# Patient Record
Sex: Female | Born: 1935 | Race: White | Hispanic: No | Smoking: Former smoker
Health system: Southern US, Community
[De-identification: ages and names within clinical notes are randomized; demographics above are authoritative.]

## PROBLEM LIST (undated history)

## (undated) DIAGNOSIS — K5903 Drug induced constipation: Secondary | ICD-10-CM

## (undated) DIAGNOSIS — M545 Low back pain, unspecified: Secondary | ICD-10-CM

## (undated) DIAGNOSIS — I341 Nonrheumatic mitral (valve) prolapse: Secondary | ICD-10-CM

## (undated) DIAGNOSIS — F419 Anxiety disorder, unspecified: Secondary | ICD-10-CM

## (undated) DIAGNOSIS — M549 Dorsalgia, unspecified: Secondary | ICD-10-CM

## (undated) DIAGNOSIS — Z87440 Personal history of urinary (tract) infections: Secondary | ICD-10-CM

## (undated) DIAGNOSIS — R5381 Other malaise: Secondary | ICD-10-CM

## (undated) DIAGNOSIS — W19XXXA Unspecified fall, initial encounter: Secondary | ICD-10-CM

## (undated) DIAGNOSIS — T402X5A Adverse effect of other opioids, initial encounter: Secondary | ICD-10-CM

## (undated) DIAGNOSIS — D126 Benign neoplasm of colon, unspecified: Secondary | ICD-10-CM

## (undated) DIAGNOSIS — R269 Unspecified abnormalities of gait and mobility: Secondary | ICD-10-CM

## (undated) DIAGNOSIS — F32A Depression, unspecified: Secondary | ICD-10-CM

## (undated) DIAGNOSIS — R1310 Dysphagia, unspecified: Secondary | ICD-10-CM

## (undated) DIAGNOSIS — M3500 Sicca syndrome, unspecified: Secondary | ICD-10-CM

## (undated) DIAGNOSIS — I959 Hypotension, unspecified: Secondary | ICD-10-CM

## (undated) DIAGNOSIS — F329 Major depressive disorder, single episode, unspecified: Secondary | ICD-10-CM

## (undated) DIAGNOSIS — E041 Nontoxic single thyroid nodule: Secondary | ICD-10-CM

## (undated) DIAGNOSIS — K5730 Diverticulosis of large intestine without perforation or abscess without bleeding: Secondary | ICD-10-CM

## (undated) DIAGNOSIS — G8929 Other chronic pain: Secondary | ICD-10-CM

## (undated) DIAGNOSIS — F0390 Unspecified dementia without behavioral disturbance: Secondary | ICD-10-CM

## (undated) DIAGNOSIS — I480 Paroxysmal atrial fibrillation: Secondary | ICD-10-CM

## (undated) DIAGNOSIS — R413 Other amnesia: Secondary | ICD-10-CM

## (undated) DIAGNOSIS — Z8679 Personal history of other diseases of the circulatory system: Secondary | ICD-10-CM

## (undated) DIAGNOSIS — Z8719 Personal history of other diseases of the digestive system: Secondary | ICD-10-CM

## (undated) DIAGNOSIS — Z889 Allergy status to unspecified drugs, medicaments and biological substances status: Secondary | ICD-10-CM

## (undated) DIAGNOSIS — J309 Allergic rhinitis, unspecified: Secondary | ICD-10-CM

## (undated) DIAGNOSIS — K219 Gastro-esophageal reflux disease without esophagitis: Secondary | ICD-10-CM

## (undated) DIAGNOSIS — H04129 Dry eye syndrome of unspecified lacrimal gland: Secondary | ICD-10-CM

## (undated) DIAGNOSIS — G629 Polyneuropathy, unspecified: Secondary | ICD-10-CM

## (undated) DIAGNOSIS — M797 Fibromyalgia: Secondary | ICD-10-CM

## (undated) DIAGNOSIS — F341 Dysthymic disorder: Secondary | ICD-10-CM

## (undated) DIAGNOSIS — I10 Essential (primary) hypertension: Secondary | ICD-10-CM

## (undated) DIAGNOSIS — K589 Irritable bowel syndrome without diarrhea: Secondary | ICD-10-CM

## (undated) DIAGNOSIS — E119 Type 2 diabetes mellitus without complications: Secondary | ICD-10-CM

## (undated) DIAGNOSIS — I6389 Other cerebral infarction: Secondary | ICD-10-CM

## (undated) HISTORY — DX: Major depressive disorder, single episode, unspecified: F32.9

## (undated) HISTORY — DX: Personal history of other diseases of the circulatory system: Z86.79

## (undated) HISTORY — DX: Nonrheumatic mitral (valve) prolapse: I34.1

## (undated) HISTORY — DX: Unspecified abnormalities of gait and mobility: R26.9

## (undated) HISTORY — DX: Unspecified dementia, unspecified severity, without behavioral disturbance, psychotic disturbance, mood disturbance, and anxiety: F03.90

## (undated) HISTORY — PX: APPENDECTOMY: SHX54

## (undated) HISTORY — DX: Allergic rhinitis, unspecified: J30.9

## (undated) HISTORY — PX: DENTAL SURGERY: SHX609

## (undated) HISTORY — DX: Low back pain: M54.5

## (undated) HISTORY — PX: CATARACT EXTRACTION, BILATERAL: SHX1313

## (undated) HISTORY — DX: Dorsalgia, unspecified: M54.9

## (undated) HISTORY — DX: Irritable bowel syndrome, unspecified: K58.9

## (undated) HISTORY — DX: Depression, unspecified: F32.A

## (undated) HISTORY — DX: Other amnesia: R41.3

## (undated) HISTORY — DX: Type 2 diabetes mellitus without complications: E11.9

## (undated) HISTORY — PX: SINUS EXPLORATION: SHX5214

## (undated) HISTORY — DX: Paroxysmal atrial fibrillation: I48.0

## (undated) HISTORY — DX: Allergy status to unspecified drugs, medicaments and biological substances: Z88.9

## (undated) HISTORY — DX: Benign neoplasm of colon, unspecified: D12.6

## (undated) HISTORY — DX: Dysthymic disorder: F34.1

## (undated) HISTORY — DX: Nontoxic single thyroid nodule: E04.1

## (undated) HISTORY — DX: Low back pain, unspecified: M54.50

## (undated) HISTORY — DX: Unspecified fall, initial encounter: W19.XXXA

## (undated) HISTORY — DX: Diverticulosis of large intestine without perforation or abscess without bleeding: K57.30

## (undated) HISTORY — DX: Sjogren syndrome, unspecified: M35.00

## (undated) HISTORY — DX: Anxiety disorder, unspecified: F41.9

## (undated) HISTORY — PX: COLONOSCOPY W/ BIOPSIES: SHX1374

## (undated) HISTORY — DX: Fibromyalgia: M79.7

## (undated) HISTORY — DX: Drug induced constipation: K59.03

## (undated) HISTORY — DX: Essential (primary) hypertension: I10

## (undated) HISTORY — DX: Adverse effect of other opioids, initial encounter: T40.2X5A

## (undated) HISTORY — DX: Gastro-esophageal reflux disease without esophagitis: K21.9

## (undated) HISTORY — DX: Dysphagia, unspecified: R13.10

## (undated) HISTORY — DX: Other chronic pain: G89.29

## (undated) HISTORY — DX: Dry eye syndrome of unspecified lacrimal gland: H04.129

## (undated) HISTORY — DX: Other malaise: R53.81

---

## 1965-02-13 HISTORY — PX: ABDOMINAL HYSTERECTOMY: SHX81

## 1978-02-13 HISTORY — PX: NASAL SEPTUM SURGERY: SHX37

## 1984-02-14 HISTORY — PX: TEMPOROMANDIBULAR JOINT SURGERY: SHX35

## 1997-07-09 ENCOUNTER — Ambulatory Visit (HOSPITAL_COMMUNITY): Admission: RE | Admit: 1997-07-09 | Discharge: 1997-07-09 | Payer: Self-pay | Admitting: Cardiology

## 1997-12-11 ENCOUNTER — Ambulatory Visit (HOSPITAL_COMMUNITY): Admission: RE | Admit: 1997-12-11 | Discharge: 1997-12-11 | Payer: Self-pay | Admitting: Gastroenterology

## 1997-12-11 ENCOUNTER — Encounter: Payer: Self-pay | Admitting: Gastroenterology

## 1998-02-11 ENCOUNTER — Inpatient Hospital Stay (HOSPITAL_COMMUNITY): Admission: EM | Admit: 1998-02-11 | Discharge: 1998-02-19 | Payer: Self-pay

## 1998-02-11 ENCOUNTER — Encounter: Payer: Self-pay | Admitting: Internal Medicine

## 1998-02-13 HISTORY — PX: CHOLECYSTECTOMY: SHX55

## 1998-02-16 ENCOUNTER — Encounter: Payer: Self-pay | Admitting: Internal Medicine

## 1998-08-23 ENCOUNTER — Ambulatory Visit (HOSPITAL_COMMUNITY): Admission: RE | Admit: 1998-08-23 | Discharge: 1998-08-23 | Payer: Self-pay | Admitting: Cardiology

## 1998-08-23 ENCOUNTER — Encounter: Payer: Self-pay | Admitting: Cardiology

## 1999-02-14 HISTORY — PX: TRANSTHORACIC ECHOCARDIOGRAM: SHX275

## 1999-05-27 ENCOUNTER — Inpatient Hospital Stay (HOSPITAL_COMMUNITY): Admission: EM | Admit: 1999-05-27 | Discharge: 1999-06-08 | Payer: Self-pay | Admitting: Emergency Medicine

## 1999-05-27 ENCOUNTER — Encounter: Payer: Self-pay | Admitting: Emergency Medicine

## 1999-05-30 ENCOUNTER — Encounter: Payer: Self-pay | Admitting: Cardiology

## 2000-02-13 ENCOUNTER — Encounter: Admission: RE | Admit: 2000-02-13 | Discharge: 2000-03-15 | Payer: Self-pay | Admitting: Orthopedic Surgery

## 2000-02-14 DIAGNOSIS — D126 Benign neoplasm of colon, unspecified: Secondary | ICD-10-CM

## 2000-02-14 HISTORY — DX: Benign neoplasm of colon, unspecified: D12.6

## 2000-11-01 ENCOUNTER — Encounter (INDEPENDENT_AMBULATORY_CARE_PROVIDER_SITE_OTHER): Payer: Self-pay | Admitting: Gastroenterology

## 2000-11-01 ENCOUNTER — Other Ambulatory Visit: Admission: RE | Admit: 2000-11-01 | Discharge: 2000-11-01 | Payer: Self-pay | Admitting: Gastroenterology

## 2000-11-01 ENCOUNTER — Encounter (INDEPENDENT_AMBULATORY_CARE_PROVIDER_SITE_OTHER): Payer: Self-pay | Admitting: Specialist

## 2000-12-19 ENCOUNTER — Encounter: Payer: Self-pay | Admitting: Gastroenterology

## 2000-12-19 ENCOUNTER — Encounter: Admission: RE | Admit: 2000-12-19 | Discharge: 2000-12-19 | Payer: Self-pay | Admitting: Gastroenterology

## 2001-02-13 HISTORY — PX: NM MYOCAR PERF WALL MOTION: HXRAD629

## 2001-03-28 ENCOUNTER — Ambulatory Visit (HOSPITAL_COMMUNITY): Admission: RE | Admit: 2001-03-28 | Discharge: 2001-03-28 | Payer: Self-pay | Admitting: Gastroenterology

## 2001-03-28 ENCOUNTER — Encounter: Payer: Self-pay | Admitting: Gastroenterology

## 2001-11-26 ENCOUNTER — Other Ambulatory Visit: Admission: RE | Admit: 2001-11-26 | Discharge: 2001-11-26 | Payer: Self-pay | Admitting: Obstetrics & Gynecology

## 2002-06-30 ENCOUNTER — Encounter: Payer: Self-pay | Admitting: Pulmonary Disease

## 2002-06-30 ENCOUNTER — Ambulatory Visit (HOSPITAL_COMMUNITY): Admission: RE | Admit: 2002-06-30 | Discharge: 2002-06-30 | Payer: Self-pay | Admitting: Pulmonary Disease

## 2003-02-17 ENCOUNTER — Ambulatory Visit (HOSPITAL_COMMUNITY): Admission: RE | Admit: 2003-02-17 | Discharge: 2003-02-17 | Payer: Self-pay | Admitting: Cardiology

## 2003-02-17 HISTORY — PX: CARDIAC CATHETERIZATION: SHX172

## 2003-09-09 ENCOUNTER — Ambulatory Visit (HOSPITAL_COMMUNITY): Admission: RE | Admit: 2003-09-09 | Discharge: 2003-09-09 | Payer: Self-pay | Admitting: Pulmonary Disease

## 2003-12-17 ENCOUNTER — Other Ambulatory Visit: Admission: RE | Admit: 2003-12-17 | Discharge: 2003-12-17 | Payer: Self-pay | Admitting: Obstetrics & Gynecology

## 2004-02-22 ENCOUNTER — Ambulatory Visit: Payer: Self-pay | Admitting: Pulmonary Disease

## 2004-02-26 ENCOUNTER — Ambulatory Visit: Payer: Self-pay | Admitting: Gastroenterology

## 2004-03-16 ENCOUNTER — Ambulatory Visit: Payer: Self-pay | Admitting: Gastroenterology

## 2004-03-24 ENCOUNTER — Ambulatory Visit (HOSPITAL_COMMUNITY): Admission: RE | Admit: 2004-03-24 | Discharge: 2004-03-24 | Payer: Self-pay | Admitting: Gastroenterology

## 2004-03-24 ENCOUNTER — Encounter (INDEPENDENT_AMBULATORY_CARE_PROVIDER_SITE_OTHER): Payer: Self-pay | Admitting: Gastroenterology

## 2004-04-04 ENCOUNTER — Ambulatory Visit: Payer: Self-pay | Admitting: Pulmonary Disease

## 2004-11-16 ENCOUNTER — Ambulatory Visit: Payer: Self-pay | Admitting: Pulmonary Disease

## 2005-07-18 ENCOUNTER — Ambulatory Visit: Payer: Self-pay | Admitting: Pulmonary Disease

## 2005-08-15 ENCOUNTER — Ambulatory Visit: Payer: Self-pay | Admitting: Pulmonary Disease

## 2005-10-19 ENCOUNTER — Ambulatory Visit: Payer: Self-pay | Admitting: Pulmonary Disease

## 2005-11-29 ENCOUNTER — Ambulatory Visit: Payer: Self-pay | Admitting: Gastroenterology

## 2006-01-16 ENCOUNTER — Ambulatory Visit: Payer: Self-pay | Admitting: Pulmonary Disease

## 2006-03-20 ENCOUNTER — Ambulatory Visit: Payer: Self-pay | Admitting: Pulmonary Disease

## 2006-03-21 ENCOUNTER — Ambulatory Visit: Payer: Self-pay | Admitting: Pulmonary Disease

## 2006-03-21 LAB — CONVERTED CEMR LAB
ALT: 15 units/L (ref 0–40)
AST: 18 units/L (ref 0–37)
Albumin: 3.5 g/dL (ref 3.5–5.2)
CO2: 34 meq/L — ABNORMAL HIGH (ref 19–32)
Calcium: 9.6 mg/dL (ref 8.4–10.5)
Cholesterol: 170 mg/dL (ref 0–200)
Creatinine, Ser: 0.9 mg/dL (ref 0.4–1.2)
GFR calc non Af Amer: 65 mL/min
HDL: 49.7 mg/dL (ref >39.0)
LDL Cholesterol: 85 mg/dL (ref 0–99)
Lymphocytes Relative: 30 % (ref 12.0–46.0)
MCV: 89.4 fL (ref 78.0–100.0)
Neutro Abs: 3.8 10*3/uL (ref 1.4–7.7)
Neutrophils Relative %: 59.8 % (ref 43.0–77.0)
RBC: 4.15 M/uL (ref 3.87–5.11)
Sodium: 141 meq/L (ref 135–145)
TSH: 1.98 microintl units/mL (ref 0.35–5.50)
Total Protein: 7.3 g/dL (ref 6.0–8.3)
VLDL: 35 mg/dL (ref 0–40)
WBC: 6.3 10*3/uL (ref 4.5–10.5)

## 2006-05-25 ENCOUNTER — Encounter: Admission: RE | Admit: 2006-05-25 | Discharge: 2006-05-25 | Payer: Self-pay | Admitting: Pediatrics

## 2007-01-07 ENCOUNTER — Ambulatory Visit: Payer: Self-pay | Admitting: Pulmonary Disease

## 2007-06-28 ENCOUNTER — Telehealth (INDEPENDENT_AMBULATORY_CARE_PROVIDER_SITE_OTHER): Payer: Self-pay | Admitting: *Deleted

## 2007-07-02 ENCOUNTER — Encounter: Payer: Self-pay | Admitting: Adult Health

## 2007-07-02 ENCOUNTER — Ambulatory Visit: Payer: Self-pay | Admitting: Internal Medicine

## 2007-07-02 DIAGNOSIS — I10 Essential (primary) hypertension: Secondary | ICD-10-CM | POA: Insufficient documentation

## 2007-07-02 DIAGNOSIS — K589 Irritable bowel syndrome without diarrhea: Secondary | ICD-10-CM | POA: Insufficient documentation

## 2007-07-02 DIAGNOSIS — G589 Mononeuropathy, unspecified: Secondary | ICD-10-CM | POA: Insufficient documentation

## 2007-07-02 DIAGNOSIS — M797 Fibromyalgia: Secondary | ICD-10-CM | POA: Insufficient documentation

## 2007-07-02 DIAGNOSIS — M545 Low back pain, unspecified: Secondary | ICD-10-CM | POA: Insufficient documentation

## 2007-07-02 DIAGNOSIS — K219 Gastro-esophageal reflux disease without esophagitis: Secondary | ICD-10-CM | POA: Insufficient documentation

## 2007-07-02 DIAGNOSIS — F341 Dysthymic disorder: Secondary | ICD-10-CM | POA: Insufficient documentation

## 2007-07-04 LAB — CONVERTED CEMR LAB
AST: 20 units/L (ref 0–37)
BUN: 12 mg/dL (ref 6–23)
Basophils Absolute: 0.1 10*3/uL (ref 0.0–0.1)
CO2: 30 meq/L (ref 19–32)
Chloride: 101 meq/L (ref 96–112)
GFR calc non Af Amer: 74 mL/min
HCT: 37 % (ref 36.0–46.0)
Hemoglobin: 12.7 g/dL (ref 12.0–15.0)
MCHC: 34.4 g/dL (ref 30.0–36.0)
Monocytes Absolute: 0.6 10*3/uL (ref 0.1–1.0)
Monocytes Relative: 6.7 % (ref 3.0–12.0)
Neutro Abs: 5.5 10*3/uL (ref 1.4–7.7)
Neutrophils Relative %: 64.3 % (ref 43.0–77.0)
Platelets: 265 10*3/uL (ref 150–400)
RBC: 4.07 M/uL (ref 3.87–5.11)
Sodium: 135 meq/L (ref 135–145)
Total Bilirubin: 0.5 mg/dL (ref 0.3–1.2)

## 2007-11-04 ENCOUNTER — Ambulatory Visit: Payer: Self-pay | Admitting: Pulmonary Disease

## 2007-11-05 DIAGNOSIS — I059 Rheumatic mitral valve disease, unspecified: Secondary | ICD-10-CM | POA: Insufficient documentation

## 2007-11-05 DIAGNOSIS — R413 Other amnesia: Secondary | ICD-10-CM | POA: Insufficient documentation

## 2007-11-05 DIAGNOSIS — T887XXA Unspecified adverse effect of drug or medicament, initial encounter: Secondary | ICD-10-CM | POA: Insufficient documentation

## 2007-12-02 ENCOUNTER — Telehealth (INDEPENDENT_AMBULATORY_CARE_PROVIDER_SITE_OTHER): Payer: Self-pay | Admitting: *Deleted

## 2008-01-09 ENCOUNTER — Emergency Department (HOSPITAL_BASED_OUTPATIENT_CLINIC_OR_DEPARTMENT_OTHER): Admission: EM | Admit: 2008-01-09 | Discharge: 2008-01-09 | Payer: Self-pay | Admitting: Emergency Medicine

## 2008-01-15 ENCOUNTER — Encounter: Payer: Self-pay | Admitting: Pulmonary Disease

## 2008-02-09 ENCOUNTER — Ambulatory Visit: Payer: Self-pay | Admitting: Diagnostic Radiology

## 2008-02-09 ENCOUNTER — Emergency Department (HOSPITAL_BASED_OUTPATIENT_CLINIC_OR_DEPARTMENT_OTHER): Admission: EM | Admit: 2008-02-09 | Discharge: 2008-02-09 | Payer: Self-pay | Admitting: Emergency Medicine

## 2008-02-11 ENCOUNTER — Encounter: Admission: RE | Admit: 2008-02-11 | Discharge: 2008-02-11 | Payer: Self-pay | Admitting: Pediatrics

## 2008-02-18 ENCOUNTER — Telehealth (INDEPENDENT_AMBULATORY_CARE_PROVIDER_SITE_OTHER): Payer: Self-pay | Admitting: *Deleted

## 2008-03-03 ENCOUNTER — Ambulatory Visit: Payer: Self-pay | Admitting: Pulmonary Disease

## 2008-04-15 ENCOUNTER — Ambulatory Visit: Payer: Self-pay | Admitting: Internal Medicine

## 2008-04-15 DIAGNOSIS — Z860100 Personal history of colon polyps, unspecified: Secondary | ICD-10-CM | POA: Insufficient documentation

## 2008-04-16 ENCOUNTER — Telehealth: Payer: Self-pay | Admitting: Internal Medicine

## 2008-04-22 ENCOUNTER — Encounter: Payer: Self-pay | Admitting: Pulmonary Disease

## 2008-05-06 ENCOUNTER — Telehealth: Payer: Self-pay | Admitting: Internal Medicine

## 2008-05-20 ENCOUNTER — Telehealth: Payer: Self-pay | Admitting: Internal Medicine

## 2008-06-01 ENCOUNTER — Ambulatory Visit: Payer: Self-pay | Admitting: Internal Medicine

## 2008-06-08 ENCOUNTER — Ambulatory Visit: Payer: Self-pay | Admitting: Internal Medicine

## 2008-06-09 ENCOUNTER — Telehealth: Payer: Self-pay | Admitting: Internal Medicine

## 2008-09-01 ENCOUNTER — Telehealth: Payer: Self-pay | Admitting: Pulmonary Disease

## 2008-09-03 ENCOUNTER — Telehealth: Payer: Self-pay | Admitting: Pulmonary Disease

## 2009-01-06 ENCOUNTER — Ambulatory Visit: Payer: Self-pay | Admitting: Pulmonary Disease

## 2009-01-06 DIAGNOSIS — N3941 Urge incontinence: Secondary | ICD-10-CM | POA: Insufficient documentation

## 2009-01-12 ENCOUNTER — Encounter: Payer: Self-pay | Admitting: Pulmonary Disease

## 2009-02-24 ENCOUNTER — Encounter: Payer: Self-pay | Admitting: Pulmonary Disease

## 2009-03-22 ENCOUNTER — Encounter: Payer: Self-pay | Admitting: Pulmonary Disease

## 2009-04-08 ENCOUNTER — Telehealth (INDEPENDENT_AMBULATORY_CARE_PROVIDER_SITE_OTHER): Payer: Self-pay | Admitting: *Deleted

## 2009-07-06 ENCOUNTER — Ambulatory Visit: Payer: Self-pay | Admitting: Pulmonary Disease

## 2009-07-06 DIAGNOSIS — M3500 Sicca syndrome, unspecified: Secondary | ICD-10-CM | POA: Insufficient documentation

## 2009-07-06 DIAGNOSIS — H04129 Dry eye syndrome of unspecified lacrimal gland: Secondary | ICD-10-CM | POA: Insufficient documentation

## 2009-09-02 ENCOUNTER — Encounter: Payer: Self-pay | Admitting: Pulmonary Disease

## 2009-10-08 ENCOUNTER — Telehealth: Payer: Self-pay | Admitting: Pulmonary Disease

## 2009-10-26 ENCOUNTER — Encounter: Payer: Self-pay | Admitting: Pulmonary Disease

## 2009-11-05 ENCOUNTER — Encounter: Payer: Self-pay | Admitting: Pulmonary Disease

## 2009-11-25 ENCOUNTER — Telehealth (INDEPENDENT_AMBULATORY_CARE_PROVIDER_SITE_OTHER): Payer: Self-pay | Admitting: *Deleted

## 2009-12-03 ENCOUNTER — Encounter: Payer: Self-pay | Admitting: Pulmonary Disease

## 2010-01-03 ENCOUNTER — Ambulatory Visit: Payer: Self-pay | Admitting: Pulmonary Disease

## 2010-01-05 ENCOUNTER — Telehealth: Payer: Self-pay | Admitting: Pulmonary Disease

## 2010-03-15 NOTE — Assessment & Plan Note (Signed)
Summary: 6 months/apc   Primary Care Provider:  Alroy Dust, MD  CC:  6 month ROV & review of mult medical problems....  History of Present Illness: 77 y/o WF here for a follow up visit... he has multiple medical problems as noted below...    ~  Sep09:  she reports recent problems w/ her eyes and eval by DrHecker for Ophthal... s/p cataract surg 8/09 w/ complications-?Rosacea of eye & needed tubes in tear ducts, on several eye drops etc...   ~  Jan10:  she fell at home 12/27 (slipped on bathroom floor) and fractured her left thumb & bruised her face... seen at [facility] and then DrRendall w/ wrist/ thumb splint... this fall has exas her fibromyalgia to a degree but she is getting back to her baseline... she also saw DrLittle/ [facility] for yearly f/u MVP/ palpit/ mult med intolerances... she has not had the Seasonal flu shot, and refuses the H1N1 vaccine that is currently avail...  ~  Nov10:  she is here by herself today- daughter does her meds and she didn't bring list or bottles... she saw DrGessner 4/10 w/ constip- colonoscopy showed mod divertics, hems, & melanosis... he rec Miralax/ Senakot-S and improved... she has mult somatic complaints- ocular rosacea Dx by DrHecker & Tafeen... had bilat cat surg, now dry eye symptoms & she has upcoming appt w/ [facility] soon for 2nd opinion... she's noted LBP & saw DrJones prev (she will f/u w/ DrMortenson)... also notes urinary incontinence & she will f/u w/ DrNeal for this...   ~  Jul 06, 2009:  she recently saw DrDeveshwar for Rheum w/ extensive lab work done & Dx w/ Sjogren's syndrome/ autoimmune disease... her note is pending but pt indicates new Rx w/ Pilocarpine 5mg Bid, and considering Plaquenil rx...  she saw DrHecker 2/11 & DrLittle 1/11 - see below... she is c/o incr neuropathy symptoms in her legs and wonders what can be done for this> she is on Neurontin from DrHickling at 100mg Tid & we discussed incr to 300mg Bid...   Current Problem  List:  SJOGREN'S SYNDROME (ICD-710.2), & DRY EYE SYNDROME (ICD-375.15) - eval by DrHecker & DrDeveshwar 5/11 w/ extensive lab work done & Dx of Sjogren's syndrome & started on Pilocarpine 5mg Bid... DrHecker has her on a number of eye drops, & prev thought to be dry eyes w/ ocular rosacea & tubes placed in tear ducts...  CHRONIC ORAL DISCOMFORT - this has been looked into by mult physicians and no etiology discovered previously (there was a component of sicca syndrome)... she uses Magic Mouthwash Prn + sialogogues...  HYPERTENSION, BENIGN (ICD-401.1) - controlled on TOPROL XL 50mg - 1/2 tabBid now,  & DILTIAZEM CD 180mg /d...  BP= 110/78 today, takes meds regularly & tol well... denies CP, worsening palipit, syncope, dyspnea, edema, etc...  MITRAL VALVE PROLAPSE (ICD-424.0) - on ASA 81mg /d, & LANOXIN 0.125mg /d and followed by DrLittle for Cardiology... his notes are reviewed> seen 1/11 for her MVP, HBP, hx palpit,neg cath- Metoprolol incr to 25Bid.  ~  cath 1/05 by DrLittle showed clean coronaries & norm LVF w/ +MVP...  GERD (ICD-530.81) - on PREVACID 30mg /d... denies reflux symptoms, swallowing OK x for dryness, etc...  ~  last EGD 9/02 showed 4cmHH, GERD...  DIVERTICULOSIS OF COLON (ICD-562.10),  IRRITABLE BOWEL SYNDROME (ICD-564.1), & Family Hx of COLON CANCER (ICD-153.9) - followed for GI by DrSamLeBauer- now DrGessner- colonoscopy 2/06 showed a tortuous and spastic colon, divertics, otherw neg... her sister died in her 53's w/ colon cancer... f/u colonoscopy 4/10  showed mod divertics, hems, melanosis> Rx'd w/ Miralax/ Senakot-S.  URINARY INCONTINENCE (ICD-788.30) - eval by GYN= DrRNeal (on Premarin 0.9mg /d)...  LOW BACK PAIN SYNDROME (ICD-724.2) - on VICODIN up to 3/ day... Ortho= DrMortenson...  FIBROMYALGIA (ICD-729.1) - she has chr fatigue symptoms and diffuse aches & pains... on CYCLOBENZAPRINE 10mg - 1/2-1 tab Prn...  MEMORY LOSS (ICD-780.93) - eval by DrHickling for Neurology w/ MRI  done- pt states "it's not dementia, just hardening of the arteries"... his note from 4/08 indicates- OBS, gait abn & neck/ Back pain diagnoses...  NEUROPATHY (ICD-355.9) - on GABAPENTIN 100mg Tid per DrHickling, Neurology... she notes incr neuropathic symptoms and we decided to try incr NEURONTIN to 300mg Bid...  ANXIETY DEPRESSION (ICD-300.4) - she take ALPRAZOLAM 0.5mg Bid as needed... prev Psychiatric Rx from DrCottle but she hasn't seen him in >69yr...  Hx of ADVERSE DRUG REACTION (ICD-995.20) - hx of mult drug allergies and intolerances including:  PCN, Sulfa, Doxy, NSAIDs, Demerol, Codeine, DCN100, Tramadol, Phenergan, Thorazine, Stelazine...  NOTE: she takes Vicodin for pain, and tolerates this satis...  Health Maintenance - GYN= DrNeal on Premarin, Calcium, Vits, & Fish Oil...   Allergies: 1)  ! Penicillin 2)  ! * Stelazine 3)  ! * Phenothazine 4)  ! * Dolonil 5)  ! Septra 6)  ! Tolectin 7)  ! Naprosyn 8)  ! Doxycycline 9)  ! * Tramadol 10)  ! Darvon 11)  ! Codeine 12)  ! Demerol 13)  ! Tylox 14)  ! * Northex 15)  Amitiza (Lubiprostone)  Comments:  Nurse/Medical Assistant: The patient's medications and allergies were reviewed with the patient and were updated in the Medication and Allergy Lists.  Past History:  Past Medical History: SJOGREN'S SYNDROME (ICD-710.2) DRY EYE SYNDROME (ICD-375.15) * CHRONIC ORAL DISCOMFORT HYPERTENSION, BENIGN (ICD-401.1) MITRAL VALVE PROLAPSE (ICD-424.0) GERD (ICD-530.81) DIVERTICULOSIS OF COLON (ICD-562.10) IRRITABLE BOWEL SYNDROME (ICD-564.1) Family Hx of CARCINOMA, COLON, FAMILY HX (ICD-V16.0) URINARY INCONTINENCE (ICD-788.30) LOW BACK PAIN SYNDROME (ICD-724.2) FIBROMYALGIA (ICD-729.1) MEMORY LOSS (ICD-780.93) NEUROPATHY (ICD-355.9) ANXIETY DEPRESSION (ICD-300.4) Hx of ADVERSE DRUG REACTION (ICD-995.20)  Past Surgical History: S/P hysterectomy S/P appendectomy S/P cholecystectomy S/P mandible/ TMJ surgery right and left  cataract extractions  Family History: Reviewed history from 01/06/2009 and no changes required. Mother died age 54 due to heart problems Father died age 71 due to complications of pneumonia and MI Sister died age 38 due to cancer Brother alive at age 2 Brother deceased from cancer  Social History: Reviewed history from 01/06/2009 and no changes required. Widow 2 Children Min smoking hx (in her 79s) No alcohol Not exercising Retired  Review of Systems      See HPI       The patient complains of decreased hearing and dyspnea on exertion.  The patient denies anorexia, fever, weight loss, weight gain, vision loss, hoarseness, chest pain, syncope, peripheral edema, prolonged cough, headaches, hemoptysis, abdominal pain, melena, hematochezia, severe indigestion/heartburn, hematuria, incontinence, muscle weakness, suspicious skin lesions, transient blindness, difficulty walking, depression, unusual weight change, abnormal bleeding, enlarged lymph nodes, and angioedema.    Vital Signs:  Patient profile:   77 year old female Height:      63 inches Weight:      118 pounds BMI:     20.98 O2 Sat:      97 % on Room air Temp:     97.0 degrees F oral Pulse rate:   72 / minute BP sitting:   110 / 78  (right arm) Cuff size:   regular  Vitals  Entered By: Randell Loop CMA (Jul 06, 2009 2:22 PM)  O2 Sat at Rest %:  97 O2 Flow:  Room air CC: 6 month ROV & review of mult medical problems... Is Patient Diabetic? No Pain Assessment Patient in pain? no      Comments meds updated today per pt   Physical Exam  Additional Exam:  WD, WN, 77 y/o WF in NAD... she has dry eyes & prominent dry mouth symptoms... GENERAL:  Alert & oriented; pleasant & cooperative...  HEENT:  [place]/AT, EOM-wnl, PERRLA, EACs-clear, TMs-wnl, NOSE-clear, THROAT-clear & excessively dry. NECK:  Supple w/ fairROM; no JVD; normal carotid impulses w/o bruits; no thyromegaly or nodules palpated; no lymphadenopathy. CHEST:   Clear to P & A; without wheezes/ rales/ or rhonchi. HEART:  Regular Rhythm; without murmurs/ rubs/ or gallops heard... ABDOMEN:  Soft & nontender; normal bowel sounds; no organomegaly or masses detected. EXT: without deformities, mild arthritic changes; no varicose veins/ venous insuffic/ or edema. NEURO:  CN's intact;  no focal neuro deficits... DERM:  No lesions noted; no rash etc...    MISC. Report  Procedure date:  07/06/2009  Findings:      DATA REVIEWED:   ~  Note from DrLittle yearly OV assessment 1/11 is reviewed...  ~  Note from [facility] 2/11 reviewed...  ~  We are pending review of note from DrDeveshwar re: Sjogren's.   Impression & Recommendations:  Problem # 1:  SJOGREN'S SYNDROME (ICD-710.2) She has been Dx w/ Sjogren's sicca syndrome w/ dry eyes, dry mouth, and evid autoimmune dis per pt's report from DrDeveshwar (note pending)... she has been started on Pilocarpine 5mg Bid, along w/ numerous eye drops from DrHecker... she will maintain f/u w/ these physicians...  Problem # 2:  HYPERTENSION, BENIGN (ICD-401.1) Controlled>  continue same meds. Her updated medication list for this problem includes:    Toprol Xl 50 Mg Tb24 (Metoprolol succinate) .Marland Kitchen... 1/2 tab by mouth two times a day...    Cartia Xt 180 Mg Cp24 (Diltiazem hcl coated beads) .Marland Kitchen... Take 1 tablet by mouth once a day  Problem # 3:  MITRAL VALVE PROLAPSE (ICD-424.0) Followed by DrLittle>  1/11 note reviewed... Her updated medication list for this problem includes:    Bayer Aspirin Ec Low Dose 81 Mg Tbec (Aspirin) .Marland Kitchen... Take 1 tablet by mouth once a day    Toprol Xl 50 Mg Tb24 (Metoprolol succinate) .Marland Kitchen... 1/2 tab by mouth two times a day...  Problem # 4:  GERD (ICD-530.81) Stable on the Prevacid... Her updated medication list for this problem includes:    Prevacid 30 Mg Cpdr (Lansoprazole) .Marland Kitchen... Take 1 tablet by mouth once a day  Problem # 5:  DIVERTICULOSIS OF COLON (ICD-562.10) She has divertics, IBS,  +FamHx colon Ca andf she is up to date on screening...  Problem # 6:  FIBROMYALGIA (ICD-729.1) She has hx FM, LBP syndrome>  stable on these meds... Her updated medication list for this problem includes:    Bayer Aspirin Ec Low Dose 81 Mg Tbec (Aspirin) .Marland Kitchen... Take 1 tablet by mouth once a day    Vicodin 5-500 Mg Tabs (Hydrocodone-acetaminophen) .Marland Kitchen... 1 every 4-6 hr as needed    Cyclobenzaprine Hcl 10 Mg Tabs (Cyclobenzaprine hcl) .Marland Kitchen... Take 1/2 - 1 tab by mouth four times per day as needed  Problem # 7:  NEUROPATHY (ICD-355.9) She is c/o incr neuropathy symptoms and we discussed trial of incr NEURONTIN Rx... try 300mg Bid.  Problem # 8:  ANXIETY DEPRESSION (ICD-300.4) She states  that she is stable on the Alprazolam...  Problem # 9:  OTHER MEDICAL PROBLEMS AS NOTED>>>  Complete Medication List: 1)  Restasis 0.05 % Emul (Cyclosporine) .... As directed 2)  Refresh Tears 0.5 % Soln (Carboxymethylcellulose sodium) .... Use one drop in each eye two times a day 3)  Magic Mouthwash  .... One teaspoon gargle and swallow four times a day as needed 4)  Bayer Aspirin Ec Low Dose 81 Mg Tbec (Aspirin) .... Take 1 tablet by mouth once a day 5)  Toprol Xl 50 Mg Tb24 (Metoprolol succinate) .... 1/2 tab by mouth two times a day... 6)  Cartia Xt 180 Mg Cp24 (Diltiazem hcl coated beads) .... Take 1 tablet by mouth once a day 7)  Digoxin 0.125 Mg Tabs (Digoxin) .... Take 1 tablet by mouth once a day 8)  Prevacid 30 Mg Cpdr (Lansoprazole) .... Take 1 tablet by mouth once a day 9)  Miralax Powd (Polyethylene glycol 3350) .... Take 1 capful in water two times a day 10)  Senokot S 8.6-50 Mg Tabs (Sennosides-docusate sodium) .Marland Kitchen.. 1 by mouth once daily 11)  Premarin 0.9 Mg Tabs (Estrogens conjugated) .... Take 1 tablet by mouth once a day 12)  Caltrate 600+d Plus 600-400 Mg-unit Tabs (Calcium carbonate-vit d-min) .... 2 tabs by mouth once daily 13)  Multivitamins Tabs (Multiple vitamin) .... Take 1 tablet by mouth  once a day 14)  Vicodin 5-500 Mg Tabs (Hydrocodone-acetaminophen) .Marland Kitchen.. 1 every 4-6 hr as needed 15)  Gabapentin 300 Mg Caps (Gabapentin) .... Take 1 cap by mouth two times a day... 16)  Cyclobenzaprine Hcl 10 Mg Tabs (Cyclobenzaprine hcl) .... Take 1/2 - 1 tab by mouth four times per day as needed 17)  Alprazolam 0.5 Mg Tabs (Alprazolam) .Marland Kitchen.. 1 by mouth two times a day as needed 18)  Fish Oil 1000 Mg Caps (Omega-3 fatty acids) .... Take 1 tablet by mouth once a day 19)  Pilocarpine Hcl 5 Mg Tabs (Pilocarpine hcl) .... Take one tablet by mouth two times a day as directed by dr. Corliss Skains  Patient Instructions: 1)  Today we updated your med list- see below.... 2)  Continue your current meds the same... 3)  Today we increased your GABAPENTIN to 300mg  twice daily to help your neuropathy symptoms.Marland KitchenMarland Kitchen 4)  Continue your new medication & follow up w/ DrDeveshwar... 5)  Call if we can be of assisst... 6)  Please schedule a follow-up appointment in 6 months. Prescriptions: GABAPENTIN 300 MG CAPS (GABAPENTIN) take 1 cap by mouth two times a day...  #60 x 11   Entered and Authorized by:   [name] MD   Signed by:   [name] MD on 07/06/2009   Method used:   Print then Give to Patient   RxID:   [id number] GABAPENTIN 300 MG CAPS (GABAPENTIN) take 1 cap by mouth two times a day...  #60 x 11   Entered and Authorized by:   [name] MD   Signed by:   [name] MD on 07/06/2009   Method used:   Electronically to        [facility] [phone]* (retail)       [address]       [place], [place]  [address]       Ph: [phone] or [phone]       Fax: [phone]   RxID:   [id number]    Immunization History:  Influenza Immunization History:  Influenza:  historical (12/30/2008)

## 2010-03-15 NOTE — Progress Notes (Signed)
Summary: appt  Phone Note From Other Clinic Call back at [phone]  Amber   Caller: Dr. Delrae Sawyers - Opthamologist Call For: Kriste Basque Summary of Call: Dr. Elmer Picker wanted you to see and evaluate this pt for Sjogern's.  Faxing info over to SN. Initial call taken by: Eugene Gavia,  April 08, 2009 2:04 PM  Follow-up for Phone Call        pls give to sn to call dr hecker Follow-up by: Philipp Deputy CMA,  April 08, 2009 3:04 PM  Additional Follow-up for Phone Call Additional follow up Details #1::        per SN---this eval should be done by a rheumatologist   please refer to Dr. Corliss Skains for eval..  called and spoke with amber and she is aware per SN that pt needs eval by rheumatologist. Randell Loop CMA  April 08, 2009 4:51 PM

## 2010-03-15 NOTE — Miscellaneous (Signed)
Summary: Certification & Plan of Care / [facility]  Certification & Plan of Care / [facility]   Imported By: Lennie Odor 11/11/2009 10:45:08  _____________________________________________________________________  External Attachment:    Type:   Image     Comment:   External Document

## 2010-03-15 NOTE — Letter (Signed)
Summary: [name]  [facility]   Imported By: Lester [place] 04/23/2009 09:27:06  _____________________________________________________________________  External Attachment:    Type:   Image     Comment:   External Document

## 2010-03-15 NOTE — Letter (Signed)
Summary: [facility]  [facility]   Imported By: Lester [place] 03/05/2009 08:32:02  _____________________________________________________________________  External Attachment:    Type:   Image     Comment:   External Document

## 2010-03-15 NOTE — Miscellaneous (Signed)
Summary: [facility] Order / [facility]  [facility] Order / [facility]   Imported By: Lennie Odor 10/28/2009 14:37:04  _____________________________________________________________________  External Attachment:    Type:   Image     Comment:   External Document

## 2010-03-15 NOTE — Letter (Signed)
Summary: [facility]  [facility]   Imported By: Sherian Rein 09/15/2009 08:50:09  _____________________________________________________________________  External Attachment:    Type:   Image     Comment:   External Document

## 2010-03-15 NOTE — Progress Notes (Signed)
Summary: prescript  Phone Note Call from Patient Call back at [phone]   Caller: Daughter Deseree Call For: nadel Summary of Call: need  hydrocodon acetaminophen 5/500mg  refilled [facility] [address] Initial call taken by: Rickard Patience,  November 25, 2009 3:33 PM  Follow-up for Phone Call        Rx refill was recieved by fax and okayed x 1 only and sent back to [facility] [address] via fax.  Spoke with Daughter and made aware. Follow-up by: Vernie Murders,  November 25, 2009 3:42 PM     Appended Document: prescript     Clinical Lists Changes  Medications: Rx of VICODIN 5-500 MG  TABS (HYDROCODONE-ACETAMINOPHEN) 1 every 4-6 hr as needed;  #60 x 0;  Signed;  Entered by: Vernie Murders;  Authorized by: [name] MD;  Method used: Historical    Prescriptions: VICODIN 5-500 MG  TABS (HYDROCODONE-ACETAMINOPHEN) 1 every 4-6 hr as needed  #60 x 0   Entered by:   Vernie Murders   Authorized by:   [name] MD   Signed by:   Vernie Murders on 11/25/2009   Method used:   Historical   RxID:   [id number]

## 2010-03-15 NOTE — Assessment & Plan Note (Signed)
Summary: 6 months/apc   Primary Care Provider:  Alroy Dust, MD  CC:  6 month ROV & review of mult medical problems....  History of Present Illness: 77 y/o WF here for a follow up visit... he has multiple medical problems as noted below...    ~  Jul 06, 2009:  she recently saw DrDeveshwar for Rheum w/ extensive lab work done & Dx w/ Sjogren's syndrome/ autoimmune disease... her note is pending but pt indicates new Rx w/ Pilocarpine 5mg Bid, and considering Plaquenil rx...  she saw DrHecker 2/11 & DrLittle 1/11 - see below... she is c/o incr neuropathy symptoms in her legs and wonders what can be done for this> she is on Neurontin from DrHickling at 100mg Tid & we discussed incr to 300mg Bid...   ~  January 03, 2010:  she states she was dx w/ "autoimmune disease" by Rheum DrDeveshwar & started on Plaquenil 200mg /d now- notes from 7/11 & 10/11 reviewed... c/o abd bloating/ gas treated by GI DrGessner w/ Prevacid, Miralax, Senakot-S, OTC Simethacone (Mylicon/ Phazyme)- we discussed trial Bentyl for cramping... BP remains controlled;  denies CP, palpit, ch in SOB, etc;  notes persistant back discomfort. aches/ pains & wants Vicodin refilled;  she remains on Neurontin for her ?neuropathy, and Alpraz for nerves...   Current Problem List:  SJOGREN'S SYNDROME (ICD-710.2), & DRY EYE SYNDROME (ICD-375.15) - eval by DrHecker & DrDeveshwar 5/11 w/ extensive lab work done & Dx of Sjogren's syndrome & started on Pilocarpine 5mg Bid... DrHecker has her on a number of eye drops, & prev thought to be dry eyes w/ ocular rosacea & tubes placed in tear ducts... also seen by Mikey Kirschner...   ~  10/11:  DrDeveshwar started PLAQUENIL 200mg /d...  CHRONIC ORAL DISCOMFORT - this has been looked into by mult physicians and no other etiology discovered (other than her sicca syndrome)... she uses Magic Mouthwash Prn + sialogogues...  HYPERTENSION, BENIGN (ICD-401.1) - controlled on TOPROL XL 50mg - 1/2 tabBid, & DILTIAZEM CD  180mg /d...  BP= 124/82 today, takes meds regularly & tol well... denies CP, worsening palipit, syncope, dyspnea, edema, etc...  MITRAL VALVE PROLAPSE (ICD-424.0) - on ASA 81mg /d, & LANOXIN 0.125mg /d and followed by DrLittle for Cardiology... his notes are reviewed> MVP, HBP, hx palpit, neg cath- Metoprolol incr to 25Bid.  ~  cath 1/05 by DrLittle showed clean coronaries & norm LVF w/ +MVP...  GERD (ICD-530.81) - on PREVACID 30mg /d... denies reflux symptoms, swallowing OK x for dryness, etc...  ~  last EGD 9/02 showed 4cmHH, GERD...  DIVERTICULOSIS OF COLON (ICD-562.10),  IRRITABLE BOWEL SYNDROME (ICD-564.1), & Family Hx of COLON CANCER (ICD-153.9) - followed for GI by DrSamLeBauer- now DrGessner- colonoscopy 2/06 showed a tortuous and spastic colon, divertics, otherw neg... her sister died in her 61's w/ colon cancer... f/u colonoscopy 4/10 showed mod divertics, hems, melanosis> Rx'd w/ Miralax/ Senakot-S.  ~  11/11:  she notes incr IBS symptoms- rec Rx w/ BENTYL 20mg Qid Prn, Mylicon, Phazyme, etc...  URINARY INCONTINENCE (ICD-788.30) - eval by GYN= DrRNeal (on Premarin 0.9mg /d)...  LOW BACK PAIN SYNDROME (ICD-724.2) - on VICODIN up to 3/ day... Ortho= DrMortenson, Rendall; and prev Neurosurg= DrJones...  FIBROMYALGIA (ICD-729.1) - she has chr fatigue symptoms and diffuse aches & pains... she has been eval by Rheum DrDeveshwar w/ recent dx of Sjogren's Syndrome & DDD- on CYCLOBENZAPRINE 10mg - 1/2-1 tab Prn...  MEMORY LOSS (ICD-780.93) - eval by DrHickling for Neurology w/ MRI done- pt states "it's not dementia, just hardening of the arteries"... his note from  4/08 indicates- OBS, gait abn & neck/ Back pain diagnoses...  NEUROPATHY (ICD-355.9) - on GABAPENTIN 300mg Bid per DrHickling, Neurology... neuropathy symptoms improved on Rx.  ANXIETY DEPRESSION (ICD-300.4) - she take ALPRAZOLAM 0.5mg Bid as needed... prev Psychiatric Rx from DrCottle but she hasn't seen him in >34yr...  Hx of ADVERSE DRUG  REACTION (ICD-995.20) - hx of mult drug allergies and intolerances including:  PCN, Sulfa, Doxy, NSAIDs, Demerol, Codeine, DCN100, Tramadol, Phenergan, Thorazine, Stelazine...  NOTE: she takes Vicodin for pain, and tolerates this satis...  Health Maintenance - GYN= DrNeal on Premarin, Calcium, Vits, & Fish Oil...   Preventive Screening-Counseling & Management  Alcohol-Tobacco     Smoking Status: quit     Year Quit: 1953     Pack years: 78yrs  Allergies: 1)  ! Penicillin 2)  ! * Stelazine 3)  ! * Phenothazine 4)  ! * Dolonil 5)  ! Septra 6)  ! Tolectin 7)  ! Naprosyn 8)  ! Doxycycline 9)  ! * Tramadol 10)  ! Darvon 11)  ! Codeine 12)  ! Demerol 13)  ! Tylox 14)  ! * Northex 15)  Amitiza (Lubiprostone)  Comments:  Nurse/Medical Assistant: The patient's medications and allergies were reviewed with the patient and were updated in the Medication and Allergy Lists.  Past History:  Past Medical History: SJOGREN'S SYNDROME (ICD-710.2) DRY EYE SYNDROME (ICD-375.15) * CHRONIC ORAL DISCOMFORT HYPERTENSION, BENIGN (ICD-401.1) MITRAL VALVE PROLAPSE (ICD-424.0) GERD (ICD-530.81) DIVERTICULOSIS OF COLON (ICD-562.10) IRRITABLE BOWEL SYNDROME (ICD-564.1) Family Hx of CARCINOMA, COLON, FAMILY HX (ICD-V16.0) URINARY INCONTINENCE (ICD-788.30) LOW BACK PAIN SYNDROME (ICD-724.2) FIBROMYALGIA (ICD-729.1) MEMORY LOSS (ICD-780.93) NEUROPATHY (ICD-355.9) ANXIETY DEPRESSION (ICD-300.4) Hx of ADVERSE DRUG REACTION (ICD-995.20)  Past Surgical History: S/P hysterectomy S/P appendectomy S/P cholecystectomy S/P mandible/ TMJ surgery S/P bilat cataract extractions  Family History: Reviewed history from 01/06/2009 and no changes required. Mother died age 63 due to heart problems Father died age 72 due to complications of pneumonia and MI Sister died age 62 due to cancer Brother alive at age 39 Brother deceased from cancer  Social History: Reviewed history from 01/06/2009 and no  changes required. Widow 2 Children Min smoking hx (in her 58s) No alcohol Not exercising Retired  Review of Systems      See HPI       The patient complains of decreased hearing, hoarseness, dyspnea on exertion, and abdominal pain.  The patient denies anorexia, fever, weight loss, weight gain, vision loss, chest pain, syncope, peripheral edema, prolonged cough, headaches, hemoptysis, melena, hematochezia, severe indigestion/heartburn, hematuria, incontinence, muscle weakness, suspicious skin lesions, transient blindness, difficulty walking, depression, unusual weight change, abnormal bleeding, enlarged lymph nodes, and angioedema.    Vital Signs:  Patient profile:   77 year old female Height:      63 inches Weight:      126.38 pounds BMI:     22.47 O2 Sat:      95 % on Room air Temp:     96.7 degrees F oral Pulse rate:   93 / minute BP sitting:   124 / 82  (left arm) Cuff size:   regular  Vitals Entered By: Randell Loop CMA (January 03, 2010 3:05 PM)  O2 Sat at Rest %:  95 O2 Flow:  Room air CC: 6 month ROV & review of mult medical problems... Is Patient Diabetic? No Pain Assessment Patient in pain? no      Comments no changes in meds today   Physical Exam  Additional Exam:  WD, WN,  77 y/o WF in NAD... she has dry eyes & prominent dry mouth symptoms... GENERAL:  Alert & oriented; pleasant & cooperative...  HEENT:  [place]/AT, EOM-wnl, PERRLA, EACs-clear, TMs-wnl, NOSE-clear, THROAT-clear & excessively dry. NECK:  Supple w/ fairROM; no JVD; normal carotid impulses w/o bruits; no thyromegaly or nodules palpated; no lymphadenopathy. CHEST:  Clear to P & A; without wheezes/ rales/ or rhonchi. HEART:  Regular Rhythm; without murmurs/ rubs/ or gallops heard... ABDOMEN:  Soft & nontender; normal bowel sounds; no organomegaly or masses detected. EXT: without deformities, mild arthritic changes; no varicose veins/ venous insuffic/ or edema. NEURO:  CN's intact;  no focal neuro  deficits... DERM:  No lesions noted; no rash etc...    MISC. Report  Procedure date:  01/03/2010  Findings:      DATA REVIEWED:  ~  Office notes from DrDeveshwar 09/02/09, & 12/03/09...   Impression & Recommendations:  Problem # 1:  SJOGREN'S SYNDROME (ICD-710.2) She has /Sjogren's sicca syndrome affecting eyes> DrDeveshwar, [facility] + DrHecker, and DrTafeen... continue their Rx & Plaquenil recently started...  Problem # 2:  HYPERTENSION, BENIGN (ICD-401.1) Controlled>  same meds. Her updated medication list for this problem includes:    Toprol Xl 50 Mg Tb24 (Metoprolol succinate) .Marland Kitchen... 1/2 tab by mouth two times a day...    Cartia Xt 180 Mg Cp24 (Diltiazem hcl coated beads) .Marland Kitchen... Take 1 tablet by mouth once a day  Problem # 3:  MITRAL VALVE PROLAPSE (ICD-424.0) Followed by DrALittle... Her updated medication list for this problem includes:    Bayer Aspirin Ec Low Dose 81 Mg Tbec (Aspirin) .Marland Kitchen... Take 1 tablet by mouth once a day    Toprol Xl 50 Mg Tb24 (Metoprolol succinate) .Marland Kitchen... 1/2 tab by mouth two times a day...  Problem # 4:  IRRITABLE BOWEL SYNDROME (ICD-564.1) We discussed adding BENTYL 20mg  up to Qid for cramps etc... and try Mylicon/ Phazyme OTC for gas; & Miralax/ Senakot for constip...   Problem # 5:  LOW BACK PAIN SYNDROME (ICD-724.2) Followed by Ok Anis, & DrJones... Vicodin refilled. Her updated medication list for this problem includes:    Bayer Aspirin Ec Low Dose 81 Mg Tbec (Aspirin) .Marland Kitchen... Take 1 tablet by mouth once a day    Vicodin 5-500 Mg Tabs (Hydrocodone-acetaminophen) .Marland Kitchen... 1 tablet every 4-6 hours as needed not to exceed 3 per day    Cyclobenzaprine Hcl 10 Mg Tabs (Cyclobenzaprine hcl) .Marland Kitchen... Take 1/2 - 1 tab by mouth four times per day as needed  Problem # 6:  NEUROPATHY (ICD-355.9) Stable on Neurontin Rx...  Problem # 7:  ANXIETY DEPRESSION (ICD-300.4) Continue Alpraz...  Problem # 8:  OTHER MEDICAL PROBLEMS AS  NOTED>>>  Complete Medication List: 1)  Plaquenil 200 Mg Tabs (Hydroxychloroquine sulfate) .... Take 1 tab by mouth once daily as directed by drdeveshwar... 2)  Pilocarpine Hcl 5 Mg Tabs (Pilocarpine hcl) .... Take one tablet by mouth two times a day as directed by dr. Corliss Skains 3)  Restasis 0.05 % Emul (Cyclosporine) .... As directed 4)  Refresh Tears 0.5 % Soln (Carboxymethylcellulose sodium) .... Use one drop in each eye two times a day 5)  Magic Mouthwash  .... One teaspoon gargle and swallow four times a day as needed 6)  Bayer Aspirin Ec Low Dose 81 Mg Tbec (Aspirin) .... Take 1 tablet by mouth once a day 7)  Toprol Xl 50 Mg Tb24 (Metoprolol succinate) .... 1/2 tab by mouth two times a day... 8)  Cartia Xt 180 Mg  Cp24 (Diltiazem hcl coated beads) .... Take 1 tablet by mouth once a day 9)  Digoxin 0.125 Mg Tabs (Digoxin) .... Take 1 tablet by mouth once a day 10)  Fish Oil 1000 Mg Caps (Omega-3 fatty acids) .... Take 1 tablet by mouth once a day 11)  Prevacid 30 Mg Cpdr (Lansoprazole) .... Take 1 tablet by mouth once a day 12)  Dicyclomine Hcl 20 Mg Tabs (Dicyclomine hcl) .... Take 1 tab by mouth every 6h as needed for abd cramping... 13)  Miralax Powd (Polyethylene glycol 3350) .... Take 1 capful in water two times a day 14)  Senokot S 8.6-50 Mg Tabs (Sennosides-docusate sodium) .... Take 1-2 by mouth at bedtime. 15)  Premarin 0.9 Mg Tabs (Estrogens conjugated) .... Take 1 tablet by mouth once a day 16)  Caltrate 600+d Plus 600-400 Mg-unit Tabs (Calcium carbonate-vit d-min) .... 2 tabs by mouth once daily 17)  Multivitamins Tabs (Multiple vitamin) .... Take 1 tablet by mouth once a day 18)  Vicodin 5-500 Mg Tabs (Hydrocodone-acetaminophen) .Marland Kitchen.. 1 tablet every 4-6 hours as needed not to exceed 3 per day 19)  Gabapentin 300 Mg Caps (Gabapentin) .... Take 1 cap by mouth two times a day... 20)  Cyclobenzaprine Hcl 10 Mg Tabs (Cyclobenzaprine hcl) .... Take 1/2 - 1 tab by mouth four times per  day as needed 21)  Alprazolam 0.5 Mg Tabs (Alprazolam) .Marland Kitchen.. 1 by mouth two times a day as needed  Patient Instructions: 1)  Today we updated your med list- see below.... 2)  Continue your current meds and new Rx from drDeveshwar... 3)  For the GAS: try the "upper gas" Rx= MYLICON or GAS-X, and the "lower gas" Rx= PHAZYME (all avail OTC & may be take 4 times daily).Marland KitchenMarland Kitchen 4)  For the Cramping PAIN: try the DICYCLOMINE 20mg - one tab every 6H as needed... 5)  Let's plan a follow visit in 6months w/ FASTING blood work at that time... Prescriptions: DICYCLOMINE HCL 20 MG TABS (DICYCLOMINE HCL) take 1 tab by mouth every 6H as needed for abd cramping...  #50 x prn   Entered and Authorized by:   [name] MD   Signed by:   [name] MD on 01/03/2010   Method used:   Print then Give to Patient   RxID:   [id number]    Immunization History:  Influenza Immunization History:    Influenza:  historical (11/15/2009)

## 2010-03-15 NOTE — Progress Notes (Signed)
Summary: request refill asap  Phone Note Call from Patient   Caller: Daughter-Starlena slaughter Call For: nadel Summary of Call: daughter is upset that she has to "constantly call to have rx for hydrocodone refilled. it was denied recently and "should not have been". she gives her mom (pt) the EXACT amount that she is to have and nothing more. this has become especially difficult when she has to call on the weekends (going thru answering machines and waiting for call backs, etc). daughter wants nurse to call back asap and please refill rx asap. [facility] [address] daughter kumari [phone] Initial call taken by: Tivis Ringer, CNA,  January 05, 2010 3:23 PM  Follow-up for Phone Call        per TD ok to refill #90 but change directions to 1 tablet every 4-6 hours not to exceed 3 per day.  I called into pharmacy. Pt daughter aware.Carron Curie CMA  January 05, 2010 4:05 PM     New/Updated Medications: VICODIN 5-500 MG  TABS (HYDROCODONE-ACETAMINOPHEN) 1 tablet every 4-6 hours as needed not to Exceed 3 PER DAY Prescriptions: VICODIN 5-500 MG  TABS (HYDROCODONE-ACETAMINOPHEN) 1 tablet every 4-6 hours as needed not to Exceed 3 PER DAY  #90 x 0   Entered by:   Carron Curie CMA   Authorized by:   [name] MD   Signed by:   Carron Curie CMA on 01/05/2010   Method used:   Telephoned to ...       [facility] [address] (retail)       [address]       [place], [place]  [address]       Ph: [phone] or [phone]       Fax: [phone]   RxID:   [id number]

## 2010-03-15 NOTE — Progress Notes (Signed)
Summary: TALK TO NURSE  Phone Note Call from Patient Call back at [phone]   Caller: Daughter Taylynn Call For: NADEL Summary of Call: NEED TO DISCUSS GETTING SOMEONE TO COME INTO HOME TO ASSIST WITH PT CARE Initial call taken by: Rickard Patience,  October 08, 2009 3:54 PM  Follow-up for Phone Call        called and spoke with pts daughter---she stated that the pt is no longer able to take her meds own her own---pt is getting confused with the meds and daily living.  pts daughter is requesting to have someone come in and help with ADLs for pt. pts daughter has recently had back surgery so she is unable to help right now.  they are having to take her the meals and meds three times daily--requesting a home nurse visit for help.  please advise if ok to send order for nurse visit.  thanks Randell Loop CMA  October 08, 2009 4:11 PM   Additional Follow-up for Phone Call Additional follow up Details #1::        order for home assessment has been put in computer for pt. Randell Loop CMA  October 11, 2009 9:48 AM

## 2010-03-16 ENCOUNTER — Telehealth: Payer: Self-pay | Admitting: Pulmonary Disease

## 2010-03-18 NOTE — Letter (Signed)
Summary: External Correspondence  External Correspondence   Imported By: Sherian Rein 12/17/2009 11:35:37  _____________________________________________________________________  External Attachment:    Type:   Image     Comment:   External Document

## 2010-03-23 NOTE — Progress Notes (Signed)
Summary: Alprazolam refills  Phone Note Refill Request Message from:  Fax from Pharmacy on March 16, 2010 2:00 PM  Refills Requested: Medication #1:  ALPRAZOLAM 0.5 MG  TABS 1 by mouth two times a day as needed.   Dosage confirmed as above?Dosage Confirmed   Brand Name Necessary? No   Supply Requested: 1 month   Last Refilled: 02/07/2010   Notes: Dr. Kriste Basque [facility] [address] ([address] [phone]   Method Requested: Telephone to Pharmacy Next Appointment Scheduled: 07/08/10 w/ Dr. Kriste Basque Initial call taken by: Michel Bickers CMA,  March 16, 2010 2:01 PM  Follow-up for Phone Call        Pls advise if okay for refills.Michel Bickers CMA  March 16, 2010 2:02 PM    Prescriptions: ALPRAZOLAM 0.5 MG  TABS (ALPRAZOLAM) 1 by mouth two times a day as needed  #60 x 6   Entered by:   Randell Loop CMA   Authorized by:   [name] MD   Signed by:   Randell Loop CMA on 03/16/2010   Method used:   Historical   RxID:   [id number]  [facility] [address] [facility]ills of alprazolam---pt has appt with SN in may---pt will need to keep this appt. Randell Loop CMA  March 16, 2010 2:16 PM

## 2010-05-24 ENCOUNTER — Other Ambulatory Visit: Payer: Self-pay | Admitting: Pulmonary Disease

## 2010-05-26 ENCOUNTER — Telehealth: Payer: Self-pay | Admitting: Pulmonary Disease

## 2010-05-26 MED ORDER — LANSOPRAZOLE 30 MG PO CPDR: DELAYED_RELEASE_CAPSULE | ORAL | Status: DC

## 2010-05-26 NOTE — Telephone Encounter (Signed)
Spoke w/ pt daughter and she is awre rx was sent to pharmacy

## 2010-06-23 ENCOUNTER — Telehealth: Payer: Self-pay | Admitting: Pulmonary Disease

## 2010-06-23 NOTE — Telephone Encounter (Signed)
Pt's daughter says the pt c/o increased back and leg pain. She also has increased anxiety and mood swings. She seems to be very angry. She currently has vicodin 5/500 every 4-6 hours prn, gabapentin 300 mg at bedtime and xanax 0.5 mg twice daily. Daughter says she does not take her medications the way she should but tells everyone she is taking her medications. She is sch for f/u with SN on 07/08/2010. Daughter aware she may not get a callback until Fri., 06/24/2010 and is fine with this. Pls advise.

## 2010-06-24 NOTE — Telephone Encounter (Signed)
Per SN---we will discuss with pt during her ov on 5-25.  What does the daughter suggest?   Options are 1.  Back and leg pain--refer her to ortho    2.  Anxiety and anger--would she agree to see psychiatrist? For eval?

## 2010-06-24 NOTE — Telephone Encounter (Signed)
Spoke with pt's daughter and discussed recs per SN. She states that as far as the psych eval, she wants SN to discuss this with pt b/c she thinks that she would'nt go for it if she approaches her about it. She states that pt already has seen ortho before, and she will call them to see if she still even needs referral.  If she does need to be referred, she can discuss this at pending ov.

## 2010-07-01 ENCOUNTER — Encounter: Payer: Self-pay | Admitting: *Deleted

## 2010-07-01 ENCOUNTER — Encounter: Payer: Self-pay | Admitting: Pulmonary Disease

## 2010-07-01 NOTE — Cardiovascular Report (Signed)
NAME:  Ariana [name], Ariana [name]                          ACCOUNT NO.:  [id number]   MEDICAL RECORD NO.:  [id number]                   PATIENT TYPE:  OIB   LOCATION:  2899                                 FACILITY:  [facility]   PHYSICIAN:  Thereasa Solo. Little, M.D.              DATE OF BIRTH:  [birth year]   DATE OF PROCEDURE:  02/17/2003  DATE OF DISCHARGE:                              CARDIAC CATHETERIZATION   INDICATIONS FOR TEST:  [name] is a 78 year old female who has mitral  valve prolapse.  Her husband died at Christmas time and she started having  recurrent episodes of chest pain over the last four days.  She presented to  the office yesterday complaining of episodes of 30 minutes of chest pressure  and heaviness associated with increasing palpitations and shortness of  breath.  She was brought in for outpatient cardiac catheterization this  morning.   PROCEDURE:  The patient was prepped and draped in the usual sterile fashion  exposing the right groin.  Following local anesthetic with 1% Xylocaine the  Seldinger technique was employed and a 5-French introducer sheath was placed  into the right femoral artery.  Left and right coronary arteriography and  ventriculography in the RAO projection was performed.   COMPLICATIONS:  None.   EQUIPMENT:  5-French Judkins configuration catheters.   RESULTS:  HEMODYNAMIC MONITORING:  Central aortic pressure was 124/57.  Left ventricular pressure was 122/1.   VENTRICULOGRAPHY:  Ventriculography in the RAO projection using 25 mL of contrast at 12  mL/second showed normal LV systolic function.  There was marked ventricular  ectopy during the injection.  Clear mitral valve prolapse was seen but I  could not tell if mitral regurgitation was related to the prolapse or to the  ectopy.  Left ventricular end-diastolic pressure was 13.  The ejection  fraction was greater than 60%.   CORONARY ARTERIOGRAPHY:  No calcification was seen on fluoroscopy.  1.  Left main normal.  2. LAD:  The LAD extended down and crossed the apex of the heart.  It gave     rise to one moderate sized first diagonal.  This entire system was free     of disease.  3. Circumflex:  The circumflex gave rise to a very large first OM that     actually trifurcated.  The second OM was smaller than the first and the     ongoing circumflex was relatively small.  This entire system was free of     disease.  4. Right coronary artery:  The right coronary artery gave rise to a large     PDA with no real posterolateral branches.  This system was free of     disease but the proximal portion of the RCA had minimal irregularities.   CONCLUSION:  1. No significant coronary disease.  2. Normal left ventricular systolic function.  3. Mitral valve prolapse.   From  a cardiac standpoint I cannot explain her chest pain based on coronary  artery disease; however, it could be related to her mitral valve prolapse  and exacerbated by the stress of her husband's death.  She should be ready  for discharge to home later today with follow-up in my office tomorrow.                                               Thereasa Solo. Little, M.D.    ABL/MEDQ  D:  02/17/2003  T:  02/17/2003  Job:  [id number]   cc:   Lonzo Cloud. Kriste Basque, M.D. [facility]   Cath Lab

## 2010-07-01 NOTE — Discharge Summary (Signed)
[facility]  Patient:    Ariana [name], Ariana [name]                         MRN: [id number] Adm. Date:  05/27/99 Disc. Date: 06/08/99 Attending:  Thereasa Solo. Little, M.D. Dictator:   Kingsley Plan, P.A. CC:         Lonzo Cloud. Kriste Basque, M.D. [facility]   Discharge Summary  DATE OF BIRTH:  Jun 07, 1929  ADMITTING PHYSICIAN:  Thereasa Solo. Little, M.D.  DISCHARGE PHYSICIAN:  Madaline Savage, M.D.  DISCHARGE DIAGNOSES:  1. Delirium.  2. Major depressive disorder.  3. Anxiety.  4. Bradycardia with junctional escape rhythm with good response to atropine,     resolved.  5. Hyperkalemia, resolved.  6. Hypokalemia, resolved.  7. Digoxin toxicity, resolved.  8. Hyponatremia, resolved.  9. Mild renal insufficiency, resolved. 10. Elevated CK with relative index and troponin normal, unclear etiology. 11. Uncontrolled hypertension, resolved. 12. Gastroesophageal reflux disease. 13. Chronic pain syndrome/fibromyalgia. 14. Status post cholecystectomy. 15. Essential tremor. 16. Antidepressive medication withdrawal. 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 18. Urinary tract infection treated with antibiotics x 1 week.  PROCEDURES:  1. Head CT performed on May 30, 1999:  Essentially negative.  2. EEG:  Showed mild to moderate slowing and definite photomyoclonic     response. Technician witnessed seizure activity with photic stimulation     and a bladder incontinence. This was performed on May 31, 1999.  3. A 2-D echocardiogram performed on May 30, 1999:  Showed no AI. No MR.     Normal LV with mild LVH.  4. Lumbar puncture performed on June 01, 1999.  5. MRI performed on May 30, 1999:  Showed mild diffuse meningeal     enhancement. Atrophy with small-vessel disease. Diffusion negative.     Chronic sinusitis.  COMPLICATIONS:  None.  DISPOSITION:  Home with home health R.N., aides, and social worker.  CONDITION ON DISCHARGE:  Improved. The patient is alert and oriented x 3 in no acute  distress. No chest pain, shortness of breath, or arrhythmias. Ambulating without difficulty.  DISCHARGE MEDICATIONS:  1. Cardizem CD 180 mg one p.o. q.d.  2. Premarin 0.9 mg one p.o. q.d.  3. Enteric-coated aspirin 81 mg one p.o. q.d.  4. Prilosec 20 mg one p.o. q.d.  5. Amitriptyline 25 mg two at bedtime.  6. Xanax 0.5 mg p.r.n.  7. Stop Lopressor.  DISCHARGE INSTRUCTIONS:  None given on discharge sheet.  HISTORY OF PRESENT ILLNESS:  The patient is a 77 year old white female admitted on May 27, 1999, with complaints of not feeling well for some time. Feeling weak and lightheaded with some nausea but states that she felt worse since around 9 a.m. today describing weakness, lightheadedness, inability to ambulate on her own, and chest tightness. She states that she has had nausea but no vomiting. The patient states that she has had a recent stress test within the last 6 months which apparently was normal.  Per Dr. Darrol Poke note, the patient with a history of mitral valve prolapse with occasional palpitations, tachycardia, hypertension, and atypical chest pain. Nuclear study July of 2000 with 70% EF and no ischemia. Over the past 10 to 14 days, more fatigued and tiredness than in the past. No syncope. Orthostatic symptoms plus or minus palpitations. Chronic fatigue and tiredness greater than a year. Currently on iron treatment with black BMs but no obvious bleeding. The patient was admitted with bradycardia with junctional escape rhythm with nonresponse to  atropine, hyperkalemia, digoxin toxicity, hyponatremia, mild renal insufficiency, and elevated CKs. All questionable etiology. Clearly no evidence of myocardial infarction, possible myopathy, or myositis.  PAST MEDICAL HISTORY:  Palpitations, diverticular disease, kidney stones, neck and lumbar spine problems, hysterectomy, appendectomy, lower mandibular reconstruction, facial fibroma.  The patient was admitted and started on IV  calcium, Kayexalate, IV fluids. All outpatient medications on hold. External pacemaker by bedside and an amp of D-50 and 5 units of Regular IV insulin. Currently unclear metabolic process. No ACE and statin therapy. Digoxin level up but should not cause this degree of rhythm disturbances even with calcium and beta blockers. After one amp of calcium given, heart rate from 52 to 77, normal sinus rhythm. Blood pressure up systolically from 90 to 125. The patient admitted and seriousness of situation discussed with family.  ALLERGIES:  PENICILLIN, STELAZINE, PHENOTHIAZINE, SEPTRA, DILAUDID, TOLECTIN, NAPROSYN, DARVON, DEMEROL, CODEINE, TYLOX, NORFLEX.  LABORATORY DATA:  Hematology on June 05, 1999:  White blood count 7.2, hemoglobin 11.5, hematocrit 33.1, platelets 322. Sed rate on June 01, 1999, 45; with a second one done on the 24th was 48. Coag studies on admission:  PT 14.5, INR 1.3, PTT 30. Routine chemistries on June 07, 1999:  Sodium 136, potassium 3.5, chloride 104, CO2 29, glucose 146, BUN 15, creatinine 0.9, calcium 8.8. Cardiac markers on admission:  Total CK 1247, CK-MB 8.3, relative index 0.7, troponin I less than 0.03. Subsequent CK markers performed on the 14th, 21st, and 22nd respectively. Total CK 638, CK-MB 3.6, relative index 0.6, troponin I less than 0.03; 40.9, 2.3, less than 0.03, 42, 0.6, and 1.4. TSH performed on May 28, 1999, was 1.926. Anemia studies performed on June 07, 1998, showed B12 of 480, folate ______  20, red blood cell folate 671, and homosalate folate not performed. Digoxin level on admission 2.8, with repeat value on April 14 of 1. CSF cell count showed white blood count of 15, lymphocytes 97%, and monocytes macrophage 3, with protein 49, and glucose 70. Miscellaneous body fluid testing:  VDRL was nonreactive. Urinalysis performed on June 01, 1999, with specific gravity 1.016, pH 7.5, hemoglobin trace, protein 30, urobilinogen 1, nitrite positive,  leukocyte esterase large,  epithelials few, red blood cells 0 to 5, and bacteria many. The patient was treated with antibiotics. CSF on June 01, 1999, negative. RPR on June 07, 1999, nonreactive.  EKG on April 13:  Incomplete right bundle branch block, ST and T wave abnormalities, prolonged QT, marked bradycardia with junctional escape beat, questionable complete heart block with junctional rhythm, long rhythm strips suggested. Since previous tracing February 12, 1998, rhythm change and anterior ischemia seen. June 04, 1999, normal sinus rhythm, left anterior fascicular block, T wave abnormalities. Since last tracing May 27, 1999, P waves back, sinus rhythm, ST-T appearance improved.  Chest x-ray on May 27, 1999, showed no acute abnormalities.  HOSPITAL COURSE:  Ms. Davern is a 76 year old female admitted on May 27, 1999, with some metabolic abnormalities and bradycardia with junctional escape rhythm responding to atropine and calcium. Seriousness of situation discussed with family. The patient was given IV calcium, Kayexalate, IV fluids. All outpatient medications held. External pacemaker put at bedside, and patient given an amp of D-50 with 5 units of Regular IV insulin. The following morning, the patient had no specific complaints, except for lower back pain and chest pain with coughing. CK-MBs came back elevated with relative index and troponins all negative. Digoxin level was now within normal limits and potassium was  within normal limits. BUN and creatinine were now within normal range. On telemetry, the patient has sinus rhythm with no more bradycardia or junctional rhythm. The patient was awake, oriented, and had appropriate behavior. TSH was still pending at that time. At that time, we will increase activity, discontinue IV fluids, and consider patient home within the next several days. Over the next 24 hours, the patient continued to do well, except for potassium now  low at 3.1 from 3.7. Potassium was repleted. The patient was expected to go home the next 1 or 2 days.  On May 30, 1999, nursing staff noticing a slight confusion with inappropriate behaviors and speech. Cardiology to see the patient that morning. Also noticed a new distant murmur with a 2-D echo to be checked. Mental status changes noted. Questionable CVA versus medication withdrawal. Will hold all antidepressants at this time and check a head CT and get a neurological evaluation. The patients blood pressure was also noted to be high and was restarted on low-dose Cardizem. A 2-D echo was performed later that day and nothing significant to be found. Neurology also consulted that day and ordered an MRI and EEG of the brain.  The following day, MRI came back with no evidence of stroke or other acute abnormalities. Felt confusion was unclear etiology and that an EEG was scheduled. The patient continued to do well cardiac wise and the [facility] was asked to help with noncardiac problems. EEG done on May 31, 1999, showed mild to moderate slowing and definite photomyoclonic response with a technician witnessing seizure activity and bladder incontinence. Still patient with cardiac to do well for the following of inpatient status. It is clear that patient is now suffering a neurological versus psychiatric problem. An LP was performed on June 01, 1999, with no significant findings. The patient continued to be slightly confused and inappropriate with behavior and speech intermittently throughout the remainder of hospital stay. Neurology continued to see until psychiatry was consulted on June 08, 1999, with diagnosis of delirium, major depressive disorder, and anxiety. The patient was taken off of all antidepressant medications and started on Remeron and Risperdal.  The patient continued to do well cardiac wise with no further arrhythmias. Prolonged hospital stay secondary  to what was believed to be antidepressant withdrawal and metabolic abnormality secondary to this. The patient continued to do well with all test results coming back within normal limits. The patient was finally discharged on June 08, 1999, with follow-up with both psychiatry, Jacqulynn Cadet. Jennelle Human, M.D. and Thereasa Solo. Little, M.D. within the next several weeks. The patient was encouraged to follow only the medications prescribed to her and written out on her discharge summary. If any questions, the patient was to contact as soon as possible. Psychiatry felt comfortable discharging patient to home rather than to an assisted living place. Initial plans were to send patient home with home health R.N., nursing aide, and a Child psychotherapist. The patient would then go to an assisted living placement from there if needed. DD:  12/02/99 TD:  12/02/99 Job: [id number]

## 2010-07-01 NOTE — Assessment & Plan Note (Signed)
[facility]                           GASTROENTEROLOGY OFFICE NOTE   NAME:Bowley, CHERELL COLVIN                       MRN:          [id number]  DATE:11/29/2005                            DOB:          1929-05-27    Suezette comes in and says she needs refills of Prevacid and Reglan, had some  alternating diarrhea and constipation but otherwise does alright as long as  she takes her medicine. Aunna has a known 4 cm hiatal hernia and has been  taking the Prevacid 30 mg with good results. She also takes the Reglan at  bedtime. She does have some diverticular disease and has some chronic  anxiety and depression problems.   She has had some problems with her oropharyngeal area with the tongue and  jaw having a great deal of burning and had to go to multiple doctors trying  to get some relief from this. There was a fungus cultured from this and she  took Diflucan and Nystatin with only fair success.   PHYSICAL EXAMINATION:  She weighed 128. Blood pressure 124/70, pulse 67 and  regular. Neck, heart and all unremarkable.  Rectal was deferred.   IMPRESSION:  1. Irritable bowel with alternating diarrhea and constipation, possibly      all related to a tortuous colon with some mild diverticular disease.  2. Gastroesophageal reflux disease, 4 cm hiatal hernia.  3. Anxiety and depression.  4. History of mitral valve prolapse.  5. History of small colon polyps.   RECOMMENDATIONS:  Continue on her same medications. I gave her prescriptions  for this and refills.            ______________________________  Ulyess Mort, MD      SML/MedQ  DD:  11/29/2005  DT:  12/01/2005  Job #:  [id number]

## 2010-07-08 ENCOUNTER — Encounter: Payer: Self-pay | Admitting: Pulmonary Disease

## 2010-07-08 ENCOUNTER — Ambulatory Visit (INDEPENDENT_AMBULATORY_CARE_PROVIDER_SITE_OTHER): Payer: Medicare Other | Admitting: Pulmonary Disease

## 2010-07-08 ENCOUNTER — Other Ambulatory Visit (INDEPENDENT_AMBULATORY_CARE_PROVIDER_SITE_OTHER): Payer: Medicare Other

## 2010-07-08 ENCOUNTER — Other Ambulatory Visit (INDEPENDENT_AMBULATORY_CARE_PROVIDER_SITE_OTHER): Payer: Medicare Other | Admitting: Pulmonary Disease

## 2010-07-08 DIAGNOSIS — I10 Essential (primary) hypertension: Secondary | ICD-10-CM

## 2010-07-08 DIAGNOSIS — K589 Irritable bowel syndrome without diarrhea: Secondary | ICD-10-CM

## 2010-07-08 DIAGNOSIS — F341 Dysthymic disorder: Secondary | ICD-10-CM

## 2010-07-08 DIAGNOSIS — M3500 Sicca syndrome, unspecified: Secondary | ICD-10-CM

## 2010-07-08 DIAGNOSIS — M545 Low back pain, unspecified: Secondary | ICD-10-CM

## 2010-07-08 DIAGNOSIS — K219 Gastro-esophageal reflux disease without esophagitis: Secondary | ICD-10-CM

## 2010-07-08 DIAGNOSIS — I059 Rheumatic mitral valve disease, unspecified: Secondary | ICD-10-CM

## 2010-07-08 DIAGNOSIS — G589 Mononeuropathy, unspecified: Secondary | ICD-10-CM

## 2010-07-08 DIAGNOSIS — E7800 Pure hypercholesterolemia, unspecified: Secondary | ICD-10-CM

## 2010-07-08 DIAGNOSIS — R413 Other amnesia: Secondary | ICD-10-CM

## 2010-07-08 DIAGNOSIS — K5730 Diverticulosis of large intestine without perforation or abscess without bleeding: Secondary | ICD-10-CM

## 2010-07-08 DIAGNOSIS — Z13220 Encounter for screening for lipoid disorders: Secondary | ICD-10-CM

## 2010-07-08 LAB — CBC WITH DIFFERENTIAL/PLATELET
Basophils Absolute: 0 10*3/uL (ref 0.0–0.1)
Basophils Relative: 0.3 % (ref 0.0–3.0)
Eosinophils Absolute: 0.1 10*3/uL (ref 0.0–0.7)
Lymphocytes Relative: 23.5 % (ref 12.0–46.0)
MCHC: 34.2 g/dL (ref 30.0–36.0)
MCV: 94 fl (ref 78.0–100.0)
Monocytes Absolute: 0.5 10*3/uL (ref 0.1–1.0)
Neutrophils Relative %: 68.8 % (ref 43.0–77.0)
RBC: 3.89 Mil/uL (ref 3.87–5.11)
RDW: 12.4 % (ref 11.5–14.6)

## 2010-07-08 LAB — BASIC METABOLIC PANEL
BUN: 11 mg/dL (ref 6–23)
CO2: 31 mEq/L (ref 19–32)
Calcium: 9.6 mg/dL (ref 8.4–10.5)
Creatinine, Ser: 0.6 mg/dL (ref 0.4–1.2)
Glucose, Bld: 104 mg/dL — ABNORMAL HIGH (ref 70–99)
Potassium: 4.5 mEq/L (ref 3.5–5.1)
Sodium: 131 mEq/L — ABNORMAL LOW (ref 135–145)

## 2010-07-08 LAB — HEPATIC FUNCTION PANEL
ALT: 18 U/L (ref 0–35)
Total Protein: 7.6 g/dL (ref 6.0–8.3)

## 2010-07-08 LAB — LIPID PANEL
HDL: 62.2 mg/dL (ref >39.00)
Total CHOL/HDL Ratio: 2

## 2010-07-08 LAB — SEDIMENTATION RATE: Sed Rate: 11 mm/hr (ref 0–22)

## 2010-07-08 MED ORDER — MAGIC MOUTHWASH: ORAL | Status: DC

## 2010-07-08 NOTE — Patient Instructions (Signed)
Today we updated your med list in EPIC...    Continue your current meds the same...  Today we did your follow up fasting blood work...    Please call the [facility]REE in a few days for your results...    Dial [id number] & when prompted enter your patient number followed by the # symbol...    Your patient number is:  [id number]#  We will arrange for an Orthopedic consultation for further evaluation of your back, hip, & leg pain...  Call for any questions...  Let's plan another routine follow up in 6 months, sooner if needed for problems.Marland KitchenMarland Kitchen

## 2010-07-08 NOTE — Progress Notes (Signed)
Subjective:    Patient ID: Ariana [name], female    DOB: 06-30-1929, 77 y.o.   MRN: [id number]  HPI 77 y/o WF here for a follow up visit... he has multiple medical problems as noted below...   ~  Jul 06, 2009:  she recently saw DrDeveshwar for Rheum w/ extensive lab work done & Dx w/ Sjogren's syndrome/ autoimmune disease... her note is pending but pt indicates new Rx w/ Pilocarpine 5mg Bid, and considering Plaquenil rx...  she saw DrHecker 2/11 & DrLittle 1/11 - see below... she is c/o incr neuropathy symptoms in her legs and wonders what can be done for this> she is on Neurontin from DrHickling at 100mg Tid & we discussed incr to 300mg Bid...  ~  January 03, 2010:  she states she was dx w/ "autoimmune disease" by Rheum DrDeveshwar & started on Plaquenil 200mg /d now- notes from 7/11 & 10/11 reviewed... c/o abd bloating/ gas treated by GI DrGessner w/ Prevacid, Miralax, Senakot-S, OTC Simethacone (Mylicon/ Phazyme)- we discussed trial Bentyl for cramping... BP remains controlled;  denies CP, palpit, ch in SOB, etc;  notes persistant back discomfort. aches/ pains & wants Vicodin refilled;  she remains on Neurontin for her ?neuropathy, and Alpraz for nerves...  ~  Jul 08, 2010:  85mo ROV & she is here w/ her daughter> "it's been rough" c/o incr pain in right side of low back, right hip, right leg- prev eval DrMortenson & we discussed need for Ortho re-evaluation; she is seen regularly (every 6+mo) by Rober Minion for Rheum- Sjogren's syn, ?FM, LBP, ?Neuropathy> on Plaquenil, Vicodin, Flexeril, Neurontin, etc...  From the medical standpoint> BP controlled; GI stable on meds; under a lot of stress & memory loss symptoms about the same; hx mult drug side effects... Labs today look OK x elev TG=202 & advised better low fat diet...          Problem List:  SJOGREN'S SYNDROME (ICD-710.2), & DRY EYE SYNDROME (ICD-375.15) - eval by DrHecker & DrDeveshwar 5/11 w/ extensive lab work done & Dx of Sjogren's syndrome &  started on Pilocarpine 5mg Bid... DrHecker has her on a number of eye drops, & prev thought to be dry eyes w/ ocular rosacea & tubes placed in tear ducts... also seen by Mikey Kirschner...  ~  10/11:  DrDeveshwar started PLAQUENIL 200mg /d... Seen every 85mo by Rheum.  CHRONIC ORAL DISCOMFORT - this has been looked into by mult physicians and no other etiology discovered (other than her sicca syndrome)... she uses Magic Mouthwash Prn + sialogogues...  HYPERTENSION, BENIGN (ICD-401.1) - controlled on TOPROL XL 50mg - 1/2 tabBid, & DILTIAZEM CD 180mg /d...  BP= 134/80 today, takes meds regularly & tol well... denies CP, worsening palipit, syncope, dyspnea, edema, etc...  MITRAL VALVE PROLAPSE (ICD-424.0) - on ASA 81mg /d, & LANOXIN 0.125mg /d and followed by DrLittle for Cardiology... his notes are reviewed> MVP, HBP, hx palpit, neg cath- Metoprolol incr to 25Bid. ~  cath 1/05 by DrLittle showed clean coronaries & norm LVF w/ +MVP...  HYPERLIPIDEMIA>  On low chol, low fat diet alone... ~  2008:  FLP showed TChol 170, TG 176, HDL 50,  ~  5/12:  FLP showed TChol 142, TG 202, HDL 62, LDL 166  GERD (ICD-530.81) - on PREVACID 30mg /d... denies reflux symptoms, swallowing OK x for dryness, etc... ~  last EGD 9/02 showed 4cmHH, GERD...  DIVERTICULOSIS OF COLON (ICD-562.10),  IRRITABLE BOWEL SYNDROME (ICD-564.1), & Family Hx of COLON CANCER (ICD-153.9) - followed for GI by DrSamLeBauer- now DrGessner- colonoscopy 2/06 showed  a tortuous and spastic colon, divertics, otherw neg... her sister died in her 34's w/ colon cancer... f/u colonoscopy 4/10 showed mod divertics, hems, melanosis> Rx'd w/ Miralax/ Senakot-S. ~  11/11:  she notes incr IBS symptoms- rec Rx w/ BENTYL 20mg Qid Prn, Mylicon, Phazyme ==> improved.  URINARY INCONTINENCE (ICD-788.30) - eval by GYN= DrRNeal (on Premarin 0.9mg /d)...  LOW BACK PAIN SYNDROME (ICD-724.2) - on VICODIN up to 3/ day... Ortho= DrMortenson, Rendall, et al... ~  5/12:  She is c/o  incr pain in right side of back/ hip/ leg> refer to Ortho for further eval; Rx Vicodin prn...  FIBROMYALGIA (ICD-729.1) - she has chr fatigue symptoms and diffuse aches & pains... she has been eval by Rheum DrDeveshwar w/ recent dx of Sjogren's Syndrome & DDD- on CYCLOBENZAPRINE 10mg - 1/2-1 tab Prn...  MEMORY LOSS (ICD-780.93) - eval by DrHickling for Neurology w/ MRI done- pt states "it's not dementia, just hardening of the arteries"... his note from 4/08 indicates- OBS, gait abn & neck/ Back pain diagnoses...  NEUROPATHY (ICD-355.9) - on GABAPENTIN 300mg Bid per DrHickling, Neurology... neuropathy symptoms improved on Rx.  ANXIETY DEPRESSION (ICD-300.4) - she take ALPRAZOLAM 0.5mg Bid as needed... prev Psychiatric Rx from DrCottle but she hasn't seen him in >21yr...  Hx of ADVERSE DRUG REACTION (ICD-995.20) - hx of mult drug allergies and intolerances including:  PCN, Sulfa, Doxy, NSAIDs, Demerol, Codeine, DCN100, Tramadol, Phenergan, Thorazine, Stelazine...  NOTE: she takes Vicodin for pain, and tolerates this satis...  Health Maintenance - GYN= DrNeal on Premarin, Calcium, Vits, & Fish Oil...   Past Surgical History  Procedure Date  . Vesicovaginal fistula closure w/ tah   . Appendectomy   . Cholecystectomy   . Mandible surgery   . Temporomandibular joint surgery   . Cataract extraction, bilateral     Outpatient Encounter Prescriptions as of 07/08/2010  Medication Sig Dispense Refill  . ALPRAZolam (XANAX) 0.5 MG tablet Take 0.5 mg by mouth 2 (two) times daily as needed.        . Alum & Mag Hydroxide-Simeth (MAGIC MOUTHWASH) SOLN Take by mouth. One teaspoon gargle and swallow four times a day as needed       . aspirin 81 MG tablet Take 81 mg by mouth daily.        . Calcium Carbonate-Vitamin D (CALTRATE 600+D) 600-400 MG-UNIT per tablet Take 2 tablets by mouth daily.        . carboxymethylcellulose (REFRESH TEARS) 0.5 % SOLN 1 drop 2 (two) times daily.        . cyclobenzaprine  (FLEXERIL) 10 MG tablet Take 10 mg by mouth daily.        . cycloSPORINE (RESTASIS) 0.05 % ophthalmic emulsion 1 drop as directed.        . dicyclomine (BENTYL) 20 MG tablet Take 20 mg by mouth every 6 (six) hours. For abdominal cramping       . digoxin (LANOXIN) 0.125 MG tablet Take 125 mcg by mouth daily.        Marland Kitchen diltiazem (CARDIZEM CD) 180 MG 24 hr capsule Take 180 mg by mouth daily.        Marland Kitchen estrogens, conjugated, (PREMARIN) 0.9 MG tablet Take 0.9 mg by mouth daily. Take daily for 21 days then do not take for 7 days.       Marland Kitchen gabapentin (NEURONTIN) 300 MG capsule Take 300 mg by mouth at bedtime.       Marland Kitchen HYDROcodone-acetaminophen (VICODIN) 5-500 MG per tablet Take 1 tablet by mouth every  6 (six) hours as needed.        . hydroxychloroquine (PLAQUENIL) 200 MG tablet Take 200 mg by mouth daily. As directed by Dr. Corliss Skains       . lansoprazole (PREVACID) 30 MG capsule One tablet once a day  30 capsule  3  . metoprolol (TOPROL-XL) 50 MG 24 hr tablet 50 mg. Take 1/2 tablet by  Mouth two times a day       . Multiple Vitamin (MULTIVITAMIN) tablet Take 1 tablet by mouth daily.        . Omega-3 Fatty Acids (FISH OIL) 1000 MG CAPS Take 1 capsule by mouth daily.        . pilocarpine (SALAGEN) 5 MG tablet Take 5 mg by mouth 2 (two) times daily. As directed by Dr. Corliss Skains       . polyethylene glycol (MIRALAX / GLYCOLAX) packet Take 17 g by mouth daily.        . sennosides-docusate sodium (SENOKOT-S) 8.6-50 MG tablet 1-2 tabs by mouth at bedtime         Allergies  Allergen Reactions  . Codeine     REACTION: nausea unless given with phenergan  . Doxycycline   . Lubiprostone     REACTION: red rash  . Meperidine Hcl     REACTION: nausea unless given with phenergan  . Naproxen   . Oxycodone-Acetaminophen     REACTION: nausea unless given with phenergan  . Penicillins   . Propoxyphene Hcl     REACTION: nausea unless given with phenergan  . Sulfamethoxazole W/Trimethoprim   . Tramadol      Review of Systems        See HPI - all other systems neg except as noted... The patient complains of decreased hearing, hoarseness, dyspnea on exertion, and abdominal pain.  The patient denies anorexia, fever, weight loss, weight gain, vision loss, chest pain, syncope, peripheral edema, prolonged cough, headaches, hemoptysis, melena, hematochezia, severe indigestion/heartburn, hematuria, incontinence, muscle weakness, suspicious skin lesions, transient blindness, difficulty walking, depression, unusual weight change, abnormal bleeding, enlarged lymph nodes, and angioedema.     Objective:   Physical Exam      WD, WN, 77 y/o WF in NAD... she has dry eyes & prominent dry mouth symptoms... GENERAL:  Alert & oriented; pleasant & cooperative...  HEENT:  [place]/AT, EOM-wnl, PERRLA, EACs-clear, TMs-wnl, NOSE-clear, THROAT-clear & excessively dry. NECK:  Supple w/ fairROM; no JVD; normal carotid impulses w/o bruits; no thyromegaly or nodules palpated; no lymphadenopathy. CHEST:  Clear to P & A; without wheezes/ rales/ or rhonchi. HEART:  Regular Rhythm; without murmurs/ rubs/ or gallops heard... ABDOMEN:  Soft & nontender; normal bowel sounds; no organomegaly or masses detected. EXT: without deformities, mild arthritic changes; no varicose veins/ venous insuffic/ or edema. NEURO:  CN's intact;  no focal neuro deficits... DERM:  No lesions noted; no rash etc...   Assessment & Plan:   Right sided back/ hip/ leg pain>  Mult rheumatologic diagnoses, refer to DrRendall & DrDeveshwar for further studies and pain management...  SJOGREN's>  Still w/ very dry Sicca syndrome & medication from DrDeveshwar, Ophthalmology, etc...  HBP>  Controlled on meds, continue same...  MVP>  Followed by DrLittle for Cards, & stable on meds above...  HYPERLIPID>  TG still sl elev & rec to get on better low fat diet...  GI>  GERD, HH, Divertics, IBS, +FemHx colon ca>  Stable on Prev30, last colon 2010- OK, Bentyl  helps...  Chronic Pain Syndrome>  She  states adeq control w/ Vicodin Rx...  Memory loss/ anxiety/ depression>  Prev eval by Neuro- DrHickling, DrCottle for psyche;  Only on alpraz at present.Marland KitchenMarland Kitchen

## 2010-07-12 ENCOUNTER — Other Ambulatory Visit: Payer: Self-pay | Admitting: *Deleted

## 2010-07-12 MED ORDER — DIPHENHYD-HYDROCORT-NYSTATIN MT SUSP: OROMUCOSAL | Status: DC

## 2010-07-12 NOTE — Telephone Encounter (Signed)
Refill sent in for magic mouthwash

## 2010-07-15 ENCOUNTER — Telehealth: Payer: Self-pay | Admitting: Pulmonary Disease

## 2010-07-15 ENCOUNTER — Encounter: Payer: Self-pay | Admitting: *Deleted

## 2010-07-15 MED ORDER — FUROSEMIDE 20 MG PO TABS: 20.0000 mg | ORAL_TABLET | Freq: Every day | ORAL | Status: DC

## 2010-07-15 NOTE — Telephone Encounter (Signed)
Spoke with pt's daughter. She states that pt has had swelling in her legs and ankles x 2 days. She states not having any other complaints. She was seen by her Dr Priscille Kluver today and he did "not do anything about it". No taking any fluid pill currently. Daughter asking for recs. Pls advise thanks!

## 2010-07-15 NOTE — Telephone Encounter (Signed)
Per SN---no salt, elevate legs and try support hose.  rx for lasix 20mg   #30  1 po every morning as needed for swelling.  If she is still retaining fluid she should follow up with Dr. Clarene Duke.  Called and spoke with pts daughter and she is aware of recs.  She stated that she will hold off on the therapy for 1 more week to see how well she is doing then.

## 2010-08-11 ENCOUNTER — Other Ambulatory Visit: Payer: Self-pay | Admitting: *Deleted

## 2010-08-11 MED ORDER — CYCLOBENZAPRINE HCL 10 MG PO TABS: ORAL_TABLET | ORAL | Status: DC

## 2010-09-06 ENCOUNTER — Other Ambulatory Visit: Payer: Self-pay | Admitting: Pulmonary Disease

## 2010-09-20 ENCOUNTER — Other Ambulatory Visit: Payer: Self-pay | Admitting: Pulmonary Disease

## 2010-09-22 ENCOUNTER — Other Ambulatory Visit: Payer: Self-pay | Admitting: Pulmonary Disease

## 2010-09-28 ENCOUNTER — Other Ambulatory Visit: Payer: Self-pay | Admitting: *Deleted

## 2010-09-28 MED ORDER — HYDROCODONE-ACETAMINOPHEN 5-500 MG PO TABS: ORAL_TABLET | ORAL | Status: DC

## 2010-10-06 ENCOUNTER — Other Ambulatory Visit: Payer: Self-pay | Admitting: *Deleted

## 2010-10-06 MED ORDER — ALPRAZOLAM 0.5 MG PO TABS: 0.5000 mg | ORAL_TABLET | Freq: Two times a day (BID) | ORAL | Status: DC | PRN

## 2010-10-20 ENCOUNTER — Encounter: Payer: Self-pay | Admitting: Pulmonary Disease

## 2010-11-05 ENCOUNTER — Other Ambulatory Visit: Payer: Self-pay | Admitting: Pulmonary Disease

## 2010-11-15 LAB — RAPID STREP SCREEN (MED CTR MEBANE ONLY): Streptococcus, Group A Screen (Direct): NEGATIVE

## 2010-11-18 LAB — DIGOXIN LEVEL: Digoxin Level: 0.6 ng/mL — ABNORMAL LOW (ref 0.8–2.0)

## 2011-01-13 ENCOUNTER — Ambulatory Visit (INDEPENDENT_AMBULATORY_CARE_PROVIDER_SITE_OTHER): Payer: Medicare Other | Admitting: Pulmonary Disease

## 2011-01-13 ENCOUNTER — Encounter: Payer: Self-pay | Admitting: Pulmonary Disease

## 2011-01-13 DIAGNOSIS — M545 Low back pain, unspecified: Secondary | ICD-10-CM

## 2011-01-13 DIAGNOSIS — I10 Essential (primary) hypertension: Secondary | ICD-10-CM

## 2011-01-13 DIAGNOSIS — G589 Mononeuropathy, unspecified: Secondary | ICD-10-CM

## 2011-01-13 DIAGNOSIS — R32 Unspecified urinary incontinence: Secondary | ICD-10-CM

## 2011-01-13 DIAGNOSIS — K219 Gastro-esophageal reflux disease without esophagitis: Secondary | ICD-10-CM

## 2011-01-13 DIAGNOSIS — M3500 Sicca syndrome, unspecified: Secondary | ICD-10-CM

## 2011-01-13 DIAGNOSIS — IMO0001 Reserved for inherently not codable concepts without codable children: Secondary | ICD-10-CM

## 2011-01-13 DIAGNOSIS — R413 Other amnesia: Secondary | ICD-10-CM

## 2011-01-13 DIAGNOSIS — K5730 Diverticulosis of large intestine without perforation or abscess without bleeding: Secondary | ICD-10-CM

## 2011-01-13 DIAGNOSIS — E7800 Pure hypercholesterolemia, unspecified: Secondary | ICD-10-CM

## 2011-01-13 DIAGNOSIS — K589 Irritable bowel syndrome without diarrhea: Secondary | ICD-10-CM

## 2011-01-13 DIAGNOSIS — F341 Dysthymic disorder: Secondary | ICD-10-CM

## 2011-01-13 MED ORDER — CYCLOBENZAPRINE HCL 10 MG PO TABS: ORAL_TABLET | ORAL | Status: DC

## 2011-01-13 MED ORDER — HYDROCODONE-ACETAMINOPHEN 5-500 MG PO TABS: ORAL_TABLET | ORAL | Status: DC

## 2011-01-13 MED ORDER — ALPRAZOLAM 0.5 MG PO TABS: 0.5000 mg | ORAL_TABLET | Freq: Two times a day (BID) | ORAL | Status: DC | PRN

## 2011-01-13 MED ORDER — LANSOPRAZOLE 30 MG PO CPDR: 30.0000 mg | DELAYED_RELEASE_CAPSULE | Freq: Every day | ORAL | Status: DC

## 2011-01-13 MED ORDER — SERTRALINE HCL 50 MG PO TABS: ORAL_TABLET | ORAL | Status: DC

## 2011-01-13 MED ORDER — GABAPENTIN 300 MG PO CAPS: 300.0000 mg | ORAL_CAPSULE | Freq: Two times a day (BID) | ORAL | Status: DC

## 2011-01-13 MED ORDER — DILTIAZEM HCL ER COATED BEADS 180 MG PO CP24: 180.0000 mg | ORAL_CAPSULE | Freq: Every day | ORAL | Status: DC

## 2011-01-13 NOTE — Progress Notes (Signed)
Subjective:    Patient ID: Ariana [name], female    DOB: 08-Sep-1929, 77 y.o.   MRN: [id number]  HPI 77 y/o WF here for a follow up visit... he has multiple medical problems as noted below...   ~  Jul 06, 2009:  she recently saw DrDeveshwar for Rheum w/ extensive eval & labs done> Dx w/ Sjogren's syndrome/ autoimmune disease... her note is pending but pt indicates new Rx w/ Pilocarpine 5mg Bid, and considering Plaquenil rx...  she saw DrHecker 2/11 & DrLittle 1/11 - see below... she is c/o incr neuropathy symptoms in her legs and wonders what can be done for this> she is on Neurontin from DrHickling at 100mg Tid & we discussed incr to 300mg Bid...  ~  January 03, 2010:  she states she was dx w/ "autoimmune disease" by Rheum DrDeveshwar & started on Plaquenil 200mg /d now- notes from 7/11 & 10/11 reviewed... c/o abd bloating/ gas treated by GI DrGessner w/ Prevacid, Miralax, Senakot-S, OTC Simethacone (Mylicon/ Phazyme)- we discussed trial Bentyl for cramping... BP remains controlled;  denies CP, palpit, ch in SOB, etc;  notes persistant back discomfort. aches/ pains & wants Vicodin refilled;  she remains on Neurontin for her neuropathy, and Alpraz for nerves...  ~  Jul 08, 2010:  49mo ROV & she is here w/ her daughter> "it's been rough" c/o incr pain in right side of low back, right hip, right leg- prev eval DrMortenson & we discussed need for Ortho re-evaluation; she is seen regularly (every 6+mo) by Rober Minion for Rheum- Sjogren's syn, ?FM, LBP, Neuropathy> on Plaquenil, Vicodin, Flexeril, Neurontin, etc...  From the medical standpoint> BP controlled; GI stable on meds; under a lot of stress & memory loss symptoms about the same; hx mult drug side effects... Labs today look OK x elev TG=202 & advised better low fat diet...  ~  January 13, 2011:  49mo ROV & she is here w/ relative again and there is come conflict regarding her care needs etc; they mentioned DrRendall eval & attempts to get PT eval, had  home care assessment but I really didn't understand where the problem lied- ?she wanted maid service, of course medicare doesn't provide this, etc;  After long conversation we left it at calling [facility] for home PT/ OT if indeed she is eligible; they make note of the fact that DrDeveshwar is for the autoimmune dis & DrRendall is for the DDD, leg pain & he prev ordered the "therapy"; recall that she prev saw DrHickling for the Neuropathy & he started the Gabapentin...      From the medical perspective> BP well regulated on ToprolXL & Diltiazem; she denies CP, palpit, SOB or edema- ?when she saw DrLittle last & still on ASA/ Lanoxin under his direction & she denies cerebral ischemic symptoms; still trying to control lipids w/ diet alone (see below); GI stable on meds listed;  Her CC is her Sjogren's, and Neuropathy which she says is coming from her back per DrRendall- rec to incr the Neurontin back to 300mg  Bid;  Family is concerned about her depression & she no longer sees DrCottle (angry talking about their last meeting)> offered ZOLOFT 25-50mg  daily as a trial & they agree...          Problem List:  SJOGREN'S SYNDROME (ICD-710.2), & DRY EYE SYNDROME (ICD-375.15) - eval by DrHecker & DrDeveshwar 5/11 w/ extensive lab work done & Dx of Sjogren's syndrome & started on Pilocarpine 5mg Bid... DrHecker has her on a number of eye drops, &  prev thought to be dry eyes w/ ocular rosacea & tubes placed in tear ducts... also seen by Mikey Kirschner...  ~  10/11:  DrDeveshwar started PLAQUENIL 200mg /d... Seen every 11mo by Rheum. ~  We do not have any more recent notes from Rheum or Ortho...  CHRONIC ORAL DISCOMFORT - this has been looked into by mult physicians and no other etiology discovered (other than her sicca syndrome)... she uses Magic Mouthwash Prn + sialogogues...  HYPERTENSION, BENIGN (ICD-401.1) - controlled on TOPROL XL 50mg - 1/2 tabBid, & DILTIAZEM CD 180mg /d...   ~  5/12: BP= 134/80 today, takes meds  regularly & tol well... denies CP, worsening palipit, syncope, dyspnea, edema, etc... ~  11/12: BP= 122/60 & she remains asymptomatic...  MITRAL VALVE PROLAPSE (ICD-424.0) - on ASA 81mg /d, & LANOXIN 0.125mg /d and followed by DrLittle for Cardiology... his notes are reviewed> MVP, HBP, hx palpit, neg cath- Metoprolol incr to 25Bid. ~  cath 1/05 by DrLittle showed clean coronaries & norm LVF w/ +MVP... ~  1/11:  Seen by DrLittle for f/u MVP & palpit; BP was sl elev & he rec incr Metoprolol to 25mg  Bid.  HYPERLIPIDEMIA>  On low chol, low fat diet alone... ~  2008:  FLP showed TChol 170, TG 176, HDL 50,  ~  5/12:  FLP showed TChol 142, TG 202, HDL 62, LDL 166  GERD (ICD-530.81) - on PREVACID 30mg /d... denies reflux symptoms, swallowing OK x for dryness, etc... ~  last EGD 9/02 showed 4cmHH, GERD...  DIVERTICULOSIS OF COLON (ICD-562.10),  IRRITABLE BOWEL SYNDROME (ICD-564.1), & Family Hx of COLON CANCER (ICD-153.9) - followed for GI by DrSamLeBauer- now DrGessner- colonoscopy 2/06 showed a tortuous and spastic colon, divertics, otherw neg... her sister died in her 60's w/ colon cancer... f/u colonoscopy 4/10 showed mod divertics, hems, melanosis> Rx'd w/ Miralax/ Senakot-S. ~  11/11:  she notes incr IBS symptoms- rec Rx w/ BENTYL 20mg Qid Prn, Mylicon, Phazyme ==> improved.  URINARY INCONTINENCE (ICD-788.30) - eval by GYN= DrRNeal (on Premarin 0.9mg /d)...  LOW BACK PAIN SYNDROME (ICD-724.2) - on VICODIN up to 3/ day... Ortho= DrMortenson, Rendall, et al... ~  5/12:  She is c/o incr pain in right side of back/ hip/ leg> refer to Ortho for further eval; Rx Vicodin prn...  FIBROMYALGIA (ICD-729.1) - she has chr fatigue symptoms and diffuse aches & pains... she has been eval by Rheum DrDeveshwar w/ recent dx of Sjogren's Syndrome & DDD- on CYCLOBENZAPRINE 10mg - 1/2-1 tab Prn...  MEMORY LOSS (ICD-780.93) - eval by DrHickling for Neurology w/ MRI done- pt states "it's not dementia, just hardening of the  arteries"... his note from 4/08 indicates- OBS, gait abn & neck/ Back pain diagnoses...  NEUROPATHY (ICD-355.9) - on GABAPENTIN 300mg Bid per DrHickling, Neurology... neuropathy symptoms improved on Rx.  ANXIETY DEPRESSION (ICD-300.4) - she take ALPRAZOLAM 0.5mg Bid as needed... prev Psychiatric Rx from DrCottle but she hasn't seen him in >40yr & refuses to return... Family requests med for depression & we discussed trial ZOLOFT 25 to 50mg  /d...  Hx of ADVERSE DRUG REACTION (ICD-995.20) - hx of mult drug allergies and intolerances including:  PCN, Sulfa, Doxy, NSAIDs, Demerol, Codeine, DCN100, Tramadol, Phenergan, Thorazine, Stelazine...  NOTE: she takes Vicodin for pain, and tolerates this satis...  Health Maintenance - GYN= DrNeal on Premarin, Calcium, Vits, & Fish Oil...   Past Surgical History  Procedure Date  . Vesicovaginal fistula closure w/ tah   . Appendectomy   . Cholecystectomy   . Mandible surgery   . Temporomandibular  joint surgery   . Cataract extraction, bilateral     Outpatient Encounter Prescriptions as of 01/13/2011  Medication Sig Dispense Refill  . ALPRAZolam (XANAX) 0.5 MG tablet Take 1 tablet (0.5 mg total) by mouth 2 (two) times daily as needed.  60 tablet  5  . aspirin 81 MG tablet Take 81 mg by mouth daily.        . Calcium Carbonate-Vitamin D (CALTRATE 600+D) 600-400 MG-UNIT per tablet Take 2 tablets by mouth daily.        . carboxymethylcellulose (REFRESH TEARS) 0.5 % SOLN 1 drop 2 (two) times daily.        . cyclobenzaprine (FLEXERIL) 10 MG tablet TAKE 1/2 TO 1 TABLET BY MOUTH 4 TIMES DAILY AS NEEDED FOR MUSCLE SPASM  120 tablet  5  . cycloSPORINE (RESTASIS) 0.05 % ophthalmic emulsion 1 drop as directed.        . dicyclomine (BENTYL) 20 MG tablet Take 20 mg by mouth every 6 (six) hours. For abdominal cramping       . digoxin (LANOXIN) 0.125 MG tablet Take 125 mcg by mouth daily.        Marland Kitchen diltiazem (CARDIZEM CD) 180 MG 24 hr capsule Take 180 mg by mouth daily.         . Diphenhyd-Hydrocort-Nystatin SUSP 1 tsp gargle and swallow four times a day as needed  120 mL  5  . estrogens, conjugated, (PREMARIN) 0.9 MG tablet Take 0.9 mg by mouth daily. Take daily for 21 days then do not take for 7 days.       . furosemide (LASIX) 20 MG tablet Take 1 tablet (20 mg total) by mouth daily. Every morning as needed for swelling  30 tablet  11  . gabapentin (NEURONTIN) 300 MG capsule TAKE ONE CAPSULE BY MOUTH TWICE A DAY  60 capsule  6  . HYDROcodone-acetaminophen (VICODIN) 5-500 MG per tablet Take 1 tablet by mouth three times daily as needed for pain--DO NOT EXCEED 3 PER DAY.  90 tablet  5  . hydroxychloroquine (PLAQUENIL) 200 MG tablet Take 200 mg by mouth daily. As directed by Dr. Corliss Skains       . lansoprazole (PREVACID) 30 MG capsule TAKE ONE CAPSULE BY MOUTH EVERY DAY  30 capsule  3  . metoprolol (TOPROL-XL) 50 MG 24 hr tablet 50 mg. Take 1/2 tablet by  Mouth two times a day       . Multiple Vitamin (MULTIVITAMIN) tablet Take 1 tablet by mouth daily.        . Omega-3 Fatty Acids (FISH OIL) 1000 MG CAPS Take 1 capsule by mouth daily.        . pilocarpine (SALAGEN) 5 MG tablet Take 5 mg by mouth 2 (two) times daily. As directed by Dr. Corliss Skains       . polyethylene glycol (MIRALAX / GLYCOLAX) packet Take 17 g by mouth daily.        . sennosides-docusate sodium (SENOKOT-S) 8.6-50 MG tablet 1-2 tabs by mouth at bedtime         Allergies  Allergen Reactions  . Codeine     REACTION: nausea unless given with phenergan  . Doxycycline   . Lubiprostone     REACTION: red rash  . Meperidine Hcl     REACTION: nausea unless given with phenergan  . Naproxen   . Oxycodone-Acetaminophen     REACTION: nausea unless given with phenergan  . Penicillins   . Propoxyphene Hcl     REACTION:  nausea unless given with phenergan  . Sulfamethoxazole W/Trimethoprim   . Tramadol     Current Medications, Allergies, Past Medical History, Past Surgical History, Family History, and  Social History were reviewed in [employer] record.    Review of Systems        See HPI - all other systems neg except as noted... The patient complains of decreased hearing, hoarseness, dyspnea on exertion, and abdominal pain.  The patient denies anorexia, fever, weight loss, weight gain, vision loss, chest pain, syncope, peripheral edema, prolonged cough, headaches, hemoptysis, melena, hematochezia, severe indigestion/heartburn, hematuria, incontinence, muscle weakness, suspicious skin lesions, transient blindness, difficulty walking, depression, unusual weight change, abnormal bleeding, enlarged lymph nodes, and angioedema.     Objective:   Physical Exam      WD, WN, 77 y/o WF in NAD... she has dry eyes & prominent dry mouth symptoms... GENERAL:  Alert & oriented; pleasant & cooperative...  HEENT:  [place]/AT, EOM-wnl, PERRLA, EACs-clear, TMs-wnl, NOSE-clear, THROAT-clear & excessively dry. NECK:  Supple w/ fairROM; no JVD; normal carotid impulses w/o bruits; no thyromegaly or nodules palpated; no lymphadenopathy. CHEST:  Clear to P & A; without wheezes/ rales/ or rhonchi. HEART:  Regular Rhythm; without murmurs/ rubs/ or gallops heard... ABDOMEN:  Soft & nontender; normal bowel sounds; no organomegaly or masses detected. EXT: without deformities, mild arthritic changes; no varicose veins/ venous insuffic/ or edema. NEURO:  CN's intact;  no focal neuro deficits... DERM:  No lesions noted; no rash etc...   Assessment & Plan:   Right sided back/ hip/ leg pain>  Mult rheumatologic diagnoses, refer to DrRendall & DrDeveshwar for further studies and pain management...  SJOGREN's>  Still w/ very dry Sicca syndrome & medication from DrDeveshwar, Ophthalmology, etc...  HBP>  Controlled on meds, continue same...  MVP>  Followed by DrLittle for Cards, & stable on meds above...  HYPERLIPID>  TG still sl elev & rec to get on better low fat diet...  GI>  GERD, HH,  Divertics, IBS, +FemHx colon ca>  Stable on Prev30, last colon 2010- OK, Bentyl helps...  Chronic Pain Syndrome>  She states adeq control w/ Vicodin Rx...  Memory loss/ anxiety/ depression>  Prev eval by Neuro- DrHickling, DrCottle for psyche;  Only on alpraz at present.Marland KitchenMarland Kitchen

## 2011-01-13 NOTE — Patient Instructions (Signed)
Today we updated your med list in our EPIC system...    Continue your current medications the same...   We decided to try ZOLOFT (Sertraline) 50mg  tabs for your depression:    Start w/ 1/2 tab daily for about 3 weeks then increase to 1 tab daily...  Call for any questions...    Let us know how it is working for you & if you think you need a further increase in dose...  Let's plan a follow up visit w/ blood work etc in 6 months.Marland KitchenMarland Kitchen

## 2011-01-21 ENCOUNTER — Other Ambulatory Visit: Payer: Self-pay | Admitting: Pulmonary Disease

## 2011-01-25 ENCOUNTER — Telehealth: Payer: Self-pay | Admitting: Pulmonary Disease

## 2011-01-25 DIAGNOSIS — G589 Mononeuropathy, unspecified: Secondary | ICD-10-CM

## 2011-01-25 DIAGNOSIS — M545 Low back pain, unspecified: Secondary | ICD-10-CM

## 2011-01-25 NOTE — Telephone Encounter (Signed)
I spoke with Marylu Lund and she states they cut pt's gabapentin 300 mg back to once a day instead of twice a day. She states when pt was taking it twice a day se was groggy and slurring her speech. She states since pt has cut down to once a day she is doing better. Riven states Dr. Christia Reading had pt on Gabapentin 100 mg TID and is wanting to know if Dr. Kriste Basque feels this would be better for pt to be on the lower dose and space it out through the day. Also she states pt is currently taking zoloft 1/2 tablet daily and feels sleepy during the day. Please advise Dr. Kriste Basque, thanks

## 2011-01-26 MED ORDER — GABAPENTIN 100 MG PO CAPS: 100.0000 mg | ORAL_CAPSULE | Freq: Three times a day (TID) | ORAL | Status: DC

## 2011-01-26 NOTE — Telephone Encounter (Signed)
Returning call.

## 2011-01-26 NOTE — Telephone Encounter (Signed)
LMOMTCB x 1 

## 2011-01-26 NOTE — Telephone Encounter (Signed)
Called and spoke with pt's daughter. Informed her of SN's recs regarding the zoloft and gabapentin.  She verbalized understanding and requested rx for Gabapentin be called into pharmacy.  Rx sent.  Daughter aware.  Daughter also had 2 other concerns for SN:  1) She states pt has been c/o having "strange dreams" for awhile now and wanted to know if the Zoloft could be causing this Daughter is aware SN out of office until Monday and is ok to wait for a response then.  SN, please advise.  Thanks.  2) Daughter states they are still waiting to hear from someone regarding getting pt set up for PT and OT. Looks like SN sent order to [facility] on 01/13/11  For this.  PCCs, please advise.  Thanks.

## 2011-01-26 NOTE — Telephone Encounter (Signed)
Per SN---take the 1/2 tablet of zoloft in the evening.   Ok to change the gabapentin 100mg    #90  1 po tid.  thanks

## 2011-01-30 NOTE — Telephone Encounter (Signed)
Returned daughter's call Marylu Lund) and advised that [facility] has an appointment scheduled with patient for tomorrow Tues. 01/31/11. Daughter requested that all appointments need to be arranged through pt's daughter. Called Lecretia with [facility] and gave daughter Lavender's contact number and advised for all future appointments with this patient to contact pt's daughter.

## 2011-01-30 NOTE — Telephone Encounter (Signed)
Called and spoke with Ariana [name]----pts daughter--and she is aware of SN recs but the daughter stated that her husband is around the pt all the time and he stated that the pt has been doing this all year long---only happens when she gets woken up abruptly or disoriented when he wakes her up to give her the meds---she is going to cont the zoloft for now and keep a check to make sure no other changes occur with her mother.  Daughter stated that this is the last week on the 1/2 tablet of the zoloft and pt will increase to 1 full tablet daily next week.  Daughter will keep an eye on her mother for any changes.

## 2011-01-30 NOTE — Telephone Encounter (Signed)
Called Lecretia from [facility] to check on status of previous submitted referral for PT/OT. PT is going out to pt's home on 01/31/11.

## 2011-01-30 NOTE — Telephone Encounter (Signed)
Order placed for PT/OT sent to the [facility]--per SN--unknown if zoloft causes strange dreams--only way to tell that we know of is to stop the zoloft completely for 1 month and see if she is better.  So stop the zoloft now and call use after 02/14/11 to let us know how she is doing.  LMOMTCB for the pts daughter.

## 2011-02-13 ENCOUNTER — Other Ambulatory Visit: Payer: Self-pay | Admitting: Pulmonary Disease

## 2011-02-20 ENCOUNTER — Telehealth: Payer: Self-pay | Admitting: Pulmonary Disease

## 2011-02-20 NOTE — Telephone Encounter (Signed)
I spoke with Jesusita Oka from [facility] and gave verbal okay for rolling walker w/ seat (per leigh okay to do). Nothing further was needed

## 2011-02-21 ENCOUNTER — Telehealth: Payer: Self-pay | Admitting: Pulmonary Disease

## 2011-02-21 NOTE — Telephone Encounter (Signed)
lmomtcb for Ariana [name] 

## 2011-02-22 NOTE — Telephone Encounter (Signed)
Spoke with Ariana [name] and advised okay for the pt to have a bedside commode. She verbalized understanding and states nothing further needed.

## 2011-02-22 NOTE — Telephone Encounter (Signed)
LMTCB X2

## 2011-04-08 ENCOUNTER — Emergency Department (HOSPITAL_BASED_OUTPATIENT_CLINIC_OR_DEPARTMENT_OTHER)
Admission: EM | Admit: 2011-04-08 | Discharge: 2011-04-08 | Disposition: A | Discharge status: Home / Self Care | Payer: Medicare Other | Attending: Emergency Medicine | Admitting: Emergency Medicine

## 2011-04-08 ENCOUNTER — Encounter (HOSPITAL_BASED_OUTPATIENT_CLINIC_OR_DEPARTMENT_OTHER): Payer: Self-pay | Admitting: *Deleted

## 2011-04-08 ENCOUNTER — Emergency Department (INDEPENDENT_AMBULATORY_CARE_PROVIDER_SITE_OTHER): Payer: Medicare Other

## 2011-04-08 ENCOUNTER — Other Ambulatory Visit: Payer: Self-pay

## 2011-04-08 DIAGNOSIS — R079 Chest pain, unspecified: Secondary | ICD-10-CM | POA: Insufficient documentation

## 2011-04-08 DIAGNOSIS — F419 Anxiety disorder, unspecified: Secondary | ICD-10-CM

## 2011-04-08 DIAGNOSIS — K219 Gastro-esophageal reflux disease without esophagitis: Secondary | ICD-10-CM | POA: Insufficient documentation

## 2011-04-08 DIAGNOSIS — IMO0001 Reserved for inherently not codable concepts without codable children: Secondary | ICD-10-CM | POA: Insufficient documentation

## 2011-04-08 DIAGNOSIS — R0602 Shortness of breath: Secondary | ICD-10-CM | POA: Insufficient documentation

## 2011-04-08 DIAGNOSIS — Z79899 Other long term (current) drug therapy: Secondary | ICD-10-CM | POA: Insufficient documentation

## 2011-04-08 DIAGNOSIS — I10 Essential (primary) hypertension: Secondary | ICD-10-CM | POA: Insufficient documentation

## 2011-04-08 DIAGNOSIS — K589 Irritable bowel syndrome without diarrhea: Secondary | ICD-10-CM | POA: Insufficient documentation

## 2011-04-08 DIAGNOSIS — F341 Dysthymic disorder: Secondary | ICD-10-CM | POA: Insufficient documentation

## 2011-04-08 DIAGNOSIS — F411 Generalized anxiety disorder: Secondary | ICD-10-CM | POA: Insufficient documentation

## 2011-04-08 LAB — CBC
HCT: 34.5 % — ABNORMAL LOW (ref 36.0–46.0)
MCH: 31.5 pg (ref 26.0–34.0)
MCV: 89.1 fL (ref 78.0–100.0)
Platelets: 224 10*3/uL (ref 150–400)
RDW: 11.2 % — ABNORMAL LOW (ref 11.5–15.5)
WBC: 8.9 10*3/uL (ref 4.0–10.5)

## 2011-04-08 LAB — BASIC METABOLIC PANEL
BUN: 15 mg/dL (ref 6–23)
Calcium: 9.6 mg/dL (ref 8.4–10.5)
Chloride: 90 mEq/L — ABNORMAL LOW (ref 96–112)
Creatinine, Ser: 0.6 mg/dL (ref 0.50–1.10)
GFR calc Af Amer: >90 mL/min (ref >90)

## 2011-04-08 MED ORDER — ONDANSETRON HCL 4 MG/2ML IJ SOLN
4.0000 mg | Freq: Once | INTRAMUSCULAR | Status: AC
  Administered 2011-04-08: 4 mg via INTRAVENOUS
  Filled 2011-04-08: qty 2

## 2011-04-08 MED ORDER — LORAZEPAM 2 MG/ML IJ SOLN
0.5000 mg | Freq: Once | INTRAMUSCULAR | Status: AC
  Administered 2011-04-08: 0.5 mg via INTRAVENOUS
  Filled 2011-04-08: qty 1

## 2011-04-08 MED ORDER — MORPHINE SULFATE 4 MG/ML IJ SOLN
4.0000 mg | Freq: Once | INTRAMUSCULAR | Status: AC
  Administered 2011-04-08: 4 mg via INTRAVENOUS
  Filled 2011-04-08: qty 1

## 2011-04-08 MED ORDER — ASPIRIN 81 MG PO CHEW
324.0000 mg | CHEWABLE_TABLET | Freq: Once | ORAL | Status: AC
  Administered 2011-04-08: 324 mg via ORAL

## 2011-04-08 MED ORDER — ASPIRIN 81 MG PO CHEW
CHEWABLE_TABLET | ORAL | Status: AC
  Administered 2011-04-08: 324 mg via ORAL
  Filled 2011-04-08: qty 4

## 2011-04-08 MED ORDER — ASPIRIN 325 MG PO TABS: 325.0000 mg | ORAL_TABLET | ORAL | Status: DC

## 2011-04-08 NOTE — ED Notes (Signed)
Pt placed on bedpan- voided w/o difficulty

## 2011-04-08 NOTE — Discharge Instructions (Signed)

## 2011-04-08 NOTE — ED Notes (Signed)
Pt has hx of Sjogren's and s/s have flared up since yesterday Mouth dry.

## 2011-04-08 NOTE — ED Provider Notes (Signed)
History     CSN: [id number]  Arrival date & time 04/08/11  1427   First MD Initiated Contact with Patient 04/08/11 1450      Chief Complaint  Patient presents with  . Shortness of Breath    (Consider location/radiation/quality/duration/timing/severity/associated sxs/prior treatment) HPI Comments: Pt states that she has been having cp for the last couple of days which is similar to what she had a couple of weeks ago when she went to see Dr. Clarene Duke her cardiologist and was told that everything is fine:pt states that the cp got worse this morning when her daughter came and checked on her:pt states that she is having tingling in her extremities is sob:pt has not tried anything at home:pt denies diaphoresis,n/v,fever,cough  Patient is a 78 y.o. female presenting with shortness of breath. The history is provided by the patient. No language interpreter was used.  Shortness of Breath  The current episode started 2 days ago. The onset was sudden. The problem occurs occasionally. The problem has been unchanged. The problem is moderate. The symptoms are relieved by nothing. Associated symptoms include chest pain and shortness of breath. Pertinent negatives include no fever.    Past Medical History  Diagnosis Date  . Sjogren's syndrome   . Dry eye syndrome   . Hypertension, benign   . Mitral valve prolapse   . GERD (gastroesophageal reflux disease)   . Diverticulosis of colon   . Irritable bowel syndrome   . Urinary incontinence   . Low back pain syndrome   . Fibromyalgia   . Memory loss   . Neuropathy   . Anxiety and depression   . History of adverse drug reaction     Past Surgical History  Procedure Date  . Vesicovaginal fistula closure w/ tah   . Appendectomy   . Cholecystectomy   . Mandible surgery   . Temporomandibular joint surgery   . Cataract extraction, bilateral     Family History  Problem Relation Age of Onset  . Colon cancer    . Heart disease Father   .  Pneumonia Mother   . Heart attack Mother   . Cancer Sister   . Cancer Brother     History  Substance Use Topics  . Smoking status: Former Games developer  . Smokeless tobacco: Not on file  . Alcohol Use: No    OB History    Grav Para Term Preterm Abortions TAB SAB Ect Mult Living                  Review of Systems  Constitutional: Negative for fever.  Respiratory: Positive for shortness of breath.   Cardiovascular: Positive for chest pain.  All other systems reviewed and are negative.    Allergies  Codeine; Doxycycline; Lubiprostone; Meperidine hcl; Naproxen; Oxycodone-acetaminophen; Penicillins; Propoxyphene hcl; Sulfamethoxazole w/trimethoprim; and Tramadol  Home Medications   Current Outpatient Rx  Name Route Sig Dispense Refill  . ALPRAZOLAM 0.5 MG PO TABS Oral Take 1 tablet (0.5 mg total) by mouth 2 (two) times daily as needed. 60 tablet 5  . ASPIRIN 81 MG PO TABS Oral Take 81 mg by mouth daily.      Marland Kitchen CALCIUM CARBONATE-VITAMIN D 600-400 MG-UNIT PO TABS Oral Take 2 tablets by mouth daily.      Marland Kitchen CARBOXYMETHYLCELLULOSE SODIUM 0.5 % OP SOLN  1 drop 2 (two) times daily.      . CYCLOBENZAPRINE HCL 10 MG PO TABS  Take 1/2 to 1 tablet by mouth 4 times  daily as needed for msucle spasm 120 tablet 5  . CYCLOSPORINE 0.05 % OP EMUL  1 drop as directed.      Marland Kitchen DICYCLOMINE HCL 20 MG PO TABS  TAKE 1 TABLET EVERY 6 HOURS AS NEEDED FOR ABDOMINAL CRAMPING 50 tablet 10  . DIGOXIN 0.125 MG PO TABS Oral Take 125 mcg by mouth daily.      Marland Kitchen DILTIAZEM HCL ER COATED BEADS 180 MG PO CP24 Oral Take 1 capsule (180 mg total) by mouth daily. 30 capsule 11  . DIPHENHYD-HYDROCORT-NYSTATIN MT SUSP  1 tsp gargle and swallow four times a day as needed 120 mL 5  . ESTROGENS CONJUGATED 0.9 MG PO TABS Oral Take 0.9 mg by mouth daily. Take daily for 21 days then do not take for 7 days.     . FUROSEMIDE 20 MG PO TABS  Take one tablet by mouth every other day     . GABAPENTIN 100 MG PO CAPS Oral Take 1 capsule  (100 mg total) by mouth 3 (three) times daily. 90 capsule 5  . HYDROCODONE-ACETAMINOPHEN 5-500 MG PO TABS  Take 1 tablet by mouth three times daily as needed for pain--DO NOT EXCEED 3 PER DAY. 90 tablet 5  . HYDROXYCHLOROQUINE SULFATE 200 MG PO TABS Oral Take 200 mg by mouth daily. As directed by Dr. Corliss Skains     . LANSOPRAZOLE 30 MG PO CPDR Oral Take 1 capsule (30 mg total) by mouth daily. 30 capsule 11  . LANSOPRAZOLE 30 MG PO CPDR  TAKE ONE CAPSULE BY MOUTH EVERY DAY 30 capsule 3  . METOPROLOL SUCCINATE ER 50 MG PO TB24  50 mg. Take 1/2 tablet by  Mouth two times a day     . ONE-DAILY MULTI VITAMINS PO TABS Oral Take 1 tablet by mouth daily.      Marland Kitchen FISH OIL 1000 MG PO CAPS Oral Take 1 capsule by mouth daily.      Marland Kitchen PILOCARPINE HCL 5 MG PO TABS Oral Take 5 mg by mouth 2 (two) times daily. As directed by Dr. Corliss Skains     . POLYETHYLENE GLYCOL 3350 PO PACK Oral Take 17 g by mouth daily.      . SENNA-DOCUSATE SODIUM 8.6-50 MG PO TABS  1-2 tabs by mouth at bedtime     . SERTRALINE HCL 50 MG PO TABS  Take 1/2 tablet daily for 3 weeks then one tablet daily thereafter 30 tablet 6    BP 154/68  Pulse 84  Temp(Src) 98.2 F (36.8 C) (Oral)  Resp 28  Ht 5' 3.5" (1.613 m)  Wt 122 lb (55.339 kg)  BMI 21.27 kg/m2  SpO2 99%  Physical Exam  Nursing note and vitals reviewed. Constitutional: She is oriented to person, place, and time. She appears well-developed and well-nourished. She appears distressed.  HENT:  Head: Normocephalic and atraumatic.  Neck: Neck supple.  Cardiovascular: Normal rate and regular rhythm.   Pulmonary/Chest: Effort normal and breath sounds normal.  Abdominal: Soft. Bowel sounds are normal. There is no tenderness.  Musculoskeletal: Normal range of motion.  Neurological: She is alert and oriented to person, place, and time.  Skin: Skin is warm and dry.  Psychiatric: Her mood appears anxious.    ED Course  Procedures (including critical care time)  Labs Reviewed    CBC - Abnormal; Notable for the following:    HCT 34.5 (*)    RDW 11.2 (*)    All other components within normal limits  BASIC METABOLIC  PANEL - Abnormal; Notable for the following:    Sodium 129 (*)    Chloride 90 (*)    Glucose, Bld 114 (*)    GFR calc non Af Amer 84 (*)    All other components within normal limits  TROPONIN I  D-DIMER, QUANTITATIVE   Dg Chest 2 View  04/08/2011  *RADIOLOGY REPORT*  Clinical Data: 78 year old female with chest pain.  CHEST - 2 VIEW  Comparison: 01/09/2008  Findings: Cardiomegaly is identified. There is no evidence of focal airspace disease, pulmonary edema, suspicious pulmonary nodule/mass, pleural effusion, or pneumothorax. No acute bony abnormalities are identified.  IMPRESSION: No evidence of acute cardiopulmonary disease.  Original Report Authenticated By: Rosendo Gros, M.D.  . Date: 04/08/2011  Rate: 86  Rhythm: normal sinus rhythm  QRS Axis: normal  Intervals: normal  ST/T Wave abnormalities: nonspecific ST/T changes  Conduction Disutrbances:left anterior fascicular block  Narrative Interpretation:   Old EKG Reviewed: unchanged    1. Anxiety       MDM  5:02 PM On reassessment of the pt she states that her son dried 2 weeks ago and she has not been getting along with her daughter:pt state that she feels very alone:pt denies si/hi 5:55 PM Pt is feeling better at this time:pt is very tearful:will send home:pt has pcp and cardiology as needed        Teressa Lower, NP 04/08/11 1802

## 2011-04-08 NOTE — ED Provider Notes (Signed)
Medical screening examination/treatment/procedure(s) were conducted as a shared visit with non-physician practitioner(s) and myself.  I personally evaluated the patient during the encounter  Pt with anxiety, given meds and feels better  Toy Baker, MD 04/08/11 [phone]

## 2011-04-09 NOTE — ED Provider Notes (Signed)
Medical screening examination/treatment/procedure(s) were performed by non-physician practitioner and as supervising physician I was immediately available for consultation/collaboration.   Gwendalyn Mcgonagle A Nur Krasinski, MD 04/09/11 0713 

## 2011-04-14 ENCOUNTER — Telehealth: Payer: Self-pay | Admitting: Pulmonary Disease

## 2011-04-14 NOTE — Telephone Encounter (Signed)
lmtcb

## 2011-04-17 MED ORDER — CLONAZEPAM 1 MG PO TABS: 1.0000 mg | ORAL_TABLET | Freq: Two times a day (BID) | ORAL | Status: DC

## 2011-04-17 NOTE — Telephone Encounter (Signed)
I spoke with the pt daughter and she states the pt has been having panic attacks, and is becoming very combative to her and her husband as well as other visitors to her home. She states they took her to the ER in [place] last week because she was c/o having very dry mouth, but when they went there she had a panic attack in the ER while talking to the doctor. She also states that she is talking about things that did not happen and accusing them of things they did not do. I advised that I feel they need an appt to discuss all this, but daughter states that when they come in for OV she cannot discuss these issues in front of the pt because when they leave she becomes very angry and combative with her, so she does not feel an appt will do any good. She also states that her anxiety is much worse when they try to leave the house so she states she does not feel she can get her to agree to come to OV. Also they ONLY want to see Dr. Kriste Basque.   Of note, the pt is on xanax 0.5mg  twice daily. She was started on zoloft but stopped this due to it making her sleepy.  Please advise. Carron Curie, CMA Allergies  Allergen Reactions  . Codeine Nausea Only    unless given with Phenergan  . Doxycycline   . Meperidine Hcl Nausea Only    unless given with Phenergan  . Naproxen   . Norflex (Orphenadrine Citrate) Nausea Only    Unless given with Phenergan  . Oxycodone-Acetaminophen Nausea Only    unless given with phenergan  . Penicillins   . Phenothiazines   . Propoxyphene Hcl Nausea Only    unless given with phenergan  . Stelazine   . Sulfamethoxazole W/Trimethoprim   . Tolectin (Tolmetin Sodium)   . Tramadol   . Lubiprostone Rash

## 2011-04-17 NOTE — Telephone Encounter (Signed)
Per SN-best med or anxiety is klonpin 1mg  twice daily #60. Rx called in. Pt daughter is aware.  Carron Curie, CMA

## 2011-04-26 ENCOUNTER — Telehealth: Payer: Self-pay | Admitting: Pulmonary Disease

## 2011-04-26 NOTE — Telephone Encounter (Signed)
I spoke with Tresa Endo from [facility] and was advised pt did not have any refills on her vicodin left. I advised her that rx was given to pt on 01/13/11 #90 x 5 refills. She searched the system and found the rx on hold. She stated she will go ahead and get this ready for pt since this was last filled 02/28/11. I called and spoke with daughter and made her aware of this and had no questions. Nothing further was needed

## 2011-05-22 ENCOUNTER — Other Ambulatory Visit: Payer: Self-pay | Admitting: Pulmonary Disease

## 2011-07-14 ENCOUNTER — Ambulatory Visit (INDEPENDENT_AMBULATORY_CARE_PROVIDER_SITE_OTHER): Payer: Medicare Other | Admitting: Pulmonary Disease

## 2011-07-14 ENCOUNTER — Other Ambulatory Visit (INDEPENDENT_AMBULATORY_CARE_PROVIDER_SITE_OTHER): Payer: Medicare Other

## 2011-07-14 ENCOUNTER — Encounter: Payer: Self-pay | Admitting: Pulmonary Disease

## 2011-07-14 VITALS — BP 122/70 | HR 72 | Temp 97.0°F | Ht 63.5 in | Wt 116.6 lb

## 2011-07-14 DIAGNOSIS — I059 Rheumatic mitral valve disease, unspecified: Secondary | ICD-10-CM

## 2011-07-14 DIAGNOSIS — K5730 Diverticulosis of large intestine without perforation or abscess without bleeding: Secondary | ICD-10-CM

## 2011-07-14 DIAGNOSIS — M3500 Sicca syndrome, unspecified: Secondary | ICD-10-CM

## 2011-07-14 DIAGNOSIS — F0390 Unspecified dementia without behavioral disturbance: Secondary | ICD-10-CM

## 2011-07-14 DIAGNOSIS — M545 Low back pain, unspecified: Secondary | ICD-10-CM

## 2011-07-14 DIAGNOSIS — I10 Essential (primary) hypertension: Secondary | ICD-10-CM

## 2011-07-14 DIAGNOSIS — F341 Dysthymic disorder: Secondary | ICD-10-CM

## 2011-07-14 DIAGNOSIS — K219 Gastro-esophageal reflux disease without esophagitis: Secondary | ICD-10-CM

## 2011-07-14 DIAGNOSIS — IMO0001 Reserved for inherently not codable concepts without codable children: Secondary | ICD-10-CM

## 2011-07-14 DIAGNOSIS — K589 Irritable bowel syndrome without diarrhea: Secondary | ICD-10-CM

## 2011-07-14 DIAGNOSIS — G589 Mononeuropathy, unspecified: Secondary | ICD-10-CM

## 2011-07-14 LAB — HEPATIC FUNCTION PANEL
ALT: 15 U/L (ref 0–35)
Albumin: 4.2 g/dL (ref 3.5–5.2)
Alkaline Phosphatase: 55 U/L (ref 39–117)
Bilirubin, Direct: 0.1 mg/dL (ref 0.0–0.3)
Total Protein: 7.6 g/dL (ref 6.0–8.3)

## 2011-07-14 LAB — CBC WITH DIFFERENTIAL/PLATELET
Basophils Absolute: 0 10*3/uL (ref 0.0–0.1)
Eosinophils Absolute: 0.1 10*3/uL (ref 0.0–0.7)
Lymphocytes Relative: 23 % (ref 12.0–46.0)
MCHC: 33.3 g/dL (ref 30.0–36.0)
Monocytes Relative: 5.7 % (ref 3.0–12.0)
Neutrophils Relative %: 69.7 % (ref 43.0–77.0)
Platelets: 247 10*3/uL (ref 150.0–400.0)
RDW: 12 % (ref 11.5–14.6)

## 2011-07-14 LAB — URINALYSIS, ROUTINE W REFLEX MICROSCOPIC
Bilirubin Urine: NEGATIVE
Hgb urine dipstick: NEGATIVE
Ketones, ur: NEGATIVE
Specific Gravity, Urine: <=1.005 (ref 1.000–1.030)
Urine Glucose: NEGATIVE
Urobilinogen, UA: 0.2 (ref 0.0–1.0)

## 2011-07-14 LAB — BASIC METABOLIC PANEL
CO2: 30 mEq/L (ref 19–32)
Chloride: 92 mEq/L — ABNORMAL LOW (ref 96–112)
Glucose, Bld: 105 mg/dL — ABNORMAL HIGH (ref 70–99)
Sodium: 132 mEq/L — ABNORMAL LOW (ref 135–145)

## 2011-07-14 LAB — IBC PANEL
Iron: 123 ug/dL (ref 42–145)
Transferrin: 294.2 mg/dL (ref 212.0–360.0)

## 2011-07-14 LAB — TSH: TSH: 1.56 u[IU]/mL (ref 0.35–5.50)

## 2011-07-14 MED ORDER — DONEPEZIL HCL 10 MG PO TABS: ORAL_TABLET | ORAL | Status: DC

## 2011-07-14 NOTE — Progress Notes (Signed)
Subjective:    Patient ID: Ariana [name], female    DOB: 1929-10-21, 78 y.o.   MRN: [id number]  HPI 78 y/o WF here for a follow up visit... he has multiple Ariana problems as noted below...   ~  Jul 06, 2009:  she recently saw DrDeveshwar for Rheum w/ extensive eval & labs done> Dx w/ Sjogren's syndrome/ autoimmune disease... her note is pending but pt indicates new Rx w/ Pilocarpine 5mg Bid, and considering Plaquenil rx...  she saw DrHecker 2/11 & DrLittle 1/11 - see below... she is c/o incr neuropathy symptoms in her legs and wonders what can be done for this> she is on Neurontin from DrHickling at 100mg Tid & we discussed incr to 300mg Bid...  ~  January 03, 2010:  she states she was dx w/ "autoimmune disease" by Rheum DrDeveshwar & started on Plaquenil 200mg /d now- notes from 7/11 & 10/11 reviewed... c/o abd bloating/ gas treated by GI DrGessner w/ Prevacid, Miralax, Senakot-S, OTC Simethacone (Mylicon/ Phazyme)- we discussed trial Bentyl for cramping... BP remains controlled;  denies CP, palpit, ch in SOB, etc;  notes persistant back discomfort. aches/ pains & wants Vicodin refilled;  she remains on Neurontin for her neuropathy, and Alpraz for nerves...  ~  Jul 08, 2010:  45mo ROV & she is here w/ her daughter> "it's been rough" c/o incr pain in right side of low back, right hip, right leg- prev eval DrMortenson & we discussed need for Ortho re-evaluation; she is seen regularly (every 6+mo) by Ariana [name] for Rheum- Sjogren's syn, ?FM, LBP, Neuropathy> on Plaquenil, Vicodin, Flexeril, Neurontin, etc...  From the Ariana standpoint> BP controlled; GI stable on meds; under a lot of stress & memory loss symptoms about the same; hx mult drug side effects... Labs today look OK x elev TG=202 & advised better low fat diet...  ~  January 13, 2011:  45mo ROV & she is here w/ relative again and there is come conflict regarding her care needs etc; they mentioned DrRendall eval & attempts to get PT eval, had  home care assessment but I really didn't understand where the problem lied- ?she wanted maid service, of course medicare doesn't provide this, etc;  After long conversation we left it at calling Ariana [name] for home PT/ OT if indeed she is eligible; they make note of the fact that DrDeveshwar is for the autoimmune dis & DrRendall is for the DDD, leg pain & he prev ordered the "therapy"; recall that she prev saw DrHickling for the Neuropathy & he started the Gabapentin...      From the Ariana perspective> BP well regulated on ToprolXL & Diltiazem; she denies CP, palpit, SOB or edema- ?when she saw DrLittle last & still on ASA/ Lanoxin under his direction & she denies cerebral ischemic symptoms; still trying to control lipids w/ diet alone (see below); GI stable on meds listed;  Her CC is her Sjogren's, and Neuropathy which she says is coming from her back per DrRendall- rec to incr the Neurontin back to 300mg  Bid;  Family is concerned about her depression & she no longer sees DrCottle (angry talking about their last meeting)> offered ZOLOFT 25-50mg  daily as a trial & they agree...  ~  Jul 14, 2011:  45mo ROV & Ariana [name] is here w/ her daughter who is her caregiver; there is much conflict w/ pt being argumentative & set in her ways, resisting help from daugh, etc; especially regarding her meds> daughter notes several meds caused reactions- staggering, hallucinations, confusion (eg-  Zoloft, Klonopin); pt takes Xanax as needed (admin by family) & she is really angry about not getting more pain medicine when she wants it; we discussed these issues but she has early dementia & doesn't "get-it", daugh requested Alz med & we will start Ariana [name] 5mg /d for 467mo the 10mg /d thereafter...  Pt spent an inordinate amt of time ranting about her teeth, dentures, implants, TMJ, her severe dry mouth, etc...    We reviewed prob list, meds, xrays and labs> see below>> LABS 5/13:  Chems- ok w/ BS=105 Na=132;  CBC- wnl;  Fe=123 (30%sat);   TSH=1.56; Urine- +UTI ==> we will Rx w/ Cipro250Bid...          Problem List:  SJOGREN'S SYNDROME (ICD-710.2), & DRY EYE SYNDROME (ICD-375.15) - eval by DrHecker & DrDeveshwar 5/11 w/ extensive lab work done & Dx of Sjogren's syndrome & started on Pilocarpine 5mg Bid... DrHecker has her on a number of eye drops, & prev thought to be dry eyes w/ ocular rosacea & tubes placed in tear ducts... also seen by Ariana [name]...  ~  10/11:  DrDeveshwar started PLAQUENIL 200mg /d... Seen every 67mo by Rheum. ~  Most recent note from Ariana [name] 4/13> Sjogren's, Raynaud's, LBP, musc spasms, on Plaquenil Rx & no changes made.  CHRONIC ORAL DISCOMFORT - this has been looked into by mult physicians and no other etiology discovered (other than her sicca syndrome)... she uses Magic Mouthwash Prn + sialogogues...  HYPERTENSION, BENIGN (ICD-401.1) - controlled on TOPROL XL 50mg - 1/2 tabBid, DILTIAZEM CD 180mg /d, & LASIX 20mg  Qod...   ~  5/12: BP= 134/80 today, takes meds regularly & tol well... denies CP, worsening palipit, syncope, dyspnea, edema, etc... ~  11/12: BP= 122/60 & she remains asymptomatic... ~  5/13:  BP= 122/70 & she denies CP, palpit, change in SOB, edema, etc...  MITRAL VALVE PROLAPSE (ICD-424.0) - on ASA 81mg /d, & LANOXIN 0.125mg /d and followed by DrLittle for Cardiology... his notes are reviewed> MVP, HBP, hx palpit, neg cath... ~  cath 1/05 by DrLittle showed clean coronaries & norm LVF w/ +MVP... ~  1/11:  Seen by DrLittle for f/u MVP & palpit; BP was sl elev & he rec incr Metoprolol to 25mg  Bid.  HYPERLIPIDEMIA>  On low chol, low fat diet alone... She takes Fish Oil, flax seed oil, etc. ~  2008:  FLP showed TChol 170, TG 176, HDL 50,  ~  5/12:  FLP showed TChol 142, TG 202, HDL 62, LDL 166  GERD (ICD-530.81) - on PREVACID 30mg /d... denies reflux symptoms, swallowing OK x for dryness, etc... ~  last EGD 9/02 showed 4cmHH, GERD...  DIVERTICULOSIS OF COLON (ICD-562.10),  IRRITABLE BOWEL  SYNDROME (ICD-564.1), & Family Hx of COLON CANCER (ICD-153.9) - followed for GI by DrSamLeBauer- now DrGessner- colonoscopy 2/06 showed a tortuous and spastic colon, divertics, otherw neg... her sister died in her 16's w/ colon cancer... f/u colonoscopy 4/10 showed mod divertics, hems, melanosis> Rx'd w/ Miralax/ Senakot-S. ~  11/11:  she notes incr IBS symptoms- rec Rx w/ BENTYL 20mg Qid Prn, Mylicon, Phazyme ==> improved.  URINARY INCONTINENCE (ICD-788.30) - eval by GYN= DrRNeal (on Premarin 0.9mg /d)...  LOW BACK PAIN SYNDROME (ICD-724.2) - on VICODIN up to 3/ day... Ortho= DrMortenson, Ariana [name], et al... ~  5/12:  She is c/o incr pain in right side of back/ hip/ leg> refer to Ortho for further eval; Rx Vicodin prn...  FIBROMYALGIA (ICD-729.1) - she has chr fatigue symptoms and diffuse aches & pains... she has been eval by Rheum DrDeveshwar  w/ recent dx of Sjogren's Syndrome & DDD- on CYCLOBENZAPRINE 10mg - 1/2-1 tab Prn...  MEMORY LOSS (ICD-780.93) - eval by DrHickling for Neurology w/ MRI done- pt states "it's not dementia, just hardening of the arteries"... his note from 4/08 indicates- OBS, gait abn & neck/ Back pain diagnoses... ~  5/13:  Daughter notes more difficulty & ready to start medication ==> ARICEPT 5mg /d x30d, then incr to 10mg /d...  NEUROPATHY (ICD-355.9) - on GABAPENTIN 100mg Tid w/ some relief; initially started on larger dose (300mg Bid) per DrHickling, but she was intol w/ confusion & slurred speech... neuropathy symptoms improved on Rx.  ANXIETY DEPRESSION (ICD-300.4) - she take ALPRAZOLAM 0.5mg Bid as needed... prev Psychiatric Rx from DrCottle but she hasn't seen him in >54yr & refuses to return... Family requested med for depression & we tried Zoloft 25 to 50mg  /d but they stopped it due to affect on her gait & speech... Note> Klonopin in past caused hallucinations, but she tolerates the intermittent Xanax ok...  Hx of ADVERSE DRUG REACTION (ICD-995.20) - hx of mult drug allergies  and intolerances including:  PCN, Sulfa, Doxy, NSAIDs, Demerol, Codeine, DCN100, Tramadol, Phenergan, Thorazine, Stelazine...  NOTE: she takes Vicodin for pain, and tolerates this satis...  Health Maintenance - GYN= DrNeal on Premarin, Calcium, Vits, & Fish Oil...   Past Surgical History  Procedure Date  . Vesicovaginal fistula closure w/ tah   . Appendectomy   . Cholecystectomy   . Mandible surgery   . Temporomandibular joint surgery   . Cataract extraction, bilateral     Outpatient Encounter Prescriptions as of 07/14/2011  Medication Sig Dispense Refill  . ALPRAZolam (XANAX) 0.5 MG tablet Take 0.5 mg by mouth 2 (two) times daily as needed. For anxiety      . aspirin 81 MG tablet Take 81 mg by mouth daily.        . Calcium Carbonate-Vitamin D (CALTRATE 600+D) 600-400 MG-UNIT per tablet Take 2 tablets by mouth daily.        . carboxymethylcellulose (REFRESH TEARS) 0.5 % SOLN 1 drop 2 (two) times daily.        . clonazePAM (KLONOPIN) 1 MG tablet Take 1 tablet (1 mg total) by mouth 2 (two) times daily.  60 tablet  0  . cyclobenzaprine (FLEXERIL) 10 MG tablet Take 1/2 to 1 tablet by mouth 4 times daily as needed for msucle spasm  120 tablet  5  . cycloSPORINE (RESTASIS) 0.05 % ophthalmic emulsion Place 1 drop into both eyes every 12 (twelve) hours.       Marland Kitchen dicyclomine (BENTYL) 20 MG tablet TAKE 1 TABLET EVERY 6 HOURS AS NEEDED FOR ABDOMINAL CRAMPING  50 tablet  10  . digoxin (LANOXIN) 0.125 MG tablet Take 125 mcg by mouth daily.        Marland Kitchen diltiazem (CARDIZEM CD) 180 MG 24 hr capsule Take 1 capsule (180 mg total) by mouth daily.  30 capsule  11  . Diphenhyd-Hydrocort-Nystatin SUSP 1 tsp gargle and swallow four times a day as needed  120 mL  5  . estrogens, conjugated, (PREMARIN) 0.9 MG tablet Take 0.9 mg by mouth daily. Take daily for 21 days then do not take for 7 days.       . furosemide (LASIX) 20 MG tablet Take one tablet by mouth every other day       . gabapentin (NEURONTIN) 100 MG  capsule Take 1 capsule (100 mg total) by mouth 3 (three) times daily.  90 capsule  5  . HYDROcodone-acetaminophen (  VICODIN) 5-500 MG per tablet Take 1 tablet by mouth three times daily as needed for pain--DO NOT EXCEED 3 PER DAY.  90 tablet  5  . hydroxychloroquine (PLAQUENIL) 200 MG tablet Take 200 mg by mouth daily. As directed by Dr. Corliss Skains       . lansoprazole (PREVACID) 30 MG capsule Take 1 capsule (30 mg total) by mouth daily.  30 capsule  11  . lansoprazole (PREVACID) 30 MG capsule TAKE ONE CAPSULE BY MOUTH EVERY DAY  30 capsule  3  . loteprednol (LOTEMAX) 0.5 % ophthalmic suspension Place 1 drop into both eyes 2 (two) times daily.      . metoprolol (LOPRESSOR) 50 MG tablet Take 25 mg by mouth 2 (two) times daily.      . Multiple Vitamin (MULTIVITAMIN) tablet Take 1 tablet by mouth daily.        . Omega-3 Fatty Acids (FISH OIL) 1000 MG CAPS Take 1 capsule by mouth daily.        . pilocarpine (SALAGEN) 5 MG tablet Take 5 mg by mouth 2 (two) times daily. As directed by Dr. Corliss Skains       . polyethylene glycol (MIRALAX / GLYCOLAX) packet Take 17 g by mouth daily.        . sennosides-docusate sodium (SENOKOT-S) 8.6-50 MG tablet 1-2 tabs by mouth at bedtime       . sertraline (ZOLOFT) 50 MG tablet Take 1/2 tablet daily for 3 weeks then one tablet daily thereafter  30 tablet  6    Allergies  Allergen Reactions  . Codeine Nausea Only    unless given with Phenergan  . Doxycycline   . Meperidine Hcl Nausea Only    unless given with Phenergan  . Naproxen   . Norflex (Orphenadrine Citrate) Nausea Only    Unless given with Phenergan  . Oxycodone-Acetaminophen Nausea Only    unless given with phenergan  . Penicillins   . Phenothiazines   . Propoxyphene Hcl Nausea Only    unless given with phenergan  . Stelazine   . Sulfamethoxazole W-Trimethoprim   . Tolectin (Tolmetin Sodium)   . Tramadol   . Lubiprostone Rash    Current Medications, Allergies, Past Ariana History, Past Surgical  History, Family History, and Social History were reviewed in [employer] record.    Review of Systems        See HPI - all other systems neg except as noted... The patient complains of decreased hearing, hoarseness, dyspnea on exertion, and abdominal pain.  The patient denies anorexia, fever, weight loss, weight gain, vision loss, chest pain, syncope, peripheral edema, prolonged cough, headaches, hemoptysis, melena, hematochezia, severe indigestion/heartburn, hematuria, incontinence, muscle weakness, suspicious skin lesions, transient blindness, difficulty walking, depression, unusual weight change, abnormal bleeding, enlarged lymph nodes, and angioedema.     Objective:   Physical Exam      WD, WN, 78 y/o WF in NAD... she has dry eyes & prominent dry mouth symptoms... GENERAL:  Alert & oriented; pleasant & cooperative...  HEENT:  [place]/AT, EOM-wnl, PERRLA, EACs-clear, TMs-wnl, NOSE-clear, THROAT-clear & excessively dry. NECK:  Supple w/ fairROM; no JVD; normal carotid impulses w/o bruits; no thyromegaly or nodules palpated; no lymphadenopathy. CHEST:  Clear to P & A; without wheezes/ rales/ or rhonchi. HEART:  Regular Rhythm; without murmurs/ rubs/ or gallops heard... ABDOMEN:  Soft & nontender; normal bowel sounds; no organomegaly or masses detected. EXT: without deformities, mild arthritic changes; no varicose veins/ venous insuffic/ or edema. NEURO:  CN's intact;  no focal neuro deficits... DERM:  No lesions noted; no rash etc...  RADIOLOGY DATA:  Reviewed in the EPIC EMR & discussed w/ the patient...  LABORATORY DATA:  Reviewed in the EPIC EMR & discussed w/ the patient...   Assessment & Plan:   UTI>  She is not convincingly symptomatic but UA w/ Bact, WBC, etc & we will treat empirically w/ Cipro 250mg Bid...   SJOGREN's>  Still w/ very dry Sicca syndrome & medication from DrDeveshwar, Ophthalmology, etc...  HBP>  Controlled on meds, continue same...  MVP>   Followed by DrLittle for Cards, & stable on meds above...  HYPERLIPID>  TG sl elev & rec to get on better low fat diet...  GI>  GERD, HH, Divertics, IBS, +FemHx colon ca>  Stable on Prev30, last colon 2010- OK, Bentyl helps...  Chronic Pain Syndrome>  She states adeq control w/ Vicodin Rx...  Memory loss/ anxiety/ depression>  Prev eval by Neuro- DrHickling, DrCottle for psyche;  Only on alpraz at present, & daught er is ready for ARICEPT- start 5mg /d for 30d then go up to 10mg /d...   Patient's Medications  New Prescriptions   DONEPEZIL (ARICEPT) 10 MG TABLET    Take 1/2 tablet by mouth daily x 1 month, then increase to 1 tablet by mouth daily.  Previous Medications   ALPRAZOLAM (XANAX) 0.5 MG TABLET    Take 0.5 mg by mouth 2 (two) times daily as needed. For anxiety   ASPIRIN 81 MG TABLET    Take 81 mg by mouth daily.     CALCIUM CARBONATE-VITAMIN D (CALTRATE 600+D) 600-400 MG-UNIT PER TABLET    Take 2 tablets by mouth daily.     CARBOXYMETHYLCELLULOSE (REFRESH TEARS) 0.5 % SOLN    1 drop 2 (two) times daily.     CYCLOBENZAPRINE (FLEXERIL) 10 MG TABLET    Take 1/2 to 1 tablet by mouth 4 times daily as needed for msucle spasm   CYCLOSPORINE (RESTASIS) 0.05 % OPHTHALMIC EMULSION    Place 1 drop into both eyes every 12 (twelve) hours.    DICYCLOMINE (BENTYL) 20 MG TABLET    TAKE 1 TABLET EVERY 6 HOURS AS NEEDED FOR ABDOMINAL CRAMPING   DIGOXIN (LANOXIN) 0.125 MG TABLET    Take 125 mcg by mouth daily.     DILTIAZEM (CARDIZEM CD) 180 MG 24 HR CAPSULE    Take 1 capsule (180 mg total) by mouth daily.   ESTROGENS, CONJUGATED, (PREMARIN) 0.9 MG TABLET    Take 0.9 mg by mouth daily. Take daily for 21 days then do not take for 7 days.    FUROSEMIDE (LASIX) 20 MG TABLET    Take one tablet by mouth every other day    GABAPENTIN (NEURONTIN) 100 MG CAPSULE    Take 1 capsule (100 mg total) by mouth 3 (three) times daily.   HYDROCODONE-ACETAMINOPHEN (VICODIN) 5-500 MG PER TABLET    Take 1 tablet by mouth  three times daily as needed for pain--DO NOT EXCEED 3 PER DAY.   HYDROXYCHLOROQUINE (PLAQUENIL) 200 MG TABLET    Take 200 mg by mouth daily. As directed by Dr. Corliss Skains    LANSOPRAZOLE (PREVACID) 30 MG CAPSULE    Take 1 capsule (30 mg total) by mouth daily.   LOTEPREDNOL (LOTEMAX) 0.5 % OPHTHALMIC SUSPENSION    Place 1 drop into both eyes 2 (two) times daily.   METOPROLOL (LOPRESSOR) 50 MG TABLET    Take 25 mg by mouth 2 (two) times daily.  MULTIPLE VITAMIN (MULTIVITAMIN) TABLET    Take 1 tablet by mouth daily.     OMEGA-3 FATTY ACIDS (FISH OIL) 1000 MG CAPS    Take 1 capsule by mouth daily.     PILOCARPINE (SALAGEN) 5 MG TABLET    Take 5 mg by mouth 2 (two) times daily. As directed by Dr. Corliss Skains    POLYETHYLENE GLYCOL [facility]) PACKET    Take 17 g by mouth daily.     SENNOSIDES-DOCUSATE SODIUM (SENOKOT-S) 8.6-50 MG TABLET    1-2 tabs by mouth at bedtime   Modified Medications   No medications on file  Discontinued Medications   CLONAZEPAM (KLONOPIN) 1 MG TABLET    Take 1 tablet (1 mg total) by mouth 2 (two) times daily.   DIPHENHYD-HYDROCORT-NYSTATIN SUSP    1 tsp gargle and swallow four times a day as needed   LANSOPRAZOLE (PREVACID) 30 MG CAPSULE    TAKE ONE CAPSULE BY MOUTH EVERY DAY   SERTRALINE (ZOLOFT) 50 MG TABLET    Take 1/2 tablet daily for 3 weeks then one tablet daily thereafter

## 2011-07-14 NOTE — Patient Instructions (Signed)
Today we updated your med list in our EPIC system...    Continue your current medications the same...  We decided to add ARICEPT (Donepizil) 10mg  tabs- start 1/2 tab daily for 41mo or so then increase to 1 tab daily thereafter...  Today we did your follow up blood work...    We will call you w/ the report when avail...  At your convenience- please check in w/ a podiatrist regarding your left great toenail...  Call for any questions...  Let's plan a routine follow up in 6 months, but call anytime if needed for problems.Marland KitchenMarland Kitchen

## 2011-07-18 ENCOUNTER — Emergency Department (HOSPITAL_COMMUNITY): Payer: Medicare Other

## 2011-07-18 ENCOUNTER — Encounter (HOSPITAL_COMMUNITY): Payer: Self-pay | Admitting: Cardiology

## 2011-07-18 ENCOUNTER — Telehealth: Payer: Self-pay | Admitting: Pulmonary Disease

## 2011-07-18 ENCOUNTER — Inpatient Hospital Stay (HOSPITAL_COMMUNITY)
Admission: EM | Admit: 2011-07-18 | Discharge: 2011-07-25 | DRG: 689 | Disposition: A | Discharge status: Skilled Nursing Facility | Payer: Medicare Other | Attending: Internal Medicine | Admitting: Internal Medicine

## 2011-07-18 ENCOUNTER — Inpatient Hospital Stay (HOSPITAL_COMMUNITY): Payer: Medicare Other

## 2011-07-18 DIAGNOSIS — H04129 Dry eye syndrome of unspecified lacrimal gland: Secondary | ICD-10-CM | POA: Diagnosis present

## 2011-07-18 DIAGNOSIS — E7800 Pure hypercholesterolemia, unspecified: Secondary | ICD-10-CM

## 2011-07-18 DIAGNOSIS — R51 Headache: Secondary | ICD-10-CM

## 2011-07-18 DIAGNOSIS — K589 Irritable bowel syndrome without diarrhea: Secondary | ICD-10-CM

## 2011-07-18 DIAGNOSIS — I059 Rheumatic mitral valve disease, unspecified: Secondary | ICD-10-CM | POA: Diagnosis present

## 2011-07-18 DIAGNOSIS — M545 Low back pain, unspecified: Secondary | ICD-10-CM | POA: Diagnosis present

## 2011-07-18 DIAGNOSIS — E871 Hypo-osmolality and hyponatremia: Secondary | ICD-10-CM

## 2011-07-18 DIAGNOSIS — N390 Urinary tract infection, site not specified: Principal | ICD-10-CM

## 2011-07-18 DIAGNOSIS — Z86010 Personal history of colonic polyps: Secondary | ICD-10-CM

## 2011-07-18 DIAGNOSIS — R413 Other amnesia: Secondary | ICD-10-CM

## 2011-07-18 DIAGNOSIS — G929 Unspecified toxic encephalopathy: Secondary | ICD-10-CM | POA: Diagnosis present

## 2011-07-18 DIAGNOSIS — R1310 Dysphagia, unspecified: Secondary | ICD-10-CM | POA: Diagnosis present

## 2011-07-18 DIAGNOSIS — M3500 Sicca syndrome, unspecified: Secondary | ICD-10-CM | POA: Diagnosis present

## 2011-07-18 DIAGNOSIS — I10 Essential (primary) hypertension: Secondary | ICD-10-CM | POA: Diagnosis present

## 2011-07-18 DIAGNOSIS — K5730 Diverticulosis of large intestine without perforation or abscess without bleeding: Secondary | ICD-10-CM

## 2011-07-18 DIAGNOSIS — K219 Gastro-esophageal reflux disease without esophagitis: Secondary | ICD-10-CM | POA: Diagnosis present

## 2011-07-18 DIAGNOSIS — T887XXA Unspecified adverse effect of drug or medicament, initial encounter: Secondary | ICD-10-CM

## 2011-07-18 DIAGNOSIS — G589 Mononeuropathy, unspecified: Secondary | ICD-10-CM

## 2011-07-18 DIAGNOSIS — G92 Toxic encephalopathy: Secondary | ICD-10-CM | POA: Diagnosis present

## 2011-07-18 DIAGNOSIS — R32 Unspecified urinary incontinence: Secondary | ICD-10-CM

## 2011-07-18 DIAGNOSIS — K1370 Unspecified lesions of oral mucosa: Secondary | ICD-10-CM | POA: Diagnosis present

## 2011-07-18 DIAGNOSIS — R339 Retention of urine, unspecified: Secondary | ICD-10-CM

## 2011-07-18 DIAGNOSIS — F341 Dysthymic disorder: Secondary | ICD-10-CM

## 2011-07-18 DIAGNOSIS — E876 Hypokalemia: Secondary | ICD-10-CM | POA: Diagnosis not present

## 2011-07-18 DIAGNOSIS — F0390 Unspecified dementia without behavioral disturbance: Secondary | ICD-10-CM

## 2011-07-18 DIAGNOSIS — F411 Generalized anxiety disorder: Secondary | ICD-10-CM | POA: Diagnosis present

## 2011-07-18 DIAGNOSIS — R410 Disorientation, unspecified: Secondary | ICD-10-CM

## 2011-07-18 DIAGNOSIS — E782 Mixed hyperlipidemia: Secondary | ICD-10-CM

## 2011-07-18 DIAGNOSIS — IMO0001 Reserved for inherently not codable concepts without codable children: Secondary | ICD-10-CM | POA: Diagnosis present

## 2011-07-18 HISTORY — DX: Depression, unspecified: F32.A

## 2011-07-18 HISTORY — DX: Personal history of urinary (tract) infections: Z87.440

## 2011-07-18 HISTORY — DX: Personal history of other diseases of the digestive system: Z87.19

## 2011-07-18 HISTORY — DX: Polyneuropathy, unspecified: G62.9

## 2011-07-18 HISTORY — DX: Major depressive disorder, single episode, unspecified: F32.9

## 2011-07-18 HISTORY — DX: Anxiety disorder, unspecified: F41.9

## 2011-07-18 LAB — RAPID URINE DRUG SCREEN, HOSP PERFORMED
Barbiturates: NOT DETECTED
Tetrahydrocannabinol: NOT DETECTED

## 2011-07-18 LAB — BASIC METABOLIC PANEL
CO2: 24 mEq/L (ref 19–32)
Calcium: 9.6 mg/dL (ref 8.4–10.5)
Chloride: 92 mEq/L — ABNORMAL LOW (ref 96–112)
Creatinine, Ser: 0.58 mg/dL (ref 0.50–1.10)
Glucose, Bld: 97 mg/dL (ref 70–99)
Sodium: 129 mEq/L — ABNORMAL LOW (ref 135–145)

## 2011-07-18 LAB — URINE MICROSCOPIC-ADD ON

## 2011-07-18 LAB — URINALYSIS, ROUTINE W REFLEX MICROSCOPIC
Glucose, UA: NEGATIVE mg/dL
Hgb urine dipstick: NEGATIVE
Protein, ur: NEGATIVE mg/dL
pH: 8 (ref 5.0–8.0)

## 2011-07-18 LAB — DIFFERENTIAL
Basophils Absolute: 0 10*3/uL (ref 0.0–0.1)
Eosinophils Relative: 0 % (ref 0–5)
Lymphocytes Relative: 19 % (ref 12–46)
Lymphs Abs: 1.3 10*3/uL (ref 0.7–4.0)
Monocytes Absolute: 0.5 10*3/uL (ref 0.1–1.0)
Neutro Abs: 5 10*3/uL (ref 1.7–7.7)

## 2011-07-18 LAB — CBC
HCT: 33.9 % — ABNORMAL LOW (ref 36.0–46.0)
MCV: 87.6 fL (ref 78.0–100.0)
RBC: 3.87 MIL/uL (ref 3.87–5.11)
WBC: 6.7 10*3/uL (ref 4.0–10.5)

## 2011-07-18 MED ORDER — CEFTRIAXONE SODIUM 1 G IJ SOLR
1.0000 g | Freq: Once | INTRAMUSCULAR | Status: AC
  Administered 2011-07-18: 1 g via INTRAVENOUS
  Filled 2011-07-18: qty 10

## 2011-07-18 MED ORDER — HYPROMELLOSE (GONIOSCOPIC) 2.5 % OP SOLN
1.0000 [drp] | Freq: Four times a day (QID) | OPHTHALMIC | Status: DC
  Administered 2011-07-19: 1 [drp] via OPHTHALMIC
  Filled 2011-07-18 (×3): qty 15

## 2011-07-18 MED ORDER — CARBOXYMETHYLCELLULOSE SODIUM 0.5 % OP SOLN: 1.0000 [drp] | Freq: Two times a day (BID) | OPHTHALMIC | Status: DC

## 2011-07-18 MED ORDER — SENNA-DOCUSATE SODIUM 8.6-50 MG PO TABS: 1.0000 | ORAL_TABLET | ORAL | Status: DC | PRN

## 2011-07-18 MED ORDER — LOTEPREDNOL ETABONATE 0.5 % OP SUSP
1.0000 [drp] | Freq: Two times a day (BID) | OPHTHALMIC | Status: DC
  Administered 2011-07-18 – 2011-07-25 (×14): 1 [drp] via OPHTHALMIC
  Filled 2011-07-18 (×2): qty 5

## 2011-07-18 MED ORDER — DIGOXIN 125 MCG PO TABS
125.0000 ug | ORAL_TABLET | Freq: Every day | ORAL | Status: DC
  Administered 2011-07-19 – 2011-07-25 (×7): 125 ug via ORAL
  Filled 2011-07-18 (×7): qty 1

## 2011-07-18 MED ORDER — IOHEXOL 300 MG/ML  SOLN
20.0000 mL | INTRAMUSCULAR | Status: DC
  Administered 2011-07-18: 20 mL via ORAL

## 2011-07-18 MED ORDER — PILOCARPINE HCL 5 MG PO TABS
5.0000 mg | ORAL_TABLET | Freq: Two times a day (BID) | ORAL | Status: DC
  Administered 2011-07-19 – 2011-07-25 (×13): 5 mg via ORAL
  Filled 2011-07-18 (×15): qty 1

## 2011-07-18 MED ORDER — METOPROLOL TARTRATE 25 MG PO TABS
25.0000 mg | ORAL_TABLET | Freq: Two times a day (BID) | ORAL | Status: DC
  Administered 2011-07-19 – 2011-07-25 (×13): 25 mg via ORAL
  Filled 2011-07-18 (×15): qty 1

## 2011-07-18 MED ORDER — ADULT MULTIVITAMIN W/MINERALS CH
1.0000 | ORAL_TABLET | Freq: Every day | ORAL | Status: DC
  Administered 2011-07-19 – 2011-07-25 (×7): 1 via ORAL
  Filled 2011-07-18 (×9): qty 1

## 2011-07-18 MED ORDER — DEXTROSE 5 % IV SOLN
1.0000 g | INTRAVENOUS | Status: DC
  Administered 2011-07-19 – 2011-07-23 (×5): 1 g via INTRAVENOUS
  Filled 2011-07-18 (×6): qty 10

## 2011-07-18 MED ORDER — CYCLOBENZAPRINE HCL 10 MG PO TABS
5.0000 mg | ORAL_TABLET | Freq: Three times a day (TID) | ORAL | Status: DC | PRN
  Administered 2011-07-19: 5 mg via ORAL
  Filled 2011-07-18: qty 2

## 2011-07-18 MED ORDER — SENNOSIDES-DOCUSATE SODIUM 8.6-50 MG PO TABS: 1.0000 | ORAL_TABLET | Freq: Every evening | ORAL | Status: DC | PRN

## 2011-07-18 MED ORDER — ALPRAZOLAM 0.5 MG PO TABS
0.5000 mg | ORAL_TABLET | Freq: Two times a day (BID) | ORAL | Status: DC | PRN
  Administered 2011-07-19 – 2011-07-21 (×3): 0.5 mg via ORAL
  Filled 2011-07-18 (×3): qty 1

## 2011-07-18 MED ORDER — FOLIC ACID 1 MG PO TABS
2.0000 mg | ORAL_TABLET | Freq: Every day | ORAL | Status: DC
  Administered 2011-07-19 – 2011-07-25 (×7): 2 mg via ORAL
  Filled 2011-07-18 (×7): qty 2

## 2011-07-18 MED ORDER — SODIUM CHLORIDE 0.9 % IV SOLN
INTRAVENOUS | Status: DC
  Administered 2011-07-18: 13:00:00 via INTRAVENOUS

## 2011-07-18 MED ORDER — SODIUM CHLORIDE 0.9 % IV SOLN
INTRAVENOUS | Status: DC
  Administered 2011-07-18: 21:00:00 via INTRAVENOUS

## 2011-07-18 MED ORDER — DONEPEZIL HCL 5 MG PO TABS
5.0000 mg | ORAL_TABLET | Freq: Every day | ORAL | Status: DC
  Administered 2011-07-19 – 2011-07-22 (×4): 5 mg via ORAL
  Filled 2011-07-18 (×6): qty 1

## 2011-07-18 MED ORDER — HYDROXYCHLOROQUINE SULFATE 200 MG PO TABS
200.0000 mg | ORAL_TABLET | Freq: Every day | ORAL | Status: DC
  Administered 2011-07-19 – 2011-07-25 (×7): 200 mg via ORAL
  Filled 2011-07-18 (×7): qty 1

## 2011-07-18 MED ORDER — IOHEXOL 300 MG/ML  SOLN
80.0000 mL | Freq: Once | INTRAMUSCULAR | Status: AC | PRN
  Administered 2011-07-18: 80 mL via INTRAVENOUS

## 2011-07-18 MED ORDER — HYDROCODONE-ACETAMINOPHEN 5-325 MG PO TABS
1.0000 | ORAL_TABLET | Freq: Four times a day (QID) | ORAL | Status: DC | PRN
  Administered 2011-07-19 – 2011-07-22 (×6): 1 via ORAL
  Filled 2011-07-18 (×6): qty 1

## 2011-07-18 MED ORDER — PANTOPRAZOLE SODIUM 40 MG PO TBEC
40.0000 mg | DELAYED_RELEASE_TABLET | Freq: Every day | ORAL | Status: DC
  Administered 2011-07-19 – 2011-07-25 (×7): 40 mg via ORAL
  Filled 2011-07-18 (×7): qty 1

## 2011-07-18 MED ORDER — SODIUM CHLORIDE 0.9 % IV SOLN
INTRAVENOUS | Status: DC
  Administered 2011-07-18 – 2011-07-19 (×2): via INTRAVENOUS
  Administered 2011-07-20: 20 mL/h via INTRAVENOUS
  Administered 2011-07-22: 20 mL via INTRAVENOUS

## 2011-07-18 MED ORDER — DILTIAZEM HCL ER COATED BEADS 180 MG PO CP24
180.0000 mg | ORAL_CAPSULE | Freq: Every day | ORAL | Status: DC
  Administered 2011-07-19 – 2011-07-25 (×7): 180 mg via ORAL
  Filled 2011-07-18 (×7): qty 1

## 2011-07-18 MED ORDER — POLYVINYL ALCOHOL 1.4 % OP SOLN
1.0000 [drp] | Freq: Two times a day (BID) | OPHTHALMIC | Status: DC
  Administered 2011-07-19: 1 [drp] via OPHTHALMIC
  Filled 2011-07-18: qty 15

## 2011-07-18 MED ORDER — GABAPENTIN 100 MG PO CAPS
100.0000 mg | ORAL_CAPSULE | Freq: Three times a day (TID) | ORAL | Status: DC
  Administered 2011-07-19: 100 mg via ORAL
  Filled 2011-07-18 (×4): qty 1

## 2011-07-18 MED ORDER — CYCLOSPORINE 0.05 % OP EMUL
1.0000 [drp] | Freq: Two times a day (BID) | OPHTHALMIC | Status: DC
  Administered 2011-07-19 – 2011-07-24 (×12): 1 [drp] via OPHTHALMIC
  Administered 2011-07-24: 8 [drp] via OPHTHALMIC
  Administered 2011-07-25: 1 [drp] via OPHTHALMIC
  Filled 2011-07-18 (×18): qty 1

## 2011-07-18 MED ORDER — ASPIRIN EC 81 MG PO TBEC
81.0000 mg | DELAYED_RELEASE_TABLET | Freq: Every day | ORAL | Status: DC
  Administered 2011-07-19 – 2011-07-25 (×7): 81 mg via ORAL
  Filled 2011-07-18 (×7): qty 1

## 2011-07-18 NOTE — H&P (Signed)
Ariana [name] is an 78 y.o. female.   PCP - Dr.Scott Lennon Alstrom. Chief Complaint: Pain. HPI: 78 year-old female with known history of Sjgren syndrome, MVP, memory loss, hypertension was taken to the PCP last Friday, 4 days ago for a routine checkup at that time patient was having increasing urinary frequency and a urinalysis was done. Today her PCPs office called her that they were calling in medications for UTI. At that time over the phone patient was continued abdominal pain and headache. Patient was instructed to go to the ER. In the ER patient had a CT abdomen pelvis which showed abdominal distention which is compatible with urine retention. Patient had a Foley placed and 600 cc of fluid drained. In addition patient has been complaining of headache for the last 4 days. Patient's daughter states she usually doesn't get headaches. Headache is mostly in the occipital area and she's pointing to the left side. Patient has no visual symptoms or any focal deficits. Has not had any difficulty speaking or swallowing. The patient's daughter states she at times gets difficulty swallowing. Denies any chest pain shortness of breath. In the ER in addition to urinary retention patient's UA shows features of UTI. CT of the abdomen and pelvis also shows features concerning for thickening of transverse colon. But patient's pain is mostly in the suprapubic area which at this time is improved after Foley catheter placement.  Past Medical History  Diagnosis Date  . Sjogren's syndrome   . Dry eye syndrome   . Hypertension, benign   . Mitral valve prolapse   . GERD (gastroesophageal reflux disease)   . Diverticulosis of colon   . Irritable bowel syndrome   . Urinary incontinence   . Low back pain syndrome   . Fibromyalgia   . Memory loss   . Neuropathy   . Anxiety and depression   . History of adverse drug reaction     Past Surgical History  Procedure Date  . Vesicovaginal fistula closure w/ tah   .  Appendectomy   . Cholecystectomy   . Mandible surgery   . Temporomandibular joint surgery   . Cataract extraction, bilateral     Family History  Problem Relation Age of Onset  . Colon cancer    . Heart disease Father   . Pneumonia Mother   . Heart attack Mother   . Cancer Sister   . Cancer Brother    Social History:  reports that she has quit smoking. She does not have any smokeless tobacco history on file. She reports that she does not drink alcohol. Her drug history not on file.  Allergies:  Allergies  Allergen Reactions  . Codeine Nausea Only    unless given with Phenergan  . Doxycycline     Unknown  . Klonopin (Clonazepam)     Causes hallucination   . Meperidine Hcl Nausea Only    unless given with Phenergan  . Naproxen   . Norflex (Orphenadrine Citrate) Nausea Only    Unless given with Phenergan  . Oxycodone-Acetaminophen Nausea Only    unless given with phenergan  . Penicillins     Unknown  . Phenothiazines     Unknown  . Propoxyphene Hcl Nausea Only    unless given with phenergan  . Stelazine     Unknown  . Sulfamethoxazole W-Trimethoprim     Unknown  . Tolectin (Tolmetin Sodium)     Unknown  . Tramadol     Unknown  . Zoloft (Sertraline Hcl)  Caused pt to sleep a lot  . Lubiprostone Rash    Medications Prior to Admission  Medication Sig Dispense Refill  . ALPRAZolam (XANAX) 0.5 MG tablet Take 0.5 mg by mouth 2 (two) times daily as needed. For anxiety      . aspirin 81 MG tablet Take 81 mg by mouth daily.        . Calcium Carbonate-Vitamin D (CALTRATE 600+D) 600-400 MG-UNIT per tablet Take 2 tablets by mouth daily.        . carboxymethylcellulose (REFRESH TEARS) 0.5 % SOLN 1 drop 2 (two) times daily.        . cyclobenzaprine (FLEXERIL) 10 MG tablet Take 1/2 to 1 tablet by mouth 4 times daily as needed for msucle spasm  120 tablet  5  . cycloSPORINE (RESTASIS) 0.05 % ophthalmic emulsion Place 1 drop into both eyes every 12 (twelve) hours.       Marland Kitchen  dicyclomine (BENTYL) 20 MG tablet TAKE 1 TABLET EVERY 6 HOURS AS NEEDED FOR ABDOMINAL CRAMPING  50 tablet  10  . digoxin (LANOXIN) 0.125 MG tablet Take 125 mcg by mouth daily.        Marland Kitchen diltiazem (CARDIZEM CD) 180 MG 24 hr capsule Take 1 capsule (180 mg total) by mouth daily.  30 capsule  11  . donepezil (ARICEPT) 10 MG tablet Take 1/2 tablet by mouth daily x 1 month, then increase to 1 tablet by mouth daily.  30 tablet  11  . folic acid (FOLVITE) 1 MG tablet Take 2 mg by mouth daily.      Marland Kitchen gabapentin (NEURONTIN) 100 MG capsule Take 1 capsule (100 mg total) by mouth 3 (three) times daily.  90 capsule  5  . HYDROcodone-acetaminophen (VICODIN) 5-500 MG per tablet Take 1 tablet by mouth three times daily as needed for pain--DO NOT EXCEED 3 PER DAY.  90 tablet  5  . hydroxychloroquine (PLAQUENIL) 200 MG tablet Take 200 mg by mouth daily. As directed by Dr. Corliss Skains       . hydroxypropyl methylcellulose (ISOPTO TEARS) 2.5 % ophthalmic solution Place 1 drop into both eyes 4 (four) times daily.      . lansoprazole (PREVACID) 30 MG capsule Take 1 capsule (30 mg total) by mouth daily.  30 capsule  11  . loteprednol (LOTEMAX) 0.5 % ophthalmic suspension Place 1 drop into both eyes 2 (two) times daily.      . metoprolol (LOPRESSOR) 50 MG tablet Take 25 mg by mouth 2 (two) times daily.      . Multiple Vitamin (MULTIVITAMIN) tablet Take 1 tablet by mouth daily.        . Omega-3 Fatty Acids (FISH OIL) 1000 MG CAPS Take 1 capsule by mouth daily.        . pilocarpine (SALAGEN) 5 MG tablet Take 5 mg by mouth 2 (two) times daily. As directed by Dr. Corliss Skains       . polyethylene glycol (MIRALAX / GLYCOLAX) packet Take 17 g by mouth daily.        . sennosides-docusate sodium (SENOKOT-S) 8.6-50 MG tablet 1-2 tabs by mouth at bedtime       . estrogens, conjugated, (PREMARIN) 0.9 MG tablet Take 0.9 mg by mouth daily. Take daily for 21 days then do not take for 7 days.       . furosemide (LASIX) 20 MG tablet Take one  tablet by mouth every other day         Results for orders placed during  the hospital encounter of 07/18/11 (from the past 48 hour(s))  CBC     Status: Abnormal   Collection Time   07/18/11 10:33 AM      Component Value Range Comment   WBC 6.7  4.0 - 10.5 (K/uL)    RBC 3.87  3.87 - 5.11 (MIL/uL)    Hemoglobin 11.9 (*) 12.0 - 15.0 (g/dL)    HCT 16.1 (*) 09.6 - 46.0 (%)    MCV 87.6  78.0 - 100.0 (fL)    MCH 30.7  26.0 - 34.0 (pg)    MCHC 35.1  30.0 - 36.0 (g/dL)    RDW 04.5  40.9 - 81.1 (%)    Platelets 232  150 - 400 (K/uL)   DIFFERENTIAL     Status: Normal   Collection Time   07/18/11 10:33 AM      Component Value Range Comment   Neutrophils Relative 74  43 - 77 (%)    Neutro Abs 5.0  1.7 - 7.7 (K/uL)    Lymphocytes Relative 19  12 - 46 (%)    Lymphs Abs 1.3  0.7 - 4.0 (K/uL)    Monocytes Relative 7  3 - 12 (%)    Monocytes Absolute 0.5  0.1 - 1.0 (K/uL)    Eosinophils Relative 0  0 - 5 (%)    Eosinophils Absolute 0.0  0.0 - 0.7 (K/uL)    Basophils Relative 0  0 - 1 (%)    Basophils Absolute 0.0  0.0 - 0.1 (K/uL)   BASIC METABOLIC PANEL     Status: Abnormal   Collection Time   07/18/11 10:33 AM      Component Value Range Comment   Sodium 129 (*) 135 - 145 (mEq/L)    Potassium 3.9  3.5 - 5.1 (mEq/L)    Chloride 92 (*) 96 - 112 (mEq/L)    CO2 24  19 - 32 (mEq/L)    Glucose, Bld 97  70 - 99 (mg/dL)    BUN 10  6 - 23 (mg/dL)    Creatinine, Ser 9.14  0.50 - 1.10 (mg/dL)    Calcium 9.6  8.4 - 10.5 (mg/dL)    GFR calc non Af Amer 85 (*) >90 (mL/min)    GFR calc Af Amer >90  >90 (mL/min)   URINALYSIS, ROUTINE W REFLEX MICROSCOPIC     Status: Abnormal   Collection Time   07/18/11 11:19 AM      Component Value Range Comment   Color, Urine YELLOW  YELLOW     APPearance CLEAR  CLEAR     Specific Gravity, Urine <1.005 (*) 1.005 - 1.030     pH 8.0  5.0 - 8.0     Glucose, UA NEGATIVE  NEGATIVE (mg/dL)    Hgb urine dipstick NEGATIVE  NEGATIVE     Bilirubin Urine NEGATIVE  NEGATIVE      Ketones, ur NEGATIVE  NEGATIVE (mg/dL)    Protein, ur NEGATIVE  NEGATIVE (mg/dL)    Urobilinogen, UA 0.2  0.0 - 1.0 (mg/dL)    Nitrite POSITIVE (*) NEGATIVE     Leukocytes, UA MODERATE (*) NEGATIVE    URINE MICROSCOPIC-ADD ON     Status: Abnormal   Collection Time   07/18/11 11:19 AM      Component Value Range Comment   Squamous Epithelial / LPF FEW (*) RARE     WBC, UA 11-20  <3 (WBC/hpf)    RBC / HPF 0-2  <3 (RBC/hpf)    Bacteria,  UA MANY (*) RARE     Urine-Other LESS THAN 10 mL OF URINE SUBMITTED   MICROSCOPIC EXAM PERFORMED ON UNCONCENTRATED URINE  DIGOXIN LEVEL     Status: Abnormal   Collection Time   07/18/11  8:08 PM      Component Value Range Comment   Digoxin Level 0.3 (*) 0.8 - 2.0 (ng/mL)   URINE RAPID DRUG SCREEN (HOSP PERFORMED)     Status: Normal   Collection Time   07/18/11  8:51 PM      Component Value Range Comment   Opiates NONE DETECTED  NONE DETECTED     Cocaine NONE DETECTED  NONE DETECTED     Benzodiazepines NONE DETECTED  NONE DETECTED     Amphetamines NONE DETECTED  NONE DETECTED     Tetrahydrocannabinol NONE DETECTED  NONE DETECTED     Barbiturates NONE DETECTED  NONE DETECTED     Ct Head Wo Contrast  07/18/2011  *RADIOLOGY REPORT*  Clinical Data: Headaches and altered level of consciousness.  CT HEAD WITHOUT CONTRAST  Technique:  Contiguous axial images were obtained from the base of the skull through the vertex without contrast.  Comparison: Head CT scan 02/11/2008.  Findings: There is cortical atrophy.  No evidence of acute abnormality including infarction, hemorrhage, mass lesion, mass effect, midline shift or abnormal extra-axial fluid collection. Calvarium intact.  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  IMPRESSION: No acute finding.  Atrophy noted.  Original Report Authenticated By: Bernadene Bell. Maricela Curet, M.D.   Ct Abdomen Pelvis W Contrast  07/18/2011  *RADIOLOGY REPORT*  Clinical Data: 78 year-old with severe right lower  quadrant pain and bloating.  CT ABDOMEN AND PELVIS WITH CONTRAST  Technique:  Multidetector CT imaging of the abdomen and pelvis was performed following the standard protocol during bolus administration of intravenous contrast.  Contrast: 80mL OMNIPAQUE IOHEXOL 300 MG/ML  SOLN  Comparison: None.  Findings: The lung bases are clear.  There is no evidence for free air.  The urinary bladder is markedly distended and could be the source of the patient's lower abdominal pain.  Uterus has been removed. There is a large amount stool in the distal colon and rectum. There is focal wall narrowing in the left transverse colon, best seen on sequence 2, image 52.  There is also focal wall thickening in the transverse colon on image 50.  The appendix is not confidently identified.  There is no significant free fluid or lymphadenopathy in the abdomen or pelvis.  There is mild fullness of the ureters bilaterally and most likely related to the urinary bladder distention.  There is a 0.9 cm low density exophytic structure in the left kidney which is suggestive for a cyst.  There appears to be additional cortical cysts in the left kidney.  There is fullness of the central intrahepatic bile ducts and the common bile duct measures up to 0.9 cm.  The biliary findings are most likely associated with the cholecystectomy. Portal venous system is patent.  No gross abnormality to the spleen, adrenal and pancreas.  Inadvertently, the entire chest was imaged during the delayed renal images.  There is enlargement of the left thyroid lobe and likely related to a nodule.  This nodule could measures up to 2.7 cm. There is no significant pericardial or pleural fluid.  Ascending thoracic aorta is slightly prominent measuring up to 3.4 cm.  There is no significant chest lymphadenopathy.  The trachea and mainstem bronchi are patent.  The  lungs are clear.  No evidence for airspace disease.  There may be scarring near the base the lingula.  Disc space  loss at L5-S1.  IMPRESSION: There is marked distention of the urinary bladder and could represent urinary retention.  Two segments of wall thickening in the transverse colon.  Findings could be associated with peristalsis or inflammation but neoplasm cannot be excluded.  Recommend correlation with prior colonoscopy or further evaluation with colonoscopy.  Evidence for colonic diverticulosis.  Large amount of stool in the distal colon and rectum.  Enlargement of the left thyroid lobe.  Recommend further characterization with ultrasound.  Original Report Authenticated By: Richarda Overlie, M.D.   Dg Chest Port 1 View  07/18/2011  *RADIOLOGY REPORT*  Clinical Data: Shortness of breath, abdominal pain/discomfort, evaluate for pneumonia  PORTABLE CHEST - 1 VIEW  Comparison: 04/08/2011; 01/09/2008  Findings: Grossly unchanged cardiac silhouette and mediastinal contours.  The lungs remain hyperinflated with grossly unchanged linear heterogeneous opacities within the left lower lung, favored to represent subsegmental atelectasis or scar.  There is grossly unchanged mild diffuse thickening of the central airways.  No focal airspace opacities.  No definite pleural effusion or pneumothorax. Unchanged bones.  IMPRESSION: Stable findings of emphysema/bronchitic change without acute cardiopulmonary disease.  Original Report Authenticated By: Waynard Reeds, M.D.    Review of Systems  Constitutional: Negative.   Eyes: Negative.   Respiratory: Negative.   Cardiovascular: Negative.   Gastrointestinal: Positive for abdominal pain.  Genitourinary: Negative.   Musculoskeletal: Negative.   Skin: Negative.   Neurological: Positive for headaches.  Endo/Heme/Allergies: Negative.   Psychiatric/Behavioral: Negative.     Blood pressure 128/77, pulse 96, temperature 98.2 F (36.8 C), temperature source Oral, resp. rate 18, height 5\' 3"  (1.6 m), weight 54.704 kg (120 lb 9.6 oz), SpO2 99.00%. Physical Exam  Constitutional: She is  oriented to person, place, and time. She appears well-developed and well-nourished. No distress.  HENT:  Head: Normocephalic and atraumatic.  Right Ear: External ear normal.  Left Ear: External ear normal.  Mouth/Throat: Oropharynx is clear and moist. No oropharyngeal exudate.  Eyes: Conjunctivae are normal. Pupils are equal, round, and reactive to light. Right eye exhibits no discharge. Left eye exhibits no discharge. No scleral icterus.  Neck: Normal range of motion. Neck supple.  Cardiovascular: Normal rate and regular rhythm.   Respiratory: Effort normal and breath sounds normal. No respiratory distress. She has no wheezes. She has no rales.  GI: Soft. Bowel sounds are normal. She exhibits no distension. There is no tenderness. There is no rebound.  Musculoskeletal: Normal range of motion. She exhibits no edema and no tenderness.  Neurological: She is alert and oriented to person, place, and time.       Moves all extremities.  Skin: Skin is warm and dry. She is not diaphoretic.     Assessment/Plan #1. UTI with urine retention - check urine cultures. Patient has been started on ceftriaxone which we will continue. Patient does have previous history of vesicovaginal fistula which was surgically corrected. Patient at this time is being placed on Foley catheter which we will try to discontinue and see voiding trials. Patient's daughter also states that she has history of interstitial cystitis and follows with urologist. #2. Headache - patient complains of occipital headache. CT head is negative. We will check sedimentation rate. If sedimentation rate is high we may have to think about temporal arteritis in the differential diagnosis. For now and keep the patient on neurochecks. #3. Hyponatremia -  probably from dehydration. Gently hydrate and recheck metabolic panel in a.m. #4. Thickening of the transverse colon per CT scan - check lactic acid. Patient has no diarrhea and is not complaining of any  pain in the epigastric area. Patient will need GI workup probably as outpatient if clinical course during inpatient is unremarkable. #5. Enlargement of the left thyroid per CT scan - check TSH free T3 and free T4. Will need sonogram of the thyroid for further workup as outpatient. #6. History of MVP and hypertension - continue present medications. Check digoxin level. #7. History of dementia - continue present medications.  CODE STATUS - full code.  Eduard Clos 07/18/2011, 10:58 PM

## 2011-07-18 NOTE — ED Notes (Signed)
Keerthi is the patient's daughter, she would like to be called [phone]

## 2011-07-18 NOTE — H&P (Signed)
Attempted to obtain Admission History assessment; pt became increasingly anxious and short of breath; rambling unsolicited information; attempted to calm without success; pt's RN Lowella Bandy called to bedside. Kyra Searles C 07/18/11 @ 2338

## 2011-07-18 NOTE — ED Notes (Signed)
Admitting MD at bedside.

## 2011-07-18 NOTE — ED Provider Notes (Signed)
4:25 PM Patient to move to CDU for placement of foley catheter draining of bladder.  Pt with RLQ pain and UTI, distended bladder.  Sign out received from Dr Weldon Inches.  Plan is for foley catheter placement, if patient is feeling better and feels well enough she may be d/c home with abx and foley.  If not, she may need admission to the hospital for UTI and urinary retention.    Nurse estimates between I&O catheter and foley, pt had about 650cc urine retained.    7:30 PM I spoke with patient's daughter who states patient has been more confused and agitated over the past week.  Patient currently very anxious appearing and complaining of headache and leg pain from neuropathy.  She appears confused.  Daughter agrees with admission.  Pt admitted to [facility] 2.  I spoke with Dr Toniann Fail who requested head CT.  I have placed head CT orders, bed request, and temporary admission orders.     Results for orders placed during the hospital encounter of 07/18/11  CBC      Component Value Range   WBC 6.7  4.0 - 10.5 (K/uL)   RBC 3.87  3.87 - 5.11 (MIL/uL)   Hemoglobin 11.9 (*) 12.0 - 15.0 (g/dL)   HCT 14.7 (*) 82.9 - 46.0 (%)   MCV 87.6  78.0 - 100.0 (fL)   MCH 30.7  26.0 - 34.0 (pg)   MCHC 35.1  30.0 - 36.0 (g/dL)   RDW 56.2  13.0 - 86.5 (%)   Platelets 232  150 - 400 (K/uL)  DIFFERENTIAL      Component Value Range   Neutrophils Relative 74  43 - 77 (%)   Neutro Abs 5.0  1.7 - 7.7 (K/uL)   Lymphocytes Relative 19  12 - 46 (%)   Lymphs Abs 1.3  0.7 - 4.0 (K/uL)   Monocytes Relative 7  3 - 12 (%)   Monocytes Absolute 0.5  0.1 - 1.0 (K/uL)   Eosinophils Relative 0  0 - 5 (%)   Eosinophils Absolute 0.0  0.0 - 0.7 (K/uL)   Basophils Relative 0  0 - 1 (%)   Basophils Absolute 0.0  0.0 - 0.1 (K/uL)  BASIC METABOLIC PANEL      Component Value Range   Sodium 129 (*) 135 - 145 (mEq/L)   Potassium 3.9  3.5 - 5.1 (mEq/L)   Chloride 92 (*) 96 - 112 (mEq/L)   CO2 24  19 - 32 (mEq/L)   Glucose, Bld 97   70 - 99 (mg/dL)   BUN 10  6 - 23 (mg/dL)   Creatinine, Ser 7.84  0.50 - 1.10 (mg/dL)   Calcium 9.6  8.4 - 69.6 (mg/dL)   GFR calc non Af Amer 85 (*) >90 (mL/min)   GFR calc Af Amer >90  >90 (mL/min)  URINALYSIS, ROUTINE W REFLEX MICROSCOPIC      Component Value Range   Color, Urine YELLOW  YELLOW    APPearance CLEAR  CLEAR    Specific Gravity, Urine <1.005 (*) 1.005 - 1.030    pH 8.0  5.0 - 8.0    Glucose, UA NEGATIVE  NEGATIVE (mg/dL)   Hgb urine dipstick NEGATIVE  NEGATIVE    Bilirubin Urine NEGATIVE  NEGATIVE    Ketones, ur NEGATIVE  NEGATIVE (mg/dL)   Protein, ur NEGATIVE  NEGATIVE (mg/dL)   Urobilinogen, UA 0.2  0.0 - 1.0 (mg/dL)   Nitrite POSITIVE (*) NEGATIVE    Leukocytes, UA MODERATE (*)  NEGATIVE   URINE MICROSCOPIC-ADD ON      Component Value Range   Squamous Epithelial / LPF FEW (*) RARE    WBC, UA 11-20  <3 (WBC/hpf)   RBC / HPF 0-2  <3 (RBC/hpf)   Bacteria, UA MANY (*) RARE    Urine-Other LESS THAN 10 mL OF URINE SUBMITTED     Ct Abdomen Pelvis W Contrast  07/18/2011  *RADIOLOGY REPORT*  Clinical Data: 78 year-old with severe right lower quadrant pain and bloating.  CT ABDOMEN AND PELVIS WITH CONTRAST  Technique:  Multidetector CT imaging of the abdomen and pelvis was performed following the standard protocol during bolus administration of intravenous contrast.  Contrast: 80mL OMNIPAQUE IOHEXOL 300 MG/ML  SOLN  Comparison: None.  Findings: The lung bases are clear.  There is no evidence for free air.  The urinary bladder is markedly distended and could be the source of the patient's lower abdominal pain.  Uterus has been removed. There is a large amount stool in the distal colon and rectum. There is focal wall narrowing in the left transverse colon, best seen on sequence 2, image 52.  There is also focal wall thickening in the transverse colon on image 50.  The appendix is not confidently identified.  There is no significant free fluid or lymphadenopathy in the abdomen or  pelvis.  There is mild fullness of the ureters bilaterally and most likely related to the urinary bladder distention.  There is a 0.9 cm low density exophytic structure in the left kidney which is suggestive for a cyst.  There appears to be additional cortical cysts in the left kidney.  There is fullness of the central intrahepatic bile ducts and the common bile duct measures up to 0.9 cm.  The biliary findings are most likely associated with the cholecystectomy. Portal venous system is patent.  No gross abnormality to the spleen, adrenal and pancreas.  Inadvertently, the entire chest was imaged during the delayed renal images.  There is enlargement of the left thyroid lobe and likely related to a nodule.  This nodule could measures up to 2.7 cm. There is no significant pericardial or pleural fluid.  Ascending thoracic aorta is slightly prominent measuring up to 3.4 cm.  There is no significant chest lymphadenopathy.  The trachea and mainstem bronchi are patent.  The lungs are clear.  No evidence for airspace disease.  There may be scarring near the base the lingula.  Disc space loss at L5-S1.  IMPRESSION: There is marked distention of the urinary bladder and could represent urinary retention.  Two segments of wall thickening in the transverse colon.  Findings could be associated with peristalsis or inflammation but neoplasm cannot be excluded.  Recommend correlation with prior colonoscopy or further evaluation with colonoscopy.  Evidence for colonic diverticulosis.  Large amount of stool in the distal colon and rectum.  Enlargement of the left thyroid lobe.  Recommend further characterization with ultrasound.  Original Report Authenticated By: Richarda Overlie, M.D.   Dg Chest Port 1 View  07/18/2011  *RADIOLOGY REPORT*  Clinical Data: Shortness of breath, abdominal pain/discomfort, evaluate for pneumonia  PORTABLE CHEST - 1 VIEW  Comparison: 04/08/2011; 01/09/2008  Findings: Grossly unchanged cardiac silhouette and  mediastinal contours.  The lungs remain hyperinflated with grossly unchanged linear heterogeneous opacities within the left lower lung, favored to represent subsegmental atelectasis or scar.  There is grossly unchanged mild diffuse thickening of the central airways.  No focal airspace opacities.  No definite pleural effusion  or pneumothorax. Unchanged bones.  IMPRESSION: Stable findings of emphysema/bronchitic change without acute cardiopulmonary disease.  Original Report Authenticated By: Waynard Reeds, M.D.      Dillard Cannon [place], [place] 07/18/11 1932

## 2011-07-18 NOTE — ED Notes (Signed)
Attempted to phone floor report. Receiving RN unable to take report at this time. Left number for RN to call back.

## 2011-07-18 NOTE — ED Notes (Signed)
Pt to department via EMS from home- pt reports that the last thing that she remembers is 1600 yesterday. Pt found at the table with multiple pills in one bottle but very aggitated. Pt reports that she has a headache in the top of her head. Bp-120/90 HR-90 CBG-112

## 2011-07-18 NOTE — ED Notes (Signed)
Pt from home- EMS reports that they found pt sitting at kitchen table on arrival. Pt reports that she woke up on the floor and last memories were of yesterday afternoon around 1600. EMS reports that on arrival pt was very agitated and upset. Also pt had a bottle of pills that were mixed together, but no label on the bottle. Pt calmer on arrival, still has pressured speech. PEERLA intact. Skin warm and dry.

## 2011-07-18 NOTE — ED Provider Notes (Signed)
History     CSN: [id number]  Arrival date & time 07/18/11  1013   First MD Initiated Contact with Patient 07/18/11 1053      Chief Complaint  Patient presents with  . Altered Mental Status    (Consider location/radiation/quality/duration/timing/severity/associated sxs/prior treatment) Patient is a 78 y.o. female presenting with altered mental status. The history is provided by the patient.  Altered Mental Status Associated symptoms include abdominal pain. Pertinent negatives include no chest pain, no headaches and no shortness of breath.  The pt was sent to the ED for eval of "AMS."  THere is no record that she has dementia. She is alert and oriented now.  She c/o right lower abd pain and bilat leg pain which she attributes to her neuropathy.  She denies fever, chills, cough, sob, uti sxs.  She has intermittent diarrhea and constipation.  She has had a cholecystectomy and hysterectomy.    Past Medical History  Diagnosis Date  . Sjogren's syndrome   . Dry eye syndrome   . Hypertension, benign   . Mitral valve prolapse   . GERD (gastroesophageal reflux disease)   . Diverticulosis of colon   . Irritable bowel syndrome   . Urinary incontinence   . Low back pain syndrome   . Fibromyalgia   . Memory loss   . Neuropathy   . Anxiety and depression   . History of adverse drug reaction     Past Surgical History  Procedure Date  . Vesicovaginal fistula closure w/ tah   . Appendectomy   . Cholecystectomy   . Mandible surgery   . Temporomandibular joint surgery   . Cataract extraction, bilateral     Family History  Problem Relation Age of Onset  . Colon cancer    . Heart disease Father   . Pneumonia Mother   . Heart attack Mother   . Cancer Sister   . Cancer Brother     History  Substance Use Topics  . Smoking status: Former Games developer  . Smokeless tobacco: Not on file  . Alcohol Use: No    OB History    Grav Para Term Preterm Abortions TAB SAB Ect Mult Living                   Review of Systems  Constitutional: Negative for fever and chills.  Respiratory: Negative for cough and shortness of breath.   Cardiovascular: Negative for chest pain.  Gastrointestinal: Positive for abdominal pain, diarrhea and constipation. Negative for nausea and vomiting.  Genitourinary: Negative for dysuria.  Musculoskeletal: Negative for back pain.  Neurological: Negative for headaches.  Psychiatric/Behavioral: Positive for altered mental status. Negative for confusion.  All other systems reviewed and are negative.    Allergies  Codeine; Doxycycline; Klonopin; Meperidine hcl; Naproxen; Norflex; Oxycodone-acetaminophen; Penicillins; Phenothiazines; Propoxyphene hcl; Stelazine; Sulfamethoxazole w-trimethoprim; Tolectin; Tramadol; Zoloft; and Lubiprostone  Home Medications   Current Outpatient Rx  Name Route Sig Dispense Refill  . CYCLOSPORINE 0.05 % OP EMUL Both Eyes Place 1 drop into both eyes every 12 (twelve) hours.     Marland Kitchen HYPROMELLOSE 2.5 % OP SOLN Both Eyes Place 1 drop into both eyes 4 (four) times daily.    Marland Kitchen LOTEPREDNOL ETABONATE 0.5 % OP SUSP Both Eyes Place 1 drop into both eyes 2 (two) times daily.    Marland Kitchen ALPRAZOLAM 0.5 MG PO TABS Oral Take 0.5 mg by mouth 2 (two) times daily as needed. For anxiety    . ASPIRIN 81 MG PO  TABS Oral Take 81 mg by mouth daily.      Marland Kitchen CALCIUM CARBONATE-VITAMIN D 600-400 MG-UNIT PO TABS Oral Take 2 tablets by mouth daily.      Marland Kitchen CARBOXYMETHYLCELLULOSE SODIUM 0.5 % OP SOLN  1 drop 2 (two) times daily.      . CYCLOBENZAPRINE HCL 10 MG PO TABS  Take 1/2 to 1 tablet by mouth 4 times daily as needed for msucle spasm 120 tablet 5  . DICYCLOMINE HCL 20 MG PO TABS  TAKE 1 TABLET EVERY 6 HOURS AS NEEDED FOR ABDOMINAL CRAMPING 50 tablet 10  . DIGOXIN 0.125 MG PO TABS Oral Take 125 mcg by mouth daily.      Marland Kitchen DILTIAZEM HCL ER COATED BEADS 180 MG PO CP24 Oral Take 1 capsule (180 mg total) by mouth daily. 30 capsule 11  . DONEPEZIL HCL 10 MG PO  TABS  Take 1/2 tablet by mouth daily x 1 month, then increase to 1 tablet by mouth daily. 30 tablet 11  . ESTROGENS CONJUGATED 0.9 MG PO TABS Oral Take 0.9 mg by mouth daily. Take daily for 21 days then do not take for 7 days.     . FUROSEMIDE 20 MG PO TABS  Take one tablet by mouth every other day     . GABAPENTIN 100 MG PO CAPS Oral Take 1 capsule (100 mg total) by mouth 3 (three) times daily. 90 capsule 5  . HYDROCODONE-ACETAMINOPHEN 5-500 MG PO TABS  Take 1 tablet by mouth three times daily as needed for pain--DO NOT EXCEED 3 PER DAY. 90 tablet 5  . HYDROXYCHLOROQUINE SULFATE 200 MG PO TABS Oral Take 200 mg by mouth daily. As directed by Dr. Corliss Skains     . LANSOPRAZOLE 30 MG PO CPDR Oral Take 1 capsule (30 mg total) by mouth daily. 30 capsule 11  . METOPROLOL TARTRATE 50 MG PO TABS Oral Take 25 mg by mouth 2 (two) times daily.    Marland Kitchen ONE-DAILY MULTI VITAMINS PO TABS Oral Take 1 tablet by mouth daily.      Marland Kitchen FISH OIL 1000 MG PO CAPS Oral Take 1 capsule by mouth daily.      Marland Kitchen PILOCARPINE HCL 5 MG PO TABS Oral Take 5 mg by mouth 2 (two) times daily. As directed by Dr. Corliss Skains     . POLYETHYLENE GLYCOL 3350 PO PACK Oral Take 17 g by mouth daily.      . SENNA-DOCUSATE SODIUM 8.6-50 MG PO TABS  1-2 tabs by mouth at bedtime       BP 125/86  Pulse 88  Temp(Src) 98.1 F (36.7 C) (Oral)  Resp 18  SpO2 100%  Physical Exam  Nursing note and vitals reviewed. Constitutional: She is oriented to person, place, and time. No distress.       Frail elderly female, in no distress.  Rubbing right lower quadrant  HENT:  Head: Normocephalic and atraumatic.  Eyes: Conjunctivae and EOM are normal.  Neck: Normal range of motion.  Cardiovascular: Normal rate.   No murmur heard. Pulmonary/Chest: Effort normal and breath sounds normal. She has no wheezes. She has no rales.  Abdominal: Soft. Bowel sounds are normal. She exhibits no distension. There is tenderness. There is no rebound and no guarding.        Mild right lower quadrant tenderness without peritoneal signs  Musculoskeletal: Normal range of motion. She exhibits no edema and no tenderness.  Neurological: She is alert and oriented to person, place, and time. No cranial  nerve deficit.  Skin: Skin is warm and dry.  Psychiatric: She has a normal mood and affect. Thought content normal.    ED Course  Procedures (including critical care time)  Labs Reviewed  CBC - Abnormal; Notable for the following:    Hemoglobin 11.9 (*)    HCT 33.9 (*)    All other components within normal limits  BASIC METABOLIC PANEL - Abnormal; Notable for the following:    Sodium 129 (*)    Chloride 92 (*)    GFR calc non Af Amer 85 (*)    All other components within normal limits  URINALYSIS, ROUTINE W REFLEX MICROSCOPIC - Abnormal; Notable for the following:    Specific Gravity, Urine <1.005 (*)    Nitrite POSITIVE (*)    Leukocytes, UA MODERATE (*)    All other components within normal limits  URINE MICROSCOPIC-ADD ON - Abnormal; Notable for the following:    Squamous Epithelial / LPF FEW (*)    Bacteria, UA MANY (*)    All other components within normal limits  DIFFERENTIAL   Dg Chest Port 1 View  07/18/2011  *RADIOLOGY REPORT*  Clinical Data: Shortness of breath, abdominal pain/discomfort, evaluate for pneumonia  PORTABLE CHEST - 1 VIEW  Comparison: 04/08/2011; 01/09/2008  Findings: Grossly unchanged cardiac silhouette and mediastinal contours.  The lungs remain hyperinflated with grossly unchanged linear heterogeneous opacities within the left lower lung, favored to represent subsegmental atelectasis or scar.  There is grossly unchanged mild diffuse thickening of the central airways.  No focal airspace opacities.  No definite pleural effusion or pneumothorax. Unchanged bones.  IMPRESSION: Stable findings of emphysema/bronchitic change without acute cardiopulmonary disease.  Original Report Authenticated By: Waynard Reeds, M.D.     No diagnosis  found.    MDM  Abdominal pain uti        Cheri Guppy, MD 07/21/11 [phone]

## 2011-07-18 NOTE — Telephone Encounter (Signed)
I spoke Ariana [name] and she was calling to see who called EMS for pt this AM bc pt did not remember who called for her. I advised her it was SN nurse bc pt had multiple concerning complaints. Livie also states at pt last OV 07/14/11 SN had asked her about 2 of pt's past medication klonopin and zoloft and what type of side effects pt had with these medications. She stated she does not exactly remember what the klonopin did to pt just remember she had severe side effects from this and the zoloft just caused ehr to sleep all the time. She was only given pt zoloft 25 mg but is wanting to know if they can start giving pt a quarter of this to see if it helps with her agitation since the have rx for this on hold at the pharmacy. Please advise SN thanks

## 2011-07-18 NOTE — ED Notes (Signed)
Gave floor RN report. Unable to transport patient at this time because their is no bed in the room. RN will call and let us know when there is a bed in the room for the patient.

## 2011-07-19 ENCOUNTER — Encounter (HOSPITAL_COMMUNITY): Payer: Self-pay | Admitting: *Deleted

## 2011-07-19 DIAGNOSIS — E871 Hypo-osmolality and hyponatremia: Secondary | ICD-10-CM

## 2011-07-19 DIAGNOSIS — R51 Headache: Secondary | ICD-10-CM

## 2011-07-19 DIAGNOSIS — E782 Mixed hyperlipidemia: Secondary | ICD-10-CM

## 2011-07-19 DIAGNOSIS — R339 Retention of urine, unspecified: Secondary | ICD-10-CM

## 2011-07-19 LAB — COMPREHENSIVE METABOLIC PANEL
AST: 26 U/L (ref 0–37)
Albumin: 3.5 g/dL (ref 3.5–5.2)
Alkaline Phosphatase: 52 U/L (ref 39–117)
CO2: 21 mEq/L (ref 19–32)
Calcium: 8.8 mg/dL (ref 8.4–10.5)
Creatinine, Ser: 0.56 mg/dL (ref 0.50–1.10)
Glucose, Bld: 99 mg/dL (ref 70–99)
Sodium: 133 mEq/L — ABNORMAL LOW (ref 135–145)
Total Bilirubin: 0.6 mg/dL (ref 0.3–1.2)

## 2011-07-19 LAB — RAPID URINE DRUG SCREEN, HOSP PERFORMED
Amphetamines: NOT DETECTED
Benzodiazepines: NOT DETECTED
Cocaine: NOT DETECTED
Opiates: NOT DETECTED

## 2011-07-19 LAB — HEMOGLOBIN A1C
Hgb A1c MFr Bld: 5.7 % — ABNORMAL HIGH (ref <5.7)
Mean Plasma Glucose: 117 mg/dL — ABNORMAL HIGH (ref <117)

## 2011-07-19 LAB — DIFFERENTIAL
Eosinophils Absolute: 0 10*3/uL (ref 0.0–0.7)
Eosinophils Relative: 0 % (ref 0–5)
Lymphocytes Relative: 27 % (ref 12–46)
Lymphs Abs: 1.8 10*3/uL (ref 0.7–4.0)
Monocytes Absolute: 0.7 10*3/uL (ref 0.1–1.0)

## 2011-07-19 LAB — CBC
HCT: 32 % — ABNORMAL LOW (ref 36.0–46.0)
MCH: 31.4 pg (ref 26.0–34.0)
MCV: 87.4 fL (ref 78.0–100.0)
RBC: 3.66 MIL/uL — ABNORMAL LOW (ref 3.87–5.11)
RDW: 12 % (ref 11.5–15.5)
WBC: 6.9 10*3/uL (ref 4.0–10.5)

## 2011-07-19 LAB — LIPID PANEL: LDL Cholesterol: 56 mg/dL (ref 0–99)

## 2011-07-19 LAB — SEDIMENTATION RATE: Sed Rate: 16 mm/hr (ref 0–22)

## 2011-07-19 LAB — T4, FREE: Free T4: 1.07 ng/dL (ref 0.80–1.80)

## 2011-07-19 LAB — GLUCOSE, CAPILLARY
Glucose-Capillary: 127 mg/dL — ABNORMAL HIGH (ref 70–99)
Glucose-Capillary: 131 mg/dL — ABNORMAL HIGH (ref 70–99)

## 2011-07-19 MED ORDER — GABAPENTIN 300 MG PO CAPS
300.0000 mg | ORAL_CAPSULE | Freq: Three times a day (TID) | ORAL | Status: DC
  Administered 2011-07-19 – 2011-07-25 (×18): 300 mg via ORAL
  Filled 2011-07-19 (×20): qty 1

## 2011-07-19 MED ORDER — LORAZEPAM 2 MG/ML IJ SOLN
INTRAMUSCULAR | Status: AC
  Administered 2011-07-19: 1 mg via INTRAVENOUS
  Filled 2011-07-19: qty 1

## 2011-07-19 MED ORDER — POTASSIUM CHLORIDE CRYS ER 20 MEQ PO TBCR
40.0000 meq | EXTENDED_RELEASE_TABLET | Freq: Once | ORAL | Status: AC
  Administered 2011-07-19: 40 meq via ORAL
  Filled 2011-07-19: qty 2

## 2011-07-19 MED ORDER — POLYVINYL ALCOHOL 1.4 % OP SOLN
1.0000 [drp] | Freq: Four times a day (QID) | OPHTHALMIC | Status: DC
  Administered 2011-07-19 – 2011-07-25 (×25): 1 [drp] via OPHTHALMIC

## 2011-07-19 MED ORDER — MAGIC MOUTHWASH W/LIDOCAINE
5.0000 mL | Freq: Four times a day (QID) | ORAL | Status: DC
  Administered 2011-07-19 – 2011-07-25 (×24): 5 mL via ORAL
  Filled 2011-07-19 (×27): qty 5

## 2011-07-19 MED ORDER — ACETAMINOPHEN 325 MG PO TABS
650.0000 mg | ORAL_TABLET | Freq: Four times a day (QID) | ORAL | Status: DC | PRN
  Administered 2011-07-19: 650 mg via ORAL
  Filled 2011-07-19: qty 2

## 2011-07-19 MED ORDER — LORAZEPAM 2 MG/ML IJ SOLN
1.0000 mg | Freq: Once | INTRAMUSCULAR | Status: AC
  Administered 2011-07-19: 1 mg via INTRAVENOUS

## 2011-07-19 MED ORDER — MORPHINE SULFATE 4 MG/ML IJ SOLN
4.0000 mg | Freq: Once | INTRAMUSCULAR | Status: AC
  Administered 2011-07-19: 4 mg via INTRAVENOUS
  Filled 2011-07-19: qty 1

## 2011-07-19 MED ORDER — ENOXAPARIN SODIUM 40 MG/0.4ML ~~LOC~~ SOLN
40.0000 mg | Freq: Every day | SUBCUTANEOUS | Status: DC
  Administered 2011-07-19 – 2011-07-25 (×7): 40 mg via SUBCUTANEOUS
  Filled 2011-07-19 (×7): qty 0.4

## 2011-07-19 MED ORDER — WHITE PETROLATUM GEL
Status: AC
  Administered 2011-07-19: 02:00:00
  Filled 2011-07-19: qty 5

## 2011-07-19 MED ORDER — ENSURE PUDDING PO PUDG
1.0000 | Freq: Three times a day (TID) | ORAL | Status: DC
  Administered 2011-07-19 – 2011-07-25 (×16): 1 via ORAL

## 2011-07-19 NOTE — Care Management Note (Addendum)
    Page 1 of 2   07/25/2011     11:05:02 AM   CARE MANAGEMENT NOTE 07/25/2011  Patient:  Ariana [name], Ariana [name]   Account Number:  [id number]  Date Initiated:  07/19/2011  Documentation initiated by:  Letha Cape  Subjective/Objective Assessment:   dx uti  admit- lives alone     Action/Plan:   pt/st eval- rec hh with 24 hr vs snf   Anticipated DC Date:  07/25/2011   Anticipated DC Plan:  SKILLED NURSING FACILITY  In-house referral  Clinical Social Worker      DC Planning Services  CM consult      Choice offered to / List presented to:  C-4 Adult Children           Status of service:  Completed, signed off Medicare Important Message given?   (If response is "NO", the following Medicare IM given date fields will be blank) Date Medicare IM given:   Date Additional Medicare IM given:    Discharge Disposition:  SKILLED NURSING FACILITY  Per UR Regulation:  Reviewed for med. necessity/level of care/duration of stay  If discussed at Long Length of Stay Meetings, dates discussed:    Comments:  07/25/11 11:01 Letha Cape RN, BSN [phone] patient for dc today to snf, CSW following.  07/24/11 11:52 Letha Cape RN, BSN [phone] patient for snf per weekend conversation with daughter per previous note with NCM.   CSW following.  07/22/2011 1130 Spoke to pt and gave permission to speak daughter Deliah, #[phone]. States she really believes SNF will benefit her mother. Will have meeting with PCP today at 2:00 pm with PCP and SW. Made SW aware that daughter is requesting SNF. SW plans to meet with daughter today. Isidoro Donning RN CCM Case Mgmt phone [phone]  07/20/11 15:03 Letha Cape RN, BSN [phone] per physical therapy recs snf vs hhpt with 24 hr care. Patient states she wants to go home with Home Health,  her daughter Amiee called while I was in the room speaking with patient and she suggested to go with [facility] after I gave her the list over the phone.  Patient also has an aide  with [employer] [phone] who will also be able to increase the amount of hours she can be with patient  during the day, she is already there from 11-4pm ( they pay privately for this).  07/19/11 3:46 Letha Cape RN, BSN [phone] patient lives alone, she has an aide with [employer] from 11-4 Mon-Friday.  Await pt eval.  NCM will continue to follow for dc needs.  Patient has medication coverage , she states she can transport via car.

## 2011-07-19 NOTE — Progress Notes (Signed)
INITIAL ADULT NUTRITION ASSESSMENT Date: 07/19/2011   Time: 11:12 AM Reason for Assessment: Nutrition Risk, dysphagia   ASSESSMENT: Female 78 y.o.  Dx: UTI (lower urinary tract infection)  Hx:  Past Medical History  Diagnosis Date  . Sjogren's syndrome   . Dry eye syndrome   . Hypertension, benign   . Mitral valve prolapse   . GERD (gastroesophageal reflux disease)   . Diverticulosis of colon   . Irritable bowel syndrome   . Urinary incontinence   . Low back pain syndrome   . Fibromyalgia   . Memory loss   . Anxiety and depression   . History of adverse drug reaction   . Peripheral neuropathy     "both feet and legs"  . Shortness of breath 07/18/11    "alot lately"  . Anginal pain   . History of recurrent UTIs   . H/O hiatal hernia   . Anxiety   . Dementia   . Depression     Related Meds:     . aspirin EC  81 mg Oral Daily  . cefTRIAXone (ROCEPHIN)  IV  1 g Intravenous Once  . cefTRIAXone (ROCEPHIN)  IV  1 g Intravenous Q24H  . cycloSPORINE  1 drop Both Eyes Q12H  . digoxin  125 mcg Oral Daily  . diltiazem  180 mg Oral Daily  . donepezil  5 mg Oral QHS  . folic acid  2 mg Oral Daily  . gabapentin  100 mg Oral TID  . hydroxychloroquine  200 mg Oral Daily  . hydroxypropyl methylcellulose  1 drop Both Eyes QID  . LORazepam  1 mg Intravenous Once  . loteprednol  1 drop Both Eyes BID  . metoprolol  25 mg Oral BID  .  morphine injection  4 mg Intravenous Once  . mulitivitamin with minerals  1 tablet Oral Daily  . pantoprazole  40 mg Oral Daily  . pilocarpine  5 mg Oral BID  . polyvinyl alcohol  1 drop Both Eyes BID  . white petrolatum      . DISCONTD: sodium chloride   Intravenous STAT  . DISCONTD: carboxymethylcellulose  1 drop Both Eyes BID  . DISCONTD: iohexol  20 mL Oral Q1 Hr x 2     Ht: 5\' 3"  (160 cm)  Wt: 120 lb 9.6 oz (54.704 kg)  Ideal Wt: 52.4 kg  % Ideal Wt: 104%  Usual Wt:  Wt Readings from Last 10 Encounters:  07/18/11 120 lb 9.6 oz  (54.704 kg)  07/14/11 116 lb 9.6 oz (52.889 kg)  04/08/11 122 lb (55.339 kg)  01/13/11 122 lb 6.4 oz (55.52 kg)  07/08/10 122 lb (55.339 kg)  01/03/10 126 lb 6.1 oz (57.326 kg)  07/06/09 118 lb (53.524 kg)  01/06/09 116 lb 12.8 oz (52.98 kg)  06/01/08 119 lb (53.978 kg)  04/15/08 120 lb 2.1 oz (54.491 kg)    % Usual Wt: 98%  Body mass index is 21.36 kg/(m^2). WNL  Food/Nutrition Related Hx: reports difficulty swallowing and mouth/gum pain PTA.   Labs:  CMP     Component Value Date/Time   NA 133* 07/19/2011 0610   K 3.3* 07/19/2011 0610   CL 101 07/19/2011 0610   CO2 21 07/19/2011 0610   GLUCOSE 99 07/19/2011 0610   BUN 6 07/19/2011 0610   CREATININE 0.56 07/19/2011 0610   CALCIUM 8.8 07/19/2011 0610   PROT 6.7 07/19/2011 0610   ALBUMIN 3.5 07/19/2011 0610   AST 26 07/19/2011 0610  ALT 11 07/19/2011 0610   ALKPHOS 52 07/19/2011 0610   BILITOT 0.6 07/19/2011 0610   GFRNONAA 86* 07/19/2011 0610   GFRAA >90 07/19/2011 0610    Intake/Output Summary (Last 24 hours) at 07/19/11 1114 Last data filed at 07/19/11 0025  Gross per 24 hour  Intake      0 ml  Output   1600 ml  Net  -1600 ml     Diet Order: Dysphagia 1 thin liquids   Supplements/Tube Feeding: none  IVF:    sodium chloride Last Rate: 100 mL/hr at 07/19/11 9604  DISCONTD: sodium chloride Last Rate: 125 mL/hr at 07/18/11 2049  DISCONTD: sennosides-docusate sodium     Estimated Nutritional Needs:   Kcal: 1300-1500  Protein: 55-65 gm  Fluid: > 1.6 L  Pt with hx of difficulty swallowing, seen by SLP for bedside swallow evaluation this morning. SLP recommended D1 with thin liquids r/t difficulty coordinating breathing and swallowing sequence.  Pt weight appears to be stable, true weight may be higher as pt with dehydration on admission.  Pt is at increased nutrition risk r/t to recent change in diet texture. RD will add Ensure pudding between meals, pt is agreeable. Pt is already on daily multivitamin.  RD spoke with pt, unable to  provide weight or intake hx r/t dementia. Pt with difficulty talking r/t SOB.   NUTRITION DIAGNOSIS: -Swallowing difficulty (NI-1.1).  Status: Ongoing  RELATED TO: poor coordination, edentulous   AS EVIDENCE BY: D1 diet with thin liquids  MONITORING/EVALUATION(Goals): Goal: PO intake of meals and supplements will meet >90% of estimated nutrition needs Monitor: PO intake, weight, labs, I/O's  EDUCATION NEEDS: -No education needs identified at this time  INTERVENTION: 1. Add Ensure Pudding TID between meals 2. RD will continue to follow  Dietitian [phone]  DOCUMENTATION CODES Per approved criteria  -Not Applicable    Clarene Duke MARIE 07/19/2011, 11:12 AM

## 2011-07-19 NOTE — Progress Notes (Signed)
PATIENT DETAILS Name: Ariana [name] Age: 78 y.o. Sex: female Date of Birth: Feb 01, 1930 Admit Date: 07/18/2011 ZOX:[id number] M, MD, MD  Subjective: Abdominal pain better Intermittent Headache  Objective: Vital signs in last 24 hours: Filed Vitals:   07/19/11 0415 07/19/11 1013 07/19/11 1026 07/19/11 1052  BP: 125/72 135/73    Pulse: 116 127    Temp: 98.2 F (36.8 C)  97.6 F (36.4 C) 101.1 F (38.4 C)  TempSrc: Oral  Oral Rectal  Resp: 20     Height:      Weight:      SpO2: 94%       Weight change:   Body mass index is 21.36 kg/(m^2).  Intake/Output from previous day:  Intake/Output Summary (Last 24 hours) at 07/19/11 1256 Last data filed at 07/19/11 0025  Gross per 24 hour  Intake      0 ml  Output   1200 ml  Net  -1200 ml    PHYSICAL EXAM: Gen Exam: Awake and mostly alert with clear speech.  Neck: Supple, No JVD.   Chest: B/L Clear.   CVS: S1 S2 Regular, no murmurs.  Abdomen: soft, BS +, non tender, non distended.  Extremities: no edema, lower extremities warm to touch. Neurologic: Non Focal.   Skin: No Rash.   Wounds: N/A.    CONSULTS:  None  LAB RESULTS: CBC  Lab 07/19/11 0610 07/18/11 1033 07/14/11 1312  WBC 6.9 6.7 6.8  HGB 11.5* 11.9* 12.7  HCT 32.0* 33.9* 38.0  PLT 214 232 247.0  MCV 87.4 87.6 94.0  MCH 31.4 30.7 --  MCHC 35.9 35.1 33.3  RDW 12.0 12.0 12.0  LYMPHSABS 1.8 1.3 1.6  MONOABS 0.7 0.5 0.4  EOSABS 0.0 0.0 0.1  BASOSABS 0.0 0.0 0.0  BANDABS -- -- --    Chemistries   Lab 07/19/11 0610 07/18/11 1033 07/14/11 1312  NA 133* 129* 132*  K 3.3* 3.9 4.3  CL 101 92* 92*  CO2 21 24 30   GLUCOSE 99 97 105*  BUN 6 10 12   CREATININE 0.56 0.58 0.6  CALCIUM 8.8 9.6 8.9  MG -- -- --    CBG:  Lab 07/19/11 1159 07/19/11 0813  GLUCAP 127* 90    GFR Estimated Creatinine Clearance: 46.4 ml/min (by C-G formula based on Cr of 0.56).  Coagulation profile No results found for this basename: INR:5,PROTIME:5 in the last 168  hours  Cardiac Enzymes No results found for this basename: CK:3,CKMB:3,TROPONINI:3,MYOGLOBIN:3 in the last 168 hours  No components found with this basename: POCBNP:3 No results found for this basename: DDIMER:2 in the last 72 hours No results found for this basename: HGBA1C:2 in the last 72 hours  Basename 07/19/11 0610  CHOL 128  HDL 61  LDLCALC 56  TRIG 56  CHOLHDL 2.1  LDLDIRECT --    Basename 07/19/11 0610  TSH 1.163  T4TOTAL --  T3FREE 3.1  THYROIDAB --   No results found for this basename: VITAMINB12:2,FOLATE:2,FERRITIN:2,TIBC:2,IRON:2,RETICCTPCT:2 in the last 72 hours No results found for this basename: LIPASE:2,AMYLASE:2 in the last 72 hours  Urine Studies No results found for this basename: UACOL:2,UAPR:2,USPG:2,UPH:2,UTP:2,UGL:2,UKET:2,UBIL:2,UHGB:2,UNIT:2,UROB:2,ULEU:2,UEPI:2,UWBC:2,URBC:2,UBAC:2,CAST:2,CRYS:2,UCOM:2,BILUA:2 in the last 72 hours  MICROBIOLOGY: No results found for this or any previous visit (from the past 240 hour(s)).  RADIOLOGY STUDIES/RESULTS: Ct Head Wo Contrast  07/18/2011  *RADIOLOGY REPORT*  Clinical Data: Headaches and altered level of consciousness.  CT HEAD WITHOUT CONTRAST  Technique:  Contiguous axial images were obtained from the base of the skull through the  vertex without contrast.  Comparison: Head CT scan 02/11/2008.  Findings: There is cortical atrophy.  No evidence of acute abnormality including infarction, hemorrhage, mass lesion, mass effect, midline shift or abnormal extra-axial fluid collection. Calvarium intact.  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  IMPRESSION: No acute finding.  Atrophy noted.  Original Report Authenticated By: Bernadene Bell. Maricela Curet, M.D.   Ct Abdomen Pelvis W Contrast  07/18/2011  *RADIOLOGY REPORT*  Clinical Data: 78 year-old with severe right lower quadrant pain and bloating.  CT ABDOMEN AND PELVIS WITH CONTRAST  Technique:  Multidetector CT imaging of the abdomen and pelvis  was performed following the standard protocol during bolus administration of intravenous contrast.  Contrast: 80mL OMNIPAQUE IOHEXOL 300 MG/ML  SOLN  Comparison: None.  Findings: The lung bases are clear.  There is no evidence for free air.  The urinary bladder is markedly distended and could be the source of the patient's lower abdominal pain.  Uterus has been removed. There is a large amount stool in the distal colon and rectum. There is focal wall narrowing in the left transverse colon, best seen on sequence 2, image 52.  There is also focal wall thickening in the transverse colon on image 50.  The appendix is not confidently identified.  There is no significant free fluid or lymphadenopathy in the abdomen or pelvis.  There is mild fullness of the ureters bilaterally and most likely related to the urinary bladder distention.  There is a 0.9 cm low density exophytic structure in the left kidney which is suggestive for a cyst.  There appears to be additional cortical cysts in the left kidney.  There is fullness of the central intrahepatic bile ducts and the common bile duct measures up to 0.9 cm.  The biliary findings are most likely associated with the cholecystectomy. Portal venous system is patent.  No gross abnormality to the spleen, adrenal and pancreas.  Inadvertently, the entire chest was imaged during the delayed renal images.  There is enlargement of the left thyroid lobe and likely related to a nodule.  This nodule could measures up to 2.7 cm. There is no significant pericardial or pleural fluid.  Ascending thoracic aorta is slightly prominent measuring up to 3.4 cm.  There is no significant chest lymphadenopathy.  The trachea and mainstem bronchi are patent.  The lungs are clear.  No evidence for airspace disease.  There may be scarring near the base the lingula.  Disc space loss at L5-S1.  IMPRESSION: There is marked distention of the urinary bladder and could represent urinary retention.  Two segments  of wall thickening in the transverse colon.  Findings could be associated with peristalsis or inflammation but neoplasm cannot be excluded.  Recommend correlation with prior colonoscopy or further evaluation with colonoscopy.  Evidence for colonic diverticulosis.  Large amount of stool in the distal colon and rectum.  Enlargement of the left thyroid lobe.  Recommend further characterization with ultrasound.  Original Report Authenticated By: Richarda Overlie, M.D.   Dg Chest Port 1 View  07/18/2011  *RADIOLOGY REPORT*  Clinical Data: Shortness of breath, abdominal pain/discomfort, evaluate for pneumonia  PORTABLE CHEST - 1 VIEW  Comparison: 04/08/2011; 01/09/2008  Findings: Grossly unchanged cardiac silhouette and mediastinal contours.  The lungs remain hyperinflated with grossly unchanged linear heterogeneous opacities within the left lower lung, favored to represent subsegmental atelectasis or scar.  There is grossly unchanged mild diffuse thickening of the central airways.  No focal airspace opacities.  No definite pleural effusion or pneumothorax. Unchanged bones.  IMPRESSION: Stable findings of emphysema/bronchitic change without acute cardiopulmonary disease.  Original Report Authenticated By: Waynard Reeds, M.D.    MEDICATIONS: Scheduled Meds:   . aspirin EC  81 mg Oral Daily  . cefTRIAXone (ROCEPHIN)  IV  1 g Intravenous Once  . cefTRIAXone (ROCEPHIN)  IV  1 g Intravenous Q24H  . cycloSPORINE  1 drop Both Eyes Q12H  . digoxin  125 mcg Oral Daily  . diltiazem  180 mg Oral Daily  . donepezil  5 mg Oral QHS  . enoxaparin (LOVENOX) injection  40 mg Subcutaneous Q24H  . feeding supplement  1 Container Oral TID BM  . folic acid  2 mg Oral Daily  . gabapentin  100 mg Oral TID  . hydroxychloroquine  200 mg Oral Daily  . hydroxypropyl methylcellulose  1 drop Both Eyes QID  . LORazepam  1 mg Intravenous Once  . loteprednol  1 drop Both Eyes BID  . metoprolol  25 mg Oral BID  .  morphine injection  4  mg Intravenous Once  . mulitivitamin with minerals  1 tablet Oral Daily  . pantoprazole  40 mg Oral Daily  . pilocarpine  5 mg Oral BID  . polyvinyl alcohol  1 drop Both Eyes BID  . white petrolatum      . DISCONTD: sodium chloride   Intravenous STAT  . DISCONTD: carboxymethylcellulose  1 drop Both Eyes BID  . DISCONTD: iohexol  20 mL Oral Q1 Hr x 2   Continuous Infusions:   . sodium chloride 100 mL/hr at 07/19/11 0804  . DISCONTD: sodium chloride 125 mL/hr at 07/18/11 2049  . DISCONTD: sennosides-docusate sodium     PRN Meds:.acetaminophen, ALPRAZolam, cyclobenzaprine, HYDROcodone-acetaminophen, iohexol, senna-docusate, DISCONTD: sennosides-docusate sodium  Antibiotics: Anti-infectives     Start     Dose/Rate Route Frequency Ordered Stop   07/19/11 1000   hydroxychloroquine (PLAQUENIL) tablet 200 mg        200 mg Oral Daily 07/18/11 2208     07/19/11 1000   cefTRIAXone (ROCEPHIN) 1 g in dextrose 5 % 50 mL IVPB        1 g 100 mL/hr over 30 Minutes Intravenous Every 24 hours 07/18/11 2208     07/18/11 1245   cefTRIAXone (ROCEPHIN) 1 g in dextrose 5 % 50 mL IVPB        1 g 100 mL/hr over 30 Minutes Intravenous  Once 07/18/11 1233 07/18/11 1332          Assessment/Plan: Principal Problem: Altered Mental Status -slight confusion from baseline-per patient -Likely toxic-metabolic encephalopathy   *UTI (lower urinary tract infection) -Febrile this am -Continue with Rocephin -await urine cultures  Urinary Obstruction -continue with foley catheter -continue with Rocephin  Headache -neck very supple -no meningeal signs -ESR-16-doubt Giant cell arteritis -CT head-no acute abnormalities -has neck pain/back pain-abdominal pain as well  Oral Pain -likely 2/2 to dry mouth from Sjogren's -chronic issue -prn magic mouthwash with lidocaine  Hyponatremia -better with hydration -likely 2/2 to dehydration  Hypokalemia -replete K  Neuropathy -continue with Neurontin,  but will increase to 300 mg TID  Anxiety with Depression -was on zoloft till recently-but caused excessive sleepiness-therefore stopped -continue with Xanax-apparently intolerant to Klonopin -given complicated Psych History-will ask Psychiatry to evaluate  Sjogren's Syndrome -continue with Pilocarpine/Plaquenil -follow's up with Dr Terald Sleeper as outpatient  HTN -controlled with Lopressor/Cardizem  MVP -follow up with Cards (Dr Clarene Duke) as outpatient  GERD -PPI  Dysphagia -Dysphagia 1 diet-appreciate speech eval  Dementia -Aricept  Disposition: Remain inpatient-?SNF on d/c  DVT Prophylaxis: Prophylactic Lovenox  Code Status: Full Code  Family Communication -spoke to Daughter-Marguerite [name]-[phone]  Jeoffrey Massed, MD  [facility] Pager [phone]  If 7PM-7AM, please contact night-coverage [email or web address] Password [email or web address] 07/19/2011, 12:56 PM   LOS: 1 day

## 2011-07-19 NOTE — Progress Notes (Addendum)
Around midnightPatient was extremely anxious showing classic anxiety symptoms.  She was SOB and rambles.  VS documented.  MD paged and ativan 1 mg IV was ordered as patient is NPO per barium swallow in am and could not take her usual xanax for anxiety.  Around 0315 the patient complained of pain in bilateral legs; she has a history of neuropathy.  4 mg morphine given per MD order.  It was also observed that patient would exhibit SOB when someone was in the room but would be relaxed when unstimulated.  MD notified around 0600 that patient HR had gone up to the 120s unsustained but she sustained a rate in the 113-119s.  Will continue to monitor.

## 2011-07-19 NOTE — Telephone Encounter (Signed)
Let the daughter know that since the pt is in the hospital we  Can discuss this once she is discharged.  thanks

## 2011-07-19 NOTE — Telephone Encounter (Signed)
Spoke with pt's daughter and notified of recs per SN She verbalized understanding and states nothing further needed 

## 2011-07-19 NOTE — Evaluation (Signed)
Clinical/Bedside Swallow Evaluation Patient Details  Name: Ariana [name] MRN: [id number] Date of Birth: 1929/06/09  Today's Date: 07/19/2011 Time: 4098-1191 SLP Time Calculation (min): 19 min  Past Medical History:  Past Medical History  Diagnosis Date  . Sjogren's syndrome   . Dry eye syndrome   . Hypertension, benign   . Mitral valve prolapse   . GERD (gastroesophageal reflux disease)   . Diverticulosis of colon   . Irritable bowel syndrome   . Urinary incontinence   . Low back pain syndrome   . Fibromyalgia   . Memory loss   . Anxiety and depression   . History of adverse drug reaction   . Peripheral neuropathy     "both feet and legs"  . Shortness of breath 07/18/11    "alot lately"  . Anginal pain   . History of recurrent UTIs   . H/O hiatal hernia   . Anxiety   . Dementia   . Depression    Past Surgical History:  Past Surgical History  Procedure Date  . Vesicovaginal fistula closure w/ tah   . Appendectomy   . Cholecystectomy   . Mandible surgery   . Temporomandibular joint surgery   . Cataract extraction, bilateral   . Abdominal hysterectomy   . Dental surgery     multiple tooth extractions   HPI:  78 yr old admitted with abdominal pain and headache.  Found to havve urine retention.  CT head negative.  CXR stable , no acute disease.  PMH:  anxiety, HTN, hiatal hernia, Sjogren's disease, GERD.  Pt. stated she sometimes has difficulty eating/drinking but unable to elaborate.   Assessment / Plan / Recommendation Clinical Impression  Pt. was very anxious with difficulty coordinating respirations and swallow sequence resulting in poor hyolaryngeal elevation and an effortful swallow.  Pt. was not observed to cough, throat clear or exhibit wet vocal quality , however, she is at higher aspiration risk due to her significant anxiety.  Reccommend Dys 1 diet (doesn't wear dentures to eat and complaining of sore gums), thin liquids and meds whole in applesauce.      Aspiration Risk  Moderate    Diet Recommendation Dysphagia 1 (Puree);Thin liquid   Liquid Administration via: Cup Medication Administration: Whole meds with puree Supervision: Full supervision/cueing for compensatory strategies;Patient able to self feed Compensations: Slow rate;Small sips/bites (take breaks for adequate respirations) Postural Changes and/or Swallow Maneuvers: Seated upright 90 degrees    Other  Recommendations Oral Care Recommendations: Oral care BID   Follow Up Recommendations   (TBD)    Frequency and Duration min 2x/week  2 weeks        SLP Swallow Goals Patient will consume recommended diet without observed clinical signs of aspiration with: Minimal cueing Patient will utilize recommended strategies during swallow to increase swallowing safety with: Minimal cueing   Swallow Study Prior Functional Status       General HPI: 78 yr old admitted with abdominal pain and headache.  Found to havve urine retention.  CT head negative.  CXR stable , no acute disease.  PMH:  anxiety, HTN, hiatal hernia, Sjogren's disease, GERD.  Pt. stated she sometimes has difficulty eating/drinking but unable to elaborate. Type of Study: Bedside swallow evaluation Diet Prior to this Study: NPO Respiratory Status: Supplemental O2 delivered via (comment) History of Recent Intubation: No Behavior/Cognition: Alert;Requires cueing (anxious!) Oral Cavity - Dentition: Edentulous (TOP DENTURE AT HOME, DOESN'T WEAR WHILE EATING ) Self-Feeding Abilities: Needs assist Patient Positioning: Upright in  bed Baseline Vocal Quality: Low vocal intensity (TREMOROUS, DEC RESPIRATORY EFFORT)    Oral/Motor/Sensory Function Overall Oral Motor/Sensory Function: Appears within functional limits for tasks assessed   Ice Chips Ice chips: Not tested   Thin Liquid Thin Liquid: Impaired Presentation: Cup;Straw Oral Phase Impairments:  (DECR  INSPIRATIION FOR SUCK) Oral Phase Functional Implications:  Prolonged oral transit Pharyngeal  Phase Impairments: Decreased hyoid-laryngeal movement    Nectar Thick Nectar Thick Liquid: Not tested   Honey Thick Honey Thick Liquid: Not tested   Puree Puree: Impaired Presentation: Spoon Pharyngeal Phase Impairments: Decreased hyoid-laryngeal movement   Solid Solid: Not tested    Royce Macadamia M.Ed [employer] [phone]  07/19/2011

## 2011-07-20 DIAGNOSIS — E782 Mixed hyperlipidemia: Secondary | ICD-10-CM

## 2011-07-20 DIAGNOSIS — E871 Hypo-osmolality and hyponatremia: Secondary | ICD-10-CM

## 2011-07-20 DIAGNOSIS — R51 Headache: Secondary | ICD-10-CM

## 2011-07-20 DIAGNOSIS — R339 Retention of urine, unspecified: Secondary | ICD-10-CM

## 2011-07-20 DIAGNOSIS — F411 Generalized anxiety disorder: Secondary | ICD-10-CM | POA: Diagnosis present

## 2011-07-20 LAB — GLUCOSE, CAPILLARY: Glucose-Capillary: 101 mg/dL — ABNORMAL HIGH (ref 70–99)

## 2011-07-20 LAB — URINE CULTURE
Colony Count: NO GROWTH
Culture: NO GROWTH

## 2011-07-20 MED ORDER — BUSPIRONE HCL 10 MG PO TABS
10.0000 mg | ORAL_TABLET | Freq: Three times a day (TID) | ORAL | Status: DC
  Administered 2011-07-20 – 2011-07-25 (×15): 10 mg via ORAL
  Filled 2011-07-20 (×17): qty 1

## 2011-07-20 MED ORDER — DULOXETINE HCL 30 MG PO CPEP
30.0000 mg | ORAL_CAPSULE | Freq: Every day | ORAL | Status: DC
  Administered 2011-07-20 – 2011-07-22 (×3): 30 mg via ORAL
  Filled 2011-07-20 (×4): qty 1

## 2011-07-20 NOTE — Progress Notes (Signed)
PATIENT DETAILS Name: Ariana [name] Age: 78 y.o. Sex: female Date of Birth: [birth year] Admit Date: 07/18/2011 ZOX:[id number] M, MD, MD  Subjective: Abdominal pain significantly better Headache almost resolved  Objective: Vital signs in last 24 hours: Filed Vitals:   07/19/11 2126 07/20/11 0537 07/20/11 0850 07/20/11 0939  BP: 115/66 128/73 131/68 153/70  Pulse: 70 60 73 63  Temp: 97.6 F (36.4 C) 97.4 F (36.3 C)  97.4 F (36.3 C)  TempSrc: Oral Oral  Axillary  Resp: 20 14  18   Height:      Weight:      SpO2: 100% 100%  100%    Weight change:   Body mass index is 21.36 kg/(m^2).  Intake/Output from previous day:  Intake/Output Summary (Last 24 hours) at 07/20/11 1335 Last data filed at 07/20/11 0900  Gross per 24 hour  Intake 2049.33 ml  Output   1600 ml  Net 449.33 ml    PHYSICAL EXAM: Gen Exam: Awake and mostly alert with clear speech.  Neck: Supple, No JVD.   Chest: B/L Clear.No rales   CVS: S1 S2 Regular, no murmurs.  Abdomen: soft, BS +, non tender, non distended.  Extremities: no edema, lower extremities warm to touch. Neurologic: Non Focal.   Skin: No Rash.   Wounds: N/A.    CONSULTS:  None  LAB RESULTS: CBC  Lab 07/19/11 0610 07/18/11 1033 07/14/11 1312  WBC 6.9 6.7 6.8  HGB 11.5* 11.9* 12.7  HCT 32.0* 33.9* 38.0  PLT 214 232 247.0  MCV 87.4 87.6 94.0  MCH 31.4 30.7 --  MCHC 35.9 35.1 33.3  RDW 12.0 12.0 12.0  LYMPHSABS 1.8 1.3 1.6  MONOABS 0.7 0.5 0.4  EOSABS 0.0 0.0 0.1  BASOSABS 0.0 0.0 0.0  BANDABS -- -- --    Chemistries   Lab 07/19/11 0610 07/18/11 1033 07/14/11 1312  NA 133* 129* 132*  K 3.3* 3.9 4.3  CL 101 92* 92*  CO2 21 24 30   GLUCOSE 99 97 105*  BUN 6 10 12   CREATININE 0.56 0.58 0.6  CALCIUM 8.8 9.6 8.9  MG -- -- --    CBG:  Lab 07/20/11 1204 07/20/11 0746 07/19/11 2129 07/19/11 1722 07/19/11 1159  GLUCAP 159* 101* 131* 117* 127*    GFR Estimated Creatinine Clearance: 46.4 ml/min (by C-G formula based  on Cr of 0.56).  Coagulation profile No results found for this basename: INR:5,PROTIME:5 in the last 168 hours  Cardiac Enzymes No results found for this basename: CK:3,CKMB:3,TROPONINI:3,MYOGLOBIN:3 in the last 168 hours  No components found with this basename: POCBNP:3 No results found for this basename: DDIMER:2 in the last 72 hours  Basename 07/18/11 2231  HGBA1C 5.7*    Basename 07/19/11 0610  CHOL 128  HDL 61  LDLCALC 56  TRIG 56  CHOLHDL 2.1  LDLDIRECT --    Basename 07/19/11 0610  TSH 1.163  T4TOTAL --  T3FREE 3.1  THYROIDAB --   No results found for this basename: VITAMINB12:2,FOLATE:2,FERRITIN:2,TIBC:2,IRON:2,RETICCTPCT:2 in the last 72 hours No results found for this basename: LIPASE:2,AMYLASE:2 in the last 72 hours  Urine Studies No results found for this basename: UACOL:2,UAPR:2,USPG:2,UPH:2,UTP:2,UGL:2,UKET:2,UBIL:2,UHGB:2,UNIT:2,UROB:2,ULEU:2,UEPI:2,UWBC:2,URBC:2,UBAC:2,CAST:2,CRYS:2,UCOM:2,BILUA:2 in the last 72 hours  MICROBIOLOGY: Recent Results (from the past 240 hour(s))  URINE CULTURE     Status: Normal   Collection Time   07/19/11 12:22 AM      Component Value Range Status Comment   Specimen Description URINE, CATHETERIZED   Final    Special Requests NONE   Final  Culture  Setup Time [id number]   Final    Colony Count NO GROWTH   Final    Culture NO GROWTH   Final    Report Status 07/20/2011 FINAL   Final     RADIOLOGY STUDIES/RESULTS: Ct Head Wo Contrast  07/18/2011  *RADIOLOGY REPORT*  Clinical Data: Headaches and altered level of consciousness.  CT HEAD WITHOUT CONTRAST  Technique:  Contiguous axial images were obtained from the base of the skull through the vertex without contrast.  Comparison: Head CT scan 02/11/2008.  Findings: There is cortical atrophy.  No evidence of acute abnormality including infarction, hemorrhage, mass lesion, mass effect, midline shift or abnormal extra-axial fluid collection. Calvarium intact.  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  IMPRESSION: No acute finding.  Atrophy noted.  Original Report Authenticated By: Bernadene Bell. Maricela Curet, M.D.   Ct Abdomen Pelvis W Contrast  07/18/2011  *RADIOLOGY REPORT*  Clinical Data: 78 year-old with severe right lower quadrant pain and bloating.  CT ABDOMEN AND PELVIS WITH CONTRAST  Technique:  Multidetector CT imaging of the abdomen and pelvis was performed following the standard protocol during bolus administration of intravenous contrast.  Contrast: 80mL OMNIPAQUE IOHEXOL 300 MG/ML  SOLN  Comparison: None.  Findings: The lung bases are clear.  There is no evidence for free air.  The urinary bladder is markedly distended and could be the source of the patient's lower abdominal pain.  Uterus has been removed. There is a large amount stool in the distal colon and rectum. There is focal wall narrowing in the left transverse colon, best seen on sequence 2, image 52.  There is also focal wall thickening in the transverse colon on image 50.  The appendix is not confidently identified.  There is no significant free fluid or lymphadenopathy in the abdomen or pelvis.  There is mild fullness of the ureters bilaterally and most likely related to the urinary bladder distention.  There is a 0.9 cm low density exophytic structure in the left kidney which is suggestive for a cyst.  There appears to be additional cortical cysts in the left kidney.  There is fullness of the central intrahepatic bile ducts and the common bile duct measures up to 0.9 cm.  The biliary findings are most likely associated with the cholecystectomy. Portal venous system is patent.  No gross abnormality to the spleen, adrenal and pancreas.  Inadvertently, the entire chest was imaged during the delayed renal images.  There is enlargement of the left thyroid lobe and likely related to a nodule.  This nodule could measures up to 2.7 cm. There is no significant pericardial or pleural fluid.   Ascending thoracic aorta is slightly prominent measuring up to 3.4 cm.  There is no significant chest lymphadenopathy.  The trachea and mainstem bronchi are patent.  The lungs are clear.  No evidence for airspace disease.  There may be scarring near the base the lingula.  Disc space loss at L5-S1.  IMPRESSION: There is marked distention of the urinary bladder and could represent urinary retention.  Two segments of wall thickening in the transverse colon.  Findings could be associated with peristalsis or inflammation but neoplasm cannot be excluded.  Recommend correlation with prior colonoscopy or further evaluation with colonoscopy.  Evidence for colonic diverticulosis.  Large amount of stool in the distal colon and rectum.  Enlargement of the left thyroid lobe.  Recommend further characterization with ultrasound.  Original Report Authenticated By: Richarda Overlie, M.D.  Dg Chest Port 1 View  07/18/2011  *RADIOLOGY REPORT*  Clinical Data: Shortness of breath, abdominal pain/discomfort, evaluate for pneumonia  PORTABLE CHEST - 1 VIEW  Comparison: 04/08/2011; 01/09/2008  Findings: Grossly unchanged cardiac silhouette and mediastinal contours.  The lungs remain hyperinflated with grossly unchanged linear heterogeneous opacities within the left lower lung, favored to represent subsegmental atelectasis or scar.  There is grossly unchanged mild diffuse thickening of the central airways.  No focal airspace opacities.  No definite pleural effusion or pneumothorax. Unchanged bones.  IMPRESSION: Stable findings of emphysema/bronchitic change without acute cardiopulmonary disease.  Original Report Authenticated By: Waynard Reeds, M.D.    MEDICATIONS: Scheduled Meds:    . aspirin EC  81 mg Oral Daily  . busPIRone  10 mg Oral TID  . cefTRIAXone (ROCEPHIN)  IV  1 g Intravenous Q24H  . cycloSPORINE  1 drop Both Eyes Q12H  . digoxin  125 mcg Oral Daily  . diltiazem  180 mg Oral Daily  . donepezil  5 mg Oral QHS  .  DULoxetine  30 mg Oral Daily  . enoxaparin (LOVENOX) injection  40 mg Subcutaneous Daily  . feeding supplement  1 Container Oral TID BM  . folic acid  2 mg Oral Daily  . gabapentin  300 mg Oral TID  . hydroxychloroquine  200 mg Oral Daily  . loteprednol  1 drop Both Eyes BID  . magic mouthwash w/lidocaine  5 mL Oral QID  . metoprolol  25 mg Oral BID  . multivitamin with minerals  1 tablet Oral Daily  . pantoprazole  40 mg Oral Daily  . pilocarpine  5 mg Oral BID  . polyvinyl alcohol  1 drop Both Eyes QID  . potassium chloride  40 mEq Oral Once  . DISCONTD: hydroxypropyl methylcellulose  1 drop Both Eyes QID  . DISCONTD: polyvinyl alcohol  1 drop Both Eyes BID   Continuous Infusions:    . sodium chloride 20 mL/hr at 07/19/11 1322   PRN Meds:.acetaminophen, ALPRAZolam, cyclobenzaprine, HYDROcodone-acetaminophen, senna-docusate  Antibiotics: Anti-infectives     Start     Dose/Rate Route Frequency Ordered Stop   07/19/11 1000   hydroxychloroquine (PLAQUENIL) tablet 200 mg        200 mg Oral Daily 07/18/11 2208     07/19/11 1000   cefTRIAXone (ROCEPHIN) 1 g in dextrose 5 % 50 mL IVPB        1 g 100 mL/hr over 30 Minutes Intravenous Every 24 hours 07/18/11 2208     07/18/11 1245   cefTRIAXone (ROCEPHIN) 1 g in dextrose 5 % 50 mL IVPB        1 g 100 mL/hr over 30 Minutes Intravenous  Once 07/18/11 1233 07/18/11 1332          Assessment/Plan: Principal Problem: Altered Mental Status -more alert and seems to be back to usual baseline -slight confusion from baseline-per patient's daughter-07/19/11 -Likely toxic-metabolic encephalopathy   *UTI (lower urinary tract infection) -Febrile on 6/5-no fever for past 24 hours -urine culture showing no growth-since febrile on 6/5-will continue with Rocephin  Urinary Obstruction -continue with foley catheter-spoke with Dr Ottelin-case/history/labs/radiology discussed, he suggests to leave foley in for a 2-3 weeks, and have patient  follow up with him on discharge -continue with Rocephin  Headache -almost resolved today -neck very supple -no meningeal signs -ESR-16-doubt Giant cell arteritis -CT head-no acute abnormalities -has neck pain/back pain-abdominal pain as well  Oral Pain -likely 2/2 to dry mouth from Sjogren's -  chronic issue -prn magic mouthwash with lidocaine  Hyponatremia -better with hydration -likely 2/2 to dehydration  Hypokalemia -replete K  Neuropathy -continue with Neurontin, but will increase to 300 mg TID  Anxiety with Depression -was on zoloft till recently-but caused excessive sleepiness-therefore stopped -continue with Xanax-apparently intolerant to Klonopin -spoke with Psych MD-Dr Jonna Munro start Buspar and Cymbalta  Sjogren's Syndrome -continue with Pilocarpine/Plaquenil -follow's up with Dr Terald Sleeper as outpatient  HTN -controlled with Lopressor/Cardizem  MVP -follow up with Cards (Dr Clarene Duke) as outpatient  GERD -PPI  Dysphagia -Dysphagia 1 diet-appreciate speech eval  Dementia -Aricept  Disposition: Remain inpatient-?SNF on d/c  DVT Prophylaxis: Prophylactic Lovenox  Code Status: Full Code  Family Communication -spoke to Daughter-Ariana [name] on 6/5  Jeoffrey Massed, MD  [facility] Pager [phone]  If 7PM-7AM, please contact night-coverage [email or web address] Password [email or web address] 07/20/2011, 1:35 PM   LOS: 2 days

## 2011-07-20 NOTE — Consult Note (Signed)
Clinical Social Work Department CLINICAL SOCIAL WORK PSYCHIATRY SERVICE LINE ASSESSMENT 07/20/2011  Patient:  Ariana [name]  Account:  [id number]  Admit Date:  07/18/2011  Clinical Social Worker:  Unk Lightning, LCSW  Date/Time:  07/20/2011 12:00 N Referred by:  Physician  Date referred:  07/20/2011 Reason for Referral  Behavioral Health Issues   Presenting Symptoms/Problems (In the person's/family's own words):   "I'm just sad and anxious"   Abuse/Neglect/Trauma History (check all that apply)  Denies history   Abuse/Neglect/Trauma Comments:   Psychiatric History (check all that apply)  Outpatient treatment   Psychiatric medications:  Xanax   Current Mental Health Hospitalizations/Previous Mental Health History:   None reported. PCP prescribes medications   Current provider:   PCP   Place and Date:   Every 3 month check-up   Current Medications:   acetaminophen, ALPRAZolam, cyclobenzaprine, HYDROcodone-acetaminophen, senna-docusate                        . aspirin EC  81 mg Oral Daily  . busPIRone  10 mg Oral TID  . cefTRIAXone (ROCEPHIN)  IV  1 g Intravenous Q24H  . cycloSPORINE  1 drop Both Eyes Q12H  . digoxin  125 mcg Oral Daily  . diltiazem  180 mg Oral Daily  . donepezil  5 mg Oral QHS  . DULoxetine  30 mg Oral Daily  . enoxaparin (LOVENOX) injection  40 mg Subcutaneous Daily  . feeding supplement  1 Container Oral TID BM  . folic acid  2 mg Oral Daily  . gabapentin  300 mg Oral TID  . hydroxychloroquine  200 mg Oral Daily  . loteprednol  1 drop Both Eyes BID  . magic mouthwash w/lidocaine  5 mL Oral QID  . metoprolol  25 mg Oral BID  . multivitamin with minerals  1 tablet Oral Daily  . pantoprazole  40 mg Oral Daily  . pilocarpine  5 mg Oral BID  . polyvinyl alcohol  1 drop Both Eyes QID   Previous Impatient Admission/Date/Reason:   None reported   Emotional Health / Current Symptoms    Suicide/Self Harm  None reported   Suicide attempt in the  past:   Other harmful behavior:   Psychotic/Dissociative Symptoms  None reported   Other Psychotic/Dissociative Symptoms:    Attention/Behavioral Symptoms  Within Normal Limits   Other Attention / Behavioral Symptoms:    Cognitive Impairment  Within Normal Limits   Other Cognitive Impairment:    Mood and Adjustment  DEPRESSION    Stress, Anxiety, Trauma, Any Recent Loss/Stressor  Grief/Loss (recent or history)   Anxiety (frequency):   Phobia (specify):   Compulsive behavior (specify):   Obsessive behavior (specify):   Other:   Patient's son passed away in 2010/03/30. Patient feels disconnected from other family.   Substance Abuse/Use  None   SBIRT completed (please refer for detailed history):  N  Self-reported substance use:   None reported   Urinary Drug Screen Completed:  N Alcohol level:   N/A    Environmental/Housing/Living Arrangement  Stable housing   Who is in the home:   Alone   Emergency contact:  Cher-dtr   Financial  Medicare   Patient's Strengths and Goals (patient's own words):   "I am strong and take all my medicine like I am supposed to"   Clinical Social Worker's Interpretive Summary:   CSW received referral due to patient being anxious. CSW reviewed chart which stated patient  has had periods of anxiety. CSW met with patient at bedside. No visitors present.    CSW introduced myself and explained role. Patient spoke about her admission to hospital and her family life. Patient has two children. One son and one dtr. Her son passed away in 04-09-10. Patient became tearful when discussing her son's death. Patient reports that dtr and son-in-law assist her but reports she does not feel as connected with family. CSW inquired if patient has spoken with her family regarding her concerns. Patient reports she does not want to be a burden and just does not feel as emotionally connected to dtr as she did for her son. CSW encouraged patient to  discuss problems with dtr. CSW spoke with patient regarding her depression and anxiety related to son's death. Patient reports that she takes some medication to help with her nerves since his death. Patient reports she has not been to counseling and is not interested in counseling. CSW allowed patient time to express her grief. Patient was appropriate during assessment. Patient reports no hallucinations, delusions, SI or HI. Patient was tearful but had appropriate affect for the loss of her son.    CSW called patient's dtr who reports that patient does have anxiety but reports that her anxiety has been worse since being in the hospital. Dtr reports that in Feb 2012 they took patient to the ED for panic attacks. At that time, hospital referred patient to psychiatrist. Dtr reports that patient went for a few visits but then stated that she became offended by MD and refused to go back. Dtr reports that PCP prescribes Xanax now. Dtr states that patient feels a stigma against counseling and states that she will not go back. Dtr reports that patient has tried Zoloft and Klonopin in the past but they were not helpful and she is not taking them. Dtr reported that she is not concerned about patient's anxiety currently because she feels it is directly related to admission and is not patient's baseline. Dtr reports that patient should become less anxious once she returns home. Dtr spoke about dc plans and reported that patient has aide 4 hours a day. Dtr reports that if more care is needed then they will discuss SNF vs ALF vs HH after PT evaluation.    CSW gave dtr community resources for medication management and counseling. Dtr reports a family friend is a Therapist, sports and they will see him if needed.   Disposition:  Outpatient referral made/needed   Coverage for [facility]

## 2011-07-20 NOTE — Progress Notes (Signed)
Speech Language Pathology Dysphagia Treatment Patient Details Name: Ariana [name] MRN: [id number] DOB: 04-07-1929 Today's Date: 07/20/2011 Time: 4098-1191 SLP Time Calculation (min): 12 min  Assessment / Plan / Recommendation Clinical Impression  Pt. seen for dysphagia treatment and she is much calmer today.  Increased ability to coordinate swallow with respirations without s/s aspiration.  She declined cracker due to gum pain (not abrasion etc observed) and prefers to remain on Dys 1 (puree) diet.  Recommend she donn dentures when discharged (dentures not in hospital) for ability to upgrade texture.  No further ST is recommended at this time.    Diet Recommendation  Continue with Current Diet: Dysphagia 1 (puree);Thin liquid    SLP Plan Discharge SLP treatment due to (comment);Other (Comment) (goals met)       Swallowing Goals  SLP Swallowing Goals Patient will consume recommended diet without observed clinical signs of aspiration with: Supervision/safety Swallow Study Goal #1 - Progress: Met Patient will utilize recommended strategies during swallow to increase swallowing safety with: Supervision/safety Swallow Study Goal #2 - Progress: Met  General Temperature Spikes Noted: No Respiratory Status: Supplemental O2 delivered via (comment) Behavior/Cognition: Alert;Cooperative;Pleasant mood Oral Cavity - Dentition: Edentulous Patient Positioning: Upright in bed  Oral Cavity - Oral Hygiene Does patient have any of the following "at risk" factors?: Saliva - thick, dry mouth Brush patient's teeth BID with toothbrush (using toothpaste with fluoride): Yes Patient is HIGH RISK - Oral Care Protocol followed (see row info): Yes   Dysphagia Treatment Treatment focused on: Skilled observation of diet tolerance;Facilitation of pharyngeal phase;Facilitation of oral phase Treatment Methods/Modalities: Skilled observation Patient observed directly with PO's: Yes Type of PO's observed: Thin  liquids Feeding: Able to feed self Liquids provided via: Straw Type of cueing:  (none needed)   Royce Macadamia M.Ed [employer] [phone]  07/20/2011

## 2011-07-20 NOTE — Evaluation (Signed)
Physical Therapy Evaluation Patient Details Name: Ariana [name] MRN: [id number] DOB: April 27, 1929 Today's Date: 07/20/2011 Time: 1325-1405 PT Time Calculation (min): 40 min  PT Assessment / Plan / Recommendation Clinical Impression  pt admitted with UTI. Now on evaluation,she is deconditioned, mildly weak and unsteady.  Recommend ST SNF for rehab or more family assist than the 4 hours/day PCA assist and HHPT/    PT Assessment  Patient needs continued PT services    Follow Up Recommendations  Skilled nursing facility;Other (comment) (vs HHPT and supervision for mobility (more assist than 4 hrs)    Barriers to Discharge Decreased caregiver support      lEquipment Recommendations  None recommended by PT    Recommendations for Other Services     Frequency Min 3X/week    Precautions / Restrictions Precautions Precautions: Fall Restrictions Weight Bearing Restrictions: No   Pertinent Vitals/Pain       Mobility  Bed Mobility Bed Mobility: Supine to Sit;Sitting - Scoot to Edge of Bed;Sit to Supine Supine to Sit: 5: Supervision;HOB elevated Sitting - Scoot to Edge of Bed: 5: Supervision Sit to Supine: 5: Supervision;HOB flat Details for Bed Mobility Assistance: no cuing need, close for safety only Transfers Transfers: Sit to Stand;Stand to Sit Sit to Stand: 4: Min guard Stand to Sit: 4: Min guard Details for Transfer Assistance: pt midly unsteady getting up and keeping her legs against the bed for support; vc for hand placement/safety, close by for safety Ambulation/Gait Ambulation/Gait Assistance: 4: Min assist Ambulation Distance (Feet): 250 Feet Assistive device: None;Other (Comment) (vs using rail) Ambulation/Gait Assistance Details: mildly unsteady and tremulous with gait; min A without A device; min guard when pt was using the rail. Gait Pattern: Step-through pattern;Decreased step length - right;Decreased step length - left;Decreased stride length Stairs: No      Exercises     PT Diagnosis: Generalized weakness  PT Problem List: Decreased strength;Decreased activity tolerance;Decreased balance;Decreased mobility;Decreased knowledge of use of DME PT Treatment Interventions: DME instruction;Gait training;Functional mobility training;Therapeutic activities;Balance training;Patient/family education   PT Goals Acute Rehab PT Goals PT Goal Formulation: With patient Time For Goal Achievement: 07/20/11 Potential to Achieve Goals: Good Pt will go Supine/Side to Sit: Independently;with HOB 0 degrees PT Goal: Supine/Side to Sit - Progress: Goal set today Pt will go Sit to Stand: with modified independence PT Goal: Sit to Stand - Progress: Goal set today Pt will Transfer Bed to Chair/Chair to Bed: with modified independence PT Transfer Goal: Bed to Chair/Chair to Bed - Progress: Goal set today Pt will Ambulate: >150 feet;with modified independence;with least restrictive assistive device PT Goal: Ambulate - Progress: Goal set today  Visit Information  Last PT Received On: 07/20/11 Assistance Needed: +1    Subjective Data  Subjective: My daught is not well either and my son in law is run ragged Patient Stated Goal: Home Independent   Prior Functioning  Home Living Lives With: Spouse Available Help at Discharge: Other (Comment);Personal care attendant (4 hours/day M-F; otherwise not other options given) Type of Home: House Home Access: Level entry Home Layout: One level Bathroom Shower/Tub: Engineer, manufacturing systems: Standard Home Adaptive Equipment: Bedside commode/3-in-1;Grab bars in shower;Straight cane;Wheelchair - manual Prior Function Level of Independence: Independent with assistive device(s) Able to Take Stairs?: Yes Driving: No Communication Communication: No difficulties    Cognition  Overall Cognitive Status: Appears within functional limits for tasks assessed/performed Arousal/Alertness: Awake/alert Orientation Level:  Oriented X4 / Intact Behavior During Session: Anxious    Extremity/Trunk Assessment  Right Upper Extremity Assessment RUE ROM/Strength/Tone: Within functional levels Left Upper Extremity Assessment LUE ROM/Strength/Tone: Within functional levels Right Lower Extremity Assessment RLE ROM/Strength/Tone: Within functional levels Left Lower Extremity Assessment LLE ROM/Strength/Tone:  (mildly weak Bil incl stiffness) Trunk Assessment Trunk Assessment: Normal   Balance Balance Balance Assessed: Yes Static Sitting Balance Static Sitting - Balance Support: Feet supported;No upper extremity supported Static Sitting - Level of Assistance: 5: Stand by assistance Static Standing Balance Static Standing - Balance Support: During functional activity;Right upper extremity supported;Left upper extremity supported Static Standing - Level of Assistance: 5: Stand by assistance  End of Session PT - End of Session Activity Tolerance: Patient tolerated treatment well Patient left: in bed;with call bell/phone within reach;with family/visitor present Nurse Communication: Mobility status   [name], [name] 07/20/2011, 2:25 PM  07/20/2011  [place], [place] [phone] (pager)

## 2011-07-20 NOTE — Consult Note (Addendum)
Patient Identification:  Ariana [name] Date of Evaluation:  07/20/2011 Reason for Consult:  ANXIETY  Referring Provider:  Dr. Jerral Ralph History of Present Illness:  Pt has very complex medical problems, anxiety and grief predominate.  She is admitted for UTI,; has memory loss, Sjogren's Ds. And c/o pain from dentures.  Past Psychiatric History:Seen by several PCP, rheumatologist, cardiologist in the community.   Past Medical History:     Past Medical History  Diagnosis Date  . Sjogren's syndrome   . Dry eye syndrome   . Hypertension, benign   . Mitral valve prolapse   . GERD (gastroesophageal reflux disease)   . Diverticulosis of colon   . Irritable bowel syndrome   . Urinary incontinence   . Low back pain syndrome   . Fibromyalgia   . Memory loss   . Anxiety and depression   . History of adverse drug reaction   . Peripheral neuropathy     "both feet and legs"  . Shortness of breath 07/18/11    "alot lately"  . Anginal pain   . History of recurrent UTIs   . H/O hiatal hernia   . Anxiety   . Dementia   . Depression        Past Surgical History  Procedure Date  . Vesicovaginal fistula closure w/ tah   . Appendectomy   . Cholecystectomy   . Mandible surgery   . Temporomandibular joint surgery   . Cataract extraction, bilateral   . Abdominal hysterectomy   . Dental surgery     multiple tooth extractions    Allergies:  Allergies  Allergen Reactions  . Codeine Nausea Only    unless given with Phenergan  . Doxycycline     Unknown  . Klonopin (Clonazepam)     Causes hallucination   . Meperidine Hcl Nausea Only    unless given with Phenergan  . Naproxen   . Norflex (Orphenadrine Citrate) Nausea Only    Unless given with Phenergan  . Oxycodone-Acetaminophen Nausea Only    unless given with phenergan  . Penicillins     Unknown  . Phenothiazines     Unknown  . Propoxyphene Hcl Nausea Only    unless given with phenergan  . Stelazine     Unknown  .  Sulfamethoxazole W-Trimethoprim     Unknown  . Tolectin (Tolmetin Sodium)     Unknown  . Tramadol     Unknown  . Zoloft (Sertraline Hcl)     Caused pt to sleep a lot  . Lubiprostone Rash    Current Medications:  Prior to Admission medications   Medication Sig Start Date End Date Taking? Authorizing Provider  ALPRAZolam Prudy Feeler) 0.5 MG tablet Take 0.5 mg by mouth 2 (two) times daily as needed. For anxiety 01/13/11  Yes [name], MD  aspirin 81 MG tablet Take 81 mg by mouth daily.     Yes Historical Provider, MD  Calcium Carbonate-Vitamin D (CALTRATE 600+D) 600-400 MG-UNIT per tablet Take 2 tablets by mouth daily.     Yes Historical Provider, MD  carboxymethylcellulose (REFRESH TEARS) 0.5 % SOLN 1 drop 2 (two) times daily.     Yes Historical Provider, MD  cyclobenzaprine (FLEXERIL) 10 MG tablet Take 1/2 to 1 tablet by mouth 4 times daily as needed for msucle spasm 01/13/11  Yes [name], MD  cycloSPORINE (RESTASIS) 0.05 % ophthalmic emulsion Place 1 drop into both eyes every 12 (twelve) hours.    Yes Historical Provider, MD  dicyclomine (BENTYL) 20 MG tablet TAKE 1 TABLET EVERY 6 HOURS AS NEEDED FOR ABDOMINAL CRAMPING 02/13/11  Yes [name], MD  digoxin (LANOXIN) 0.125 MG tablet Take 125 mcg by mouth daily.     Yes Historical Provider, MD  diltiazem (CARDIZEM CD) 180 MG 24 hr capsule Take 1 capsule (180 mg total) by mouth daily. 01/13/11  Yes [name], MD  donepezil (ARICEPT) 10 MG tablet Take 1/2 tablet by mouth daily x 1 month, then increase to 1 tablet by mouth daily. 07/14/11  Yes [name], MD  folic acid (FOLVITE) 1 MG tablet Take 2 mg by mouth daily.   Yes Historical Provider, MD  gabapentin (NEURONTIN) 100 MG capsule Take 1 capsule (100 mg total) by mouth 3 (three) times daily. 01/26/11 01/26/12 Yes [name], MD  HYDROcodone-acetaminophen (VICODIN) 5-500 MG per tablet Take 1 tablet by mouth three times daily as needed for pain--DO NOT EXCEED 3 PER DAY.  01/13/11  Yes [name], MD  hydroxychloroquine (PLAQUENIL) 200 MG tablet Take 200 mg by mouth daily. As directed by Dr. Corliss Skains    Yes Historical Provider, MD  hydroxypropyl methylcellulose (ISOPTO TEARS) 2.5 % ophthalmic solution Place 1 drop into both eyes 4 (four) times daily.   Yes Historical Provider, MD  lansoprazole (PREVACID) 30 MG capsule Take 1 capsule (30 mg total) by mouth daily. 01/13/11  Yes [name], MD  loteprednol (LOTEMAX) 0.5 % ophthalmic suspension Place 1 drop into both eyes 2 (two) times daily.   Yes Historical Provider, MD  metoprolol (LOPRESSOR) 50 MG tablet Take 25 mg by mouth 2 (two) times daily.   Yes Historical Provider, MD  Multiple Vitamin (MULTIVITAMIN) tablet Take 1 tablet by mouth daily.     Yes Historical Provider, MD  Omega-3 Fatty Acids (FISH OIL) 1000 MG CAPS Take 1 capsule by mouth daily.     Yes Historical Provider, MD  pilocarpine (SALAGEN) 5 MG tablet Take 5 mg by mouth 2 (two) times daily. As directed by Dr. Corliss Skains    Yes Historical Provider, MD  polyethylene glycol [facility] / Ethelene Hal) packet Take 17 g by mouth daily.     Yes Historical Provider, MD  sennosides-docusate sodium (SENOKOT-S) 8.6-50 MG tablet 1-2 tabs by mouth at bedtime    Yes Historical Provider, MD  estrogens, conjugated, (PREMARIN) 0.9 MG tablet Take 0.9 mg by mouth daily. Take daily for 21 days then do not take for 7 days.     Historical Provider, MD  furosemide (LASIX) 20 MG tablet Take one tablet by mouth every other day  07/15/10 07/15/11  [name], MD    Social History:    reports that she has quit smoking. She has never used smokeless tobacco. She reports that she does not drink alcohol. Her drug history not on file.   Family History:    Family History  Problem Relation Age of Onset  . Colon cancer    . Heart disease Father   . Pneumonia Mother   . Heart attack Mother   . Cancer Sister   . Cancer Brother     Mental Status  Examination/Evaluation: Objective:  Appearance: Disheveled and appears stated age  Psychomotor Activity:  Increased  Eye Contact::  Good  Speech:  some dysarthria, nervousness while she speaks causes flow to be irregular  Volume:  Decreased  Mood:  Anxious  Affect:  Congruent  Thought Process:  Irrelevant and sequential, Very circumstantial, logical  Orientation:  Full  Thought Content:  Preoccupied by past events, death of son and personal symptoms  Suicidal Thoughts:  No  Homicidal Thoughts:  No  Judgement:  Fair Difficulty in cooperating with family members trying to help; confused about meds  Insight:  Absent    DIAGNOSIS:   AXIS I  Generalized Anxiety Disorder  AXIS II  Deferred  AXIS III See medical notes.  AXIS IV other psychosocial or environmental problems, problems related to social environment and problems with primary support group  AXIS V 61-70 mild symptoms     Assessment/Plan:  Pt appears frail, nervous and struggles to express her thoughts.  Her speech is staccato in style due to anxiety and trying to describe all past health problems and the present situation.  She is very circumstantial. She is very grief stricken about the death of her son age 58 a year ago Jan 2013. She is oriented despite her anxiety and feels better today. RECOMMENDATION:  1. Suggest Buspar, buspirone, 10 mg 3 X daily for anxiety 2. Suggest Cymbalta 30 mg daily for anxiety and pain 3. Would substitute aricept with Namenda 5 mg 2 X daily 4. Pt wishes to continue with community doctor when medically stable for discharge 5. Entertain the question of whether she can go to ALF or SNF.  6. No further psychiatric needs identified.  MD Psychiatrist signs off.  Kimberely Mccannon J. Ferol Luz, MD Psychiatrist.PBTD  07/20/2011 4:15 PM

## 2011-07-21 DIAGNOSIS — E871 Hypo-osmolality and hyponatremia: Secondary | ICD-10-CM

## 2011-07-21 DIAGNOSIS — E782 Mixed hyperlipidemia: Secondary | ICD-10-CM

## 2011-07-21 DIAGNOSIS — R51 Headache: Secondary | ICD-10-CM

## 2011-07-21 DIAGNOSIS — R339 Retention of urine, unspecified: Secondary | ICD-10-CM

## 2011-07-21 LAB — BASIC METABOLIC PANEL
Calcium: 9.2 mg/dL (ref 8.4–10.5)
GFR calc Af Amer: >90 mL/min (ref >90)
GFR calc non Af Amer: >90 mL/min (ref >90)
Glucose, Bld: 121 mg/dL — ABNORMAL HIGH (ref 70–99)
Sodium: 137 mEq/L (ref 135–145)

## 2011-07-21 LAB — GLUCOSE, CAPILLARY
Glucose-Capillary: 132 mg/dL — ABNORMAL HIGH (ref 70–99)
Glucose-Capillary: 134 mg/dL — ABNORMAL HIGH (ref 70–99)
Glucose-Capillary: 164 mg/dL — ABNORMAL HIGH (ref 70–99)

## 2011-07-21 LAB — CBC
Hemoglobin: 12.7 g/dL (ref 12.0–15.0)
MCH: 31.2 pg (ref 26.0–34.0)
MCHC: 35 g/dL (ref 30.0–36.0)
RDW: 12.1 % (ref 11.5–15.5)

## 2011-07-21 NOTE — ED Provider Notes (Signed)
Medical screening examination/treatment/procedure(s) were conducted as a shared visit with non-physician practitioner(s) and myself.  I personally evaluated the patient during the encounter  Cheri Guppy, MD 07/21/11 [phone]

## 2011-07-21 NOTE — Progress Notes (Signed)
Physical Therapy Treatment Patient Details Name: Ariana [name] MRN: [id number] DOB: February 02, 1930 Today's Date: 07/21/2011 Time: 4098-1191 PT Time Calculation (min): 33 min  PT Assessment / Plan / Recommendation Comments on Treatment Session  Pt adm with AMS.  Pt with much worse mobility today but this appears to be due more self limiting and perseveration on unrelated items.  Family plans to pay for more assistance at home.    Follow Up Recommendations  Supervision/Assistance - 24 hour;Home health PT    Barriers to Discharge        Equipment Recommendations  None recommended by PT    Recommendations for Other Services    Frequency Min 3X/week   Plan Discharge plan remains appropriate;Frequency remains appropriate    Precautions / Restrictions Precautions Precautions: Fall   Pertinent Vitals/Pain Lt jaw pain    Mobility  Bed Mobility Supine to Sit: 3: Mod assist Sitting - Scoot to Edge of Bed: 4: Min assist Sit to Supine: 3: Mod assist Details for Bed Mobility Assistance: Pt holding body in rigid position when trying to get up.  Pt flopped back into supine. Transfers Sit to Stand: 3: Mod assist;With upper extremity assist;From bed;From toilet Stand to Sit: 3: Mod assist;With upper extremity assist;To bed Details for Transfer Assistance: pt with rigidity in trunk. Ambulation/Gait Ambulation/Gait Assistance: 3: Mod assist Ambulation Distance (Feet): 10 Feet (10' x 2) Assistive device: 1 person hand held assist;Rolling walker Ambulation/Gait Assistance Details: Pt barely moving feet (~3") at a time.  Pt with feet pressed against each other.  Gait Pattern: Decreased step length - right;Decreased step length - left;Shuffle Gait velocity: Extremely slow <6" per minute    Exercises     PT Diagnosis:    PT Problem List:   PT Treatment Interventions:     PT Goals Acute Rehab PT Goals PT Goal: Supine/Side to Sit - Progress: Not progressing PT Goal: Sit to Stand - Progress:  Not progressing PT Transfer Goal: Bed to Chair/Chair to Bed - Progress: Not progressing PT Goal: Ambulate - Progress: Not progressing  Visit Information  Last PT Received On: 07/21/11 Assistance Needed: +2    Subjective Data  Subjective: Pt perseverating on the fact that her SaO2 monitor came disconnected after walking yesterday.  She said this at least 10 times.  I repeatedly reassured her that that was not a problem.   Cognition  Overall Cognitive Status: History of cognitive impairments - at baseline Arousal/Alertness: Awake/alert Orientation Level: Oriented X4 / Intact Behavior During Session: Anxious Cognition - Other Comments: Pt perseverating on pulse ox monitor coming loose yesterday.    Balance  Static Standing Balance Static Standing - Balance Support: Left upper extremity supported Static Standing - Level of Assistance: 4: Min assist  End of Session PT - End of Session Equipment Utilized During Treatment: Gait belt Activity Tolerance: Other (comment) (Pt self limiting) Patient left: in bed;with call bell/phone within reach;with bed alarm set Nurse Communication: Mobility status    Ariana [name] 07/21/2011, 2:56 PM  [facility] PT [phone]

## 2011-07-21 NOTE — Progress Notes (Signed)
PATIENT DETAILS Name: Ariana [name] Age: 78 y.o. Sex: female Date of Birth: [birth year] Admit Date: 07/18/2011 ZOX:[id number] M, MD, MD  Subjective: Very anxious No headache, no abdominal pain "Where is Dr Clarene Duke"  Objective: Vital signs in last 24 hours: Filed Vitals:   07/20/11 2327 07/21/11 0451 07/21/11 1038 07/21/11 1039  BP: 163/69 155/77 151/78 151/78  Pulse: 71 58 73   Temp: 97.9 F (36.6 C) 97.6 F (36.4 C)    TempSrc: Oral Oral    Resp: 16 18    Height:      Weight:      SpO2: 99% 99%      Weight change:   Body mass index is 21.36 kg/(m^2).  Intake/Output from previous day:  Intake/Output Summary (Last 24 hours) at 07/21/11 1044 Last data filed at 07/21/11 0600  Gross per 24 hour  Intake    530 ml  Output   3740 ml  Net  -3210 ml    PHYSICAL EXAM: Gen Exam: Awake and mostly alert with clear speech.  Neck: Supple, No JVD.   Chest: B/L Clear.No rales or rhonchi CVS: S1 S2 Regular, no murmurs.  Abdomen: soft, BS +, non tender, non distended.  Extremities: no edema, lower extremities warm to touch. Neurologic: Non Focal.   Skin: No Rash.   Wounds: N/A.    CONSULTS:  None  LAB RESULTS: CBC  Lab 07/21/11 0700 07/19/11 0610 07/18/11 1033 07/14/11 1312  WBC 6.9 6.9 6.7 6.8  HGB 12.7 11.5* 11.9* 12.7  HCT 36.3 32.0* 33.9* 38.0  PLT 206 214 232 247.0  MCV 89.2 87.4 87.6 94.0  MCH 31.2 31.4 30.7 --  MCHC 35.0 35.9 35.1 33.3  RDW 12.1 12.0 12.0 12.0  LYMPHSABS -- 1.8 1.3 1.6  MONOABS -- 0.7 0.5 0.4  EOSABS -- 0.0 0.0 0.1  BASOSABS -- 0.0 0.0 0.0  BANDABS -- -- -- --    Chemistries   Lab 07/21/11 0700 07/19/11 0610 07/18/11 1033 07/14/11 1312  NA 137 133* 129* 132*  K 3.8 3.3* 3.9 4.3  CL 102 101 92* 92*  CO2 24 21 24 30   GLUCOSE 121* 99 97 105*  BUN 5* 6 10 12   CREATININE 0.45* 0.56 0.58 0.6  CALCIUM 9.2 8.8 9.6 8.9  MG -- -- -- --    CBG:  Lab 07/21/11 0804 07/20/11 2133 07/20/11 1715 07/20/11 1204 07/20/11 0746  GLUCAP 132*  124* 127* 159* 101*    GFR Estimated Creatinine Clearance: 46.4 ml/min (by C-G formula based on Cr of 0.45).  Coagulation profile No results found for this basename: INR:5,PROTIME:5 in the last 168 hours  Cardiac Enzymes No results found for this basename: CK:3,CKMB:3,TROPONINI:3,MYOGLOBIN:3 in the last 168 hours  No components found with this basename: POCBNP:3 No results found for this basename: DDIMER:2 in the last 72 hours  Basename 07/18/11 2231  HGBA1C 5.7*    Basename 07/19/11 0610  CHOL 128  HDL 61  LDLCALC 56  TRIG 56  CHOLHDL 2.1  LDLDIRECT --    Basename 07/19/11 0610  TSH 1.163  T4TOTAL --  T3FREE 3.1  THYROIDAB --   No results found for this basename: VITAMINB12:2,FOLATE:2,FERRITIN:2,TIBC:2,IRON:2,RETICCTPCT:2 in the last 72 hours No results found for this basename: LIPASE:2,AMYLASE:2 in the last 72 hours  Urine Studies No results found for this basename: UACOL:2,UAPR:2,USPG:2,UPH:2,UTP:2,UGL:2,UKET:2,UBIL:2,UHGB:2,UNIT:2,UROB:2,ULEU:2,UEPI:2,UWBC:2,URBC:2,UBAC:2,CAST:2,CRYS:2,UCOM:2,BILUA:2 in the last 72 hours  MICROBIOLOGY: Recent Results (from the past 240 hour(s))  URINE CULTURE     Status: Normal   Collection Time  07/19/11 12:22 AM      Component Value Range Status Comment   Specimen Description URINE, CATHETERIZED   Final    Special Requests NONE   Final    Culture  Setup Time [id number]   Final    Colony Count NO GROWTH   Final    Culture NO GROWTH   Final    Report Status 07/20/2011 FINAL   Final     RADIOLOGY STUDIES/RESULTS: Ct Head Wo Contrast  07/18/2011  *RADIOLOGY REPORT*  Clinical Data: Headaches and altered level of consciousness.  CT HEAD WITHOUT CONTRAST  Technique:  Contiguous axial images were obtained from the base of the skull through the vertex without contrast.  Comparison: Head CT scan 02/11/2008.  Findings: There is cortical atrophy.  No evidence of acute abnormality including infarction, hemorrhage, mass lesion, mass  effect, midline shift or abnormal extra-axial fluid collection. Calvarium intact.  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  IMPRESSION: No acute finding.  Atrophy noted.  Original Report Authenticated By: Bernadene Bell. Maricela Curet, M.D.   Ct Abdomen Pelvis W Contrast  07/18/2011  *RADIOLOGY REPORT*  Clinical Data: 78 year-old with severe right lower quadrant pain and bloating.  CT ABDOMEN AND PELVIS WITH CONTRAST  Technique:  Multidetector CT imaging of the abdomen and pelvis was performed following the standard protocol during bolus administration of intravenous contrast.  Contrast: 80mL OMNIPAQUE IOHEXOL 300 MG/ML  SOLN  Comparison: None.  Findings: The lung bases are clear.  There is no evidence for free air.  The urinary bladder is markedly distended and could be the source of the patient's lower abdominal pain.  Uterus has been removed. There is a large amount stool in the distal colon and rectum. There is focal wall narrowing in the left transverse colon, best seen on sequence 2, image 52.  There is also focal wall thickening in the transverse colon on image 50.  The appendix is not confidently identified.  There is no significant free fluid or lymphadenopathy in the abdomen or pelvis.  There is mild fullness of the ureters bilaterally and most likely related to the urinary bladder distention.  There is a 0.9 cm low density exophytic structure in the left kidney which is suggestive for a cyst.  There appears to be additional cortical cysts in the left kidney.  There is fullness of the central intrahepatic bile ducts and the common bile duct measures up to 0.9 cm.  The biliary findings are most likely associated with the cholecystectomy. Portal venous system is patent.  No gross abnormality to the spleen, adrenal and pancreas.  Inadvertently, the entire chest was imaged during the delayed renal images.  There is enlargement of the left thyroid lobe and likely related to a nodule.   This nodule could measures up to 2.7 cm. There is no significant pericardial or pleural fluid.  Ascending thoracic aorta is slightly prominent measuring up to 3.4 cm.  There is no significant chest lymphadenopathy.  The trachea and mainstem bronchi are patent.  The lungs are clear.  No evidence for airspace disease.  There may be scarring near the base the lingula.  Disc space loss at L5-S1.  IMPRESSION: There is marked distention of the urinary bladder and could represent urinary retention.  Two segments of wall thickening in the transverse colon.  Findings could be associated with peristalsis or inflammation but neoplasm cannot be excluded.  Recommend correlation with prior colonoscopy or further evaluation with colonoscopy.  Evidence for colonic  diverticulosis.  Large amount of stool in the distal colon and rectum.  Enlargement of the left thyroid lobe.  Recommend further characterization with ultrasound.  Original Report Authenticated By: Richarda Overlie, M.D.   Dg Chest Port 1 View  07/18/2011  *RADIOLOGY REPORT*  Clinical Data: Shortness of breath, abdominal pain/discomfort, evaluate for pneumonia  PORTABLE CHEST - 1 VIEW  Comparison: 04/08/2011; 01/09/2008  Findings: Grossly unchanged cardiac silhouette and mediastinal contours.  The lungs remain hyperinflated with grossly unchanged linear heterogeneous opacities within the left lower lung, favored to represent subsegmental atelectasis or scar.  There is grossly unchanged mild diffuse thickening of the central airways.  No focal airspace opacities.  No definite pleural effusion or pneumothorax. Unchanged bones.  IMPRESSION: Stable findings of emphysema/bronchitic change without acute cardiopulmonary disease.  Original Report Authenticated By: Waynard Reeds, M.D.    MEDICATIONS: Scheduled Meds:    . aspirin EC  81 mg Oral Daily  . busPIRone  10 mg Oral TID  . cefTRIAXone (ROCEPHIN)  IV  1 g Intravenous Q24H  . cycloSPORINE  1 drop Both Eyes Q12H  .  digoxin  125 mcg Oral Daily  . diltiazem  180 mg Oral Daily  . donepezil  5 mg Oral QHS  . DULoxetine  30 mg Oral Daily  . enoxaparin (LOVENOX) injection  40 mg Subcutaneous Daily  . feeding supplement  1 Container Oral TID BM  . folic acid  2 mg Oral Daily  . gabapentin  300 mg Oral TID  . hydroxychloroquine  200 mg Oral Daily  . loteprednol  1 drop Both Eyes BID  . magic mouthwash w/lidocaine  5 mL Oral QID  . metoprolol  25 mg Oral BID  . multivitamin with minerals  1 tablet Oral Daily  . pantoprazole  40 mg Oral Daily  . pilocarpine  5 mg Oral BID  . polyvinyl alcohol  1 drop Both Eyes QID   Continuous Infusions:    . sodium chloride 20 mL/hr (07/20/11 1414)   PRN Meds:.acetaminophen, ALPRAZolam, cyclobenzaprine, HYDROcodone-acetaminophen, senna-docusate  Antibiotics: Anti-infectives     Start     Dose/Rate Route Frequency Ordered Stop   07/19/11 1000   hydroxychloroquine (PLAQUENIL) tablet 200 mg        200 mg Oral Daily 07/18/11 2208     07/19/11 1000   cefTRIAXone (ROCEPHIN) 1 g in dextrose 5 % 50 mL IVPB        1 g 100 mL/hr over 30 Minutes Intravenous Every 24 hours 07/18/11 2208     07/18/11 1245   cefTRIAXone (ROCEPHIN) 1 g in dextrose 5 % 50 mL IVPB        1 g 100 mL/hr over 30 Minutes Intravenous  Once 07/18/11 1233 07/18/11 1332          Assessment/Plan: Principal Problem: Altered Mental Status -more alert now and seems to be back to usual baseline, but still very anxious -slight confusion from baseline on admission-per patient's daughter-07/19/11 -Likely toxic-metabolic encephalopathy   *UTI (lower urinary tract infection) -Febrile on 6/5-no fever for past 48 hours -urine culture showing no growth-since febrile on 6/5-will continue with Rocephin empircally  Urinary Obstruction -continue with foley catheter-spoke with Dr Ottelin-case/history/labs/radiology discussed, he suggests to leave foley in for a 2-3 weeks, and have patient follow up with him  on discharge -continue with Rocephin  Headache - resolved today -neck very supple -no meningeal signs -ESR-16-doubt Giant cell arteritis -CT head-no acute abnormalities -did have neck pain/back pain-abdominal pain  as well  Oral Pain -likely 2/2 to dry mouth from Sjogren's -chronic issue -prn magic mouthwash with lidocaine  Hyponatremia -resolved -likely 2/2 to dehydration  Hypokalemia -replete K-now back to normal  Neuropathy -continue with Neurontin, but will increase to 300 mg TID  Anxiety with Depression -was on zoloft till recently-but caused excessive sleepiness-therefore stopped -continue with Xanax-apparently intolerant to Klonopin -spoke with Psych MD-Dr Jonna Munro start Buspar and Cymbalta  Sjogren's Syndrome -continue with Pilocarpine/Plaquenil -follow's up with Dr Terald Sleeper as outpatient  HTN -controlled with Lopressor/Cardizem  MVP -follow up with Cards (Dr Clarene Duke) as outpatient-however patient wanting to speak with Dr Georgiana Spinner called [facility]  GERD -PPI  Dysphagia -Dysphagia 1 diet-appreciate speech eval  Dementia - on Aricept-given urinary obstruction-will d/c-however  -start Namenda  Disposition: Remain inpatient-Home with home health services on d/c  DVT Prophylaxis: Prophylactic Lovenox  Code Status: Full Code  Family Communication -spoke to Daughter-Earle [name]-[phone] on 6/5 and 6/7-updated  Jeoffrey Massed, MD  [facility] Pager [phone]  If 7PM-7AM, please contact night-coverage [email or web address] Password [email or web address] 07/21/2011, 10:44 AM   LOS: 3 days

## 2011-07-21 NOTE — Progress Notes (Signed)
[facility] Social Visit  The patient is an 78yo female with a PMH of palpitations, chest pain for 15 years, bradycardia and complete heart block, which were medication and hyperkalemia induced, chronic back pain, LEE, HTN.  She had a left heart cath in 2005 which revealed no coronary artery disease and normal LV function.  Her most recent office visit to see Dr. Clarene Duke was January 2013 at which time no changes were made to her medical therapy.  She presented with a CC of pain.  She is being treated for UTI.  Subjective: Complains of HA, jaw pain, left-sided neck pain, chest pain, abdominal pain, and her toes drawing up.  She repeatedly talked about some man who came into her room.  Objective: Vital signs in last 24 hours: Temp:  [97.4 F (36.3 C)-98.4 F (36.9 C)] 97.5 F (36.4 C) (06/07 1435) Pulse Rate:  [58-81] 76  (06/07 1435) Resp:  [16-18] 18  (06/07 1435) BP: (151-169)/(69-81) 169/77 mmHg (06/07 1435) SpO2:  [97 %-100 %] 97 % (06/07 1435) Last BM Date: 07/21/11  Intake/Output from previous day: 06/06 0701 - 06/07 0700 In: 890 [P.O.:360; I.V.:480; IV Piggyback:50] Out: 3740 [Urine:3740] Intake/Output this shift: Total I/O In: 1160 [P.O.:1160] Out: 850 [Urine:850]  Medications Current Facility-Administered Medications  Medication Dose Route Frequency Provider Last Rate Last Dose  . 0.9 %  sodium chloride infusion   Intravenous Continuous Maretta Bees, MD 20 mL/hr at 07/20/11 1414 20 mL/hr at 07/20/11 1414  . acetaminophen (TYLENOL) tablet 650 mg  650 mg Oral Q6H PRN Maretta Bees, MD   650 mg at 07/19/11 1107  . ALPRAZolam Prudy Feeler) tablet 0.5 mg  0.5 mg Oral BID PRN Eduard Clos, MD   0.5 mg at 07/20/11 0035  . aspirin EC tablet 81 mg  81 mg Oral Daily Eduard Clos, MD   81 mg at 07/21/11 1039  . busPIRone (BUSPAR) tablet 10 mg  10 mg Oral TID Maretta Bees, MD   10 mg at 07/21/11 1525  . cefTRIAXone (ROCEPHIN) 1 g  in dextrose 5 % 50 mL IVPB  1 g Intravenous Q24H Eduard Clos, MD   1 g at 07/21/11 1036  . cyclobenzaprine (FLEXERIL) tablet 5 mg  5 mg Oral TID PRN Eduard Clos, MD   5 mg at 07/19/11 2039  . cycloSPORINE (RESTASIS) 0.05 % ophthalmic emulsion 1 drop  1 drop Both Eyes Q12H Eduard Clos, MD   1 drop at 07/21/11 1042  . digoxin (LANOXIN) tablet 125 mcg  125 mcg Oral Daily Eduard Clos, MD   125 mcg at 07/21/11 1040  . diltiazem (CARDIZEM CD) 24 hr capsule 180 mg  180 mg Oral Daily Eduard Clos, MD   180 mg at 07/21/11 1039  . donepezil (ARICEPT) tablet 5 mg  5 mg Oral QHS Eduard Clos, MD   5 mg at 07/20/11 2207  . DULoxetine (CYMBALTA) DR capsule 30 mg  30 mg Oral Daily Maretta Bees, MD   30 mg at 07/21/11 1042  . enoxaparin (LOVENOX) injection 40 mg  40 mg Subcutaneous Daily Maretta Bees, MD   40 mg at 07/21/11 1041  . feeding supplement (ENSURE) pudding 1 Container  1 Container Oral TID BM Tonye Becket, RD   1 Container at 07/21/11 1000  . folic acid (FOLVITE) tablet 2 mg  2 mg Oral Daily Eduard Clos, MD   2 mg at 07/21/11  1037  . gabapentin (NEURONTIN) capsule 300 mg  300 mg Oral TID Maretta Bees, MD   300 mg at 07/21/11 1525  . HYDROcodone-acetaminophen (NORCO) 5-325 MG per tablet 1 tablet  1 tablet Oral Q6H PRN Eduard Clos, MD   1 tablet at 07/21/11 1149  . hydroxychloroquine (PLAQUENIL) tablet 200 mg  200 mg Oral Daily Eduard Clos, MD   200 mg at 07/21/11 1040  . loteprednol (LOTEMAX) 0.5 % ophthalmic suspension 1 drop  1 drop Both Eyes BID Eduard Clos, MD   1 drop at 07/21/11 1038  . magic mouthwash w/lidocaine  5 mL Oral QID Maretta Bees, MD   5 mL at 07/21/11 1334  . metoprolol tartrate (LOPRESSOR) tablet 25 mg  25 mg Oral BID Eduard Clos, MD   25 mg at 07/21/11 1038  . mulitivitamin with minerals tablet 1 tablet  1 tablet Oral Daily Eduard Clos, MD   1 tablet at 07/21/11 1040    . pantoprazole (PROTONIX) EC tablet 40 mg  40 mg Oral Daily Eduard Clos, MD   40 mg at 07/21/11 1039  . pilocarpine (SALAGEN) tablet 5 mg  5 mg Oral BID Eduard Clos, MD   5 mg at 07/21/11 1040  . polyvinyl alcohol (LIQUIFILM TEARS) 1.4 % ophthalmic solution 1 drop  1 drop Both Eyes QID Maretta Bees, MD   1 drop at 07/21/11 1334  . senna-docusate (Senokot-S) tablet 1 tablet  1 tablet Oral QHS PRN Eduard Clos, MD        PE: General appearance: alert, cooperative and Extremely anxious. HEENT: PERRLA, EOMI Lungs: clear to auscultation bilaterally, no Wheeze or rales. Heart: regular rate and rhythm, S1, S2 normal, no murmur, click, rub or gallop Abdomen: hyeractive bowel sounds. very tender right side. Extremities: No LEE, toes are hyper extended. Pulses: 2+ and symmetric Skin: Skin is very dry. Neurologic: Grossly normal  Lab Results:   Basename 07/21/11 0700 07/19/11 0610  WBC 6.9 6.9  HGB 12.7 11.5*  HCT 36.3 32.0*  PLT 206 214   BMET  Basename 07/21/11 0700 07/19/11 0610  NA 137 133*  K 3.8 3.3*  CL 102 101  CO2 24 21  GLUCOSE 121* 99  BUN 5* 6  CREATININE 0.45* 0.56  CALCIUM 9.2 8.8   PT/INR No results found for this basename: LABPROT:3,INR:3 in the last 72 hours Cholesterol  Basename 07/19/11 0610  CHOL 128   Cardiac Enzymes No components found with this basename: TROPONIN:3, CKMB:3  Studies/Results: CT HEAD WITHOUT CONTRAST  Technique: Contiguous axial images were obtained from the base of  the skull through the vertex without contrast.  Comparison: Head CT scan 02/11/2008.  Findings: There is cortical atrophy. No evidence of acute  abnormality including infarction, hemorrhage, mass lesion, mass  effect, midline shift or abnormal extra-axial fluid collection.  Calvarium intact. Hypoplasia of the right maxillary sinus and  postoperative change noted.  IMPRESSION:  No acute finding. Atrophy noted.  CT ABDOMEN AND PELVIS WITH  CONTRAST  Technique: Multidetector CT imaging of the abdomen and pelvis was  performed following the standard protocol during bolus  administration of intravenous contrast.  Contrast: 80mL OMNIPAQUE IOHEXOL 300 MG/ML SOLN  Comparison: None.  Findings: The lung bases are clear. There is no evidence for free  air.  The urinary bladder is markedly distended and could be the source  of the patient's lower abdominal pain. Uterus has been removed.  There is a  large amount stool in the distal colon and rectum.  There is focal wall narrowing in the left transverse colon, best  seen on sequence 2, image 52. There is also focal wall thickening  in the transverse colon on image 50. The appendix is not  confidently identified. There is no significant free fluid or  lymphadenopathy in the abdomen or pelvis.  There is mild fullness of the ureters bilaterally and most likely  related to the urinary bladder distention. There is a 0.9 cm low  density exophytic structure in the left kidney which is suggestive  for a cyst. There appears to be additional cortical cysts in the  left kidney. There is fullness of the central intrahepatic bile  ducts and the common bile duct measures up to 0.9 cm. The biliary  findings are most likely associated with the cholecystectomy.  Portal venous system is patent. No gross abnormality to the  spleen, adrenal and pancreas.  Inadvertently, the entire chest was imaged during the delayed renal  images. There is enlargement of the left thyroid lobe and likely  related to a nodule. This nodule could measures up to 2.7 cm.  There is no significant pericardial or pleural fluid. Ascending  thoracic aorta is slightly prominent measuring up to 3.4 cm. There  is no significant chest lymphadenopathy. The trachea and mainstem  bronchi are patent. The lungs are clear. No evidence for airspace  disease. There may be scarring near the base the lingula. Disc  space loss at L5-S1.    IMPRESSION:  There is marked distention of the urinary bladder and could  represent urinary retention.  Two segments of wall thickening in the transverse colon. Findings  could be associated with peristalsis or inflammation but neoplasm  cannot be excluded. Recommend correlation with prior colonoscopy  or further evaluation with colonoscopy.  Evidence for colonic diverticulosis.  Large amount of stool in the distal colon and rectum.  Enlargement of the left thyroid lobe. Recommend further  characterization with ultrasound.  Assessment/Plan    Principal Problem:  *UTI (lower urinary tract infection) Active Problems:  HYPERTENSION, BENIGN  SJOGREN'S SYNDROME  Urinary retention  Hyponatremia  Generalized anxiety disorder  Plan: Per Dr. Jerral Ralph   LOS: 3 days    Ariana [name] 07/21/2011 4:15 PM

## 2011-07-22 DIAGNOSIS — E871 Hypo-osmolality and hyponatremia: Secondary | ICD-10-CM

## 2011-07-22 DIAGNOSIS — E782 Mixed hyperlipidemia: Secondary | ICD-10-CM

## 2011-07-22 DIAGNOSIS — R51 Headache: Secondary | ICD-10-CM

## 2011-07-22 DIAGNOSIS — R339 Retention of urine, unspecified: Secondary | ICD-10-CM

## 2011-07-22 LAB — GLUCOSE, CAPILLARY: Glucose-Capillary: 116 mg/dL — ABNORMAL HIGH (ref 70–99)

## 2011-07-22 MED ORDER — BUSPIRONE HCL 10 MG PO TABS: 10.0000 mg | ORAL_TABLET | Freq: Three times a day (TID) | ORAL | Status: DC

## 2011-07-22 MED ORDER — MEMANTINE HCL 5 MG PO TABS: 5.0000 mg | ORAL_TABLET | Freq: Two times a day (BID) | ORAL | Status: DC

## 2011-07-22 MED ORDER — DULOXETINE HCL 30 MG PO CPEP: 30.0000 mg | ORAL_CAPSULE | Freq: Every day | ORAL | Status: DC

## 2011-07-22 MED ORDER — GABAPENTIN 100 MG PO CAPS: 300.0000 mg | ORAL_CAPSULE | Freq: Three times a day (TID) | ORAL | Status: DC

## 2011-07-22 MED ORDER — CEFUROXIME AXETIL 500 MG PO TABS: 500.0000 mg | ORAL_TABLET | Freq: Two times a day (BID) | ORAL | Status: DC

## 2011-07-22 MED ORDER — INSULIN ASPART 100 UNIT/ML ~~LOC~~ SOLN: 0.0000 [IU] | Freq: Every day | SUBCUTANEOUS | Status: DC

## 2011-07-22 MED ORDER — INSULIN ASPART 100 UNIT/ML ~~LOC~~ SOLN: 0.0000 [IU] | SUBCUTANEOUS | Status: DC

## 2011-07-22 MED ORDER — ONDANSETRON HCL 4 MG/2ML IJ SOLN
4.0000 mg | Freq: Four times a day (QID) | INTRAMUSCULAR | Status: DC | PRN
  Administered 2011-07-22: 4 mg via INTRAVENOUS
  Filled 2011-07-22: qty 2

## 2011-07-22 MED ORDER — MEMANTINE HCL 5 MG PO TABS
5.0000 mg | ORAL_TABLET | ORAL | Status: DC
  Administered 2011-07-22 – 2011-07-25 (×7): 5 mg via ORAL
  Filled 2011-07-22 (×8): qty 1

## 2011-07-22 MED ORDER — HYDROCODONE-ACETAMINOPHEN 5-325 MG PO TABS
1.0000 | ORAL_TABLET | Freq: Four times a day (QID) | ORAL | Status: DC | PRN
  Administered 2011-07-22 – 2011-07-23 (×3): 2 via ORAL
  Filled 2011-07-22 (×3): qty 2

## 2011-07-22 MED ORDER — INSULIN ASPART 100 UNIT/ML ~~LOC~~ SOLN
0.0000 [IU] | Freq: Three times a day (TID) | SUBCUTANEOUS | Status: DC
  Administered 2011-07-22: 3 [IU] via SUBCUTANEOUS
  Administered 2011-07-22 – 2011-07-24 (×6): 2 [IU] via SUBCUTANEOUS
  Administered 2011-07-25: 3 [IU] via SUBCUTANEOUS

## 2011-07-22 NOTE — Progress Notes (Signed)
   CARE MANAGEMENT NOTE 07/22/2011  Patient:  Ariana [name], Ariana [name]   Account Number:  [id number]  Date Initiated:  07/19/2011  Documentation initiated by:  Letha Cape  Subjective/Objective Assessment:   dx uti  admit- lives alone     Action/Plan:   pt/st eval- rec hh with 24 hr vs snf   Anticipated DC Date:  07/24/2011   Anticipated DC Plan:  HOME W HOME HEALTH SERVICES  In-house referral  Clinical Social Worker      DC Associate Professor  CM consult      [name] Choice  HOME HEALTH   Choice offered to / List presented to:  C-4 Adult Children        HH arranged  HH-1 RN  HH-2 PT  HH-3 OT      [facility] agency  [facility]   Status of service:  Completed, signed off Medicare Important Message given?   (If response is "NO", the following Medicare IM given date fields will be blank) Date Medicare IM given:   Date Additional Medicare IM given:    Discharge Disposition:  SKILLED NURSING FACILITY  Per UR Regulation:  Reviewed for med. necessity/level of care/duration of stay  If discussed at Long Length of Stay Meetings, dates discussed:    Comments:  07/22/2011 1130 Spoke to pt and gave permission to speak daughter Ariana [name], #[phone]. Call to daughter.  States she really believes SNF will benefit her mother. Will have meeting with PCP today at 2:00 pm with PCP and SW. Made SW aware that daughter is requesting SNF. SW plans to meet with daughter today. Isidoro Donning RN CCM Case Mgmt phone [phone]  07/20/11 15:03 Letha Cape RN, BSN [phone] per physical therapy recs snf vs hhpt with 24 hr care. Patient states she wants to go home with Home Health,  her daughter Ariana [name] called while I was in the room speaking with patient and she suggested to go with [facility] after I gave her the list over the phone.  Patient also has an aide with [employer] [phone] who will also be able to increase the amount of hours she can be with patient  during the day, she is already there from 11-4pm  ( they pay privately for this).  07/19/11 3:46 Letha Cape RN, BSN [phone] patient lives alone, she has an aide with [employer] from 11-4 Mon-Friday.  Await pt eval.  NCM will continue to follow for dc needs.  Patient has medication coverage , she states she can transport via car.

## 2011-07-22 NOTE — Discharge Summary (Addendum)
PATIENT DETAILS Name: Ariana [name] Age: 78 y.o. Sex: female Date of Birth: 04-08-1929 MRN: [id number]. Admit Date: 07/18/2011 Admitting Physician: Eduard Clos, MD WGN:[id number],YQMVH M, MD, MD  PRIMARY DISCHARGE DIAGNOSIS:  Principal Problem:  *UTI (lower urinary tract infection) Active Problems:  Urinary obstruction  HYPERTENSION, BENIGN  SJOGREN'S SYNDROME  Urinary retention  Hyponatremia  Generalized anxiety disorder  Dementia     PAST MEDICAL HISTORY: Past Medical History  Diagnosis Date  . Sjogren's syndrome   . Dry eye syndrome   . Hypertension, benign   . Mitral valve prolapse   . GERD (gastroesophageal reflux disease)   . Diverticulosis of colon   . Irritable bowel syndrome   . Urinary incontinence   . Low back pain syndrome   . Fibromyalgia   . Memory loss   . Anxiety and depression   . History of adverse drug reaction   . Peripheral neuropathy     "both feet and legs"  . Shortness of breath 07/18/11    "alot lately"  . Anginal pain   . History of recurrent UTIs   . H/O hiatal hernia   . Anxiety   . Dementia   . Depression     DISCHARGE MEDICATIONS: Medication List  As of 07/25/2011 10:02 AM   STOP taking these medications         dicyclomine 20 MG tablet      donepezil 10 MG tablet      furosemide 20 MG tablet         TAKE these medications         ALPRAZolam 0.5 MG tablet   Commonly known as: XANAX   Take 1 tablet (0.5 mg total) by mouth 2 (two) times daily as needed. For anxiety      aspirin 81 MG tablet   Take 81 mg by mouth daily.      busPIRone 10 MG tablet   Commonly known as: BUSPAR   Take 1 tablet (10 mg total) by mouth 3 (three) times daily.      CALTRATE 600+D 600-400 MG-UNIT per tablet   Generic drug: Calcium Carbonate-Vitamin D   Take 2 tablets by mouth daily.      cyclobenzaprine 10 MG tablet   Commonly known as: FLEXERIL   Take 1/2 to 1 tablet by mouth 4 times daily as needed for msucle spasm      cycloSPORINE  0.05 % ophthalmic emulsion   Commonly known as: RESTASIS   Place 1 drop into both eyes every 12 (twelve) hours.      digoxin 0.125 MG tablet   Commonly known as: LANOXIN   Take 125 mcg by mouth daily.      diltiazem 180 MG 24 hr capsule   Commonly known as: CARDIZEM CD   Take 1 capsule (180 mg total) by mouth daily.      DULoxetine 20 MG capsule   Commonly known as: CYMBALTA   Take 2 capsules (40 mg total) by mouth daily.      estrogens (conjugated) 0.9 MG tablet   Commonly known as: PREMARIN   Take 0.9 mg by mouth daily. Take daily for 21 days then do not take for 7 days.      feeding supplement Pudg   Take 1 Container by mouth 3 (three) times daily between meals.      Fish Oil 1000 MG Caps   Take 1 capsule by mouth daily.      folic acid 1 MG tablet  Commonly known as: FOLVITE   Take 2 mg by mouth daily.      gabapentin 100 MG capsule   Commonly known as: NEURONTIN   Take 3 capsules (300 mg total) by mouth 3 (three) times daily.      HYDROcodone-acetaminophen 5-500 MG per tablet   Commonly known as: VICODIN   Take 1 tablet by mouth three times daily as needed for pain--DO NOT EXCEED 3 PER DAY.      hydroxychloroquine 200 MG tablet   Commonly known as: PLAQUENIL   Take 200 mg by mouth daily. As directed by Dr. Corliss Skains      hydroxypropyl methylcellulose 2.5 % ophthalmic solution   Commonly known as: ISOPTO TEARS   Place 1 drop into both eyes 4 (four) times daily.      insulin aspart 100 UNIT/ML injection   Commonly known as: novoLOG   0-15 Units, Subcutaneous, 3 times daily with meals  CBG < 70: implement hypoglycemia protocol  CBG 70 - 120: 0 units  CBG 121 - 150: 2 units  CBG 151 - 200: 3 units  CBG 201 - 250: 5 units  CBG 251 - 300: 8 units  CBG 301 - 350: 11 units  CBG 351 - 400: 15 units      lansoprazole 30 MG capsule   Commonly known as: PREVACID   Take 1 capsule (30 mg total) by mouth daily.      loteprednol 0.5 % ophthalmic suspension     Commonly known as: LOTEMAX   Place 1 drop into both eyes 2 (two) times daily.      magic mouthwash w/lidocaine Soln   Take 5 mLs by mouth 4 (four) times daily as needed.      memantine 5 MG tablet   Commonly known as: NAMENDA   Take 1 tablet (5 mg total) by mouth 2 (two) times daily.      metoprolol 50 MG tablet   Commonly known as: LOPRESSOR   Take 25 mg by mouth 2 (two) times daily.      multivitamin tablet   Take 1 tablet by mouth daily.      pilocarpine 5 MG tablet   Commonly known as: SALAGEN   Take 5 mg by mouth 2 (two) times daily. As directed by Dr. Corliss Skains      polyethylene glycol packet   Commonly known as: MIRALAX / GLYCOLAX   Take 17 g by mouth daily.      REFRESH TEARS 0.5 % Soln   Generic drug: carboxymethylcellulose   1 drop 2 (two) times daily.      sennosides-docusate sodium 8.6-50 MG tablet   Commonly known as: SENOKOT-S   1-2 tabs by mouth at bedtime             BRIEF HPI:  See H&P, Labs, Consult and Test reports for all details in brief,78 year-old female with known history of Sjgren syndrome, MVP, Dementia, hypertension was taken to the PCP last Friday,  for a routine checkup at that time patient was having increasing urinary frequency and a urinalysis was done. On the day of admission,her PCPs office called her that they were calling in medications for UTI. At that time over the phone patient complained of abdominal pain and headache. Patient was instructed to go to the ER. In the ER patient had a CT abdomen pelvis which showed abdominal distention which is compatible with urine retention. Patient had a Foley placed and 600 cc of fluid drained.She was then  admitted for further evaluation and treatment.  CONSULTATIONS:   cardiology, Neurology and psychiatry  PERTINENT RADIOLOGIC STUDIES: Ct Head Wo Contrast  07/18/2011  *RADIOLOGY REPORT*  Clinical Data: Headaches and altered level of consciousness.  CT HEAD WITHOUT CONTRAST  Technique:   Contiguous axial images were obtained from the base of the skull through the vertex without contrast.  Comparison: Head CT scan 02/11/2008.  Findings: There is cortical atrophy.  No evidence of acute abnormality including infarction, hemorrhage, mass lesion, mass effect, midline shift or abnormal extra-axial fluid collection. Calvarium intact.  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  IMPRESSION: No acute finding.  Atrophy noted.  Original Report Authenticated By: Bernadene Bell. Maricela Curet, M.D.   Ct Abdomen Pelvis W Contrast  07/18/2011  *RADIOLOGY REPORT*  Clinical Data: 78 year-old with severe right lower quadrant pain and bloating.  CT ABDOMEN AND PELVIS WITH CONTRAST  Technique:  Multidetector CT imaging of the abdomen and pelvis was performed following the standard protocol during bolus administration of intravenous contrast.  Contrast: 80mL OMNIPAQUE IOHEXOL 300 MG/ML  SOLN  Comparison: None.  Findings: The lung bases are clear.  There is no evidence for free air.  The urinary bladder is markedly distended and could be the source of the patient's lower abdominal pain.  Uterus has been removed. There is a large amount stool in the distal colon and rectum. There is focal wall narrowing in the left transverse colon, best seen on sequence 2, image 52.  There is also focal wall thickening in the transverse colon on image 50.  The appendix is not confidently identified.  There is no significant free fluid or lymphadenopathy in the abdomen or pelvis.  There is mild fullness of the ureters bilaterally and most likely related to the urinary bladder distention.  There is a 0.9 cm low density exophytic structure in the left kidney which is suggestive for a cyst.  There appears to be additional cortical cysts in the left kidney.  There is fullness of the central intrahepatic bile ducts and the common bile duct measures up to 0.9 cm.  The biliary findings are most likely associated with the  cholecystectomy. Portal venous system is patent.  No gross abnormality to the spleen, adrenal and pancreas.  Inadvertently, the entire chest was imaged during the delayed renal images.  There is enlargement of the left thyroid lobe and likely related to a nodule.  This nodule could measures up to 2.7 cm. There is no significant pericardial or pleural fluid.  Ascending thoracic aorta is slightly prominent measuring up to 3.4 cm.  There is no significant chest lymphadenopathy.  The trachea and mainstem bronchi are patent.  The lungs are clear.  No evidence for airspace disease.  There may be scarring near the base the lingula.  Disc space loss at L5-S1.  IMPRESSION: There is marked distention of the urinary bladder and could represent urinary retention.  Two segments of wall thickening in the transverse colon.  Findings could be associated with peristalsis or inflammation but neoplasm cannot be excluded.  Recommend correlation with prior colonoscopy or further evaluation with colonoscopy.  Evidence for colonic diverticulosis.  Large amount of stool in the distal colon and rectum.  Enlargement of the left thyroid lobe.  Recommend further characterization with ultrasound.  Original Report Authenticated By: Richarda Overlie, M.D.   Dg Chest Port 1 View  07/18/2011  *RADIOLOGY REPORT*  Clinical Data: Shortness of breath, abdominal pain/discomfort, evaluate for pneumonia  PORTABLE CHEST -  1 VIEW  Comparison: 04/08/2011; 01/09/2008  Findings: Grossly unchanged cardiac silhouette and mediastinal contours.  The lungs remain hyperinflated with grossly unchanged linear heterogeneous opacities within the left lower lung, favored to represent subsegmental atelectasis or scar.  There is grossly unchanged mild diffuse thickening of the central airways.  No focal airspace opacities.  No definite pleural effusion or pneumothorax. Unchanged bones.  IMPRESSION: Stable findings of emphysema/bronchitic change without acute cardiopulmonary  disease.  Original Report Authenticated By: Waynard Reeds, M.D.     PERTINENT LAB RESULTS: CBC: No results found for this basename: WBC:2,HGB:2,HCT:2,PLT:2 in the last 72 hours CMET CMP     Component Value Date/Time   NA 137 07/21/2011 0700   K 3.8 07/21/2011 0700   CL 102 07/21/2011 0700   CO2 24 07/21/2011 0700   GLUCOSE 121* 07/21/2011 0700   BUN 5* 07/21/2011 0700   CREATININE 0.45* 07/21/2011 0700   CALCIUM 9.2 07/21/2011 0700   PROT 6.7 07/19/2011 0610   ALBUMIN 3.5 07/19/2011 0610   AST 26 07/19/2011 0610   ALT 11 07/19/2011 0610   ALKPHOS 52 07/19/2011 0610   BILITOT 0.6 07/19/2011 0610   GFRNONAA >90 07/21/2011 0700   GFRAA >90 07/21/2011 0700    GFR Estimated Creatinine Clearance: 46.4 ml/min (by C-G formula based on Cr of 0.45). No results found for this basename: LIPASE:2,AMYLASE:2 in the last 72 hours No results found for this basename: CKTOTAL:3,CKMB:3,CKMBINDEX:3,TROPONINI:3 in the last 72 hours No components found with this basename: POCBNP:3 No results found for this basename: DDIMER:2 in the last 72 hours No results found for this basename: HGBA1C:2 in the last 72 hours No results found for this basename: CHOL:2,HDL:2,LDLCALC:2,TRIG:2,CHOLHDL:2,LDLDIRECT:2 in the last 72 hours No results found for this basename: TSH,T4TOTAL,FREET3,T3FREE,THYROIDAB in the last 72 hours No results found for this basename: VITAMINB12:2,FOLATE:2,FERRITIN:2,TIBC:2,IRON:2,RETICCTPCT:2 in the last 72 hours Coags: No results found for this basename: PT:2,INR:2 in the last 72 hours Microbiology: Recent Results (from the past 240 hour(s))  URINE CULTURE     Status: Normal   Collection Time   07/19/11 12:22 AM      Component Value Range Status Comment   Specimen Description URINE, CATHETERIZED   Final    Special Requests NONE   Final    Culture  Setup Time [id number]   Final    Colony Count NO GROWTH   Final    Culture NO GROWTH   Final    Report Status 07/20/2011 FINAL   Final      BRIEF HOSPITAL  COURSE:  Altered Mental Status  -this was on admission-likely toxic metabolic encephalopathy-this has now resolved and mentation is back to baseline. Does have underlying Dementia, was just started on Aricept prior to admit, given urinary retention-will swtich to Namenda.   *UTI (lower urinary tract infection)  -Had one Febrile episode on 6/5-no fever for past 72 hours now -urine culture showing no growth-since febrile on 6/5-started on Rocephin empircally on admit-given urinary retention and UTI/AMS-was then transitioned to Ceftin, she will not be on any further antibiotic therapy on  Discharge  Urinary Obstruction  -continue with foley catheter-spoke with Dr Ottelin-case/history/labs/radiology discussed, he suggests to leave foley in for a 2-3 weeks, and have patient follow up with him on discharge  -outpatient urology appt made for 6/18  -CT scan of abdomen did not show a obvious cause for the obstruction, will defer to Urology for further management  Headache  - mild and only intermittent -seen by Neuro with no further  recommendations -neck very supple  -no meningeal signs  -ESR-16-doubt Giant cell arteritis  -CT head-no acute abnormalities  -did have neck pain/back pain-abdominal pain as well  -has neuropathic pain-started on cymbalta, have increased dose of neurontin   Oral Pain  -likely 2/2 to dry mouth from Sjogren's  -as needed magic mouthwash -chronic issue -follow up with PCP and Primary Rheum (Dr Terald Sleeper)  Hyponatremia  -resolved  -likely 2/2 to dehydration   Hypokalemia  -replete K-now back to normal   Neuropathy  -continue with Neurontin, but increased to 300 mg TID   Anxiety with Depression  -significantly calmer today. -was on zoloft till recently-but caused excessive sleepiness-therefore stopped  -continue with Xanax-apparently intolerant to Klonopin  -per Psych MD-Dr Bogard-to start Buspar and Cymbalta  -long discussion with daughter 6/7-patient will  need to get re-established with a psych MD for further optimization of her meds. She claimed understanding.  Sjogren's Syndrome  -continue with Pilocarpine/Plaquenil  -follow's up with Dr Terald Sleeper as outpatient   HTN  -controlled with Lopressor/Cardizem   MVP  -follow up with Cards (Dr Clarene Duke) as outpatient-however patient wanting to speak with Dr Maryruth Eve by [facility] this admit   GERD  -PPI   Dysphagia  -secondary to oral issues-continue with puree diet-till issue of dentures has been sorted out.  Dementia  - on Aricept-given urinary obstruction-will d/c-however per daughter this was just started and patient actually never even took her first dose -start Namenda  TODAY-DAY OF DISCHARGE:  Subjective:   Ariana [name]  feels much better, she is afebrile, her anxiety is much better controlled.  Objective:   Blood pressure 125/65, pulse 73, temperature 98.4 F (36.9 C), temperature source Oral, resp. rate 20, height 5\' 3"  (1.6 m), weight 57.652 kg (127 lb 1.6 oz), SpO2 97.00%.  Intake/Output Summary (Last 24 hours) at 07/25/11 1002 Last data filed at 07/25/11 0945  Gross per 24 hour  Intake    600 ml  Output   1400 ml  Net   -800 ml    Exam Awake Alert, Oriented *3, No new F.N deficits, Normal affect [place].AT,PERRAL Supple Neck,No JVD, No cervical lymphadenopathy appriciated.  Symmetrical Chest wall movement, Good air movement bilaterally, CTAB RRR,No Gallops,Rubs or new Murmurs, No Parasternal Heave +ve B.Sounds, Abd Soft, Non tender, No organomegaly appriciated, No rebound -guarding or rigidity. No Cyanosis, Clubbing or edema, No new Rash or bruise  DISPOSITION: Home with home health services-patient refused to go to SNF  DISCHARGE INSTRUCTIONS:     DIET - DYS 1  Activity-up with assistance  Follow-up Information    Follow up with [facility], NP on 08/01/2011. (at 2:15pm)    Contact information:   [address] [facility]  [facility] [place] [address] [phone]       Schedule an appointment as soon as possible for a visit with [name], MD.   Contact information:   [facility] [address] [place] [address] [phone]       Follow up with DEVESHWAR,SHAILI B, MD. Schedule an appointment as soon as possible for a visit in 2 weeks.   Contact information:   [address] [place] [address] [phone]         Total Time spent on discharge equals 45 minutes.  SignedJeoffrey Massed 07/25/2011 10:02 AM

## 2011-07-22 NOTE — Progress Notes (Signed)
CSW completed PASARR request for patient.  CSW completed FL2 in [employer] however am unable to generate PDF file.  CSW placed call to [employer] tech support who advised they will attempt to duplicate error.  CSW unable to submit request to facilities and patient's insurance due to this tech delay.  Will hand off to staff (6/9).  Marlaine Hind ANN S , MSW, LCSWA 07/22/2011 4:34 PM [phone]

## 2011-07-22 NOTE — Progress Notes (Addendum)
PATIENT DETAILS Name: Ariana [name] Age: 78 y.o. Sex: female Date of Birth: 04/07/1929 Admit Date: 07/18/2011 WUJ:[id number],YNWGN M, MD, MD  Subjective: Much calmer this am Continues to have oral pain Denies abd pain or Headaches to me Had a big BM today am  Objective: Vital signs in last 24 hours: Filed Vitals:   07/21/11 1415 07/21/11 1435 07/21/11 1900 07/22/11 0605  BP:  169/77 157/88 146/73  Pulse: 81 76 75 76  Temp:  97.5 F (36.4 C) 97.8 F (36.6 C) 98 F (36.7 C)  TempSrc:  Oral Oral Oral  Resp:  18 18 20   Height:      Weight:      SpO2: 98% 97% 96% 95%    Weight change:   Body mass index is 21.36 kg/(m^2).  Intake/Output from previous day:  Intake/Output Summary (Last 24 hours) at 07/22/11 0941 Last data filed at 07/22/11 5621  Gross per 24 hour  Intake   1430 ml  Output   2225 ml  Net   -795 ml    PHYSICAL EXAM: Gen Exam: Awake and mostly alert with clear speech.  Neck: Supple, No JVD.   Chest: B/L Clear.No rales or rhonchi CVS: S1 S2 Regular, no murmurs.  Abdomen: soft, BS +, non tender, non distended.  Extremities: no edema, lower extremities warm to touch. Neurologic: Non Focal.   Skin: No Rash.   Wounds: N/A.    CONSULTS:  None  LAB RESULTS: CBC  Lab 07/21/11 0700 07/19/11 0610 07/18/11 1033  WBC 6.9 6.9 6.7  HGB 12.7 11.5* 11.9*  HCT 36.3 32.0* 33.9*  PLT 206 214 232  MCV 89.2 87.4 87.6  MCH 31.2 31.4 30.7  MCHC 35.0 35.9 35.1  RDW 12.1 12.0 12.0  LYMPHSABS -- 1.8 1.3  MONOABS -- 0.7 0.5  EOSABS -- 0.0 0.0  BASOSABS -- 0.0 0.0  BANDABS -- -- --    Chemistries   Lab 07/21/11 0700 07/19/11 0610 07/18/11 1033  NA 137 133* 129*  K 3.8 3.3* 3.9  CL 102 101 92*  CO2 24 21 24   GLUCOSE 121* 99 97  BUN 5* 6 10  CREATININE 0.45* 0.56 0.58  CALCIUM 9.2 8.8 9.6  MG -- -- --    CBG:  Lab 07/22/11 0755 07/21/11 2226 07/21/11 1730 07/21/11 1332 07/21/11 1200  GLUCAP 124* 129* 164* 134* 129*    GFR Estimated Creatinine  Clearance: 46.4 ml/min (by C-G formula based on Cr of 0.45).  Coagulation profile No results found for this basename: INR:5,PROTIME:5 in the last 168 hours  Cardiac Enzymes No results found for this basename: CK:3,CKMB:3,TROPONINI:3,MYOGLOBIN:3 in the last 168 hours  No components found with this basename: POCBNP:3 No results found for this basename: DDIMER:2 in the last 72 hours No results found for this basename: HGBA1C:2 in the last 72 hours No results found for this basename: CHOL:2,HDL:2,LDLCALC:2,TRIG:2,CHOLHDL:2,LDLDIRECT:2 in the last 72 hours No results found for this basename: TSH,T4TOTAL,FREET3,T3FREE,THYROIDAB in the last 72 hours No results found for this basename: VITAMINB12:2,FOLATE:2,FERRITIN:2,TIBC:2,IRON:2,RETICCTPCT:2 in the last 72 hours No results found for this basename: LIPASE:2,AMYLASE:2 in the last 72 hours  Urine Studies No results found for this basename: UACOL:2,UAPR:2,USPG:2,UPH:2,UTP:2,UGL:2,UKET:2,UBIL:2,UHGB:2,UNIT:2,UROB:2,ULEU:2,UEPI:2,UWBC:2,URBC:2,UBAC:2,CAST:2,CRYS:2,UCOM:2,BILUA:2 in the last 72 hours  MICROBIOLOGY: Recent Results (from the past 240 hour(s))  URINE CULTURE     Status: Normal   Collection Time   07/19/11 12:22 AM      Component Value Range Status Comment   Specimen Description URINE, CATHETERIZED   Final    Special Requests  NONE   Final    Culture  Setup Time [id number]   Final    Colony Count NO GROWTH   Final    Culture NO GROWTH   Final    Report Status 07/20/2011 FINAL   Final     RADIOLOGY STUDIES/RESULTS: Ct Head Wo Contrast  07/18/2011  *RADIOLOGY REPORT*  Clinical Data: Headaches and altered level of consciousness.  CT HEAD WITHOUT CONTRAST  Technique:  Contiguous axial images were obtained from the base of the skull through the vertex without contrast.  Comparison: Head CT scan 02/11/2008.  Findings: There is cortical atrophy.  No evidence of acute abnormality including infarction, hemorrhage, mass lesion, mass effect,  midline shift or abnormal extra-axial fluid collection. Calvarium intact.  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  IMPRESSION: No acute finding.  Atrophy noted.  Original Report Authenticated By: Bernadene Bell. Maricela Curet, M.D.   Ct Abdomen Pelvis W Contrast  07/18/2011  *RADIOLOGY REPORT*  Clinical Data: 78 year-old with severe right lower quadrant pain and bloating.  CT ABDOMEN AND PELVIS WITH CONTRAST  Technique:  Multidetector CT imaging of the abdomen and pelvis was performed following the standard protocol during bolus administration of intravenous contrast.  Contrast: 80mL OMNIPAQUE IOHEXOL 300 MG/ML  SOLN  Comparison: None.  Findings: The lung bases are clear.  There is no evidence for free air.  The urinary bladder is markedly distended and could be the source of the patient's lower abdominal pain.  Uterus has been removed. There is a large amount stool in the distal colon and rectum. There is focal wall narrowing in the left transverse colon, best seen on sequence 2, image 52.  There is also focal wall thickening in the transverse colon on image 50.  The appendix is not confidently identified.  There is no significant free fluid or lymphadenopathy in the abdomen or pelvis.  There is mild fullness of the ureters bilaterally and most likely related to the urinary bladder distention.  There is a 0.9 cm low density exophytic structure in the left kidney which is suggestive for a cyst.  There appears to be additional cortical cysts in the left kidney.  There is fullness of the central intrahepatic bile ducts and the common bile duct measures up to 0.9 cm.  The biliary findings are most likely associated with the cholecystectomy. Portal venous system is patent.  No gross abnormality to the spleen, adrenal and pancreas.  Inadvertently, the entire chest was imaged during the delayed renal images.  There is enlargement of the left thyroid lobe and likely related to a nodule.  This  nodule could measures up to 2.7 cm. There is no significant pericardial or pleural fluid.  Ascending thoracic aorta is slightly prominent measuring up to 3.4 cm.  There is no significant chest lymphadenopathy.  The trachea and mainstem bronchi are patent.  The lungs are clear.  No evidence for airspace disease.  There may be scarring near the base the lingula.  Disc space loss at L5-S1.  IMPRESSION: There is marked distention of the urinary bladder and could represent urinary retention.  Two segments of wall thickening in the transverse colon.  Findings could be associated with peristalsis or inflammation but neoplasm cannot be excluded.  Recommend correlation with prior colonoscopy or further evaluation with colonoscopy.  Evidence for colonic diverticulosis.  Large amount of stool in the distal colon and rectum.  Enlargement of the left thyroid lobe.  Recommend further characterization with ultrasound.  Original  Report Authenticated By: Richarda Overlie, M.D.   Dg Chest Port 1 View  07/18/2011  *RADIOLOGY REPORT*  Clinical Data: Shortness of breath, abdominal pain/discomfort, evaluate for pneumonia  PORTABLE CHEST - 1 VIEW  Comparison: 04/08/2011; 01/09/2008  Findings: Grossly unchanged cardiac silhouette and mediastinal contours.  The lungs remain hyperinflated with grossly unchanged linear heterogeneous opacities within the left lower lung, favored to represent subsegmental atelectasis or scar.  There is grossly unchanged mild diffuse thickening of the central airways.  No focal airspace opacities.  No definite pleural effusion or pneumothorax. Unchanged bones.  IMPRESSION: Stable findings of emphysema/bronchitic change without acute cardiopulmonary disease.  Original Report Authenticated By: Waynard Reeds, M.D.    MEDICATIONS: Scheduled Meds:    . aspirin EC  81 mg Oral Daily  . busPIRone  10 mg Oral TID  . cefTRIAXone (ROCEPHIN)  IV  1 g Intravenous Q24H  . cycloSPORINE  1 drop Both Eyes Q12H  . digoxin   125 mcg Oral Daily  . diltiazem  180 mg Oral Daily  . donepezil  5 mg Oral QHS  . DULoxetine  30 mg Oral Daily  . enoxaparin (LOVENOX) injection  40 mg Subcutaneous Daily  . feeding supplement  1 Container Oral TID BM  . folic acid  2 mg Oral Daily  . gabapentin  300 mg Oral TID  . hydroxychloroquine  200 mg Oral Daily  . insulin aspart  0-15 Units Subcutaneous TID WC  . insulin aspart  0-5 Units Subcutaneous QHS  . loteprednol  1 drop Both Eyes BID  . magic mouthwash w/lidocaine  5 mL Oral QID  . metoprolol  25 mg Oral BID  . multivitamin with minerals  1 tablet Oral Daily  . pantoprazole  40 mg Oral Daily  . pilocarpine  5 mg Oral BID  . polyvinyl alcohol  1 drop Both Eyes QID  . DISCONTD: insulin aspart  0-15 Units Subcutaneous Q4H   Continuous Infusions:    . sodium chloride 20 mL/hr (07/20/11 1414)   PRN Meds:.acetaminophen, ALPRAZolam, cyclobenzaprine, HYDROcodone-acetaminophen, senna-docusate  Antibiotics: Anti-infectives     Start     Dose/Rate Route Frequency Ordered Stop   07/19/11 1000   hydroxychloroquine (PLAQUENIL) tablet 200 mg        200 mg Oral Daily 07/18/11 2208     07/19/11 1000   cefTRIAXone (ROCEPHIN) 1 g in dextrose 5 % 50 mL IVPB        1 g 100 mL/hr over 30 Minutes Intravenous Every 24 hours 07/18/11 2208     07/18/11 1245   cefTRIAXone (ROCEPHIN) 1 g in dextrose 5 % 50 mL IVPB        1 g 100 mL/hr over 30 Minutes Intravenous  Once 07/18/11 1233 07/18/11 1332          Assessment/Plan: Principal Problem: Altered Mental Status -this was on admission-likely toxic metabolic encephalopathy-this has now resolved   *UTI (lower urinary tract infection) -Had one Febrile episode on 6/5-no fever for past 72 hours -urine culture showing no growth-since febrile on 6/5-started on Rocephin empircally on admit-given urinary retention and UTI/AMS-will transition to Ceftin on discharge  Urinary Obstruction -continue with foley catheter-spoke with Dr  Ottelin-case/history/labs/radiology discussed, he suggests to leave foley in for a 2-3 weeks, and have patient follow up with him on discharge -outpatient urology appt made for 6/18  Headache - resolved -neck very supple -no meningeal signs -ESR-16-doubt Giant cell arteritis -CT head-no acute abnormalities -did have neck pain/back pain-abdominal  pain as well -has neuropathic pain-started on cymbalta, have increased dose of neurontin  Oral Pain -likely 2/2 to dry mouth from Sjogren's -chronic issue -prn magic mouthwash with lidocaine  Hyponatremia -resolved -likely 2/2 to dehydration  Hypokalemia -replete K-now back to normal  Neuropathy -continue with Neurontin, but increased to 300 mg TID  Anxiety with Depression -significantly  -was on zoloft till recently-but caused excessive sleepiness-therefore stopped -continue with Xanax-apparently intolerant to Klonopin -per Psych MD-Dr Bogard-to start Buspar and Cymbalta -long discussion with daughter 6/7-patient will need to get re-established with a psych MD for further optimization of her meds.   Sjogren's Syndrome -continue with Pilocarpine/Plaquenil -follow's up with Dr Terald Sleeper as outpatient  HTN -controlled with Lopressor/Cardizem  MVP -follow up with Cards (Dr Clarene Duke) as outpatient-however patient wanting to speak with Dr Maryruth Eve by [facility] this admit  GERD -PPI  Dysphagia -Dysphagia 1 diet-appreciate speech eval  Dementia - on Aricept-given urinary obstruction-will d/c-however  -start Namenda  Disposition: Home today  DVT Prophylaxis: Prophylactic Lovenox  Code Status: Full Code  Family Communication -spoke to Daughter-Tiffanyann [name]-[phone] on 6/5 and 6/7-updated her  Jeoffrey Massed, MD  [facility] Pager [phone]  If 7PM-7AM, please contact night-coverage [email or web address] Password [email or web address] 07/22/2011, 9:41 AM   LOS: 4 days

## 2011-07-23 DIAGNOSIS — E782 Mixed hyperlipidemia: Secondary | ICD-10-CM

## 2011-07-23 DIAGNOSIS — E871 Hypo-osmolality and hyponatremia: Secondary | ICD-10-CM

## 2011-07-23 DIAGNOSIS — R51 Headache: Secondary | ICD-10-CM

## 2011-07-23 DIAGNOSIS — R339 Retention of urine, unspecified: Secondary | ICD-10-CM

## 2011-07-23 LAB — GLUCOSE, CAPILLARY
Glucose-Capillary: 148 mg/dL — ABNORMAL HIGH (ref 70–99)
Glucose-Capillary: 132 mg/dL — ABNORMAL HIGH (ref 70–99)

## 2011-07-23 MED ORDER — CEFUROXIME AXETIL 500 MG PO TABS
500.0000 mg | ORAL_TABLET | Freq: Two times a day (BID) | ORAL | Status: AC
  Administered 2011-07-23 – 2011-07-24 (×4): 500 mg via ORAL
  Filled 2011-07-23 (×4): qty 1

## 2011-07-23 MED ORDER — DULOXETINE HCL 60 MG PO CPEP: 60.0000 mg | ORAL_CAPSULE | Freq: Every day | ORAL | Status: DC

## 2011-07-23 MED ORDER — DULOXETINE HCL 20 MG PO CPEP
40.0000 mg | ORAL_CAPSULE | Freq: Every day | ORAL | Status: DC
  Administered 2011-07-23 – 2011-07-25 (×3): 40 mg via ORAL
  Filled 2011-07-23 (×3): qty 2

## 2011-07-23 NOTE — Progress Notes (Signed)
PATIENT DETAILS Name: Ariana [name] Age: 78 y.o. Sex: female Date of Birth: 01-10-1930 Admit Date: 07/18/2011 ZOX:[id number] M, MD, MD  Subjective: Anxiety continues to be better Had intermittent Headaches yesterday Headache minimal today  Objective: Vital signs in last 24 hours: Filed Vitals:   07/22/11 2148 07/23/11 0158 07/23/11 0505 07/23/11 0958  BP: 160/70 155/74 134/72 128/65  Pulse: 72 64 60 57  Temp: 98.2 F (36.8 C) 97.9 F (36.6 C) 98.2 F (36.8 C) 98.2 F (36.8 C)  TempSrc: Oral Oral Oral Oral  Resp: 17 17 17 18   Height:      Weight:   57.652 kg (127 lb 1.6 oz)   SpO2: 94% 94% 94% 96%    Weight change:   Body mass index is 22.51 kg/(m^2).  Intake/Output from previous day:  Intake/Output Summary (Last 24 hours) at 07/23/11 1040 Last data filed at 07/23/11 0612  Gross per 24 hour  Intake 938.67 ml  Output   2000 ml  Net -1061.33 ml    PHYSICAL EXAM: Gen Exam: Awake and mostly alert with clear speech. Not in any distress. Neck: Supple, No JVD.   Chest: B/L Clear.No rales or rhonchi CVS: S1 S2 Regular, no murmurs.  Abdomen: soft, BS +, non tender, non distended.  Extremities: no edema, lower extremities warm to touch. Neurologic: Non Focal.   Skin: No Rash.   Wounds: N/A.    CONSULTS:  None  LAB RESULTS: CBC  Lab 07/21/11 0700 07/19/11 0610 07/18/11 1033  WBC 6.9 6.9 6.7  HGB 12.7 11.5* 11.9*  HCT 36.3 32.0* 33.9*  PLT 206 214 232  MCV 89.2 87.4 87.6  MCH 31.2 31.4 30.7  MCHC 35.0 35.9 35.1  RDW 12.1 12.0 12.0  LYMPHSABS -- 1.8 1.3  MONOABS -- 0.7 0.5  EOSABS -- 0.0 0.0  BASOSABS -- 0.0 0.0  BANDABS -- -- --    Chemistries   Lab 07/21/11 0700 07/19/11 0610 07/18/11 1033  NA 137 133* 129*  K 3.8 3.3* 3.9  CL 102 101 92*  CO2 24 21 24   GLUCOSE 121* 99 97  BUN 5* 6 10  CREATININE 0.45* 0.56 0.58  CALCIUM 9.2 8.8 9.6  MG -- -- --    CBG:  Lab 07/23/11 0752 07/22/11 2206 07/22/11 1716 07/22/11 1212 07/22/11 0755  GLUCAP  148* 116* 159* 143* 124*    GFR Estimated Creatinine Clearance: 46.4 ml/min (by C-G formula based on Cr of 0.45).  Coagulation profile No results found for this basename: INR:5,PROTIME:5 in the last 168 hours  Cardiac Enzymes No results found for this basename: CK:3,CKMB:3,TROPONINI:3,MYOGLOBIN:3 in the last 168 hours  No components found with this basename: POCBNP:3 No results found for this basename: DDIMER:2 in the last 72 hours No results found for this basename: HGBA1C:2 in the last 72 hours No results found for this basename: CHOL:2,HDL:2,LDLCALC:2,TRIG:2,CHOLHDL:2,LDLDIRECT:2 in the last 72 hours No results found for this basename: TSH,T4TOTAL,FREET3,T3FREE,THYROIDAB in the last 72 hours No results found for this basename: VITAMINB12:2,FOLATE:2,FERRITIN:2,TIBC:2,IRON:2,RETICCTPCT:2 in the last 72 hours No results found for this basename: LIPASE:2,AMYLASE:2 in the last 72 hours  Urine Studies No results found for this basename: UACOL:2,UAPR:2,USPG:2,UPH:2,UTP:2,UGL:2,UKET:2,UBIL:2,UHGB:2,UNIT:2,UROB:2,ULEU:2,UEPI:2,UWBC:2,URBC:2,UBAC:2,CAST:2,CRYS:2,UCOM:2,BILUA:2 in the last 72 hours  MICROBIOLOGY: Recent Results (from the past 240 hour(s))  URINE CULTURE     Status: Normal   Collection Time   07/19/11 12:22 AM      Component Value Range Status Comment   Specimen Description URINE, CATHETERIZED   Final    Special Requests NONE  Final    Culture  Setup Time [id number]   Final    Colony Count NO GROWTH   Final    Culture NO GROWTH   Final    Report Status 07/20/2011 FINAL   Final     RADIOLOGY STUDIES/RESULTS: Ct Head Wo Contrast  07/18/2011  *RADIOLOGY REPORT*  Clinical Data: Headaches and altered level of consciousness.  CT HEAD WITHOUT CONTRAST  Technique:  Contiguous axial images were obtained from the base of the skull through the vertex without contrast.  Comparison: Head CT scan 02/11/2008.  Findings: There is cortical atrophy.  No evidence of acute abnormality  including infarction, hemorrhage, mass lesion, mass effect, midline shift or abnormal extra-axial fluid collection. Calvarium intact.  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  IMPRESSION: No acute finding.  Atrophy noted.  Original Report Authenticated By: Bernadene Bell. Maricela Curet, M.D.   Ct Abdomen Pelvis W Contrast  07/18/2011  *RADIOLOGY REPORT*  Clinical Data: 78 year-old with severe right lower quadrant pain and bloating.  CT ABDOMEN AND PELVIS WITH CONTRAST  Technique:  Multidetector CT imaging of the abdomen and pelvis was performed following the standard protocol during bolus administration of intravenous contrast.  Contrast: 80mL OMNIPAQUE IOHEXOL 300 MG/ML  SOLN  Comparison: None.  Findings: The lung bases are clear.  There is no evidence for free air.  The urinary bladder is markedly distended and could be the source of the patient's lower abdominal pain.  Uterus has been removed. There is a large amount stool in the distal colon and rectum. There is focal wall narrowing in the left transverse colon, best seen on sequence 2, image 52.  There is also focal wall thickening in the transverse colon on image 50.  The appendix is not confidently identified.  There is no significant free fluid or lymphadenopathy in the abdomen or pelvis.  There is mild fullness of the ureters bilaterally and most likely related to the urinary bladder distention.  There is a 0.9 cm low density exophytic structure in the left kidney which is suggestive for a cyst.  There appears to be additional cortical cysts in the left kidney.  There is fullness of the central intrahepatic bile ducts and the common bile duct measures up to 0.9 cm.  The biliary findings are most likely associated with the cholecystectomy. Portal venous system is patent.  No gross abnormality to the spleen, adrenal and pancreas.  Inadvertently, the entire chest was imaged during the delayed renal images.  There is enlargement of the  left thyroid lobe and likely related to a nodule.  This nodule could measures up to 2.7 cm. There is no significant pericardial or pleural fluid.  Ascending thoracic aorta is slightly prominent measuring up to 3.4 cm.  There is no significant chest lymphadenopathy.  The trachea and mainstem bronchi are patent.  The lungs are clear.  No evidence for airspace disease.  There may be scarring near the base the lingula.  Disc space loss at L5-S1.  IMPRESSION: There is marked distention of the urinary bladder and could represent urinary retention.  Two segments of wall thickening in the transverse colon.  Findings could be associated with peristalsis or inflammation but neoplasm cannot be excluded.  Recommend correlation with prior colonoscopy or further evaluation with colonoscopy.  Evidence for colonic diverticulosis.  Large amount of stool in the distal colon and rectum.  Enlargement of the left thyroid lobe.  Recommend further characterization with ultrasound.  Original Report Authenticated By:  Richarda Overlie, M.D.   Dg Chest Port 1 View  07/18/2011  *RADIOLOGY REPORT*  Clinical Data: Shortness of breath, abdominal pain/discomfort, evaluate for pneumonia  PORTABLE CHEST - 1 VIEW  Comparison: 04/08/2011; 01/09/2008  Findings: Grossly unchanged cardiac silhouette and mediastinal contours.  The lungs remain hyperinflated with grossly unchanged linear heterogeneous opacities within the left lower lung, favored to represent subsegmental atelectasis or scar.  There is grossly unchanged mild diffuse thickening of the central airways.  No focal airspace opacities.  No definite pleural effusion or pneumothorax. Unchanged bones.  IMPRESSION: Stable findings of emphysema/bronchitic change without acute cardiopulmonary disease.  Original Report Authenticated By: Waynard Reeds, M.D.    MEDICATIONS: Scheduled Meds:    . aspirin EC  81 mg Oral Daily  . busPIRone  10 mg Oral TID  . cefTRIAXone (ROCEPHIN)  IV  1 g Intravenous  Q24H  . cycloSPORINE  1 drop Both Eyes Q12H  . digoxin  125 mcg Oral Daily  . diltiazem  180 mg Oral Daily  . donepezil  5 mg Oral QHS  . DULoxetine  40 mg Oral Daily  . enoxaparin (LOVENOX) injection  40 mg Subcutaneous Daily  . feeding supplement  1 Container Oral TID BM  . folic acid  2 mg Oral Daily  . gabapentin  300 mg Oral TID  . hydroxychloroquine  200 mg Oral Daily  . insulin aspart  0-15 Units Subcutaneous TID WC  . insulin aspart  0-5 Units Subcutaneous QHS  . loteprednol  1 drop Both Eyes BID  . magic mouthwash w/lidocaine  5 mL Oral QID  . memantine  5 mg Oral Custom  . metoprolol  25 mg Oral BID  . multivitamin with minerals  1 tablet Oral Daily  . pantoprazole  40 mg Oral Daily  . pilocarpine  5 mg Oral BID  . polyvinyl alcohol  1 drop Both Eyes QID  . DISCONTD: DULoxetine  30 mg Oral Daily  . DISCONTD: DULoxetine  60 mg Oral Daily   Continuous Infusions:    . sodium chloride 20 mL (07/22/11 1729)   PRN Meds:.acetaminophen, ALPRAZolam, cyclobenzaprine, HYDROcodone-acetaminophen, ondansetron (ZOFRAN) IV, senna-docusate, DISCONTD: HYDROcodone-acetaminophen  Antibiotics: Anti-infectives     Start     Dose/Rate Route Frequency Ordered Stop   07/22/11 0000   cefUROXime (CEFTIN) 500 MG tablet        500 mg Oral 2 times daily 07/22/11 0956 08/01/11 2359   07/19/11 1000   hydroxychloroquine (PLAQUENIL) tablet 200 mg        200 mg Oral Daily 07/18/11 2208     07/19/11 1000   cefTRIAXone (ROCEPHIN) 1 g in dextrose 5 % 50 mL IVPB        1 g 100 mL/hr over 30 Minutes Intravenous Every 24 hours 07/18/11 2208     07/18/11 1245   cefTRIAXone (ROCEPHIN) 1 g in dextrose 5 % 50 mL IVPB        1 g 100 mL/hr over 30 Minutes Intravenous  Once 07/18/11 1233 07/18/11 1332          Assessment/Plan: Principal Problem: Altered Mental Status -this was on admission-likely toxic metabolic encephalopathy-this has now resolved   *UTI (lower urinary tract infection) -Had one  Febrile episode on 6/5-no fever for past 72 hours -urine culture showing no growth-since febrile on 6/5-started on Rocephin empircally on admit-given urinary retention and UTI/AMS-transition to Ceftin today-and continue for 2 more days  Urinary Obstruction -continue with foley catheter-spoke with Dr Ottelin-case/history/labs/radiology  discussed, he suggests to leave foley in for a 2-3 weeks, and have patient follow up with him on discharge -outpatient urology appt made for 6/18  Headache -intermittently continues -neck very supple -no meningeal signs -ESR-16-doubt Giant cell arteritis -CT head-no acute abnormalities -did have neck pain/back pain-abdominal pain as well -has neuropathic pain-started on cymbalta, have increased dose of neurontin to 300 TID, increase Cymbalta to 40 mg daily  Oral Pain -likely 2/2 to dry mouth from Sjogren's -chronic issue -prn magic mouthwash with lidocaine  Hyponatremia -resolved -likely 2/2 to dehydration  Hypokalemia -replete K-now back to normal  Neuropathy -continue with Neurontin, but increased to 300 mg TID  Anxiety with Depression -significantly  -was on zoloft till recently-but caused excessive sleepiness-therefore stopped -continue with Xanax-apparently intolerant to Klonopin -per Psych MD-Dr Bogard-to start Buspar and Cymbalta -long discussion with daughter 6/7-patient will need to get re-established with a psych MD for further optimization of her meds.   Sjogren's Syndrome -continue with Pilocarpine/Plaquenil -follow's up with Dr Terald Sleeper as outpatient  HTN -controlled with Lopressor/Cardizem  MVP -follow up with Cards (Dr Clarene Duke) as outpatient-however patient wanting to speak with Dr Maryruth Eve by [facility] this admit  GERD -PPI  Dysphagia -Dysphagia 1 diet-appreciate speech eval  Dementia - on Aricept-given urinary obstruction-will d/c-however  -start Namenda  Disposition: Home today  DVT Prophylaxis: Prophylactic  Lovenox  Code Status: Full Code  Family Communication -spoke to Daughter-Emryn [name]-[phone] on 6/5, 6/7, 6/8-updated her  Jeoffrey Massed, MD  [facility] Pager [phone]  If 7PM-7AM, please contact night-coverage [email or web address] Password [email or web address] 07/23/2011, 10:40 AM   LOS: 5 days

## 2011-07-24 DIAGNOSIS — E782 Mixed hyperlipidemia: Secondary | ICD-10-CM

## 2011-07-24 DIAGNOSIS — R339 Retention of urine, unspecified: Secondary | ICD-10-CM

## 2011-07-24 DIAGNOSIS — R4182 Altered mental status, unspecified: Secondary | ICD-10-CM

## 2011-07-24 DIAGNOSIS — E871 Hypo-osmolality and hyponatremia: Secondary | ICD-10-CM

## 2011-07-24 DIAGNOSIS — R51 Headache: Secondary | ICD-10-CM

## 2011-07-24 LAB — GLUCOSE, CAPILLARY
Glucose-Capillary: 108 mg/dL — ABNORMAL HIGH (ref 70–99)
Glucose-Capillary: 109 mg/dL — ABNORMAL HIGH (ref 70–99)

## 2011-07-24 NOTE — Progress Notes (Signed)
Clinical Social Work Department CLINICAL SOCIAL WORK PLACEMENT NOTE 07/24/2011  Patient:  Ariana [name], Ariana [name]  Account Number:  [id number] Admit date:  07/18/2011  Clinical Social Worker:  Jacelyn Grip  Date/time:  07/24/2011 08:30 AM  Clinical Social Work is seeking post-discharge placement for this patient at the following level of care:   SKILLED NURSING   (*CSW will update this form in Epic as items are completed)   07/24/2011  Patient/family provided with [facility] Department of Clinical Social Work's list of facilities offering this level of care within the geographic area requested by the patient (or if unable, by the patient's family).  07/24/2011  Patient/family informed of their freedom to choose among providers that offer the needed level of care, that participate in Medicare, Medicaid or managed care program needed by the patient, have an available bed and are willing to accept the patient.  07/24/2011  Patient/family informed of [facility]' ownership interest in [facility], as well as of the fact that they are under no obligation to receive care at this facility.  PASARR submitted to [employer] on 07/24/2011 PASARR number received from [employer] on   FL2 transmitted to all facilities in geographic area requested by pt/family on  07/24/2011 FL2 transmitted to all facilities within larger geographic area on   Patient informed that his/her managed care company has contracts with or will negotiate with  certain facilities, including the following:     Patient/family informed of bed offers received:   Patient chooses bed at  Physician recommends and patient chooses bed at    Patient to be transferred to  on   Patient to be transferred to facility by   The following physician request were entered in Epic:   Additional Comments:   Jacklynn Lewis, MSW, LCSWA  Clinical Social Work [phone]

## 2011-07-24 NOTE — Progress Notes (Signed)
Physical Therapy Treatment Patient Details Name: Ariana [name] MRN: [id number] DOB: 1929/06/25 Today's Date: 07/24/2011 Time: 1334-1400 PT Time Calculation (min): 26 min  PT Assessment / Plan / Recommendation Comments on Treatment Session  Pt adm with AMS.  Pt with better mobility today but still needs 24 hour assist.  Family has decided that they can't provide 24 hours so pt will be ST-SNF.    Follow Up Recommendations  Skilled nursing facility    Barriers to Discharge        Equipment Recommendations  Defer to next venue    Recommendations for Other Services    Frequency Min 2X/week   Plan Discharge plan needs to be updated;Frequency needs to be updated    Precautions / Restrictions Precautions Precautions: Fall   Pertinent Vitals/Pain N/A    Mobility  Transfers Sit to Stand: 4: Min assist;With upper extremity assist;From chair/3-in-1 Stand to Sit: 4: Min assist;With upper extremity assist;To bed Details for Transfer Assistance: Cues for hand placement. Ambulation/Gait Ambulation/Gait Assistance: 4: Min assist Ambulation Distance (Feet): 175 Feet Assistive device: 1 person hand held assist Gait Pattern: Narrow base of support;Decreased stride length    Exercises     PT Diagnosis:    PT Problem List:   PT Treatment Interventions:     PT Goals Acute Rehab PT Goals PT Goal: Sit to Stand - Progress: Progressing toward goal PT Goal: Ambulate - Progress: Progressing toward goal  Visit Information  Last PT Received On: 07/24/11 Assistance Needed: +1    Subjective Data  Subjective: Pt rambling about things that happened in the past.   Cognition  Overall Cognitive Status: History of cognitive impairments - at baseline Arousal/Alertness: Awake/alert Orientation Level: Disoriented to;Time Behavior During Session: Anxious    Balance  Static Standing Balance Static Standing - Balance Support: Right upper extremity supported Static Standing - Level of Assistance:  4: Min assist  End of Session      Ariana [name] 07/24/2011, 2:48 PM  [facility] PT [phone]

## 2011-07-24 NOTE — Consult Note (Signed)
[facility] CONSULT NOTE     Reason for Consult: HA    HPI:    Ariana [name] is an 78 y.o. female who was admitted initially for UTI. While in the hospital patient stated she has suffered from "on and off" headaches. Neurology asked to evaluate for headaches.  On consultation patient is very tense, has a hard time concentrating, uneasy appearence, easily distractable and cannot hold a meaningfull conversation. She often times will talk in nonsensical sentences but is able to follow my commands at times. She has no complaints of HA to me and cannot tell me what her HA is on a scale from 1-10.   While in the hospital her Cymbalta has been increased from 30 mg daily to 40 mg daily and her Neurontin has been increased from 100 mg TID to 300 mg TID. In addition she has been receiving Norco 2 tablets every 6 hours.     Past Medical History  Diagnosis Date  . Sjogren's syndrome   . Dry eye syndrome   . Hypertension, benign   . Mitral valve prolapse   . GERD (gastroesophageal reflux disease)   . Diverticulosis of colon   . Irritable bowel syndrome   . Urinary incontinence   . Low back pain syndrome   . Fibromyalgia   . Memory loss   . Anxiety and depression   . History of adverse drug reaction   . Peripheral neuropathy     "both feet and legs"  . Shortness of breath 07/18/11    "alot lately"  . Anginal pain   . History of recurrent UTIs   . H/O hiatal hernia   . Anxiety   . Dementia   . Depression     Past Surgical History  Procedure Date  . Vesicovaginal fistula closure w/ tah   . Appendectomy   . Cholecystectomy   . Mandible surgery   . Temporomandibular joint surgery   . Cataract extraction, bilateral   . Abdominal hysterectomy   . Dental surgery     multiple tooth extractions    Family History  Problem Relation Age of Onset  . Colon cancer    . Heart disease Father   . Pneumonia Mother   . Heart attack Mother   . Cancer Sister     . Cancer Brother     Social History:  reports that she has quit smoking. She has never used smokeless tobacco. She reports that she does not drink alcohol. Her drug history not on file.  Allergies  Allergen Reactions  . Codeine Nausea Only    unless given with Phenergan  . Doxycycline     Unknown  . Klonopin (Clonazepam)     Causes hallucination   . Meperidine Hcl Nausea Only    unless given with Phenergan  . Naproxen   . Norflex (Orphenadrine Citrate) Nausea Only    Unless given with Phenergan  . Oxycodone-Acetaminophen Nausea Only    unless given with phenergan  . Penicillins     Unknown  . Phenothiazines     Unknown  . Propoxyphene Hcl Nausea Only    unless given with phenergan  . Stelazine     Unknown  . Sulfamethoxazole W-Trimethoprim     Unknown  . Tolectin (Tolmetin Sodium)     Unknown  . Tramadol     Unknown  . Zoloft (  Sertraline Hcl)     Caused pt to sleep a lot  . Lubiprostone Rash    Medications:    Prior to Admission:  Prescriptions prior to admission  Medication Sig Dispense Refill  . ALPRAZolam (XANAX) 0.5 MG tablet Take 0.5 mg by mouth 2 (two) times daily as needed. For anxiety      . aspirin 81 MG tablet Take 81 mg by mouth daily.        . Calcium Carbonate-Vitamin D (CALTRATE 600+D) 600-400 MG-UNIT per tablet Take 2 tablets by mouth daily.        . carboxymethylcellulose (REFRESH TEARS) 0.5 % SOLN 1 drop 2 (two) times daily.        . cyclobenzaprine (FLEXERIL) 10 MG tablet Take 1/2 to 1 tablet by mouth 4 times daily as needed for msucle spasm  120 tablet  5  . cycloSPORINE (RESTASIS) 0.05 % ophthalmic emulsion Place 1 drop into both eyes every 12 (twelve) hours.       Marland Kitchen dicyclomine (BENTYL) 20 MG tablet TAKE 1 TABLET EVERY 6 HOURS AS NEEDED FOR ABDOMINAL CRAMPING  50 tablet  10  . digoxin (LANOXIN) 0.125 MG tablet Take 125 mcg by mouth daily.        Marland Kitchen diltiazem (CARDIZEM CD) 180 MG 24 hr capsule Take 1 capsule (180 mg total) by mouth daily.  30  capsule  11  . folic acid (FOLVITE) 1 MG tablet Take 2 mg by mouth daily.      Marland Kitchen HYDROcodone-acetaminophen (VICODIN) 5-500 MG per tablet Take 1 tablet by mouth three times daily as needed for pain--DO NOT EXCEED 3 PER DAY.  90 tablet  5  . hydroxychloroquine (PLAQUENIL) 200 MG tablet Take 200 mg by mouth daily. As directed by Dr. Corliss Skains       . hydroxypropyl methylcellulose (ISOPTO TEARS) 2.5 % ophthalmic solution Place 1 drop into both eyes 4 (four) times daily.      . lansoprazole (PREVACID) 30 MG capsule Take 1 capsule (30 mg total) by mouth daily.  30 capsule  11  . loteprednol (LOTEMAX) 0.5 % ophthalmic suspension Place 1 drop into both eyes 2 (two) times daily.      . metoprolol (LOPRESSOR) 50 MG tablet Take 25 mg by mouth 2 (two) times daily.      . Multiple Vitamin (MULTIVITAMIN) tablet Take 1 tablet by mouth daily.        . Omega-3 Fatty Acids (FISH OIL) 1000 MG CAPS Take 1 capsule by mouth daily.        . pilocarpine (SALAGEN) 5 MG tablet Take 5 mg by mouth 2 (two) times daily. As directed by Dr. Corliss Skains       . polyethylene glycol (MIRALAX / GLYCOLAX) packet Take 17 g by mouth daily.        . sennosides-docusate sodium (SENOKOT-S) 8.6-50 MG tablet 1-2 tabs by mouth at bedtime       . DISCONTD: donepezil (ARICEPT) 10 MG tablet Take 1/2 tablet by mouth daily x 1 month, then increase to 1 tablet by mouth daily.  30 tablet  11  . DISCONTD: gabapentin (NEURONTIN) 100 MG capsule Take 1 capsule (100 mg total) by mouth 3 (three) times daily.  90 capsule  5  . estrogens, conjugated, (PREMARIN) 0.9 MG tablet Take 0.9 mg by mouth daily. Take daily for 21 days then do not take for 7 days.       Marland Kitchen DISCONTD: furosemide (LASIX) 20 MG tablet Take one tablet by mouth  every other day        Scheduled:   . aspirin EC  81 mg Oral Daily  . busPIRone  10 mg Oral TID  . cefUROXime  500 mg Oral BID WC  . cycloSPORINE  1 drop Both Eyes Q12H  . digoxin  125 mcg Oral Daily  . diltiazem  180 mg Oral  Daily  . DULoxetine  40 mg Oral Daily  . enoxaparin (LOVENOX) injection  40 mg Subcutaneous Daily  . feeding supplement  1 Container Oral TID BM  . folic acid  2 mg Oral Daily  . gabapentin  300 mg Oral TID  . hydroxychloroquine  200 mg Oral Daily  . insulin aspart  0-15 Units Subcutaneous TID WC  . insulin aspart  0-5 Units Subcutaneous QHS  . loteprednol  1 drop Both Eyes BID  . magic mouthwash w/lidocaine  5 mL Oral QID  . memantine  5 mg Oral Custom  . metoprolol  25 mg Oral BID  . multivitamin with minerals  1 tablet Oral Daily  . pantoprazole  40 mg Oral Daily  . pilocarpine  5 mg Oral BID  . polyvinyl alcohol  1 drop Both Eyes QID    Review of Systems - General ROS: negative for - chills, fatigue, fever or hot flashes Hematological and Lymphatic ROS: negative for - bruising, fatigue, jaundice or pallor Endocrine ROS: negative for - hair pattern changes, hot flashes, mood swings or skin changes Respiratory ROS: negative for - cough, hemoptysis, orthopnea or wheezing Cardiovascular ROS: negative for - dyspnea on exertion, orthopnea, palpitations or shortness of breath Gastrointestinal ROS: negative for - abdominal pain, appetite loss, blood in stools, diarrhea or hematemesis Musculoskeletal ROS: negative for - joint pain, joint stiffness, joint swelling or muscle pain Neurological ROS: positive for - headaches Dermatological ROS: negative for dry skin, pruritus and rash   Blood pressure 153/64, pulse 69, temperature 97.9 F (36.6 C), temperature source Oral, resp. rate 18, height 5\' 3"  (1.6 m), weight 57.652 kg (127 lb 1.6 oz), SpO2 95.00%.   Neurologic Examination:   Exam limited do to patient difficulty relaxing and following commands.   Mental Status: Alert, not oriented to place-date-or year, thought content is tangential and often nonsensical.  When I enter the room the patient is speaking as if someone is in the room talking to her.  Speech fluent but often makes no  sense.  When asked who cares for her she starts by talking about her father and then starts talking about cars.  Reports that she is pregnant.  Follows some simple commands but unable to follow 3-step commands.   Cranial Nerves: II-Visual fields grossly intact. III/IV/VI-Extraocular movements intact.  Pupils reactive bilaterally. V/VII-Smile symmetric VIII-grossly intact IX/X-normal gag XI-bilateral shoulder shrug XII-midline tongue extension Motor: Will not relax and constantly moving. Moves extremities against gravity easily and shows no evidence of focality. Sensory: Responds to noxious stimuli in all extremities Deep Tendon Reflexes: Unable to asses Cerebellar: Tremors noted throughout    Lab Results  Component Value Date/Time   CHOL 128 07/19/2011  6:10 AM    Results for orders placed during the hospital encounter of 07/18/11 (from the past 48 hour(s))  GLUCOSE, CAPILLARY     Status: Abnormal   Collection Time   07/22/11  5:16 PM      Component Value Range Comment   Glucose-Capillary 159 (*) 70 - 99 (mg/dL)   GLUCOSE, CAPILLARY     Status: Abnormal   Collection Time  07/22/11 10:06 PM      Component Value Range Comment   Glucose-Capillary 116 (*) 70 - 99 (mg/dL)    Comment 1 Notify RN     GLUCOSE, CAPILLARY     Status: Abnormal   Collection Time   07/23/11  7:52 AM      Component Value Range Comment   Glucose-Capillary 148 (*) 70 - 99 (mg/dL)   GLUCOSE, CAPILLARY     Status: Abnormal   Collection Time   07/23/11 12:07 PM      Component Value Range Comment   Glucose-Capillary 140 (*) 70 - 99 (mg/dL)   GLUCOSE, CAPILLARY     Status: Abnormal   Collection Time   07/23/11  5:18 PM      Component Value Range Comment   Glucose-Capillary 132 (*) 70 - 99 (mg/dL)   GLUCOSE, CAPILLARY     Status: Abnormal   Collection Time   07/23/11  9:13 PM      Component Value Range Comment   Glucose-Capillary 135 (*) 70 - 99 (mg/dL)    Comment 1 Notify RN     GLUCOSE, CAPILLARY     Status:  Abnormal   Collection Time   07/24/11  7:55 AM      Component Value Range Comment   Glucose-Capillary 122 (*) 70 - 99 (mg/dL)   GLUCOSE, CAPILLARY     Status: Abnormal   Collection Time   07/24/11 11:56 AM      Component Value Range Comment   Glucose-Capillary 115 (*) 70 - 99 (mg/dL)     No results found.   Assessment/Plan:   78 YO female with significant PMHx including dementia, fibromyalgia, presenting with HA and AMS.  At present time not complaining of HA but very anxious and changes the focus of her conversation frequently.  At other times seems to suggest that she may be having some pain.  No focality noted on exam.  CT unremarkable.  Would not be able to obtain a MRI on this patient.  Based on current exam I do not believe that you can put stock in when she complains of pain.  Would not necessarily treat pain for which there was no referable etiology.  Distraction seem to take her mind off any particular complaint.   As an aside it is unclear what the baseline is for this patient but she is on many medications that may be negatively affecting her mental status to include Cymbalta, Neurontin, narcotics and Buspar.  She has been taken off of her Xanax which may be negatively impacting her as well.  Patient has been changed to Namenda.  Although this may not be well served while an inpatient this regimen should be reviewed for its necessity.    Recommend: 1) Continue ASA daily 1) Restart Xanax at 0.5 mg BID. No further neurologic work up recommended at this time.      Felicie Morn PA-C [facility] [phone]  07/24/2011, 3:33 PM    Patient seen and examined. I agree with the above.  Thana Farr, MD [facility] [phone]  07/24/2011  5:59 PM

## 2011-07-24 NOTE — Progress Notes (Signed)
Clinical Social Worker reviewed chart and noted [facility] technical difficulties during weekend and initiation of SNF search delayed due to this technical difficulty. Clinical Social Worker was able to initiate SNF search this AM and submitted pt clinical information to [facility] for prior approval for SNF placement. Clinical Social Worker reviewed [employer] and added information in regard to pt anxiety/depression for updated and accurate pasarr screen. Clinical Social Worker met with pt at bedside, but pt with confusion at this time. Clinical Social Worker contacted pt daughter via telephone to discuss SNF bed offers and SNF placement. Clinical Social Worker clarified pt daughter questions in regard to insurance and SNF placement. Pt daughter chooses bed at [facility]. Clinical Social Worker notified facility and bed availability for 07/25/11. Clinical Social Worker notified [employer] and await insurance authorization. Clinical Social Worker to facilitate pt discharge needs when insurance authorization and pasarr number received.   Jacklynn Lewis, MSW, LCSWA  Clinical Social Work [phone]

## 2011-07-24 NOTE — Progress Notes (Signed)
PATIENT DETAILS Name: Ariana [name] Age: 78 y.o. Sex: female Date of Birth: 03-11-1929 Admit Date: 07/18/2011 ZOX:[id number] M, MD, MD  Subjective: Intermittent Headache-"comes and goes"  Objective: Vital signs in last 24 hours: Filed Vitals:   07/23/11 2115 07/24/11 0240 07/24/11 0507 07/24/11 0958  BP: 159/70 163/69 131/60 153/57  Pulse: 62 60 62 73  Temp: 98.4 F (36.9 C) 98 F (36.7 C) 98 F (36.7 C) 98.2 F (36.8 C)  TempSrc: Oral Oral Oral Oral  Resp: 17 18 17 18   Height:      Weight:      SpO2: 95% 97% 97% 99%    Weight change:   Body mass index is 22.51 kg/(m^2).  Intake/Output from previous day:  Intake/Output Summary (Last 24 hours) at 07/24/11 1119 Last data filed at 07/24/11 0900  Gross per 24 hour  Intake 1087.67 ml  Output   1700 ml  Net -612.33 ml    PHYSICAL EXAM: Gen Exam: Awake and mostly alert with clear speech. Not in any distress. Neck: Supple, No JVD.   Chest: B/L Clear.No rales or rhonchi CVS: S1 S2 Regular, no murmurs.  Abdomen: soft, BS +, non tender, non distended.  Extremities: no edema, lower extremities warm to touch. Neurologic: Non Focal.   Skin: No Rash.   Wounds: N/A.    CONSULTS:  None  LAB RESULTS: CBC  Lab 07/21/11 0700 07/19/11 0610 07/18/11 1033  WBC 6.9 6.9 6.7  HGB 12.7 11.5* 11.9*  HCT 36.3 32.0* 33.9*  PLT 206 214 232  MCV 89.2 87.4 87.6  MCH 31.2 31.4 30.7  MCHC 35.0 35.9 35.1  RDW 12.1 12.0 12.0  LYMPHSABS -- 1.8 1.3  MONOABS -- 0.7 0.5  EOSABS -- 0.0 0.0  BASOSABS -- 0.0 0.0  BANDABS -- -- --    Chemistries   Lab 07/21/11 0700 07/19/11 0610 07/18/11 1033  NA 137 133* 129*  K 3.8 3.3* 3.9  CL 102 101 92*  CO2 24 21 24   GLUCOSE 121* 99 97  BUN 5* 6 10  CREATININE 0.45* 0.56 0.58  CALCIUM 9.2 8.8 9.6  MG -- -- --    CBG:  Lab 07/24/11 0755 07/23/11 2113 07/23/11 1718 07/23/11 1207 07/23/11 0752  GLUCAP 122* 135* 132* 140* 148*    GFR Estimated Creatinine Clearance: 46.4 ml/min  (by C-G formula based on Cr of 0.45).  Coagulation profile No results found for this basename: INR:5,PROTIME:5 in the last 168 hours  Cardiac Enzymes No results found for this basename: CK:3,CKMB:3,TROPONINI:3,MYOGLOBIN:3 in the last 168 hours  No components found with this basename: POCBNP:3 No results found for this basename: DDIMER:2 in the last 72 hours No results found for this basename: HGBA1C:2 in the last 72 hours No results found for this basename: CHOL:2,HDL:2,LDLCALC:2,TRIG:2,CHOLHDL:2,LDLDIRECT:2 in the last 72 hours No results found for this basename: TSH,T4TOTAL,FREET3,T3FREE,THYROIDAB in the last 72 hours No results found for this basename: VITAMINB12:2,FOLATE:2,FERRITIN:2,TIBC:2,IRON:2,RETICCTPCT:2 in the last 72 hours No results found for this basename: LIPASE:2,AMYLASE:2 in the last 72 hours  Urine Studies No results found for this basename: UACOL:2,UAPR:2,USPG:2,UPH:2,UTP:2,UGL:2,UKET:2,UBIL:2,UHGB:2,UNIT:2,UROB:2,ULEU:2,UEPI:2,UWBC:2,URBC:2,UBAC:2,CAST:2,CRYS:2,UCOM:2,BILUA:2 in the last 72 hours  MICROBIOLOGY: Recent Results (from the past 240 hour(s))  URINE CULTURE     Status: Normal   Collection Time   07/19/11 12:22 AM      Component Value Range Status Comment   Specimen Description URINE, CATHETERIZED   Final    Special Requests NONE   Final    Culture  Setup Time [id number]   Final  Colony Count NO GROWTH   Final    Culture NO GROWTH   Final    Report Status 07/20/2011 FINAL   Final     RADIOLOGY STUDIES/RESULTS: Ct Head Wo Contrast  07/18/2011  *RADIOLOGY REPORT*  Clinical Data: Headaches and altered level of consciousness.  CT HEAD WITHOUT CONTRAST  Technique:  Contiguous axial images were obtained from the base of the skull through the vertex without contrast.  Comparison: Head CT scan 02/11/2008.  Findings: There is cortical atrophy.  No evidence of acute abnormality including infarction, hemorrhage, mass lesion, mass effect, midline shift or  abnormal extra-axial fluid collection. Calvarium intact.  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  IMPRESSION: No acute finding.  Atrophy noted.  Original Report Authenticated By: Bernadene Bell. Maricela Curet, M.D.   Ct Abdomen Pelvis W Contrast  07/18/2011  *RADIOLOGY REPORT*  Clinical Data: 78 year-old with severe right lower quadrant pain and bloating.  CT ABDOMEN AND PELVIS WITH CONTRAST  Technique:  Multidetector CT imaging of the abdomen and pelvis was performed following the standard protocol during bolus administration of intravenous contrast.  Contrast: 80mL OMNIPAQUE IOHEXOL 300 MG/ML  SOLN  Comparison: None.  Findings: The lung bases are clear.  There is no evidence for free air.  The urinary bladder is markedly distended and could be the source of the patient's lower abdominal pain.  Uterus has been removed. There is a large amount stool in the distal colon and rectum. There is focal wall narrowing in the left transverse colon, best seen on sequence 2, image 52.  There is also focal wall thickening in the transverse colon on image 50.  The appendix is not confidently identified.  There is no significant free fluid or lymphadenopathy in the abdomen or pelvis.  There is mild fullness of the ureters bilaterally and most likely related to the urinary bladder distention.  There is a 0.9 cm low density exophytic structure in the left kidney which is suggestive for a cyst.  There appears to be additional cortical cysts in the left kidney.  There is fullness of the central intrahepatic bile ducts and the common bile duct measures up to 0.9 cm.  The biliary findings are most likely associated with the cholecystectomy. Portal venous system is patent.  No gross abnormality to the spleen, adrenal and pancreas.  Inadvertently, the entire chest was imaged during the delayed renal images.  There is enlargement of the left thyroid lobe and likely related to a nodule.  This nodule could measures  up to 2.7 cm. There is no significant pericardial or pleural fluid.  Ascending thoracic aorta is slightly prominent measuring up to 3.4 cm.  There is no significant chest lymphadenopathy.  The trachea and mainstem bronchi are patent.  The lungs are clear.  No evidence for airspace disease.  There may be scarring near the base the lingula.  Disc space loss at L5-S1.  IMPRESSION: There is marked distention of the urinary bladder and could represent urinary retention.  Two segments of wall thickening in the transverse colon.  Findings could be associated with peristalsis or inflammation but neoplasm cannot be excluded.  Recommend correlation with prior colonoscopy or further evaluation with colonoscopy.  Evidence for colonic diverticulosis.  Large amount of stool in the distal colon and rectum.  Enlargement of the left thyroid lobe.  Recommend further characterization with ultrasound.  Original Report Authenticated By: Richarda Overlie, M.D.   Dg Chest Port 1 View  07/18/2011  *RADIOLOGY REPORT*  Clinical Data: Shortness of breath, abdominal pain/discomfort, evaluate for pneumonia  PORTABLE CHEST - 1 VIEW  Comparison: 04/08/2011; 01/09/2008  Findings: Grossly unchanged cardiac silhouette and mediastinal contours.  The lungs remain hyperinflated with grossly unchanged linear heterogeneous opacities within the left lower lung, favored to represent subsegmental atelectasis or scar.  There is grossly unchanged mild diffuse thickening of the central airways.  No focal airspace opacities.  No definite pleural effusion or pneumothorax. Unchanged bones.  IMPRESSION: Stable findings of emphysema/bronchitic change without acute cardiopulmonary disease.  Original Report Authenticated By: Waynard Reeds, M.D.    MEDICATIONS: Scheduled Meds:    . aspirin EC  81 mg Oral Daily  . busPIRone  10 mg Oral TID  . cefUROXime  500 mg Oral BID WC  . cycloSPORINE  1 drop Both Eyes Q12H  . digoxin  125 mcg Oral Daily  . diltiazem  180  mg Oral Daily  . DULoxetine  40 mg Oral Daily  . enoxaparin (LOVENOX) injection  40 mg Subcutaneous Daily  . feeding supplement  1 Container Oral TID BM  . folic acid  2 mg Oral Daily  . gabapentin  300 mg Oral TID  . hydroxychloroquine  200 mg Oral Daily  . insulin aspart  0-15 Units Subcutaneous TID WC  . insulin aspart  0-5 Units Subcutaneous QHS  . loteprednol  1 drop Both Eyes BID  . magic mouthwash w/lidocaine  5 mL Oral QID  . memantine  5 mg Oral Custom  . metoprolol  25 mg Oral BID  . multivitamin with minerals  1 tablet Oral Daily  . pantoprazole  40 mg Oral Daily  . pilocarpine  5 mg Oral BID  . polyvinyl alcohol  1 drop Both Eyes QID   Continuous Infusions:    . sodium chloride 20 mL (07/22/11 1729)   PRN Meds:.acetaminophen, ALPRAZolam, cyclobenzaprine, HYDROcodone-acetaminophen, ondansetron (ZOFRAN) IV, senna-docusate  Antibiotics: Anti-infectives     Start     Dose/Rate Route Frequency Ordered Stop   07/23/11 1100   cefUROXime (CEFTIN) tablet 500 mg        500 mg Oral 2 times daily with meals 07/23/11 1044 07/25/11 0759   07/22/11 0000   cefUROXime (CEFTIN) 500 MG tablet        500 mg Oral 2 times daily 07/22/11 0956 08/01/11 2359   07/19/11 1000   hydroxychloroquine (PLAQUENIL) tablet 200 mg        200 mg Oral Daily 07/18/11 2208     07/19/11 1000   cefTRIAXone (ROCEPHIN) 1 g in dextrose 5 % 50 mL IVPB  Status:  Discontinued        1 g 100 mL/hr over 30 Minutes Intravenous Every 24 hours 07/18/11 2208 07/23/11 1044   07/18/11 1245   cefTRIAXone (ROCEPHIN) 1 g in dextrose 5 % 50 mL IVPB        1 g 100 mL/hr over 30 Minutes Intravenous  Once 07/18/11 1233 07/18/11 1332          Assessment/Plan: Principal Problem: Altered Mental Status -this was on admission-likely toxic metabolic encephalopathy-this has now resolved and back to usual baseline. She does have dementia at baseline   *UTI (lower urinary tract infection) -Had one Febrile episode on  6/5-no fever for past 72 hours -urine culture showing no growth-since febrile on 6/5-started on Rocephin empircally on admit-given urinary retention and UTI/AMS-transition to Ceftin today-and continue for 2 more days from 6/9  Urinary Obstruction -continue with foley catheter-spoke with Dr  Ottelin-case/history/labs/radiology discussed, he suggests to leave foley in for a 2-3 weeks, and have patient follow up with him on discharge -outpatient urology appt made for 6/18  Headache -intermittently continues -neck very supple -no meningeal signs -ESR-16-doubt Giant cell arteritis -CT head-no acute abnormalities -did have neck pain/back pain-abdominal pain as well -has neuropathic pain-started on cymbalta, have increased dose of neurontin to 300 TID, increase Cymbalta to 40 mg daily on 6/9  Oral Pain -likely 2/2 to dry mouth from Sjogren's -chronic issue -prn magic mouthwash with lidocaine  Hyponatremia -resolved -likely 2/2 to dehydration  Hypokalemia -replete K-now back to normal  Neuropathy -continue with Neurontin, but increased to 300 mg TID  Anxiety with Depression -significantly  -was on zoloft till recently-but caused excessive sleepiness-therefore stopped -continue with Xanax-apparently intolerant to Klonopin -per Psych MD-Dr Bogard-to start Buspar and Cymbalta -long discussion with daughter 6/7-patient will need to get re-established with a psych MD for further optimization of her meds.   Sjogren's Syndrome -continue with Pilocarpine/Plaquenil -follow's up with Dr Terald Sleeper as outpatient  HTN -moderate controll with Lopressor/Cardizem  MVP -follow up with Cards (Dr Clarene Duke) as outpatient-however patient wanting to speak with Dr Maryruth Eve by [facility] this admit  GERD -PPI  Dysphagia -Dysphagia 1 diet-appreciate speech eval  Dementia - on Aricept-given urinary obstruction-will d/c-however  -started on  Namenda this admit  Disposition: SNF when bed  available  DVT Prophylaxis: Prophylactic Lovenox  Code Status: Full Code  Family Communication -spoke to Daughter-Amran [name]-[phone] on 6/5, 6/7, 6/8-updated her  Jeoffrey Massed, MD  [facility] Pager [phone]  If 7PM-7AM, please contact night-coverage [email or web address] Password [email or web address] 07/24/2011, 11:19 AM   LOS: 6 days

## 2011-07-25 DIAGNOSIS — R339 Retention of urine, unspecified: Secondary | ICD-10-CM

## 2011-07-25 DIAGNOSIS — R51 Headache: Secondary | ICD-10-CM

## 2011-07-25 DIAGNOSIS — E782 Mixed hyperlipidemia: Secondary | ICD-10-CM

## 2011-07-25 DIAGNOSIS — E871 Hypo-osmolality and hyponatremia: Secondary | ICD-10-CM

## 2011-07-25 LAB — GLUCOSE, CAPILLARY
Glucose-Capillary: 124 mg/dL — ABNORMAL HIGH (ref 70–99)
Glucose-Capillary: 169 mg/dL — ABNORMAL HIGH (ref 70–99)

## 2011-07-25 MED ORDER — DULOXETINE HCL 20 MG PO CPEP: 40.0000 mg | ORAL_CAPSULE | Freq: Every day | ORAL | Status: DC

## 2011-07-25 MED ORDER — ENSURE PUDDING PO PUDG: 1.0000 | Freq: Three times a day (TID) | ORAL | Status: DC

## 2011-07-25 MED ORDER — ALPRAZOLAM 0.5 MG PO TABS: 0.5000 mg | ORAL_TABLET | Freq: Two times a day (BID) | ORAL | Status: DC | PRN

## 2011-07-25 MED ORDER — INSULIN ASPART 100 UNIT/ML ~~LOC~~ SOLN: SUBCUTANEOUS | Status: DC

## 2011-07-25 MED ORDER — MAGIC MOUTHWASH W/LIDOCAINE: 5.0000 mL | Freq: Four times a day (QID) | ORAL | Status: DC | PRN

## 2011-07-25 MED ORDER — HYDROCODONE-ACETAMINOPHEN 5-500 MG PO TABS: ORAL_TABLET | ORAL | Status: DC

## 2011-07-25 MED ORDER — BUSPIRONE HCL 10 MG PO TABS: 10.0000 mg | ORAL_TABLET | Freq: Three times a day (TID) | ORAL | Status: DC

## 2011-07-25 NOTE — Progress Notes (Signed)
[facility] to be D/C'd Nursing Home per MD order.  Discussed with the patient and all questions fully answered.   Perla, Echavarria  Home Medication Instructions EAV:[id number]   Printed on:07/25/11 1414  Medication Information                    aspirin 81 MG tablet Take 81 mg by mouth daily.             Calcium Carbonate-Vitamin D (CALTRATE 600+D) 600-400 MG-UNIT per tablet Take 2 tablets by mouth daily.             digoxin (LANOXIN) 0.125 MG tablet Take 125 mcg by mouth daily.             Omega-3 Fatty Acids (FISH OIL) 1000 MG CAPS Take 1 capsule by mouth daily.             polyethylene glycol (MIRALAX / GLYCOLAX) packet Take 17 g by mouth daily.             Multiple Vitamin (MULTIVITAMIN) tablet Take 1 tablet by mouth daily.             pilocarpine (SALAGEN) 5 MG tablet Take 5 mg by mouth 2 (two) times daily. As directed by Dr. Corliss Skains            hydroxychloroquine (PLAQUENIL) 200 MG tablet Take 200 mg by mouth daily. As directed by Dr. Corliss Skains            estrogens, conjugated, (PREMARIN) 0.9 MG tablet Take 0.9 mg by mouth daily. Take daily for 21 days then do not take for 7 days.            carboxymethylcellulose (REFRESH TEARS) 0.5 % SOLN 1 drop 2 (two) times daily.             cycloSPORINE (RESTASIS) 0.05 % ophthalmic emulsion Place 1 drop into both eyes every 12 (twelve) hours.            sennosides-docusate sodium (SENOKOT-S) 8.6-50 MG tablet 1-2 tabs by mouth at bedtime            diltiazem (CARDIZEM CD) 180 MG 24 hr capsule Take 1 capsule (180 mg total) by mouth daily.           cyclobenzaprine (FLEXERIL) 10 MG tablet Take 1/2 to 1 tablet by mouth 4 times daily as needed for msucle spasm           lansoprazole (PREVACID) 30 MG capsule Take 1 capsule (30 mg total) by mouth daily.           metoprolol (LOPRESSOR) 50 MG tablet Take 25 mg by mouth 2 (two) times daily.           loteprednol (LOTEMAX) 0.5 % ophthalmic suspension Place 1 drop into both  eyes 2 (two) times daily.           hydroxypropyl methylcellulose (ISOPTO TEARS) 2.5 % ophthalmic solution Place 1 drop into both eyes 4 (four) times daily.           folic acid (FOLVITE) 1 MG tablet Take 2 mg by mouth daily.           gabapentin (NEURONTIN) 100 MG capsule Take 3 capsules (300 mg total) by mouth 3 (three) times daily.           memantine (NAMENDA) 5 MG tablet Take 1 tablet (5 mg total) by mouth 2 (two) times daily.  DULoxetine (CYMBALTA) 20 MG capsule Take 2 capsules (40 mg total) by mouth daily.           Alum & Mag Hydroxide-Simeth (MAGIC MOUTHWASH W/LIDOCAINE) SOLN Take 5 mLs by mouth 4 (four) times daily as needed.           insulin aspart (NOVOLOG) 100 UNIT/ML injection 0-15 Units, Subcutaneous, 3 times daily with meals CBG < 70: implement hypoglycemia protocol CBG 70 - 120: 0 units CBG 121 - 150: 2 units CBG 151 - 200: 3 units CBG 201 - 250: 5 units CBG 251 - 300: 8 units CBG 301 - 350: 11 units CBG 351 - 400: 15 units           HYDROcodone-acetaminophen (VICODIN) 5-500 MG per tablet Take 1 tablet by mouth three times daily as needed for pain--DO NOT EXCEED 3 PER DAY.           feeding supplement (ENSURE) PUDG Take 1 Container by mouth 3 (three) times daily between meals.           busPIRone (BUSPAR) 10 MG tablet Take 1 tablet (10 mg total) by mouth 3 (three) times daily.           ALPRAZolam (XANAX) 0.5 MG tablet Take 1 tablet (0.5 mg total) by mouth 2 (two) times daily as needed. For anxiety             VVS, Skin clean, dry and intact without evidence of skin break down, no evidence of skin tears noted. IV catheter discontinued intact. Site without signs and symptoms of complications. Dressing and pressure applied.   Patient escorted via stretcher, and D/C to [facility] via [facility].  Kennyth Arnold D 07/25/2011 2:14 PM

## 2011-07-25 NOTE — Progress Notes (Signed)
Clinical Social Worker facilitated pt discharge needs including sending pt discharge information via [facility], receiving insurance authorization, but insurance did not initially authorize ambulance transport. Clinical Social Worker discussed with pt family and pt family unable to transport and expressed concern about pt increased confusion and anxiety. Clinical Social Worker discussed with [employer] and insurance authorized ambulance authorization. During discussion with pt family, pt family had a number of medical questions and Clinical Social Worker arranged for RN to clarify questions for pt family. Clinical Social Worker contacted facility and arranged ambulance transportation to [facility]. No further social work needs identified at this time. Clinical Social Worker signing off.  Jacklynn Lewis, MSW, LCSWA  Clinical Social Work [phone]

## 2011-07-25 NOTE — Progress Notes (Signed)
Clinical Social Work Department CLINICAL SOCIAL WORK PLACEMENT NOTE 07/25/2011  Patient:  Ariana [name], Ariana [name]  Account Number:  [id number] Admit date:  07/18/2011  Clinical Social Worker:  Jacelyn Grip  Date/time:  07/24/2011 08:30 AM  Clinical Social Work is seeking post-discharge placement for this patient at the following level of care:   SKILLED NURSING   (*CSW will update this form in Epic as items are completed)   07/24/2011  Patient/family provided with [facility] Department of Clinical Social Work's list of facilities offering this level of care within the geographic area requested by the patient (or if unable, by the patient's family).  07/24/2011  Patient/family informed of their freedom to choose among providers that offer the needed level of care, that participate in Medicare, Medicaid or managed care program needed by the patient, have an available bed and are willing to accept the patient.  07/24/2011  Patient/family informed of [facility]' ownership interest in [facility], as well as of the fact that they are under no obligation to receive care at this facility.  PASARR submitted to EDS on 07/24/2011 PASARR number received from EDS on 07/25/2011  FL2 transmitted to all facilities in geographic area requested by pt/family on  07/24/2011 FL2 transmitted to all facilities within larger geographic area on   Patient informed that his/her managed care company has contracts with or will negotiate with  certain facilities, including the following:     Patient/family informed of bed offers received:  07/24/2011 Patient chooses bed at [facility] Physician recommends and patient chooses bed at    Patient to be transferred to [facility] on  07/25/2011 Patient to be transferred to facility by ambulance  The following physician request were entered in Epic:   Additional Comments:    Jacklynn Lewis, MSW, [facility]  Clinical Social Work [phone]

## 2011-08-01 ENCOUNTER — Other Ambulatory Visit: Payer: Self-pay | Admitting: Pulmonary Disease

## 2011-08-01 MED ORDER — ALPRAZOLAM 0.5 MG PO TABS: 0.5000 mg | ORAL_TABLET | Freq: Two times a day (BID) | ORAL | Status: DC | PRN

## 2011-08-02 ENCOUNTER — Encounter: Payer: Self-pay | Admitting: Pulmonary Disease

## 2011-08-02 ENCOUNTER — Ambulatory Visit (INDEPENDENT_AMBULATORY_CARE_PROVIDER_SITE_OTHER): Payer: Medicare Other | Admitting: Pulmonary Disease

## 2011-08-02 VITALS — BP 138/72 | HR 82 | Temp 96.6°F | Ht 63.5 in

## 2011-08-02 DIAGNOSIS — K219 Gastro-esophageal reflux disease without esophagitis: Secondary | ICD-10-CM

## 2011-08-02 DIAGNOSIS — N390 Urinary tract infection, site not specified: Secondary | ICD-10-CM

## 2011-08-02 DIAGNOSIS — I10 Essential (primary) hypertension: Secondary | ICD-10-CM

## 2011-08-02 DIAGNOSIS — IMO0001 Reserved for inherently not codable concepts without codable children: Secondary | ICD-10-CM

## 2011-08-02 DIAGNOSIS — F0390 Unspecified dementia without behavioral disturbance: Secondary | ICD-10-CM

## 2011-08-02 DIAGNOSIS — G589 Mononeuropathy, unspecified: Secondary | ICD-10-CM

## 2011-08-02 DIAGNOSIS — M3500 Sicca syndrome, unspecified: Secondary | ICD-10-CM

## 2011-08-02 DIAGNOSIS — M545 Low back pain, unspecified: Secondary | ICD-10-CM

## 2011-08-02 DIAGNOSIS — K5730 Diverticulosis of large intestine without perforation or abscess without bleeding: Secondary | ICD-10-CM

## 2011-08-02 DIAGNOSIS — K589 Irritable bowel syndrome without diarrhea: Secondary | ICD-10-CM

## 2011-08-02 DIAGNOSIS — F341 Dysthymic disorder: Secondary | ICD-10-CM

## 2011-08-02 DIAGNOSIS — R339 Retention of urine, unspecified: Secondary | ICD-10-CM

## 2011-08-02 MED ORDER — HYDROCORTISONE 2.5 % RE CREA: TOPICAL_CREAM | RECTAL | Status: DC

## 2011-08-02 NOTE — Patient Instructions (Addendum)
Today we updated your med list in our EPIC system...    Continue your current medications the same (see below)...  For your constipation:    INCREASE the MIRALAX to twice daily...    CONTINUE the SENAKOT-S 2 tabs at bedtime...    MONITOR your BMs & call if you go 3d without a movement...    Use the new ANUSOL HC Cream> apply to rectal area after each BM...  STOP the Accuchecks and the Sliding Scale Insulin coverage...  Change the diet to a "NO CONCENTRATED SWEETS" - diabetic type diet...  Follow the directions of the Urologists for removal of the Foley catheter...  We will arrange for an outpatient Psychiatric follow up for med management for the meds started by the Psychiatrist in the hospital...  Let's plan a follow up OV in 1 month.Marland KitchenMarland Kitchen

## 2011-08-06 ENCOUNTER — Encounter: Payer: Self-pay | Admitting: Pulmonary Disease

## 2011-08-06 NOTE — Progress Notes (Signed)
Subjective:    Patient ID: Ariana [name], female    DOB: [birth year], 77 y.o.   MRN: [id number]  HPI 78 y/o WF here for a follow up visit... he has multiple medical problems as noted below...   ~  May11:  she recently saw DrDeveshwar for Rheum w/ extensive eval & labs done> Dx w/ Sjogren's syndrome/ autoimmune disease... her note is pending but pt indicates new Rx w/ Pilocarpine 5mg Bid, and considering Plaquenil rx...  she saw DrHecker 2/11 & DrLittle 1/11 - see below... she is c/o incr neuropathy symptoms in her legs and wonders what can be done for this> she is on Neurontin from DrHickling at 100mg Tid & we discussed incr to 300mg Bid... ~  LOV56:  she states she was dx w/ "autoimmune disease" by Rheum DrDeveshwar & started on Plaquenil 200mg /d now- notes from 7/11 & 10/11 reviewed... c/o abd bloating/ gas treated by GI DrGessner w/ Prevacid, Miralax, Senakot-S, OTC Simethacone (Mylicon/ Phazyme)- we discussed trial Bentyl for cramping... BP remains controlled;  denies CP, palpit, ch in SOB, etc;  notes persistant back discomfort. aches/ pains & wants Vicodin refilled;  she remains on Neurontin for her neuropathy, and Alpraz for nerves...  ~  Jul 08, 2010:  47mo ROV & she is here w/ her daughter> "it's been rough" c/o incr pain in right side of low back, right hip, right leg- prev eval DrMortenson & we discussed need for Ortho re-evaluation; she is seen regularly (every 6+mo) by Rober Minion for Rheum- Sjogren's syn, ?FM, LBP, Neuropathy> on Plaquenil, Vicodin, Flexeril, Neurontin, etc...  From the medical standpoint> BP controlled; GI stable on meds; under a lot of stress & memory loss symptoms about the same; hx mult drug side effects... Labs today look OK x elev TG=202 & advised better low fat diet...  ~  January 13, 2011:  47mo ROV & she is here w/ relative again and there is come conflict regarding her care needs etc; they mentioned DrRendall eval & attempts to get PT eval, had home care assessment  but I really didn't understand where the problem lied- ?she wanted maid service, of course medicare doesn't provide this, etc;  After long conversation we left it at calling [facility] for home PT/ OT if indeed she is eligible; they make note of the fact that DrDeveshwar is for the autoimmune dis & DrRendall is for the DDD, leg pain & he prev ordered the "therapy"; recall that she prev saw DrHickling for the Neuropathy & he started the Gabapentin...      From the medical perspective> BP well regulated on ToprolXL & Diltiazem; she denies CP, palpit, SOB or edema- ?when she saw DrLittle last & still on ASA/ Lanoxin under his direction & she denies cerebral ischemic symptoms; still trying to control lipids w/ diet alone (see below); GI stable on meds listed;  Her CC is her Sjogren's, and Neuropathy which she says is coming from her back per DrRendall- rec to incr the Neurontin back to 300mg  Bid;  Family is concerned about her depression & she no longer sees DrCottle (angry talking about their last meeting)> offered ZOLOFT 25-50mg  daily as a trial & they agree...  ~  Jul 14, 2011:  47mo ROV & Ariana [name] is here w/ her daughter who is her caregiver; there is much conflict w/ pt being argumentative & set in her ways, resisting help from daugh, etc; especially regarding her meds> daughter notes several meds caused reactions- staggering, hallucinations, confusion (eg- Zoloft, Klonopin); pt takes Xanax  as needed (admin by family) & she is really angry about not getting more pain medicine when she wants it; we discussed these issues but she has early dementia & doesn't "get-it", daugh requested Alz med & we will start DONEPIZIL 5mg /d for 45mo the 10mg /d thereafter...  Pt spent an inordinate amt of time ranting about her teeth, dentures, implants, TMJ, her severe dry mouth, etc...    We reviewed prob list, meds, xrays and labs> see below>> LABS 5/13:  Chems- ok w/ BS=105 Na=132;  CBC- wnl;  Fe=123 (30%sat);  TSH=1.56; Urine- +UTI ==>  we will Rx w/ Cipro250Bid...  ~  August 02, 2011:  3wk [place] & post hospital visit>  She was Adm 6/4 - 07/25/11 by [facility] w/ UTI & urinary retension, altered mental status, & her mult other medical problems;  Her urine was abn but no growth on cult & she improved w/ Ceftin;  She had a foley placed but this was not discontinued at disch to the rehab facility- she was sent w/ it & has had f/u w/ Urology for removal;  She had just been started on Aricept (but hadn't yet taken her 1st pill) & was switched to Namenda 5mg Bid;  She was seen by Psychiatry DrBogard and started on Buspar & Cymbalta- but they do not see outpts so we will have to refer to an office based Psychiatrist for on-going help;  She was also disch on SSI but her sugars were only minimally elev & A1c=5.7, and we will stop the insulin & monitor sugars on diet alone...      We reviewed prob list, meds, xrays and labs> see below>> CXR 6/13 showed atx & scarring LLL, NAD... CT Brain 6/13 showed cortical atrophy, NAD... CT Abd 6/13 showed distented bladder, constip, s/p hyst, left renal cyst, diverticulosis & some wall thickening in transverse colon... EKG 6/13 showed NSR, rate74, LAD, minor NSSTTWA... LABS 6/13:  CBC- Hg=11-12, WBC=6-7K;  Sed=16;  Chems- ok x elev BS (90-160), A1c=5.7;  FLP- at goals;  TFTs= wnl...          Problem List:  SJOGREN'S SYNDROME (ICD-710.2), & DRY EYE SYNDROME (ICD-375.15) - eval by DrHecker & DrDeveshwar 5/11 w/ extensive lab work done & Dx of Sjogren's syndrome & started on Pilocarpine 5mg Bid... DrHecker has her on a number of eye drops, & prev thought to be dry eyes w/ ocular rosacea & tubes placed in tear ducts... also seen by Mikey Kirschner...  ~  10/11:  DrDeveshwar started PLAQUENIL 200mg /d... Seen every 32mo by Rheum. ~  Most recent note from [facility] 4/13> Sjogren's, Raynaud's, LBP, musc spasms, on Plaquenil Rx & no changes made.  CHRONIC ORAL DISCOMFORT - this has been looked into by mult physicians and no  other etiology discovered (other than her sicca syndrome)... she uses Magic Mouthwash Prn + sialogogues...  HYPERTENSION, BENIGN (ICD-401.1) - controlled on TOPROL XL 50mg - 1/2 tabBid, DILTIAZEM CD 180mg /d, & LASIX 20mg  Qod...   ~  5/12: BP= 134/80 today, takes meds regularly & tol well... denies CP, worsening palipit, syncope, dyspnea, edema, etc... ~  11/12: BP= 122/60 & she remains asymptomatic... ~  5/13:  BP= 122/70 & she denies CP, palpit, change in SOB, edema, etc...  MITRAL VALVE PROLAPSE (ICD-424.0) - on ASA 81mg /d, & LANOXIN 0.125mg /d and followed by DrLittle for Cardiology... his notes are reviewed> MVP, HBP, hx palpit, neg cath... ~  cath 1/05 by DrLittle showed clean coronaries & norm LVF w/ +MVP... ~  1/11:  Seen by  DrLittle for f/u MVP & palpit; BP was sl elev & he rec incr Metoprolol to 25mg  Bid.  HYPERLIPIDEMIA>  On low chol, low fat diet alone... She takes Fish Oil, flax seed oil, etc. ~  2008:  FLP showed TChol 170, TG 176, HDL 50,  ~  5/12:  FLP showed TChol 142, TG 202, HDL 62, LDL 166 ~  FLP 6/13 showed TChol 128, TG 56, HDL 61, LDL 56  Elev Blood Sugar >> occurred during the 6/13 hosp & covered by SSI during the hosp & sent to rehab center w/ this==> discontinued on office follow up...  GERD (ICD-530.81) - on PREVACID 30mg /d... denies reflux symptoms, swallowing OK x for dryness, etc... ~  last EGD 9/02 showed 4cmHH, GERD...  DIVERTICULOSIS OF COLON (ICD-562.10),  IRRITABLE BOWEL SYNDROME (ICD-564.1), & Family Hx of COLON CANCER (ICD-153.9) - followed for GI by DrSamLeBauer- now DrGessner- colonoscopy 2/06 showed a tortuous and spastic colon, divertics, otherw neg... her sister died in her 33's w/ colon cancer... f/u colonoscopy 4/10 showed mod divertics, hems, melanosis> Rx'd w/ Miralax/ Senakot-S. ~  11/11:  she notes incr IBS symptoms- rec Rx w/ BENTYL 20mg Qid Prn, Mylicon, Phazyme ==> improved.  URINARY INCONTINENCE (ICD-788.30) - eval by GYN= DrRNeal (on Premarin  0.9mg /d)...  LOW BACK PAIN SYNDROME (ICD-724.2) - on VICODIN up to 3/ day... Ortho= DrMortenson, Rendall, et al... ~  5/12:  She is c/o incr pain in right side of back/ hip/ leg> refer to Ortho for further eval; Rx Vicodin prn...  FIBROMYALGIA (ICD-729.1) - she has chr fatigue symptoms and diffuse aches & pains... she has been eval by Rheum DrDeveshwar w/ recent dx of Sjogren's Syndrome & DDD- on CYCLOBENZAPRINE 10mg - 1/2-1 tab Prn...  MEMORY LOSS (ICD-780.93) - eval by DrHickling for Neurology w/ MRI done- pt states "it's not dementia, just hardening of the arteries"... his note from 4/08 indicates- OBS, gait abn & neck/ Back pain diagnoses... ~  5/13:  Daughter notes more difficulty & ready to start medication ==> Aricept 5mg /d was recommended but never started by the pt... ~  6/13:  The hospitalists started [facility] 5mg Bid in lieu of the Aricept...  NEUROPATHY (ICD-355.9) - on GABAPENTIN 100mg Tid w/ some relief; initially started on larger dose (300mg Bid) per DrHickling, but she was intol w/ confusion & slurred speech... neuropathy symptoms improved on Rx.  ANXIETY DEPRESSION (ICD-300.4) - she was started on BUSPAR10mg Tid & CYMBALTA 20mg - 2daily during the 6/13 Hosp; prev on Alpraz0.5mg  as needed... prev Psychiatric Rx from DrCottle but she hasn't seen him in >54yr & refuses to return... Family requested med for depression & we tried Zoloft 25 to 50mg  /d but they stopped it due to affect on her gait & speech... Note> Klonopin in past caused hallucinations, but she tolerates the intermittent Xanax ok... ~  6/13:  She was seen by DrBogard, Psychiatrist during the 6/13 hosp & will be referred for outpt follow up...  Hx of ADVERSE DRUG REACTION (ICD-995.20) - hx of mult drug allergies and intolerances including:  PCN, Sulfa, Doxy, NSAIDs, Demerol, Codeine, DCN100, Tramadol, Phenergan, Thorazine, Stelazine...  NOTE: she takes Vicodin for pain, and tolerates this satis...  Health Maintenance - GYN=  DrNeal on Premarin, Calcium, Vits, & Fish Oil...   Past Surgical History  Procedure Date  . Vesicovaginal fistula closure w/ tah   . Appendectomy   . Cholecystectomy   . Mandible surgery   . Temporomandibular joint surgery   . Cataract extraction, bilateral   . Abdominal hysterectomy   .  Dental surgery     multiple tooth extractions    Outpatient Encounter Prescriptions as of 08/02/2011  Medication Sig Dispense Refill  . ALPRAZolam (XANAX) 0.5 MG tablet Take 1 tablet (0.5 mg total) by mouth 2 (two) times daily as needed for anxiety. For anxiety  60 tablet  5  . Alum & Mag Hydroxide-Simeth (MAGIC MOUTHWASH W/LIDOCAINE) SOLN Take 5 mLs by mouth 4 (four) times daily as needed.      Marland Kitchen aspirin 81 MG tablet Take 81 mg by mouth daily.        . busPIRone (BUSPAR) 10 MG tablet Take 1 tablet (10 mg total) by mouth 3 (three) times daily.  15 tablet  0  . Calcium Carbonate-Vitamin D (CALTRATE 600+D) 600-400 MG-UNIT per tablet Take 2 tablets by mouth daily.        . carboxymethylcellulose (REFRESH TEARS) 0.5 % SOLN 1 drop 2 (two) times daily.        . cyclobenzaprine (FLEXERIL) 10 MG tablet Take 1/2 to 1 tablet by mouth 4 times daily as needed for msucle spasm  120 tablet  5  . cycloSPORINE (RESTASIS) 0.05 % ophthalmic emulsion Place 1 drop into both eyes every 12 (twelve) hours.       . digoxin (LANOXIN) 0.125 MG tablet Take 125 mcg by mouth daily.        Marland Kitchen diltiazem (CARDIZEM CD) 180 MG 24 hr capsule Take 1 capsule (180 mg total) by mouth daily.  30 capsule  11  . DULoxetine (CYMBALTA) 20 MG capsule Take 2 capsules (40 mg total) by mouth daily.      Marland Kitchen estrogens, conjugated, (PREMARIN) 0.9 MG tablet Take 0.9 mg by mouth daily. Take daily for 21 days then do not take for 7 days.       . feeding supplement (ENSURE) PUDG Take 1 Container by mouth 3 (three) times daily between meals.      . folic acid (FOLVITE) 1 MG tablet Take 2 mg by mouth daily.      Marland Kitchen gabapentin (NEURONTIN) 100 MG capsule Take 3  capsules (300 mg total) by mouth 3 (three) times daily.  90 capsule  5  . HYDROcodone-acetaminophen (VICODIN) 5-500 MG per tablet Take 1 tablet by mouth three times daily as needed for pain--DO NOT EXCEED 3 PER DAY.  15 tablet  0  . hydrocortisone (ANUSOL-HC) 2.5 % rectal cream Apply to rectal area after each BM  30 g  11  . hydroxychloroquine (PLAQUENIL) 200 MG tablet Take 200 mg by mouth daily. As directed by Dr. Corliss Skains       . hydroxypropyl methylcellulose (ISOPTO TEARS) 2.5 % ophthalmic solution Place 1 drop into both eyes 4 (four) times daily.      . lansoprazole (PREVACID) 30 MG capsule Take 1 capsule (30 mg total) by mouth daily.  30 capsule  11  . loteprednol (LOTEMAX) 0.5 % ophthalmic suspension Place 1 drop into both eyes 2 (two) times daily.      . memantine (NAMENDA) 5 MG tablet Take 1 tablet (5 mg total) by mouth 2 (two) times daily.  60 tablet  0  . metoprolol (LOPRESSOR) 50 MG tablet Take 25 mg by mouth 2 (two) times daily.      . Multiple Vitamin (MULTIVITAMIN) tablet Take 1 tablet by mouth daily.        . Omega-3 Fatty Acids (FISH OIL) 1000 MG CAPS Take 1 capsule by mouth daily.        . pilocarpine (SALAGEN) 5  MG tablet Take 5 mg by mouth 2 (two) times daily. As directed by Dr. Corliss Skains       . polyethylene glycol (MIRALAX / GLYCOLAX) packet Take 17 g by mouth 2 (two) times daily.       . sennosides-docusate sodium (SENOKOT-S) 8.6-50 MG tablet Take 2 tablets by mouth at bedtime.       Marland Kitchen DISCONTD: hydrocortisone (ANUSOL-HC) 2.5 % rectal cream Apply to rectal area after each BM      . DISCONTD: insulin aspart (NOVOLOG) 100 UNIT/ML injection 0-15 Units, Subcutaneous, 3 times daily with meals CBG < 70: implement hypoglycemia protocol CBG 70 - 120: 0 units CBG 121 - 150: 2 units CBG 151 - 200: 3 units CBG 201 - 250: 5 units CBG 251 - 300: 8 units CBG 301 - 350: 11 units CBG 351 - 400: 15 units  1 vial      Allergies  Allergen Reactions  . Codeine Nausea Only    unless  given with Phenergan  . Doxycycline     Unknown  . Klonopin (Clonazepam)     Causes hallucination   . Meperidine Hcl Nausea Only    unless given with Phenergan  . Naproxen   . Norflex (Orphenadrine Citrate) Nausea Only    Unless given with Phenergan  . Oxycodone-Acetaminophen Nausea Only    unless given with phenergan  . Penicillins     Unknown  . Phenothiazines     Unknown  . Propoxyphene Hcl Nausea Only    unless given with phenergan  . Stelazine     Unknown  . Sulfamethoxazole W-Trimethoprim     Unknown  . Tolectin (Tolmetin Sodium)     Unknown  . Tramadol     Unknown  . Zoloft (Sertraline Hcl)     Caused pt to sleep a lot  . Lubiprostone Rash    Current Medications, Allergies, Past Medical History, Past Surgical History, Family History, and Social History were reviewed in [employer] record.    Review of Systems        See HPI - all other systems neg except as noted... The patient complains of decreased hearing, hoarseness, dyspnea on exertion, and abdominal pain.  The patient denies anorexia, fever, weight loss, weight gain, vision loss, chest pain, syncope, peripheral edema, prolonged cough, headaches, hemoptysis, melena, hematochezia, severe indigestion/heartburn, hematuria, incontinence, muscle weakness, suspicious skin lesions, transient blindness, difficulty walking, depression, unusual weight change, abnormal bleeding, enlarged lymph nodes, and angioedema.     Objective:   Physical Exam      WD, WN, 78 y/o WF in NAD... she has dry eyes & prominent dry mouth symptoms... GENERAL:  Alert & oriented; pleasant & cooperative...  HEENT:  [place]/AT, EOM-wnl, PERRLA, EACs-clear, TMs-wnl, NOSE-clear, THROAT-clear & excessively dry. NECK:  Supple w/ fairROM; no JVD; normal carotid impulses w/o bruits; no thyromegaly or nodules palpated; no lymphadenopathy. CHEST:  Clear to P & A; without wheezes/ rales/ or rhonchi. HEART:  Regular Rhythm; without  murmurs/ rubs/ or gallops heard... ABDOMEN:  Soft & nontender; normal bowel sounds; no organomegaly or masses detected. EXT: without deformities, mild arthritic changes; no varicose veins/ venous insuffic/ or edema. NEURO:  CN's intact;  no focal neuro deficits... DERM:  No lesions noted; no rash etc...  RADIOLOGY DATA:  Reviewed in the EPIC EMR & discussed w/ the patient...  LABORATORY DATA:  Reviewed in the EPIC EMR & discussed w/ the patient...   Assessment & Plan:   UTI / Urinary  Retension>  She was hosp 6/13 by [facility]- UTI resolved w/ Ab & Foley managed by Urology, DrOttelin & she has appt to have it removed...   SJOGREN's>  Still w/ very dry Sicca syndrome & medication from DrDeveshwar, Ophthalmology, etc...  HBP>  Controlled on meds, continue same...  MVP>  Followed by DrLittle for Cards, & stable on meds above...  HYPERLIPID>  TG sl elev & rec to get on better low fat diet...  GI>  GERD, HH, Divertics, IBS, +FemHx colon ca>  Stable on Prev30, last colon 2010- OK, Bentyl helps...  Chronic Pain Syndrome>  She states adeq control w/ Vicodin Rx...  Memory loss/ anxiety/ depression>  Prev eval by Neuro- DrHickling, DrCottle for psyche in past;  During the 6/13 [facility]- she was seen by DrBogard for  Psyche & placed on Buspar & Cymbalta; needs outpt f/u by Psyche...   Patient's Medications  New Prescriptions   No medications on file  Previous Medications   ALPRAZOLAM (XANAX) 0.5 MG TABLET    Take 1 tablet (0.5 mg total) by mouth 2 (two) times daily as needed for anxiety. For anxiety   ALUM & MAG HYDROXIDE-SIMETH (MAGIC MOUTHWASH W/LIDOCAINE) SOLN    Take 5 mLs by mouth 4 (four) times daily as needed.   ASPIRIN 81 MG TABLET    Take 81 mg by mouth daily.     BUSPIRONE (BUSPAR) 10 MG TABLET    Take 1 tablet (10 mg total) by mouth 3 (three) times daily.   CALCIUM CARBONATE-VITAMIN D (CALTRATE 600+D) 600-400 MG-UNIT PER TABLET    Take 2 tablets by mouth daily.      CARBOXYMETHYLCELLULOSE (REFRESH TEARS) 0.5 % SOLN    1 drop 2 (two) times daily.     CYCLOBENZAPRINE (FLEXERIL) 10 MG TABLET    Take 1/2 to 1 tablet by mouth 4 times daily as needed for msucle spasm   CYCLOSPORINE (RESTASIS) 0.05 % OPHTHALMIC EMULSION    Place 1 drop into both eyes every 12 (twelve) hours.    DIGOXIN (LANOXIN) 0.125 MG TABLET    Take 125 mcg by mouth daily.     DILTIAZEM (CARDIZEM CD) 180 MG 24 HR CAPSULE    Take 1 capsule (180 mg total) by mouth daily.   DULOXETINE (CYMBALTA) 20 MG CAPSULE    Take 2 capsules (40 mg total) by mouth daily.   ESTROGENS, CONJUGATED, (PREMARIN) 0.9 MG TABLET    Take 0.9 mg by mouth daily. Take daily for 21 days then do not take for 7 days.    FEEDING SUPPLEMENT (ENSURE) PUDG    Take 1 Container by mouth 3 (three) times daily between meals.   FOLIC ACID (FOLVITE) 1 MG TABLET    Take 2 mg by mouth daily.   GABAPENTIN (NEURONTIN) 100 MG CAPSULE    Take 3 capsules (300 mg total) by mouth 3 (three) times daily.   HYDROCODONE-ACETAMINOPHEN (VICODIN) 5-500 MG PER TABLET    Take 1 tablet by mouth three times daily as needed for pain--DO NOT EXCEED 3 PER DAY.   HYDROXYCHLOROQUINE (PLAQUENIL) 200 MG TABLET    Take 200 mg by mouth daily. As directed by Dr. Corliss Skains    HYDROXYPROPYL METHYLCELLULOSE (ISOPTO TEARS) 2.5 % OPHTHALMIC SOLUTION    Place 1 drop into both eyes 4 (four) times daily.   LANSOPRAZOLE (PREVACID) 30 MG CAPSULE    Take 1 capsule (30 mg total) by mouth daily.   LOTEPREDNOL (LOTEMAX) 0.5 % OPHTHALMIC SUSPENSION    Place 1 drop  into both eyes 2 (two) times daily.   MEMANTINE (NAMENDA) 5 MG TABLET    Take 1 tablet (5 mg total) by mouth 2 (two) times daily.   METOPROLOL (LOPRESSOR) 50 MG TABLET    Take 25 mg by mouth 2 (two) times daily.   MULTIPLE VITAMIN (MULTIVITAMIN) TABLET    Take 1 tablet by mouth daily.     OMEGA-3 FATTY ACIDS (FISH OIL) 1000 MG CAPS    Take 1 capsule by mouth daily.     PILOCARPINE (SALAGEN) 5 MG TABLET    Take 5 mg by  mouth 2 (two) times daily. As directed by Dr. Corliss Skains    POLYETHYLENE GLYCOL [facility]) PACKET    Take 17 g by mouth 2 (two) times daily.    SENNOSIDES-DOCUSATE SODIUM (SENOKOT-S) 8.6-50 MG TABLET    Take 2 tablets by mouth at bedtime.   Modified Medications   Modified Medication Previous Medication   HYDROCORTISONE (ANUSOL-HC) 2.5 % RECTAL CREAM hydrocortisone (ANUSOL-HC) 2.5 % rectal cream      Apply to rectal area after each BM    Apply to rectal area after each BM  Discontinued Medications   INSULIN ASPART (NOVOLOG) 100 UNIT/ML INJECTION    0-15 Units, Subcutaneous, 3 times daily with meals CBG < 70: implement hypoglycemia protocol CBG 70 - 120: 0 units CBG 121 - 150: 2 units CBG 151 - 200: 3 units CBG 201 - 250: 5 units CBG 251 - 300: 8 units CBG 301 - 350: 11 units CBG 351 - 400: 15 units

## 2011-08-31 ENCOUNTER — Ambulatory Visit (INDEPENDENT_AMBULATORY_CARE_PROVIDER_SITE_OTHER): Payer: Medicare Other | Admitting: Pulmonary Disease

## 2011-08-31 ENCOUNTER — Encounter: Payer: Self-pay | Admitting: Pulmonary Disease

## 2011-08-31 VITALS — BP 122/70 | HR 62 | Temp 97.0°F | Ht 63.5 in | Wt 115.6 lb

## 2011-08-31 DIAGNOSIS — F341 Dysthymic disorder: Secondary | ICD-10-CM

## 2011-08-31 DIAGNOSIS — I059 Rheumatic mitral valve disease, unspecified: Secondary | ICD-10-CM

## 2011-08-31 DIAGNOSIS — G589 Mononeuropathy, unspecified: Secondary | ICD-10-CM

## 2011-08-31 DIAGNOSIS — IMO0001 Reserved for inherently not codable concepts without codable children: Secondary | ICD-10-CM

## 2011-08-31 DIAGNOSIS — F0390 Unspecified dementia without behavioral disturbance: Secondary | ICD-10-CM

## 2011-08-31 DIAGNOSIS — K589 Irritable bowel syndrome without diarrhea: Secondary | ICD-10-CM

## 2011-08-31 DIAGNOSIS — K219 Gastro-esophageal reflux disease without esophagitis: Secondary | ICD-10-CM

## 2011-08-31 DIAGNOSIS — M545 Low back pain, unspecified: Secondary | ICD-10-CM

## 2011-08-31 DIAGNOSIS — K5730 Diverticulosis of large intestine without perforation or abscess without bleeding: Secondary | ICD-10-CM

## 2011-08-31 DIAGNOSIS — I10 Essential (primary) hypertension: Secondary | ICD-10-CM

## 2011-08-31 DIAGNOSIS — E7800 Pure hypercholesterolemia, unspecified: Secondary | ICD-10-CM

## 2011-08-31 DIAGNOSIS — M3500 Sicca syndrome, unspecified: Secondary | ICD-10-CM

## 2011-08-31 NOTE — Progress Notes (Signed)
Subjective:    Patient ID: Ariana [name], female    DOB: [birth year], 77 y.o.   MRN: [id number]  HPI 78 y/o WF here for a follow up visit... he has multiple medical problems as noted below...   ~  May11:  she recently saw DrDeveshwar for Rheum w/ extensive eval & labs done> Dx w/ Sjogren's syndrome/ autoimmune disease... her note is pending but pt indicates new Rx w/ Pilocarpine 5mg Bid, and considering Plaquenil rx...  she saw DrHecker 2/11 & DrLittle 1/11 - see below... she is c/o incr neuropathy symptoms in her legs and wonders what can be done for this> she is on Neurontin from DrHickling at 100mg Tid & we discussed incr to 300mg Bid... ~  LOV56:  she states she was dx w/ "autoimmune disease" by Rheum DrDeveshwar & started on Plaquenil 200mg /d now- notes from 7/11 & 10/11 reviewed... c/o abd bloating/ gas treated by GI DrGessner w/ Prevacid, Miralax, Senakot-S, OTC Simethacone (Mylicon/ Phazyme)- we discussed trial Bentyl for cramping... BP remains controlled;  denies CP, palpit, ch in SOB, etc;  notes persistant back discomfort. aches/ pains & wants Vicodin refilled;  she remains on Neurontin for her neuropathy, and Alpraz for nerves...  ~  Jul 08, 2010:  47mo ROV & she is here w/ her daughter> "it's been rough" c/o incr pain in right side of low back, right hip, right leg- prev eval DrMortenson & we discussed need for Ortho re-evaluation; she is seen regularly (every 6+mo) by Rober Minion for Rheum- Sjogren's syn, ?FM, LBP, Neuropathy> on Plaquenil, Vicodin, Flexeril, Neurontin, etc...  From the medical standpoint> BP controlled; GI stable on meds; under a lot of stress & memory loss symptoms about the same; hx mult drug side effects... Labs today look OK x elev TG=202 & advised better low fat diet...  ~  January 13, 2011:  47mo ROV & she is here w/ relative again and there is come conflict regarding her care needs etc; they mentioned DrRendall eval & attempts to get PT eval, had home care assessment  but I really didn't understand where the problem lied- ?she wanted maid service, of course medicare doesn't provide this, etc;  After long conversation we left it at calling [facility] for home PT/ OT if indeed she is eligible; they make note of the fact that DrDeveshwar is for the autoimmune dis & DrRendall is for the DDD, leg pain & he prev ordered the "therapy"; recall that she prev saw DrHickling for the Neuropathy & he started the Gabapentin...      From the medical perspective> BP well regulated on ToprolXL & Diltiazem; she denies CP, palpit, SOB or edema- ?when she saw DrLittle last & still on ASA/ Lanoxin under his direction & she denies cerebral ischemic symptoms; still trying to control lipids w/ diet alone (see below); GI stable on meds listed;  Her CC is her Sjogren's, and Neuropathy which she says is coming from her back per DrRendall- rec to incr the Neurontin back to 300mg  Bid;  Family is concerned about her depression & she no longer sees DrCottle (angry talking about their last meeting)> offered ZOLOFT 25-50mg  daily as a trial & they agree...  ~  Jul 14, 2011:  47mo ROV & Ariana [name] is here w/ her daughter who is her caregiver; there is much conflict w/ pt being argumentative & set in her ways, resisting help from daugh, etc; especially regarding her meds> daughter notes several meds caused reactions- staggering, hallucinations, confusion (eg- Zoloft, Klonopin); pt takes Xanax  as needed (admin by family) & she is really angry about not getting more pain medicine when she wants it; we discussed these issues but she has early dementia & doesn't "get-it", daugh requested Alz med & we will start DONEPIZIL 5mg /d for 540mo the 10mg /d thereafter...  Pt spent an inordinate amt of time ranting about her teeth, dentures, implants, TMJ, her severe dry mouth, etc...    We reviewed prob list, meds, xrays and labs> see below>> LABS 5/13:  Chems- ok w/ BS=105 Na=132;  CBC- wnl;  Fe=123 (30%sat);  TSH=1.56; Urine- +UTI ==>  we will Rx w/ Cipro250Bid...  ~  August 02, 2011:  3wk [place] & post hospital visit>  She was Adm 6/4 - 07/25/11 by [facility] w/ UTI & urinary retension, altered mental status, & her mult other medical problems;  Her urine was abn but no growth on cult & she improved w/ Ceftin;  She had a foley placed but this was not discontinued at disch to the rehab facility- she was sent w/ it & has had f/u w/ Urology for removal;  She had just been started on Aricept (but hadn't yet taken her 1st pill) & was switched to Namenda 5mg Bid;  She was seen by Psychiatry DrBogard and started on Buspar & Cymbalta- but they do not see outpts so we will have to refer to an office based Psychiatrist for on-going help;  She was also disch on SSI but her sugars were only minimally elev & A1c=5.7, and we will stop the insulin & monitor sugars on diet alone...      We reviewed prob list, meds, xrays and labs> see below>> CXR 6/13 showed atx & scarring LLL, NAD... CT Brain 6/13 showed cortical atrophy, NAD... CT Abd 6/13 showed distented bladder, constip, s/p hyst, left renal cyst, diverticulosis & some wall thickening in transverse colon... EKG 6/13 showed NSR, rate74, LAD, minor NSSTTWA... LABS 6/13:  CBC- Hg=11-12, WBC=6-7K;  Sed=16;  Chems- ok x elev BS (90-160), A1c=5.7;  FLP- at goals;  TFTs= wnl...  ~  August 31, 2011:  540mo ROV & Vora feels like she's a new person- improving, getting therapy, not talking to herself anymore; unfortunately she has been unable to see an outpt psychiatrist & asking social services at [facility] for [facility]...   We reviewed prob list, meds, xrays and labs> see below>>            Problem List:  SJOGREN'S SYNDROME (ICD-710.2), & DRY EYE SYNDROME (ICD-375.15) - eval by DrHecker & DrDeveshwar 5/11 w/ extensive lab work done & Dx of Sjogren's syndrome & started on Pilocarpine 5mg Bid... DrHecker has her on a number of eye drops, & prev thought to be dry eyes w/ ocular rosacea & tubes placed in tear ducts...  also seen by Mikey Kirschner...  ~  10/11:  DrDeveshwar started PLAQUENIL 200mg /d... Seen every 54mo by Rheum. ~  Most recent note from [facility] 4/13> Sjogren's, Raynaud's, LBP, musc spasms, on Plaquenil Rx & no changes made.  CHRONIC ORAL DISCOMFORT - this has been looked into by mult physicians and no other etiology discovered (other than her sicca syndrome)... she uses Magic Mouthwash Prn + sialogogues...  HYPERTENSION, BENIGN (ICD-401.1) - controlled on TOPROL XL 50mg - 1/2 tabBid, DILTIAZEM CD 180mg /d, & LASIX 20mg  Qod...   ~  5/12: BP= 134/80 today, takes meds regularly & tol well... denies CP, worsening palipit, syncope, dyspnea, edema, etc... ~  11/12: BP= 122/60 & she remains asymptomatic... ~  5/13:  BP= 122/70 &  she denies CP, palpit, change in SOB, edema, etc... ~  CXR 6/13 showed stable bronchitic changes w/ thickening of airways, scarring at lung bases, no acute changes.  MITRAL VALVE PROLAPSE (ICD-424.0) - on ASA 81mg /d, & LANOXIN 0.125mg /d and followed by DrLittle for Cardiology... his notes are reviewed> MVP, HBP, hx palpit, neg cath... ~  cath 1/05 by DrLittle showed clean coronaries & norm LVF w/ +MVP... ~  1/11:  Seen by DrLittle for f/u MVP & palpit; BP was sl elev & he rec incr Metoprolol to 25mg  Bid.  HYPERLIPIDEMIA>  On low chol, low fat diet alone... She takes Fish Oil, flax seed oil, etc. ~  2008:  FLP showed TChol 170, TG 176, HDL 50,  ~  5/12:  FLP showed TChol 142, TG 202, HDL 62, LDL 166 ~  FLP 6/13 showed TChol 128, TG 56, HDL 61, LDL 56  Elev Blood Sugar >> occurred during the 6/13 hosp & covered by SSI during the hosp & sent to rehab center w/ this==> discontinued on office follow up...  GERD (ICD-530.81) - on PREVACID 30mg /d... denies reflux symptoms, swallowing OK x for dryness, etc... ~  last EGD 9/02 showed 4cmHH, GERD...  DIVERTICULOSIS OF COLON (ICD-562.10),  IRRITABLE BOWEL SYNDROME (ICD-564.1), & Family Hx of COLON CANCER (ICD-153.9) - followed for GI  by DrSamLeBauer- now DrGessner- colonoscopy 2/06 showed a tortuous and spastic colon, divertics, otherw neg... her sister died in her 32's w/ colon cancer... f/u colonoscopy 4/10 showed mod divertics, hems, melanosis> Rx'd w/ Miralax/ Senakot-S. ~  11/11:  she notes incr IBS symptoms- rec Rx w/ BENTYL 20mg Qid Prn, Mylicon, Phazyme ==> improved.  URINARY INCONTINENCE (ICD-788.30) - eval by GYN= DrRNeal (on Premarin 0.9mg /d)... ~  7/13:  Foley discontinued, voiding better w/ freq trips to the commode to prevent leaking...  LOW BACK PAIN SYNDROME (ICD-724.2) - on VICODIN up to 3/ day... Ortho= DrMortenson, Rendall, et al... ~  5/12:  She is c/o incr pain in right side of back/ hip/ leg> refer to Ortho for further eval; Rx Vicodin prn...  FIBROMYALGIA (ICD-729.1) - she has chr fatigue symptoms and diffuse aches & pains... she has been eval by Rheum DrDeveshwar w/ recent dx of Sjogren's Syndrome & DDD- on CYCLOBENZAPRINE 10mg - 1/2-1 tab Prn...  MEMORY LOSS (ICD-780.93) - eval by DrHickling for Neurology w/ MRI done- pt states "it's not dementia, just hardening of the arteries"... his note from 4/08 indicates- OBS, gait abn & neck/ Back pain diagnoses... ~  5/13:  Daughter notes more difficulty & ready to start medication ==> Aricept 5mg /d was recommended but never started by the pt... ~  6/13:  The hospitalists started [facility] 5mg Bid in lieu of the Aricept ==> memory is no better per daughter ~  7/13:  c/o headaches and taking Vicodin as needed...  NEUROPATHY (ICD-355.9) - on GABAPENTIN 100mg Tid w/ some relief; initially started on larger dose (300mg Bid) per DrHickling, but she was intol w/ confusion & slurred speech... neuropathy symptoms improved on Rx.  ANXIETY DEPRESSION (ICD-300.4) - she was started on BUSPAR10mg Tid & CYMBALTA 20mg - 2daily during the 6/13 Hosp; prev on Alpraz0.5mg  as needed... prev Psychiatric Rx from DrCottle but she hasn't seen him in >63yr & refuses to return... Family requested  med for depression & we tried Zoloft 25 to 50mg  /d but they stopped it due to affect on her gait & speech... Note> Klonopin in past caused hallucinations, but she tolerates the intermittent Xanax ok... ~  6/13:  She was seen by DrBogard,  Psychiatrist during the 6/13 hosp & will be referred for outpt follow up...  Hx of ADVERSE DRUG REACTION (ICD-995.20) - hx of mult drug allergies and intolerances including:  PCN, Sulfa, Doxy, NSAIDs, Demerol, Codeine, DCN100, Tramadol, Phenergan, Thorazine, Stelazine...  NOTE: she takes Vicodin for pain, and tolerates this satis...  Health Maintenance - GYN= DrNeal on Premarin, Calcium, Vits, & Fish Oil...   Past Surgical History  Procedure Date  . Vesicovaginal fistula closure w/ tah   . Appendectomy   . Cholecystectomy   . Mandible surgery   . Temporomandibular joint surgery   . Cataract extraction, bilateral   . Abdominal hysterectomy   . Dental surgery     multiple tooth extractions    Outpatient Encounter Prescriptions as of 08/31/2011  Medication Sig Dispense Refill  . ALPRAZolam (XANAX) 0.5 MG tablet Take 1 tablet (0.5 mg total) by mouth 2 (two) times daily as needed for anxiety. For anxiety  60 tablet  5  . Alum & Mag Hydroxide-Simeth (MAGIC MOUTHWASH W/LIDOCAINE) SOLN Take 5 mLs by mouth 4 (four) times daily as needed.      Marland Kitchen aspirin 81 MG tablet Take 81 mg by mouth daily.        . busPIRone (BUSPAR) 10 MG tablet Take 1 tablet (10 mg total) by mouth 3 (three) times daily.  15 tablet  0  . Calcium Carbonate-Vitamin D (CALTRATE 600+D) 600-400 MG-UNIT per tablet Take 2 tablets by mouth daily.        . carboxymethylcellulose (REFRESH TEARS) 0.5 % SOLN 1 drop 2 (two) times daily.        . cyclobenzaprine (FLEXERIL) 10 MG tablet Take 1/2 to 1 tablet by mouth 4 times daily as needed for msucle spasm  120 tablet  5  . cycloSPORINE (RESTASIS) 0.05 % ophthalmic emulsion Place 1 drop into both eyes every 12 (twelve) hours.       . digoxin (LANOXIN) 0.125  MG tablet Take 125 mcg by mouth daily.        Marland Kitchen diltiazem (CARDIZEM CD) 180 MG 24 hr capsule Take 1 capsule (180 mg total) by mouth daily.  30 capsule  11  . DULoxetine (CYMBALTA) 20 MG capsule Take 2 capsules (40 mg total) by mouth daily.      Marland Kitchen estrogens, conjugated, (PREMARIN) 0.9 MG tablet Take 0.9 mg by mouth daily. Take daily for 21 days then do not take for 7 days.       . feeding supplement (ENSURE) PUDG Take 1 Container by mouth 3 (three) times daily between meals.      . folic acid (FOLVITE) 1 MG tablet Take 2 mg by mouth daily.      Marland Kitchen gabapentin (NEURONTIN) 100 MG capsule Take 3 capsules (300 mg total) by mouth 3 (three) times daily.  90 capsule  5  . HYDROcodone-acetaminophen (VICODIN) 5-500 MG per tablet Take 1 tablet by mouth three times daily as needed for pain--DO NOT EXCEED 3 PER DAY.  15 tablet  0  . hydrocortisone (ANUSOL-HC) 2.5 % rectal cream Apply to rectal area after each BM  30 g  11  . hydroxychloroquine (PLAQUENIL) 200 MG tablet Take 200 mg by mouth daily. As directed by Dr. Corliss Skains       . hydroxypropyl methylcellulose (ISOPTO TEARS) 2.5 % ophthalmic solution Place 1 drop into both eyes 4 (four) times daily.      . lansoprazole (PREVACID) 30 MG capsule Take 1 capsule (30 mg total) by mouth daily.  30 capsule  11  . loteprednol (LOTEMAX) 0.5 % ophthalmic suspension Place 1 drop into both eyes 2 (two) times daily.      . memantine (NAMENDA) 5 MG tablet Take 1 tablet (5 mg total) by mouth 2 (two) times daily.  60 tablet  0  . metoprolol (LOPRESSOR) 50 MG tablet Take 25 mg by mouth 2 (two) times daily.      . Multiple Vitamin (MULTIVITAMIN) tablet Take 1 tablet by mouth daily.        . Omega-3 Fatty Acids (FISH OIL) 1000 MG CAPS Take 1 capsule by mouth daily.        . pilocarpine (SALAGEN) 5 MG tablet Take 5 mg by mouth 2 (two) times daily. As directed by Dr. Corliss Skains       . polyethylene glycol (MIRALAX / GLYCOLAX) packet Take 17 g by mouth 2 (two) times daily.       .  sennosides-docusate sodium (SENOKOT-S) 8.6-50 MG tablet Take 2 tablets by mouth at bedtime.         Allergies  Allergen Reactions  . Codeine Nausea Only    unless given with Phenergan  . Doxycycline     Unknown  . Klonopin (Clonazepam)     Causes hallucination   . Meperidine Hcl Nausea Only    unless given with Phenergan  . Naproxen   . Norflex (Orphenadrine Citrate) Nausea Only    Unless given with Phenergan  . Oxycodone-Acetaminophen Nausea Only    unless given with phenergan  . Penicillins     Unknown  . Phenothiazines     Unknown  . Propoxyphene Hcl Nausea Only    unless given with phenergan  . Stelazine     Unknown  . Sulfamethoxazole W-Trimethoprim     Unknown  . Tolectin (Tolmetin Sodium)     Unknown  . Tramadol     Unknown  . Zoloft (Sertraline Hcl)     Caused pt to sleep a lot  . Lubiprostone Rash    Current Medications, Allergies, Past Medical History, Past Surgical History, Family History, and Social History were reviewed in [employer] record.    Review of Systems        See HPI - all other systems neg except as noted... The patient complains of decreased hearing, hoarseness, dyspnea on exertion, and abdominal pain.  The patient denies anorexia, fever, weight loss, weight gain, vision loss, chest pain, syncope, peripheral edema, prolonged cough, headaches, hemoptysis, melena, hematochezia, severe indigestion/heartburn, hematuria, incontinence, muscle weakness, suspicious skin lesions, transient blindness, difficulty walking, depression, unusual weight change, abnormal bleeding, enlarged lymph nodes, and angioedema.     Objective:   Physical Exam      WD, WN, 78 y/o WF in NAD... she has dry eyes & prominent dry mouth symptoms... GENERAL:  Alert & oriented; pleasant & cooperative...  HEENT:  [place]/AT, EOM-wnl, PERRLA, EACs-clear, TMs-wnl, NOSE-clear, THROAT-clear & excessively dry. NECK:  Supple w/ fairROM; no JVD; normal carotid  impulses w/o bruits; no thyromegaly or nodules palpated; no lymphadenopathy. CHEST:  Clear to P & A; without wheezes/ rales/ or rhonchi. HEART:  Regular Rhythm; without murmurs/ rubs/ or gallops heard... ABDOMEN:  Soft & nontender; normal bowel sounds; no organomegaly or masses detected. EXT: without deformities, mild arthritic changes; no varicose veins/ venous insuffic/ or edema. NEURO:  CN's intact;  no focal neuro deficits... DERM:  No lesions noted; no rash etc...  RADIOLOGY DATA:  Reviewed in the EPIC EMR & discussed w/ the patient...  LABORATORY  DATA:  Reviewed in the EPIC EMR & discussed w/ the patient...   Assessment & Plan:    SJOGREN's>  Still w/ very dry Sicca syndrome & medication from DrDeveshwar, Ophthalmology, etc...  HBP>  Controlled on meds, continue same...  MVP>  Followed by DrLittle for Cards, & stable on meds above...  HYPERLIPID>  TG sl elev & rec to get on better low fat diet...  GI>  GERD, HH, Divertics, IBS, +FemHx colon ca>  Stable on Prev30, last colon 2010- OK, Bentyl helps...  UTI / Urinary Retension>  She was hosp 6/13 by [facility]- UTI resolved w/ Ab & Foley managed by Urology, DrOttelin & she has appt to have it removed...  Chronic Pain Syndrome>  She states adeq control w/ Vicodin Rx...  Memory loss/ anxiety/ depression>  Prev eval by Neuro- DrHickling, DrCottle for psyche in past;  During the 6/13 [facility]- she was seen by DrBogard for  Psyche & placed on Buspar & Cymbalta; needs outpt f/u by Psyche...   Patient's Medications  New Prescriptions   No medications on file  Previous Medications   ALPRAZOLAM (XANAX) 0.5 MG TABLET    Take 1 tablet (0.5 mg total) by mouth 2 (two) times daily as needed for anxiety. For anxiety   ALUM & MAG HYDROXIDE-SIMETH (MAGIC MOUTHWASH W/LIDOCAINE) SOLN    Take 5 mLs by mouth 4 (four) times daily as needed.   ASPIRIN 81 MG TABLET    Take 81 mg by mouth daily.     BUSPIRONE (BUSPAR) 10 MG TABLET    Take 1 tablet (10 mg  total) by mouth 3 (three) times daily.   CALCIUM CARBONATE-VITAMIN D (CALTRATE 600+D) 600-400 MG-UNIT PER TABLET    Take 2 tablets by mouth daily.     CARBOXYMETHYLCELLULOSE (REFRESH TEARS) 0.5 % SOLN    1 drop 2 (two) times daily.     CYCLOBENZAPRINE (FLEXERIL) 10 MG TABLET    Take 1/2 to 1 tablet by mouth 4 times daily as needed for msucle spasm   CYCLOSPORINE (RESTASIS) 0.05 % OPHTHALMIC EMULSION    Place 1 drop into both eyes every 12 (twelve) hours.    DIGOXIN (LANOXIN) 0.125 MG TABLET    Take 125 mcg by mouth daily.     DILTIAZEM (CARDIZEM CD) 180 MG 24 HR CAPSULE    Take 1 capsule (180 mg total) by mouth daily.   DULOXETINE (CYMBALTA) 20 MG CAPSULE    Take 2 capsules (40 mg total) by mouth daily.   ESTROGENS, CONJUGATED, (PREMARIN) 0.9 MG TABLET    Take 0.9 mg by mouth daily. Take daily for 21 days then do not take for 7 days.    FEEDING SUPPLEMENT (ENSURE) PUDG    Take 1 Container by mouth 3 (three) times daily between meals.   FOLIC ACID (FOLVITE) 1 MG TABLET    Take 2 mg by mouth daily.   GABAPENTIN (NEURONTIN) 100 MG CAPSULE    Take 3 capsules (300 mg total) by mouth 3 (three) times daily.   HYDROCODONE-ACETAMINOPHEN (VICODIN) 5-500 MG PER TABLET    Take 1 tablet by mouth three times daily as needed for pain--DO NOT EXCEED 3 PER DAY.   HYDROCORTISONE (ANUSOL-HC) 2.5 % RECTAL CREAM    Apply to rectal area after each BM   HYDROXYCHLOROQUINE (PLAQUENIL) 200 MG TABLET    Take 200 mg by mouth daily. As directed by Dr. Corliss Skains    HYDROXYPROPYL METHYLCELLULOSE (ISOPTO TEARS) 2.5 % OPHTHALMIC SOLUTION    Place 1 drop  into both eyes 4 (four) times daily.   LANSOPRAZOLE (PREVACID) 30 MG CAPSULE    Take 1 capsule (30 mg total) by mouth daily.   LOTEPREDNOL (LOTEMAX) 0.5 % OPHTHALMIC SUSPENSION    Place 1 drop into both eyes 2 (two) times daily.   MEMANTINE (NAMENDA) 5 MG TABLET    Take 1 tablet (5 mg total) by mouth 2 (two) times daily.   METOPROLOL (LOPRESSOR) 50 MG TABLET    Take 25 mg by  mouth 2 (two) times daily.   MULTIPLE VITAMIN (MULTIVITAMIN) TABLET    Take 1 tablet by mouth daily.     OMEGA-3 FATTY ACIDS (FISH OIL) 1000 MG CAPS    Take 1 capsule by mouth daily.     PILOCARPINE (SALAGEN) 5 MG TABLET    Take 5 mg by mouth 2 (two) times daily. As directed by Dr. Corliss Skains    POLYETHYLENE GLYCOL [facility] Ethelene Hal) PACKET    Take 17 g by mouth 2 (two) times daily.    SENNOSIDES-DOCUSATE SODIUM (SENOKOT-S) 8.6-50 MG TABLET    Take 2 tablets by mouth at bedtime.   Modified Medications   No medications on file  Discontinued Medications   No medications on file

## 2011-08-31 NOTE — Patient Instructions (Addendum)
Today we updated your med list in our EPIC system...    Continue your current medications the same...  As she transitions back home- please let us know if we can be of assistance in any way  Let's plan a follow up visit in 2 months.Marland KitchenMarland Kitchen

## 2011-09-09 ENCOUNTER — Encounter: Payer: Self-pay | Admitting: Pulmonary Disease

## 2011-10-02 ENCOUNTER — Telehealth: Payer: Self-pay | Admitting: Pulmonary Disease

## 2011-10-02 NOTE — Telephone Encounter (Signed)
Looks like Dr. Jerral Ralph filled these meds last.  Pt had last appt with SN on 7-18 and has a follow up appt with SN on 9-20 and daughter is requesting that SN refill these meds until her next ov.  SN please advise if you are ok to fill these meds.  Thanks  Allergies  Allergen Reactions  . Codeine Nausea Only    unless given with Phenergan  . Doxycycline     Unknown  . Klonopin (Clonazepam)     Causes hallucination   . Meperidine Hcl Nausea Only    unless given with Phenergan  . Naproxen   . Norflex (Orphenadrine Citrate) Nausea Only    Unless given with Phenergan  . Oxycodone-Acetaminophen Nausea Only    unless given with phenergan  . Penicillins     Unknown  . Phenothiazines     Unknown  . Propoxyphene Hcl Nausea Only    unless given with phenergan  . Stelazine     Unknown  . Sulfamethoxazole W-Trimethoprim     Unknown  . Tolectin (Tolmetin Sodium)     Unknown  . Tramadol     Unknown  . Zoloft (Sertraline Hcl)     Caused pt to sleep a lot  . Lubiprostone Rash

## 2011-10-02 NOTE — Telephone Encounter (Signed)
Last refill is for hydrocodone 5-500 1 tab 3 times per day.  Pt's daughter stated that some of pt's medications will be followed by a psychiatrist-unable to get an appt until 11/13/11.  Would like to get a refill until at lease her next appt w/ SN 11/03/11.  Stated Dr. Mindi Junker. Clarene Duke is retiring on Friday, October 06, 2011.  Would like to know if SN will cover these refills until able to get in with all of pt's other docs.  Unsure if medication was prescribed in hospitals or rehabs.  Antionette Fairy

## 2011-10-03 MED ORDER — DULOXETINE HCL 20 MG PO CPEP: 40.0000 mg | ORAL_CAPSULE | Freq: Every day | ORAL | Status: DC

## 2011-10-03 MED ORDER — BUSPIRONE HCL 10 MG PO TABS: 10.0000 mg | ORAL_TABLET | Freq: Three times a day (TID) | ORAL | Status: DC

## 2011-10-03 MED ORDER — HYDROCODONE-ACETAMINOPHEN 5-500 MG PO TABS: ORAL_TABLET | ORAL | Status: DC

## 2011-10-03 MED ORDER — FOLIC ACID 1 MG PO TABS: 2.0000 mg | ORAL_TABLET | Freq: Every day | ORAL | Status: DC

## 2011-10-03 MED ORDER — MEMANTINE HCL 5 MG PO TABS: 5.0000 mg | ORAL_TABLET | Freq: Two times a day (BID) | ORAL | Status: DC

## 2011-10-03 NOTE — Telephone Encounter (Signed)
Rxs given to Leigh to have SN sign so that we can fax

## 2011-10-03 NOTE — Telephone Encounter (Signed)
rx have been printed out and signed by SN.    All rx have been faxed back to [facility] and pts daughter is aware.   Nothing further is needed.

## 2011-10-03 NOTE — Telephone Encounter (Signed)
Per SN----ok to refill these meds for the pt until her ov with SN.  thanks

## 2011-10-28 ENCOUNTER — Other Ambulatory Visit: Payer: Self-pay | Admitting: Pulmonary Disease

## 2011-11-03 ENCOUNTER — Ambulatory Visit (INDEPENDENT_AMBULATORY_CARE_PROVIDER_SITE_OTHER): Payer: Medicare Other | Admitting: Pulmonary Disease

## 2011-11-03 ENCOUNTER — Encounter: Payer: Self-pay | Admitting: Pulmonary Disease

## 2011-11-03 VITALS — BP 142/70 | HR 81 | Temp 97.6°F | Ht 63.5 in | Wt 119.2 lb

## 2011-11-03 DIAGNOSIS — F0390 Unspecified dementia without behavioral disturbance: Secondary | ICD-10-CM

## 2011-11-03 DIAGNOSIS — I059 Rheumatic mitral valve disease, unspecified: Secondary | ICD-10-CM

## 2011-11-03 DIAGNOSIS — F341 Dysthymic disorder: Secondary | ICD-10-CM

## 2011-11-03 DIAGNOSIS — K589 Irritable bowel syndrome without diarrhea: Secondary | ICD-10-CM

## 2011-11-03 DIAGNOSIS — M545 Low back pain, unspecified: Secondary | ICD-10-CM

## 2011-11-03 DIAGNOSIS — IMO0001 Reserved for inherently not codable concepts without codable children: Secondary | ICD-10-CM

## 2011-11-03 DIAGNOSIS — G589 Mononeuropathy, unspecified: Secondary | ICD-10-CM

## 2011-11-03 DIAGNOSIS — H04129 Dry eye syndrome of unspecified lacrimal gland: Secondary | ICD-10-CM

## 2011-11-03 DIAGNOSIS — E7800 Pure hypercholesterolemia, unspecified: Secondary | ICD-10-CM

## 2011-11-03 DIAGNOSIS — I10 Essential (primary) hypertension: Secondary | ICD-10-CM

## 2011-11-03 DIAGNOSIS — K219 Gastro-esophageal reflux disease without esophagitis: Secondary | ICD-10-CM

## 2011-11-03 DIAGNOSIS — M3500 Sicca syndrome, unspecified: Secondary | ICD-10-CM

## 2011-11-03 DIAGNOSIS — K5730 Diverticulosis of large intestine without perforation or abscess without bleeding: Secondary | ICD-10-CM

## 2011-11-03 MED ORDER — GABAPENTIN 300 MG PO CAPS: 300.0000 mg | ORAL_CAPSULE | Freq: Three times a day (TID) | ORAL | Status: DC

## 2011-11-03 NOTE — Patient Instructions (Addendum)
Today we updated your med list in our EPIC system...    Continue your current medications the same...    We refilled the meds you requested...  We decided to add LASIX 20mg  - one tab every other day...  Call for any questions...  Let's plan a follow up visit in 2-3 months.Ariana KitchenMarland [name]

## 2011-11-04 ENCOUNTER — Encounter: Payer: Self-pay | Admitting: Pulmonary Disease

## 2011-11-04 NOTE — Progress Notes (Addendum)
Subjective:    Patient ID: Ariana [name], female    DOB: [birth year], 77 y.o.   MRN: [id number]  HPI 78 y/o WF here for a follow up visit... he has multiple medical problems as noted below...   ~  May11:  she recently saw DrDeveshwar for Rheum w/ extensive eval & labs done> Dx w/ Sjogren's syndrome/ autoimmune disease... her note is pending but pt indicates new Rx w/ Pilocarpine 5mg Bid, and considering Plaquenil rx...  she saw DrHecker 2/11 & DrLittle 1/11 - see below... she is c/o incr neuropathy symptoms in her legs and wonders what can be done for this> she is on Neurontin from DrHickling at 100mg Tid & we discussed incr to 300mg Bid... ~  LOV56:  she states she was dx w/ "autoimmune disease" by Rheum DrDeveshwar & started on Plaquenil 200mg /d now- notes from 7/11 & 10/11 reviewed... c/o abd bloating/ gas treated by GI DrGessner w/ Prevacid, Miralax, Senakot-S, OTC Simethacone (Mylicon/ Phazyme)- we discussed trial Bentyl for cramping... BP remains controlled;  denies CP, palpit, ch in SOB, etc;  notes persistant back discomfort. aches/ pains & wants Vicodin refilled;  she remains on Neurontin for her neuropathy, and Alpraz for nerves...  ~  Jul 08, 2010:  47mo ROV & she is here w/ her daughter> "it's been rough" c/o incr pain in right side of low back, right hip, right leg- prev eval DrMortenson & we discussed need for Ortho re-evaluation; she is seen regularly (every 6+mo) by Rober Minion for Rheum- Sjogren's syn, ?FM, LBP, Neuropathy> on Plaquenil, Vicodin, Flexeril, Neurontin, etc...  From the medical standpoint> BP controlled; GI stable on meds; under a lot of stress & memory loss symptoms about the same; hx mult drug side effects... Labs today look OK x elev TG=202 & advised better low fat diet...  ~  January 13, 2011:  47mo ROV & she is here w/ relative again and there is come conflict regarding her care needs etc; they mentioned DrRendall eval & attempts to get PT eval, had home care assessment  but I really didn't understand where the problem lied- ?she wanted maid service, of course medicare doesn't provide this, etc;  After long conversation we left it at calling [facility] for home PT/ OT if indeed she is eligible; they make note of the fact that DrDeveshwar is for the autoimmune dis & DrRendall is for the DDD, leg pain & he prev ordered the "therapy"; recall that she prev saw DrHickling for the Neuropathy & he started the Gabapentin...      From the medical perspective> BP well regulated on ToprolXL & Diltiazem; she denies CP, palpit, SOB or edema- ?when she saw DrLittle last & still on ASA/ Lanoxin under his direction & she denies cerebral ischemic symptoms; still trying to control lipids w/ diet alone (see below); GI stable on meds listed;  Her CC is her Sjogren's, and Neuropathy which she says is coming from her back per DrRendall- rec to incr the Neurontin back to 300mg  Bid;  Family is concerned about her depression & she no longer sees DrCottle (angry talking about their last meeting)> offered ZOLOFT 25-50mg  daily as a trial & they agree...  ~  Jul 14, 2011:  47mo ROV & Ariana [name] is here w/ her daughter who is her caregiver; there is much conflict w/ pt being argumentative & set in her ways, resisting help from daugh, etc; especially regarding her meds> daughter notes several meds caused reactions- staggering, hallucinations, confusion (eg- Zoloft, Klonopin); pt takes Xanax  as needed (admin by family) & she is really angry about not getting more pain medicine when she wants it; we discussed these issues but she has early dementia & doesn't "get-it", daugh requested Alz med & we will start DONEPIZIL 5mg /d for 35mo the 10mg /d thereafter...  Pt spent an inordinate amt of time ranting about her teeth, dentures, implants, TMJ, her severe dry mouth, etc...    We reviewed prob list, meds, xrays and labs> see below>> LABS 5/13:  Chems- ok w/ BS=105 Na=132;  CBC- wnl;  Fe=123 (30%sat);  TSH=1.56; Urine- +UTI ==>  we will Rx w/ Cipro250Bid...  ~  August 02, 2011:  3wk [place] & post hospital visit>  She was Adm 6/4 - 07/25/11 by [facility] w/ UTI & urinary retension, altered mental status, & her mult other medical problems;  Her urine was abn but no growth on cult & she improved w/ Ceftin;  She had a foley placed but this was not discontinued at disch to the rehab facility- she was sent w/ it & has had f/u w/ Urology for removal;  She had just been started on Aricept (but hadn't yet taken her 1st pill) & was switched to Namenda 5mg Bid;  She was seen by Psychiatry DrBogard and started on Buspar & Cymbalta- but they do not see outpts so we will have to refer to an office based Psychiatrist for on-going help;  She was also disch on SSI but her sugars were only minimally elev & A1c=5.7, and we will stop the insulin & monitor sugars on diet alone...      We reviewed prob list, meds, xrays and labs> see below>> CXR 6/13 showed atx & scarring LLL, NAD... CT Brain 6/13 showed cortical atrophy, NAD... CT Abd 6/13 showed distented bladder, constip, s/p hyst, left renal cyst, diverticulosis & some wall thickening in transverse colon... EKG 6/13 showed NSR, rate74, LAD, minor NSSTTWA... LABS 6/13:  CBC- Hg=11-12, WBC=6-7K;  Sed=16;  Chems- ok x elev BS (90-160), A1c=5.7;  FLP- at goals;  TFTs= wnl...  ~  August 31, 2011:  35mo ROV & Ariana [name] feels like she's a new person- improving, getting therapy, not talking to herself anymore; unfortunately she has been unable to see an outpt psychiatrist & asking social services at [facility] for help... Daughter has been helpful trying to get her the psychiatric care she needs (recall that she saw Neuro- DrReynolds (Dementia) & Psyche- DrBogard (started on Buspar & Cymbalta) during her recent hosp)...    We reviewed prob list, meds, xrays and labs> see below see below >>  ~  November 03, 2011:  48mo ROV & daugh is frustrated w/ pt (dementia, behavioral issues) & difficulty arranging for outpt  psychiatric care- she had appt w/ DrPlovsky but it's been moved out twice... Pt is now at home but actually liked being at [facility] & their rehab program "I had to leave"; she & daugh seem to be at odds esp re pt's dementia; since ret home she has fallen w/ pain in her tailbone & we discussed mentoning this to DrDeveshwar for poss XRays & to see what could possibly be done for that; they request refill of her Gabapentin 300mg Tid...  She notes several ecchymoses "THEY ARE NOT BRUISES" she emphasizes;  BP has been intermittently elev at home since disch from the NH & measures 142/70; furthermore they note intermittent swelling in ankles 7 we discussed compromise by adding LASIX 20mg  Qod...    She has a f/u appt coming  up w/ DrHilty at [facility] (DrLittle has retired)...    She saw Urology 6/13> bladder distention w/ poor emptying; she was disch w/ foley, & had that eventually removed w/ improved voiding & no signif PVR...     We reviewed prob list, meds, xrays and labs> see below for updates >> ADDENDUM:  ThyroidSonar 11/13> dominant left thyroid nodule biopsied ==> c/w hyperplastic nodule, no malig cells ident...           Problem List:  SJOGREN'S SYNDROME (ICD-710.2), & DRY EYE SYNDROME (ICD-375.15) - eval by DrHecker & DrDeveshwar 5/11 w/ extensive lab work done & Dx of Sjogren's syndrome & started on Pilocarpine 5mg Bid... DrHecker has her on a number of eye drops, & prev thought to be dry eyes w/ ocular rosacea & tubes placed in tear ducts... also seen by Mikey Kirschner...  ~  10/11:  DrDeveshwar started PLAQUENIL 200mg /d... Seen every 83mo by Rheum. ~  Most recent note from [facility] 4/13> Sjogren's, Raynaud's, LBP, musc spasms, on Plaquenil Rx & no changes made.  CHRONIC ORAL DISCOMFORT - this has been looked into by mult physicians and no other etiology discovered (other than her sicca syndrome)... she uses Magic Mouthwash Prn + sialogogues...  HYPERTENSION, BENIGN (ICD-401.1) - controlled on  TOPROL XL 50mg - 1/2 tabBid, DILTIAZEM CD 180mg /d, & LASIX 20mg  Qod...   ~  5/12: BP= 134/80 today, takes meds regularly & tol well... denies CP, worsening palipit, syncope, dyspnea, edema, etc... ~  11/12: BP= 122/60 & she remains asymptomatic... ~  5/13:  BP= 122/70 & she denies CP, palpit, change in SOB, edema, etc... ~  CXR 6/13 showed stable bronchitic changes w/ thickening of airways, scarring at lung bases, no acute changes.  MITRAL VALVE PROLAPSE (ICD-424.0) - on ASA 81mg /d, & LANOXIN 0.125mg /d and followed by DrLittle for Cardiology... his notes are reviewed> MVP, HBP, hx palpit, neg cath... ~  cath 1/05 by DrLittle showed clean coronaries & norm LVF w/ +MVP... ~  1/11:  Seen by DrLittle for f/u MVP & palpit; BP was sl elev & he rec incr Metoprolol to 25mg  Bid.  HYPERLIPIDEMIA>  On low chol, low fat diet alone... She takes Fish Oil, flax seed oil, etc. ~  2008:  FLP showed TChol 170, TG 176, HDL 50,  ~  5/12:  FLP showed TChol 142, TG 202, HDL 62, LDL 166 ~  FLP 6/13 showed TChol 128, TG 56, HDL 61, LDL 56  Elev Blood Sugar >> occurred during the 6/13 hosp & covered by SSI during the hosp & sent to rehab center w/ this==> discontinued on office follow up...  MULTINODULAR THYROID >> Asymptomatic thyroid nodule ident on MRI scan of neck... ~  10/13:  Thyroid Ultrasound showed multinodular gland w/ dominant nodule measuring 4cm on left lobe... ~  11/13:  Thyroid Biopsy by IR> neg- c/w hyperplastic nodule, no malig cells seen...  GERD (ICD-530.81) - on PREVACID 30mg /d... denies reflux symptoms, swallowing OK x for dryness, etc... ~  last EGD 9/02 showed 4cmHH, GERD...  DIVERTICULOSIS OF COLON (ICD-562.10),  IRRITABLE BOWEL SYNDROME (ICD-564.1), & Family Hx of COLON CANCER (ICD-153.9) - followed for GI by DrSamLeBauer- now DrGessner- colonoscopy 2/06 showed a tortuous and spastic colon, divertics, otherw neg... her sister died in her 36's w/ colon cancer... f/u colonoscopy 4/10 showed mod  divertics, hems, melanosis> Rx'd w/ Miralax/ Senakot-S. ~  11/11:  she notes incr IBS symptoms- rec Rx w/ BENTYL 20mg Qid Prn, Mylicon, Phazyme ==> improved.  URINARY INCONTINENCE (ICD-788.30) - eval  by GYN= DrRNeal (on Premarin 0.9mg /d)... ~  7/13:  Foley discontinued, voiding better w/ freq trips to the commode to prevent leaking...  LOW BACK PAIN SYNDROME (ICD-724.2) - on VICODIN up to 3/ day... Ortho= DrMortenson, Rendall, et al... ~  5/12:  She is c/o incr pain in right side of back/ hip/ leg> refer to Ortho for further eval; Rx Vicodin prn...  FIBROMYALGIA (ICD-729.1) - she has chr fatigue symptoms and diffuse aches & pains... she has been eval by Rheum DrDeveshwar w/ recent dx of Sjogren's Syndrome & DDD- on CYCLOBENZAPRINE 10mg - 1/2-1 tab Prn...  MEMORY LOSS (ICD-780.93) - eval by DrHickling for Neurology w/ MRI done- pt states "it's not dementia, just hardening of the arteries"... his note from 4/08 indicates- OBS, gait abn & neck/ Back pain diagnoses... ~  5/13:  Daughter notes more difficulty & ready to start medication ==> Aricept 5mg /d was recommended but never started by the pt... ~  6/13:  The hospitalists started [facility] 5mg Bid in lieu of the Aricept ==> memory is no better per daughter ~  7/13:  c/o headaches and taking Vicodin as needed...  NEUROPATHY (ICD-355.9) - on GABAPENTIN 100mg Tid w/ some relief; initially started on larger dose (300mg Bid) per DrHickling, but she was intol w/ confusion & slurred speech... neuropathy symptoms improved on Rx. ~  6/13:  She was disch from hosp on larger dose of Neurontin 100mg - 3Tid (subseq changed to 300mg tabTid)  ANXIETY DEPRESSION (ICD-300.4) - she was started on BUSPAR10mg Tid & CYMBALTA 20mg - 2daily during the 6/13 Hosp; prev on Alpraz0.5mg  as needed... prev Psychiatric Rx from DrCottle but she hasn't seen him in >55yr & refuses to return... Family requested med for depression & we tried Zoloft 25 to 50mg  /d but they stopped it due to  affect on her gait & speech... Note> Klonopin in past caused hallucinations, but she tolerates the intermittent Xanax ok... ~  6/13:  She was seen by DrBogard, Psychiatrist during the 6/13 hosp & will be referred for outpt follow up... ~  Daugh has arranged for f/u appt w/ drPlovsky but hasn't yet seen him due to delays in the appt...  Hx of ADVERSE DRUG REACTION (ICD-995.20) - hx of mult drug allergies and intolerances including:  PCN, Sulfa, Doxy, NSAIDs, Demerol, Codeine, DCN100, Tramadol, Phenergan, Thorazine, Stelazine...  NOTE: she takes Vicodin for pain, and tolerates this satis...  Health Maintenance - GYN= DrNeal on Premarin, Calcium, Vits, & Fish Oil...   Past Surgical History  Procedure Date  . Vesicovaginal fistula closure w/ tah   . Appendectomy   . Cholecystectomy   . Mandible surgery   . Temporomandibular joint surgery   . Cataract extraction, bilateral   . Abdominal hysterectomy   . Dental surgery     multiple tooth extractions    Outpatient Encounter Prescriptions as of 11/03/2011  Medication Sig Dispense Refill  . ALPRAZolam (XANAX) 0.5 MG tablet Take 1 tablet (0.5 mg total) by mouth 2 (two) times daily as needed for anxiety. For anxiety  60 tablet  5  . Alum & Mag Hydroxide-Simeth (MAGIC MOUTHWASH W/LIDOCAINE) SOLN Take 5 mLs by mouth 4 (four) times daily as needed.      Marland Kitchen aspirin 81 MG tablet Take 81 mg by mouth daily.        . busPIRone (BUSPAR) 10 MG tablet Take 1 tablet (10 mg total) by mouth 3 (three) times daily.  90 tablet  1  . Calcium Carbonate-Vitamin D (CALTRATE 600+D) 600-400 MG-UNIT per tablet Take 2 tablets  by mouth daily.        . carboxymethylcellulose (REFRESH TEARS) 0.5 % SOLN 1 drop 2 (two) times daily.        . cyclobenzaprine (FLEXERIL) 10 MG tablet Take 1/2 to 1 tablet by mouth 4 times daily as needed for msucle spasm  120 tablet  5  . cycloSPORINE (RESTASIS) 0.05 % ophthalmic emulsion Place 1 drop into both eyes every 12 (twelve) hours.       .  digoxin (LANOXIN) 0.125 MG tablet Take 125 mcg by mouth daily.        Marland Kitchen diltiazem (CARDIZEM CD) 180 MG 24 hr capsule Take 1 capsule (180 mg total) by mouth daily.  30 capsule  11  . DULoxetine (CYMBALTA) 20 MG capsule Take 2 capsules (40 mg total) by mouth daily.  60 capsule  1  . estrogens, conjugated, (PREMARIN) 0.9 MG tablet Take 0.9 mg by mouth daily. Take daily for 21 days then do not take for 7 days.       . feeding supplement (ENSURE) PUDG Take 1 Container by mouth 3 (three) times daily between meals.      . folic acid (FOLVITE) 1 MG tablet Take 2 tablets (2 mg total) by mouth daily.  60 tablet  1  . furosemide (LASIX) 20 MG tablet Take 20 mg by mouth every other day.      Marland Kitchen HYDROcodone-acetaminophen (VICODIN) 5-500 MG per tablet Take 1 tablet by mouth three times daily as needed for pain--DO NOT EXCEED 3 PER DAY.  90 tablet  1  . hydrocortisone (ANUSOL-HC) 2.5 % rectal cream Apply to rectal area after each BM  30 g  11  . hydroxychloroquine (PLAQUENIL) 200 MG tablet Take 200 mg by mouth daily. As directed by Dr. Corliss Skains       . hydroxypropyl methylcellulose (ISOPTO TEARS) 2.5 % ophthalmic solution Place 1 drop into both eyes 4 (four) times daily.      . lansoprazole (PREVACID) 30 MG capsule Take 1 capsule (30 mg total) by mouth daily.  30 capsule  11  . loteprednol (LOTEMAX) 0.5 % ophthalmic suspension Place 1 drop into both eyes 2 (two) times daily.      . memantine (NAMENDA) 5 MG tablet Take 1 tablet (5 mg total) by mouth 2 (two) times daily.  60 tablet  1  . metoprolol (LOPRESSOR) 50 MG tablet Take 25 mg by mouth 2 (two) times daily.      . Multiple Vitamin (MULTIVITAMIN) tablet Take 1 tablet by mouth daily.        Marland Kitchen nystatin (MYCOSTATIN) 100000 UNIT/ML suspension USE 1 TEASPOONFUL TO GARGLE AND SWALLOW FOUR TIMES A DAY AS NEEDED  120 mL  5  . Omega-3 Fatty Acids (FISH OIL) 1000 MG CAPS Take 1 capsule by mouth daily.        . pilocarpine (SALAGEN) 5 MG tablet Take 5 mg by mouth 2 (two)  times daily. As directed by Dr. Corliss Skains       . polyethylene glycol (MIRALAX / GLYCOLAX) packet Take 17 g by mouth 2 (two) times daily.       . sennosides-docusate sodium (SENOKOT-S) 8.6-50 MG tablet Take 2 tablets by mouth at bedtime.       Marland Kitchen DISCONTD: gabapentin (NEURONTIN) 100 MG capsule Take 3 capsules (300 mg total) by mouth 3 (three) times daily.  90 capsule  5  . gabapentin (NEURONTIN) 300 MG capsule Take 1 capsule (300 mg total) by mouth 3 (three) times daily.  90 capsule  6    Allergies  Allergen Reactions  . Codeine Nausea Only    unless given with Phenergan  . Doxycycline     Unknown  . Klonopin (Clonazepam)     Causes hallucination   . Meperidine Hcl Nausea Only    unless given with Phenergan  . Naproxen   . Norflex (Orphenadrine Citrate) Nausea Only    Unless given with Phenergan  . Oxycodone-Acetaminophen Nausea Only    unless given with phenergan  . Penicillins     Unknown  . Phenothiazines     Unknown  . Propoxyphene Hcl Nausea Only    unless given with phenergan  . Stelazine     Unknown  . Sulfamethoxazole W-Trimethoprim     Unknown  . Tolectin (Tolmetin Sodium)     Unknown  . Tramadol     Unknown  . Zoloft (Sertraline Hcl)     Caused pt to sleep a lot  . Lubiprostone Rash    Current Medications, Allergies, Past Medical History, Past Surgical History, Family History, and Social History were reviewed in [employer] record.    Review of Systems        See HPI - all other systems neg except as noted... The patient complains of decreased hearing, hoarseness, dyspnea on exertion, and abdominal pain.  The patient denies anorexia, fever, weight loss, weight gain, vision loss, chest pain, syncope, peripheral edema, prolonged cough, headaches, hemoptysis, melena, hematochezia, severe indigestion/heartburn, hematuria, incontinence, muscle weakness, suspicious skin lesions, transient blindness, difficulty walking, depression, unusual  weight change, abnormal bleeding, enlarged lymph nodes, and angioedema.     Objective:   Physical Exam      WD, WN, 78 y/o WF in NAD... she has dry eyes & prominent dry mouth symptoms... GENERAL:  Alert & oriented; pleasant & cooperative...  HEENT:  [place]/AT, EOM-wnl, PERRLA, EACs-clear, TMs-wnl, NOSE-clear, THROAT-clear & excessively dry. NECK:  Supple w/ fairROM; no JVD; normal carotid impulses w/o bruits; no thyromegaly or nodules palpated; no lymphadenopathy. CHEST:  Clear to P & A; without wheezes/ rales/ or rhonchi. HEART:  Regular Rhythm; without murmurs/ rubs/ or gallops heard... ABDOMEN:  Soft & nontender; normal bowel sounds; no organomegaly or masses detected. EXT: without deformities, mild arthritic changes; no varicose veins/ venous insuffic/ or edema. NEURO:  CN's intact;  no focal neuro deficits... DERM:  No lesions noted; no rash etc...  RADIOLOGY DATA:  Reviewed in the EPIC EMR & discussed w/ the patient...  LABORATORY DATA:  Reviewed in the EPIC EMR & discussed w/ the patient...   Assessment & Plan:    SJOGREN's>  Still w/ very dry Sicca syndrome & medication from DrDeveshwar, Ophthalmology, etc...  HBP>  Controlled on meds, continue same but add regular dosing of the Lasix 20mg  Qod...  MVP>  Followed by DrLittle for Cards, & stable on meds above...  HYPERLIPID>  TG sl elev & rec to get on better low fat diet...  GI>  GERD, HH, Divertics, IBS, +FemHx colon ca>  Stable on Prev30, last colon 2010- OK, Bentyl helps...  UTI / Urinary Retension>  She was hosp 6/13 by [facility]- UTI resolved w/ Ab & Foley managed by Urology, DrOttelin & she has appt to have it removed...  Chronic Pain Syndrome>  She states adeq control w/ Vicodin Rx...  Memory loss/ anxiety/ depression>  Prev eval by Neuro- DrHickling, DrCottle for psyche in past;  During the 6/13 [facility]- she was seen by DrBogard for  Psyche & placed  on Buspar & Cymbalta; needs outpt f/u by Psyche...   Patient's  Medications  New Prescriptions   GABAPENTIN (NEURONTIN) 300 MG CAPSULE    Take 1 capsule (300 mg total) by mouth 3 (three) times daily.  Previous Medications   ALPRAZOLAM (XANAX) 0.5 MG TABLET    Take 1 tablet (0.5 mg total) by mouth 2 (two) times daily as needed for anxiety. For anxiety   ALUM & MAG HYDROXIDE-SIMETH (MAGIC MOUTHWASH W/LIDOCAINE) SOLN    Take 5 mLs by mouth 4 (four) times daily as needed.   ASPIRIN 81 MG TABLET    Take 81 mg by mouth daily.     BUSPIRONE (BUSPAR) 10 MG TABLET    Take 1 tablet (10 mg total) by mouth 3 (three) times daily.   CALCIUM CARBONATE-VITAMIN D (CALTRATE 600+D) 600-400 MG-UNIT PER TABLET    Take 2 tablets by mouth daily.     CARBOXYMETHYLCELLULOSE (REFRESH TEARS) 0.5 % SOLN    1 drop 2 (two) times daily.     CYCLOBENZAPRINE (FLEXERIL) 10 MG TABLET    Take 1/2 to 1 tablet by mouth 4 times daily as needed for msucle spasm   CYCLOSPORINE (RESTASIS) 0.05 % OPHTHALMIC EMULSION    Place 1 drop into both eyes every 12 (twelve) hours.    DIGOXIN (LANOXIN) 0.125 MG TABLET    Take 125 mcg by mouth daily.     DILTIAZEM (CARDIZEM CD) 180 MG 24 HR CAPSULE    Take 1 capsule (180 mg total) by mouth daily.   DULOXETINE (CYMBALTA) 20 MG CAPSULE    Take 2 capsules (40 mg total) by mouth daily.   ESTROGENS, CONJUGATED, (PREMARIN) 0.9 MG TABLET    Take 0.9 mg by mouth daily. Take daily for 21 days then do not take for 7 days.    FEEDING SUPPLEMENT (ENSURE) PUDG    Take 1 Container by mouth 3 (three) times daily between meals.   FOLIC ACID (FOLVITE) 1 MG TABLET    Take 2 tablets (2 mg total) by mouth daily.   FUROSEMIDE (LASIX) 20 MG TABLET    Take 20 mg by mouth every other day.   HYDROCODONE-ACETAMINOPHEN (VICODIN) 5-500 MG PER TABLET    Take 1 tablet by mouth three times daily as needed for pain--DO NOT EXCEED 3 PER DAY.   HYDROCORTISONE (ANUSOL-HC) 2.5 % RECTAL CREAM    Apply to rectal area after each BM   HYDROXYCHLOROQUINE (PLAQUENIL) 200 MG TABLET    Take 200 mg by  mouth daily. As directed by Dr. Corliss Skains    HYDROXYPROPYL METHYLCELLULOSE (ISOPTO TEARS) 2.5 % OPHTHALMIC SOLUTION    Place 1 drop into both eyes 4 (four) times daily.   LANSOPRAZOLE (PREVACID) 30 MG CAPSULE    Take 1 capsule (30 mg total) by mouth daily.   LOTEPREDNOL (LOTEMAX) 0.5 % OPHTHALMIC SUSPENSION    Place 1 drop into both eyes 2 (two) times daily.   MEMANTINE (NAMENDA) 5 MG TABLET    Take 1 tablet (5 mg total) by mouth 2 (two) times daily.   METOPROLOL (LOPRESSOR) 50 MG TABLET    Take 25 mg by mouth 2 (two) times daily.   MULTIPLE VITAMIN (MULTIVITAMIN) TABLET    Take 1 tablet by mouth daily.     NYSTATIN (MYCOSTATIN) 100000 UNIT/ML SUSPENSION    USE 1 TEASPOONFUL TO GARGLE AND SWALLOW FOUR TIMES A DAY AS NEEDED   OMEGA-3 FATTY ACIDS (FISH OIL) 1000 MG CAPS    Take 1 capsule by mouth daily.  PILOCARPINE (SALAGEN) 5 MG TABLET    Take 5 mg by mouth 2 (two) times daily. As directed by Dr. Corliss Skains    POLYETHYLENE GLYCOL [facility] Ethelene Hal) PACKET    Take 17 g by mouth 2 (two) times daily.    SENNOSIDES-DOCUSATE SODIUM (SENOKOT-S) 8.6-50 MG TABLET    Take 2 tablets by mouth at bedtime.   Modified Medications   No medications on file  Discontinued Medications   GABAPENTIN (NEURONTIN) 100 MG CAPSULE    Take 3 capsules (300 mg total) by mouth 3 (three) times daily.

## 2011-11-10 ENCOUNTER — Other Ambulatory Visit: Payer: Self-pay | Admitting: Pulmonary Disease

## 2011-11-21 ENCOUNTER — Encounter (HOSPITAL_COMMUNITY): Payer: Self-pay

## 2011-11-21 ENCOUNTER — Emergency Department (HOSPITAL_COMMUNITY): Payer: Medicare Other

## 2011-11-21 ENCOUNTER — Emergency Department (HOSPITAL_COMMUNITY)
Admission: EM | Admit: 2011-11-21 | Discharge: 2011-11-21 | Disposition: A | Discharge status: Home / Self Care | Payer: Medicare Other | Attending: Emergency Medicine | Admitting: Emergency Medicine

## 2011-11-21 DIAGNOSIS — W19XXXA Unspecified fall, initial encounter: Secondary | ICD-10-CM

## 2011-11-21 DIAGNOSIS — Z79899 Other long term (current) drug therapy: Secondary | ICD-10-CM | POA: Insufficient documentation

## 2011-11-21 DIAGNOSIS — W06XXXA Fall from bed, initial encounter: Secondary | ICD-10-CM | POA: Insufficient documentation

## 2011-11-21 DIAGNOSIS — F0390 Unspecified dementia without behavioral disturbance: Secondary | ICD-10-CM | POA: Insufficient documentation

## 2011-11-21 DIAGNOSIS — S322XXA Fracture of coccyx, initial encounter for closed fracture: Secondary | ICD-10-CM

## 2011-11-21 DIAGNOSIS — M4802 Spinal stenosis, cervical region: Secondary | ICD-10-CM | POA: Insufficient documentation

## 2011-11-21 DIAGNOSIS — I10 Essential (primary) hypertension: Secondary | ICD-10-CM | POA: Insufficient documentation

## 2011-11-21 DIAGNOSIS — S0990XA Unspecified injury of head, initial encounter: Secondary | ICD-10-CM

## 2011-11-21 DIAGNOSIS — G589 Mononeuropathy, unspecified: Secondary | ICD-10-CM | POA: Insufficient documentation

## 2011-11-21 DIAGNOSIS — S3210XA Unspecified fracture of sacrum, initial encounter for closed fracture: Secondary | ICD-10-CM | POA: Insufficient documentation

## 2011-11-21 DIAGNOSIS — R51 Headache: Secondary | ICD-10-CM | POA: Insufficient documentation

## 2011-11-21 MED ORDER — OXYCODONE-ACETAMINOPHEN 5-325 MG PO TABS: 1.0000 | ORAL_TABLET | ORAL | Status: DC | PRN

## 2011-11-21 NOTE — ED Provider Notes (Signed)
History  Scribed for Ariana Lennert, MD, the patient was seen in room TR10C/TR10C. This chart was scribed by Ariana [name]. The patient's care started at 12:10 PM   CSN: [id number]  Arrival date & time 11/21/11  1052   First MD Initiated Contact with Patient 11/21/11 1208      Chief Complaint  Patient presents with  . Back Pain  . Headache  . Fall    Patient is a 78 y.o. female presenting with back pain and fall. The history is provided by the patient. No language interpreter was used.  Back Pain  This is a new problem. The current episode started more than 2 days ago. The problem occurs constantly. The problem has not changed since onset.The pain is associated with falling. The pain is present in the thoracic spine. Pertinent negatives include no chest pain, no headaches and no abdominal pain. She has tried nothing for the symptoms. The treatment provided no relief.  Fall The accident occurred more than 2 days ago. The fall occurred from a bed. She fell from a height of 3 to 5 ft. She landed on carpet. The point of impact was the head. The pain is present in the head. Pertinent negatives include no abdominal pain, no hematuria and no headaches.   Ariana [name] is a 78 y.o. female who presents to the Emergency Department complaining of back pain and head pain after experiencing a fall three days ago.  Pt reports that she rolled out of bed hitting her head.  She denies syncope.  Nothing seems to make the sx better or worse.    Past Medical History  Diagnosis Date  . Sjogren's syndrome   . Dry eye syndrome   . Hypertension, benign   . Mitral valve prolapse   . GERD (gastroesophageal reflux disease)   . Diverticulosis of colon   . Irritable bowel syndrome   . Urinary incontinence   . Low back pain syndrome   . Fibromyalgia   . Memory loss   . Anxiety and depression   . History of adverse drug reaction   . Peripheral neuropathy     "both feet and legs"  . Shortness of breath  07/18/11    "alot lately"  . Anginal pain   . History of recurrent UTIs   . H/O hiatal hernia   . Anxiety   . Dementia   . Depression     Past Surgical History  Procedure Date  . Vesicovaginal fistula closure w/ tah   . Appendectomy   . Cholecystectomy   . Mandible surgery   . Temporomandibular joint surgery   . Cataract extraction, bilateral   . Abdominal hysterectomy   . Dental surgery     multiple tooth extractions    Family History  Problem Relation Age of Onset  . Colon cancer    . Heart disease Father   . Pneumonia Mother   . Heart attack Mother   . Cancer Sister   . Cancer Brother     History  Substance Use Topics  . Smoking status: Former Games developer  . Smokeless tobacco: Never Used  . Alcohol Use: No    OB History    Grav Para Term Preterm Abortions TAB SAB Ect Mult Living                  Review of Systems  Constitutional: Negative for fatigue.  HENT: Negative for congestion, sinus pressure and ear discharge.  Pain to back of head   Eyes: Negative for discharge.  Respiratory: Negative for cough.   Cardiovascular: Negative for chest pain.  Gastrointestinal: Negative for abdominal pain and diarrhea.  Genitourinary: Negative for frequency and hematuria.  Musculoskeletal: Positive for back pain and arthralgias (tail bone pain).  Skin: Negative for rash.  Neurological: Negative for seizures, syncope and headaches.  Hematological: Negative.   Psychiatric/Behavioral: Negative for hallucinations.    Allergies  Codeine; Doxycycline; Klonopin; Meperidine hcl; Naproxen; Norflex; Oxycodone-acetaminophen; Penicillins; Phenothiazines; Propoxyphene hcl; Stelazine; Sulfamethoxazole w-trimethoprim; Tolectin; Tramadol; Zoloft; and Lubiprostone  Home Medications   Current Outpatient Rx  Name Route Sig Dispense Refill  . ALPRAZOLAM 0.5 MG PO TABS Oral Take 1 tablet (0.5 mg total) by mouth 2 (two) times daily as needed for anxiety. For anxiety 60 tablet 5  .  MAGIC MOUTHWASH W/LIDOCAINE Oral Take 5 mLs by mouth 4 (four) times daily as needed.    . ASPIRIN 81 MG PO TABS Oral Take 81 mg by mouth daily.      . BUSPIRONE HCL 10 MG PO TABS Oral Take 1 tablet (10 mg total) by mouth 3 (three) times daily. 90 tablet 1  . CALCIUM CARBONATE-VITAMIN D 600-400 MG-UNIT PO TABS Oral Take 2 tablets by mouth daily.      Marland Kitchen CARBOXYMETHYLCELLULOSE SODIUM 0.5 % OP SOLN  1 drop 2 (two) times daily.      . CYCLOBENZAPRINE HCL 10 MG PO TABS  Take 1/2 to 1 tablet by mouth 4 times daily as needed for msucle spasm 120 tablet 5  . CYCLOSPORINE 0.05 % OP EMUL Both Eyes Place 1 drop into both eyes every 12 (twelve) hours.     Marland Kitchen DIGOXIN 0.125 MG PO TABS Oral Take 125 mcg by mouth daily.      Marland Kitchen DILTIAZEM HCL ER COATED BEADS 180 MG PO CP24 Oral Take 1 capsule (180 mg total) by mouth daily. 30 capsule 11  . DULOXETINE HCL 20 MG PO CPEP Oral Take 2 capsules (40 mg total) by mouth daily. 60 capsule 1  . ESTROGENS CONJUGATED 0.9 MG PO TABS Oral Take 0.9 mg by mouth daily. Take daily for 21 days then do not take for 7 days.     . ENSURE PUDDING PO PUDG Oral Take 1 Container by mouth 3 (three) times daily between meals.    Marland Kitchen FOLIC ACID 1 MG PO TABS Oral Take 2 tablets (2 mg total) by mouth daily. 60 tablet 1  . FUROSEMIDE 20 MG PO TABS Oral Take 20 mg by mouth every other day.    Marland Kitchen GABAPENTIN 300 MG PO CAPS Oral Take 1 capsule (300 mg total) by mouth 3 (three) times daily. 90 capsule 6  . HYDROCODONE-ACETAMINOPHEN 5-500 MG PO TABS  Take 1 tablet by mouth three times daily as needed for pain--DO NOT EXCEED 3 PER DAY. 90 tablet 1  . HYDROCORTISONE 2.5 % RE CREA  Apply to rectal area after each BM 30 g 11  . HYDROXYCHLOROQUINE SULFATE 200 MG PO TABS Oral Take 200 mg by mouth daily. As directed by Dr. Corliss Skains     . HYPROMELLOSE 2.5 % OP SOLN Both Eyes Place 1 drop into both eyes 4 (four) times daily.    Marland Kitchen LANSOPRAZOLE 30 MG PO CPDR Oral Take 1 capsule (30 mg total) by mouth daily. 30 capsule  11  . LANSOPRAZOLE 30 MG PO CPDR  TAKE ONE CAPSULE BY MOUTH EVERY DAY 30 capsule 6  . LOTEPREDNOL ETABONATE 0.5 % OP  SUSP Both Eyes Place 1 drop into both eyes 2 (two) times daily.    Marland Kitchen MEMANTINE HCL 5 MG PO TABS Oral Take 1 tablet (5 mg total) by mouth 2 (two) times daily. 60 tablet 1  . METOPROLOL TARTRATE 50 MG PO TABS Oral Take 25 mg by mouth 2 (two) times daily.    Marland Kitchen ONE-DAILY MULTI VITAMINS PO TABS Oral Take 1 tablet by mouth daily.      . NYSTATIN 100000 UNIT/ML MT SUSP  USE 1 TEASPOONFUL TO GARGLE AND SWALLOW FOUR TIMES A DAY AS NEEDED 120 mL 5  . FISH OIL 1000 MG PO CAPS Oral Take 1 capsule by mouth daily.      Marland Kitchen PILOCARPINE HCL 5 MG PO TABS Oral Take 5 mg by mouth 2 (two) times daily. As directed by Dr. Corliss Skains     . POLYETHYLENE GLYCOL 3350 PO PACK Oral Take 17 g by mouth 2 (two) times daily.     . SENNA-DOCUSATE SODIUM 8.6-50 MG PO TABS Oral Take 2 tablets by mouth at bedtime.       BP 157/78  Pulse 79  Temp 99.2 F (37.3 C) (Oral)  Resp 20  SpO2 95%  Physical Exam  Constitutional: She is oriented to person, place, and time. She appears well-developed.  HENT:  Head: Normocephalic.  Eyes: Conjunctivae normal are normal.  Neck: No tracheal deviation present.  Cardiovascular:  No murmur heard. Musculoskeletal: Normal range of motion.       Tenderness to occipital head.  Lumbar tenderness.    Neurological: She is oriented to person, place, and time.  Skin: Skin is warm.  Psychiatric: She has a normal mood and affect.    ED Course  Procedures   DIAGNOSTIC STUDIES: Oxygen Saturation is 95 on room air, normal by my interpretation.    COORDINATION OF CARE:  12:16 Ordered: DG Ribs Unilateral W/Chest Right; CT Head Wo Contrast; CT Cervical Spine Wo Contrast; DG Sacrum/Coccyx 2:09 PM Recheck: Discussed imaging and course of care with pt.   Labs Reviewed - No data to display Dg Ribs Unilateral W/chest Right  11/21/2011  *RADIOLOGY REPORT*  Clinical Data: Pain, fell 3  weeks ago then again 1 week ago  RIGHT RIBS AND CHEST - 3+ VIEW  Comparison: Chest radiograph 07/18/2011  Findings: Enlargement of cardiac silhouette. Mediastinal contours and pulmonary vascularity normal. Emphysematous changes with linear scarring left base. No acute infiltrate, pleural effusion, or pneumothorax. Multiple views of the right ribs demonstrate diffuse osseous demineralization. No rib fracture or bone destruction. Minimal endplate spur formation thoracic spine.  IMPRESSION: Emphysematous changes with left basilar scarring. No definite acute osseous findings.   Original Report Authenticated By: Lollie Marrow, M.D.    Dg Sacrum/coccyx  11/21/2011  *RADIOLOGY REPORT*  Clinical Data: Tail bone pain, fell 3 weeks ago  SACRUM AND COCCYX - 2+ VIEW  Comparison: None  Findings: Symmetric SI joints. Bones appear demineralized. Multiple pelvic phleboliths. Minimally displaced coccygeal fracture.  IMPRESSION: Minimally displaced coccygeal fracture.   Original Report Authenticated By: Lollie Marrow, M.D.    Ct Head Wo Contrast  11/21/2011  *RADIOLOGY REPORT*  Clinical Data:  Fall.  Headache.  CT HEAD WITHOUT CONTRAST CT CERVICAL SPINE WITHOUT CONTRAST  Technique:  Multidetector CT imaging of the head and cervical spine was performed following the standard protocol without intravenous contrast.  Multiplanar CT image reconstructions of the cervical spine were also generated.  Comparison:  07/18/2011 head CT.  CT HEAD  Findings: No skull  fracture or intracranial hemorrhage.  Mild to moderate white matter type changes most notable left parietal lobe have progressed slightly since prior examination.  No CT evidence of large acute infarct.  No hydrocephalus.  No intracranial mass lesion detected on this unenhanced exam.  Vascular calcifications.  IMPRESSION: No skull fracture or intracranial hemorrhage.  CT CERVICAL SPINE  Findings: No cervical spine fracture.  Cervical spondylotic changes with various degrees of  spinal stenosis and foraminal narrowing.  Left thyroid 2.6 cm lesion.  This can be followed with thyroid ultrasound.  Scarring lung apices.  IMPRESSION: No cervical spine fracture.  Cervical spondylotic changes with various degrees of spinal stenosis and foraminal narrowing.  Left thyroid 2.6 cm lesion.  This can be followed with thyroid ultrasound.   Original Report Authenticated By: Fuller Canada, M.D.    Ct Cervical Spine Wo Contrast  11/21/2011  *RADIOLOGY REPORT*  Clinical Data:  Fall.  Headache.  CT HEAD WITHOUT CONTRAST CT CERVICAL SPINE WITHOUT CONTRAST  Technique:  Multidetector CT imaging of the head and cervical spine was performed following the standard protocol without intravenous contrast.  Multiplanar CT image reconstructions of the cervical spine were also generated.  Comparison:  07/18/2011 head CT.  CT HEAD  Findings: No skull fracture or intracranial hemorrhage.  Mild to moderate white matter type changes most notable left parietal lobe have progressed slightly since prior examination.  No CT evidence of large acute infarct.  No hydrocephalus.  No intracranial mass lesion detected on this unenhanced exam.  Vascular calcifications.  IMPRESSION: No skull fracture or intracranial hemorrhage.  CT CERVICAL SPINE  Findings: No cervical spine fracture.  Cervical spondylotic changes with various degrees of spinal stenosis and foraminal narrowing.  Left thyroid 2.6 cm lesion.  This can be followed with thyroid ultrasound.  Scarring lung apices.  IMPRESSION: No cervical spine fracture.  Cervical spondylotic changes with various degrees of spinal stenosis and foraminal narrowing.  Left thyroid 2.6 cm lesion.  This can be followed with thyroid ultrasound.   Original Report Authenticated By: Fuller Canada, M.D.      No diagnosis found.    MDM   The chart was scribed for me under my direct supervision.  I personally performed the history, physical, and medical decision making and all procedures  in the evaluation of this patient.Ariana Lennert, MD 11/21/11 [phone]

## 2011-11-21 NOTE — ED Notes (Signed)
Had a fall on Sunday and now has pain in back, back of head, hx of neuropathy and sts worse than normal.

## 2011-11-27 ENCOUNTER — Telehealth: Payer: Self-pay | Admitting: Pulmonary Disease

## 2011-11-27 DIAGNOSIS — F0390 Unspecified dementia without behavioral disturbance: Secondary | ICD-10-CM

## 2011-11-27 NOTE — Telephone Encounter (Signed)
Pt daughter advised. Samaya Boardley, CMA  

## 2011-11-27 NOTE — Telephone Encounter (Signed)
Called and spoke with pts daughter.  She stated that on the 24th they had to go back to the urologist and they did treat her with 4 days of cipro.   Daughter stated that the pt has had another personality change.  The culture came back and they treated her with another 6 days of cipro.   Pt fell out of the bed on Sunday.  Pt refused to be seen in the ER for the back pain that she stated that she was having.  Pt seems very angry all the time. Daughter is not sure if this is a normal thing with the dementia or if something else is going on.  SN please advise.  Thanks  Allergies  Allergen Reactions  . Codeine Nausea Only    unless given with Phenergan  . Doxycycline     Unknown  . Klonopin (Clonazepam)     Causes hallucination   . Meperidine Hcl Nausea Only    unless given with Phenergan  . Naproxen   . Norflex (Orphenadrine Citrate) Nausea Only    Unless given with Phenergan  . Oxycodone-Acetaminophen Nausea Only    unless given with phenergan  . Penicillins     Unknown  . Phenothiazines     Unknown  . Propoxyphene Hcl Nausea Only    unless given with phenergan  . Stelazine     Unknown  . Sulfamethoxazole W-Trimethoprim     Unknown  . Tolectin (Tolmetin Sodium)     Unknown  . Tramadol     Unknown  . Zoloft (Sertraline Hcl)     Caused pt to sleep a lot  . Lubiprostone Rash

## 2011-11-27 NOTE — Telephone Encounter (Signed)
Per SN---sounds like dementia with behavioral changes and this will need a follow up with neurology for eval ASAP.  Order has been placed for pt to get an appt with neurology. Thanks

## 2011-11-29 ENCOUNTER — Other Ambulatory Visit: Payer: Self-pay | Admitting: Pulmonary Disease

## 2011-12-01 ENCOUNTER — Telehealth: Payer: Self-pay | Admitting: Pulmonary Disease

## 2011-12-01 DIAGNOSIS — E041 Nontoxic single thyroid nodule: Secondary | ICD-10-CM

## 2011-12-01 DIAGNOSIS — R937 Abnormal findings on diagnostic imaging of other parts of musculoskeletal system: Secondary | ICD-10-CM

## 2011-12-01 NOTE — Telephone Encounter (Signed)
Pt was seen in ED on 11-22-11 and had a CT of C-spine and it showed the following: Left thyroid 2.6 cm lesion. This can be followed with thyroid  Ultrasound. Pt daughter is wanting to know if this can be ordered or what the next step would be. Please advise. Carron Curie, CMA  CT report printed.

## 2011-12-01 NOTE — Telephone Encounter (Signed)
Per SN----  1.  Thyroid nodule will need thyroid ultrasound 2.  cspine w/o fracture but does show cervical changes----pt will need MRI cspine and we will set her up to see neuro surgery for further eval.

## 2011-12-01 NOTE — Telephone Encounter (Signed)
Attempted to call pts daughter and unable to leave a message.  Will try back later.

## 2011-12-01 NOTE — Telephone Encounter (Signed)
ATC, NA, no voicemail. WCB. Ariana [name], CMA  

## 2011-12-05 ENCOUNTER — Other Ambulatory Visit: Payer: Self-pay | Admitting: Pulmonary Disease

## 2011-12-05 NOTE — Telephone Encounter (Signed)
[email or web address] for the pts daughter.

## 2011-12-05 NOTE — Telephone Encounter (Signed)
Spoke with the pt's daughter Suzannah and notified of results/recs per SN She verbalized understanding All orders were sent to [facility]

## 2011-12-05 NOTE — Telephone Encounter (Signed)
Returning call.

## 2011-12-11 ENCOUNTER — Other Ambulatory Visit (HOSPITAL_COMMUNITY): Payer: Medicare Other

## 2011-12-12 ENCOUNTER — Other Ambulatory Visit (HOSPITAL_COMMUNITY): Payer: Medicare Other

## 2011-12-13 ENCOUNTER — Ambulatory Visit (HOSPITAL_COMMUNITY)
Admission: RE | Admit: 2011-12-13 | Discharge: 2011-12-13 | Disposition: A | Discharge status: Home / Self Care | Payer: Medicare Other | Source: Ambulatory Visit | Attending: Pulmonary Disease | Admitting: Pulmonary Disease

## 2011-12-13 DIAGNOSIS — E049 Nontoxic goiter, unspecified: Secondary | ICD-10-CM | POA: Insufficient documentation

## 2011-12-13 DIAGNOSIS — M47812 Spondylosis without myelopathy or radiculopathy, cervical region: Secondary | ICD-10-CM | POA: Insufficient documentation

## 2011-12-13 DIAGNOSIS — M5030 Other cervical disc degeneration, unspecified cervical region: Secondary | ICD-10-CM | POA: Insufficient documentation

## 2011-12-13 DIAGNOSIS — E041 Nontoxic single thyroid nodule: Secondary | ICD-10-CM | POA: Insufficient documentation

## 2011-12-13 DIAGNOSIS — R937 Abnormal findings on diagnostic imaging of other parts of musculoskeletal system: Secondary | ICD-10-CM

## 2011-12-13 DIAGNOSIS — R209 Unspecified disturbances of skin sensation: Secondary | ICD-10-CM | POA: Insufficient documentation

## 2011-12-13 DIAGNOSIS — R29898 Other symptoms and signs involving the musculoskeletal system: Secondary | ICD-10-CM | POA: Insufficient documentation

## 2011-12-14 ENCOUNTER — Other Ambulatory Visit: Payer: Self-pay | Admitting: Pulmonary Disease

## 2011-12-14 DIAGNOSIS — E041 Nontoxic single thyroid nodule: Secondary | ICD-10-CM

## 2011-12-26 ENCOUNTER — Ambulatory Visit
Admission: RE | Admit: 2011-12-26 | Discharge: 2011-12-26 | Disposition: A | Discharge status: Home / Self Care | Payer: Medicare Other | Source: Ambulatory Visit | Attending: Pulmonary Disease | Admitting: Pulmonary Disease

## 2011-12-26 ENCOUNTER — Other Ambulatory Visit (HOSPITAL_COMMUNITY)
Admission: RE | Admit: 2011-12-26 | Discharge: 2011-12-26 | Disposition: A | Discharge status: Home / Self Care | Payer: Medicare Other | Source: Ambulatory Visit | Attending: Interventional Radiology | Admitting: Interventional Radiology

## 2011-12-26 DIAGNOSIS — E049 Nontoxic goiter, unspecified: Secondary | ICD-10-CM | POA: Insufficient documentation

## 2011-12-26 DIAGNOSIS — E041 Nontoxic single thyroid nodule: Secondary | ICD-10-CM

## 2012-01-10 ENCOUNTER — Encounter: Payer: Self-pay | Admitting: *Deleted

## 2012-01-15 ENCOUNTER — Encounter: Payer: Self-pay | Admitting: Pulmonary Disease

## 2012-01-15 ENCOUNTER — Ambulatory Visit (INDEPENDENT_AMBULATORY_CARE_PROVIDER_SITE_OTHER): Payer: Medicare Other | Admitting: Pulmonary Disease

## 2012-01-15 VITALS — BP 124/72 | HR 66 | Temp 98.2°F | Ht 63.5 in | Wt 115.0 lb

## 2012-01-15 DIAGNOSIS — M542 Cervicalgia: Secondary | ICD-10-CM | POA: Insufficient documentation

## 2012-01-15 DIAGNOSIS — M3500 Sicca syndrome, unspecified: Secondary | ICD-10-CM

## 2012-01-15 DIAGNOSIS — I10 Essential (primary) hypertension: Secondary | ICD-10-CM

## 2012-01-15 DIAGNOSIS — M545 Low back pain, unspecified: Secondary | ICD-10-CM

## 2012-01-15 DIAGNOSIS — K219 Gastro-esophageal reflux disease without esophagitis: Secondary | ICD-10-CM

## 2012-01-15 DIAGNOSIS — I059 Rheumatic mitral valve disease, unspecified: Secondary | ICD-10-CM

## 2012-01-15 DIAGNOSIS — E042 Nontoxic multinodular goiter: Secondary | ICD-10-CM | POA: Insufficient documentation

## 2012-01-15 DIAGNOSIS — G589 Mononeuropathy, unspecified: Secondary | ICD-10-CM

## 2012-01-15 DIAGNOSIS — E7800 Pure hypercholesterolemia, unspecified: Secondary | ICD-10-CM

## 2012-01-15 DIAGNOSIS — K5730 Diverticulosis of large intestine without perforation or abscess without bleeding: Secondary | ICD-10-CM

## 2012-01-15 DIAGNOSIS — IMO0001 Reserved for inherently not codable concepts without codable children: Secondary | ICD-10-CM

## 2012-01-15 DIAGNOSIS — R413 Other amnesia: Secondary | ICD-10-CM

## 2012-01-15 DIAGNOSIS — K589 Irritable bowel syndrome without diarrhea: Secondary | ICD-10-CM

## 2012-01-15 DIAGNOSIS — F341 Dysthymic disorder: Secondary | ICD-10-CM

## 2012-01-15 NOTE — Progress Notes (Signed)
Subjective:    Patient ID: Ariana [name], female    DOB: Jul 04, 1929, 78 y.o.   MRN: [id number]  HPI 78 y/o WF here for a follow up visit... he has multiple medical problems as noted below...   ~  January 13, 2011:  69mo ROV & she is here w/ relative again and there is come conflict regarding her care needs etc; they mentioned DrRendall eval & attempts to get PT eval, had home care assessment but I really didn't understand where the problem lied- ?she wanted maid service, of course medicare doesn't provide this, etc;  After long conversation we left it at calling [facility] for home PT/ OT if indeed she is eligible; they make note of the fact that DrDeveshwar is for the autoimmune dis & DrRendall is for the DDD, leg pain & he prev ordered the "therapy"; recall that she prev saw DrHickling for the Neuropathy & he started the Gabapentin...      From the medical perspective> BP well regulated on ToprolXL & Diltiazem; she denies CP, palpit, SOB or edema- ?when she saw DrLittle last & still on ASA/ Lanoxin under his direction & she denies cerebral ischemic symptoms; still trying to control lipids w/ diet alone (see below); GI stable on meds listed;  Her CC is her Sjogren's, and Neuropathy which she says is coming from her back per DrRendall- rec to incr the Neurontin back to 300mg  Bid;  Family is concerned about her depression & she no longer sees DrCottle (angry talking about their last meeting)> offered ZOLOFT 25-50mg  daily as a trial & they agree...  ~  Jul 14, 2011:  69mo ROV & Ariana [name] is here w/ her daughter who is her caregiver; there is much conflict w/ pt being argumentative & set in her ways, resisting help from daugh, etc; especially regarding her meds> daughter notes several meds caused reactions- staggering, hallucinations, confusion (eg- Zoloft, Klonopin); pt takes Xanax as needed (admin by family) & she is really angry about not getting more pain medicine when she wants it; we discussed these issues but she  has early dementia & doesn't "get-it", daugh requested Alz med & we will start DONEPIZIL 5mg /d for 2mo the 10mg /d thereafter...  Pt spent an inordinate amt of time ranting about her teeth, dentures, implants, TMJ, her severe dry mouth, etc...    We reviewed prob list, meds, xrays and labs> see below>> LABS 5/13:  Chems- ok w/ BS=105 Na=132;  CBC- wnl;  Fe=123 (30%sat);  TSH=1.56; Urine- +UTI ==> we will Rx w/ Cipro250Bid...  ~  August 02, 2011:  3wk [place] & post hospital visit>  She was Adm 6/4 - 07/25/11 by [facility] w/ UTI & urinary retension, altered mental status, & her mult other medical problems;  Her urine was abn but no growth on cult & she improved w/ Ceftin;  She had a foley placed but this was not discontinued at disch to the rehab facility- she was sent w/ it & has had f/u w/ Urology for removal;  She had just been started on Aricept (but hadn't yet taken her 1st pill) & was switched to Namenda 5mg Bid;  She was seen by Psychiatry DrBogard and started on Buspar & Cymbalta- but they do not see outpts so we will have to refer to an office based Psychiatrist for on-going help;  She was also disch on SSI but her sugars were only minimally elev & A1c=5.7, and we will stop the insulin & monitor sugars on diet alone.Marland KitchenMarland Kitchen  We reviewed prob list, meds, xrays and labs> see below>> CXR 6/13 showed atx & scarring LLL, NAD... CT Brain 6/13 showed cortical atrophy, NAD... CT Abd 6/13 showed distented bladder, constip, s/p hyst, left renal cyst, diverticulosis & some wall thickening in transverse colon... EKG 6/13 showed NSR, rate74, LAD, minor NSSTTWA... LABS 6/13:  CBC- Hg=11-12, WBC=6-7K;  Sed=16;  Chems- ok x elev BS (90-160), A1c=5.7;  FLP- at goals;  TFTs= wnl...  ~  August 31, 2011:  29mo ROV & Ariana [name] feels like she's a new person- improving, getting therapy, not talking to herself anymore; unfortunately she has been unable to see an outpt psychiatrist & asking social services at [facility] for help...  Daughter has been helpful trying to get her the psychiatric care she needs (recall that she saw Neuro- DrReynolds (Dementia) & Psyche- DrBogard (started on Buspar & Cymbalta) during her recent hosp)...    We reviewed prob list, meds, xrays and labs> see below see below >>  ~  November 03, 2011:  35mo ROV & daugh is frustrated w/ pt (dementia, behavioral issues) & difficulty arranging for outpt psychiatric care- she had appt w/ DrPlovsky but it's been moved out twice... Pt is now at home but actually liked being at [facility] & their rehab program "I had to leave"; she & daugh seem to be at odds esp re pt's dementia; since ret home she has fallen w/ pain in her tailbone & we discussed mentioning this to DrDeveshwar for poss XRays & to see what could possibly be done for that; they request refill of her Gabapentin 300mg Tid...  She notes several ecchymoses "THEY ARE NOT BRUISES" she emphasizes;  BP has been intermittently elev at home since disch from the NH & measures 142/70; furthermore they note intermittent swelling in ankles & we discussed compromise by adding LASIX 20mg  Qod...    She has a f/u appt coming up w/ DrHilty at [facility] (DrLittle has retired)...    She saw Urology 6/13> bladder distention w/ poor emptying; she was disch w/ foley, & had that eventually removed w/ improved voiding & no signif PVR...     We reviewed prob list, meds, xrays and labs> see below for updates >> ADDENDUM:  ThyroidSonar 11/13> dominant left thyroid nodule biopsied ==> c/w hyperplastic nodule, no malig cells ident...  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    HBP> on  Metop50-1/2Bid, Diltiazem180, Lasix20Qod, & DrHilty added Lisinopril20;  BP= 124/72 & she has mult symptoms and complaints...    MVP, CWP, Palpit> on ASA81, Lanoxin.125; followed by DrHilty now that DrLittle has retired- seen 11/13 & note reviewed...    Chol> on diet, FishOil & FlaxSeedOil; last FLP in Epic was 6/13 w/ TChol 128, TG 56, HDL 61, LDL 56    Multinod Goiter> w/ Bx of dom 4cm left lobe nodule= hyperplastic;  Thyroid function has been wnl w/ TSH 6/13= 1.16 (normal FreeT3 & FreeT4).    GI- GERD, Divertics, IBS, +FamHx colon ca> on Prev30, Bentyl20, Miralax/Senakot-S, AnusolHC; last colonoscopy 2010 by DrGessner w/ divertics, hems, melanosis...    Urinary incont> on Trimethoprim for UTI prevention per  Urology...    FM, LBP, fall w/ fx coccyx (ER 10/13)> MRI of Cspine 10/13 showedCx spondylosis & mild sp stenosis- referred to Neurosurg & eval pending...    Dementia> on Namenda10Bid per DrWillis...    Neuropathy> on Gabapentin 300Tid w/ sl improvement in her symptoms...    Anxiety, Depression> followed by DrPlovsky- on Cymbalta60, Buspar10Tid, Xanax0.5prn...  We reviewed prob list, meds, xrays and labs> see below for updates >>            Problem List:  SJOGREN'S SYNDROME (ICD-710.2), & DRY EYE SYNDROME (ICD-375.15) - eval by DrHecker & DrDeveshwar 5/11 w/ extensive lab work done & Dx of Sjogren's syndrome & started on Pilocarpine 5mg Bid... DrHecker has her on a number of eye drops, & prev thought to be dry eyes w/ ocular rosacea & tubes placed in tear ducts... also seen by Mikey Kirschner...  ~  10/11:  DrDeveshwar started PLAQUENIL 200mg /d... Seen every 76mo by Rheum. ~  Most recent note from [facility] 4/13> Sjogren's, Raynaud's, LBP, musc spasms, on Plaquenil Rx & no changes made.  CHRONIC ORAL DISCOMFORT - this has been looked into by mult physicians and no other etiology discovered (other than her sicca syndrome)... she uses Magic Mouthwash Prn + sialogogues...  HYPERTENSION, BENIGN  (ICD-401.1) - controlled on TOPROL XL 50mg - 1/2 tabBid, DILTIAZEM CD 180mg /d, & LASIX 20mg  Qod...   ~  5/12: BP= 134/80 today, takes meds regularly & tol well... denies CP, worsening palipit, syncope, dyspnea, edema, etc... ~  11/12: BP= 122/60 & she remains asymptomatic... ~  5/13:  BP= 122/70 & she denies CP, palpit, change in SOB, edema, etc... ~  CXR 6/13 showed stable bronchitic changes w/ thickening of airways, scarring at lung bases, no acute changes.  MITRAL VALVE PROLAPSE (ICD-424.0) - on ASA 81mg /d, & LANOXIN 0.125mg /d and followed by DrLittle for Cardiology... his notes are reviewed> MVP, HBP, hx palpit, neg cath... ~  cath 1/05 by DrLittle showed clean coronaries & norm LVF w/ +MVP... ~  1/11:  Seen by DrLittle for f/u MVP & palpit; BP was sl elev & he rec incr Metoprolol to 25mg  Bid.  HYPERLIPIDEMIA>  On low chol, low fat diet alone... She takes Fish Oil, flax seed oil, etc. ~  2008:  FLP showed TChol 170, TG 176, HDL 50,  ~  5/12:  FLP showed TChol 142, TG 202, HDL 62, LDL 166 ~  FLP 6/13 showed TChol 128, TG 56, HDL 61, LDL 56  Elev Blood Sugar >> occurred during the 6/13 hosp & covered by SSI during the hosp & sent to rehab center w/ this==> discontinued on office follow up...  MULTINODULAR THYROID >> Asymptomatic thyroid nodule ident on MRI scan of neck... ~  10/13:  Thyroid Ultrasound showed multinodular gland w/ dominant nodule measuring 4cm on left lobe... ~  11/13:  Thyroid Biopsy by IR> neg- c/w hyperplastic nodule, no malig cells seen...  GERD (ICD-530.81) - on PREVACID 30mg /d... denies reflux symptoms, swallowing OK x for dryness, etc... ~  last EGD 9/02 showed 4cmHH, GERD...  DIVERTICULOSIS OF COLON (ICD-562.10),  IRRITABLE BOWEL SYNDROME (ICD-564.1), & Family Hx of COLON CANCER (ICD-153.9) - followed for GI by DrSamLeBauer- now DrGessner- colonoscopy 2/06 showed a tortuous and spastic colon, divertics, otherw neg... her sister died in her 82's w/ colon cancer... f/u  colonoscopy 4/10 showed mod divertics, hems, melanosis> Rx'd w/ Miralax/ Senakot-S. ~  11/11:  she notes incr IBS symptoms- rec Rx w/ BENTYL 20mg Qid Prn, Mylicon, Phazyme ==> improved.  URINARY INCONTINENCE (ICD-788.30) - eval by GYN= DrRNeal (on Premarin 0.9mg /d)... ~  7/13:  Foley discontinued, voiding better w/ freq trips to the commode to prevent leaking...  LOW BACK PAIN SYNDROME (ICD-724.2) - on VICODIN up to 3/ day... Ortho= DrMortenson, Rendall, et al... ~  5/12:  She is c/o incr pain in right side of back/ hip/ leg> refer to Ortho for further eval; Rx Vicodin prn...  FIBROMYALGIA (ICD-729.1) - she has chr fatigue symptoms and diffuse aches & pains... she has been eval by Rheum DrDeveshwar w/ recent dx of Sjogren's Syndrome & DDD- on CYCLOBENZAPRINE 10mg - 1/2-1 tab Prn...  MEMORY LOSS (ICD-780.93) - eval by DrHickling for Neurology w/ MRI done- pt states "it's not dementia, just hardening of the arteries"... his note from 4/08 indicates- OBS, gait abn & neck/ Back pain diagnoses... ~  5/13:  Daughter notes more difficulty & ready to start medication ==> Aricept 5mg /d was recommended but never started by the pt... ~  6/13:  The hospitalists started [facility] 5mg Bid in lieu of the Aricept ==> memory is no better per daughter ~  7/13:  c/o headaches and taking Vicodin as needed...  NEUROPATHY (ICD-355.9) - on GABAPENTIN 100mg Tid w/ some relief; initially started on larger dose (300mg Bid) per DrHickling, but she was intol w/ confusion & slurred speech... neuropathy symptoms improved on Rx. ~  6/13:  She was disch from hosp on larger dose of Neurontin 100mg - 3Tid (subseq changed to 300mg tabTid)  ANXIETY DEPRESSION (ICD-300.4) - on BUSPAR10mg Tid & CYMBALTA 60mg /d & Alpraz0.5mg  as needed... prev Psychiatric Rx from DrCottle but she hasn't seen him in >55yr & refuses to return... Family requested med for depression & we tried Zoloft 25 to 50mg  /d but they stopped it due to affect on her gait &  speech... Note> Klonopin in past caused hallucinations, but she tolerates the intermittent Xanax ok... ~  6/13:  She was seen by DrBogard, Psychiatrist during the 6/13 hosp & will be referred for outpt follow up... ~  Daugh has arranged for f/u appt w/ DrPlovsky => finally seen and he increased the Cymbalta to 60mg /d...  Hx of ADVERSE DRUG REACTION (ICD-995.20) - hx of mult drug allergies and intolerances including:  PCN, Sulfa, Doxy, NSAIDs, Demerol, Codeine, DCN100, Tramadol, Phenergan, Thorazine, Stelazine...  NOTE: she takes Vicodin for pain, and tolerates this satis...  Health Maintenance - GYN= DrNeal on Premarin, Calcium, Vits, & Fish Oil...   Past Surgical History  Procedure Date  . Vesicovaginal fistula closure w/ tah   . Appendectomy   . Cholecystectomy   . Mandible surgery   . Temporomandibular joint surgery   . Cataract extraction, bilateral   . Abdominal hysterectomy   . Dental surgery     multiple tooth extractions    Outpatient Encounter Prescriptions as of 01/15/2012  Medication Sig Dispense Refill  . ALPRAZolam (XANAX) 0.5 MG tablet Take 1 tablet (0.5 mg total) by mouth 2 (two) times daily as needed for anxiety. For anxiety  60 tablet  5  . Alum & Mag Hydroxide-Simeth (MAGIC MOUTHWASH W/LIDOCAINE) SOLN Take 5 mLs by mouth 4 (four) times daily as needed.      Marland Kitchen aspirin 81 MG tablet Take 81 mg by mouth daily.        . busPIRone (BUSPAR) 10 MG tablet TAKE 1 TABLET THREE TIMES A DAY  90 tablet  5  . Calcium Carbonate-Vitamin D (CALTRATE 600+D) 600-400 MG-UNIT per tablet Take 2 tablets by mouth daily.        Marland Kitchen  carboxymethylcellulose (REFRESH TEARS) 0.5 % SOLN 1 drop 2 (two) times daily.        . cyclobenzaprine (FLEXERIL) 10 MG tablet Take 1/2 to 1 tablet by mouth 4 times daily as needed for msucle spasm  120 tablet  5  . cycloSPORINE (RESTASIS) 0.05 % ophthalmic emulsion Place 1 drop into both eyes every 12 (twelve) hours.       . CYMBALTA 20 MG capsule TAKE 2 CAPSULES BY  MOUTH DAILY  60 capsule  6  . digoxin (LANOXIN) 0.125 MG tablet Take 125 mcg by mouth daily.        Marland Kitchen diltiazem (CARDIZEM CD) 180 MG 24 hr capsule Take 1 capsule (180 mg total) by mouth daily.  30 capsule  11  . estrogens, conjugated, (PREMARIN) 0.9 MG tablet Take 0.9 mg by mouth daily. Take daily for 21 days then do not take for 7 days.       . feeding supplement (ENSURE) PUDG Take 1 Container by mouth 3 (three) times daily between meals.      . folic acid (FOLVITE) 1 MG tablet TAKE 1 TABLET TWICE A DAY  60 tablet  6  . furosemide (LASIX) 20 MG tablet Take 20 mg by mouth every other day.      . furosemide (LASIX) 20 MG tablet TAKE 1 TABLET BY MOUTH EVERY MORNING AS NEEDED FOR SWELLING  30 tablet  6  . gabapentin (NEURONTIN) 300 MG capsule Take 1 capsule (300 mg total) by mouth 3 (three) times daily.  90 capsule  6  . HYDROcodone-acetaminophen (VICODIN) 5-500 MG per tablet Take 1 tablet by mouth three times daily as needed for pain--DO NOT EXCEED 3 PER DAY.  90 tablet  1  . hydrocortisone (ANUSOL-HC) 2.5 % rectal cream Apply to rectal area after each BM  30 g  11  . hydroxychloroquine (PLAQUENIL) 200 MG tablet Take 200 mg by mouth daily. As directed by Dr. Corliss Skains       . hydroxypropyl methylcellulose (ISOPTO TEARS) 2.5 % ophthalmic solution Place 1 drop into both eyes 4 (four) times daily.      . lansoprazole (PREVACID) 30 MG capsule Take 1 capsule (30 mg total) by mouth daily.  30 capsule  11  . lansoprazole (PREVACID) 30 MG capsule TAKE ONE CAPSULE BY MOUTH EVERY DAY  30 capsule  6  . loteprednol (LOTEMAX) 0.5 % ophthalmic suspension Place 1 drop into both eyes 2 (two) times daily.      . metoprolol (LOPRESSOR) 50 MG tablet Take 25 mg by mouth 2 (two) times daily.      . Multiple Vitamin (MULTIVITAMIN) tablet Take 1 tablet by mouth daily.        Marland Kitchen NAMENDA 5 MG tablet TAKE 1 TABLET TWICE A DAY  60 tablet  6  . nystatin (MYCOSTATIN) 100000 UNIT/ML suspension USE 1 TEASPOONFUL TO GARGLE AND  SWALLOW FOUR TIMES A DAY AS NEEDED  120 mL  5  . Omega-3 Fatty Acids (FISH OIL) 1000 MG CAPS Take 1 capsule by mouth daily.        Marland Kitchen oxyCODONE-acetaminophen (PERCOCET/ROXICET) 5-325 MG per tablet Take 1 tablet by mouth every 4 (four) hours as needed for pain.  15 tablet  0  . pilocarpine (SALAGEN) 5 MG tablet Take 5 mg by mouth 2 (two) times daily. As directed by Dr. Corliss Skains       . polyethylene glycol (MIRALAX / GLYCOLAX) packet Take 17 g by mouth 2 (two) times daily.       Marland Kitchen  sennosides-docusate sodium (SENOKOT-S) 8.6-50 MG tablet Take 2 tablets by mouth at bedtime.         Allergies  Allergen Reactions  . Codeine Nausea Only    unless given with Phenergan  . Doxycycline     Unknown  . Klonopin (Clonazepam)     Causes hallucination   . Meperidine Hcl Nausea Only    unless given with Phenergan  . Naproxen   . Norflex (Orphenadrine Citrate) Nausea Only    Unless given with Phenergan  . Oxycodone-Acetaminophen Nausea Only    unless given with phenergan  . Penicillins     Unknown  . Phenothiazines     Unknown  . Propoxyphene Hcl Nausea Only    unless given with phenergan  . Stelazine     Unknown  . Sulfamethoxazole W-Trimethoprim     Unknown  . Tolectin (Tolmetin Sodium)     Unknown  . Tramadol     Unknown  . Zoloft (Sertraline Hcl)     Caused pt to sleep a lot  . Lubiprostone Rash    Current Medications, Allergies, Past Medical History, Past Surgical History, Family History, and Social History were reviewed in [employer] record.    Review of Systems        See HPI - all other systems neg except as noted... The patient complains of decreased hearing, hoarseness, dyspnea on exertion, and abdominal pain.  The patient denies anorexia, fever, weight loss, weight gain, vision loss, chest pain, syncope, peripheral edema, prolonged cough, headaches, hemoptysis, melena, hematochezia, severe indigestion/heartburn, hematuria, incontinence, muscle  weakness, suspicious skin lesions, transient blindness, difficulty walking, depression, unusual weight change, abnormal bleeding, enlarged lymph nodes, and angioedema.     Objective:   Physical Exam      WD, WN, 78 y/o WF in NAD... she has dry eyes & prominent dry mouth symptoms... GENERAL:  Alert & oriented; pleasant & cooperative...  HEENT:  [place]/AT, EOM-wnl, PERRLA, EACs-clear, TMs-wnl, NOSE-clear, THROAT-clear & excessively dry. NECK:  Supple w/ fairROM; no JVD; normal carotid impulses w/o bruits; no thyromegaly or nodules palpated; no lymphadenopathy. CHEST:  Clear to P & A; without wheezes/ rales/ or rhonchi. HEART:  Regular Rhythm; without murmurs/ rubs/ or gallops heard... ABDOMEN:  Soft & nontender; normal bowel sounds; no organomegaly or masses detected. EXT: without deformities, mild arthritic changes; no varicose veins/ venous insuffic/ or edema. NEURO:  CN's intact;  no focal neuro deficits... DERM:  No lesions noted; no rash etc...  RADIOLOGY DATA:  Reviewed in the EPIC EMR & discussed w/ the patient...  LABORATORY DATA:  Reviewed in the EPIC EMR & discussed w/ the patient...   Assessment & Plan:    SJOGREN's>  Still w/ very dry Sicca syndrome & medication from DrDeveshwar, Ophthalmology, etc...  HBP>  Controlled on meds, continue same but add regular dosing of the Lasix 20mg  Qod...  MVP>  Followed by DrLittle/Hilty for Cards, & stable on meds above...  HYPERLIPID>  TG sl elev & rec to get on better low fat diet...  GI>  GERD, HH, Divertics, IBS, +FemHx colon ca>  Stable on Prev30, last colon 2010- OK, Bentyl helps...  UTI / Urinary Retension>  She was hosp 6/13 by [facility]- UTI resolved w/ Ab & Foley removed by Urology, DrOttelin & she has f/u appts  Chronic Pain Syndrome>  She states adeq control w/ Vicodin Rx...  Memory loss/ anxiety/ depression>  Prev eval by Neuro- DrHickling, DrCottle for psyche in past;  During the 6/13  Hosp- she was seen by DrBogard for    Psyche & placed on Buspar & Cymbalta; needs outpt f/u by Psyche=> DrPlovsky...   Patient's Medications  New Prescriptions   No medications on file  Previous Medications   ALPRAZOLAM (XANAX) 0.5 MG TABLET    Take 1 tablet (0.5 mg total) by mouth 2 (two) times daily as needed for anxiety. For anxiety   ALUM & MAG HYDROXIDE-SIMETH (MAGIC MOUTHWASH W/LIDOCAINE) SOLN    Take 5 mLs by mouth 4 (four) times daily as needed.   ASPIRIN 81 MG TABLET    Take 81 mg by mouth daily.     BUSPIRONE (BUSPAR) 10 MG TABLET    TAKE 1 TABLET THREE TIMES A DAY   CALCIUM CARBONATE-VITAMIN D (CALTRATE 600+D) 600-400 MG-UNIT PER TABLET    Take 2 tablets by mouth daily.     CARBOXYMETHYLCELLULOSE (REFRESH TEARS) 0.5 % SOLN    1 drop 2 (two) times daily.     CYCLOBENZAPRINE (FLEXERIL) 10 MG TABLET    Take 1/2 to 1 tablet by mouth 4 times daily as needed for msucle spasm   CYCLOSPORINE (RESTASIS) 0.05 % OPHTHALMIC EMULSION    Place 1 drop into both eyes every 12 (twelve) hours.    CYMBALTA 20 MG CAPSULE    TAKE 2 CAPSULES BY MOUTH DAILY   DIGOXIN (LANOXIN) 0.125 MG TABLET    Take 125 mcg by mouth daily.     ESTROGENS, CONJUGATED, (PREMARIN) 0.9 MG TABLET    Take 0.9 mg by mouth daily. Take daily for 21 days then do not take for 7 days.    FEEDING SUPPLEMENT (ENSURE) PUDG    Take 1 Container by mouth 3 (three) times daily between meals.   FOLIC ACID (FOLVITE) 1 MG TABLET    TAKE 1 TABLET TWICE A DAY   FUROSEMIDE (LASIX) 20 MG TABLET    Take 20 mg by mouth every other day.   FUROSEMIDE (LASIX) 20 MG TABLET    TAKE 1 TABLET BY MOUTH EVERY MORNING AS NEEDED FOR SWELLING   GABAPENTIN (NEURONTIN) 300 MG CAPSULE    Take 1 capsule (300 mg total) by mouth 3 (three) times daily.   HYDROCORTISONE (ANUSOL-HC) 2.5 % RECTAL CREAM    Apply to rectal area after each BM   HYDROXYCHLOROQUINE (PLAQUENIL) 200 MG TABLET    Take 200 mg by mouth daily. As directed by Dr. Corliss Skains    HYDROXYPROPYL METHYLCELLULOSE (ISOPTO TEARS) 2.5 %  OPHTHALMIC SOLUTION    Place 1 drop into both eyes 4 (four) times daily.   LANSOPRAZOLE (PREVACID) 30 MG CAPSULE    Take 1 capsule (30 mg total) by mouth daily.   LANSOPRAZOLE (PREVACID) 30 MG CAPSULE    TAKE ONE CAPSULE BY MOUTH EVERY DAY   LISINOPRIL (PRINIVIL,ZESTRIL) 20 MG TABLET    Take 20 mg by mouth daily.   LOTEPREDNOL (LOTEMAX) 0.5 % OPHTHALMIC SUSPENSION    Place 1 drop into both eyes 2 (two) times daily.   METOPROLOL (LOPRESSOR) 50 MG TABLET    Take 25 mg by mouth 2 (two) times daily.   MULTIPLE VITAMIN (MULTIVITAMIN) TABLET    Take 1 tablet by mouth daily.     NAMENDA 5 MG TABLET    TAKE 1 TABLET TWICE A DAY   NYSTATIN (MYCOSTATIN) 100000 UNIT/ML SUSPENSION    USE 1 TEASPOONFUL TO GARGLE AND SWALLOW FOUR TIMES A DAY AS NEEDED   OMEGA-3 FATTY ACIDS (FISH OIL) 1000 MG CAPS    Take 1  capsule by mouth daily.     OXYCODONE-ACETAMINOPHEN (PERCOCET/ROXICET) 5-325 MG PER TABLET    Take 1 tablet by mouth every 4 (four) hours as needed for pain.   PILOCARPINE (SALAGEN) 5 MG TABLET    Take 5 mg by mouth 2 (two) times daily. As directed by Dr. Corliss Skains    POLYETHYLENE GLYCOL [facility] Ethelene Hal) PACKET    Take 17 g by mouth 2 (two) times daily.    SENNOSIDES-DOCUSATE SODIUM (SENOKOT-S) 8.6-50 MG TABLET    Take 2 tablets by mouth at bedtime.    TRIMETHOPRIM (TRIMPEX) 100 MG TABLET    Take 100 mg by mouth at bedtime.  Modified Medications   Modified Medication Previous Medication   DILTIAZEM (CARDIZEM CD) 180 MG 24 HR CAPSULE diltiazem (CARDIZEM CD) 180 MG 24 hr capsule      TAKE ONE CAPSULE BY MOUTH EVERY DAY    Take 1 capsule (180 mg total) by mouth daily.   HYDROCODONE-ACETAMINOPHEN (VICODIN) 5-500 MG PER TABLET HYDROcodone-acetaminophen (VICODIN) 5-500 MG per tablet      TAKE 1 TABLET 3 TIMES A DAY AS NEEDED FOR PAIN MAX DOSE 3 TABLETS PER DAY    Take 1 tablet by mouth three times daily as needed for pain--DO NOT EXCEED 3 PER DAY.  Discontinued Medications   No medications on file

## 2012-01-15 NOTE — Patient Instructions (Addendum)
Today we updated your med list in our EPIC system...    Continue your current medications the same...  We gave you the 2013 Flu vaccine today & wrote for a Shingles shot that you can fill at your local Pharm...  Keep up the good work, and call for any problems...  Let's plan a follow up visit in 4 months w/ FASTING blood work.Marland KitchenMarland Kitchen

## 2012-01-22 ENCOUNTER — Other Ambulatory Visit: Payer: Self-pay | Admitting: Pulmonary Disease

## 2012-03-09 ENCOUNTER — Other Ambulatory Visit: Payer: Self-pay | Admitting: Pulmonary Disease

## 2012-04-24 ENCOUNTER — Other Ambulatory Visit: Payer: Self-pay | Admitting: Pulmonary Disease

## 2012-04-26 ENCOUNTER — Other Ambulatory Visit: Payer: Self-pay | Admitting: Pulmonary Disease

## 2012-04-26 MED ORDER — HYDROCODONE-ACETAMINOPHEN 5-325 MG PO TABS: ORAL_TABLET | ORAL | Status: DC

## 2012-05-16 ENCOUNTER — Encounter: Payer: Self-pay | Admitting: Pulmonary Disease

## 2012-05-16 ENCOUNTER — Other Ambulatory Visit (INDEPENDENT_AMBULATORY_CARE_PROVIDER_SITE_OTHER): Payer: Medicare Other

## 2012-05-16 ENCOUNTER — Ambulatory Visit (INDEPENDENT_AMBULATORY_CARE_PROVIDER_SITE_OTHER): Payer: Medicare Other | Admitting: Pulmonary Disease

## 2012-05-16 VITALS — BP 128/62 | HR 54 | Temp 97.5°F | Ht 63.5 in | Wt 123.4 lb

## 2012-05-16 DIAGNOSIS — M545 Low back pain, unspecified: Secondary | ICD-10-CM

## 2012-05-16 DIAGNOSIS — R7309 Other abnormal glucose: Secondary | ICD-10-CM

## 2012-05-16 DIAGNOSIS — E042 Nontoxic multinodular goiter: Secondary | ICD-10-CM

## 2012-05-16 DIAGNOSIS — IMO0001 Reserved for inherently not codable concepts without codable children: Secondary | ICD-10-CM

## 2012-05-16 DIAGNOSIS — K589 Irritable bowel syndrome without diarrhea: Secondary | ICD-10-CM

## 2012-05-16 DIAGNOSIS — E7800 Pure hypercholesterolemia, unspecified: Secondary | ICD-10-CM

## 2012-05-16 DIAGNOSIS — I059 Rheumatic mitral valve disease, unspecified: Secondary | ICD-10-CM

## 2012-05-16 DIAGNOSIS — F341 Dysthymic disorder: Secondary | ICD-10-CM

## 2012-05-16 DIAGNOSIS — M3500 Sicca syndrome, unspecified: Secondary | ICD-10-CM

## 2012-05-16 DIAGNOSIS — K219 Gastro-esophageal reflux disease without esophagitis: Secondary | ICD-10-CM

## 2012-05-16 DIAGNOSIS — K5730 Diverticulosis of large intestine without perforation or abscess without bleeding: Secondary | ICD-10-CM

## 2012-05-16 DIAGNOSIS — I10 Essential (primary) hypertension: Secondary | ICD-10-CM

## 2012-05-16 DIAGNOSIS — G589 Mononeuropathy, unspecified: Secondary | ICD-10-CM

## 2012-05-16 DIAGNOSIS — R32 Unspecified urinary incontinence: Secondary | ICD-10-CM

## 2012-05-16 DIAGNOSIS — R413 Other amnesia: Secondary | ICD-10-CM

## 2012-05-16 LAB — CBC WITH DIFFERENTIAL/PLATELET
Basophils Absolute: 0 10*3/uL (ref 0.0–0.1)
Lymphocytes Relative: 27.5 % (ref 12.0–46.0)
Lymphs Abs: 2.3 10*3/uL (ref 0.7–4.0)
Monocytes Relative: 6.6 % (ref 3.0–12.0)
Platelets: 249 10*3/uL (ref 150.0–400.0)
RDW: 12.9 % (ref 11.5–14.6)

## 2012-05-16 LAB — LIPID PANEL
Cholesterol: 166 mg/dL (ref 0–200)
HDL: 62 mg/dL (ref >39.00)
VLDL: 22.4 mg/dL (ref 0.0–40.0)

## 2012-05-16 LAB — BASIC METABOLIC PANEL
BUN: 13 mg/dL (ref 6–23)
CO2: 33 mEq/L — ABNORMAL HIGH (ref 19–32)
Calcium: 9 mg/dL (ref 8.4–10.5)
GFR: 89.82 mL/min (ref >60.00)
Glucose, Bld: 96 mg/dL (ref 70–99)

## 2012-05-16 LAB — HEPATIC FUNCTION PANEL
AST: 19 U/L (ref 0–37)
Albumin: 3.7 g/dL (ref 3.5–5.2)
Total Protein: 7.1 g/dL (ref 6.0–8.3)

## 2012-05-16 LAB — TSH: TSH: 1.33 u[IU]/mL (ref 0.35–5.50)

## 2012-05-16 LAB — HEMOGLOBIN A1C: Hgb A1c MFr Bld: 5.7 % (ref 4.6–6.5)

## 2012-05-16 MED ORDER — FIRST-DUKES MOUTHWASH MT SUSP: OROMUCOSAL | Status: DC

## 2012-05-16 NOTE — Progress Notes (Signed)
Subjective:    Patient ID: Ariana [name], female    DOB: 08/13/1929, 79 y.o.   MRN: [id number]  HPI 79 y/o WF here for a follow up visit... he has multiple medical problems as noted below...   ~  Jul 14, 2011:  23mo ROV & Ariana [name] is here w/ her daughter who is her caregiver; there is much conflict w/ pt being argumentative & set in her ways, resisting help from daugh, etc; especially regarding her meds> daughter notes several meds caused reactions- staggering, hallucinations, confusion (eg- Zoloft, Klonopin); pt takes Xanax as needed (admin by family) & she is really angry about not getting more pain medicine when she wants it; we discussed these issues but she has early dementia & doesn't "get-it", daugh requested Alz med & we will start DONEPIZIL 5mg /d for 59mo the 10mg /d thereafter...  Pt spent an inordinate amt of time ranting about her teeth, dentures, implants, TMJ, her severe dry mouth, etc...    We reviewed prob list, meds, xrays and labs> see below>> LABS 5/13:  Chems- ok w/ BS=105 Na=132;  CBC- wnl;  Fe=123 (30%sat);  TSH=1.56; Urine- +UTI ==> we will Rx w/ Cipro250Bid...  ~  August 02, 2011:  3wk [place] & post hospital visit>  She was Adm 6/4 - 07/25/11 by [facility] w/ UTI & urinary retension, altered mental status, & her mult other medical problems;  Her urine was abn but no growth on cult & she improved w/ Ceftin;  She had a foley placed but this was not discontinued at disch to the rehab facility- she was sent w/ it & has had f/u w/ Urology for removal;  She had just been started on Aricept (but hadn't yet taken her 1st pill) & was switched to Namenda 5mg Bid;  She was seen by Psychiatry DrBogard and started on Buspar & Cymbalta- but they do not see outpts so we will have to refer to an office based Psychiatrist for on-going help;  She was also disch on SSI but her sugars were only minimally elev & A1c=5.7, and we will stop the insulin & monitor sugars on diet alone...      We reviewed prob list, meds,  xrays and labs> see below>> CXR 6/13 showed atx & scarring LLL, NAD... CT Brain 6/13 showed cortical atrophy, NAD... CT Abd 6/13 showed distented bladder, constip, s/p hyst, left renal cyst, diverticulosis & some wall thickening in transverse colon... EKG 6/13 showed NSR, rate74, LAD, minor NSSTTWA... LABS 6/13:  CBC- Hg=11-12, WBC=6-7K;  Sed=16;  Chems- ok x elev BS (90-160), A1c=5.7;  FLP- at goals;  TFTs= wnl...  ~  August 31, 2011:  59mo ROV & Ariana [name] feels like she's a new person- improving, getting therapy, not talking to herself anymore; unfortunately she has been unable to see an outpt psychiatrist & asking social services at [facility] for help... Daughter has been helpful trying to get her the psychiatric care she needs (recall that she saw Neuro- DrReynolds (Dementia) & Psyche- DrBogard (started on Buspar & Cymbalta) during her recent hosp)...    We reviewed prob list, meds, xrays and labs> see below see below >>  ~  November 03, 2011:  34mo ROV & daugh is frustrated w/ pt (dementia, behavioral issues) & difficulty arranging for outpt psychiatric care- she had appt w/ DrPlovsky but it's been moved out twice... Pt is now at home but actually liked being at [facility] & their rehab program "I had to leave"; she & daugh seem to  be at odds esp re pt's dementia; since ret home she has fallen w/ pain in her tailbone & we discussed mentioning this to DrDeveshwar for poss XRays & to see what could possibly be done for that; they request refill of her Gabapentin 300mg Tid...  She notes several ecchymoses "THEY ARE NOT BRUISES" she emphasizes;  BP has been intermittently elev at home since disch from the NH & measures 142/70; furthermore they note intermittent swelling in ankles & we discussed compromise by adding LASIX 20mg  Qod...    She has a f/u appt coming up w/ DrHilty at [facility] (DrLittle has retired)...    She saw Urology 6/13> bladder distention w/ poor emptying; she was disch w/ foley, & had that  eventually removed w/ improved voiding & no signif PVR...     We reviewed prob list, meds, xrays and labs> see below for updates >> ADDENDUM:  ThyroidSonar 11/13> dominant left thyroid nodule biopsied ==> c/w hyperplastic nodule, no malig cells ident...  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    HBP> on Metop50-1/2Bid, Diltiazem180, Lasix20Qod, & DrHilty added Lisinopril20;  BP= 124/72 & she has mult symptoms and complaints...    MVP, CWP, Palpit> on ASA81, Lanoxin.125; followed by DrHilty now that DrLittle has retired- seen 11/13 & note reviewed...    Chol> on diet, FishOil & FlaxSeedOil; last FLP in Epic was 6/13 w/ TChol 128, TG 56, HDL 61, LDL 56    Multinod Goiter> w/ Bx of dom 4cm left lobe nodule= hyperplastic;  Thyroid function has been wnl w/ TSH 6/13= 1.16 (normal FreeT3 & FreeT4).    GI- GERD, Divertics, IBS, +FamHx colon ca> on Prev30, Bentyl20, Miralax/Senakot-S, AnusolHC; last colonoscopy 2010 by DrGessner w/ divertics, hems, melanosis...    Urinary incont> on Trimethoprim for UTI prevention per Urology...    FM, LBP, fall w/ fx coccyx (ER 10/13)> MRI of Cspine 10/13 showedCx spondylosis & mild sp stenosis- referred to Neurosurg & eval pending...    Dementia> on Namenda10Bid per DrWillis...    Neuropathy> on Gabapentin 300Tid w/ sl improvement in her symptoms...    Anxiety, Depression> followed by DrPlovsky- on Cymbalta60, Buspar10Tid, Xanax0.5prn... We reviewed prob list, meds, xrays and labs> see below for updates >>   ~  May 16, 2012:  16mo ROV & Ariana [name] says "I ain't doin" & she notes incr swelling=> rec to incr Lasix20 from prn to one  daily;  She has persistent mult somatic complaints & daugh wonders about her depression, on low dose Cymbalta, & we decided to incr to 60mg /d... We reviewed the following medical problems during today's office visit >>      Sjogren's syndrome> followed by DrDeveshwar on Plaquenil200, + tears/ Restasis, Nystatin, etc for her chronic oral discomfort...    HBP> on Metop50-1/2Bid, Diltiazem180, Lisinopril20, Lasix20prn;  BP= 128/62 & she has mult symptoms and complaints w/o change...    MVP, CWP, Palpit> on ASA81, Lanoxin.125; followed by DrHilty now that DrLittle has retired- seen 11/13 & note reviewed...    Chol> on diet, FishOil & FlaxSeedOil; FLP shows TChol 166, TG 112, HDL 62, LDL 82    Multinod Goiter> w/ Bx  of dom 4cm left lobe nodule= hyperplastic;  Thyroid function has been wnl w/ TSH = 1.33 (normal FreeT3 & FreeT4).    GI- GERD, Divertics, IBS, +FamHx colon ca> on Prev30, Miralax/Senakot-S, AnusolHC; last colonoscopy 2010 by DrGessner w/ divertics, hems, melanosis...    Urinary incont> prev on Trimethoprim for UTI prevention per Urology; DrDahlstedt added Myrbetriq50 & she is improved...    FM, LBP, fall w/ fx coccyx (ER 10/13)> MRI of Cspine 10/13 showed Cx spondylosis & mild sp stenosis- referred to Neurosurg & saw DrStern 2/14 for neck/ [name]/ & leg pain> his eval revealed mostly right shoulder pathology & he suggested injection, f/u w/ Rheum (seen 3/14 & note reviewed, no changes), & f/u prn w/ NS...    Dementia> on Aricept5, & Namenda10Bid per DrWillis...    Neuropathy> on Gabapentin 300Tid w/ sl improvement in her symptoms...    Anxiety, Depression> prev followed by DrPlovsky- on Cymbalta20=>60, Xanax0.5prn, & off Buspar  We reviewed prob list, meds, xrays and labs> see below for updates >>  LABS 4/14:  FLP- at goals on diet rx;  Chems- wnl;  CBC- wnl;  TSH=1.33;  VitD=52;  A1c=5.7.Marland KitchenMarland Kitchen          Problem List:  SJOGREN'S SYNDROME (ICD-710.2), & DRY EYE SYNDROME (ICD-375.15) - eval by  DrHecker & DrDeveshwar 5/11 w/ extensive lab work done & Dx of Sjogren's syndrome & started on Pilocarpine 5mg Bid... DrHecker has her on a number of eye drops, & prev thought to be dry eyes w/ ocular rosacea & tubes placed in tear ducts... also seen by Mikey Kirschner...  ~  10/11:  DrDeveshwar started PLAQUENIL 200mg /d... Seen every 48mo by Rheum. ~  Most recent note from [facility] 4/13> Sjogren's, Raynaud's, LBP, musc spasms, on Plaquenil Rx & no changes made. ~  4/14: followed by DrDeveshwar on Plaquenil200, Tears/ Restasis, Nystatin, etc for her chronic oral discomfort & stable...  CHRONIC ORAL DISCOMFORT - this has been looked into by mult physicians and no other etiology discovered (other than her sicca syndrome)... she uses Magic Mouthwash Prn + sialogogues...  HYPERTENSION, BENIGN (ICD-401.1) - controlled on TOPROL XL 50mg - 1/2 tabBid, DILTIAZEM CD 180mg /d, & LASIX 20mg  Qod...   ~  5/12: BP= 134/80 today, takes meds regularly & tol well... denies CP, worsening palipit, syncope, dyspnea, edema, etc... ~  11/12: BP= 122/60 & she remains asymptomatic... ~  5/13:  BP= 122/70 & she denies CP, palpit, change in SOB, edema, etc... ~  CXR 6/13 showed stable bronchitic changes w/ thickening of airways, scarring at lung bases, no acute changes. ~  4/14: on Metop50-1/2Bid, Diltiazem180, Lisinopril20, Lasix20prn;  BP= 128/62 & she has mult symptoms and complaints w/o change  MITRAL VALVE PROLAPSE (ICD-424.0) - on ASA 81mg /d, & LANOXIN 0.125mg /d and followed by DrLittle for Cardiology... his notes are reviewed> MVP, HBP, hx palpit, neg cath... ~  cath 1/05 by DrLittle showed clean coronaries & norm LVF w/ +MVP... ~  1/11:  Seen by DrLittle for f/u MVP & palpit; BP was sl elev & he rec incr Metoprolol to 25mg  Bid.  HYPERLIPIDEMIA>  On low chol, low fat diet alone... She takes Fish Oil, flax seed oil, etc. ~  2008:  FLP showed TChol 170, TG 176, HDL 50,  ~  5/12:  FLP showed TChol 142, TG 202, HDL 62,  LDL 166 ~  FLP 6/13 showed TChol 128, TG 56, HDL 61, LDL 56 ~  4/14: on diet, FishOil & FlaxSeedOil; FLP shows TChol 166, TG 112, HDL  62, LDL 82  Elev Blood Sugar >> occurred during the 6/13 hosp & covered by SSI during the hosp & sent to rehab center w/ this==> discontinued on office follow up... ~  Labs 4/14 showed BS= 96, A1c= 5.7  MULTINODULAR THYROID >> Asymptomatic thyroid nodule ident on MRI scan of neck... ~  10/13:  Thyroid Ultrasound showed multinodular gland w/ dominant nodule measuring 4cm on left lobe... ~  11/13:  Thyroid Biopsy by IR> neg- c/w hyperplastic nodule, no malig cells seen... ~  4/14:  Labs showed TSH= 1.33  GERD (ICD-530.81) - on PREVACID 30mg /d... denies reflux symptoms, swallowing OK x for dryness, etc... ~  last EGD 9/02 showed 4cmHH, GERD...  DIVERTICULOSIS OF COLON (ICD-562.10),  IRRITABLE BOWEL SYNDROME (ICD-564.1), & Family Hx of COLON CANCER (ICD-153.9) - followed for GI by DrSamLeBauer- now DrGessner- colonoscopy 2/06 showed a tortuous and spastic colon, divertics, otherw neg... her sister died in her 19's w/ colon cancer... f/u colonoscopy 4/10 showed mod divertics, hems, melanosis> Rx'd w/ Miralax/ Senakot-S. ~  11/11:  she notes incr IBS symptoms- rec Rx w/ BENTYL 20mg Qid Prn, Mylicon, Phazyme ==> improved.  URINARY INCONTINENCE (ICD-788.30) - eval by GYN= DrRNeal (on Premarin 0.9mg /d)... ~  7/13:  Foley discontinued, voiding better w/ freq trips to the commode to prevent leaking... ~  4/14: prev on Trimethoprim for UTI prevention per Urology; DrDahlstedt added Myrbetriq50 & she is improved.  LOW [name] PAIN SYNDROME (ICD-724.2) - on VICODIN up to 3/ day... Ortho= DrMortenson, Rendall, et al... ~  5/12:  She is c/o incr pain in right side of [name]/ hip/ leg> refer to Ortho for further eval; Rx Vicodin prn... ~  MRI of Cspine 10/13 showed Cx spondylosis & mild sp stenosis- referred to Neurosurg & saw DrStern 2/14 for neck/ [name]/ & leg pain> his eval revealed  mostly right shoulder pathology & he suggested injection, f/u w/ Rheum (seen 3/14 & note reviewed, no changes), & f/u prn w/ NS  FIBROMYALGIA (ICD-729.1) - she has chr fatigue symptoms and diffuse aches & pains... she has been eval by Rheum DrDeveshwar w/ recent dx of Sjogren's Syndrome & DDD- on CYCLOBENZAPRINE 10mg - 1/2-1 tab Prn...  MEMORY LOSS (ICD-780.93) - eval by DrHickling for Neurology w/ MRI done- pt states "it's not dementia, just hardening of the arteries"... his note from 4/08 indicates- OBS, gait abn & neck/ [name] pain diagnoses... ~  5/13:  Daughter notes more difficulty & ready to start medication ==> Aricept 5mg /d was recommended but never started by the pt... ~  6/13:  The hospitalists started [facility] 5mg Bid in lieu of the Aricept ==> memory is no better per daughter ~  7/13:  c/o headaches and taking Vicodin as needed... ~  4/14: on Aricept5, & Namenda10Bid per DrWillis...   NEUROPATHY (ICD-355.9) - on GABAPENTIN 300mg Tid now w/ some relief; initially started on 300mg Bid per DrHickling, but she was intol w/ confusion & slurred speech... neuropathy symptoms improved on Rx. ~  6/13:  She was disch from hosp on larger dose of Neurontin 100mg - 3Tid (subseq changed to 300mg tabTid)  ANXIETY DEPRESSION (ICD-300.4) - on CYMBALTA 60mg /d & Alpraz0.5mg  as needed... prev Psychiatric Rx from DrCottle but she hasn't seen him in >29yr & refuses to return... Family requested med for depression & we tried Zoloft 25 to 50mg  /d but they stopped it due to affect on her gait & speech... Note> Klonopin in past caused hallucinations, but she tolerates the intermittent Xanax ok... ~  6/13:  She was seen by  DrBogard, Psychiatrist during the 6/13 hosp & will be referred for outpt follow up... ~  Daugh has arranged for f/u appt w/ DrPlovsky => finally seen and he increased the Cymbalta to 60mg /d... ~  4/14:  prev followed by DrPlovsky- on Cymbalta20=>60, Xanax0.5prn, & off Buspar...  Hx of ADVERSE DRUG  REACTION (ICD-995.20) - hx of mult drug allergies and intolerances including:  PCN, Sulfa, Doxy, NSAIDs, Demerol, Codeine, DCN100, Tramadol, Phenergan, Thorazine, Stelazine...  NOTE: she takes Vicodin for pain, and tolerates this satis...  Health Maintenance - GYN= DrNeal on Premarin, Calcium, Vits, & Fish Oil...   Past Surgical History  Procedure Laterality Date  . Vesicovaginal fistula closure w/ tah    . Appendectomy    . Cholecystectomy    . Mandible surgery    . Temporomandibular joint surgery    . Cataract extraction, bilateral    . Abdominal hysterectomy    . Dental surgery      multiple tooth extractions    Outpatient Encounter Prescriptions as of 05/16/2012  Medication Sig Dispense Refill  . ALPRAZolam (XANAX) 0.5 MG tablet Take 1 tablet (0.5 mg total) by mouth 2 (two) times daily as needed for anxiety. For anxiety  60 tablet  5  . aspirin 81 MG tablet Take 81 mg by mouth daily.        . Calcium Carbonate-Vitamin D (CALTRATE 600+D) 600-400 MG-UNIT per tablet Take 2 tablets by mouth daily.        . carboxymethylcellulose (REFRESH TEARS) 0.5 % SOLN 1 drop 2 (two) times daily.        . cycloSPORINE (RESTASIS) 0.05 % ophthalmic emulsion Place 1 drop into both eyes every 12 (twelve) hours.       . digoxin (LANOXIN) 0.125 MG tablet Take 125 mcg by mouth daily.        Marland Kitchen diltiazem (CARDIZEM CD) 180 MG 24 hr capsule TAKE ONE CAPSULE BY MOUTH EVERY DAY  30 capsule  11  . donepezil (ARICEPT) 5 MG tablet Take 5 mg by mouth at bedtime as needed.      . DULoxetine (CYMBALTA) 60 MG capsule Take 60 mg by mouth daily.      Marland Kitchen estrogens, conjugated, (PREMARIN) 0.45 MG tablet Take 0.45 mg by mouth daily. Take daily for 21 days then do not take for 7 days.      . feeding supplement (ENSURE) PUDG Take 1 Container by mouth 3 (three) times daily between meals.      . folic acid (FOLVITE) 1 MG tablet TAKE 1 TABLET TWICE A DAY  60 tablet  6  . furosemide (LASIX) 20 MG tablet TAKE 1 TABLET BY MOUTH EVERY  MORNING AS NEEDED FOR SWELLING  30 tablet  6  . gabapentin (NEURONTIN) 300 MG capsule Take 1 capsule (300 mg total) by mouth 3 (three) times daily.  90 capsule  6  . HYDROcodone-acetaminophen (NORCO/VICODIN) 5-325 MG per tablet Take 1 tablet three times a day as needed for pain * Max 3 tablets per day*  90 tablet  1  . hydrocortisone (ANUSOL-HC) 2.5 % rectal cream Apply to rectal area after each BM  30 g  11  . hydroxychloroquine (PLAQUENIL) 200 MG tablet Take 200 mg by mouth daily. As directed by Dr. Corliss Skains       . lansoprazole (PREVACID) 30 MG capsule Take 1 capsule (30 mg total) by mouth daily.  30 capsule  11  . lisinopril (PRINIVIL,ZESTRIL) 20 MG tablet Take 20 mg by mouth daily.      Marland Kitchen  loteprednol (LOTEMAX) 0.5 % ophthalmic suspension Place 1 drop into both eyes 2 (two) times daily.      . Memantine HCl ER (NAMENDA XR) 28 MG CP24 Take 1 capsule by mouth daily.      . metoprolol (LOPRESSOR) 50 MG tablet Take 25 mg by mouth 2 (two) times daily.      . mirabegron ER (MYRBETRIQ) 50 MG TB24 Take 50 mg by mouth daily.      . Multiple Vitamin (MULTIVITAMIN) tablet Take 1 tablet by mouth daily.        Marland Kitchen nystatin (MYCOSTATIN) 100000 UNIT/ML suspension USE 1 TEASPOONFUL TO GARGLE AND SWALLOW FOUR TIMES A DAY AS NEEDED  120 mL  5  . Omega-3 Fatty Acids (FISH OIL) 1000 MG CAPS Take 1 capsule by mouth daily.        . pilocarpine (SALAGEN) 5 MG tablet Take 5 mg by mouth 2 (two) times daily. As directed by Dr. Corliss Skains       . polyethylene glycol (MIRALAX / GLYCOLAX) packet Take 17 g by mouth 2 (two) times daily.       . sennosides-docusate sodium (SENOKOT-S) 8.6-50 MG tablet Take 2 tablets by mouth at bedtime.       . [DISCONTINUED] Alum & Mag Hydroxide-Simeth (MAGIC MOUTHWASH W/LIDOCAINE) SOLN Take 5 mLs by mouth 4 (four) times daily as needed.      . [DISCONTINUED] busPIRone (BUSPAR) 10 MG tablet TAKE 1 TABLET THREE TIMES A DAY  90 tablet  5  . [DISCONTINUED] cyclobenzaprine (FLEXERIL) 10 MG tablet  Take 1/2 to 1 tablet by mouth 4 times daily as needed for msucle spasm  120 tablet  5  . [DISCONTINUED] CYMBALTA 20 MG capsule TAKE 2 CAPSULES BY MOUTH DAILY  60 capsule  6  . [DISCONTINUED] estrogens, conjugated, (PREMARIN) 0.9 MG tablet Take 0.9 mg by mouth daily. Take daily for 21 days then do not take for 7 days.       . [DISCONTINUED] furosemide (LASIX) 20 MG tablet Take 20 mg by mouth every other day.      . [DISCONTINUED] hydroxypropyl methylcellulose (ISOPTO TEARS) 2.5 % ophthalmic solution Place 1 drop into both eyes 4 (four) times daily.      . [DISCONTINUED] lansoprazole (PREVACID) 30 MG capsule TAKE ONE CAPSULE BY MOUTH EVERY DAY  30 capsule  6  . [DISCONTINUED] NAMENDA 5 MG tablet TAKE 1 TABLET TWICE A DAY  60 tablet  6  . [DISCONTINUED] oxyCODONE-acetaminophen (PERCOCET/ROXICET) 5-325 MG per tablet Take 1 tablet by mouth every 4 (four) hours as needed for pain.  15 tablet  0  . [DISCONTINUED] trimethoprim (TRIMPEX) 100 MG tablet Take 100 mg by mouth at bedtime.       No facility-administered encounter medications on file as of 05/16/2012.    Allergies  Allergen Reactions  . Codeine Nausea Only    unless given with Phenergan  . Doxycycline     Unknown  . Klonopin (Clonazepam)     Causes hallucination   . Meperidine Hcl Nausea Only    unless given with Phenergan  . Naproxen   . Norflex (Orphenadrine Citrate) Nausea Only    Unless given with Phenergan  . Oxycodone-Acetaminophen Nausea Only    unless given with phenergan  . Penicillins     Unknown  . Phenothiazines     Unknown  . Propoxyphene Hcl Nausea Only    unless given with phenergan  . Stelazine     Unknown  . Sulfamethoxazole W-Trimethoprim  Unknown  . Tolectin (Tolmetin Sodium)     Unknown  . Tramadol     Unknown  . Zoloft (Sertraline Hcl)     Caused pt to sleep a lot  . Lubiprostone Rash    Current Medications, Allergies, Past Medical History, Past Surgical History, Family History, and Social  History were reviewed in [employer] record.    Review of Systems        See HPI - all other systems neg except as noted... The patient complains of decreased hearing, hoarseness, dyspnea on exertion, and abdominal pain.  The patient denies anorexia, fever, weight loss, weight gain, vision loss, chest pain, syncope, peripheral edema, prolonged cough, headaches, hemoptysis, melena, hematochezia, severe indigestion/heartburn, hematuria, incontinence, muscle weakness, suspicious skin lesions, transient blindness, difficulty walking, depression, unusual weight change, abnormal bleeding, enlarged lymph nodes, and angioedema.     Objective:   Physical Exam      WD, WN, 79 y/o WF in NAD... she has dry eyes & prominent dry mouth symptoms... GENERAL:  Alert & oriented; pleasant & cooperative...  HEENT:  [place]/AT, EOM-wnl, PERRLA, EACs-clear, TMs-wnl, NOSE-clear, THROAT-clear & excessively dry. NECK:  Supple w/ fairROM; no JVD; normal carotid impulses w/o bruits; no thyromegaly or nodules palpated; no lymphadenopathy. CHEST:  Clear to P & A; without wheezes/ rales/ or rhonchi. HEART:  Regular Rhythm; without murmurs/ rubs/ or gallops heard... ABDOMEN:  Soft & nontender; normal bowel sounds; no organomegaly or masses detected. EXT: without deformities, mild arthritic changes; no varicose veins/ venous insuffic/ or edema. NEURO:  CN's intact;  no focal neuro deficits... DERM:  No lesions noted; no rash etc...  RADIOLOGY DATA:  Reviewed in the EPIC EMR & discussed w/ the patient...  LABORATORY DATA:  Reviewed in the EPIC EMR & discussed w/ the patient...   Assessment & Plan:    SJOGREN's>  Still w/ very dry Sicca syndrome & medication from DrDeveshwar, Ophthalmology, etc...  HBP>  Controlled on meds, continue same but add regular dosing of the Lasix 20mg  Qd...  MVP>  Followed by DrLittle/Hilty for Cards, & stable on meds above...  HYPERLIPID>  TG sl elev & rec to get on  better low fat diet...  GI>  GERD, HH, Divertics, IBS, +FemHx colon ca>  Stable on Prev30, last colon 2010- OK, Bentyl helps...  UTI / Urinary Retension>  She was hosp 6/13 by [facility]- UTI resolved w/ Ab & Foley removed by Urology, now drDahlstedt on Myrbetriq50 & improved...  Chronic Pain Syndrome>  She states adeq control w/ Vicodin Rx...  Memory loss/ anxiety/ depression>  Prev eval by Neuro- DrHickling, DrCottle for psyche in past;  During the 6/13 [facility]- she was seen by DrBogard for  Psyche & placed on Buspar & Cymbalta; needs outpt f/u by Psyche=> DrPlovsky...   Patient's Medications  New Prescriptions   DIPHENHYD-HYDROCORT-NYSTATIN (FIRST-DUKES MOUTHWASH) SUSP    1 tsp garlge and swallow four times daily  Previous Medications   ALPRAZOLAM (XANAX) 0.5 MG TABLET    Take 1 tablet (0.5 mg total) by mouth 2 (two) times daily as needed for anxiety. For anxiety   ASPIRIN 81 MG TABLET    Take 81 mg by mouth daily.     CALCIUM CARBONATE-VITAMIN D (CALTRATE 600+D) 600-400 MG-UNIT PER TABLET    Take 2 tablets by mouth daily.     CARBOXYMETHYLCELLULOSE (REFRESH TEARS) 0.5 % SOLN    1 drop 2 (two) times daily.     CYCLOSPORINE (RESTASIS) 0.05 % OPHTHALMIC EMULSION  Place 1 drop into both eyes every 12 (twelve) hours.    DIGOXIN (LANOXIN) 0.125 MG TABLET    Take 125 mcg by mouth daily.     DILTIAZEM (CARDIZEM CD) 180 MG 24 HR CAPSULE    TAKE ONE CAPSULE BY MOUTH EVERY DAY   DONEPEZIL (ARICEPT) 5 MG TABLET    Take 5 mg by mouth at bedtime as needed.   DULOXETINE (CYMBALTA) 60 MG CAPSULE    Take 60 mg by mouth daily.   ESTROGENS, CONJUGATED, (PREMARIN) 0.45 MG TABLET    Take 0.45 mg by mouth daily. Take daily for 21 days then do not take for 7 days.   FEEDING SUPPLEMENT (ENSURE) PUDG    Take 1 Container by mouth 3 (three) times daily between meals.   FOLIC ACID (FOLVITE) 1 MG TABLET    TAKE 1 TABLET TWICE A DAY   GABAPENTIN (NEURONTIN) 300 MG CAPSULE    Take 1 capsule (300 mg total) by mouth 3  (three) times daily.   HYDROCODONE-ACETAMINOPHEN (NORCO/VICODIN) 5-325 MG PER TABLET    Take 1 tablet three times a day as needed for pain * Max 3 tablets per day*   HYDROCORTISONE (ANUSOL-HC) 2.5 % RECTAL CREAM    Apply to rectal area after each BM   HYDROXYCHLOROQUINE (PLAQUENIL) 200 MG TABLET    Take 200 mg by mouth daily. As directed by Dr. Corliss Skains    LANSOPRAZOLE (PREVACID) 30 MG CAPSULE    Take 1 capsule (30 mg total) by mouth daily.   LISINOPRIL (PRINIVIL,ZESTRIL) 20 MG TABLET    Take 20 mg by mouth daily.   LOTEPREDNOL (LOTEMAX) 0.5 % OPHTHALMIC SUSPENSION    Place 1 drop into both eyes 2 (two) times daily.   MEMANTINE HCL ER (NAMENDA XR) 28 MG CP24    Take 1 capsule by mouth daily.   METOPROLOL (LOPRESSOR) 50 MG TABLET    Take 25 mg by mouth 2 (two) times daily.   MIRABEGRON ER (MYRBETRIQ) 50 MG TB24    Take 50 mg by mouth daily.   MULTIPLE VITAMIN (MULTIVITAMIN) TABLET    Take 1 tablet by mouth daily.     OMEGA-3 FATTY ACIDS (FISH OIL) 1000 MG CAPS    Take 1 capsule by mouth daily.     PILOCARPINE (SALAGEN) 5 MG TABLET    Take 5 mg by mouth 2 (two) times daily. As directed by Dr. Corliss Skains    POLYETHYLENE GLYCOL [facility] / Ethelene Hal) PACKET    Take 17 g by mouth 2 (two) times daily.    SENNOSIDES-DOCUSATE SODIUM (SENOKOT-S) 8.6-50 MG TABLET    Take 2 tablets by mouth at bedtime.   Modified Medications   Modified Medication Previous Medication   FUROSEMIDE (LASIX) 20 MG TABLET furosemide (LASIX) 20 MG tablet          TAKE 1 TABLET BY MOUTH EVERY MORNING AS NEEDED FOR SWELLING  Discontinued Medications   ALUM & MAG HYDROXIDE-SIMETH (MAGIC MOUTHWASH W/LIDOCAINE) SOLN    Take 5 mLs by mouth 4 (four) times daily as needed.   BUSPIRONE (BUSPAR) 10 MG TABLET    TAKE 1 TABLET THREE TIMES A DAY   CYCLOBENZAPRINE (FLEXERIL) 10 MG TABLET    Take 1/2 to 1 tablet by mouth 4 times daily as needed for msucle spasm   CYMBALTA 20 MG CAPSULE    TAKE 2 CAPSULES BY MOUTH DAILY   ESTROGENS, CONJUGATED,  (PREMARIN) 0.9 MG TABLET    Take 0.9 mg by mouth daily. Take daily  for 21 days then do not take for 7 days.    FUROSEMIDE (LASIX) 20 MG TABLET    Take 20 mg by mouth every other day.   HYDROXYPROPYL METHYLCELLULOSE (ISOPTO TEARS) 2.5 % OPHTHALMIC SOLUTION    Place 1 drop into both eyes 4 (four) times daily.   LANSOPRAZOLE (PREVACID) 30 MG CAPSULE    TAKE ONE CAPSULE BY MOUTH EVERY DAY   NAMENDA 5 MG TABLET    TAKE 1 TABLET TWICE A DAY   NYSTATIN (MYCOSTATIN) 100000 UNIT/ML SUSPENSION    USE 1 TEASPOONFUL TO GARGLE AND SWALLOW FOUR TIMES A DAY AS NEEDED   OXYCODONE-ACETAMINOPHEN (PERCOCET/ROXICET) 5-325 MG PER TABLET    Take 1 tablet by mouth every 4 (four) hours as needed for pain.   TRIMETHOPRIM (TRIMPEX) 100 MG TABLET    Take 100 mg by mouth at bedtime.

## 2012-05-16 NOTE — Patient Instructions (Addendum)
Today we updated your med list in our EPIC system...    Continue your current medications the same...  Today we did your follow up FASTING blood work...    We will contact you w/ the results when available...     We will send copies to [facility]...  Call for any questions...  Let's plan a follow up visit in 4-46mo, sooner if needed for problems.Marland KitchenMarland Kitchen

## 2012-05-21 ENCOUNTER — Ambulatory Visit (INDEPENDENT_AMBULATORY_CARE_PROVIDER_SITE_OTHER): Payer: Medicare Other | Admitting: Neurology

## 2012-05-21 ENCOUNTER — Encounter: Payer: Self-pay | Admitting: Neurology

## 2012-05-21 VITALS — BP 109/52 | HR 60 | Ht 64.0 in | Wt 122.0 lb

## 2012-05-21 DIAGNOSIS — G589 Mononeuropathy, unspecified: Secondary | ICD-10-CM

## 2012-05-21 DIAGNOSIS — M545 Low back pain, unspecified: Secondary | ICD-10-CM

## 2012-05-21 DIAGNOSIS — F0390 Unspecified dementia without behavioral disturbance: Secondary | ICD-10-CM

## 2012-05-21 DIAGNOSIS — IMO0001 Reserved for inherently not codable concepts without codable children: Secondary | ICD-10-CM

## 2012-05-21 MED ORDER — GABAPENTIN 400 MG PO CAPS: 400.0000 mg | ORAL_CAPSULE | Freq: Three times a day (TID) | ORAL | Status: DC

## 2012-05-21 NOTE — Progress Notes (Signed)
Reason for visit: Peripheral neuropathy  Ariana [name] is an 79 y.o. female  History of present illness:  Ariana [name] is an 79 year old right-handed white female with a history of a peripheral neuropathy associated with pain in both feet. The patient also complained of some low back pain, and she reports some gait instability. The patient has fallen on several occasions since last seen. The patient reports that she currently uses a cane for ambulation. The patient has not had MRI evaluation of the low back. The patient indicates that she has pain in the feet with ambulation, again the right foot is more painful than the left. The patient reports some ongoing issues with memory, but she believes that this has been relatively stable. The patient is now on Aricept and Namenda. The patient returns to the office today for an evaluation.  Past Medical History  Diagnosis Date  . Sjogren's syndrome   . Dry eye syndrome   . Hypertension, benign   . Mitral valve prolapse   . GERD (gastroesophageal reflux disease)   . Diverticulosis of colon   . Irritable bowel syndrome   . Urinary incontinence   . Low back pain syndrome   . Fibromyalgia   . Memory loss   . Anxiety and depression   . History of adverse drug reaction   . Peripheral neuropathy     "both feet and legs"  . Shortness of breath 07/18/11    "alot lately"  . Anginal pain   . History of recurrent UTIs   . H/O hiatal hernia   . Anxiety   . Dementia   . Depression   . Abnormality of gait   . Thyroid nodule     Past Surgical History  Procedure Laterality Date  . Vesicovaginal fistula closure w/ tah    . Appendectomy    . Cholecystectomy    . Mandible surgery    . Temporomandibular joint surgery    . Cataract extraction, bilateral    . Abdominal hysterectomy    . Dental surgery      multiple tooth extractions    Family History  Problem Relation Age of Onset  . Colon cancer    . Heart disease Father   . Pneumonia  Mother   . Heart attack Mother   . Cancer Sister   . Cancer Brother     Social history:  reports that she has quit smoking. She has never used smokeless tobacco. She reports that she does not drink alcohol. Her drug history is not on file.  Allergies:  Allergies  Allergen Reactions  . Codeine Nausea Only    unless given with Phenergan  . Doxycycline     Unknown  . Klonopin (Clonazepam)     Causes hallucination   . Meperidine Hcl Nausea Only    unless given with Phenergan  . Naproxen   . Norflex (Orphenadrine Citrate) Nausea Only    Unless given with Phenergan  . Oxycodone-Acetaminophen Nausea Only    unless given with phenergan  . Penicillins     Unknown  . Phenothiazines     Unknown  . Propoxyphene Hcl Nausea Only    unless given with phenergan  . Stelazine     Unknown  . Sulfamethoxazole W-Trimethoprim     Unknown  . Tolectin (Tolmetin Sodium)     Unknown  . Tramadol     Unknown  . Zoloft (Sertraline Hcl)     Caused pt to sleep a lot  .  Lubiprostone Rash    Medications:  Current Outpatient Prescriptions on File Prior to Visit  Medication Sig Dispense Refill  . ALPRAZolam (XANAX) 0.5 MG tablet Take 1 tablet (0.5 mg total) by mouth 2 (two) times daily as needed for anxiety. For anxiety  60 tablet  5  . aspirin 81 MG tablet Take 81 mg by mouth daily.        . Calcium Carbonate-Vitamin D (CALTRATE 600+D) 600-400 MG-UNIT per tablet Take 2 tablets by mouth daily.        . carboxymethylcellulose (REFRESH TEARS) 0.5 % SOLN 1 drop 2 (two) times daily.        . cycloSPORINE (RESTASIS) 0.05 % ophthalmic emulsion Place 1 drop into both eyes every 12 (twelve) hours.       . digoxin (LANOXIN) 0.125 MG tablet Take 125 mcg by mouth daily.        Marland Kitchen diltiazem (CARDIZEM CD) 180 MG 24 hr capsule TAKE ONE CAPSULE BY MOUTH EVERY DAY  30 capsule  11  . Diphenhyd-Hydrocort-Nystatin (FIRST-DUKES MOUTHWASH) SUSP 1 tsp garlge and swallow four times daily  240 mL  6  . donepezil  (ARICEPT) 5 MG tablet Take 5 mg by mouth at bedtime as needed.      . DULoxetine (CYMBALTA) 60 MG capsule Take 60 mg by mouth daily.      Marland Kitchen estrogens, conjugated, (PREMARIN) 0.45 MG tablet Take 0.45 mg by mouth daily. Take daily for 21 days then do not take for 7 days.      . folic acid (FOLVITE) 1 MG tablet TAKE 1 TABLET TWICE A DAY  60 tablet  6  . furosemide (LASIX) 20 MG tablet       . HYDROcodone-acetaminophen (NORCO/VICODIN) 5-325 MG per tablet Take 1 tablet three times a day as needed for pain * Max 3 tablets per day*  90 tablet  1  . hydrocortisone (ANUSOL-HC) 2.5 % rectal cream Apply to rectal area after each BM  30 g  11  . hydroxychloroquine (PLAQUENIL) 200 MG tablet Take 200 mg by mouth daily. As directed by Dr. Corliss Skains       . lansoprazole (PREVACID) 30 MG capsule Take 1 capsule (30 mg total) by mouth daily.  30 capsule  11  . lisinopril (PRINIVIL,ZESTRIL) 20 MG tablet Take 20 mg by mouth daily.      Marland Kitchen loteprednol (LOTEMAX) 0.5 % ophthalmic suspension Place 1 drop into both eyes 2 (two) times daily.      . Memantine HCl ER (NAMENDA XR) 28 MG CP24 Take 1 capsule by mouth daily.      . metoprolol (LOPRESSOR) 50 MG tablet Take 25 mg by mouth 2 (two) times daily.      . mirabegron ER (MYRBETRIQ) 50 MG TB24 Take 50 mg by mouth daily.      . Multiple Vitamin (MULTIVITAMIN) tablet Take 1 tablet by mouth daily.        . Omega-3 Fatty Acids (FISH OIL) 1000 MG CAPS Take 1 capsule by mouth daily.        . polyethylene glycol (MIRALAX / GLYCOLAX) packet Take 17 g by mouth 2 (two) times daily.       . sennosides-docusate sodium (SENOKOT-S) 8.6-50 MG tablet Take 2 tablets by mouth at bedtime.       . feeding supplement (ENSURE) PUDG Take 1 Container by mouth 3 (three) times daily between meals.      . pilocarpine (SALAGEN) 5 MG tablet Take 5 mg by mouth 2 (  two) times daily. As directed by Dr. Corliss Skains        No current facility-administered medications on file prior to visit.    ROS:  Out  of a complete 14 system review of symptoms, the patient complains only of the following symptoms, and all other reviewed systems are negative.  Weight gain, fatigue Chest pain, palpitations, murmur Trouble swallowing Itching Blurred vision Incontinence of bladder Easy bruising, easy bleeding Feeling cold, flushing Joint pain, achy muscles Allergies, runny nose Memory loss, weakness  Blood pressure 109/52, pulse 60, height 5\' 4"  (1.626 m), weight 122 lb (55.339 kg).  Physical Exam  General: The patient is alert and cooperative at the time of the examination.  Skin: No significant peripheral edema is noted.   Neurologic Exam  Mental status: Mini-Mental status examination done today shows a total score 25/30. The patient is able to name 11 animals in 60 seconds.  Cranial nerves: Facial symmetry is present. Speech is normal, no aphasia or dysarthria is noted. Extraocular movements are full. Visual fields are full.  Motor: The patient has good strength in all 4 extremities.  Coordination: The patient has good finger-nose-finger and heel-to-shin bilaterally.  Gait and station: The patient has slightly wide-based, unsteady gait. The patient uses a cane for ambulation. Tandem gait is poor. Romberg is negative. No drift is seen.  Reflexes: Deep tendon reflexes are symmetric, but are depressed.   Assessment/Plan:  1. Peripheral neuropathy  2. Gait disturbance  3. Memory disturbance  The patient is reporting increased discomfort in the feet. The patient will go up on the gabapentin taking 400 mg 3 times daily. The patient will be followed for her memory issues. The patient appears to have an excellent memory for recent events. The patient is on a multitude of medications, and occasionally she will get her medications mixed up. The patient will followup in 6 months.  Marlan Palau MD 05/21/2012 7:51 PM  [facility] [address] [place],  [place] [address]  Phone [phone] Fax [phone]

## 2012-05-21 NOTE — Patient Instructions (Signed)
  We will increase the gabapentin to 400 mg three times a day. If the pain is not improved, we can get a MRI of the low back in the future.

## 2012-06-17 ENCOUNTER — Other Ambulatory Visit: Payer: Self-pay | Admitting: Pulmonary Disease

## 2012-06-24 ENCOUNTER — Telehealth: Payer: Self-pay | Admitting: Pulmonary Disease

## 2012-06-24 NOTE — Telephone Encounter (Signed)
Called spoke with patient's daughter Chrysta who reports: Prod cough with dark green mucus x1 week Low grade temp Ov w/ TP already scheduled for tomorrow 5.13.14 @ 2:15pm Masha asking for some recommendations to hold pt until ov tomorrow Advised mucinex dm 1-2 q12h prn cough/congestion, push fluids, rest, alternate tylenol/ibuprofen q4h prn fever Tieisha verbalized her understanding and denied any questions Milanya [place] that if pt's symptoms worsen prior to ov to call or seek emergency assistance Will sign and forward to SN as fyi

## 2012-06-25 ENCOUNTER — Encounter: Payer: Self-pay | Admitting: Adult Health

## 2012-06-25 ENCOUNTER — Ambulatory Visit (INDEPENDENT_AMBULATORY_CARE_PROVIDER_SITE_OTHER): Payer: Medicare Other | Admitting: Adult Health

## 2012-06-25 VITALS — BP 136/68 | HR 57 | Temp 98.3°F | Ht 63.5 in | Wt 128.2 lb

## 2012-06-25 DIAGNOSIS — J069 Acute upper respiratory infection, unspecified: Secondary | ICD-10-CM

## 2012-06-25 MED ORDER — AZITHROMYCIN 250 MG PO TABS: ORAL_TABLET | ORAL | Status: DC

## 2012-06-25 NOTE — Progress Notes (Signed)
Subjective:    Patient ID: Ariana [name], female    DOB: [birth year], 79 y.o.   MRN: [id number]  HPI 79 y/o WF    ~  Jul 14, 2011:  75mo ROV & Ariana [name] is here w/ her daughter who is her caregiver; there is much conflict w/ pt being argumentative & set in her ways, resisting help from daugh, etc; especially regarding her meds> daughter notes several meds caused reactions- staggering, hallucinations, confusion (eg- Zoloft, Klonopin); pt takes Xanax as needed (admin by family) & she is really angry about not getting more pain medicine when she wants it; we discussed these issues but she has early dementia & doesn't "get-it", daugh requested Alz med & we will start DONEPIZIL 5mg /d for 714mo the 10mg /d thereafter...  Pt spent an inordinate amt of time ranting about her teeth, dentures, implants, TMJ, her severe dry mouth, etc...    We reviewed prob list, meds, xrays and labs> see below>> LABS 5/13:  Chems- ok w/ BS=105 Na=132;  CBC- wnl;  Fe=123 (30%sat);  TSH=1.56; Urine- +UTI ==> we will Rx w/ Cipro250Bid...  ~  August 02, 2011:  3wk [place] & post hospital visit>  She was Adm 6/4 - 07/25/11 by [facility] w/ UTI & urinary retension, altered mental status, & her mult other medical problems;  Her urine was abn but no growth on cult & she improved w/ Ceftin;  She had a foley placed but this was not discontinued at disch to the rehab facility- she was sent w/ it & has had f/u w/ Urology for removal;  She had just been started on Aricept (but hadn't yet taken her 1st pill) & was switched to Namenda 5mg Bid;  She was seen by Psychiatry DrBogard and started on Buspar & Cymbalta- but they do not see outpts so we will have to refer to an office based Psychiatrist for on-going help;  She was also disch on SSI but her sugars were only minimally elev & A1c=5.7, and we will stop the insulin & monitor sugars on diet alone...      We reviewed prob list, meds, xrays and labs> see below>> CXR 6/13 showed atx & scarring LLL, NAD... CT Brain  6/13 showed cortical atrophy, NAD... CT Abd 6/13 showed distented bladder, constip, s/p hyst, left renal cyst, diverticulosis & some wall thickening in transverse colon... EKG 6/13 showed NSR, rate74, LAD, minor NSSTTWA... LABS 6/13:  CBC- Hg=11-12, WBC=6-7K;  Sed=16;  Chems- ok x elev BS (90-160), A1c=5.7;  FLP- at goals;  TFTs= wnl...  ~  August 31, 2011:  714mo ROV & Ariana [name] feels like she's a new person- improving, getting therapy, not talking to herself anymore; unfortunately she has been unable to see an outpt psychiatrist & asking social services at [facility] for help... Daughter has been helpful trying to get her the psychiatric care she needs (recall that she saw Neuro- DrReynolds (Dementia) & Psyche- DrBogard (started on Buspar & Cymbalta) during her recent hosp)...    We reviewed prob list, meds, xrays and labs> see below see below >>  ~  November 03, 2011:  54mo ROV & daugh is frustrated w/ pt (dementia, behavioral issues) & difficulty arranging for outpt psychiatric care- she had appt w/ DrPlovsky but it's been moved out twice... Pt is now at home but actually liked being at [facility] & their rehab program "I had to leave"; she & daugh seem to be at odds esp re pt's dementia; since ret home she has fallen  w/ pain in her tailbone & we discussed mentioning this to DrDeveshwar for poss XRays & to see what could possibly be done for that; they request refill of her Gabapentin 300mg Tid...  She notes several ecchymoses "THEY ARE NOT BRUISES" she emphasizes;  BP has been intermittently elev at home since disch from the NH & measures 142/70; furthermore they note intermittent swelling in ankles & we discussed compromise by adding LASIX 20mg  Qod...    She has a f/u appt coming up w/ DrHilty at [facility] (DrLittle has retired)...    She saw Urology 6/13> bladder distention w/ poor emptying; she was disch w/ foley, & had that eventually removed w/ improved voiding & no signif PVR...     We reviewed prob  list, meds, xrays and labs> see below for updates >> ADDENDUM:  ThyroidSonar 11/13> dominant left thyroid nodule biopsied ==> c/w hyperplastic nodule, no malig cells ident...  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    HBP> on Metop50-1/2Bid, Diltiazem180, Lasix20Qod, & DrHilty added Lisinopril20;  BP= 124/72 & she has mult symptoms and complaints...    MVP, CWP, Palpit> on ASA81, Lanoxin.125; followed by DrHilty now that DrLittle has retired- seen 11/13 & note reviewed...    Chol> on diet, FishOil & FlaxSeedOil; last FLP in Epic was 6/13 w/ TChol 128, TG 56, HDL 61, LDL 56    Multinod Goiter> w/ Bx of dom 4cm left lobe nodule= hyperplastic;  Thyroid function has been wnl w/ TSH 6/13= 1.16 (normal FreeT3 & FreeT4).    GI- GERD, Divertics, IBS, +FamHx colon ca> on Prev30, Bentyl20, Miralax/Senakot-S, AnusolHC; last colonoscopy 2010 by DrGessner w/ divertics, hems, melanosis...    Urinary incont> on Trimethoprim for UTI prevention per Urology...    FM, LBP, fall w/ fx coccyx (ER 10/13)> MRI of Cspine 10/13 showedCx spondylosis & mild sp stenosis- referred to Neurosurg & eval pending...    Dementia> on Namenda10Bid per DrWillis...    Neuropathy> on Gabapentin 300Tid w/ sl improvement in her symptoms...    Anxiety, Depression> followed by DrPlovsky- on Cymbalta60, Buspar10Tid, Xanax0.5prn... We reviewed prob list, meds, xrays and labs> see below for updates >>   ~  May 16, 2012:  14mo ROV & Ariana [name] says "I ain't doin" & she notes incr swelling=> rec to incr Lasix20 from prn to one daily;  She has persistent mult somatic complaints & daugh wonders about her  depression, on low dose Cymbalta, & we decided to incr to 60mg /d... We reviewed the following medical problems during today's office visit >>   06/25/2012 Acute OV  Complains of prod cough with dark green mucus, low grade temp x1 week. Denies wheezing, chest tightness, dyspnea OTC cold meds not working  No hemoptysis , chest pain, edema or orthopnea.  Continues with dry mouth.    PMH:     Sjogren's syndrome> followed by DrDeveshwar on Plaquenil200, + tears/ Restasis, Nystatin, etc for her chronic oral discomfort...    HBP> on Metop50-1/2Bid, Diltiazem180, Lisinopril20, Lasix20prn;  BP= 128/62 & she has mult symptoms and complaints w/o change...    MVP, CWP, Palpit> on ASA81, Lanoxin.125; followed by Balinda Quails now that DrLittle  has retired- seen 11/13 & note reviewed...    Chol> on diet, FishOil & FlaxSeedOil; FLP shows TChol 166, TG 112, HDL 62, LDL 82    Multinod Goiter> w/ Bx of dom 4cm left lobe nodule= hyperplastic;  Thyroid function has been wnl w/ TSH = 1.33 (normal FreeT3 & FreeT4).    GI- GERD, Divertics, IBS, +FamHx colon ca> on Prev30, Miralax/Senakot-S, AnusolHC; last colonoscopy 2010 by DrGessner w/ divertics, hems, melanosis...    Urinary incont> prev on Trimethoprim for UTI prevention per Urology; DrDahlstedt added Myrbetriq50 & she is improved...    FM, LBP, fall w/ fx coccyx (ER 10/13)> MRI of Cspine 10/13 showed Cx spondylosis & mild sp stenosis- referred to Neurosurg & saw DrStern 2/14 for neck/ back/ & leg pain> his eval revealed mostly right shoulder pathology & he suggested injection, f/u w/ Rheum (seen 3/14 & note reviewed, no changes), & f/u prn w/ NS...    Dementia> on Aricept5, & Namenda10Bid per DrWillis...    Neuropathy> on Gabapentin 300Tid w/ sl improvement in her symptoms...    Anxiety, Depression> prev followed by DrPlovsky- on Cymbalta20=>60, Xanax0.5prn, & off Buspar  We reviewed prob list, meds, xrays and labs> see below for updates >>  LABS 4/14:  FLP- at goals  on diet rx;  Chems- wnl;  CBC- wnl;  TSH=1.33;  VitD=52;  A1c=5.7.Marland KitchenMarland Kitchen          Problem List:  SJOGREN'S SYNDROME (ICD-710.2), & DRY EYE SYNDROME (ICD-375.15) - eval by DrHecker & DrDeveshwar 5/11 w/ extensive lab work done & Dx of Sjogren's syndrome & started on Pilocarpine 5mg Bid... DrHecker has her on a number of eye drops, & prev thought to be dry eyes w/ ocular rosacea & tubes placed in tear ducts... also seen by Mikey Kirschner...  ~  10/11:  DrDeveshwar started PLAQUENIL 200mg /d... Seen every 69mo by Rheum. ~  Most recent note from [facility] 4/13> Sjogren's, Raynaud's, LBP, musc spasms, on Plaquenil Rx & no changes made. ~  4/14: followed by DrDeveshwar on Plaquenil200, Tears/ Restasis, Nystatin, etc for her chronic oral discomfort & stable...  CHRONIC ORAL DISCOMFORT - this has been looked into by mult physicians and no other etiology discovered (other than her sicca syndrome)... she uses Magic Mouthwash Prn + sialogogues...  HYPERTENSION, BENIGN (ICD-401.1) - controlled on TOPROL XL 50mg - 1/2 tabBid, DILTIAZEM CD 180mg /d, & LASIX 20mg  Qod...   ~  5/12: BP= 134/80 today, takes meds regularly & tol well... denies CP, worsening palipit, syncope, dyspnea, edema, etc... ~  11/12: BP= 122/60 & she remains asymptomatic... ~  5/13:  BP= 122/70 & she denies CP, palpit, change in SOB, edema, etc... ~  CXR 6/13 showed stable bronchitic changes w/ thickening of airways, scarring at lung bases, no acute changes. ~  4/14: on Metop50-1/2Bid, Diltiazem180, Lisinopril20, Lasix20prn;  BP= 128/62 & she has mult symptoms and complaints w/o change  MITRAL VALVE PROLAPSE (ICD-424.0) - on ASA 81mg /d, & LANOXIN 0.125mg /d and followed by DrLittle for Cardiology... his notes are reviewed> MVP, HBP, hx palpit, neg cath... ~  cath 1/05 by DrLittle showed clean coronaries & norm LVF w/ +MVP... ~  1/11:  Seen by DrLittle for f/u MVP & palpit; BP was sl elev & he rec incr Metoprolol to 25mg  Bid.  HYPERLIPIDEMIA>  On  low chol, low fat diet alone... She takes Fish Oil, flax seed oil, etc. ~  2008:  FLP showed TChol 170, TG 176, HDL 50,  ~  5/12:  FLP showed TChol 142, TG  202, HDL 62, LDL 166 ~  FLP 6/13 showed TChol 128, TG 56, HDL 61, LDL 56 ~  4/14: on diet, FishOil & FlaxSeedOil; FLP shows TChol 166, TG 112, HDL 62, LDL 82  Elev Blood Sugar >> occurred during the 6/13 hosp & covered by SSI during the hosp & sent to rehab center w/ this==> discontinued on office follow up... ~  Labs 4/14 showed BS= 96, A1c= 5.7  MULTINODULAR THYROID >> Asymptomatic thyroid nodule ident on MRI scan of neck... ~  10/13:  Thyroid Ultrasound showed multinodular gland w/ dominant nodule measuring 4cm on left lobe... ~  11/13:  Thyroid Biopsy by IR> neg- c/w hyperplastic nodule, no malig cells seen... ~  4/14:  Labs showed TSH= 1.33  GERD (ICD-530.81) - on PREVACID 30mg /d... denies reflux symptoms, swallowing OK x for dryness, etc... ~  last EGD 9/02 showed 4cmHH, GERD...  DIVERTICULOSIS OF COLON (ICD-562.10),  IRRITABLE BOWEL SYNDROME (ICD-564.1), & Family Hx of COLON CANCER (ICD-153.9) - followed for GI by DrSamLeBauer- now DrGessner- colonoscopy 2/06 showed a tortuous and spastic colon, divertics, otherw neg... her sister died in her 63's w/ colon cancer... f/u colonoscopy 4/10 showed mod divertics, hems, melanosis> Rx'd w/ Miralax/ Senakot-S. ~  11/11:  she notes incr IBS symptoms- rec Rx w/ BENTYL 20mg Qid Prn, Mylicon, Phazyme ==> improved.  URINARY INCONTINENCE (ICD-788.30) - eval by GYN= DrRNeal (on Premarin 0.9mg /d)... ~  7/13:  Foley discontinued, voiding better w/ freq trips to the commode to prevent leaking... ~  4/14: prev on Trimethoprim for UTI prevention per Urology; DrDahlstedt added Myrbetriq50 & she is improved.  LOW BACK PAIN SYNDROME (ICD-724.2) - on VICODIN up to 3/ day... Ortho= DrMortenson, Rendall, et al... ~  5/12:  She is c/o incr pain in right side of back/ hip/ leg> refer to Ortho for further eval;  Rx Vicodin prn... ~  MRI of Cspine 10/13 showed Cx spondylosis & mild sp stenosis- referred to Neurosurg & saw DrStern 2/14 for neck/ back/ & leg pain> his eval revealed mostly right shoulder pathology & he suggested injection, f/u w/ Rheum (seen 3/14 & note reviewed, no changes), & f/u prn w/ NS  FIBROMYALGIA (ICD-729.1) - she has chr fatigue symptoms and diffuse aches & pains... she has been eval by Rheum DrDeveshwar w/ recent dx of Sjogren's Syndrome & DDD- on CYCLOBENZAPRINE 10mg - 1/2-1 tab Prn...  MEMORY LOSS (ICD-780.93) - eval by DrHickling for Neurology w/ MRI done- pt states "it's not dementia, just hardening of the arteries"... his note from 4/08 indicates- OBS, gait abn & neck/ Back pain diagnoses... ~  5/13:  Daughter notes more difficulty & ready to start medication ==> Aricept 5mg /d was recommended but never started by the pt... ~  6/13:  The hospitalists started [facility] 5mg Bid in lieu of the Aricept ==> memory is no better per daughter ~  7/13:  c/o headaches and taking Vicodin as needed... ~  4/14: on Aricept5, & Namenda10Bid per DrWillis...   NEUROPATHY (ICD-355.9) - on GABAPENTIN 300mg Tid now w/ some relief; initially started on 300mg Bid per DrHickling, but she was intol w/ confusion & slurred speech... neuropathy symptoms improved on Rx. ~  6/13:  She was disch from hosp on larger dose of Neurontin 100mg - 3Tid (subseq changed to 300mg tabTid)  ANXIETY DEPRESSION (ICD-300.4) - on CYMBALTA 60mg /d & Alpraz0.5mg  as needed... prev Psychiatric Rx from DrCottle but she hasn't seen him in >70yr & refuses to return... Family requested med for depression & we tried Zoloft 25 to 50mg  /d  but they stopped it due to affect on her gait & speech... Note> Klonopin in past caused hallucinations, but she tolerates the intermittent Xanax ok... ~  6/13:  She was seen by DrBogard, Psychiatrist during the 6/13 hosp & will be referred for outpt follow up... ~  Daugh has arranged for f/u appt w/ DrPlovsky =>  finally seen and he increased the Cymbalta to 60mg /d... ~  4/14:  prev followed by DrPlovsky- on Cymbalta20=>60, Xanax0.5prn, & off Buspar...  Hx of ADVERSE DRUG REACTION (ICD-995.20) - hx of mult drug allergies and intolerances including:  PCN, Sulfa, Doxy, NSAIDs, Demerol, Codeine, DCN100, Tramadol, Phenergan, Thorazine, Stelazine...  NOTE: she takes Vicodin for pain, and tolerates this satis...  Health Maintenance - GYN= DrNeal on Premarin, Calcium, Vits, & Fish Oil...   Past Surgical History  Procedure Laterality Date  . Vesicovaginal fistula closure w/ tah    . Appendectomy    . Cholecystectomy    . Mandible surgery    . Temporomandibular joint surgery    . Cataract extraction, bilateral    . Abdominal hysterectomy    . Dental surgery      multiple tooth extractions    Outpatient Encounter Prescriptions as of 06/25/2012  Medication Sig Dispense Refill  . ALPRAZolam (XANAX) 0.5 MG tablet Take 1 tablet (0.5 mg total) by mouth 2 (two) times daily as needed for anxiety. For anxiety  60 tablet  5  . aspirin 81 MG tablet Take 81 mg by mouth daily.        . Calcium Carbonate-Vitamin D (CALTRATE 600+D) 600-400 MG-UNIT per tablet Take 2 tablets by mouth daily.        . carboxymethylcellulose (REFRESH TEARS) 0.5 % SOLN 1 drop 2 (two) times daily.        . cholecalciferol (VITAMIN D) 1000 UNITS tablet Take 1,000 Units by mouth daily.      . digoxin (LANOXIN) 0.125 MG tablet Take 125 mcg by mouth daily.        Marland Kitchen diltiazem (CARDIZEM CD) 180 MG 24 hr capsule TAKE ONE CAPSULE BY MOUTH EVERY DAY  30 capsule  11  . donepezil (ARICEPT) 5 MG tablet Take 5 mg by mouth at bedtime as needed.      . DULoxetine (CYMBALTA) 60 MG capsule Take 60 mg by mouth daily.      Marland Kitchen estrogens, conjugated, (PREMARIN) 0.45 MG tablet Take 0.45 mg by mouth daily. Take daily for 21 days then do not take for 7 days.      . Flaxseed, Linseed, (FLAXSEED OIL) 1000 MG CAPS Take 1 capsule by mouth 2 (two) times daily.      .  folic acid (FOLVITE) 1 MG tablet TAKE 1 TABLET TWICE A DAY  60 tablet  6  . furosemide (LASIX) 20 MG tablet Take 20 mg by mouth daily as needed.       . gabapentin (NEURONTIN) 400 MG capsule Take 1 capsule (400 mg total) by mouth 3 (three) times daily.  90 capsule  5  . HYDROcodone-acetaminophen (NORCO/VICODIN) 5-325 MG per tablet Take 1 tablet three times a day as needed for pain * Max 3 tablets per day*  90 tablet  1  . hydrocortisone (ANUSOL-HC) 2.5 % rectal cream Apply to rectal area after each BM  30 g  11  . hydroxychloroquine (PLAQUENIL) 200 MG tablet Take 200 mg by mouth daily. As directed by Dr. Corliss Skains       . lactose free nutrition (BOOST) LIQD 1-2 cans per  day      . lansoprazole (PREVACID) 30 MG capsule TAKE ONE CAPSULE BY MOUTH EVERY DAY  30 capsule  6  . lisinopril (PRINIVIL,ZESTRIL) 20 MG tablet Take 20 mg by mouth daily.      . Memantine HCl ER (NAMENDA XR) 28 MG CP24 Take 1 capsule by mouth daily.      . metoprolol (LOPRESSOR) 50 MG tablet Take 25 mg by mouth 2 (two) times daily.      . mirabegron ER (MYRBETRIQ) 50 MG TB24 Take 50 mg by mouth daily.      . Multiple Vitamin (MULTIVITAMIN) tablet Take 1 tablet by mouth daily.        Marland Kitchen nystatin (MYCOSTATIN) 100000 UNIT/ML suspension 1 teaspoonful to gargle and swallow 4 times daily as needed      . Omega-3 Fatty Acids (FISH OIL) 1000 MG CAPS Take 1 capsule by mouth daily.        . pilocarpine (SALAGEN) 5 MG tablet Take 5 mg by mouth 2 (two) times daily. As directed by Dr. Corliss Skains       . polyethylene glycol (MIRALAX / GLYCOLAX) packet Take 17 g by mouth 2 (two) times daily.       . cycloSPORINE (RESTASIS) 0.05 % ophthalmic emulsion Place 1 drop into both eyes every 12 (twelve) hours.       Marland Kitchen loteprednol (LOTEMAX) 0.5 % ophthalmic suspension Place 1 drop into both eyes 2 (two) times daily.      . sennosides-docusate sodium (SENOKOT-S) 8.6-50 MG tablet Take 2 tablets by mouth at bedtime.       . [DISCONTINUED]  Diphenhyd-Hydrocort-Nystatin (FIRST-DUKES MOUTHWASH) SUSP 1 tsp garlge and swallow four times daily  240 mL  6  . [DISCONTINUED] feeding supplement (ENSURE) PUDG Take 1 Container by mouth 3 (three) times daily between meals.      . [DISCONTINUED] lansoprazole (PREVACID) 30 MG capsule Take 1 capsule (30 mg total) by mouth daily.  30 capsule  11   No facility-administered encounter medications on file as of 06/25/2012.    Allergies  Allergen Reactions  . Codeine Nausea Only    unless given with Phenergan  . Doxycycline     Unknown  . Klonopin (Clonazepam)     Causes hallucination   . Meperidine Hcl Nausea Only    unless given with Phenergan  . Naproxen   . Norflex (Orphenadrine Citrate) Nausea Only    Unless given with Phenergan  . Oxycodone-Acetaminophen Nausea Only    unless given with phenergan  . Penicillins     Unknown  . Phenothiazines     Unknown  . Propoxyphene Hcl Nausea Only    unless given with phenergan  . Stelazine     Unknown  . Sulfamethoxazole W-Trimethoprim     Unknown  . Tolectin (Tolmetin Sodium)     Unknown  . Tramadol     Unknown  . Zoloft (Sertraline Hcl)     Caused pt to sleep a lot  . Lubiprostone Rash    Current Medications, Allergies, Past Medical History, Past Surgical History, Family History, and Social History were reviewed in [employer] record.    Review of Systems reviewed and neg except HPI             Objective:   Physical Exam      WD, WN, 79 y/o WF in NAD... she has dry eyes & prominent dry mouth symptoms... GENERAL:  Alert & oriented; pleasant & cooperative...  HEENT:  [place]/AT,  EACs-clear, TMs-wnl, NOSE-clear, THROAT-clear & excessively dry. NECK:  Supple w/ fairROM; no JVD; normal carotid impulses w/o bruits; no thyromegaly or nodules palpated; no lymphadenopathy. CHEST:  Clear to P & A; without wheezes/ rales/ or rhonchi. HEART:  Regular Rhythm; without murmurs/ rubs/ or gallops heard... ABDOMEN:  Soft  & nontender; normal bowel sounds; no organomegaly or masses detected. EXT: without deformities, mild arthritic changes; no varicose veins/ venous insuffic/ or edema. NEURO:    no focal neuro deficits noted  DERM:  No lesions noted; no rash etc..Marland Kitchen

## 2012-06-25 NOTE — Patient Instructions (Addendum)
ZPack take as directed.  Mucinex twice daily as needed cough/congestion  Fluids and rest  Follow up as planned with Dr. Kriste Basque and as needed

## 2012-06-27 DIAGNOSIS — J069 Acute upper respiratory infection, unspecified: Secondary | ICD-10-CM | POA: Insufficient documentation

## 2012-06-27 NOTE — Assessment & Plan Note (Signed)
ZPack take as directed.  Mucinex twice daily as needed cough/congestion  Fluids and rest  Follow up as planned with Dr. Nadel and as needed  

## 2012-07-04 ENCOUNTER — Other Ambulatory Visit: Payer: Self-pay | Admitting: Pulmonary Disease

## 2012-07-20 ENCOUNTER — Other Ambulatory Visit: Payer: Self-pay | Admitting: Pulmonary Disease

## 2012-07-22 ENCOUNTER — Telehealth: Payer: Self-pay | Admitting: Pulmonary Disease

## 2012-07-22 NOTE — Telephone Encounter (Signed)
Spoke with the pt's daughter She states pt having HA and increased back and neck pain the past couple of days She states that the pain is a "numb pain"  She thinks that this is related to neuropathy I advised to call for appt with Dr Anne Hahn  She is taking vicodin and neurontin for the pain She wants recs per SN before calling neuro  Please advise thanks! Allergies  Allergen Reactions  . Codeine Nausea Only    unless given with Phenergan  . Doxycycline     Unknown  . Klonopin (Clonazepam)     Causes hallucination   . Meperidine Hcl Nausea Only    unless given with Phenergan  . Naproxen   . Norflex (Orphenadrine Citrate) Nausea Only    Unless given with Phenergan  . Oxycodone-Acetaminophen Nausea Only    unless given with phenergan  . Penicillins     Unknown  . Phenothiazines     Unknown  . Propoxyphene Hcl Nausea Only    unless given with phenergan  . Stelazine     Unknown  . Sulfamethoxazole W-Trimethoprim     Unknown  . Tolectin (Tolmetin Sodium)     Unknown  . Tramadol     Unknown  . Zoloft (Sertraline Hcl)     Caused pt to sleep a lot  . Lubiprostone Rash

## 2012-07-23 NOTE — Telephone Encounter (Signed)
Called spoke with patient's daughter Apologized for the inconvenience Pt's pain is unchanged Dr Kriste Basque please advise, thank you.

## 2012-07-23 NOTE — Telephone Encounter (Signed)
Patient's daughter is calling back.  She states she has not heard anything from SN.  Patient still in pain.  [phone]

## 2012-07-23 NOTE — Telephone Encounter (Signed)
Per SN---  She see's   Dr. Venetia Maxon for LBP and stenosis Dr. Corliss Skains for Rhematology Dr. Anne Hahn for LBP   SN cannot give her more meds for PAIN.  She will need to see her specialists for this.  Will need to call and set up appt with Dr. Anne Hahn for further eval of pain.

## 2012-07-23 NOTE — Telephone Encounter (Signed)
Pt's daughter is aware of SN recommendations. She states that they weren't looking for pain meds. They just want to know who they should see regarding this issue. Nothing further was needed.

## 2012-07-24 ENCOUNTER — Telehealth: Payer: Self-pay | Admitting: Pulmonary Disease

## 2012-07-24 MED ORDER — HYDROCODONE-ACETAMINOPHEN 5-325 MG PO TABS: ORAL_TABLET | ORAL | Status: DC

## 2012-07-24 MED ORDER — FUROSEMIDE 20 MG PO TABS: 20.0000 mg | ORAL_TABLET | Freq: Every day | ORAL | Status: DC | PRN

## 2012-07-24 NOTE — Telephone Encounter (Signed)
Spoke with pharmacy rx  called in

## 2012-08-13 ENCOUNTER — Telehealth: Payer: Self-pay | Admitting: Pulmonary Disease

## 2012-08-13 DIAGNOSIS — M25519 Pain in unspecified shoulder: Secondary | ICD-10-CM

## 2012-08-13 DIAGNOSIS — M542 Cervicalgia: Secondary | ICD-10-CM

## 2012-08-13 NOTE — Telephone Encounter (Signed)
(  continued)  Pt's daughter is asking to speak w/ Leigh only as she is very well versed in pt's medical Hx.  Pt is very agitated & starting to express this due to all of the pain she is currently in.  Please Help!  Antionette Fairy

## 2012-08-13 NOTE — Telephone Encounter (Signed)
Per SN---ok to try and get pt an appt with Dr. Venetia Maxon to eval pt for her neck and shoulder pain.  Will place the order and see if we can get her an appt.

## 2012-08-13 NOTE — Telephone Encounter (Signed)
Called and spoke with pts daughter and she stated that the pt did not want any pain meds from the last phone note.   She stated that  They have called Dr. Wayne Sever left several messages but has not heard back from their office in 3 weeks.  i advised her to call and speak with the office manager again or the director for that office.  She would like for [place] to see if we can get her an appt to be seen.    She stated that the pt has also been having lots of nasal congestion--clear some of the time but the pt has been getting out yellow mucus.  Wanting to know what she should be doing for this.    SN please advise. Thanks.    Allergies  Allergen Reactions  . Codeine Nausea Only    unless given with Phenergan  . Doxycycline     Unknown  . Klonopin (Clonazepam)     Causes hallucination   . Meperidine Hcl Nausea Only    unless given with Phenergan  . Naproxen   . Norflex (Orphenadrine Citrate) Nausea Only    Unless given with Phenergan  . Oxycodone-Acetaminophen Nausea Only    unless given with phenergan  . Penicillins     Unknown  . Phenothiazines     Unknown  . Propoxyphene Hcl Nausea Only    unless given with phenergan  . Stelazine     Unknown  . Sulfamethoxazole W-Trimethoprim     Unknown  . Tolectin (Tolmetin Sodium)     Unknown  . Tramadol     Unknown  . Zoloft (Sertraline Hcl)     Caused pt to sleep a lot  . Lubiprostone Rash

## 2012-08-14 NOTE — Telephone Encounter (Signed)
Daughter is aware of recs. Order was not placed. I have done so. Nothing further was needed.

## 2012-08-19 ENCOUNTER — Telehealth: Payer: Self-pay | Admitting: Neurology

## 2012-08-19 ENCOUNTER — Telehealth: Payer: Self-pay | Admitting: Pulmonary Disease

## 2012-08-19 DIAGNOSIS — R51 Headache: Secondary | ICD-10-CM

## 2012-08-19 NOTE — Telephone Encounter (Signed)
I tried to call the daughter, and I left a message. The patient is having new onset headaches, falls. A workup will need to be done soon. We will try to revisit. The patient will need a head scan and a sedimentation rate.

## 2012-08-19 NOTE — Addendum Note (Signed)
Addended by: Stephanie Acre on: 08/19/2012 06:42 PM   Modules accepted: Orders

## 2012-08-19 NOTE — Telephone Encounter (Signed)
I spoke to daughter Brooklynn, and she is also trying to find out what the patients issues are.  The patient is complaining of headaches, and leg and feet pain, the falls have been in the past.    I told the daughter we are not seeing her for headaches, but peripheral neuropathy.  The headaches have been going on for about 3 weeks.  The daughter would like to speak to the doctor about what is going on.  She can be reached at [phone]

## 2012-08-19 NOTE — Telephone Encounter (Signed)
(  continued) her headaches & when contacted by Gladys Damme, advised pt to contact Dr. Anne Hahn' office for an appointment.  Dr. Rush Farmer is happy to see pt if there is possible neck surgery, but not for headaches.  Please advise.  Antionette Fairy

## 2012-08-19 NOTE — Telephone Encounter (Signed)
I

## 2012-08-19 NOTE — Telephone Encounter (Signed)
I called the patient. She indicates that she has had headaches for about 3 or 4 weeks. The patient denies any recent falls. The patient indicates that her symptoms in her legs are worsening. I'll try to get an MRI of the brain, and a revisit.

## 2012-08-19 NOTE — Telephone Encounter (Signed)
Called and spoke with pts daughter and she stated that the pt was having pain in her neck and going down into her arms and back.  She stated that the pt has been staying in the bed recently.  Will get up sometimes.  pts daughter will call and speak with her mother to see where we need to go with the referral to Dr. Venetia Maxon.  pts daughter to call back.

## 2012-08-20 ENCOUNTER — Emergency Department (HOSPITAL_COMMUNITY): Payer: Medicare Other

## 2012-08-20 ENCOUNTER — Inpatient Hospital Stay (HOSPITAL_COMMUNITY)
Admission: EM | Admit: 2012-08-20 | Discharge: 2012-08-23 | DRG: 392 | Disposition: A | Discharge status: Home Health | Payer: Medicare Other | Attending: Internal Medicine | Admitting: Internal Medicine

## 2012-08-20 ENCOUNTER — Encounter (HOSPITAL_COMMUNITY): Payer: Self-pay | Admitting: *Deleted

## 2012-08-20 ENCOUNTER — Telehealth: Payer: Self-pay | Admitting: Pulmonary Disease

## 2012-08-20 DIAGNOSIS — F411 Generalized anxiety disorder: Secondary | ICD-10-CM

## 2012-08-20 DIAGNOSIS — R630 Anorexia: Secondary | ICD-10-CM | POA: Diagnosis present

## 2012-08-20 DIAGNOSIS — R339 Retention of urine, unspecified: Secondary | ICD-10-CM

## 2012-08-20 DIAGNOSIS — K5730 Diverticulosis of large intestine without perforation or abscess without bleeding: Secondary | ICD-10-CM

## 2012-08-20 DIAGNOSIS — M542 Cervicalgia: Secondary | ICD-10-CM

## 2012-08-20 DIAGNOSIS — H04129 Dry eye syndrome of unspecified lacrimal gland: Secondary | ICD-10-CM

## 2012-08-20 DIAGNOSIS — G8929 Other chronic pain: Secondary | ICD-10-CM | POA: Diagnosis present

## 2012-08-20 DIAGNOSIS — R413 Other amnesia: Secondary | ICD-10-CM

## 2012-08-20 DIAGNOSIS — Z87891 Personal history of nicotine dependence: Secondary | ICD-10-CM

## 2012-08-20 DIAGNOSIS — T887XXA Unspecified adverse effect of drug or medicament, initial encounter: Secondary | ICD-10-CM

## 2012-08-20 DIAGNOSIS — F0390 Unspecified dementia without behavioral disturbance: Secondary | ICD-10-CM | POA: Diagnosis present

## 2012-08-20 DIAGNOSIS — N390 Urinary tract infection, site not specified: Secondary | ICD-10-CM

## 2012-08-20 DIAGNOSIS — R112 Nausea with vomiting, unspecified: Secondary | ICD-10-CM | POA: Diagnosis present

## 2012-08-20 DIAGNOSIS — Z86010 Personal history of colonic polyps: Secondary | ICD-10-CM

## 2012-08-20 DIAGNOSIS — I059 Rheumatic mitral valve disease, unspecified: Secondary | ICD-10-CM

## 2012-08-20 DIAGNOSIS — K219 Gastro-esophageal reflux disease without esophagitis: Secondary | ICD-10-CM | POA: Diagnosis present

## 2012-08-20 DIAGNOSIS — G609 Hereditary and idiopathic neuropathy, unspecified: Secondary | ICD-10-CM | POA: Diagnosis present

## 2012-08-20 DIAGNOSIS — K589 Irritable bowel syndrome without diarrhea: Secondary | ICD-10-CM | POA: Diagnosis present

## 2012-08-20 DIAGNOSIS — F341 Dysthymic disorder: Secondary | ICD-10-CM

## 2012-08-20 DIAGNOSIS — K2970 Gastritis, unspecified, without bleeding: Principal | ICD-10-CM

## 2012-08-20 DIAGNOSIS — K2990 Gastroduodenitis, unspecified, without bleeding: Principal | ICD-10-CM | POA: Diagnosis present

## 2012-08-20 DIAGNOSIS — E7800 Pure hypercholesterolemia, unspecified: Secondary | ICD-10-CM

## 2012-08-20 DIAGNOSIS — E871 Hypo-osmolality and hyponatremia: Secondary | ICD-10-CM

## 2012-08-20 DIAGNOSIS — I10 Essential (primary) hypertension: Secondary | ICD-10-CM | POA: Diagnosis present

## 2012-08-20 DIAGNOSIS — M545 Low back pain, unspecified: Secondary | ICD-10-CM

## 2012-08-20 DIAGNOSIS — I498 Other specified cardiac arrhythmias: Secondary | ICD-10-CM | POA: Diagnosis present

## 2012-08-20 DIAGNOSIS — J069 Acute upper respiratory infection, unspecified: Secondary | ICD-10-CM

## 2012-08-20 DIAGNOSIS — R51 Headache: Secondary | ICD-10-CM | POA: Diagnosis present

## 2012-08-20 DIAGNOSIS — G589 Mononeuropathy, unspecified: Secondary | ICD-10-CM

## 2012-08-20 DIAGNOSIS — M3500 Sicca syndrome, unspecified: Secondary | ICD-10-CM | POA: Diagnosis present

## 2012-08-20 DIAGNOSIS — E042 Nontoxic multinodular goiter: Secondary | ICD-10-CM

## 2012-08-20 DIAGNOSIS — R32 Unspecified urinary incontinence: Secondary | ICD-10-CM

## 2012-08-20 DIAGNOSIS — R079 Chest pain, unspecified: Secondary | ICD-10-CM

## 2012-08-20 DIAGNOSIS — E876 Hypokalemia: Secondary | ICD-10-CM | POA: Diagnosis present

## 2012-08-20 DIAGNOSIS — M940 Chondrocostal junction syndrome [Tietze]: Secondary | ICD-10-CM | POA: Diagnosis present

## 2012-08-20 DIAGNOSIS — K224 Dyskinesia of esophagus: Secondary | ICD-10-CM | POA: Diagnosis present

## 2012-08-20 DIAGNOSIS — K222 Esophageal obstruction: Secondary | ICD-10-CM

## 2012-08-20 DIAGNOSIS — IMO0001 Reserved for inherently not codable concepts without codable children: Secondary | ICD-10-CM | POA: Diagnosis present

## 2012-08-20 DIAGNOSIS — R1310 Dysphagia, unspecified: Secondary | ICD-10-CM | POA: Diagnosis present

## 2012-08-20 LAB — POCT I-STAT TROPONIN I: Troponin i, poc: 0.01 ng/mL (ref 0.00–0.08)

## 2012-08-20 LAB — CBC
MCH: 30.4 pg (ref 26.0–34.0)
MCV: 91.3 fL (ref 78.0–100.0)
Platelets: 211 10*3/uL (ref 150–400)
RDW: 12.4 % (ref 11.5–15.5)

## 2012-08-20 MED ORDER — GI COCKTAIL ~~LOC~~
30.0000 mL | Freq: Once | ORAL | Status: AC
  Administered 2012-08-20: 30 mL via ORAL
  Filled 2012-08-20: qty 30

## 2012-08-20 NOTE — Telephone Encounter (Signed)
Daughter will call back once she finds out the results of the MRI and she will decide after this if she wants the appt with Dr. Venetia Maxon.  Will sign off of this note for now.

## 2012-08-20 NOTE — ED Notes (Signed)
Patient transported to X-ray 

## 2012-08-20 NOTE — Telephone Encounter (Signed)
See other phone note from 08-20-2012

## 2012-08-20 NOTE — ED Notes (Signed)
Pt states started having mid sternal chest pain yesterday that has been constant, states hurts when you press on mid chest area, denies radiation of pain, states is having shortness of breath, placed on 2L [place] d/t shortness of breath, states has had nausea w/ dry heaving also.

## 2012-08-20 NOTE — ED Notes (Signed)
Pt reports mid sternal cp which started yesterday, became worse today.  Pt also reports SOB.  Pt reports hx of palpitations.

## 2012-08-21 ENCOUNTER — Encounter (HOSPITAL_COMMUNITY): Payer: Self-pay | Admitting: Internal Medicine

## 2012-08-21 ENCOUNTER — Observation Stay (HOSPITAL_COMMUNITY): Payer: Medicare Other

## 2012-08-21 DIAGNOSIS — E876 Hypokalemia: Secondary | ICD-10-CM

## 2012-08-21 DIAGNOSIS — R079 Chest pain, unspecified: Secondary | ICD-10-CM

## 2012-08-21 DIAGNOSIS — R1310 Dysphagia, unspecified: Secondary | ICD-10-CM

## 2012-08-21 LAB — BASIC METABOLIC PANEL
Calcium: 9.5 mg/dL (ref 8.4–10.5)
Creatinine, Ser: 0.56 mg/dL (ref 0.50–1.10)
GFR calc Af Amer: >90 mL/min (ref >90)
GFR calc non Af Amer: 85 mL/min — ABNORMAL LOW (ref >90)

## 2012-08-21 LAB — CBC WITH DIFFERENTIAL/PLATELET
Lymphocytes Relative: 36 % (ref 12–46)
Lymphs Abs: 2.5 10*3/uL (ref 0.7–4.0)
MCV: 91.5 fL (ref 78.0–100.0)
Neutro Abs: 3.4 10*3/uL (ref 1.7–7.7)
Neutrophils Relative %: 48 % (ref 43–77)
Platelets: 187 10*3/uL (ref 150–400)
RBC: 4.13 MIL/uL (ref 3.87–5.11)
WBC: 7.1 10*3/uL (ref 4.0–10.5)

## 2012-08-21 LAB — TROPONIN I
Troponin I: <0.3 ng/mL (ref <0.30)
Troponin I: <0.3 ng/mL (ref <0.30)

## 2012-08-21 LAB — URINALYSIS, ROUTINE W REFLEX MICROSCOPIC
Bilirubin Urine: NEGATIVE
Hgb urine dipstick: NEGATIVE
Protein, ur: NEGATIVE mg/dL
Urobilinogen, UA: 1 mg/dL (ref 0.0–1.0)

## 2012-08-21 LAB — LIPASE, BLOOD: Lipase: 31 U/L (ref 11–59)

## 2012-08-21 LAB — COMPREHENSIVE METABOLIC PANEL
Albumin: 3.2 g/dL — ABNORMAL LOW (ref 3.5–5.2)
BUN: 6 mg/dL (ref 6–23)
Chloride: 100 mEq/L (ref 96–112)
Creatinine, Ser: 0.54 mg/dL (ref 0.50–1.10)
GFR calc Af Amer: >90 mL/min (ref >90)
GFR calc non Af Amer: 86 mL/min — ABNORMAL LOW (ref >90)
Total Bilirubin: 0.3 mg/dL (ref 0.3–1.2)

## 2012-08-21 LAB — MAGNESIUM: Magnesium: 1.8 mg/dL (ref 1.5–2.5)

## 2012-08-21 LAB — T4, FREE: Free T4: 1.04 ng/dL (ref 0.80–1.80)

## 2012-08-21 LAB — TSH: TSH: 1.105 u[IU]/mL (ref 0.350–4.500)

## 2012-08-21 LAB — DIGOXIN LEVEL: Digoxin Level: 0.6 ng/mL — ABNORMAL LOW (ref 0.8–2.0)

## 2012-08-21 MED ORDER — DULOXETINE HCL 60 MG PO CPEP
60.0000 mg | ORAL_CAPSULE | Freq: Every day | ORAL | Status: DC
  Administered 2012-08-21 – 2012-08-23 (×3): 60 mg via ORAL
  Filled 2012-08-21 (×3): qty 1

## 2012-08-21 MED ORDER — CALCIUM CARBONATE-VITAMIN D 500-200 MG-UNIT PO TABS
2.0000 | ORAL_TABLET | Freq: Every day | ORAL | Status: DC
  Administered 2012-08-21 – 2012-08-23 (×3): 2 via ORAL
  Filled 2012-08-21 (×3): qty 2

## 2012-08-21 MED ORDER — ACETAMINOPHEN 650 MG RE SUPP: 650.0000 mg | Freq: Four times a day (QID) | RECTAL | Status: DC | PRN

## 2012-08-21 MED ORDER — OMEGA-3-ACID ETHYL ESTERS 1 G PO CAPS
1.0000 | ORAL_CAPSULE | Freq: Every day | ORAL | Status: DC
  Administered 2012-08-21 – 2012-08-23 (×3): 1 g via ORAL
  Filled 2012-08-21 (×3): qty 1

## 2012-08-21 MED ORDER — ASPIRIN 325 MG PO TABS
325.0000 mg | ORAL_TABLET | Freq: Every day | ORAL | Status: DC
  Administered 2012-08-21 – 2012-08-22 (×2): 325 mg via ORAL
  Filled 2012-08-21 (×2): qty 1

## 2012-08-21 MED ORDER — POLYETHYLENE GLYCOL 3350 17 G PO PACK
17.0000 g | PACK | Freq: Every day | ORAL | Status: DC
  Administered 2012-08-21 – 2012-08-23 (×3): 17 g via ORAL
  Filled 2012-08-21 (×3): qty 1

## 2012-08-21 MED ORDER — SUCRALFATE 1 GM/10ML PO SUSP
1.0000 g | Freq: Three times a day (TID) | ORAL | Status: DC
  Administered 2012-08-21 – 2012-08-23 (×9): 1 g via ORAL
  Filled 2012-08-21 (×12): qty 10

## 2012-08-21 MED ORDER — GABAPENTIN 400 MG PO CAPS
400.0000 mg | ORAL_CAPSULE | Freq: Three times a day (TID) | ORAL | Status: DC
  Administered 2012-08-21 – 2012-08-23 (×8): 400 mg via ORAL
  Filled 2012-08-21 (×9): qty 1

## 2012-08-21 MED ORDER — DONEPEZIL HCL 5 MG PO TABS
5.0000 mg | ORAL_TABLET | Freq: Every day | ORAL | Status: DC
  Administered 2012-08-21 – 2012-08-22 (×2): 5 mg via ORAL
  Filled 2012-08-21 (×3): qty 1

## 2012-08-21 MED ORDER — LISINOPRIL 20 MG PO TABS
20.0000 mg | ORAL_TABLET | Freq: Every day | ORAL | Status: DC
  Administered 2012-08-21 – 2012-08-23 (×3): 20 mg via ORAL
  Filled 2012-08-21 (×3): qty 1

## 2012-08-21 MED ORDER — PANTOPRAZOLE SODIUM 40 MG IV SOLR
40.0000 mg | INTRAVENOUS | Status: DC
  Administered 2012-08-21 – 2012-08-23 (×3): 40 mg via INTRAVENOUS
  Filled 2012-08-21 (×5): qty 40

## 2012-08-21 MED ORDER — MEMANTINE HCL ER 28 MG PO CP24
1.0000 | ORAL_CAPSULE | Freq: Every day | ORAL | Status: DC
  Administered 2012-08-22 – 2012-08-23 (×2): 28 mg via ORAL

## 2012-08-21 MED ORDER — POTASSIUM CHLORIDE IN NACL 40-0.9 MEQ/L-% IV SOLN
INTRAVENOUS | Status: AC
  Administered 2012-08-21: 06:00:00 via INTRAVENOUS
  Filled 2012-08-21 (×2): qty 1000

## 2012-08-21 MED ORDER — NITROGLYCERIN 0.4 MG SL SUBL: 0.4000 mg | SUBLINGUAL_TABLET | SUBLINGUAL | Status: DC | PRN

## 2012-08-21 MED ORDER — PILOCARPINE HCL 5 MG PO TABS
5.0000 mg | ORAL_TABLET | Freq: Two times a day (BID) | ORAL | Status: DC
  Administered 2012-08-21 – 2012-08-23 (×5): 5 mg via ORAL
  Filled 2012-08-21 (×6): qty 1

## 2012-08-21 MED ORDER — DILTIAZEM HCL ER COATED BEADS 180 MG PO CP24
180.0000 mg | ORAL_CAPSULE | Freq: Every day | ORAL | Status: DC
  Administered 2012-08-21 – 2012-08-22 (×2): 180 mg via ORAL
  Filled 2012-08-21 (×2): qty 1

## 2012-08-21 MED ORDER — FOLIC ACID 1 MG PO TABS
1.0000 mg | ORAL_TABLET | Freq: Every day | ORAL | Status: DC
  Administered 2012-08-21 – 2012-08-23 (×3): 1 mg via ORAL
  Filled 2012-08-21 (×3): qty 1

## 2012-08-21 MED ORDER — HYDROCODONE-ACETAMINOPHEN 5-325 MG PO TABS
1.0000 | ORAL_TABLET | ORAL | Status: DC | PRN
  Administered 2012-08-21 – 2012-08-23 (×8): 1 via ORAL
  Filled 2012-08-21 (×9): qty 1

## 2012-08-21 MED ORDER — METOPROLOL TARTRATE 25 MG PO TABS
25.0000 mg | ORAL_TABLET | Freq: Two times a day (BID) | ORAL | Status: DC
  Administered 2012-08-21 – 2012-08-23 (×5): 25 mg via ORAL
  Filled 2012-08-21 (×6): qty 1

## 2012-08-21 MED ORDER — ALPRAZOLAM 0.5 MG PO TABS
0.5000 mg | ORAL_TABLET | Freq: Two times a day (BID) | ORAL | Status: DC | PRN
  Administered 2012-08-21: 0.5 mg via ORAL
  Filled 2012-08-21: qty 1

## 2012-08-21 MED ORDER — DIGOXIN 125 MCG PO TABS
125.0000 ug | ORAL_TABLET | Freq: Every day | ORAL | Status: DC
  Administered 2012-08-21: 125 ug via ORAL
  Filled 2012-08-21 (×2): qty 1

## 2012-08-21 MED ORDER — ONDANSETRON HCL 4 MG/2ML IJ SOLN
4.0000 mg | Freq: Four times a day (QID) | INTRAMUSCULAR | Status: DC | PRN
  Administered 2012-08-21 (×2): 4 mg via INTRAVENOUS
  Filled 2012-08-21 (×2): qty 2

## 2012-08-21 MED ORDER — ACETAMINOPHEN 325 MG PO TABS: 650.0000 mg | ORAL_TABLET | Freq: Four times a day (QID) | ORAL | Status: DC | PRN

## 2012-08-21 MED ORDER — POTASSIUM CHLORIDE 20 MEQ PO PACK: 40.0000 meq | PACK | Freq: Once | ORAL | Status: DC

## 2012-08-21 MED ORDER — CYCLOSPORINE 0.05 % OP EMUL
1.0000 [drp] | Freq: Two times a day (BID) | OPHTHALMIC | Status: DC
  Administered 2012-08-21 – 2012-08-22 (×2): 1 [drp] via OPHTHALMIC
  Filled 2012-08-21 (×6): qty 1

## 2012-08-21 MED ORDER — POTASSIUM CHLORIDE 20 MEQ/15ML (10%) PO LIQD
40.0000 meq | Freq: Once | ORAL | Status: AC
  Administered 2012-08-21: 40 meq via ORAL
  Filled 2012-08-21: qty 30

## 2012-08-21 MED ORDER — CARBOXYMETHYLCELLULOSE SODIUM 0.5 % OP SOLN: 1.0000 [drp] | Freq: Two times a day (BID) | OPHTHALMIC | Status: DC

## 2012-08-21 MED ORDER — ADULT MULTIVITAMIN W/MINERALS CH
1.0000 | ORAL_TABLET | Freq: Every day | ORAL | Status: DC
  Administered 2012-08-21 – 2012-08-23 (×3): 1 via ORAL
  Filled 2012-08-21 (×3): qty 1

## 2012-08-21 MED ORDER — ONDANSETRON HCL 4 MG PO TABS: 4.0000 mg | ORAL_TABLET | Freq: Four times a day (QID) | ORAL | Status: DC | PRN

## 2012-08-21 MED ORDER — POTASSIUM CHLORIDE 10 MEQ/100ML IV SOLN
10.0000 meq | INTRAVENOUS | Status: AC
  Administered 2012-08-21 (×4): 10 meq via INTRAVENOUS
  Filled 2012-08-21 (×4): qty 100

## 2012-08-21 MED ORDER — HYDROCORTISONE 2.5 % RE CREA: TOPICAL_CREAM | RECTAL | Status: DC | PRN

## 2012-08-21 MED ORDER — MIRABEGRON ER 50 MG PO TB24
50.0000 mg | ORAL_TABLET | Freq: Every day | ORAL | Status: DC
  Administered 2012-08-21 – 2012-08-23 (×3): 50 mg via ORAL
  Filled 2012-08-21 (×3): qty 1

## 2012-08-21 MED ORDER — POLYVINYL ALCOHOL 1.4 % OP SOLN
1.0000 [drp] | Freq: Two times a day (BID) | OPHTHALMIC | Status: DC
  Administered 2012-08-21 – 2012-08-23 (×5): 1 [drp] via OPHTHALMIC
  Filled 2012-08-21: qty 15

## 2012-08-21 MED ORDER — HYDROXYCHLOROQUINE SULFATE 200 MG PO TABS
200.0000 mg | ORAL_TABLET | Freq: Every day | ORAL | Status: DC
  Administered 2012-08-21 – 2012-08-23 (×3): 200 mg via ORAL
  Filled 2012-08-21 (×3): qty 1

## 2012-08-21 MED ORDER — SODIUM CHLORIDE 0.9 % IJ SOLN
3.0000 mL | Freq: Two times a day (BID) | INTRAMUSCULAR | Status: DC
  Administered 2012-08-21 – 2012-08-22 (×2): 3 mL via INTRAVENOUS

## 2012-08-21 MED ORDER — SENNOSIDES-DOCUSATE SODIUM 8.6-50 MG PO TABS
2.0000 | ORAL_TABLET | Freq: Every day | ORAL | Status: DC
  Administered 2012-08-21 – 2012-08-22 (×2): 2 via ORAL
  Filled 2012-08-21 (×3): qty 2

## 2012-08-21 NOTE — Progress Notes (Addendum)
[facility] PROGRESS NOTE  Ariana [name] EXB:[id number] DOB: [birth year] DOA: 08/20/2012 PCP: Ariana Mcalpine, MD Primary Cardiologist: Dr. Zoila [name] Primary GI: Dr. Stan [name]  Brief narrative 79 year old female patient with extensive PMH-Sjogren syndrome, HTN, chronic pain/chest wall pain, palpitations, peripheral neuropathy, HL, esophageal dilatation, normal nuclear stress test 05/09/2001 and coronary angiogram 02/17/2003 showed no significant coronary artery disease, normal LV systolic function and mitral valve prolapse, was admitted to the [name] on [facility]12 with complaints of lower substernal/epigastric pain, nausea and vomiting and palpitations. In the ED, chest x-ray, EKG and cardiac enzymes unremarkable.   Assessment/Plan: 1. Atypical chest pain/epigastric pain: DD-GI etiology-GERD/PUD/gastritis/stricture versus musculoskeletal-costochondritis. Less likely cardiac in etiology. Thus far workup negative for MI. [facility] consulted. Continue PPI and added Carafate. GI requesting barium swallow. 2. Nausea, vomiting and? Dysphagia: [name] consulted. Barium swallow results as below-seems abnormal-await GI recommendations. Digoxin level low. 3. Hypokalemia: Replace and follow BMP. 4. HTN: Mildly uncontrolled. Continue home medications.? Indication for digoxin. 5. Sjogren syndrome: Continue Plaquenil. 6. Chronic chest pain and palpitations: Continue home medications. 7. Mild dementia.  Code Status: Full Family Communication: Discussed with Ms. Ariana [name], daughter. Disposition Plan: Home when medically stable.   Consultants:  [name]  Procedures:  None  Antibiotics:  None   HPI/Subjective: Indicates lower mid substernal/epigastric pain, ongoing intermittently for the last 4-5 days, made worse on eating or drinking with associated nausea and vomiting but no blood or coffee grounds. Denies diarrhea. No dyspnea. Intermittent palpitations. No radiation  of pain.  Objective: Filed Vitals:   08/21/12 0405 08/21/12 0451 08/21/12 0500 08/21/12 0947  BP: 172/49 163/63  155/61  Pulse: 52 51  55  Temp: 97.8 F (36.6 C) 98 F (36.7 C)    TempSrc: Oral Oral    Resp: 18 16    Height:  5' 3.5" (1.613 m)    Weight:  54.4 kg (119 lb 14.9 oz) 54.4 kg (119 lb 14.9 oz)   SpO2: 96% 98%      Intake/Output Summary (Last 24 hours) at 08/21/12 1417 Last data filed at 08/21/12 0959  Gross per 24 hour  Intake  527.5 ml  Output   1150 ml  Net -622.5 ml   Filed Weights   08/21/12 0451 08/21/12 0500  Weight: 54.4 kg (119 lb 14.9 oz) 54.4 kg (119 lb 14.9 oz)    Exam:   General exam: Comfortable. Chronically ill-looking female.  Respiratory system: Clear. No increased work of breathing.  Cardiovascular system: S1 & S2 heard, RRR. No JVD, murmurs, gallops, clicks or pedal edema. Telemetry shows sinus bradycardia (mostly in the 50s) with first degree AV block.  Gastrointestinal system: Abdomen is nondistended, soft and reproducible epigastric/xiphisternum tenderness but no rigidity, guarding or rebound. Normal bowel sounds heard.  Central nervous system: Alert and oriented. No focal neurological deficits.  Extremities: Symmetric 5 x 5 power.   Data Reviewed: Basic Metabolic Panel:  Recent Labs Lab 08/20/12 2310 08/21/12 0526 08/21/12 1035  NA 139 142  --   K 2.6* 3.1*  --   CL 95* 100  --   CO2 32 34*  --   GLUCOSE 134* 114*  --   BUN 8 6  --   CREATININE 0.56 0.54  --   CALCIUM 9.5 9.0  --   MG  --   --  1.8   Liver Function Tests:  Recent Labs Lab 08/21/12 0526  AST 19  ALT 14  ALKPHOS 64  BILITOT 0.3  PROT 6.6  ALBUMIN 3.2*    Recent Labs Lab 08/21/12 0526  LIPASE 31   No results found for this basename: AMMONIA,  in the last 168 hours CBC:  Recent Labs Lab 08/20/12 2310 08/21/12 0526  WBC 8.7 7.1  NEUTROABS  --  3.4  HGB 13.3 12.6  HCT 39.9 37.8  MCV 91.3 91.5  PLT 211 187   Cardiac  Enzymes:  Recent Labs Lab 08/21/12 0526 08/21/12 1035  TROPONINI <0.30 <0.30   BNP (last 3 results) No results found for this basename: PROBNP,  in the last 8760 hours CBG: No results found for this basename: GLUCAP,  in the last 168 hours  No results found for this or any previous visit (from the past 240 hour(s)).   Studies: Dg Chest 2 View  08/20/2012   *RADIOLOGY REPORT*  Clinical Data: Chest pain  CHEST - 2 VIEW  Comparison: 11/21/2011  Findings: Emphysematous changes and scattered fibrosis in the lungs.  Peribronchial thickening suggesting chronic bronchitis. Normal heart size and pulmonary vascularity.  No focal airspace consolidation in the lungs.  No blunting of costophrenic angles. No pneumothorax.  Degenerative changes in the spine.  No significant change since previous study.  IMPRESSION: Emphysematous and chronic bronchitic changes in the lungs.  No evidence of active pulmonary disease.   Original Report Authenticated By: Burman Nieves, M.D.   Dg Esophagus  08/21/2012   *RADIOLOGY REPORT*  Clinical Data:Difficulty swallowing.  Rule out stricture.  ESOPHAGUS/BARIUM SWALLOW/TABLET STUDY  Fluoroscopy Time: 1 minute and 47 seconds.  Comparison: None.  Findings: No laryngeal penetration or aspiration.  Slightly blunted primary esophageal stripping wave.  Mild smooth narrowing distal esophagus.  When the patient ingested a 13 mm barium tablet, this did not clear from the distal esophagus (above the distal smooth narrowing) until the patient swallowed multiple mouthfuls of water and barium.  Minimal reflux occurred.  No discrete esophageal mucosal abnormality noted.  IMPRESSION: Slightly blunted primary esophageal stripping wave.  Mild smooth narrowing distal esophagus.  When the patient ingested a 13 mm barium tablet, this did not clear from the distal esophagus (above the distal smooth narrowing) until the patient swallowed multiple mouthfuls of water and barium.  Minimal gastroesophageal  reflux.  No discrete esophageal mucosal abnormality noted.   Original Report Authenticated By: Lacy Duverney, M.D.     Additional labs:   Scheduled Meds: . aspirin  325 mg Oral Daily  . calcium-vitamin D  2 tablet Oral Daily  . cycloSPORINE  1 drop Both Eyes Q12H  . digoxin  125 mcg Oral Daily  . diltiazem  180 mg Oral Daily  . donepezil  5 mg Oral QHS  . DULoxetine  60 mg Oral Daily  . folic acid  1 mg Oral Daily  . gabapentin  400 mg Oral TID  . hydroxychloroquine  200 mg Oral Daily  . lisinopril  20 mg Oral Daily  . Memantine HCl ER  1 capsule Oral Daily  . metoprolol  25 mg Oral BID  . mirabegron ER  50 mg Oral Daily  . multivitamin with minerals  1 tablet Oral Daily  . omega-3 acid ethyl esters  1 capsule Oral Daily  . pantoprazole (PROTONIX) IV  40 mg Intravenous Q24H  . pilocarpine  5 mg Oral BID  . polyethylene glycol  17 g Oral Daily  . polyvinyl alcohol  1 drop Both Eyes BID  . senna-docusate  2 tablet Oral QHS  . sodium chloride  3 mL Intravenous  Q12H  . sucralfate  1 g Oral TID WC & HS   Continuous Infusions: . 0.9 % NaCl with KCl 40 mEq / L 50 mL/hr (08/21/12 1055)    Principal Problem:   Chest pain Active Problems:   HYPERTENSION, BENIGN   SJOGREN'S SYNDROME   Dysphagia   Hypokalemia    Time spent: 45 minutes.    Berger [name]  [facility] Pager [phone].   If 8PM-8AM, please contact night-coverage at [email or web address], password [facility] 08/21/2012, 2:17 PM  LOS: 1 day

## 2012-08-21 NOTE — Care Management Note (Addendum)
    Page 1 of 2   08/23/2012     2:51:15 PM   CARE MANAGEMENT NOTE 08/23/2012  Patient:  ABELLA, SHUGART   Account Number:  [id number]  Date Initiated:  08/21/2012  Documentation initiated by:  Lanier Clam  Subjective/Objective Assessment:   ADMITTED W/CHEST PAIN.HX:HTN,GERD.     Action/Plan:   FROM HOME.HAS PCP,PHARMACY.   Anticipated DC Date:  08/23/2012   Anticipated DC Plan:  HOME W HOME HEALTH SERVICES      DC Planning Services  CM consult      Choice offered to / List presented to:  C-4 Adult Children        HH arranged  HH-1 RN  HH-2 PT  HH-3 OT  HH-4 NURSE'S AIDE  HH-6 SOCIAL WORKER      HH agency  [facility]   Status of service:  Completed, signed off Medicare Important Message given?   (If response is "NO", the following Medicare IM given date fields will be blank) Date Medicare IM given:   Date Additional Medicare IM given:    Discharge Disposition:  HOME W HOME HEALTH SERVICES  Per UR Regulation:  Reviewed for med. necessity/level of care/duration of stay  If discussed at Long Length of Stay Meetings, dates discussed:    Comments:  08/23/12 Jaileen Janelle RN,BSN NCM [phone] SPOKE TO DTR Kanika IN LENGTHY DISCUSSION ABOUT D/C PLANS-DISCUSSED HOME HEALTH(INTERMITTENT)CARE VS PRIVATE DUTY CARE AGENCIES(OWN INDEPENDENT DETERMINATION).SHE ALREADY HAS VISITING [facility] M-F 9A-5P, SHE WILL CONTACT THEM TO ASSIST OVER THE WEEKEND. HER HUSBAND & HERSELF PROVIDE ADDITIONAL 24HR SUPPORT.[facility] CHOSEN FOR HH.KRISTEN REP AWARE OF D/C ORDERS.MD UPDATED.  08/22/12 Marcoantonio Legault RN,BSN NCM [phone] GI-EGD IN AM.PT-HH/SNF.PROVIDED W/HHC AGENCY LIST.  08/21/12 Kana Reimann RN,BSN NCM [phone] GI-ESOPHAGRAM TO R/O STRICTURE.

## 2012-08-21 NOTE — Evaluation (Signed)
Physical Therapy Evaluation Patient Details Name: [facility]ana [name] MRN: [id number] DOB: 1930/01/31 Today's Date: 08/21/2012 Time: 4098-1191 PT Time Calculation (min): 29 min  PT Assessment / Plan / Recommendation History of Present Illness  79 year old female patient with extensive PMH-Sjogren syndrome, HTN, chronic pain/chest wall pain, palpitations, peripheral neuropathy, HL, esophageal dilatation, normal nuclear stress test 05/09/2001 and coronary angiogram 02/17/2003 showed no significant coronary artery disease, normal LV systolic function and mitral valve prolapse, was admitted to the [facility] on 08/21/12 with complaints of lower substernal/epigastric pain, nausea and vomiting and palpitations.  Clinical Impression  On eval, pt required Min assist for mobility-able to ambulate ~25 fee with RW (in room. Pt refused to go into hallway). Demonstrates general weakness, decreased activity tolerance and impaired balance at times. Discussed d/c plan and possible need for SNF-pt states she prefers to d/c home. Pt will need to be at least supervision level assist with mobility in order to safely return home.     PT Assessment  Patient needs continued PT services    Follow Up Recommendations  Home health PT Supervision/Assistance - 24 hour vs SNF(depending on progress)    Does the patient have the potential to tolerate intense rehabilitation      Barriers to Discharge        Equipment Recommendations  None recommended by PT    Recommendations for Other Services OT consult   Frequency Min 3X/week    Precautions / Restrictions Precautions Precautions: Fall Restrictions Weight Bearing Restrictions: No   Pertinent Vitals/Pain 7/10 mid chest/abdomen area      Mobility  Bed Mobility Bed Mobility: Supine to Sit;Sit to Supine Supine to Sit: 4: Min assist Sit to Supine: 4: Min assist Details for Bed Mobility Assistance: assist to stabilize trunk and for LEs Transfers Transfers:  Sit to Stand;Stand to Sit Sit to Stand: 4: Min assist;From bed;From toilet Stand to Sit: 4: Min assist;To bed;To toilet Details for Transfer Assistance: Assist to rise, stabilize, control descent. VCS safety, hand placement Ambulation/Gait Ambulation/Gait Assistance: 4: Min assist Ambulation Distance (Feet): 25 Feet Assistive device: Rolling walker Ambulation/Gait Assistance Details: assist to maneuver with RW and stabilize during ambulation. VCs safety. At one point, pt was incontinent of bowel/bladder, became anxious and unsafe and left walker behind.  Gait Pattern: Step-through pattern;Decreased stride length;Trunk flexed    Exercises     PT Diagnosis: Difficulty walking;Generalized weakness  PT Problem List: Decreased strength;Decreased activity tolerance;Decreased mobility;Decreased balance;Decreased knowledge of use of DME PT Treatment Interventions: DME instruction;Gait training;Functional mobility training;Therapeutic activities;Therapeutic exercise;Balance training;Patient/family education     PT Goals(Current goals can be found in the care plan section) Acute Rehab PT Goals Patient Stated Goal: feel better.home PT Goal Formulation: With patient Time For Goal Achievement: 09/04/12 Potential to Achieve Goals: Good  Visit Information  Last PT Received On: 08/21/12 Assistance Needed: +1 History of Present Illness: 79 year old female patient with extensive PMH-Sjogren syndrome, HTN, chronic pain/chest wall pain, palpitations, peripheral neuropathy, HL, esophageal dilatation, normal nuclear stress test 05/09/2001 and coronary angiogram 02/17/2003 showed no significant coronary artery disease, normal LV systolic function and mitral valve prolapse, was admitted to the [facility] on 08/21/12 with complaints of lower substernal/epigastric pain, nausea and vomiting and palpitations.       Prior Functioning  Home Living Family/patient expects to be discharged to:: Private  residence Living Arrangements: Alone;Other (Comment) (aide 9-5 M-F) Available Help at Discharge: Personal care attendant Type of Home: House Home Access: Stairs to enter [employer] of  Steps: 1 step up Entrance Stairs-Rails: None Home Layout: One level Home Equipment: Cane - quad;Wheelchair - manual;Bedside commode;Grab bars - tub/shower Prior Function Level of Independence: Needs assistance ADL's / Homemaking Assistance Needed: meals, light housekeeping, grocery shopping Communication Communication: No difficulties    Cognition  Cognition Arousal/Alertness: Awake/alert Behavior During Therapy: WFL for tasks assessed/performed Overall Cognitive Status: Within Functional Limits for tasks assessed    Extremity/Trunk Assessment Upper Extremity Assessment Upper Extremity Assessment: Defer to OT evaluation Lower Extremity Assessment Lower Extremity Assessment: Generalized weakness Cervical / Trunk Assessment Cervical / Trunk Assessment: Kyphotic   Balance    End of Session PT - End of Session Equipment Utilized During Treatment: Gait belt Activity Tolerance: Patient limited by fatigue Patient left: in bed;with call bell/phone within reach;with bed alarm set  GP Functional Assessment Tool Used: clinical judgement Functional Limitation: Mobility: Walking and moving around Mobility: Walking and Moving Around Current Status ([id number]): At least 20 percent but less than 40 percent impaired, limited or restricted Mobility: Walking and Moving Around Goal Status [phone]): 0 percent impaired, limited or restricted   Rebeca Alert, MPT Pager: [phone]

## 2012-08-21 NOTE — ED Provider Notes (Addendum)
History    CSN: [id number] Arrival date & time 08/20/12  2250  First MD Initiated Contact with Patient 08/20/12 2300     Chief Complaint  Patient presents with  . Chest Pain   (Consider location/radiation/quality/duration/timing/severity/associated sxs/prior Treatment) Patient is a 79 y.o. female presenting with chest pain. The history is provided by the patient.  Chest Pain Pain location:  Substernal area Pain quality: sharp, shooting and stabbing   Pain radiates to:  Does not radiate Pain radiates to the back: no   Pain severity:  Moderate Onset quality:  Gradual Duration:  4 days Timing:  Constant Progression:  Worsening Chronicity:  Recurrent Context: eating   Context comment:  Patient states she has not been able to eat because it hurts when she swallows Relieved by:  Nothing Exacerbated by: Eating. Associated symptoms: abdominal pain, anxiety, back pain and shortness of breath   Associated symptoms: no cough, no diaphoresis, no fever, no headache, no nausea, not vomiting and no weakness   Associated symptoms comment:  Palpitations for the last 2 days.  No cough or fever. Risk factors: no smoking   Risk factors comment:  Multiple med problems and hx of esophagus being stretched  Past Medical History  Diagnosis Date  . Sjogren's syndrome   . Dry eye syndrome   . Hypertension, benign   . Mitral valve prolapse   . GERD (gastroesophageal reflux disease)   . Diverticulosis of colon   . Irritable bowel syndrome   . Urinary incontinence   . Low back pain syndrome   . Fibromyalgia   . Memory loss   . Anxiety and depression   . History of adverse drug reaction   . Peripheral neuropathy     "both feet and legs"  . Shortness of breath 07/18/11    "alot lately"  . Anginal pain   . History of recurrent UTIs   . H/O hiatal hernia   . Anxiety   . Dementia   . Depression   . Abnormality of gait   . Thyroid nodule    Past Surgical History  Procedure Laterality Date   . Vesicovaginal fistula closure w/ tah    . Appendectomy    . Cholecystectomy    . Mandible surgery    . Temporomandibular joint surgery    . Cataract extraction, bilateral    . Abdominal hysterectomy    . Dental surgery      multiple tooth extractions   Family History  Problem Relation Age of Onset  . Colon cancer    . Heart disease Father   . Pneumonia Mother   . Heart attack Mother   . Cancer Sister   . Cancer Brother    History  Substance Use Topics  . Smoking status: Former Games developer  . Smokeless tobacco: Never Used  . Alcohol Use: No   OB History   Grav Para Term Preterm Abortions TAB SAB Ect Mult Living                 Review of Systems  Constitutional: Negative for fever and diaphoresis.  Respiratory: Positive for shortness of breath. Negative for cough.   Cardiovascular: Positive for chest pain.  Gastrointestinal: Positive for abdominal pain. Negative for nausea and vomiting.  Musculoskeletal: Positive for back pain.  Neurological: Negative for weakness and headaches.  All other systems reviewed and are negative.    Allergies  Codeine; Doxycycline; Klonopin; Meperidine hcl; Naproxen; Norflex; Oxycodone-acetaminophen; Penicillins; Phenothiazines; Propoxyphene hcl; Stelazine; Sulfamethoxazole w-trimethoprim; Tolectin;  Tramadol; Zoloft; and Lubiprostone  Home Medications   Current Outpatient Rx  Name  Route  Sig  Dispense  Refill  . ALPRAZolam (XANAX) 0.5 MG tablet   Oral   Take 1 tablet (0.5 mg total) by mouth 2 (two) times daily as needed for anxiety. For anxiety   60 tablet   5   . aspirin EC 81 MG tablet   Oral   Take 81 mg by mouth daily.         . Calcium Carbonate-Vitamin D (CALTRATE 600+D) 600-400 MG-UNIT per tablet   Oral   Take 2 tablets by mouth daily.           . carboxymethylcellulose (REFRESH TEARS) 0.5 % SOLN      1 drop 2 (two) times daily.           . cholecalciferol (VITAMIN D) 1000 UNITS tablet   Oral   Take 1,000 Units  by mouth daily.         Marland Kitchen CRANBERRY PO   Oral   Take 2 capsules by mouth daily.         . cycloSPORINE (RESTASIS) 0.05 % ophthalmic emulsion   Both Eyes   Place 1 drop into both eyes every 12 (twelve) hours.          . digoxin (LANOXIN) 0.125 MG tablet   Oral   Take 125 mcg by mouth daily.           Marland Kitchen diltiazem (CARDIZEM CD) 180 MG 24 hr capsule      TAKE ONE CAPSULE BY MOUTH EVERY DAY   30 capsule   11   . donepezil (ARICEPT) 5 MG tablet   Oral   Take 5 mg by mouth at bedtime as needed.         . DULoxetine (CYMBALTA) 60 MG capsule   Oral   Take 60 mg by mouth daily.         Marland Kitchen estrogens, conjugated, (PREMARIN) 0.45 MG tablet   Oral   Take 0.45 mg by mouth daily. Take daily for 21 days then do not take for 7 days.         . Flaxseed, Linseed, (FLAXSEED OIL) 1000 MG CAPS   Oral   Take 1 capsule by mouth 2 (two) times daily.         . folic acid (FOLVITE) 1 MG tablet      TAKE 1 TABLET TWICE A DAY   60 tablet   6   . furosemide (LASIX) 20 MG tablet      TAKE 1 TABLET BY MOUTH EVERY MORNING AS NEEDED FOR SWELLING   30 tablet   6   . gabapentin (NEURONTIN) 400 MG capsule   Oral   Take 1 capsule (400 mg total) by mouth 3 (three) times daily.   90 capsule   5   . HYDROcodone-acetaminophen (NORCO/VICODIN) 5-325 MG per tablet      Take 1 tablet three times a day as needed for pain * Max 3 tablets per day*   90 tablet   1   . hydroxychloroquine (PLAQUENIL) 200 MG tablet   Oral   Take 200 mg by mouth daily. As directed by Dr. Corliss Skains          . lansoprazole (PREVACID) 30 MG capsule      TAKE ONE CAPSULE BY MOUTH EVERY DAY   30 capsule   6   . lisinopril (PRINIVIL,ZESTRIL) 20 MG tablet   Oral  Take 20 mg by mouth daily.         . Memantine HCl ER (NAMENDA XR) 28 MG CP24   Oral   Take 1 capsule by mouth daily.         . metoprolol (LOPRESSOR) 50 MG tablet   Oral   Take 25 mg by mouth 2 (two) times daily.         .  mirabegron ER (MYRBETRIQ) 50 MG TB24   Oral   Take 50 mg by mouth daily.         . Multiple Vitamin (MULTIVITAMIN) tablet   Oral   Take 1 tablet by mouth daily.           . Omega-3 Fatty Acids (FISH OIL) 1000 MG CAPS   Oral   Take 1 capsule by mouth daily.           . pilocarpine (SALAGEN) 5 MG tablet   Oral   Take 5 mg by mouth 2 (two) times daily. As directed by Dr. Corliss Skains          . polyethylene glycol (MIRALAX / GLYCOLAX) packet   Oral   Take 17 g by mouth daily.          . sennosides-docusate sodium (SENOKOT-S) 8.6-50 MG tablet   Oral   Take 2 tablets by mouth at bedtime.          . hydrocortisone (ANUSOL-HC) 2.5 % rectal cream      Apply to rectal area after each BM   30 g   11   . nystatin (MYCOSTATIN) 100000 UNIT/ML suspension      1 teaspoonful to gargle and swallow 4 times daily as needed          BP 147/58  Pulse 49  Temp(Src) 97.9 F (36.6 C) (Oral)  Resp 14  SpO2 98% Physical Exam  Nursing note and vitals reviewed. Constitutional: She is oriented to person, place, and time. She appears well-developed and well-nourished. No distress.  HENT:  Head: Normocephalic and atraumatic.  Mouth/Throat: Oropharynx is clear and moist. Mucous membranes are dry.  Eyes: Conjunctivae and EOM are normal. Pupils are equal, round, and reactive to light.  Neck: Normal range of motion. Neck supple.  Cardiovascular: Regular rhythm and intact distal pulses.  Bradycardia present.   No murmur heard. Pulmonary/Chest: Effort normal and breath sounds normal. No respiratory distress. She has no wheezes. She has no rales.  Abdominal: Soft. She exhibits no distension. There is no tenderness. There is no rebound and no guarding.  Musculoskeletal: Normal range of motion. She exhibits no edema and no tenderness.  Neurological: She is alert and oriented to person, place, and time.  Skin: Skin is warm and dry. No rash noted. No erythema.  Psychiatric: She has a normal  mood and affect. Her behavior is normal.    ED Course  Procedures (including critical care time) Labs Reviewed  BASIC METABOLIC PANEL - Abnormal; Notable for the following:    Potassium 2.6 (*)    Chloride 95 (*)    Glucose, Bld 134 (*)    GFR calc non Af Amer 85 (*)    All other components within normal limits  URINALYSIS, ROUTINE W REFLEX MICROSCOPIC - Abnormal; Notable for the following:    APPearance CLOUDY (*)    All other components within normal limits  CBC  POCT I-STAT TROPONIN I   Dg Chest 2 View  08/20/2012   *RADIOLOGY REPORT*  Clinical Data: Chest pain  CHEST - 2 VIEW  Comparison: 11/21/2011  Findings: Emphysematous changes and scattered fibrosis in the lungs.  Peribronchial thickening suggesting chronic bronchitis. Normal heart size and pulmonary vascularity.  No focal airspace consolidation in the lungs.  No blunting of costophrenic angles. No pneumothorax.  Degenerative changes in the spine.  No significant change since previous study.  IMPRESSION: Emphysematous and chronic bronchitic changes in the lungs.  No evidence of active pulmonary disease.   Original Report Authenticated By: Burman Nieves, M.D.    Date: 08/21/2012  Rate: 61  Rhythm: normal sinus rhythm  QRS Axis: normal  Intervals: normal  ST/T Wave abnormalities: nonspecific ST/T changes  Conduction Disutrbances:Incomplete right bundle branch block and first degree AV block  Narrative Interpretation:   Old EKG Reviewed: changes noted   1. Hypokalemia   2. Esophageal spasm     MDM   Pt with chest pain and palpitations. Patient also states has not been eating for the last 4 days and having generalized malaise. Patient is not a UTI today but was found to have hypokalemia with potassium of 2.6 which may explain her palpitations or general malaise.  Troponin is negative. Chest x-ray without acute findings. CBC within normal limits. Also patient complains of substernal chest pain which sounds more like  esophageal spasm possibly stricture. She has a history of Sjogren's disease and has had esophageal stretching in the past.  fluid made for reversal of hypokalemia.  Gwyneth Sprout, MD 08/21/12 [id number]  Gwyneth Sprout, MD 08/21/12 [phone]

## 2012-08-21 NOTE — H&P (Addendum)
[facility] History and Physical  RYA RAUSCH WUJ:[id number] DOB: 1929-04-24 DOA: 08/20/2012  Referring physician: ER physician. PCP: [name], MD  Chief Complaint: Chest pain and palpitations.  HPI: Ariana [name] is a 79 y.o. female history of Sjgren syndrome, hypertension, chronic pain, peripheral neuropathy, hyperlipidemia started having chest pain last night which was retrosternal. Pain is more like pressure and burning sensation. Patient has been having poor appetite with some difficulty swallowing or last few days. Patient also had at times nausea vomiting. Denies any abdominal pain or diarrhea. In the ER chest x-ray EKG and cardiac markers were unremarkable. Patient at this time is been admitted for further management. Patient does take digoxin and digoxin levels are pending. Patient states that over the last one month she has had progressive decline in her general condition with poor appetite. Patient has dysphagia and also has increased difficulty in swallowing her close and usually uses soft food. Denies any aspiration or choking incident. Patient has had EGD with dilation of the esophagus 7 years ago by Dr. Leone Payor.  Review of Systems: As presented in the history of presenting illness, rest negative.  Past Medical History  Diagnosis Date  . Sjogren's syndrome   . Dry eye syndrome   . Hypertension, benign   . Mitral valve prolapse   . GERD (gastroesophageal reflux disease)   . Diverticulosis of colon   . Irritable bowel syndrome   . Urinary incontinence   . Low back pain syndrome   . Fibromyalgia   . Memory loss   . Anxiety and depression   . History of adverse drug reaction   . Peripheral neuropathy     "both feet and legs"  . Shortness of breath 07/18/11    "alot lately"  . Anginal pain   . History of recurrent UTIs   . H/O hiatal hernia   . Anxiety   . Dementia   . Depression   . Abnormality of gait   . Thyroid nodule    Past Surgical History   Procedure Laterality Date  . Vesicovaginal fistula closure w/ tah    . Appendectomy    . Cholecystectomy    . Mandible surgery    . Temporomandibular joint surgery    . Cataract extraction, bilateral    . Abdominal hysterectomy    . Dental surgery      multiple tooth extractions   Social History:  reports that she has quit smoking. She has never used smokeless tobacco. She reports that she does not drink alcohol. Her drug history is not on file. Home. where does patient live-- Can do ADLs. Can patient participate in ADLs?  Allergies  Allergen Reactions  . Codeine Nausea Only    unless given with Phenergan  . Doxycycline     Unknown  . Klonopin (Clonazepam)     Causes hallucination   . Meperidine Hcl Nausea Only    unless given with Phenergan  . Naproxen   . Norflex (Orphenadrine Citrate) Nausea Only    Unless given with Phenergan  . Oxycodone-Acetaminophen Nausea Only    unless given with phenergan  . Penicillins     Unknown  . Phenothiazines     Unknown  . Propoxyphene Hcl Nausea Only    unless given with phenergan  . Stelazine     Unknown  . Sulfamethoxazole W-Trimethoprim     Unknown  . Tolectin (Tolmetin Sodium)     Unknown  . Tramadol     Unknown  .  Zoloft (Sertraline Hcl)     Caused pt to sleep a lot  . Lubiprostone Rash    Family History  Problem Relation Age of Onset  . Colon cancer    . Heart disease Father   . Pneumonia Mother   . Heart attack Mother   . Cancer Sister   . Cancer Brother       Prior to Admission medications   Medication Sig Start Date End Date Taking? Authorizing Provider  ALPRAZolam Prudy Feeler) 0.5 MG tablet Take 1 tablet (0.5 mg total) by mouth 2 (two) times daily as needed for anxiety. For anxiety 08/01/11  Yes [name], MD  aspirin EC 81 MG tablet Take 81 mg by mouth daily.   Yes Historical Provider, MD  Calcium Carbonate-Vitamin D (CALTRATE 600+D) 600-400 MG-UNIT per tablet Take 2 tablets by mouth daily.     Yes  Historical Provider, MD  carboxymethylcellulose (REFRESH TEARS) 0.5 % SOLN 1 drop 2 (two) times daily.     Yes Historical Provider, MD  cholecalciferol (VITAMIN D) 1000 UNITS tablet Take 1,000 Units by mouth daily.   Yes Historical Provider, MD  CRANBERRY PO Take 2 capsules by mouth daily.   Yes Historical Provider, MD  cycloSPORINE (RESTASIS) 0.05 % ophthalmic emulsion Place 1 drop into both eyes every 12 (twelve) hours.    Yes Historical Provider, MD  digoxin (LANOXIN) 0.125 MG tablet Take 125 mcg by mouth daily.     Yes Historical Provider, MD  diltiazem (CARDIZEM CD) 180 MG 24 hr capsule TAKE ONE CAPSULE BY MOUTH EVERY DAY 01/22/12  Yes [name], MD  donepezil (ARICEPT) 5 MG tablet Take 5 mg by mouth at bedtime as needed.   Yes Historical Provider, MD  DULoxetine (CYMBALTA) 60 MG capsule Take 60 mg by mouth daily.   Yes Historical Provider, MD  estrogens, conjugated, (PREMARIN) 0.45 MG tablet Take 0.45 mg by mouth daily. Take daily for 21 days then do not take for 7 days.   Yes Historical Provider, MD  Flaxseed, Linseed, (FLAXSEED OIL) 1000 MG CAPS Take 1 capsule by mouth 2 (two) times daily.   Yes Historical Provider, MD  folic acid (FOLVITE) 1 MG tablet TAKE 1 TABLET TWICE A DAY 07/04/12  Yes [name], MD  furosemide (LASIX) 20 MG tablet TAKE 1 TABLET BY MOUTH EVERY MORNING AS NEEDED FOR SWELLING 07/20/12  Yes [name], MD  gabapentin (NEURONTIN) 400 MG capsule Take 1 capsule (400 mg total) by mouth 3 (three) times daily. 05/21/12  Yes York Spaniel, MD  HYDROcodone-acetaminophen (NORCO/VICODIN) 5-325 MG per tablet Take 1 tablet three times a day as needed for pain * Max 3 tablets per day* 07/24/12  Yes [name], MD  hydroxychloroquine (PLAQUENIL) 200 MG tablet Take 200 mg by mouth daily. As directed by Dr. Corliss Skains    Yes Historical Provider, MD  lansoprazole (PREVACID) 30 MG capsule TAKE ONE CAPSULE BY MOUTH EVERY DAY 06/17/12  Yes [name], MD  lisinopril  (PRINIVIL,ZESTRIL) 20 MG tablet Take 20 mg by mouth daily.   Yes Historical Provider, MD  Memantine HCl ER (NAMENDA XR) 28 MG CP24 Take 1 capsule by mouth daily.   Yes Historical Provider, MD  metoprolol (LOPRESSOR) 50 MG tablet Take 25 mg by mouth 2 (two) times daily.   Yes Historical Provider, MD  mirabegron ER (MYRBETRIQ) 50 MG TB24 Take 50 mg by mouth daily.   Yes Historical Provider, MD  Multiple Vitamin (MULTIVITAMIN) tablet Take  1 tablet by mouth daily.     Yes Historical Provider, MD  Omega-3 Fatty Acids (FISH OIL) 1000 MG CAPS Take 1 capsule by mouth daily.     Yes Historical Provider, MD  pilocarpine (SALAGEN) 5 MG tablet Take 5 mg by mouth 2 (two) times daily. As directed by Dr. Corliss Skains    Yes Historical Provider, MD  polyethylene glycol [facility] Ethelene Hal) packet Take 17 g by mouth daily.    Yes Historical Provider, MD  sennosides-docusate sodium (SENOKOT-S) 8.6-50 MG tablet Take 2 tablets by mouth at bedtime.    Yes Historical Provider, MD  hydrocortisone (ANUSOL-HC) 2.5 % rectal cream Apply to rectal area after each BM 08/02/11   [name], MD  nystatin (MYCOSTATIN) 100000 UNIT/ML suspension 1 teaspoonful to gargle and swallow 4 times daily as needed    Historical Provider, MD   Physical Exam: Filed Vitals:   08/20/12 2301 08/21/12 0130  BP: 147/58   Pulse: 59 49  Temp: 97.9 F (36.6 C)   TempSrc: Oral   Resp: 20 14  SpO2: 99% 98%     General:  Well developed and moderately nourished.  Eyes: Anicteric no pallor.  ENT: No discharge from the ears eyes nose mouth.  Neck: No mass felt.  Cardiovascular: S1-S2 heard.  Respiratory: No rhonchi or crepitations.  Abdomen: Soft nontender bowel sounds present.  Skin: No rash.  Musculoskeletal: No edema.  Psychiatric: Appears normal.  Neurologic: Alert awake oriented to time place and person. Moves all extremities.  Labs on Admission:  Basic Metabolic Panel:  Recent Labs Lab 08/20/12 2310  NA 139  K 2.6*   CL 95*  CO2 32  GLUCOSE 134*  BUN 8  CREATININE 0.56  CALCIUM 9.5   Liver Function Tests: No results found for this basename: AST, ALT, ALKPHOS, BILITOT, PROT, ALBUMIN,  in the last 168 hours No results found for this basename: LIPASE, AMYLASE,  in the last 168 hours No results found for this basename: AMMONIA,  in the last 168 hours CBC:  Recent Labs Lab 08/20/12 2310  WBC 8.7  HGB 13.3  HCT 39.9  MCV 91.3  PLT 211   Cardiac Enzymes: No results found for this basename: CKTOTAL, CKMB, CKMBINDEX, TROPONINI,  in the last 168 hours  BNP (last 3 results) No results found for this basename: PROBNP,  in the last 8760 hours CBG: No results found for this basename: GLUCAP,  in the last 168 hours  Radiological Exams on Admission: Dg Chest 2 View  08/20/2012   *RADIOLOGY REPORT*  Clinical Data: Chest pain  CHEST - 2 VIEW  Comparison: 11/21/2011  Findings: Emphysematous changes and scattered fibrosis in the lungs.  Peribronchial thickening suggesting chronic bronchitis. Normal heart size and pulmonary vascularity.  No focal airspace consolidation in the lungs.  No blunting of costophrenic angles. No pneumothorax.  Degenerative changes in the spine.  No significant change since previous study.  IMPRESSION: Emphysematous and chronic bronchitic changes in the lungs.  No evidence of active pulmonary disease.   Original Report Authenticated By: Burman Nieves, M.D.    EKG: Independently reviewed. Normal sinus rhythm.  Assessment/Plan Principal Problem:   Chest pain Active Problems:   HYPERTENSION, BENIGN   SJOGREN'S SYNDROME   Dysphagia   Hypokalemia   1. Chest pain with palpitations - cycle cardiac markers. Aspirin. Nitroglycerin when necessary. Check thyroid function test for palpitations If cardiac markers are negative then may consider GI cause and may consult Dr. Leone Payor patient's gastroenterologist. Patient  is being placed on Protonix. 2. Dysphagia with nausea and vomiting -  digoxin levels are pending. Check LFTs and lipase. Consult GI as patient has had previous history of esophageal dilation. 3. Hypokalemia - probably secondary to poor appetite and patient taking Lasix. Hold Lasix and gently hydrate and replace potassium and recheck metabolic panel. 4. Sjogren's syndrome - continue present medications. Patient is on Plaquenil. 5. Hypertension - continue present medications. Hold Lasix as patient is dehydrated.    Code Status: Full code.  Family Communication: Patient's daughter at the bedside.  Disposition Plan: Admit to inpatient.    KAKRAKANDY,ARSHAD N. [facility] Pager [phone].  If 7PM-7AM, please contact night-coverage [email or web address] Password [email or web address] 08/21/2012, 3:33 AM

## 2012-08-21 NOTE — Consult Note (Signed)
Referring Provider: [facility] Primary Care Physician:  [name], MD Primary Gastroenterologist:  Dr. Leone Payor  Reason for Consultation:  Chest Pain  HPI: Ariana [name] is a 79 y.o. female with multiple medical problems including dementia for which she is on Namenda and Aricept, Sjgren syndrome, fibromyalgia, peripheral neuropathy, anxiety, depression, chronic back pain,. Patient is on Lanoxin for reasons that are unclear to me but apparently has history of palpitations. She also has diverticular disease and history of GERD though she is not on a PPI at home. She's known remotely to Dr. Doreatha Martin of our last had EGD in 2002 showing a 4 cm hiatal hernia she had Maloney dilation of the duodenal bulb? At that time. Last colonoscopy was done in April of 2010 per Dr. Leone Payor and showed moderate sigmoid diverticulosis. Patient was admitted early this morning after she came to the emergency room with complaints of chest pain and palpitations. She is a somewhat rambling historian but says that she's not been feeling well over the past month in general and has been having problems with insomnia. Along with this she has developed an increased feeling of palpitations and had been having intermittent lower chest pain which she says seems to, after the palpitations. Now over the past one week she has been having more constant lower chest pain which seems to be worse with movement and deep breaths. She says that she has had several falls but the last one was about 2 months ago and is unaware of any more recent injury et Karie Soda. She says "my bones feels sore in my chest" Apparently her appetite has also been decreased. On specific questioning she does not really relate any dysphagia or odynophagia but says that sometimes she chokes with her pills especially when she takes a hole handful. She complains of constipation and feeling bloated and her abdomen. Also through conversation she complained of headaches,  increased neuropathy and pain in her lower extremities, shoulder pain and back pain Initial workup in the emergency room with no EKG changes, troponin is negative x1, potassium was low at 2.6. Chest x-ray shows COPD.   Past Medical History  Diagnosis Date  . Sjogren's syndrome   . Dry eye syndrome   . Hypertension, benign   . Mitral valve prolapse   . GERD (gastroesophageal reflux disease)   . Diverticulosis of colon   . Irritable bowel syndrome   . Urinary incontinence   . Low back pain syndrome   . Fibromyalgia   . Memory loss   . Anxiety and depression   . History of adverse drug reaction   . Peripheral neuropathy     "both feet and legs"  . Shortness of breath 07/18/11    "alot lately"  . Anginal pain   . History of recurrent UTIs   . H/O hiatal hernia   . Anxiety   . Dementia   . Depression   . Abnormality of gait   . Thyroid nodule     Past Surgical History  Procedure Laterality Date  . Vesicovaginal fistula closure w/ tah    . Appendectomy    . Cholecystectomy    . Mandible surgery    . Temporomandibular joint surgery    . Cataract extraction, bilateral    . Abdominal hysterectomy    . Dental surgery      multiple tooth extractions    Prior to Admission medications   Medication Sig Start Date End Date Taking? Authorizing Provider  ALPRAZolam Prudy Feeler) 0.5 MG  tablet Take 1 tablet (0.5 mg total) by mouth 2 (two) times daily as needed for anxiety. For anxiety 08/01/11  Yes [name], MD  aspirin EC 81 MG tablet Take 81 mg by mouth daily.   Yes Historical Provider, MD  Calcium Carbonate-Vitamin D (CALTRATE 600+D) 600-400 MG-UNIT per tablet Take 2 tablets by mouth daily.     Yes Historical Provider, MD  carboxymethylcellulose (REFRESH TEARS) 0.5 % SOLN 1 drop 2 (two) times daily.     Yes Historical Provider, MD  cholecalciferol (VITAMIN D) 1000 UNITS tablet Take 1,000 Units by mouth daily.   Yes Historical Provider, MD  CRANBERRY PO Take 2 capsules by mouth  daily.   Yes Historical Provider, MD  cycloSPORINE (RESTASIS) 0.05 % ophthalmic emulsion Place 1 drop into both eyes every 12 (twelve) hours.    Yes Historical Provider, MD  digoxin (LANOXIN) 0.125 MG tablet Take 125 mcg by mouth daily.     Yes Historical Provider, MD  diltiazem (CARDIZEM CD) 180 MG 24 hr capsule TAKE ONE CAPSULE BY MOUTH EVERY DAY 01/22/12  Yes [name], MD  donepezil (ARICEPT) 5 MG tablet Take 5 mg by mouth at bedtime as needed.   Yes Historical Provider, MD  DULoxetine (CYMBALTA) 60 MG capsule Take 60 mg by mouth daily.   Yes Historical Provider, MD  estrogens, conjugated, (PREMARIN) 0.45 MG tablet Take 0.45 mg by mouth daily. Take daily for 21 days then do not take for 7 days.   Yes Historical Provider, MD  Flaxseed, Linseed, (FLAXSEED OIL) 1000 MG CAPS Take 1 capsule by mouth 2 (two) times daily.   Yes Historical Provider, MD  folic acid (FOLVITE) 1 MG tablet TAKE 1 TABLET TWICE A DAY 07/04/12  Yes [name], MD  furosemide (LASIX) 20 MG tablet TAKE 1 TABLET BY MOUTH EVERY MORNING AS NEEDED FOR SWELLING 07/20/12  Yes [name], MD  gabapentin (NEURONTIN) 400 MG capsule Take 1 capsule (400 mg total) by mouth 3 (three) times daily. 05/21/12  Yes York Spaniel, MD  HYDROcodone-acetaminophen (NORCO/VICODIN) 5-325 MG per tablet Take 1 tablet three times a day as needed for pain * Max 3 tablets per day* 07/24/12  Yes [name], MD  hydroxychloroquine (PLAQUENIL) 200 MG tablet Take 200 mg by mouth daily. As directed by Dr. Corliss Skains    Yes Historical Provider, MD  lansoprazole (PREVACID) 30 MG capsule TAKE ONE CAPSULE BY MOUTH EVERY DAY 06/17/12  Yes [name], MD  lisinopril (PRINIVIL,ZESTRIL) 20 MG tablet Take 20 mg by mouth daily.   Yes Historical Provider, MD  Memantine HCl ER (NAMENDA XR) 28 MG CP24 Take 1 capsule by mouth daily.   Yes Historical Provider, MD  metoprolol (LOPRESSOR) 50 MG tablet Take 25 mg by mouth 2 (two) times daily.   Yes Historical Provider, MD   mirabegron ER (MYRBETRIQ) 50 MG TB24 Take 50 mg by mouth daily.   Yes Historical Provider, MD  Multiple Vitamin (MULTIVITAMIN) tablet Take 1 tablet by mouth daily.     Yes Historical Provider, MD  Omega-3 Fatty Acids (FISH OIL) 1000 MG CAPS Take 1 capsule by mouth daily.     Yes Historical Provider, MD  pilocarpine (SALAGEN) 5 MG tablet Take 5 mg by mouth 2 (two) times daily. As directed by Dr. Corliss Skains    Yes Historical Provider, MD  polyethylene glycol [facility] Ethelene Hal) packet Take 17 g by mouth daily.    Yes Historical Provider, MD  sennosides-docusate sodium (  SENOKOT-S) 8.6-50 MG tablet Take 2 tablets by mouth at bedtime.    Yes Historical Provider, MD  hydrocortisone (ANUSOL-HC) 2.5 % rectal cream Apply to rectal area after each BM 08/02/11   [name], MD  nystatin (MYCOSTATIN) 100000 UNIT/ML suspension 1 teaspoonful to gargle and swallow 4 times daily as needed    Historical Provider, MD    Current Facility-Administered Medications  Medication Dose Route Frequency Provider Last Rate Last Dose  . 0.9 % NaCl with KCl 40 mEq / L  infusion   Intravenous Continuous Elease Etienne, MD 50 mL/hr at 08/21/12 1055 50 mL/hr at 08/21/12 1055  . acetaminophen (TYLENOL) tablet 650 mg  650 mg Oral Q6H PRN Eduard Clos, MD       Or  . acetaminophen (TYLENOL) suppository 650 mg  650 mg Rectal Q6H PRN Eduard Clos, MD      . ALPRAZolam Prudy Feeler) tablet 0.5 mg  0.5 mg Oral BID PRN Eduard Clos, MD      . aspirin tablet 325 mg  325 mg Oral Daily Eduard Clos, MD   325 mg at 08/21/12 0948  . calcium-vitamin D (OSCAL WITH D) 500-200 MG-UNIT per tablet 2 tablet  2 tablet Oral Daily Eduard Clos, MD   2 tablet at 08/21/12 0949  . cycloSPORINE (RESTASIS) 0.05 % ophthalmic emulsion 1 drop  1 drop Both Eyes Q12H Eduard Clos, MD      . digoxin (LANOXIN) tablet 125 mcg  125 mcg Oral Daily Eduard Clos, MD   125 mcg at 08/21/12 0950  . diltiazem (CARDIZEM CD)  24 hr capsule 180 mg  180 mg Oral Daily Eduard Clos, MD   180 mg at 08/21/12 0950  . donepezil (ARICEPT) tablet 5 mg  5 mg Oral QHS Eduard Clos, MD      . DULoxetine (CYMBALTA) DR capsule 60 mg  60 mg Oral Daily Eduard Clos, MD   60 mg at 08/21/12 0949  . folic acid (FOLVITE) tablet 1 mg  1 mg Oral Daily Eduard Clos, MD   1 mg at 08/21/12 0950  . gabapentin (NEURONTIN) capsule 400 mg  400 mg Oral TID Eduard Clos, MD   400 mg at 08/21/12 0950  . HYDROcodone-acetaminophen (NORCO/VICODIN) 5-325 MG per tablet 1 tablet  1 tablet Oral Q4H PRN Eduard Clos, MD   1 tablet at 08/21/12 0557  . hydrocortisone (ANUSOL-HC) 2.5 % rectal cream   Rectal PRN Eduard Clos, MD      . hydroxychloroquine (PLAQUENIL) tablet 200 mg  200 mg Oral Daily Eduard Clos, MD   200 mg at 08/21/12 0948  . lisinopril (PRINIVIL,ZESTRIL) tablet 20 mg  20 mg Oral Daily Eduard Clos, MD   20 mg at 08/21/12 0949  . Memantine HCl ER CP24 28 mg  1 capsule Oral Daily Eduard Clos, MD      . metoprolol tartrate (LOPRESSOR) tablet 25 mg  25 mg Oral BID Eduard Clos, MD   25 mg at 08/21/12 0948  . mirabegron ER (MYRBETRIQ) tablet 50 mg  50 mg Oral Daily Eduard Clos, MD   50 mg at 08/21/12 0948  . multivitamin with minerals tablet 1 tablet  1 tablet Oral Daily Eduard Clos, MD   1 tablet at 08/21/12 0950  . nitroGLYCERIN (NITROSTAT) SL tablet 0.4 mg  0.4 mg Sublingual Q5 min PRN Eduard Clos, MD      .  omega-3 acid ethyl esters (LOVAZA) capsule 1 g  1 capsule Oral Daily Eduard Clos, MD   1 g at 08/21/12 0950  . ondansetron (ZOFRAN) tablet 4 mg  4 mg Oral Q6H PRN Eduard Clos, MD       Or  . ondansetron [facility]) injection 4 mg  4 mg Intravenous Q6H PRN Eduard Clos, MD   4 mg at 08/21/12 1055  . pantoprazole (PROTONIX) injection 40 mg  40 mg Intravenous Q24H Eduard Clos, MD   40 mg at 08/21/12 0947  .  pilocarpine (SALAGEN) tablet 5 mg  5 mg Oral BID Eduard Clos, MD   5 mg at 08/21/12 0950  . polyethylene glycol (MIRALAX / GLYCOLAX) packet 17 g  17 g Oral Daily Eduard Clos, MD   17 g at 08/21/12 0947  . polyvinyl alcohol (LIQUIFILM TEARS) 1.4 % ophthalmic solution 1 drop  1 drop Both Eyes BID Eduard Clos, MD   1 drop at 08/21/12 0947  . potassium chloride 20 MEQ/15ML (10%) liquid 40 mEq  40 mEq Oral Once Elease Etienne, MD      . senna-docusate (Senokot-S) tablet 2 tablet  2 tablet Oral QHS Eduard Clos, MD      . sodium chloride 0.9 % injection 3 mL  3 mL Intravenous Q12H Eduard Clos, MD      . sucralfate (CARAFATE) 1 GM/10ML suspension 1 g  1 g Oral TID WC & HS Elease Etienne, MD        Allergies as of 08/20/2012 - Review Complete 08/20/2012  Allergen Reaction Noted  . Codeine Nausea Only   . Doxycycline    . Klonopin (clonazepam)  07/14/2011  . Meperidine hcl Nausea Only   . Naproxen    . Norflex (orphenadrine citrate) Nausea Only 04/08/2011  . Oxycodone-acetaminophen Nausea Only   . Penicillins    . Phenothiazines  04/08/2011  . Propoxyphene hcl Nausea Only   . Stelazine  04/08/2011  . Sulfamethoxazole w-trimethoprim    . Tolectin (tolmetin sodium)  04/08/2011  . Tramadol    . Zoloft (sertraline hcl)  07/14/2011  . Lubiprostone Rash 06/09/2008    Family History  Problem Relation Age of Onset  . Colon cancer    . Heart disease Father   . Pneumonia Mother   . Heart attack Mother   . Cancer Sister   . Cancer Brother     History   Social History  . Marital Status: Widowed    Spouse Name: N/A    Number of Children: 2  . Years of Education: N/A   Occupational History  . Retired    Social History Main Topics  . Smoking status: Former Games developer  . Smokeless tobacco: Never Used  . Alcohol Use: No  . Drug Use: Not on file  . Sexually Active: No   Other Topics Concern  . Not on file   Social History Narrative  . No  narrative on file    Review of Systems: Pertinent positive and negative review of systems were noted in the above HPI section.  All other review of systems was otherwise negative. Physical Exam: Vital signs in last 24 hours: Temp:  [97.8 F (36.6 C)-98 F (36.7 C)] 98 F (36.7 C) (07/09 0451) Pulse Rate:  [49-59] 55 (07/09 0947) Resp:  [14-20] 16 (07/09 0451) BP: (147-172)/(49-63) 155/61 mmHg (07/09 0947) SpO2:  [96 %-99 %] 98 % (07/09 0451) Weight:  [119 lb 14.9  oz (54.4 kg)] 119 lb 14.9 oz (54.4 kg) (07/09 0500) Last BM Date: 08/20/12 General:   Alert,  Well-developed, frail-appearing thin elderly white female in no acute distress Head:  Normocephalic and atraumatic. Eyes:  Sclera clear, no icterus.   Conjunctiva pink. Ears:  Normal auditory acuity. Nose:  No deformity, discharge,  or lesions. Mouth:  No deformity or lesions.   Neck:  Supple; no masses or thyromegaly. Lungs:  Clear throughout to auscultation.   No wheezes, crackles, or rhonchi. She is quite tender to palpation of the lower sternum and xiphoid.   Heart:  Regular rate and rhythm; no murmurs, clicks, rubs,  or gallops. Abdomen:  Soft no focal tenderness , somewhat doughy feeling abdomen, BS active,nonpalp mass or hsm.   Rectal:  Deferred  Msk:  Symmetrical without gross deformities. . Pulses:  Normal pulses noted. Extremities:  Without clubbing or edema. Neurologic:  Alert and  oriented x4;  grossly normal neurologically Skin:  Intact without significant lesions or rashes.. Psych:  Alert and cooperative. Normal mood .  Intake/Output from previous day: 07/08 0701 - 07/09 0700 In: 207.5 [P.O.:120; I.V.:87.5] Out: 750 [Urine:750] Intake/Output this shift: Total I/O In: -  Out: 400 [Urine:400]  Lab Results:  Recent Labs  08/20/12 2310 08/21/12 0526  WBC 8.7 7.1  HGB 13.3 12.6  HCT 39.9 37.8  PLT 211 187   BMET  Recent Labs  08/20/12 2310 08/21/12 0526  NA 139 142  K 2.6* 3.1*  CL 95* 100   CO2 32 34*  GLUCOSE 134* 114*  BUN 8 6  CREATININE 0.56 0.54  CALCIUM 9.5 9.0   LFT  Recent Labs  08/21/12 0526  PROT 6.6  ALBUMIN 3.2*  AST 19  ALT 14  ALKPHOS 64  BILITOT 0.3      Studies/Results: Dg Chest 2 View  08/20/2012   *RADIOLOGY REPORT*  Clinical Data: Chest pain  CHEST - 2 VIEW  Comparison: 11/21/2011  Findings: Emphysematous changes and scattered fibrosis in the lungs.  Peribronchial thickening suggesting chronic bronchitis. Normal heart size and pulmonary vascularity.  No focal airspace consolidation in the lungs.  No blunting of costophrenic angles. No pneumothorax.  Degenerative changes in the spine.  No significant change since previous study.  IMPRESSION: Emphysematous and chronic bronchitic changes in the lungs.  No evidence of active pulmonary disease.   Original Report Authenticated By: Burman Nieves, M.D.    IMPRESSION:  #89  79 year old female with multiple medical problems admitted with 1 week history of lower chest pain and complaints of intermittent palpitations . By history and on exam her chest pain is most consistent with musculoskeletal chest wall pain, or costochondritis. #2 palpitations-workup in progress, patient on Lanoxin at home #3 GERD by history-no dysphagia or odynophagia to food. She had a very remote esophageal dilation. Will rule out stricture or motility disorder #4 dementia #5 Fibromyalgia #6 Sjgren's syndrome #7 peripheral neuropathy #8 diverticulosis  PLAN: #1 start Protonix 40 mg once daily #2 schedule for barium swallow Further plans pending results of above   Amy Esterwood  08/21/2012, 11:17 AM     ________________________________________________________________________  [facility] MD note:  I personally examined the patient, reviewed the data and agree with the assessment and plan described above.  Low chest pain with some point tenderness, I favor her usual chronic chest pains.  Agree with barium esophagram and  starting once daily PPI for now.   Rob Bunting, MD [facility] Pager [phone]

## 2012-08-22 ENCOUNTER — Inpatient Hospital Stay (HOSPITAL_COMMUNITY): Payer: Medicare Other

## 2012-08-22 DIAGNOSIS — I10 Essential (primary) hypertension: Secondary | ICD-10-CM

## 2012-08-22 LAB — BASIC METABOLIC PANEL
CO2: 31 mEq/L (ref 19–32)
Calcium: 9 mg/dL (ref 8.4–10.5)
Creatinine, Ser: 0.52 mg/dL (ref 0.50–1.10)
GFR calc non Af Amer: 87 mL/min — ABNORMAL LOW (ref >90)
Glucose, Bld: 110 mg/dL — ABNORMAL HIGH (ref 70–99)
Sodium: 141 mEq/L (ref 135–145)

## 2012-08-22 MED ORDER — SALINE SPRAY 0.65 % NA SOLN
1.0000 | NASAL | Status: DC | PRN
  Administered 2012-08-23: 1 via NASAL
  Filled 2012-08-22: qty 44

## 2012-08-22 MED ORDER — DILTIAZEM HCL ER COATED BEADS 120 MG PO CP24
120.0000 mg | ORAL_CAPSULE | Freq: Every day | ORAL | Status: DC
Start: 2012-08-23 — End: 2012-08-24
  Administered 2012-08-23: 120 mg via ORAL
  Filled 2012-08-22: qty 1

## 2012-08-22 NOTE — Progress Notes (Signed)
Patient ID: Ariana [name], female   DOB: 04/29/1929, 79 y.o.   MRN: [id number] [facility] Progress Note  Subjective: Feels a little better today- had Bm's , eating soft diet without difficulty.Chest still hurting and "sore" , still c/o intermittent palpitations  Objective:  Vital signs in last 24 hours: Temp:  [97.8 F (36.6 C)-98.3 F (36.8 C)] 97.8 F (36.6 C) (07/10 0449) Pulse Rate:  [48-57] 51 (07/10 0734) Resp:  [18] 18 (07/10 0449) BP: (122-176)/(46-63) 176/61 mmHg (07/10 0734) SpO2:  [94 %-98 %] 98 % (07/10 0449) Weight:  [121 lb 6.4 oz (55.067 kg)] 121 lb 6.4 oz (55.067 kg) (07/10 0449) Last BM Date: 08/21/12 General:   Alert,  Well-developed, elderly WF  in NAD Heart:  Regular rate and rhythm; no murmurs- tender over lower sternum Pulm;clear Abdomen:  Soft, nontender and nondistended. Normal bowel sounds, without guarding, and without rebound.   Extremities:  Without edema. Neurologic:  Alert and  oriented x4;  grossly normal neurologically. Psych:  Alert and cooperative. Normal mood and affect.  Lab Results:  Recent Labs  08/20/12 2310 08/21/12 0526  WBC 8.7 7.1  HGB 13.3 12.6  HCT 39.9 37.8  PLT 211 187   BMET  Recent Labs  08/20/12 2310 08/21/12 0526 08/22/12 0435  NA 139 142 141  K 2.6* 3.1* 4.2  CL 95* 100 105  CO2 32 34* 31  GLUCOSE 134* 114* 110*  BUN 8 6 3*  CREATININE 0.56 0.54 0.52  CALCIUM 9.5 9.0 9.0   LFT  Recent Labs  08/21/12 0526  PROT 6.6  ALBUMIN 3.2*  AST 19  ALT 14  ALKPHOS 64  BILITOT 0.3   PT/INR No results found for this basename: LABPROT, INR,  in the last 72 hours Hepatitis Panel No results found for this basename: HEPBSAG, HCVAB, HEPAIGM, HEPBIGM,  in the last 72 hours  Assessment / Plan: #1 79 yo female  With chest pain -likely musculoskeletal/chest wall /sternal pain- will try moist heat- ? Occult fracture vs strain- has had recent falls #2 Vague dysphagia- prob mild stricture on Barium swallow-  discussed with pt- will schedule for EGD  With dilation  in am with Dr. Jacobs #3 palpitations #4 fibromyalgia #5 IBS/constipation #6 hypokalemia- corrected #7 sjogrens syndrome     LOS: 2 days   Amy Esterwood  08/22/2012, 10:14 AM   ________________________________________________________________________  [facility] MD note:  I personally examined the patient, reviewed the data and agree with the assessment and plan described above.  Planning on EGD tomorrow.   Daniel Jacobs, MD [facility] Pager [phone]     

## 2012-08-22 NOTE — Progress Notes (Deleted)
Patient's HR has been sustaing at 80's atrial fib. BP remained stable. Metoprolol tab started, cardizem drip stopped as ordered. Will continue to monitor patent.

## 2012-08-22 NOTE — Progress Notes (Signed)
[facility] PROGRESS NOTE  Ariana [name] ZOX:[id number] DOB: Nov 09, 1929 DOA: 08/20/2012 PCP: Ariana Mcalpine, MD Primary Cardiologist: Dr. Zoila [name] Primary GI: Dr. Stan [name]  Brief narrative 79 year old female patient with extensive PMH-Sjogren syndrome, HTN, chronic pain/chest wall pain, palpitations, peripheral neuropathy, HL, esophageal dilatation, normal nuclear stress test 05/09/2001 and coronary angiogram 02/17/2003 showed no significant coronary artery disease, normal LV systolic function and mitral valve prolapse, was admitted to the [facility] on 08/21/12 with complaints of lower substernal/epigastric pain, nausea and vomiting and palpitations. In the ED, chest x-ray, EKG and cardiac enzymes unremarkable.   Assessment/Plan: 1. Atypical chest pain/epigastric pain: DD-GI etiology-GERD/PUD/gastritis/stricture versus musculoskeletal-costochondritis. Less likely cardiac in etiology. Thus far workup negative for MI. [facility] input appreciated. Continue PPI and added Carafate. GI plans for EGD in a.m. +/- esophageal dilatation. Barium swallow with abnormal findings as below. Pain seems to be better. 2. Nausea, vomiting and? Dysphagia: [facility] input appreciated. Barium swallow results as below-seems abnormal-GI plans EGD in a.m. Digoxin level low. No further episodes of nausea or vomiting. 3. Hypokalemia: Corrected. 4. HTN: Mildly uncontrolled. Continue home medications. Discussed with patient's primary cardiologist, Dr. Jeanmarie [name] to DC digoxin and reduce Cardizem CD to 120 mg daily secondary to her bradycardia. She may have history of SVT/A. fib-he will review records and call back. May have to increase lisinopril dose. 5. Sjogren syndrome: Continue Plaquenil. 6. Chronic chest pain and palpitations: Continue home medications. 7. Mild dementia. 8. Chronic headaches: Unclear etiology. Check CT [name].  Code Status: Full Family Communication: Discussed with Ms. Ariana [name], daughter on 7/9. Disposition Plan: Home when medically stable.   Consultants:  [name]  Procedures:  None  Antibiotics:  None   HPI/Subjective: Indicates lower mid substernal/epigastric pain is better. Denies nausea vomiting or dysphagia. Chronic headaches.  Objective: Filed Vitals:   08/21/12 2056 08/22/12 0449 08/22/12 0734 08/22/12 1349  BP: 160/63 174/61 176/61 154/51  Pulse: 57 57 51 52  Temp: 98.3 F (36.8 C) 97.8 F (36.6 C)  98 F (36.7 C)  TempSrc: Oral Oral  Oral  Resp: 18 18  18   Height:      Weight:  55.067 kg (121 lb 6.4 oz)    SpO2: 97% 98%  97%    Intake/Output Summary (Last 24 hours) at 08/22/12 1645 Last data filed at 08/22/12 1500  Gross per 24 hour  Intake   2040 ml  Output   3725 ml  Net  -1685 ml   Filed Weights   08/21/12 0451 08/21/12 0500 08/22/12 0449  Weight: 54.4 kg (119 lb 14.9 oz) 54.4 kg (119 lb 14.9 oz) 55.067 kg (121 lb 6.4 oz)    Exam:   General exam: Comfortable. Chronically ill-looking female.  Respiratory system: Clear. No increased work of breathing.  Cardiovascular system: S1 & S2 heard, RRR. No JVD, murmurs, gallops, clicks or pedal edema. Telemetry shows sinus bradycardia  in the 40s-50s with first degree AV block.  Gastrointestinal system: Abdomen is nondistended, soft and reproducible epigastric/xiphisternum tenderness but no rigidity, guarding or rebound. Normal bowel sounds heard.  Central nervous system: Alert and oriented. No focal neurological deficits.  Extremities: Symmetric 5 x 5 power.   Data Reviewed: Basic Metabolic Panel:  Recent Labs Lab 08/20/12 2310 08/21/12 0526 08/21/12 1035 08/22/12 0435  NA 139 142  --  141  K 2.6* 3.1*  --  4.2  CL 95* 100  --  105  CO2 32 34*  --  31  GLUCOSE  134* 114*  --  110*  BUN 8 6  --  3*  CREATININE 0.56 0.54  --  0.52  CALCIUM 9.5 9.0  --  9.0  MG  --   --  1.8  --    Liver Function Tests:  Recent Labs Lab 08/21/12 0526  AST 19   ALT 14  ALKPHOS 64  BILITOT 0.3  PROT 6.6  ALBUMIN 3.2*    Recent Labs Lab 08/21/12 0526  LIPASE 31   No results found for this basename: AMMONIA,  in the last 168 hours CBC:  Recent Labs Lab 08/20/12 2310 08/21/12 0526  WBC 8.7 7.1  NEUTROABS  --  3.4  HGB 13.3 12.6  HCT 39.9 37.8  MCV 91.3 91.5  PLT 211 187   Cardiac Enzymes:  Recent Labs Lab 08/21/12 0526 08/21/12 1035 08/21/12 1651  TROPONINI <0.30 <0.30 <0.30   BNP (last 3 results) No results found for this basename: PROBNP,  in the last 8760 hours CBG: No results found for this basename: GLUCAP,  in the last 168 hours  No results found for this or any previous visit (from the past 240 hour(s)).   Studies: Dg Chest 2 View  08/20/2012   *RADIOLOGY REPORT*  Clinical Data: Chest pain  CHEST - 2 VIEW  Comparison: 11/21/2011  Findings: Emphysematous changes and scattered fibrosis in the lungs.  Peribronchial thickening suggesting chronic bronchitis. Normal heart size and pulmonary vascularity.  No focal airspace consolidation in the lungs.  No blunting of costophrenic angles. No pneumothorax.  Degenerative changes in the spine.  No significant change since previous study.  IMPRESSION: Emphysematous and chronic bronchitic changes in the lungs.  No evidence of active pulmonary disease.   Original Report Authenticated By: Burman Nieves, M.D.   Dg Esophagus  08/21/2012   *RADIOLOGY REPORT*  Clinical Data:Difficulty swallowing.  Rule out stricture.  ESOPHAGUS/BARIUM SWALLOW/TABLET STUDY  Fluoroscopy Time: 1 minute and 47 seconds.  Comparison: None.  Findings: No laryngeal penetration or aspiration.  Slightly blunted primary esophageal stripping wave.  Mild smooth narrowing distal esophagus.  When the patient ingested a 13 mm barium tablet, this did not clear from the distal esophagus (above the distal smooth narrowing) until the patient swallowed multiple mouthfuls of water and barium.  Minimal reflux occurred.  No  discrete esophageal mucosal abnormality noted.  IMPRESSION: Slightly blunted primary esophageal stripping wave.  Mild smooth narrowing distal esophagus.  When the patient ingested a 13 mm barium tablet, this did not clear from the distal esophagus (above the distal smooth narrowing) until the patient swallowed multiple mouthfuls of water and barium.  Minimal gastroesophageal reflux.  No discrete esophageal mucosal abnormality noted.   Original Report Authenticated By: Lacy Duverney, M.D.     Additional labs:   Scheduled Meds: . calcium-vitamin D  2 tablet Oral Daily  . cycloSPORINE  1 drop Both Eyes Q12H  . digoxin  125 mcg Oral Daily  . diltiazem  180 mg Oral Daily  . donepezil  5 mg Oral QHS  . DULoxetine  60 mg Oral Daily  . folic acid  1 mg Oral Daily  . gabapentin  400 mg Oral TID  . hydroxychloroquine  200 mg Oral Daily  . lisinopril  20 mg Oral Daily  . Memantine HCl ER  1 capsule Oral Daily  . metoprolol  25 mg Oral BID  . mirabegron ER  50 mg Oral Daily  . multivitamin with minerals  1 tablet Oral Daily  . omega-3  acid ethyl esters  1 capsule Oral Daily  . pantoprazole (PROTONIX) IV  40 mg Intravenous Q24H  . pilocarpine  5 mg Oral BID  . polyethylene glycol  17 g Oral Daily  . polyvinyl alcohol  1 drop Both Eyes BID  . senna-docusate  2 tablet Oral QHS  . sodium chloride  3 mL Intravenous Q12H  . sucralfate  1 g Oral TID WC & HS   Continuous Infusions:    Principal Problem:   Chest pain Active Problems:   HYPERTENSION, BENIGN   SJOGREN'S SYNDROME   Dysphagia   Hypokalemia    Time spent: 25 minutes.    [facility]  [facility] Pager [phone].   If 8PM-8AM, please contact night-coverage at [email or web address], password [facility] 08/22/2012, 4:45 PM  LOS: 2 days

## 2012-08-23 ENCOUNTER — Encounter (HOSPITAL_COMMUNITY): Payer: Self-pay | Admitting: *Deleted

## 2012-08-23 ENCOUNTER — Inpatient Hospital Stay (HOSPITAL_COMMUNITY): Payer: Medicare Other

## 2012-08-23 ENCOUNTER — Encounter (HOSPITAL_COMMUNITY)
Admission: EM | Disposition: A | Discharge status: Home Health | Payer: Self-pay | Source: Home / Self Care | Attending: Internal Medicine

## 2012-08-23 DIAGNOSIS — K2970 Gastritis, unspecified, without bleeding: Principal | ICD-10-CM

## 2012-08-23 DIAGNOSIS — K2990 Gastroduodenitis, unspecified, without bleeding: Principal | ICD-10-CM

## 2012-08-23 DIAGNOSIS — K222 Esophageal obstruction: Secondary | ICD-10-CM

## 2012-08-23 HISTORY — PX: ESOPHAGOGASTRODUODENOSCOPY (EGD) WITH ESOPHAGEAL DILATION: SHX5812

## 2012-08-23 LAB — SEDIMENTATION RATE: Sed Rate: 5 mm/hr (ref 0–22)

## 2012-08-23 SURGERY — ESOPHAGOGASTRODUODENOSCOPY (EGD) WITH ESOPHAGEAL DILATION
Anesthesia: Moderate Sedation

## 2012-08-23 MED ORDER — SALINE SPRAY 0.65 % NA SOLN: 1.0000 | NASAL | Status: DC | PRN

## 2012-08-23 MED ORDER — FENTANYL CITRATE 0.05 MG/ML IJ SOLN
INTRAMUSCULAR | Status: DC | PRN
  Administered 2012-08-23: 12.5 ug via INTRAVENOUS
  Administered 2012-08-23: 25 ug via INTRAVENOUS

## 2012-08-23 MED ORDER — FENTANYL CITRATE 0.05 MG/ML IJ SOLN
INTRAMUSCULAR | Status: AC
  Filled 2012-08-23: qty 2

## 2012-08-23 MED ORDER — MIDAZOLAM HCL 10 MG/2ML IJ SOLN
INTRAMUSCULAR | Status: DC | PRN
  Administered 2012-08-23: 2 mg via INTRAVENOUS
  Administered 2012-08-23: 1 mg via INTRAVENOUS

## 2012-08-23 MED ORDER — MIDAZOLAM HCL 10 MG/2ML IJ SOLN
INTRAMUSCULAR | Status: AC
  Filled 2012-08-23: qty 4

## 2012-08-23 MED ORDER — DILTIAZEM HCL ER COATED BEADS 120 MG PO CP24: 120.0000 mg | ORAL_CAPSULE | Freq: Every day | ORAL | Status: DC

## 2012-08-23 MED ORDER — SODIUM CHLORIDE 0.9 % IV SOLN
INTRAVENOUS | Status: DC
  Administered 2012-08-23: 08:00:00 via INTRAVENOUS

## 2012-08-23 MED ORDER — BUTAMBEN-TETRACAINE-BENZOCAINE 2-2-14 % EX AERO
INHALATION_SPRAY | CUTANEOUS | Status: DC | PRN
  Administered 2012-08-23: 2 via TOPICAL

## 2012-08-23 NOTE — Op Note (Signed)
[facility] [address] [place], [address]   ENDOSCOPY PROCEDURE REPORT  PATIENT: Ariana [name], Ariana [name]  MR#: [id number] BIRTHDATE: 1929/03/29 , 83  yrs. old GENDER: Female ENDOSCOPIST: Rachael Fee, MD PROCEDURE DATE:  08/23/2012 PROCEDURE:  EGD w/ biopsy ASA CLASS:     Class III INDICATIONS:  intermittent dysphagia, dyspepsia, Barium esophagram yesterday suggested smooth narrowing in distal esophagus. MEDICATIONS: Fentanyl 37.5 mcg IV and Versed 3 mg IV TOPICAL ANESTHETIC: Cetacaine Spray  DESCRIPTION OF PROCEDURE: After the risks benefits and alternatives of the procedure were thoroughly explained, informed consent was obtained.  The Pentax Gastroscope [id number] endoscope was introduced through the mouth and advanced to the second portion of the duodenum. Without limitations.  The instrument was slowly withdrawn as the mucosa was fully examined.      There was mild, non-specific distal gastritis.  This was biopsied, sent to pathology.  The esophagus was a bit tortuous distally but there were no frank strictures or stenosis.  The examination was otherwise normal.  Retroflexed views revealed no abnormalities. The scope was then withdrawn from the patient and the procedure completed.  COMPLICATIONS: There were no complications. ENDOSCOPIC IMPRESSION: There was mild, non-specific distal gastritis.  This was biopsied, sent to pathology.  The esophagus was a bit tortuous distally but there were no frank strictures or stenosis.  The examination was otherwise normal.  RECOMMENDATIONS: If biopsies show H.  pylori, she will be started on the correct antibiotics.  She should chew her food well, eat slowly and take small bites.  Ok for discharge from GI standpoint.    eSigned:  Rachael Fee, MD 08/23/2012 8:44 AM   CC: Stan Head, MD

## 2012-08-23 NOTE — Progress Notes (Signed)
Pt stated to S.W that she did not want to go to SNF and she wanted to go home. Spoke with pt dtr about d/c planning. Dtr stated that if the MD ordered home health with aides that insurance would pay for 35hrs/week. Dtr educated regarding safety risk of pt going home and not having 24 hour supervision. Dtr stated she understood. Benjiman Sedgwick A

## 2012-08-23 NOTE — Discharge Summary (Signed)
Physician Discharge Summary  Ariana [name] QIO:[id number] DOB: Oct 19, 1929 DOA: 08/20/2012  PCP: Ariana Mcalpine, MD  Admit date: 08/20/2012 Discharge date: 08/23/2012  Time spent: Greater than 30 minutes  Recommendations for Outpatient Follow-up:  1. Dr. Alroy Dust, PCP in 1 week. Followup gastric biopsy results and if positive, will need treatment. 2. Dr. Lesly Dukes, Neurology in 1 week 3. Dr. Zoila Shutter, Cardiology in 2 weeks   Discharge Diagnoses:  Principal Problem:   Chest pain Active Problems:   HYPERTENSION, BENIGN   SJOGREN'S SYNDROME   Dysphagia   Hypokalemia   Esophageal stricture   Unspecified gastritis and gastroduodenitis without mention of hemorrhage   Discharge Condition: Improved & Stable  Diet recommendation: Heart healthy diet  Filed Weights   08/21/12 0500 08/22/12 0449 08/23/12 0500  Weight: 54.4 kg (119 lb 14.9 oz) 55.067 kg (121 lb 6.4 oz) 54.4 kg (119 lb 14.9 oz)    History of present illness:  79 year old female patient with extensive PMH-Sjogren syndrome, HTN, chronic pain/chest wall pain, palpitations, peripheral neuropathy, HL, esophageal dilatation, normal nuclear stress test 05/09/2001 and coronary angiogram 02/17/2003 showed no significant coronary artery disease, normal LV systolic function and mitral valve prolapse, was admitted to the [facility] on 08/21/12 with complaints of lower substernal/epigastric pain, nausea and vomiting and palpitations. In the ED, chest x-ray, EKG and cardiac enzymes unremarkable.   Hospital Course:   Atypical chest pain/epigastric pain: DD-GI etiology-GERD/PUD/gastritis/stricture versus musculoskeletal-costochondritis. Less likely cardiac in etiology. Thus far workup negative for MI. [facility] consulted and performed a barium swallow which was abnormal. This was followed by EGD today-results as below. We'll continue home PPI for gastritis. Gastric biopsy results to be followed as OP.  Gastritis: On  EGD. Continue PPI. Follow gastric biopsy was and if positive for H. pylori-will need treatment.  Nausea, vomiting and? Dysphagia: [facility] input appreciated. Barium swallow results as below- abnormal-EGD performed and results as below. Digoxin level low. Resolved.   Hypokalemia: Corrected.   HTN: Mildly uncontrolled. Reduced Cardizem dose secondary to bradycardia. Continue home dose of metoprolol and lisinopril.  Sjogren syndrome: Continue Plaquenil.   Chronic chest pain and palpitations: Continue home medications.   Mild dementia.   Chronic headaches: Unclear etiology. Per patient, exact duration of headache is unclear but on review of records-possibly a month. Has been discussing with PCP OP neurology in the waiting outpatient neurology consultation. CT head: Negative. Discussed with Dr. Anne Hahn, primary neurologist who advised checking ESR and if negative, outpatient followup with him with an early appointment (his office will call). ESR negative. Unclear etiology of headache. Patient denies significant neck pain. Has seen neurosurgery after a C-spine MRI.  History of palpitations? SVT: No clear documentation. Due to sinus bradycardia in the 40s and 50s, discussed with primary cardiologist and discontinued digoxin and reduced dose of Cardizem. Patient has been asymptomatic of this bradycardia.   Procedures:  EGD 7/11  ENDOSCOPIC IMPRESSION:  There was mild, non-specific distal gastritis. This was biopsied, sent to pathology. The esophagus was a bit tortuous distally but there were no frank strictures or stenosis. The examination was otherwise normal.   RECOMMENDATIONS:  If biopsies show H. pylori, she will be started on the correct antibiotics. She should chew her food well, eat slowly and take small bites.   Consultations:  [facility]  Discharge Exam:  Complaints: Headaches-chronic. Epigastric/lower mid substernal pain is better. No nausea, vomiting. Overall feels  better.  Filed Vitals:   08/23/12 0906 08/23/12 3244 08/23/12  1001 08/23/12 1326  BP: 145/56 140/72 159/76 149/50  Pulse:    54  Temp:   97.9 F (36.6 C) 98.2 F (36.8 C)  TempSrc:   Oral Oral  Resp: 34 24 20 20   Height:      Weight:      SpO2: 96% 96% 96% 97%     General exam: Comfortable. Chronically ill-looking female.   Respiratory system: Clear. No increased work of breathing.   Cardiovascular system: S1 & S2 heard, RRR. No JVD, murmurs, gallops, clicks or pedal edema. Telemetry shows sinus bradycardia in the 50s (was 40s overnight) with first degree AV block.   Gastrointestinal system: Abdomen is nondistended, soft and reproducible epigastric/xiphisternum tenderness-less, but no rigidity, guarding or rebound. Normal bowel sounds heard.   Central nervous system: Alert and oriented. No focal neurological deficits.   Extremities: Symmetric 5 x 5 power.   Discharge Instructions      Discharge Orders   Future Appointments Provider Department Dept Phone   09/05/2012 9:30 AM York Spaniel, MD [facility] [phone]   11/13/2012 2:00 PM Ariana Mcalpine, MD [facility] [phone]   11/25/2012 2:30 PM York Spaniel, MD [facility] [phone]   Future Orders Complete By Expires     Call MD for:  difficulty breathing, headache or visual disturbances  As directed     Call MD for:  extreme fatigue  As directed     Call MD for:  persistant dizziness or light-headedness  As directed     Call MD for:  persistant nausea and vomiting  As directed     Call MD for:  severe uncontrolled pain  As directed     Diet - low sodium heart healthy  As directed     Discharge instructions  As directed     Comments:      Chew food well, eat slowly and take small bites.    Increase activity slowly  As directed         Medication List    STOP taking these medications       digoxin 0.125 MG tablet  Commonly known as:  LANOXIN       TAKE these medications       ALPRAZolam 0.5 MG tablet  Commonly known as:  XANAX  Take 1 tablet (0.5 mg total) by mouth 2 (two) times daily as needed for anxiety. For anxiety     aspirin EC 81 MG tablet  Take 81 mg by mouth daily.     CALTRATE 600+D 600-400 MG-UNIT per tablet  Generic drug:  Calcium Carbonate-Vitamin D  Take 2 tablets by mouth daily.     cholecalciferol 1000 UNITS tablet  Commonly known as:  VITAMIN D  Take 1,000 Units by mouth daily.     CRANBERRY PO  Take 2 capsules by mouth daily.     cycloSPORINE 0.05 % ophthalmic emulsion  Commonly known as:  RESTASIS  Place 1 drop into both eyes every 12 (twelve) hours.     diltiazem 120 MG 24 hr capsule  Commonly known as:  CARDIZEM CD  Take 1 capsule (120 mg total) by mouth daily.     donepezil 5 MG tablet  Commonly known as:  ARICEPT  Take 5 mg by mouth at bedtime as needed.     DULoxetine 60 MG capsule  Commonly known as:  CYMBALTA  Take 60 mg by mouth daily.     estrogens (conjugated) 0.45 MG tablet  Commonly known as:  PREMARIN  Take 0.45 mg by mouth daily. Take daily for 21 days then do not take for 7 days.     Fish Oil 1000 MG Caps  Take 1 capsule by mouth daily.     Flaxseed Oil 1000 MG Caps  Take 1 capsule by mouth 2 (two) times daily.     folic acid 1 MG tablet  Commonly known as:  FOLVITE  TAKE 1 TABLET TWICE A DAY     furosemide 20 MG tablet  Commonly known as:  LASIX  TAKE 1 TABLET BY MOUTH EVERY MORNING AS NEEDED FOR SWELLING     gabapentin 400 MG capsule  Commonly known as:  NEURONTIN  Take 1 capsule (400 mg total) by mouth 3 (three) times daily.     HYDROcodone-acetaminophen 5-325 MG per tablet  Commonly known as:  NORCO/VICODIN  Take 1 tablet three times a day as needed for pain * Max 3 tablets per day*     hydrocortisone 2.5 % rectal cream  Commonly known as:  ANUSOL-HC  Apply to rectal area after each BM     hydroxychloroquine 200 MG tablet  Commonly known as:  PLAQUENIL   Take 200 mg by mouth daily. As directed by Dr. Corliss Skains     lansoprazole 30 MG capsule  Commonly known as:  PREVACID  TAKE ONE CAPSULE BY MOUTH EVERY DAY     lisinopril 20 MG tablet  Commonly known as:  PRINIVIL,ZESTRIL  Take 20 mg by mouth daily.     metoprolol 50 MG tablet  Commonly known as:  LOPRESSOR  Take 25 mg by mouth 2 (two) times daily.     multivitamin tablet  Take 1 tablet by mouth daily.     MYRBETRIQ 50 MG Tb24  Generic drug:  mirabegron ER  Take 50 mg by mouth daily.     NAMENDA XR 28 MG Cp24  Generic drug:  Memantine HCl ER  Take 1 capsule by mouth daily.     nystatin 100000 UNIT/ML suspension  Commonly known as:  MYCOSTATIN  1 teaspoonful to gargle and swallow 4 times daily as needed     pilocarpine 5 MG tablet  Commonly known as:  SALAGEN  Take 5 mg by mouth 2 (two) times daily. As directed by Dr. Corliss Skains     polyethylene glycol packet  Commonly known as:  MIRALAX / GLYCOLAX  Take 17 g by mouth daily.     REFRESH TEARS 0.5 % Soln  Generic drug:  carboxymethylcellulose  1 drop 2 (two) times daily.     sennosides-docusate sodium 8.6-50 MG tablet  Commonly known as:  SENOKOT-S  Take 2 tablets by mouth at bedtime.     sodium chloride 0.65 % Soln nasal spray  Commonly known as:  OCEAN  Place 1 spray into the nose as needed for congestion.       Follow-up Information   Follow up with NADEL,SCOTT M, MD. Schedule an appointment as soon as possible for a visit in 1 week.   Contact information:   [address] [place] [address] [phone]       Follow up with Lesly Dukes, MD. Schedule an appointment as soon as possible for a visit in 1 week.   Contact information:   [address] [place] [address] [phone]       Follow up with Chrystie Nose, MD. Schedule an appointment as soon as possible for a visit in 2 weeks.  Contact information:   [address] [place]  [address] [phone]        The results of significant diagnostics from this hospitalization (including imaging, microbiology, ancillary and laboratory) are listed below for reference.    Significant Diagnostic Studies: Dg Chest 2 View  08/20/2012   *RADIOLOGY REPORT*  Clinical Data: Chest pain  CHEST - 2 VIEW  Comparison: 11/21/2011  Findings: Emphysematous changes and scattered fibrosis in the lungs.  Peribronchial thickening suggesting chronic bronchitis. Normal heart size and pulmonary vascularity.  No focal airspace consolidation in the lungs.  No blunting of costophrenic angles. No pneumothorax.  Degenerative changes in the spine.  No significant change since previous study.  IMPRESSION: Emphysematous and chronic bronchitic changes in the lungs.  No evidence of active pulmonary disease.   Original Report Authenticated By: Burman Nieves, M.D.   Ct Head Wo Contrast  08/22/2012   *RADIOLOGY REPORT*  Clinical Data: Headache and generalized weakness.  CT HEAD WITHOUT CONTRAST  Technique:  Contiguous axial images were obtained from the base of the skull through the vertex without contrast.  Comparison: 11/21/2011.  Findings:  Calvarium: Post traumatic deformity of the right mid face, with atelectatic right maxillary sinus.  There is been repair of the posterior right zygomatic arch.  No acute osseous findings.  Orbits: No acute abnormality.  Brain: No evidence of acute abnormality, such as acute infarction, hemorrhage, hydrocephalus, or mass lesion/mass effect.Chronic changes include brain atrophy and patchy cerebral white matter low attenuation, most notably around the left frontal horn and left lateral ventricular atrium. Intracranial atherosclerosis.  IMPRESSION: No evidence of acute intracranial disease.   Original Report Authenticated By: Tiburcio Pea   Dg Esophagus  08/21/2012   *RADIOLOGY REPORT*  Clinical Data:Difficulty swallowing.  Rule out stricture.  ESOPHAGUS/BARIUM SWALLOW/TABLET STUDY   Fluoroscopy Time: 1 minute and 47 seconds.  Comparison: None.  Findings: No laryngeal penetration or aspiration.  Slightly blunted primary esophageal stripping wave.  Mild smooth narrowing distal esophagus.  When the patient ingested a 13 mm barium tablet, this did not clear from the distal esophagus (above the distal smooth narrowing) until the patient swallowed multiple mouthfuls of water and barium.  Minimal reflux occurred.  No discrete esophageal mucosal abnormality noted.  IMPRESSION: Slightly blunted primary esophageal stripping wave.  Mild smooth narrowing distal esophagus.  When the patient ingested a 13 mm barium tablet, this did not clear from the distal esophagus (above the distal smooth narrowing) until the patient swallowed multiple mouthfuls of water and barium.  Minimal gastroesophageal reflux.  No discrete esophageal mucosal abnormality noted.   Original Report Authenticated By: Lacy Duverney, M.D.    Microbiology: No results found for this or any previous visit (from the past 240 hour(s)).   Labs: Basic Metabolic Panel:  Recent Labs Lab 08/20/12 2310 08/21/12 0526 08/21/12 1035 08/22/12 0435  NA 139 142  --  141  K 2.6* 3.1*  --  4.2  CL 95* 100  --  105  CO2 32 34*  --  31  GLUCOSE 134* 114*  --  110*  BUN 8 6  --  3*  CREATININE 0.56 0.54  --  0.52  CALCIUM 9.5 9.0  --  9.0  MG  --   --  1.8  --    Liver Function Tests:  Recent Labs Lab 08/21/12 0526  AST 19  ALT 14  ALKPHOS 64  BILITOT 0.3  PROT 6.6  ALBUMIN 3.2*    Recent Labs Lab  08/21/12 0526  LIPASE 31   No results found for this basename: AMMONIA,  in the last 168 hours CBC:  Recent Labs Lab 08/20/12 2310 08/21/12 0526  WBC 8.7 7.1  NEUTROABS  --  3.4  HGB 13.3 12.6  HCT 39.9 37.8  MCV 91.3 91.5  PLT 211 187   Cardiac Enzymes:  Recent Labs Lab 08/21/12 0526 08/21/12 1035 08/21/12 1651  TROPONINI <0.30 <0.30 <0.30   BNP: BNP (last 3 results) No results found for this  basename: PROBNP,  in the last 8760 hours CBG: No results found for this basename: GLUCAP,  in the last 168 hours  Additional labs:  ESR: 5  Digoxin level: 0.6  Thyroid function tests: TSH 1.105, free T4: 1.04, free T3: 2.8  UA: Negative for features of UTI.   SignedMarcellus Scott  [facility] 08/23/2012, 6:33 PM

## 2012-08-23 NOTE — H&P (View-Only) (Signed)
Patient ID: Ariana [name], female   DOB: [birth year], 79 y.o.   MRN: [id number] [facility] Progress Note  Subjective: Feels a little better today- had Bm's , eating soft diet without difficulty.Chest still hurting and "sore" , still c/o intermittent palpitations  Objective:  Vital signs in last 24 hours: Temp:  [97.8 F (36.6 C)-98.3 F (36.8 C)] 97.8 F (36.6 C) (07/10 0449) Pulse Rate:  [48-57] 51 (07/10 0734) Resp:  [18] 18 (07/10 0449) BP: (122-176)/(46-63) 176/61 mmHg (07/10 0734) SpO2:  [94 %-98 %] 98 % (07/10 0449) Weight:  [121 lb 6.4 oz (55.067 kg)] 121 lb 6.4 oz (55.067 kg) (07/10 0449) Last BM Date: 08/21/12 General:   Alert,  Well-developed, elderly WF  in NAD Heart:  Regular rate and rhythm; no murmurs- tender over lower sternum Pulm;clear Abdomen:  Soft, nontender and nondistended. Normal bowel sounds, without guarding, and without rebound.   Extremities:  Without edema. Neurologic:  Alert and  oriented x4;  grossly normal neurologically. Psych:  Alert and cooperative. Normal mood and affect.  Lab Results:  Recent Labs  08/20/12 2310 08/21/12 0526  WBC 8.7 7.1  HGB 13.3 12.6  HCT 39.9 37.8  PLT 211 187   BMET  Recent Labs  08/20/12 2310 08/21/12 0526 08/22/12 0435  NA 139 142 141  K 2.6* 3.1* 4.2  CL 95* 100 105  CO2 32 34* 31  GLUCOSE 134* 114* 110*  BUN 8 6 3*  CREATININE 0.56 0.54 0.52  CALCIUM 9.5 9.0 9.0   LFT  Recent Labs  08/21/12 0526  PROT 6.6  ALBUMIN 3.2*  AST 19  ALT 14  ALKPHOS 64  BILITOT 0.3   PT/INR No results found for this basename: LABPROT, INR,  in the last 72 hours Hepatitis Panel No results found for this basename: HEPBSAG, HCVAB, HEPAIGM, HEPBIGM,  in the last 72 hours  Assessment / Plan: #1 79 yo female  With chest pain -likely musculoskeletal/chest wall /sternal pain- will try moist heat- ? Occult fracture vs strain- has had recent falls #2 Vague dysphagia- prob mild stricture on Barium swallow-  discussed with pt- will schedule for EGD  With dilation  in am with Dr. Christella Hartigan #3 palpitations #4 fibromyalgia #5 IBS/constipation #6 hypokalemia- corrected #7 sjogrens syndrome     LOS: 2 days   Amy Esterwood  08/22/2012, 10:14 AM   ________________________________________________________________________  [facility] MD note:  I personally examined the patient, reviewed the data and agree with the assessment and plan described above.  Planning on EGD tomorrow.   Rob Bunting, MD [facility] Pager [phone]

## 2012-08-23 NOTE — Interval H&P Note (Signed)
History and Physical Interval Note:  08/23/2012 8:10 AM  Ariana [name]  has presented today for surgery, with the diagnosis of dysphagia  The various methods of treatment have been discussed with the patient and family. After consideration of risks, benefits and other options for treatment, the patient has consented to  Procedure(s): ESOPHAGOGASTRODUODENOSCOPY (EGD) WITH ESOPHAGEAL DILATION (N/A) as a surgical intervention .  The patient's history has been reviewed, patient examined, no change in status, stable for surgery.  I have reviewed the patient's chart and labs.  Questions were answered to the patient's satisfaction.     Rob Bunting

## 2012-08-23 NOTE — Evaluation (Signed)
Occupational Therapy Evaluation Patient Details Name: Ariana [name] MRN: [id number] DOB: [birth year] Today's Date: 08/23/2012 Time: 2130-8657 OT Time Calculation (min): 28 min  OT Assessment / Plan / Recommendation History of present illness 79 year old female patient with extensive PMH-Sjogren syndrome, HTN, chronic pain/chest wall pain, palpitations, peripheral neuropathy, HL, esophageal dilatation, normal nuclear stress test 05/09/2001 and coronary angiogram 02/17/2003 showed no significant coronary artery disease, normal LV systolic function and mitral valve prolapse, was admitted to the [facility] on 08/21/12 with complaints of lower substernal/epigastric pain, nausea and vomiting and palpitations.   Clinical Impression   Pt was admitted with epigastric pain, nausea, vomiting and palpitations.  She is overall supervision to min guard level with adls. Pt had diarrhea during OT session and needed supervision to min guard to clean herself and assistance to clean floor.  She wears depends at home, so mess probably wouldn't have been as extensive; however, pt was fatiqued and min guard was needed on way out of bathroom as knees were buckling.  At baseline, pt is mod I with basic ADLs and has someone 9-5 for IADLs.  She will benefit from continued OT to increase independence and safety with adls.      OT Assessment  Patient needs continued OT Services    Follow Up Recommendations  Supervision/Assistance - 24 hour;Home health OT Intially 24/7 for safety as pt fatiques and needs min guard assist at times.     Barriers to Discharge      Equipment Recommendations  None recommended by OT    Recommendations for Other Services    Frequency  Min 2X/week    Precautions / Restrictions Precautions Precautions: Fall Restrictions Weight Bearing Restrictions: No   Pertinent Vitals/Pain Legs painful--not rated.  Repositioned in bed.      ADL  Grooming: Min guard;Wash/dry hands (pt's legs were  giving after using toilet/cleaning up) Where Assessed - Grooming: Supported standing Upper Body Bathing: Set up Where Assessed - Upper Body Bathing: Unsupported sitting Lower Body Bathing: Set up;Supervision/safety Where Assessed - Lower Body Bathing: Supported sit to stand Upper Body Dressing: Minimal assistance (iv) Where Assessed - Upper Body Dressing: Unsupported sitting Lower Body Dressing: Set up;Supervision/safety Where Assessed - Lower Body Dressing: Supported sit to stand Toilet Transfer: Supervision/safety;Min guard (supervision to commode; min guard to bed due to fatique) Toilet Transfer Method: Sit to Barista: Raised toilet seat with arms (or 3-in-1 over toilet) Toileting - Clothing Manipulation and Hygiene: Supervision/safety Where Assessed - Toileting Clothing Manipulation and Hygiene: Sit to stand from 3-in-1 or toilet Equipment Used: Rolling walker Transfers/Ambulation Related to ADLs: pt ambulated to and from bathroom.  Pt was steady walking to bathroom but had bowel incontinence.  She was fatiqued walking back and min guard given as pt's legs were visibly weak with knees partially buckling.  Pt has neuropathy ADL Comments: completed adl with set up/supervision.  Pt usually does this at home.      OT Diagnosis: Generalized weakness  OT Problem List: Decreased strength;Decreased activity tolerance;Impaired balance (sitting and/or standing) OT Treatment Interventions: Self-care/ADL training;Energy conservation;Patient/family education;Balance training   OT Goals(Current goals can be found in the care plan section) Acute Rehab OT Goals Patient Stated Goal: figure out what is going on OT Goal Formulation: With patient Time For Goal Achievement: 09/06/12 Potential to Achieve Goals: Good ADL Goals Pt Will Transfer to Toilet: with modified independence;ambulating;bedside commode Pt Will Perform Toileting - Clothing Manipulation and hygiene: with  modified independence Additional ADL Goal #  1: Pt will gather clothes at supervision level and complete full adl with supervision  Visit Information  Last OT Received On: 08/23/12 Assistance Needed: +1 History of Present Illness: 79 year old female patient with extensive PMH-Sjogren syndrome, HTN, chronic pain/chest wall pain, palpitations, peripheral neuropathy, HL, esophageal dilatation, normal nuclear stress test 05/09/2001 and coronary angiogram 02/17/2003 showed no significant coronary artery disease, normal LV systolic function and mitral valve prolapse, was admitted to the [facility] on 08/21/12 with complaints of lower substernal/epigastric pain, nausea and vomiting and palpitations.       Prior Functioning     Home Living Family/patient expects to be discharged to:: Private residence Living Arrangements:  (caregiver 9-5 daily; family) Home Equipment: Cane - quad;Wheelchair - manual;Bedside commode;Grab bars - tub/shower Prior Function Level of Independence: Needs assistance (iadls) Communication Communication: No difficulties Dominant Hand: Right         Vision/Perception     Cognition  Cognition Arousal/Alertness: Awake/alert Behavior During Therapy: WFL for tasks assessed/performed Overall Cognitive Status: Within Functional Limits for tasks assessed    Extremity/Trunk Assessment Upper Extremity Assessment Upper Extremity Assessment: Overall WFL for tasks assessed     Mobility Bed Mobility Bed Mobility: Supine to Sit Supine to Sit: 6: Modified independent (Device/Increase time);HOB elevated Sit to Supine: 6: Modified independent (Device/Increase time) Transfers Sit to Stand: 5: Supervision;From chair/3-in-1;With upper extremity assist Stand to Sit: 5: Supervision;To toilet Details for Transfer Assistance: vcs for hand placement     Exercise     Balance     End of Session OT - End of Session Equipment Utilized During Treatment: Rolling  walker Activity Tolerance: Patient limited by fatigue Patient left: in bed;with call bell/phone within reach  GO     Tura Roller 08/23/2012, 3:40 PM  Marica Otter, OTR/L [phone] 08/23/2012

## 2012-08-23 NOTE — Progress Notes (Signed)
Physical Therapy Treatment Patient Details Name: Ariana [name] MRN: [id number] DOB: 08/09/1929 Today's Date: 08/23/2012 Time: 0865-7846 PT Time Calculation (min): 28 min  PT Assessment / Plan / Recommendation  PT Comments   *Increased gait distance today to 35' with RW and supervision. **  Follow Up Recommendations  Home health PT;Supervision/Assistance - 24 hour;SNF     Does the patient have the potential to tolerate intense rehabilitation     Barriers to Discharge        Equipment Recommendations  None recommended by PT    Recommendations for Other Services OT consult  Frequency Min 3X/week   Progress towards PT Goals Progress towards PT goals: Progressing toward goals  Plan Current plan remains appropriate    Precautions / Restrictions Precautions Precautions: Fall Restrictions Weight Bearing Restrictions: No   Pertinent Vitals/Pain *c/o chronic B foot neuropathy pain and HA, RN aware**    Mobility  Bed Mobility Bed Mobility: Supine to Sit Supine to Sit: 6: Modified independent (Device/Increase time);HOB elevated Transfers Transfers: Sit to Stand;Stand to Sit Sit to Stand: From bed;From toilet;5: Supervision Stand to Sit: To toilet;To chair/3-in-1;5: Supervision Details for Transfer Assistance: Marland Kitchen VCS safety, hand placement Ambulation/Gait Ambulation/Gait Assistance: 4: Min guard Ambulation Distance (Feet): 80 Feet Assistive device: Rolling walker Gait Pattern: Step-through pattern;Decreased stride length;Trunk flexed Gait velocity: decreased    Exercises     PT Diagnosis:    PT Problem List:   PT Treatment Interventions:     PT Goals (current goals can now be found in the care plan section) Acute Rehab PT Goals Patient Stated Goal: feel better.home PT Goal Formulation: With patient Time For Goal Achievement: 09/04/12 Potential to Achieve Goals: Good  Visit Information  Last PT Received On: 08/23/12 Assistance Needed: +1 History of Present Illness:  79 year old female patient with extensive PMH-Sjogren syndrome, HTN, chronic pain/chest wall pain, palpitations, peripheral neuropathy, HL, esophageal dilatation, normal nuclear stress test 05/09/2001 and coronary angiogram 02/17/2003 showed no significant coronary artery disease, normal LV systolic function and mitral valve prolapse, was admitted to the [facility] on 08/21/12 with complaints of lower substernal/epigastric pain, nausea and vomiting and palpitations.    Subjective Data  Patient Stated Goal: feel better.home   Cognition  Cognition Arousal/Alertness: Awake/alert Behavior During Therapy: WFL for tasks assessed/performed Overall Cognitive Status: Within Functional Limits for tasks assessed    Balance     End of Session PT - End of Session Equipment Utilized During Treatment: Gait belt Activity Tolerance: Patient limited by fatigue Patient left: with call bell/phone within reach;in chair   GP     Tamala Ser 08/23/2012, 12:04 PM [phone]

## 2012-08-23 NOTE — Progress Notes (Signed)
Pt and pt's daughter, Samai, received extensive review of AVS packet at time of discharge. All questions regarding medication, follow up appointments, education, lab results, test results, and my chart were answered to the satisfaction of both pt and pt's daughter. One hour and twenty minutes was spent reviewing information and answering questions with pt and daughter. Contact information regarding Catering manager, Chiropodist, and patient experience office was given. Pt is to be discharged with home health, will leave via wheelchair when transportation (pt's son in law) arrives. Night RN is aware.

## 2012-08-26 ENCOUNTER — Encounter (HOSPITAL_COMMUNITY): Payer: Self-pay | Admitting: Gastroenterology

## 2012-08-27 ENCOUNTER — Telehealth: Payer: Self-pay | Admitting: Pulmonary Disease

## 2012-08-27 NOTE — Telephone Encounter (Signed)
Leigh please advise where pt can be worked in at. thanks

## 2012-08-27 NOTE — Telephone Encounter (Signed)
Spoke to the pt's daughter. They already have an appointment with Dr. Anne Hahn on 09/05/12. I have scheduled the pt for 09/06/12 at 12:00. Nothing further was needed.

## 2012-08-27 NOTE — Telephone Encounter (Signed)
Per SN---  Can add pt on SN schedule on 7/24---this is the first aval with SN.  Unless they would rather see TP..  thanks

## 2012-09-02 ENCOUNTER — Telehealth: Payer: Self-pay | Admitting: Pulmonary Disease

## 2012-09-02 NOTE — Telephone Encounter (Signed)
Per SN--   For the constipation use the miralax daily and ok to add senokot s 2 po qhs.  thanks

## 2012-09-02 NOTE — Telephone Encounter (Signed)
Ariana [name], [facility], returned triage's call.  Antionette Fairy

## 2012-09-02 NOTE — Telephone Encounter (Signed)
Spoke with Consuella Lose-- Made her aware of results per SN as listed below Consuella Lose verbalized understanding and nothing further at this time

## 2012-09-02 NOTE — Telephone Encounter (Signed)
LMOM TCB x1 for Ariana [name].

## 2012-09-02 NOTE — Telephone Encounter (Signed)
Called spoke with Ariana [name] who stated that this is an FYI for SN: Pt has had some chronic complaints including headache (family reports this complaint is routine), constipation (she is having BM's but they are small and very hard; using Miralax) and palpitations (routine complaint per family).    Ariana [name] stated that pt's VS are stable and her HR is regular.  Pt is increasingly staying in her room and bed and having some increased agitation.  Is taking alprazolam prn.  Pt has upcoming ov w/ SN 7.25.14.  Dr Kriste Basque, please advise if you have any recs for [facility].  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  Thank you.

## 2012-09-02 NOTE — Telephone Encounter (Signed)
LMTCB

## 2012-09-05 ENCOUNTER — Encounter: Payer: Self-pay | Admitting: Neurology

## 2012-09-05 ENCOUNTER — Ambulatory Visit (INDEPENDENT_AMBULATORY_CARE_PROVIDER_SITE_OTHER): Payer: Medicare Other | Admitting: Neurology

## 2012-09-05 VITALS — BP 138/57 | HR 64 | Wt 122.0 lb

## 2012-09-05 DIAGNOSIS — F0390 Unspecified dementia without behavioral disturbance: Secondary | ICD-10-CM

## 2012-09-05 DIAGNOSIS — R519 Headache, unspecified: Secondary | ICD-10-CM | POA: Insufficient documentation

## 2012-09-05 DIAGNOSIS — R51 Headache: Secondary | ICD-10-CM

## 2012-09-05 DIAGNOSIS — IMO0001 Reserved for inherently not codable concepts without codable children: Secondary | ICD-10-CM

## 2012-09-05 MED ORDER — DULOXETINE HCL 30 MG PO CPEP: 30.0000 mg | ORAL_CAPSULE | Freq: Every day | ORAL | Status: DC

## 2012-09-05 NOTE — Progress Notes (Signed)
Reason for visit: Headache  Ariana [name] is an 79 y.o. female  History of present illness:  Ms. Ariana [name] is an 79 year old right-handed white female with a history of a mild memory disturbance and a history of headaches. The patient has been treated for number of years through this practice for headaches. The patient indicates that her usual headaches are bifrontal in nature. The patient indicates that 5 weeks ago, she began having a different type of headache in the left frontotemporal region, around the left eye. The patient was admitted to the hospital around 08/20/2012 with a multitude of complaints that included the headache, chest pain, nausea, diarrhea. The patient underwent a CT scan of the brain that was unremarkable, and she underwent a sedimentation rate that was normal. The patient was found to have hypokalemia, and this was corrected. The patient was reporting palpitations around the time of the hospitalization. Digoxin was discontinued. The patient indicates that there were no medication readjustments around the time of onset of her headache. The patient has been on Cymbalta, and Aricept, and Namenda. The patient is on gabapentin as well. The patient returns to this office for an evaluation.  Past Medical History  Diagnosis Date  . Sjogren's syndrome   . Dry eye syndrome   . Hypertension, benign   . Mitral valve prolapse   . GERD (gastroesophageal reflux disease)   . Diverticulosis of colon   . Irritable bowel syndrome   . Urinary incontinence   . Low back pain syndrome   . Fibromyalgia   . Memory loss   . Anxiety and depression   . History of adverse drug reaction   . Peripheral neuropathy     "both feet and legs"  . Shortness of breath 07/18/11    "alot lately"  . Anginal pain   . History of recurrent UTIs   . H/O hiatal hernia   . Anxiety   . Dementia   . Depression   . Abnormality of gait   . Thyroid nodule   . UJWJXBJY(782.9) 09/05/2012    Past Surgical  History  Procedure Laterality Date  . Vesicovaginal fistula closure w/ tah    . Appendectomy    . Cholecystectomy    . Mandible surgery    . Temporomandibular joint surgery    . Cataract extraction, bilateral    . Abdominal hysterectomy    . Dental surgery      multiple tooth extractions  . Esophagogastroduodenoscopy (egd) with esophageal dilation N/A 08/23/2012    Procedure: ESOPHAGOGASTRODUODENOSCOPY (EGD) WITH ESOPHAGEAL DILATION;  Surgeon: Rachael Fee, MD;  Location: [facility] ENDOSCOPY;  Service: Endoscopy;  Laterality: N/A;    Family History  Problem Relation Age of Onset  . Colon cancer    . Heart disease Father   . Pneumonia Mother   . Heart attack Mother   . Cancer Sister   . Cancer Brother     Social history:  reports that she has quit smoking. She has never used smokeless tobacco. She reports that she does not drink alcohol or use illicit drugs.  Allergies:  Allergies  Allergen Reactions  . Codeine Nausea Only    unless given with Phenergan  . Doxycycline     Unknown  . Klonopin (Clonazepam)     Causes hallucination   . Meperidine Hcl Nausea Only    unless given with Phenergan  . Naproxen   . Norflex (Orphenadrine Citrate) Nausea Only    Unless given with Phenergan  . Oxycodone-Acetaminophen  Nausea Only    unless given with phenergan  . Penicillins     Unknown  . Phenothiazines     Unknown  . Propoxyphene Hcl Nausea Only    unless given with phenergan  . Stelazine     Unknown  . Sulfamethoxazole W-Trimethoprim     Unknown  . Tolectin (Tolmetin Sodium)     Unknown  . Tramadol     Unknown  . Zoloft (Sertraline Hcl)     Caused pt to sleep a lot  . Lubiprostone Rash    Medications:  Current Outpatient Prescriptions on File Prior to Visit  Medication Sig Dispense Refill  . ALPRAZolam (XANAX) 0.5 MG tablet Take 1 tablet (0.5 mg total) by mouth 2 (two) times daily as needed for anxiety. For anxiety  60 tablet  5  . aspirin EC 81 MG tablet Take 81 mg  by mouth daily.      . Calcium Carbonate-Vitamin D (CALTRATE 600+D) 600-400 MG-UNIT per tablet Take 2 tablets by mouth daily.        . carboxymethylcellulose (REFRESH TEARS) 0.5 % SOLN 1 drop 2 (two) times daily.        . cholecalciferol (VITAMIN D) 1000 UNITS tablet Take 1,000 Units by mouth daily.      Marland Kitchen CRANBERRY PO Take 2 capsules by mouth daily.      Marland Kitchen diltiazem (CARDIZEM CD) 120 MG 24 hr capsule Take 1 capsule (120 mg total) by mouth daily.  30 capsule  0  . donepezil (ARICEPT) 5 MG tablet Take 5 mg by mouth at bedtime.       . DULoxetine (CYMBALTA) 60 MG capsule Take 60 mg by mouth daily.      Marland Kitchen estrogens, conjugated, (PREMARIN) 0.45 MG tablet Take 0.45 mg by mouth daily. Take daily for 21 days then do not take for 7 days.      . Flaxseed, Linseed, (FLAXSEED OIL) 1000 MG CAPS Take 1 capsule by mouth 2 (two) times daily.      . folic acid (FOLVITE) 1 MG tablet TAKE 1 TABLET TWICE A DAY  60 tablet  6  . furosemide (LASIX) 20 MG tablet TAKE 1 TABLET BY MOUTH EVERY MORNING AS NEEDED FOR SWELLING  30 tablet  6  . gabapentin (NEURONTIN) 400 MG capsule Take 1 capsule (400 mg total) by mouth 3 (three) times daily.  90 capsule  5  . HYDROcodone-acetaminophen (NORCO/VICODIN) 5-325 MG per tablet Take 1 tablet three times a day as needed for pain * Max 3 tablets per day*  90 tablet  1  . hydroxychloroquine (PLAQUENIL) 200 MG tablet Take 200 mg by mouth daily. As directed by Dr. Corliss Skains       . lansoprazole (PREVACID) 30 MG capsule TAKE ONE CAPSULE BY MOUTH EVERY DAY  30 capsule  6  . lisinopril (PRINIVIL,ZESTRIL) 20 MG tablet Take 20 mg by mouth daily.      . Memantine HCl ER (NAMENDA XR) 28 MG CP24 Take 1 capsule by mouth daily. Take 1/2 tablet twice daily      . metoprolol (LOPRESSOR) 50 MG tablet Take 25 mg by mouth 2 (two) times daily.      . mirabegron ER (MYRBETRIQ) 50 MG TB24 Take 50 mg by mouth daily.      . Multiple Vitamin (MULTIVITAMIN) tablet Take 1 tablet by mouth daily.        Marland Kitchen  nystatin (MYCOSTATIN) 100000 UNIT/ML suspension 1 teaspoonful to gargle and swallow 4 times daily as needed      .  Omega-3 Fatty Acids (FISH OIL) 1000 MG CAPS Take 1 capsule by mouth daily.        . pilocarpine (SALAGEN) 5 MG tablet Take 5 mg by mouth 2 (two) times daily. As directed by Dr. Corliss Skains       . polyethylene glycol (MIRALAX / GLYCOLAX) packet Take 17 g by mouth daily.       . sodium chloride (OCEAN) 0.65 % SOLN nasal spray Place 1 spray into the nose as needed for congestion.  30 mL  0  . cycloSPORINE (RESTASIS) 0.05 % ophthalmic emulsion Place 1 drop into both eyes every 12 (twelve) hours.       . hydrocortisone (ANUSOL-HC) 2.5 % rectal cream Apply to rectal area after each BM  30 g  11  . sennosides-docusate sodium (SENOKOT-S) 8.6-50 MG tablet Take 2 tablets by mouth at bedtime.        No current facility-administered medications on file prior to visit.    ROS:  Out of a complete 14 system review of symptoms, the patient complains only of the following symptoms, and all other reviewed systems are negative.  Chest pain, palpitations, heart murmur Difficulty swallowing Blurred vision, eye pain Shortness of breath, diarrhea Easy bruising, increased thirst Headache, numbness, weakness, difficulty swallowing Sleepiness  Blood pressure 138/57, pulse 64, weight 122 lb (55.339 kg).  Physical Exam  General: The patient is alert and cooperative at the time of the examination.  Skin: No significant peripheral edema is noted.   Neurologic Exam  Mental status: Mini-Mental status examination done today shows a total score 29/30. The patient is able to name 10 animals in 60 seconds.  Cranial nerves: Facial symmetry is present. Speech is normal, no aphasia or dysarthria is noted. Extraocular movements are full. Visual fields are full.  Motor: The patient has good strength in all 4 extremities.  Coordination: The patient has good finger-nose-finger and heel-to-shin  bilaterally.  Gait and station: The patient has an unsteady gait, the patient uses a cane for ambulation. Tandem gait is quite unsteady. Romberg is unsteady. No drift is seen.  Reflexes: Deep tendon reflexes are symmetric.   Assessment/Plan:  1. Mild memory disturbance  2. Gait disturbance  3. Headache  4. Anxiety and depression  5. Sjogren's syndrome  The patient has an almost overwhelming list of somatic complaints. The patient currently is complaining of headaches, and left leg discomfort. The patient will be set up for a carotid Doppler study, and the Cymbalta dosing will be increased taking 30 mg in the morning, 60 mg in the evening. The patient will be followed for the memory issues, and she will followup in this office in 6 months. The patient is on a multitude of medications. The patient has hydrocodone to take if needed for headache pain.  Marlan Palau MD 09/05/2012 2:06 PM  [facility] [address] [place], [place] [address]  Phone [phone] Fax [phone]

## 2012-09-06 ENCOUNTER — Ambulatory Visit (INDEPENDENT_AMBULATORY_CARE_PROVIDER_SITE_OTHER): Payer: Self-pay | Admitting: Pulmonary Disease

## 2012-09-06 ENCOUNTER — Telehealth: Payer: Self-pay | Admitting: Internal Medicine

## 2012-09-06 ENCOUNTER — Other Ambulatory Visit (INDEPENDENT_AMBULATORY_CARE_PROVIDER_SITE_OTHER): Payer: Medicare Other

## 2012-09-06 ENCOUNTER — Encounter: Payer: Self-pay | Admitting: Pulmonary Disease

## 2012-09-06 VITALS — BP 148/62 | HR 62 | Temp 96.0°F | Ht 63.5 in | Wt 122.0 lb

## 2012-09-06 DIAGNOSIS — R413 Other amnesia: Secondary | ICD-10-CM

## 2012-09-06 DIAGNOSIS — Z860100 Personal history of colon polyps, unspecified: Secondary | ICD-10-CM

## 2012-09-06 DIAGNOSIS — IMO0001 Reserved for inherently not codable concepts without codable children: Secondary | ICD-10-CM

## 2012-09-06 DIAGNOSIS — I10 Essential (primary) hypertension: Secondary | ICD-10-CM

## 2012-09-06 DIAGNOSIS — E042 Nontoxic multinodular goiter: Secondary | ICD-10-CM

## 2012-09-06 DIAGNOSIS — M545 Low back pain, unspecified: Secondary | ICD-10-CM

## 2012-09-06 DIAGNOSIS — G589 Mononeuropathy, unspecified: Secondary | ICD-10-CM

## 2012-09-06 DIAGNOSIS — R1310 Dysphagia, unspecified: Secondary | ICD-10-CM

## 2012-09-06 DIAGNOSIS — I059 Rheumatic mitral valve disease, unspecified: Secondary | ICD-10-CM

## 2012-09-06 DIAGNOSIS — K5730 Diverticulosis of large intestine without perforation or abscess without bleeding: Secondary | ICD-10-CM

## 2012-09-06 DIAGNOSIS — F341 Dysthymic disorder: Secondary | ICD-10-CM

## 2012-09-06 DIAGNOSIS — K219 Gastro-esophageal reflux disease without esophagitis: Secondary | ICD-10-CM

## 2012-09-06 DIAGNOSIS — M3500 Sicca syndrome, unspecified: Secondary | ICD-10-CM

## 2012-09-06 DIAGNOSIS — K589 Irritable bowel syndrome without diarrhea: Secondary | ICD-10-CM

## 2012-09-06 DIAGNOSIS — R32 Unspecified urinary incontinence: Secondary | ICD-10-CM

## 2012-09-06 LAB — BASIC METABOLIC PANEL
CO2: 33 mEq/L — ABNORMAL HIGH (ref 19–32)
Calcium: 9.4 mg/dL (ref 8.4–10.5)
Chloride: 101 mEq/L (ref 96–112)
Potassium: 4 mEq/L (ref 3.5–5.1)
Sodium: 137 mEq/L (ref 135–145)

## 2012-09-06 NOTE — Telephone Encounter (Signed)
Patient is scheduled for 10/04/12 3:45

## 2012-09-06 NOTE — Patient Instructions (Addendum)
Today we updated your med list in our EPIC system...    Continue your current medications the same...  Today we did a follow up metabolic panel to see if you need Potassium capsules...    We will contact you w/ the results when available...   We will arrange for a follow up appt w/ your gastroenterologist...  Let's plan a recheck in 6 weeks.Marland KitchenMarland Kitchen

## 2012-09-07 ENCOUNTER — Encounter: Payer: Self-pay | Admitting: Pulmonary Disease

## 2012-09-07 NOTE — Progress Notes (Signed)
Subjective:    Patient ID: Ariana [name], female    DOB: [birth year], 79 y.o.   MRN: [id number]  HPI 79 y/o WF here for a follow up visit... he has multiple medical problems as noted below...   ~  Jul 14, 2011:  63mo ROV & Ariana [name] is here w/ her daughter who is her caregiver; there is much conflict w/ pt being argumentative & set in her ways, resisting help from daugh, etc; especially regarding her meds> daughter notes several meds caused reactions- staggering, hallucinations, confusion (eg- Zoloft, Klonopin); pt takes Xanax as needed (admin by family) & she is really angry about not getting more pain medicine when she wants it; we discussed these issues but she has early dementia & doesn't "get-it", daugh requested Alz med & we will start DONEPIZIL 5mg /d for 367mo the 10mg /d thereafter...  Pt spent an inordinate amt of time ranting about her teeth, dentures, implants, TMJ, her severe dry mouth, etc...    We reviewed prob list, meds, xrays and labs> see below>> LABS 5/13:  Chems- ok w/ BS=105 Na=132;  CBC- wnl;  Fe=123 (30%sat);  TSH=1.56; Urine- +UTI ==> we will Rx w/ Cipro250Bid...  ~  August 02, 2011:  3wk [place] & post hospital visit>  She was Adm 6/4 - 07/25/11 by [facility] w/ UTI & urinary retension, altered mental status, & her mult other medical problems;  Her urine was abn but no growth on cult & she improved w/ Ceftin;  She had a foley placed but this was not discontinued at disch to the rehab facility- she was sent w/ it & has had f/u w/ Urology for removal;  She had just been started on Aricept (but hadn't yet taken her 1st pill) & was switched to Namenda 5mg Bid;  She was seen by Psychiatry DrBogard and started on Buspar & Cymbalta- but they do not see outpts so we will have to refer to an office based Psychiatrist for on-going help;  She was also disch on SSI but her sugars were only minimally elev & A1c=5.7, and we will stop the insulin & monitor sugars on diet alone...      We reviewed prob list, meds,  xrays and labs> see below>> CXR 6/13 showed atx & scarring LLL, NAD... CT Brain 6/13 showed cortical atrophy, NAD... CT Abd 6/13 showed distented bladder, constip, s/p hyst, left renal cyst, diverticulosis & some wall thickening in transverse colon... EKG 6/13 showed NSR, rate74, LAD, minor NSSTTWA... LABS 6/13:  CBC- Hg=11-12, WBC=6-7K;  Sed=16;  Chems- ok x elev BS (90-160), A1c=5.7;  FLP- at goals;  TFTs= wnl...  ~  August 31, 2011:  367mo ROV & [name] feels like she's a new person- improving, getting therapy, not talking to herself anymore; unfortunately she has been unable to see an outpt psychiatrist & asking social services at [facility] for help... Daughter has been helpful trying to get her the psychiatric care she needs (recall that she saw Neuro- DrReynolds (Dementia) & Psyche- DrBogard (started on Buspar & Cymbalta) during her recent hosp)...    We reviewed prob list, meds, xrays and labs> see below see below >>  ~  November 03, 2011:  23mo ROV & daugh is frustrated w/ pt (dementia, behavioral issues) & difficulty arranging for outpt psychiatric care- she had appt w/ DrPlovsky but it's been moved out twice... Pt is now at home but actually liked being at [facility] & their rehab program "I had to leave"; she & daugh seem to  be at odds esp re pt's dementia; since ret home she has fallen w/ pain in her tailbone & we discussed mentioning this to DrDeveshwar for poss XRays & to see what could possibly be done for that; they request refill of her Gabapentin 300mg Tid...  She notes several ecchymoses "THEY ARE NOT BRUISES" she emphasizes;  BP has been intermittently elev at home since disch from the NH & measures 142/70; furthermore they note intermittent swelling in ankles & we discussed compromise by adding LASIX 20mg  Qod...    She has a f/u appt coming up w/ DrHilty at [facility] (DrLittle has retired)...    She saw Urology 6/13> bladder distention w/ poor emptying; she was disch w/ foley, & had that  eventually removed w/ improved voiding & no signif PVR...     We reviewed prob list, meds, xrays and labs> see below for updates >> ADDENDUM:  ThyroidSonar 11/13> dominant left thyroid nodule biopsied ==> c/w hyperplastic nodule, no malig cells ident...  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    HBP> on Metop50-1/2Bid, Diltiazem180, Lasix20Qod, & DrHilty added Lisinopril20;  BP= 124/72 & she has mult symptoms and complaints...    MVP, CWP, Palpit> on ASA81, Lanoxin.125; followed by DrHilty now that DrLittle has retired- seen 11/13 & note reviewed...    Chol> on diet, FishOil & FlaxSeedOil; last FLP in Epic was 6/13 w/ TChol 128, TG 56, HDL 61, LDL 56    Multinod Goiter> w/ Bx of dom 4cm left lobe nodule= hyperplastic;  Thyroid function has been wnl w/ TSH 6/13= 1.16 (normal FreeT3 & FreeT4).    GI- GERD, Divertics, IBS, +FamHx colon ca> on Prev30, Bentyl20, Miralax/Senakot-S, AnusolHC; last colonoscopy 2010 by DrGessner w/ divertics, hems, melanosis...    Urinary incont> on Trimethoprim for UTI prevention per Urology...    FM, LBP, fall w/ fx coccyx (ER 10/13)> MRI of Cspine 10/13 showedCx spondylosis & mild sp stenosis- referred to Neurosurg & eval pending...    Dementia> on Namenda10Bid per DrWillis...    Neuropathy> on Gabapentin 300Tid w/ sl improvement in her symptoms...    Anxiety, Depression> followed by DrPlovsky- on Cymbalta60, Buspar10Tid, Xanax0.5prn... We reviewed prob list, meds, xrays and labs> see below for updates >>   ~  May 16, 2012:  30mo ROV & Ariana [name] says "I ain't doin" & she notes incr swelling=> rec to incr Lasix20 from prn to one  daily;  She has persistent mult somatic complaints & daugh wonders about her depression, on low dose Cymbalta, & we decided to incr to 60mg /d... We reviewed the following medical problems during today's office visit >>     Sjogren's syndrome> followed by DrDeveshwar on Plaquenil200, + tears/ Restasis, Nystatin, etc for her chronic oral discomfort...    HBP> on Metop50-1/2Bid, Diltiazem180, Lisinopril20, Lasix20prn;  BP= 128/62 & she has mult symptoms and complaints w/o change...    MVP, CWP, Palpit> on ASA81, Lanoxin.125; followed by DrHilty now that DrLittle has retired- seen 11/13 & note reviewed...    Chol> on diet, FishOil & FlaxSeedOil; FLP shows TChol 166, TG 112, HDL 62, LDL 82    Multinod Goiter> w/ Bx of  dom 4cm left lobe nodule= hyperplastic;  Thyroid function has been wnl w/ TSH = 1.33 (normal FreeT3 & FreeT4).    GI- GERD, Divertics, IBS, +FamHx colon ca> on Prev30, Miralax/Senakot-S, AnusolHC; last colonoscopy 2010 by DrGessner w/ divertics, hems, melanosis...    Urinary incont> prev on Trimethoprim for UTI prevention per Urology; DrDahlstedt added Myrbetriq50 & she is improved...    FM, LBP, fall w/ fx coccyx (ER 10/13)> MRI of Cspine 10/13 showed Cx spondylosis & mild sp stenosis- referred to Neurosurg & saw DrStern 2/14 for neck/ back/ & leg pain> his eval revealed mostly right shoulder pathology & he suggested injection, f/u w/ Rheum (seen 3/14 & note reviewed, no changes), & f/u prn w/ NS...    Dementia> on Aricept5, & Namenda10Bid per DrWillis...    Neuropathy> on Gabapentin 300Tid w/ sl improvement in her symptoms...    Anxiety, Depression> prev followed by DrPlovsky- on Cymbalta20=>60, Xanax0.5prn, & off Buspar We reviewed prob list, meds, xrays and labs> see below for updates >>  LABS 4/14:  FLP- at goals on diet rx;  Chems- wnl;  CBC- wnl;  TSH=1.33;  VitD=52;  A1c=5.7.Marland KitchenMarland Kitchen  ~  September 06, 2012:  71mo ROV & post hosp visit> Hosp 7/8 - 08/23/12 by [facility] for lower sternal/epig pain,  palpit, ?n/v- but by her hx=> "hurt so bad", couldn't sleep, crazy dreams, bloating, loose stool but sometimes hard & "I have to mash it";  She was seen by [facility] and BaSwallow showed motility disorder, smooth narrowing distally, min GE reflux;  EGD showed tort esoph, no stricture, mild distal gastritis & Bx showed benign gastric mucosa, neg HPylori, Rec to continue PPI;  We discussed the diff betw a structural abn of the GI track & a functional abn of the GI tract & suggested that the majority of her prob is the l;atter- Rec antireflux regimen, continue Prev30 taken 30 min before the 1st meal of the day, etc;  They would like to have drGessner more involved in the management of her GI issues and we will request a follow up appt for her...     She has hx of GERD, Divertics, IBS, +FamHx colon ca> on Prev30, Miralax/Senakot-S, AnusolHC; last colonoscopy 2010 by DrGessner w/ divertics, hems, melanosis...    Sjogrens is treated w/ Paquenil, tears/restasis, Nystatin for her chr oral discomfort & she is followed by Rheum- DrDeveshwar...    HBP is treated w/ Metop25Bid, Diltiazem120 (decr from 180 due to [place]), Lisin20, Lasix20 prn; BP= 148/62 & she has mult somatic complaints; they stopped her Digoxin during the recent hosp due to [place]... We reviewed prob list, meds, xrays and labs> see below for updates >>  LABS 7/14:  BMet is essent wnl- continue current meds...           Problem List:  SJOGREN'S SYNDROME (ICD-710.2), & DRY EYE SYNDROME (ICD-375.15) - eval by DrHecker & DrDeveshwar 5/11 w/ extensive lab work done & Dx of Sjogren's syndrome & started on Pilocarpine 5mg Bid... DrHecker has her on a number of eye drops, & prev thought to be dry eyes w/ ocular rosacea & tubes placed in tear ducts... also seen by Mikey Kirschner...  ~  10/11:  DrDeveshwar started PLAQUENIL 200mg /d... Seen every 35mo by Rheum. ~  Most recent note from [facility] 4/13> Sjogren's, Raynaud's, LBP, musc spasms, on Plaquenil Rx & no  changes made. ~  4/14: followed by DrDeveshwar on Plaquenil200, Tears/ Restasis, Nystatin, etc for her chronic oral discomfort & stable...  CHRONIC ORAL DISCOMFORT -  this has been looked into by mult physicians and no other etiology discovered (other than her sicca syndrome)... she uses Magic Mouthwash Prn + sialogogues...  HYPERTENSION, BENIGN (ICD-401.1) >>  ~  controlled on TOPROL XL 50mg - 1/2 tabBid, DILTIAZEM CD 180mg /d, & LASIX 20mg  Qod...   ~  5/12: BP= 134/80 today, takes meds regularly & tol well... denies CP, worsening palipit, syncope, dyspnea, edema, etc... ~  11/12: BP= 122/60 & she remains asymptomatic... ~  5/13:  BP= 122/70 & she denies CP, palpit, change in SOB, edema, etc... ~  CXR 6/13 showed stable bronchitic changes w/ thickening of airways, scarring at lung bases, no acute changes. ~  4/14: on Metop50-1/2Bid, Diltiazem180, Lisinopril20, Lasix20prn;  BP= 128/62 & she has mult symptoms and complaints w/o change. ~  7/14: post hosp visit on Metop25Bid, Diltiazem120 (decr from 180 due to [place]), Lisin20, Lasix20 prn; BP= 148/62 & she has mult somatic complaints; they stopped her Digoxin during the recent hosp due to [place].  MITRAL VALVE PROLAPSE (ICD-424.0) -  ~  Followed for yrs by drLittle- on ASA 81mg /d, & LANOXIN 0.125mg /d, his notes are reviewed> MVP, HBP, hx palpit, neg cath... ~  cath 1/05 by DrLittle showed clean coronaries & norm LVF w/ +MVP... ~  1/11:  Seen by DrLittle for f/u MVP & palpit; BP was sl elev & he rec incr Metoprolol to 25mg  Bid. ~  5/14: she had Cards f/u DrHilty> HBP, MVP, palpit- on  Dig0.125, DiltiazemER180, Lasix20Qod, Lisin20, Metop25Bid per his list; contin same, f/u 67yr. ~  7/14: post hosp visit on Metop25Bid, Diltiazem120 (decr from 180 due to [place]), Lisin20, Lasix20 prn; BP= 148/62 & she has mult somatic complaints; they stopped her Digoxin during the recent hosp due to [place].   HYPERLIPIDEMIA>  On low chol, low fat diet alone... She takes  Fish Oil, flax seed oil, etc. ~  2008:  FLP showed TChol 170, TG 176, HDL 50,  ~  5/12:  FLP showed TChol 142, TG 202, HDL 62, LDL 166 ~  FLP 6/13 showed TChol 128, TG 56, HDL 61, LDL 56 ~  4/14: on diet, FishOil & FlaxSeedOil; FLP shows TChol 166, TG 112, HDL 62, LDL 82  Elev Blood Sugar >> occurred during the 6/13 hosp & covered by SSI during the hosp & sent to rehab center w/ this==> discontinued on office follow up... ~  Labs 4/14 showed BS= 96, A1c= 5.7  MULTINODULAR THYROID >> Asymptomatic thyroid nodule ident on MRI scan of neck... ~  10/13:  Thyroid Ultrasound showed multinodular gland w/ dominant nodule measuring 4cm on left lobe... ~  11/13:  Thyroid Biopsy by IR> neg- c/w hyperplastic nodule, no malig cells seen... ~  4/14:  Labs showed TSH= 1.33  GERD (ICD-530.81) >> ~  on PREVACID 30mg /d... denies reflux symptoms, swallowing OK x for dryness, etc... ~  last EGD 9/02 showed 4cmHH, GERD... ~  [facility] 7/14 by [facility], consult [facility]> BaSwallow showed motility disorder, smooth narrowing distally, min GE reflux;  EGD showed tort esoph, no stricture, mild distal gastritis & Bx showed benign gastric mucosa, neg HPylori, Rec to continue PPI;  We discussed the diff betw a structural abn of the GI track & a functional abn of the GI tract & suggested that the majority of her prob is the l;atter- Rec antireflux regimen, continue Prev30 taken 30 min before the 1st meal of the day, etc;  They would like to have drGessner more involved in the management of her GI issues  and we will request a follow up appt for her...   DIVERTICULOSIS OF COLON (ICD-562.10),  IRRITABLE BOWEL SYNDROME (ICD-564.1), & Family Hx of COLON CANCER (ICD-153.9) - followed for GI by DrSamLeBauer- now DrGessner- colonoscopy 2/06 showed a tortuous and spastic colon, divertics, otherw neg... her sister died in her 52's w/ colon cancer... f/u colonoscopy 4/10 showed mod divertics, hems, melanosis> Rx'd w/ Miralax/ Senakot-S. ~  11/11:   she notes incr IBS symptoms- rec Rx w/ BENTYL 20mg Qid Prn, Mylicon, Phazyme ==> improved.  URINARY INCONTINENCE (ICD-788.30) - eval by GYN= DrRNeal (on Premarin 0.9mg /d)... ~  7/13:  Foley discontinued, voiding better w/ freq trips to the commode to prevent leaking... ~  4/14: prev on Trimethoprim for UTI prevention per Urology; DrDahlstedt added Myrbetriq50 for OB& she is improved.  LOW BACK PAIN SYNDROME (ICD-724.2) - on VICODIN up to 3/ day... Ortho= DrMortenson, Rendall, et al... ~  5/12:  She is c/o incr pain in right side of back/ hip/ leg> refer to Ortho for further eval; Rx Vicodin prn... ~  MRI of Cspine 10/13 showed Cx spondylosis & mild sp stenosis- referred to Neurosurg & saw DrStern 2/14 for neck/ back/ & leg pain> his eval revealed mostly right shoulder pathology & he suggested injection, f/u w/ Rheum (seen 3/14 & note reviewed, no changes), & f/u prn w/ NS  FIBROMYALGIA (ICD-729.1) - she has chr fatigue symptoms and diffuse aches & pains... she has been eval by Rheum DrDeveshwar w/ recent dx of Sjogren's Syndrome & DDD- on CYCLOBENZAPRINE 10mg - 1/2-1 tab Prn...  MEMORY LOSS (ICD-780.93) - eval by DrHickling for Neurology w/ MRI done- pt states "it's not dementia, just hardening of the arteries"... his note from 4/08 indicates- OBS, gait abn & neck/ Back pain diagnoses... ~  5/13:  Daughter notes more difficulty & ready to start medication ==> Aricept 5mg /d was recommended but never started by the pt... ~  6/13:  The hospitalists started [facility] 5mg Bid in lieu of the Aricept ==> memory is no better per daughter ~  7/13:  c/o headaches and taking Vicodin as needed... ~  4/14: on Aricept5, & Namenda10Bid per DrWillis...   NEUROPATHY (ICD-355.9) - on GABAPENTIN 300mg Tid now w/ some relief; initially started on 300mg Bid per DrHickling, but she was intol w/ confusion & slurred speech... neuropathy symptoms improved on Rx. ~  6/13:  She was disch from hosp on larger dose of Neurontin  100mg - 3Tid (subseq changed to 300mg tabTid)  ANXIETY DEPRESSION (ICD-300.4) - on CYMBALTA 60mg /d & Alpraz0.5mg  as needed... prev Psychiatric Rx from DrCottle but she hasn't seen him in >40yr & refuses to return... Family requested med for depression & we tried Zoloft 25 to 50mg  /d but they stopped it due to affect on her gait & speech... Note> Klonopin in past caused hallucinations, but she tolerates the intermittent Xanax ok... ~  6/13:  She was seen by DrBogard, Psychiatrist during the 6/13 hosp & will be referred for outpt follow up... ~  Daugh has arranged for f/u appt w/ DrPlovsky => finally seen and he increased the Cymbalta to 60mg /d... ~  4/14:  prev followed by DrPlovsky- on Cymbalta20=>60, Xanax0.5prn, & off Buspar...  Hx of ADVERSE DRUG REACTION (ICD-995.20) - hx of mult drug allergies and intolerances including:  PCN, Sulfa, Doxy, NSAIDs, Demerol, Codeine, DCN100, Tramadol, Phenergan, Thorazine, Stelazine...  NOTE: she takes Vicodin for pain, and tolerates this satis...  Health Maintenance - GYN= DrNeal on Premarin, Calcium, Vits, & Fish Oil...   Past Surgical History  Procedure Laterality Date  . Vesicovaginal fistula closure w/ tah    . Appendectomy    . Cholecystectomy    . Mandible surgery    . Temporomandibular joint surgery    . Cataract extraction, bilateral    . Abdominal hysterectomy    . Dental surgery      multiple tooth extractions  . Esophagogastroduodenoscopy (egd) with esophageal dilation N/A 08/23/2012    Procedure: ESOPHAGOGASTRODUODENOSCOPY (EGD) WITH ESOPHAGEAL DILATION;  Surgeon: Rachael Fee, MD;  Location: [facility] ENDOSCOPY;  Service: Endoscopy;  Laterality: N/A;    Outpatient Encounter Prescriptions as of 09/06/2012  Medication Sig Dispense Refill  . ALPRAZolam (XANAX) 0.5 MG tablet Take 1 tablet (0.5 mg total) by mouth 2 (two) times daily as needed for anxiety. For anxiety  60 tablet  5  . aspirin EC 81 MG tablet Take 81 mg by mouth daily.      . Calcium  Carbonate-Vitamin D (CALTRATE 600+D) 600-400 MG-UNIT per tablet Take 2 tablets by mouth daily.        . carboxymethylcellulose (REFRESH TEARS) 0.5 % SOLN 1 drop 2 (two) times daily.        . cholecalciferol (VITAMIN D) 1000 UNITS tablet Take 1,000 Units by mouth daily.      Marland Kitchen CRANBERRY PO Take 2 capsules by mouth daily.      Marland Kitchen diltiazem (CARDIZEM CD) 120 MG 24 hr capsule Take 1 capsule (120 mg total) by mouth daily.  30 capsule  0  . donepezil (ARICEPT) 5 MG tablet Take 5 mg by mouth at bedtime.       . DULoxetine (CYMBALTA) 30 MG capsule Take 1 capsule (30 mg total) by mouth daily.  30 capsule  3  . DULoxetine (CYMBALTA) 60 MG capsule Take 60 mg by mouth daily.      Marland Kitchen estrogens, conjugated, (PREMARIN) 0.45 MG tablet Take 0.45 mg by mouth daily. Take daily for 21 days then do not take for 7 days.      . Flaxseed, Linseed, (FLAXSEED OIL) 1000 MG CAPS Take 1 capsule by mouth 2 (two) times daily.      . folic acid (FOLVITE) 1 MG tablet TAKE 1 TABLET TWICE A DAY  60 tablet  6  . furosemide (LASIX) 20 MG tablet TAKE 1 TABLET BY MOUTH EVERY MORNING AS NEEDED FOR SWELLING  30 tablet  6  . gabapentin (NEURONTIN) 400 MG capsule Take 1 capsule (400 mg total) by mouth 3 (three) times daily.  90 capsule  5  . HYDROcodone-acetaminophen (NORCO/VICODIN) 5-325 MG per tablet Take 1 tablet three times a day as needed for pain * Max 3 tablets per day*  90 tablet  1  . hydrocortisone (ANUSOL-HC) 2.5 % rectal cream Apply to rectal area after each BM  30 g  11  . hydroxychloroquine (PLAQUENIL) 200 MG tablet Take 200 mg by mouth daily. As directed by Dr. Corliss Skains       . lansoprazole (PREVACID) 30 MG capsule TAKE ONE CAPSULE BY MOUTH EVERY DAY  30 capsule  6  . lisinopril (PRINIVIL,ZESTRIL) 20 MG tablet Take 20 mg by mouth daily.      . Memantine HCl ER (NAMENDA XR) 28 MG CP24 Take 1 capsule by mouth daily. Take 1/2 tablet twice daily      . metoprolol (LOPRESSOR) 50 MG tablet Take 25 mg by mouth 2 (two) times daily.       . mirabegron ER (MYRBETRIQ) 50 MG TB24 Take 50 mg by mouth daily.      Marland Kitchen  Multiple Vitamin (MULTIVITAMIN) tablet Take 1 tablet by mouth daily.        Marland Kitchen nystatin (MYCOSTATIN) 100000 UNIT/ML suspension 1 teaspoonful to gargle and swallow 4 times daily as needed      . Omega-3 Fatty Acids (FISH OIL) 1000 MG CAPS Take 1 capsule by mouth daily.        . pilocarpine (SALAGEN) 5 MG tablet Take 5 mg by mouth 2 (two) times daily. As directed by Dr. Corliss Skains       . polyethylene glycol (MIRALAX / GLYCOLAX) packet Take 17 g by mouth daily.       . sennosides-docusate sodium (SENOKOT-S) 8.6-50 MG tablet Take 2 tablets by mouth at bedtime.       . sodium chloride (OCEAN) 0.65 % SOLN nasal spray Place 1 spray into the nose as needed for congestion.  30 mL  0  . [DISCONTINUED] cycloSPORINE (RESTASIS) 0.05 % ophthalmic emulsion Place 1 drop into both eyes every 12 (twelve) hours.        No facility-administered encounter medications on file as of 09/06/2012.    Allergies  Allergen Reactions  . Codeine Nausea Only    unless given with Phenergan  . Doxycycline     Unknown  . Klonopin (Clonazepam)     Causes hallucination   . Meperidine Hcl Nausea Only    unless given with Phenergan  . Naproxen   . Norflex (Orphenadrine Citrate) Nausea Only    Unless given with Phenergan  . Oxycodone-Acetaminophen Nausea Only    unless given with phenergan  . Penicillins     Unknown  . Phenothiazines     Unknown  . Propoxyphene Hcl Nausea Only    unless given with phenergan  . Stelazine     Unknown  . Sulfamethoxazole W-Trimethoprim     Unknown  . Tolectin (Tolmetin Sodium)     Unknown  . Tramadol     Unknown  . Zoloft (Sertraline Hcl)     Caused pt to sleep a lot  . Lubiprostone Rash    Current Medications, Allergies, Past Medical History, Past Surgical History, Family History, and Social History were reviewed in [employer] record.    Review of Systems        See HPI -  all other systems neg except as noted... The patient complains of decreased hearing, hoarseness, dyspnea on exertion, and abdominal pain.  The patient denies anorexia, fever, weight loss, weight gain, vision loss, chest pain, syncope, peripheral edema, prolonged cough, headaches, hemoptysis, melena, hematochezia, severe indigestion/heartburn, hematuria, incontinence, muscle weakness, suspicious skin lesions, transient blindness, difficulty walking, depression, unusual weight change, abnormal bleeding, enlarged lymph nodes, and angioedema.     Objective:   Physical Exam      WD, WN, 79 y/o WF in NAD... she has dry eyes & prominent dry mouth symptoms... GENERAL:  Alert & oriented; pleasant & cooperative...  HEENT:  [place]/AT, EOM-wnl, PERRLA, EACs-clear, TMs-wnl, NOSE-clear, THROAT-clear & excessively dry. NECK:  Supple w/ fairROM; no JVD; normal carotid impulses w/o bruits; no thyromegaly or nodules palpated; no lymphadenopathy. CHEST:  Clear to P & A; without wheezes/ rales/ or rhonchi. HEART:  Regular Rhythm; without murmurs/ rubs/ or gallops heard... ABDOMEN:  Soft & nontender; normal bowel sounds; no organomegaly or masses detected. EXT: without deformities, mild arthritic changes; no varicose veins/ venous insuffic/ or edema. NEURO:  CN's intact;  no focal neuro deficits... DERM:  No lesions noted; no rash etc...  RADIOLOGY DATA:  Reviewed in the  EPIC EMR & discussed w/ the patient...  LABORATORY DATA:  Reviewed in the EPIC EMR & discussed w/ the patient...   Assessment & Plan:    GI>  GERD, Motility disorder, ?HH, Divertics, IBS, +FamHx colon ca>  on Prev30, last colon 2010- OK, Bentyl helps, they request more active management of her functional GI issues by [employer]...   SJOGREN's>  Still w/ very dry Sicca syndrome & medication from DrDeveshwar, Ophthalmology, etc...  HBP>  Controlled on meds, continue same but add regular dosing of the Lasix 20mg  Qd...  MVP>  Followed by  DrLittle/Hilty for Cards, & stable on meds above...  HYPERLIPID>  TG sl elev & rec to get on better low fat diet...  UTI / Urinary Retension>  She was hosp 6/13 by [facility]- UTI resolved w/ Ab & Foley removed by Urology, now drDahlstedt on Myrbetriq50 & improved...  Chronic Pain Syndrome>  She states adeq control w/ Vicodin Rx...  Memory loss/ anxiety/ depression>  Prev eval by Neuro- DrHickling, DrCottle for psyche in past;  During the 6/13 [facility]- she was seen by DrBogard for  Psyche & placed on Buspar & Cymbalta; needs outpt f/u by Psyche=> DrPlovsky...   Patient's Medications  New Prescriptions   No medications on file  Previous Medications   ALPRAZOLAM (XANAX) 0.5 MG TABLET    Take 1 tablet (0.5 mg total) by mouth 2 (two) times daily as needed for anxiety. For anxiety   ASPIRIN EC 81 MG TABLET    Take 81 mg by mouth daily.   CALCIUM CARBONATE-VITAMIN D (CALTRATE 600+D) 600-400 MG-UNIT PER TABLET    Take 2 tablets by mouth daily.     CARBOXYMETHYLCELLULOSE (REFRESH TEARS) 0.5 % SOLN    1 drop 2 (two) times daily.     CHOLECALCIFEROL (VITAMIN D) 1000 UNITS TABLET    Take 1,000 Units by mouth daily.   CRANBERRY PO    Take 2 capsules by mouth daily.   DILTIAZEM (CARDIZEM CD) 120 MG 24 HR CAPSULE    Take 1 capsule (120 mg total) by mouth daily.   DONEPEZIL (ARICEPT) 5 MG TABLET    Take 5 mg by mouth at bedtime.    DULOXETINE (CYMBALTA) 30 MG CAPSULE    Take 1 capsule (30 mg total) by mouth daily.   DULOXETINE (CYMBALTA) 60 MG CAPSULE    Take 60 mg by mouth daily.   ESTROGENS, CONJUGATED, (PREMARIN) 0.45 MG TABLET    Take 0.45 mg by mouth daily. Take daily for 21 days then do not take for 7 days.   FLAXSEED, LINSEED, (FLAXSEED OIL) 1000 MG CAPS    Take 1 capsule by mouth 2 (two) times daily.   FOLIC ACID (FOLVITE) 1 MG TABLET    TAKE 1 TABLET TWICE A DAY   FUROSEMIDE (LASIX) 20 MG TABLET    TAKE 1 TABLET BY MOUTH EVERY MORNING AS NEEDED FOR SWELLING   GABAPENTIN (NEURONTIN) 400 MG CAPSULE     Take 1 capsule (400 mg total) by mouth 3 (three) times daily.   HYDROCODONE-ACETAMINOPHEN (NORCO/VICODIN) 5-325 MG PER TABLET    Take 1 tablet three times a day as needed for pain * Max 3 tablets per day*   HYDROCORTISONE (ANUSOL-HC) 2.5 % RECTAL CREAM    Apply to rectal area after each BM   HYDROXYCHLOROQUINE (PLAQUENIL) 200 MG TABLET    Take 200 mg by mouth daily. As directed by Dr. Corliss Skains    LANSOPRAZOLE (PREVACID) 30 MG CAPSULE    TAKE ONE  CAPSULE BY MOUTH EVERY DAY   LISINOPRIL (PRINIVIL,ZESTRIL) 20 MG TABLET    Take 20 mg by mouth daily.   MEMANTINE HCL ER (NAMENDA XR) 28 MG CP24    Take 1 capsule by mouth daily. Take 1/2 tablet twice daily   METOPROLOL (LOPRESSOR) 50 MG TABLET    Take 25 mg by mouth 2 (two) times daily.   MIRABEGRON ER (MYRBETRIQ) 50 MG TB24    Take 50 mg by mouth daily.   MULTIPLE VITAMIN (MULTIVITAMIN) TABLET    Take 1 tablet by mouth daily.     NYSTATIN (MYCOSTATIN) 100000 UNIT/ML SUSPENSION    1 teaspoonful to gargle and swallow 4 times daily as needed   OMEGA-3 FATTY ACIDS (FISH OIL) 1000 MG CAPS    Take 1 capsule by mouth daily.     PILOCARPINE (SALAGEN) 5 MG TABLET    Take 5 mg by mouth 2 (two) times daily. As directed by Dr. Corliss Skains    POLYETHYLENE GLYCOL [facility] Ethelene Hal) PACKET    Take 17 g by mouth daily.    SENNOSIDES-DOCUSATE SODIUM (SENOKOT-S) 8.6-50 MG TABLET    Take 2 tablets by mouth at bedtime.    SODIUM CHLORIDE (OCEAN) 0.65 % SOLN NASAL SPRAY    Place 1 spray into the nose as needed for congestion.  Modified Medications   No medications on file  Discontinued Medications   CYCLOSPORINE (RESTASIS) 0.05 % OPHTHALMIC EMULSION    Place 1 drop into both eyes every 12 (twelve) hours.

## 2012-09-10 ENCOUNTER — Ambulatory Visit (INDEPENDENT_AMBULATORY_CARE_PROVIDER_SITE_OTHER): Payer: Medicare Other

## 2012-09-10 DIAGNOSIS — F0390 Unspecified dementia without behavioral disturbance: Secondary | ICD-10-CM

## 2012-09-10 DIAGNOSIS — R51 Headache: Secondary | ICD-10-CM

## 2012-09-10 DIAGNOSIS — R269 Unspecified abnormalities of gait and mobility: Secondary | ICD-10-CM

## 2012-09-10 DIAGNOSIS — IMO0001 Reserved for inherently not codable concepts without codable children: Secondary | ICD-10-CM

## 2012-09-16 ENCOUNTER — Telehealth: Payer: Self-pay | Admitting: Pulmonary Disease

## 2012-09-16 ENCOUNTER — Telehealth: Payer: Self-pay | Admitting: Neurology

## 2012-09-16 NOTE — Telephone Encounter (Signed)
Last OV 09/06/12 Pending 10/21/12 Please advise SN if you feel skilled nursing is still needed for pt. thanks

## 2012-09-16 NOTE — Telephone Encounter (Signed)
I called the patient and I talked with the daughter. The carotid Doppler studies are unremarkable.

## 2012-09-19 NOTE — Telephone Encounter (Signed)
Per SN---  We need to call the pts daughter about this to see if she still needs these services.  Find out what qualifies her for skilled care per [facility].  thanks

## 2012-09-19 NOTE — Telephone Encounter (Signed)
I spoke with daughter. She stated they are at the point where they do not feel like it will help anything. She stated Consuella Lose has been great but doesn't really think it will do anything for pt now. Please advise SN thanks

## 2012-09-19 NOTE — Telephone Encounter (Signed)
lmomtcb for Ariana [name] from [facility]  To make her aware of pts daughter recs---they do not feel like it will help the pt to cont the PT.

## 2012-09-20 NOTE — Telephone Encounter (Signed)
Spoke with Ariana [name] is aware that daughter to patient feels it will not help to continue PT and therefore will D/C. Also stated she will d/c all nursing help as well as the daughter takes care of all the medications for the patient.

## 2012-09-24 ENCOUNTER — Other Ambulatory Visit (HOSPITAL_COMMUNITY): Payer: Self-pay | Admitting: Pulmonary Disease

## 2012-10-04 ENCOUNTER — Ambulatory Visit (INDEPENDENT_AMBULATORY_CARE_PROVIDER_SITE_OTHER): Payer: Medicare Other | Admitting: Internal Medicine

## 2012-10-04 ENCOUNTER — Encounter: Payer: Self-pay | Admitting: Internal Medicine

## 2012-10-04 VITALS — BP 94/50 | HR 68 | Ht 63.5 in | Wt 124.1 lb

## 2012-10-04 DIAGNOSIS — R1031 Right lower quadrant pain: Secondary | ICD-10-CM

## 2012-10-04 DIAGNOSIS — R079 Chest pain, unspecified: Secondary | ICD-10-CM

## 2012-10-04 DIAGNOSIS — K5900 Constipation, unspecified: Secondary | ICD-10-CM

## 2012-10-04 DIAGNOSIS — Z860100 Personal history of colon polyps, unspecified: Secondary | ICD-10-CM

## 2012-10-04 DIAGNOSIS — R197 Diarrhea, unspecified: Secondary | ICD-10-CM

## 2012-10-04 NOTE — Patient Instructions (Signed)
Please take 2 Senokot and then drink 4 doses of Miralax over 1 hour. Call me on Monday to tell me how you are - ? Did this fix things and did small loose stools stop or worsen?  I appreciate the opportunity to care for you. Iva Boop, MD, Clementeen Graham

## 2012-10-04 NOTE — Progress Notes (Signed)
  Subjective:    Patient ID: Ariana [name], female    DOB: [birth year], 79 y.o.   MRN: [id number]  HPI Hospitalized with chest pain in July Had dysphagia and ? Stx on Ba swallow No stricture on EGD Gastritis ? - bx  She has generally been constipated and was on Senokot and MiraLax but stools got loose. Did not take for 3 weeks and then she developed small loose stools in past few days. Has chronic abdominal pain and RLQ worse now.  Medications, allergies, past medical history, past surgical history, family history and social history are reviewed and updated in the EMR.   Review of Systems Weak but gaining strength    Objective:   Physical Exam Elderly, NAD Abdomen soft, mildy tender RLQ and bulge/ventral hernia there. BS+ Rectal - female staff present anoderm ok Soft brown stool in rectum - no impaction    Assessment & Plan:  Constipation  Diarrhea  RLQ abdominal pain   I am suspicious of worsening constipation and possible high impaction issue. Will purge w/ SenoKot and MiraLax and she will call back Monday (3 days). Stool too formed for C diff. Consider CT vs. Colonoscopy pending results and course. However she has known IBS-C.  Chest pain   Multifactorial - ? Some GERD, chest wall pain, fibromyalgia. At any rate its better and nothing concerning on evaluations.   Also has some mild dysphagia which is better and in part related to sicca syndrome and Sjogren's - could even have dysmotility.  I appreciate the opportunity to care for this patient. WU:[id number],JYNWG M, MD

## 2012-10-05 ENCOUNTER — Encounter: Payer: Self-pay | Admitting: Internal Medicine

## 2012-10-07 ENCOUNTER — Telehealth: Payer: Self-pay | Admitting: Internal Medicine

## 2012-10-07 MED ORDER — PEG 3350-KCL-NABCB-NACL-NASULF 240 G PO SOLR: ORAL | Status: DC

## 2012-10-07 NOTE — Telephone Encounter (Signed)
Patient advised of there recommendations.  She requested the [facility] be [facility].  She will call back tomorrow with an update

## 2012-10-07 NOTE — Telephone Encounter (Signed)
Ask her to try Colyte 2 L today and call back again tomorrow Will need to Rx that

## 2012-10-07 NOTE — Telephone Encounter (Signed)
Patient reports that she did not pass a large amount of stool.  She states she had a small about of stool after senokot and Miralax, then just lots of "brown water".  She is still very bloated and her abdomen in tender.  Dr. Leone Payor please advise?

## 2012-10-08 ENCOUNTER — Telehealth: Payer: Self-pay | Admitting: Internal Medicine

## 2012-10-08 DIAGNOSIS — R109 Unspecified abdominal pain: Secondary | ICD-10-CM

## 2012-10-08 DIAGNOSIS — K5900 Constipation, unspecified: Secondary | ICD-10-CM

## 2012-10-09 NOTE — Telephone Encounter (Signed)
Patient took colyte prep and she states that the never got clear.  She is still bloating and having pain.  Does she need KUB?

## 2012-10-09 NOTE — Telephone Encounter (Signed)
Patient asked to come for KUB.  She states she can't come until tomorrow am.

## 2012-10-09 NOTE — Telephone Encounter (Signed)
Yes please

## 2012-10-10 ENCOUNTER — Ambulatory Visit (INDEPENDENT_AMBULATORY_CARE_PROVIDER_SITE_OTHER)
Admission: RE | Admit: 2012-10-10 | Discharge: 2012-10-10 | Disposition: A | Discharge status: Home / Self Care | Payer: Medicare Other | Source: Ambulatory Visit | Attending: Internal Medicine | Admitting: Internal Medicine

## 2012-10-10 DIAGNOSIS — K5900 Constipation, unspecified: Secondary | ICD-10-CM

## 2012-10-10 DIAGNOSIS — R109 Unspecified abdominal pain: Secondary | ICD-10-CM

## 2012-10-10 NOTE — Progress Notes (Signed)
Quick Note:  I called results She needs CT abdomen/pelvis w/ cntrast re  RLQ pain, possible hepatomegaly by xray ______

## 2012-10-11 ENCOUNTER — Other Ambulatory Visit: Payer: Self-pay

## 2012-10-11 DIAGNOSIS — R1031 Right lower quadrant pain: Secondary | ICD-10-CM

## 2012-10-15 ENCOUNTER — Other Ambulatory Visit (HOSPITAL_COMMUNITY): Payer: Self-pay | Admitting: Pulmonary Disease

## 2012-10-16 ENCOUNTER — Other Ambulatory Visit: Payer: Medicare Other

## 2012-10-18 ENCOUNTER — Ambulatory Visit (INDEPENDENT_AMBULATORY_CARE_PROVIDER_SITE_OTHER)
Admission: RE | Admit: 2012-10-18 | Discharge: 2012-10-18 | Disposition: A | Discharge status: Home / Self Care | Payer: Medicare Other | Source: Ambulatory Visit | Attending: Internal Medicine | Admitting: Internal Medicine

## 2012-10-18 DIAGNOSIS — R1031 Right lower quadrant pain: Secondary | ICD-10-CM

## 2012-10-18 MED ORDER — IOHEXOL 300 MG/ML  SOLN
100.0000 mL | Freq: Once | INTRAMUSCULAR | Status: AC | PRN
  Administered 2012-10-18: 100 mL via INTRAVENOUS

## 2012-10-21 ENCOUNTER — Ambulatory Visit: Payer: Medicare Other | Admitting: Pulmonary Disease

## 2012-10-22 NOTE — Progress Notes (Signed)
Quick Note:  She is on many medications already - if she wants to try something else we can try 145 ug Linzess daily - 2 weeks of samples and she can call back Nothing bad going on medically as far as I can tell though I know she is suffering ______

## 2012-10-22 NOTE — Progress Notes (Signed)
Quick Note:  This is ok Please give me a symptom update ______

## 2012-10-23 ENCOUNTER — Other Ambulatory Visit: Payer: Self-pay

## 2012-10-23 MED ORDER — LINACLOTIDE 145 MCG PO CAPS: 145.0000 ug | ORAL_CAPSULE | Freq: Every day | ORAL | Status: DC

## 2012-10-24 ENCOUNTER — Encounter: Payer: Self-pay | Admitting: Pulmonary Disease

## 2012-10-24 ENCOUNTER — Ambulatory Visit (INDEPENDENT_AMBULATORY_CARE_PROVIDER_SITE_OTHER): Payer: Medicare Other | Admitting: Pulmonary Disease

## 2012-10-24 VITALS — BP 124/64 | HR 56 | Temp 97.6°F | Ht 63.5 in | Wt 125.0 lb

## 2012-10-24 DIAGNOSIS — M545 Low back pain, unspecified: Secondary | ICD-10-CM

## 2012-10-24 DIAGNOSIS — R413 Other amnesia: Secondary | ICD-10-CM

## 2012-10-24 DIAGNOSIS — K589 Irritable bowel syndrome without diarrhea: Secondary | ICD-10-CM

## 2012-10-24 DIAGNOSIS — R32 Unspecified urinary incontinence: Secondary | ICD-10-CM

## 2012-10-24 DIAGNOSIS — M3500 Sicca syndrome, unspecified: Secondary | ICD-10-CM

## 2012-10-24 DIAGNOSIS — F341 Dysthymic disorder: Secondary | ICD-10-CM

## 2012-10-24 DIAGNOSIS — E042 Nontoxic multinodular goiter: Secondary | ICD-10-CM

## 2012-10-24 DIAGNOSIS — K219 Gastro-esophageal reflux disease without esophagitis: Secondary | ICD-10-CM

## 2012-10-24 DIAGNOSIS — I059 Rheumatic mitral valve disease, unspecified: Secondary | ICD-10-CM

## 2012-10-24 DIAGNOSIS — I10 Essential (primary) hypertension: Secondary | ICD-10-CM

## 2012-10-24 DIAGNOSIS — IMO0001 Reserved for inherently not codable concepts without codable children: Secondary | ICD-10-CM

## 2012-10-24 DIAGNOSIS — R1310 Dysphagia, unspecified: Secondary | ICD-10-CM

## 2012-10-24 DIAGNOSIS — G589 Mononeuropathy, unspecified: Secondary | ICD-10-CM

## 2012-10-24 DIAGNOSIS — Z86010 Personal history of colonic polyps: Secondary | ICD-10-CM

## 2012-10-24 NOTE — Progress Notes (Signed)
Subjective:    Patient ID: Ariana [name], female    DOB: [birth year], 79 y.o.   MRN: [id number]  HPI 79 y/o WF here for a follow up visit... he has multiple medical problems as noted below...   ~  Jul 14, 2011:  9mo ROV & Ariana [name] is here w/ her daughter who is her caregiver; there is much conflict w/ pt being argumentative & set in her ways, resisting help from daugh, etc; especially regarding her meds> daughter notes several meds caused reactions- staggering, hallucinations, confusion (eg- Zoloft, Klonopin); pt takes Xanax as needed (admin by family) & she is really angry about not getting more pain medicine when she wants it; we discussed these issues but she has early dementia & doesn't "get-it", daugh requested Alz med & we will start DONEPIZIL 5mg /d for 83mo the 10mg /d thereafter...  Pt spent an inordinate amt of time ranting about her teeth, dentures, implants, TMJ, her severe dry mouth, etc...    We reviewed prob list, meds, xrays and labs> see below>> LABS 5/13:  Chems- ok w/ BS=105 Na=132;  CBC- wnl;  Fe=123 (30%sat);  TSH=1.56; Urine- +UTI ==> we will Rx w/ Cipro250Bid...  ~  August 02, 2011:  3wk [place] & post hospital visit>  She was Adm 6/4 - 07/25/11 by [facility] w/ UTI & urinary retension, altered mental status, & her mult other medical problems;  Her urine was abn but no growth on cult & she improved w/ Ceftin;  She had a foley placed but this was not discontinued at disch to the rehab facility- she was sent w/ it & has had f/u w/ Urology for removal;  She had just been started on Aricept (but hadn't yet taken her 1st pill) & was switched to Namenda 5mg Bid;  She was seen by Psychiatry DrBogard and started on Buspar & Cymbalta- but they do not see outpts so we will have to refer to an office based Psychiatrist for on-going help;  She was also disch on SSI but her sugars were only minimally elev & A1c=5.7, and we will stop the insulin & monitor sugars on diet alone...      We reviewed prob list, meds,  xrays and labs> see below>> CXR 6/13 showed atx & scarring LLL, NAD... CT Brain 6/13 showed cortical atrophy, NAD... CT Abd 6/13 showed distented bladder, constip, s/p hyst, left renal cyst, diverticulosis & some wall thickening in transverse colon... EKG 6/13 showed NSR, rate74, LAD, minor NSSTTWA... LABS 6/13:  CBC- Hg=11-12, WBC=6-7K;  Sed=16;  Chems- ok x elev BS (90-160), A1c=5.7;  FLP- at goals;  TFTs= wnl...  ~  August 31, 2011:  83mo ROV & Ariana [name] feels like she's a new person- improving, getting therapy, not talking to herself anymore; unfortunately she has been unable to see an outpt psychiatrist & asking social services at [facility] for help... Daughter has been helpful trying to get her the psychiatric care she needs (recall that she saw Neuro- DrReynolds (Dementia) & Psyche- DrBogard (started on Buspar & Cymbalta) during her recent hosp)...    We reviewed prob list, meds, xrays and labs> see below see below >>  ~  November 03, 2011:  79mo ROV & daugh is frustrated w/ pt (dementia, behavioral issues) & difficulty arranging for outpt psychiatric care- she had appt w/ DrPlovsky but it's been moved out twice... Pt is now at home but actually liked being at [facility] & their rehab program "I had to leave"; she & daugh seem to  be at odds esp re pt's dementia; since ret home she has fallen w/ pain in her tailbone & we discussed mentioning this to DrDeveshwar for poss XRays & to see what could possibly be done for that; they request refill of her Gabapentin 300mg Tid...  She notes several ecchymoses "THEY ARE NOT BRUISES" she emphasizes;  BP has been intermittently elev at home since disch from the NH & measures 142/70; furthermore they note intermittent swelling in ankles & we discussed compromise by adding LASIX 20mg  Qod...    She has a f/u appt coming up w/ DrHilty at [facility] (DrLittle has retired)...    She saw Urology 6/13> bladder distention w/ poor emptying; she was disch w/ foley, & had that  eventually removed w/ improved voiding & no signif PVR...     We reviewed prob list, meds, xrays and labs> see below for updates >> ADDENDUM:  ThyroidSonar 11/13> dominant left thyroid nodule biopsied ==> c/w hyperplastic nodule, no malig cells ident...  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    HBP> on Metop50-1/2Bid, Diltiazem180, Lasix20Qod, & DrHilty added Lisinopril20;  BP= 124/72 & she has mult symptoms and complaints...    MVP, CWP, Palpit> on ASA81, Lanoxin.125; followed by DrHilty now that DrLittle has retired- seen 11/13 & note reviewed...    Chol> on diet, FishOil & FlaxSeedOil; last FLP in Epic was 6/13 w/ TChol 128, TG 56, HDL 61, LDL 56    Multinod Goiter> w/ Bx of dom 4cm left lobe nodule= hyperplastic;  Thyroid function has been wnl w/ TSH 6/13= 1.16 (normal FreeT3 & FreeT4).    GI- GERD, Divertics, IBS, +FamHx colon ca> on Prev30, Bentyl20, Miralax/Senakot-S, AnusolHC; last colonoscopy 2010 by DrGessner w/ divertics, hems, melanosis...    Urinary incont> on Trimethoprim for UTI prevention per Urology...    FM, LBP, fall w/ fx coccyx (ER 10/13)> MRI of Cspine 10/13 showedCx spondylosis & mild sp stenosis- referred to Neurosurg & eval pending...    Dementia> on Namenda10Bid per DrWillis...    Neuropathy> on Gabapentin 300Tid w/ sl improvement in her symptoms...    Anxiety, Depression> followed by DrPlovsky- on Cymbalta60, Buspar10Tid, Xanax0.5prn... We reviewed prob list, meds, xrays and labs> see below for updates >>   ~  May 16, 2012:  54mo ROV & Ariana [name] says "I ain't doin" & she notes incr swelling=> rec to incr Lasix20 from prn to one  daily;  She has persistent mult somatic complaints & daugh wonders about her depression, on low dose Cymbalta, & we decided to incr to 60mg /d... We reviewed the following medical problems during today's office visit >>     Sjogren's syndrome> followed by DrDeveshwar on Plaquenil200, + tears/ Restasis, Nystatin, etc for her chronic oral discomfort...    HBP> on Metop50-1/2Bid, Diltiazem180, Lisinopril20, Lasix20prn;  BP= 128/62 & she has mult symptoms and complaints w/o change...    MVP, CWP, Palpit> on ASA81, Lanoxin.125; followed by DrHilty now that DrLittle has retired- seen 11/13 & note reviewed...    Chol> on diet, FishOil & FlaxSeedOil; FLP shows TChol 166, TG 112, HDL 62, LDL 82    Multinod Goiter> w/ Bx of  dom 4cm left lobe nodule= hyperplastic;  Thyroid function has been wnl w/ TSH = 1.33 (normal FreeT3 & FreeT4).    GI- GERD, Divertics, IBS, +FamHx colon ca> on Prev30, Miralax/Senakot-S, AnusolHC; last colonoscopy 2010 by DrGessner w/ divertics, hems, melanosis...    Urinary incont> prev on Trimethoprim for UTI prevention per Urology; DrDahlstedt added Myrbetriq50 & she is improved...    FM, LBP, fall w/ fx coccyx (ER 10/13)> MRI of Cspine 10/13 showed Cx spondylosis & mild sp stenosis- referred to Neurosurg & saw DrStern 2/14 for neck/ back/ & leg pain> his eval revealed mostly right shoulder pathology & he suggested injection, f/u w/ Rheum (seen 3/14 & note reviewed, no changes), & f/u prn w/ NS...    Dementia> on Aricept5, & Namenda10Bid per DrWillis...    Neuropathy> on Gabapentin 300Tid w/ sl improvement in her symptoms...    Anxiety, Depression> prev followed by DrPlovsky- on Cymbalta20=>60, Xanax0.5prn, & off Buspar We reviewed prob list, meds, xrays and labs> see below for updates >>  LABS 4/14:  FLP- at goals on diet rx;  Chems- wnl;  CBC- wnl;  TSH=1.33;  VitD=52;  A1c=5.7.Marland KitchenMarland Kitchen  ~  September 06, 2012:  85mo ROV & post hosp visit> Hosp 7/8 - 08/23/12 by [facility] for lower sternal/epig pain,  palpit, ?n/v- but by her hx=> "hurt so bad", couldn't sleep, crazy dreams, bloating, loose stool but sometimes hard & "I have to mash it";  She was seen by [facility] and BaSwallow showed motility disorder, smooth narrowing distally, min GE reflux;  EGD showed tort esoph, no stricture, mild distal gastritis & Bx showed benign gastric mucosa, neg HPylori, Rec to continue PPI;  We discussed the diff betw a structural abn of the GI track & a functional abn of the GI tract & suggested that the majority of her prob is the latter- Rec antireflux regimen, continue Prev30 taken 30 min before the 1st meal of the day, etc;  They would like to have DrGessner more involved in the management of her GI issues and we will request a follow up appt for her...     She has hx of GERD, Divertics, IBS, +FamHx colon ca> on Prev30, Miralax/Senakot-S, AnusolHC; last colonoscopy 2010 by DrGessner w/ divertics, hems, melanosis...    Sjogrens is treated w/ Paquenil, tears/restasis, Nystatin for her chr oral discomfort & she is followed by Rheum- DrDeveshwar...    HBP is treated w/ Metop25Bid, Diltiazem120 (decr from 180 due to [place]), Lisin20, Lasix20 prn; BP= 148/62 & she has mult somatic complaints; they stopped her Digoxin during the recent hosp due to [place]... We reviewed prob list, meds, xrays and labs> see below for updates >>  LABS 7/14:  BMet is essent wnl- continue current meds...   ~  October 24, 2012:  6wk ROV & Ariana [name] saw DrGessner 8/14 & he noted constip, then loose, chr abd discomfort, min tender RLQ w/ ventral hernia- he worried about a hi impaction & she reports Rx w/ Miralax/ Senakot-s but no help, then 1/2 gal Golytely, XRay showed ?hepatomegaly, subseq CT Abd showed liver wnl/ mod stool burden/ divertics w/o inflamm/ s/p GB & Hyst/ NAD...  Ariana [name] proceeds to describe her BMs in great detail- round balls, silly putty, "I had to mash back there" etc; she notes that DrGessner is starting a new med- Linzess145/d & hopefully  this will work...     Ariana [name] has mult somatic complaints and talks non-stop w/ flight of ideas etc; she was prev followed by Psychiatry &  doing better at those times- asked to return to [facility]...     She is c/o LBP- has Vicodin allowed up to 3/d; she recalls work up from [employer], [employer], & [employer]- shot in her back helped some...    C/o her neuropathy (on Neurontin 400Tid) and describes her prev foot problems, nail problems, etc...     She notes mult skin lesions- actinic keratoses & I tried to reassure her that these are all benign skin lesions...    She complains bitterly about her dry eyes, Sjogrens, etc; on Plaquenil 7 mult Rx by DrDeveshwar & her eye doc...           Problem List:  SJOGREN'S SYNDROME (ICD-710.2), & DRY EYE SYNDROME (ICD-375.15) - eval by DrHecker & DrDeveshwar 5/11 w/ extensive lab work done & Dx of Sjogren's syndrome & started on Pilocarpine 5mg Bid... DrHecker has her on a number of eye drops, & prev thought to be dry eyes w/ ocular rosacea & tubes placed in tear ducts... also seen by Mikey Kirschner...  ~  10/11:  DrDeveshwar started PLAQUENIL 200mg /d... Seen every 40mo by Rheum. ~  Most recent note from [facility] 4/13> Sjogren's, Raynaud's, LBP, musc spasms, on Plaquenil Rx & no changes made. ~  4/14: followed by DrDeveshwar on Plaquenil200, Tears/ Restasis, Nystatin, etc for her chronic oral discomfort & stable...  CHRONIC ORAL DISCOMFORT - this has been looked into by mult physicians and no other etiology discovered (other than her sicca syndrome)... she uses Magic Mouthwash Prn + sialogogues...  HYPERTENSION, BENIGN (ICD-401.1) >>  ~  controlled on TOPROL XL 50mg - 1/2 tabBid, DILTIAZEM CD 180mg /d, & LASIX 20mg  Qod...   ~  5/12: BP= 134/80 today, takes meds regularly & tol well... denies CP, worsening palipit, syncope, dyspnea, edema, etc... ~  11/12: BP= 122/60 & she remains asymptomatic... ~  5/13:  BP= 122/70 & she denies CP, palpit, change in SOB, edema,  etc... ~  CXR 6/13 showed stable bronchitic changes w/ thickening of airways, scarring at lung bases, no acute changes. ~  4/14: on Metop50-1/2Bid, Diltiazem180, Lisinopril20, Lasix20prn;  BP= 128/62 & she has mult symptoms and complaints w/o change. ~  7/14: post hosp visit on Metop25Bid, Diltiazem120 (decr from 180 due to [place]), Lisin20, Lasix20 prn; BP= 148/62 & she has mult somatic complaints; they stopped her Digoxin during the recent hosp due to [place].  MITRAL VALVE PROLAPSE (ICD-424.0) -  ~  Followed for yrs by drLittle- on ASA 81mg /d, & LANOXIN 0.125mg /d, his notes are reviewed> MVP, HBP, hx palpit, neg cath... ~  cath 1/05 by DrLittle showed clean coronaries & norm LVF w/ +MVP... ~  1/11:  Seen by DrLittle for f/u MVP & palpit; BP was sl elev & he rec incr Metoprolol to 25mg  Bid. ~  5/14: she had Cards f/u DrHilty> HBP, MVP, palpit- on  Dig0.125, DiltiazemER180, Lasix20Qod, Lisin20, Metop25Bid per his list; contin same, f/u 20yr. ~  7/14: post hosp visit on Metop25Bid, Diltiazem120 (decr from 180 due to [place]), Lisin20, Lasix20 prn; BP= 148/62 & she has mult somatic complaints; they stopped her Digoxin during the recent hosp due to [place].   HYPERLIPIDEMIA>  On low chol, low fat diet alone... She takes Fish Oil, flax seed oil, etc. ~  2008:  FLP showed TChol 170, TG 176, HDL 50,  ~  5/12:  FLP showed TChol 142, TG 202, HDL 62, LDL 166 ~  FLP 6/13 showed TChol 128, TG 56, HDL 61, LDL 56 ~  4/14:  on diet, FishOil & FlaxSeedOil; FLP shows TChol 166, TG 112, HDL 62, LDL 82  Elev Blood Sugar >> occurred during the 6/13 hosp & covered by SSI during the hosp & sent to rehab center w/ this==> discontinued on office follow up... ~  Labs 4/14 showed BS= 96, A1c= 5.7  MULTINODULAR THYROID >> Asymptomatic thyroid nodule ident on MRI scan of neck... ~  10/13:  Thyroid Ultrasound showed multinodular gland w/ dominant nodule measuring 4cm on left lobe... ~  11/13:  Thyroid Biopsy by IR> neg- c/w  hyperplastic nodule, no malig cells seen... ~  4/14:  Labs showed TSH= 1.33  GERD (ICD-530.81) >> ~  on PREVACID 30mg /d... denies reflux symptoms, swallowing OK x for dryness, etc... ~  last EGD 9/02 showed 4cmHH, GERD... ~  [facility] 7/14 by [facility], consult [facility]> BaSwallow showed motility disorder, smooth narrowing distally, min GE reflux;  EGD showed tort esoph, no stricture, mild distal gastritis & Bx showed benign gastric mucosa, neg HPylori, Rec to continue PPI;  We discussed the diff betw a structural abn of the GI track & a functional abn of the GI tract & suggested that the majority of her prob is the l;atter- Rec antireflux regimen, continue Prev30 taken 30 min before the 1st meal of the day, etc;  They would like to have drGessner more involved in the management of her GI issues and we will request a follow up appt for her...   DIVERTICULOSIS OF COLON (ICD-562.10),  IRRITABLE BOWEL SYNDROME (ICD-564.1), & Family Hx of COLON CANCER (ICD-153.9) - followed for GI by DrSamLeBauer- now DrGessner- colonoscopy 2/06 showed a tortuous and spastic colon, divertics, otherw neg... her sister died in her 70's w/ colon cancer... f/u colonoscopy 4/10 showed mod divertics, hems, melanosis> Rx'd w/ Miralax/ Senakot-S. ~  11/11:  she notes incr IBS symptoms- rec Rx w/ BENTYL 20mg Qid Prn, Mylicon, Phazyme ==> improved.  URINARY INCONTINENCE (ICD-788.30) - eval by GYN= DrRNeal (on Premarin 0.9mg /d)... ~  7/13:  Foley discontinued, voiding better w/ freq trips to the commode to prevent leaking... ~  4/14: prev on Trimethoprim for UTI prevention per Urology; DrDahlstedt added Myrbetriq50 for OB& she is improved.  LOW BACK PAIN SYNDROME (ICD-724.2) - on VICODIN up to 3/ day... Ortho= DrMortenson, Rendall, et al... ~  5/12:  She is c/o incr pain in right side of back/ hip/ leg> refer to Ortho for further eval; Rx Vicodin prn... ~  MRI of Cspine 10/13 showed Cx spondylosis & mild sp stenosis- referred to Neurosurg &  saw DrStern 2/14 for neck/ back/ & leg pain> his eval revealed mostly right shoulder pathology & he suggested injection, f/u w/ Rheum (seen 3/14 & note reviewed, no changes), & f/u prn w/ NS  FIBROMYALGIA (ICD-729.1) - she has chr fatigue symptoms and diffuse aches & pains... she has been eval by Rheum DrDeveshwar w/ recent dx of Sjogren's Syndrome & DDD- on CYCLOBENZAPRINE 10mg - 1/2-1 tab Prn... ~  Her GYN- DrNeal did her BMDs...  MEMORY LOSS (ICD-780.93) - eval by DrHickling for Neurology w/ MRI done- pt states "it's not dementia, just hardening of the arteries"... his note from 4/08 indicates- OBS, gait abn & neck/ Back pain diagnoses... ~  5/13:  Daughter notes more difficulty & ready to start medication ==> Aricept 5mg /d was recommended but never started by the pt... ~  6/13:  The hospitalists started [facility] 5mg Bid in lieu of the Aricept ==> memory is no better per daughter ~  7/13:  c/o headaches and  taking Vicodin as needed... ~  4/14: on Aricept5, & Namenda10Bid per DrWillis...   NEUROPATHY (ICD-355.9) - on GABAPENTIN 300mg Tid now w/ some relief; initially started on 300mg Bid per DrHickling, but she was intol w/ confusion & slurred speech... neuropathy symptoms improved on Rx. ~  6/13:  She was disch from hosp on larger dose of Neurontin 100mg - 3Tid (subseq changed to 300mg tabTid)  ANXIETY DEPRESSION (ICD-300.4) - on CYMBALTA 60mg /d & Alpraz0.5mg  as needed... prev Psychiatric Rx from DrCottle but she hasn't seen him in >2yr & refuses to return... Family requested med for depression & we tried Zoloft 25 to 50mg  /d but they stopped it due to affect on her gait & speech... Note> Klonopin in past caused hallucinations, but she tolerates the intermittent Xanax ok... ~  6/13:  She was seen by DrBogard, Psychiatrist during the 6/13 hosp & will be referred for outpt follow up... ~  Daugh has arranged for f/u appt w/ DrPlovsky => finally seen and he increased the Cymbalta to 60mg /d... ~  4/14:  prev  followed by DrPlovsky- on Cymbalta20=>60, Xanax0.5prn, & off Buspar...  Hx of ADVERSE DRUG REACTION (ICD-995.20) - hx of mult drug allergies and intolerances including:  PCN, Sulfa, Doxy, NSAIDs, Demerol, Codeine, DCN100, Tramadol, Phenergan, Thorazine, Stelazine...  NOTE: she takes Vicodin for pain, and tolerates this satis...  Health Maintenance - GYN= DrNeal on Premarin, Calcium, Vits, & Fish Oil...   Past Surgical History  Procedure Laterality Date  . Vesicovaginal fistula closure w/ tah    . Appendectomy    . Cholecystectomy    . Mandible surgery    . Temporomandibular joint surgery    . Cataract extraction, bilateral    . Abdominal hysterectomy    . Dental surgery      multiple tooth extractions  . Esophagogastroduodenoscopy (egd) with esophageal dilation N/A 08/23/2012    Procedure: ESOPHAGOGASTRODUODENOSCOPY (EGD) WITH ESOPHAGEAL DILATION;  Surgeon: Rachael Fee, MD;  Location: [facility];  Service: Endoscopy;  Laterality: N/A;    Outpatient Encounter Prescriptions as of 10/24/2012  Medication Sig Dispense Refill  . ALPRAZolam (XANAX) 0.5 MG tablet Take 1 tablet (0.5 mg total) by mouth 2 (two) times daily as needed for anxiety. For anxiety  60 tablet  5  . aspirin EC 81 MG tablet Take 81 mg by mouth daily.      . Calcium Carbonate-Vitamin D (CALTRATE 600+D) 600-400 MG-UNIT per tablet Take 2 tablets by mouth daily.        . carboxymethylcellulose (REFRESH TEARS) 0.5 % SOLN 1 drop 2 (two) times daily.        . cholecalciferol (VITAMIN D) 1000 UNITS tablet Take 1,000 Units by mouth daily.      Marland Kitchen CRANBERRY PO Take 2 capsules by mouth daily.      Marland Kitchen diltiazem (CARDIZEM CD) 120 MG 24 hr capsule TAKE ONE CAPSULE BY MOUTH EVERY DAY  30 capsule  0  . donepezil (ARICEPT) 5 MG tablet Take 5 mg by mouth at bedtime.       . DULoxetine (CYMBALTA) 30 MG capsule Take 1 capsule (30 mg total) by mouth daily.  30 capsule  3  . DULoxetine (CYMBALTA) 60 MG capsule Take 60 mg by mouth daily.       Marland Kitchen estrogens, conjugated, (PREMARIN) 0.45 MG tablet Take 0.45 mg by mouth daily. Take daily for 21 days then do not take for 7 days.      . Flaxseed, Linseed, (FLAXSEED OIL) 1000 MG CAPS Take 1 capsule by mouth  2 (two) times daily.      . folic acid (FOLVITE) 1 MG tablet TAKE 1 TABLET TWICE A DAY  60 tablet  6  . furosemide (LASIX) 20 MG tablet TAKE 1 TABLET BY MOUTH EVERY MORNING AS NEEDED FOR SWELLING  30 tablet  6  . gabapentin (NEURONTIN) 400 MG capsule Take 1 capsule (400 mg total) by mouth 3 (three) times daily.  90 capsule  5  . HYDROcodone-acetaminophen (NORCO/VICODIN) 5-325 MG per tablet Take 1 tablet three times a day as needed for pain * Max 3 tablets per day*  90 tablet  1  . hydrocortisone (ANUSOL-HC) 2.5 % rectal cream Apply to rectal area after each BM  30 g  11  . hydroxychloroquine (PLAQUENIL) 200 MG tablet Take 200 mg by mouth daily. As directed by Dr. Corliss Skains       . lansoprazole (PREVACID) 30 MG capsule TAKE ONE CAPSULE BY MOUTH EVERY DAY  30 capsule  6  . Linaclotide (LINZESS) 145 MCG CAPS capsule Take 1 capsule (145 mcg total) by mouth daily.  20 capsule  0  . lisinopril (PRINIVIL,ZESTRIL) 20 MG tablet Take 20 mg by mouth daily.      . metoprolol (LOPRESSOR) 50 MG tablet Take 25 mg by mouth 2 (two) times daily.      . mirabegron ER (MYRBETRIQ) 50 MG TB24 Take 50 mg by mouth daily.      . Multiple Vitamin (MULTIVITAMIN) tablet Take 1 tablet by mouth daily.        Marland Kitchen nystatin (MYCOSTATIN) 100000 UNIT/ML suspension 1 teaspoonful to gargle and swallow 4 times daily as needed      . Omega-3 Fatty Acids (FISH OIL) 1000 MG CAPS Take 1 capsule by mouth daily.        . pilocarpine (SALAGEN) 5 MG tablet Take 5 mg by mouth 2 (two) times daily. As directed by Dr. Corliss Skains       . polyethylene glycol (COLYTE) 240 G solution Take as directed  4000 mL  0  . polyethylene glycol (MIRALAX / GLYCOLAX) packet Take 17 g by mouth daily.       . sodium chloride (OCEAN) 0.65 % SOLN nasal spray  Place 1 spray into the nose as needed for congestion.  30 mL  0  . [DISCONTINUED] Memantine HCl ER (NAMENDA XR) 28 MG CP24 Take 1 capsule by mouth daily. Take 1/2 tablet twice daily       No facility-administered encounter medications on file as of 10/24/2012.    Allergies  Allergen Reactions  . Codeine Nausea Only    unless given with Phenergan  . Doxycycline     Unknown  . Klonopin [Clonazepam]     Causes hallucination   . Meperidine Hcl Nausea Only    unless given with Phenergan  . Naproxen   . Norflex [Orphenadrine Citrate] Nausea Only    Unless given with Phenergan  . Oxycodone-Acetaminophen Nausea Only    unless given with phenergan  . Penicillins     Unknown  . Phenothiazines     Unknown  . Propoxyphene Hcl Nausea Only    unless given with phenergan  . Stelazine     Unknown  . Sulfamethoxazole W-Trimethoprim     Unknown  . Tolectin [Tolmetin Sodium]     Unknown  . Tramadol     Unknown  . Zoloft [Sertraline Hcl]     Caused pt to sleep a lot  . Lubiprostone Rash    Current Medications, Allergies, Past  Medical History, Past Surgical History, Family History, and Social History were reviewed in [employer] record.    Review of Systems        See HPI - all other systems neg except as noted... The patient complains of decreased hearing, hoarseness, dyspnea on exertion, and abdominal pain.  The patient denies anorexia, fever, weight loss, weight gain, vision loss, chest pain, syncope, peripheral edema, prolonged cough, headaches, hemoptysis, melena, hematochezia, severe indigestion/heartburn, hematuria, incontinence, muscle weakness, suspicious skin lesions, transient blindness, difficulty walking, depression, unusual weight change, abnormal bleeding, enlarged lymph nodes, and angioedema.     Objective:   Physical Exam      WD, WN, 79 y/o WF in NAD... she has dry eyes & prominent dry mouth symptoms... GENERAL:  Alert & oriented; pleasant &  cooperative...  HEENT:  [place]/AT, EOM-wnl, PERRLA, EACs-clear, TMs-wnl, NOSE-clear, THROAT-clear & excessively dry. NECK:  Supple w/ fairROM; no JVD; normal carotid impulses w/o bruits; no thyromegaly or nodules palpated; no lymphadenopathy. CHEST:  Clear to P & A; without wheezes/ rales/ or rhonchi. HEART:  Regular Rhythm; without murmurs/ rubs/ or gallops heard... ABDOMEN:  Soft & nontender; normal bowel sounds; no organomegaly or masses detected. EXT: without deformities, mild arthritic changes; no varicose veins/ venous insuffic/ or edema. NEURO:  CN's intact;  no focal neuro deficits... DERM:  No lesions noted; no rash etc...  RADIOLOGY DATA:  Reviewed in the EPIC EMR & discussed w/ the patient...  LABORATORY DATA:  Reviewed in the EPIC EMR & discussed w/ the patient...   Assessment & Plan:    GI>  GERD, Motility disorder, ?HH, Divertics, IBS, constip, +FamHx colon ca>  on Prev30, last colon 2010- OK, Bentyl helps, they wre pleased w/ DrGessner's assessment/ eval...   SJOGREN's>  Still w/ very dry Sicca syndrome & medication from DrDeveshwar, Ophthalmology, etc...  HBP>  Controlled on meds, continue same but add regular dosing of the Lasix 20mg  Qd...  MVP>  Followed by DrLittle/Hilty for Cards, & stable on meds above...  HYPERLIPID>  TG sl elev & rec to get on better low fat diet...  UTI / Urinary Retension>  She was hosp 6/13 by [facility]- UTI resolved w/ Ab & Foley removed by Urology, now drDahlstedt on [id number] & improved...  Chronic Pain Syndrome>  She states adeq control w/ Vicodin Rx...  Memory loss/ anxiety/ depression>  Prev eval by Neuro- DrHickling, DrCottle for psyche in past;  During the 6/13 [facility]- she was seen by DrBogard for  Psyche & placed on Buspar & Cymbalta; needs outpt f/u by Psyche=> DrPlovsky...   Patient's Medications  New Prescriptions   No medications on file  Previous Medications   ALPRAZOLAM (XANAX) 0.5 MG TABLET    Take 1 tablet (0.5 mg total) by  mouth 2 (two) times daily as needed for anxiety. For anxiety   ASPIRIN EC 81 MG TABLET    Take 81 mg by mouth daily.   CALCIUM CARBONATE-VITAMIN D (CALTRATE 600+D) 600-400 MG-UNIT PER TABLET    Take 2 tablets by mouth daily.     CARBOXYMETHYLCELLULOSE (REFRESH TEARS) 0.5 % SOLN    1 drop 2 (two) times daily.     CHOLECALCIFEROL (VITAMIN D) 1000 UNITS TABLET    Take 1,000 Units by mouth daily.   CRANBERRY PO    Take 2 capsules by mouth daily.   DILTIAZEM (CARDIZEM CD) 120 MG 24 HR CAPSULE    TAKE ONE CAPSULE BY MOUTH EVERY DAY   DONEPEZIL (ARICEPT) 5 MG  TABLET    Take 5 mg by mouth at bedtime.    DULOXETINE (CYMBALTA) 30 MG CAPSULE    Take 1 capsule (30 mg total) by mouth daily.   DULOXETINE (CYMBALTA) 60 MG CAPSULE    Take 60 mg by mouth daily.   ESTROGENS, CONJUGATED, (PREMARIN) 0.45 MG TABLET    Take 0.45 mg by mouth daily. Take daily for 21 days then do not take for 7 days.   FLAXSEED, LINSEED, (FLAXSEED OIL) 1000 MG CAPS    Take 1 capsule by mouth 2 (two) times daily.   FOLIC ACID (FOLVITE) 1 MG TABLET    TAKE 1 TABLET TWICE A DAY   FUROSEMIDE (LASIX) 20 MG TABLET    TAKE 1 TABLET BY MOUTH EVERY MORNING AS NEEDED FOR SWELLING   GABAPENTIN (NEURONTIN) 400 MG CAPSULE    Take 1 capsule (400 mg total) by mouth 3 (three) times daily.   HYDROCODONE-ACETAMINOPHEN (NORCO/VICODIN) 5-325 MG PER TABLET    Take 1 tablet three times a day as needed for pain * Max 3 tablets per day*   HYDROCORTISONE (ANUSOL-HC) 2.5 % RECTAL CREAM    Apply to rectal area after each BM   HYDROXYCHLOROQUINE (PLAQUENIL) 200 MG TABLET    Take 200 mg by mouth daily. As directed by Dr. Corliss Skains    LANSOPRAZOLE (PREVACID) 30 MG CAPSULE    TAKE ONE CAPSULE BY MOUTH EVERY DAY   LINACLOTIDE (LINZESS) 145 MCG CAPS CAPSULE    Take 1 capsule (145 mcg total) by mouth daily.   LISINOPRIL (PRINIVIL,ZESTRIL) 20 MG TABLET    Take 20 mg by mouth daily.   METOPROLOL (LOPRESSOR) 50 MG TABLET    Take 25 mg by mouth 2 (two) times daily.    MIRABEGRON ER (MYRBETRIQ) 50 MG TB24    Take 50 mg by mouth daily.   MULTIPLE VITAMIN (MULTIVITAMIN) TABLET    Take 1 tablet by mouth daily.     NYSTATIN (MYCOSTATIN) 100000 UNIT/ML SUSPENSION    1 teaspoonful to gargle and swallow 4 times daily as needed   OMEGA-3 FATTY ACIDS (FISH OIL) 1000 MG CAPS    Take 1 capsule by mouth daily.     PILOCARPINE (SALAGEN) 5 MG TABLET    Take 5 mg by mouth 2 (two) times daily. As directed by Dr. Corliss Skains    POLYETHYLENE GLYCOL (COLYTE) 240 G SOLUTION    Take as directed   POLYETHYLENE GLYCOL (MIRALAX / GLYCOLAX) PACKET    Take 17 g by mouth daily.    SODIUM CHLORIDE (OCEAN) 0.65 % SOLN NASAL SPRAY    Place 1 spray into the nose as needed for congestion.  Modified Medications   No medications on file  Discontinued Medications   MEMANTINE HCL ER (NAMENDA XR) 28 MG CP24    Take 1 capsule by mouth daily. Take 1/2 tablet twice daily

## 2012-10-24 NOTE — Patient Instructions (Addendum)
Today we updated your med list in our EPIC system...    Continue your current medications the same...  Continue the treatment plan per DrGessner for your constipation...  Let's plan a follow up visit in 3 months.Marland KitchenMarland Kitchen

## 2012-10-28 ENCOUNTER — Other Ambulatory Visit: Payer: Self-pay | Admitting: Pulmonary Disease

## 2012-11-13 ENCOUNTER — Ambulatory Visit: Payer: Medicare Other | Admitting: Pulmonary Disease

## 2012-11-13 ENCOUNTER — Telehealth: Payer: Self-pay | Admitting: Internal Medicine

## 2012-11-13 NOTE — Telephone Encounter (Signed)
Patient c/o constipation and alternating bowel habits.  She will come in and see Dr. Leone Payor tomorrow at 2:45

## 2012-11-14 ENCOUNTER — Encounter: Payer: Self-pay | Admitting: Internal Medicine

## 2012-11-14 ENCOUNTER — Ambulatory Visit (INDEPENDENT_AMBULATORY_CARE_PROVIDER_SITE_OTHER): Payer: Medicare Other | Admitting: Internal Medicine

## 2012-11-14 VITALS — BP 130/70 | HR 72 | Ht 63.5 in | Wt 120.5 lb

## 2012-11-14 DIAGNOSIS — K589 Irritable bowel syndrome without diarrhea: Secondary | ICD-10-CM

## 2012-11-14 DIAGNOSIS — R32 Unspecified urinary incontinence: Secondary | ICD-10-CM

## 2012-11-14 NOTE — Progress Notes (Signed)
  Subjective:    Patient ID: Ariana [name], female    DOB: [birth year], 79 y.o.   MRN: [id number]  HPI The patient returns. She is struggled with constipation. Her bowel habits are really all over the map. I tried a purge thinking she might have a high impaction that did not help. A trial of linaclotide 145 mcg daily did not seem to help either. Her history is somewhat tangential at times, she seems very concerned about actinic keratotic like lesions on her skin. He continues to have problems are she goes to defecate and urinates and vice versa and has been able consistently stools from hard and start bolus to very loose. Medications, allergies, past medical history, past surgical history, family history and social history are reviewed and updated in the EMR. Review of Systems As above    Objective:   Physical Exam General:  NAD Eyes:   anicteric Lungs:  clear Heart:  S1S2 no rubs, murmurs or gallops Abdomen:  soft and nontender, BS+ Rectal exam with female staff present she is absent anal wink. She has no mass or rectocele there is formed brown stool. She has a decent voluntary squeeze slightly decreased resting tone I think, and appropriate abdominal contraction and rectal descent with bowel sounds. Ext:   no edema     Assessment & Plan:   1. URINARY INCONTINENCE   2. Irritable bowel syndrome    CC: [name], MD Patsi Sears, MD

## 2012-11-14 NOTE — Assessment & Plan Note (Signed)
This continues to be a problem. I think the Aricept could actually be making her bladder problems worse. I'm going to hold this temporarily and reassess her in 2 weeks.

## 2012-11-14 NOTE — Assessment & Plan Note (Signed)
I think Aricept might be worsening things here. It's very difficult to sort out her history as she does get very tangential at times. She is on a multitude of medications many of which of side effects. I think some of them are actually working against each other. I'm going to have her hold Aricept and re\re group in 2 weeks. I'm not expecting major improvement but I need to do one thing at a time. She does not seem to have pelvic floor dysfunction based upon rectal exam.

## 2012-11-14 NOTE — Patient Instructions (Addendum)
Do not take your Aricept , Dr. Leone Payor feels this may be causing some of your side effects.   Follow up with [place] in 2 weeks.  I appreciate the opportunity to care for you.

## 2012-11-16 ENCOUNTER — Other Ambulatory Visit: Payer: Self-pay | Admitting: Internal Medicine

## 2012-11-19 NOTE — Telephone Encounter (Signed)
Rx was sent to pharmacy electronically. 

## 2012-11-22 ENCOUNTER — Other Ambulatory Visit (HOSPITAL_COMMUNITY): Payer: Self-pay | Admitting: Pulmonary Disease

## 2012-11-25 ENCOUNTER — Ambulatory Visit: Payer: Medicare Other | Admitting: Neurology

## 2012-11-25 ENCOUNTER — Telehealth: Payer: Self-pay | Admitting: Neurology

## 2012-11-26 ENCOUNTER — Other Ambulatory Visit: Payer: Self-pay | Admitting: Neurology

## 2012-11-26 NOTE — Telephone Encounter (Signed)
Pt had appt today, cancel and need to reschedule.

## 2012-11-28 ENCOUNTER — Ambulatory Visit (INDEPENDENT_AMBULATORY_CARE_PROVIDER_SITE_OTHER): Payer: Medicare Other | Admitting: Internal Medicine

## 2012-11-28 ENCOUNTER — Encounter: Payer: Self-pay | Admitting: Internal Medicine

## 2012-11-28 VITALS — BP 118/52 | HR 68 | Ht 63.0 in | Wt 122.0 lb

## 2012-11-28 DIAGNOSIS — K589 Irritable bowel syndrome without diarrhea: Secondary | ICD-10-CM

## 2012-11-28 DIAGNOSIS — R32 Unspecified urinary incontinence: Secondary | ICD-10-CM

## 2012-11-28 MED ORDER — DONEPEZIL HCL 5 MG PO TABS: 5.0000 mg | ORAL_TABLET | Freq: Every day | ORAL | Status: DC

## 2012-11-28 NOTE — Assessment & Plan Note (Signed)
Not better off Aricept so restart. ? If pelvic floor PT would help  So will ask Dr. Retta Diones

## 2012-11-28 NOTE — Assessment & Plan Note (Signed)
Better it seems but overall did not feel much different off ariceptso will restart. Add Benefiber 1-3 tbsp daily Consider pelvic floor PT - ask dr. Algis Downs

## 2012-11-28 NOTE — Patient Instructions (Signed)
Please restart your Aricept.  Take benefiber, handout given.  Work your way from 1 tablespoon up to 2 and then 3 tablespoons daily.  We are going to send a note to Dr. Retta Diones to see about getting you seen by Ruben Gottron the pelvic floor specialist.  It is the time of year to have a vaccination to prevent the flu (influenza virus).  Please have this done through your primary care provider or you can get this done at local pharmacies or the [facility]. It would be very helpful if you notify your primary care provider when and where you had the vaccination given by messaging them in My Chart, leaving a message or faxing the information.   I appreciate the opportunity to care for you.

## 2012-11-28 NOTE — Progress Notes (Signed)
  Subjective:    Patient ID: Ariana [name], female    DOB: 1930-01-09, 79 y.o.   MRN: [id number]  HPI Here w/ daughter Moving bowels more regularly with frequent smaller hard stools. She gets urge to defecate with urge to urinate. Still has some urinary leakage. She did not feel much different off Aricept. Appears calmer today. Has to press in rectal area to aid defecation  Medications, allergies, past medical history, past surgical history, family history and social history are reviewed and updated in the EMR.  Review of Systems Buttock pain when sitting - ischial tuberosities    Objective:   Physical Exam Elderly, NAD    Assessment & Plan:  Irritable bowel syndrome  URINARY INCONTINENCE

## 2012-11-29 ENCOUNTER — Encounter (INDEPENDENT_AMBULATORY_CARE_PROVIDER_SITE_OTHER): Payer: Self-pay

## 2012-11-29 ENCOUNTER — Encounter: Payer: Self-pay | Admitting: Neurology

## 2012-11-29 ENCOUNTER — Ambulatory Visit (INDEPENDENT_AMBULATORY_CARE_PROVIDER_SITE_OTHER): Payer: Medicare Other | Admitting: Neurology

## 2012-11-29 VITALS — BP 96/50 | HR 58 | Wt 123.0 lb

## 2012-11-29 DIAGNOSIS — G589 Mononeuropathy, unspecified: Secondary | ICD-10-CM

## 2012-11-29 DIAGNOSIS — F0390 Unspecified dementia without behavioral disturbance: Secondary | ICD-10-CM

## 2012-11-29 DIAGNOSIS — R51 Headache: Secondary | ICD-10-CM

## 2012-11-29 NOTE — Progress Notes (Signed)
Reason for visit: Memory disorder  Ariana [name] is an 79 y.o. female  History of present illness:  Ariana [name] is an 79 year old right-handed white female with a history of a mild memory disturbance. The patient was on Aricept, but she was having some diarrhea. The patient was taken off of this medication, but it never helped her bowel issues. The patient continues to complain of numbness in both feet, and some pain going up the legs above the knee on the right. The patient walks with a cane, and she denies any falls since last seen. The patient does have some low back pain. The patient returns to this office for further evaluation.  Past Medical History  Diagnosis Date  . Sjogren's syndrome   . Dry eye syndrome   . Hypertension, benign   . Mitral valve prolapse   . GERD (gastroesophageal reflux disease)   . Diverticulosis of colon   . Irritable bowel syndrome   . Urinary incontinence   . Low back pain syndrome   . Fibromyalgia   . Memory loss   . Anxiety and depression   . History of adverse drug reaction   . Peripheral neuropathy     "both feet and legs"  . Shortness of breath 07/18/11    "alot lately"  . Anginal pain   . History of recurrent UTIs   . H/O hiatal hernia   . Anxiety   . Dementia   . Depression   . Abnormality of gait   . Thyroid nodule   . Headache(784.0) 09/05/2012  . Adenomatous polyp of colon 2002    7mm    Past Surgical History  Procedure Laterality Date  . Vesicovaginal fistula closure w/ tah    . Appendectomy    . Cholecystectomy    . Mandible surgery    . Temporomandibular joint surgery    . Cataract extraction, bilateral    . Abdominal hysterectomy    . Dental surgery      multiple tooth extractions  . Esophagogastroduodenoscopy (egd) with esophageal dilation N/A 08/23/2012    Procedure: ESOPHAGOGASTRODUODENOSCOPY (EGD) WITH ESOPHAGEAL DILATION;  Surgeon: Rachael Fee, MD;  Location: [facility];  Service: Endoscopy;  Laterality: N/A;   . Colonoscopy w/ biopsies      multiple    Family History  Problem Relation Age of Onset  . Colon cancer Sister   . Heart disease Father   . Pneumonia Mother   . Heart attack Mother   . Throat cancer Brother     Social history:  reports that she has quit smoking. She has never used smokeless tobacco. She reports that she does not drink alcohol or use illicit drugs.    Allergies  Allergen Reactions  . Codeine Nausea Only    unless given with Phenergan  . Doxycycline     Unknown  . Klonopin [Clonazepam]     Causes hallucination   . Meperidine Hcl Nausea Only    unless given with Phenergan  . Naproxen   . Norflex [Orphenadrine Citrate] Nausea Only    Unless given with Phenergan  . Oxycodone-Acetaminophen Nausea Only    unless given with phenergan  . Penicillins     Unknown  . Phenothiazines     Unknown  . Propoxyphene Hcl Nausea Only    unless given with phenergan  . Stelazine     Unknown  . Sulfamethoxazole-Trimethoprim     Unknown  . Tolectin [Tolmetin Sodium]     Unknown  .  Tramadol     Unknown  . Zoloft [Sertraline Hcl]     Caused pt to sleep a lot  . Lubiprostone Rash    Medications:  Current Outpatient Prescriptions on File Prior to Visit  Medication Sig Dispense Refill  . ALPRAZolam (XANAX) 0.5 MG tablet Take 1 tablet (0.5 mg total) by mouth 2 (two) times daily as needed for anxiety. For anxiety  60 tablet  5  . aspirin EC 81 MG tablet Take 81 mg by mouth daily.      . Calcium Carbonate-Vitamin D (CALTRATE 600+D) 600-400 MG-UNIT per tablet Take 2 tablets by mouth daily.        . carboxymethylcellulose (REFRESH TEARS) 0.5 % SOLN 1 drop 2 (two) times daily.        . cholecalciferol (VITAMIN D) 1000 UNITS tablet Take 1,000 Units by mouth daily.      Marland Kitchen CRANBERRY PO Take 2 capsules by mouth daily.      Marland Kitchen diltiazem (CARDIZEM CD) 120 MG 24 hr capsule TAKE ONE CAPSULE BY MOUTH EVERY DAY  30 capsule  5  . donepezil (ARICEPT) 5 MG tablet Take 1 tablet (5 mg  total) by mouth at bedtime.      . DULoxetine (CYMBALTA) 30 MG capsule Take 1 capsule (30 mg total) by mouth daily.  30 capsule  3  . DULoxetine (CYMBALTA) 60 MG capsule Take 60 mg by mouth daily.      Marland Kitchen estrogens, conjugated, (PREMARIN) 0.45 MG tablet Take 0.45 mg by mouth daily. Take daily for 21 days then do not take for 7 days.      . Flaxseed, Linseed, (FLAXSEED OIL) 1000 MG CAPS Take 1 capsule by mouth 2 (two) times daily.      . folic acid (FOLVITE) 1 MG tablet TAKE 1 TABLET TWICE A DAY  60 tablet  6  . furosemide (LASIX) 20 MG tablet TAKE 1 TABLET BY MOUTH EVERY MORNING AS NEEDED FOR SWELLING  30 tablet  6  . gabapentin (NEURONTIN) 400 MG capsule TAKE ONE CAPSULE BY MOUTH 3 TIMES A DAY  90 capsule  3  . HYDROcodone-acetaminophen (NORCO/VICODIN) 5-325 MG per tablet TAKE 1 TABLET 3 TIMES A DAY AS NEEDED FOR PAIN MAX 3 PER DAY  90 tablet  1  . hydrocortisone (ANUSOL-HC) 2.5 % rectal cream Apply to rectal area after each BM  30 g  11  . hydroxychloroquine (PLAQUENIL) 200 MG tablet Take 200 mg by mouth daily. As directed by Dr. Corliss Skains       . lansoprazole (PREVACID) 30 MG capsule TAKE ONE CAPSULE BY MOUTH EVERY DAY  30 capsule  6  . lisinopril (PRINIVIL,ZESTRIL) 20 MG tablet Take 20 mg by mouth daily.      . metoprolol (LOPRESSOR) 50 MG tablet TAKE 1/2 TABLET BY MOUTH TWICE A DAY  30 tablet  7  . mirabegron ER (MYRBETRIQ) 50 MG TB24 Take 50 mg by mouth daily.      . Multiple Vitamin (MULTIVITAMIN) tablet Take 1 tablet by mouth daily.        Marland Kitchen nystatin (MYCOSTATIN) 100000 UNIT/ML suspension 1 teaspoonful to gargle and swallow 4 times daily as needed      . Omega-3 Fatty Acids (FISH OIL) 1000 MG CAPS Take 1 capsule by mouth daily.        . pilocarpine (SALAGEN) 5 MG tablet Take 5 mg by mouth 2 (two) times daily. As directed by Dr. Corliss Skains       . polyethylene glycol (MIRALAX /  GLYCOLAX) packet Take 17 g by mouth daily.       . sodium chloride (OCEAN) 0.65 % SOLN nasal spray Place 1 spray  into the nose as needed for congestion.  30 mL  0   No current facility-administered medications on file prior to visit.    ROS:  Out of a complete 14 system review of symptoms, the patient complains only of the following symptoms, and all other reviewed systems are negative.  Palpitations of the heart, swelling in the legs Eye pain Incontinence of bowel, diarrhea, constipation Incontinence of bladder Easy bruising, easy bleeding Feeling hot, cold Achy muscles Runny nose Memory loss, headache, numbness Insomnia, decreased energy  Blood pressure 96/50, pulse 58, weight 123 lb (55.792 kg).  Physical Exam  General: The patient is alert and cooperative at the time of the examination.  Skin: No significant peripheral edema is noted.   Neurologic Exam  Mental status: Mini-Mental status examination done today shows a total score of 30 out of 30.  Cranial nerves: Facial symmetry is present. Speech is normal, no aphasia or dysarthria is noted. Extraocular movements are full. Visual fields are full.  Motor: The patient has good strength in all 4 extremities.  Coordination: The patient has good finger-nose-finger and heel-to-shin bilaterally.  Gait and station: The patient has a slightly wide-based gait. The patient uses a cane for ambulation. Tandem gait is slightly unsteady. Romberg is negative. No drift is seen.  Reflexes: Deep tendon reflexes are symmetric, but are depressed.   Assessment/Plan:  1. Mild memory disturbance  2. Gait disturbance  3. Bilateral foot numbness, lower extremity discomfort  The patient indicates a history of a peripheral neuropathy, but I see no prior documentation of this. The patient will undergo nerve conduction studies of both legs, and an EMG of the right leg. The patient may require further evaluation with scanning of the low back depending upon the results of the above. The patient followup with EMG.  Marlan Palau MD 11/30/2012 12:44  PM  [facility] [address] [place], [place] [address]  Phone [phone] Fax [phone]

## 2012-12-05 ENCOUNTER — Encounter (INDEPENDENT_AMBULATORY_CARE_PROVIDER_SITE_OTHER): Payer: Self-pay | Admitting: Radiology

## 2012-12-05 ENCOUNTER — Ambulatory Visit (INDEPENDENT_AMBULATORY_CARE_PROVIDER_SITE_OTHER): Payer: Medicare Other | Admitting: Neurology

## 2012-12-05 DIAGNOSIS — R269 Unspecified abnormalities of gait and mobility: Secondary | ICD-10-CM

## 2012-12-05 DIAGNOSIS — G589 Mononeuropathy, unspecified: Secondary | ICD-10-CM

## 2012-12-05 DIAGNOSIS — Z0289 Encounter for other administrative examinations: Secondary | ICD-10-CM

## 2012-12-05 DIAGNOSIS — D518 Other vitamin B12 deficiency anemias: Secondary | ICD-10-CM

## 2012-12-05 DIAGNOSIS — G63 Polyneuropathy in diseases classified elsewhere: Secondary | ICD-10-CM

## 2012-12-05 NOTE — Progress Notes (Signed)
Ariana [name] is an 79 year old patient who has a history of a gait disturbance, numbness in the feet, and some discomfort down the right leg from the hip to the foot. The patient comes into the office today for EMG and nerve conduction study evaluation.  Nerve conduction studies done on the right arm and both legs show evidence of a primarily axonal peripheral neuropathy of moderate severity. EMG evaluation of the right lower extremity shows primarily distal denervation consistent with the history of peripheral neuropathy. No evidence of an overlying lumbosacral radiculopathy is seen.  Blood work will be done to evaluate for the peripheral neuropathy. The patient will followup in 6 months.

## 2012-12-05 NOTE — Procedures (Signed)
  HISTORY:  Ariana [name] is an 79 year old patient with a history of a gait disorder. The patient reports some discomfort in the back, and pain radiating down the right leg to the knee. The patient is being evaluated for a possible peripheral neuropathy or a lumbosacral radiculopathy. The patient has numbness in the feet.  NERVE CONDUCTION STUDIES:  Nerve conduction studies were performed on right upper extremity. The distal motor latencies and motor amplitudes for the median and ulnar nerves were within normal limits. The F wave latencies and nerve conduction velocities for these nerves were also normal. The sensory latencies for the median and ulnar nerves were normal.  Nerve conduction studies were performed on both lower extremities. The distal motor latency for the right peroneal nerve was normal, with a low motor amplitude. No response was seen for the left peroneal nerve. The distal motor latencies for the posterior tibial nerves were normal bilaterally, with low motor amplitudes for these nerves bilaterally. Nerve conduction velocities for the right peroneal nerve and for the posterior tibial nerves bilaterally were within normal limits. The H reflex latencies were absent bilaterally, and the peroneal sensory latencies were absent bilaterally.  EMG STUDIES:  EMG study was performed on the right lower extremity:  The tibialis anterior muscle reveals 2 to 5K motor units with decreased recruitment. No fibrillations or positive waves were seen. The peroneus tertius muscle reveals 2 to 6K motor units with decreased recruitment. No fibrillations or positive waves were seen. The medial gastrocnemius muscle reveals 2 to 4K motor units with decreased recruitment. No fibrillations or positive waves were seen. The vastus lateralis muscle reveals 2 to 4K motor units with full recruitment. No fibrillations or positive waves were seen. The iliopsoas muscle reveals 2 to 4K motor units with full  recruitment. No fibrillations or positive waves were seen. The biceps femoris muscle (long head) reveals 2 to 4K motor units with full recruitment. No fibrillations or positive waves were seen. The lumbosacral paraspinal muscles were tested at 3 levels, and revealed no abnormalities of insertional activity at all 3 levels tested. There was good relaxation.   IMPRESSION:  Nerve conduction studies done on the right upper extremity and both lower extremities shows evidence of a primarily axonal peripheral neuropathy of moderate severity. EMG evaluation of the right lower extremity shows distal chronic stable signs of denervation consistent with the diagnosis of peripheral neuropathy. There is no clear evidence of an overlying lumbosacral radiculopathy on the right.  Marlan Palau MD 12/05/2012 2:02 PM  [facility] [address] [place], [place] [address]  Phone [phone] Fax [phone]

## 2012-12-09 LAB — IFE AND PE, SERUM
Alpha 1: 0.2 g/dL (ref 0.1–0.4)
Alpha2 Glob SerPl Elph-Mcnc: 0.5 g/dL (ref 0.4–1.2)
B-Globulin SerPl Elph-Mcnc: 0.8 g/dL (ref 0.6–1.3)
Gamma Glob SerPl Elph-Mcnc: 1.3 g/dL (ref 0.5–1.6)
Globulin, Total: 2.8 g/dL (ref 2.0–4.5)
IgG (Immunoglobin G), Serum: 1368 mg/dL (ref 700–1600)
Total Protein: 6.6 g/dL (ref 6.0–8.5)

## 2012-12-09 LAB — ENA+DNA/DS+SJORGEN'S
ENA RNP Ab: >8 AI — ABNORMAL HIGH (ref 0.0–0.9)
ENA SM Ab Ser-aCnc: <0.2 AI (ref 0.0–0.9)
ENA SSA (RO) Ab: <0.2 AI (ref 0.0–0.9)
ENA SSB (LA) Ab: 0.2 AI (ref 0.0–0.9)
dsDNA Ab: 1 IU/mL (ref 0–9)

## 2012-12-09 LAB — ANGIOTENSIN CONVERTING ENZYME: Angio Convert Enzyme: 15 U/L (ref 14–82)

## 2012-12-09 LAB — VITAMIN B12: Vitamin B-12: 780 pg/mL (ref 211–946)

## 2012-12-09 LAB — ANA W/REFLEX: Anti Nuclear Antibody(ANA): POSITIVE — AB

## 2012-12-09 NOTE — Telephone Encounter (Signed)
I called the patient and I talked with the daughter. The blood work was unremarkable with exception that the RNP antibody was elevated. The clinical significance of this is not clear. The patient had EMG and nerve conduction studies consistent with a peripheral neuropathy. I discussed this with the daughter. We will follow the blood work over time.

## 2012-12-12 ENCOUNTER — Telehealth: Payer: Self-pay | Admitting: Pulmonary Disease

## 2012-12-12 MED ORDER — HYDROCODONE-ACETAMINOPHEN 5-325 MG PO TABS: ORAL_TABLET | ORAL | Status: DC

## 2012-12-12 NOTE — Telephone Encounter (Signed)
rx has been printed out and placed on SN cart to be signed.  Will call pts daughter once this has been done for her to pick up.

## 2012-12-12 NOTE — Telephone Encounter (Signed)
Called and spoke with pts daughter and she is aware of rx that has been left up front and is ready to be picked up. She stated that this will be picked up tomorrow.

## 2012-12-13 ENCOUNTER — Other Ambulatory Visit: Payer: Self-pay | Admitting: Internal Medicine

## 2012-12-13 ENCOUNTER — Telehealth: Payer: Self-pay | Admitting: Pulmonary Disease

## 2012-12-13 ENCOUNTER — Other Ambulatory Visit: Payer: Self-pay | Admitting: Pulmonary Disease

## 2012-12-13 MED ORDER — NYSTATIN 100000 UNIT/ML MT SUSP: OROMUCOSAL | Status: DC

## 2012-12-13 MED ORDER — HYDROCODONE-ACETAMINOPHEN 5-325 MG PO TABS: ORAL_TABLET | ORAL | Status: DC

## 2012-12-13 NOTE — Telephone Encounter (Signed)
rx could not be located.  Reprinted rx for the vicodin and signed by SN.  Given to pts care giver.

## 2012-12-13 NOTE — Telephone Encounter (Signed)
SN please advise if we can refill the nystatin for the pt?  Daughter called back and requested a refill of this medication today.  Please advise. Thanks  Allergies  Allergen Reactions  . Codeine Nausea Only    unless given with Phenergan  . Doxycycline     Unknown  . Klonopin [Clonazepam]     Causes hallucination   . Meperidine Hcl Nausea Only    unless given with Phenergan  . Naproxen   . Norflex [Orphenadrine Citrate] Nausea Only    Unless given with Phenergan  . Oxycodone-Acetaminophen Nausea Only    unless given with phenergan  . Penicillins     Unknown  . Phenothiazines     Unknown  . Propoxyphene Hcl Nausea Only    unless given with phenergan  . Stelazine     Unknown  . Sulfamethoxazole-Trimethoprim     Unknown  . Tolectin [Tolmetin Sodium]     Unknown  . Tramadol     Unknown  . Zoloft [Sertraline Hcl]     Caused pt to sleep a lot  . Lubiprostone Rash

## 2012-12-13 NOTE — Telephone Encounter (Signed)
rx has been printed out and given to the pts daughter.  Nothing further is needed.

## 2012-12-13 NOTE — Telephone Encounter (Signed)
Rx was sent to pharmacy electronically. 

## 2013-01-03 ENCOUNTER — Other Ambulatory Visit: Payer: Self-pay | Admitting: Neurology

## 2013-01-06 ENCOUNTER — Other Ambulatory Visit: Payer: Self-pay | Admitting: Pulmonary Disease

## 2013-01-24 ENCOUNTER — Telehealth: Payer: Self-pay | Admitting: Pulmonary Disease

## 2013-01-24 MED ORDER — HYDROCODONE-ACETAMINOPHEN 5-325 MG PO TABS: ORAL_TABLET | ORAL | Status: DC

## 2013-01-24 NOTE — Telephone Encounter (Signed)
Norco last refilled 12/13/12 #90 x 0 refills. Please advise SN thanks

## 2013-01-24 NOTE — Telephone Encounter (Signed)
Per SN-okay to refill #90. Cyniah(caller) aware that Rx is at front for pick up-will be picked up by caregiver on Monday.

## 2013-01-31 ENCOUNTER — Other Ambulatory Visit: Payer: Self-pay | Admitting: Pulmonary Disease

## 2013-02-02 ENCOUNTER — Other Ambulatory Visit: Payer: Self-pay | Admitting: Neurology

## 2013-02-04 ENCOUNTER — Ambulatory Visit (INDEPENDENT_AMBULATORY_CARE_PROVIDER_SITE_OTHER)
Admission: RE | Admit: 2013-02-04 | Discharge: 2013-02-04 | Disposition: A | Discharge status: Home / Self Care | Payer: Medicare Other | Source: Ambulatory Visit | Attending: Pulmonary Disease | Admitting: Pulmonary Disease

## 2013-02-04 ENCOUNTER — Ambulatory Visit (INDEPENDENT_AMBULATORY_CARE_PROVIDER_SITE_OTHER): Payer: Medicare Other | Admitting: Pulmonary Disease

## 2013-02-04 ENCOUNTER — Encounter: Payer: Self-pay | Admitting: Pulmonary Disease

## 2013-02-04 VITALS — BP 112/76 | HR 55 | Temp 97.6°F | Ht 63.5 in | Wt 124.2 lb

## 2013-02-04 DIAGNOSIS — R071 Chest pain on breathing: Secondary | ICD-10-CM

## 2013-02-04 DIAGNOSIS — R32 Unspecified urinary incontinence: Secondary | ICD-10-CM

## 2013-02-04 DIAGNOSIS — Z23 Encounter for immunization: Secondary | ICD-10-CM

## 2013-02-04 DIAGNOSIS — R0789 Other chest pain: Secondary | ICD-10-CM

## 2013-02-04 DIAGNOSIS — F341 Dysthymic disorder: Secondary | ICD-10-CM

## 2013-02-04 DIAGNOSIS — M545 Low back pain, unspecified: Secondary | ICD-10-CM

## 2013-02-04 DIAGNOSIS — G589 Mononeuropathy, unspecified: Secondary | ICD-10-CM

## 2013-02-04 DIAGNOSIS — I10 Essential (primary) hypertension: Secondary | ICD-10-CM

## 2013-02-04 DIAGNOSIS — L309 Dermatitis, unspecified: Secondary | ICD-10-CM

## 2013-02-04 DIAGNOSIS — E042 Nontoxic multinodular goiter: Secondary | ICD-10-CM

## 2013-02-04 DIAGNOSIS — R413 Other amnesia: Secondary | ICD-10-CM

## 2013-02-04 DIAGNOSIS — M3500 Sicca syndrome, unspecified: Secondary | ICD-10-CM

## 2013-02-04 DIAGNOSIS — W1809XA Striking against other object with subsequent fall, initial encounter: Secondary | ICD-10-CM

## 2013-02-04 DIAGNOSIS — IMO0001 Reserved for inherently not codable concepts without codable children: Secondary | ICD-10-CM

## 2013-02-04 DIAGNOSIS — I059 Rheumatic mitral valve disease, unspecified: Secondary | ICD-10-CM

## 2013-02-04 DIAGNOSIS — K219 Gastro-esophageal reflux disease without esophagitis: Secondary | ICD-10-CM

## 2013-02-04 DIAGNOSIS — K589 Irritable bowel syndrome without diarrhea: Secondary | ICD-10-CM

## 2013-02-04 NOTE — Patient Instructions (Signed)
Today we updated your med list in our EPIC system...    Continue your current medications the same...  Today we did a follow up CXR & right rib detail films...    We will contact you w/ the results when available...   For the right chest lateral wall pain>>    REST THE CHEST...    Apply heat & you may alternate w/ ICE if this makes it feel better...    Put on the Rib Binder to decrease chest wall motion & pain...    Use the Vicodin (Hydrocodone) w/ Tylenol as needed...  Please be careful- NO FALLING!!!  Call for any questions.Marland KitchenMarland Kitchen

## 2013-02-04 NOTE — Progress Notes (Signed)
Subjective:    Patient ID: Ariana [name], female    DOB: 17-Feb-1929, 79 y.o.   MRN: [id number]  HPI 79 y/o WF here for a follow up visit... he has multiple medical problems as noted below...   ~  Jul 14, 2011:  37mo ROV & Ariana [name] is here w/ her daughter who is her caregiver; there is much conflict w/ pt being argumentative & set in her ways, resisting help from daugh, etc; especially regarding her meds> daughter notes several meds caused reactions- staggering, hallucinations, confusion (eg- Zoloft, Klonopin); pt takes Xanax as needed (admin by family) & she is really angry about not getting more pain medicine when she wants it; we discussed these issues but she has early dementia & doesn't "get-it", daugh requested Alz med & we will start DONEPIZIL 5mg /d for 59mo the 10mg /d thereafter...  Pt spent an inordinate amt of time ranting about her teeth, dentures, implants, TMJ, her severe dry mouth, etc...    We reviewed prob list, meds, xrays and labs> see below>> LABS 5/13:  Chems- ok w/ BS=105 Na=132;  CBC- wnl;  Fe=123 (30%sat);  TSH=1.56; Urine- +UTI ==> we will Rx w/ Cipro250Bid...  ~  August 02, 2011:  3wk [place] & post hospital visit>  She was Adm 6/4 - 07/25/11 by [facility] w/ UTI & urinary retension, altered mental status, & her mult other medical problems;  Her urine was abn but no growth on cult & she improved w/ Ceftin;  She had a foley placed but this was not discontinued at disch to the rehab facility- she was sent w/ it & has had f/u w/ Urology for removal;  She had just been started on Aricept (but hadn't yet taken her 1st pill) & was switched to Namenda 5mg Bid;  She was seen by Psychiatry DrBogard and started on Buspar & Cymbalta- but they do not see outpts so we will have to refer to an office based Psychiatrist for on-going help;  She was also disch on SSI but her sugars were only minimally elev & A1c=5.7, and we will stop the insulin & monitor sugars on diet alone...      We reviewed prob list, meds,  xrays and labs> see below>> CXR 6/13 showed atx & scarring LLL, NAD... CT Brain 6/13 showed cortical atrophy, NAD... CT Abd 6/13 showed distented bladder, constip, s/p hyst, left renal cyst, diverticulosis & some wall thickening in transverse colon... EKG 6/13 showed NSR, rate74, LAD, minor NSSTTWA... LABS 6/13:  CBC- Hg=11-12, WBC=6-7K;  Sed=16;  Chems- ok x elev BS (90-160), A1c=5.7;  FLP- at goals;  TFTs= wnl...  ~  August 31, 2011:  59mo ROV & Ariana [name] feels like she's a new person- improving, getting therapy, not talking to herself anymore; unfortunately she has been unable to see an outpt psychiatrist & asking social services at [facility] for help... Daughter has been helpful trying to get her the psychiatric care she needs (recall that she saw Neuro- DrReynolds (Dementia) & Psyche- DrBogard (started on Buspar & Cymbalta) during her recent hosp)...    We reviewed prob list, meds, xrays and labs> see below see below >>  ~  November 03, 2011:  22mo ROV & daugh is frustrated w/ pt (dementia, behavioral issues) & difficulty arranging for outpt psychiatric care- she had appt w/ DrPlovsky but it's been moved out twice... Pt is now at home but actually liked being at [facility] & their rehab program "I had to leave"; she & daugh seem to  be at odds esp re pt's dementia; since ret home she has fallen w/ pain in her tailbone & we discussed mentioning this to DrDeveshwar for poss XRays & to see what could possibly be done for that; they request refill of her Gabapentin 300mg Tid...  She notes several ecchymoses "THEY ARE NOT BRUISES" she emphasizes;  BP has been intermittently elev at home since disch from the NH & measures 142/70; furthermore they note intermittent swelling in ankles & we discussed compromise by adding LASIX 20mg  Qod...    She has a f/u appt coming up w/ DrHilty at [facility] (DrLittle has retired)...    She saw Urology 6/13> bladder distention w/ poor emptying; she was disch w/ foley, & had that  eventually removed w/ improved voiding & no signif PVR...     We reviewed prob list, meds, xrays and labs> see below for updates >> ADDENDUM:  ThyroidSonar 11/13> dominant left thyroid nodule biopsied ==> c/w hyperplastic nodule, no malig cells ident...  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    HBP> on Metop50-1/2Bid, Diltiazem180, Lasix20Qod, & DrHilty added Lisinopril20;  BP= 124/72 & she has mult symptoms and complaints...    MVP, CWP, Palpit> on ASA81, Lanoxin.125; followed by DrHilty now that DrLittle has retired- seen 11/13 & note reviewed...    Chol> on diet, FishOil & FlaxSeedOil; last FLP in Epic was 6/13 w/ TChol 128, TG 56, HDL 61, LDL 56    Multinod Goiter> w/ Bx of dom 4cm left lobe nodule= hyperplastic;  Thyroid function has been wnl w/ TSH 6/13= 1.16 (normal FreeT3 & FreeT4).    GI- GERD, Divertics, IBS, +FamHx colon ca> on Prev30, Bentyl20, Miralax/Senakot-S, AnusolHC; last colonoscopy 2010 by DrGessner w/ divertics, hems, melanosis...    Urinary incont> on Trimethoprim for UTI prevention per Urology...    FM, LBP, fall w/ fx coccyx (ER 10/13)> MRI of Cspine 10/13 showedCx spondylosis & mild sp stenosis- referred to Neurosurg & eval pending...    Dementia> on Namenda10Bid per DrWillis...    Neuropathy> on Gabapentin 300Tid w/ sl improvement in her symptoms...    Anxiety, Depression> followed by DrPlovsky- on Cymbalta60, Buspar10Tid, Xanax0.5prn... We reviewed prob list, meds, xrays and labs> see below for updates >>   ~  May 16, 2012:  58mo ROV & Ariana [name] says "I ain't doin" & she notes incr swelling=> rec to incr Lasix20 from prn to one  daily;  She has persistent mult somatic complaints & daugh wonders about her depression, on low dose Cymbalta, & we decided to incr to 60mg /d... We reviewed the following medical problems during today's office visit >>     Sjogren's syndrome> followed by DrDeveshwar on Plaquenil200, + tears/ Restasis, Nystatin, etc for her chronic oral discomfort...    HBP> on Metop50-1/2Bid, Diltiazem180, Lisinopril20, Lasix20prn;  BP= 128/62 & she has mult symptoms and complaints w/o change...    MVP, CWP, Palpit> on ASA81, Lanoxin.125; followed by DrHilty now that DrLittle has retired- seen 11/13 & note reviewed...    Chol> on diet, FishOil & FlaxSeedOil; FLP shows TChol 166, TG 112, HDL 62, LDL 82    Multinod Goiter> w/ Bx of  dom 4cm left lobe nodule= hyperplastic;  Thyroid function has been wnl w/ TSH = 1.33 (normal FreeT3 & FreeT4).    GI- GERD, Divertics, IBS, +FamHx colon ca> on Prev30, Miralax/Senakot-S, AnusolHC; last colonoscopy 2010 by DrGessner w/ divertics, hems, melanosis...    Urinary incont> prev on Trimethoprim for UTI prevention per Urology; DrDahlstedt added Myrbetriq50 & she is improved...    FM, LBP, fall w/ fx coccyx (ER 10/13)> MRI of Cspine 10/13 showed Cx spondylosis & mild sp stenosis- referred to Neurosurg & saw DrStern 2/14 for neck/ back/ & leg pain> his eval revealed mostly right shoulder pathology & he suggested injection, f/u w/ Rheum (seen 3/14 & note reviewed, no changes), & f/u prn w/ NS...    Dementia> on Aricept5, & Namenda10Bid per DrWillis...    Neuropathy> on Gabapentin 300Tid w/ sl improvement in her symptoms...    Anxiety, Depression> prev followed by DrPlovsky- on Cymbalta20=>60, Xanax0.5prn, & off Buspar We reviewed prob list, meds, xrays and labs> see below for updates >>  LABS 4/14:  FLP- at goals on diet rx;  Chems- wnl;  CBC- wnl;  TSH=1.33;  VitD=52;  A1c=5.7.Marland KitchenMarland Kitchen  ~  September 06, 2012:  36mo ROV & post hosp visit> Hosp 7/8 - 08/23/12 by [facility] for lower sternal/epig pain,  palpit, ?n/v- but by her hx=> "hurt so bad", couldn't sleep, crazy dreams, bloating, loose stool but sometimes hard & "I have to mash it";  She was seen by [facility] and BaSwallow showed motility disorder, smooth narrowing distally, min GE reflux;  EGD showed tort esoph, no stricture, mild distal gastritis & Bx showed benign gastric mucosa, neg HPylori, Rec to continue PPI;  We discussed the diff betw a structural abn of the GI track & a functional abn of the GI tract & suggested that the majority of her prob is the latter- Rec antireflux regimen, continue Prev30 taken 30 min before the 1st meal of the day, etc;  They would like to have DrGessner more involved in the management of her GI issues and we will request a follow up appt for her...     She has hx of GERD, Divertics, IBS, +FamHx colon ca> on Prev30, Miralax/Senakot-S, AnusolHC; last colonoscopy 2010 by DrGessner w/ divertics, hems, melanosis...    Sjogrens is treated w/ Paquenil, tears/restasis, Nystatin for her chr oral discomfort & she is followed by Rheum- DrDeveshwar...    HBP is treated w/ Metop25Bid, Diltiazem120 (decr from 180 due to [place]), Lisin20, Lasix20 prn; BP= 148/62 & she has mult somatic complaints; they stopped her Digoxin during the recent hosp due to [place]... We reviewed prob list, meds, xrays and labs> see below for updates >>  LABS 7/14:  BMet is essent wnl- continue current meds...   ~  October 24, 2012:  6wk ROV & Janyiah saw DrGessner 8/14 & he noted constip, then loose, chr abd discomfort, min tender RLQ w/ ventral hernia- he worried about a hi impaction & she reports Rx w/ Miralax/ Senakot-s but no help, then 1/2 gal Golytely, XRay showed ?hepatomegaly, subseq CT Abd showed liver wnl/ mod stool burden/ divertics w/o inflamm/ s/p GB & Hyst/ NAD...  Tamla proceeds to describe her BMs in great detail- round balls, silly putty, "I had to mash back there" etc; she notes that DrGessner is starting a new med- Linzess145/d & hopefully  this will work...     Mckinsley has mult somatic complaints and talks non-stop w/ flight of ideas etc; she was prev followed by Psychiatry &  doing better at those times- asked to return to [facility] et al...     She is c/o LBP- has Vicodin allowed up to 3/d; she recalls work up from [employer], [employer], & [employer]- shot in her back helped some...    C/o her neuropathy (on Neurontin 400Tid) and describes her prev foot problems, nail problems, etc...     She notes mult skin lesions- actinic keratoses & I tried to reassure her that these are all benign skin lesions...    She complains bitterly about her dry eyes, Sjogrens, etc; on Plaquenil 7 mult Rx by DrDeveshwar & her eye doc...  ~  February 04, 2013:  9mo ROV & Hiba fell 2 nights ago, hit her back on end-table w/ some bruising & pain, we discussed CXR & rib detail films (both neg, NAD); she persists w/ mult somatic complaints rambling history w/ numerous subspecialty evals> see above notes... GI bowel problems sl better off Aricept per DrGessner;  She has had thorough Neuro eval from DrWillis- mild memory disturbance and periph neuropathy w/ gait abn...  She indicates that DrGessner sent her to Urology "to strengthen my pelvis" she says & she is now on Myrbetriq50...     BP remains controlled on Metop25Bid, Cardizem"CD120, Lisin20, Lasix20 prn swelling; BP= 112/76 today & she appears stable...     On Prevacid, Miralax, AnusolHC & GI is stable as well...     She remains on Xanax, Cymbalta, Aricept5 per our list but she did not bring med bottles to the office today... We reviewed prob list, meds, xrays and labs> see below for updates >> she declines the 2014 Flu vaccine; Daughter gives her meds & she has Viciodin prn pain...  CXR 12/14 showed norm heart size, COPD, clear lungs/ NAD, DJD in Tspine...  Rib detail films neg for fx...            Problem List:  SJOGREN'S SYNDROME (ICD-710.2), & DRY EYE SYNDROME (ICD-375.15) - eval by DrHecker & DrDeveshwar  5/11 w/ extensive lab work done & Dx of Sjogren's syndrome & started on Pilocarpine 5mg Bid... DrHecker has her on a number of eye drops, & prev thought to be dry eyes w/ ocular rosacea & tubes placed in tear ducts... also seen by Mikey Kirschner...  ~  10/11:  DrDeveshwar started PLAQUENIL 200mg /d... Seen every 50mo by Rheum. ~  Most recent [facility] from [facility] 4/13> Sjogren's, Raynaud's, LBP, musc spasms, on Plaquenil Rx & no changes made. ~  4/14: followed by DrDeveshwar on Plaquenil200, Tears/ Restasis, Nystatin, etc for her chronic oral discomfort & stable...  CHRONIC ORAL DISCOMFORT - this has been looked into by mult physicians and no other etiology discovered (other than her sicca syndrome)... she uses Magic Mouthwash Prn + sialogogues...  HYPERTENSION, BENIGN (ICD-401.1) >>  ~  controlled on TOPROL XL 50mg - 1/2 tabBid, DILTIAZEM CD 180mg /d, & LASIX 20mg  Qod...   ~  5/12: BP= 134/80 today, takes meds regularly & tol well... denies CP, worsening palipit, syncope, dyspnea, edema, etc... ~  11/12: BP= 122/60 & she remains asymptomatic... ~  5/13:  BP= 122/70 & she denies CP, palpit, change in SOB, edema, etc... ~  CXR 6/13 showed stable bronchitic changes w/ thickening of airways, scarring at lung bases, no acute changes. ~  4/14: on Metop50-1/2Bid, Diltiazem180, Lisinopril20, Lasix20prn;  BP= 128/62 & she has mult symptoms and complaints w/o change. ~  7/14: post hosp visit on Metop25Bid, Diltiazem120 (decr from 180 due to [place]), Lisin20, Lasix20 prn; BP= 148/62 &  she has mult somatic complaints; they stopped her Digoxin during the recent hosp due to [place]. ~  CXR 12/14 showed norm heart size, COPD, clear lungs/ NAD, DJD in Tspine;  BP= 112/76 & she appears stable...  MITRAL VALVE PROLAPSE (ICD-424.0) -  ~  Followed for yrs by drLittle- on ASA 81mg /d, & LANOXIN 0.125mg /d, his notes are reviewed> MVP, HBP, hx palpit, neg cath... ~  cath 1/05 by DrLittle showed clean coronaries & norm LVF w/  +MVP... ~  1/11:  Seen by DrLittle for f/u MVP & palpit; BP was sl elev & he rec incr Metoprolol to 25mg  Bid. ~  5/14: she had Cards f/u DrHilty> HBP, MVP, palpit- on  Dig0.125, DiltiazemER180, Lasix20Qod, Lisin20, Metop25Bid per his list; contin same, f/u 11yr. ~  7/14: post hosp visit on Metop25Bid, Diltiazem120 (decr from 180 due to [place]), Lisin20, Lasix20 prn; BP= 148/62 & she has mult somatic complaints; they stopped her Digoxin during the recent hosp due to [place].  ~  EKG 7/14 showed NSR, rate 61, 1st degree AVB & IRBBB, otherw wnl...  HYPERLIPIDEMIA>  On low chol, low fat diet alone... She takes Fish Oil, flax seed oil, etc. ~  2008:  FLP showed TChol 170, TG 176, HDL 50,  ~  5/12:  FLP showed TChol 142, TG 202, HDL 62, LDL 166 ~  FLP 6/13 showed TChol 128, TG 56, HDL 61, LDL 56 ~  4/14: on diet, FishOil & FlaxSeedOil; FLP shows TChol 166, TG 112, HDL 62, LDL 82  Elev Blood Sugar >> occurred during the 6/13 hosp & covered by SSI during the hosp & sent to rehab center w/ this==> discontinued on office follow up... ~  Labs 4/14 showed BS= 96, A1c= 5.7  MULTINODULAR THYROID >> Asymptomatic thyroid nodule ident on MRI scan of neck... ~  10/13:  Thyroid Ultrasound showed multinodular gland w/ dominant nodule measuring 4cm on left lobe... ~  11/13:  Thyroid Biopsy by IR> neg- c/w hyperplastic nodule, no malig cells seen... ~  4/14:  Labs showed TSH= 1.33  GERD (ICD-530.81) >> ~  on PREVACID 30mg /d... denies reflux symptoms, swallowing OK x for dryness, etc... ~  last EGD 9/02 showed 4cmHH, GERD... ~  [facility] 7/14 by [facility], consult [facility]GI> BaSwallow showed motility disorder, smooth narrowing distally, min GE reflux;  EGD showed tort esoph, no stricture, mild distal gastritis & Bx showed benign gastric mucosa, neg HPylori, Rec to continue PPI;  We discussed the diff betw a structural abn of the GI track & a functional abn of the GI tract & suggested that the majority of her prob is the l;atter-  Rec antireflux regimen, continue Prev30 taken 30 min before the 1st meal of the day, etc;  They would like to have drGessner more involved in the management of her GI issues and we will request a follow up appt for her...   DIVERTICULOSIS OF COLON (ICD-562.10),  IRRITABLE BOWEL SYNDROME (ICD-564.1), & Family Hx of COLON CANCER (ICD-153.9) - followed for GI by DrSamLeBauer- now DrGessner- colonoscopy 2/06 showed a tortuous and spastic colon, divertics, otherw neg... her sister died in her 62's w/ colon cancer... f/u colonoscopy 4/10 showed mod divertics, hems, melanosis> Rx'd w/ Miralax/ Senakot-S. ~  11/11:  she notes incr IBS symptoms- rec Rx w/ BENTYL 20mg Qid Prn, Mylicon, Phazyme ==> improved. ~  CT Abd 9/14 showed s/p GB w/ prom bile ducts (stable)liver wnl, tiny left renal cysts, s/p hyst, adnexa wnl & no adenopathy, sigm divertics, no inflamm,  mod stool burden...   URINARY INCONTINENCE (ICD-788.30) - eval by GYN= DrRNeal (on Premarin 0.9mg /d)... ~  7/13:  Foley discontinued, voiding better w/ freq trips to the commode to prevent leaking... ~  4/14: prev on Trimethoprim for UTI prevention per Urology; DrDahlstedt added Myrbetriq50 for OB& she is improved.  LOW BACK PAIN SYNDROME (ICD-724.2) - on VICODIN up to 3/ day... Ortho= DrMortenson, Rendall, et al... ~  5/12:  She is c/o incr pain in right side of back/ hip/ leg> refer to Ortho for further eval; Rx Vicodin prn... ~  MRI of Cspine 10/13 showed Cx spondylosis & mild sp stenosis- referred to Neurosurg & saw DrStern 2/14 for neck/ back/ & leg pain> his eval revealed mostly right shoulder pathology & he suggested injection, f/u w/ Rheum (seen 3/14 & note reviewed, no changes), & f/u prn w/ NS  FIBROMYALGIA (ICD-729.1) - she has chr fatigue symptoms and diffuse aches & pains... she has been eval by Rheum DrDeveshwar w/ recent dx of Sjogren's Syndrome & DDD- on CYCLOBENZAPRINE 10mg - 1/2-1 tab Prn... ~  Her GYN- DrNeal did her BMDs...  MEMORY  LOSS (ICD-780.93) - eval by DrHickling for Neurology w/ MRI done- pt states "it's not dementia, just hardening of the arteries"... his note from 4/08 indicates- OBS, gait abn & neck/ Back pain diagnoses... ~  5/13:  Daughter notes more difficulty & ready to start medication ==> Aricept 5mg /d was recommended but never started by the pt... ~  6/13:  The hospitalists started [facility] 5mg Bid in lieu of the Aricept ==> memory is no better per daughter ~  7/13:  c/o headaches and taking Vicodin as needed... ~  4/14: on Aricept5, & Namenda10Bid per DrWillis...  ~  CT Head 7/14 showed post traumatic deformity of right mid-face w/ prev repair of right zygoma & atelec of right max sinus, + for cerebral atrophy & sm vessel dis, NAD.Marland Kitchen. ~  CDopplers 7/14 by [facility] showed wnl w/o any signif plaque or stenoses... ~  10/14: seen by DrWillis for memory eval & EMG/NCV> gait disorder, back pain, neuropathy; + for mod periph neuropathy, no signs of radiculopathy...  NEUROPATHY (ICD-355.9) - on GABAPENTIN 300mg Tid now w/ some relief; initially started on 300mg Bid per DrHickling, but she was intol w/ confusion & slurred speech... neuropathy symptoms improved on Rx. ~  6/13:  She was disch from hosp on larger dose of Neurontin 100mg - 3Tid (subseq changed to 300mg tabTid)  ANXIETY DEPRESSION (ICD-300.4) - on CYMBALTA 60mg /d & Alpraz0.5mg  as needed... prev Psychiatric Rx from DrCottle but she hasn't seen him in >70yr & refuses to return... Family requested med for depression & we tried Zoloft 25 to 50mg  /d but they stopped it due to affect on her gait & speech... Note> Klonopin in past caused hallucinations, but she tolerates the intermittent Xanax ok... ~  6/13:  She was seen by DrBogard, Psychiatrist during the 6/13 hosp & will be referred for outpt follow up... ~  Daugh has arranged for f/u appt w/ DrPlovsky => finally seen and he increased the Cymbalta to 60mg /d... ~  4/14:  prev followed by DrPlovsky- on  Cymbalta20=>60, Xanax0.5prn, & off Buspar...  Hx of ADVERSE DRUG REACTION (ICD-995.20) - hx of mult drug allergies and intolerances including:  PCN, Sulfa, Doxy, NSAIDs, Demerol, Codeine, DCN100, Tramadol, Phenergan, Thorazine, Stelazine...  NOTE: she takes Vicodin for pain, and tolerates this satis...  Health Maintenance - GYN= DrNeal on Premarin, Calcium, Vits, & Fish Oil...   Past Surgical History  Procedure Laterality  Date  . Vesicovaginal fistula closure w/ tah    . Appendectomy    . Cholecystectomy    . Mandible surgery    . Temporomandibular joint surgery    . Cataract extraction, bilateral    . Abdominal hysterectomy    . Dental surgery      multiple tooth extractions  . Esophagogastroduodenoscopy (egd) with esophageal dilation N/A 08/23/2012    Procedure: ESOPHAGOGASTRODUODENOSCOPY (EGD) WITH ESOPHAGEAL DILATION;  Surgeon: Rachael Fee, MD;  Location: [facility];  Service: Endoscopy;  Laterality: N/A;  . Colonoscopy w/ biopsies      multiple    Outpatient Encounter Prescriptions as of 02/04/2013  Medication Sig  . ALPRAZolam (XANAX) 0.5 MG tablet Take 1 tablet (0.5 mg total) by mouth 2 (two) times daily as needed for anxiety. For anxiety  . aspirin EC 81 MG tablet Take 81 mg by mouth daily.  . Calcium Carbonate-Vitamin D (CALTRATE 600+D) 600-400 MG-UNIT per tablet Take 2 tablets by mouth daily.    . carboxymethylcellulose (REFRESH TEARS) 0.5 % SOLN 1 drop 2 (two) times daily.    . cholecalciferol (VITAMIN D) 1000 UNITS tablet Take 1,000 Units by mouth daily.  Marland Kitchen CRANBERRY PO Take 2 capsules by mouth daily.  Marland Kitchen diltiazem (CARDIZEM CD) 120 MG 24 hr capsule TAKE ONE CAPSULE BY MOUTH EVERY DAY  . donepezil (ARICEPT) 5 MG tablet Take 1 tablet (5 mg total) by mouth at bedtime.  . DULoxetine (CYMBALTA) 30 MG capsule TAKE ONE CAPSULE BY MOUTH EVERY DAY  . DULoxetine (CYMBALTA) 60 MG capsule Take 60 mg by mouth daily.  Marland Kitchen estrogens, conjugated, (PREMARIN) 0.45 MG tablet Take 0.45  mg by mouth daily. Take daily for 21 days then do not take for 7 days.  . Flaxseed, Linseed, (FLAXSEED OIL) 1000 MG CAPS Take 1 capsule by mouth 2 (two) times daily.  . folic acid (FOLVITE) 1 MG tablet TAKE 1 TABLET TWICE A DAY  . furosemide (LASIX) 20 MG tablet TAKE 1 TABLET BY MOUTH EVERY MORNING AS NEEDED FOR SWELLING  . gabapentin (NEURONTIN) 400 MG capsule TAKE ONE CAPSULE BY MOUTH 3 TIMES A DAY  . HYDROcodone-acetaminophen (NORCO/VICODIN) 5-325 MG per tablet TAKE 1 TABLET 3 TIMES A DAY AS NEEDED FOR PAIN MAX 3 PER DAY  . hydrocortisone (ANUSOL-HC) 2.5 % rectal cream Apply to rectal area after each BM  . hydroxychloroquine (PLAQUENIL) 200 MG tablet Take 200 mg by mouth daily. As directed by Dr. Corliss Skains   . lansoprazole (PREVACID) 30 MG capsule TAKE ONE CAPSULE BY MOUTH EVERY DAY  . lisinopril (PRINIVIL,ZESTRIL) 20 MG tablet TAKE 1 TABLET BY MOUTH EVERY DAY  . metoprolol (LOPRESSOR) 50 MG tablet TAKE 1/2 TABLET BY MOUTH TWICE A DAY  . mirabegron ER (MYRBETRIQ) 50 MG TB24 Take 50 mg by mouth daily.  . Multiple Vitamin (MULTIVITAMIN) tablet Take 1 tablet by mouth daily.    Marland Kitchen nystatin (MYCOSTATIN) 100000 UNIT/ML suspension 1 teaspoonful to gargle and swallow 4 times daily as needed  . Omega-3 Fatty Acids (FISH OIL) 1000 MG CAPS Take 1 capsule by mouth daily.    . pilocarpine (SALAGEN) 5 MG tablet Take 5 mg by mouth 2 (two) times daily. As directed by Dr. Corliss Skains   . polyethylene glycol (MIRALAX / GLYCOLAX) packet Take 17 g by mouth daily.   . sodium chloride (OCEAN) 0.65 % SOLN nasal spray Place 1 spray into the nose as needed for congestion.    Allergies  Allergen Reactions  . Codeine Nausea Only  unless given with Phenergan  . Doxycycline     Unknown  . Klonopin [Clonazepam]     Causes hallucination   . Meperidine Hcl Nausea Only    unless given with Phenergan  . Naproxen   . Norflex [Orphenadrine Citrate] Nausea Only    Unless given with Phenergan  .  Oxycodone-Acetaminophen Nausea Only    unless given with phenergan  . Penicillins     Unknown  . Phenothiazines     Unknown  . Propoxyphene Hcl Nausea Only    unless given with phenergan  . Stelazine     Unknown  . Sulfamethoxazole-Trimethoprim     Unknown  . Tolectin [Tolmetin Sodium]     Unknown  . Tramadol     Unknown  . Zoloft [Sertraline Hcl]     Caused pt to sleep a lot  . Lubiprostone Rash    Current Medications, Allergies, Past Medical History, Past Surgical History, Family History, and Social History were reviewed in [employer] record.    Review of Systems        See HPI - all other systems neg except as noted... The patient complains of decreased hearing, hoarseness, dyspnea on exertion, and abdominal pain.  The patient denies anorexia, fever, weight loss, weight gain, vision loss, chest pain, syncope, peripheral edema, prolonged cough, headaches, hemoptysis, melena, hematochezia, severe indigestion/heartburn, hematuria, incontinence, muscle weakness, suspicious skin lesions, transient blindness, difficulty walking, depression, unusual weight change, abnormal bleeding, enlarged lymph nodes, and angioedema.     Objective:   Physical Exam      WD, WN, 79 y/o WF in NAD... she has dry eyes & prominent dry mouth symptoms... GENERAL:  Alert & oriented; pleasant & cooperative...  HEENT:  [place]/AT, EOM-wnl, PERRLA, EACs-clear, TMs-wnl, NOSE-clear, THROAT-clear & excessively dry. NECK:  Supple w/ fairROM; no JVD; normal carotid impulses w/o bruits; no thyromegaly or nodules palpated; no lymphadenopathy. CHEST:  Clear to P & A; without wheezes/ rales/ or rhonchi. HEART:  Regular Rhythm; without murmurs/ rubs/ or gallops heard... ABDOMEN:  Soft & nontender; normal bowel sounds; no organomegaly or masses detected. EXT: without deformities, mild arthritic changes; no varicose veins/ venous insuffic/ or edema. NEURO:  CN's intact;  no focal neuro  deficits... DERM:  No lesions noted; no rash etc...  RADIOLOGY DATA:  Reviewed in the EPIC EMR & discussed w/ the patient...  LABORATORY DATA:  Reviewed in the EPIC EMR & discussed w/ the patient...   Assessment & Plan:    SJOGREN's>  Still w/ very dry Sicca syndrome & medication from DrDeveshwar, Ophthalmology, etc...  HBP>  Controlled on meds, continue same but add regular dosing of the Lasix 20mg  Qd...  MVP>  Followed by DrLittle/Hilty for Cards, & stable on meds above...  HYPERLIPID>  TG sl elev & rec to get on better low fat diet...  GI>  GERD, Motility disorder, ?HH, Divertics, IBS, constip, +FamHx colon ca>  on Prev30, last colon 2010- OK, Bentyl helps, they wre pleased w/ DrGessner's assessment/ eval...  UTI / Urinary Retension>  She was hosp 6/13 by [facility]- UTI resolved w/ Ab & Foley removed by Urology, now drDahlstedt on [id number] & improved...  Chronic Pain Syndrome>  She states adeq control w/ Vicodin Rx... She sees Ecologist as noted...  Peripheral Neuropathy>  eval by DrWillis w/ abn EMG/NCV; on Neurontin, Vicodin, numerous supplements...  Memory loss/ anxiety/ depression>  Prev eval by Neuro- DrHickling, DrCottle for psyche in past;  During the 6/13 [facility]- she was seen  by DrBogard for  Psyche & placed on Buspar & Cymbalta; needs outpt f/u by Psyche=> DrPlovsky...   Patient's Medications  New Prescriptions   No medications on file  Previous Medications   ALPRAZOLAM (XANAX) 0.5 MG TABLET    Take 1 tablet (0.5 mg total) by mouth 2 (two) times daily as needed for anxiety. For anxiety   ASPIRIN EC 81 MG TABLET    Take 81 mg by mouth daily.   CALCIUM CARBONATE-VITAMIN D (CALTRATE 600+D) 600-400 MG-UNIT PER TABLET    Take 2 tablets by mouth daily.     CARBOXYMETHYLCELLULOSE (REFRESH TEARS) 0.5 % SOLN    1 drop 2 (two) times daily.     CHOLECALCIFEROL (VITAMIN D) 1000 UNITS TABLET    Take 1,000 Units by mouth daily.   CRANBERRY PO    Take 2 capsules by mouth daily.    DILTIAZEM (CARDIZEM CD) 120 MG 24 HR CAPSULE    TAKE ONE CAPSULE BY MOUTH EVERY DAY   DONEPEZIL (ARICEPT) 5 MG TABLET    Take 1 tablet (5 mg total) by mouth at bedtime.   DULOXETINE (CYMBALTA) 30 MG CAPSULE    TAKE ONE CAPSULE BY MOUTH EVERY DAY   DULOXETINE (CYMBALTA) 60 MG CAPSULE    Take 60 mg by mouth daily.   ESTROGENS, CONJUGATED, (PREMARIN) 0.45 MG TABLET    Take 0.45 mg by mouth daily. Take daily for 21 days then do not take for 7 days.   FLAXSEED, LINSEED, (FLAXSEED OIL) 1000 MG CAPS    Take 1 capsule by mouth 2 (two) times daily.   FOLIC ACID (FOLVITE) 1 MG TABLET    TAKE 1 TABLET TWICE A DAY   FUROSEMIDE (LASIX) 20 MG TABLET    TAKE 1 TABLET BY MOUTH EVERY MORNING AS NEEDED FOR SWELLING   GABAPENTIN (NEURONTIN) 400 MG CAPSULE    TAKE ONE CAPSULE BY MOUTH 3 TIMES A DAY   HYDROCODONE-ACETAMINOPHEN (NORCO/VICODIN) 5-325 MG PER TABLET    TAKE 1 TABLET 3 TIMES A DAY AS NEEDED FOR PAIN MAX 3 PER DAY   HYDROCORTISONE (ANUSOL-HC) 2.5 % RECTAL CREAM    Apply to rectal area after each BM   HYDROXYCHLOROQUINE (PLAQUENIL) 200 MG TABLET    Take 200 mg by mouth daily. As directed by Dr. Corliss Skains    LANSOPRAZOLE (PREVACID) 30 MG CAPSULE    TAKE ONE CAPSULE BY MOUTH EVERY DAY   LISINOPRIL (PRINIVIL,ZESTRIL) 20 MG TABLET    TAKE 1 TABLET BY MOUTH EVERY DAY   METOPROLOL (LOPRESSOR) 50 MG TABLET    TAKE 1/2 TABLET BY MOUTH TWICE A DAY   MIRABEGRON ER (MYRBETRIQ) 50 MG TB24    Take 50 mg by mouth daily.   MULTIPLE VITAMIN (MULTIVITAMIN) TABLET    Take 1 tablet by mouth daily.     NYSTATIN (MYCOSTATIN) 100000 UNIT/ML SUSPENSION    1 teaspoonful to gargle and swallow 4 times daily as needed   OMEGA-3 FATTY ACIDS (FISH OIL) 1000 MG CAPS    Take 1 capsule by mouth daily.     PILOCARPINE (SALAGEN) 5 MG TABLET    Take 5 mg by mouth 2 (two) times daily. As directed by Dr. Corliss Skains    POLYETHYLENE GLYCOL [facility] / Ethelene Hal) PACKET    Take 17 g by mouth daily.    SODIUM CHLORIDE (OCEAN) 0.65 % SOLN NASAL SPRAY     Place 1 spray into the nose as needed for congestion.  Modified Medications   No medications on file  Discontinued Medications   No medications on file

## 2013-02-19 ENCOUNTER — Telehealth: Payer: Self-pay | Admitting: Pulmonary Disease

## 2013-02-19 DIAGNOSIS — E042 Nontoxic multinodular goiter: Secondary | ICD-10-CM

## 2013-02-19 NOTE — Telephone Encounter (Signed)
Pt stated that she saw Dr. Nori Riis on Monday and was told that her thyroid felt like it was swollen.  Pt feels that she may need another scan done on her thyroid.  Last thyroid biopsy was 11/2011.  Pt was calling to see if SN will order this for her.  SN please advise. Thanks  Allergies  Allergen Reactions  . Codeine Nausea Only    unless given with Phenergan  . Doxycycline     Unknown  . Klonopin [Clonazepam]     Causes hallucination   . Meperidine Hcl Nausea Only    unless given with Phenergan  . Naproxen   . Norflex [Orphenadrine Citrate] Nausea Only    Unless given with Phenergan  . Oxycodone-Acetaminophen Nausea Only    unless given with phenergan  . Penicillins     Unknown  . Phenothiazines     Unknown  . Propoxyphene Hcl Nausea Only    unless given with phenergan  . Stelazine     Unknown  . Sulfamethoxazole-Trimethoprim     Unknown  . Tolectin [Tolmetin Sodium]     Unknown  . Tramadol     Unknown  . Zoloft [Sertraline Hcl]     Caused pt to sleep a lot  . Lubiprostone Rash     Current Outpatient Prescriptions on File Prior to Visit  Medication Sig Dispense Refill  . ALPRAZolam (XANAX) 0.5 MG tablet Take 1 tablet (0.5 mg total) by mouth 2 (two) times daily as needed for anxiety. For anxiety  60 tablet  5  . aspirin EC 81 MG tablet Take 81 mg by mouth daily.      . Calcium Carbonate-Vitamin D (CALTRATE 600+D) 600-400 MG-UNIT per tablet Take 2 tablets by mouth daily.        . carboxymethylcellulose (REFRESH TEARS) 0.5 % SOLN 1 drop 2 (two) times daily.        . cholecalciferol (VITAMIN D) 1000 UNITS tablet Take 1,000 Units by mouth daily.      Marland Kitchen CRANBERRY PO Take 2 capsules by mouth daily.      Marland Kitchen diltiazem (CARDIZEM CD) 120 MG 24 hr capsule TAKE ONE CAPSULE BY MOUTH EVERY DAY  30 capsule  5  . donepezil (ARICEPT) 5 MG tablet Take 1 tablet (5 mg total) by mouth at bedtime.      . DULoxetine (CYMBALTA) 30 MG capsule TAKE ONE CAPSULE BY MOUTH EVERY DAY  30 capsule  5  .  DULoxetine (CYMBALTA) 60 MG capsule Take 60 mg by mouth daily.      Marland Kitchen estrogens, conjugated, (PREMARIN) 0.45 MG tablet Take 0.45 mg by mouth daily. Take daily for 21 days then do not take for 7 days.      . Flaxseed, Linseed, (FLAXSEED OIL) 1000 MG CAPS Take 1 capsule by mouth 2 (two) times daily.      . folic acid (FOLVITE) 1 MG tablet TAKE 1 TABLET TWICE A DAY  60 tablet  6  . furosemide (LASIX) 20 MG tablet TAKE 1 TABLET BY MOUTH EVERY MORNING AS NEEDED FOR SWELLING  30 tablet  6  . gabapentin (NEURONTIN) 400 MG capsule TAKE ONE CAPSULE BY MOUTH 3 TIMES A DAY  90 capsule  3  . HYDROcodone-acetaminophen (NORCO/VICODIN) 5-325 MG per tablet TAKE 1 TABLET 3 TIMES A DAY AS NEEDED FOR PAIN MAX 3 PER DAY  90 tablet  0  . hydrocortisone (ANUSOL-HC) 2.5 % rectal cream Apply to rectal area after each BM  30 g  11  .  hydroxychloroquine (PLAQUENIL) 200 MG tablet Take 200 mg by mouth daily. As directed by Dr. Estanislado Pandy       . lansoprazole (PREVACID) 30 MG capsule TAKE ONE CAPSULE BY MOUTH EVERY DAY  30 capsule  6  . lisinopril (PRINIVIL,ZESTRIL) 20 MG tablet TAKE 1 TABLET BY MOUTH EVERY DAY  30 tablet  6  . metoprolol (LOPRESSOR) 50 MG tablet TAKE 1/2 TABLET BY MOUTH TWICE A DAY  30 tablet  7  . mirabegron ER (MYRBETRIQ) 50 MG TB24 Take 50 mg by mouth daily.      . Multiple Vitamin (MULTIVITAMIN) tablet Take 1 tablet by mouth daily.        Marland Kitchen nystatin (MYCOSTATIN) 100000 UNIT/ML suspension 1 teaspoonful to gargle and swallow 4 times daily as needed  180 mL  5  . Omega-3 Fatty Acids (FISH OIL) 1000 MG CAPS Take 1 capsule by mouth daily.        . pilocarpine (SALAGEN) 5 MG tablet Take 5 mg by mouth 2 (two) times daily. As directed by Dr. Estanislado Pandy       . polyethylene glycol (MIRALAX / GLYCOLAX) packet Take 17 g by mouth daily.       . sodium chloride (OCEAN) 0.65 % SOLN nasal spray Place 1 spray into the nose as needed for congestion.  30 mL  0   No current facility-administered medications on file prior to  visit.

## 2013-02-19 NOTE — Telephone Encounter (Signed)
lmomtcb  

## 2013-02-20 NOTE — Telephone Encounter (Signed)
Returning call.

## 2013-02-20 NOTE — Telephone Encounter (Signed)
Per SN: okay to schedule for thyroid US.   Called and made pt aware. Order has been placed. Nothing further needed

## 2013-02-27 ENCOUNTER — Ambulatory Visit (HOSPITAL_COMMUNITY)
Admission: RE | Admit: 2013-02-27 | Discharge: 2013-02-27 | Disposition: A | Discharge status: Home / Self Care | Payer: Medicare Other | Source: Ambulatory Visit | Attending: Pulmonary Disease | Admitting: Pulmonary Disease

## 2013-02-27 DIAGNOSIS — E079 Disorder of thyroid, unspecified: Secondary | ICD-10-CM | POA: Insufficient documentation

## 2013-02-27 DIAGNOSIS — E042 Nontoxic multinodular goiter: Secondary | ICD-10-CM | POA: Insufficient documentation

## 2013-02-28 ENCOUNTER — Telehealth: Payer: Self-pay | Admitting: Pulmonary Disease

## 2013-02-28 MED ORDER — LORAZEPAM 0.5 MG PO TABS: ORAL_TABLET | ORAL | Status: DC

## 2013-02-28 NOTE — Telephone Encounter (Signed)
Spoke with daughter. She has paper that has the covered alternatives and will call back with the names

## 2013-02-28 NOTE — Telephone Encounter (Signed)
Per SN---  Ok to change to lorazepam 0.5 mg  #90  With 5 refills.  1/2 to 1 tablet by mouth three times daily as needed for nerves.  i called and spoke with pts daughter and she is aware of change in medication.  i spoke with SN since there was an override that was needed for this medication and ok per SN since these meds are all in the same family.  Med list has been updated and med has been called to the pharmacy.

## 2013-02-28 NOTE — Telephone Encounter (Signed)
I spoke with Marcie Bal and she states the alternatives to alprazolam are clorazepate, diazepam, or lorazepam. Please advise if any of these will work for the pt? Hurley Bing, CMA Allergies  Allergen Reactions  . Codeine Nausea Only    unless given with Phenergan  . Doxycycline     Unknown  . Klonopin [Clonazepam]     Causes hallucination   . Meperidine Hcl Nausea Only    unless given with Phenergan  . Naproxen   . Norflex [Orphenadrine Citrate] Nausea Only    Unless given with Phenergan  . Oxycodone-Acetaminophen Nausea Only    unless given with phenergan  . Penicillins     Unknown  . Phenothiazines     Unknown  . Propoxyphene Hcl Nausea Only    unless given with phenergan  . Stelazine     Unknown  . Sulfamethoxazole-Trimethoprim     Unknown  . Tolectin [Tolmetin Sodium]     Unknown  . Tramadol     Unknown  . Zoloft [Sertraline Hcl]     Caused pt to sleep a lot  . Lubiprostone Rash

## 2013-03-05 ENCOUNTER — Telehealth: Payer: Self-pay | Admitting: Pulmonary Disease

## 2013-03-05 DIAGNOSIS — E042 Nontoxic multinodular goiter: Secondary | ICD-10-CM

## 2013-03-05 NOTE — Telephone Encounter (Signed)
Per SN--- We can place on the order that the pt is requesting to have Dr. Reesa Chew do the biopsy.    i spoke with pts daughter and she stated that pt is now hoarding/buying many things from the home shopping shows on tv.  Pt has ordered TV bundle at her home and is not able to afford all of these things.  Daughter stated that her therapist told her that SN would have to say that the pt is not competent to care for herself.  pts daughter asked to make SN aware and wanted recs from SN.     Spoke with SN and he stated that this dx would have to come from a psychiatrist.

## 2013-03-06 NOTE — Telephone Encounter (Signed)
Called and spoke with pts daughter and she is aware that the orders have been placed for the biopsy.  Daughter is aware that she will have to follow up with psychiatrist for further eval of issues that are going on.

## 2013-03-07 ENCOUNTER — Telehealth: Payer: Self-pay | Admitting: Pulmonary Disease

## 2013-03-07 ENCOUNTER — Ambulatory Visit: Payer: Medicare Other | Admitting: Nurse Practitioner

## 2013-03-07 MED ORDER — ALPRAZOLAM 0.5 MG PO TABS: 0.5000 mg | ORAL_TABLET | Freq: Two times a day (BID) | ORAL | Status: DC | PRN

## 2013-03-07 MED ORDER — HYDROCODONE-ACETAMINOPHEN 5-325 MG PO TABS: ORAL_TABLET | ORAL | Status: DC

## 2013-03-07 NOTE — Telephone Encounter (Signed)
Spoke with pharmacist and advised alprazolam should be 1 bid prn  This is how we have been sending it  Nothing further needed

## 2013-03-07 NOTE — Telephone Encounter (Signed)
Called and spoke with daughter. Pt needs a refill on her norco. Also she reports that she is being told by the pharmacy the lorazepam needs a PA as well. She reports she was told this was an alternative to the alprazolam so she does not understand why this is not covered.   I called [facility] and spoke with [facility]. It looks like they were trying to run an expired RX and reason they were getting a rejection  Claim. Pt lorazepam per cliff does need a PA. He did a test claim for new RX for pt alprazolam and it went through and stated it was covered. Since pt has not taken/picked up the lorazepam we have cancelled this RX.   I called and daughter aware. She will pick norco RX on Monday. I made her aware of what happened with the alprazolam RX. She will call back if she has any problems. Nothing further needed

## 2013-03-20 ENCOUNTER — Telehealth: Payer: Self-pay | Admitting: *Deleted

## 2013-03-20 NOTE — Telephone Encounter (Signed)
Received a fax for PA and this was initiated through [place] but the incorrect form was completed, so we received a fax advising that we needed to contact the plan directly at [phone]. I called and initiated PA, form faxed. Will await fax. Sharon Bing, CMA

## 2013-03-20 NOTE — Telephone Encounter (Signed)
Form received, completed and placed on SN cart to sign. [place] Bing, CMA

## 2013-03-26 NOTE — Telephone Encounter (Signed)
Called [employer] at [phone] medication has been approved from 03-21-2013 x 1 year.

## 2013-03-28 ENCOUNTER — Other Ambulatory Visit: Payer: Self-pay | Admitting: Neurology

## 2013-04-08 ENCOUNTER — Inpatient Hospital Stay
Admission: RE | Admit: 2013-04-08 | Discharge: 2013-04-08 | Disposition: A | Discharge status: Home / Self Care | Payer: Medicare Other | Source: Ambulatory Visit | Attending: Pulmonary Disease | Admitting: Pulmonary Disease

## 2013-04-14 ENCOUNTER — Telehealth: Payer: Self-pay | Admitting: Pulmonary Disease

## 2013-04-14 NOTE — Telephone Encounter (Signed)
Called and spoke with pts daughter and she stated that the pt has been hostile, angry and aggressive towards family and caregiver.  Daughter checked her urine over the weekend and it was postitve for nitrates and leucocytes.  She called urology and they scheduled her an appt for this morning.  Daughter stated that the caregiver took the pt to the appt as far as she knows.  i advised her to make sure the pt went to the appt with urology and that they checked her urine.    She stated that Dr. Casimiro Needle had increased the cymbalta for the pt and she called last week and left a message to see if this could be caused by this.  She stated that she has not heard back from them.  i advised her to call that office back and let them know what is going on.  She stated that she will do this.  Nothing further is needed.

## 2013-04-20 ENCOUNTER — Ambulatory Visit (HOSPITAL_COMMUNITY)
Admission: RE | Admit: 2013-04-20 | Discharge: 2013-04-20 | Disposition: A | Discharge status: Home / Self Care | Payer: Medicare Other | Source: Ambulatory Visit | Attending: Family Medicine | Admitting: Family Medicine

## 2013-04-20 ENCOUNTER — Ambulatory Visit (INDEPENDENT_AMBULATORY_CARE_PROVIDER_SITE_OTHER): Payer: Medicare Other | Admitting: Family Medicine

## 2013-04-20 VITALS — BP 112/62 | HR 72 | Temp 97.7°F | Resp 18

## 2013-04-20 DIAGNOSIS — W19XXXA Unspecified fall, initial encounter: Secondary | ICD-10-CM

## 2013-04-20 DIAGNOSIS — R51 Headache: Secondary | ICD-10-CM

## 2013-04-20 DIAGNOSIS — G609 Hereditary and idiopathic neuropathy, unspecified: Secondary | ICD-10-CM

## 2013-04-20 DIAGNOSIS — R519 Headache, unspecified: Secondary | ICD-10-CM

## 2013-04-20 DIAGNOSIS — M3500 Sicca syndrome, unspecified: Secondary | ICD-10-CM

## 2013-04-20 DIAGNOSIS — R109 Unspecified abdominal pain: Secondary | ICD-10-CM

## 2013-04-20 DIAGNOSIS — S0181XA Laceration without foreign body of other part of head, initial encounter: Secondary | ICD-10-CM

## 2013-04-20 DIAGNOSIS — N390 Urinary tract infection, site not specified: Secondary | ICD-10-CM

## 2013-04-20 DIAGNOSIS — S0180XA Unspecified open wound of other part of head, initial encounter: Secondary | ICD-10-CM

## 2013-04-20 LAB — POCT UA - MICROSCOPIC ONLY
Casts, Ur, LPF, POC: NEGATIVE
Crystals, Ur, HPF, POC: NEGATIVE
Mucus, UA: NEGATIVE
Renal tubular cells: POSITIVE
Yeast, UA: NEGATIVE

## 2013-04-20 LAB — POCT URINALYSIS DIPSTICK
Bilirubin, UA: NEGATIVE
Blood, UA: NEGATIVE
Glucose, UA: NEGATIVE
Nitrite, UA: NEGATIVE
Protein, UA: NEGATIVE
Spec Grav, UA: 1.025
Urobilinogen, UA: 0.2
pH, UA: 7

## 2013-04-20 NOTE — Progress Notes (Signed)
   Patient ID: Ariana [name] MRN: [id number], DOB: 02/26/1929, 80 y.o. Date of Encounter: 04/20/2013, 2:26 PM   PROCEDURE NOTE: Verbal consent obtained.  Risks and benefits of the procedure were explained. Patient made an informed decision to proceed with the procedure. Sterile technique employed. Numbing: Anesthesia obtained with 1% lidocaine with epi 3 cc for local anesthesia .  Cleansed with soap and water. Irrigated. Betadine prep per usual protocol.  Wound explored, no deep structures involved, no foreign bodies.   Wound repaired with # 8 simple interrupted sutures of 6-0 Ethilon.  Hemostasis obtained. Wound cleansed and dressed.  Wound care instructions including precautions covered with patient. Handout given.  Anticipate suture removal in 5 days.   Signed, Christell Faith, MHS, PA-C [facility] [employer] [place] [address] [facility] 04/20/2013 2:26 PM Results for orders placed in visit on 12/05/12  ANGIOTENSIN CONVERTING ENZYME      Result Value Ref Range   Angio Convert Enzyme 15  14 - 82 U/L  RHEUMATOID FACTOR      Result Value Ref Range   Rheumatoid Factor 11.9  0.0 - 13.9 IU/mL  ANA W/REFLEX      Result Value Ref Range   ANA Positive (*) Negative  IFE AND PE, SERUM      Result Value Ref Range   IgG (Immunoglobin G), Serum 1368  700 - 1600 mg/dL   IgA/Immunoglobulin A, Serum 237  91 - 414 mg/dL   IgM (Immunoglobin M), Srm 152  40 - 230 mg/dL   Total Protein 6.6  6.0 - 8.5 g/dL   Albumin SerPl Elph-Mcnc 3.8  3.2 - 5.6 g/dL   Alpha 1 0.2  0.1 - 0.4 g/dL   Alpha2 Glob SerPl Elph-Mcnc 0.5  0.4 - 1.2 g/dL   B-Globulin SerPl Elph-Mcnc 0.8  0.6 - 1.3 g/dL   Gamma Glob SerPl Elph-Mcnc 1.3  0.5 - 1.6 g/dL   M Protein SerPl Elph-Mcnc Not Observed  Not Observed g/dL   Globulin, Total 2.8  2.0 - 4.5 g/dL   Albumin/Glob SerPl 1.4  0.7 - 2.0   IFE 1 Comment     Please Note Comment    VITAMIN B12      Result Value Ref Range   Vitamin  B-12 780  211 - 946 pg/mL  ENA+DNA/DS+SJORGEN'S      Result Value Ref Range   dsDNA Ab 1  0 - 9 IU/mL   ENA RNP Ab >8.0 (*) 0.0 - 0.9 AI   ENA SM Ab Ser-aCnc <0.2  0.0 - 0.9 AI   ENA SSA (RO) Ab <0.2  0.0 - 0.9 AI   ENA SSB (LA) Ab 0.2  0.0 - 0.9 AI   See below: Comment     CT: Negative scan  Plan: Followup in 2 days with PCP or at this office

## 2013-04-20 NOTE — Progress Notes (Addendum)
Subjective:    Patient ID: Ariana [name], female    DOB: 1929-09-02, 80 y.o.   MRN: [id number]  HPI This chart was scribed for Elvina SidleKurt Lauenstein, MD by Andrew Auaven Small, ED Scribe. This patient was seen in room 7 and the patient's care was started at 1:22 PM.  HPI Comments: Ariana [name] is a widowed 80 y.o. female  who presents to the [facility] complaining of a fall onset prior to arrival. Pt reports that she hit her head on the corner of her night stand while trying to hang up the phone. She reports that she lost her balance, fell on the floor and landed on her back shortly after hitting her head. She now complains of a frontal laceration, lower right sided flank pain and a right sided temporal HA. Pt denies biting her tongue due to not having her dentures in. Pt reports that she lives alone and is unsure of syncope. Pt has multiple falls and reports she has neuropathy in which she used to see Dr. Anne HahnWillis who has retired. Pt states that she walks with a cane. Pt has a caregiver for about 8 hours a day but reports that she is alone at night. Pt states her daughter and son in law come check on her. Pt states she takes HTN medication.   Past Medical History  Diagnosis Date   Sjogren's syndrome    Dry eye syndrome    Hypertension, benign    Mitral valve prolapse    GERD (gastroesophageal reflux disease)    Diverticulosis of colon    Irritable bowel syndrome    Urinary incontinence    Low back pain syndrome    Fibromyalgia    Memory loss    Anxiety and depression    History of adverse drug reaction    Peripheral neuropathy     "both feet and legs"   Shortness of breath 07/18/11    "alot lately"   Anginal pain    History of recurrent UTIs    H/O hiatal hernia    Anxiety    Dementia    Depression    Abnormality of gait    Thyroid nodule    Headache(784.0) 09/05/2012   Adenomatous polyp of colon 2002    7mm   Allergies  Allergen Reactions    Codeine Nausea Only    unless given with Phenergan   Doxycycline     Unknown   Klonopin [Clonazepam]     Causes hallucination    Meperidine Hcl Nausea Only    unless given with Phenergan   Naproxen    Norflex [Orphenadrine Citrate] Nausea Only    Unless given with Phenergan   Oxycodone-Acetaminophen Nausea Only    unless given with phenergan   Penicillins     Unknown   Phenothiazines     Unknown   Propoxyphene Hcl Nausea Only    unless given with phenergan   Stelazine     Unknown   Sulfamethoxazole-Trimethoprim     Unknown   Tolectin [Tolmetin Sodium]     Unknown   Tramadol     Unknown   Zoloft [Sertraline Hcl]     Caused pt to sleep a lot   Lubiprostone Rash   Prior to Admission medications   Medication Sig Start Date End Date Taking? Authorizing Provider  ALPRAZolam Prudy Feeler(XANAX) 0.5 MG tablet Take 1 tablet (0.5 mg total) by mouth 2 (two) times daily as needed for anxiety. 03/07/13  Yes Lonzo CloudScott M  Lenna Gilford, MD  aspirin EC 81 MG tablet Take 81 mg by mouth daily.   Yes Historical Provider, MD  Calcium Carbonate-Vitamin D (CALTRATE 600+D) 600-400 MG-UNIT per tablet Take 2 tablets by mouth daily.     Yes Historical Provider, MD  carboxymethylcellulose (REFRESH TEARS) 0.5 % SOLN 1 drop 2 (two) times daily.     Yes Historical Provider, MD  cholecalciferol (VITAMIN D) 1000 UNITS tablet Take 1,000 Units by mouth daily.   Yes Historical Provider, MD  diltiazem (CARDIZEM CD) 120 MG 24 hr capsule TAKE ONE CAPSULE BY MOUTH EVERY DAY 11/22/12  Yes Noralee Space, MD  DULoxetine (CYMBALTA) 30 MG capsule TAKE ONE CAPSULE BY MOUTH EVERY DAY 01/03/13  Yes Kathrynn Ducking, MD  DULoxetine (CYMBALTA) 60 MG capsule Take 60 mg by mouth daily.   Yes Historical Provider, MD  estrogens, conjugated, (PREMARIN) 0.45 MG tablet Take 0.45 mg by mouth daily. Take daily for 21 days then do not take for 7 days.   Yes Historical Provider, MD  Flaxseed, Linseed, (FLAXSEED OIL) 1000 MG CAPS Take 1 capsule  by mouth 2 (two) times daily.   Yes Historical Provider, MD  folic acid (FOLVITE) 1 MG tablet TAKE 1 TABLET TWICE A DAY 01/31/13  Yes Noralee Space, MD  furosemide (LASIX) 20 MG tablet TAKE 1 TABLET BY MOUTH EVERY MORNING AS NEEDED FOR SWELLING 07/20/12  Yes Noralee Space, MD  gabapentin (NEURONTIN) 400 MG capsule TAKE ONE CAPSULE BY MOUTH 3 TIMES A DAY 03/28/13  Yes Kathrynn Ducking, MD  HYDROcodone-acetaminophen (NORCO/VICODIN) 5-325 MG per tablet TAKE 1 TABLET 3 TIMES A DAY AS NEEDED FOR PAIN MAX 3 PER DAY 03/07/13  Yes Noralee Space, MD  hydroxychloroquine (PLAQUENIL) 200 MG tablet Take 200 mg by mouth daily. As directed by Dr. Estanislado Pandy    Yes Historical Provider, MD  lansoprazole (PREVACID) 30 MG capsule TAKE ONE CAPSULE BY MOUTH EVERY DAY 01/06/13  Yes Noralee Space, MD  lisinopril (PRINIVIL,ZESTRIL) 20 MG tablet TAKE 1 TABLET BY MOUTH EVERY DAY 12/13/12  Yes Pixie Casino, MD  metoprolol (LOPRESSOR) 50 MG tablet TAKE 1/2 TABLET BY MOUTH TWICE A DAY 11/16/12  Yes Pixie Casino, MD  Multiple Vitamin (MULTIVITAMIN) tablet Take 1 tablet by mouth daily.     Yes Historical Provider, MD  nystatin (MYCOSTATIN) 100000 UNIT/ML suspension 1 teaspoonful to gargle and swallow 4 times daily as needed 12/13/12  Yes Noralee Space, MD  Omega-3 Fatty Acids (FISH OIL) 1000 MG CAPS Take 1 capsule by mouth daily.     Yes Historical Provider, MD  polyethylene glycol (MIRALAX / GLYCOLAX) packet Take 17 g by mouth daily.    Yes Historical Provider, MD  CRANBERRY PO Take 2 capsules by mouth daily.    Historical Provider, MD  donepezil (ARICEPT) 5 MG tablet Take 1 tablet (5 mg total) by mouth at bedtime. 11/28/12   Gatha Mayer, MD  hydrocortisone (ANUSOL-HC) 2.5 % rectal cream Apply to rectal area after each BM 08/02/11   Noralee Space, MD  mirabegron ER (MYRBETRIQ) 50 MG TB24 Take 50 mg by mouth daily.    Historical Provider, MD  pilocarpine (SALAGEN) 5 MG tablet Take 5 mg by mouth 2 (two) times daily. As  directed by Dr. Estanislado Pandy     Historical Provider, MD  sodium chloride (OCEAN) 0.65 % SOLN nasal spray Place 1 spray into the nose as needed for congestion. 08/23/12   Modena Jansky, MD  Review of Systems  Genitourinary: Positive for flank pain.  Musculoskeletal: Positive for back pain.  Skin: Positive for wound.  Neurological: Positive for headaches.       Objective:   Physical Exam  Nursing note and vitals reviewed. Constitutional: She is oriented to person, place, and time. She appears well-developed and well-nourished. No distress.  HENT:  Head: Normocephalic and atraumatic.  Eyes: EOM are normal.  Neck: Neck supple. Thyromegaly present.  Patient is fullness in the left lower neck at the insertion of the sternocleidomastoid muscle  Cardiovascular: Normal rate.   Pulmonary/Chest: Effort normal.  Musculoskeletal: Normal range of motion.  Neurological: She is alert and oriented to person, place, and time.  Numbness bilaterally in lower extremities from the toes to upper thighs  Skin: Skin is warm and dry. Laceration noted.  Psychiatric: She has a normal mood and affect. Her behavior is normal.  4 cm diagonal laceration right frontal region going from upper right to lower midline  Please see PA note for laceration repair   neurological exam: Patient has full range of extraocular movement, normal palate excursion, normal tongue movement, normal facial muscles, normal hearing, normal mental status. Neck: Supple no adenopathy  I spoke to the son-in-law at length. Were both concerned about her falls and concerned that she needs more round-the-clock supervision and to have the house examined for potential future falls.      Assessment & Plan:   Right-sided headache - Plan: CT Head Wo Contrast  Facial laceration - Plan: Ambulatory referral to Social Work  Falls - Plan: Ambulatory referral to Social Work  Sjogren's disease  Unspecified hereditary and idiopathic peripheral  neuropathy - Plan: Ambulatory referral to Social Work  Flank pain, acute - Plan: POCT UA - Microscopic Only, POCT urinalysis dipstick  UTI (urinary tract infection) - Plan: POCT UA - Microscopic Only, POCT urinalysis dipstick  Signed, Robyn Haber, MD

## 2013-04-20 NOTE — Patient Instructions (Signed)
Please proceed now to [facility] . Register as Outpatient For Head CT.

## 2013-04-21 ENCOUNTER — Telehealth: Payer: Self-pay | Admitting: Pulmonary Disease

## 2013-04-21 MED ORDER — HYDROCODONE-ACETAMINOPHEN 5-325 MG PO TABS: ORAL_TABLET | ORAL | Status: DC

## 2013-04-21 NOTE — Telephone Encounter (Signed)
Per SN---  Ok to refill the hydrocodone.   Please remind the pts daughter that she will need to set the pt up with a new primary care doctor as of April 1.  thanks

## 2013-04-21 NOTE — Telephone Encounter (Signed)
Pt last refill on norco 03/07/13 #90 x 0 refills She also reports UTI is making the mental state worse. So much so pt caregiver quit on Friday.  Yesterday morning pt got up and fell. She hit her head and called a friend. Pt was taken to [facility]. She now has stitches and sent to have CT done.  Pt is being treated for UTI. Please advise regarding refill Dr. Lenna Gilford thanks

## 2013-04-21 NOTE — Telephone Encounter (Signed)
rx printed and left for pick up. Made daughter aware. Nothing further needed

## 2013-04-22 ENCOUNTER — Telehealth: Payer: Self-pay | Admitting: *Deleted

## 2013-04-22 NOTE — Telephone Encounter (Signed)
Advised pt to keep area clean. She may wash with warm water and soap. Pt concerned that she may hit it in her sleep. Advised her it was ok to cover the wound at night so it was protected. Pt will RTC in 5 days for recheck.

## 2013-04-22 NOTE — Telephone Encounter (Signed)
Pt called to see if she still needs to be washing her wound with soap & water and changing her bandage. She said it is a little puffy and sore.  Please call her @ [phone]

## 2013-04-24 ENCOUNTER — Ambulatory Visit: Payer: Medicare Other

## 2013-04-24 ENCOUNTER — Ambulatory Visit (INDEPENDENT_AMBULATORY_CARE_PROVIDER_SITE_OTHER): Payer: Medicare Other | Admitting: Family Medicine

## 2013-04-24 VITALS — BP 152/84 | HR 63 | Temp 97.9°F | Resp 16 | Ht 64.0 in | Wt 112.0 lb

## 2013-04-24 DIAGNOSIS — M25559 Pain in unspecified hip: Secondary | ICD-10-CM

## 2013-04-24 DIAGNOSIS — R2681 Unsteadiness on feet: Secondary | ICD-10-CM

## 2013-04-24 DIAGNOSIS — S0181XA Laceration without foreign body of other part of head, initial encounter: Secondary | ICD-10-CM

## 2013-04-24 DIAGNOSIS — R0781 Pleurodynia: Secondary | ICD-10-CM

## 2013-04-24 DIAGNOSIS — R269 Unspecified abnormalities of gait and mobility: Secondary | ICD-10-CM

## 2013-04-24 DIAGNOSIS — R32 Unspecified urinary incontinence: Secondary | ICD-10-CM

## 2013-04-24 DIAGNOSIS — R079 Chest pain, unspecified: Secondary | ICD-10-CM

## 2013-04-24 LAB — POCT UA - MICROSCOPIC ONLY
Bacteria, U Microscopic: NEGATIVE
Casts, Ur, LPF, POC: NEGATIVE
Crystals, Ur, HPF, POC: NEGATIVE
Mucus, UA: NEGATIVE
Yeast, UA: NEGATIVE

## 2013-04-24 LAB — POCT URINALYSIS DIPSTICK
Bilirubin, UA: NEGATIVE
Blood, UA: NEGATIVE
Glucose, UA: NEGATIVE
Ketones, UA: NEGATIVE
Nitrite, UA: NEGATIVE
Protein, UA: NEGATIVE
Spec Grav, UA: 1.02
Urobilinogen, UA: 1
pH, UA: 6

## 2013-04-24 NOTE — Patient Instructions (Addendum)
Please see your personal doctor in the next week to get a plan together to prevent falls  Return Saturday for suture removal  Fall Prevention and Home Safety Falls cause injuries and can affect all age groups. It is possible to prevent falls.  HOW TO PREVENT FALLS  Wear shoes with rubber soles that do not have an opening for your toes.  Keep the inside and outside of your house well lit.  Use night lights throughout your home.  Remove clutter from floors.  Clean up floor spills.  Remove throw rugs or fasten them to the floor with carpet tape.  Do not place electrical cords across pathways.  Put grab bars by your tub, shower, and toilet. Do not use towel bars as grab bars.  Put handrails on both sides of the stairway. Fix loose handrails.  Do not climb on stools or stepladders, if possible.  Do not wax your floors.  Repair uneven or unsafe sidewalks, walkways, or stairs.  Keep items you use a lot within reach.  Be aware of pets.  Keep emergency numbers next to the telephone.  Put smoke detectors in your home and near bedrooms. Ask your doctor what other things you can do to prevent falls. Document Released: 11/26/2008 Document Revised: 08/01/2011 Document Reviewed: 05/02/2011 [facility] Patient Information 2014 [place], [place].

## 2013-04-24 NOTE — Progress Notes (Signed)
80 yo woman who fell four days ago and lacerated her forehead.  She had a negative CT.  Patient fell last Christmas time as well and has had continued right rib and hip pain.  Patient has daughter (son died) and son in law who look in on her.  She does not trust her daughter.  Patient lives in [place].  She has a Actuary during the day, but nobody watching her at night.  Patient notes peripheral neuropathy and h/o palpitations.  PCP is Dr. Lenna Gilford.  She has also seen Dr. Casimiro Needle  Objective:  NAD, pressured speech Mental status:  Oriented to date:  "Thursday, 11th of March" Raccoon eyes CN III-XII:  Intact Gait:  Very unstable.  Neck:  Supple, nontender, no bruits, left thyroid nodule (irregular) Chest:  Dry rales at bases, mildly tender lower lateral right ribs Heart: regular with faint I/VI systolic murmur  Face:  Laceration looks like it is healing well.  Results for orders placed in visit on 04/20/13  POCT UA - MICROSCOPIC ONLY      Result Value Ref Range   WBC, Ur, HPF, POC 4-8     RBC, urine, microscopic 0-3     Bacteria, U Microscopic 1+     Mucus, UA neg     Epithelial cells, urine per micros 1-11(clusters)     Crystals, Ur, HPF, POC neg     Casts, Ur, LPF, POC neg     Yeast, UA neg     Renal tubular cells pos    POCT URINALYSIS DIPSTICK      Result Value Ref Range   Color, UA yellow     Clarity, UA clear     Glucose, UA neg     Bilirubin, UA neg     Ketones, UA eng     Spec Grav, UA 1.025     Blood, UA neg     pH, UA 7.0     Protein, UA neg     Urobilinogen, UA 0.2     Nitrite, UA neg     Leukocytes, UA Trace     [facility] reading (PRIMARY) by  Dr. Joseph Art. Ribs and hip on right:    Healing rib fractures number 10 and 11; hip normal Results for orders placed in visit on 04/24/13  POCT URINALYSIS DIPSTICK      Result Value Ref Range   Color, UA yellow     Clarity, UA clear     Glucose, UA neg     Bilirubin, UA neg     Ketones, UA neg     Spec Grav, UA 1.020      Blood, UA neg     pH, UA 6.0     Protein, UA neg     Urobilinogen, UA 1.0     Nitrite, UA neg     Leukocytes, UA Trace    POCT UA - MICROSCOPIC ONLY      Result Value Ref Range   WBC, Ur, HPF, POC 0-4     RBC, urine, microscopic 0-1     Bacteria, U Microscopic neg     Mucus, UA neg     Epithelial cells, urine per micros 0-2     Crystals, Ur, HPF, POC neg     Casts, Ur, LPF, POC neg     Yeast, UA neg      Assessment: I continue to be very concerned about patient's ability to take care of herself at home. She cannot walk unassisted. I'm  concerned that her medicines needed for complete review because these may be playing a role in her instability. I once again asked her to followup with her primary care doctor. I've also call social services to see if they will evaluate patient's home. (I did this through [facility]). I may make concerns clear both patient and her nurse assistant.  Plan: Refer back to PCP Return for suture removal Saturday Urine culture Urge closer supervision.  Ruthe Mannan, MD

## 2013-04-25 NOTE — Addendum Note (Signed)
Addended by: Robyn Haber on: 04/25/2013 12:30 PM   Modules accepted: Orders

## 2013-04-26 ENCOUNTER — Ambulatory Visit (INDEPENDENT_AMBULATORY_CARE_PROVIDER_SITE_OTHER): Payer: Medicare Other | Admitting: Physician Assistant

## 2013-04-26 VITALS — BP 120/82 | HR 57 | Temp 97.8°F | Resp 16 | Ht 64.0 in | Wt 112.0 lb

## 2013-04-26 DIAGNOSIS — S0181XA Laceration without foreign body of other part of head, initial encounter: Secondary | ICD-10-CM

## 2013-04-26 DIAGNOSIS — S0180XA Unspecified open wound of other part of head, initial encounter: Secondary | ICD-10-CM

## 2013-04-26 LAB — URINE CULTURE
Colony Count: NO GROWTH
Organism ID, Bacteria: NO GROWTH

## 2013-04-26 NOTE — Patient Instructions (Signed)
I will ask Dr. Joseph Art to follow-up with you about establishing with a new primary care provider. I suggest [name] [phone].  When you see the urologist (Dr. Diona Fanti), ask about another medication for overactive bladder.

## 2013-04-27 ENCOUNTER — Other Ambulatory Visit: Payer: Self-pay | Admitting: Family Medicine

## 2013-04-27 DIAGNOSIS — R2681 Unsteadiness on feet: Secondary | ICD-10-CM

## 2013-04-27 NOTE — Progress Notes (Signed)
   Subjective:    Patient ID: Ariana [name], female    DOB: Dec 28, 1929, 80 y.o.   MRN: [id number]   PCP: Noralee Space, MD  Chief Complaint  Patient presents with  . Suture / Staple Removal    Medications, allergies, past medical history, surgical history, family history, social history and problem list reviewed and updated.   HPI  This patient presents for removal of sutures placed 04/20/2013 to close a wound to the forehead sustained when she fell. She's had multiple falls recently, which she relates to peripheral neuropathy, for which she takes gabapentin.  She has multiple medical problems, and complains that her daughter is trying to put her in a "place." She's had several CMAs that she's not gotten along with, and needs a new PCP (phone note indicates that she's been advised that Dr. Lenna Gilford will no longer be her PCP after 05/14/2013.   Review of Systems     Objective:   Physical Exam  BP 120/82  Pulse 57  Temp(Src) 97.8 F (36.6 C) (Oral)  Resp 16  Ht 5\' 4"  (1.626 m)  Wt 112 lb (50.803 kg)  BMI 19.22 kg/m2  SpO2 97% WD WF awake, alert and in no acute distress.  She's quite verbose, doesn't trust her daughter, has multiple questions about her medications, feels like they're not being given to her properly. We reviewed these in detail, looked up each pill to confirm it's identity, etc.  The wound of the forehead is well healed, without drainage, erythema or edema. The sutures are removed without incident.     Assessment & Plan:  1. Facial laceration Local wound care.  Counseled at length about her medications, following up and asking questions of her specialists.  Patient Instructions  I will ask Dr. Joseph Art to follow-up with you about establishing with a new primary care provider. I suggest [name] [phone].  When you see the urologist (Dr. Diona Fanti), ask about another medication for overactive bladder.      Fara Chute,  PA-C Physician Assistant-Certified [facility]

## 2013-05-02 ENCOUNTER — Telehealth: Payer: Self-pay

## 2013-05-02 NOTE — Telephone Encounter (Signed)
Patient calling very upset with all of her issues going on, says we were suppost to call her regarding getting a new medical doctor and she has not received a call and just needs some help [phone]

## 2013-05-02 NOTE — Telephone Encounter (Signed)
Spoke with patient, she is having trouble swallowing food from time to time.  Also, she has had black tarry stool x yesterday; asked patient to come in tomorrow for evaluation.  Patient plans on doing this.

## 2013-05-03 ENCOUNTER — Ambulatory Visit: Payer: Medicare Other

## 2013-05-03 ENCOUNTER — Other Ambulatory Visit: Payer: Self-pay | Admitting: Family Medicine

## 2013-05-03 ENCOUNTER — Ambulatory Visit (INDEPENDENT_AMBULATORY_CARE_PROVIDER_SITE_OTHER): Payer: Medicare Other | Admitting: Family Medicine

## 2013-05-03 VITALS — BP 130/60 | HR 72 | Temp 97.7°F | Resp 18

## 2013-05-03 DIAGNOSIS — F411 Generalized anxiety disorder: Secondary | ICD-10-CM

## 2013-05-03 DIAGNOSIS — R0600 Dyspnea, unspecified: Secondary | ICD-10-CM

## 2013-05-03 DIAGNOSIS — B370 Candidal stomatitis: Secondary | ICD-10-CM

## 2013-05-03 DIAGNOSIS — R0989 Other specified symptoms and signs involving the circulatory and respiratory systems: Secondary | ICD-10-CM

## 2013-05-03 DIAGNOSIS — R269 Unspecified abnormalities of gait and mobility: Secondary | ICD-10-CM

## 2013-05-03 DIAGNOSIS — R2681 Unsteadiness on feet: Secondary | ICD-10-CM

## 2013-05-03 DIAGNOSIS — E876 Hypokalemia: Secondary | ICD-10-CM

## 2013-05-03 DIAGNOSIS — R0609 Other forms of dyspnea: Secondary | ICD-10-CM

## 2013-05-03 DIAGNOSIS — J209 Acute bronchitis, unspecified: Secondary | ICD-10-CM

## 2013-05-03 LAB — POCT CBC
Granulocyte percent: 67.7 %G (ref 37–80)
HCT, POC: 37.7 % (ref 37.7–47.9)
Hemoglobin: 12.1 g/dL — AB (ref 12.2–16.2)
Lymph, poc: 2 (ref 0.6–3.4)
MCH, POC: 30.8 pg (ref 27–31.2)
MCHC: 32.1 g/dL (ref 31.8–35.4)
MCV: 96 fL (ref 80–97)
MID (cbc): 0.6 (ref 0–0.9)
MPV: 9.1 fL (ref 0–99.8)
POC Granulocyte: 5.3 (ref 2–6.9)
POC LYMPH PERCENT: 25 %L (ref 10–50)
POC MID %: 7.3 %M (ref 0–12)
Platelet Count, POC: 230 10*3/uL (ref 142–424)
RBC: 3.93 M/uL — AB (ref 4.04–5.48)
RDW, POC: 14 %
WBC: 7.9 10*3/uL (ref 4.6–10.2)

## 2013-05-03 LAB — COMPREHENSIVE METABOLIC PANEL
ALT: 16 U/L (ref 0–35)
AST: 22 U/L (ref 0–37)
Albumin: 4.1 g/dL (ref 3.5–5.2)
Alkaline Phosphatase: 62 U/L (ref 39–117)
BUN: 14 mg/dL (ref 6–23)
CO2: 27 mEq/L (ref 19–32)
Calcium: 9.3 mg/dL (ref 8.4–10.5)
Chloride: 97 mEq/L (ref 96–112)
Creat: 0.69 mg/dL (ref 0.50–1.10)
Glucose, Bld: 118 mg/dL — ABNORMAL HIGH (ref 70–99)
Potassium: 3.3 mEq/L — ABNORMAL LOW (ref 3.5–5.3)
Sodium: 138 mEq/L (ref 135–145)
Total Bilirubin: 0.6 mg/dL (ref 0.2–1.2)
Total Protein: 6.6 g/dL (ref 6.0–8.3)

## 2013-05-03 LAB — POCT URINALYSIS DIPSTICK
Bilirubin, UA: NEGATIVE
Blood, UA: NEGATIVE
Glucose, UA: NEGATIVE
Nitrite, UA: NEGATIVE
Protein, UA: NEGATIVE
Spec Grav, UA: 1.02
Urobilinogen, UA: 0.2
pH, UA: 7

## 2013-05-03 LAB — POCT SEDIMENTATION RATE: POCT SED RATE: 9 mm/hr (ref 0–22)

## 2013-05-03 MED ORDER — ALPRAZOLAM 0.5 MG PO TABS: 0.5000 mg | ORAL_TABLET | Freq: Two times a day (BID) | ORAL | Status: DC | PRN

## 2013-05-03 MED ORDER — HYDROCODONE-ACETAMINOPHEN 5-325 MG PO TABS: ORAL_TABLET | ORAL | Status: DC

## 2013-05-03 MED ORDER — FLUCONAZOLE 150 MG PO TABS: 150.0000 mg | ORAL_TABLET | Freq: Once | ORAL | Status: DC

## 2013-05-03 MED ORDER — AZITHROMYCIN 250 MG PO TABS: ORAL_TABLET | ORAL | Status: DC

## 2013-05-03 MED ORDER — POTASSIUM CHLORIDE ER 10 MEQ PO TBCR: 10.0000 meq | EXTENDED_RELEASE_TABLET | Freq: Every day | ORAL | Status: DC

## 2013-05-03 NOTE — Progress Notes (Signed)
80 yo woman with unstable gait who has caregiver during the day and no one at home.  She never gets out of the house except to the doctor.  She has had problems with dentures, TMJ ([place])  She fell two weeks ago and lacerated her forehead.  I referred her to home health, but nothing has happened with tthat.  She was started on the 10th on Ativan.  She recently reduced her Cymbalta to 60 mg daily from 120 mg because she didn't feel "right".  Patient has developed headache x 3-4 weeks and shortness of breath over the past two weeks.  Things have gotten worse this week.  In addition, her right low back has worsened and her right lower quadrant abdominal pain.  She sees Drs. Jannifer Franklin, Dahlstedt, [place], Deveshwar (Sjogren's), Ron Nori Riis, and Dr. Lenna Gilford (who is retiring April 1).   Objective:  Anxious, talkative, appropriate HEENT:  Fading ecchymosis below eyes.  Healing forehead Chest:  Few expiratory wheezes Heart:  Reg, no murmur Abdomen:  Soft, mildly tender RLQ Ext: no edema Results for orders placed in visit on 05/03/13  POCT CBC      Result Value Ref Range   WBC 7.9  4.6 - 10.2 K/uL   Lymph, poc 2.0  0.6 - 3.4   POC LYMPH PERCENT 25.0  10 - 50 %L   MID (cbc) 0.6  0 - 0.9   POC MID % 7.3  0 - 12 %M   POC Granulocyte 5.3  2 - 6.9   Granulocyte percent 67.7  37 - 80 %G   RBC 3.93 (*) 4.04 - 5.48 M/uL   Hemoglobin 12.1 (*) 12.2 - 16.2 g/dL   HCT, POC 37.7  37.7 - 47.9 %   MCV 96.0  80 - 97 fL   MCH, POC 30.8  27 - 31.2 pg   MCHC 32.1  31.8 - 35.4 g/dL   RDW, POC 14.0     Platelet Count, POC 230  142 - 424 K/uL   MPV 9.1  0 - 99.8 fL  POCT URINALYSIS DIPSTICK      Result Value Ref Range   Color, UA yellow     Clarity, UA clear     Glucose, UA neg     Bilirubin, UA neg     Ketones, UA trace     Spec Grav, UA 1.020     Blood, UA neg     pH, UA 7.0     Protein, UA neg     Urobilinogen, UA 0.2     Nitrite, UA neg     Leukocytes, UA Trace     [facility] reading (PRIMARY) by  Dr.  Joseph Art  CXR:  Streaky densities, rib fx 10-11.    We had a family conference.I spent 45 minutes with the daughters and patient face-to-face time to review her met past medical history and her current situation.  Assessment:  Patient has significant anxiety. She's not getting out or interacting with people are positive basis. I can tell from body language that her family set up with her.   Plan:   I will care for patient until she can get into see [name], her new PCP, in June. Stop neurontin, folate, ativan  Recheck 72 hours  Ruthe Mannan, MD

## 2013-05-03 NOTE — Patient Instructions (Addendum)
Stop neurontin, folate, ativan, mycostatin

## 2013-05-06 ENCOUNTER — Emergency Department (HOSPITAL_COMMUNITY)
Admission: EM | Admit: 2013-05-06 | Discharge: 2013-05-07 | Disposition: A | Discharge status: Home / Self Care | Payer: Medicare Other | Attending: Emergency Medicine | Admitting: Emergency Medicine

## 2013-05-06 ENCOUNTER — Emergency Department (HOSPITAL_COMMUNITY): Payer: Medicare Other

## 2013-05-06 ENCOUNTER — Encounter (HOSPITAL_COMMUNITY): Payer: Self-pay | Admitting: Emergency Medicine

## 2013-05-06 ENCOUNTER — Ambulatory Visit (INDEPENDENT_AMBULATORY_CARE_PROVIDER_SITE_OTHER): Payer: Medicare Other | Admitting: Family Medicine

## 2013-05-06 VITALS — BP 160/82 | HR 70 | Temp 97.6°F | Resp 20 | Ht 63.0 in | Wt 112.6 lb

## 2013-05-06 DIAGNOSIS — F0390 Unspecified dementia without behavioral disturbance: Secondary | ICD-10-CM | POA: Insufficient documentation

## 2013-05-06 DIAGNOSIS — Z8669 Personal history of other diseases of the nervous system and sense organs: Secondary | ICD-10-CM | POA: Insufficient documentation

## 2013-05-06 DIAGNOSIS — R269 Unspecified abnormalities of gait and mobility: Secondary | ICD-10-CM

## 2013-05-06 DIAGNOSIS — R51 Headache: Secondary | ICD-10-CM | POA: Insufficient documentation

## 2013-05-06 DIAGNOSIS — Z792 Long term (current) use of antibiotics: Secondary | ICD-10-CM | POA: Insufficient documentation

## 2013-05-06 DIAGNOSIS — F29 Unspecified psychosis not due to a substance or known physiological condition: Secondary | ICD-10-CM

## 2013-05-06 DIAGNOSIS — F411 Generalized anxiety disorder: Secondary | ICD-10-CM | POA: Insufficient documentation

## 2013-05-06 DIAGNOSIS — K219 Gastro-esophageal reflux disease without esophagitis: Secondary | ICD-10-CM | POA: Insufficient documentation

## 2013-05-06 DIAGNOSIS — Z880 Allergy status to penicillin: Secondary | ICD-10-CM | POA: Insufficient documentation

## 2013-05-06 DIAGNOSIS — I209 Angina pectoris, unspecified: Secondary | ICD-10-CM | POA: Insufficient documentation

## 2013-05-06 DIAGNOSIS — F329 Major depressive disorder, single episode, unspecified: Secondary | ICD-10-CM | POA: Insufficient documentation

## 2013-05-06 DIAGNOSIS — I10 Essential (primary) hypertension: Secondary | ICD-10-CM | POA: Insufficient documentation

## 2013-05-06 DIAGNOSIS — R2681 Unsteadiness on feet: Secondary | ICD-10-CM

## 2013-05-06 DIAGNOSIS — Z87440 Personal history of urinary (tract) infections: Secondary | ICD-10-CM | POA: Insufficient documentation

## 2013-05-06 DIAGNOSIS — Z860100 Personal history of colon polyps, unspecified: Secondary | ICD-10-CM | POA: Insufficient documentation

## 2013-05-06 DIAGNOSIS — Z79899 Other long term (current) drug therapy: Secondary | ICD-10-CM | POA: Insufficient documentation

## 2013-05-06 DIAGNOSIS — Z87891 Personal history of nicotine dependence: Secondary | ICD-10-CM | POA: Insufficient documentation

## 2013-05-06 DIAGNOSIS — F3289 Other specified depressive episodes: Secondary | ICD-10-CM | POA: Insufficient documentation

## 2013-05-06 DIAGNOSIS — Z7982 Long term (current) use of aspirin: Secondary | ICD-10-CM | POA: Insufficient documentation

## 2013-05-06 DIAGNOSIS — R410 Disorientation, unspecified: Secondary | ICD-10-CM

## 2013-05-06 LAB — URINALYSIS, ROUTINE W REFLEX MICROSCOPIC
Bilirubin Urine: NEGATIVE
GLUCOSE, UA: NEGATIVE mg/dL
HGB URINE DIPSTICK: NEGATIVE
Ketones, ur: NEGATIVE mg/dL
LEUKOCYTES UA: NEGATIVE
Nitrite: NEGATIVE
PROTEIN: NEGATIVE mg/dL
SPECIFIC GRAVITY, URINE: 1.006 (ref 1.005–1.030)
UROBILINOGEN UA: 0.2 mg/dL (ref 0.0–1.0)
pH: 6.5 (ref 5.0–8.0)

## 2013-05-06 LAB — BASIC METABOLIC PANEL
BUN: 14 mg/dL (ref 6–23)
CHLORIDE: 98 meq/L (ref 96–112)
CO2: 28 meq/L (ref 19–32)
Calcium: 10.3 mg/dL (ref 8.4–10.5)
Creatinine, Ser: 0.61 mg/dL (ref 0.50–1.10)
GFR calc Af Amer: >90 mL/min (ref >90)
GFR calc non Af Amer: 83 mL/min — ABNORMAL LOW (ref >90)
GLUCOSE: 107 mg/dL — AB (ref 70–99)
POTASSIUM: 3.8 meq/L (ref 3.7–5.3)
SODIUM: 138 meq/L (ref 137–147)

## 2013-05-06 LAB — CBC WITH DIFFERENTIAL/PLATELET
BASOS ABS: 0 10*3/uL (ref 0.0–0.1)
Basophils Relative: 0 % (ref 0–1)
Eosinophils Absolute: 0.4 10*3/uL (ref 0.0–0.7)
Eosinophils Relative: 5 % (ref 0–5)
HCT: 38.1 % (ref 36.0–46.0)
Hemoglobin: 13 g/dL (ref 12.0–15.0)
LYMPHS ABS: 2.5 10*3/uL (ref 0.7–4.0)
LYMPHS PCT: 35 % (ref 12–46)
MCH: 31 pg (ref 26.0–34.0)
MCHC: 34.1 g/dL (ref 30.0–36.0)
MCV: 90.9 fL (ref 78.0–100.0)
Monocytes Absolute: 0.8 10*3/uL (ref 0.1–1.0)
Monocytes Relative: 11 % (ref 3–12)
NEUTROS ABS: 3.6 10*3/uL (ref 1.7–7.7)
NEUTROS PCT: 49 % (ref 43–77)
PLATELETS: 264 10*3/uL (ref 150–400)
RBC: 4.19 MIL/uL (ref 3.87–5.11)
RDW: 12.4 % (ref 11.5–15.5)
WBC: 7.2 10*3/uL (ref 4.0–10.5)

## 2013-05-06 MED ORDER — ALUM & MAG HYDROXIDE-SIMETH 200-200-20 MG/5ML PO SUSP: 30.0000 mL | ORAL | Status: DC | PRN

## 2013-05-06 MED ORDER — ACETAMINOPHEN 325 MG PO TABS: 650.0000 mg | ORAL_TABLET | ORAL | Status: DC | PRN

## 2013-05-06 NOTE — Progress Notes (Signed)
80 yo woman with h/o dementia who was seen over the weekend and started on antibiotics.  Patient became agitated over the last couple days and is refusing to take her medications.  She comes in today with her caretaker who sits with her until 5 and arrives at 9 am.  Patient will eat breakfast, but is very unsteady on her feet.  Patient can dress self.  Nobody has come by during the day despite my call to adult and protective services.    Patient complains of headache and dislike of her daughter.  Objective:  Patient upset with caretaker HEENT:  Healing frontal laceration, small abrasion midline frontal hairline with slight subcutaneous swelling.  Mild depression from surgery right upper maxilla. Oroph:  Edentulous, uvula symmetric TM's:  Normal with adequate hearing Neck:  Supple, no adenopathy, no bruits heard Chest:  Clear, tender right ribs. Heart:  Reg, no murmur heard Abdomen:  Soft, nontender Ext:  No edema, shuffling gait Neuro:  Can recall our visit last Saturday, CN III-XII normal, Patient thinks no one is helping her.  No asymmetry of extremity movement.  Assessment:  Patient is not able to care for self at this point with her anxiety, anger, and unstable gait.  I believe it is time for an intervention to prevent some unwitnessed fall or catastrophe at home when left unattended.  The headache is unexplained, but may be related to stress, since recent CT scan of head was without acute findings.  Plan:  Evaluate at ED with psych and neurology.  Consideration to admission vs nursing home placement. I am unable to arrange home evaluation after trying for over a week.  Signed, Robyn Haber, MD

## 2013-05-06 NOTE — ED Notes (Signed)
Per pt/care giver-was sent her by PCP for eval, caregiver states she is not taking meds

## 2013-05-06 NOTE — ED Provider Notes (Signed)
CSN: [id number]     Arrival date & time 05/06/13  1654 History  This chart was scribed for non-physician practitioner, Alecia Lemming, PA-C working with Carmin Muskrat, MD by Frederich Balding, ED scribe. This patient was seen in room WTR3/WLPT3 and the patient's care was started at 6:09 PM.   Chief Complaint  Patient presents with  . Medical Clearance   The history is provided by the patient and medical records. No language interpreter was used.   HPI Comments: Ariana [name] is a 80 y.o. female who presents to the Emergency Department for medical clearance. She went to her PCP earlier today (Dr. Joseph Art, note in [facility]) to be seen for a headache, increased agitation. PCP is concerned that patient is unable to care for herself in current state. Patient has home aide during the day. Recommends eval by psych/neuro, possible placement. Pt was sent here by her PCP for evaluation. Her caregiver states she has been noncompliant with her medications. She states the pt has also become more agitated recently. Pt was treated for a UTI one week ago. Recent neg head CT. Denies fever. Level V caveat 2/2 dementia.   Past Medical History  Diagnosis Date  . Sjogren's syndrome   . Dry eye syndrome   . Hypertension, benign   . Mitral valve prolapse   . GERD (gastroesophageal reflux disease)   . Diverticulosis of colon   . Irritable bowel syndrome   . Urinary incontinence   . Low back pain syndrome   . Fibromyalgia   . Memory loss   . Anxiety and depression   . History of adverse drug reaction   . Peripheral neuropathy     "both feet and legs"  . Shortness of breath 07/18/11    "alot lately"  . Anginal pain   . History of recurrent UTIs   . H/O hiatal hernia   . Anxiety   . Dementia   . Depression   . Abnormality of gait   . Thyroid nodule   . Headache(784.0) 09/05/2012  . Adenomatous polyp of colon 2002    69mm   Past Surgical History  Procedure Laterality Date  . Vesicovaginal fistula closure w/  [place]    . Appendectomy    . Cholecystectomy    . Mandible surgery    . Temporomandibular joint surgery    . Cataract extraction, bilateral    . Abdominal hysterectomy    . Dental surgery      multiple tooth extractions  . Esophagogastroduodenoscopy (egd) with esophageal dilation N/A 08/23/2012    Procedure: ESOPHAGOGASTRODUODENOSCOPY (EGD) WITH ESOPHAGEAL DILATION;  Surgeon: Milus Banister, MD;  Location: [facility];  Service: Endoscopy;  Laterality: N/A;  . Colonoscopy w/ biopsies      multiple   Family History  Problem Relation Age of Onset  . Colon cancer Sister   . Heart disease Father   . Pneumonia Mother   . Heart attack Mother   . Throat cancer Brother    History  Substance Use Topics  . Smoking status: Former Research scientist (life sciences)  . Smokeless tobacco: Never Used  . Alcohol Use: No   OB History   Grav Para Term Preterm Abortions TAB SAB Ect Mult Living                 Review of Systems  Unable to perform ROS: Dementia  Constitutional: Negative for fever.  Neurological: Positive for headaches.   Allergies  Codeine; Doxycycline; Klonopin; Meperidine hcl; Naproxen; Norflex; Oxycodone-acetaminophen; Penicillins; Phenothiazines;  Propoxyphene hcl; Stelazine; Sulfamethoxazole-trimethoprim; Tolectin; Tramadol; Zoloft; and Lubiprostone  Home Medications   Current Outpatient Rx  Name  Route  Sig  Dispense  Refill  . ALPRAZolam (XANAX) 0.5 MG tablet   Oral   Take 1 tablet (0.5 mg total) by mouth 2 (two) times daily as needed for anxiety.   60 tablet   5   . aspirin EC 81 MG tablet   Oral   Take 81 mg by mouth daily.         Marland Kitchen azithromycin (ZITHROMAX Z-PAK) 250 MG tablet      Take as directed on pack   6 tablet   0   . Calcium Carbonate-Vitamin D (CALTRATE 600+D) 600-400 MG-UNIT per tablet   Oral   Take 2 tablets by mouth daily.           . carboxymethylcellulose (REFRESH TEARS) 0.5 % SOLN      1 drop 2 (two) times daily.           . cholecalciferol (VITAMIN  D) 1000 UNITS tablet   Oral   Take 1,000 Units by mouth daily.         Marland Kitchen CRANBERRY PO   Oral   Take 2 capsules by mouth daily.         Marland Kitchen diltiazem (CARDIZEM CD) 120 MG 24 hr capsule      TAKE ONE CAPSULE BY MOUTH EVERY DAY   30 capsule   5   . donepezil (ARICEPT) 5 MG tablet   Oral   Take 1 tablet (5 mg total) by mouth at bedtime.         . DULoxetine (CYMBALTA) 60 MG capsule   Oral   Take 60 mg by mouth daily.         Marland Kitchen estrogens, conjugated, (PREMARIN) 0.45 MG tablet   Oral   Take 0.45 mg by mouth daily. Take daily for 21 days then do not take for 7 days.         . Flaxseed, Linseed, (FLAXSEED OIL) 1000 MG CAPS   Oral   Take 1 capsule by mouth 2 (two) times daily.         . fluconazole (DIFLUCAN) 150 MG tablet   Oral   Take 1 tablet (150 mg total) by mouth once.   1 tablet   0   . folic acid (FOLVITE) 1 MG tablet      TAKE 1 TABLET TWICE A DAY   60 tablet   6   . furosemide (LASIX) 20 MG tablet      TAKE 1 TABLET BY MOUTH EVERY MORNING AS NEEDED FOR SWELLING   30 tablet   6   . HYDROcodone-acetaminophen (NORCO/VICODIN) 5-325 MG per tablet      TAKE 1 TABLET 3 TIMES A DAY AS NEEDED FOR PAIN MAX 3 PER DAY   90 tablet   0   . hydrocortisone (ANUSOL-HC) 2.5 % rectal cream      Apply to rectal area after each BM   30 g   11   . hydroxychloroquine (PLAQUENIL) 200 MG tablet   Oral   Take 200 mg by mouth daily. As directed by Dr. Estanislado Pandy          . lansoprazole (PREVACID) 30 MG capsule      TAKE ONE CAPSULE BY MOUTH EVERY DAY   30 capsule   6   . lisinopril (PRINIVIL,ZESTRIL) 20 MG tablet      TAKE 1 TABLET BY MOUTH  EVERY DAY   30 tablet   6   . metoprolol (LOPRESSOR) 50 MG tablet      TAKE 1/2 TABLET BY MOUTH TWICE A DAY   30 tablet   7   . mirabegron ER (MYRBETRIQ) 50 MG TB24   Oral   Take 50 mg by mouth daily.         . Multiple Vitamin (MULTIVITAMIN) tablet   Oral   Take 1 tablet by mouth daily.           Marland Kitchen  nystatin (MYCOSTATIN) 100000 UNIT/ML suspension      1 teaspoonful to gargle and swallow 4 times daily as needed   180 mL   5   . Omega-3 Fatty Acids (FISH OIL) 1000 MG CAPS   Oral   Take 1 capsule by mouth daily.           . pilocarpine (SALAGEN) 5 MG tablet   Oral   Take 5 mg by mouth 2 (two) times daily. As directed by Dr. Estanislado Pandy          . polyethylene glycol (MIRALAX / GLYCOLAX) packet   Oral   Take 17 g by mouth daily.          . potassium chloride (K-DUR) 10 MEQ tablet   Oral   Take 1 tablet (10 mEq total) by mouth daily.   10 tablet   2   . sodium chloride (OCEAN) 0.65 % SOLN nasal spray   Nasal   Place 1 spray into the nose as needed for congestion.   30 mL   0    BP 113/95  Pulse 59  Temp(Src) 98.5 F (36.9 C) (Oral)  Resp 16  Ht 5' 3.5" (1.613 m)  Wt 105 lb (47.628 kg)  BMI 18.31 kg/m2  SpO2 97%  Physical Exam  Nursing note and vitals reviewed. Constitutional: She is oriented to person, place, and time. She appears well-developed and well-nourished. No distress.  HENT:  Head: Normocephalic and atraumatic.  Right Ear: Tympanic membrane, external ear and ear canal normal.  Left Ear: Tympanic membrane, external ear and ear canal normal.  Nose: Nose normal.  Mouth/Throat: Uvula is midline, oropharynx is clear and moist and mucous membranes are normal.  Eyes: Conjunctivae, EOM and lids are normal. Pupils are equal, round, and reactive to light. Right eye exhibits no discharge. Left eye exhibits no discharge. Right eye exhibits no nystagmus. Left eye exhibits no nystagmus.  Neck: Normal range of motion. Neck supple. No tracheal deviation present.  Cardiovascular: Normal rate, regular rhythm and normal heart sounds.   Pulmonary/Chest: Effort normal and breath sounds normal. No respiratory distress. She has no wheezes. She has no rales.  Abdominal: Soft. There is no tenderness.  Musculoskeletal: Normal range of motion.       Cervical back: She  exhibits normal range of motion, no tenderness and no bony tenderness.  Neurological: She is alert and oriented to person, place, and time. She has normal strength and normal reflexes. No cranial nerve deficit or sensory deficit. She displays a negative Romberg sign. Coordination and gait normal. GCS eye subscore is 4. GCS verbal subscore is 5. GCS motor subscore is 6.  Skin: Skin is warm and dry.  Psychiatric: She has a normal mood and affect. Her behavior is normal.    ED Course  Procedures (including critical care time)  DIAGNOSTIC STUDIES: Oxygen Saturation is 97% on RA, normal by my interpretation.    COORDINATION OF CARE: 6:14 PM-Discussed  treatment plan which includes lab work and chest xray with pt at bedside and pt agreed to plan.   Labs Review Labs Reviewed  BASIC METABOLIC PANEL - Abnormal; Notable for the following:    Glucose, Bld 107 (*)    GFR calc non Af Amer 83 (*)    All other components within normal limits  CBC WITH DIFFERENTIAL  URINALYSIS, ROUTINE W REFLEX MICROSCOPIC   Imaging Review Dg Chest 2 View  05/06/2013   CLINICAL DATA:  Agitation  EXAM: CHEST  2 VIEW  COMPARISON:  Prior radiograph from 05/03/2013  FINDINGS: The cardiac and mediastinal silhouettes are stable in size and contour, and remain within normal limits.  The lungs are hyperinflated with attenuation of the pulmonary markings, compatible with emphysema. Oblique linear opacity within the medial left lower lobe is most compatible with scarring, unchanged. No airspace consolidation, pleural effusion, or pulmonary edema is identified. There is no pneumothorax.  No acute osseous abnormality identified.  IMPRESSION: Emphysema.  No acute cardiopulmonary abnormality.   Electronically Signed   By: Jeannine Boga M.D.   On: 05/06/2013 18:51     EKG Interpretation None      Patient seen and examined. Work-up initiated. Medications ordered. D/w Dr. Vanita Panda. Pending TTS consult.   Vital signs  reviewed and are as follows: Filed Vitals:   05/06/13 1722  BP: 113/95  Pulse: 59  Temp: 98.5 F (36.9 C)  Resp: 16    Date: 05/06/2013  Rate: 68  Rhythm: normal sinus rhythm  QRS Axis: normal  Intervals: normal  ST/T Wave abnormalities: normal  Conduction Disutrbances:none  Narrative Interpretation:   Old EKG Reviewed: unchanged  8:42 PM Patient medically cleared. Awaiting TTS.     MDM   Final diagnoses:  None    Awaiting psych consult.   I personally performed the services described in this documentation, which was scribed in my presence. The recorded information has been reviewed and is accurate.    Carlisle Cater, PA-C 05/06/13 2045

## 2013-05-07 NOTE — ED Provider Notes (Addendum)
  Medical screening examination/treatment/procedure(s) were performed by non-physician practitioner and as supervising physician I was immediately available for consultation/collaboration.  This was a shared encounter.  On my exam the patient was awake, alert, oriented, but irritated.   EKG Interpretation   Date/Time:  Tuesday May 06 2013 19:29:41 EDT Ventricular Rate:  68 PR Interval:  194 QRS Duration: 91 QT Interval:  455 QTC Calculation: 484 R Axis:   -55 Text Interpretation:  Age not entered, assumed to be  80 years old for  purpose of ECG interpretation Sinus rhythm Left anterior fascicular block  Abnormal R-wave progression, early transition Borderline T abnormalities,  anterior leads Borderline prolonged QT interval Sinus rhythm Artifact T  wave abnormality Abnormal ekg Confirmed by Carmin Muskrat  MD ([id number]) on  05/06/2013 11:17:19 PM       12:00 AM I discussed the patient's case with our behavioral health team at length. The patient is appropriate for discharge, and family will pursue outpatient placement while other concerns are addressed with her primary care physician.  Carmin Muskrat, MD 05/07/13 0001  Carmin Muskrat, MD 05/07/13 [phone]

## 2013-05-07 NOTE — Discharge Instructions (Signed)
Please be sure to follow up with her primary care physician and take advantage of the resources provided to you by our behavioral health team.  Return here for concerning changes in your condition.

## 2013-05-07 NOTE — BH Assessment (Signed)
Tele Assessment Note   Ariana [name] is a 80 y.o. female who voluntarily presents with daughter for medical clearance.  Pt brought in due to recent changes in behavior, she has increased agitation, verbal/physical combativeness towards family and private duty nursing staff([facility]) and her recent memory is decompensating, daily.  Pt.'s daughter states behaviors have been escalating for 1 month. Pt.'s primary care physician suggested that pt be evaluated at the [facility].  During interview, pt is agitated, irritable, speaking loudly and calling her daughter a Control and instrumentation engineer.  Pt ha been dx with dementia, but is able to correctly answer the date and current president.  Pt.'s daughter states she saw a major change in pt.'s mental status when son died in 2010-06-13.  Pt.'s daughter doesn't feel that pt is a danger to others or self but feels that needs more extensive care with an assisted living facility or nursing with memory care unit.  Pt denies SI/HI\/AVH.  Pt.'s daughter is Medical laboratory scientific officer and states that she has been contemplating find a more suitable environment for pt as she is afraid when she is alone that may do harm to herself by leaving the oven on or wandering away from home when caregivers are not in the home.  This Probation officer discussed disposition with Dr. Vanita Panda and he agreed to d/c pt home with daughter.        Axis I: Dementia  Axis II: Deferred Axis III:  Past Medical History  Diagnosis Date  . Sjogren's syndrome   . Dry eye syndrome   . Hypertension, benign   . Mitral valve prolapse   . GERD (gastroesophageal reflux disease)   . Diverticulosis of colon   . Irritable bowel syndrome   . Urinary incontinence   . Low back pain syndrome   . Fibromyalgia   . Memory loss   . Anxiety and depression   . History of adverse drug reaction   . Peripheral neuropathy     "both feet and legs"  . Shortness of breath 07/18/11    "alot lately"  . Anginal pain   . History of recurrent UTIs   . H/O  hiatal hernia   . Anxiety   . Dementia   . Depression   . Abnormality of gait   . Thyroid nodule   . Headache(784.0) 09/05/2012  . Adenomatous polyp of colon 06/12/00    19mm   Axis IV: other psychosocial or environmental problems, problems related to social environment and problems with primary support group Axis V: 41-50 serious symptoms  Past Medical History:  Past Medical History  Diagnosis Date  . Sjogren's syndrome   . Dry eye syndrome   . Hypertension, benign   . Mitral valve prolapse   . GERD (gastroesophageal reflux disease)   . Diverticulosis of colon   . Irritable bowel syndrome   . Urinary incontinence   . Low back pain syndrome   . Fibromyalgia   . Memory loss   . Anxiety and depression   . History of adverse drug reaction   . Peripheral neuropathy     "both feet and legs"  . Shortness of breath 07/18/11    "alot lately"  . Anginal pain   . History of recurrent UTIs   . H/O hiatal hernia   . Anxiety   . Dementia   . Depression   . Abnormality of gait   . Thyroid nodule   . Headache(784.0) 09/05/2012  . Adenomatous polyp of colon 12-Jun-2000  41mm    Past Surgical History  Procedure Laterality Date  . Vesicovaginal fistula closure w/ tah    . Appendectomy    . Cholecystectomy    . Mandible surgery    . Temporomandibular joint surgery    . Cataract extraction, bilateral    . Abdominal hysterectomy    . Dental surgery      multiple tooth extractions  . Esophagogastroduodenoscopy (egd) with esophageal dilation N/A 08/23/2012    Procedure: ESOPHAGOGASTRODUODENOSCOPY (EGD) WITH ESOPHAGEAL DILATION;  Surgeon: Milus Banister, MD;  [facility];  Service: Endoscopy;  Laterality: N/A;  . Colonoscopy w/ biopsies      multiple    Family History:  Family History  Problem Relation Age of Onset  . Colon cancer Sister   . Heart disease Father   . Pneumonia Mother   . Heart attack Mother   . Throat cancer Brother     Social History:  reports that she  has quit smoking. She has never used smokeless tobacco. She reports that she does not drink alcohol or use illicit drugs.  Additional Social History:  Alcohol / Drug Use Pain Medications: See MAR  Prescriptions: See MAR  Over the Counter: See MAR  History of alcohol / drug use?: No history of alcohol / drug abuse Longest period of sobriety (when/how long): None   CIWA: CIWA-Ar BP: 124/68 mmHg Pulse Rate: 62 COWS:    Allergies:  Allergies  Allergen Reactions  . Codeine Nausea Only    unless given with Phenergan  . Doxycycline     Unknown  . Klonopin [Clonazepam]     Causes hallucination   . Meperidine Hcl Nausea Only    unless given with Phenergan  . Naproxen   . Norflex [Orphenadrine Citrate] Nausea Only    Unless given with Phenergan  . Oxycodone-Acetaminophen Nausea Only    unless given with phenergan  . Penicillins     Unknown  . Phenothiazines     Unknown  . Propoxyphene Hcl Nausea Only    unless given with phenergan  . Stelazine     Unknown  . Sulfamethoxazole-Trimethoprim     Unknown  . Tolectin [Tolmetin Sodium]     Unknown  . Tramadol     Unknown  . Zoloft [Sertraline Hcl]     Caused pt to sleep a lot  . Lubiprostone Rash    Home Medications:  (Not in a hospital admission)  OB/GYN Status:  No LMP recorded. Patient has had a hysterectomy.  General Assessment Data [facility]: [facility] Is this a Tele or Face-to-Face Assessment?: Face-to-Face Is this an Initial Assessment or a Re-assessment for this encounter?: Initial Assessment Living Arrangements: Alone;Other (Comment) (Pt has PDN from [place] [facility] ) Can pt return to current living arrangement?: Yes Admission Status: Voluntary Is patient capable of signing voluntary admission?: Yes Transfer from: [facility] Referral Source: MD  Medical Screening Exam ([place]) Medical Exam completed: No Reason for MSE not completed: Other: (None )  [facility] Crisis Care Plan Living  Arrangements: Alone;Other (Comment) (Pt has PDN from [facility] ) Name of Psychiatrist: Dr. Casimiro Needle  Name of Therapist: None   Education Status Is patient currently in school?: No Current Grade: None  Highest grade of school patient has completed: None  Name of school: None  Contact person: None   Risk to self Suicidal Ideation: No Suicidal Intent: No Is patient at risk for suicide?: No Suicidal Plan?: No Access to Means: No What  has been your use of drugs/alcohol within the last 12 months?: Pt denies  Previous Attempts/Gestures: No How many times?: 0 Other Self Harm Risks: None  Triggers for Past Attempts: None known Intentional Self Injurious Behavior: None Family Suicide History: No Recent stressful life event(s): Other (Comment);Recent negative physical changes (Increased agitation due to dementia; recent falls) Persecutory voices/beliefs?: No Depression: No Depression Symptoms:  (None reported) Substance abuse history and/or treatment for substance abuse?: No  Risk to Others Homicidal Ideation: No Thoughts of Harm to Others: No Current Homicidal Intent: No Current Homicidal Plan: No Access to Homicidal Means: No Identified Victim: None  History of harm to others?: No Assessment of Violence: None Noted Violent Behavior Description: None  Does patient have access to weapons?: No Criminal Charges Pending?: No Does patient have a court date: No  Psychosis Hallucinations: None noted Delusions: None noted  Mental Status Report Appear/Hygiene: Disheveled Eye Contact: Good Motor Activity: Unsteady;Agitation;Restlessness Speech: Loud;Logical/coherent;Aggressive Level of Consciousness: Alert;Irritable Mood: Anxious;Irritable Affect: Anxious;Irritable Anxiety Level: Moderate Thought Processes: Relevant Judgement: Impaired Orientation: Person;Place;Time;Situation Obsessive Compulsive Thoughts/Behaviors: Moderate  Cognitive Functioning Concentration:  Decreased Memory: Remote Intact;Recent Impaired IQ: Average Insight: Fair Impulse Control: Fair Appetite: Good Weight Loss: 0 Weight Gain: 0 Sleep: No Change Total Hours of Sleep: 5 Vegetative Symptoms: None  ADLScreening [facility] Assessment Services) Patient's cognitive ability adequate to safely complete daily activities?: Yes Patient able to express need for assistance with ADLs?: Yes Independently performs ADLs?: Yes (appropriate for developmental age)  Prior Inpatient Therapy Prior Inpatient Therapy: No Prior Therapy Dates: None  Prior Therapy Facilty/Provider(s): None  Reason for Treatment: None   Prior Outpatient Therapy Prior Outpatient Therapy: Yes Prior Therapy Dates: Current  Prior Therapy Facilty/Provider(s): Dr. Casimiro Needle  Reason for Treatment: Med Mgt   ADL Screening (condition at time of admission) Patient's cognitive ability adequate to safely complete daily activities?: Yes Is the patient deaf or have difficulty hearing?: No Does the patient have difficulty seeing, even when wearing glasses/contacts?: No Does the patient have difficulty concentrating, remembering, or making decisions?: Yes Patient able to express need for assistance with ADLs?: Yes Does the patient have difficulty dressing or bathing?: No Independently performs ADLs?: Yes (appropriate for developmental age) Does the patient have difficulty walking or climbing stairs?: No Weakness of Legs: None Weakness of Arms/Hands: None  Home Assistive Devices/Equipment Home Assistive Devices/Equipment: None  Therapy Consults (therapy consults require a physician order) PT Evaluation Needed: No OT Evalulation Needed: No SLP Evaluation Needed: No Abuse/Neglect Assessment (Assessment to be complete while patient is alone) Physical Abuse: Denies Verbal Abuse: Denies Sexual Abuse: Denies Exploitation of patient/patient's resources: Denies Self-Neglect: Denies Values / Beliefs Cultural Requests During  Hospitalization: None Spiritual Requests During Hospitalization: None Consults Spiritual Care Consult Needed: No Social Work Consult Needed: No Regulatory affairs officer (For Healthcare) Advance Directive: Patient does not have advance directive;Patient would not like information Pre-existing out of facility DNR order (yellow form or pink MOST form): No Nutrition Screen- [facility] Adult/[facility] Patient's home diet: Regular  Additional Information 1:1 In Past 12 Months?: Yes (Pt has pdn with [facility] ) CIRT Risk: No Elopement Risk: No Does patient have medical clearance?: Yes     Disposition:  Disposition Initial Assessment Completed for this Encounter: Yes Disposition of Patient: Outpatient treatment;Referred to (Current provider locale nursing and assisted living facility) Type of outpatient treatment: Adult Patient referred to: Other (Comment) (Referred to current provider and nursing/assisted living fac)  [place] Rm 05/07/2013 12:53 AM

## 2013-05-08 ENCOUNTER — Telehealth: Payer: Self-pay | Admitting: Pulmonary Disease

## 2013-05-08 NOTE — Telephone Encounter (Signed)
ATC, line was busy, WCB

## 2013-05-09 NOTE — Telephone Encounter (Signed)
Called, spoke with pt's daughter.  Pt was seen in the ED for confusion.  Daughter reports they advised her to f/u with PCP in 1 day. She is scheduled to see Hollace Kinnier in June. We have scheduled pt to see TP on Monday, March 30 at 2:30 pm. Karinda ok with this and voiced no further questions or concerns at this time.

## 2013-05-12 ENCOUNTER — Ambulatory Visit: Payer: Medicare Other | Admitting: Adult Health

## 2013-05-19 ENCOUNTER — Telehealth: Payer: Self-pay

## 2013-05-19 NOTE — Telephone Encounter (Signed)
TIA WITH [facility] WOULD LIKE TO SPEAK WITH DR KURT'S ASST REGARDING PT. [place] [phone]

## 2013-05-21 ENCOUNTER — Telehealth: Payer: Self-pay | Admitting: Neurology

## 2013-05-21 NOTE — Telephone Encounter (Signed)
Would you like [place] to call or do you want to personally speak to them?

## 2013-05-21 NOTE — Telephone Encounter (Signed)
I called the patient. She has had some recent falls. The gabapentin was reduced and discontinued. The patient has had some increase in the pain. The daughter indicates that she is having increased aggitation, she will not let the family help her. The family is concerned that she is getting progressive dementia, with short-term memory issues. The patient still is on some Cymbalta, but increased doses more than 60 mg resulted in too many side effects. The patient is verbally abusing her caretakers, and they will not stay around long. At some point, the patient may need to transfer to an assisted living situation. The balance situation is important, and adding medications for pain may worsen this issue.

## 2013-05-21 NOTE — Telephone Encounter (Signed)
Spoke with patient and scheduled and confirmed for sooner appt., both feet are numb but the left caused her to fall

## 2013-05-21 NOTE — Telephone Encounter (Signed)
Pt called states she her left foot seems to keep causing pt to fall, she has broken some ribs and bruising ribs from these falls. Pt is wanting to see if she can get in to see Dr. Jannifer Franklin concerning this matter. Pt does have an apt w/CM on 06/05/13 but pt states she needs to be seen sooner and wants to see Dr. Jannifer Franklin. Pt states she also has had a bone density test done a couple of months ago by Dr. Nori Riis. Thanks

## 2013-05-22 ENCOUNTER — Other Ambulatory Visit (HOSPITAL_COMMUNITY): Payer: Self-pay | Admitting: Pulmonary Disease

## 2013-05-22 ENCOUNTER — Ambulatory Visit (INDEPENDENT_AMBULATORY_CARE_PROVIDER_SITE_OTHER): Payer: Medicare Other | Admitting: Nurse Practitioner

## 2013-05-22 ENCOUNTER — Encounter: Payer: Self-pay | Admitting: Nurse Practitioner

## 2013-05-22 VITALS — BP 136/53 | HR 64 | Ht 62.0 in | Wt 109.0 lb

## 2013-05-22 DIAGNOSIS — F0390 Unspecified dementia without behavioral disturbance: Secondary | ICD-10-CM

## 2013-05-22 DIAGNOSIS — G609 Hereditary and idiopathic neuropathy, unspecified: Secondary | ICD-10-CM | POA: Insufficient documentation

## 2013-05-22 DIAGNOSIS — R269 Unspecified abnormalities of gait and mobility: Secondary | ICD-10-CM

## 2013-05-22 NOTE — Telephone Encounter (Signed)
Spoke with Daughter Saraiyah. She reports pt will be seeing Dr. Hollace Kinnier in June. In the meantime she wants to know if SN will refill pt norco. Please advise SN thanks

## 2013-05-22 NOTE — Progress Notes (Signed)
I have read the note, and I agree with the clinical assessment and plan.  Chanya Chrisley K Lamiracle Chaidez   

## 2013-05-22 NOTE — Patient Instructions (Addendum)
The results of your EMG nerve conduction done by Dr. Jannifer Franklin back in October shows a peripheral neuropathy which is fairly severe. This is causing you to have problems with your ability to ambulate. I recommend that you get some physical therapy. You have stated today that you already know someone that has called you previously. If this does not work out for him we can set up a physical therapy evaluation through neuro rehabilitation I also have some information on peripheral neuropathy for you and your daughter to read Your memory score is stable today Followup in 6 months

## 2013-05-22 NOTE — Progress Notes (Signed)
[facility]  PATIENT: Ariana [name] DOB: 02/03/1930   REASON FOR VISIT: follow up for Peripheral neuropathy, mild cognitive dysfunction   HISTORY OF PRESENT ILLNESS:Ms. Mcgrory, 80 year old returns for followup. She was last seen by Dr. Jannifer Franklin 11/29/12. At that time she was complaining of numbness in the feet with some pain going up into the legs on the right side. EMG nerve conduction obtained 10/23/ 2014 shows evidence of a primarily axonal peripheral neuropathy of moderate severity, there was no clear evidence of an underlying lumbosacral radiculopathy on the right. She reports today that she's had several falls since last seen she is using  a quad cane. She has CNA services 8 hours a day privately. She is alone at todays visit, she did not want her CNA to come back. She was 40 minutes late for her appointment. She returns for reevaluation  HISTORY: mild memory disturbance. The patient was on Aricept, but she was having some diarrhea. The patient was taken off of this medication, but it never helped her bowel issues. The patient continues to complain of numbness in both feet, and some pain going up the legs above the knee on the right. The patient walks with a cane, and she denies any falls since last seen. The patient does have some low back pain. The patient returns to this office for further evaluation.  REVIEW OF SYSTEMS: Full 14 system review of systems performed and notable only for those listed, all others are neg:  Constitutional: Activity change, fatigue Cardiovascular: N/A  Ear/Nose/Throat: N/A  Skin: N/A  Eyes:  light sensitivity Respiratory: N/A  Gastroitestinal: N/A  Hematology/Lymphatic: N/A  Endocrine: N/A Musculoskeletal:N/A  Allergy/Immunology: N/A  Neurological: Left leg weakness, memory loss  Psychiatric: N/A   ALLERGIES: Allergies  Allergen Reactions  . Codeine Nausea Only    unless given with Phenergan  . Doxycycline     Unknown  .  Klonopin [Clonazepam]     Causes hallucination   . Meperidine Hcl Nausea Only    unless given with Phenergan  . Naproxen   . Norflex [Orphenadrine Citrate] Nausea Only    Unless given with Phenergan  . Oxycodone-Acetaminophen Nausea Only    unless given with phenergan  . Penicillins     Unknown  . Phenothiazines     Unknown  . Propoxyphene Hcl Nausea Only    unless given with phenergan  . Stelazine     Unknown  . Sulfamethoxazole-Trimethoprim     Unknown  . Tolectin [Tolmetin Sodium]     Unknown  . Tramadol     Unknown  . Zoloft [Sertraline Hcl]     Caused pt to sleep a lot  . Lubiprostone Rash    HOME MEDICATIONS: Outpatient Prescriptions Prior to Visit  Medication Sig Dispense Refill  . ALPRAZolam (XANAX) 0.5 MG tablet Take 1 tablet (0.5 mg total) by mouth 2 (two) times daily as needed for anxiety.  60 tablet  5  . aspirin EC 81 MG tablet Take 81 mg by mouth daily.      . carboxymethylcellulose (REFRESH TEARS) 0.5 % SOLN 1 drop 2 (two) times daily.        Marland Kitchen diltiazem (CARDIZEM CD) 120 MG 24 hr capsule TAKE ONE CAPSULE BY MOUTH EVERY DAY  30 capsule  5  . donepezil (ARICEPT) 5 MG tablet Take 1 tablet (5 mg total) by mouth at bedtime.      . DULoxetine (CYMBALTA) 60 MG capsule Take 60 mg by mouth  daily.      Marland Kitchen HYDROcodone-acetaminophen (NORCO/VICODIN) 5-325 MG per tablet TAKE 1 TABLET 3 TIMES A DAY AS NEEDED FOR PAIN MAX 3 PER DAY  90 tablet  0  . hydroxychloroquine (PLAQUENIL) 200 MG tablet Take 200 mg by mouth. As directed by Dr. Estanislado Pandy      . lansoprazole (PREVACID) 30 MG capsule TAKE ONE CAPSULE BY MOUTH EVERY DAY  30 capsule  6  . lisinopril (PRINIVIL,ZESTRIL) 20 MG tablet TAKE 1 TABLET BY MOUTH EVERY DAY  30 tablet  6  . metoprolol (LOPRESSOR) 50 MG tablet TAKE 1/2 TABLET BY MOUTH TWICE A DAY  30 tablet  7  . mirabegron ER (MYRBETRIQ) 50 MG TB24 Take 50 mg by mouth daily.      . pilocarpine (SALAGEN) 5 MG tablet Take 5 mg by mouth 2 (two) times daily. As directed  by Dr. Estanislado Pandy       . polyethylene glycol (MIRALAX / GLYCOLAX) packet Take 17 g by mouth daily.       . potassium chloride (K-DUR) 10 MEQ tablet Take 1 tablet (10 mEq total) by mouth daily.  10 tablet  2  . sodium chloride (OCEAN) 0.65 % SOLN nasal spray Place 1 spray into the nose as needed for congestion.  30 mL  0  . azithromycin (ZITHROMAX) 250 MG tablet Take as directed on pack       No facility-administered medications prior to visit.    PAST MEDICAL HISTORY: Past Medical History  Diagnosis Date  . Sjogren's syndrome   . Dry eye syndrome   . Hypertension, benign   . Mitral valve prolapse   . GERD (gastroesophageal reflux disease)   . Diverticulosis of colon   . Irritable bowel syndrome   . Urinary incontinence   . Low back pain syndrome   . Fibromyalgia   . Memory loss   . Anxiety and depression   . History of adverse drug reaction   . Peripheral neuropathy     "both feet and legs"  . Shortness of breath 07/18/11    "alot lately"  . Anginal pain   . History of recurrent UTIs   . H/O hiatal hernia   . Anxiety   . Dementia   . Depression   . Abnormality of gait   . Thyroid nodule   . Headache(784.0) 09/05/2012  . Adenomatous polyp of colon 2002    76mm    PAST SURGICAL HISTORY: Past Surgical History  Procedure Laterality Date  . Vesicovaginal fistula closure w/ tah    . Appendectomy    . Cholecystectomy    . Mandible surgery    . Temporomandibular joint surgery    . Cataract extraction, bilateral    . Abdominal hysterectomy    . Dental surgery      multiple tooth extractions  . Esophagogastroduodenoscopy (egd) with esophageal dilation N/A 08/23/2012    Procedure: ESOPHAGOGASTRODUODENOSCOPY (EGD) WITH ESOPHAGEAL DILATION;  Surgeon: Milus Banister, MD;  Location: [facility];  Service: Endoscopy;  Laterality: N/A;  . Colonoscopy w/ biopsies      multiple    FAMILY HISTORY: Family History  Problem Relation Age of Onset  . Colon cancer Sister   . Heart  disease Father   . Pneumonia Mother   . Heart attack Mother   . Throat cancer Brother     SOCIAL HISTORY: History   Social History  . Marital Status: Widowed    Spouse Name: N/A    Number of Children: 2  .  Years of Education: N/A   Occupational History  . Retired    Social History Main Topics  . Smoking status: Former Research scientist (life sciences)  . Smokeless tobacco: Never Used  . Alcohol Use: No  . Drug Use: No  . Sexual Activity: No   Other Topics Concern  . 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    Patient is Widowed.    Patient has 2 children.    Patient is retired.               PHYSICAL EXAM  Filed Vitals:   05/22/13 1136  BP: 136/53  Pulse: 64  Height: 5\' 2"  (1.575 m)  Weight: 109 lb (49.442 kg)   Body mass index is 19.93 kg/(m^2).  Generalized: Well developed, frail-appearing female in no acute distress  Musculoskeletal: No deformity , no significant peripheral edema  Neurological examination   Mentation: Alert oriented to time, place, history taking. MMSE 30/30. AFT 15, Follows all commands speech and language fluent  Cranial nerve II-XII: Pupils were equal round reactive to light extraocular movements were full, visual field were full on confrontational test. Facial sensation and strength were normal. hearing was intact to finger rubbing bilaterally. Uvula tongue midline. head turning and shoulder shrug were normal and symmetric.Tongue protrusion into cheek strength was normal. Motor: normal bulk and tone, full strength in the BUE, BLE,  No focal weakness Sensory: Decreased pinprick to about the ankle bilaterally, decreased vibratory to toes   Coordination: finger-nose-finger, heel-to-shin bilaterally, no dysmetria Reflexes: Depressed upper and lower and symmetric, plantar responses were flexor bilaterally. Gait and Station: Rising up from seated position without assistance, wide based gait with quad cane, able  to walk on heels and toes, unsteady with tandem gait. Romberg negative   DIAGNOSTIC DATA (LABS, IMAGING, TESTING) - I reviewed patient records, labs, notes, testing and imaging myself where available.  Lab Results  Component Value Date   WBC 7.2 05/06/2013   HGB 13.0 05/06/2013   HCT 38.1 05/06/2013   MCV 90.9 05/06/2013   PLT 264 05/06/2013      Component Value Date/Time   NA 138 05/06/2013 1845   K 3.8 05/06/2013 1845   CL 98 05/06/2013 1845   CO2 28 05/06/2013 1845   GLUCOSE 107* 05/06/2013 1845   BUN 14 05/06/2013 1845   CREATININE 0.61 05/06/2013 1845   CREATININE 0.69 05/03/2013 1119   CALCIUM 10.3 05/06/2013 1845   PROT 6.6 05/03/2013 1119   PROT 6.6 12/05/2012 1420   ALBUMIN 4.1 05/03/2013 1119   AST 22 05/03/2013 1119   ALT 16 05/03/2013 1119   ALKPHOS 62 05/03/2013 1119   BILITOT 0.6 05/03/2013 1119   GFRNONAA 83* 05/06/2013 1845   GFRAA >90 05/06/2013 1845    Lab Results  Component Value Date   VITAMINB12 780 12/05/2012    ASSESSMENT AND PLAN  80 y.o. year old female  has a past medical history of Sjogren's syndrome; Dry eye syndrome; mild memory disturbance, axonal peripheral neuropathy of moderate severity. Gait abnormality  The results of your EMG nerve conduction done by Dr. Jannifer Franklin back in October shows a peripheral neuropathy which is fairly severe. This is causing you to have problems with your ability to ambulate. I recommend that you get some physical therapy due to the continued falls. You have stated today that you already know someone that has called you previously. If this does not work out for  him we can set up a physical therapy evaluation through neuro rehabilitation I also gave  some information on peripheral neuropathy for you and your daughter to read Your memory score is stable today Followup in 6 months Patient was 40 minutes late for her appointment and made aware that she would not be seen in the future if she was late. Dennie Bible, [facility], [name],  APRN  [facility] [address], [place], [place] [address] [phone]

## 2013-05-26 MED ORDER — HYDROCODONE-ACETAMINOPHEN 5-325 MG PO TABS: ORAL_TABLET | ORAL | Status: DC

## 2013-05-26 NOTE — Telephone Encounter (Signed)
Per SN---  Ok to refill the norco.  rx has been printed out and will place up front once this has been signed by SN.  thanks

## 2013-06-05 ENCOUNTER — Telehealth: Payer: Self-pay

## 2013-06-05 ENCOUNTER — Ambulatory Visit: Payer: Medicare Other | Admitting: Nurse Practitioner

## 2013-06-05 NOTE — Telephone Encounter (Signed)
Needs to reschedule referral appointment to later in the day - due to [place] not coming to help her until 9 am   [phone]

## 2013-06-05 NOTE — Telephone Encounter (Signed)
LMOM that letter is up front for p/u

## 2013-06-05 NOTE — Telephone Encounter (Signed)
Patient daughter stated she is returning a phone call concerning her mother. [phone]

## 2013-06-05 NOTE — Telephone Encounter (Signed)
PT CAME BY WITH A JURY SUMMONS FOR DR. L TO WRITE HER A LETTER TO EXCUSE HER.  SHE NEEDS TO HAVE THIS LETTER IN ASAP.  JURY DATE IS MAY 5 AND SHE HAS TO HAVE THE LETTER IN 10 DAYS PRIOR TO THAT.  PLEASE CALL WHEN READY TO PICK UP.  [phone]  THE FORM WILL BE AT THE NURSES STATION.

## 2013-06-05 NOTE — Telephone Encounter (Signed)
Letter written to remove patient from jury duty summons.

## 2013-06-05 NOTE — Telephone Encounter (Signed)
See other phone message--pt's daughter notified

## 2013-06-12 ENCOUNTER — Other Ambulatory Visit (HOSPITAL_COMMUNITY): Payer: Self-pay | Admitting: Interventional Radiology

## 2013-06-12 DIAGNOSIS — E041 Nontoxic single thyroid nodule: Secondary | ICD-10-CM

## 2013-06-18 ENCOUNTER — Other Ambulatory Visit: Payer: Medicare Other

## 2013-06-18 ENCOUNTER — Inpatient Hospital Stay: Admission: RE | Admit: 2013-06-18 | Payer: Medicare Other | Source: Ambulatory Visit

## 2013-06-18 ENCOUNTER — Other Ambulatory Visit: Payer: Self-pay | Admitting: Pulmonary Disease

## 2013-06-18 ENCOUNTER — Ambulatory Visit
Admission: RE | Admit: 2013-06-18 | Discharge: 2013-06-18 | Disposition: A | Discharge status: Home / Self Care | Payer: Medicare Other | Source: Ambulatory Visit | Attending: Pulmonary Disease | Admitting: Pulmonary Disease

## 2013-06-18 ENCOUNTER — Other Ambulatory Visit (HOSPITAL_COMMUNITY)
Admission: RE | Admit: 2013-06-18 | Discharge: 2013-06-18 | Disposition: A | Discharge status: Home / Self Care | Payer: Medicare Other | Source: Ambulatory Visit | Attending: Pulmonary Disease | Admitting: Pulmonary Disease

## 2013-06-18 DIAGNOSIS — E042 Nontoxic multinodular goiter: Secondary | ICD-10-CM | POA: Insufficient documentation

## 2013-06-18 DIAGNOSIS — E041 Nontoxic single thyroid nodule: Secondary | ICD-10-CM

## 2013-06-20 ENCOUNTER — Encounter: Payer: Self-pay | Admitting: *Deleted

## 2013-06-26 ENCOUNTER — Encounter: Payer: Self-pay | Admitting: Internal Medicine

## 2013-06-30 ENCOUNTER — Encounter: Payer: Self-pay | Admitting: Internal Medicine

## 2013-06-30 ENCOUNTER — Ambulatory Visit (INDEPENDENT_AMBULATORY_CARE_PROVIDER_SITE_OTHER): Payer: Medicare Other | Admitting: Internal Medicine

## 2013-06-30 VITALS — BP 120/70 | HR 64 | Ht 65.5 in | Wt 109.0 lb

## 2013-06-30 DIAGNOSIS — R002 Palpitations: Secondary | ICD-10-CM | POA: Insufficient documentation

## 2013-06-30 DIAGNOSIS — I059 Rheumatic mitral valve disease, unspecified: Secondary | ICD-10-CM

## 2013-06-30 DIAGNOSIS — E7800 Pure hypercholesterolemia, unspecified: Secondary | ICD-10-CM

## 2013-06-30 DIAGNOSIS — I10 Essential (primary) hypertension: Secondary | ICD-10-CM

## 2013-06-30 MED ORDER — METOPROLOL TARTRATE 50 MG PO TABS
50.0000 mg | ORAL_TABLET | Freq: Two times a day (BID) | ORAL | Status: DC
Start: 2013-06-30 — End: 2014-06-29

## 2013-06-30 MED ORDER — LISINOPRIL 10 MG PO TABS: 10.0000 mg | ORAL_TABLET | Freq: Every day | ORAL | Status: DC

## 2013-06-30 NOTE — Progress Notes (Signed)
OFFICE NOTE  Chief Complaint:  Palpitations, routine follow-up  Primary Care Physician: Robyn Haber, MD  HPI:  Ariana [name] an 80 year old female with history of some confusion in the past, hypertension and Sjogren syndrome. I had added lisinopril to her regimen which has helped control her blood pressure much better. She does report good control over her blood pressure over the past year; however, has had recent worsening problems such as cough, new productive sputum, problems with urinary tract infections. Weight is up back to 126 pounds after dip to 116 pounds but this is about where she is normally with a good appetite. Blood pressure is noted to be mildly elevated today; however, she is in some discomfort and has been short of breath with productive cough. Denies any chest pain. She does have pain in her back that shoots to her right thigh when sitting or leaning down. Today she has a number of other complaints. Her main issue is palpitations which she reports she had an episode of fast heart rate for almost one month but it has somewhat resolved. She still gets some recurrent palpitations.  PMHx:  Past Medical History  Diagnosis Date  . Sjogren's syndrome   . Dry eye syndrome   . Hypertension, benign   . Mitral valve prolapse   . GERD (gastroesophageal reflux disease)   . Diverticulosis of colon   . Irritable bowel syndrome   . Urinary incontinence   . Low back pain syndrome   . Fibromyalgia   . Memory loss   . Anxiety and depression   . History of adverse drug reaction   . Peripheral neuropathy     "both feet and legs"  . Shortness of breath 07/18/11    "alot lately"  . Anginal pain   . History of recurrent UTIs   . H/O hiatal hernia   . Anxiety   . Dementia   . Depression   . Abnormality of gait   . Thyroid nodule   . Headache(784.0) 09/05/2012  . Adenomatous polyp of colon 2002    54mm    Past Surgical History  Procedure Laterality Date  . Vesicovaginal  fistula closure w/ tah    . Appendectomy    . Cholecystectomy  2000  . Mandible surgery    . Temporomandibular joint surgery  1986  . Cataract extraction, bilateral    . Abdominal hysterectomy  1967  . Dental surgery      multiple tooth extractions  . Esophagogastroduodenoscopy (egd) with esophageal dilation N/A 08/23/2012    Procedure: ESOPHAGOGASTRODUODENOSCOPY (EGD) WITH ESOPHAGEAL DILATION;  Surgeon: Milus Banister, MD;  Location: [facility];  Service: Endoscopy;  Laterality: N/A;  . Colonoscopy w/ biopsies      multiple  . Nasal septum surgery  1980  . Transthoracic echocardiogram  2001    mild LVH, normal LV  . Nm myocar perf wall motion  2003    persantine - normal static and dynamic study w/apical thinning and presvered LV function, no ischemia  . Cardiac catheterization  02/17/2003    normal L main, LAD free of disease, Cfx free of disease, RCA free of disease (Dr. Rockne Menghini)    FAMHx:  Family History  Problem Relation Age of Onset  . Colon cancer Sister   . Heart disease Father     heart attack  . Pneumonia Mother   . Heart attack Mother   . Hypertension Mother   . Throat cancer Brother   . Hypertension Maternal  Grandmother     SOCHx:   reports that she has quit smoking. She has never used smokeless tobacco. She reports that she does not drink alcohol or use illicit drugs.  ALLERGIES:  Allergies  Allergen Reactions  . Codeine Nausea Only    unless given with Phenergan  . Doxycycline     Unknown  . Klonopin [Clonazepam]     Causes hallucination   . Meperidine Hcl Nausea Only    unless given with Phenergan  . Naproxen   . Norflex [Orphenadrine Citrate] Nausea Only    Unless given with Phenergan  . Oxycodone-Acetaminophen Nausea Only    unless given with phenergan  . Penicillins     Unknown  . Phenothiazines     Unknown  . Propoxyphene Hcl Nausea Only    unless given with phenergan  . Stelazine     Unknown  . Sulfamethoxazole-Trimethoprim      Unknown  . Tolectin [Tolmetin Sodium]     Unknown  . Tramadol     Unknown  . Zoloft [Sertraline Hcl]     Caused pt to sleep a lot  . Lubiprostone Rash    ROS: A comprehensive review of systems was negative except for: Cardiovascular: positive for palpitations  HOME MEDS: Current Outpatient Prescriptions  Medication Sig Dispense Refill  . ALPRAZolam (XANAX) 0.5 MG tablet Take 1 tablet (0.5 mg total) by mouth 2 (two) times daily as needed for anxiety.  60 tablet  5  . aspirin EC 81 MG tablet Take 81 mg by mouth daily.      . carboxymethylcellulose (REFRESH TEARS) 0.5 % SOLN 1 drop 2 (two) times daily.        Marland Kitchen diltiazem (CARDIZEM CD) 120 MG 24 hr capsule TAKE ONE CAPSULE BY MOUTH EVERY DAY  30 capsule  3  . donepezil (ARICEPT) 5 MG tablet Take 1 tablet (5 mg total) by mouth at bedtime.      . DULoxetine (CYMBALTA) 60 MG capsule Take 60 mg by mouth daily.      Marland Kitchen HYDROcodone-acetaminophen (NORCO/VICODIN) 5-325 MG per tablet Take 1 tablet by mouth 2 (two) times daily as needed. MAX 3 tablets PER DAY      . Hydrocortisone (ANUSOL-HC RE) Place rectally as needed.      . hydroxychloroquine (PLAQUENIL) 200 MG tablet Take 200 mg by mouth. As directed by Dr. Estanislado Pandy      . lansoprazole (PREVACID) 30 MG capsule TAKE ONE CAPSULE BY MOUTH EVERY DAY  30 capsule  6  . lisinopril (PRINIVIL,ZESTRIL) 10 MG tablet Take 1 tablet (10 mg total) by mouth daily.  30 tablet  11  . metoprolol (LOPRESSOR) 50 MG tablet Take 1 tablet (50 mg total) by mouth 2 (two) times daily.  60 tablet  11  . pilocarpine (SALAGEN) 5 MG tablet Take 5 mg by mouth 2 (two) times daily. As directed by Dr. Estanislado Pandy       . polyethylene glycol (MIRALAX / GLYCOLAX) packet Take 17 g by mouth daily.       . sodium chloride (OCEAN) 0.65 % SOLN nasal spray Place 1 spray into the nose as needed for congestion.  30 mL  0   No current facility-administered medications for this visit.    LABS/IMAGING: No results found for this or any  previous visit (from the past 48 hour(s)). No results found.  VITALS: BP 120/70  Pulse 64  Ht 5' 5.5" (1.664 m)  Wt 109 lb (49.442 kg)  BMI 17.86 kg/m2  EXAM: General appearance: alert and no distress Neck: no carotid bruit and no JVD Lungs: clear to auscultation bilaterally Heart: regular rate and rhythm, S1, S2 normal, no murmur, click, rub or gallop Abdomen: soft, non-tender; bowel sounds normal; no masses,  no organomegaly Extremities: extremities normal, atraumatic, no cyanosis or edema Pulses: 2+ and symmetric Skin: Skin color, texture, turgor normal. No rashes or lesions Neurologic: Grossly normal Psych: Mood, affect normal  EKG: Sinus rhythm with PACs at 64, nonspecific ST and T changes  ASSESSMENT: 1. Hypertension-controlled 2. Chest wall pain 3. Palpitations 4. Sjogren syndrome 5. Mild memory loss  PLAN: 1.   Ms. Mcfate is having more palpitations. I recommend increasing her Toprol to one full tablet twice daily. She will need to decrease her lisinopril to 10 mg daily to compensate as her blood pressure is normal. Follow-up annually.  Pixie Casino, MD, [facility] Attending Cardiologist [place] 06/30/2013, 3:41 PM

## 2013-06-30 NOTE — Patient Instructions (Signed)
Your physician has recommended you make the following change in your medication... - DECREASE LISINOPRIL TO 10MG  ONCE DAILY.  - INCREASE METOPROLOL TARTRATE TO 1 TABLET (50MG ) BY MOUTH TWICE DAILY.   Your physician wants you to follow-up in: 1 YEAR. You will receive a reminder letter in the mail two months in advance. If you don't receive a letter, please call our office to schedule the follow-up appointment.

## 2013-07-02 ENCOUNTER — Telehealth: Payer: Self-pay | Admitting: Internal Medicine

## 2013-07-02 NOTE — Telephone Encounter (Signed)
Pt was seen by Dr Debara Pickett on Monday,06-30-13. She would like to talk to you about some changes he made in her medicine.

## 2013-07-02 NOTE — Telephone Encounter (Signed)
Spoke to daughter, Ariana [name].  Ariana [name] states she has POA,  Ariana [name] states she was not able to come to her mothers appointment 2 days ago.  She states her mother has dementia,and stated that the doctor had change some of her medication, patient picked up from the pharmacy , but could not tell her daughter what was different.Ariana [name] states she had her husband to get the new medication from her mother's house. Ariana [name] has patient's medication- -LISINOPRIL 10 MG DAILY, increase Metopropol tartrate 50 mg  twice a day. RN confirmed that was correct .RN suggest to Ariana [name] to become her mother's proxy for [facility] CHART.   Since patient sees several doctors in the [place].Ariana [name] states she does not have the papers that was given to her mother at the end of her office visit. RN will mail copy of AVS.

## 2013-07-14 ENCOUNTER — Telehealth: Payer: Self-pay | Admitting: Pulmonary Disease

## 2013-07-14 ENCOUNTER — Telehealth: Payer: Self-pay

## 2013-07-14 NOTE — Telephone Encounter (Signed)
Pt has two norco 5/325 rxs at front for pick up. One is dated April 21, 2013 for #90 x 0.  The second is dated for May 26, 2013  #90 x 0. Looking back in chart, daughter was aware both rxs were placed at front on the given days. I have VOIDED the April 21, 2013 rx and placed in shred bin. May 26, 2013 rx is still at front for pick up. Pt has a scheduled appt with Hollace Kinnier on August 07, 2013.

## 2013-07-14 NOTE — Telephone Encounter (Signed)
Patient calling to ask if someone can get her a refill on her hydrocodone today or tomorrow if possible. She see's Dr. Joseph Art and has made an appt with him on 06/04 Thursday . Patient says she will run out of her pain medication tomorrow and will be a whole day without it. She is aware Dr. Carlean Jews is off today and tomorrow. Patient says she see's Dr. Leeanne Deed at [place] her pcp. She says since he has suddenly retired, she has no one else to fill this medication. Please advise and see previous telephone messages from Dr. Daneil Dolin office.   Best number to reach patient is now: [phone]  She says her son can come by and pick up her medication when it is ready for pick up.

## 2013-07-15 NOTE — Telephone Encounter (Signed)
Pt CB to check status of RF request. Advised pt that Dr L will not be in until tomorrow afternoon, but according to message in EPIC she has a Rx ready to p/up at Dr Jeannine Kitten office. Pt agreed to go and p/up the Rx from them and will plan to see Dr L at appt on Thurs as planned.

## 2013-07-15 NOTE — Telephone Encounter (Signed)
Patient returned call that she missed. Please return call. Thank you.

## 2013-07-15 NOTE — Telephone Encounter (Signed)
Left message to return call 

## 2013-07-15 NOTE — Telephone Encounter (Signed)
Left message for patient to call back  

## 2013-07-17 ENCOUNTER — Encounter: Payer: Self-pay | Admitting: Family Medicine

## 2013-07-17 ENCOUNTER — Ambulatory Visit (INDEPENDENT_AMBULATORY_CARE_PROVIDER_SITE_OTHER): Payer: Medicare Other | Admitting: Family Medicine

## 2013-07-17 ENCOUNTER — Telehealth: Payer: Self-pay

## 2013-07-17 ENCOUNTER — Other Ambulatory Visit: Payer: Self-pay | Admitting: Family Medicine

## 2013-07-17 VITALS — BP 96/52 | HR 60 | Temp 98.0°F | Resp 16 | Wt 108.0 lb

## 2013-07-17 DIAGNOSIS — M949 Disorder of cartilage, unspecified: Secondary | ICD-10-CM

## 2013-07-17 DIAGNOSIS — R51 Headache: Secondary | ICD-10-CM

## 2013-07-17 DIAGNOSIS — I10 Essential (primary) hypertension: Secondary | ICD-10-CM

## 2013-07-17 DIAGNOSIS — M8580 Other specified disorders of bone density and structure, unspecified site: Secondary | ICD-10-CM | POA: Insufficient documentation

## 2013-07-17 DIAGNOSIS — M3500 Sicca syndrome, unspecified: Secondary | ICD-10-CM

## 2013-07-17 DIAGNOSIS — M899 Disorder of bone, unspecified: Secondary | ICD-10-CM

## 2013-07-17 MED ORDER — TOPIRAMATE 25 MG PO TABS: 25.0000 mg | ORAL_TABLET | Freq: Two times a day (BID) | ORAL | Status: DC

## 2013-07-17 NOTE — Telephone Encounter (Signed)
Patient would like to know if we could call her in a refill of Nystatin for her throat because it is dry. She uses the [facility] on [address] Pt's number [phone]

## 2013-07-17 NOTE — Progress Notes (Signed)
80 yo woman here for refills and recent headaches that are frontal, radiating bilaterally to temporal areas.  Patient describes a long history of eye problems:  First diagnosed with "dry eyes", then cataracts, then Sjogren's.  She had a tear duct plug inserted in left side.  She is seeing Dr. Estanislado Pandy for Sjogren's.    Objective:  NAD Both eye lids are reddened with some injection of the sclerae bilaterally.  Wt Readings from Last 3 Encounters:  07/17/13 108 lb (48.988 kg)  06/30/13 109 lb (49.442 kg)  05/22/13 109 lb (49.442 kg)   TM's:  Mildly opaque, bright red at umbo on right Neck: supple, no adenop, no thyromegaly, no bruits Oroph:  Clear Chest:  Clear Skin: clear Ext:  No edema.  BP Readings from Last 3 Encounters:  07/17/13 96/52  06/30/13 120/70  05/22/13 136/53    I listened to patient's myriad complaints for 30 minutes.  Assessment:    Osteopenia  Headache(784.0) - Plan: topiramate (TOPAMAX) 25 MG tablet  Sjogren's syndrome  Signed, Robyn Haber, MD

## 2013-07-17 NOTE — Telephone Encounter (Signed)
Dr L, you just saw pt today, don't know if you discussed this? Please advise.

## 2013-07-18 ENCOUNTER — Other Ambulatory Visit: Payer: Self-pay | Admitting: Family Medicine

## 2013-07-18 DIAGNOSIS — R682 Dry mouth, unspecified: Secondary | ICD-10-CM

## 2013-07-18 MED ORDER — NYSTATIN 100000 UNIT/ML MT SUSP: 5.0000 mL | Freq: Four times a day (QID) | OROMUCOSAL | Status: DC

## 2013-07-20 ENCOUNTER — Ambulatory Visit (INDEPENDENT_AMBULATORY_CARE_PROVIDER_SITE_OTHER): Payer: Medicare Other | Admitting: Family Medicine

## 2013-07-20 ENCOUNTER — Encounter (HOSPITAL_COMMUNITY): Payer: Self-pay | Admitting: Emergency Medicine

## 2013-07-20 ENCOUNTER — Inpatient Hospital Stay (HOSPITAL_COMMUNITY)
Admission: EM | Admit: 2013-07-20 | Discharge: 2013-07-22 | DRG: 689 | Disposition: A | Discharge status: Home Health | Payer: Medicare Other | Attending: Internal Medicine | Admitting: Internal Medicine

## 2013-07-20 ENCOUNTER — Telehealth: Payer: Self-pay

## 2013-07-20 ENCOUNTER — Emergency Department (HOSPITAL_COMMUNITY): Payer: Medicare Other

## 2013-07-20 VITALS — BP 158/82 | HR 58 | Temp 97.2°F | Resp 16 | Ht 63.5 in | Wt 105.6 lb

## 2013-07-20 DIAGNOSIS — Z8249 Family history of ischemic heart disease and other diseases of the circulatory system: Secondary | ICD-10-CM

## 2013-07-20 DIAGNOSIS — N390 Urinary tract infection, site not specified: Principal | ICD-10-CM | POA: Diagnosis present

## 2013-07-20 DIAGNOSIS — Z87440 Personal history of urinary (tract) infections: Secondary | ICD-10-CM

## 2013-07-20 DIAGNOSIS — K5730 Diverticulosis of large intestine without perforation or abscess without bleeding: Secondary | ICD-10-CM | POA: Diagnosis present

## 2013-07-20 DIAGNOSIS — G589 Mononeuropathy, unspecified: Secondary | ICD-10-CM

## 2013-07-20 DIAGNOSIS — Z87891 Personal history of nicotine dependence: Secondary | ICD-10-CM

## 2013-07-20 DIAGNOSIS — I059 Rheumatic mitral valve disease, unspecified: Secondary | ICD-10-CM

## 2013-07-20 DIAGNOSIS — R159 Full incontinence of feces: Secondary | ICD-10-CM | POA: Diagnosis present

## 2013-07-20 DIAGNOSIS — M8580 Other specified disorders of bone density and structure, unspecified site: Secondary | ICD-10-CM

## 2013-07-20 DIAGNOSIS — M542 Cervicalgia: Secondary | ICD-10-CM

## 2013-07-20 DIAGNOSIS — E43 Unspecified severe protein-calorie malnutrition: Secondary | ICD-10-CM | POA: Insufficient documentation

## 2013-07-20 DIAGNOSIS — R9431 Abnormal electrocardiogram [ECG] [EKG]: Secondary | ICD-10-CM | POA: Diagnosis present

## 2013-07-20 DIAGNOSIS — M545 Low back pain, unspecified: Secondary | ICD-10-CM

## 2013-07-20 DIAGNOSIS — F329 Major depressive disorder, single episode, unspecified: Secondary | ICD-10-CM | POA: Diagnosis present

## 2013-07-20 DIAGNOSIS — N318 Other neuromuscular dysfunction of bladder: Secondary | ICD-10-CM | POA: Diagnosis present

## 2013-07-20 DIAGNOSIS — F341 Dysthymic disorder: Secondary | ICD-10-CM

## 2013-07-20 DIAGNOSIS — E7800 Pure hypercholesterolemia, unspecified: Secondary | ICD-10-CM

## 2013-07-20 DIAGNOSIS — E042 Nontoxic multinodular goiter: Secondary | ICD-10-CM

## 2013-07-20 DIAGNOSIS — M3500 Sicca syndrome, unspecified: Secondary | ICD-10-CM | POA: Diagnosis present

## 2013-07-20 DIAGNOSIS — T887XXA Unspecified adverse effect of drug or medicament, initial encounter: Secondary | ICD-10-CM

## 2013-07-20 DIAGNOSIS — E876 Hypokalemia: Secondary | ICD-10-CM | POA: Diagnosis present

## 2013-07-20 DIAGNOSIS — Z9849 Cataract extraction status, unspecified eye: Secondary | ICD-10-CM

## 2013-07-20 DIAGNOSIS — R51 Headache: Secondary | ICD-10-CM

## 2013-07-20 DIAGNOSIS — Z800 Family history of malignant neoplasm of digestive organs: Secondary | ICD-10-CM

## 2013-07-20 DIAGNOSIS — K219 Gastro-esophageal reflux disease without esophagitis: Secondary | ICD-10-CM

## 2013-07-20 DIAGNOSIS — K5900 Constipation, unspecified: Secondary | ICD-10-CM | POA: Diagnosis present

## 2013-07-20 DIAGNOSIS — Z681 Body mass index (BMI) 19 or less, adult: Secondary | ICD-10-CM

## 2013-07-20 DIAGNOSIS — F3289 Other specified depressive episodes: Secondary | ICD-10-CM | POA: Diagnosis present

## 2013-07-20 DIAGNOSIS — R269 Unspecified abnormalities of gait and mobility: Secondary | ICD-10-CM

## 2013-07-20 DIAGNOSIS — I10 Essential (primary) hypertension: Secondary | ICD-10-CM | POA: Diagnosis present

## 2013-07-20 DIAGNOSIS — R1013 Epigastric pain: Secondary | ICD-10-CM

## 2013-07-20 DIAGNOSIS — R413 Other amnesia: Secondary | ICD-10-CM

## 2013-07-20 DIAGNOSIS — Z860100 Personal history of colon polyps, unspecified: Secondary | ICD-10-CM

## 2013-07-20 DIAGNOSIS — R002 Palpitations: Secondary | ICD-10-CM

## 2013-07-20 DIAGNOSIS — F0390 Unspecified dementia without behavioral disturbance: Secondary | ICD-10-CM | POA: Diagnosis present

## 2013-07-20 DIAGNOSIS — R32 Unspecified urinary incontinence: Secondary | ICD-10-CM | POA: Diagnosis present

## 2013-07-20 DIAGNOSIS — K589 Irritable bowel syndrome without diarrhea: Secondary | ICD-10-CM | POA: Diagnosis present

## 2013-07-20 DIAGNOSIS — H04129 Dry eye syndrome of unspecified lacrimal gland: Secondary | ICD-10-CM

## 2013-07-20 DIAGNOSIS — N3941 Urge incontinence: Secondary | ICD-10-CM | POA: Diagnosis present

## 2013-07-20 DIAGNOSIS — F411 Generalized anxiety disorder: Secondary | ICD-10-CM | POA: Diagnosis present

## 2013-07-20 DIAGNOSIS — G609 Hereditary and idiopathic neuropathy, unspecified: Secondary | ICD-10-CM

## 2013-07-20 DIAGNOSIS — I498 Other specified cardiac arrhythmias: Secondary | ICD-10-CM | POA: Diagnosis present

## 2013-07-20 DIAGNOSIS — IMO0001 Reserved for inherently not codable concepts without codable children: Secondary | ICD-10-CM

## 2013-07-20 LAB — COMPREHENSIVE METABOLIC PANEL
ALT: 57 U/L — ABNORMAL HIGH (ref 0–35)
AST: 46 U/L — ABNORMAL HIGH (ref 0–37)
Albumin: 4.1 g/dL (ref 3.5–5.2)
Alkaline Phosphatase: 96 U/L (ref 39–117)
BILIRUBIN TOTAL: 0.7 mg/dL (ref 0.3–1.2)
BUN: 12 mg/dL (ref 6–23)
CHLORIDE: 101 meq/L (ref 96–112)
CO2: 29 mEq/L (ref 19–32)
Calcium: 10.1 mg/dL (ref 8.4–10.5)
Creatinine, Ser: 0.62 mg/dL (ref 0.50–1.10)
GFR calc non Af Amer: 82 mL/min — ABNORMAL LOW (ref >90)
Glucose, Bld: 91 mg/dL (ref 70–99)
Potassium: 2.9 mEq/L — CL (ref 3.7–5.3)
Sodium: 142 mEq/L (ref 137–147)
Total Protein: 7.2 g/dL (ref 6.0–8.3)

## 2013-07-20 LAB — CBC WITH DIFFERENTIAL/PLATELET
Basophils Absolute: 0 10*3/uL (ref 0.0–0.1)
Basophils Relative: 0 % (ref 0–1)
EOS ABS: 0.1 10*3/uL (ref 0.0–0.7)
Eosinophils Relative: 1 % (ref 0–5)
HEMATOCRIT: 37.7 % (ref 36.0–46.0)
HEMOGLOBIN: 12.9 g/dL (ref 12.0–15.0)
Lymphocytes Relative: 32 % (ref 12–46)
Lymphs Abs: 2.1 10*3/uL (ref 0.7–4.0)
MCH: 31.1 pg (ref 26.0–34.0)
MCHC: 34.2 g/dL (ref 30.0–36.0)
MCV: 90.8 fL (ref 78.0–100.0)
MONO ABS: 0.6 10*3/uL (ref 0.1–1.0)
MONOS PCT: 10 % (ref 3–12)
NEUTROS ABS: 3.7 10*3/uL (ref 1.7–7.7)
Neutrophils Relative %: 57 % (ref 43–77)
Platelets: 192 10*3/uL (ref 150–400)
RBC: 4.15 MIL/uL (ref 3.87–5.11)
RDW: 12.8 % (ref 11.5–15.5)
WBC: 6.5 10*3/uL (ref 4.0–10.5)

## 2013-07-20 LAB — URINALYSIS, ROUTINE W REFLEX MICROSCOPIC
BILIRUBIN URINE: NEGATIVE
Glucose, UA: NEGATIVE mg/dL
Hgb urine dipstick: NEGATIVE
KETONES UR: NEGATIVE mg/dL
NITRITE: NEGATIVE
Protein, ur: NEGATIVE mg/dL
Specific Gravity, Urine: 1.003 — ABNORMAL LOW (ref 1.005–1.030)
UROBILINOGEN UA: 0.2 mg/dL (ref 0.0–1.0)
pH: 7.5 (ref 5.0–8.0)

## 2013-07-20 LAB — POCT CBC
Granulocyte percent: 55 %G (ref 37–80)
HCT, POC: 39.6 % (ref 37.7–47.9)
Hemoglobin: 12.5 g/dL (ref 12.2–16.2)
Lymph, poc: 2 (ref 0.6–3.4)
MCH, POC: 30 pg (ref 27–31.2)
MCHC: 31.6 g/dL — AB (ref 31.8–35.4)
MCV: 94.9 fL (ref 80–97)
MID (cbc): 0.4 (ref 0–0.9)
MPV: 8.3 fL (ref 0–99.8)
POC Granulocyte: 3 (ref 2–6.9)
POC LYMPH PERCENT: 37.4 %L (ref 10–50)
POC MID %: 7.6 %M (ref 0–12)
Platelet Count, POC: 236 10*3/uL (ref 142–424)
RBC: 4.17 M/uL (ref 4.04–5.48)
RDW, POC: 13.2 %
WBC: 5.4 10*3/uL (ref 4.6–10.2)

## 2013-07-20 LAB — POCT URINALYSIS DIPSTICK
Bilirubin, UA: NEGATIVE
Blood, UA: NEGATIVE
Glucose, UA: NEGATIVE
Ketones, UA: NEGATIVE
Nitrite, UA: NEGATIVE
Protein, UA: NEGATIVE
Spec Grav, UA: 1.015
Urobilinogen, UA: 0.2
pH, UA: 7

## 2013-07-20 LAB — LIPASE, BLOOD: LIPASE: 41 U/L (ref 11–59)

## 2013-07-20 LAB — TYPE AND SCREEN
ABO/RH(D): A POS
Antibody Screen: NEGATIVE

## 2013-07-20 LAB — POC OCCULT BLOOD, ED: Fecal Occult Bld: NEGATIVE

## 2013-07-20 LAB — MAGNESIUM: Magnesium: 1.9 mg/dL (ref 1.5–2.5)

## 2013-07-20 LAB — GLUCOSE, POCT (MANUAL RESULT ENTRY): POC Glucose: 92 mg/dl (ref 70–99)

## 2013-07-20 LAB — ABO/RH: ABO/RH(D): A POS

## 2013-07-20 LAB — URINE MICROSCOPIC-ADD ON

## 2013-07-20 MED ORDER — TOPIRAMATE 25 MG PO TABS
25.0000 mg | ORAL_TABLET | Freq: Two times a day (BID) | ORAL | Status: DC
  Administered 2013-07-21 – 2013-07-22 (×3): 25 mg via ORAL
  Filled 2013-07-20 (×4): qty 1

## 2013-07-20 MED ORDER — LISINOPRIL 10 MG PO TABS: 10.0000 mg | ORAL_TABLET | Freq: Every day | ORAL | Status: DC

## 2013-07-20 MED ORDER — ASPIRIN EC 81 MG PO TBEC
81.0000 mg | DELAYED_RELEASE_TABLET | Freq: Every day | ORAL | Status: DC
  Administered 2013-07-21 – 2013-07-22 (×2): 81 mg via ORAL
  Filled 2013-07-20 (×2): qty 1

## 2013-07-20 MED ORDER — HYDROCODONE-ACETAMINOPHEN 5-325 MG PO TABS
1.0000 | ORAL_TABLET | Freq: Two times a day (BID) | ORAL | Status: DC | PRN
  Administered 2013-07-20 – 2013-07-21 (×3): 1 via ORAL
  Filled 2013-07-20 (×4): qty 1

## 2013-07-20 MED ORDER — DEXTROSE 5 % IV SOLN: 1.0000 g | INTRAVENOUS | Status: DC

## 2013-07-20 MED ORDER — SODIUM CHLORIDE 0.9 % IV SOLN
1000.0000 mL | Freq: Once | INTRAVENOUS | Status: AC
  Administered 2013-07-20: 1000 mL via INTRAVENOUS

## 2013-07-20 MED ORDER — SODIUM CHLORIDE 0.9 % IV SOLN
1000.0000 mL | INTRAVENOUS | Status: DC
  Administered 2013-07-20: 1000 mL via INTRAVENOUS

## 2013-07-20 MED ORDER — POTASSIUM CHLORIDE 10 MEQ/100ML IV SOLN
10.0000 meq | INTRAVENOUS | Status: AC
  Administered 2013-07-20: 10 meq via INTRAVENOUS
  Filled 2013-07-20: qty 100

## 2013-07-20 MED ORDER — SODIUM CHLORIDE 0.9 % IJ SOLN
3.0000 mL | Freq: Two times a day (BID) | INTRAMUSCULAR | Status: DC
  Administered 2013-07-20 – 2013-07-21 (×2): 3 mL via INTRAVENOUS

## 2013-07-20 MED ORDER — PILOCARPINE HCL 5 MG PO TABS
5.0000 mg | ORAL_TABLET | Freq: Two times a day (BID) | ORAL | Status: DC
  Administered 2013-07-21: 5 mg via ORAL
  Filled 2013-07-20 (×2): qty 1

## 2013-07-20 MED ORDER — ALPRAZOLAM 0.5 MG PO TABS
0.5000 mg | ORAL_TABLET | Freq: Two times a day (BID) | ORAL | Status: DC | PRN
  Administered 2013-07-21 (×2): 0.5 mg via ORAL
  Filled 2013-07-20 (×2): qty 1

## 2013-07-20 MED ORDER — DILTIAZEM HCL ER COATED BEADS 120 MG PO CP24
120.0000 mg | ORAL_CAPSULE | Freq: Every day | ORAL | Status: DC
  Administered 2013-07-21 – 2013-07-22 (×2): 120 mg via ORAL
  Filled 2013-07-20 (×2): qty 1

## 2013-07-20 MED ORDER — IOHEXOL 300 MG/ML  SOLN
100.0000 mL | Freq: Once | INTRAMUSCULAR | Status: AC | PRN
  Administered 2013-07-20: 100 mL via INTRAVENOUS

## 2013-07-20 MED ORDER — LISINOPRIL 10 MG PO TABS
10.0000 mg | ORAL_TABLET | Freq: Every day | ORAL | Status: DC
  Administered 2013-07-21 – 2013-07-22 (×2): 10 mg via ORAL
  Filled 2013-07-20 (×2): qty 1

## 2013-07-20 MED ORDER — HYDROCORTISONE 2.5 % RE CREA: TOPICAL_CREAM | Freq: Two times a day (BID) | RECTAL | Status: DC | PRN

## 2013-07-20 MED ORDER — PANTOPRAZOLE SODIUM 40 MG PO TBEC
40.0000 mg | DELAYED_RELEASE_TABLET | Freq: Every day | ORAL | Status: DC
  Administered 2013-07-21 – 2013-07-22 (×2): 40 mg via ORAL
  Filled 2013-07-20 (×2): qty 1

## 2013-07-20 MED ORDER — SODIUM CHLORIDE 0.9 % IV SOLN: INTRAVENOUS | Status: DC

## 2013-07-20 MED ORDER — DONEPEZIL HCL 5 MG PO TABS: 5.0000 mg | ORAL_TABLET | Freq: Every day | ORAL | Status: DC

## 2013-07-20 MED ORDER — CARBOXYMETHYLCELLULOSE SODIUM 0.5 % OP SOLN: 1.0000 [drp] | Freq: Two times a day (BID) | OPHTHALMIC | Status: DC

## 2013-07-20 MED ORDER — CEFTRIAXONE SODIUM 1 G IJ SOLR
1.0000 g | INTRAMUSCULAR | Status: DC
  Administered 2013-07-20: 1 g via INTRAVENOUS
  Filled 2013-07-20: qty 10

## 2013-07-20 MED ORDER — ENOXAPARIN SODIUM 40 MG/0.4ML ~~LOC~~ SOLN
40.0000 mg | Freq: Every day | SUBCUTANEOUS | Status: DC
  Administered 2013-07-20 – 2013-07-21 (×2): 40 mg via SUBCUTANEOUS
  Filled 2013-07-20 (×3): qty 0.4

## 2013-07-20 MED ORDER — HYDRALAZINE HCL 20 MG/ML IJ SOLN
5.0000 mg | Freq: Four times a day (QID) | INTRAMUSCULAR | Status: DC | PRN
  Filled 2013-07-20: qty 0.25

## 2013-07-20 NOTE — ED Notes (Signed)
MD at bedside. 

## 2013-07-20 NOTE — Progress Notes (Signed)
Pt arrived to floor room 1518 via stretcher. VS taken pt oriented to room with no complaints. Gait unsteady pain 0/10, with general weakness. Initial assessment completed, pt guide at bedside, will continue to monitor throughout shift.

## 2013-07-20 NOTE — ED Notes (Signed)
Called to give report. Nurse will call back 

## 2013-07-20 NOTE — ED Notes (Signed)
[name] [phone]

## 2013-07-20 NOTE — ED Notes (Signed)
Patient transported to CT 

## 2013-07-20 NOTE — Progress Notes (Signed)
Is an 80 year old woman comes in with epigastric pain for several days. She is brought in by her neighbors.  Patient rambles on in a confused way.  Objective: Patient appears very frail and has rambling speech pattern. She's talking about things that many years ago. She does know the date and the day as well as location and the people around her. Nevertheless she has a difficult time focusing.  Patient walks with a very unstable gait and needs to hold onto the arm over neighbor.   Objective: No acute distress HEENT: Unremarkable Chest: Clear Heart: Regular with 1/6 systolic murmur Abdomen: Soft minimally tender in the epigastrium and more tender in the right lower quadrant. There is a faint bruit in the mid epigastrium with a pulsatile 3 cm mass. She is very thin so it is possible that the aorta is normal size even though it is rather prominent.  Skin: No rash or jaundice Neurologically: Patient appears to be a little confused, cranial nerves are intact Moving 4 extremities equally  Results for orders placed in visit on 07/20/13  POCT CBC      Result Value Ref Range   WBC 5.4  4.6 - 10.2 K/uL   Lymph, poc 2.0  0.6 - 3.4   POC LYMPH PERCENT 37.4  10 - 50 %L   MID (cbc) 0.4  0 - 0.9   POC MID % 7.6  0 - 12 %M   POC Granulocyte 3.0  2 - 6.9   Granulocyte percent 55.0  37 - 80 %G   RBC 4.17  4.04 - 5.48 M/uL   Hemoglobin 12.5  12.2 - 16.2 g/dL   HCT, POC 39.6  37.7 - 47.9 %   MCV 94.9  80 - 97 fL   MCH, POC 30.0  27 - 31.2 pg   MCHC 31.6 (*) 31.8 - 35.4 g/dL   RDW, POC 13.2     Platelet Count, POC 236  142 - 424 K/uL   MPV 8.3  0 - 99.8 fL  GLUCOSE, POCT (MANUAL RESULT ENTRY)      Result Value Ref Range   POC Glucose 92  70 - 99 mg/dl  POCT URINALYSIS DIPSTICK      Result Value Ref Range   Color, UA yellow     Clarity, UA slightly cloudy     Glucose, UA neg     Bilirubin, UA neg     Ketones, UA neg     Spec Grav, UA 1.015     Blood, UA neg     pH, UA 7.0     Protein, UA  neg     Urobilinogen, UA 0.2     Nitrite, UA neg     Leukocytes, UA small (1+)     Assessment: Given the patient's mental status, pulsatile abdominal mass and tenderness, I think patient needs to go to the emergency room.  I spoke to the daughter about her mother. She is at wits end trying to administer the weeks medicine. Patient has caretaker at home who Dr. says does only want Mrs. Hennings wants.  I spent 25 minutes talking to the patient , the neighbors about needing 1 day or 2 in the hospital. Then I listens to the daughter. for 25 minutes on the phone having her go on and on about her mother and the inability to work out the medication regimen.  This is clearly a dangerous situation for patient to go home.  Signed, Carola Frost.D.

## 2013-07-20 NOTE — ED Notes (Signed)
Pt sent here from Urgent Care with c/o epigastric abdominal pain and altered mental status. Doctor at [facility] reports pt has "pulsatile abdominal mass" and changes in mental status and he thinks she needs to be kept for observation. Pt brought in by her neighbors.

## 2013-07-20 NOTE — H&P (Signed)
[facility] History and Physical  Ariana [name] UXL:[id number] DOB: January 17, 1930 DOA: 07/20/2013  Referring physician: edp PCP: Robyn Haber, MD   Chief Complaint: sent in by PCP for abdomiinal pain and altered mental status.   HPI: Ariana [name] is a 80 y.o. female with prior h/o sjogrens syndrome, hypertension, IBS, urinary incontinence, fibromyalgia, dementia, chronic headaches, comes in after seeing her PCP for elevated blood pressures. As per the patient who is oriented to place , person and time, she reports she woke up all sweaty this morning and her BP was found to be in 200/90's, with headaches. No nausea or vomiting, no chest pain, sob or palpitations. She reports intermittent fecal incontinence and constipaton with her history of IBS. She denies any abdominal pain at this time. Currently her headache has improved. She was seen by her PCP and sent to ED, for evaluation of abdominal pulsatile mass. She underwent CT abdomen and pelvis which did not reveal AAA,. There was some air found in the bladder with unknown etiology, diverticulosis in the sigmoid colon. There was some concern about a fistula from the posterior urinary bladder to the sigmoid colon, but its not clear. She doesn't appear to any complaints with abdominal pain or dysuria. She also underwent CT head without contrast for evaluation of headaches, which was essentially stable when compared to previous exam. Patient does ramble on, very tangential, had to stop her in the middle of the conversation to focus on the questions. She reports she is very concerned about the medications that she is taking and she doesn't feel comfortable taking them. She denies recent falls. No family at bedside. On her routine lab work she was found to be hypokalemic and ua revealed a mild UTI. EKG showed prolonged qt interval. She is referred to medical service for admission for further management.    Review of Systems:  See HPI otherwise  negative.    Past Medical History  Diagnosis Date  . Sjogren's syndrome   . Dry eye syndrome   . Hypertension, benign   . Mitral valve prolapse   . GERD (gastroesophageal reflux disease)   . Diverticulosis of colon   . Irritable bowel syndrome   . Urinary incontinence   . Low back pain syndrome   . Fibromyalgia   . Memory loss   . Anxiety and depression   . History of adverse drug reaction   . Peripheral neuropathy     "both feet and legs"  . Shortness of breath 07/18/11    "alot lately"  . Anginal pain   . History of recurrent UTIs   . H/O hiatal hernia   . Anxiety   . Dementia   . Depression   . Abnormality of gait   . Thyroid nodule   . Headache(784.0) 09/05/2012  . Adenomatous polyp of colon 2002    60mm   Past Surgical History  Procedure Laterality Date  . Vesicovaginal fistula closure w/ tah    . Appendectomy    . Cholecystectomy  2000  . Mandible surgery    . Temporomandibular joint surgery  1986  . Cataract extraction, bilateral    . Abdominal hysterectomy  1967  . Dental surgery      multiple tooth extractions  . Esophagogastroduodenoscopy (egd) with esophageal dilation N/A 08/23/2012    Procedure: ESOPHAGOGASTRODUODENOSCOPY (EGD) WITH ESOPHAGEAL DILATION;  Surgeon: Milus Banister, MD;  Location: [facility] ENDOSCOPY;  Service: Endoscopy;  Laterality: N/A;  . Colonoscopy w/ biopsies  multiple  . Nasal septum surgery  1980  . Transthoracic echocardiogram  2001    mild LVH, normal LV  . Nm myocar perf wall motion  2003    persantine - normal static and dynamic study w/apical thinning and presvered LV function, no ischemia  . Cardiac catheterization  02/17/2003    normal L main, LAD free of disease, Cfx free of disease, RCA free of disease (Dr. Rockne Menghini)   Social History:  reports that she has quit smoking. She has never used smokeless tobacco. She reports that she does not drink alcohol or use illicit drugs.  Allergies  Allergen Reactions  . Codeine Nausea  Only    unless given with Phenergan  . Doxycycline     Unknown  . Klonopin [Clonazepam]     Causes hallucination   . Meperidine Hcl Nausea Only    unless given with Phenergan  . Naproxen   . Norflex [Orphenadrine Citrate] Nausea Only    Unless given with Phenergan  . Oxycodone-Acetaminophen Nausea Only    unless given with phenergan  . Penicillins     Unknown  . Phenothiazines     Unknown  . Propoxyphene Hcl Nausea Only    unless given with phenergan  . Stelazine     Unknown  . Sulfamethoxazole-Trimethoprim     Unknown  . Tolectin [Tolmetin Sodium]     Unknown  . Tramadol     Unknown  . Zoloft [Sertraline Hcl]     Caused pt to sleep a lot  . Lubiprostone Rash    Family History  Problem Relation Age of Onset  . Colon cancer Sister   . Heart disease Father     heart attack  . Pneumonia Mother   . Heart attack Mother   . Hypertension Mother   . Throat cancer Brother   . Hypertension Maternal Grandmother      Prior to Admission medications   Medication Sig Start Date End Date Taking? Authorizing Provider  ALPRAZolam Duanne Moron) 0.5 MG tablet Take 1 tablet (0.5 mg total) by mouth 2 (two) times daily as needed for anxiety. 05/03/13   Robyn Haber, MD  aspirin EC 81 MG tablet Take 81 mg by mouth daily.    Historical Provider, MD  carboxymethylcellulose (REFRESH TEARS) 0.5 % SOLN 1 drop 2 (two) times daily.      Historical Provider, MD  diltiazem (CARDIZEM CD) 120 MG 24 hr capsule TAKE ONE CAPSULE BY MOUTH EVERY DAY 05/22/13   Noralee Space, MD  donepezil (ARICEPT) 5 MG tablet Take 1 tablet (5 mg total) by mouth at bedtime. 11/28/12   Gatha Mayer, MD  DULoxetine (CYMBALTA) 60 MG capsule Take 60 mg by mouth daily.    Historical Provider, MD  HYDROcodone-acetaminophen (NORCO/VICODIN) 5-325 MG per tablet Take 1 tablet by mouth 2 (two) times daily as needed. MAX 3 tablets PER DAY 05/26/13   Noralee Space, MD  Hydrocortisone [facility] RE) Place rectally as needed.     Historical Provider, MD  hydroxychloroquine (PLAQUENIL) 200 MG tablet Take 200 mg by mouth. As directed by Dr. Estanislado Pandy    Historical Provider, MD  lansoprazole (PREVACID) 30 MG capsule TAKE ONE CAPSULE BY MOUTH EVERY DAY 01/06/13   Noralee Space, MD  lisinopril (PRINIVIL,ZESTRIL) 10 MG tablet Take 1 tablet (10 mg total) by mouth daily. 06/30/13   Pixie Casino, MD  metoprolol (LOPRESSOR) 50 MG tablet Take 1 tablet (50 mg total) by mouth 2 (two) times daily. 06/30/13  Pixie Casino, MD  nystatin (MYCOSTATIN) 100000 UNIT/ML suspension Take 5 mLs (500,000 Units total) by mouth 4 (four) times daily. 07/18/13   Robyn Haber, MD  pilocarpine (SALAGEN) 5 MG tablet Take 5 mg by mouth 2 (two) times daily. As directed by Dr. Estanislado Pandy     Historical Provider, MD  polyethylene glycol [facility] Floria Raveling) packet Take 17 g by mouth daily.     Historical Provider, MD  sodium chloride (OCEAN) 0.65 % SOLN nasal spray Place 1 spray into the nose as needed for congestion. 08/23/12   Modena Jansky, MD  topiramate (TOPAMAX) 25 MG tablet Take 1 tablet (25 mg total) by mouth 2 (two) times daily. 07/17/13   Robyn Haber, MD   Physical Exam: Filed Vitals:   07/20/13 2100  BP: 160/78  Pulse:   Temp:   Resp: 13    BP 160/78  Pulse 56  Temp(Src) 97.8 F (36.6 C) (Oral)  Resp 13  Ht 5\' 3"  (1.6 m)  Wt 47.628 kg (105 lb)  BMI 18.60 kg/m2  SpO2 99%  General:  Appears anxious.  Eyes: PERRL, normal lids, irises & conjunctiva Neck: no LAD, masses or thyromegaly Cardiovascular: RRR, no m/r/g. No LE edema. Respiratory: CTA bilaterally, no w/r/r. Normal respiratory effort. Abdomen: scaphoid, soft, NT nd bs+ Skin: no rash or induration seen on limited exam Musculoskeletal: grossly normal tone BUE/BLE Neurologic:she is oriented to place, personand time, speech normal, no facial droop, able to move all extremties, gait was not tested.           Labs on Admission:  Basic Metabolic Panel:  Recent  Labs Lab 07/20/13 1630 07/20/13 2017  NA 142  --   K 2.9*  --   CL 101  --   CO2 29  --   GLUCOSE 91  --   BUN 12  --   CREATININE 0.62  --   CALCIUM 10.1  --   MG  --  1.9   Liver Function Tests:  Recent Labs Lab 07/20/13 1630  AST 46*  ALT 57*  ALKPHOS 96  BILITOT 0.7  PROT 7.2  ALBUMIN 4.1    Recent Labs Lab 07/20/13 1630  LIPASE 41   No results found for this basename: AMMONIA,  in the last 168 hours CBC:  Recent Labs Lab 07/20/13 1300 07/20/13 1630  WBC 5.4 6.5  NEUTROABS  --  3.7  HGB 12.5 12.9  HCT 39.6 37.7  MCV 94.9 90.8  PLT  --  192   Cardiac Enzymes: No results found for this basename: CKTOTAL, CKMB, CKMBINDEX, TROPONINI,  in the last 168 hours  BNP (last 3 results) No results found for this basename: PROBNP,  in the last 8760 hours CBG: No results found for this basename: GLUCAP,  in the last 168 hours  Radiological Exams on Admission: Ct Head Wo Contrast  07/20/2013   CLINICAL DATA:  Altered mental status.  EXAM: CT HEAD WITHOUT CONTRAST  TECHNIQUE: Contiguous axial images were obtained from the base of the skull through the vertex without intravenous contrast.  COMPARISON:  Head CT scan 04/20/2013.  FINDINGS: There is some cortical atrophy and chronic microvascular ischemic change. No evidence of acute intracranial abnormality including infarct, hemorrhage, mass lesion, mass effect, midline shift or abnormal extra-axial fluid collection is identified. 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 The calvarium is intact.  IMPRESSION: No acute finding.  Stable compared to prior exam.   Electronically Signed   By: Inge Rise  M.D.   On: 07/20/2013 18:33   Ct Abdomen Pelvis W Contrast  07/20/2013   CLINICAL DATA:  Epigastric region pain and altered mental status; pulsatile abdominal mass on physical examination  EXAM: CT ABDOMEN AND PELVIS WITH CONTRAST  TECHNIQUE: Multidetector CT imaging of the abdomen and pelvis was performed using the  standard protocol following bolus administration of intravenous contrast. Oral contrast was also administered.  CONTRAST:  152mL OMNIPAQUE IOHEXOL 300 MG/ML  SOLN  COMPARISON:  CT abdomen and pelvis October 18, 2012  FINDINGS: There is mild bibasilar lung scarring. There is no edema or consolidation in the lung bases.  Liver is enlarged, measuring 18.4 cm in length. No focal liver lesions are identified. There is mild intrahepatic and extrahepatic biliary duct dilatation without mass or calculus seen. Gallbladder is absent, and these findings are probably due to post cholecystectomy state. They appear stable compared to the prior study.  Normal bilaterally.  There is a simple cyst in the anterior upper to mid pole left kidney measuring 1.1 by 1.0 cm. There is a stable mass in the left kidney laterally measuring 1.1 by 0.6 cm. This mass has attenuation values higher than is expected with a simple cyst. It is best seen on axial slice 14 series 3. There is a simple cyst in the posterior upper to midpole left kidney measuring 1.0 x 1.0 cm. There is a simple cyst in the lateral mid left kidney measuring 1.2 x 1.0 cm. There are no appreciable renal masses on the right. There is no hydronephrosis on either side. There is no renal or ureteral calculus on either side.  In the pelvis, the urinary bladder is midline and with normal wall thickness. A small amount of air in the urinary bladder is noted. The etiology for this air is uncertain. There is extensive diverticulosis in the sigmoid colon, and there is diffuse muscular hypertrophy in this area. There is no surrounding mesenteric inflammation. Note that several diverticula do abut the urinary bladder. A well-defined fistula is not seen, however. There is no pelvic mass or pelvic fluid collection. The appendix is absent.  There is mild wall thickening in the cecum without surrounding mesenteric inflammation. There is no bowel obstruction. No free air or portal venous air.   There is no ascites, adenopathy, or abscess in the abdomen or pelvis. There is atherosclerotic change in the aorta. Aorta is tortuous but nonaneurysmal. There is marked disc space narrowing at L5-S1. There is degenerative change throughout the lumbar spine. There is no blastic or lytic bone lesion appreciable.  IMPRESSION: A small amount of air is noted in the urinary bladder the etiology for this air is uncertain. Question if patient has been instrumented recently. If the patient has not been instrumented recently, further assessment may be warranted. Note that there are sigmoid diverticula immediately adjacent to the posterior wall of the urinary bladder. A direct fistula is not seen. The possibility of inflammation from the sigmoid colon causing subtle fistula leading to air in the urinary bladder may need to be considered; this finding may warrant direct visualization of the urinary bladder to assess the posterior wall region to exclude the possibility of such a fistula. Note, however, that the posterior urinary bladder wall does not appear significantly thickened.  Pilar Plate diverticulitis is not seen on this study.  Question mild colitis in the cecum region.  The liver is enlarged. No focal liver lesions are identified. Gallbladder is absent. Mild biliary duct dilatation is probably secondary to post  cholecystectomy state.  No bowel obstruction. No abscess. Appendix absent. No hydronephrosis.  There are several renal cysts on the left. There is a small noncystic renal mass arising from lateral left kidney. This mass appears stable compared to prior study from September 2014. Given the appearance of this small mass, a followup CT or MR of the kidneys in approximately 6 months would be reasonable for further assessment.   Electronically Signed   By: Lowella Grip M.D.   On: 07/20/2013 18:44    EKG: PENDING.   Assessment/Plan Active Problems:   ANXIETY DEPRESSION   NEUROPATHY   HYPERTENSION, BENIGN    SJOGREN'S SYNDROME   URINARY INCONTINENCE   Hypokalemia   UTI (lower urinary tract infection)  1. UTI: Admit to inpatient, started on IV rocephin. Urine cultures ordered. Gentle hydration.   Hypokalemia:  Replete as needed.  Magnesium levels ordered.  Recheck in am.    Prolonged qt interval: Possibly from hypokalemia and hydroxychloroquine and many other medications that she is on.  I have stopped them , and can be restarted later on, when qt interval is better.  Replete k and check magnesium.  Watch her on tele for torsades.    Sjogrens syndrome:  plaqueinil is on hold for now.  Resume soon.    Accelerated hypertension: - resume home medications and prn hydralazine.   Chronic headaches:  - possibly from the elevated bp .  - better now.  - resume topamax.    DVT prophylaxis.       Code Status: presumed full code.  Family Communication: none at bedside Disposition Plan: admit to telemetry  Time spent: 75 minutes.  Hosie Poisson [facility] Pager [phone]  **Disclaimer: This note may have been dictated with voice recognition software. Similar sounding words can inadvertently be transcribed and this note may contain transcription errors which may not have been corrected upon publication of note.**

## 2013-07-20 NOTE — Progress Notes (Signed)
80 yo woman with epigastric pulsing pain x 4 days.  She also has some diarrhea.  No nausea, vomiting.  No fever.  No chest pain or shortness of breath.     Objective:  NAD, appears frail and needs assistance walking in the office.  Patient rambles on about a myriad of problems  Chest: clear Heart:  Reg, 1/6 systolic murmur Abdomen:  3 cm by palpation pulsatile abdominal mass with very faint bruit.  She is minimally tender with deep palpation there, but more tender in right lower quadrant.  She is unsure of her medicine use.  Thyroid biopsy BENIGN. FINDINGS CONSISTENT WITH NON-NEOPLASTIC GOITER. Mali RUND DO Pathologist, Electronic Signature (Case signed 06/19/2013)

## 2013-07-20 NOTE — Telephone Encounter (Signed)
Pt called worried because she woke up this morning at 630a covered in sweat and her BP was 209/76. Said she waited a few hours and then checked it again and it was 192/62. Advised to RTC. She will come in today to see Dr. Carlean Jews

## 2013-07-20 NOTE — ED Provider Notes (Signed)
CSN: [id number]     Arrival date & time 07/20/13  1507 History   First MD Initiated Contact with Patient 07/20/13 1533     Chief Complaint  Patient presents with  . Abdominal Pain  . Altered Mental Status    HPI The patient was sent to the emergency room after seeing her doctor at the urgent care. Dr. noted the patient appeared to be somewhat rambling in her speech although not confused. Patient had been complaining of abdominal pain and he palpated a pulsatile mass in her abdomen and fell she did come to the emergency room for further evaluation. The patient's doctor had also spoken to the patient's daughter and she was concerned about whether the patient was getting her medications properly at home.  Is difficult to get a clear history from the patient. The patient starts by telling me about several things that occurred 6 months ago. However her main focus seems to be that she's having difficulty with complaints of chronic headaches as well as lower abdominal pain and discomfort. She has had incidents of urinary incontinence. She has seen neurology for this problem. It has not gotten any better. Patient has also been having trouble with constipation. The pain is in her lower abdomen. It does not radiate. She has not had trouble with nausea or vomiting. She has not had a fever. Past Medical History  Diagnosis Date  . Sjogren's syndrome   . Dry eye syndrome   . Hypertension, benign   . Mitral valve prolapse   . GERD (gastroesophageal reflux disease)   . Diverticulosis of colon   . Irritable bowel syndrome   . Urinary incontinence   . Low back pain syndrome   . Fibromyalgia   . Memory loss   . Anxiety and depression   . History of adverse drug reaction   . Peripheral neuropathy     "both feet and legs"  . Shortness of breath 07/18/11    "alot lately"  . Anginal pain   . History of recurrent UTIs   . H/O hiatal hernia   . Anxiety   . Dementia   . Depression   . Abnormality of gait    . Thyroid nodule   . Headache(784.0) 09/05/2012  . Adenomatous polyp of colon 2002    69mm   Past Surgical History  Procedure Laterality Date  . Vesicovaginal fistula closure w/ tah    . Appendectomy    . Cholecystectomy  2000  . Mandible surgery    . Temporomandibular joint surgery  1986  . Cataract extraction, bilateral    . Abdominal hysterectomy  1967  . Dental surgery      multiple tooth extractions  . Esophagogastroduodenoscopy (egd) with esophageal dilation N/A 08/23/2012    Procedure: ESOPHAGOGASTRODUODENOSCOPY (EGD) WITH ESOPHAGEAL DILATION;  Surgeon: Milus Banister, MD;  Location: [facility];  Service: Endoscopy;  Laterality: N/A;  . Colonoscopy w/ biopsies      multiple  . Nasal septum surgery  1980  . Transthoracic echocardiogram  2001    mild LVH, normal LV  . Nm myocar perf wall motion  2003    persantine - normal static and dynamic study w/apical thinning and presvered LV function, no ischemia  . Cardiac catheterization  02/17/2003    normal L main, LAD free of disease, Cfx free of disease, RCA free of disease (Dr. Rockne Menghini)   Family History  Problem Relation Age of Onset  . Colon cancer Sister   . Heart  disease Father     heart attack  . Pneumonia Mother   . Heart attack Mother   . Hypertension Mother   . Throat cancer Brother   . Hypertension Maternal Grandmother    History  Substance Use Topics  . Smoking status: Former Research scientist (life sciences)  . Smokeless tobacco: Never Used  . Alcohol Use: No   OB History   Grav Para Term Preterm Abortions TAB SAB Ect Mult Living                 Review of Systems  Constitutional: Negative for fever.  Respiratory: Negative for cough.   Cardiovascular: Negative for chest pain.  Gastrointestinal: Positive for abdominal pain. Negative for abdominal distention.  Genitourinary: Positive for dysuria.  Musculoskeletal: Negative for back pain.  Skin: Negative for rash.  Neurological: Positive for headaches.  All other systems  reviewed and are negative.     Allergies  Codeine; Doxycycline; Klonopin; Meperidine hcl; Naproxen; Norflex; Oxycodone-acetaminophen; Penicillins; Phenothiazines; Propoxyphene hcl; Stelazine; Sulfamethoxazole-trimethoprim; Tolectin; Tramadol; Zoloft; and Lubiprostone  Home Medications   Prior to Admission medications   Medication Sig Start Date End Date Taking? Authorizing Provider  ALPRAZolam Duanne Moron) 0.5 MG tablet Take 1 tablet (0.5 mg total) by mouth 2 (two) times daily as needed for anxiety. 05/03/13   Robyn Haber, MD  aspirin EC 81 MG tablet Take 81 mg by mouth daily.    Historical Provider, MD  carboxymethylcellulose (REFRESH TEARS) 0.5 % SOLN 1 drop 2 (two) times daily.      Historical Provider, MD  diltiazem (CARDIZEM CD) 120 MG 24 hr capsule TAKE ONE CAPSULE BY MOUTH EVERY DAY 05/22/13   Noralee Space, MD  donepezil (ARICEPT) 5 MG tablet Take 1 tablet (5 mg total) by mouth at bedtime. 11/28/12   Gatha Mayer, MD  DULoxetine (CYMBALTA) 60 MG capsule Take 60 mg by mouth daily.    Historical Provider, MD  HYDROcodone-acetaminophen (NORCO/VICODIN) 5-325 MG per tablet Take 1 tablet by mouth 2 (two) times daily as needed. MAX 3 tablets PER DAY 05/26/13   Noralee Space, MD  Hydrocortisone [facility] RE) Place rectally as needed.    Historical Provider, MD  hydroxychloroquine (PLAQUENIL) 200 MG tablet Take 200 mg by mouth. As directed by Dr. Estanislado Pandy    Historical Provider, MD  lansoprazole (PREVACID) 30 MG capsule TAKE ONE CAPSULE BY MOUTH EVERY DAY 01/06/13   Noralee Space, MD  lisinopril (PRINIVIL,ZESTRIL) 10 MG tablet Take 1 tablet (10 mg total) by mouth daily. 06/30/13   Pixie Casino, MD  metoprolol (LOPRESSOR) 50 MG tablet Take 1 tablet (50 mg total) by mouth 2 (two) times daily. 06/30/13   Pixie Casino, MD  nystatin (MYCOSTATIN) 100000 UNIT/ML suspension Take 5 mLs (500,000 Units total) by mouth 4 (four) times daily. 07/18/13   Robyn Haber, MD  pilocarpine (SALAGEN) 5 MG  tablet Take 5 mg by mouth 2 (two) times daily. As directed by Dr. Estanislado Pandy     Historical Provider, MD  polyethylene glycol [facility] / Floria Raveling) packet Take 17 g by mouth daily.     Historical Provider, MD  sodium chloride (OCEAN) 0.65 % SOLN nasal spray Place 1 spray into the nose as needed for congestion. 08/23/12   Modena Jansky, MD  topiramate (TOPAMAX) 25 MG tablet Take 1 tablet (25 mg total) by mouth 2 (two) times daily. 07/17/13   Robyn Haber, MD   BP 173/77  Pulse 56  Temp(Src) 97.8 F (36.6 C) (Oral)  Resp 18  Ht 5\' 3"  (1.6 m)  Wt 105 lb (47.628 kg)  BMI 18.60 kg/m2  SpO2 99% Physical Exam  Nursing note and vitals reviewed. Constitutional: She is oriented to person, place, and time. No distress.  HENT:  Head: Normocephalic and atraumatic.  Right Ear: External ear normal.  Left Ear: External ear normal.  Mouth/Throat: No oropharyngeal exudate.  Eyes: Conjunctivae and EOM are normal. Right eye exhibits no discharge. Left eye exhibits no discharge. No scleral icterus.  Neck: Neck supple. No tracheal deviation present.  Cardiovascular: Normal rate, regular rhythm and intact distal pulses.   Pulmonary/Chest: Effort normal and breath sounds normal. No stridor. No respiratory distress. She has no wheezes. She has no rales.  Abdominal: Soft. Bowel sounds are normal. She exhibits no distension. There is no tenderness. There is no rebound and no guarding.  No enlarged pulsatile mass appreciated, patient's abdominal aorta is palpable on exam but does not appear enlarged  Musculoskeletal: She exhibits no edema and no tenderness.  Neurological: She is alert and oriented to person, place, and time. She has normal strength. No cranial nerve deficit (no facial droop, extraocular movements intact, no slurred speech) or sensory deficit. She exhibits normal muscle tone. She displays no seizure activity. Coordination normal.  Skin: Skin is warm and dry. No rash noted. She is not diaphoretic.   Psychiatric: She has a normal mood and affect.    ED Course  Procedures (including critical care time) Labs Review Labs Reviewed  COMPREHENSIVE METABOLIC PANEL - Abnormal; Notable for the following:    Potassium 2.9 (*)    AST 46 (*)    ALT 57 (*)    GFR calc non Af Amer 82 (*)    All other components within normal limits  URINALYSIS, ROUTINE W REFLEX MICROSCOPIC - Abnormal; Notable for the following:    Specific Gravity, Urine 1.003 (*)    Leukocytes, UA SMALL (*)    All other components within normal limits  URINE CULTURE  CBC WITH DIFFERENTIAL  LIPASE, BLOOD  URINE MICROSCOPIC-ADD ON  OCCULT BLOOD X 1 CARD TO LAB, STOOL  MAGNESIUM  POC OCCULT BLOOD, ED  TYPE AND SCREEN  ABO/RH    Imaging Review Ct Head Wo Contrast  07/20/2013   CLINICAL DATA:  Altered mental status.  EXAM: CT HEAD WITHOUT CONTRAST  TECHNIQUE: Contiguous axial images were obtained from the base of the skull through the vertex without intravenous contrast.  COMPARISON:  Head CT scan 04/20/2013.  FINDINGS: There is some cortical atrophy and chronic microvascular ischemic change. No evidence of acute intracranial abnormality including infarct, hemorrhage, mass lesion, mass effect, midline shift or abnormal extra-axial fluid collection is identified. 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 The calvarium is intact.  IMPRESSION: No acute finding.  Stable compared to prior exam.   Electronically Signed   By: Inge Rise M.D.   On: 07/20/2013 18:33   Ct Abdomen Pelvis W Contrast  07/20/2013   CLINICAL DATA:  Epigastric region pain and altered mental status; pulsatile abdominal mass on physical examination  EXAM: CT ABDOMEN AND PELVIS WITH CONTRAST  TECHNIQUE: Multidetector CT imaging of the abdomen and pelvis was performed using the standard protocol following bolus administration of intravenous contrast. Oral contrast was also administered.  CONTRAST:  181mL OMNIPAQUE IOHEXOL 300 MG/ML  SOLN  COMPARISON:  CT  abdomen and pelvis October 18, 2012  FINDINGS: There is mild bibasilar lung scarring. There is no edema or consolidation in the lung bases.  Liver is enlarged,  measuring 18.4 cm in length. No focal liver lesions are identified. There is mild intrahepatic and extrahepatic biliary duct dilatation without mass or calculus seen. Gallbladder is absent, and these findings are probably due to post cholecystectomy state. They appear stable compared to the prior study.  Normal bilaterally.  There is a simple cyst in the anterior upper to mid pole left kidney measuring 1.1 by 1.0 cm. There is a stable mass in the left kidney laterally measuring 1.1 by 0.6 cm. This mass has attenuation values higher than is expected with a simple cyst. It is best seen on axial slice 14 series 3. There is a simple cyst in the posterior upper to midpole left kidney measuring 1.0 x 1.0 cm. There is a simple cyst in the lateral mid left kidney measuring 1.2 x 1.0 cm. There are no appreciable renal masses on the right. There is no hydronephrosis on either side. There is no renal or ureteral calculus on either side.  In the pelvis, the urinary bladder is midline and with normal wall thickness. A small amount of air in the urinary bladder is noted. The etiology for this air is uncertain. There is extensive diverticulosis in the sigmoid colon, and there is diffuse muscular hypertrophy in this area. There is no surrounding mesenteric inflammation. Note that several diverticula do abut the urinary bladder. A well-defined fistula is not seen, however. There is no pelvic mass or pelvic fluid collection. The appendix is absent.  There is mild wall thickening in the cecum without surrounding mesenteric inflammation. There is no bowel obstruction. No free air or portal venous air.  There is no ascites, adenopathy, or abscess in the abdomen or pelvis. There is atherosclerotic change in the aorta. Aorta is tortuous but nonaneurysmal. There is marked disc  space narrowing at L5-S1. There is degenerative change throughout the lumbar spine. There is no blastic or lytic bone lesion appreciable.  IMPRESSION: A small amount of air is noted in the urinary bladder the etiology for this air is uncertain. Question if patient has been instrumented recently. If the patient has not been instrumented recently, further assessment may be warranted. Note that there are sigmoid diverticula immediately adjacent to the posterior wall of the urinary bladder. A direct fistula is not seen. The possibility of inflammation from the sigmoid colon causing subtle fistula leading to air in the urinary bladder may need to be considered; this finding may warrant direct visualization of the urinary bladder to assess the posterior wall region to exclude the possibility of such a fistula. Note, however, that the posterior urinary bladder wall does not appear significantly thickened.  Pilar Plate diverticulitis is not seen on this study.  Question mild colitis in the cecum region.  The liver is enlarged. No focal liver lesions are identified. Gallbladder is absent. Mild biliary duct dilatation is probably secondary to post cholecystectomy state.  No bowel obstruction. No abscess. Appendix absent. No hydronephrosis.  There are several renal cysts on the left. There is a small noncystic renal mass arising from lateral left kidney. This mass appears stable compared to prior study from September 2014. Given the appearance of this small mass, a followup CT or MR of the kidneys in approximately 6 months would be reasonable for further assessment.   Electronically Signed   By: Lowella Grip M.D.   On: 07/20/2013 18:44     MDM   Final diagnoses:  UTI (lower urinary tract infection)  Hypokalemia   Patient's urinalysis is consistent with urinary tract  infection. Patient also has significant hypokalemia with potassium of 2.9. CT finding showing a small amount of air noted in the urinary bladder. The  question of possible entero vesicular fistula is raised.  The patient did not have a catheterization procedure in the emergency department.  I reviewed the primary care doctor's notes. He is concerned about her home situation. Patient may benefit from a home health assessment.  I will consult with the medical doctor regarding admission for treatment for urinary tract infection and hypokalemia.    Dorie Rank, MD 07/20/13 2019

## 2013-07-21 DIAGNOSIS — R9431 Abnormal electrocardiogram [ECG] [EKG]: Secondary | ICD-10-CM

## 2013-07-21 DIAGNOSIS — K219 Gastro-esophageal reflux disease without esophagitis: Secondary | ICD-10-CM

## 2013-07-21 DIAGNOSIS — R413 Other amnesia: Secondary | ICD-10-CM

## 2013-07-21 DIAGNOSIS — K589 Irritable bowel syndrome without diarrhea: Secondary | ICD-10-CM

## 2013-07-21 DIAGNOSIS — R32 Unspecified urinary incontinence: Secondary | ICD-10-CM

## 2013-07-21 DIAGNOSIS — R51 Headache: Secondary | ICD-10-CM

## 2013-07-21 LAB — BASIC METABOLIC PANEL
BUN: 8 mg/dL (ref 6–23)
CHLORIDE: 105 meq/L (ref 96–112)
CO2: 28 meq/L (ref 19–32)
Calcium: 9.3 mg/dL (ref 8.4–10.5)
Creatinine, Ser: 0.61 mg/dL (ref 0.50–1.10)
GFR calc Af Amer: >90 mL/min (ref >90)
GFR calc non Af Amer: 82 mL/min — ABNORMAL LOW (ref >90)
Glucose, Bld: 94 mg/dL (ref 70–99)
POTASSIUM: 2.8 meq/L — AB (ref 3.7–5.3)
Sodium: 144 mEq/L (ref 137–147)

## 2013-07-21 LAB — URINE CULTURE

## 2013-07-21 LAB — POTASSIUM: POTASSIUM: 3.3 meq/L — AB (ref 3.7–5.3)

## 2013-07-21 MED ORDER — POTASSIUM CHLORIDE 20 MEQ/15ML (10%) PO LIQD
40.0000 meq | Freq: Every day | ORAL | Status: DC
  Administered 2013-07-21 – 2013-07-22 (×2): 40 meq via ORAL
  Filled 2013-07-21 (×2): qty 30

## 2013-07-21 MED ORDER — POTASSIUM CHLORIDE 2 MEQ/ML IV SOLN
INTRAVENOUS | Status: DC
  Administered 2013-07-21: 19:00:00 via INTRAVENOUS
  Filled 2013-07-21 (×3): qty 1000

## 2013-07-21 MED ORDER — POTASSIUM CHLORIDE CRYS ER 20 MEQ PO TBCR
40.0000 meq | EXTENDED_RELEASE_TABLET | ORAL | Status: AC
  Administered 2013-07-21: 40 meq via ORAL
  Filled 2013-07-21: qty 2

## 2013-07-21 MED ORDER — POLYVINYL ALCOHOL 1.4 % OP SOLN
1.0000 [drp] | Freq: Two times a day (BID) | OPHTHALMIC | Status: DC | PRN
  Administered 2013-07-21: 1 [drp] via OPHTHALMIC
  Filled 2013-07-21: qty 15

## 2013-07-21 MED ORDER — DULOXETINE HCL 30 MG PO CPEP
30.0000 mg | ORAL_CAPSULE | ORAL | Status: AC
  Administered 2013-07-21: 30 mg via ORAL
  Filled 2013-07-21: qty 1

## 2013-07-21 MED ORDER — DILTIAZEM HCL ER BEADS 120 MG PO CP24: 120.0000 mg | ORAL_CAPSULE | Freq: Every day | ORAL | Status: DC

## 2013-07-21 MED ORDER — BOOST PLUS PO LIQD
237.0000 mL | Freq: Two times a day (BID) | ORAL | Status: DC
  Administered 2013-07-21 – 2013-07-22 (×3): 237 mL via ORAL
  Filled 2013-07-21 (×3): qty 237

## 2013-07-21 MED ORDER — MAGNESIUM SULFATE 50 % IJ SOLN
3.0000 g | Freq: Once | INTRAVENOUS | Status: AC
  Administered 2013-07-21: 3 g via INTRAVENOUS
  Filled 2013-07-21: qty 6

## 2013-07-21 MED ORDER — DULOXETINE HCL 30 MG PO CPEP
30.0000 mg | ORAL_CAPSULE | Freq: Every day | ORAL | Status: DC
  Administered 2013-07-22: 30 mg via ORAL
  Filled 2013-07-21: qty 1

## 2013-07-21 MED ORDER — DULOXETINE HCL 60 MG PO CPEP
60.0000 mg | ORAL_CAPSULE | Freq: Every day | ORAL | Status: DC
  Filled 2013-07-21: qty 1

## 2013-07-21 MED ORDER — DONEPEZIL HCL 10 MG PO TABS
10.0000 mg | ORAL_TABLET | Freq: Every day | ORAL | Status: DC
  Filled 2013-07-21: qty 1

## 2013-07-21 MED ORDER — METOPROLOL TARTRATE 50 MG PO TABS
50.0000 mg | ORAL_TABLET | Freq: Two times a day (BID) | ORAL | Status: DC
  Administered 2013-07-21 – 2013-07-22 (×3): 50 mg via ORAL
  Filled 2013-07-21 (×4): qty 1

## 2013-07-21 NOTE — Progress Notes (Signed)
PT Cancellation Note  Patient Details Name: NELA BASCOM MRN: [id number] DOB: 1929/07/27   Cancelled Treatment:    Reason Eval/Treat Not Completed: Medical issues which prohibited therapy--RN reports pt had abnormal rthythm while mobilizing OOB. Requested PT check back another time to attempt PT eval.  Will check back as schedule permits. Thanks.    Weston Anna, MPT Pager: [phone]

## 2013-07-21 NOTE — Care Management Note (Unsigned)
    Page 1 of 2   07/22/2013     11:20:58 AM CARE MANAGEMENT NOTE 07/22/2013  Patient:  Ariana [name]   Account Number:  [id number]  Date Initiated:  07/21/2013  Documentation initiated by:  ,  Subjective/Objective Assessment:   80 year old female admitted with UTI and hypokalemia.     Action/Plan:   PT consult ordered and have recommended HHPT.   Anticipated DC Date:  07/22/2013   Anticipated DC Plan:  HOME W HOME HEALTH SERVICES      DC Planning Services  CM consult      Choice offered to / List presented to:  [name]-1 Patient        HH arranged  HH-2 PT  HH-4 NURSE'S AIDE      HH agency  [facility]   Status of service:  In process, will continue to follow Medicare Important Message given?   (If response is "NO", the following Medicare IM given date fields will be blank) Date Medicare IM given:   Date Additional Medicare IM given:    Discharge Disposition:  HOME W HOME HEALTH SERVICES  Per UR Regulation:  Reviewed for med. necessity/level of care/duration of stay  If discussed at Long Length of Stay Meetings, dates discussed:    Comments:  07/22/13   RN BSN [phone] Pt agrees to having HHPT at home. She wants to use the agency that Dr Lauenstein recently set her up with but she does not remember the name and neither does her daughter. I called Dr Lauenstein's office and was told the agency was [facility]. Referral has been made and the requested paperwork faxed over. I also called pt's daughter and informed her of the name of the agency.  07/21/13   RN BSN [phone] I met with pt and her daughter Ariana [name] at the bedside to discuss d/[name] planning. Pt stated she lives at home alone but has caregivers 9a-5p. The caregivers assist her with all she needs including ambulation. I mentioned that PT has recommended HHPT. Pt and daughter do not feel this is needed since she already has the caregivers. Daughter stated she can increase the # of  hours the caregivers are there for a few days once she leaves the hospital.    

## 2013-07-21 NOTE — Progress Notes (Signed)
OT Cancellation Note  Patient Details Name: LAURINDA CARRENO MRN: [id number] DOB: 1929/06/02   Cancelled Treatment:    Reason Eval/Treat Not Completed: Medical issues which prohibited therapy Will check back later or next day  Betsy Pries 07/21/2013, 12:16 PM

## 2013-07-21 NOTE — Consult Note (Signed)
CARDIOLOGY CONSULT NOTE   Patient ID: Ariana [name] MRN: [id number] DOB/AGE: [birth year] 80 y.o.  Admit Date: 07/20/2013  Primary Physician: Robyn Haber, MD  Clinical Summary [name] is a 80 y.o.female. Currently she is hospitalized for several different medical problems. He was noted on her EKG on admission that her QT interval was prolonged. Cardiology was consulted for further input. The first EKG was of poor quality. She had electrolyte abnormalities. Her potassium today was still low at 2.8. Followup EKG today shows that her QT interval is not significantly prolonged.   Allergies  Allergen Reactions  . Codeine Nausea Only    unless given with Phenergan  . Doxycycline     Unknown  . Klonopin [Clonazepam]     Causes hallucination   . Meperidine Hcl Nausea Only    unless given with Phenergan  . Naproxen   . Norflex [Orphenadrine Citrate] Nausea Only    Unless given with Phenergan  . Oxycodone-Acetaminophen Nausea Only    unless given with phenergan  . Penicillins     Unknown  . Phenothiazines     Unknown  . Propoxyphene Hcl Nausea Only    unless given with phenergan  . Stelazine     Unknown  . Sulfamethoxazole-Trimethoprim     Unknown  . Tolectin [Tolmetin Sodium]     Unknown  . Tramadol     Unknown  . Zoloft [Sertraline Hcl]     Caused pt to sleep a lot  . Lubiprostone Rash    Medications Scheduled Medications: . aspirin EC  81 mg Oral Daily  . diltiazem  120 mg Oral Daily  . DULoxetine  60 mg Oral Daily  . enoxaparin (LOVENOX) injection  40 mg Subcutaneous QHS  . lisinopril  10 mg Oral Daily  . metoprolol  50 mg Oral BID  . pantoprazole  40 mg Oral Daily  . pilocarpine  5 mg Oral BID  . potassium chloride  40 mEq Oral Daily  . sodium chloride  3 mL Intravenous Q12H  . topiramate  25 mg Oral BID     Infusions: . sodium chloride 1,000 mL (07/20/13 1636)  . sodium chloride       PRN Medications:  ALPRAZolam, hydrALAZINE,  HYDROcodone-acetaminophen, hydrocortisone, polyvinyl alcohol   Past Medical History  Diagnosis Date  . Sjogren's syndrome   . Dry eye syndrome   . Hypertension, benign   . Mitral valve prolapse   . GERD (gastroesophageal reflux disease)   . Diverticulosis of colon   . Irritable bowel syndrome   . Urinary incontinence   . Low back pain syndrome   . Fibromyalgia   . Memory loss   . Anxiety and depression   . History of adverse drug reaction   . Peripheral neuropathy     "both feet and legs"  . Shortness of breath 07/18/11    "alot lately"  . Anginal pain   . History of recurrent UTIs   . H/O hiatal hernia   . Anxiety   . Dementia   . Depression   . Abnormality of gait   . Thyroid nodule   . Headache(784.0) 09/05/2012  . Adenomatous polyp of colon 2002    60mm    Past Surgical History  Procedure Laterality Date  . Vesicovaginal fistula closure w/ tah    . Appendectomy    . Cholecystectomy  2000  . Mandible surgery    . Temporomandibular joint surgery  1986  . Cataract extraction, bilateral    .  Abdominal hysterectomy  1967  . Dental surgery      multiple tooth extractions  . Esophagogastroduodenoscopy (egd) with esophageal dilation N/A 08/23/2012    Procedure: ESOPHAGOGASTRODUODENOSCOPY (EGD) WITH ESOPHAGEAL DILATION;  Surgeon: Milus Banister, MD;  Location: [facility];  Service: Endoscopy;  Laterality: N/A;  . Colonoscopy w/ biopsies      multiple  . Nasal septum surgery  1980  . Transthoracic echocardiogram  2001    mild LVH, normal LV  . Nm myocar perf wall motion  2003    persantine - normal static and dynamic study w/apical thinning and presvered LV function, no ischemia  . Cardiac catheterization  02/17/2003    normal L main, LAD free of disease, Cfx free of disease, RCA free of disease (Dr. Rockne Menghini)    Family History  Problem Relation Age of Onset  . Colon cancer Sister   . Heart disease Father     heart attack  . Pneumonia Mother   . Heart attack  Mother   . Hypertension Mother   . Throat cancer Brother   . Hypertension Maternal Grandmother     Social History [name] reports that she has quit smoking. She has never used smokeless tobacco. [name] reports that she does not drink alcohol.  Review of Systems  At this point she has no significant complaints. I'm not sure how reliable her review of systems is. She denies fever, chills, headache, sweats, rash, change in vision, change in hearing, chest pain, cough, nausea vomiting, urinary symptoms. All other systems are reviewed and are negative.  Physical Examination Blood pressure 146/71, pulse 61, temperature 97.9 F (36.6 C), temperature source Oral, resp. rate 16, height 5\' 3"  (1.6 m), weight 102 lb 11.2 oz (46.584 kg), SpO2 99.00%. No intake or output data in the 24 hours ending 07/21/13 1316  Patient is stable lying in bed. Head is atraumatic. Lungs reveal a few scattered rhonchi. Cardiac exam reveals S1 and S2. There is no jugulovenous distention. There is no respiratory distress. The abdomen is soft. There is no peripheral edema.  Prior Cardiac Testing/Procedures  Lab Results  Basic Metabolic Panel:  Recent Labs Lab 07/20/13 1630 07/20/13 2017 07/21/13 0519  NA 142  --  144  K 2.9*  --  2.8*  CL 101  --  105  CO2 29  --  28  GLUCOSE 91  --  94  BUN 12  --  8  CREATININE 0.62  --  0.61  CALCIUM 10.1  --  9.3  MG  --  1.9  --     Liver Function Tests:  Recent Labs Lab 07/20/13 1630  AST 46*  ALT 57*  ALKPHOS 96  BILITOT 0.7  PROT 7.2  ALBUMIN 4.1    CBC:  Recent Labs Lab 07/20/13 1300 07/20/13 1630  WBC 5.4 6.5  NEUTROABS  --  3.7  HGB 12.5 12.9  HCT 39.6 37.7  MCV 94.9 90.8  PLT  --  192    Cardiac Enzymes: No results found for this basename: CKTOTAL, CKMB, CKMBINDEX, TROPONINI,  in the last 168 hours  BNP: No components found with this basename: POCBNP,    Radiology: Ct Head Wo Contrast  07/20/2013   CLINICAL DATA:  Altered  mental status.  EXAM: CT HEAD WITHOUT CONTRAST  TECHNIQUE: Contiguous axial images were obtained from the base of the skull through the vertex without intravenous contrast.  COMPARISON:  Head CT scan 04/20/2013.  FINDINGS: There is some cortical  atrophy and chronic microvascular ischemic change. No evidence of acute intracranial abnormality including infarct, hemorrhage, mass lesion, mass effect, midline shift or abnormal extra-axial fluid collection is identified. 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 The calvarium is intact.  IMPRESSION: No acute finding.  Stable compared to prior exam.   Electronically Signed   By: Inge Rise M.D.   On: 07/20/2013 18:33   Ct Abdomen Pelvis W Contrast  07/20/2013   CLINICAL DATA:  Epigastric region pain and altered mental status; pulsatile abdominal mass on physical examination  EXAM: CT ABDOMEN AND PELVIS WITH CONTRAST  TECHNIQUE: Multidetector CT imaging of the abdomen and pelvis was performed using the standard protocol following bolus administration of intravenous contrast. Oral contrast was also administered.  CONTRAST:  124mL OMNIPAQUE IOHEXOL 300 MG/ML  SOLN  COMPARISON:  CT abdomen and pelvis October 18, 2012  FINDINGS: There is mild bibasilar lung scarring. There is no edema or consolidation in the lung bases.  Liver is enlarged, measuring 18.4 cm in length. No focal liver lesions are identified. There is mild intrahepatic and extrahepatic biliary duct dilatation without mass or calculus seen. Gallbladder is absent, and these findings are probably due to post cholecystectomy state. They appear stable compared to the prior study.  Normal bilaterally.  There is a simple cyst in the anterior upper to mid pole left kidney measuring 1.1 by 1.0 cm. There is a stable mass in the left kidney laterally measuring 1.1 by 0.6 cm. This mass has attenuation values higher than is expected with a simple cyst. It is best seen on axial slice 14 series 3. There is a  simple cyst in the posterior upper to midpole left kidney measuring 1.0 x 1.0 cm. There is a simple cyst in the lateral mid left kidney measuring 1.2 x 1.0 cm. There are no appreciable renal masses on the right. There is no hydronephrosis on either side. There is no renal or ureteral calculus on either side.  In the pelvis, the urinary bladder is midline and with normal wall thickness. A small amount of air in the urinary bladder is noted. The etiology for this air is uncertain. There is extensive diverticulosis in the sigmoid colon, and there is diffuse muscular hypertrophy in this area. There is no surrounding mesenteric inflammation. Note that several diverticula do abut the urinary bladder. A well-defined fistula is not seen, however. There is no pelvic mass or pelvic fluid collection. The appendix is absent.  There is mild wall thickening in the cecum without surrounding mesenteric inflammation. There is no bowel obstruction. No free air or portal venous air.  There is no ascites, adenopathy, or abscess in the abdomen or pelvis. There is atherosclerotic change in the aorta. Aorta is tortuous but nonaneurysmal. There is marked disc space narrowing at L5-S1. There is degenerative change throughout the lumbar spine. There is no blastic or lytic bone lesion appreciable.  IMPRESSION: A small amount of air is noted in the urinary bladder the etiology for this air is uncertain. Question if patient has been instrumented recently. If the patient has not been instrumented recently, further assessment may be warranted. Note that there are sigmoid diverticula immediately adjacent to the posterior wall of the urinary bladder. A direct fistula is not seen. The possibility of inflammation from the sigmoid colon causing subtle fistula leading to air in the urinary bladder may need to be considered; this finding may warrant direct visualization of the urinary bladder to assess the posterior wall region to exclude  the  possibility of such a fistula. Note, however, that the posterior urinary bladder wall does not appear significantly thickened.  Pilar Plate diverticulitis is not seen on this study.  Question mild colitis in the cecum region.  The liver is enlarged. No focal liver lesions are identified. Gallbladder is absent. Mild biliary duct dilatation is probably secondary to post cholecystectomy state.  No bowel obstruction. No abscess. Appendix absent. No hydronephrosis.  There are several renal cysts on the left. There is a small noncystic renal mass arising from lateral left kidney. This mass appears stable compared to prior study from September 2014. Given the appearance of this small mass, a followup CT or MR of the kidneys in approximately 6 months would be reasonable for further assessment.   Electronically Signed   By: Lowella Grip M.D.   On: 07/20/2013 18:44     ECG:   Her first EKG is of poor quality. Her second EKG reveals that her corrected QT interval is high normal.   Impression and Recommendations    ANXIETY DEPRESSION   NEUROPATHY   HYPERTENSION, BENIGN   SJOGREN'S SYNDROME   URINARY INCONTINENCE   Hypokalemia   UTI (lower urinary tract infection)     There are several medical issues. These are being treated. I would suggest continued aggressive treatment of her potassium.    Prolonged QT interval    Today's EKG shows no significant prolongation of her QT interval. No further cardiac workup is recommended. I would suggest treating her potassium aggressively. Also, consider limiting nonessential medications that might affect her QT interval. There is been no history of syncope. There've been no tachyarrhythmias documented.  Daryel November, MD 07/21/2013, 1:16 PM

## 2013-07-21 NOTE — Progress Notes (Signed)
This shift , scheduled medications not given, still awaiting verification of medications by daughter, per pharmacy.

## 2013-07-21 NOTE — Progress Notes (Signed)
INITIAL NUTRITION ASSESSMENT  Pt meets criteria for severe MALNUTRITION in the context of chronic illness as evidenced by severe muscle wasting in upper arms with 17.7% weight loss in the past 6 months.  DOCUMENTATION CODES Per approved criteria  -Severe malnutrition in the context of chronic illness -Underweight   INTERVENTION: - Boost Plus BID - Encouraged excellent meal intake - RD to continue to monitor   NUTRITION DIAGNOSIS: Increased nutrient needs related to unintended weight loss, underweight as evidenced by pt report, BMI of 18.2 kg/(m^2).   Goal: Pt to consume >90% of meals/supplements  Monitor:  Weights, labs, intake  Reason for Assessment: Consult for assessment   80 y.o. female  Admitting Dx: Abdominal pain and altered mental status   ASSESSMENT: Pt with h/o sjogrens syndrome, hypertension, IBS, urinary incontinence, fibromyalgia, dementia, chronic headaches, comes in after seeing her PCP for elevated blood pressures. Found to have urinary tract infection.   - Pt alone in room, kept rambling about different names of doctors and past visits over the years, hard to focus on questions that were asked  - States she has 1-2 caregivers that cook good meals for her which she eats at least breakfast and dinner - States she used to have a caregiver that did a lot of tv dinners and frozen meals but her new caregivers cook wholesome meals - Drinks 2 Boost/day  - States her dentures don't fit right and has some problems swallowing  - Reports her usual body weight is 127 pounds but she started losing weight when her son died in June 13, 2010. Now weighs 102 pounds.  - States she ate 75% of breakfast this morning  Potassium low, getting oral replacement  AST/ALT elevated   Nutrition Focused Physical Exam:  Subcutaneous Fat:  Orbital Region: wnl Upper Arm Region: severe muscle wasting, mild fat loss  Muscle:  Temple Region: wnl Clavicle Bone Region: mild fat loss Clavicle and  Acromion Bone Region: wnl Dorsal Hand: severe fat loss Patellar Region: wnl Anterior Thigh Region: wnl Posterior Calf Region: wnl  Edema: None noted     Height: Ht Readings from Last 1 Encounters:  07/20/13 5\' 3"  (1.6 m)    Weight: Wt Readings from Last 1 Encounters:  07/20/13 102 lb 11.2 oz (46.584 kg)    Ideal Body Weight: 115 lbs   % Ideal Body Weight: 89%  Wt Readings from Last 10 Encounters:  07/20/13 102 lb 11.2 oz (46.584 kg)  07/20/13 105 lb 9.6 oz (47.9 kg)  07/17/13 108 lb (48.988 kg)  06/30/13 109 lb (49.442 kg)  05/22/13 109 lb (49.442 kg)  05/06/13 105 lb (47.628 kg)  05/06/13 112 lb 9.6 oz (51.075 kg)  04/26/13 112 lb (50.803 kg)  04/24/13 112 lb (50.803 kg)  02/04/13 124 lb 3.2 oz (56.337 kg)    Usual Body Weight: 127 lbs per pt  % Usual Body Weight: 80%  BMI:  Body mass index is 18.2 kg/(m^2). Underweight  Estimated Nutritional Needs: Kcal: 1400-1600 Protein: 55-70g Fluid: 1.4-1.6L/day   Skin: Intact   Diet Order: General  EDUCATION NEEDS: -No education needs identified at this time  No intake or output data in the 24 hours ending 07/21/13 1313  Last BM: 6/7  Labs:   Recent Labs Lab 07/20/13 1630 07/20/13 2015/06/13 07/21/13 0519  NA 142  --  144  K 2.9*  --  2.8*  CL 101  --  105  CO2 29  --  28  BUN 12  --  8  CREATININE 0.62  --  0.61  CALCIUM 10.1  --  9.3  MG  --  1.9  --   GLUCOSE 91  --  94    CBG (last 3)  No results found for this basename: GLUCAP,  in the last 72 hours  Scheduled Meds: . aspirin EC  81 mg Oral Daily  . diltiazem  120 mg Oral Daily  . DULoxetine  60 mg Oral Daily  . enoxaparin (LOVENOX) injection  40 mg Subcutaneous QHS  . lisinopril  10 mg Oral Daily  . metoprolol  50 mg Oral BID  . pantoprazole  40 mg Oral Daily  . pilocarpine  5 mg Oral BID  . potassium chloride  40 mEq Oral Daily  . sodium chloride  3 mL Intravenous Q12H  . topiramate  25 mg Oral BID    Continuous Infusions: .  sodium chloride 1,000 mL (07/20/13 1636)  . sodium chloride      Past Medical History  Diagnosis Date  . Sjogren's syndrome   . Dry eye syndrome   . Hypertension, benign   . Mitral valve prolapse   . GERD (gastroesophageal reflux disease)   . Diverticulosis of colon   . Irritable bowel syndrome   . Urinary incontinence   . Low back pain syndrome   . Fibromyalgia   . Memory loss   . Anxiety and depression   . History of adverse drug reaction   . Peripheral neuropathy     "both feet and legs"  . Shortness of breath 07/18/11    "alot lately"  . Anginal pain   . History of recurrent UTIs   . H/O hiatal hernia   . Anxiety   . Dementia   . Depression   . Abnormality of gait   . Thyroid nodule   . Headache(784.0) 09/05/2012  . Adenomatous polyp of colon 2002    48mm    Past Surgical History  Procedure Laterality Date  . Vesicovaginal fistula closure w/ tah    . Appendectomy    . Cholecystectomy  2000  . Mandible surgery    . Temporomandibular joint surgery  1986  . Cataract extraction, bilateral    . Abdominal hysterectomy  1967  . Dental surgery      multiple tooth extractions  . Esophagogastroduodenoscopy (egd) with esophageal dilation N/A 08/23/2012    Procedure: ESOPHAGOGASTRODUODENOSCOPY (EGD) WITH ESOPHAGEAL DILATION;  Surgeon: Milus Banister, MD;  Location: [facility];  Service: Endoscopy;  Laterality: N/A;  . Colonoscopy w/ biopsies      multiple  . Nasal septum surgery  1980  . Transthoracic echocardiogram  2001    mild LVH, normal LV  . Nm myocar perf wall motion  2003    persantine - normal static and dynamic study w/apical thinning and presvered LV function, no ischemia  . Cardiac catheterization  02/17/2003    normal L main, LAD free of disease, Cfx free of disease, RCA free of disease (Dr. Rockne Menghini)    Carlis Stable MS, [place], [place] Pager [phone] Weekend/After Hours Pager

## 2013-07-21 NOTE — Progress Notes (Addendum)
[facility]                                                                              Patient Demographics  Ariana [name], is a 80 y.o. female, DOB - 07/02/1929, IPJ:[id number]  Admit date - 07/20/2013   Admitting Physician Hosie Poisson, MD  Outpatient Primary MD for the patient is Robyn Haber, MD  LOS - 1   Chief Complaint  Patient presents with  . Abdominal Pain  . Altered Mental Status      HPI: Ariana [name] is a 80 y.o. female with prior history og  sjogrens syndrome, hypertension, IBS, urinary incontinence, fibromyalgia, dementia, chronic headaches, comes in after seeing her PCP for elevated blood pressures.  She reports she woke up all sweaty this morning and her BP was found to be in 200/90's, with headaches. No nausea or vomiting, no chest pain, sob or palpitations. She reported intermittent fecal incontinence and constipaton with her history of IBS. She denied any abdominal pain at this time. At time of admission, her headache had improved. She was seen by her PCP and sent to ED, for evaluation of abdominal pulsatile mass. She underwent CT abdomen and pelvis which did not reveal AAA,. There was some air found in the bladder with unknown etiology, diverticulosis in the sigmoid colon. There was some concern about a fistula from the posterior urinary bladder to the sigmoid colon, but its not clear. She dis not appear to have any complaints with abdominal pain or dysuria. She also underwent CT head without contrast for evaluation of headaches, which was essentially stable when compared to previous exam. Patient does ramble on, very tangential, had to stop her in the middle of the conversation to focus on the questions. She was very concerned about the medications that she was taking and she does not feel comfortable taking them. She denied recent falls. No family at bedside. On her routine lab work she was found to be hypokalemic and ua revealed a mild UTI. EKG showed prolonged  qt interval. She was referred to medical service for admission for further management.    Assessment & Plan   Accelerated hypertension -Resolving -Blood pressure currently in the 734L systolically -Continue lisinopril, metoprolol, Cardizem, and hydralazine as needed  Prolonged QT interval/Asymptomatic SVT -Likely secondary to hypokalemia vs hydroxychloroquine, pilocarpine, aricept, topamax -Will discontinue ceftriaxone and aricept  -Will replete potassium and continue telemetry monitoring -Will continue topamax as there can be withdrawal seizures when suddenly discontinued, and will continue pilocarpine for her Sjogren's -Will also consult Cardiology  Urinary tract infection -UA: WBC 7-10, small leukocytes -Patient follows up with urology, Lillette Boxer. Dahlstedt -Contacted Dr. Dahlstedt's office and patient has a long history of recurrent UTI's and is only treated if culture positive.  Several treatments for overactive bladder have been tried but were not continued. Patient follows up with urology as needed.  Her last culture in March 2015 was negative. -Was given ceftriaxone, however, will discontinue due to prolonged QT -Pending culture  Hypokalemia -Magnesium 1.9 -Will replete and continue to monitor BMP  Sjogren's  -Plaquenil currently held  Chronic headaches -Continue topamax  -Improved, likely secondary to hypertension  IBS -Patient had some  fecal incontinence before being admitted. -Resolved  Depression/Anxiety -Will continue cymbalta (although this can increase QT) as there is risk of seizures if discontinued abruptly.  -Will hold xanax  Code Status: Full  Family Communication: None at bedside  Disposition Plan: Admitted  Time Spent in minutes   30 minutes  Procedures  None  Consults   Cardiology PCP, Dr. Joseph Art, via phone  DVT Prophylaxis  Lovenox  Lab Results  Component Value Date   PLT 192 07/20/2013    Medications  Scheduled Meds: .  aspirin EC  81 mg Oral Daily  . carboxymethylcellulose  1 drop Both Eyes BID  . cefTRIAXone (ROCEPHIN)  IV  1 g Intravenous Q24H  . diltiazem  120 mg Oral Daily  . donepezil  5 mg Oral QHS  . enoxaparin (LOVENOX) injection  40 mg Subcutaneous QHS  . lisinopril  10 mg Oral Daily  . pantoprazole  40 mg Oral Daily  . pilocarpine  5 mg Oral BID  . potassium chloride  40 mEq Oral Daily  . potassium chloride  40 mEq Oral Once  . sodium chloride  3 mL Intravenous Q12H  . topiramate  25 mg Oral BID   Continuous Infusions: . sodium chloride 1,000 mL (07/20/13 1636)  . sodium chloride     PRN Meds:.ALPRAZolam, hydrALAZINE, HYDROcodone-acetaminophen, hydrocortisone  Antibiotics    Anti-infectives   Start     Dose/Rate Route Frequency Ordered Stop   07/20/13 2030  cefTRIAXone (ROCEPHIN) 1 g in dextrose 5 % 50 mL IVPB  Status:  Discontinued     1 g 100 mL/hr over 30 Minutes Intravenous Every 24 hours 07/20/13 2016 07/20/13 2104   07/20/13 2015  cefTRIAXone (ROCEPHIN) 1 g in dextrose 5 % 50 mL IVPB     1 g 100 mL/hr over 30 Minutes Intravenous Every 24 hours 07/20/13 2006       Subjective:   Ariana [name] seen and examined today.  Patient states her headaches have improved.  She denies any dysuria.  She does states she had some fecal incontinence before admission.  She has had a urinary incontinence for years and follows up with urology.  She denies chest pain or shortness of breath, abdominal pain, nausea, vomiting.   Objective:   Filed Vitals:   07/20/13 2100 07/20/13 2155 07/21/13 0348 07/21/13 0630  BP: 160/78 161/67 169/76 146/71  Pulse:  54 66 61  Temp:  97.8 F (36.6 C) 98 F (36.7 C) 97.9 F (36.6 C)  TempSrc:  Oral Oral Oral  Resp: 13 16 16 16   Height:  5\' 3"  (1.6 m)    Weight:  46.584 kg (102 lb 11.2 oz)    SpO2:  97% 99% 99%    Wt Readings from Last 3 Encounters:  07/20/13 46.584 kg (102 lb 11.2 oz)  07/20/13 47.9 kg (105 lb 9.6 oz)  07/17/13 48.988 kg (108 lb)     No intake or output data in the 24 hours ending 07/21/13 0841  Exam  General: Well developed, well nourished, NAD, appears stated age  HEENT: NCAT, PERRLA, EOMI, Anicteic Sclera, mucous membranes moist.   Neck: Supple, no JVD, no masses  Cardiovascular: S1 S2 auscultated, no rubs, murmurs or gallops. Regular rate and rhythm.  Respiratory: Clear to auscultation bilaterally with equal chest rise  Abdomen: Soft, nontender, nondistended, + bowel sounds  Extremities: warm dry without cyanosis clubbing or edema  Neuro: AAOx3, cranial nerves grossly intact. Strength 5/5 in patient's upper and lower extremities bilaterally  Skin: Without rashes exudates or nodules  Psych: Normal affect and demeanor with intact judgement and insight  Data Review   Micro Results No results found for this or any previous visit (from the past 240 hour(s)).  Radiology Reports Ct Head Wo Contrast  07/20/2013   CLINICAL DATA:  Altered mental status.  EXAM: CT HEAD WITHOUT CONTRAST  TECHNIQUE: Contiguous axial images were obtained from the base of the skull through the vertex without intravenous contrast.  COMPARISON:  Head CT scan 04/20/2013.  FINDINGS: There is some cortical atrophy and chronic microvascular ischemic change. No evidence of acute intracranial abnormality including infarct, hemorrhage, mass lesion, mass effect, midline shift or abnormal extra-axial fluid collection is identified. 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 The calvarium is intact.  IMPRESSION: No acute finding.  Stable compared to prior exam.   Electronically Signed   By: Inge Rise M.D.   On: 07/20/2013 18:33   Ct Abdomen Pelvis W Contrast  07/20/2013   CLINICAL DATA:  Epigastric region pain and altered mental status; pulsatile abdominal mass on physical examination  EXAM: CT ABDOMEN AND PELVIS WITH CONTRAST  TECHNIQUE: Multidetector CT imaging of the abdomen and pelvis was performed using the standard protocol  following bolus administration of intravenous contrast. Oral contrast was also administered.  CONTRAST:  166mL OMNIPAQUE IOHEXOL 300 MG/ML  SOLN  COMPARISON:  CT abdomen and pelvis October 18, 2012  FINDINGS: There is mild bibasilar lung scarring. There is no edema or consolidation in the lung bases.  Liver is enlarged, measuring 18.4 cm in length. No focal liver lesions are identified. There is mild intrahepatic and extrahepatic biliary duct dilatation without mass or calculus seen. Gallbladder is absent, and these findings are probably due to post cholecystectomy state. They appear stable compared to the prior study.  Normal bilaterally.  There is a simple cyst in the anterior upper to mid pole left kidney measuring 1.1 by 1.0 cm. There is a stable mass in the left kidney laterally measuring 1.1 by 0.6 cm. This mass has attenuation values higher than is expected with a simple cyst. It is best seen on axial slice 14 series 3. There is a simple cyst in the posterior upper to midpole left kidney measuring 1.0 x 1.0 cm. There is a simple cyst in the lateral mid left kidney measuring 1.2 x 1.0 cm. There are no appreciable renal masses on the right. There is no hydronephrosis on either side. There is no renal or ureteral calculus on either side.  In the pelvis, the urinary bladder is midline and with normal wall thickness. A small amount of air in the urinary bladder is noted. The etiology for this air is uncertain. There is extensive diverticulosis in the sigmoid colon, and there is diffuse muscular hypertrophy in this area. There is no surrounding mesenteric inflammation. Note that several diverticula do abut the urinary bladder. A well-defined fistula is not seen, however. There is no pelvic mass or pelvic fluid collection. The appendix is absent.  There is mild wall thickening in the cecum without surrounding mesenteric inflammation. There is no bowel obstruction. No free air or portal venous air.  There is no  ascites, adenopathy, or abscess in the abdomen or pelvis. There is atherosclerotic change in the aorta. Aorta is tortuous but nonaneurysmal. There is marked disc space narrowing at L5-S1. There is degenerative change throughout the lumbar spine. There is no blastic or lytic bone lesion appreciable.  IMPRESSION: A small amount of air is noted  in the urinary bladder the etiology for this air is uncertain. Question if patient has been instrumented recently. If the patient has not been instrumented recently, further assessment may be warranted. Note that there are sigmoid diverticula immediately adjacent to the posterior wall of the urinary bladder. A direct fistula is not seen. The possibility of inflammation from the sigmoid colon causing subtle fistula leading to air in the urinary bladder may need to be considered; this finding may warrant direct visualization of the urinary bladder to assess the posterior wall region to exclude the possibility of such a fistula. Note, however, that the posterior urinary bladder wall does not appear significantly thickened.  Pilar Plate diverticulitis is not seen on this study.  Question mild colitis in the cecum region.  The liver is enlarged. No focal liver lesions are identified. Gallbladder is absent. Mild biliary duct dilatation is probably secondary to post cholecystectomy state.  No bowel obstruction. No abscess. Appendix absent. No hydronephrosis.  There are several renal cysts on the left. There is a small noncystic renal mass arising from lateral left kidney. This mass appears stable compared to prior study from September 2014. Given the appearance of this small mass, a followup CT or MR of the kidneys in approximately 6 months would be reasonable for further assessment.   Electronically Signed   By: Lowella Grip M.D.   On: 07/20/2013 18:44    CBC  Recent Labs Lab 07/20/13 1300 07/20/13 1630  WBC 5.4 6.5  HGB 12.5 12.9  HCT 39.6 37.7  PLT  --  192  MCV 94.9  90.8  MCH 30.0 31.1  MCHC 31.6* 34.2  RDW  --  12.8  LYMPHSABS  --  2.1  MONOABS  --  0.6  EOSABS  --  0.1  BASOSABS  --  0.0    Chemistries   Recent Labs Lab 07/20/13 1630 07/20/13 2017 07/21/13 0519  NA 142  --  144  K 2.9*  --  2.8*  CL 101  --  105  CO2 29  --  28  GLUCOSE 91  --  94  BUN 12  --  8  CREATININE 0.62  --  0.61  CALCIUM 10.1  --  9.3  MG  --  1.9  --   AST 46*  --   --   ALT 57*  --   --   ALKPHOS 96  --   --   BILITOT 0.7  --   --    ------------------------------------------------------------------------------------------------------------------ estimated creatinine clearance is 39.9 ml/min (by C-G formula based on Cr of 0.61). ------------------------------------------------------------------------------------------------------------------ No results found for this basename: HGBA1C,  in the last 72 hours ------------------------------------------------------------------------------------------------------------------ No results found for this basename: CHOL, HDL, LDLCALC, TRIG, CHOLHDL, LDLDIRECT,  in the last 72 hours ------------------------------------------------------------------------------------------------------------------ No results found for this basename: TSH, T4TOTAL, FREET3, T3FREE, THYROIDAB,  in the last 72 hours ------------------------------------------------------------------------------------------------------------------ No results found for this basename: VITAMINB12, FOLATE, FERRITIN, TIBC, IRON, RETICCTPCT,  in the last 72 hours  Coagulation profile No results found for this basename: INR, PROTIME,  in the last 168 hours  No results found for this basename: DDIMER,  in the last 72 hours  Cardiac Enzymes No results found for this basename: CK, CKMB, TROPONINI, MYOGLOBIN,  in the last 168 hours ------------------------------------------------------------------------------------------------------------------ No components  found with this basename: POCBNP,     Ariane Ditullio D.O. on 07/21/2013 at 8:41 AM  Between 7am to 7pm - Pager - [phone]  After 7pm go to [email or web address] -  password [email or web address]  And look for the night coverage person covering for me after hours  [facility] Office  [phone]

## 2013-07-21 NOTE — Evaluation (Signed)
Physical Therapy Evaluation Patient Details Name: Ariana [name] MRN: [id number] DOB: November 15, 1929 Today's Date: 07/21/2013   History of Present Illness  80 yo female admitted with hypokalemia, UTI. Hx of HTN, peripheral neuropathy, Sjogren's syndrome, dementia, gait abdnormality, fibromyalgia, chronic LBP.   Clinical Impression  On eval, pt required Min assist for mobility-able to ambulate ~140 feet while holding onto IV pole. Demonstrates general weakeness, decreased activity tolerance, and impaired gait and balance. Pt reports she has personal care aides 8 hours/day, 5 days/week. Recommend HHPT and intermittent supervision at discharge.    Follow Up Recommendations Home health PT;Supervision - Intermittent    Equipment Recommendations  None recommended by PT    Recommendations for Other Services OT consult     Precautions / Restrictions Precautions Precautions: Fall Restrictions Weight Bearing Restrictions: No      Mobility  Bed Mobility Overal bed mobility: Needs Assistance Bed Mobility: Supine to Sit;Sit to Supine     Supine to sit: Supervision Sit to supine: Supervision      Transfers Overall transfer level: Needs assistance   Transfers: Sit to/from Stand Sit to Stand: Supervision         General transfer comment: Pt c/o mild lightheadedness once she sat back down onto bed.   Ambulation/Gait Ambulation/Gait assistance: Min assist Ambulation Distance (Feet): 140 Feet Assistive device:  (IV pole) Gait Pattern/deviations: Decreased stride length;Trunk flexed;Step-through pattern     General Gait Details: slow gait speed. assist to stabilize throughout ambulation. fatigues fairly easily.   Stairs            Wheelchair Mobility    Modified Rankin (Stroke Patients Only)       Balance                                             Pertinent Vitals/Pain 7/10 headache. Repositioned back in bed at end of session HR: 91 bpm start of  session, 102 bpm during session, 80 bpm end of session    Home Living Family/patient expects to be discharged to:: Private residence Living Arrangements: Alone Available Help at Discharge: Personal care attendant (8 hours/day, 5 days a week) Type of Home: House Home Access: Stairs to enter Entrance Stairs-Rails: None Entrance Stairs-Number of Steps: 1 step up Home Layout: One level Home Equipment: Bedside commode;Cane - quad;Grab bars - tub/shower      Prior Function Level of Independence: Needs assistance      ADL's / Homemaking Assistance Needed: meals, light housekeeping, grocery shopping        Hand Dominance        Extremity/Trunk Assessment   Upper Extremity Assessment: Generalized weakness           Lower Extremity Assessment: Generalized weakness      Cervical / Trunk Assessment: Kyphotic  Communication   Communication: No difficulties  Cognition Arousal/Alertness: Awake/alert Behavior During Therapy: WFL for tasks assessed/performed Overall Cognitive Status: Within Functional Limits for tasks assessed                      General Comments      Exercises        Assessment/Plan    PT Assessment Patient needs continued PT services  PT Diagnosis Difficulty walking;Generalized weakness;Acute pain   PT Problem List Decreased strength;Decreased activity tolerance;Decreased balance;Decreased mobility;Pain;Decreased knowledge of use of DME  PT Treatment Interventions DME  instruction;Gait training;Functional mobility training;Therapeutic activities;Therapeutic exercise;Patient/family education;Balance training   PT Goals (Current goals can be found in the Care Plan section) Acute Rehab PT Goals Patient Stated Goal: to return home.  PT Goal Formulation: With patient Time For Goal Achievement: 08/04/13 Potential to Achieve Goals: Good    Frequency Min 3X/week   Barriers to discharge        Co-evaluation               End of  Session Equipment Utilized During Treatment: Gait belt Activity Tolerance: Patient limited by fatigue Patient left: in bed;with call bell/phone within reach;with bed alarm set           Time: 2952-8413 PT Time Calculation (min): 24 min   Charges:   PT Evaluation $Initial PT Evaluation Tier I: 1 Procedure PT Treatments $Gait Training: 23-37 mins   PT G Codes:          Weston Anna, MPT Pager: [phone]

## 2013-07-21 NOTE — Progress Notes (Signed)
CRITICAL VALUE ALERT  Critical value received:  K+ 2.8  Date of notification:  [id number]  Time of notification:  0615  Critical value read back:y  Nurse who received alert:  Sarah  MD notified (1st page):  Viyoh  Time of first page: 0620  MD notified (2nd page):na  Time of second page:na  Responding MD:  Inis Sizer  Time MD responded:  [phone]

## 2013-07-22 DIAGNOSIS — F411 Generalized anxiety disorder: Secondary | ICD-10-CM

## 2013-07-22 DIAGNOSIS — R269 Unspecified abnormalities of gait and mobility: Secondary | ICD-10-CM

## 2013-07-22 DIAGNOSIS — I059 Rheumatic mitral valve disease, unspecified: Secondary | ICD-10-CM

## 2013-07-22 DIAGNOSIS — H04129 Dry eye syndrome of unspecified lacrimal gland: Secondary | ICD-10-CM

## 2013-07-22 DIAGNOSIS — F341 Dysthymic disorder: Secondary | ICD-10-CM

## 2013-07-22 DIAGNOSIS — E43 Unspecified severe protein-calorie malnutrition: Secondary | ICD-10-CM

## 2013-07-22 LAB — BASIC METABOLIC PANEL
BUN: 12 mg/dL (ref 6–23)
CHLORIDE: 106 meq/L (ref 96–112)
CO2: 25 mEq/L (ref 19–32)
CREATININE: 0.64 mg/dL (ref 0.50–1.10)
Calcium: 9.4 mg/dL (ref 8.4–10.5)
GFR, EST NON AFRICAN AMERICAN: 81 mL/min — AB (ref >90)
Glucose, Bld: 104 mg/dL — ABNORMAL HIGH (ref 70–99)
Potassium: 3.6 mEq/L — ABNORMAL LOW (ref 3.7–5.3)
Sodium: 141 mEq/L (ref 137–147)

## 2013-07-22 LAB — CBC
HCT: 38.2 % (ref 36.0–46.0)
Hemoglobin: 13 g/dL (ref 12.0–15.0)
MCH: 30.4 pg (ref 26.0–34.0)
MCHC: 34 g/dL (ref 30.0–36.0)
MCV: 89.5 fL (ref 78.0–100.0)
PLATELETS: 213 10*3/uL (ref 150–400)
RBC: 4.27 MIL/uL (ref 3.87–5.11)
RDW: 12.8 % (ref 11.5–15.5)
WBC: 6.8 10*3/uL (ref 4.0–10.5)

## 2013-07-22 MED ORDER — BOOST PLUS PO LIQD: 237.0000 mL | Freq: Two times a day (BID) | ORAL | Status: DC

## 2013-07-22 MED ORDER — HYDROCODONE-ACETAMINOPHEN 5-325 MG PO TABS: 1.0000 | ORAL_TABLET | Freq: Three times a day (TID) | ORAL | Status: DC | PRN

## 2013-07-22 MED ORDER — POTASSIUM CHLORIDE 20 MEQ/15ML (10%) PO LIQD: 20.0000 meq | Freq: Every day | ORAL | Status: DC

## 2013-07-22 MED ORDER — HYDROCODONE-ACETAMINOPHEN 5-325 MG PO TABS
1.0000 | ORAL_TABLET | Freq: Once | ORAL | Status: AC
  Administered 2013-07-22: 1 via ORAL
  Filled 2013-07-22: qty 1

## 2013-07-22 MED ORDER — DULOXETINE HCL 30 MG PO CPEP: 30.0000 mg | ORAL_CAPSULE | Freq: Every day | ORAL | Status: DC

## 2013-07-22 NOTE — Progress Notes (Signed)
Patient and daughter given discharge instructions, and verbalized an understanding of all discharge instructions.  Patient and daughter agrees with discharge plan, and is being discharged in stable medical condition.  Patient given transportation via wheelchair.  Durwin Nora RN

## 2013-07-22 NOTE — Evaluation (Signed)
Occupational Therapy Evaluation Patient Details Name: Ariana [name] MRN: [id number] DOB: December 06, 1929 Today's Date: 07/22/2013    History of Present Illness 80 yo female admitted with hypokalemia, UTI. Hx of HTN, peripheral neuropathy, Sjogren's syndrome, dementia, gait abdnormality, fibromyalgia, chronic LBP.    Clinical Impression   Pt overall at min guard to min assist level with ADL and is mildly unsteady during these tasks. She will benefit from OT while on acute to increase strength and independence with self care tasks. Recommend initial 24/7 at d/c for safety.     Follow Up Recommendations  No OT follow up;Supervision/Assistance - 24 hour    Equipment Recommendations  None recommended by OT    Recommendations for Other Services       Precautions / Restrictions Precautions Precautions: Fall Restrictions Weight Bearing Restrictions: No      Mobility Bed Mobility   Bed Mobility: Supine to Sit     Supine to sit: Supervision Sit to supine: Supervision      Transfers Overall transfer level: Needs assistance Equipment used: None Transfers: Sit to/from Stand Sit to Stand: Min assist;Min guard         General transfer comment: min assist from the bed as pt trying to stand with her hips at an angle. min guard assist from 3in1.     Balance                                            ADL Overall ADL's : Needs assistance/impaired Eating/Feeding: Independent;Sitting   Grooming: Wash/dry hands;Standing;Min guard   Upper Body Bathing: Set up;Sitting   Lower Body Bathing: Min guard;Sit to/from stand   Upper Body Dressing : Set up;Sitting   Lower Body Dressing: Min guard   Toilet Transfer: Minimal assistance;Ambulation;BSC   Toileting- Water quality scientist and Hygiene: Min guard;Sit to/from stand         General ADL Comments: Pt unsteady with transfer to toilet without assistive device. She was able to manage own underwear and gown  with min guard assist. Needed min cues to remember to wash her hands after toileting. Discussed safety with showering and that OT recommends either sponge bathing a few days initially or obtaining a tubseat for her tub rather than getting down into the bottom of the tub for a bath. Pt is a little unsteady currently. Discussed where to obtain tub DME and pt states she may have a tubseat at home packed away.     Vision                     Perception     Praxis      Pertinent Vitals/Pain Pt states some discomfort in her mouth (reports her dentures dont fit right anymore)     Hand Dominance     Extremity/Trunk Assessment Upper Extremity Assessment Upper Extremity Assessment: Generalized weakness           Communication Communication Communication: No difficulties   Cognition Arousal/Alertness: Awake/alert Behavior During Therapy: WFL for tasks assessed/performed Overall Cognitive Status: Within Functional Limits for tasks assessed                     General Comments       Exercises       Shoulder Instructions      Home Living Family/patient expects to be discharged to:: Private residence Living Arrangements:  Alone Available Help at Discharge: Personal care attendant (8 hours/day, 5 days a week) Type of Home: House Home Access: Stairs to enter [employer] of Steps: 1 step up Entrance Stairs-Rails: None Home Layout: One level     Bathroom Shower/Tub: Teacher, early years/pre: Standard     Home Equipment: Bedside commode;Cane - quad;Grab bars - tub/shower   Additional Comments: states she may have a tubseat.      Prior Functioning/Environment Level of Independence: Needs assistance  Gait / Transfers Assistance Needed: states she uses a single point cane when she goes out of the house. ADL's / Homemaking Assistance Needed: meals, light housekeeping, grocery shopping. supervision with showering. usually gets down into the tub  for a bath. states she can dress and toilet herself.        OT Diagnosis: Generalized weakness   OT Problem List: Decreased strength;Decreased knowledge of use of DME or AE   OT Treatment/Interventions: Self-care/ADL training;Patient/family education;Therapeutic activities;DME and/or AE instruction    OT Goals(Current goals can be found in the care plan section) Acute Rehab OT Goals Patient Stated Goal: to return home.  OT Goal Formulation: With patient Time For Goal Achievement: 08/05/13 Potential to Achieve Goals: Good  OT Frequency: Min 2X/week   Barriers to D/C:            Co-evaluation              End of Session Equipment Utilized During Treatment: Gait belt  Activity Tolerance: Patient tolerated treatment well Patient left: in bed;with call bell/phone within reach;with bed alarm set   Time: 3354-5625 OT Time Calculation (min): 23 min Charges:  OT General Charges $OT Visit: 1 Procedure OT Evaluation $Initial OT Evaluation Tier I: 1 Procedure OT Treatments $Therapeutic Activity: 8-22 mins G-Codes:    Jules Schick [phone] 07/22/2013, 9:37 AM

## 2013-07-22 NOTE — Discharge Summary (Signed)
Physician Discharge Summary  Ariana [name] VZD:[id number] DOB: [birth year] DOA: 07/20/2013  PCP: Robyn Haber, MD  Admit date: 07/20/2013 Discharge date: 07/22/2013  Time spent: 45 minutes  Recommendations for Outpatient Follow-up:  Patient will be discharged to home  with home health, PT. She will need to follow up with her primary care physician within one week of discharge as well as have a BMP drawn to monitor potassium. Patient to continue taking her medications as prescribed. She will need discuss her medications with her primary care physician, whether to continue or discontinue them due to her QT prolongation.  Discharge Diagnoses:  Active Problems:   ANXIETY DEPRESSION   NEUROPATHY   HYPERTENSION, BENIGN   SJOGREN'S SYNDROME   URINARY INCONTINENCE   Hypokalemia   UTI (lower urinary tract infection)   Prolonged QT interval   Protein-calorie malnutrition, severe   Discharge Condition: Stable  Diet recommendation: Cardiac healthy  Filed Weights   07/20/13 1522 07/20/13 2155  Weight: 47.628 kg (105 lb) 46.584 kg (102 lb 11.2 oz)    History of present illness:  Ariana [name] is a 80 y.o. female with prior history og sjogrens syndrome, hypertension, IBS, urinary incontinence, fibromyalgia, dementia, chronic headaches, comes in after seeing her PCP for elevated blood pressures. She reports she woke up all sweaty this morning and her BP was found to be in 200/90's, with headaches. No nausea or vomiting, no chest pain, sob or palpitations. She reported intermittent fecal incontinence and constipaton with her history of IBS. She denied any abdominal pain at this time. At time of admission, her headache had improved. She was seen by her PCP and sent to ED, for evaluation of abdominal pulsatile mass. She underwent CT abdomen and pelvis which did not reveal AAA,. There was some air found in the bladder with unknown etiology, diverticulosis in the sigmoid colon. There was some concern  about a fistula from the posterior urinary bladder to the sigmoid colon, but its not clear. She dis not appear to have any complaints with abdominal pain or dysuria. She also underwent CT head without contrast for evaluation of headaches, which was essentially stable when compared to previous exam. Patient does ramble on, very tangential, had to stop her in the middle of the conversation to focus on the questions. She was very concerned about the medications that she was taking and she does not feel comfortable taking them. She denied recent falls. No family at bedside. On her routine lab work she was found to be hypokalemic and ua revealed a mild UTI. EKG showed prolonged qt interval. She was referred to medical service for admission for further management.   Hospital Course:  Accelerated hypertension  -Resolved -Continue lisinopril, metoprolol, Cardizem -Patient should also primary care physician to address her blood pressure control  Prolonged QT interval/Asymptomatic SVT  -Likely secondary to hypokalemia vs hydroxychloroquine, pilocarpine, aricept, topamax  -Discontinued ceftriaxone and aricept, pilocarpine  -Potassium was repleted, magnesium was also given- 3 g -Will continue topamax as there can be withdrawal seizures when suddenly discontinued -Cardiology was consulted, had no recommendations other than to replete potassium and hold QT prolonging medications -Repeat EKG showed QTc improved 460 -Patient will need to follow up with her primary care physician and discuss these medications and whether to continue or discontinue them permanently.  Urinary tract infection  -UA: WBC 7-10, small leukocytes  -Patient follows up with urology, Lillette Boxer. Dahlstedt  -Contacted Dr. Dahlstedt's office and patient has a long history of recurrent UTI's and  is only treated if culture positive. Several treatments for overactive bladder have been tried but were not continued. Patient follows up with urology  as needed. Her last culture in March 2015 was negative.  -Was given ceftriaxone, however, will discontinue due to prolonged QT  -Urine culture showed 80,000 colonies, multiple bacterial morphotypes are present none predominant.  Hypokalemia  -Magnesium 1.9, given 3 g of magnesium sulfate  -Potassium is improving, patient will need to have BMP within one week of discharge with her primary care physician.  Sjogren's  -Plaquenil currently held   Chronic headaches  -Continue topamax  -Improved, likely secondary to hypertension   IBS  -Patient had some fecal incontinence before being admitted.  -Resolved   Depression/Anxiety  -Will continue cymbalta 30mg  daily (although this can increase QT) as there is risk of seizures if discontinued abruptly.  -Continue xanax  Severe malnutrition -Nutrition consulted, continue boost supplements  Procedures: None  Consultations: Cardiology PCP, Dr. Joseph Art, via phone  Discharge Exam: Filed Vitals:   07/22/13 1009  BP: 136/71  Pulse: 78  Temp: 98.1 F (36.7 C)  Resp: 20   Exam  General: Well developed, well nourished, NAD, appears stated age  HEENT: NCAT, mucous membranes moist.  Neck: Supple, no JVD, no masses  Cardiovascular: S1 S2 auscultated, no rubs, murmurs or gallops. Regular rate and rhythm.  Respiratory: Clear to auscultation bilaterally with equal chest rise  Abdomen: Soft, nontender, nondistended, + bowel sounds  Extremities: warm dry without cyanosis clubbing or edema  Neuro: AAOx3,no focal deficits Skin: Without rashes exudates or nodules  Psych: Anxious, extremely tangential.  Discharge Instructions      Discharge Instructions   Diet - low sodium heart healthy    Complete by:  As directed      Discharge instructions    Complete by:  As directed   Patient will be discharged to home with home health, PT. She will need to follow up with her primary care physician within one week of discharge as well as have a  BMP drawn to monitor potassium. Patient to continue taking her medications as prescribed. She will need discuss her medications with her primary care physician, whether to continue or discontinue them due to her QT prolongation.     Increase activity slowly    Complete by:  As directed             Medication List    STOP taking these medications       donepezil 10 MG tablet  Commonly known as:  ARICEPT     pilocarpine 5 MG tablet  Commonly known as:  SALAGEN      TAKE these medications       ALPRAZolam 0.5 MG tablet  Commonly known as:  XANAX  Take 0.5-1 mg by mouth. Takes 0.5mg  in am, 0.5mg  at 2pm, and 1mg  at bedtime.     aspirin EC 81 MG tablet  Take 81 mg by mouth daily.     CALCIUM CARBONATE PO  Take 1 tablet by mouth daily.     CRANBERRY SOFT PO  Take 1 capsule by mouth daily.     diltiazem 120 MG 24 hr capsule  Commonly known as:  TIAZAC  Take 120 mg by mouth daily.     DULoxetine 30 MG capsule  Commonly known as:  CYMBALTA  Take 1 capsule (30 mg total) by mouth daily.     HYDROcodone-acetaminophen 5-325 MG per tablet  Commonly known as:  NORCO/VICODIN  Take 1  tablet by mouth 3 (three) times daily as needed for moderate pain. MAX 3 tablets PER DAY     hydroxychloroquine 200 MG tablet  Commonly known as:  PLAQUENIL  Take 200 mg by mouth daily.     lactose free nutrition Liqd  Take 237 mLs by mouth 2 (two) times daily between meals.     lansoprazole 30 MG capsule  Commonly known as:  PREVACID  Take 30 mg by mouth daily at 12 noon.     lisinopril 10 MG tablet  Commonly known as:  PRINIVIL,ZESTRIL  Take 1 tablet (10 mg total) by mouth daily.     metoprolol 50 MG tablet  Commonly known as:  LOPRESSOR  Take 1 tablet (50 mg total) by mouth 2 (two) times daily.     nystatin 100000 UNIT/ML suspension  Commonly known as:  MYCOSTATIN  Take 5 mLs (500,000 Units total) by mouth 4 (four) times daily.     polyethylene glycol packet  Commonly known as:   MIRALAX / GLYCOLAX  Take 17 g by mouth at bedtime.     potassium chloride 20 MEQ/15ML (10%) solution  Take 15 mLs (20 mEq total) by mouth daily.     REFRESH TEARS 0.5 % Soln  Generic drug:  carboxymethylcellulose  1 drop 2 (two) times daily as needed (dry eyes).     sodium chloride 0.65 % Soln nasal spray  Commonly known as:  OCEAN  Place 1 spray into the nose as needed for congestion.     topiramate 25 MG tablet  Commonly known as:  TOPAMAX  Take 1 tablet (25 mg total) by mouth 2 (two) times daily.       Allergies  Allergen Reactions  . Codeine Nausea Only    unless given with Phenergan  . Doxycycline     Unknown  . Klonopin [Clonazepam]     Causes hallucination   . Meperidine Hcl Nausea Only    unless given with Phenergan  . Naproxen   . Norflex [Orphenadrine Citrate] Nausea Only    Unless given with Phenergan  . Oxycodone-Acetaminophen Nausea Only    unless given with phenergan  . Penicillins     Unknown  . Phenothiazines     Unknown  . Propoxyphene Hcl Nausea Only    unless given with phenergan  . Stelazine     Unknown  . Sulfamethoxazole-Trimethoprim     Unknown  . Tolectin [Tolmetin Sodium]     Unknown  . Tramadol     Unknown  . Zoloft [Sertraline Hcl]     Caused pt to sleep a lot  . Lubiprostone Rash   Follow-up Information   Follow up with [facility]. (For home health PT servcies.)    Specialty:  [facility] information:   [address] [place] [address] [phone]        The results of significant diagnostics from this hospitalization (including imaging, microbiology, ancillary and laboratory) are listed below for reference.    Significant Diagnostic Studies: Ct Head Wo Contrast  07/20/2013   CLINICAL DATA:  Altered mental status.  EXAM: CT HEAD WITHOUT CONTRAST  TECHNIQUE: Contiguous axial images were obtained from the base of the skull through the vertex without intravenous contrast.   COMPARISON:  Head CT scan 04/20/2013.  FINDINGS: There is some cortical atrophy and chronic microvascular ischemic change. No evidence of acute intracranial abnormality including infarct, hemorrhage, mass lesion, mass effect, midline shift or abnormal extra-axial fluid  collection is identified. 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 The calvarium is intact.  IMPRESSION: No acute finding.  Stable compared to prior exam.   Electronically Signed   By: Inge Rise M.D.   On: 07/20/2013 18:33   Ct Abdomen Pelvis W Contrast  07/20/2013   CLINICAL DATA:  Epigastric region pain and altered mental status; pulsatile abdominal mass on physical examination  EXAM: CT ABDOMEN AND PELVIS WITH CONTRAST  TECHNIQUE: Multidetector CT imaging of the abdomen and pelvis was performed using the standard protocol following bolus administration of intravenous contrast. Oral contrast was also administered.  CONTRAST:  171mL OMNIPAQUE IOHEXOL 300 MG/ML  SOLN  COMPARISON:  CT abdomen and pelvis October 18, 2012  FINDINGS: There is mild bibasilar lung scarring. There is no edema or consolidation in the lung bases.  Liver is enlarged, measuring 18.4 cm in length. No focal liver lesions are identified. There is mild intrahepatic and extrahepatic biliary duct dilatation without mass or calculus seen. Gallbladder is absent, and these findings are probably due to post cholecystectomy state. They appear stable compared to the prior study.  Normal bilaterally.  There is a simple cyst in the anterior upper to mid pole left kidney measuring 1.1 by 1.0 cm. There is a stable mass in the left kidney laterally measuring 1.1 by 0.6 cm. This mass has attenuation values higher than is expected with a simple cyst. It is best seen on axial slice 14 series 3. There is a simple cyst in the posterior upper to midpole left kidney measuring 1.0 x 1.0 cm. There is a simple cyst in the lateral mid left kidney measuring 1.2 x 1.0 cm. There are no  appreciable renal masses on the right. There is no hydronephrosis on either side. There is no renal or ureteral calculus on either side.  In the pelvis, the urinary bladder is midline and with normal wall thickness. A small amount of air in the urinary bladder is noted. The etiology for this air is uncertain. There is extensive diverticulosis in the sigmoid colon, and there is diffuse muscular hypertrophy in this area. There is no surrounding mesenteric inflammation. Note that several diverticula do abut the urinary bladder. A well-defined fistula is not seen, however. There is no pelvic mass or pelvic fluid collection. The appendix is absent.  There is mild wall thickening in the cecum without surrounding mesenteric inflammation. There is no bowel obstruction. No free air or portal venous air.  There is no ascites, adenopathy, or abscess in the abdomen or pelvis. There is atherosclerotic change in the aorta. Aorta is tortuous but nonaneurysmal. There is marked disc space narrowing at L5-S1. There is degenerative change throughout the lumbar spine. There is no blastic or lytic bone lesion appreciable.  IMPRESSION: A small amount of air is noted in the urinary bladder the etiology for this air is uncertain. Question if patient has been instrumented recently. If the patient has not been instrumented recently, further assessment may be warranted. Note that there are sigmoid diverticula immediately adjacent to the posterior wall of the urinary bladder. A direct fistula is not seen. The possibility of inflammation from the sigmoid colon causing subtle fistula leading to air in the urinary bladder may need to be considered; this finding may warrant direct visualization of the urinary bladder to assess the posterior wall region to exclude the possibility of such a fistula. Note, however, that the posterior urinary bladder wall does not appear significantly thickened.  Pilar Plate diverticulitis is not seen  on this study.   Question mild colitis in the cecum region.  The liver is enlarged. No focal liver lesions are identified. Gallbladder is absent. Mild biliary duct dilatation is probably secondary to post cholecystectomy state.  No bowel obstruction. No abscess. Appendix absent. No hydronephrosis.  There are several renal cysts on the left. There is a small noncystic renal mass arising from lateral left kidney. This mass appears stable compared to prior study from September 2014. Given the appearance of this small mass, a followup CT or MR of the kidneys in approximately 6 months would be reasonable for further assessment.   Electronically Signed   By: Lowella Grip M.D.   On: 07/20/2013 18:44    Microbiology: Recent Results (from the past 240 hour(s))  URINE CULTURE     Status: None   Collection Time    07/20/13  5:09 PM      Result Value Ref Range Status   Specimen Description URINE, RANDOM   Final   Special Requests NONE   Final   Culture  Setup Time     Final   Value: 07/21/2013 03:16     Performed at [facility]     Final   Value: 80,000 COLONIES/ML     Performed at [facility]   Culture     Final   Value: Multiple bacterial morphotypes present, none predominant. Suggest appropriate recollection if clinically indicated.     Performed at [facility]   Report Status 07/21/2013 FINAL   Final     Labs: Basic Metabolic Panel:  Recent Labs Lab 07/20/13 1630 07/20/13 2017 07/21/13 0519 07/21/13 1843 07/22/13 0517  NA 142  --  144  --  141  K 2.9*  --  2.8* 3.3* 3.6*  CL 101  --  105  --  106  CO2 29  --  28  --  25  GLUCOSE 91  --  94  --  104*  BUN 12  --  8  --  12  CREATININE 0.62  --  0.61  --  0.64  CALCIUM 10.1  --  9.3  --  9.4  MG  --  1.9  --   --   --    Liver Function Tests:  Recent Labs Lab 07/20/13 1630  AST 46*  ALT 57*  ALKPHOS 96  BILITOT 0.7  PROT 7.2  ALBUMIN 4.1    Recent Labs Lab 07/20/13 1630  LIPASE 41   No  results found for this basename: AMMONIA,  in the last 168 hours CBC:  Recent Labs Lab 07/20/13 1300 07/20/13 1630 07/22/13 0517  WBC 5.4 6.5 6.8  NEUTROABS  --  3.7  --   HGB 12.5 12.9 13.0  HCT 39.6 37.7 38.2  MCV 94.9 90.8 89.5  PLT  --  192 213   Cardiac Enzymes: No results found for this basename: CKTOTAL, CKMB, CKMBINDEX, TROPONINI,  in the last 168 hours BNP: BNP (last 3 results) No results found for this basename: PROBNP,  in the last 8760 hours CBG: No results found for this basename: GLUCAP,  in the last 168 hours     Signed:  Cristal Ford  [facility] 07/22/2013, 11:14 AM

## 2013-07-22 NOTE — Progress Notes (Signed)
Echocardiogram 2D Echocardiogram has been performed.  Ariana [name] 07/22/2013, 9:52 AM

## 2013-07-30 ENCOUNTER — Ambulatory Visit (INDEPENDENT_AMBULATORY_CARE_PROVIDER_SITE_OTHER): Payer: Medicare Other | Admitting: Family Medicine

## 2013-07-30 ENCOUNTER — Ambulatory Visit (INDEPENDENT_AMBULATORY_CARE_PROVIDER_SITE_OTHER): Payer: Medicare Other

## 2013-07-30 VITALS — BP 96/63 | HR 53 | Temp 98.7°F | Resp 20

## 2013-07-30 DIAGNOSIS — R109 Unspecified abdominal pain: Secondary | ICD-10-CM

## 2013-07-30 DIAGNOSIS — R55 Syncope and collapse: Secondary | ICD-10-CM

## 2013-07-30 DIAGNOSIS — K921 Melena: Secondary | ICD-10-CM

## 2013-07-30 DIAGNOSIS — R001 Bradycardia, unspecified: Secondary | ICD-10-CM

## 2013-07-30 DIAGNOSIS — F0390 Unspecified dementia without behavioral disturbance: Secondary | ICD-10-CM

## 2013-07-30 DIAGNOSIS — I498 Other specified cardiac arrhythmias: Secondary | ICD-10-CM

## 2013-07-30 LAB — POCT CBC
GRANULOCYTE PERCENT: 60.7 % (ref 37–80)
HEMATOCRIT: 42.1 % (ref 37.7–47.9)
HEMOGLOBIN: 13.4 g/dL (ref 12.2–16.2)
Lymph, poc: 2.5 (ref 0.6–3.4)
MCH, POC: 30.5 pg (ref 27–31.2)
MCHC: 31.8 g/dL (ref 31.8–35.4)
MCV: 95.8 fL (ref 80–97)
MID (cbc): 0.5 (ref 0–0.9)
MPV: 9.2 fL (ref 0–99.8)
POC Granulocyte: 4.6 (ref 2–6.9)
POC LYMPH %: 32.8 % (ref 10–50)
POC MID %: 6.5 %M (ref 0–12)
Platelet Count, POC: 279 10*3/uL (ref 142–424)
RBC: 4.39 M/uL (ref 4.04–5.48)
RDW, POC: 13.1 %
WBC: 7.5 10*3/uL (ref 4.6–10.2)

## 2013-07-30 LAB — IFOBT (OCCULT BLOOD): IFOBT: NEGATIVE

## 2013-07-30 NOTE — Progress Notes (Addendum)
[facility] [address], [place] [address] [phone]  Date:  07/30/2013   Name:  Ariana [name]   DOB:  05/02/1929   MRN:  [id number]  PCP:  Robyn Haber, MD    Chief Complaint: Near Syncope, Dizziness and Headache   History of Present Illness:  Ariana [name] is a 80 y.o. very pleasant female patient who presents with the following:  She was seen at [facility] with abdominal pain on 6/7- she was also noted to have rambling speech pattern and a pulsatile abdominal mass.  She was admitted from 6/7 to 6/9 with a UTI and other chronic medical problems.  I do not see where she was released with abx.  Her urine culture grew out mixed bacteria, 80K CFU so she did not have a true UTI.  CT scan from admission showed the following:  CT abd pelvis 6/7: IMPRESSION:  A small amount of air is noted in the urinary bladder the etiology  for this air is uncertain. Question if patient has been instrumented  recently. If the patient has not been instrumented recently, further  assessment may be warranted. Note that there are sigmoid diverticula  immediately adjacent to the posterior wall of the urinary bladder. A  direct fistula is not seen. The possibility of inflammation from the  sigmoid colon causing subtle fistula leading to air in the urinary  bladder may need to be considered; this finding may warrant direct  visualization of the urinary bladder to assess the posterior wall  region to exclude the possibility of such a fistula. Note, however,  that the posterior urinary bladder wall does not appear  significantly thickened.  Pilar Plate diverticulitis is not seen on this study.  Question mild colitis in the cecum region.  The liver is enlarged. No focal liver lesions are identified.  Gallbladder is absent. Mild biliary duct dilatation is probably  secondary to post cholecystectomy state.  No bowel obstruction. No abscess. Appendix absent. No  hydronephrosis.  There are  several renal cysts on the left. There is a small  noncystic renal mass arising from lateral left kidney. This mass  appears stable compared to prior study from September 2014. Given  the appearance of this small mass, a followup CT or MR of the  kidneys in approximately 6 months would be reasonable for further  Assessment.  She is here today with dark stools, pains in her abdomen and leg.  Apparently she "nearly passed out" 2 days ago while a serviceman was working on something at her house.  She did not lose consciousness or fall.  This event was not observed by anyone in her family.   She notes a history of neuropathy in her right leg which is being followed by Dr. Jannifer Franklin.   They have also noted "bad headache" for which she is taking some sort of medication  She also fell back in December and hurt her back. Her home aid reports that she is also not clear on why she is here today- it seems her daughter wanted her to be taken to the doctor yesterday but she was instead taken to have her hair done by a different home aid.   I am not able to get a significant history from the patient due to her dementia.  Called her daughter who states she is here today more for a post- hospitalization recheck.   She needs to follow-up her potassium.  She is also set up to see  a geriatrician next week.  Discussed her low BP and pulse.  I will send written instructions home regarding her medications (will speak with cardiology first).  Also, asked her daughter to call [facility] and schedule a follow-up appt for her asap.  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    Patient Active Problem List   Diagnosis Date Noted  . Protein-calorie malnutrition, severe 07/22/2013  . Hypokalemia 07/20/2013  . UTI (lower urinary tract infection) 07/20/2013  . Prolonged QT interval 07/20/2013  . Osteopenia 07/17/2013  . Palpitations 06/30/2013  . Unspecified  hereditary and idiopathic peripheral neuropathy 05/22/2013  . Abnormality of gait 12/05/2012  . Headache(784.0) 09/05/2012  . Multinodular thyroid 01/15/2012  . Neck pain 01/15/2012  . Generalized anxiety disorder 07/20/2011    Class: Chronic  . Senile dementia 07/14/2011  . Hypercholesterolemia 07/08/2010  . DRY EYE SYNDROME 07/06/2009  . 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 URINARY INCONTINENCE 01/06/2009  . COLONIC POLYPS, ADENOMATOUS, HX OF 04/15/2008  . MITRAL VALVE PROLAPSE 11/05/2007  . Memory loss 11/05/2007  . ADVERSE DRUG REACTION 11/05/2007  . ANXIETY DEPRESSION 07/02/2007  . NEUROPATHY 07/02/2007  . HYPERTENSION, BENIGN 07/02/2007  . GERD 07/02/2007  . Irritable bowel syndrome 07/02/2007  . LOW BACK PAIN SYNDROME 07/02/2007  . FIBROMYALGIA 07/02/2007    Past Medical History  Diagnosis Date  . Sjogren's syndrome   . Dry eye syndrome   . Hypertension, benign   . Mitral valve prolapse   . GERD (gastroesophageal reflux disease)   . Diverticulosis of colon   . Irritable bowel syndrome   . Urinary incontinence   . Low back pain syndrome   . Fibromyalgia   . Memory loss   . Anxiety and depression   . History of adverse drug reaction   . Peripheral neuropathy     "both feet and legs"  . Shortness of breath 07/18/11    "alot lately"  . Anginal pain   . History of recurrent UTIs   . H/O hiatal hernia   . Anxiety   . Dementia   . Depression   . Abnormality of gait   . Thyroid nodule   . Headache(784.0) 09/05/2012  . Adenomatous polyp of colon 2002    38mm    Past Surgical History  Procedure Laterality Date  . Vesicovaginal fistula closure w/ tah    . Appendectomy    . Cholecystectomy  2000  . Mandible surgery    . Temporomandibular joint surgery  1986  . Cataract extraction, bilateral    . Abdominal hysterectomy  1967  . Dental surgery      multiple tooth extractions  . Esophagogastroduodenoscopy (egd) with esophageal dilation N/A 08/23/2012    Procedure:  ESOPHAGOGASTRODUODENOSCOPY (EGD) WITH ESOPHAGEAL DILATION;  Surgeon: Milus Banister, MD;  Location: [facility] ENDOSCOPY;  Service: Endoscopy;  Laterality: N/A;  . Colonoscopy w/ biopsies      multiple  . Nasal septum surgery  1980  . Transthoracic echocardiogram  2001    mild LVH, normal LV  . Nm myocar perf wall motion  2003    persantine - normal static and dynamic study w/apical thinning and presvered LV function, no ischemia  . Cardiac catheterization  02/17/2003    normal L main, LAD free of disease, Cfx free of disease, RCA free of disease (Dr. Rockne Menghini)    History  Substance Use Topics  . Smoking status: Former Research scientist (life sciences)  . Smokeless  tobacco: Never Used  . Alcohol Use: No    Family History  Problem Relation Age of Onset  . Colon cancer Sister   . Heart disease Father     heart attack  . Pneumonia Mother   . Heart attack Mother   . Hypertension Mother   . Throat cancer Brother   . Hypertension Maternal Grandmother     Allergies  Allergen Reactions  . Codeine Nausea Only    unless given with Phenergan  . Doxycycline     Unknown  . Klonopin [Clonazepam]     Causes hallucination   . Meperidine Hcl Nausea Only    unless given with Phenergan  . Naproxen   . Norflex [Orphenadrine Citrate] Nausea Only    Unless given with Phenergan  . Oxycodone-Acetaminophen Nausea Only    unless given with phenergan  . Penicillins     Unknown  . Phenothiazines     Unknown  . Propoxyphene Hcl Nausea Only    unless given with phenergan  . Stelazine     Unknown  . Sulfamethoxazole-Trimethoprim     Unknown  . Tolectin [Tolmetin Sodium]     Unknown  . Tramadol     Unknown  . Zoloft [Sertraline Hcl]     Caused pt to sleep a lot  . Lubiprostone Rash    Medication list has been reviewed and updated.  Current Outpatient Prescriptions on File Prior to Visit  Medication Sig Dispense Refill  . ALPRAZolam (XANAX) 0.5 MG tablet Take 0.5-1 mg by mouth. Takes 0.5mg  in am, 0.5mg  at 2pm, and  1mg  at bedtime.      Marland Kitchen aspirin EC 81 MG tablet Take 81 mg by mouth daily.      Marland Kitchen CALCIUM CARBONATE PO Take 1 tablet by mouth daily.      . carboxymethylcellulose (REFRESH TEARS) 0.5 % SOLN 1 drop 2 (two) times daily as needed (dry eyes).       Drusilla Kanner SOFT PO Take 1 capsule by mouth daily.      Marland Kitchen diltiazem (TIAZAC) 120 MG 24 hr capsule Take 120 mg by mouth daily.      . DULoxetine (CYMBALTA) 30 MG capsule Take 1 capsule (30 mg total) by mouth daily.  30 capsule  0  . hydroxychloroquine (PLAQUENIL) 200 MG tablet Take 200 mg by mouth daily.       Marland Kitchen lactose free nutrition (BOOST PLUS) LIQD Take 237 mLs by mouth 2 (two) times daily between meals.    0  . lansoprazole (PREVACID) 30 MG capsule Take 30 mg by mouth daily at 12 noon.      Marland Kitchen lisinopril (PRINIVIL,ZESTRIL) 10 MG tablet Take 1 tablet (10 mg total) by mouth daily.  30 tablet  11  . metoprolol (LOPRESSOR) 50 MG tablet Take 1 tablet (50 mg total) by mouth 2 (two) times daily.  60 tablet  11  . nystatin (MYCOSTATIN) 100000 UNIT/ML suspension Take 5 mLs (500,000 Units total) by mouth 4 (four) times daily.  60 mL  0  . polyethylene glycol (MIRALAX / GLYCOLAX) packet Take 17 g by mouth at bedtime.       . potassium chloride 20 MEQ/15ML (10%) solution Take 15 mLs (20 mEq total) by mouth daily.  500 mL  0  . sodium chloride (OCEAN) 0.65 % SOLN nasal spray Place 1 spray into the nose as needed for congestion.  30 mL  0  . topiramate (TOPAMAX) 25 MG tablet Take 1 tablet (25 mg total) by  mouth 2 (two) times daily.  30 tablet  3  . HYDROcodone-acetaminophen (NORCO/VICODIN) 5-325 MG per tablet Take 1 tablet by mouth 3 (three) times daily as needed for moderate pain. MAX 3 tablets PER DAY  30 tablet  0   No current facility-administered medications on file prior to visit.    Review of Systems:  As per HPI- otherwise negative.   Physical Examination: Filed Vitals:   07/30/13 1225  BP: 100/60  Pulse: 57  Temp: 98.7 F (37.1 C)  Resp: 20    There were no vitals filed for this visit. There is no weight on file to calculate BMI. Ideal Body Weight:    GEN: WDWN, NAD, Non-toxic, Alert and seems oriented.  However her history is quite non- linear, speaks of miscellaneous events in such a way that I am not able to determine what her symptoms really are today.  Dementia, not delirium.   HEENT: Atraumatic, Normocephalic. Neck supple. No masses, No LAD. Ears and Nose: No external deformity. CV: RRR, No M/G/R. No JVD. No thrill. No extra heart sounds. PULM: CTA B, no wheezes, crackles, rhonchi. No retractions. No resp. distress. No accessory muscle use. ABD: S, NT, ND, +BS. No rebound. No HSM. EXTR: No c/c/e NEURO Normal gait.  PSYCH: Normally interactive. Conversant, but not able to give a useful history or review of systems.    [facility] reading (PRIMARY) by  Dr. Lorelei Pont. Abdominal series: non specific bowel gas pattern, no definite air in bladder.    ABDOMEN - 2 VIEW  COMPARISON: CT scan of the abdomen and pelvis of July 20, 2013  FINDINGS: The bowel gas pattern is nonspecific. A moderate amount of stool is present within the rectosigmoid and diverticula are visible. There are small amounts of small bowel gas in the left mid abdomen. There are surgical clips in the gallbladder fossa.  IMPRESSION: There is no evidence of obstruction or perforation. There is sigmoid diverticulosis.  Se orthostatics in VS.  She did drop her BP upon standing.  Pulse in the 40s/ 50s.  Laying  121/69, 47 Sitting  121/68, 47 Standing 96/63, 53  Results for orders placed in visit on 07/30/13  POCT CBC      Result Value Ref Range   WBC 7.5  4.6 - 10.2 K/uL   Lymph, poc 2.5  0.6 - 3.4   POC LYMPH PERCENT 32.8  10 - 50 %L   MID (cbc) 0.5  0 - 0.9   POC MID % 6.5  0 - 12 %M   POC Granulocyte 4.6  2 - 6.9   Granulocyte percent 60.7  37 - 80 %G   RBC 4.39  4.04 - 5.48 M/uL   Hemoglobin 13.4  12.2 - 16.2 g/dL   HCT, POC 42.1  37.7 - 47.9 %    MCV 95.8  80 - 97 fL   MCH, POC 30.5  27 - 31.2 pg   MCHC 31.8  31.8 - 35.4 g/dL   RDW, POC 13.1     Platelet Count, POC 279  142 - 424 K/uL   MPV 9.2  0 - 99.8 fL  IFOBT (OCCULT BLOOD)      Result Value Ref Range   IFOBT Negative     EKG: bradycardic at 47 BPM, no other acute change noted.  Called and discussed with Dr. Ron Parker (her pcp Dr. Debara Pickett is out of town).  With her bradycardia, lower than usual BP and recent pre- syncope will hold metoprolol for the  time being.   Assessment and Plan: Abdominal pain, other specified site - Plan: POCT CBC, Comprehensive metabolic panel, DG Abd 2 Views  Melena - Plan: IFOBT POC (occult bld, rslt in office)  Pre-syncope - Plan: EKG 12-Lead  Bradycardia  Senile dementia  Khaila is here today to follow-up from recent hospitalization and also to discuss recent presyncopal event.  Found to be bradycardic to the 40s and relatively hypotensive today.  Will hold her beta blocker.  She is to see a geriatrician next week for a recheck Complaint of black stools- did not see any evidence of melena on rectal exam, hemoccult negative and Hg normal.   Check CMP to follow-up low K She is pleasantly demented; not able to give an informative history.  Need to gather information from family and caregivers.  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  No recent cystoscopy as far as her daughter is aware.  They will follow-up with urology asap  Signed Lamar Blinks, MD  Called and spoke with her daughter on 6/18; her K is now a little bit high.  She is taking 20 meq daily. They will hold this, and she is being seen by her new PCP next week.  If any concerns in the meantime they can always call us or come back in.  Otherwise can safely follow-up her BP and K next week

## 2013-07-30 NOTE — Patient Instructions (Addendum)
I will be in touch with the rest of your labs asap.  Please see your urologist- press option 6 and ask for nursing triage.  The nurse I spoke to is named Shirlean Mylar [place], [place], [place] [address]  [phone]  Your heart rate and blood pressure are too low.  Please stop taking the metoprolol (lopressor) medication.  Follow-up with [place] or your new senior care doctor within one week.

## 2013-07-31 LAB — COMPREHENSIVE METABOLIC PANEL
ALBUMIN: 4.5 g/dL (ref 3.5–5.2)
ALT: 23 U/L (ref 0–35)
AST: 23 U/L (ref 0–37)
Alkaline Phosphatase: 83 U/L (ref 39–117)
BILIRUBIN TOTAL: 0.5 mg/dL (ref 0.2–1.2)
BUN: 24 mg/dL — ABNORMAL HIGH (ref 6–23)
CALCIUM: 10.1 mg/dL (ref 8.4–10.5)
CHLORIDE: 100 meq/L (ref 96–112)
CO2: 28 meq/L (ref 19–32)
Creat: 0.82 mg/dL (ref 0.50–1.10)
GLUCOSE: 87 mg/dL (ref 70–99)
POTASSIUM: 5.5 meq/L — AB (ref 3.5–5.3)
SODIUM: 134 meq/L — AB (ref 135–145)
Total Protein: 7.1 g/dL (ref 6.0–8.3)

## 2013-08-01 ENCOUNTER — Encounter: Payer: Self-pay | Admitting: Family Medicine

## 2013-08-04 ENCOUNTER — Ambulatory Visit: Payer: Medicare Other | Admitting: Family Medicine

## 2013-08-06 ENCOUNTER — Encounter: Payer: Self-pay | Admitting: *Deleted

## 2013-08-07 ENCOUNTER — Encounter: Payer: Self-pay | Admitting: Internal Medicine

## 2013-08-07 ENCOUNTER — Encounter: Payer: Self-pay | Admitting: *Deleted

## 2013-08-07 ENCOUNTER — Ambulatory Visit (INDEPENDENT_AMBULATORY_CARE_PROVIDER_SITE_OTHER): Payer: Medicare Other | Admitting: Internal Medicine

## 2013-08-07 VITALS — BP 144/72 | HR 62 | Temp 97.4°F | Ht 64.0 in | Wt 109.0 lb

## 2013-08-07 DIAGNOSIS — M3500 Sicca syndrome, unspecified: Secondary | ICD-10-CM

## 2013-08-07 DIAGNOSIS — F03918 Unspecified dementia, unspecified severity, with other behavioral disturbance: Secondary | ICD-10-CM

## 2013-08-07 DIAGNOSIS — M5430 Sciatica, unspecified side: Secondary | ICD-10-CM

## 2013-08-07 DIAGNOSIS — M797 Fibromyalgia: Secondary | ICD-10-CM

## 2013-08-07 DIAGNOSIS — T40605A Adverse effect of unspecified narcotics, initial encounter: Secondary | ICD-10-CM

## 2013-08-07 DIAGNOSIS — E876 Hypokalemia: Secondary | ICD-10-CM

## 2013-08-07 DIAGNOSIS — M8580 Other specified disorders of bone density and structure, unspecified site: Secondary | ICD-10-CM

## 2013-08-07 DIAGNOSIS — K589 Irritable bowel syndrome without diarrhea: Secondary | ICD-10-CM

## 2013-08-07 DIAGNOSIS — G609 Hereditary and idiopathic neuropathy, unspecified: Secondary | ICD-10-CM

## 2013-08-07 DIAGNOSIS — F0391 Unspecified dementia with behavioral disturbance: Secondary | ICD-10-CM

## 2013-08-07 DIAGNOSIS — M949 Disorder of cartilage, unspecified: Secondary | ICD-10-CM

## 2013-08-07 DIAGNOSIS — K5909 Other constipation: Secondary | ICD-10-CM

## 2013-08-07 DIAGNOSIS — M5440 Lumbago with sciatica, unspecified side: Secondary | ICD-10-CM

## 2013-08-07 DIAGNOSIS — T402X5A Adverse effect of other opioids, initial encounter: Secondary | ICD-10-CM

## 2013-08-07 DIAGNOSIS — IMO0001 Reserved for inherently not codable concepts without codable children: Secondary | ICD-10-CM

## 2013-08-07 DIAGNOSIS — M899 Disorder of bone, unspecified: Secondary | ICD-10-CM

## 2013-08-07 DIAGNOSIS — F411 Generalized anxiety disorder: Secondary | ICD-10-CM

## 2013-08-07 DIAGNOSIS — K5903 Drug induced constipation: Secondary | ICD-10-CM

## 2013-08-07 DIAGNOSIS — N3946 Mixed incontinence: Secondary | ICD-10-CM

## 2013-08-07 NOTE — Progress Notes (Signed)
Patient ID: Ariana [name], female   DOB: 04/17/1929, 80 y.o.   MRN: [id number]   Location:  [facility]  Code Status: has hcpoa  Allergies  Allergen Reactions  . Codeine Nausea Only    unless given with Phenergan  . Doxycycline     Unknown  . Klonopin [Clonazepam]     Causes hallucination   . Meperidine Hcl Nausea Only    unless given with Phenergan  . Naproxen   . Norflex [Orphenadrine Citrate] Nausea Only    Unless given with Phenergan  . Oxycodone-Acetaminophen Nausea Only    unless given with phenergan  . Penicillins     Unknown  . Phenothiazines     Unknown  . Propoxyphene Hcl Nausea Only    unless given with phenergan  . Stelazine     Unknown  . Sulfamethoxazole-Trimethoprim     Unknown  . Tolectin [Tolmetin Sodium]     Unknown  . Tramadol     Unknown  . Zoloft [Sertraline Hcl]     Caused pt to sleep a lot  . Lubiprostone Rash    Chief Complaint  Patient presents with  . Medical Management of Chronic Issues    New Patient. Was  going to Dr. Lenna [name], he isn't doing primary anymore, needs new primary.  [facility] Hospitalization Follow-up    2 weeks ago low potassium . Here with daughter Ariana [name]  . medications    alot of changes    HPI: Patient is a 80 y.o. white female seen in the office today to establish with the practice.  She is accompanied by her daughter, Ariana [name].    Had seen Dr. Lenna [name] for 15 yrs, now only doing pulmonary so transferred here.    Feels really bad.  Has neuropathy of her right leg.  Feels stiff.  Left leg gives way on her.  Dr. Jannifer [name] did NCS on right leg.  Has had several falls.  Was on 1200mg  of neurontin which was discontinued.  Did a lot better off the neurontin.  Was shopping, planting flowers.  Then when hospitalized this last time, she seems tired, can hardly walk.  Her daughter can't understand how just the low K and QT prolongation could cause.  Has had low potassium on 2 other occasions (2005).       Has had so many med changes in the last month.   Confusion about whether to stop lisinopril or metoprolol.  Dr. Debara [name] had just put her on the lisinopril 20mg --then reduced to 10mg  and increased metoprolol to 50mg  po bid.     Pilocarpine and hydroxychloroquine for sjogren's--Dr. Deveschwar.  Also sees eye doctor due to plaquenil--Dr. Monna [name].  Has plugs in eyes.  Still has pain in the right eye since plug put in the last time.  Right eye burns and stings.    Topamax is for migraines.  Was to stop hydrocodone and use topamax.  Went to hospital near that time.  Was put back on hydrocodone tid and topamax at the hospital.  Takes a xanax in the morning and one at bedtime to go to sleep.  Cymbalta was at 90 mg and then down to 60mg  then 30mg .  Has affected her mood significantly.    Does not have energy to go out and do things.    Her daughter puts her medicines in her pillbox.  Pt is c/o this. Sounds like she has some paranoia about her medications and that they are  incorrect.     Migraines persist despite topamax--sometimes better, sometimes not.    Her geriatric psychiatrist is Dr. Casimiro [name].    For cystoscope w/ Dr. Diona [name] upcoming  Review of Systems:  Review of Systems  Constitutional: Positive for malaise/fatigue.       Poor appetite  HENT: Negative for tinnitus.        Loss of smell, has dentures, dry mouth, gum pain  Eyes: Positive for blurred vision, pain, discharge and redness.       Dry eyes  Respiratory: Positive for shortness of breath. Negative for cough.   Cardiovascular: Negative for chest pain and palpitations.       Murmur, hypertension  Gastrointestinal: Positive for abdominal pain, diarrhea, constipation and melena.       Hiatal hernia, dypshagia, hemorrhoids, flatulence, pain with stools  Genitourinary: Positive for dysuria, urgency and frequency.       Recurrent infections, incontinence  Musculoskeletal: Positive for back pain, joint pain and falls.        Restricted ROM, deconditioning, joint stiffness  Skin: Positive for itching.       Dry skin  Neurological: Positive for dizziness, weakness and headaches.  Endo/Heme/Allergies: Positive for environmental allergies. Bruises/bleeds easily.  Psychiatric/Behavioral: Positive for depression. The patient is nervous/anxious.        Mood swings, agitation    Past Medical History  Diagnosis Date  . Sjogren's syndrome   . Dry eye syndrome   . Hypertension, benign   . Mitral valve prolapse   . GERD (gastroesophageal reflux disease)   . Diverticulosis of colon   . Irritable bowel syndrome   . Urinary incontinence   . Low back pain syndrome   . Fibromyalgia   . Memory loss   . Anxiety and depression   . History of adverse drug reaction   . Peripheral neuropathy     "both feet and legs"  . Shortness of breath 07/18/11    "alot lately"  . Anginal pain   . History of recurrent UTIs   . H/O hiatal hernia   . Anxiety   . Dementia   . Depression   . Abnormality of gait   . Thyroid nodule   . Headache(784.0) 09/05/2012  . Adenomatous polyp of colon 2002    68mm    Past Surgical History  Procedure Laterality Date  . Vesicovaginal fistula closure w/ tah    . Appendectomy    . Cholecystectomy  2000  . Mandible surgery    . Temporomandibular joint surgery  1986    Dr. Terence [name]  . Cataract extraction, bilateral    . Abdominal hysterectomy  1967  . Dental surgery      multiple tooth extractions  . Esophagogastroduodenoscopy (egd) with esophageal dilation N/A 08/23/2012    Procedure: ESOPHAGOGASTRODUODENOSCOPY (EGD) WITH ESOPHAGEAL DILATION;  Surgeon: Ariana Banister, MD;  Location: [facility];  Service: Endoscopy;  Laterality: N/A;  . Colonoscopy w/ biopsies      multiple  . Nasal septum surgery  1980  . Transthoracic echocardiogram  2001    mild LVH, normal LV  . Nm myocar perf wall motion  2003    persantine - normal static and dynamic study w/apical thinning and presvered LV  function, no ischemia  . Cardiac catheterization  02/17/2003    normal L main, LAD free of disease, Cfx free of disease, RCA free of disease (Dr. Rockne [name])    Social History:   reports that she has quit smoking. She has never  used smokeless tobacco. She reports that she does not drink alcohol or use illicit drugs.  Family History  Problem Relation Age of Onset  . Heart disease Father     heart attack  . Pneumonia Mother   . Heart attack Mother   . Hypertension Mother   . Hypertension Maternal Grandmother   . Colon cancer Sister   . Kidney disease Daughter   . Asthma Daughter   . Arthritis Daughter 26    osteo,  . Heart disease Son 12    stage 3 CHF(Diastolic /Systolic)  . Throat cancer Brother     Medications: Patient's Medications  New Prescriptions   No medications on file  Previous Medications   ALPRAZOLAM (XANAX) 0.5 MG TABLET    Take 0.5-1 mg by mouth. Takes 0.5mg  in am, 0.5mg  at 2pm, and 1mg  at bedtime.   ASPIRIN EC 81 MG TABLET    Take 81 mg by mouth daily.   CALCIUM CARBONATE PO    Take 1 tablet by mouth daily.   CARBOXYMETHYLCELLULOSE (REFRESH TEARS) 0.5 % SOLN    1 drop 2 (two) times daily as needed (dry eyes).    CRANBERRY SOFT PO    Take 1 capsule by mouth daily.   DILTIAZEM (TIAZAC) 120 MG 24 HR CAPSULE    Take 120 mg by mouth daily.   DULOXETINE (CYMBALTA) 30 MG CAPSULE    Take 1 capsule (30 mg total) by mouth daily.   HYDROCODONE-ACETAMINOPHEN (NORCO/VICODIN) 5-325 MG PER TABLET    Take 1 tablet by mouth 3 (three) times daily as needed for moderate pain. MAX 3 tablets PER DAY   HYDROXYCHLOROQUINE (PLAQUENIL) 200 MG TABLET    Take 200 mg by mouth daily.    LACTOSE FREE NUTRITION (BOOST PLUS) LIQD    Take 237 mLs by mouth 2 (two) times daily between meals.   LANSOPRAZOLE (PREVACID) 30 MG CAPSULE    Take 30 mg by mouth daily at 12 noon.   LISINOPRIL (PRINIVIL,ZESTRIL) 10 MG TABLET    Take 1 tablet (10 mg total) by mouth daily.   METOPROLOL (LOPRESSOR) 50 MG  TABLET    Take 1 tablet (50 mg total) by mouth 2 (two) times daily.   NYSTATIN (MYCOSTATIN) 100000 UNIT/ML SUSPENSION    Take 5 mLs (500,000 Units total) by mouth 4 (four) times daily.   POLYETHYLENE GLYCOL (MIRALAX / GLYCOLAX) PACKET    Take 17 g by mouth at bedtime.    POTASSIUM CHLORIDE 20 MEQ/15ML (10%) SOLUTION    Take 15 mLs (20 mEq total) by mouth daily.   SODIUM CHLORIDE (OCEAN) 0.65 % SOLN NASAL SPRAY    Place 1 spray into the nose as needed for congestion.   TOPIRAMATE (TOPAMAX) 25 MG TABLET    Take 1 tablet (25 mg total) by mouth 2 (two) times daily.  Modified Medications   No medications on file  Discontinued Medications   No medications on file     Physical Exam: Filed Vitals:   08/07/13 0930  BP: 144/72  Pulse: 62  Temp: 97.4 F (36.3 C)  TempSrc: Oral  Height: 5\' 4"  (1.626 m)  Weight: 109 lb (49.442 kg)  SpO2: 97%  Physical Exam  Constitutional:  Frail white female, nad  HENT:  Head: Normocephalic and atraumatic.  Right Ear: External ear normal.  Left Ear: External ear normal.  Nose: Nose normal.  Mouth/Throat: Oropharynx is clear and moist. No oropharyngeal exudate.  Eyes: Conjunctivae and EOM are normal. Pupils are equal, round, and  reactive to light.  Neck: Normal range of motion. Neck supple. No JVD present. No tracheal deviation present. No thyromegaly present.  Cardiovascular: Normal rate, regular rhythm and intact distal pulses.   Murmur heard. Pulmonary/Chest: Effort normal and breath sounds normal. No respiratory distress. She has no wheezes.  Abdominal: Soft. Bowel sounds are normal. She exhibits no distension and no mass. There is no tenderness. No hernia.  Musculoskeletal: Normal range of motion. She exhibits tenderness. She exhibits no edema.  Of lumbar paravertebral areas, sacroiliac region  Lymphadenopathy:    She has no cervical adenopathy.  Neurological: She is alert. No cranial nerve deficit.  Oriented to person and place, not precise time    Skin: Skin is warm and dry. There is pallor.  Psychiatric:  Labile mood even just within the visit, very angry about many things she talks about, gets talking and cannot be interrupted    Labs reviewed: Basic Metabolic Panel:  Recent Labs  08/21/12 0526 08/21/12 1035  07/20/13 2017 07/21/13 0519 07/21/13 1843 07/22/13 0517 07/30/13 1400  NA 142  --   < >  --  144  --  141 134*  K 3.1*  --   < >  --  2.8* 3.3* 3.6* 5.5*  CL 100  --   < >  --  105  --  106 100  CO2 34*  --   < >  --  28  --  25 28  GLUCOSE 114*  --   < >  --  94  --  104* 87  BUN 6  --   < >  --  8  --  12 24*  CREATININE 0.54  --   < >  --  0.61  --  0.64 0.82  CALCIUM 9.0  --   < >  --  9.3  --  9.4 10.1  MG  --  1.8  --  1.9  --   --   --   --   TSH 1.105  --   --   --   --   --   --   --   < > = values in this interval not displayed. Liver Function Tests:  Recent Labs  05/03/13 1119 07/20/13 1630 07/30/13 1400  AST 22 46* 23  ALT 16 57* 23  ALKPHOS 62 96 83  BILITOT 0.6 0.7 0.5  PROT 6.6 7.2 7.1  ALBUMIN 4.1 4.1 4.5    Recent Labs  08/21/12 0526 07/20/13 1630  LIPASE 31 41   No results found for this basename: AMMONIA,  in the last 8760 hours CBC:  Recent Labs  08/21/12 0526  05/06/13 1845  07/20/13 1630 07/22/13 0517 07/30/13 1402  WBC 7.1  < > 7.2  < > 6.5 6.8 7.5  NEUTROABS 3.4  --  3.6  --  3.7  --   --   HGB 12.6  < > 13.0  < > 12.9 13.0 13.4  HCT 37.8  < > 38.1  < > 37.7 38.2 42.1  MCV 91.5  < > 90.9  < > 90.8 89.5 95.8  PLT 187  --  264  --  192 213  --   < > = values in this interval not displayed.  Lab Results  Component Value Date   HGBA1C 5.7 05/16/2012   Assessment/Plan  1. Irritable bowel syndrome -will try to get the constipation portion of this under control b/c it seems to be predominant--advised to stick  with a regimen and not stop it after two days--try senna s two tabs daily and then take miralax if no bm by day 1 -if this is ineffective, will consider  linzess or movantik  2. Therapeutic opioid induced constipation -consider movantik or linzess if senna and miralax are not a success -she is very inpatient waiting for something to work and changes before it gets a chance  3. Senile dementia, with behavioral disturbance -overarching problem here -she does not accept that she has any cognitive losses -seems she may also be bipolar or suffering from depression with paranoid psychosis on top of her dementia -her daughter is trying to manage her finances and help out with history medically, but pt gets very upset and angry -will plan to assess cognition and try to start her on namenda if possible next time  4. Hereditary and idiopathic peripheral neuropathy -begin cymbalta for this and her fibromyalgia plus her mood to see if we can hit two birds with one stone -? topamax truly doing any good--sometimes causes this-would favor tapering off  5. SJOGREN'S SYNDROME -wants to switch ophthalmologists so referred to [facility], cont referesh drops, plaquenil per Dr. Kathee Delton  6. Fibromyalgia -encouraged exercise and socialization to help with this, but resistant to all suggestions -try cymbalta for mood and pain  7. Osteopenia -needs to be on at least vitamin D3 2000 units daily  -continues calcium but unclear if it has any vitamin D in it -will need to see if she's had a recent bone density  8. Mixed stress and urge urinary incontinence -keep f/u with urology -would suggest myrbetriq if any meds, depends  9. Midline low back pain with sciatica, sciatica laterality unspecified -longstanding problem for her, has not done well without hydrocodone so will cont, add cymbalta  10. Hypokalemia -f/u bmp next visit, cont ace which should help keep this up  11. Generalized anxiety disorder -will attempt to taper off xanax once she's on optimal dose of cymbalta -this is not helping her cognitive state -refuses to go back to Dr.  Martina Sinner  Labs/tests ordered:No orders of the defined types were placed in this encounter.    Next appt:  72month

## 2013-08-11 NOTE — Telephone Encounter (Signed)
Called to check on her, and to let her daughter know that we want to have her see Dr. Debara Pickett in a month or two.  She will follow-up on this.

## 2013-08-11 NOTE — Telephone Encounter (Signed)
Message copied by Darreld Mclean on Mon Aug 11, 2013  5:24 PM ------      Message from: Pixie Casino      Created: Sun Aug 03, 2013  3:29 PM       Thank you .Marland Kitchen i wonder if she is taking her medication as directed? Or if her dementia is playing a role in this. I'm happy to see her back in a month or two. Her main cardiac complaints recently have been about palpitations.            -Mali            ----- Message -----         From: Darreld Mclean, MD         Sent: 07/30/2013   9:42 PM           To: Pixie Casino, MD            I saw Ariana [name] today for hospital follow-up.  This was my first time meeting her, but it seems her dementia is getting worse from what was described in the past.  She is pleasant but not able to stay on a linear thought pattern or give useful history at this point.       She had pre-syncope recently and was bradycardic to the 40s/ 50s when I saw her.  Spoke with Dr. Ron Parker and we decided to hold her metoprolol for the time being.  I can ask her to follow-up with you whenever you would like.            Warm regards      JC       ------

## 2013-08-14 ENCOUNTER — Telehealth: Payer: Self-pay | Admitting: *Deleted

## 2013-08-14 NOTE — Telephone Encounter (Signed)
Dr. Debara Pickett approves restarting pilocarpine - faxed to Dr. Estanislado Pandy

## 2013-08-22 ENCOUNTER — Other Ambulatory Visit: Payer: Self-pay | Admitting: *Deleted

## 2013-08-22 MED ORDER — LANSOPRAZOLE 30 MG PO CPDR: 30.0000 mg | DELAYED_RELEASE_CAPSULE | Freq: Every day | ORAL | Status: DC

## 2013-08-22 NOTE — Telephone Encounter (Signed)
Patient Requested 

## 2013-08-29 ENCOUNTER — Other Ambulatory Visit: Payer: Self-pay | Admitting: *Deleted

## 2013-08-29 MED ORDER — HYDROCODONE-ACETAMINOPHEN 5-325 MG PO TABS: 1.0000 | ORAL_TABLET | Freq: Three times a day (TID) | ORAL | Status: DC | PRN

## 2013-09-20 ENCOUNTER — Other Ambulatory Visit (HOSPITAL_COMMUNITY): Payer: Self-pay | Admitting: Pulmonary Disease

## 2013-09-22 ENCOUNTER — Ambulatory Visit (INDEPENDENT_AMBULATORY_CARE_PROVIDER_SITE_OTHER): Payer: Medicare Other | Admitting: Internal Medicine

## 2013-09-22 ENCOUNTER — Encounter: Payer: Self-pay | Admitting: Internal Medicine

## 2013-09-22 VITALS — BP 144/80 | HR 68 | Temp 97.5°F | Resp 10 | Ht 64.0 in | Wt 111.2 lb

## 2013-09-22 DIAGNOSIS — R9431 Abnormal electrocardiogram [ECG] [EKG]: Secondary | ICD-10-CM

## 2013-09-22 DIAGNOSIS — F0391 Unspecified dementia with behavioral disturbance: Secondary | ICD-10-CM

## 2013-09-22 DIAGNOSIS — F03918 Unspecified dementia, unspecified severity, with other behavioral disturbance: Secondary | ICD-10-CM

## 2013-09-22 DIAGNOSIS — M545 Low back pain, unspecified: Secondary | ICD-10-CM

## 2013-09-22 DIAGNOSIS — K117 Disturbances of salivary secretion: Secondary | ICD-10-CM

## 2013-09-22 DIAGNOSIS — F341 Dysthymic disorder: Secondary | ICD-10-CM

## 2013-09-22 DIAGNOSIS — I10 Essential (primary) hypertension: Secondary | ICD-10-CM

## 2013-09-22 DIAGNOSIS — M3500 Sicca syndrome, unspecified: Secondary | ICD-10-CM

## 2013-09-22 DIAGNOSIS — K589 Irritable bowel syndrome without diarrhea: Secondary | ICD-10-CM

## 2013-09-22 DIAGNOSIS — R682 Dry mouth, unspecified: Secondary | ICD-10-CM

## 2013-09-22 DIAGNOSIS — IMO0001 Reserved for inherently not codable concepts without codable children: Secondary | ICD-10-CM

## 2013-09-22 MED ORDER — ALPRAZOLAM 0.5 MG PO TABS: 0.5000 mg | ORAL_TABLET | Freq: Two times a day (BID) | ORAL | Status: DC | PRN

## 2013-09-22 MED ORDER — HYDROCODONE-ACETAMINOPHEN 5-325 MG PO TABS: 1.0000 | ORAL_TABLET | Freq: Three times a day (TID) | ORAL | Status: DC | PRN

## 2013-09-22 MED ORDER — MEMANTINE HCL ER 7 & 14 & 21 &28 MG PO CP24: 1.0000 | ORAL_CAPSULE | Freq: Every day | ORAL | Status: DC

## 2013-09-22 MED ORDER — NYSTATIN 100000 UNIT/ML MT SUSP: 5.0000 mL | Freq: Four times a day (QID) | OROMUCOSAL | Status: DC

## 2013-09-22 MED ORDER — DULOXETINE HCL 60 MG PO CPEP: 60.0000 mg | ORAL_CAPSULE | Freq: Every day | ORAL | Status: DC

## 2013-09-22 NOTE — Progress Notes (Signed)
Patient ID: Ariana [name], female   DOB: [birth year], 80 y.o.   MRN: [id number]   Location:  [facility] / [facility]  Code Status: DNR, daughter is hcpoa  Allergies  Allergen Reactions  . Codeine Nausea Only    unless given with Phenergan  . Doxycycline     Unknown  . Klonopin [Clonazepam]     Causes hallucination   . Meperidine Hcl Nausea Only    unless given with Phenergan  . Naproxen   . Norflex [Orphenadrine Citrate] Nausea Only    Unless given with Phenergan  . Oxycodone-Acetaminophen Nausea Only    unless given with phenergan  . Penicillins     Unknown  . Phenothiazines     Unknown  . Propoxyphene Hcl Nausea Only    unless given with phenergan  . Stelazine     Unknown  . Sulfamethoxazole-Trimethoprim     Unknown  . Tolectin [Tolmetin Sodium]     Unknown  . Tramadol     Unknown  . Zoloft [Sertraline Hcl]     Caused pt to sleep a lot  . Lubiprostone Rash    Chief Complaint  Patient presents with  . Medical Management of Chronic Issues    1 month follow-up     HPI: Patient is a 80 y.o. white female seen in the office today for one month f/u.    C/o dry eyes and dry mouth. Tells me the whole story she told me last time about her eyes.  Has Sjogren's.  Eyes are remaining blurry.  Has had plug in left eye and injections done.  Says she des not have a f/u with ophthalmology.  Uses biotene.  Uses nystatin mouthwash also.  On plaquenil.  Says mood is sometimes good, sometimes bad.  Her daughter and caregiver both look at their laps.  Says she has new neighbors.  Has helpful neighbors.    Does not want to go back to Dr. Casimiro Needle.  Mood remains clearly poor.  Admits she hurts a lot.  Doesn't like to be by herself.  Starts picking on her daughter again.  She's been on different doses of cymbalta, but 30mg  since early June.  Did not help until 90mg .  Has had crying spells lately.  Has been asking for her mother a lot lately.  Taking xanax  three times per day.  Claims her son in law goes through her money and takes it.  Is having delusions.    Her daughter got a weekend caregiver also.  Potassium did normalize at Dr. [name] office.    Namenda was stopped about 6 mos ago when couldn't get a hold of it.    Was refusing therapy so he stopped coming.    Review of Systems:  Review of Systems  Constitutional: Positive for weight loss and malaise/fatigue.  HENT: Positive for hearing loss. Negative for congestion.        Dry mouth  Eyes: Positive for blurred vision, pain and redness.       Dry eyes  Respiratory: Negative for shortness of breath.   Cardiovascular: Negative for chest pain and leg swelling.  Gastrointestinal: Positive for abdominal pain, diarrhea and constipation.  Genitourinary: Positive for urgency and frequency. Negative for dysuria.  Musculoskeletal: Positive for myalgias and back pain. Negative for falls.  Skin: Negative for rash.  Neurological: Positive for weakness. Negative for dizziness and loss of consciousness.  Psychiatric/Behavioral: Positive for memory loss.     Past Medical History  Diagnosis Date  . Sjogren's syndrome   . Dry eye syndrome   . Hypertension, benign   . Mitral valve prolapse   . GERD (gastroesophageal reflux disease)   . Diverticulosis of colon   . Irritable bowel syndrome   . Urinary incontinence   . Low back pain syndrome   . Fibromyalgia   . Memory loss   . Anxiety and depression   . History of adverse drug reaction   . Peripheral neuropathy     "both feet and legs"  . Shortness of breath 07/18/11    "alot lately"  . Anginal pain   . History of recurrent UTIs   . H/O hiatal hernia   . Anxiety   . Dementia   . Depression   . Abnormality of gait   . Thyroid nodule   . Headache(784.0) 09/05/2012  . Adenomatous polyp of colon 2002    103mm    Past Surgical History  Procedure Laterality Date  . Vesicovaginal fistula closure w/ tah    . Appendectomy    .  Cholecystectomy  2000  . Mandible surgery    . Temporomandibular joint surgery  1986    Dr. Terence Lux  . Cataract extraction, bilateral    . Abdominal hysterectomy  1967  . Dental surgery      multiple tooth extractions  . Esophagogastroduodenoscopy (egd) with esophageal dilation N/A 08/23/2012    Procedure: ESOPHAGOGASTRODUODENOSCOPY (EGD) WITH ESOPHAGEAL DILATION;  Surgeon: Milus Banister, MD;  Location: [facility];  Service: Endoscopy;  Laterality: N/A;  . Colonoscopy w/ biopsies      multiple  . Nasal septum surgery  1980  . Transthoracic echocardiogram  2001    mild LVH, normal LV  . Nm myocar perf wall motion  2003    persantine - normal static and dynamic study w/apical thinning and presvered LV function, no ischemia  . Cardiac catheterization  02/17/2003    normal L main, LAD free of disease, Cfx free of disease, RCA free of disease (Dr. Rockne Menghini)    Social History:   reports that she has quit smoking. She has never used smokeless tobacco. She reports that she does not drink alcohol or use illicit drugs.  Family History  Problem Relation Age of Onset  . Heart disease Father     heart attack  . Pneumonia Mother   . Heart attack Mother   . Hypertension Mother   . Hypertension Maternal Grandmother   . Colon cancer Sister   . Kidney disease Daughter   . Asthma Daughter   . Arthritis Daughter 70    osteo,  . Heart disease Son 96    stage 3 CHF(Diastolic /Systolic)  . Throat cancer Brother     Medications: Patient's Medications  New Prescriptions   No medications on file  Previous Medications   ALPRAZOLAM (XANAX) 0.5 MG TABLET    Take 0.5-1 mg by mouth. Takes 0.5mg  in am, 0.5mg  at 2pm, and 1mg  at bedtime.   ASPIRIN EC 81 MG TABLET    Take 81 mg by mouth daily.   CALCIUM CARBONATE PO    Take 1 tablet by mouth daily.   CARBOXYMETHYLCELLULOSE (REFRESH TEARS) 0.5 % SOLN    1 drop 2 (two) times daily as needed (dry eyes).    CRANBERRY SOFT PO    Take 1 capsule by  mouth daily.   DILTIAZEM (TIAZAC) 120 MG 24 HR CAPSULE    Take 120 mg by mouth daily.   DULOXETINE (CYMBALTA)  30 MG CAPSULE    Take 1 capsule (30 mg total) by mouth daily.   HYDROCODONE-ACETAMINOPHEN (NORCO/VICODIN) 5-325 MG PER TABLET    Take 1 tablet by mouth 3 (three) times daily as needed for moderate pain. MAX 3 tablets PER DAY   HYDROXYCHLOROQUINE (PLAQUENIL) 200 MG TABLET    Take 200 mg by mouth daily.    LACTOSE FREE NUTRITION (BOOST PLUS) LIQD    Take 237 mLs by mouth 2 (two) times daily between meals.   LANSOPRAZOLE (PREVACID) 30 MG CAPSULE    Take 1 capsule (30 mg total) by mouth daily at 12 noon.   LISINOPRIL (PRINIVIL,ZESTRIL) 10 MG TABLET    Take 1 tablet (10 mg total) by mouth daily.   METOPROLOL (LOPRESSOR) 50 MG TABLET    Take 1 tablet (50 mg total) by mouth 2 (two) times daily.   NYSTATIN (MYCOSTATIN) 100000 UNIT/ML SUSPENSION    Take 5 mLs (500,000 Units total) by mouth 4 (four) times daily.   POLYETHYLENE GLYCOL (MIRALAX / GLYCOLAX) PACKET    Take 17 g by mouth at bedtime.    SODIUM CHLORIDE (OCEAN) 0.65 % SOLN NASAL SPRAY    Place 1 spray into the nose as needed for congestion.   TOPIRAMATE (TOPAMAX) 25 MG TABLET    Take 1 tablet (25 mg total) by mouth 2 (two) times daily.  Modified Medications   No medications on file  Discontinued Medications   POTASSIUM CHLORIDE 20 MEQ/15ML (10%) SOLUTION    Take 15 mLs (20 mEq total) by mouth daily.     Physical Exam: Filed Vitals:   09/22/13 1007  BP: 144/80  Pulse: 68  Temp: 97.5 F (36.4 C)  TempSrc: Oral  Resp: 10  Height: 5\' 4"  (1.626 m)  Weight: 111 lb 3.2 oz (50.44 kg)  SpO2: 96%  Physical Exam  Constitutional: She is oriented to person, place, and time. No distress.  Tall thin white female  Cardiovascular: Normal rate, regular rhythm, normal heart sounds and intact distal pulses.   Pulmonary/Chest: Effort normal and breath sounds normal. No respiratory distress.  Abdominal: Soft. Bowel sounds are normal. She  exhibits no distension and no mass. There is no tenderness.  Musculoskeletal: Normal range of motion. She exhibits tenderness.  Of paravertebral muscles of thoracolumbar region  Neurological: She is alert and oriented to person, place, and time.  Psychiatric:  Flat affect    Labs reviewed: Basic Metabolic Panel:  Recent Labs  07/20/13 2017 07/21/13 0519 07/21/13 1843 07/22/13 0517 07/30/13 1400  NA  --  144  --  141 134*  K  --  2.8* 3.3* 3.6* 5.5*  CL  --  105  --  106 100  CO2  --  28  --  25 28  GLUCOSE  --  94  --  104* 87  BUN  --  8  --  12 24*  CREATININE  --  0.61  --  0.64 0.82  CALCIUM  --  9.3  --  9.4 10.1  MG 1.9  --   --   --   --    Liver Function Tests:  Recent Labs  05/03/13 1119 07/20/13 1630 07/30/13 1400  AST 22 46* 23  ALT 16 57* 23  ALKPHOS 62 96 83  BILITOT 0.6 0.7 0.5  PROT 6.6 7.2 7.1  ALBUMIN 4.1 4.1 4.5    Recent Labs  07/20/13 1630  LIPASE 41   No results found for this basename: AMMONIA,  in the  last 8760 hours CBC:  Recent Labs  05/06/13 1845  07/20/13 1630 07/22/13 0517 07/30/13 1402  WBC 7.2  < > 6.5 6.8 7.5  NEUTROABS 3.6  --  3.7  --   --   HGB 13.0  < > 12.9 13.0 13.4  HCT 38.1  < > 37.7 38.2 42.1  MCV 90.9  < > 90.8 89.5 95.8  PLT 264  --  192 213  --   < > = values in this interval not displayed.  Lab Results  Component Value Date   HGBA1C 5.7 05/16/2012   Assessment/Plan 1. Dry mouth - appears she has some thrush - nystatin (MYCOSTATIN) 100000 UNIT/ML suspension; Take 5 mLs (500,000 Units total) by mouth 4 (four) times daily.  Dispense: 60 mL; Refill: 5  2. Senile dementia, with behavioral disturbance Start namenda XR titration pack, then continue with 28mg  daily.    3. FIBROMYALGIA Stop topamax Increase cymbalta to 60mg .  Will increase to 90mg  if she tolerates this increase.   Decrease xanax to 0.5mg  by mouth twice a day as needed (do not stop altogether because she will redraw) - DULoxetine  (CYMBALTA) 60 MG capsule; Take 1 capsule (60 mg total) by mouth daily.  Dispense: 30 capsule; Refill: 3  4. SJOGREN'S SYNDROME -f/u with rheum and ophtho  5. ANXIETY DEPRESSION -try to taper off xanax slowly as above - DULoxetine (CYMBALTA) 60 MG capsule; Take 1 capsule (60 mg total) by mouth daily.  Dispense: 30 capsule; Refill: 3  6. Prolonged QT interval Monitor and avoid potential prolonging meds  7. HYPERTENSION, BENIGN bp at goal  8. Irritable bowel syndrome -constipation predominant -will address more next time  9. Low back pain associated with a spinal disorder other than radiculopathy or spinal stenosis -cont pain regimen except topamax and increase cymbalta  Labs/tests ordered:No orders of the defined types were placed in this encounter.    Next appt:  6 wks re: mood, pain, memory; Next time, will try to adjust pain medications to decrease constipation and help with nerve pains.

## 2013-09-22 NOTE — Patient Instructions (Addendum)
Stop topamax Start namenda XR titration pack, then continue with 28mg  daily.   Increase cymbalta to 60mg .  Will increase to 90mg  if she tolerates this increase.   Decrease xanax to 0.5mg  by mouth twice a day as needed (do not stop altogether because she will redraw)  Next time, will try to adjust pain medications to decrease constipation and help with nerve pains.

## 2013-09-22 NOTE — Progress Notes (Signed)
Failed clock drawing  

## 2013-10-27 ENCOUNTER — Other Ambulatory Visit: Payer: Self-pay | Admitting: *Deleted

## 2013-10-27 DIAGNOSIS — M545 Low back pain, unspecified: Secondary | ICD-10-CM

## 2013-10-27 DIAGNOSIS — IMO0001 Reserved for inherently not codable concepts without codable children: Secondary | ICD-10-CM

## 2013-10-27 MED ORDER — HYDROCODONE-ACETAMINOPHEN 5-325 MG PO TABS: 1.0000 | ORAL_TABLET | Freq: Three times a day (TID) | ORAL | Status: DC | PRN

## 2013-10-27 NOTE — Telephone Encounter (Signed)
Patient Requested and will pick up 

## 2013-11-03 ENCOUNTER — Ambulatory Visit (INDEPENDENT_AMBULATORY_CARE_PROVIDER_SITE_OTHER): Payer: Medicare Other | Admitting: Internal Medicine

## 2013-11-03 ENCOUNTER — Encounter: Payer: Self-pay | Admitting: Internal Medicine

## 2013-11-03 VITALS — BP 132/78 | HR 65 | Temp 98.1°F | Resp 10 | Ht 64.0 in | Wt 117.0 lb

## 2013-11-03 DIAGNOSIS — F341 Dysthymic disorder: Secondary | ICD-10-CM

## 2013-11-03 DIAGNOSIS — T40605A Adverse effect of unspecified narcotics, initial encounter: Secondary | ICD-10-CM

## 2013-11-03 DIAGNOSIS — T402X5A Adverse effect of other opioids, initial encounter: Secondary | ICD-10-CM | POA: Insufficient documentation

## 2013-11-03 DIAGNOSIS — K5903 Drug induced constipation: Secondary | ICD-10-CM | POA: Insufficient documentation

## 2013-11-03 DIAGNOSIS — Z23 Encounter for immunization: Secondary | ICD-10-CM

## 2013-11-03 DIAGNOSIS — F0391 Unspecified dementia with behavioral disturbance: Secondary | ICD-10-CM

## 2013-11-03 DIAGNOSIS — N3946 Mixed incontinence: Secondary | ICD-10-CM

## 2013-11-03 DIAGNOSIS — G629 Polyneuropathy, unspecified: Secondary | ICD-10-CM

## 2013-11-03 DIAGNOSIS — K649 Unspecified hemorrhoids: Secondary | ICD-10-CM

## 2013-11-03 DIAGNOSIS — M545 Low back pain, unspecified: Secondary | ICD-10-CM

## 2013-11-03 DIAGNOSIS — K5909 Other constipation: Secondary | ICD-10-CM

## 2013-11-03 DIAGNOSIS — F03918 Unspecified dementia, unspecified severity, with other behavioral disturbance: Secondary | ICD-10-CM

## 2013-11-03 DIAGNOSIS — G609 Hereditary and idiopathic neuropathy, unspecified: Secondary | ICD-10-CM

## 2013-11-03 MED ORDER — ALPRAZOLAM 0.5 MG PO TABS: ORAL_TABLET | ORAL | Status: DC

## 2013-11-03 MED ORDER — DILTIAZEM HCL ER COATED BEADS 120 MG PO CP24: ORAL_CAPSULE | ORAL | Status: DC

## 2013-11-03 NOTE — Progress Notes (Signed)
Patient ID: Ariana [name], female   DOB: 07-11-1929, 80 y.o.   MRN: [id number]   Location:  [facility]   Allergies  Allergen Reactions  . Codeine Nausea Only    unless given with Phenergan  . Doxycycline     Unknown  . Klonopin [Clonazepam]     Causes hallucination   . Meperidine Hcl Nausea Only    unless given with Phenergan  . Naproxen   . Norflex [Orphenadrine Citrate] Nausea Only    Unless given with Phenergan  . Oxycodone-Acetaminophen Nausea Only    unless given with phenergan  . Penicillins     Unknown  . Phenothiazines     Unknown  . Propoxyphene Hcl Nausea Only    unless given with phenergan  . Stelazine     Unknown  . Sulfamethoxazole-Trimethoprim     Unknown  . Tolectin [Tolmetin Sodium]     Unknown  . Tramadol     Unknown  . Zoloft [Sertraline Hcl]     Caused pt to sleep a lot  . Lubiprostone Rash    Chief Complaint  Patient presents with  . Medical Management of Chronic Issues    6 week follow-up on mood, pain and memory     HPI: Patient is a 80 y.o. seen in the office today for 6 week f/u on mood, pain and memory.  She is here with her daughter.    She agrees to the flu shot.  She had an episode of bright red blood per rectum yesterday when she had a hard stool.  She is using otc stool softener and cream.  Has been constipated.  Took fiber supplement and miralax at night.  Senokot was stopped for some reason.  Had to disimpact herself hard balls out of herself.  Visiting angels recommended smooth move tea for her.  Yesterday when she got up, had to go badly and it hurt.  Pressed back there to get it to come out, blood was on finger and when wiped with kleenex.  Has bad hemorrhoids.  Son in law got stool softener and hemorrhoid cream.  Also drinking prune juice.  Xanax and hydrocodone contribute to this.  Having urge urinary incontinence esp first thing in the morning.  Sometimes will stand up and it comes  out.  Can't make it 2.5 feet from bed to toilet.  Has also been to urologist.  Ariana [name] me that at one point she'd been hospitalized and had catheter for a week at SNF before she saw the urologist.  Had catheter removed after a few more days.  Has had worse incontinence since then vs. Before.  Did have some incontinence before.  Seemed like myrbetriq didn't work as well after a while.    Review of Systems:  Review of Systems  Constitutional: Positive for malaise/fatigue. Negative for fever.  HENT: Negative for congestion.   Eyes: Positive for blurred vision.  Respiratory: Negative for shortness of breath.   Cardiovascular: Negative for chest pain and leg swelling.  Gastrointestinal: Positive for constipation and blood in stool. Negative for heartburn, abdominal pain and melena.  Genitourinary: Positive for urgency and frequency. Negative for dysuria.  Musculoskeletal: Positive for back pain and myalgias. Negative for falls.       No falls since last appt  Neurological: Negative for dizziness and loss of consciousness.  Endo/Heme/Allergies: Bruises/bleeds easily.  Psychiatric/Behavioral: Positive for memory loss.     Past Medical History  Diagnosis Date  .  Sjogren's syndrome   . Dry eye syndrome   . Hypertension, benign   . Mitral valve prolapse   . GERD (gastroesophageal reflux disease)   . Diverticulosis of colon   . Irritable bowel syndrome   . Urinary incontinence   . Low back pain syndrome   . Fibromyalgia   . Memory loss   . Anxiety and depression   . History of adverse drug reaction   . Peripheral neuropathy     "both feet and legs"  . Shortness of breath 07/18/11    "alot lately"  . Anginal pain   . History of recurrent UTIs   . H/O hiatal hernia   . Anxiety   . Dementia   . Depression   . Abnormality of gait   . Thyroid nodule   . Headache(784.0) 09/05/2012  . Adenomatous polyp of colon 2002    75mm    Past Surgical History  Procedure Laterality Date  .  Vesicovaginal fistula closure w/ tah    . Appendectomy    . Cholecystectomy  2000  . Mandible surgery    . Temporomandibular joint surgery  1986    Dr. Terence Lux  . Cataract extraction, bilateral    . Abdominal hysterectomy  1967  . Dental surgery      multiple tooth extractions  . Esophagogastroduodenoscopy (egd) with esophageal dilation N/A 08/23/2012    Procedure: ESOPHAGOGASTRODUODENOSCOPY (EGD) WITH ESOPHAGEAL DILATION;  Surgeon: Milus Banister, MD;  Location: [facility];  Service: Endoscopy;  Laterality: N/A;  . Colonoscopy w/ biopsies      multiple  . Nasal septum surgery  1980  . Transthoracic echocardiogram  2001    mild LVH, normal LV  . Nm myocar perf wall motion  2003    persantine - normal static and dynamic study w/apical thinning and presvered LV function, no ischemia  . Cardiac catheterization  02/17/2003    normal L main, LAD free of disease, Cfx free of disease, RCA free of disease (Dr. Rockne Menghini)    Social History:   reports that she has quit smoking. She has never used smokeless tobacco. She reports that she does not drink alcohol or use illicit drugs.  Family History  Problem Relation Age of Onset  . Heart disease Father     heart attack  . Pneumonia Mother   . Heart attack Mother   . Hypertension Mother   . Hypertension Maternal Grandmother   . Colon cancer Sister   . Kidney disease Daughter   . Asthma Daughter   . Arthritis Daughter 26    osteo,  . Heart disease Son 76    stage 3 CHF(Diastolic /Systolic)  . Throat cancer Brother     Medications: Patient's Medications  New Prescriptions   No medications on file  Previous Medications   ALPRAZOLAM (XANAX) 0.5 MG TABLET    Take 1 tablet (0.5 mg total) by mouth 2 (two) times daily as needed for anxiety. Takes 0.5mg  in am, 0.5mg  at 2pm, and 1mg  at bedtime.   ASPIRIN EC 81 MG TABLET    Take 81 mg by mouth daily.   CALCIUM CARBONATE PO    Take 1 tablet by mouth daily.   CARBOXYMETHYLCELLULOSE  (REFRESH TEARS) 0.5 % SOLN    1 drop 2 (two) times daily as needed (dry eyes).    CRANBERRY SOFT PO    Take 1 capsule by mouth daily.   DILTIAZEM (CARDIZEM CD) 120 MG 24 HR CAPSULE    TAKE ONE  CAPSULE BY MOUTH EVERY DAY   DULOXETINE (CYMBALTA) 60 MG CAPSULE    Take 1 capsule (60 mg total) by mouth daily.   HYDROCODONE-ACETAMINOPHEN (NORCO/VICODIN) 5-325 MG PER TABLET    Take 1 tablet by mouth 3 (three) times daily as needed for moderate pain. MAX 3 tablets PER DAY   HYDROXYCHLOROQUINE (PLAQUENIL) 200 MG TABLET    Take 200 mg by mouth daily.    LACTOSE FREE NUTRITION (BOOST PLUS) LIQD    Take 237 mLs by mouth 2 (two) times daily between meals.   LANSOPRAZOLE (PREVACID) 30 MG CAPSULE    Take 1 capsule (30 mg total) by mouth daily at 12 noon.   LISINOPRIL (PRINIVIL,ZESTRIL) 10 MG TABLET    Take 1 tablet (10 mg total) by mouth daily.   MEMANTINE HCL ER (NAMENDA XR TITRATION PACK) 7 & 14 & 21 &28 MG CP24    Take 1 capsule by mouth daily.   METOPROLOL (LOPRESSOR) 50 MG TABLET    Take 1 tablet (50 mg total) by mouth 2 (two) times daily.   NYSTATIN (MYCOSTATIN) 100000 UNIT/ML SUSPENSION    Take 5 mLs (500,000 Units total) by mouth 4 (four) times daily.   PILOCARPINE (SALAGEN) 5 MG TABLET    Take 5 mg by mouth 2 (two) times daily.   POLYETHYLENE GLYCOL (MIRALAX / GLYCOLAX) PACKET    Take 17 g by mouth at bedtime.    SODIUM CHLORIDE (OCEAN) 0.65 % SOLN NASAL SPRAY    Place 1 spray into the nose as needed for congestion.  Modified Medications   No medications on file  Discontinued Medications   DILTIAZEM (TIAZAC) 120 MG 24 HR CAPSULE    Take 120 mg by mouth daily.     Physical Exam: Filed Vitals:   11/03/13 1545  BP: 132/78  Pulse: 65  Temp: 98.1 F (36.7 C)  TempSrc: Oral  Resp: 10  Height: 5\' 4"  (1.626 m)  Weight: 117 lb (53.071 kg)  SpO2: 99%  Physical Exam  Constitutional:  Frail tall white female, nad  Cardiovascular: Normal rate, regular rhythm, normal heart sounds and intact distal  pulses.   Pulmonary/Chest: Effort normal and breath sounds normal. No respiratory distress.  Abdominal: Soft. Bowel sounds are normal. She exhibits no distension and no mass. There is no tenderness.  Hemorrhoids present  Musculoskeletal: Normal range of motion.  Neurological: She is alert.  Oriented to person and place not precise time  Skin: Skin is warm and dry.  Psychiatric:  Talks fast and tells long stories    Labs reviewed: Basic Metabolic Panel:  Recent Labs  07/20/13 2017 07/21/13 0519 07/21/13 1843 07/22/13 0517 07/30/13 1400  NA  --  144  --  141 134*  K  --  2.8* 3.3* 3.6* 5.5*  CL  --  105  --  106 100  CO2  --  28  --  25 28  GLUCOSE  --  94  --  104* 87  BUN  --  8  --  12 24*  CREATININE  --  0.61  --  0.64 0.82  CALCIUM  --  9.3  --  9.4 10.1  MG 1.9  --   --   --   --    Liver Function Tests:  Recent Labs  05/03/13 1119 07/20/13 1630 07/30/13 1400  AST 22 46* 23  ALT 16 57* 23  ALKPHOS 62 96 83  BILITOT 0.6 0.7 0.5  PROT 6.6 7.2 7.1  ALBUMIN 4.1  4.1 4.5    Recent Labs  07/20/13 1630  LIPASE 41   No results found for this basename: AMMONIA,  in the last 8760 hours CBC:  Recent Labs  05/06/13 1845  07/20/13 1630 07/22/13 0517 07/30/13 1402  WBC 7.2  < > 6.5 6.8 7.5  NEUTROABS 3.6  --  3.7  --   --   HGB 13.0  < > 12.9 13.0 13.4  HCT 38.1  < > 37.7 38.2 42.1  MCV 90.9  < > 90.8 89.5 95.8  PLT 264  --  192 213  --   < > = values in this interval not displayed.  Lab Results  Component Value Date   HGBA1C 5.7 05/16/2012    Assessment/Plan 1. ANXIETY DEPRESSION -has been more depressed per her daughter (seems a little better to me today, but this is one point in time) -apparently she is in deep debt from the [employer] and her daughter is trying to deal with that - ALPRAZolam (XANAX) 0.5 MG tablet; 1 by mouth daily as needed  Dispense: 30 tablet; Refill: 0  2. Mixed stress and urge urinary incontinence -was on  myrbetriq at one time, but no longer   3. Need for prophylactic vaccination and inoculation against influenza -given flu shot today  4. Senile dementia, with behavioral disturbance -got up to namenda XR 28 mg daily  5. Therapeutic opioid induced constipation -will try movantik for this and see how she does  6. Hemorrhoids, unspecified hemorrhoid type -cont anusol cream and can use suppositories for this also  7. Low back pain associated with a spinal disorder other than radiculopathy or spinal stenosis -continues on hydrocodone for pain--causes constipation  8.  Peripheral neuropathy -add lyrica for this pain, may also help mood some due to fibromyalgia indication  Labs/tests ordered:  None today Next appt:  2 mos f/u

## 2013-11-03 NOTE — Patient Instructions (Addendum)
Samples given for movantik for constipation from your hydrocodone.   Use this only for your bowels  Samples also given for lyrica for your neuropathy.  Hopefully, we can get your pain controlled with this and get you off hydrocodone so you won't be constipated.

## 2013-11-13 ENCOUNTER — Other Ambulatory Visit: Payer: Self-pay | Admitting: *Deleted

## 2013-11-13 MED ORDER — MEMANTINE HCL ER 28 MG PO CP24: ORAL_CAPSULE | ORAL | Status: DC

## 2013-11-13 NOTE — Telephone Encounter (Signed)
Patient daughter, Vanetta Rule  #[phone] called and requested to be faxed to Pharmacy

## 2013-11-18 ENCOUNTER — Telehealth: Payer: Self-pay | Admitting: Internal Medicine

## 2013-11-18 NOTE — Telephone Encounter (Signed)
Patient's daughter would prefer Dr. Debara Pickett be prescribing MD of diltiazem - was previously Rx'ed by Dr. Lenna Gilford (retired from primary care). Informed Laurian that T. Reed, DO refilled on 11/03/13 with #90 and 1 refill (should last til about March) so she can pick this up from pharmacy for her mom. Daughter voiced understanding.

## 2013-11-18 NOTE — Telephone Encounter (Signed)
Is calling to see if Dr.Hilty name can be placed back on her mother Medication because she has been getting her medication Diltiazem 24 hr/er 120 mg cap from here since he has been Dr. Rex Kras patient . If you have any questions please call    Thanks

## 2013-11-21 ENCOUNTER — Ambulatory Visit: Payer: Medicare Other | Admitting: Nurse Practitioner

## 2013-11-26 ENCOUNTER — Other Ambulatory Visit: Payer: Self-pay | Admitting: *Deleted

## 2013-11-26 DIAGNOSIS — M545 Low back pain, unspecified: Secondary | ICD-10-CM

## 2013-11-26 DIAGNOSIS — IMO0001 Reserved for inherently not codable concepts without codable children: Secondary | ICD-10-CM

## 2013-11-26 MED ORDER — HYDROCODONE-ACETAMINOPHEN 5-325 MG PO TABS: 1.0000 | ORAL_TABLET | Freq: Three times a day (TID) | ORAL | Status: DC | PRN

## 2013-11-26 MED ORDER — NALOXEGOL OXALATE 25 MG PO TABS: 1.0000 | ORAL_TABLET | Freq: Every day | ORAL | Status: DC

## 2013-11-26 NOTE — Telephone Encounter (Signed)
Patient Requested and will pick up 

## 2013-12-03 ENCOUNTER — Telehealth: Payer: Self-pay | Admitting: *Deleted

## 2013-12-03 MED ORDER — NALOXEGOL OXALATE 25 MG PO TABS: 1.0000 | ORAL_TABLET | Freq: Every day | ORAL | Status: DC

## 2013-12-03 NOTE — Telephone Encounter (Signed)
Let's decrease the movantik dose to 12.5mg  daily.  I was unaware of that interaction.  I guess the movantik samples helped her constipation if she is filling it which is good to hear.

## 2013-12-03 NOTE — Telephone Encounter (Signed)
[facility] called and stated that there is a Contraindication Level I with taking Movantik 25mg  with the Diltiazem. Pharmacist said that the [facility] can be decreased. Please Advise.

## 2013-12-03 NOTE — Telephone Encounter (Signed)
Patient called and stated that [facility] lost her Rx for Movantik 25mg  and needed another called to pharmacy. Called and spoke with Pharmacist.

## 2013-12-05 MED ORDER — NALOXEGOL OXALATE 12.5 MG PO TABS: 12.5000 mg | ORAL_TABLET | Freq: Every day | ORAL | Status: DC

## 2013-12-05 NOTE — Addendum Note (Signed)
Addended by: Logan Bores on: 12/05/2013 08:33 AM   Modules accepted: Orders

## 2013-12-05 NOTE — Telephone Encounter (Signed)
Sent rx for decreased dose, called the pharmacy informing them of this change

## 2013-12-08 ENCOUNTER — Encounter: Payer: Self-pay | Admitting: Internal Medicine

## 2013-12-08 ENCOUNTER — Ambulatory Visit (INDEPENDENT_AMBULATORY_CARE_PROVIDER_SITE_OTHER): Payer: Medicare Other | Admitting: Internal Medicine

## 2013-12-08 VITALS — BP 142/70 | HR 73 | Temp 96.4°F | Ht 64.0 in | Wt 122.0 lb

## 2013-12-08 DIAGNOSIS — K5903 Drug induced constipation: Secondary | ICD-10-CM

## 2013-12-08 DIAGNOSIS — K5909 Other constipation: Secondary | ICD-10-CM

## 2013-12-08 DIAGNOSIS — G629 Polyneuropathy, unspecified: Secondary | ICD-10-CM

## 2013-12-08 DIAGNOSIS — Z23 Encounter for immunization: Secondary | ICD-10-CM

## 2013-12-08 DIAGNOSIS — S51002A Unspecified open wound of left elbow, initial encounter: Secondary | ICD-10-CM

## 2013-12-08 DIAGNOSIS — W19XXXA Unspecified fall, initial encounter: Secondary | ICD-10-CM

## 2013-12-08 DIAGNOSIS — S51012A Laceration without foreign body of left elbow, initial encounter: Secondary | ICD-10-CM

## 2013-12-08 DIAGNOSIS — T402X5A Adverse effect of other opioids, initial encounter: Secondary | ICD-10-CM

## 2013-12-08 MED ORDER — PREGABALIN 75 MG PO CAPS: 75.0000 mg | ORAL_CAPSULE | Freq: Two times a day (BID) | ORAL | Status: DC

## 2013-12-08 NOTE — Patient Instructions (Addendum)
Use heat for sore areas.  Hydrocodone for pain.    You should be able to fill the movantik now at 12.5mg  daily for your constipation.    I gave you a prescription for lyrica 75 mg daily for your neuropathy.

## 2013-12-08 NOTE — Progress Notes (Signed)
Patient ID: Ariana [name], female   DOB: 04-27-1929, 80 y.o.   MRN: [id number]   Location:  [facility]  Allergies  Allergen Reactions  . Codeine Nausea Only    unless given with Phenergan  . Doxycycline     Unknown  . Klonopin [Clonazepam]     Causes hallucination   . Meperidine Hcl Nausea Only    unless given with Phenergan  . Naproxen   . Norflex [Orphenadrine Citrate] Nausea Only    Unless given with Phenergan  . Oxycodone-Acetaminophen Nausea Only    unless given with phenergan  . Penicillins     Unknown  . Phenothiazines     Unknown  . Propoxyphene Hcl Nausea Only    unless given with phenergan  . Stelazine     Unknown  . Sulfamethoxazole-Trimethoprim     Unknown  . Tolectin [Tolmetin Sodium]     Unknown  . Tramadol     Unknown  . Zoloft [Sertraline Hcl]     Caused pt to sleep a lot  . Lubiprostone Rash    Chief Complaint  Patient presents with  . Acute Visit    Pt. had two seperate falls 12/05/13 and 12/07/13  . Acute Visit    Pt. fell and was bruised    HPI: Patient is a 80 y.o. white female seen in the office today for acute visit due to falling 2x within 3 days.   First time, was getting ready to go to Dr. Prudencio Burly and she was in the bathroom standing on towel, was putting cream on her legs and feet, towel slipped and went one way and she went the other way.  Bottom very sore, could hardly get up the next day.  Went to bathroom twice and also took some boost.  Sunday, couldn't get hold of her.  Tried two phone calls and couldn't reach her.  Her son in law found her laying on the kitchen floor and there was blood everytwhere and all over the floor and a broken glass.  Has a life alert and fire dept is about 44ft from [name].  Was not wearing life alert b/c necklace part broke and the arm one was too big and looked like it was for a man and wouldn't wear.  Had fallen Sunday morning.  Had taken her morning medications.  Fell  when she reached up to get a glass out of the cabinet.  Another cup fell out and she fell.  She had told her daughter that the "walker made her fall".  Everything hurts now.  Left elbow with large skin tear, small one on posterior am Several red places on buttocks, a few on leg--hurts along edges of her feet on lateral aspects.  Neuropathy was worse  Saw Dr. Prudencio Burly.  Given gel salve for her dry eyes to try for 2 wks and see how it worked--continue if helps.  Left looked like it had sandpaper in it.  Vision was wonderful for having had cataracts and laser treatments.  Needs testing every 6 mos b/c of plaquenil.  He said she actually only needs to come every year now.  Field of vision was not so good when he looked again and wants her to come back in 6 mos after all.    Review of Systems:  Review of Systems  Eyes:       Did not complain about eyes today  Respiratory: Negative for shortness of breath.  Cardiovascular: Negative for chest pain and leg swelling.  Gastrointestinal: Positive for abdominal pain and constipation. Negative for diarrhea, blood in stool and melena.  Genitourinary: Negative for dysuria.  Musculoskeletal: Positive for back pain, falls and myalgias.  Skin: Negative for rash.       Multiple skin injuries as above  Neurological: Negative for dizziness, loss of consciousness and headaches.  Psychiatric/Behavioral: Positive for depression and memory loss.       Mood improved today     Past Medical History  Diagnosis Date  . Sjogren's syndrome   . Dry eye syndrome   . Hypertension, benign   . Mitral valve prolapse   . GERD (gastroesophageal reflux disease)   . Diverticulosis of colon   . Irritable bowel syndrome   . Urinary incontinence   . Low back pain syndrome   . Fibromyalgia   . Memory loss   . Anxiety and depression   . History of adverse drug reaction   . Peripheral neuropathy     "both feet and legs"  . Shortness of breath 07/18/11    "alot lately"  .  Anginal pain   . History of recurrent UTIs   . H/O hiatal hernia   . Anxiety   . Dementia   . Depression   . Abnormality of gait   . Thyroid nodule   . Headache(784.0) 09/05/2012  . Adenomatous polyp of colon 2002    102mm    Past Surgical History  Procedure Laterality Date  . Vesicovaginal fistula closure w/ tah    . Appendectomy    . Cholecystectomy  2000  . Mandible surgery    . Temporomandibular joint surgery  1986    Dr. Terence Lux  . Cataract extraction, bilateral    . Abdominal hysterectomy  1967  . Dental surgery      multiple tooth extractions  . Esophagogastroduodenoscopy (egd) with esophageal dilation N/A 08/23/2012    Procedure: ESOPHAGOGASTRODUODENOSCOPY (EGD) WITH ESOPHAGEAL DILATION;  Surgeon: Milus Banister, MD;  Location: [facility];  Service: Endoscopy;  Laterality: N/A;  . Colonoscopy w/ biopsies      multiple  . Nasal septum surgery  1980  . Transthoracic echocardiogram  2001    mild LVH, normal LV  . Nm myocar perf wall motion  2003    persantine - normal static and dynamic study w/apical thinning and presvered LV function, no ischemia  . Cardiac catheterization  02/17/2003    normal L main, LAD free of disease, Cfx free of disease, RCA free of disease (Dr. Rockne Menghini)    Social History:   reports that she has quit smoking. She has never used smokeless tobacco. She reports that she does not drink alcohol or use illicit drugs.  Family History  Problem Relation Age of Onset  . Heart disease Father     heart attack  . Pneumonia Mother   . Heart attack Mother   . Hypertension Mother   . Hypertension Maternal Grandmother   . Colon cancer Sister   . Kidney disease Daughter   . Asthma Daughter   . Arthritis Daughter 57    osteo,  . Heart disease Son 7    stage 3 CHF(Diastolic /Systolic)  . Throat cancer Brother     Medications: Patient's Medications  New Prescriptions   No medications on file  Previous Medications   ALPRAZOLAM (XANAX) 0.5 MG  TABLET    1 by mouth  daily as needed for anxiety   ASPIRIN EC 81 MG  TABLET    Take 81 mg by mouth daily.   CALCIUM CARBONATE PO    Take 1 tablet by mouth daily.   CARBOXYMETHYLCELLULOSE (REFRESH TEARS) 0.5 % SOLN    1 drop 2 (two) times daily as needed (dry eyes).    CRANBERRY SOFT PO    Take 1 capsule by mouth daily.   DILTIAZEM (CARDIZEM CD) 120 MG 24 HR CAPSULE    TAKE ONE CAPSULE BY MOUTH EVERY DAY   DULOXETINE (CYMBALTA) 60 MG CAPSULE    Take 1 capsule (60 mg total) by mouth daily.   HYDROCODONE-ACETAMINOPHEN (NORCO/VICODIN) 5-325 MG PER TABLET    Take 1 tablet by mouth 3 (three) times daily as needed for moderate pain. MAX 3 tablets PER DAY   HYDROXYCHLOROQUINE (PLAQUENIL) 200 MG TABLET    Take 200 mg by mouth daily.    LACTOSE FREE NUTRITION (BOOST PLUS) LIQD    Take 237 mLs by mouth 2 (two) times daily between meals.   LANSOPRAZOLE (PREVACID) 30 MG CAPSULE    Take 1 capsule (30 mg total) by mouth daily at 12 noon.   LISINOPRIL (PRINIVIL,ZESTRIL) 10 MG TABLET    Take 1 tablet (10 mg total) by mouth daily.   MEMANTINE HCL ER (NAMENDA XR TITRATION PACK) 7 & 14 & 21 &28 MG CP24    Take 1 capsule by mouth daily.   MEMANTINE HCL ER (NAMENDA XR) 28 MG CP24    Take one tablet by mouth once daily to preserve memory   METOPROLOL (LOPRESSOR) 50 MG TABLET    Take 1 tablet (50 mg total) by mouth 2 (two) times daily.   NALOXEGOL OXALATE (MOVANTIK) 12.5 MG TABS    Take 12.5 mg by mouth daily. 1 by mouth daily for constipation   NYSTATIN (MYCOSTATIN) 100000 UNIT/ML SUSPENSION    Take 5 mLs (500,000 Units total) by mouth 4 (four) times daily.   PILOCARPINE (SALAGEN) 5 MG TABLET    Take 5 mg by mouth 2 (two) times daily.   POLYETHYLENE GLYCOL (MIRALAX / GLYCOLAX) PACKET    Take 17 g by mouth at bedtime.    SODIUM CHLORIDE (OCEAN) 0.65 % SOLN NASAL SPRAY    Place 1 spray into the nose as needed for congestion.  Modified Medications   No medications on file  Discontinued Medications   No medications on  file     Physical Exam: Filed Vitals:   12/08/13 1543  BP: 142/70  Pulse: 73  Temp: 96.4 F (35.8 C)  TempSrc: Oral  Height: 5\' 4"  (1.626 m)  Weight: 122 lb (55.339 kg)  SpO2: 98%  Physical Exam  Constitutional:  Tall thin white female, neatly groomed  Cardiovascular: Normal rate, regular rhythm, normal heart sounds and intact distal pulses.   Pulmonary/Chest: Effort normal and breath sounds normal. No respiratory distress.  Abdominal: Soft. Bowel sounds are normal. She exhibits distension. She exhibits no mass. There is no tenderness.  Musculoskeletal: She exhibits tenderness.  Over sacrum and coccyx (previously fractured)  Skin: Skin is warm and dry. There is pallor.  Large skin tear of left elbow from fall, also smaller one on posterior forearm, small one on lateral aspect of left foot which is tender, has large baseball sized ecchymosis on right buttock  Psychiatric:  Pleasant today and answering questions appropriately, not tangential, was nice to her daughter, too    Labs reviewed: Basic Metabolic Panel:  Recent Labs  07/20/13 2017 07/21/13 0519 07/21/13 1843 07/22/13 0517 07/30/13 1400  NA  --  144  --  141 134*  K  --  2.8* 3.3* 3.6* 5.5*  CL  --  105  --  106 100  CO2  --  28  --  25 28  GLUCOSE  --  94  --  104* 87  BUN  --  8  --  12 24*  CREATININE  --  0.61  --  0.64 0.82  CALCIUM  --  9.3  --  9.4 10.1  MG 1.9  --   --   --   --    Liver Function Tests:  Recent Labs  05/03/13 1119 07/20/13 1630 07/30/13 1400  AST 22 46* 23  ALT 16 57* 23  ALKPHOS 62 96 83  BILITOT 0.6 0.7 0.5  PROT 6.6 7.2 7.1  ALBUMIN 4.1 4.1 4.5    Recent Labs  07/20/13 1630  LIPASE 41   No results found for this basename: AMMONIA,  in the last 8760 hours CBC:  Recent Labs  05/06/13 1845  07/20/13 1630 07/22/13 0517 07/30/13 1402  WBC 7.2  < > 6.5 6.8 7.5  NEUTROABS 3.6  --  3.7  --   --   HGB 13.0  < > 12.9 13.0 13.4  HCT 38.1  < > 37.7 38.2 42.1  MCV  90.9  < > 90.8 89.5 95.8  PLT 264  --  192 213  --   < > = values in this interval not displayed. Lipid Panel: No results found for this basename: CHOL, HDL, LDLCALC, TRIG, CHOLHDL, LDLDIRECT,  in the last 8760 hours Lab Results  Component Value Date   HGBA1C 5.7 05/16/2012    Assessment/Plan 1. Fall, initial encounter - with several minor injuries including skin tears and brising, nothing concerning for new fractures, able to ambulate with her cane -advised to use heat for buttock 2. Therapeutic opioid induced constipation -advised to please fill the low dose of movantik that was faxed to the pharmacy the other day--information they sent said it was ok to use at low dose along with her diltiazem and it was helping her according to her daughter 3. Peripheral neuropathy -Rx printed for lyrica for this -is getting worse lately and clearly contributing to falls -has home health aides coming out and has already had therapy -is following with Dr. Jannifer Franklin' office for this/neurology and did have NCS done that confirmed neuropathy - pregabalin (LYRICA) 75 MG capsule; Take 1 capsule (75 mg total) by mouth 2 (two) times daily.  Dispense: 60 capsule; Refill: 3  4. Skin tear of left elbow without complication, initial encounter -unclear what she hit this on  -monitor for s/s of infection -administer tdap booster  5. Need for diphtheria-tetanus-pertussis (Tdap) vaccine, adult/adolescent -is overdue for this anyway  Labs/tests ordered:  No new Next appt:  Keep as scheduled and return prn  Maeleigh Buschman L. Danisha Brassfield, D.O. [facility] [address], [place] [address] Cell Phone (Mon-Fri 8am-5pm):  [phone] On Call:  [phone] & follow prompts after 5pm & weekends Office Phone:  [phone] Office Fax:  [phone]

## 2013-12-18 ENCOUNTER — Ambulatory Visit (INDEPENDENT_AMBULATORY_CARE_PROVIDER_SITE_OTHER): Payer: Medicare Other | Admitting: Nurse Practitioner

## 2013-12-18 ENCOUNTER — Encounter: Payer: Self-pay | Admitting: Nurse Practitioner

## 2013-12-18 VITALS — BP 129/63 | HR 69 | Ht 64.0 in | Wt 124.0 lb

## 2013-12-18 DIAGNOSIS — G589 Mononeuropathy, unspecified: Secondary | ICD-10-CM

## 2013-12-18 DIAGNOSIS — R269 Unspecified abnormalities of gait and mobility: Secondary | ICD-10-CM

## 2013-12-18 DIAGNOSIS — F0390 Unspecified dementia without behavioral disturbance: Secondary | ICD-10-CM

## 2013-12-18 NOTE — Patient Instructions (Signed)
Memory score is stable Continue Lyrica and Cymbalta for peripheral neuropathy Use cane or walker at all times F/U 6 months, next visit with Dr. Jannifer Franklin

## 2013-12-18 NOTE — Progress Notes (Signed)
[facility]  PATIENT: Ariana [name] DOB: 11/14/1929   REASON FOR VISIT: mild memory disturbance, axonal peripheral neuropathy of moderate severity. Gait abnormality  HISTORY OF PRESENT ILLNESS:Ms. Ariana [name], 80 year old returns for followup. She was last seen 05/22/13. In October 2014  she was complaining of numbness in the feet with some pain going up into the legs on the right side. EMG nerve conduction obtained 10/23/ 2014 shows evidence of a primarily axonal peripheral neuropathy of moderate severity, there was no clear evidence of an underlying lumbosacral radiculopathy on the right. She is currently on Cymbalta and Lyrica. She reports today that she's had several falls since last seen she is using a cane and also has a walker.  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 She is accompanied by her daughter at todays visit.her daughter manages her medications. She returns for reevaluation.   HISTORY: mild memory disturbance. The patient was on Aricept, but she was having some diarrhea. The patient was taken off of this medication, but it never helped her bowel issues. The patient continues to complain of numbness in both feet, and some pain going up the legs above the knee on the right. The patient walks with a cane, and she denies any falls since last seen. The patient does have some low back pain. The patient returns to this office for further evaluation. REVIEW OF SYSTEMS: Full 14 system review of systems performed and notable only for those listed, all others are neg:  Constitutional: fatigue Cardiovascular: N/A  Ear/Nose/Throat: N/A  Skin: N/A  Eyes: blurred vision Respiratory: N/A  Gastroitestinal: N/A  Hematology/Lymphatic: N/A  Endocrine: N/A Musculoskeletal:walking difficulty Allergy/Immunology: N/A  Neurological: N/A Psychiatric: N/A Sleep : NA   ALLERGIES: Allergies  Allergen Reactions  . Codeine Nausea Only    unless given with  Phenergan  . Doxycycline     Unknown  . Klonopin [Clonazepam]     Causes hallucination   . Meperidine Hcl Nausea Only    unless given with Phenergan  . Naproxen   . Norflex [Orphenadrine Citrate] Nausea Only    Unless given with Phenergan  . Oxycodone-Acetaminophen Nausea Only    unless given with phenergan  . Penicillins     Unknown  . Phenothiazines     Unknown  . Propoxyphene Hcl Nausea Only    unless given with phenergan  . Stelazine     Unknown  . Sulfamethoxazole-Trimethoprim     Unknown  . Tolectin [Tolmetin Sodium]     Unknown  . Tramadol     Unknown  . Zoloft [Sertraline Hcl]     Caused pt to sleep a lot  . Lubiprostone Rash    HOME MEDICATIONS: Outpatient Prescriptions Prior to Visit  Medication Sig Dispense Refill  . ALPRAZolam (XANAX) 0.5 MG tablet 1 by mouth  daily as needed for anxiety 30 tablet 0  . aspirin EC 81 MG tablet Take 81 mg by mouth daily.    Marland Kitchen CALCIUM CARBONATE PO Take 1 tablet by mouth daily.    . carboxymethylcellulose (REFRESH TEARS) 0.5 % SOLN 1 drop 2 (two) times daily as needed (dry eyes).     Drusilla Kanner SOFT PO Take 1 capsule by mouth daily.    Marland Kitchen diltiazem (CARDIZEM CD) 120 MG 24 hr capsule TAKE ONE CAPSULE BY MOUTH EVERY DAY 90 capsule 1  . DULoxetine (CYMBALTA) 60 MG capsule Take 1 capsule (60 mg total) by mouth daily. 30 capsule 3  .  HYDROcodone-acetaminophen (NORCO/VICODIN) 5-325 MG per tablet Take 1 tablet by mouth 3 (three) times daily as needed for moderate pain. MAX 3 tablets PER DAY 90 tablet 0  . hydroxychloroquine (PLAQUENIL) 200 MG tablet Take 200 mg by mouth daily.     Marland Kitchen lactose free nutrition (BOOST PLUS) LIQD Take 237 mLs by mouth 2 (two) times daily between meals.  0  . lansoprazole (PREVACID) 30 MG capsule Take 1 capsule (30 mg total) by mouth daily at 12 noon. 30 capsule 5  . lisinopril (PRINIVIL,ZESTRIL) 10 MG tablet Take 1 tablet (10 mg total) by mouth daily. 30 tablet 11  . Memantine HCl ER (NAMENDA XR TITRATION  PACK) 7 & 14 & 21 &28 MG CP24 Take 1 capsule by mouth daily. 28 capsule 0  . Memantine HCl ER (NAMENDA XR) 28 MG CP24 Take one tablet by mouth once daily to preserve memory 30 capsule 3  . metoprolol (LOPRESSOR) 50 MG tablet Take 1 tablet (50 mg total) by mouth 2 (two) times daily. 60 tablet 11  . nystatin (MYCOSTATIN) 100000 UNIT/ML suspension Take 5 mLs (500,000 Units total) by mouth 4 (four) times daily. 60 mL 5  . pilocarpine (SALAGEN) 5 MG tablet Take 5 mg by mouth 2 (two) times daily.    . pregabalin (LYRICA) 75 MG capsule Take 1 capsule (75 mg total) by mouth 2 (two) times daily. 60 capsule 3  . sodium chloride (OCEAN) 0.65 % SOLN nasal spray Place 1 spray into the nose as needed for congestion. 30 mL 0  . Naloxegol Oxalate (MOVANTIK) 12.5 MG TABS Take 12.5 mg by mouth daily. 1 by mouth daily for constipation 30 tablet 2  . polyethylene glycol (MIRALAX / GLYCOLAX) packet Take 17 g by mouth at bedtime.      No facility-administered medications prior to visit.    PAST MEDICAL HISTORY: Past Medical History  Diagnosis Date  . Sjogren's syndrome   . Dry eye syndrome   . Hypertension, benign   . Mitral valve prolapse   . GERD (gastroesophageal reflux disease)   . Diverticulosis of colon   . Irritable bowel syndrome   . Urinary incontinence   . Low back pain syndrome   . Fibromyalgia   . Memory loss   . Anxiety and depression   . History of adverse drug reaction   . Peripheral neuropathy     "both feet and legs"  . Shortness of breath 07/18/11    "alot lately"  . Anginal pain   . History of recurrent UTIs   . H/O hiatal hernia   . Anxiety   . Dementia   . Depression   . Abnormality of gait   . Thyroid nodule   . Headache(784.0) 09/05/2012  . Adenomatous polyp of colon 2002    71mm    PAST SURGICAL HISTORY: Past Surgical History  Procedure Laterality Date  . Vesicovaginal fistula closure w/ tah    . Appendectomy    . Cholecystectomy  2000  . Mandible surgery    .  Temporomandibular joint surgery  1986    Dr. Terence Lux  . Cataract extraction, bilateral    . Abdominal hysterectomy  1967  . Dental surgery      multiple tooth extractions  . Esophagogastroduodenoscopy (egd) with esophageal dilation N/A 08/23/2012    Procedure: ESOPHAGOGASTRODUODENOSCOPY (EGD) WITH ESOPHAGEAL DILATION;  Surgeon: Milus Banister, MD;  Location: [facility];  Service: Endoscopy;  Laterality: N/A;  . Colonoscopy w/ biopsies      multiple  .  Nasal septum surgery  1980  . Transthoracic echocardiogram  2001    mild LVH, normal LV  . Nm myocar perf wall motion  2003    persantine - normal static and dynamic study w/apical thinning and presvered LV function, no ischemia  . Cardiac catheterization  02/17/2003    normal L main, LAD free of disease, Cfx free of disease, RCA free of disease (Dr. Rockne Menghini)    FAMILY HISTORY: Family History  Problem Relation Age of Onset  . Heart disease Father     heart attack  . Pneumonia Mother   . Heart attack Mother   . Hypertension Mother   . Hypertension Maternal Grandmother   . Colon cancer Sister   . Kidney disease Daughter   . Asthma Daughter   . Arthritis Daughter 87    osteo,  . Heart disease Son 83    stage 3 CHF(Diastolic /Systolic)  . Throat cancer Brother     SOCIAL HISTORY: History   Social History  . Marital Status: Widowed    Spouse Name: N/A    Number of Children: 2  . Years of Education: 12   Occupational History  . Retired    Social History Main Topics  . Smoking status: Former Research scientist (life sciences)  . Smokeless tobacco: Never Used     Comment: Quit at age 57   . Alcohol Use: No  . Drug Use: No  . Sexual Activity: No   Other Topics Concern  . 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    Patient is Widowed.    Patient has 2 children.    Patient is retired.    Former smoker   Alcohol none   Exercise Walk, exercise chair 4 days a week   POA    Walks with  cane                    PHYSICAL EXAM  Filed Vitals:   12/18/13 1545  BP: 129/63  Pulse: 69  Height: 5\' 4"  (1.626 m)  Weight: 124 lb (56.246 kg)   Body mass index is 21.27 kg/(m^2). Generalized: Well developed, frail-appearing female in no acute distress  Musculoskeletal: No deformity , no significant peripheral edema  Neurological examination   Mentation: Alert oriented to time, place, history taking. MMSE 27/30. AFT 13, Follows all commands speech and language fluent  Cranial nerve II-XII: Pupils were equal round reactive to light extraocular movements were full, visual field were full on confrontational test. Facial sensation and strength were normal. hearing was intact to finger rubbing bilaterally. Uvula tongue midline. head turning and shoulder shrug were normal and symmetric.Tongue protrusion into cheek strength was normal. Motor: normal bulk and tone, full strength in the BUE, BLE, No focal weakness Sensory: Decreased pinprick to about the ankle bilaterally, decreased vibratory to toes  Coordination: finger-nose-finger, heel-to-shin bilaterally, no dysmetria Reflexes: Depressed upper and lower and symmetric, plantar responses were flexor bilaterally. Gait and Station: Rising up from seated position without assistance, wide based unsteady gait with  cane,  unsteady with tandem gait. Romberg negative  DIAGNOSTIC DATA (LABS, IMAGING, TESTING) - I reviewed patient records, labs, notes, testing and imaging myself where available.  Lab Results  Component Value Date   WBC 7.5 07/30/2013   HGB 13.4 07/30/2013   HCT 42.1 07/30/2013   MCV 95.8 07/30/2013   PLT 213 07/22/2013      Component Value  Date/Time   NA 134* 07/30/2013 1400   K 5.5* 07/30/2013 1400   CL 100 07/30/2013 1400   CO2 28 07/30/2013 1400   GLUCOSE 87 07/30/2013 1400   BUN 24* 07/30/2013 1400   CREATININE 0.82 07/30/2013 1400   CREATININE 0.64 07/22/2013 0517   CALCIUM 10.1 07/30/2013 1400    PROT 7.1 07/30/2013 1400   PROT 6.6 12/05/2012 1420   ALBUMIN 4.5 07/30/2013 1400   AST 23 07/30/2013 1400   ALT 23 07/30/2013 1400   ALKPHOS 83 07/30/2013 1400   BILITOT 0.5 07/30/2013 1400   GFRNONAA 81* 07/22/2013 0517   GFRAA >90 07/22/2013 0517   Lab Results  Component Value Date   CHOL 166 05/16/2012   HDL 62.00 05/16/2012   LDLCALC 82 05/16/2012   LDLDIRECT 65.7 07/08/2010   TRIG 112.0 05/16/2012   CHOLHDL 3 05/16/2012   Lab Results  Component Value Date   HGBA1C 5.7 05/16/2012   Lab Results  Component Value Date   VITAMINB12 780 12/05/2012   Lab Results  Component Value Date   TSH 1.105 08/21/2012      ASSESSMENT AND PLAN  80 y.o. year old female  has a past medical history of Sjogren's syndrome; Memory loss; Anxiety and depression; Peripheral neuropathy; Abnormality of gait; here to followup.   Memory score is stable Continue Lyrica and Cymbalta for peripheral neuropathy Use cane or walker at all times F/U 6 months, next visit with Dr. Jerline Pain, [facility], [facility], [facility] [address], [place], [place] [address] [phone]

## 2013-12-18 NOTE — Progress Notes (Signed)
I have read the note, and I agree with the clinical assessment and plan.  Giulliana Mcroberts KEITH   

## 2013-12-25 ENCOUNTER — Other Ambulatory Visit: Payer: Self-pay | Admitting: *Deleted

## 2013-12-25 ENCOUNTER — Telehealth: Payer: Self-pay | Admitting: *Deleted

## 2013-12-25 DIAGNOSIS — IMO0001 Reserved for inherently not codable concepts without codable children: Secondary | ICD-10-CM

## 2013-12-25 DIAGNOSIS — M545 Low back pain, unspecified: Secondary | ICD-10-CM

## 2013-12-25 MED ORDER — HYDROCODONE-ACETAMINOPHEN 5-325 MG PO TABS: 1.0000 | ORAL_TABLET | Freq: Three times a day (TID) | ORAL | Status: DC | PRN

## 2013-12-25 NOTE — Telephone Encounter (Signed)
Patient daughter, Zada called and stated that the Movantik is not on patient's formulary. Patient is still having trouble going to the bathroom.  Please Advise.

## 2013-12-25 NOTE — Telephone Encounter (Signed)
Patient daughter called and requested and will pick up

## 2013-12-25 NOTE — Telephone Encounter (Signed)
It is the one med that is effective for opioid induced constipation and she's pretty much tried everything else.  I recommend they pay for it.  If not, she should use senna s 2 tabs daily and miralax daily.

## 2013-12-29 ENCOUNTER — Telehealth: Payer: Self-pay | Admitting: *Deleted

## 2013-12-29 NOTE — Telephone Encounter (Signed)
Called [employer] for patient's prior Auth for [employer]. I spoke with Claiborne Billings and gave her all the clinical review answers and she said there would be a determination within 24-48 hours. Tera with pharmacy notified at # [phone]

## 2013-12-30 NOTE — Telephone Encounter (Signed)
Called insurance for prior auth. Awaiting approval

## 2014-01-07 ENCOUNTER — Telehealth: Payer: Self-pay | Admitting: *Deleted

## 2014-01-07 NOTE — Telephone Encounter (Signed)
Daughter called regarding note left for Dr Mariea Clonts about her mother bowels, I informed her that that [name] wanted to know if she had started the medication Movantik and if it was working. The daughter stated that she finally received that medication and is taking it and will use some stool softner with it until they see some results. If they don't see some results after the holidays they will get back in touch with the office.

## 2014-01-12 ENCOUNTER — Ambulatory Visit (INDEPENDENT_AMBULATORY_CARE_PROVIDER_SITE_OTHER): Payer: Medicare Other | Admitting: Internal Medicine

## 2014-01-12 ENCOUNTER — Encounter: Payer: Self-pay | Admitting: Internal Medicine

## 2014-01-12 VITALS — BP 120/70 | HR 65 | Temp 97.5°F | Ht 64.0 in | Wt 125.0 lb

## 2014-01-12 DIAGNOSIS — K219 Gastro-esophageal reflux disease without esophagitis: Secondary | ICD-10-CM

## 2014-01-12 DIAGNOSIS — E876 Hypokalemia: Secondary | ICD-10-CM

## 2014-01-12 DIAGNOSIS — I10 Essential (primary) hypertension: Secondary | ICD-10-CM

## 2014-01-12 DIAGNOSIS — F341 Dysthymic disorder: Secondary | ICD-10-CM

## 2014-01-12 DIAGNOSIS — K5903 Drug induced constipation: Secondary | ICD-10-CM

## 2014-01-12 DIAGNOSIS — M545 Low back pain, unspecified: Secondary | ICD-10-CM

## 2014-01-12 DIAGNOSIS — K5909 Other constipation: Secondary | ICD-10-CM

## 2014-01-12 DIAGNOSIS — L570 Actinic keratosis: Secondary | ICD-10-CM

## 2014-01-12 DIAGNOSIS — N3946 Mixed incontinence: Secondary | ICD-10-CM

## 2014-01-12 DIAGNOSIS — F0391 Unspecified dementia with behavioral disturbance: Secondary | ICD-10-CM

## 2014-01-12 DIAGNOSIS — F03918 Unspecified dementia, unspecified severity, with other behavioral disturbance: Secondary | ICD-10-CM

## 2014-01-12 DIAGNOSIS — T402X5A Adverse effect of other opioids, initial encounter: Secondary | ICD-10-CM

## 2014-01-12 MED ORDER — LANSOPRAZOLE 30 MG PO CPDR: 30.0000 mg | DELAYED_RELEASE_CAPSULE | Freq: Every day | ORAL | Status: DC

## 2014-01-12 MED ORDER — ALPRAZOLAM 0.5 MG PO TABS: ORAL_TABLET | ORAL | Status: DC

## 2014-01-12 MED ORDER — DILTIAZEM HCL ER COATED BEADS 120 MG PO CP24: ORAL_CAPSULE | ORAL | Status: DC

## 2014-01-12 MED ORDER — MEMANTINE HCL ER 28 MG PO CP24: ORAL_CAPSULE | ORAL | Status: DC

## 2014-01-12 NOTE — Progress Notes (Signed)
Patient ID: Ariana [name], female   DOB: 1929-07-24, 80 y.o.   MRN: [id number]   Location:  [facility] / [place]  Her daughter is her HCPOA.  Allergies  Allergen Reactions  . Codeine Nausea Only    unless given with Phenergan  . Doxycycline     Unknown  . Klonopin [Clonazepam]     Causes hallucination   . Meperidine Hcl Nausea Only    unless given with Phenergan  . Naproxen   . Norflex [Orphenadrine Citrate] Nausea Only    Unless given with Phenergan  . Oxycodone-Acetaminophen Nausea Only    unless given with phenergan  . Penicillins     Unknown  . Phenothiazines     Unknown  . Propoxyphene Hcl Nausea Only    unless given with phenergan  . Stelazine     Unknown  . Sulfamethoxazole-Trimethoprim     Unknown  . Tolectin [Tolmetin Sodium]     Unknown  . Tramadol     Unknown  . Zoloft [Sertraline Hcl]     Caused pt to sleep a lot  . Lubiprostone Rash    Chief Complaint  Patient presents with  . Follow-up    2 month follow up, also pt. complaining of constipation, and skin rash on back and fore head    HPI: Patient is a 80 y.o.  White female seen in the office today for 2 month f/u on chronic medical conditions.    Constipation:  Finally got the medication last Sunday.  Used movantiq and smooth move tea.  Says she still has some hard balls and needs to disimpact herself.    Has done kegels for her incontinence.  Also was using exercise bands to strengthen thighs.  Has not helped a lot.  Sometimes leaks before she can get to the bathroom.  Rash:  Says she took the shingles shot a year ago and started to get little round red places on her forehead and they would turn into bumps and get crusty.  Has seen dermatologist--had them frozen on her forehead at hairline, but they came back.  Dr. Anabel Bene.  Nerves are worse since her xanax was reduced to once a day.  Review of Systems:  Review of Systems  Constitutional: Positive for  malaise/fatigue. Negative for fever and chills.  HENT: Negative for congestion.   Eyes: Positive for blurred vision.  Respiratory: Negative for shortness of breath.   Cardiovascular: Negative for chest pain.  Gastrointestinal: Positive for constipation. Negative for abdominal pain, blood in stool and melena.  Genitourinary: Negative for dysuria.  Musculoskeletal: Positive for falls.  Skin: Negative for rash.  Neurological: Positive for sensory change and weakness. Negative for dizziness and loss of consciousness.  Psychiatric/Behavioral: Positive for depression and memory loss.     Past Medical History  Diagnosis Date  . Sjogren's syndrome   . Dry eye syndrome   . Hypertension, benign   . Mitral valve prolapse   . GERD (gastroesophageal reflux disease)   . Diverticulosis of colon   . Irritable bowel syndrome   . Urinary incontinence   . Low back pain syndrome   . Fibromyalgia   . Memory loss   . Anxiety and depression   . History of adverse drug reaction   . Peripheral neuropathy     "both feet and legs"  . Shortness of breath 07/18/11    "alot lately"  . Anginal pain   . History of recurrent UTIs   .  H/O hiatal hernia   . Anxiety   . Dementia   . Depression   . Abnormality of gait   . Thyroid nodule   . Headache(784.0) 09/05/2012  . Adenomatous polyp of colon 2002    38mm    Past Surgical History  Procedure Laterality Date  . Vesicovaginal fistula closure w/ tah    . Appendectomy    . Cholecystectomy  2000  . Mandible surgery    . Temporomandibular joint surgery  1986    Dr. Terence Lux  . Cataract extraction, bilateral    . Abdominal hysterectomy  1967  . Dental surgery      multiple tooth extractions  . Esophagogastroduodenoscopy (egd) with esophageal dilation N/A 08/23/2012    Procedure: ESOPHAGOGASTRODUODENOSCOPY (EGD) WITH ESOPHAGEAL DILATION;  Surgeon: Milus Banister, MD;  Location: [facility];  Service: Endoscopy;  Laterality: N/A;  . Colonoscopy w/  biopsies      multiple  . Nasal septum surgery  1980  . Transthoracic echocardiogram  2001    mild LVH, normal LV  . Nm myocar perf wall motion  2003    persantine - normal static and dynamic study w/apical thinning and presvered LV function, no ischemia  . Cardiac catheterization  02/17/2003    normal L main, LAD free of disease, Cfx free of disease, RCA free of disease (Dr. Rockne Menghini)    Social History:   reports that she has quit smoking. She has never used smokeless tobacco. She reports that she does not drink alcohol or use illicit drugs.  Family History  Problem Relation Age of Onset  . Heart disease Father     heart attack  . Pneumonia Mother   . Heart attack Mother   . Hypertension Mother   . Hypertension Maternal Grandmother   . Colon cancer Sister   . Kidney disease Daughter   . Asthma Daughter   . Arthritis Daughter 58    osteo,  . Heart disease Son 10    stage 3 CHF(Diastolic /Systolic)  . Throat cancer Brother     Medications: Patient's Medications  New Prescriptions   No medications on file  Previous Medications   ALPRAZOLAM (XANAX) 0.5 MG TABLET    1 by mouth  daily as needed for anxiety   ASPIRIN EC 81 MG TABLET    Take 81 mg by mouth daily.   CALCIUM CARBONATE PO    Take 1 tablet by mouth daily.   CARBOXYMETHYLCELLULOSE (REFRESH TEARS) 0.5 % SOLN    1 drop 2 (two) times daily as needed (dry eyes).    CRANBERRY SOFT PO    Take 1 capsule by mouth daily.   DILTIAZEM (CARDIZEM CD) 120 MG 24 HR CAPSULE    TAKE ONE CAPSULE BY MOUTH EVERY DAY   DULOXETINE (CYMBALTA) 60 MG CAPSULE    Take 1 capsule (60 mg total) by mouth daily.   HYDROCODONE-ACETAMINOPHEN (NORCO/VICODIN) 5-325 MG PER TABLET    Take 1 tablet by mouth 3 (three) times daily as needed for moderate pain. MAX 3 tablets PER DAY   HYDROXYCHLOROQUINE (PLAQUENIL) 200 MG TABLET    Take 200 mg by mouth daily.    LACTOSE FREE NUTRITION (BOOST PLUS) LIQD    Take 237 mLs by mouth 2 (two) times daily between  meals.   LANSOPRAZOLE (PREVACID) 30 MG CAPSULE    Take 1 capsule (30 mg total) by mouth daily at 12 noon.   LISINOPRIL (PRINIVIL,ZESTRIL) 10 MG TABLET    Take 1 tablet (10  mg total) by mouth daily.   MEMANTINE HCL ER (NAMENDA XR) 28 MG CP24    Take one tablet by mouth once daily to preserve memory   METOPROLOL (LOPRESSOR) 50 MG TABLET    Take 1 tablet (50 mg total) by mouth 2 (two) times daily.   NALOXEGOL OXALATE (MOVANTIK) 12.5 MG TABS    Take 12.5 mg by mouth daily. 1 by mouth daily for constipation   NYSTATIN (MYCOSTATIN) 100000 UNIT/ML SUSPENSION    Take 5 mLs (500,000 Units total) by mouth 4 (four) times daily.   PILOCARPINE (SALAGEN) 5 MG TABLET    Take 5 mg by mouth 2 (two) times daily.   PREGABALIN (LYRICA) 75 MG CAPSULE    Take 1 capsule (75 mg total) by mouth 2 (two) times daily.   SODIUM CHLORIDE (OCEAN) 0.65 % SOLN NASAL SPRAY    Place 1 spray into the nose as needed for congestion.  Modified Medications   No medications on file  Discontinued Medications   MEMANTINE HCL ER (NAMENDA XR TITRATION PACK) 7 & 14 & 21 &28 MG CP24    Take 1 capsule by mouth daily.     Physical Exam: Filed Vitals:   01/12/14 1323  BP: 120/70  Pulse: 65  Temp: 97.5 F (36.4 C)  TempSrc: Oral  Height: 5\' 4"  (1.626 m)  Weight: 125 lb (56.7 kg)  SpO2: 97%  Physical Exam  Constitutional: She is oriented to person, place, and time. No distress.  Cardiovascular: Normal rate, regular rhythm, normal heart sounds and intact distal pulses.   Pulmonary/Chest: Effort normal and breath sounds normal. No respiratory distress.  Abdominal: Soft. Bowel sounds are normal. She exhibits no distension and no mass. There is no tenderness.  Musculoskeletal: Normal range of motion.  Neurological: She is alert and oriented to person, place, and time.  Neuropathic pain in feet, walks with cane, unsteady  Skin: Skin is warm and dry.   Labs reviewed: Basic Metabolic Panel:  Recent Labs  07/20/13 2017  07/21/13 0519 07/21/13 1843 07/22/13 0517 07/30/13 1400  NA  --  144  --  141 134*  K  --  2.8* 3.3* 3.6* 5.5*  CL  --  105  --  106 100  CO2  --  28  --  25 28  GLUCOSE  --  94  --  104* 87  BUN  --  8  --  12 24*  CREATININE  --  0.61  --  0.64 0.82  CALCIUM  --  9.3  --  9.4 10.1  MG 1.9  --   --   --   --    Liver Function Tests:  Recent Labs  05/03/13 1119 07/20/13 1630 07/30/13 1400  AST 22 46* 23  ALT 16 57* 23  ALKPHOS 62 96 83  BILITOT 0.6 0.7 0.5  PROT 6.6 7.2 7.1  ALBUMIN 4.1 4.1 4.5    Recent Labs  07/20/13 1630  LIPASE 41   No results for input(s): AMMONIA in the last 8760 hours. CBC:  Recent Labs  05/06/13 1845  07/20/13 1630 07/22/13 0517 07/30/13 1402  WBC 7.2  < > 6.5 6.8 7.5  NEUTROABS 3.6  --  3.7  --   --   HGB 13.0  < > 12.9 13.0 13.4  HCT 38.1  < > 37.7 38.2 42.1  MCV 90.9  < > 90.8 89.5 95.8  PLT 264  --  192 213  --   < > =  values in this interval not displayed.  Lab Results  Component Value Date   HGBA1C 5.7 05/16/2012    Assessment/Plan 1. ANXIETY DEPRESSION -has not tolerated decrease of xanax to 1 per day, increase again to one in afternoon and one at bedtime--has been very irritable and hard to deal with, tearful at times, will not agree to see psychiatry - ALPRAZolam (XANAX) 0.5 MG tablet; 1 by mouth  daily as needed for anxiety  Dispense: 30 tablet; Refill: 0  2. Therapeutic opioid induced constipation - cont movantik 12.5mg  daily --had been on for a month with benefit by samples, but then it took probably another month for her to get it filled between interaction issues and insurance coverage (higher dose interacts with diltiazem) -if stools remain hard, try senna s 2 daily -cont adequate hydration -do no make changes to what you are doing without communicating them with me  3. Mixed stress and urge urinary incontinence -cont kegels, f/u with urology -tried myrbetriq to no avail  4. Multiple actinic  keratoses -on back and forehead -has already been evaluated by dermatology for this and has had them frozen off before  5. Senile dementia, with behavioral disturbance -continues to lose money and does not recall what she is doing with money she withdraws from the bank -cont namenda XR  6. Low back pain associated with a spinal disorder other than radiculopathy or spinal stenosis -cont hydrocodone  Labs/tests ordered: Orders Placed This Encounter  Procedures  . Basic metabolic panel    Next appt:  3 mos  Aashrith Eves L. Britzy Graul, D.O. [facility] [address],  [address] Cell Phone (Mon-Fri 8am-5pm):  [phone] On Call:  [phone] & follow prompts after 5pm & weekends Office Phone:  [phone] Office Fax:  [phone]

## 2014-01-12 NOTE — Patient Instructions (Signed)
Please take the [facility] prescription medication daily as directed. If your stools remain hard, please take senna s two tablets daily for soft stools.

## 2014-01-13 LAB — BASIC METABOLIC PANEL
BUN/Creatinine Ratio: 28 — ABNORMAL HIGH (ref 11–26)
BUN: 20 mg/dL (ref 8–27)
CO2: 28 mmol/L (ref 18–29)
Calcium: 9.7 mg/dL (ref 8.7–10.3)
Chloride: 98 mmol/L (ref 97–108)
Creatinine, Ser: 0.72 mg/dL (ref 0.57–1.00)
GFR calc Af Amer: 90 mL/min/{1.73_m2} (ref >59)
GFR calc non Af Amer: 78 mL/min/{1.73_m2} (ref >59)
Glucose: 74 mg/dL (ref 65–99)
Potassium: 4.6 mmol/L (ref 3.5–5.2)
Sodium: 140 mmol/L (ref 134–144)

## 2014-01-26 ENCOUNTER — Other Ambulatory Visit: Payer: Self-pay | Admitting: *Deleted

## 2014-01-26 DIAGNOSIS — M545 Low back pain, unspecified: Secondary | ICD-10-CM

## 2014-01-26 DIAGNOSIS — IMO0001 Reserved for inherently not codable concepts without codable children: Secondary | ICD-10-CM

## 2014-01-26 MED ORDER — HYDROCODONE-ACETAMINOPHEN 5-325 MG PO TABS: 1.0000 | ORAL_TABLET | Freq: Three times a day (TID) | ORAL | Status: DC | PRN

## 2014-01-26 NOTE — Telephone Encounter (Signed)
Patient daughter requested and will pick up 

## 2014-01-30 ENCOUNTER — Telehealth: Payer: Self-pay | Admitting: *Deleted

## 2014-01-30 NOTE — Telephone Encounter (Signed)
Patient daughter called and stated that patient is breaking out in welps and itching. She thinks it is coming from the [place]. Breaking out on back and arms are almost leather where she has been itching. Is going to use Aveno and itching cream, is this ok. And please advise on the medication.

## 2014-01-30 NOTE — Telephone Encounter (Signed)
She can stop the movantik and see if it gets better.  Use benadryl cream for the itching.  No oral benadryl--this will make her drowsy and confused and at risk of falls

## 2014-02-02 NOTE — Telephone Encounter (Signed)
LMOM to return call.

## 2014-02-03 NOTE — Telephone Encounter (Signed)
Patient daughter, Decie Notified.

## 2014-02-07 ENCOUNTER — Other Ambulatory Visit: Payer: Self-pay | Admitting: Internal Medicine

## 2014-02-14 ENCOUNTER — Other Ambulatory Visit: Payer: Self-pay | Admitting: Internal Medicine

## 2014-02-24 ENCOUNTER — Other Ambulatory Visit: Payer: Self-pay

## 2014-02-24 DIAGNOSIS — M545 Low back pain, unspecified: Secondary | ICD-10-CM

## 2014-02-24 DIAGNOSIS — IMO0001 Reserved for inherently not codable concepts without codable children: Secondary | ICD-10-CM

## 2014-02-24 MED ORDER — HYDROCODONE-ACETAMINOPHEN 5-325 MG PO TABS: 1.0000 | ORAL_TABLET | Freq: Three times a day (TID) | ORAL | Status: DC | PRN

## 2014-02-24 NOTE — Telephone Encounter (Signed)
Message left on triage voicemail, refill hydrocodone, due this week.  I called Sidrah Harden (patient's daughter) and informed her rx is ready for pick-up.

## 2014-02-25 ENCOUNTER — Ambulatory Visit (INDEPENDENT_AMBULATORY_CARE_PROVIDER_SITE_OTHER): Payer: Medicare Other | Admitting: Internal Medicine

## 2014-02-25 ENCOUNTER — Encounter: Payer: Self-pay | Admitting: Internal Medicine

## 2014-02-25 VITALS — BP 110/72 | HR 64 | Temp 97.4°F | Resp 20 | Wt 130.8 lb

## 2014-02-25 DIAGNOSIS — L239 Allergic contact dermatitis, unspecified cause: Secondary | ICD-10-CM

## 2014-02-25 DIAGNOSIS — L03113 Cellulitis of right upper limb: Secondary | ICD-10-CM

## 2014-02-25 MED ORDER — CEPHALEXIN 500 MG PO CAPS: 500.0000 mg | ORAL_CAPSULE | Freq: Two times a day (BID) | ORAL | Status: DC

## 2014-02-25 MED ORDER — PREDNISONE 10 MG PO TABS: ORAL_TABLET | ORAL | Status: DC

## 2014-02-25 NOTE — Progress Notes (Signed)
Patient ID: LILANA BLASKO, female   DOB: 06-Oct-1929, 81 y.o.   MRN: [id number]    Facility  [facility]    Place of Service:   OFFICE   Allergies  Allergen Reactions  . Codeine Nausea Only    unless given with Phenergan  . Doxycycline     Unknown  . Klonopin [Clonazepam]     Causes hallucination   . Meperidine Hcl Nausea Only    unless given with Phenergan  . Naproxen   . Norflex [Orphenadrine Citrate] Nausea Only    Unless given with Phenergan  . Oxycodone-Acetaminophen Nausea Only    unless given with phenergan  . Penicillins     Unknown  . Phenothiazines     Unknown  . Propoxyphene Hcl Nausea Only    unless given with phenergan  . Stelazine     Unknown  . Sulfamethoxazole-Trimethoprim     Unknown  . Tolectin [Tolmetin Sodium]     Unknown  . Tramadol     Unknown  . Zoloft [Sertraline Hcl]     Caused pt to sleep a lot  . Lubiprostone Rash    Chief Complaint  Patient presents with  . Medical Management of Chronic Issues    itching and swelling    HPI:   81 yo female c/o 1 month hx truncal rash, itchy. Tried bendadryl and avoids detergents with parfumes. Noticed right forearm increased redness and swelling x several days. No f/c. No known insect bites or trauma  Medications: Patient's Medications  New Prescriptions   No medications on file  Previous Medications   ALPRAZOLAM (XANAX) 0.5 MG TABLET    TAKE 1 TABLET BY MOUTH EVERY DAY AND 1 TABLET AT BEDTIME AS NEEDED FOR ANXIETY   ASPIRIN EC 81 MG TABLET    Take 81 mg by mouth daily.   CALCIUM CARBONATE PO    Take 1 tablet by mouth daily.   CARBOXYMETHYLCELLULOSE (REFRESH TEARS) 0.5 % SOLN    1 drop 2 (two) times daily as needed (dry eyes).    CRANBERRY SOFT PO    Take 1 capsule by mouth daily.   DILTIAZEM (CARDIZEM CD) 120 MG 24 HR CAPSULE    TAKE ONE CAPSULE BY MOUTH EVERY DAY   DULOXETINE (CYMBALTA) 60 MG CAPSULE    Take 1 capsule (60 mg total) by mouth daily.   HYDROCODONE-ACETAMINOPHEN (NORCO/VICODIN) 5-325  MG PER TABLET    Take 1 tablet by mouth 3 (three) times daily as needed for moderate pain. MAX 3 tablets PER DAY   HYDROXYCHLOROQUINE (PLAQUENIL) 200 MG TABLET    Take 200 mg by mouth daily.    LACTOSE FREE NUTRITION (BOOST PLUS) LIQD    Take 237 mLs by mouth 2 (two) times daily between meals.   LANSOPRAZOLE (PREVACID) 30 MG CAPSULE    Take 1 capsule (30 mg total) by mouth daily at 12 noon.   LISINOPRIL (PRINIVIL,ZESTRIL) 10 MG TABLET    Take 1 tablet (10 mg total) by mouth daily.   MEMANTINE HCL ER (NAMENDA XR) 28 MG CP24    Take one tablet by mouth once daily to preserve memory   METOPROLOL (LOPRESSOR) 50 MG TABLET    Take 1 tablet (50 mg total) by mouth 2 (two) times daily.   NALOXEGOL OXALATE (MOVANTIK) 12.5 MG TABS    Take 12.5 mg by mouth daily. 1 by mouth daily for constipation   NYSTATIN (MYCOSTATIN) 100000 UNIT/ML SUSPENSION    TAKE 1 TEASPOONFUL BY MOUTH 4 TIMES A  DAY   PILOCARPINE (SALAGEN) 5 MG TABLET    Take 5 mg by mouth 2 (two) times daily.   PREGABALIN (LYRICA) 75 MG CAPSULE    Take 1 capsule (75 mg total) by mouth 2 (two) times daily.   SODIUM CHLORIDE (OCEAN) 0.65 % SOLN NASAL SPRAY    Place 1 spray into the nose as needed for congestion.  Modified Medications   No medications on file  Discontinued Medications   No medications on file     Review of Systems   As above. All other systems reviewed are negative  Filed Vitals:   02/25/14 1529  BP: 110/72  Pulse: 64  Temp: 97.4 F (36.3 C)  TempSrc: Oral  Resp: 20  Weight: 130 lb 12.8 oz (59.33 kg)  SpO2: 95%   Body mass index is 22.44 kg/(m^2).  Physical Exam  GEN: looks anxious in NAD. Daughter present SKIN: right forearm red, swollen, increased warmth to touch and extends from wrist to distal arm. No vesicles. Rash on back is blancheable but no vesicles. NT. No purulent d/c.  Labs reviewed: Office Visit on 01/12/2014  Component Date Value Ref Range Status  . Glucose 01/12/2014 74  65 - 99 mg/dL Final  . BUN  01/12/2014 20  8 - 27 mg/dL Final  . Creatinine, Ser 01/12/2014 0.72  0.57 - 1.00 mg/dL Final  . GFR calc non Af Amer 01/12/2014 78  >59 mL/min/1.73 Final  . GFR calc Af Amer 01/12/2014 90  >59 mL/min/1.73 Final  . BUN/Creatinine Ratio 01/12/2014 28* 11 - 26 Final  . Sodium 01/12/2014 140  134 - 144 mmol/L Final  . Potassium 01/12/2014 4.6  3.5 - 5.2 mmol/L Final  . Chloride 01/12/2014 98  97 - 108 mmol/L Final  . CO2 01/12/2014 28  18 - 29 mmol/L Final  . Calcium 01/12/2014 9.7  8.7 - 10.3 mg/dL Final   Chart reviewed  Assessment/Plan    ICD-9-CM ICD-10-CM   1. Cellulitis of right forearm 682.3 L03.113 cephALEXin (KEFLEX) 500 MG capsule  2. Contact dermatitis, allergic 692.9 L23.9 predniSONE (DELTASONE) 10 MG tablet    --apply cool compress prn to rash and right forearm to reduce itching  --avoid scratching  --take all abx as Rx  --f/u with Dr Mariea Clonts as scheduled. RTO if sx's do not improve or worsen in next 72-96 hrs.  Eryn Marandola S. Perlie Gold  [facility] [address] [place], [place] [address] [phone] Office (Wednesdays and Fridays 8 AM - 5 PM) [phone] Cell (Monday-Friday 8 AM - 5 PM)

## 2014-02-28 ENCOUNTER — Encounter: Payer: Self-pay | Admitting: Internal Medicine

## 2014-03-23 ENCOUNTER — Ambulatory Visit (INDEPENDENT_AMBULATORY_CARE_PROVIDER_SITE_OTHER): Payer: Medicare Other | Admitting: Internal Medicine

## 2014-03-23 ENCOUNTER — Telehealth: Payer: Self-pay | Admitting: *Deleted

## 2014-03-23 ENCOUNTER — Ambulatory Visit (INDEPENDENT_AMBULATORY_CARE_PROVIDER_SITE_OTHER): Payer: Medicare Other | Admitting: Neurology

## 2014-03-23 ENCOUNTER — Encounter: Payer: Self-pay | Admitting: Neurology

## 2014-03-23 ENCOUNTER — Encounter: Payer: Self-pay | Admitting: Internal Medicine

## 2014-03-23 VITALS — BP 112/64 | HR 65 | Temp 97.5°F | Resp 18 | Ht 63.0 in | Wt 136.0 lb

## 2014-03-23 VITALS — BP 126/72 | HR 81 | Ht 63.0 in | Wt 136.4 lb

## 2014-03-23 DIAGNOSIS — IMO0001 Reserved for inherently not codable concepts without codable children: Secondary | ICD-10-CM

## 2014-03-23 DIAGNOSIS — K5909 Other constipation: Secondary | ICD-10-CM

## 2014-03-23 DIAGNOSIS — M545 Low back pain, unspecified: Secondary | ICD-10-CM

## 2014-03-23 DIAGNOSIS — K5903 Drug induced constipation: Secondary | ICD-10-CM

## 2014-03-23 DIAGNOSIS — T402X5A Adverse effect of other opioids, initial encounter: Secondary | ICD-10-CM

## 2014-03-23 DIAGNOSIS — N3946 Mixed incontinence: Secondary | ICD-10-CM

## 2014-03-23 DIAGNOSIS — G609 Hereditary and idiopathic neuropathy, unspecified: Secondary | ICD-10-CM

## 2014-03-23 DIAGNOSIS — L259 Unspecified contact dermatitis, unspecified cause: Secondary | ICD-10-CM

## 2014-03-23 DIAGNOSIS — G629 Polyneuropathy, unspecified: Secondary | ICD-10-CM

## 2014-03-23 DIAGNOSIS — F4323 Adjustment disorder with mixed anxiety and depressed mood: Secondary | ICD-10-CM

## 2014-03-23 DIAGNOSIS — R269 Unspecified abnormalities of gait and mobility: Secondary | ICD-10-CM

## 2014-03-23 DIAGNOSIS — M609 Myositis, unspecified: Secondary | ICD-10-CM

## 2014-03-23 DIAGNOSIS — R413 Other amnesia: Secondary | ICD-10-CM

## 2014-03-23 DIAGNOSIS — M791 Myalgia: Secondary | ICD-10-CM

## 2014-03-23 MED ORDER — PREGABALIN 75 MG PO CAPS: 75.0000 mg | ORAL_CAPSULE | Freq: Two times a day (BID) | ORAL | Status: DC

## 2014-03-23 MED ORDER — HYDROCODONE-ACETAMINOPHEN 5-325 MG PO TABS: 1.0000 | ORAL_TABLET | Freq: Three times a day (TID) | ORAL | Status: DC | PRN

## 2014-03-23 MED ORDER — DULOXETINE HCL 30 MG PO CPEP: ORAL_CAPSULE | ORAL | Status: DC

## 2014-03-23 MED ORDER — ALPRAZOLAM 0.5 MG PO TABS: 0.5000 mg | ORAL_TABLET | Freq: Every evening | ORAL | Status: DC | PRN

## 2014-03-23 MED ORDER — CETIRIZINE HCL 10 MG PO TABS: 10.0000 mg | ORAL_TABLET | Freq: Every day | ORAL | Status: DC

## 2014-03-23 NOTE — Progress Notes (Signed)
Patient ID: Ariana [name], female   DOB: 23-Feb-1929, 81 y.o.   MRN: [id number]   Location:  [facility]   Allergies  Allergen Reactions  . Codeine Nausea Only    unless given with Phenergan  . Doxycycline     Unknown  . Klonopin [Clonazepam]     Causes hallucination   . Meperidine Hcl Nausea Only    unless given with Phenergan  . Naproxen   . Norflex [Orphenadrine Citrate] Nausea Only    Unless given with Phenergan  . Oxycodone-Acetaminophen Nausea Only    unless given with phenergan  . Penicillins     Unknown  . Phenothiazines     Unknown  . Propoxyphene Hcl Nausea Only    unless given with phenergan  . Stelazine     Unknown  . Sulfamethoxazole-Trimethoprim     Unknown  . Tolectin [Tolmetin Sodium]     Unknown  . Tramadol     Unknown  . Zoloft [Sertraline Hcl]     Caused pt to sleep a lot  . Lubiprostone Rash    Chief Complaint  Patient presents with  . Medical Management of Chronic Issues    f/u for constipation    HPI: Patient is a 81 y.o.  seen in the office today for  Changed laundry detergents, washed sheets, clothing, already using organic soaps, no new meds.  Movantik was stopped w/o any improvement.  Then got infection on forearm treated with keflex.  cellulitis resolved  Is off the bladder med-myrbetriq.  Is not making it to the bathroom almost ever now.  Has tolerated it but she felt it no longer helped.    Also having difficulty with her bowels again off linzess.  Does not adhere to any regimen for any length of time.    Review of Systems:  Review of Systems  Constitutional: Positive for malaise/fatigue. Negative for fever and chills.  Gastrointestinal: Positive for constipation. Negative for abdominal pain, diarrhea, blood in stool and melena.       Hemorrhoids  Genitourinary: Positive for urgency and frequency. Negative for dysuria and hematuria.  Musculoskeletal: Positive for myalgias and back pain.  Negative for falls.  Skin: Positive for itching and rash.  Neurological: Positive for weakness.  Psychiatric/Behavioral: Positive for depression and memory loss. The patient is nervous/anxious.      Past Medical History  Diagnosis Date  . Sjogren's syndrome   . Dry eye syndrome   . Hypertension, benign   . Mitral valve prolapse   . GERD (gastroesophageal reflux disease)   . Diverticulosis of colon   . Irritable bowel syndrome   . Urinary incontinence   . Low back pain syndrome   . Fibromyalgia   . Memory loss   . Anxiety and depression   . History of adverse drug reaction   . Peripheral neuropathy     "both feet and legs"  . Shortness of breath 07/18/11    "alot lately"  . Anginal pain   . History of recurrent UTIs   . H/O hiatal hernia   . Anxiety   . Dementia   . Depression   . Abnormality of gait   . Thyroid nodule   . Headache(784.0) 09/05/2012  . Adenomatous polyp of colon 2002    41mm    Past Surgical History  Procedure Laterality Date  . Vesicovaginal fistula closure w/ tah    . Appendectomy    . Cholecystectomy  2000  .  Mandible surgery    . Temporomandibular joint surgery  1986    Dr. Terence Lux  . Cataract extraction, bilateral    . Abdominal hysterectomy  1967  . Dental surgery      multiple tooth extractions  . Esophagogastroduodenoscopy (egd) with esophageal dilation N/A 08/23/2012    Procedure: ESOPHAGOGASTRODUODENOSCOPY (EGD) WITH ESOPHAGEAL DILATION;  Surgeon: Milus Banister, MD;  Location: [facility];  Service: Endoscopy;  Laterality: N/A;  . Colonoscopy w/ biopsies      multiple  . Nasal septum surgery  1980  . Transthoracic echocardiogram  2001    mild LVH, normal LV  . Nm myocar perf wall motion  2003    persantine - normal static and dynamic study w/apical thinning and presvered LV function, no ischemia  . Cardiac catheterization  02/17/2003    normal L main, LAD free of disease, Cfx free of disease, RCA free of disease (Dr. Rockne Menghini)     Social History:   reports that she has quit smoking. She has never used smokeless tobacco. She reports that she does not drink alcohol or use illicit drugs.  Family History  Problem Relation Age of Onset  . Heart disease Father     heart attack  . Pneumonia Mother   . Heart attack Mother   . Hypertension Mother   . Hypertension Maternal Grandmother   . Colon cancer Sister   . Kidney disease Daughter   . Asthma Daughter   . Arthritis Daughter 12    osteo,  . Heart disease Son 26    stage 3 CHF(Diastolic /Systolic)  . Throat cancer Brother     Medications: Patient's Medications  New Prescriptions   No medications on file  Previous Medications   ALPRAZOLAM (XANAX) 0.5 MG TABLET    TAKE 1 TABLET BY MOUTH EVERY DAY AND 1 TABLET AT BEDTIME AS NEEDED FOR ANXIETY   ASPIRIN EC 81 MG TABLET    Take 81 mg by mouth daily.   CALCIUM CARBONATE PO    Take 1 tablet by mouth daily.   CARBOXYMETHYLCELLULOSE (REFRESH TEARS) 0.5 % SOLN    1 drop 2 (two) times daily as needed (dry eyes).    CRANBERRY SOFT PO    Take 1 capsule by mouth daily.   DILTIAZEM (CARDIZEM CD) 120 MG 24 HR CAPSULE    TAKE ONE CAPSULE BY MOUTH EVERY DAY   DULOXETINE (CYMBALTA) 30 MG CAPSULE    One capsule in the morning and two capsules in the evening   HYDROCODONE-ACETAMINOPHEN (NORCO/VICODIN) 5-325 MG PER TABLET    Take 1 tablet by mouth 3 (three) times daily as needed for moderate pain. MAX 3 tablets PER DAY   HYDROXYCHLOROQUINE (PLAQUENIL) 200 MG TABLET    Take 200 mg by mouth daily.    LACTOSE FREE NUTRITION (BOOST PLUS) LIQD    Take 237 mLs by mouth 2 (two) times daily between meals.   LANSOPRAZOLE (PREVACID) 30 MG CAPSULE    Take 1 capsule (30 mg total) by mouth daily at 12 noon.   LISINOPRIL (PRINIVIL,ZESTRIL) 10 MG TABLET    Take 1 tablet (10 mg total) by mouth daily.   MEMANTINE HCL ER (NAMENDA XR) 28 MG CP24    Take one tablet by mouth once daily to preserve memory   METOPROLOL (LOPRESSOR) 50 MG TABLET     Take 1 tablet (50 mg total) by mouth 2 (two) times daily.   NYSTATIN (MYCOSTATIN) 100000 UNIT/ML SUSPENSION    TAKE 1 TEASPOONFUL BY  MOUTH 4 TIMES A DAY   PILOCARPINE (SALAGEN) 5 MG TABLET    Take 5 mg by mouth 2 (two) times daily.   PREGABALIN (LYRICA) 75 MG CAPSULE    Take 1 capsule (75 mg total) by mouth 2 (two) times daily.   SODIUM CHLORIDE (OCEAN) 0.65 % SOLN NASAL SPRAY    Place 1 spray into the nose as needed for congestion.  Modified Medications   No medications on file  Discontinued Medications   No medications on file     Physical Exam: Filed Vitals:   03/23/14 1321  BP: 112/64  Pulse: 65  Temp: 97.5 F (36.4 C)  TempSrc: Oral  Resp: 18  Height: 5\' 3"  (1.6 m)  Weight: 136 lb (61.689 kg)  SpO2: 98%  Physical Exam  Cardiovascular: Normal rate, regular rhythm and normal heart sounds.   Pulmonary/Chest: Effort normal and breath sounds normal.  Skin: No pallor.  Diffuse pruritic rash with excoriations, raised, inflamed; involves face on cheeks, chest, abdomen, back is worst, arms, small amount on thighs, none on lower legs or mucus membranes  Psychiatric:  Tangential, anxious   Labs reviewed: Basic Metabolic Panel:  Recent Labs  07/20/13 2017  07/22/13 0517 07/30/13 1400 01/12/14 1452  NA  --   < > 141 134* 140  K  --   < > 3.6* 5.5* 4.6  CL  --   < > 106 100 98  CO2  --   < > 25 28 28   GLUCOSE  --   < > 104* 87 74  BUN  --   < > 12 24* 20  CREATININE  --   < > 0.64 0.82 0.72  CALCIUM  --   < > 9.4 10.1 9.7  MG 1.9  --   --   --   --   < > = values in this interval not displayed. Liver Function Tests:  Recent Labs  05/03/13 1119 07/20/13 1630 07/30/13 1400  AST 22 46* 23  ALT 16 57* 23  ALKPHOS 62 96 83  BILITOT 0.6 0.7 0.5  PROT 6.6 7.2 7.1  ALBUMIN 4.1 4.1 4.5    Recent Labs  07/20/13 1630  LIPASE 41   No results for input(s): AMMONIA in the last 8760 hours. CBC:  Recent Labs  05/06/13 1845  07/20/13 1630 07/22/13 0517  07/30/13 1402  WBC 7.2  < > 6.5 6.8 7.5  NEUTROABS 3.6  --  3.7  --   --   HGB 13.0  < > 12.9 13.0 13.4  HCT 38.1  < > 37.7 38.2 42.1  MCV 90.9  < > 90.8 89.5 95.8  PLT 264  --  192 213  --   < > = values in this interval not displayed. Lipid Panel: No results for input(s): CHOL, HDL, LDLCALC, TRIG, CHOLHDL, LDLDIRECT in the last 8760 hours. Lab Results  Component Value Date   HGBA1C 5.7 05/16/2012   Assessment/Plan 1. Peripheral neuropathy -longstanding, helped with lyrica--due for renewal - pregabalin (LYRICA) 75 MG capsule; Take 1 capsule (75 mg total) by mouth 2 (two) times daily.  Dispense: 60 capsule; Refill: 3  2. Myalgia and myositis -chronic pain--medication was refilled - HYDROcodone-acetaminophen (NORCO/VICODIN) 5-325 MG per tablet; Take 1 tablet by mouth 3 (three) times daily as needed for moderate pain. MAX 3 tablets PER DAY  Dispense: 90 tablet; Refill: 0  3. Low back pain associated with a spinal disorder other than radiculopathy or spinal stenosis - cont her  lyrica which has been helpful - pregabalin (LYRICA) 75 MG capsule; Take 1 capsule (75 mg total) by mouth 2 (two) times daily.  Dispense: 60 capsule; Refill: 3  4. Adjustment disorder with mixed anxiety and depressed mood - has not benefited from lorazepam before, already on cymbalta and has tried numerous ssris and snris with her psychiatrist - ALPRAZolam (XANAX) 0.5 MG tablet; Take 1 tablet (0.5 mg total) by mouth at bedtime as needed for anxiety.  Dispense: 60 tablet; Refill: 0  5. Contact dermatitis - etiology unclear -stopping movantik did not help and modification of her detergents, soaps has not helped to the "free" types -has numerous meds that can potentially cause this, several which require titration to dose -does not look like typical reaction to hydroxychloroquine, but this is probably the biggest rash culprit in her list as far as I know -did not respond to prednisone which is bizarre to me and  does not look fungal, also does not look like scabies and no one else has it - Ambulatory referral to Allergy - cetirizine (ZYRTEC) 10 MG tablet; Take 1 tablet (10 mg total) by mouth daily.  Dispense: 14 tablet; Refill: 0  6. Therapeutic opioid induced constipation -stopped movantik due to her rash, but rash has not gotten better -is afraid to retry and had insurance difficulties also -restart using senna s two tabs per day and miralax daily if no bm--if gets loose, hold the medications  7. Mixed incontinence -restart myrbetriq as this did seem to help at least temporarily  Labs/tests ordered:  No new Next appt:  6 wks  Lanett Lasorsa L. Talmadge Ganas, D.O. [facility] [address], [place] [address] Cell Phone (Mon-Fri 8am-5pm):  [phone] On Call:  [phone] & follow prompts after 5pm & weekends Office Phone:  [phone] Office Fax:  [phone]

## 2014-03-23 NOTE — Patient Instructions (Signed)

## 2014-03-23 NOTE — Telephone Encounter (Signed)
Called patient [employer] [phone] and spoke with Clare Gandy regarding prior auth for patient's medication Alprazolam. Clare Gandy will be faxing a form to be filled out. [name] aware.

## 2014-03-23 NOTE — Patient Instructions (Addendum)
Take senna s 2 tabs daily. Take miralax daily if you do not have a bowel movement.   If you start having loose stool, then stop the senna and the miralax for 1-2 days  Try restarting your myrbetriq to see if you can decrease how often you wet your pants.

## 2014-03-23 NOTE — Progress Notes (Signed)
Reason for visit: Peripheral neuropathy  Ariana [name] is an 81 y.o. female  History of present illness:  Ariana [name] is an 81 year old right-handed white female with a history of a peripheral neuropathy, chronic low back pain, and lower extremity discomfort. The patient also has a memory disorder. She lives at home, she has a caretaker during the day for about 8 hours, but she spends much of her time alone. She indicates that she was seen by Dr. Vertell Limber in 2014 for her back issues, and she indicates that a MRI study of the low back was done. The results of this are not available to me, but the patient was told that they could not do surgery on her secondary to her medical issues. The patient has been walking with a walker, she continues to fall on a regular basis. The patient last fell about 5 days prior to this evaluation. She indicates that her left leg may collapse on her. She has more pain in the left leg than she does on the right, and she indicates that the pain is worse when she is up on her feet rather than when she is trying to rest. She is on Cymbalta taking 60 mg daily, the daughter indicates that the 120 mg dosing of the medication caused some troubles with confusion. She did better on the 90 mg a day dose. She is on Lyrica taking 75 mg twice daily. She had physical therapy ordered recently, but the patient did not have very many sessions of physical therapy, as she would indicate that she felt sick and she would not allow for the therapy to be done. The patient recently has developed a generalized skin rash felt secondary to a contact dermatitis. She returns to this office for an evaluation.  Past Medical History  Diagnosis Date  . Sjogren's syndrome   . Dry eye syndrome   . Hypertension, benign   . Mitral valve prolapse   . GERD (gastroesophageal reflux disease)   . Diverticulosis of colon   . Irritable bowel syndrome   . Urinary incontinence   . Low back pain syndrome   .  Fibromyalgia   . Memory loss   . Anxiety and depression   . History of adverse drug reaction   . Peripheral neuropathy     "both feet and legs"  . Shortness of breath 07/18/11    "alot lately"  . Anginal pain   . History of recurrent UTIs   . H/O hiatal hernia   . Anxiety   . Dementia   . Depression   . Abnormality of gait   . Thyroid nodule   . Headache(784.0) 09/05/2012  . Adenomatous polyp of colon 2002    19mm    Past Surgical History  Procedure Laterality Date  . Vesicovaginal fistula closure w/ tah    . Appendectomy    . Cholecystectomy  2000  . Mandible surgery    . Temporomandibular joint surgery  1986    Dr. Terence Lux  . Cataract extraction, bilateral    . Abdominal hysterectomy  1967  . Dental surgery      multiple tooth extractions  . Esophagogastroduodenoscopy (egd) with esophageal dilation N/A 08/23/2012    Procedure: ESOPHAGOGASTRODUODENOSCOPY (EGD) WITH ESOPHAGEAL DILATION;  Surgeon: Milus Banister, MD;  Location: [facility];  Service: Endoscopy;  Laterality: N/A;  . Colonoscopy w/ biopsies      multiple  . Nasal septum surgery  1980  . Transthoracic echocardiogram  2001  mild LVH, normal LV  . Nm myocar perf wall motion  2003    persantine - normal static and dynamic study w/apical thinning and presvered LV function, no ischemia  . Cardiac catheterization  02/17/2003    normal L main, LAD free of disease, Cfx free of disease, RCA free of disease (Dr. Rockne Menghini)    Family History  Problem Relation Age of Onset  . Heart disease Father     heart attack  . Pneumonia Mother   . Heart attack Mother   . Hypertension Mother   . Hypertension Maternal Grandmother   . Colon cancer Sister   . Kidney disease Daughter   . Asthma Daughter   . Arthritis Daughter 6    osteo,  . Heart disease Son 24    stage 3 CHF(Diastolic /Systolic)  . Throat cancer Brother     Social history:  reports that she has quit smoking. She has never used smokeless tobacco. She  reports that she does not drink alcohol or use illicit drugs.    Allergies  Allergen Reactions  . Codeine Nausea Only    unless given with Phenergan  . Doxycycline     Unknown  . Klonopin [Clonazepam]     Causes hallucination   . Meperidine Hcl Nausea Only    unless given with Phenergan  . Naproxen   . Norflex [Orphenadrine Citrate] Nausea Only    Unless given with Phenergan  . Oxycodone-Acetaminophen Nausea Only    unless given with phenergan  . Penicillins     Unknown  . Phenothiazines     Unknown  . Propoxyphene Hcl Nausea Only    unless given with phenergan  . Stelazine     Unknown  . Sulfamethoxazole-Trimethoprim     Unknown  . Tolectin [Tolmetin Sodium]     Unknown  . Tramadol     Unknown  . Zoloft [Sertraline Hcl]     Caused pt to sleep a lot  . Lubiprostone Rash    Medications:  Current Outpatient Prescriptions on File Prior to Visit  Medication Sig Dispense Refill  . aspirin EC 81 MG tablet Take 81 mg by mouth daily.    Marland Kitchen CALCIUM CARBONATE PO Take 1 tablet by mouth daily.    . carboxymethylcellulose (REFRESH TEARS) 0.5 % SOLN 1 drop 2 (two) times daily as needed (dry eyes).     Ariana [name] SOFT PO Take 1 capsule by mouth daily.    Marland Kitchen diltiazem (CARDIZEM CD) 120 MG 24 hr capsule TAKE ONE CAPSULE BY MOUTH EVERY DAY 90 capsule 1  . hydroxychloroquine (PLAQUENIL) 200 MG tablet Take 200 mg by mouth daily.     Marland Kitchen lactose free nutrition (BOOST PLUS) LIQD Take 237 mLs by mouth 2 (two) times daily between meals.  0  . lansoprazole (PREVACID) 30 MG capsule Take 1 capsule (30 mg total) by mouth daily at 12 noon. 30 capsule 5  . lisinopril (PRINIVIL,ZESTRIL) 10 MG tablet Take 1 tablet (10 mg total) by mouth daily. 30 tablet 11  . Memantine HCl ER (NAMENDA XR) 28 MG CP24 Take one tablet by mouth once daily to preserve memory 30 capsule 3  . metoprolol (LOPRESSOR) 50 MG tablet Take 1 tablet (50 mg total) by mouth 2 (two) times daily. 60 tablet 11  . nystatin (MYCOSTATIN)  100000 UNIT/ML suspension TAKE 1 TEASPOONFUL BY MOUTH 4 TIMES A DAY 60 mL 5  . pilocarpine (SALAGEN) 5 MG tablet Take 5 mg by mouth 2 (two) times daily.    Marland Kitchen  sodium chloride (OCEAN) 0.65 % SOLN nasal spray Place 1 spray into the nose as needed for congestion. 30 mL 0  . ALPRAZolam (XANAX) 0.5 MG tablet Take 1 tablet (0.5 mg total) by mouth at bedtime as needed for anxiety. 60 tablet 0  . cetirizine (ZYRTEC) 10 MG tablet Take 1 tablet (10 mg total) by mouth daily. 14 tablet 0  . HYDROcodone-acetaminophen (NORCO/VICODIN) 5-325 MG per tablet Take 1 tablet by mouth 3 (three) times daily as needed for moderate pain. MAX 3 tablets PER DAY 90 tablet 0  . pregabalin (LYRICA) 75 MG capsule Take 1 capsule (75 mg total) by mouth 2 (two) times daily. 60 capsule 3   No current facility-administered medications on file prior to visit.    ROS:  Out of a complete 14 system review of symptoms, the patient complains only of the following symptoms, and all other reviewed systems are negative.  Gait disturbance Leg pain Back pain   Blood pressure 126/72, pulse 81, height 5\' 3"  (1.6 m), weight 136 lb 6.4 oz (61.871 kg).  Physical Exam  General: The patient is alert and cooperative at the time of the examination.  Skin: No significant peripheral edema is noted.   Neurologic Exam  Mental status: The Mini-Mental Status Examination done today shows a total score 25/30.  Cranial nerves: Facial symmetry is present. Speech is normal, no aphasia or dysarthria is noted. Extraocular movements are full. Visual fields are full.  Motor: The patient has good strength in all 4 extremities.  Sensory examination: Soft touch sensation is symmetric on the face and arms, decreased on the left leg relative to the right.  Coordination: The patient has good finger-nose-finger and heel-to-shin bilaterally.  Gait and station: The patient has a slightly wide-based, unsteady gait. The patient uses a cane or a walker for  ambulation. Tandem gait was not attempted. Romberg is negative. No drift is seen.  Reflexes: Deep tendon reflexes are symmetric.   Assessment/Plan:  1. Peripheral neuropathy  2. Gait disturbance  3. Memory disturbance  The patient will be increased on this Cymbalta taking 30 mg in the morning and 60 mg in evening. Home health physical therapy will be ordered again for gait training, as the patient continues to be a fall risk. She will follow-up through this office in about 6 months.  Jill Alexanders MD 03/23/2014 9:00 PM  [facility] [address] [place] [place], [place] [address]  Phone [phone] Fax [phone]

## 2014-04-13 ENCOUNTER — Other Ambulatory Visit: Payer: Self-pay | Admitting: Internal Medicine

## 2014-04-13 ENCOUNTER — Telehealth: Payer: Self-pay | Admitting: Internal Medicine

## 2014-04-13 NOTE — Telephone Encounter (Signed)
Patient with constipation and worsening abdominal pain. Her daughter reports that she is taking Miralax, Senna, and "Smooth Move" tea and this helps with the constipation.  Her mother c/o worsening abdominal pain for the last few weeks.  She will come in and see Tye Savoy RNP tomorrow at 2:00

## 2014-04-13 NOTE — Telephone Encounter (Signed)
Spoke with [employer] was filled and refills on file

## 2014-04-14 ENCOUNTER — Ambulatory Visit (INDEPENDENT_AMBULATORY_CARE_PROVIDER_SITE_OTHER): Payer: Medicare Other | Admitting: Nurse Practitioner

## 2014-04-14 ENCOUNTER — Encounter: Payer: Self-pay | Admitting: Nurse Practitioner

## 2014-04-14 VITALS — BP 120/60 | HR 64 | Ht 63.0 in | Wt 136.0 lb

## 2014-04-14 DIAGNOSIS — K5900 Constipation, unspecified: Secondary | ICD-10-CM

## 2014-04-14 NOTE — Patient Instructions (Signed)
Continue Smooth Move Tea for 3 days then stop.  Start Dulcolax suppositories every morning.  Continue daily fiber and Miralax.  Please call u s in 2 weeks with a condition update. You can ask for Chanda Busing nurse. [phone]. I f you can't reach her, ask for Ariel Wingrove at the same number.

## 2014-04-14 NOTE — Progress Notes (Signed)
     History of Present Illness:  Patient is an 81 year old female known to Dr. Carlean Purl. She has a history of irritable bowel syndrome. She had a colonoscopy April 2010 for evaluation of bowel changes. Extent of the exam was to the cecum. The prep was excellent. Moderate diverticulosis, melanosis coli  and external hemorrhoids were found, exam otherwise normal. She was tried on Movantik but it was discontinued after developing a generalized rash.   Upon our recommendations patient has been taking fiber and Miralax with no improvement in bowel habits. She complains of excessive bloating from constipation. She takes Norco TID as well as other constipating medications. Bowels will not move without manual manipulation.  She drank "smooth move" tea a few times and had a nice response.   Current Medications, Allergies, Past Medical History, Past Surgical History, Family History and Social History were reviewed in [employer] record.  Physical Exam: General: Pleasant, well developed , white female in no acute distress Head: Normocephalic and atraumatic Eyes:  sclerae anicteric, conjunctiva pink  Ears: Normal auditory acuity Lungs: Clear throughout to auscultation Heart: Regular rate and rhythm Abdomen: Soft, non distended, non-tender. No masses, no hepatomegaly. Normal bowel sounds Rectal: no appreciable pelvic descent on rectal exam. Not impacted, no stool in vault Musculoskeletal: Symmetrical with no gross deformities  Extremities: No edema  Neurological: Alert oriented x 4, grossly nonfocal Psychological:  Alert and cooperative. Normal mood and affect  Assessment and Recommendations:   81 year old female with chronic constipation. She may have slow transit but suspect decreased pelvic floor muscle strength also contributing factor. Continue fiber and miralax. Will add daily dulcolax suppositories to help stimulate /empty the rectum. Chronic constipation is compounded by  her use of Norco TID. Patient will call if no improvement with above.

## 2014-04-15 ENCOUNTER — Encounter: Payer: Self-pay | Admitting: Nurse Practitioner

## 2014-04-15 DIAGNOSIS — K5900 Constipation, unspecified: Secondary | ICD-10-CM | POA: Insufficient documentation

## 2014-04-15 NOTE — Progress Notes (Signed)
Agree with Ms. Guenther's assessment and plan. Carl E. Gessner, MD, FACG   

## 2014-04-16 ENCOUNTER — Ambulatory Visit: Payer: Medicare Other | Admitting: Internal Medicine

## 2014-04-20 ENCOUNTER — Ambulatory Visit: Payer: Medicare Other | Admitting: Neurology

## 2014-04-24 ENCOUNTER — Other Ambulatory Visit: Payer: Self-pay | Admitting: *Deleted

## 2014-04-24 DIAGNOSIS — IMO0001 Reserved for inherently not codable concepts without codable children: Secondary | ICD-10-CM

## 2014-04-24 MED ORDER — HYDROCODONE-ACETAMINOPHEN 5-325 MG PO TABS: 1.0000 | ORAL_TABLET | Freq: Three times a day (TID) | ORAL | Status: DC | PRN

## 2014-04-24 NOTE — Telephone Encounter (Signed)
Daughter, Pia requested and will pick up. To schedule follow up appointment.

## 2014-04-30 ENCOUNTER — Telehealth: Payer: Self-pay | Admitting: *Deleted

## 2014-04-30 DIAGNOSIS — F4323 Adjustment disorder with mixed anxiety and depressed mood: Secondary | ICD-10-CM

## 2014-04-30 MED ORDER — ALPRAZOLAM 0.5 MG PO TABS: 0.5000 mg | ORAL_TABLET | Freq: Every evening | ORAL | Status: DC | PRN

## 2014-04-30 MED ORDER — DULOXETINE HCL 30 MG PO CPEP: ORAL_CAPSULE | ORAL | Status: DC

## 2014-04-30 MED ORDER — DULOXETINE HCL 60 MG PO CPEP: ORAL_CAPSULE | ORAL | Status: DC

## 2014-04-30 NOTE — Telephone Encounter (Signed)
Patient daughter Notified and Rx's faxed to pharmacy.

## 2014-04-30 NOTE — Telephone Encounter (Signed)
Patient daughter called and stated that she needs a Rx for Cymbalta 60mg  one daily and a Rx for Cymbalta 30mg  one daily to total 90mg . Dr. Jannifer Franklin suggested due to insurance will not pay for her to take Cymbalta 30mg  three times daily. Also daughter states that [name] told her to give her mother Alprazolam twice daily due to patient "climbing the walls" but in Dr. Cyndi Lennert last [place] note it states one at bedtime, but daughter wants two a day.  Please Advise.

## 2014-04-30 NOTE — Telephone Encounter (Signed)
With xanax i would suggest one tablet at bedtime as per dr reed's note With cymbalta script for 60 mg once a day and 30 mg once a day for total 90 mg daily (each 30 days supply) ok to provide for now

## 2014-05-11 ENCOUNTER — Other Ambulatory Visit: Payer: Self-pay | Admitting: Internal Medicine

## 2014-05-11 ENCOUNTER — Telehealth: Payer: Self-pay

## 2014-05-11 MED ORDER — MIRABEGRON ER 50 MG PO TB24: 50.0000 mg | ORAL_TABLET | Freq: Every day | ORAL | Status: DC

## 2014-05-11 NOTE — Telephone Encounter (Signed)
Daughter called asking for Myrbetriq call to [facility] . Done, called daughter

## 2014-05-13 ENCOUNTER — Telehealth: Payer: Self-pay | Admitting: Nurse Practitioner

## 2014-05-13 NOTE — Telephone Encounter (Signed)
Patient is having BM daily with Miralax and suppositories, but still having right sided abdominal pain and bloating with her meals.  She will come in and see Tye Savoy RNP on 05/15/14 2:30

## 2014-05-15 ENCOUNTER — Ambulatory Visit (INDEPENDENT_AMBULATORY_CARE_PROVIDER_SITE_OTHER): Payer: Medicare Other | Admitting: Nurse Practitioner

## 2014-05-15 ENCOUNTER — Ambulatory Visit (INDEPENDENT_AMBULATORY_CARE_PROVIDER_SITE_OTHER)
Admission: RE | Admit: 2014-05-15 | Discharge: 2014-05-15 | Disposition: A | Discharge status: Home / Self Care | Payer: Medicare Other | Source: Ambulatory Visit | Attending: Nurse Practitioner | Admitting: Nurse Practitioner

## 2014-05-15 ENCOUNTER — Encounter: Payer: Self-pay | Admitting: Nurse Practitioner

## 2014-05-15 VITALS — BP 120/70 | HR 72 | Ht 63.0 in | Wt 138.2 lb

## 2014-05-15 DIAGNOSIS — K5900 Constipation, unspecified: Secondary | ICD-10-CM | POA: Diagnosis not present

## 2014-05-15 DIAGNOSIS — K589 Irritable bowel syndrome without diarrhea: Secondary | ICD-10-CM

## 2014-05-15 NOTE — Progress Notes (Signed)
     History of Present Illness:  Patient is an 81 year old female known to Dr. Carlean Purl. She has a history of irritable bowel syndrome. Patient is on multiple medications, she has multiple allergies. I saw her the beginning of March for chronic constipation. Her fiber and MiraLAX were continued, Dulcolax suppositories added. Patient on chronic narcotics felt to be compounding her problems with constipation.  Patient returns for ongoing constipation. She is having very few BMs consisting of small stools. She is taking miralax, fiber and using dulcolax suppositories as recommended. She has RLQ discomfort and reports RLQ distention. She is unsure is pain would be alleviated by resolution of constipation. No nausea. She denies unexpected weight loss  Current Medications, Allergies, Past Medical History, Past Surgical History, Family History and Social History were reviewed in [employer] record.   Physical Exam: General: Pleasant, well developed , white female in no acute distress Head: Normocephalic and atraumatic Eyes:  sclerae anicteric, conjunctiva pink  Ears: Normal auditory acuity Lungs: Clear throughout to auscultation Heart: Regular rate and rhythm Abdomen: Soft, mild diffuse tenderness. RLQ is moderately distended and tympanitic with mild tenderness. No discrete masses.  Normal bowel sounds Rectal: no masses felt. No stool in vault Musculoskeletal: Symmetrical with no gross deformities  Extremities: No edema  Neurological: Alert oriented x 4, grossly nonfocal Psychological:  Alert and cooperative. Normal mood and affect  Assessment and Recommendations:    81 year old female with IBS / chronic constipation refractory to fiber, miralax and dulcolax suppositories. She is distended in RLQ but has nonobstructve bowel sounds and no nausea / vomiting.   Get kub.   Will get labs to check renal function in case CTscan needed.   Continue present bowel regimen.  Await KUB, depending on results will consider trial of Amitiza   Unfortunately she needs to continue pain meds.

## 2014-05-15 NOTE — Patient Instructions (Signed)
Please go to the basement level to our radiology department for an X-ray. We will call you with results. It may be Monday.  Keep taking Miralax 17 gram in 8 oz of water.  We made you an appointment with Dr. Silvano Rusk for 07-08-2014 at 2:00 PM

## 2014-05-18 ENCOUNTER — Telehealth: Payer: Self-pay

## 2014-05-18 MED ORDER — LUBIPROSTONE 8 MCG PO CAPS: 8.0000 ug | ORAL_CAPSULE | Freq: Two times a day (BID) | ORAL | Status: DC

## 2014-05-18 NOTE — Telephone Encounter (Signed)
-----   Message from Willia Craze, NP sent at 05/18/2014  4:00 PM EDT ----- Barbera Setters, will you call patient and let her know that there was no obstructive process on KUB. Can we try her on Amitia 96mcg BID with food. I would like to see her back in a couple of weeks if you can get her on with me. She should continue fiber. Continue daily Miralax until having BMs on Amitiza then can stop the miralax. Thanks

## 2014-05-18 NOTE — Telephone Encounter (Signed)
Daughter notified and rx sent.  Follow up with Tye Savoy RNP scheduled for 06/04/14

## 2014-05-27 ENCOUNTER — Other Ambulatory Visit: Payer: Self-pay

## 2014-05-27 DIAGNOSIS — IMO0001 Reserved for inherently not codable concepts without codable children: Secondary | ICD-10-CM

## 2014-05-27 MED ORDER — HYDROCODONE-ACETAMINOPHEN 5-325 MG PO TABS: 1.0000 | ORAL_TABLET | Freq: Three times a day (TID) | ORAL | Status: DC | PRN

## 2014-05-28 NOTE — Progress Notes (Signed)
Agree with Ms. Guenther's assessment and plan. Carl E. Gessner, MD, FACG   

## 2014-06-02 ENCOUNTER — Telehealth: Payer: Self-pay | Admitting: *Deleted

## 2014-06-02 DIAGNOSIS — F4323 Adjustment disorder with mixed anxiety and depressed mood: Secondary | ICD-10-CM

## 2014-06-02 NOTE — Telephone Encounter (Signed)
Ok, I am not understanding why she is only calling again now if she was giving two a day since 3/17 instead of one a day.  I apologize that the medication was listed as daily b/c that's what we had tried to reduce it to in the past and somehow it got put in that way.  I see no evidence that she has called sooner concerned about her mom's condition or b/c she was out of medication. We have discussed that xanax is not good for her to be on long term.  Ok to give 60 xanax pills this time.

## 2014-06-02 NOTE — Telephone Encounter (Signed)
Ariana [name], Daughter called and would like for [name] to call her concerning some medication. Wants to speak with [name]. Please call # [phone]

## 2014-06-02 NOTE — Telephone Encounter (Signed)
I generally get the issue that the patient wants to discuss but the daughter did not want to explain it twice.Marland KitchenMarland KitchenMarland KitchenIt is about the Alprazolam, she stated that at the last appointment  you told her that no one could tell you anything about the prior approval for the Alprazolam and you resolved it on the phone with the insurance company while they were here and Then you examined her mother and she stated that you were pretty good about changing things and letting them know about the changes and that day you never told her you were changing anything. Very Upset that you didn't discuss this with her. Stated that you told her to take 2 a day---one in the morning and one at bedtime and you didn't change it in the medication list nor in your notes and then went on vacation, so she has been giving 2 a day and now has ran out. Patient became horrible to everyone. Upset that this wasn't changed. In your OV Note and medication list it specifically states take one at bedtime as needed for anxiety. But daughter states you told her differently and upset because her mother has done without.

## 2014-06-02 NOTE — Telephone Encounter (Signed)
Can we find out at least which medication we are referring to and generally what the issue is so I can be prepared?  The patient's appt is 4/29.

## 2014-06-03 MED ORDER — ALPRAZOLAM 0.5 MG PO TABS: ORAL_TABLET | ORAL | Status: DC

## 2014-06-03 NOTE — Telephone Encounter (Signed)
Patient daughter Notified and phoned in Rx to pharmacy. Spoke with pharmacist.

## 2014-06-04 ENCOUNTER — Ambulatory Visit: Payer: Medicare Other | Admitting: Nurse Practitioner

## 2014-06-10 ENCOUNTER — Telehealth: Payer: Self-pay | Admitting: *Deleted

## 2014-06-10 NOTE — Telephone Encounter (Signed)
Spoke to daughter Salimah. I told her we are trying to get this Amitiza approved for her mother. I got a form from [facility] and I was told it was the wrong form from them.  Genella Mech CMA will go on line with Cover my meds tomorrow 06-11-2014 in my abscence. ( Im going out of town). We will try to get the Amitiza 8 mcg approved for the patient.

## 2014-06-11 ENCOUNTER — Telehealth: Payer: Self-pay

## 2014-06-11 NOTE — Telephone Encounter (Signed)
Daughter Carlisha notified of the approval and also [facility] notified.

## 2014-06-11 NOTE — Telephone Encounter (Signed)
-----   Message from Kathline Magic sent at 06/11/2014  8:36 AM EDT ----- Tomi Bamberger at [facility] is calling to tell you that the pt's Amitiza was approved for dates 06/11/14 - 06/11/15.

## 2014-06-12 ENCOUNTER — Encounter: Payer: Self-pay | Admitting: Internal Medicine

## 2014-06-12 ENCOUNTER — Ambulatory Visit (INDEPENDENT_AMBULATORY_CARE_PROVIDER_SITE_OTHER): Payer: Medicare Other | Admitting: Internal Medicine

## 2014-06-12 ENCOUNTER — Other Ambulatory Visit: Payer: Self-pay | Admitting: Nurse Practitioner

## 2014-06-12 VITALS — BP 128/76 | HR 67 | Temp 97.9°F | Ht 63.0 in | Wt 140.0 lb

## 2014-06-12 DIAGNOSIS — Z23 Encounter for immunization: Secondary | ICD-10-CM

## 2014-06-12 DIAGNOSIS — F341 Dysthymic disorder: Secondary | ICD-10-CM | POA: Diagnosis not present

## 2014-06-12 DIAGNOSIS — K5909 Other constipation: Secondary | ICD-10-CM | POA: Diagnosis not present

## 2014-06-12 DIAGNOSIS — T402X5A Adverse effect of other opioids, initial encounter: Secondary | ICD-10-CM | POA: Diagnosis not present

## 2014-06-12 DIAGNOSIS — N3946 Mixed incontinence: Secondary | ICD-10-CM

## 2014-06-12 DIAGNOSIS — K5903 Drug induced constipation: Secondary | ICD-10-CM

## 2014-06-12 DIAGNOSIS — R54 Age-related physical debility: Secondary | ICD-10-CM | POA: Diagnosis not present

## 2014-06-12 DIAGNOSIS — Z79899 Other long term (current) drug therapy: Secondary | ICD-10-CM

## 2014-06-12 DIAGNOSIS — F03918 Unspecified dementia, unspecified severity, with other behavioral disturbance: Secondary | ICD-10-CM

## 2014-06-12 DIAGNOSIS — G629 Polyneuropathy, unspecified: Secondary | ICD-10-CM | POA: Diagnosis not present

## 2014-06-12 DIAGNOSIS — M545 Low back pain, unspecified: Secondary | ICD-10-CM

## 2014-06-12 DIAGNOSIS — F0391 Unspecified dementia with behavioral disturbance: Secondary | ICD-10-CM | POA: Diagnosis not present

## 2014-06-12 MED ORDER — DULOXETINE HCL 60 MG PO CPEP: ORAL_CAPSULE | ORAL | Status: DC

## 2014-06-12 MED ORDER — MEMANTINE HCL ER 28 MG PO CP24: ORAL_CAPSULE | ORAL | Status: DC

## 2014-06-12 MED ORDER — DULOXETINE HCL 30 MG PO CPEP: ORAL_CAPSULE | ORAL | Status: DC

## 2014-06-12 NOTE — Progress Notes (Signed)
Patient ID: Ariana [name], female   DOB: 1929/09/19, 81 y.o.   MRN: [id number]   Location:  [facility]  Code Status: full code Goals of Care: Advanced Directive information Does patient have an advance directive?: Yes, Type of Advance Directive: [place], Does patient want to make changes to advanced directive?: No - Patient declined   Allergies  Allergen Reactions  . Codeine Nausea Only    unless given with Phenergan  . Doxycycline     Unknown  . Klonopin [Clonazepam]     Causes hallucination   . Meperidine Hcl Nausea Only    unless given with Phenergan  . Naproxen   . Norflex [Orphenadrine Citrate] Nausea Only    Unless given with Phenergan  . Oxycodone-Acetaminophen Nausea Only    unless given with phenergan  . Penicillins     Unknown  . Phenothiazines     Unknown  . Propoxyphene Hcl Nausea Only    unless given with phenergan  . Stelazine     Unknown  . Sulfamethoxazole-Trimethoprim     Unknown  . Tolectin [Tolmetin Sodium]     Unknown  . Tramadol     Unknown  . Zoloft [Sertraline Hcl]     Caused pt to sleep a lot  . Lubiprostone Rash    Chief Complaint  Patient presents with  . Follow-up    6 week follow-up, no recent labs   . Hand Problem    Examine left hand, injured x 1 month ago     HPI: Patient is a 81 y.o. white female seen in the office today for 6 wk f/u.    Since her last appt, she injured her left hand.  She was getting out of bed and fell and hit nightstand 3-4 wks ago.  Hand was swollen and black and blue.  Pt's son in law came to check on her and put ice on it.  Still hurts now.  Recommended use of heat as no injury visible and she's able to use her hand.  Her daughter was very upset that her xanax dose was not modified in her medication list to twice a day from once a day and says her mother went without medication as a result.  There was no call documented to our office when she  ran out early, only several weeks later.    Had fall after watering two of the three azaleas.  Says if she steps on the left foot on her toes before her heel, she will fall and she doesn't know why.  Has minimal sensation in the feet.   To see Dr. Carlean Purl and finally got approval for amitiza bid with food for her constipation.  Xray was unremarkable.   When sits, only a little urine comes out and if pulls feet under and leans forward, pieces of stool come out in small chunks. Is taking the fiber and the miralax also.  After starting amitiza, to stop miralax on day 2.    Has f/u upcoming in august with Dr. Fredderick Phenix re: rash.  To get the sitz baths, but does not always.  Had a lot of swelling of her arms and back.  Had it on abdomen when here last time.  Says she is only taking her hydrocodone now bid instead of tid.   Has rosacea that is worse in the am.  Review of Systems:  Review of Systems  Constitutional: Positive for malaise/fatigue. Negative for  fever and chills.  HENT: Negative for congestion.   Eyes: Positive for blurred vision.  Respiratory: Negative for shortness of breath.   Cardiovascular: Negative for chest pain.  Gastrointestinal: Positive for abdominal pain and constipation. Negative for diarrhea, blood in stool and melena.  Genitourinary: Negative for dysuria.  Musculoskeletal: Positive for falls.  Neurological: Positive for weakness. Negative for dizziness.  Endo/Heme/Allergies: Bruises/bleeds easily.  Psychiatric/Behavioral: Positive for depression and memory loss. The patient is nervous/anxious.     Past Medical History  Diagnosis Date  . Sjogren's syndrome   . Dry eye syndrome   . Hypertension, benign   . Mitral valve prolapse   . GERD (gastroesophageal reflux disease)   . Diverticulosis of colon   . Irritable bowel syndrome   . Urinary incontinence   . Low back pain syndrome   . Fibromyalgia   . Memory loss   . Anxiety and depression   . History of  adverse drug reaction   . Peripheral neuropathy     "both feet and legs"  . Shortness of breath 07/18/11    "alot lately"  . Anginal pain   . History of recurrent UTIs   . H/O hiatal hernia   . Anxiety   . Dementia   . Depression   . Abnormality of gait   . Thyroid nodule   . Headache(784.0) 09/05/2012  . Adenomatous polyp of colon 2002    35mm    Past Surgical History  Procedure Laterality Date  . Vesicovaginal fistula closure w/ tah    . Appendectomy    . Cholecystectomy  2000  . Mandible surgery    . Temporomandibular joint surgery  1986    Dr. Terence Lux  . Cataract extraction, bilateral    . Abdominal hysterectomy  1967  . Dental surgery      multiple tooth extractions  . Esophagogastroduodenoscopy (egd) with esophageal dilation N/A 08/23/2012    Procedure: ESOPHAGOGASTRODUODENOSCOPY (EGD) WITH ESOPHAGEAL DILATION;  Surgeon: Milus Banister, MD;  Location: [facility];  Service: Endoscopy;  Laterality: N/A;  . Colonoscopy w/ biopsies      multiple  . Nasal septum surgery  1980  . Transthoracic echocardiogram  2001    mild LVH, normal LV  . Nm myocar perf wall motion  2003    persantine - normal static and dynamic study w/apical thinning and presvered LV function, no ischemia  . Cardiac catheterization  02/17/2003    normal L main, LAD free of disease, Cfx free of disease, RCA free of disease (Dr. Rockne Menghini)    Social History:   reports that she has quit smoking. She has never used smokeless tobacco. She reports that she does not drink alcohol or use illicit drugs.  Family History  Problem Relation Age of Onset  . Heart disease Father     heart attack  . Pneumonia Mother   . Heart attack Mother   . Hypertension Mother   . Hypertension Maternal Grandmother   . Colon cancer Sister   . Kidney disease Daughter   . Asthma Daughter   . Arthritis Daughter 45    osteo,  . Heart disease Son 71    stage 3 CHF(Diastolic /Systolic)  . Throat cancer Brother      Medications: Patient's Medications  New Prescriptions   No medications on file  Previous Medications   ALPRAZOLAM (XANAX) 0.5 MG TABLET    Take one tablet by mouth twice daily as needed for anxiety   ASPIRIN EC 81 MG  TABLET    Take 81 mg by mouth daily.   CALCIUM CARBONATE PO    Take 1 tablet by mouth daily.   CARBOXYMETHYLCELLULOSE (REFRESH TEARS) 0.5 % SOLN    1 drop 2 (two) times daily as needed (dry eyes).    CETIRIZINE (ZYRTEC) 10 MG TABLET    Take 1 tablet (10 mg total) by mouth daily.   CRANBERRY SOFT PO    Take 1 capsule by mouth daily.   DILTIAZEM (CARDIZEM CD) 120 MG 24 HR CAPSULE    TAKE ONE CAPSULE BY MOUTH EVERY DAY   HYDROCODONE-ACETAMINOPHEN (NORCO/VICODIN) 5-325 MG PER TABLET    Take 1 tablet by mouth 3 (three) times daily as needed for moderate pain. MAX 3 tablets PER DAY   HYDROXYCHLOROQUINE (PLAQUENIL) 200 MG TABLET    Take 200 mg by mouth daily.    LACTOSE FREE NUTRITION (BOOST PLUS) LIQD    Take 237 mLs by mouth 2 (two) times daily between meals.   LANSOPRAZOLE (PREVACID) 30 MG CAPSULE    Take 1 capsule (30 mg total) by mouth daily at 12 noon.   LISINOPRIL (PRINIVIL,ZESTRIL) 10 MG TABLET    Take 1 tablet (10 mg total) by mouth daily.   LUBIPROSTONE (AMITIZA) 8 MCG CAPSULE    Take 1 capsule (8 mcg total) by mouth 2 (two) times daily with a meal.   MEMANTINE HCL ER (NAMENDA XR) 28 MG CP24    Take one tablet by mouth once daily to preserve memory   METOPROLOL (LOPRESSOR) 50 MG TABLET    Take 1 tablet (50 mg total) by mouth 2 (two) times daily.   MIRABEGRON ER (MYRBETRIQ) 50 MG TB24 TABLET    Take 1 tablet (50 mg total) by mouth daily. Take one tablet daily for bladder   NYSTATIN (MYCOSTATIN) 100000 UNIT/ML SUSPENSION    TAKE 1 TEASPOONFUL BY MOUTH 4 TIMES A DAY   PILOCARPINE (SALAGEN) 5 MG TABLET    Take 5 mg by mouth 2 (two) times daily.   PREGABALIN (LYRICA) 75 MG CAPSULE    Take 1 capsule (75 mg total) by mouth 2 (two) times daily.   SODIUM CHLORIDE (OCEAN) 0.65 %  SOLN NASAL SPRAY    Place 1 spray into the nose as needed for congestion.   TRIAMCINOLONE OINTMENT (KENALOG) 0.1 %      Modified Medications   Modified Medication Previous Medication   DULOXETINE (CYMBALTA) 30 MG CAPSULE DULoxetine (CYMBALTA) 30 MG capsule      Take one tablet  once daily in the morning for depression    Take one tablet along with 60mg  once daily for depression   DULOXETINE (CYMBALTA) 60 MG CAPSULE DULoxetine (CYMBALTA) 60 MG capsule      Take one tablet by mouth in the evening once daily for depression    Take one tablet by mouth along with 30mg  once daily for depression  Discontinued Medications   No medications on file     Physical Exam: Filed Vitals:   06/12/14 1137  BP: 128/76  Pulse: 67  Temp: 97.9 F (36.6 C)  TempSrc: Oral  Weight: 140 lb (63.504 kg)  SpO2: 95%  Physical Exam  Constitutional:  Frail white female, walks unsteadily with cane  HENT:  Head: Normocephalic and atraumatic.  Cardiovascular: Normal rate, regular rhythm, normal heart sounds and intact distal pulses.   Pulmonary/Chest: Effort normal and breath sounds normal. No respiratory distress.  Abdominal: Soft. Bowel sounds are normal.  Neurological: She is alert.  Loss of  sensation to light touch on plantar surface of feet near toes; she is oriented to person and place, but very confused about history, retells stories frequently and asks the same questions of her daughter many times  Skin: Skin is warm and dry.  Psychiatric:  Easily agitated, tangential pressured speech, poor judgment and lack of insight into her memory loss     Labs reviewed: Basic Metabolic Panel:  Recent Labs  07/20/13 2017  07/22/13 0517 07/30/13 1400 01/12/14 1452  NA  --   < > 141 134* 140  K  --   < > 3.6* 5.5* 4.6  CL  --   < > 106 100 98  CO2  --   < > 25 28 28   GLUCOSE  --   < > 104* 87 74  BUN  --   < > 12 24* 20  CREATININE  --   < > 0.64 0.82 0.72  CALCIUM  --   < > 9.4 10.1 9.7  MG 1.9  --    --   --   --   < > = values in this interval not displayed. Liver Function Tests:  Recent Labs  07/20/13 1630 07/30/13 1400  AST 46* 23  ALT 57* 23  ALKPHOS 96 83  BILITOT 0.7 0.5  PROT 7.2 7.1  ALBUMIN 4.1 4.5    Recent Labs  07/20/13 1630  LIPASE 41   No results for input(s): AMMONIA in the last 8760 hours. CBC:  Recent Labs  07/20/13 1630 07/22/13 0517 07/30/13 1402  WBC 6.5 6.8 7.5  NEUTROABS 3.7  --   --   HGB 12.9 13.0 13.4  HCT 37.7 38.2 42.1  MCV 90.8 89.5 95.8  PLT 192 213  --    Lab Results  Component Value Date   HGBA1C 5.7 05/16/2012   Assessment/Plan 1. High risk medication use - I have concerns that pt is not using her meds properly--pt's son in law fills pill boxes for her due to her dementia -her daughter has a lot of low back pain issues -pt's pain remains poorly controlled, but increases will only make her more unsteady and she still lives alone, but does have help most of the time -her daughter notes they are running out of money so samples of meds and coupons provided today as best we could come up with for her -lyrica may have helped, also, but concerns about increasing dose leading to sedation, more falls - Drugs of abuse scrn w alc, routine urine - Hydrocodone + Metabolites, U  2. ANXIETY DEPRESSION -also poorly controlled -pt refuses to return to psychiatry -cymbalta did offer some help to pt both with pain and mood, but seems she is bipolar and needs counseling, as well -my attempts to wean her xanax have failed miserably  (done due to fall history and risk of OD in combination with her hydrocodone)--again UDS was ordered today to assess if she is truly receiving her meds  3. Peripheral neuropathy -cont lyrica, cymbalta, careful walking--won't use walker as recommended  4. Low back pain associated with a spinal disorder other than radiculopathy or spinal stenosis -cont lyrica, cymbalta, hydrocodone bid, but seems she truly needs  better pain control -await UDS results--would consider lowest dose hyslingla for her if she is getting her hydrocodone  5. Therapeutic opioid induced constipation -hopefully, she'll take her bowel regimen as recommended by Dr. Malena Catholic has never done what she was directed to do for more than two days  in the past so has continued to have serious constipation problems -seemed that the latest med for opioid induced constipation was effective, but cost was prohibitive  6. Mixed stress and urge urinary incontinence -continues on myrbetriq with benefit  7. Frailty -had a discussion with her daughter at the end of the visit about how I am concerned that she is not going to be able to stay at home much longer due to her dementia worsening--sounds like she still tries to manage some of her own house-related problems and it does not go smoothly -she has a caregiver much of the time -I also have concerns she will break a bone, and we need to get a copy of the bone density that her gynecologist orders for her--she is only on calcium as far as I can tell and, if it is entered properly, it does not even have vitamin D in it - CBC with Differential/Platelet - Comprehensive metabolic panel  8. Need for vaccination with 13-polyvalent pneumococcal conjugate vaccine -prevnar was given  9. Senile dementia, with behavioral disturbance - 90 day supply free coupon was provided today attached to prescription: - memantine (NAMENDA XR) 28 MG CP24 24 hr capsule; Take one tablet by mouth once daily to preserve memory  Dispense: 90 capsule; Refill: 3  Labs/tests ordered:   Orders Placed This Encounter  Procedures  . Drugs of abuse scrn w alc, routine urine  . Hydrocodone + Metabolites, U  . CBC with Differential/Platelet  . Comprehensive metabolic panel  prevnar  More than 40 mins were spent with pt and her daughter today  Next appt:  3 mos  Izeah Vossler L. Dempsey Ahonen, D.O. [facility] [address], [place] [address] Cell Phone (Mon-Fri 8am-5pm):  [phone] On Call:  [phone] & follow prompts after 5pm & weekends Office Phone:  [phone] Office Fax:  [phone]

## 2014-06-13 LAB — CBC WITH DIFFERENTIAL/PLATELET
Basophils Absolute: 0 10*3/uL (ref 0.0–0.2)
Basos: 0 %
EOS (ABSOLUTE): 0.2 10*3/uL (ref 0.0–0.4)
Eos: 3 %
Hematocrit: 39.3 % (ref 34.0–46.6)
Hemoglobin: 12.8 g/dL (ref 11.1–15.9)
Immature Grans (Abs): 0 10*3/uL (ref 0.0–0.1)
Immature Granulocytes: 0 %
Lymphocytes Absolute: 1.2 10*3/uL (ref 0.7–3.1)
Lymphs: 21 %
MCH: 30.1 pg (ref 26.6–33.0)
MCHC: 32.6 g/dL (ref 31.5–35.7)
MCV: 93 fL (ref 79–97)
Monocytes Absolute: 0.5 10*3/uL (ref 0.1–0.9)
Monocytes: 8 %
Neutrophils Absolute: 4 10*3/uL (ref 1.4–7.0)
Neutrophils: 68 %
Platelets: 214 10*3/uL (ref 150–379)
RBC: 4.25 x10E6/uL (ref 3.77–5.28)
RDW: 12.7 % (ref 12.3–15.4)
WBC: 5.9 10*3/uL (ref 3.4–10.8)

## 2014-06-13 LAB — COMPREHENSIVE METABOLIC PANEL
ALT: 22 IU/L (ref 0–32)
AST: 26 IU/L (ref 0–40)
Albumin/Globulin Ratio: 1.7 (ref 1.1–2.5)
Albumin: 4.3 g/dL (ref 3.5–4.7)
Alkaline Phosphatase: 92 IU/L (ref 39–117)
BUN/Creatinine Ratio: 26 (ref 11–26)
BUN: 18 mg/dL (ref 8–27)
Bilirubin Total: <0.2 mg/dL (ref 0.0–1.2)
CO2: 25 mmol/L (ref 18–29)
Calcium: 9.4 mg/dL (ref 8.7–10.3)
Chloride: 101 mmol/L (ref 97–108)
Creatinine, Ser: 0.68 mg/dL (ref 0.57–1.00)
GFR calc Af Amer: 94 mL/min/{1.73_m2} (ref >59)
GFR calc non Af Amer: 82 mL/min/{1.73_m2} (ref >59)
Globulin, Total: 2.6 g/dL (ref 1.5–4.5)
Glucose: 131 mg/dL — ABNORMAL HIGH (ref 65–99)
Potassium: 4.4 mmol/L (ref 3.5–5.2)
Sodium: 142 mmol/L (ref 134–144)
Total Protein: 6.9 g/dL (ref 6.0–8.5)

## 2014-06-16 ENCOUNTER — Other Ambulatory Visit: Payer: Self-pay | Admitting: Obstetrics & Gynecology

## 2014-06-17 LAB — CYTOLOGY - PAP

## 2014-06-17 LAB — URINE DRUGS OF ABUSE SCREEN W ALC, ROUTINE (REF LAB)
Amphetamines, Urine: NEGATIVE ng/mL
Barbiturate Quant, Ur: NEGATIVE ng/mL
Cannabinoid Quant, Ur: NEGATIVE ng/mL
Cocaine (Metab.): NEGATIVE ng/mL
Ethanol U, Quan: NEGATIVE %
Methadone Screen, Urine: NEGATIVE ng/mL
PCP Quant, Ur: NEGATIVE ng/mL
Propoxyphene: NEGATIVE ng/mL

## 2014-06-17 LAB — OPIATES CONFIRMATION, URINE: OPIATES: NEGATIVE

## 2014-06-17 LAB — DRUG PROFILE 799031
BENZODIAZEPINES: POSITIVE — AB
HYDROXYALPRAZOLAM: POSITIVE — AB
NORDIAZEPAM: NEGATIVE
OH-Alprazolam GC/MS Conf: 422 ng/mL
Oxazepam: NEGATIVE

## 2014-06-17 LAB — HYDROCODONE + METABOLITES, U
Hydrocodone Ur-Mcnc: 1250 ng/mL
Hydromorphone Ur-Mcnc: 66 ng/mL

## 2014-06-24 ENCOUNTER — Ambulatory Visit: Payer: Medicare Other | Admitting: Neurology

## 2014-06-29 ENCOUNTER — Other Ambulatory Visit: Payer: Self-pay | Admitting: Internal Medicine

## 2014-06-29 NOTE — Telephone Encounter (Signed)
Rx(s) sent to pharmacy electronically.  

## 2014-07-02 ENCOUNTER — Other Ambulatory Visit: Payer: Self-pay | Admitting: *Deleted

## 2014-07-02 DIAGNOSIS — IMO0001 Reserved for inherently not codable concepts without codable children: Secondary | ICD-10-CM

## 2014-07-02 MED ORDER — HYDROCODONE-ACETAMINOPHEN 5-325 MG PO TABS: 1.0000 | ORAL_TABLET | Freq: Three times a day (TID) | ORAL | Status: DC | PRN

## 2014-07-02 NOTE — Telephone Encounter (Signed)
Patient daughter Requested and will pick up

## 2014-07-03 ENCOUNTER — Ambulatory Visit (INDEPENDENT_AMBULATORY_CARE_PROVIDER_SITE_OTHER): Payer: Medicare Other | Admitting: Internal Medicine

## 2014-07-03 ENCOUNTER — Encounter: Payer: Self-pay | Admitting: Internal Medicine

## 2014-07-03 VITALS — BP 90/70 | HR 123 | Ht 63.5 in | Wt 142.5 lb

## 2014-07-03 DIAGNOSIS — I10 Essential (primary) hypertension: Secondary | ICD-10-CM | POA: Diagnosis not present

## 2014-07-03 DIAGNOSIS — F0391 Unspecified dementia with behavioral disturbance: Secondary | ICD-10-CM

## 2014-07-03 DIAGNOSIS — I4891 Unspecified atrial fibrillation: Secondary | ICD-10-CM | POA: Diagnosis not present

## 2014-07-03 DIAGNOSIS — F03918 Unspecified dementia, unspecified severity, with other behavioral disturbance: Secondary | ICD-10-CM

## 2014-07-03 HISTORY — DX: Unspecified atrial fibrillation: I48.91

## 2014-07-03 MED ORDER — DILTIAZEM HCL ER COATED BEADS 180 MG PO CP24: 180.0000 mg | ORAL_CAPSULE | Freq: Every day | ORAL | Status: DC

## 2014-07-03 MED ORDER — METOPROLOL TARTRATE 50 MG PO TABS: 50.0000 mg | ORAL_TABLET | Freq: Two times a day (BID) | ORAL | Status: DC

## 2014-07-03 NOTE — Patient Instructions (Signed)
Your physician has recommended you make the following change in your medication...  1. STOP lisinopril  2. INCREASE diltiazem to 180mg  once daily - for your heart rate  Your physician recommends that you schedule a follow-up appointment in [place] with Dr. Debara Pickett

## 2014-07-03 NOTE — Progress Notes (Signed)
OFFICE NOTE  Chief Complaint:  Palpitations, fatigue  Primary Care Physician: Hollace Kinnier, DO  HPI:  Ariana [name] an 81 year old female with history of some confusion in the past, hypertension and Sjogren syndrome. I had added lisinopril to her regimen which has helped control her blood pressure much better. She does report good control over her blood pressure over the past year; however, has had recent worsening problems such as cough, new productive sputum, problems with urinary tract infections. Weight is up back to 126 pounds after dip to 116 pounds but this is about where she is normally with a good appetite. Blood pressure is noted to be mildly elevated today; however, she is in some discomfort and has been short of breath with productive cough. Denies any chest pain. She does have pain in her back that shoots to her right thigh when sitting or leaning down. Today she has a number of other complaints. Her main issue is palpitations which she reports she had an episode of fast heart rate for almost one month but it has somewhat resolved. She still gets some recurrent palpitations.  I saw Ariana [name] back in the office today. She was recently seen by [name] and according to her notes his had marked progression of her dementia. She's now having significant behavioral disturbance as well as wandering. This is cause significant stress for her daughter and family. Compliance with medications is poor. There have been falls at home. Today in the office she is noted to be in new onset atrial fibrillation with rapid ventricular response. Heart rate is in the 120s. I had a long discussion with her daughter and her to the extent that she could understand about atrial fibrillation and her increased risk for stroke. Her CHADSVASC score is elevated at 4. She's also complaining of some abdominal discomfort but no significant chest pain. Blood pressure was low today at 90/70.  PMHx:  Past Medical  History  Diagnosis Date  . Sjogren's syndrome   . Dry eye syndrome   . Hypertension, benign   . Mitral valve prolapse   . GERD (gastroesophageal reflux disease)   . Diverticulosis of colon   . Irritable bowel syndrome   . Urinary incontinence   . Low back pain syndrome   . Fibromyalgia   . Memory loss   . Anxiety and depression   . History of adverse drug reaction   . Peripheral neuropathy     "both feet and legs"  . Shortness of breath 07/18/11    "alot lately"  . Anginal pain   . History of recurrent UTIs   . H/O hiatal hernia   . Anxiety   . Dementia   . Depression   . Abnormality of gait   . Thyroid nodule   . Headache(784.0) 09/05/2012  . Adenomatous polyp of colon 2002    78mm    Past Surgical History  Procedure Laterality Date  . Vesicovaginal fistula closure w/ tah    . Appendectomy    . Cholecystectomy  2000  . Mandible surgery    . Temporomandibular joint surgery  1986    Dr. Terence Lux  . Cataract extraction, bilateral    . Abdominal hysterectomy  1967  . Dental surgery      multiple tooth extractions  . Esophagogastroduodenoscopy (egd) with esophageal dilation N/A 08/23/2012    Procedure: ESOPHAGOGASTRODUODENOSCOPY (EGD) WITH ESOPHAGEAL DILATION;  Surgeon: Milus Banister, MD;  Location: [facility];  Service: Endoscopy;  Laterality: N/A;  .  Colonoscopy w/ biopsies      multiple  . Nasal septum surgery  1980  . Transthoracic echocardiogram  2001    mild LVH, normal LV  . Nm myocar perf wall motion  2003    persantine - normal static and dynamic study w/apical thinning and presvered LV function, no ischemia  . Cardiac catheterization  02/17/2003    normal L main, LAD free of disease, Cfx free of disease, RCA free of disease (Dr. Rockne Menghini)    FAMHx:  Family History  Problem Relation Age of Onset  . Heart disease Father     heart attack  . Pneumonia Mother   . Heart attack Mother   . Hypertension Mother   . Hypertension Maternal Grandmother   .  Colon cancer Sister   . Kidney disease Daughter   . Asthma Daughter   . Arthritis Daughter 45    osteo,  . Heart disease Son 64    stage 3 CHF(Diastolic /Systolic)  . Throat cancer Brother     SOCHx:   reports that she has quit smoking. She has never used smokeless tobacco. She reports that she does not drink alcohol or use illicit drugs.  ALLERGIES:  Allergies  Allergen Reactions  . Codeine Nausea Only    unless given with Phenergan  . Doxycycline     Unknown  . Klonopin [Clonazepam]     Causes hallucination   . Meperidine Hcl Nausea Only    unless given with Phenergan  . Naproxen   . Norflex [Orphenadrine Citrate] Nausea Only    Unless given with Phenergan  . Oxycodone-Acetaminophen Nausea Only    unless given with phenergan  . Penicillins     Unknown  . Phenothiazines     Unknown  . Propoxyphene Hcl Nausea Only    unless given with phenergan  . Stelazine     Unknown  . Sulfamethoxazole-Trimethoprim     Unknown  . Tolectin [Tolmetin Sodium]     Unknown  . Tramadol     Unknown  . Zoloft [Sertraline Hcl]     Caused pt to sleep a lot  . Lubiprostone Rash    ROS: A comprehensive review of systems was negative except for: Constitutional: positive for fatigue Cardiovascular: positive for palpitations Gastrointestinal: positive for abdominal pain  HOME MEDS: Current Outpatient Prescriptions  Medication Sig Dispense Refill  . ALPRAZolam (XANAX) 0.5 MG tablet Take 0.5 mg by mouth. Take 1 tab @@2pm  and 1 tab at bedtime    . aspirin EC 81 MG tablet Take 81 mg by mouth daily.    Marland Kitchen CALCIUM CARBONATE PO Take 1 tablet by mouth daily.    . carboxymethylcellulose (REFRESH TEARS) 0.5 % SOLN 1 drop 2 (two) times daily as needed (dry eyes).     . cetirizine (ZYRTEC) 10 MG tablet Take 1 tablet (10 mg total) by mouth daily. 14 tablet 0  . CRANBERRY SOFT PO Take 1 capsule by mouth daily.    . DULoxetine (CYMBALTA) 30 MG capsule Take one tablet  once daily in the morning for  depression 30 capsule 3  . DULoxetine (CYMBALTA) 60 MG capsule Take one tablet by mouth in the evening once daily for depression 30 capsule 3  . HYDROcodone-acetaminophen (NORCO/VICODIN) 5-325 MG per tablet Take 1 tablet by mouth 2 (two) times daily as needed for moderate pain.    . hydroxychloroquine (PLAQUENIL) 200 MG tablet Take 200 mg by mouth daily.     Marland Kitchen lactose free nutrition (BOOST PLUS)  LIQD Take 237 mLs by mouth 2 (two) times daily between meals.  0  . lansoprazole (PREVACID) 30 MG capsule Take 1 capsule (30 mg total) by mouth daily at 12 noon. 30 capsule 5  . lubiprostone (AMITIZA) 8 MCG capsule Take 1 capsule (8 mcg total) by mouth 2 (two) times daily with a meal. 60 capsule 1  . memantine (NAMENDA XR) 28 MG CP24 24 hr capsule Take one tablet by mouth once daily to preserve memory 90 capsule 3  . metoprolol (LOPRESSOR) 50 MG tablet Take 1 tablet (50 mg total) by mouth 2 (two) times daily. 60 tablet 6  . mirabegron ER (MYRBETRIQ) 50 MG TB24 tablet Take 1 tablet (50 mg total) by mouth daily. Take one tablet daily for bladder 30 tablet 3  . nystatin (MYCOSTATIN) 100000 UNIT/ML suspension TAKE 1 TEASPOONFUL BY MOUTH 4 TIMES A DAY 60 mL 5  . pilocarpine (SALAGEN) 5 MG tablet Take 5 mg by mouth 2 (two) times daily.    . pregabalin (LYRICA) 75 MG capsule Take 1 capsule (75 mg total) by mouth 2 (two) times daily. 60 capsule 3  . sodium chloride (OCEAN) 0.65 % SOLN nasal spray Place 1 spray into the nose as needed for congestion. 30 mL 0  . triamcinolone ointment (KENALOG) 0.1 %   1  . diltiazem (CARDIZEM CD) 180 MG 24 hr capsule Take 1 capsule (180 mg total) by mouth daily. 30 capsule 6   No current facility-administered medications for this visit.    LABS/IMAGING: No results found for this or any previous visit (from the past 48 hour(s)). No results found.  VITALS: BP 90/70 mmHg  Pulse 123  Ht 5' 3.5" (1.613 m)  Wt 142 lb 8 oz (64.638 kg)  BMI 24.84 kg/m2  EXAM: General  appearance: alert and no distress Neck: no carotid bruit and no JVD Lungs: clear to auscultation bilaterally Heart: irregularly irregular rhythm Abdomen: soft, non-tender; bowel sounds normal; no masses,  no organomegaly Extremities: extremities normal, atraumatic, no cyanosis or edema Pulses: 2+ and symmetric Skin: Skin color, texture, turgor normal. No rashes or lesions Neurologic: Grossly normal Psych: Mood, affect normal  EKG: Atrial fibrillation with rapid ventricular response at 123  ASSESSMENT: 1. New-onset atrial fibrillation with rapid ventricular response 2. Hypertension-now hypotensive 3. Chest wall pain 4. Palpitations 5. Sjogren syndrome 6. Progressive dementia with behavioral disturbance 7. Falls and medication noncompliance  PLAN: 1.   Ms. Boivin has had progressive dementia with behavioral disturbance over the last year. There've been recommendations for placement in a memory unit however she has been resistant to this. There is a fall which caused the need for sutures above her right eye. She has had problems with medication compliance and her medications are now arranged by her family members. Today she is found to be in A. fib with RVR and has a high CHADSVASC score of 4. Normally anticoagulation would be recommended, however given her circumstances with progressive and significant dementia as well as medication noncompliance, disruptive behavior and falls, I do not feel she is a safe candidate to be on anticoagulation. I discussed the risks of this including the possible risk of stroke which is elevated with her and her daughter who is present. They understand the dangers of blood thinners in this situation. Based on the fact that it would be unsafe to use a blood thinner, pursuing a cardioversion or trying to get her heart back into sinus rhythm would not be possible without blood thinners. For  now, we'll focus on rate control. I recommend discontinuing her lisinopril and  increasing Cardizem up to 180 mg daily.  This is an unfortunate situation and is very difficult on her family members. Hopefully with further guidance from her primary care provider she could get placement in full-time care which she definitely needs. Plan to see her back in one month to evaluate her rate control.  Pixie Casino, MD, [facility] Attending Cardiologist [place] [facility] Ondrea Dow 07/03/2014, 4:53 PM

## 2014-07-08 ENCOUNTER — Other Ambulatory Visit (INDEPENDENT_AMBULATORY_CARE_PROVIDER_SITE_OTHER): Payer: Medicare Other

## 2014-07-08 ENCOUNTER — Encounter: Payer: Self-pay | Admitting: Internal Medicine

## 2014-07-08 ENCOUNTER — Other Ambulatory Visit: Payer: Self-pay | Admitting: Internal Medicine

## 2014-07-08 ENCOUNTER — Ambulatory Visit (INDEPENDENT_AMBULATORY_CARE_PROVIDER_SITE_OTHER): Payer: Medicare Other | Admitting: Internal Medicine

## 2014-07-08 VITALS — BP 102/80 | HR 64 | Ht 62.0 in | Wt 141.2 lb

## 2014-07-08 DIAGNOSIS — R32 Unspecified urinary incontinence: Secondary | ICD-10-CM | POA: Diagnosis not present

## 2014-07-08 DIAGNOSIS — K5903 Drug induced constipation: Secondary | ICD-10-CM

## 2014-07-08 DIAGNOSIS — R140 Abdominal distension (gaseous): Secondary | ICD-10-CM | POA: Diagnosis not present

## 2014-07-08 DIAGNOSIS — K5909 Other constipation: Secondary | ICD-10-CM

## 2014-07-08 DIAGNOSIS — T402X5A Adverse effect of other opioids, initial encounter: Secondary | ICD-10-CM | POA: Diagnosis not present

## 2014-07-08 DIAGNOSIS — K5900 Constipation, unspecified: Secondary | ICD-10-CM | POA: Diagnosis not present

## 2014-07-08 LAB — TSH: TSH: 0.82 u[IU]/mL (ref 0.35–4.50)

## 2014-07-08 MED ORDER — BISACODYL 5 MG PO TBEC: 10.0000 mg | DELAYED_RELEASE_TABLET | Freq: Every day | ORAL | Status: DC | PRN

## 2014-07-08 MED ORDER — LUBIPROSTONE 24 MCG PO CAPS: 24.0000 ug | ORAL_CAPSULE | Freq: Two times a day (BID) | ORAL | Status: DC

## 2014-07-08 NOTE — Progress Notes (Signed)
   Subjective:    Patient ID: Ariana [name], female    DOB: 1929/05/22, 81 y.o.   MRN: [id number] Cc: constipation HPI  The patient is here with her daughter and c/o of persistent constipation and abdominal pain, mild and crampy in LQ's. Better w/ defecation. She is frustrated with continued sxs and also urinary incontinence/frequency. She has difficulty producing effective defecation when she gets urges. Small hard balls of stool. Last intervention was to start Amitiza at 8 ug bid. No clear benefit. Not describing any side effects.  Medications, allergies, past medical history, past surgical history, family history and social history are reviewed and updated in the EMR.   Review of Systems Dementia, gaining weight over time  Wt Readings from Last 3 Encounters:  07/08/14 141 lb 4 oz (64.071 kg)  07/03/14 142 lb 8 oz (64.638 kg)  06/12/14 140 lb (63.504 kg)   09/2013 111#    Objective:   Physical Exam  BP 102/80 mmHg  Pulse 64  Ht 5\' 2"  (1.575 m)  Wt 141 lb 4 oz (64.071 kg)  BMI 25.83 kg/m2    2 view Abd 05/15/2014 - no abnormalities    Assessment & Plan:  Constipation due to opioid therapy  Bloating  Chronic constipation  Urinary incontinence, unspecified incontinence type  Weight gain   MiraLax purge Amitiza 24 ug bid Bisacodyl prn TSH  Phone f/u after few weeks, ? Go for Linzess if this does not work  TSH was NL Lab Results  Component Value Date   TSH 0.82 07/08/2014   25 minutes time spent with patient > half in counseling coordination of care

## 2014-07-08 NOTE — Patient Instructions (Signed)
Dr Carlean Purl recommends that you complete a bowel purge (to clean out your bowels). Please do the following: Purchase a bottle of Miralax over the counter as well as a box of 5 mg dulcolax tablets. Take 4 dulcolax tablets. Wait 1 hour. You will then drink 6-8 capfuls of Miralax mixed in an adequate amount of water/juice/gatorade (you may choose which of these liquids to drink) over the next 2-3 hours. You should expect results within 1 to 6 hours after completing the bowel purge.  Your physician has requested that you go to the basement for the following lab work before leaving today: TSH  We have sent the following medications to your pharmacy for you to pick up at your convenience: Amitiza  Take 2 Dulcolax daily as needed for constipation.  Dulcolax is over the counter.   Call us in 3 to 4 weeks with an update.   I appreciate the opportunity to care for you. Silvano Rusk, M.D., [facility]

## 2014-07-10 ENCOUNTER — Other Ambulatory Visit: Payer: Self-pay | Admitting: *Deleted

## 2014-07-10 MED ORDER — ALPRAZOLAM 0.5 MG PO TABS: ORAL_TABLET | ORAL | Status: DC

## 2014-07-10 NOTE — Telephone Encounter (Signed)
Patient daughter requested.  

## 2014-07-10 NOTE — Progress Notes (Signed)
Quick Note:  Thyroid working normally Hope increase in [place] and intermittent dulcolax prn works F/u prn ______

## 2014-07-11 ENCOUNTER — Encounter: Payer: Self-pay | Admitting: Internal Medicine

## 2014-07-26 ENCOUNTER — Emergency Department (HOSPITAL_COMMUNITY): Payer: Medicare Other

## 2014-07-26 ENCOUNTER — Inpatient Hospital Stay (HOSPITAL_COMMUNITY): Payer: Medicare Other

## 2014-07-26 ENCOUNTER — Encounter (HOSPITAL_COMMUNITY): Payer: Self-pay | Admitting: Emergency Medicine

## 2014-07-26 ENCOUNTER — Observation Stay (HOSPITAL_COMMUNITY)
Admission: EM | Admit: 2014-07-26 | Discharge: 2014-07-28 | Disposition: A | Discharge status: Home / Self Care | Payer: Medicare Other | Attending: Internal Medicine | Admitting: Internal Medicine

## 2014-07-26 DIAGNOSIS — I639 Cerebral infarction, unspecified: Secondary | ICD-10-CM | POA: Diagnosis present

## 2014-07-26 DIAGNOSIS — I10 Essential (primary) hypertension: Secondary | ICD-10-CM | POA: Insufficient documentation

## 2014-07-26 DIAGNOSIS — I341 Nonrheumatic mitral (valve) prolapse: Secondary | ICD-10-CM | POA: Insufficient documentation

## 2014-07-26 DIAGNOSIS — Z888 Allergy status to other drugs, medicaments and biological substances status: Secondary | ICD-10-CM | POA: Diagnosis not present

## 2014-07-26 DIAGNOSIS — Z7982 Long term (current) use of aspirin: Secondary | ICD-10-CM | POA: Insufficient documentation

## 2014-07-26 DIAGNOSIS — Z880 Allergy status to penicillin: Secondary | ICD-10-CM | POA: Diagnosis not present

## 2014-07-26 DIAGNOSIS — M797 Fibromyalgia: Secondary | ICD-10-CM | POA: Diagnosis not present

## 2014-07-26 DIAGNOSIS — I638 Other cerebral infarction: Secondary | ICD-10-CM | POA: Diagnosis not present

## 2014-07-26 DIAGNOSIS — R32 Unspecified urinary incontinence: Secondary | ICD-10-CM | POA: Diagnosis not present

## 2014-07-26 DIAGNOSIS — M25462 Effusion, left knee: Secondary | ICD-10-CM | POA: Diagnosis not present

## 2014-07-26 DIAGNOSIS — Z881 Allergy status to other antibiotic agents status: Secondary | ICD-10-CM | POA: Insufficient documentation

## 2014-07-26 DIAGNOSIS — M545 Low back pain: Secondary | ICD-10-CM | POA: Insufficient documentation

## 2014-07-26 DIAGNOSIS — Z87891 Personal history of nicotine dependence: Secondary | ICD-10-CM | POA: Diagnosis not present

## 2014-07-26 DIAGNOSIS — I6350 Cerebral infarction due to unspecified occlusion or stenosis of unspecified cerebral artery: Secondary | ICD-10-CM | POA: Diagnosis present

## 2014-07-26 DIAGNOSIS — Z885 Allergy status to narcotic agent status: Secondary | ICD-10-CM | POA: Insufficient documentation

## 2014-07-26 DIAGNOSIS — Z8673 Personal history of transient ischemic attack (TIA), and cerebral infarction without residual deficits: Secondary | ICD-10-CM | POA: Diagnosis not present

## 2014-07-26 DIAGNOSIS — M5032 Other cervical disc degeneration, mid-cervical region: Secondary | ICD-10-CM | POA: Insufficient documentation

## 2014-07-26 DIAGNOSIS — F329 Major depressive disorder, single episode, unspecified: Secondary | ICD-10-CM | POA: Diagnosis not present

## 2014-07-26 DIAGNOSIS — M3500 Sicca syndrome, unspecified: Secondary | ICD-10-CM | POA: Diagnosis not present

## 2014-07-26 DIAGNOSIS — Y9389 Activity, other specified: Secondary | ICD-10-CM | POA: Diagnosis not present

## 2014-07-26 DIAGNOSIS — I63411 Cerebral infarction due to embolism of right middle cerebral artery: Secondary | ICD-10-CM

## 2014-07-26 DIAGNOSIS — W07XXXA Fall from chair, initial encounter: Secondary | ICD-10-CM | POA: Diagnosis not present

## 2014-07-26 DIAGNOSIS — F419 Anxiety disorder, unspecified: Secondary | ICD-10-CM | POA: Diagnosis not present

## 2014-07-26 DIAGNOSIS — Y998 Other external cause status: Secondary | ICD-10-CM | POA: Diagnosis not present

## 2014-07-26 DIAGNOSIS — I4891 Unspecified atrial fibrillation: Secondary | ICD-10-CM | POA: Diagnosis not present

## 2014-07-26 DIAGNOSIS — W19XXXA Unspecified fall, initial encounter: Secondary | ICD-10-CM

## 2014-07-26 DIAGNOSIS — K589 Irritable bowel syndrome without diarrhea: Secondary | ICD-10-CM | POA: Diagnosis not present

## 2014-07-26 DIAGNOSIS — H04129 Dry eye syndrome of unspecified lacrimal gland: Secondary | ICD-10-CM | POA: Insufficient documentation

## 2014-07-26 DIAGNOSIS — I6523 Occlusion and stenosis of bilateral carotid arteries: Secondary | ICD-10-CM | POA: Insufficient documentation

## 2014-07-26 DIAGNOSIS — R296 Repeated falls: Secondary | ICD-10-CM

## 2014-07-26 DIAGNOSIS — G629 Polyneuropathy, unspecified: Secondary | ICD-10-CM | POA: Insufficient documentation

## 2014-07-26 DIAGNOSIS — G9389 Other specified disorders of brain: Secondary | ICD-10-CM | POA: Diagnosis not present

## 2014-07-26 DIAGNOSIS — Z86010 Personal history of colonic polyps: Secondary | ICD-10-CM | POA: Diagnosis not present

## 2014-07-26 DIAGNOSIS — M549 Dorsalgia, unspecified: Secondary | ICD-10-CM | POA: Diagnosis not present

## 2014-07-26 DIAGNOSIS — I6389 Other cerebral infarction: Secondary | ICD-10-CM | POA: Insufficient documentation

## 2014-07-26 DIAGNOSIS — Z9102 Food additives allergy status: Secondary | ICD-10-CM | POA: Insufficient documentation

## 2014-07-26 DIAGNOSIS — F0390 Unspecified dementia without behavioral disturbance: Secondary | ICD-10-CM | POA: Diagnosis not present

## 2014-07-26 DIAGNOSIS — Z882 Allergy status to sulfonamides status: Secondary | ICD-10-CM | POA: Insufficient documentation

## 2014-07-26 DIAGNOSIS — Y9289 Other specified places as the place of occurrence of the external cause: Secondary | ICD-10-CM | POA: Insufficient documentation

## 2014-07-26 DIAGNOSIS — K219 Gastro-esophageal reflux disease without esophagitis: Secondary | ICD-10-CM | POA: Diagnosis not present

## 2014-07-26 DIAGNOSIS — M25562 Pain in left knee: Secondary | ICD-10-CM | POA: Diagnosis not present

## 2014-07-26 DIAGNOSIS — G8929 Other chronic pain: Secondary | ICD-10-CM | POA: Insufficient documentation

## 2014-07-26 DIAGNOSIS — F03A0 Unspecified dementia, mild, without behavioral disturbance, psychotic disturbance, mood disturbance, and anxiety: Secondary | ICD-10-CM | POA: Insufficient documentation

## 2014-07-26 LAB — I-STAT CHEM 8, ED
BUN: 16 mg/dL (ref 6–20)
Calcium, Ion: 1.22 mmol/L (ref 1.13–1.30)
Chloride: 103 mmol/L (ref 101–111)
Creatinine, Ser: 0.7 mg/dL (ref 0.44–1.00)
Glucose, Bld: 99 mg/dL (ref 65–99)
HCT: 42 % (ref 36.0–46.0)
Hemoglobin: 14.3 g/dL (ref 12.0–15.0)
Potassium: 4 mmol/L (ref 3.5–5.1)
Sodium: 142 mmol/L (ref 135–145)
TCO2: 25 mmol/L (ref 0–100)

## 2014-07-26 LAB — URINE MICROSCOPIC-ADD ON

## 2014-07-26 LAB — URINALYSIS, ROUTINE W REFLEX MICROSCOPIC
Bilirubin Urine: NEGATIVE
GLUCOSE, UA: NEGATIVE mg/dL
HGB URINE DIPSTICK: NEGATIVE
Ketones, ur: NEGATIVE mg/dL
Nitrite: NEGATIVE
PROTEIN: NEGATIVE mg/dL
Specific Gravity, Urine: 1.011 (ref 1.005–1.030)
UROBILINOGEN UA: 0.2 mg/dL (ref 0.0–1.0)
pH: 5 (ref 5.0–8.0)

## 2014-07-26 LAB — CBC WITH DIFFERENTIAL/PLATELET
BASOS PCT: 0 % (ref 0–1)
Basophils Absolute: 0 10*3/uL (ref 0.0–0.1)
Eosinophils Absolute: 0.2 10*3/uL (ref 0.0–0.7)
Eosinophils Relative: 3 % (ref 0–5)
HCT: 39 % (ref 36.0–46.0)
HEMOGLOBIN: 12.7 g/dL (ref 12.0–15.0)
Lymphocytes Relative: 22 % (ref 12–46)
Lymphs Abs: 1.4 10*3/uL (ref 0.7–4.0)
MCH: 30.8 pg (ref 26.0–34.0)
MCHC: 32.6 g/dL (ref 30.0–36.0)
MCV: 94.4 fL (ref 78.0–100.0)
Monocytes Absolute: 0.7 10*3/uL (ref 0.1–1.0)
Monocytes Relative: 10 % (ref 3–12)
Neutro Abs: 4.2 10*3/uL (ref 1.7–7.7)
Neutrophils Relative %: 65 % (ref 43–77)
PLATELETS: 217 10*3/uL (ref 150–400)
RBC: 4.13 MIL/uL (ref 3.87–5.11)
RDW: 13.1 % (ref 11.5–15.5)
WBC: 6.5 10*3/uL (ref 4.0–10.5)

## 2014-07-26 LAB — I-STAT TROPONIN, ED: TROPONIN I, POC: 0.01 ng/mL (ref 0.00–0.08)

## 2014-07-26 MED ORDER — HYDROCODONE-ACETAMINOPHEN 5-325 MG PO TABS
1.0000 | ORAL_TABLET | Freq: Two times a day (BID) | ORAL | Status: DC | PRN
  Administered 2014-07-27 – 2014-07-28 (×2): 1 via ORAL
  Filled 2014-07-26 (×2): qty 1

## 2014-07-26 MED ORDER — DULOXETINE HCL 60 MG PO CPEP
60.0000 mg | ORAL_CAPSULE | Freq: Every day | ORAL | Status: DC
  Administered 2014-07-27: 60 mg via ORAL
  Filled 2014-07-26: qty 1

## 2014-07-26 MED ORDER — ASPIRIN 325 MG PO TABS
325.0000 mg | ORAL_TABLET | Freq: Every day | ORAL | Status: DC
  Administered 2014-07-26 – 2014-07-28 (×3): 325 mg via ORAL
  Filled 2014-07-26 (×3): qty 1

## 2014-07-26 MED ORDER — PREGABALIN 75 MG PO CAPS
75.0000 mg | ORAL_CAPSULE | Freq: Two times a day (BID) | ORAL | Status: DC
  Administered 2014-07-26 – 2014-07-28 (×4): 75 mg via ORAL
  Filled 2014-07-26 (×4): qty 1

## 2014-07-26 MED ORDER — SENNOSIDES-DOCUSATE SODIUM 8.6-50 MG PO TABS: 1.0000 | ORAL_TABLET | Freq: Every evening | ORAL | Status: DC | PRN

## 2014-07-26 MED ORDER — MORPHINE SULFATE 4 MG/ML IJ SOLN
4.0000 mg | Freq: Once | INTRAMUSCULAR | Status: AC
  Administered 2014-07-26: 4 mg via INTRAVENOUS
  Filled 2014-07-26: qty 1

## 2014-07-26 MED ORDER — MEMANTINE HCL ER 28 MG PO CP24
28.0000 mg | ORAL_CAPSULE | Freq: Every day | ORAL | Status: DC
Start: 2014-07-27 — End: 2014-07-28
  Administered 2014-07-27 – 2014-07-28 (×2): 28 mg via ORAL
  Filled 2014-07-26 (×2): qty 1

## 2014-07-26 MED ORDER — POLYVINYL ALCOHOL 1.4 % OP SOLN
1.0000 [drp] | OPHTHALMIC | Status: DC | PRN
  Filled 2014-07-26: qty 15

## 2014-07-26 MED ORDER — HYDROXYCHLOROQUINE SULFATE 200 MG PO TABS
200.0000 mg | ORAL_TABLET | Freq: Every day | ORAL | Status: DC
  Administered 2014-07-27 – 2014-07-28 (×2): 200 mg via ORAL
  Filled 2014-07-26 (×2): qty 1

## 2014-07-26 MED ORDER — METOPROLOL TARTRATE 50 MG PO TABS
50.0000 mg | ORAL_TABLET | Freq: Two times a day (BID) | ORAL | Status: DC
  Administered 2014-07-27 – 2014-07-28 (×3): 50 mg via ORAL
  Filled 2014-07-26 (×3): qty 1

## 2014-07-26 MED ORDER — STROKE: EARLY STAGES OF RECOVERY BOOK
Freq: Once | Status: AC
  Administered 2014-07-26: 22:00:00

## 2014-07-26 MED ORDER — DULOXETINE HCL 30 MG PO CPEP
30.0000 mg | ORAL_CAPSULE | Freq: Every day | ORAL | Status: DC
Start: 2014-07-27 — End: 2014-07-28
  Administered 2014-07-27 – 2014-07-28 (×2): 30 mg via ORAL
  Filled 2014-07-26 (×2): qty 1

## 2014-07-26 MED ORDER — ENOXAPARIN SODIUM 40 MG/0.4ML ~~LOC~~ SOLN: 40.0000 mg | SUBCUTANEOUS | Status: DC

## 2014-07-26 MED ORDER — MORPHINE SULFATE 4 MG/ML IJ SOLN
4.0000 mg | Freq: Once | INTRAMUSCULAR | Status: AC
Start: 2014-07-26 — End: 2014-07-26
  Administered 2014-07-26: 4 mg via INTRAVENOUS
  Filled 2014-07-26: qty 1

## 2014-07-26 MED ORDER — CARBOXYMETHYLCELLULOSE SODIUM 0.5 % OP SOLN
1.0000 [drp] | Freq: Two times a day (BID) | OPHTHALMIC | Status: DC | PRN
  Filled 2014-07-26: qty 1

## 2014-07-26 MED ORDER — DILTIAZEM HCL ER COATED BEADS 180 MG PO CP24
180.0000 mg | ORAL_CAPSULE | Freq: Every day | ORAL | Status: DC
Start: 2014-07-27 — End: 2014-07-28
  Administered 2014-07-27 – 2014-07-28 (×2): 180 mg via ORAL
  Filled 2014-07-26 (×2): qty 1

## 2014-07-26 MED ORDER — ENOXAPARIN SODIUM 40 MG/0.4ML ~~LOC~~ SOLN
40.0000 mg | SUBCUTANEOUS | Status: DC
  Administered 2014-07-26 – 2014-07-27 (×2): 40 mg via SUBCUTANEOUS
  Filled 2014-07-26 (×2): qty 0.4

## 2014-07-26 NOTE — ED Notes (Signed)
Pt log rolled off of back board by staff and EDP, Jacubowitz.

## 2014-07-26 NOTE — Progress Notes (Signed)
Patient arrived to 4N03 AAOx4 but forgetful. Vital signs taken and tele placed. Orders reviewed and will continue to monitor. Cailin Gebel, Rande Brunt, RN

## 2014-07-26 NOTE — Progress Notes (Signed)
Admission charting started. Please refer to admission tab for remaining information.

## 2014-07-26 NOTE — ED Notes (Signed)
Patient transported to CT 

## 2014-07-26 NOTE — ED Provider Notes (Signed)
CSN: [id number]     Arrival date & time 07/26/14  1331 History   First MD Initiated Contact with Patient 07/26/14 1457     Chief Complaint  Patient presents with  . Fall     (Consider location/radiation/quality/duration/timing/severity/associated sxs/prior Treatment) HPI   81 year old female with hx of fibromyalgias, chronic back pain, hypertension, dementia, abnormal gait brought here via EMS from home for evaluation of a fall.  History obtained through patient and through daughter who is at bedside. Patient has chronic back pain and take Norco on a regular basis. She has been having constipation secondary to opiate use. Her doctor has placed her on Amitiza to help with the constipation for the past month. Since then she has had recurrent diarrhea. For the past 3 days she has had 2 separate episodes of fall. The first episode was when she went to her bathroom to have a bowel movement, she stood up, lost balance and fell striking her left knee and left ankle against the ground. She was not evaluated at that time. Today, she was getting out from her couch but states she slipped down onto the ground and was having difficulty getting up. She has to crawl to a phone to call her neighbor who contacted EMS to bring her here. At this time she is complaining of a mild headache to her forehead, tenderness to the right side of her neck, complaining of pain to her right lower back, as well as pain to her left knee. Patient states pain is mild to moderate. She denies any precipitating symptoms prior to the fall. She denies any URI symptoms. No chest pain or trouble breathing. She does endorse increased urinary frequency for an unknown duration. She wears adult diaper. She usually walks with a cane or a walker but was unable to get to it today. No complaints of any new numbness or weakness or difficulty thinking. She is not on any anticoagulation.  Past Medical History  Diagnosis Date  . Sjogren's syndrome   .  Dry eye syndrome   . Hypertension, benign   . Mitral valve prolapse   . GERD (gastroesophageal reflux disease)   . Diverticulosis of colon   . Irritable bowel syndrome   . Urinary incontinence   . Low back pain syndrome   . Fibromyalgia   . Memory loss   . Anxiety and depression   . History of adverse drug reaction   . Peripheral neuropathy     "both feet and legs"  . Shortness of breath 07/18/11    "alot lately"  . Anginal pain   . History of recurrent UTIs   . H/O hiatal hernia   . Anxiety   . Dementia   . Depression   . Abnormality of gait   . Thyroid nodule   . Headache(784.0) 09/05/2012  . Adenomatous polyp of colon 2002    41mm   Past Surgical History  Procedure Laterality Date  . Vesicovaginal fistula closure w/ tah    . Appendectomy    . Cholecystectomy  2000  . Mandible surgery    . Temporomandibular joint surgery  1986    Dr. Terence Lux  . Cataract extraction, bilateral    . Abdominal hysterectomy  1967  . Dental surgery      multiple tooth extractions  . Esophagogastroduodenoscopy (egd) with esophageal dilation N/A 08/23/2012    Procedure: ESOPHAGOGASTRODUODENOSCOPY (EGD) WITH ESOPHAGEAL DILATION;  Surgeon: Milus Banister, MD;  Location: [facility];  Service: Endoscopy;  Laterality: N/A;  . Colonoscopy w/ biopsies      multiple  . Nasal septum surgery  1980  . Transthoracic echocardiogram  2001    mild LVH, normal LV  . Nm myocar perf wall motion  2003    persantine - normal static and dynamic study w/apical thinning and presvered LV function, no ischemia  . Cardiac catheterization  02/17/2003    normal L main, LAD free of disease, Cfx free of disease, RCA free of disease (Dr. Rockne Menghini)   Family History  Problem Relation Age of Onset  . Heart disease Father     heart attack  . Pneumonia Mother   . Heart attack Mother   . Hypertension Mother   . Hypertension Maternal Grandmother   . Colon cancer Sister   . Kidney disease Daughter   . Asthma Daughter    . Arthritis Daughter 46    osteo,  . Heart disease Son 34    stage 3 CHF(Diastolic /Systolic)  . Throat cancer Brother    History  Substance Use Topics  . Smoking status: Former Research scientist (life sciences)  . Smokeless tobacco: Never Used     Comment: Quit at age 78   . Alcohol Use: No   OB History    No data available     Review of Systems  All other systems reviewed and are negative.     Allergies  Banana; Codeine; Doxycycline; Klonopin; Meperidine hcl; Naproxen; Norflex; Oxycodone-acetaminophen; Penicillins; Phenothiazines; Propoxyphene hcl; Stelazine; Sulfamethoxazole-trimethoprim; Tolectin; Tramadol; and Zoloft  Home Medications   Prior to Admission medications   Medication Sig Start Date End Date Taking? Authorizing Provider  ALPRAZolam Duanne Moron) 0.5 MG tablet Take one tablet by mouth twice daily for anxiety 07/10/14  Yes Tiffany L Reed, DO  diltiazem (CARDIZEM CD) 180 MG 24 hr capsule Take 1 capsule (180 mg total) by mouth daily. 07/03/14  Yes Pixie Casino, MD  DULoxetine (CYMBALTA) 30 MG capsule Take one tablet  once daily in the morning for depression 06/12/14  Yes Tiffany L Reed, DO  DULoxetine (CYMBALTA) 60 MG capsule Take one tablet by mouth in the evening once daily for depression 06/12/14  Yes Tiffany L Reed, DO  HYDROcodone-acetaminophen (NORCO/VICODIN) 5-325 MG per tablet Take 1 tablet by mouth 3 (three) times daily as needed for moderate pain.    Yes Historical Provider, MD  hydroxychloroquine (PLAQUENIL) 200 MG tablet Take 200 mg by mouth daily.    Yes Historical Provider, MD  lansoprazole (PREVACID) 30 MG capsule Take 1 capsule (30 mg total) by mouth daily at 12 noon. 01/12/14  Yes Tiffany L Reed, DO  lubiprostone (AMITIZA) 24 MCG capsule Take 1 capsule (24 mcg total) by mouth 2 (two) times daily with a meal. 07/08/14  Yes Gatha Mayer, MD  metoprolol (LOPRESSOR) 50 MG tablet Take 1 tablet (50 mg total) by mouth 2 (two) times daily. 07/03/14  Yes Pixie Casino, MD  mirabegron  ER (MYRBETRIQ) 50 MG TB24 tablet Take 1 tablet (50 mg total) by mouth daily. Take one tablet daily for bladder 05/11/14  Yes Tiffany L Reed, DO  NAMENDA XR 28 MG CP24 24 hr capsule TAKE ONE CAPSULE BY MOUTH EVERY DAY TO PRESERVE MEMORY 07/08/14  Yes Tiffany L Reed, DO  nystatin (MYCOSTATIN) 100000 UNIT/ML suspension TAKE 1 TEASPOONFUL BY MOUTH 4 TIMES A DAY 02/09/14  Yes Tiffany L Reed, DO  pilocarpine (SALAGEN) 5 MG tablet Take 5 mg by mouth 2 (two) times daily.   Yes Historical Provider,  MD  pregabalin (LYRICA) 75 MG capsule Take 1 capsule (75 mg total) by mouth 2 (two) times daily. 03/23/14  Yes Tiffany L Reed, DO  aspirin EC 81 MG tablet Take 81 mg by mouth daily.    Historical Provider, MD  bisacodyl (BISACODYL) 5 MG EC tablet Take 2 tablets (10 mg total) by mouth daily as needed for moderate constipation. If no effective bowel movement every 2- 3 days 07/08/14   Gatha Mayer, MD  CALCIUM CARBONATE PO Take 1 tablet by mouth daily.    Historical Provider, MD  carboxymethylcellulose (REFRESH TEARS) 0.5 % SOLN 1 drop 2 (two) times daily as needed (dry eyes).     Historical Provider, MD  cetirizine (ZYRTEC) 10 MG tablet Take 1 tablet (10 mg total) by mouth daily. 03/23/14   Tiffany L Reed, DO  CRANBERRY SOFT PO Take 1 capsule by mouth daily.    Historical Provider, MD  lactose free nutrition (BOOST PLUS) LIQD Take 237 mLs by mouth 2 (two) times daily between meals. 07/22/13   Maryann Mikhail, DO  sodium chloride (OCEAN) 0.65 % SOLN nasal spray Place 1 spray into the nose as needed for congestion. 08/23/12   Modena Jansky, MD  triamcinolone ointment (KENALOG) 0.1 %  04/01/14   Historical Provider, MD   BP 123/62 mmHg  Pulse 64  Temp(Src) 97.5 F (36.4 C) (Oral)  Resp 20  SpO2 95% Physical Exam  Constitutional: She appears well-developed and well-nourished. No distress.  Frail-appearing Caucasian female, nontoxic in appearance.  HENT:  Head: Atraumatic.  No scalp tenderness. No midface tenderness.  Patient does point to her forehead and complaining of pain.  Eyes: Conjunctivae and EOM are normal. Pupils are equal, round, and reactive to light.  Neck: Neck supple.  Tenderness to right side of neck without cervical midline spine tenderness.  Cardiovascular: Normal rate and regular rhythm.   Pulmonary/Chest: Effort normal and breath sounds normal.  Abdominal: Soft. There is no tenderness.  Musculoskeletal: She exhibits tenderness (Left knee with tenderness to anterior knee, normal knee flexion and extension and no gross deformity. Normal left ankle and left hip.).  Equal strength to all 4 extremities.  Neurological: She is alert. She has normal strength. No cranial nerve deficit or sensory deficit. GCS eye subscore is 4. GCS verbal subscore is 5. GCS motor subscore is 6.  No arm drift.  Gait not tested  Skin: No rash noted.  Psychiatric: She has a normal mood and affect.  Nursing note and vitals reviewed.   ED Course  Procedures (including critical care time)  Patient with unwitnessed fall, no loss of consciousness, not on anticoagulation. Daughter felt the fall is likely mechanical because it is difficult for patient to get in and out of her couch. However, patient has been complaining of some dysuria including urinary frequency, persistent diarrhea due to taking amitiza.  She has recurrent falls, endorsing headache.  Plan to obtain labs, check UA, perform screening imaging and monitor closely.    4:42 PM Head CT scan shows a new small right parietal infarct, subacute to chronic. Given her increase gait instability and recurrent falls plan to have pt admit for stroke work up, needing PT/OT, echo.     5:37 PM  Appreciated consultation from [facility] Dr. Karleen Hampshire who request consultation with neurology.  She will see pt in ER.   6:02 PM I have consulted with Neurologist Dr. Doy Mince who request to be transfer to [facility] for stroke work up. Dr. Karleen Hampshire will  evaluate pt in ER and will  facility transfer.   Labs Review Labs Reviewed  URINALYSIS, ROUTINE W REFLEX MICROSCOPIC (NOT AT [facility]) - Abnormal; Notable for the following:    APPearance CLOUDY (*)    Leukocytes, UA MODERATE (*)    All other components within normal limits  URINE MICROSCOPIC-ADD ON - Abnormal; Notable for the following:    Squamous Epithelial / LPF FEW (*)    Bacteria, UA MANY (*)    All other components within normal limits  CBC WITH DIFFERENTIAL/PLATELET  I-STAT CHEM 8, ED  I-STAT TROPOININ, ED    Imaging Review Dg Cervical Spine Complete  07/26/2014   CLINICAL DATA:  81 year old female with history of fall earlier today complaining of neck and back pain.  EXAM: CERVICAL SPINE  4+ VIEWS  COMPARISON:  No priors.  FINDINGS: Multiple views of the cervical spine demonstrate no acute displaced fracture. Alignment is anatomic. Prevertebral soft tissues are normal. Severe multilevel degenerative disc disease is noted, most pronounced at C4-C5, C5-C6 and C6-C7. Multilevel facet arthropathy. Edentulous patient.  IMPRESSION: 1. No acute radiographic abnormality of the cervical spine. 2. Severe multilevel degenerative disc disease and cervical spondylosis, as above.   Electronically Signed   By: Vinnie Langton M.D.   On: 07/26/2014 16:45   Dg Lumbar Spine Complete  07/26/2014   CLINICAL DATA:  Slip at home while getting out of chair today. Low back pain. Initial encounter.  EXAM: LUMBAR SPINE - COMPLETE 4+ VIEW  COMPARISON:  Abdominal radiographs 05/15/2014. CT abdomen and pelvis 07/20/2013.  FINDINGS: There are 5 non rib-bearing lumbar type vertebral bodies. Lumbar vertebral alignment is within normal limits. Lumbar vertebral body heights are preserved without evidence of compression fracture. Severe disc space height loss is again seen at L5-S1 with endplate sclerosis and osteophytosis. Intervertebral disc space heights are preserved elsewhere in the lumbar spine. Mild degenerative changes are noted in the lower  thoracic spine. No pars defects are identified. Facet arthrosis is noted L5-S1. Right upper quadrant abdominal surgical clips are noted.  IMPRESSION: No acute osseous abnormality identified.  L5-S1 disc degeneration.   Electronically Signed   By: Logan Bores   On: 07/26/2014 16:50   Ct Head Wo Contrast  07/26/2014   CLINICAL DATA:  Mechanical fall upon getting up from couch. Initial encounter.  EXAM: CT HEAD WITHOUT CONTRAST  TECHNIQUE: Contiguous axial images were obtained from the base of the skull through the vertex without intravenous contrast.  COMPARISON:  07/20/2013  FINDINGS: There is a new 2.5 cm region of relatively well-defined hypoattenuation involving right parietal cortex and subcortical white matter (series 2, image 19). A small, chronic infarct is again seen in the right MCA territory more anteriorly involving the anterior insula and inferior frontal lobe. There is mild global cerebral atrophy. Patchy periventricular white matter hypodensities elsewhere in both cerebral hemispheres are similar to the prior study and nonspecific but compatible with mild chronic small vessel ischemic disease. There is no evidence of acute intracranial hemorrhage, mass, midline shift, or extra-axial fluid collection.  Prior bilateral cataract extraction is noted. 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 Prior right temporal fixation. No acute skull fracture is identified.  IMPRESSION: 1. No acute intracranial hemorrhage. 2. 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   Electronically Signed   By: Logan Bores   On: 07/26/2014 16:06   Dg Knee Complete 4 Views Left  07/26/2014   CLINICAL DATA:  80 year old female with history of trauma  from a fall earlier today complaining of left knee pain.  EXAM: LEFT KNEE - COMPLETE 4+ VIEW  COMPARISON:  No priors.  FINDINGS: Small suprapatellar effusion. No acute displaced fracture, subluxation or dislocation. Mild joint space narrowing,  subchondral sclerosis and osteophyte formation, most evident in the medial and patellofemoral compartments.  IMPRESSION: 1. Small suprapatellar effusion. 2. Negative for fracture. 3. Mild degenerative changes of osteoarthritis, most pronounced in the medial and patellofemoral compartments.   Electronically Signed   By: Vinnie Langton M.D.   On: 07/26/2014 16:47     EKG Interpretation   Date/Time:  Sunday July 26 2014 17:01:56 EDT Ventricular Rate:  68 PR Interval:  214 QRS Duration: 105 QT Interval:  444 QTC Calculation: 472 R Axis:   -49 Text Interpretation:  Sinus rhythm Borderline prolonged PR interval LAD,  consider left anterior fascicular block RSR' in V1 or V2, right VCD or RVH  Borderline T abnormalities, anterior leads Baseline wander in lead(s) II  V2 V5 Abnormal ekg Since last tracing T wave abnormality now more  prominent in V3 Confirmed by MILLER  MD, BRIAN ([id number]) on 07/26/2014  5:06:37 PM      MDM   Final diagnoses:  Fall  Infarction of parietal lobe    BP 123/62 mmHg  Pulse 64  Temp(Src) 97.5 F (36.4 C) (Oral)  Resp 20  SpO2 95%  I have reviewed nursing notes and vital signs. I personally reviewed the imaging tests through PACS system  I reviewed available ER/hospitalization records thought the EMR     Domenic Moras, PA-C 07/26/14 1929  Noemi Chapel, MD 07/27/14 1225

## 2014-07-26 NOTE — Consult Note (Signed)
Referring Physician: Dr Hosie Poisson    Chief Complaint: fall, stroke on CT brain  HPI:                                                                                                                                         Ariana [name] is an 81 y.o. female with a past medical history that is relevant for HTN, Sjogren's syndrome, dementia, fibromyalgia, chronic back pain, transferred to [facility] for further stroke evaluation and management. Patient was initially brought to [facility] after a fall. Ariana [name] tells me that she had had prior falls, but with this last fall she recalls trying to get up from a couch to go to the bathroom, and then she slipped down onto the ground and was having difficulty getting up. She has to crawl to a phone to call her neighbor who contacted EMS to bring her here. Denies having CP, SOB, palpitations, vertigo, lightheadedness before falling. She denies losing consciousness or abnormal movements while in the floor. Complains of having a HA and legs pain. CT brain was performed in the ED, personally reviewed, and revealed a new 2.5 cm region of relatively well-defined hypoattenuation involving right parietal cortex and subcortical white matter.   Date last known well: unable to determine Time last known well: unable to determine tPA Given: no, out of the window   Past Medical History  Diagnosis Date  . Sjogren's syndrome   . Dry eye syndrome   . Hypertension, benign   . Mitral valve prolapse   . GERD (gastroesophageal reflux disease)   . Diverticulosis of colon   . Irritable bowel syndrome   . Urinary incontinence   . Low back pain syndrome   . Fibromyalgia   . Memory loss   . Anxiety and depression   . History of adverse drug reaction   . Peripheral neuropathy     "both feet and legs"  . Shortness of breath 07/18/11    "alot lately"  . Anginal pain   . History of recurrent UTIs   . H/O hiatal hernia   . Anxiety   . Dementia   . Depression   .  Abnormality of gait   . Thyroid nodule   . Headache(784.0) 09/05/2012  . Adenomatous polyp of colon 2002    56mm    Past Surgical History  Procedure Laterality Date  . Vesicovaginal fistula closure w/ tah    . Appendectomy    . Cholecystectomy  2000  . Mandible surgery    . Temporomandibular joint surgery  1986    Dr. Terence Lux  . Cataract extraction, bilateral    . Abdominal hysterectomy  1967  . Dental surgery      multiple tooth extractions  . Esophagogastroduodenoscopy (egd) with esophageal dilation N/A 08/23/2012    Procedure: ESOPHAGOGASTRODUODENOSCOPY (EGD) WITH ESOPHAGEAL DILATION;  Surgeon: Milus Banister, MD;  Location: [facility]OSCOPY;  Service: Endoscopy;  Laterality: N/A;  . Colonoscopy w/ biopsies      multiple  . Nasal septum surgery  1980  . Transthoracic echocardiogram  2001    mild LVH, normal LV  . Nm myocar perf wall motion  2003    persantine - normal static and dynamic study w/apical thinning and presvered LV function, no ischemia  . Cardiac catheterization  02/17/2003    normal L main, LAD free of disease, Cfx free of disease, RCA free of disease (Dr. Rockne Menghini)    Family History  Problem Relation Age of Onset  . Heart disease Father     heart attack  . Pneumonia Mother   . Heart attack Mother   . Hypertension Mother   . Hypertension Maternal Grandmother   . Colon cancer Sister   . Kidney disease Daughter   . Asthma Daughter   . Arthritis Daughter 64    osteo,  . Heart disease Son 10    stage 3 CHF(Diastolic /Systolic)  . Throat cancer Brother    Social History:  reports that she has quit smoking. She has never used smokeless tobacco. She reports that she does not drink alcohol or use illicit drugs. Family history: no brain tumors, epilepsy, or brain aneurysms Allergies:  Allergies  Allergen Reactions  . Banana Nausea And Vomiting  . Codeine Nausea Only    unless given with Phenergan  . Doxycycline     Unknown  . Klonopin [Clonazepam]      Causes hallucination   . Meperidine Hcl Nausea Only    unless given with Phenergan  . Naproxen   . Norflex [Orphenadrine Citrate] Nausea Only    Unless given with Phenergan  . Oxycodone-Acetaminophen Nausea Only    unless given with phenergan  . Penicillins     Unknown  . Phenothiazines     Unknown  . Propoxyphene Hcl Nausea Only    unless given with phenergan  . Stelazine     Unknown  . Sulfamethoxazole-Trimethoprim     Unknown  . Tolectin [Tolmetin Sodium]     Unknown  . Tramadol     Unknown  . Zoloft [Sertraline Hcl]     Caused pt to sleep a lot    Medications:                                                                                                                           Scheduled: .  stroke: mapping our early stages of recovery book   Does not apply Once  . aspirin  325 mg Oral Daily  . [START ON 07/27/2014] diltiazem  180 mg Oral Daily  . [START ON 07/27/2014] DULoxetine  30 mg Oral Daily  . [START ON 07/27/2014] DULoxetine  60 mg Oral QHS  . enoxaparin (LOVENOX) injection  40 mg Subcutaneous Q24H  . [START ON 07/27/2014] hydroxychloroquine  200 mg Oral Daily  . [START ON 07/27/2014] memantine  28 mg Oral Daily  . [  START ON 07/27/2014] metoprolol  50 mg Oral BID  . pregabalin  75 mg Oral BID    ROS:                                                                                                                                       History obtained from chart review and the patient  General ROS: negative for - chills, fatigue, fever, night sweats, weight gain or weight loss Psychological ROS: negative for - behavioral disorder, hallucinations, or suicidal ideation Ophthalmic ROS: negative for - blurry vision, double vision, eye pain or loss of vision ENT ROS: negative for - epistaxis, nasal discharge, oral lesions, sore throat, tinnitus or vertigo Allergy and Immunology ROS: negative for - hives or itchy/watery eyes Hematological and Lymphatic ROS: negative  for - bleeding problems, bruising or swollen lymph nodes Endocrine ROS: negative for - galactorrhea, hair pattern changes, polydipsia/polyuria or temperature intolerance Respiratory ROS: negative for - cough, hemoptysis, shortness of breath or wheezing Cardiovascular ROS: negative for - chest pain, dyspnea on exertion, edema or irregular heartbeat Gastrointestinal ROS: negative for - abdominal pain, diarrhea, hematemesis, nausea/vomiting or stool incontinence. Complains of constipation Genito-Urinary ROS: negative for - hematuria Musculoskeletal ROS: negative for - joint swelling or muscular weakness Neurological ROS: as noted in HPI Dermatological ROS: negative for rash and skin lesion changes  Physical exam: pleasant female in no apparent distress. Blood pressure 146/60, pulse 65, temperature 98.3 F (36.8 C), temperature source Oral, resp. rate 18, height 5\' 3"  (1.6 m), weight 64 kg (141 lb 1.5 oz), SpO2 95 %. Head: normocephalic. Neck: supple, no bruits, no JVD. Cardiac: no murmurs. Lungs: clear. Abdomen: soft, no tender, no mass. Extremities: no edema. Skin: no rash  Neurologic Examination:                                                                                                      General: Mental Status: Alert, oriented, thought content appropriate.  Speech fluent without evidence of aphasia.  Able to follow 3 step commands without difficulty. Cranial Nerves: II: Discs flat bilaterally; Visual fields grossly normal, pupils equal, round, reactive to light and accommodation III,IV, VI: ptosis not present, extra-ocular motions intact bilaterally V,VII: smile symmetric, facial light touch sensation normal bilaterally VIII: hearing normal bilaterally IX,X: uvula rises symmetrically XI: bilateral shoulder shrug XII: midline tongue extension without atrophy or fasciculations Motor: Moves all limbs symmetrically Tone and bulk:normal tone throughout; no atrophy noted Sensory:  Pinprick and light touch  intact throughout, bilaterally Deep Tendon Reflexes:  1+ all over Plantars: Right: downgoing   Left: downgoing Cerebellar: normal finger-to-nose,  normal heel-to-shin test Gait:  No tested due to multiple leads    Results for orders placed or performed during the hospital encounter of 07/26/14 (from the past 48 hour(s))  Urinalysis, Routine w reflex microscopic (not at [facility])     Status: Abnormal   Collection Time: 07/26/14  3:47 PM  Result Value Ref Range   Color, Urine YELLOW YELLOW   APPearance CLOUDY (A) CLEAR   Specific Gravity, Urine 1.011 1.005 - 1.030   pH 5.0 5.0 - 8.0   Glucose, UA NEGATIVE NEGATIVE mg/dL   Hgb urine dipstick NEGATIVE NEGATIVE   Bilirubin Urine NEGATIVE NEGATIVE   Ketones, ur NEGATIVE NEGATIVE mg/dL   Protein, ur NEGATIVE NEGATIVE mg/dL   Urobilinogen, UA 0.2 0.0 - 1.0 mg/dL   Nitrite NEGATIVE NEGATIVE   Leukocytes, UA MODERATE (A) NEGATIVE  Urine microscopic-add on     Status: Abnormal   Collection Time: 07/26/14  3:47 PM  Result Value Ref Range   Squamous Epithelial / LPF FEW (A) RARE   WBC, UA 3-6 <3 WBC/hpf   Bacteria, UA MANY (A) RARE  CBC with Differential/Platelet     Status: None   Collection Time: 07/26/14  4:35 PM  Result Value Ref Range   WBC 6.5 4.0 - 10.5 K/uL   RBC 4.13 3.87 - 5.11 MIL/uL   Hemoglobin 12.7 12.0 - 15.0 g/dL   HCT 39.0 36.0 - 46.0 %   MCV 94.4 78.0 - 100.0 fL   MCH 30.8 26.0 - 34.0 pg   MCHC 32.6 30.0 - 36.0 g/dL   RDW 13.1 11.5 - 15.5 %   Platelets 217 150 - 400 K/uL   Neutrophils Relative % 65 43 - 77 %   Neutro Abs 4.2 1.7 - 7.7 K/uL   Lymphocytes Relative 22 12 - 46 %   Lymphs Abs 1.4 0.7 - 4.0 K/uL   Monocytes Relative 10 3 - 12 %   Monocytes Absolute 0.7 0.1 - 1.0 K/uL   Eosinophils Relative 3 0 - 5 %   Eosinophils Absolute 0.2 0.0 - 0.7 K/uL   Basophils Relative 0 0 - 1 %   Basophils Absolute 0.0 0.0 - 0.1 K/uL  I-stat troponin, ED     Status: None   Collection Time:  07/26/14  4:41 PM  Result Value Ref Range   Troponin i, poc 0.01 0.00 - 0.08 ng/mL   Comment 3            Comment: Due to the release kinetics of cTnI, a negative result within the first hours of the onset of symptoms does not rule out myocardial infarction with certainty. If myocardial infarction is still suspected, repeat the test at appropriate intervals.   I-stat chem 8, ed     Status: None   Collection Time: 07/26/14  4:42 PM  Result Value Ref Range   Sodium 142 135 - 145 mmol/L   Potassium 4.0 3.5 - 5.1 mmol/L   Chloride 103 101 - 111 mmol/L   BUN 16 6 - 20 mg/dL   Creatinine, Ser 0.70 0.44 - 1.00 mg/dL   Glucose, Bld 99 65 - 99 mg/dL   Calcium, Ion 1.22 1.13 - 1.30 mmol/L   TCO2 25 0 - 100 mmol/L   Hemoglobin 14.3 12.0 - 15.0 g/dL   HCT 42.0 36.0 - 46.0 %   Dg Cervical Spine Complete  07/26/2014  CLINICAL DATA:  81 year old female with history of fall earlier today complaining of neck and back pain.  EXAM: CERVICAL SPINE  4+ VIEWS  COMPARISON:  No priors.  FINDINGS: Multiple views of the cervical spine demonstrate no acute displaced fracture. Alignment is anatomic. Prevertebral soft tissues are normal. Severe multilevel degenerative disc disease is noted, most pronounced at C4-C5, C5-C6 and C6-C7. Multilevel facet arthropathy. Edentulous patient.  IMPRESSION: 1. No acute radiographic abnormality of the cervical spine. 2. Severe multilevel degenerative disc disease and cervical spondylosis, as above.   Electronically Signed   By: Vinnie Langton M.D.   On: 07/26/2014 16:45   Dg Lumbar Spine Complete  07/26/2014   CLINICAL DATA:  Slip at home while getting out of chair today. Low back pain. Initial encounter.  EXAM: LUMBAR SPINE - COMPLETE 4+ VIEW  COMPARISON:  Abdominal radiographs 05/15/2014. CT abdomen and pelvis 07/20/2013.  FINDINGS: There are 5 non rib-bearing lumbar type vertebral bodies. Lumbar vertebral alignment is within normal limits. Lumbar vertebral body heights are  preserved without evidence of compression fracture. Severe disc space height loss is again seen at L5-S1 with endplate sclerosis and osteophytosis. Intervertebral disc space heights are preserved elsewhere in the lumbar spine. Mild degenerative changes are noted in the lower thoracic spine. No pars defects are identified. Facet arthrosis is noted L5-S1. Right upper quadrant abdominal surgical clips are noted.  IMPRESSION: No acute osseous abnormality identified.  L5-S1 disc degeneration.   Electronically Signed   By: Logan Bores   On: 07/26/2014 16:50   Ct Head Wo Contrast  07/26/2014   CLINICAL DATA:  Mechanical fall upon getting up from couch. Initial encounter.  EXAM: CT HEAD WITHOUT CONTRAST  TECHNIQUE: Contiguous axial images were obtained from the base of the skull through the vertex without intravenous contrast.  COMPARISON:  07/20/2013  FINDINGS: There is a new 2.5 cm region of relatively well-defined hypoattenuation involving right parietal cortex and subcortical white matter (series 2, image 19). A small, chronic infarct is again seen in the right MCA territory more anteriorly involving the anterior insula and inferior frontal lobe. There is mild global cerebral atrophy. Patchy periventricular white matter hypodensities elsewhere in both cerebral hemispheres are similar to the prior study and nonspecific but compatible with mild chronic small vessel ischemic disease. There is no evidence of acute intracranial hemorrhage, mass, midline shift, or extra-axial fluid collection.  Prior bilateral cataract extraction is noted. 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 Prior right temporal fixation. No acute skull fracture is identified.  IMPRESSION: 1. No acute intracranial hemorrhage. 2. 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   Electronically Signed   By: Logan Bores   On: 07/26/2014 16:06   Dg Knee Complete 4 Views Left  07/26/2014   CLINICAL DATA:   81 year old female with history of trauma from a fall earlier today complaining of left knee pain.  EXAM: LEFT KNEE - COMPLETE 4+ VIEW  COMPARISON:  No priors.  FINDINGS: Small suprapatellar effusion. No acute displaced fracture, subluxation or dislocation. Mild joint space narrowing, subchondral sclerosis and osteophyte formation, most evident in the medial and patellofemoral compartments.  IMPRESSION: 1. Small suprapatellar effusion. 2. Negative for fracture. 3. Mild degenerative changes of osteoarthritis, most pronounced in the medial and patellofemoral compartments.   Electronically Signed   By: Vinnie Langton M.D.   On: 07/26/2014 16:47     Assessment: 81 y.o. female with recurrent falls, dementia, HTN, and CT brain with findings suggestive of  a small subacute to chronic infarct involving right parietal cortex and subcortical white matter.  Patient has underlying dementia with recurrent falls, and I am not quite convinced that her fall today is necessarily secondary to the CT findings. Will recommend competing stroke work as delineated below.  Stroke Risk Factors -age,  HTN,  Plan: 1. HgbA1c, fasting lipid panel 2. MRI, MRA  of the brain without contrast 3. Carotid dopplers 4. Prophylactic therapy-aspirin after passing swallowing evaluation. 6. Risk factor modification 7. Telemetry monitoring 8. Frequent neuro checks 9. PT/OT SLP   Dorian Pod, MD [facility] [phone]  07/26/2014, 10:16 PM

## 2014-07-26 NOTE — ED Provider Notes (Signed)
The patient is an 81 year old female, she has a history of dementia, she also sees cardiologist and was recently started on pain medications as well as some diarrhea medications For Constipation Caused by the Opiates. She Has Had Some Imbalance and Has Had 2 Falls This Week, Today She [place], She Is Unable to Give a Very Clear History As She Does Have Some Dementia. On Exam the Patient Is Able to Move All 4 Extremities, She Has Normal Strength, Follows Commands without Difficulty, No Discoordination, Normal Speech, Memory Is Impaired at Baseline per the Daughter. Her CT Scan Was reviewed by myself, I agree with the interpretation of the radiologist that there is a new parietal infarct on the right. This was Medicated to the patient and her family member, she will be admitted to the hospital for further stroke workup, physical therapy evaluation and potential placement if she is unstable at home.   EKG Interpretation  Date/Time:  Sunday July 26 2014 17:01:56 EDT Ventricular Rate:  68 PR Interval:  214 QRS Duration: 105 QT Interval:  444 QTC Calculation: 472 R Axis:   -49 Text Interpretation:  Sinus rhythm Borderline prolonged PR interval LAD, consider left anterior fascicular block RSR' in V1 or V2, right VCD or RVH Borderline T abnormalities, anterior leads Baseline wander in lead(s) II V2 V5 Abnormal ekg Since last tracing T wave abnormality now more prominent in V3 Confirmed by Kristen Fromm  MD, Shwanda Soltis ([id number]) on 07/26/2014 5:06:37 PM       Medical screening examination/treatment/procedure(s) were conducted as a shared visit with non-physician practitioner(s) and myself.  I personally evaluated the patient during the encounter.  Clinical Impression:   Final diagnoses:  Fall  Infarction of parietal lobe         Noemi Chapel, MD 07/27/14 1225

## 2014-07-26 NOTE — ED Notes (Signed)
Pt has created the password "[email or web address]" to enable her daughter access to all PHI.

## 2014-07-26 NOTE — ED Notes (Signed)
Bed: WA13 Expected date:  Expected time:  Means of arrival:  Comments: ems  

## 2014-07-26 NOTE — ED Notes (Signed)
Per EMS: pt had mechanical fall upon getting up from couch, pt c/o left leg, lower back pain and abd tenderness. Pt has no deformities noted.

## 2014-07-26 NOTE — H&P (Signed)
[facility] History and Physical  Ariana [name] PYK:[id number] DOB: 04-29-1929 DOA: 07/26/2014  Referring physician: EDP PCP: Hollace Kinnier, DO   Chief Complaint: brought in by her daughter after a fall.   HPI: Ariana [name] is a 81 y.o. female with h/o fibromyalgia, chornic back pain, hypertension, brought in by her family family after a fall. She reports falling since last 2 days multiple times, . She denies any dizziness, chest pain, sob , palpitations, prior to the fall. She denies any other complaints. Evaluation in the ED with X ray of the cervical spine, X ray of the knee, lumbar spine does not show any fractures. Initial CT head shows new small right parietal infarct. She was referred to medical service for further evaluation. Neurology was consulted by EDP and she is being transferred to [facility] for evaluation of stroke.    Review of Systems:  Constitutional:  No weight loss, night sweats, Fevers, chills, fatigue.  HEENT:  No headaches, Difficulty swallowing,Tooth/dental problems,Sore throat,  No sneezing, itching, ear ache, nasal congestion, post nasal drip,  Cardio-vascular:  No chest pain, Orthopnea, PND, swelling in lower extremities, anasarca, dizziness, palpitations  GI:  No heartburn, indigestion, abdominal pain, nausea, vomiting, diarrhea, change in bowel habits, loss of appetite  Resp:  No shortness of breath with exertion or at rest. No excess mucus, no productive cough, No non-productive cough, No coughing up of blood.No change in color of mucus.No wheezing.No chest wall deformity  Skin:  no rash or lesions.  GU:  no dysuria, change in color of urine, no urgency or frequency. No flank pain.  Musculoskeletal:  No joint pain or swelling. No decreased range of motion. No back pain.  Psych:  No change in mood or affect. No depression or anxiety. No memory loss.   Past Medical History  Diagnosis Date  . Sjogren's syndrome   . Dry eye syndrome   .  Hypertension, benign   . Mitral valve prolapse   . GERD (gastroesophageal reflux disease)   . Diverticulosis of colon   . Irritable bowel syndrome   . Urinary incontinence   . Low back pain syndrome   . Fibromyalgia   . Memory loss   . Anxiety and depression   . History of adverse drug reaction   . Peripheral neuropathy     "both feet and legs"  . Shortness of breath 07/18/11    "alot lately"  . Anginal pain   . History of recurrent UTIs   . H/O hiatal hernia   . Anxiety   . Dementia   . Depression   . Abnormality of gait   . Thyroid nodule   . Headache(784.0) 09/05/2012  . Adenomatous polyp of colon 2002    41mm   Past Surgical History  Procedure Laterality Date  . Vesicovaginal fistula closure w/ tah    . Appendectomy    . Cholecystectomy  2000  . Mandible surgery    . Temporomandibular joint surgery  1986    Dr. Terence Lux  . Cataract extraction, bilateral    . Abdominal hysterectomy  1967  . Dental surgery      multiple tooth extractions  . Esophagogastroduodenoscopy (egd) with esophageal dilation N/A 08/23/2012    Procedure: ESOPHAGOGASTRODUODENOSCOPY (EGD) WITH ESOPHAGEAL DILATION;  Surgeon: Milus Banister, MD;  Location: [facility];  Service: Endoscopy;  Laterality: N/A;  . Colonoscopy w/ biopsies      multiple  . Nasal septum surgery  1980  . Transthoracic echocardiogram  2001    mild LVH, normal LV  . Nm myocar perf wall motion  2003    persantine - normal static and dynamic study w/apical thinning and presvered LV function, no ischemia  . Cardiac catheterization  02/17/2003    normal L main, LAD free of disease, Cfx free of disease, RCA free of disease (Dr. Rockne Menghini)   Social History:  reports that she has quit smoking. She has never used smokeless tobacco. She reports that she does not drink alcohol or use illicit drugs.  Allergies  Allergen Reactions  . Banana Nausea And Vomiting  . Codeine Nausea Only    unless given with Phenergan  . Doxycycline      Unknown  . Klonopin [Clonazepam]     Causes hallucination   . Meperidine Hcl Nausea Only    unless given with Phenergan  . Naproxen   . Norflex [Orphenadrine Citrate] Nausea Only    Unless given with Phenergan  . Oxycodone-Acetaminophen Nausea Only    unless given with phenergan  . Penicillins     Unknown  . Phenothiazines     Unknown  . Propoxyphene Hcl Nausea Only    unless given with phenergan  . Stelazine     Unknown  . Sulfamethoxazole-Trimethoprim     Unknown  . Tolectin [Tolmetin Sodium]     Unknown  . Tramadol     Unknown  . Zoloft [Sertraline Hcl]     Caused pt to sleep a lot    Family History  Problem Relation Age of Onset  . Heart disease Father     heart attack  . Pneumonia Mother   . Heart attack Mother   . Hypertension Mother   . Hypertension Maternal Grandmother   . Colon cancer Sister   . Kidney disease Daughter   . Asthma Daughter   . Arthritis Daughter 81    osteo,  . Heart disease Son 78    stage 3 CHF(Diastolic /Systolic)  . Throat cancer Brother     Prior to Admission medications   Medication Sig Start Date End Date Taking? Authorizing Provider  ALPRAZolam Duanne Moron) 0.5 MG tablet Take one tablet by mouth twice daily for anxiety 07/10/14  Yes Tiffany L Reed, DO  aspirin EC 81 MG tablet Take 81 mg by mouth daily.   Yes Historical Provider, MD  bisacodyl (BISACODYL) 5 MG EC tablet Take 2 tablets (10 mg total) by mouth daily as needed for moderate constipation. If no effective bowel movement every 2- 3 days Patient taking differently: Take 5-10 mg by mouth daily as needed for moderate constipation. If no effective bowel movement every 2- 3 days 07/08/14  Yes Gatha Mayer, MD  CALCIUM CARBONATE PO Take 1 tablet by mouth daily.   Yes Historical Provider, MD  carboxymethylcellulose (REFRESH TEARS) 0.5 % SOLN 1 drop 2 (two) times daily as needed (dry eyes).    Yes Historical Provider, MD  cetirizine (ZYRTEC) 10 MG tablet Take 1 tablet (10 mg total)  by mouth daily. 03/23/14  Yes Tiffany L Reed, DO  CRANBERRY SOFT PO Take 1 capsule by mouth daily.   Yes Historical Provider, MD  diltiazem (CARDIZEM CD) 180 MG 24 hr capsule Take 1 capsule (180 mg total) by mouth daily. 07/03/14  Yes Pixie Casino, MD  DULoxetine (CYMBALTA) 30 MG capsule Take one tablet  once daily in the morning for depression 06/12/14  Yes Tiffany L Reed, DO  DULoxetine (CYMBALTA) 60 MG capsule Take one tablet by  mouth in the evening once daily for depression 06/12/14  Yes Tiffany L Reed, DO  HYDROcodone-acetaminophen (NORCO/VICODIN) 5-325 MG per tablet Take 1 tablet by mouth 2 (two) times daily as needed for moderate pain.    Yes Historical Provider, MD  hydroxychloroquine (PLAQUENIL) 200 MG tablet Take 200 mg by mouth daily.    Yes Historical Provider, MD  lactose free nutrition (BOOST PLUS) LIQD Take 237 mLs by mouth 2 (two) times daily between meals. 07/22/13  Yes Maryann Mikhail, DO  lansoprazole (PREVACID) 30 MG capsule Take 1 capsule (30 mg total) by mouth daily at 12 noon. 01/12/14  Yes Tiffany L Reed, DO  lubiprostone (AMITIZA) 24 MCG capsule Take 1 capsule (24 mcg total) by mouth 2 (two) times daily with a meal. 07/08/14  Yes Gatha Mayer, MD  metoprolol (LOPRESSOR) 50 MG tablet Take 1 tablet (50 mg total) by mouth 2 (two) times daily. 07/03/14  Yes Pixie Casino, MD  mirabegron ER (MYRBETRIQ) 50 MG TB24 tablet Take 1 tablet (50 mg total) by mouth daily. Take one tablet daily for bladder 05/11/14  Yes Tiffany L Reed, DO  NAMENDA XR 28 MG CP24 24 hr capsule TAKE ONE CAPSULE BY MOUTH EVERY DAY TO PRESERVE MEMORY 07/08/14  Yes Tiffany L Reed, DO  nystatin (MYCOSTATIN) 100000 UNIT/ML suspension TAKE 1 TEASPOONFUL BY MOUTH 4 TIMES A DAY Patient taking differently: TAKE 1 TEASPOONFUL BY MOUTH 4 TIMES A DAY as needed for mouth pain 02/09/14  Yes Tiffany L Reed, DO  pilocarpine (SALAGEN) 5 MG tablet Take 5 mg by mouth 2 (two) times daily.   Yes Historical Provider, MD  pregabalin  (LYRICA) 75 MG capsule Take 1 capsule (75 mg total) by mouth 2 (two) times daily. 03/23/14  Yes Tiffany L Reed, DO  sodium chloride (OCEAN) 0.65 % SOLN nasal spray Place 1 spray into the nose as needed for congestion. 08/23/12  Yes Modena Jansky, MD  triamcinolone ointment (KENALOG) 0.1 % Apply 1 application topically 2 (two) times daily as needed (rash).  04/01/14  Yes Historical Provider, MD   Physical Exam: Filed Vitals:   07/26/14 1341 07/26/14 1921 07/26/14 1930 07/26/14 2000  BP: 123/62  148/72 122/59  Pulse: 64  65   Temp: 97.5 F (36.4 C) 98.4 F (36.9 C)    TempSrc: Oral Oral    Resp: 20  12 16   SpO2: 95%  97%     Wt Readings from Last 3 Encounters:  07/08/14 64.071 kg (141 lb 4 oz)  07/03/14 64.638 kg (142 lb 8 oz)  06/12/14 63.504 kg (140 lb)    General:  Appears calm and comfortable Eyes: PERRL, normal lids, irises & conjunctiva Neck: no LAD, masses or thyromegaly Cardiovascular: RRR, no m/r/g. No LE edema Respiratory: CTA bilaterally, no w/r/r. Normal respiratory effort. Abdomen: soft, ntnd Skin: no rash or induration seen on limited exam Musculoskeletal: grossly normal tone BUE/BLE Neurologic: normal speech, able to move all extremities. .          Labs on Admission:  Basic Metabolic Panel:  Recent Labs Lab 07/26/14 1642  NA 142  K 4.0  CL 103  GLUCOSE 99  BUN 16  CREATININE 0.70   Liver Function Tests: No results for input(s): AST, ALT, ALKPHOS, BILITOT, PROT, ALBUMIN in the last 168 hours. No results for input(s): LIPASE, AMYLASE in the last 168 hours. No results for input(s): AMMONIA in the last 168 hours. CBC:  Recent Labs Lab 07/26/14 1635 07/26/14 1642  WBC  6.5  --   NEUTROABS 4.2  --   HGB 12.7 14.3  HCT 39.0 42.0  MCV 94.4  --   PLT 217  --    Cardiac Enzymes: No results for input(s): CKTOTAL, CKMB, CKMBINDEX, TROPONINI in the last 168 hours.  BNP (last 3 results) No results for input(s): BNP in the last 8760 hours.  ProBNP  (last 3 results) No results for input(s): PROBNP in the last 8760 hours.  CBG: No results for input(s): GLUCAP in the last 168 hours.  Radiological Exams on Admission: Dg Cervical Spine Complete  07/26/2014   CLINICAL DATA:  81 year old female with history of fall earlier today complaining of neck and back pain.  EXAM: CERVICAL SPINE  4+ VIEWS  COMPARISON:  No priors.  FINDINGS: Multiple views of the cervical spine demonstrate no acute displaced fracture. Alignment is anatomic. Prevertebral soft tissues are normal. Severe multilevel degenerative disc disease is noted, most pronounced at C4-C5, C5-C6 and C6-C7. Multilevel facet arthropathy. Edentulous patient.  IMPRESSION: 1. No acute radiographic abnormality of the cervical spine. 2. Severe multilevel degenerative disc disease and cervical spondylosis, as above.   Electronically Signed   By: Vinnie Langton M.D.   On: 07/26/2014 16:45   Dg Lumbar Spine Complete  07/26/2014   CLINICAL DATA:  Slip at home while getting out of chair today. Low back pain. Initial encounter.  EXAM: LUMBAR SPINE - COMPLETE 4+ VIEW  COMPARISON:  Abdominal radiographs 05/15/2014. CT abdomen and pelvis 07/20/2013.  FINDINGS: There are 5 non rib-bearing lumbar type vertebral bodies. Lumbar vertebral alignment is within normal limits. Lumbar vertebral body heights are preserved without evidence of compression fracture. Severe disc space height loss is again seen at L5-S1 with endplate sclerosis and osteophytosis. Intervertebral disc space heights are preserved elsewhere in the lumbar spine. Mild degenerative changes are noted in the lower thoracic spine. No pars defects are identified. Facet arthrosis is noted L5-S1. Right upper quadrant abdominal surgical clips are noted.  IMPRESSION: No acute osseous abnormality identified.  L5-S1 disc degeneration.   Electronically Signed   By: Logan Bores   On: 07/26/2014 16:50   Ct Head Wo Contrast  07/26/2014   CLINICAL DATA:  Mechanical  fall upon getting up from couch. Initial encounter.  EXAM: CT HEAD WITHOUT CONTRAST  TECHNIQUE: Contiguous axial images were obtained from the base of the skull through the vertex without intravenous contrast.  COMPARISON:  07/20/2013  FINDINGS: There is a new 2.5 cm region of relatively well-defined hypoattenuation involving right parietal cortex and subcortical white matter (series 2, image 19). A small, chronic infarct is again seen in the right MCA territory more anteriorly involving the anterior insula and inferior frontal lobe. There is mild global cerebral atrophy. Patchy periventricular white matter hypodensities elsewhere in both cerebral hemispheres are similar to the prior study and nonspecific but compatible with mild chronic small vessel ischemic disease. There is no evidence of acute intracranial hemorrhage, mass, midline shift, or extra-axial fluid collection.  Prior bilateral cataract extraction is noted. 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 Prior right temporal fixation. No acute skull fracture is identified.  IMPRESSION: 1. No acute intracranial hemorrhage. 2. 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   Electronically Signed   By: Logan Bores   On: 07/26/2014 16:06   Dg Knee Complete 4 Views Left  07/26/2014   CLINICAL DATA:  81 year old female with history of trauma from a fall earlier today complaining of left knee pain.  EXAM: LEFT KNEE - COMPLETE 4+ VIEW  COMPARISON:  No priors.  FINDINGS: Small suprapatellar effusion. No acute displaced fracture, subluxation or dislocation. Mild joint space narrowing, subchondral sclerosis and osteophyte formation, most evident in the medial and patellofemoral compartments.  IMPRESSION: 1. Small suprapatellar effusion. 2. Negative for fracture. 3. Mild degenerative changes of osteoarthritis, most pronounced in the medial and patellofemoral compartments.   Electronically Signed   By: Vinnie Langton M.D.   On:  07/26/2014 16:47    EKG: sinus rhythm at 68/min, with borderline prolonged PR interval.   Assessment/Plan Active Problems:   CVA (cerebral infarction)   Multiple falls: With CT showing features of sub acute cva: Admit to telemetry at [place].  Start pt on aspirin 325 mg daily .  Stroke work up including MRI Brain, MRA head and neck, carotid duplex and echocardiogram.  Neurology consulted and will see the pt today.    Hypertension: Controlled.  Allow permissive hypertension.   Code Status: full code.  DVT Prophylaxis: Family Communication: daughter at bedside. Disposition Plan: transfer to [facility]/tele  Time spent: 60 min  [facility] Pager [phone]

## 2014-07-27 ENCOUNTER — Inpatient Hospital Stay (HOSPITAL_BASED_OUTPATIENT_CLINIC_OR_DEPARTMENT_OTHER): Payer: Medicare Other

## 2014-07-27 ENCOUNTER — Inpatient Hospital Stay (HOSPITAL_COMMUNITY): Payer: Medicare Other

## 2014-07-27 DIAGNOSIS — F03A0 Unspecified dementia, mild, without behavioral disturbance, psychotic disturbance, mood disturbance, and anxiety: Secondary | ICD-10-CM | POA: Insufficient documentation

## 2014-07-27 DIAGNOSIS — I6789 Other cerebrovascular disease: Secondary | ICD-10-CM

## 2014-07-27 DIAGNOSIS — W19XXXD Unspecified fall, subsequent encounter: Secondary | ICD-10-CM

## 2014-07-27 DIAGNOSIS — I6389 Other cerebral infarction: Secondary | ICD-10-CM | POA: Insufficient documentation

## 2014-07-27 DIAGNOSIS — I639 Cerebral infarction, unspecified: Secondary | ICD-10-CM | POA: Diagnosis not present

## 2014-07-27 DIAGNOSIS — F0390 Unspecified dementia without behavioral disturbance: Secondary | ICD-10-CM

## 2014-07-27 DIAGNOSIS — I6350 Cerebral infarction due to unspecified occlusion or stenosis of unspecified cerebral artery: Secondary | ICD-10-CM | POA: Diagnosis not present

## 2014-07-27 DIAGNOSIS — I63411 Cerebral infarction due to embolism of right middle cerebral artery: Secondary | ICD-10-CM

## 2014-07-27 DIAGNOSIS — W19XXXA Unspecified fall, initial encounter: Secondary | ICD-10-CM | POA: Insufficient documentation

## 2014-07-27 LAB — LIPID PANEL
Cholesterol: 157 mg/dL (ref 0–200)
HDL: 48 mg/dL (ref >40)
LDL Cholesterol: 97 mg/dL (ref 0–99)
Total CHOL/HDL Ratio: 3.3 RATIO
Triglycerides: 58 mg/dL (ref <150)
VLDL: 12 mg/dL (ref 0–40)

## 2014-07-27 MED ORDER — PANTOPRAZOLE SODIUM 40 MG PO TBEC
40.0000 mg | DELAYED_RELEASE_TABLET | Freq: Every day | ORAL | Status: DC
  Administered 2014-07-27: 40 mg via ORAL
  Filled 2014-07-27: qty 1

## 2014-07-27 MED ORDER — FAMOTIDINE 20 MG PO TABS: 20.0000 mg | ORAL_TABLET | Freq: Two times a day (BID) | ORAL | Status: DC | PRN

## 2014-07-27 MED ORDER — PRAVASTATIN SODIUM 20 MG PO TABS
20.0000 mg | ORAL_TABLET | Freq: Every day | ORAL | Status: DC
  Administered 2014-07-27 – 2014-07-28 (×2): 20 mg via ORAL
  Filled 2014-07-27 (×2): qty 1

## 2014-07-27 NOTE — Evaluation (Signed)
Physical Therapy Evaluation Patient Details Name: Ariana [name] MRN: [id number] DOB: August 05, 1929 Today's Date: 07/27/2014   History of Present Illness  81 yo female s/p fall from couch. Pt with x3 days of falls. MRI (+) Subacute to chronic RIGHT parietal lobe infarct, MCA territory. Old RIGHT frontal/insula encephalomalacia (MCA territory) PMH: fibromyalgias, chronic back pain, HTN, dementia, abnormal gait, Sjogren's syndrome.  Clinical Impression  Pt admitted with above diagnosis. Pt currently with functional limitations due to the deficits listed below (see PT Problem List). Pt ambulated 60 ft using RW w/ min guard but required max VCs to keep RW closer to her body and to keep it with her during sit<>stand transfers to toilet and recliner chair. Pt will benefit from skilled PT to increase their independence and safety with mobility to allow discharge to the venue listed below. Recommending d/c to SNF at this time 2/2 pt's h/o falls and no assist at home from 5pm-9am.  Not sure how receptive pt will be to this d/c plan but believe it is the safest option for the pt at this time.    Follow Up Recommendations SNF;Supervision for mobility/OOB    Equipment Recommendations  None recommended by PT    Recommendations for Other Services       Precautions / Restrictions Precautions Precautions: Fall Restrictions Weight Bearing Restrictions: No      Mobility  Bed Mobility               General bed mobility comments: in recliner  Transfers Overall transfer level: Needs assistance Equipment used: Rolling walker (2 wheeled) Transfers: Sit to/from Stand Sit to Stand: Min guard         General transfer comment: Min guard for safety.  Pt required max VCs for hand placement during sit<>stand transfers.  Pt required multiple reminders to bring RW with her to the chair when she is backing up to sit down and cues to reach back for arm rests when  sitting.  Ambulation/Gait Ambulation/Gait assistance: Min guard Ambulation Distance (Feet): 60 Feet Assistive device: Rolling walker (2 wheeled)     Gait velocity interpretation: Below normal speed for age/gender General Gait Details: trunk flexed, cues to keep RW closer to body and to stand upright.  Pt unable to walk and talk simultaneously.  Stairs            Wheelchair Mobility    Modified Rankin (Stroke Patients Only) Modified Rankin (Stroke Patients Only) Pre-Morbid Rankin Score: Moderate disability Modified Rankin: Moderately severe disability     Balance Overall balance assessment: Needs assistance;History of Falls Sitting-balance support: Bilateral upper extremity supported;Feet supported Sitting balance-Leahy Scale: Good     Standing balance support: Bilateral upper extremity supported;During functional activity Standing balance-Leahy Scale: Fair Standing balance comment: Pt able to maintain balance while wiping after using BSC.                             Pertinent Vitals/Pain Pain Assessment: Faces Faces Pain Scale: No hurt Pain Intervention(s): Limited activity within patient's tolerance;Monitored during session    [place] expects to be discharged to:: Private residence Living Arrangements: Alone Available Help at Discharge: Friend(s);Personal care attendant;Available PRN/intermittently;Family (Aide from 9am-5pm) Type of Home: Apartment Home Access: Level entry     Home Layout: One level Home Equipment: Walker - 4 wheels;Cane - single point;Shower seat;Grab bars - tub/shower;Bedside commode      Prior Function Level of Independence: Needs assistance  Gait / Transfers Assistance Needed: Pt says she amb Ind w/ cane and RW; unclear on when pt uses cane vs. RW, pt has hard time answering this question  ADL's / Homemaking Assistance Needed: Pt reports having aide 9a-5p M-F to A with meals, housework and tub  transfers        Hand Dominance   Dominant Hand: Right    Extremity/Trunk Assessment   Upper Extremity Assessment: Defer to OT evaluation           Lower Extremity Assessment: Generalized weakness;RLE deficits/detail;LLE deficits/detail         Communication   Communication: No difficulties  Cognition Arousal/Alertness: Awake/alert (not oriented to time) Behavior During Therapy: WFL for tasks assessed/performed Overall Cognitive Status: No family/caregiver present to determine baseline cognitive functioning                      General Comments      Exercises General Exercises - Lower Extremity Ankle Circles/Pumps: AROM;Both;10 reps;Supine Heel Slides: AROM;Both;10 reps;Supine      Assessment/Plan    PT Assessment Patient needs continued PT services  PT Diagnosis Difficulty walking;Generalized weakness   PT Problem List Decreased strength;Decreased activity tolerance;Decreased balance;Decreased mobility;Decreased coordination;Decreased cognition;Decreased knowledge of use of DME;Decreased safety awareness;Decreased knowledge of precautions;Impaired sensation  PT Treatment Interventions DME instruction;Gait training;Functional mobility training;Stair training;Therapeutic activities;Therapeutic exercise;Balance training;Neuromuscular re-education;Cognitive remediation;Patient/family education;Modalities   PT Goals (Current goals can be found in the Care Plan section) Acute Rehab PT Goals Patient Stated Goal: none stated PT Goal Formulation: With patient Time For Goal Achievement: 08/03/14 Potential to Achieve Goals: Good    Frequency Min 3X/week   Barriers to discharge Decreased caregiver support No assist from 5pm-9am    Co-evaluation               End of Session Equipment Utilized During Treatment: Gait belt Activity Tolerance: Patient tolerated treatment well Patient left: in chair;with call bell/phone within reach;with chair alarm  set Nurse Communication: Mobility status;Precautions         Time: 7353-2992 PT Time Calculation (min) (ACUTE ONLY): 30 min   Charges:   PT Evaluation $Initial PT Evaluation Tier I: 1 Procedure PT Treatments $Gait Training: 8-22 mins   PT G CodesJoslyn Hy PT, DPT [phone] Pager: [phone] 07/27/2014, 11:58 AM

## 2014-07-27 NOTE — Progress Notes (Signed)
STROKE TEAM PROGRESS NOTE   HISTORY Ariana [name] is an 81 y.o. female with a past medical history that is relevant for HTN, Sjogren's syndrome, dementia, fibromyalgia, chronic back pain, transferred to [facility] for further stroke evaluation and management. Patient was initially brought to [facility] after a fall. Ariana [name] tells me that she had had prior falls, but with this last fall she recalls trying to get up from a couch to go to the bathroom, and then she slipped down onto the ground and was having difficulty getting up. She has to crawl to a phone to call her neighbor who contacted EMS to bring her here. Denies having CP, SOB, palpitations, vertigo, lightheadedness before falling. She denies losing consciousness or abnormal movements while in the floor. Complains of having a HA and legs pain. CT brain was performed in the ED and revealed a new 2.5 cm region of relatively well-defined hypoattenuation involving right parietal cortex and subcortical white matter. Last known well unable to determine. Patient was not administered TPA secondary to out of the window. She was admitted to the neuro ICU for further evaluation and treatment.   SUBJECTIVE (INTERVAL HISTORY) No family is at the bedside.  Overall she feels her condition is stable. No new neuro deficits. While she has a hx of dementia, her memory is pretty good on currently events and situation. She denied any focal neurological symptoms and stated that she's had multiple falls in the past and this time she fell and had trouble getting up. She has atrial fibrillation but has not been on anticoagulation presumably for prior falls and dementia   OBJECTIVE Temp:  [97.5 F (36.4 C)-98.4 F (36.9 C)] 97.7 F (36.5 C) (06/13 1012) Pulse Rate:  [54-78] 78 (06/13 1012) Cardiac Rhythm:  [-] Sinus bradycardia (06/12 2136) Resp:  [12-20] 16 (06/13 1012) BP: (105-148)/(45-72) 125/45 mmHg (06/13 1012) SpO2:  [95 %-100 %] 96 % (06/13 1012) Weight:  [64  kg (141 lb 1.5 oz)] 64 kg (141 lb 1.5 oz) (06/12 2136)  No results for input(s): GLUCAP in the last 168 hours.  Recent Labs Lab 07/26/14 1642  NA 142  K 4.0  CL 103  GLUCOSE 99  BUN 16  CREATININE 0.70   No results for input(s): AST, ALT, ALKPHOS, BILITOT, PROT, ALBUMIN in the last 168 hours.  Recent Labs Lab 07/26/14 1635 07/26/14 1642  WBC 6.5  --   NEUTROABS 4.2  --   HGB 12.7 14.3  HCT 39.0 42.0  MCV 94.4  --   PLT 217  --    No results for input(s): CKTOTAL, CKMB, CKMBINDEX, TROPONINI in the last 168 hours. No results for input(s): LABPROT, INR in the last 72 hours.  Recent Labs  07/26/14 1547  COLORURINE YELLOW  LABSPEC 1.011  PHURINE 5.0  GLUCOSEU NEGATIVE  HGBUR NEGATIVE  BILIRUBINUR NEGATIVE  KETONESUR NEGATIVE  PROTEINUR NEGATIVE  UROBILINOGEN 0.2  NITRITE NEGATIVE  LEUKOCYTESUR MODERATE*       Component Value Date/Time   CHOL 157 07/27/2014 0332   TRIG 58 07/27/2014 0332   HDL 48 07/27/2014 0332   CHOLHDL 3.3 07/27/2014 0332   VLDL 12 07/27/2014 0332   LDLCALC 97 07/27/2014 0332   Lab Results  Component Value Date   HGBA1C 5.7 05/16/2012      Component Value Date/Time   LABOPIA NONE DETECTED 07/19/2011 0022   COCAINSCRNUR NONE DETECTED 07/19/2011 0022   LABBENZ NONE DETECTED 07/19/2011 0022   AMPHETMU NONE DETECTED 07/19/2011 0022  THCU NONE DETECTED 07/19/2011 0022   LABBARB NONE DETECTED 07/19/2011 0022    No results for input(s): ETH in the last 168 hours.  Dg Cervical Spine Complete 07/26/2014   1. No acute radiographic abnormality of the cervical spine. 2. Severe multilevel degenerative disc disease and cervical spondylosis  Dg Lumbar Spine Complete 07/26/2014    No acute osseous abnormality identified.  L5-S1 disc degeneration.     Ct Head Wo Contrast 07/26/2014   1. No acute intracranial hemorrhage. 2. 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     Dg Knee Complete 4 Views Left 07/26/2014   1. Small  suprapatellar effusion. 2. Negative for fracture. 3. Mild degenerative changes of osteoarthritis, most pronounced in the medial and patellofemoral compartments.     MRI HEAD 07/27/2014   No acute intracranial process, specifically no acute ischemia.  Subacute to chronic RIGHT parietal lobe infarct, MCA territory. Old RIGHT frontal/insula encephalomalacia (MCA territory).  Mild to moderate white matter changes compatible with chronic small vessel ischemic disease.  Suspected LEFT frontal developmental venous anomaly could be confirmed on contrast-enhanced sequences as clinically indicated.    MRA HEAD 07/27/2014   Moderately motion degraded examination without large vessel occlusion.  Moderate stenosis suspected of LEFT M1 origin. Diminutive RIGHT anterior cerebral artery likely on developmental basis.       PHYSICAL EXAM Frail elderly caucasian lady not in distress. . Afebrile. Head is nontraumatic. Neck is supple without bruit.    Cardiac exam no murmur or gallop. Lungs are clear to auscultation. Distal pulses are well felt. Neurological Exam :  Awake alert oriented 3. Slightly diminished attention and recall. Follows and occasional two-step commands. Able to name 7 animals in 1 minute. She can tell me the name of the current [place] Pres. Speech is clear without dysarthria or aphasia. Extraocular movements are pharyngeal or nystagmus. Fundi were not visualized. Vision acuity and fields appear adequate. Face is symmetric without weakness. Tongue is midline. Motor system exam revealed no upper or lower extremity drift. Symmetric and equal strength in all 4 extremities. No focal weakness. Touch pinprick sensation appear preserved bilaterally. Deep tendon reflexes are symmetric. Plantars are downgoing bilaterally. Gait was not tested. ASSESSMENT/PLAN Ariana [name] is a 81 y.o. female with history of HTN, Sjogren's syndrome, dementia, fibromyalgia, chronic back pain presenting to [facility] following a fall. She  did not arrive within t-PA window.    Fall. No acute stroke. Old R MCA infarct (previous silent stroke).  Given previous incidental stroke without workup, will complete stroke workup now  Resultant  No new neuro deficits  MRI  No acute stroke, old R parietal MCA infarct  MRA  Motion degrade. No large vessel stenosis. Moderate L M1 stenosis  Carotid Doppler  pending   2D Echo  pending   LDL 97  HgbA1c pending  Lovenox 40 mg sq daily for VTE prophylaxis Diet Heart Room service appropriate?: Yes; Fluid consistency:: Thin  aspirin 81 mg orally every day prior to admission, now on aspirin 325 mg orally every day  Ongoing aggressive stroke risk factor management  Therapy recommendations:  HHOT, PT pending  Disposition:  Anticipate return home (lives alone PTA. Has an aide 8h/day.)  Atrial Fibrillation  Home anticoagulation:  None given poor medication compliance, ongoing falls  New dx May 2016  CHA2DS2-VASc Score now  = 6, ?2 oral anticoagulation recommended  Age in Years:  ?23   +2    Sex:  Female   Female   +  1    Hypertension History:  yes   +1     Diabetes Mellitus:  0   Congestive Heart Failure History:  0  Vascular Disease History:  0     Stroke/TIA/Thromboembolism History:  yes   +2  Pt a poor anticoagulation candidate given above reasons, admitted for fall this time, continue aspirin 325 mg daily  Hypertension  Stable  Hyperlipidemia  Home meds:  No statin  LDL 97, goal < 70  Add statin pravastatin  Continue statin at discharge  Other Stroke Risk Factors  Advanced age  Former Cigarette smoker, quit smoking   Other Active Problems  Baseline dementia  Sjogren's Syndrome  Hospital day # [place] for Pager information 07/27/2014 10:42 AM  I have personally examined this patient, reviewed notes, independently viewed imaging studies, participated in medical decision making and plan of care. I have made  any additions or clarifications directly to the above note. Agree with note above. The patient has presented with an unexplained fall without any focal symptoms or findings on exam. MRI scan of the brain doesn't not show any acute stroke even though CT scan raised suspicion for one. She however has atrial fibrillation and a prior silent stroke and hence remains at risk for recurrent strokes, TIAs, neurological worsening and needs ongoing stroke evaluation and aggressive risk factor modification. She is a poor candidate for long-term anticoagulation given multiple falls and dementia and hence recommend aspirin for stroke prevention.  Antony Contras, MD Medical Director [facility] Pager: [phone] 07/27/2014 2:10 PM    To contact Stroke Continuity provider, please refer to [email or web address] After hours, contact General Neurology

## 2014-07-27 NOTE — Progress Notes (Signed)
*  PRELIMINARY RESULTS* Vascular Ultrasound Carotid Duplex (Doppler) has been completed.   Findings suggest 1-39% internal carotid artery stenosis bilaterally. Vertebral arteries are patent with antegrade flow.  07/27/2014 4:11 PM Maudry Mayhew, RVT, RDCS, RDMS

## 2014-07-27 NOTE — Clinical Social Work Note (Signed)
CSW consult acknowledged:  Clinical Education officer, museum received a consult for SNF placement. CSW to meet with patient and pt's family to complete psychosocial assessment.  CSW remains available.   Glendon Axe, MSW, LCSWA [phone] 07/27/2014 3:37 PM

## 2014-07-27 NOTE — Evaluation (Signed)
Occupational Therapy Evaluation Patient Details Name: Ariana [name] MRN: [id number] DOB: Jun 24, 1929 Today's Date: 07/27/2014    History of Present Illness 81 yo female s/p fall from couch. Pt with x3 days of falls. MRI (+) Subacute to chronic RIGHT parietal lobe infarct, MCA territory. Old RIGHT frontal/insula encephalomalacia (MCA territory) PMH: fibromyalgias, chronic back pain, HTN, dementia, abnormal gait, Sjogren's syndrome.   Clinical Impression   Pt reports decreased sensation in L hand, digits 3-5 and reports having neuropathy in L LE. Pt denies vision changes, reports wearing glasses but does not have in room. Pt oriented to person, place and situation but not time. Pt reports having home aide, no family present to confirm A available upon d/c. Pt requires A for balance to complete ADLs at this time. Pt would benefit from skilled OT to A with safety and balance related to ADLs.    Follow Up Recommendations  Home health OT    Equipment Recommendations       Recommendations for Other Services       Precautions / Restrictions Precautions Precautions: Fall Restrictions Weight Bearing Restrictions: No      Mobility Bed Mobility Overal bed mobility: Modified Independent             General bed mobility comments: cuing to sit EOB with feet on floor.  Transfers Overall transfer level: Needs assistance Equipment used: Rolling walker (2 wheeled) Transfers: Sit to/from Stand Sit to Stand: Min guard              Balance Overall balance assessment: Needs assistance Sitting-balance support: Feet supported;Single extremity supported       Standing balance support: Single extremity supported;During functional activity                                ADL Overall ADL's : Needs assistance/impaired Eating/Feeding: Independent;Sitting   Grooming: Wash/dry hands;Wash/dry face;Brushing hair;Min guard;Standing   Upper Body Bathing: Min guard;Sitting    Lower Body Bathing: Minimal assistance;Sit to/from stand   Upper Body Dressing : Min guard;Sitting   Lower Body Dressing: Minimal assistance;Sit to/from stand Lower Body Dressing Details (indicate cue type and reason): Pt demonstrates posterior lean when brining foot up to don socks Toilet Transfer: Min guard;Ambulation;Regular Toilet;RW;Grab bars   Toileting- Clothing Manipulation and Hygiene: Supervision/safety;Sit to/from stand   Tub/ Shower Transfer: Tub transfer;Shower seat;Ambulation;Rolling walker Tub/Shower Transfer Details (indicate cue type and reason): Pt reports home aide provides A with tub transfer Functional mobility during ADLs: Min guard;Rolling walker General ADL Comments: Pt reports home aide A with bathing, meal prep and housework     Vision Vision Assessment?: No apparent visual deficits   Perception     Praxis      Pertinent Vitals/Pain Pain Assessment: Faces Faces Pain Scale: Hurts little more Pain Location: R shoulder Pain Descriptors / Indicators: Aching;Sore Pain Intervention(s): Monitored during session;Repositioned     Hand Dominance Right   Extremity/Trunk Assessment Upper Extremity Assessment Upper Extremity Assessment: RUE deficits/detail;LUE deficits/detail RUE Deficits / Details: Pt reports shoulder pain, 3/5 MMT RUE: Unable to fully assess due to pain LUE Deficits / Details: Pt reports digits 3-5 "feel funny", decreased sensation LUE:  (3/5 MMT) LUE Sensation: decreased light touch;history of peripheral neuropathy (Pt reports neuropathy in L LE)   Lower Extremity Assessment Lower Extremity Assessment: Defer to PT evaluation       Communication Communication Communication: No difficulties   Cognition Arousal/Alertness: Awake/alert (Alert to  person, place and situation; not time) Behavior During Therapy: WFL for tasks assessed/performed Overall Cognitive Status: No family/caregiver present to determine baseline cognitive  functioning                     General Comments       Exercises       Shoulder Instructions      Home Living Family/patient expects to be discharged to:: Private residence Living Arrangements: Alone Available Help at Discharge: Friend(s);Personal care attendant;Available PRN/intermittently;Family (9-5 aide) Type of Home: Apartment Home Access: Level entry     Home Layout: One level     Bathroom Shower/Tub: Tub/shower unit Shower/tub characteristics: Curtain Biochemist, clinical: Standard     Home Equipment: Environmental consultant - 4 wheels;Cane - single point;Shower seat;Grab bars - tub/shower;Bedside commode          Prior Functioning/Environment Level of Independence: Needs assistance    ADL's / Homemaking Assistance Needed: Pt reports having aide 9a-5p M-F to A with meals, housework and tub transfers        OT Diagnosis: Generalized weakness   OT Problem List: Decreased strength;Impaired balance (sitting and/or standing);Decreased safety awareness;Decreased knowledge of use of DME or AE;Impaired sensation   OT Treatment/Interventions: Self-care/ADL training;Therapeutic exercise;DME and/or AE instruction;Therapeutic activities;Patient/family education;Balance training    OT Goals(Current goals can be found in the care plan section) Acute Rehab OT Goals Patient Stated Goal: To feel better OT Goal Formulation: With patient Time For Goal Achievement: 08/10/14 Potential to Achieve Goals: Good ADL Goals Pt Will Perform Lower Body Dressing: with supervision;sit to/from stand Pt Will Transfer to Toilet: with supervision;regular height toilet;ambulating Pt Will Perform Tub/Shower Transfer: Tub transfer;with min assist;ambulating;shower seat;rolling walker  OT Frequency: Min 2X/week   Barriers to D/C:            Co-evaluation              End of Session Equipment Utilized During Treatment: Gait belt;Rolling walker Nurse Communication: Mobility  status;Precautions  Activity Tolerance: Patient tolerated treatment well Patient left: in chair;with call bell/phone within reach;with chair alarm set   Time: 8341-9622 OT Time Calculation (min): 45 min Charges:  OT General Charges $OT Visit: 1 Procedure OT Evaluation $Initial OT Evaluation Tier I: 1 Procedure OT Treatments $Self Care/Home Management : 23-37 mins G-Codes:    Forest Gleason 07/27/2014, 9:53 AM

## 2014-07-27 NOTE — Progress Notes (Signed)
  Echocardiogram 2D Echocardiogram has been performed.  Ariana [name] 07/27/2014, 2:16 PM

## 2014-07-27 NOTE — Progress Notes (Addendum)
[facility] PROGRESS NOTE  Ariana [name] PPJ:[id number] DOB: [birth year] DOA: 07/26/2014 PCP: Hollace Kinnier, DO  Assessment/Plan: Active Problems:   CVA (cerebral infarction)   Multiple falls MRI shows subacute to chronic right parietal lobe infarct in the MCA territory, old right frontal encephalomalacia MRA without large vessel occlusion PT recommends SNF 2-D echo, carotid Doppler pending   Atrial Fibrillation,CHA2DS2-VASc Score now = 6 Hold  anticoagulation: None given poor medication compliance, ongoing falls Pt a poor anticoagulation candidate given above reasons, admitted for fall this time, continue aspirin 325 mg daily   Hypertension continue cardizem, metoprolol  Gastroesophageal reflux disease on Nexium at home  Chronic low back pain syndrome and fibromyalgia  Anxiety and depression, continue xanax , Cymbalta ,  Peripheral neuropathy continue Lyrica  Dementia   Code Status: full Family Communication: family updated about patient's clinical progress Disposition Plan:  Home vs snf in am    Brief narrative: 81 y.o. female with a past medical history that is relevant for HTN, Sjogren's syndrome, dementia, fibromyalgia, chronic back pain, transferred to [facility] for further stroke evaluation and management. Patient was initially brought to [facility] after a fall. Ariana [name] tells me that she had had prior falls, but with this last fall she recalls trying to get up from a couch to go to the bathroom, and then she slipped down onto the ground and was having difficulty getting up. She has to crawl to a phone to call her neighbor who contacted EMS to bring her here. Denies having CP, SOB, palpitations, vertigo, lightheadedness before falling. She denies losing consciousness or abnormal movements while in the floor. Complains of having a HA and legs pain. CT brain was performed in the ED, personally reviewed, and revealed a new 2.5 cm region of relatively  well-defined hypoattenuation involving right parietal cortex and subcortical white matter.    Consultants:  Neurology  Procedures:  None  Antibiotics: Anti-infectives    Start     Dose/Rate Route Frequency Ordered Stop   07/27/14 1000  hydroxychloroquine (PLAQUENIL) tablet 200 mg     200 mg Oral Daily 07/26/14 2135           HPI/Subjective: Afebrile, no chest pain or shortness of breath  Objective: Filed Vitals:   07/27/14 0630 07/27/14 0700 07/27/14 0800 07/27/14 1012  BP: 120/51 142/63 130/62 125/45  Pulse: 63  75 78  Temp: 97.8 F (36.6 C)  97.8 F (36.6 C) 97.7 F (36.5 C)  TempSrc: Oral  Oral Oral  Resp: 16  16 16   Height:      Weight:      SpO2: 97%  95% 96%    Intake/Output Summary (Last 24 hours) at 07/27/14 1306 Last data filed at 07/27/14 1000  Gross per 24 hour  Intake    240 ml  Output      0 ml  Net    240 ml    Exam:  General: No acute respiratory distress Lungs: Clear to auscultation bilaterally without wheezes or crackles Cardiovascular: Regular rate and rhythm without murmur gallop or rub normal S1 and S2 Abdomen: Nontender, nondistended, soft, bowel sounds positive, no rebound, no ascites, no appreciable mass Extremities: No significant cyanosis, clubbing, or edema bilateral lower extremities     Data Review   Micro Results No results found for this or any previous visit (from the past 240 hour(s)).  Radiology Reports Dg Cervical Spine Complete  07/26/2014   CLINICAL DATA:  81 year old female with  history of fall earlier today complaining of neck and back pain.  EXAM: CERVICAL SPINE  4+ VIEWS  COMPARISON:  No priors.  FINDINGS: Multiple views of the cervical spine demonstrate no acute displaced fracture. Alignment is anatomic. Prevertebral soft tissues are normal. Severe multilevel degenerative disc disease is noted, most pronounced at C4-C5, C5-C6 and C6-C7. Multilevel facet arthropathy. Edentulous patient.  IMPRESSION: 1. No acute  radiographic abnormality of the cervical spine. 2. Severe multilevel degenerative disc disease and cervical spondylosis, as above.   Electronically Signed   By: Vinnie Langton M.D.   On: 07/26/2014 16:45   Dg Lumbar Spine Complete  07/26/2014   CLINICAL DATA:  Slip at home while getting out of chair today. Low back pain. Initial encounter.  EXAM: LUMBAR SPINE - COMPLETE 4+ VIEW  COMPARISON:  Abdominal radiographs 05/15/2014. CT abdomen and pelvis 07/20/2013.  FINDINGS: There are 5 non rib-bearing lumbar type vertebral bodies. Lumbar vertebral alignment is within normal limits. Lumbar vertebral body heights are preserved without evidence of compression fracture. Severe disc space height loss is again seen at L5-S1 with endplate sclerosis and osteophytosis. Intervertebral disc space heights are preserved elsewhere in the lumbar spine. Mild degenerative changes are noted in the lower thoracic spine. No pars defects are identified. Facet arthrosis is noted L5-S1. Right upper quadrant abdominal surgical clips are noted.  IMPRESSION: No acute osseous abnormality identified.  L5-S1 disc degeneration.   Electronically Signed   By: Logan Bores   On: 07/26/2014 16:50   Ct Head Wo Contrast  07/26/2014   CLINICAL DATA:  Mechanical fall upon getting up from couch. Initial encounter.  EXAM: CT HEAD WITHOUT CONTRAST  TECHNIQUE: Contiguous axial images were obtained from the base of the skull through the vertex without intravenous contrast.  COMPARISON:  07/20/2013  FINDINGS: There is a new 2.5 cm region of relatively well-defined hypoattenuation involving right parietal cortex and subcortical white matter (series 2, image 19). A small, chronic infarct is again seen in the right MCA territory more anteriorly involving the anterior insula and inferior frontal lobe. There is mild global cerebral atrophy. Patchy periventricular white matter hypodensities elsewhere in both cerebral hemispheres are similar to the prior study  and nonspecific but compatible with mild chronic small vessel ischemic disease. There is no evidence of acute intracranial hemorrhage, mass, midline shift, or extra-axial fluid collection.  Prior bilateral cataract extraction is noted. 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 Prior right temporal fixation. No acute skull fracture is identified.  IMPRESSION: 1. No acute intracranial hemorrhage. 2. 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   Electronically Signed   By: Logan Bores   On: 07/26/2014 16:06   Mr Brain Wo Contrast  07/27/2014   CLINICAL DATA:  [place] from couch, headache and leg pain. New RIGHT parietal lesion noted on CT, here for further evaluation. History of hypertension, Sjogren's, memory loss/dementia.  EXAM: MRI HEAD WITHOUT CONTRAST  MRA HEAD WITHOUT CONTRAST  TECHNIQUE: Multiplanar, multiecho pulse sequences of the brain and surrounding structures were obtained without intravenous contrast. Angiographic images of the head were obtained using MRA technique without contrast.  COMPARISON:  CT head July 26, 2014 at 1558 hours.  FINDINGS: MRI HEAD FINDINGS  Faint reduced diffusion in RIGHT parietal lobe corresponding to CT abnormality, T2 shine through. No reduced diffusion to suggest acute ischemia. No midline shift or mass effect. Punctate susceptibility artifact in the LEFT frontal lobe associated with low T1 signal developmental venous anomaly. RIGHT frontal/anterior  insula encephalomalacia. Patchy supratentorial LEFT pontine white matter T2 hyperintensities.  No abnormal extra-axial fluid collections. Status post bilateral ocular lens implants. Atretic RIGHT maxillary sinus with mucosal thickening consistent with chronic sinusitis without acute component. The mastoid air cells are well aerated. Susceptibility artifact about the RIGHT posterior zygomatic arch corresponding to cerclage wires on recent CT. No abnormal sellar expansion. No cerebellar  tonsillar ectopia. No suspicious calvarial bone marrow signal. Patient is edentulous.  MRA HEAD FINDINGS  Moderately motion degraded examination.  Flow related enhancement within the bilateral cervical, petrous, cavernous and supraclinoid and internal carotid arteries though due to motion, limited evaluation for aneurysm or stenosis. Flow related enhancement within the bilateral anterior and middle cerebral arteries with at least moderate stenosis proximal LEFT 1 segment suspected. LEFT anterior cerebral artery is dominant with diminutive, patent RIGHT anterior cerebral artery.  LEFT vertebral artery is dominant, the RIGHT vertebral artery terminates in the posterior inferior cerebellar artery. Flow related enhancement of basilar artery and posterior cerebral arteries.  No large vessel occlusion.  IMPRESSION: MRI HEAD: No acute intracranial process, specifically no acute ischemia.  Subacute to chronic RIGHT parietal lobe infarct, MCA territory. Old RIGHT frontal/insula encephalomalacia (MCA territory).  Mild to moderate white matter changes compatible with chronic small vessel ischemic disease.  Suspected LEFT frontal developmental venous anomaly could be confirmed on contrast-enhanced sequences as clinically indicated.  MRA HEAD: Moderately motion degraded examination without large vessel occlusion.  Moderate stenosis suspected of LEFT M1 origin. Diminutive RIGHT anterior cerebral artery likely on developmental basis.   Electronically Signed   By: Elon Alas M.D.   On: 07/27/2014 00:39   Dg Knee Complete 4 Views Left  07/26/2014   CLINICAL DATA:  81 year old female with history of trauma from a fall earlier today complaining of left knee pain.  EXAM: LEFT KNEE - COMPLETE 4+ VIEW  COMPARISON:  No priors.  FINDINGS: Small suprapatellar effusion. No acute displaced fracture, subluxation or dislocation. Mild joint space narrowing, subchondral sclerosis and osteophyte formation, most evident in the medial and  patellofemoral compartments.  IMPRESSION: 1. Small suprapatellar effusion. 2. Negative for fracture. 3. Mild degenerative changes of osteoarthritis, most pronounced in the medial and patellofemoral compartments.   Electronically Signed   By: Vinnie Langton M.D.   On: 07/26/2014 16:47   Mr Jodene Nam Head/brain Wo Cm  07/27/2014   CLINICAL DATA:  [place] from couch, headache and leg pain. New RIGHT parietal lesion noted on CT, here for further evaluation. History of hypertension, Sjogren's, memory loss/dementia.  EXAM: MRI HEAD WITHOUT CONTRAST  MRA HEAD WITHOUT CONTRAST  TECHNIQUE: Multiplanar, multiecho pulse sequences of the brain and surrounding structures were obtained without intravenous contrast. Angiographic images of the head were obtained using MRA technique without contrast.  COMPARISON:  CT head July 26, 2014 at 1558 hours.  FINDINGS: MRI HEAD FINDINGS  Faint reduced diffusion in RIGHT parietal lobe corresponding to CT abnormality, T2 shine through. No reduced diffusion to suggest acute ischemia. No midline shift or mass effect. Punctate susceptibility artifact in the LEFT frontal lobe associated with low T1 signal developmental venous anomaly. RIGHT frontal/anterior insula encephalomalacia. Patchy supratentorial LEFT pontine white matter T2 hyperintensities.  No abnormal extra-axial fluid collections. Status post bilateral ocular lens implants. Atretic RIGHT maxillary sinus with mucosal thickening consistent with chronic sinusitis without acute component. The mastoid air cells are well aerated. Susceptibility artifact about the RIGHT posterior zygomatic arch corresponding to cerclage wires on recent CT. No abnormal sellar expansion. No cerebellar tonsillar ectopia. No  suspicious calvarial bone marrow signal. Patient is edentulous.  MRA HEAD FINDINGS  Moderately motion degraded examination.  Flow related enhancement within the bilateral cervical, petrous, cavernous and supraclinoid and internal carotid  arteries though due to motion, limited evaluation for aneurysm or stenosis. Flow related enhancement within the bilateral anterior and middle cerebral arteries with at least moderate stenosis proximal LEFT 1 segment suspected. LEFT anterior cerebral artery is dominant with diminutive, patent RIGHT anterior cerebral artery.  LEFT vertebral artery is dominant, the RIGHT vertebral artery terminates in the posterior inferior cerebellar artery. Flow related enhancement of basilar artery and posterior cerebral arteries.  No large vessel occlusion.  IMPRESSION: MRI HEAD: No acute intracranial process, specifically no acute ischemia.  Subacute to chronic RIGHT parietal lobe infarct, MCA territory. Old RIGHT frontal/insula encephalomalacia (MCA territory).  Mild to moderate white matter changes compatible with chronic small vessel ischemic disease.  Suspected LEFT frontal developmental venous anomaly could be confirmed on contrast-enhanced sequences as clinically indicated.  MRA HEAD: Moderately motion degraded examination without large vessel occlusion.  Moderate stenosis suspected of LEFT M1 origin. Diminutive RIGHT anterior cerebral artery likely on developmental basis.   Electronically Signed   By: Elon Alas M.D.   On: 07/27/2014 00:39     CBC  Recent Labs Lab 07/26/14 1635 07/26/14 1642  WBC 6.5  --   HGB 12.7 14.3  HCT 39.0 42.0  PLT 217  --   MCV 94.4  --   MCH 30.8  --   MCHC 32.6  --   RDW 13.1  --   LYMPHSABS 1.4  --   MONOABS 0.7  --   EOSABS 0.2  --   BASOSABS 0.0  --     Chemistries   Recent Labs Lab 07/26/14 1642  NA 142  K 4.0  CL 103  GLUCOSE 99  BUN 16  CREATININE 0.70   ------------------------------------------------------------------------------------------------------------------ estimated creatinine clearance is 47.9 mL/min (by C-G formula based on Cr of  0.7). ------------------------------------------------------------------------------------------------------------------ No results for input(s): HGBA1C in the last 72 hours. ------------------------------------------------------------------------------------------------------------------  Recent Labs  07/27/14 0332  CHOL 157  HDL 48  LDLCALC 97  TRIG 58  CHOLHDL 3.3   ------------------------------------------------------------------------------------------------------------------ No results for input(s): TSH, T4TOTAL, T3FREE, THYROIDAB in the last 72 hours.  Invalid input(s): FREET3 ------------------------------------------------------------------------------------------------------------------ No results for input(s): VITAMINB12, FOLATE, FERRITIN, TIBC, IRON, RETICCTPCT in the last 72 hours.  Coagulation profile No results for input(s): INR, PROTIME in the last 168 hours.  No results for input(s): DDIMER in the last 72 hours.  Cardiac Enzymes No results for input(s): CKMB, TROPONINI, MYOGLOBIN in the last 168 hours.  Invalid input(s): CK ------------------------------------------------------------------------------------------------------------------ Invalid input(s): POCBNP   CBG: No results for input(s): GLUCAP in the last 168 hours.     Studies: Dg Cervical Spine Complete  07/26/2014   CLINICAL DATA:  81 year old female with history of fall earlier today complaining of neck and back pain.  EXAM: CERVICAL SPINE  4+ VIEWS  COMPARISON:  No priors.  FINDINGS: Multiple views of the cervical spine demonstrate no acute displaced fracture. Alignment is anatomic. Prevertebral soft tissues are normal. Severe multilevel degenerative disc disease is noted, most pronounced at C4-C5, C5-C6 and C6-C7. Multilevel facet arthropathy. Edentulous patient.  IMPRESSION: 1. No acute radiographic abnormality of the cervical spine. 2. Severe multilevel degenerative disc disease and cervical  spondylosis, as above.   Electronically Signed   By: Vinnie Langton M.D.   On: 07/26/2014 16:45   Dg Lumbar Spine Complete  07/26/2014  CLINICAL DATA:  Slip at home while getting out of chair today. Low back pain. Initial encounter.  EXAM: LUMBAR SPINE - COMPLETE 4+ VIEW  COMPARISON:  Abdominal radiographs 05/15/2014. CT abdomen and pelvis 07/20/2013.  FINDINGS: There are 5 non rib-bearing lumbar type vertebral bodies. Lumbar vertebral alignment is within normal limits. Lumbar vertebral body heights are preserved without evidence of compression fracture. Severe disc space height loss is again seen at L5-S1 with endplate sclerosis and osteophytosis. Intervertebral disc space heights are preserved elsewhere in the lumbar spine. Mild degenerative changes are noted in the lower thoracic spine. No pars defects are identified. Facet arthrosis is noted L5-S1. Right upper quadrant abdominal surgical clips are noted.  IMPRESSION: No acute osseous abnormality identified.  L5-S1 disc degeneration.   Electronically Signed   By: Logan Bores   On: 07/26/2014 16:50   Ct Head Wo Contrast  07/26/2014   CLINICAL DATA:  Mechanical fall upon getting up from couch. Initial encounter.  EXAM: CT HEAD WITHOUT CONTRAST  TECHNIQUE: Contiguous axial images were obtained from the base of the skull through the vertex without intravenous contrast.  COMPARISON:  07/20/2013  FINDINGS: There is a new 2.5 cm region of relatively well-defined hypoattenuation involving right parietal cortex and subcortical white matter (series 2, image 19). A small, chronic infarct is again seen in the right MCA territory more anteriorly involving the anterior insula and inferior frontal lobe. There is mild global cerebral atrophy. Patchy periventricular white matter hypodensities elsewhere in both cerebral hemispheres are similar to the prior study and nonspecific but compatible with mild chronic small vessel ischemic disease. There is no evidence of acute  intracranial hemorrhage, mass, midline shift, or extra-axial fluid collection.  Prior bilateral cataract extraction is noted. 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 Prior right temporal fixation. No acute skull fracture is identified.  IMPRESSION: 1. No acute intracranial hemorrhage. 2. 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   Electronically Signed   By: Logan Bores   On: 07/26/2014 16:06   Mr Brain Wo Contrast  07/27/2014   CLINICAL DATA:  [place] from couch, headache and leg pain. New RIGHT parietal lesion noted on CT, here for further evaluation. History of hypertension, Sjogren's, memory loss/dementia.  EXAM: MRI HEAD WITHOUT CONTRAST  MRA HEAD WITHOUT CONTRAST  TECHNIQUE: Multiplanar, multiecho pulse sequences of the brain and surrounding structures were obtained without intravenous contrast. Angiographic images of the head were obtained using MRA technique without contrast.  COMPARISON:  CT head July 26, 2014 at 1558 hours.  FINDINGS: MRI HEAD FINDINGS  Faint reduced diffusion in RIGHT parietal lobe corresponding to CT abnormality, T2 shine through. No reduced diffusion to suggest acute ischemia. No midline shift or mass effect. Punctate susceptibility artifact in the LEFT frontal lobe associated with low T1 signal developmental venous anomaly. RIGHT frontal/anterior insula encephalomalacia. Patchy supratentorial LEFT pontine white matter T2 hyperintensities.  No abnormal extra-axial fluid collections. Status post bilateral ocular lens implants. Atretic RIGHT maxillary sinus with mucosal thickening consistent with chronic sinusitis without acute component. The mastoid air cells are well aerated. Susceptibility artifact about the RIGHT posterior zygomatic arch corresponding to cerclage wires on recent CT. No abnormal sellar expansion. No cerebellar tonsillar ectopia. No suspicious calvarial bone marrow signal. Patient is edentulous.  MRA HEAD FINDINGS  Moderately  motion degraded examination.  Flow related enhancement within the bilateral cervical, petrous, cavernous and supraclinoid and internal carotid arteries though due to motion, limited evaluation for aneurysm or stenosis. Flow  related enhancement within the bilateral anterior and middle cerebral arteries with at least moderate stenosis proximal LEFT 1 segment suspected. LEFT anterior cerebral artery is dominant with diminutive, patent RIGHT anterior cerebral artery.  LEFT vertebral artery is dominant, the RIGHT vertebral artery terminates in the posterior inferior cerebellar artery. Flow related enhancement of basilar artery and posterior cerebral arteries.  No large vessel occlusion.  IMPRESSION: MRI HEAD: No acute intracranial process, specifically no acute ischemia.  Subacute to chronic RIGHT parietal lobe infarct, MCA territory. Old RIGHT frontal/insula encephalomalacia (MCA territory).  Mild to moderate white matter changes compatible with chronic small vessel ischemic disease.  Suspected LEFT frontal developmental venous anomaly could be confirmed on contrast-enhanced sequences as clinically indicated.  MRA HEAD: Moderately motion degraded examination without large vessel occlusion.  Moderate stenosis suspected of LEFT M1 origin. Diminutive RIGHT anterior cerebral artery likely on developmental basis.   Electronically Signed   By: Elon Alas M.D.   On: 07/27/2014 00:39   Dg Knee Complete 4 Views Left  07/26/2014   CLINICAL DATA:  81 year old female with history of trauma from a fall earlier today complaining of left knee pain.  EXAM: LEFT KNEE - COMPLETE 4+ VIEW  COMPARISON:  No priors.  FINDINGS: Small suprapatellar effusion. No acute displaced fracture, subluxation or dislocation. Mild joint space narrowing, subchondral sclerosis and osteophyte formation, most evident in the medial and patellofemoral compartments.  IMPRESSION: 1. Small suprapatellar effusion. 2. Negative for fracture. 3. Mild  degenerative changes of osteoarthritis, most pronounced in the medial and patellofemoral compartments.   Electronically Signed   By: Vinnie Langton M.D.   On: 07/26/2014 16:47   Mr Jodene Nam Head/brain Wo Cm  07/27/2014   CLINICAL DATA:  [place] from couch, headache and leg pain. New RIGHT parietal lesion noted on CT, here for further evaluation. History of hypertension, Sjogren's, memory loss/dementia.  EXAM: MRI HEAD WITHOUT CONTRAST  MRA HEAD WITHOUT CONTRAST  TECHNIQUE: Multiplanar, multiecho pulse sequences of the brain and surrounding structures were obtained without intravenous contrast. Angiographic images of the head were obtained using MRA technique without contrast.  COMPARISON:  CT head July 26, 2014 at 1558 hours.  FINDINGS: MRI HEAD FINDINGS  Faint reduced diffusion in RIGHT parietal lobe corresponding to CT abnormality, T2 shine through. No reduced diffusion to suggest acute ischemia. No midline shift or mass effect. Punctate susceptibility artifact in the LEFT frontal lobe associated with low T1 signal developmental venous anomaly. RIGHT frontal/anterior insula encephalomalacia. Patchy supratentorial LEFT pontine white matter T2 hyperintensities.  No abnormal extra-axial fluid collections. Status post bilateral ocular lens implants. Atretic RIGHT maxillary sinus with mucosal thickening consistent with chronic sinusitis without acute component. The mastoid air cells are well aerated. Susceptibility artifact about the RIGHT posterior zygomatic arch corresponding to cerclage wires on recent CT. No abnormal sellar expansion. No cerebellar tonsillar ectopia. No suspicious calvarial bone marrow signal. Patient is edentulous.  MRA HEAD FINDINGS  Moderately motion degraded examination.  Flow related enhancement within the bilateral cervical, petrous, cavernous and supraclinoid and internal carotid arteries though due to motion, limited evaluation for aneurysm or stenosis. Flow related enhancement within the  bilateral anterior and middle cerebral arteries with at least moderate stenosis proximal LEFT 1 segment suspected. LEFT anterior cerebral artery is dominant with diminutive, patent RIGHT anterior cerebral artery.  LEFT vertebral artery is dominant, the RIGHT vertebral artery terminates in the posterior inferior cerebellar artery. Flow related enhancement of basilar artery and posterior cerebral arteries.  No large vessel occlusion.  IMPRESSION:  MRI HEAD: No acute intracranial process, specifically no acute ischemia.  Subacute to chronic RIGHT parietal lobe infarct, MCA territory. Old RIGHT frontal/insula encephalomalacia (MCA territory).  Mild to moderate white matter changes compatible with chronic small vessel ischemic disease.  Suspected LEFT frontal developmental venous anomaly could be confirmed on contrast-enhanced sequences as clinically indicated.  MRA HEAD: Moderately motion degraded examination without large vessel occlusion.  Moderate stenosis suspected of LEFT M1 origin. Diminutive RIGHT anterior cerebral artery likely on developmental basis.   Electronically Signed   By: Elon Alas M.D.   On: 07/27/2014 00:39      Lab Results  Component Value Date   HGBA1C 5.7 05/16/2012   HGBA1C 5.7* 07/18/2011   Lab Results  Component Value Date   LDLCALC 97 07/27/2014   CREATININE 0.70 07/26/2014       Scheduled Meds: . aspirin  325 mg Oral Daily  . diltiazem  180 mg Oral Daily  . DULoxetine  30 mg Oral Daily  . DULoxetine  60 mg Oral QHS  . enoxaparin (LOVENOX) injection  40 mg Subcutaneous Q24H  . hydroxychloroquine  200 mg Oral Daily  . memantine  28 mg Oral Daily  . metoprolol  50 mg Oral BID  . pregabalin  75 mg Oral BID   Continuous Infusions:   Active Problems:   CVA (cerebral infarction)    Time spent: 60 minutes   [place] [facility] Pager [phone]. If 7PM-7AM, please contact night-coverage at [email or web address], password [facility] 07/27/2014, 1:06 PM  LOS:  1 day

## 2014-07-28 DIAGNOSIS — R296 Repeated falls: Secondary | ICD-10-CM

## 2014-07-28 DIAGNOSIS — I6350 Cerebral infarction due to unspecified occlusion or stenosis of unspecified cerebral artery: Secondary | ICD-10-CM | POA: Diagnosis not present

## 2014-07-28 DIAGNOSIS — I639 Cerebral infarction, unspecified: Secondary | ICD-10-CM | POA: Diagnosis not present

## 2014-07-28 LAB — HEMOGLOBIN A1C
Hgb A1c MFr Bld: 6.3 % — ABNORMAL HIGH (ref 4.8–5.6)
MEAN PLASMA GLUCOSE: 134 mg/dL

## 2014-07-28 MED ORDER — ALPRAZOLAM 0.5 MG PO TABS: ORAL_TABLET | ORAL | Status: DC

## 2014-07-28 MED ORDER — ASPIRIN 325 MG PO TABS: 325.0000 mg | ORAL_TABLET | Freq: Every day | ORAL | Status: DC

## 2014-07-28 MED ORDER — FAMOTIDINE 20 MG PO TABS: 20.0000 mg | ORAL_TABLET | Freq: Two times a day (BID) | ORAL | Status: DC | PRN

## 2014-07-28 MED ORDER — HYDROCODONE-ACETAMINOPHEN 5-325 MG PO TABS: 1.0000 | ORAL_TABLET | Freq: Two times a day (BID) | ORAL | Status: DC | PRN

## 2014-07-28 MED ORDER — PRAVASTATIN SODIUM 20 MG PO TABS: 20.0000 mg | ORAL_TABLET | Freq: Every day | ORAL | Status: DC

## 2014-07-28 NOTE — Progress Notes (Signed)
STROKE TEAM PROGRESS NOTE   HISTORY Ariana [name] is an 81 y.o. female with a past medical history that is relevant for HTN, Sjogren's syndrome, dementia, fibromyalgia, chronic back pain, transferred to [facility] for further stroke evaluation and management. Patient was initially brought to [facility] after a fall. Ariana [name] tells me that she had had prior falls, but with this last fall she recalls trying to get up from a couch to go to the bathroom, and then she slipped down onto the ground and was having difficulty getting up. She has to crawl to a phone to call her neighbor who contacted EMS to bring her here. Denies having CP, SOB, palpitations, vertigo, lightheadedness before falling. She denies losing consciousness or abnormal movements while in the floor. Complains of having a HA and legs pain. CT brain was performed in the ED and revealed a new 2.5 cm region of relatively well-defined hypoattenuation involving right parietal cortex and subcortical white matter. Last known well unable to determine. Patient was not administered TPA secondary to out of the window. She was admitted to the neuro ICU for further evaluation and treatment.   SUBJECTIVE (INTERVAL HISTORY) No family is at the bedside.  Overall she feels her condition is stable. No new neuro deficits. While she has a hx of dementia, her memory is pretty good on currently events and situation. She denied any focal neurological symptoms and stated that she's had multiple falls in the past and this time she fell and had trouble getting up. She has atrial fibrillation but has not been on anticoagulation presumably for prior falls and dementia. Carotid Dopplers and echocardiogram are unremarkable.   OBJECTIVE Temp:  [97.8 F (36.6 C)-98.7 F (37.1 C)] 98.1 F (36.7 C) (06/14 1400) Pulse Rate:  [51-65] 58 (06/14 1400) Cardiac Rhythm:  [-] Heart block (06/13 2000) Resp:  [16-20] 16 (06/14 1400) BP: (104-153)/(46-62) 132/53 mmHg (06/14  1400) SpO2:  [94 %-99 %] 95 % (06/14 1400)  No results for input(s): GLUCAP in the last 168 hours.  Recent Labs Lab 07/26/14 1642  NA 142  K 4.0  CL 103  GLUCOSE 99  BUN 16  CREATININE 0.70   No results for input(s): AST, ALT, ALKPHOS, BILITOT, PROT, ALBUMIN in the last 168 hours.  Recent Labs Lab 07/26/14 1635 07/26/14 1642  WBC 6.5  --   NEUTROABS 4.2  --   HGB 12.7 14.3  HCT 39.0 42.0  MCV 94.4  --   PLT 217  --    No results for input(s): CKTOTAL, CKMB, CKMBINDEX, TROPONINI in the last 168 hours. No results for input(s): LABPROT, INR in the last 72 hours.  Recent Labs  07/26/14 1547  COLORURINE YELLOW  LABSPEC 1.011  PHURINE 5.0  GLUCOSEU NEGATIVE  HGBUR NEGATIVE  BILIRUBINUR NEGATIVE  KETONESUR NEGATIVE  PROTEINUR NEGATIVE  UROBILINOGEN 0.2  NITRITE NEGATIVE  LEUKOCYTESUR MODERATE*       Component Value Date/Time   CHOL 157 07/27/2014 0332   TRIG 58 07/27/2014 0332   HDL 48 07/27/2014 0332   CHOLHDL 3.3 07/27/2014 0332   VLDL 12 07/27/2014 0332   LDLCALC 97 07/27/2014 0332   Lab Results  Component Value Date   HGBA1C 6.3* 07/27/2014      Component Value Date/Time   LABOPIA NONE DETECTED 07/19/2011 0022   COCAINSCRNUR NONE DETECTED 07/19/2011 0022   LABBENZ NONE DETECTED 07/19/2011 0022   AMPHETMU NONE DETECTED 07/19/2011 0022   THCU NONE DETECTED 07/19/2011 0022   LABBARB NONE DETECTED  07/19/2011 0022    No results for input(s): ETH in the last 168 hours.  Dg Cervical Spine Complete 07/26/2014   1. No acute radiographic abnormality of the cervical spine. 2. Severe multilevel degenerative disc disease and cervical spondylosis  Dg Lumbar Spine Complete 07/26/2014    No acute osseous abnormality identified.  L5-S1 disc degeneration.     Ct Head Wo Contrast 07/26/2014   1. No acute intracranial hemorrhage. 2. 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     Dg Knee Complete 4 Views Left 07/26/2014   1. Small suprapatellar  effusion. 2. Negative for fracture. 3. Mild degenerative changes of osteoarthritis, most pronounced in the medial and patellofemoral compartments.     MRI HEAD 07/27/2014   No acute intracranial process, specifically no acute ischemia.  Subacute to chronic RIGHT parietal lobe infarct, MCA territory. Old RIGHT frontal/insula encephalomalacia (MCA territory).  Mild to moderate white matter changes compatible with chronic small vessel ischemic disease.  Suspected LEFT frontal developmental venous anomaly could be confirmed on contrast-enhanced sequences as clinically indicated.    MRA HEAD 07/27/2014   Moderately motion degraded examination without large vessel occlusion.  Moderate stenosis suspected of LEFT M1 origin. Diminutive RIGHT anterior cerebral artery likely on developmental basis.       PHYSICAL EXAM Frail elderly caucasian lady not in distress. . Afebrile. Head is nontraumatic. Neck is supple without bruit.    Cardiac exam no murmur or gallop. Lungs are clear to auscultation. Distal pulses are well felt. Neurological Exam :  Awake alert oriented 3. Slightly diminished attention and recall. Follows and occasional two-step commands. Able to name 7 animals in 1 minute. She can tell me the name of the current [place] Pres. Speech is clear without dysarthria or aphasia. Extraocular movements are pharyngeal or nystagmus. Fundi were not visualized. Vision acuity and fields appear adequate. Face is symmetric without weakness. Tongue is midline. Motor system exam revealed no upper or lower extremity drift. Symmetric and equal strength in all 4 extremities. No focal weakness. Touch pinprick sensation appear preserved bilaterally. Deep tendon reflexes are symmetric. Plantars are downgoing bilaterally. Gait was not tested. ASSESSMENT/PLAN Ariana [name] is a 81 y.o. female with history of HTN, Sjogren's syndrome, dementia, fibromyalgia, chronic back pain presenting to [facility] following a fall. She did not arrive  within t-PA window.    Fall. No acute stroke. Old R MCA infarct (previous silent stroke).  Given previous incidental stroke without workup, will complete stroke workup now  Resultant  No new neuro deficits  MRI  No acute stroke, old R parietal MCA infarct  MRA  Motion degrade. No large vessel stenosis. Moderate L M1 stenosis  Carotid Doppler  1-39% internal carotid artery stenosis bilaterally. Vertebral arteries are patent with antegrade flow.  2D Echo  Left ventricle: The cavity size was normal. Systolic function was normal. The estimated ejection fraction was in the range of 55% to 60%. Images were inadequate for LV wall motion assessment. Features are consistent with a pseudonormal left ventricular filling pattern, with concomitant abnormal relaxation and increased filling pressure   LDL 97  HgbA1c 6.3  Lovenox 40 mg sq daily for VTE prophylaxis Diet Heart Room service appropriate?: Yes; Fluid consistency:: Thin Diet - low sodium heart healthy  aspirin 81 mg orally every day prior to admission, now on aspirin 325 mg orally every day  Ongoing aggressive stroke risk factor management  Therapy recommendations:  HHOT, PT pending  Disposition:  Anticipate return home (lives  alone PTA. Has an aide 8h/day.)  Atrial Fibrillation  Home anticoagulation:  None given poor medication compliance, ongoing falls  New dx May 2016  CHA2DS2-VASc Score now  = 6, ?2 oral anticoagulation recommended  Age in Years:  ?20   +2    Sex:  Female   Female   +1    Hypertension History:  yes   +1     Diabetes Mellitus:  0   Congestive Heart Failure History:  0  Vascular Disease History:  0     Stroke/TIA/Thromboembolism History:  yes   +2  Pt a poor anticoagulation candidate given above reasons, admitted for fall this time, continue aspirin 325 mg daily  Hypertension  Stable  Hyperlipidemia  Home meds:  No statin  LDL 97, goal < 70  Add statin pravastatin  Continue  statin at discharge  Other Stroke Risk Factors  Advanced age  Former Cigarette smoker, quit smoking   Other Active Problems  Baseline dementia  Sjogren's Syndrome  Hospital day # [place] for Pager information 07/28/2014 3:44 PM  I have personally examined this patient, reviewed notes, independently viewed imaging studies, participated in medical decision making and plan of care. I have made any additions or clarifications directly to the above note. Agree with note above. The patient has presented with an unexplained fall without any focal symptoms or findings on exam. MRI scan of the brain doesn't not show any acute stroke even though CT scan raised suspicion for one. She however has atrial fibrillation and a prior silent stroke and hence remains at risk for recurrent strokes, TIAs, neurological worsening and needs ongoing stroke evaluation and aggressive risk factor modification. She is a poor candidate for long-term anticoagulation given multiple falls and dementia and hence recommend aspirin for stroke prevention. Stroke service will sign off at this time. Currently call for questions.  Antony Contras, MD Medical Director [facility] Pager: [phone] 07/28/2014 3:44 PM    To contact Stroke Continuity provider, please refer to [email or web address] After hours, contact General Neurology

## 2014-07-28 NOTE — Clinical Social Work Note (Signed)
Clinical Social Worker completed psychosocial assessment with patient and pt's family. Full psychosocial assessment to follow.   Patient refusing SNF placement. MD aware and patient will be discharging home.   RNCM, RN, and family notified.   Glendon Axe, MSW, LCSWA [phone] 07/28/2014 2:50 PM

## 2014-07-28 NOTE — Progress Notes (Signed)
Occupational Therapy Treatment Patient Details Name: Ariana [name] MRN: [id number] DOB: 1929/04/29 Today's Date: 07/28/2014    History of present illness 81 yo female s/p fall from couch. Pt with x3 days of falls. MRI (+) Subacute to chronic RIGHT parietal lobe infarct, MCA territory. Old RIGHT frontal/insula encephalomalacia (MCA territory) PMH: fibromyalgias, chronic back pain, HTN, dementia, abnormal gait, Sjogren's syndrome.   OT comments  Pt in chair upon arrival. Pt shows improved balance when applying lotion to LE and donning socks while seated. Pt required VC's to keep RW close to her body while ambulating and during sit<>stand transfers. Pt reports decreased sensation in L UE digits 3-5. Pt would benefit from skilled OT to increase independence and safety with ADLs. SNF recommended at this time due to h/o falls and no assist at home between 5pm-9am.     Follow Up Recommendations  SNF    Equipment Recommendations       Recommendations for Other Services      Precautions / Restrictions Precautions Precautions: Fall Restrictions Weight Bearing Restrictions: No       Mobility Bed Mobility Overal bed mobility: Modified Independent                Transfers Overall transfer level: Needs assistance Equipment used: Rolling walker (2 wheeled) Transfers: Sit to/from Stand Sit to Stand: Min guard         General transfer comment: VC's for hand placement during sit<> stand. Pt required multiple reminders to bring RW with her to chair when backing up to sit down.    Balance Overall balance assessment: Needs assistance;History of Falls Sitting-balance support: Feet supported;No upper extremity supported       Standing balance support: No upper extremity supported;During functional activity                       ADL Overall ADL's : Needs assistance/impaired     Grooming: Wash/dry hands;Wash/dry face;Supervision/safety;Standing   Upper Body Bathing:  Supervision/ safety;Standing   Lower Body Bathing: Supervison/ safety;Sit to/from stand (lotion on legs)       Lower Body Dressing: Supervision/safety;Sit to/from stand (don socks)   Toilet Transfer: Ambulation;RW;Regular Toilet;Min guard   Toileting- Water quality scientist and Hygiene: Min guard;Sit to/from stand       Functional mobility during ADLs: Electronics engineer     Praxis      Cognition   Behavior During Therapy: WFL for tasks assessed/performed Overall Cognitive Status: Within Functional Limits for tasks assessed                       Extremity/Trunk Assessment               Exercises     Shoulder Instructions       General Comments      Pertinent Vitals/ Pain       Pain Assessment: Faces Faces Pain Scale: Hurts little more Pain Location: L leg upon standing Pain Descriptors / Indicators: Grimacing;Sore Pain Intervention(s): Monitored during session;Repositioned  Home Living                                          Prior Functioning/Environment  Frequency Min 2X/week     Progress Toward Goals  OT Goals(current goals can now be found in the care plan section)     Acute Rehab OT Goals Patient Stated Goal: to go home OT Goal Formulation: With patient Time For Goal Achievement: 08/10/14 Potential to Achieve Goals: Good ADL Goals Pt Will Perform Lower Body Dressing: with supervision;sit to/from stand Pt Will Transfer to Toilet: with supervision;regular height toilet;ambulating Pt Will Perform Tub/Shower Transfer: Tub transfer;with min assist;ambulating;shower seat;rolling walker  Plan Discharge plan remains appropriate    Co-evaluation                 End of Session Equipment Utilized During Treatment: Gait belt;Rolling walker   Activity Tolerance Patient tolerated treatment well   Patient Left in bed;with call  bell/phone within reach;with bed alarm set;with nursing/sitter in room   Nurse Communication Mobility status;Precautions        Time: 4373-5789 OT Time Calculation (min): 30 min  Charges: OT General Charges $OT Visit: 1 Procedure OT Treatments $Self Care/Home Management : 23-37 mins  Forest Gleason 07/28/2014, 2:05 PM

## 2014-07-28 NOTE — Evaluation (Signed)
Speech Language Pathology Evaluation Patient Details Name: Ariana [name] MRN: [id number] DOB: 01-Jan-1930 Today's Date: 07/28/2014 Time: 9562-1308 SLP Time Calculation (min) (ACUTE ONLY): 15 min  Problem List:  Patient Active Problem List   Diagnosis Date Noted  . Fall   . Infarction of parietal lobe   . Mild dementia   . CVA (cerebral infarction) 07/26/2014  . Atrial fibrillation with RVR 07/03/2014  . CN (constipation) 05/15/2014  . Constipation 04/15/2014  . Mixed stress and urge urinary incontinence 11/03/2013  . Therapeutic opioid induced constipation 11/03/2013  . Hemorrhoid 11/03/2013  . Low back pain associated with a spinal disorder other than radiculopathy or spinal stenosis 11/03/2013  . Protein-calorie malnutrition, severe 07/22/2013  . Hypokalemia 07/20/2013  . UTI (lower urinary tract infection) 07/20/2013  . Prolonged QT interval 07/20/2013  . Osteopenia 07/17/2013  . Palpitations 06/30/2013  . Hereditary and idiopathic peripheral neuropathy 05/22/2013  . Abnormality of gait 12/05/2012  . Headache(784.0) 09/05/2012  . Multinodular thyroid 01/15/2012  . Neck pain 01/15/2012  . Generalized anxiety disorder 07/20/2011    Class: Chronic  . Senile dementia 07/14/2011  . Hypercholesterolemia 07/08/2010  . DRY EYE SYNDROME 07/06/2009  . Pass [place] SYNDROME 07/06/2009  . URINARY INCONTINENCE 01/06/2009  . COLONIC POLYPS, ADENOMATOUS, HX OF 04/15/2008  . MITRAL VALVE PROLAPSE 11/05/2007  . Memory loss 11/05/2007  . ADVERSE DRUG REACTION 11/05/2007  . ANXIETY DEPRESSION 07/02/2007  . Mononeuritis 07/02/2007  . HYPERTENSION, BENIGN 07/02/2007  . GERD 07/02/2007  . Irritable bowel syndrome 07/02/2007  . LOW BACK PAIN SYNDROME 07/02/2007  . Fibromyalgia 07/02/2007   Past Medical History:  Past Medical History  Diagnosis Date  . Sjogren's syndrome   . Dry eye syndrome   . Hypertension, benign   . Mitral valve prolapse   . GERD (gastroesophageal reflux disease)    . Diverticulosis of colon   . Irritable bowel syndrome   . Urinary incontinence   . Low back pain syndrome   . Fibromyalgia   . Memory loss   . Anxiety and depression   . History of adverse drug reaction   . Peripheral neuropathy     "both feet and legs"  . Shortness of breath 07/18/11    "alot lately"  . Anginal pain   . History of recurrent UTIs   . H/O hiatal hernia   . Anxiety   . Dementia   . Depression   . Abnormality of gait   . Thyroid nodule   . Headache(784.0) 09/05/2012  . Adenomatous polyp of colon 2002    67mm   Past Surgical History:  Past Surgical History  Procedure Laterality Date  . Vesicovaginal fistula closure w/ tah    . Appendectomy    . Cholecystectomy  2000  . Mandible surgery    . Temporomandibular joint surgery  1986    Dr. Terence Lux  . Cataract extraction, bilateral    . Abdominal hysterectomy  1967  . Dental surgery      multiple tooth extractions  . Esophagogastroduodenoscopy (egd) with esophageal dilation N/A 08/23/2012    Procedure: ESOPHAGOGASTRODUODENOSCOPY (EGD) WITH ESOPHAGEAL DILATION;  Surgeon: Milus Banister, MD;  Location: [facility] ENDOSCOPY;  Service: Endoscopy;  Laterality: N/A;  . Colonoscopy w/ biopsies      multiple  . Nasal septum surgery  1980  . Transthoracic echocardiogram  2001    mild LVH, normal LV  . Nm myocar perf wall motion  2003    persantine - normal static and dynamic study w/apical thinning  and presvered LV function, no ischemia  . Cardiac catheterization  02/17/2003    normal L main, LAD free of disease, Cfx free of disease, RCA free of disease (Dr. Rockne Menghini)   HPI:  81 yo female s/p fall from couch. Pt with x3 days of falls. MRI (+) Subacute to chronic RIGHT parietal lobe infarct, MCA territory. Old RIGHT frontal/insula encephalomalacia (MCA territory) PMH: fibromyalgias, chronic back pain, HTN, dementia, abnormal gait, Sjogren's syndrome.   Assessment / Plan / Recommendation Clinical Impression  Pt presents  with functional/baseline cognition with caregivers available during the [name].  Speech/language WNL.  Short-term recall deficits present. Pt at baseline - no SLP f/u recommended.     SLP Assessment  Patient does not need any further Speech Lanaguage Pathology Services    Follow Up Recommendations  None            Pertinent Vitals/Pain Pain Assessment: Faces Faces Pain Scale: Hurts little more Pain Location: L leg upon standing Pain Descriptors / Indicators: Grimacing;Sore Pain Intervention(s): Monitored during session;Repositioned   SLP Goals     SLP Evaluation Prior Functioning  Type of Home: Apartment  Lives With: Alone Available Help at Discharge: Friend(s);Personal care attendant;Available PRN/intermittently;Family   Cognition  Overall Cognitive Status: Within Functional Limits for tasks assessed Orientation Level: Oriented X4 Attention: Sustained Sustained Attention: Appears intact Memory: Impaired Memory Impairment: Decreased short term memory;Retrieval deficit Decreased Short Term Memory: Verbal basic Awareness: Impaired Problem Solving: Appears intact (for basic tasks)    Comprehension  Auditory Comprehension Overall Auditory Comprehension: Appears within functional limits for tasks assessed Visual Recognition/Discrimination Discrimination: Within Function Limits Reading Comprehension Reading Status: Not tested    Expression Expression Primary Mode of Expression: Verbal Verbal Expression Overall Verbal Expression: Appears within functional limits for tasks assessed Written Expression Dominant Hand: Right Written Expression: Not tested   Oral / Motor Oral Motor/Sensory Function Overall Oral Motor/Sensory Function: Appears within functional limits for tasks assessed Motor Speech Overall Motor Speech: Appears within functional limits for tasks assessed   Ariana [name] L. Tivis Ringer, [place] CCC/SLP Pager [phone]      Ariana [name] 07/28/2014, 2:32  PM

## 2014-07-28 NOTE — Progress Notes (Signed)
Discharge instructions reviewed with patient/family. All questions answered at this time. RXs given to patient/family. Transport home by family.  Ave Filter, RN

## 2014-07-28 NOTE — Care Management Note (Addendum)
Case Management Note  Patient Details  Name: Ariana [name] MRN: [id number] Date of Birth: Jul 09, 1929  Subjective/Objective:                    Action/Plan: Met with patient to discuss discharge needs.  Patient is unsure as to whether she would like home health services, as she is currently concerned about cost and feels overwhelmed.  Patient has been with [facility] in the past and gave CM permission to contact them.  Patient would like to discuss her concerns with them prior to making her decision regarding home health. She also intends to discuss this with her daughter.  Spoke with Stanton Kidney with [facility], who will stop by and speak with the patient. CM attempted to reach patient's daughter via phone, but she was unavailable.  Expected Discharge Date:                  Expected Discharge Plan:  [place]  In-House Referral:     Discharge planning Services  CM Consult  Post Acute Care Choice:    Choice offered to:     DME Arranged:    DME Agency:     HH Arranged:  RN, PT, OT, Nurse's Aide [place] Agency:  [facility]  Status of Service:     Medicare Important Message Given:  No Date Medicare IM Given:    Medicare IM give by:    Date Additional Medicare IM Given:    Additional Medicare Important Message give by:     If discussed at [place] of Stay Meetings, dates discussed:    Additional Comments:  Rolm Baptise, RN 07/28/2014, 3:38 PM

## 2014-07-28 NOTE — Discharge Summary (Signed)
Physician Discharge Summary  Ariana [name] MRN: [id number] DOB/AGE: 79-Apr-1931 79 y.o.  PCP: Hollace Kinnier, DO   Admit date: 07/26/2014 Discharge date: 07/28/2014  Discharge Diagnoses:   Active Problems:   Atrial fibrillation with RVR   CVA (cerebral infarction)   Fall   Infarction of parietal lobe   Mild dementia    Follow-up recommendations Follow-up with PCP in 3-5 days , including although additional recommended appointments as below Follow-up CBC, CMP, TSH, free T4 in 3-5 days      Medication List    STOP taking these medications        aspirin EC 81 MG tablet  Replaced by:  aspirin 325 MG tablet      TAKE these medications        ALPRAZolam 0.5 MG tablet  Commonly known as:  XANAX  Take one tablet by mouth twice daily for anxiety     aspirin 325 MG tablet  Take 1 tablet (325 mg total) by mouth daily.     bisacodyl 5 MG EC tablet  Commonly known as:  bisacodyl  Take 2 tablets (10 mg total) by mouth daily as needed for moderate constipation. If no effective bowel movement every 2- 3 days     CALCIUM CARBONATE PO  Take 1 tablet by mouth daily.     cetirizine 10 MG tablet  Commonly known as:  ZYRTEC  Take 1 tablet (10 mg total) by mouth daily.     CRANBERRY SOFT PO  Take 1 capsule by mouth daily.     diltiazem 180 MG 24 hr capsule  Commonly known as:  CARDIZEM CD  Take 1 capsule (180 mg total) by mouth daily.     DULoxetine 30 MG capsule  Commonly known as:  CYMBALTA  Take one tablet  once daily in the morning for depression     DULoxetine 60 MG capsule  Commonly known as:  CYMBALTA  Take one tablet by mouth in the evening once daily for depression     famotidine 20 MG tablet  Commonly known as:  PEPCID  Take 1 tablet (20 mg total) by mouth 2 (two) times daily as needed for heartburn.     HYDROcodone-acetaminophen 5-325 MG per tablet  Commonly known as:  NORCO/VICODIN  Take 1 tablet by mouth 2 (two) times daily as needed for moderate pain.      hydroxychloroquine 200 MG tablet  Commonly known as:  PLAQUENIL  Take 200 mg by mouth daily.     lactose free nutrition Liqd  Take 237 mLs by mouth 2 (two) times daily between meals.     lansoprazole 30 MG capsule  Commonly known as:  PREVACID  Take 1 capsule (30 mg total) by mouth daily at 12 noon.     lubiprostone 24 MCG capsule  Commonly known as:  AMITIZA  Take 1 capsule (24 mcg total) by mouth 2 (two) times daily with a meal.     metoprolol 50 MG tablet  Commonly known as:  LOPRESSOR  Take 1 tablet (50 mg total) by mouth 2 (two) times daily.     mirabegron ER 50 MG Tb24 tablet  Commonly known as:  MYRBETRIQ  Take 1 tablet (50 mg total) by mouth daily. Take one tablet daily for bladder     NAMENDA XR 28 MG Cp24 24 hr capsule  Generic drug:  memantine  TAKE ONE CAPSULE BY MOUTH EVERY DAY TO PRESERVE MEMORY     nystatin 100000 UNIT/ML suspension  Commonly  known as:  MYCOSTATIN  TAKE 1 TEASPOONFUL BY MOUTH 4 TIMES A DAY     pilocarpine 5 MG tablet  Commonly known as:  SALAGEN  Take 5 mg by mouth 2 (two) times daily.     pravastatin 20 MG tablet  Commonly known as:  PRAVACHOL  Take 1 tablet (20 mg total) by mouth daily at 6 PM.     pregabalin 75 MG capsule  Commonly known as:  LYRICA  Take 1 capsule (75 mg total) by mouth 2 (two) times daily.     REFRESH TEARS 0.5 % Soln  Generic drug:  carboxymethylcellulose  1 drop 2 (two) times daily as needed (dry eyes).     sodium chloride 0.65 % Soln nasal spray  Commonly known as:  OCEAN  Place 1 spray into the nose as needed for congestion.     triamcinolone ointment 0.1 %  Commonly known as:  KENALOG  Apply 1 application topically 2 (two) times daily as needed (rash).         Discharge Condition: Stable    Disposition: 06-Home-Health Care Svc   Consults:  Neurology     Significant Diagnostic Studies:  Dg Cervical Spine Complete  07/26/2014   CLINICAL DATA:  81 year old female with history of  fall earlier today complaining of neck and back pain.  EXAM: CERVICAL SPINE  4+ VIEWS  COMPARISON:  No priors.  FINDINGS: Multiple views of the cervical spine demonstrate no acute displaced fracture. Alignment is anatomic. Prevertebral soft tissues are normal. Severe multilevel degenerative disc disease is noted, most pronounced at C4-C5, C5-C6 and C6-C7. Multilevel facet arthropathy. Edentulous patient.  IMPRESSION: 1. No acute radiographic abnormality of the cervical spine. 2. Severe multilevel degenerative disc disease and cervical spondylosis, as above.   Electronically Signed   By: Vinnie Langton M.D.   On: 07/26/2014 16:45   Dg Lumbar Spine Complete  07/26/2014   CLINICAL DATA:  Slip at home while getting out of chair today. Low back pain. Initial encounter.  EXAM: LUMBAR SPINE - COMPLETE 4+ VIEW  COMPARISON:  Abdominal radiographs 05/15/2014. CT abdomen and pelvis 07/20/2013.  FINDINGS: There are 5 non rib-bearing lumbar type vertebral bodies. Lumbar vertebral alignment is within normal limits. Lumbar vertebral body heights are preserved without evidence of compression fracture. Severe disc space height loss is again seen at L5-S1 with endplate sclerosis and osteophytosis. Intervertebral disc space heights are preserved elsewhere in the lumbar spine. Mild degenerative changes are noted in the lower thoracic spine. No pars defects are identified. Facet arthrosis is noted L5-S1. Right upper quadrant abdominal surgical clips are noted.  IMPRESSION: No acute osseous abnormality identified.  L5-S1 disc degeneration.   Electronically Signed   By: Logan Bores   On: 07/26/2014 16:50   Ct Head Wo Contrast  07/26/2014   CLINICAL DATA:  Mechanical fall upon getting up from couch. Initial encounter.  EXAM: CT HEAD WITHOUT CONTRAST  TECHNIQUE: Contiguous axial images were obtained from the base of the skull through the vertex without intravenous contrast.  COMPARISON:  07/20/2013  FINDINGS: There is a new 2.5 cm  region of relatively well-defined hypoattenuation involving right parietal cortex and subcortical white matter (series 2, image 19). A small, chronic infarct is again seen in the right MCA territory more anteriorly involving the anterior insula and inferior frontal lobe. There is mild global cerebral atrophy. Patchy periventricular white matter hypodensities elsewhere in both cerebral hemispheres are similar to the prior study and nonspecific but compatible with mild  chronic small vessel ischemic disease. There is no evidence of acute intracranial hemorrhage, mass, midline shift, or extra-axial fluid collection.  Prior bilateral cataract extraction is noted. 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 Prior right temporal fixation. No acute skull fracture is identified.  IMPRESSION: 1. No acute intracranial hemorrhage. 2. 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   Electronically Signed   By: Logan Bores   On: 07/26/2014 16:06   Mr Brain Wo Contrast  07/27/2014   CLINICAL DATA:  [place] from couch, headache and leg pain. New RIGHT parietal lesion noted on CT, here for further evaluation. History of hypertension, Sjogren's, memory loss/dementia.  EXAM: MRI HEAD WITHOUT CONTRAST  MRA HEAD WITHOUT CONTRAST  TECHNIQUE: Multiplanar, multiecho pulse sequences of the brain and surrounding structures were obtained without intravenous contrast. Angiographic images of the head were obtained using MRA technique without contrast.  COMPARISON:  CT head July 26, 2014 at 1558 hours.  FINDINGS: MRI HEAD FINDINGS  Faint reduced diffusion in RIGHT parietal lobe corresponding to CT abnormality, T2 shine through. No reduced diffusion to suggest acute ischemia. No midline shift or mass effect. Punctate susceptibility artifact in the LEFT frontal lobe associated with low T1 signal developmental venous anomaly. RIGHT frontal/anterior insula encephalomalacia. Patchy supratentorial LEFT pontine white  matter T2 hyperintensities.  No abnormal extra-axial fluid collections. Status post bilateral ocular lens implants. Atretic RIGHT maxillary sinus with mucosal thickening consistent with chronic sinusitis without acute component. The mastoid air cells are well aerated. Susceptibility artifact about the RIGHT posterior zygomatic arch corresponding to cerclage wires on recent CT. No abnormal sellar expansion. No cerebellar tonsillar ectopia. No suspicious calvarial bone marrow signal. Patient is edentulous.  MRA HEAD FINDINGS  Moderately motion degraded examination.  Flow related enhancement within the bilateral cervical, petrous, cavernous and supraclinoid and internal carotid arteries though due to motion, limited evaluation for aneurysm or stenosis. Flow related enhancement within the bilateral anterior and middle cerebral arteries with at least moderate stenosis proximal LEFT 1 segment suspected. LEFT anterior cerebral artery is dominant with diminutive, patent RIGHT anterior cerebral artery.  LEFT vertebral artery is dominant, the RIGHT vertebral artery terminates in the posterior inferior cerebellar artery. Flow related enhancement of basilar artery and posterior cerebral arteries.  No large vessel occlusion.  IMPRESSION: MRI HEAD: No acute intracranial process, specifically no acute ischemia.  Subacute to chronic RIGHT parietal lobe infarct, MCA territory. Old RIGHT frontal/insula encephalomalacia (MCA territory).  Mild to moderate white matter changes compatible with chronic small vessel ischemic disease.  Suspected LEFT frontal developmental venous anomaly could be confirmed on contrast-enhanced sequences as clinically indicated.  MRA HEAD: Moderately motion degraded examination without large vessel occlusion.  Moderate stenosis suspected of LEFT M1 origin. Diminutive RIGHT anterior cerebral artery likely on developmental basis.   Electronically Signed   By: Elon Alas M.D.   On: 07/27/2014 00:39   Dg  Knee Complete 4 Views Left  07/26/2014   CLINICAL DATA:  81 year old female with history of trauma from a fall earlier today complaining of left knee pain.  EXAM: LEFT KNEE - COMPLETE 4+ VIEW  COMPARISON:  No priors.  FINDINGS: Small suprapatellar effusion. No acute displaced fracture, subluxation or dislocation. Mild joint space narrowing, subchondral sclerosis and osteophyte formation, most evident in the medial and patellofemoral compartments.  IMPRESSION: 1. Small suprapatellar effusion. 2. Negative for fracture. 3. Mild degenerative changes of osteoarthritis, most pronounced in the medial and patellofemoral compartments.   Electronically Signed   By:  Vinnie Langton M.D.   On: 07/26/2014 16:47   Mr Jodene Nam Head/brain Wo Cm  07/27/2014   CLINICAL DATA:  [place] from couch, headache and leg pain. New RIGHT parietal lesion noted on CT, here for further evaluation. History of hypertension, Sjogren's, memory loss/dementia.  EXAM: MRI HEAD WITHOUT CONTRAST  MRA HEAD WITHOUT CONTRAST  TECHNIQUE: Multiplanar, multiecho pulse sequences of the brain and surrounding structures were obtained without intravenous contrast. Angiographic images of the head were obtained using MRA technique without contrast.  COMPARISON:  CT head July 26, 2014 at 1558 hours.  FINDINGS: MRI HEAD FINDINGS  Faint reduced diffusion in RIGHT parietal lobe corresponding to CT abnormality, T2 shine through. No reduced diffusion to suggest acute ischemia. No midline shift or mass effect. Punctate susceptibility artifact in the LEFT frontal lobe associated with low T1 signal developmental venous anomaly. RIGHT frontal/anterior insula encephalomalacia. Patchy supratentorial LEFT pontine white matter T2 hyperintensities.  No abnormal extra-axial fluid collections. Status post bilateral ocular lens implants. Atretic RIGHT maxillary sinus with mucosal thickening consistent with chronic sinusitis without acute component. The mastoid air cells are well aerated.  Susceptibility artifact about the RIGHT posterior zygomatic arch corresponding to cerclage wires on recent CT. No abnormal sellar expansion. No cerebellar tonsillar ectopia. No suspicious calvarial bone marrow signal. Patient is edentulous.  MRA HEAD FINDINGS  Moderately motion degraded examination.  Flow related enhancement within the bilateral cervical, petrous, cavernous and supraclinoid and internal carotid arteries though due to motion, limited evaluation for aneurysm or stenosis. Flow related enhancement within the bilateral anterior and middle cerebral arteries with at least moderate stenosis proximal LEFT 1 segment suspected. LEFT anterior cerebral artery is dominant with diminutive, patent RIGHT anterior cerebral artery.  LEFT vertebral artery is dominant, the RIGHT vertebral artery terminates in the posterior inferior cerebellar artery. Flow related enhancement of basilar artery and posterior cerebral arteries.  No large vessel occlusion.  IMPRESSION: MRI HEAD: No acute intracranial process, specifically no acute ischemia.  Subacute to chronic RIGHT parietal lobe infarct, MCA territory. Old RIGHT frontal/insula encephalomalacia (MCA territory).  Mild to moderate white matter changes compatible with chronic small vessel ischemic disease.  Suspected LEFT frontal developmental venous anomaly could be confirmed on contrast-enhanced sequences as clinically indicated.  MRA HEAD: Moderately motion degraded examination without large vessel occlusion.  Moderate stenosis suspected of LEFT M1 origin. Diminutive RIGHT anterior cerebral artery likely on developmental basis.   Electronically Signed   By: Elon Alas M.D.   On: 07/27/2014 00:39    Echocardiogram  Study Conclusions  - Left ventricle: The cavity size was normal. Wall thickness was increased in a pattern of mild LVH. Systolic function was normal. The estimated ejection fraction was in the range of 55% to 60%. Wall motion was normal;  there were no regional wall motion abnormalities. Left ventricular diastolic function parameters were normal. - Mitral valve: Calcified annulus. There was mild regurgitation. - Left atrium: The atrium was mildly dilated.   Filed Weights   07/26/14 2136  Weight: 64 kg (141 lb 1.5 oz)     Microbiology: No results found for this or any previous visit (from the past 240 hour(s)).     Blood Culture    Component Value Date/Time   SDES URINE, RANDOM 07/20/2013 1709   SPECREQUEST NONE 07/20/2013 1709   CULT  07/20/2013 1709    Multiple bacterial morphotypes present, none predominant. Suggest appropriate recollection if clinically indicated. Performed at [facility] 07/21/2013 FINAL 07/20/2013 1709  Labs: Results for orders placed or performed during the hospital encounter of 07/26/14 (from the past 48 hour(s))  Urinalysis, Routine w reflex microscopic (not at [facility])     Status: Abnormal   Collection Time: 07/26/14  3:47 PM  Result Value Ref Range   Color, Urine YELLOW YELLOW   APPearance CLOUDY (A) CLEAR   Specific Gravity, Urine 1.011 1.005 - 1.030   pH 5.0 5.0 - 8.0   Glucose, UA NEGATIVE NEGATIVE mg/dL   Hgb urine dipstick NEGATIVE NEGATIVE   Bilirubin Urine NEGATIVE NEGATIVE   Ketones, ur NEGATIVE NEGATIVE mg/dL   Protein, ur NEGATIVE NEGATIVE mg/dL   Urobilinogen, UA 0.2 0.0 - 1.0 mg/dL   Nitrite NEGATIVE NEGATIVE   Leukocytes, UA MODERATE (A) NEGATIVE  Urine microscopic-add on     Status: Abnormal   Collection Time: 07/26/14  3:47 PM  Result Value Ref Range   Squamous Epithelial / LPF FEW (A) RARE   WBC, UA 3-6 <3 WBC/hpf   Bacteria, UA MANY (A) RARE  CBC with Differential/Platelet     Status: None   Collection Time: 07/26/14  4:35 PM  Result Value Ref Range   WBC 6.5 4.0 - 10.5 K/uL   RBC 4.13 3.87 - 5.11 MIL/uL   Hemoglobin 12.7 12.0 - 15.0 g/dL   HCT 39.0 36.0 - 46.0 %   MCV 94.4 78.0 - 100.0 fL   MCH 30.8 26.0 - 34.0 pg    MCHC 32.6 30.0 - 36.0 g/dL   RDW 13.1 11.5 - 15.5 %   Platelets 217 150 - 400 K/uL   Neutrophils Relative % 65 43 - 77 %   Neutro Abs 4.2 1.7 - 7.7 K/uL   Lymphocytes Relative 22 12 - 46 %   Lymphs Abs 1.4 0.7 - 4.0 K/uL   Monocytes Relative 10 3 - 12 %   Monocytes Absolute 0.7 0.1 - 1.0 K/uL   Eosinophils Relative 3 0 - 5 %   Eosinophils Absolute 0.2 0.0 - 0.7 K/uL   Basophils Relative 0 0 - 1 %   Basophils Absolute 0.0 0.0 - 0.1 K/uL  I-stat troponin, ED     Status: None   Collection Time: 07/26/14  4:41 PM  Result Value Ref Range   Troponin i, poc 0.01 0.00 - 0.08 ng/mL   Comment 3            Comment: Due to the release kinetics of cTnI, a negative result within the first hours of the onset of symptoms does not rule out myocardial infarction with certainty. If myocardial infarction is still suspected, repeat the test at appropriate intervals.   I-stat chem 8, ed     Status: None   Collection Time: 07/26/14  4:42 PM  Result Value Ref Range   Sodium 142 135 - 145 mmol/L   Potassium 4.0 3.5 - 5.1 mmol/L   Chloride 103 101 - 111 mmol/L   BUN 16 6 - 20 mg/dL   Creatinine, Ser 0.70 0.44 - 1.00 mg/dL   Glucose, Bld 99 65 - 99 mg/dL   Calcium, Ion 1.22 1.13 - 1.30 mmol/L   TCO2 25 0 - 100 mmol/L   Hemoglobin 14.3 12.0 - 15.0 g/dL   HCT 42.0 36.0 - 46.0 %  Hemoglobin A1c     Status: Abnormal   Collection Time: 07/27/14  3:32 AM  Result Value Ref Range   Hgb A1c MFr Bld 6.3 (H) 4.8 - 5.6 %    Comment: (NOTE)  Pre-diabetes: 5.7 - 6.4         Diabetes: >6.4         Glycemic control for adults with diabetes: <7.0    Mean Plasma Glucose 134 mg/dL    Comment: (NOTE) Performed At: [facility] [place], [place] [id number] Lindon Romp MD BZ:[id number]   Lipid panel     Status: None   Collection Time: 07/27/14  3:32 AM  Result Value Ref Range   Cholesterol 157 0 - 200 mg/dL   Triglycerides 58 <150 mg/dL   HDL 48 >40 mg/dL   Total CHOL/HDL  Ratio 3.3 RATIO   VLDL 12 0 - 40 mg/dL   LDL Cholesterol 97 0 - 99 mg/dL    Comment:        Total Cholesterol/HDL:CHD Risk Coronary Heart Disease Risk Table                     Men   Women  1/2 Average Risk   3.4   3.3  Average Risk       5.0   4.4  2 X Average Risk   9.6   7.1  3 X Average Risk  23.4   11.0        Use the calculated Patient Ratio above and the CHD Risk Table to determine the patient's CHD Risk.        ATP III CLASSIFICATION (LDL):  <100     mg/dL   Optimal  100-129  mg/dL   Near or Above                    Optimal  130-159  mg/dL   Borderline  160-189  mg/dL   High  >190     mg/dL   Very High      Lipid Panel     Component Value Date/Time   CHOL 157 07/27/2014 0332   TRIG 58 07/27/2014 0332   HDL 48 07/27/2014 0332   CHOLHDL 3.3 07/27/2014 0332   VLDL 12 07/27/2014 0332   LDLCALC 97 07/27/2014 0332   LDLDIRECT 65.7 07/08/2010 1335     Lab Results  Component Value Date   HGBA1C 6.3* 07/27/2014   HGBA1C 5.7 05/16/2012   HGBA1C 5.7* 07/18/2011     Lab Results  Component Value Date   LDLCALC 97 07/27/2014   CREATININE 0.70 07/26/2014     HPI : 81 y.o. female with a past medical history that is relevant for HTN, Sjogren's syndrome, dementia, fibromyalgia, chronic back pain, transferred to [facility] for further stroke evaluation and management. Patient was initially brought to [facility]ED after a fall. Mrs. Ruddy tells me that she had had prior falls, but with this last fall she recalls trying to get up from a couch to go to the bathroom, and then she slipped down onto the ground and was having difficulty getting up. She has to crawl to a phone to call her neighbor who contacted EMS to bring her here. Denies having CP, SOB, palpitations, vertigo, lightheadedness before falling. She denies losing consciousness or abnormal movements while in the floor. Complains of having a HA and legs pain. CT brain was performed in the ED and revealed a new 2.5 cm region  of relatively well-defined hypoattenuation involving right parietal cortex and subcortical white matter. Last known well unable to determine. Patient was not administered TPA secondary to out of the window. She was admitted to the neuro ICU for further evaluation  and treatment.   HOSPITAL COURSE:  Multiple falls MRI shows subacute to chronic right parietal lobe infarct in the MCA territory, old right frontal encephalomalacia MRA without large vessel occlusion Patient was taking aspirin 81 mg a day, being on 325 mg a day LDL 97, 2-D echo with results as above PT recommends SNF, but patient would like to go home with home health 2-D echo, carotid Doppler pending patient has presented with an unexplained fall without any focal symptoms or findings on exam. MRI scan of the brain doesn't not show any acute stroke even though CT scan raised suspicion for one. She however has atrial fibrillation and a prior silent stroke and hence remains at risk for recurrent strokes, TIAs, neurological worsening and needs ongoing stroke evaluation and aggressive risk factor modification. She is a poor candidate for long-term anticoagulation given multiple falls and dementia and hence recommend aspirin for stroke prevention.  Atrial Fibrillation,CHA2DS2-VASc Score now = 6, new diagnosis of May 2016 Currently not on anticoagulation:  given poor medication compliance, ongoing falls Pt a poor anticoagulation candidate given above reasons, admitted for fall this time, continue aspirin 325 mg daily   Hypertension continue cardizem, metoprolol  Gastroesophageal reflux disease on Nexium at home  Chronic low back pain syndrome and fibromyalgia, Sjogren's syndrome-able  Anxiety and depression, continue xanax , Cymbalta ,  Peripheral neuropathy continue Lyrica  Dementia  stable    Discharge Exam:    Blood pressure 126/53, pulse 58, temperature 98.7 F (37.1 C), temperature source Oral, resp. rate 20, height 5'  3" (1.6 m), weight 64 kg (141 lb 1.5 oz), SpO2 96 %.  General: No acute respiratory distress Lungs: Clear to auscultation bilaterally without wheezes or crackles Cardiovascular: Regular rate and rhythm without murmur gallop or rub normal S1 and S2 Abdomen: Nontender, nondistended, soft, bowel sounds positive, no rebound, no ascites, no appreciable mass Extremities: No significant cyanosis, clubbing, or edema bilateral lower extremities       Discharge Instructions    Diet - low sodium heart healthy    Complete by:  As directed      Increase activity slowly    Complete by:  As directed            Follow-up Information    Follow up with REED, TIFFANY, DO. Schedule an appointment as soon as possible for a visit in 1 week.   Specialty:  Geriatric Medicine   Contact information:   [address] [place] [id number] [phone]       Signed: Reyne Dumas 07/28/2014, 1:08 PM        Time spent >45 mins

## 2014-07-29 DIAGNOSIS — I10 Essential (primary) hypertension: Secondary | ICD-10-CM | POA: Diagnosis not present

## 2014-07-29 DIAGNOSIS — F0390 Unspecified dementia without behavioral disturbance: Secondary | ICD-10-CM | POA: Diagnosis not present

## 2014-07-29 DIAGNOSIS — M797 Fibromyalgia: Secondary | ICD-10-CM | POA: Diagnosis not present

## 2014-07-29 DIAGNOSIS — M1990 Unspecified osteoarthritis, unspecified site: Secondary | ICD-10-CM | POA: Diagnosis not present

## 2014-07-29 DIAGNOSIS — I4891 Unspecified atrial fibrillation: Secondary | ICD-10-CM | POA: Diagnosis not present

## 2014-07-29 DIAGNOSIS — R296 Repeated falls: Secondary | ICD-10-CM | POA: Diagnosis not present

## 2014-07-30 NOTE — Clinical Social Work Note (Signed)
Clinical Social Work Assessment  Patient Details  Name: Ariana [name] MRN: [id number] Date of Birth: [birth year]  Date of referral:  07/30/14               Reason for consult:  Facility Placement, Discharge Planning                Permission sought to share information with:  Case Manager, Facility Sport and exercise psychologist, PCP, Family Supports Permission granted to share information::  Yes, Verbal Permission Granted  Name::      [name] )  Agency::   (SNF's )  Relationship::   (Daughter )  Contact Information:   [phone])  Housing/Transportation Living arrangements for the past 2 months:  Single Family Home Source of Information:  Patient, Adult Children Patient Interpreter Needed:  None Criminal Activity/Legal Involvement Pertinent to Current Situation/Hospitalization:  No - Comment as needed Significant Relationships:  Adult Children, Friend, Other Family Members, Neighbor Lives with:  Self Do you feel safe going back to the place where you live?  Yes Need for family participation in patient care:  Yes (Comment)  Care giving concerns:  Pt's daughter concerned with patient returning home alone.    Social Worker assessment / plan:  Holiday representative met with patient and pt's dtr, Guynell several times throughout the day in reference post-acute placement for SNF. CSW introduced CSW role and SNF placement. Pt's dtr reported that pt has 3 private caregivers at home. Pt's dtr also expressed several concerns in regards to pt returning home alone. Pt's dtr reported pt has mild Dementia and is afraid she will be placed within a facility long-term. Pt's dtr stated she is pt's HCPOA and is strongly considering long-term placement within an ALF. Pt's dtr further expressed she is dealing with her own health issues and it has become challenging caring for her mother as well.   CSW met with pt separately as she reported she has lost her husband and son recently and just wants to stay  in her home that she has worked very hard for. Pt further reported she has been a [place] in the past and acknowledged she has benefited form continued therapy however does not wish to transition to a skilled level of care. Pt became tearful during the assessment and repeated several times that she deserves to live in her home which she has worked hard for over the years. CSW related pt's response to pt's dtr and dtr stated she will continue to provide private caregivers and plans to look into placement within an ALF.   MD aware and has discharged patient home. RNCM notified. No further concerns reported by family at this time. No further social intervention required. CSW signing off.   Employment status:  Retired Forensic scientist:  Medicare PT Recommendations:  [place] / Referral to community resources:  [place]  Patient/Family's Response to care: Pt sitting up in bed alert and oriented x4. Pt tearful during assessment and afraid of losing her independence. Pt's dtr overwhelmed and hopeful to placement pt in ALF sooner than later. Pt's dtr pleasant and appreciated social work intervention. Pt refusing SNF placement.   Patient/Family's Understanding of and Emotional Response to Diagnosis, Current Treatment, and Prognosis:  Pt does not understand safety concerns of returning home. Pt's dtr understands pt may need a higher level of care and additional assistance.   Emotional Assessment Appearance:  Appears stated age Attitude/Demeanor/Rapport:  Reactive, Complaining, Avoidant Affect (typically  observed):  Defensive, Apprehensive, Blunt, Afraid/Fearful Orientation:  Oriented to Self, Oriented to Place, Oriented to  Time, Oriented to Situation Alcohol / Substance use:  Never Used Psych involvement (Current and /or in the community):  No (Comment)  Discharge Needs  Concerns to be addressed:  Basic Needs, Home Safety  Concerns Readmission within the last 30 days:  No Current discharge risk:  Dependent with Mobility Barriers to Discharge:  Unsafe home situation   Glendon Axe, MSW, LCSWA [phone]

## 2014-08-10 NOTE — Patient Outreach (Signed)
[facility]) Care Management  08/10/2014  Ariana [name] 05/29/1929 [id number]   RED on EMMI Stroke Dashboard, assigned [name], RN to outreach.  [name]. [place], [place] Management [place] Assistant Phone: [phone] Fax: [phone]

## 2014-08-11 ENCOUNTER — Other Ambulatory Visit: Payer: Self-pay

## 2014-08-11 DIAGNOSIS — I639 Cerebral infarction, unspecified: Secondary | ICD-10-CM

## 2014-08-11 NOTE — Patient Outreach (Addendum)
[facility]) Care Management  08/11/2014  Ariana [name] March 29, 1929 [id number]  CONSENT: Per daughter patient signed request in hospital to participate in EMMI stroke transition program. Per EPIC NOTE:  dpr on file for Therese Rocco (daughter) 07/14/11 [phone] *CONTACT HER*  SUBJECTIVE: Telephone call to patients daughter, Ariana [name] as requested per [facility] record.  Daughter states in response to EMMI stroke automated outreach, patient has had and has been treated for depression since 2013.  Daughter states patient is seen by her primary MD for medication adjustments due to depression.  Daughter states patient loss her son in 2012.  States patient becomes easily frustrated /agitated due to the dementia.  Daughter states patient has follow up appointment with cardiologist, Dr. Debara Pickett on 08/12/14.  Daughter states she contacted patients primary MD post discharged but she was instructed not to follow up with primary MD at this time and keep scheduled appointment with cardiologist.  Daughter states patient is able to obtain and takes medication as prescribed by doctor. Daughter states patient has transportation to and from doctor appointments.  Daughter states patient lives alone and has caregivers from [place] to 5pm daily.  Daughter states patient is alone in the evening with follow up phone calls and visits from her and her husband.  Daughter states she and her husband manage patients medications.  Daughter states patient has reported several falls within the past several months.  States patient uses cane and walker for ambulation. This RNCM discussed and offered [facility] care mangement community nurse follow up for patient.  Daughter refused services at this time stating patient is being followed by [place] home health for RN, PT services. Daughter states patient has neuropathy in both feet.   Daughter denies patient reporting any signs/symptoms of stroke.  Daughter in agreement to  received educational material regarding stroke and  Having follow phone call from [facility] telephonic case manager.  Daughter request additional information regarding Advance directive.   ASSESSEMENT: EMMI stroke transition program (Day #9 Stroke coping).  Patient recent hospital admission  07/26/14 top 07/28/14.  Daughter reports patient with history of falls. Patient receiving home health services from [place] for RN, PT. Discussed with patients daughter importance of keeping patients follow up doctor appointments and taking medications as prescribed.  PLAN: RNCM will follow up with patient/daughter within 1 week. RNCM will refer patient/daugter to social worker for advance directive.  RNCM will send patients daughter EMMI education material related to Recovering after stroke and signs of stroke  [name] [facility] [place] Coordinator [phone]

## 2014-08-11 NOTE — Patient Outreach (Signed)
[facility]) Care Management  08/11/2014  Ariana [name] 12/28/1929 [id number]   Request from [name], RN to assign SW, assigned [name], LCSW to outreach.  [name]. [place], [place] Management [place] Assistant Phone: [phone] Fax: [phone]

## 2014-08-12 ENCOUNTER — Ambulatory Visit (INDEPENDENT_AMBULATORY_CARE_PROVIDER_SITE_OTHER): Payer: Medicare Other | Admitting: Internal Medicine

## 2014-08-12 ENCOUNTER — Ambulatory Visit (HOSPITAL_COMMUNITY)
Admission: RE | Admit: 2014-08-12 | Discharge: 2014-08-12 | Disposition: A | Discharge status: Home / Self Care | Payer: Medicare Other | Source: Ambulatory Visit | Attending: Internal Medicine | Admitting: Internal Medicine

## 2014-08-12 ENCOUNTER — Encounter: Payer: Self-pay | Admitting: Internal Medicine

## 2014-08-12 VITALS — BP 124/76 | HR 68 | Ht 65.0 in | Wt 147.0 lb

## 2014-08-12 DIAGNOSIS — F0390 Unspecified dementia without behavioral disturbance: Secondary | ICD-10-CM

## 2014-08-12 DIAGNOSIS — I4891 Unspecified atrial fibrillation: Secondary | ICD-10-CM | POA: Diagnosis not present

## 2014-08-12 DIAGNOSIS — R609 Edema, unspecified: Secondary | ICD-10-CM | POA: Insufficient documentation

## 2014-08-12 DIAGNOSIS — F03A0 Unspecified dementia, mild, without behavioral disturbance, psychotic disturbance, mood disturbance, and anxiety: Secondary | ICD-10-CM

## 2014-08-12 DIAGNOSIS — I10 Essential (primary) hypertension: Secondary | ICD-10-CM | POA: Diagnosis not present

## 2014-08-12 DIAGNOSIS — I6389 Other cerebral infarction: Secondary | ICD-10-CM

## 2014-08-12 DIAGNOSIS — I638 Other cerebral infarction: Secondary | ICD-10-CM | POA: Diagnosis not present

## 2014-08-12 MED ORDER — HYDROCHLOROTHIAZIDE 12.5 MG PO CAPS: 12.5000 mg | ORAL_CAPSULE | Freq: Every day | ORAL | Status: DC

## 2014-08-12 NOTE — Patient Instructions (Addendum)
Dr. Debara Pickett has ordered a left leg venous doppler  Dr. Debara Pickett recommends that you obtain compression stockings for your left leg - 20-42mmHg  Your physician has recommended you make the following change in your medication: START hydrochlorothaizide 12.5mg  once daily   Your physician recommends that you schedule a follow-up appointment in: 2 months with Dr. Debara Pickett

## 2014-08-12 NOTE — Progress Notes (Signed)
OFFICE NOTE  Chief Complaint:  Follow-up stroke   Primary Care Physician: Hollace Kinnier, DO  HPI:  Ariana [name] an 81 year old female with history of some confusion in the past, hypertension and Sjogren syndrome. I had added lisinopril to her regimen which has helped control her blood pressure much better. She does report good control over her blood pressure over the past year; however, has had recent worsening problems such as cough, new productive sputum, problems with urinary tract infections. Weight is up back to 126 pounds after dip to 116 pounds but this is about where she is normally with a good appetite. Blood pressure is noted to be mildly elevated today; however, she is in some discomfort and has been short of breath with productive cough. Denies any chest pain. She does have pain in her back that shoots to her right thigh when sitting or leaning down. Today she has a number of other complaints. Her main issue is palpitations which she reports she had an episode of fast heart rate for almost one month but it has somewhat resolved. She still gets some recurrent palpitations.  I saw Ariana [name] back in the office today. She was recently seen by [name] and according to her notes his had marked progression of her dementia. She's now having significant behavioral disturbance as well as wandering. This is cause significant stress for her daughter and family. Compliance with medications is poor. There have been falls at home. Today in the office she is noted to be in new onset atrial fibrillation with rapid ventricular response. Heart rate is in the 120s. I had a long discussion with her daughter and her to the extent that she could understand about atrial fibrillation and her increased risk for stroke. Her CHADSVASC score is elevated at 4. She's also complaining of some abdominal discomfort but no significant chest pain. Blood pressure was low today at 90/70.  Unfortunately Ariana [name]  returns today for follow-up. She was recently hospitalized after an episode of transient global amnesia. She was ultimately found to have stroke with 2 clear punctate lesions on imaging. Is felt to be cardioembolic and may be related to her atrial fibrillation. She continues to have falls and in fact the fall was her initial presentation. As we discussed at length before she is at very high risk for complications on increased anticoagulation such as warfarin or a direct oral anticoagulant. Despite her CHADSVASC score now of 6, it is felt that anticoagulation is too risky. She has had an increase in her aspirin from 81 mg to 325 mg.  PMHx:  Past Medical History  Diagnosis Date  . Sjogren's syndrome   . Dry eye syndrome   . Hypertension, benign   . Mitral valve prolapse   . GERD (gastroesophageal reflux disease)   . Diverticulosis of colon   . Irritable bowel syndrome   . Urinary incontinence   . Low back pain syndrome   . Fibromyalgia   . Memory loss   . Anxiety and depression   . History of adverse drug reaction   . Peripheral neuropathy     "both feet and legs"  . Shortness of breath 07/18/11    "alot lately"  . Anginal pain   . History of recurrent UTIs   . H/O hiatal hernia   . Anxiety   . Dementia   . Depression   . Abnormality of gait   . Thyroid nodule   . Headache(784.0) 09/05/2012  .  Adenomatous polyp of colon 2002    47mm    Past Surgical History  Procedure Laterality Date  . Vesicovaginal fistula closure w/ tah    . Appendectomy    . Cholecystectomy  2000  . Mandible surgery    . Temporomandibular joint surgery  1986    Dr. Terence Lux  . Cataract extraction, bilateral    . Abdominal hysterectomy  1967  . Dental surgery      multiple tooth extractions  . Esophagogastroduodenoscopy (egd) with esophageal dilation N/A 08/23/2012    Procedure: ESOPHAGOGASTRODUODENOSCOPY (EGD) WITH ESOPHAGEAL DILATION;  Surgeon: Milus Banister, MD;  Location: [facility] ENDOSCOPY;  Service:  Endoscopy;  Laterality: N/A;  . Colonoscopy w/ biopsies      multiple  . Nasal septum surgery  1980  . Transthoracic echocardiogram  2001    mild LVH, normal LV  . Nm myocar perf wall motion  2003    persantine - normal static and dynamic study w/apical thinning and presvered LV function, no ischemia  . Cardiac catheterization  02/17/2003    normal L main, LAD free of disease, Cfx free of disease, RCA free of disease (Dr. Rockne Menghini)    FAMHx:  Family History  Problem Relation Age of Onset  . Heart disease Father     heart attack  . Pneumonia Mother   . Heart attack Mother   . Hypertension Mother   . Hypertension Maternal Grandmother   . Colon cancer Sister   . Kidney disease Daughter   . Asthma Daughter   . Arthritis Daughter 55    osteo,  . Heart disease Son 36    stage 3 CHF(Diastolic /Systolic)  . Throat cancer Brother     SOCHx:   reports that she has quit smoking. She has never used smokeless tobacco. She reports that she does not drink alcohol or use illicit drugs.  ALLERGIES:  Allergies  Allergen Reactions  . Banana Nausea And Vomiting  . Codeine Nausea Only    unless given with Phenergan  . Doxycycline     Unknown  . Klonopin [Clonazepam]     Causes hallucination   . Meperidine Hcl Nausea Only    unless given with Phenergan  . Naproxen   . Norflex [Orphenadrine Citrate] Nausea Only    Unless given with Phenergan  . Oxycodone-Acetaminophen Nausea Only    unless given with phenergan  . Penicillins     Unknown  . Phenothiazines     Unknown  . Propoxyphene Hcl Nausea Only    unless given with phenergan  . Stelazine     Unknown  . Sulfamethoxazole-Trimethoprim     Unknown  . Tolectin [Tolmetin Sodium]     Unknown  . Tramadol     Unknown  . Zoloft [Sertraline Hcl]     Caused pt to sleep a lot    ROS: A comprehensive review of systems was negative except for: Constitutional: positive for fatigue Cardiovascular: positive for  palpitations Gastrointestinal: positive for abdominal pain  HOME MEDS: Current Outpatient Prescriptions  Medication Sig Dispense Refill  . ALPRAZolam (XANAX) 0.5 MG tablet Take one tablet by mouth twice daily for anxiety 30 tablet 0  . aspirin 325 MG tablet Take 1 tablet (325 mg total) by mouth daily. 30 tablet 2  . bisacodyl (BISACODYL) 5 MG EC tablet Take 2 tablets (10 mg total) by mouth daily as needed for moderate constipation. If no effective bowel movement every 2- 3 days (Patient taking differently: Take 5-10 mg by  mouth daily as needed for moderate constipation. If no effective bowel movement every 2- 3 days) 30 tablet 0  . CALCIUM CARBONATE PO Take 1 tablet by mouth daily.    . carboxymethylcellulose (REFRESH TEARS) 0.5 % SOLN 1 drop 2 (two) times daily as needed (dry eyes).     . cetirizine (ZYRTEC) 10 MG tablet Take 1 tablet (10 mg total) by mouth daily. 14 tablet 0  . CRANBERRY SOFT PO Take 1 capsule by mouth daily.    Marland Kitchen diltiazem (CARDIZEM CD) 180 MG 24 hr capsule Take 1 capsule (180 mg total) by mouth daily. 30 capsule 6  . DULoxetine (CYMBALTA) 30 MG capsule Take one tablet  once daily in the morning for depression 30 capsule 3  . DULoxetine (CYMBALTA) 60 MG capsule Take one tablet by mouth in the evening once daily for depression 30 capsule 3  . famotidine (PEPCID) 20 MG tablet Take 1 tablet (20 mg total) by mouth 2 (two) times daily as needed for heartburn. 60 tablet 1  . HYDROcodone-acetaminophen (NORCO/VICODIN) 5-325 MG per tablet Take 1 tablet by mouth 2 (two) times daily as needed for moderate pain. 30 tablet 0  . hydroxychloroquine (PLAQUENIL) 200 MG tablet Take 200 mg by mouth daily.     Marland Kitchen lactose free nutrition (BOOST PLUS) LIQD Take 237 mLs by mouth 2 (two) times daily between meals.  0  . lansoprazole (PREVACID) 30 MG capsule Take 1 capsule (30 mg total) by mouth daily at 12 noon. 30 capsule 5  . lubiprostone (AMITIZA) 24 MCG capsule Take 1 capsule (24 mcg total) by  mouth 2 (two) times daily with a meal. 60 capsule 11  . metoprolol (LOPRESSOR) 50 MG tablet Take 1 tablet (50 mg total) by mouth 2 (two) times daily. 60 tablet 6  . NAMENDA XR 28 MG CP24 24 hr capsule TAKE ONE CAPSULE BY MOUTH EVERY DAY TO PRESERVE MEMORY 30 capsule 3  . nystatin (MYCOSTATIN) 100000 UNIT/ML suspension TAKE 1 TEASPOONFUL BY MOUTH 4 TIMES A DAY (Patient taking differently: TAKE 1 TEASPOONFUL BY MOUTH 4 TIMES A DAY as needed for mouth pain) 60 mL 5  . pilocarpine (SALAGEN) 5 MG tablet Take 5 mg by mouth 2 (two) times daily.    . pravastatin (PRAVACHOL) 20 MG tablet Take 1 tablet (20 mg total) by mouth daily at 6 PM. 30 tablet 2  . pregabalin (LYRICA) 75 MG capsule Take 1 capsule (75 mg total) by mouth 2 (two) times daily. 60 capsule 3  . sodium chloride (OCEAN) 0.65 % SOLN nasal spray Place 1 spray into the nose as needed for congestion. 30 mL 0  . triamcinolone ointment (KENALOG) 0.1 % Apply 1 application topically 2 (two) times daily as needed (rash).   1  . hydrochlorothiazide (MICROZIDE) 12.5 MG capsule Take 1 capsule (12.5 mg total) by mouth daily. 30 capsule 11   No current facility-administered medications for this visit.    LABS/IMAGING: No results found for this or any previous visit (from the past 48 hour(s)). No results found.  VITALS: BP 124/76 mmHg  Pulse 68  Ht 5\' 5"  (1.651 m)  Wt 147 lb (66.679 kg)  BMI 24.46 kg/m2  EXAM: Deferred  EKG: Deferred  ASSESSMENT: 1. New-onset atrial fibrillation with rapid ventricular response - CHADSVASC 6 2. Recent stroke 3. Hypertension-now hypotensive 4. Chest wall pain 5. Palpitations 6. Sjogren syndrome 7. Progressive dementia with behavioral disturbance 8. Falls and medication noncompliance 9. Asymmetric left lower extremity edema  PLAN: 1.  Ariana [name] has had progressive dementia with behavioral disturbance over the last year. There've been recommendations for placement in a memory unit however she has been  resistant to this. There is a fall which caused the need for sutures above her right eye. She has had problems with medication compliance and her medications are now arranged by her family members. Today she is found to be in A. fib with RVR and has a high CHADSVASC score of 4. Normally anticoagulation would be recommended, however given her circumstances with progressive and significant dementia as well as medication noncompliance, disruptive behavior and falls, I do not feel she is a safe candidate to be on anticoagulation. I discussed the risks of this including the possible risk of stroke which is elevated with her and her daughter who is present. They understand the dangers of blood thinners in this situation. Based on the fact that it would be unsafe to use a blood thinner, pursuing a cardioversion or trying to get her heart back into sinus rhythm would not be possible without blood thinners. For now, we'll focus on rate control. I recommend discontinuing her lisinopril and increasing Cardizem up to 180 mg daily.  This is an unfortunate situation and is very difficult on her family members. Hopefully with further guidance from her primary care provider she could get placement in full-time care which she definitely needs. Plan to see her back in one month to evaluate her rate control.  UPDATE: As I was fearing, Ariana [name] did have stroke with transient global amnesia which actually improved somewhat. Her CHADSVASC score is now 6, however she still has significant falls, dementia, medication noncompliance and disruptive behavior which make anticoagulation very high risk for her. She's been switched to full dose aspirin and I would recommend we continue on that course. She is also complaining of some left lower extremity swelling today. I would recommend we check a venous Doppler in the office to rule out DVT. Otherwise I recommend starting low-dose HCTZ 12.5 mg daily for her swelling and have suggested she  get a compression stocking for left lower extremity.  DOPPLER WAS NEGATIVE FOR DVT  Pixie Casino, MD, [facility] Attending Cardiologist [place] 08/12/2014, 6:28 PM

## 2014-08-13 ENCOUNTER — Encounter (HOSPITAL_COMMUNITY): Payer: Medicare Other

## 2014-08-14 NOTE — Patient Outreach (Signed)
[facility]) Care Management  08/14/2014  Ariana [name] 06/12/1929 [id number]   Notification received from Nunzio Cobbs, LCSW to send [name]'s daughter, [name], a copy of an advanced directive. A copy was mailed out on 08/14/2014.  [name] L. Elbert Ew[facility] Care Management Assistant [phone]

## 2014-08-15 ENCOUNTER — Other Ambulatory Visit: Payer: Self-pay | Admitting: Internal Medicine

## 2014-08-18 ENCOUNTER — Other Ambulatory Visit: Payer: Self-pay

## 2014-08-18 ENCOUNTER — Other Ambulatory Visit: Payer: Self-pay | Admitting: *Deleted

## 2014-08-18 MED ORDER — PREGABALIN 75 MG PO CAPS: 75.0000 mg | ORAL_CAPSULE | Freq: Two times a day (BID) | ORAL | Status: DC

## 2014-08-18 NOTE — Progress Notes (Signed)
Late entry Speech Pathology from 07/28/14 evaluation  07/28/14 1100  SLP Visit Information  SLP Received On 07/28/14  SLP Time Calculation  SLP Start Time (ACUTE ONLY) 1417  SLP Stop Time (ACUTE ONLY) 1432  SLP Time Calculation (min) (ACUTE ONLY) 15 min  Subjective  Subjective Mildly irritable  General Information  Other Pertinent Information 81 yo female s/p fall from couch. Pt with x3 days of falls. MRI (+) Subacute to chronic RIGHT parietal lobe infarct, MCA territory. Old RIGHT frontal/insula encephalomalacia (MCA territory) PMH: fibromyalgias, chronic back pain, HTN, dementia, abnormal gait, Sjogren's syndrome.  Prior Functional Status  Type of Home Apartment  Lives With Alone  Available Help at Discharge Friend(s);Personal care attendant;Available PRN/intermittently;Family  Pain Assessment  Pain Assessment Faces  Faces Pain Scale 4  Pain Location L leg upon standing  Pain Descriptors / Indicators Grimacing;Sore  Pain Intervention(s) Monitored during session;Repositioned  Cognition  Overall Cognitive Status Within Functional Limits for tasks assessed  Orientation Level Oriented X4  Attention Sustained  Sustained Attention Appears intact  Memory Impaired  Memory Impairment Decreased short term memory;Retrieval deficit  Decreased Short Term Memory Verbal basic  Awareness Impaired  Problem Solving Appears intact (for basic tasks)  Auditory Comprehension  Overall Auditory Comprehension Appears within functional limits for tasks assessed  Visual Recognition/Discrimination  Discrimination [facility]  Reading Comprehension  Reading Status Not tested  Expression  Primary Mode of Expression Verbal  Verbal Expression  Overall Verbal Expression Appears within functional limits for tasks assessed  Written Expression  Dominant Hand Right  Written Expression Not tested  Oral Motor/Sensory Function  Overall Oral Motor/Sensory Function Appears within functional limits for tasks assessed   Motor Speech  Overall Motor Speech Appears within functional limits for tasks assessed  SLP - End of Session  Patient left in bed  Assessment  Therapy Diagnosis Cognitive Impairments (at baseline)  Clinical Impression Statement Pt presents with functional/baseline cognition with caregivers available during the day.  Speech/language WNL.  Short-term recall deficits present. Pt at baseline - nof SLP f/u recommended.   SLP Recommendation/Assessment Patient does not need any further Speech Lanaguage Pathology Services  No Skilled Speech Therapy Patient at baseline level of functioning  SLP Recommendations  Follow up Recommendations None  Individuals Consulted  Consulted and Agree with Results and Recommendations Patient  SLP G-Codes **NOT FOR INPATIENT CLASS**  Functional Assessment Tool Used clinical judgement  Functional Limitations Memory  Memory Current Status (N8295) CK  Memory Goal Status (A2130) CK  Memory Discharge Status (G9170) CK  SLP Evaluations  $ SLP Speech Visit 1 Procedure  SLP Evaluations  $ SLP EVAL LANGUAGE/SOUND PRODUCTION 1 Procedure

## 2014-08-18 NOTE — Patient Outreach (Signed)
[facility]) Care Management  08/18/2014  JUANISHA BAUTCH 31-May-1929 [id number]   SUBJECTIVE:  Telephone call to patient's daughter/caregiver regarding EMMI stroke follow up.  Daughter states patient has been extremely tired.  Daughter states patient kept follow up appointment with Dr. Debara Pickett.   States patient has been placed on Hydrochlorothiazide and was given an order for compression stockings.  Daughter states patient has started on new medication but have not gotten compression stockings at this time. Daughter states patient does not have a scheduled appointment as of yet for her primary care provider but she will be calling on this week to set up.  Daughter states patient had an ultrasound of her right leg due to swelling.  States normal ultrasound.  Daughter states patient has been seen by her gastroenterologist and was placed on medication to assist with abdominal swelling.  Daughter states patient has had numerous test done for the abdominal swelling historically.  Daughter states patient has follow up appointment scheduled for [place] 31, 2016 and follow up appointment scheduled with neurologist, Dr. Jannifer Franklin  September 24, 2014.  Daughter states patient has not received EMMI education material from [facility] as of yet.  Daughter states patient is taking her medication as directed by her doctor. Daughter states her and her husband make sure patient is getting her medications. Daughter states patient continues with therapy and caregiver assistance.  Patient daughter agrees to telephonic follow up appointment with RNCM within 1 week.    ASSESSMENT: continued EMMI stroke transition program follow up week #2  PLAN: RNCM will follow up with patient/ caregiver within 1 week.  Patient's daughter will report that EMMI education material has been received at next outreach.  Daughter will report that patient is taking her medications consistently and as prescribed at next telephone outreach.   Daughter will report she has received Advance directive and/or follow up phone call from [facility] care management social worker.   [name] RN,BSN,CCM [place] Coordinator [phone]

## 2014-08-18 NOTE — Progress Notes (Signed)
OT NOTE- LATE GCODE ENTRY     08-07-2014 1100  OT G-codes **NOT FOR INPATIENT CLASS**  Functional Assessment Tool Used clinical judgement  Functional Limitation Self care  Self Care Current Status [phone]) CJ  Self Care Goal Status (F9012) CI  Self Care Discharge Status (Q2411) Elsie Stain   OTR/L Pager: [phone] Office: [phone] .

## 2014-08-19 NOTE — Progress Notes (Signed)
PT Treatment Addendum: adding G codes    04-Aug-2014 1128  PT Time Calculation  PT Start Time (ACUTE ONLY) 1048  PT Stop Time (ACUTE ONLY) 1118  PT Time Calculation (min) (ACUTE ONLY) 30 min  PT G-Codes **NOT FOR INPATIENT CLASS**  Functional Assessment Tool Used Clinical Judgement  Functional Limitation Mobility: Walking and moving around  Mobility: Walking and Moving Around Current Status (O0355) CJ  Mobility: Walking and Moving Around Goal Status (H7416) CI  PT General Charges  $$ ACUTE PT VISIT 1 Procedure  PT Evaluation  $Initial PT Evaluation Tier I 1 Procedure  PT Treatments  $Gait Training 8-22 mins   Joslyn Hy PT, DPT [phone] Pager: [phone]

## 2014-08-21 ENCOUNTER — Telehealth: Payer: Self-pay | Admitting: *Deleted

## 2014-08-21 ENCOUNTER — Other Ambulatory Visit: Payer: Self-pay | Admitting: *Deleted

## 2014-08-21 ENCOUNTER — Other Ambulatory Visit: Payer: Self-pay | Admitting: Internal Medicine

## 2014-08-21 MED ORDER — HYDROCODONE-ACETAMINOPHEN 5-325 MG PO TABS: 1.0000 | ORAL_TABLET | Freq: Two times a day (BID) | ORAL | Status: DC | PRN

## 2014-08-21 NOTE — Telephone Encounter (Signed)
Spoke with daughter regarding medication refill , she stated that she was given some medication in the Ed but they didn't use the script because it was not enough medication. So now they are in need of a refill on her hydrocodone medication. i informed her that I could only refill her medications if she brings in those old scripts from the ED. She stated that she would have her husband bring in those scripts and pick up the new one.

## 2014-08-24 ENCOUNTER — Other Ambulatory Visit: Payer: Medicare Other

## 2014-08-24 ENCOUNTER — Encounter: Payer: Self-pay | Admitting: *Deleted

## 2014-08-24 ENCOUNTER — Other Ambulatory Visit: Payer: Self-pay | Admitting: *Deleted

## 2014-08-24 NOTE — Patient Outreach (Signed)
[facility]) Care Management  08/24/2014  Ariana [name] 1929/12/20 [id number]  CSW was able to thoroughly review note performed by CSW colleague, Theadore Nan, providing CSW coverage in writer's absence.  Mr. Ariana [name] was able to converse with patient's daughter, Ariana [name], who reports being interested in completing patient's Advanced Directives ([place] documents).  CSW also performed a thorough review of patient's EMR (Electronic Medical Record) in EPIC to try and familiarize self with patient, as well as patient's current cognitive ability and level of functioning, after noting patient's diagnosis of Dementia.  CSW will need to consult with patient's Neurologist, Dr. Garvin Fila, to ensure that patient has the capacity to complete Advanced Directives appropriately and legally.  [name] last evaluated patient on June 13th, while patient was hospitalized for a recent fall, at which time a CT of patient's brain was performed. CSW made an initial attempt to try and contact patient's daughter, Ariana [name] today to perform phone assessment, as well as assess and assist with social work needs and services; however, Mrs. Ariana [name] was unavailable at the time of CSW's call.  A HIPAA compliant message was left for [name] on [employer].  CSW is currently awaiting a return call.  [name], BSW, MSW, [place] [facility] [place], [place], [place] [address] [name] [phone]

## 2014-08-25 ENCOUNTER — Encounter: Payer: Self-pay | Admitting: *Deleted

## 2014-08-25 ENCOUNTER — Other Ambulatory Visit: Payer: Self-pay | Admitting: *Deleted

## 2014-08-25 NOTE — Patient Outreach (Signed)
[facility]) Care Management  [facility] Social Work  08/25/2014  OAKLEY ORBAN 1929-11-20 [id number]     Current Medications:  Current Outpatient Prescriptions  Medication Sig Dispense Refill  . ALPRAZolam (XANAX) 0.5 MG tablet Take one tablet by mouth twice daily for anxiety 30 tablet 0  . aspirin 325 MG tablet Take 1 tablet (325 mg total) by mouth daily. 30 tablet 2  . bisacodyl (BISACODYL) 5 MG EC tablet Take 2 tablets (10 mg total) by mouth daily as needed for moderate constipation. If no effective bowel movement every 2- 3 days (Patient taking differently: Take 5-10 mg by mouth daily as needed for moderate constipation. If no effective bowel movement every 2- 3 days) 30 tablet 0  . CALCIUM CARBONATE PO Take 1 tablet by mouth daily.    . carboxymethylcellulose (REFRESH TEARS) 0.5 % SOLN 1 drop 2 (two) times daily as needed (dry eyes).     . cetirizine (ZYRTEC) 10 MG tablet Take 1 tablet (10 mg total) by mouth daily. 14 tablet 0  . CRANBERRY SOFT PO Take 1 capsule by mouth daily.    Marland Kitchen diltiazem (CARDIZEM CD) 180 MG 24 hr capsule Take 1 capsule (180 mg total) by mouth daily. 30 capsule 6  . DULoxetine (CYMBALTA) 30 MG capsule Take one tablet  once daily in the morning for depression 30 capsule 3  . DULoxetine (CYMBALTA) 60 MG capsule Take one tablet by mouth in the evening once daily for depression 30 capsule 3  . famotidine (PEPCID) 20 MG tablet Take 1 tablet (20 mg total) by mouth 2 (two) times daily as needed for heartburn. 60 tablet 1  . hydrochlorothiazide (MICROZIDE) 12.5 MG capsule Take 1 capsule (12.5 mg total) by mouth daily. 30 capsule 11  . HYDROcodone-acetaminophen (NORCO/VICODIN) 5-325 MG per tablet Take 1 tablet by mouth 2 (two) times daily as needed for moderate pain. 60 tablet 0  . hydroxychloroquine (PLAQUENIL) 200 MG tablet Take 200 mg by mouth daily.     Marland Kitchen lactose free nutrition (BOOST PLUS) LIQD Take 237 mLs by mouth 2 (two) times daily between meals.  0   . lansoprazole (PREVACID) 30 MG capsule TAKE ONE CAPSULE BY MOUTH EVERY DAY AT 12 NOON 30 capsule 5  . lubiprostone (AMITIZA) 24 MCG capsule Take 1 capsule (24 mcg total) by mouth 2 (two) times daily with a meal. 60 capsule 11  . metoprolol (LOPRESSOR) 50 MG tablet Take 1 tablet (50 mg total) by mouth 2 (two) times daily. 60 tablet 6  . NAMENDA XR 28 MG CP24 24 hr capsule TAKE ONE CAPSULE BY MOUTH EVERY DAY TO PRESERVE MEMORY 30 capsule 3  . nystatin (MYCOSTATIN) 100000 UNIT/ML suspension TAKE 1 TEASPOONFUL BY MOUTH 4 TIMES A DAY (Patient taking differently: TAKE 1 TEASPOONFUL BY MOUTH 4 TIMES A DAY as needed for mouth pain) 60 mL 5  . pilocarpine (SALAGEN) 5 MG tablet Take 5 mg by mouth 2 (two) times daily.    . pravastatin (PRAVACHOL) 20 MG tablet Take 1 tablet (20 mg total) by mouth daily at 6 PM. 30 tablet 2  . pregabalin (LYRICA) 75 MG capsule Take 1 capsule (75 mg total) by mouth 2 (two) times daily. 60 capsule 0  . sodium chloride (OCEAN) 0.65 % SOLN nasal spray Place 1 spray into the nose as needed for congestion. 30 mL 0  . triamcinolone ointment (KENALOG) 0.1 % Apply 1 application topically 2 (two) times daily as needed (rash).   1  No current facility-administered medications for this visit.    Functional Status:  In your present state of health, do you have any difficulty performing the following activities: 08/18/2014 07/26/2014  Hearing? N N  Vision? Y Y  Difficulty concentrating or making decisions? Tempie Donning  Walking or climbing stairs? Y Y  Dressing or bathing? Y Y  Doing errands, shopping? Tempie Donning  Preparing Food and eating ? Y -  Using the Toilet? Y -  In the past six months, have you accidently leaked urine? Y -  Managing your Medications? Y -  Managing your Finances? Y -  Housekeeping or managing your Housekeeping? Y -    Fall/Depression Screening:  PHQ 2/9 Scores 01/12/2014 12/08/2013 09/22/2013 08/07/2013 07/17/2013 04/20/2013  PHQ - 2 Score 0 0 '2 2 3 2  ' PHQ- 9 Score - - 6 - 8  -    Assessment:   CSW was able to make brief contact with patient today to obtain verbal consent to converse with patient's daughter, Zabria Liss.  However, patient refused to provide two HIPAA compliant identifiers to CSW.  After thorough review of patient's EMR (Electronic Medical Record) in EPIC, CSW noted that patient has been diagnosed with Dementia, currently suffering from a great deal of paranoia.  Patient appeared to be somewhat agitated and guarded at the time of CSW's call, questioning why CSW needed to speak with [name].  CSW explained the reason for the call, indicating that CSW had received a referral from [name], Telephonic Nurse Case Manager with [facility] [facility], to assist patient with completion of her Advanced Directives ([place] documents).  Patient reported, "You can talk to Tiernan all you want, but I won't be signing any documents".  Patient then proceeded to terminate the call. CSW was then able to contact [name] to perform the phone assessment on patient.  CSW introduced self, explained role and types of services provided through [employer].  [name] was already aware of the reason for the call, as CSW had left a HIPAA compliant message on voicemail for [name] on Monday, July 11th.  [name] was able to satisfy two HIPAA compliant identifiers for patient, providing CSW with patient's full name and date of birth.  [name] questioned how it would be possible for patient to complete her Living Will and [employer] documents when patient is experiencing memory deficits and has a diagnosis of Dementia.  CSW explained to [name] that a mini mental status exam will need to be performed with patient before CSW felt comfortable initiating the documents with patient. CSW further explained to [name] that [place] had just gotten  off the phone with patient, in which she adamantly refused to sign the documents.  [name] voiced understanding, indicating that she did not feel it would be appropriate to complete the documents with patient, given patient's current mental status. CSW explained to [name] that CSW did not feel comfortable completing the documents with patient, even if CSW were able to find someone that would be willing to witness and notarize the documents.  Not to mention, patient is currently taking psychotropic medications and pain medications, which may prevent patient from being able to understand the documents and complete them appropriately.  [name] indicated that she already has Medical illustrator over patient, just not medical.  Being that Mrs. Birdena Crandall is patient's next of kin, she  would be responsible for making medical decisions for patient, in the even that patient is unable to make these decisions for herself.  CSW provided [name] with the following information, encouraging her to contact Mr. Omelia Blackwater if additional legal questions arise in the future for which she requires advice and/or assistance: [employer] founded by Brynda Rim, [facility] [address], [place], [place], [place] CSW agreed to contact patient's Primary Care Physician, Dr. Hollace Kinnier to explain reasons why patient and [name] will not be completing Advanced Directives at this time.  CSW inquired as to how CSW could be of further assistance to [name] and/or patient.  [name] denied being able to identify any additional social work specific needs at present.  CSW provided [name] with CSW's contact information, encouraging [name] to contact CSW directly if social work needs arise in the future.  [name] voiced understanding and was agreeable to this plan.  [name]  was most appreciative of CSW's call and for counseling and supportive services offered to her while discussing her frustrations about her relationship with patient.   Plan:    CSW will no longer make arrangements to contact patient by phone or in person, as all goals of treatment have been met from social work standpoint and no additional social work needs have been identified at this time. CSW will notify [name], Telephonic Nurse Case Manager with [facility] [facility], of CSW's plans to close patient's case. CSW will submit a case closure request to [name], Care Management Assistant with [place] [facility], in the form of an [employer], ensuring that Mrs. Laurance Flatten is aware of Sherril Cong, Telephonic Nurse Case Manager with [facility] [place] [facility], continued involvement with patient's care. CSW will fax a correspondence letter to patient's Primary Care Physician, Dr. Hollace Kinnier to report social work involvement with patient.  [name], BSW, MSW, [facility] [place] [place], [place], [place] [address] [name] [phone]

## 2014-08-28 ENCOUNTER — Other Ambulatory Visit: Payer: Self-pay

## 2014-08-28 ENCOUNTER — Ambulatory Visit: Payer: Medicare Other | Admitting: *Deleted

## 2014-08-28 NOTE — Patient Outreach (Signed)
[facility]) Care Management  08/28/2014  Ariana [name] [birth year] [id number]  Telephone call to patients daughter/caregiver, [name] for EMMI stroke transition program follow up for patient. Unable to reach patients daughter.  Phone only rang.  Unable to leave voice message.    PLAN: RNCM will attempt outreach call to caregiver, Naw Lasala within 3 business days.   [name] RN,BSN,CCM [place] Coordinator [phone]

## 2014-08-31 ENCOUNTER — Encounter: Payer: Self-pay | Admitting: Internal Medicine

## 2014-08-31 ENCOUNTER — Telehealth: Payer: Self-pay | Admitting: Internal Medicine

## 2014-08-31 ENCOUNTER — Other Ambulatory Visit: Payer: Self-pay

## 2014-08-31 NOTE — Patient Outreach (Signed)
[facility]) Care Management  08/31/2014  Ariana [name] 06/02/1929 [id number]   SUBJECTIVE:  Telephone call to patients daughter/caregiver, Ariana [name]. Daughter verified HIPAA for patient.  Daughter states patient appears to be very tired since and she has noted patient mumbling.  Daughter states patient has a follow up appointment with her primary care provider on 09/04/14 and states doctor has been made aware of this at previous visit. Daughter states registered nurse with [facility] stopped services with patient on 08/28/14.  Daughter states patient continues to receive physical therapy services with [facility].  Daughter states patient continues to have caregivers provided daily from 8am to 5pm with agency,  [facility] due to patient having demenita. Daughter voices concern that agency changes cargivers periodically which causes patient to be upset.  Daughter states, "my mother told me she fell last week trying to get in the bathroom with her walker."  Daughter states patient is unable to get her walker into the bathroom which resulted in patient slipping.   Daughter states she is concerned that caregiver is not assisting her mother to the bathroom. RNCM advised patients daughter to discuss need to have patient assisted to bathroom with caregiver and also discuss with [employer] scientist (medical). RNCM advised daughter to discuss safety concerns with patients physical therapist who could assist with suggestions for safety.   Daughter verbalized agreement and understanding.   Daughter states patient has not gotten her compression stockings. Daughter states she has attempted on several occasions to take patient to get compression stockings but patient has refused.  Daughter voiced concern and frustration that patient is at time unwilling to cooperate with her care.  Daughter states she and her husband continue to assist patient with her medications for compliance. Daughter  states caregiver unable to get patient up sometimes until 12:30pm or 1:00pm to eat breakfast. Daughter states she spoke with [facility] care management social worker regarding advance directive for patient but patient unwilling to discuss advance directive.  Daughter states she has not received EMMI education material regarding recovering from stroke and Signs of stroke. Daughter agreed to next follow up phone call with this RNCM.   ASSESSMENT:  EMMI stroke transition follow up.  RNCM discussed fall prevention with daughter.  Suggested daughter having discussion with caregiver and physical therapy regarding safety concerns for patient.   PLAN: RNCM will follow up with patient within 2 weeks  RNCM will send patients daughter fall prevention article from EMMI to discuss with patient.

## 2014-09-01 ENCOUNTER — Encounter: Payer: Self-pay | Admitting: Internal Medicine

## 2014-09-01 ENCOUNTER — Encounter: Payer: Self-pay | Admitting: Gastroenterology

## 2014-09-01 NOTE — Telephone Encounter (Signed)
Close encounter 

## 2014-09-03 ENCOUNTER — Other Ambulatory Visit: Payer: Self-pay

## 2014-09-03 MED ORDER — ALPRAZOLAM 0.5 MG PO TABS: ORAL_TABLET | ORAL | Status: DC

## 2014-09-04 ENCOUNTER — Ambulatory Visit (INDEPENDENT_AMBULATORY_CARE_PROVIDER_SITE_OTHER): Payer: Medicare Other | Admitting: Internal Medicine

## 2014-09-04 ENCOUNTER — Encounter: Payer: Self-pay | Admitting: Internal Medicine

## 2014-09-04 VITALS — BP 118/66 | HR 65 | Temp 98.1°F | Resp 20 | Ht 65.0 in | Wt 148.4 lb

## 2014-09-04 DIAGNOSIS — R269 Unspecified abnormalities of gait and mobility: Secondary | ICD-10-CM | POA: Diagnosis not present

## 2014-09-04 DIAGNOSIS — F0391 Unspecified dementia with behavioral disturbance: Secondary | ICD-10-CM | POA: Diagnosis not present

## 2014-09-04 DIAGNOSIS — F341 Dysthymic disorder: Secondary | ICD-10-CM | POA: Diagnosis not present

## 2014-09-04 DIAGNOSIS — B370 Candidal stomatitis: Secondary | ICD-10-CM | POA: Diagnosis not present

## 2014-09-04 DIAGNOSIS — T402X5A Adverse effect of other opioids, initial encounter: Secondary | ICD-10-CM | POA: Diagnosis not present

## 2014-09-04 DIAGNOSIS — I638 Other cerebral infarction: Secondary | ICD-10-CM | POA: Diagnosis not present

## 2014-09-04 DIAGNOSIS — I6389 Other cerebral infarction: Secondary | ICD-10-CM

## 2014-09-04 DIAGNOSIS — E042 Nontoxic multinodular goiter: Secondary | ICD-10-CM | POA: Diagnosis not present

## 2014-09-04 DIAGNOSIS — N3941 Urge incontinence: Secondary | ICD-10-CM | POA: Diagnosis not present

## 2014-09-04 DIAGNOSIS — K5909 Other constipation: Secondary | ICD-10-CM | POA: Diagnosis not present

## 2014-09-04 DIAGNOSIS — R296 Repeated falls: Secondary | ICD-10-CM | POA: Diagnosis not present

## 2014-09-04 DIAGNOSIS — K5903 Drug induced constipation: Secondary | ICD-10-CM

## 2014-09-04 DIAGNOSIS — M25569 Pain in unspecified knee: Secondary | ICD-10-CM

## 2014-09-04 DIAGNOSIS — E43 Unspecified severe protein-calorie malnutrition: Secondary | ICD-10-CM

## 2014-09-04 DIAGNOSIS — F03918 Unspecified dementia, unspecified severity, with other behavioral disturbance: Secondary | ICD-10-CM

## 2014-09-04 MED ORDER — NYSTATIN 100000 UNIT/ML MT SUSP: OROMUCOSAL | Status: DC

## 2014-09-04 NOTE — Progress Notes (Signed)
Patient ID: Ariana [name], female   DOB: 09/09/1929, 81 y.o.   MRN: [id number]   Location:  [facility] / [place] [facility]  Code Status: full code Goals of Care: Advanced Directive information Does patient have an advance directive?: No   Chief Complaint  Patient presents with  . Medical Management of Chronic Issues    3 month follow-up  . Hospitalization Follow-up    was hospitalized in June for acute stroke    HPI: Patient is a 81 y.o. white female seen in the office today for med mgt and hospital f/u.    Went to hospital due to 3 falls (actually 4 now that daughter got more history) in 3 days.  Had subacute to chronic infarct in right parietal lobe. Records reviewed and discussed with pt and her daughter.  [place] discharged pt.   Has about 2 wks of PT.   CNA that helps with baths just began a week and a 1/2 ago.    Has own caregiver 9-5 who is new and very good with her.  Nobody is there at night.  Her daughter calls and reminds her of meds.  Has some difficulty remembering little things from the night before and does not remember that her daughter does call at least daily.  It has been recommended that she have 24 hr care but they cannot afford this.    [place] social worker was trying to get living will/hcpoa paperwork done, but pt is unable to make decisions at this point.  Needs guardianship established.  They don't want to deal with going to court about this.  She still refuses to go to a memory care unit which has been recommended by the hospital.  Assisted living is at least needed.      Has had one fall since she was discharged from the hospital.  Was walking from bedroom down the hall with her walker when it happened.  No injury.  It happened when the caregiver was at home.    Oral thrush--ran out of nystatin quickly due to small bottle.  Mouth still gets sore.    One big change since hospitalization is generalized fatigue.    Pt herself worries about  edema and weight gain.  Says left breast is about to come out of her bra due to swelling.  Has been eating well for several weeks.    She does not understand how to operate her thermostat and keeps putting the heat on in the summer b/c she's cold.    Review of Systems:  Review of Systems  Constitutional: Positive for malaise/fatigue. Negative for fever and chills.  HENT: Positive for hearing loss and sore throat. Negative for congestion.   Eyes: Negative for blurred vision.  Respiratory: Negative for cough and shortness of breath.   Cardiovascular: Positive for leg swelling. Negative for chest pain and palpitations.  Gastrointestinal: Positive for constipation. Negative for abdominal pain, diarrhea, blood in stool and melena.  Genitourinary: Positive for urgency and frequency. Negative for dysuria.       As soon as she stands up, she wets herself  Musculoskeletal: Positive for falls.  Skin: Negative for rash.  Neurological: Positive for weakness. Negative for dizziness and loss of consciousness.  Psychiatric/Behavioral: Positive for depression and memory loss. The patient is nervous/anxious.     Past Medical History  Diagnosis Date  . Sjogren's syndrome   . Dry eye syndrome   . Hypertension, benign   . Mitral valve prolapse   .  GERD (gastroesophageal reflux disease)   . Diverticulosis of colon   . Irritable bowel syndrome   . Urinary incontinence   . Low back pain syndrome   . Fibromyalgia   . Memory loss   . Anxiety and depression   . History of adverse drug reaction   . Peripheral neuropathy     "both feet and legs"  . Shortness of breath 07/18/11    "alot lately"  . Anginal pain   . History of recurrent UTIs   . H/O hiatal hernia   . Anxiety   . Dementia   . Depression   . Abnormality of gait   . Thyroid nodule   . Headache(784.0) 09/05/2012  . Adenomatous polyp of colon 2002    32mm    Past Surgical History  Procedure Laterality Date  . Vesicovaginal fistula  closure w/ tah    . Appendectomy    . Cholecystectomy  2000  . Mandible surgery    . Temporomandibular joint surgery  1986    Dr. Terence Lux  . Cataract extraction, bilateral    . Abdominal hysterectomy  1967  . Dental surgery      multiple tooth extractions  . Esophagogastroduodenoscopy (egd) with esophageal dilation N/A 08/23/2012    Procedure: ESOPHAGOGASTRODUODENOSCOPY (EGD) WITH ESOPHAGEAL DILATION;  Surgeon: Milus Banister, MD;  Location: [facility];  Service: Endoscopy;  Laterality: N/A;  . Colonoscopy w/ biopsies      multiple  . Nasal septum surgery  1980  . Transthoracic echocardiogram  2001    mild LVH, normal LV  . Nm myocar perf wall motion  2003    persantine - normal static and dynamic study w/apical thinning and presvered LV function, no ischemia  . Cardiac catheterization  02/17/2003    normal L main, LAD free of disease, Cfx free of disease, RCA free of disease (Dr. Loni Muse. [place])    Allergies  Allergen Reactions  . Banana Nausea And Vomiting  . Codeine Nausea Only    unless given with Phenergan  . Doxycycline     Unknown  . Klonopin [Clonazepam]     Causes hallucination   . Meperidine Hcl Nausea Only    unless given with Phenergan  . Naproxen   . Norflex [Orphenadrine Citrate] Nausea Only    Unless given with Phenergan  . Oxycodone-Acetaminophen Nausea Only    unless given with phenergan  . Penicillins     Unknown  . Phenothiazines     Unknown  . Propoxyphene Hcl Nausea Only    unless given with phenergan  . Stelazine     Unknown  . Sulfamethoxazole-Trimethoprim     Unknown  . Tolectin [Tolmetin Sodium]     Unknown  . Tramadol     Unknown  . Zoloft [Sertraline Hcl]     Caused pt to sleep a lot   Medications: Patient's Medications  New Prescriptions   No medications on file  Previous Medications   ALPRAZOLAM (XANAX) 0.5 MG TABLET    Take one tablet by mouth twice daily for anxiety   ASPIRIN 325 MG TABLET    Take 1 tablet (325 mg total) by  mouth daily.   BISACODYL (BISACODYL) 5 MG EC TABLET    Take 2 tablets (10 mg total) by mouth daily as needed for moderate constipation. If no effective bowel movement every 2- 3 days   CALCIUM CARBONATE PO    Take 1 tablet by mouth daily.   CARBOXYMETHYLCELLULOSE (REFRESH TEARS) 0.5 % SOLN  1 drop 2 (two) times daily as needed (dry eyes).    CETIRIZINE (ZYRTEC) 10 MG TABLET    Take 1 tablet (10 mg total) by mouth daily.   CRANBERRY SOFT PO    Take 1 capsule by mouth daily.   DILTIAZEM (CARDIZEM CD) 180 MG 24 HR CAPSULE    Take 1 capsule (180 mg total) by mouth daily.   DULOXETINE (CYMBALTA) 30 MG CAPSULE    Take one tablet  once daily in the morning for depression   DULOXETINE (CYMBALTA) 60 MG CAPSULE    Take one tablet by mouth in the evening once daily for depression   HYDROCHLOROTHIAZIDE (MICROZIDE) 12.5 MG CAPSULE    Take 1 capsule (12.5 mg total) by mouth daily.   HYDROCODONE-ACETAMINOPHEN (NORCO/VICODIN) 5-325 MG PER TABLET    Take 1 tablet by mouth 2 (two) times daily as needed for moderate pain.   HYDROXYCHLOROQUINE (PLAQUENIL) 200 MG TABLET    Take 200 mg by mouth daily.    LACTOSE FREE NUTRITION (BOOST PLUS) LIQD    Take 237 mLs by mouth 2 (two) times daily between meals.   LANSOPRAZOLE (PREVACID) 30 MG CAPSULE    TAKE ONE CAPSULE BY MOUTH EVERY DAY AT 12 NOON   LUBIPROSTONE (AMITIZA) 24 MCG CAPSULE    Take 1 capsule (24 mcg total) by mouth 2 (two) times daily with a meal.   METOPROLOL (LOPRESSOR) 50 MG TABLET    Take 1 tablet (50 mg total) by mouth 2 (two) times daily.   NAMENDA XR 28 MG CP24 24 HR CAPSULE    TAKE ONE CAPSULE BY MOUTH EVERY DAY TO PRESERVE MEMORY   PILOCARPINE (SALAGEN) 5 MG TABLET    Take 5 mg by mouth 2 (two) times daily.   PRAVASTATIN (PRAVACHOL) 20 MG TABLET    Take 1 tablet (20 mg total) by mouth daily at 6 PM.   PREGABALIN (LYRICA) 75 MG CAPSULE    Take 1 capsule (75 mg total) by mouth 2 (two) times daily.   SODIUM CHLORIDE (OCEAN) 0.65 % SOLN NASAL SPRAY     Place 1 spray into the nose as needed for congestion.   TRIAMCINOLONE OINTMENT (KENALOG) 0.1 %    Apply 1 application topically 2 (two) times daily as needed (rash).   Modified Medications   Modified Medication Previous Medication   NYSTATIN (MYCOSTATIN) 100000 UNIT/ML SUSPENSION nystatin (MYCOSTATIN) 100000 UNIT/ML suspension      TAKE 1 TEASPOONFUL BY MOUTH 4 TIMES A DAY as needed for mouth pain    TAKE 1 TEASPOONFUL BY MOUTH 4 TIMES A DAY  Discontinued Medications   FAMOTIDINE (PEPCID) 20 MG TABLET    Take 1 tablet (20 mg total) by mouth 2 (two) times daily as needed for heartburn.    Physical Exam: Filed Vitals:   09/04/14 1123  BP: 118/66  Pulse: 65  Temp: 98.1 F (36.7 C)  TempSrc: Oral  Resp: 20  Height: 5\' 5"  (1.651 m)  Weight: 148 lb 6.4 oz (67.314 kg)  SpO2: 94%   Physical Exam  Constitutional:  Frail white female, walking very slowly with walker, stooped posture  HENT:  Oral thrush present  Cardiovascular: Normal rate, regular rhythm, normal heart sounds and intact distal pulses.   Pulmonary/Chest: Effort normal and breath sounds normal. No respiratory distress.  Abdominal: Soft. Bowel sounds are normal. She exhibits no distension. There is no tenderness.  Musculoskeletal:  Slow, shuffling gait with walker  Neurological: She is alert.  Oriented to person only; repeats  herself frequently, asks same questions over and over again, gets agitated when her daughter tries to provide history--always says "she's not with me anyway"  Skin: Skin is warm and dry.    Labs reviewed: Basic Metabolic Panel:  Recent Labs  01/12/14 1452 06/12/14 1231 07/08/14 1513 07/26/14 1642  NA 140 142  --  142  K 4.6 4.4  --  4.0  CL 98 101  --  103  CO2 28 25  --   --   GLUCOSE 74 131*  --  99  BUN 20 18  --  16  CREATININE 0.72 0.68  --  0.70  CALCIUM 9.7 9.4  --   --   TSH  --   --  0.82  --    Liver Function Tests:  Recent Labs  06/12/14 1231  AST 26  ALT 22  ALKPHOS  92  BILITOT <0.2  PROT 6.9   No results for input(s): LIPASE, AMYLASE in the last 8760 hours. No results for input(s): AMMONIA in the last 8760 hours. CBC:  Recent Labs  06/12/14 1231 07/26/14 1635 07/26/14 1642  WBC 5.9 6.5  --   NEUTROABS 4.0 4.2  --   HGB  --  12.7 14.3  HCT 39.3 39.0 42.0  MCV  --  94.4  --   PLT  --  217  --    Lipid Panel:  Recent Labs  07/27/14 0332  CHOL 157  HDL 48  LDLCALC 97  TRIG 58  CHOLHDL 3.3   Lab Results  Component Value Date   HGBA1C 6.3* 07/27/2014    Procedures since last visit: 07/26/14:   -CT head:  1. No acute intracranial hemorrhage. 2. 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 -MRI HEAD: No acute intracranial process, specifically no acute ischemia. Subacute to chronic RIGHT parietal lobe infarct, MCA territory. Old RIGHT frontal/insula encephalomalacia (MCA territory). Mild to moderate white matter changes compatible with chronic small vessel ischemic disease.Suspected LEFT frontal developmental venous anomaly could be confirmed on contrast-enhanced sequences as clinically indicated. -MRA HEAD: Moderately motion degraded examination without large vessel occlusion. Moderate stenosis suspected of LEFT M1 origin. Diminutive RIGHT anterior cerebral artery likely on developmental basis. -Lumbar spine complete:  No acute osseous abnormality identified. L5-S1 disc degeneration. -Left knee 4 views complete:  1. Small suprapatellar effusion. 2. Negative for fracture. 3. Mild degenerative changes of osteoarthritis, most pronounced in the medial and patellofemoral compartments. -Cervical spine complete:  1. No acute radiographic abnormality of the cervical spine. 2. Severe multilevel degenerative disc disease and cervical spondylosis, as above.  Assessment/Plan 1. 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 Oral thrush - ongoing so renewed nystatin (MYCOSTATIN) 100000 UNIT/ML suspension; TAKE 1 TEASPOONFUL BY MOUTH 4 TIMES A DAY as needed for mouth pain  Dispense: 240 mL; Refill: 5  3. Therapeutic opioid induced constipation -given handout of a bowel report that caregiver could use to document bms and medications given -should definitely be getting amitiza daily and some additional stool softeners seem to be required for her  4. Multinodular thyroid -f/u tsh and free t4 today per hospital d/c summary - TSH - T4, Free  5. Senile dementia,  with behavioral disturbance -is progressing and is worse since hospitalization -cont namenda XR -I think she also has bipolar not just anxiety and depression and this makes matters much more difficult  6. Urge urinary incontinence -has opted to stop myrbetriq due to cost and little benefit  7. Protein-calorie malnutrition, severe - cont boost plus supplement and regular diet -I think her edema is from this - CBC with Differential/Platelet - Comprehensive metabolic panel  8. Falls frequently -encouraged to use walker at all times -cont pt, ot at home  9. ANXIETY DEPRESSION -remains poorly controlled--is on cymbalta and xanax and I have been unable to taper her off of xanax despite my best efforts even thought I think it is worsening her cognition and balance  10. Abnormality of gait -cont to use walker and do therapy  11. Posterior knee pain, unspecified laterality -this is bilateral -knee xray above with mild arthritis -seems to me that her hamstrings and piriformis are probably tight and would benefit from stretches to loosen those and strengthening of quads  30 minutes were spent with the patient and her daughter counseling about  medical conditions, her hospitalization and her safety with recommendations for placement in at least an AL setting, possibly memory care or 24 hr care.  Also again recommended she obtain guardianship b/c her mother is never going to agree to placement.  At least another 10 minutes were spent on exam and review of lab and study results with them.  Labs/tests ordered: Orders Placed This Encounter  Procedures  . CBC with Differential/Platelet  . Comprehensive metabolic panel  . TSH  . T4, Free    Next appt:  3 mos for med mgt  Tamieka Rancourt L. Briggs Edelen, D.O. [facility] [address], [place] [address] Cell Phone (Mon-Fri 8am-5pm):  [phone] On Call:  [phone] & follow prompts after 5pm & weekends Office Phone:  [phone] Office Fax:  [phone]

## 2014-09-04 NOTE — Patient Instructions (Signed)

## 2014-09-05 LAB — CBC WITH DIFFERENTIAL/PLATELET
Basophils Absolute: 0 10*3/uL (ref 0.0–0.2)
Basos: 1 %
EOS (ABSOLUTE): 0.2 10*3/uL (ref 0.0–0.4)
Eos: 3 %
Hematocrit: 36.2 % (ref 34.0–46.6)
Hemoglobin: 12 g/dL (ref 11.1–15.9)
Immature Grans (Abs): 0 10*3/uL (ref 0.0–0.1)
Immature Granulocytes: 0 %
Lymphocytes Absolute: 1.2 10*3/uL (ref 0.7–3.1)
Lymphs: 19 %
MCH: 30.3 pg (ref 26.6–33.0)
MCHC: 33.1 g/dL (ref 31.5–35.7)
MCV: 91 fL (ref 79–97)
Monocytes Absolute: 0.6 10*3/uL (ref 0.1–0.9)
Monocytes: 9 %
Neutrophils Absolute: 4.2 10*3/uL (ref 1.4–7.0)
Neutrophils: 68 %
Platelets: 212 10*3/uL (ref 150–379)
RBC: 3.96 x10E6/uL (ref 3.77–5.28)
RDW: 13.4 % (ref 12.3–15.4)
WBC: 6.2 10*3/uL (ref 3.4–10.8)

## 2014-09-05 LAB — COMPREHENSIVE METABOLIC PANEL
ALT: 17 IU/L (ref 0–32)
AST: 20 IU/L (ref 0–40)
Albumin/Globulin Ratio: 1.5 (ref 1.1–2.5)
Albumin: 4.1 g/dL (ref 3.5–4.7)
Alkaline Phosphatase: 93 IU/L (ref 39–117)
BUN/Creatinine Ratio: 31 — ABNORMAL HIGH (ref 11–26)
BUN: 24 mg/dL (ref 8–27)
Bilirubin Total: 0.4 mg/dL (ref 0.0–1.2)
CO2: 27 mmol/L (ref 18–29)
Calcium: 10 mg/dL (ref 8.7–10.3)
Chloride: 94 mmol/L — ABNORMAL LOW (ref 97–108)
Creatinine, Ser: 0.77 mg/dL (ref 0.57–1.00)
GFR calc Af Amer: 83 mL/min/{1.73_m2} (ref >59)
GFR calc non Af Amer: 72 mL/min/{1.73_m2} (ref >59)
Globulin, Total: 2.7 g/dL (ref 1.5–4.5)
Glucose: 148 mg/dL — ABNORMAL HIGH (ref 65–99)
Potassium: 4.3 mmol/L (ref 3.5–5.2)
Sodium: 136 mmol/L (ref 134–144)
Total Protein: 6.8 g/dL (ref 6.0–8.5)

## 2014-09-05 LAB — TSH: TSH: 1.23 u[IU]/mL (ref 0.450–4.500)

## 2014-09-05 LAB — T4, FREE: Free T4: 0.88 ng/dL (ref 0.82–1.77)

## 2014-09-09 ENCOUNTER — Other Ambulatory Visit: Payer: Self-pay | Admitting: Nurse Practitioner

## 2014-09-09 ENCOUNTER — Other Ambulatory Visit: Payer: Self-pay | Admitting: *Deleted

## 2014-09-09 NOTE — Patient Outreach (Signed)
[facility]) Care Management  09/09/2014  Ariana [name] 09/21/1929 [id number]  EMMI-stroke follow up call. Telephone call to patient's daughter who is taking all calls for patient (patient has dementia).  Daughter states patient has not had any new stroke symptoms that required hospital admission or emergency room visit since last contact with RM CM. States patient has not had any falls this past week.   Last primary care appointment completed on July 22 th. States no change in medications.   Voices that patient continues to receive home health services from physical therapist and certified nursing assistant. States home health services of RN have been completed. Daughter states that family is currently searching for another caregiver for patient because previous caregiver has not been abe to provide services. States personal care agency has recommended another caregiver which they are looking into.  Plan : follow care plan as noted.  Daughter agrees with follow up call next week from [place]. Sherrin Daisy, RN BSN [place] Management Coordinator [facility] Care Management  [phone]

## 2014-09-18 ENCOUNTER — Other Ambulatory Visit: Payer: Self-pay | Admitting: Nurse Practitioner

## 2014-09-18 ENCOUNTER — Other Ambulatory Visit: Payer: Self-pay | Admitting: Internal Medicine

## 2014-09-18 ENCOUNTER — Other Ambulatory Visit: Payer: Self-pay

## 2014-09-18 ENCOUNTER — Other Ambulatory Visit: Payer: Self-pay | Admitting: *Deleted

## 2014-09-18 MED ORDER — HYDROCODONE-ACETAMINOPHEN 5-325 MG PO TABS: 1.0000 | ORAL_TABLET | Freq: Two times a day (BID) | ORAL | Status: DC | PRN

## 2014-09-18 NOTE — Patient Outreach (Signed)
[facility]) Care Management  09/18/2014  ANTHONETTE LESAGE [birth year] [id number]  Telephone call to patients daughter, Kareli Hossain for EMMI stroke follow up.  Unable to reach.  HIPAA compliant voice message left with call back phone number.  PLAN: RNCM will attempt 2nd telephone outreach within 1 week.   [name] RN,BSN,CCM [place] Coordinator [phone]

## 2014-09-18 NOTE — Telephone Encounter (Signed)
Patient daughter requested and will pick up 

## 2014-09-23 ENCOUNTER — Other Ambulatory Visit: Payer: Medicare Other

## 2014-09-23 NOTE — Patient Outreach (Signed)
[place] [facility]) Care Management  09/23/2014  ALVENIA TREESE 25-Jul-1929 [id number]  Telephone call to patient/Daughter regarding EMMI stroke follow up.  Unable to reach.  HIPAA compliant voice message left with call back phone number  PLAN: RNCM will attempt 3rd phone call within 3 business days.  [name] RN,BSN,CCM [place] Coordinator [phone]

## 2014-09-24 ENCOUNTER — Encounter: Payer: Self-pay | Admitting: Neurology

## 2014-09-24 ENCOUNTER — Telehealth: Payer: Self-pay

## 2014-09-24 ENCOUNTER — Ambulatory Visit (INDEPENDENT_AMBULATORY_CARE_PROVIDER_SITE_OTHER): Payer: Medicare Other | Admitting: Neurology

## 2014-09-24 VITALS — BP 113/64 | HR 64 | Ht 63.0 in | Wt 149.4 lb

## 2014-09-24 DIAGNOSIS — G609 Hereditary and idiopathic neuropathy, unspecified: Secondary | ICD-10-CM | POA: Diagnosis not present

## 2014-09-24 DIAGNOSIS — F0390 Unspecified dementia without behavioral disturbance: Secondary | ICD-10-CM

## 2014-09-24 DIAGNOSIS — Z8679 Personal history of other diseases of the circulatory system: Secondary | ICD-10-CM

## 2014-09-24 DIAGNOSIS — F03A0 Unspecified dementia, mild, without behavioral disturbance, psychotic disturbance, mood disturbance, and anxiety: Secondary | ICD-10-CM

## 2014-09-24 DIAGNOSIS — R269 Unspecified abnormalities of gait and mobility: Secondary | ICD-10-CM | POA: Diagnosis not present

## 2014-09-24 HISTORY — DX: Personal history of other diseases of the circulatory system: Z86.79

## 2014-09-24 MED ORDER — DULOXETINE HCL 60 MG PO CPEP: ORAL_CAPSULE | ORAL | Status: DC

## 2014-09-24 MED ORDER — DULOXETINE HCL 30 MG PO CPEP: ORAL_CAPSULE | ORAL | Status: DC

## 2014-09-24 NOTE — Progress Notes (Signed)
Reason for visit: Stroke  Ariana [name] is an 81 y.o. female  History of present illness:  Ariana [name] is an 81 year old right-handed white female with a history of a peripheral neuropathy and an associated gait disorder. The patient walks with a walker. Around 07/26/2014, the patient was admitted to the hospital secondary to frequent falling. The patient was found to have a subacute right parietal stroke. The patient has a history of atrial fibrillation, but she has not been treated with and anticoagulant medication secondary to her fall risk, dementia, and some history of medical noncompliance. The patient lives alone at home, but she has caretakers coming in from 26 AM to 5 PM, and her daughter will manage her medications. The patient is getting ongoing physical therapy at this point, she has 3 weeks of therapy to go. The patient reports some back pain, and some discomfort in the knees when she tries to stand. She has urinary incontinence at night when she tries to get up and go to the bathroom. The patient uses adult diapers. She is on Cymbalta for her neuropathy pain. She in general will sleep fairly well. She has had swelling of the left leg, venous dopplers did not show a DVT, the patient was given a prescription for a compression stocking, but she has not yet gotten this. The patient returns to this office for an evaluation.  Past Medical History  Diagnosis Date  . Sjogren's syndrome   . Dry eye syndrome   . Hypertension, benign   . Mitral valve prolapse   . GERD (gastroesophageal reflux disease)   . Diverticulosis of colon   . Irritable bowel syndrome   . Urinary incontinence   . Low back pain syndrome   . Fibromyalgia   . Memory loss   . Anxiety and depression   . History of adverse drug reaction   . Peripheral neuropathy     "both feet and legs"  . Shortness of breath 07/18/11    "alot lately"  . Anginal pain   . History of recurrent UTIs   . H/O hiatal hernia   . Anxiety    . Dementia   . Depression   . Abnormality of gait   . Thyroid nodule   . Headache(784.0) 09/05/2012  . Adenomatous polyp of colon 2002    47mm  . History of cerebrovascular disease 09/24/2014    Past Surgical History  Procedure Laterality Date  . Vesicovaginal fistula closure w/ tah    . Appendectomy    . Cholecystectomy  2000  . Mandible surgery    . Temporomandibular joint surgery  1986    Dr. Terence Lux  . Cataract extraction, bilateral    . Abdominal hysterectomy  1967  . Dental surgery      multiple tooth extractions  . Esophagogastroduodenoscopy (egd) with esophageal dilation N/A 08/23/2012    Procedure: ESOPHAGOGASTRODUODENOSCOPY (EGD) WITH ESOPHAGEAL DILATION;  Surgeon: Milus Banister, MD;  Location: [facility];  Service: Endoscopy;  Laterality: N/A;  . Colonoscopy w/ biopsies      multiple  . Nasal septum surgery  1980  . Transthoracic echocardiogram  2001    mild LVH, normal LV  . Nm myocar perf wall motion  2003    persantine - normal static and dynamic study w/apical thinning and presvered LV function, no ischemia  . Cardiac catheterization  02/17/2003    normal L main, LAD free of disease, Cfx free of disease, RCA free of disease (Dr. Rockne Menghini)  Family History  Problem Relation Age of Onset  . Heart disease Father     heart attack  . Pneumonia Mother   . Heart attack Mother   . Hypertension Mother   . Hypertension Maternal Grandmother   . Colon cancer Sister   . Kidney disease Daughter   . Asthma Daughter   . Arthritis Daughter 88    osteo,  . Heart disease Son 22    stage 3 CHF(Diastolic /Systolic)  . Throat cancer Brother     Social history:  reports that she has quit smoking. She has never used smokeless tobacco. She reports that she does not drink alcohol or use illicit drugs.    Allergies  Allergen Reactions  . Banana Nausea And Vomiting  . Codeine Nausea Only    unless given with Phenergan  . Doxycycline     Unknown  . Klonopin  [Clonazepam]     Causes hallucination   . Meperidine Hcl Nausea Only    unless given with Phenergan  . Naproxen   . Norflex [Orphenadrine Citrate] Nausea Only    Unless given with Phenergan  . Oxycodone-Acetaminophen Nausea Only    unless given with phenergan  . Penicillins     Unknown  . Phenothiazines     Unknown  . Propoxyphene Hcl Nausea Only    unless given with phenergan  . Stelazine     Unknown  . Sulfamethoxazole-Trimethoprim     Unknown  . Tolectin [Tolmetin Sodium]     Unknown  . Tramadol     Unknown  . Zoloft [Sertraline Hcl]     Caused pt to sleep a lot    Medications:  Prior to Admission medications   Medication Sig Start Date End Date Taking? Authorizing Provider  ALPRAZolam Duanne Moron) 0.5 MG tablet Take one tablet by mouth twice daily for anxiety 09/03/14  Yes Tiffany L Reed, DO  aspirin 325 MG tablet Take 1 tablet (325 mg total) by mouth daily. 07/28/14  Yes Reyne Dumas, MD  bisacodyl (BISACODYL) 5 MG EC tablet Take 2 tablets (10 mg total) by mouth daily as needed for moderate constipation. If no effective bowel movement every 2- 3 days Patient taking differently: Take 5-10 mg by mouth daily as needed for moderate constipation. If no effective bowel movement every 2- 3 days 07/08/14  Yes Gatha Mayer, MD  CALCIUM CARBONATE PO Take 1 tablet by mouth daily.   Yes Historical Provider, MD  carboxymethylcellulose (REFRESH TEARS) 0.5 % SOLN 1 drop 2 (two) times daily as needed (dry eyes).    Yes Historical Provider, MD  cetirizine (ZYRTEC) 10 MG tablet Take 1 tablet (10 mg total) by mouth daily. 03/23/14  Yes Tiffany L Reed, DO  CRANBERRY SOFT PO Take 1 capsule by mouth daily.   Yes Historical Provider, MD  diltiazem (CARDIZEM CD) 180 MG 24 hr capsule Take 1 capsule (180 mg total) by mouth daily. 07/03/14  Yes Pixie Casino, MD  DULoxetine (CYMBALTA) 30 MG capsule Take one tablet  once daily in the morning for depression 06/12/14  Yes Tiffany L Reed, DO  DULoxetine  (CYMBALTA) 60 MG capsule Take one tablet by mouth in the evening once daily for depression 06/12/14  Yes Tiffany L Reed, DO  DULoxetine (CYMBALTA) 60 MG capsule TAKE ONE TABLET BY MOUTH ALONG WITH 30MG  ONCE DAILY FOR DEPRESSION 09/09/14  Yes Tiffany L Reed, DO  hydrochlorothiazide (MICROZIDE) 12.5 MG capsule Take 1 capsule (12.5 mg total) by mouth daily. 08/12/14  Yes  Pixie Casino, MD  HYDROcodone-acetaminophen (NORCO/VICODIN) 5-325 MG per tablet Take 1 tablet by mouth 2 (two) times daily as needed for moderate pain. 09/18/14  Yes Tiffany L Reed, DO  hydroxychloroquine (PLAQUENIL) 200 MG tablet Take 200 mg by mouth daily.    Yes Historical Provider, MD  lactose free nutrition (BOOST PLUS) LIQD Take 237 mLs by mouth 2 (two) times daily between meals. 07/22/13  Yes Maryann Mikhail, DO  lansoprazole (PREVACID) 30 MG capsule TAKE ONE CAPSULE BY MOUTH EVERY DAY AT 12 NOON 08/21/14  Yes Tiffany L Reed, DO  lubiprostone (AMITIZA) 24 MCG capsule Take 1 capsule (24 mcg total) by mouth 2 (two) times daily with a meal. 07/08/14  Yes Gatha Mayer, MD  LYRICA 75 MG capsule TAKE 1 CAPSULE TWICE A DAY 09/18/14  Yes Tiffany L Reed, DO  metoprolol (LOPRESSOR) 50 MG tablet Take 1 tablet (50 mg total) by mouth 2 (two) times daily. 07/03/14  Yes Pixie Casino, MD  NAMENDA XR 28 MG CP24 24 hr capsule TAKE ONE CAPSULE BY MOUTH EVERY DAY TO PRESERVE MEMORY 07/08/14  Yes Tiffany L Reed, DO  nystatin (MYCOSTATIN) 100000 UNIT/ML suspension TAKE 1 TEASPOONFUL BY MOUTH 4 TIMES A DAY as needed for mouth pain 09/04/14  Yes Tiffany L Reed, DO  pilocarpine (SALAGEN) 5 MG tablet Take 5 mg by mouth 2 (two) times daily.   Yes Historical Provider, MD  pravastatin (PRAVACHOL) 20 MG tablet Take 1 tablet (20 mg total) by mouth daily at 6 PM. 07/28/14  Yes Reyne Dumas, MD  sodium chloride (OCEAN) 0.65 % SOLN nasal spray Place 1 spray into the nose as needed for congestion. 08/23/12  Yes Modena Jansky, MD  triamcinolone ointment (KENALOG) 0.1 %  Apply 1 application topically 2 (two) times daily as needed (rash).  04/01/14  Yes Historical Provider, MD    ROS:  Out of a complete 14 system review of symptoms, the patient complains only of the following symptoms, and all other reviewed systems are negative.  Gait disorder Back pain Memory disorder  Blood pressure 113/64, pulse 64, height 5\' 3"  (1.6 m), weight 149 lb 6.4 oz (67.767 kg).  Physical Exam  General: The patient is alert and cooperative at the time of the examination.  Skin: No significant peripheral edema is noted on the right leg, 2+ edema below the knee on the left.   Neurologic Exam  Mental status: The patient is alert and oriented x 2 at the time of the examination (not oriented to date). The Mini-Mental Status Examination done today shows a total score 22/30. The patient is able to name 10 animals in 30 seconds.   Cranial nerves: Facial symmetry is present. Speech is normal, no aphasia or dysarthria is noted. Extraocular movements are full. Visual fields are full.  Motor: The patient has good strength in all 4 extremities.  Sensory examination: Soft touch sensation is symmetric on the face and arms, decreased sensation on the left leg relative to the right.  Coordination: The patient has good finger-nose-finger and heel-to-shin bilaterally.  Gait and station: The patient has a slow, wide-based gait. The patient uses a walker for ambulation. Romberg is negative, tandem gait was not attempted.  Reflexes: Deep tendon reflexes are symmetric, but are depressed.   MRI brain and MRA head 07/27/14:  IMPRESSION: MRI HEAD: No acute intracranial process, specifically no acute ischemia.  Subacute to chronic RIGHT parietal lobe infarct, MCA territory. Old RIGHT frontal/insula encephalomalacia (MCA territory).  Mild to  moderate white matter changes compatible with chronic small vessel ischemic disease.  Suspected LEFT frontal developmental venous anomaly  could be confirmed on contrast-enhanced sequences as clinically indicated.  MRA HEAD: Moderately motion degraded examination without large vessel occlusion.  Moderate stenosis suspected of LEFT M1 origin. Diminutive RIGHT anterior cerebral artery likely on developmental basis.  * MRI scan images were reviewed online. I agree with the written report.   Assessment/Plan:  1. Cerebrovascular disease, recent stroke  2. Peripheral neuropathy  3. Gait disorder  4. Memory disorder  The patient is living with some assistance. The patient is getting ongoing physical therapy. She has a history of atrial fibrillation, she is on aspirin therapy. If the safety with walking improves with therapy, she may be able to be converted to an anticoagulant medication. The patient will follow-up in 6 months. She was given prescriptions for the Cymbalta. She is also on Lyrica for the neuropathy pain.  Jill Alexanders MD 09/24/2014 7:31 PM  [facility] [address] [place], [place] [address]  Phone [phone] Fax [phone]

## 2014-09-24 NOTE — Telephone Encounter (Signed)
error 

## 2014-09-24 NOTE — Patient Instructions (Signed)

## 2014-09-25 ENCOUNTER — Other Ambulatory Visit: Payer: Self-pay

## 2014-09-25 DIAGNOSIS — Z599 Problem related to housing and economic circumstances, unspecified: Secondary | ICD-10-CM

## 2014-09-25 NOTE — Addendum Note (Signed)
Addended by: [name] E on: 09/25/2014 04:08 PM   Modules accepted: Orders

## 2014-09-25 NOTE — Patient Outreach (Signed)
[facility]) Care Management  09/25/2014  Ariana [name] 07-08-1929 [id number]   SUBJECTIVE:  Telephone call to patients daughter/caregiver, Ariana [name].  Daughter states there has been no changes in patients status.  Daughter states patient has not exhibited or reported any new symptoms.  Daughter denies patient having any falls in 2 weeks.  Daughter states  Patient now has a new caregiver with  [facility].  Daughter states patient has her home health physical therapy and nursing assistant extended for another 3 weeks. Daughter states patient had follow up visit with neurologist on yesterday 09/24/14.  Daughter denies any change/additions to medications and no change in plan of care.   Daughter voices her frustration regarding patients anger towards her.  Daughter states patient "throws her under the bus," when she is talking to people.  Daughter states patient tries to make people believe she is not calling, following up with her or caring for her.  Daughter states it is taking a toll on her and she is very frustrated.  Daughter states she and her husband are the caregivers for patient and this has placed a strain on the family.  Daughter verbalizes agreement to receive community resource information from Education officer, museum regarding dementia support group and possible long term care information.  ASSESSMENT:  EMMI stroke transition program. Daughter/caregiver voices frustration with care for patient and strain it has placed on her family.  RNCM offered social worker to provide community resources.  Daughter/ caregiver agreed.  Daughter verbalized her lack of knowledge regarding dementia and request additional information.  PLAN:  RNCM will send caregiver/daughter information regarding dementia and falls safety. RNCM will refer caregiver back to social worker for community resources for dementia patients.  RNCM will follow up with daughter/caregiver in 1 week.  [name]  RN,BSN,CCM [place] Coordinator [phone]

## 2014-09-27 DIAGNOSIS — R296 Repeated falls: Secondary | ICD-10-CM | POA: Diagnosis not present

## 2014-09-27 DIAGNOSIS — M6281 Muscle weakness (generalized): Secondary | ICD-10-CM | POA: Diagnosis not present

## 2014-09-27 DIAGNOSIS — M1990 Unspecified osteoarthritis, unspecified site: Secondary | ICD-10-CM | POA: Diagnosis not present

## 2014-09-27 DIAGNOSIS — I10 Essential (primary) hypertension: Secondary | ICD-10-CM | POA: Diagnosis not present

## 2014-09-27 DIAGNOSIS — I4891 Unspecified atrial fibrillation: Secondary | ICD-10-CM | POA: Diagnosis not present

## 2014-09-27 DIAGNOSIS — M797 Fibromyalgia: Secondary | ICD-10-CM | POA: Diagnosis not present

## 2014-09-28 NOTE — Patient Outreach (Signed)
[facility]) Care Management  09/28/2014  SHAWNAY BRAMEL [birth year] [id number]   Request from [name], RN to assign SW, assigned [employer], LCSW.  Thanks, [name]. [place], [place] Assistant Phone: [phone] Fax: [phone]

## 2014-10-02 ENCOUNTER — Other Ambulatory Visit: Payer: Medicare Other | Admitting: *Deleted

## 2014-10-02 ENCOUNTER — Other Ambulatory Visit: Payer: Self-pay | Admitting: *Deleted

## 2014-10-02 MED ORDER — ALPRAZOLAM 0.5 MG PO TABS: ORAL_TABLET | ORAL | Status: DC

## 2014-10-02 NOTE — Telephone Encounter (Signed)
Almeta, daughter called and requested refill

## 2014-10-06 ENCOUNTER — Other Ambulatory Visit: Payer: Medicare Other | Admitting: *Deleted

## 2014-10-06 NOTE — Patient Outreach (Signed)
[place]) Care Management  10/06/2014  BEATRIC FULOP April 20, 1929 [id number]   CSW made a second attempt to try and contact patient and patient's daughter, Zoeann Mol today to perform phone assessment, as well as assess and assist with social work needs and services, without success.  A HIPAA compliant message was left.  CSW is awaiting a return call.  In the meantime, CSW will mail a packet of information to patient's home for [name]'s, patient's primary caregiver, review.  The packet of information will include a list of long-term care nursing facilities that offer the wander-guard system, in addition to information about local support groups for family members and loved ones of Dementia/Alzheimer's patient's.    [name], BSW, MSW, [place] [facility] [place], [place], [place] [address] [name] [phone]

## 2014-10-07 ENCOUNTER — Encounter: Payer: Self-pay | Admitting: *Deleted

## 2014-10-07 NOTE — Patient Outreach (Addendum)
[facility]) Care Management  10/07/2014  Ariana [name] 02-Nov-1929 [id number]   CSW made an initial attempt to try and contact patient today to perform phone assessment, as well as assess and assist with social needs and services, without success.  A HIPAA complaint message was left.  CSW is awaiting a return call.  [name], BSW, MSW, [place] [facility] [place], [place], [place] [address] [name] [phone]

## 2014-10-12 ENCOUNTER — Other Ambulatory Visit: Payer: Medicare Other | Admitting: *Deleted

## 2014-10-12 ENCOUNTER — Encounter: Payer: Self-pay | Admitting: *Deleted

## 2014-10-12 NOTE — Patient Outreach (Signed)
[facility]) Care Management  10/12/2014  Ariana [name] 1930-02-05 [id number]    CSW made a third and final attempt to try and contact patient today to perform phone assessment, without success.  A HIPAA compliant message was left on voicemail and CSW is currently awaiting a return call.  CSW will mail an outreach letter to patient's home, encouraging patient to contact CSW directly if she is interested in receiving social work services and resources through [employer].  If CSW does not receive a return call from patient within the next 10 business days, CSW will proceed with case closure.  CSW will mail the following list of resources and information to patient's home for her review:  "Caring for a Person with Alzheimer's/Dementia Disease" "Do You Qualify for Financial Assistance for [place] and Resources Assisting Persons' with Dementia Local Support Groups for Family Members and [place] for Individuals with Alzheimer's and Dementia Long-Term Care Nursing Facilities in [place] ([place] for [employer]) Sports administrator Resources of [place], [place], MSW, [place] Management [address] [place], [place] [address] [name] [phone]

## 2014-10-14 ENCOUNTER — Ambulatory Visit: Payer: Medicare Other | Admitting: Internal Medicine

## 2014-10-15 ENCOUNTER — Other Ambulatory Visit: Payer: Self-pay

## 2014-10-15 ENCOUNTER — Other Ambulatory Visit: Payer: Self-pay | Admitting: Internal Medicine

## 2014-10-15 NOTE — Patient Outreach (Addendum)
[facility]) Care Management  10/15/2014  Ariana [name] Jan 21, 1930 [id number]  Telephone call to Ariana [name], with Adult enrichment center to request caregiver support group information.  Information provided by Ariana [name].  Telephone call to patients daughter/caregiver, Ariana [name].  Unable to reach. HIPAA compliant voice message left with call back phone number.   PLAN: RNCM will mail patients daughter/caregiver Ariana [name] caregiver support group brochure. Will attempt call back to daughter within 1 week.   [name] RN,BSN,CCM [place] Coordinator [phone]

## 2014-10-16 ENCOUNTER — Other Ambulatory Visit: Payer: Self-pay

## 2014-10-16 ENCOUNTER — Encounter: Payer: Self-pay | Admitting: *Deleted

## 2014-10-16 MED ORDER — HYDROCODONE-ACETAMINOPHEN 5-325 MG PO TABS: ORAL_TABLET | ORAL | Status: DC

## 2014-10-16 NOTE — Patient Outreach (Signed)
[facility]) Care Management  10/16/2014  TKEYA STENCIL 01-Jan-1930 [id number]   Notification from [name], RN to close case due to patient goals met with [place] [facility].  Thanks, [name]. [place], [place] Assistant Phone: [phone] Fax: [phone]

## 2014-10-16 NOTE — Telephone Encounter (Signed)
Daughter [place] called for refill on Hydrocodone last refill 09/18/14, printed

## 2014-10-16 NOTE — Patient Outreach (Addendum)
[facility]) Care Management  10/16/2014  Ariana [name] 12-Sep-1929 [id number]  SUBJECTIVE:  Telephone call to patients daughter, Ariana [name] regarding Civil engineer, contracting for caregiver support.  RNCM discussed with daughter that information has been mailed to her by social worker, Ariana [name] and by this [facility] for caregiver resources and support group. Daughter states patient has not had any falls since last [facility] outreach.    RNCM informed daughter that EMMI stroke program would be complete at this time.  Daughter verbalized understanding and voiced appreciation for the follow up calls and information.   ASSESSMENT: EMMI stroke program closure.  Patient/caregiver has met goals.  Caregiver aware that community information has been sent to her by this RNCM and Education officer, museum. RNCM advised caregiver to review community resource material and falls material that was previously sent.   PLAN: RNCM will refer to Ariana [name] to close due to [facility] stroke program goals being met. RNCM will notify social worker, Ariana [name] that patient is aware community resources has been sent and case to be closed.  RNCM will notify Ariana [name] of goals being met.  Survey to be sent to caregiver.   Ariana Plowman RN,BSN,CCM [name] Coordinator [phone]

## 2014-10-22 NOTE — Patient Outreach (Signed)
Encounter in error.  [name], BSW, MSW, LCSW  Licensed Education officer, environmental Health System  Mailing [place] [address], [place], [place] [address] Physical Address[address], [place], [place] [address] Toll Free Main # [phone] Fax # [phone] Cell # [phone]  Fax # [phone]  [name]

## 2014-10-22 NOTE — Patient Outreach (Signed)
[facility]) Care Management  10/22/2014  Ariana [name] 06-19-1929 [id number]   This was CSW's second attempt at trying to make contact with patient and patient's daughter, Ariana [name], without success.  A HIPAA compliant message was left on voicemail.  [name], BSW, MSW, LCSW  Licensed Education officer, environmental Health System  Mailing [place] [address], [place], [place] [address] Physical [address] [place], [place], [place] [address] Toll Free Main # [phone] Fax # [phone] Cell # [phone]  Fax # [phone]  [name]

## 2014-10-23 ENCOUNTER — Other Ambulatory Visit: Payer: Self-pay | Admitting: *Deleted

## 2014-10-23 MED ORDER — PRAVASTATIN SODIUM 20 MG PO TABS: 20.0000 mg | ORAL_TABLET | Freq: Every day | ORAL | Status: DC

## 2014-10-23 NOTE — Telephone Encounter (Signed)
Patient daughter, Paytyn requested and faxed.

## 2014-10-26 ENCOUNTER — Other Ambulatory Visit: Payer: Self-pay | Admitting: *Deleted

## 2014-10-26 ENCOUNTER — Encounter: Payer: Self-pay | Admitting: *Deleted

## 2014-10-26 ENCOUNTER — Other Ambulatory Visit: Payer: Medicare Other | Admitting: *Deleted

## 2014-10-26 NOTE — Patient Outreach (Signed)
[facility]) Care Management  10/26/2014  Ariana [name] Sep 02, 1929 [id number]   CSW will perform a case closure on patient due to inability to establish initial phone contact with patient, despite 4 phone attempts made and outreach letter mailed to patient's home.   CSW will notify [name], Telephonic Nurse Case Manager with [place] Management, of CSW's plans to close patient's case. CSW will fax a correspondence letter to patient's Primary Care Physician, Dr. Hollace Kinnier to ensure that [name] is aware of CSW's involvement with patient. CSW will submit a case closure request to [name], Care Management Assistant with [place] Management.  [name], BSW, MSW, LCSW  Licensed Education officer, environmental Health System  Mailing [place] [address] [place], [place] [address] Physical Address[address], [place], [place] [address] Toll Free Main # [phone] Fax # [phone] Cell # [phone]  Fax # [phone]  [name]

## 2014-10-31 ENCOUNTER — Other Ambulatory Visit: Payer: Self-pay | Admitting: Internal Medicine

## 2014-11-05 ENCOUNTER — Encounter: Payer: Self-pay | Admitting: Internal Medicine

## 2014-11-05 ENCOUNTER — Ambulatory Visit (INDEPENDENT_AMBULATORY_CARE_PROVIDER_SITE_OTHER): Payer: Medicare Other | Admitting: Internal Medicine

## 2014-11-05 VITALS — BP 128/78 | HR 53 | Ht 63.0 in | Wt 155.0 lb

## 2014-11-05 DIAGNOSIS — I4581 Long QT syndrome: Secondary | ICD-10-CM

## 2014-11-05 DIAGNOSIS — I10 Essential (primary) hypertension: Secondary | ICD-10-CM | POA: Diagnosis not present

## 2014-11-05 DIAGNOSIS — R9431 Abnormal electrocardiogram [ECG] [EKG]: Secondary | ICD-10-CM

## 2014-11-05 DIAGNOSIS — R002 Palpitations: Secondary | ICD-10-CM | POA: Diagnosis not present

## 2014-11-05 DIAGNOSIS — F03A0 Unspecified dementia, mild, without behavioral disturbance, psychotic disturbance, mood disturbance, and anxiety: Secondary | ICD-10-CM

## 2014-11-05 DIAGNOSIS — F0390 Unspecified dementia without behavioral disturbance: Secondary | ICD-10-CM

## 2014-11-05 DIAGNOSIS — I4891 Unspecified atrial fibrillation: Secondary | ICD-10-CM

## 2014-11-05 MED ORDER — HYDROCHLOROTHIAZIDE 25 MG PO TABS: 25.0000 mg | ORAL_TABLET | Freq: Every day | ORAL | Status: DC

## 2014-11-05 NOTE — Progress Notes (Signed)
OFFICE NOTE  Chief Complaint:  Follow-up  Primary Care Physician: Hollace Kinnier, DO  HPI:  Ariana [name] an 81 year old female with history of some confusion in the past, hypertension and Sjogren syndrome. I had added lisinopril to her regimen which has helped control her blood pressure much better. She does report good control over her blood pressure over the past year; however, has had recent worsening problems such as cough, new productive sputum, problems with urinary tract infections. Weight is up back to 126 pounds after dip to 116 pounds but this is about where she is normally with a good appetite. Blood pressure is noted to be mildly elevated today; however, she is in some discomfort and has been short of breath with productive cough. Denies any chest pain. She does have pain in her back that shoots to her right thigh when sitting or leaning down. Today she has a number of other complaints. Her main issue is palpitations which she reports she had an episode of fast heart rate for almost one month but it has somewhat resolved. She still gets some recurrent palpitations.  I saw Ariana [name] back in the office today. She was recently seen by [name] and according to her notes his had marked progression of her dementia. She's now having significant behavioral disturbance as well as wandering. This is cause significant stress for her daughter and family. Compliance with medications is poor. There have been falls at home. Today in the office she is noted to be in new onset atrial fibrillation with rapid ventricular response. Heart rate is in the 120s. I had a long discussion with her daughter and her to the extent that she could understand about atrial fibrillation and her increased risk for stroke. Her CHADSVASC score is elevated at 4. She's also complaining of some abdominal discomfort but no significant chest pain. Blood pressure was low today at 90/70.  Unfortunately Ariana [name] returns today  for follow-up. She was recently hospitalized after an episode of transient global amnesia. She was ultimately found to have stroke with 2 clear punctate lesions on imaging. Is felt to be cardioembolic and may be related to her atrial fibrillation. She continues to have falls and in fact the fall was her initial presentation. As we discussed at length before she is at very high risk for complications on increased anticoagulation such as warfarin or a direct oral anticoagulant. Despite her CHADSVASC score now of 6, it is felt that anticoagulation is too risky. She has had an increase in her aspirin from 81 mg to 325 mg.  Ariana [name] returns today for follow-up. She continues to have significant behavioral issues related to dementia and possibly bipolar type symptoms. From a cardiac standpoint, her main complaints continue to be lower extremity swelling. She's not been fitted with compression stockings as it's been too difficult for her daughter to get her to [place]. She is requesting treatment for the edema although Mrs. Tugman reports that she does have significant incontinence. She continues to live at home and get some home assistance, but that is winding down after her recent stroke. She had another fall last week and I still feel is not a good candidate for anticoagulation beyond aspirin.  PMHx:  Past Medical History  Diagnosis Date  . Sjogren's syndrome   . Dry eye syndrome   . Hypertension, benign   . Mitral valve prolapse   . GERD (gastroesophageal reflux disease)   . Diverticulosis of colon   .  Irritable bowel syndrome   . Urinary incontinence   . Low back pain syndrome   . Fibromyalgia   . Memory loss   . Anxiety and depression   . History of adverse drug reaction   . Peripheral neuropathy     "both feet and legs"  . Shortness of breath 07/18/11    "alot lately"  . Anginal pain   . History of recurrent UTIs   . H/O hiatal hernia   . Anxiety   . Dementia   . Depression   .  Abnormality of gait   . Thyroid nodule   . Headache(784.0) 09/05/2012  . Adenomatous polyp of colon 2002    54mm  . History of cerebrovascular disease 09/24/2014    Past Surgical History  Procedure Laterality Date  . Vesicovaginal fistula closure w/ tah    . Appendectomy    . Cholecystectomy  2000  . Mandible surgery    . Temporomandibular joint surgery  1986    Dr. Terence Lux  . Cataract extraction, bilateral    . Abdominal hysterectomy  1967  . Dental surgery      multiple tooth extractions  . Esophagogastroduodenoscopy (egd) with esophageal dilation N/A 08/23/2012    Procedure: ESOPHAGOGASTRODUODENOSCOPY (EGD) WITH ESOPHAGEAL DILATION;  Surgeon: Milus Banister, MD;  Location: [facility];  Service: Endoscopy;  Laterality: N/A;  . Colonoscopy w/ biopsies      multiple  . Nasal septum surgery  1980  . Transthoracic echocardiogram  2001    mild LVH, normal LV  . Nm myocar perf wall motion  2003    persantine - normal static and dynamic study w/apical thinning and presvered LV function, no ischemia  . Cardiac catheterization  02/17/2003    normal L main, LAD free of disease, Cfx free of disease, RCA free of disease (Dr. Rockne Menghini)    FAMHx:  Family History  Problem Relation Age of Onset  . Heart disease Father     heart attack  . Pneumonia Mother   . Heart attack Mother   . Hypertension Mother   . Hypertension Maternal Grandmother   . Colon cancer Sister   . Kidney disease Daughter   . Asthma Daughter   . Arthritis Daughter 77    osteo,  . Heart disease Son 72    stage 3 CHF(Diastolic /Systolic)  . Throat cancer Brother     SOCHx:   reports that she has quit smoking. She has never used smokeless tobacco. She reports that she does not drink alcohol or use illicit drugs.  ALLERGIES:  Allergies  Allergen Reactions  . Banana Nausea And Vomiting  . Codeine Nausea Only    unless given with Phenergan  . Doxycycline     Unknown  . Klonopin [Clonazepam]     Causes  hallucination   . Meperidine Hcl Nausea Only    unless given with Phenergan  . Naproxen   . Norflex [Orphenadrine Citrate] Nausea Only    Unless given with Phenergan  . Oxycodone-Acetaminophen Nausea Only    unless given with phenergan  . Penicillins     Unknown  . Phenothiazines     Unknown  . Propoxyphene Hcl Nausea Only    unless given with phenergan  . Stelazine     Unknown  . Sulfamethoxazole-Trimethoprim     Unknown  . Tolectin [Tolmetin Sodium]     Unknown  . Tramadol     Unknown  . Zoloft [Sertraline Hcl]     Caused pt  to sleep a lot    ROS: A comprehensive review of systems was negative except for: Constitutional: positive for fatigue Cardiovascular: positive for palpitations Gastrointestinal: positive for abdominal pain  HOME MEDS: Current Outpatient Prescriptions  Medication Sig Dispense Refill  . ALPRAZolam (XANAX) 0.5 MG tablet TAKE 1 TABLET BY MOUTH TWICE A DAY FOR ANXIETY 60 tablet 0  . aspirin 325 MG tablet Take 1 tablet (325 mg total) by mouth daily. 30 tablet 2  . bisacodyl (BISACODYL) 5 MG EC tablet Take 2 tablets (10 mg total) by mouth daily as needed for moderate constipation. If no effective bowel movement every 2- 3 days (Patient taking differently: Take 5-10 mg by mouth daily as needed for moderate constipation. If no effective bowel movement every 2- 3 days) 30 tablet 0  . CALCIUM CARBONATE PO Take 1 tablet by mouth daily.    . carboxymethylcellulose (REFRESH TEARS) 0.5 % SOLN 1 drop 2 (two) times daily as needed (dry eyes).     . cetirizine (ZYRTEC) 10 MG tablet Take 1 tablet (10 mg total) by mouth daily. 14 tablet 0  . CRANBERRY SOFT PO Take 1 capsule by mouth daily.    Marland Kitchen diltiazem (CARDIZEM CD) 180 MG 24 hr capsule Take 1 capsule (180 mg total) by mouth daily. 30 capsule 6  . DULoxetine (CYMBALTA) 30 MG capsule Take one tablet  once daily in the morning for depression 30 capsule 5  . DULoxetine (CYMBALTA) 60 MG capsule Take one tablet by mouth  in the evening once daily for depression 30 capsule 5  . HYDROcodone-acetaminophen (NORCO/VICODIN) 5-325 MG per tablet Take one tablet twice daily for pain 60 tablet 0  . hydroxychloroquine (PLAQUENIL) 200 MG tablet Take 200 mg by mouth daily.     Marland Kitchen lactose free nutrition (BOOST PLUS) LIQD Take 237 mLs by mouth 2 (two) times daily between meals.  0  . lansoprazole (PREVACID) 30 MG capsule TAKE ONE CAPSULE BY MOUTH EVERY DAY AT 12 NOON 30 capsule 5  . lubiprostone (AMITIZA) 24 MCG capsule Take 1 capsule (24 mcg total) by mouth 2 (two) times daily with a meal. 60 capsule 11  . LYRICA 75 MG capsule TAKE ONE CAPSULE BY MOUTH TWICE A DAY 60 capsule 0  . metoprolol (LOPRESSOR) 50 MG tablet Take 1 tablet (50 mg total) by mouth 2 (two) times daily. 60 tablet 6  . NAMENDA XR 28 MG CP24 24 hr capsule TAKE ONE CAPSULE BY MOUTH EVERY DAY TO PRESERVE MEMORY 30 capsule 3  . nystatin (MYCOSTATIN) 100000 UNIT/ML suspension TAKE 1 TEASPOONFUL BY MOUTH 4 TIMES A DAY as needed for mouth pain 240 mL 5  . pilocarpine (SALAGEN) 5 MG tablet Take 5 mg by mouth 2 (two) times daily.    . pravastatin (PRAVACHOL) 20 MG tablet Take 1 tablet (20 mg total) by mouth daily at 6 PM. 30 tablet 3  . sodium chloride (OCEAN) 0.65 % SOLN nasal spray Place 1 spray into the nose as needed for congestion. 30 mL 0  . triamcinolone ointment (KENALOG) 0.1 % Apply 1 application topically 2 (two) times daily as needed (rash).   1  . hydrochlorothiazide (HYDRODIURIL) 25 MG tablet Take 1 tablet (25 mg total) by mouth daily. 90 tablet 3   No current facility-administered medications for this visit.    LABS/IMAGING: No results found for this or any previous visit (from the past 48 hour(s)). No results found.  VITALS: BP 128/78 mmHg  Pulse 53  Ht 5\' 3"  (1.6  m)  Wt 155 lb (70.308 kg)  BMI 27.46 kg/m2  EXAM: General appearance: alert and no distress Lungs: clear to auscultation bilaterally Heart: regular rate and rhythm, S1, S2 normal,  no murmur, click, rub or gallop Extremities: edema 2+ and varicose veins noted Psych: Variable mood, at times angers easily  EKG: Sinus bradycardia at 53  ASSESSMENT: 1. New-onset paroxysmal atrial fibrillation with rapid ventricular response - CHADSVASC 6 (not on warfarin due to falls) 2. Recent stroke 3. Hypertension-now hypotensive 4. Chest wall pain 5. Palpitations 6. Sjogren syndrome 7. Progressive dementia with behavioral disturbance 8. Falls and medication noncompliance 9. Asymmetric left lower extremity edema  PLAN: 1.   Ms. Henkin continues to have problems with dementia and behavioral problems which is very challenging for her daughter. Although it is difficult, she may ultimately benefit from being in a memory care facility where she can get her medicines more regularly. There is still question of medicine compliance, which would make using warfarin or a direct oral anticoagulant very risky, especially with continued falls. She has significant leg swelling and has not yet been fitted with stockings. We'll go ahead and measure her today provide a prescription so she can get those in town from a medical supply company. I will also go ahead and increase her thiazide 25 mg daily hopefully this will help with her swelling. Unfortunately, I think this may certainly worsen her incontinence.  Plan follow-up in 6 months.   Pixie Casino, MD, [facility] Attending Cardiologist [place] 11/05/2014, 3:20 PM

## 2014-11-05 NOTE — Patient Instructions (Signed)
Your physician has recommended you make the following change in your medication: INCREASE HCTZ TO 25MG  DAILY.  A NEW RX HAS BEEN SENT TO YOUR PHARMACY.  Your physician recommends that you schedule a follow-up appointment in: 6 MONTHS

## 2014-11-11 ENCOUNTER — Telehealth: Payer: Self-pay

## 2014-11-11 NOTE — Telephone Encounter (Signed)
Message left on triage voicemail: patient had a fall this am, walker tripped on a cord. Patient with small scrape on back, no other visible injuries or concerns related to fall. The Physical Therapist was calling as a FYI.

## 2014-11-11 NOTE — Telephone Encounter (Signed)
Physical Therapist Verdis Frederickson called requesting verbal order to extend PT 2 x weekly x 2 weeks. Verbal order was giving per standing protocol at [facility] will be routed to PCP as a FYI only

## 2014-11-11 NOTE — Telephone Encounter (Signed)
Noted.  Continue home health therapy for her falls and unsteady gait.

## 2014-11-12 ENCOUNTER — Other Ambulatory Visit: Payer: Self-pay | Admitting: Internal Medicine

## 2014-11-13 ENCOUNTER — Other Ambulatory Visit: Payer: Self-pay | Admitting: *Deleted

## 2014-11-13 MED ORDER — MEMANTINE HCL ER 28 MG PO CP24: ORAL_CAPSULE | ORAL | Status: DC

## 2014-11-13 NOTE — Telephone Encounter (Signed)
[facility]-Patient requested 90 day supply

## 2014-11-20 ENCOUNTER — Other Ambulatory Visit: Payer: Self-pay | Admitting: *Deleted

## 2014-11-20 MED ORDER — HYDROCODONE-ACETAMINOPHEN 5-325 MG PO TABS: ORAL_TABLET | ORAL | Status: DC

## 2014-11-20 NOTE — Telephone Encounter (Signed)
Patient daughter, Jamika called and requested and will pick up

## 2014-11-23 ENCOUNTER — Telehealth: Payer: Self-pay | Admitting: *Deleted

## 2014-11-23 NOTE — Telephone Encounter (Signed)
Maria with [facility] called and requested verbal orders to recert patient for PT 2 times/week for 5 weeks. Verbal order given.

## 2014-11-26 DIAGNOSIS — R296 Repeated falls: Secondary | ICD-10-CM | POA: Diagnosis not present

## 2014-11-26 DIAGNOSIS — M797 Fibromyalgia: Secondary | ICD-10-CM | POA: Diagnosis not present

## 2014-11-26 DIAGNOSIS — M6281 Muscle weakness (generalized): Secondary | ICD-10-CM | POA: Diagnosis not present

## 2014-11-26 DIAGNOSIS — F411 Generalized anxiety disorder: Secondary | ICD-10-CM | POA: Diagnosis not present

## 2014-11-26 DIAGNOSIS — R2681 Unsteadiness on feet: Secondary | ICD-10-CM | POA: Diagnosis not present

## 2014-11-26 DIAGNOSIS — F0390 Unspecified dementia without behavioral disturbance: Secondary | ICD-10-CM | POA: Diagnosis not present

## 2014-12-02 ENCOUNTER — Telehealth: Payer: Self-pay | Admitting: *Deleted

## 2014-12-02 ENCOUNTER — Ambulatory Visit: Payer: Self-pay | Admitting: Internal Medicine

## 2014-12-02 NOTE — Telephone Encounter (Signed)
Patient's daughter called regarding her mother, they found her on the floor after no response to the door and by phone. The patient's physical therapist Verdis Frederickson) called stating that she was complaing of back pain greater than 5, unsteady gait. After speaking with the daughter, she was scheduled to come into see Dr. Nyoka Cowden regarding this fall and her unsteady gait.

## 2014-12-02 NOTE — Telephone Encounter (Signed)
Ok thanks.  She needs placement, but her daughter is well aware of that.  There are financial issues interfering with this and pt is very resistant.  Gait has been very unsteady for several years.

## 2014-12-03 ENCOUNTER — Inpatient Hospital Stay (HOSPITAL_COMMUNITY)
Admission: EM | Admit: 2014-12-03 | Discharge: 2014-12-08 | DRG: 689 | Disposition: A | Discharge status: Home Health | Payer: Medicare Other | Attending: Internal Medicine | Admitting: Internal Medicine

## 2014-12-03 ENCOUNTER — Encounter (HOSPITAL_COMMUNITY): Payer: Self-pay | Admitting: Neurology

## 2014-12-03 ENCOUNTER — Telehealth: Payer: Self-pay | Admitting: *Deleted

## 2014-12-03 ENCOUNTER — Emergency Department (HOSPITAL_COMMUNITY): Payer: Medicare Other

## 2014-12-03 DIAGNOSIS — G629 Polyneuropathy, unspecified: Secondary | ICD-10-CM | POA: Diagnosis present

## 2014-12-03 DIAGNOSIS — Z8673 Personal history of transient ischemic attack (TIA), and cerebral infarction without residual deficits: Secondary | ICD-10-CM

## 2014-12-03 DIAGNOSIS — Z87891 Personal history of nicotine dependence: Secondary | ICD-10-CM

## 2014-12-03 DIAGNOSIS — T502X5A Adverse effect of carbonic-anhydrase inhibitors, benzothiadiazides and other diuretics, initial encounter: Secondary | ICD-10-CM | POA: Diagnosis not present

## 2014-12-03 DIAGNOSIS — G9340 Encephalopathy, unspecified: Secondary | ICD-10-CM | POA: Diagnosis present

## 2014-12-03 DIAGNOSIS — M3500 Sicca syndrome, unspecified: Secondary | ICD-10-CM | POA: Diagnosis present

## 2014-12-03 DIAGNOSIS — I341 Nonrheumatic mitral (valve) prolapse: Secondary | ICD-10-CM | POA: Diagnosis present

## 2014-12-03 DIAGNOSIS — Z7982 Long term (current) use of aspirin: Secondary | ICD-10-CM

## 2014-12-03 DIAGNOSIS — E876 Hypokalemia: Secondary | ICD-10-CM | POA: Diagnosis not present

## 2014-12-03 DIAGNOSIS — E860 Dehydration: Secondary | ICD-10-CM | POA: Diagnosis present

## 2014-12-03 DIAGNOSIS — Z23 Encounter for immunization: Secondary | ICD-10-CM

## 2014-12-03 DIAGNOSIS — E871 Hypo-osmolality and hyponatremia: Secondary | ICD-10-CM | POA: Diagnosis not present

## 2014-12-03 DIAGNOSIS — R531 Weakness: Secondary | ICD-10-CM | POA: Diagnosis not present

## 2014-12-03 DIAGNOSIS — N179 Acute kidney failure, unspecified: Secondary | ICD-10-CM | POA: Diagnosis present

## 2014-12-03 DIAGNOSIS — F419 Anxiety disorder, unspecified: Secondary | ICD-10-CM | POA: Diagnosis present

## 2014-12-03 DIAGNOSIS — Z9181 History of falling: Secondary | ICD-10-CM

## 2014-12-03 DIAGNOSIS — R109 Unspecified abdominal pain: Secondary | ICD-10-CM

## 2014-12-03 DIAGNOSIS — M797 Fibromyalgia: Secondary | ICD-10-CM | POA: Diagnosis present

## 2014-12-03 DIAGNOSIS — I10 Essential (primary) hypertension: Secondary | ICD-10-CM | POA: Diagnosis present

## 2014-12-03 DIAGNOSIS — F0390 Unspecified dementia without behavioral disturbance: Secondary | ICD-10-CM | POA: Diagnosis present

## 2014-12-03 DIAGNOSIS — K219 Gastro-esophageal reflux disease without esophagitis: Secondary | ICD-10-CM | POA: Diagnosis present

## 2014-12-03 DIAGNOSIS — F329 Major depressive disorder, single episode, unspecified: Secondary | ICD-10-CM | POA: Diagnosis present

## 2014-12-03 DIAGNOSIS — N390 Urinary tract infection, site not specified: Secondary | ICD-10-CM | POA: Diagnosis present

## 2014-12-03 DIAGNOSIS — B9620 Unspecified Escherichia coli [E. coli] as the cause of diseases classified elsewhere: Secondary | ICD-10-CM | POA: Diagnosis present

## 2014-12-03 DIAGNOSIS — I4891 Unspecified atrial fibrillation: Secondary | ICD-10-CM

## 2014-12-03 DIAGNOSIS — I480 Paroxysmal atrial fibrillation: Secondary | ICD-10-CM | POA: Diagnosis present

## 2014-12-03 DIAGNOSIS — W19XXXA Unspecified fall, initial encounter: Secondary | ICD-10-CM | POA: Insufficient documentation

## 2014-12-03 LAB — COMPREHENSIVE METABOLIC PANEL
ALBUMIN: 3.8 g/dL (ref 3.5–5.0)
ALT: 21 U/L (ref 14–54)
ANION GAP: 8 (ref 5–15)
AST: 34 U/L (ref 15–41)
Alkaline Phosphatase: 90 U/L (ref 38–126)
BUN: 29 mg/dL — ABNORMAL HIGH (ref 6–20)
CO2: 31 mmol/L (ref 22–32)
Calcium: 9.7 mg/dL (ref 8.9–10.3)
Chloride: 96 mmol/L — ABNORMAL LOW (ref 101–111)
Creatinine, Ser: 1.12 mg/dL — ABNORMAL HIGH (ref 0.44–1.00)
GFR calc non Af Amer: 44 mL/min — ABNORMAL LOW (ref >60)
GFR, EST AFRICAN AMERICAN: 51 mL/min — AB (ref >60)
GLUCOSE: 118 mg/dL — AB (ref 65–99)
POTASSIUM: 3.9 mmol/L (ref 3.5–5.1)
SODIUM: 135 mmol/L (ref 135–145)
TOTAL PROTEIN: 7.1 g/dL (ref 6.5–8.1)
Total Bilirubin: 0.4 mg/dL (ref 0.3–1.2)

## 2014-12-03 LAB — CBC
HEMATOCRIT: 36.9 % (ref 36.0–46.0)
HEMOGLOBIN: 12.1 g/dL (ref 12.0–15.0)
MCH: 29.9 pg (ref 26.0–34.0)
MCHC: 32.8 g/dL (ref 30.0–36.0)
MCV: 91.1 fL (ref 78.0–100.0)
Platelets: 208 10*3/uL (ref 150–400)
RBC: 4.05 MIL/uL (ref 3.87–5.11)
RDW: 13.3 % (ref 11.5–15.5)
WBC: 5.8 10*3/uL (ref 4.0–10.5)

## 2014-12-03 LAB — URINE MICROSCOPIC-ADD ON

## 2014-12-03 LAB — URINALYSIS, ROUTINE W REFLEX MICROSCOPIC
BILIRUBIN URINE: NEGATIVE
Glucose, UA: NEGATIVE mg/dL
HGB URINE DIPSTICK: NEGATIVE
KETONES UR: NEGATIVE mg/dL
NITRITE: POSITIVE — AB
PROTEIN: NEGATIVE mg/dL
SPECIFIC GRAVITY, URINE: 1.009 (ref 1.005–1.030)
UROBILINOGEN UA: 0.2 mg/dL (ref 0.0–1.0)
pH: 5.5 (ref 5.0–8.0)

## 2014-12-03 LAB — CK: Total CK: 287 U/L — ABNORMAL HIGH (ref 38–234)

## 2014-12-03 LAB — I-STAT CG4 LACTIC ACID, ED
Lactic Acid, Venous: 1.54 mmol/L (ref 0.5–2.0)
Lactic Acid, Venous: 1.23 mmol/L (ref 0.5–2.0)

## 2014-12-03 LAB — I-STAT TROPONIN, ED: Troponin i, poc: 0.01 ng/mL (ref 0.00–0.08)

## 2014-12-03 MED ORDER — SODIUM CHLORIDE 0.9 % IV BOLUS (SEPSIS)
500.0000 mL | Freq: Once | INTRAVENOUS | Status: AC
  Administered 2014-12-03: 500 mL via INTRAVENOUS

## 2014-12-03 MED ORDER — DEXTROSE 5 % IV SOLN
1.0000 g | Freq: Once | INTRAVENOUS | Status: AC
  Administered 2014-12-03: 1 g via INTRAVENOUS
  Filled 2014-12-03: qty 10

## 2014-12-03 NOTE — Progress Notes (Signed)
Admitting doctor no new orders given yet, will continue to monitor

## 2014-12-03 NOTE — ED Notes (Addendum)
Pt is here with a home health aid, yesterday morning she was found lying in the floor where she had laid on the floor all night. Pt is c/o lower back pain, her left hand was swollen this morning but has gone down. Pt is alert but aid reports she has dementia. Reports concerned she could have had another stroke that made her fall yesterday.

## 2014-12-03 NOTE — Telephone Encounter (Signed)
Patient had an appointment scheduled for yesterday due to a fall and had to cancel due to car problems. Ariana [name], Daughter stated that she thinks her mother had another stroke and that is what made her fall. Patient is complaining about hand hurting and swelling and sleeping all the time. I instructed her to take her mother to ER to be evaluated and she agreed.

## 2014-12-03 NOTE — ED Provider Notes (Signed)
CSN: [id number]     Arrival date & time 12/03/14  1522 History   First MD Initiated Contact with Patient 12/03/14 1643     Chief Complaint  Patient presents with  . Fall  . Back Pain    Patient is a 81 y.o. female presenting with general illness. The history is provided by the patient, the EMS personnel, the nursing home, medical records and a relative.  Illness Location:  Generalized, left hand Quality:  Generalized weakness and fatigue x2 days after being found lying down on ground after unknown down time, left hand pain with use  Severity:  Moderate Onset quality:  Gradual Duration:  2 days Timing:  Constant Progression:  Worsening Chronicity:  New Context:  Pt with dementia, increase in falls recently, found down by son on tuesday morning but unable to be seen then, has been with home health RN who reports pt sleeping all day with poor PO intake Associated symptoms: fatigue   Associated symptoms: no abdominal pain, no chest pain, no fever, no rash, no rhinorrhea, no shortness of breath and no vomiting   Associated symptoms comment:  Dysuria    Past Medical History  Diagnosis Date  . Sjogren's syndrome ([place])   . Dry eye syndrome   . Hypertension, benign   . Mitral valve prolapse   . GERD (gastroesophageal reflux disease)   . Diverticulosis of colon   . Irritable bowel syndrome   . Urinary incontinence   . Low back pain syndrome   . Fibromyalgia   . Memory loss   . Anxiety and depression   . History of adverse drug reaction   . Peripheral neuropathy ([facility])     "both feet and legs"  . Shortness of breath 07/18/11    "alot lately"  . Anginal pain ([place])   . History of recurrent UTIs   . H/O hiatal hernia   . Anxiety   . Dementia   . Depression   . Abnormality of gait   . Thyroid nodule   . Headache(784.0) 09/05/2012  . Adenomatous polyp of colon 2002    16mm  . History of cerebrovascular disease 09/24/2014   Past Surgical History  Procedure Laterality Date  .  Vesicovaginal fistula closure w/ tah    . Appendectomy    . Cholecystectomy  2000  . Mandible surgery    . Temporomandibular joint surgery  1986    Dr. Terence Lux  . Cataract extraction, bilateral    . Abdominal hysterectomy  1967  . Dental surgery      multiple tooth extractions  . Esophagogastroduodenoscopy (egd) with esophageal dilation N/A 08/23/2012    Procedure: ESOPHAGOGASTRODUODENOSCOPY (EGD) WITH ESOPHAGEAL DILATION;  Surgeon: Milus Banister, MD;  Location: [facility];  Service: Endoscopy;  Laterality: N/A;  . Colonoscopy w/ biopsies      multiple  . Nasal septum surgery  1980  . Transthoracic echocardiogram  2001    mild LVH, normal LV  . Nm myocar perf wall motion  2003    persantine - normal static and dynamic study w/apical thinning and presvered LV function, no ischemia  . Cardiac catheterization  02/17/2003    normal L main, LAD free of disease, Cfx free of disease, RCA free of disease (Dr. Rockne Menghini)   Family History  Problem Relation Age of Onset  . Heart disease Father     heart attack  . Pneumonia Mother   . Heart attack Mother   . Hypertension Mother   . Hypertension  Maternal Grandmother   . Colon cancer Sister   . Kidney disease Daughter   . Asthma Daughter   . Arthritis Daughter 59    osteo,  . Heart disease Son 14    stage 3 CHF(Diastolic /Systolic)  . Throat cancer Brother    Social History  Substance Use Topics  . Smoking status: Former Research scientist (life sciences)  . Smokeless tobacco: Never Used     Comment: Quit at age 40   . Alcohol Use: No   OB History    No data available     Review of Systems  Constitutional: Positive for fatigue. Negative for fever.  HENT: Negative for rhinorrhea.   Eyes: Negative for visual disturbance.  Respiratory: Negative for shortness of breath.   Cardiovascular: Negative for chest pain.  Gastrointestinal: Negative for vomiting and abdominal pain.  Genitourinary: Positive for dysuria.  Musculoskeletal: Positive for  arthralgias.  Skin: Negative for rash.  Allergic/Immunologic: Negative for immunocompromised state.  Neurological: Negative for syncope.  Psychiatric/Behavioral: Positive for confusion.      Allergies  Banana; Codeine; Klonopin; Meperidine hcl; Norflex; Oxycodone-acetaminophen; Propoxyphene hcl; Zoloft; Doxycycline; Naproxen; Penicillins; Phenothiazines; Stelazine; Sulfamethoxazole-trimethoprim; Tolectin; and Tramadol  Home Medications   Prior to Admission medications   Medication Sig Start Date End Date Taking? Authorizing Provider  ALPRAZolam Duanne Moron) 0.5 MG tablet Take one tablet by mouth twice daily for anxiety. Patient taking differently: Take 0.5 mg by mouth 2 (two) times daily. Take one tablet by mouth twice daily for anxiety. 12/04/14  Yes Tiffany L Reed, DO  aspirin 325 MG tablet Take 1 tablet (325 mg total) by mouth daily. 07/28/14  Yes Reyne Dumas, MD  bisacodyl (BISACODYL) 5 MG EC tablet Take 2 tablets (10 mg total) by mouth daily as needed for moderate constipation. If no effective bowel movement every 2- 3 days Patient taking differently: Take 5-10 mg by mouth daily as needed for moderate constipation. If no effective bowel movement every 2- 3 days 07/08/14  Yes Gatha Mayer, MD  CALCIUM CARBONATE PO Take 1 tablet by mouth daily.   Yes Historical Provider, MD  carboxymethylcellulose (REFRESH TEARS) 0.5 % SOLN Place 1 drop into both eyes 2 (two) times daily as needed (dry eyes).    Yes Historical Provider, MD  cetirizine (ZYRTEC) 10 MG tablet Take 1 tablet (10 mg total) by mouth daily. 03/23/14  Yes Tiffany L Reed, DO  CRANBERRY SOFT PO Take 1 capsule by mouth daily.   Yes Historical Provider, MD  diltiazem (CARDIZEM CD) 180 MG 24 hr capsule Take 180 mg by mouth daily.   Yes Historical Provider, MD  DULoxetine (CYMBALTA) 30 MG capsule Take one tablet  once daily in the morning for depression Patient taking differently: Take 30 mg by mouth every morning. Take one tablet  once  daily in the morning for depression 09/24/14  Yes Kathrynn Ducking, MD  DULoxetine (CYMBALTA) 60 MG capsule Take one tablet by mouth in the evening once daily for depression Patient taking differently: Take 60 mg by mouth every evening. Take one tablet by mouth in the evening once daily for depression 09/24/14  Yes Kathrynn Ducking, MD  hydrochlorothiazide (HYDRODIURIL) 25 MG tablet Take 1 tablet (25 mg total) by mouth daily. 11/05/14  Yes Pixie Casino, MD  hydroxychloroquine (PLAQUENIL) 200 MG tablet Take 200 mg by mouth daily.    Yes Historical Provider, MD  lactose free nutrition (BOOST PLUS) LIQD Take 237 mLs by mouth 2 (two) times daily between meals. 07/22/13  Yes Maryann Mikhail, DO  lansoprazole (PREVACID) 30 MG capsule TAKE ONE CAPSULE BY MOUTH EVERY DAY AT 12 NOON 08/21/14  Yes Tiffany L Reed, DO  lubiprostone (AMITIZA) 24 MCG capsule Take 1 capsule (24 mcg total) by mouth 2 (two) times daily with a meal. 07/08/14  Yes Gatha Mayer, MD  LYRICA 75 MG capsule TAKE ONE CAPSULE BY MOUTH TWICE A DAY 11/13/14  Yes Tiffany L Reed, DO  memantine (NAMENDA XR) 28 MG CP24 24 hr capsule Take one capsule by mouth once daily to preserve memory Patient taking differently: Take 28 mg by mouth daily. Take one capsule by mouth once daily to preserve memory 11/13/14  Yes Tiffany L Reed, DO  metoprolol (LOPRESSOR) 50 MG tablet Take 1 tablet (50 mg total) by mouth 2 (two) times daily. 07/03/14  Yes Pixie Casino, MD  nystatin (MYCOSTATIN) 100000 UNIT/ML suspension TAKE 1 TEASPOONFUL BY MOUTH 4 TIMES A DAY as needed for mouth pain Patient taking differently: Take 5 mLs by mouth 4 (four) times daily as needed (for mouth pain).  09/04/14  Yes Tiffany L Reed, DO  pilocarpine (SALAGEN) 5 MG tablet Take 5 mg by mouth 2 (two) times daily.   Yes Historical Provider, MD  pravastatin (PRAVACHOL) 20 MG tablet Take 1 tablet (20 mg total) by mouth daily at 6 PM. 10/23/14  Yes Tiffany L Reed, DO  sodium chloride (OCEAN) 0.65 % SOLN  nasal spray Place 1 spray into the nose as needed for congestion. 08/23/12  Yes Modena Jansky, MD  triamcinolone ointment (KENALOG) 0.1 % Apply 1 application topically 2 (two) times daily as needed (rash).  04/01/14  Yes Historical Provider, MD  ALPRAZolam Duanne Moron) 0.5 MG tablet Take 1 tablet (0.5 mg total) by mouth 2 (two) times daily as needed for anxiety. 12/07/14   Reyne Dumas, MD  cephALEXin (KEFLEX) 500 MG capsule Take 1 capsule (500 mg total) by mouth every 8 (eight) hours. 12/07/14 12/12/14  Reyne Dumas, MD  HYDROcodone-acetaminophen (NORCO/VICODIN) 5-325 MG tablet Take 1 tablet by mouth every 6 (six) hours as needed for moderate pain. Take one tablet twice daily for pain 12/07/14   Reyne Dumas, MD   BP 109/56 mmHg  Pulse 55  Temp(Src) 97.5 F (36.4 C) (Oral)  Resp 18  Ht 5\' 3"  (1.6 m)  Wt 154 lb 15.7 oz (70.3 kg)  BMI 27.46 kg/m2  SpO2 100% Physical Exam  Constitutional: She appears well-developed and well-nourished. No distress.  HENT:  Head: Normocephalic and atraumatic.  Eyes: Right eye exhibits no discharge. Left eye exhibits no discharge.  Neck: No tracheal deviation present.  Cardiovascular: Regular rhythm.  Bradycardia present.   Pulmonary/Chest: Effort normal and breath sounds normal. No respiratory distress.  Abdominal: Soft. She exhibits no distension. There is tenderness (suprapubic ).  Musculoskeletal: Normal range of motion. She exhibits no edema.  Hands symmetric, no appreciable edema or point ttp, 2+ radial pulse, intact sensation to light touch in all left digits, intact grip symmetric to R hand  Neurological: She is alert. She has normal strength. She displays normal reflexes. No cranial nerve deficit or sensory deficit. GCS eye subscore is 4. GCS verbal subscore is 4. GCS motor subscore is 6.  Skin: Skin is warm and dry.  Psychiatric:  Intermittently agitated at family members, having difficulty recalling recent events   Nursing note and vitals  reviewed.   ED Course  Procedures (including critical care time) Labs Review Labs Reviewed  COMPREHENSIVE METABOLIC PANEL - Abnormal; Notable  for the following:    Chloride 96 (*)    Glucose, Bld 118 (*)    BUN 29 (*)    Creatinine, Ser 1.12 (*)    GFR calc non Af Amer 44 (*)    GFR calc Af Amer 51 (*)    All other components within normal limits  CK - Abnormal; Notable for the following:    Total CK 287 (*)    All other components within normal limits  URINALYSIS, ROUTINE W REFLEX MICROSCOPIC (NOT AT [facility]) - Abnormal; Notable for the following:    APPearance CLOUDY (*)    Nitrite POSITIVE (*)    Leukocytes, UA MODERATE (*)    All other components within normal limits  URINE MICROSCOPIC-ADD ON - Abnormal; Notable for the following:    Bacteria, UA MANY (*)    Casts HYALINE CASTS (*)    All other components within normal limits  COMPREHENSIVE METABOLIC PANEL - Abnormal; Notable for the following:    Glucose, Bld 124 (*)    All other components within normal limits  CBC - Abnormal; Notable for the following:    HCT 35.4 (*)    All other components within normal limits  CBC - Abnormal; Notable for the following:    RBC 3.82 (*)    Hemoglobin 11.6 (*)    HCT 34.7 (*)    All other components within normal limits  BASIC METABOLIC PANEL - Abnormal; Notable for the following:    Potassium 3.4 (*)    Glucose, Bld 113 (*)    All other components within normal limits  BASIC METABOLIC PANEL - Abnormal; Notable for the following:    Chloride 100 (*)    Glucose, Bld 137 (*)    All other components within normal limits  BASIC METABOLIC PANEL - Abnormal; Notable for the following:    Chloride 97 (*)    Glucose, Bld 121 (*)    All other components within normal limits  URINE CULTURE  CBC  TSH  AMMONIA  LIPASE, BLOOD  HEPATIC FUNCTION PANEL  I-STAT TROPOININ, ED  I-STAT CG4 LACTIC ACID, ED  I-STAT CG4 LACTIC ACID, ED    Imaging Review No results found. I have personally  reviewed and evaluated these images and lab results as part of my medical decision-making.   EKG Interpretation   Date/Time:  Thursday December 03 2014 18:12:02 EDT Ventricular Rate:  50 PR Interval:  278 QRS Duration: 111 QT Interval:  535 QTC Calculation: 488 R Axis:   -59 Text Interpretation:  Sinus or ectopic atrial rhythm Prolonged PR interval  Incomplete RBBB and LAFB Borderline prolonged QT interval ED PHYSICIAN  INTERPRETATION AVAILABLE IN [facility] Confirmed by TEST, Record  ([id number]) on 12/04/2014 7:23:17 AM      MDM   Final diagnoses:  UTI (lower urinary tract infection)  AKI (acute kidney injury) ([place])    81 yo F with hx sjogrens, UTIs, HTN, MVP, peripheral neuropathy, fibromyalgia, dementia, depression and anxiety presenting with diffuse weakness and fatigue, poor PO intake, dysuria, increase falls, with report that pt was found down at home 2 days ago after unknown incident overnight between Monday and Tuesday. Some pain reported in all MCP joints of left hand, no appreciable asymmetries or acute injuries on exam comparing this to R side. AF, VSS though borderline bradycardic upon arrival. Somewhat confused and unable to recall details of the past several days. No appreciable new limb weakness or sensory deficits on exam to suggest new CVA. CTs  of head neg, XR of hand neg for acute injuries- likely pain 2/2 arthritis. Found to have AKI and UTI. Urine cx sent and started on rocephin. Admitted for further management.   Case discussed with Dr. Billy Fischer, who oversaw management of this patient.     Ivin Booty, MD 12/07/14 1404  Gareth Morgan, MD 12/08/14 1353

## 2014-12-04 ENCOUNTER — Inpatient Hospital Stay (HOSPITAL_COMMUNITY): Payer: Medicare Other

## 2014-12-04 ENCOUNTER — Encounter (HOSPITAL_COMMUNITY): Payer: Self-pay | Admitting: Internal Medicine

## 2014-12-04 ENCOUNTER — Other Ambulatory Visit: Payer: Self-pay | Admitting: *Deleted

## 2014-12-04 DIAGNOSIS — W19XXXA Unspecified fall, initial encounter: Secondary | ICD-10-CM | POA: Insufficient documentation

## 2014-12-04 DIAGNOSIS — N390 Urinary tract infection, site not specified: Principal | ICD-10-CM

## 2014-12-04 DIAGNOSIS — I10 Essential (primary) hypertension: Secondary | ICD-10-CM

## 2014-12-04 DIAGNOSIS — G9340 Encephalopathy, unspecified: Secondary | ICD-10-CM

## 2014-12-04 DIAGNOSIS — N179 Acute kidney failure, unspecified: Secondary | ICD-10-CM

## 2014-12-04 HISTORY — DX: Encephalopathy, unspecified: G93.40

## 2014-12-04 LAB — COMPREHENSIVE METABOLIC PANEL
ALBUMIN: 3.6 g/dL (ref 3.5–5.0)
ALT: 23 U/L (ref 14–54)
ANION GAP: 10 (ref 5–15)
AST: 31 U/L (ref 15–41)
Alkaline Phosphatase: 86 U/L (ref 38–126)
BILIRUBIN TOTAL: 0.8 mg/dL (ref 0.3–1.2)
BUN: 19 mg/dL (ref 6–20)
CO2: 29 mmol/L (ref 22–32)
Calcium: 9.5 mg/dL (ref 8.9–10.3)
Chloride: 101 mmol/L (ref 101–111)
Creatinine, Ser: 0.75 mg/dL (ref 0.44–1.00)
GFR calc Af Amer: >60 mL/min (ref >60)
GFR calc non Af Amer: >60 mL/min (ref >60)
GLUCOSE: 124 mg/dL — AB (ref 65–99)
POTASSIUM: 3.5 mmol/L (ref 3.5–5.1)
SODIUM: 140 mmol/L (ref 135–145)
TOTAL PROTEIN: 6.8 g/dL (ref 6.5–8.1)

## 2014-12-04 LAB — CBC
HEMATOCRIT: 35.4 % — AB (ref 36.0–46.0)
HEMOGLOBIN: 12.1 g/dL (ref 12.0–15.0)
MCH: 30.7 pg (ref 26.0–34.0)
MCHC: 34.2 g/dL (ref 30.0–36.0)
MCV: 89.8 fL (ref 78.0–100.0)
Platelets: 189 10*3/uL (ref 150–400)
RBC: 3.94 MIL/uL (ref 3.87–5.11)
RDW: 13.1 % (ref 11.5–15.5)
WBC: 6 10*3/uL (ref 4.0–10.5)

## 2014-12-04 LAB — TSH: TSH: 1.132 u[IU]/mL (ref 0.350–4.500)

## 2014-12-04 LAB — AMMONIA: Ammonia: 28 umol/L (ref 9–35)

## 2014-12-04 MED ORDER — PANTOPRAZOLE SODIUM 40 MG PO TBEC
40.0000 mg | DELAYED_RELEASE_TABLET | Freq: Every day | ORAL | Status: DC
  Administered 2014-12-04 – 2014-12-08 (×5): 40 mg via ORAL
  Filled 2014-12-04 (×5): qty 1

## 2014-12-04 MED ORDER — INFLUENZA VAC SPLIT QUAD 0.5 ML IM SUSY
0.5000 mL | PREFILLED_SYRINGE | INTRAMUSCULAR | Status: AC
  Administered 2014-12-05: 0.5 mL via INTRAMUSCULAR
  Filled 2014-12-04: qty 0.5

## 2014-12-04 MED ORDER — ALPRAZOLAM 0.5 MG PO TABS: ORAL_TABLET | ORAL | Status: DC

## 2014-12-04 MED ORDER — POLYVINYL ALCOHOL 1.4 % OP SOLN
1.0000 [drp] | Freq: Two times a day (BID) | OPHTHALMIC | Status: DC | PRN
  Administered 2014-12-05 – 2014-12-08 (×3): 1 [drp] via OPHTHALMIC
  Filled 2014-12-04: qty 15

## 2014-12-04 MED ORDER — BISACODYL 5 MG PO TBEC
5.0000 mg | DELAYED_RELEASE_TABLET | Freq: Every day | ORAL | Status: DC | PRN
  Administered 2014-12-04: 10 mg via ORAL
  Filled 2014-12-04 (×3): qty 2

## 2014-12-04 MED ORDER — MEMANTINE HCL ER 28 MG PO CP24
28.0000 mg | ORAL_CAPSULE | Freq: Every day | ORAL | Status: DC
  Administered 2014-12-04 – 2014-12-08 (×5): 28 mg via ORAL
  Filled 2014-12-04 (×7): qty 1

## 2014-12-04 MED ORDER — METOPROLOL TARTRATE 50 MG PO TABS
50.0000 mg | ORAL_TABLET | Freq: Two times a day (BID) | ORAL | Status: DC
  Administered 2014-12-04 – 2014-12-08 (×5): 50 mg via ORAL
  Filled 2014-12-04 (×9): qty 1

## 2014-12-04 MED ORDER — PILOCARPINE HCL 5 MG PO TABS
5.0000 mg | ORAL_TABLET | Freq: Two times a day (BID) | ORAL | Status: DC
  Administered 2014-12-04 – 2014-12-08 (×9): 5 mg via ORAL
  Filled 2014-12-04 (×13): qty 1

## 2014-12-04 MED ORDER — CEFTRIAXONE SODIUM 1 G IJ SOLR
1.0000 g | INTRAMUSCULAR | Status: DC
Start: 2014-12-04 — End: 2014-12-06
  Administered 2014-12-04 – 2014-12-05 (×2): 1 g via INTRAVENOUS
  Filled 2014-12-04 (×3): qty 10

## 2014-12-04 MED ORDER — ASPIRIN 300 MG RE SUPP: 300.0000 mg | Freq: Every day | RECTAL | Status: DC

## 2014-12-04 MED ORDER — LORATADINE 10 MG PO TABS
10.0000 mg | ORAL_TABLET | Freq: Every day | ORAL | Status: DC
  Administered 2014-12-04 – 2014-12-08 (×5): 10 mg via ORAL
  Filled 2014-12-04 (×5): qty 1

## 2014-12-04 MED ORDER — HYDROXYCHLOROQUINE SULFATE 200 MG PO TABS
200.0000 mg | ORAL_TABLET | Freq: Every day | ORAL | Status: DC
  Administered 2014-12-04 – 2014-12-08 (×5): 200 mg via ORAL
  Filled 2014-12-04 (×5): qty 1

## 2014-12-04 MED ORDER — LUBIPROSTONE 24 MCG PO CAPS
24.0000 ug | ORAL_CAPSULE | Freq: Two times a day (BID) | ORAL | Status: DC
  Administered 2014-12-04 – 2014-12-08 (×9): 24 ug via ORAL
  Filled 2014-12-04 (×9): qty 1

## 2014-12-04 MED ORDER — ENOXAPARIN SODIUM 40 MG/0.4ML ~~LOC~~ SOLN
40.0000 mg | SUBCUTANEOUS | Status: DC
  Administered 2014-12-04 – 2014-12-07 (×4): 40 mg via SUBCUTANEOUS
  Filled 2014-12-04 (×5): qty 0.4

## 2014-12-04 MED ORDER — ALPRAZOLAM 0.5 MG PO TABS
0.5000 mg | ORAL_TABLET | Freq: Two times a day (BID) | ORAL | Status: DC | PRN
  Administered 2014-12-06: 0.5 mg via ORAL
  Filled 2014-12-04: qty 1

## 2014-12-04 MED ORDER — HYDROCODONE-ACETAMINOPHEN 5-325 MG PO TABS
1.0000 | ORAL_TABLET | Freq: Two times a day (BID) | ORAL | Status: DC | PRN
  Administered 2014-12-04 – 2014-12-08 (×6): 1 via ORAL
  Filled 2014-12-04 (×7): qty 1

## 2014-12-04 MED ORDER — DILTIAZEM HCL ER COATED BEADS 180 MG PO CP24
180.0000 mg | ORAL_CAPSULE | Freq: Every day | ORAL | Status: DC
  Administered 2014-12-04 – 2014-12-08 (×5): 180 mg via ORAL
  Filled 2014-12-04 (×5): qty 1

## 2014-12-04 MED ORDER — SODIUM CHLORIDE 0.9 % IV SOLN
INTRAVENOUS | Status: AC
  Administered 2014-12-04: 03:00:00 via INTRAVENOUS

## 2014-12-04 MED ORDER — PRAVASTATIN SODIUM 20 MG PO TABS
20.0000 mg | ORAL_TABLET | Freq: Every day | ORAL | Status: DC
  Administered 2014-12-04 – 2014-12-07 (×4): 20 mg via ORAL
  Filled 2014-12-04 (×4): qty 1

## 2014-12-04 MED ORDER — TRIAMCINOLONE ACETONIDE 0.1 % EX OINT: 1.0000 "application " | TOPICAL_OINTMENT | Freq: Two times a day (BID) | CUTANEOUS | Status: DC | PRN

## 2014-12-04 MED ORDER — DULOXETINE HCL 30 MG PO CPEP
30.0000 mg | ORAL_CAPSULE | Freq: Every day | ORAL | Status: DC
  Administered 2014-12-04 – 2014-12-08 (×5): 30 mg via ORAL
  Filled 2014-12-04 (×5): qty 1

## 2014-12-04 MED ORDER — DULOXETINE HCL 60 MG PO CPEP
60.0000 mg | ORAL_CAPSULE | Freq: Every evening | ORAL | Status: DC
  Administered 2014-12-04 – 2014-12-07 (×4): 60 mg via ORAL
  Filled 2014-12-04 (×4): qty 1

## 2014-12-04 MED ORDER — ASPIRIN 325 MG PO TABS
325.0000 mg | ORAL_TABLET | Freq: Every day | ORAL | Status: DC
  Administered 2014-12-04 – 2014-12-08 (×5): 325 mg via ORAL
  Filled 2014-12-04 (×6): qty 1

## 2014-12-04 MED ORDER — SALINE SPRAY 0.65 % NA SOLN: 1.0000 | NASAL | Status: DC | PRN

## 2014-12-04 MED ORDER — HYDRALAZINE HCL 20 MG/ML IJ SOLN: 10.0000 mg | INTRAMUSCULAR | Status: DC | PRN

## 2014-12-04 MED ORDER — PNEUMOCOCCAL VAC POLYVALENT 25 MCG/0.5ML IJ INJ
0.5000 mL | INJECTION | INTRAMUSCULAR | Status: AC
  Administered 2014-12-05: 0.5 mL via INTRAMUSCULAR
  Filled 2014-12-04: qty 0.5

## 2014-12-04 MED ORDER — PREGABALIN 75 MG PO CAPS
75.0000 mg | ORAL_CAPSULE | Freq: Two times a day (BID) | ORAL | Status: DC
  Administered 2014-12-04 – 2014-12-08 (×9): 75 mg via ORAL
  Filled 2014-12-04 (×9): qty 1

## 2014-12-04 NOTE — Progress Notes (Signed)
Patient seen and examined, admitted by Dr. Hal Hope this morning  Briefly 81 year old female with paroxysmal atrial ablation, presented from home with a mechanical fall. Patient was having difficulty airing out of the commode, found to be confused. Labs revealed acute kidney injury, UTI. EKG with sinus bradycardia, failed swallow evaluation in ED.  BP 109/42 mmHg  Pulse 53  Temp(Src) 98.1 F (36.7 C) (Oral)  Resp 18  Ht 5\' 3"  (1.6 m)  Wt 70.3 kg (154 lb 15.7 oz)  BMI 27.46 kg/m2  SpO2 98%   Acute encephalopathy likely contributed to dehydration and UTI - Agree with current management and plan per admitting physician, Dr Hal Hope - MRI negative for acute CVA - Follow EEG - Continue IV Rocephin, follow urine culture and sensitivities - Continue IV fluid hydration - PT, OT, ST evaluation, may need placement, currently at home with home PT   [facility] M.D. [facility] 12/04/2014, 8:31 AM  Pager: [phone]

## 2014-12-04 NOTE — Progress Notes (Signed)
Patient has been having intermittent periods of confusion throughout day, including confusion of the medications she is receiving in relation to the color of the medications she is used to at home. I have educated the patient that [facility] gets its stock of medications from different pharmaceutical companies and that the size and shape of her medications may vary compared to the supply she has at home.   Each time medications are given I have verbalized what medication is being given and the dosage, this information will be passed onto night shift.

## 2014-12-04 NOTE — Progress Notes (Signed)
ANTIBIOTIC CONSULT NOTE - INITIAL  Pharmacy Consult for Rocephin Indication: UTI  Labs:  Recent Labs  12/03/14 1613  WBC 5.8  HGB 12.1  PLT 208  CREATININE 1.12*   Estimated Creatinine Clearance: 35.8 mL/min (by C-G formula based on Cr of 1.12).   Assessment/Plan:  81yo female had fall yesterday, discovered by [facility] aide, reports back pain, UA abnormal, to begin IV ABX.  Will start Rocephin 1g IV Q24H and monitor CBC and Cx.  Wynona Neat, PharmD, BCPS  12/04/2014,1:31 AM

## 2014-12-04 NOTE — H&P (Signed)
[facility] History and Physical  Ariana [name] AVW:[id number] DOB: 23-Oct-1929 DOA: 12/03/2014  Referring physician: Dr.Beatty. PCP: Hollace Kinnier, DO  Specialists: [place]. Cardiologist.  Chief Complaint: Fall. Confusion.  HPI: Ariana [name] is a 81 y.o. female with history of paroxysmal atrial fibrillation not on anticoagulants secondary to falls and has had a stroke in June of this year was brought to the ER the patient had a fall at home. As per patient daughter who provided the history patient's home health nurse was tried again drowsy yesterday but since patient was not opening the door patient's son-in-law went and opened the door and patient was found to be on the floor. Patient was not unconscious but does not recall falling. Patient's family tried to get into PCP office but was unable to make it because of transport issues. Today patient also had difficulty getting out of the commode. In addition patient is also found to be mildly confused. CT head and neck were unremarkable for anything acute. On exam patient has mild confusion periodically. Labs reveal acute renal failure and UTI. EKG shows sinus bradycardia. Patient had failed swallow. Patient is admitted for further management.   Review of Systems: As presented in the history of presenting illness, rest negative.  Past Medical History  Diagnosis Date  . Sjogren's syndrome ([place])   . Dry eye syndrome   . Hypertension, benign   . Mitral valve prolapse   . GERD (gastroesophageal reflux disease)   . Diverticulosis of colon   . Irritable bowel syndrome   . Urinary incontinence   . Low back pain syndrome   . Fibromyalgia   . Memory loss   . Anxiety and depression   . History of adverse drug reaction   . Peripheral neuropathy (HCC)     "both feet and legs"  . Shortness of breath 07/18/11    "alot lately"  . Anginal pain ([place])   . History of recurrent UTIs   . H/O hiatal hernia   . Anxiety   . Dementia   .  Depression   . Abnormality of gait   . Thyroid nodule   . Headache(784.0) 09/05/2012  . Adenomatous polyp of colon 2002    32mm  . History of cerebrovascular disease 09/24/2014   Past Surgical History  Procedure Laterality Date  . Vesicovaginal fistula closure w/ tah    . Appendectomy    . Cholecystectomy  2000  . Mandible surgery    . Temporomandibular joint surgery  1986    Dr. Terence Lux  . Cataract extraction, bilateral    . Abdominal hysterectomy  1967  . Dental surgery      multiple tooth extractions  . Esophagogastroduodenoscopy (egd) with esophageal dilation N/A 08/23/2012    Procedure: ESOPHAGOGASTRODUODENOSCOPY (EGD) WITH ESOPHAGEAL DILATION;  Surgeon: Milus Banister, MD;  Location: [facility];  Service: Endoscopy;  Laterality: N/A;  . Colonoscopy w/ biopsies      multiple  . Nasal septum surgery  1980  . Transthoracic echocardiogram  2001    mild LVH, normal LV  . Nm myocar perf wall motion  2003    persantine - normal static and dynamic study w/apical thinning and presvered LV function, no ischemia  . Cardiac catheterization  02/17/2003    normal L main, LAD free of disease, Cfx free of disease, RCA free of disease (Dr. Rockne Menghini)   Social History:  reports that she has quit smoking. She has never used smokeless tobacco. She reports that she does  not drink alcohol or use illicit drugs. Where does patient live home. Can patient participate in ADLs? Yes.  Allergies  Allergen Reactions  . Banana Nausea And Vomiting  . Codeine Nausea Only    unless given with Phenergan  . Doxycycline     Unknown  . Klonopin [Clonazepam]     Causes hallucination   . Meperidine Hcl Nausea Only    unless given with Phenergan  . Naproxen   . Norflex [Orphenadrine Citrate] Nausea Only    Unless given with Phenergan  . Oxycodone-Acetaminophen Nausea Only    unless given with phenergan  . Penicillins     Unknown  . Phenothiazines     Unknown  . Propoxyphene Hcl Nausea Only     unless given with phenergan  . Stelazine     Unknown  . Sulfamethoxazole-Trimethoprim     Unknown  . Tolectin [Tolmetin Sodium]     Unknown  . Tramadol     Unknown  . Zoloft [Sertraline Hcl]     Caused pt to sleep a lot    Family History:  Family History  Problem Relation Age of Onset  . Heart disease Father     heart attack  . Pneumonia Mother   . Heart attack Mother   . Hypertension Mother   . Hypertension Maternal Grandmother   . Colon cancer Sister   . Kidney disease Daughter   . Asthma Daughter   . Arthritis Daughter 53    osteo,  . Heart disease Son 70    stage 3 CHF(Diastolic /Systolic)  . Throat cancer Brother       Prior to Admission medications   Medication Sig Start Date End Date Taking? Authorizing Provider  ALPRAZolam Duanne Moron) 0.5 MG tablet TAKE 1 TABLET BY MOUTH TWICE A DAY FOR ANXIETY 11/02/14   Tiffany L Reed, DO  aspirin 325 MG tablet Take 1 tablet (325 mg total) by mouth daily. 07/28/14   Reyne Dumas, MD  bisacodyl (BISACODYL) 5 MG EC tablet Take 2 tablets (10 mg total) by mouth daily as needed for moderate constipation. If no effective bowel movement every 2- 3 days Patient taking differently: Take 5-10 mg by mouth daily as needed for moderate constipation. If no effective bowel movement every 2- 3 days 07/08/14   Gatha Mayer, MD  CALCIUM CARBONATE PO Take 1 tablet by mouth daily.    Historical Provider, MD  carboxymethylcellulose (REFRESH TEARS) 0.5 % SOLN 1 drop 2 (two) times daily as needed (dry eyes).     Historical Provider, MD  cetirizine (ZYRTEC) 10 MG tablet Take 1 tablet (10 mg total) by mouth daily. 03/23/14   Tiffany L Reed, DO  CRANBERRY SOFT PO Take 1 capsule by mouth daily.    Historical Provider, MD  diltiazem (CARDIZEM CD) 180 MG 24 hr capsule Take 1 capsule (180 mg total) by mouth daily. 07/03/14   Pixie Casino, MD  DULoxetine (CYMBALTA) 30 MG capsule Take one tablet  once daily in the morning for depression 09/24/14   Kathrynn Ducking, MD   DULoxetine (CYMBALTA) 60 MG capsule Take one tablet by mouth in the evening once daily for depression 09/24/14   Kathrynn Ducking, MD  hydrochlorothiazide (HYDRODIURIL) 25 MG tablet Take 1 tablet (25 mg total) by mouth daily. 11/05/14   Pixie Casino, MD  HYDROcodone-acetaminophen (NORCO/VICODIN) 5-325 MG tablet Take one tablet twice daily for pain 11/20/14   Gildardo Cranker, DO  hydroxychloroquine (PLAQUENIL) 200 MG tablet Take 200 mg  by mouth daily.     Historical Provider, MD  lactose free nutrition (BOOST PLUS) LIQD Take 237 mLs by mouth 2 (two) times daily between meals. 07/22/13   Maryann Mikhail, DO  lansoprazole (PREVACID) 30 MG capsule TAKE ONE CAPSULE BY MOUTH EVERY DAY AT 12 NOON 08/21/14   Tiffany L Reed, DO  lubiprostone (AMITIZA) 24 MCG capsule Take 1 capsule (24 mcg total) by mouth 2 (two) times daily with a meal. 07/08/14   Gatha Mayer, MD  LYRICA 75 MG capsule TAKE ONE CAPSULE BY MOUTH TWICE A DAY 11/13/14   Tiffany L Reed, DO  memantine (NAMENDA XR) 28 MG CP24 24 hr capsule Take one capsule by mouth once daily to preserve memory 11/13/14   Tiffany L Reed, DO  metoprolol (LOPRESSOR) 50 MG tablet Take 1 tablet (50 mg total) by mouth 2 (two) times daily. 07/03/14   Pixie Casino, MD  nystatin (MYCOSTATIN) 100000 UNIT/ML suspension TAKE 1 TEASPOONFUL BY MOUTH 4 TIMES A DAY as needed for mouth pain 09/04/14   Tiffany L Reed, DO  pilocarpine (SALAGEN) 5 MG tablet Take 5 mg by mouth 2 (two) times daily.    Historical Provider, MD  pravastatin (PRAVACHOL) 20 MG tablet Take 1 tablet (20 mg total) by mouth daily at 6 PM. 10/23/14   Tiffany L Reed, DO  sodium chloride (OCEAN) 0.65 % SOLN nasal spray Place 1 spray into the nose as needed for congestion. 08/23/12   Modena Jansky, MD  triamcinolone ointment (KENALOG) 0.1 % Apply 1 application topically 2 (two) times daily as needed (rash).  04/01/14   Historical Provider, MD    Physical Exam: Filed Vitals:   12/03/14 1715 12/03/14 1730 12/03/14 2025  12/03/14 2225  BP: 104/49 98/58 131/53 144/58  Pulse: 53 52 53 58  Temp:    98.1 F (36.7 C)  TempSrc:    Oral  Resp:   14 19  SpO2: 94% 92% 94% 98%     General:  Moderately built and nourished.  Eyes: Anicteric no pallor.  ENT: No discharge from the ears eyes nose or mouth.  Neck: No mass felt.  Cardiovascular: S1 and S2 heard.  Respiratory: No rhonchi or crepitations.  Abdomen: Soft nontender bowel sounds present.  Skin: No rash.  Musculoskeletal: No edema.  Psychiatric: Alert awake oriented to her name and place.  Neurologic: Alert awake oriented to name and place and moves all extremities 5 x 5. No facial asymmetry.  Labs on Admission:  Basic Metabolic Panel:  Recent Labs Lab 12/03/14 1613  NA 135  K 3.9  CL 96*  CO2 31  GLUCOSE 118*  BUN 29*  CREATININE 1.12*  CALCIUM 9.7   Liver Function Tests:  Recent Labs Lab 12/03/14 1613  AST 34  ALT 21  ALKPHOS 90  BILITOT 0.4  PROT 7.1  ALBUMIN 3.8   No results for input(s): LIPASE, AMYLASE in the last 168 hours. No results for input(s): AMMONIA in the last 168 hours. CBC:  Recent Labs Lab 12/03/14 1613  WBC 5.8  HGB 12.1  HCT 36.9  MCV 91.1  PLT 208   Cardiac Enzymes:  Recent Labs Lab 12/03/14 1613  CKTOTAL 287*    BNP (last 3 results) No results for input(s): BNP in the last 8760 hours.  ProBNP (last 3 results) No results for input(s): PROBNP in the last 8760 hours.  CBG: No results for input(s): GLUCAP in the last 168 hours.  Radiological Exams on Admission: Dg Chest  2 View  12/03/2014  CLINICAL DATA:  Fatigue and altered mental status.  Recent fall. EXAM: CHEST  2 VIEW COMPARISON:  05/06/2013. FINDINGS: The heart is mildly enlarged but stable. There is mild tortuosity and calcification of the thoracic aorta. The lungs demonstrate mild chronic bronchitic and emphysematous changes and left basilar scarring. No infiltrates or effusions. The bony thorax is intact. IMPRESSION: No  acute cardiopulmonary findings.  Chronic lung changes Electronically Signed   By: Marijo Sanes M.D.   On: 12/03/2014 19:01   Ct Head Wo Contrast  12/03/2014  CLINICAL DATA:  Recent fall, found lying on ground, initial encounter EXAM: CT HEAD WITHOUT CONTRAST CT CERVICAL SPINE WITHOUT CONTRAST TECHNIQUE: Multidetector CT imaging of the head and cervical spine was performed following the standard protocol without intravenous contrast. Multiplanar CT image reconstructions of the cervical spine were also generated. COMPARISON:  07/26/2014 FINDINGS: CT HEAD FINDINGS Bony calvarium is intact. Diffuse atrophic changes are seen. A chronic right parietal infarct is noted as well as scattered areas of chronic white matter ischemic change. There is also evidence of prior infarct in the right subinsular region. No acute hemorrhage, acute infarction or space-occupying mass lesion are not seen. CT CERVICAL SPINE FINDINGS Seven cervical segments are well visualized. Vertebral body height is well maintained. Mild osteophytic changes and facet hypertrophic changes are seen. No findings to suggest acute fracture or acute facet abnormality are noted. Stable left thyroid lesion which is been previously biopsied is noted. No other soft tissue abnormality is seen. The visualized lung apices are within normal limits. IMPRESSION: CT of the head: Chronic areas of previous ischemia.  No acute abnormality is noted. CT of cervical spine: Degenerative change without acute abnormality. Stable left thyroid lesion Electronically Signed   By: Inez Catalina M.D.   On: 12/03/2014 17:00   Ct Cervical Spine Wo Contrast  12/03/2014  CLINICAL DATA:  Recent fall, found lying on ground, initial encounter EXAM: CT HEAD WITHOUT CONTRAST CT CERVICAL SPINE WITHOUT CONTRAST TECHNIQUE: Multidetector CT imaging of the head and cervical spine was performed following the standard protocol without intravenous contrast. Multiplanar CT image reconstructions of  the cervical spine were also generated. COMPARISON:  07/26/2014 FINDINGS: CT HEAD FINDINGS Bony calvarium is intact. Diffuse atrophic changes are seen. A chronic right parietal infarct is noted as well as scattered areas of chronic white matter ischemic change. There is also evidence of prior infarct in the right subinsular region. No acute hemorrhage, acute infarction or space-occupying mass lesion are not seen. CT CERVICAL SPINE FINDINGS Seven cervical segments are well visualized. Vertebral body height is well maintained. Mild osteophytic changes and facet hypertrophic changes are seen. No findings to suggest acute fracture or acute facet abnormality are noted. Stable left thyroid lesion which is been previously biopsied is noted. No other soft tissue abnormality is seen. The visualized lung apices are within normal limits. IMPRESSION: CT of the head: Chronic areas of previous ischemia.  No acute abnormality is noted. CT of cervical spine: Degenerative change without acute abnormality. Stable left thyroid lesion Electronically Signed   By: Inez Catalina M.D.   On: 12/03/2014 17:00   Dg Hand Complete Left  12/03/2014  CLINICAL DATA:  Left hand pain and swelling after fall onto an outstretched hand. Pain about the metacarpal phalangeal joints. EXAM: LEFT HAND - COMPLETE 3+ VIEW COMPARISON:  Radiographs 02/09/2008 FINDINGS: Patient unable to remove rings from the fourth digit. No acute fracture. Progressive osteoarthritis involving the thumb, index and middle finger  metacarpal phalangeal joints with increased joint space narrowing. Additional scattered osteoarthritis throughout the left hand. No dislocation. There is mild dorsal soft tissue edema. No radiopaque foreign body. IMPRESSION: 1. No acute fracture or dislocation. 2. 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 Electronically Signed   By: Jeb Levering M.D.   On: 12/03/2014 19:03    EKG: Independently reviewed. Sinus bradycardia  with QTC of 485 ms.  Assessment/Plan Principal Problem:   Acute encephalopathy Active Problems:   HYPERTENSION, BENIGN   SJOGREN'S SYNDROME   UTI (lower urinary tract infection)   AKI (acute kidney injury) ([place])   1. Acute encephalopathy probably secondary to dehydration and UTI - patient has been placed on gentle hydration and antibiotics for UTI. However patient does not recall falling. For which I have ordered EEG and also MRI brain. Check ammonia levels. 2. Acute renal failure - probably PEG now. Gently hydrate him closely for metabolic panel. 3. Paroxysmal atrial fibrillation - presently mildly bradycardic. Closely monitor in telemetry. May need to reduce patient's rate limiting medications. Chads 2 vasc score is more than 6 but patient is not on anticoagulants secondary to falls. 4. History of falls - consult physical therapy. 5. Hypertension - continue home medications. Patient has failed swallow and I have placed patient on when necessary IV hydralazine for now. Patient may need to resume her home medications once passes swallow. 6. UTI - on ceftriaxone. Check urine culture. 7. History of Sjogren syndrome. 8. History of dementia.  I have reviewed patient's old charts were labs. Personally reviewed patient's chest x-ray and EKG. X-ray pelvis is pending.   DVT Prophylaxis Lovenox.  Code Status: Full code.  Family Communication: Discussed with patient's daughter.  Disposition Plan: Admit to inpatient.    Alonni Heimsoth N. [facility] Pager [phone].  If 7PM-7AM, please contact night-coverage [email or web address] Password [email or web address] 12/04/2014, 1:20 AM

## 2014-12-04 NOTE — Telephone Encounter (Signed)
Daughter, Amandalynn called and stated patient needs a refill. Called into pharmacy.

## 2014-12-04 NOTE — Evaluation (Addendum)
Clinical/Bedside Swallow Evaluation Patient Details  Name: Ariana [name] MRN: [id number] Date of Birth: August 18, 1929  Today's Date: 12/04/2014 Time: SLP Start Time (ACUTE ONLY): 0900 SLP Stop Time (ACUTE ONLY): 0920 SLP Time Calculation (min) (ACUTE ONLY): 20 min  Past Medical History:  Past Medical History  Diagnosis Date  . Sjogren's syndrome ([place])   . Dry eye syndrome   . Hypertension, benign   . Mitral valve prolapse   . GERD (gastroesophageal reflux disease)   . Diverticulosis of colon   . Irritable bowel syndrome   . Urinary incontinence   . Low back pain syndrome   . Fibromyalgia   . Memory loss   . Anxiety and depression   . History of adverse drug reaction   . Peripheral neuropathy (HCC)     "both feet and legs"  . Shortness of breath 07/18/11    "alot lately"  . Anginal pain ([place])   . History of recurrent UTIs   . H/O hiatal hernia   . Anxiety   . Dementia   . Depression   . Abnormality of gait   . Thyroid nodule   . Headache(784.0) 09/05/2012  . Adenomatous polyp of colon 2002    77mm  . History of cerebrovascular disease 09/24/2014   Past Surgical History:  Past Surgical History  Procedure Laterality Date  . Vesicovaginal fistula closure w/ tah    . Appendectomy    . Cholecystectomy  2000  . Mandible surgery    . Temporomandibular joint surgery  1986    Dr. Terence Lux  . Cataract extraction, bilateral    . Abdominal hysterectomy  1967  . Dental surgery      multiple tooth extractions  . Esophagogastroduodenoscopy (egd) with esophageal dilation N/A 08/23/2012    Procedure: ESOPHAGOGASTRODUODENOSCOPY (EGD) WITH ESOPHAGEAL DILATION;  Surgeon: Milus Banister, MD;  Location: [facility];  Service: Endoscopy;  Laterality: N/A;  . Colonoscopy w/ biopsies      multiple  . Nasal septum surgery  1980  . Transthoracic echocardiogram  2001    mild LVH, normal LV  . Nm myocar perf wall motion  2003    persantine - normal static and dynamic study w/apical  thinning and presvered LV function, no ischemia  . Cardiac catheterization  02/17/2003    normal L main, LAD free of disease, Cfx free of disease, RCA free of disease (Dr. Rockne Menghini)   HPI:  Pt is an 81 year old female admitted for acute encephalopathy probably secondary to dehydration and UTI per MD. MRI negative, no findings on chest CXR.    Assessment / Plan / Recommendation Clinical Impression  Pt demonstrates normal swallow function. She does report a mild history of GERD and is noted to grimace slightly with swallow. She also reports difficulty chewing meats due to dentures. Will place pt on a dys 3 (mechanical soft) diet with thin liquids with basic aspiration precautions. No SLP f/u needed. Will also defer speech and language eval as pt is [facility] and has appropriate assist at home for baseline cognitive deficits.     Aspiration Risk  Mild    Diet Recommendation Dysphagia 3 (Mech soft);Thin   Medication Administration: Whole meds with liquid    Other  Recommendations Oral Care Recommendations: Oral care BID   Follow Up Recommendations       Frequency and Duration        Pertinent Vitals/Pain NA    SLP Swallow Goals     Swallow Study Prior Functional  Status       General Other Pertinent Information: Pt is an 81 year old female admitted for acute encephalopathy probably secondary to dehydration and UTI per MD. MRI negative, no findings on chest CXR.  Type of Study: Bedside swallow evaluation Diet Prior to this Study: NPO Temperature Spikes Noted: No Respiratory Status: Room air History of Recent Intubation: No Behavior/Cognition: Alert;Cooperative;Pleasant mood Oral Cavity - Dentition:  (dentures) Self-Feeding Abilities: Able to feed self Patient Positioning: Upright in bed Baseline Vocal Quality: Hoarse Volitional Cough: Strong Volitional Swallow: Able to elicit    Oral/Motor/Sensory Function Overall Oral Motor/Sensory Function: Appears within functional limits for  tasks assessed   Ice Chips     Thin Liquid Thin Liquid: Within functional limits    Nectar Thick Nectar Thick Liquid: Not tested   Honey Thick Honey Thick Liquid: Not tested   Puree Puree: Within functional limits   Solid   GO    Solid: Within functional limits      [facility], [place] CCC-SLP [phone]  Janyce Ellinger, Katherene Ponto 12/04/2014,9:40 AM

## 2014-12-05 DIAGNOSIS — N390 Urinary tract infection, site not specified: Secondary | ICD-10-CM | POA: Diagnosis not present

## 2014-12-05 LAB — CBC
HEMATOCRIT: 34.7 % — AB (ref 36.0–46.0)
Hemoglobin: 11.6 g/dL — ABNORMAL LOW (ref 12.0–15.0)
MCH: 30.4 pg (ref 26.0–34.0)
MCHC: 33.4 g/dL (ref 30.0–36.0)
MCV: 90.8 fL (ref 78.0–100.0)
PLATELETS: 203 10*3/uL (ref 150–400)
RBC: 3.82 MIL/uL — ABNORMAL LOW (ref 3.87–5.11)
RDW: 13.5 % (ref 11.5–15.5)
WBC: 5.5 10*3/uL (ref 4.0–10.5)

## 2014-12-05 LAB — BASIC METABOLIC PANEL
ANION GAP: 9 (ref 5–15)
BUN: 12 mg/dL (ref 6–20)
CALCIUM: 9.4 mg/dL (ref 8.9–10.3)
CO2: 30 mmol/L (ref 22–32)
CREATININE: 0.62 mg/dL (ref 0.44–1.00)
Chloride: 101 mmol/L (ref 101–111)
Glucose, Bld: 113 mg/dL — ABNORMAL HIGH (ref 65–99)
Potassium: 3.4 mmol/L — ABNORMAL LOW (ref 3.5–5.1)
SODIUM: 140 mmol/L (ref 135–145)

## 2014-12-05 MED ORDER — ASPIRIN 325 MG PO TABS: 325.0000 mg | ORAL_TABLET | Freq: Every day | ORAL | Status: DC

## 2014-12-05 MED ORDER — POLYETHYLENE GLYCOL 3350 17 G PO PACK
17.0000 g | PACK | Freq: Every day | ORAL | Status: AC
  Administered 2014-12-05 – 2014-12-06 (×2): 17 g via ORAL
  Filled 2014-12-05 (×2): qty 1

## 2014-12-05 MED ORDER — POTASSIUM CHLORIDE 20 MEQ/15ML (10%) PO SOLN
40.0000 meq | Freq: Two times a day (BID) | ORAL | Status: AC
  Administered 2014-12-05 (×2): 40 meq via ORAL
  Filled 2014-12-05 (×3): qty 30

## 2014-12-05 MED ORDER — HYDROCHLOROTHIAZIDE 25 MG PO TABS
25.0000 mg | ORAL_TABLET | Freq: Every day | ORAL | Status: DC
Start: 2014-12-05 — End: 2014-12-08
  Administered 2014-12-05 – 2014-12-07 (×3): 25 mg via ORAL
  Filled 2014-12-05 (×3): qty 1

## 2014-12-05 NOTE — Progress Notes (Signed)
[facility] PROGRESS NOTE    Progress Note   Ariana [name]:[id number] DOB: Jun 19, 1929 DOA: 12/03/2014 PCP: Hollace Kinnier, DO   Brief Narrative:   Ariana [name] is an 81 y.o. female who came in confused  Assessment/Plan:   Acute encephalopathy possibly due to UTI: - Started empirically on gentle hydration and IV antibiotics, MRI showed no acute infarcts age-related atrophy. - EGGs pending. - Continue IV Rocephin urine cultures are pending. - Has remained afebrile no leukocytosis. - Physical therapy evaluation is pending.  Constipation: Get a tap water enema start her on MiraLAX.  Acute kidney injury: Prerenal, resolved with IV hydration.  Hypokalemia: replete orally.  HYPERTENSION, BENIGN Continue current home regimen.  SJOGREN'S SYNDROME  Paroxysmal atrial fibrillation Not an anticoagulation secondary to falls.    DVT Prophylaxis - Lovenox ordered.  Family Communication: non Disposition Plan: Home when stable. Code Status:     Code Status Orders        Start     Ordered   12/04/14 0117  Full code   Continuous     12/04/14 0119    Advance Directive Documentation        Most Recent Value   Type of Advance Directive  Living will   Pre-existing out of facility DNR order (yellow form or pink MOST form)     "MOST" Form in Place?          IV Access:    Peripheral IV   Procedures and diagnostic studies:   Dg Chest 2 View  12/03/2014  CLINICAL DATA:  Fatigue and altered mental status.  Recent fall. EXAM: CHEST  2 VIEW COMPARISON:  05/06/2013. FINDINGS: The heart is mildly enlarged but stable. There is mild tortuosity and calcification of the thoracic aorta. The lungs demonstrate mild chronic bronchitic and emphysematous changes and left basilar scarring. No infiltrates or effusions. The bony thorax is intact. IMPRESSION: No acute cardiopulmonary findings.  Chronic lung changes Electronically Signed   By: Marijo Sanes M.D.   On:  12/03/2014 19:01   Ct Head Wo Contrast  12/03/2014  CLINICAL DATA:  Recent fall, found lying on ground, initial encounter EXAM: CT HEAD WITHOUT CONTRAST CT CERVICAL SPINE WITHOUT CONTRAST TECHNIQUE: Multidetector CT imaging of the head and cervical spine was performed following the standard protocol without intravenous contrast. Multiplanar CT image reconstructions of the cervical spine were also generated. COMPARISON:  07/26/2014 FINDINGS: CT HEAD FINDINGS Bony calvarium is intact. Diffuse atrophic changes are seen. A chronic right parietal infarct is noted as well as scattered areas of chronic white matter ischemic change. There is also evidence of prior infarct in the right subinsular region. No acute hemorrhage, acute infarction or space-occupying mass lesion are not seen. CT CERVICAL SPINE FINDINGS Seven cervical segments are well visualized. Vertebral body height is well maintained. Mild osteophytic changes and facet hypertrophic changes are seen. No findings to suggest acute fracture or acute facet abnormality are noted. Stable left thyroid lesion which is been previously biopsied is noted. No other soft tissue abnormality is seen. The visualized lung apices are within normal limits. IMPRESSION: CT of the head: Chronic areas of previous ischemia.  No acute abnormality is noted. CT of cervical spine: Degenerative change without acute abnormality. Stable left thyroid lesion Electronically Signed   By: Inez Catalina M.D.   On: 12/03/2014 17:00   Ct Cervical Spine Wo Contrast  12/03/2014  CLINICAL DATA:  Recent fall, found lying on ground, initial encounter EXAM: CT HEAD WITHOUT CONTRAST  CT CERVICAL SPINE WITHOUT CONTRAST TECHNIQUE: Multidetector CT imaging of the head and cervical spine was performed following the standard protocol without intravenous contrast. Multiplanar CT image reconstructions of the cervical spine were also generated. COMPARISON:  07/26/2014 FINDINGS: CT HEAD FINDINGS Bony calvarium  is intact. Diffuse atrophic changes are seen. A chronic right parietal infarct is noted as well as scattered areas of chronic white matter ischemic change. There is also evidence of prior infarct in the right subinsular region. No acute hemorrhage, acute infarction or space-occupying mass lesion are not seen. CT CERVICAL SPINE FINDINGS Seven cervical segments are well visualized. Vertebral body height is well maintained. Mild osteophytic changes and facet hypertrophic changes are seen. No findings to suggest acute fracture or acute facet abnormality are noted. Stable left thyroid lesion which is been previously biopsied is noted. No other soft tissue abnormality is seen. The visualized lung apices are within normal limits. IMPRESSION: CT of the head: Chronic areas of previous ischemia.  No acute abnormality is noted. CT of cervical spine: Degenerative change without acute abnormality. Stable left thyroid lesion Electronically Signed   By: Inez Catalina M.D.   On: 12/03/2014 17:00   Mr Brain Wo Contrast  12/04/2014  CLINICAL DATA:  Initial evaluation for acute encephalopathy. EXAM: MRI HEAD WITHOUT CONTRAST TECHNIQUE: Multiplanar, multiecho pulse sequences of the brain and surrounding structures were obtained without intravenous contrast. COMPARISON:  Prior CT from 12/03/2014. FINDINGS: Diffuse prominence of the CSF containing spaces is compatible with age-related cerebral atrophy. Mild T2/FLAIR hyperintensity within the periventricular white matter likely related to chronic small vessel ischemic disease. Focal encephalomalacia within the right frontal lobe and right parietal lobe present, consistent with remote infarcts. No abnormal foci of restricted diffusion to suggest acute intracranial infarct. Gray-white matter differentiation maintained. Normal intravascular flow voids are preserved. No acute or chronic intracranial hemorrhage. No mass lesion, midline shift, or mass effect. No hydrocephalus. No extra-axial  fluid collection. Craniocervical junction within normal limits. Pituitary gland normal. No acute abnormality about the orbits. Sequela prior bilateral lens extraction noted. Mild mucosal thickening within the ethmoidal air cells. Right maxillary sinus is hypoplastic in appearance. No evidence for active sinus infection. No mastoid effusion. Inner ear structures normal. Bone marrow signal intensity within normal limits. No scalp soft tissue abnormality. IMPRESSION: 1. No acute intracranial infarct or other process identified. 2. Age-related cerebral atrophy with mild chronic small vessel ischemic disease and remote right frontal and parietal lobe infarcts. Electronically Signed   By: Jeannine Boga M.D.   On: 12/04/2014 03:01   Dg Pelvis Portable  12/04/2014  CLINICAL DATA:  Fall. EXAM: PORTABLE PELVIS 1-2 VIEWS COMPARISON:  None. FINDINGS: No acute bony or joint abnormality identified. Degenerative changes both hips. No evidence of fracture or dislocation. IMPRESSION: No acute bony or joint abnormality.  Degenerative changes both hips. Electronically Signed   By: Marcello Moores  Register   On: 12/04/2014 08:09   Dg Hand Complete Left  12/03/2014  CLINICAL DATA:  Left hand pain and swelling after fall onto an outstretched hand. Pain about the metacarpal phalangeal joints. EXAM: LEFT HAND - COMPLETE 3+ VIEW COMPARISON:  Radiographs 02/09/2008 FINDINGS: Patient unable to remove rings from the fourth digit. No acute fracture. Progressive osteoarthritis involving the thumb, index and middle finger metacarpal phalangeal joints with increased joint space narrowing. Additional scattered osteoarthritis throughout the left hand. No dislocation. There is mild dorsal soft tissue edema. No radiopaque foreign body. IMPRESSION: 1. No acute fracture or dislocation. 2. 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 Electronically Signed   By: Jeb Levering M.D.   On: 12/03/2014 19:03     Medical  Consultants:    None.  Anti-Infectives:   Anti-infectives    Start     Dose/Rate Route Frequency Ordered Stop   12/04/14 2100  cefTRIAXone (ROCEPHIN) 1 g in dextrose 5 % 50 mL IVPB     1 g 100 mL/hr over 30 Minutes Intravenous Every 24 hours 12/04/14 0134     12/04/14 1000  hydroxychloroquine (PLAQUENIL) tablet 200 mg     200 mg Oral Daily 12/04/14 0119     12/03/14 2100  cefTRIAXone (ROCEPHIN) 1 g in dextrose 5 % 50 mL IVPB     1 g 100 mL/hr over 30 Minutes Intravenous  Once 12/03/14 2046 12/03/14 2212      Subjective:    Ariana [name] she doesn't remember exactly what happened, she is complaining about not able to have a bowel movement.  Objective:    Filed Vitals:   12/04/14 1441 12/04/14 2112 12/05/14 0502 12/05/14 1001  BP: 118/43 105/52 120/47 140/56  Pulse: 63 60 50 57  Temp: 98.3 F (36.8 C) 98.4 F (36.9 C) 97.5 F (36.4 C)   TempSrc: Oral Oral Oral   Resp: 16 18 18    Height:      Weight:      SpO2: 97% 98% 92%     Intake/Output Summary (Last 24 hours) at 12/05/14 1027 Last data filed at 12/05/14 0931  Gross per 24 hour  Intake    436 ml  Output    800 ml  Net   -364 ml   Filed Weights   12/04/14 0100  Weight: 70.3 kg (154 lb 15.7 oz)    Exam: Gen:  NAD Cardiovascular:  RRR, No M/R/G Chest and lungs:   CTAB Abdomen:  Abdomen soft, NT/ND, + BS Extremities:  No C/E/C   Data Reviewed:    Labs: Basic Metabolic Panel:  Recent Labs Lab 12/03/14 1613 12/04/14 0136 12/05/14 0353  NA 135 140 140  K 3.9 3.5 3.4*  CL 96* 101 101  CO2 31 29 30   GLUCOSE 118* 124* 113*  BUN 29* 19 12  CREATININE 1.12* 0.75 0.62  CALCIUM 9.7 9.5 9.4   GFR Estimated Creatinine Clearance: 50.1 mL/min (by C-G formula based on Cr of 0.62). Liver Function Tests:  Recent Labs Lab 12/03/14 1613 12/04/14 0136  AST 34 31  ALT 21 23  ALKPHOS 90 86  BILITOT 0.4 0.8  PROT 7.1 6.8  ALBUMIN 3.8 3.6   No results for input(s): LIPASE, AMYLASE in the last  168 hours.  Recent Labs Lab 12/04/14 0815  AMMONIA 28   Coagulation profile No results for input(s): INR, PROTIME in the last 168 hours.  CBC:  Recent Labs Lab 12/03/14 1613 12/04/14 0136 12/05/14 0353  WBC 5.8 6.0 5.5  HGB 12.1 12.1 11.6*  HCT 36.9 35.4* 34.7*  MCV 91.1 89.8 90.8  PLT 208 189 203   Cardiac Enzymes:  Recent Labs Lab 12/03/14 1613  CKTOTAL 287*   BNP (last 3 results) No results for input(s): PROBNP in the last 8760 hours. CBG: No results for input(s): GLUCAP in the last 168 hours. D-Dimer: No results for input(s): DDIMER in the last 72 hours. Hgb A1c: No results for input(s): HGBA1C in the last 72 hours. Lipid Profile: No results for input(s): CHOL, HDL, LDLCALC, TRIG, CHOLHDL, LDLDIRECT in the last 72 hours. Thyroid function studies:  Recent Labs  12/04/14 0136  TSH 1.132   Anemia work up: No results for input(s): VITAMINB12, FOLATE, FERRITIN, TIBC, IRON, RETICCTPCT in the last 72 hours. Sepsis Labs:  Recent Labs Lab 12/03/14 1613 12/03/14 2036 12/03/14 2150 12/04/14 0136 12/05/14 0353  WBC 5.8  --   --  6.0 5.5  LATICACIDVEN  --  1.54 1.23  --   --    Microbiology No results found for this or any previous visit (from the past 240 hour(s)).   Medications:   . aspirin  300 mg Rectal Daily   Or  . aspirin  325 mg Oral Daily  . aspirin  325 mg Oral Daily  . cefTRIAXone (ROCEPHIN)  IV  1 g Intravenous Q24H  . diltiazem  180 mg Oral Daily  . DULoxetine  30 mg Oral Daily  . DULoxetine  60 mg Oral QPM  . enoxaparin (LOVENOX) injection  40 mg Subcutaneous Q24H  . hydrochlorothiazide  25 mg Oral Daily  . hydroxychloroquine  200 mg Oral Daily  . loratadine  10 mg Oral Daily  . lubiprostone  24 mcg Oral BID WC  . memantine  28 mg Oral Daily  . metoprolol  50 mg Oral BID  . pantoprazole  40 mg Oral Daily  . pilocarpine  5 mg Oral BID  . polyethylene glycol  17 g Oral Daily  . potassium chloride  40 mEq Oral BID  . pravastatin   20 mg Oral q1800  . pregabalin  75 mg Oral BID   Continuous Infusions:   Time spent: 25 min   LOS: 2 days   Charlynne Cousins  [facility] Pager [phone]  *Please refer to [email or web address], password [email or web address] to get updated schedule on who will round on this patient, as hospitalists switch teams weekly. If 7PM-7AM, please contact night-coverage at [email or web address], password [email or web address] for any overnight needs.  12/05/2014, 10:27 AM

## 2014-12-05 NOTE — Progress Notes (Signed)
Tap water enema ordered for Pt. She has had 1 small stool. Pt confused at this time. Will defer to a later time.

## 2014-12-05 NOTE — Evaluation (Signed)
Physical Therapy Evaluation Patient Details Name: Ariana [name] MRN: [id number] DOB: 06/14/1929 Today's Date: 12/05/2014   History of Present Illness  Pt is a 81 y/o F admitted s/p fall and confusion, CT unremarkable.  Pt dehydrated and UTI upon admission.  Pt's PMH includes urinary incontinence, low back pain, fibromyalgia, memory loss, anxiety and depression, peripheral neuropathy Bil LEs, SOB, anginal pain, dementia, headache, CVD.  Clinical Impression  Pt admitted with above diagnosis. Pt currently with functional limitations due to the deficits listed below (see PT Problem List). Pt continues to be confused and is not safe to return home at this time.  Will need 24/7 supervision/assist and will need SNF at d/c.  She requires min assist for safety w/ sit<>stand and ambulation. Pt will benefit from skilled PT to increase their independence and safety with mobility to allow discharge to the venue listed below.      Follow Up Recommendations SNF;Supervision/Assistance - 24 hour    Equipment Recommendations  None recommended by PT    Recommendations for Other Services       Precautions / Restrictions Precautions Precautions: Fall Precaution Comments: h/o memory loss and confusion Restrictions Weight Bearing Restrictions: No      Mobility  Bed Mobility Overal bed mobility: Needs Assistance Bed Mobility: Supine to Sit     Supine to sit: Min assist;HOB elevated     General bed mobility comments: Min assist to scoot to EOB w/ use of bed rail and HOB elevated.  Transfers Overall transfer level: Needs assistance Equipment used: Rolling walker (2 wheeled) Transfers: Sit to/from Stand Sit to Stand: Min assist;+2 safety/equipment         General transfer comment: Min assist to ensure stability while providing verbal and tactile cues for proper hand placement.    Ambulation/Gait Ambulation/Gait assistance: Min assist;+2 safety/equipment Ambulation Distance (Feet): 80  Feet Assistive device: Rolling walker (2 wheeled) Gait Pattern/deviations: Step-through pattern;Decreased dorsiflexion - left;Decreased stride length;Antalgic;Drifts right/left   Gait velocity interpretation: <1.8 ft/sec, indicative of risk for recurrent falls General Gait Details: Drifts Rt and dec DF on Lt LE.  Becomes SOB after ambulating 80 ft (SpO2 99% on RA).  Dec foot clearnace Bil during ambulation.  Stairs            Wheelchair Mobility    Modified Rankin (Stroke Patients Only)       Balance Overall balance assessment: Needs assistance;History of Falls Sitting-balance support: Bilateral upper extremity supported;Feet supported Sitting balance-Leahy Scale: Fair     Standing balance support: Bilateral upper extremity supported;During functional activity Standing balance-Leahy Scale: Poor Standing balance comment: Requires RW and min assist at times for support                             Pertinent Vitals/Pain Pain Assessment: No/denies pain    Home Living Family/patient expects to be discharged to:: Skilled nursing facility Living Arrangements: Alone Available Help at Discharge: Personal care attendant;Available PRN/intermittently Type of Home: Other(Comment) ([facility]home) Home Access: Level entry     Home Layout: One level Home Equipment: Walker - 4 wheels;Cane - single point;Grab bars - tub/shower;Bedside commode;Shower seat;Cane - quad (rollator)      Prior Function Level of Independence: Needs assistance   Gait / Transfers Assistance Needed: Pt uses both cane (SPC) and rollator but mojority of the time uses her rollator, unclear when she uses her cane  ADL's / Homemaking Assistance Needed: Pt reports having aide 9a-5p M-F to  A with meals, housework and tub transfers        Hand Dominance   Dominant Hand: Right    Extremity/Trunk Assessment   Upper Extremity Assessment: Generalized weakness           Lower Extremity Assessment:  LLE deficits/detail;RLE deficits/detail   LLE Deficits / Details: h/o CVA w/ grossly 4/5 strength throughout     Communication   Communication: No difficulties  Cognition Arousal/Alertness: Awake/alert Behavior During Therapy: Impulsive Overall Cognitive Status: Impaired/Different from baseline Area of Impairment: Orientation;Attention;Memory;Safety/judgement;Awareness;Problem solving Orientation Level: Disoriented to;Time (initially says it is 1916 instead of 2016) Current Attention Level: Sustained Memory: Decreased short-term memory   Safety/Judgement: Decreased awareness of safety;Decreased awareness of deficits Awareness: Emergent Problem Solving: Slow processing;Difficulty sequencing;Requires tactile cues General Comments: Pt's conversation very tangential and has difficulty focusing on question being asked.    General Comments General comments (skin integrity, edema, etc.): Pt continues to be confused and is not safe to return home at this time.  Will need 24/7 supervision/assist and will need SNF at d/c.    Exercises General Exercises - Lower Extremity Ankle Circles/Pumps: AROM;Both;10 reps;Seated Quad Sets: Strengthening;Both;10 reps;Seated Straight Leg Raises: AROM;Both;5 reps;Seated      Assessment/Plan    PT Assessment Patient needs continued PT services  PT Diagnosis Difficulty walking;Generalized weakness;Abnormality of gait   PT Problem List Decreased strength;Decreased range of motion;Decreased activity tolerance;Decreased balance;Decreased mobility;Decreased cognition;Decreased knowledge of use of DME;Decreased safety awareness;Decreased knowledge of precautions;Impaired sensation  PT Treatment Interventions DME instruction;Gait training;Stair training;Functional mobility training;Therapeutic activities;Therapeutic exercise;Balance training;Neuromuscular re-education;Cognitive remediation;Patient/family education   PT Goals (Current goals can be found in the  Care Plan section) Acute Rehab PT Goals Patient Stated Goal: to go to rehab then home PT Goal Formulation: With patient Time For Goal Achievement: 12/26/14 Potential to Achieve Goals: Good    Frequency Min 2X/week   Barriers to discharge Decreased caregiver support Pt will not have 24/7 assist at home    Co-evaluation               End of Session Equipment Utilized During Treatment: Gait belt Activity Tolerance: Patient limited by fatigue Patient left: in chair;with call bell/phone within reach;with chair alarm set Nurse Communication: Mobility status;Precautions         Time: 9326-7124 PT Time Calculation (min) (ACUTE ONLY): 32 min   Charges:   PT Evaluation $Initial PT Evaluation Tier I: 1 Procedure PT Treatments $Gait Training: 8-22 mins   PT G CodesJoslyn Hy PT, DPT [phone] Pager: [phone] 12/05/2014, 3:36 PM

## 2014-12-06 LAB — BASIC METABOLIC PANEL
ANION GAP: 11 (ref 5–15)
BUN: 11 mg/dL (ref 6–20)
CHLORIDE: 100 mmol/L — AB (ref 101–111)
CO2: 25 mmol/L (ref 22–32)
Calcium: 9.6 mg/dL (ref 8.9–10.3)
Creatinine, Ser: 0.77 mg/dL (ref 0.44–1.00)
GFR calc non Af Amer: >60 mL/min (ref >60)
Glucose, Bld: 137 mg/dL — ABNORMAL HIGH (ref 65–99)
POTASSIUM: 4.4 mmol/L (ref 3.5–5.1)
SODIUM: 136 mmol/L (ref 135–145)

## 2014-12-06 LAB — URINE CULTURE

## 2014-12-06 MED ORDER — AMPICILLIN 500 MG PO CAPS: 500.0000 mg | ORAL_CAPSULE | Freq: Three times a day (TID) | ORAL | Status: DC

## 2014-12-06 MED ORDER — CEPHALEXIN 500 MG PO CAPS
500.0000 mg | ORAL_CAPSULE | Freq: Four times a day (QID) | ORAL | Status: DC
  Administered 2014-12-06 – 2014-12-07 (×4): 500 mg via ORAL
  Filled 2014-12-06 (×6): qty 1

## 2014-12-06 NOTE — Progress Notes (Signed)
Patient's daughter brought in her home med list and requested a pharmacy tech to come and input the information-- pharmacy called and said they would send someone, patient's home med list placed in her chart

## 2014-12-06 NOTE — Progress Notes (Signed)
[facility] PROGRESS NOTE    Progress Note   Ariana [name] WUJ:[id number] DOB: [birth year] DOA: 12/03/2014 PCP: Ariana Kinnier, DO   Brief Narrative:   Ariana [name] is an 81 y.o. female who came in confused  Assessment/Plan:   Acute encephalopathy possibly due to UTI: - Started empirically on gentle hydration and IV antibiotics, MRI showed no acute infarcts age-related atrophy. - EGGs pending. - Started empirically on admission on IV Rocephin urine cultures grew Escherichia coli sensitive to penicillin will switch to oral Ancef, will monitor overnight for any rashes and allergies. She relates that her allergy to penicillin with a rash - Has remained afebrile no leukocytosis. - Physical therapy evaluation recommended skilled nursing facility, there is some family dynamics with the daughter, and the patient is unsure whether she would want to go to a rehabilitation facility. She will like to think it overnight.  Constipation: Did not get tap water enema on 12/06/2014 I have discussed with the nurse and I am insisting that she should get it today. Cont her on MiraLAX.  Acute kidney injury: Prerenal, resolved with IV hydration.  Hypokalemia: replete orally.  HYPERTENSION, BENIGN Continue current home regimen.  SJOGREN'S SYNDROME  Paroxysmal atrial fibrillation Not an anticoagulation secondary to falls.    DVT Prophylaxis - Lovenox ordered.  Family Communication: non Disposition Plan: Home when stable. Code Status:     Code Status Orders        Start     Ordered   12/04/14 0117  Full code   Continuous     12/04/14 0119    Advance Directive Documentation        Most Recent Value   Type of Advance Directive  Living will   Pre-existing out of facility DNR order (yellow form or pink MOST form)     "MOST" Form in Place?          IV Access:    Peripheral IV   Procedures and diagnostic studies:   No results found.   Medical Consultants:     None.  Anti-Infectives:   Anti-infectives    Start     Dose/Rate Route Frequency Ordered Stop   12/04/14 2100  cefTRIAXone (ROCEPHIN) 1 g in dextrose 5 % 50 mL IVPB     1 g 100 mL/hr over 30 Minutes Intravenous Every 24 hours 12/04/14 0134     12/04/14 1000  hydroxychloroquine (PLAQUENIL) tablet 200 mg     200 mg Oral Daily 12/04/14 0119     12/03/14 2100  cefTRIAXone (ROCEPHIN) 1 g in dextrose 5 % 50 mL IVPB     1 g 100 mL/hr over 30 Minutes Intravenous  Once 12/03/14 2046 12/03/14 2212      Subjective:    Ariana [name] she relates she feels better, when discussing her family history she has labile emotion as there has been some difficult family times with her daughter.  Objective:    Filed Vitals:   12/05/14 0502 12/05/14 1001 12/05/14 2145 12/06/14 0603  BP: 120/47 140/56 121/50 120/50  Pulse: 50 57 61 58  Temp: 97.5 F (36.4 C)  98.2 F (36.8 C) 98.4 F (36.9 C)  TempSrc: Oral  Oral Oral  Resp: 18  18 18   Height:      Weight:      SpO2: 92%  96% 96%    Intake/Output Summary (Last 24 hours) at 12/06/14 1022 Last data filed at 12/06/14 0939  Gross per 24 hour  Intake  290 ml  Output    700 ml  Net   -410 ml   Filed Weights   12/04/14 0100  Weight: 70.3 kg (154 lb 15.7 oz)    Exam: Gen:  NAD Cardiovascular:  RRR, No M/R/G Chest and lungs:   CTAB Abdomen:  Abdomen soft, NT/ND, + BS Extremities:  No C/E/C   Data Reviewed:    Labs: Basic Metabolic Panel:  Recent Labs Lab 12/03/14 1613 12/04/14 0136 12/05/14 0353 12/06/14 0440  NA 135 140 140 136  K 3.9 3.5 3.4* 4.4  CL 96* 101 101 100*  CO2 31 29 30 25   GLUCOSE 118* 124* 113* 137*  BUN 29* 19 12 11   CREATININE 1.12* 0.75 0.62 0.77  CALCIUM 9.7 9.5 9.4 9.6   GFR Estimated Creatinine Clearance: 50.1 mL/min (by C-G formula based on Cr of 0.77). Liver Function Tests:  Recent Labs Lab 12/03/14 1613 12/04/14 0136  AST 34 31  ALT 21 23  ALKPHOS 90 86  BILITOT 0.4 0.8  PROT  7.1 6.8  ALBUMIN 3.8 3.6   No results for input(s): LIPASE, AMYLASE in the last 168 hours.  Recent Labs Lab 12/04/14 0815  AMMONIA 28   Coagulation profile No results for input(s): INR, PROTIME in the last 168 hours.  CBC:  Recent Labs Lab 12/03/14 1613 12/04/14 0136 12/05/14 0353  WBC 5.8 6.0 5.5  HGB 12.1 12.1 11.6*  HCT 36.9 35.4* 34.7*  MCV 91.1 89.8 90.8  PLT 208 189 203   Cardiac Enzymes:  Recent Labs Lab 12/03/14 1613  CKTOTAL 287*   BNP (last 3 results) No results for input(s): PROBNP in the last 8760 hours. CBG: No results for input(s): GLUCAP in the last 168 hours. D-Dimer: No results for input(s): DDIMER in the last 72 hours. Hgb A1c: No results for input(s): HGBA1C in the last 72 hours. Lipid Profile: No results for input(s): CHOL, HDL, LDLCALC, TRIG, CHOLHDL, LDLDIRECT in the last 72 hours. Thyroid function studies:  Recent Labs  12/04/14 0136  TSH 1.132   Anemia work up: No results for input(s): VITAMINB12, FOLATE, FERRITIN, TIBC, IRON, RETICCTPCT in the last 72 hours. Sepsis Labs:  Recent Labs Lab 12/03/14 1613 12/03/14 2036 12/03/14 2150 12/04/14 0136 12/05/14 0353  WBC 5.8  --   --  6.0 5.5  LATICACIDVEN  --  1.54 1.23  --   --    Microbiology Recent Results (from the past 240 hour(s))  Urine culture     Status: None   Collection Time: 12/03/14  7:20 PM  Result Value Ref Range Status   Specimen Description URINE, RANDOM  Final   Special Requests NONE  Final   Culture >=100,000 COLONIES/mL ESCHERICHIA COLI  Final   Report Status 12/06/2014 FINAL  Final   Organism ID, Bacteria ESCHERICHIA COLI  Final      Susceptibility   Escherichia coli - MIC*    AMPICILLIN 8 SENSITIVE Sensitive     CEFAZOLIN <=4 SENSITIVE Sensitive     CEFTRIAXONE <=1 SENSITIVE Sensitive     CIPROFLOXACIN <=0.25 SENSITIVE Sensitive     GENTAMICIN <=1 SENSITIVE Sensitive     IMIPENEM <=0.25 SENSITIVE Sensitive     NITROFURANTOIN <=16 SENSITIVE  Sensitive     TRIMETH/SULFA <=20 SENSITIVE Sensitive     AMPICILLIN/SULBACTAM 4 SENSITIVE Sensitive     PIP/TAZO <=4 SENSITIVE Sensitive     * >=100,000 COLONIES/mL ESCHERICHIA COLI     Medications:   . aspirin  300 mg Rectal Daily  Or  . aspirin  325 mg Oral Daily  . cefTRIAXone (ROCEPHIN)  IV  1 g Intravenous Q24H  . diltiazem  180 mg Oral Daily  . DULoxetine  30 mg Oral Daily  . DULoxetine  60 mg Oral QPM  . enoxaparin (LOVENOX) injection  40 mg Subcutaneous Q24H  . hydrochlorothiazide  25 mg Oral Daily  . hydroxychloroquine  200 mg Oral Daily  . loratadine  10 mg Oral Daily  . lubiprostone  24 mcg Oral BID WC  . memantine  28 mg Oral Daily  . metoprolol  50 mg Oral BID  . pantoprazole  40 mg Oral Daily  . pilocarpine  5 mg Oral BID  . polyethylene glycol  17 g Oral Daily  . pravastatin  20 mg Oral q1800  . pregabalin  75 mg Oral BID   Continuous Infusions:   Time spent: 25 min   LOS: 3 days   Charlynne Cousins  [facility] Pager [phone]  *Please refer to [email or web address], password [email or web address] to get updated schedule on who will round on this patient, as hospitalists switch teams weekly. If 7PM-7AM, please contact night-coverage at [email or web address], password [email or web address] for any overnight needs.  12/06/2014, 10:22 AM

## 2014-12-06 NOTE — Progress Notes (Signed)
Pt refused her vital signs to be checked, will continue to monitor

## 2014-12-06 NOTE — Progress Notes (Signed)
Tap water enema completed earlier, no results at this time. Will continue to monitor.

## 2014-12-07 ENCOUNTER — Inpatient Hospital Stay (HOSPITAL_COMMUNITY): Payer: Medicare Other

## 2014-12-07 DIAGNOSIS — G9340 Encephalopathy, unspecified: Secondary | ICD-10-CM

## 2014-12-07 LAB — HEPATIC FUNCTION PANEL
ALT: 30 U/L (ref 14–54)
AST: 39 U/L (ref 15–41)
Albumin: 3.7 g/dL (ref 3.5–5.0)
Alkaline Phosphatase: 88 U/L (ref 38–126)
BILIRUBIN TOTAL: 0.7 mg/dL (ref 0.3–1.2)
Total Protein: 7 g/dL (ref 6.5–8.1)

## 2014-12-07 LAB — BASIC METABOLIC PANEL
ANION GAP: 10 (ref 5–15)
BUN: 8 mg/dL (ref 6–20)
CALCIUM: 9.5 mg/dL (ref 8.9–10.3)
CHLORIDE: 97 mmol/L — AB (ref 101–111)
CO2: 28 mmol/L (ref 22–32)
Creatinine, Ser: 0.67 mg/dL (ref 0.44–1.00)
GFR calc non Af Amer: >60 mL/min (ref >60)
Glucose, Bld: 121 mg/dL — ABNORMAL HIGH (ref 65–99)
POTASSIUM: 3.6 mmol/L (ref 3.5–5.1)
Sodium: 135 mmol/L (ref 135–145)

## 2014-12-07 LAB — LIPASE, BLOOD: LIPASE: 36 U/L (ref 11–51)

## 2014-12-07 MED ORDER — ALPRAZOLAM 0.5 MG PO TABS: 0.5000 mg | ORAL_TABLET | Freq: Two times a day (BID) | ORAL | Status: DC | PRN

## 2014-12-07 MED ORDER — IOHEXOL 300 MG/ML  SOLN
25.0000 mL | INTRAMUSCULAR | Status: AC
  Administered 2014-12-07 (×2): 25 mL via ORAL

## 2014-12-07 MED ORDER — CEPHALEXIN 500 MG PO CAPS
500.0000 mg | ORAL_CAPSULE | Freq: Three times a day (TID) | ORAL | Status: DC
  Administered 2014-12-07 – 2014-12-08 (×3): 500 mg via ORAL
  Filled 2014-12-07 (×4): qty 1

## 2014-12-07 MED ORDER — CEPHALEXIN 500 MG PO CAPS: 500.0000 mg | ORAL_CAPSULE | Freq: Three times a day (TID) | ORAL | Status: AC

## 2014-12-07 MED ORDER — SODIUM CHLORIDE 0.9 % IV SOLN
INTRAVENOUS | Status: DC
Start: 2014-12-07 — End: 2014-12-07
  Administered 2014-12-07: 13:00:00 via INTRAVENOUS

## 2014-12-07 MED ORDER — IOHEXOL 300 MG/ML  SOLN
100.0000 mL | Freq: Once | INTRAMUSCULAR | Status: AC | PRN
  Administered 2014-12-07: 100 mL via INTRAVENOUS

## 2014-12-07 MED ORDER — HYDROCODONE-ACETAMINOPHEN 5-325 MG PO TABS: 1.0000 | ORAL_TABLET | Freq: Four times a day (QID) | ORAL | Status: DC | PRN

## 2014-12-07 NOTE — Discharge Summary (Signed)
Physician Discharge Summary  Ariana [name] MRN: [id number] DOB/AGE: May 18, 1929 81 y.o.  PCP: Hollace Kinnier, DO   Admit date: 12/03/2014 Discharge date: 12/07/2014  Discharge Diagnoses:     Principal Problem:   Acute encephalopathy Active Problems:   HYPERTENSION, BENIGN   SJOGREN'S SYNDROME   UTI (lower urinary tract infection)   AKI (acute kidney injury) ([place])   Fall    Follow-up recommendations Follow-up with PCP in 3-5 days , including all  additional recommended appointments as below Follow-up CBC, CMP in 3-5 days      Medication List    TAKE these medications        ALPRAZolam 0.5 MG tablet  Commonly known as:  XANAX  Take 1 tablet (0.5 mg total) by mouth 2 (two) times daily as needed for anxiety.     aspirin 325 MG tablet  Take 1 tablet (325 mg total) by mouth daily.     bisacodyl 5 MG EC tablet  Commonly known as:  bisacodyl  Take 2 tablets (10 mg total) by mouth daily as needed for moderate constipation. If no effective bowel movement every 2- 3 days     CALCIUM CARBONATE PO  Take 1 tablet by mouth daily.     cephALEXin 500 MG capsule  Commonly known as:  KEFLEX  Take 1 capsule (500 mg total) by mouth every 8 (eight) hours.     cetirizine 10 MG tablet  Commonly known as:  ZYRTEC  Take 1 tablet (10 mg total) by mouth daily.     CRANBERRY SOFT PO  Take 1 capsule by mouth daily.     diltiazem 180 MG 24 hr capsule  Commonly known as:  CARDIZEM CD  Take 180 mg by mouth daily.     DULoxetine 30 MG capsule  Commonly known as:  CYMBALTA  Take one tablet  once daily in the morning for depression     DULoxetine 60 MG capsule  Commonly known as:  CYMBALTA  Take one tablet by mouth in the evening once daily for depression     hydrochlorothiazide 25 MG tablet  Commonly known as:  HYDRODIURIL  Take 1 tablet (25 mg total) by mouth daily.     HYDROcodone-acetaminophen 5-325 MG tablet  Commonly known as:  NORCO/VICODIN  Take 1 tablet by mouth every  6 (six) hours as needed for moderate pain. Take one tablet twice daily for pain     hydroxychloroquine 200 MG tablet  Commonly known as:  PLAQUENIL  Take 200 mg by mouth daily.     lactose free nutrition Liqd  Take 237 mLs by mouth 2 (two) times daily between meals.     lansoprazole 30 MG capsule  Commonly known as:  PREVACID  TAKE ONE CAPSULE BY MOUTH EVERY DAY AT 12 NOON     lubiprostone 24 MCG capsule  Commonly known as:  AMITIZA  Take 1 capsule (24 mcg total) by mouth 2 (two) times daily with a meal.     LYRICA 75 MG capsule  Generic drug:  pregabalin  TAKE ONE CAPSULE BY MOUTH TWICE A DAY     memantine 28 MG Cp24 24 hr capsule  Commonly known as:  NAMENDA XR  Take one capsule by mouth once daily to preserve memory     metoprolol 50 MG tablet  Commonly known as:  LOPRESSOR  Take 1 tablet (50 mg total) by mouth 2 (two) times daily.     nystatin 100000 UNIT/ML suspension  Commonly known as:  MYCOSTATIN  TAKE 1 TEASPOONFUL BY MOUTH 4 TIMES A DAY as needed for mouth pain     pilocarpine 5 MG tablet  Commonly known as:  SALAGEN  Take 5 mg by mouth 2 (two) times daily.     pravastatin 20 MG tablet  Commonly known as:  PRAVACHOL  Take 1 tablet (20 mg total) by mouth daily at 6 PM.     REFRESH TEARS 0.5 % Soln  Generic drug:  carboxymethylcellulose  Place 1 drop into both eyes 2 (two) times daily as needed (dry eyes).     sodium chloride 0.65 % Soln nasal spray  Commonly known as:  OCEAN  Place 1 spray into the nose as needed for congestion.     triamcinolone ointment 0.1 %  Commonly known as:  KENALOG  Apply 1 application topically 2 (two) times daily as needed (rash).         Discharge Condition: Stable    Disposition: 01-Home or Self Care   Consults: None   Significant Diagnostic Studies:  Dg Chest 2 View  12/03/2014  CLINICAL DATA:  Fatigue and altered mental status.  Recent fall. EXAM: CHEST  2 VIEW COMPARISON:  05/06/2013. FINDINGS: The heart  is mildly enlarged but stable. There is mild tortuosity and calcification of the thoracic aorta. The lungs demonstrate mild chronic bronchitic and emphysematous changes and left basilar scarring. No infiltrates or effusions. The bony thorax is intact. IMPRESSION: No acute cardiopulmonary findings.  Chronic lung changes Electronically Signed   By: Marijo Sanes M.D.   On: 12/03/2014 19:01   Ct Head Wo Contrast  12/03/2014  CLINICAL DATA:  Recent fall, found lying on ground, initial encounter EXAM: CT HEAD WITHOUT CONTRAST CT CERVICAL SPINE WITHOUT CONTRAST TECHNIQUE: Multidetector CT imaging of the head and cervical spine was performed following the standard protocol without intravenous contrast. Multiplanar CT image reconstructions of the cervical spine were also generated. COMPARISON:  07/26/2014 FINDINGS: CT HEAD FINDINGS Bony calvarium is intact. Diffuse atrophic changes are seen. A chronic right parietal infarct is noted as well as scattered areas of chronic white matter ischemic change. There is also evidence of prior infarct in the right subinsular region. No acute hemorrhage, acute infarction or space-occupying mass lesion are not seen. CT CERVICAL SPINE FINDINGS Seven cervical segments are well visualized. Vertebral body height is well maintained. Mild osteophytic changes and facet hypertrophic changes are seen. No findings to suggest acute fracture or acute facet abnormality are noted. Stable left thyroid lesion which is been previously biopsied is noted. No other soft tissue abnormality is seen. The visualized lung apices are within normal limits. IMPRESSION: CT of the head: Chronic areas of previous ischemia.  No acute abnormality is noted. CT of cervical spine: Degenerative change without acute abnormality. Stable left thyroid lesion Electronically Signed   By: Inez Catalina M.D.   On: 12/03/2014 17:00   Ct Cervical Spine Wo Contrast  12/03/2014  CLINICAL DATA:  Recent fall, found lying on ground,  initial encounter EXAM: CT HEAD WITHOUT CONTRAST CT CERVICAL SPINE WITHOUT CONTRAST TECHNIQUE: Multidetector CT imaging of the head and cervical spine was performed following the standard protocol without intravenous contrast. Multiplanar CT image reconstructions of the cervical spine were also generated. COMPARISON:  07/26/2014 FINDINGS: CT HEAD FINDINGS Bony calvarium is intact. Diffuse atrophic changes are seen. A chronic right parietal infarct is noted as well as scattered areas of chronic white matter ischemic change. There is also evidence of prior infarct in the right subinsular  region. No acute hemorrhage, acute infarction or space-occupying mass lesion are not seen. CT CERVICAL SPINE FINDINGS Seven cervical segments are well visualized. Vertebral body height is well maintained. Mild osteophytic changes and facet hypertrophic changes are seen. No findings to suggest acute fracture or acute facet abnormality are noted. Stable left thyroid lesion which is been previously biopsied is noted. No other soft tissue abnormality is seen. The visualized lung apices are within normal limits. IMPRESSION: CT of the head: Chronic areas of previous ischemia.  No acute abnormality is noted. CT of cervical spine: Degenerative change without acute abnormality. Stable left thyroid lesion Electronically Signed   By: Inez Catalina M.D.   On: 12/03/2014 17:00   Mr Brain Wo Contrast  12/04/2014  CLINICAL DATA:  Initial evaluation for acute encephalopathy. EXAM: MRI HEAD WITHOUT CONTRAST TECHNIQUE: Multiplanar, multiecho pulse sequences of the brain and surrounding structures were obtained without intravenous contrast. COMPARISON:  Prior CT from 12/03/2014. FINDINGS: Diffuse prominence of the CSF containing spaces is compatible with age-related cerebral atrophy. Mild T2/FLAIR hyperintensity within the periventricular white matter likely related to chronic small vessel ischemic disease. Focal encephalomalacia within the right  frontal lobe and right parietal lobe present, consistent with remote infarcts. No abnormal foci of restricted diffusion to suggest acute intracranial infarct. Gray-white matter differentiation maintained. Normal intravascular flow voids are preserved. No acute or chronic intracranial hemorrhage. No mass lesion, midline shift, or mass effect. No hydrocephalus. No extra-axial fluid collection. Craniocervical junction within normal limits. Pituitary gland normal. No acute abnormality about the orbits. Sequela prior bilateral lens extraction noted. Mild mucosal thickening within the ethmoidal air cells. Right maxillary sinus is hypoplastic in appearance. No evidence for active sinus infection. No mastoid effusion. Inner ear structures normal. Bone marrow signal intensity within normal limits. No scalp soft tissue abnormality. IMPRESSION: 1. No acute intracranial infarct or other process identified. 2. Age-related cerebral atrophy with mild chronic small vessel ischemic disease and remote right frontal and parietal lobe infarcts. Electronically Signed   By: Jeannine Boga M.D.   On: 12/04/2014 03:01   Dg Pelvis Portable  12/04/2014  CLINICAL DATA:  Fall. EXAM: PORTABLE PELVIS 1-2 VIEWS COMPARISON:  None. FINDINGS: No acute bony or joint abnormality identified. Degenerative changes both hips. No evidence of fracture or dislocation. IMPRESSION: No acute bony or joint abnormality.  Degenerative changes both hips. Electronically Signed   By: Marcello Moores  Register   On: 12/04/2014 08:09   Dg Hand Complete Left  12/03/2014  CLINICAL DATA:  Left hand pain and swelling after fall onto an outstretched hand. Pain about the metacarpal phalangeal joints. EXAM: LEFT HAND - COMPLETE 3+ VIEW COMPARISON:  Radiographs 02/09/2008 FINDINGS: Patient unable to remove rings from the fourth digit. No acute fracture. Progressive osteoarthritis involving the thumb, index and middle finger metacarpal phalangeal joints with increased  joint space narrowing. Additional scattered osteoarthritis throughout the left hand. No dislocation. There is mild dorsal soft tissue edema. No radiopaque foreign body. IMPRESSION: 1. No acute fracture or dislocation. 2. 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 Electronically Signed   By: Jeb Levering M.D.   On: 12/03/2014 19:03       Filed Weights   12/04/14 0100  Weight: 70.3 kg (154 lb 15.7 oz)     Microbiology: Recent Results (from the past 240 hour(s))  Urine culture     Status: None   Collection Time: 12/03/14  7:20 PM  Result Value Ref Range Status   Specimen Description URINE, RANDOM  Final  Special Requests NONE  Final   Culture >=100,000 COLONIES/mL ESCHERICHIA COLI  Final   Report Status 12/06/2014 FINAL  Final   Organism ID, Bacteria ESCHERICHIA COLI  Final      Susceptibility   Escherichia coli - MIC*    AMPICILLIN 8 SENSITIVE Sensitive     CEFAZOLIN <=4 SENSITIVE Sensitive     CEFTRIAXONE <=1 SENSITIVE Sensitive     CIPROFLOXACIN <=0.25 SENSITIVE Sensitive     GENTAMICIN <=1 SENSITIVE Sensitive     IMIPENEM <=0.25 SENSITIVE Sensitive     NITROFURANTOIN <=16 SENSITIVE Sensitive     TRIMETH/SULFA <=20 SENSITIVE Sensitive     AMPICILLIN/SULBACTAM 4 SENSITIVE Sensitive     PIP/TAZO <=4 SENSITIVE Sensitive     * >=100,000 COLONIES/mL ESCHERICHIA COLI       Blood Culture    Component Value Date/Time   SDES URINE, RANDOM 12/03/2014 1920   SPECREQUEST NONE 12/03/2014 1920   CULT >=100,000 COLONIES/mL ESCHERICHIA COLI 12/03/2014 1920   REPTSTATUS 12/06/2014 FINAL 12/03/2014 1920      Labs: Results for orders placed or performed during the hospital encounter of 12/03/14 (from the past 48 hour(s))  Basic metabolic panel     Status: Abnormal   Collection Time: 12/06/14  4:40 AM  Result Value Ref Range   Sodium 136 135 - 145 mmol/L   Potassium 4.4 3.5 - 5.1 mmol/L    Comment: DELTA CHECK NOTED   Chloride 100 (L) 101 - 111 mmol/L    CO2 25 22 - 32 mmol/L   Glucose, Bld 137 (H) 65 - 99 mg/dL   BUN 11 6 - 20 mg/dL   Creatinine, Ser 0.77 0.44 - 1.00 mg/dL   Calcium 9.6 8.9 - 10.3 mg/dL   GFR calc non Af Amer >60 >60 mL/min   GFR calc Af Amer >60 >60 mL/min    Comment: (NOTE) The eGFR has been calculated using the CKD EPI equation. This calculation has not been validated in all clinical situations. eGFR's persistently <60 mL/min signify possible Chronic Kidney Disease.    Anion gap 11 5 - 15  Basic metabolic panel     Status: Abnormal   Collection Time: 12/07/14  4:53 AM  Result Value Ref Range   Sodium 135 135 - 145 mmol/L   Potassium 3.6 3.5 - 5.1 mmol/L    Comment: DELTA CHECK NOTED   Chloride 97 (L) 101 - 111 mmol/L   CO2 28 22 - 32 mmol/L   Glucose, Bld 121 (H) 65 - 99 mg/dL   BUN 8 6 - 20 mg/dL   Creatinine, Ser 0.67 0.44 - 1.00 mg/dL   Calcium 9.5 8.9 - 10.3 mg/dL   GFR calc non Af Amer >60 >60 mL/min   GFR calc Af Amer >60 >60 mL/min    Comment: (NOTE) The eGFR has been calculated using the CKD EPI equation. This calculation has not been validated in all clinical situations. eGFR's persistently <60 mL/min signify possible Chronic Kidney Disease.    Anion gap 10 5 - 15     Lipid Panel     Component Value Date/Time   CHOL 157 07/27/2014 0332   TRIG 58 07/27/2014 0332   HDL 48 07/27/2014 0332   CHOLHDL 3.3 07/27/2014 0332   VLDL 12 07/27/2014 0332   LDLCALC 97 07/27/2014 0332   LDLDIRECT 65.7 07/08/2010 1335     Lab Results  Component Value Date   HGBA1C 6.3* 07/27/2014   HGBA1C 5.7 05/16/2012   HGBA1C 5.7* 07/18/2011  Lab Results  Component Value Date   LDLCALC 97 07/27/2014   CREATININE 0.67 12/07/2014     HPI :MKAYLA STEELE is a 81 y.o. female with history of paroxysmal atrial fibrillation not on anticoagulants secondary to falls and has had a stroke in June of this year was brought to the ER the patient had a fall at home. As per patient daughter who provided the history  patient's home health nurse was tried again drowsy yesterday but since patient was not opening the door patient's son-in-law went and opened the door and patient was found to be on the floor. Patient was not unconscious but does not recall falling. Patient's family tried to get into PCP office but was unable to make it because of transport issues. Today patient also had difficulty getting out of the commode. In addition patient is also found to be mildly confused. CT head and neck were unremarkable for anything acute. On exam patient has mild confusion periodically. Labs reveal acute renal failure and UTI. EKG shows sinus bradycardia. Patient had failed swallow. Patient is admitted for further management.   HOSPITAL COURSE: * Acute encephalopathy possibly due to UTI: - Started empirically on gentle hydration and IV antibiotics, MRI showed no acute infarcts age-related atrophy. - Started empirically on admission on IV Rocephin urine cultures grew Escherichia coli sensitive to penicillin switched to oral Ancef, patient was monitored overnight for any rashes and allergies. She relates that her allergy to penicillin with a rash - Has remained afebrile no leukocytosis. - Physical therapy evaluation recommended home health PT on 10/24,   Constipation: Chronic abdominal pain especially in the right lower quadrant Patient was found to be constipated during this admission, she did have a large BM on the day of discharge, patient received tap water enema on 12/06/2014 I have discussed with the nurse and I am insisting that she should get it today. Cont her on MiraLAX. Based on patient request a CT abdomen pelvis has been ordered to further evaluate her abdominal pain if negative the patient will discharge home today as the pain is likely secondary to a combination of urinary tract infection and constipation  Acute kidney injury: Prerenal, resolved with IV hydration.  Hypokalemia: replete  orally.  HYPERTENSION, BENIGN Continue current home regimen.  SJOGREN'S SYNDROME  Paroxysmal atrial fibrillation Not an anticoagulation secondary to falls.  Discharge Exam:    Blood pressure 109/56, pulse 55, temperature 97.5 F (36.4 C), temperature source Oral, resp. rate 18, height _0  (1.6 m), weight 70.3 kg (154 lb 15.7 oz), SpO2 100 %. Gen: NAD Cardiovascular: RRR, No M/R/G Chest and lungs: CTAB Abdomen: Abdomen soft, NT/ND, + BS Extremities: No C/E/C        Discharge Instructions    Diet - low sodium heart healthy    Complete by:  As directed      Diet - low sodium heart healthy    Complete by:  As directed      Increase activity slowly    Complete by:  As directed      Increase activity slowly    Complete by:  As directed            Follow-up Information    Follow up with [facility]   Why:  Resume home health you had prior to admission.   Contact information:   [address] [address] [place] [address] [phone]       Signed: Reyne Dumas 12/07/2014, 12:49 PM  Time spent >45 mins   

## 2014-12-07 NOTE — Progress Notes (Signed)
Occupational Therapy Evaluation Patient Details Name: Ariana [name] MRN: [id number] DOB: [birth year] Today's Date: 12/07/2014    History of Present Illness Pt is a 81 y/o F admitted s/p fall and confusion, CT unremarkable.  Pt dehydrated and UTI upon admission.  Pt's PMH includes urinary incontinence, low back pain, fibromyalgia, memory loss, anxiety and depression, peripheral neuropathy Bil LEs, SOB, anginal pain, dementia, headache, CVD.   Clinical Impression   PTA, pt lived at home and had PCA several days/week. Pt appears close to baseline level of functioning with ADL and functional mobility @ RW level. Pt discussing frustration with level of care by private caregivers. Also discussing her relationship with her daughter. Recommend pt use tub bench for bathing rather then stepping over tub, which is how last reported fall occurred per pt. Recommend HHOT.     Follow Up Recommendations  Home health OT;Supervision - Intermittent    Equipment Recommendations  Tub/shower bench    Recommendations for Other Services       Precautions / Restrictions Precautions Precautions: Fall Restrictions Weight Bearing Restrictions: No      Mobility Bed Mobility Overal bed mobility: Modified Independent                Transfers Overall transfer level: Modified independent                    Balance     Sitting balance-Leahy Scale: Good       Standing balance-Leahy Scale: Fair                              ADL Overall ADL's : Needs assistance/impaired     Grooming: Standing;Modified independent   Upper Body Bathing: Modified independent;Standing   Lower Body Bathing: Supervison/ safety;Sit to/from stand   Upper Body Dressing : Modified independent   Lower Body Dressing: Supervision/safety;Sit to/from stand   Toilet Transfer: RW;BSC;Modified Independent (over toilet)   Toileting- Clothing Manipulation and Hygiene: Sit to/from stand;Modified  independent       Functional mobility during ADLs: Min guard;Rolling walker General ADL Comments: Pt did well with mobility for ADL. Good balance during pericare. good safety awreness.     Vision     Perception     Praxis      Pertinent Vitals/Pain Pain Assessment: 0-10 Pain Score: 2  Pain Location: headache Pain Descriptors / Indicators: Aching Pain Intervention(s): Limited activity within patient's tolerance;Patient requesting pain meds-RN notified     Hand Dominance Right   Extremity/Trunk Assessment Upper Extremity Assessment Upper Extremity Assessment: Overall WFL for tasks assessed   Lower Extremity Assessment Lower Extremity Assessment: Defer to PT evaluation RLE Sensation: history of peripheral neuropathy LLE Deficits / Details: h/o CVA w/ grossly 4/5 strength throughout LLE Sensation: history of peripheral neuropathy   Cervical / Trunk Assessment Cervical / Trunk Assessment: Normal   Communication Communication Communication: No difficulties   Cognition Arousal/Alertness: Awake/alert   Overall Cognitive Status: No family/caregiver present to determine baseline cognitive functioning Area of Impairment: Memory Orientation Level:  (initially says it is 1916 instead of 2016)   Memory: Decreased short-term memory     Awareness: Emergent Problem Solving: Slow processing General Comments: Pt cognition apparently improved since PT eval   General Comments       Exercises       Shoulder Instructions      Home Living Family/patient expects to be discharged to:: Private residence Living Arrangements: Alone Available  Help at Discharge: Personal care attendant;Available PRN/intermittently Type of Home: Other(Comment) (Townhome) Home Access: Level entry     Home Layout: One level     Bathroom Shower/Tub: Teacher, early years/pre: Standard Bathroom Accessibility: Yes How Accessible: Accessible via walker Home Equipment: [place] - 4  wheels;Cane - single point;Grab bars - tub/shower;Bedside commode;Shower seat;Cane - quad (rollator)          Prior Functioning/Environment Level of Independence: Needs assistance  Gait / Transfers Assistance Needed: Pt uses both cane (SPC) and rollator but mojority of the time uses her rollator, unclear when she uses her cane ADL's / Homemaking Assistance Needed: Pt reports having aide 9a-5p M-F to A with meals, housework and tub transfers        OT Diagnosis: Generalized weakness   OT Problem List: Decreased strength;Decreased activity tolerance;Impaired balance (sitting and/or standing);Impaired sensation   OT Treatment/Interventions: Self-care/ADL training;Therapeutic exercise;DME and/or AE instruction;Therapeutic activities;Patient/family education;Balance training    OT Goals(Current goals can be found in the care plan section) Acute Rehab OT Goals Patient Stated Goal: to go home OT Goal Formulation: With patient Time For Goal Achievement: 12/14/14 Potential to Achieve Goals: Good  OT Frequency: Min 2X/week   Barriers to D/C: Decreased caregiver support          Co-evaluation              End of Session Equipment Utilized During Treatment: Rolling walker;Gait belt Nurse Communication: Mobility status  Activity Tolerance: Patient tolerated treatment well Patient left: in chair;with call bell/phone within reach;with chair alarm set   Time: 2423-5361 OT Time Calculation (min): 33 min Charges:  OT General Charges $OT Visit: 1 Procedure OT Evaluation $Initial OT Evaluation Tier I: 1 Procedure OT Treatments $Self Care/Home Management : 8-22 mins G-Codes:    Skylar Flynt,HILLARY 2015/01/02, 11:04 AM   Maurie Boettcher, OTR/L  [phone] 2015-01-02

## 2014-12-07 NOTE — Care Management Important Message (Signed)
Important Message  Patient Details  Name: Ariana [name] MRN: [id number] Date of Birth: 03/09/1929   Medicare Important Message Given:  Yes-second notification given    Nathen May 12/07/2014, 10:48 AM

## 2014-12-07 NOTE — Progress Notes (Signed)
Physical Therapy Treatment Patient Details Name: Ariana [name] MRN: [id number] DOB: 01/06/1930 Today's Date: 12/07/2014    History of Present Illness Pt is a 81 y/o F admitted s/p fall and confusion, CT unremarkable.  Pt dehydrated and UTI upon admission.  Pt's PMH includes urinary incontinence, low back pain, fibromyalgia, memory loss, anxiety and depression, peripheral neuropathy Bil LEs, SOB, anginal pain, dementia, headache, CVD.    PT Comments    Pt with much improved mobility since eval. Can return home with [facility] from PT standpoint.  Follow Up Recommendations  Home health PT;Supervision - Intermittent     Equipment Recommendations  None recommended by PT    Recommendations for Other Services       Precautions / Restrictions Precautions Precautions: Fall Restrictions Weight Bearing Restrictions: No    Mobility  Bed Mobility Overal bed mobility: Modified Independent             General bed mobility comments: Pt up in chair.  Transfers Overall transfer level: Modified independent Equipment used: 4-wheeled walker Transfers: Sit to/from Stand Sit to Stand: Modified independent (Device/Increase time)            Ambulation/Gait Ambulation/Gait assistance: Modified independent (Device/Increase time) Ambulation Distance (Feet): 225 Feet Assistive device: 4-wheeled walker Gait Pattern/deviations: Step-through pattern;Decreased stride length     General Gait Details: Steady gait with rollator   Stairs            Wheelchair Mobility    Modified Rankin (Stroke Patients Only)       Balance     Sitting balance-Leahy Scale: Good       Standing balance-Leahy Scale: Fair                      Cognition Arousal/Alertness: Awake/alert Behavior During Therapy: WFL for tasks assessed/performed Overall Cognitive Status: No family/caregiver present to determine baseline cognitive functioning Area of Impairment: Memory Orientation Level:   (initially says it is 1916 instead of 2016)   Memory: Decreased short-term memory     Awareness: Emergent Problem Solving: Slow processing General Comments: Cognition apparently improved since eval.    Exercises      General Comments        Pertinent Vitals/Pain Pain Assessment: 0-10 Pain Score: 2  Pain Location: headache Pain Descriptors / Indicators: Aching Pain Intervention(s): Limited activity within patient's tolerance;Patient requesting pain meds-RN notified    Home Living Family/patient expects to be discharged to:: Private residence Living Arrangements: Alone Available Help at Discharge: Personal care attendant;Available PRN/intermittently Type of Home: Other(Comment) (Townhome) Home Access: Level entry   Home Layout: One level Home Equipment: Walker - 4 wheels;Cane - single point;Grab bars - tub/shower;Bedside commode;Shower seat;Cane - quad (rollator)      Prior Function Level of Independence: Needs assistance  Gait / Transfers Assistance Needed: Pt uses both cane (SPC) and rollator but mojority of the time uses her rollator, unclear when she uses her cane ADL's / Homemaking Assistance Needed: Pt reports having aide 9a-5p M-F to A with meals, housework and tub transfers     PT Goals (current goals can now be found in the care plan section) Acute Rehab PT Goals Patient Stated Goal: to go home Progress towards PT goals: Goals met/education completed, patient discharged from PT (further PT at home)    Frequency       PT Plan Discharge plan needs to be updated    Co-evaluation  End of Session   Activity Tolerance: Patient tolerated treatment well Patient left: in chair;with call bell/phone within reach;with chair alarm set     Time: 1030-1043 PT Time Calculation (min) (ACUTE ONLY): 13 min  Charges:  $Gait Training: 8-22 mins                    G Codes:      Ariana [name] Ariana [name], Ariana [name] Geremiah Fussell PT [phone]

## 2014-12-07 NOTE — Progress Notes (Signed)
Pt c/o RLQ pain. Made Dr Allyson Sabal aware. Pain medication given this am with no relief. Pt had a large BM this am.

## 2014-12-07 NOTE — Clinical Social Work Note (Signed)
BSW Intern met with patient about possible SNF placement options. BSW Intern also stated that patient was ready for discharge today. Once BSW exited room, PT talked with BSW Intern and stated that patient did well in therapy this morning and would be returning home with home health. BSW Intern returned to patient room to let patient know that [place] Intern had a miscommunication and that the patient would be returning home. Patient was confused and agitated that she would be leaving because she still had pain in her side.   BSW Intern then called daughter to update give an update on patient status. BSW Intern stated that patient was ready for discharge today and patient would be returning home with home health. BSW Intern stated to patient daughter that patient was confused as to why she would be returning home. Patient's daughter was understanding and told BSW intern that she and her husband would be able to pick up patient later this evening (around 5-6pm) when her husband returned home from work. Patient daughter raised concern that BSW Intern was the first person she had spoke with since her mother had been in the hospital.   Per MD, patient is ready to discharge back home. RN and Patient/family have been notified of patient discharge. BSW Intern signing off.   Raynelle Highland BSW Intern, [phone]

## 2014-12-07 NOTE — Care Management Note (Addendum)
Case Management Note  Patient Details  Name: EMILEA GOGA MRN: [id number] Date of Birth: Jul 31, 1929  Subjective/Objective:                 Patient [facility], and [facility] per patient. Patient has acute encephalopathy and abdominal pain.  Will continue to follow PT recommendations.    Action/Plan:  Patient to DC to home with [facility] through [place], resumption order placed and referral made to [facility].  Expected Discharge Date:                  Expected Discharge Plan:  [place]  In-House Referral:     Discharge planning Services  CM Consult  Post Acute Care Choice:    Choice offered to:  Patient  DME Arranged:    DME Agency:     HH Arranged:    [place]:  [facility] (patient active prior to admission)  Status of Service:  In process, will continue to follow  Medicare Important Message Given:  Yes-second notification given Date Medicare IM Given:    Medicare IM give by:    Date Additional Medicare IM Given:    Additional Medicare Important Message give by:     If discussed at [place] of Stay Meetings, dates discussed:    Additional Comments:  Carles Collet, RN 12/07/2014, 11:16 AM

## 2014-12-08 LAB — COMPREHENSIVE METABOLIC PANEL
ALBUMIN: 3.5 g/dL (ref 3.5–5.0)
ALT: 29 U/L (ref 14–54)
AST: 32 U/L (ref 15–41)
Alkaline Phosphatase: 86 U/L (ref 38–126)
Anion gap: 6 (ref 5–15)
BILIRUBIN TOTAL: 0.6 mg/dL (ref 0.3–1.2)
BUN: 7 mg/dL (ref 6–20)
CHLORIDE: 94 mmol/L — AB (ref 101–111)
CO2: 31 mmol/L (ref 22–32)
Calcium: 9.3 mg/dL (ref 8.9–10.3)
Creatinine, Ser: 0.68 mg/dL (ref 0.44–1.00)
GFR calc Af Amer: >60 mL/min (ref >60)
GFR calc non Af Amer: >60 mL/min (ref >60)
GLUCOSE: 113 mg/dL — AB (ref 65–99)
POTASSIUM: 4 mmol/L (ref 3.5–5.1)
Sodium: 131 mmol/L — ABNORMAL LOW (ref 135–145)
Total Protein: 6.4 g/dL — ABNORMAL LOW (ref 6.5–8.1)

## 2014-12-08 LAB — CBC
HEMATOCRIT: 35.4 % — AB (ref 36.0–46.0)
Hemoglobin: 11.9 g/dL — ABNORMAL LOW (ref 12.0–15.0)
MCH: 30.1 pg (ref 26.0–34.0)
MCHC: 33.6 g/dL (ref 30.0–36.0)
MCV: 89.4 fL (ref 78.0–100.0)
Platelets: 206 10*3/uL (ref 150–400)
RBC: 3.96 MIL/uL (ref 3.87–5.11)
RDW: 13.1 % (ref 11.5–15.5)
WBC: 4.3 10*3/uL (ref 4.0–10.5)

## 2014-12-08 NOTE — Discharge Summary (Addendum)
Physician Discharge Summary  Ariana [name] MRN: [id number] DOB/AGE: 79-Oct-1931 79 y.o.  PCP: Hollace Kinnier, DO   Admit date: 12/03/2014 Discharge date: 12/08/2014  Discharge Diagnoses:     Principal Problem:   Acute encephalopathy Active Problems:   HYPERTENSION, BENIGN   SJOGREN'S SYNDROME   UTI (lower urinary tract infection)   AKI (acute kidney injury) ([place])   Fall    Follow-up recommendations Follow-up with PCP in 3-5 days , including all  additional recommended appointments as below Follow-up CBC, CMP in 3-5 days      Medication List    STOP taking these medications        hydrochlorothiazide 25 MG tablet  Commonly known as:  HYDRODIURIL      TAKE these medications        ALPRAZolam 0.5 MG tablet  Commonly known as:  XANAX  Take 1 tablet (0.5 mg total) by mouth 2 (two) times daily as needed for anxiety.     aspirin 325 MG tablet  Take 1 tablet (325 mg total) by mouth daily.     bisacodyl 5 MG EC tablet  Commonly known as:  bisacodyl  Take 2 tablets (10 mg total) by mouth daily as needed for moderate constipation. If no effective bowel movement every 2- 3 days     CALCIUM CARBONATE PO  Take 1 tablet by mouth daily.     cephALEXin 500 MG capsule  Commonly known as:  KEFLEX  Take 1 capsule (500 mg total) by mouth every 8 (eight) hours.     cetirizine 10 MG tablet  Commonly known as:  ZYRTEC  Take 1 tablet (10 mg total) by mouth daily.     CRANBERRY SOFT PO  Take 1 capsule by mouth daily.     diltiazem 180 MG 24 hr capsule  Commonly known as:  CARDIZEM CD  Take 180 mg by mouth daily.     DULoxetine 30 MG capsule  Commonly known as:  CYMBALTA  Take one tablet  once daily in the morning for depression     DULoxetine 60 MG capsule  Commonly known as:  CYMBALTA  Take one tablet by mouth in the evening once daily for depression     HYDROcodone-acetaminophen 5-325 MG tablet  Commonly known as:  NORCO/VICODIN  Take 1 tablet by mouth every 6  (six) hours as needed for moderate pain. Take one tablet twice daily for pain     hydroxychloroquine 200 MG tablet  Commonly known as:  PLAQUENIL  Take 200 mg by mouth daily.     lactose free nutrition Liqd  Take 237 mLs by mouth 2 (two) times daily between meals.     lansoprazole 30 MG capsule  Commonly known as:  PREVACID  TAKE ONE CAPSULE BY MOUTH EVERY DAY AT 12 NOON     lubiprostone 24 MCG capsule  Commonly known as:  AMITIZA  Take 1 capsule (24 mcg total) by mouth 2 (two) times daily with a meal.     LYRICA 75 MG capsule  Generic drug:  pregabalin  TAKE ONE CAPSULE BY MOUTH TWICE A DAY     memantine 28 MG Cp24 24 hr capsule  Commonly known as:  NAMENDA XR  Take one capsule by mouth once daily to preserve memory     metoprolol 50 MG tablet  Commonly known as:  LOPRESSOR  Take 1 tablet (50 mg total) by mouth 2 (two) times daily.     nystatin 100000 UNIT/ML suspension  Commonly known  as:  MYCOSTATIN  TAKE 1 TEASPOONFUL BY MOUTH 4 TIMES A DAY as needed for mouth pain     pilocarpine 5 MG tablet  Commonly known as:  SALAGEN  Take 5 mg by mouth 2 (two) times daily.     pravastatin 20 MG tablet  Commonly known as:  PRAVACHOL  Take 1 tablet (20 mg total) by mouth daily at 6 PM.     REFRESH TEARS 0.5 % Soln  Generic drug:  carboxymethylcellulose  Place 1 drop into both eyes 2 (two) times daily as needed (dry eyes).     sodium chloride 0.65 % Soln nasal spray  Commonly known as:  OCEAN  Place 1 spray into the nose as needed for congestion.     triamcinolone ointment 0.1 %  Commonly known as:  KENALOG  Apply 1 application topically 2 (two) times daily as needed (rash).         Discharge Condition: Stable    Disposition: 01-Home or Self Care   Consults: None   Significant Diagnostic Studies:  Dg Chest 2 View  12/03/2014  CLINICAL DATA:  Fatigue and altered mental status.  Recent fall. EXAM: CHEST  2 VIEW COMPARISON:  05/06/2013. FINDINGS: The heart is  mildly enlarged but stable. There is mild tortuosity and calcification of the thoracic aorta. The lungs demonstrate mild chronic bronchitic and emphysematous changes and left basilar scarring. No infiltrates or effusions. The bony thorax is intact. IMPRESSION: No acute cardiopulmonary findings.  Chronic lung changes Electronically Signed   By: Marijo Sanes M.D.   On: 12/03/2014 19:01   Ct Head Wo Contrast  12/03/2014  CLINICAL DATA:  Recent fall, found lying on ground, initial encounter EXAM: CT HEAD WITHOUT CONTRAST CT CERVICAL SPINE WITHOUT CONTRAST TECHNIQUE: Multidetector CT imaging of the head and cervical spine was performed following the standard protocol without intravenous contrast. Multiplanar CT image reconstructions of the cervical spine were also generated. COMPARISON:  07/26/2014 FINDINGS: CT HEAD FINDINGS Bony calvarium is intact. Diffuse atrophic changes are seen. A chronic right parietal infarct is noted as well as scattered areas of chronic white matter ischemic change. There is also evidence of prior infarct in the right subinsular region. No acute hemorrhage, acute infarction or space-occupying mass lesion are not seen. CT CERVICAL SPINE FINDINGS Seven cervical segments are well visualized. Vertebral body height is well maintained. Mild osteophytic changes and facet hypertrophic changes are seen. No findings to suggest acute fracture or acute facet abnormality are noted. Stable left thyroid lesion which is been previously biopsied is noted. No other soft tissue abnormality is seen. The visualized lung apices are within normal limits. IMPRESSION: CT of the head: Chronic areas of previous ischemia.  No acute abnormality is noted. CT of cervical spine: Degenerative change without acute abnormality. Stable left thyroid lesion Electronically Signed   By: Inez Catalina M.D.   On: 12/03/2014 17:00   Ct Cervical Spine Wo Contrast  12/03/2014  CLINICAL DATA:  Recent fall, found lying on ground,  initial encounter EXAM: CT HEAD WITHOUT CONTRAST CT CERVICAL SPINE WITHOUT CONTRAST TECHNIQUE: Multidetector CT imaging of the head and cervical spine was performed following the standard protocol without intravenous contrast. Multiplanar CT image reconstructions of the cervical spine were also generated. COMPARISON:  07/26/2014 FINDINGS: CT HEAD FINDINGS Bony calvarium is intact. Diffuse atrophic changes are seen. A chronic right parietal infarct is noted as well as scattered areas of chronic white matter ischemic change. There is also evidence of prior infarct in the  right subinsular region. No acute hemorrhage, acute infarction or space-occupying mass lesion are not seen. CT CERVICAL SPINE FINDINGS Seven cervical segments are well visualized. Vertebral body height is well maintained. Mild osteophytic changes and facet hypertrophic changes are seen. No findings to suggest acute fracture or acute facet abnormality are noted. Stable left thyroid lesion which is been previously biopsied is noted. No other soft tissue abnormality is seen. The visualized lung apices are within normal limits. IMPRESSION: CT of the head: Chronic areas of previous ischemia.  No acute abnormality is noted. CT of cervical spine: Degenerative change without acute abnormality. Stable left thyroid lesion Electronically Signed   By: Inez Catalina M.D.   On: 12/03/2014 17:00   Mr Brain Wo Contrast  12/04/2014  CLINICAL DATA:  Initial evaluation for acute encephalopathy. EXAM: MRI HEAD WITHOUT CONTRAST TECHNIQUE: Multiplanar, multiecho pulse sequences of the brain and surrounding structures were obtained without intravenous contrast. COMPARISON:  Prior CT from 12/03/2014. FINDINGS: Diffuse prominence of the CSF containing spaces is compatible with age-related cerebral atrophy. Mild T2/FLAIR hyperintensity within the periventricular white matter likely related to chronic small vessel ischemic disease. Focal encephalomalacia within the right  frontal lobe and right parietal lobe present, consistent with remote infarcts. No abnormal foci of restricted diffusion to suggest acute intracranial infarct. Gray-white matter differentiation maintained. Normal intravascular flow voids are preserved. No acute or chronic intracranial hemorrhage. No mass lesion, midline shift, or mass effect. No hydrocephalus. No extra-axial fluid collection. Craniocervical junction within normal limits. Pituitary gland normal. No acute abnormality about the orbits. Sequela prior bilateral lens extraction noted. Mild mucosal thickening within the ethmoidal air cells. Right maxillary sinus is hypoplastic in appearance. No evidence for active sinus infection. No mastoid effusion. Inner ear structures normal. Bone marrow signal intensity within normal limits. No scalp soft tissue abnormality. IMPRESSION: 1. No acute intracranial infarct or other process identified. 2. Age-related cerebral atrophy with mild chronic small vessel ischemic disease and remote right frontal and parietal lobe infarcts. Electronically Signed   By: Jeannine Boga M.D.   On: 12/04/2014 03:01   Ct Abdomen Pelvis W Contrast  12/07/2014  CLINICAL DATA:  Right lower quadrant abdominal pain, lower abdominal swelling EXAM: CT ABDOMEN AND PELVIS WITH CONTRAST TECHNIQUE: Multidetector CT imaging of the abdomen and pelvis was performed using the standard protocol following bolus administration of intravenous contrast. CONTRAST:  158m OMNIPAQUE IOHEXOL 300 MG/ML  SOLN COMPARISON:  07/20/2013 FINDINGS: Lower chest:  Lung bases are essentially clear. Hepatobiliary: Liver is within normal limits. No suspicious/enhancing hepatic lesions. Status post cholecystectomy. No intrahepatic ductal dilatation. Common duct measures 10 mm and smoothly tapers at the ampulla. Pancreas: Within normal limits. Spleen: Within normal limits. Adrenals/Urinary Tract: Adrenal glands are within normal limits. Multiple simple left renal  cysts, measuring up to 10 mm in the left upper pole. 7 mm hyperdense lesion in the posterior interpolar left kidney with possible de-enhancement (74 HUs to 50 HUs), indeterminate. However, this is unchanged in size from 07/20/2013. Right kidney is within normal limits.  No hydronephrosis. Bladder is within normal limits. Stomach/Bowel: Stomach is notable for a tiny hiatal hernia. No evidence bowel obstruction. Prior appendectomy. Colonic diverticulosis, without evidence of diverticulitis. Vascular/Lymphatic: No evidence of abdominal aortic aneurysm. No suspicious abdominopelvic lymphadenopathy. Reproductive: Status post hysterectomy. Bilateral ovaries are within normal limits. Other: No abdominopelvic ascites. Small fat containing left inguinal hernia. Musculoskeletal: Degenerative changes of the visualized thoracolumbar spine. IMPRESSION: No evidence of bowel obstruction.  Prior appendectomy. Colonic diverticulosis, without evidence  of diverticulitis. No CT findings to account for the patient's right lower quadrant abdominal pain. 7 mm hyperdense lesion in the posterior interpolar left kidney with possible de-enhancement, indeterminate, but unchanged in size from 2015. If there is high clinical concern, consider follow-up CT abdomen with/without contrast in 1 year. Electronically Signed   By: Julian Hy M.D.   On: 12/07/2014 17:01   Dg Pelvis Portable  12/04/2014  CLINICAL DATA:  Fall. EXAM: PORTABLE PELVIS 1-2 VIEWS COMPARISON:  None. FINDINGS: No acute bony or joint abnormality identified. Degenerative changes both hips. No evidence of fracture or dislocation. IMPRESSION: No acute bony or joint abnormality.  Degenerative changes both hips. Electronically Signed   By: Marcello Moores  Register   On: 12/04/2014 08:09   Dg Hand Complete Left  12/03/2014  CLINICAL DATA:  Left hand pain and swelling after fall onto an outstretched hand. Pain about the metacarpal phalangeal joints. EXAM: LEFT HAND - COMPLETE 3+  VIEW COMPARISON:  Radiographs 02/09/2008 FINDINGS: Patient unable to remove rings from the fourth digit. No acute fracture. Progressive osteoarthritis involving the thumb, index and middle finger metacarpal phalangeal joints with increased joint space narrowing. Additional scattered osteoarthritis throughout the left hand. No dislocation. There is mild dorsal soft tissue edema. No radiopaque foreign body. IMPRESSION: 1. No acute fracture or dislocation. 2. 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 Electronically Signed   By: Jeb Levering M.D.   On: 12/03/2014 19:03       Filed Weights   12/04/14 0100  Weight: 70.3 kg (154 lb 15.7 oz)     Microbiology: Recent Results (from the past 240 hour(s))  Urine culture     Status: None   Collection Time: 12/03/14  7:20 PM  Result Value Ref Range Status   Specimen Description URINE, RANDOM  Final   Special Requests NONE  Final   Culture >=100,000 COLONIES/mL ESCHERICHIA COLI  Final   Report Status 12/06/2014 FINAL  Final   Organism ID, Bacteria ESCHERICHIA COLI  Final      Susceptibility   Escherichia coli - MIC*    AMPICILLIN 8 SENSITIVE Sensitive     CEFAZOLIN <=4 SENSITIVE Sensitive     CEFTRIAXONE <=1 SENSITIVE Sensitive     CIPROFLOXACIN <=0.25 SENSITIVE Sensitive     GENTAMICIN <=1 SENSITIVE Sensitive     IMIPENEM <=0.25 SENSITIVE Sensitive     NITROFURANTOIN <=16 SENSITIVE Sensitive     TRIMETH/SULFA <=20 SENSITIVE Sensitive     AMPICILLIN/SULBACTAM 4 SENSITIVE Sensitive     PIP/TAZO <=4 SENSITIVE Sensitive     * >=100,000 COLONIES/mL ESCHERICHIA COLI       Blood Culture    Component Value Date/Time   SDES URINE, RANDOM 12/03/2014 1920   SPECREQUEST NONE 12/03/2014 1920   CULT >=100,000 COLONIES/mL ESCHERICHIA COLI 12/03/2014 1920   REPTSTATUS 12/06/2014 FINAL 12/03/2014 1920      Labs: Results for orders placed or performed during the hospital encounter of 12/03/14 (from the past 48 hour(s))   Basic metabolic panel     Status: Abnormal   Collection Time: 12/07/14  4:53 AM  Result Value Ref Range   Sodium 135 135 - 145 mmol/L   Potassium 3.6 3.5 - 5.1 mmol/L    Comment: DELTA CHECK NOTED   Chloride 97 (L) 101 - 111 mmol/L   CO2 28 22 - 32 mmol/L   Glucose, Bld 121 (H) 65 - 99 mg/dL   BUN 8 6 - 20 mg/dL   Creatinine, Ser 0.67 0.44 - 1.00 mg/dL  Calcium 9.5 8.9 - 10.3 mg/dL   GFR calc non Af Amer >60 >60 mL/min   GFR calc Af Amer >60 >60 mL/min    Comment: (NOTE) The eGFR has been calculated using the CKD EPI equation. This calculation has not been validated in all clinical situations. eGFR's persistently <60 mL/min signify possible Chronic Kidney Disease.    Anion gap 10 5 - 15  Lipase, blood     Status: None   Collection Time: 12/07/14  2:09 PM  Result Value Ref Range   Lipase 36 11 - 51 U/L    Comment: Please note change in reference range.  Hepatic function panel     Status: Abnormal   Collection Time: 12/07/14  2:09 PM  Result Value Ref Range   Total Protein 7.0 6.5 - 8.1 g/dL   Albumin 3.7 3.5 - 5.0 g/dL   AST 39 15 - 41 U/L   ALT 30 14 - 54 U/L   Alkaline Phosphatase 88 38 - 126 U/L   Total Bilirubin 0.7 0.3 - 1.2 mg/dL   Bilirubin, Direct <0.1 (L) 0.1 - 0.5 mg/dL   Indirect Bilirubin NOT CALCULATED 0.3 - 0.9 mg/dL  Comprehensive metabolic panel     Status: Abnormal   Collection Time: 12/08/14  6:30 AM  Result Value Ref Range   Sodium 131 (L) 135 - 145 mmol/L   Potassium 4.0 3.5 - 5.1 mmol/L   Chloride 94 (L) 101 - 111 mmol/L   CO2 31 22 - 32 mmol/L   Glucose, Bld 113 (H) 65 - 99 mg/dL   BUN 7 6 - 20 mg/dL   Creatinine, Ser 0.68 0.44 - 1.00 mg/dL   Calcium 9.3 8.9 - 10.3 mg/dL   Total Protein 6.4 (L) 6.5 - 8.1 g/dL   Albumin 3.5 3.5 - 5.0 g/dL   AST 32 15 - 41 U/L   ALT 29 14 - 54 U/L   Alkaline Phosphatase 86 38 - 126 U/L   Total Bilirubin 0.6 0.3 - 1.2 mg/dL   GFR calc non Af Amer >60 >60 mL/min   GFR calc Af Amer >60 >60 mL/min    Comment:  (NOTE) The eGFR has been calculated using the CKD EPI equation. This calculation has not been validated in all clinical situations. eGFR's persistently <60 mL/min signify possible Chronic Kidney Disease.    Anion gap 6 5 - 15  CBC     Status: Abnormal   Collection Time: 12/08/14  6:30 AM  Result Value Ref Range   WBC 4.3 4.0 - 10.5 K/uL   RBC 3.96 3.87 - 5.11 MIL/uL   Hemoglobin 11.9 (L) 12.0 - 15.0 g/dL   HCT 35.4 (L) 36.0 - 46.0 %   MCV 89.4 78.0 - 100.0 fL   MCH 30.1 26.0 - 34.0 pg   MCHC 33.6 30.0 - 36.0 g/dL   RDW 13.1 11.5 - 15.5 %   Platelets 206 150 - 400 K/uL     Lipid Panel     Component Value Date/Time   CHOL 157 07/27/2014 0332   TRIG 58 07/27/2014 0332   HDL 48 07/27/2014 0332   CHOLHDL 3.3 07/27/2014 0332   VLDL 12 07/27/2014 0332   LDLCALC 97 07/27/2014 0332   LDLDIRECT 65.7 07/08/2010 1335     Lab Results  Component Value Date   HGBA1C 6.3* 07/27/2014   HGBA1C 5.7 05/16/2012   HGBA1C 5.7* 07/18/2011     Lab Results  Component Value Date   LDLCALC 97 07/27/2014  CREATININE 0.68 12/08/2014     HPI :Ariana [name] is a 81 y.o. female with history of paroxysmal atrial fibrillation not on anticoagulants secondary to falls and has had a stroke in June of this year was brought to the ER the patient had a fall at home. As per patient daughter who provided the history patient's home health nurse was tried again drowsy yesterday but since patient was not opening the door patient's son-in-law went and opened the door and patient was found to be on the floor. Patient was not unconscious but does not recall falling. Patient's family tried to get into PCP office but was unable to make it because of transport issues. Today patient also had difficulty getting out of the commode. In addition patient is also found to be mildly confused. CT head and neck were unremarkable for anything acute. On exam patient has mild confusion periodically. Labs reveal acute renal  failure and UTI. EKG shows sinus bradycardia. Patient had failed swallow. Patient is admitted for further management.   HOSPITAL COURSE: * Acute encephalopathy possibly due to UTI: - Started empirically on gentle hydration and IV antibiotics, MRI showed no acute infarcts age-related atrophy. - Started empirically on admission on IV Rocephin urine cultures grew Escherichia coli sensitive to penicillin switched to oral keflex, patient was monitored overnight for any rashes and allergies. She relates that her allergy to penicillin with a rash. Did not report any side effects with Keflex - Has remained afebrile no leukocytosis. - Physical therapy evaluation recommended home health PT on 10/24,  Called patient's daughter Ariana [name] , updated her about clinical progress and medication changes She will come and pick up her mother today  Constipation: Chronic abdominal pain especially in the right lower quadrant Patient was found to be constipated during this admission, she did have a large BM on the day of discharge, patient received tap water enema on 12/06/2014 I have discussed with the nurse and I am insisting that she should get it today. Cont her on MiraLAX. Based on patient request a CT abdomen pelvis was performed   to further evaluate her abdominal pain ,this was  negative . Patient's abdominal pain is likely secondary to a combination of urinary tract infection and constipation. This was explained to the patient's daughter  Acute kidney injury: Prerenal, resolved with IV hydration.  Hypokalemia: repleted orally. Hydrochlorothiazide discontinued  Hyponatremia : likely due to HCTZ , this has been stopped   HYPERTENSION, BENIGN Blood pressure soft, HCTZ discontinued, PCP to recheck in the outpatient setting in consider starting alternative antihypertensive medication if it stays greater than 150.  SJOGREN'S SYNDROME  Paroxysmal atrial fibrillation Not an anticoagulation secondary to  falls.  Discharge Exam:    Blood pressure 133/58, pulse 61, temperature 98.2 F (36.8 C), temperature source Oral, resp. rate 18, height '5\' 3"'  (1.6 m), weight 70.3 kg (154 lb 15.7 oz), SpO2 99 %. Gen: NAD Cardiovascular: RRR, No M/R/G Chest and lungs: CTAB Abdomen: Abdomen soft, NT/ND, + BS Extremities: No C/E/C    Discharge Instructions    Diet - low sodium heart healthy    Complete by:  As directed      Diet - low sodium heart healthy    Complete by:  As directed      Increase activity slowly    Complete by:  As directed      Increase activity slowly    Complete by:  As directed  Follow-up Information    Follow up with [facility].   Why:  Resume home health you had prior to admission.   Contact information:   [address] [address] [phone]       Signed: Reyne Dumas 12/08/2014, 8:14 AM        Time spent >45 mins

## 2014-12-08 NOTE — Progress Notes (Signed)
NURSING PROGRESS NOTE  Ariana [name] [id number] Discharge Data: 12/08/2014 4:30 PM Attending Provider: Reyne Dumas, MD [id number], TIFFANY, DO     [name] to be D/C'd Home per MD order.  Discussed with the patient the After Visit Summary and all questions fully answered. All IV's discontinued with no bleeding noted. All belongings returned to patient for patient to take home. Daughter accompanying pt.    Last Vital Signs:  Blood pressure 109/61, pulse 71, temperature 98.4 F (36.9 C), temperature source Oral, resp. rate 18, height 5\' 3"  (1.6 m), weight 70.3 kg (154 lb 15.7 oz), SpO2 98 %.  Discharge Medication List   Medication List    STOP taking these medications        hydrochlorothiazide 25 MG tablet  Commonly known as:  HYDRODIURIL      TAKE these medications        ALPRAZolam 0.5 MG tablet  Commonly known as:  XANAX  Take 1 tablet (0.5 mg total) by mouth 2 (two) times daily as needed for anxiety.     aspirin 325 MG tablet  Take 1 tablet (325 mg total) by mouth daily.     bisacodyl 5 MG EC tablet  Commonly known as:  bisacodyl  Take 2 tablets (10 mg total) by mouth daily as needed for moderate constipation. If no effective bowel movement every 2- 3 days     CALCIUM CARBONATE PO  Take 1 tablet by mouth daily.     cephALEXin 500 MG capsule  Commonly known as:  KEFLEX  Take 1 capsule (500 mg total) by mouth every 8 (eight) hours.     cetirizine 10 MG tablet  Commonly known as:  ZYRTEC  Take 1 tablet (10 mg total) by mouth daily.     CRANBERRY SOFT PO  Take 1 capsule by mouth daily.     diltiazem 180 MG 24 hr capsule  Commonly known as:  CARDIZEM CD  Take 180 mg by mouth daily.     DULoxetine 30 MG capsule  Commonly known as:  CYMBALTA  Take one tablet  once daily in the morning for depression     DULoxetine 60 MG capsule  Commonly known as:  CYMBALTA  Take one tablet by mouth in the evening once daily for depression     HYDROcodone-acetaminophen  5-325 MG tablet  Commonly known as:  NORCO/VICODIN  Take 1 tablet by mouth every 6 (six) hours as needed for moderate pain. Take one tablet twice daily for pain     hydroxychloroquine 200 MG tablet  Commonly known as:  PLAQUENIL  Take 200 mg by mouth daily.     lactose free nutrition Liqd  Take 237 mLs by mouth 2 (two) times daily between meals.     lansoprazole 30 MG capsule  Commonly known as:  PREVACID  TAKE ONE CAPSULE BY MOUTH EVERY DAY AT 12 NOON     lubiprostone 24 MCG capsule  Commonly known as:  AMITIZA  Take 1 capsule (24 mcg total) by mouth 2 (two) times daily with a meal.     LYRICA 75 MG capsule  Generic drug:  pregabalin  TAKE ONE CAPSULE BY MOUTH TWICE A DAY     memantine 28 MG Cp24 24 hr capsule  Commonly known as:  NAMENDA XR  Take one capsule by mouth once daily to preserve memory     metoprolol 50 MG tablet  Commonly known as:  LOPRESSOR  Take 1 tablet (50 mg total) by  mouth 2 (two) times daily.     nystatin 100000 UNIT/ML suspension  Commonly known as:  MYCOSTATIN  TAKE 1 TEASPOONFUL BY MOUTH 4 TIMES A DAY as needed for mouth pain     pilocarpine 5 MG tablet  Commonly known as:  SALAGEN  Take 5 mg by mouth 2 (two) times daily.     pravastatin 20 MG tablet  Commonly known as:  PRAVACHOL  Take 1 tablet (20 mg total) by mouth daily at 6 PM.     REFRESH TEARS 0.5 % Soln  Generic drug:  carboxymethylcellulose  Place 1 drop into both eyes 2 (two) times daily as needed (dry eyes).     sodium chloride 0.65 % Soln nasal spray  Commonly known as:  OCEAN  Place 1 spray into the nose as needed for congestion.     triamcinolone ointment 0.1 %  Commonly known as:  KENALOG  Apply 1 application topically 2 (two) times daily as needed (rash).         Charolette Child, RN

## 2014-12-10 ENCOUNTER — Encounter: Payer: Self-pay | Admitting: Internal Medicine

## 2014-12-10 ENCOUNTER — Ambulatory Visit (INDEPENDENT_AMBULATORY_CARE_PROVIDER_SITE_OTHER): Payer: Medicare Other | Admitting: Internal Medicine

## 2014-12-10 VITALS — BP 120/82 | HR 61 | Temp 97.7°F | Resp 20 | Ht 63.0 in | Wt 155.0 lb

## 2014-12-10 DIAGNOSIS — N3001 Acute cystitis with hematuria: Secondary | ICD-10-CM

## 2014-12-10 DIAGNOSIS — IMO0002 Reserved for concepts with insufficient information to code with codable children: Secondary | ICD-10-CM

## 2014-12-10 DIAGNOSIS — W19XXXA Unspecified fall, initial encounter: Secondary | ICD-10-CM | POA: Diagnosis not present

## 2014-12-10 DIAGNOSIS — F0391 Unspecified dementia with behavioral disturbance: Secondary | ICD-10-CM

## 2014-12-10 DIAGNOSIS — N62 Hypertrophy of breast: Secondary | ICD-10-CM | POA: Diagnosis not present

## 2014-12-10 DIAGNOSIS — K5903 Drug induced constipation: Secondary | ICD-10-CM | POA: Diagnosis not present

## 2014-12-10 DIAGNOSIS — Y92099 Unspecified place in other non-institutional residence as the place of occurrence of the external cause: Secondary | ICD-10-CM | POA: Diagnosis not present

## 2014-12-10 DIAGNOSIS — Y92009 Unspecified place in unspecified non-institutional (private) residence as the place of occurrence of the external cause: Secondary | ICD-10-CM

## 2014-12-10 DIAGNOSIS — T402X5A Adverse effect of other opioids, initial encounter: Secondary | ICD-10-CM | POA: Diagnosis not present

## 2014-12-10 DIAGNOSIS — E871 Hypo-osmolality and hyponatremia: Secondary | ICD-10-CM

## 2014-12-10 DIAGNOSIS — E43 Unspecified severe protein-calorie malnutrition: Secondary | ICD-10-CM

## 2014-12-10 DIAGNOSIS — F03918 Unspecified dementia, unspecified severity, with other behavioral disturbance: Secondary | ICD-10-CM

## 2014-12-10 NOTE — Progress Notes (Signed)
Patient ID: Ariana [name], female   DOB: 04/21/1929, 81 y.o.   MRN: [id number]   Location: [facility] Provider: Rexene Edison. Mariea Clonts, D.O., C.M.D.  Code Status: full code--need to discuss this at her next visit if I can get her to focus on one topic Goals of Care: Advanced Directive information Does patient have an advance directive?: Yes  Chief Complaint  Patient presents with  . Hospitalization Follow-up    Follow-up from hospital stay foor UTI  . Medical Management of Chronic Issues    3 month follow-up    HPI: Patient is a 81 y.o. female seen in the office today for hospital f/u.  She was admitted from 10/20-10/25 s/p fall.  She was found to have a pansensitive E coli UTI. She was felt to have delirium from the UTI, but does have baseline dementia, also.  She was rehydrated and treated with IV rocephin that was later changed to ancef.  She had constipation as always while there and did receive a tap water enema on 10/23 resulting in a large bm on the day of discharge.  Her miralax was continued.  She had acute renal failure due to dehydration and poor po intake at home.  Her potassium was also low and her hctz was stopped, but she's apparently still been taking it b/c that was not clear in the paperwork her daughter received at d/c.  She had no problems with her pafib not on anticoagulation due to multiple recurrent falls.  She has had a prior stroke, but it was unclear when it had truly occured.  She returns today for hospital f/u and f/u cbc, bmp recommended by hospitalists.  Per daughter--Falls at home:  9/28, 10/5, and 10/19 Per pt:  Had fallen out of tub the day before.  Couldn't get right leg over the side.  EMS evaluated her and PT had come.  By then, she was half way out in the hall.   Found out she'd had a stroke on the right and it affected her left side.  She's in afib now.  Has been before, but they seem to think this is new.  She cannot take blood thinners due to her  falls.  Was too high risk also for cardioversion.    Left breast has been noted to be larger than the right by pt and her caregiver.  Pt says it was noted at gyn but pt says breasts were not checked then.  ?  Exam performed and recommended mammogram though nothing discrete was felt--Unclear exactly when last mammogram was--done through gyn.  Her daughter is going to check when she had it.  [facility] last shows 2012.  Was noted to have some blood when wiping.  Has h/o hemorrhoids.  Had been having hematuria with UTI and pt notes she still has some of this.  Still has another week of antibiotics.  On keflex po now.  Still c/o abdominal bloating.  Had bump that top came off of and bled.  Had some lower abdominal pain in lower left.    Still has paranoia about the thermostat.  Family regulates from [place] b/c she has a h/o adjusting it to inappropriately high temperatures.  Is drinking and eating well, but could drink more water per her caregiver.  Fortunately, she is getting more caregiver hours now and is getting home health PT again.    Review of Systems:  Review of Systems  Constitutional: Positive for malaise/fatigue. Negative for fever and chills.  HENT: Negative for congestion.   Eyes: Positive for blurred vision.  Cardiovascular: Positive for leg swelling. Negative for chest pain and palpitations.       Left breast larger than right--new finding; edema worse in legs per pt (is notable diffusely)  Gastrointestinal: Positive for abdominal pain, constipation and blood in stool. Negative for melena.       Bloating, larger on right than left  Musculoskeletal: Positive for falls.  Neurological: Positive for focal weakness and weakness. Negative for headaches.  Psychiatric/Behavioral: Positive for depression and memory loss. The patient is nervous/anxious and has insomnia.     Past Medical History  Diagnosis Date  . Sjogren's syndrome ([place])   . Dry eye syndrome   . Hypertension, benign   . Mitral  valve prolapse   . GERD (gastroesophageal reflux disease)   . Diverticulosis of colon   . Irritable bowel syndrome   . Urinary incontinence   . Low back pain syndrome   . Fibromyalgia   . Memory loss   . Anxiety and depression   . History of adverse drug reaction   . Peripheral neuropathy ([facility])     "both feet and legs"  . Shortness of breath 07/18/11    "alot lately"  . Anginal pain ([place])   . History of recurrent UTIs   . H/O hiatal hernia   . Anxiety   . Dementia   . Depression   . Abnormality of gait   . Thyroid nodule   . Headache(784.0) 09/05/2012  . Adenomatous polyp of colon 2002    50mm  . History of cerebrovascular disease 09/24/2014    Past Surgical History  Procedure Laterality Date  . Vesicovaginal fistula closure w/ tah    . Appendectomy    . Cholecystectomy  2000  . Mandible surgery    . Temporomandibular joint surgery  1986    Dr. Terence Lux  . Cataract extraction, bilateral    . Abdominal hysterectomy  1967  . Dental surgery      multiple tooth extractions  . Esophagogastroduodenoscopy (egd) with esophageal dilation N/A 08/23/2012    Procedure: ESOPHAGOGASTRODUODENOSCOPY (EGD) WITH ESOPHAGEAL DILATION;  Surgeon: Milus Banister, MD;  Location: [facility];  Service: Endoscopy;  Laterality: N/A;  . Colonoscopy w/ biopsies      multiple  . Nasal septum surgery  1980  . Transthoracic echocardiogram  2001    mild LVH, normal LV  . Nm myocar perf wall motion  2003    persantine - normal static and dynamic study w/apical thinning and presvered LV function, no ischemia  . Cardiac catheterization  02/17/2003    normal L main, LAD free of disease, Cfx free of disease, RCA free of disease (Dr. Loni Muse. [place])    Allergies  Allergen Reactions  . Banana Nausea And Vomiting  . Codeine Nausea Only    unless given with Phenergan  . Klonopin [Clonazepam] Other (See Comments)    Causes hallucination   . Meperidine Hcl Nausea Only    unless given with Phenergan  .  Norflex [Orphenadrine Citrate] Nausea Only    Unless given with Phenergan  . Oxycodone-Acetaminophen Nausea Only    unless given with phenergan  . Propoxyphene Hcl Nausea Only    unless given with phenergan  . Zoloft [Sertraline Hcl] Other (See Comments)    Caused lethargy  . Doxycycline Other (See Comments)    Unknown  . Naproxen Other (See Comments)  . Penicillins Other (See Comments)    Unknown  .  Phenothiazines Other (See Comments)    Unknown  . Stelazine Other (See Comments)    Unknown  . Sulfamethoxazole-Trimethoprim Other (See Comments)    Unknown  . Tolectin [Tolmetin Sodium] Other (See Comments)    Unknown  . Tramadol Other (See Comments)    Unknown      Medication List       This list is accurate as of: 12/10/14  3:49 PM.  Always use your most recent med list.               ALPRAZolam 0.5 MG tablet  Commonly known as:  XANAX  Take 1 tablet (0.5 mg total) by mouth 2 (two) times daily as needed for anxiety.     aspirin 325 MG tablet  Take 1 tablet (325 mg total) by mouth daily.     bisacodyl 5 MG EC tablet  Commonly known as:  bisacodyl  Take 2 tablets (10 mg total) by mouth daily as needed for moderate constipation. If no effective bowel movement every 2- 3 days     CALCIUM CARBONATE PO  Take 1 tablet by mouth daily.     cephALEXin 500 MG capsule  Commonly known as:  KEFLEX  Take 1 capsule (500 mg total) by mouth every 8 (eight) hours.     cetirizine 10 MG tablet  Commonly known as:  ZYRTEC  Take 1 tablet (10 mg total) by mouth daily.     CRANBERRY SOFT PO  Take 1 capsule by mouth daily.     diltiazem 180 MG 24 hr capsule  Commonly known as:  CARDIZEM CD  Take 180 mg by mouth daily.     DULoxetine 30 MG capsule  Commonly known as:  CYMBALTA  Take one tablet  once daily in the morning for depression     DULoxetine 60 MG capsule  Commonly known as:  CYMBALTA  Take one tablet by mouth in the evening once daily for depression      HYDROcodone-acetaminophen 5-325 MG tablet  Commonly known as:  NORCO/VICODIN  Take 1 tablet by mouth every 6 (six) hours as needed for moderate pain. Take one tablet twice daily for pain     hydroxychloroquine 200 MG tablet  Commonly known as:  PLAQUENIL  Take 200 mg by mouth daily.     lactose free nutrition Liqd  Take 237 mLs by mouth 2 (two) times daily between meals.     lansoprazole 30 MG capsule  Commonly known as:  PREVACID  TAKE ONE CAPSULE BY MOUTH EVERY DAY AT 12 NOON     lubiprostone 24 MCG capsule  Commonly known as:  AMITIZA  Take 1 capsule (24 mcg total) by mouth 2 (two) times daily with a meal.     LYRICA 75 MG capsule  Generic drug:  pregabalin  TAKE ONE CAPSULE BY MOUTH TWICE A DAY     memantine 28 MG Cp24 24 hr capsule  Commonly known as:  NAMENDA XR  Take one capsule by mouth once daily to preserve memory     metoprolol 50 MG tablet  Commonly known as:  LOPRESSOR  Take 1 tablet (50 mg total) by mouth 2 (two) times daily.     nystatin 100000 UNIT/ML suspension  Commonly known as:  MYCOSTATIN  TAKE 1 TEASPOONFUL BY MOUTH 4 TIMES A DAY as needed for mouth pain     pilocarpine 5 MG tablet  Commonly known as:  SALAGEN  Take 5 mg by mouth 2 (two) times daily.  pravastatin 20 MG tablet  Commonly known as:  PRAVACHOL  Take 1 tablet (20 mg total) by mouth daily at 6 PM.     REFRESH TEARS 0.5 % Soln  Generic drug:  carboxymethylcellulose  Place 1 drop into both eyes 2 (two) times daily as needed (dry eyes).     sodium chloride 0.65 % Soln nasal spray  Commonly known as:  OCEAN  Place 1 spray into the nose as needed for congestion.     triamcinolone ointment 0.1 %  Commonly known as:  KENALOG  Apply 1 application topically 2 (two) times daily as needed (rash).        Health Maintenance  Topic Date Due  . DEXA SCAN  07/04/1996  . INFLUENZA VACCINE  09/14/2015  . TETANUS/TDAP  12/09/2023  . ZOSTAVAX  Completed  . PNA vac Low Risk Adult   Completed    Physical Exam: Filed Vitals:   12/10/14 1506  BP: 120/82  Pulse: 61  Temp: 97.7 F (36.5 C)  TempSrc: Oral  Resp: 20  Height: 5\' 3"  (1.6 m)  Weight: 155 lb (70.308 kg)  SpO2: 97%   Body mass index is 27.46 kg/(m^2). Physical Exam  Constitutional: She appears well-nourished. No distress.  Some mild generalized edema/anasarca present  HENT:  Head: Normocephalic and atraumatic.  Cardiovascular:  irreg irreg  Pulmonary/Chest: Effort normal and breath sounds normal. No respiratory distress. She has no rales.  Abdominal: Bowel sounds are normal. She exhibits distension. There is no tenderness.  More prominent on right side than left but abdominal imaging has been unremarkable and was not yet due for cscope when saw GI last per dtr  Musculoskeletal: Normal range of motion.  Still noncompliant with rollator walker (tried to walk to the lab without it, pushing it out of her way and caregiver had to remind her to use it)  Neurological: She is alert.  Mild unilateral weakness of extremities  Skin: Skin is warm and dry.  Psychiatric:  Cannot get her to smile no matter what; flat affect, gets easily agitated as talking and goes off on tangents regularly complaining about various things from her hospitalization    Labs reviewed: Basic Metabolic Panel:  Recent Labs  07/08/14 1513  09/04/14 1226  12/04/14 0136  12/06/14 0440 12/07/14 0453 12/08/14 0630  NA  --   < > 136  < > 140  < > 136 135 131*  K  --   < > 4.3  < > 3.5  < > 4.4 3.6 4.0  CL  --   < > 94*  < > 101  < > 100* 97* 94*  CO2  --   --  27  < > 29  < > 25 28 31   GLUCOSE  --   < > 148*  < > 124*  < > 137* 121* 113*  BUN  --   < > 24  < > 19  < > 11 8 7   CREATININE  --   < > 0.77  < > 0.75  < > 0.77 0.67 0.68  CALCIUM  --   --  10.0  < > 9.5  < > 9.6 9.5 9.3  TSH 0.82  --  1.230  --  1.132  --   --   --   --   < > = values in this interval not displayed. Liver Function Tests:  Recent Labs   12/04/14 0136 12/07/14 1409 12/08/14 0630  AST 31 39 32  ALT 23 30 29   ALKPHOS 86 88 86  BILITOT 0.8 0.7 0.6  PROT 6.8 7.0 6.4*  ALBUMIN 3.6 3.7 3.5    Recent Labs  12/07/14 1409  LIPASE 36    Recent Labs  12/04/14 0815  AMMONIA 28   CBC:  Recent Labs  06/12/14 1231 07/26/14 1635  09/04/14 1226  12/04/14 0136 12/05/14 0353 12/08/14 0630  WBC 5.9 6.5  --  6.2  < > 6.0 5.5 4.3  NEUTROABS 4.0 4.2  --  4.2  --   --   --   --   HGB  --  12.7  < >  --   < > 12.1 11.6* 11.9*  HCT 39.3 39.0  < > 36.2  < > 35.4* 34.7* 35.4*  MCV  --  94.4  --   --   < > 89.8 90.8 89.4  PLT  --  217  --   --   < > 189 203 206  < > = values in this interval not displayed. Lipid Panel:  Recent Labs  07/27/14 0332  CHOL 157  HDL 48  LDLCALC 97  TRIG 58  CHOLHDL 3.3   Lab Results  Component Value Date   HGBA1C 6.3* 07/27/2014    Procedures since last visit: Dg Chest 2 View  12/03/2014  CLINICAL DATA:  Fatigue and altered mental status.  Recent fall. EXAM: CHEST  2 VIEW COMPARISON:  05/06/2013. FINDINGS: The heart is mildly enlarged but stable. There is mild tortuosity and calcification of the thoracic aorta. The lungs demonstrate mild chronic bronchitic and emphysematous changes and left basilar scarring. No infiltrates or effusions. The bony thorax is intact. IMPRESSION: No acute cardiopulmonary findings.  Chronic lung changes Electronically Signed   By: Marijo Sanes M.D.   On: 12/03/2014 19:01   Ct Head Wo Contrast  12/03/2014  CLINICAL DATA:  Recent fall, found lying on ground, initial encounter EXAM: CT HEAD WITHOUT CONTRAST CT CERVICAL SPINE WITHOUT CONTRAST TECHNIQUE: Multidetector CT imaging of the head and cervical spine was performed following the standard protocol without intravenous contrast. Multiplanar CT image reconstructions of the cervical spine were also generated. COMPARISON:  07/26/2014 FINDINGS: CT HEAD FINDINGS Bony calvarium is intact. Diffuse atrophic changes  are seen. A chronic right parietal infarct is noted as well as scattered areas of chronic white matter ischemic change. There is also evidence of prior infarct in the right subinsular region. No acute hemorrhage, acute infarction or space-occupying mass lesion are not seen. CT CERVICAL SPINE FINDINGS Seven cervical segments are well visualized. Vertebral body height is well maintained. Mild osteophytic changes and facet hypertrophic changes are seen. No findings to suggest acute fracture or acute facet abnormality are noted. Stable left thyroid lesion which is been previously biopsied is noted. No other soft tissue abnormality is seen. The visualized lung apices are within normal limits. IMPRESSION: CT of the head: Chronic areas of previous ischemia.  No acute abnormality is noted. CT of cervical spine: Degenerative change without acute abnormality. Stable left thyroid lesion Electronically Signed   By: Inez Catalina M.D.   On: 12/03/2014 17:00   Ct Cervical Spine Wo Contrast  12/03/2014  CLINICAL DATA:  Recent fall, found lying on ground, initial encounter EXAM: CT HEAD WITHOUT CONTRAST CT CERVICAL SPINE WITHOUT CONTRAST TECHNIQUE: Multidetector CT imaging of the head and cervical spine was performed following the standard protocol without intravenous contrast. Multiplanar CT image reconstructions of the cervical spine were also generated. COMPARISON:  07/26/2014  FINDINGS: CT HEAD FINDINGS Bony calvarium is intact. Diffuse atrophic changes are seen. A chronic right parietal infarct is noted as well as scattered areas of chronic white matter ischemic change. There is also evidence of prior infarct in the right subinsular region. No acute hemorrhage, acute infarction or space-occupying mass lesion are not seen. CT CERVICAL SPINE FINDINGS Seven cervical segments are well visualized. Vertebral body height is well maintained. Mild osteophytic changes and facet hypertrophic changes are seen. No findings to suggest  acute fracture or acute facet abnormality are noted. Stable left thyroid lesion which is been previously biopsied is noted. No other soft tissue abnormality is seen. The visualized lung apices are within normal limits. IMPRESSION: CT of the head: Chronic areas of previous ischemia.  No acute abnormality is noted. CT of cervical spine: Degenerative change without acute abnormality. Stable left thyroid lesion Electronically Signed   By: Inez Catalina M.D.   On: 12/03/2014 17:00   Mr Brain Wo Contrast  12/04/2014  CLINICAL DATA:  Initial evaluation for acute encephalopathy. EXAM: MRI HEAD WITHOUT CONTRAST TECHNIQUE: Multiplanar, multiecho pulse sequences of the brain and surrounding structures were obtained without intravenous contrast. COMPARISON:  Prior CT from 12/03/2014. FINDINGS: Diffuse prominence of the CSF containing spaces is compatible with age-related cerebral atrophy. Mild T2/FLAIR hyperintensity within the periventricular white matter likely related to chronic small vessel ischemic disease. Focal encephalomalacia within the right frontal lobe and right parietal lobe present, consistent with remote infarcts. No abnormal foci of restricted diffusion to suggest acute intracranial infarct. Gray-white matter differentiation maintained. Normal intravascular flow voids are preserved. No acute or chronic intracranial hemorrhage. No mass lesion, midline shift, or mass effect. No hydrocephalus. No extra-axial fluid collection. Craniocervical junction within normal limits. Pituitary gland normal. No acute abnormality about the orbits. Sequela prior bilateral lens extraction noted. Mild mucosal thickening within the ethmoidal air cells. Right maxillary sinus is hypoplastic in appearance. No evidence for active sinus infection. No mastoid effusion. Inner ear structures normal. Bone marrow signal intensity within normal limits. No scalp soft tissue abnormality. IMPRESSION: 1. No acute intracranial infarct or other  process identified. 2. Age-related cerebral atrophy with mild chronic small vessel ischemic disease and remote right frontal and parietal lobe infarcts. Electronically Signed   By: Jeannine Boga M.D.   On: 12/04/2014 03:01   Dg Pelvis Portable  12/04/2014  CLINICAL DATA:  Fall. EXAM: PORTABLE PELVIS 1-2 VIEWS COMPARISON:  None. FINDINGS: No acute bony or joint abnormality identified. Degenerative changes both hips. No evidence of fracture or dislocation. IMPRESSION: No acute bony or joint abnormality.  Degenerative changes both hips. Electronically Signed   By: Marcello Moores  Register   On: 12/04/2014 08:09   Dg Hand Complete Left  12/03/2014  CLINICAL DATA:  Left hand pain and swelling after fall onto an outstretched hand. Pain about the metacarpal phalangeal joints. EXAM: LEFT HAND - COMPLETE 3+ VIEW COMPARISON:  Radiographs 02/09/2008 FINDINGS: Patient unable to remove rings from the fourth digit. No acute fracture. Progressive osteoarthritis involving the thumb, index and middle finger metacarpal phalangeal joints with increased joint space narrowing. Additional scattered osteoarthritis throughout the left hand. No dislocation. There is mild dorsal soft tissue edema. No radiopaque foreign body. IMPRESSION: 1. No acute fracture or dislocation. 2. 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 Electronically Signed   By: Jeb Levering M.D.   On: 12/03/2014 19:03   Assessment/Plan 1. Fall at home, initial encounter - seems she's recovered from this and it was brough on by  a UTI with hematuria (true infection) and dehydration from poor po intake at home -completing abx as below and working with home health PT, f/u labs - Comprehensive metabolic panel  2. Acute cystitis with hematuria - complete course of keflex as ordered  -if hematuria persists, will need to see urology for further workup - CBC with Differential/Platelet - Comprehensive metabolic panel  3. Therapeutic  opioid induced constipation - chronic, does not stick with bowel regimen as ordered -is eating better now with caregiver help and should also be doing better with taking meds regularly - Comprehensive metabolic panel  4. Senile dementia, with behavioral disturbance - gradually progressive, likely vascular considering her afib and not helped by her xanax use -attempts to taper this and add SSRI/SNRIs have not been a success--she is dependent -falls may be due in part to progression of this and mild hemiparesis from her stroke she had  -must use walker at all times -encouraged hydration and adequate po intake, proper medication use - Comprehensive metabolic panel  5. Protein-calorie malnutrition, severe ([place]) - doing better, but still has some anasarca suggestive of this problem -is taking ensure strawberry and vanilla well - Comprehensive metabolic panel  6. Breast enlargement -left breast vs. Right -advised mammogram--supposedly one was done in the past year, but we have no records of it--Ariana [name] to look into this - Comprehensive metabolic panel  Labs/tests ordered:  Cbc, cmp Pt was to stop hctz and didn't so need to check Na and K levels, renal function  Next appt:  6 wks   Ariana [name], D.O. [facility] [address], [place] [address] Cell Phone (Mon-Fri 8am-5pm):  [phone] On Call:  [phone] & follow prompts after 5pm & weekends Office Phone:  [phone] Office Fax:  [phone]

## 2014-12-11 ENCOUNTER — Other Ambulatory Visit: Payer: Self-pay

## 2014-12-11 DIAGNOSIS — E871 Hypo-osmolality and hyponatremia: Secondary | ICD-10-CM

## 2014-12-11 LAB — BASIC METABOLIC PANEL
BUN/Creatinine Ratio: 28 — ABNORMAL HIGH (ref 11–26)
BUN: 23 mg/dL (ref 8–27)
CO2: 26 mmol/L (ref 18–29)
Calcium: 9.7 mg/dL (ref 8.7–10.3)
Chloride: 87 mmol/L — ABNORMAL LOW (ref 97–106)
Creatinine, Ser: 0.82 mg/dL (ref 0.57–1.00)
GFR calc Af Amer: 77 mL/min/{1.73_m2} (ref >59)
GFR calc non Af Amer: 66 mL/min/{1.73_m2} (ref >59)
Glucose: 83 mg/dL (ref 65–99)
Potassium: 4.1 mmol/L (ref 3.5–5.2)
Sodium: 128 mmol/L — ABNORMAL LOW (ref 136–144)

## 2014-12-11 LAB — CBC WITH DIFFERENTIAL/PLATELET
Basophils Absolute: 0 10*3/uL (ref 0.0–0.2)
Basos: 0 %
EOS (ABSOLUTE): 0.2 10*3/uL (ref 0.0–0.4)
Eos: 4 %
Hematocrit: 35.2 % (ref 34.0–46.6)
Hemoglobin: 12 g/dL (ref 11.1–15.9)
Immature Grans (Abs): 0 10*3/uL (ref 0.0–0.1)
Immature Granulocytes: 1 %
Lymphocytes Absolute: 1.5 10*3/uL (ref 0.7–3.1)
Lymphs: 30 %
MCH: 30 pg (ref 26.6–33.0)
MCHC: 34.1 g/dL (ref 31.5–35.7)
MCV: 88 fL (ref 79–97)
Monocytes Absolute: 0.6 10*3/uL (ref 0.1–0.9)
Monocytes: 12 %
Neutrophils Absolute: 2.7 10*3/uL (ref 1.4–7.0)
Neutrophils: 53 %
Platelets: 241 10*3/uL (ref 150–379)
RBC: 4 x10E6/uL (ref 3.77–5.28)
RDW: 13.5 % (ref 12.3–15.4)
WBC: 5.1 10*3/uL (ref 3.4–10.8)

## 2014-12-13 ENCOUNTER — Encounter: Payer: Self-pay | Admitting: Internal Medicine

## 2014-12-16 ENCOUNTER — Emergency Department (HOSPITAL_COMMUNITY): Payer: Medicare Other

## 2014-12-16 ENCOUNTER — Encounter (HOSPITAL_COMMUNITY): Payer: Self-pay | Admitting: Emergency Medicine

## 2014-12-16 ENCOUNTER — Ambulatory Visit (INDEPENDENT_AMBULATORY_CARE_PROVIDER_SITE_OTHER): Payer: Medicare Other | Admitting: Family Medicine

## 2014-12-16 ENCOUNTER — Observation Stay (HOSPITAL_COMMUNITY)
Admission: EM | Admit: 2014-12-16 | Discharge: 2014-12-18 | Disposition: A | Discharge status: Home / Self Care | Payer: Medicare Other | Attending: Internal Medicine | Admitting: Internal Medicine

## 2014-12-16 VITALS — BP 136/76 | HR 58 | Temp 98.0°F | Resp 16

## 2014-12-16 DIAGNOSIS — G459 Transient cerebral ischemic attack, unspecified: Principal | ICD-10-CM | POA: Insufficient documentation

## 2014-12-16 DIAGNOSIS — Z91018 Allergy to other foods: Secondary | ICD-10-CM | POA: Insufficient documentation

## 2014-12-16 DIAGNOSIS — R0789 Other chest pain: Secondary | ICD-10-CM | POA: Diagnosis not present

## 2014-12-16 DIAGNOSIS — Z888 Allergy status to other drugs, medicaments and biological substances status: Secondary | ICD-10-CM | POA: Diagnosis not present

## 2014-12-16 DIAGNOSIS — Z880 Allergy status to penicillin: Secondary | ICD-10-CM | POA: Diagnosis not present

## 2014-12-16 DIAGNOSIS — K589 Irritable bowel syndrome without diarrhea: Secondary | ICD-10-CM | POA: Insufficient documentation

## 2014-12-16 DIAGNOSIS — R1013 Epigastric pain: Secondary | ICD-10-CM

## 2014-12-16 DIAGNOSIS — M797 Fibromyalgia: Secondary | ICD-10-CM | POA: Diagnosis not present

## 2014-12-16 DIAGNOSIS — M3500 Sicca syndrome, unspecified: Secondary | ICD-10-CM | POA: Diagnosis not present

## 2014-12-16 DIAGNOSIS — R531 Weakness: Secondary | ICD-10-CM

## 2014-12-16 DIAGNOSIS — Z7982 Long term (current) use of aspirin: Secondary | ICD-10-CM | POA: Insufficient documentation

## 2014-12-16 DIAGNOSIS — R269 Unspecified abnormalities of gait and mobility: Secondary | ICD-10-CM | POA: Insufficient documentation

## 2014-12-16 DIAGNOSIS — Z881 Allergy status to other antibiotic agents status: Secondary | ICD-10-CM | POA: Insufficient documentation

## 2014-12-16 DIAGNOSIS — Z885 Allergy status to narcotic agent status: Secondary | ICD-10-CM | POA: Diagnosis not present

## 2014-12-16 DIAGNOSIS — F329 Major depressive disorder, single episode, unspecified: Secondary | ICD-10-CM | POA: Insufficient documentation

## 2014-12-16 DIAGNOSIS — F0390 Unspecified dementia without behavioral disturbance: Secondary | ICD-10-CM | POA: Diagnosis not present

## 2014-12-16 DIAGNOSIS — K581 Irritable bowel syndrome with constipation: Secondary | ICD-10-CM

## 2014-12-16 DIAGNOSIS — R4781 Slurred speech: Secondary | ICD-10-CM

## 2014-12-16 DIAGNOSIS — K219 Gastro-esophageal reflux disease without esophagitis: Secondary | ICD-10-CM | POA: Diagnosis not present

## 2014-12-16 DIAGNOSIS — H04129 Dry eye syndrome of unspecified lacrimal gland: Secondary | ICD-10-CM | POA: Diagnosis not present

## 2014-12-16 DIAGNOSIS — Z9049 Acquired absence of other specified parts of digestive tract: Secondary | ICD-10-CM | POA: Diagnosis not present

## 2014-12-16 DIAGNOSIS — G629 Polyneuropathy, unspecified: Secondary | ICD-10-CM | POA: Insufficient documentation

## 2014-12-16 DIAGNOSIS — R479 Unspecified speech disturbances: Secondary | ICD-10-CM | POA: Diagnosis present

## 2014-12-16 DIAGNOSIS — R490 Dysphonia: Secondary | ICD-10-CM

## 2014-12-16 DIAGNOSIS — I10 Essential (primary) hypertension: Secondary | ICD-10-CM | POA: Insufficient documentation

## 2014-12-16 DIAGNOSIS — F03A0 Unspecified dementia, mild, without behavioral disturbance, psychotic disturbance, mood disturbance, and anxiety: Secondary | ICD-10-CM | POA: Diagnosis present

## 2014-12-16 DIAGNOSIS — E785 Hyperlipidemia, unspecified: Secondary | ICD-10-CM | POA: Diagnosis not present

## 2014-12-16 DIAGNOSIS — R079 Chest pain, unspecified: Secondary | ICD-10-CM | POA: Diagnosis not present

## 2014-12-16 DIAGNOSIS — I341 Nonrheumatic mitral (valve) prolapse: Secondary | ICD-10-CM | POA: Insufficient documentation

## 2014-12-16 DIAGNOSIS — M6289 Other specified disorders of muscle: Secondary | ICD-10-CM

## 2014-12-16 DIAGNOSIS — I480 Paroxysmal atrial fibrillation: Secondary | ICD-10-CM | POA: Insufficient documentation

## 2014-12-16 LAB — DIFFERENTIAL
BASOS ABS: 0 10*3/uL (ref 0.0–0.1)
Basophils Relative: 0 %
EOS ABS: 0.2 10*3/uL (ref 0.0–0.7)
Eosinophils Relative: 4 %
LYMPHS ABS: 1.5 10*3/uL (ref 0.7–4.0)
Lymphocytes Relative: 30 %
Monocytes Absolute: 0.9 10*3/uL (ref 0.1–1.0)
Monocytes Relative: 18 %
NEUTROS ABS: 2.5 10*3/uL (ref 1.7–7.7)
NEUTROS PCT: 48 %

## 2014-12-16 LAB — COMPREHENSIVE METABOLIC PANEL
ALBUMIN: 3.3 g/dL — AB (ref 3.5–5.0)
ALT: 20 U/L (ref 14–54)
AST: 24 U/L (ref 15–41)
Alkaline Phosphatase: 74 U/L (ref 38–126)
Anion gap: 6 (ref 5–15)
BILIRUBIN TOTAL: 0.3 mg/dL (ref 0.3–1.2)
BUN: 15 mg/dL (ref 6–20)
CHLORIDE: 106 mmol/L (ref 101–111)
CO2: 27 mmol/L (ref 22–32)
Calcium: 9.1 mg/dL (ref 8.9–10.3)
Creatinine, Ser: 0.71 mg/dL (ref 0.44–1.00)
GFR calc Af Amer: >60 mL/min (ref >60)
GFR calc non Af Amer: >60 mL/min (ref >60)
GLUCOSE: 114 mg/dL — AB (ref 65–99)
POTASSIUM: 4.1 mmol/L (ref 3.5–5.1)
Sodium: 139 mmol/L (ref 135–145)
Total Protein: 6 g/dL — ABNORMAL LOW (ref 6.5–8.1)

## 2014-12-16 LAB — RAPID URINE DRUG SCREEN, HOSP PERFORMED
Amphetamines: NOT DETECTED
Barbiturates: NOT DETECTED
Benzodiazepines: POSITIVE — AB
COCAINE: NOT DETECTED
Opiates: POSITIVE — AB
Tetrahydrocannabinol: NOT DETECTED

## 2014-12-16 LAB — I-STAT CHEM 8, ED
BUN: 18 mg/dL (ref 6–20)
CHLORIDE: 100 mmol/L — AB (ref 101–111)
CREATININE: 0.7 mg/dL (ref 0.44–1.00)
Calcium, Ion: 1.17 mmol/L (ref 1.13–1.30)
GLUCOSE: 107 mg/dL — AB (ref 65–99)
HEMATOCRIT: 35 % — AB (ref 36.0–46.0)
HEMOGLOBIN: 11.9 g/dL — AB (ref 12.0–15.0)
POTASSIUM: 4 mmol/L (ref 3.5–5.1)
Sodium: 140 mmol/L (ref 135–145)
TCO2: 26 mmol/L (ref 0–100)

## 2014-12-16 LAB — URINALYSIS, ROUTINE W REFLEX MICROSCOPIC
Bilirubin Urine: NEGATIVE
Glucose, UA: NEGATIVE mg/dL
Hgb urine dipstick: NEGATIVE
Ketones, ur: NEGATIVE mg/dL
Nitrite: NEGATIVE
PROTEIN: NEGATIVE mg/dL
Specific Gravity, Urine: 1.008 (ref 1.005–1.030)
UROBILINOGEN UA: 0.2 mg/dL (ref 0.0–1.0)
pH: 7.5 (ref 5.0–8.0)

## 2014-12-16 LAB — PROTIME-INR
INR: 1.15 (ref 0.00–1.49)
Prothrombin Time: 14.8 seconds (ref 11.6–15.2)

## 2014-12-16 LAB — CBC
HCT: 33.6 % — ABNORMAL LOW (ref 36.0–46.0)
HEMOGLOBIN: 11 g/dL — AB (ref 12.0–15.0)
MCH: 29.7 pg (ref 26.0–34.0)
MCHC: 32.7 g/dL (ref 30.0–36.0)
MCV: 90.8 fL (ref 78.0–100.0)
Platelets: 225 10*3/uL (ref 150–400)
RBC: 3.7 MIL/uL — AB (ref 3.87–5.11)
RDW: 13.2 % (ref 11.5–15.5)
WBC: 5.1 10*3/uL (ref 4.0–10.5)

## 2014-12-16 LAB — I-STAT TROPONIN, ED: Troponin i, poc: 0 ng/mL (ref 0.00–0.08)

## 2014-12-16 LAB — CBG MONITORING, ED: Glucose-Capillary: 110 mg/dL — ABNORMAL HIGH (ref 65–99)

## 2014-12-16 LAB — URINE MICROSCOPIC-ADD ON

## 2014-12-16 LAB — TROPONIN I: Troponin I: <0.03 ng/mL (ref <0.031)

## 2014-12-16 LAB — APTT: APTT: 30 s (ref 24–37)

## 2014-12-16 LAB — ETHANOL: Alcohol, Ethyl (B): <5 mg/dL (ref <5)

## 2014-12-16 MED ORDER — LUBIPROSTONE 24 MCG PO CAPS
24.0000 ug | ORAL_CAPSULE | Freq: Two times a day (BID) | ORAL | Status: DC
  Administered 2014-12-17 – 2014-12-18 (×3): 24 ug via ORAL
  Filled 2014-12-16 (×3): qty 1

## 2014-12-16 MED ORDER — ONDANSETRON HCL 4 MG/2ML IJ SOLN: 4.0000 mg | Freq: Four times a day (QID) | INTRAMUSCULAR | Status: DC | PRN

## 2014-12-16 MED ORDER — SODIUM CHLORIDE 0.9 % IV SOLN: 250.0000 mL | INTRAVENOUS | Status: DC | PRN

## 2014-12-16 MED ORDER — PREGABALIN 75 MG PO CAPS
75.0000 mg | ORAL_CAPSULE | Freq: Two times a day (BID) | ORAL | Status: DC
  Administered 2014-12-17 – 2014-12-18 (×3): 75 mg via ORAL
  Filled 2014-12-16 (×3): qty 1

## 2014-12-16 MED ORDER — SODIUM CHLORIDE 0.9 % IJ SOLN
3.0000 mL | Freq: Two times a day (BID) | INTRAMUSCULAR | Status: DC
  Administered 2014-12-16 – 2014-12-18 (×2): 3 mL via INTRAVENOUS

## 2014-12-16 MED ORDER — ALPRAZOLAM 0.5 MG PO TABS
0.5000 mg | ORAL_TABLET | Freq: Two times a day (BID) | ORAL | Status: DC | PRN
  Administered 2014-12-18: 0.5 mg via ORAL
  Filled 2014-12-16: qty 1

## 2014-12-16 MED ORDER — ASPIRIN 325 MG PO TABS
325.0000 mg | ORAL_TABLET | Freq: Every day | ORAL | Status: DC
  Administered 2014-12-17 – 2014-12-18 (×2): 325 mg via ORAL
  Filled 2014-12-16 (×2): qty 1

## 2014-12-16 MED ORDER — ACETAMINOPHEN 650 MG RE SUPP
650.0000 mg | Freq: Four times a day (QID) | RECTAL | Status: DC | PRN
  Administered 2014-12-16: 650 mg via RECTAL
  Filled 2014-12-16: qty 1

## 2014-12-16 MED ORDER — SODIUM CHLORIDE 0.9 % IJ SOLN
3.0000 mL | Freq: Two times a day (BID) | INTRAMUSCULAR | Status: DC
  Administered 2014-12-17 – 2014-12-18 (×2): 3 mL via INTRAVENOUS

## 2014-12-16 MED ORDER — DILTIAZEM HCL ER COATED BEADS 180 MG PO CP24
180.0000 mg | ORAL_CAPSULE | Freq: Every day | ORAL | Status: DC
  Administered 2014-12-17 – 2014-12-18 (×2): 180 mg via ORAL
  Filled 2014-12-16 (×2): qty 1

## 2014-12-16 MED ORDER — METOPROLOL TARTRATE 50 MG PO TABS
50.0000 mg | ORAL_TABLET | Freq: Two times a day (BID) | ORAL | Status: DC
  Administered 2014-12-17 – 2014-12-18 (×3): 50 mg via ORAL
  Filled 2014-12-16 (×3): qty 1

## 2014-12-16 MED ORDER — ASPIRIN 81 MG PO CHEW
324.0000 mg | CHEWABLE_TABLET | Freq: Once | ORAL | Status: AC
  Administered 2014-12-16: 324 mg via ORAL

## 2014-12-16 MED ORDER — MEMANTINE HCL ER 28 MG PO CP24
28.0000 mg | ORAL_CAPSULE | Freq: Every day | ORAL | Status: DC
  Administered 2014-12-17 – 2014-12-18 (×2): 28 mg via ORAL
  Filled 2014-12-16 (×2): qty 1

## 2014-12-16 MED ORDER — PILOCARPINE HCL 5 MG PO TABS
5.0000 mg | ORAL_TABLET | Freq: Two times a day (BID) | ORAL | Status: DC
  Administered 2014-12-17 – 2014-12-18 (×3): 5 mg via ORAL
  Filled 2014-12-16 (×5): qty 1

## 2014-12-16 MED ORDER — ONDANSETRON HCL 4 MG PO TABS: 4.0000 mg | ORAL_TABLET | Freq: Four times a day (QID) | ORAL | Status: DC | PRN

## 2014-12-16 MED ORDER — DULOXETINE HCL 60 MG PO CPEP
60.0000 mg | ORAL_CAPSULE | Freq: Every evening | ORAL | Status: DC
  Administered 2014-12-17: 60 mg via ORAL
  Filled 2014-12-16: qty 1

## 2014-12-16 MED ORDER — HYDROCODONE-ACETAMINOPHEN 5-325 MG PO TABS
1.0000 | ORAL_TABLET | Freq: Four times a day (QID) | ORAL | Status: DC | PRN
  Administered 2014-12-17: 1 via ORAL
  Filled 2014-12-16: qty 1

## 2014-12-16 MED ORDER — HEPARIN SODIUM (PORCINE) 5000 UNIT/ML IJ SOLN
5000.0000 [IU] | Freq: Three times a day (TID) | INTRAMUSCULAR | Status: DC
  Administered 2014-12-16 – 2014-12-18 (×6): 5000 [IU] via SUBCUTANEOUS
  Filled 2014-12-16 (×6): qty 1

## 2014-12-16 MED ORDER — BISACODYL 5 MG PO TBEC: 5.0000 mg | DELAYED_RELEASE_TABLET | Freq: Every day | ORAL | Status: DC | PRN

## 2014-12-16 MED ORDER — DULOXETINE HCL 30 MG PO CPEP
30.0000 mg | ORAL_CAPSULE | Freq: Every morning | ORAL | Status: DC
  Administered 2014-12-17 – 2014-12-18 (×2): 30 mg via ORAL
  Filled 2014-12-16 (×2): qty 1

## 2014-12-16 MED ORDER — ACETAMINOPHEN 325 MG PO TABS
650.0000 mg | ORAL_TABLET | Freq: Four times a day (QID) | ORAL | Status: DC | PRN
  Administered 2014-12-17 – 2014-12-18 (×2): 650 mg via ORAL
  Filled 2014-12-16 (×2): qty 2

## 2014-12-16 MED ORDER — STROKE: EARLY STAGES OF RECOVERY BOOK
Freq: Once | Status: DC
  Filled 2014-12-16 (×2): qty 1

## 2014-12-16 MED ORDER — GI COCKTAIL ~~LOC~~
30.0000 mL | Freq: Three times a day (TID) | ORAL | Status: DC | PRN
  Filled 2014-12-16: qty 30

## 2014-12-16 MED ORDER — SODIUM CHLORIDE 0.9 % IJ SOLN: 3.0000 mL | INTRAMUSCULAR | Status: DC | PRN

## 2014-12-16 MED ORDER — PRAVASTATIN SODIUM 20 MG PO TABS
20.0000 mg | ORAL_TABLET | Freq: Every day | ORAL | Status: DC
  Administered 2014-12-17: 20 mg via ORAL
  Filled 2014-12-16: qty 1

## 2014-12-16 NOTE — Progress Notes (Signed)
This chart was scribed for Robyn Haber, MD by Moises Blood, medical scribe at [facility].The patient was seen in exam room 6 and the patient's care was started at 11:09 AM.  Patient ID: Ariana [name] MRN: [id number], DOB: [id number], 81 y.o. Date of Encounter: 12/16/2014  Primary Physician: Hollace Kinnier, DO  Chief Complaint: No chief complaint on file.   HPI:  Ariana [name] is a 81 y.o. female who presents to [facility] complaining of chest pain with shortness of breath. She's been having this since 9:30AM this morning. Her speech was slurring. She had her BP checked, and stated that she had the shortness of breath. Normally, she would be able to bear her own weight and able to ambulate, but today, she had some gait problems. She has h/o stroke.   She also reported being constipated. She was recently seen at the ED for UTI.   She was brought in by her home nurse.   Past Medical History  Diagnosis Date  . Sjogren's syndrome ([place])   . Dry eye syndrome   . Hypertension, benign   . Mitral valve prolapse   . GERD (gastroesophageal reflux disease)   . Diverticulosis of colon   . Irritable bowel syndrome   . Urinary incontinence   . Low back pain syndrome   . Fibromyalgia   . Memory loss   . Anxiety and depression   . History of adverse drug reaction   . Peripheral neuropathy (HCC)     "both feet and legs"  . Shortness of breath 07/18/11    "alot lately"  . Anginal pain ([place])   . History of recurrent UTIs   . H/O hiatal hernia   . Anxiety   . Dementia   . Depression   . Abnormality of gait   . Thyroid nodule   . Headache(784.0) 09/05/2012  . Adenomatous polyp of colon 2002    70mm  . History of cerebrovascular disease 09/24/2014     Home Meds: Prior to Admission medications   Medication Sig Start Date End Date Taking? Authorizing Provider  ALPRAZolam Duanne Moron) 0.5 MG tablet Take 1 tablet (0.5 mg total) by mouth 2 (two) times  daily as needed for anxiety. 12/07/14   Reyne Dumas, MD  aspirin 325 MG tablet Take 1 tablet (325 mg total) by mouth daily. 07/28/14   Reyne Dumas, MD  bisacodyl (BISACODYL) 5 MG EC tablet Take 2 tablets (10 mg total) by mouth daily as needed for moderate constipation. If no effective bowel movement every 2- 3 days Patient taking differently: Take 5-10 mg by mouth daily as needed for moderate constipation. If no effective bowel movement every 2- 3 days 07/08/14   Gatha Mayer, MD  CALCIUM CARBONATE PO Take 1 tablet by mouth daily.    Historical Provider, MD  carboxymethylcellulose (REFRESH TEARS) 0.5 % SOLN Place 1 drop into both eyes 2 (two) times daily as needed (dry eyes).     Historical Provider, MD  cetirizine (ZYRTEC) 10 MG tablet Take 1 tablet (10 mg total) by mouth daily. 03/23/14   Tiffany L Reed, DO  CRANBERRY SOFT PO Take 1 capsule by mouth daily.    Historical Provider, MD  diltiazem (CARDIZEM CD) 180 MG 24 hr capsule Take 180 mg by mouth daily.    Historical Provider, MD  DULoxetine (CYMBALTA) 30 MG capsule Take one tablet  once daily in the morning for depression Patient taking differently: Take 30 mg  by mouth every morning. Take one tablet  once daily in the morning for depression 09/24/14   Kathrynn Ducking, MD  DULoxetine (CYMBALTA) 60 MG capsule Take one tablet by mouth in the evening once daily for depression Patient taking differently: Take 60 mg by mouth every evening. Take one tablet by mouth in the evening once daily for depression 09/24/14   Kathrynn Ducking, MD  HYDROcodone-acetaminophen (NORCO/VICODIN) 5-325 MG tablet Take 1 tablet by mouth every 6 (six) hours as needed for moderate pain. Take one tablet twice daily for pain 12/07/14   Reyne Dumas, MD  hydroxychloroquine (PLAQUENIL) 200 MG tablet Take 200 mg by mouth daily.     Historical Provider, MD  lactose free nutrition (BOOST PLUS) LIQD Take 237 mLs by mouth 2 (two) times daily between meals. 07/22/13   Maryann Mikhail, DO   lansoprazole (PREVACID) 30 MG capsule TAKE ONE CAPSULE BY MOUTH EVERY DAY AT 12 NOON 08/21/14   Tiffany L Reed, DO  lubiprostone (AMITIZA) 24 MCG capsule Take 1 capsule (24 mcg total) by mouth 2 (two) times daily with a meal. 07/08/14   Gatha Mayer, MD  LYRICA 75 MG capsule TAKE ONE CAPSULE BY MOUTH TWICE A DAY 11/13/14   Tiffany L Reed, DO  memantine (NAMENDA XR) 28 MG CP24 24 hr capsule Take one capsule by mouth once daily to preserve memory Patient taking differently: Take 28 mg by mouth daily. Take one capsule by mouth once daily to preserve memory 11/13/14   Tiffany L Reed, DO  metoprolol (LOPRESSOR) 50 MG tablet Take 1 tablet (50 mg total) by mouth 2 (two) times daily. 07/03/14   Pixie Casino, MD  nystatin (MYCOSTATIN) 100000 UNIT/ML suspension TAKE 1 TEASPOONFUL BY MOUTH 4 TIMES A DAY as needed for mouth pain Patient taking differently: Take 5 mLs by mouth 4 (four) times daily as needed (for mouth pain).  09/04/14   Tiffany L Reed, DO  pilocarpine (SALAGEN) 5 MG tablet Take 5 mg by mouth 2 (two) times daily.    Historical Provider, MD  pravastatin (PRAVACHOL) 20 MG tablet Take 1 tablet (20 mg total) by mouth daily at 6 PM. 10/23/14   Tiffany L Reed, DO  sodium chloride (OCEAN) 0.65 % SOLN nasal spray Place 1 spray into the nose as needed for congestion. 08/23/12   Modena Jansky, MD  triamcinolone ointment (KENALOG) 0.1 % Apply 1 application topically 2 (two) times daily as needed (rash).  04/01/14   Historical Provider, MD    Allergies:  Allergies  Allergen Reactions  . Banana Nausea And Vomiting  . Codeine Nausea Only    unless given with Phenergan  . Klonopin [Clonazepam] Other (See Comments)    Causes hallucination   . Meperidine Hcl Nausea Only    unless given with Phenergan  . Norflex [Orphenadrine Citrate] Nausea Only    Unless given with Phenergan  . Oxycodone-Acetaminophen Nausea Only    unless given with phenergan  . Propoxyphene Hcl Nausea Only    unless given with  phenergan  . Zoloft [Sertraline Hcl] Other (See Comments)    Caused lethargy  . Doxycycline Other (See Comments)    Unknown  . Naproxen Other (See Comments)  . Penicillins Other (See Comments)    Unknown  . Phenothiazines Other (See Comments)    Unknown  . Stelazine Other (See Comments)    Unknown  . Sulfamethoxazole-Trimethoprim Other (See Comments)    Unknown  . Tolectin [Tolmetin Sodium] Other (See Comments)  Unknown  . Tramadol Other (See Comments)    Unknown    Social History   Social History  . Marital Status: Widowed    Spouse Name: N/A  . Number of Children: 2  . Years of Education: 12   Occupational History  . Retired    Social History Main Topics  . Smoking status: Former Research scientist (life sciences)  . Smokeless tobacco: Never Used     Comment: Quit at age 30   . Alcohol Use: No  . Drug Use: No  . Sexual Activity: No   Other Topics Concern  . 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    Patient is Widowed.    Patient has 2 children.    Patient is retired.    Former smoker   Alcohol none   Exercise Walk, exercise chair 4 days a week   POA    Walks with cane      Patient drinks about 1-2 cups of hot tea daily.   Patient is right handed.                    Review of Systems: Constitutional: negative for chills, fever, night sweats, weight changes, or fatigue  HEENT: negative for vision changes, hearing loss, congestion, rhinorrhea, ST, epistaxis, or sinus pressure Cardiovascular: negative for palpitations; positive for chest pain Respiratory: negative for hemoptysis, wheezing, or cough; positive for shortness of breath Abdominal: negative for abdominal pain, nausea, vomiting, diarrhea; positive for constipation Dermatological: negative for rash Neurologic: negative for headache, dizziness, or syncope; positive for gait problem All other systems reviewed and are otherwise negative with the exception to those  above and in the HPI.  Physical Exam: Blood pressure 136/76, pulse 58, temperature 98 F (36.7 C), temperature source Oral, resp. rate 16, SpO2 95 %., There is no weight on file to calculate BMI. General: Well developed, well nourished, in no acute distress. Head: Normocephalic, atraumatic, eyes without discharge, sclera non-icteric, nares are without discharge. Bilateral auditory canals clear, TM's are without perforation, pearly grey and translucent with reflective cone of light bilaterally. Oral cavity moist, posterior pharynx without exudate, erythema, peritonsillar abscess, or post nasal drip; usually has dentures   Neck: Supple. No thyromegaly. Full ROM. No lymphadenopathy. Lungs: Clear bilaterally to auscultation without wheezes, rales, or rhonchi. Breathing is unlabored. Heart: RRR with S1 S2. No rubs, or gallops appreciated. holosystolic murmur best heard at left sternal border in 2nd interspace Abdomen: Soft, non-tender, non-distended with normoactive bowel sounds. No hepatomegaly. No rebound/guarding. No obvious abdominal masses. Extremities/Skin: Warm and dry. No clubbing or cyanosis. Left hand: mildly swollen Neuro: Alert and oriented X 3. left side slightly weaker than right,  No drift Psych:  Responds to questions appropriately with a normal affect.   EKG: sinus bradycardia  11:16AM, IV was placed, 4 aspirin given, emergent EMS was called 11:25AM, emergent EMS arrived  ASSESSMENT AND PLAN:  81 y.o. year old female with acute slurred speech in context of past left sided weakness secondary to right CVA.  She is acutely weaker, though the speech has returned to baseline.  Will call EMS for acute transfer "code stroke"  This chart was scribed in my presence and reviewed by me personally.    ICD-9-CM ICD-10-CM   1. Slurred speech 784.59 R47.81   2. Other chest pain 786.59 R07.89 EKG 12-Lead     aspirin chewable tablet 324 mg  3. Left-sided  weakness 728.87 M62.89   4.  Abdominal pain, epigastric 789.06 R10.13      By signing my name below, I, Moises Blood, attest that this documentation has been prepared under the direction and in the presence of Robyn Haber, MD. Electronically Signed: Moises Blood, [place]. 12/16/2014 , 11:09 AM .  Signed, Robyn Haber, MD 12/16/2014 11:09 AM

## 2014-12-16 NOTE — ED Provider Notes (Signed)
CSN: [id number]     Arrival date & time 12/16/14  1201 History   First MD Initiated Contact with Patient 12/16/14 1205     Chief Complaint  Patient presents with  . Cerebrovascular Accident     (Consider location/radiation/quality/duration/timing/severity/associated sxs/prior Treatment) Patient is a 81 y.o. female presenting with Acute Neurological Problem. The history is provided by the patient.  Cerebrovascular Accident Pertinent negatives include no abdominal pain and no shortness of breath.   patient with difficulty speaking and generalized weakness. Reportedly was normal at 4:30 in the morning. Woke up and is having difficulty speaking and potentially some left-sided weakness. Went to see urgent care and was transferred here according to notes emergently as a code stroke. Discussed with EMS who states they called [facility] who then said that they were out of the window and would not activate code stroke. Upon arrival to speech is gone back to normal. Has had a recent stroke and has has some chronic left-sided weakness. Was seen in the ER recently diagnosed UTI. No headache. States she just feels bad all over. Code stroke called since I saw the patient at around 7-1/2 hours from last normal hands since the deficits were not severe enough at that time to necessitate intervention.  Past Medical History  Diagnosis Date  . Sjogren's syndrome ([place])   . Dry eye syndrome   . Hypertension, benign   . Mitral valve prolapse   . GERD (gastroesophageal reflux disease)   . Diverticulosis of colon   . Irritable bowel syndrome   . Urinary incontinence   . Low back pain syndrome   . Fibromyalgia   . Memory loss   . Anxiety and depression   . History of adverse drug reaction   . Peripheral neuropathy (HCC)     "both feet and legs"  . Shortness of breath 07/18/11    "alot lately"  . Anginal pain ([place])   . History of recurrent UTIs   . H/O hiatal hernia   . Anxiety   . Dementia   . Depression    . Abnormality of gait   . Thyroid nodule   . Headache(784.0) 09/05/2012  . Adenomatous polyp of colon 2002    13mm  . History of cerebrovascular disease 09/24/2014   Past Surgical History  Procedure Laterality Date  . Vesicovaginal fistula closure w/ tah    . Appendectomy    . Cholecystectomy  2000  . Mandible surgery    . Temporomandibular joint surgery  1986    Dr. Terence Lux  . Cataract extraction, bilateral    . Abdominal hysterectomy  1967  . Dental surgery      multiple tooth extractions  . Esophagogastroduodenoscopy (egd) with esophageal dilation N/A 08/23/2012    Procedure: ESOPHAGOGASTRODUODENOSCOPY (EGD) WITH ESOPHAGEAL DILATION;  Surgeon: Milus Banister, MD;  Location: [facility];  Service: Endoscopy;  Laterality: N/A;  . Colonoscopy w/ biopsies      multiple  . Nasal septum surgery  1980  . Transthoracic echocardiogram  2001    mild LVH, normal LV  . Nm myocar perf wall motion  2003    persantine - normal static and dynamic study w/apical thinning and presvered LV function, no ischemia  . Cardiac catheterization  02/17/2003    normal L main, LAD free of disease, Cfx free of disease, RCA free of disease (Dr. Rockne Menghini)   Family History  Problem Relation Age of Onset  . Heart disease Father     heart attack  .  Pneumonia Mother   . Heart attack Mother   . Hypertension Mother   . Hypertension Maternal Grandmother   . Colon cancer Sister   . Kidney disease Daughter   . Asthma Daughter   . Arthritis Daughter 25    osteo,  . Heart disease Son 14    stage 3 CHF(Diastolic /Systolic)  . Throat cancer Brother    Social History  Substance Use Topics  . Smoking status: Former Research scientist (life sciences)  . Smokeless tobacco: Never Used     Comment: Quit at age 57   . Alcohol Use: No   OB History    No data available     Review of Systems  Constitutional: Positive for appetite change and fatigue.  Respiratory: Negative for shortness of breath.   Gastrointestinal: Negative for  abdominal pain.  Musculoskeletal: Negative for back pain.  Skin: Negative for wound.  Neurological: Positive for speech difficulty and weakness.      Allergies  Banana; Codeine; Klonopin; Meperidine hcl; Norflex; Oxycodone-acetaminophen; Propoxyphene hcl; Zoloft; Doxycycline; Naproxen; Penicillins; Phenothiazines; Stelazine; Sulfamethoxazole-trimethoprim; Tolectin; and Tramadol  Home Medications   Prior to Admission medications   Medication Sig Start Date End Date Taking? Authorizing Provider  ALPRAZolam Duanne Moron) 0.5 MG tablet Take 1 tablet (0.5 mg total) by mouth 2 (two) times daily as needed for anxiety. 12/07/14  Yes Reyne Dumas, MD  aspirin 325 MG tablet Take 1 tablet (325 mg total) by mouth daily. 07/28/14  Yes Reyne Dumas, MD  bisacodyl (BISACODYL) 5 MG EC tablet Take 2 tablets (10 mg total) by mouth daily as needed for moderate constipation. If no effective bowel movement every 2- 3 days Patient taking differently: Take 5-10 mg by mouth daily as needed for moderate constipation. If no effective bowel movement every 2- 3 days 07/08/14  Yes Gatha Mayer, MD  CALCIUM CARBONATE PO Take 1 tablet by mouth daily.   Yes Historical Provider, MD  carboxymethylcellulose (REFRESH TEARS) 0.5 % SOLN Place 1 drop into both eyes 2 (two) times daily as needed (dry eyes).    Yes Historical Provider, MD  cetirizine (ZYRTEC) 10 MG tablet Take 1 tablet (10 mg total) by mouth daily. 03/23/14  Yes Tiffany L Reed, DO  CRANBERRY SOFT PO Take 1 capsule by mouth daily.   Yes Historical Provider, MD  diltiazem (CARDIZEM CD) 180 MG 24 hr capsule Take 180 mg by mouth daily.   Yes Historical Provider, MD  DULoxetine (CYMBALTA) 30 MG capsule Take one tablet  once daily in the morning for depression Patient taking differently: Take 30 mg by mouth every morning. Take one tablet  once daily in the morning for depression 09/24/14  Yes Kathrynn Ducking, MD  DULoxetine (CYMBALTA) 60 MG capsule Take one tablet by mouth in  the evening once daily for depression Patient taking differently: Take 60 mg by mouth every evening. Take one tablet by mouth in the evening once daily for depression 09/24/14  Yes Kathrynn Ducking, MD  HYDROcodone-acetaminophen (NORCO/VICODIN) 5-325 MG tablet Take 1 tablet by mouth every 6 (six) hours as needed for moderate pain. Take one tablet twice daily for pain 12/07/14  Yes Reyne Dumas, MD  hydroxychloroquine (PLAQUENIL) 200 MG tablet Take 200 mg by mouth daily.    Yes Historical Provider, MD  lactose free nutrition (BOOST PLUS) LIQD Take 237 mLs by mouth 2 (two) times daily between meals. 07/22/13  Yes Maryann Mikhail, DO  lansoprazole (PREVACID) 30 MG capsule TAKE ONE CAPSULE BY MOUTH EVERY DAY AT 12  NOON 08/21/14  Yes Tiffany L Reed, DO  lubiprostone (AMITIZA) 24 MCG capsule Take 1 capsule (24 mcg total) by mouth 2 (two) times daily with a meal. 07/08/14  Yes Gatha Mayer, MD  LYRICA 75 MG capsule TAKE ONE CAPSULE BY MOUTH TWICE A DAY 11/13/14  Yes Tiffany L Reed, DO  memantine (NAMENDA XR) 28 MG CP24 24 hr capsule Take one capsule by mouth once daily to preserve memory Patient taking differently: Take 28 mg by mouth daily. Take one capsule by mouth once daily to preserve memory 11/13/14  Yes Tiffany L Reed, DO  metoprolol (LOPRESSOR) 50 MG tablet Take 1 tablet (50 mg total) by mouth 2 (two) times daily. 07/03/14  Yes Pixie Casino, MD  nystatin (MYCOSTATIN) 100000 UNIT/ML suspension TAKE 1 TEASPOONFUL BY MOUTH 4 TIMES A DAY as needed for mouth pain Patient taking differently: Take 5 mLs by mouth 4 (four) times daily as needed (for mouth pain).  09/04/14  Yes Tiffany L Reed, DO  pilocarpine (SALAGEN) 5 MG tablet Take 5 mg by mouth 2 (two) times daily.   Yes Historical Provider, MD  pravastatin (PRAVACHOL) 20 MG tablet Take 1 tablet (20 mg total) by mouth daily at 6 PM. 10/23/14  Yes Tiffany L Reed, DO  sodium chloride (OCEAN) 0.65 % SOLN nasal spray Place 1 spray into the nose as needed for  congestion. 08/23/12  Yes Modena Jansky, MD  triamcinolone ointment (KENALOG) 0.1 % Apply 1 application topically 2 (two) times daily as needed (rash).  04/01/14  Yes Historical Provider, MD   BP 135/58 mmHg  Pulse 52  Resp 14  SpO2 98% Physical Exam  Constitutional: She appears well-developed and well-nourished.  Eyes: Pupils are equal, round, and reactive to light.  Neck: Neck supple.  Cardiovascular: Normal rate and regular rhythm.   Pulmonary/Chest: Effort normal.  Abdominal: Soft. There is no tenderness.  Neurological: She is alert.  Good grip strength bilaterally. Good flexion-extension and feet. Some swelling on the left hand compared to the right. Extraocular movements intact. Patient is awake and able to answer questions. Speech does not appear slurred at this time.  Skin: Skin is warm.    ED Course  Procedures (including critical care time) Labs Review Labs Reviewed  URINALYSIS, ROUTINE W REFLEX MICROSCOPIC (NOT AT [facility]) - Abnormal; Notable for the following:    Leukocytes, UA TRACE (*)    All other components within normal limits  CBC - Abnormal; Notable for the following:    RBC 3.70 (*)    Hemoglobin 11.0 (*)    HCT 33.6 (*)    All other components within normal limits  COMPREHENSIVE METABOLIC PANEL - Abnormal; Notable for the following:    Glucose, Bld 114 (*)    Total Protein 6.0 (*)    Albumin 3.3 (*)    All other components within normal limits  URINE RAPID DRUG SCREEN, HOSP PERFORMED - Abnormal; Notable for the following:    Opiates POSITIVE (*)    Benzodiazepines POSITIVE (*)    All other components within normal limits  I-STAT CHEM 8, ED - Abnormal; Notable for the following:    Chloride 100 (*)    Glucose, Bld 107 (*)    Hemoglobin 11.9 (*)    HCT 35.0 (*)    All other components within normal limits  CBG MONITORING, ED - Abnormal; Notable for the following:    Glucose-Capillary 110 (*)    All other components within normal limits  ETHANOL  PROTIME-INR  APTT  DIFFERENTIAL  URINE MICROSCOPIC-ADD ON  I-STAT TROPOININ, ED    Imaging Review Ct Head Wo Contrast  12/16/2014  CLINICAL DATA:  Weakness EXAM: CT HEAD WITHOUT CONTRAST TECHNIQUE: Contiguous axial images were obtained from the base of the skull through the vertex without intravenous contrast. COMPARISON:  12/04/2014 FINDINGS: No skull fracture is noted. No intracranial hemorrhage, mass effect or midline shift. No acute cortical infarction. No mass lesion is noted on this unenhanced scan. Stable atrophy and chronic white matter disease. Stable old infarct in right parietal and right occipital lobe. IMPRESSION: No acute intracranial abnormality. Stable atrophy and chronic white matter disease. Stable old infarct in right parietal and right occipital lobe. Electronically Signed   By: Lahoma Crocker M.D.   On: 12/16/2014 13:06   Dg Chest Portable 1 View  12/16/2014  CLINICAL DATA:  Confusion and hypertension. EXAM: PORTABLE CHEST 1 VIEW COMPARISON:  December 03, 2014 FINDINGS: There is scarring in the left base region, stable. There is no edema or consolidation. Heart is borderline enlarged with pulmonary vascularity within normal limits. No adenopathy. There is degenerative change in the thoracic spine. IMPRESSION: Scarring left base. No edema or consolidation. No change in cardiac silhouette. Electronically Signed   By: Lowella Grip III M.D.   On: 12/16/2014 12:58   I have personally reviewed and evaluated these images and lab results as part of my medical decision-making.   EKG Interpretation   Date/Time:  Wednesday December 16 2014 12:17:58 EDT Ventricular Rate:  97 PR Interval:  162 QRS Duration: 222 QT Interval:  422 QTC Calculation: 536 R Axis:   170 Text Interpretation:  Sinus rhythm LAE, consider biatrial enlargement RBBB  and LPFB Confirmed by Alvino Chapel  MD, Ovid Curd [phone]) on 12/16/2014 12:54:59  PM      MDM   Final diagnoses:  Difficulty speaking     Patient with difficulty speaking. Also with some left-sided weakness. Resolved upon arrival but does feel generally weak. Has had previous stroke. Urinalysis looks reassuring. Will admit to internal medicine.    Davonna Belling, MD 12/16/14 [phone]

## 2014-12-16 NOTE — Progress Notes (Signed)
Pt arrived to room 5M06 from ED. Pt is alert and oriented.  Safety measures in place.  Will continue to monitor.  Fredrich Romans, RN

## 2014-12-16 NOTE — ED Notes (Signed)
Pt is NPO.

## 2014-12-16 NOTE — ED Notes (Signed)
Per EMS: [facility] from [facility] c/o stroke sx that seem to be resolved; pt with slurred speech and left sided weakness; pt with hx of stroke with left sided deficits; pt woke and was normal at 0430 and then unable to ambulate at 0900 per caregiver; IV 20 R AC; pt sts HA to right side of head

## 2014-12-16 NOTE — H&P (Addendum)
[facility] History and Physical  Ariana [name] WUJ:[id number] DOB: [birth year] DOA: 12/16/2014  Referring physician: Dr Alvino Chapel - [facility] PCP: Hollace Kinnier, DO   Chief Complaint: Difficulty speaking  HPI: Ariana [name] is a 81 y.o. female  Acute onset difficulty speaking and left-sided weakness started at 9 AM this morning. Associated with chest discomfort which is constant. Nothing makes her symptoms better or worse patient is not tried nothing for her symptoms. Chest pain is unchanged with deep breathing or palpation of the chest wall. Patient states she is unable to sit unassisted. States that she may also have some weakness on the right. Associated headaches and palpitations. Overall symptoms are improving.   Review of Systems:  Constitutional:  No weight loss, night sweats, Fevers, chills, HEENT:  No Difficulty swallowing,Tooth/dental problems,Sore throat, Cardio-vascular: [place] HPI GI:  No heartburn, indigestion, abdominal pain, nausea, vomiting, diarrhea, change in bowel habits, loss of appetite  Resp:   No shortness of breath with exertion or at rest. No excess mucus, no productive cough, No non-productive cough, No coughing up of blood.No change in color of mucus.No wheezing.No chest wall deformity  Skin:  no rash or lesions.  GU:  no dysuria, change in color of urine, no urgency or frequency. No flank pain.  Musculoskeletal:   Per H{PI Psych:  No change in mood or affect. No depression or anxiety. No memory loss.  Neuro:  Per HPI All other systems were reviewed and are negative.  Past Medical History  Diagnosis Date  . Sjogren's syndrome ([place])   . Dry eye syndrome   . Hypertension, benign   . Mitral valve prolapse   . GERD (gastroesophageal reflux disease)   . Diverticulosis of colon   . Irritable bowel syndrome   . Urinary incontinence   . Low back pain syndrome   . Fibromyalgia   . Memory loss   . Anxiety and depression   . History of adverse drug  reaction   . Peripheral neuropathy (HCC)     "both feet and legs"  . Shortness of breath 07/18/11    "alot lately"  . Anginal pain ([place])   . History of recurrent UTIs   . H/O hiatal hernia   . Anxiety   . Dementia   . Depression   . Abnormality of gait   . Thyroid nodule   . Headache(784.0) 09/05/2012  . Adenomatous polyp of colon 2002    15mm  . History of cerebrovascular disease 09/24/2014   Past Surgical History  Procedure Laterality Date  . Vesicovaginal fistula closure w/ tah    . Appendectomy    . Cholecystectomy  2000  . Mandible surgery    . Temporomandibular joint surgery  1986    Dr. Terence Lux  . Cataract extraction, bilateral    . Abdominal hysterectomy  1967  . Dental surgery      multiple tooth extractions  . Esophagogastroduodenoscopy (egd) with esophageal dilation N/A 08/23/2012    Procedure: ESOPHAGOGASTRODUODENOSCOPY (EGD) WITH ESOPHAGEAL DILATION;  Surgeon: Milus Banister, MD;  Location: [facility];  Service: Endoscopy;  Laterality: N/A;  . Colonoscopy w/ biopsies      multiple  . Nasal septum surgery  1980  . Transthoracic echocardiogram  2001    mild LVH, normal LV  . Nm myocar perf wall motion  2003    persantine - normal static and dynamic study w/apical thinning and presvered LV function, no ischemia  . Cardiac catheterization  02/17/2003    normal L  main, LAD free of disease, Cfx free of disease, RCA free of disease (Dr. Rockne Menghini)   Social History:  reports that she has quit smoking. She has never used smokeless tobacco. She reports that she does not drink alcohol or use illicit drugs.  Allergies  Allergen Reactions  . Banana Nausea And Vomiting  . Codeine Nausea Only    unless given with Phenergan  . Klonopin [Clonazepam] Other (See Comments)    Causes hallucination   . Meperidine Hcl Nausea Only    unless given with Phenergan  . Norflex [Orphenadrine Citrate] Nausea Only    Unless given with Phenergan  . Oxycodone-Acetaminophen Nausea Only     unless given with phenergan  . Propoxyphene Hcl Nausea Only    unless given with phenergan  . Zoloft [Sertraline Hcl] Other (See Comments)    Caused lethargy  . Doxycycline Other (See Comments)    Unknown  . Naproxen Other (See Comments)  . Penicillins Other (See Comments)    Unknown  . Phenothiazines Other (See Comments)    Unknown  . Stelazine Other (See Comments)    Unknown  . Sulfamethoxazole-Trimethoprim Other (See Comments)    Unknown  . Tolectin [Tolmetin Sodium] Other (See Comments)    Unknown  . Tramadol Other (See Comments)    Unknown    Family History  Problem Relation Age of Onset  . Heart disease Father     heart attack  . Pneumonia Mother   . Heart attack Mother   . Hypertension Mother   . Hypertension Maternal Grandmother   . Colon cancer Sister   . Kidney disease Daughter   . Asthma Daughter   . Arthritis Daughter 29    osteo,  . Heart disease Son 23    stage 3 CHF(Diastolic /Systolic)  . Throat cancer Brother      Prior to Admission medications   Medication Sig Start Date End Date Taking? Authorizing Provider  ALPRAZolam Duanne Moron) 0.5 MG tablet Take 1 tablet (0.5 mg total) by mouth 2 (two) times daily as needed for anxiety. 12/07/14  Yes Reyne Dumas, MD  aspirin 325 MG tablet Take 1 tablet (325 mg total) by mouth daily. 07/28/14  Yes Reyne Dumas, MD  bisacodyl (BISACODYL) 5 MG EC tablet Take 2 tablets (10 mg total) by mouth daily as needed for moderate constipation. If no effective bowel movement every 2- 3 days Patient taking differently: Take 5-10 mg by mouth daily as needed for moderate constipation. If no effective bowel movement every 2- 3 days 07/08/14  Yes Gatha Mayer, MD  CALCIUM CARBONATE PO Take 1 tablet by mouth daily.   Yes Historical Provider, MD  carboxymethylcellulose (REFRESH TEARS) 0.5 % SOLN Place 1 drop into both eyes 2 (two) times daily as needed (dry eyes).    Yes Historical Provider, MD  cephALEXin (KEFLEX) 500 MG capsule  Take 500 mg by mouth 3 (three) times daily.   Yes Historical Provider, MD  cetirizine (ZYRTEC) 10 MG tablet Take 1 tablet (10 mg total) by mouth daily. 03/23/14  Yes Tiffany L Reed, DO  CRANBERRY SOFT PO Take 1 capsule by mouth daily.   Yes Historical Provider, MD  diltiazem (CARDIZEM CD) 180 MG 24 hr capsule Take 180 mg by mouth daily.   Yes Historical Provider, MD  DULoxetine (CYMBALTA) 30 MG capsule Take one tablet  once daily in the morning for depression Patient taking differently: Take 30 mg by mouth every morning. Take one tablet  once daily in  the morning for depression 09/24/14  Yes Kathrynn Ducking, MD  DULoxetine (CYMBALTA) 60 MG capsule Take one tablet by mouth in the evening once daily for depression Patient taking differently: Take 60 mg by mouth every evening. Take one tablet by mouth in the evening once daily for depression 09/24/14  Yes Kathrynn Ducking, MD  HYDROcodone-acetaminophen (NORCO/VICODIN) 5-325 MG tablet Take 1 tablet by mouth every 6 (six) hours as needed for moderate pain. Take one tablet twice daily for pain 12/07/14  Yes Reyne Dumas, MD  hydroxychloroquine (PLAQUENIL) 200 MG tablet Take 200 mg by mouth daily.    Yes Historical Provider, MD  lactose free nutrition (BOOST PLUS) LIQD Take 237 mLs by mouth 2 (two) times daily between meals. 07/22/13  Yes Maryann Mikhail, DO  lansoprazole (PREVACID) 30 MG capsule TAKE ONE CAPSULE BY MOUTH EVERY DAY AT 12 NOON 08/21/14  Yes Tiffany L Reed, DO  lubiprostone (AMITIZA) 24 MCG capsule Take 1 capsule (24 mcg total) by mouth 2 (two) times daily with a meal. 07/08/14  Yes Gatha Mayer, MD  LYRICA 75 MG capsule TAKE ONE CAPSULE BY MOUTH TWICE A DAY 11/13/14  Yes Tiffany L Reed, DO  memantine (NAMENDA XR) 28 MG CP24 24 hr capsule Take one capsule by mouth once daily to preserve memory Patient taking differently: Take 28 mg by mouth daily. Take one capsule by mouth once daily to preserve memory 11/13/14  Yes Tiffany L Reed, DO  metoprolol  (LOPRESSOR) 50 MG tablet Take 1 tablet (50 mg total) by mouth 2 (two) times daily. 07/03/14  Yes Pixie Casino, MD  nystatin (MYCOSTATIN) 100000 UNIT/ML suspension TAKE 1 TEASPOONFUL BY MOUTH 4 TIMES A DAY as needed for mouth pain Patient taking differently: Take 5 mLs by mouth 4 (four) times daily as needed (for mouth pain).  09/04/14  Yes Tiffany L Reed, DO  pilocarpine (SALAGEN) 5 MG tablet Take 5 mg by mouth 2 (two) times daily.   Yes Historical Provider, MD  pravastatin (PRAVACHOL) 20 MG tablet Take 1 tablet (20 mg total) by mouth daily at 6 PM. 10/23/14  Yes Tiffany L Reed, DO  sodium chloride (OCEAN) 0.65 % SOLN nasal spray Place 1 spray into the nose as needed for congestion. 08/23/12  Yes Modena Jansky, MD  triamcinolone ointment (KENALOG) 0.1 % Apply 1 application topically 2 (two) times daily as needed (rash).  04/01/14  Yes Historical Provider, MD   Physical Exam: Filed Vitals:   12/16/14 1445 12/16/14 1500 12/16/14 1515 12/16/14 1530  BP: 125/55 125/54 124/59 135/58  Pulse: 47 47 50 52  Resp: 13 11 14    SpO2: 94% 94% 95% 98%    Wt Readings from Last 3 Encounters:  12/10/14 70.308 kg (155 lb)  12/04/14 70.3 kg (154 lb 15.7 oz)  11/05/14 70.308 kg (155 lb)    General:  Appears calm and comfortable Eyes:  PERRL, EOMI, normal lids, iris ENT: dry mm Neck:  no LAD, masses or thyromegaly Cardiovascular:  RRR, no m/r/g. trace LE edema.  Respiratory:  CTA bilaterally, no w/r/r. Normal respiratory effort. Abdomen:  soft, ntnd Skin:  no rash or induration seen on limited exam Musculoskeletal: 4/5 strength in UE and LE symmetrically, no effusions,  Psychiatric:  grossly normal mood and affect, speech fluent and appropriate Neurologic:  CN 2-12 grossly intact, moves all extremities in coordinated fashion, no dysmetria, speech normal           Labs on Admission:  Basic Metabolic Panel:  Recent Labs Lab 12/10/14 1613 12/16/14 1223 12/16/14 1224  NA 128* 139 140  K 4.1 4.1  4.0  CL 87* 106 100*  CO2 26 27  --   GLUCOSE 83 114* 107*  BUN 23 15 18   CREATININE 0.82 0.71 0.70  CALCIUM 9.7 9.1  --    Liver Function Tests:  Recent Labs Lab 12/16/14 1223  AST 24  ALT 20  ALKPHOS 74  BILITOT 0.3  PROT 6.0*  ALBUMIN 3.3*   No results for input(s): LIPASE, AMYLASE in the last 168 hours. No results for input(s): AMMONIA in the last 168 hours. CBC:  Recent Labs Lab 12/10/14 1613 12/16/14 1223 12/16/14 1224  WBC 5.1 5.1  --   NEUTROABS 2.7 2.5  --   HGB  --  11.0* 11.9*  HCT 35.2 33.6* 35.0*  MCV  --  90.8  --   PLT  --  225  --    Cardiac Enzymes: No results for input(s): CKTOTAL, CKMB, CKMBINDEX, TROPONINI in the last 168 hours.  BNP (last 3 results) No results for input(s): BNP in the last 8760 hours.  ProBNP (last 3 results) No results for input(s): PROBNP in the last 8760 hours.  CBG:  Recent Labs Lab 12/16/14 1213  GLUCAP 110*    Radiological Exams on Admission: Ct Head Wo Contrast  12/16/2014  CLINICAL DATA:  Weakness EXAM: CT HEAD WITHOUT CONTRAST TECHNIQUE: Contiguous axial images were obtained from the base of the skull through the vertex without intravenous contrast. COMPARISON:  12/04/2014 FINDINGS: No skull fracture is noted. No intracranial hemorrhage, mass effect or midline shift. No acute cortical infarction. No mass lesion is noted on this unenhanced scan. Stable atrophy and chronic white matter disease. Stable old infarct in right parietal and right occipital lobe. IMPRESSION: No acute intracranial abnormality. Stable atrophy and chronic white matter disease. Stable old infarct in right parietal and right occipital lobe. Electronically Signed   By: Lahoma Crocker M.D.   On: 12/16/2014 13:06   Dg Chest Portable 1 View  12/16/2014  CLINICAL DATA:  Confusion and hypertension. EXAM: PORTABLE CHEST 1 VIEW COMPARISON:  December 03, 2014 FINDINGS: There is scarring in the left base region, stable. There is no edema or consolidation.  Heart is borderline enlarged with pulmonary vascularity within normal limits. No adenopathy. There is degenerative change in the thoracic spine. IMPRESSION: Scarring left base. No edema or consolidation. No change in cardiac silhouette. Electronically Signed   By: Lowella Grip III M.D.   On: 12/16/2014 12:58     Assessment/Plan Active Problems:   HYPERTENSION, BENIGN   GERD   Irritable bowel syndrome   SJOGREN'S SYNDROME   Fibromyalgia   Mild dementia   Difficulty speaking   Paroxysmal a-fib (HCC)   Left-sided weakness   Chest pain  L sided weakness and difficulty speaking: Resolved at time of admission. Of note patient with history of multiple strokes and TIAs. Most recent workup in June 2016. MRI/MRA reviewed and further studies not warranted. Patient remains in sinus rhythm and last echo approximately 2 weeks ago. Patient is not an anticoagulation candidate due to bleed risk. - Telemetry - PT/OT - Neuro checks - cont ASA 325  CP: likely GI as constant and worse w/ meals - cycle trop - GI cocktail - tele  HTN: Normotensive. Bradycardia at baseline.  - continue in am cardizem, metoprolol due to paroxysmal Afib w/ RVR in past  HLD: - continue statin  Sjogrens: - continue plicarpine  fibromyalgia: -  conitnue lyrica  Depression/Anxiety: - continue cymbalta, xanax  Dementia: at baseline - cont namenda  IBS: - con amitiza   Code Status: FULL  DVT Prophylaxis: Hep Family Communication: daughter and caregiver Disposition Plan:  Pending Improvement - LIKELY OBS    MERRELL, DAVID J, MD Family Medicine [facility] [email or web address] Password [email or web address]

## 2014-12-17 DIAGNOSIS — R079 Chest pain, unspecified: Secondary | ICD-10-CM

## 2014-12-17 DIAGNOSIS — G459 Transient cerebral ischemic attack, unspecified: Principal | ICD-10-CM

## 2014-12-17 DIAGNOSIS — I480 Paroxysmal atrial fibrillation: Secondary | ICD-10-CM

## 2014-12-17 DIAGNOSIS — I10 Essential (primary) hypertension: Secondary | ICD-10-CM

## 2014-12-17 DIAGNOSIS — F0390 Unspecified dementia without behavioral disturbance: Secondary | ICD-10-CM

## 2014-12-17 DIAGNOSIS — K219 Gastro-esophageal reflux disease without esophagitis: Secondary | ICD-10-CM | POA: Diagnosis not present

## 2014-12-17 DIAGNOSIS — M797 Fibromyalgia: Secondary | ICD-10-CM

## 2014-12-17 HISTORY — DX: Transient cerebral ischemic attack, unspecified: G45.9

## 2014-12-17 LAB — BASIC METABOLIC PANEL
ANION GAP: 10 (ref 5–15)
BUN: 12 mg/dL (ref 6–20)
CALCIUM: 9.3 mg/dL (ref 8.9–10.3)
CO2: 27 mmol/L (ref 22–32)
Chloride: 103 mmol/L (ref 101–111)
Creatinine, Ser: 0.7 mg/dL (ref 0.44–1.00)
Glucose, Bld: 89 mg/dL (ref 65–99)
Potassium: 3.9 mmol/L (ref 3.5–5.1)
SODIUM: 140 mmol/L (ref 135–145)

## 2014-12-17 LAB — TROPONIN I: Troponin I: <0.03 ng/mL

## 2014-12-17 LAB — LIPID PANEL
Cholesterol: 121 mg/dL (ref 0–200)
HDL: 45 mg/dL
LDL Cholesterol: 60 mg/dL (ref 0–99)
Total CHOL/HDL Ratio: 2.7 ratio
Triglycerides: 78 mg/dL
VLDL: 16 mg/dL (ref 0–40)

## 2014-12-17 LAB — CBC
HCT: 35.5 % — ABNORMAL LOW (ref 36.0–46.0)
Hemoglobin: 11.6 g/dL — ABNORMAL LOW (ref 12.0–15.0)
MCH: 29.5 pg (ref 26.0–34.0)
MCHC: 32.7 g/dL (ref 30.0–36.0)
MCV: 90.3 fL (ref 78.0–100.0)
PLATELETS: 241 10*3/uL (ref 150–400)
RBC: 3.93 MIL/uL (ref 3.87–5.11)
RDW: 13.1 % (ref 11.5–15.5)
WBC: 4.3 10*3/uL (ref 4.0–10.5)

## 2014-12-17 MED ORDER — DIPHENHYDRAMINE HCL 50 MG/ML IJ SOLN
25.0000 mg | Freq: Once | INTRAMUSCULAR | Status: AC
  Administered 2014-12-17: 25 mg via INTRAVENOUS
  Filled 2014-12-17: qty 1

## 2014-12-17 MED ORDER — KETOROLAC TROMETHAMINE 15 MG/ML IJ SOLN
15.0000 mg | Freq: Once | INTRAMUSCULAR | Status: AC
  Administered 2014-12-17: 15 mg via INTRAVENOUS
  Filled 2014-12-17: qty 1

## 2014-12-17 MED ORDER — METOCLOPRAMIDE HCL 5 MG/ML IJ SOLN
10.0000 mg | Freq: Once | INTRAMUSCULAR | Status: AC
  Administered 2014-12-17: 10 mg via INTRAVENOUS
  Filled 2014-12-17: qty 2

## 2014-12-17 NOTE — Progress Notes (Signed)
Occupational Therapy Evaluation Patient Details Name: Ariana [name] MRN: [id number] DOB: 30-Dec-1929 Today's Date: 12/17/2014    History of Present Illness Pt is an 81 year old female admitted with left sided weakness and speech difficulties.  + for Sjogren's, GERD, fibromyalgia, SOB, MVP. CT head 11/2 negative for acute event- old right parietal/occiiptal cva. MRI pending, no findings on chest CXR.     Clinical Impression   Pt admitted with the above diagnoses and presents with below problem list. Pt will benefit from continued acute OT to address the below listed deficits and maximize independence with BADLs. PTA pt was mod I with ADLs. Pt is currently min to mod A with LB ADLs and functional transfers. Session details below. OT to continue to follow acutely.     Follow Up Recommendations  Home health OT;Supervision - Intermittent;Other (comment) (OOB/mobility)    Equipment Recommendations  None recommended by OT    Recommendations for Other Services       Precautions / Restrictions Precautions Precautions: Fall Precaution Comments: h/o memory loss and confusion Restrictions Weight Bearing Restrictions: No      Mobility Bed Mobility Overal bed mobility: Needs Assistance Bed Mobility: Sit to Supine     Supine to sit: Min assist Sit to supine: Min guard   General bed mobility comments: Pt was able to transition to EOB with assist for some scooting, and to elevate trunk to full sitting position.   Transfers Overall transfer level: Needs assistance Equipment used: Rolling walker (2 wheeled) Transfers: Sit to/from Stand Sit to Stand: Min assist;From elevated surface         General transfer comment: from 3n1, EOB assist for balance, cues for technique with rw    Balance Overall balance assessment: Needs assistance;History of Falls Sitting-balance support: No upper extremity supported;Feet supported Sitting balance-Leahy Scale: Fair     Standing balance  support: Bilateral upper extremity supported;During functional activity Standing balance-Leahy Scale: Poor Standing balance comment: external support needed                            ADL Overall ADL's : Needs assistance/impaired Eating/Feeding: Set up;Sitting   Grooming: Min guard;Standing;Wash/dry hands Grooming Details (indicate cue type and reason): educated on keeping rw in front of her Upper Body Bathing: Set up;Sitting;Cueing for compensatory techniques   Lower Body Bathing: Sit to/from stand;Moderate assistance   Upper Body Dressing : Set up;Sitting   Lower Body Dressing: Moderate assistance;Sitting/lateral leans   Toilet Transfer: Minimal assistance;Ambulation;BSC;RW   Toileting- Clothing Manipulation and Hygiene: Moderate assistance;Sit to/from stand   Tub/ Banker: Moderate assistance;Ambulation;Tub transfer;3 in 1;Rolling walker   Functional mobility during ADLs: Min guard;Rolling walker General ADL Comments: Pt received sitting on 3n1 over toilet. Pt needing min A for balance aspect of pericare in sit<>stnad with therapist completing pericare.      Vision     Perception     Praxis      Pertinent Vitals/Pain Pain Assessment: 0-10 Pain Score: 7  Pain Location: headache; right side of face "I had TMJ surgery" Pain Descriptors / Indicators: Aching Pain Intervention(s): Limited activity within patient's tolerance;Monitored during session;Repositioned     Hand Dominance Right   Extremity/Trunk Assessment Upper Extremity Assessment Upper Extremity Assessment: LUE deficits/detail;Generalized weakness LUE Deficits / Details: grossly 4/5 LUE Sensation: decreased light touch   Lower Extremity Assessment Lower Extremity Assessment: Defer to PT evaluation RLE Sensation: history of peripheral neuropathy LLE Deficits / Details: h/o CVA  w/ grossly 4/5 strength throughout LLE Sensation: history of peripheral neuropathy   Cervical / Trunk  Assessment Cervical / Trunk Assessment: Other exceptions Cervical / Trunk Exceptions: Forward head/rounded shoulders   Communication Communication Communication: No difficulties   Cognition Arousal/Alertness: Awake/alert Behavior During Therapy: WFL for tasks assessed/performed Overall Cognitive Status: No family/caregiver present to determine baseline cognitive functioning Area of Impairment: Memory   Current Attention Level: Sustained Memory: Decreased short-term memory   Safety/Judgement: Decreased awareness of safety;Decreased awareness of deficits Awareness: Emergent Problem Solving: Slow processing General Comments: Pt oriented to self, location, and time. Pt noted to be hyperverbal with perseveration noted. Needed redirection to tasks.   General Comments       Exercises       Shoulder Instructions      Home Living Family/patient expects to be discharged to:: Private residence Living Arrangements: Alone Available Help at Discharge: Personal care attendant;Available PRN/intermittently Type of Home: Other(Comment) ([facility]home) Home Access: Level entry     Home Layout: One level     Bathroom Shower/Tub: Tub/shower unit Shower/tub characteristics: Architectural technologist: Standard Bathroom Accessibility: Yes How Accessible: Accessible via walker Home Equipment: [place] - 4 wheels;Cane - single point;Grab bars - tub/shower;Bedside commode;Shower seat;Cane - quad          Prior Functioning/Environment Level of Independence: Needs assistance  Gait / Transfers Assistance Needed: Rollator for support ADL's / Homemaking Assistance Needed: Pt reports having aide 9a-5p M-F to A with meals, housework and tub transfers. Pt likely min A UB/LB bathing        OT Diagnosis: Generalized weakness   OT Problem List: Decreased strength;Decreased activity tolerance;Impaired balance (sitting and/or standing);Decreased safety awareness;Decreased knowledge of use of DME or  AE;Impaired UE functional use;Impaired sensation   OT Treatment/Interventions: Self-care/ADL training;Therapeutic exercise;DME and/or AE instruction;Therapeutic activities;Patient/family education;Balance training    OT Goals(Current goals can be found in the care plan section) Acute Rehab OT Goals Patient Stated Goal: to go home OT Goal Formulation: With patient Time For Goal Achievement: 12/24/14 Potential to Achieve Goals: Good ADL Goals Pt Will Perform Grooming: with modified independence;standing Pt Will Perform Lower Body Bathing: with modified independence;sit to/from stand Pt Will Perform Lower Body Dressing: with modified independence;sit to/from stand Pt Will Transfer to Toilet: with modified independence;ambulating Pt Will Perform Toileting - Clothing Manipulation and hygiene: with modified independence;sit to/from stand;sitting/lateral leans Pt Will Perform Tub/Shower Transfer: with modified independence;ambulating;shower seat Pt/caregiver will Perform Home Exercise Program: Increased strength;Left upper extremity;With written HEP provided;With theraband  OT Frequency: Min 2X/week   Barriers to D/C: Decreased caregiver support          Co-evaluation              End of Session Equipment Utilized During Treatment: Rolling walker;Gait belt  Activity Tolerance: Patient tolerated treatment well Patient left: in bed;with call bell/phone within reach;with bed alarm set   Time: 1224-1250 OT Time Calculation (min): 26 min Charges:  OT General Charges $OT Visit: 1 Procedure OT Evaluation $Initial OT Evaluation Tier I: 1 Procedure OT Treatments $Self Care/Home Management : 8-22 mins G-Codes: OT G-codes **NOT FOR INPATIENT CLASS** Functional Assessment Tool Used: clinical judgement Functional Limitation: Self care Self Care Current Status (V7858): At least 1 percent but less than 20 percent impaired, limited or restricted Self Care Goal Status (I5027): At least 1  percent but less than 20 percent impaired, limited or restricted  Hortencia Pilar 12/17/2014, 2:23 PM

## 2014-12-17 NOTE — Care Management (Signed)
This patient is currently active with [facility] I will follow for discharge needs. Thank you.  Christa See RN BSN [place] [phone]

## 2014-12-17 NOTE — Evaluation (Signed)
Speech Language Pathology Evaluation Patient Details Name: Ariana [name] MRN: [id number] DOB: 22-Jun-1929 Today's Date: 12/17/2014 Time: 0800-0830 SLP Time Calculation (min) (ACUTE ONLY): 30 min  Problem List:  Patient Active Problem List   Diagnosis Date Noted  . Difficulty speaking 12/16/2014  . Paroxysmal a-fib ([place]) 12/16/2014  . Left-sided weakness 12/16/2014  . Chest pain 12/16/2014  . Acute encephalopathy 12/04/2014  . Fall   . AKI (acute kidney injury) ([place]) 12/03/2014  . History of cerebrovascular disease 09/24/2014  . Falls frequently 07/28/2014  . Infarction of parietal lobe ([place])   . Mild dementia   . CVA (cerebral infarction) 07/26/2014  . Atrial fibrillation with RVR ([place]) 07/03/2014  . Constipation 04/15/2014  . Mixed stress and urge urinary incontinence 11/03/2013  . Therapeutic opioid induced constipation 11/03/2013  . Hemorrhoid 11/03/2013  . Low back pain associated with a spinal disorder other than radiculopathy or spinal stenosis 11/03/2013  . Protein-calorie malnutrition, severe ([place]) 07/22/2013  . UTI (lower urinary tract infection) 07/20/2013  . Prolonged QT interval 07/20/2013  . Osteopenia 07/17/2013  . Palpitations 06/30/2013  . Hereditary and idiopathic peripheral neuropathy 05/22/2013  . Abnormality of gait 12/05/2012  . Headache(784.0) 09/05/2012  . Multinodular thyroid 01/15/2012  . Neck pain 01/15/2012  . Senile dementia 07/14/2011  . Hypercholesterolemia 07/08/2010  . DRY EYE SYNDROME 07/06/2009  . [place] SYNDROME 07/06/2009  . Urge urinary incontinence 01/06/2009  . COLONIC POLYPS, ADENOMATOUS, HX OF 04/15/2008  . MITRAL VALVE PROLAPSE 11/05/2007  . ANXIETY DEPRESSION 07/02/2007  . Mononeuritis 07/02/2007  . HYPERTENSION, BENIGN 07/02/2007  . GERD 07/02/2007  . Irritable bowel syndrome 07/02/2007  . Fibromyalgia 07/02/2007   Past Medical History:  Past Medical History  Diagnosis Date  . Sjogren's syndrome ([place])   . Dry eye  syndrome   . Hypertension, benign   . Mitral valve prolapse   . GERD (gastroesophageal reflux disease)   . Diverticulosis of colon   . Irritable bowel syndrome   . Urinary incontinence   . Low back pain syndrome   . Fibromyalgia   . Memory loss   . Anxiety and depression   . History of adverse drug reaction   . Peripheral neuropathy ([facility])     "both feet and legs"  . Shortness of breath 07/18/11    "alot lately"  . Anginal pain ([place])   . History of recurrent UTIs   . H/O hiatal hernia   . Anxiety   . Dementia   . Depression   . Abnormality of gait   . Thyroid nodule   . Headache(784.0) 09/05/2012  . Adenomatous polyp of colon 2002    54mm  . History of cerebrovascular disease 09/24/2014   Past Surgical History:  Past Surgical History  Procedure Laterality Date  . Vesicovaginal fistula closure w/ tah    . Appendectomy    . Cholecystectomy  2000  . Mandible surgery    . Temporomandibular joint surgery  1986    Dr. Terence Lux  . Cataract extraction, bilateral    . Abdominal hysterectomy  1967  . Dental surgery      multiple tooth extractions  . Esophagogastroduodenoscopy (egd) with esophageal dilation N/A 08/23/2012    Procedure: ESOPHAGOGASTRODUODENOSCOPY (EGD) WITH ESOPHAGEAL DILATION;  Surgeon: Milus Banister, MD;  Location: [facility];  Service: Endoscopy;  Laterality: N/A;  . Colonoscopy w/ biopsies      multiple  . Nasal septum surgery  1980  . Transthoracic echocardiogram  2001    mild LVH, normal  LV  . Nm myocar perf wall motion  2003    persantine - normal static and dynamic study w/apical thinning and presvered LV function, no ischemia  . Cardiac catheterization  02/17/2003    normal L main, LAD free of disease, Cfx free of disease, RCA free of disease (Dr. Rockne Menghini)   HPI:  Pt is an 81 year old female admitted with left sided weakness and speech difficulties.  [place] + for Sjogren's, GERD, fibromyalgia, SOB, MVP.  CT head 11/2 negative for acute event- old right  parietal/occiiptal cva.  MRI  pending, no findings on chest CXR.    Assessment / Plan / Recommendation Clinical Impression  Pt presents with possible mild dysarthria and cognitive linguistic defictis consistent with rt hemisphere CVA.  Pt is verbose and has difficulty with turn taking, precise communication and processing information.  Pt benefited from moderate cues for appropriate turn taking.  She is able to follow 3 step commands and repeat sentences fluently.   Pt was oriented x4 = with self correction for year.  She did not recall speech deficits *per chart review* prior to admission.   SLP will follow up to establish baseline with family and will set goals for speech to decreased burden of care on caregivers.  Pt educated and agreeable to plan.      SLP Assessment  Patient needs continued Speech Lanaguage Pathology Services    Follow Up Recommendations  Home health SLP    Frequency and Duration min 2x/week  2 weeks   Pertinent Vitals/Pain Pain Assessment: 0-10 Pain Score: 5  Pain Location: right hip   SLP Goals  Progression toward goals: Progressing toward goals Patient/Family Stated Goal: to get some food Potential Considerations (ACUTE ONLY): Previous level of function  SLP Evaluation Prior Functioning  Type of Home: Other(Comment) Ariana [name]) Available Help at Discharge: Personal care attendant;Available PRN/intermittently   Cognition  Overall Cognitive Status: No family/caregiver present to determine baseline cognitive functioning Arousal/Alertness: Awake/alert Orientation Level: Oriented X4 Attention: Sustained Sustained Attention: Impaired Sustained Attention Impairment: Verbal complex Memory: Impaired (pt did not recall speech deficits prior to admission that were evident per chart review) Problem Solving: Appears intact (attempting to find call bell when SLP asked for pt to notify secretary of need to use RR) Safety/Judgment: Appears intact    Comprehension   Auditory Comprehension Overall Auditory Comprehension: Impaired Yes/No Questions: Not tested Commands: Within Functional Limits (up to 3 step commands - intact) Conversation: Complex Other Conversation Comments: complex conversation - breakdown noted, ? due to decreased attention Interfering Components: Attention;Processing speed EffectiveTechniques: Extra processing time;Repetition Visual Recognition/Discrimination Discrimination: Not tested Reading Comprehension Reading Status: Not tested (pt does not have her reading glasses present)    Expression Expression Primary Mode of Expression: Verbal Verbal Expression Overall Verbal Expression: Impaired Initiation: No impairment Repetition: No impairment Naming: Not tested Pragmatics: Impairment Impairments: Topic maintenance;Turn Taking;Abnormal affect Non-Verbal Means of Communication: Not applicable Written Expression Dominant Hand: Right Written Expression: Not tested   Oral / Motor Oral Motor/Sensory Function Overall Oral Motor/Sensory Function: Appears within functional limits for tasks assessed Motor Speech Overall Motor Speech: Impaired Phonation: Low vocal intensity Resonance: Within functional limits Articulation: Impaired Level of Impairment: Phrase Intelligibility: Intelligible Motor Planning: Witnin functional limits Motor Speech Errors: Not applicable   GO Functional Assessment Tool Used: clinical judgement Functional Limitations: Motor speech Motor Speech Current Status [phone]): At least 20 percent but less than 40 percent impaired, limited or restricted Motor Speech Goal Status [phone]): At least 1  percent but less than 20 percent impaired, limited or restricted   Luanna Salk, [facility] SLP [phone]

## 2014-12-17 NOTE — Evaluation (Signed)
Clinical/Bedside Swallow Evaluation Patient Details  Name: Ariana [name] MRN: [id number] Date of Birth: 05-Feb-1930  Today's Date: 12/17/2014 Time: SLP Start Time (ACUTE ONLY): 0831 SLP Stop Time (ACUTE ONLY): 0842 SLP Time Calculation (min) (ACUTE ONLY): 11 min  Past Medical History:  Past Medical History  Diagnosis Date  . Sjogren's syndrome ([place])   . Dry eye syndrome   . Hypertension, benign   . Mitral valve prolapse   . GERD (gastroesophageal reflux disease)   . Diverticulosis of colon   . Irritable bowel syndrome   . Urinary incontinence   . Low back pain syndrome   . Fibromyalgia   . Memory loss   . Anxiety and depression   . History of adverse drug reaction   . Peripheral neuropathy ([facility])     "both feet and legs"  . Shortness of breath 07/18/11    "alot lately"  . Anginal pain ([place])   . History of recurrent UTIs   . H/O hiatal hernia   . Anxiety   . Dementia   . Depression   . Abnormality of gait   . Thyroid nodule   . Headache(784.0) 09/05/2012  . Adenomatous polyp of colon 2002    49mm  . History of cerebrovascular disease 09/24/2014   Past Surgical History:  Past Surgical History  Procedure Laterality Date  . Vesicovaginal fistula closure w/ tah    . Appendectomy    . Cholecystectomy  2000  . Mandible surgery    . Temporomandibular joint surgery  1986    Dr. Terence Lux  . Cataract extraction, bilateral    . Abdominal hysterectomy  1967  . Dental surgery      multiple tooth extractions  . Esophagogastroduodenoscopy (egd) with esophageal dilation N/A 08/23/2012    Procedure: ESOPHAGOGASTRODUODENOSCOPY (EGD) WITH ESOPHAGEAL DILATION;  Surgeon: Milus Banister, MD;  Location: [facility];  Service: Endoscopy;  Laterality: N/A;  . Colonoscopy w/ biopsies      multiple  . Nasal septum surgery  1980  . Transthoracic echocardiogram  2001    mild LVH, normal LV  . Nm myocar perf wall motion  2003    persantine - normal static and dynamic study w/apical thinning  and presvered LV function, no ischemia  . Cardiac catheterization  02/17/2003    normal L main, LAD free of disease, Cfx free of disease, RCA free of disease (Dr. Rockne Menghini)   HPI:  Pt is an 81 year old female admitted with left sided weakness and speech difficulties.  [place] + for Sjogren's, GERD, fibromyalgia, SOB, MVP.  CT head 11/2 negative for acute event- old right parietal/occiiptal cva.  MRI  pending, no findings on chest CXR.    Assessment / Plan / Recommendation Clinical Impression  Pt with minimal oral dysphagia  - likely due to edentulous status and xerostomia from her Sjogren's.  CN exam unremarkable and pt without indication of significant dysphagia or airway compromise with po intake. Pt does not have dentures here - but she reports not consistently using them due to gum discomfort.  Pt also states she takes her medications with yogurt.  Encouraged pt to drink liquids t/o meal and ask family to bring in dentures if it may be helpful for her mastication abilities.  No SLP follow up needed re: swallow function.     Aspiration Risk  Mild    Diet Recommendation Dysphagia 3 (Mech soft);Thin   Medication Administration: Whole meds with puree (per pt) Compensations: Small sips/bites;Slow rate (consume liquids  t/o meal)    Other  Recommendations Oral Care Recommendations: Oral care BID   Follow Up Recommendations  Home health SLP    Frequency and Duration min 2x/week      Pertinent Vitals/Pain Afebrile, decreased      Swallow Study Prior Functional Status  Type of Home: Other(Comment) Ariana [name]) Available Help at Discharge: Personal care attendant;Available PRN/intermittently    General Date of Onset: 12/17/14 Other Pertinent Information: Pt is an 81 year old female admitted with left sided weakness and speech difficulties.  [place] + for Sjogren's, GERD, fibromyalgia, SOB, MVP.  CT head 11/2 negative for acute event- old right parietal/occiiptal cva.  MRI  pending, no findings  on chest CXR.  Type of Study: Bedside swallow evaluation Respiratory Status: Room air History of Recent Intubation: No Behavior/Cognition: Alert;Cooperative;Pleasant mood Oral Cavity - Dentition:  (dentures- not present but pt states family can bring them ) Self-Feeding Abilities: Able to feed self Patient Positioning: Upright in bed Baseline Vocal Quality: Hoarse Volitional Cough:  (did not test due to pt having to urinate) Volitional Swallow: Able to elicit    Oral/Motor/Sensory Function Overall Oral Motor/Sensory Function: Appears within functional limits for tasks assessed   Ice Chips Ice chips: Within functional limits   Thin Liquid Thin Liquid: Within functional limits    Nectar Thick Nectar Thick Liquid: Not tested   Honey Thick Honey Thick Liquid: Not tested   Puree Puree: Within functional limits   Solid   GO Functional Assessment Tool Used: clinical judgement Functional Limitations: Motor speech Motor Speech Current Status [phone]): At least 20 percent but less than 40 percent impaired, limited or restricted Motor Speech Goal Status [phone]): At least 1 percent but less than 20 percent impaired, limited or restricted  Solid: Impaired Oral Phase Impairments: Other (comment) (prolonged oral phase, but pt edentulous) Oral Phase Functional Implications: Oral holding       Luanna Salk, [place] [facility] SLP [phone]

## 2014-12-17 NOTE — Evaluation (Signed)
Physical Therapy Evaluation Patient Details Name: Ariana [name] MRN: [id number] DOB: [birth year] Today's Date: 12/17/2014   History of Present Illness  Pt is an 81 year old female admitted with left sided weakness and speech difficulties. [place] + for Sjogren's, GERD, fibromyalgia, SOB, MVP. CT head 11/2 negative for acute event- old right parietal/occiiptal cva. MRI pending, no findings on chest CXR.    Clinical Impression  Pt admitted with above diagnosis. Pt currently with functional limitations due to the deficits listed below (see PT Problem List). At the time of PT eval pt was able to perform transfers and ambulation with min assist and RW use. Pt confused and a poor historian. It appears that pt has an aide that assists her 5x/week but does not have 24 hour assistance. I do feel that pt would benefit from continued skilled therapy at the SNF level. If pt/family declines, will need 24 hour assist and home health PT to follow for safe d/c. Pt will benefit from skilled PT to increase their independence and safety with mobility to allow discharge to the venue listed below.       Follow Up Recommendations SNF;Supervision/Assistance - 24 hour    Equipment Recommendations  None recommended by PT    Recommendations for Other Services       Precautions / Restrictions Precautions Precautions: Fall Precaution Comments: h/o memory loss and confusion Restrictions Weight Bearing Restrictions: No      Mobility  Bed Mobility Overal bed mobility: Needs Assistance Bed Mobility: Supine to Sit     Supine to sit: Min assist     General bed mobility comments: Pt was able to transition to EOB with assist for some scooting, and to elevate trunk to full sitting position.   Transfers Overall transfer level: Needs assistance Equipment used: Rolling walker (2 wheeled) Transfers: Sit to/from Stand Sit to Stand: Min assist         General transfer comment: VC's for hand placement on seated  surface for safety. Pt was able to power-up to full stand with min assist for balance/support.   Ambulation/Gait Ambulation/Gait assistance: Min assist Ambulation Distance (Feet): 20 Feet Assistive device: Rolling walker (2 wheeled) Gait Pattern/deviations: Step-through pattern;Shuffle;Trunk flexed Gait velocity: Decreased Gait velocity interpretation: Below normal speed for age/gender General Gait Details: Assist for walker management. Pt generally steady with UE support, and required frequent redirection to task.  Stairs            Wheelchair Mobility    Modified Rankin (Stroke Patients Only)       Balance Overall balance assessment: Needs assistance;History of Falls Sitting-balance support: Feet supported;No upper extremity supported Sitting balance-Leahy Scale: Fair     Standing balance support: Bilateral upper extremity supported;During functional activity Standing balance-Leahy Scale: Poor Standing balance comment: Requires UE support for dynamic standing balance.                              Pertinent Vitals/Pain Pain Assessment: No/denies pain    Home Living Family/patient expects to be discharged to:: Private residence Living Arrangements: Alone Available Help at Discharge: Personal care attendant;Available PRN/intermittently Type of Home: Other(Comment) ([place]) Home Access: Level entry     Home Layout: One level Home Equipment: Walker - 4 wheels;Cane - single point;Grab bars - tub/shower;Bedside commode;Shower seat;Cane - quad      Prior Function Level of Independence: Needs assistance   Gait / Transfers Assistance Needed: Rollator for support  ADL's /  Homemaking Assistance Needed: Pt reports having aide 9a-5p M-F to [place] with meals, housework and tub transfers        Hand Dominance   Dominant Hand: Right    Extremity/Trunk Assessment   Upper Extremity Assessment: Defer to OT evaluation           Lower Extremity  Assessment: Generalized weakness   LLE Deficits / Details: h/o CVA w/ grossly 4/5 strength throughout  Cervical / Trunk Assessment: Other exceptions  Communication   Communication: No difficulties  Cognition Arousal/Alertness: Awake/alert Behavior During Therapy: WFL for tasks assessed/performed Overall Cognitive Status: No family/caregiver present to determine baseline cognitive functioning                 General Comments: Pt is an overall poor historian. Appears generally confused and is upset - insisting that someone came in and told her she was going home right now. Information above taken from previous admission on 12/05/14.    General Comments      Exercises        Assessment/Plan    PT Assessment Patient needs continued PT services  PT Diagnosis Difficulty walking;Generalized weakness;Abnormality of gait   PT Problem List Decreased strength;Decreased range of motion;Decreased activity tolerance;Decreased balance;Decreased mobility;Decreased cognition;Decreased knowledge of use of DME;Decreased safety awareness;Decreased knowledge of precautions;Impaired sensation  PT Treatment Interventions DME instruction;Gait training;Stair training;Functional mobility training;Therapeutic activities;Therapeutic exercise;Balance training;Neuromuscular re-education;Cognitive remediation;Patient/family education   PT Goals (Current goals can be found in the Care Plan section) Acute Rehab PT Goals Patient Stated Goal: to go home PT Goal Formulation: With patient Time For Goal Achievement: 12/31/14 Potential to Achieve Goals: Good    Frequency Min 2X/week   Barriers to discharge Decreased caregiver support Pt will not habe 24 hour assist at home.     Co-evaluation               End of Session Equipment Utilized During Treatment: Gait belt Activity Tolerance: Patient tolerated treatment well Patient left: in chair;with call bell/phone within reach;with chair alarm  set Nurse Communication: Mobility status;Precautions    Functional Assessment Tool Used: Clinical judgement Functional Limitation: Mobility: Walking and moving around Mobility: Walking and Moving Around Current Status (J4492): At least 20 percent but less than 40 percent impaired, limited or restricted Mobility: Walking and Moving Around Goal Status [phone]): At least 20 percent but less than 40 percent impaired, limited or restricted    Time: 1031-1049 PT Time Calculation (min) (ACUTE ONLY): 18 min   Charges:   PT Evaluation $Initial PT Evaluation Tier I: 1 Procedure     PT G Codes:   PT G-Codes **NOT FOR INPATIENT CLASS** Functional Assessment Tool Used: Clinical judgement Functional Limitation: Mobility: Walking and moving around Mobility: Walking and Moving Around Current Status (H2197): At least 20 percent but less than 40 percent impaired, limited or restricted Mobility: Walking and Moving Around Goal Status [phone]): At least 20 percent but less than 40 percent impaired, limited or restricted    Rolinda Roan 12/17/2014, 12:12 PM   Rolinda Roan, PT, DPT Acute Rehabilitation Services Pager: [phone]

## 2014-12-17 NOTE — Discharge Summary (Signed)
Physician Discharge Summary  Ariana [name] ZOX:[id number] DOB: [birth year] DOA: 12/16/2014  PCP: Hollace Kinnier, DO  Admit date: 12/16/2014 Discharge date: 12/17/2014  Time spent: 60 minutes  Recommendations for Outpatient Follow-up:  1. Will go home with home health PT  Discharge Condition: Stable   Discharge Diagnoses:  Principal Problem:   TIA (transient ischemic attack) Active Problems:   Chest pain   HYPERTENSION, BENIGN   GERD   Irritable bowel syndrome   SJOGREN'S SYNDROME   Fibromyalgia   Mild dementia   Paroxysmal a-fib (HCC)   History of present illness:  This is an 81 year old female with a history of TIAs, CVAs, A-fib who is not on anticoagulation due to frequent falls, HTN, GERD, Sjogern's syndrome, dementia, fibromyalgia who is sent to the hospital by her family for slurred speech and left-sided weakness. The patient has chronic left-sided weakness from prior CVAs. Upon arrival to the ER,  her symptoms had resolved. By the time she was seen by [facility] to be admitted, she had no neurological symptoms. He was admitted to be monitored overnight. As she has had an extensive workup for TIAs and CVAs just recently, no further workup has been ordered.  Hospital Course:  TIA? -per history, the patient had slurred speech and left sided weakness which resolved prior to coming into the ER- the patient herself does not recall having any of these symptoms and is not sure why she is in the hospital -Unfortunately, she is not a candidate for anticoagulation other than aspirin (which she is already taking) as she is a high fall risk -Neurological symptoms have not recurred during her hospital stay and she is stable to be discharged today  Deconditioning/frequent falls -PT eval completed today -it was recommended that the patient go to a skilled nursing facility and this has been discussed with the patient by myself-she states that she has been to rehabilitation in the past  and she feels that this time she does not need to go-she requests that she be discharged home and continue home health PT which she is already receiving  Chest pain -Most likely GI-resolved after GI cocktail-troponin negative  Hypertension -Continue metoprolol and Cardizem  Atrial fibrillation --Continue metoprolol and Cardizem-she is on aspirin 325 mg daily as she is a fall risk   Discharge Exam: Filed Weights   12/16/14 2036  Weight: 74.9 kg (165 lb 2 oz)   Filed Vitals:   12/17/14 1352  BP: 126/57  Pulse: 58  Temp: 98.1 F (36.7 C)  Resp: 16    General: AAO x 3, no distress Cardiovascular: RRR, no murmurs  Respiratory: clear to auscultation bilaterally GI: soft, non-tender, non-distended, bowel sound positive  Discharge Instructions You were cared for by a hospitalist during your hospital stay. If you have any questions about your discharge medications or the care you received while you were in the hospital after you are discharged, you can call the unit and asked to speak with the hospitalist on call if the hospitalist that took care of you is not available. Once you are discharged, your primary care physician will handle any further medical issues. Please note that NO REFILLS for any discharge medications will be authorized once you are discharged, as it is imperative that you return to your primary care physician (or establish a relationship with a primary care physician if you do not have one) for your aftercare needs so that they can reassess your need for medications and monitor your lab values.  Discharge Instructions    Diet - low sodium heart healthy    Complete by:  As directed      Increase activity slowly    Complete by:  As directed             Medication List    TAKE these medications        ALPRAZolam 0.5 MG tablet  Commonly known as:  XANAX  Take 1 tablet (0.5 mg total) by mouth 2 (two) times daily as needed for anxiety.     aspirin 325 MG  tablet  Take 1 tablet (325 mg total) by mouth daily.     bisacodyl 5 MG EC tablet  Commonly known as:  bisacodyl  Take 2 tablets (10 mg total) by mouth daily as needed for moderate constipation. If no effective bowel movement every 2- 3 days     CALCIUM CARBONATE PO  Take 1 tablet by mouth daily.     cephALEXin 500 MG capsule  Commonly known as:  KEFLEX  Take 500 mg by mouth 3 (three) times daily.     cetirizine 10 MG tablet  Commonly known as:  ZYRTEC  Take 1 tablet (10 mg total) by mouth daily.     CRANBERRY SOFT PO  Take 1 capsule by mouth daily.     diltiazem 180 MG 24 hr capsule  Commonly known as:  CARDIZEM CD  Take 180 mg by mouth daily.     DULoxetine 30 MG capsule  Commonly known as:  CYMBALTA  Take one tablet  once daily in the morning for depression     DULoxetine 60 MG capsule  Commonly known as:  CYMBALTA  Take one tablet by mouth in the evening once daily for depression     HYDROcodone-acetaminophen 5-325 MG tablet  Commonly known as:  NORCO/VICODIN  Take 1 tablet by mouth every 6 (six) hours as needed for moderate pain. Take one tablet twice daily for pain     hydroxychloroquine 200 MG tablet  Commonly known as:  PLAQUENIL  Take 200 mg by mouth daily.     lactose free nutrition Liqd  Take 237 mLs by mouth 2 (two) times daily between meals.     lansoprazole 30 MG capsule  Commonly known as:  PREVACID  TAKE ONE CAPSULE BY MOUTH EVERY DAY AT 12 NOON     lubiprostone 24 MCG capsule  Commonly known as:  AMITIZA  Take 1 capsule (24 mcg total) by mouth 2 (two) times daily with a meal.     LYRICA 75 MG capsule  Generic drug:  pregabalin  TAKE ONE CAPSULE BY MOUTH TWICE A DAY     memantine 28 MG Cp24 24 hr capsule  Commonly known as:  NAMENDA XR  Take one capsule by mouth once daily to preserve memory     metoprolol 50 MG tablet  Commonly known as:  LOPRESSOR  Take 1 tablet (50 mg total) by mouth 2 (two) times daily.     nystatin 100000 UNIT/ML  suspension  Commonly known as:  MYCOSTATIN  TAKE 1 TEASPOONFUL BY MOUTH 4 TIMES A DAY as needed for mouth pain     pilocarpine 5 MG tablet  Commonly known as:  SALAGEN  Take 5 mg by mouth 2 (two) times daily.     pravastatin 20 MG tablet  Commonly known as:  PRAVACHOL  Take 1 tablet (20 mg total) by mouth daily at 6 PM.     REFRESH TEARS 0.5 % Soln  Generic drug:  carboxymethylcellulose  Place 1 drop into both eyes 2 (two) times daily as needed (dry eyes).     sodium chloride 0.65 % Soln nasal spray  Commonly known as:  OCEAN  Place 1 spray into the nose as needed for congestion.     triamcinolone ointment 0.1 %  Commonly known as:  KENALOG  Apply 1 application topically 2 (two) times daily as needed (rash).       Allergies  Allergen Reactions  . Banana Nausea And Vomiting  . Codeine Nausea Only    unless given with Phenergan  . Klonopin [Clonazepam] Other (See Comments)    Causes hallucination   . Meperidine Hcl Nausea Only    unless given with Phenergan  . Norflex [Orphenadrine Citrate] Nausea Only    Unless given with Phenergan  . Oxycodone-Acetaminophen Nausea Only    unless given with phenergan  . Propoxyphene Hcl Nausea Only    unless given with phenergan  . Zoloft [Sertraline Hcl] Other (See Comments)    Caused lethargy  . Doxycycline Other (See Comments)    Unknown  . Naproxen Other (See Comments)  . Penicillins Other (See Comments)    Unknown  . Phenothiazines Other (See Comments)    Unknown  . Stelazine Other (See Comments)    Unknown  . Sulfamethoxazole-Trimethoprim Other (See Comments)    Unknown  . Tolectin [Tolmetin Sodium] Other (See Comments)    Unknown  . Tramadol Other (See Comments)    Unknown      The results of significant diagnostics from this hospitalization (including imaging, microbiology, ancillary and laboratory) are listed below for reference.    Significant Diagnostic Studies: Dg Chest 2 View  12/03/2014  CLINICAL DATA:   Fatigue and altered mental status.  Recent fall. EXAM: CHEST  2 VIEW COMPARISON:  05/06/2013. FINDINGS: The heart is mildly enlarged but stable. There is mild tortuosity and calcification of the thoracic aorta. The lungs demonstrate mild chronic bronchitic and emphysematous changes and left basilar scarring. No infiltrates or effusions. The bony thorax is intact. IMPRESSION: No acute cardiopulmonary findings.  Chronic lung changes Electronically Signed   By: Marijo Sanes M.D.   On: 12/03/2014 19:01   Ct Head Wo Contrast  12/16/2014  CLINICAL DATA:  Weakness EXAM: CT HEAD WITHOUT CONTRAST TECHNIQUE: Contiguous axial images were obtained from the base of the skull through the vertex without intravenous contrast. COMPARISON:  12/04/2014 FINDINGS: No skull fracture is noted. No intracranial hemorrhage, mass effect or midline shift. No acute cortical infarction. No mass lesion is noted on this unenhanced scan. Stable atrophy and chronic white matter disease. Stable old infarct in right parietal and right occipital lobe. IMPRESSION: No acute intracranial abnormality. Stable atrophy and chronic white matter disease. Stable old infarct in right parietal and right occipital lobe. Electronically Signed   By: Lahoma Crocker M.D.   On: 12/16/2014 13:06   Ct Head Wo Contrast  12/03/2014  CLINICAL DATA:  Recent fall, found lying on ground, initial encounter EXAM: CT HEAD WITHOUT CONTRAST CT CERVICAL SPINE WITHOUT CONTRAST TECHNIQUE: Multidetector CT imaging of the head and cervical spine was performed following the standard protocol without intravenous contrast. Multiplanar CT image reconstructions of the cervical spine were also generated. COMPARISON:  07/26/2014 FINDINGS: CT HEAD FINDINGS Bony calvarium is intact. Diffuse atrophic changes are seen. A chronic right parietal infarct is noted as well as scattered areas of chronic white matter ischemic change. There is also evidence of prior infarct in the right  subinsular  region. No acute hemorrhage, acute infarction or space-occupying mass lesion are not seen. CT CERVICAL SPINE FINDINGS Seven cervical segments are well visualized. Vertebral body height is well maintained. Mild osteophytic changes and facet hypertrophic changes are seen. No findings to suggest acute fracture or acute facet abnormality are noted. Stable left thyroid lesion which is been previously biopsied is noted. No other soft tissue abnormality is seen. The visualized lung apices are within normal limits. IMPRESSION: CT of the head: Chronic areas of previous ischemia.  No acute abnormality is noted. CT of cervical spine: Degenerative change without acute abnormality. Stable left thyroid lesion Electronically Signed   By: Inez Catalina M.D.   On: 12/03/2014 17:00   Ct Cervical Spine Wo Contrast  12/03/2014  CLINICAL DATA:  Recent fall, found lying on ground, initial encounter EXAM: CT HEAD WITHOUT CONTRAST CT CERVICAL SPINE WITHOUT CONTRAST TECHNIQUE: Multidetector CT imaging of the head and cervical spine was performed following the standard protocol without intravenous contrast. Multiplanar CT image reconstructions of the cervical spine were also generated. COMPARISON:  07/26/2014 FINDINGS: CT HEAD FINDINGS Bony calvarium is intact. Diffuse atrophic changes are seen. A chronic right parietal infarct is noted as well as scattered areas of chronic white matter ischemic change. There is also evidence of prior infarct in the right subinsular region. No acute hemorrhage, acute infarction or space-occupying mass lesion are not seen. CT CERVICAL SPINE FINDINGS Seven cervical segments are well visualized. Vertebral body height is well maintained. Mild osteophytic changes and facet hypertrophic changes are seen. No findings to suggest acute fracture or acute facet abnormality are noted. Stable left thyroid lesion which is been previously biopsied is noted. No other soft tissue abnormality is seen. The visualized lung  apices are within normal limits. IMPRESSION: CT of the head: Chronic areas of previous ischemia.  No acute abnormality is noted. CT of cervical spine: Degenerative change without acute abnormality. Stable left thyroid lesion Electronically Signed   By: Inez Catalina M.D.   On: 12/03/2014 17:00   Mr Brain Wo Contrast  12/04/2014  CLINICAL DATA:  Initial evaluation for acute encephalopathy. EXAM: MRI HEAD WITHOUT CONTRAST TECHNIQUE: Multiplanar, multiecho pulse sequences of the brain and surrounding structures were obtained without intravenous contrast. COMPARISON:  Prior CT from 12/03/2014. FINDINGS: Diffuse prominence of the CSF containing spaces is compatible with age-related cerebral atrophy. Mild T2/FLAIR hyperintensity within the periventricular white matter likely related to chronic small vessel ischemic disease. Focal encephalomalacia within the right frontal lobe and right parietal lobe present, consistent with remote infarcts. No abnormal foci of restricted diffusion to suggest acute intracranial infarct. Gray-white matter differentiation maintained. Normal intravascular flow voids are preserved. No acute or chronic intracranial hemorrhage. No mass lesion, midline shift, or mass effect. No hydrocephalus. No extra-axial fluid collection. Craniocervical junction within normal limits. Pituitary gland normal. No acute abnormality about the orbits. Sequela prior bilateral lens extraction noted. Mild mucosal thickening within the ethmoidal air cells. Right maxillary sinus is hypoplastic in appearance. No evidence for active sinus infection. No mastoid effusion. Inner ear structures normal. Bone marrow signal intensity within normal limits. No scalp soft tissue abnormality. IMPRESSION: 1. No acute intracranial infarct or other process identified. 2. Age-related cerebral atrophy with mild chronic small vessel ischemic disease and remote right frontal and parietal lobe infarcts. Electronically Signed   By:  Jeannine Boga M.D.   On: 12/04/2014 03:01   Ct Abdomen Pelvis W Contrast  12/07/2014  CLINICAL DATA:  Right lower quadrant abdominal pain,  lower abdominal swelling EXAM: CT ABDOMEN AND PELVIS WITH CONTRAST TECHNIQUE: Multidetector CT imaging of the abdomen and pelvis was performed using the standard protocol following bolus administration of intravenous contrast. CONTRAST:  173mL OMNIPAQUE IOHEXOL 300 MG/ML  SOLN COMPARISON:  07/20/2013 FINDINGS: Lower chest:  Lung bases are essentially clear. Hepatobiliary: Liver is within normal limits. No suspicious/enhancing hepatic lesions. Status post cholecystectomy. No intrahepatic ductal dilatation. Common duct measures 10 mm and smoothly tapers at the ampulla. Pancreas: Within normal limits. Spleen: Within normal limits. Adrenals/Urinary Tract: Adrenal glands are within normal limits. Multiple simple left renal cysts, measuring up to 10 mm in the left upper pole. 7 mm hyperdense lesion in the posterior interpolar left kidney with possible de-enhancement (74 HUs to 50 HUs), indeterminate. However, this is unchanged in size from 07/20/2013. Right kidney is within normal limits.  No hydronephrosis. Bladder is within normal limits. Stomach/Bowel: Stomach is notable for a tiny hiatal hernia. No evidence bowel obstruction. Prior appendectomy. Colonic diverticulosis, without evidence of diverticulitis. Vascular/Lymphatic: No evidence of abdominal aortic aneurysm. No suspicious abdominopelvic lymphadenopathy. Reproductive: Status post hysterectomy. Bilateral ovaries are within normal limits. Other: No abdominopelvic ascites. Small fat containing left inguinal hernia. Musculoskeletal: Degenerative changes of the visualized thoracolumbar spine. IMPRESSION: No evidence of bowel obstruction.  Prior appendectomy. Colonic diverticulosis, without evidence of diverticulitis. No CT findings to account for the patient's right lower quadrant abdominal pain. 7 mm hyperdense lesion  in the posterior interpolar left kidney with possible de-enhancement, indeterminate, but unchanged in size from 2015. If there is high clinical concern, consider follow-up CT abdomen with/without contrast in 1 year. Electronically Signed   By: Julian Hy M.D.   On: 12/07/2014 17:01   Dg Pelvis Portable  12/04/2014  CLINICAL DATA:  Fall. EXAM: PORTABLE PELVIS 1-2 VIEWS COMPARISON:  None. FINDINGS: No acute bony or joint abnormality identified. Degenerative changes both hips. No evidence of fracture or dislocation. IMPRESSION: No acute bony or joint abnormality.  Degenerative changes both hips. Electronically Signed   By: Marcello Moores  Register   On: 12/04/2014 08:09   Dg Chest Portable 1 View  12/16/2014  CLINICAL DATA:  Confusion and hypertension. EXAM: PORTABLE CHEST 1 VIEW COMPARISON:  December 03, 2014 FINDINGS: There is scarring in the left base region, stable. There is no edema or consolidation. Heart is borderline enlarged with pulmonary vascularity within normal limits. No adenopathy. There is degenerative change in the thoracic spine. IMPRESSION: Scarring left base. No edema or consolidation. No change in cardiac silhouette. Electronically Signed   By: Lowella Grip III M.D.   On: 12/16/2014 12:58   Dg Hand Complete Left  12/03/2014  CLINICAL DATA:  Left hand pain and swelling after fall onto an outstretched hand. Pain about the metacarpal phalangeal joints. EXAM: LEFT HAND - COMPLETE 3+ VIEW COMPARISON:  Radiographs 02/09/2008 FINDINGS: Patient unable to remove rings from the fourth digit. No acute fracture. Progressive osteoarthritis involving the thumb, index and middle finger metacarpal phalangeal joints with increased joint space narrowing. Additional scattered osteoarthritis throughout the left hand. No dislocation. There is mild dorsal soft tissue edema. No radiopaque foreign body. IMPRESSION: 1. No acute fracture or dislocation. 2. 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 Electronically Signed   By: Jeb Levering M.D.   On: 12/03/2014 19:03    Microbiology: No results found for this or any previous visit (from the past 240 hour(s)).   Labs: Basic Metabolic Panel:  Recent Labs Lab 12/16/14 1223 12/16/14 1224 12/17/14 0215  NA 139  140 140  K 4.1 4.0 3.9  CL 106 100* 103  CO2 27  --  27  GLUCOSE 114* 107* 89  BUN 15 18 12   CREATININE 0.71 0.70 0.70  CALCIUM 9.1  --  9.3   Liver Function Tests:  Recent Labs Lab 12/16/14 1223  AST 24  ALT 20  ALKPHOS 74  BILITOT 0.3  PROT 6.0*  ALBUMIN 3.3*   No results for input(s): LIPASE, AMYLASE in the last 168 hours. No results for input(s): AMMONIA in the last 168 hours. CBC:  Recent Labs Lab 12/16/14 1223 12/16/14 1224 12/17/14 0215  WBC 5.1  --  4.3  NEUTROABS 2.5  --   --   HGB 11.0* 11.9* 11.6*  HCT 33.6* 35.0* 35.5*  MCV 90.8  --  90.3  PLT 225  --  241   Cardiac Enzymes:  Recent Labs Lab 12/16/14 1710 12/16/14 2315  TROPONINI <0.03 <0.03   BNP: BNP (last 3 results) No results for input(s): BNP in the last 8760 hours.  ProBNP (last 3 results) No results for input(s): PROBNP in the last 8760 hours.  CBG:  Recent Labs Lab 12/16/14 1213  GLUCAP 110*       SignedDebbe Odea, MD [facility] 12/17/2014, 5:00 PM

## 2014-12-18 LAB — HEMOGLOBIN A1C
HEMOGLOBIN A1C: 6.7 % — AB (ref 4.8–5.6)
Mean Plasma Glucose: 146 mg/dL

## 2014-12-18 NOTE — Care Management Obs Status (Signed)
[place] NOTIFICATION   Patient Details  Name: Ariana [name] MRN: [id number] Date of Birth: [birth year]   Medicare Observation Status Notification Given:  Yes    Pollie Friar, RN 12/18/2014, 2:58 PM

## 2014-12-18 NOTE — Progress Notes (Signed)
Discharge orders received, Pt for discharge home today. IV d/c'd. D/c instructions and RX given with verbalized understanding. Family at bedside to assist patient with discharge. Staff bought pt downstairs via wheelchair. 12/18/14 1508

## 2014-12-18 NOTE — Care Management Note (Signed)
Case Management Note  Patient Details  Name: [name] MRN: [id number] Date of Birth: [birth year]  Subjective/Objective:                    Action/Plan: Patient being discharged home today with home health services. Patient receiving PT at home from [name] prior to admission. OT recommending home health OT. CM spoke with the patient and she would like to continue with [place]. [name] with [facility] notified of the patients discharge today and the addition of OT services. Will update bedside RN.   Expected Discharge Date:                  Expected Discharge Plan:  [place]  In-House Referral:     Discharge planning Services  CM Consult  Post Acute Care Choice:    Choice offered to:  Patient  DME Arranged:    DME Agency:     HH Arranged:  PT, OT HH Agency:  [facility]  Status of Service:  Completed, signed off  Medicare Important Message Given:    Date Medicare IM Given:    Medicare IM give by:    Date Additional Medicare IM Given:    Additional Medicare Important Message give by:     If discussed at [place] of Stay Meetings, dates discussed:    Additional Comments:  Pollie Friar, RN 12/18/2014, 10:03 AM

## 2014-12-18 NOTE — Progress Notes (Signed)
Ariana [name] was discharged yesterday by myself at 5 PM. However, nursing staff was not able to procure a ride home for her. She remains stable for discharge today. Discussed in detail with her RN today.   Debbe Odea, MD

## 2014-12-22 ENCOUNTER — Telehealth: Payer: Self-pay

## 2014-12-22 ENCOUNTER — Ambulatory Visit: Payer: Self-pay | Admitting: Nurse Practitioner

## 2014-12-22 ENCOUNTER — Other Ambulatory Visit: Payer: Medicare Other

## 2014-12-22 NOTE — Telephone Encounter (Signed)
Patient  came to the office today with complaints of constipation and a possible UTI says her stool has been hard and coming out in little balls for the last 6 weeks and that she has tried Miralax, dulcolax, and fiber every morning  and nothing helps. I offered her an appointment today at 4:15 with Ariana [name] she said she could not make it as her help goes home at 5:00 and her daughter makes all her appointments. As far as her UTI she says that her urine is dark and burns when she goes and that it comes out in dribbles at times and she is going late night and wetting herself trying to get to bathroom.told her she would need to be seen, Please advise

## 2014-12-23 NOTE — Telephone Encounter (Signed)
Who brought the patient?   These are all of her chronic conditions.  She needs to drink more fluids--dark pee means concentrated pee.  She does not follow orders for her bowel regimen.  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  Caregivers need to actually watch her bms and document them and keep track of what she actually takes for the problem.  Pt is not able to do this herself due to her dementia.  Her daughter knows this and we had tried to get a routine going, but she was hospitalized again.  Please call her daughter and discuss with her

## 2014-12-23 NOTE — Telephone Encounter (Signed)
Called patients daughter made appointment for Monday 12/28/2014

## 2014-12-23 NOTE — Telephone Encounter (Signed)
Pt was to come yesterday for f/u bmp per chart after stopping her hctz medication. When she left the hospital, she was to have a hospital follow up.  That may have also been scheduled--please check.  If it wasn't, she should be seen in the next week.

## 2014-12-23 NOTE — Telephone Encounter (Signed)
Her aid brought her in because they thought she was to come in for labs.  Called spoke with the patients daughter she said she would try to get the aid to keep a record again. She was concerned that the appointment her mother was given was for labs and not hospital follow-up as she was told by hospital nurse.  Because she just got out of the hospital on Friday she said she was told this  was the type of appointment her mother was coming for not lab.

## 2014-12-28 ENCOUNTER — Ambulatory Visit: Payer: Self-pay | Admitting: Internal Medicine

## 2015-01-01 ENCOUNTER — Telehealth: Payer: Self-pay

## 2015-01-01 ENCOUNTER — Other Ambulatory Visit: Payer: Self-pay

## 2015-01-01 MED ORDER — PRAVASTATIN SODIUM 20 MG PO TABS: 20.0000 mg | ORAL_TABLET | Freq: Every day | ORAL | Status: DC

## 2015-01-01 NOTE — Telephone Encounter (Signed)
Patient's daughter is calling to get recommendations- patient is going to the bathroom every hour since last weekend. Patient missed appointment Monday because her new caregiver was late.   Called patient's daughter, patient urinating every hour. No pain or discomfort. Patient was in the hospital recently and urine sample was clear then. Please advise

## 2015-01-01 NOTE — Telephone Encounter (Signed)
This is probably related to worsening of her dementia--her brain and nerves are not sending proper signals to her bladder at the proper times.  She needs to come in for an appt next week to follow up from her last hospitalization.  If she develops a fever, her daughter should contact on call this weekend.

## 2015-01-01 NOTE — Telephone Encounter (Signed)
I called patient's daughter with recommendations. Patient needs an afternoon appointment. Morning appointments are not possible. Please advise if patient can be added on in the afternoon on Monday. If yes please send to covering triage assistant for Monday- Avel Sensor and to Cordella Register (authroized to double book and change schedule template)

## 2015-01-04 ENCOUNTER — Ambulatory Visit (INDEPENDENT_AMBULATORY_CARE_PROVIDER_SITE_OTHER): Payer: Medicare Other | Admitting: Internal Medicine

## 2015-01-04 ENCOUNTER — Other Ambulatory Visit: Payer: Self-pay

## 2015-01-04 ENCOUNTER — Encounter: Payer: Self-pay | Admitting: Internal Medicine

## 2015-01-04 VITALS — BP 130/70 | HR 62 | Temp 97.4°F | Resp 20 | Ht 63.0 in | Wt 154.2 lb

## 2015-01-04 DIAGNOSIS — N3946 Mixed incontinence: Secondary | ICD-10-CM | POA: Diagnosis not present

## 2015-01-04 DIAGNOSIS — N3001 Acute cystitis with hematuria: Secondary | ICD-10-CM | POA: Diagnosis not present

## 2015-01-04 DIAGNOSIS — F03918 Unspecified dementia, unspecified severity, with other behavioral disturbance: Secondary | ICD-10-CM

## 2015-01-04 DIAGNOSIS — F0391 Unspecified dementia with behavioral disturbance: Secondary | ICD-10-CM | POA: Diagnosis not present

## 2015-01-04 DIAGNOSIS — R55 Syncope and collapse: Secondary | ICD-10-CM

## 2015-01-04 MED ORDER — MIRABEGRON ER 25 MG PO TB24: 25.0000 mg | ORAL_TABLET | Freq: Every day | ORAL | Status: DC

## 2015-01-04 MED ORDER — METOPROLOL TARTRATE 50 MG PO TABS: 50.0000 mg | ORAL_TABLET | Freq: Two times a day (BID) | ORAL | Status: DC

## 2015-01-04 MED ORDER — DILTIAZEM HCL ER COATED BEADS 180 MG PO CP24: 180.0000 mg | ORAL_CAPSULE | Freq: Every day | ORAL | Status: DC

## 2015-01-04 NOTE — Progress Notes (Signed)
Patient ID: Ariana [name], female   DOB: 05/03/1929, 81 y.o.   MRN: [id number]   Location: [facility] Provider: Rexene Edison. Mariea Clonts, D.O., C.M.D.  Code Status:  Goals of Care: Advanced Directive information    Chief Complaint  Patient presents with  . Acute Visit    passed out yesterday at home,been feeling bad for years,constipation for weeks, daughter(Rhondalyn)      HPI: Patient is a 81 y.o. female seen in the office today for an acute visit after passing out yesterday at home.    Her daughter called her at 9:40am, but she didn't come to the phone.  Pt sometimes uses restroom around that time.  Sometimes hears a recording on the phone.  She called again at 12:30 and pt said she didn't know where she was earlier.  Said she was passed out between the couch.  She didn't remember what happened and woke up 10 mins after her daughter had called.  She didn't remember what happened and didn't know if she'd taken her pills.  Her son in law left and went over.  She c/o back pain.  She had no headache or other new pain.  Her son in law made her lunch and gave her her morning pills.  Asked visiting angels to come for the rest of the day and night.  Pt also had eye of stove left on.  No one had been there with her and pt had fixed her own tea at some point.  None of the staff ever came apparently.  Caregiver came in the morning as is the routine until 5pm.  They've been coming and going at different times b/c her regular caregiver quit.     Notes some shortness of breath and able to feel her irregular heart beat.    Continues with urinary frequency and now notes stress incontinence also--urinates as she has to push down with her arms to get up out of the chair.  Urine was dark in color yesterday.  Drinks hot and cold tea, says drinks water and cranberry juice.  Had been on myrbetriq, but pt said not helping so her daughter did not refill.  Discussed that it may have been helping.      Son in law has  to come in and out of the home and is going to lose his job b/c of this.  Pt with paranoid psychosis and is not a reliable historian.  Discussed with her daughter need for placement.  Concern is that her daughter does not have guardianship.  Pt is now doing dangerous things like leaving the stove on.   Pt does not press button when falls and now won't wear it.  Caregivers are helping with bathing, dressing, grooming and does transfer self, but falls frequently.  Has had memory loss since 2008.  If takes bowel regimen consistently, she gets diarrhea.  Caregivers don't document appropriately to provide valuable.     Her daughter completed her course of abx after she returned home from her last hospital stay b/c it was stopped midway through after a clean UA at hospital admission.  Review of Systems:  Review of Systems  Constitutional: Positive for malaise/fatigue.  HENT: Positive for hearing loss. Negative for congestion.   Eyes: Positive for blurred vision.  Respiratory: Positive for shortness of breath. Negative for cough and wheezing.   Cardiovascular: Positive for palpitations. Negative for chest pain.       Says she can feel her afib  Gastrointestinal: Positive for constipation. Negative for abdominal pain, diarrhea, blood in stool and melena.  Genitourinary: Positive for urgency and frequency. Negative for dysuria and hematuria.       Leakage when she stands to walk to restroom  Musculoskeletal: Positive for back pain and falls.  Skin: Negative for rash.  Neurological: Positive for loss of consciousness and weakness. Negative for dizziness and headaches.       Pt says she passed out yesterday--no one was there to say one way or the other and she didn't answer her phone  Psychiatric/Behavioral: Positive for depression and memory loss. Negative for hallucinations. The patient is nervous/anxious. The patient does not have insomnia.     Past Medical History  Diagnosis Date  . Sjogren's  syndrome ([place])   . Dry eye syndrome   . Hypertension, benign   . Mitral valve prolapse   . GERD (gastroesophageal reflux disease)   . Diverticulosis of colon   . Irritable bowel syndrome   . Urinary incontinence   . Low back pain syndrome   . Fibromyalgia   . Memory loss   . Anxiety and depression   . History of adverse drug reaction   . Peripheral neuropathy ([facility])     "both feet and legs"  . Shortness of breath 07/18/11    "alot lately"  . Anginal pain ([place])   . History of recurrent UTIs   . H/O hiatal hernia   . Anxiety   . Dementia   . Depression   . Abnormality of gait   . Thyroid nodule   . Headache(784.0) 09/05/2012  . Adenomatous polyp of colon 2002    55mm  . History of cerebrovascular disease 09/24/2014    Past Surgical History  Procedure Laterality Date  . Vesicovaginal fistula closure w/ tah    . Appendectomy    . Cholecystectomy  2000  . Mandible surgery    . Temporomandibular joint surgery  1986    Dr. Terence Lux  . Cataract extraction, bilateral    . Abdominal hysterectomy  1967  . Dental surgery      multiple tooth extractions  . Esophagogastroduodenoscopy (egd) with esophageal dilation N/A 08/23/2012    Procedure: ESOPHAGOGASTRODUODENOSCOPY (EGD) WITH ESOPHAGEAL DILATION;  Surgeon: Milus Banister, MD;  Location: [facility];  Service: Endoscopy;  Laterality: N/A;  . Colonoscopy w/ biopsies      multiple  . Nasal septum surgery  1980  . Transthoracic echocardiogram  2001    mild LVH, normal LV  . Nm myocar perf wall motion  2003    persantine - normal static and dynamic study w/apical thinning and presvered LV function, no ischemia  . Cardiac catheterization  02/17/2003    normal L main, LAD free of disease, Cfx free of disease, RCA free of disease (Dr. Loni Muse. [facility])    Allergies  Allergen Reactions  . Banana Nausea And Vomiting  . Codeine Nausea Only    unless given with Phenergan  . Klonopin [Clonazepam] Other (See Comments)    Causes  hallucination   . Meperidine Hcl Nausea Only    unless given with Phenergan  . Norflex [Orphenadrine Citrate] Nausea Only    Unless given with Phenergan  . Oxycodone-Acetaminophen Nausea Only    unless given with phenergan  . Propoxyphene Hcl Nausea Only    unless given with phenergan  . Zoloft [Sertraline Hcl] Other (See Comments)    Caused lethargy  . Doxycycline Other (See Comments)    Unknown  . Naproxen  Other (See Comments)  . Penicillins Other (See Comments)    Unknown  . Phenothiazines Other (See Comments)    Unknown  . Stelazine Other (See Comments)    Unknown  . Sulfamethoxazole-Trimethoprim Other (See Comments)    Unknown  . Tolectin [Tolmetin Sodium] Other (See Comments)    Unknown  . Tramadol Other (See Comments)    Unknown      Medication List       This list is accurate as of: 01/04/15 12:11 PM.  Always use your most recent med list.               ALPRAZolam 0.5 MG tablet  Commonly known as:  XANAX  Take 1 tablet (0.5 mg total) by mouth 2 (two) times daily as needed for anxiety.     aspirin 325 MG tablet  Take 1 tablet (325 mg total) by mouth daily.     bisacodyl 5 MG EC tablet  Commonly known as:  bisacodyl  Take 2 tablets (10 mg total) by mouth daily as needed for moderate constipation. If no effective bowel movement every 2- 3 days     CALCIUM CARBONATE PO  Take 1 tablet by mouth daily.     cephALEXin 500 MG capsule  Commonly known as:  KEFLEX  Take 500 mg by mouth 3 (three) times daily.     cetirizine 10 MG tablet  Commonly known as:  ZYRTEC  Take 1 tablet (10 mg total) by mouth daily.     CRANBERRY SOFT PO  Take 1 capsule by mouth daily.     diltiazem 180 MG 24 hr capsule  Commonly known as:  CARDIZEM CD  Take 180 mg by mouth daily.     DULoxetine 30 MG capsule  Commonly known as:  CYMBALTA  Take one tablet  once daily in the morning for depression     DULoxetine 60 MG capsule  Commonly known as:  CYMBALTA  Take one tablet  by mouth in the evening once daily for depression     HYDROcodone-acetaminophen 5-325 MG tablet  Commonly known as:  NORCO/VICODIN  Take 1 tablet by mouth every 6 (six) hours as needed for moderate pain. Take one tablet twice daily for pain     hydroxychloroquine 200 MG tablet  Commonly known as:  PLAQUENIL  Take 200 mg by mouth daily.     lactose free nutrition Liqd  Take 237 mLs by mouth 2 (two) times daily between meals.     lansoprazole 30 MG capsule  Commonly known as:  PREVACID  TAKE ONE CAPSULE BY MOUTH EVERY DAY AT 12 NOON     lubiprostone 24 MCG capsule  Commonly known as:  AMITIZA  Take 1 capsule (24 mcg total) by mouth 2 (two) times daily with a meal.     LYRICA 75 MG capsule  Generic drug:  pregabalin  TAKE ONE CAPSULE BY MOUTH TWICE A DAY     memantine 28 MG Cp24 24 hr capsule  Commonly known as:  NAMENDA XR  Take one capsule by mouth once daily to preserve memory     metoprolol 50 MG tablet  Commonly known as:  LOPRESSOR  Take 1 tablet (50 mg total) by mouth 2 (two) times daily.     nystatin 100000 UNIT/ML suspension  Commonly known as:  MYCOSTATIN  TAKE 1 TEASPOONFUL BY MOUTH 4 TIMES A DAY as needed for mouth pain     pilocarpine 5 MG tablet  Commonly known as:  SALAGEN  Take 5 mg by mouth 2 (two) times daily.     pravastatin 20 MG tablet  Commonly known as:  PRAVACHOL  Take 1 tablet (20 mg total) by mouth daily at 6 PM.     REFRESH TEARS 0.5 % Soln  Generic drug:  carboxymethylcellulose  Place 1 drop into both eyes 2 (two) times daily as needed (dry eyes).     sodium chloride 0.65 % Soln nasal spray  Commonly known as:  OCEAN  Place 1 spray into the nose as needed for congestion.     triamcinolone ointment 0.1 %  Commonly known as:  KENALOG  Apply 1 application topically 2 (two) times daily as needed (rash).        Health Maintenance  Topic Date Due  . DEXA SCAN  07/04/1996  . INFLUENZA VACCINE  09/14/2015  . TETANUS/TDAP  12/09/2023  .  ZOSTAVAX  Completed  . PNA vac Low Risk Adult  Completed    Physical Exam: Filed Vitals:   01/04/15 1159  BP: 130/70  Pulse: 62  Temp: 97.4 F (36.3 C)  TempSrc: Oral  Resp: 20  Height: 5\' 3"  (1.6 m)  Weight: 154 lb 3.2 oz (69.945 kg)  SpO2: 94%   Body mass index is 27.32 kg/(m^2). Physical Exam  Constitutional: She appears well-developed. No distress.  HENT:  Head: Normocephalic and atraumatic.  Cardiovascular: Regular rhythm.   irreg irreg, no m/g/r; chronic 1+ edema  Pulmonary/Chest: Effort normal and breath sounds normal. No respiratory distress.  Abdominal: Soft. Bowel sounds are normal. She exhibits no distension and no mass. There is no tenderness.  Neurological: She is alert. No cranial nerve deficit.  Oriented to person and place, not time, repeats similar questions to last visit and takes prolonged time to tell stories which are inaccurate, some aphasia  Skin: Skin is warm and dry.  Psychiatric:  Very defensive when asked questions, argumentative; negative to her daughter claiming she hasn't been over to see her in a month (pt and daughter have been here within the month)    Labs reviewed: Basic Metabolic Panel:  Recent Labs  07/08/14 1513  09/04/14 1226  12/04/14 0136  12/10/14 1613 12/16/14 1223 12/16/14 1224 12/17/14 0215  NA  --   < > 136  < > 140  < > 128* 139 140 140  K  --   < > 4.3  < > 3.5  < > 4.1 4.1 4.0 3.9  CL  --   < > 94*  < > 101  < > 87* 106 100* 103  CO2  --   --  27  < > 29  < > 26 27  --  27  GLUCOSE  --   < > 148*  < > 124*  < > 83 114* 107* 89  BUN  --   < > 24  < > 19  < > 23 15 18 12   CREATININE  --   < > 0.77  < > 0.75  < > 0.82 0.71 0.70 0.70  CALCIUM  --   --  10.0  < > 9.5  < > 9.7 9.1  --  9.3  TSH 0.82  --  1.230  --  1.132  --   --   --   --   --   < > = values in this interval not displayed. Liver Function Tests:  Recent Labs  12/07/14 1409 12/08/14 0630 12/16/14 1223  AST 39 32 24  ALT  30 29 20   ALKPHOS 88 86  74  BILITOT 0.7 0.6 0.3  PROT 7.0 6.4* 6.0*  ALBUMIN 3.7 3.5 3.3*    Recent Labs  12/07/14 1409  LIPASE 36    Recent Labs  12/04/14 0815  AMMONIA 28   CBC:  Recent Labs  09/04/14 1226  12/08/14 0630 12/10/14 1613 12/16/14 1223 12/16/14 1224 12/17/14 0215  WBC 6.2  < > 4.3 5.1 5.1  --  4.3  NEUTROABS 4.2  --   --  2.7 2.5  --   --   HGB  --   < > 11.9*  --  11.0* 11.9* 11.6*  HCT 36.2  < > 35.4* 35.2 33.6* 35.0* 35.5*  MCV  --   < > 89.4  --  90.8  --  90.3  PLT  --   < > 206  --  225  --  241  < > = values in this interval not displayed. Lipid Panel:  Recent Labs  07/27/14 0332 12/17/14 0215  CHOL 157 121  HDL 48 45  LDLCALC 97 60  TRIG 58 78  CHOLHDL 3.3 2.7   Lab Results  Component Value Date   HGBA1C 6.7* 12/17/2014    Procedures since last visit: Records from last hospitalization were reviewed  Assessment/Plan 1. Mixed incontinence urge and stress -discussed that this is not consistent with UTI but failure of muscles supporting her bladder and the sphincters -also noted that she had stopped myrbetriq and now it seems it had been helping so script sent back in (cost has also been an issue)  2. Syncope and collapse -unclear if this truly occurred b/c no one was there with pt to observe one way or the other -no acute findings on exam today  3. Senile dementia, with behavioral disturbance -she requires 24 hr care -current organization has not provided that even when asked -pt not safe at home with leaving stove on, unable to use her life alert button properly due to complete lack of insight and judgment from her dementia and bipolar disorder -she needs SNF placement at this point as she requires adl help with all except feeding at this point due to incontinence -she is not able to make decisions for herself between her dementia and psychiatric disease  4. Acute cystitis with hematuria -completed abx course and still incontinent--has been  incontinent for the whole time she's been coming here but she doesn't remember that  FL-2 completed today for placement--family requests [facility]  Labs/tests ordered:  No new Next appt:  Keep appt in 6 wks   Kennedy Brines L. Jamielyn Petrucci, D.O. [facility] [address], [place] [address] Cell Phone (Mon-Fri 8am-5pm):  [phone] On Call:  [phone] & follow prompts after 5pm & weekends Office Phone:  [phone] Office Fax:  [phone]

## 2015-01-04 NOTE — Telephone Encounter (Signed)
Schedule for next week in an open slot then. Do not double book today.

## 2015-01-06 ENCOUNTER — Encounter: Payer: Self-pay | Admitting: Internal Medicine

## 2015-01-11 ENCOUNTER — Other Ambulatory Visit: Payer: Self-pay | Admitting: Internal Medicine

## 2015-01-16 ENCOUNTER — Other Ambulatory Visit: Payer: Self-pay | Admitting: Internal Medicine

## 2015-01-18 ENCOUNTER — Telehealth: Payer: Self-pay | Admitting: *Deleted

## 2015-01-18 NOTE — Telephone Encounter (Signed)
Ariana [name] with [facility] called and wanted verbal for extension of Home Health 2 times a week for 1 week for PT and 2 times a week for 1 week Home Health Aid.  Verbal order given.

## 2015-01-21 ENCOUNTER — Other Ambulatory Visit: Payer: Self-pay | Admitting: *Deleted

## 2015-01-21 MED ORDER — ALPRAZOLAM 0.5 MG PO TABS
0.5000 mg | ORAL_TABLET | Freq: Two times a day (BID) | ORAL | Status: DC | PRN
Start: 2015-01-21 — End: 2015-04-20

## 2015-01-21 NOTE — Telephone Encounter (Signed)
Patient daughter requested to be faxed to [facility]

## 2015-01-29 ENCOUNTER — Other Ambulatory Visit: Payer: Self-pay | Admitting: *Deleted

## 2015-01-29 MED ORDER — HYDROCODONE-ACETAMINOPHEN 5-325 MG PO TABS
1.0000 | ORAL_TABLET | Freq: Four times a day (QID) | ORAL | Status: DC | PRN
Start: 2015-01-29 — End: 2015-04-20

## 2015-01-29 NOTE — Telephone Encounter (Signed)
Patient daughter, Lanyah requested and will pick up 

## 2015-02-05 ENCOUNTER — Other Ambulatory Visit: Payer: Self-pay | Admitting: Internal Medicine

## 2015-02-11 ENCOUNTER — Ambulatory Visit: Payer: Medicare Other | Admitting: Internal Medicine

## 2015-02-18 ENCOUNTER — Other Ambulatory Visit: Payer: Self-pay | Admitting: Internal Medicine

## 2015-02-25 ENCOUNTER — Encounter: Payer: Self-pay | Admitting: Internal Medicine

## 2015-02-25 ENCOUNTER — Ambulatory Visit (INDEPENDENT_AMBULATORY_CARE_PROVIDER_SITE_OTHER): Payer: Medicare Other | Admitting: Internal Medicine

## 2015-02-25 VITALS — BP 120/62 | HR 66 | Temp 98.0°F | Ht 63.0 in | Wt 150.0 lb

## 2015-02-25 DIAGNOSIS — E042 Nontoxic multinodular goiter: Secondary | ICD-10-CM | POA: Diagnosis not present

## 2015-02-25 DIAGNOSIS — T402X5A Adverse effect of other opioids, initial encounter: Secondary | ICD-10-CM

## 2015-02-25 DIAGNOSIS — F03918 Unspecified dementia, unspecified severity, with other behavioral disturbance: Secondary | ICD-10-CM

## 2015-02-25 DIAGNOSIS — R296 Repeated falls: Secondary | ICD-10-CM | POA: Diagnosis not present

## 2015-02-25 DIAGNOSIS — F0391 Unspecified dementia with behavioral disturbance: Secondary | ICD-10-CM

## 2015-02-25 DIAGNOSIS — K5903 Drug induced constipation: Secondary | ICD-10-CM | POA: Diagnosis not present

## 2015-02-25 DIAGNOSIS — N3946 Mixed incontinence: Secondary | ICD-10-CM

## 2015-02-25 NOTE — Progress Notes (Signed)
Patient ID: Ariana [name], female   DOB: [birth year], 82 y.o.   MRN: [id number]   Location: [facility] Provider: Rexene Edison. Mariea Clonts, D.O., C.M.D.  Code Status: full code Goals of Care: Advanced Directive information Does patient have an advance directive?: Yes, Type of Advance Directive: Healthcare Power of Attorney  Chief Complaint  Patient presents with  . Medical Management of Chronic Issues    dementia, blood pressure, thyroid. Here with daughter Ariana [name]    HPI: Patient is a 82 y.o. female seen in the office today for med mgt of chronic diseases including her dementia, bipolar, hypertension, hypothyroidism, chronic pain, IBS with constipation, hyperlipidemia, urinary incontinence, peripheral neuropathy, afib and more.  Last time, we discussed that she is not safe at home and needs snf placement due to dementia and inability to perform self-care.  She was still having time at home alone and being found on the floor multiple times.  FL-2 form was completed, but she's back in f/u today, still at home.  Blood pressure excellent today.    No falls since last appt in November.  Using rollator walker.  People with her 8 hrs per day.    Pain in backs of her legs and her lower back lately.  BMs come and go.  She has been out of her amitiza for a couple of days.    Review of Systems:  Review of Systems  Constitutional: Positive for malaise/fatigue. Negative for fever and chills.  HENT: Negative for congestion.   Eyes: Positive for blurred vision.  Respiratory: Negative for shortness of breath.   Cardiovascular: Negative for chest pain and leg swelling.  Gastrointestinal: Positive for abdominal pain and constipation. Negative for blood in stool and melena.       Right lower side  Genitourinary: Positive for urgency and frequency. Negative for dysuria.  Musculoskeletal: Negative for falls.       Since last appt  Neurological: Positive for weakness. Negative for dizziness.    Endo/Heme/Allergies: Bruises/bleeds easily.  Psychiatric/Behavioral: Positive for depression and memory loss.    Past Medical History  Diagnosis Date  . Sjogren's syndrome ([place])   . Dry eye syndrome   . Hypertension, benign   . Mitral valve prolapse   . GERD (gastroesophageal reflux disease)   . Diverticulosis of colon   . Irritable bowel syndrome   . Urinary incontinence   . Low back pain syndrome   . Fibromyalgia   . Memory loss   . Anxiety and depression   . History of adverse drug reaction   . Peripheral neuropathy ([facility])     "both feet and legs"  . Shortness of breath 07/18/11    "alot lately"  . Anginal pain ([place])   . History of recurrent UTIs   . H/O hiatal hernia   . Anxiety   . Dementia   . Depression   . Abnormality of gait   . Thyroid nodule   . Headache(784.0) 09/05/2012  . Adenomatous polyp of colon 2002    65mm  . History of cerebrovascular disease 09/24/2014    Past Surgical History  Procedure Laterality Date  . Vesicovaginal fistula closure w/ tah    . Appendectomy    . Cholecystectomy  2000  . Mandible surgery    . Temporomandibular joint surgery  1986    Dr. Terence Lux  . Cataract extraction, bilateral    . Abdominal hysterectomy  1967  . Dental surgery      multiple tooth extractions  .  Esophagogastroduodenoscopy (egd) with esophageal dilation N/A 08/23/2012    Procedure: ESOPHAGOGASTRODUODENOSCOPY (EGD) WITH ESOPHAGEAL DILATION;  Surgeon: Milus Banister, MD;  Location: [facility];  Service: Endoscopy;  Laterality: N/A;  . Colonoscopy w/ biopsies      multiple  . Nasal septum surgery  1980  . Transthoracic echocardiogram  2001    mild LVH, normal LV  . Nm myocar perf wall motion  2003    persantine - normal static and dynamic study w/apical thinning and presvered LV function, no ischemia  . Cardiac catheterization  02/17/2003    normal L main, LAD free of disease, Cfx free of disease, RCA free of disease (Dr. Loni Muse. Little)    Allergies   Allergen Reactions  . Banana Nausea And Vomiting  . Codeine Nausea Only    unless given with Phenergan  . Klonopin [Clonazepam] Other (See Comments)    Causes hallucination   . Meperidine Hcl Nausea Only    unless given with Phenergan  . Norflex [Orphenadrine Citrate] Nausea Only    Unless given with Phenergan  . Oxycodone-Acetaminophen Nausea Only    unless given with phenergan  . Propoxyphene Hcl Nausea Only    unless given with phenergan  . Zoloft [Sertraline Hcl] Other (See Comments)    Caused lethargy  . Doxycycline Other (See Comments)    Unknown  . Naproxen Other (See Comments)  . Penicillins Other (See Comments)    Unknown  . Phenothiazines Other (See Comments)    Unknown  . Stelazine Other (See Comments)    Unknown  . Sulfamethoxazole-Trimethoprim Other (See Comments)    Unknown  . Tolectin [Tolmetin Sodium] Other (See Comments)    Unknown  . Tramadol Other (See Comments)    Unknown      Medication List       This list is accurate as of: 02/25/15  1:47 PM.  Always use your most recent med list.               ALPRAZolam 0.5 MG tablet  Commonly known as:  XANAX  Take 1 tablet (0.5 mg total) by mouth 2 (two) times daily as needed for anxiety.     aspirin 325 MG tablet  Take 1 tablet (325 mg total) by mouth daily.     bisacodyl 5 MG EC tablet  Commonly known as:  bisacodyl  Take 2 tablets (10 mg total) by mouth daily as needed for moderate constipation. If no effective bowel movement every 2- 3 days     CALCIUM CARBONATE PO  Take 1 tablet by mouth daily.     cetirizine 10 MG tablet  Commonly known as:  ZYRTEC  Take 1 tablet (10 mg total) by mouth daily.     CRANBERRY SOFT PO  Take 1 capsule by mouth daily.     diltiazem 180 MG 24 hr capsule  Commonly known as:  CARDIZEM CD  Take 1 capsule (180 mg total) by mouth daily.     DULoxetine 30 MG capsule  Commonly known as:  CYMBALTA  Take one tablet  once daily in the morning for depression      DULoxetine 60 MG capsule  Commonly known as:  CYMBALTA  Take one tablet by mouth in the evening once daily for depression     HYDROcodone-acetaminophen 5-325 MG tablet  Commonly known as:  NORCO/VICODIN  Take 1 tablet by mouth every 6 (six) hours as needed for moderate pain. Take one tablet twice daily for pain  hydroxychloroquine 200 MG tablet  Commonly known as:  PLAQUENIL  Take 200 mg by mouth. Take one tablet twice daily Monday thru Friday, only     lactose free nutrition Liqd  Take 237 mLs by mouth 2 (two) times daily between meals.     lansoprazole 30 MG capsule  Commonly known as:  PREVACID  TAKE ONE CAPSULE BY MOUTH EVERY DAY AT 12 NOON     lubiprostone 24 MCG capsule  Commonly known as:  AMITIZA  Take 1 capsule (24 mcg total) by mouth 2 (two) times daily with a meal.     LYRICA 75 MG capsule  Generic drug:  pregabalin  TAKE ONE CAPSULE BY MOUTH TWICE A DAY     memantine 28 MG Cp24 24 hr capsule  Commonly known as:  NAMENDA XR  Take one capsule by mouth once daily to preserve memory     metoprolol 50 MG tablet  Commonly known as:  LOPRESSOR  Take 1 tablet (50 mg total) by mouth 2 (two) times daily.     mirabegron ER 25 MG Tb24 tablet  Commonly known as:  MYRBETRIQ  Take 1 tablet (25 mg total) by mouth daily.     nystatin 100000 UNIT/ML suspension  Commonly known as:  MYCOSTATIN  TAKE 1 TEASPOONFUL BY MOUTH 4 TIMES A DAY as needed for mouth pain     pilocarpine 5 MG tablet  Commonly known as:  SALAGEN  Take 5 mg by mouth 2 (two) times daily.     pravastatin 20 MG tablet  Commonly known as:  PRAVACHOL  Take 1 tablet (20 mg total) by mouth daily at 6 PM.     REFRESH TEARS 0.5 % Soln  Generic drug:  carboxymethylcellulose  Place 1 drop into both eyes 2 (two) times daily as needed (dry eyes).     sodium chloride 0.65 % Soln nasal spray  Commonly known as:  OCEAN  Place 1 spray into the nose as needed for congestion.     triamcinolone ointment 0.1 %    Commonly known as:  KENALOG  Apply 1 application topically 2 (two) times daily as needed (rash).        Health Maintenance  Topic Date Due  . DEXA SCAN  07/04/1996  . INFLUENZA VACCINE  09/14/2015  . TETANUS/TDAP  12/09/2023  . ZOSTAVAX  Completed  . PNA vac Low Risk Adult  Completed    Physical Exam: Filed Vitals:   02/25/15 1341  BP: 120/62  Pulse: 66  Temp: 98 F (36.7 C)  TempSrc: Oral  Height: 5\' 3"  (1.6 m)  Weight: 150 lb (68.04 kg)  SpO2: 94%   Body mass index is 26.58 kg/(m^2). Physical Exam  Constitutional: No distress.  Cardiovascular: Intact distal pulses.   irreg irreg  Pulmonary/Chest: Effort normal and breath sounds normal.  Abdominal: Soft. Bowel sounds are normal. She exhibits no distension. There is tenderness.  Right lower quadrant  Musculoskeletal: She exhibits no edema.  Stooped posture, walking with some shuffling, tender in her lower back   Neurological: She is alert.  Skin: Skin is warm and dry.  Dry scaly skin at hairline    Labs reviewed: Basic Metabolic Panel:  Recent Labs  07/08/14 1513  09/04/14 1226  12/04/14 0136  12/10/14 1613 12/16/14 1223 12/16/14 1224 12/17/14 0215  NA  --   < > 136  < > 140  < > 128* 139 140 140  K  --   < > 4.3  < >  3.5  < > 4.1 4.1 4.0 3.9  CL  --   < > 94*  < > 101  < > 87* 106 100* 103  CO2  --   --  27  < > 29  < > 26 27  --  27  GLUCOSE  --   < > 148*  < > 124*  < > 83 114* 107* 89  BUN  --   < > 24  < > 19  < > 23 15 18 12   CREATININE  --   < > 0.77  < > 0.75  < > 0.82 0.71 0.70 0.70  CALCIUM  --   --  10.0  < > 9.5  < > 9.7 9.1  --  9.3  TSH 0.82  --  1.230  --  1.132  --   --   --   --   --   < > = values in this interval not displayed. Liver Function Tests:  Recent Labs  12/07/14 1409 12/08/14 0630 12/16/14 1223  AST 39 32 24  ALT 30 29 20   ALKPHOS 88 86 74  BILITOT 0.7 0.6 0.3  PROT 7.0 6.4* 6.0*  ALBUMIN 3.7 3.5 3.3*    Recent Labs  12/07/14 1409  LIPASE 36    Recent  Labs  12/04/14 0815  AMMONIA 28   CBC:  Recent Labs  09/04/14 1226  12/10/14 1613 12/16/14 1223 12/16/14 1224 12/17/14 0215  WBC 6.2  < > 5.1 5.1  --  4.3  NEUTROABS 4.2  --  2.7 2.5  --   --   HGB  --   < >  --  11.0* 11.9* 11.6*  HCT 36.2  < > 35.2 33.6* 35.0* 35.5*  MCV 91  < > 88 90.8  --  90.3  PLT 212  < > 241 225  --  241  < > = values in this interval not displayed. Lipid Panel:  Recent Labs  07/27/14 0332 12/17/14 0215  CHOL 157 121  HDL 48 45  LDLCALC 97 60  TRIG 58 78  CHOLHDL 3.3 2.7   Lab Results  Component Value Date   HGBA1C 6.7* 12/17/2014    Assessment/Plan 1. Senile dementia, with behavioral disturbance -not getting any better and still at home alone for all but 8 hrs per day due to cost  2. Therapeutic opioid induced constipation -will resume amitiza that she was out of, hydrate and maintain activity  3. Mixed incontinence urge and stress -cont myrbetriq which has been helpful to some degree with her incontinence and without it she's falling more  4. Multinodular thyroid - cont levothyroxine current dose and monitor -tsh normal in October  5. Falls frequently -cont use of rollator walker -fortunately has not fallen -needs to have 24 hr care, but her daughter needs guardianship first and does not want to go through this process  Labs/tests ordered:  No new today Next appt:  3 mos med mgt  Jacquese Hackman L. Francoise Chojnowski, D.O. [facility] [address], [place] [address] Cell Phone (Mon-Fri 8am-5pm):  [phone] On Call:  [phone] & follow prompts after 5pm & weekends Office Phone:  [phone] Office Fax:  [phone]

## 2015-03-01 ENCOUNTER — Ambulatory Visit (INDEPENDENT_AMBULATORY_CARE_PROVIDER_SITE_OTHER): Payer: Medicare Other

## 2015-03-01 ENCOUNTER — Telehealth: Payer: Self-pay | Admitting: *Deleted

## 2015-03-01 ENCOUNTER — Ambulatory Visit: Payer: Medicare Other

## 2015-03-01 ENCOUNTER — Ambulatory Visit (INDEPENDENT_AMBULATORY_CARE_PROVIDER_SITE_OTHER): Payer: Medicare Other | Admitting: Family Medicine

## 2015-03-01 ENCOUNTER — Other Ambulatory Visit: Payer: Self-pay | Admitting: Family Medicine

## 2015-03-01 VITALS — BP 98/62 | HR 42 | Temp 97.8°F | Resp 17 | Wt 145.0 lb

## 2015-03-01 DIAGNOSIS — M25561 Pain in right knee: Secondary | ICD-10-CM

## 2015-03-01 DIAGNOSIS — G8929 Other chronic pain: Secondary | ICD-10-CM

## 2015-03-01 DIAGNOSIS — W19XXXA Unspecified fall, initial encounter: Secondary | ICD-10-CM

## 2015-03-01 DIAGNOSIS — M25562 Pain in left knee: Secondary | ICD-10-CM

## 2015-03-01 DIAGNOSIS — S4992XA Unspecified injury of left shoulder and upper arm, initial encounter: Secondary | ICD-10-CM

## 2015-03-01 DIAGNOSIS — S4991XA Unspecified injury of right shoulder and upper arm, initial encounter: Secondary | ICD-10-CM | POA: Diagnosis not present

## 2015-03-01 DIAGNOSIS — S3992XA Unspecified injury of lower back, initial encounter: Secondary | ICD-10-CM

## 2015-03-01 DIAGNOSIS — R296 Repeated falls: Secondary | ICD-10-CM

## 2015-03-01 NOTE — Telephone Encounter (Signed)
Patient daughter, Kioni called and stated that patient fell over the weekend and now is complaining of soreness and [place] with pain in the rib area. Daughter wanted to know if she could just go ahead and take patient to Urgent care to be evaluated. Instructed her to do so.

## 2015-03-01 NOTE — Patient Instructions (Signed)
Community Occupational psychologist of Services Cost  A Matter of Balance Class locations vary. Call [place] on Aging for more information.  [email or web address]-[email or web address]/ [phone] 8-Session program addressing the fear of falling and increasing activity levels of older adults Free to minimal cost  A.C.T. By [employer] [address], [place], [place] [address].  BetaBlues.dk [phone]  Personal training, gym, classes including Silver Sneakers* and ACTion for Aging Adults Fee-based  A.H.O.Y. (Add Health to [place]) Airs on Time [employer] 13, M-F at [place]: [employer],  [place] [facility] [place],  [place], [facility], [address] [facility], [place], [place], [place] [address]  [place] Location: Sharrell Ku. [employer] [place]      [phone]  [phone]  [phone]  [phone]  [phone]  [phone]  [phone]  [phone]  [phone]  [phone]    [phone] A total-body conditioning class for adults 77 and older; designed to increase muscular strength, endurance, range of movement, flexibility, balance, agility and coordination Free  [facility] [place], [place] [address] [place]      [address]      [phone]      [place]'s class for individualsreturning to exercise after an injury, before or after surgery or for individuals with complex musculoskeletal issues; designed to improve strength, balance , flexibility      $15/class  [place] [address] [place], [place] [address] [email or web address] [place] classes for beginners to advanced [place], [place] [address] [email or web address] [place] Chair Exercises Free, ages 77 and older; Ages 43-59 fee based  Marvia Pickles, [facility] [address] [place], [place] [address] Seniorcenter_1 .Beverlee Nims [phone]  A.H.O.Y. Tai Chi Fee-based Donation based or free  [place] Class locations vary.  Call or email Angela Burke or view website for more information. Info_[email or web address] .com [email or web address] [phone] Ongoing classes at local YMCAs and gyms Fee-based  Silver Sneakers A.C.T. By [place] [employer]: [facility] [place] [phone]  [phone] Classes designed for older adults who want to improve their strength, flexibility, balance and endurance.   Silver sneakers is covered by some insurance plans and includes a fitness center membership at participating locations. Find out more by calling [phone] or visiting [email or web address] Covered by some insurance plans  [facility] [place] [phone] A.H.O.Y., fitness room, personal training, fitness classes for injury prevention, strength, balance, flexibility, water fitness classes Ages 55+: $36 for 6 months; Ages 80-54: $13 for 6 months  Tai Chi for Everybody [facility] [address] [place], [place] [address] Taichiforeverybody_3 .Patsi Sears [phone] Tai Chi classes for beginners to advanced; geared for seniors Donation Based  [employer] (Hel[employer] Others Participate in Exercise     Loyal Gambler. Rosana Hoes, PhD, [place] [email or web address] [place]     [phone]     A comprehensive fitness program for adults.  The program paris senior-level undergraduates Kinesiology students with adults who desire to learn how to exercise safely.  Includes a structural exercise class focusing on functional fitnesss     $100/semester in fall and spring; $75 in summer (no trainers)    *Silver Sneakers is covered by some Personal assistant and includes a  Radio producer at participating locations.  Find out more by calling [phone] or visiting [email or web address]  For additional health and human services resources for senior adults, please contact SeniorLine at [phone] in [place] and [place] at [phone] in all other areas.  Fall Prevention in the Home  Falls can cause injuries and can affect people from all age groups. There are many simple things that you can do to make your home safe and to help prevent falls. WHAT CAN I DO ON THE OUTSIDE OF MY HOME?  Regularly repair the edges of walkways and driveways and fix any cracks.  Remove high doorway thresholds.  Trim any shrubbery on the main path into your home.  Use bright outdoor lighting.  Clear walkways of debris and clutter, including tools and rocks.  Regularly check that handrails are securely fastened and in good repair. Both sides of any steps should have handrails.  Install guardrails along the edges of any raised decks or porches.  Have leaves, snow, and ice cleared regularly.  Use sand or salt on walkways during winter months.  In the garage, clean up any spills right away, including grease or oil spills. WHAT CAN I DO IN THE BATHROOM?  Use  night lights.  Install grab bars by the toilet and in the tub and shower. Do not use towel bars as grab bars.  Use non-skid mats or decals on the floor of the tub or shower.  If you need to sit down while you are in the shower, use a plastic, non-slip stool.Marland Kitchen  Keep the floor dry. Immediately clean up any water that spills on the floor.  Remove soap buildup in the tub or shower on a regular basis.  Attach bath mats securely with double-sided non-slip rug tape.  Remove throw rugs and other tripping hazards from the floor. WHAT CAN I DO IN THE BEDROOM?  Use night lights.  Make sure that a bedside light is easy to reach.  Do not use oversized bedding that drapes onto the floor.  Have a firm chair that has side arms to use for getting dressed.  Remove throw rugs and other tripping hazards from the floor. WHAT CAN I DO IN THE KITCHEN?   Clean up any spills right away.  Avoid walking on wet floors.  Place frequently used items in easy-to-reach places.  If you need to reach for something above you, use a sturdy step stool that has a grab bar.  Keep electrical cables out of the way.  Do not use floor polish or wax that makes floors slippery. If you have to use wax, make sure that it is non-skid floor wax.  Remove throw rugs and other tripping hazards from the floor. WHAT CAN I DO IN THE STAIRWAYS?  Do not leave any items on the stairs.  Make sure that there are handrails on both sides of the stairs. Fix handrails that are broken or loose. Make sure that handrails are as  long as the stairways.  Check any carpeting to make sure that it is firmly attached to the stairs. Fix any carpet that is loose or worn.  Avoid having throw rugs at the top or bottom of stairways, or secure the rugs with carpet tape to prevent them from moving.  Make sure that you have a light switch at the top of the stairs and the bottom of the stairs. If you do not have them, have them installed. WHAT ARE  SOME OTHER FALL PREVENTION TIPS?  Wear closed-toe shoes that fit well and support your feet. Wear shoes that have rubber soles or low heels.  When you use a stepladder, make sure that it is completely opened and that the sides are firmly locked. Have someone hold the ladder while you are using it. Do not climb a closed stepladder.  Add color or contrast paint or tape to grab bars and handrails in your home. Place contrasting color strips on the first and last steps.  Use mobility aids as needed, such as canes, walkers, scooters, and crutches.  Turn on lights if it is dark. Replace any light bulbs that burn out.  Set up furniture so that there are clear paths. Keep the furniture in the same spot.  Fix any uneven floor surfaces.  Choose a carpet design that does not hide the edge of steps of a stairway.  Be aware of any and all pets.  Review your medicines with your healthcare provider. Some medicines can cause dizziness or changes in blood pressure, which increase your risk of falling. Talk with your health care provider about other ways that you can decrease your risk of falls. This may include working with a physical therapist or trainer to improve your strength, balance, and endurance.   This information is not intended to replace advice given to you by your health care provider. Make sure you discuss any questions you have with your health care provider.   Document Released: 01/20/2002 Document Revised: 06/16/2014 Document Reviewed: 03/06/2014 Elsevier Interactive Patient Education [employer].

## 2015-03-01 NOTE — Progress Notes (Addendum)
Subjective:    Patient ID: Ariana [name], female    DOB: 1929-11-06, 82 y.o.   MRN: [id number] By signing my name below, I, [name], attest that this documentation has been prepared under the direction and in the presence of Delman Cheadle, MD.  Electronically Signed: [name], Medical Scribe. 03/01/2015. 3:09 PM.  Chief Complaint  Patient presents with  . Fall    saturday, family found her on the floor   . Knee Pain    both  . Chest Pain    rib   . Shoulder Pain    both    HPI HPI Comments: Ariana [name] is a 82 y.o. female with a history of dementia who presents to the [facility] complaining of sudden onset, worsening back pain to her bilateral flank and lower thoracic/upper lumbar area secondary to a fall that occurred 2 days ago. Patient fell backwards onto the floor while at home. The pain is worse with deep breathing. She reports having associated shoulder pain bilaterally, but worse on the right. She also reports having posterior knee pain bilaterally which has been ongoing and worse on the right. Patient denies head injury.  Patient lives at home alone, but she does have someone tend to her 8 hours a day during the day.  Depression screen [facility] 2/9 03/01/2015 08/25/2014 01/12/2014 12/08/2013 09/22/2013  Decreased Interest 0 0 0 0 1  Down, Depressed, Hopeless 0 0 0 0 1  PHQ - 2 Score 0 0 0 0 2  Altered sleeping - - - - 0  Tired, decreased energy - - - - 1  Change in appetite - - - - 1  Feeling bad or failure about yourself  - - - - 0  Trouble concentrating - - - - 1  Moving slowly or fidgety/restless - - - - 1  Suicidal thoughts - - - - 0  PHQ-9 Score - - - - 6   Past Medical History  Diagnosis Date  . Sjogren's syndrome ([place])   . Dry eye syndrome   . Hypertension, benign   . Mitral valve prolapse   . GERD (gastroesophageal reflux disease)   . Diverticulosis of colon   . Irritable bowel syndrome   . Urinary incontinence   . Low back pain  syndrome   . Fibromyalgia   . Memory loss   . Anxiety and depression   . History of adverse drug reaction   . Peripheral neuropathy (HCC)     "both feet and legs"  . Shortness of breath 07/18/11    "alot lately"  . Anginal pain ([place])   . History of recurrent UTIs   . H/O hiatal hernia   . Anxiety   . Dementia   . Depression   . Abnormality of gait   . Thyroid nodule   . Headache(784.0) 09/05/2012  . Adenomatous polyp of colon 2002    29mm  . History of cerebrovascular disease 09/24/2014   Past Surgical History  Procedure Laterality Date  . Vesicovaginal fistula closure w/ tah    . Appendectomy    . Cholecystectomy  2000  . Mandible surgery    . Temporomandibular joint surgery  1986    Dr. Terence Lux  . Cataract extraction, bilateral    . Abdominal hysterectomy  1967  . Dental surgery      multiple tooth extractions  . Esophagogastroduodenoscopy (egd) with esophageal dilation N/A 08/23/2012    Procedure: ESOPHAGOGASTRODUODENOSCOPY (EGD) WITH ESOPHAGEAL DILATION;  Surgeon: Milus Banister, MD;  Location: [facility];  Service: Endoscopy;  Laterality: N/A;  . Colonoscopy w/ biopsies      multiple  . Nasal septum surgery  1980  . Transthoracic echocardiogram  2001    mild LVH, normal LV  . Nm myocar perf wall motion  2003    persantine - normal static and dynamic study w/apical thinning and presvered LV function, no ischemia  . Cardiac catheterization  02/17/2003    normal L main, LAD free of disease, Cfx free of disease, RCA free of disease (Dr. Rockne Menghini)   Current Outpatient Prescriptions on File Prior to Visit  Medication Sig Dispense Refill  . ALPRAZolam (XANAX) 0.5 MG tablet Take 1 tablet (0.5 mg total) by mouth 2 (two) times daily as needed for anxiety. 60 tablet 0  . aspirin 325 MG tablet Take 1 tablet (325 mg total) by mouth daily. 30 tablet 2  . bisacodyl (BISACODYL) 5 MG EC tablet Take 2 tablets (10 mg total) by mouth daily as needed for moderate constipation. If no  effective bowel movement every 2- 3 days (Patient taking differently: Take 5-10 mg by mouth daily as needed for moderate constipation. If no effective bowel movement every 2- 3 days) 30 tablet 0  . CALCIUM CARBONATE PO Take 1 tablet by mouth daily.    . carboxymethylcellulose (REFRESH TEARS) 0.5 % SOLN Place 1 drop into both eyes 2 (two) times daily as needed (dry eyes).     . cetirizine (ZYRTEC) 10 MG tablet Take 1 tablet (10 mg total) by mouth daily. 14 tablet 0  . CRANBERRY SOFT PO Take 1 capsule by mouth daily.    Marland Kitchen diltiazem (CARDIZEM CD) 180 MG 24 hr capsule Take 1 capsule (180 mg total) by mouth daily. 90 capsule 0  . DULoxetine (CYMBALTA) 30 MG capsule Take one tablet  once daily in the morning for depression (Patient taking differently: Take 30 mg by mouth every morning. Take one tablet  once daily in the morning for depression) 30 capsule 5  . DULoxetine (CYMBALTA) 60 MG capsule Take one tablet by mouth in the evening once daily for depression (Patient taking differently: Take 60 mg by mouth every evening. Take one tablet by mouth in the evening once daily for depression) 30 capsule 5  . HYDROcodone-acetaminophen (NORCO/VICODIN) 5-325 MG tablet Take 1 tablet by mouth every 6 (six) hours as needed for moderate pain. Take one tablet twice daily for pain 60 tablet 0  . hydroxychloroquine (PLAQUENIL) 200 MG tablet Take 200 mg by mouth. Take one tablet twice daily Monday thru Friday, only    . lactose free nutrition (BOOST PLUS) LIQD Take 237 mLs by mouth 2 (two) times daily between meals.  0  . lansoprazole (PREVACID) 30 MG capsule TAKE ONE CAPSULE BY MOUTH EVERY DAY AT 12 NOON 30 capsule 5  . lubiprostone (AMITIZA) 24 MCG capsule Take 1 capsule (24 mcg total) by mouth 2 (two) times daily with a meal. 60 capsule 11  . LYRICA 75 MG capsule TAKE ONE CAPSULE BY MOUTH TWICE A DAY 60 capsule 2  . memantine (NAMENDA XR) 28 MG CP24 24 hr capsule Take one capsule by mouth once daily to preserve memory  (Patient taking differently: Take 28 mg by mouth daily. Take one capsule by mouth once daily to preserve memory) 90 capsule 3  . metoprolol (LOPRESSOR) 50 MG tablet Take 1 tablet (50 mg total) by mouth 2 (two) times daily. 60 tablet 6  .  mirabegron ER (MYRBETRIQ) 25 MG TB24 tablet Take 1 tablet (25 mg total) by mouth daily. 30 tablet 3  . nystatin (MYCOSTATIN) 100000 UNIT/ML suspension TAKE 1 TEASPOONFUL BY MOUTH 4 TIMES A DAY as needed for mouth pain (Patient taking differently: Take 5 mLs by mouth 4 (four) times daily as needed (for mouth pain). ) 240 mL 5  . pilocarpine (SALAGEN) 5 MG tablet Take 5 mg by mouth 2 (two) times daily.    . pravastatin (PRAVACHOL) 20 MG tablet Take 1 tablet (20 mg total) by mouth daily at 6 PM. 30 tablet 3  . sodium chloride (OCEAN) 0.65 % SOLN nasal spray Place 1 spray into the nose as needed for congestion. 30 mL 0  . triamcinolone ointment (KENALOG) 0.1 % Apply 1 application topically 2 (two) times daily as needed (rash).   1   No current facility-administered medications on file prior to visit.   Allergies  Allergen Reactions  . Banana Nausea And Vomiting  . Codeine Nausea Only    unless given with Phenergan  . Klonopin [Clonazepam] Other (See Comments)    Causes hallucination   . Meperidine Hcl Nausea Only    unless given with Phenergan  . Norflex [Orphenadrine Citrate] Nausea Only    Unless given with Phenergan  . Oxycodone-Acetaminophen Nausea Only    unless given with phenergan  . Propoxyphene Hcl Nausea Only    unless given with phenergan  . Zoloft [Sertraline Hcl] Other (See Comments)    Caused lethargy  . Doxycycline Other (See Comments)    Unknown  . Naproxen Other (See Comments)  . Penicillins Other (See Comments)    Unknown  . Phenothiazines Other (See Comments)    Unknown  . Stelazine Other (See Comments)    Unknown  . Sulfamethoxazole-Trimethoprim Other (See Comments)    Unknown  . Tolectin [Tolmetin Sodium] Other (See Comments)      Unknown  . Tramadol Other (See Comments)    Unknown   Family History  Problem Relation Age of Onset  . Heart disease Father     heart attack  . Pneumonia Mother   . Heart attack Mother   . Hypertension Mother   . Hypertension Maternal Grandmother   . Colon cancer Sister   . Kidney disease Daughter   . Asthma Daughter   . Arthritis Daughter 13    osteo,  . Heart disease Son 16    stage 3 CHF(Diastolic /Systolic)  . Throat cancer Brother    Social History   Social History  . Marital Status: Widowed    Spouse Name: N/A  . Number of Children: 2  . Years of Education: 12   Occupational History  . Retired    Social History Main Topics  . Smoking status: Former Research scientist (life sciences)  . Smokeless tobacco: Never Used     Comment: Quit at age 51   . Alcohol Use: No  . Drug Use: No  . Sexual Activity: No   Other Topics Concern  . 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    Patient is Widowed.    Patient has 2 children.    Patient is retired.    Former smoker   Alcohol none   Exercise Walk, exercise chair 4 days a week   POA    Walks with cane      Patient drinks about 1-2 cups of hot tea daily.   Patient is right  handed.                     Review of Systems  Constitutional: Positive for activity change and fatigue. Negative for appetite change and unexpected weight change.  Respiratory: Negative for cough, shortness of breath and wheezing.   Cardiovascular: Positive for leg swelling. Negative for chest pain.  Gastrointestinal: Negative for vomiting and abdominal pain.  Musculoskeletal: Positive for myalgias, back pain, joint swelling, arthralgias, gait problem, neck pain and neck stiffness.  Skin: Negative for color change, rash and wound.  Neurological: Positive for tremors, weakness and numbness. Negative for dizziness, syncope, speech difficulty, light-headedness and headaches.  Hematological: Bruises/bleeds easily.   Psychiatric/Behavioral: Positive for behavioral problems, confusion and agitation. Negative for sleep disturbance and dysphoric mood. The patient is not hyperactive.        Objective:  BP 98/62 mmHg  Pulse 42  Temp(Src) 97.8 F (36.6 C) (Oral)  Resp 17  Wt 145 lb (65.772 kg)  SpO2 98%  Physical Exam  Constitutional: She is oriented to person, place, and time. She appears well-developed and well-nourished. No distress.  HENT:  Head: Normocephalic and atraumatic.  Mouth/Throat: Oropharynx is clear and moist. No oropharyngeal exudate.  Eyes: Pupils are equal, round, and reactive to light.  Neck: Neck supple.  Cardiovascular: An irregular rhythm present. Bradycardia present.   Pulmonary/Chest: Effort normal.  Decreased air movement, but equal throughout all lobes.  Musculoskeletal: She exhibits no edema.  Shoulders: Slightly decreased ROM in the right shoulder with abduction compared to left. Full interior and exterior rotation. No point tenderness over clavicle, AC joint, acromion, and suprascapula.   Back: No point tenderness over the lumbar spine. Point tenderness over thoracic spine around T6. Paraspinal muscle spasms around same area. No point tenderness over flank or lower ribs.  Knees: TTP over popliteal fossa. Unable to determine if Baker's cyst is present. Question of possible fullness of Baker's cyst. Great forward ROM of bilateral knees with minimal crepitus.   Neurological: She is alert and oriented to person, place, and time. No cranial nerve deficit.  4/5 hamstrings, 5/5 quads bilaterally.  Skin: Skin is warm and dry. No rash noted.  No rashes. Minimal varicose veins bilaterally. Bruise over anterior medial joint line. Bruise medial to the left lower scapula.  Psychiatric: She has a normal mood and affect. Her behavior is normal.  Nursing note and vitals reviewed.  [facility] (PRIMARY) x-ray report read by Dr. Brigitte Pulse:  Ribs - No rib fractures seen. Right shoulder - No acute  abnormality. Thoracic spine - Degenerative disc disease and arthritis, but no acute abnormality. Osteoporosis. Knees - Bilateral medial osteoarthritis. Nonspecific calcific changes in the popliteal area.  Left shoulder - No acute abnormality.    Assessment & Plan:   1. Falls frequently   2. Fall, initial encounter   3. Back injury, initial encounter   4. Shoulder injury, left, initial encounter   5. Shoulder injury, right, initial encounter   6. Knee pain, chronic, left   7. Knee pain, chronic, right    No significantly acute injury sustained at this time fortunately. Pt is sore all over from her fall but no new injury identified on exam today.  I am quite concerned about pt - she is living alone and in office today she required max assist with 2 people to stand up from a chair and assist with 1 person and walker to ambulate.  Referred back to home health PT but daughter reports she just  had this less than 2 mos ago and pt usu won't work with PT due to pain.  Pt is already on cymbalta and lyrica as well as has prn hydrocodone at home so there is really nothing else to offer her in terms of pain relief.  I discussed w/ pt and her daughter that pt seems most bothered by her bilateral popliteal pain and her hamstring weakness which is really quite significant but I have no idea about it's etiology, prior eval, or if pt is likely to get any sig improvement in this going forward. It does not seems to be related to her recent fall. I suspect her falls are due to her peripheral neuropathy rather than her knees as daughter suggests.  If pt would continue to work with PT for balance as well as hamstring strengthening she could do better but it sounds like pt does not have a good track record for participation prior. Pt is also with moderate dementia - only oriented to self today, unable to provide me any of her history at all - even unable to answer any questions about her acute MSK complaints today, though  she does follow directions well. She has absolutely no idea what medicines she is taking or when.  I advised daughter that I think pt is high risk living alone and it is unsafe to leave pt unattended overnight - is definitely going to fall again - would really benefit from assisted living/snf though this does not seem to be something that they are considering.  Orders Placed This Encounter  Procedures  . DG Thoracic Spine 2 View    Standing Status: Future     Number of Occurrences: 1     Standing Expiration Date: 02/29/2016    Order Specific Question:  Reason for Exam (SYMPTOM  OR DIAGNOSIS REQUIRED)    Answer:  pain posterior after fall backwards    Order Specific Question:  Preferred imaging location?    Answer:  External  . DG Knee 1-2 Views Left    Standing Status: Future     Number of Occurrences: 1     Standing Expiration Date: 02/29/2016    Order Specific Question:  Reason for Exam (SYMPTOM  OR DIAGNOSIS REQUIRED)    Answer:  pain posterior after fall backwards    Order Specific Question:  Preferred imaging location?    Answer:  External  . DG Ribs Bilateral W/Chest    Standing Status: Future     Number of Occurrences: 1     Standing Expiration Date: 02/29/2016    Order Specific Question:  Reason for Exam (SYMPTOM  OR DIAGNOSIS REQUIRED)    Answer:  pain posterior after fall backwards    Order Specific Question:  Preferred imaging location?    Answer:  External  . DG Shoulder Left    Standing Status: Future     Number of Occurrences: 1     Standing Expiration Date: 02/29/2016    Order Specific Question:  Reason for Exam (SYMPTOM  OR DIAGNOSIS REQUIRED)    Answer:  pain posterior after fall backwards    Order Specific Question:  Preferred imaging location?    Answer:  External  . DG Shoulder Right    Standing Status: Future     Number of Occurrences: 1     Standing Expiration Date: 02/29/2016    Order Specific Question:  Reason for Exam (SYMPTOM  OR DIAGNOSIS REQUIRED)     Answer:  pain posterior after fall backwards  Order Specific Question:  Preferred imaging location?    Answer:  External  . Ambulatory referral to Home Health    Referral Priority:  Routine    Referral Type:  Home Health Care    Referral Reason:  Specialty Services Required    Requested Specialty:  [place]    Number of Visits Requested:  1  . Care order/instruction    AVS and GO    Scheduling Instructions:     AVS and GO     I personally performed the services described in this documentation, which was scribed in my presence. The recorded information has been reviewed and considered, and addended by me as needed.  Delman Cheadle, MD MPH

## 2015-03-01 NOTE — Telephone Encounter (Signed)
Noted.  She probably has a broken rib.  Unfortunately, nothing would be done with this other than pain mgt.

## 2015-03-02 ENCOUNTER — Telehealth: Payer: Self-pay

## 2015-03-02 NOTE — Telephone Encounter (Signed)
whatever works for pt, thanks.

## 2015-03-02 NOTE — Telephone Encounter (Signed)
Maria from [facility] Therapy called to let Dr. Brigitte Pulse know that this patient has requested that her start date be 03/03/15 instead of 03/02/15, stated that this was from a referral that we set up so I will send to both clinical and referral dept.

## 2015-03-04 NOTE — Telephone Encounter (Signed)
[facility] called to say patient will delay treatment due to the pain.  Will try again  [phone]

## 2015-03-09 ENCOUNTER — Telehealth: Payer: Self-pay | Admitting: *Deleted

## 2015-03-09 ENCOUNTER — Telehealth: Payer: Self-pay

## 2015-03-09 NOTE — Telephone Encounter (Signed)
Ariana [name] with [facility] called and stated that she needed verbal orders for patient to receive PT 1x 1wk and 2x 7wks for gait training. Dr. Brigitte Pulse ordered [place] on 03/01/2015. Nurse to call there for verbal orders.

## 2015-03-09 NOTE — Telephone Encounter (Signed)
Maria with [facility] called and wanted verbal orders for 1x wk and 2x 7wk for gait training. Dr. Raul Del office ordered the [facility] on 03/01/2015 to call their office for verbal orders.

## 2015-03-09 NOTE — Telephone Encounter (Addendum)
Ariana [name] from [facility] states they want to do [place] [facility]. For pt 2 times a week for 7 weeks starting this week. It is for helping her balance, would like to know if Dr Brigitte Pulse would sign the order to Have it done. Please call [phone]

## 2015-03-09 NOTE — Telephone Encounter (Signed)
Yes, will authorize

## 2015-03-10 NOTE — Telephone Encounter (Signed)
Left detailed verbal orders for patient on voicemail.  

## 2015-03-15 ENCOUNTER — Telehealth: Payer: Self-pay

## 2015-03-15 NOTE — Telephone Encounter (Signed)
Ariana [name] with [facility] called asking for order for occupational therpist for ADL and social worker   Best number [phone]

## 2015-03-15 NOTE — Telephone Encounter (Signed)
We are not her primary provider.

## 2015-03-19 NOTE — Telephone Encounter (Signed)
Left VM on Ariana [name]'s phone informing her we are not pt's primary provider

## 2015-03-24 ENCOUNTER — Telehealth: Payer: Self-pay | Admitting: *Deleted

## 2015-03-24 NOTE — Telephone Encounter (Signed)
Ariana [name], daughter called and stated that patient needs prior authorization for Namenda and Myrbetriq. Initiated through [employer] [facility] [employer] Myrbetriq Key: [id number] Namenda Key: [id number] Awaiting determination. Daughter notified and agreed.

## 2015-03-30 ENCOUNTER — Telehealth: Payer: Self-pay | Admitting: Neurology

## 2015-03-30 ENCOUNTER — Ambulatory Visit: Payer: Medicare Other | Admitting: Neurology

## 2015-03-30 NOTE — Telephone Encounter (Signed)
Pt's daughter called said pt's caregiver is out sick today and the person that has come to assist today she doesn't know. She said this has put pt in a "tail spin" and is wanting to r/s the appt. Dr Jannifer Franklin is booked until June. Could you please work her in after this week please.

## 2015-03-30 NOTE — Telephone Encounter (Signed)
I called the patient's daughter. Appointment r/s to 2/22.

## 2015-04-01 ENCOUNTER — Encounter: Payer: Self-pay | Admitting: *Deleted

## 2015-04-02 ENCOUNTER — Telehealth: Payer: Self-pay | Admitting: *Deleted

## 2015-04-02 NOTE — Telephone Encounter (Signed)
If this is significant bleeding with clots, she will need evaluation at the emergency room.

## 2015-04-02 NOTE — Telephone Encounter (Signed)
Janus, daughter notified and agreed. She stated that she will keep an eye on her.

## 2015-04-02 NOTE — Telephone Encounter (Signed)
Patient daughter, larue called and stated that her mother is having rectal bleeding, much more that in the past. Patient is not straining now to go. Please Advise.

## 2015-04-05 DIAGNOSIS — N958 Other specified menopausal and perimenopausal disorders: Secondary | ICD-10-CM | POA: Diagnosis not present

## 2015-04-05 DIAGNOSIS — M8588 Other specified disorders of bone density and structure, other site: Secondary | ICD-10-CM | POA: Diagnosis not present

## 2015-04-05 DIAGNOSIS — Z13820 Encounter for screening for osteoporosis: Secondary | ICD-10-CM | POA: Diagnosis not present

## 2015-04-05 LAB — HM DEXA SCAN

## 2015-04-07 ENCOUNTER — Ambulatory Visit (INDEPENDENT_AMBULATORY_CARE_PROVIDER_SITE_OTHER): Payer: Medicare Other | Admitting: Neurology

## 2015-04-07 ENCOUNTER — Encounter: Payer: Self-pay | Admitting: Neurology

## 2015-04-07 VITALS — BP 142/73 | HR 74 | Ht 63.0 in | Wt 152.0 lb

## 2015-04-07 DIAGNOSIS — M797 Fibromyalgia: Secondary | ICD-10-CM

## 2015-04-07 DIAGNOSIS — R269 Unspecified abnormalities of gait and mobility: Secondary | ICD-10-CM | POA: Diagnosis not present

## 2015-04-07 DIAGNOSIS — F0390 Unspecified dementia without behavioral disturbance: Secondary | ICD-10-CM | POA: Diagnosis not present

## 2015-04-07 DIAGNOSIS — F03A0 Unspecified dementia, mild, without behavioral disturbance, psychotic disturbance, mood disturbance, and anxiety: Secondary | ICD-10-CM

## 2015-04-07 MED ORDER — DULOXETINE HCL 30 MG PO CPEP: ORAL_CAPSULE | ORAL | Status: DC

## 2015-04-07 MED ORDER — MEMANTINE HCL 10 MG PO TABS: 10.0000 mg | ORAL_TABLET | Freq: Two times a day (BID) | ORAL | Status: DC

## 2015-04-07 MED ORDER — DULOXETINE HCL 60 MG PO CPEP: ORAL_CAPSULE | ORAL | Status: DC

## 2015-04-07 NOTE — Progress Notes (Signed)
Reason for visit: Gait disorder  Ariana [name] is an 82 y.o. female  History of present illness:  Ms. Ariana [name] is an 82 year old right-handed white female with a history of problems with a peripheral neuropathy associated with a gait disorder. The patient has cerebrovascular disease associated with atrial fibrillation. 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 The patient has not been anticoagulated for the atrial fibrillation, she remains on aspirin. The patient still has some issues with falling on occasion. The patient is followed by cardiology. She indicates that the neuropathy discomfort is fairly well controlled with the Cymbalta, she is sleeping fairly well at night. She reports pain in the knees when trying to stand up from a seated position. The patient uses a walker for ambulation. She has not had any recent falls while using a walker. She does report chronic constipation issues that have become a significant. She has a mild memory problem, this has slightly progressed since last seen. The patient is on Namenda.  Past Medical History  Diagnosis Date  . Sjogren's syndrome ([place])   . Dry eye syndrome   . Hypertension, benign   . Mitral valve prolapse   . GERD (gastroesophageal reflux disease)   . Diverticulosis of colon   . Irritable bowel syndrome   . Urinary incontinence   . Low back pain syndrome   . Fibromyalgia   . Memory loss   . Anxiety and depression   . History of adverse drug reaction   . Peripheral neuropathy ([facility])     "both feet and legs"  . Shortness of breath 07/18/11    "alot lately"  . Anginal pain ([place])   . History of recurrent UTIs   . H/O hiatal hernia   . Anxiety   . Dementia   . Depression   . Abnormality of gait   . Thyroid nodule   . Headache(784.0) 09/05/2012  . Adenomatous polyp of colon 2002    18mm    . History of cerebrovascular disease 09/24/2014    Past Surgical History  Procedure Laterality Date  . Vesicovaginal fistula closure w/ tah    . Appendectomy    . Cholecystectomy  2000  . Mandible surgery    . Temporomandibular joint surgery  1986    Dr. Terence Lux  . Cataract extraction, bilateral    . Abdominal hysterectomy  1967  . Dental surgery      multiple tooth extractions  . Esophagogastroduodenoscopy (egd) with esophageal dilation N/A 08/23/2012    Procedure: ESOPHAGOGASTRODUODENOSCOPY (EGD) WITH ESOPHAGEAL DILATION;  Surgeon: Milus Banister, MD;  Location: [facility];  Service: Endoscopy;  Laterality: N/A;  . Colonoscopy w/ biopsies      multiple  . Nasal septum surgery  1980  . Transthoracic echocardiogram  2001    mild LVH, normal LV  . Nm myocar perf wall motion  2003    persantine - normal static and dynamic study w/apical thinning and presvered LV function, no ischemia  . Cardiac catheterization  02/17/2003    normal L main, LAD free of disease, Cfx free of disease, RCA free of disease (Dr. Rockne Menghini)    Family History  Problem Relation Age of Onset  . Heart disease Father     heart attack  . Pneumonia Mother   . Heart attack Mother   . Hypertension Mother   .  Hypertension Maternal Grandmother   . Colon cancer Sister   . Kidney disease Daughter   . Asthma Daughter   . Arthritis Daughter 85    osteo,  . Heart disease Son 30    stage 3 CHF(Diastolic /Systolic)  . Throat cancer Brother     Social history:  reports that she has quit smoking. She has never used smokeless tobacco. She reports that she does not drink alcohol or use illicit drugs.    Allergies  Allergen Reactions  . Banana Nausea And Vomiting  . Codeine Nausea Only    unless given with Phenergan  . Klonopin [Clonazepam] Other (See Comments)    Causes hallucination   . Meperidine Hcl Nausea Only    unless given with Phenergan  . Norflex [Orphenadrine Citrate] Nausea Only    Unless  given with Phenergan  . Oxycodone-Acetaminophen Nausea Only    unless given with phenergan  . Propoxyphene Hcl Nausea Only    unless given with phenergan  . Zoloft [Sertraline Hcl] Other (See Comments)    Caused lethargy  . Doxycycline Other (See Comments)    Unknown  . Naproxen Other (See Comments)  . Penicillins Other (See Comments)    Unknown  . Phenothiazines Other (See Comments)    Unknown  . Stelazine Other (See Comments)    Unknown  . Sulfamethoxazole-Trimethoprim Other (See Comments)    Unknown  . Tolectin [Tolmetin Sodium] Other (See Comments)    Unknown  . Tramadol Other (See Comments)    Unknown    Medications:  Prior to Admission medications   Medication Sig Start Date End Date Taking? Authorizing Provider  ALPRAZolam Duanne Moron) 0.5 MG tablet Take 1 tablet (0.5 mg total) by mouth 2 (two) times daily as needed for anxiety. 01/21/15  Yes Tiffany L Reed, DO  aspirin 325 MG tablet Take 1 tablet (325 mg total) by mouth daily. 07/28/14  Yes Reyne Dumas, MD  bisacodyl (BISACODYL) 5 MG EC tablet Take 2 tablets (10 mg total) by mouth daily as needed for moderate constipation. If no effective bowel movement every 2- 3 days Patient taking differently: Take 5-10 mg by mouth daily as needed for moderate constipation. If no effective bowel movement every 2- 3 days 07/08/14  Yes Gatha Mayer, MD  CALCIUM CARBONATE PO Take 1 tablet by mouth daily.   Yes Historical Provider, MD  carboxymethylcellulose (REFRESH TEARS) 0.5 % SOLN Place 1 drop into both eyes 2 (two) times daily as needed (dry eyes).    Yes Historical Provider, MD  cetirizine (ZYRTEC) 10 MG tablet Take 1 tablet (10 mg total) by mouth daily. 03/23/14  Yes Tiffany L Reed, DO  CRANBERRY SOFT PO Take 1 capsule by mouth daily.   Yes Historical Provider, MD  diltiazem (CARDIZEM CD) 180 MG 24 hr capsule Take 1 capsule (180 mg total) by mouth daily. 01/04/15  Yes Pixie Casino, MD  DULoxetine (CYMBALTA) 30 MG capsule Take one tablet   once daily in the morning for depression Patient taking differently: Take 30 mg by mouth every morning. Take one tablet  once daily in the morning for depression 09/24/14  Yes Kathrynn Ducking, MD  DULoxetine (CYMBALTA) 60 MG capsule Take one tablet by mouth in the evening once daily for depression Patient taking differently: Take 60 mg by mouth every evening. Take one tablet by mouth in the evening once daily for depression 09/24/14  Yes Kathrynn Ducking, MD  HYDROcodone-acetaminophen (NORCO/VICODIN) 5-325 MG tablet Take 1 tablet by  mouth every 6 (six) hours as needed for moderate pain. Take one tablet twice daily for pain 01/29/15  Yes Gildardo Cranker, DO  hydroxychloroquine (PLAQUENIL) 200 MG tablet Take 200 mg by mouth. Take one tablet twice daily Monday thru Friday, only   Yes Historical Provider, MD  lactose free nutrition (BOOST PLUS) LIQD Take 237 mLs by mouth 2 (two) times daily between meals. 07/22/13  Yes Maryann Mikhail, DO  lansoprazole (PREVACID) 30 MG capsule TAKE ONE CAPSULE BY MOUTH EVERY DAY AT 12 NOON 02/18/15  Yes Tiffany L Reed, DO  lubiprostone (AMITIZA) 24 MCG capsule Take 1 capsule (24 mcg total) by mouth 2 (two) times daily with a meal. 07/08/14  Yes Gatha Mayer, MD  LYRICA 75 MG capsule TAKE ONE CAPSULE BY MOUTH TWICE A DAY 02/05/15  Yes Tiffany L Reed, DO  memantine (NAMENDA XR) 28 MG CP24 24 hr capsule Take one capsule by mouth once daily to preserve memory Patient taking differently: Take 28 mg by mouth daily. Take one capsule by mouth once daily to preserve memory 11/13/14  Yes Tiffany L Reed, DO  metoprolol (LOPRESSOR) 50 MG tablet Take 1 tablet (50 mg total) by mouth 2 (two) times daily. 01/04/15  Yes Pixie Casino, MD  mirabegron ER (MYRBETRIQ) 25 MG TB24 tablet Take 1 tablet (25 mg total) by mouth daily. 01/04/15  Yes Tiffany L Reed, DO  nystatin (MYCOSTATIN) 100000 UNIT/ML suspension TAKE 1 TEASPOONFUL BY MOUTH 4 TIMES A DAY as needed for mouth pain Patient taking  differently: Take 5 mLs by mouth 4 (four) times daily as needed (for mouth pain).  09/04/14  Yes Tiffany L Reed, DO  pilocarpine (SALAGEN) 5 MG tablet Take 5 mg by mouth 2 (two) times daily.   Yes Historical Provider, MD  pravastatin (PRAVACHOL) 20 MG tablet Take 1 tablet (20 mg total) by mouth daily at 6 PM. 01/01/15  Yes Tiffany L Reed, DO  sodium chloride (OCEAN) 0.65 % SOLN nasal spray Place 1 spray into the nose as needed for congestion. 08/23/12  Yes Modena Jansky, MD  triamcinolone ointment (KENALOG) 0.1 % Apply 1 application topically 2 (two) times daily as needed (rash).  04/01/14  Yes Historical Provider, MD    ROS:  Out of a complete 14 system review of symptoms, the patient complains only of the following symptoms, and all other reviewed systems are negative.  Memory disorder Gait disorder Knee pain  Blood pressure 142/73, pulse 74, height 5\' 3"  (1.6 m), weight 152 lb (68.947 kg).  Physical Exam  General: The patient is alert and cooperative at the time of the examination.  Skin: Slight edema at the ankles is noted bilaterally.   Neurologic Exam  Mental status: The patient is alert and oriented x 2 at the time of the examination (not oriented to date). The Mini-Mental Status Examination done today shows a total score 22/30. The patient is able to name 9 animals in 30 seconds.   Cranial nerves: Facial symmetry is present. Speech is normal, no aphasia or dysarthria is noted. Extraocular movements are full. Visual fields are full.  Motor: The patient has good strength in all 4 extremities.  Sensory examination: Soft touch sensation is symmetric on the face, arms, and legs.  Coordination: The patient has good finger-nose-finger and heel-to-shin bilaterally.  Gait and station: The patient has a slightly wide-based, unsteady gait. The patient walks with a walker. Tandem gait was not attempted. Romberg is unsteady, the patient has a tendency to fall.  No drift is  seen.  Reflexes: Deep tendon reflexes are symmetric, but are depressed.   Assessment/Plan:  1. Gait disorder  2. Peripheral neuropathy  3. Memory disorder  4. Chronic constipation  5. Fibromyalgia  6. Atrial fibrillation, strokes  The patient was given a prescription for the Cymbalta taking 30 mg in the morning and 60 mg in the evening. She is unable to afford the brand name Namenda, we will switch her to the generic 10 mg twice daily dosing. The patient will follow-up in 6 months.  Jill Alexanders MD 04/07/2015 7:24 PM  [facility] [address] [place], [place] [address]  Phone [phone] Fax [phone]

## 2015-04-07 NOTE — Patient Instructions (Signed)
Fall Prevention in the Home  Falls can cause injuries and can affect people from all age groups. There are many simple things that you can do to make your home safe and to help prevent falls. WHAT CAN I DO ON THE OUTSIDE OF MY HOME?  Regularly repair the edges of walkways and driveways and fix any cracks.  Remove high doorway thresholds.  Trim any shrubbery on the main path into your home.  Use bright outdoor lighting.  Clear walkways of debris and clutter, including tools and rocks.  Regularly check that handrails are securely fastened and in good repair. Both sides of any steps should have handrails.  Install guardrails along the edges of any raised decks or porches.  Have leaves, snow, and ice cleared regularly.  Use sand or salt on walkways during winter months.  In the garage, clean up any spills right away, including grease or oil spills. WHAT CAN I DO IN THE BATHROOM?  Use night lights.  Install grab bars by the toilet and in the tub and shower. Do not use towel bars as grab bars.  Use non-skid mats or decals on the floor of the tub or shower.  If you need to sit down while you are in the shower, use a plastic, non-slip stool..  Keep the floor dry. Immediately clean up any water that spills on the floor.  Remove soap buildup in the tub or shower on a regular basis.  Attach bath mats securely with double-sided non-slip rug tape.  Remove throw rugs and other tripping hazards from the floor. WHAT CAN I DO IN THE BEDROOM?  Use night lights.  Make sure that a bedside light is easy to reach.  Do not use oversized bedding that drapes onto the floor.  Have a firm chair that has side arms to use for getting dressed.  Remove throw rugs and other tripping hazards from the floor. WHAT CAN I DO IN THE KITCHEN?   Clean up any spills right away.  Avoid walking on wet floors.  Place frequently used items in easy-to-reach places.  If you need to reach for something  above you, use a sturdy step stool that has a grab bar.  Keep electrical cables out of the way.  Do not use floor polish or wax that makes floors slippery. If you have to use wax, make sure that it is non-skid floor wax.  Remove throw rugs and other tripping hazards from the floor. WHAT CAN I DO IN THE STAIRWAYS?  Do not leave any items on the stairs.  Make sure that there are handrails on both sides of the stairs. Fix handrails that are broken or loose. Make sure that handrails are as long as the stairways.  Check any carpeting to make sure that it is firmly attached to the stairs. Fix any carpet that is loose or worn.  Avoid having throw rugs at the top or bottom of stairways, or secure the rugs with carpet tape to prevent them from moving.  Make sure that you have a light switch at the top of the stairs and the bottom of the stairs. If you do not have them, have them installed. WHAT ARE SOME OTHER FALL PREVENTION TIPS?  Wear closed-toe shoes that fit well and support your feet. Wear shoes that have rubber soles or low heels.  When you use a stepladder, make sure that it is completely opened and that the sides are firmly locked. Have someone hold the ladder while you   are using it. Do not climb a closed stepladder.  Add color or contrast paint or tape to grab bars and handrails in your home. Place contrasting color strips on the first and last steps.  Use mobility aids as needed, such as canes, walkers, scooters, and crutches.  Turn on lights if it is dark. Replace any light bulbs that burn out.  Set up furniture so that there are clear paths. Keep the furniture in the same spot.  Fix any uneven floor surfaces.  Choose a carpet design that does not hide the edge of steps of a stairway.  Be aware of any and all pets.  Review your medicines with your healthcare provider. Some medicines can cause dizziness or changes in blood pressure, which increase your risk of falling. Talk  with your health care provider about other ways that you can decrease your risk of falls. This may include working with a physical therapist or trainer to improve your strength, balance, and endurance.   This information is not intended to replace advice given to you by your health care provider. Make sure you discuss any questions you have with your health care provider.   Document Released: 01/20/2002 Document Revised: 06/16/2014 Document Reviewed: 03/06/2014 Elsevier Interactive Patient Education 2016 [employer]  

## 2015-04-09 ENCOUNTER — Other Ambulatory Visit: Payer: Self-pay | Admitting: Internal Medicine

## 2015-04-09 MED ORDER — DILTIAZEM HCL ER COATED BEADS 180 MG PO CP24: 180.0000 mg | ORAL_CAPSULE | Freq: Every day | ORAL | Status: DC

## 2015-04-18 ENCOUNTER — Inpatient Hospital Stay (HOSPITAL_COMMUNITY)
Admission: EM | Admit: 2015-04-18 | Discharge: 2015-04-20 | DRG: 071 | Disposition: A | Discharge status: Skilled Nursing Facility | Payer: Medicare Other | Attending: Internal Medicine | Admitting: Internal Medicine

## 2015-04-18 ENCOUNTER — Encounter (HOSPITAL_COMMUNITY): Payer: Self-pay

## 2015-04-18 ENCOUNTER — Emergency Department (HOSPITAL_COMMUNITY): Payer: Medicare Other

## 2015-04-18 ENCOUNTER — Inpatient Hospital Stay (HOSPITAL_COMMUNITY): Payer: Medicare Other

## 2015-04-18 DIAGNOSIS — I10 Essential (primary) hypertension: Secondary | ICD-10-CM | POA: Diagnosis present

## 2015-04-18 DIAGNOSIS — Z7982 Long term (current) use of aspirin: Secondary | ICD-10-CM | POA: Diagnosis not present

## 2015-04-18 DIAGNOSIS — Z888 Allergy status to other drugs, medicaments and biological substances status: Secondary | ICD-10-CM

## 2015-04-18 DIAGNOSIS — Z825 Family history of asthma and other chronic lower respiratory diseases: Secondary | ICD-10-CM

## 2015-04-18 DIAGNOSIS — E860 Dehydration: Secondary | ICD-10-CM | POA: Diagnosis present

## 2015-04-18 DIAGNOSIS — F419 Anxiety disorder, unspecified: Secondary | ICD-10-CM | POA: Diagnosis present

## 2015-04-18 DIAGNOSIS — R488 Other symbolic dysfunctions: Secondary | ICD-10-CM | POA: Diagnosis not present

## 2015-04-18 DIAGNOSIS — Z90710 Acquired absence of both cervix and uterus: Secondary | ICD-10-CM | POA: Diagnosis not present

## 2015-04-18 DIAGNOSIS — M545 Low back pain: Secondary | ICD-10-CM | POA: Diagnosis present

## 2015-04-18 DIAGNOSIS — R531 Weakness: Secondary | ICD-10-CM

## 2015-04-18 DIAGNOSIS — H04129 Dry eye syndrome of unspecified lacrimal gland: Secondary | ICD-10-CM | POA: Diagnosis present

## 2015-04-18 DIAGNOSIS — Z87440 Personal history of urinary (tract) infections: Secondary | ICD-10-CM

## 2015-04-18 DIAGNOSIS — Z881 Allergy status to other antibiotic agents status: Secondary | ICD-10-CM

## 2015-04-18 DIAGNOSIS — F0390 Unspecified dementia without behavioral disturbance: Secondary | ICD-10-CM | POA: Diagnosis present

## 2015-04-18 DIAGNOSIS — M3500 Sicca syndrome, unspecified: Secondary | ICD-10-CM | POA: Diagnosis present

## 2015-04-18 DIAGNOSIS — B9620 Unspecified Escherichia coli [E. coli] as the cause of diseases classified elsewhere: Secondary | ICD-10-CM | POA: Diagnosis not present

## 2015-04-18 DIAGNOSIS — M797 Fibromyalgia: Secondary | ICD-10-CM | POA: Diagnosis present

## 2015-04-18 DIAGNOSIS — Z800 Family history of malignant neoplasm of digestive organs: Secondary | ICD-10-CM

## 2015-04-18 DIAGNOSIS — K219 Gastro-esophageal reflux disease without esophagitis: Secondary | ICD-10-CM | POA: Diagnosis present

## 2015-04-18 DIAGNOSIS — F329 Major depressive disorder, single episode, unspecified: Secondary | ICD-10-CM | POA: Diagnosis not present

## 2015-04-18 DIAGNOSIS — R001 Bradycardia, unspecified: Secondary | ICD-10-CM | POA: Diagnosis not present

## 2015-04-18 DIAGNOSIS — N390 Urinary tract infection, site not specified: Secondary | ICD-10-CM | POA: Diagnosis not present

## 2015-04-18 DIAGNOSIS — G629 Polyneuropathy, unspecified: Secondary | ICD-10-CM | POA: Diagnosis not present

## 2015-04-18 DIAGNOSIS — E861 Hypovolemia: Secondary | ICD-10-CM | POA: Diagnosis present

## 2015-04-18 DIAGNOSIS — Z885 Allergy status to narcotic agent status: Secondary | ICD-10-CM

## 2015-04-18 DIAGNOSIS — Z808 Family history of malignant neoplasm of other organs or systems: Secondary | ICD-10-CM

## 2015-04-18 DIAGNOSIS — R2681 Unsteadiness on feet: Secondary | ICD-10-CM | POA: Diagnosis not present

## 2015-04-18 DIAGNOSIS — E785 Hyperlipidemia, unspecified: Secondary | ICD-10-CM | POA: Diagnosis present

## 2015-04-18 DIAGNOSIS — R410 Disorientation, unspecified: Secondary | ICD-10-CM | POA: Diagnosis not present

## 2015-04-18 DIAGNOSIS — Z87891 Personal history of nicotine dependence: Secondary | ICD-10-CM | POA: Diagnosis not present

## 2015-04-18 DIAGNOSIS — I341 Nonrheumatic mitral (valve) prolapse: Secondary | ICD-10-CM | POA: Diagnosis present

## 2015-04-18 DIAGNOSIS — N281 Cyst of kidney, acquired: Secondary | ICD-10-CM | POA: Diagnosis present

## 2015-04-18 DIAGNOSIS — R296 Repeated falls: Secondary | ICD-10-CM | POA: Diagnosis not present

## 2015-04-18 DIAGNOSIS — I4510 Unspecified right bundle-branch block: Secondary | ICD-10-CM | POA: Diagnosis present

## 2015-04-18 DIAGNOSIS — R404 Transient alteration of awareness: Secondary | ICD-10-CM | POA: Diagnosis not present

## 2015-04-18 DIAGNOSIS — K589 Irritable bowel syndrome without diarrhea: Secondary | ICD-10-CM | POA: Diagnosis present

## 2015-04-18 DIAGNOSIS — Z8249 Family history of ischemic heart disease and other diseases of the circulatory system: Secondary | ICD-10-CM

## 2015-04-18 DIAGNOSIS — Z880 Allergy status to penicillin: Secondary | ICD-10-CM

## 2015-04-18 DIAGNOSIS — N289 Disorder of kidney and ureter, unspecified: Secondary | ICD-10-CM | POA: Diagnosis not present

## 2015-04-18 DIAGNOSIS — G8929 Other chronic pain: Secondary | ICD-10-CM | POA: Diagnosis present

## 2015-04-18 DIAGNOSIS — G9340 Encephalopathy, unspecified: Principal | ICD-10-CM | POA: Diagnosis present

## 2015-04-18 DIAGNOSIS — Z79899 Other long term (current) drug therapy: Secondary | ICD-10-CM

## 2015-04-18 DIAGNOSIS — Z91018 Allergy to other foods: Secondary | ICD-10-CM | POA: Diagnosis not present

## 2015-04-18 DIAGNOSIS — Z5189 Encounter for other specified aftercare: Secondary | ICD-10-CM | POA: Diagnosis not present

## 2015-04-18 DIAGNOSIS — I480 Paroxysmal atrial fibrillation: Secondary | ICD-10-CM | POA: Diagnosis present

## 2015-04-18 DIAGNOSIS — Z886 Allergy status to analgesic agent status: Secondary | ICD-10-CM | POA: Diagnosis not present

## 2015-04-18 DIAGNOSIS — E7800 Pure hypercholesterolemia, unspecified: Secondary | ICD-10-CM | POA: Diagnosis present

## 2015-04-18 DIAGNOSIS — R1311 Dysphagia, oral phase: Secondary | ICD-10-CM | POA: Diagnosis not present

## 2015-04-18 DIAGNOSIS — Z9049 Acquired absence of other specified parts of digestive tract: Secondary | ICD-10-CM

## 2015-04-18 DIAGNOSIS — M6281 Muscle weakness (generalized): Secondary | ICD-10-CM | POA: Diagnosis not present

## 2015-04-18 LAB — CBC WITH DIFFERENTIAL/PLATELET
BASOS ABS: 0 10*3/uL (ref 0.0–0.1)
Basophils Relative: 0 %
Eosinophils Absolute: 0.1 10*3/uL (ref 0.0–0.7)
Eosinophils Relative: 1 %
HEMATOCRIT: 42.4 % (ref 36.0–46.0)
Hemoglobin: 13.6 g/dL (ref 12.0–15.0)
LYMPHS ABS: 1.2 10*3/uL (ref 0.7–4.0)
LYMPHS PCT: 16 %
MCH: 29.8 pg (ref 26.0–34.0)
MCHC: 32.1 g/dL (ref 30.0–36.0)
MCV: 92.8 fL (ref 78.0–100.0)
MONO ABS: 0.7 10*3/uL (ref 0.1–1.0)
Monocytes Relative: 9 %
NEUTROS ABS: 5.6 10*3/uL (ref 1.7–7.7)
Neutrophils Relative %: 74 %
Platelets: 205 10*3/uL (ref 150–400)
RBC: 4.57 MIL/uL (ref 3.87–5.11)
RDW: 13.9 % (ref 11.5–15.5)
WBC: 7.5 10*3/uL (ref 4.0–10.5)

## 2015-04-18 LAB — COMPREHENSIVE METABOLIC PANEL
ALBUMIN: 4.3 g/dL (ref 3.5–5.0)
ALT: 19 U/L (ref 14–54)
ANION GAP: 9 (ref 5–15)
AST: 24 U/L (ref 15–41)
Alkaline Phosphatase: 93 U/L (ref 38–126)
BILIRUBIN TOTAL: 0.6 mg/dL (ref 0.3–1.2)
BUN: 18 mg/dL (ref 6–20)
CHLORIDE: 103 mmol/L (ref 101–111)
CO2: 28 mmol/L (ref 22–32)
Calcium: 10 mg/dL (ref 8.9–10.3)
Creatinine, Ser: 0.68 mg/dL (ref 0.44–1.00)
GFR calc Af Amer: >60 mL/min (ref >60)
GFR calc non Af Amer: >60 mL/min (ref >60)
GLUCOSE: 134 mg/dL — AB (ref 65–99)
POTASSIUM: 4.1 mmol/L (ref 3.5–5.1)
Sodium: 140 mmol/L (ref 135–145)
TOTAL PROTEIN: 7.7 g/dL (ref 6.5–8.1)

## 2015-04-18 LAB — URINE MICROSCOPIC-ADD ON
RBC / HPF: NONE SEEN RBC/hpf (ref 0–5)
Squamous Epithelial / LPF: NONE SEEN

## 2015-04-18 LAB — URINALYSIS, ROUTINE W REFLEX MICROSCOPIC
BILIRUBIN URINE: NEGATIVE
GLUCOSE, UA: NEGATIVE mg/dL
HGB URINE DIPSTICK: NEGATIVE
KETONES UR: NEGATIVE mg/dL
NITRITE: POSITIVE — AB
PROTEIN: NEGATIVE mg/dL
SPECIFIC GRAVITY, URINE: 1.014 (ref 1.005–1.030)
pH: 5.5 (ref 5.0–8.0)

## 2015-04-18 LAB — I-STAT TROPONIN, ED: Troponin i, poc: 0 ng/mL (ref 0.00–0.08)

## 2015-04-18 LAB — HEPATIC FUNCTION PANEL: Bilirubin, Total: 0.6 mg/dL

## 2015-04-18 MED ORDER — ASPIRIN 325 MG PO TABS
325.0000 mg | ORAL_TABLET | Freq: Every day | ORAL | Status: DC
Start: 2015-04-18 — End: 2015-04-21
  Administered 2015-04-18 – 2015-04-20 (×3): 325 mg via ORAL
  Filled 2015-04-18 (×3): qty 1

## 2015-04-18 MED ORDER — PANTOPRAZOLE SODIUM 40 MG PO TBEC
40.0000 mg | DELAYED_RELEASE_TABLET | Freq: Every day | ORAL | Status: DC
  Administered 2015-04-18 – 2015-04-20 (×3): 40 mg via ORAL
  Filled 2015-04-18 (×3): qty 1

## 2015-04-18 MED ORDER — ACETAMINOPHEN 650 MG RE SUPP: 650.0000 mg | Freq: Four times a day (QID) | RECTAL | Status: DC | PRN

## 2015-04-18 MED ORDER — METOPROLOL TARTRATE 50 MG PO TABS
50.0000 mg | ORAL_TABLET | Freq: Two times a day (BID) | ORAL | Status: DC
  Administered 2015-04-18 – 2015-04-20 (×4): 50 mg via ORAL
  Filled 2015-04-18 (×4): qty 1

## 2015-04-18 MED ORDER — SODIUM CHLORIDE 0.9 % IV SOLN
INTRAVENOUS | Status: AC
  Administered 2015-04-18: 22:00:00 via INTRAVENOUS
  Filled 2015-04-18: qty 1000

## 2015-04-18 MED ORDER — ONDANSETRON HCL 4 MG/2ML IJ SOLN: 4.0000 mg | Freq: Four times a day (QID) | INTRAMUSCULAR | Status: DC | PRN

## 2015-04-18 MED ORDER — POLYVINYL ALCOHOL 1.4 % OP SOLN
1.0000 [drp] | Freq: Two times a day (BID) | OPHTHALMIC | Status: DC | PRN
Start: 2015-04-18 — End: 2015-04-21
  Filled 2015-04-18: qty 15

## 2015-04-18 MED ORDER — ONDANSETRON HCL 4 MG PO TABS
4.0000 mg | ORAL_TABLET | Freq: Four times a day (QID) | ORAL | Status: DC | PRN
Start: 2015-04-18 — End: 2015-04-21

## 2015-04-18 MED ORDER — PILOCARPINE HCL 5 MG PO TABS
5.0000 mg | ORAL_TABLET | Freq: Two times a day (BID) | ORAL | Status: DC
  Administered 2015-04-18 – 2015-04-20 (×4): 5 mg via ORAL
  Filled 2015-04-18 (×5): qty 1

## 2015-04-18 MED ORDER — CEFTRIAXONE SODIUM 1 G IJ SOLR
1.0000 g | INTRAMUSCULAR | Status: DC
  Administered 2015-04-19 – 2015-04-20 (×2): 1 g via INTRAVENOUS
  Filled 2015-04-18 (×3): qty 10

## 2015-04-18 MED ORDER — DEXTROSE 5 % IV SOLN
1.0000 g | Freq: Once | INTRAVENOUS | Status: AC
  Administered 2015-04-18: 1 g via INTRAVENOUS
  Filled 2015-04-18: qty 10

## 2015-04-18 MED ORDER — ALPRAZOLAM 0.5 MG PO TABS
0.5000 mg | ORAL_TABLET | Freq: Two times a day (BID) | ORAL | Status: DC | PRN
  Administered 2015-04-19: 0.5 mg via ORAL
  Filled 2015-04-18: qty 1

## 2015-04-18 MED ORDER — ACETAMINOPHEN 325 MG PO TABS
650.0000 mg | ORAL_TABLET | Freq: Four times a day (QID) | ORAL | Status: DC | PRN
  Administered 2015-04-18 – 2015-04-20 (×3): 650 mg via ORAL
  Filled 2015-04-18 (×3): qty 2

## 2015-04-18 MED ORDER — ENOXAPARIN SODIUM 40 MG/0.4ML ~~LOC~~ SOLN
40.0000 mg | Freq: Every day | SUBCUTANEOUS | Status: DC
  Administered 2015-04-18 – 2015-04-19 (×2): 40 mg via SUBCUTANEOUS
  Filled 2015-04-18 (×2): qty 0.4

## 2015-04-18 MED ORDER — DULOXETINE HCL 60 MG PO CPEP
60.0000 mg | ORAL_CAPSULE | Freq: Every day | ORAL | Status: DC
  Administered 2015-04-18 – 2015-04-20 (×3): 60 mg via ORAL
  Filled 2015-04-18 (×3): qty 1

## 2015-04-18 MED ORDER — SALINE SPRAY 0.65 % NA SOLN
1.0000 | NASAL | Status: DC | PRN
  Filled 2015-04-18: qty 44

## 2015-04-18 MED ORDER — MIRABEGRON ER 25 MG PO TB24
25.0000 mg | ORAL_TABLET | Freq: Every day | ORAL | Status: DC
  Administered 2015-04-18 – 2015-04-20 (×3): 25 mg via ORAL
  Filled 2015-04-18 (×3): qty 1

## 2015-04-18 MED ORDER — PRAVASTATIN SODIUM 20 MG PO TABS
20.0000 mg | ORAL_TABLET | Freq: Every day | ORAL | Status: DC
  Administered 2015-04-19 – 2015-04-20 (×2): 20 mg via ORAL
  Filled 2015-04-18 (×2): qty 1

## 2015-04-18 MED ORDER — MEMANTINE HCL 10 MG PO TABS
10.0000 mg | ORAL_TABLET | Freq: Two times a day (BID) | ORAL | Status: DC
  Administered 2015-04-18 – 2015-04-20 (×4): 10 mg via ORAL
  Filled 2015-04-18 (×5): qty 1

## 2015-04-18 MED ORDER — IOHEXOL 300 MG/ML  SOLN
100.0000 mL | Freq: Once | INTRAMUSCULAR | Status: AC | PRN
  Administered 2015-04-18: 100 mL via INTRAVENOUS

## 2015-04-18 MED ORDER — BOOST PLUS PO LIQD
237.0000 mL | Freq: Two times a day (BID) | ORAL | Status: DC
  Administered 2015-04-19 – 2015-04-20 (×3): 237 mL via ORAL
  Filled 2015-04-18 (×4): qty 237

## 2015-04-18 MED ORDER — PREGABALIN 75 MG PO CAPS
75.0000 mg | ORAL_CAPSULE | Freq: Two times a day (BID) | ORAL | Status: DC
  Administered 2015-04-18 – 2015-04-20 (×4): 75 mg via ORAL
  Filled 2015-04-18 (×4): qty 1

## 2015-04-18 MED ORDER — LUBIPROSTONE 24 MCG PO CAPS
24.0000 ug | ORAL_CAPSULE | Freq: Two times a day (BID) | ORAL | Status: DC
  Administered 2015-04-19 – 2015-04-20 (×4): 24 ug via ORAL
  Filled 2015-04-18 (×5): qty 1

## 2015-04-18 MED ORDER — IOHEXOL 300 MG/ML  SOLN
50.0000 mL | Freq: Once | INTRAMUSCULAR | Status: DC | PRN
Start: 2015-04-18 — End: 2015-04-21
  Administered 2015-04-18: 50 mL via ORAL
  Filled 2015-04-18: qty 50

## 2015-04-18 MED ORDER — HYDROXYCHLOROQUINE SULFATE 200 MG PO TABS
200.0000 mg | ORAL_TABLET | ORAL | Status: DC
  Administered 2015-04-19 – 2015-04-20 (×3): 200 mg via ORAL
  Filled 2015-04-18 (×5): qty 1

## 2015-04-18 NOTE — ED Notes (Signed)
Please call patients daughter, Delinda Benvenuto, [phone] when assigned room.

## 2015-04-18 NOTE — ED Notes (Signed)
Attempted to call report, floor is not answering.

## 2015-04-18 NOTE — ED Notes (Signed)
Patient transported to CT 

## 2015-04-18 NOTE — ED Notes (Addendum)
Patient transported by [facility] from home.  Pt c.o increased weakness x 2days, unable to walk due to weakness.  Pt has hx of multiple UTI's and states she feels like she has one today.  Pt also c/o chronic lower back pain radiating to left leg.  Pt denies N/V, fever, and other complaints.  Pt states she has minimal PO intake recently.  Patient lives alone and is cared for in home by nursing aides.

## 2015-04-18 NOTE — ED Provider Notes (Signed)
CSN: [id number]     Arrival date & time 04/18/15  1339 History   First MD Initiated Contact with Patient 04/18/15 1501     Chief Complaint  Patient presents with  . Weakness    HPI  Ariana [name] is an 82 y.o. female with history of MVP, HTN, Sjogren's, peripheral neuropathy, DDD, chronic low back pain who presents to the ED for evaluation of weakness. Pt states she has been feeling weak for the past week or so. She states that this morning she was trying to get out of bed when her chronic back pain was worse than usual, radiating down her left leg. She states that she was able to get over to the side of the bed and was trying to step onto her step stool but her legs felt too weak so she slid down to the stool. States that she was not able to get back up on her own. She denies feeling faint, dizzy, or LOC. She denies chest pain or SOB. Denies urinary symptoms, n/v/d. She does think the right side of her abdomen is more distended than usual. Denies abdominal pain.   She is accompanied by her daughter who adds to the history. Pt's daughter reports that pt has seemed a bit more out of it over the past week. States pt has been slower in her speech and slow to respond, with some mild confusion increased from baseline. Pt reportedly lives at home alone but has daytime caregivers and has home PT/OT. Her caregivers and therapists have also apparently noticed the slowed cognition and weakness this week. Pt's daughter states that at baseline pt is ambulatory with a walker, but today is unable to ambulate because she feels so weak. In the ED pt is low-energy though awake and appropriate. Her speech is slow and answers at time delayed. She is a&o x 4.  Past Medical History  Diagnosis Date  . Sjogren's syndrome ([place])   . Dry eye syndrome   . Hypertension, benign   . Mitral valve prolapse   . GERD (gastroesophageal reflux disease)   . Diverticulosis of colon   . Irritable bowel syndrome   . Urinary incontinence    . Low back pain syndrome   . Fibromyalgia   . Memory loss   . Anxiety and depression   . History of adverse drug reaction   . Peripheral neuropathy ([facility])     "both feet and legs"  . Shortness of breath 07/18/11    "alot lately"  . Anginal pain ([place])   . History of recurrent UTIs   . H/O hiatal hernia   . Anxiety   . Dementia   . Depression   . Abnormality of gait   . Thyroid nodule   . Headache(784.0) 09/05/2012  . Adenomatous polyp of colon 2002    70mm  . History of cerebrovascular disease 09/24/2014   Past Surgical History  Procedure Laterality Date  . Vesicovaginal fistula closure w/ tah    . Appendectomy    . Cholecystectomy  2000  . Mandible surgery    . Temporomandibular joint surgery  1986    Dr. Terence Lux  . Cataract extraction, bilateral    . Abdominal hysterectomy  1967  . Dental surgery      multiple tooth extractions  . Esophagogastroduodenoscopy (egd) with esophageal dilation N/A 08/23/2012    Procedure: ESOPHAGOGASTRODUODENOSCOPY (EGD) WITH ESOPHAGEAL DILATION;  Surgeon: Milus Banister, MD;  Location: [facility];  Service: Endoscopy;  Laterality: N/A;  .  Colonoscopy w/ biopsies      multiple  . Nasal septum surgery  1980  . Transthoracic echocardiogram  2001    mild LVH, normal LV  . Nm myocar perf wall motion  2003    persantine - normal static and dynamic study w/apical thinning and presvered LV function, no ischemia  . Cardiac catheterization  02/17/2003    normal L main, LAD free of disease, Cfx free of disease, RCA free of disease (Dr. Rockne Menghini)   Family History  Problem Relation Age of Onset  . Heart disease Father     heart attack  . Pneumonia Mother   . Heart attack Mother   . Hypertension Mother   . Hypertension Maternal Grandmother   . Colon cancer Sister   . Kidney disease Daughter   . Asthma Daughter   . Arthritis Daughter 81    osteo,  . Heart disease Son 72    stage 3 CHF(Diastolic /Systolic)  . Throat cancer Brother    Social  History  Substance Use Topics  . Smoking status: Former Research scientist (life sciences)  . Smokeless tobacco: Never Used     Comment: Quit at age 48   . Alcohol Use: No   OB History    No data available     Review of Systems  All other systems reviewed and are negative.     Allergies  Banana; Codeine; Klonopin; Meperidine hcl; Norflex; Oxycodone-acetaminophen; Propoxyphene hcl; Zoloft; Doxycycline; Naproxen; Penicillins; Phenothiazines; Stelazine; Sulfamethoxazole-trimethoprim; Tolectin; and Tramadol  Home Medications   Prior to Admission medications   Medication Sig Start Date End Date Taking? Authorizing Provider  ALPRAZolam Duanne Moron) 0.5 MG tablet Take 1 tablet (0.5 mg total) by mouth 2 (two) times daily as needed for anxiety. 01/21/15   Tiffany L Reed, DO  aspirin 325 MG tablet Take 1 tablet (325 mg total) by mouth daily. 07/28/14   Reyne Dumas, MD  bisacodyl (BISACODYL) 5 MG EC tablet Take 2 tablets (10 mg total) by mouth daily as needed for moderate constipation. If no effective bowel movement every 2- 3 days Patient taking differently: Take 5-10 mg by mouth daily as needed for moderate constipation. If no effective bowel movement every 2- 3 days 07/08/14   Gatha Mayer, MD  CALCIUM CARBONATE PO Take 1 tablet by mouth daily.    Historical Provider, MD  carboxymethylcellulose (REFRESH TEARS) 0.5 % SOLN Place 1 drop into both eyes 2 (two) times daily as needed (dry eyes).     Historical Provider, MD  cetirizine (ZYRTEC) 10 MG tablet Take 1 tablet (10 mg total) by mouth daily. 03/23/14   Tiffany L Reed, DO  CRANBERRY SOFT PO Take 1 capsule by mouth daily.    Historical Provider, MD  diltiazem (CARDIZEM CD) 180 MG 24 hr capsule Take 1 capsule (180 mg total) by mouth daily. Please schedule appointment for refills. 04/09/15   Pixie Casino, MD  DULoxetine (CYMBALTA) 30 MG capsule Take one tablet  once daily in the morning for depression 04/07/15   Kathrynn Ducking, MD  DULoxetine (CYMBALTA) 60 MG capsule Take  one tablet by mouth in the evening once daily for depression 04/07/15   Kathrynn Ducking, MD  HYDROcodone-acetaminophen (NORCO/VICODIN) 5-325 MG tablet Take 1 tablet by mouth every 6 (six) hours as needed for moderate pain. Take one tablet twice daily for pain 01/29/15   Gildardo Cranker, DO  hydroxychloroquine (PLAQUENIL) 200 MG tablet Take 200 mg by mouth. Take one tablet twice daily Monday thru Friday,  only    Historical Provider, MD  lactose free nutrition (BOOST PLUS) LIQD Take 237 mLs by mouth 2 (two) times daily between meals. 07/22/13   Maryann Mikhail, DO  lansoprazole (PREVACID) 30 MG capsule TAKE ONE CAPSULE BY MOUTH EVERY DAY AT 12 NOON 02/18/15   Tiffany L Reed, DO  lubiprostone (AMITIZA) 24 MCG capsule Take 1 capsule (24 mcg total) by mouth 2 (two) times daily with a meal. 07/08/14   Gatha Mayer, MD  LYRICA 75 MG capsule TAKE ONE CAPSULE BY MOUTH TWICE A DAY 02/05/15   Tiffany L Reed, DO  memantine (NAMENDA) 10 MG tablet Take 1 tablet (10 mg total) by mouth 2 (two) times daily. 04/07/15   Kathrynn Ducking, MD  metoprolol (LOPRESSOR) 50 MG tablet Take 1 tablet (50 mg total) by mouth 2 (two) times daily. 01/04/15   Pixie Casino, MD  mirabegron ER (MYRBETRIQ) 25 MG TB24 tablet Take 1 tablet (25 mg total) by mouth daily. 01/04/15   Tiffany L Reed, DO  nystatin (MYCOSTATIN) 100000 UNIT/ML suspension TAKE 1 TEASPOONFUL BY MOUTH 4 TIMES A DAY as needed for mouth pain Patient taking differently: Take 5 mLs by mouth 4 (four) times daily as needed (for mouth pain).  09/04/14   Tiffany L Reed, DO  pilocarpine (SALAGEN) 5 MG tablet Take 5 mg by mouth 2 (two) times daily.    Historical Provider, MD  pravastatin (PRAVACHOL) 20 MG tablet Take 1 tablet (20 mg total) by mouth daily at 6 PM. 01/01/15   Tiffany L Reed, DO  sodium chloride (OCEAN) 0.65 % SOLN nasal spray Place 1 spray into the nose as needed for congestion. 08/23/12   Modena Jansky, MD  triamcinolone ointment (KENALOG) 0.1 % Apply 1  application topically 2 (two) times daily as needed (rash).  04/01/14   Historical Provider, MD   BP 137/65 mmHg  Pulse 65  Temp(Src) 98.4 F (36.9 C) (Oral)  Resp 18  SpO2 98% Physical Exam  Constitutional: She is oriented to person, place, and time.  Appears fatigued  HENT:  Right Ear: External ear normal.  Left Ear: External ear normal.  Nose: Nose normal.  Mouth/Throat: Oropharynx is clear and moist. No oropharyngeal exudate.  Eyes: Conjunctivae and EOM are normal. Pupils are equal, round, and reactive to light.  Neck: Normal range of motion. Neck supple.  Cardiovascular: Normal rate, regular rhythm, normal heart sounds and intact distal pulses.   Pulmonary/Chest: Effort normal and breath sounds normal. No respiratory distress. She has no wheezes. She exhibits no tenderness.  Abdominal: Soft. Bowel sounds are normal. She exhibits distension. There is tenderness. There is no CVA tenderness.  There is abdominal distension, right side worse than left. Pt is diffusely tender with some mild involuntary guarding. No rebound. Abdomen soft.  Musculoskeletal: She exhibits no edema.  Neurological: She is alert and oriented to person, place, and time. No cranial nerve deficit.  Skin: Skin is warm and dry.  Psychiatric: She has a normal mood and affect.  Nursing note and vitals reviewed.  Filed Vitals:   04/18/15 1347 04/18/15 1350 04/18/15 1625  BP: 137/65  123/97  Pulse: 65  54  Temp: 98.4 F (36.9 C)  97.7 F (36.5 C)  TempSrc: Oral  Oral  Resp: 18  16  SpO2: 99% 98% 98%     ED Course  Procedures (including critical care time) Labs Review Labs Reviewed  COMPREHENSIVE METABOLIC PANEL - Abnormal; Notable for the following:    Glucose,  Bld 134 (*)    All other components within normal limits  URINALYSIS, ROUTINE W REFLEX MICROSCOPIC (NOT AT [facility]) - Abnormal; Notable for the following:    APPearance CLOUDY (*)    Nitrite POSITIVE (*)    Leukocytes, UA SMALL (*)    All other  components within normal limits  URINE MICROSCOPIC-ADD ON - Abnormal; Notable for the following:    Bacteria, UA MANY (*)    All other components within normal limits  URINE CULTURE  CBC WITH DIFFERENTIAL/PLATELET  OCCULT BLOOD X 1 CARD TO LAB, STOOL  I-STAT TROPOININ, ED    Imaging Review Dg Chest 2 View  04/18/2015  CLINICAL DATA:  Weakness since yesterday.  Hypertension. EXAM: CHEST  2 VIEW COMPARISON:  03/01/2015 FINDINGS: Mild cardiomegaly is stable. Mild scarring in right lung base is stable. No evidence of pulmonary edema or consolidation. No evidence pleural effusion or pneumothorax. IMPRESSION: Mild cardiomegaly.  No active lung disease. Electronically Signed   By: Earle Gell M.D.   On: 04/18/2015 15:40   Ct Abdomen Pelvis W Contrast  04/18/2015  CLINICAL DATA:  Weakness for 2 days. EXAM: CT ABDOMEN AND PELVIS WITH CONTRAST TECHNIQUE: Multidetector CT imaging of the abdomen and pelvis was performed using the standard protocol following bolus administration of intravenous contrast. CONTRAST:  90mL OMNIPAQUE IOHEXOL 300 MG/ML SOLN, 168mL OMNIPAQUE IOHEXOL 300 MG/ML SOLN COMPARISON:  12/07/2014 FINDINGS: Lower chest:  No acute findings. Hepatobiliary: No focal liver abnormality. Previous cholecystectomy. Fusiform dilatation of the common bile duct measures up to 8 mm. Pancreas: No mass, inflammatory changes, or other significant abnormality. Spleen: Within normal limits in size and appearance. Adrenals/Urinary Tract: Intermediate attenuating cyst arising from the mid left kidney has increased in size from previous exam and now measures 1.7 cm and 29 HU. On the previous exam this structure measured 0.8 cm. No obstructive uropathy. The urinary bladder appears within normal limits. Stomach/Bowel: The stomach is normal. The small bowel loops have at normal course and caliber. No obstruction. No pathologic dilatation of the colon. Vascular/Lymphatic: No pathologically enlarged lymph nodes. No evidence  of abdominal aortic aneurysm. Reproductive: No mass or other significant abnormality. Other: No free fluid or fluid collections. Small periumbilical hernia contains fat only. Musculoskeletal: No aggressive lytic or sclerotic bone lesions. Degenerative disc disease is noted at the L5-S1 level. IMPRESSION: 1. No acute findings identified within the abdomen or pelvis. No explanation for patient's weakness. 2. Increase in size of intermediate attenuating lesion within the left kidney. Nonspecific. Recommend further investigation with nonemergent renal mass protocol MRI or CT. 3. Increase caliber of the CBD status post cholecystectomy. 4. Lumbar degenerative disc disease. Electronically Signed   By: Kerby Moors M.D.   On: 04/18/2015 17:03   I have personally reviewed and evaluated these images and lab results as part of my medical decision-making.   EKG Interpretation   Date/Time:  Sunday April 18 2015 15:30:51 EST Ventricular Rate:  57 PR Interval:  68 QRS Duration: 122 QT Interval:  504 QTC Calculation: 491 R Axis:   -44 Text Interpretation:  Sinus rhythm Short PR interval RBBB and LAFB No  significant change since last tracing Confirmed by FLOYD MD, Quillian Quince  [id number]) on 04/18/2015 3:38:11 PM      MDM   Final diagnoses:  UTI (lower urinary tract infection)  Weakness    UA is nitrite positive, with small leuks and many bacteria. No blood. Workup is otherwise unrevealing. I suspect UTI is the cause of pt's symptoms  and mild cognitive slowing. No white count, afebrile. Pressures stable and normal. Her labs otherwise unremarkable, CT shows renal cyst enlarged from prior but no emergent/acute pathology. I discussed findings with pt and her daughter. Given pt's degree of weakness she does not feel she can go home. I think it would be reasonable to admit pt for UTI. Will call hospitalist for admission.  Spoke to Dr. Carles Collet who will come see and admit patient.  Anne Ng, PA-C 04/18/15  [place], DO 04/18/15 [phone]

## 2015-04-18 NOTE — ED Notes (Signed)
Bed: [id number] Expected date:  Expected time:  Means of arrival:  Comments: AMS ? UTI

## 2015-04-18 NOTE — H&P (Signed)
History and Physical  Ariana [name] [id number] DOB: 04-Jun-1929 DOA: 04/18/2015   PCP: Hollace Kinnier, DO  Referring Physician: ED/ Delrae Rend, PA-C  Chief Complaint: confusion  HPI:  82 year old female with a history of Sjogren syndrome, dementia, peripheral neuropathy, hypertension, depression, paroxysmal atrial fibrillation presented with one-week history of increasing generalized weakness and confusion. Unfortunately, the patient is unable to provide any significant history secondary to dementia and present encephalopathy. The patient's daughter is at the bedside to supplement the history. Apparently, the patient's caregivers have noted that the patient has had increasing confusion and generalized weakness over the past week. Apparently, the patient was trying to get out of bed today but had difficulty and slid onto her stool and was unable to get up from her stool. Apparently, the patient called her daughter this morning while sitting on her stool, and the patient appeared to be confused and told her daughter she was unable to get up off the stool. When the patient's family checked up on her, she was too weak to get up, but the patient was awake and alert although confused. As a result, EMS was activated. There has been no recent medication changes. No reports of headaches, chest pain, shortness breath, vomiting, diarrhea, fevers, chills.  In the emergency department, the patient was afebrile hemodynamically stable without tachycardia. Oxygen saturation was 98% on room air. BMP and CBC were unremarkable. Hepatic enzymes were unremarkable. Urinalysis showed 6-30 WBC. CT of the abdomen and pelvis showed increasing size of left renal cyst without any new acute findings. EKG shows sinus rhythm with right bundle branch block Assessment/Plan: Acute encephalopathy -Likely due to dehydration (patient is hemoconcentrated) and possible UTI -Continue ceftriaxone -Start intravenous  fluids -TSH -Serum B12 -CT brain Pyuria -Continue ceftriaxone pending culture data Dehydration -Although the patient does have Sjogren's syndrome, clinically the patient appears hypovolemic -IV fluids Dementia -Continue Namenda -The patient has had a cognitive and functional decline over this past year according to the patient's daughter Paroxysmal atrial fibrillation -Presently in sinus rhythm -Not a candidate for anticoagulation secondary to falls -Continue aspirin -Continue metoprolol tartrate -Hold Cardizem CD today and reevaluate blood pressure for restarting on 04/19/2015 Peripheral neuropathy -Continue Lyrica Hyperlipidemia -Continue pravastatin Chronic back pain -Holld opioids for now in the setting of confusion       Past Medical History  Diagnosis Date  . Sjogren's syndrome ([place])   . Dry eye syndrome   . Hypertension, benign   . Mitral valve prolapse   . GERD (gastroesophageal reflux disease)   . Diverticulosis of colon   . Irritable bowel syndrome   . Urinary incontinence   . Low back pain syndrome   . Fibromyalgia   . Memory loss   . Anxiety and depression   . History of adverse drug reaction   . Peripheral neuropathy ([facility])     "both feet and legs"  . Shortness of breath 07/18/11    "alot lately"  . Anginal pain ([place])   . History of recurrent UTIs   . H/O hiatal hernia   . Anxiety   . Dementia   . Depression   . Abnormality of gait   . Thyroid nodule   . Headache(784.0) 09/05/2012  . Adenomatous polyp of colon 2002    25mm  . History of cerebrovascular disease 09/24/2014   Past Surgical History  Procedure Laterality Date  . Vesicovaginal fistula closure w/ tah    . Appendectomy    . Cholecystectomy  2000  . Mandible surgery    . Temporomandibular joint surgery  1986    Dr. Terence Lux  . Cataract extraction, bilateral    . Abdominal hysterectomy  1967  . Dental surgery      multiple tooth extractions  . Esophagogastroduodenoscopy (egd)  with esophageal dilation N/A 08/23/2012    Procedure: ESOPHAGOGASTRODUODENOSCOPY (EGD) WITH ESOPHAGEAL DILATION;  Surgeon: Milus Banister, MD;  Location: [facility];  Service: Endoscopy;  Laterality: N/A;  . Colonoscopy w/ biopsies      multiple  . Nasal septum surgery  1980  . Transthoracic echocardiogram  2001    mild LVH, normal LV  . Nm myocar perf wall motion  2003    persantine - normal static and dynamic study w/apical thinning and presvered LV function, no ischemia  . Cardiac catheterization  02/17/2003    normal L main, LAD free of disease, Cfx free of disease, RCA free of disease (Dr. Rockne Menghini)   Social History:  reports that she has quit smoking. She has never used smokeless tobacco. She reports that she does not drink alcohol or use illicit drugs.   Family History  Problem Relation Age of Onset  . Heart disease Father     heart attack  . Pneumonia Mother   . Heart attack Mother   . Hypertension Mother   . Hypertension Maternal Grandmother   . Colon cancer Sister   . Kidney disease Daughter   . Asthma Daughter   . Arthritis Daughter 85    osteo,  . Heart disease Son 28    stage 3 CHF(Diastolic /Systolic)  . Throat cancer Brother      Allergies  Allergen Reactions  . Banana Nausea And Vomiting  . Codeine Nausea Only    unless given with Phenergan  . Klonopin [Clonazepam] Other (See Comments)    Causes hallucination   . Meperidine Hcl Nausea Only    unless given with Phenergan  . Norflex [Orphenadrine Citrate] Nausea Only    Unless given with Phenergan  . Oxycodone-Acetaminophen Nausea Only    unless given with phenergan  . Propoxyphene Hcl Nausea Only    unless given with phenergan  . Zoloft [Sertraline Hcl] Other (See Comments)    Caused lethargy  . Doxycycline Other (See Comments)    Unknown  . Naproxen Other (See Comments)  . Penicillins Other (See Comments)    Unknown  . Phenothiazines Other (See Comments)    Unknown  . Stelazine Other (See  Comments)    Unknown  . Sulfamethoxazole-Trimethoprim Other (See Comments)    Unknown  . Tolectin [Tolmetin Sodium] Other (See Comments)    Unknown  . Tramadol Other (See Comments)    Unknown      Prior to Admission medications   Medication Sig Start Date End Date Taking? Authorizing Provider  ALPRAZolam Duanne Moron) 0.5 MG tablet Take 1 tablet (0.5 mg total) by mouth 2 (two) times daily as needed for anxiety. 01/21/15   Tiffany L Reed, DO  aspirin 325 MG tablet Take 1 tablet (325 mg total) by mouth daily. 07/28/14   Reyne Dumas, MD  bisacodyl (BISACODYL) 5 MG EC tablet Take 2 tablets (10 mg total) by mouth daily as needed for moderate constipation. If no effective bowel movement every 2- 3 days Patient taking differently: Take 5-10 mg by mouth daily as needed for moderate constipation. If no effective bowel movement every 2- 3 days 07/08/14   Gatha Mayer, MD  CALCIUM CARBONATE PO Take 1 tablet  by mouth daily.    Historical Provider, MD  carboxymethylcellulose (REFRESH TEARS) 0.5 % SOLN Place 1 drop into both eyes 2 (two) times daily as needed (dry eyes).     Historical Provider, MD  cetirizine (ZYRTEC) 10 MG tablet Take 1 tablet (10 mg total) by mouth daily. 03/23/14   Tiffany L Reed, DO  CRANBERRY SOFT PO Take 1 capsule by mouth daily.    Historical Provider, MD  diltiazem (CARDIZEM CD) 180 MG 24 hr capsule Take 1 capsule (180 mg total) by mouth daily. Please schedule appointment for refills. 04/09/15   Pixie Casino, MD  DULoxetine (CYMBALTA) 30 MG capsule Take one tablet  once daily in the morning for depression 04/07/15   Kathrynn Ducking, MD  DULoxetine (CYMBALTA) 60 MG capsule Take one tablet by mouth in the evening once daily for depression 04/07/15   Kathrynn Ducking, MD  HYDROcodone-acetaminophen (NORCO/VICODIN) 5-325 MG tablet Take 1 tablet by mouth every 6 (six) hours as needed for moderate pain. Take one tablet twice daily for pain 01/29/15   Gildardo Cranker, DO  hydroxychloroquine  (PLAQUENIL) 200 MG tablet Take 200 mg by mouth. Take one tablet twice daily Monday thru Friday, only    Historical Provider, MD  lactose free nutrition (BOOST PLUS) LIQD Take 237 mLs by mouth 2 (two) times daily between meals. 07/22/13   Maryann Mikhail, DO  lansoprazole (PREVACID) 30 MG capsule TAKE ONE CAPSULE BY MOUTH EVERY DAY AT 12 NOON 02/18/15   Tiffany L Reed, DO  lubiprostone (AMITIZA) 24 MCG capsule Take 1 capsule (24 mcg total) by mouth 2 (two) times daily with a meal. 07/08/14   Gatha Mayer, MD  LYRICA 75 MG capsule TAKE ONE CAPSULE BY MOUTH TWICE A DAY 02/05/15   Tiffany L Reed, DO  memantine (NAMENDA) 10 MG tablet Take 1 tablet (10 mg total) by mouth 2 (two) times daily. 04/07/15   Kathrynn Ducking, MD  metoprolol (LOPRESSOR) 50 MG tablet Take 1 tablet (50 mg total) by mouth 2 (two) times daily. 01/04/15   Pixie Casino, MD  mirabegron ER (MYRBETRIQ) 25 MG TB24 tablet Take 1 tablet (25 mg total) by mouth daily. 01/04/15   Tiffany L Reed, DO  nystatin (MYCOSTATIN) 100000 UNIT/ML suspension TAKE 1 TEASPOONFUL BY MOUTH 4 TIMES A DAY as needed for mouth pain Patient taking differently: Take 5 mLs by mouth 4 (four) times daily as needed (for mouth pain).  09/04/14   Tiffany L Reed, DO  pilocarpine (SALAGEN) 5 MG tablet Take 5 mg by mouth 2 (two) times daily.    Historical Provider, MD  pravastatin (PRAVACHOL) 20 MG tablet Take 1 tablet (20 mg total) by mouth daily at 6 PM. 01/01/15   Tiffany L Reed, DO  sodium chloride (OCEAN) 0.65 % SOLN nasal spray Place 1 spray into the nose as needed for congestion. 08/23/12   Modena Jansky, MD  triamcinolone ointment (KENALOG) 0.1 % Apply 1 application topically 2 (two) times daily as needed (rash).  04/01/14   Historical Provider, MD    Review of Systems:  Constitutional:  No weight loss, night sweats  Head&Eyes: No headache.  No vision loss.  No eye pain or scotoma ENT:  No Difficulty swallowing,Tooth/dental problems,Sore throat,    Cardio-vascular:  No chest pain, Orthopnea, PND, swelling in lower extremities,  dizziness, palpitations  GI:  No  abdominal pain, nausea, vomiting, diarrhea, loss of appetite, hematochezia, melena, Resp:  No shortness of breath with exertion or  at rest. No cough. No coughing up of blood . Skin:  no rash or lesions.  GU:  no dysuria, change in color of urine,No flank pain.  Musculoskeletal:  No joint pain or swelling. Psych:  No change in mood or affect.  Neurologic: No headache, no dysesthesia, no focal weakness, no vision loss.  Physical Exam: Filed Vitals:   04/18/15 1347 04/18/15 1350 04/18/15 1625  BP: 137/65  123/97  Pulse: 65  54  Temp: 98.4 F (36.9 C)  97.7 F (36.5 C)  TempSrc: Oral  Oral  Resp: 18  16  SpO2: 99% 98% 98%   General:  Awake and alert, NAD, nontoxic, pleasant/cooperative Head/Eye: No conjunctival hemorrhage, no icterus, Circle/AT, No nystagmus ENT:  No icterus,  No thrush, good dentition, no pharyngeal exudate Neck:  No masses, no lymphadenpathy, no bruits CV:  RRR, no rub, no gallop, no S3 Lung:  CTAB, good air movement, no wheeze, no rhonchi Abdomen: soft/NT, +BS, nondistended, no peritoneal signs;No hepatosplenomegaly Ext: No cyanosis, No rashes, No petechiae, No lymphangitis, trace LE edema Neuro: CNII-XII intact, strength 4/5 in bilateral upper and lower extremities, no dysmetria  Labs on Admission:  Basic Metabolic Panel:  Recent Labs Lab 04/18/15 1535  NA 140  K 4.1  CL 103  CO2 28  GLUCOSE 134*  BUN 18  CREATININE 0.68  CALCIUM 10.0   Liver Function Tests:  Recent Labs Lab 04/18/15 1535  AST 24  ALT 19  ALKPHOS 93  BILITOT 0.6  PROT 7.7  ALBUMIN 4.3   No results for input(s): LIPASE, AMYLASE in the last 168 hours. No results for input(s): AMMONIA in the last 168 hours. CBC:  Recent Labs Lab 04/18/15 1535  WBC 7.5  NEUTROABS 5.6  HGB 13.6  HCT 42.4  MCV 92.8  PLT 205   Cardiac Enzymes: No results for  input(s): CKTOTAL, CKMB, CKMBINDEX, TROPONINI in the last 168 hours. BNP: Invalid input(s): POCBNP CBG: No results for input(s): GLUCAP in the last 168 hours.  Radiological Exams on Admission: Dg Chest 2 View  04/18/2015  CLINICAL DATA:  Weakness since yesterday.  Hypertension. EXAM: CHEST  2 VIEW COMPARISON:  03/01/2015 FINDINGS: Mild cardiomegaly is stable. Mild scarring in right lung base is stable. No evidence of pulmonary edema or consolidation. No evidence pleural effusion or pneumothorax. IMPRESSION: Mild cardiomegaly.  No active lung disease. Electronically Signed   By: Earle Gell M.D.   On: 04/18/2015 15:40   Ct Abdomen Pelvis W Contrast  04/18/2015  CLINICAL DATA:  Weakness for 2 days. EXAM: CT ABDOMEN AND PELVIS WITH CONTRAST TECHNIQUE: Multidetector CT imaging of the abdomen and pelvis was performed using the standard protocol following bolus administration of intravenous contrast. CONTRAST:  4mL OMNIPAQUE IOHEXOL 300 MG/ML SOLN, 122mL OMNIPAQUE IOHEXOL 300 MG/ML SOLN COMPARISON:  12/07/2014 FINDINGS: Lower chest:  No acute findings. Hepatobiliary: No focal liver abnormality. Previous cholecystectomy. Fusiform dilatation of the common bile duct measures up to 8 mm. Pancreas: No mass, inflammatory changes, or other significant abnormality. Spleen: Within normal limits in size and appearance. Adrenals/Urinary Tract: Intermediate attenuating cyst arising from the mid left kidney has increased in size from previous exam and now measures 1.7 cm and 29 HU. On the previous exam this structure measured 0.8 cm. No obstructive uropathy. The urinary bladder appears within normal limits. Stomach/Bowel: The stomach is normal. The small bowel loops have at normal course and caliber. No obstruction. No pathologic dilatation of the colon. Vascular/Lymphatic: No pathologically enlarged lymph nodes. No  evidence of abdominal aortic aneurysm. Reproductive: No mass or other significant abnormality. Other: No free  fluid or fluid collections. Small periumbilical hernia contains fat only. Musculoskeletal: No aggressive lytic or sclerotic bone lesions. Degenerative disc disease is noted at the L5-S1 level. IMPRESSION: 1. No acute findings identified within the abdomen or pelvis. No explanation for patient's weakness. 2. Increase in size of intermediate attenuating lesion within the left kidney. Nonspecific. Recommend further investigation with nonemergent renal mass protocol MRI or CT. 3. Increase caliber of the CBD status post cholecystectomy. 4. Lumbar degenerative disc disease. Electronically Signed   By: Kerby Moors M.D.   On: 04/18/2015 17:03    EKG: Independently reviewed. Sinus, RBBB, nonspecific T wave changes    Time spent:60 minutes Code Status:   FULL Family Communication:   Daughter updated at bedside   Kaliyan Osbourn, DO  [facility] Pager [phone]  If 7PM-7AM, please contact night-coverage [email or web address] Password [facility] 04/18/2015, 6:33 PM

## 2015-04-19 ENCOUNTER — Encounter (HOSPITAL_COMMUNITY): Payer: Self-pay

## 2015-04-19 LAB — VITAMIN B12: VITAMIN B 12: 521 pg/mL (ref 180–914)

## 2015-04-19 LAB — TSH
TSH: 1.68 u[IU]/mL (ref 0.350–4.500)
TSH: 1.68 u[IU]/mL (ref 0.41–5.90)

## 2015-04-19 MED ORDER — DOCUSATE SODIUM 100 MG PO CAPS
100.0000 mg | ORAL_CAPSULE | Freq: Two times a day (BID) | ORAL | Status: DC
  Administered 2015-04-19 – 2015-04-20 (×3): 100 mg via ORAL
  Filled 2015-04-19 (×3): qty 1

## 2015-04-19 MED ORDER — SENNA 8.6 MG PO TABS
2.0000 | ORAL_TABLET | Freq: Every day | ORAL | Status: DC
  Administered 2015-04-19 – 2015-04-20 (×2): 17.2 mg via ORAL
  Filled 2015-04-19 (×2): qty 2

## 2015-04-19 NOTE — Evaluation (Addendum)
Physical Therapy Evaluation Patient Details Name: SKYLEEN WATES MRN: [id number] DOB: 08-17-1929 Today's Date: 04/19/2015   History of Present Illness  82 year old female with a history of Sjogren syndrome, dementia, peripheral neuropathy, hypertension, depression, paroxysmal atrial fibrillation presented with one-week history of increasing generalized weakness and confusion, Likely due to dehydration  and possible UTI  Clinical Impression  Patient appears concerned for Dc plan. States  That she would go to [place] again. Patient is home alone, has fallen several times. No family present. Pt admitted with above diagnosis. Pt currently with functional limitations due to the deficits listed below (see PT Problem List).  Pt will benefit from skilled PT to increase their independence and safety with mobility to allow discharge to the venue listed below.        Follow Up Recommendations SNF;Supervision/Assistance - 24 hour    Equipment Recommendations  None recommended by PT    Recommendations for Other Services       Precautions / Restrictions Precautions Precautions: Fall Precaution Comments: incontinent      Mobility  Bed Mobility Overal bed mobility: Needs Assistance Bed Mobility: Supine to Sit;Sit to Supine     Supine to sit: Mod assist Sit to supine: Max assist   General bed mobility comments: assist with trunk and legs  Transfers Overall transfer level: Needs assistance Equipment used: Rolling walker (2 wheeled) Transfers: Sit to/from Stand Sit to Stand: Max assist;From elevated surface         General transfer comment: attempted x 3 with legs not straighteneing and buckling  Ambulation/Gait             General Gait Details: unable  Stairs            Wheelchair Mobility    Modified Rankin (Stroke Patients Only)       Balance Overall balance assessment: Needs assistance;History of Falls Sitting-balance support: Feet supported;Bilateral upper  extremity supported Sitting balance-Leahy Scale: Fair     Standing balance support: During functional activity;Bilateral upper extremity supported Standing balance-Leahy Scale: Zero                               Pertinent Vitals/Pain Pain Assessment: Faces Faces Pain Scale: Hurts even more Pain Location: feet and HA Pain Descriptors / Indicators: Numbness;Aching;Pressure;Pounding    Home Living Family/patient expects to be discharged to:: Private residence Living Arrangements: Alone Available Help at Discharge: Personal care attendant;Available PRN/intermittently Type of Home: House Home Access: Level entry     Home Layout: One level Home Equipment: Walker - 4 wheels;Cane - single point;Grab bars - tub/shower;Bedside commode;Shower seat;Cane - quad      Prior Function Level of Independence: Needs assistance               Hand Dominance        Extremity/Trunk Assessment   Upper Extremity Assessment: Generalized weakness           Lower Extremity Assessment: Generalized weakness;RLE deficits/detail;LLE deficits/detail      Cervical / Trunk Assessment: Kyphotic  Communication      Cognition Arousal/Alertness: Awake/alert Behavior During Therapy: Anxious Overall Cognitive Status: Impaired/Different from baseline Area of Impairment: Orientation Orientation Level: Time                  General Comments      Exercises        Assessment/Plan    PT Assessment Patient needs continued PT services  PT  Diagnosis Difficulty walking;Generalized weakness;Acute pain   PT Problem List Decreased strength;Decreased range of motion;Decreased activity tolerance;Decreased balance;Decreased mobility;Decreased cognition;Decreased knowledge of precautions;Decreased safety awareness;Pain;Decreased knowledge of use of DME  PT Treatment Interventions Gait training;Functional mobility training;Therapeutic activities;Therapeutic exercise;Patient/family  education   PT Goals (Current goals can be found in the Care Plan section) Acute Rehab PT Goals Patient Stated Goal: to get my finances inorder. PT Goal Formulation: With patient Time For Goal Achievement: 05/03/15 Potential to Achieve Goals: Good    Frequency Min 3X/week   Barriers to discharge Decreased caregiver support      Co-evaluation               End of Session   Activity Tolerance: Patient tolerated treatment well Patient left: in bed;with call bell/phone within reach;with bed alarm set;with nursing/sitter in room Nurse Communication: Mobility status         Time: [id number] PT Time Calculation (min) (ACUTE ONLY): 31 min   Charges:   PT Evaluation $PT Eval Low Complexity: 1 Procedure PT Treatments $Therapeutic Activity: 8-22 mins   PT G Codes:        Claretha Cooper 04/19/2015, 3:29 PM

## 2015-04-19 NOTE — Progress Notes (Signed)
PT Cancellation Note  Patient Details Name: Ariana [name] MRN: [id number] DOB: 21-Jan-1930   Cancelled Treatment:    Reason Eval/Treat Not Completed: Patient declined, no reason specified   Claretha Cooper 04/19/2015, 11:07 AM Tresa Endo PT [phone]

## 2015-04-19 NOTE — Progress Notes (Signed)
PROGRESS NOTE  Ariana [name] [id number] DOB: 06/23/1929 DOA: 04/18/2015 PCP: Hollace Kinnier, DO Brief History 82 year old female with a history of Sjogren syndrome, dementia, peripheral neuropathy, hypertension, depression, paroxysmal atrial fibrillation presented with one-week history of increasing generalized weakness and confusion. Unfortunately, the patient is unable to provide any significant history secondary to dementia and present encephalopathy.  Apparently, the patient's caregivers have noted that the patient has had increasing confusion and generalized weakness over the past week. The patient was trying to get out of bed on 04/18/15, she but had difficulty and slid onto her stool and was unable to get up from her stool. The patient called her daughter while sitting on her stool, and she told her daughter she was unable to get up off the stool. When the patient's family checked up on her, she was too weak to get up, but the patient was awake and alert although confused. As a result, EMS was activated. There has been no recent medication changes. No reports of headaches, chest pain, shortness breath, vomiting, diarrhea, fevers, chills. In the emergency department, the patient was afebrile hemodynamically stable without tachycardia. Oxygen saturation was 98% on room air. BMP and CBC were unremarkable. Hepatic enzymes were unremarkable. Urinalysis showed 6-30 WBC. CT of the abdomen and pelvis showed increasing size of left renal cyst without any new acute findings. EKG shows sinus rhythm with right bundle branch block Assessment/Plan: Acute encephalopathy -Likely due to dehydration (patient is hemoconcentrated) and possible UTI -Continue ceftriaxone -Continue intravenous fluids -TSH--1.681 -Serum B12--521 -CT brain--neg -04/19/15--mental status improving Pyuria -Continue ceftriaxone pending culture data Dehydration -Although the patient does have Sjogren's syndrome, clinically the  patient appears hypovolemic -IV fluids Dementia -Continue Namenda -The patient has had a cognitive and functional decline over this past year according to the patient's daughter Paroxysmal atrial fibrillation -Presently in sinus rhythm -Not a candidate for anticoagulation secondary to falls -Continue aspirin -Continue metoprolol tartrate -Hold Cardizem CD today and reevaluate blood pressure for restarting on 04/20/2015 Peripheral neuropathy -Continue Lyrica Hyperlipidemia -Continue pravastatin Chronic back pain -Hold opioids for now in the setting of confusion   Family Communication:  Daughter updated at beside 04/18/15 Disposition Plan:  SNF 04/20/15 if stable     Procedures/Studies: Dg Chest 2 View  04/18/2015  CLINICAL DATA:  Weakness since yesterday.  Hypertension. EXAM: CHEST  2 VIEW COMPARISON:  03/01/2015 FINDINGS: Mild cardiomegaly is stable. Mild scarring in right lung base is stable. No evidence of pulmonary edema or consolidation. No evidence pleural effusion or pneumothorax. IMPRESSION: Mild cardiomegaly.  No active lung disease. Electronically Signed   By: Earle Gell M.D.   On: 04/18/2015 15:40   Ct Head Wo Contrast  04/19/2015  CLINICAL DATA:  Acute encephalopathy. EXAM: CT HEAD WITHOUT CONTRAST TECHNIQUE: Contiguous axial images were obtained from the base of the skull through the vertex without intravenous contrast. COMPARISON:  Head CT 12/16/2014.  Brain MRI in 11/2019 16 FINDINGS: Generalized cerebral and cerebellar atrophy. Age related chronic small vessel ischemia. Encephalomalacia in the right frontal and parietal lobes consistent with remote infarcts.No intracranial hemorrhage, mass effect, or midline shift. No hydrocephalus. The basilar cisterns are patent. No evidence of territorial infarct. No intracranial fluid collection. Calvarium is intact. Chronic deformity of right maxillary sinus and postsurgical change in the zygomatic arch. No paranasal sinus mucosal  thickening. Mastoid air cells are well aerated. IMPRESSION: Stable atrophy, chronic small vessel ischemia, and remote infarcts. No CT findings of  superimposed acute abnormality. Electronically Signed   By: Jeb Levering M.D.   On: 04/19/2015 01:27   Ct Abdomen Pelvis W Contrast  04/18/2015  CLINICAL DATA:  Weakness for 2 days. EXAM: CT ABDOMEN AND PELVIS WITH CONTRAST TECHNIQUE: Multidetector CT imaging of the abdomen and pelvis was performed using the standard protocol following bolus administration of intravenous contrast. CONTRAST:  2mL OMNIPAQUE IOHEXOL 300 MG/ML SOLN, 141mL OMNIPAQUE IOHEXOL 300 MG/ML SOLN COMPARISON:  12/07/2014 FINDINGS: Lower chest:  No acute findings. Hepatobiliary: No focal liver abnormality. Previous cholecystectomy. Fusiform dilatation of the common bile duct measures up to 8 mm. Pancreas: No mass, inflammatory changes, or other significant abnormality. Spleen: Within normal limits in size and appearance. Adrenals/Urinary Tract: Intermediate attenuating cyst arising from the mid left kidney has increased in size from previous exam and now measures 1.7 cm and 29 HU. On the previous exam this structure measured 0.8 cm. No obstructive uropathy. The urinary bladder appears within normal limits. Stomach/Bowel: The stomach is normal. The small bowel loops have at normal course and caliber. No obstruction. No pathologic dilatation of the colon. Vascular/Lymphatic: No pathologically enlarged lymph nodes. No evidence of abdominal aortic aneurysm. Reproductive: No mass or other significant abnormality. Other: No free fluid or fluid collections. Small periumbilical hernia contains fat only. Musculoskeletal: No aggressive lytic or sclerotic bone lesions. Degenerative disc disease is noted at the L5-S1 level. IMPRESSION: 1. No acute findings identified within the abdomen or pelvis. No explanation for patient's weakness. 2. Increase in size of intermediate attenuating lesion within the left  kidney. Nonspecific. Recommend further investigation with nonemergent renal mass protocol MRI or CT. 3. Increase caliber of the CBD status post cholecystectomy. 4. Lumbar degenerative disc disease. Electronically Signed   By: Kerby Moors M.D.   On: 04/18/2015 17:03         Subjective: Patient is more alert today. She denies fevers, chills, chest pain, shortness breath, nausea, vomiting, diarrhea. She complains of some right-sided abdominal pain. She denies any hematochezia, melena, headache, neck pain.  Objective: Filed Vitals:   04/18/15 1900 04/18/15 2000 04/18/15 2044 04/19/15 0446  BP: 151/62 97/68 141/64 131/52  Pulse: 56 55 54 51  Temp:   97.9 F (36.6 C) 97.6 F (36.4 C)  TempSrc:   Oral Oral  Resp: 16 13 16 16   Height:   5\' 3"  (1.6 m)   Weight:   66.679 kg (147 lb)   SpO2: 97% 96% 98% 99%    Intake/Output Summary (Last 24 hours) at 04/19/15 1117 Last data filed at 04/19/15 0228  Gross per 24 hour  Intake      0 ml  Output    301 ml  Net   -301 ml   Weight change:  Exam:   General:  Pt is alert, follows commands appropriately, not in acute distress  HEENT: No icterus, No thrush, No neck mass, [place]/AT  Cardiovascular: RRR, S1/S2, no rubs, no gallops  Respiratory: CTA bilaterally, no wheezing, no crackles, no rhonchi  Abdomen: Soft/+BS, non tender, non distended, no guarding  Extremities: No edema, No lymphangitis, No petechiae, No rashes, no synovitis  Data Reviewed: Basic Metabolic Panel:  Recent Labs Lab 04/18/15 1535  NA 140  K 4.1  CL 103  CO2 28  GLUCOSE 134*  BUN 18  CREATININE 0.68  CALCIUM 10.0   Liver Function Tests:  Recent Labs Lab 04/18/15 1535  AST 24  ALT 19  ALKPHOS 93  BILITOT 0.6  PROT 7.7  ALBUMIN 4.3  No results for input(s): LIPASE, AMYLASE in the last 168 hours. No results for input(s): AMMONIA in the last 168 hours. CBC:  Recent Labs Lab 04/18/15 1535  WBC 7.5  NEUTROABS 5.6  HGB 13.6  HCT 42.4  MCV  92.8  PLT 205   Cardiac Enzymes: No results for input(s): CKTOTAL, CKMB, CKMBINDEX, TROPONINI in the last 168 hours. BNP: Invalid input(s): POCBNP CBG: No results for input(s): GLUCAP in the last 168 hours.  No results found for this or any previous visit (from the past 240 hour(s)).   Scheduled Meds: . aspirin  325 mg Oral Daily  . cefTRIAXone (ROCEPHIN)  IV  1 g Intravenous Q24H  . DULoxetine  60 mg Oral Daily  . enoxaparin (LOVENOX) injection  40 mg Subcutaneous QHS  . hydroxychloroquine  200 mg Oral 2 times per day on Mon Tue Wed Thu Fri  . lactose free nutrition  237 mL Oral BID BM  . lubiprostone  24 mcg Oral BID WC  . memantine  10 mg Oral BID  . metoprolol  50 mg Oral BID  . mirabegron ER  25 mg Oral Daily  . pantoprazole  40 mg Oral Daily  . pilocarpine  5 mg Oral BID  . pravastatin  20 mg Oral q1800  . pregabalin  75 mg Oral BID   Continuous Infusions:    Saw Mendenhall, DO  [facility] Pager [phone]  If 7PM-7AM, please contact night-coverage [email or web address] Password [email or web address] 04/19/2015, 11:17 AM   LOS: 1 day

## 2015-04-20 DIAGNOSIS — K219 Gastro-esophageal reflux disease without esophagitis: Secondary | ICD-10-CM | POA: Diagnosis not present

## 2015-04-20 DIAGNOSIS — G9340 Encephalopathy, unspecified: Secondary | ICD-10-CM | POA: Diagnosis not present

## 2015-04-20 DIAGNOSIS — I480 Paroxysmal atrial fibrillation: Secondary | ICD-10-CM | POA: Diagnosis not present

## 2015-04-20 DIAGNOSIS — F0390 Unspecified dementia without behavioral disturbance: Secondary | ICD-10-CM | POA: Diagnosis not present

## 2015-04-20 DIAGNOSIS — F341 Dysthymic disorder: Secondary | ICD-10-CM | POA: Diagnosis not present

## 2015-04-20 DIAGNOSIS — R1310 Dysphagia, unspecified: Secondary | ICD-10-CM | POA: Diagnosis not present

## 2015-04-20 DIAGNOSIS — I10 Essential (primary) hypertension: Secondary | ICD-10-CM | POA: Diagnosis not present

## 2015-04-20 DIAGNOSIS — F329 Major depressive disorder, single episode, unspecified: Secondary | ICD-10-CM | POA: Diagnosis not present

## 2015-04-20 DIAGNOSIS — F419 Anxiety disorder, unspecified: Secondary | ICD-10-CM | POA: Diagnosis not present

## 2015-04-20 DIAGNOSIS — R488 Other symbolic dysfunctions: Secondary | ICD-10-CM | POA: Diagnosis not present

## 2015-04-20 DIAGNOSIS — G6289 Other specified polyneuropathies: Secondary | ICD-10-CM | POA: Diagnosis not present

## 2015-04-20 DIAGNOSIS — Z5189 Encounter for other specified aftercare: Secondary | ICD-10-CM | POA: Diagnosis not present

## 2015-04-20 DIAGNOSIS — R5381 Other malaise: Secondary | ICD-10-CM | POA: Diagnosis not present

## 2015-04-20 DIAGNOSIS — R296 Repeated falls: Secondary | ICD-10-CM | POA: Diagnosis not present

## 2015-04-20 DIAGNOSIS — G629 Polyneuropathy, unspecified: Secondary | ICD-10-CM | POA: Diagnosis not present

## 2015-04-20 DIAGNOSIS — R682 Dry mouth, unspecified: Secondary | ICD-10-CM | POA: Diagnosis not present

## 2015-04-20 DIAGNOSIS — R2681 Unsteadiness on feet: Secondary | ICD-10-CM | POA: Diagnosis not present

## 2015-04-20 DIAGNOSIS — M3500 Sicca syndrome, unspecified: Secondary | ICD-10-CM | POA: Diagnosis not present

## 2015-04-20 DIAGNOSIS — M549 Dorsalgia, unspecified: Secondary | ICD-10-CM | POA: Diagnosis not present

## 2015-04-20 DIAGNOSIS — N390 Urinary tract infection, site not specified: Secondary | ICD-10-CM | POA: Diagnosis not present

## 2015-04-20 DIAGNOSIS — E860 Dehydration: Secondary | ICD-10-CM | POA: Diagnosis not present

## 2015-04-20 DIAGNOSIS — R1311 Dysphagia, oral phase: Secondary | ICD-10-CM | POA: Diagnosis not present

## 2015-04-20 DIAGNOSIS — M6281 Muscle weakness (generalized): Secondary | ICD-10-CM | POA: Diagnosis not present

## 2015-04-20 DIAGNOSIS — D509 Iron deficiency anemia, unspecified: Secondary | ICD-10-CM | POA: Diagnosis not present

## 2015-04-20 DIAGNOSIS — K5903 Drug induced constipation: Secondary | ICD-10-CM | POA: Diagnosis not present

## 2015-04-20 LAB — BASIC METABOLIC PANEL
Anion gap: 7 (ref 5–15)
BUN: 13 mg/dL (ref 4–21)
BUN: 13 mg/dL (ref 6–20)
CHLORIDE: 110 mmol/L (ref 101–111)
CO2: 26 mmol/L (ref 22–32)
CREATININE: 0.6 mg/dL (ref 0.5–1.1)
Calcium: 9.2 mg/dL (ref 8.9–10.3)
Creatinine, Ser: 0.65 mg/dL (ref 0.44–1.00)
GFR calc Af Amer: >60 mL/min (ref >60)
GFR calc non Af Amer: >60 mL/min (ref >60)
GLUCOSE: 134 mg/dL
Glucose, Bld: 134 mg/dL — ABNORMAL HIGH (ref 65–99)
POTASSIUM: 3.5 mmol/L (ref 3.5–5.1)
SODIUM: 143 mmol/L (ref 137–147)
SODIUM: 143 mmol/L (ref 135–145)

## 2015-04-20 LAB — HEMOGLOBIN A1C
Hgb A1c MFr Bld: 6.4 % — ABNORMAL HIGH (ref 4.8–5.6)
Mean Plasma Glucose: 137 mg/dL

## 2015-04-20 MED ORDER — ALPRAZOLAM 0.5 MG PO TABS: 0.5000 mg | ORAL_TABLET | Freq: Two times a day (BID) | ORAL | Status: DC | PRN

## 2015-04-20 MED ORDER — HYDROCODONE-ACETAMINOPHEN 5-325 MG PO TABS: 1.0000 | ORAL_TABLET | Freq: Four times a day (QID) | ORAL | Status: DC | PRN

## 2015-04-20 MED ORDER — SENNA 8.6 MG PO TABS: 2.0000 | ORAL_TABLET | Freq: Every day | ORAL | Status: DC

## 2015-04-20 MED ORDER — CEFDINIR 300 MG PO CAPS
300.0000 mg | ORAL_CAPSULE | Freq: Two times a day (BID) | ORAL | Status: DC
Start: 2015-04-20 — End: 2015-05-05

## 2015-04-20 NOTE — Discharge Summary (Signed)
Physician Discharge Summary  Ariana [name] [id number] DOB: 10-18-1929 DOA: 04/18/2015  PCP: Hollace Kinnier, DO  Admit date: 04/18/2015 Discharge date: 04/20/2015  Recommendations for Outpatient Follow-up:  1. Pt will need to follow up with PCP in 2 weeks post discharge   Discharge Diagnoses:  Acute encephalopathy -Likely due to dehydration (patient is hemoconcentrated) and possible UTI -Continue ceftriaxone D#3 -discharge with omnicef po -Continue intravenous fluids -TSH--1.681 -Serum B12--521 -CT brain--neg -04/19/15--mental status improving -Although the patient still had some episodes of sundowning and intermittent confusion, overall, she is more alert and able to identify why she was in the hospital, identify the name of the hospital, and identified present in the [place]. UTI -Continue ceftriaxone during hospitalization -d/c with omnicef po bid Dehydration -Although the patient does have Sjogren's syndrome, clinically the patient appears hypovolemic -IV fluids -improved Dementia -Continue Namenda -The patient has had a cognitive and functional decline over this past year according to the patient's daughter Paroxysmal atrial fibrillation -Presently in sinus rhythm -Not a candidate for anticoagulation secondary to falls -Continue aspirin -Continue metoprolol tartrate -Hold Cardizem CD today--due to borderline bradycardia  55-65 -BP remains stable -not a candidate for anticoagulation due to falls Peripheral neuropathy -Continue Lyrica Hyperlipidemia -Continue pravastatin Chronic back pain -Hold opioids for now in the setting of confusion during the hospitalization -judicious use after d/c for chronic back pain    Discharge Condition: stable  Disposition: SNF  Diet:heart healthy Wt Readings from Last 3 Encounters:  04/18/15 66.679 kg (147 lb)  04/07/15 68.947 kg (152 lb)  03/01/15 65.772 kg (145 lb)    History of present illness:  82 year old female  with a history of Sjogren syndrome, dementia, peripheral neuropathy, hypertension, depression, paroxysmal atrial fibrillation presented with one-week history of increasing generalized weakness and confusion. Unfortunately, the patient is unable to provide any significant history secondary to dementia and present encephalopathy. Apparently, the patient's caregivers have noted that the patient has had increasing confusion and generalized weakness over the past week. The patient was trying to get out of bed on 04/18/15, she but had difficulty and slid onto her stool and was unable to get up from her stool. The patient called her daughter while sitting on her stool, and she told her daughter she was unable to get up off the stool. When the patient's family checked up on her, she was too weak to get up, but the patient was awake and alert although confused. As a result, EMS was activated. There has been no recent medication changes. No reports of headaches, chest pain, shortness breath, vomiting, diarrhea, fevers, chills. In the emergency department, the patient was afebrile hemodynamically stable without tachycardia. Oxygen saturation was 98% on room air. BMP and CBC were unremarkable. Hepatic enzymes were unremarkable. Urinalysis showed 6-30 WBC. CT of the abdomen and pelvis showed increasing size of left renal cyst without any new acute findings. EKG shows sinus rhythm with right bundle branch block The patient was started on empiric antibiotics for suspected UTI. Once cultures were identified, the patient was switched to oral antibiotics. The patient was started on intravenous fluids. Overall, the patient's mental status improved with increasing orientation although the patient still had episodes of intermittent confusion and sundowning. The patient's daughter confirmed the improvement. The patient's daughter was agreeable to skilled nursing facility to which the patient will be discharged.    Discharge  Exam: Filed Vitals:   04/20/15 0515 04/20/15 0859  BP: 154/74 152/54  Pulse: 55 61  Temp: 97.8 F (  36.6 C)   Resp: 14 14   Filed Vitals:   04/19/15 1513 04/19/15 2022 04/20/15 0515 04/20/15 0859  BP: 142/81 162/63 154/74 152/54  Pulse: 61 64 55 61  Temp: 97.4 F (36.3 C) 97.3 F (36.3 C) 97.8 F (36.6 C)   TempSrc: Oral Oral Oral   Resp: 18 16 14 14   Height:      Weight:      SpO2: 99% 98% 96% 99%   General: Awake and alert, NAD, pleasant, cooperative Cardiovascular: RRR, no rub, no gallop, no S3 Respiratory: Poor inspiratory effort but clear to auscultation. Abdomen:soft, nontender, nondistended, positive bowel sounds Extremities: No edema, No lymphangitis, no petechiae  Discharge Instructions      Discharge Instructions    Diet - low sodium heart healthy    Complete by:  As directed      Diet - low sodium heart healthy    Complete by:  As directed      Increase activity slowly    Complete by:  As directed      Increase activity slowly    Complete by:  As directed             Medication List    STOP taking these medications        diltiazem 180 MG 24 hr capsule  Commonly known as:  CARDIZEM CD     nystatin 100000 UNIT/ML suspension  Commonly known as:  MYCOSTATIN     triamcinolone ointment 0.1 %  Commonly known as:  KENALOG      TAKE these medications        ALPRAZolam 0.5 MG tablet  Commonly known as:  XANAX  Take 1 tablet (0.5 mg total) by mouth 2 (two) times daily as needed for anxiety.     aspirin 325 MG tablet  Take 1 tablet (325 mg total) by mouth daily.     bisacodyl 5 MG EC tablet  Commonly known as:  bisacodyl  Take 2 tablets (10 mg total) by mouth daily as needed for moderate constipation. If no effective bowel movement every 2- 3 days     CALCIUM CARBONATE PO  Take 1 tablet by mouth daily.     cefdinir 300 MG capsule  Commonly known as:  OMNICEF  Take 1 capsule (300 mg total) by mouth 2 (two) times daily.     cetirizine 10 MG  tablet  Commonly known as:  ZYRTEC  Take 1 tablet (10 mg total) by mouth daily.     CRANBERRY SOFT PO  Take 1 capsule by mouth daily.     docusate sodium 100 MG capsule  Commonly known as:  COLACE  Take 100 mg by mouth daily as needed for mild constipation.     DULoxetine 60 MG capsule  Commonly known as:  CYMBALTA  Take one tablet by mouth in the evening once daily for depression     DULoxetine 30 MG capsule  Commonly known as:  CYMBALTA  Take one tablet  once daily in the morning for depression     HYDROcodone-acetaminophen 5-325 MG tablet  Commonly known as:  NORCO/VICODIN  Take 1 tablet by mouth every 6 (six) hours as needed for moderate pain. Take one tablet twice daily for pain     hydroxychloroquine 200 MG tablet  Commonly known as:  PLAQUENIL  Take 200 mg by mouth. Take one tablet twice daily Monday thru Friday, only     lactose free nutrition Liqd  Take 237 mLs by  mouth 2 (two) times daily between meals.     lansoprazole 30 MG capsule  Commonly known as:  PREVACID  TAKE ONE CAPSULE BY MOUTH EVERY DAY AT 12 NOON     lubiprostone 24 MCG capsule  Commonly known as:  AMITIZA  Take 1 capsule (24 mcg total) by mouth 2 (two) times daily with a meal.     LYRICA 75 MG capsule  Generic drug:  pregabalin  TAKE ONE CAPSULE BY MOUTH TWICE A DAY     memantine 10 MG tablet  Commonly known as:  NAMENDA  Take 1 tablet (10 mg total) by mouth 2 (two) times daily.     metoprolol 50 MG tablet  Commonly known as:  LOPRESSOR  Take 1 tablet (50 mg total) by mouth 2 (two) times daily.     mirabegron ER 25 MG Tb24 tablet  Commonly known as:  MYRBETRIQ  Take 1 tablet (25 mg total) by mouth daily.     pilocarpine 5 MG tablet  Commonly known as:  SALAGEN  Take 5 mg by mouth 2 (two) times daily.     pravastatin 20 MG tablet  Commonly known as:  PRAVACHOL  Take 1 tablet (20 mg total) by mouth daily at 6 PM.     REFRESH TEARS 0.5 % Soln  Generic drug:  carboxymethylcellulose    Place 1 drop into both eyes 2 (two) times daily as needed (dry eyes).     senna 8.6 MG Tabs tablet  Commonly known as:  SENOKOT  Take 2 tablets (17.2 mg total) by mouth daily.     sodium chloride 0.65 % Soln nasal spray  Commonly known as:  OCEAN  Place 1 spray into the nose as needed for congestion.         The results of significant diagnostics from this hospitalization (including imaging, microbiology, ancillary and laboratory) are listed below for reference.    Significant Diagnostic Studies: Dg Chest 2 View  04/18/2015  CLINICAL DATA:  Weakness since yesterday.  Hypertension. EXAM: CHEST  2 VIEW COMPARISON:  03/01/2015 FINDINGS: Mild cardiomegaly is stable. Mild scarring in right lung base is stable. No evidence of pulmonary edema or consolidation. No evidence pleural effusion or pneumothorax. IMPRESSION: Mild cardiomegaly.  No active lung disease. Electronically Signed   By: Earle Gell M.D.   On: 04/18/2015 15:40   Ct Head Wo Contrast  04/19/2015  CLINICAL DATA:  Acute encephalopathy. EXAM: CT HEAD WITHOUT CONTRAST TECHNIQUE: Contiguous axial images were obtained from the base of the skull through the vertex without intravenous contrast. COMPARISON:  Head CT 12/16/2014.  Brain MRI in 11/2019 16 FINDINGS: Generalized cerebral and cerebellar atrophy. Age related chronic small vessel ischemia. Encephalomalacia in the right frontal and parietal lobes consistent with remote infarcts.No intracranial hemorrhage, mass effect, or midline shift. No hydrocephalus. The basilar cisterns are patent. No evidence of territorial infarct. No intracranial fluid collection. Calvarium is intact. Chronic deformity of right maxillary sinus and postsurgical change in the zygomatic arch. No paranasal sinus mucosal thickening. Mastoid air cells are well aerated. IMPRESSION: Stable atrophy, chronic small vessel ischemia, and remote infarcts. No CT findings of superimposed acute abnormality. Electronically Signed    By: Jeb Levering M.D.   On: 04/19/2015 01:27   Ct Abdomen Pelvis W Contrast  04/18/2015  CLINICAL DATA:  Weakness for 2 days. EXAM: CT ABDOMEN AND PELVIS WITH CONTRAST TECHNIQUE: Multidetector CT imaging of the abdomen and pelvis was performed using the standard protocol following bolus administration of  intravenous contrast. CONTRAST:  21mL OMNIPAQUE IOHEXOL 300 MG/ML SOLN, 130mL OMNIPAQUE IOHEXOL 300 MG/ML SOLN COMPARISON:  12/07/2014 FINDINGS: Lower chest:  No acute findings. Hepatobiliary: No focal liver abnormality. Previous cholecystectomy. Fusiform dilatation of the common bile duct measures up to 8 mm. Pancreas: No mass, inflammatory changes, or other significant abnormality. Spleen: Within normal limits in size and appearance. Adrenals/Urinary Tract: Intermediate attenuating cyst arising from the mid left kidney has increased in size from previous exam and now measures 1.7 cm and 29 HU. On the previous exam this structure measured 0.8 cm. No obstructive uropathy. The urinary bladder appears within normal limits. Stomach/Bowel: The stomach is normal. The small bowel loops have at normal course and caliber. No obstruction. No pathologic dilatation of the colon. Vascular/Lymphatic: No pathologically enlarged lymph nodes. No evidence of abdominal aortic aneurysm. Reproductive: No mass or other significant abnormality. Other: No free fluid or fluid collections. Small periumbilical hernia contains fat only. Musculoskeletal: No aggressive lytic or sclerotic bone lesions. Degenerative disc disease is noted at the L5-S1 level. IMPRESSION: 1. No acute findings identified within the abdomen or pelvis. No explanation for patient's weakness. 2. Increase in size of intermediate attenuating lesion within the left kidney. Nonspecific. Recommend further investigation with nonemergent renal mass protocol MRI or CT. 3. Increase caliber of the CBD status post cholecystectomy. 4. Lumbar degenerative disc disease.  Electronically Signed   By: Kerby Moors M.D.   On: 04/18/2015 17:03     Microbiology: Recent Results (from the past 240 hour(s))  Urine culture     Status: None (Preliminary result)   Collection Time: 04/18/15  3:50 PM  Result Value Ref Range Status   Specimen Description URINE, CATHETERIZED  Final   Special Requests NONE  Final   Culture   Final    >=100,000 COLONIES/mL ESCHERICHIA COLI >=100,000 COLONIES/mL ENTEROBACTER AEROGENES Performed at [facility]    Report Status PENDING  Incomplete   Organism ID, Bacteria ESCHERICHIA COLI  Final   Organism ID, Bacteria ENTEROBACTER AEROGENES  Final      Susceptibility   Enterobacter aerogenes - MIC*    CEFAZOLIN <=4 RESISTANT Resistant     CEFTRIAXONE <=1 SENSITIVE Sensitive     CIPROFLOXACIN <=0.25 SENSITIVE Sensitive     GENTAMICIN <=1 SENSITIVE Sensitive     IMIPENEM 1 SENSITIVE Sensitive     NITROFURANTOIN 64 INTERMEDIATE Intermediate     TRIMETH/SULFA <=20 SENSITIVE Sensitive     PIP/TAZO <=4 SENSITIVE Sensitive     * >=100,000 COLONIES/mL ENTEROBACTER AEROGENES   Escherichia coli - MIC*    AMPICILLIN >=32 RESISTANT Resistant     CEFAZOLIN <=4 SENSITIVE Sensitive     CEFTRIAXONE <=1 SENSITIVE Sensitive     CIPROFLOXACIN <=0.25 SENSITIVE Sensitive     GENTAMICIN <=1 SENSITIVE Sensitive     IMIPENEM <=0.25 SENSITIVE Sensitive     NITROFURANTOIN <=16 SENSITIVE Sensitive     TRIMETH/SULFA <=20 SENSITIVE Sensitive     AMPICILLIN/SULBACTAM 4 SENSITIVE Sensitive     PIP/TAZO <=4 SENSITIVE Sensitive     * >=100,000 COLONIES/mL ESCHERICHIA COLI     Labs: Basic Metabolic Panel:  Recent Labs Lab 04/18/15 1535 04/20/15 0332  NA 140 143  K 4.1 3.5  CL 103 110  CO2 28 26  GLUCOSE 134* 134*  BUN 18 13  CREATININE 0.68 0.65  CALCIUM 10.0 9.2   Liver Function Tests:  Recent Labs Lab 04/18/15 1535  AST 24  ALT 19  ALKPHOS 93  BILITOT 0.6  PROT 7.7  ALBUMIN 4.3   No results for input(s): LIPASE, AMYLASE  in the last 168 hours. No results for input(s): AMMONIA in the last 168 hours. CBC:  Recent Labs Lab 04/18/15 1535  WBC 7.5  NEUTROABS 5.6  HGB 13.6  HCT 42.4  MCV 92.8  PLT 205   Cardiac Enzymes: No results for input(s): CKTOTAL, CKMB, CKMBINDEX, TROPONINI in the last 168 hours. BNP: Invalid input(s): POCBNP CBG: No results for input(s): GLUCAP in the last 168 hours.  Time coordinating discharge:  Greater than 30 minutes  Signed:  Dayon Witt, DO [facility] Pager: [phone] 04/20/2015, 2:20 PM

## 2015-04-20 NOTE — NC FL2 (Signed)
[place] LEVEL OF CARE SCREENING TOOL     IDENTIFICATION  Patient Name: Ariana [name] Birthdate: 1929/12/28 Sex: female Admission Date (Current Location): 04/18/2015  [facility] and [place] Number:  Herbalist and Address:  [facility],  [place] [address] [place]      Provider Number: [id number]  Attending Physician Name and Address:  Orson Eva, MD  Relative Name and Phone Number:       Current Level of Care: Hospital Recommended Level of Care: [place] Prior Approval Number:    Date Approved/Denied:   PASRR Number:    Discharge Plan: SNF    Current Diagnoses: Patient Active Problem List   Diagnosis Date Noted  . Paroxysmal atrial fibrillation ([place]) 04/18/2015  . Dehydration 04/18/2015  . Dementia without behavioral disturbance 04/18/2015  . TIA (transient ischemic attack) 12/17/2014  . Paroxysmal a-fib ([place]) 12/16/2014  . Chest pain 12/16/2014  . Acute encephalopathy 12/04/2014  . Fall   . AKI (acute kidney injury) ([place]) 12/03/2014  . History of cerebrovascular disease 09/24/2014  . Falls frequently 07/28/2014  . Infarction of parietal lobe ([place])   . Mild dementia   . CVA (cerebral infarction) 07/26/2014  . Atrial fibrillation with RVR ([place]) 07/03/2014  . Constipation 04/15/2014  . Mixed stress and urge urinary incontinence 11/03/2013  . Therapeutic opioid induced constipation 11/03/2013  . Hemorrhoid 11/03/2013  . Low back pain associated with a spinal disorder other than radiculopathy or spinal stenosis 11/03/2013  . Protein-calorie malnutrition, severe ([place]) 07/22/2013  . UTI (lower urinary tract infection) 07/20/2013  . Prolonged QT interval 07/20/2013  . Osteopenia 07/17/2013  . Palpitations 06/30/2013  . Hereditary and idiopathic peripheral neuropathy 05/22/2013  . Abnormality of gait 12/05/2012  . Headache(784.0) 09/05/2012  . Multinodular thyroid 01/15/2012  . Neck pain 01/15/2012  .  Senile dementia 07/14/2011  . Hypercholesterolemia 07/08/2010  . DRY EYE SYNDROME 07/06/2009  . [place] SYNDROME 07/06/2009  . Urge urinary incontinence 01/06/2009  . COLONIC POLYPS, ADENOMATOUS, HX OF 04/15/2008  . MITRAL VALVE PROLAPSE 11/05/2007  . ANXIETY DEPRESSION 07/02/2007  . Mononeuritis 07/02/2007  . HYPERTENSION, BENIGN 07/02/2007  . GERD 07/02/2007  . Irritable bowel syndrome 07/02/2007  . Fibromyalgia 07/02/2007    Orientation RESPIRATION BLADDER Height & Weight     Self, Time, Situation, Place  Normal Continent Weight: 147 lb (66.679 kg) Height:  5\' 3"  (160 cm)  BEHAVIORAL SYMPTOMS/MOOD NEUROLOGICAL BOWEL NUTRITION STATUS   (no behaviors)  (NONE) Incontinent Diet (Diet Soft)  AMBULATORY STATUS COMMUNICATION OF NEEDS Skin   Extensive Assist Verbally Normal                       Personal Care Assistance Level of Assistance  Bathing, Feeding, Dressing Bathing Assistance: Maximum assistance Feeding assistance: Independent Dressing Assistance: Maximum assistance     Functional Limitations Info  Sight, Hearing, Speech Sight Info: Adequate Hearing Info: Adequate Speech Info: Adequate    SPECIAL CARE FACTORS FREQUENCY  PT (By licensed PT), OT (By licensed OT)     PT Frequency: 5 x a week OT Frequency: 5 x a week            Contractures Contractures Info: Not present    Additional Factors Info  Code Status, Allergies Code Status Info: FULL code status Allergies Info: Banana, Codeine, Klonopin, Meperidine Hcl, Norflex, Oxycodone-acetaminophen, Propoxyphene Hcl, Zoloft, Doxycycline, Naproxen, Penicillins, Phenothiazines, Stelazine, Sulfamethoxazole-trimethoprim, Tolectin, Tramadol  Current Medications (04/20/2015):  This is the current hospital active medication list Current Facility-Administered Medications  Medication Dose Route Frequency Provider Last Rate Last Dose  . acetaminophen (TYLENOL) tablet 650 mg  650 mg Oral Q6H PRN Orson Eva, MD   650 mg at 04/20/15 0522   Or  . acetaminophen (TYLENOL) suppository 650 mg  650 mg Rectal Q6H PRN Orson Eva, MD      . ALPRAZolam Duanne Moron) tablet 0.5 mg  0.5 mg Oral BID PRN Orson Eva, MD   0.5 mg at 04/19/15 2218  . aspirin tablet 325 mg  325 mg Oral Daily Orson Eva, MD   325 mg at 04/19/15 0957  . cefTRIAXone (ROCEPHIN) 1 g in dextrose 5 % 50 mL IVPB  1 g Intravenous Q24H Orson Eva, MD   1 g at 04/20/15 0802  . docusate sodium (COLACE) capsule 100 mg  100 mg Oral BID Orson Eva, MD   100 mg at 04/19/15 2218  . DULoxetine (CYMBALTA) DR capsule 60 mg  60 mg Oral Daily Orson Eva, MD   60 mg at 04/19/15 0957  . enoxaparin (LOVENOX) injection 40 mg  40 mg Subcutaneous QHS Orson Eva, MD   40 mg at 04/19/15 2220  . hydroxychloroquine (PLAQUENIL) tablet 200 mg  200 mg Oral 2 times per day on Mon Tue Wed Thu Fri Orson Eva, MD   200 mg at 04/19/15 2318  . iohexol (OMNIPAQUE) 300 MG/ML solution 50 mL  50 mL Oral Once PRN Olivia Canter Sam, PA-C   50 mL at 04/18/15 1630  . lactose free nutrition (BOOST PLUS) liquid 237 mL  237 mL Oral BID BM Orson Eva, MD   237 mL at 04/19/15 1619  . lubiprostone (AMITIZA) capsule 24 mcg  24 mcg Oral BID WC Orson Eva, MD   24 mcg at 04/20/15 0802  . memantine (NAMENDA) tablet 10 mg  10 mg Oral BID Orson Eva, MD   10 mg at 04/19/15 2218  . metoprolol (LOPRESSOR) tablet 50 mg  50 mg Oral BID Orson Eva, MD   50 mg at 04/19/15 2218  . mirabegron ER (MYRBETRIQ) tablet 25 mg  25 mg Oral Daily Orson Eva, MD   25 mg at 04/19/15 0958  . ondansetron (ZOFRAN) tablet 4 mg  4 mg Oral Q6H PRN Orson Eva, MD       Or  . ondansetron [facility]) injection 4 mg  4 mg Intravenous Q6H PRN Orson Eva, MD      . pantoprazole (PROTONIX) EC tablet 40 mg  40 mg Oral Daily Orson Eva, MD   40 mg at 04/19/15 0957  . pilocarpine (SALAGEN) tablet 5 mg  5 mg Oral BID Orson Eva, MD   5 mg at 04/19/15 2218  . polyvinyl alcohol (LIQUIFILM TEARS) 1.4 % ophthalmic solution 1 drop  1 drop Both Eyes BID PRN  Orson Eva, MD      . pravastatin (PRAVACHOL) tablet 20 mg  20 mg Oral q1800 Orson Eva, MD   20 mg at 04/19/15 1759  . pregabalin (LYRICA) capsule 75 mg  75 mg Oral BID Orson Eva, MD   75 mg at 04/19/15 2218  . senna (SENOKOT) tablet 17.2 mg  2 tablet Oral Daily Orson Eva, MD   17.2 mg at 04/19/15 1618  . sodium chloride (OCEAN) 0.65 % nasal spray 1 spray  1 spray Nasal PRN Orson Eva, MD         Discharge Medications: Please see discharge summary for  a list of discharge medications.  Relevant Imaging Results:  Relevant Lab Results:   Additional Information SSN: [id number]  KIDD, Hughes Better A, LCSW

## 2015-04-20 NOTE — Progress Notes (Signed)
Pt for discharge to [facility].   CSW facilitated pt discharge needs including contacting facility, faxing pt discharge information via epic hub, discussing with pt at bedside and pt daughter, Shanieka via telephone, receiving [employer] insurance authorization Josem Kaufmann #: [phone] next review 3/9 [id number]), providing RN phone number to call report, and arranging ambulance transportation for pt to [facility].   Pt expressed frustration because she feels that she can return home, but pt lives alone and not safe discharge plan. Pt daughter discussed that she is overwhelmed with discharge, but agreeable to transition to [facility] today.   No further social work needs identified at this time.  CSW signing off.   Alison Murray, MSW, [place] Work [phone]

## 2015-04-20 NOTE — Clinical Social Work Note (Signed)
Clinical Social Work Assessment  Patient Details  Name: Ariana [name] MRN: [id number] Date of Birth: 1929-11-11  Date of referral:  04/20/15               Reason for consult:  Discharge Planning                Permission sought to share information with:  Family Supports Permission granted to share information::     Name::     Vandana Snelling  Agency::     Relationship::  daughter  Contact Information:  [phone]  Housing/Transportation Living arrangements for the past 2 months:  Single Family Home Source of Information:  Adult Children Patient Interpreter Needed:  None Criminal Activity/Legal Involvement Pertinent to Current Situation/Hospitalization:  No - Comment as needed Significant Relationships:  Adult Children Lives with:  Self Do you feel safe going back to the place where you live?  No Need for family participation in patient care:  Yes (Comment)  Care giving concerns:  Pt admitted from home alone. Pt requiring increased assistance. PT recommending SNF.   Social Worker assessment / plan:    CSW attempted to meet with pt at bedside. Pt pleasantly confused. CSW spoke to pt daughter, Kaniah via telephone re: recommendation for SNF. Pt daughter agreeable. Pt daughter discussed pt has been to [facility] in the past and that would be preference. CSW clarified pt daughter questions about long term care at [facility] and applying for Medicaid. CSW left information at pt bedside for pt daughter regarding applying for Medicaid.   CSW completed FL2 and initiated SNF search to [facility]. CSW spoke with [facility] who reviewed information and confirmed facility could accept pt.   CSW notified pt daughter who is agreeable to plan for [facility].   CSW sent information to [facility] to initiate insurance authorization request for [employer].   CSW to continue to follow to assist with pt discharge to [facility] when [facility] authorization received.    Employment status:  Retired Nurse, adult PT Recommendations:  [place] / Referral to community resources:  [place]  Patient/Family's Response to care:  Pt confused during time of visit, but per RN has times of being oriented. Pt daughter agreeable to [facility] as pt has been to facility in the past. Pt daughter appreciative of information surrounding applying for Medicaid.  Patient/Family's Understanding of and Emotional Response to Diagnosis, Current Treatment, and Prognosis:  Pt daughter displayed understanding surrounding pt diagnosis and treatment. Pt daughter expressed feeling overwhelmed with navigating potential that pt may need long term care.   Emotional Assessment Appearance:  Appears stated age Attitude/Demeanor/Rapport:  Unable to Assess (pt confused) Affect (typically observed):  Accepting Orientation:  Oriented to Self Alcohol / Substance use:  Not Applicable Psych involvement (Current and /or in the community):  No (Comment)  Discharge Needs  Concerns to be addressed:  Discharge Planning Concerns Readmission within the last 30 days:  No Current discharge risk:  Physical Impairment Barriers to Discharge:  [place], Wende Crease, LCSW 04/20/2015, 3:55 PM [phone]

## 2015-04-20 NOTE — Clinical Social Work Placement (Signed)
   CLINICAL SOCIAL WORK PLACEMENT  NOTE  Date:  04/20/2015  Patient Details  Name: Ariana [name] MRN: [id number] Date of Birth: [birth year]  Clinical Social Work is seeking post-discharge placement for this patient at the [place] level of care (*CSW will initial, date and re-position this form in  chart as items are completed):  Yes   Patient/family provided with [place] Work Department's list of facilities offering this level of care within the geographic area requested by the patient (or if unable, by the patient's family).  Yes   Patient/family informed of their freedom to choose among providers that offer the needed level of care, that participate in Medicare, Medicaid or managed care program needed by the patient, have an available bed and are willing to accept the patient.  Yes   Patient/family informed of [facility]'s ownership interest in [facility] and [facility], as well as of the fact that they are under no obligation to receive care at these facilities.  PASRR submitted to EDS on       PASRR number received on       Existing PASRR number confirmed on 04/20/15     FL2 transmitted to all facilities in geographic area requested by pt/family on 04/20/15     FL2 transmitted to all facilities within larger geographic area on       Patient informed that his/her managed care company has contracts with or will negotiate with certain facilities, including the following:        Yes   Patient/family informed of bed offers received.  Patient chooses bed at [facility]     Physician recommends and patient chooses bed at      Patient to be transferred to [facility] on 04/20/15.  Patient to be transferred to facility by ambulance Corey Harold)     Patient family notified on 04/20/15 of transfer.  Name of family member notified:  pt notified at bedside and pt daughter, Aaditri notified via telephone     PHYSICIAN Please sign FL2      Additional Comment:    _______________________________________________ Ladell Pier, LCSW 04/20/2015, 5:53 PM  [phone]

## 2015-04-20 NOTE — NC FL2 (Signed)
[place] LEVEL OF CARE SCREENING TOOL     IDENTIFICATION  Patient Name: Ariana [name] Birthdate: 01/16/1930 Sex: female Admission Date (Current Location): 04/18/2015  [employer] and [place] Number:  Herbalist and Address:  [facility],  [place] [address], [place]      Provider Number: [id number]  Attending Physician Name and Address:  Orson Eva, MD  Relative Name and Phone Number:       Current Level of Care: Hospital Recommended Level of Care: [place] Prior Approval Number:    Date Approved/Denied:   PASRR Number: [id number]  Discharge Plan: SNF    Current Diagnoses: Patient Active Problem List   Diagnosis Date Noted  . Paroxysmal atrial fibrillation ([place]) 04/18/2015  . Dehydration 04/18/2015  . Dementia without behavioral disturbance 04/18/2015  . TIA (transient ischemic attack) 12/17/2014  . Paroxysmal a-fib ([place]) 12/16/2014  . Chest pain 12/16/2014  . Acute encephalopathy 12/04/2014  . Fall   . AKI (acute kidney injury) ([place]) 12/03/2014  . History of cerebrovascular disease 09/24/2014  . Falls frequently 07/28/2014  . Infarction of parietal lobe ([place])   . Mild dementia   . CVA (cerebral infarction) 07/26/2014  . Atrial fibrillation with RVR ([place]) 07/03/2014  . Constipation 04/15/2014  . Mixed stress and urge urinary incontinence 11/03/2013  . Therapeutic opioid induced constipation 11/03/2013  . Hemorrhoid 11/03/2013  . Low back pain associated with a spinal disorder other than radiculopathy or spinal stenosis 11/03/2013  . Protein-calorie malnutrition, severe ([place]) 07/22/2013  . UTI (lower urinary tract infection) 07/20/2013  . Prolonged QT interval 07/20/2013  . Osteopenia 07/17/2013  . Palpitations 06/30/2013  . Hereditary and idiopathic peripheral neuropathy 05/22/2013  . Abnormality of gait 12/05/2012  . Headache(784.0) 09/05/2012  . Multinodular thyroid 01/15/2012  . Neck pain  01/15/2012  . Senile dementia 07/14/2011  . Hypercholesterolemia 07/08/2010  . DRY EYE SYNDROME 07/06/2009  . [place] SYNDROME 07/06/2009  . Urge urinary incontinence 01/06/2009  . COLONIC POLYPS, ADENOMATOUS, HX OF 04/15/2008  . MITRAL VALVE PROLAPSE 11/05/2007  . ANXIETY DEPRESSION 07/02/2007  . Mononeuritis 07/02/2007  . HYPERTENSION, BENIGN 07/02/2007  . GERD 07/02/2007  . Irritable bowel syndrome 07/02/2007  . Fibromyalgia 07/02/2007    Orientation RESPIRATION BLADDER Height & Weight     Self, Time, Situation, Place  Normal Continent Weight: 147 lb (66.679 kg) Height:  5\' 3"  (160 cm)  BEHAVIORAL SYMPTOMS/MOOD NEUROLOGICAL BOWEL NUTRITION STATUS   (no behaviors)  (NONE) Incontinent Diet (Diet Soft)  AMBULATORY STATUS COMMUNICATION OF NEEDS Skin   Extensive Assist Verbally Normal                       Personal Care Assistance Level of Assistance  Bathing, Feeding, Dressing Bathing Assistance: Maximum assistance Feeding assistance: Independent Dressing Assistance: Maximum assistance     Functional Limitations Info  Sight, Hearing, Speech Sight Info: Adequate Hearing Info: Adequate Speech Info: Adequate    SPECIAL CARE FACTORS FREQUENCY  PT (By licensed PT), OT (By licensed OT)     PT Frequency: 5 x a week OT Frequency: 5 x a week            Contractures Contractures Info: Not present    Additional Factors Info  Code Status, Allergies Code Status Info: FULL code status Allergies Info: Banana, Codeine, Klonopin, Meperidine Hcl, Norflex, Oxycodone-acetaminophen, Propoxyphene Hcl, Zoloft, Doxycycline, Naproxen, Penicillins, Phenothiazines, Stelazine, Sulfamethoxazole-trimethoprim, Tolectin, Tramadol  Current Medications (04/20/2015):  This is the current hospital active medication list Current Facility-Administered Medications  Medication Dose Route Frequency Provider Last Rate Last Dose  . acetaminophen (TYLENOL) tablet 650 mg  650 mg Oral  Q6H PRN Orson Eva, MD   650 mg at 04/20/15 0522   Or  . acetaminophen (TYLENOL) suppository 650 mg  650 mg Rectal Q6H PRN Orson Eva, MD      . ALPRAZolam Duanne Moron) tablet 0.5 mg  0.5 mg Oral BID PRN Orson Eva, MD   0.5 mg at 04/19/15 2218  . aspirin tablet 325 mg  325 mg Oral Daily Orson Eva, MD   325 mg at 04/20/15 0951  . cefTRIAXone (ROCEPHIN) 1 g in dextrose 5 % 50 mL IVPB  1 g Intravenous Q24H Orson Eva, MD   1 g at 04/20/15 0802  . docusate sodium (COLACE) capsule 100 mg  100 mg Oral BID Orson Eva, MD   100 mg at 04/20/15 0951  . DULoxetine (CYMBALTA) DR capsule 60 mg  60 mg Oral Daily Orson Eva, MD   60 mg at 04/20/15 0953  . enoxaparin (LOVENOX) injection 40 mg  40 mg Subcutaneous QHS Orson Eva, MD   40 mg at 04/19/15 2220  . hydroxychloroquine (PLAQUENIL) tablet 200 mg  200 mg Oral 2 times per day on Mon Tue Wed Thu Fri Orson Eva, MD   200 mg at 04/20/15 [id number]  . iohexol (OMNIPAQUE) 300 MG/ML solution 50 mL  50 mL Oral Once PRN Olivia Canter Sam, PA-C   50 mL at 04/18/15 1630  . lactose free nutrition (BOOST PLUS) liquid 237 mL  237 mL Oral BID BM Orson Eva, MD   237 mL at 04/20/15 1007  . lubiprostone (AMITIZA) capsule 24 mcg  24 mcg Oral BID WC Orson Eva, MD   24 mcg at 04/20/15 0802  . memantine (NAMENDA) tablet 10 mg  10 mg Oral BID Orson Eva, MD   10 mg at 04/20/15 [id number]  . metoprolol (LOPRESSOR) tablet 50 mg  50 mg Oral BID Orson Eva, MD   50 mg at 04/20/15 0953  . mirabegron ER (MYRBETRIQ) tablet 25 mg  25 mg Oral Daily Orson Eva, MD   25 mg at 04/20/15 0952  . ondansetron (ZOFRAN) tablet 4 mg  4 mg Oral Q6H PRN Orson Eva, MD       Or  . ondansetron [facility]) injection 4 mg  4 mg Intravenous Q6H PRN Orson Eva, MD      . pantoprazole (PROTONIX) EC tablet 40 mg  40 mg Oral Daily Orson Eva, MD   40 mg at 04/20/15 0952  . pilocarpine (SALAGEN) tablet 5 mg  5 mg Oral BID Orson Eva, MD   5 mg at 04/20/15 [id number]  . polyvinyl alcohol (LIQUIFILM TEARS) 1.4 % ophthalmic solution 1 drop  1 drop Both  Eyes BID PRN Orson Eva, MD      . pravastatin (PRAVACHOL) tablet 20 mg  20 mg Oral q1800 Orson Eva, MD   20 mg at 04/19/15 1759  . pregabalin (LYRICA) capsule 75 mg  75 mg Oral BID Orson Eva, MD   75 mg at 04/20/15 [id number]  . senna (SENOKOT) tablet 17.2 mg  2 tablet Oral Daily Orson Eva, MD   17.2 mg at 04/20/15 0951  . sodium chloride (OCEAN) 0.65 % nasal spray 1 spray  1 spray Nasal PRN Orson Eva, MD         Discharge Medications: Please see discharge summary for  a list of discharge medications.  Relevant Imaging Results:  Relevant Lab Results:   Additional Information SSN: [id number]  KIDD, Hughes Better A, LCSW

## 2015-04-21 ENCOUNTER — Encounter: Payer: Self-pay | Admitting: Internal Medicine

## 2015-04-21 ENCOUNTER — Non-Acute Institutional Stay (SKILLED_NURSING_FACILITY): Payer: Medicare Other | Admitting: Internal Medicine

## 2015-04-21 DIAGNOSIS — K219 Gastro-esophageal reflux disease without esophagitis: Secondary | ICD-10-CM | POA: Diagnosis not present

## 2015-04-21 DIAGNOSIS — M3500 Sicca syndrome, unspecified: Secondary | ICD-10-CM

## 2015-04-21 DIAGNOSIS — F341 Dysthymic disorder: Secondary | ICD-10-CM

## 2015-04-21 DIAGNOSIS — F0390 Unspecified dementia without behavioral disturbance: Secondary | ICD-10-CM | POA: Diagnosis not present

## 2015-04-21 DIAGNOSIS — T402X5A Adverse effect of other opioids, initial encounter: Secondary | ICD-10-CM

## 2015-04-21 DIAGNOSIS — J309 Allergic rhinitis, unspecified: Secondary | ICD-10-CM

## 2015-04-21 DIAGNOSIS — M549 Dorsalgia, unspecified: Secondary | ICD-10-CM | POA: Diagnosis not present

## 2015-04-21 DIAGNOSIS — I480 Paroxysmal atrial fibrillation: Secondary | ICD-10-CM

## 2015-04-21 DIAGNOSIS — K5903 Drug induced constipation: Secondary | ICD-10-CM

## 2015-04-21 DIAGNOSIS — G6289 Other specified polyneuropathies: Secondary | ICD-10-CM | POA: Diagnosis not present

## 2015-04-21 DIAGNOSIS — R5381 Other malaise: Secondary | ICD-10-CM | POA: Diagnosis not present

## 2015-04-21 DIAGNOSIS — N390 Urinary tract infection, site not specified: Secondary | ICD-10-CM

## 2015-04-21 DIAGNOSIS — G8929 Other chronic pain: Secondary | ICD-10-CM

## 2015-04-21 DIAGNOSIS — R1310 Dysphagia, unspecified: Secondary | ICD-10-CM

## 2015-04-21 DIAGNOSIS — R682 Dry mouth, unspecified: Secondary | ICD-10-CM

## 2015-04-21 LAB — URINE CULTURE

## 2015-04-21 NOTE — Progress Notes (Signed)
LOCATION: [place]  PCP: Hollace Kinnier, DO   Code Status: Full Code  Goals of care: Advanced Directive information Advanced Directives 04/18/2015  Does patient have an advance directive? No  Type of Advance Directive -  Copy of advanced directive(s) in chart? -  Would patient like information on creating an advanced directive? No - patient declined information     Extended Emergency Contact Information Primary Emergency Contact: Algie Coffer Address: [address]          [place], [place] [address] Johnnette Litter of [place] Phone: [phone] Mobile Phone: [phone] Relation: Daughter   Allergies  Allergen Reactions  . Banana Nausea And Vomiting  . Codeine Nausea Only    unless given with Phenergan  . Klonopin [Clonazepam] Other (See Comments)    Causes hallucination   . Meperidine Hcl Nausea Only    unless given with Phenergan  . Norflex [Orphenadrine Citrate] Nausea Only    Unless given with Phenergan  . Oxycodone-Acetaminophen Nausea Only    unless given with phenergan  . Propoxyphene Hcl Nausea Only    unless given with phenergan  . Zoloft [Sertraline Hcl] Other (See Comments)    Caused lethargy  . Doxycycline Other (See Comments)    Unknown  . Naproxen Other (See Comments)  . Penicillins Other (See Comments)    Unknown  . Phenothiazines Other (See Comments)    Unknown  . Stelazine Other (See Comments)    Unknown  . Sulfamethoxazole-Trimethoprim Other (See Comments)    Unknown  . Tolectin [Tolmetin Sodium] Other (See Comments)    Unknown  . Tramadol Other (See Comments)    Unknown    Chief Complaint  Patient presents with  . New Admit To SNF    New Admission     HPI:  Patient is a 82 y.o. female seen today for short term rehabilitation post hospital admission from 04/18/15-04/20/15 with acute encephalopathy from dehydration and UTI. She received iv fluids and antibiotics. Her cardizem was held in hospital for  bradycardia. She has PMH of dementia, afib, HLD, peripheral neuropathy among others. She is seen in her room today. She complaints of some nausea this am and has discomfort with urination. No other concerns.   Review of Systems:  Constitutional: Negative for fever, chills,and diaphoresis. Positive for malaise.  HENT: Negative for headache,nasal discharge, hearing loss, sore throat. Positive for nasal congestion, dry mouth and difficulty swallowing.  Eyes: Negative for double vision and discharge.  Respiratory: Negative for cough, shortness of breath and wheezing.   Cardiovascular: Negative for chest pain, palpitations, leg swelling.  Gastrointestinal: Negative for vomiting,loss of appetite,melena, abdominal pain, diarrhea and constipation. Positive for occassional heartburn and nausea. Last bowel movement was yesterday. Genitourinary: Negative for flank pain. Positive for dysuria. Burning and pain present with urination.  Musculoskeletal: Negative for fall. Positive for chronic back pain Skin: Negative for itching, rash.  Neurological: Negative for dizziness. Positive for generalized weakness Psychiatric/Behavioral: Negative for depression,  Insomnia. Positive for anxiety and memory loss.   Past Medical History  Diagnosis Date  . Sjogren's syndrome ([place])   . Dry eye syndrome   . Hypertension, benign   . Mitral valve prolapse   . GERD (gastroesophageal reflux disease)   . Diverticulosis of colon   . Irritable bowel syndrome   . Urinary incontinence   . Low back pain syndrome   . Fibromyalgia   . Memory loss   . Anxiety and depression   . History of adverse  drug reaction   . Peripheral neuropathy ([facility])     "both feet and legs"  . Shortness of breath 07/18/11    "alot lately"  . Anginal pain ([place])   . History of recurrent UTIs   . H/O hiatal hernia   . Anxiety   . Dementia   . Depression   . Abnormality of gait   . Thyroid nodule   . Headache(784.0) 09/05/2012  . Adenomatous  polyp of colon 2002    48mm  . History of cerebrovascular disease 09/24/2014   Past Surgical History  Procedure Laterality Date  . Vesicovaginal fistula closure w/ tah    . Appendectomy    . Cholecystectomy  2000  . Mandible surgery    . Temporomandibular joint surgery  1986    Dr. Terence Lux  . Cataract extraction, bilateral    . Abdominal hysterectomy  1967  . Dental surgery      multiple tooth extractions  . Esophagogastroduodenoscopy (egd) with esophageal dilation N/A 08/23/2012    Procedure: ESOPHAGOGASTRODUODENOSCOPY (EGD) WITH ESOPHAGEAL DILATION;  Surgeon: Milus Banister, MD;  Location: [facility];  Service: Endoscopy;  Laterality: N/A;  . Colonoscopy w/ biopsies      multiple  . Nasal septum surgery  1980  . Transthoracic echocardiogram  2001    mild LVH, normal LV  . Nm myocar perf wall motion  2003    persantine - normal static and dynamic study w/apical thinning and presvered LV function, no ischemia  . Cardiac catheterization  02/17/2003    normal L main, LAD free of disease, Cfx free of disease, RCA free of disease (Dr. Rockne Menghini)   Social History:   reports that she has quit smoking. She has never used smokeless tobacco. She reports that she does not drink alcohol or use illicit drugs.  Family History  Problem Relation Age of Onset  . Heart disease Father     heart attack  . Pneumonia Mother   . Heart attack Mother   . Hypertension Mother   . Hypertension Maternal Grandmother   . Colon cancer Sister   . Kidney disease Daughter   . Asthma Daughter   . Arthritis Daughter 54    osteo,  . Heart disease Son 22    stage 3 CHF(Diastolic /Systolic)  . Throat cancer Brother     Medications:   Medication List       This list is accurate as of: 04/21/15  1:13 PM.  Always use your most recent med list.               ALPRAZolam 0.5 MG tablet  Commonly known as:  XANAX  Take 1 tablet (0.5 mg total) by mouth 2 (two) times daily as needed for anxiety.      aspirin 325 MG tablet  Take 1 tablet (325 mg total) by mouth daily.     bisacodyl 5 MG EC tablet  Commonly known as:  DULCOLAX  Take 10 mg by mouth daily as needed for moderate constipation.     Calcium Carbonate 500 MG Chew  Chew 500 mg by mouth daily.     cefdinir 300 MG capsule  Commonly known as:  OMNICEF  Take 1 capsule (300 mg total) by mouth 2 (two) times daily.     cetirizine 10 MG tablet  Commonly known as:  ZYRTEC  Take 1 tablet (10 mg total) by mouth daily.     Cranberry 400 MG Tabs  Take 400 mg by mouth daily.  docusate sodium 100 MG capsule  Commonly known as:  COLACE  Take 100 mg by mouth daily as needed for mild constipation.     DULoxetine 60 MG capsule  Commonly known as:  CYMBALTA  Take one tablet by mouth in the evening once daily for depression     DULoxetine 30 MG capsule  Commonly known as:  CYMBALTA  Take one tablet  once daily in the morning for depression     HYDROcodone-acetaminophen 5-325 MG tablet  Commonly known as:  NORCO/VICODIN  Take 1 tablet by mouth 2 (two) times daily.     HYDROcodone-acetaminophen 5-325 MG tablet  Commonly known as:  NORCO/VICODIN  Take 1 tablet by mouth every 6 (six) hours as needed for moderate pain. Take one tablet twice daily for pain     hydroxychloroquine 200 MG tablet  Commonly known as:  PLAQUENIL  Take 200 mg by mouth. Take one tablet twice daily Monday thru Friday, only     lansoprazole 30 MG capsule  Commonly known as:  PREVACID  TAKE ONE CAPSULE BY MOUTH EVERY DAY AT 12 NOON     lubiprostone 24 MCG capsule  Commonly known as:  AMITIZA  Take 1 capsule (24 mcg total) by mouth 2 (two) times daily with a meal.     LYRICA 75 MG capsule  Generic drug:  pregabalin  TAKE ONE CAPSULE BY MOUTH TWICE A DAY     memantine 10 MG tablet  Commonly known as:  NAMENDA  Take 1 tablet (10 mg total) by mouth 2 (two) times daily.     metoprolol 50 MG tablet  Commonly known as:  LOPRESSOR  Take 1 tablet (50 mg  total) by mouth 2 (two) times daily.     mirabegron ER 25 MG Tb24 tablet  Commonly known as:  MYRBETRIQ  Take 1 tablet (25 mg total) by mouth daily.     pilocarpine 5 MG tablet  Commonly known as:  SALAGEN  Take 5 mg by mouth 2 (two) times daily.     pravastatin 20 MG tablet  Commonly known as:  PRAVACHOL  Take 1 tablet (20 mg total) by mouth daily at 6 PM.     REFRESH TEARS 0.5 % Soln  Generic drug:  carboxymethylcellulose  Place 1 drop into both eyes 2 (two) times daily as needed (dry eyes).     senna 8.6 MG Tabs tablet  Commonly known as:  SENOKOT  Take 2 tablets (17.2 mg total) by mouth daily.     sodium chloride 0.65 % Soln nasal spray  Commonly known as:  OCEAN  Place 1 spray into the nose as needed for congestion.        Immunizations: Immunization History  Administered Date(s) Administered  . Influenza Split 12/26/2010, 01/15/2012  . Influenza Whole 12/30/2008, 11/15/2009  . Influenza,inj,Quad PF,36+ Mos 02/04/2013, 11/03/2013, 12/05/2014  . Pneumococcal Conjugate-13 06/12/2014  . Pneumococcal Polysaccharide-23 02/15/2009, 12/05/2014  . Tdap 12/08/2013  . Zoster 01/29/2012     Physical Exam: Filed Vitals:   04/21/15 1250  BP: 169/92  Pulse: 67  Temp: 97.3 F (36.3 C)  TempSrc: Oral  Resp: 20  Height: 5\' 3"  (1.6 m)  Weight: 147 lb (66.679 kg)  SpO2: 97%   Body mass index is 26.05 kg/(m^2).  General- elderly female, well built, in no acute distress Head- normocephalic, atraumatic Nose- no maxillary or frontal sinus tenderness, no nasal discharge Throat- moist mucus membrane, adentulous and has dentures  Eyes- PERRLA, EOMI, no pallor, no  icterus, no discharge, normal conjunctiva, normal sclera Neck- no cervical lymphadenopathy Cardiovascular- normal s1,s2, no murmur, no leg edema Respiratory- bilateral clear to auscultation, no wheeze, no rhonchi, no crackles, no use of accessory muscles Abdomen- bowel sounds present, soft, distended, non  tender Musculoskeletal- able to move all 4 extremities, generalized weakness Neurological- alert and oriented to person and place but not to time  Skin- warm and dry Psychiatry- anxiety noted    Labs reviewed: Basic Metabolic Panel:  Recent Labs  12/17/14 0215 04/18/15 1535 04/20/15 04/20/15 0332  NA 140 140 143 143  K 3.9 4.1  --  3.5  CL 103 103  --  110  CO2 27 28  --  26  GLUCOSE 89 134*  --  134*  BUN 12 18 13 13   CREATININE 0.70 0.68 0.6 0.65  CALCIUM 9.3 10.0  --  9.2   Liver Function Tests:  Recent Labs  12/08/14 0630 12/16/14 1223 04/18/15 1535  AST 32 24 24  ALT 29 20 19   ALKPHOS 86 74 93  BILITOT 0.6 0.3 0.6  PROT 6.4* 6.0* 7.7  ALBUMIN 3.5 3.3* 4.3    Recent Labs  12/07/14 1409  LIPASE 36    Recent Labs  12/04/14 0815  AMMONIA 28   CBC:  Recent Labs  12/10/14 1613 12/16/14 1223 12/16/14 1224 12/17/14 0215 04/18/15 1535  WBC 5.1 5.1  --  4.3 7.5  NEUTROABS 2.7 2.5  --   --  5.6  HGB  --  11.0* 11.9* 11.6* 13.6  HCT 35.2 33.6* 35.0* 35.5* 42.4  MCV 88 90.8  --  90.3 92.8  PLT 241 225  --  241 205   Cardiac Enzymes:  Recent Labs  12/03/14 1613 12/16/14 1710 12/16/14 2315  CKTOTAL 287*  --   --   TROPONINI  --  <0.03 <0.03   BNP: Invalid input(s): POCBNP CBG:  Recent Labs  12/16/14 1213  GLUCAP 110*    Radiological Exams: Dg Chest 2 View  04/18/2015  CLINICAL DATA:  Weakness since yesterday.  Hypertension. EXAM: CHEST  2 VIEW COMPARISON:  03/01/2015 FINDINGS: Mild cardiomegaly is stable. Mild scarring in right lung base is stable. No evidence of pulmonary edema or consolidation. No evidence pleural effusion or pneumothorax. IMPRESSION: Mild cardiomegaly.  No active lung disease. Electronically Signed   By: Earle Gell M.D.   On: 04/18/2015 15:40   Ct Head Wo Contrast  04/19/2015  CLINICAL DATA:  Acute encephalopathy. EXAM: CT HEAD WITHOUT CONTRAST TECHNIQUE: Contiguous axial images were obtained from the base of the  skull through the vertex without intravenous contrast. COMPARISON:  Head CT 12/16/2014.  Brain MRI in 11/2019 16 FINDINGS: Generalized cerebral and cerebellar atrophy. Age related chronic small vessel ischemia. Encephalomalacia in the right frontal and parietal lobes consistent with remote infarcts.No intracranial hemorrhage, mass effect, or midline shift. No hydrocephalus. The basilar cisterns are patent. No evidence of territorial infarct. No intracranial fluid collection. Calvarium is intact. Chronic deformity of right maxillary sinus and postsurgical change in the zygomatic arch. No paranasal sinus mucosal thickening. Mastoid air cells are well aerated. IMPRESSION: Stable atrophy, chronic small vessel ischemia, and remote infarcts. No CT findings of superimposed acute abnormality. Electronically Signed   By: Jeb Levering M.D.   On: 04/19/2015 01:27   Ct Abdomen Pelvis W Contrast  04/18/2015  CLINICAL DATA:  Weakness for 2 days. EXAM: CT ABDOMEN AND PELVIS WITH CONTRAST TECHNIQUE: Multidetector CT imaging of the abdomen and pelvis was performed using  the standard protocol following bolus administration of intravenous contrast. CONTRAST:  26mL OMNIPAQUE IOHEXOL 300 MG/ML SOLN, 168mL OMNIPAQUE IOHEXOL 300 MG/ML SOLN COMPARISON:  12/07/2014 FINDINGS: Lower chest:  No acute findings. Hepatobiliary: No focal liver abnormality. Previous cholecystectomy. Fusiform dilatation of the common bile duct measures up to 8 mm. Pancreas: No mass, inflammatory changes, or other significant abnormality. Spleen: Within normal limits in size and appearance. Adrenals/Urinary Tract: Intermediate attenuating cyst arising from the mid left kidney has increased in size from previous exam and now measures 1.7 cm and 29 HU. On the previous exam this structure measured 0.8 cm. No obstructive uropathy. The urinary bladder appears within normal limits. Stomach/Bowel: The stomach is normal. The small bowel loops have at normal course and  caliber. No obstruction. No pathologic dilatation of the colon. Vascular/Lymphatic: No pathologically enlarged lymph nodes. No evidence of abdominal aortic aneurysm. Reproductive: No mass or other significant abnormality. Other: No free fluid or fluid collections. Small periumbilical hernia contains fat only. Musculoskeletal: No aggressive lytic or sclerotic bone lesions. Degenerative disc disease is noted at the L5-S1 level. IMPRESSION: 1. No acute findings identified within the abdomen or pelvis. No explanation for patient's weakness. 2. Increase in size of intermediate attenuating lesion within the left kidney. Nonspecific. Recommend further investigation with nonemergent renal mass protocol MRI or CT. 3. Increase caliber of the CBD status post cholecystectomy. 4. Lumbar degenerative disc disease. Electronically Signed   By: Kerby Moors M.D.   On: 04/18/2015 17:03    Assessment/Plan  Physical deconditioning Will have her work with physical therapy and occupational therapy team to help with gait training and muscle strengthening exercises.fall precautions. Skin care. Encourage to be out of bed.   Urinary tract infection Continue cefdinir 300 mg bid until 04/25/15. Encourage hydration. Continue cranberry supplement.   afib Rate controlled. Continue metoprolol tartrate 50 mg bid for rate control. Continue aspirin ec 325 mg daily for anticoagulation.  Allergic rhinitis Has Nasal congestion. Continue cetrizine 10 mg daily  Dry mouth  Likely from her Sjogren's syndrome. Continue pilocarpine 5 mg bid and monitor  Dysphagia Her sjogren could be contributing to this. Will have SLP to evaluate her further with her complaints and her history of dementia. Aspiration precautions  Anxiety Continue xanax 0.5 mg bid prn for now and monitor  GERD Continue prevacid 30 mg daily but change this to 6 am to help better with her symptoms  Chronic back pain Continue norco 5-325 mg bid with norco 5-325 mg  q6h prn pain  Constipation Continue senna s 2 tab daily with amitiza 24 mcg bid. Continue bisacodyl EC 5 mg 2 tab prn, colace 100 mg daily prn  sjogren's syndrome Continue plaquenil and pilocarpine and provide symptomatic treatment for now. SLP to work with her.   Chronic depression Stable mood. Continue cymbalta 30 mg in am and 60 mg in pm  Peripheral neuropathy Continue lyrica 75 mg bid and monitor. Fall precautions  Dementia No behavioral disturbance. Continue namenda 10 mg bid. Continue assistance with ADLs. Fall precautions.   Goals of care: short term rehabilitation   Labs/tests ordered: cbc, bmp 1 week  Family/ staff Communication: reviewed care plan with patient and nursing supervisor    Blanchie Serve, MD Internal Medicine [place], [place] [address] Cell Phone (Monday-Friday 8 am - 5 pm): [phone] On Call: [phone] and follow prompts after 5 pm and on weekends Office Phone: [phone] Office Fax: [phone]

## 2015-04-28 DIAGNOSIS — I10 Essential (primary) hypertension: Secondary | ICD-10-CM | POA: Diagnosis not present

## 2015-04-28 DIAGNOSIS — D509 Iron deficiency anemia, unspecified: Secondary | ICD-10-CM | POA: Diagnosis not present

## 2015-04-28 LAB — CBC AND DIFFERENTIAL
HCT: 38 % (ref 36–46)
HEMOGLOBIN: 12.2 g/dL (ref 12.0–16.0)
Platelets: 214 10*3/uL (ref 150–399)
WBC: 4.5 10*3/mL

## 2015-04-28 LAB — BASIC METABOLIC PANEL
BUN: 13 mg/dL (ref 4–21)
Creatinine: 0.7 mg/dL (ref 0.5–1.1)
Glucose: 100 mg/dL
POTASSIUM: 3.6 mmol/L (ref 3.4–5.3)
Sodium: 145 mmol/L (ref 137–147)

## 2015-05-03 DIAGNOSIS — N390 Urinary tract infection, site not specified: Secondary | ICD-10-CM | POA: Diagnosis not present

## 2015-05-05 ENCOUNTER — Encounter: Payer: Self-pay | Admitting: Adult Health

## 2015-05-05 DIAGNOSIS — F419 Anxiety disorder, unspecified: Secondary | ICD-10-CM | POA: Diagnosis not present

## 2015-05-05 DIAGNOSIS — F0390 Unspecified dementia without behavioral disturbance: Secondary | ICD-10-CM | POA: Diagnosis not present

## 2015-05-05 DIAGNOSIS — F329 Major depressive disorder, single episode, unspecified: Secondary | ICD-10-CM | POA: Diagnosis not present

## 2015-05-05 NOTE — Progress Notes (Signed)
Patient ID: Ariana [name], female   DOB: May 02, 1929, 82 y.o.   MRN: [id number]

## 2015-05-10 DIAGNOSIS — I480 Paroxysmal atrial fibrillation: Secondary | ICD-10-CM | POA: Diagnosis not present

## 2015-05-10 DIAGNOSIS — G8929 Other chronic pain: Secondary | ICD-10-CM | POA: Diagnosis not present

## 2015-05-10 DIAGNOSIS — F0390 Unspecified dementia without behavioral disturbance: Secondary | ICD-10-CM | POA: Diagnosis not present

## 2015-05-10 DIAGNOSIS — G629 Polyneuropathy, unspecified: Secondary | ICD-10-CM | POA: Diagnosis not present

## 2015-05-10 DIAGNOSIS — M6281 Muscle weakness (generalized): Secondary | ICD-10-CM | POA: Diagnosis not present

## 2015-05-10 DIAGNOSIS — M797 Fibromyalgia: Secondary | ICD-10-CM | POA: Diagnosis not present

## 2015-05-12 ENCOUNTER — Telehealth: Payer: Self-pay

## 2015-05-12 NOTE — Telephone Encounter (Signed)
[facility]- Physical Therapist called requesting verbal order for therapy to continue 2 x weekly for 6 weeks. Order was given per standing order at [facility]

## 2015-05-20 ENCOUNTER — Telehealth: Payer: Self-pay | Admitting: *Deleted

## 2015-05-20 NOTE — Telephone Encounter (Signed)
Spoke with daughter and they can not get patient in today, daughter states her husband went back to work and they wouldn't have a ride here. They would like to keep the appt tomorrow. She also states that the physical therapist is there now helping to give patient a bath.

## 2015-05-20 NOTE — Telephone Encounter (Signed)
Really does need to come for vitals and urine sample at least.  Ideally other labs, too.

## 2015-05-20 NOTE — Telephone Encounter (Signed)
Pt's daughter calling stating that pt is not being herself today, she's trying to call the police, being verbally abusive and just "acting up" per daughter this is usually when she has a UTI. I tried to schedule appt with [name] at 9:30 but per daughter the caregiver doesn't come until 9am so appt was scheduled with Gildardo Cranker.

## 2015-05-20 NOTE — Telephone Encounter (Signed)
If pt has a change in MS she needs to be seen today and NOT tomorrow as she may worsen overnight. Please s/w Dr Mariea Clonts about this.

## 2015-05-20 NOTE — Telephone Encounter (Signed)
Per daughter they are having diffuculty getting pt here because she is not cooperating.

## 2015-05-21 ENCOUNTER — Ambulatory Visit: Payer: Medicare Other | Admitting: Internal Medicine

## 2015-05-21 ENCOUNTER — Other Ambulatory Visit (INDEPENDENT_AMBULATORY_CARE_PROVIDER_SITE_OTHER): Payer: Medicare Other

## 2015-05-21 ENCOUNTER — Telehealth: Payer: Self-pay | Admitting: *Deleted

## 2015-05-21 ENCOUNTER — Other Ambulatory Visit: Payer: Medicare Other

## 2015-05-21 DIAGNOSIS — N390 Urinary tract infection, site not specified: Secondary | ICD-10-CM | POA: Diagnosis not present

## 2015-05-21 LAB — POCT URINALYSIS DIPSTICK
BILIRUBIN UA: NEGATIVE
Blood, UA: NEGATIVE
GLUCOSE UA: NEGATIVE
Ketones, UA: NEGATIVE
SPEC GRAV UA: 1.01
UROBILINOGEN UA: NEGATIVE
pH, UA: 6

## 2015-05-21 MED ORDER — CIPROFLOXACIN HCL 250 MG PO TABS: 250.0000 mg | ORAL_TABLET | Freq: Two times a day (BID) | ORAL | Status: DC

## 2015-05-21 MED ORDER — SACCHAROMYCES BOULARDII 250 MG PO CAPS: ORAL_CAPSULE | ORAL | Status: DC

## 2015-05-21 NOTE — Telephone Encounter (Signed)
Called patient's daughter to inform her that her mother did have a UTI and a prescription has been sent to the pharmacy for pickup.

## 2015-05-23 LAB — URINE CULTURE

## 2015-05-25 ENCOUNTER — Emergency Department (HOSPITAL_BASED_OUTPATIENT_CLINIC_OR_DEPARTMENT_OTHER): Payer: Medicare Other

## 2015-05-25 ENCOUNTER — Emergency Department (HOSPITAL_BASED_OUTPATIENT_CLINIC_OR_DEPARTMENT_OTHER)
Admission: EM | Admit: 2015-05-25 | Discharge: 2015-05-25 | Disposition: A | Discharge status: Home / Self Care | Payer: Medicare Other | Attending: Emergency Medicine | Admitting: Emergency Medicine

## 2015-05-25 ENCOUNTER — Encounter (HOSPITAL_BASED_OUTPATIENT_CLINIC_OR_DEPARTMENT_OTHER): Payer: Self-pay | Admitting: *Deleted

## 2015-05-25 DIAGNOSIS — S20212A Contusion of left front wall of thorax, initial encounter: Secondary | ICD-10-CM | POA: Diagnosis not present

## 2015-05-25 DIAGNOSIS — K219 Gastro-esophageal reflux disease without esophagitis: Secondary | ICD-10-CM | POA: Insufficient documentation

## 2015-05-25 DIAGNOSIS — Y9301 Activity, walking, marching and hiking: Secondary | ICD-10-CM | POA: Diagnosis not present

## 2015-05-25 DIAGNOSIS — Z8673 Personal history of transient ischemic attack (TIA), and cerebral infarction without residual deficits: Secondary | ICD-10-CM | POA: Insufficient documentation

## 2015-05-25 DIAGNOSIS — S20213A Contusion of bilateral front wall of thorax, initial encounter: Secondary | ICD-10-CM

## 2015-05-25 DIAGNOSIS — R0781 Pleurodynia: Secondary | ICD-10-CM | POA: Diagnosis not present

## 2015-05-25 DIAGNOSIS — S8992XA Unspecified injury of left lower leg, initial encounter: Secondary | ICD-10-CM | POA: Diagnosis not present

## 2015-05-25 DIAGNOSIS — I480 Paroxysmal atrial fibrillation: Secondary | ICD-10-CM | POA: Insufficient documentation

## 2015-05-25 DIAGNOSIS — Z880 Allergy status to penicillin: Secondary | ICD-10-CM | POA: Insufficient documentation

## 2015-05-25 DIAGNOSIS — W1839XA Other fall on same level, initial encounter: Secondary | ICD-10-CM | POA: Diagnosis not present

## 2015-05-25 DIAGNOSIS — I10 Essential (primary) hypertension: Secondary | ICD-10-CM | POA: Diagnosis not present

## 2015-05-25 DIAGNOSIS — Y998 Other external cause status: Secondary | ICD-10-CM | POA: Diagnosis not present

## 2015-05-25 DIAGNOSIS — Z9889 Other specified postprocedural states: Secondary | ICD-10-CM | POA: Insufficient documentation

## 2015-05-25 DIAGNOSIS — Z79899 Other long term (current) drug therapy: Secondary | ICD-10-CM | POA: Insufficient documentation

## 2015-05-25 DIAGNOSIS — S8392XA Sprain of unspecified site of left knee, initial encounter: Secondary | ICD-10-CM | POA: Insufficient documentation

## 2015-05-25 DIAGNOSIS — S20219A Contusion of unspecified front wall of thorax, initial encounter: Secondary | ICD-10-CM

## 2015-05-25 DIAGNOSIS — Z7951 Long term (current) use of inhaled steroids: Secondary | ICD-10-CM | POA: Insufficient documentation

## 2015-05-25 DIAGNOSIS — Z86010 Personal history of colonic polyps: Secondary | ICD-10-CM | POA: Diagnosis not present

## 2015-05-25 DIAGNOSIS — M797 Fibromyalgia: Secondary | ICD-10-CM | POA: Insufficient documentation

## 2015-05-25 DIAGNOSIS — F0390 Unspecified dementia without behavioral disturbance: Secondary | ICD-10-CM | POA: Diagnosis not present

## 2015-05-25 DIAGNOSIS — Z87891 Personal history of nicotine dependence: Secondary | ICD-10-CM | POA: Insufficient documentation

## 2015-05-25 DIAGNOSIS — Y92000 Kitchen of unspecified non-institutional (private) residence as  the place of occurrence of the external cause: Secondary | ICD-10-CM | POA: Insufficient documentation

## 2015-05-25 DIAGNOSIS — S20211A Contusion of right front wall of thorax, initial encounter: Secondary | ICD-10-CM | POA: Insufficient documentation

## 2015-05-25 DIAGNOSIS — F419 Anxiety disorder, unspecified: Secondary | ICD-10-CM | POA: Diagnosis not present

## 2015-05-25 DIAGNOSIS — G8929 Other chronic pain: Secondary | ICD-10-CM | POA: Diagnosis not present

## 2015-05-25 DIAGNOSIS — S299XXA Unspecified injury of thorax, initial encounter: Secondary | ICD-10-CM | POA: Diagnosis not present

## 2015-05-25 DIAGNOSIS — W19XXXA Unspecified fall, initial encounter: Secondary | ICD-10-CM

## 2015-05-25 DIAGNOSIS — Z87440 Personal history of urinary (tract) infections: Secondary | ICD-10-CM | POA: Insufficient documentation

## 2015-05-25 DIAGNOSIS — M25562 Pain in left knee: Secondary | ICD-10-CM | POA: Diagnosis not present

## 2015-05-25 DIAGNOSIS — F329 Major depressive disorder, single episode, unspecified: Secondary | ICD-10-CM | POA: Insufficient documentation

## 2015-05-25 DIAGNOSIS — S83402A Sprain of unspecified collateral ligament of left knee, initial encounter: Secondary | ICD-10-CM | POA: Diagnosis not present

## 2015-05-25 NOTE — ED Notes (Signed)
Patient assisted with getting dressed by caretaker. Patient uses walker but caretaker prefers wheelchair at discharge for patient transport.

## 2015-05-25 NOTE — ED Notes (Addendum)
She fell 3 days ago. Injury to her left knee and bilateral ribs. Pain to the back of her right upper leg. Pain to her left upper arm.

## 2015-05-25 NOTE — Discharge Instructions (Signed)
Ice your knee for 20 minutes every 2 hours while awake for the next 2 days.  Follow with your primary Dr. if not improving in the next week.  Magnesium citrate: Drink entire 10 ounce bottle mixed with equal parts Sprite or Gatorade for relief of constipation.   Rib Contusion A rib contusion is a deep bruise on your rib area. Contusions are the result of a blunt trauma that causes bleeding and injury to the tissues under the skin. A rib contusion may involve bruising of the ribs and of the skin and muscles in the area. The skin overlying the contusion may turn blue, purple, or yellow. Minor injuries will give you a painless contusion, but more severe contusions may stay painful and swollen for a few weeks. CAUSES  A contusion is usually caused by a blow, trauma, or direct force to an area of the body. This often occurs while playing contact sports. SYMPTOMS  Swelling and redness of the injured area.  Discoloration of the injured area.  Tenderness and soreness of the injured area.  Pain with or without movement. DIAGNOSIS  The diagnosis can be made by taking a medical history and performing a physical exam. An X-ray, CT scan, or MRI may be needed to determine if there were any associated injuries, such as broken bones (fractures) or internal injuries. TREATMENT  Often, the best treatment for a rib contusion is rest. Icing or applying cold compresses to the injured area may help reduce swelling and inflammation. Deep breathing exercises may be recommended to reduce the risk of partial lung collapse and pneumonia. Over-the-counter or prescription medicines may also be recommended for pain control. HOME CARE INSTRUCTIONS   Apply ice to the injured area:  Put ice in a plastic bag.  Place a towel between your skin and the bag.  Leave the ice on for 20 minutes, 2-3 times per day.  Take medicines only as directed by your health care provider.  Rest the injured area. Avoid strenuous  activity and any activities or movements that cause pain. Be careful during activities and avoid bumping the injured area.  Perform deep-breathing exercises as directed by your health care provider.  Do not lift anything that is heavier than 5 lb (2.3 kg) until your health care provider approves.  Do not use any tobacco products, including cigarettes, chewing tobacco, or electronic cigarettes. If you need help quitting, ask your health care provider. SEEK MEDICAL CARE IF:   You have increased bruising or swelling.  You have pain that is not controlled with treatment.  You have a fever. SEEK IMMEDIATE MEDICAL CARE IF:   You have difficulty breathing or shortness of breath.  You develop a continual cough, or you cough up thick or bloody sputum.  You feel sick to your stomach (nauseous), you throw up (vomit), or you have abdominal pain.   This information is not intended to replace advice given to you by your health care provider. Make sure you discuss any questions you have with your health care provider.   Document Released: 10/25/2000 Document Revised: 02/20/2014 Document Reviewed: 11/11/2013 Elsevier Interactive Patient Education 2016 [employer]  Knee Sprain A knee sprain is a tear in one of the strong, fibrous tissues that connect the bones (ligaments) in your knee. The severity of the sprain depends on how much of the ligament is torn. The tear can be either partial or complete. CAUSES  Often, sprains are a result of a fall or injury. The force of the  impact causes the fibers of your ligament to stretch too much. This excess tension causes the fibers of your ligament to tear. SIGNS AND SYMPTOMS  You may have some loss of motion in your knee. Other symptoms include:  Bruising.  Pain in the knee area.  Tenderness of the knee to the touch.  Swelling. DIAGNOSIS  To diagnose a knee sprain, your health care provider will physically examine your knee. Your health care  provider may also suggest an X-ray exam of your knee to make sure no bones are broken. TREATMENT  If your ligament is only partially torn, treatment usually involves keeping the knee in a fixed position (immobilization) or bracing your knee for activities that require movement for several weeks. To do this, your health care provider will apply a bandage, cast, or splint to keep your knee from moving and to support your knee during movement until it heals. For a partially torn ligament, the healing process usually takes 4-6 weeks. If your ligament is completely torn, depending on which ligament it is, you may need surgery to reconnect the ligament to the bone or reconstruct it. After surgery, a cast or splint may be applied and will need to stay on your knee for 4-6 weeks while your ligament heals. HOME CARE INSTRUCTIONS  Keep your injured knee elevated to decrease swelling.  To ease pain and swelling, apply ice to the injured area:  Put ice in a plastic bag.  Place a towel between your skin and the bag.  Leave the ice on for 20 minutes, 2-3 times a day.  Only take medicine for pain as directed by your health care provider.  Do not leave your knee unprotected until pain and stiffness go away (usually 4-6 weeks).  If you have a cast or splint, do not allow it to get wet. If you have been instructed not to remove it, cover it with a plastic bag when you shower or bathe. Do not swim.  Your health care provider may suggest exercises for you to do during your recovery to prevent or limit permanent weakness and stiffness. SEEK IMMEDIATE MEDICAL CARE IF:  Your cast or splint becomes damaged.  Your pain becomes worse.  You have significant pain, swelling, or numbness below the cast or splint. MAKE SURE YOU:  Understand these instructions.  Will watch your condition.  Will get help right away if you are not doing well or get worse.   This information is not intended to replace advice  given to you by your health care provider. Make sure you discuss any questions you have with your health care provider.   Document Released: 01/30/2005 Document Revised: 02/20/2014 Document Reviewed: 09/11/2012 Elsevier Interactive Patient Education [employer].

## 2015-05-25 NOTE — ED Provider Notes (Signed)
CSN: [id number]     Arrival date & time 05/25/15  1217 History   First MD Initiated Contact with Patient 05/25/15 1501     Chief Complaint  Patient presents with  . Fall     (Consider location/radiation/quality/duration/timing/severity/associated sxs/prior Treatment) HPI Comments: Patient is an 82 year old female with history of Sjogren's syndrome, hypertension, dementia. She presents for evaluation of a fall. She states that she walked into the kitchen 2 days ago and lost her balance and fell to the floor. She is complaining of pain in both sides of her chest and ribs and complaining of left knee pain. This is worse with breathing and ambulation. She denies shortness of breath.  Patient is a 82 y.o. female presenting with fall. The history is provided by the patient and a caregiver.  Fall This is a new problem. The current episode started 2 days ago. The problem occurs constantly. The problem has not changed since onset.Pertinent negatives include no chest pain and no abdominal pain. The symptoms are aggravated by walking. Nothing relieves the symptoms. She has tried nothing for the symptoms. The treatment provided no relief.    Past Medical History  Diagnosis Date  . Sjogren's syndrome ([place])   . Dry eye syndrome   . Hypertension, benign   . Mitral valve prolapse   . GERD (gastroesophageal reflux disease)   . Diverticulosis of colon   . Irritable bowel syndrome   . Urinary incontinence   . Low back pain syndrome   . Fibromyalgia   . Memory loss   . Anxiety and depression   . History of adverse drug reaction   . Peripheral neuropathy ([facility])     "both feet and legs"  . Shortness of breath 07/18/11    "alot lately"  . Anginal pain ([place])   . History of recurrent UTIs   . H/O hiatal hernia   . Anxiety   . Dementia without behavioral disturbance   . Depression   . Abnormality of gait   . Thyroid nodule   . Headache(784.0) 09/05/2012  . Adenomatous polyp of colon 2002    23mm  .  History of cerebrovascular disease 09/24/2014  . Physical deconditioning   . Paroxysmal a-fib ([place])   . Therapeutic opioid-induced constipation (OIC)   . Dysthymic disorder   . Chronic back pain   . Dysphagia   . Dry mouth   . Allergic rhinitis    Past Surgical History  Procedure Laterality Date  . Vesicovaginal fistula closure w/ tah    . Appendectomy    . Cholecystectomy  2000  . Mandible surgery    . Temporomandibular joint surgery  1986    Dr. Terence Lux  . Cataract extraction, bilateral    . Abdominal hysterectomy  1967  . Dental surgery      multiple tooth extractions  . Esophagogastroduodenoscopy (egd) with esophageal dilation N/A 08/23/2012    Procedure: ESOPHAGOGASTRODUODENOSCOPY (EGD) WITH ESOPHAGEAL DILATION;  Surgeon: Milus Banister, MD;  Location: [facility];  Service: Endoscopy;  Laterality: N/A;  . Colonoscopy w/ biopsies      multiple  . Nasal septum surgery  1980  . Transthoracic echocardiogram  2001    mild LVH, normal LV  . Nm myocar perf wall motion  2003    persantine - normal static and dynamic study w/apical thinning and presvered LV function, no ischemia  . Cardiac catheterization  02/17/2003    normal L main, LAD free of disease, Cfx free of disease, RCA free of  disease (Dr. Rockne Menghini)   Family History  Problem Relation Age of Onset  . Heart disease Father     heart attack  . Pneumonia Mother   . Heart attack Mother   . Hypertension Mother   . Hypertension Maternal Grandmother   . Colon cancer Sister   . Kidney disease Daughter   . Asthma Daughter   . Arthritis Daughter 70    osteo,  . Heart disease Son 58    stage 3 CHF(Diastolic /Systolic)  . Throat cancer Brother    Social History  Substance Use Topics  . Smoking status: Former Research scientist (life sciences)  . Smokeless tobacco: Never Used     Comment: Quit at age 26   . Alcohol Use: No   OB History    No data available     Review of Systems  Cardiovascular: Negative for chest pain.   Gastrointestinal: Negative for abdominal pain.  All other systems reviewed and are negative.     Allergies  Banana; Codeine; Klonopin; Meperidine hcl; Norflex; Oxycodone-acetaminophen; Propoxyphene hcl; Zoloft; Doxycycline; Naproxen; Penicillins; Phenothiazines; Stelazine; Sulfamethoxazole-trimethoprim; Tolectin; and Tramadol  Home Medications   Prior to Admission medications   Medication Sig Start Date End Date Taking? Authorizing Provider  ALPRAZolam Duanne Moron) 0.5 MG tablet Take 1 tablet (0.5 mg total) by mouth 2 (two) times daily as needed for anxiety. 04/20/15   Orson Eva, MD  aspirin 325 MG tablet Take 1 tablet (325 mg total) by mouth daily. 07/28/14   Reyne Dumas, MD  atorvastatin (LIPITOR) 10 MG tablet Take 10 mg by mouth daily.    Historical Provider, MD  bisacodyl (DULCOLAX) 5 MG EC tablet Take 10 mg by mouth daily as needed for moderate constipation.    Historical Provider, MD  calcium carbonate (TUMS - DOSED IN MG ELEMENTAL CALCIUM) 500 MG chewable tablet Chew 1 tablet by mouth daily.    Historical Provider, MD  carboxymethylcellulose (REFRESH TEARS) 0.5 % SOLN Place 1 drop into both eyes 2 (two) times daily as needed (dry eyes).     Historical Provider, MD  cetirizine (ZYRTEC) 10 MG tablet Take 1 tablet (10 mg total) by mouth daily. 03/23/14   Tiffany L Reed, DO  ciprofloxacin (CIPRO) 250 MG tablet Take 1 tablet (250 mg total) by mouth 2 (two) times daily. 05/21/15   Gildardo Cranker, DO  Cranberry 400 MG TABS Take 400 mg by mouth daily.    Historical Provider, MD  docusate sodium (COLACE) 100 MG capsule Take 100 mg by mouth daily as needed for mild constipation.    Historical Provider, MD  DULoxetine (CYMBALTA) 30 MG capsule Take one tablet  once daily in the morning for depression 04/07/15   Kathrynn Ducking, MD  DULoxetine (CYMBALTA) 60 MG capsule Take one tablet by mouth in the evening once daily for depression 04/07/15   Kathrynn Ducking, MD  fluticasone-salmeterol (ADVAIR [facility]) [phone]  MCG/ACT inhaler Inhale 2 puffs into the lungs 2 (two) times daily.    Historical Provider, MD  HYDROcodone-acetaminophen (NORCO/VICODIN) 5-325 MG tablet Take 1 tablet by mouth 2 (two) times daily.    Historical Provider, MD  HYDROcodone-acetaminophen (NORCO/VICODIN) 5-325 MG tablet Take 1 tablet by mouth every 6 (six) hours as needed for moderate pain.    Historical Provider, MD  hydroxychloroquine (PLAQUENIL) 200 MG tablet Take 200 mg by mouth. Take one tablet twice daily Monday thru Friday, only    Historical Provider, MD  lubiprostone (AMITIZA) 24 MCG capsule Take 1 capsule (24 mcg total) by  mouth 2 (two) times daily with a meal. 07/08/14   Gatha Mayer, MD  LYRICA 75 MG capsule TAKE ONE CAPSULE BY MOUTH TWICE A DAY 02/05/15   Tiffany L Reed, DO  memantine (NAMENDA) 10 MG tablet Take 1 tablet (10 mg total) by mouth 2 (two) times daily. 04/07/15   Kathrynn Ducking, MD  metoprolol (LOPRESSOR) 50 MG tablet Take 1 tablet (50 mg total) by mouth 2 (two) times daily. 01/04/15   Pixie Casino, MD  mirabegron ER (MYRBETRIQ) 25 MG TB24 tablet Take 1 tablet (25 mg total) by mouth daily. 01/04/15   Tiffany L Reed, DO  omeprazole (PRILOSEC) 20 MG capsule Take 20 mg by mouth daily.    Historical Provider, MD  pilocarpine (SALAGEN) 5 MG tablet Take 5 mg by mouth 2 (two) times daily.    Historical Provider, MD  saccharomyces boulardii (FLORASTOR) 250 MG capsule Take 1 tablet twice daily for 3 weeks. 05/21/15   Gildardo Cranker, DO  senna (SENOKOT) 8.6 MG TABS tablet Take 2 tablets (17.2 mg total) by mouth daily. 04/20/15   Orson Eva, MD  sodium chloride (OCEAN) 0.65 % SOLN nasal spray Place 1 spray into the nose as needed for congestion. 08/23/12   Modena Jansky, MD   BP 115/51 mmHg  Pulse 61  Temp(Src) 97.4 F (36.3 C) (Oral)  Resp 12  Ht 5' 3.5" (1.613 m)  Wt 145 lb (65.772 kg)  BMI 25.28 kg/m2  SpO2 95% Physical Exam  Constitutional: She is oriented to person, place, and time. She appears well-developed  and well-nourished. No distress.  HENT:  Head: Normocephalic and atraumatic.  Neck: Normal range of motion. Neck supple.  Cardiovascular: Normal rate and regular rhythm.  Exam reveals no gallop and no friction rub.   No murmur heard. Pulmonary/Chest: Effort normal and breath sounds normal. No respiratory distress. She has no wheezes. She has no rales. She exhibits tenderness.  There is tenderness to palpation of the left lateral and right lateral ribs. There is no crepitus or palpable abnormality.  Abdominal: Soft. Bowel sounds are normal. She exhibits no distension. There is no tenderness.  Musculoskeletal: Normal range of motion.  Neurological: She is alert and oriented to person, place, and time.  Skin: Skin is warm and dry. She is not diaphoretic.  Nursing note and vitals reviewed.   ED Course  Procedures (including critical care time) Labs Review Labs Reviewed - No data to display  Imaging Review No results found. I have personally reviewed and evaluated these images and lab results as part of my medical decision-making.    MDM   Final diagnoses:  None    X-rays of the knee reveal no evidence for fracture or dislocation. There is no rib fracture or pneumothorax. She will be discharged to home with instructions to follow-up as needed if not improving in the next week. Patient also reports not having a bowel movement in the past week. I will advise her to take some magnesium citrate and follow-up with her primary Dr. if not improving.    Veryl Speak, MD 05/25/15 [phone]

## 2015-05-27 ENCOUNTER — Other Ambulatory Visit: Payer: Self-pay

## 2015-05-27 ENCOUNTER — Ambulatory Visit: Payer: Medicare Other | Admitting: Internal Medicine

## 2015-05-28 ENCOUNTER — Other Ambulatory Visit: Payer: Self-pay | Admitting: Internal Medicine

## 2015-05-31 ENCOUNTER — Telehealth: Payer: Self-pay | Admitting: *Deleted

## 2015-05-31 NOTE — Telephone Encounter (Signed)
Patient daughter called and wanted a refill faxed to pharmacy for Pravastatin. This was faxed to pharmacy. She also wanted to know about the Prior Authorization for patient's Myrebetriq. This was done through Cover My Meds but came back Null. I called [phone] and spoke with [facility] and she checked into it for me and then transferred me to [place]. Prior Auth. Was re initiated. Determination in 24-72 hours.

## 2015-06-01 ENCOUNTER — Telehealth: Payer: Self-pay

## 2015-06-01 DIAGNOSIS — Z09 Encounter for follow-up examination after completed treatment for conditions other than malignant neoplasm: Secondary | ICD-10-CM | POA: Diagnosis not present

## 2015-06-01 DIAGNOSIS — Z79899 Other long term (current) drug therapy: Secondary | ICD-10-CM | POA: Diagnosis not present

## 2015-06-01 DIAGNOSIS — M25562 Pain in left knee: Secondary | ICD-10-CM | POA: Diagnosis not present

## 2015-06-01 DIAGNOSIS — M3509 Sicca syndrome with other organ involvement: Secondary | ICD-10-CM | POA: Diagnosis not present

## 2015-06-01 DIAGNOSIS — M5137 Other intervertebral disc degeneration, lumbosacral region: Secondary | ICD-10-CM | POA: Diagnosis not present

## 2015-06-01 DIAGNOSIS — R300 Dysuria: Secondary | ICD-10-CM | POA: Diagnosis not present

## 2015-06-01 NOTE — Telephone Encounter (Signed)
[employer] called to say that patients request for Myrebetriq was approved and if there were any more questions to call [phone]

## 2015-06-01 NOTE — Telephone Encounter (Signed)
Authorization for Myrbetriq 25 mg has been approved. Medication will be approved until May 30, 2016 as long as patient remains on current insurance.   Patient's daughter, Aldia Moppin, and [place] [phone])  have been notified that medication has been approved.   Pharmacy attempted to do a test run to make sure that Rx would go through but claim kept coming up denied. I called [employer] back to find out why and was told that pharmacy ran claim with incorrect claim numbers.   Pharmacy was called back with correct claim number information but they still could not get Rx to clear. I gave the pharmacy tech the direct line phone number [phone] to [place] so that they could contact [employer] to find out why claim would not go through their system. Pharmacy tech stated that she would call once she found out what was stopping Rx claim.

## 2015-06-01 NOTE — Telephone Encounter (Signed)
Patient's daughter, Nelta, asked if a Rx for nystatin (mycostatin) 100,000 ml/susp could be called in for her mother. Ailed states that her mother is having a lot of mouth pain due to mouth surgeries and resistant dry mouth. She also states that her mouth has used Veterinary surgeon that was given to her while in a nursing facility but that it does not work well.    Please advise

## 2015-06-01 NOTE — Telephone Encounter (Signed)
She needs to actually come in for an appointment.  I know it's hard to get her here, but she really needs to be seen.  She's had several falls, another ED visit and now has these mouth concerns and I have yet to see her.  (She has missed 2 or 3 appts now.)  Without seeing her mouth, it's hard to know what's really happening.

## 2015-06-02 NOTE — Telephone Encounter (Signed)
Please call daughter, Aylen, to schedule an appointment.

## 2015-06-02 NOTE — Telephone Encounter (Signed)
Spoke with patient's daughter regarding bring her mother in for appointment, because her mother is having a lot of mouth pain due to mouth surgeries and dry mouth. She stated that she will have her caregiver's call to let us know if she can keep the appointment for Thursday at 1:15pm

## 2015-06-03 ENCOUNTER — Ambulatory Visit (INDEPENDENT_AMBULATORY_CARE_PROVIDER_SITE_OTHER): Payer: Medicare Other | Admitting: Internal Medicine

## 2015-06-03 ENCOUNTER — Encounter: Payer: Self-pay | Admitting: Internal Medicine

## 2015-06-03 VITALS — BP 110/70 | HR 77 | Temp 98.0°F | Wt 148.0 lb

## 2015-06-03 DIAGNOSIS — T402X5A Adverse effect of other opioids, initial encounter: Secondary | ICD-10-CM

## 2015-06-03 DIAGNOSIS — L8992 Pressure ulcer of unspecified site, stage 2: Secondary | ICD-10-CM | POA: Diagnosis not present

## 2015-06-03 DIAGNOSIS — K5903 Drug induced constipation: Secondary | ICD-10-CM

## 2015-06-03 DIAGNOSIS — Z79899 Other long term (current) drug therapy: Secondary | ICD-10-CM

## 2015-06-03 DIAGNOSIS — M25552 Pain in left hip: Secondary | ICD-10-CM

## 2015-06-03 DIAGNOSIS — R5381 Other malaise: Secondary | ICD-10-CM | POA: Diagnosis not present

## 2015-06-03 DIAGNOSIS — R682 Dry mouth, unspecified: Secondary | ICD-10-CM | POA: Diagnosis not present

## 2015-06-03 MED ORDER — HYDROCODONE-ACETAMINOPHEN 5-325 MG PO TABS
1.0000 | ORAL_TABLET | Freq: Four times a day (QID) | ORAL | Status: DC | PRN
Start: 2015-06-03 — End: 2015-09-06

## 2015-06-03 MED ORDER — FIRST-DUKES MOUTHWASH MT SUSP: OROMUCOSAL | Status: DC

## 2015-06-03 NOTE — Patient Instructions (Addendum)
Stop lipitor, pravachol and zyrtec.

## 2015-06-03 NOTE — Progress Notes (Signed)
Patient ID: Ariana [name], female   DOB: [birth year], 82 y.o.   MRN: [id number]   Location:   [facility]   Place of Service:   clinic  Provider: Makahla Kiser L. Mariea Clonts, D.O., C.M.D.  Code Status: DNR Goals of Care:  Advanced Directives 06/03/2015  Does patient have an advance directive? Yes  Type of Advance Directive Healthcare Power of Attorney  Copy of advanced directive(s) in chart? Yes  Would patient like information on creating an advanced directive? -     Chief Complaint  Patient presents with  . Medical Management of Chronic Issues    dry mouth    HPI: Patient is a 82 y.o. female seen today for medical management of chronic diseases.  She has a dry mouth which is chronic and requests a magic mouthwash with lidocaine.  She was meant to come in several times since she's been to the ED and fallen, but her appts keep getting canceled or she no shows.  She had also been hospitalized and went to rehab in March.  I have not seen her since.  4/11, she fell.  Hurt her left knee.  It hurts in her left thigh, knee and foot.  No fractures, did have rib contusions when went to [facility].  She's ben slower since even just over the weekend.  She has a Marine scientist from [place] checking on he.  Her caregiver is with her today.    Has a place on her bottom (left lower cheek).  Hurt one morning and it was moist.  She got a tube of medication that the [facility] nurse recommended for her bottom.  It is getting better.  The aides have been moving her every couple of hrs.   Left knee and leg were enormous and aide has been icing them.  Went to Dr. Estanislado Pandy about her knee and leg.        At night, she still has no one with her and on the weekends.   Her aide is leaving a light on in each room so she can find her way around.    Still c/o hard stools--balls of stool.  Taking senna and colace and amitiza and still this way.    Her caregiver reports she is still having a lot of pain.  Completed cipro and  florastor for a UTI.    Past Medical History  Diagnosis Date  . Sjogren's syndrome ([place])   . Dry eye syndrome   . Hypertension, benign   . Mitral valve prolapse   . GERD (gastroesophageal reflux disease)   . Diverticulosis of colon   . Irritable bowel syndrome   . Urinary incontinence   . Low back pain syndrome   . Fibromyalgia   . Memory loss   . Anxiety and depression   . History of adverse drug reaction   . Peripheral neuropathy ([facility])     "both feet and legs"  . Shortness of breath 07/18/11    "alot lately"  . Anginal pain ([place])   . History of recurrent UTIs   . H/O hiatal hernia   . Anxiety   . Dementia without behavioral disturbance   . Depression   . Abnormality of gait   . Thyroid nodule   . Headache(784.0) 09/05/2012  . Adenomatous polyp of colon 2002    37mm  . History of cerebrovascular disease 09/24/2014  . Physical deconditioning   . Paroxysmal a-fib ([place])   . Therapeutic opioid-induced constipation (OIC)   .  Dysthymic disorder   . Chronic back pain   . Dysphagia   . Dry mouth   . Allergic rhinitis     Past Surgical History  Procedure Laterality Date  . Vesicovaginal fistula closure w/ tah    . Appendectomy    . Cholecystectomy  2000  . Mandible surgery    . Temporomandibular joint surgery  1986    Dr. Terence Lux  . Cataract extraction, bilateral    . Abdominal hysterectomy  1967  . Dental surgery      multiple tooth extractions  . Esophagogastroduodenoscopy (egd) with esophageal dilation N/A 08/23/2012    Procedure: ESOPHAGOGASTRODUODENOSCOPY (EGD) WITH ESOPHAGEAL DILATION;  Surgeon: Milus Banister, MD;  Location: [facility];  Service: Endoscopy;  Laterality: N/A;  . Colonoscopy w/ biopsies      multiple  . Nasal septum surgery  1980  . Transthoracic echocardiogram  2001    mild LVH, normal LV  . Nm myocar perf wall motion  2003    persantine - normal static and dynamic study w/apical thinning and presvered LV function, no ischemia  . Cardiac  catheterization  02/17/2003    normal L main, LAD free of disease, Cfx free of disease, RCA free of disease (Dr. Loni Muse. [facility])    Allergies  Allergen Reactions  . Banana Nausea And Vomiting  . Codeine Nausea Only    unless given with Phenergan  . Klonopin [Clonazepam] Other (See Comments)    Causes hallucination   . Meperidine Hcl Nausea Only    unless given with Phenergan  . Norflex [Orphenadrine Citrate] Nausea Only    Unless given with Phenergan  . Oxycodone-Acetaminophen Nausea Only    unless given with phenergan  . Propoxyphene Hcl Nausea Only    unless given with phenergan  . Zoloft [Sertraline Hcl] Other (See Comments)    Caused lethargy  . Doxycycline Other (See Comments)    Unknown  . Naproxen Other (See Comments)  . Penicillins Other (See Comments)    Unknown  . Phenothiazines Other (See Comments)    Unknown  . Stelazine Other (See Comments)    Unknown  . Sulfamethoxazole-Trimethoprim Other (See Comments)    Unknown  . Tolectin [Tolmetin Sodium] Other (See Comments)    Unknown  . Tramadol Other (See Comments)    Unknown      Medication List       This list is accurate as of: 06/03/15  1:41 PM.  Always use your most recent med list.               ALPRAZolam 0.5 MG tablet  Commonly known as:  XANAX  Take 1 tablet (0.5 mg total) by mouth 2 (two) times daily as needed for anxiety.     aspirin 325 MG tablet  Take 1 tablet (325 mg total) by mouth daily.     atorvastatin 10 MG tablet  Commonly known as:  LIPITOR  Take 10 mg by mouth daily.     bisacodyl 5 MG EC tablet  Commonly known as:  DULCOLAX  Take 10 mg by mouth daily as needed for moderate constipation.     calcium carbonate 500 MG chewable tablet  Commonly known as:  TUMS - dosed in mg elemental calcium  Chew 1 tablet by mouth daily.     cetirizine 10 MG tablet  Commonly known as:  ZYRTEC  Take 1 tablet (10 mg total) by mouth daily.     ciprofloxacin 250 MG tablet  Commonly known as:  CIPRO  Take 1 tablet (250 mg total) by mouth 2 (two) times daily.     Cranberry 400 MG Tabs  Take 400 mg by mouth daily.     docusate sodium 100 MG capsule  Commonly known as:  COLACE  Take 100 mg by mouth daily as needed for mild constipation.     DULoxetine 60 MG capsule  Commonly known as:  CYMBALTA  Take one tablet by mouth in the evening once daily for depression     DULoxetine 30 MG capsule  Commonly known as:  CYMBALTA  Take one tablet  once daily in the morning for depression     fluticasone-salmeterol 45-21 MCG/ACT inhaler  Commonly known as:  ADVAIR HFA  Inhale 2 puffs into the lungs 2 (two) times daily.     HYDROcodone-acetaminophen 5-325 MG tablet  Commonly known as:  NORCO/VICODIN  Take 1 tablet by mouth every 6 (six) hours as needed for moderate pain.     hydroxychloroquine 200 MG tablet  Commonly known as:  PLAQUENIL  Take 200 mg by mouth. Take one tablet twice daily Monday thru Friday, only     lubiprostone 24 MCG capsule  Commonly known as:  AMITIZA  Take 1 capsule (24 mcg total) by mouth 2 (two) times daily with a meal.     LYRICA 75 MG capsule  Generic drug:  pregabalin  TAKE ONE CAPSULE BY MOUTH TWICE A DAY     magic mouthwash w/lidocaine Soln  Take 5 mLs by mouth 4 (four) times daily.     memantine 10 MG tablet  Commonly known as:  NAMENDA  Take 1 tablet (10 mg total) by mouth 2 (two) times daily.     metoprolol 50 MG tablet  Commonly known as:  LOPRESSOR  Take 1 tablet (50 mg total) by mouth 2 (two) times daily.     mirabegron ER 25 MG Tb24 tablet  Commonly known as:  MYRBETRIQ  Take 1 tablet (25 mg total) by mouth daily.     omeprazole 20 MG capsule  Commonly known as:  PRILOSEC  Take 20 mg by mouth daily.     pilocarpine 5 MG tablet  Commonly known as:  SALAGEN  Take 5 mg by mouth 2 (two) times daily.     pravastatin 20 MG tablet  Commonly known as:  PRAVACHOL  TAKE ONE TABLET BY MOUTH ONCE DAILY AT  6PM     REFRESH TEARS 0.5 %  Soln  Generic drug:  carboxymethylcellulose  Place 1 drop into both eyes 2 (two) times daily as needed (dry eyes).     saccharomyces boulardii 250 MG capsule  Commonly known as:  FLORASTOR  Take 1 tablet twice daily for 3 weeks.     senna 8.6 MG Tabs tablet  Commonly known as:  SENOKOT  Take 2 tablets (17.2 mg total) by mouth daily.     sodium chloride 0.65 % Soln nasal spray  Commonly known as:  OCEAN  Place 1 spray into the nose as needed for congestion.        Review of Systems:  Review of Systems  Constitutional: Negative for fever, chills, appetite change and unexpected weight change.  HENT: Negative for congestion and sinus pressure.   Respiratory: Negative for chest tightness and shortness of breath.   Cardiovascular: Negative for chest pain, palpitations and leg swelling.  Gastrointestinal: Positive for abdominal pain, constipation and abdominal distention. Negative for nausea and diarrhea.       Still has hard  pebble stools  Genitourinary: Positive for urgency. Negative for dysuria and difficulty urinating.  Musculoskeletal: Positive for myalgias, back pain, joint swelling, arthralgias and gait problem.       Left knee tender and swollen  Skin: Positive for pallor.  Neurological: Positive for weakness. Negative for dizziness.       Very unsteady gait, uses walker  Psychiatric/Behavioral: Positive for confusion, decreased concentration and agitation. The patient is nervous/anxious.     Health Maintenance  Topic Date Due  . DEXA SCAN  07/04/1996  . INFLUENZA VACCINE  09/14/2015  . TETANUS/TDAP  12/09/2023  . ZOSTAVAX  Completed  . PNA vac Low Risk Adult  Completed    Physical Exam: Filed Vitals:   06/03/15 1324  BP: 110/70  Pulse: 77  Temp: 98 F (36.7 C)  TempSrc: Oral  Weight: 148 lb (67.132 kg)  SpO2: 98%   Body mass index is 25.8 kg/(m^2). Physical Exam  Constitutional: No distress.  HENT:  Head: Normocephalic and atraumatic.  Cardiovascular:  Normal rate, regular rhythm, normal heart sounds and intact distal pulses.   Pulmonary/Chest: Effort normal and breath sounds normal. No respiratory distress.  Abdominal: Soft. Bowel sounds are normal. She exhibits distension. She exhibits no mass. There is no tenderness. There is no rebound and no guarding.  Musculoskeletal:  Stooped posture, walks with walker; gets up very slowly; some mild swelling of left knee and tenderness of both knee posteriorly; edema of left leg otherwise has resolved  Neurological: She is alert.  Oriented to person and place only; answers questions incorrectly and argues with caregiver (or daughter when she comes)   Skin: Skin is warm and dry. There is pallor.  Stage 2 left buttock  Psychiatric:  Very negative about everything, flat affect    Labs reviewed: Basic Metabolic Panel:  Recent Labs  12/04/14 0136  12/17/14 0215 04/18/15 1535 04/19/15 04/19/15 0349 04/20/15 04/20/15 0332 04/28/15  NA 140  < > 140 140  --   --  143 143 145  K 3.5  < > 3.9 4.1  --   --   --  3.5 3.6  CL 101  < > 103 103  --   --   --  110  --   CO2 29  < > 27 28  --   --   --  26  --   GLUCOSE 124*  < > 89 134*  --   --   --  134*  --   BUN 19  < > 12 18  --   --  13 13 13   CREATININE 0.75  < > 0.70 0.68  --   --  0.6 0.65 0.7  CALCIUM 9.5  < > 9.3 10.0  --   --   --  9.2  --   TSH 1.132  --   --   --  1.68 1.680  --   --   --   < > = values in this interval not displayed. Liver Function Tests:  Recent Labs  12/08/14 0630 12/16/14 1223 04/18/15 1535  AST 32 24 24  ALT 29 20 19   ALKPHOS 86 74 93  BILITOT 0.6 0.3 0.6  PROT 6.4* 6.0* 7.7  ALBUMIN 3.5 3.3* 4.3    Recent Labs  12/07/14 1409  LIPASE 36    Recent Labs  12/04/14 0815  AMMONIA 28   CBC:  Recent Labs  12/10/14 1613 12/16/14 1223  12/17/14 0215 04/18/15 1535 04/28/15  WBC 5.1  5.1  --  4.3 7.5 4.5  NEUTROABS 2.7 2.5  --   --  5.6  --   HGB  --  11.0*  < > 11.6* 13.6 12.2  HCT 35.2 33.6*  <  > 35.5* 42.4 38  MCV 88 90.8  --  90.3 92.8  --   PLT 241 225  --  241 205 214  < > = values in this interval not displayed. Lipid Panel:  Recent Labs  07/27/14 0332 12/17/14 0215  CHOL 157 121  HDL 48 45  LDLCALC 97 60  TRIG 58 78  CHOLHDL 3.3 2.7   Lab Results  Component Value Date   HGBA1C 6.4* 04/19/2015    Procedures since last visit: Dg Ribs Bilateral W/chest  05/25/2015  CLINICAL DATA:  Fall 3 days ago, bilateral rib pain, left knee pain EXAM: BILATERAL RIBS AND CHEST - 4+ VIEW COMPARISON:  04/18/2015 FINDINGS: Seven views bilateral ribs submitted. No acute infiltrate or pulmonary edema. No rib fracture is identified. There is no pneumothorax. IMPRESSION: Negative. Electronically Signed   By: Lahoma Crocker M.D.   On: 05/25/2015 16:07   Dg Knee Complete 4 Views Left  05/25/2015  CLINICAL DATA:  Pain following fall 3 days prior EXAM: LEFT KNEE - COMPLETE 4+ VIEW COMPARISON:  None. FINDINGS: Frontal, lateral, and bilateral oblique views were obtained. There is no appreciable fracture or dislocation. No appreciable joint effusion. There is moderate narrowing medially and in the patellofemoral joint regions. There is spurring along the anterior and posterior aspects of the patella. No erosive change. IMPRESSION: Areas of osteoarthritic change.  No fracture or joint effusion. Electronically Signed   By: Lowella Grip III M.D.   On: 05/25/2015 16:03    Assessment/Plan 1. Left hip pain - s/p fall - for some reason, her daughter and son-in-law are only giving her two of her hydrocodone per day rather than the 4 allotted on the prescription--explained that she can have one every 6 hrs for her recently increased pain since her fall - HYDROcodone-acetaminophen (NORCO/VICODIN) 5-325 MG tablet; Take 1 tablet by mouth every 6 (six) hours as needed for moderate pain.  Dispense: 120 tablet; Refill: 0  2. Pressure ulcer stage II -epidermis is off in a small dime-sized area of her left  buttock and a quarter-sized area surrounding this is erythematous - HYDROcodone-acetaminophen (NORCO/VICODIN) 5-325 MG tablet; Take 1 tablet by mouth every 6 (six) hours as needed for moderate pain.  Dispense: 120 tablet; Refill: 0  3. Physical deconditioning -continue with exercises as taught by therapy -cont caregiver for as many hours as is affordable at this point -pt refuses SNF and her daughter does not want to go through the guardianship process  4. Therapeutic opioid induced constipation -at least with her two stools softeners and amitiza she is having regular BMs per the caregiver -she is not regularly having explosive diarrhea either which was happening with miralax and linzess; movantik has not been affordable in the past---this could be retried if at least cutting out one anticholinergic med doesn't help (zyrtec)  5. Dry mouth - she c/o pain on the roof of her mouth and beneath her upper lip but no visible infection/sores - Diphenhyd-Hydrocort-Nystatin (FIRST-DUKES MOUTHWASH) SUSP; 1 tsp garlge and swallow four times daily  Dispense: 240 mL; Refill: 6  6.  Polypharmacy: -due to her dementia, multiple meds with high risks of interactions and desire to take fewer pills (try to reduce anticholinergic/dry-out burden), will d/c lipitor, pravachol (why she  was on both is beyond me anyway) and zytrec -continues on xanax which she seems to need for bipolar -unclear if namenda is helping behavior--will need to get her daughter's opinion on that if she can tell, also consider d/c tums due to its constipating effect at next visit (doubt it's giving her adequate osteoporosis prevention anyway w/o vitamin D or other therapy)  Labs/tests ordered:  No orders of the defined types were placed in this encounter.   Next appt:  09/06/2015 med mgt  Cobe Viney L. Nathian Stencil, D.O. [facility] [address], [place] [address] Cell Phone (Mon-Fri 8am-5pm):   [phone] On Call:  [phone] & follow prompts after 5pm & weekends Office Phone:  [phone] Office Fax:  [phone]

## 2015-06-08 ENCOUNTER — Telehealth: Payer: Self-pay

## 2015-06-08 NOTE — Telephone Encounter (Signed)
Does she have to pay that b/c of a deductible to meet or will she have to pay it regardless?   Ok to give them the samples if someone can pick them up.

## 2015-06-08 NOTE — Telephone Encounter (Signed)
We have received approval on the prior authorization that was done. The $159 is what the patient has to pay after the insurance has paid. Original cost was well over $370.  We have enough samples to last 21 days.   Please advise.

## 2015-06-08 NOTE — Telephone Encounter (Signed)
I spoke with the Pharmacy Tech at [facility] to make sure that the prescription for Myrbetriq 25 mg tablets was cleared by the insurance to be filled. The prescription can be filled through patient's insurance but the out of pocket cost to the patient is going to be $159.00.   After speaking with [name] about the price of this medication, she asked if there was any generic alternative to the Myrbetriq that would work just as well. Ariana [name] states that she has about a weeks worth of medication left for her mother and would really like a less costly alternative if possible.    Please Advise.

## 2015-06-08 NOTE — Telephone Encounter (Signed)
There are no safe alternatives.  Is there not a prior authorization I can complete for her need for the myrbetriq? If not, we can provide some samples if we have any.

## 2015-06-08 NOTE — Telephone Encounter (Signed)
I spoke with her daughter, Avy, this morning about the cost. She stated that the Myrbetriq has always been expensive but they pay for it because it seems to help her mother with not having to get up so many times a night.   I will call daughter and let her know that we can give her samples today.  Storme Fesmire stated that her husband would pick the samples up either today or tomorrow.

## 2015-06-08 NOTE — Telephone Encounter (Signed)
Great, thanks [place]!

## 2015-06-10 ENCOUNTER — Telehealth: Payer: Self-pay | Admitting: *Deleted

## 2015-06-10 NOTE — Telephone Encounter (Signed)
Patient daughter, Sumera notified and agreed.

## 2015-06-10 NOTE — Telephone Encounter (Signed)
Jhoselyn, daughter called and stated that she was out of town for patient's appointment. She wants to know why the Pravastatin was discontinued. They were told when this was prescribed it was for her stroke. They told them the reason why they placed her on this medication was due to history of falls.  Please Advise.

## 2015-06-10 NOTE — Telephone Encounter (Signed)
I stopped it due to her frail state at this time to reduce pill burden.  Statins work well at preventing cholesterol buildup over years, but at 67, I don't think she is getting much benefit and it might be contributing to the pains in the backs of her legs.

## 2015-06-21 ENCOUNTER — Telehealth: Payer: Self-pay | Admitting: *Deleted

## 2015-06-21 NOTE — Telephone Encounter (Signed)
Maria with [facility] called and requested verbal orders to extend PT 2 times per week for 3 weeks. Verbal orders given.

## 2015-06-22 ENCOUNTER — Telehealth: Payer: Self-pay

## 2015-06-22 NOTE — Telephone Encounter (Signed)
Prior authorization was received for Amitiza 24 mcg capsules. Prior authorization was initiated via covermymeds.com. Keyword: [id number].   Awaiting Determination.

## 2015-06-25 NOTE — Telephone Encounter (Signed)
Patient daughter stated that Amitiza was APPROVED.

## 2015-06-29 ENCOUNTER — Inpatient Hospital Stay (HOSPITAL_COMMUNITY)
Admission: EM | Admit: 2015-06-29 | Discharge: 2015-07-01 | DRG: 690 | Disposition: A | Discharge status: Home Health | Payer: Medicare Other | Attending: Internal Medicine | Admitting: Internal Medicine

## 2015-06-29 ENCOUNTER — Emergency Department (HOSPITAL_COMMUNITY): Payer: Medicare Other

## 2015-06-29 ENCOUNTER — Encounter (HOSPITAL_COMMUNITY): Payer: Self-pay

## 2015-06-29 DIAGNOSIS — I480 Paroxysmal atrial fibrillation: Secondary | ICD-10-CM | POA: Diagnosis present

## 2015-06-29 DIAGNOSIS — F0390 Unspecified dementia without behavioral disturbance: Secondary | ICD-10-CM | POA: Diagnosis present

## 2015-06-29 DIAGNOSIS — Z91018 Allergy to other foods: Secondary | ICD-10-CM

## 2015-06-29 DIAGNOSIS — M545 Low back pain, unspecified: Secondary | ICD-10-CM | POA: Diagnosis present

## 2015-06-29 DIAGNOSIS — Z880 Allergy status to penicillin: Secondary | ICD-10-CM | POA: Diagnosis not present

## 2015-06-29 DIAGNOSIS — Z90710 Acquired absence of both cervix and uterus: Secondary | ICD-10-CM | POA: Diagnosis not present

## 2015-06-29 DIAGNOSIS — G8929 Other chronic pain: Secondary | ICD-10-CM | POA: Diagnosis present

## 2015-06-29 DIAGNOSIS — Z87440 Personal history of urinary (tract) infections: Secondary | ICD-10-CM

## 2015-06-29 DIAGNOSIS — H04129 Dry eye syndrome of unspecified lacrimal gland: Secondary | ICD-10-CM | POA: Diagnosis present

## 2015-06-29 DIAGNOSIS — F329 Major depressive disorder, single episode, unspecified: Secondary | ICD-10-CM | POA: Diagnosis present

## 2015-06-29 DIAGNOSIS — E860 Dehydration: Secondary | ICD-10-CM | POA: Diagnosis present

## 2015-06-29 DIAGNOSIS — K219 Gastro-esophageal reflux disease without esophagitis: Secondary | ICD-10-CM | POA: Diagnosis present

## 2015-06-29 DIAGNOSIS — Z8249 Family history of ischemic heart disease and other diseases of the circulatory system: Secondary | ICD-10-CM | POA: Diagnosis not present

## 2015-06-29 DIAGNOSIS — J189 Pneumonia, unspecified organism: Secondary | ICD-10-CM | POA: Diagnosis not present

## 2015-06-29 DIAGNOSIS — R7303 Prediabetes: Secondary | ICD-10-CM | POA: Diagnosis present

## 2015-06-29 DIAGNOSIS — I959 Hypotension, unspecified: Secondary | ICD-10-CM | POA: Diagnosis not present

## 2015-06-29 DIAGNOSIS — G629 Polyneuropathy, unspecified: Secondary | ICD-10-CM | POA: Diagnosis not present

## 2015-06-29 DIAGNOSIS — M3500 Sicca syndrome, unspecified: Secondary | ICD-10-CM | POA: Diagnosis not present

## 2015-06-29 DIAGNOSIS — I10 Essential (primary) hypertension: Secondary | ICD-10-CM | POA: Diagnosis present

## 2015-06-29 DIAGNOSIS — Z7982 Long term (current) use of aspirin: Secondary | ICD-10-CM

## 2015-06-29 DIAGNOSIS — Z881 Allergy status to other antibiotic agents status: Secondary | ICD-10-CM

## 2015-06-29 DIAGNOSIS — F341 Dysthymic disorder: Secondary | ICD-10-CM | POA: Diagnosis present

## 2015-06-29 DIAGNOSIS — R531 Weakness: Secondary | ICD-10-CM | POA: Diagnosis present

## 2015-06-29 DIAGNOSIS — I341 Nonrheumatic mitral (valve) prolapse: Secondary | ICD-10-CM | POA: Diagnosis present

## 2015-06-29 DIAGNOSIS — Z8673 Personal history of transient ischemic attack (TIA), and cerebral infarction without residual deficits: Secondary | ICD-10-CM

## 2015-06-29 DIAGNOSIS — K589 Irritable bowel syndrome without diarrhea: Secondary | ICD-10-CM | POA: Diagnosis present

## 2015-06-29 DIAGNOSIS — Z882 Allergy status to sulfonamides status: Secondary | ICD-10-CM

## 2015-06-29 DIAGNOSIS — Z886 Allergy status to analgesic agent status: Secondary | ICD-10-CM | POA: Diagnosis not present

## 2015-06-29 DIAGNOSIS — F419 Anxiety disorder, unspecified: Secondary | ICD-10-CM | POA: Diagnosis not present

## 2015-06-29 DIAGNOSIS — Z825 Family history of asthma and other chronic lower respiratory diseases: Secondary | ICD-10-CM | POA: Diagnosis not present

## 2015-06-29 DIAGNOSIS — Z9049 Acquired absence of other specified parts of digestive tract: Secondary | ICD-10-CM

## 2015-06-29 DIAGNOSIS — N390 Urinary tract infection, site not specified: Secondary | ICD-10-CM | POA: Diagnosis not present

## 2015-06-29 DIAGNOSIS — R001 Bradycardia, unspecified: Secondary | ICD-10-CM | POA: Diagnosis present

## 2015-06-29 DIAGNOSIS — Z885 Allergy status to narcotic agent status: Secondary | ICD-10-CM

## 2015-06-29 DIAGNOSIS — Z888 Allergy status to other drugs, medicaments and biological substances status: Secondary | ICD-10-CM | POA: Diagnosis not present

## 2015-06-29 DIAGNOSIS — K5909 Other constipation: Secondary | ICD-10-CM | POA: Diagnosis present

## 2015-06-29 DIAGNOSIS — M797 Fibromyalgia: Secondary | ICD-10-CM | POA: Diagnosis present

## 2015-06-29 DIAGNOSIS — Z79899 Other long term (current) drug therapy: Secondary | ICD-10-CM

## 2015-06-29 DIAGNOSIS — K5900 Constipation, unspecified: Secondary | ICD-10-CM | POA: Diagnosis present

## 2015-06-29 LAB — I-STAT CG4 LACTIC ACID, ED: Lactic Acid, Venous: 1.48 mmol/L (ref 0.5–2.0)

## 2015-06-29 LAB — CBC WITH DIFFERENTIAL/PLATELET
Basophils Absolute: 0 10*3/uL (ref 0.0–0.1)
Basophils Relative: 1 %
EOS PCT: 2 %
Eosinophils Absolute: 0.1 10*3/uL (ref 0.0–0.7)
HEMATOCRIT: 40.3 % (ref 36.0–46.0)
Hemoglobin: 13.4 g/dL (ref 12.0–15.0)
LYMPHS PCT: 38 %
Lymphs Abs: 2.4 10*3/uL (ref 0.7–4.0)
MCH: 29.7 pg (ref 26.0–34.0)
MCHC: 33.3 g/dL (ref 30.0–36.0)
MCV: 89.4 fL (ref 78.0–100.0)
MONO ABS: 0.8 10*3/uL (ref 0.1–1.0)
MONOS PCT: 13 %
NEUTROS ABS: 3.1 10*3/uL (ref 1.7–7.7)
Neutrophils Relative %: 46 %
PLATELETS: 203 10*3/uL (ref 150–400)
RBC: 4.51 MIL/uL (ref 3.87–5.11)
RDW: 13.5 % (ref 11.5–15.5)
WBC: 6.4 10*3/uL (ref 4.0–10.5)

## 2015-06-29 LAB — URINALYSIS, ROUTINE W REFLEX MICROSCOPIC
Bilirubin Urine: NEGATIVE
GLUCOSE, UA: NEGATIVE mg/dL
Hgb urine dipstick: NEGATIVE
KETONES UR: NEGATIVE mg/dL
NITRITE: NEGATIVE
PH: 5 (ref 5.0–8.0)
PROTEIN: NEGATIVE mg/dL
Specific Gravity, Urine: 1.015 (ref 1.005–1.030)

## 2015-06-29 LAB — COMPREHENSIVE METABOLIC PANEL
ALT: 23 U/L (ref 14–54)
ANION GAP: 8 (ref 5–15)
AST: 23 U/L (ref 15–41)
Albumin: 4.4 g/dL (ref 3.5–5.0)
Alkaline Phosphatase: 94 U/L (ref 38–126)
BILIRUBIN TOTAL: 0.5 mg/dL (ref 0.3–1.2)
BUN: 29 mg/dL — ABNORMAL HIGH (ref 6–20)
CO2: 26 mmol/L (ref 22–32)
Calcium: 9.9 mg/dL (ref 8.9–10.3)
Chloride: 106 mmol/L (ref 101–111)
Creatinine, Ser: 0.75 mg/dL (ref 0.44–1.00)
GFR calc non Af Amer: >60 mL/min (ref >60)
GLUCOSE: 133 mg/dL — AB (ref 65–99)
POTASSIUM: 4.2 mmol/L (ref 3.5–5.1)
Sodium: 140 mmol/L (ref 135–145)
TOTAL PROTEIN: 7.3 g/dL (ref 6.5–8.1)

## 2015-06-29 LAB — URINE MICROSCOPIC-ADD ON

## 2015-06-29 LAB — MAGNESIUM: Magnesium: 2 mg/dL (ref 1.7–2.4)

## 2015-06-29 LAB — PHOSPHORUS: Phosphorus: 3.5 mg/dL (ref 2.5–4.6)

## 2015-06-29 LAB — MRSA PCR SCREENING: MRSA by PCR: POSITIVE — AB

## 2015-06-29 MED ORDER — ALPRAZOLAM 0.5 MG PO TABS: 0.5000 mg | ORAL_TABLET | Freq: Two times a day (BID) | ORAL | Status: DC | PRN

## 2015-06-29 MED ORDER — SENNA 8.6 MG PO TABS: 2.0000 | ORAL_TABLET | Freq: Every day | ORAL | Status: DC | PRN

## 2015-06-29 MED ORDER — LUBIPROSTONE 24 MCG PO CAPS
24.0000 ug | ORAL_CAPSULE | Freq: Two times a day (BID) | ORAL | Status: DC
  Administered 2015-06-30 – 2015-07-01 (×3): 24 ug via ORAL
  Filled 2015-06-29 (×8): qty 1

## 2015-06-29 MED ORDER — PANTOPRAZOLE SODIUM 40 MG PO TBEC
40.0000 mg | DELAYED_RELEASE_TABLET | Freq: Every day | ORAL | Status: DC
  Administered 2015-06-30 – 2015-07-01 (×2): 40 mg via ORAL
  Filled 2015-06-29 (×2): qty 1

## 2015-06-29 MED ORDER — SALINE SPRAY 0.65 % NA SOLN
1.0000 | NASAL | Status: DC | PRN
  Filled 2015-06-29: qty 44

## 2015-06-29 MED ORDER — CALCIUM CARBONATE ANTACID 500 MG PO CHEW
1.0000 | CHEWABLE_TABLET | Freq: Every day | ORAL | Status: DC
  Administered 2015-06-30 – 2015-07-01 (×2): 200 mg via ORAL
  Filled 2015-06-29 (×2): qty 1

## 2015-06-29 MED ORDER — MEMANTINE HCL 10 MG PO TABS
10.0000 mg | ORAL_TABLET | Freq: Two times a day (BID) | ORAL | Status: DC
  Administered 2015-06-29 – 2015-07-01 (×5): 10 mg via ORAL
  Filled 2015-06-29: qty 2
  Filled 2015-06-29 (×4): qty 1
  Filled 2015-06-29: qty 2

## 2015-06-29 MED ORDER — DEXTROSE 5 % IV SOLN
1.0000 g | Freq: Once | INTRAVENOUS | Status: AC
  Administered 2015-06-29: 1 g via INTRAVENOUS
  Filled 2015-06-29: qty 10

## 2015-06-29 MED ORDER — DULOXETINE HCL 30 MG PO CPEP: 30.0000 mg | ORAL_CAPSULE | Freq: Every day | ORAL | Status: DC

## 2015-06-29 MED ORDER — ASPIRIN 325 MG PO TABS
325.0000 mg | ORAL_TABLET | Freq: Every day | ORAL | Status: DC
  Administered 2015-06-29 – 2015-07-01 (×3): 325 mg via ORAL
  Filled 2015-06-29 (×3): qty 1

## 2015-06-29 MED ORDER — POLYVINYL ALCOHOL 1.4 % OP SOLN
1.0000 [drp] | OPHTHALMIC | Status: DC | PRN
  Administered 2015-06-30: 1 [drp] via OPHTHALMIC
  Filled 2015-06-29 (×2): qty 15

## 2015-06-29 MED ORDER — ENOXAPARIN SODIUM 40 MG/0.4ML ~~LOC~~ SOLN
40.0000 mg | SUBCUTANEOUS | Status: DC
  Administered 2015-06-29 – 2015-07-01 (×3): 40 mg via SUBCUTANEOUS
  Filled 2015-06-29 (×3): qty 0.4

## 2015-06-29 MED ORDER — DULOXETINE HCL 30 MG PO CPEP: 60.0000 mg | ORAL_CAPSULE | Freq: Every day | ORAL | Status: DC

## 2015-06-29 MED ORDER — DULOXETINE HCL 30 MG PO CPEP
30.0000 mg | ORAL_CAPSULE | Freq: Every day | ORAL | Status: DC
  Administered 2015-06-30 – 2015-07-01 (×2): 30 mg via ORAL
  Filled 2015-06-29 (×2): qty 1

## 2015-06-29 MED ORDER — DOCUSATE SODIUM 100 MG PO CAPS: 100.0000 mg | ORAL_CAPSULE | Freq: Every day | ORAL | Status: DC | PRN

## 2015-06-29 MED ORDER — SODIUM CHLORIDE 0.9 % IV SOLN
INTRAVENOUS | Status: AC
Start: 2015-06-29 — End: 2015-06-30
  Administered 2015-06-29 – 2015-06-30 (×2): via INTRAVENOUS

## 2015-06-29 MED ORDER — CARBOXYMETHYLCELLULOSE SODIUM 0.5 % OP SOLN: 1.0000 [drp] | Freq: Two times a day (BID) | OPHTHALMIC | Status: DC | PRN

## 2015-06-29 MED ORDER — SODIUM CHLORIDE 0.9 % IV BOLUS (SEPSIS)
1000.0000 mL | Freq: Once | INTRAVENOUS | Status: AC
  Administered 2015-06-29: 1000 mL via INTRAVENOUS

## 2015-06-29 MED ORDER — CEFTRIAXONE SODIUM 1 G IJ SOLR
1.0000 g | INTRAMUSCULAR | Status: DC
  Administered 2015-06-30: 1 g via INTRAVENOUS
  Filled 2015-06-29 (×2): qty 10

## 2015-06-29 MED ORDER — PREGABALIN 75 MG PO CAPS
75.0000 mg | ORAL_CAPSULE | Freq: Two times a day (BID) | ORAL | Status: DC
  Administered 2015-06-29 – 2015-07-01 (×5): 75 mg via ORAL
  Filled 2015-06-29 (×5): qty 1

## 2015-06-29 MED ORDER — BISACODYL 5 MG PO TBEC: 10.0000 mg | DELAYED_RELEASE_TABLET | Freq: Every day | ORAL | Status: DC | PRN

## 2015-06-29 MED ORDER — DULOXETINE HCL 60 MG PO CPEP
60.0000 mg | ORAL_CAPSULE | Freq: Every day | ORAL | Status: DC
  Administered 2015-06-29 – 2015-07-01 (×3): 60 mg via ORAL
  Filled 2015-06-29: qty 1
  Filled 2015-06-29: qty 2
  Filled 2015-06-29: qty 1
  Filled 2015-06-29: qty 2

## 2015-06-29 NOTE — ED Notes (Signed)
Dr. Rex Kras made aware of pt's BP.

## 2015-06-29 NOTE — Consult Note (Signed)
Patient ID: Ariana [name] MRN: [id number] DOB/AGE: [birth year] 82 y.o.  Admit date: 06/29/2015 Referring Physician: Mesner Primary Cardiologist: Hilty Reason for Consultation: Bradycardia  HPI: 82 yo female with dementia, paroxysmal atrial fibrillation, HTN and prior CVA who presented to [facility] ED today with complaints of weakness. She has known PAF and is on Metoprolol at home. She has been found to have a UTI. She is bradycardic on arrival. EKG shows sinus bradycardia. She was given a fluid bolus and HR now in the 50s. She denies chest pain and SOB. Last echo June 2016 with normal LV systolic function, mild valve disease. She is here today with her daughter. She denies any recent near syncopal or syncopal events. Today she was actually working with her therapist who noted her low heart rate.    Past Medical History  Diagnosis Date  . Sjogren's syndrome ([place])   . Dry eye syndrome   . Hypertension, benign   . Mitral valve prolapse   . GERD (gastroesophageal reflux disease)   . Diverticulosis of colon   . Irritable bowel syndrome   . Urinary incontinence   . Low back pain syndrome   . Fibromyalgia   . Memory loss   . Anxiety and depression   . History of adverse drug reaction   . Peripheral neuropathy (HCC)     "both feet and legs"  . Shortness of breath 07/18/11    "alot lately"  . Anginal pain ([place])   . History of recurrent UTIs   . H/O hiatal hernia   . Anxiety   . Dementia without behavioral disturbance   . Depression   . Abnormality of gait   . Thyroid nodule   . Headache(784.0) 09/05/2012  . Adenomatous polyp of colon 2002    59mm  . History of cerebrovascular disease 09/24/2014  . Physical deconditioning   . Paroxysmal a-fib ([place])   . Therapeutic opioid-induced constipation (OIC)   . Dysthymic disorder   . Chronic back pain   . Dysphagia   . Dry mouth   . Allergic rhinitis     Family History  Problem Relation Age of Onset  . Heart disease Father    heart attack  . Pneumonia Mother   . Heart attack Mother   . Hypertension Mother   . Hypertension Maternal Grandmother   . Colon cancer Sister   . Kidney disease Daughter   . Asthma Daughter   . Arthritis Daughter 62    osteo,  . Heart disease Son 64    stage 3 CHF(Diastolic /Systolic)  . Throat cancer Brother     Social History   Social History  . Marital Status: Widowed    Spouse Name: N/A  . Number of Children: 2  . Years of Education: 12   Occupational History  . Retired    Social History Main Topics  . Smoking status: Former Research scientist (life sciences)  . Smokeless tobacco: Never Used     Comment: Quit at age 68   . Alcohol Use: No  . Drug Use: No  . Sexual Activity: No   Other Topics Concern  . 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    Patient is Widowed.    Patient has 2 children.    Patient is retired.    Former smoker   Alcohol none   Exercise Walk, exercise chair 4 days a week   POA  Walks with cane      Patient drinks about 1-2 cups of hot tea daily.   Patient is right handed.                   Past Surgical History  Procedure Laterality Date  . Vesicovaginal fistula closure w/ tah    . Appendectomy    . Cholecystectomy  2000  . Mandible surgery    . Temporomandibular joint surgery  1986    Dr. Terence Lux  . Cataract extraction, bilateral    . Abdominal hysterectomy  1967  . Dental surgery      multiple tooth extractions  . Esophagogastroduodenoscopy (egd) with esophageal dilation N/A 08/23/2012    Procedure: ESOPHAGOGASTRODUODENOSCOPY (EGD) WITH ESOPHAGEAL DILATION;  Surgeon: Milus Banister, MD;  Location: [facility];  Service: Endoscopy;  Laterality: N/A;  . Colonoscopy w/ biopsies      multiple  . Nasal septum surgery  1980  . Transthoracic echocardiogram  2001    mild LVH, normal LV  . Nm myocar perf wall motion  2003    persantine - normal static and dynamic study w/apical thinning and  presvered LV function, no ischemia  . Cardiac catheterization  02/17/2003    normal L main, LAD free of disease, Cfx free of disease, RCA free of disease (Dr. Loni Muse. Little)    Allergies  Allergen Reactions  . Banana Nausea And Vomiting  . Codeine Nausea Only    unless given with Phenergan  . Klonopin [Clonazepam] Other (See Comments)    Causes hallucination   . Meperidine Hcl Nausea Only    unless given with Phenergan  . Norflex [Orphenadrine Citrate] Nausea Only    Unless given with Phenergan  . Oxycodone-Acetaminophen Nausea Only    unless given with phenergan  . Propoxyphene Hcl Nausea Only    unless given with phenergan  . Zoloft [Sertraline Hcl] Other (See Comments)    Caused lethargy  . Doxycycline Other (See Comments)    Unknown  . Naproxen Other (See Comments)  . Penicillins Other (See Comments)    Unknown  . Phenothiazines Other (See Comments)    Unknown  . Stelazine Other (See Comments)    Unknown  . Sulfamethoxazole-Trimethoprim Other (See Comments)    Unknown  . Tolectin [Tolmetin Sodium] Other (See Comments)    Unknown  . Tramadol Other (See Comments)    Unknown    Prior to Admission medications   Medication Sig Start Date End Date Taking? Authorizing Provider  ALPRAZolam Duanne Moron) 0.5 MG tablet Take 1 tablet (0.5 mg total) by mouth 2 (two) times daily as needed for anxiety. Patient taking differently: Take 0.5 mg by mouth 2 (two) times daily.  04/20/15  Yes Orson Eva, MD  aspirin 325 MG tablet Take 1 tablet (325 mg total) by mouth daily. 07/28/14  Yes Reyne Dumas, MD  Diphenhyd-Hydrocort-Nystatin (FIRST-DUKES MOUTHWASH) SUSP 1 tsp garlge and swallow four times daily 06/03/15  Yes Tiffany L Reed, DO  DULoxetine (CYMBALTA) 30 MG capsule Take one tablet  once daily in the morning for depression 04/07/15  Yes Kathrynn Ducking, MD  DULoxetine (CYMBALTA) 60 MG capsule Take one tablet by mouth in the evening once daily for depression 04/07/15  Yes Kathrynn Ducking, MD    HYDROcodone-acetaminophen (NORCO/VICODIN) 5-325 MG tablet Take 1 tablet by mouth every 6 (six) hours as needed for moderate pain. Patient taking differently: Take 1 tablet by mouth every 6 (six) hours as needed for moderate pain or severe  pain.  06/03/15  Yes Tiffany L Reed, DO  hydroxychloroquine (PLAQUENIL) 200 MG tablet Take 200 mg by mouth 2 (two) times daily. Take one tablet twice daily Monday thru Friday, only   Yes Historical Provider, MD  lubiprostone (AMITIZA) 24 MCG capsule Take 1 capsule (24 mcg total) by mouth 2 (two) times daily with a meal. 07/08/14  Yes Gatha Mayer, MD  LYRICA 75 MG capsule TAKE ONE CAPSULE BY MOUTH TWICE A DAY 02/05/15  Yes Tiffany L Reed, DO  memantine (NAMENDA) 10 MG tablet Take 1 tablet (10 mg total) by mouth 2 (two) times daily. 04/07/15  Yes Kathrynn Ducking, MD  metoprolol (LOPRESSOR) 50 MG tablet Take 1 tablet (50 mg total) by mouth 2 (two) times daily. 01/04/15  Yes Pixie Casino, MD  mirabegron ER (MYRBETRIQ) 25 MG TB24 tablet Take 1 tablet (25 mg total) by mouth daily. 01/04/15  Yes Tiffany L Reed, DO  omeprazole (PRILOSEC) 20 MG capsule Take 20 mg by mouth daily.   Yes Historical Provider, MD  pilocarpine (SALAGEN) 5 MG tablet Take 5 mg by mouth 2 (two) times daily.   Yes Historical Provider, MD  sodium chloride (OCEAN) 0.65 % SOLN nasal spray Place 1 spray into the nose as needed for congestion. 08/23/12  Yes Modena Jansky, MD  bisacodyl (DULCOLAX) 5 MG EC tablet Take 10 mg by mouth daily as needed for moderate constipation.    Historical Provider, MD  calcium carbonate (TUMS - DOSED IN MG ELEMENTAL CALCIUM) 500 MG chewable tablet Chew 1 tablet by mouth daily.    Historical Provider, MD  carboxymethylcellulose (REFRESH TEARS) 0.5 % SOLN Place 1 drop into both eyes 2 (two) times daily as needed (dry eyes).     Historical Provider, MD  docusate sodium (COLACE) 100 MG capsule Take 100 mg by mouth daily as needed for mild constipation.    Historical  Provider, MD  fluticasone-salmeterol (ADVAIR HFA) [phone] MCG/ACT inhaler Inhale 2 puffs into the lungs 2 (two) times daily.    Historical Provider, MD  magic mouthwash w/lidocaine SOLN Take 5 mLs by mouth 4 (four) times daily.    Historical Provider, MD  senna (SENOKOT) 8.6 MG TABS tablet Take 2 tablets (17.2 mg total) by mouth daily. Patient taking differently: Take 2 tablets by mouth daily as needed for mild constipation or moderate constipation.  04/20/15   Orson Eva, MD    Review of systems complete and found to be negative unless listed above    Physical Exam: Blood pressure 120/53, pulse 54, temperature 97.8 F (36.6 C), temperature source Oral, resp. rate 12, SpO2 96 %.    General: Elderly female, NAD. Awake and alert. Answers questions appropriately.  HEENT: OP clear, mucus membranes moist  SKIN: warm, dry. No rashes.  Neuro: No focal deficits  Musculoskeletal: Muscle strength 5/5 all ext  Psychiatric: Mood and affect normal  Neck: No JVD, no carotid bruits, no thyromegaly, no lymphadenopathy.  Lungs:Clear bilaterally, no wheezes, rhonci, crackles  Cardiovascular: Loletha Grayer, regular. No murmurs, gallops or rubs.  Abdomen:Soft. Bowel sounds present. Non-tender.  Extremities: No lower extremity edema. Pulses are 2 + in the bilateral DP/PT.   Labs:   Lab Results  Component Value Date   WBC 6.4 06/29/2015   HGB 13.4 06/29/2015   HCT 40.3 06/29/2015   MCV 89.4 06/29/2015   PLT 203 06/29/2015    Recent Labs Lab 06/29/15 1556  NA 140  K 4.2  CL 106  CO2 26  BUN  29*  CREATININE 0.75  CALCIUM 9.9  PROT 7.3  BILITOT 0.5  ALKPHOS 94  ALT 23  AST 23  GLUCOSE 133*    EKG:  Sinus brady, rate 40 bpm. PACs.   ASSESSMENT AND PLAN:   1. Bradycardia: Pt has known PAF on beta blocker at home. Now presenting with UTI, weakness and bradycardia. This appears to be sinus bradycardia although while watching telemetry she had a few dropped beats. Cannot exclude second degree AV  block. Would hold metoprolol. Follow closely on telemetry. Medicine team to admit. She has been deemed to be a poor candidate for anti-coagulation with her atrial fib in the past due to dementia. I am not sure she would be a candidate for a pacemaker given her low functional state and dementia but if her bradycardia does not resolve off of the beta blocker, we could get EP involved. Electrolytes are ok.   2. Paroxysmal atrial fibrillation: She is in sinus today. Beta blocker will be held. She has not been started on anti-coagulation because of dementia. She is followed by Dr. Debara Pickett.    Signed: Lauree Chandler, MD 06/29/2015, 5:34 PM

## 2015-06-29 NOTE — ED Notes (Signed)
ICU/SD Charge RN called and 20 minute timer started.

## 2015-06-29 NOTE — ED Notes (Signed)
Pt's daughter and CNA report that the Pt's BP and heart rate were low earlier, during home therapy.  Pt has a history of chronic UTIs.  Also, CNA reports intermittent slurred speech since yesterday(5/15).

## 2015-06-29 NOTE — ED Provider Notes (Signed)
CSN: [id number]     Arrival date & time 06/29/15  1525 History   First MD Initiated Contact with Patient 06/29/15 1536     Chief Complaint  Patient presents with  . Bradycardia  . Palpitations     (Consider location/radiation/quality/duration/timing/severity/associated sxs/prior Treatment) Patient is a 82 y.o. female presenting with weakness.  Weakness This is a new problem. The current episode started less than 1 hour ago. The problem occurs constantly. The problem has not changed since onset.Pertinent negatives include no chest pain and no shortness of breath. Nothing aggravates the symptoms. Nothing relieves the symptoms. She has tried nothing for the symptoms. The treatment provided no relief.    Past Medical History  Diagnosis Date  . Sjogren's syndrome ([place])   . Dry eye syndrome   . Hypertension, benign   . Mitral valve prolapse   . GERD (gastroesophageal reflux disease)   . Diverticulosis of colon   . Irritable bowel syndrome   . Urinary incontinence   . Low back pain syndrome   . Fibromyalgia   . Memory loss   . Anxiety and depression   . History of adverse drug reaction   . Peripheral neuropathy (HCC)     "both feet and legs"  . Shortness of breath 07/18/11    "alot lately"  . Anginal pain ([place])   . History of recurrent UTIs   . H/O hiatal hernia   . Anxiety   . Dementia without behavioral disturbance   . Depression   . Abnormality of gait   . Thyroid nodule   . Headache(784.0) 09/05/2012  . Adenomatous polyp of colon 2002    64mm  . History of cerebrovascular disease 09/24/2014  . Physical deconditioning   . Paroxysmal a-fib ([place])   . Therapeutic opioid-induced constipation (OIC)   . Dysthymic disorder   . Chronic back pain   . Dysphagia   . Dry mouth   . Allergic rhinitis    Past Surgical History  Procedure Laterality Date  . Vesicovaginal fistula closure w/ tah    . Appendectomy    . Cholecystectomy  2000  . Mandible surgery    . Temporomandibular  joint surgery  1986    Dr. Terence Lux  . Cataract extraction, bilateral    . Abdominal hysterectomy  1967  . Dental surgery      multiple tooth extractions  . Esophagogastroduodenoscopy (egd) with esophageal dilation N/A 08/23/2012    Procedure: ESOPHAGOGASTRODUODENOSCOPY (EGD) WITH ESOPHAGEAL DILATION;  Surgeon: Milus Banister, MD;  Location: [facility];  Service: Endoscopy;  Laterality: N/A;  . Colonoscopy w/ biopsies      multiple  . Nasal septum surgery  1980  . Transthoracic echocardiogram  2001    mild LVH, normal LV  . Nm myocar perf wall motion  2003    persantine - normal static and dynamic study w/apical thinning and presvered LV function, no ischemia  . Cardiac catheterization  02/17/2003    normal L main, LAD free of disease, Cfx free of disease, RCA free of disease (Dr. Rockne Menghini)   Family History  Problem Relation Age of Onset  . Heart disease Father     heart attack  . Pneumonia Mother   . Heart attack Mother   . Hypertension Mother   . Hypertension Maternal Grandmother   . Colon cancer Sister   . Kidney disease Daughter   . Asthma Daughter   . Arthritis Daughter 21    osteo,  . Heart disease Son 11  stage 3 CHF(Diastolic /Systolic)  . Throat cancer Brother    Social History  Substance Use Topics  . Smoking status: Former Research scientist (life sciences)  . Smokeless tobacco: Never Used     Comment: Quit at age 53   . Alcohol Use: No   OB History    No data available     Review of Systems  Constitutional: Negative for fever and chills.  Eyes: Negative for pain and itching.  Respiratory: Negative for shortness of breath.   Cardiovascular: Negative for chest pain.  Endocrine: Negative for polydipsia and polyuria.  Genitourinary: Positive for decreased urine volume.  Neurological: Positive for weakness.  All other systems reviewed and are negative.     Allergies  Banana; Codeine; Klonopin; Meperidine hcl; Norflex; Oxycodone-acetaminophen; Propoxyphene hcl; Zoloft;  Doxycycline; Naproxen; Penicillins; Phenothiazines; Stelazine; Sulfamethoxazole-trimethoprim; Tolectin; and Tramadol  Home Medications   Prior to Admission medications   Medication Sig Start Date End Date Taking? Authorizing Provider  ALPRAZolam Duanne Moron) 0.5 MG tablet Take 1 tablet (0.5 mg total) by mouth 2 (two) times daily as needed for anxiety. Patient taking differently: Take 0.5 mg by mouth 2 (two) times daily.  04/20/15  Yes Orson Eva, MD  aspirin 325 MG tablet Take 1 tablet (325 mg total) by mouth daily. 07/28/14  Yes Reyne Dumas, MD  Diphenhyd-Hydrocort-Nystatin (FIRST-DUKES MOUTHWASH) SUSP 1 tsp garlge and swallow four times daily 06/03/15  Yes Tiffany L Reed, DO  DULoxetine (CYMBALTA) 30 MG capsule Take one tablet  once daily in the morning for depression 04/07/15  Yes Kathrynn Ducking, MD  DULoxetine (CYMBALTA) 60 MG capsule Take one tablet by mouth in the evening once daily for depression 04/07/15  Yes Kathrynn Ducking, MD  HYDROcodone-acetaminophen (NORCO/VICODIN) 5-325 MG tablet Take 1 tablet by mouth every 6 (six) hours as needed for moderate pain. Patient taking differently: Take 1 tablet by mouth every 6 (six) hours as needed for moderate pain or severe pain.  06/03/15  Yes Tiffany L Reed, DO  hydroxychloroquine (PLAQUENIL) 200 MG tablet Take 200 mg by mouth 2 (two) times daily. Take one tablet twice daily Monday thru Friday, only   Yes Historical Provider, MD  lubiprostone (AMITIZA) 24 MCG capsule Take 1 capsule (24 mcg total) by mouth 2 (two) times daily with a meal. 07/08/14  Yes Gatha Mayer, MD  LYRICA 75 MG capsule TAKE ONE CAPSULE BY MOUTH TWICE A DAY 02/05/15  Yes Tiffany L Reed, DO  memantine (NAMENDA) 10 MG tablet Take 1 tablet (10 mg total) by mouth 2 (two) times daily. 04/07/15  Yes Kathrynn Ducking, MD  metoprolol (LOPRESSOR) 50 MG tablet Take 1 tablet (50 mg total) by mouth 2 (two) times daily. 01/04/15  Yes Pixie Casino, MD  mirabegron ER (MYRBETRIQ) 25 MG TB24 tablet  Take 1 tablet (25 mg total) by mouth daily. 01/04/15  Yes Tiffany L Reed, DO  omeprazole (PRILOSEC) 20 MG capsule Take 20 mg by mouth daily.   Yes Historical Provider, MD  pilocarpine (SALAGEN) 5 MG tablet Take 5 mg by mouth 2 (two) times daily.   Yes Historical Provider, MD  sodium chloride (OCEAN) 0.65 % SOLN nasal spray Place 1 spray into the nose as needed for congestion. 08/23/12  Yes Modena Jansky, MD  bisacodyl (DULCOLAX) 5 MG EC tablet Take 10 mg by mouth daily as needed for moderate constipation.    Historical Provider, MD  calcium carbonate (TUMS - DOSED IN MG ELEMENTAL CALCIUM) 500 MG chewable tablet Chew 1 tablet  by mouth daily.    Historical Provider, MD  carboxymethylcellulose (REFRESH TEARS) 0.5 % SOLN Place 1 drop into both eyes 2 (two) times daily as needed (dry eyes).     Historical Provider, MD  docusate sodium (COLACE) 100 MG capsule Take 100 mg by mouth daily as needed for mild constipation.    Historical Provider, MD  fluticasone-salmeterol (ADVAIR HFA) [phone] MCG/ACT inhaler Inhale 2 puffs into the lungs 2 (two) times daily.    Historical Provider, MD  magic mouthwash w/lidocaine SOLN Take 5 mLs by mouth 4 (four) times daily.    Historical Provider, MD  senna (SENOKOT) 8.6 MG TABS tablet Take 2 tablets (17.2 mg total) by mouth daily. Patient taking differently: Take 2 tablets by mouth daily as needed for mild constipation or moderate constipation.  04/20/15   Orson Eva, MD   BP 120/53 mmHg  Pulse 54  Temp(Src) 97.8 F (36.6 C) (Oral)  Resp 12  SpO2 96% Physical Exam  Constitutional: She is oriented to person, place, and time. She appears well-developed and well-nourished.  HENT:  Head: Normocephalic and atraumatic.  Neck: Normal range of motion.  Cardiovascular: An irregular rhythm present. Bradycardia present.   Pulmonary/Chest: Effort normal and breath sounds normal. No stridor. No respiratory distress.  Abdominal: Soft. Bowel sounds are normal. She exhibits no  distension.  Musculoskeletal: Normal range of motion. She exhibits no edema or tenderness.  Neurological: She is alert and oriented to person, place, and time. No cranial nerve deficit.  Skin: Skin is warm and dry. No rash noted. No erythema.  Nursing note and vitals reviewed.   ED Course  Procedures (including critical care time) Labs Review Labs Reviewed  COMPREHENSIVE METABOLIC PANEL - Abnormal; Notable for the following:    Glucose, Bld 133 (*)    BUN 29 (*)    All other components within normal limits  URINALYSIS, ROUTINE W REFLEX MICROSCOPIC (NOT AT [facility]) - Abnormal; Notable for the following:    APPearance CLOUDY (*)    Leukocytes, UA MODERATE (*)    All other components within normal limits  URINE MICROSCOPIC-ADD ON - Abnormal; Notable for the following:    Squamous Epithelial / LPF 6-30 (*)    Bacteria, UA MANY (*)    All other components within normal limits  CBC WITH DIFFERENTIAL/PLATELET  I-STAT CG4 LACTIC ACID, ED    Imaging Review Dg Chest 2 View  06/29/2015  CLINICAL DATA:  Pneumonia. EXAM: CHEST  2 VIEW COMPARISON:  May 25, 2015. FINDINGS: The heart size and mediastinal contours are within normal limits. Both lungs are clear. No pneumothorax or pleural effusion is noted. The visualized skeletal structures are unremarkable. IMPRESSION: No active cardiopulmonary disease. Electronically Signed   By: Marijo Conception, M.D.   On: 06/29/2015 17:02   I have personally reviewed and evaluated these images and lab results as part of my medical decision-making.   EKG Interpretation   Date/Time:  Tuesday Jun 29 2015 15:48:51 EDT Ventricular Rate:  40 PR Interval:  64 QRS Duration: 110 QT Interval:  489 QTC Calculation: 399 R Axis:   -48 Text Interpretation:  Sinus bradycardia Atrial premature complexes Short  PR interval Probable left atrial enlargement LAD, consider left anterior  fascicular block Confirmed by Jocilynn Grade MD, Corene Cornea [phone]) on 06/29/2015  4:07:04 PM       MDM   Final diagnoses:  Paroxysmal atrial fibrillation (HCC)  Dehydration  UTI (lower urinary tract infection)  Bradycardia    Weakness and hypotension this  morning likely 2/2 UTI. Also with bradycardia to mid 30's. On metoprolol, will hold that. ecg looks like second degree type I block. Rocephin and fluids given, HR improved to 50's. D/w cardiology who will see patient, otherwise will admit to medicine.     Merrily Pew, MD 06/30/15 [phone]

## 2015-06-29 NOTE — ED Notes (Signed)
Pt presents with c/o hypotension. Per the family, pt's BP was low at the house and was different when changing positions. Upon arrival, pt is noted to be bradycardic at 35. Family reports that pt is usually bradycardic when her potassium is low.

## 2015-06-29 NOTE — ED Notes (Addendum)
Cardiology MD at bedside.

## 2015-06-29 NOTE — ED Notes (Signed)
Hospitalist MD at bedside. 

## 2015-06-29 NOTE — ED Notes (Signed)
Pt's daughter and CNA educated on the importance of wiping from front to back, not leaving the Pt in a wet brief, and giving the Pt 100% Cranberry juice and not the Cranberry cocktail.

## 2015-06-29 NOTE — H&P (Signed)
History and Physical    Ariana [name] [id number] DOB: [birth year] DOA: 06/29/2015  PCP: Hollace Kinnier, DO  Patient coming from: Home.  Chief Complaint: Low blood pressure.  HPI: Ariana [name] is a 82 y.o. female with medical history significant of Sjogren's syndrome, dry eye syndrome, hypertension, MVP, GERD, diverticulosis, IBS, chronic constipation, chronic back pain, fibromyalgia, dementia, peripheral neuropathy, depression and anxiety, paroxysmal atrial fibrillation brought to the emergency department after family reported persistent hypotension at home.  Per patient's daughter, she was feeling weak earlier in the day. When they took her blood pressure, the systolic BP was persistently in the 90s and the patient's heart rate was bradycardic in the 30s. Her daughter states that she has had similar episodes of bradycardia when she gets UTIs and/or hypokalemia. She called the patient's PCP, who recommended for the patient to go see her at the office or come to the ED for further evaluation, so they decided to come directly to the ED.. They denied fever, chills, but the patient feels fatigue. She complains of mild dysuria and chronic constipation.  ED Course: Heart rate was initially in the 30s in the emergency department, but now being in the high 40s to mid 50s after IV fluids and IV antibiotics were given. Her workup shows pyuria on urinalysis.   Review of Systems: As per HPI otherwise 10 point review of systems negative.    Past Medical History  Diagnosis Date  . Sjogren's syndrome ([place])   . Dry eye syndrome   . Hypertension, benign   . Mitral valve prolapse   . GERD (gastroesophageal reflux disease)   . Diverticulosis of colon   . Irritable bowel syndrome   . Urinary incontinence   . Low back pain syndrome   . Fibromyalgia   . Memory loss   . Anxiety and depression   . History of adverse drug reaction   . Peripheral neuropathy (HCC)     "both feet and legs"  . Shortness  of breath 07/18/11    "alot lately"  . Anginal pain ([place])   . History of recurrent UTIs   . H/O hiatal hernia   . Anxiety   . Dementia without behavioral disturbance   . Depression   . Abnormality of gait   . Thyroid nodule   . Headache(784.0) 09/05/2012  . Adenomatous polyp of colon 2002    57mm  . History of cerebrovascular disease 09/24/2014  . Physical deconditioning   . Paroxysmal a-fib ([place])   . Therapeutic opioid-induced constipation (OIC)   . Dysthymic disorder   . Chronic back pain   . Dysphagia   . Dry mouth   . Allergic rhinitis     Past Surgical History  Procedure Laterality Date  . Vesicovaginal fistula closure w/ tah    . Appendectomy    . Cholecystectomy  2000  . Mandible surgery    . Temporomandibular joint surgery  1986    Dr. Terence Lux  . Cataract extraction, bilateral    . Abdominal hysterectomy  1967  . Dental surgery      multiple tooth extractions  . Esophagogastroduodenoscopy (egd) with esophageal dilation N/A 08/23/2012    Procedure: ESOPHAGOGASTRODUODENOSCOPY (EGD) WITH ESOPHAGEAL DILATION;  Surgeon: Milus Banister, MD;  Location: [facility] ENDOSCOPY;  Service: Endoscopy;  Laterality: N/A;  . Colonoscopy w/ biopsies      multiple  . Nasal septum surgery  1980  . Transthoracic echocardiogram  2001    mild LVH, normal LV  .  Nm myocar perf wall motion  2003    persantine - normal static and dynamic study w/apical thinning and presvered LV function, no ischemia  . Cardiac catheterization  02/17/2003    normal L main, LAD free of disease, Cfx free of disease, RCA free of disease (Dr. Rockne Menghini)     reports that she has quit smoking. She has never used smokeless tobacco. She reports that she does not drink alcohol or use illicit drugs.  Allergies  Allergen Reactions  . Banana Nausea And Vomiting  . Codeine Nausea Only    unless given with Phenergan  . Klonopin [Clonazepam] Other (See Comments)    Causes hallucination   . Meperidine Hcl Nausea Only     unless given with Phenergan  . Norflex [Orphenadrine Citrate] Nausea Only    Unless given with Phenergan  . Oxycodone-Acetaminophen Nausea Only    unless given with phenergan  . Propoxyphene Hcl Nausea Only    unless given with phenergan  . Zoloft [Sertraline Hcl] Other (See Comments)    Caused lethargy  . Doxycycline Other (See Comments)    Unknown  . Naproxen Other (See Comments)  . Penicillins Other (See Comments)    Unknown  . Phenothiazines Other (See Comments)    Unknown  . Stelazine Other (See Comments)    Unknown  . Sulfamethoxazole-Trimethoprim Other (See Comments)    Unknown  . Tolectin [Tolmetin Sodium] Other (See Comments)    Unknown  . Tramadol Other (See Comments)    Unknown    Family History  Problem Relation Age of Onset  . Heart disease Father     heart attack  . Pneumonia Mother   . Heart attack Mother   . Hypertension Mother   . Hypertension Maternal Grandmother   . Colon cancer Sister   . Kidney disease Daughter   . Asthma Daughter   . Arthritis Daughter 65    osteo,  . Heart disease Son 31    stage 3 CHF(Diastolic /Systolic)  . Throat cancer Brother      Prior to Admission medications   Medication Sig Start Date End Date Taking? Authorizing Provider  ALPRAZolam Duanne Moron) 0.5 MG tablet Take 1 tablet (0.5 mg total) by mouth 2 (two) times daily as needed for anxiety. Patient taking differently: Take 0.5 mg by mouth 2 (two) times daily.  04/20/15  Yes Orson Eva, MD  aspirin 325 MG tablet Take 1 tablet (325 mg total) by mouth daily. 07/28/14  Yes Reyne Dumas, MD  Diphenhyd-Hydrocort-Nystatin (FIRST-DUKES MOUTHWASH) SUSP 1 tsp garlge and swallow four times daily 06/03/15  Yes Tiffany L Reed, DO  DULoxetine (CYMBALTA) 30 MG capsule Take one tablet  once daily in the morning for depression 04/07/15  Yes Kathrynn Ducking, MD  DULoxetine (CYMBALTA) 60 MG capsule Take one tablet by mouth in the evening once daily for depression 04/07/15  Yes Kathrynn Ducking, MD   HYDROcodone-acetaminophen (NORCO/VICODIN) 5-325 MG tablet Take 1 tablet by mouth every 6 (six) hours as needed for moderate pain. Patient taking differently: Take 1 tablet by mouth every 6 (six) hours as needed for moderate pain or severe pain.  06/03/15  Yes Tiffany L Reed, DO  hydroxychloroquine (PLAQUENIL) 200 MG tablet Take 200 mg by mouth 2 (two) times daily. Take one tablet twice daily Monday thru Friday, only   Yes Historical Provider, MD  lubiprostone (AMITIZA) 24 MCG capsule Take 1 capsule (24 mcg total) by mouth 2 (two) times daily with a meal. 07/08/14  Yes Gatha Mayer, MD  LYRICA 75 MG capsule TAKE ONE CAPSULE BY MOUTH TWICE A DAY 02/05/15  Yes Tiffany L Reed, DO  memantine (NAMENDA) 10 MG tablet Take 1 tablet (10 mg total) by mouth 2 (two) times daily. 04/07/15  Yes Kathrynn Ducking, MD  metoprolol (LOPRESSOR) 50 MG tablet Take 1 tablet (50 mg total) by mouth 2 (two) times daily. 01/04/15  Yes Pixie Casino, MD  mirabegron ER (MYRBETRIQ) 25 MG TB24 tablet Take 1 tablet (25 mg total) by mouth daily. 01/04/15  Yes Tiffany L Reed, DO  omeprazole (PRILOSEC) 20 MG capsule Take 20 mg by mouth daily.   Yes Historical Provider, MD  pilocarpine (SALAGEN) 5 MG tablet Take 5 mg by mouth 2 (two) times daily.   Yes Historical Provider, MD  sodium chloride (OCEAN) 0.65 % SOLN nasal spray Place 1 spray into the nose as needed for congestion. 08/23/12  Yes Modena Jansky, MD  bisacodyl (DULCOLAX) 5 MG EC tablet Take 10 mg by mouth daily as needed for moderate constipation.    Historical Provider, MD  calcium carbonate (TUMS - DOSED IN MG ELEMENTAL CALCIUM) 500 MG chewable tablet Chew 1 tablet by mouth daily.    Historical Provider, MD  carboxymethylcellulose (REFRESH TEARS) 0.5 % SOLN Place 1 drop into both eyes 2 (two) times daily as needed (dry eyes).     Historical Provider, MD  docusate sodium (COLACE) 100 MG capsule Take 100 mg by mouth daily as needed for mild constipation.    Historical  Provider, MD  fluticasone-salmeterol (ADVAIR HFA) [phone] MCG/ACT inhaler Inhale 2 puffs into the lungs 2 (two) times daily.    Historical Provider, MD  magic mouthwash w/lidocaine SOLN Take 5 mLs by mouth 4 (four) times daily.    Historical Provider, MD  senna (SENOKOT) 8.6 MG TABS tablet Take 2 tablets (17.2 mg total) by mouth daily. Patient taking differently: Take 2 tablets by mouth daily as needed for mild constipation or moderate constipation.  04/20/15   Orson Eva, MD    Physical Exam: Filed Vitals:   06/29/15 1600 06/29/15 1630 06/29/15 1733 06/29/15 1800  BP: 121/57 120/53 132/65 114/55  Pulse: 36 54  40  Temp:      TempSrc:      Resp: 22 12  13   SpO2: 94% 96%  97%      Constitutional: NAD, calm, comfortable Filed Vitals:   06/29/15 1600 06/29/15 1630 06/29/15 1733 06/29/15 1800  BP: 121/57 120/53 132/65 114/55  Pulse: 36 54  40  Temp:      TempSrc:      Resp: 22 12  13   SpO2: 94% 96%  97%   Eyes: PERRL, lids and conjunctivae normal ENMT: Mucous membranes are mildly dry. Posterior pharynx clear of any exudate or lesions.Normal dentition.  Neck: normal, supple, no masses, no thyromegaly Respiratory: clear to auscultation bilaterally, no wheezing, no crackles on anterior exam. Normal respiratory effort. No accessory muscle use.  Cardiovascular: Bradycardic at 56 BPM, Regular rhythm, no murmurs / rubs / gallops. No extremity edema. 2+ pedal pulses. No carotid bruits.  Abdomen: Mildly distended, positive surgical scars, Bowel sounds positive. No tenderness, no masses palpated. No hepatosplenomegaly. Musculoskeletal: no clubbing / cyanosis. No joint deformity upper and lower extremities. Good ROM, no contractures. Mildly decreased muscle tone.  Skin: No induration/ rashes/lesions, ulcers on limited skin exam of extremities/ anterior trunk      Neurologic: CN 2-12 grossly intact. Sensation intact, global weakness without focal  deficits. Psychiatric: Normal judgment and insight.  Alert and oriented x person, place, situation, partially oriented to time. Normal mood.     Labs on Admission: I have personally reviewed following labs and imaging studies  CBC:  Recent Labs Lab 06/29/15 1556  WBC 6.4  NEUTROABS 3.1  HGB 13.4  HCT 40.3  MCV 89.4  PLT 123456   Basic Metabolic Panel:  Recent Labs Lab 06/29/15 1556  NA 140  K 4.2  CL 106  CO2 26  GLUCOSE 133*  BUN 29*  CREATININE 0.75  CALCIUM 9.9   GFR: CrCl cannot be calculated (Unknown ideal weight.). Liver Function Tests:  Recent Labs Lab 06/29/15 1556  AST 23  ALT 23  ALKPHOS 94  BILITOT 0.5  PROT 7.3  ALBUMIN 4.4   Urine analysis:    Component Value Date/Time   COLORURINE YELLOW 06/29/2015 1603   APPEARANCEUR CLOUDY* 06/29/2015 1603   LABSPEC 1.015 06/29/2015 1603   PHURINE 5.0 06/29/2015 1603   GLUCOSEU NEGATIVE 06/29/2015 1603   GLUCOSEU NEGATIVE 07/14/2011 1312   [place] 06/29/2015 1603   BILIRUBINUR NEGATIVE 06/29/2015 1603   BILIRUBINUR negative 05/21/2015 1442   [place] 06/29/2015 1603   PROTEINUR NEGATIVE 06/29/2015 1603   PROTEINUR trace 05/21/2015 1442   UROBILINOGEN negative 05/21/2015 1442   UROBILINOGEN 0.2 12/16/2014 1334   NITRITE NEGATIVE 06/29/2015 1603   NITRITE trace 05/21/2015 1442   LEUKOCYTESUR MODERATE* 06/29/2015 1603    Radiological Exams on Admission: Dg Chest 2 View  06/29/2015  CLINICAL DATA:  Pneumonia. EXAM: CHEST  2 VIEW COMPARISON:  May 25, 2015. FINDINGS: The heart size and mediastinal contours are within normal limits. Both lungs are clear. No pneumothorax or pleural effusion is noted. The visualized skeletal structures are unremarkable. IMPRESSION: No active cardiopulmonary disease. Electronically Signed   By: Marijo Conception, M.D.   On: 06/29/2015 17:02  Echocardiogram 07/27/2014  ------------------------------------------------------------------- LV EF: 55% -  60%  ------------------------------------------------------------------- Indications: CVA 436.  ------------------------------------------------------------------- History: PMH: MVP. Altered mental status. Atrial fibrillation.  ------------------------------------------------------------------- Study Conclusions  - Left ventricle: The cavity size was normal. Systolic function was  normal. The estimated ejection fraction was in the range of 55%  to 60%. Images were inadequate for LV wall motion assessment.  Features are consistent with a pseudonormal left ventricular  filling pattern, with concomitant abnormal relaxation and  increased filling pressure (grade 2 diastolic dysfunction). - Mitral valve: Calcified annulus. There was mild regurgitation. - Left atrium: The atrium was mildly dilated. - Tricuspid valve: There was trivial regurgitation.   EKG: Independently reviewed.  Vent. rate 40 BPM PR interval 64 ms QRS duration 110 ms QT/QTc 489/399 ms P-R-T axes 85 -48 78 Sinus bradycardia Atrial premature complexes Short PR interval Probable left atrial enlargement LAD, consider left anterior fascicular block  Assessment/Plan Principal problem:   UTI (lower urinary tract infection) Admit to stepdown for close monitoring. Continue supplemental oxygen. Continue gentle IV hydration. Rocephin 1 g every 24 hours IVPB Follow-up urine culture and sensitivity.   Active Problems:   Bradycardia Continue close cardiac monitoring. Hold metoprolol. Cardiology consult appreciated. Hold pilocarpine. Monitor electrolytes closely.    Paroxysmal atrial fibrillation (HCC)   CHA2DS2-VASc of at least 7 In sinus rhythm at this time.  Continue cardiac monitoring. Not a candidate for anticoagulation due to frequent falls. Monitor electrolytes closely.    Chronic lower back pain Limited used narcotics due to an earlier bradycardia and hypotension.   ANXIETY  DEPRESSION Continue daily duloxetine 90 mg  by mouth in a.m. Continue alprazolam as needed.    HYPERTENSION, BENIGN Hold antihypertensives for now due to hypotension earlier today. Continue gentle IV hydration with normal saline. Monitor blood pressure closely.    GERD Pantoprazole 40 mg by mouth daily.    Senile dementia Supportive care. Continue Namenda 10 mg by mouth twice a day.       DVT prophylaxis: Lovenox SQ. Code Status: Full code. Family Communication: Her daughter Celenia was present in the room. Disposition Plan: Admit for IV antibiotic therapy and cardiac monitoring. Consults called: Cardiology Burnell Blanks, MD) Admission status: Inpatient/stepdown.   Reubin Milan MD [facility] Pager [phone].  If 7PM-7AM, please contact night-coverage [email or web address] Password [facility]  06/29/2015, 6:47 PM

## 2015-06-30 DIAGNOSIS — I959 Hypotension, unspecified: Secondary | ICD-10-CM | POA: Insufficient documentation

## 2015-06-30 DIAGNOSIS — E860 Dehydration: Secondary | ICD-10-CM

## 2015-06-30 DIAGNOSIS — N390 Urinary tract infection, site not specified: Principal | ICD-10-CM

## 2015-06-30 DIAGNOSIS — K5900 Constipation, unspecified: Secondary | ICD-10-CM

## 2015-06-30 LAB — COMPREHENSIVE METABOLIC PANEL
ALBUMIN: 3.6 g/dL (ref 3.5–5.0)
ALK PHOS: 76 U/L (ref 38–126)
ALT: 19 U/L (ref 14–54)
ANION GAP: 5 (ref 5–15)
AST: 23 U/L (ref 15–41)
BILIRUBIN TOTAL: 0.4 mg/dL (ref 0.3–1.2)
BUN: 21 mg/dL — AB (ref 6–20)
CO2: 28 mmol/L (ref 22–32)
Calcium: 9 mg/dL (ref 8.9–10.3)
Chloride: 109 mmol/L (ref 101–111)
Creatinine, Ser: 0.65 mg/dL (ref 0.44–1.00)
GFR calc Af Amer: >60 mL/min (ref >60)
GFR calc non Af Amer: >60 mL/min (ref >60)
GLUCOSE: 100 mg/dL — AB (ref 65–99)
POTASSIUM: 4 mmol/L (ref 3.5–5.1)
Sodium: 142 mmol/L (ref 135–145)
TOTAL PROTEIN: 6.2 g/dL — AB (ref 6.5–8.1)

## 2015-06-30 LAB — CBC WITH DIFFERENTIAL/PLATELET
Basophils Absolute: 0 10*3/uL (ref 0.0–0.1)
Basophils Relative: 0 %
Eosinophils Absolute: 0.2 10*3/uL (ref 0.0–0.7)
Eosinophils Relative: 3 %
HEMATOCRIT: 35.4 % — AB (ref 36.0–46.0)
Hemoglobin: 11.7 g/dL — ABNORMAL LOW (ref 12.0–15.0)
LYMPHS ABS: 2 10*3/uL (ref 0.7–4.0)
LYMPHS PCT: 39 %
MCH: 30.2 pg (ref 26.0–34.0)
MCHC: 33.1 g/dL (ref 30.0–36.0)
MCV: 91.2 fL (ref 78.0–100.0)
MONO ABS: 0.7 10*3/uL (ref 0.1–1.0)
MONOS PCT: 13 %
NEUTROS ABS: 2.4 10*3/uL (ref 1.7–7.7)
Neutrophils Relative %: 45 %
Platelets: 170 10*3/uL (ref 150–400)
RBC: 3.88 MIL/uL (ref 3.87–5.11)
RDW: 13.7 % (ref 11.5–15.5)
WBC: 5.2 10*3/uL (ref 4.0–10.5)

## 2015-06-30 LAB — T4, FREE: FREE T4: 0.7 ng/dL (ref 0.61–1.12)

## 2015-06-30 LAB — TSH: TSH: 0.889 u[IU]/mL (ref 0.350–4.500)

## 2015-06-30 MED ORDER — MIRABEGRON ER 25 MG PO TB24
25.0000 mg | ORAL_TABLET | Freq: Every day | ORAL | Status: DC
  Administered 2015-06-30 – 2015-07-01 (×2): 25 mg via ORAL
  Filled 2015-06-30 (×3): qty 1

## 2015-06-30 MED ORDER — SENNA 8.6 MG PO TABS
2.0000 | ORAL_TABLET | Freq: Every day | ORAL | Status: DC
Start: 2015-06-30 — End: 2015-07-02
  Administered 2015-06-30 – 2015-07-01 (×2): 17.2 mg via ORAL
  Filled 2015-06-30 (×2): qty 2

## 2015-06-30 MED ORDER — PILOCARPINE HCL 5 MG PO TABS
5.0000 mg | ORAL_TABLET | Freq: Two times a day (BID) | ORAL | Status: DC
  Administered 2015-06-30 – 2015-07-01 (×4): 5 mg via ORAL
  Filled 2015-06-30 (×6): qty 1

## 2015-06-30 MED ORDER — MUPIROCIN 2 % EX OINT
1.0000 | TOPICAL_OINTMENT | Freq: Two times a day (BID) | CUTANEOUS | Status: DC
Start: 2015-06-30 — End: 2015-07-02
  Administered 2015-06-30 – 2015-07-01 (×4): 1 via NASAL
  Filled 2015-06-30: qty 22

## 2015-06-30 MED ORDER — CHLORHEXIDINE GLUCONATE CLOTH 2 % EX PADS
6.0000 | MEDICATED_PAD | Freq: Every day | CUTANEOUS | Status: DC
  Administered 2015-06-30: 6 via TOPICAL

## 2015-06-30 MED ORDER — HYDROXYCHLOROQUINE SULFATE 200 MG PO TABS
200.0000 mg | ORAL_TABLET | ORAL | Status: DC
  Administered 2015-06-30 – 2015-07-01 (×4): 200 mg via ORAL
  Filled 2015-06-30 (×6): qty 1

## 2015-06-30 MED ORDER — POLYVINYL ALCOHOL 1.4 % OP SOLN
1.0000 [drp] | Freq: Two times a day (BID) | OPHTHALMIC | Status: DC | PRN
  Filled 2015-06-30: qty 15

## 2015-06-30 NOTE — Progress Notes (Addendum)
PROGRESS NOTE    Ariana [name]  [id number] DOB: [birth year] DOA: 06/29/2015  PCP: Hollace Kinnier, DO     Brief Narrative:  Ariana [name] is a 82 y.o. female with medical history significant of Sjogren's syndrome, dry eye syndrome, hypertension, MVP, GERD, diverticulosis, IBS, chronic constipation, chronic back pain, fibromyalgia, dementia, peripheral neuropathy, depression and anxiety, paroxysmal atrial fibrillation. She has been feeling weak for a few days and when HR and BP were checked by her HHPT, she was told to go to the ER for low BP and HR. HR was in 30s SBP in 90s. Found to have a UA consistent with UTI. She is a poor historian and tells me she has been having dysuria on and off. Her daughter told there ER and admitting doctors that the patient becomes bradycardic when she has a UTI.   Subjective: See above. Continues to feel weak today. She mainly talks about her severe constipation despite using Amitiza and Miralax.   Assessment & Plan:   Principal Problem:   UTI (lower urinary tract infection)? - poor historian in regards to symptoms- cont Rocephin  Active Problems:   Bradycardia/ Hypotension - doubt this is from UTI- stopped Metoprolol- cardiology in agreement with this- they have seen her today  Grade 2 diastolic dysfunction - noted on echo on 6/16- holding B blocker as mentioned- watch for fluid over load  Dehydration - elevated BUN /Cr ratio- cont slow IVF- this may be the cause of her weakness along with bradycardia  Generalized weakness - likely from bradycardia/ hypotension/ dehydration - check TSH and F T4 - PT eval- already receiving home health PT  PAF- - currently in NSR - follow with BB on hold - not on anticoagulation     ANXIETY DEPRESSION - Xanax, Cymbalta    GERD - Protonix    Constipation - add daily senna   Sjogrens/ Dry eyes - Salagen and Plaquenil   DVT prophylaxis: Lovenox Code Status: full code Family Communication:    Disposition Plan: home tomorrow- PT eval Consultants:   cardiology Procedures:   none Antimicrobials:  Anti-infectives    Start     Dose/Rate Route Frequency Ordered Stop   06/30/15 1800  cefTRIAXone (ROCEPHIN) 1 g in dextrose 5 % 50 mL IVPB     1 g 100 mL/hr over 30 Minutes Intravenous Every 24 hours 06/29/15 2020     06/30/15 1000  hydroxychloroquine (PLAQUENIL) tablet 200 mg     200 mg Oral 2 times per day on Mon Tue Wed Thu Fri 06/30/15 0857     06/29/15 1645  cefTRIAXone (ROCEPHIN) 1 g in dextrose 5 % 50 mL IVPB     1 g 100 mL/hr over 30 Minutes Intravenous  Once 06/29/15 1635 06/29/15 1758       Objective: Filed Vitals:   06/30/15 0800 06/30/15 0900 06/30/15 1000 06/30/15 1110  BP: 142/58  159/68 146/60  Pulse: 55 63 63 65  Temp: 98.2 F (36.8 C)   98 F (36.7 C)  TempSrc: Oral   Oral  Resp: 14 12 19 18   Height:    5.3" (0.135 m)  Weight:    67 kg (147 lb 11.3 oz)  SpO2: 92% 98% 98% 100%    Intake/Output Summary (Last 24 hours) at 06/30/15 1135 Last data filed at 06/30/15 0800  Gross per 24 hour  Intake 856.25 ml  Output   1175 ml  Net -318.75 ml   Filed Weights   06/29/15 2100 06/30/15 1110  Weight: 68.5 kg (151 lb 0.2 oz) 67 kg (147 lb 11.3 oz)    Examination: General exam: Appears comfortable  HEENT: PERRLA, oral mucosa moist, no sclera icterus or thrush Respiratory system: Clear to auscultation. Respiratory effort normal. Cardiovascular system: S1 & S2 heard, RRR.  No murmurs  Gastrointestinal system: Abdomen soft, non-tender, nondistended. Normal bowel sound. No organomegaly Central nervous system: Alert and oriented. No focal neurological deficits. Extremities: No cyanosis, clubbing or edema Skin: No rashes or ulcers Psychiatry:  Mood & affect appropriate.     Data Reviewed: I have personally reviewed following labs and imaging studies  CBC:  Recent Labs Lab 06/29/15 1556 06/30/15 0325  WBC 6.4 5.2  NEUTROABS 3.1 2.4  HGB 13.4  11.7*  HCT 40.3 35.4*  MCV 89.4 91.2  PLT 203 [id number]   Basic Metabolic Panel:  Recent Labs Lab 06/29/15 1556 06/29/15 1600 06/30/15 0325  NA 140  --  142  K 4.2  --  4.0  CL 106  --  109  CO2 26  --  28  GLUCOSE 133*  --  100*  BUN 29*  --  21*  CREATININE 0.75  --  0.65  CALCIUM 9.9  --  9.0  MG  --  2.0  --   PHOS  --  3.5  --    GFR: CrCl cannot be calculated (Unknown ideal weight.). Liver Function Tests:  Recent Labs Lab 06/29/15 1556 06/30/15 0325  AST 23 23  ALT 23 19  ALKPHOS 94 76  BILITOT 0.5 0.4  PROT 7.3 6.2*  ALBUMIN 4.4 3.6   No results for input(s): LIPASE, AMYLASE in the last 168 hours. No results for input(s): AMMONIA in the last 168 hours. Coagulation Profile: No results for input(s): INR, PROTIME in the last 168 hours. Cardiac Enzymes: No results for input(s): CKTOTAL, CKMB, CKMBINDEX, TROPONINI in the last 168 hours. BNP (last 3 results) No results for input(s): PROBNP in the last 8760 hours. HbA1C: No results for input(s): HGBA1C in the last 72 hours. CBG: No results for input(s): GLUCAP in the last 168 hours. Lipid Profile: No results for input(s): CHOL, HDL, LDLCALC, TRIG, CHOLHDL, LDLDIRECT in the last 72 hours. Thyroid Function Tests: No results for input(s): TSH, T4TOTAL, FREET4, T3FREE, THYROIDAB in the last 72 hours. Anemia Panel: No results for input(s): VITAMINB12, FOLATE, FERRITIN, TIBC, IRON, RETICCTPCT in the last 72 hours. Urine analysis:    Component Value Date/Time   COLORURINE YELLOW 06/29/2015 1603   APPEARANCEUR CLOUDY* 06/29/2015 1603   LABSPEC 1.015 06/29/2015 1603   PHURINE 5.0 06/29/2015 1603   GLUCOSEU NEGATIVE 06/29/2015 1603   GLUCOSEU NEGATIVE 07/14/2011 1312   HGBUR NEGATIVE 06/29/2015 1603   BILIRUBINUR NEGATIVE 06/29/2015 1603   BILIRUBINUR negative 05/21/2015 1442   KETONESUR NEGATIVE 06/29/2015 1603   PROTEINUR NEGATIVE 06/29/2015 1603   PROTEINUR trace 05/21/2015 1442   UROBILINOGEN negative  05/21/2015 1442   UROBILINOGEN 0.2 12/16/2014 1334   NITRITE NEGATIVE 06/29/2015 1603   NITRITE trace 05/21/2015 1442   LEUKOCYTESUR MODERATE* 06/29/2015 1603   Sepsis Labs: @LABRCNTIP (procalcitonin:4,lacticidven:4)  ) Recent Results (from the past 240 hour(s))  MRSA PCR Screening     Status: Abnormal   Collection Time: 06/29/15  8:26 PM  Result Value Ref Range Status   MRSA by PCR POSITIVE (A) NEGATIVE Final    Comment:        The GeneXpert MRSA Assay (FDA approved for NASAL specimens only), is one component of a comprehensive MRSA colonization surveillance program.  It is not intended to diagnose MRSA infection nor to guide or monitor treatment for MRSA infections. RESULT CALLED TO, READ BACK BY AND VERIFIED WITH: GENELL RN AT 2320 ON 06/29/15 BY S.VANHOORNE          Radiology Studies: Dg Chest 2 View  06/29/2015  CLINICAL DATA:  Pneumonia. EXAM: CHEST  2 VIEW COMPARISON:  May 25, 2015. FINDINGS: The heart size and mediastinal contours are within normal limits. Both lungs are clear. No pneumothorax or pleural effusion is noted. The visualized skeletal structures are unremarkable. IMPRESSION: No active cardiopulmonary disease. Electronically Signed   By: Marijo Conception, M.D.   On: 06/29/2015 17:02        Scheduled Meds: . aspirin  325 mg Oral Daily  . calcium carbonate  1 tablet Oral Daily  . cefTRIAXone (ROCEPHIN) IVPB 1 gram/50 mL D5W  1 g Intravenous Q24H  . Chlorhexidine Gluconate Cloth  6 each Topical Q0600  . DULoxetine  30 mg Oral Daily  . DULoxetine  60 mg Oral QHS  . enoxaparin (LOVENOX) injection  40 mg Subcutaneous Q24H  . hydroxychloroquine  200 mg Oral 2 times per day on Mon Tue Wed Thu Fri  . lubiprostone  24 mcg Oral BID WC  . memantine  10 mg Oral BID  . mirabegron ER  25 mg Oral Daily  . mupirocin ointment  1 application Nasal BID  . pantoprazole  40 mg Oral Daily  . pilocarpine  5 mg Oral BID  . pregabalin  75 mg Oral BID  . senna  2  tablet Oral Daily   Continuous Infusions: . sodium chloride 75 mL/hr at 06/30/15 0950     LOS: 1 day    Time spent in minutes: 7     [place], MD [facility] Pager: [email or web address] Password [email or web address] 06/30/2015, 11:35 AM

## 2015-06-30 NOTE — Progress Notes (Signed)
Patient ID: Ariana [name], female   DOB: 11/02/1929, 82 y.o.   MRN: [id number]    Subjective:  Lethargic / confused  Objective:  Filed Vitals:   06/30/15 0300 06/30/15 0400 06/30/15 0500 06/30/15 0600  BP: 136/59 145/45  139/56  Pulse: 50 50 50 53  Temp:  98.3 F (36.8 C)    TempSrc:  Axillary    Resp: 15 13 14 13   Height:      Weight:      SpO2: 96% 93% 96% 94%    Intake/Output from previous day:  Intake/Output Summary (Last 24 hours) at 06/30/15 0736 Last data filed at 06/30/15 0600  Gross per 24 hour  Intake 706.25 ml  Output   1175 ml  Net -468.75 ml    Physical Exam: Affect appropriate Chronically ill white female  HEENT: normal Neck supple with no adenopathy JVP normal no bruits no thyromegaly Lungs clear with no wheezing and good diaphragmatic motion Heart:  S1/S2 no murmur, no rub, gallop or click PMI normal Abdomen: benighn, BS positve, no tenderness, no AAA no bruit.  No HSM or HJR Distal pulses intact with no bruits No edema Neuro non-focal Skin warm and dry No muscular weakness   Lab Results: Basic Metabolic Panel:  Recent Labs  06/29/15 1556 06/29/15 1600 06/30/15 0325  NA 140  --  142  K 4.2  --  4.0  CL 106  --  109  CO2 26  --  28  GLUCOSE 133*  --  100*  BUN 29*  --  21*  CREATININE 0.75  --  0.65  CALCIUM 9.9  --  9.0  MG  --  2.0  --   PHOS  --  3.5  --    Liver Function Tests:  Recent Labs  06/29/15 1556 06/30/15 0325  AST 23 23  ALT 23 19  ALKPHOS 94 76  BILITOT 0.5 0.4  PROT 7.3 6.2*  ALBUMIN 4.4 3.6   CBC:  Recent Labs  06/29/15 1556 06/30/15 0325  WBC 6.4 5.2  NEUTROABS 3.1 2.4  HGB 13.4 11.7*  HCT 40.3 35.4*  MCV 89.4 91.2  PLT 203 170    Imaging: Dg Chest 2 View  06/29/2015  CLINICAL DATA:  Pneumonia. EXAM: CHEST  2 VIEW COMPARISON:  May 25, 2015. FINDINGS: The heart size and mediastinal contours are within normal limits. Both lungs are clear. No pneumothorax or pleural effusion is noted. The  visualized skeletal structures are unremarkable. IMPRESSION: No active cardiopulmonary disease. Electronically Signed   By: Marijo Conception, M.D.   On: 06/29/2015 17:02    Cardiac Studies:  ECG: SB rate 40 PAC   Telemetry:  SR/SB no AV block   Echo: 07/30/15  EF 55-60% mild MR   Medications:   . aspirin  325 mg Oral Daily  . calcium carbonate  1 tablet Oral Daily  . cefTRIAXone (ROCEPHIN) IVPB 1 gram/50 mL D5W  1 g Intravenous Q24H  . Chlorhexidine Gluconate Cloth  6 each Topical Q0600  . DULoxetine  30 mg Oral Daily  . DULoxetine  60 mg Oral QHS  . enoxaparin (LOVENOX) injection  40 mg Subcutaneous Q24H  . lubiprostone  24 mcg Oral BID WC  . memantine  10 mg Oral BID  . mupirocin ointment  1 application Nasal BID  . pantoprazole  40 mg Oral Daily  . pregabalin  75 mg Oral BID     . sodium chloride 75 mL/hr at 06/29/15 2035  Assessment/Plan:  Bradycardia:  Stable beta blocker stopped no AV block continue on telemetry no indication for pacer PAF:  maint NSR no anticoagulation given age and dementia MR:  Mild by echo  Jenkins Rouge 06/30/2015, 7:36 AM

## 2015-06-30 NOTE — Care Management Note (Signed)
Case Management Note  Patient Details  Name: Ariana [name] MRN: [id number] Date of Birth: 29-Nov-1929  Subjective/Objective: 82 y/o f admitted w/UTI. From home. Active w/[facility] HHPT/OT-rep Tim aware & following . Await [place] orders. PT cons-await recc.                   Action/Plan:d/c plan home w/HHC.   Expected Discharge Date:   (unknown)               Expected Discharge Plan:  [place]  In-House Referral:  NA  Discharge planning Services  CM Consult  Post Acute Care Choice:  NA, Home Health (Active w/[facility] HHPT/OT.) Choice offered to:  NA  DME Arranged:    DME Agency:     HH Arranged:    HH Agency:     Status of Service:  In process, will continue to follow  Medicare Important Message Given:    Date Medicare IM Given:    Medicare IM give by:    Date Additional Medicare IM Given:    Additional Medicare Important Message give by:     If discussed at [place] of Stay Meetings, dates discussed:    Additional Comments:  Dessa Phi, RN 06/30/2015, 12:09 PM

## 2015-06-30 NOTE — Care Management Note (Signed)
Case Management Note  Patient Details  Name: Ariana [name] MRN: [id number] Date of Birth: 1929-09-17  Subjective/Objective:      Poss sepsis with hypotensive state and bradycardia              Action/Plan:Date:  Jun 30, 2015 Chart reviewed for concurrent status and case management needs. Will continue to follow patient for changes and needs: Expected discharge date: [id number] Velva Harman, BSN, [place], [place]   Expected Discharge Date:   (unknown)               Expected Discharge Plan:  Home/Self Care  In-House Referral:  NA  Discharge planning Services  CM Consult  Post Acute Care Choice:  NA Choice offered to:  NA  DME Arranged:    DME Agency:     HH Arranged:    [place] Agency:     Status of Service:  In process, will continue to follow  Medicare Important Message Given:    Date Medicare IM Given:    Medicare IM give by:    Date Additional Medicare IM Given:    Additional Medicare Important Message give by:     If discussed at [place] of Stay Meetings, dates discussed:    Additional Comments:  Leeroy Cha, RN 06/30/2015, 10:01 AM

## 2015-06-30 NOTE — Progress Notes (Signed)
Pt. Transferred from stepdown to 1419. Pt. Assessed and put on tele. Will continue to monitor

## 2015-07-01 ENCOUNTER — Encounter (HOSPITAL_COMMUNITY): Payer: Self-pay | Admitting: Physician Assistant

## 2015-07-01 DIAGNOSIS — F0390 Unspecified dementia without behavioral disturbance: Secondary | ICD-10-CM

## 2015-07-01 DIAGNOSIS — I10 Essential (primary) hypertension: Secondary | ICD-10-CM

## 2015-07-01 LAB — CBC WITH DIFFERENTIAL/PLATELET
BASOS PCT: 0 %
Basophils Absolute: 0 10*3/uL (ref 0.0–0.1)
EOS ABS: 0.2 10*3/uL (ref 0.0–0.7)
EOS PCT: 2 %
HCT: 37.5 % (ref 36.0–46.0)
HEMOGLOBIN: 12.5 g/dL (ref 12.0–15.0)
LYMPHS ABS: 2 10*3/uL (ref 0.7–4.0)
Lymphocytes Relative: 31 %
MCH: 30.2 pg (ref 26.0–34.0)
MCHC: 33.3 g/dL (ref 30.0–36.0)
MCV: 90.6 fL (ref 78.0–100.0)
MONO ABS: 0.7 10*3/uL (ref 0.1–1.0)
MONOS PCT: 11 %
NEUTROS PCT: 56 %
Neutro Abs: 3.6 10*3/uL (ref 1.7–7.7)
PLATELETS: 181 10*3/uL (ref 150–400)
RBC: 4.14 MIL/uL (ref 3.87–5.11)
RDW: 13.6 % (ref 11.5–15.5)
WBC: 6.5 10*3/uL (ref 4.0–10.5)

## 2015-07-01 LAB — COMPREHENSIVE METABOLIC PANEL
ALBUMIN: 4 g/dL (ref 3.5–5.0)
ALK PHOS: 83 U/L (ref 38–126)
ALT: 19 U/L (ref 14–54)
ANION GAP: 5 (ref 5–15)
AST: 24 U/L (ref 15–41)
BUN: 15 mg/dL (ref 6–20)
CALCIUM: 9.6 mg/dL (ref 8.9–10.3)
CO2: 31 mmol/L (ref 22–32)
Chloride: 107 mmol/L (ref 101–111)
Creatinine, Ser: 0.71 mg/dL (ref 0.44–1.00)
GFR calc non Af Amer: >60 mL/min (ref >60)
GLUCOSE: 120 mg/dL — AB (ref 65–99)
POTASSIUM: 3.4 mmol/L — AB (ref 3.5–5.1)
SODIUM: 143 mmol/L (ref 135–145)
Total Bilirubin: 0.4 mg/dL (ref 0.3–1.2)
Total Protein: 6.8 g/dL (ref 6.5–8.1)

## 2015-07-01 MED ORDER — LOSARTAN POTASSIUM 50 MG PO TABS
25.0000 mg | ORAL_TABLET | Freq: Every day | ORAL | Status: DC
  Administered 2015-07-01: 25 mg via ORAL
  Filled 2015-07-01: qty 1

## 2015-07-01 MED ORDER — POTASSIUM CHLORIDE CRYS ER 20 MEQ PO TBCR
40.0000 meq | EXTENDED_RELEASE_TABLET | Freq: Once | ORAL | Status: AC
  Administered 2015-07-01: 40 meq via ORAL
  Filled 2015-07-01: qty 2

## 2015-07-01 MED ORDER — DOCUSATE SODIUM 100 MG PO CAPS: 100.0000 mg | ORAL_CAPSULE | Freq: Every day | ORAL | Status: DC

## 2015-07-01 MED ORDER — LOSARTAN POTASSIUM 25 MG PO TABS: 25.0000 mg | ORAL_TABLET | Freq: Every day | ORAL | Status: DC

## 2015-07-01 MED ORDER — SENNA 8.6 MG PO TABS: 2.0000 | ORAL_TABLET | Freq: Every day | ORAL | Status: DC

## 2015-07-01 MED ORDER — ACETAMINOPHEN 325 MG PO TABS
650.0000 mg | ORAL_TABLET | ORAL | Status: DC | PRN
  Administered 2015-07-01: 650 mg via ORAL
  Filled 2015-07-01: qty 2

## 2015-07-01 MED ORDER — CEFPODOXIME PROXETIL 100 MG PO TABS: 100.0000 mg | ORAL_TABLET | Freq: Two times a day (BID) | ORAL | Status: DC

## 2015-07-01 NOTE — Progress Notes (Signed)
Patient Name: Ariana [name] Date of Encounter: 07/01/2015     Principal Problem:   UTI (lower urinary tract infection) Active Problems:   ANXIETY DEPRESSION   HYPERTENSION, BENIGN   GERD   Fibromyalgia   Senile dementia   Constipation   Paroxysmal atrial fibrillation (HCC)   Bradycardia   Chronic lower back pain   Arterial hypotension    SUBJECTIVE  No complaints. A little confused  CURRENT MEDS . aspirin  325 mg Oral Daily  . calcium carbonate  1 tablet Oral Daily  . cefTRIAXone (ROCEPHIN) IVPB 1 gram/50 mL D5W  1 g Intravenous Q24H  . Chlorhexidine Gluconate Cloth  6 each Topical Q0600  . DULoxetine  30 mg Oral Daily  . DULoxetine  60 mg Oral QHS  . enoxaparin (LOVENOX) injection  40 mg Subcutaneous Q24H  . hydroxychloroquine  200 mg Oral 2 times per day on Mon Tue Wed Thu Fri  . lubiprostone  24 mcg Oral BID WC  . memantine  10 mg Oral BID  . mirabegron ER  25 mg Oral Daily  . mupirocin ointment  1 application Nasal BID  . pantoprazole  40 mg Oral Daily  . pilocarpine  5 mg Oral BID  . pregabalin  75 mg Oral BID  . senna  2 tablet Oral Daily    OBJECTIVE  Filed Vitals:   06/30/15 1419 06/30/15 2228 07/01/15 0456 07/01/15 0500  BP: 155/83 149/64 172/70 164/72  Pulse: 70 67 71   Temp: 98.6 F (37 C) 98 F (36.7 C) 97.7 F (36.5 C)   TempSrc: Oral Oral Oral   Resp: 18 20 18    Height:      Weight:      SpO2: 98% 97% 100%     Intake/Output Summary (Last 24 hours) at 07/01/15 0810 Last data filed at 07/01/15 0055  Gross per 24 hour  Intake    650 ml  Output    550 ml  Net    100 ml   Filed Weights   06/29/15 2100 06/30/15 1110  Weight: 151 lb 0.2 oz (68.5 kg) 147 lb 11.3 oz (67 kg)    PHYSICAL EXAM  General: Pleasant, NAD. Neuro: Alert and oriented X 3. Moves all extremities spontaneously. Psych: Normal affect. HEENT:  Normal  Neck: Supple without bruits or JVD. Lungs:  Resp regular and unlabored, CTA. Heart: RRR no s3, s4, or  murmurs. Abdomen: Soft, non-tender, non-distended, BS + x 4.  Extremities: No clubbing, cyanosis or edema. DP/PT/Radials 2+ and equal bilaterally.  Accessory Clinical Findings  CBC  Recent Labs  06/30/15 0325 07/01/15 0529  WBC 5.2 6.5  NEUTROABS 2.4 3.6  HGB 11.7* 12.5  HCT 35.4* 37.5  MCV 91.2 90.6  PLT 170 0000000   Basic Metabolic Panel  Recent Labs  06/29/15 1600 06/30/15 0325 07/01/15 0529  NA  --  142 143  K  --  4.0 3.4*  CL  --  109 107  CO2  --  28 31  GLUCOSE  --  100* 120*  BUN  --  21* 15  CREATININE  --  0.65 0.71  CALCIUM  --  9.0 9.6  MG 2.0  --   --   PHOS 3.5  --   --    Liver Function Tests  Recent Labs  06/30/15 0325 07/01/15 0529  AST 23 24  ALT 19 19  ALKPHOS 76 83  BILITOT 0.4 0.4  PROT 6.2* 6.8  ALBUMIN 3.6 4.0  Thyroid Function Tests  Recent Labs  06/30/15 1214  TSH 0.889    TELE  NSR with some PVCs  Radiology/Studies  Dg Chest 2 View  06/29/2015  CLINICAL DATA:  Pneumonia. EXAM: CHEST  2 VIEW COMPARISON:  May 25, 2015. FINDINGS: The heart size and mediastinal contours are within normal limits. Both lungs are clear. No pneumothorax or pleural effusion is noted. The visualized skeletal structures are unremarkable. IMPRESSION: No active cardiopulmonary disease. Electronically Signed   By: Marijo Conception, M.D.   On: 06/29/2015 17:02    ASSESSMENT AND PLAN  82 yo female with dementia, paroxysmal atrial fibrillation, HTN and prior CVA who presented to [facility] ED on 06/29/15 with complaints of weakness. She was found to have UTI and bradycardia and cardiology was consulted.   1. Bradycardia: Pt has known PAF on beta blocker at home. She presented with sinus brady with some dropped beats. BB was held and now bradycardia resolved. HR in 70s. No indication for PPM   2. Paroxysmal atrial fibrillation: She is in sinus today. Beta blocker held. CHADSVASC score at least 3 (HTN, age, f sex). She has not been started on  anti-coagulation because of dementia. She is followed by Dr. Debara Pickett.   3. HTN: BP now elevated with removal of Metoprolol 50mg  BID. Will add losartan 25mg  daily. She has normal renal function and borderline diabetes (Hg A1C 6.4).  4. UTI: per IM  Signed, Angelena Form PA-C  Pager [phone]  Patient with dementia Telemetry with SR Bradycardia gone off beta blocker.  Agree with ACE for BP and no anticoagulation.  Will sign off  Jenkins Rouge

## 2015-07-01 NOTE — Evaluation (Signed)
Physical Therapy Evaluation Patient Details Name: Ariana [name] MRN: [id number] DOB: Aug 19, 1929 Today's Date: 07/01/2015   History of Present Illness  82 yo female admitted with UTI, hypotension, bradycardia. Hx of Sjogrens, dementia, peripheral neuropathy, HTN, PAfib, chronic back pain, gait abnormality, fibromyalgia.   Clinical Impression  On eval, pt required Min guard-Min assist for mobility-walked ~135 feet with RW. Pt tolerated activity fairly well. Pt reports she has an aide M-F, 8-5. Pt could benefit from increased supervision initially. Pt stated she would like to resume HHPT.     Follow Up Recommendations Home health PT;Supervision/Assistance - 24 hour    Equipment Recommendations  None recommended by PT    Recommendations for Other Services       Precautions / Restrictions Precautions Precautions: Fall Restrictions Weight Bearing Restrictions: No      Mobility  Bed Mobility Overal bed mobility: Needs Assistance Bed Mobility: Supine to Sit     Supine to sit: Min guard;HOB elevated     General bed mobility comments: Increased time. close guard for safety  Transfers Overall transfer level: Needs assistance Equipment used: Rolling walker (2 wheeled) Transfers: Sit to/from Stand Sit to Stand: Min assist         General transfer comment: Multiple attempts to get to standing. close guard for safety. Assist to stabilize.  Ambulation/Gait Ambulation/Gait assistance: Min guard Ambulation Distance (Feet): 135 Feet Assistive device: Rolling walker (2 wheeled) Gait Pattern/deviations: Step-through pattern;Decreased stride length     General Gait Details: close guard for safety. slow gait speed. No c/o dizziness.   Stairs            Wheelchair Mobility    Modified Rankin (Stroke Patients Only)       Balance Overall balance assessment: Needs assistance;History of Falls         Standing balance support: Bilateral upper extremity  supported;During functional activity Standing balance-Leahy Scale: Poor Standing balance comment: requires RW use                             Pertinent Vitals/Pain Pain Assessment: Faces Faces Pain Scale: Hurts little more Pain Location: feet, back Pain Intervention(s): Limited activity within patient's tolerance;Monitored during session;Repositioned    Home Living Family/patient expects to be discharged to:: Private residence Living Arrangements: Alone Available Help at Discharge: Personal care attendant Type of Home: House Home Access: Level entry     Home Layout: One level Home Equipment: Environmental consultant - 4 wheels;Cane - single point;Grab bars - tub/shower;Bedside commode;Shower seat;Cane - quad;Walker - 2 wheels;Wheelchair - manual      Prior Function        ADL's / Homemaking Assistance Needed: Pt reports having aide 9a-5p M-F to A with meals, housework and tub transfers. Pt likely min A UB/LB bathing        Hand Dominance        Extremity/Trunk Assessment   Upper Extremity Assessment: Generalized weakness           Lower Extremity Assessment: Generalized weakness      Cervical / Trunk Assessment: Kyphotic  Communication   Communication: No difficulties  Cognition Arousal/Alertness: Awake/alert Behavior During Therapy: WFL for tasks assessed/performed Overall Cognitive Status: History of cognitive impairments - at baseline                      General Comments      Exercises        Assessment/Plan  PT Assessment Patient needs continued PT services  PT Diagnosis Difficulty walking;Generalized weakness   PT Problem List Decreased strength;Decreased activity tolerance;Decreased balance;Decreased mobility  PT Treatment Interventions DME instruction;Gait training;Functional mobility training;Patient/family education;Therapeutic activities;Balance training;Therapeutic exercise   PT Goals (Current goals can be found in the Care Plan  section) Acute Rehab PT Goals Patient Stated Goal: home  PT Goal Formulation: With patient Time For Goal Achievement: 07/15/15 Potential to Achieve Goals: Good    Frequency Min 3X/week   Barriers to discharge        Co-evaluation               End of Session Equipment Utilized During Treatment: Gait belt Activity Tolerance: Patient tolerated treatment well Patient left: in chair;with call bell/phone within reach;with chair alarm set           Time: [id number] PT Time Calculation (min) (ACUTE ONLY): 27 min   Charges:   PT Evaluation $PT Eval Low Complexity: 1 Procedure PT Treatments $Gait Training: 8-22 mins   PT G Codes:        Weston Anna, MPT Pager: [phone]

## 2015-07-01 NOTE — Progress Notes (Signed)
EDCM strongly encouraged unit RN Apolonio Schneiders to call patient's daughter as patient is discharged and needs to be transported home.

## 2015-07-01 NOTE — Progress Notes (Signed)
EDCM called unit RN for follow up.  Per RN Apolonio Schneiders, patient's family at bedside and being discharged home.

## 2015-07-01 NOTE — Care Management Note (Signed)
Case Management Note  Patient Details  Name: LATALIA DENTE MRN: [id number] Date of Birth: March 11, 1929  Subjective/Objective:   Notified from nsg  that patient needs PTAR transp home.Spoke to dtr-[employer]a [employer] on phone about transp-dtr says she is not paying for ambulance service for transp.She had to pay the last time. States she is just now being told her mother is d/c, she cannot drive, & her spouse Kasandra Knudsen has to be @ work & doesn't get off until 9p-that's when they will pick her up. Dtr will try to get the caregiver to come & get her & bring her to the hospital.  Pleasantly informed dtr that her mother per attending is medically stable for d/c, & there is a safe d/c plan, which means if she stays 1 more night she will pay for the overnight stay.  Dtr voiced understanding & said I will work something out either the caregiver or spouse to pick her up from the hospital.Informed dtr that [facility] will [facility] tomorrow or the next day.Patient already active w/[facility] rep Tim aware of d/c-HHPT/OT. MD/Nsg updated.                 Action/Plan:d/c home w/HHC.   Expected Discharge Date:   (unknown)               Expected Discharge Plan:  [place]  In-House Referral:  NA  Discharge planning Services  CM Consult  Post Acute Care Choice:  NA, Home Health (Active w/[facility] HHPT/OT.) Choice offered to:     DME Arranged:    DME Agency:     HH Arranged:  PT, OT HH Agency:  [place]  Status of Service:  Completed, signed off  Medicare Important Message Given:    Date Medicare IM Given:    Medicare IM give by:    Date Additional Medicare IM Given:    Additional Medicare Important Message give by:     If discussed at [place] of Stay Meetings, dates discussed:    Additional Comments:  Dessa Phi, RN 07/01/2015, 2:45 PM

## 2015-07-01 NOTE — Progress Notes (Signed)
I took over care for this patient at 1500.  I agree with the previous nurses assessment.

## 2015-07-01 NOTE — Discharge Summary (Signed)
Physician Discharge Summary  Ariana [name] [id number] DOB: 04-Jul-1929 DOA: 06/29/2015  PCP: Hollace Kinnier, DO  Admit date: 06/29/2015 Discharge date: 07/01/2015  Time spent: 55 minutes  Recommendations for Outpatient Follow-up:  1. New start on ARB- Bmet in 1 wk 2. F/u for recurrent A-fib off on Metoprolol   Discharge Condition: stable    Discharge Diagnoses:  Principal Problem:   Bradycardia/   Arterial hypotension Active Problems:   UTI (lower urinary tract infection)   ANXIETY DEPRESSION   HYPERTENSION, BENIGN   GERD   Fibromyalgia   Senile dementia   Constipation   Paroxysmal atrial fibrillation (HCC)   Chronic lower back pain    History of present illness:  Ariana [name] is a 82 y.o. female with medical history significant of Sjogren's syndrome, dry eye syndrome, hypertension, MVP, GERD, diverticulosis, IBS, chronic constipation, chronic back pain, fibromyalgia, dementia, peripheral neuropathy, depression and anxiety, paroxysmal atrial fibrillation. She has been feeling weak for a few days and when HR and BP were checked by her HHPT, she was told to go to the ER for low BP and HR. HR was in 30s SBP in 90s. Found to have a UA consistent with UTI. She is a poor historian and tells me she has been having dysuria on and off. Her daughter told there ER and admitting doctors that the patient becomes bradycardic when she has a UTI.   Hospital Course:  Principal Problem:  UTI (lower urinary tract infection)? - poor historian in regards to symptoms- has been started on Rocephin- change to [facility] and continue for 7 days  Active Problems:  Bradycardia/ Hypotension - doubt this is from UTI- stopped Metoprolol- cardiology in agreement with this and do not want to resume it yet  Grade 2 diastolic dysfunction - noted on echo on 6/16- holding B blocker as mentioned - Losartan started in its place  Dehydration - elevated BUN /Cr ratio- given slow IVF   Generalized  weakness - have reviewed her chart and it appears this is a chronic issue -  bradycardia/ hypotension/ dehydration have resolved and she continues to feel weak- PT recommends to continue with HHPT after eval today - checked TSH and F T4 which are normal   PAF- - currently in NSR - follow with BB on hold - not on anticoagulation other than Aspirin 325 mg daily   ANXIETY DEPRESSION - Xanax, Cymbalta   GERD - Protonix   Constipation - already takes Miralax and Amitiza  - added daily senna - states she had a normal BM today- cont daily Senna and add daily Colace as well  Sjogrens/ Dry eyes - Salagen and Plaquenil  Procedures:  none  Consultations:  none  Discharge Exam: Filed Weights   06/29/15 2100 06/30/15 1110  Weight: 68.5 kg (151 lb 0.2 oz) 67 kg (147 lb 11.3 oz)   Filed Vitals:   07/01/15 0456 07/01/15 0500  BP: 172/70 164/72  Pulse: 71   Temp: 97.7 F (36.5 C)   Resp: 18     General: AAO x 3, no distress Cardiovascular: RRR, no murmurs  Respiratory: clear to auscultation bilaterally GI: soft, non-tender, non-distended, bowel sound positive  Discharge Instructions You were cared for by a hospitalist during your hospital stay. If you have any questions about your discharge medications or the care you received while you were in the hospital after you are discharged, you can call the unit and asked to speak with the hospitalist on call if the hospitalist that  took care of you is not available. Once you are discharged, your primary care physician will handle any further medical issues. Please note that NO REFILLS for any discharge medications will be authorized once you are discharged, as it is imperative that you return to your primary care physician (or establish a relationship with a primary care physician if you do not have one) for your aftercare needs so that they can reassess your need for medications and monitor your lab values.      Discharge  Instructions    Diet - low sodium heart healthy    Complete by:  As directed      Discharge instructions    Complete by:  As directed   Cardiology has replaced Metoprolol with Losartan Take Senna and Colace daily to prevent constipation. See PCP in 1 wk     Increase activity slowly    Complete by:  As directed             Medication List    STOP taking these medications        metoprolol 50 MG tablet  Commonly known as:  LOPRESSOR      TAKE these medications        ALPRAZolam 0.5 MG tablet  Commonly known as:  XANAX  Take 1 tablet (0.5 mg total) by mouth 2 (two) times daily as needed for anxiety.     aspirin 325 MG tablet  Take 1 tablet (325 mg total) by mouth daily.     bisacodyl 5 MG EC tablet  Commonly known as:  DULCOLAX  Take 10 mg by mouth daily as needed for moderate constipation.     calcium carbonate 500 MG chewable tablet  Commonly known as:  TUMS - dosed in mg elemental calcium  Chew 1 tablet by mouth daily.     cefpodoxime 100 MG tablet  Commonly known as:  VANTIN  Take 1 tablet (100 mg total) by mouth 2 (two) times daily.     docusate sodium 100 MG capsule  Commonly known as:  COLACE  Take 1 capsule (100 mg total) by mouth daily.     DULoxetine 60 MG capsule  Commonly known as:  CYMBALTA  Take one tablet by mouth in the evening once daily for depression     DULoxetine 30 MG capsule  Commonly known as:  CYMBALTA  Take one tablet  once daily in the morning for depression     FIRST-DUKES MOUTHWASH Susp  1 tsp garlge and swallow four times daily     fluticasone-salmeterol 45-21 MCG/ACT inhaler  Commonly known as:  ADVAIR HFA  Inhale 2 puffs into the lungs 2 (two) times daily.     HYDROcodone-acetaminophen 5-325 MG tablet  Commonly known as:  NORCO/VICODIN  Take 1 tablet by mouth every 6 (six) hours as needed for moderate pain.     hydroxychloroquine 200 MG tablet  Commonly known as:  PLAQUENIL  Take 200 mg by mouth 2 (two) times daily. Take  one tablet twice daily Monday thru Friday, only     losartan 25 MG tablet  Commonly known as:  COZAAR  Take 1 tablet (25 mg total) by mouth daily.     lubiprostone 24 MCG capsule  Commonly known as:  AMITIZA  Take 1 capsule (24 mcg total) by mouth 2 (two) times daily with a meal.     LYRICA 75 MG capsule  Generic drug:  pregabalin  TAKE ONE CAPSULE BY MOUTH TWICE A DAY  magic mouthwash w/lidocaine Soln  Take 5 mLs by mouth 4 (four) times daily.     memantine 10 MG tablet  Commonly known as:  NAMENDA  Take 1 tablet (10 mg total) by mouth 2 (two) times daily.     mirabegron ER 25 MG Tb24 tablet  Commonly known as:  MYRBETRIQ  Take 1 tablet (25 mg total) by mouth daily.     omeprazole 20 MG capsule  Commonly known as:  PRILOSEC  Take 20 mg by mouth daily.     pilocarpine 5 MG tablet  Commonly known as:  SALAGEN  Take 5 mg by mouth 2 (two) times daily.     REFRESH TEARS 0.5 % Soln  Generic drug:  carboxymethylcellulose  Place 1 drop into both eyes 2 (two) times daily as needed (dry eyes).     senna 8.6 MG Tabs tablet  Commonly known as:  SENOKOT  Take 2 tablets (17.2 mg total) by mouth daily.     sodium chloride 0.65 % Soln nasal spray  Commonly known as:  OCEAN  Place 1 spray into the nose as needed for congestion.       Allergies  Allergen Reactions  . Banana Nausea And Vomiting  . Codeine Nausea Only    unless given with Phenergan  . Klonopin [Clonazepam] Other (See Comments)    Causes hallucination   . Meperidine Hcl Nausea Only    unless given with Phenergan  . Norflex [Orphenadrine Citrate] Nausea Only    Unless given with Phenergan  . Oxycodone-Acetaminophen Nausea Only    unless given with phenergan  . Propoxyphene Hcl Nausea Only    unless given with phenergan  . Zoloft [Sertraline Hcl] Other (See Comments)    Caused lethargy  . Doxycycline Other (See Comments)    Unknown  . Naproxen Other (See Comments)  . Penicillins Other (See Comments)     Unknown  . Phenothiazines Other (See Comments)    Unknown  . Stelazine Other (See Comments)    Unknown  . Sulfamethoxazole-Trimethoprim Other (See Comments)    Unknown  . Tolectin [Tolmetin Sodium] Other (See Comments)    Unknown  . Tramadol Other (See Comments)    Unknown      The results of significant diagnostics from this hospitalization (including imaging, microbiology, ancillary and laboratory) are listed below for reference.    Significant Diagnostic Studies: Dg Chest 2 View  06/29/2015  CLINICAL DATA:  Pneumonia. EXAM: CHEST  2 VIEW COMPARISON:  May 25, 2015. FINDINGS: The heart size and mediastinal contours are within normal limits. Both lungs are clear. No pneumothorax or pleural effusion is noted. The visualized skeletal structures are unremarkable. IMPRESSION: No active cardiopulmonary disease. Electronically Signed   By: Marijo Conception, M.D.   On: 06/29/2015 17:02    Microbiology: Recent Results (from the past 240 hour(s))  MRSA PCR Screening     Status: Abnormal   Collection Time: 06/29/15  8:26 PM  Result Value Ref Range Status   MRSA by PCR POSITIVE (A) NEGATIVE Final    Comment:        The GeneXpert MRSA Assay (FDA approved for NASAL specimens only), is one component of a comprehensive MRSA colonization surveillance program. It is not intended to diagnose MRSA infection nor to guide or monitor treatment for MRSA infections. RESULT CALLED TO, READ BACK BY AND VERIFIED WITH: GENELL RN AT 2320 ON 06/29/15 BY S.VANHOORNE      Labs: Basic Metabolic Panel:  Recent Labs Lab  06/29/15 1556 06/29/15 1600 06/30/15 0325 07/01/15 0529  NA 140  --  142 143  K 4.2  --  4.0 3.4*  CL 106  --  109 107  CO2 26  --  28 31  GLUCOSE 133*  --  100* 120*  BUN 29*  --  21* 15  CREATININE 0.75  --  0.65 0.71  CALCIUM 9.9  --  9.0 9.6  MG  --  2.0  --   --   PHOS  --  3.5  --   --    Liver Function Tests:  Recent Labs Lab 06/29/15 1556 06/30/15 0325  07/01/15 0529  AST 23 23 24   ALT 23 19 19   ALKPHOS 94 76 83  BILITOT 0.5 0.4 0.4  PROT 7.3 6.2* 6.8  ALBUMIN 4.4 3.6 4.0   No results for input(s): LIPASE, AMYLASE in the last 168 hours. No results for input(s): AMMONIA in the last 168 hours. CBC:  Recent Labs Lab 06/29/15 1556 06/30/15 0325 07/01/15 0529  WBC 6.4 5.2 6.5  NEUTROABS 3.1 2.4 3.6  HGB 13.4 11.7* 12.5  HCT 40.3 35.4* 37.5  MCV 89.4 91.2 90.6  PLT 203 170 181   Cardiac Enzymes: No results for input(s): CKTOTAL, CKMB, CKMBINDEX, TROPONINI in the last 168 hours. BNP: BNP (last 3 results) No results for input(s): BNP in the last 8760 hours.  ProBNP (last 3 results) No results for input(s): PROBNP in the last 8760 hours.  CBG: No results for input(s): GLUCAP in the last 168 hours.     SignedDebbe Odea, MD [facility] 07/01/2015, 1:28 PM

## 2015-07-06 ENCOUNTER — Telehealth: Payer: Self-pay

## 2015-07-06 NOTE — Telephone Encounter (Signed)
Ariana [name] [phone]) from [place] also called for verbal orders for PT 2 x weekly for 5 weeks. Per [name] standing order, verbal order given. Message will be sent to patient's provider as a FYI.

## 2015-07-06 NOTE — Telephone Encounter (Signed)
[place] with [facility] called to get a verbal order for Occupational Therapy 1 x 1 week and 2 x 5 weeks. Per [name] standing order, verbal order given. Message will be sent to patient's provider as a FYI.   Pending appointments: 07/08/15 (ManX Mast, NP) and 09/06/15 (Dr.Reed)

## 2015-07-07 ENCOUNTER — Other Ambulatory Visit: Payer: Self-pay | Admitting: Dermatology

## 2015-07-07 DIAGNOSIS — D485 Neoplasm of uncertain behavior of skin: Secondary | ICD-10-CM | POA: Diagnosis not present

## 2015-07-07 DIAGNOSIS — L820 Inflamed seborrheic keratosis: Secondary | ICD-10-CM | POA: Diagnosis not present

## 2015-07-07 DIAGNOSIS — L570 Actinic keratosis: Secondary | ICD-10-CM | POA: Diagnosis not present

## 2015-07-08 ENCOUNTER — Ambulatory Visit (INDEPENDENT_AMBULATORY_CARE_PROVIDER_SITE_OTHER): Payer: Medicare Other | Admitting: Nurse Practitioner

## 2015-07-08 ENCOUNTER — Encounter: Payer: Self-pay | Admitting: Nurse Practitioner

## 2015-07-08 VITALS — BP 132/60 | HR 80 | Temp 97.7°F | Resp 20 | Ht 63.0 in | Wt 150.4 lb

## 2015-07-08 DIAGNOSIS — H04129 Dry eye syndrome of unspecified lacrimal gland: Secondary | ICD-10-CM | POA: Diagnosis not present

## 2015-07-08 DIAGNOSIS — N318 Other neuromuscular dysfunction of bladder: Secondary | ICD-10-CM | POA: Diagnosis not present

## 2015-07-08 DIAGNOSIS — K5900 Constipation, unspecified: Secondary | ICD-10-CM

## 2015-07-08 DIAGNOSIS — I4891 Unspecified atrial fibrillation: Secondary | ICD-10-CM

## 2015-07-08 DIAGNOSIS — F0390 Unspecified dementia without behavioral disturbance: Secondary | ICD-10-CM

## 2015-07-08 DIAGNOSIS — N390 Urinary tract infection, site not specified: Secondary | ICD-10-CM

## 2015-07-08 DIAGNOSIS — I519 Heart disease, unspecified: Secondary | ICD-10-CM | POA: Diagnosis not present

## 2015-07-08 DIAGNOSIS — I5189 Other ill-defined heart diseases: Secondary | ICD-10-CM | POA: Insufficient documentation

## 2015-07-08 DIAGNOSIS — K219 Gastro-esophageal reflux disease without esophagitis: Secondary | ICD-10-CM | POA: Diagnosis not present

## 2015-07-08 MED ORDER — DULOXETINE HCL 60 MG PO CPEP
ORAL_CAPSULE | ORAL | Status: DC
Start: 2015-07-08 — End: 2015-07-13

## 2015-07-08 NOTE — Assessment & Plan Note (Signed)
Stable, continue Myrbetriq °

## 2015-07-08 NOTE — Assessment & Plan Note (Addendum)
has been started on Rocephin- change to [place] and continue for 7 days, last dose 07/09/15. Repeat UA C/S

## 2015-07-08 NOTE — Assessment & Plan Note (Signed)
Grade 2 diastolic dysfunction - noted on echo on 6/16- holding B blocker as mentioned - Losartan started in its place - BMP

## 2015-07-08 NOTE — Progress Notes (Signed)
Location:   [place] clinic   Place of Service:   [facility] clinic  Provider: Marlana Latus NP  Code Status: DNR Goals of Care:  Advanced Directives 06/29/2015  Does patient have an advance directive? No  Type of Advance Directive -  Copy of advanced directive(s) in chart? -     Chief Complaint  Patient presents with  . Hospitalization Follow-up    Has concerns about the bloating of her stomach,    HPI: Patient is a 82 y.o. female seen today for hospital 06/29/15 to 07/01/15  follow-up s/p UTI (lower urinary tract infection)? poor historian in regards to symptoms- has been started on Rocephin in hospital- changed to [facility] and continue for 7 days. Dehydration- elevated BUN /Cr ratio- given slow IVF   Past Medical History  Diagnosis Date  . Sjogren's syndrome ([place])   . Dry eye syndrome   . Hypertension, benign   . Mitral valve prolapse   . GERD (gastroesophageal reflux disease)   . Diverticulosis of colon   . Irritable bowel syndrome   . Urinary incontinence   . Low back pain syndrome   . Fibromyalgia   . Memory loss   . Anxiety and depression   . History of adverse drug reaction   . Peripheral neuropathy (HCC)     "both feet and legs"  . History of recurrent UTIs   . H/O hiatal hernia   . Anxiety   . Dementia without behavioral disturbance   . Depression   . Abnormality of gait   . Thyroid nodule   . Adenomatous polyp of colon 2002    29mm  . History of cerebrovascular disease 09/24/2014  . Physical deconditioning   . Paroxysmal a-fib ([place])   . Therapeutic opioid-induced constipation (OIC)   . Dysthymic disorder   . Chronic back pain   . Dysphagia   . Allergic rhinitis     Past Surgical History  Procedure Laterality Date  . Vesicovaginal fistula closure w/ tah    . Appendectomy    . Cholecystectomy  2000  . Mandible surgery    . Temporomandibular joint surgery  1986    Dr. Terence Lux  . Cataract extraction, bilateral    . Abdominal hysterectomy  1967  . Dental  surgery      multiple tooth extractions  . Esophagogastroduodenoscopy (egd) with esophageal dilation N/A 08/23/2012    Procedure: ESOPHAGOGASTRODUODENOSCOPY (EGD) WITH ESOPHAGEAL DILATION;  Surgeon: Milus Banister, MD;  Location: [facility];  Service: Endoscopy;  Laterality: N/A;  . Colonoscopy w/ biopsies      multiple  . Nasal septum surgery  1980  . Transthoracic echocardiogram  2001    mild LVH, normal LV  . Nm myocar perf wall motion  2003    persantine - normal static and dynamic study w/apical thinning and presvered LV function, no ischemia  . Cardiac catheterization  02/17/2003    normal L main, LAD free of disease, Cfx free of disease, RCA free of disease (Dr. Loni Muse. [place])    Allergies  Allergen Reactions  . Banana Nausea And Vomiting  . Codeine Nausea Only    unless given with Phenergan  . Klonopin [Clonazepam] Other (See Comments)    Causes hallucination   . Meperidine Hcl Nausea Only    unless given with Phenergan  . Norflex [Orphenadrine Citrate] Nausea Only    Unless given with Phenergan  . Oxycodone-Acetaminophen Nausea Only    unless given with phenergan  . Propoxyphene Hcl Nausea Only  unless given with phenergan  . Zoloft [Sertraline Hcl] Other (See Comments)    Caused lethargy  . Doxycycline Other (See Comments)    Unknown  . Naproxen Other (See Comments)  . Penicillins Other (See Comments)    Unknown  . Phenothiazines Other (See Comments)    Unknown  . Stelazine Other (See Comments)    Unknown  . Sulfamethoxazole-Trimethoprim Other (See Comments)    Unknown  . Tolectin [Tolmetin Sodium] Other (See Comments)    Unknown  . Tramadol Other (See Comments)    Unknown      Medication List       This list is accurate as of: 07/08/15  3:49 PM.  Always use your most recent med list.               ALPRAZolam 0.5 MG tablet  Commonly known as:  XANAX  Take 1 tablet (0.5 mg total) by mouth 2 (two) times daily as needed for anxiety.     aspirin 325  MG tablet  Take 1 tablet (325 mg total) by mouth daily.     bisacodyl 5 MG EC tablet  Commonly known as:  DULCOLAX  Take 10 mg by mouth daily as needed for moderate constipation.     calcium carbonate 500 MG chewable tablet  Commonly known as:  TUMS - dosed in mg elemental calcium  Chew 1 tablet by mouth daily.     cefpodoxime 100 MG tablet  Commonly known as:  VANTIN  Take 1 tablet (100 mg total) by mouth 2 (two) times daily.     docusate sodium 100 MG capsule  Commonly known as:  COLACE  Take 1 capsule (100 mg total) by mouth daily.     DULoxetine 30 MG capsule  Commonly known as:  CYMBALTA  Take one tablet  once daily in the morning for depression     DULoxetine 60 MG capsule  Commonly known as:  CYMBALTA  Take one tablet by mouth in the evening once daily for depression     FIRST-DUKES MOUTHWASH Susp  1 tsp garlge and swallow four times daily     fluticasone-salmeterol 45-21 MCG/ACT inhaler  Commonly known as:  ADVAIR HFA  Inhale 2 puffs into the lungs 2 (two) times daily.     HYDROcodone-acetaminophen 5-325 MG tablet  Commonly known as:  NORCO/VICODIN  Take 1 tablet by mouth every 6 (six) hours as needed for moderate pain.     hydroxychloroquine 200 MG tablet  Commonly known as:  PLAQUENIL  Take 200 mg by mouth 2 (two) times daily. Take one tablet twice daily Monday thru Friday, only     losartan 25 MG tablet  Commonly known as:  COZAAR  Take 1 tablet (25 mg total) by mouth daily.     lubiprostone 24 MCG capsule  Commonly known as:  AMITIZA  Take 1 capsule (24 mcg total) by mouth 2 (two) times daily with a meal.     LYRICA 75 MG capsule  Generic drug:  pregabalin  TAKE ONE CAPSULE BY MOUTH TWICE A DAY     magic mouthwash w/lidocaine Soln  Take 5 mLs by mouth 4 (four) times daily.     memantine 10 MG tablet  Commonly known as:  NAMENDA  Take 1 tablet (10 mg total) by mouth 2 (two) times daily.     mirabegron ER 25 MG Tb24 tablet  Commonly known as:   MYRBETRIQ  Take 1 tablet (25 mg total) by mouth daily.  omeprazole 20 MG capsule  Commonly known as:  PRILOSEC  Take 20 mg by mouth daily.     pilocarpine 5 MG tablet  Commonly known as:  SALAGEN  Take 5 mg by mouth 2 (two) times daily.     REFRESH TEARS 0.5 % Soln  Generic drug:  carboxymethylcellulose  Place 1 drop into both eyes 2 (two) times daily as needed (dry eyes).     senna 8.6 MG Tabs tablet  Commonly known as:  SENOKOT  Take 2 tablets (17.2 mg total) by mouth daily.     sodium chloride 0.65 % Soln nasal spray  Commonly known as:  OCEAN  Place 1 spray into the nose as needed for congestion.        Review of Systems:  Review of Systems  Constitutional: Negative for fever, chills, appetite change and unexpected weight change.  HENT: Negative for congestion and sinus pressure.   Respiratory: Negative for chest tightness and shortness of breath.   Cardiovascular: Negative for chest pain, palpitations and leg swelling.  Gastrointestinal: Positive for abdominal pain, constipation and abdominal distention. Negative for nausea and diarrhea.       Still has hard pebble stools  Genitourinary: Positive for urgency. Negative for dysuria and difficulty urinating.  Musculoskeletal: Positive for myalgias, back pain, joint swelling, arthralgias and gait problem.       Left knee tender and swollen  Skin: Positive for pallor.  Neurological: Positive for weakness. Negative for dizziness.       Very unsteady gait, uses walker  Psychiatric/Behavioral: Positive for confusion, decreased concentration and agitation. The patient is nervous/anxious.     Health Maintenance  Topic Date Due  . DEXA SCAN  07/04/1996  . INFLUENZA VACCINE  09/14/2015  . TETANUS/TDAP  12/09/2023  . ZOSTAVAX  Completed  . PNA vac Low Risk Adult  Completed    Physical Exam: Filed Vitals:   07/08/15 1201  BP: 132/60  Pulse: 80  Temp: 97.7 F (36.5 C)  TempSrc: Oral  Resp: 20  Height: 5\' 3"  (1.6  m)  Weight: 150 lb 6.4 oz (68.221 kg)  SpO2: 95%   Body mass index is 26.65 kg/(m^2). Physical Exam  Constitutional: No distress.  HENT:  Head: Normocephalic and atraumatic.  Cardiovascular: Normal rate, regular rhythm, normal heart sounds and intact distal pulses.   Pulmonary/Chest: Effort normal and breath sounds normal. No respiratory distress.  Abdominal: Soft. Bowel sounds are normal. She exhibits distension. She exhibits no mass. There is no tenderness. There is no rebound and no guarding.  Musculoskeletal:  Stooped posture, walks with walker; gets up very slowly; some mild swelling of left knee and tenderness of both knee posteriorly; edema of left leg otherwise has resolved  Neurological: She is alert.  Oriented to person and place only; answers questions incorrectly and argues with caregiver (or daughter when she comes)   Skin: Skin is warm and dry. There is pallor.  Stage 2 left buttock  Psychiatric:  Very negative about everything, flat affect    Labs reviewed: Basic Metabolic Panel:  Recent Labs  04/19/15 04/19/15 0349  06/29/15 1556 06/29/15 1600 06/30/15 0325 06/30/15 1214 07/01/15 0529  NA  --   --   < > 140  --  142  --  143  K  --   --   < > 4.2  --  4.0  --  3.4*  CL  --   --   < > 106  --  109  --  107  CO2  --   --   < > 26  --  28  --  31  GLUCOSE  --   --   < > 133*  --  100*  --  120*  BUN  --   --   < > 29*  --  21*  --  15  CREATININE  --   --   < > 0.75  --  0.65  --  0.71  CALCIUM  --   --   < > 9.9  --  9.0  --  9.6  MG  --   --   --   --  2.0  --   --   --   PHOS  --   --   --   --  3.5  --   --   --   TSH 1.68 1.680  --   --   --   --  0.889  --   < > = values in this interval not displayed. Liver Function Tests:  Recent Labs  06/29/15 1556 06/30/15 0325 07/01/15 0529  AST 23 23 24   ALT 23 19 19   ALKPHOS 94 76 83  BILITOT 0.5 0.4 0.4  PROT 7.3 6.2* 6.8  ALBUMIN 4.4 3.6 4.0    Recent Labs  12/07/14 1409  LIPASE 36     Recent Labs  12/04/14 0815  AMMONIA 28   CBC:  Recent Labs  06/29/15 1556 06/30/15 0325 07/01/15 0529  WBC 6.4 5.2 6.5  NEUTROABS 3.1 2.4 3.6  HGB 13.4 11.7* 12.5  HCT 40.3 35.4* 37.5  MCV 89.4 91.2 90.6  PLT 203 170 181   Lipid Panel:  Recent Labs  07/27/14 0332 12/17/14 0215  CHOL 157 121  HDL 48 45  LDLCALC 97 60  TRIG 58 78  CHOLHDL 3.3 2.7   Lab Results  Component Value Date   HGBA1C 6.4* 04/19/2015    Procedures since last visit: Dg Chest 2 View  06/29/2015  CLINICAL DATA:  Pneumonia. EXAM: CHEST  2 VIEW COMPARISON:  May 25, 2015. FINDINGS: The heart size and mediastinal contours are within normal limits. Both lungs are clear. No pneumothorax or pleural effusion is noted. The visualized skeletal structures are unremarkable. IMPRESSION: No active cardiopulmonary disease. Electronically Signed   By: Marijo Conception, M.D.   On: 06/29/2015 17:02    Assessment/Plan UTI (lower urinary tract infection) has been started on Rocephin- change to Vantin and continue for 7 days, last dose 07/09/15. Repeat UA C/S   Atrial fibrillation with RVR (HCC) stopped Metoprolol due to bradycardia- cardiology in agreement with this and do not want to resume it yet,  Aspirin 325 mg daily - diastolic dysfunction - noted on echo on 6/16- holding B blocker as mentioned - Losartan started in its place      Diastolic dysfunction Grade 2 diastolic dysfunction - noted on echo on 6/16- holding B blocker as mentioned - Losartan started in its place - BMP  Dementia without behavioral disturbance ANXIETY DEPRESSION DEMENTIA - Xanax, Cymbalta, Namenda    GERD Stable, continue Protonix.   Constipation Chronic, bloated, last CT abd 11/2014, underwent GI workup in the past, continue Senna, Amitiza, Colace, Dulcolax  Hypertonicity, bladder Stable, continue Myrbetriq  Tear film insufficiency - Salagen and Plaquenil     Labs/tests ordered:  @ORDERS @ BMP and UA  C/S Next appt:  09/06/2015

## 2015-07-08 NOTE — Assessment & Plan Note (Addendum)
-   Salagen and Plaquenil

## 2015-07-08 NOTE — Assessment & Plan Note (Signed)
Stable, continue Protonix 

## 2015-07-08 NOTE — Assessment & Plan Note (Addendum)
Chronic, bloated, last CT abd 11/2014, underwent GI workup in the past, continue Senna, Amitiza, Colace, Dulcolax

## 2015-07-08 NOTE — Patient Instructions (Signed)
BMP, UA C/S today Continue previous appointment

## 2015-07-08 NOTE — Assessment & Plan Note (Addendum)
stopped Metoprolol due to bradycardia- cardiology in agreement with this and do not want to resume it yet,  Aspirin 325 mg daily - diastolic dysfunction - noted on echo on 6/16- holding B blocker as mentioned - Losartan started in its place

## 2015-07-08 NOTE — Assessment & Plan Note (Addendum)
ANXIETY DEPRESSION DEMENTIA - Xanax, Cymbalta, Namenda

## 2015-07-09 DIAGNOSIS — E860 Dehydration: Secondary | ICD-10-CM | POA: Diagnosis not present

## 2015-07-09 DIAGNOSIS — N390 Urinary tract infection, site not specified: Secondary | ICD-10-CM | POA: Diagnosis not present

## 2015-07-09 DIAGNOSIS — G609 Hereditary and idiopathic neuropathy, unspecified: Secondary | ICD-10-CM | POA: Diagnosis not present

## 2015-07-09 DIAGNOSIS — F0390 Unspecified dementia without behavioral disturbance: Secondary | ICD-10-CM | POA: Diagnosis not present

## 2015-07-09 DIAGNOSIS — I10 Essential (primary) hypertension: Secondary | ICD-10-CM | POA: Diagnosis not present

## 2015-07-09 DIAGNOSIS — I480 Paroxysmal atrial fibrillation: Secondary | ICD-10-CM | POA: Diagnosis not present

## 2015-07-09 LAB — URINALYSIS
BILIRUBIN UA: NEGATIVE
GLUCOSE, UA: NEGATIVE
Ketones, UA: NEGATIVE
LEUKOCYTES UA: NEGATIVE
Nitrite, UA: NEGATIVE
PROTEIN UA: NEGATIVE
RBC UA: NEGATIVE
SPEC GRAV UA: 1.016 (ref 1.005–1.030)
Urobilinogen, Ur: 0.2 mg/dL (ref 0.2–1.0)
pH, UA: 5.5 (ref 5.0–7.5)

## 2015-07-09 LAB — BASIC METABOLIC PANEL
BUN / CREAT RATIO: 30 — AB (ref 12–28)
BUN: 22 mg/dL (ref 8–27)
CO2: 28 mmol/L (ref 18–29)
Calcium: 10.2 mg/dL (ref 8.7–10.3)
Chloride: 96 mmol/L (ref 96–106)
Creatinine, Ser: 0.73 mg/dL (ref 0.57–1.00)
GFR calc Af Amer: 87 mL/min/{1.73_m2} (ref >59)
GFR calc non Af Amer: 76 mL/min/{1.73_m2} (ref >59)
GLUCOSE: 94 mg/dL (ref 65–99)
POTASSIUM: 4.6 mmol/L (ref 3.5–5.2)
SODIUM: 139 mmol/L (ref 134–144)

## 2015-07-13 ENCOUNTER — Other Ambulatory Visit: Payer: Self-pay | Admitting: *Deleted

## 2015-07-13 MED ORDER — DULOXETINE HCL 60 MG PO CPEP: ORAL_CAPSULE | ORAL | Status: DC

## 2015-07-13 NOTE — Telephone Encounter (Signed)
Patient daughter requested Rx to be sent to pharmacy. Was in last week and was told it was faxed but pharmacy told them they haven't received.

## 2015-07-16 ENCOUNTER — Other Ambulatory Visit: Payer: Self-pay | Admitting: Adult Health

## 2015-07-24 ENCOUNTER — Other Ambulatory Visit: Payer: Self-pay | Admitting: Internal Medicine

## 2015-07-26 NOTE — Telephone Encounter (Signed)
Rx request sent to pharmacy.  

## 2015-07-27 DIAGNOSIS — N6042 Mammary duct ectasia of left breast: Secondary | ICD-10-CM | POA: Diagnosis not present

## 2015-07-27 LAB — HM MAMMOGRAPHY

## 2015-07-29 ENCOUNTER — Encounter: Payer: Self-pay | Admitting: Internal Medicine

## 2015-07-29 ENCOUNTER — Ambulatory Visit (INDEPENDENT_AMBULATORY_CARE_PROVIDER_SITE_OTHER): Payer: Medicare Other

## 2015-07-29 ENCOUNTER — Ambulatory Visit (INDEPENDENT_AMBULATORY_CARE_PROVIDER_SITE_OTHER): Payer: Medicare Other | Admitting: Family Medicine

## 2015-07-29 VITALS — BP 132/80 | HR 76 | Temp 98.1°F | Resp 16 | Ht 63.0 in | Wt 147.0 lb

## 2015-07-29 DIAGNOSIS — W19XXXA Unspecified fall, initial encounter: Secondary | ICD-10-CM

## 2015-07-29 DIAGNOSIS — N3090 Cystitis, unspecified without hematuria: Secondary | ICD-10-CM

## 2015-07-29 DIAGNOSIS — R8299 Other abnormal findings in urine: Secondary | ICD-10-CM

## 2015-07-29 DIAGNOSIS — R300 Dysuria: Secondary | ICD-10-CM | POA: Diagnosis not present

## 2015-07-29 DIAGNOSIS — M79602 Pain in left arm: Secondary | ICD-10-CM

## 2015-07-29 DIAGNOSIS — M7989 Other specified soft tissue disorders: Secondary | ICD-10-CM | POA: Diagnosis not present

## 2015-07-29 DIAGNOSIS — R82998 Other abnormal findings in urine: Secondary | ICD-10-CM

## 2015-07-29 DIAGNOSIS — M79632 Pain in left forearm: Secondary | ICD-10-CM | POA: Diagnosis not present

## 2015-07-29 LAB — POCT URINALYSIS DIP (MANUAL ENTRY)
BILIRUBIN UA: NEGATIVE
BILIRUBIN UA: NEGATIVE
GLUCOSE UA: NEGATIVE
Nitrite, UA: NEGATIVE
PROTEIN UA: NEGATIVE
Spec Grav, UA: 1.015
Urobilinogen, UA: 0.2
pH, UA: 6

## 2015-07-29 LAB — POC MICROSCOPIC URINALYSIS (UMFC): MUCUS RE: ABSENT

## 2015-07-29 MED ORDER — CEPHALEXIN 500 MG PO CAPS: 500.0000 mg | ORAL_CAPSULE | Freq: Three times a day (TID) | ORAL | Status: DC

## 2015-07-29 NOTE — Progress Notes (Signed)
Subjective:    Patient ID: ACSA ARANGO, female    DOB: Mar 09, 1929, 82 y.o.   MRN: [id number]  HPI This is a pleasant 82 yo female who is accompanied by her caregiver.   She fell about a week ago while in the bathroom. She fell onto her left side and had pain in her left forearm. Left fingers feel tight and puffy. She has some persistent swelling. No redness or ecchymosis. Has not been taking anything for pain, used ice initially.   Has noticed that her urine is very dark for 2-3 days. Has occasional burning, nocturia some nights. Got up 3 times last night. Feels bloated, no abdominal pain. Chronic constipation. Last bowel movement today. On several medications that contribute to constipation.   Past Medical History  Diagnosis Date  . Sjogren's syndrome ([place])   . Dry eye syndrome   . Hypertension, benign   . Mitral valve prolapse   . GERD (gastroesophageal reflux disease)   . Diverticulosis of colon   . Irritable bowel syndrome   . Urinary incontinence   . Low back pain syndrome   . Fibromyalgia   . Memory loss   . Anxiety and depression   . History of adverse drug reaction   . Peripheral neuropathy (HCC)     "both feet and legs"  . History of recurrent UTIs   . H/O hiatal hernia   . Anxiety   . Dementia without behavioral disturbance   . Depression   . Abnormality of gait   . Thyroid nodule   . Adenomatous polyp of colon 2002    24mm  . History of cerebrovascular disease 09/24/2014  . Physical deconditioning   . Paroxysmal a-fib ([place])   . Therapeutic opioid-induced constipation (OIC)   . Dysthymic disorder   . Chronic back pain   . Dysphagia   . Allergic rhinitis    Past Surgical History  Procedure Laterality Date  . Vesicovaginal fistula closure w/ tah    . Appendectomy    . Cholecystectomy  2000  . Mandible surgery    . Temporomandibular joint surgery  1986    Dr. Terence Lux  . Cataract extraction, bilateral    . Abdominal hysterectomy  1967  . Dental  surgery      multiple tooth extractions  . Esophagogastroduodenoscopy (egd) with esophageal dilation N/A 08/23/2012    Procedure: ESOPHAGOGASTRODUODENOSCOPY (EGD) WITH ESOPHAGEAL DILATION;  Surgeon: Milus Banister, MD;  Location: [facility];  Service: Endoscopy;  Laterality: N/A;  . Colonoscopy w/ biopsies      multiple  . Nasal septum surgery  1980  . Transthoracic echocardiogram  2001    mild LVH, normal LV  . Nm myocar perf wall motion  2003    persantine - normal static and dynamic study w/apical thinning and presvered LV function, no ischemia  . Cardiac catheterization  02/17/2003    normal L main, LAD free of disease, Cfx free of disease, RCA free of disease (Dr. Rockne Menghini)   Family History  Problem Relation Age of Onset  . Heart disease Father     heart attack  . Pneumonia Mother   . Heart attack Mother   . Hypertension Mother   . Hypertension Maternal Grandmother   . Colon cancer Sister   . Kidney disease Daughter   . Asthma Daughter   . Arthritis Daughter 42    osteo,  . Heart disease Son 48    stage 3 CHF(Diastolic /Systolic)  . Throat cancer  Brother    Social History  Substance Use Topics  . Smoking status: Former Research scientist (life sciences)  . Smokeless tobacco: Never Used     Comment: Quit at age 72   . Alcohol Use: No     Review of Systems Per HPI    Objective:   Physical Exam  Constitutional: She is oriented to person, place, and time. She appears well-developed and well-nourished. No distress.  HENT:  Head: Normocephalic and atraumatic.  Eyes: Conjunctivae are normal.  Cardiovascular: Normal rate and regular rhythm.   Pulmonary/Chest: Effort normal and breath sounds normal.  Abdominal: Soft. Bowel sounds are normal. She exhibits no distension. There is no tenderness. There is no CVA tenderness.  Musculoskeletal:       Left forearm: She exhibits tenderness. She exhibits no bony tenderness, no swelling, no edema and no deformity.       Arms: Neurological: She is alert  and oriented to person, place, and time.  Skin: Skin is warm and dry. She is not diaphoretic.  Psychiatric: She has a normal mood and affect. Her behavior is normal. Judgment and thought content normal.  Vitals reviewed.     BP 132/80 mmHg  Pulse 76  Temp(Src) 98.1 F (36.7 C) (Oral)  Resp 16  Ht 5\' 3"  (1.6 m)  Wt 147 lb (66.679 kg)  BMI 26.05 kg/m2  SpO2 98% Wt Readings from Last 3 Encounters:  07/29/15 147 lb (66.679 kg)  07/08/15 150 lb 6.4 oz (68.221 kg)  06/30/15 147 lb 11.3 oz (67 kg)   Results for orders placed or performed in visit on 07/29/15  POCT urinalysis dipstick  Result Value Ref Range   Color, UA yellow yellow   Clarity, UA hazy (A) clear   Glucose, UA negative negative   Bilirubin, UA negative negative   Ketones, POC UA negative negative   Spec Grav, UA 1.015    Blood, UA small (A) negative   pH, UA 6.0    Protein Ur, POC negative negative   Urobilinogen, UA 0.2    Nitrite, UA Negative Negative   Leukocytes, UA large (3+) (A) Negative  POCT Microscopic Urinalysis ([facility])  Result Value Ref Range   WBC,UR,HPF,POC Too numerous to count  (A) None WBC/hpf   RBC,UR,HPF,POC Few (A) None RBC/hpf   Bacteria Many (A) None, Too numerous to count   Mucus Absent Absent   Epithelial Cells, UR Per Microscopy Few (A) None, Too numerous to count cells/hpf   Dg Forearm Left  07/29/2015  CLINICAL DATA:  Left forearm pain and swelling after fall. Initial encounter. EXAM: LEFT FOREARM - 2 VIEW COMPARISON:  None. FINDINGS: There is no evidence of fracture or other focal bone lesions. Soft tissues are unremarkable. IMPRESSION: Normal left forearm. Electronically Signed   By: Marijo Conception, M.D.   On: 07/29/2015 15:53       Assessment & Plan:  1. Fall, initial encounter - DG Forearm Left; Future  2. Left arm pain - negative xray, suspect sprain, treat with tylenol, can try heat - DG Forearm Left; Future  3. Dysuria - POCT urinalysis dipstick - POCT Microscopic  Urinalysis ([facility])  4. Dark urine - POCT urinalysis dipstick - POCT Microscopic Urinalysis ([facility])  5. Cystitis - cephALEXin (KEFLEX) 500 MG capsule; Take 1 capsule (500 mg total) by mouth 3 (three) times daily.  Dispense: 21 capsule; Refill: 0 - Urine culture - RTC precautions reviewed with patient and her caregiver  Clarene Reamer, FNP-BC  [facility], [facility]  07/29/2015 7:13 PM

## 2015-07-29 NOTE — Patient Instructions (Addendum)
IF you received an x-ray today, you will receive an invoice from [facility]. Please contact [facility] at [phone] with questions or concerns regarding your invoice.   IF you received labwork today, you will receive an invoice from [employer]. Please contact [facility] at [phone] with questions or concerns regarding your invoice.   Our billing staff will not be able to assist you with questions regarding bills from these companies.  You will be contacted with the lab results as soon as they are available. The fastest way to get your results is to activate your My Chart account. Instructions are located on the last page of this paperwork. If you have not heard from [place] regarding the results in 2 weeks, please contact this office.     Cystitis Urinary Tract Infection Urinary tract infections (UTIs) can develop anywhere along your urinary tract. Your urinary tract is your body's drainage system for removing wastes and extra water. Your urinary tract includes two kidneys, two ureters, a bladder, and a urethra. Your kidneys are a pair of bean-shaped organs. Each kidney is about the size of your fist. They are located below your ribs, one on each side of your spine. CAUSES Infections are caused by microbes, which are microscopic organisms, including fungi, viruses, and bacteria. These organisms are so small that they can only be seen through a microscope. Bacteria are the microbes that most commonly cause UTIs. SYMPTOMS  Symptoms of UTIs may vary by age and gender of the patient and by the location of the infection. Symptoms in young women typically include a frequent and intense urge to urinate and a painful, burning feeling in the bladder or urethra during urination. Older women and men are more likely to be tired, shaky, and weak and have muscle aches and abdominal pain. A fever may mean the infection is in your kidneys. Other symptoms of a  kidney infection include pain in your back or sides below the ribs, nausea, and vomiting. DIAGNOSIS To diagnose a UTI, your caregiver will ask you about your symptoms. Your caregiver will also ask you to provide a urine sample. The urine sample will be tested for bacteria and white blood cells. White blood cells are made by your body to help fight infection. TREATMENT  Typically, UTIs can be treated with medication. Because most UTIs are caused by a bacterial infection, they usually can be treated with the use of antibiotics. The choice of antibiotic and length of treatment depend on your symptoms and the type of bacteria causing your infection. HOME CARE INSTRUCTIONS  If you were prescribed antibiotics, take them exactly as your caregiver instructs you. Finish the medication even if you feel better after you have only taken some of the medication.  Drink enough water and fluids to keep your urine clear or pale yellow.  Avoid caffeine, tea, and carbonated beverages. They tend to irritate your bladder.  Empty your bladder often. Avoid holding urine for long periods of time.  Empty your bladder before and after sexual intercourse.  After a bowel movement, women should cleanse from front to back. Use each tissue only once. SEEK MEDICAL CARE IF:   You have back pain.  You develop a fever.  Your symptoms do not begin to resolve within 3 days. SEEK IMMEDIATE MEDICAL CARE IF:   You have severe back pain or lower abdominal pain.  You develop chills.  You have nausea or vomiting.  You have continued burning or discomfort with  urination. MAKE SURE YOU:   Understand these instructions.  Will watch your condition.  Will get help right away if you are not doing well or get worse.   This information is not intended to replace advice given to you by your health care provider. Make sure you discuss any questions you have with your health care provider.   Document Released: 11/09/2004  Document Revised: 10/21/2014 Document Reviewed: 03/10/2011 Elsevier Interactive Patient Education [employer].

## 2015-07-30 ENCOUNTER — Telehealth: Payer: Self-pay | Admitting: *Deleted

## 2015-07-30 NOTE — Telephone Encounter (Signed)
Daughter, Averyanna called and stated that she took her mother to the Urgent Care center at [facility] and they did a Urine and Urine Culture and placed patient on Cipro. Daughter called [place] wanting [place] to change it to something different. I instructed her to call the urgent care since they saw and evaluated her mother. She agreed.

## 2015-07-31 LAB — URINE CULTURE: Colony Count: >=100000

## 2015-08-03 ENCOUNTER — Other Ambulatory Visit: Payer: Self-pay | Admitting: Adult Health

## 2015-08-04 ENCOUNTER — Other Ambulatory Visit: Payer: Self-pay | Admitting: *Deleted

## 2015-08-04 DIAGNOSIS — R898 Other abnormal findings in specimens from other organs, systems and tissues: Secondary | ICD-10-CM | POA: Diagnosis not present

## 2015-08-04 DIAGNOSIS — Z01419 Encounter for gynecological examination (general) (routine) without abnormal findings: Secondary | ICD-10-CM | POA: Diagnosis not present

## 2015-08-04 DIAGNOSIS — Z6827 Body mass index (BMI) 27.0-27.9, adult: Secondary | ICD-10-CM | POA: Diagnosis not present

## 2015-08-04 MED ORDER — LOSARTAN POTASSIUM 25 MG PO TABS: 25.0000 mg | ORAL_TABLET | Freq: Every day | ORAL | Status: DC

## 2015-08-16 ENCOUNTER — Encounter: Payer: Self-pay | Admitting: Internal Medicine

## 2015-08-16 ENCOUNTER — Ambulatory Visit (INDEPENDENT_AMBULATORY_CARE_PROVIDER_SITE_OTHER): Payer: Medicare Other | Admitting: Internal Medicine

## 2015-08-16 VITALS — BP 133/73 | HR 81 | Ht 63.0 in | Wt 150.8 lb

## 2015-08-16 DIAGNOSIS — F03918 Unspecified dementia, unspecified severity, with other behavioral disturbance: Secondary | ICD-10-CM | POA: Insufficient documentation

## 2015-08-16 DIAGNOSIS — I4891 Unspecified atrial fibrillation: Secondary | ICD-10-CM | POA: Diagnosis not present

## 2015-08-16 DIAGNOSIS — R609 Edema, unspecified: Secondary | ICD-10-CM | POA: Diagnosis not present

## 2015-08-16 DIAGNOSIS — F0391 Unspecified dementia with behavioral disturbance: Secondary | ICD-10-CM

## 2015-08-16 DIAGNOSIS — I10 Essential (primary) hypertension: Secondary | ICD-10-CM

## 2015-08-16 DIAGNOSIS — R296 Repeated falls: Secondary | ICD-10-CM

## 2015-08-16 NOTE — Patient Instructions (Signed)
Your physician has recommended you make the following change in your medication: STOP losartan   Your physician wants you to follow-up in: 1 year with Dr. Debara Pickett. You will receive a reminder letter in the mail two months in advance. If you don't receive a letter, please call our office to schedule the follow-up appointment.

## 2015-08-16 NOTE — Progress Notes (Signed)
OFFICE NOTE  Chief Complaint:  Follow-up  Primary Care Physician: Hollace Kinnier, DO  HPI:  Ariana [name] an 82 year old female with history of some confusion in the past, hypertension and Sjogren syndrome. I had added lisinopril to her regimen which has helped control her blood pressure much better. She does report good control over her blood pressure over the past year; however, has had recent worsening problems such as cough, new productive sputum, problems with urinary tract infections. Weight is up back to 126 pounds after dip to 116 pounds but this is about where she is normally with a good appetite. Blood pressure is noted to be mildly elevated today; however, she is in some discomfort and has been short of breath with productive cough. Denies any chest pain. She does have pain in her back that shoots to her right thigh when sitting or leaning down. Today she has a number of other complaints. Her main issue is palpitations which she reports she had an episode of fast heart rate for almost one month but it has somewhat resolved. She still gets some recurrent palpitations.  I saw Ariana [name] back in the office today. She was recently seen by Ariana [name] and according to her notes his had marked progression of her dementia. She's now having significant behavioral disturbance as well as wandering. This is cause significant stress for her daughter and family. Compliance with medications is poor. There have been falls at home. Today in the office she is noted to be in new onset atrial fibrillation with rapid ventricular response. Heart rate is in the 120s. I had a long discussion with her daughter and her to the extent that she could understand about atrial fibrillation and her increased risk for stroke. Her CHADSVASC score is elevated at 4. She's also complaining of some abdominal discomfort but no significant chest pain. Blood pressure was low today at 90/70.  Unfortunately Ariana [name] returns today  for follow-up. She was recently hospitalized after an episode of transient global amnesia. She was ultimately found to have stroke with 2 clear punctate lesions on imaging. Is felt to be cardioembolic and may be related to her atrial fibrillation. She continues to have falls and in fact the fall was her initial presentation. As we discussed at length before she is at very high risk for complications on increased anticoagulation such as warfarin or a direct oral anticoagulant. Despite her CHADSVASC score now of 6, it is felt that anticoagulation is too risky. She has had an increase in her aspirin from 81 mg to 325 mg.  Ariana [name] returns today for follow-up. She continues to have significant behavioral issues related to dementia and possibly bipolar type symptoms. From a cardiac standpoint, her main complaints continue to be lower extremity swelling. She's not been fitted with compression stockings as it's been too difficult for her daughter to get her to [place]. She is requesting treatment for the edema although Ariana [name] reports that she does have significant incontinence. She continues to live at home and get some home assistance, but that is winding down after her recent stroke. She had another fall last week and I still feel is not a good candidate for anticoagulation beyond aspirin.  08/16/2015  I saw Ariana [name] back today in the office. Unfortunately she was recently admitted for UTI and was found to be bradycardic at the time. Beta blocker was discontinued although she remains on diltiazem. She was placed on low-dose losartan for additional blood  pressure control. Since then she's had some labile blood pressures including some blood pressures in the 90s and low [id number] systolic which is cause her to be dizzy and feel fatigued. Mention or some degree of autonomic dysfunction.  PMHx:  Past Medical History  Diagnosis Date  . Sjogren's syndrome ([place])   . Dry eye syndrome   . Hypertension, benign   .  Mitral valve prolapse   . GERD (gastroesophageal reflux disease)   . Diverticulosis of colon   . Irritable bowel syndrome   . Urinary incontinence   . Low back pain syndrome   . Fibromyalgia   . Memory loss   . Anxiety and depression   . History of adverse drug reaction   . Peripheral neuropathy (HCC)     "both feet and legs"  . History of recurrent UTIs   . H/O hiatal hernia   . Anxiety   . Dementia without behavioral disturbance   . Depression   . Abnormality of gait   . Thyroid nodule   . Adenomatous polyp of colon 2002    80mm  . History of cerebrovascular disease 09/24/2014  . Physical deconditioning   . Paroxysmal a-fib ([place])   . Therapeutic opioid-induced constipation (OIC)   . Dysthymic disorder   . Chronic back pain   . Dysphagia   . Allergic rhinitis     Past Surgical History  Procedure Laterality Date  . Vesicovaginal fistula closure w/ tah    . Appendectomy    . Cholecystectomy  2000  . Mandible surgery    . Temporomandibular joint surgery  1986    Dr. Terence Lux  . Cataract extraction, bilateral    . Abdominal hysterectomy  1967  . Dental surgery      multiple tooth extractions  . Esophagogastroduodenoscopy (egd) with esophageal dilation N/A 08/23/2012    Procedure: ESOPHAGOGASTRODUODENOSCOPY (EGD) WITH ESOPHAGEAL DILATION;  Surgeon: Milus Banister, MD;  Location: [facility];  Service: Endoscopy;  Laterality: N/A;  . Colonoscopy w/ biopsies      multiple  . Nasal septum surgery  1980  . Transthoracic echocardiogram  2001    mild LVH, normal LV  . Nm myocar perf wall motion  2003    persantine - normal static and dynamic study w/apical thinning and presvered LV function, no ischemia  . Cardiac catheterization  02/17/2003    normal L main, LAD free of disease, Cfx free of disease, RCA free of disease (Dr. Rockne Menghini)    FAMHx:  Family History  Problem Relation Age of Onset  . Heart disease Father     heart attack  . Pneumonia Mother   . Heart  attack Mother   . Hypertension Mother   . Hypertension Maternal Grandmother   . Colon cancer Sister   . Kidney disease Daughter   . Asthma Daughter   . Arthritis Daughter 53    osteo,  . Heart disease Son 14    stage 3 CHF(Diastolic /Systolic)  . Throat cancer Brother     SOCHx:   reports that she has quit smoking. She has never used smokeless tobacco. She reports that she does not drink alcohol or use illicit drugs.  ALLERGIES:  Allergies  Allergen Reactions  . Banana Nausea And Vomiting  . Codeine Nausea Only    unless given with Phenergan  . Klonopin [Clonazepam] Other (See Comments)    Causes hallucination   . Meperidine Hcl Nausea Only    unless given with Phenergan  . Norflex [  Orphenadrine Citrate] Nausea Only    Unless given with Phenergan  . Oxycodone-Acetaminophen Nausea Only    unless given with phenergan  . Propoxyphene Hcl Nausea Only    unless given with phenergan  . Zoloft [Sertraline Hcl] Other (See Comments)    Caused lethargy  . Doxycycline Other (See Comments)    Unknown  . Naproxen Other (See Comments)  . Penicillins Other (See Comments)    Unknown  . Phenothiazines Other (See Comments)    Unknown  . Stelazine Other (See Comments)    Unknown  . Sulfamethoxazole-Trimethoprim Other (See Comments)    Unknown  . Tolectin [Tolmetin Sodium] Other (See Comments)    Unknown  . Tramadol Other (See Comments)    Unknown    ROS: Pertinent items noted in HPI and remainder of comprehensive ROS otherwise negative.  HOME MEDS: Current Outpatient Prescriptions  Medication Sig Dispense Refill  . ALPRAZolam (XANAX) 0.5 MG tablet TAKE ONE TABLET BY MOUTH TWICE DAILY AS NEEDED FOR ANXIETY 60 tablet 0  . aspirin 325 MG tablet Take 1 tablet (325 mg total) by mouth daily. 30 tablet 2  . bisacodyl (DULCOLAX) 5 MG EC tablet Take 10 mg by mouth daily as needed for moderate constipation.    . calcium carbonate (TUMS - DOSED IN MG ELEMENTAL CALCIUM) 500 MG chewable  tablet Chew 1 tablet by mouth daily.    . carboxymethylcellulose (REFRESH TEARS) 0.5 % SOLN Place 1 drop into both eyes 2 (two) times daily as needed (dry eyes).     . cefpodoxime (VANTIN) 100 MG tablet Take 1 tablet (100 mg total) by mouth 2 (two) times daily. 10 tablet 0  . cephALEXin (KEFLEX) 500 MG capsule Take 1 capsule (500 mg total) by mouth 3 (three) times daily. 21 capsule 0  . diltiazem (CARTIA XT) 180 MG 24 hr capsule Take 1 capsule (180 mg total) by mouth daily. 90 capsule 1  . Diphenhyd-Hydrocort-Nystatin (FIRST-DUKES MOUTHWASH) SUSP 1 tsp garlge and swallow four times daily 240 mL 6  . docusate sodium (COLACE) 100 MG capsule Take 1 capsule (100 mg total) by mouth daily. 10 capsule 0  . DULoxetine (CYMBALTA) 30 MG capsule TAKE ONE CAPSULE BY MOUTH ONCE DAILY 30 capsule 1  . DULoxetine (CYMBALTA) 60 MG capsule Take one tablet by mouth in the evening once daily for depression 30 capsule 5  . fluticasone-salmeterol (ADVAIR HFA) 45-21 MCG/ACT inhaler Inhale 2 puffs into the lungs 2 (two) times daily.    Marland Kitchen HYDROcodone-acetaminophen (NORCO/VICODIN) 5-325 MG tablet Take 1 tablet by mouth every 6 (six) hours as needed for moderate pain. (Patient taking differently: Take 1 tablet by mouth every 6 (six) hours as needed for moderate pain or severe pain. ) 120 tablet 0  . hydroxychloroquine (PLAQUENIL) 200 MG tablet TAKE ONE TABLET BY MOUTH TWICE DAILY 60 tablet 1  . losartan (COZAAR) 25 MG tablet Take 1 tablet (25 mg total) by mouth daily. 30 tablet 3  . lubiprostone (AMITIZA) 24 MCG capsule Take 1 capsule (24 mcg total) by mouth 2 (two) times daily with a meal. 60 capsule 11  . LYRICA 75 MG capsule TAKE ONE CAPSULE BY MOUTH TWICE DAILY 60 capsule 0  . magic mouthwash w/lidocaine SOLN Take 5 mLs by mouth 4 (four) times daily.    . memantine (NAMENDA) 10 MG tablet Take 1 tablet (10 mg total) by mouth 2 (two) times daily. 60 tablet 5  . mirabegron ER (MYRBETRIQ) 25 MG TB24 tablet Take 1 tablet (25  mg total) by mouth daily. 30 tablet 3  . omeprazole (PRILOSEC) 20 MG capsule Take 20 mg by mouth daily.    . pilocarpine (SALAGEN) 5 MG tablet Take 5 mg by mouth 2 (two) times daily.    Marland Kitchen senna (SENOKOT) 8.6 MG TABS tablet Take 2 tablets (17.2 mg total) by mouth daily. 120 each 0  . sodium chloride (OCEAN) 0.65 % SOLN nasal spray Place 1 spray into the nose as needed for congestion. 30 mL 0   No current facility-administered medications for this visit.    LABS/IMAGING: No results found for this or any previous visit (from the past 48 hour(s)). No results found.  VITALS: BP 133/73 mmHg  Pulse 81  Ht 5\' 3"  (1.6 m)  Wt 150 lb 12.8 oz (68.402 kg)  BMI 26.72 kg/m2  EXAM: General appearance: alert and no distress Neck: no carotid bruit and no JVD Lungs: clear to auscultation bilaterally Heart: regular rate and rhythm, S1, S2 normal, no murmur, click, rub or gallop Abdomen: soft, non-tender; bowel sounds normal; no masses,  no organomegaly Extremities: edema trace Pulses: 2+ and symmetric Skin: Skin color, texture, turgor normal. No rashes or lesions Neurologic: Grossly normal Psych: Pleasant  EKG: Sinus rhythm with first-degree AV block at 81  ASSESSMENT: 1. New-onset paroxysmal atrial fibrillation with rapid ventricular response - CHADSVASC 6 (not on warfarin due to falls) 2. Recent stroke 3. Labile hypertension - allowing higher BP than normal 4. Chest wall pain 5. Palpitations 6. Sjogren syndrome 7. Progressive dementia with behavioral disturbance 8. Falls and medication noncompliance 9. Asymmetric left lower extremity edema 10.   PLAN: 1.   Ms. Guile recently had bradycardia on beta blocker and this was discontinued. Heart rate is improved. She has PAF but is not on anticoagulation due to falls. She also has labile hypertension and recently has had low blood pressures. I've advised to discontinue her losartan and allow blood pressure run higher in the 0000000 or [id number]  systolic to allow some room for occasional blood pressure drops. No other changes to her medicines today and we'll see her back annually or sooner as necessary.   Pixie Casino, MD, [facility] Attending Cardiologist Pease C Kurt Azimi 08/16/2015, 2:39 PM

## 2015-08-21 ENCOUNTER — Other Ambulatory Visit: Payer: Self-pay | Admitting: Adult Health

## 2015-08-21 ENCOUNTER — Other Ambulatory Visit: Payer: Self-pay | Admitting: Internal Medicine

## 2015-08-25 ENCOUNTER — Other Ambulatory Visit: Payer: Self-pay

## 2015-08-25 MED ORDER — LUBIPROSTONE 24 MCG PO CAPS: 24.0000 ug | ORAL_CAPSULE | Freq: Two times a day (BID) | ORAL | Status: DC

## 2015-08-25 NOTE — Telephone Encounter (Signed)
Received refill request from [facility]

## 2015-09-06 ENCOUNTER — Encounter: Payer: Self-pay | Admitting: Internal Medicine

## 2015-09-06 ENCOUNTER — Ambulatory Visit (INDEPENDENT_AMBULATORY_CARE_PROVIDER_SITE_OTHER): Payer: Medicare Other | Admitting: Internal Medicine

## 2015-09-06 VITALS — BP 122/68 | HR 76 | Temp 97.6°F | Wt 152.0 lb

## 2015-09-06 DIAGNOSIS — I4891 Unspecified atrial fibrillation: Secondary | ICD-10-CM

## 2015-09-06 DIAGNOSIS — F03918 Unspecified dementia, unspecified severity, with other behavioral disturbance: Secondary | ICD-10-CM

## 2015-09-06 DIAGNOSIS — F341 Dysthymic disorder: Secondary | ICD-10-CM

## 2015-09-06 DIAGNOSIS — T402X5A Adverse effect of other opioids, initial encounter: Secondary | ICD-10-CM

## 2015-09-06 DIAGNOSIS — F0391 Unspecified dementia with behavioral disturbance: Secondary | ICD-10-CM

## 2015-09-06 DIAGNOSIS — G8929 Other chronic pain: Secondary | ICD-10-CM | POA: Diagnosis not present

## 2015-09-06 DIAGNOSIS — E042 Nontoxic multinodular goiter: Secondary | ICD-10-CM | POA: Diagnosis not present

## 2015-09-06 DIAGNOSIS — K5903 Drug induced constipation: Secondary | ICD-10-CM

## 2015-09-06 DIAGNOSIS — M549 Dorsalgia, unspecified: Secondary | ICD-10-CM | POA: Diagnosis not present

## 2015-09-06 DIAGNOSIS — N3946 Mixed incontinence: Secondary | ICD-10-CM | POA: Diagnosis not present

## 2015-09-06 MED ORDER — PREGABALIN 75 MG PO CAPS: 75.0000 mg | ORAL_CAPSULE | Freq: Two times a day (BID) | ORAL | 3 refills | Status: DC

## 2015-09-06 MED ORDER — HYDROCODONE-ACETAMINOPHEN 5-325 MG PO TABS: 1.0000 | ORAL_TABLET | Freq: Four times a day (QID) | ORAL | 0 refills | Status: DC | PRN

## 2015-09-06 MED ORDER — DULOXETINE HCL 30 MG PO CPEP: 30.0000 mg | ORAL_CAPSULE | Freq: Every day | ORAL | 3 refills | Status: DC

## 2015-09-06 NOTE — Patient Instructions (Signed)
I need you to drink 6 8oz glasses of water daily to prevent urine infections.

## 2015-09-06 NOTE — Progress Notes (Signed)
Location:  [facility] clinic Provider:  Atavia Poppe L. Mariea Clonts, D.O., C.M.D.  Code Status: DNR Goals of Care:  Advanced Directives 09/06/2015  Does patient have an advance directive? Yes  Type of Advance Directive Healthcare Power of Attorney  Does patient want to make changes to advanced directive? -  Copy of advanced directive(s) in chart? -  Would patient like information on creating an advanced directive? -  Pre-existing out of facility DNR order (yellow form or pink MOST form) -     Chief Complaint  Patient presents with  . Medical Management of Chronic Issues    3 mth follow-up    HPI: Patient is a 82 y.o. female seen today for medical management of chronic diseases.    Had bp very high once of 195/90.  Rest in 90s-100s/60s-70s.  Pt reports her bp is high when palpitations are going on, but caregiver and daughter don't notice it.    Switched from frozen veggies to canned veggies (doesn't like bagged green beans).    Still has small bowels of stools.  Doing the miralax and still doing the colace.  Eddie asked if it's ok for her to take 3 colace.  Sleeping well and spirits are fair, she reports.    She has continued to have dysuria since 5/16.  She has been treated at the hospital for multiple utis.  Still not hydrating well.  Not drinking cranberry juice.    Past Medical History:  Diagnosis Date  . Abnormality of gait   . Adenomatous polyp of colon 2002   56mm  . Allergic rhinitis   . Anxiety   . Anxiety and depression   . Chronic back pain   . Dementia without behavioral disturbance   . Depression   . Diverticulosis of colon   . Dry eye syndrome   . Dysphagia   . Dysthymic disorder   . Fibromyalgia   . GERD (gastroesophageal reflux disease)   . H/O hiatal hernia   . History of adverse drug reaction   . History of cerebrovascular disease 09/24/2014  . History of recurrent UTIs   . Hypertension, benign   . Irritable bowel syndrome   . Low back pain syndrome   . Memory  loss   . Mitral valve prolapse   . Paroxysmal a-fib ([place])   . Peripheral neuropathy (HCC)    "both feet and legs"  . Physical deconditioning   . Sjogren's syndrome ([place])   . Therapeutic opioid-induced constipation (OIC)   . Thyroid nodule   . Urinary incontinence     Past Surgical History:  Procedure Laterality Date  . ABDOMINAL HYSTERECTOMY  1967  . APPENDECTOMY    . CARDIAC CATHETERIZATION  02/17/2003   normal L main, LAD free of disease, Cfx free of disease, RCA free of disease (Dr. Loni Muse. [facility])  . CATARACT EXTRACTION, BILATERAL    . CHOLECYSTECTOMY  2000  . COLONOSCOPY W/ BIOPSIES     multiple  . DENTAL SURGERY     multiple tooth extractions  . ESOPHAGOGASTRODUODENOSCOPY (EGD) WITH ESOPHAGEAL DILATION N/A 08/23/2012   Procedure: ESOPHAGOGASTRODUODENOSCOPY (EGD) WITH ESOPHAGEAL DILATION;  Surgeon: Milus Banister, MD;  Location: [facility];  Service: Endoscopy;  Laterality: N/A;  . MANDIBLE SURGERY    . NASAL SEPTUM SURGERY  1980  . NM MYOCAR PERF WALL MOTION  2003   persantine - normal static and dynamic study w/apical thinning and presvered LV function, no ischemia  . TEMPOROMANDIBULAR JOINT SURGERY  1986   Dr. Terence Lux  .  TRANSTHORACIC ECHOCARDIOGRAM  2001   mild LVH, normal LV  . VESICOVAGINAL FISTULA CLOSURE W/ TAH      Allergies  Allergen Reactions  . Banana Nausea And Vomiting  . Codeine Nausea Only    unless given with Phenergan  . Klonopin [Clonazepam] Other (See Comments)    Causes hallucination   . Meperidine Hcl Nausea Only    unless given with Phenergan  . Norflex [Orphenadrine Citrate] Nausea Only    Unless given with Phenergan  . Oxycodone-Acetaminophen Nausea Only    unless given with phenergan  . Propoxyphene Hcl Nausea Only    unless given with phenergan  . Zoloft [Sertraline Hcl] Other (See Comments)    Caused lethargy  . Doxycycline Other (See Comments)    Unknown  . Naproxen Other (See Comments)  . Penicillins Other (See Comments)     Unknown  . Phenothiazines Other (See Comments)    Unknown  . Stelazine Other (See Comments)    Unknown  . Sulfamethoxazole-Trimethoprim Other (See Comments)    Unknown  . Tolectin [Tolmetin Sodium] Other (See Comments)    Unknown  . Tramadol Other (See Comments)    Unknown      Medication List       Accurate as of 09/06/15 11:59 PM. Always use your most recent med list.          ALPRAZolam 0.5 MG tablet Commonly known as:  XANAX TAKE ONE TABLET BY MOUTH TWICE DAILY AS NEEDED FOR ANXIETY   aspirin 325 MG tablet Take 1 tablet (325 mg total) by mouth daily.   bisacodyl 5 MG EC tablet Commonly known as:  DULCOLAX Take 10 mg by mouth daily as needed for moderate constipation.   calcium carbonate 500 MG chewable tablet Commonly known as:  TUMS - dosed in mg elemental calcium Chew 1 tablet by mouth daily.   diltiazem 180 MG 24 hr capsule Commonly known as:  CARTIA XT Take 1 capsule (180 mg total) by mouth daily.   docusate sodium 100 MG capsule Commonly known as:  COLACE Take 1 capsule (100 mg total) by mouth daily.   DULoxetine 60 MG capsule Commonly known as:  CYMBALTA Take one tablet by mouth in the evening once daily for depression   DULoxetine 30 MG capsule Commonly known as:  CYMBALTA Take 1 capsule (30 mg total) by mouth daily. For back pain and depression   FIRST-DUKES MOUTHWASH Susp 1 tsp garlge and swallow four times daily   fluticasone-salmeterol 45-21 MCG/ACT inhaler Commonly known as:  ADVAIR HFA Inhale 2 puffs into the lungs 2 (two) times daily.   HYDROcodone-acetaminophen 5-325 MG tablet Commonly known as:  NORCO/VICODIN Take 1 tablet by mouth every 6 (six) hours as needed for moderate pain.   hydroxychloroquine 200 MG tablet Commonly known as:  PLAQUENIL TAKE ONE TABLET BY MOUTH TWICE DAILY   lubiprostone 24 MCG capsule Commonly known as:  AMITIZA Take 1 capsule (24 mcg total) by mouth 2 (two) times daily with a meal.   memantine 10 MG  tablet Commonly known as:  NAMENDA Take 1 tablet (10 mg total) by mouth 2 (two) times daily.   mirabegron ER 25 MG Tb24 tablet Commonly known as:  MYRBETRIQ Take 1 tablet (25 mg total) by mouth daily.   omeprazole 20 MG capsule Commonly known as:  PRILOSEC Take 20 mg by mouth daily.   pilocarpine 5 MG tablet Commonly known as:  SALAGEN Take 5 mg by mouth 2 (two) times daily.  pregabalin 75 MG capsule Commonly known as:  LYRICA Take 1 capsule (75 mg total) by mouth 2 (two) times daily. For pain   REFRESH TEARS 0.5 % Soln Generic drug:  carboxymethylcellulose Place 1 drop into both eyes 2 (two) times daily as needed (dry eyes).   senna 8.6 MG Tabs tablet Commonly known as:  SENOKOT Take 2 tablets (17.2 mg total) by mouth daily.   sodium chloride 0.65 % Soln nasal spray Commonly known as:  OCEAN Place 1 spray into the nose as needed for congestion.       Review of Systems:  Review of Systems  Constitutional: Positive for malaise/fatigue. Negative for chills, fever and weight loss.  HENT: Positive for hearing loss.   Respiratory: Negative for cough and shortness of breath.   Cardiovascular: Positive for leg swelling. Negative for chest pain and palpitations.  Gastrointestinal: Positive for constipation. Negative for abdominal pain, blood in stool and melena.  Genitourinary: Positive for frequency and urgency. Negative for dysuria, flank pain and hematuria.  Musculoskeletal: Positive for back pain, falls, joint pain and myalgias.  Skin: Negative for itching and rash.  Neurological: Negative for loss of consciousness and weakness.  Endo/Heme/Allergies: Does not bruise/bleed easily.  Psychiatric/Behavioral: Positive for depression and memory loss. The patient is nervous/anxious.     Health Maintenance  Topic Date Due  . DEXA SCAN  07/04/1996  . INFLUENZA VACCINE  09/14/2015  . MAMMOGRAM  07/26/2016  . TETANUS/TDAP  12/09/2023  . ZOSTAVAX  Completed  . PNA vac Low  Risk Adult  Completed    Physical Exam: Vitals:   09/06/15 1438  BP: 122/68  Pulse: 76  Temp: 97.6 F (36.4 C)  TempSrc: Oral  SpO2: 97%  Weight: 152 lb (68.9 kg)   Body mass index is 26.93 kg/m. Physical Exam  Constitutional: She appears well-developed and well-nourished. No distress.  Cardiovascular: Intact distal pulses.   irreg irreg  Pulmonary/Chest: Effort normal and breath sounds normal. No respiratory distress.  Abdominal: Soft. Bowel sounds are normal. She exhibits distension. There is no tenderness.  Musculoskeletal: Normal range of motion. She exhibits no tenderness.  Neurological: She is alert.  Oriented to person and place  Skin: Skin is warm and dry. There is pallor.  Psychiatric:  Spirits better today, did complain about several things from past hospitalizations    Labs reviewed: Basic Metabolic Panel:  Recent Labs  04/19/15 04/19/15 0349  06/29/15 1600 06/30/15 0325 06/30/15 1214 07/01/15 0529 07/08/15 1241  NA  --   --   < >  --  142  --  143 139  K  --   --   < >  --  4.0  --  3.4* 4.6  CL  --   --   < >  --  109  --  107 96  CO2  --   --   < >  --  28  --  31 28  GLUCOSE  --   --   < >  --  100*  --  120* 94  BUN  --   --   < >  --  21*  --  15 22  CREATININE  --   --   < >  --  0.65  --  0.71 0.73  CALCIUM  --   --   < >  --  9.0  --  9.6 10.2  MG  --   --   --  2.0  --   --   --   --  PHOS  --   --   --  3.5  --   --   --   --   TSH 1.68 1.680  --   --   --  0.889  --   --   < > = values in this interval not displayed. Liver Function Tests:  Recent Labs  06/29/15 1556 06/30/15 0325 07/01/15 0529  AST 23 23 24   ALT 23 19 19   ALKPHOS 94 76 83  BILITOT 0.5 0.4 0.4  PROT 7.3 6.2* 6.8  ALBUMIN 4.4 3.6 4.0    Recent Labs  12/07/14 1409  LIPASE 36    Recent Labs  12/04/14 0815  AMMONIA 28   CBC:  Recent Labs  06/29/15 1556 06/30/15 0325 07/01/15 0529  WBC 6.4 5.2 6.5  NEUTROABS 3.1 2.4 3.6  HGB 13.4 11.7* 12.5    HCT 40.3 35.4* 37.5  MCV 89.4 91.2 90.6  PLT 203 170 181   Lipid Panel:  Recent Labs  12/17/14 0215  CHOL 121  HDL 45  LDLCALC 60  TRIG 78  CHOLHDL 2.7   Lab Results  Component Value Date   HGBA1C 6.4 (H) 04/19/2015   Assessment/Plan 1. Therapeutic opioid induced constipation -take amitiza, 3 colace and miralax daily -hydrate  2. Senile dementia, with behavioral disturbance -tells stories from the past -not helped by anxiety and depression and pain meds, but she needs these and attempts at taper have not gone well  3. ANXIETY DEPRESSION -cont cymbalta and xanax -seemed a little better this time--has same caregiver for a while which seems to be a good thing  4. Chronic back pain - cont hydrocodone and cymbalta for pain  5. Mixed incontinence urge and stress - cont kegel exercise  - cont myrbetriq 25mg  daily  6. Multinodular thyroid -cont to monitor but last thyroid tests normal during latest hospitalization  7. Atrial fibrillation with RVR (HCC) -cont on diltiazem, asa 325mg   Labs/tests ordered:  No new today Next appt:  3 mos with labs day of appt  Keirah Konitzer L. Meli Faley, D.O. [facility] [address], [place] [address] Cell Phone (Mon-Fri 8am-5pm):  [phone] On Call:  [phone] & follow prompts after 5pm & weekends Office Phone:  [phone] Office Fax:  [phone]

## 2015-09-24 DIAGNOSIS — Z961 Presence of intraocular lens: Secondary | ICD-10-CM | POA: Diagnosis not present

## 2015-09-24 DIAGNOSIS — Z79899 Other long term (current) drug therapy: Secondary | ICD-10-CM | POA: Diagnosis not present

## 2015-09-24 DIAGNOSIS — H04123 Dry eye syndrome of bilateral lacrimal glands: Secondary | ICD-10-CM | POA: Diagnosis not present

## 2015-09-27 ENCOUNTER — Other Ambulatory Visit: Payer: Self-pay | Admitting: *Deleted

## 2015-09-27 MED ORDER — ALPRAZOLAM 0.5 MG PO TABS: 0.5000 mg | ORAL_TABLET | Freq: Two times a day (BID) | ORAL | 0 refills | Status: DC | PRN

## 2015-09-27 NOTE — Telephone Encounter (Signed)
Patient daughter Nikyra requested to be faxed to pharmacy. Faxed.

## 2015-10-06 ENCOUNTER — Telehealth: Payer: Self-pay | Admitting: *Deleted

## 2015-10-06 ENCOUNTER — Encounter: Payer: Self-pay | Admitting: Neurology

## 2015-10-06 ENCOUNTER — Ambulatory Visit (INDEPENDENT_AMBULATORY_CARE_PROVIDER_SITE_OTHER): Payer: Medicare Other | Admitting: Neurology

## 2015-10-06 VITALS — BP 130/72 | HR 80 | Resp 14 | Ht 63.0 in | Wt 151.0 lb

## 2015-10-06 DIAGNOSIS — F0390 Unspecified dementia without behavioral disturbance: Secondary | ICD-10-CM | POA: Diagnosis not present

## 2015-10-06 DIAGNOSIS — R269 Unspecified abnormalities of gait and mobility: Secondary | ICD-10-CM | POA: Diagnosis not present

## 2015-10-06 MED ORDER — MEMANTINE HCL 10 MG PO TABS: 10.0000 mg | ORAL_TABLET | Freq: Two times a day (BID) | ORAL | 5 refills | Status: DC

## 2015-10-06 NOTE — Telephone Encounter (Signed)
I called the daughter. The memory issues are very stable, we are going to keep her on the Rea. The daughter is to call back she has any further questions.

## 2015-10-06 NOTE — Progress Notes (Signed)
Reason for visit: Memory disturbance  Ariana [name] is an 82 y.o. female  History of present illness:  Ariana [name] is an 82 year old right-handed white female with a history of a mild memory disturbance. Ariana [name] has some gait instability issues, she uses a walker for ambulation. She has not had any recent falls, but she has been to [facility] hospital on several occasions oftentimes associated with a urinary tract infection. Ariana [name] reports some difficulty with constipation as well, and she reports some foot pain with weightbearing that is worse in Ariana morning. She is on Namenda, she does not report any significant change in her cognitive status since last seen.  Past Medical History:  Diagnosis Date  . Abnormality of gait   . Adenomatous polyp of colon 2002   78mm  . Allergic rhinitis   . Anxiety   . Anxiety and depression   . Chronic back pain   . Dementia without behavioral disturbance   . Depression   . Diverticulosis of colon   . Dry eye syndrome   . Dysphagia   . Dysthymic disorder   . Fibromyalgia   . GERD (gastroesophageal reflux disease)   . H/O hiatal hernia   . History of adverse drug reaction   . History of cerebrovascular disease 09/24/2014  . History of recurrent UTIs   . Hypertension, benign   . Irritable bowel syndrome   . Low back pain syndrome   . Memory loss   . Mitral valve prolapse   . Paroxysmal a-fib ([place])   . Peripheral neuropathy (HCC)    "both feet and legs"  . Physical deconditioning   . Sjogren's syndrome ([place])   . Therapeutic opioid-induced constipation (OIC)   . Thyroid nodule   . Urinary incontinence     Past Surgical History:  Procedure Laterality Date  . ABDOMINAL HYSTERECTOMY  1967  . APPENDECTOMY    . CARDIAC CATHETERIZATION  02/17/2003   normal L main, LAD free of disease, Cfx free of disease, RCA free of disease (Dr. Loni Muse. Little)  . CATARACT EXTRACTION, BILATERAL    . CHOLECYSTECTOMY  2000  . COLONOSCOPY W/ BIOPSIES     multiple  . DENTAL SURGERY     multiple tooth extractions  . ESOPHAGOGASTRODUODENOSCOPY (EGD) WITH ESOPHAGEAL DILATION N/A 08/23/2012   Procedure: ESOPHAGOGASTRODUODENOSCOPY (EGD) WITH ESOPHAGEAL DILATION;  Surgeon: Milus Banister, MD;  Location: [facility];  Service: Endoscopy;  Laterality: N/A;  . MANDIBLE SURGERY    . NASAL SEPTUM SURGERY  1980  . NM MYOCAR PERF WALL MOTION  2003   persantine - normal static and dynamic study w/apical thinning and presvered LV function, no ischemia  . TEMPOROMANDIBULAR JOINT SURGERY  1986   Dr. Terence Lux  . TRANSTHORACIC ECHOCARDIOGRAM  2001   mild LVH, normal LV  . VESICOVAGINAL FISTULA CLOSURE W/ TAH      Family History  Problem Relation Age of Onset  . Heart disease Father     heart attack  . Pneumonia Mother   . Heart attack Mother   . Hypertension Mother   . Colon cancer Sister   . Kidney disease Daughter   . Asthma Daughter   . Arthritis Daughter 22    osteo,  . Heart disease Son 4    stage 3 CHF(Diastolic /Systolic)  . Throat cancer Brother   . Hypertension Maternal Grandmother     Social history:  reports that she has quit smoking. She has never used smokeless tobacco. She reports that  she does not drink alcohol or use drugs.    Allergies  Allergen Reactions  . Banana Nausea And Vomiting  . Codeine Nausea Only    unless given with Phenergan  . Klonopin [Clonazepam] Other (See Comments)    Causes hallucination   . Meperidine Hcl Nausea Only    unless given with Phenergan  . Norflex [Orphenadrine Citrate] Nausea Only    Unless given with Phenergan  . Oxycodone-Acetaminophen Nausea Only    unless given with phenergan  . Propoxyphene Hcl Nausea Only    unless given with phenergan  . Zoloft [Sertraline Hcl] Other (See Comments)    Caused lethargy  . Doxycycline Other (See Comments)    Unknown  . Naproxen Other (See Comments)  . Penicillins Other (See Comments)    Unknown  . Phenothiazines Other (See Comments)     Unknown  . Stelazine Other (See Comments)    Unknown  . Sulfamethoxazole-Trimethoprim Other (See Comments)    Unknown  . Tolectin [Tolmetin Sodium] Other (See Comments)    Unknown  . Tramadol Other (See Comments)    Unknown    Medications:  Prior to Admission medications   Medication Sig Start Date End Date Taking? Authorizing Provider  ALPRAZolam Duanne Moron) 0.5 MG tablet Take 1 tablet (0.5 mg total) by mouth 2 (two) times daily as needed. for anxiety 09/27/15  Yes Tiffany L Reed, DO  aspirin 325 MG tablet Take 1 tablet (325 mg total) by mouth daily. 07/28/14  Yes Reyne Dumas, MD  bisacodyl (DULCOLAX) 5 MG EC tablet Take 10 mg by mouth daily as needed for moderate constipation.   Yes Historical Provider, MD  calcium carbonate (TUMS - DOSED IN MG ELEMENTAL CALCIUM) 500 MG chewable tablet Chew 1 tablet by mouth daily.   Yes Historical Provider, MD  carboxymethylcellulose (REFRESH TEARS) 0.5 % SOLN Place 1 drop into both eyes 2 (two) times daily as needed (dry eyes).    Yes Historical Provider, MD  diltiazem (CARTIA XT) 180 MG 24 hr capsule Take 1 capsule (180 mg total) by mouth daily. 07/26/15  Yes Pixie Casino, MD  Diphenhyd-Hydrocort-Nystatin (FIRST-DUKES MOUTHWASH) SUSP 1 tsp garlge and swallow four times daily 06/03/15  Yes Tiffany L Reed, DO  docusate sodium (COLACE) 100 MG capsule Take 1 capsule (100 mg total) by mouth daily. 07/01/15  Yes Debbe Odea, MD  DULoxetine (CYMBALTA) 30 MG capsule Take 1 capsule (30 mg total) by mouth daily. For back pain and depression 09/06/15  Yes Tiffany L Reed, DO  DULoxetine (CYMBALTA) 60 MG capsule Take one tablet by mouth in [place] evening once daily for depression 07/13/15  Yes Tiffany L Reed, DO  fluticasone-salmeterol (ADVAIR HFA) 45-21 MCG/ACT inhaler Inhale 2 puffs into Ariana lungs 2 (two) times daily.   Yes Historical Provider, MD  HYDROcodone-acetaminophen (NORCO/VICODIN) 5-325 MG tablet Take 1 tablet by mouth every 6 (six) hours as needed for moderate  pain. 09/06/15  Yes Tiffany L Reed, DO  hydroxychloroquine (PLAQUENIL) 200 MG tablet TAKE ONE TABLET BY MOUTH TWICE DAILY 08/03/15  Yes Tiffany L Reed, DO  lubiprostone (AMITIZA) 24 MCG capsule Take 1 capsule (24 mcg total) by mouth 2 (two) times daily with a meal. 08/25/15  Yes Tiffany L Reed, DO  memantine (NAMENDA) 10 MG tablet Take 1 tablet (10 mg total) by mouth 2 (two) times daily. 04/07/15  Yes Kathrynn Ducking, MD  mirabegron ER (MYRBETRIQ) 25 MG TB24 tablet Take 1 tablet (25 mg total) by mouth daily. 01/04/15  Yes  Tiffany L Reed, DO  omeprazole (PRILOSEC) 20 MG capsule Take 20 mg by mouth daily.   Yes Historical Provider, MD  pilocarpine (SALAGEN) 5 MG tablet Take 5 mg by mouth 2 (two) times daily.   Yes Historical Provider, MD  pregabalin (LYRICA) 75 MG capsule Take 1 capsule (75 mg total) by mouth 2 (two) times daily. For pain 09/06/15  Yes Tiffany L Reed, DO  senna (SENOKOT) 8.6 MG TABS tablet Take 2 tablets (17.2 mg total) by mouth daily. 07/01/15  Yes Debbe Odea, MD  sodium chloride (OCEAN) 0.65 % SOLN nasal spray Place 1 spray into Ariana nose as needed for congestion. 08/23/12  Yes Modena Jansky, MD    ROS:  Out of a complete 14 system review of symptoms, Ariana [name] complains only of Ariana following symptoms, and all other reviewed systems are negative.  Decreased appetite, fatigue, increased weight Runny nose Eye itching, eye redness, eye pain Shortness of breath Chest pain, leg swelling, palpitations of Ariana heart Flushing Swollen abdomen, abdominal pain, rectal bleeding, constipation, rectal pain Daytime sleepiness Frequent infections Memory loss  Blood pressure 130/72, pulse 80, resp. rate 14, height 5\' 3"  (1.6 m), weight 151 lb (68.5 kg).  Physical Exam  General: Ariana [name] is alert and cooperative at Ariana time of Ariana examination.  Skin: No significant peripheral edema is noted.   Neurologic Exam  Mental status: Ariana [name] is alert and oriented x 3 at Ariana time of  Ariana examination. Ariana [name] has apparent normal recent and remote memory, with an apparently normal attention span and concentration ability.Mini-Mental Status Examination done today shows a total score 28/30. Ariana [name] is able to name 9 animals in 30 seconds.   Cranial nerves: Facial symmetry is present. Speech is normal, no aphasia or dysarthria is noted. Extraocular movements are full. Visual fields are full.  Motor: Ariana [name] has good strength in all 4 extremities.  Sensory examination: Soft touch sensation is symmetric on Ariana face, arms, and legs.  Coordination: Ariana [name] has good finger-nose-finger and heel-to-shin bilaterally.  Gait and station: Ariana [name] has a slightly wide-based gait, Ariana [name] walks with a walker. Romberg is negative.  Reflexes: Deep tendon reflexes are symmetric.   CT head 04/18/15:  IMPRESSION: Stable atrophy, chronic small vessel ischemia, and remote infarcts. No CT findings of superimposed acute abnormality.  * CT scan images were reviewed online. I agree with Ariana written report.    Assessment/Plan:  1. Mild memory disturbance  2. Gait disturbance  Ariana [name] is doing remarkably well with her memory, she has not progressed much since last seen. She will continue [facility] Namenda, she will follow-up in 9 months, sooner if needed.  Jill Alexanders MD 10/06/2015 2:45 PM  [facility] [address] [place], [place] [address]  Phone [phone] Fax [phone]

## 2015-10-22 ENCOUNTER — Other Ambulatory Visit: Payer: Self-pay | Admitting: Internal Medicine

## 2015-10-23 ENCOUNTER — Other Ambulatory Visit: Payer: Self-pay | Admitting: Internal Medicine

## 2015-11-02 ENCOUNTER — Telehealth: Payer: Self-pay | Admitting: Internal Medicine

## 2015-11-02 NOTE — Telephone Encounter (Signed)
Daughter reports constipation with hard stools and some rectal bleeding.  She would like to have a colonoscopy.  Daughter notified she will need an office visit to discuss.  She will come in and see Ellouise Newer, [place] on 11/16/15 1:15.  She will add another dose mirlax in the am.

## 2015-11-05 ENCOUNTER — Other Ambulatory Visit: Payer: Self-pay | Admitting: *Deleted

## 2015-11-05 DIAGNOSIS — G8929 Other chronic pain: Secondary | ICD-10-CM

## 2015-11-05 DIAGNOSIS — M549 Dorsalgia, unspecified: Principal | ICD-10-CM

## 2015-11-05 MED ORDER — HYDROCODONE-ACETAMINOPHEN 5-325 MG PO TABS: 1.0000 | ORAL_TABLET | Freq: Four times a day (QID) | ORAL | 0 refills | Status: DC | PRN

## 2015-11-05 NOTE — Telephone Encounter (Signed)
Ariana [name], daughter requested and her husband will pick up

## 2015-11-10 ENCOUNTER — Other Ambulatory Visit: Payer: Self-pay | Admitting: Adult Health

## 2015-11-16 ENCOUNTER — Encounter: Payer: Self-pay | Admitting: Physician Assistant

## 2015-11-16 ENCOUNTER — Ambulatory Visit (INDEPENDENT_AMBULATORY_CARE_PROVIDER_SITE_OTHER): Payer: Medicare Other | Admitting: Physician Assistant

## 2015-11-16 ENCOUNTER — Encounter (INDEPENDENT_AMBULATORY_CARE_PROVIDER_SITE_OTHER): Payer: Self-pay

## 2015-11-16 VITALS — BP 118/70 | HR 88 | Ht 63.0 in | Wt 152.6 lb

## 2015-11-16 DIAGNOSIS — K5909 Other constipation: Secondary | ICD-10-CM | POA: Diagnosis not present

## 2015-11-16 DIAGNOSIS — K921 Melena: Secondary | ICD-10-CM | POA: Diagnosis not present

## 2015-11-16 MED ORDER — LINACLOTIDE 290 MCG PO CAPS: 290.0000 ug | ORAL_CAPSULE | Freq: Every day | ORAL | 3 refills | Status: DC

## 2015-11-16 MED ORDER — NA SULFATE-K SULFATE-MG SULF 17.5-3.13-1.6 GM/177ML PO SOLN: 1.0000 | ORAL | 0 refills | Status: DC

## 2015-11-16 NOTE — Progress Notes (Signed)
Chief Complaint: Constipation, Rectal Bleeding  HPI:  Ariana [name] is an 82 y/o Caucasian female, who was referred to me by Gayland Curry, DO for a complaint of constipation and rectal bleeding .  She regularly follows with Dr. Carlean Purl and was last seen in our clinic on 07/08/2014 for persistent constipation and abdominal pain. At that time, she was placed on Amitiza 24 mcg twice a day, bisacodyl when necessary and a TSH was drawn which was normal. It was recommended that if the patient continued with problems with constipation to change her to Linzess.  Per chart review patient's last colonoscopy was on 06/08/2008 for change in bowel habits towards constipation. At that time there was a finding of moderate diverticulosis in the sigmoid colon, melanosis coli in the right right colon and external hemorrhoids.  Patient recently had labs to include a CBC and TSH as well as CMP in May of this year. These were normal other than a potassium level low at 3.4 on 06/30/15.  Today, the patient presents to clinic accompanied by her daughter and caregiver. They explain that the patient has battled constipation for "years now". There has been no change to her chronic constipation recently. This has never been well controlled. Currently, the patient is using Amitiza 24 mcg twice a day, 2 Colace tablets and MiraLAX at night. She still does not have a bowel movement every day and in between has symptoms of bloating and abdominal distention as well as right-sided abdominal pain. Her caregiver and daughter explains that she has also had some bright red blood "off and on", this is typically found while wiping the patient after a hard to pass stool and "off and on in her depends". Most recently this occurred 2 weeks ago. They do bring photographic evidence with them. The patient describes that even when she does have a bowel movement it is typically just "hard balls or some loose stool mixed with a couple pieces of hard  stool". Per caregiver the patient does pass gas on a daily basis. She believes that she is eating less than normal, though she has gained weight. She is frustrated by the continuation of her symptoms.  Patient daughter describes that she has been in and out of the hospital multiple times over the past year, with a stroke, TIA, low potassium, low blood pressure and various other problems.  Patient denies fever, chills, weight loss, fatigue, shortness of breath, nausea, vomiting, heartburn or reflux.   Past Medical History:  Diagnosis Date  . Abnormality of gait   . Adenomatous polyp of colon 2002   40mm  . Allergic rhinitis   . Anxiety   . Anxiety and depression   . Chronic back pain   . Dementia without behavioral disturbance   . Depression   . Diverticulosis of colon   . Dry eye syndrome   . Dysphagia   . Dysthymic disorder   . Fibromyalgia   . GERD (gastroesophageal reflux disease)   . H/O hiatal hernia   . History of adverse drug reaction   . History of cerebrovascular disease 09/24/2014  . History of recurrent UTIs   . Hypertension, benign   . Irritable bowel syndrome   . Low back pain syndrome   . Memory loss   . Mitral valve prolapse   . Paroxysmal a-fib ([place])   . Peripheral neuropathy (HCC)    "both feet and legs"  . Physical deconditioning   . Sjogren's syndrome ([place])   . Therapeutic  opioid-induced constipation ([facility])   . Thyroid nodule   . Urinary incontinence     Past Surgical History:  Procedure Laterality Date  . ABDOMINAL HYSTERECTOMY  1967  . APPENDECTOMY    . CARDIAC CATHETERIZATION  02/17/2003   normal L main, LAD free of disease, Cfx free of disease, RCA free of disease (Dr. Loni Muse. [facility])  . CATARACT EXTRACTION, BILATERAL    . CHOLECYSTECTOMY  2000  . COLONOSCOPY W/ BIOPSIES     multiple  . DENTAL SURGERY     multiple tooth extractions  . ESOPHAGOGASTRODUODENOSCOPY (EGD) WITH ESOPHAGEAL DILATION N/A 08/23/2012   Procedure: ESOPHAGOGASTRODUODENOSCOPY  (EGD) WITH ESOPHAGEAL DILATION;  Surgeon: Milus Banister, MD;  Location: [facility];  Service: Endoscopy;  Laterality: N/A;  . MANDIBLE SURGERY    . NASAL SEPTUM SURGERY  1980  . NM MYOCAR PERF WALL MOTION  2003   persantine - normal static and dynamic study w/apical thinning and presvered LV function, no ischemia  . TEMPOROMANDIBULAR JOINT SURGERY  1986   Dr. Terence Lux  . TRANSTHORACIC ECHOCARDIOGRAM  2001   mild LVH, normal LV  . VESICOVAGINAL FISTULA CLOSURE W/ TAH      Current Outpatient Prescriptions  Medication Sig Dispense Refill  . ALPRAZolam (XANAX) 0.5 MG tablet TAKE ONE TABLET BY MOUTH TWICE DAILY AS NEEDED FOR ANXIETY 60 tablet 0  . aspirin 325 MG tablet Take 1 tablet (325 mg total) by mouth daily. 30 tablet 2  . bisacodyl (DULCOLAX) 5 MG EC tablet Take 10 mg by mouth daily as needed for moderate constipation.    . calcium carbonate (TUMS - DOSED IN MG ELEMENTAL CALCIUM) 500 MG chewable tablet Chew 1 tablet by mouth daily.    . carboxymethylcellulose (REFRESH TEARS) 0.5 % SOLN Place 1 drop into both eyes 2 (two) times daily as needed (dry eyes).     Marland Kitchen diltiazem (CARTIA XT) 180 MG 24 hr capsule Take 1 capsule (180 mg total) by mouth daily. 90 capsule 1  . Diphenhyd-Hydrocort-Nystatin (FIRST-DUKES MOUTHWASH) SUSP 1 tsp garlge and swallow four times daily 240 mL 6  . docusate sodium (COLACE) 100 MG capsule Take 1 capsule (100 mg total) by mouth daily. 10 capsule 0  . DULoxetine (CYMBALTA) 30 MG capsule Take 1 capsule (30 mg total) by mouth daily. For back pain and depression 30 capsule 3  . DULoxetine (CYMBALTA) 60 MG capsule Take one tablet by mouth in the evening once daily for depression 30 capsule 5  . fluticasone-salmeterol (ADVAIR HFA) 45-21 MCG/ACT inhaler Inhale 2 puffs into the lungs 2 (two) times daily.    Marland Kitchen HYDROcodone-acetaminophen (NORCO/VICODIN) 5-325 MG tablet Take 1 tablet by mouth every 6 (six) hours as needed for moderate pain. 120 tablet 0  .  hydroxychloroquine (PLAQUENIL) 200 MG tablet TAKE ONE TABLET BY MOUTH TWICE DAILY 60 tablet 1  . lubiprostone (AMITIZA) 24 MCG capsule Take 1 capsule (24 mcg total) by mouth 2 (two) times daily with a meal. 60 capsule 11  . memantine (NAMENDA) 10 MG tablet Take 1 tablet (10 mg total) by mouth 2 (two) times daily. 60 tablet 5  . MYRBETRIQ 25 MG TB24 tablet TAKE ONE TABLET BY MOUTH ONCE DAILY 30 tablet 1  . omeprazole (PRILOSEC) 20 MG capsule Take 20 mg by mouth daily.    . pilocarpine (SALAGEN) 5 MG tablet Take 5 mg by mouth 2 (two) times daily.    . pregabalin (LYRICA) 75 MG capsule Take 1 capsule (75 mg total) by mouth 2 (two) times  daily. For pain 60 capsule 3  . senna (SENOKOT) 8.6 MG TABS tablet Take 2 tablets (17.2 mg total) by mouth daily. 120 each 0  . sodium chloride (OCEAN) 0.65 % SOLN nasal spray Place 1 spray into the nose as needed for congestion. 30 mL 0   No current facility-administered medications for this visit.     Allergies as of 11/16/2015 - Review Complete 10/06/2015  Allergen Reaction Noted  . Banana Nausea And Vomiting 07/26/2014  . Codeine Nausea Only   . Klonopin [clonazepam] Other (See Comments) 07/14/2011  . Meperidine hcl Nausea Only   . Norflex [orphenadrine citrate] Nausea Only 04/08/2011  . Oxycodone-acetaminophen Nausea Only   . Propoxyphene hcl Nausea Only   . Zoloft [sertraline hcl] Other (See Comments) 07/14/2011  . Doxycycline Other (See Comments)   . Naproxen Other (See Comments)   . Penicillins Other (See Comments)   . Phenothiazines Other (See Comments) 04/08/2011  . Stelazine Other (See Comments) 04/08/2011  . Sulfamethoxazole-trimethoprim Other (See Comments)   . Tolectin [tolmetin sodium] Other (See Comments) 04/08/2011  . Tramadol Other (See Comments)     Family History  Problem Relation Age of Onset  . Heart disease Father     heart attack  . Pneumonia Mother   . Heart attack Mother   . Hypertension Mother   . Colon cancer Sister     . Kidney disease Daughter   . Asthma Daughter   . Arthritis Daughter 51    osteo,  . Heart disease Son 76    stage 3 CHF(Diastolic /Systolic)  . Throat cancer Brother   . Hypertension Maternal Grandmother     Social History   Social History  . Marital status: Widowed    Spouse name: N/A  . Number of children: 2  . Years of education: 12   Occupational History  . Retired    Social History Main Topics  . Smoking status: Former Research scientist (life sciences)  . Smokeless tobacco: Never Used     Comment: Quit at age 79   . Alcohol use No  . Drug use: No  . Sexual activity: No   Other Topics Concern  . 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    Patient is Widowed.    Patient has 2 children.    Patient is retired.    Former smoker   Alcohol none   Exercise Walk, exercise chair 4 days a week   POA    Walks with cane      Patient drinks about 1-2 cups of hot tea daily.   Patient is right handed.                   Review of Systems:    Constitutional: No weight loss, fever, chills, weakness or fatigue HEENT: Eyes: No change in vision               Ears, Nose, Throat:  No change in hearing or congestion Skin: No rash or itching Cardiovascular: No chest pain, chest pressure or palpitations   Respiratory: No SOB or cough Gastrointestinal: See HPI and otherwise negative Genitourinary: No dysuria or change in urinary frequency Neurological: No headache, dizziness or syncope Musculoskeletal: No new muscle or joint pain Hematologic: No bleeding or bruising Psychiatric: No history of depression or anxiety    Physical Exam:  Vital signs: BP 118/70   Pulse 88   Ht 5\' 3"  (  1.6 m)   Wt 152 lb 9.6 oz (69.2 kg)   BMI 27.03 kg/m   General:   Pleasant elderly Caucasian female appears to be in NAD, Well developed, Well nourished, alert and cooperative Head:  Normocephalic and atraumatic. Eyes:   PEERL, EOMI. No icterus. Conjunctiva  pink. Ears:  Normal auditory acuity. Neck:  Supple Throat: Oral cavity and pharynx without inflammation, swelling or lesion.  Lungs: Respirations even and unlabored. Lungs clear to auscultation bilaterally.   No wheezes, crackles, or rhonchi.  Heart: Normal S1, S2. No MRG. Regular rate and rhythm. No peripheral edema, cyanosis or pallor.  Abdomen:  Soft, moderate distention, nontender. No rebound or guarding. Decreased bowel sounds. No appreciable masses or hepatomegaly. Rectal:  Not performed.  Msk:  Symmetrical without gross deformities.  Extremities:  Without edema, no deformity or joint abnormality.  Neurologic:  Alert and  oriented x4;  grossly normal neurologically.  Skin:   Dry and intact without significant lesions or rashes. Psychiatric: Oriented to person, place and time. Demonstrates good judgement and reason without abnormal affect or behaviors.  MOST RECENT LABS: CBC    Component Value Date/Time   WBC 6.5 07/01/2015 0529   RBC 4.14 07/01/2015 0529   HGB 12.5 07/01/2015 0529   HCT 37.5 07/01/2015 0529   HCT 35.2 12/10/2014 1613   PLT 181 07/01/2015 0529   PLT 241 12/10/2014 1613   MCV 90.6 07/01/2015 0529   MCV 88 12/10/2014 1613   MCH 30.2 07/01/2015 0529   MCHC 33.3 07/01/2015 0529   RDW 13.6 07/01/2015 0529   RDW 13.5 12/10/2014 1613   LYMPHSABS 2.0 07/01/2015 0529   LYMPHSABS 1.5 12/10/2014 1613   MONOABS 0.7 07/01/2015 0529   EOSABS 0.2 07/01/2015 0529   EOSABS 0.2 12/10/2014 1613   BASOSABS 0.0 07/01/2015 0529   BASOSABS 0.0 12/10/2014 1613    CMP     Component Value Date/Time   NA 139 07/08/2015 1241   K 4.6 07/08/2015 1241   CL 96 07/08/2015 1241   CO2 28 07/08/2015 1241   GLUCOSE 94 07/08/2015 1241   GLUCOSE 120 (H) 07/01/2015 0529   BUN 22 07/08/2015 1241   CREATININE 0.73 07/08/2015 1241   CREATININE 0.82 07/30/2013 1400   CALCIUM 10.2 07/08/2015 1241   PROT 6.8 07/01/2015 0529   PROT 6.8 09/04/2014 1226   ALBUMIN 4.0 07/01/2015 0529    ALBUMIN 4.1 09/04/2014 1226   AST 24 07/01/2015 0529   ALT 19 07/01/2015 0529   ALKPHOS 83 07/01/2015 0529   BILITOT 0.4 07/01/2015 0529   BILITOT 0.4 09/04/2014 1226   GFRNONAA 76 07/08/2015 1241   GFRAA 87 07/08/2015 1241    Assessment: 1. Chronic Constipation:Per chart review this has been going on for over 7 years, the patient is currently uncontrolled on 2 tabs of Colace a day, one dose of MiraLAX and twice a day dosing of Amitiza 25mcg, last colonoscopy for similar symptoms in 2010 with no etiology for symptoms, it was thought in the past that this is related to patient's pain medications 2. Hematochezia: Intermittent episodes of bright red blood, typically on the toilet paper or in the patient's depends, worse during severe times of constipation, last episode 2 weeks ago, likely this is related to hemorrhoids, though if patient has more episodes/change in symptoms would recommend a colonoscopy for further evaluation  Plan: 1. At this time discussed the risks, benefits, limitations and alternatives to colonoscopy. The patient does wish to proceed with this if necessary. Currently,  recommend we try a change in the patient's laxative regimen before proceeding as she is at increased risk due to age and comorbidities. Family agrees. 2. Prescribed Linzess 290 mcg daily 30 minutes before eating. Patient should discontinue Amitiza once this medication was started. 3. Did express to the patient that she should/may need to continue MiraLAX. She may use this up to 4 times a day. 4. Patient will start with a suprep regimen, then proceed as above. 5. Instructed the patient and her caregivers/daughter to call our office and let us know how she is doing after [employer]. Recommend she remain on a clear liquid diet for the day. 6. If no change in symptoms over the next month would recommend proceeding with a colonoscopy for further evaluation. This would be scheduled Dr. Carlean Purl. 7. Patient to  return to clinic in 4-6 weeks or sooner if necessary.  Ellouise Newer, PA-C [facility] 11/16/2015, 1:28 PM  Cc: Gayland Curry, DO

## 2015-11-16 NOTE — Patient Instructions (Signed)
We have given you instructions on how to use a suprep. Please pick up your supplies from your pharmacy.   We have sent the following medications to your pharmacy for you to pick up at your convenience: Linzess 290 mg daily  Ariana [name]. You can continue Miralax up to four times a day.

## 2015-11-17 ENCOUNTER — Telehealth: Payer: Self-pay | Admitting: Internal Medicine

## 2015-11-17 NOTE — Progress Notes (Signed)
Agree w/ Ms. Lemmon's management 

## 2015-11-17 NOTE — Telephone Encounter (Signed)
Called [facility] and answered their question.  Ariana [name], CMA started a prior authorization recently for the [place].  They will contact us with their decision.

## 2015-11-19 ENCOUNTER — Telehealth: Payer: Self-pay

## 2015-11-19 NOTE — Telephone Encounter (Signed)
Got fax from [facility] that linzess approve thru 11/16/2016 and [facility] informed.  Perfect timing, patient was in the drive thru lane.

## 2015-11-24 ENCOUNTER — Other Ambulatory Visit: Payer: Self-pay | Admitting: *Deleted

## 2015-11-24 MED ORDER — ALPRAZOLAM 0.5 MG PO TABS: 0.5000 mg | ORAL_TABLET | Freq: Two times a day (BID) | ORAL | 0 refills | Status: DC | PRN

## 2015-11-24 NOTE — Telephone Encounter (Signed)
Patient daughter, Kayleeann requested. Phoned to pharmacy.

## 2015-12-13 ENCOUNTER — Encounter: Payer: Self-pay | Admitting: Internal Medicine

## 2015-12-13 ENCOUNTER — Ambulatory Visit (INDEPENDENT_AMBULATORY_CARE_PROVIDER_SITE_OTHER): Payer: Medicare Other | Admitting: Internal Medicine

## 2015-12-13 VITALS — BP 120/80 | HR 69 | Temp 97.7°F | Wt 149.0 lb

## 2015-12-13 DIAGNOSIS — W19XXXA Unspecified fall, initial encounter: Secondary | ICD-10-CM | POA: Diagnosis not present

## 2015-12-13 DIAGNOSIS — Z23 Encounter for immunization: Secondary | ICD-10-CM | POA: Diagnosis not present

## 2015-12-13 DIAGNOSIS — K2100 Gastro-esophageal reflux disease with esophagitis, without bleeding: Secondary | ICD-10-CM

## 2015-12-13 DIAGNOSIS — K581 Irritable bowel syndrome with constipation: Secondary | ICD-10-CM

## 2015-12-13 DIAGNOSIS — M25561 Pain in right knee: Secondary | ICD-10-CM | POA: Diagnosis not present

## 2015-12-13 DIAGNOSIS — F0150 Vascular dementia without behavioral disturbance: Secondary | ICD-10-CM | POA: Diagnosis not present

## 2015-12-13 DIAGNOSIS — M6281 Muscle weakness (generalized): Secondary | ICD-10-CM | POA: Diagnosis not present

## 2015-12-13 DIAGNOSIS — M545 Low back pain, unspecified: Secondary | ICD-10-CM

## 2015-12-13 DIAGNOSIS — E041 Nontoxic single thyroid nodule: Secondary | ICD-10-CM | POA: Diagnosis not present

## 2015-12-13 DIAGNOSIS — E7800 Pure hypercholesterolemia, unspecified: Secondary | ICD-10-CM | POA: Diagnosis not present

## 2015-12-13 DIAGNOSIS — R739 Hyperglycemia, unspecified: Secondary | ICD-10-CM | POA: Diagnosis not present

## 2015-12-13 LAB — COMPLETE METABOLIC PANEL WITH GFR
ALT: 17 U/L (ref 6–29)
AST: 22 U/L (ref 10–35)
Albumin: 4.3 g/dL (ref 3.6–5.1)
Alkaline Phosphatase: 82 U/L (ref 33–130)
BUN: 12 mg/dL (ref 7–25)
CO2: 28 mmol/L (ref 20–31)
Calcium: 10 mg/dL (ref 8.6–10.4)
Chloride: 102 mmol/L (ref 98–110)
Creat: 0.7 mg/dL (ref 0.60–0.88)
GFR, Est African American: >89 mL/min (ref >=60)
GFR, Est Non African American: 80 mL/min (ref >=60)
Glucose, Bld: 115 mg/dL — ABNORMAL HIGH (ref 65–99)
Potassium: 4.4 mmol/L (ref 3.5–5.3)
Sodium: 140 mmol/L (ref 135–146)
Total Bilirubin: 0.5 mg/dL (ref 0.2–1.2)
Total Protein: 7.3 g/dL (ref 6.1–8.1)

## 2015-12-13 LAB — CBC WITH DIFFERENTIAL/PLATELET
Basophils Absolute: 0 cells/uL (ref 0–200)
Basophils Relative: 0 %
Eosinophils Absolute: 135 cells/uL (ref 15–500)
Eosinophils Relative: 3 %
HCT: 40.6 % (ref 35.0–45.0)
Hemoglobin: 13.8 g/dL (ref 11.7–15.5)
Lymphocytes Relative: 30 %
Lymphs Abs: 1350 cells/uL (ref 850–3900)
MCH: 30.7 pg (ref 27.0–33.0)
MCHC: 34 g/dL (ref 32.0–36.0)
MCV: 90.4 fL (ref 80.0–100.0)
MPV: 10.7 fL (ref 7.5–12.5)
Monocytes Absolute: 540 cells/uL (ref 200–950)
Monocytes Relative: 12 %
Neutro Abs: 2475 cells/uL (ref 1500–7800)
Neutrophils Relative %: 55 %
Platelets: 229 10*3/uL (ref 140–400)
RBC: 4.49 MIL/uL (ref 3.80–5.10)
RDW: 13.8 % (ref 11.0–15.0)
WBC: 4.5 10*3/uL (ref 3.8–10.8)

## 2015-12-13 LAB — LIPID PANEL
Cholesterol: 189 mg/dL (ref 125–200)
HDL: 53 mg/dL (ref >=46)
LDL Cholesterol: 113 mg/dL (ref <130)
Total CHOL/HDL Ratio: 3.6 Ratio (ref <=5.0)
Triglycerides: 116 mg/dL (ref <150)
VLDL: 23 mg/dL (ref <30)

## 2015-12-13 LAB — TSH: TSH: 1.83 mIU/L

## 2015-12-13 MED ORDER — LANSOPRAZOLE 30 MG PO CPDR: 30.0000 mg | DELAYED_RELEASE_CAPSULE | Freq: Every day | ORAL | 3 refills | Status: DC

## 2015-12-13 NOTE — Progress Notes (Signed)
Location:  [facility] clinic Provider:  Brittinie Wherley L. Mariea Clonts, D.O., C.M.D.  Code Status: DNR Goals of Care:  Advanced Directives 09/06/2015  Does patient have an advance directive? Yes  Type of Advance Directive Healthcare Power of Attorney  Does patient want to make changes to advanced directive? -  Copy of advanced directive(s) in chart? -  Would patient like information on creating an advanced directive? -  Pre-existing out of facility DNR order (yellow form or pink MOST form) -     Chief Complaint  Patient presents with  . Medical Management of Chronic Issues    3 mth follow-up, pt fell     HPI: Patient is a 82 y.o. female seen today for medical management of chronic diseases.    Hooray!  No hospitalizations or ED visits since last appt.  Still has the same caregiver at home.   She has been to GI on 11/16/15 and placed on linoclotide 217mg (linzess). For her IBS with constipation.  She also was advised that she may continue miralax up to 4x daily.  She was started on the suprep regimen also before starting the linzess and miralax routine.  If symptoms are not improving, she should f/u with Dr. GCarlean Purlwithin 4-6 wks.  She started the prep on 10/12 and had a full week of BMs--some hard, some soft.  Was slow starting, but then going regularly every other day.  She still feels bloated.  Talks about how the right side is pooched out and tender at times.      She was up during the night and had a fall when no one is in the home.  She hit her right cheek when she got up from the commode and she was pulling up her pants, her foot slide and she did not have grippy socks on.  She slid on the linoleum onto the carpet and that's where she scraped her right knee and right cheek.  She came in wheelchair due to pain in the knee.  The right knee hurts her sometimes anyway.  Shoots up when she dorsiflexes her foot.    She has severe proximal muscle weakness--hard to get up off soft surfaces like sofa.     She had white sputum production last week, but none noted over weekend or today.  Not coughing.  She gets her usual dry mouth, dry nose.    Past Medical History:  Diagnosis Date  . Abnormality of gait   . Adenomatous polyp of colon 2002   727m . Allergic rhinitis   . Anxiety   . Anxiety and depression   . Chronic back pain   . Dementia without behavioral disturbance   . Depression   . Diverticulosis of colon   . Dry eye syndrome   . Dysphagia   . Dysthymic disorder   . Fibromyalgia   . GERD (gastroesophageal reflux disease)   . H/O hiatal hernia   . History of adverse drug reaction   . History of cerebrovascular disease 09/24/2014  . History of recurrent UTIs   . Hypertension, benign   . Irritable bowel syndrome   . Low back pain syndrome   . Memory loss   . Mitral valve prolapse   . Paroxysmal a-fib ([place]  . Peripheral neuropathy (HCC)    "both feet and legs"  . Physical deconditioning   . Sjogren's syndrome ([facility] [place]  . Therapeutic opioid-induced constipation (OIC)   . Thyroid nodule   . Urinary incontinence  Past Surgical History:  Procedure Laterality Date  . ABDOMINAL HYSTERECTOMY  1967  . APPENDECTOMY    . CARDIAC CATHETERIZATION  02/17/2003   normal L main, LAD free of disease, Cfx free of disease, RCA free of disease (Dr. Loni Muse. Little)  . CATARACT EXTRACTION, BILATERAL    . CHOLECYSTECTOMY  2000  . COLONOSCOPY W/ BIOPSIES     multiple  . DENTAL SURGERY     multiple tooth extractions  . ESOPHAGOGASTRODUODENOSCOPY (EGD) WITH ESOPHAGEAL DILATION N/A 08/23/2012   Procedure: ESOPHAGOGASTRODUODENOSCOPY (EGD) WITH ESOPHAGEAL DILATION;  Surgeon: Milus Banister, MD;  Location: [facility];  Service: Endoscopy;  Laterality: N/A;  . NASAL SEPTUM SURGERY  1980  . NM MYOCAR PERF WALL MOTION  2003   persantine - normal static and dynamic study w/apical thinning and presvered LV function, no ischemia  . SINUS EXPLORATION     ossifiying fibroma  . TEMPOROMANDIBULAR  JOINT SURGERY  1986   Dr. Terence Lux  . TRANSTHORACIC ECHOCARDIOGRAM  2001   mild LVH, normal LV    Allergies  Allergen Reactions  . Banana Nausea And Vomiting  . Codeine Nausea Only    unless given with Phenergan  . Klonopin [Clonazepam] Other (See Comments)    Causes hallucination   . Meperidine Hcl Nausea Only    unless given with Phenergan  . Norflex [Orphenadrine Citrate] Nausea Only    Unless given with Phenergan  . Oxycodone-Acetaminophen Nausea Only    unless given with phenergan  . Propoxyphene Hcl Nausea Only    unless given with phenergan  . Zoloft [Sertraline Hcl] Other (See Comments)    Caused lethargy  . Doxycycline Other (See Comments)    Unknown  . Naproxen Other (See Comments)  . Penicillins Other (See Comments)    Unknown  . Phenothiazines Other (See Comments)    Unknown  . Stelazine Other (See Comments)    Unknown  . Sulfamethoxazole-Trimethoprim Other (See Comments)    Unknown  . Tolectin [Tolmetin Sodium] Other (See Comments)    Unknown  . Tramadol Other (See Comments)    Unknown      Medication List       Accurate as of 12/13/15 10:21 AM. Always use your most recent med list.          ALPRAZolam 0.5 MG tablet Commonly known as:  XANAX Take 1 tablet (0.5 mg total) by mouth 2 (two) times daily as needed. for anxiety   aspirin 325 MG tablet Take 1 tablet (325 mg total) by mouth daily.   bisacodyl 5 MG EC tablet Commonly known as:  DULCOLAX Take 10 mg by mouth daily as needed for moderate constipation.   calcium carbonate 500 MG chewable tablet Commonly known as:  TUMS - dosed in mg elemental calcium Chew 1 tablet by mouth daily.   diltiazem 180 MG 24 hr capsule Commonly known as:  CARTIA XT Take 1 capsule (180 mg total) by mouth daily.   docusate sodium 100 MG capsule Commonly known as:  COLACE Take 1 capsule (100 mg total) by mouth daily.   DULoxetine 60 MG capsule Commonly known as:  CYMBALTA Take one tablet by mouth in  the evening once daily for depression   DULoxetine 30 MG capsule Commonly known as:  CYMBALTA Take 1 capsule (30 mg total) by mouth daily. For back pain and depression   FIRST-DUKES MOUTHWASH Susp 1 tsp garlge and swallow four times daily   HYDROcodone-acetaminophen 5-325 MG tablet Commonly known as:  NORCO/VICODIN Take 1  tablet by mouth every 6 (six) hours as needed for moderate pain.   hydroxychloroquine 200 MG tablet Commonly known as:  PLAQUENIL TAKE ONE TABLET BY MOUTH TWICE DAILY   linaclotide 290 MCG Caps capsule Commonly known as:  LINZESS Take 1 capsule (290 mcg total) by mouth daily before breakfast.   memantine 10 MG tablet Commonly known as:  NAMENDA Take 1 tablet (10 mg total) by mouth 2 (two) times daily.   MYRBETRIQ 25 MG Tb24 tablet Generic drug:  mirabegron ER TAKE ONE TABLET BY MOUTH ONCE DAILY   Na Sulfate-K Sulfate-Mg Sulf 17.5-3.13-1.6 GM/180ML Soln Take 1 kit by mouth as directed.   pilocarpine 5 MG tablet Commonly known as:  SALAGEN Take 5 mg by mouth 2 (two) times daily.   pregabalin 75 MG capsule Commonly known as:  LYRICA Take 1 capsule (75 mg total) by mouth 2 (two) times daily. For pain   REFRESH TEARS 0.5 % Soln Generic drug:  carboxymethylcellulose Place 1 drop into both eyes 2 (two) times daily as needed (dry eyes).   senna 8.6 MG Tabs tablet Commonly known as:  SENOKOT Take 2 tablets (17.2 mg total) by mouth daily.   sodium chloride 0.65 % Soln nasal spray Commonly known as:  OCEAN Place 1 spray into the nose as needed for congestion.       Review of Systems:  Review of Systems  Constitutional: Positive for weight loss. Negative for chills and fever.       Is down 4 lbs.    HENT: Positive for hearing loss.   Eyes: Negative for blurred vision.  Respiratory: Negative for cough and shortness of breath.   Cardiovascular: Negative for chest pain, palpitations and leg swelling.  Gastrointestinal: Positive for abdominal pain and  constipation. Negative for blood in stool, diarrhea and melena.  Genitourinary: Negative for dysuria.  Musculoskeletal: Positive for falls and joint pain.  Skin: Negative for itching and rash.  Neurological: Positive for weakness. Negative for dizziness and loss of consciousness.  Endo/Heme/Allergies: Bruises/bleeds easily.  Psychiatric/Behavioral: Positive for memory loss. Negative for depression and hallucinations. The patient is not nervous/anxious and does not have insomnia.     Health Maintenance  Topic Date Due  . DEXA SCAN  07/04/1996  . INFLUENZA VACCINE  09/14/2015  . MAMMOGRAM  07/26/2016  . TETANUS/TDAP  12/09/2023  . ZOSTAVAX  Completed  . PNA vac Low Risk Adult  Completed    Physical Exam: Vitals:   12/13/15 1016  BP: 120/80  Pulse: 69  Temp: 97.7 F (36.5 C)  TempSrc: Oral  SpO2: 97%  Weight: 149 lb (67.6 kg)   Body mass index is 26.39 kg/m. Physical Exam  Constitutional: She appears well-developed and well-nourished. No distress.  Cardiovascular: Intact distal pulses.   irreg irreg  Pulmonary/Chest: Effort normal and breath sounds normal. No respiratory distress.  Abdominal: Soft. Bowel sounds are normal. She exhibits distension. She exhibits no mass. There is tenderness. There is no rebound and no guarding. No hernia.  Musculoskeletal: Normal range of motion. She exhibits tenderness.  Right lateral knee, but able to flex it  Neurological: She is alert.  Oriented to person, place, not time; tells stories from the distant past--usually with a negative twist  Skin: Skin is warm and dry. There is pallor.  Abrasion right cheek  Psychiatric:  Spirits seem better the last few visits with daytime caregiver    Labs reviewed: Basic Metabolic Panel:  Recent Labs  04/19/15 04/19/15 0349  06/29/15 1600 06/30/15  1610 06/30/15 1214 07/01/15 0529 07/08/15 1241  NA  --   --   < >  --  142  --  143 139  K  --   --   < >  --  4.0  --  3.4* 4.6  CL  --   --   <  >  --  109  --  107 96  CO2  --   --   < >  --  28  --  31 28  GLUCOSE  --   --   < >  --  100*  --  120* 94  BUN  --   --   < >  --  21*  --  15 22  CREATININE  --   --   < >  --  0.65  --  0.71 0.73  CALCIUM  --   --   < >  --  9.0  --  9.6 10.2  MG  --   --   --  2.0  --   --   --   --   PHOS  --   --   --  3.5  --   --   --   --   TSH 1.68 1.680  --   --   --  0.889  --   --   < > = values in this interval not displayed. Liver Function Tests:  Recent Labs  06/29/15 1556 06/30/15 0325 07/01/15 0529  AST '23 23 24  ' ALT '23 19 19  ' ALKPHOS 94 76 83  BILITOT 0.5 0.4 0.4  PROT 7.3 6.2* 6.8  ALBUMIN 4.4 3.6 4.0   No results for input(s): LIPASE, AMYLASE in the last 8760 hours. No results for input(s): AMMONIA in the last 8760 hours. CBC:  Recent Labs  06/29/15 1556 06/30/15 0325 07/01/15 0529  WBC 6.4 5.2 6.5  NEUTROABS 3.1 2.4 3.6  HGB 13.4 11.7* 12.5  HCT 40.3 35.4* 37.5  MCV 89.4 91.2 90.6  PLT 203 170 181   Lipid Panel:  Recent Labs  12/17/14 0215  CHOL 121  HDL 45  LDLCALC 60  TRIG 78  CHOLHDL 2.7   Lab Results  Component Value Date   HGBA1C 6.4 (H) 04/19/2015    Assessment/Plan 1. Irritable bowel syndrome with constipation -cont high dose linzess per GI and reminded that miralax can also be used if she is still have the right sided abdominal pain and not having enough bms (is now going every other day   2. Fall, initial encounter -fell overnight when using the restroom; unfortunately, she cannot afford care overnight and in the daytime  -she also was not wearing the proper non-skid footwear which was reviewed with her and her caregiver  3. Acute pain of right knee -increased due to her fall overnight last night, but does have some chronic pain in the knee as well -advised to use ice, elevate, and apply aspercreme  4. Vascular dementia without behavioral disturbance -no recent changes, seems pretty stable from this perspective, chronically repeats  her stories from the past usually in a negative light  5. Proximal muscle weakness -worse lately, will see if she can get more home PT for strengthening; really needs long term care, but it's not a financially viable option and her daughter would need guardianship first since pt refuses placement  6. Low back pain associated with a spinal disorder other than radiculopathy or spinal stenosis -chronic, stable, cont  same regimen  -does have constipation from opioids, unfortunately, does not get enough benefit from nonopioid pain medications  7. Gastroesophageal reflux disease with esophagitis - med changed again during a hospitalization and now on prevacid - lansoprazole (PREVACID) 30 MG capsule; Take 1 capsule (30 mg total) by mouth daily before breakfast.  Dispense: 90 capsule; Refill: 3  Labs/tests ordered:   Orders Placed This Encounter  Procedures  . Flu Vaccine QUAD 36+ mos PF IM (Fluarix & Fluzone Quad PF)  . TSH  . CBC with Differential/Platelet  . COMPLETE METABOLIC PANEL WITH GFR    [facility]  . Hemoglobin A1c  . Lipid panel    Order Specific Question:   Has the patient fasted?    Answer:   Yes  . Ambulatory referral to Home Health    Referral Priority:   Routine    Referral Type:   Home Health Care    Referral Reason:   Specialty Services Required    Requested Specialty:   [place]    Number of Visits Requested:   1    Next appt:  04/10/2016 AWV/CPE   Eria Lozoya L. Alizae Bechtel, D.O. [facility] [address], [place] [address] Cell Phone (Mon-Fri 8am-5pm):  [phone] On Call:  [phone] & follow prompts after 5pm & weekends Office Phone:  [phone] Office Fax:  [phone]

## 2015-12-13 NOTE — Patient Instructions (Addendum)
Elevate right foot and put ice on right knee 3 times today for 20 minutes each time.  You may also use aspercreme over the counter on it.    Continue with the linzess per Dr. Carlean Purl and add back more miralax if BM frequency slows down.  It looks like this is working to me though.  Every other day BMs are great compared to what she used to have.

## 2015-12-14 LAB — HEMOGLOBIN A1C
Hgb A1c MFr Bld: 6 % — ABNORMAL HIGH (ref <5.7)
Mean Plasma Glucose: 126 mg/dL

## 2015-12-15 DIAGNOSIS — G8929 Other chronic pain: Secondary | ICD-10-CM | POA: Diagnosis not present

## 2015-12-15 DIAGNOSIS — I10 Essential (primary) hypertension: Secondary | ICD-10-CM | POA: Diagnosis not present

## 2015-12-15 DIAGNOSIS — M545 Low back pain: Secondary | ICD-10-CM | POA: Diagnosis not present

## 2015-12-15 DIAGNOSIS — M797 Fibromyalgia: Secondary | ICD-10-CM | POA: Diagnosis not present

## 2015-12-15 DIAGNOSIS — K581 Irritable bowel syndrome with constipation: Secondary | ICD-10-CM | POA: Diagnosis not present

## 2015-12-15 DIAGNOSIS — F0150 Vascular dementia without behavioral disturbance: Secondary | ICD-10-CM | POA: Diagnosis not present

## 2015-12-15 DIAGNOSIS — M25561 Pain in right knee: Secondary | ICD-10-CM | POA: Diagnosis not present

## 2015-12-15 DIAGNOSIS — M3500 Sicca syndrome, unspecified: Secondary | ICD-10-CM | POA: Diagnosis not present

## 2015-12-15 DIAGNOSIS — I480 Paroxysmal atrial fibrillation: Secondary | ICD-10-CM | POA: Diagnosis not present

## 2015-12-15 DIAGNOSIS — G629 Polyneuropathy, unspecified: Secondary | ICD-10-CM | POA: Diagnosis not present

## 2015-12-16 ENCOUNTER — Encounter: Payer: Self-pay | Admitting: *Deleted

## 2015-12-17 ENCOUNTER — Other Ambulatory Visit: Payer: Self-pay | Admitting: *Deleted

## 2015-12-20 ENCOUNTER — Other Ambulatory Visit: Payer: Self-pay | Admitting: *Deleted

## 2015-12-20 MED ORDER — PREGABALIN 75 MG PO CAPS: 75.0000 mg | ORAL_CAPSULE | Freq: Two times a day (BID) | ORAL | 1 refills | Status: DC

## 2015-12-20 NOTE — Telephone Encounter (Signed)
[facility] 

## 2015-12-24 ENCOUNTER — Other Ambulatory Visit: Payer: Self-pay | Admitting: *Deleted

## 2015-12-24 ENCOUNTER — Other Ambulatory Visit: Payer: Self-pay | Admitting: Internal Medicine

## 2015-12-24 DIAGNOSIS — M545 Low back pain: Secondary | ICD-10-CM | POA: Diagnosis not present

## 2015-12-24 DIAGNOSIS — G8929 Other chronic pain: Secondary | ICD-10-CM | POA: Diagnosis not present

## 2015-12-24 DIAGNOSIS — G629 Polyneuropathy, unspecified: Secondary | ICD-10-CM | POA: Diagnosis not present

## 2015-12-24 DIAGNOSIS — M25561 Pain in right knee: Secondary | ICD-10-CM | POA: Diagnosis not present

## 2015-12-24 MED ORDER — ALPRAZOLAM 0.5 MG PO TABS: 0.5000 mg | ORAL_TABLET | Freq: Two times a day (BID) | ORAL | 0 refills | Status: DC | PRN

## 2015-12-24 NOTE — Telephone Encounter (Signed)
Patient daughter, Dorie Requested. Phoned to pharmacy.

## 2015-12-30 ENCOUNTER — Other Ambulatory Visit: Payer: Self-pay | Admitting: *Deleted

## 2015-12-30 MED ORDER — HYDROCODONE-ACETAMINOPHEN 5-325 MG PO TABS: 1.0000 | ORAL_TABLET | Freq: Four times a day (QID) | ORAL | 0 refills | Status: DC | PRN

## 2015-12-30 NOTE — Telephone Encounter (Signed)
Patient daughter, Ltonya requested and will pick up

## 2016-01-10 ENCOUNTER — Other Ambulatory Visit: Payer: Self-pay | Admitting: Internal Medicine

## 2016-01-10 DIAGNOSIS — R682 Dry mouth, unspecified: Secondary | ICD-10-CM

## 2016-01-11 ENCOUNTER — Other Ambulatory Visit: Payer: Self-pay | Admitting: Internal Medicine

## 2016-01-11 ENCOUNTER — Other Ambulatory Visit: Payer: Self-pay | Admitting: *Deleted

## 2016-01-11 DIAGNOSIS — G8929 Other chronic pain: Secondary | ICD-10-CM

## 2016-01-11 DIAGNOSIS — F341 Dysthymic disorder: Secondary | ICD-10-CM

## 2016-01-11 DIAGNOSIS — M549 Dorsalgia, unspecified: Secondary | ICD-10-CM

## 2016-01-11 MED ORDER — DULOXETINE HCL 30 MG PO CPEP: ORAL_CAPSULE | ORAL | 1 refills | Status: DC

## 2016-01-11 MED ORDER — DULOXETINE HCL 60 MG PO CPEP: ORAL_CAPSULE | ORAL | 1 refills | Status: DC

## 2016-01-11 MED ORDER — MIRABEGRON ER 25 MG PO TB24: 25.0000 mg | ORAL_TABLET | Freq: Every day | ORAL | 1 refills | Status: DC

## 2016-01-11 NOTE — Telephone Encounter (Signed)
Patient daughter, Valory and Pharmacy requested 90 day supply.

## 2016-01-20 ENCOUNTER — Encounter: Payer: Self-pay | Admitting: Internal Medicine

## 2016-01-20 ENCOUNTER — Ambulatory Visit (INDEPENDENT_AMBULATORY_CARE_PROVIDER_SITE_OTHER): Payer: Medicare Other | Admitting: Internal Medicine

## 2016-01-20 VITALS — BP 106/64 | HR 80 | Ht 63.0 in | Wt 151.4 lb

## 2016-01-20 DIAGNOSIS — K581 Irritable bowel syndrome with constipation: Secondary | ICD-10-CM

## 2016-01-20 DIAGNOSIS — K649 Unspecified hemorrhoids: Secondary | ICD-10-CM | POA: Diagnosis not present

## 2016-01-20 DIAGNOSIS — K5909 Other constipation: Secondary | ICD-10-CM | POA: Diagnosis not present

## 2016-01-20 NOTE — Patient Instructions (Signed)
   Please use 1-2 Dulcolax on days you don't have a bowel movement to help you move your bowels.    I appreciate the opportunity to care for you. Silvano Rusk, MD, [facility]

## 2016-01-20 NOTE — Progress Notes (Signed)
   Ariana [name] 82 y.o. 1929-10-16 [id number]  Assessment & Plan:   Encounter Diagnoses  Name Primary?  . Chronic constipation Yes  . Irritable bowel syndrome with constipation   . Bleeding hemorrhoids    Will have her use bisacodyl on days w/o defecation  She has lax abdominal wall muscles and nothing to do for this and bloating sensation beyond what we are doing. I reviewed 04/2015 CT abd/pelvis images in person and showed patient  showing the mild assymetry of abdomen and lax abd muscles.   Hemorrhoids better  See me prn - conservative approach warranted here  I appreciate the opportunity to care for this patient. [id number], TIFFANY, DO  25 minutes time spent with patient > half in counseling coordination of care   Subjective:   Chief Complaint: bloated, constipation  HPI Here w/ caregiver - f/u after seeing Ellouise Newer PA-C - 10/3 - Linzess Rx to start after bowel purge w/ SuPrep. Hx a bit difficult to obtain but sounds like bowels do move better on Linzess but not all days. Not using bisacodyl prn but does take Colacve. Less rectal bleeding and only on paper at times. Most upset about abdominal bloating and protuberant abdomen R> L.  Medications, allergies, past medical history, past surgical history, family history and social history are reviewed and updated in the EMR.  Review of Systems Wt Readings from Last 3 Encounters:  01/20/16 151 lb 6 oz (68.7 kg)  12/13/15 149 lb (67.6 kg)  11/16/15 152 lb 9.6 oz (69.2 kg)     Objective:   Physical Exam BP 106/64   Pulse 80   Ht 5\' 3"  (1.6 m)   Wt 151 lb 6 oz (68.7 kg)   BMI 26.81 kg/m  NAD Chronically ill Eyes are anicteric Abdomen - protuberant and assymmetric bulge R> L - nontender  Precious Bard [place], CMA present. Rectal - small - med fleshy tags - no mass - brown stool  Mood - irritable Uses a walker to ambulate, slow unsteady gait  CBC Latest Ref Rng & Units 12/13/2015 07/01/2015 06/30/2015  WBC 3.8 -  10.8 K/uL 4.5 6.5 5.2  Hemoglobin 11.7 - 15.5 g/dL 13.8 12.5 11.7(L)  Hematocrit 35.0 - 45.0 % 40.6 37.5 35.4(L)  Platelets 140 - 400 K/uL 229 181 170

## 2016-01-21 ENCOUNTER — Other Ambulatory Visit: Payer: Self-pay | Admitting: Internal Medicine

## 2016-01-21 ENCOUNTER — Encounter: Payer: Self-pay | Admitting: Internal Medicine

## 2016-02-04 DIAGNOSIS — Z1231 Encounter for screening mammogram for malignant neoplasm of breast: Secondary | ICD-10-CM | POA: Diagnosis not present

## 2016-02-04 LAB — HM MAMMOGRAPHY

## 2016-02-13 DIAGNOSIS — G629 Polyneuropathy, unspecified: Secondary | ICD-10-CM | POA: Diagnosis not present

## 2016-02-13 DIAGNOSIS — M25561 Pain in right knee: Secondary | ICD-10-CM | POA: Diagnosis not present

## 2016-02-13 DIAGNOSIS — G8929 Other chronic pain: Secondary | ICD-10-CM | POA: Diagnosis not present

## 2016-02-13 DIAGNOSIS — M545 Low back pain: Secondary | ICD-10-CM | POA: Diagnosis not present

## 2016-02-19 ENCOUNTER — Other Ambulatory Visit: Payer: Self-pay | Admitting: Internal Medicine

## 2016-02-25 ENCOUNTER — Telehealth: Payer: Self-pay

## 2016-02-25 MED ORDER — HYDROCODONE-ACETAMINOPHEN 5-325 MG PO TABS: 1.0000 | ORAL_TABLET | Freq: Four times a day (QID) | ORAL | 0 refills | Status: DC | PRN

## 2016-02-25 MED ORDER — NYSTATIN 100000 UNIT/ML MT SUSP: 5.0000 mL | Freq: Four times a day (QID) | OROMUCOSAL | 0 refills | Status: DC

## 2016-02-25 NOTE — Telephone Encounter (Signed)
Patient's daughter aware rx sent to pharmacy.   Patient's daughter also has questions about approval of Therapy, patient received a letter and it mention she needs to follow-up with her doctor. Dawnne will drop off a copy of the letter for Dr.Reed to review.

## 2016-02-25 NOTE — Telephone Encounter (Signed)
Message left on clinical intake voicemail:   Patient's daughter left message on voicemail, needs rx for Hydrocodone and Nystatin Mouthwash for dry mouth related to diagnosis of sjogren's syndrome.  RX for hydrocodone printed and awaiting signature   Nystatin mouthwash is not on active medication list, please advise

## 2016-02-25 NOTE — Telephone Encounter (Signed)
Okay to give Rx for nystatin mouthwash 5 cc QID please have her bring bottle in to verify this is actually what she is taking

## 2016-03-01 DIAGNOSIS — G629 Polyneuropathy, unspecified: Secondary | ICD-10-CM | POA: Diagnosis not present

## 2016-03-01 DIAGNOSIS — I480 Paroxysmal atrial fibrillation: Secondary | ICD-10-CM | POA: Diagnosis not present

## 2016-03-01 DIAGNOSIS — M545 Low back pain: Secondary | ICD-10-CM | POA: Diagnosis not present

## 2016-03-01 DIAGNOSIS — K581 Irritable bowel syndrome with constipation: Secondary | ICD-10-CM | POA: Diagnosis not present

## 2016-03-01 DIAGNOSIS — F0150 Vascular dementia without behavioral disturbance: Secondary | ICD-10-CM | POA: Diagnosis not present

## 2016-03-01 DIAGNOSIS — M3500 Sicca syndrome, unspecified: Secondary | ICD-10-CM | POA: Diagnosis not present

## 2016-03-01 DIAGNOSIS — M797 Fibromyalgia: Secondary | ICD-10-CM | POA: Diagnosis not present

## 2016-03-01 DIAGNOSIS — G8929 Other chronic pain: Secondary | ICD-10-CM | POA: Diagnosis not present

## 2016-03-01 DIAGNOSIS — I10 Essential (primary) hypertension: Secondary | ICD-10-CM | POA: Diagnosis not present

## 2016-03-01 DIAGNOSIS — M25561 Pain in right knee: Secondary | ICD-10-CM | POA: Diagnosis not present

## 2016-03-07 ENCOUNTER — Ambulatory Visit: Payer: Medicare Other | Admitting: Rheumatology

## 2016-03-08 ENCOUNTER — Telehealth: Payer: Self-pay | Admitting: Rheumatology

## 2016-03-08 NOTE — Telephone Encounter (Signed)
Patient's daughter called and is requesting a refill of plaquenil be sent to [facility] on [address] [facility]. Patient is out of as tomorrow.

## 2016-03-10 NOTE — Telephone Encounter (Signed)
Attempted to contact the patient's daughter and left message for her to call the office. Last Visit: 06/01/15 Next Visit: 04/04/16 Labs: 12/13/15 WNL PLQ Eye Exam: 12/16/13 WNL  We have not filled this prescription for the patient since 12/2014

## 2016-03-22 NOTE — Telephone Encounter (Signed)
Per Mr. Carlyon Shadow will be discussed at patient's appointment in 04/04/16.

## 2016-03-24 ENCOUNTER — Other Ambulatory Visit: Payer: Self-pay | Admitting: Internal Medicine

## 2016-03-24 ENCOUNTER — Other Ambulatory Visit: Payer: Self-pay | Admitting: Physician Assistant

## 2016-04-03 DIAGNOSIS — M19042 Primary osteoarthritis, left hand: Secondary | ICD-10-CM

## 2016-04-03 DIAGNOSIS — Z79899 Other long term (current) drug therapy: Secondary | ICD-10-CM | POA: Insufficient documentation

## 2016-04-03 DIAGNOSIS — M3500 Sicca syndrome, unspecified: Secondary | ICD-10-CM | POA: Insufficient documentation

## 2016-04-03 DIAGNOSIS — M19041 Primary osteoarthritis, right hand: Secondary | ICD-10-CM | POA: Insufficient documentation

## 2016-04-03 NOTE — Progress Notes (Signed)
Office Visit Note  Patient: Ariana [name]             Date of Birth: February 20, 1929           MRN: [id number]             PCP: Hollace Kinnier, DO Referring: Gayland Curry, DO Visit Date: 04/04/2016 Occupation: '@GUAROCC' @    Subjective:  Follow-up Sjogren's and high-risk prescription  History of Present Illness: Ariana [name] is a 83 y.o. female  Last seen 06/01/2015. She had a follow-up appointment but she was unable to keep that appointment.  Patient states that she has not run out of her medication. She states that her daughter coordinates all of this by phone. She continues to take Plaquenil as prescribed. It is giving her some relief. She also is taking pilocarpine and that is helping her as well.  Today her complaint involves both knees bothering her, low back pain, both feet bothering her.  Patient's labs are passed to.  Patient sees Dr. Josephina Shih for her eye care. According to the patient, she did have field of vision test recently and they were normal. We do not have documentation of this. I've encouraged the patient to have them send [place] a copy of the Plaquenil eye exam test as soon as possible.   Activities of Daily Living:  Patient reports morning stiffness for 30 minutes.   Patient Reports nocturnal pain.  Difficulty dressing/grooming: Reports Difficulty climbing stairs: Reports Difficulty getting out of chair: Reports Difficulty using hands for taps, buttons, cutlery, and/or writing: Reports     Review of Systems  Constitutional: Negative for fatigue.  HENT: Negative for mouth sores and mouth dryness.   Eyes: Negative for dryness.  Respiratory: Negative for shortness of breath.   Gastrointestinal: Negative for constipation and diarrhea.  Musculoskeletal: Negative for myalgias and myalgias.  Skin: Negative for sensitivity to sunlight.  Psychiatric/Behavioral: Negative for decreased concentration and sleep disturbance.    PMFS History:  Patient  Active Problem List   Diagnosis Date Noted  . Sjogren's disease ([place]) 04/03/2016  . Primary osteoarthritis of both hands 04/03/2016  . High risk medication use 04/03/2016  . Atrial fibrillation, unspecified 08/16/2015  . Senile dementia 08/16/2015  . Swelling 08/16/2015  . Diastolic dysfunction 77/41/2878  . Hypertonicity, bladder 07/08/2015  . Arterial hypotension   . Bradycardia 06/29/2015  . Chronic lower back pain 06/29/2015  . Paroxysmal atrial fibrillation ([place]) 04/18/2015  . Dehydration 04/18/2015  . Dementia without behavioral disturbance 04/18/2015  . TIA (transient ischemic attack) 12/17/2014  . Paroxysmal a-fib ([place]) 12/16/2014  . Chest pain 12/16/2014  . Acute encephalopathy 12/04/2014  . Fall   . AKI (acute kidney injury) ([place]) 12/03/2014  . History of cerebrovascular disease 09/24/2014  . Falls frequently 07/28/2014  . Infarction of parietal lobe ([place])   . Mild dementia   . CVA (cerebral infarction) 07/26/2014  . Atrial fibrillation with RVR ([place]) 07/03/2014  . Constipation 04/15/2014  . Mixed stress and urge urinary incontinence 11/03/2013  . Therapeutic opioid induced constipation 11/03/2013  . Hemorrhoid 11/03/2013  . Low back pain associated with a spinal disorder other than radiculopathy or spinal stenosis 11/03/2013  . Protein-calorie malnutrition, severe ([place]) 07/22/2013  . UTI (lower urinary tract infection) 07/20/2013  . Prolonged QT interval 07/20/2013  . Osteopenia 07/17/2013  . Palpitations 06/30/2013  . Hereditary and idiopathic peripheral neuropathy 05/22/2013  . Abnormality of gait 12/05/2012  . Headache(784.0) 09/05/2012  . Multinodular thyroid 01/15/2012  .  Neck pain 01/15/2012  . Hypercholesterolemia 07/08/2010  . Tear film insufficiency 07/06/2009  . [place] SYNDROME 07/06/2009  . Urge urinary incontinence 01/06/2009  . COLONIC POLYPS, ADENOMATOUS, HX OF 04/15/2008  . MITRAL VALVE PROLAPSE 11/05/2007  . ANXIETY DEPRESSION 07/02/2007    . Mononeuritis 07/02/2007  . HYPERTENSION, BENIGN 07/02/2007  . GERD 07/02/2007  . Irritable bowel syndrome 07/02/2007  . Fibromyalgia 07/02/2007    Past Medical History:  Diagnosis Date  . Abnormality of gait   . Adenomatous polyp of colon 2002   45m  . Allergic rhinitis   . Anxiety   . Anxiety and depression   . Chronic back pain   . Dementia without behavioral disturbance   . Depression   . Diverticulosis of colon   . Dry eye syndrome   . Dysphagia   . Dysthymic disorder   . Fibromyalgia   . GERD (gastroesophageal reflux disease)   . H/O hiatal hernia   . History of adverse drug reaction   . History of cerebrovascular disease 09/24/2014  . History of recurrent UTIs   . Hypertension, benign   . Irritable bowel syndrome   . Low back pain syndrome   . Memory loss   . Mitral valve prolapse   . Paroxysmal a-fib ([place]   . Peripheral neuropathy ([facility])    "both feet and legs"  . Physical deconditioning   . Sjogren's syndrome ([place]   . Therapeutic opioid-induced constipation ([facility])   . Thyroid nodule   . Urinary incontinence     Family History  Problem Relation Age of Onset  . Heart disease Father     heart attack  . Pneumonia Father   . Heart attack Mother   . Hypertension Mother   . Colon cancer Sister   . Kidney disease Daughter   . Asthma Daughter   . Arthritis Daughter [phone]   osteo,  . Heart disease Son 539   stage 3 CHF(Diastolic /Systolic)  . Throat cancer Brother   . Hypertension Maternal Grandmother    Past Surgical History:  Procedure Laterality Date  . ABDOMINAL HYSTERECTOMY  1967  . APPENDECTOMY    . CARDIAC CATHETERIZATION  02/17/2003   normal L main, LAD free of disease, Cfx free of disease, RCA free of disease (Dr. ALoni Muse Little)  . CATARACT EXTRACTION, BILATERAL    . CHOLECYSTECTOMY  2000  . COLONOSCOPY W/ BIOPSIES     multiple  . DENTAL SURGERY     multiple tooth extractions  . ESOPHAGOGASTRODUODENOSCOPY (EGD) WITH ESOPHAGEAL DILATION N/A  08/23/2012   Procedure: ESOPHAGOGASTRODUODENOSCOPY (EGD) WITH ESOPHAGEAL DILATION;  Surgeon: DMilus Banister MD;  Location: [facility];  Service: Endoscopy;  Laterality: N/A;  . NASAL SEPTUM SURGERY  1980  . NM MYOCAR PERF WALL MOTION  2003   persantine - normal static and dynamic study w/apical thinning and presvered LV function, no ischemia  . SINUS EXPLORATION     ossifiying fibroma  . TEMPOROMANDIBULAR JOINT SURGERY  1986   Dr. CTerence Lux . 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    Patient is Widowed.    Patient has 2 children.    Patient is retired.    Former smoker   Alcohol none   Exercise Walk, exercise chair 4 days a week   POA    Walks with cane  Patient drinks about 1-2 cups of hot tea daily.   Patient is right handed.                    Objective: Vital Signs: BP 131/70   Pulse 83   Resp 12   Ht 5' 3.5" (1.613 m)   Wt 157 lb (71.2 kg)   BMI 27.38 kg/m    Physical Exam  Constitutional: She is oriented to person, place, and time. She appears well-developed and well-nourished.  HENT:  Head: Normocephalic and atraumatic.  Eyes: EOM are normal. Pupils are equal, round, and reactive to light.  Cardiovascular: Normal rate, regular rhythm and normal heart sounds.  Exam reveals no gallop and no friction rub.   No murmur heard. Pulmonary/Chest: Effort normal and breath sounds normal. She has no wheezes. She has no rales.  Abdominal: Soft. Bowel sounds are normal. She exhibits no distension. There is no tenderness. There is no guarding. No hernia.  Musculoskeletal: Normal range of motion. She exhibits no edema, tenderness or deformity.  Lymphadenopathy:    She has no cervical adenopathy.  Neurological: She is alert and oriented to person, place, and time. Coordination normal.  Skin: Skin is warm and dry. Capillary refill takes less than 2  seconds. No rash noted.  Psychiatric: She has a normal mood and affect. Her behavior is normal.     Musculoskeletal Exam:    CDAI Exam: CDAI Homunculus Exam:   Joint Counts:  CDAI Tender Joint count: 0 CDAI Swollen Joint count: 0  Global Assessments:  Patient Global Assessment: 8 Provider Global Assessment: 8  CDAI Calculated Score: 16    Investigation: Findings:  March 2016:  CBC and comprehensive metabolic panel were normal.  Eye exam is due in May 2017  08/03/2015-PLQ    Abstract on 02/10/2016  Component Date Value Ref Range Status  . HM Mammogram 02/04/2016 0-4 Bi-Rad  0-4 Bi-Rad, Self Reported Normal Final  Office Visit on 12/13/2015  Component Date Value Ref Range Status  . TSH 12/13/2015 1.83  mIU/L Final   Comment:   Reference Range   > or = 20 Years  0.40-4.50   Pregnancy Range First trimester  0.26-2.66 Second trimester 0.55-2.73 Third trimester  0.43-2.91     . WBC 12/13/2015 4.5  3.8 - 10.8 K/uL Final  . RBC 12/13/2015 4.49  3.80 - 5.10 MIL/uL Final  . Hemoglobin 12/13/2015 13.8  11.7 - 15.5 g/dL Final  . HCT 12/13/2015 40.6  35.0 - 45.0 % Final  . MCV 12/13/2015 90.4  80.0 - 100.0 fL Final  . MCH 12/13/2015 30.7  27.0 - 33.0 pg Final  . MCHC 12/13/2015 34.0  32.0 - 36.0 g/dL Final  . RDW 12/13/2015 13.8  11.0 - 15.0 % Final  . Platelets 12/13/2015 229  140 - 400 K/uL Final  . MPV 12/13/2015 10.7  7.5 - 12.5 fL Final  . Neutro Abs 12/13/2015 2475  1,500 - 7,800 cells/uL Final  . Lymphs Abs 12/13/2015 1350  850 - 3,900 cells/uL Final  . Monocytes Absolute 12/13/2015 540  200 - 950 cells/uL Final  . Eosinophils Absolute 12/13/2015 135  15 - 500 cells/uL Final  . Basophils Absolute 12/13/2015 0  0 - 200 cells/uL Final  . Neutrophils Relative % 12/13/2015 55  % Final  . Lymphocytes Relative 12/13/2015 30  % Final  . Monocytes Relative 12/13/2015 12  % Final  . Eosinophils Relative 12/13/2015 3  % Final  . Basophils Relative 12/13/2015 0  %  Final  . Smear Review 12/13/2015 Criteria for review not met   Final  . Sodium 12/13/2015 140  135 - 146 mmol/L Final  . Potassium 12/13/2015 4.4  3.5 - 5.3 mmol/L Final  . Chloride 12/13/2015 102  98 - 110 mmol/L Final  . CO2 12/13/2015 28  20 - 31 mmol/L Final  . Glucose, Bld 12/13/2015 115* 65 - 99 mg/dL Final  . BUN 12/13/2015 12  7 - 25 mg/dL Final  . Creat 12/13/2015 0.70  0.60 - 0.88 mg/dL Final   Comment:   For patients > or = 81 years of age: The upper reference limit for Creatinine is approximately 13% higher for people identified as African-American.     . Total Bilirubin 12/13/2015 0.5  0.2 - 1.2 mg/dL Final  . Alkaline Phosphatase 12/13/2015 82  33 - 130 U/L Final  . AST 12/13/2015 22  10 - 35 U/L Final  . ALT 12/13/2015 17  6 - 29 U/L Final  . Total Protein 12/13/2015 7.3  6.1 - 8.1 g/dL Final  . Albumin 12/13/2015 4.3  3.6 - 5.1 g/dL Final  . Calcium 12/13/2015 10.0  8.6 - 10.4 mg/dL Final  . GFR, Est African American 12/13/2015 >89  >=60 mL/min Final  . GFR, Est Non African American 12/13/2015 80  >=60 mL/min Final  . Hgb A1c MFr Bld 12/13/2015 6.0* <5.7 % Final   Comment:   For someone without known diabetes, a hemoglobin A1c value between 5.7% and 6.4% is consistent with prediabetes and should be confirmed with a follow-up test.   For someone with known diabetes, a value <7% indicates that their diabetes is well controlled. A1c targets should be individualized based on duration of diabetes, age, co-morbid conditions and other considerations.   This assay result is consistent with an increased risk of diabetes.   Currently, no consensus exists regarding use of hemoglobin A1c for diagnosis of diabetes in children.     . Mean Plasma Glucose 12/13/2015 126  mg/dL Final  . Cholesterol 12/13/2015 189  125 - 200 mg/dL Final  . Triglycerides 12/13/2015 116  <150 mg/dL Final  . HDL 12/13/2015 53  >=46 mg/dL Final  . Total CHOL/HDL Ratio 12/13/2015 3.6  <=5.0  Ratio Final  . VLDL 12/13/2015 23  <30 mg/dL Final  . LDL Cholesterol 12/13/2015 113  <130 mg/dL Final   Comment:   Total Cholesterol/HDL Ratio:CHD Risk                        Coronary Heart Disease Risk Table                                        Men       Women          1/2 Average Risk              3.4        3.3              Average Risk              5.0        4.4           2X Average Risk              9.6        7.1  3X Average Risk             23.4       11.0 Use the calculated Patient Ratio above and the CHD Risk table  to determine the patient's CHD Risk.       Imaging: Xr Knee 3 View Left  Result Date: 04/04/2016 3 views left knee #1: Moderate severe medial compartment narrowing #2: No CPPD #3: Moderate lateral compartment narrowing #4: Normal tracking of the patella #5: Moderate patellofemoral joint space narrowing Impression: Moderate severe medial osteoarthritis of the left knee.   Xr Knee 3 View Right  Result Date: 04/04/2016 3 views right knee #1: Moderate severe medial compartment narrowing #2: No CPPD #3: Moderate lateral compartment narrowing #4: Normal tracking of the patella #5: Moderate patellofemoral joint space narrowing Impression: Moderate severe medial osteoarthritis of the right knee.   Speciality Comments: No specialty comments available.    Procedures:  Large Joint Inj Date/Time: 04/04/2016 5:36 PM Performed by: Eliezer Lofts Authorized by: Eliezer Lofts   Consent Given by:  Patient Site marked: the procedure site was marked   Timeout: prior to procedure the correct patient, procedure, and site was verified   Indications:  Pain and joint swelling Location:  Knee Site:  R knee Prep: patient was prepped and draped in usual sterile fashion   Needle Size:  27 G Needle Length:  1.5 inches Approach:  Medial Ultrasound Guidance: No   Fluoroscopic Guidance: No   Arthrogram: No   Medications:  1.5 mL lidocaine 1 %; 40 mg  triamcinolone acetonide 40 MG/ML Aspiration Attempted: Yes   Patient tolerance:  Patient tolerated the procedure well with no immediate complications  Right knee was injected with 40 mg of Kenalog mixed with one and half mL's 1% lidocaine without epinephrine. Patient tolerated procedure well. There is no complications.   Allergies: Banana; Codeine; Klonopin [clonazepam]; Meperidine hcl; Norflex [orphenadrine citrate]; Oxycodone-acetaminophen; Propoxyphene hcl; Zoloft [sertraline hcl]; Doxycycline; Naproxen; Penicillins; Phenothiazines; Stelazine; Sulfamethoxazole-trimethoprim; Tolectin [tolmetin sodium]; and Tramadol   Assessment / Plan:     Visit Diagnoses: SJOGREN'S SYNDROME - Plan: CBC with Differential/Platelet, COMPLETE METABOLIC PANEL WITH GFR, Urinalysis, Routine w reflex microscopic  Osteopenia of multiple sites  Primary osteoarthritis of both hands  History of hypertension  History of insomnia  History of UTI  High risk medication use - Plan: CBC with Differential/Platelet, COMPLETE METABOLIC PANEL WITH GFR, Urinalysis, Routine w reflex microscopic  Bilateral chronic knee pain - Plan: XR KNEE 3 VIEW RIGHT, XR KNEE 3 VIEW LEFT   Plan: #1: High-risk prescription plq 200 bid m-f 135 pills w/ 1 refill Seeing dr. Phillip Heal lyles for eye care  #2: Sjogren's. Ongoing dry eyes or dry mouth  Dry mouth and eyes Using pilocarpine 43m bid prn 180 pills w/ 1 refill  #3:  rtc 4 mos  #4:  bil knee pain  both of her knees hurt really bad and I've asked her to select one knee that was worse and she states of the right knee is more painful. I will x-ray both of her knees today.  After informed consent was obtained the site was prepped in usual sterile fashion injected with 40 mg of Kenalog mixed with one half mL's 1% lidocaine without epinephrine. Patient tolerated procedure well. There are no complications.  Patient usually ambulates with the help of a Rollator. She does have a  nurse's aide that cares for her Monday through Friday about 8 hours per day.  #5: I'm refilling her Plaquenil, pilocarpine,  and asking the nurses aide to get an updated Plaquenil eye exam documentation. They recently had seen Dr. Katy Apo for this   #6: CBC with differential, CMP with GFR, urinalysis today.  Orders: Orders Placed This Encounter  Procedures  . Large Joint Injection/Arthrocentesis  . XR KNEE 3 VIEW RIGHT  . XR KNEE 3 VIEW LEFT  . CBC with Differential/Platelet  . COMPLETE METABOLIC PANEL WITH GFR  . Urinalysis, Routine w reflex microscopic   Meds ordered this encounter  Medications  . pilocarpine (SALAGEN) 5 MG tablet    Sig: Take 1 tablet (5 mg total) by mouth 2 (two) times daily.    Dispense:  180 tablet    Refill:  0  . hydroxychloroquine (PLAQUENIL) 200 MG tablet    Sig: 200 mg in the morning and 200 mg in the evening Monday through Friday only. None on Saturday, none on Sunday.    Dispense:  135 tablet    Refill:  1    Face-to-face time spent with patient was 30 minutes. 50% of time was spent in counseling and coordination of care.  Follow-Up Instructions: Return in about 6 months (around 10/02/2016) for sjog//plq 200 bid m-f // pilocarpine // rt knee pain // oa bil kj ;;.   Xandrea Clarey, PA-C  Note - This record has been created using [employer].  Chart creation errors have been sought, but may not always  have been located. Such creation errors do not reflect on  the standard of medical care.

## 2016-04-04 ENCOUNTER — Ambulatory Visit (INDEPENDENT_AMBULATORY_CARE_PROVIDER_SITE_OTHER): Payer: Self-pay

## 2016-04-04 ENCOUNTER — Ambulatory Visit (INDEPENDENT_AMBULATORY_CARE_PROVIDER_SITE_OTHER): Payer: Medicare Other | Admitting: Rheumatology

## 2016-04-04 ENCOUNTER — Encounter: Payer: Self-pay | Admitting: Rheumatology

## 2016-04-04 VITALS — BP 131/70 | HR 83 | Resp 12 | Ht 63.5 in | Wt 157.0 lb

## 2016-04-04 DIAGNOSIS — M3500 Sicca syndrome, unspecified: Secondary | ICD-10-CM

## 2016-04-04 DIAGNOSIS — Z79899 Other long term (current) drug therapy: Secondary | ICD-10-CM

## 2016-04-04 DIAGNOSIS — Z87898 Personal history of other specified conditions: Secondary | ICD-10-CM

## 2016-04-04 DIAGNOSIS — M25562 Pain in left knee: Secondary | ICD-10-CM

## 2016-04-04 DIAGNOSIS — Z8679 Personal history of other diseases of the circulatory system: Secondary | ICD-10-CM

## 2016-04-04 DIAGNOSIS — G8929 Other chronic pain: Secondary | ICD-10-CM

## 2016-04-04 DIAGNOSIS — M19041 Primary osteoarthritis, right hand: Secondary | ICD-10-CM

## 2016-04-04 DIAGNOSIS — Z87440 Personal history of urinary (tract) infections: Secondary | ICD-10-CM

## 2016-04-04 DIAGNOSIS — M25561 Pain in right knee: Secondary | ICD-10-CM

## 2016-04-04 DIAGNOSIS — M19042 Primary osteoarthritis, left hand: Secondary | ICD-10-CM

## 2016-04-04 DIAGNOSIS — M8589 Other specified disorders of bone density and structure, multiple sites: Secondary | ICD-10-CM

## 2016-04-04 LAB — COMPLETE METABOLIC PANEL WITH GFR
ALT: 16 U/L (ref 6–29)
AST: 21 U/L (ref 10–35)
Albumin: 4.3 g/dL (ref 3.6–5.1)
Alkaline Phosphatase: 86 U/L (ref 33–130)
BUN: 21 mg/dL (ref 7–25)
CHLORIDE: 103 mmol/L (ref 98–110)
CO2: 29 mmol/L (ref 20–31)
CREATININE: 0.76 mg/dL (ref 0.60–0.88)
Calcium: 9.9 mg/dL (ref 8.6–10.4)
GFR, EST AFRICAN AMERICAN: 83 mL/min (ref >=60)
GFR, Est Non African American: 72 mL/min (ref >=60)
Glucose, Bld: 89 mg/dL (ref 65–99)
Potassium: 4.3 mmol/L (ref 3.5–5.3)
Sodium: 140 mmol/L (ref 135–146)
Total Bilirubin: 0.4 mg/dL (ref 0.2–1.2)
Total Protein: 7.3 g/dL (ref 6.1–8.1)

## 2016-04-04 LAB — CBC WITH DIFFERENTIAL/PLATELET
BASOS ABS: 0 {cells}/uL (ref 0–200)
Basophils Relative: 0 %
EOS ABS: 228 {cells}/uL (ref 15–500)
Eosinophils Relative: 4 %
HCT: 40 % (ref 35.0–45.0)
Hemoglobin: 13 g/dL (ref 11.7–15.5)
LYMPHS ABS: 1881 {cells}/uL (ref 850–3900)
Lymphocytes Relative: 33 %
MCH: 29.9 pg (ref 27.0–33.0)
MCHC: 32.5 g/dL (ref 32.0–36.0)
MCV: 92 fL (ref 80.0–100.0)
MPV: 10.3 fL (ref 7.5–12.5)
Monocytes Absolute: 684 cells/uL (ref 200–950)
Monocytes Relative: 12 %
NEUTROS ABS: 2907 {cells}/uL (ref 1500–7800)
NEUTROS PCT: 51 %
PLATELETS: 234 10*3/uL (ref 140–400)
RBC: 4.35 MIL/uL (ref 3.80–5.10)
RDW: 13.3 % (ref 11.0–15.0)
WBC: 5.7 10*3/uL (ref 3.8–10.8)

## 2016-04-04 MED ORDER — HYDROXYCHLOROQUINE SULFATE 200 MG PO TABS: ORAL_TABLET | ORAL | 1 refills | Status: DC

## 2016-04-04 MED ORDER — PILOCARPINE HCL 5 MG PO TABS: 5.0000 mg | ORAL_TABLET | Freq: Two times a day (BID) | ORAL | 0 refills | Status: DC

## 2016-04-04 MED ORDER — LIDOCAINE HCL 1 % IJ SOLN
1.5000 mL | INTRAMUSCULAR | Status: AC | PRN
  Administered 2016-04-04: 1.5 mL

## 2016-04-04 MED ORDER — TRIAMCINOLONE ACETONIDE 40 MG/ML IJ SUSP
40.0000 mg | INTRAMUSCULAR | Status: AC | PRN
  Administered 2016-04-04: 40 mg via INTRA_ARTICULAR

## 2016-04-05 ENCOUNTER — Telehealth: Payer: Self-pay

## 2016-04-05 LAB — URINALYSIS, MICROSCOPIC ONLY
Casts: NONE SEEN [LPF]
Crystals: NONE SEEN [HPF]
Yeast: NONE SEEN [HPF]

## 2016-04-05 LAB — URINALYSIS, ROUTINE W REFLEX MICROSCOPIC
Bilirubin Urine: NEGATIVE
Glucose, UA: NEGATIVE
HGB URINE DIPSTICK: NEGATIVE
Ketones, ur: NEGATIVE
NITRITE: POSITIVE — AB
PROTEIN: NEGATIVE
Specific Gravity, Urine: 1.013 (ref 1.001–1.035)
pH: 5.5 (ref 5.0–8.0)

## 2016-04-05 NOTE — Telephone Encounter (Signed)
-----   Message from [place], [place] sent at 04/04/2016  5:33 PM EST ----- Call patient and let them know Moderate severe osteoarthritis of bilateral knees.

## 2016-04-05 NOTE — Telephone Encounter (Signed)
Called and left voicemail advising patient of her results per Mr. Carlyon Shadow concerning bilateral knee.

## 2016-04-10 ENCOUNTER — Ambulatory Visit (INDEPENDENT_AMBULATORY_CARE_PROVIDER_SITE_OTHER): Payer: Medicare Other | Admitting: Internal Medicine

## 2016-04-10 ENCOUNTER — Telehealth: Payer: Self-pay | Admitting: Radiology

## 2016-04-10 ENCOUNTER — Ambulatory Visit: Payer: Medicare Other

## 2016-04-10 ENCOUNTER — Encounter: Payer: Self-pay | Admitting: Internal Medicine

## 2016-04-10 VITALS — BP 128/70 | HR 68 | Temp 97.5°F | Ht 64.0 in | Wt 152.0 lb

## 2016-04-10 DIAGNOSIS — N3946 Mixed incontinence: Secondary | ICD-10-CM

## 2016-04-10 DIAGNOSIS — M6281 Muscle weakness (generalized): Secondary | ICD-10-CM

## 2016-04-10 DIAGNOSIS — M545 Low back pain, unspecified: Secondary | ICD-10-CM

## 2016-04-10 DIAGNOSIS — F341 Dysthymic disorder: Secondary | ICD-10-CM | POA: Diagnosis not present

## 2016-04-10 DIAGNOSIS — F0150 Vascular dementia without behavioral disturbance: Secondary | ICD-10-CM

## 2016-04-10 DIAGNOSIS — K581 Irritable bowel syndrome with constipation: Secondary | ICD-10-CM | POA: Diagnosis not present

## 2016-04-10 MED ORDER — DULOXETINE HCL 30 MG PO CPEP: ORAL_CAPSULE | ORAL | 1 refills | Status: DC

## 2016-04-10 MED ORDER — NYSTATIN 100000 UNIT/ML MT SUSP: 5.0000 mL | Freq: Four times a day (QID) | OROMUCOSAL | 3 refills | Status: DC

## 2016-04-10 MED ORDER — CIPROFLOXACIN HCL 500 MG PO TABS: 500.0000 mg | ORAL_TABLET | Freq: Two times a day (BID) | ORAL | 0 refills | Status: DC

## 2016-04-10 MED ORDER — DULOXETINE HCL 60 MG PO CPEP: ORAL_CAPSULE | ORAL | 3 refills | Status: DC

## 2016-04-10 NOTE — Patient Instructions (Addendum)
Clarify name of calcium--get one she can swallow that contains vitamin D.  Call Dr. Celesta Aver office to clarify if it should be bisacodyl or colace.

## 2016-04-10 NOTE — Progress Notes (Signed)
Location:  [facility] clinic Provider:  Plummer Matich L. Mariea Clonts, D.O., C.M.D.  Code Status: DNR Goals of Care:  Advanced Directives 04/10/2016  Does Patient Have a Medical Advance Directive? Yes  Type of Advance Directive [place]  Does patient want to make changes to medical advance directive? -  Copy of [place] in Chart? Yes  Would patient like information on creating a medical advance directive? -  Pre-existing out of facility DNR order (yellow form or pink MOST form) -     Chief Complaint  Patient presents with  . Annual Exam    CPE  . MMSE    23/30 passed clock   HPI: Patient is a 83 y.o. female seen today for medical management of chronic diseases.  Pt, caregiver, and daughter have a lot of questions about her medication changes made by specialists--GI, rheumatology. This was meant to be her AWV, but there were too many issues with her meds and other visits to get that done today.     MMSE done for vascular dementia and now 23/30.  Continues on namenda.      Pt is still on cymbalta.  Has never been stopped.  Nystatin is also being used daily for thrush.  Used hydrocortisone tablet daily in the past for various skin rashes.    Pt has had dark urine, afebrile, sleeping a lot, and urinalysis was dirty at rheumatology.    There is confusion about what the bowel regimen is supposed to be.  Taking linzess 278mcg for sure.  Also uses the senokot s 2 tablets daily.  ? Using bisacodyl or colace--sounds like using colace every other day is too much.      The cortisone shot at rheumatology helped her right leg since then.  Started just since she's been here with cool rain.  Has moderate to severe OA.   For recertification for therapy due to her stiffness.  She had [facility] before and now has someone else ([facility]).  Says there's been turnover already and staff changing.    December mammogram of both breasts was normal at [facility].  MMSE -  Mini Mental State Exam 04/10/2016 10/06/2015 10/06/2015  Orientation to time 3 3 -  Orientation to Place 5 5 -  Registration 3 3 3   Attention/ Calculation 5 5 5   Recall 1 3 -  Language- name 2 objects 2 2 -  Language- repeat 1 1 -  Language- follow 3 step command 1 3 -  Language- read & follow direction 1 1 -  Write a sentence 0 1 -  Copy design 1 1 -  Total score 23 28 -  passed clock   Past Medical History:  Diagnosis Date  . Abnormality of gait   . Adenomatous polyp of colon 2002   1mm  . Allergic rhinitis   . Anxiety   . Anxiety and depression   . Chronic back pain   . Dementia without behavioral disturbance   . Depression   . Diverticulosis of colon   . Dry eye syndrome   . Dysphagia   . Dysthymic disorder   . Fibromyalgia   . GERD (gastroesophageal reflux disease)   . H/O hiatal hernia   . History of adverse drug reaction   . History of cerebrovascular disease 09/24/2014  . History of recurrent UTIs   . Hypertension, benign   . Irritable bowel syndrome   . Low back pain syndrome   . Memory loss   .  Mitral valve prolapse   . Paroxysmal a-fib ([place])   . Peripheral neuropathy (HCC)    "both feet and legs"  . Physical deconditioning   . Sjogren's syndrome ([place])   . Therapeutic opioid-induced constipation (OIC)   . Thyroid nodule   . Urinary incontinence     Past Surgical History:  Procedure Laterality Date  . ABDOMINAL HYSTERECTOMY  1967  . APPENDECTOMY    . CARDIAC CATHETERIZATION  02/17/2003   normal L main, LAD free of disease, Cfx free of disease, RCA free of disease (Dr. Loni Muse. [facility])  . CATARACT EXTRACTION, BILATERAL    . CHOLECYSTECTOMY  2000  . COLONOSCOPY W/ BIOPSIES     multiple  . DENTAL SURGERY     multiple tooth extractions  . ESOPHAGOGASTRODUODENOSCOPY (EGD) WITH ESOPHAGEAL DILATION N/A 08/23/2012   Procedure: ESOPHAGOGASTRODUODENOSCOPY (EGD) WITH ESOPHAGEAL DILATION;  Surgeon: Milus Banister, MD;  Location: [facility];  Service: Endoscopy;   Laterality: N/A;  . NASAL SEPTUM SURGERY  1980  . NM MYOCAR PERF WALL MOTION  2003   persantine - normal static and dynamic study w/apical thinning and presvered LV function, no ischemia  . SINUS EXPLORATION     ossifiying fibroma  . TEMPOROMANDIBULAR JOINT SURGERY  1986   Dr. Terence Lux  . TRANSTHORACIC ECHOCARDIOGRAM  2001   mild LVH, normal LV    Allergies  Allergen Reactions  . Banana Nausea And Vomiting  . Codeine Nausea Only    unless given with Phenergan  . Klonopin [Clonazepam] Other (See Comments)    Causes hallucination   . Meperidine Hcl Nausea Only    unless given with Phenergan  . Norflex [Orphenadrine Citrate] Nausea Only    Unless given with Phenergan  . Oxycodone-Acetaminophen Nausea Only    unless given with phenergan  . Propoxyphene Hcl Nausea Only    unless given with phenergan  . Zoloft [Sertraline Hcl] Other (See Comments)    Caused lethargy  . Doxycycline Other (See Comments)    Unknown  . Naproxen Other (See Comments)  . Penicillins Other (See Comments)    Unknown  . Phenothiazines Other (See Comments)    Unknown  . Stelazine Other (See Comments)    Unknown  . Sulfamethoxazole-Trimethoprim Other (See Comments)    Unknown  . Tolectin [Tolmetin Sodium] Other (See Comments)    Unknown  . Tramadol Other (See Comments)    Unknown    Allergies as of 04/10/2016      Reactions   Banana Nausea And Vomiting   Codeine Nausea Only   unless given with Phenergan   Klonopin [clonazepam] Other (See Comments)   Causes hallucination   Meperidine Hcl Nausea Only   unless given with Phenergan   Norflex [orphenadrine Citrate] Nausea Only   Unless given with Phenergan   Oxycodone-acetaminophen Nausea Only   unless given with phenergan   Propoxyphene Hcl Nausea Only   unless given with phenergan   Zoloft [sertraline Hcl] Other (See Comments)   Caused lethargy   Doxycycline Other (See Comments)   Unknown   Naproxen Other (See Comments)   Penicillins  Other (See Comments)   Unknown   Phenothiazines Other (See Comments)   Unknown   Stelazine Other (See Comments)   Unknown   Sulfamethoxazole-trimethoprim Other (See Comments)   Unknown   Tolectin [tolmetin Sodium] Other (See Comments)   Unknown   Tramadol Other (See Comments)   Unknown      Medication List       Accurate as of  04/10/16  2:28 PM. Always use your most recent med list.          ALPRAZolam 0.5 MG tablet Commonly known as:  XANAX TAKE ONE TABLET BY MOUTH TWICE DAILY AS NEEDED FOR ANXIETY   aspirin 325 MG tablet Take 1 tablet (325 mg total) by mouth daily.   bisacodyl 5 MG EC tablet Commonly known as:  DULCOLAX Take 10 mg by mouth daily as needed for moderate constipation.   calcium carbonate 500 MG chewable tablet Commonly known as:  TUMS - dosed in mg elemental calcium Chew 1 tablet by mouth daily.   CARTIA XT 180 MG 24 hr capsule Generic drug:  diltiazem TAKE ONE CAPSULE BY MOUTH ONCE DAILY   docusate sodium 100 MG capsule Commonly known as:  COLACE Take 1 capsule (100 mg total) by mouth daily.   DULoxetine 60 MG capsule Commonly known as:  CYMBALTA Take one capsule by mouth in the evening for depression   DULoxetine 30 MG capsule Commonly known as:  CYMBALTA Take one capsule by mouth once daily for back pain and depression   HYDROcodone-acetaminophen 5-325 MG tablet Commonly known as:  NORCO/VICODIN Take 1 tablet by mouth every 6 (six) hours as needed for moderate pain.   hydrocortisone 20 MG tablet Commonly known as:  CORTEF GARGLE AND SWALLOW 1 TEASPOONFUL BY MOUTH FOUR TIMES DAILY   hydroxychloroquine 200 MG tablet Commonly known as:  PLAQUENIL 200 mg in the morning and 200 mg in the evening Monday through Friday only. None on Saturday, none on Sunday.   lansoprazole 30 MG capsule Commonly known as:  PREVACID Take 1 capsule (30 mg total) by mouth daily before breakfast.   LINZESS 290 MCG Caps capsule Generic drug:   linaclotide TAKE ONE CAPSULE BY MOUTH ONCE DAILY BEFORE BREAKFAST   memantine 10 MG tablet Commonly known as:  NAMENDA Take 1 tablet (10 mg total) by mouth 2 (two) times daily.   mirabegron ER 25 MG Tb24 tablet Commonly known as:  MYRBETRIQ Take 1 tablet (25 mg total) by mouth daily.   nystatin 100000 UNIT/ML suspension Commonly known as:  MYCOSTATIN Take 5 mLs (500,000 Units total) by mouth 4 (four) times daily.   pilocarpine 5 MG tablet Commonly known as:  SALAGEN Take 1 tablet (5 mg total) by mouth 2 (two) times daily.   pregabalin 75 MG capsule Commonly known as:  LYRICA Take 1 capsule (75 mg total) by mouth 2 (two) times daily. For pain   REFRESH TEARS 0.5 % Soln Generic drug:  carboxymethylcellulose Place 1 drop into both eyes 2 (two) times daily as needed (dry eyes).   senna 8.6 MG Tabs tablet Commonly known as:  SENOKOT Take 2 tablets (17.2 mg total) by mouth daily.   sodium chloride 0.65 % Soln nasal spray Commonly known as:  OCEAN Place 1 spray into the nose as needed for congestion.      Review of Systems:  Review of Systems  Constitutional: Negative for chills, fever and malaise/fatigue.  HENT: Negative for congestion and hearing loss.   Eyes: Negative for blurred vision.  Respiratory: Negative for cough and shortness of breath.   Cardiovascular: Negative for chest pain, palpitations and leg swelling.  Gastrointestinal: Positive for constipation. Negative for abdominal pain, blood in stool, diarrhea and melena.  Genitourinary: Positive for dysuria, frequency and urgency.       Urine dark due to poor po intake  Musculoskeletal: Positive for back pain and joint pain. Negative for falls.  Skin:  Negative for rash.  Neurological: Positive for weakness. Negative for dizziness and loss of consciousness.  Psychiatric/Behavioral: Positive for depression and memory loss. The patient is nervous/anxious. The patient does not have insomnia.     Health Maintenance   Topic Date Due  . DEXA SCAN  07/04/1996  . MAMMOGRAM  02/03/2017  . TETANUS/TDAP  12/09/2023  . INFLUENZA VACCINE  Completed  . PNA vac Low Risk Adult  Completed    Physical Exam: Vitals:   04/10/16 1340  BP: 128/70  Pulse: 68  Temp: 97.5 F (36.4 C)  TempSrc: Oral  SpO2: 98%  Weight: 152 lb (68.9 kg)  Height: 5\' 4"  (1.626 m)   Body mass index is 26.09 kg/m. Physical Exam  Constitutional: No distress.  HENT:  Head: Normocephalic and atraumatic.  Eyes: EOM are normal. Pupils are equal, round, and reactive to light.  Musculoskeletal: Normal range of motion.  Right knee tenderness, low back tenderness  Neurological: She is alert.  Oriented to person and place, not time and tells stories from the distant past like they just happened  Skin: Skin is warm and dry. There is pallor.  Psychiatric:  Pleasant to me today, but always has a negative story about someone she's seen    Labs reviewed: Basic Metabolic Panel:  Recent Labs  04/19/15 0349  06/29/15 1600  06/30/15 1214  07/08/15 1241 12/13/15 1120 04/04/16 1536  NA  --   < >  --   < >  --   < > 139 140 140  K  --   < >  --   < >  --   < > 4.6 4.4 4.3  CL  --   < >  --   < >  --   < > 96 102 103  CO2  --   < >  --   < >  --   < > 28 28 29   GLUCOSE  --   < >  --   < >  --   < > 94 115* 89  BUN  --   < >  --   < >  --   < > 22 12 21   CREATININE  --   < >  --   < >  --   < > 0.73 0.70 0.76  CALCIUM  --   < >  --   < >  --   < > 10.2 10.0 9.9  MG  --   --  2.0  --   --   --   --   --   --   PHOS  --   --  3.5  --   --   --   --   --   --   TSH 1.680  --   --   --  0.889  --   --  1.83  --   < > = values in this interval not displayed. Liver Function Tests:  Recent Labs  07/01/15 0529 12/13/15 1120 04/04/16 1536  AST 24 22 21   ALT 19 17 16   ALKPHOS 83 82 86  BILITOT 0.4 0.5 0.4  PROT 6.8 7.3 7.3  ALBUMIN 4.0 4.3 4.3   No results for input(s): LIPASE, AMYLASE in the last 8760 hours. No results for  input(s): AMMONIA in the last 8760 hours. CBC:  Recent Labs  07/01/15 0529 12/13/15 1120 04/04/16 1536  WBC 6.5 4.5 5.7  NEUTROABS 3.6 2,475 2,907  HGB 12.5 13.8 13.0  HCT 37.5 40.6 40.0  MCV 90.6 90.4 92.0  PLT 181 229 234   Lipid Panel:  Recent Labs  12/13/15 1120  CHOL 189  HDL 53  LDLCALC 113  TRIG 116  CHOLHDL 3.6   Lab Results  Component Value Date   HGBA1C 6.0 (H) 12/13/2015    Procedures since last visit: Xr Knee 3 View Left  Result Date: 04/04/2016 3 views left knee #1: Moderate severe medial compartment narrowing #2: No CPPD #3: Moderate lateral compartment narrowing #4: Normal tracking of the patella #5: Moderate patellofemoral joint space narrowing Impression: Moderate severe medial osteoarthritis of the left knee.   Xr Knee 3 View Right  Result Date: 04/04/2016 3 views right knee #1: Moderate severe medial compartment narrowing #2: No CPPD #3: Moderate lateral compartment narrowing #4: Normal tracking of the patella #5: Moderate patellofemoral joint space narrowing Impression: Moderate severe medial osteoarthritis of the right knee.   Assessment/Plan 1. ANXIETY DEPRESSION - is on both doses of cymbalta with 60mg  in am and 30mg   - DULoxetine (CYMBALTA) 30 MG capsule; Take one capsule by mouth once daily for back pain and depression  Dispense: 90 capsule; Refill: 1  2. Irritable bowel syndrome with constipation -ongoing, cont linzess, senokot-s and check with Dr. Carlean Purl about bisacodyl vs colace in addition   3. Vascular dementia without behavioral disturbance -likely some AD component with gradually progressive nature with mmse now 23/30 -cont namenda  4. Proximal muscle weakness -ongoing, continue PT at home  5. Low back pain associated with a spinal disorder other than radiculopathy or spinal stenosis -ongoing pain, cont hydrocodone therapy  6. Mixed incontinence urge and stress -cont myrbetriq -this is worsening also with spells of all out  incontinence now per pt  Labs/tests ordered:  No orders of the defined types were placed in this encounter.  Next appt:  07/17/2016 med mgt   [facility] [place] L. Kaytlynn Kochan, D.O. [facility] [address], [place] [address] Cell Phone (Mon-Fri 8am-5pm):  [phone] On Call:  [phone] & follow prompts after 5pm & weekends Office Phone:  [phone] Office Fax:  [phone]

## 2016-04-10 NOTE — Telephone Encounter (Signed)
She has appt with PCP today I have called her to advise left message for her to call us back, she has abnormal U/A with WBC and bacteria. (CBC and CMP are normal)

## 2016-04-10 NOTE — Telephone Encounter (Signed)
Patient's daughter spooner) returned your call regarding her mother's lab results. (amy)

## 2016-04-10 NOTE — Telephone Encounter (Signed)
Called to advise.  

## 2016-04-11 ENCOUNTER — Telehealth: Payer: Self-pay

## 2016-04-11 NOTE — Telephone Encounter (Signed)
Physical Therapist with [facility] called to request verbal orders for physical therapy: 2 week 4 and 1 week 4.  Per [name] standing order, verbal order given. Message will be sent to patient's provider as a FYI.

## 2016-04-13 DIAGNOSIS — M545 Low back pain: Secondary | ICD-10-CM | POA: Diagnosis not present

## 2016-04-13 DIAGNOSIS — G629 Polyneuropathy, unspecified: Secondary | ICD-10-CM | POA: Diagnosis not present

## 2016-04-13 DIAGNOSIS — G8929 Other chronic pain: Secondary | ICD-10-CM | POA: Diagnosis not present

## 2016-04-13 DIAGNOSIS — M25561 Pain in right knee: Secondary | ICD-10-CM | POA: Diagnosis not present

## 2016-04-20 ENCOUNTER — Other Ambulatory Visit: Payer: Self-pay | Admitting: *Deleted

## 2016-04-20 MED ORDER — ALPRAZOLAM 0.5 MG PO TABS: 0.5000 mg | ORAL_TABLET | Freq: Two times a day (BID) | ORAL | 0 refills | Status: DC | PRN

## 2016-04-20 MED ORDER — HYDROCODONE-ACETAMINOPHEN 5-325 MG PO TABS: 1.0000 | ORAL_TABLET | Freq: Four times a day (QID) | ORAL | 0 refills | Status: DC | PRN

## 2016-04-20 NOTE — Telephone Encounter (Signed)
Patient daughter requested and husband will pick up

## 2016-05-16 ENCOUNTER — Ambulatory Visit: Payer: Self-pay | Admitting: Nurse Practitioner

## 2016-05-18 NOTE — Progress Notes (Signed)
This encounter was created in error - please disregard.

## 2016-05-19 ENCOUNTER — Other Ambulatory Visit: Payer: Self-pay | Admitting: Nurse Practitioner

## 2016-05-19 ENCOUNTER — Other Ambulatory Visit: Payer: Self-pay | Admitting: Internal Medicine

## 2016-05-23 ENCOUNTER — Ambulatory Visit (INDEPENDENT_AMBULATORY_CARE_PROVIDER_SITE_OTHER): Payer: Medicare Other | Admitting: Nurse Practitioner

## 2016-05-23 ENCOUNTER — Encounter: Payer: Self-pay | Admitting: Nurse Practitioner

## 2016-05-23 VITALS — BP 118/68 | HR 72 | Temp 97.3°F | Resp 18 | Ht 64.0 in | Wt 154.0 lb

## 2016-05-23 DIAGNOSIS — M25432 Effusion, left wrist: Secondary | ICD-10-CM

## 2016-05-23 DIAGNOSIS — M6281 Muscle weakness (generalized): Secondary | ICD-10-CM

## 2016-05-23 NOTE — Progress Notes (Signed)
Careteam: Patient Care Team: Ariana Curry, DO as PCP - General (Geriatric Medicine) Noralee Space, Ariana [name] as Consulting Physician (Pulmonary Disease) W Evette Cristal, Ariana [name] as Consulting Physician (Obstetrics and Gynecology) Pixie Casino, Ariana [name] as Consulting Physician (Cardiology) Monna Fam, Ariana [name] as Consulting Physician (Ophthalmology) Kathrynn Ducking, Ariana [name] as Consulting Physician (Neurology) Franchot Gallo, Ariana [name] as Consulting Physician (Urology) Lavonna Monarch, Ariana [name] as Consulting Physician (Dermatology) Gatha Mayer, Ariana [name] as Consulting Physician (Gastroenterology)  Advanced Directive information Does Patient Have a Medical Advance Directive?: Yes, Type of Advance Directive: Healthcare Power of Attorney  Allergies  Allergen Reactions  . Banana Nausea And Vomiting  . Codeine Nausea Only    unless given with Phenergan  . Klonopin [Clonazepam] Other (See Comments)    Causes hallucination   . Meperidine Hcl Nausea Only    unless given with Phenergan  . Norflex [Orphenadrine Citrate] Nausea Only    Unless given with Phenergan  . Oxycodone-Acetaminophen Nausea Only    unless given with phenergan  . Propoxyphene Hcl Nausea Only    unless given with phenergan  . Zoloft [Sertraline Hcl] Other (See Comments)    Caused lethargy  . Doxycycline Other (See Comments)    Unknown  . Naproxen Other (See Comments)  . Penicillins Other (See Comments)    Unknown  . Phenothiazines Other (See Comments)    Unknown  . Stelazine Other (See Comments)    Unknown  . Sulfamethoxazole-Trimethoprim Other (See Comments)    Unknown  . Tolectin [Tolmetin Sodium] Other (See Comments)    Unknown  . Tramadol Other (See Comments)    Unknown    Chief Complaint  Patient presents with  . Acute Visit    Pt woke up this morning with left wrist swollen and bruised looking. Pt believes that she slept the wrong way on it. No pain, tenderness, or swelling now.     HPI: Patient is a 83 y.o. female seen in the  office today due to bruised wrist. Has a caregiver with her Monday- Friday 9-5. Has help during the day during the week. Pt with hx of dementia, OA,  Left hand and wrist was swollen this morning. No injury or pain or redness.  Now it has improved without swelling.   Also noted she had a fall last week where her hip and knees have been bothering her.  She was opening the refrigerator and fell backward. Walking a little slow for a few days but this has improved. Otherwise at baseline. Had Home health PT at home they have assessed her after fall and places to come back for more session.  Review of Systems:  Review of Systems  Constitutional: Negative for appetite change, chills, fever and unexpected weight change.  HENT: Negative for congestion and sinus pressure.   Respiratory: Negative for chest tightness and shortness of breath.   Cardiovascular: Negative for chest pain, palpitations and leg swelling.  Gastrointestinal: Negative for diarrhea and nausea.  Musculoskeletal: Positive for arthralgias, back pain, gait problem, joint swelling and myalgias.  Skin: Positive for pallor.  Neurological: Positive for weakness. Negative for dizziness.       Very unsteady gait, uses walker  Psychiatric/Behavioral: Positive for confusion and decreased concentration.    Past Medical History:  Diagnosis Date  . Abnormality of gait   . Adenomatous polyp of colon 2002   60mm  . Allergic rhinitis   . Anxiety   . Anxiety and depression   . Chronic back pain   .  Dementia without behavioral disturbance   . Depression   . Diverticulosis of colon   . Dry eye syndrome   . Dysphagia   . Dysthymic disorder   . Fibromyalgia   . GERD (gastroesophageal reflux disease)   . H/O hiatal hernia   . History of adverse drug reaction   . History of cerebrovascular disease 09/24/2014  . History of recurrent UTIs   . Hypertension, benign   . Irritable bowel syndrome   . Low back pain syndrome   . Memory loss   .  Mitral valve prolapse   . Paroxysmal a-fib ([place])   . Peripheral neuropathy (HCC)    "both feet and legs"  . Physical deconditioning   . Sjogren's syndrome ([place])   . Therapeutic opioid-induced constipation (OIC)   . Thyroid nodule   . Urinary incontinence    Past Surgical History:  Procedure Laterality Date  . ABDOMINAL HYSTERECTOMY  1967  . APPENDECTOMY    . CARDIAC CATHETERIZATION  02/17/2003   normal L main, LAD free of disease, Cfx free of disease, RCA free of disease (Dr. Loni Muse. Little)  . CATARACT EXTRACTION, BILATERAL    . CHOLECYSTECTOMY  2000  . COLONOSCOPY W/ BIOPSIES     multiple  . DENTAL SURGERY     multiple tooth extractions  . ESOPHAGOGASTRODUODENOSCOPY (EGD) WITH ESOPHAGEAL DILATION N/A 08/23/2012   Procedure: ESOPHAGOGASTRODUODENOSCOPY (EGD) WITH ESOPHAGEAL DILATION;  Surgeon: Milus Banister, Ariana [name];  Location: [facility];  Service: Endoscopy;  Laterality: N/A;  . NASAL SEPTUM SURGERY  1980  . NM MYOCAR PERF WALL MOTION  2003   persantine - normal static and dynamic study w/apical thinning and presvered LV function, no ischemia  . SINUS EXPLORATION     ossifiying fibroma  . TEMPOROMANDIBULAR JOINT SURGERY  1986   Dr. Terence Lux  . TRANSTHORACIC ECHOCARDIOGRAM  2001   mild LVH, normal LV   Social History:   reports that she has quit smoking. She has never used smokeless tobacco. She reports that she does not drink alcohol or use drugs.  Family History  Problem Relation Age of Onset  . Heart disease Father     heart attack  . Pneumonia Father   . Heart attack Mother   . Hypertension Mother   . Colon cancer Sister   . Kidney disease Daughter   . Asthma Daughter   . Arthritis Daughter 34    osteo,  . Heart disease Son 52    stage 3 CHF(Diastolic /Systolic)  . Throat cancer Brother   . Hypertension Maternal Grandmother     Medications: Patient's Medications  New Prescriptions   No medications on file  Previous Medications   ALPRAZOLAM (XANAX) 0.5 MG  TABLET    TAKE 1 TABLET BY MOUTH TWICE DAILY AS NEEDED FOR ANXIETY   ASPIRIN 325 MG TABLET    Take 1 tablet (325 mg total) by mouth daily.   BISACODYL (DULCOLAX) 5 MG EC TABLET    Take 10 mg by mouth daily as needed for moderate constipation.   CALCIUM CARBONATE (TUMS - DOSED IN MG ELEMENTAL CALCIUM) 500 MG CHEWABLE TABLET    Chew 1 tablet by mouth daily.   CARBOXYMETHYLCELLULOSE (REFRESH TEARS) 0.5 % SOLN    Place 1 drop into both eyes 2 (two) times daily as needed (dry eyes).    CARTIA XT 180 MG 24 HR CAPSULE    TAKE ONE CAPSULE BY MOUTH ONCE DAILY   DULOXETINE (CYMBALTA) 30 MG CAPSULE    Take one  capsule by mouth once daily for back pain and depression   DULOXETINE (CYMBALTA) 60 MG CAPSULE    Take one capsule by mouth in the evening for depression   HYDROCODONE-ACETAMINOPHEN (NORCO/VICODIN) 5-325 MG TABLET    Take 1 tablet by mouth every 6 (six) hours as needed for moderate pain.   HYDROCORTISONE (CORTEF) 20 MG TABLET    Take 20 mg by mouth daily.   HYDROXYCHLOROQUINE (PLAQUENIL) 200 MG TABLET    200 mg in the morning and 200 mg in the evening Monday through Friday only. None on Saturday, none on Sunday.   LANSOPRAZOLE (PREVACID) 30 MG CAPSULE    Take 1 capsule (30 mg total) by mouth daily before breakfast.   LINZESS 290 MCG CAPS CAPSULE    TAKE ONE CAPSULE BY MOUTH ONCE DAILY BEFORE BREAKFAST   MEMANTINE (NAMENDA) 10 MG TABLET    Take 1 tablet (10 mg total) by mouth 2 (two) times daily.   MIRABEGRON ER (MYRBETRIQ) 25 MG TB24 TABLET    Take 1 tablet (25 mg total) by mouth daily.   NYSTATIN (MYCOSTATIN) 100000 UNIT/ML SUSPENSION    Take 5 mLs (500,000 Units total) by mouth 4 (four) times daily.   PILOCARPINE (SALAGEN) 5 MG TABLET    Take 1 tablet (5 mg total) by mouth 2 (two) times daily.   PREGABALIN (LYRICA) 75 MG CAPSULE    Take 1 capsule (75 mg total) by mouth 2 (two) times daily. For pain   SENNA (SENOKOT) 8.6 MG TABS TABLET    Take 2 tablets (17.2 mg total) by mouth daily.   SODIUM  CHLORIDE (OCEAN) 0.65 % SOLN NASAL SPRAY    Place 1 spray into the nose as needed for congestion.  Modified Medications   No medications on file  Discontinued Medications   CIPROFLOXACIN (CIPRO) 500 MG TABLET    Take 1 tablet (500 mg total) by mouth 2 (two) times daily.     Physical Exam:  Vitals:   05/23/16 1510  BP: 118/68  Pulse: 72  Resp: 18  Temp: 97.3 F (36.3 C)  TempSrc: Oral  SpO2: 96%  Weight: 154 lb (69.9 kg)  Height: 5\' 4"  (1.626 m)   Body mass index is 26.43 kg/m.  Physical Exam  Constitutional: No distress.  HENT:  Head: Normocephalic and atraumatic.  Eyes: EOM are normal. Pupils are equal, round, and reactive to light.  Cardiovascular: Normal rate, regular rhythm and normal heart sounds.   Pulmonary/Chest: Effort normal and breath sounds normal.  Musculoskeletal: Normal range of motion. She exhibits no edema or tenderness.  Right knee tenderness, low back tenderness No swelling, tenderness, color change or warmth to left wrist.   Neurological: She is alert.  Oriented to person   Skin: Skin is warm and dry. There is pallor.    Labs reviewed: Basic Metabolic Panel:  Recent Labs  06/29/15 1600  06/30/15 1214  07/08/15 1241 12/13/15 1120 04/04/16 1536  NA  --   < >  --   < > 139 140 140  K  --   < >  --   < > 4.6 4.4 4.3  CL  --   < >  --   < > 96 102 103  CO2  --   < >  --   < > 28 28 29   GLUCOSE  --   < >  --   < > 94 115* 89  BUN  --   < >  --   < >  22 12 21   CREATININE  --   < >  --   < > 0.73 0.70 0.76  CALCIUM  --   < >  --   < > 10.2 10.0 9.9  MG 2.0  --   --   --   --   --   --   PHOS 3.5  --   --   --   --   --   --   TSH  --   --  0.889  --   --  1.83  --   < > = values in this interval not displayed. Liver Function Tests:  Recent Labs  07/01/15 0529 12/13/15 1120 04/04/16 1536  AST 24 22 21   ALT 19 17 16   ALKPHOS 83 82 86  BILITOT 0.4 0.5 0.4  PROT 6.8 7.3 7.3  ALBUMIN 4.0 4.3 4.3   No results for input(s): LIPASE,  AMYLASE in the last 8760 hours. No results for input(s): AMMONIA in the last 8760 hours. CBC:  Recent Labs  07/01/15 0529 12/13/15 1120 04/04/16 1536  WBC 6.5 4.5 5.7  NEUTROABS 3.6 2,475 2,907  HGB 12.5 13.8 13.0  HCT 37.5 40.6 40.0  MCV 90.6 90.4 92.0  PLT 181 229 234   Lipid Panel:  Recent Labs  12/13/15 1120  CHOL 189  HDL 53  LDLCALC 113  TRIG 116  CHOLHDL 3.6   TSH:  Recent Labs  06/30/15 1214 12/13/15 1120  TSH 0.889 1.83   A1C: Lab Results  Component Value Date   HGBA1C 6.0 (H) 12/13/2015     Assessment/Plan 1. Swelling of joint of left wrist Resolve.   2. Proximal muscle weakness -called and home heath planning to come back to home for further sessions after fall. Pt without acute pain or abnormal exam during visit today   Elianys Conry K. Harle Battiest  [facility] [phone] 8 am - 5 pm) [phone] (after hours)

## 2016-05-30 ENCOUNTER — Other Ambulatory Visit: Payer: Self-pay | Admitting: Adult Health

## 2016-06-09 ENCOUNTER — Telehealth: Payer: Self-pay | Admitting: *Deleted

## 2016-06-09 MED ORDER — CIPROFLOXACIN HCL 250 MG PO TABS: ORAL_TABLET | ORAL | 0 refills | Status: DC

## 2016-06-09 NOTE — Telephone Encounter (Signed)
Patient daughter Deeana called and stated that patient is showing sign of another UTI. Sleeping a lot, Imagination is running wild, Urinary Frequency and Attitude is bad. Daughter wants to know if something can be called to pharmacy for patient to start on because she doesn't want patient to end up at the hospital. Please Advise.    Patient called c/o possible UrinaryTract Infection (UTI)  1. What symptoms are you having (frequency, urgency, dysuria, incontinence, confusion)? Frequency,   2. Any fever or chills? Chills all the time, no known fever.  3. Any suprapubic pain? No   4. Have you taken anything OTC for symptoms? no  5. How much water are you drinking daily? Drinking water daily  6. How long have you had your symptoms (onset)? A  Couple weeks ago but is gradually getting worse.   I will forward this information to your provider and call with instructions, if your symptoms persist or progress seek medical attention at your nearest urgent care or emergency room. Patient verbalized understanding.

## 2016-06-09 NOTE — Telephone Encounter (Signed)
Patient daughter notified and agreed. Rx faxed to pharmacy.  

## 2016-06-09 NOTE — Telephone Encounter (Signed)
Begin cipro 250mg  po bid for 7 days

## 2016-06-12 ENCOUNTER — Telehealth: Payer: Self-pay

## 2016-06-12 DIAGNOSIS — G8929 Other chronic pain: Secondary | ICD-10-CM | POA: Diagnosis not present

## 2016-06-12 DIAGNOSIS — G629 Polyneuropathy, unspecified: Secondary | ICD-10-CM | POA: Diagnosis not present

## 2016-06-12 DIAGNOSIS — M545 Low back pain: Secondary | ICD-10-CM | POA: Diagnosis not present

## 2016-06-12 DIAGNOSIS — M25561 Pain in right knee: Secondary | ICD-10-CM | POA: Diagnosis not present

## 2016-06-12 NOTE — Telephone Encounter (Signed)
Incoming faxed received to initiate Prior authorization for myrbetriq 25 mg from [place].  PA completed via covermymeds. Key [id number] Awaiting response from insurance company

## 2016-06-13 NOTE — Telephone Encounter (Signed)
Received fax from [facility] [phone] and Myrbetriq was APPROVED through 06/01/2017 Member #: [id number]

## 2016-06-14 DIAGNOSIS — M3500 Sicca syndrome, unspecified: Secondary | ICD-10-CM | POA: Diagnosis not present

## 2016-06-14 DIAGNOSIS — F329 Major depressive disorder, single episode, unspecified: Secondary | ICD-10-CM | POA: Diagnosis not present

## 2016-06-14 DIAGNOSIS — I10 Essential (primary) hypertension: Secondary | ICD-10-CM | POA: Diagnosis not present

## 2016-06-14 DIAGNOSIS — G8929 Other chronic pain: Secondary | ICD-10-CM | POA: Diagnosis not present

## 2016-06-14 DIAGNOSIS — M545 Low back pain: Secondary | ICD-10-CM | POA: Diagnosis not present

## 2016-06-14 DIAGNOSIS — M25561 Pain in right knee: Secondary | ICD-10-CM | POA: Diagnosis not present

## 2016-06-14 DIAGNOSIS — G629 Polyneuropathy, unspecified: Secondary | ICD-10-CM | POA: Diagnosis not present

## 2016-06-14 DIAGNOSIS — M797 Fibromyalgia: Secondary | ICD-10-CM | POA: Diagnosis not present

## 2016-06-14 DIAGNOSIS — F0150 Vascular dementia without behavioral disturbance: Secondary | ICD-10-CM | POA: Diagnosis not present

## 2016-06-14 DIAGNOSIS — I480 Paroxysmal atrial fibrillation: Secondary | ICD-10-CM | POA: Diagnosis not present

## 2016-06-23 ENCOUNTER — Telehealth: Payer: Self-pay

## 2016-06-23 DIAGNOSIS — Z599 Problem related to housing and economic circumstances, unspecified: Secondary | ICD-10-CM

## 2016-06-23 DIAGNOSIS — Z598 Other problems related to housing and economic circumstances: Secondary | ICD-10-CM

## 2016-06-23 MED ORDER — ALPRAZOLAM 0.5 MG PO TABS: 0.5000 mg | ORAL_TABLET | Freq: Two times a day (BID) | ORAL | 0 refills | Status: DC | PRN

## 2016-06-23 MED ORDER — HYDROCODONE-ACETAMINOPHEN 5-325 MG PO TABS: 1.0000 | ORAL_TABLET | Freq: Four times a day (QID) | ORAL | 0 refills | Status: DC | PRN

## 2016-06-23 NOTE — Telephone Encounter (Signed)
FYI- patient had a fall last night, patient's Rolator got caught on the coffee table. Patient c/o tailbone pain and bruise on arm. Please advise  Patient is requesting refills on Alprazolam and Hydrocodone. Patient's son-in-law will have to pick rx's up.  Dr.Reed please advise if ok to fill medications, patient is a + fall risk

## 2016-06-23 NOTE — Telephone Encounter (Signed)
Done

## 2016-06-23 NOTE — Telephone Encounter (Signed)
Patient daughter also mentioned financial concerns with personal caregiver. Family is paying out of pocket and will soon have to do without a caregiver due to cost. Ariana [name] is interested in a C3 referral to see what resources are available to help in this situation   Referral pending, please complete

## 2016-06-23 NOTE — Telephone Encounter (Signed)
RX's printed for signature. Patient can only come in the pm, appointment scheduled with Dr.Reed for Thursday 06/29/16 for frequent falls.  Please advise if any xrays or something can be done for tailbone pain. Patient' daughter states she never knows how serious patient's falls are.

## 2016-06-23 NOTE — Telephone Encounter (Signed)
Unfortunately, she has been on these long term and I have not had good success tapering them off.  If they are due, it's ok to renew them.  Sounds like she needs an appointment to be seen early next week if possible.

## 2016-06-29 ENCOUNTER — Ambulatory Visit (INDEPENDENT_AMBULATORY_CARE_PROVIDER_SITE_OTHER): Payer: Medicare Other | Admitting: Internal Medicine

## 2016-06-29 ENCOUNTER — Encounter: Payer: Self-pay | Admitting: Internal Medicine

## 2016-06-29 VITALS — BP 120/80 | HR 67 | Temp 98.0°F | Wt 157.0 lb

## 2016-06-29 DIAGNOSIS — I480 Paroxysmal atrial fibrillation: Secondary | ICD-10-CM | POA: Diagnosis not present

## 2016-06-29 DIAGNOSIS — R296 Repeated falls: Secondary | ICD-10-CM | POA: Diagnosis not present

## 2016-06-29 DIAGNOSIS — M545 Low back pain, unspecified: Secondary | ICD-10-CM

## 2016-06-29 DIAGNOSIS — K581 Irritable bowel syndrome with constipation: Secondary | ICD-10-CM

## 2016-06-29 DIAGNOSIS — F341 Dysthymic disorder: Secondary | ICD-10-CM | POA: Diagnosis not present

## 2016-06-29 DIAGNOSIS — Z599 Problem related to housing and economic circumstances, unspecified: Secondary | ICD-10-CM

## 2016-06-29 DIAGNOSIS — Z598 Other problems related to housing and economic circumstances: Secondary | ICD-10-CM | POA: Diagnosis not present

## 2016-06-29 NOTE — Progress Notes (Signed)
Location:  [facility] clinic Provider: Cleland Simkins L. Mariea Clonts, D.O., C.M.D.  Code Status: DNR Goals of Care:  Advanced Directives 06/29/2016  Does Patient Have a Medical Advance Directive? Yes  Type of Advance Directive [place]  Does patient want to make changes to medical advance directive? -  Copy of [place] in Chart? Yes  Would patient like information on creating a medical advance directive? -  Pre-existing out of facility DNR order (yellow form or pink MOST form) -   Chief Complaint  Patient presents with  . Acute Visit    frequent falls    HPI: Patient is a 83 y.o. female seen today for an acute visit for frequent falls.    Having more pain in her lower back, legs and knees since the fall.  She had one bruise on the outside of her forearm and upper arm.  Tailbone was very painful Friday.  She is much slower walking.  She's been laying on heating pads.  She had therapy on Wednesday/yesterday.  Somehow her rollator walker got tangled into a table and items all came off of the table.  Pt was on the couch when caregiver arrived.  The fall was said to have occurred during the night.    They are running out of money.  Her daughter and son in law are paying for her care.   Turned out her husband was not service connected.     She is quite crabby today.  She is aching.    No difficulty with RVR lately.    Low back pain chronically and now tailbone more painful after fall.  Using heating pad with benefit.    IBS with constipation.  Improved with use of the linzess 29mcg.    Past Medical History:  Diagnosis Date  . Abnormality of gait   . Adenomatous polyp of colon 2002   42mm  . Allergic rhinitis   . Anxiety   . Anxiety and depression   . Chronic back pain   . Dementia without behavioral disturbance   . Depression   . Diverticulosis of colon   . Dry eye syndrome   . Dysphagia   . Dysthymic disorder   . Fibromyalgia   . GERD  (gastroesophageal reflux disease)   . H/O hiatal hernia   . History of adverse drug reaction   . History of cerebrovascular disease 09/24/2014  . History of recurrent UTIs   . Hypertension, benign   . Irritable bowel syndrome   . Low back pain syndrome   . Memory loss   . Mitral valve prolapse   . Paroxysmal A-fib ([place])   . Peripheral neuropathy    "both feet and legs"  . Physical deconditioning   . Sjogren's syndrome ([place])   . Therapeutic opioid-induced constipation (OIC)   . Thyroid nodule   . Urinary incontinence     Past Surgical History:  Procedure Laterality Date  . ABDOMINAL HYSTERECTOMY  1967  . APPENDECTOMY    . CARDIAC CATHETERIZATION  02/17/2003   normal L main, LAD free of disease, Cfx free of disease, RCA free of disease (Dr. Loni Muse. Little)  . CATARACT EXTRACTION, BILATERAL    . CHOLECYSTECTOMY  2000  . COLONOSCOPY W/ BIOPSIES     multiple  . DENTAL SURGERY     multiple tooth extractions  . ESOPHAGOGASTRODUODENOSCOPY (EGD) WITH ESOPHAGEAL DILATION N/A 08/23/2012   Procedure: ESOPHAGOGASTRODUODENOSCOPY (EGD) WITH ESOPHAGEAL DILATION;  Surgeon: Milus Banister, MD;  Location:  [facility];  Service: Endoscopy;  Laterality: N/A;  . NASAL SEPTUM SURGERY  1980  . NM MYOCAR PERF WALL MOTION  2003   persantine - normal static and dynamic study w/apical thinning and presvered LV function, no ischemia  . SINUS EXPLORATION     ossifiying fibroma  . TEMPOROMANDIBULAR JOINT SURGERY  1986   Dr. Terence Lux  . TRANSTHORACIC ECHOCARDIOGRAM  2001   mild LVH, normal LV    Allergies  Allergen Reactions  . Banana Nausea And Vomiting  . Codeine Nausea Only    unless given with Phenergan  . Klonopin [Clonazepam] Other (See Comments)    Causes hallucination   . Meperidine Hcl Nausea Only    unless given with Phenergan  . Norflex [Orphenadrine Citrate] Nausea Only    Unless given with Phenergan  . Oxycodone-Acetaminophen Nausea Only    unless given with phenergan  .  Propoxyphene Hcl Nausea Only    unless given with phenergan  . Zoloft [Sertraline Hcl] Other (See Comments)    Caused lethargy  . Doxycycline Other (See Comments)    Unknown  . Naproxen Other (See Comments)  . Penicillins Other (See Comments)    Unknown  . Phenothiazines Other (See Comments)    Unknown  . Stelazine Other (See Comments)    Unknown  . Sulfamethoxazole-Trimethoprim Other (See Comments)    Unknown  . Tolectin [Tolmetin Sodium] Other (See Comments)    Unknown  . Tramadol Other (See Comments)    Unknown    Allergies as of 06/29/2016      Reactions   Banana Nausea And Vomiting   Codeine Nausea Only   unless given with Phenergan   Klonopin [clonazepam] Other (See Comments)   Causes hallucination   Meperidine Hcl Nausea Only   unless given with Phenergan   Norflex [orphenadrine Citrate] Nausea Only   Unless given with Phenergan   Oxycodone-acetaminophen Nausea Only   unless given with phenergan   Propoxyphene Hcl Nausea Only   unless given with phenergan   Zoloft [sertraline Hcl] Other (See Comments)   Caused lethargy   Doxycycline Other (See Comments)   Unknown   Naproxen Other (See Comments)   Penicillins Other (See Comments)   Unknown   Phenothiazines Other (See Comments)   Unknown   Stelazine Other (See Comments)   Unknown   Sulfamethoxazole-trimethoprim Other (See Comments)   Unknown   Tolectin [tolmetin Sodium] Other (See Comments)   Unknown   Tramadol Other (See Comments)   Unknown      Medication List       Accurate as of 06/29/16  3:28 PM. Always use your most recent med list.          ALPRAZolam 0.5 MG tablet Commonly known as:  XANAX Take 1 tablet (0.5 mg total) by mouth 2 (two) times daily as needed. for anxiety   aspirin 325 MG tablet Take 1 tablet (325 mg total) by mouth daily.   bisacodyl 5 MG EC tablet Commonly known as:  DULCOLAX Take 10 mg by mouth daily as needed for moderate constipation.   calcium carbonate 500 MG  chewable tablet Commonly known as:  TUMS - dosed in mg elemental calcium Chew 1 tablet by mouth daily.   CARTIA XT 180 MG 24 hr capsule Generic drug:  diltiazem TAKE ONE CAPSULE BY MOUTH ONCE DAILY   DULoxetine 30 MG capsule Commonly known as:  CYMBALTA Take one capsule by mouth once daily for back pain and depression   DULoxetine 60  MG capsule Commonly known as:  CYMBALTA Take one capsule by mouth in the evening for depression   HYDROcodone-acetaminophen 5-325 MG tablet Commonly known as:  NORCO/VICODIN Take 1 tablet by mouth every 6 (six) hours as needed for moderate pain.   hydrocortisone 20 MG tablet Commonly known as:  CORTEF Take 20 mg by mouth daily.   hydroxychloroquine 200 MG tablet Commonly known as:  PLAQUENIL 200 mg in the morning and 200 mg in the evening Monday through Friday only. None on Saturday, none on Sunday.   lansoprazole 30 MG capsule Commonly known as:  PREVACID Take 1 capsule (30 mg total) by mouth daily before breakfast.   LINZESS 290 MCG Caps capsule Generic drug:  linaclotide TAKE ONE CAPSULE BY MOUTH ONCE DAILY BEFORE BREAKFAST   memantine 10 MG tablet Commonly known as:  NAMENDA TAKE ONE TABLET BY MOUTH TWICE DAILY   mirabegron ER 25 MG Tb24 tablet Commonly known as:  MYRBETRIQ Take 1 tablet (25 mg total) by mouth daily.   nystatin 100000 UNIT/ML suspension Commonly known as:  MYCOSTATIN Take 5 mLs (500,000 Units total) by mouth 4 (four) times daily.   pilocarpine 5 MG tablet Commonly known as:  SALAGEN Take 1 tablet (5 mg total) by mouth 2 (two) times daily.   pregabalin 75 MG capsule Commonly known as:  LYRICA Take 1 capsule (75 mg total) by mouth 2 (two) times daily. For pain   REFRESH TEARS 0.5 % Soln Generic drug:  carboxymethylcellulose Place 1 drop into both eyes 2 (two) times daily as needed (dry eyes).   senna 8.6 MG Tabs tablet Commonly known as:  SENOKOT Take 2 tablets (17.2 mg total) by mouth daily.   sodium  chloride 0.65 % Soln nasal spray Commonly known as:  OCEAN Place 1 spray into the nose as needed for congestion.       Review of Systems:  Review of Systems  Constitutional: Positive for malaise/fatigue. Negative for chills and fever.  HENT: Positive for hearing loss. Negative for congestion.   Eyes: Negative for blurred vision.  Respiratory: Negative for cough and shortness of breath.   Cardiovascular: Positive for leg swelling. Negative for chest pain and palpitations.  Gastrointestinal: Positive for abdominal pain, constipation and diarrhea. Negative for blood in stool and melena.       Bloating  Genitourinary: Positive for urgency. Negative for dysuria.  Musculoskeletal: Positive for back pain, falls, joint pain and myalgias.  Skin: Negative for itching and rash.  Neurological: Positive for tingling, sensory change and weakness. Negative for dizziness and loss of consciousness.  Endo/Heme/Allergies: Bruises/bleeds easily.  Psychiatric/Behavioral: Positive for depression and memory loss.    Health Maintenance  Topic Date Due  . DEXA SCAN  07/04/1996  . INFLUENZA VACCINE  09/13/2016  . MAMMOGRAM  02/03/2017  . TETANUS/TDAP  12/09/2023  . PNA vac Low Risk Adult  Completed    Physical Exam: Vitals:   06/29/16 1517  BP: 120/80  Pulse: 67  Temp: 98 F (36.7 C)  TempSrc: Oral  SpO2: 96%  Weight: 157 lb (71.2 kg)   Body mass index is 26.95 kg/m. Physical Exam  Constitutional: No distress.  HENT:  Head: Normocephalic.  Eyes: EOM are normal. Pupils are equal, round, and reactive to light.  Neck: Neck supple. No JVD present.  Cardiovascular:  irreg irreg  Pulmonary/Chest: Effort normal and breath sounds normal. No respiratory distress.  Abdominal: Bowel sounds are normal. She exhibits distension. She exhibits no mass. There is no tenderness. There  is no rebound and no guarding. No hernia.  Musculoskeletal:  Walks with walker, needs some help with transfers; no  tenderness over sacrum or coccyx where she c/o pain  Lymphadenopathy:    She has no cervical adenopathy.  Neurological: She is alert.  Oriented to person and place, not time, tells stories from the past like they just happened  Skin: Skin is warm and dry. Capillary refill takes less than 2 seconds. There is pallor.  Abrasion left knee  Psychiatric:  In grumpy mood today    Labs reviewed: Basic Metabolic Panel:  Recent Labs  07/08/15 1241 12/13/15 1120 04/04/16 1536  NA 139 140 140  K 4.6 4.4 4.3  CL 96 102 103  CO2 28 28 29   GLUCOSE 94 115* 89  BUN 22 12 21   CREATININE 0.73 0.70 0.76  CALCIUM 10.2 10.0 9.9  TSH  --  1.83  --    Liver Function Tests:  Recent Labs  07/01/15 0529 12/13/15 1120 04/04/16 1536  AST 24 22 21   ALT 19 17 16   ALKPHOS 83 82 86  BILITOT 0.4 0.5 0.4  PROT 6.8 7.3 7.3  ALBUMIN 4.0 4.3 4.3   No results for input(s): LIPASE, AMYLASE in the last 8760 hours. No results for input(s): AMMONIA in the last 8760 hours. CBC:  Recent Labs  07/01/15 0529 12/13/15 1120 04/04/16 1536  WBC 6.5 4.5 5.7  NEUTROABS 3.6 2,475 2,907  HGB 12.5 13.8 13.0  HCT 37.5 40.6 40.0  MCV 90.6 90.4 92.0  PLT 181 229 234   Lipid Panel:  Recent Labs  12/13/15 1120  CHOL 189  HDL 53  LDLCALC 113  TRIG 116  CHOLHDL 3.6   Lab Results  Component Value Date   HGBA1C 6.0 (H) 12/13/2015   Assessment/Plan 1. Recurrent falls while walking -ongoing, unfortunately, she had too little money for AL and owns her home making snf placement extra challenging -per her daughter, they are "running out of money" and cannot afford meds and the caregivers during the day, need help from the county if possible or from somewhere -referred to [facility] for assistance--daughter advised me that [facility] or her colleague will need to leave a message b/c she does not answer unknown numbers -cont use of walker, home health therapy -use tylenol and norco for pain as ordered but be careful to  monitor her after norco  2. Financial difficulties -see #1 -given samples of linzess, myrbetriq and lyrica  3. ANXIETY DEPRESSION -seems worse today--very irritable probably due to being sore from falls  4. Irritable bowel syndrome with constipation -cont linzess for this which seems to have helped some though she still complains her abdomen is "big"  5. Low back pain associated with a spinal disorder other than radiculopathy or spinal stenosis -cont chronic pain mgt with norco and lyrica, heat for soreness in sacrum/coccyx after fall--does not seem that xrays are needed as she is still ambulatory and not tender on exam  6. Paroxysmal A-fib ([facility]) -cont cartia xt, asa and monitor, not a good candidate for blood thinners as falls at least 2-3 times between 3 month visits -reviewed again that meds contribute to falls, but she unfortunately requires some of these meds and did not tolerate reduction in xanax historically either  Labs/tests ordered:  No orders of the defined types were placed in this encounter.   Next appt:  07/17/2016  Kiauna Zywicki L. Anakin Varkey, D.O. [facility] [address]  [place], [place] [address] Cell Phone (Mon-Fri 8am-5pm):  [phone] On Call:  [phone] & follow prompts after 5pm & weekends Office Phone:  [phone] Office Fax:  [phone]

## 2016-07-05 ENCOUNTER — Ambulatory Visit: Payer: Medicare Other | Admitting: Neurology

## 2016-07-17 ENCOUNTER — Ambulatory Visit (INDEPENDENT_AMBULATORY_CARE_PROVIDER_SITE_OTHER): Payer: Medicare Other | Admitting: Internal Medicine

## 2016-07-17 ENCOUNTER — Encounter: Payer: Self-pay | Admitting: Internal Medicine

## 2016-07-17 VITALS — BP 110/60 | HR 74 | Temp 97.5°F | Wt 160.0 lb

## 2016-07-17 DIAGNOSIS — Z79899 Other long term (current) drug therapy: Secondary | ICD-10-CM | POA: Diagnosis not present

## 2016-07-17 DIAGNOSIS — F341 Dysthymic disorder: Secondary | ICD-10-CM

## 2016-07-17 DIAGNOSIS — I480 Paroxysmal atrial fibrillation: Secondary | ICD-10-CM | POA: Diagnosis not present

## 2016-07-17 DIAGNOSIS — R296 Repeated falls: Secondary | ICD-10-CM

## 2016-07-17 DIAGNOSIS — F0150 Vascular dementia without behavioral disturbance: Secondary | ICD-10-CM

## 2016-07-17 DIAGNOSIS — I5032 Chronic diastolic (congestive) heart failure: Secondary | ICD-10-CM

## 2016-07-17 MED ORDER — ALPRAZOLAM 0.25 MG PO TABS: 0.2500 mg | ORAL_TABLET | Freq: Two times a day (BID) | ORAL | 0 refills | Status: DC | PRN

## 2016-07-17 MED ORDER — FUROSEMIDE 40 MG PO TABS: 40.0000 mg | ORAL_TABLET | Freq: Every day | ORAL | 3 refills | Status: DC

## 2016-07-17 MED ORDER — POTASSIUM CHLORIDE CRYS ER 20 MEQ PO TBCR: 20.0000 meq | EXTENDED_RELEASE_TABLET | Freq: Every day | ORAL | 3 refills | Status: DC

## 2016-07-17 NOTE — Progress Notes (Signed)
Location:  [facility] clinic Provider:  Hadiyah Maricle L. Mariea Clonts, D.O., C.M.D.  Code Status: DNR Goals of Care:  Advanced Directives 07/17/2016  Does Patient Have a Medical Advance Directive? Yes  Type of Advance Directive [place]  Does patient want to make changes to medical advance directive? -  Copy of [place] in Chart? Yes  Would patient like information on creating a medical advance directive? -  Pre-existing out of facility DNR order (yellow form or pink MOST form) -     Chief Complaint  Patient presents with  . Medical Management of Chronic Issues    32mth follow-up    HPI: Patient is a 83 y.o. female seen today for medical management of chronic diseases.  When asked how she's doing, she says "terrible" which is customary for her.  She says she still hurts from her most recent fall.    Dentures have been hurting her.  She's soaking them.  At one point she was treated for what sounds like thrush.  It's unclear how long ago that was--she's here with her caregiver and her daughter is not here this time.  It kept bothering her anyway.  She also has a severe dry mouth from her Sjogren's.  She goes into a story about the cost of the dentures and the duration of the appts, etc.  She sees Dr. Prudencio Burly for eye exams due to plaquenil for the sjogren's.  Ankles are more swollen.  Feet are also.  Wt 160 lbs.  This has been trending up over the past couple of months.   She always complains of abdominal distention.    Followed with Dr. Claudette Head appt 08/16/15.  Had echo 07/27/14 and it showed EF 85-46%, grade 2 diastolic dysfunction.  She and her caregiver report that when she saw rheumatology, no meds were added or changed, but cortef is on her med list and could explain fluid weight gain and abdominal weight.  She did get a steroid injection back on 2/20 in the right knee.  She says it didn't help.  I see a phone note from 2/26, but it does not have any orders attached and  there is no mention of prescribing cortef to patient.  Her caregiver and pt say she is not taking this and does not have it at home. Advised to call the rheum office for clarification.  Fell during therapy.  Slid down to floor.  This happened just since last appt.continues to c/o soreness of right side of leg and buttocks.  Has chronic pain on opiates longstanding.    Anxiety and depression:  Has been on xanax for many years.  Due to falling will try to reduce xanax dose again but keep frequency the same.  In the past, this has not gone well with multiple calls from her daughter.    Past Medical History:  Diagnosis Date  . Abnormality of gait   . Adenomatous polyp of colon 2002   57mm  . Allergic rhinitis   . Anxiety   . Anxiety and depression   . Chronic back pain   . Dementia without behavioral disturbance   . Depression   . Diverticulosis of colon   . Dry eye syndrome   . Dysphagia   . Dysthymic disorder   . Fibromyalgia   . GERD (gastroesophageal reflux disease)   . H/O hiatal hernia   . History of adverse drug reaction   . History of cerebrovascular disease 09/24/2014  . History  of recurrent UTIs   . Hypertension, benign   . Irritable bowel syndrome   . Low back pain syndrome   . Memory loss   . Mitral valve prolapse   . Paroxysmal A-fib ([place])   . Peripheral neuropathy    "both feet and legs"  . Physical deconditioning   . Sjogren's syndrome ([place])   . Therapeutic opioid-induced constipation (OIC)   . Thyroid nodule   . Urinary incontinence     Past Surgical History:  Procedure Laterality Date  . ABDOMINAL HYSTERECTOMY  1967  . APPENDECTOMY    . CARDIAC CATHETERIZATION  02/17/2003   normal L main, LAD free of disease, Cfx free of disease, RCA free of disease (Dr. Loni Muse. [place])  . CATARACT EXTRACTION, BILATERAL    . CHOLECYSTECTOMY  2000  . COLONOSCOPY W/ BIOPSIES     multiple  . DENTAL SURGERY     multiple tooth extractions  . ESOPHAGOGASTRODUODENOSCOPY (EGD) WITH  ESOPHAGEAL DILATION N/A 08/23/2012   Procedure: ESOPHAGOGASTRODUODENOSCOPY (EGD) WITH ESOPHAGEAL DILATION;  Surgeon: Milus Banister, MD;  Location: [facility] ENDOSCOPY;  Service: Endoscopy;  Laterality: N/A;  . NASAL SEPTUM SURGERY  1980  . NM MYOCAR PERF WALL MOTION  2003   persantine - normal static and dynamic study w/apical thinning and presvered LV function, no ischemia  . SINUS EXPLORATION     ossifiying fibroma  . TEMPOROMANDIBULAR JOINT SURGERY  1986   Dr. Terence Lux  . TRANSTHORACIC ECHOCARDIOGRAM  2001   mild LVH, normal LV    Allergies  Allergen Reactions  . Banana Nausea And Vomiting  . Codeine Nausea Only    unless given with Phenergan  . Klonopin [Clonazepam] Other (See Comments)    Causes hallucination   . Meperidine Hcl Nausea Only    unless given with Phenergan  . Norflex [Orphenadrine Citrate] Nausea Only    Unless given with Phenergan  . Oxycodone-Acetaminophen Nausea Only    unless given with phenergan  . Propoxyphene Hcl Nausea Only    unless given with phenergan  . Zoloft [Sertraline Hcl] Other (See Comments)    Caused lethargy  . Doxycycline Other (See Comments)    Unknown  . Naproxen Other (See Comments)  . Penicillins Other (See Comments)    Unknown  . Phenothiazines Other (See Comments)    Unknown  . Stelazine Other (See Comments)    Unknown  . Sulfamethoxazole-Trimethoprim Other (See Comments)    Unknown  . Tolectin [Tolmetin Sodium] Other (See Comments)    Unknown  . Tramadol Other (See Comments)    Unknown    Allergies as of 07/17/2016      Reactions   Banana Nausea And Vomiting   Codeine Nausea Only   unless given with Phenergan   Klonopin [clonazepam] Other (See Comments)   Causes hallucination   Meperidine Hcl Nausea Only   unless given with Phenergan   Norflex [orphenadrine Citrate] Nausea Only   Unless given with Phenergan   Oxycodone-acetaminophen Nausea Only   unless given with phenergan   Propoxyphene Hcl Nausea Only   unless  given with phenergan   Zoloft [sertraline Hcl] Other (See Comments)   Caused lethargy   Doxycycline Other (See Comments)   Unknown   Naproxen Other (See Comments)   Penicillins Other (See Comments)   Unknown   Phenothiazines Other (See Comments)   Unknown   Stelazine Other (See Comments)   Unknown   Sulfamethoxazole-trimethoprim Other (See Comments)   Unknown   Tolectin [tolmetin Sodium] Other (See Comments)  Unknown   Tramadol Other (See Comments)   Unknown      Medication List       Accurate as of 07/17/16 11:59 PM. Always use your most recent med list.          ALPRAZolam 0.25 MG tablet Commonly known as:  XANAX Take 1 tablet (0.25 mg total) by mouth 2 (two) times daily as needed for anxiety. for anxiety   aspirin 325 MG tablet Take 1 tablet (325 mg total) by mouth daily.   bisacodyl 5 MG EC tablet Commonly known as:  DULCOLAX Take 10 mg by mouth daily as needed for moderate constipation.   CARTIA XT 180 MG 24 hr capsule Generic drug:  diltiazem TAKE ONE CAPSULE BY MOUTH ONCE DAILY   DULoxetine 30 MG capsule Commonly known as:  CYMBALTA Take one capsule by mouth once daily for back pain and depression   DULoxetine 60 MG capsule Commonly known as:  CYMBALTA Take one capsule by mouth in the evening for depression   furosemide 40 MG tablet Commonly known as:  LASIX Take 1 tablet (40 mg total) by mouth daily.   HYDROcodone-acetaminophen 5-325 MG tablet Commonly known as:  NORCO/VICODIN Take 1 tablet by mouth every 6 (six) hours as needed for moderate pain.   hydrocortisone 20 MG tablet Commonly known as:  CORTEF Take 20 mg by mouth daily.   hydroxychloroquine 200 MG tablet Commonly known as:  PLAQUENIL 200 mg in the morning and 200 mg in the evening Monday through Friday only. None on Saturday, none on Sunday.   lansoprazole 30 MG capsule Commonly known as:  PREVACID Take 1 capsule (30 mg total) by mouth daily before breakfast.   LINZESS 290 MCG  Caps capsule Generic drug:  linaclotide TAKE ONE CAPSULE BY MOUTH ONCE DAILY BEFORE BREAKFAST   memantine 10 MG tablet Commonly known as:  NAMENDA TAKE ONE TABLET BY MOUTH TWICE DAILY   mirabegron ER 25 MG Tb24 tablet Commonly known as:  MYRBETRIQ Take 1 tablet (25 mg total) by mouth daily.   nystatin 100000 UNIT/ML suspension Commonly known as:  MYCOSTATIN Take 5 mLs (500,000 Units total) by mouth 4 (four) times daily.   pilocarpine 5 MG tablet Commonly known as:  SALAGEN Take 1 tablet (5 mg total) by mouth 2 (two) times daily.   potassium chloride SA 20 MEQ tablet Commonly known as:  K-DUR,KLOR-CON Take 1 tablet (20 mEq total) by mouth daily.   pregabalin 75 MG capsule Commonly known as:  LYRICA Take 1 capsule (75 mg total) by mouth 2 (two) times daily. For pain   REFRESH TEARS 0.5 % Soln Generic drug:  carboxymethylcellulose Place 1 drop into both eyes 2 (two) times daily as needed (dry eyes).   senna 8.6 MG Tabs tablet Commonly known as:  SENOKOT Take 2 tablets (17.2 mg total) by mouth daily.   sodium chloride 0.65 % Soln nasal spray Commonly known as:  OCEAN Place 1 spray into the nose as needed for congestion.       Review of Systems:  Review of Systems  Constitutional: Positive for malaise/fatigue. Negative for chills, fever and weight loss.  HENT: Positive for hearing loss.        Dry mouth  Eyes:       Dry eyes  Respiratory: Negative for cough and shortness of breath.   Cardiovascular: Positive for leg swelling. Negative for chest pain, palpitations, orthopnea, claudication and PND.  Gastrointestinal: Positive for constipation. Negative for abdominal pain, blood in  stool, diarrhea, melena, nausea and vomiting.       Distended abdomen  Genitourinary: Positive for dysuria. Negative for flank pain, frequency, hematuria and urgency.       Chronic dysuria  Musculoskeletal: Positive for falls, joint pain and myalgias.       Right knee has been buckling on  her  Skin: Negative for itching and rash.  Neurological: Positive for weakness. Negative for dizziness and loss of consciousness.  Endo/Heme/Allergies: Bruises/bleeds easily.  Psychiatric/Behavioral: Positive for depression and memory loss. Negative for suicidal ideas. The patient is nervous/anxious. The patient does not have insomnia.     Health Maintenance  Topic Date Due  . DEXA SCAN  07/04/1996  . INFLUENZA VACCINE  09/13/2016  . MAMMOGRAM  02/03/2017  . TETANUS/TDAP  12/09/2023  . PNA vac Low Risk Adult  Completed    Physical Exam: Vitals:   07/17/16 1455  BP: 110/60  Pulse: 74  Temp: 97.5 F (36.4 C)  TempSrc: Oral  SpO2: 95%  Weight: 160 lb (72.6 kg)   Body mass index is 27.46 kg/m. Physical Exam  Constitutional: She is oriented to person, place, and time. She appears well-nourished. No distress.  HENT:  Head: Normocephalic and atraumatic.  Not wearing lower dentures  Cardiovascular:  irreg irreg  Pulmonary/Chest: Effort normal and breath sounds normal. No respiratory distress. She has no rales.  Abdominal: Bowel sounds are normal. She exhibits distension. She exhibits no mass. There is no tenderness. There is no guarding.  Musculoskeletal: Normal range of motion.  Walks with rollator walker  Neurological: She is alert and oriented to person, place, and time.  Skin: Skin is warm and dry. Capillary refill takes less than 2 seconds. There is pallor.  Psychiatric: She has a normal mood and affect.    Labs reviewed: Basic Metabolic Panel:  Recent Labs  12/13/15 1120 04/04/16 1536  NA 140 140  K 4.4 4.3  CL 102 103  CO2 28 29  GLUCOSE 115* 89  BUN 12 21  CREATININE 0.70 0.76  CALCIUM 10.0 9.9  TSH 1.83  --    Liver Function Tests:  Recent Labs  12/13/15 1120 04/04/16 1536  AST 22 21  ALT 17 16  ALKPHOS 82 86  BILITOT 0.5 0.4  PROT 7.3 7.3  ALBUMIN 4.3 4.3   No results for input(s): LIPASE, AMYLASE in the last 8760 hours. No results for  input(s): AMMONIA in the last 8760 hours. CBC:  Recent Labs  12/13/15 1120 04/04/16 1536  WBC 4.5 5.7  NEUTROABS 2,475 2,907  HGB 13.8 13.0  HCT 40.6 40.0  MCV 90.4 92.0  PLT 229 234   Lipid Panel:  Recent Labs  12/13/15 1120  CHOL 189  HDL 53  LDLCALC 113  TRIG 116  CHOLHDL 3.6   Lab Results  Component Value Date   HGBA1C 6.0 (H) 12/13/2015    Assessment/Plan 1. Chronic diastolic heart failure (HCC) -seems she has trended up gradually with fluid retention -not clear about this steroid use on med list--her daughter is checking with rheum but they don't think she's been taking it since feb as it appears on med list -will treat with lasix 40mg  daily and potassium 8meq daily -caregiver to keep an eye that she is not getting dizzy, dehydrated  -to take first thing in am so caregiver is there to help her to the restroom numerous times -suspect this will only be needed short term and then can be prn weight gain -I have her  coming back in 1 month to see me  2. Vascular dementia without behavioral disturbance -chronic, progressive, poor historian and this is compounded by her bipolar disorder -is on namenda -continues to live at home with caregiver during the day, but alone at hs -cannot afford ALF and has been resistant to SNF placement--family has not pushed the issue -has recurrent falls which are multifactorial--trying again to taper some offending medications, but hard with her chronic pain and bipolar  3. Paroxysmal atrial fibrillation (HCC) -cont full dose asa as anticoagulant due to frequent falls and cartia XT for rate control -no recent RVR  4. High risk medication use -discussed each visit how she is on several high risk meds in her age group -will continue to attempt tapers to try to decrease falls:  Steroid if on, norco, xanax, pilocarpine could be affecting her psyche also but needed for her sjogren's which she complains about nonstop anyway (shown as  expired in med list) -cymbalta also on list, but did seem to help her considerably so would not change it   5. ANXIETY DEPRESSION -cont cymbalta and decrease xanax dose for next refill to try to help reduce falls  6. Recurrent falls while walking -multifactorial:  Meds, progressive dementia, u incontinence, knee OA, etc. -will try to work on meds portion, cont caregiver services  Labs/tests ordered:  No orders of the defined types were placed in this encounter.   Next appt: 1 month f/u on chf   Malinda Mayden L. Sadey Yandell, D.O. [facility] [address], [place] [address] Cell Phone (Mon-Fri 8am-5pm):  [phone] On Call:  [phone] & follow prompts after 5pm & weekends Office Phone:  [phone] Office Fax:  [phone]

## 2016-07-17 NOTE — Patient Instructions (Addendum)
DASH Eating Plan DASH stands for "Dietary Approaches to Stop Hypertension." The DASH eating plan is a healthy eating plan that has been shown to reduce high blood pressure (hypertension). It may also reduce your risk for type 2 diabetes, heart disease, and stroke. The DASH eating plan may also help with weight loss. What are tips for following this plan? General guidelines  Avoid eating more than 2,300 mg (milligrams) of salt (sodium) a day. If you have hypertension, you may need to reduce your sodium intake to 1,500 mg a day.  Limit alcohol intake to no more than 1 drink a day for nonpregnant women and 2 drinks a day for men. One drink equals 12 oz of beer, 5 oz of wine, or 1 oz of hard liquor.  Work with your health care provider to maintain a healthy body weight or to lose weight. Ask what an ideal weight is for you.  Get at least 30 minutes of exercise that causes your heart to beat faster (aerobic exercise) most days of the week. Activities may include walking, swimming, or biking.  Work with your health care provider or diet and nutrition specialist (dietitian) to adjust your eating plan to your individual calorie needs. Reading food labels  Check food labels for the amount of sodium per serving. Choose foods with less than 5 percent of the Daily Value of sodium. Generally, foods with less than 300 mg of sodium per serving fit into this eating plan.  To find whole grains, look for the word "whole" as the first word in the ingredient list. Shopping  Buy products labeled as "low-sodium" or "no salt added."  Buy fresh foods. Avoid canned foods and premade or frozen meals. Cooking  Avoid adding salt when cooking. Use salt-free seasonings or herbs instead of table salt or sea salt. Check with your health care provider or pharmacist before using salt substitutes.  Do not fry foods. Cook foods using healthy methods such as baking, boiling, grilling, and broiling instead.  Cook with  heart-healthy oils, such as olive, canola, soybean, or sunflower oil. Meal planning   Eat a balanced diet that includes: ? 5 or more servings of fruits and vegetables each day. At each meal, try to fill half of your plate with fruits and vegetables. ? Up to 6-8 servings of whole grains each day. ? Less than 6 oz of lean meat, poultry, or fish each day. A 3-oz serving of meat is about the same size as a deck of cards. One egg equals 1 oz. ? 2 servings of low-fat dairy each day. ? A serving of nuts, seeds, or beans 5 times each week. ? Heart-healthy fats. Healthy fats called Omega-3 fatty acids are found in foods such as flaxseeds and coldwater fish, like sardines, salmon, and mackerel.  Limit how much you eat of the following: ? Canned or prepackaged foods. ? Food that is high in trans fat, such as fried foods. ? Food that is high in saturated fat, such as fatty meat. ? Sweets, desserts, sugary drinks, and other foods with added sugar. ? Full-fat dairy products.  Do not salt foods before eating.  Try to eat at least 2 vegetarian meals each week.  Eat more home-cooked food and less restaurant, buffet, and fast food.  When eating at a restaurant, ask that your food be prepared with less salt or no salt, if possible. What foods are recommended? The items listed may not be a complete list. Talk with your dietitian about what   dietary choices are best for you. Grains Whole-grain or whole-wheat bread. Whole-grain or whole-wheat pasta. Brown rice. Oatmeal. Quinoa. Bulgur. Whole-grain and low-sodium cereals. Pita bread. Low-fat, low-sodium crackers. Whole-wheat flour tortillas. Vegetables Fresh or frozen vegetables (raw, steamed, roasted, or grilled). Low-sodium or reduced-sodium tomato and vegetable juice. Low-sodium or reduced-sodium tomato sauce and tomato paste. Low-sodium or reduced-sodium canned vegetables. Fruits All fresh, dried, or frozen fruit. Canned fruit in natural juice (without  added sugar). Meat and other protein foods Skinless chicken or turkey. Ground chicken or turkey. Pork with fat trimmed off. Fish and seafood. Egg whites. Dried beans, peas, or lentils. Unsalted nuts, nut butters, and seeds. Unsalted canned beans. Lean cuts of beef with fat trimmed off. Low-sodium, lean deli meat. Dairy Low-fat (1%) or fat-free (skim) milk. Fat-free, low-fat, or reduced-fat cheeses. Nonfat, low-sodium ricotta or cottage cheese. Low-fat or nonfat yogurt. Low-fat, low-sodium cheese. Fats and oils Soft margarine without trans fats. Vegetable oil. Low-fat, reduced-fat, or light mayonnaise and salad dressings (reduced-sodium). Canola, safflower, olive, soybean, and sunflower oils. Avocado. Seasoning and other foods Herbs. Spices. Seasoning mixes without salt. Unsalted popcorn and pretzels. Fat-free sweets. What foods are not recommended? The items listed may not be a complete list. Talk with your dietitian about what dietary choices are best for you. Grains Baked goods made with fat, such as croissants, muffins, or some breads. Dry pasta or rice meal packs. Vegetables Creamed or fried vegetables. Vegetables in a cheese sauce. Regular canned vegetables (not low-sodium or reduced-sodium). Regular canned tomato sauce and paste (not low-sodium or reduced-sodium). Regular tomato and vegetable juice (not low-sodium or reduced-sodium). Pickles. Olives. Fruits Canned fruit in a light or heavy syrup. Fried fruit. Fruit in cream or butter sauce. Meat and other protein foods Fatty cuts of meat. Ribs. Fried meat. Bacon. Sausage. Bologna and other processed lunch meats. Salami. Fatback. Hotdogs. Bratwurst. Salted nuts and seeds. Canned beans with added salt. Canned or smoked fish. Whole eggs or egg yolks. Chicken or turkey with skin. Dairy Whole or 2% milk, cream, and half-and-half. Whole or full-fat cream cheese. Whole-fat or sweetened yogurt. Full-fat cheese. Nondairy creamers. Whipped toppings.  Processed cheese and cheese spreads. Fats and oils Butter. Stick margarine. Lard. Shortening. Ghee. Bacon fat. Tropical oils, such as coconut, palm kernel, or palm oil. Seasoning and other foods Salted popcorn and pretzels. Onion salt, garlic salt, seasoned salt, table salt, and sea salt. Worcestershire sauce. Tartar sauce. Barbecue sauce. Teriyaki sauce. Soy sauce, including reduced-sodium. Steak sauce. Canned and packaged gravies. Fish sauce. Oyster sauce. Cocktail sauce. Horseradish that you find on the shelf. Ketchup. Mustard. Meat flavorings and tenderizers. Bouillon cubes. Hot sauce and Tabasco sauce. Premade or packaged marinades. Premade or packaged taco seasonings. Relishes. Regular salad dressings. Where to find more information:  National Heart, Lung, and Blood Institute: www.nhlbi.nih.gov  American Heart Association: www.heart.org Summary  The DASH eating plan is a healthy eating plan that has been shown to reduce high blood pressure (hypertension). It may also reduce your risk for type 2 diabetes, heart disease, and stroke.  With the DASH eating plan, you should limit salt (sodium) intake to 2,300 mg a day. If you have hypertension, you may need to reduce your sodium intake to 1,500 mg a day.  When on the DASH eating plan, aim to eat more fresh fruits and vegetables, whole grains, lean proteins, low-fat dairy, and heart-healthy fats.  Work with your health care provider or diet and nutrition specialist (dietitian) to adjust your eating plan to your individual   calorie needs. This information is not intended to replace advice given to you by your health care provider. Make sure you discuss any questions you have with your health care provider. Document Released: 01/19/2011 Document Revised: 01/24/2016 Document Reviewed: 01/24/2016 Elsevier Interactive Patient Education  2017 [place].  Take lasix 40mg  po daily.   Take potassium chloride 41meq po daily.

## 2016-07-20 ENCOUNTER — Other Ambulatory Visit: Payer: Self-pay | Admitting: Internal Medicine

## 2016-07-22 ENCOUNTER — Other Ambulatory Visit: Payer: Self-pay | Admitting: Nurse Practitioner

## 2016-07-25 ENCOUNTER — Ambulatory Visit: Payer: Medicare Other | Admitting: Neurology

## 2016-08-04 ENCOUNTER — Other Ambulatory Visit: Payer: Self-pay | Admitting: Rheumatology

## 2016-08-04 NOTE — Telephone Encounter (Signed)
Last Visit: 04/04/16 Next Visit: 08/31/16  Okay to refill Pilocarpine?

## 2016-08-04 NOTE — Telephone Encounter (Signed)
ok 

## 2016-08-11 ENCOUNTER — Other Ambulatory Visit: Payer: Self-pay | Admitting: *Deleted

## 2016-08-11 ENCOUNTER — Other Ambulatory Visit: Payer: Self-pay | Admitting: Internal Medicine

## 2016-08-11 DIAGNOSIS — F341 Dysthymic disorder: Secondary | ICD-10-CM

## 2016-08-11 MED ORDER — DULOXETINE HCL 30 MG PO CPEP: ORAL_CAPSULE | ORAL | 1 refills | Status: DC

## 2016-08-11 NOTE — Telephone Encounter (Signed)
Patient daughter, Myana requested. Faxed.

## 2016-08-11 NOTE — Telephone Encounter (Signed)
rx sent to pharmacy by e-script  

## 2016-08-12 ENCOUNTER — Other Ambulatory Visit: Payer: Self-pay | Admitting: Internal Medicine

## 2016-08-14 NOTE — Telephone Encounter (Signed)
Lyrica called into pharmacy #[id number].

## 2016-08-17 ENCOUNTER — Ambulatory Visit (INDEPENDENT_AMBULATORY_CARE_PROVIDER_SITE_OTHER): Payer: Medicare Other | Admitting: Internal Medicine

## 2016-08-17 ENCOUNTER — Encounter: Payer: Self-pay | Admitting: Internal Medicine

## 2016-08-17 VITALS — BP 139/72 | HR 72 | Ht 64.0 in | Wt 159.2 lb

## 2016-08-17 DIAGNOSIS — I480 Paroxysmal atrial fibrillation: Secondary | ICD-10-CM

## 2016-08-17 DIAGNOSIS — F0391 Unspecified dementia with behavioral disturbance: Secondary | ICD-10-CM | POA: Diagnosis not present

## 2016-08-17 DIAGNOSIS — R609 Edema, unspecified: Secondary | ICD-10-CM

## 2016-08-17 DIAGNOSIS — F03918 Unspecified dementia, unspecified severity, with other behavioral disturbance: Secondary | ICD-10-CM

## 2016-08-17 DIAGNOSIS — I10 Essential (primary) hypertension: Secondary | ICD-10-CM

## 2016-08-17 NOTE — Patient Instructions (Signed)
Your physician wants you to follow-up in: 6 months with Dr. Hilty. You will receive a reminder letter in the mail two months in advance. If you don't receive a letter, please call our office to schedule the follow-up appointment.    

## 2016-08-17 NOTE — Progress Notes (Signed)
OFFICE NOTE  Chief Complaint:  Leg swelling  Primary Care Physician: Gayland Curry, DO  HPI:  Ariana [name] an 83 year old female with history of some confusion in the past, hypertension and Sjogren syndrome. I had added lisinopril to her regimen which has helped control her blood pressure much better. She does report good control over her blood pressure over the past year; however, has had recent worsening problems such as cough, new productive sputum, problems with urinary tract infections. Weight is up back to 126 pounds after dip to 116 pounds but this is about where she is normally with a good appetite. Blood pressure is noted to be mildly elevated today; however, she is in some discomfort and has been short of breath with productive cough. Denies any chest pain. She does have pain in her back that shoots to her right thigh when sitting or leaning down. Today she has a number of other complaints. Her main issue is palpitations which she reports she had an episode of fast heart rate for almost one month but it has somewhat resolved. She still gets some recurrent palpitations.  I saw Ariana [name] back in the office today. She was recently seen by [name] and according to her notes his had marked progression of her dementia. She's now having significant behavioral disturbance as well as wandering. This is cause significant stress for her daughter and family. Compliance with medications is poor. There have been falls at home. Today in the office she is noted to be in new onset atrial fibrillation with rapid ventricular response. Heart rate is in the 120s. I had a long discussion with her daughter and her to the extent that she could understand about atrial fibrillation and her increased risk for stroke. Her CHADSVASC score is elevated at 4. She's also complaining of some abdominal discomfort but no significant chest pain. Blood pressure was low today at 90/70.  Unfortunately Ariana [name] returns  today for follow-up. She was recently hospitalized after an episode of transient global amnesia. She was ultimately found to have stroke with 2 clear punctate lesions on imaging. Is felt to be cardioembolic and may be related to her atrial fibrillation. She continues to have falls and in fact the fall was her initial presentation. As we discussed at length before she is at very high risk for complications on increased anticoagulation such as warfarin or a direct oral anticoagulant. Despite her CHADSVASC score now of 6, it is felt that anticoagulation is too risky. She has had an increase in her aspirin from 81 mg to 325 mg.  Ariana [name] returns today for follow-up. She continues to have significant behavioral issues related to dementia and possibly bipolar type symptoms. From a cardiac standpoint, her main complaints continue to be lower extremity swelling. She's not been fitted with compression stockings as it's been too difficult for her daughter to get her to [place]. She is requesting treatment for the edema although Mrs. Brogdon reports that she does have significant incontinence. She continues to live at home and get some home assistance, but that is winding down after her recent stroke. She had another fall last week and I still feel is not a good candidate for anticoagulation beyond aspirin.  08/16/2015  I saw Ariana [name] back today in the office. Unfortunately she was recently admitted for UTI and was found to be bradycardic at the time. Beta blocker was discontinued although she remains on diltiazem. She was placed on low-dose losartan for  additional blood pressure control. Since then she's had some labile blood pressures including some blood pressures in the 90s and low 195 systolic which is cause her to be dizzy and feel fatigued. Mention or some degree of autonomic dysfunction.  08/17/2016  Ariana [name] returns today for follow-up. Recently she's had some leg swelling and some weight gain. She was  put on Lasix by her primary care provider of her weight is fairly stable and not come down much. Daughter reported that she's been using boost on the weekends at least 3 or 4 times a day in addition to whatever she is eating. It could be that this is weight gain mostly related to increase calories. Blood pressure is actually been less labile recently. She is in an ectopic atrial rhythm today.  PMHx:  Past Medical History:  Diagnosis Date  . Abnormality of gait   . Adenomatous polyp of colon 2002   50mm  . Allergic rhinitis   . Anxiety   . Anxiety and depression   . Chronic back pain   . Dementia without behavioral disturbance   . Depression   . Diverticulosis of colon   . Dry eye syndrome   . Dysphagia   . Dysthymic disorder   . Fibromyalgia   . GERD (gastroesophageal reflux disease)   . H/O hiatal hernia   . History of adverse drug reaction   . History of cerebrovascular disease 09/24/2014  . History of recurrent UTIs   . Hypertension, benign   . Irritable bowel syndrome   . Low back pain syndrome   . Memory loss   . Mitral valve prolapse   . Paroxysmal A-fib ([place])   . Peripheral neuropathy    "both feet and legs"  . Physical deconditioning   . Sjogren's syndrome ([place])   . Therapeutic opioid-induced constipation (OIC)   . Thyroid nodule   . Urinary incontinence     Past Surgical History:  Procedure Laterality Date  . ABDOMINAL HYSTERECTOMY  1967  . APPENDECTOMY    . CARDIAC CATHETERIZATION  02/17/2003   normal L main, LAD free of disease, Cfx free of disease, RCA free of disease (Dr. Loni Muse. Little)  . CATARACT EXTRACTION, BILATERAL    . CHOLECYSTECTOMY  2000  . COLONOSCOPY W/ BIOPSIES     multiple  . DENTAL SURGERY     multiple tooth extractions  . ESOPHAGOGASTRODUODENOSCOPY (EGD) WITH ESOPHAGEAL DILATION N/A 08/23/2012   Procedure: ESOPHAGOGASTRODUODENOSCOPY (EGD) WITH ESOPHAGEAL DILATION;  Surgeon: Milus Banister, MD;  Location: [facility];  Service: Endoscopy;   Laterality: N/A;  . NASAL SEPTUM SURGERY  1980  . NM MYOCAR PERF WALL MOTION  2003   persantine - normal static and dynamic study w/apical thinning and presvered LV function, no ischemia  . SINUS EXPLORATION     ossifiying fibroma  . TEMPOROMANDIBULAR JOINT SURGERY  1986   Dr. Terence Lux  . TRANSTHORACIC ECHOCARDIOGRAM  2001   mild LVH, normal LV    FAMHx:  Family History  Problem Relation Age of Onset  . Heart disease Father        heart attack  . Pneumonia Father   . Heart attack Mother   . Hypertension Mother   . Colon cancer Sister   . Kidney disease Daughter   . Asthma Daughter   . Arthritis Daughter 46       osteo,  . Heart disease Son 22       stage 3 CHF(Diastolic /Systolic)  . Throat cancer Brother   .  Hypertension Maternal Grandmother     SOCHx:   reports that she has quit smoking. She has never used smokeless tobacco. She reports that she does not drink alcohol or use drugs.  ALLERGIES:  Allergies  Allergen Reactions  . Banana Nausea And Vomiting  . Codeine Nausea Only    unless given with Phenergan  . Klonopin [Clonazepam] Other (See Comments)    Causes hallucination   . Meperidine Hcl Nausea Only    unless given with Phenergan  . Norflex [Orphenadrine Citrate] Nausea Only    Unless given with Phenergan  . Oxycodone-Acetaminophen Nausea Only    unless given with phenergan  . Propoxyphene Hcl Nausea Only    unless given with phenergan  . Zoloft [Sertraline Hcl] Other (See Comments)    Caused lethargy  . Doxycycline Other (See Comments)    Unknown  . Naproxen Other (See Comments)  . Penicillins Other (See Comments)    Unknown  . Phenothiazines Other (See Comments)    Unknown  . Stelazine Other (See Comments)    Unknown  . Sulfamethoxazole-Trimethoprim Other (See Comments)    Unknown  . Tolectin [Tolmetin Sodium] Other (See Comments)    Unknown  . Tramadol Other (See Comments)    Unknown    ROS: Pertinent items noted in HPI and remainder  of comprehensive ROS otherwise negative.  HOME MEDS: Current Outpatient Prescriptions  Medication Sig Dispense Refill  . ALPRAZolam (XANAX) 0.5 MG tablet TAKE 1 TABLET BY MOUTH TWICE DAILY AS NEEDED FOR ANXIETY 60 tablet 0  . aspirin 325 MG tablet Take 1 tablet (325 mg total) by mouth daily. 30 tablet 2  . bisacodyl (DULCOLAX) 5 MG EC tablet Take 10 mg by mouth daily as needed for moderate constipation.    . carboxymethylcellulose (REFRESH TEARS) 0.5 % SOLN Place 1 drop into both eyes 2 (two) times daily as needed (dry eyes).     . CARTIA XT 180 MG 24 hr capsule TAKE ONE CAPSULE BY MOUTH ONCE DAILY 90 capsule 3  . DULoxetine (CYMBALTA) 30 MG capsule Take one capsule by mouth once daily for back pain and depression 90 capsule 1  . DULoxetine (CYMBALTA) 60 MG capsule Take one capsule by mouth in the evening for depression 90 capsule 3  . furosemide (LASIX) 40 MG tablet Take 1 tablet (40 mg total) by mouth daily. 30 tablet 3  . HYDROcodone-acetaminophen (NORCO/VICODIN) 5-325 MG tablet Take 1 tablet by mouth every 6 (six) hours as needed for moderate pain. 120 tablet 0  . hydroxychloroquine (PLAQUENIL) 200 MG tablet 200 mg in the morning and 200 mg in the evening Monday through Friday only. None on Saturday, none on Sunday. 135 tablet 1  . lansoprazole (PREVACID) 30 MG capsule Take 1 capsule (30 mg total) by mouth daily before breakfast. 90 capsule 3  . LINZESS 290 MCG CAPS capsule TAKE ONE CAPSULE BY MOUTH ONCE DAILY BEFORE BREAKFAST 30 capsule 3  . LYRICA 75 MG capsule TAKE ONE CAPSULE BY MOUTH TWICE DAILY AS NEEDED FOR PAIN 60 capsule 5  . memantine (NAMENDA) 10 MG tablet TAKE ONE TABLET BY MOUTH TWICE DAILY 180 tablet 3  . mirabegron ER (MYRBETRIQ) 25 MG TB24 tablet Take 1 tablet (25 mg total) by mouth daily. 90 tablet 1  . nystatin (MYCOSTATIN) 100000 UNIT/ML suspension Take 5 mLs (500,000 Units total) by mouth 4 (four) times daily. 473 mL 3  . pilocarpine (SALAGEN) 5 MG tablet TAKE 1 TABLET BY  MOUTH TWICE DAILY 180 tablet 0  .  potassium chloride SA (K-DUR,KLOR-CON) 20 MEQ tablet Take 1 tablet (20 mEq total) by mouth daily. 30 tablet 3  . senna (SENOKOT) 8.6 MG TABS tablet Take 2 tablets (17.2 mg total) by mouth daily. 120 each 0  . sodium chloride (OCEAN) 0.65 % SOLN nasal spray Place 1 spray into the nose as needed for congestion. 30 mL 0   No current facility-administered medications for this visit.     LABS/IMAGING: No results found for this or any previous visit (from the past 48 hour(s)). No results found.  VITALS: BP 139/72   Pulse 72   Ht 5\' 4"  (1.626 m)   Wt 159 lb 3.2 oz (72.2 kg)   BMI 27.33 kg/m   EXAM: General appearance: alert and no distress Neck: no carotid bruit and no JVD Lungs: clear to auscultation bilaterally Heart: regular rate and rhythm, S1, S2 normal, no murmur, click, rub or gallop Abdomen: soft, non-tender; bowel sounds normal; no masses,  no organomegaly Extremities: extremities normal, atraumatic, no cyanosis or edema Pulses: 2+ and symmetric Skin: Skin color, texture, turgor normal. No rashes or lesions Neurologic: Grossly normal Psych: Pleasant  EKG: Ectopic atrial rhythm at 73, incomplete right bundle branch block, nonspecific T wave changes-personally reviewed  ASSESSMENT: 1. New-onset paroxysmal atrial fibrillation with rapid ventricular response - CHADSVASC 6 (not on warfarin due to falls) 2. Recent stroke 3. Labile hypertension - allowing higher BP than normal 4. Chest wall pain 5. Palpitations 6. Sjogren syndrome 7. Progressive dementia with behavioral disturbance 8. Falls and medication noncompliance 9. Asymmetric left lower extremity edema 10.   PLAN: 1.   Ms. Seiple seems to be and an ectopic atrial rhythm today and has had sinus rhythm and therefore has PAF. She is asymptomatic with this. She is on aspirin due to falls and she's had 3 falls over the last year. Blood pressure is fairly stable today but has been labile  in the past. She was recently put on some diuretic for lower extremity edema and has had weight gain however weight is not significantly improved on that dose and her swelling though is nondetectable today. Follow-up 6 months.  Pixie Casino, MD, [facility] Attending Cardiologist [place] [facility] Nyhla Mountjoy 08/17/2016, 4:38 PM

## 2016-08-22 ENCOUNTER — Other Ambulatory Visit: Payer: Self-pay | Admitting: Internal Medicine

## 2016-08-24 ENCOUNTER — Other Ambulatory Visit: Payer: Self-pay | Admitting: *Deleted

## 2016-08-24 MED ORDER — HYDROCODONE-ACETAMINOPHEN 5-325 MG PO TABS: 1.0000 | ORAL_TABLET | Freq: Four times a day (QID) | ORAL | 0 refills | Status: DC | PRN

## 2016-08-24 NOTE — Telephone Encounter (Signed)
Patient daughter requested and will pick up 

## 2016-08-28 ENCOUNTER — Telehealth: Payer: Self-pay | Admitting: Internal Medicine

## 2016-08-28 NOTE — Telephone Encounter (Signed)
I spoke with the patients daughter and apologized for the delay in reaching out to her about the community resource referral.  She stated that her mom has had 2 falls within the past week, which is a concern.  They are no longer receiving home health services from [facility] ([facility] didnt feel as if the patient was benefiting from the services).  Ariana [name] is her preferred [place].  She has been working with [employer] who provided her with information for [employer], but [name] doesnt feel this program will be beneficial.  When I asked her about a nursing facility, she stated that she never wanted to send her mom there, but she is now considering it due to her own health issues (spinal problems), resulting from the stress of caring for both households.  She feels that if her mom does end up going into a facility, she would probably be more comfortable at [facility] since she was there before for rehab.  Mrs. Slaughters concern is that [employer] have a memory care unit.  She has questions about whether that would be a requirement.  She wants to discuss SNF placement with [name] at upcoming appointment with her mother.  However, she feels it will be best to introduce the conversation by talking about the recent falls.  I explained that I would pass along the information to [name] and follow up with her after Thursdays appointment to determine the best way to proceed. VDM (DD)

## 2016-08-29 NOTE — Telephone Encounter (Signed)
Thank you [facility].  We have spoken about it many times.  I will again make my recommendation for SNF care since she is unable to care for herself on her own, has incontinence, recurrent falls, a complicated medication regimen, unable to afford AL level of care, needs help with ADLs except feeding herself due to weakness, dementia.

## 2016-08-31 ENCOUNTER — Ambulatory Visit (INDEPENDENT_AMBULATORY_CARE_PROVIDER_SITE_OTHER): Payer: Medicare Other | Admitting: Internal Medicine

## 2016-08-31 ENCOUNTER — Encounter: Payer: Self-pay | Admitting: Internal Medicine

## 2016-08-31 VITALS — BP 120/70 | HR 83 | Temp 97.7°F | Wt 162.0 lb

## 2016-08-31 DIAGNOSIS — I5032 Chronic diastolic (congestive) heart failure: Secondary | ICD-10-CM | POA: Diagnosis not present

## 2016-08-31 DIAGNOSIS — R296 Repeated falls: Secondary | ICD-10-CM | POA: Diagnosis not present

## 2016-08-31 DIAGNOSIS — Z79899 Other long term (current) drug therapy: Secondary | ICD-10-CM

## 2016-08-31 DIAGNOSIS — F341 Dysthymic disorder: Secondary | ICD-10-CM | POA: Diagnosis not present

## 2016-08-31 DIAGNOSIS — F0150 Vascular dementia without behavioral disturbance: Secondary | ICD-10-CM | POA: Diagnosis not present

## 2016-08-31 DIAGNOSIS — E876 Hypokalemia: Secondary | ICD-10-CM

## 2016-08-31 DIAGNOSIS — G6289 Other specified polyneuropathies: Secondary | ICD-10-CM

## 2016-08-31 MED ORDER — POTASSIUM CHLORIDE 20 MEQ PO PACK: 20.0000 meq | PACK | Freq: Every day | ORAL | 3 refills | Status: DC

## 2016-08-31 MED ORDER — ALPRAZOLAM 0.25 MG PO TABS: 0.2500 mg | ORAL_TABLET | Freq: Two times a day (BID) | ORAL | 0 refills | Status: DC | PRN

## 2016-08-31 NOTE — Progress Notes (Addendum)
Location:  [facility] clinic Provider:  Jamira Barfuss L. Mariea Clonts, D.O., C.M.D.  Code Status: Full code Goals of Care:  Advanced Directives 08/31/2016  Does Patient Have a Medical Advance Directive? Yes  Type of Advance Directive [place]  Does patient want to make changes to medical advance directive? -  Copy of [place] in Chart? Yes  Would patient like information on creating a medical advance directive? -  Pre-existing out of facility DNR order (yellow form or pink MOST form) -   Chief Complaint  Patient presents with  . Medical Management of Chronic Issues    62mth follow-up    HPI: Patient is a 83 y.o. female seen today for medical management of chronic diseases.    I had started her on lasix and potassium.  Weight not dramatically changed and still has distention of her belly.  She started having diarrhea until this week.   Weight 162 lbs today.    She's back having a bunch of itching and breaking out on her back and under her bra.    She's had two more falls.  Neuropathy in feet said to cause Sunday episode.  She crawled from one room to the other and laid on a pillow.  She can't get up on her own.  Last Thursday, she slid off the couch onto the floor.    She's having burning when she voids.  She drinks fluids with caregiver, but probably not enough the rest of the time.    Fortunately, her daughter spoke with a pharmacist who also recommended we reduce the xanax as I planned to last time.  Her current caregiver is there 9-5 Mon thru Friday.  Pt seems to fall when she is alone.  she is unable to care for herself on her own, has incontinence, recurrent falls, a complicated medication regimen, unable to afford AL level of care, needs help with ADLs except feeding herself due to weakness, dementia.   Past Medical History:  Diagnosis Date  . Abnormality of gait   . Adenomatous polyp of colon 2002   53mm  . Allergic rhinitis   . Anxiety   .  Anxiety and depression   . Chronic back pain   . Dementia without behavioral disturbance   . Depression   . Diverticulosis of colon   . Dry eye syndrome   . Dysphagia   . Dysthymic disorder   . Fibromyalgia   . GERD (gastroesophageal reflux disease)   . H/O hiatal hernia   . History of adverse drug reaction   . History of cerebrovascular disease 09/24/2014  . History of recurrent UTIs   . Hypertension, benign   . Irritable bowel syndrome   . Low back pain syndrome   . Memory loss   . Mitral valve prolapse   . Paroxysmal A-fib ([place])   . Peripheral neuropathy    "both feet and legs"  . Physical deconditioning   . Sjogren's syndrome ([place])   . Therapeutic opioid-induced constipation (OIC)   . Thyroid nodule   . Urinary incontinence     Past Surgical History:  Procedure Laterality Date  . ABDOMINAL HYSTERECTOMY  1967  . APPENDECTOMY    . CARDIAC CATHETERIZATION  02/17/2003   normal L main, LAD free of disease, Cfx free of disease, RCA free of disease (Dr. Loni Muse. [facility])  . CATARACT EXTRACTION, BILATERAL    . CHOLECYSTECTOMY  2000  . COLONOSCOPY W/ BIOPSIES     multiple  .  DENTAL SURGERY     multiple tooth extractions  . ESOPHAGOGASTRODUODENOSCOPY (EGD) WITH ESOPHAGEAL DILATION N/A 08/23/2012   Procedure: ESOPHAGOGASTRODUODENOSCOPY (EGD) WITH ESOPHAGEAL DILATION;  Surgeon: Milus Banister, MD;  Location: [facility];  Service: Endoscopy;  Laterality: N/A;  . NASAL SEPTUM SURGERY  1980  . NM MYOCAR PERF WALL MOTION  2003   persantine - normal static and dynamic study w/apical thinning and presvered LV function, no ischemia  . SINUS EXPLORATION     ossifiying fibroma  . TEMPOROMANDIBULAR JOINT SURGERY  1986   Dr. Terence Lux  . TRANSTHORACIC ECHOCARDIOGRAM  2001   mild LVH, normal LV    Allergies  Allergen Reactions  . Banana Nausea And Vomiting  . Codeine Nausea Only    unless given with Phenergan  . Klonopin [Clonazepam] Other (See Comments)    Causes hallucination   .  Meperidine Hcl Nausea Only    unless given with Phenergan  . Norflex [Orphenadrine Citrate] Nausea Only    Unless given with Phenergan  . Oxycodone-Acetaminophen Nausea Only    unless given with phenergan  . Propoxyphene Hcl Nausea Only    unless given with phenergan  . Zoloft [Sertraline Hcl] Other (See Comments)    Caused lethargy  . Doxycycline Other (See Comments)    Unknown  . Naproxen Other (See Comments)  . Penicillins Other (See Comments)    Unknown  . Phenothiazines Other (See Comments)    Unknown  . Stelazine Other (See Comments)    Unknown  . Sulfamethoxazole-Trimethoprim Other (See Comments)    Unknown  . Tolectin [Tolmetin Sodium] Other (See Comments)    Unknown  . Tramadol Other (See Comments)    Unknown    Allergies as of 08/31/2016      Reactions   Banana Nausea And Vomiting   Codeine Nausea Only   unless given with Phenergan   Klonopin [clonazepam] Other (See Comments)   Causes hallucination   Meperidine Hcl Nausea Only   unless given with Phenergan   Norflex [orphenadrine Citrate] Nausea Only   Unless given with Phenergan   Oxycodone-acetaminophen Nausea Only   unless given with phenergan   Propoxyphene Hcl Nausea Only   unless given with phenergan   Zoloft [sertraline Hcl] Other (See Comments)   Caused lethargy   Doxycycline Other (See Comments)   Unknown   Naproxen Other (See Comments)   Penicillins Other (See Comments)   Unknown   Phenothiazines Other (See Comments)   Unknown   Stelazine Other (See Comments)   Unknown   Sulfamethoxazole-trimethoprim Other (See Comments)   Unknown   Tolectin [tolmetin Sodium] Other (See Comments)   Unknown   Tramadol Other (See Comments)   Unknown      Medication List       Accurate as of 08/31/16  3:00 PM. Always use your most recent med list.          ALPRAZolam 0.5 MG tablet Commonly known as:  XANAX TAKE 1 TABLET BY MOUTH TWICE DAILY AS NEEDED FOR ANXIETY   aspirin 325 MG tablet Take 1  tablet (325 mg total) by mouth daily.   bisacodyl 5 MG EC tablet Commonly known as:  DULCOLAX Take 10 mg by mouth daily as needed for moderate constipation.   CARTIA XT 180 MG 24 hr capsule Generic drug:  diltiazem TAKE ONE CAPSULE BY MOUTH ONCE DAILY   DULoxetine 60 MG capsule Commonly known as:  CYMBALTA Take one capsule by mouth in the evening for depression  DULoxetine 30 MG capsule Commonly known as:  CYMBALTA Take one capsule by mouth once daily for back pain and depression   furosemide 40 MG tablet Commonly known as:  LASIX Take 1 tablet (40 mg total) by mouth daily.   HYDROcodone-acetaminophen 5-325 MG tablet Commonly known as:  NORCO/VICODIN Take 1 tablet by mouth every 6 (six) hours as needed for moderate pain.   hydroxychloroquine 200 MG tablet Commonly known as:  PLAQUENIL 200 mg in the morning and 200 mg in the evening Monday through Friday only. None on Saturday, none on Sunday.   lansoprazole 30 MG capsule Commonly known as:  PREVACID Take 1 capsule (30 mg total) by mouth daily before breakfast.   LINZESS 290 MCG Caps capsule Generic drug:  linaclotide TAKE ONE CAPSULE BY MOUTH ONCE DAILY BEFORE BREAKFAST   LYRICA 75 MG capsule Generic drug:  pregabalin TAKE ONE CAPSULE BY MOUTH TWICE DAILY AS NEEDED FOR PAIN   memantine 10 MG tablet Commonly known as:  NAMENDA TAKE ONE TABLET BY MOUTH TWICE DAILY   mirabegron ER 25 MG Tb24 tablet Commonly known as:  MYRBETRIQ Take 1 tablet (25 mg total) by mouth daily.   nystatin 100000 UNIT/ML suspension Commonly known as:  MYCOSTATIN Take 5 mLs (500,000 Units total) by mouth 4 (four) times daily.   pilocarpine 5 MG tablet Commonly known as:  SALAGEN TAKE 1 TABLET BY MOUTH TWICE DAILY   potassium chloride SA 20 MEQ tablet Commonly known as:  K-DUR,KLOR-CON Take 1 tablet (20 mEq total) by mouth daily.   REFRESH TEARS 0.5 % Soln Generic drug:  carboxymethylcellulose Place 1 drop into both eyes 2 (two)  times daily as needed (dry eyes).   senna 8.6 MG Tabs tablet Commonly known as:  SENOKOT Take 2 tablets (17.2 mg total) by mouth daily.   sodium chloride 0.65 % Soln nasal spray Commonly known as:  OCEAN Place 1 spray into the nose as needed for congestion.       Review of Systems:  Review of Systems  Constitutional: Positive for malaise/fatigue. Negative for chills, fever and weight loss.  HENT: Positive for hearing loss. Negative for congestion.   Eyes: Positive for blurred vision.  Respiratory: Negative for cough and shortness of breath.   Cardiovascular: Negative for chest pain, palpitations and leg swelling.       Edema better  Gastrointestinal: Positive for constipation. Negative for abdominal pain, blood in stool, diarrhea and melena.  Genitourinary: Positive for frequency and urgency. Negative for dysuria.       Improved with myrbetriq  Musculoskeletal: Positive for back pain, falls and joint pain.  Skin: Positive for itching and rash.       Now resolved  Neurological: Positive for tingling, sensory change and weakness. Negative for dizziness and loss of consciousness.  Psychiatric/Behavioral: Positive for depression and memory loss. The patient is nervous/anxious. The patient does not have insomnia.     Health Maintenance  Topic Date Due  . DEXA SCAN  07/04/1996  . INFLUENZA VACCINE  09/13/2016  . MAMMOGRAM  02/03/2017  . TETANUS/TDAP  12/09/2023  . PNA vac Low Risk Adult  Completed    Physical Exam: Vitals:   08/31/16 1455  BP: 120/70  Pulse: 83  Temp: 97.7 F (36.5 C)  TempSrc: Oral  SpO2: 93%  Weight: 162 lb (73.5 kg)   Body mass index is 27.81 kg/m. Physical Exam  Constitutional: She appears well-developed and well-nourished. No distress.  Cardiovascular: Normal heart sounds and intact distal pulses.  irreg irreg  Pulmonary/Chest: Effort normal and breath sounds normal. No respiratory distress.  Abdominal: Soft. Bowel sounds are normal. She  exhibits no distension. There is no tenderness.  Musculoskeletal: Normal range of motion.  Walks with rollator walker  Neurological: She is alert.  Skin: Skin is warm and dry.  Psychiatric: She has a normal mood and affect.    Labs reviewed: Basic Metabolic Panel:  Recent Labs  12/13/15 1120 04/04/16 1536  NA 140 140  K 4.4 4.3  CL 102 103  CO2 28 29  GLUCOSE 115* 89  BUN 12 21  CREATININE 0.70 0.76  CALCIUM 10.0 9.9  TSH 1.83  --    Liver Function Tests:  Recent Labs  12/13/15 1120 04/04/16 1536  AST 22 21  ALT 17 16  ALKPHOS 82 86  BILITOT 0.5 0.4  PROT 7.3 7.3  ALBUMIN 4.3 4.3   No results for input(s): LIPASE, AMYLASE in the last 8760 hours. No results for input(s): AMMONIA in the last 8760 hours. CBC:  Recent Labs  12/13/15 1120 04/04/16 1536  WBC 4.5 5.7  NEUTROABS 2,475 2,907  HGB 13.8 13.0  HCT 40.6 40.0  MCV 90.4 92.0  PLT 229 234   Lipid Panel:  Recent Labs  12/13/15 1120  CHOL 189  HDL 53  LDLCALC 113  TRIG 116  CHOLHDL 3.6   Lab Results  Component Value Date   HGBA1C 6.0 (H) 12/13/2015   Assessment/Plan 1. Other polyneuropathy -discussed that I would increase lyrica when I get her off some of the xanax and hydrocodone as I've tried several times -will reduce xanax dose again--pt not as aware of medications and changes now as daughter and caregiver take care of these now   2. Vascular dementia without behavioral disturbance -progressing gradually -cont namenda -b/w this and physical debility, really needs to move to a SNF  3. Chronic diastolic heart failure (HCC) -cont lasix and potassium--change to liquid version (packets to mix)  4. High risk medication use -xanax, hydrocodone, pilocarpine  5. ANXIETY DEPRESSION -reduce xanax dose  -cont cymbalta 90mg  daily for now (would favor reducing to 60mg )  6. Recurrent falls while walking -multifactorial including polypharmacy, peripheral neuropathy, spinal stenosis,  arthritis, incontinence, etc. -working on polypharmacy now -needs SNF placement  Labs/tests ordered: No orders of the defined types were placed in this encounter.  Next appt:  3 mos med mgt  Evany Schecter L. Abbas Beyene, D.O. [facility] [address], [place] [address] Cell Phone (Mon-Fri 8am-5pm):  [phone] On Call:  [phone] & follow prompts after 5pm & weekends Office Phone:  [phone] Office Fax:  [phone]

## 2016-08-31 NOTE — Patient Instructions (Addendum)
I do recommend moving to a skilled nursing facility to help with care on a regular basis.  Falls seem to happen when your caregiver is gone.    Drink plenty of water each day 6-8 8oz glasses of water.    We'll decrease xanax to 0.25mg  twice a day as needed for anxiety.

## 2016-09-06 DIAGNOSIS — Z01419 Encounter for gynecological examination (general) (routine) without abnormal findings: Secondary | ICD-10-CM | POA: Diagnosis not present

## 2016-09-06 DIAGNOSIS — Z1272 Encounter for screening for malignant neoplasm of vagina: Secondary | ICD-10-CM | POA: Diagnosis not present

## 2016-09-06 DIAGNOSIS — Z6829 Body mass index (BMI) 29.0-29.9, adult: Secondary | ICD-10-CM | POA: Diagnosis not present

## 2016-09-08 ENCOUNTER — Other Ambulatory Visit: Payer: Self-pay | Admitting: Internal Medicine

## 2016-09-19 ENCOUNTER — Other Ambulatory Visit: Payer: Self-pay | Admitting: *Deleted

## 2016-09-19 MED ORDER — ALPRAZOLAM 0.25 MG PO TABS: 0.2500 mg | ORAL_TABLET | Freq: Two times a day (BID) | ORAL | 0 refills | Status: DC | PRN

## 2016-09-19 NOTE — Telephone Encounter (Signed)
Patient daughter Aliveah requested. Dosage decreased.

## 2016-09-27 DIAGNOSIS — Z79899 Other long term (current) drug therapy: Secondary | ICD-10-CM | POA: Diagnosis not present

## 2016-09-27 DIAGNOSIS — H04123 Dry eye syndrome of bilateral lacrimal glands: Secondary | ICD-10-CM | POA: Diagnosis not present

## 2016-10-04 ENCOUNTER — Other Ambulatory Visit: Payer: Self-pay | Admitting: Rheumatology

## 2016-10-04 ENCOUNTER — Other Ambulatory Visit: Payer: Self-pay | Admitting: Internal Medicine

## 2016-10-05 NOTE — Telephone Encounter (Addendum)
Last Visit: 04/04/16 Next Visit: is due in August 2018. Message sent to the front to schedule patient. Labs: 04/04/16 CBC/CMP WNL PLQ Eye Exam: 2015  Left message for patient to advise we need updated PLQ eye exam and to schedule and appointment.

## 2016-10-06 ENCOUNTER — Ambulatory Visit (HOSPITAL_COMMUNITY)
Admission: EM | Admit: 2016-10-06 | Discharge: 2016-10-06 | Disposition: A | Discharge status: Home / Self Care | Payer: Medicare Other | Attending: Internal Medicine | Admitting: Internal Medicine

## 2016-10-06 ENCOUNTER — Telehealth: Payer: Self-pay | Admitting: *Deleted

## 2016-10-06 ENCOUNTER — Encounter (HOSPITAL_COMMUNITY): Payer: Self-pay | Admitting: *Deleted

## 2016-10-06 DIAGNOSIS — N179 Acute kidney failure, unspecified: Secondary | ICD-10-CM | POA: Insufficient documentation

## 2016-10-06 DIAGNOSIS — Z8673 Personal history of transient ischemic attack (TIA), and cerebral infarction without residual deficits: Secondary | ICD-10-CM | POA: Insufficient documentation

## 2016-10-06 DIAGNOSIS — M3500 Sicca syndrome, unspecified: Secondary | ICD-10-CM | POA: Insufficient documentation

## 2016-10-06 DIAGNOSIS — G609 Hereditary and idiopathic neuropathy, unspecified: Secondary | ICD-10-CM | POA: Diagnosis not present

## 2016-10-06 DIAGNOSIS — I480 Paroxysmal atrial fibrillation: Secondary | ICD-10-CM | POA: Insufficient documentation

## 2016-10-06 DIAGNOSIS — R197 Diarrhea, unspecified: Secondary | ICD-10-CM | POA: Diagnosis present

## 2016-10-06 DIAGNOSIS — Z880 Allergy status to penicillin: Secondary | ICD-10-CM | POA: Insufficient documentation

## 2016-10-06 DIAGNOSIS — K591 Functional diarrhea: Secondary | ICD-10-CM | POA: Insufficient documentation

## 2016-10-06 DIAGNOSIS — R269 Unspecified abnormalities of gait and mobility: Secondary | ICD-10-CM | POA: Insufficient documentation

## 2016-10-06 DIAGNOSIS — M545 Low back pain: Secondary | ICD-10-CM | POA: Insufficient documentation

## 2016-10-06 DIAGNOSIS — I959 Hypotension, unspecified: Secondary | ICD-10-CM | POA: Diagnosis not present

## 2016-10-06 DIAGNOSIS — R1084 Generalized abdominal pain: Secondary | ICD-10-CM | POA: Insufficient documentation

## 2016-10-06 DIAGNOSIS — N390 Urinary tract infection, site not specified: Secondary | ICD-10-CM | POA: Diagnosis not present

## 2016-10-06 DIAGNOSIS — K219 Gastro-esophageal reflux disease without esophagitis: Secondary | ICD-10-CM | POA: Diagnosis not present

## 2016-10-06 DIAGNOSIS — R300 Dysuria: Secondary | ICD-10-CM

## 2016-10-06 DIAGNOSIS — E7800 Pure hypercholesterolemia, unspecified: Secondary | ICD-10-CM | POA: Insufficient documentation

## 2016-10-06 DIAGNOSIS — F0391 Unspecified dementia with behavioral disturbance: Secondary | ICD-10-CM | POA: Diagnosis not present

## 2016-10-06 DIAGNOSIS — G8929 Other chronic pain: Secondary | ICD-10-CM | POA: Insufficient documentation

## 2016-10-06 DIAGNOSIS — M797 Fibromyalgia: Secondary | ICD-10-CM | POA: Diagnosis not present

## 2016-10-06 DIAGNOSIS — I10 Essential (primary) hypertension: Secondary | ICD-10-CM | POA: Diagnosis not present

## 2016-10-06 LAB — POCT URINALYSIS DIP (DEVICE)
Bilirubin Urine: NEGATIVE
Glucose, UA: NEGATIVE mg/dL
Hgb urine dipstick: NEGATIVE
Ketones, ur: NEGATIVE mg/dL
Nitrite: NEGATIVE
Protein, ur: NEGATIVE mg/dL
Specific Gravity, Urine: 1.02 (ref 1.005–1.030)
Urobilinogen, UA: 0.2 mg/dL (ref 0.0–1.0)
pH: 5 (ref 5.0–8.0)

## 2016-10-06 MED ORDER — CEPHALEXIN 250 MG PO CAPS: 250.0000 mg | ORAL_CAPSULE | Freq: Three times a day (TID) | ORAL | 0 refills | Status: DC

## 2016-10-06 NOTE — ED Triage Notes (Signed)
Patient reports diarrhea yesterday and today, patient was on laxative family stopped this.   Patient just started potassium pills.   Patient reports dysuria and dark urine.   Patient with chronic abdominal pain and GI issues per caretaker.

## 2016-10-06 NOTE — Telephone Encounter (Signed)
Patient's daughter, Aalayah Riles returning Andrea's call.

## 2016-10-06 NOTE — Discharge Instructions (Signed)
Take the medication as directed. Continue to drink plenty of fluids. Do not take anymore laxatives. Call your doctor on Monday as needed. If you are getting worse with new symptoms, worsening pain, vomiting, fever or other problems go to the emergency department, otherwise follow-up with your primary care provider.

## 2016-10-06 NOTE — Telephone Encounter (Signed)
Patient has been scheduled for 11/08/16.  Patient had PLQ eye exam on 09/27/16 WNL, Will have copy sent to office.   Okay to refill 30 day supply per Dr. Estanislado Pandy

## 2016-10-06 NOTE — ED Provider Notes (Signed)
[place]    CSN: [id number] Arrival date & time: 10/06/16  1321     History   Chief Complaint Chief Complaint  Patient presents with  . Dysuria  . Diarrhea    HPI Ariana [name] is a 83 y.o. female.   83 year old female complaining of dysuria and itching for 2-3 days associated with dark colored urine. She is accompanied by her caretaker. There also in [place] about loose stools. She has a history of several different abdominal complaints and chronic abdominal pain and including IBS, constipation and diarrhea. Because she has had constipation more recently she had been administered laxatives. The last dose was Tuesday. The concern was that on Wednesday she had 3 loose stools, no loose stools yesterday and one loose stool today. No fever or chills. No back pain. She has an extensive past medical history and current problem list includes the chronic abdominal pain, chronic back pain, paroxysmal atrial fib, IBS, dementia, polypharmacy, cerebrovascular disease, overactive bladder among others.       Past Medical History:  Diagnosis Date  . Abnormality of gait   . Adenomatous polyp of colon 2002   67mm  . Allergic rhinitis   . Anxiety   . Anxiety and depression   . Chronic back pain   . Dementia without behavioral disturbance   . Depression   . Diverticulosis of colon   . Dry eye syndrome   . Dysphagia   . Dysthymic disorder   . Fibromyalgia   . GERD (gastroesophageal reflux disease)   . H/O hiatal hernia   . History of adverse drug reaction   . History of cerebrovascular disease 09/24/2014  . History of recurrent UTIs   . Hypertension, benign   . Irritable bowel syndrome   . Low back pain syndrome   . Memory loss   . Mitral valve prolapse   . Paroxysmal A-fib ([place])   . Peripheral neuropathy    "both feet and legs"  . Physical deconditioning   . Sjogren's syndrome ([name])   . Therapeutic opioid-induced constipation (OIC)   . Thyroid nodule   . Urinary  incontinence     Patient Active Problem List   Diagnosis Date Noted  . Sjogren's disease ([place]) 04/03/2016  . Primary osteoarthritis of both hands 04/03/2016  . High risk medication use 04/03/2016  . Senile dementia, with behavioral disturbance 08/16/2015  . Swelling 08/16/2015  . Diastolic dysfunction 41/66/0630  . Hypertonicity, bladder 07/08/2015  . Arterial hypotension   . Bradycardia 06/29/2015  . Chronic lower back pain 06/29/2015  . PAF (paroxysmal atrial fibrillation) ([place]) 04/18/2015  . Dehydration 04/18/2015  . Dementia without behavioral disturbance 04/18/2015  . TIA (transient ischemic attack) 12/17/2014  . Chest pain 12/16/2014  . Acute encephalopathy 12/04/2014  . Fall   . AKI (acute kidney injury) ([place]) 12/03/2014  . History of cerebrovascular disease 09/24/2014  . Falls frequently 07/28/2014  . Infarction of parietal lobe ([place])   . Mild dementia   . CVA (cerebral infarction) 07/26/2014  . Constipation 04/15/2014  . Mixed stress and urge urinary incontinence 11/03/2013  . Therapeutic opioid induced constipation 11/03/2013  . Hemorrhoid 11/03/2013  . Low back pain associated with a spinal disorder other than radiculopathy or spinal stenosis 11/03/2013  . Protein-calorie malnutrition, severe ([place]) 07/22/2013  . UTI (lower urinary tract infection) 07/20/2013  . Prolonged QT interval 07/20/2013  . Osteopenia 07/17/2013  . Palpitations 06/30/2013  . Hereditary and idiopathic peripheral neuropathy 05/22/2013  . Abnormality of gait  12/05/2012  . Headache(784.0) 09/05/2012  . Multinodular thyroid 01/15/2012  . Neck pain 01/15/2012  . Hypercholesterolemia 07/08/2010  . Tear film insufficiency 07/06/2009  . [place] SYNDROME 07/06/2009  . Urge urinary incontinence 01/06/2009  . COLONIC POLYPS, ADENOMATOUS, HX OF 04/15/2008  . MITRAL VALVE PROLAPSE 11/05/2007  . ANXIETY DEPRESSION 07/02/2007  . Mononeuritis 07/02/2007  . HYPERTENSION, BENIGN 07/02/2007  . GERD  07/02/2007  . Irritable bowel syndrome 07/02/2007  . Fibromyalgia 07/02/2007    Past Surgical History:  Procedure Laterality Date  . ABDOMINAL HYSTERECTOMY  1967  . APPENDECTOMY    . CARDIAC CATHETERIZATION  02/17/2003   normal L main, LAD free of disease, Cfx free of disease, RCA free of disease (Dr. Loni Muse. [place])  . CATARACT EXTRACTION, BILATERAL    . CHOLECYSTECTOMY  2000  . COLONOSCOPY W/ BIOPSIES     multiple  . DENTAL SURGERY     multiple tooth extractions  . ESOPHAGOGASTRODUODENOSCOPY (EGD) WITH ESOPHAGEAL DILATION N/A 08/23/2012   Procedure: ESOPHAGOGASTRODUODENOSCOPY (EGD) WITH ESOPHAGEAL DILATION;  Surgeon: Milus Banister, MD;  Location: [facility] ENDOSCOPY;  Service: Endoscopy;  Laterality: N/A;  . NASAL SEPTUM SURGERY  1980  . NM MYOCAR PERF WALL MOTION  2003   persantine - normal static and dynamic study w/apical thinning and presvered LV function, no ischemia  . SINUS EXPLORATION     ossifiying fibroma  . TEMPOROMANDIBULAR JOINT SURGERY  1986   Dr. Terence Lux  . TRANSTHORACIC ECHOCARDIOGRAM  2001   mild LVH, normal LV    OB History    No data available       Home Medications    Prior to Admission medications   Medication Sig Start Date End Date Taking? Authorizing Provider  ALPRAZolam (XANAX) 0.25 MG tablet Take 1 tablet (0.25 mg total) by mouth 2 (two) times daily as needed for anxiety. 09/19/16   Lauree Chandler, NP  aspirin 325 MG tablet Take 1 tablet (325 mg total) by mouth daily. 07/28/14   Reyne Dumas, MD  bisacodyl (DULCOLAX) 5 MG EC tablet Take 10 mg by mouth daily as needed for moderate constipation.    [provider]  carboxymethylcellulose (REFRESH TEARS) 0.5 % SOLN Place 1 drop into both eyes 2 (two) times daily as needed (dry eyes).     [provider]  CARTIA XT 180 MG 24 hr capsule TAKE ONE CAPSULE BY MOUTH ONCE DAILY 01/12/16   Hilty, Nadean Corwin, MD  cephALEXin (KEFLEX) 250 MG capsule Take 1 capsule (250 mg total) by mouth 3 (three)  times daily. 10/06/16   Ariana Napoleon, NP  DULoxetine (CYMBALTA) 30 MG capsule Take one capsule by mouth once daily for back pain and depression 08/11/16   Lauree Chandler, NP  DULoxetine (CYMBALTA) 60 MG capsule Take one capsule by mouth in the evening for depression 04/10/16   Reed, Tiffany L, DO  furosemide (LASIX) 40 MG tablet Take 1 tablet (40 mg total) by mouth daily. 07/17/16   Reed, Tiffany L, DO  HYDROcodone-acetaminophen (NORCO/VICODIN) 5-325 MG tablet Take 1 tablet by mouth every 6 (six) hours as needed for moderate pain. 08/24/16   Lauree Chandler, NP  hydroxychloroquine (PLAQUENIL) 200 MG tablet 200 mg in the morning and 200 mg in the evening Monday through Friday only. None on Saturday, none on Sunday. 04/04/16   Panwala, Naitik, PA-C  lansoprazole (PREVACID) 30 MG capsule Take 1 capsule (30 mg total) by mouth daily before breakfast. 12/13/15   Mariea Clonts, Tiffany L, DO  LINZESS 290  MCG CAPS capsule TAKE 1 CAPSULE BY MOUTH ONCE DAILY BEFORE BREAKFAST 09/08/16   Gatha Mayer, MD  LYRICA 75 MG capsule TAKE ONE CAPSULE BY MOUTH TWICE DAILY AS NEEDED FOR PAIN 08/14/16   Reed, Tiffany L, DO  memantine (NAMENDA) 10 MG tablet TAKE ONE TABLET BY MOUTH TWICE DAILY 05/30/16   Reed, Tiffany L, DO  MYRBETRIQ 25 MG TB24 tablet TAKE ONE TABLET BY MOUTH ONCE DAILY 10/04/16   Reed, Tiffany L, DO  nystatin (MYCOSTATIN) 100000 UNIT/ML suspension Take 5 mLs (500,000 Units total) by mouth 4 (four) times daily. 04/10/16   Reed, Tiffany L, DO  pilocarpine (SALAGEN) 5 MG tablet TAKE 1 TABLET BY MOUTH TWICE DAILY 08/04/16   Bo Merino, MD  potassium chloride (KLOR-CON) 20 MEQ packet Take 20 mEq by mouth daily. 08/31/16   Reed, Tiffany L, DO  senna (SENOKOT) 8.6 MG TABS tablet Take 2 tablets (17.2 mg total) by mouth daily. 07/01/15   Debbe Odea, MD  sodium chloride (OCEAN) 0.65 % SOLN nasal spray Place 1 spray into the nose as needed for congestion. 08/23/12   Hongalgi, Lenis Dickinson, MD    Family History Family History   Problem Relation Age of Onset  . Heart disease Father        heart attack  . Pneumonia Father   . Heart attack Mother   . Hypertension Mother   . Colon cancer Sister   . Kidney disease Daughter   . Asthma Daughter   . Arthritis Daughter 33       osteo,  . Heart disease Son 83       stage 3 CHF(Diastolic /Systolic)  . Throat cancer Brother   . Hypertension Maternal Grandmother     Social History Social History  Substance Use Topics  . Smoking status: Former Research scientist (life sciences)  . Smokeless tobacco: Never Used     Comment: Quit at age 73   . Alcohol use No     Allergies   Banana; Codeine; Klonopin [clonazepam]; Meperidine hcl; Norflex [orphenadrine citrate]; Oxycodone-acetaminophen; Propoxyphene hcl; Zoloft [sertraline hcl]; Doxycycline; Naproxen; Penicillins; Phenothiazines; Stelazine; Sulfamethoxazole-trimethoprim; Tolectin [tolmetin sodium]; and Tramadol   Review of Systems Review of Systems  Constitutional: Negative.  Negative for activity change and fever.  HENT: Negative.   Respiratory: Negative.   Cardiovascular: Negative for chest pain.  Gastrointestinal: Positive for abdominal pain, constipation and diarrhea. Negative for nausea and vomiting.       Chronic  Genitourinary: Positive for dysuria and frequency.  Skin: Negative.   Neurological: Negative.   Psychiatric/Behavioral: Positive for confusion.  All other systems reviewed and are negative.    Physical Exam Triage Vital Signs ED Triage Vitals  Enc Vitals Group     BP 10/06/16 1406 112/76     Pulse Rate 10/06/16 1406 79     Resp 10/06/16 1406 17     Temp --      Temp src --      SpO2 10/06/16 1406 97 %     Weight --      Height --      Head Circumference --      Peak Flow --      Pain Score 10/06/16 1404 6     Pain Loc --      Pain Edu? --      Excl. in [place]? --    No data found.   Updated Vital Signs BP 112/76 (BP Location: Left Arm)   Pulse 79   Resp 17  SpO2 97%   Visual Acuity Right Eye  Distance:   Left Eye Distance:   Bilateral Distance:    Right Eye Near:   Left Eye Near:    Bilateral Near:     Physical Exam  Constitutional: She is oriented to person, place, and time. She appears well-developed and well-nourished. No distress.  HENT:  Head: Normocephalic and atraumatic.  Eyes: EOM are normal.  Neck: Neck supple.  Cardiovascular: Normal rate.   Pulmonary/Chest: Effort normal. No respiratory distress.  Abdominal: She exhibits distension.  Chronic abdominal distention.  Musculoskeletal: She exhibits no edema or deformity.  Able to walk with a wheeled walker  Neurological: She is alert and oriented to person, place, and time. She exhibits normal muscle tone.  Skin: Skin is warm and dry.  Psychiatric: She has a normal mood and affect.  Nursing note and vitals reviewed.    UC Treatments / Results  Labs (all labs ordered are listed, but only abnormal results are displayed) Labs Reviewed  POCT URINALYSIS DIP (DEVICE) - Abnormal; Notable for the following:       Result Value   Leukocytes, UA SMALL (*)    All other components within normal limits  URINE CULTURE    EKG  EKG Interpretation None       Radiology No results found.  Procedures Procedures (including critical care time)  Medications Ordered in UC Medications - No data to display   Initial Impression / Assessment and Plan / UC Course  I have reviewed the triage vital signs and the nursing notes.  Pertinent labs & imaging results that were available during my care of the patient were reviewed by me and considered in my medical decision making (see chart for details). The loose stools likely due to the laxatives that were given the day before. This apparently is getting better as she had no loose stools yesterday and only one today. The abdominal pain she is complaining of his chronic and is currently seeing her PCP and gastroenterologist for this. She is in no distress with this today. She  has a history of frequent UTIs and has been hospitalized. Due to this along with small amount leukocyte in the urine in association with overactive bladder or go ahead and treat with antibiotics. She is to follow-up with PCP next week. She is discharged in a stable condition.    Take the medication as directed. Continue to drink plenty of fluids. Do not take anymore laxatives. Call your doctor on Monday as needed. If you are getting worse with new symptoms, worsening pain, vomiting, fever or other problems go to the emergency department, otherwise follow-up with your primary care provider.   Final Clinical Impressions(s) / UC Diagnoses   Final diagnoses:  Dysuria  Lower urinary tract infectious disease  Generalized abdominal pain  Functional diarrhea    New Prescriptions Discharge Medication List as of 10/06/2016  3:56 PM       Controlled Substance Prescriptions [place] Controlled Substance Registry consulted? Not Applicable   Ariana Napoleon, NP 10/06/16 1616

## 2016-10-06 NOTE — Telephone Encounter (Signed)
Patient daughter, Cinthya called and stated that patient is having burning, itching when she urinates. Urine is dark. Daughter is going to take her to the urgent care to have checked. Patient has also had diarrhea for 3 days.  Agreed to take to Urgent Care to be evaluated.

## 2016-10-09 LAB — URINE CULTURE
Culture: >=100000 — AB
Special Requests: NORMAL

## 2016-10-19 ENCOUNTER — Other Ambulatory Visit: Payer: Self-pay | Admitting: Nurse Practitioner

## 2016-10-19 MED ORDER — HYDROCODONE-ACETAMINOPHEN 5-325 MG PO TABS: 1.0000 | ORAL_TABLET | Freq: Four times a day (QID) | ORAL | 0 refills | Status: DC | PRN

## 2016-10-19 MED ORDER — ALPRAZOLAM 0.25 MG PO TABS: 0.2500 mg | ORAL_TABLET | Freq: Two times a day (BID) | ORAL | 0 refills | Status: DC | PRN

## 2016-10-19 NOTE — Telephone Encounter (Signed)
Patient daughter requested and will pick up [employer].

## 2016-10-27 NOTE — Progress Notes (Addendum)
Office Visit Note  Patient: Ariana [name]             Date of Birth: December 31, 1929           MRN: [id number]             PCP: Gayland Curry, DO Referring: Gayland Curry, DO Visit Date: 11/08/2016 Occupation: @GUAROCC @    Subjective: Pain in right knee Medication Management (has not had field of vision, Dr Prudencio Burly has told her ok to continue plaquenil )   History of Present Illness: Ariana [name] is a 83 y.o. female with history of Sjogren's osteoarthritis and disc disease. She states she's been having increased pain in her right knee joint. She continues to have sicca symptoms. Her daughter reports that she was recently treated for urinary tract infection. She still complains of dysuria. She has a follow-up appointment.  Activities of Daily Living:  Patient reports morning stiffness for 24 hours.   Patient Reports nocturnal pain.  Difficulty dressing/grooming: Reports Difficulty climbing stairs: Reports Difficulty getting out of chair: Reports Difficulty using hands for taps, buttons, cutlery, and/or writing: Reports   Review of Systems  Constitutional: Positive for weight gain. Negative for fatigue, night sweats, weight loss and weakness.  HENT: Positive for mouth dryness. Negative for mouth sores, trouble swallowing, trouble swallowing and nose dryness.   Eyes: Positive for itching and dryness. Negative for pain, redness and visual disturbance.  Respiratory: Negative.  Negative for cough, shortness of breath and difficulty breathing.   Cardiovascular: Negative.  Negative for chest pain, palpitations, hypertension, irregular heartbeat and swelling in legs/feet.  Gastrointestinal: Negative.  Negative for blood in stool, constipation and diarrhea.  Endocrine: Positive for increased urination.  Genitourinary: Positive for painful urination. Negative for vaginal dryness.       Has had trouble with urinary tract infection recently  Musculoskeletal: Positive for arthralgias, joint  pain and morning stiffness. Negative for joint swelling, myalgias, muscle weakness, muscle tenderness and myalgias.  Skin: Negative for color change, rash, hair loss, skin tightness, ulcers and sensitivity to sunlight.       Itching hands and feet   Allergic/Immunologic: Negative for susceptible to infections.  Neurological: Positive for memory loss. Negative for dizziness and night sweats.  Hematological: Negative for bruising/bleeding tendency and swollen glands.       Easy bruising  Psychiatric/Behavioral: Positive for confusion. Negative for depressed mood and sleep disturbance. The patient is not nervous/anxious.     PMFS History:  Patient Active Problem List   Diagnosis Date Noted  . Sjogren's disease ([place]) 04/03/2016  . Primary osteoarthritis of both hands 04/03/2016  . High risk medication use 04/03/2016  . Senile dementia, with behavioral disturbance 08/16/2015  . Swelling 08/16/2015  . Diastolic dysfunction 21/30/8657  . Hypertonicity, bladder 07/08/2015  . Arterial hypotension   . Bradycardia 06/29/2015  . Chronic lower back pain 06/29/2015  . PAF (paroxysmal atrial fibrillation) ([place]) 04/18/2015  . Dehydration 04/18/2015  . Dementia without behavioral disturbance 04/18/2015  . TIA (transient ischemic attack) 12/17/2014  . Chest pain 12/16/2014  . Acute encephalopathy 12/04/2014  . Fall   . AKI (acute kidney injury) ([place]) 12/03/2014  . History of cerebrovascular disease 09/24/2014  . Falls frequently 07/28/2014  . Infarction of parietal lobe ([place])   . Mild dementia   . CVA (cerebral infarction) 07/26/2014  . Constipation 04/15/2014  . Mixed stress and urge urinary incontinence 11/03/2013  . Therapeutic opioid induced constipation 11/03/2013  . Hemorrhoid 11/03/2013  .  Low back pain associated with a spinal disorder other than radiculopathy or spinal stenosis 11/03/2013  . Protein-calorie malnutrition, severe ([place]) 07/22/2013  . UTI (lower urinary tract infection)  07/20/2013  . Prolonged QT interval 07/20/2013  . Osteopenia 07/17/2013  . Palpitations 06/30/2013  . Hereditary and idiopathic peripheral neuropathy 05/22/2013  . Abnormality of gait 12/05/2012  . Headache(784.0) 09/05/2012  . Multinodular thyroid 01/15/2012  . Neck pain 01/15/2012  . Hypercholesterolemia 07/08/2010  . Tear film insufficiency 07/06/2009  . Oil Trough SYNDROME 07/06/2009  . Urge urinary incontinence 01/06/2009  . COLONIC POLYPS, ADENOMATOUS, HX OF 04/15/2008  . MITRAL VALVE PROLAPSE 11/05/2007  . ANXIETY DEPRESSION 07/02/2007  . Mononeuritis 07/02/2007  . HYPERTENSION, BENIGN 07/02/2007  . GERD 07/02/2007  . Irritable bowel syndrome 07/02/2007  . Fibromyalgia 07/02/2007    Past Medical History:  Diagnosis Date  . Abnormality of gait   . Adenomatous polyp of colon 2002   10mm  . Allergic rhinitis   . Anxiety   . Anxiety and depression   . Chronic back pain   . Dementia without behavioral disturbance   . Depression   . Diverticulosis of colon   . Dry eye syndrome   . Dysphagia   . Dysthymic disorder   . Fibromyalgia   . GERD (gastroesophageal reflux disease)   . H/O hiatal hernia   . History of adverse drug reaction   . History of cerebrovascular disease 09/24/2014  . History of recurrent UTIs   . Hypertension, benign   . Irritable bowel syndrome   . Low back pain syndrome   . Memory loss   . Mitral valve prolapse   . Paroxysmal A-fib ([place])   . Peripheral neuropathy    "both feet and legs"  . Physical deconditioning   . Sjogren's syndrome ([place])   . Therapeutic opioid-induced constipation ([facility])   . Thyroid nodule   . Urinary incontinence     Family History  Problem Relation Age of Onset  . Heart disease Father        heart attack  . Pneumonia Father   . Heart attack Mother   . Hypertension Mother   . Colon cancer Sister   . Kidney disease Daughter   . Asthma Daughter   . Arthritis Daughter 18       osteo,  . Heart disease Son 56        stage 3 CHF(Diastolic /Systolic)  . Throat cancer Brother   . Hypertension Maternal Grandmother    Past Surgical History:  Procedure Laterality Date  . ABDOMINAL HYSTERECTOMY  1967  . APPENDECTOMY    . CARDIAC CATHETERIZATION  02/17/2003   normal L main, LAD free of disease, Cfx free of disease, RCA free of disease (Dr. Loni Muse. [place])  . CATARACT EXTRACTION, BILATERAL    . CHOLECYSTECTOMY  2000  . COLONOSCOPY W/ BIOPSIES     multiple  . DENTAL SURGERY     multiple tooth extractions  . ESOPHAGOGASTRODUODENOSCOPY (EGD) WITH ESOPHAGEAL DILATION N/A 08/23/2012   Procedure: ESOPHAGOGASTRODUODENOSCOPY (EGD) WITH ESOPHAGEAL DILATION;  Surgeon: Milus Banister, MD;  Location: [facility];  Service: Endoscopy;  Laterality: N/A;  . NASAL SEPTUM SURGERY  1980  . NM MYOCAR PERF WALL MOTION  2003   persantine - normal static and dynamic study w/apical thinning and presvered LV function, no ischemia  . SINUS EXPLORATION     ossifiying fibroma  . TEMPOROMANDIBULAR JOINT SURGERY  1986   Dr. Terence Lux  . 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    Patient is Widowed.    Patient has 2 children.    Patient is retired.    Former smoker   Alcohol none   Exercise Walk, exercise chair 4 days a week   POA    Walks with cane      Patient drinks about 1-2 cups of hot tea daily.   Patient is right handed.                    Objective: Vital Signs: BP 138/62   Pulse 74 Comment: irregular  Resp 16   Ht 5' 3.5" (1.613 m)   Wt 161 lb (73 kg)   BMI 28.07 kg/m    Physical Exam  Constitutional: She is oriented to person, place, and time. She appears well-developed and well-nourished.  HENT:  Head: Normocephalic and atraumatic.  Eyes: Conjunctivae and EOM are normal.  Neck: Normal range of motion.  Cardiovascular: Normal rate, regular rhythm, normal heart sounds and intact distal  pulses.   Pulmonary/Chest: Effort normal and breath sounds normal.  Abdominal: Soft. Bowel sounds are normal.  Lymphadenopathy:    She has no cervical adenopathy.  Neurological: She is alert and oriented to person, place, and time.  Skin: Skin is warm and dry. Capillary refill takes less than 2 seconds.  Psychiatric: She has a normal mood and affect. Her behavior is normal.  Nursing note and vitals reviewed.    Musculoskeletal Exam: C-spine good range of motion. She has thoracic kyphosis. Limited range of motion of her lumbar spine. Shoulder joints elbow joints wrist joints with good range of motion. Hip joints are good range of motion. She has discomfort with range of motion of her right knee without any warmth swelling or effusion. She has osteoarthritic changes in her feet with hammertoes bilaterally..  CDAI Exam: No CDAI exam completed.    Investigation: No additional findings.PLQ eye exam refused per patient CBC Latest Ref Rng & Units 04/04/2016 12/13/2015 07/01/2015  WBC 3.8 - 10.8 K/uL 5.7 4.5 6.5  Hemoglobin 11.7 - 15.5 g/dL 13.0 13.8 12.5  Hematocrit 35.0 - 45.0 % 40.0 40.6 37.5  Platelets 140 - 400 K/uL 234 229 181   CMP Latest Ref Rng & Units 04/04/2016 12/13/2015 07/08/2015  Glucose 65 - 99 mg/dL 89 115(H) 94  BUN 7 - 25 mg/dL 21 12 22   Creatinine 0.60 - 0.88 mg/dL 0.76 0.70 0.73  Sodium 135 - 146 mmol/L 140 140 139  Potassium 3.5 - 5.3 mmol/L 4.3 4.4 4.6  Chloride 98 - 110 mmol/L 103 102 96  CO2 20 - 31 mmol/L 29 28 28   Calcium 8.6 - 10.4 mg/dL 9.9 10.0 10.2  Total Protein 6.1 - 8.1 g/dL 7.3 7.3 -  Total Bilirubin 0.2 - 1.2 mg/dL 0.4 0.5 -  Alkaline Phos 33 - 130 U/L 86 82 -  AST 10 - 35 U/L 21 22 -  ALT 6 - 29 U/L 16 17 -    Imaging: No results found.  Speciality Comments: No specialty comments available.    Procedures:  No procedures performed Allergies: Banana; Codeine; Klonopin [clonazepam]; Meperidine hcl; Norflex [orphenadrine citrate];  Oxycodone-acetaminophen; Propoxyphene hcl; Zoloft [sertraline hcl]; Doxycycline; Naproxen; Penicillins; Phenothiazines; Stelazine; Sulfamethoxazole-trimethoprim; Tolectin [tolmetin sodium]; and Tramadol   Assessment / Plan:     Visit Diagnoses: Sjogren's syndrome, with unspecified organ involvement ([place]) -  she continues to have sicca symptoms. She's been using pilocarpine which is been helpful. She's uncertain of the Plaquenil is really helping her. According to her daughter it is becoming difficult for her to do field of vision exams. We discussed coming off Plaquenil completely and see if that makes her symptoms any worse. Will check her labs today. She would not need any further labs.  High risk medication use - we will discontinue Plaquenil. She will continue on pilocarpine. Prescription refill for pilocarpine was given. Plan: CBC with Differential/Platelet, COMPLETE METABOLIC PANEL WITH GFR today.   Chronic pain of right knee: Patient had no response to cortisone injection in the past. I discussed knee brace. I offered physical therapy which she declined.   Primary osteoarthritis of both feet: She has discomfort in her bilateral feet she has hammertoes. I have referred her to [facility] for orthotics.   DDD (degenerative disc disease), lumbar: Chronic pain   Osteopenia, unspecified location:: She will continue calcium and vitamin D.  Her other medical problems are listed as follows:   Dysuria: A she will be following up with the PCP.  History of hypertension  History of gastroesophageal reflux (GERD)  History of insomnia  History of UTI  History of atrial fibrillation  History of memory loss  History of hypercholesterolemia  History of TIA (transient ischemic attack)  Hereditary and idiopathic peripheral neuropathy    Orders: Orders Placed This Encounter  Procedures  . CBC with Differential/Platelet  . COMPLETE METABOLIC PANEL WITH GFR   Meds ordered this encounter    Medications  . pilocarpine (SALAGEN) 5 MG tablet    Sig: Take 1 tablet (5 mg total) by mouth 2 (two) times daily.    Dispense:  180 tablet    Refill:  1      Follow-Up Instructions: Return in about 1 year (around 11/08/2017) for Sjogren's, DDD.   Bo Merino, MD  Note - This record has been created using Editor, commissioning.  Chart creation errors have been sought, but may not always  have been located. Such creation errors do not reflect on  the standard of medical care.

## 2016-11-04 ENCOUNTER — Other Ambulatory Visit: Payer: Self-pay | Admitting: Internal Medicine

## 2016-11-08 ENCOUNTER — Ambulatory Visit (INDEPENDENT_AMBULATORY_CARE_PROVIDER_SITE_OTHER): Payer: Medicare Other | Admitting: Rheumatology

## 2016-11-08 ENCOUNTER — Encounter: Payer: Self-pay | Admitting: Rheumatology

## 2016-11-08 VITALS — BP 138/62 | HR 74 | Resp 16 | Ht 63.5 in | Wt 161.0 lb

## 2016-11-08 DIAGNOSIS — Z8673 Personal history of transient ischemic attack (TIA), and cerebral infarction without residual deficits: Secondary | ICD-10-CM

## 2016-11-08 DIAGNOSIS — Z8679 Personal history of other diseases of the circulatory system: Secondary | ICD-10-CM

## 2016-11-08 DIAGNOSIS — M3500 Sicca syndrome, unspecified: Secondary | ICD-10-CM | POA: Diagnosis not present

## 2016-11-08 DIAGNOSIS — Z79899 Other long term (current) drug therapy: Secondary | ICD-10-CM

## 2016-11-08 DIAGNOSIS — M25561 Pain in right knee: Secondary | ICD-10-CM

## 2016-11-08 DIAGNOSIS — Z8719 Personal history of other diseases of the digestive system: Secondary | ICD-10-CM | POA: Diagnosis not present

## 2016-11-08 DIAGNOSIS — M8580 Other specified disorders of bone density and structure, unspecified site: Secondary | ICD-10-CM

## 2016-11-08 DIAGNOSIS — Z87898 Personal history of other specified conditions: Secondary | ICD-10-CM | POA: Diagnosis not present

## 2016-11-08 DIAGNOSIS — G8929 Other chronic pain: Secondary | ICD-10-CM

## 2016-11-08 DIAGNOSIS — G609 Hereditary and idiopathic neuropathy, unspecified: Secondary | ICD-10-CM

## 2016-11-08 DIAGNOSIS — Z8639 Personal history of other endocrine, nutritional and metabolic disease: Secondary | ICD-10-CM | POA: Diagnosis not present

## 2016-11-08 DIAGNOSIS — Z87440 Personal history of urinary (tract) infections: Secondary | ICD-10-CM

## 2016-11-08 DIAGNOSIS — M19071 Primary osteoarthritis, right ankle and foot: Secondary | ICD-10-CM | POA: Diagnosis not present

## 2016-11-08 DIAGNOSIS — M5136 Other intervertebral disc degeneration, lumbar region: Secondary | ICD-10-CM

## 2016-11-08 DIAGNOSIS — M19072 Primary osteoarthritis, left ankle and foot: Secondary | ICD-10-CM

## 2016-11-08 LAB — CBC WITH DIFFERENTIAL/PLATELET
Basophils Absolute: 27 cells/uL (ref 0–200)
Basophils Relative: 0.4 %
EOS ABS: 129 {cells}/uL (ref 15–500)
Eosinophils Relative: 1.9 %
HEMATOCRIT: 41.3 % (ref 35.0–45.0)
HEMOGLOBIN: 13.7 g/dL (ref 11.7–15.5)
LYMPHS ABS: 1992 {cells}/uL (ref 850–3900)
MCH: 29.5 pg (ref 27.0–33.0)
MCHC: 33.2 g/dL (ref 32.0–36.0)
MCV: 89 fL (ref 80.0–100.0)
MPV: 10.6 fL (ref 7.5–12.5)
Monocytes Relative: 10 %
NEUTROS PCT: 58.4 %
Neutro Abs: 3971 cells/uL (ref 1500–7800)
Platelets: 245 10*3/uL (ref 140–400)
RBC: 4.64 10*6/uL (ref 3.80–5.10)
RDW: 12.4 % (ref 11.0–15.0)
TOTAL LYMPHOCYTE: 29.3 %
WBC: 6.8 10*3/uL (ref 3.8–10.8)
WBCMIX: 680 {cells}/uL (ref 200–950)

## 2016-11-08 LAB — COMPLETE METABOLIC PANEL WITH GFR
AG Ratio: 1.5 (calc) (ref 1.0–2.5)
ALBUMIN MSPROF: 4.5 g/dL (ref 3.6–5.1)
ALKALINE PHOSPHATASE (APISO): 96 U/L (ref 33–130)
ALT: 25 U/L (ref 6–29)
AST: 24 U/L (ref 10–35)
BILIRUBIN TOTAL: 0.4 mg/dL (ref 0.2–1.2)
BUN / CREAT RATIO: 21 (calc) (ref 6–22)
BUN: 21 mg/dL (ref 7–25)
CO2: 28 mmol/L (ref 20–32)
CREATININE: 1.02 mg/dL — AB (ref 0.60–0.88)
Calcium: 10.1 mg/dL (ref 8.6–10.4)
Chloride: 102 mmol/L (ref 98–110)
GFR, Est African American: 58 mL/min/{1.73_m2} — ABNORMAL LOW (ref >=60)
GFR, Est Non African American: 50 mL/min/{1.73_m2} — ABNORMAL LOW (ref >=60)
GLOBULIN: 3 g/dL (ref 1.9–3.7)
Glucose, Bld: 171 mg/dL — ABNORMAL HIGH (ref 65–99)
Potassium: 4.2 mmol/L (ref 3.5–5.3)
SODIUM: 138 mmol/L (ref 135–146)
Total Protein: 7.5 g/dL (ref 6.1–8.1)

## 2016-11-08 MED ORDER — PILOCARPINE HCL 5 MG PO TABS: 5.0000 mg | ORAL_TABLET | Freq: Two times a day (BID) | ORAL | 1 refills | Status: DC

## 2016-11-09 NOTE — Progress Notes (Signed)
We stopped her Plaquenil yesterday. She is on no DMARD's now. Please forward her labs to her PCP.

## 2016-11-16 ENCOUNTER — Other Ambulatory Visit: Payer: Self-pay | Admitting: Nurse Practitioner

## 2016-11-17 ENCOUNTER — Other Ambulatory Visit: Payer: Self-pay | Admitting: Nurse Practitioner

## 2016-11-29 ENCOUNTER — Emergency Department (HOSPITAL_COMMUNITY)
Admission: EM | Admit: 2016-11-29 | Discharge: 2016-11-29 | Disposition: A | Discharge status: Home / Self Care | Payer: Medicare Other | Attending: Emergency Medicine | Admitting: Emergency Medicine

## 2016-11-29 ENCOUNTER — Encounter (HOSPITAL_COMMUNITY): Payer: Self-pay

## 2016-11-29 ENCOUNTER — Emergency Department (HOSPITAL_COMMUNITY): Payer: Medicare Other

## 2016-11-29 ENCOUNTER — Telehealth: Payer: Self-pay | Admitting: *Deleted

## 2016-11-29 DIAGNOSIS — Z87891 Personal history of nicotine dependence: Secondary | ICD-10-CM | POA: Diagnosis not present

## 2016-11-29 DIAGNOSIS — R05 Cough: Secondary | ICD-10-CM

## 2016-11-29 DIAGNOSIS — Z8673 Personal history of transient ischemic attack (TIA), and cerebral infarction without residual deficits: Secondary | ICD-10-CM | POA: Diagnosis not present

## 2016-11-29 DIAGNOSIS — Z79899 Other long term (current) drug therapy: Secondary | ICD-10-CM | POA: Diagnosis not present

## 2016-11-29 DIAGNOSIS — R9431 Abnormal electrocardiogram [ECG] [EKG]: Secondary | ICD-10-CM | POA: Diagnosis not present

## 2016-11-29 DIAGNOSIS — F0391 Unspecified dementia with behavioral disturbance: Secondary | ICD-10-CM | POA: Insufficient documentation

## 2016-11-29 DIAGNOSIS — R0789 Other chest pain: Secondary | ICD-10-CM | POA: Insufficient documentation

## 2016-11-29 DIAGNOSIS — I10 Essential (primary) hypertension: Secondary | ICD-10-CM | POA: Insufficient documentation

## 2016-11-29 DIAGNOSIS — R059 Cough, unspecified: Secondary | ICD-10-CM

## 2016-11-29 DIAGNOSIS — J40 Bronchitis, not specified as acute or chronic: Secondary | ICD-10-CM | POA: Diagnosis not present

## 2016-11-29 DIAGNOSIS — Z7982 Long term (current) use of aspirin: Secondary | ICD-10-CM | POA: Insufficient documentation

## 2016-11-29 DIAGNOSIS — R079 Chest pain, unspecified: Secondary | ICD-10-CM | POA: Diagnosis not present

## 2016-11-29 LAB — CBC
HEMATOCRIT: 42.1 % (ref 36.0–46.0)
HEMOGLOBIN: 14 g/dL (ref 12.0–15.0)
MCH: 30.4 pg (ref 26.0–34.0)
MCHC: 33.3 g/dL (ref 30.0–36.0)
MCV: 91.3 fL (ref 78.0–100.0)
Platelets: 246 10*3/uL (ref 150–400)
RBC: 4.61 MIL/uL (ref 3.87–5.11)
RDW: 13.7 % (ref 11.5–15.5)
WBC: 6.9 10*3/uL (ref 4.0–10.5)

## 2016-11-29 LAB — BASIC METABOLIC PANEL
ANION GAP: 10 (ref 5–15)
BUN: 28 mg/dL — ABNORMAL HIGH (ref 6–20)
CHLORIDE: 104 mmol/L (ref 101–111)
CO2: 27 mmol/L (ref 22–32)
Calcium: 10.2 mg/dL (ref 8.9–10.3)
Creatinine, Ser: 0.78 mg/dL (ref 0.44–1.00)
GFR calc Af Amer: >60 mL/min (ref >60)
GLUCOSE: 199 mg/dL — AB (ref 65–99)
POTASSIUM: 4.3 mmol/L (ref 3.5–5.1)
Sodium: 141 mmol/L (ref 135–145)

## 2016-11-29 LAB — BRAIN NATRIURETIC PEPTIDE: B Natriuretic Peptide: 32.1 pg/mL (ref 0.0–100.0)

## 2016-11-29 LAB — POCT I-STAT TROPONIN I: TROPONIN I, POC: 0 ng/mL (ref 0.00–0.08)

## 2016-11-29 MED ORDER — LEVOFLOXACIN 750 MG PO TABS: 750.0000 mg | ORAL_TABLET | Freq: Every day | ORAL | 0 refills | Status: DC

## 2016-11-29 MED ORDER — LEVOFLOXACIN 750 MG PO TABS
750.0000 mg | ORAL_TABLET | Freq: Once | ORAL | Status: AC
  Administered 2016-11-29: 750 mg via ORAL
  Filled 2016-11-29: qty 1

## 2016-11-29 NOTE — ED Triage Notes (Addendum)
Patient c/o a productive cough with white sputum x 1 week. Patient did not want to go see her PCP. Caretaker brought her to the ED. Patient c/o bilaeral rib cage pain and abdominal bloating.

## 2016-11-29 NOTE — Discharge Instructions (Signed)
-  You need to drink at least 6 glasses of water daily for the next several days as her lab work showed that you're mildly dehydrated.

## 2016-11-29 NOTE — ED Provider Notes (Signed)
[facility] DEPT Provider Note   CSN: [id number] Arrival date & time: 11/29/16  1301     History   Chief Complaint Chief Complaint  Patient presents with  . Cough  . Bloated  . Chest Pain    HPI Ariana [name] is a 83 y.o. female.  HPI   83 year old past medical history as below who presents with chest wall pain and cough. The patient reportedly is had a cough for the last week. She reports associated bilateral chest wall pain that is worse with coughing. She was supposed to see her PCP but family was concerned about possible pneumonia and brought her to the ED. She endorses mild shortness of breath during coughing but no shortness of breath at rest. Her chest pain is in her bilateral ribs only after coughing. She also endorses some abdominal bloating and nausea but according to the patient as well as her caregiver, this is been an ongoing issue for several years and has not acutely changed. She denies any worsening of her chronic abdominal pain or vomiting or diarrhea. She's had no fevers. She does have occasional sputum production. Denies any known fevers. No chills or night sweats. No specific alleviating factors.  Past Medical History:  Diagnosis Date  . Abnormality of gait   . Adenomatous polyp of colon 2002   25mm  . Allergic rhinitis   . Anxiety   . Anxiety and depression   . Chronic back pain   . Dementia without behavioral disturbance   . Depression   . Diverticulosis of colon   . Dry eye syndrome   . Dysphagia   . Dysthymic disorder   . Fibromyalgia   . GERD (gastroesophageal reflux disease)   . H/O hiatal hernia   . History of adverse drug reaction   . History of cerebrovascular disease 09/24/2014  . History of recurrent UTIs   . Hypertension, benign   . Irritable bowel syndrome   . Low back pain syndrome   . Memory loss   . Mitral valve prolapse   . Paroxysmal A-fib ([place])   . Peripheral neuropathy    "both feet and legs"    . Physical deconditioning   . Sjogren's syndrome ([place])   . Therapeutic opioid-induced constipation (OIC)   . Thyroid nodule   . Urinary incontinence     Patient Active Problem List   Diagnosis Date Noted  . Sjogren's disease ([place]) 04/03/2016  . Primary osteoarthritis of both hands 04/03/2016  . High risk medication use 04/03/2016  . Senile dementia, with behavioral disturbance 08/16/2015  . Swelling 08/16/2015  . Diastolic dysfunction 81/44/8185  . Hypertonicity, bladder 07/08/2015  . Arterial hypotension   . Bradycardia 06/29/2015  . Chronic lower back pain 06/29/2015  . PAF (paroxysmal atrial fibrillation) ([place]) 04/18/2015  . Dehydration 04/18/2015  . Dementia without behavioral disturbance 04/18/2015  . TIA (transient ischemic attack) 12/17/2014  . Chest pain 12/16/2014  . Acute encephalopathy 12/04/2014  . Fall   . AKI (acute kidney injury) ([place]) 12/03/2014  . History of cerebrovascular disease 09/24/2014  . Falls frequently 07/28/2014  . Infarction of parietal lobe   . Mild dementia   . CVA (cerebral infarction) 07/26/2014  . Constipation 04/15/2014  . Mixed stress and urge urinary incontinence 11/03/2013  . Therapeutic opioid induced constipation 11/03/2013  . Hemorrhoid 11/03/2013  . Low back pain associated with a spinal disorder other than radiculopathy or spinal stenosis 11/03/2013  . Protein-calorie malnutrition, severe ([place]) 07/22/2013  .  UTI (lower urinary tract infection) 07/20/2013  . Prolonged QT interval 07/20/2013  . Osteopenia 07/17/2013  . Palpitations 06/30/2013  . Hereditary and idiopathic peripheral neuropathy 05/22/2013  . Abnormality of gait 12/05/2012  . Headache(784.0) 09/05/2012  . Multinodular thyroid 01/15/2012  . Neck pain 01/15/2012  . Hypercholesterolemia 07/08/2010  . Tear film insufficiency 07/06/2009  . [place] SYNDROME 07/06/2009  . Urge urinary incontinence 01/06/2009  . COLONIC POLYPS, ADENOMATOUS, HX OF 04/15/2008  .  MITRAL VALVE PROLAPSE 11/05/2007  . ANXIETY DEPRESSION 07/02/2007  . Mononeuritis 07/02/2007  . HYPERTENSION, BENIGN 07/02/2007  . GERD 07/02/2007  . Irritable bowel syndrome 07/02/2007  . Fibromyalgia 07/02/2007    Past Surgical History:  Procedure Laterality Date  . ABDOMINAL HYSTERECTOMY  1967  . APPENDECTOMY    . CARDIAC CATHETERIZATION  02/17/2003   normal L main, LAD free of disease, Cfx free of disease, RCA free of disease (Dr. Loni Muse. [facility])  . CATARACT EXTRACTION, BILATERAL    . CHOLECYSTECTOMY  2000  . COLONOSCOPY W/ BIOPSIES     multiple  . DENTAL SURGERY     multiple tooth extractions  . ESOPHAGOGASTRODUODENOSCOPY (EGD) WITH ESOPHAGEAL DILATION N/A 08/23/2012   Procedure: ESOPHAGOGASTRODUODENOSCOPY (EGD) WITH ESOPHAGEAL DILATION;  Surgeon: Milus Banister, MD;  Location: [facility] ENDOSCOPY;  Service: Endoscopy;  Laterality: N/A;  . NASAL SEPTUM SURGERY  1980  . NM MYOCAR PERF WALL MOTION  2003   persantine - normal static and dynamic study w/apical thinning and presvered LV function, no ischemia  . SINUS EXPLORATION     ossifiying fibroma  . TEMPOROMANDIBULAR JOINT SURGERY  1986   Dr. Terence Lux  . TRANSTHORACIC ECHOCARDIOGRAM  2001   mild LVH, normal LV    OB History    No data available       Home Medications    Prior to Admission medications   Medication Sig Start Date End Date Taking? Authorizing Provider  ALPRAZolam Duanne Moron) 0.25 MG tablet TAKE 1 TABLET BY MOUTH TWICE DAILY AS NEEDED FOR ANXIETY 11/17/16  Yes Reed, Tiffany L, DO  aspirin 325 MG tablet Take 1 tablet (325 mg total) by mouth daily. 07/28/14  Yes Reyne Dumas, MD  carboxymethylcellulose (REFRESH TEARS) 0.5 % SOLN Place 1 drop into both eyes 2 (two) times daily as needed (dry eyes).    Yes [provider]  CARTIA XT 180 MG 24 hr capsule TAKE ONE CAPSULE BY MOUTH ONCE DAILY 01/12/16  Yes Hilty, Nadean Corwin, MD  DULoxetine (CYMBALTA) 30 MG capsule Take one capsule by mouth once daily for back pain and  depression 08/11/16  Yes Lauree Chandler, NP  furosemide (LASIX) 40 MG tablet TAKE 1 TABLET BY MOUTH ONCE DAILY 11/06/16  Yes Reed, Tiffany L, DO  HYDROcodone-acetaminophen (NORCO/VICODIN) 5-325 MG tablet Take 1 tablet by mouth every 6 (six) hours as needed for moderate pain. 10/19/16  Yes Lauree Chandler, NP  lansoprazole (PREVACID) 30 MG capsule Take 1 capsule (30 mg total) by mouth daily before breakfast. 12/13/15  Yes Reed, Tiffany L, DO  LINZESS 290 MCG CAPS capsule TAKE 1 CAPSULE BY MOUTH ONCE DAILY BEFORE BREAKFAST 09/08/16  Yes Gatha Mayer, MD  LYRICA 75 MG capsule TAKE ONE CAPSULE BY MOUTH TWICE DAILY AS NEEDED FOR PAIN 08/14/16  Yes Reed, Tiffany L, DO  memantine (NAMENDA) 10 MG tablet TAKE ONE TABLET BY MOUTH TWICE DAILY 05/30/16  Yes Reed, Tiffany L, DO  MYRBETRIQ 25 MG TB24 tablet TAKE ONE TABLET BY MOUTH ONCE DAILY 10/04/16  Yes  Reed, Tiffany L, DO  pilocarpine (SALAGEN) 5 MG tablet Take 1 tablet (5 mg total) by mouth 2 (two) times daily. 11/08/16  Yes Deveshwar, Abel Presto, MD  polyethylene glycol (MIRALAX / GLYCOLAX) packet Take 17 g by mouth daily as needed for mild constipation.   Yes [provider]  potassium chloride (KLOR-CON) 20 MEQ packet Take 20 mEq by mouth daily. 08/31/16  Yes Reed, Tiffany L, DO  senna (SENOKOT) 8.6 MG TABS tablet Take 2 tablets (17.2 mg total) by mouth daily. 07/01/15  Yes Debbe Odea, MD  sodium chloride (OCEAN) 0.65 % SOLN nasal spray Place 1 spray into the nose as needed for congestion. 08/23/12  Yes Hongalgi, Lenis Dickinson, MD  DULoxetine (CYMBALTA) 60 MG capsule Take one capsule by mouth in the evening for depression Patient not taking: Reported on 11/29/2016 04/10/16   Mariea Clonts, Tiffany L, DO  hydroxychloroquine (PLAQUENIL) 200 MG tablet TAKE 1 TABLET BY MOUTH IN THE MORNING AND 1 IN THE EVENING MONDAY  THROUGH  FRIDAY  ONLY  ,  NONE  ON  SATURDAY  OR  SUNDAY Patient not taking: Reported on 11/29/2016 10/06/16   Bo Merino, MD  levofloxacin  (LEVAQUIN) 750 MG tablet Take 1 tablet (750 mg total) by mouth daily. 11/30/16   Duffy Bruce, MD    Family History Family History  Problem Relation Age of Onset  . Heart disease Father        heart attack  . Pneumonia Father   . Heart attack Mother   . Hypertension Mother   . Colon cancer Sister   . Kidney disease Daughter   . Asthma Daughter   . Arthritis Daughter 89       osteo,  . Heart disease Son 36       stage 3 CHF(Diastolic /Systolic)  . Throat cancer Brother   . Hypertension Maternal Grandmother     Social History Social History  Substance Use Topics  . Smoking status: Former Research scientist (life sciences)  . Smokeless tobacco: Never Used     Comment: Quit at age 45   . Alcohol use No     Allergies   Banana; Codeine; Klonopin [clonazepam]; Meperidine hcl; Norflex [orphenadrine citrate]; Oxycodone-acetaminophen; Propoxyphene hcl; Zoloft [sertraline hcl]; Doxycycline; Naproxen; Penicillins; Phenothiazines; Stelazine; Sulfamethoxazole-trimethoprim; Tolectin [tolmetin sodium]; and Tramadol   Review of Systems Review of Systems  Respiratory: Positive for cough.   Cardiovascular: Positive for chest pain (after coughing).  Gastrointestinal: Positive for abdominal pain and nausea.  All other systems reviewed and are negative.    Physical Exam Updated Vital Signs BP (!) 129/59   Pulse 63   Temp 98 F (36.7 C) (Oral)   Resp 14   Ht 5' 3.5" (1.613 m)   Wt 77.1 kg (170 lb)   SpO2 97%   BMI 29.64 kg/m   Physical Exam  Constitutional: She is oriented to person, place, and time. She appears well-developed and well-nourished. No distress.  HENT:  Head: Normocephalic and atraumatic.  Eyes: Conjunctivae are normal.  Neck: Neck supple.  Cardiovascular: Normal rate, regular rhythm and normal heart sounds.  Exam reveals no friction rub.   No murmur heard. Pulmonary/Chest: Effort normal. No respiratory distress. She has no wheezes. She has no rales. She exhibits tenderness (mild TTP  over bilateral chest wall, no bruising, no pinpoint TTP).  Scattered rhonchi, bilateral bases, clear with coughing  Abdominal: Soft. Bowel sounds are normal. She exhibits no distension. There is no tenderness. There is no guarding.  Musculoskeletal: She exhibits no edema.  Neurological: She is alert and oriented to person, place, and time. She exhibits normal muscle tone.  Skin: Skin is warm. Capillary refill takes less than 2 seconds.  Psychiatric: She has a normal mood and affect.  Nursing note and vitals reviewed.    ED Treatments / Results  Labs (all labs ordered are listed, but only abnormal results are displayed) Labs Reviewed  BASIC METABOLIC PANEL - Abnormal; Notable for the following:       Result Value   Glucose, Bld 199 (*)    BUN 28 (*)    All other components within normal limits  CBC  BRAIN NATRIURETIC PEPTIDE  I-STAT TROPONIN, ED  I-STAT TROPONIN, ED  POCT I-STAT TROPONIN I    EKG  EKG Interpretation  Date/Time:  Wednesday November 29 2016 16:36:20 EDT Ventricular Rate:  77 PR Interval:    QRS Duration: 107 QT Interval:  389 QTC Calculation: 441 R Axis:   -58 Text Interpretation:  Sinus rhythm Borderline prolonged PR interval Incomplete RBBB and LAFB No significant change since last tracing Confirmed by Duffy Bruce [phone]) on 11/29/2016 10:54:10 PM       Radiology Dg Chest 2 View  Result Date: 11/29/2016 CLINICAL DATA:  Cough and chest pain.  Bilateral rib pain EXAM: CHEST  2 VIEW COMPARISON:  06/29/2015 FINDINGS: The heart size and mediastinal contours are within normal limits. Both lungs are clear. Apical scarring bilaterally. The visualized skeletal structures are unremarkable. IMPRESSION: No active cardiopulmonary disease. Electronically Signed   By: Franchot Gallo M.D.   On: 11/29/2016 17:03    Procedures Procedures (including critical care time)  Medications Ordered in ED Medications  levofloxacin (LEVAQUIN) tablet 750 mg (750 mg Oral  Given 11/29/16 2316)     Initial Impression / Assessment and Plan / ED Course  I have reviewed the triage vital signs and the nursing notes.  Pertinent labs & imaging results that were available during my care of the patient were reviewed by me and considered in my medical decision making (see chart for details).     83 yo F here with cough, sputum production in setting of known sick contacts. Mild CP with coughing is likely MSK in etiology. CXR without focal PNA. Lab work is very reassuring. Suspect pt has viral URI/bronchitis, likely 2/2 known sick contacts. No hypoxia, normal WOB. No evidence of sepsis. Her EKG is non-ischemic, trop neg and normal BNP despite sx >24 hours - doubt ACS, PE, dissection, or PE. She does c/o some bloating but this is a chronic, ongoing issue x years and LFTs, lipase, abd exam benign and reassuring. She admits this has not changed acutely. Due to her comorbidities, will tx with ABX and advise PCP f/u in 24-48 hours. Return precautions discussed with pt and her caregiver, both in agreement. Pt would like to return home and I feel this is reasonable. Encouraged fluids at home. Of note - mildly hyperglycemic, will need follow-up. Suspect this is 2/2 her acute URI.  Final Clinical Impressions(s) / ED Diagnoses   Final diagnoses:  Cough  Bronchitis    New Prescriptions Discharge Medication List as of 11/29/2016 11:11 PM    START taking these medications   Details  levofloxacin (LEVAQUIN) 750 MG tablet Take 1 tablet (750 mg total) by mouth daily., Starting Thu 11/30/2016, Normal         Duffy Bruce, MD 11/30/16 [phone]

## 2016-11-29 NOTE — Telephone Encounter (Signed)
Phone just rang, no answer, no answering machine. Will try again later.

## 2016-11-29 NOTE — ED Notes (Signed)
First set of labs  I Stat troponin not resulted repeat drawn with IV start and taken to stat lab

## 2016-11-29 NOTE — Telephone Encounter (Signed)
-----   Message from Gayland Curry, DO sent at 11/22/2016 10:57 AM EDT ----- Pt had labs at rheumatology.  She looks dehydrated and needs to drink more water.   Tiffany L. Reed, D.O. [facility] [address], [place] [address] Cell Phone (Mon-Fri 8am-5pm):  [phone] On Call:  [phone] & follow prompts after 5pm & weekends Office Phone:  [phone] Office Fax:  [phone]

## 2016-11-30 ENCOUNTER — Other Ambulatory Visit: Payer: Self-pay | Admitting: Internal Medicine

## 2016-12-08 ENCOUNTER — Telehealth: Payer: Self-pay

## 2016-12-08 NOTE — Telephone Encounter (Signed)
Did a prior authorization for Linzess 290 mcg for patients chronic constipation K59.09 and her IBS with constipation K58.1 thru CoverMyMeds. She has tried the Miralax as they requested. We will await outcome.

## 2016-12-08 NOTE — Telephone Encounter (Signed)
Insurance called with approval for a year on patient's Linzess 284mcg, [facility] pharmacy informed.

## 2016-12-15 ENCOUNTER — Other Ambulatory Visit: Payer: Self-pay | Admitting: Nurse Practitioner

## 2016-12-15 ENCOUNTER — Other Ambulatory Visit: Payer: Self-pay | Admitting: Internal Medicine

## 2016-12-15 DIAGNOSIS — K2100 Gastro-esophageal reflux disease with esophagitis, without bleeding: Secondary | ICD-10-CM

## 2016-12-15 NOTE — Telephone Encounter (Signed)
rx called into pharmacy

## 2016-12-21 ENCOUNTER — Encounter: Payer: Self-pay | Admitting: Internal Medicine

## 2016-12-21 ENCOUNTER — Ambulatory Visit: Payer: Medicare Other | Admitting: Internal Medicine

## 2016-12-21 VITALS — BP 118/78 | HR 73 | Temp 97.5°F | Wt 161.0 lb

## 2016-12-21 DIAGNOSIS — I480 Paroxysmal atrial fibrillation: Secondary | ICD-10-CM

## 2016-12-21 DIAGNOSIS — M545 Low back pain, unspecified: Secondary | ICD-10-CM

## 2016-12-21 DIAGNOSIS — K582 Mixed irritable bowel syndrome: Secondary | ICD-10-CM

## 2016-12-21 DIAGNOSIS — J209 Acute bronchitis, unspecified: Secondary | ICD-10-CM

## 2016-12-21 DIAGNOSIS — Z23 Encounter for immunization: Secondary | ICD-10-CM | POA: Diagnosis not present

## 2016-12-21 DIAGNOSIS — F0150 Vascular dementia without behavioral disturbance: Secondary | ICD-10-CM | POA: Diagnosis not present

## 2016-12-21 DIAGNOSIS — M3500 Sicca syndrome, unspecified: Secondary | ICD-10-CM | POA: Diagnosis not present

## 2016-12-21 DIAGNOSIS — I10 Essential (primary) hypertension: Secondary | ICD-10-CM

## 2016-12-21 MED ORDER — HYDROCODONE-ACETAMINOPHEN 5-325 MG PO TABS: 1.0000 | ORAL_TABLET | Freq: Four times a day (QID) | ORAL | 0 refills | Status: DC | PRN

## 2016-12-21 NOTE — Telephone Encounter (Signed)
Spoke with patient at office visit today

## 2016-12-21 NOTE — Progress Notes (Signed)
Location:  [facility] clinic Provider:  Emmalynn Pinkham L. Mariea Clonts, D.O., C.M.D.  Code Status: DNR Goals of Care:  Advanced Directives 11/29/2016  Does Patient Have a Medical Advance Directive? Yes  Type of Advance Directive [place]  Does patient want to make changes to medical advance directive? -  Copy of [place] in Chart? -  Would patient like information on creating a medical advance directive? No - Patient declined  Pre-existing out of facility DNR order (yellow form or pink MOST form) -     Chief Complaint  Patient presents with  . Medical Management of Chronic Issues    10mth follow-up    HPI: Patient is a 83 y.o. female seen today for medical management of chronic diseases.    Mid last month, she had a cough that was getting worse and worse--sometimes productive, sometimes not.  She wound up in the ED b/c we had no power in the office.   She was given levaquin abx (one dose).  WBC was normal, CXR normal, slight elevation of BUN.  No longer coughing.  Still has a little hoarseness.  Taking cough drops.    Bowels better than they used to be.  Doing better with linzess.  Having some blood when she wipes.  Hemorrhoids are not hurting, but bother when she does have a BM.  Plaquenil discontinued 9/26 with Dr. Estanislado Pandy.  Pt d/o dry eyes and dry mouth.  Unclear if it's really worse or not b/c she's always c/o it as long as I've seen her.    Agreed to flu shot.   Past Medical History:  Diagnosis Date  . Abnormality of gait   . Adenomatous polyp of colon 2002   5mm  . Allergic rhinitis   . Anxiety   . Anxiety and depression   . Chronic back pain   . Dementia without behavioral disturbance   . Depression   . Diverticulosis of colon   . Dry eye syndrome   . Dysphagia   . Dysthymic disorder   . Fibromyalgia   . GERD (gastroesophageal reflux disease)   . H/O hiatal hernia   . History of adverse drug reaction   . History of cerebrovascular  disease 09/24/2014  . History of recurrent UTIs   . Hypertension, benign   . Irritable bowel syndrome   . Low back pain syndrome   . Memory loss   . Mitral valve prolapse   . Paroxysmal A-fib ([place])   . Peripheral neuropathy    "both feet and legs"  . Physical deconditioning   . Sjogren's syndrome ([place])   . Therapeutic opioid-induced constipation (OIC)   . Thyroid nodule   . Urinary incontinence     Past Surgical History:  Procedure Laterality Date  . ABDOMINAL HYSTERECTOMY  1967  . APPENDECTOMY    . CARDIAC CATHETERIZATION  02/17/2003   normal L main, LAD free of disease, Cfx free of disease, RCA free of disease (Dr. Loni Muse. [facility])  . CATARACT EXTRACTION, BILATERAL    . CHOLECYSTECTOMY  2000  . COLONOSCOPY W/ BIOPSIES     multiple  . DENTAL SURGERY     multiple tooth extractions  . NASAL SEPTUM SURGERY  1980  . NM MYOCAR PERF WALL MOTION  2003   persantine - normal static and dynamic study w/apical thinning and presvered LV function, no ischemia  . SINUS EXPLORATION     ossifiying fibroma  . TEMPOROMANDIBULAR JOINT SURGERY  1986   Dr. Terence Lux  .  TRANSTHORACIC ECHOCARDIOGRAM  2001   mild LVH, normal LV    Allergies  Allergen Reactions  . Banana Nausea And Vomiting  . Codeine Nausea Only    unless given with Phenergan  . Klonopin [Clonazepam] Other (See Comments)    Causes hallucination   . Meperidine Hcl Nausea Only    unless given with Phenergan  . Norflex [Orphenadrine Citrate] Nausea Only    Unless given with Phenergan  . Oxycodone-Acetaminophen Nausea Only    unless given with phenergan  . Propoxyphene Hcl Nausea Only    unless given with phenergan  . Zoloft [Sertraline Hcl] Other (See Comments)    Caused lethargy  . Doxycycline Other (See Comments)    Unknown  . Naproxen Other (See Comments)  . Penicillins Other (See Comments)    Unknown  . Phenothiazines Other (See Comments)    Unknown  . Stelazine Other (See Comments)    Unknown  .  Sulfamethoxazole-Trimethoprim Other (See Comments)    Unknown  . Tolectin [Tolmetin Sodium] Other (See Comments)    Unknown  . Tramadol Other (See Comments)    Unknown    Outpatient Encounter Medications as of 12/21/2016  Medication Sig  . ALPRAZolam (XANAX) 0.25 MG tablet TAKE 1 TABLET BY MOUTH TWICE DAILY AS NEEDED FOR ANXIETY  . aspirin 325 MG tablet Take 1 tablet (325 mg total) by mouth daily.  . carboxymethylcellulose (REFRESH TEARS) 0.5 % SOLN Place 1 drop into both eyes 2 (two) times daily as needed (dry eyes).   . CARTIA XT 180 MG 24 hr capsule TAKE ONE CAPSULE BY MOUTH ONCE DAILY  . DULoxetine (CYMBALTA) 30 MG capsule Take one capsule by mouth once daily for back pain and depression  . furosemide (LASIX) 40 MG tablet TAKE 1 TABLET BY MOUTH ONCE DAILY  . HYDROcodone-acetaminophen (NORCO/VICODIN) 5-325 MG tablet Take 1 tablet by mouth every 6 (six) hours as needed for moderate pain.  Marland Kitchen KLOR-CON M20 20 MEQ tablet TAKE 1 TABLET BY MOUTH ONCE DAILY  . lansoprazole (PREVACID) 30 MG capsule TAKE ONE CAPSULE BY MOUTH ONCE DAILY BEFORE BREAKFAST  . LINZESS 290 MCG CAPS capsule TAKE 1 CAPSULE BY MOUTH ONCE DAILY BEFORE BREAKFAST  . LYRICA 75 MG capsule TAKE ONE CAPSULE BY MOUTH TWICE DAILY AS NEEDED FOR PAIN  . memantine (NAMENDA) 10 MG tablet TAKE ONE TABLET BY MOUTH TWICE DAILY  . MYRBETRIQ 25 MG TB24 tablet TAKE ONE TABLET BY MOUTH ONCE DAILY  . pilocarpine (SALAGEN) 5 MG tablet Take 1 tablet (5 mg total) by mouth 2 (two) times daily.  . polyethylene glycol (MIRALAX / GLYCOLAX) packet Take 17 g by mouth daily as needed for mild constipation.  . senna (SENOKOT) 8.6 MG TABS tablet Take 2 tablets (17.2 mg total) by mouth daily.  . sodium chloride (OCEAN) 0.65 % SOLN nasal spray Place 1 spray into the nose as needed for congestion.  . [DISCONTINUED] potassium chloride (KLOR-CON) 20 MEQ packet Take 20 mEq by mouth daily.  . [DISCONTINUED] levofloxacin (LEVAQUIN) 750 MG tablet Take 1 tablet  (750 mg total) by mouth daily.   No facility-administered encounter medications on file as of 12/21/2016.     Review of Systems:  Review of Systems  Constitutional: Positive for malaise/fatigue. Negative for chills and fever.  HENT: Positive for hearing loss. Negative for congestion.   Eyes:       Dry eyes  Respiratory: Negative for cough, sputum production and shortness of breath.   Cardiovascular: Negative for chest pain, palpitations  and leg swelling.  Gastrointestinal: Positive for blood in stool and constipation.       Hemorrhoids  Genitourinary: Positive for frequency and urgency. Negative for dysuria.       OAB better with myrbetriq  Musculoskeletal: Positive for back pain and joint pain. Negative for falls.  Neurological: Positive for weakness. Negative for dizziness and loss of consciousness.  Endo/Heme/Allergies: Bruises/bleeds easily.  Psychiatric/Behavioral: Positive for depression and memory loss. The patient is nervous/anxious.     Health Maintenance  Topic Date Due  . DEXA SCAN  07/04/1996  . INFLUENZA VACCINE  09/13/2016  . MAMMOGRAM  02/03/2017  . TETANUS/TDAP  12/09/2023  . PNA vac Low Risk Adult  Completed    Physical Exam: Vitals:   12/21/16 1445  BP: 118/78  Pulse: 73  Temp: (!) 97.5 F (36.4 C)  TempSrc: Oral  SpO2: 95%  Weight: 161 lb (73 kg)   Body mass index is 28.07 kg/m. Physical Exam  Constitutional: No distress.  HENT:  Head: Normocephalic and atraumatic.  Cardiovascular: Intact distal pulses.  Midsystolic click; irreg irreg  Pulmonary/Chest: Effort normal. No respiratory distress. She has no wheezes. She has no rales.  Abdominal: Soft. Bowel sounds are normal. She exhibits distension. She exhibits no mass. There is no tenderness. There is no guarding.  Musculoskeletal: Normal range of motion.  Ambulates with walker, weak getting up out of chair  Neurological: She is alert.  Skin: Skin is warm and dry. Capillary refill takes less  than 2 seconds. There is pallor.  Psychiatric: She has a normal mood and affect.  In better spirits the past few visits    Labs reviewed: Basic Metabolic Panel: Recent Labs    04/04/16 1536 11/08/16 1629 11/29/16 1657  NA 140 138 141  K 4.3 4.2 4.3  CL 103 102 104  CO2 29 28 27   GLUCOSE 89 171* 199*  BUN 21 21 28*  CREATININE 0.76 1.02* 0.78  CALCIUM 9.9 10.1 10.2   Liver Function Tests: Recent Labs    04/04/16 1536 11/08/16 1629  AST 21 24  ALT 16 25  ALKPHOS 86  --   BILITOT 0.4 0.4  PROT 7.3 7.5  ALBUMIN 4.3  --    No results for input(s): LIPASE, AMYLASE in the last 8760 hours. No results for input(s): AMMONIA in the last 8760 hours. CBC: Recent Labs    04/04/16 1536 11/08/16 1629 11/29/16 1657  WBC 5.7 6.8 6.9  NEUTROABS 2,907 3,971  --   HGB 13.0 13.7 14.0  HCT 40.0 41.3 42.1  MCV 92.0 89.0 91.3  PLT 234 245 246   Lipid Panel: No results for input(s): CHOL, HDL, LDLCALC, TRIG, CHOLHDL, LDLDIRECT in the last 8760 hours. Lab Results  Component Value Date   HGBA1C 6.0 (H) 12/13/2015    Procedures since last visit: Dg Chest 2 View  Result Date: 11/29/2016 CLINICAL DATA:  Cough and chest pain.  Bilateral rib pain EXAM: CHEST  2 VIEW COMPARISON:  06/29/2015 FINDINGS: The heart size and mediastinal contours are within normal limits. Both lungs are clear. Apical scarring bilaterally. The visualized skeletal structures are unremarkable. IMPRESSION: No active cardiopulmonary disease. Electronically Signed   By: Franchot Gallo M.D.   On: 11/29/2016 17:03    Assessment/Plan 1. Acute bronchitis, unspecified organism -resolved with one dose abx and ED visit, doing fine now  2. PAF (paroxysmal atrial fibrillation) (HCC) -rate controlled, continues on cartia XT, full dose asa due to many many falls and living alone with  caregiver assistance only part time  3. SJOGREN'S SYNDROME -off plaquenil, if more bothersome, f/u with rheum, cont biotene, lozenges, dry  eye drops, pilocarpine (anticholinergic unfortunately)  4. HYPERTENSION, BENIGN -bp at goal with current regimen, cont same and monitor  5. Irritable bowel syndrome with both constipation and diarrhea -better lately with linzess for the constipation, some bleeding hemorrhoids at times, but generally a lot better   6. Vascular dementia without behavioral disturbance -ongoing, but spirits better lately, has had same caregiver who seems to be very good with her for a long time now, cont namenda therapy, I've reduced her benzo in the past and she's doing fine with the 0.25mg  rather than 0.5mg  for anxiety and her cymbalta which was helpful for pain when started  7. Low back pain associated with a spinal disorder other than radiculopathy or spinal stenosis -cont cymbalta, use conservative measure like heat, topicals, stretches, lyrica for radiculopathy, tylenol and hydrocodone when others are not enough--not to exceed 3g tylenol per 24h  8. Need for influenza vaccination - Flu vaccine HIGH DOSE PF (Fluzone High dose) given  Labs/tests ordered:   Orders Placed This Encounter  Procedures  . Flu vaccine HIGH DOSE PF (Fluzone High dose)    Next appt:  04/23/2017 med mgt   Quientin Jent L. David Rodriquez, D.O. [facility] [address], [place] [address] Cell Phone (Mon-Fri 8am-5pm):  [phone] On Call:  [phone] & follow prompts after 5pm & weekends Office Phone:  [phone] Office Fax:  [phone]

## 2017-01-07 ENCOUNTER — Other Ambulatory Visit: Payer: Self-pay | Admitting: Internal Medicine

## 2017-01-14 ENCOUNTER — Other Ambulatory Visit: Payer: Self-pay | Admitting: Internal Medicine

## 2017-01-14 ENCOUNTER — Other Ambulatory Visit: Payer: Self-pay | Admitting: Nurse Practitioner

## 2017-01-15 NOTE — Telephone Encounter (Signed)
A refill request was received from pharmacy for alprazolam 0.25 mg tablets. Rx was called in to pharmacy after checking last fill date on PMP [place] data base.

## 2017-02-02 ENCOUNTER — Other Ambulatory Visit: Payer: Self-pay | Admitting: Internal Medicine

## 2017-02-02 DIAGNOSIS — F341 Dysthymic disorder: Secondary | ICD-10-CM

## 2017-02-09 ENCOUNTER — Other Ambulatory Visit: Payer: Self-pay | Admitting: Internal Medicine

## 2017-02-15 ENCOUNTER — Other Ambulatory Visit: Payer: Self-pay

## 2017-02-15 ENCOUNTER — Ambulatory Visit (INDEPENDENT_AMBULATORY_CARE_PROVIDER_SITE_OTHER): Payer: Medicare Other | Admitting: Nurse Practitioner

## 2017-02-15 ENCOUNTER — Encounter: Payer: Self-pay | Admitting: Nurse Practitioner

## 2017-02-15 VITALS — BP 124/78 | HR 74 | Temp 98.3°F | Resp 17 | Ht 63.0 in | Wt 160.0 lb

## 2017-02-15 DIAGNOSIS — R300 Dysuria: Secondary | ICD-10-CM | POA: Diagnosis not present

## 2017-02-15 DIAGNOSIS — K582 Mixed irritable bowel syndrome: Secondary | ICD-10-CM

## 2017-02-15 DIAGNOSIS — F518 Other sleep disorders not due to a substance or known physiological condition: Secondary | ICD-10-CM | POA: Diagnosis not present

## 2017-02-15 DIAGNOSIS — K2100 Gastro-esophageal reflux disease with esophagitis, without bleeding: Secondary | ICD-10-CM

## 2017-02-15 DIAGNOSIS — G4720 Circadian rhythm sleep disorder, unspecified type: Secondary | ICD-10-CM | POA: Diagnosis not present

## 2017-02-15 LAB — POCT URINALYSIS DIPSTICK
Bilirubin, UA: NEGATIVE
Blood, UA: NEGATIVE
Glucose, UA: NEGATIVE
NITRITE UA: NEGATIVE
PROTEIN UA: NEGATIVE
SPEC GRAV UA: 1.02 (ref 1.010–1.025)
Urobilinogen, UA: 0.2 E.U./dL
pH, UA: 5 (ref 5.0–8.0)

## 2017-02-15 NOTE — Telephone Encounter (Signed)
Patient's caregiver stated that patient needed a refill of hydrocodone/apap 5-325 mg tablet while checking out appointment. Patient is due for a refill. PMP [place] website was checked and verified.

## 2017-02-15 NOTE — Progress Notes (Signed)
Careteam: Patient Care Team: Gayland Curry, DO as PCP - General (Geriatric Medicine) Noralee Space, MD as Consulting Physician (Pulmonary Disease) Maisie Fus, MD as Consulting Physician (Obstetrics and Gynecology) Pixie Casino, MD as Consulting Physician (Cardiology) Monna Fam, MD as Consulting Physician (Ophthalmology) Kathrynn Ducking, MD as Consulting Physician (Neurology) Franchot Gallo, MD as Consulting Physician (Urology) Lavonna Monarch, MD as Consulting Physician (Dermatology) Gatha Mayer, MD as Consulting Physician (Gastroenterology)  Advanced Directive information Does Patient Have a Medical Advance Directive?: No  Allergies  Allergen Reactions  . Banana Nausea And Vomiting  . Codeine Nausea Only    unless given with Phenergan  . Klonopin [Clonazepam] Other (See Comments)    Causes hallucination   . Meperidine Hcl Nausea Only    unless given with Phenergan  . Norflex [Orphenadrine Citrate] Nausea Only    Unless given with Phenergan  . Oxycodone-Acetaminophen Nausea Only    unless given with phenergan  . Propoxyphene Hcl Nausea Only    unless given with phenergan  . Zoloft [Sertraline Hcl] Other (See Comments)    Caused lethargy  . Doxycycline Other (See Comments)    Unknown  . Naproxen Other (See Comments)  . Penicillins Other (See Comments)    Unknown  . Phenothiazines Other (See Comments)    Unknown  . Stelazine Other (See Comments)    Unknown  . Sulfamethoxazole-Trimethoprim Other (See Comments)    Unknown  . Tolectin [Tolmetin Sodium] Other (See Comments)    Unknown  . Tramadol Other (See Comments)    Unknown    Chief Complaint  Patient presents with  . Acute Visit    Pt is being seen for increased tiredness/sleepiness x 3 weeks, dizziness and SOB today.      HPI: Patient is a 84 y.o. female seen in the office today due to more daytime sleepiness for the last 3 weeks. Pt lives along with caregiver who is here today.  She is with her M-F 9-5 Caregiver reports that when she gets there in the morning she always states  Her nights were bad.  Caregiver reports she thinks she goes to sleep when she leaves at 5 she goes to sleep and then gets up around  9 to take her night time medication. Then lays back down soon after that and then gets up early  Pt also complaining of indigestion and tightness in her lower abdomen/feeling of vomiting. Caregiver reports it is after she eats her stomach swells and it starts to hurt. Decrease appetite. Ate decent last night but unsure if she had any pain. When she lays down pain get worse. Reports she has had multiple GI work up due to this same GI issues but unable to find good treatment. This has been ongoing for over 2 years and there has not been any changes in these symptoms  However she does continues to complain on constipation. Taking linzess routinely but uses miralax and senna PRN  Reports some discomfort with urination- takes her a while to void. Has gotten up more at night. Reports she feels like she needs to go more but also having incontinence. Taking myrbetriq unsure if this has been helpful.   Review of Systems:  Review of Systems  Constitutional: Positive for malaise/fatigue. Negative for chills and fever.  HENT: Positive for hearing loss. Negative for congestion.   Eyes:       Dry eyes  Respiratory: Negative for cough, sputum production and shortness of breath.  Cardiovascular: Negative for chest pain, palpitations and leg swelling.  Gastrointestinal: Positive for constipation and heartburn.       Chronic feelings of bloating and heartburn  Genitourinary: Positive for dysuria, frequency and urgency.       OAB better with myrbetriq  Musculoskeletal: Positive for back pain and joint pain. Negative for falls.  Neurological: Positive for weakness. Negative for dizziness and loss of consciousness.  Endo/Heme/Allergies: Bruises/bleeds easily.    Psychiatric/Behavioral: Positive for memory loss.    Past Medical History:  Diagnosis Date  . Abnormality of gait   . Adenomatous polyp of colon 2002   17mm  . Allergic rhinitis   . Anxiety   . Anxiety and depression   . Chronic back pain   . Dementia without behavioral disturbance   . Depression   . Diverticulosis of colon   . Dry eye syndrome   . Dysphagia   . Dysthymic disorder   . Fibromyalgia   . GERD (gastroesophageal reflux disease)   . H/O hiatal hernia   . History of adverse drug reaction   . History of cerebrovascular disease 09/24/2014  . History of recurrent UTIs   . Hypertension, benign   . Irritable bowel syndrome   . Low back pain syndrome   . Memory loss   . Mitral valve prolapse   . Paroxysmal A-fib ([place])   . Peripheral neuropathy    "both feet and legs"  . Physical deconditioning   . Sjogren's syndrome ([place])   . Therapeutic opioid-induced constipation (OIC)   . Thyroid nodule   . Urinary incontinence    Past Surgical History:  Procedure Laterality Date  . ABDOMINAL HYSTERECTOMY  1967  . APPENDECTOMY    . CARDIAC CATHETERIZATION  02/17/2003   normal L main, LAD free of disease, Cfx free of disease, RCA free of disease (Dr. Loni Muse. [facility])  . CATARACT EXTRACTION, BILATERAL    . CHOLECYSTECTOMY  2000  . COLONOSCOPY W/ BIOPSIES     multiple  . DENTAL SURGERY     multiple tooth extractions  . ESOPHAGOGASTRODUODENOSCOPY (EGD) WITH ESOPHAGEAL DILATION N/A 08/23/2012   Procedure: ESOPHAGOGASTRODUODENOSCOPY (EGD) WITH ESOPHAGEAL DILATION;  Surgeon: Milus Banister, MD;  Location: [facility] ENDOSCOPY;  Service: Endoscopy;  Laterality: N/A;  . NASAL SEPTUM SURGERY  1980  . NM MYOCAR PERF WALL MOTION  2003   persantine - normal static and dynamic study w/apical thinning and presvered LV function, no ischemia  . SINUS EXPLORATION     ossifiying fibroma  . TEMPOROMANDIBULAR JOINT SURGERY  1986   Dr. Terence Lux  . TRANSTHORACIC ECHOCARDIOGRAM  2001   mild LVH, normal LV    Social History:   reports that she has quit smoking. she has never used smokeless tobacco. She reports that she does not drink alcohol or use drugs.  Family History  Problem Relation Age of Onset  . Heart disease Father        heart attack  . Pneumonia Father   . Heart attack Mother   . Hypertension Mother   . Colon cancer Sister   . Kidney disease Daughter   . Asthma Daughter   . Arthritis Daughter 41       osteo,  . Heart disease Son 75       stage 3 CHF(Diastolic /Systolic)  . Throat cancer Brother   . Hypertension Maternal Grandmother     Medications:   Medication List        Accurate as of 02/15/17  4:16 PM. Always  use your most recent med list.          ALPRAZolam 0.25 MG tablet Commonly known as:  XANAX TAKE 1 TABLET BY MOUTH TWICE DAILY AS NEEDED FOR ANXIETY   aspirin 325 MG tablet Take 1 tablet (325 mg total) by mouth daily.   CARTIA XT 180 MG 24 hr capsule Generic drug:  diltiazem TAKE ONE CAPSULE BY MOUTH ONCE DAILY   DULoxetine 30 MG capsule Commonly known as:  CYMBALTA TAKE 1 CAPSULE BY MOUTH ONCE DAILY FOR  BACK  PAIN  AND  DEPRESSION   furosemide 40 MG tablet Commonly known as:  LASIX TAKE 1 TABLET BY MOUTH ONCE DAILY   HYDROcodone-acetaminophen 5-325 MG tablet Commonly known as:  NORCO/VICODIN Take 1 tablet every 6 (six) hours as needed by mouth for moderate pain.   KLOR-CON M20 20 MEQ tablet Generic drug:  potassium chloride SA TAKE 1 TABLET BY MOUTH ONCE DAILY   lansoprazole 30 MG capsule Commonly known as:  PREVACID TAKE ONE CAPSULE BY MOUTH ONCE DAILY BEFORE BREAKFAST   LINZESS 290 MCG Caps capsule Generic drug:  linaclotide TAKE 1 CAPSULE BY MOUTH ONCE DAILY BEFORE BREAKFAST   LYRICA 75 MG capsule Generic drug:  pregabalin TAKE 1 CAPSULE BY MOUTH TWICE DAILY AS NEEDED FOR PAIN   memantine 10 MG tablet Commonly known as:  NAMENDA TAKE ONE TABLET BY MOUTH TWICE DAILY   MYRBETRIQ 25 MG Tb24 tablet Generic drug:  mirabegron  ER TAKE ONE TABLET BY MOUTH ONCE DAILY   pilocarpine 5 MG tablet Commonly known as:  SALAGEN Take 1 tablet (5 mg total) by mouth 2 (two) times daily.   polyethylene glycol packet Commonly known as:  MIRALAX / GLYCOLAX   REFRESH TEARS 0.5 % Soln Generic drug:  carboxymethylcellulose   senna 8.6 MG Tabs tablet Commonly known as:  SENOKOT Take 2 tablets (17.2 mg total) by mouth daily.   sodium chloride 0.65 % Soln nasal spray Commonly known as:  OCEAN Place 1 spray into the nose as needed for congestion.        Physical Exam:  Vitals:   02/15/17 1557  BP: 124/78  Pulse: 74  Resp: 17  Temp: 98.3 F (36.8 C)  TempSrc: Oral  SpO2: 95%  Weight: 160 lb (72.6 kg)  Height: 5\' 3"  (1.6 m)   Body mass index is 28.34 kg/m.  Physical Exam  Constitutional: She appears well-developed and well-nourished. No distress.  HENT:  Head: Normocephalic and atraumatic.  Cardiovascular: Normal rate and intact distal pulses. An irregularly irregular rhythm present.  Pulmonary/Chest: Effort normal and breath sounds normal. No respiratory distress. She has no wheezes. She has no rales.  Abdominal: Soft. Bowel sounds are normal. She exhibits distension. She exhibits no mass. There is no tenderness. There is no guarding.  Musculoskeletal: Normal range of motion.  Ambulates with walker, weak getting up out of chair  Neurological: She is alert.  Skin: Skin is warm and dry. Capillary refill takes less than 2 seconds. There is pallor.  Psychiatric:  Poor memory, repeats herself often    Labs reviewed: Basic Metabolic Panel: Recent Labs    04/04/16 1536 11/08/16 1629 11/29/16 1657  NA 140 138 141  K 4.3 4.2 4.3  CL 103 102 104  CO2 29 28 27   GLUCOSE 89 171* 199*  BUN 21 21 28*  CREATININE 0.76 1.02* 0.78  CALCIUM 9.9 10.1 10.2   Liver Function Tests: Recent Labs    04/04/16 1536 11/08/16 1629  AST  21 24  ALT 16 25  ALKPHOS 86  --   BILITOT 0.4 0.4  PROT 7.3 7.5  ALBUMIN  4.3  --    No results for input(s): LIPASE, AMYLASE in the last 8760 hours. No results for input(s): AMMONIA in the last 8760 hours. CBC: Recent Labs    04/04/16 1536 11/08/16 1629 11/29/16 1657  WBC 5.7 6.8 6.9  NEUTROABS 2,907 3,971  --   HGB 13.0 13.7 14.0  HCT 40.0 41.3 42.1  MCV 92.0 89.0 91.3  PLT 234 245 246   Lipid Panel: No results for input(s): CHOL, HDL, LDLCALC, TRIG, CHOLHDL, LDLDIRECT in the last 8760 hours. TSH: No results for input(s): TSH in the last 8760 hours. A1C: Lab Results  Component Value Date   HGBA1C 6.0 (H) 12/13/2015     Assessment/Plan 1. Irritable bowel syndrome with both constipation and diarrhea -having constipation at this time. Encouraged to use miralax daily and to cont current linzess daily.   2. Gastroesophageal reflux disease with esophagitis Unchanged, this has been ongoing for years with GI work up. Will cont to monitor.  3. Disrupted sleep-wake cycle -suspect she is sleeping more during the day due to disruption in her sleep cycle, encouraged to keep her until it is time to go to sleep at night.  Avoid naps and early sleep times.  4. Dysuria -encourage proper hydration will send UA for culture.  - POCT urinalysis dipstick  Maccoy Haubner K. Harle Battiest  [facility] [phone] 8 am - 5 pm) [phone] (after hours)

## 2017-02-15 NOTE — Patient Instructions (Addendum)
  Start Miralax daily to help with constipation.   Increase water intake   We will send urine off for culture

## 2017-02-16 ENCOUNTER — Other Ambulatory Visit: Payer: Self-pay | Admitting: Internal Medicine

## 2017-02-16 LAB — URINE CULTURE
MICRO NUMBER:: 90010909
SPECIMEN QUALITY: ADEQUATE

## 2017-02-16 MED ORDER — HYDROCODONE-ACETAMINOPHEN 5-325 MG PO TABS: 1.0000 | ORAL_TABLET | Freq: Four times a day (QID) | ORAL | 0 refills | Status: DC | PRN

## 2017-02-19 ENCOUNTER — Other Ambulatory Visit: Payer: Self-pay | Admitting: Nurse Practitioner

## 2017-02-19 DIAGNOSIS — F03A0 Unspecified dementia, mild, without behavioral disturbance, psychotic disturbance, mood disturbance, and anxiety: Secondary | ICD-10-CM

## 2017-02-19 DIAGNOSIS — F0390 Unspecified dementia without behavioral disturbance: Secondary | ICD-10-CM

## 2017-02-26 ENCOUNTER — Encounter: Payer: Self-pay | Admitting: Internal Medicine

## 2017-02-26 ENCOUNTER — Ambulatory Visit (INDEPENDENT_AMBULATORY_CARE_PROVIDER_SITE_OTHER): Payer: Medicare Other | Admitting: Internal Medicine

## 2017-02-26 VITALS — BP 148/58 | HR 71 | Ht 63.5 in | Wt 163.0 lb

## 2017-02-26 DIAGNOSIS — I10 Essential (primary) hypertension: Secondary | ICD-10-CM | POA: Diagnosis not present

## 2017-02-26 DIAGNOSIS — R296 Repeated falls: Secondary | ICD-10-CM | POA: Diagnosis not present

## 2017-02-26 DIAGNOSIS — F0391 Unspecified dementia with behavioral disturbance: Secondary | ICD-10-CM | POA: Diagnosis not present

## 2017-02-26 DIAGNOSIS — I480 Paroxysmal atrial fibrillation: Secondary | ICD-10-CM | POA: Diagnosis not present

## 2017-02-26 DIAGNOSIS — F03918 Unspecified dementia, unspecified severity, with other behavioral disturbance: Secondary | ICD-10-CM

## 2017-02-26 NOTE — Progress Notes (Signed)
OFFICE NOTE  Chief Complaint:  No complaints  Primary Care Physician: Gayland Curry, DO  HPI:  Ariana [name] an 84 year old female with history of some confusion in the past, hypertension and Sjogren syndrome. I had added lisinopril to her regimen which has helped control her blood pressure much better. She does report good control over her blood pressure over the past year; however, has had recent worsening problems such as cough, new productive sputum, problems with urinary tract infections. Weight is up back to 126 pounds after dip to 116 pounds but this is about where she is normally with a good appetite. Blood pressure is noted to be mildly elevated today; however, she is in some discomfort and has been short of breath with productive cough. Denies any chest pain. She does have pain in her back that shoots to her right thigh when sitting or leaning down. Today she has a number of other complaints. Her main issue is palpitations which she reports she had an episode of fast heart rate for almost one month but it has somewhat resolved. She still gets some recurrent palpitations.  I saw Ariana [name] back in the office today. She was recently seen by [name] and according to her notes his had marked progression of her dementia. She's now having significant behavioral disturbance as well as wandering. This is cause significant stress for her daughter and family. Compliance with medications is poor. There have been falls at home. Today in the office she is noted to be in new onset atrial fibrillation with rapid ventricular response. Heart rate is in the 120s. I had a long discussion with her daughter and her to the extent that she could understand about atrial fibrillation and her increased risk for stroke. Her CHADSVASC score is elevated at 4. She's also complaining of some abdominal discomfort but no significant chest pain. Blood pressure was low today at 90/70.  Unfortunately Ariana [name] returns  today for follow-up. She was recently hospitalized after an episode of transient global amnesia. She was ultimately found to have stroke with 2 clear punctate lesions on imaging. Is felt to be cardioembolic and may be related to her atrial fibrillation. She continues to have falls and in fact the fall was her initial presentation. As we discussed at length before she is at very high risk for complications on increased anticoagulation such as warfarin or a direct oral anticoagulant. Despite her CHADSVASC score now of 6, it is felt that anticoagulation is too risky. She has had an increase in her aspirin from 81 mg to 325 mg.  Ariana [name] returns today for follow-up. She continues to have significant behavioral issues related to dementia and possibly bipolar type symptoms. From a cardiac standpoint, her main complaints continue to be lower extremity swelling. She's not been fitted with compression stockings as it's been too difficult for her daughter to get her to [place]. She is requesting treatment for the edema although Ariana [name] reports that she does have significant incontinence. She continues to live at home and get some home assistance, but that is winding down after her recent stroke. She had another fall last week and I still feel is not a good candidate for anticoagulation beyond aspirin.  08/16/2015  I saw Ariana [name] back today in the office. Unfortunately she was recently admitted for UTI and was found to be bradycardic at the time. Beta blocker was discontinued although she remains on diltiazem. She was placed on low-dose losartan for  additional blood pressure control. Since then she's had some labile blood pressures including some blood pressures in the 90s and low 102 systolic which is cause her to be dizzy and feel fatigued. Mention or some degree of autonomic dysfunction.  08/17/2016  Ariana [name] returns today for follow-up. Recently she's had some leg swelling and some weight gain. She was  put on Lasix by her primary care provider of her weight is fairly stable and not come down much. Daughter reported that she's been using boost on the weekends at least 3 or 4 times a day in addition to whatever she is eating. It could be that this is weight gain mostly related to increase calories. Blood pressure is actually been less labile recently. She is in an ectopic atrial rhythm today.  03/03/2017  Ariana [name] returns today for follow-up.  Overall she has a number of complaints mostly with her bowels.  She is gained some weight and reports some indigestion.  She has had trouble with constipation.  This is been an ongoing issue for her.  She has paroxysmal AF but is in sinus rhythm today.  She denies any chest pain.  She is not anticoagulated due to fall risk.  PMHx:  Past Medical History:  Diagnosis Date  . Abnormality of gait   . Adenomatous polyp of colon 2002   53mm  . Allergic rhinitis   . Anxiety   . Anxiety and depression   . Chronic back pain   . Dementia without behavioral disturbance   . Depression   . Diverticulosis of colon   . Dry eye syndrome   . Dysphagia   . Dysthymic disorder   . Fibromyalgia   . GERD (gastroesophageal reflux disease)   . H/O hiatal hernia   . History of adverse drug reaction   . History of cerebrovascular disease 09/24/2014  . History of recurrent UTIs   . Hypertension, benign   . Irritable bowel syndrome   . Low back pain syndrome   . Memory loss   . Mitral valve prolapse   . Paroxysmal A-fib ([place])   . Peripheral neuropathy    "both feet and legs"  . Physical deconditioning   . Sjogren's syndrome ([place])   . Therapeutic opioid-induced constipation (OIC)   . Thyroid nodule   . Urinary incontinence     Past Surgical History:  Procedure Laterality Date  . ABDOMINAL HYSTERECTOMY  1967  . APPENDECTOMY    . CARDIAC CATHETERIZATION  02/17/2003   normal L main, LAD free of disease, Cfx free of disease, RCA free of disease (Dr. Loni Muse. [place])  .  CATARACT EXTRACTION, BILATERAL    . CHOLECYSTECTOMY  2000  . COLONOSCOPY W/ BIOPSIES     multiple  . DENTAL SURGERY     multiple tooth extractions  . ESOPHAGOGASTRODUODENOSCOPY (EGD) WITH ESOPHAGEAL DILATION N/A 08/23/2012   Procedure: ESOPHAGOGASTRODUODENOSCOPY (EGD) WITH ESOPHAGEAL DILATION;  Surgeon: Milus Banister, MD;  Location: [facility];  Service: Endoscopy;  Laterality: N/A;  . NASAL SEPTUM SURGERY  1980  . NM MYOCAR PERF WALL MOTION  2003   persantine - normal static and dynamic study w/apical thinning and presvered LV function, no ischemia  . SINUS EXPLORATION     ossifiying fibroma  . TEMPOROMANDIBULAR JOINT SURGERY  1986   Dr. Terence Lux  . TRANSTHORACIC ECHOCARDIOGRAM  2001   mild LVH, normal LV    FAMHx:  Family History  Problem Relation Age of Onset  . Heart disease Father  heart attack  . Pneumonia Father   . Heart attack Mother   . Hypertension Mother   . Colon cancer Sister   . Kidney disease Daughter   . Asthma Daughter   . Arthritis Daughter 80       osteo,  . Heart disease Son 18       stage 3 CHF(Diastolic /Systolic)  . Throat cancer Brother   . Hypertension Maternal Grandmother     SOCHx:   reports that she has quit smoking. she has never used smokeless tobacco. She reports that she does not drink alcohol or use drugs.  ALLERGIES:  Allergies  Allergen Reactions  . Banana Nausea And Vomiting  . Codeine Nausea Only    unless given with Phenergan  . Klonopin [Clonazepam] Other (See Comments)    Causes hallucination   . Meperidine Hcl Nausea Only    unless given with Phenergan  . Norflex [Orphenadrine Citrate] Nausea Only    Unless given with Phenergan  . Oxycodone-Acetaminophen Nausea Only    unless given with phenergan  . Propoxyphene Hcl Nausea Only    unless given with phenergan  . Zoloft [Sertraline Hcl] Other (See Comments)    Caused lethargy  . Doxycycline Other (See Comments)    Unknown  . Naproxen Other (See Comments)    . Penicillins Other (See Comments)    Unknown  . Phenothiazines Other (See Comments)    Unknown  . Stelazine Other (See Comments)    Unknown  . Sulfamethoxazole-Trimethoprim Other (See Comments)    Unknown  . Tolectin [Tolmetin Sodium] Other (See Comments)    Unknown  . Tramadol Other (See Comments)    Unknown    ROS: Pertinent items noted in HPI and remainder of comprehensive ROS otherwise negative.  HOME MEDS: Current Outpatient Medications  Medication Sig Dispense Refill  . ALPRAZolam (XANAX) 0.25 MG tablet TAKE 1 TABLET BY MOUTH TWICE DAILY AS NEEDED 60 tablet 0  . aspirin 325 MG tablet Take 1 tablet (325 mg total) by mouth daily. 30 tablet 2  . carboxymethylcellulose (REFRESH TEARS) 0.5 % SOLN Place 1 drop into both eyes 2 (two) times daily as needed (dry eyes).     . CARTIA XT 180 MG 24 hr capsule TAKE ONE CAPSULE BY MOUTH ONCE DAILY 90 capsule 3  . DULoxetine (CYMBALTA) 30 MG capsule TAKE 1 CAPSULE BY MOUTH ONCE DAILY FOR  BACK  PAIN  AND  DEPRESSION 90 capsule 1  . furosemide (LASIX) 40 MG tablet TAKE 1 TABLET BY MOUTH ONCE DAILY 90 tablet 0  . HYDROcodone-acetaminophen (NORCO/VICODIN) 5-325 MG tablet Take 1 tablet by mouth every 6 (six) hours as needed for moderate pain. 120 tablet 0  . KLOR-CON M20 20 MEQ tablet TAKE 1 TABLET BY MOUTH ONCE DAILY 30 tablet 3  . lansoprazole (PREVACID) 30 MG capsule TAKE ONE CAPSULE BY MOUTH ONCE DAILY BEFORE BREAKFAST 90 capsule 0  . LINZESS 290 MCG CAPS capsule TAKE 1 CAPSULE BY MOUTH ONCE DAILY BEFORE BREAKFAST 30 capsule 0  . LYRICA 75 MG capsule TAKE 1 CAPSULE BY MOUTH TWICE DAILY AS NEEDED FOR PAIN 60 capsule 5  . memantine (NAMENDA) 10 MG tablet TAKE ONE TABLET BY MOUTH TWICE DAILY 180 tablet 3  . MYRBETRIQ 25 MG TB24 tablet TAKE ONE TABLET BY MOUTH ONCE DAILY 90 tablet 1  . pilocarpine (SALAGEN) 5 MG tablet Take 1 tablet (5 mg total) by mouth 2 (two) times daily. 180 tablet 1  . polyethylene glycol (MIRALAX / GLYCOLAX)  packet Take  17 g by mouth daily as needed for mild constipation.    . senna (SENOKOT) 8.6 MG TABS tablet Take 2 tablets (17.2 mg total) by mouth daily. 120 each 0  . sodium chloride (OCEAN) 0.65 % SOLN nasal spray Place 1 spray into the nose as needed for congestion. 30 mL 0   No current facility-administered medications for this visit.     LABS/IMAGING: No results found for this or any previous visit (from the past 48 hour(s)). No results found.  VITALS: BP (!) 148/58   Pulse 71   Ht 5' 3.5" (1.613 m)   Wt 163 lb (73.9 kg)   BMI 28.42 kg/m   EXAM: General appearance: alert and no distress Neck: no carotid bruit and no JVD Lungs: clear to auscultation bilaterally Heart: regular rate and rhythm, S1, S2 normal, no murmur, click, rub or gallop Abdomen: soft, non-tender; bowel sounds normal; no masses,  no organomegaly Extremities: extremities normal, atraumatic, no cyanosis or edema Pulses: 2+ and symmetric Skin: Skin color, texture, turgor normal. No rashes or lesions Neurologic: Grossly normal Psych: Pleasant  EKG: Sinus rhythm first-degree AV block at 71, incomplete right bundle branch block, nonspecific T wave changes-personally reviewed  ASSESSMENT: 1. New-onset paroxysmal atrial fibrillation with rapid ventricular response - CHADSVASC 6 (not on warfarin due to falls) 2. Recent stroke 3. Labile hypertension - allowing higher BP than normal 4. Chest wall pain 5. Palpitations 6. Sjogren syndrome 7. Progressive dementia with behavioral disturbance 8. Falls and medication noncompliance 9. Asymmetric left lower extremity edema 10.   PLAN: 1.   [name] denies any chest pain or worsening shortness of breath.  She has had no recent palpitations although she had new onset PAF, she is in sinus rhythm today.  She is not anticoagulated, despite a CHADSVASC score of 6.  She is a very high fall risk.  Plan to continue her current medications and see her back annually or sooner as  necessary.  Pixie Casino, MD, [facility], [place] Director of the [facility]  Card[facility] Diplomate of the American Board of Clinical Lipidology Attending Cardiologist  Direct Dial: [phone]  Fax: [phone]  Website:  [email or web address] 02/26/2017, 4:26 PM

## 2017-02-26 NOTE — Patient Instructions (Signed)
[facility] vaccine  Your physician wants you to follow-up in ONE YEAR with Dr. Debara Pickett. You will receive a reminder letter in the mail two months in advance. If you don't receive a letter, please call our office to schedule the follow-up appointment.  Thank you for choosing [facility] at [facility]!!

## 2017-03-08 ENCOUNTER — Encounter: Payer: Self-pay | Admitting: Rheumatology

## 2017-03-08 ENCOUNTER — Telehealth: Payer: Self-pay | Admitting: Rheumatology

## 2017-03-08 ENCOUNTER — Ambulatory Visit (INDEPENDENT_AMBULATORY_CARE_PROVIDER_SITE_OTHER): Payer: Self-pay

## 2017-03-08 ENCOUNTER — Ambulatory Visit (INDEPENDENT_AMBULATORY_CARE_PROVIDER_SITE_OTHER): Payer: Medicare Other | Admitting: Rheumatology

## 2017-03-08 VITALS — BP 119/62 | HR 75 | Resp 13 | Wt 162.0 lb

## 2017-03-08 DIAGNOSIS — G609 Hereditary and idiopathic neuropathy, unspecified: Secondary | ICD-10-CM | POA: Diagnosis not present

## 2017-03-08 DIAGNOSIS — Z87898 Personal history of other specified conditions: Secondary | ICD-10-CM

## 2017-03-08 DIAGNOSIS — Z8719 Personal history of other diseases of the digestive system: Secondary | ICD-10-CM

## 2017-03-08 DIAGNOSIS — M545 Low back pain, unspecified: Secondary | ICD-10-CM

## 2017-03-08 DIAGNOSIS — M19041 Primary osteoarthritis, right hand: Secondary | ICD-10-CM | POA: Diagnosis not present

## 2017-03-08 DIAGNOSIS — M19071 Primary osteoarthritis, right ankle and foot: Secondary | ICD-10-CM

## 2017-03-08 DIAGNOSIS — M8589 Other specified disorders of bone density and structure, multiple sites: Secondary | ICD-10-CM

## 2017-03-08 DIAGNOSIS — M3509 Sicca syndrome with other organ involvement: Secondary | ICD-10-CM

## 2017-03-08 DIAGNOSIS — M5136 Other intervertebral disc degeneration, lumbar region: Secondary | ICD-10-CM | POA: Diagnosis not present

## 2017-03-08 DIAGNOSIS — M19042 Primary osteoarthritis, left hand: Secondary | ICD-10-CM

## 2017-03-08 DIAGNOSIS — M7061 Trochanteric bursitis, right hip: Secondary | ICD-10-CM

## 2017-03-08 DIAGNOSIS — Z79899 Other long term (current) drug therapy: Secondary | ICD-10-CM

## 2017-03-08 DIAGNOSIS — G8929 Other chronic pain: Secondary | ICD-10-CM

## 2017-03-08 DIAGNOSIS — Z8679 Personal history of other diseases of the circulatory system: Secondary | ICD-10-CM | POA: Diagnosis not present

## 2017-03-08 DIAGNOSIS — M19072 Primary osteoarthritis, left ankle and foot: Secondary | ICD-10-CM

## 2017-03-08 DIAGNOSIS — Z8673 Personal history of transient ischemic attack (TIA), and cerebral infarction without residual deficits: Secondary | ICD-10-CM

## 2017-03-08 DIAGNOSIS — Z8639 Personal history of other endocrine, nutritional and metabolic disease: Secondary | ICD-10-CM | POA: Diagnosis not present

## 2017-03-08 MED ORDER — HYDROXYCHLOROQUINE SULFATE 200 MG PO TABS: ORAL_TABLET | ORAL | 0 refills | Status: DC

## 2017-03-08 MED ORDER — TRIAMCINOLONE ACETONIDE 40 MG/ML IJ SUSP
40.0000 mg | INTRAMUSCULAR | Status: AC | PRN
  Administered 2017-03-08: 40 mg via INTRA_ARTICULAR

## 2017-03-08 MED ORDER — LIDOCAINE HCL (PF) 1 % IJ SOLN
1.5000 mL | INTRAMUSCULAR | Status: AC | PRN
  Administered 2017-03-08: 1.5 mL

## 2017-03-08 NOTE — Telephone Encounter (Signed)
[name] who is the patient's caregiver called stating that Ariana [name] is having back pain, bilateral knee pain, as well as foot pain.  She is also having a difficult time walking.  Requested a return call at [phone]

## 2017-03-08 NOTE — Telephone Encounter (Signed)
Patient has been scheduled for 03/08/17 at 1:00 pm

## 2017-03-08 NOTE — Progress Notes (Signed)
Office Visit Note  Patient: Ariana [name]             Date of Birth: 26-Aug-1929           MRN: [id number]             PCP: Gayland Curry, DO Referring: Gayland Curry, DO Visit Date: 03/08/2017 Occupation: @GUAROCC @    Subjective:  Pain of in both Knees and Pain of the Lower Back    History of Present Illness: Ariana [name] is a 84 y.o. female with history of osteoarthritis, disc disease and Sjogren's. She states she continues to have pain and discomfort in her bilateral knee joints and lower back. She has increased pain for the last 4 days. She describes her pain to be severe and she is having difficulty with mobility. She continues to have sicca symptoms.  Activities of Daily Living:  Patient reports morning stiffness for 5 minutes.   Patient Reports nocturnal pain.  Difficulty dressing/grooming: Reports Difficulty climbing stairs: Reports Difficulty getting out of chair: Reports Difficulty using hands for taps, buttons, cutlery, and/or writing: Denies   Review of Systems  Constitutional: Positive for fatigue. Negative for night sweats, weight gain, weight loss and weakness.  HENT: Positive for mouth dryness. Negative for mouth sores, trouble swallowing, trouble swallowing and nose dryness.   Eyes: Positive for dryness. Negative for pain, redness and visual disturbance.  Respiratory: Negative for cough, shortness of breath and difficulty breathing.   Cardiovascular: Negative for chest pain, palpitations, hypertension, irregular heartbeat and swelling in legs/feet.  Gastrointestinal: Negative for blood in stool, constipation and diarrhea.  Endocrine: Negative for increased urination.  Genitourinary: Negative for vaginal dryness.  Musculoskeletal: Positive for arthralgias, joint pain, myalgias, morning stiffness and myalgias. Negative for joint swelling, muscle weakness and muscle tenderness.  Skin: Negative for color change, rash, hair loss, skin tightness, ulcers and  sensitivity to sunlight.  Allergic/Immunologic: Negative for susceptible to infections.  Neurological: Negative for dizziness, memory loss and night sweats.  Hematological: Negative for swollen glands.  Psychiatric/Behavioral: Positive for sleep disturbance. Negative for depressed mood. The patient is nervous/anxious.     PMFS History:  Patient Active Problem List   Diagnosis Date Noted  . Sjogren's disease ([place]) 04/03/2016  . Primary osteoarthritis of both hands 04/03/2016  . High risk medication use 04/03/2016  . Senile dementia, with behavioral disturbance 08/16/2015  . Swelling 08/16/2015  . Diastolic dysfunction 25/06/3974  . Hypertonicity, bladder 07/08/2015  . Arterial hypotension   . Bradycardia 06/29/2015  . Chronic lower back pain 06/29/2015  . PAF (paroxysmal atrial fibrillation) ([place]) 04/18/2015  . Dehydration 04/18/2015  . Dementia without behavioral disturbance 04/18/2015  . TIA (transient ischemic attack) 12/17/2014  . Chest pain 12/16/2014  . Acute encephalopathy 12/04/2014  . Fall   . AKI (acute kidney injury) ([place]) 12/03/2014  . History of cerebrovascular disease 09/24/2014  . Falls frequently 07/28/2014  . Infarction of parietal lobe   . Mild dementia   . CVA (cerebral infarction) 07/26/2014  . Constipation 04/15/2014  . Mixed stress and urge urinary incontinence 11/03/2013  . Therapeutic opioid induced constipation 11/03/2013  . Hemorrhoid 11/03/2013  . Low back pain associated with a spinal disorder other than radiculopathy or spinal stenosis 11/03/2013  . Protein-calorie malnutrition, severe ([place]) 07/22/2013  . UTI (lower urinary tract infection) 07/20/2013  . Prolonged QT interval 07/20/2013  . Osteopenia 07/17/2013  . Palpitations 06/30/2013  . Hereditary and idiopathic peripheral neuropathy 05/22/2013  . Abnormality of  gait 12/05/2012  . Headache(784.0) 09/05/2012  . Multinodular thyroid 01/15/2012  . Neck pain 01/15/2012  .  Hypercholesterolemia 07/08/2010  . Tear film insufficiency 07/06/2009  . [place] SYNDROME 07/06/2009  . Urge urinary incontinence 01/06/2009  . COLONIC POLYPS, ADENOMATOUS, HX OF 04/15/2008  . MITRAL VALVE PROLAPSE 11/05/2007  . ANXIETY DEPRESSION 07/02/2007  . Mononeuritis 07/02/2007  . HYPERTENSION, BENIGN 07/02/2007  . GERD 07/02/2007  . Irritable bowel syndrome 07/02/2007  . Fibromyalgia 07/02/2007    Past Medical History:  Diagnosis Date  . Abnormality of gait   . Adenomatous polyp of colon 2002   60mm  . Allergic rhinitis   . Anxiety   . Anxiety and depression   . Chronic back pain   . Dementia without behavioral disturbance   . Depression   . Diverticulosis of colon   . Dry eye syndrome   . Dysphagia   . Dysthymic disorder   . Fibromyalgia   . GERD (gastroesophageal reflux disease)   . H/O hiatal hernia   . History of adverse drug reaction   . History of cerebrovascular disease 09/24/2014  . History of recurrent UTIs   . Hypertension, benign   . Irritable bowel syndrome   . Low back pain syndrome   . Memory loss   . Mitral valve prolapse   . Paroxysmal A-fib ([place])   . Peripheral neuropathy    "both feet and legs"  . Physical deconditioning   . Sjogren's syndrome ([place])   . Therapeutic opioid-induced constipation ([facility])   . Thyroid nodule   . Urinary incontinence     Family History  Problem Relation Age of Onset  . Heart disease Father        heart attack  . Pneumonia Father   . Heart attack Mother   . Hypertension Mother   . Colon cancer Sister   . Kidney disease Daughter   . Asthma Daughter   . Arthritis Daughter 35       osteo,  . Heart disease Son 14       stage 3 CHF(Diastolic /Systolic)  . Throat cancer Brother   . Hypertension Maternal Grandmother    Past Surgical History:  Procedure Laterality Date  . ABDOMINAL HYSTERECTOMY  1967  . APPENDECTOMY    . CARDIAC CATHETERIZATION  02/17/2003   normal L main, LAD free of disease, Cfx free of  disease, RCA free of disease (Dr. Loni Muse. [place])  . CATARACT EXTRACTION, BILATERAL    . CHOLECYSTECTOMY  2000  . COLONOSCOPY W/ BIOPSIES     multiple  . DENTAL SURGERY     multiple tooth extractions  . ESOPHAGOGASTRODUODENOSCOPY (EGD) WITH ESOPHAGEAL DILATION N/A 08/23/2012   Procedure: ESOPHAGOGASTRODUODENOSCOPY (EGD) WITH ESOPHAGEAL DILATION;  Surgeon: Milus Banister, MD;  Location: [facility];  Service: Endoscopy;  Laterality: N/A;  . NASAL SEPTUM SURGERY  1980  . NM MYOCAR PERF WALL MOTION  2003   persantine - normal static and dynamic study w/apical thinning and presvered LV function, no ischemia  . SINUS EXPLORATION     ossifiying fibroma  . TEMPOROMANDIBULAR JOINT SURGERY  1986   Dr. Terence Lux  . 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    Patient is Widowed.    Patient has 2 children.    Patient is retired.    Former smoker   Alcohol none  Exercise Walk, exercise chair 4 days a week   POA    Walks with cane      Patient drinks about 1-2 cups of hot tea daily.   Patient is right handed.                 Objective: Vital Signs: BP 119/62 (BP Location: Left Arm, Patient Position: Sitting, Cuff Size: Small)   Pulse 75   Resp 13   Wt 162 lb (73.5 kg)   BMI 28.25 kg/m    Physical Exam  Constitutional: She is oriented to person, place, and time. She appears well-developed and well-nourished.  HENT:  Head: Normocephalic and atraumatic.  Eyes: Conjunctivae and EOM are normal.  Neck: Normal range of motion.  Cardiovascular: Normal rate, regular rhythm, normal heart sounds and intact distal pulses.  Pulmonary/Chest: Effort normal and breath sounds normal.  Abdominal: Soft. Bowel sounds are normal.  Lymphadenopathy:    She has no cervical adenopathy.  Neurological: She is alert and oriented to person, place, and time.  Skin: Skin is warm and dry.  Capillary refill takes less than 2 seconds.  Psychiatric: She has a normal mood and affect. Her behavior is normal.  Nursing note and vitals reviewed.    Musculoskeletal Exam: C-spine and thoracic lumbar spine limited range of motion. She has discomfort range of motion of her lumbar spine. Shoulder joint abduction was limited to 110 with discomfort. Elbow joints are good range of motion. She has no synovitis over her hands. She has DIP PIP thickening in her hands and feet consistent with osteoarthritis. She has tenderness on palpation over right trochanteric bursa consistent with trochanteric bursitis. She is crepitus and discomfort range of motion of bilateral knee joints without any warmth swelling or effusion.  CDAI Exam: No CDAI exam completed.    Investigation: No additional findings. CBC Latest Ref Rng & Units 11/29/2016 11/08/2016 04/04/2016  WBC 4.0 - 10.5 K/uL 6.9 6.8 5.7  Hemoglobin 12.0 - 15.0 g/dL 14.0 13.7 13.0  Hematocrit 36.0 - 46.0 % 42.1 41.3 40.0  Platelets 150 - 400 K/uL 246 245 234   CMP Latest Ref Rng & Units 11/29/2016 11/08/2016 04/04/2016  Glucose 65 - 99 mg/dL 199(H) 171(H) 89  BUN 6 - 20 mg/dL 28(H) 21 21  Creatinine 0.44 - 1.00 mg/dL 0.78 1.02(H) 0.76  Sodium 135 - 145 mmol/L 141 138 140  Potassium 3.5 - 5.1 mmol/L 4.3 4.2 4.3  Chloride 101 - 111 mmol/L 104 102 103  CO2 22 - 32 mmol/L 27 28 29   Calcium 8.9 - 10.3 mg/dL 10.2 10.1 9.9  Total Protein 6.1 - 8.1 g/dL - 7.5 7.3  Total Bilirubin 0.2 - 1.2 mg/dL - 0.4 0.4  Alkaline Phos 33 - 130 U/L - - 86  AST 10 - 35 U/L - 24 21  ALT 6 - 29 U/L - 25 16    Imaging: Xr Lumbar Spine 2-3 Views  Result Date: 03/08/2017 Multilevel mild is spondylosis without any vertebral fracture. L5-S1 narrowing. Facet joint arthropathy noted. Impression: Spondylosis of lumbar spine   Speciality Comments: PLQ eye exam: 09/27/2016 Normal. Dr. Prudencio Burly. Follow up in 12 months.    Procedures:  Large Joint Inj: R greater trochanter  on 03/08/2017 1:56 PM Indications: pain Details: 27 G 1.5 in needle, lateral approach  Arthrogram: No  Medications: 1.5 mL lidocaine (PF) 1 %; 40 mg triamcinolone acetonide 40 MG/ML Aspirate: 0 mL Outcome: tolerated well, no immediate complications Procedure, treatment alternatives, risks and benefits explained,  specific risks discussed. Consent was given by the patient. Immediately prior to procedure a time out was called to verify the correct patient, procedure, equipment, support staff and site/side marked as required. Patient was prepped and draped in the usual sterile fashion.     Allergies: Banana; Codeine; Klonopin [clonazepam]; Meperidine hcl; Norflex [orphenadrine citrate]; Oxycodone-acetaminophen; Propoxyphene hcl; Zoloft [sertraline hcl]; Doxycycline; Naproxen; Penicillins; Phenothiazines; Stelazine; Sulfamethoxazole-trimethoprim; Tolectin [tolmetin sodium]; and Tramadol   Assessment / Plan:     Visit Diagnoses: Sjogren's syndrome with other organ involvement ([place]) - +ANA, -Ro,-La. She's been using pilocarpine which is been helpful. She has sicca symptoms but they're tolerable. She's been using over-the-counter products as well. Patient reports that her sicca symptoms are worse off Plaquenil and she wants to go back on Plaquenil indications CONTRAINDICATIONS were reviewed again. Her last eye exam was in August 2019. I will call and discussion for Plaquenil  200 mg by mouth daily. I'll obtain following labs today.  Primary osteoarthritis of both hands: Chronic pain. Joint protection and muscle strengthening discussed.  Primary osteoarthritis of both feet: Proper fitting shoes were discussed.  Osteopenia of multiple sites - She will continue calcium and vitamin D.  Trochanteric bursitis of right hip: She's been having a lot of discomfort in her right trochanteric area. After different treatment options were discussed and side effects were reviewed the right trochanter was injected as  described above.  Chronic midline low back pain without sciatica - Plan: XR Lumbar Spine 2-3 Views  DDD (degenerative disc disease), lumbar  Hereditary and idiopathic peripheral neuropathy  History of hypertension  History of TIA (transient ischemic attack)  History of gastroesophageal reflux (GERD)  History of hypercholesterolemia  History of insomnia  History of memory loss  History of atrial fibrillation    Orders: Orders Placed This Encounter  Procedures  . Large Joint Inj: R greater trochanter  . XR Lumbar Spine 2-3 Views  . CBC with Differential/Platelet  . COMPLETE METABOLIC PANEL WITH GFR  . Anti-DNA antibody, double-stranded  . C3 and C4  . ANA  . Sjogrens syndrome-A extractable nuclear antibody  . Sjogrens syndrome-B extractable nuclear antibody   Meds ordered this encounter  Medications  . hydroxychloroquine (PLAQUENIL) 200 MG tablet    Sig: Take 200mg  po qd    Dispense:  90 tablet    Refill:  0    Face-to-face time spent with patient was 30 minutes. Greater than 50% of time was spent in counseling and coordination of care.  Follow-Up Instructions: Return in about 5 months (around 08/06/2017) for Sjogren's, Osteoarthritis.   Bo Merino, MD  Note - This record has been created using Editor, commissioning.  Chart creation errors have been sought, but may not always  have been located. Such creation errors do not reflect on  the standard of medical care.

## 2017-03-09 ENCOUNTER — Telehealth: Payer: Self-pay | Admitting: *Deleted

## 2017-03-09 DIAGNOSIS — R739 Hyperglycemia, unspecified: Secondary | ICD-10-CM

## 2017-03-09 LAB — COMPLETE METABOLIC PANEL WITH GFR
AG RATIO: 1.4 (calc) (ref 1.0–2.5)
ALT: 28 U/L (ref 6–29)
AST: 28 U/L (ref 10–35)
Albumin: 4.4 g/dL (ref 3.6–5.1)
Alkaline phosphatase (APISO): 113 U/L (ref 33–130)
BUN/Creatinine Ratio: 34 (calc) — ABNORMAL HIGH (ref 6–22)
BUN: 28 mg/dL — ABNORMAL HIGH (ref 7–25)
CALCIUM: 10.2 mg/dL (ref 8.6–10.4)
CO2: 30 mmol/L (ref 20–32)
Chloride: 100 mmol/L (ref 98–110)
Creat: 0.82 mg/dL (ref 0.60–0.88)
GFR, EST AFRICAN AMERICAN: 76 mL/min/{1.73_m2} (ref >=60)
GFR, EST NON AFRICAN AMERICAN: 65 mL/min/{1.73_m2} (ref >=60)
GLOBULIN: 3.1 g/dL (ref 1.9–3.7)
Glucose, Bld: 319 mg/dL — ABNORMAL HIGH (ref 65–99)
POTASSIUM: 4.6 mmol/L (ref 3.5–5.3)
SODIUM: 137 mmol/L (ref 135–146)
Total Bilirubin: 0.3 mg/dL (ref 0.2–1.2)
Total Protein: 7.5 g/dL (ref 6.1–8.1)

## 2017-03-09 LAB — CBC WITH DIFFERENTIAL/PLATELET
BASOS PCT: 0.5 %
Basophils Absolute: 28 cells/uL (ref 0–200)
EOS PCT: 4.8 %
Eosinophils Absolute: 264 cells/uL (ref 15–500)
HCT: 39.4 % (ref 35.0–45.0)
Hemoglobin: 13.2 g/dL (ref 11.7–15.5)
Lymphs Abs: 1469 cells/uL (ref 850–3900)
MCH: 30.1 pg (ref 27.0–33.0)
MCHC: 33.5 g/dL (ref 32.0–36.0)
MCV: 90 fL (ref 80.0–100.0)
MONOS PCT: 10.8 %
MPV: 11 fL (ref 7.5–12.5)
Neutro Abs: 3146 cells/uL (ref 1500–7800)
Neutrophils Relative %: 57.2 %
PLATELETS: 221 10*3/uL (ref 140–400)
RBC: 4.38 10*6/uL (ref 3.80–5.10)
RDW: 12 % (ref 11.0–15.0)
TOTAL LYMPHOCYTE: 26.7 %
WBC: 5.5 10*3/uL (ref 3.8–10.8)
WBCMIX: 594 {cells}/uL (ref 200–950)

## 2017-03-09 LAB — C3 AND C4
C3 COMPLEMENT: 138 mg/dL
C4 Complement: 20 mg/dL

## 2017-03-09 LAB — ANTI-DNA ANTIBODY, DOUBLE-STRANDED: ds DNA Ab: 1 IU/mL

## 2017-03-09 LAB — SJOGRENS SYNDROME-B EXTRACTABLE NUCLEAR ANTIBODY: SSB (LA) (ENA) ANTIBODY, IGG: NEGATIVE AI

## 2017-03-09 LAB — ANTI-NUCLEAR AB-TITER (ANA TITER)

## 2017-03-09 LAB — SJOGRENS SYNDROME-A EXTRACTABLE NUCLEAR ANTIBODY: SSA (Ro) (ENA) Antibody, IgG: <1 AI

## 2017-03-09 LAB — ANA: Anti Nuclear Antibody(ANA): POSITIVE — AB

## 2017-03-09 NOTE — Telephone Encounter (Signed)
Patient daughter notified and agreed. Patient is NOT on Prednisone.  Scheduled appointment with Dr Mariea Clonts 03/26/17 and lab appointment on 03/22/17 Order placed.

## 2017-03-09 NOTE — Telephone Encounter (Signed)
We will need her to be seen sooner than that and have hba1c drawn before the appt. Is she currently on prednisone?

## 2017-03-09 NOTE — Progress Notes (Signed)
Monitor glucose levels

## 2017-03-09 NOTE — Telephone Encounter (Signed)
Patient daughter, Ariana [name] called and stated that patient saw Dr. Estanislado Pandy yesterday and labwork was done and patient's blood sugar was 319. Stated that the Dr. Marletta Lor referring to patient's diabetes but daughter stated that she has never been diagnosed with diabetes. Dr. Estanislado Pandy instructed her to call our office to let you know about this. Next appointment 04/23/17 with [name]. Please Advise.

## 2017-03-11 ENCOUNTER — Other Ambulatory Visit: Payer: Self-pay | Admitting: Internal Medicine

## 2017-03-11 DIAGNOSIS — K2100 Gastro-esophageal reflux disease with esophagitis, without bleeding: Secondary | ICD-10-CM

## 2017-03-15 ENCOUNTER — Other Ambulatory Visit: Payer: Self-pay | Admitting: Internal Medicine

## 2017-03-17 ENCOUNTER — Other Ambulatory Visit: Payer: Self-pay | Admitting: Internal Medicine

## 2017-03-19 ENCOUNTER — Other Ambulatory Visit: Payer: Self-pay | Admitting: Internal Medicine

## 2017-03-22 ENCOUNTER — Other Ambulatory Visit: Payer: Medicare Other

## 2017-03-22 DIAGNOSIS — R739 Hyperglycemia, unspecified: Secondary | ICD-10-CM | POA: Diagnosis not present

## 2017-03-23 LAB — HEMOGLOBIN A1C
Hgb A1c MFr Bld: 9.1 % of total Hgb — ABNORMAL HIGH (ref <5.7)
Mean Plasma Glucose: 214 (calc)
eAG (mmol/L): 11.9 (calc)

## 2017-03-26 ENCOUNTER — Telehealth: Payer: Self-pay

## 2017-03-26 ENCOUNTER — Ambulatory Visit: Payer: Medicare Other | Admitting: Internal Medicine

## 2017-03-26 NOTE — Telephone Encounter (Signed)
Spoke with daughter and advised lab results, about diabetes, daughter scheduled appt on Thursday 03/29/17 to discuss labs.

## 2017-03-26 NOTE — Telephone Encounter (Signed)
Patient's daughter called to cancel appointment today due to patient having a fall and being in severe pain today. Patient fell last night and landed hard on her bottom.  Pt stated that she did not feel like getting up and dressing due to pain. I urged patient to keep appointment due to possibly needing xrays of pelvis.   Patient declined to keep appointment. Pt urged to go to ED if pain becomes worse. Pt daughter verbalized understanding.

## 2017-03-28 ENCOUNTER — Encounter: Payer: Self-pay | Admitting: Internal Medicine

## 2017-03-29 ENCOUNTER — Ambulatory Visit (INDEPENDENT_AMBULATORY_CARE_PROVIDER_SITE_OTHER): Payer: Medicare Other | Admitting: Internal Medicine

## 2017-03-29 ENCOUNTER — Other Ambulatory Visit: Payer: Self-pay | Admitting: Internal Medicine

## 2017-03-29 ENCOUNTER — Encounter: Payer: Self-pay | Admitting: Internal Medicine

## 2017-03-29 VITALS — BP 120/80 | HR 66 | Temp 97.9°F | Wt 160.0 lb

## 2017-03-29 DIAGNOSIS — M3500 Sicca syndrome, unspecified: Secondary | ICD-10-CM | POA: Diagnosis not present

## 2017-03-29 DIAGNOSIS — M545 Low back pain, unspecified: Secondary | ICD-10-CM

## 2017-03-29 DIAGNOSIS — F0150 Vascular dementia without behavioral disturbance: Secondary | ICD-10-CM | POA: Diagnosis not present

## 2017-03-29 DIAGNOSIS — R42 Dizziness and giddiness: Secondary | ICD-10-CM

## 2017-03-29 DIAGNOSIS — E119 Type 2 diabetes mellitus without complications: Secondary | ICD-10-CM

## 2017-03-29 DIAGNOSIS — N3281 Overactive bladder: Secondary | ICD-10-CM | POA: Diagnosis not present

## 2017-03-29 DIAGNOSIS — M797 Fibromyalgia: Secondary | ICD-10-CM

## 2017-03-29 MED ORDER — SENNA 8.6 MG PO TABS: 1.0000 | ORAL_TABLET | Freq: Every day | ORAL | 0 refills | Status: DC

## 2017-03-29 MED ORDER — DULOXETINE HCL 60 MG PO CPEP: 60.0000 mg | ORAL_CAPSULE | Freq: Every day | ORAL | 3 refills | Status: DC

## 2017-03-29 MED ORDER — MIRABEGRON ER 25 MG PO TB24: 25.0000 mg | ORAL_TABLET | Freq: Every day | ORAL | 1 refills | Status: DC

## 2017-03-29 NOTE — Patient Instructions (Addendum)
Change to glucerna from boost due to sugar.   Reduce cough drops to 3 per day.   Decrease your sweets intake.   We'll order you a glucometer to check your sugar when you feel dizzy or sweaty.

## 2017-03-29 NOTE — Progress Notes (Signed)
Location:  [facility] clinic Provider:  Shivam Mestas L. Mariea Clonts, D.O., C.M.D.  Code Status: full code--have discussed importance of advance care planning documentation--pt not able to make these judgments with her moderate to severe dementia  Goals of Care:  Advanced Directives 02/15/2017  Does Patient Have a Medical Advance Directive? No  Type of Advance Directive -  Does patient want to make changes to medical advance directive? -  Copy of [place] in Chart? -  Would patient like information on creating a medical advance directive? -  Pre-existing out of facility DNR order (yellow form or pink MOST form) -    Chief Complaint  Patient presents with  . Medical Management of Chronic Issues    discuss diabetes  . ACP    HPI: Patient is a 84 y.o. female seen today for medical management of chronic diseases.    Pt had prediabetes in the past, but has more recently progressed into diabetic range.   Lab Results  Component Value Date   HGBA1C 9.1 (H) 03/22/2017  she has been eating tons of sweets and carbs that her caregiver cooks or buys for her like pancakes, ice cream.  We did some diabetes education today in reference to this.  Pt has huge med list with high risk for interactions so we chose not to start a pill for this.  No one is available to give insulin so that's not an option.  We also discussed meeting with Dr. Sabra Heck for diabetes education more on a Monday morning.  Otherwise, pt c/o her edentulous status, her distended appearing abdomen, her chronic pain, her worsening vision and her neuropathy.  Her memory is very poor and she repeats stories each visit.  Past Medical History:  Diagnosis Date  . Abnormality of gait   . Adenomatous polyp of colon 2002   60mm  . Allergic rhinitis   . Anxiety   . Anxiety and depression   . Chronic back pain   . Dementia without behavioral disturbance   . Depression   . Diverticulosis of colon   . Dry eye syndrome   . Dysphagia     . Dysthymic disorder   . Fibromyalgia   . GERD (gastroesophageal reflux disease)   . H/O hiatal hernia   . History of adverse drug reaction   . History of cerebrovascular disease 09/24/2014  . History of recurrent UTIs   . Hypertension, benign   . Irritable bowel syndrome   . Low back pain syndrome   . Memory loss   . Mitral valve prolapse   . Paroxysmal A-fib ([place])   . Peripheral neuropathy    "both feet and legs"  . Physical deconditioning   . Sjogren's syndrome ([place])   . Therapeutic opioid-induced constipation (OIC)   . Thyroid nodule   . Urinary incontinence     Past Surgical History:  Procedure Laterality Date  . ABDOMINAL HYSTERECTOMY  1967  . APPENDECTOMY    . CARDIAC CATHETERIZATION  02/17/2003   normal L main, LAD free of disease, Cfx free of disease, RCA free of disease (Dr. Loni Muse. [place])  . CATARACT EXTRACTION, BILATERAL    . CHOLECYSTECTOMY  2000  . COLONOSCOPY W/ BIOPSIES     multiple  . DENTAL SURGERY     multiple tooth extractions  . ESOPHAGOGASTRODUODENOSCOPY (EGD) WITH ESOPHAGEAL DILATION N/A 08/23/2012   Procedure: ESOPHAGOGASTRODUODENOSCOPY (EGD) WITH ESOPHAGEAL DILATION;  Surgeon: Milus Banister, MD;  Location: [facility];  Service: Endoscopy;  Laterality: N/A;  .  NASAL SEPTUM SURGERY  1980  . NM MYOCAR PERF WALL MOTION  2003   persantine - normal static and dynamic study w/apical thinning and presvered LV function, no ischemia  . SINUS EXPLORATION     ossifiying fibroma  . TEMPOROMANDIBULAR JOINT SURGERY  1986   Dr. Terence Lux  . TRANSTHORACIC ECHOCARDIOGRAM  2001   mild LVH, normal LV    Allergies  Allergen Reactions  . Banana Nausea And Vomiting  . Codeine Nausea Only    unless given with Phenergan  . Klonopin [Clonazepam] Other (See Comments)    Causes hallucination   . Meperidine Hcl Nausea Only    unless given with Phenergan  . Norflex [Orphenadrine Citrate] Nausea Only    Unless given with Phenergan  . Oxycodone-Acetaminophen Nausea  Only    unless given with phenergan  . Propoxyphene Hcl Nausea Only    unless given with phenergan  . Zoloft [Sertraline Hcl] Other (See Comments)    Caused lethargy  . Doxycycline Other (See Comments)    Unknown  . Naproxen Other (See Comments)  . Penicillins Other (See Comments)    Unknown  . Phenothiazines Other (See Comments)    Unknown  . Stelazine Other (See Comments)    Unknown  . Sulfamethoxazole-Trimethoprim Other (See Comments)    Unknown  . Tolectin [Tolmetin Sodium] Other (See Comments)    Unknown  . Tramadol Other (See Comments)    Unknown    Outpatient Encounter Medications as of 03/29/2017  Medication Sig  . ALPRAZolam (XANAX) 0.25 MG tablet TAKE 1 TABLET BY MOUTH TWICE DAILY AS NEEDED  . aspirin 325 MG tablet Take 1 tablet (325 mg total) by mouth daily.  . carboxymethylcellulose (REFRESH TEARS) 0.5 % SOLN Place 1 drop into both eyes 2 (two) times daily as needed (dry eyes).   . CARTIA XT 180 MG 24 hr capsule TAKE ONE CAPSULE BY MOUTH ONCE DAILY  . DULoxetine (CYMBALTA) 30 MG capsule TAKE 1 CAPSULE BY MOUTH ONCE DAILY FOR  BACK  PAIN  AND  DEPRESSION  . furosemide (LASIX) 40 MG tablet TAKE 1 TABLET BY MOUTH ONCE DAILY  . HYDROcodone-acetaminophen (NORCO/VICODIN) 5-325 MG tablet Take 1 tablet by mouth every 6 (six) hours as needed for moderate pain.  . hydroxychloroquine (PLAQUENIL) 200 MG tablet Take 200mg  po qd  . KLOR-CON M20 20 MEQ tablet TAKE 1 TABLET BY MOUTH ONCE DAILY  . lansoprazole (PREVACID) 30 MG capsule TAKE 1 CAPSULE BY MOUTH ONCE DAILY BEFORE BREAKFAST  . LINZESS 290 MCG CAPS capsule TAKE 1 CAPSULE BY MOUTH ONCE DAILY BEFORE BREAKFAST  . LYRICA 75 MG capsule TAKE 1 CAPSULE BY MOUTH TWICE DAILY AS NEEDED FOR PAIN  . memantine (NAMENDA) 10 MG tablet TAKE ONE TABLET BY MOUTH TWICE DAILY  . MYRBETRIQ 25 MG TB24 tablet TAKE ONE TABLET BY MOUTH ONCE DAILY  . pilocarpine (SALAGEN) 5 MG tablet Take 1 tablet (5 mg total) by mouth 2 (two) times daily.  .  polyethylene glycol (MIRALAX / GLYCOLAX) packet Take 17 g by mouth daily as needed for mild constipation.  . senna (SENOKOT) 8.6 MG TABS tablet Take 2 tablets (17.2 mg total) by mouth daily.  . sodium chloride (OCEAN) 0.65 % SOLN nasal spray Place 1 spray into the nose as needed for congestion.  . [DISCONTINUED] magic mouthwash w/lidocaine SOLN Take 5 mLs by mouth 4 (four) times daily.   No facility-administered encounter medications on file as of 03/29/2017.     Review of Systems:  Review  of Systems  Constitutional: Positive for malaise/fatigue. Negative for chills and fever.  HENT: Negative for congestion.        Dry mouth, daughter and caregiver note no improvement plus of minus immune therapy  Eyes: Positive for blurred vision.  Respiratory: Negative for cough and shortness of breath.   Cardiovascular: Negative for chest pain and palpitations.  Gastrointestinal: Positive for constipation. Negative for abdominal pain, blood in stool and melena.  Genitourinary: Negative for dysuria.       Urge and functional incontinence  Musculoskeletal: Positive for back pain, falls and joint pain.  Neurological: Positive for tingling and sensory change. Negative for dizziness, loss of consciousness and weakness.  Psychiatric/Behavioral: Positive for depression and memory loss.    Health Maintenance  Topic Date Due  . DEXA SCAN  07/04/1996  . MAMMOGRAM  02/03/2017  . TETANUS/TDAP  12/09/2023  . INFLUENZA VACCINE  Completed  . PNA vac Low Risk Adult  Completed    Physical Exam: Vitals:   03/29/17 1353  BP: 120/80  Pulse: 66  Temp: 97.9 F (36.6 C)  TempSrc: Oral  SpO2: 94%  Weight: 160 lb (72.6 kg)   Body mass index is 27.9 kg/m. Physical Exam  Constitutional: She appears well-developed. No distress.  Cardiovascular:  irreg irreg  Pulmonary/Chest: Effort normal and breath sounds normal. No respiratory distress.  Abdominal: Soft. Bowel sounds are normal. She exhibits distension.  There is no tenderness.  Musculoskeletal: Normal range of motion.  Walks with rollator walker  Neurological: She is alert.  Skin: Skin is warm and dry. There is pallor.  Psychiatric: She has a normal mood and affect.    Labs reviewed: Basic Metabolic Panel: Recent Labs    11/08/16 1629 11/29/16 1657 03/08/17 1431  NA 138 141 137  K 4.2 4.3 4.6  CL 102 104 100  CO2 28 27 30   GLUCOSE 171* 199* 319*  BUN 21 28* 28*  CREATININE 1.02* 0.78 0.82  CALCIUM 10.1 10.2 10.2   Liver Function Tests: Recent Labs    04/04/16 1536 11/08/16 1629 03/08/17 1431  AST 21 24 28   ALT 16 25 28   ALKPHOS 86  --   --   BILITOT 0.4 0.4 0.3  PROT 7.3 7.5 7.5  ALBUMIN 4.3  --   --    No results for input(s): LIPASE, AMYLASE in the last 8760 hours. No results for input(s): AMMONIA in the last 8760 hours. CBC: Recent Labs    04/04/16 1536 11/08/16 1629 11/29/16 1657 03/08/17 1431  WBC 5.7 6.8 6.9 5.5  NEUTROABS 2,907 3,971  --  3,146  HGB 13.0 13.7 14.0 13.2  HCT 40.0 41.3 42.1 39.4  MCV 92.0 89.0 91.3 90.0  PLT 234 245 246 221   Lipid Panel: No results for input(s): CHOL, HDL, LDLCALC, TRIG, CHOLHDL, LDLDIRECT in the last 8760 hours. Lab Results  Component Value Date   HGBA1C 9.1 (H) 03/22/2017    Procedures since last visit: Xr Lumbar Spine 2-3 Views  Result Date: 03/08/2017 Multilevel mild is spondylosis without any vertebral fracture. L5-S1 narrowing. Facet joint arthropathy noted. Impression: Spondylosis of lumbar spine   Assessment/Plan 1. Type 2 diabetes mellitus without complication, without long-term current use of insulin ([place]) - educated primarily on dietary changes for diabetes as a new medication or insulin did not seem like safe options given her living alone with only partial caregiver time - Ambulatory referral to [place] for diabetes diet education and mgt  2. Dizziness -ongoing, must hydrate and  change positions slowly -check glucose if dizzy spells -  Ambulatory referral to [place]  3. SJOGREN'S SYNDROME -I don't see a difference in her symptoms plus or minus the plaquenil and neither do her daughter or caregiver--pt was aware when it was stopped before and suddenly focused on that - Ambulatory referral to [place]  4. Vascular dementia without behavioral disturbance -progressing, ideally needs snf care, and doesn't have money for AL and has her home at current time that would have to be sold to get medicaid--family has been educated on the process several times -cont private caregiver - Ambulatory referral to [place]  5. Low back pain associated with a spinal disorder other than radiculopathy or spinal stenosis -ongoing, cont current pain regimen - Ambulatory referral to [place]  6. Overactive bladder - renew myrbetriq - mirabegron ER (MYRBETRIQ) 25 MG TB24 tablet; Take 1 tablet (25 mg total) by mouth daily.  Dispense: 90 tablet; Refill: 1 - Ambulatory referral to Home Health  7. Fibromyalgia - resume prior dose of cymbalta which is 90mg  total of cymbalta - DULoxetine (CYMBALTA) 60 MG capsule; Take 1 capsule (60 mg total) by mouth daily.  Dispense: 30 capsule; Refill: 3 - Ambulatory referral to Home Health  Labs/tests ordered:   Orders Placed This Encounter  Procedures  . Ambulatory referral to Home Health    Referral Priority:   Routine    Referral Type:   Home Health Care    Referral Reason:   Specialty Services Required    Requested Specialty:   [place]    Number of Visits Requested:   1   Next appt:  04/23/2017   Farouk Vivero L. Lanyla Costello, D.O. [facility] [address], [place] [address] Cell Phone (Mon-Fri 8am-5pm):  [phone] On Call:  [phone] & follow prompts after 5pm & weekends Office Phone:  [phone] Office Fax:  [phone]

## 2017-04-13 ENCOUNTER — Other Ambulatory Visit: Payer: Self-pay | Admitting: Internal Medicine

## 2017-04-13 ENCOUNTER — Telehealth: Payer: Self-pay | Admitting: *Deleted

## 2017-04-13 NOTE — Telephone Encounter (Signed)
Appointment Scheduled

## 2017-04-13 NOTE — Telephone Encounter (Signed)
Daughter stated that is going to be hard due to a lot going on in her household right now. Daughter stated that she just don't see how to make it work. Already bending in so many different directions. Stated that she knows what you said at the appointment but she can't do this for a whole week due to not driving and everything going on at her home. Caregiver can't do it either because it is not allowed.  Please Advise.

## 2017-04-13 NOTE — Telephone Encounter (Signed)
Daughter, Jeily called and stated that patient has been diagnosed Diabetic and In home teaching checking glucose was ordered. Home Health Nurse came out but after discussion they do not feel like this is going to benefit patient/daughter. Stated that she does not drive so she will have to call her husband to come home and pick her up then take her to [facility] to her mother's to check her blood sugar. Does not think this is beneficial. They are wondering if patient would be better taking medication to control blood sugar. Please Advise.

## 2017-04-13 NOTE — Telephone Encounter (Signed)
I wanted some baseline information with morning blood sugar checks for just one week so we could determine which medication would work best for her.  I avoided just starting something because of her incredibly long medication list.  We talked extensively at the appointment about cutting down on sweets and starchy foods (like pancakes, ice cream) that caregiver was making and buying for the patient.  Could Ariana [name] stay with her mother for a week to check her sugars so that we could get this information?

## 2017-04-13 NOTE — Telephone Encounter (Signed)
I understand the situation.  I think we also discussed having her see [facility] about this.  Can they do that?  The caregiver should be able to work on the diet portion with the education of home health.  I don't want to start a medicine at this time.

## 2017-04-13 NOTE — Telephone Encounter (Signed)
rx requested too soon.

## 2017-04-14 ENCOUNTER — Other Ambulatory Visit: Payer: Self-pay | Admitting: Internal Medicine

## 2017-04-16 ENCOUNTER — Telehealth: Payer: Self-pay | Admitting: *Deleted

## 2017-04-16 ENCOUNTER — Other Ambulatory Visit: Payer: Self-pay | Admitting: Internal Medicine

## 2017-04-16 NOTE — Telephone Encounter (Signed)
rx called into pharmacy

## 2017-04-16 NOTE — Telephone Encounter (Signed)
Ariana [name] from [facility] calling stating they where going to start services on Saturday 04/14/17 but pt refused services. They will try to start services again on 04/18/17. Per [facility] the daughter states she is disabled and can't help with her mother.

## 2017-04-16 NOTE — Telephone Encounter (Signed)
Noted. They should be calling the daughter not the patient.  She has dementia and will refuse at times.

## 2017-04-23 ENCOUNTER — Encounter: Payer: Self-pay | Admitting: Internal Medicine

## 2017-04-23 ENCOUNTER — Encounter (HOSPITAL_COMMUNITY): Payer: Self-pay | Admitting: *Deleted

## 2017-04-23 ENCOUNTER — Ambulatory Visit: Payer: Medicare Other | Admitting: Internal Medicine

## 2017-04-23 ENCOUNTER — Observation Stay (HOSPITAL_COMMUNITY)
Admission: EM | Admit: 2017-04-23 | Discharge: 2017-04-25 | Disposition: A | Discharge status: Home Health | Payer: Medicare Other | Attending: Family Medicine | Admitting: Family Medicine

## 2017-04-23 ENCOUNTER — Other Ambulatory Visit: Payer: Self-pay

## 2017-04-23 ENCOUNTER — Emergency Department (HOSPITAL_COMMUNITY): Payer: Medicare Other

## 2017-04-23 VITALS — BP 120/70 | HR 84 | Temp 97.2°F | Wt 158.0 lb

## 2017-04-23 DIAGNOSIS — I440 Atrioventricular block, first degree: Secondary | ICD-10-CM | POA: Insufficient documentation

## 2017-04-23 DIAGNOSIS — I5032 Chronic diastolic (congestive) heart failure: Secondary | ICD-10-CM | POA: Insufficient documentation

## 2017-04-23 DIAGNOSIS — M545 Low back pain, unspecified: Secondary | ICD-10-CM

## 2017-04-23 DIAGNOSIS — F0391 Unspecified dementia with behavioral disturbance: Secondary | ICD-10-CM | POA: Insufficient documentation

## 2017-04-23 DIAGNOSIS — Z880 Allergy status to penicillin: Secondary | ICD-10-CM | POA: Diagnosis not present

## 2017-04-23 DIAGNOSIS — I4891 Unspecified atrial fibrillation: Secondary | ICD-10-CM | POA: Diagnosis not present

## 2017-04-23 DIAGNOSIS — E860 Dehydration: Secondary | ICD-10-CM | POA: Diagnosis not present

## 2017-04-23 DIAGNOSIS — R296 Repeated falls: Secondary | ICD-10-CM | POA: Insufficient documentation

## 2017-04-23 DIAGNOSIS — Z79899 Other long term (current) drug therapy: Secondary | ICD-10-CM

## 2017-04-23 DIAGNOSIS — F0150 Vascular dementia without behavioral disturbance: Secondary | ICD-10-CM | POA: Diagnosis not present

## 2017-04-23 DIAGNOSIS — Z888 Allergy status to other drugs, medicaments and biological substances status: Secondary | ICD-10-CM | POA: Diagnosis not present

## 2017-04-23 DIAGNOSIS — K219 Gastro-esophageal reflux disease without esophagitis: Secondary | ICD-10-CM | POA: Insufficient documentation

## 2017-04-23 DIAGNOSIS — E119 Type 2 diabetes mellitus without complications: Secondary | ICD-10-CM

## 2017-04-23 DIAGNOSIS — I4510 Unspecified right bundle-branch block: Secondary | ICD-10-CM | POA: Diagnosis not present

## 2017-04-23 DIAGNOSIS — R069 Unspecified abnormalities of breathing: Secondary | ICD-10-CM | POA: Diagnosis not present

## 2017-04-23 DIAGNOSIS — Z886 Allergy status to analgesic agent status: Secondary | ICD-10-CM | POA: Diagnosis not present

## 2017-04-23 DIAGNOSIS — E1140 Type 2 diabetes mellitus with diabetic neuropathy, unspecified: Secondary | ICD-10-CM | POA: Insufficient documentation

## 2017-04-23 DIAGNOSIS — F0390 Unspecified dementia without behavioral disturbance: Secondary | ICD-10-CM | POA: Diagnosis not present

## 2017-04-23 DIAGNOSIS — R0602 Shortness of breath: Secondary | ICD-10-CM | POA: Diagnosis not present

## 2017-04-23 DIAGNOSIS — I110 Hypertensive heart disease with heart failure: Secondary | ICD-10-CM | POA: Diagnosis not present

## 2017-04-23 DIAGNOSIS — Z881 Allergy status to other antibiotic agents status: Secondary | ICD-10-CM | POA: Insufficient documentation

## 2017-04-23 DIAGNOSIS — Z885 Allergy status to narcotic agent status: Secondary | ICD-10-CM | POA: Insufficient documentation

## 2017-04-23 DIAGNOSIS — F329 Major depressive disorder, single episode, unspecified: Secondary | ICD-10-CM | POA: Diagnosis not present

## 2017-04-23 DIAGNOSIS — Z8679 Personal history of other diseases of the circulatory system: Secondary | ICD-10-CM

## 2017-04-23 DIAGNOSIS — Z882 Allergy status to sulfonamides status: Secondary | ICD-10-CM | POA: Diagnosis not present

## 2017-04-23 DIAGNOSIS — F341 Dysthymic disorder: Secondary | ICD-10-CM | POA: Diagnosis present

## 2017-04-23 DIAGNOSIS — R42 Dizziness and giddiness: Secondary | ICD-10-CM | POA: Insufficient documentation

## 2017-04-23 DIAGNOSIS — R1010 Upper abdominal pain, unspecified: Secondary | ICD-10-CM | POA: Diagnosis not present

## 2017-04-23 DIAGNOSIS — I480 Paroxysmal atrial fibrillation: Secondary | ICD-10-CM | POA: Insufficient documentation

## 2017-04-23 DIAGNOSIS — Z8673 Personal history of transient ischemic attack (TIA), and cerebral infarction without residual deficits: Secondary | ICD-10-CM | POA: Insufficient documentation

## 2017-04-23 DIAGNOSIS — F03A0 Unspecified dementia, mild, without behavioral disturbance, psychotic disturbance, mood disturbance, and anxiety: Secondary | ICD-10-CM | POA: Diagnosis present

## 2017-04-23 LAB — CBC WITH DIFFERENTIAL/PLATELET
Basophils Absolute: 0 10*3/uL (ref 0.0–0.1)
Basophils Relative: 0 %
Eosinophils Absolute: 0.2 10*3/uL (ref 0.0–0.7)
Eosinophils Relative: 3 %
HCT: 41 % (ref 36.0–46.0)
Hemoglobin: 13.2 g/dL (ref 12.0–15.0)
LYMPHS PCT: 34 %
Lymphs Abs: 2.5 10*3/uL (ref 0.7–4.0)
MCH: 30 pg (ref 26.0–34.0)
MCHC: 32.2 g/dL (ref 30.0–36.0)
MCV: 93.2 fL (ref 78.0–100.0)
MONO ABS: 0.9 10*3/uL (ref 0.1–1.0)
MONOS PCT: 12 %
Neutro Abs: 3.7 10*3/uL (ref 1.7–7.7)
Neutrophils Relative %: 51 %
Platelets: 218 10*3/uL (ref 150–400)
RBC: 4.4 MIL/uL (ref 3.87–5.11)
RDW: 13.7 % (ref 11.5–15.5)
WBC: 7.3 10*3/uL (ref 4.0–10.5)

## 2017-04-23 LAB — COMPREHENSIVE METABOLIC PANEL
ALT: 24 U/L (ref 14–54)
AST: 25 U/L (ref 15–41)
Albumin: 3.7 g/dL (ref 3.5–5.0)
Alkaline Phosphatase: 81 U/L (ref 38–126)
Anion gap: 12 (ref 5–15)
BILIRUBIN TOTAL: 0.7 mg/dL (ref 0.3–1.2)
BUN: 20 mg/dL (ref 6–20)
CALCIUM: 9.4 mg/dL (ref 8.9–10.3)
CO2: 24 mmol/L (ref 22–32)
Chloride: 104 mmol/L (ref 101–111)
Creatinine, Ser: 1.01 mg/dL — ABNORMAL HIGH (ref 0.44–1.00)
GFR calc Af Amer: 57 mL/min — ABNORMAL LOW (ref >60)
GFR, EST NON AFRICAN AMERICAN: 49 mL/min — AB (ref >60)
Glucose, Bld: 183 mg/dL — ABNORMAL HIGH (ref 65–99)
POTASSIUM: 4.2 mmol/L (ref 3.5–5.1)
Sodium: 140 mmol/L (ref 135–145)
TOTAL PROTEIN: 6.7 g/dL (ref 6.5–8.1)

## 2017-04-23 LAB — MAGNESIUM: Magnesium: 2 mg/dL (ref 1.7–2.4)

## 2017-04-23 LAB — I-STAT TROPONIN, ED: TROPONIN I, POC: 0 ng/mL (ref 0.00–0.08)

## 2017-04-23 LAB — TROPONIN I

## 2017-04-23 LAB — CBG MONITORING, ED: Glucose-Capillary: 139 mg/dL — ABNORMAL HIGH (ref 65–99)

## 2017-04-23 MED ORDER — PANTOPRAZOLE SODIUM 40 MG PO TBEC
40.0000 mg | DELAYED_RELEASE_TABLET | Freq: Every day | ORAL | Status: DC
  Administered 2017-04-24 – 2017-04-25 (×2): 40 mg via ORAL
  Filled 2017-04-23 (×2): qty 1

## 2017-04-23 MED ORDER — MEMANTINE HCL 10 MG PO TABS
10.0000 mg | ORAL_TABLET | Freq: Two times a day (BID) | ORAL | Status: DC
  Administered 2017-04-23 – 2017-04-25 (×5): 10 mg via ORAL
  Filled 2017-04-23 (×5): qty 1

## 2017-04-23 MED ORDER — BIOTENE DRY MOUTH MT LIQD: 15.0000 mL | Freq: Every day | OROMUCOSAL | Status: DC | PRN

## 2017-04-23 MED ORDER — ENOXAPARIN SODIUM 40 MG/0.4ML ~~LOC~~ SOLN
40.0000 mg | SUBCUTANEOUS | Status: DC
  Administered 2017-04-23 – 2017-04-24 (×2): 40 mg via SUBCUTANEOUS
  Filled 2017-04-23 (×2): qty 0.4

## 2017-04-23 MED ORDER — HYDROXYCHLOROQUINE SULFATE 200 MG PO TABS
200.0000 mg | ORAL_TABLET | Freq: Every day | ORAL | Status: DC
  Administered 2017-04-24 – 2017-04-25 (×2): 200 mg via ORAL
  Filled 2017-04-23 (×2): qty 1

## 2017-04-23 MED ORDER — INSULIN ASPART 100 UNIT/ML ~~LOC~~ SOLN
0.0000 [IU] | Freq: Three times a day (TID) | SUBCUTANEOUS | Status: DC
  Administered 2017-04-24: 2 [IU] via SUBCUTANEOUS
  Administered 2017-04-24: 5 [IU] via SUBCUTANEOUS
  Administered 2017-04-25: 2 [IU] via SUBCUTANEOUS
  Administered 2017-04-25 (×2): 3 [IU] via SUBCUTANEOUS

## 2017-04-23 MED ORDER — PREGABALIN 75 MG PO CAPS
75.0000 mg | ORAL_CAPSULE | Freq: Two times a day (BID) | ORAL | Status: DC
  Administered 2017-04-24 – 2017-04-25 (×5): 75 mg via ORAL
  Filled 2017-04-23 (×3): qty 1
  Filled 2017-04-23: qty 3
  Filled 2017-04-23: qty 1

## 2017-04-23 MED ORDER — SENNA 8.6 MG PO TABS
1.0000 | ORAL_TABLET | Freq: Every day | ORAL | Status: DC
  Administered 2017-04-23 – 2017-04-25 (×3): 8.6 mg via ORAL
  Filled 2017-04-23 (×4): qty 1

## 2017-04-23 MED ORDER — ACETAMINOPHEN 650 MG RE SUPP: 650.0000 mg | Freq: Four times a day (QID) | RECTAL | Status: DC | PRN

## 2017-04-23 MED ORDER — MIRABEGRON ER 25 MG PO TB24
25.0000 mg | ORAL_TABLET | Freq: Every day | ORAL | Status: DC
  Administered 2017-04-24 – 2017-04-25 (×2): 25 mg via ORAL
  Filled 2017-04-23 (×2): qty 1

## 2017-04-23 MED ORDER — POLYETHYLENE GLYCOL 3350 17 G PO PACK: 17.0000 g | PACK | Freq: Every day | ORAL | Status: DC | PRN

## 2017-04-23 MED ORDER — DILTIAZEM LOAD VIA INFUSION
10.0000 mg | Freq: Once | INTRAVENOUS | Status: AC
  Administered 2017-04-23: 10 mg via INTRAVENOUS
  Filled 2017-04-23: qty 10

## 2017-04-23 MED ORDER — FUROSEMIDE 40 MG PO TABS
40.0000 mg | ORAL_TABLET | Freq: Every day | ORAL | Status: DC
  Administered 2017-04-24 – 2017-04-25 (×2): 40 mg via ORAL
  Filled 2017-04-23 (×2): qty 1

## 2017-04-23 MED ORDER — DILTIAZEM HCL ER COATED BEADS 180 MG PO CP24
180.0000 mg | ORAL_CAPSULE | Freq: Every day | ORAL | Status: DC
  Administered 2017-04-24 – 2017-04-25 (×2): 180 mg via ORAL
  Filled 2017-04-23 (×2): qty 1

## 2017-04-23 MED ORDER — ALPRAZOLAM 0.25 MG PO TABS
0.2500 mg | ORAL_TABLET | Freq: Two times a day (BID) | ORAL | Status: DC | PRN
  Administered 2017-04-24 – 2017-04-25 (×2): 0.25 mg via ORAL
  Filled 2017-04-23 (×2): qty 1

## 2017-04-23 MED ORDER — HYDROCODONE-ACETAMINOPHEN 5-325 MG PO TABS: 1.0000 | ORAL_TABLET | Freq: Four times a day (QID) | ORAL | 0 refills | Status: DC | PRN

## 2017-04-23 MED ORDER — GI COCKTAIL ~~LOC~~
30.0000 mL | Freq: Once | ORAL | Status: AC
  Administered 2017-04-23: 30 mL via ORAL
  Filled 2017-04-23: qty 30

## 2017-04-23 MED ORDER — IPRATROPIUM BROMIDE 0.02 % IN SOLN: 0.5000 mg | Freq: Four times a day (QID) | RESPIRATORY_TRACT | Status: DC | PRN

## 2017-04-23 MED ORDER — DULOXETINE HCL 30 MG PO CPEP
30.0000 mg | ORAL_CAPSULE | Freq: Every day | ORAL | Status: DC
  Administered 2017-04-24 – 2017-04-25 (×2): 30 mg via ORAL
  Filled 2017-04-23 (×2): qty 1

## 2017-04-23 MED ORDER — POTASSIUM CHLORIDE CRYS ER 20 MEQ PO TBCR
20.0000 meq | EXTENDED_RELEASE_TABLET | Freq: Every day | ORAL | Status: DC
  Administered 2017-04-24: 20 meq via ORAL
  Filled 2017-04-23: qty 1

## 2017-04-23 MED ORDER — LEVALBUTEROL HCL 0.63 MG/3ML IN NEBU: 0.6300 mg | INHALATION_SOLUTION | Freq: Four times a day (QID) | RESPIRATORY_TRACT | Status: DC | PRN

## 2017-04-23 MED ORDER — LINACLOTIDE 145 MCG PO CAPS
290.0000 ug | ORAL_CAPSULE | Freq: Every day | ORAL | Status: DC
  Administered 2017-04-24 – 2017-04-25 (×2): 290 ug via ORAL
  Filled 2017-04-23: qty 2
  Filled 2017-04-23: qty 1
  Filled 2017-04-23 (×3): qty 2

## 2017-04-23 MED ORDER — INSULIN ASPART 100 UNIT/ML ~~LOC~~ SOLN: 0.0000 [IU] | Freq: Every day | SUBCUTANEOUS | Status: DC

## 2017-04-23 MED ORDER — PILOCARPINE HCL 5 MG PO TABS
5.0000 mg | ORAL_TABLET | Freq: Two times a day (BID) | ORAL | Status: DC
  Administered 2017-04-23 – 2017-04-25 (×5): 5 mg via ORAL
  Filled 2017-04-23 (×5): qty 1

## 2017-04-23 MED ORDER — CARBOXYMETHYLCELLULOSE SODIUM 0.5 % OP SOLN: 1.0000 [drp] | Freq: Every day | OPHTHALMIC | Status: DC | PRN

## 2017-04-23 MED ORDER — SALINE SPRAY 0.65 % NA SOLN
1.0000 | Freq: Every day | NASAL | Status: DC | PRN
  Filled 2017-04-23: qty 44

## 2017-04-23 MED ORDER — ACETAMINOPHEN 325 MG PO TABS
650.0000 mg | ORAL_TABLET | Freq: Four times a day (QID) | ORAL | Status: DC | PRN
  Administered 2017-04-25: 650 mg via ORAL
  Filled 2017-04-23 (×2): qty 2

## 2017-04-23 MED ORDER — DILTIAZEM HCL-DEXTROSE 100-5 MG/100ML-% IV SOLN (PREMIX)
5.0000 mg/h | INTRAVENOUS | Status: DC
  Administered 2017-04-23: 5 mg/h via INTRAVENOUS
  Filled 2017-04-23: qty 100

## 2017-04-23 MED ORDER — DULOXETINE HCL 60 MG PO CPEP
60.0000 mg | ORAL_CAPSULE | Freq: Every day | ORAL | Status: DC
  Administered 2017-04-24 – 2017-04-25 (×3): 60 mg via ORAL
  Filled 2017-04-23 (×3): qty 1

## 2017-04-23 MED ORDER — HYDROCODONE-ACETAMINOPHEN 5-325 MG PO TABS
1.0000 | ORAL_TABLET | Freq: Two times a day (BID) | ORAL | Status: DC
  Administered 2017-04-23 – 2017-04-25 (×5): 1 via ORAL
  Filled 2017-04-23 (×5): qty 1

## 2017-04-23 MED ORDER — ASPIRIN 325 MG PO TABS
325.0000 mg | ORAL_TABLET | Freq: Every day | ORAL | Status: DC
  Administered 2017-04-24 – 2017-04-25 (×2): 325 mg via ORAL
  Filled 2017-04-23 (×2): qty 1

## 2017-04-23 MED ORDER — POLYVINYL ALCOHOL 1.4 % OP SOLN
1.0000 [drp] | OPHTHALMIC | Status: DC | PRN
  Filled 2017-04-23 (×2): qty 15

## 2017-04-23 NOTE — ED Notes (Signed)
Family in waiting room 

## 2017-04-23 NOTE — Progress Notes (Signed)
Location:  [facility] clinic Provider:  Tiffany L. Mariea Clonts, D.O., C.M.D.  Code Status: FC Goals of Care:  Advanced Directives 02/15/2017  Does Patient Have a Medical Advance Directive? No  Type of Advance Directive -  Does patient want to make changes to medical advance directive? -  Copy of [place] in Chart? -  Would patient like information on creating a medical advance directive? -  Pre-existing out of facility DNR order (yellow form or pink MOST form) -     Chief Complaint  Patient presents with  . Medical Management of Chronic Issues    26mth follow-up    HPI: Patient is a 84 y.o. female seen today for medical management of chronic diseases. She has a pertinent history of Afib on diltiazem, new onset DM II, CVA, HTN, and MVP. Presents to clinic today with daughter and caregiver. Patient is a poor historian secondary to previous CVA and dementia. When she is asked about how she feels she immediately complains of feeling tired and dizzy. She has started to nap more recently and has experienced some dizziness and is tired continuously-per caregiver and daughter. When speaking with the patient during HPI it was noticed that she had some mild SOB and appear to be not feeling well. Upon ascultation of the patient she was found to be in Afib via ascultation, an EKG in office was ordered. Which demonstrated: Afib with RVR at a rate 142 Compared to previous EKG 12 lead in Jan at [facility] which demonstrated Sinus Rhythm of 71 bpm.   Post this finding the caregiver reported that she noticed her HR was elevated this morning when she took her vitals, but she thought the machine was off. The machine read 140-160's with a low BP of 89/69.   DM II:  There have been issues with getting her blood sugar checked and with education about this. The daughter declined the home health company that was sent out ([facility]), and the other company ([facility] she liked came out, but their education  was twice daily for a week and the daughter is unable to be there for that education. She also has not received the glucometer either. So, they have not been able to check her blood sugar over the last month. But have implemented diet changes, with Glucerna for supplements, as well as reduced the amount of sweets and carbs she was eating.   Past Medical History:  Diagnosis Date  . Abnormality of gait   . Adenomatous polyp of colon 2002   23mm  . Allergic rhinitis   . Anxiety   . Anxiety and depression   . Chronic back pain   . Dementia without behavioral disturbance   . Depression   . Diverticulosis of colon   . Dry eye syndrome   . Dysphagia   . Dysthymic disorder   . Fibromyalgia   . GERD (gastroesophageal reflux disease)   . H/O hiatal hernia   . History of adverse drug reaction   . History of cerebrovascular disease 09/24/2014  . History of recurrent UTIs   . Hypertension, benign   . Irritable bowel syndrome   . Low back pain syndrome   . Memory loss   . Mitral valve prolapse   . Paroxysmal A-fib ([place])   . Peripheral neuropathy    "both feet and legs"  . Physical deconditioning   . Sjogren's syndrome ([place])   . Therapeutic opioid-induced constipation (OIC)   . Thyroid nodule   .  Urinary incontinence     Past Surgical History:  Procedure Laterality Date  . ABDOMINAL HYSTERECTOMY  1967  . APPENDECTOMY    . CARDIAC CATHETERIZATION  02/17/2003   normal L main, LAD free of disease, Cfx free of disease, RCA free of disease (Dr. Loni Muse. Little)  . CATARACT EXTRACTION, BILATERAL    . CHOLECYSTECTOMY  2000  . COLONOSCOPY W/ BIOPSIES     multiple  . DENTAL SURGERY     multiple tooth extractions  . ESOPHAGOGASTRODUODENOSCOPY (EGD) WITH ESOPHAGEAL DILATION N/A 08/23/2012   Procedure: ESOPHAGOGASTRODUODENOSCOPY (EGD) WITH ESOPHAGEAL DILATION;  Surgeon: Milus Banister, MD;  Location: [facility];  Service: Endoscopy;  Laterality: N/A;  . NASAL SEPTUM SURGERY  1980  . NM MYOCAR PERF  WALL MOTION  2003   persantine - normal static and dynamic study w/apical thinning and presvered LV function, no ischemia  . SINUS EXPLORATION     ossifiying fibroma  . TEMPOROMANDIBULAR JOINT SURGERY  1986   Dr. Terence Lux  . TRANSTHORACIC ECHOCARDIOGRAM  2001   mild LVH, normal LV    Allergies  Allergen Reactions  . Banana Nausea And Vomiting  . Codeine Nausea Only    unless given with Phenergan  . Klonopin [Clonazepam] Other (See Comments)    Causes hallucination   . Meperidine Hcl Nausea Only    unless given with Phenergan  . Norflex [Orphenadrine Citrate] Nausea Only    Unless given with Phenergan  . Oxycodone-Acetaminophen Nausea Only    unless given with phenergan  . Propoxyphene Hcl Nausea Only    unless given with phenergan  . Zoloft [Sertraline Hcl] Other (See Comments)    Caused lethargy  . Doxycycline Other (See Comments)    Unknown  . Naproxen Other (See Comments)  . Penicillins Other (See Comments)    Unknown  . Phenothiazines Other (See Comments)    Unknown  . Stelazine Other (See Comments)    Unknown  . Sulfamethoxazole-Trimethoprim Other (See Comments)    Unknown  . Tolectin [Tolmetin Sodium] Other (See Comments)    Unknown  . Tramadol Other (See Comments)    Unknown    Outpatient Encounter Medications as of 04/23/2017  Medication Sig  . ALPRAZolam (XANAX) 0.25 MG tablet TAKE 1 TABLET BY MOUTH TWICE DAILY AS NEEDED  . aspirin 325 MG tablet Take 1 tablet (325 mg total) by mouth daily.  . carboxymethylcellulose (REFRESH TEARS) 0.5 % SOLN Place 1 drop into both eyes 2 (two) times daily as needed (dry eyes).   . CARTIA XT 180 MG 24 hr capsule TAKE ONE CAPSULE BY MOUTH ONCE DAILY  . DULoxetine (CYMBALTA) 30 MG capsule TAKE 1 CAPSULE BY MOUTH ONCE DAILY FOR  BACK  PAIN  AND  DEPRESSION  . DULoxetine (CYMBALTA) 60 MG capsule Take 1 capsule (60 mg total) by mouth daily.  . furosemide (LASIX) 40 MG tablet TAKE 1 TABLET BY MOUTH ONCE DAILY  .  HYDROcodone-acetaminophen (NORCO/VICODIN) 5-325 MG tablet Take 1 tablet by mouth every 6 (six) hours as needed for moderate pain.  . hydroxychloroquine (PLAQUENIL) 200 MG tablet Take 200mg  po qd  . KLOR-CON M20 20 MEQ tablet TAKE 1 TABLET BY MOUTH ONCE DAILY  . lansoprazole (PREVACID) 30 MG capsule TAKE 1 CAPSULE BY MOUTH ONCE DAILY BEFORE BREAKFAST  . LINZESS 290 MCG CAPS capsule TAKE 1 CAPSULE BY MOUTH ONCE DAILY BEFORE BREAKFAST  . LYRICA 75 MG capsule TAKE 1 CAPSULE BY MOUTH TWICE DAILY AS NEEDED FOR PAIN  . memantine (NAMENDA) 10  MG tablet TAKE ONE TABLET BY MOUTH TWICE DAILY  . mirabegron ER (MYRBETRIQ) 25 MG TB24 tablet Take 1 tablet (25 mg total) by mouth daily.  . pilocarpine (SALAGEN) 5 MG tablet Take 1 tablet (5 mg total) by mouth 2 (two) times daily.  . polyethylene glycol (MIRALAX / GLYCOLAX) packet Take 17 g by mouth daily as needed for mild constipation.  . senna (SENOKOT) 8.6 MG TABS tablet Take 1 tablet (8.6 mg total) by mouth daily.  . sodium chloride (OCEAN) 0.65 % SOLN nasal spray Place 1 spray into the nose as needed for congestion.  . [DISCONTINUED] magic mouthwash w/lidocaine SOLN Take 5 mLs by mouth 4 (four) times daily.   No facility-administered encounter medications on file as of 04/23/2017.     Review of Systems:  Review of Systems  Constitutional: Positive for malaise/fatigue. Negative for chills, fever and weight loss.  Respiratory: Positive for shortness of breath. Negative for cough.   Cardiovascular: Negative for chest pain, palpitations and leg swelling.  Musculoskeletal: Positive for back pain, falls and joint pain.  Neurological: Positive for dizziness, tingling, sensory change and weakness. Negative for headaches.  Psychiatric/Behavioral: Positive for depression and memory loss.    Health Maintenance  Topic Date Due  . FOOT EXAM  07/04/1939  . OPHTHALMOLOGY EXAM  07/04/1939  . URINE MICROALBUMIN  07/04/1939  . DEXA SCAN  07/04/1996  . MAMMOGRAM   02/03/2017  . HEMOGLOBIN A1C  09/19/2017  . TETANUS/TDAP  12/09/2023  . INFLUENZA VACCINE  Completed  . PNA vac Low Risk Adult  Completed    Physical Exam: Vitals:   04/23/17 1512  BP: 120/70  Pulse: 84  Temp: (!) 97.2 F (36.2 C)  TempSrc: Oral  SpO2: 94%  Weight: 158 lb (71.7 kg)   Body mass index is 27.55 kg/m. Physical Exam  Constitutional: She appears well-developed and well-nourished.  HENT:  Head: Normocephalic.  Cardiovascular: Intact distal pulses. An irregularly irregular rhythm present.  Pulmonary/Chest: Effort normal and breath sounds normal.  Abdominal: Soft. Bowel sounds are normal.  Neurological: She is alert.  Skin: Skin is warm and dry. There is pallor.  Psychiatric: She has a normal mood and affect.  Vitals reviewed.   Labs reviewed: Basic Metabolic Panel: Recent Labs    11/08/16 1629 11/29/16 1657 03/08/17 1431  NA 138 141 137  K 4.2 4.3 4.6  CL 102 104 100  CO2 28 27 30   GLUCOSE 171* 199* 319*  BUN 21 28* 28*  CREATININE 1.02* 0.78 0.82  CALCIUM 10.1 10.2 10.2   Liver Function Tests: Recent Labs    11/08/16 1629 03/08/17 1431  AST 24 28  ALT 25 28  BILITOT 0.4 0.3  PROT 7.5 7.5   No results for input(s): LIPASE, AMYLASE in the last 8760 hours. No results for input(s): AMMONIA in the last 8760 hours. CBC: Recent Labs    11/08/16 1629 11/29/16 1657 03/08/17 1431  WBC 6.8 6.9 5.5  NEUTROABS 3,971  --  3,146  HGB 13.7 14.0 13.2  HCT 41.3 42.1 39.4  MCV 89.0 91.3 90.0  PLT 245 246 221   Lipid Panel: No results for input(s): CHOL, HDL, LDLCALC, TRIG, CHOLHDL, LDLDIRECT in the last 8760 hours. Lab Results  Component Value Date   HGBA1C 9.1 (H) 03/22/2017    Procedures since last visit: No results found.  Assessment/Plan 1. Atrial Fibrillation with RVR (HCC) Today patient complains of dizziness, shortness of breath, as well as excessive tiredness that has been on going  for the last few weeks. Which family attributed to  her "new onset DM"  Auscultation revealed rapid irregularly irregular heart rate.  EKG in office demonstrates:   Undetermined   Tachycardia rate of 142 -Left axis.   -Nonspecific ST depression   +   Negative precordial T-waves  -Nondiagnostic  -Possible  Anteroseptal  ischemia.   Compared to previous EKG 12 lead in Jan at [facility] which demonstrated Sinus Rhythm of 71 bpm.   Patient and family were made aware of findings and educated about the need for her to go to the hospital for work-up and management of this rhythm. EMS was called to transport the patient to [facility] for management.    - EKG 12-Lead  2. Type 2 diabetes mellitus without complication, without long-term current use of insulin ([place]) She has new onset DM according to her most recent A1c. She was in the office today for follow up about getting meter. They also opted for her to be diet controlled as she already takes several medications and did not want to add another if they could help it.   - CBC with Differential/Platelet; Future - COMPLETE METABOLIC PANEL WITH GFR; Future - Hemoglobin A1c; Future  3. Vascular dementia without behavioral disturbance She has a private care giver, but SNF would be ideal for her. Family continues to decline this transition.   - CBC with Differential/Platelet; Future - COMPLETE METABOLIC PANEL WITH GFR; Future - Hemoglobin A1c; Future - Lipid panel; Future  4. Low back pain associated with a spinal disorder other than radiculopathy or spinal stenosis This is on-going will continue her current pain medication regimen.  - HYDROcodone-acetaminophen (NORCO/VICODIN) 5-325 MG tablet; Take 1 tablet by mouth every 6 (six) hours as needed for moderate pain.  Dispense: 120 tablet; Refill: 0  Labs/tests ordered: I have already ordered this as a future order in the computer.  Next appt:  04/30/2017   Karen Kays, RN, DNP [employer] [facility] [phone], [place] [address] Cell Phone (Mon-Fri 8am-5pm):  [phone] On Call:  [phone] & follow prompts after 5pm & weekends Office Phone:  [phone] Office Fax:  [phone]

## 2017-04-23 NOTE — H&P (Signed)
History and Physical    Ariana [name] CZY:[id number] DOB: 03/05/1929 DOA: 04/23/2017  Referring MD/NP/PA: Dr. Blanchie Dessert PCP: Gayland Curry, DO  Patient coming from: PCP office  Chief Complaint: Dizziness shortness of breath  I have personally briefly reviewed patient's old medical records in [place]   HPI: Ariana [name] is a 84 y.o. female with medical history significant of HTN, PAF not on anticoagulation, MVP, CVA, dementia, Sjogren's syndrome, and DM type II; who presents from [facility] care after being found to be in A. fib with RVR.  Patient reports feeling more short of breath intermittently at rest and with exertion over the last few weeks.  She reports having this funny feeling that she describes like dizziness.  Associated symptoms include reports of fall 3 weeks ago, fatigue, and epigastric abdominal discomfort.  Denies having any focal weakness, loss of consciousness, cough, wheezing, orthopnea, leg swelling, vomiting, or dysuria.  She has recently been diagnosed with diabetes last month, and there had been some mixup in her getting her diabetic medications and supplies. She had gone to her PCPs office today for routine follow-up appointment and will seem to be in atrial fibrillation with RVR with heart rate up to 142.  Patient has a previous history of atrial fibrillation in the past, but due to patient's increased bowel rest and never been started on anticoagulation and is followed by Dr. Debara Pickett of cardiology.  ED Course: Upon admission into the emergency department patient was noted to be afebrile in A. fib with RVR with a heart rate up to 145.  Patient was placed on Cardizem drip.  Labs were relatively unremarkable.  Magnesium lab was ordered and pending.  Review of Systems  Constitutional: Positive for malaise/fatigue. Negative for chills, fever and weight loss.  HENT: Negative for ear pain and nosebleeds.   Eyes: Negative for photophobia and pain.    Respiratory: Positive for shortness of breath. Negative for cough and wheezing.   Cardiovascular: Positive for palpitations. Negative for leg swelling.  Gastrointestinal: Positive for abdominal pain and heartburn. Negative for constipation, diarrhea, nausea and vomiting.  Genitourinary: Negative for dysuria and hematuria.  Musculoskeletal: Positive for falls. Negative for myalgias.  Skin: Negative for itching and rash.  Neurological: Positive for dizziness. Negative for focal weakness and loss of consciousness.  Endo/Heme/Allergies: Negative for polydipsia. Does not bruise/bleed easily.  Psychiatric/Behavioral: Positive for memory loss. Negative for substance abuse.    Past Medical History:  Diagnosis Date  . Abnormality of gait   . Adenomatous polyp of colon 2002   43mm  . Allergic rhinitis   . Anxiety   . Anxiety and depression   . Chronic back pain   . Dementia without behavioral disturbance   . Depression   . Diverticulosis of colon   . Dry eye syndrome   . Dysphagia   . Dysthymic disorder   . Fibromyalgia   . GERD (gastroesophageal reflux disease)   . H/O hiatal hernia   . History of adverse drug reaction   . History of cerebrovascular disease 09/24/2014  . History of recurrent UTIs   . Hypertension, benign   . Irritable bowel syndrome   . Low back pain syndrome   . Memory loss   . Mitral valve prolapse   . Paroxysmal A-fib ([address])   . Peripheral neuropathy    "both feet and legs"  . Physical deconditioning   . Sjogren's syndrome ([place])   . Therapeutic opioid-induced constipation (OIC)   .  Thyroid nodule   . Urinary incontinence     Past Surgical History:  Procedure Laterality Date  . ABDOMINAL HYSTERECTOMY  1967  . APPENDECTOMY    . CARDIAC CATHETERIZATION  02/17/2003   normal L main, LAD free of disease, Cfx free of disease, RCA free of disease (Dr. Loni Muse. [place])  . CATARACT EXTRACTION, BILATERAL    . CHOLECYSTECTOMY  2000  . COLONOSCOPY W/ BIOPSIES     multiple   . DENTAL SURGERY     multiple tooth extractions  . ESOPHAGOGASTRODUODENOSCOPY (EGD) WITH ESOPHAGEAL DILATION N/A 08/23/2012   Procedure: ESOPHAGOGASTRODUODENOSCOPY (EGD) WITH ESOPHAGEAL DILATION;  Surgeon: Milus Banister, MD;  Location: [facility] ENDOSCOPY;  Service: Endoscopy;  Laterality: N/A;  . NASAL SEPTUM SURGERY  1980  . NM MYOCAR PERF WALL MOTION  2003   persantine - normal static and dynamic study w/apical thinning and presvered LV function, no ischemia  . SINUS EXPLORATION     ossifiying fibroma  . TEMPOROMANDIBULAR JOINT SURGERY  1986   Dr. Terence Lux  . TRANSTHORACIC ECHOCARDIOGRAM  2001   mild LVH, normal LV     reports that she has quit smoking. she has never used smokeless tobacco. She reports that she does not drink alcohol or use drugs.  Allergies  Allergen Reactions  . Banana Nausea And Vomiting  . Codeine Nausea Only    unless given with Phenergan  . Klonopin [Clonazepam] Other (See Comments)    Causes hallucination   . Meperidine Hcl Nausea Only    unless given with Phenergan  . Norflex [Orphenadrine Citrate] Nausea Only    Unless given with Phenergan  . Oxycodone-Acetaminophen Nausea Only    unless given with phenergan  . Propoxyphene Hcl Nausea Only    unless given with phenergan  . Zoloft [Sertraline Hcl] Other (See Comments)    Caused lethargy  . Doxycycline Other (See Comments)    Unknown  . Naproxen Other (See Comments)  . Penicillins Other (See Comments)    Unknown  . Phenothiazines Other (See Comments)    Unknown  . Stelazine Other (See Comments)    Unknown  . Sulfamethoxazole-Trimethoprim Other (See Comments)    Unknown  . Tolectin [Tolmetin Sodium] Other (See Comments)    Unknown  . Tramadol Other (See Comments)    Unknown    Family History  Problem Relation Age of Onset  . Heart disease Father        heart attack  . Pneumonia Father   . Heart attack Mother   . Hypertension Mother   . Colon cancer Sister   . Kidney disease Daughter    . Asthma Daughter   . Arthritis Daughter 36       osteo,  . Heart disease Son 17       stage 3 CHF(Diastolic /Systolic)  . Throat cancer Brother   . Hypertension Maternal Grandmother     Prior to Admission medications   Medication Sig Start Date End Date Taking? Authorizing Provider  ALPRAZolam Duanne Moron) 0.25 MG tablet TAKE 1 TABLET BY MOUTH TWICE DAILY AS NEEDED 04/16/17   Reed, Tiffany L, DO  aspirin 325 MG tablet Take 1 tablet (325 mg total) by mouth daily. 07/28/14   Reyne Dumas, MD  carboxymethylcellulose (REFRESH TEARS) 0.5 % SOLN Place 1 drop into both eyes 2 (two) times daily as needed (dry eyes).     [provider]  CARTIA XT 180 MG 24 hr capsule TAKE ONE CAPSULE BY MOUTH ONCE DAILY 01/15/17   Hilty,  Nadean Corwin, MD  DULoxetine (CYMBALTA) 30 MG capsule TAKE 1 CAPSULE BY MOUTH ONCE DAILY FOR  BACK  PAIN  AND  DEPRESSION 02/02/17   Reed, Tiffany L, DO  DULoxetine (CYMBALTA) 60 MG capsule Take 1 capsule (60 mg total) by mouth daily. 03/29/17   Reed, Tiffany L, DO  furosemide (LASIX) 40 MG tablet TAKE 1 TABLET BY MOUTH ONCE DAILY 02/09/17   Reed, Tiffany L, DO  HYDROcodone-acetaminophen (NORCO/VICODIN) 5-325 MG tablet Take 1 tablet by mouth every 6 (six) hours as needed for moderate pain. 04/23/17   Reed, Tiffany L, DO  hydroxychloroquine (PLAQUENIL) 200 MG tablet Take 200mg  po qd 03/08/17   Bo Merino, MD  KLOR-CON M20 20 MEQ tablet TAKE 1 TABLET BY MOUTH ONCE DAILY 03/30/17   Reed, Tiffany L, DO  lansoprazole (PREVACID) 30 MG capsule TAKE 1 CAPSULE BY MOUTH ONCE DAILY BEFORE BREAKFAST 03/12/17   Reed, Tiffany L, DO  LINZESS 290 MCG CAPS capsule TAKE 1 CAPSULE BY MOUTH ONCE DAILY BEFORE BREAKFAST 04/13/17   Gatha Mayer, MD  LYRICA 75 MG capsule TAKE 1 CAPSULE BY MOUTH TWICE DAILY AS NEEDED FOR PAIN 02/09/17   Reed, Tiffany L, DO  memantine (NAMENDA) 10 MG tablet TAKE ONE TABLET BY MOUTH TWICE DAILY 05/30/16   Reed, Tiffany L, DO  mirabegron ER (MYRBETRIQ) 25 MG TB24 tablet  Take 1 tablet (25 mg total) by mouth daily. 03/29/17   Reed, Tiffany L, DO  pilocarpine (SALAGEN) 5 MG tablet Take 1 tablet (5 mg total) by mouth 2 (two) times daily. 11/08/16   Bo Merino, MD  polyethylene glycol (MIRALAX / GLYCOLAX) packet Take 17 g by mouth daily as needed for mild constipation.    [provider]  senna (SENOKOT) 8.6 MG TABS tablet Take 1 tablet (8.6 mg total) by mouth daily. 03/29/17   Reed, Tiffany L, DO  sodium chloride (OCEAN) 0.65 % SOLN nasal spray Place 1 spray into the nose as needed for congestion. 08/23/12   Hongalgi, Lenis Dickinson, MD  magic mouthwash w/lidocaine SOLN Take 5 mLs by mouth 4 (four) times daily.  09/06/15  [provider]    Physical Exam:  Constitutional: Elderly female in NAD, calm, comfortable Vitals:   04/23/17 1733 04/23/17 1739 04/23/17 1756  BP: (!) 153/71    Pulse: 60 (!) 145   Resp: 20 20   Temp: 98.8 F (37.1 C) 98.1 F (36.7 C)   TempSrc: Oral Oral   SpO2: 99%    Weight:   71.7 kg (158 lb)  Height:   5' 3.5" (1.613 m)   Eyes: PERRL, lids and conjunctivae normal ENMT: Mucous membranes are moist. Posterior pharynx clear of any exudate or lesions. Normal dentition.  Neck: normal, supple, no masses, no thyromegaly, and no JVD Respiratory: clear to auscultation bilaterally, no wheezing, no crackles. Normal respiratory effort. No accessory muscle use.  Cardiovascular: Irregular irregular, no murmurs / rubs / gallops. No extremity edema. 2+ pedal pulses. No carotid bruits.  Abdomen: no tenderness, no masses palpated. No hepatosplenomegaly. Bowel sounds positive.  Musculoskeletal: no clubbing / cyanosis. No joint deformity upper and lower extremities. Good ROM, no contractures. Normal muscle tone.  Skin: no rashes, lesions, ulcers. No induration Neurologic: CN 2-12 grossly intact. Sensation intact, DTR normal. Strength 5/5 in all 4.  Psychiatric: Normal judgment and insight. Alert and oriented x 3. Normal mood.      Labs on Admission: I have personally reviewed following labs and imaging studies  CBC: Recent Labs  Lab 04/23/17 1758  WBC 7.3  NEUTROABS 3.7  HGB 13.2  HCT 41.0  MCV 93.2  PLT 960   Basic Metabolic Panel: Recent Labs  Lab 04/23/17 1758  NA 140  K 4.2  CL 104  CO2 24  GLUCOSE 183*  BUN 20  CREATININE 1.01*  CALCIUM 9.4   GFR: Estimated Creatinine Clearance: 39.1 mL/min (A) (by C-G formula based on SCr of 1.01 mg/dL (H)). Liver Function Tests: Recent Labs  Lab 04/23/17 1758  AST 25  ALT 24  ALKPHOS 81  BILITOT 0.7  PROT 6.7  ALBUMIN 3.7   No results for input(s): LIPASE, AMYLASE in the last 168 hours. No results for input(s): AMMONIA in the last 168 hours. Coagulation Profile: No results for input(s): INR, PROTIME in the last 168 hours. Cardiac Enzymes: No results for input(s): CKTOTAL, CKMB, CKMBINDEX, TROPONINI in the last 168 hours. BNP (last 3 results) No results for input(s): PROBNP in the last 8760 hours. HbA1C: No results for input(s): HGBA1C in the last 72 hours. CBG: No results for input(s): GLUCAP in the last 168 hours. Lipid Profile: No results for input(s): CHOL, HDL, LDLCALC, TRIG, CHOLHDL, LDLDIRECT in the last 72 hours. Thyroid Function Tests: No results for input(s): TSH, T4TOTAL, FREET4, T3FREE, THYROIDAB in the last 72 hours. Anemia Panel: No results for input(s): VITAMINB12, FOLATE, FERRITIN, TIBC, IRON, RETICCTPCT in the last 72 hours. Urine analysis:    Component Value Date/Time   COLORURINE YELLOW 04/04/2016 1536   APPEARANCEUR CLEAR 04/04/2016 1536   APPEARANCEUR Clear 07/08/2015 1241   LABSPEC 1.020 10/06/2016 1451   PHURINE 5.0 10/06/2016 1451   GLUCOSEU NEGATIVE 10/06/2016 1451   GLUCOSEU NEGATIVE 07/14/2011 1312   HGBUR NEGATIVE 10/06/2016 1451   BILIRUBINUR neg 02/15/2017 1640   BILIRUBINUR Negative 07/08/2015 1241   KETONESUR NEGATIVE 10/06/2016 1451   PROTEINUR neg 02/15/2017 1640   PROTEINUR NEGATIVE  10/06/2016 1451   UROBILINOGEN 0.2 02/15/2017 1640   UROBILINOGEN 0.2 10/06/2016 1451   NITRITE neg 02/15/2017 1640   NITRITE NEGATIVE 10/06/2016 1451   LEUKOCYTESUR Trace (A) 02/15/2017 1640   LEUKOCYTESUR Negative 07/08/2015 1241   Sepsis Labs: No results found for this or any previous visit (from the past 240 hour(s)).   Radiological Exams on Admission: Dg Chest Port 1 View  Result Date: 04/23/2017 CLINICAL DATA:  Shortness of Breath EXAM: PORTABLE CHEST 1 VIEW COMPARISON:  11/29/2016 FINDINGS: Cardiac shadow is stable. Aortic calcifications are again seen. The lungs are well aerated bilaterally. No focal infiltrate is seen. Minimal platelike atelectasis is noted in the left base. IMPRESSION: Minimal left basilar atelectasis. Electronically Signed   By: Inez Catalina M.D.   On: 04/23/2017 18:16    EKG: Independently reviewed.  Atrial fibrillation with RVR at 132 bpm  Assessment/Plan Atrial fibrillation with RVR: Acute.  Patient complaining of dizziness shortness of breath found to have heart rates up to 145 on admission.  Patient placed on a Cardizem drip with improvement of heart rates.  Patient has a Chadsvasc score of 6, but due to falls risk was never placed on anticoagulation.  Last echocardiogram showing EF of 55-60% with grade 2 diastolic dysfunction in  05/5407.  Patient followed by Dr. Debara Pickett of cardiology. - Admit to a stepdown bed - Focused atrial fibrillation order set initiated - Continue Cardizem drip - Follow-up magnesium level - Add on TSH - Check echocardiogram - Message sent for cardiology to eval in a.m.  Diabetes mellitus type 2: Patient presents with a blood glucose  of 183 on admission.  Recent hemoglobin A1c noted to be 9.1 on 03/22/17. - Hypoglycemic protocols  - CBGs q. before meals with moderate SSI  Sjogren's syndrome - Continue pilocarpine and hydroxychloroquine  GERD: Suspect possibly related to Sjogren's syndrome. - GI cocktail - Continue pharmacy  substitution Protonix  Chronic pain/fibromyalgia - Continue Lyrica, hydrocodone prn pain  Anxiety/depression - Continue Cymbalta, and Xanax prn  History of CVA - Continue aspirin  Dementia - Continue Namenda    DVT prophylaxis: lovenox Code Status:  Full Family Communication: Discussed plan of care with the patient and family present at bedside Disposition Plan: Likely discharge home in 1-2 days Consults called: none  Admission status: observation  Norval Morton MD [facility] Pager [phone]   If 7PM-7AM, please contact night-coverage [email or web address] Password [facility]  04/23/2017, 7:46 PM

## 2017-04-23 NOTE — ED Provider Notes (Signed)
[facility] EMERGENCY DEPARTMENT Provider Note   CSN: [id number] Arrival date & time: 04/23/17  1727     History   Chief Complaint Chief Complaint  Patient presents with  . Dizziness  . Shortness of Breath    HPI Ariana [name] is a 84 y.o. female.  Patient is an 84 year old female with a history of Sjogren's syndrome, hypertension, paroxysmal atrial fibrillation not on anticoagulation, dementia, CVA, recurrent UTIs, peripheral neuropathy and mitral valve prolapse presenting today from [place] when she was in the office for a checkup and found to be in A. fib RVR.  Patient has been complaining of feeling generally weak, shortness of breath over the last few weeks.  She was recently diagnosed with diabetes and had recently started therapy.  However today she is complaining of some discomfort in her epigastric area and states she just feels generally weak and run down.  She denies any nausea or vomiting.  She has not had cough or fever.  She cannot recall when the last time she was in atrial fibrillation but she does take all of her medications as prescribed.   The history is provided by the patient and medical records.  Dizziness  Quality:  Lightheadedness Associated symptoms: shortness of breath   Shortness of Breath     Past Medical History:  Diagnosis Date  . Abnormality of gait   . Adenomatous polyp of colon 2002   58mm  . Allergic rhinitis   . Anxiety   . Anxiety and depression   . Chronic back pain   . Dementia without behavioral disturbance   . Depression   . Diverticulosis of colon   . Dry eye syndrome   . Dysphagia   . Dysthymic disorder   . Fibromyalgia   . GERD (gastroesophageal reflux disease)   . H/O hiatal hernia   . History of adverse drug reaction   . History of cerebrovascular disease 09/24/2014  . History of recurrent UTIs   . Hypertension, benign   . Irritable bowel syndrome   . Low back pain syndrome   . Memory  loss   . Mitral valve prolapse   . Paroxysmal A-fib ([place])   . Peripheral neuropathy    "both feet and legs"  . Physical deconditioning   . Sjogren's syndrome ([place])   . Therapeutic opioid-induced constipation (OIC)   . Thyroid nodule   . Urinary incontinence     Patient Active Problem List   Diagnosis Date Noted  . Sjogren's disease ([place]) 04/03/2016  . Primary osteoarthritis of both hands 04/03/2016  . High risk medication use 04/03/2016  . Senile dementia, with behavioral disturbance 08/16/2015  . Swelling 08/16/2015  . Diastolic dysfunction 84/16/6063  . Hypertonicity, bladder 07/08/2015  . Arterial hypotension   . Bradycardia 06/29/2015  . Chronic lower back pain 06/29/2015  . PAF (paroxysmal atrial fibrillation) ([place]) 04/18/2015  . Dehydration 04/18/2015  . Dementia without behavioral disturbance 04/18/2015  . TIA (transient ischemic attack) 12/17/2014  . Chest pain 12/16/2014  . Acute encephalopathy 12/04/2014  . Fall   . AKI (acute kidney injury) ([place]) 12/03/2014  . History of cerebrovascular disease 09/24/2014  . Falls frequently 07/28/2014  . Infarction of parietal lobe   . Mild dementia   . CVA (cerebral infarction) 07/26/2014  . Constipation 04/15/2014  . Mixed stress and urge urinary incontinence 11/03/2013  . Therapeutic opioid induced constipation 11/03/2013  . Hemorrhoid 11/03/2013  . Low back pain associated with a spinal disorder  other than radiculopathy or spinal stenosis 11/03/2013  . Protein-calorie malnutrition, severe ([place]) 07/22/2013  . UTI (lower urinary tract infection) 07/20/2013  . Prolonged QT interval 07/20/2013  . Osteopenia 07/17/2013  . Palpitations 06/30/2013  . Hereditary and idiopathic peripheral neuropathy 05/22/2013  . Abnormality of gait 12/05/2012  . Headache(784.0) 09/05/2012  . Multinodular thyroid 01/15/2012  . Neck pain 01/15/2012  . Hypercholesterolemia 07/08/2010  . Tear film insufficiency 07/06/2009  . [place]  SYNDROME 07/06/2009  . Urge urinary incontinence 01/06/2009  . COLONIC POLYPS, ADENOMATOUS, HX OF 04/15/2008  . MITRAL VALVE PROLAPSE 11/05/2007  . ANXIETY DEPRESSION 07/02/2007  . Mononeuritis 07/02/2007  . HYPERTENSION, BENIGN 07/02/2007  . GERD 07/02/2007  . Irritable bowel syndrome 07/02/2007  . Fibromyalgia 07/02/2007    Past Surgical History:  Procedure Laterality Date  . ABDOMINAL HYSTERECTOMY  1967  . APPENDECTOMY    . CARDIAC CATHETERIZATION  02/17/2003   normal L main, LAD free of disease, Cfx free of disease, RCA free of disease (Dr. Loni Muse. [place])  . CATARACT EXTRACTION, BILATERAL    . CHOLECYSTECTOMY  2000  . COLONOSCOPY W/ BIOPSIES     multiple  . DENTAL SURGERY     multiple tooth extractions  . ESOPHAGOGASTRODUODENOSCOPY (EGD) WITH ESOPHAGEAL DILATION N/A 08/23/2012   Procedure: ESOPHAGOGASTRODUODENOSCOPY (EGD) WITH ESOPHAGEAL DILATION;  Surgeon: Milus Banister, MD;  Location: [facility] END[facility]COPY;  Service: Endoscopy;  Laterality: N/A;  . NASAL SEPTUM SURGERY  1980  . NM MYOCAR PERF WALL MOTION  2003   persantine - normal static and dynamic study w/apical thinning and presvered LV function, no ischemia  . SINUS EXPLORATION     ossifiying fibroma  . TEMPOROMANDIBULAR JOINT SURGERY  1986   Dr. Terence Lux  . TRANSTHORACIC ECHOCARDIOGRAM  2001   mild LVH, normal LV    OB History    No data available       Home Medications    Prior to Admission medications   Medication Sig Start Date End Date Taking? Authorizing Provider  ALPRAZolam Duanne Moron) 0.25 MG tablet TAKE 1 TABLET BY MOUTH TWICE DAILY AS NEEDED 04/16/17   Reed, Tiffany L, DO  aspirin 325 MG tablet Take 1 tablet (325 mg total) by mouth daily. 07/28/14   Reyne Dumas, MD  carboxymethylcellulose (REFRESH TEARS) 0.5 % SOLN Place 1 drop into both eyes 2 (two) times daily as needed (dry eyes).     [provider]  CARTIA XT 180 MG 24 hr capsule TAKE ONE CAPSULE BY MOUTH ONCE DAILY 01/15/17   Hilty, Nadean Corwin, MD    DULoxetine (CYMBALTA) 30 MG capsule TAKE 1 CAPSULE BY MOUTH ONCE DAILY FOR  BACK  PAIN  AND  DEPRESSION 02/02/17   Reed, Tiffany L, DO  DULoxetine (CYMBALTA) 60 MG capsule Take 1 capsule (60 mg total) by mouth daily. 03/29/17   Reed, Tiffany L, DO  furosemide (LASIX) 40 MG tablet TAKE 1 TABLET BY MOUTH ONCE DAILY 02/09/17   Reed, Tiffany L, DO  HYDROcodone-acetaminophen (NORCO/VICODIN) 5-325 MG tablet Take 1 tablet by mouth every 6 (six) hours as needed for moderate pain. 04/23/17   Reed, Tiffany L, DO  hydroxychloroquine (PLAQUENIL) 200 MG tablet Take 200mg  po qd 03/08/17   Bo Merino, MD  KLOR-CON M20 20 MEQ tablet TAKE 1 TABLET BY MOUTH ONCE DAILY 03/30/17   Reed, Tiffany L, DO  lansoprazole (PREVACID) 30 MG capsule TAKE 1 CAPSULE BY MOUTH ONCE DAILY BEFORE BREAKFAST 03/12/17   Reed, Tiffany L, DO  LINZESS 290 MCG CAPS capsule  TAKE 1 CAPSULE BY MOUTH ONCE DAILY BEFORE BREAKFAST 04/13/17   Gatha Mayer, MD  LYRICA 75 MG capsule TAKE 1 CAPSULE BY MOUTH TWICE DAILY AS NEEDED FOR PAIN 02/09/17   Reed, Tiffany L, DO  memantine (NAMENDA) 10 MG tablet TAKE ONE TABLET BY MOUTH TWICE DAILY 05/30/16   Reed, Tiffany L, DO  mirabegron ER (MYRBETRIQ) 25 MG TB24 tablet Take 1 tablet (25 mg total) by mouth daily. 03/29/17   Reed, Tiffany L, DO  pilocarpine (SALAGEN) 5 MG tablet Take 1 tablet (5 mg total) by mouth 2 (two) times daily. 11/08/16   Bo Merino, MD  polyethylene glycol (MIRALAX / GLYCOLAX) packet Take 17 g by mouth daily as needed for mild constipation.    [provider]  senna (SENOKOT) 8.6 MG TABS tablet Take 1 tablet (8.6 mg total) by mouth daily. 03/29/17   Reed, Tiffany L, DO  sodium chloride (OCEAN) 0.65 % SOLN nasal spray Place 1 spray into the nose as needed for congestion. 08/23/12   Hongalgi, Lenis Dickinson, MD  magic mouthwash w/lidocaine SOLN Take 5 mLs by mouth 4 (four) times daily.  09/06/15  [provider]    Family History Family History  Problem Relation Age of  Onset  . Heart disease Father        heart attack  . Pneumonia Father   . Heart attack Mother   . Hypertension Mother   . Colon cancer Sister   . Kidney disease Daughter   . Asthma Daughter   . Arthritis Daughter 57       osteo,  . Heart disease Son 72       stage 3 CHF(Diastolic /Systolic)  . Throat cancer Brother   . Hypertension Maternal Grandmother     Social History Social History   Tobacco Use  . Smoking status: Former Research scientist (life sciences)  . Smokeless tobacco: Never Used  . Tobacco comment: Quit at age 64   Substance Use Topics  . Alcohol use: No  . Drug use: No     Allergies   Banana; Codeine; Klonopin [clonazepam]; Meperidine hcl; Norflex [orphenadrine citrate]; Oxycodone-acetaminophen; Propoxyphene hcl; Zoloft [sertraline hcl]; Doxycycline; Naproxen; Penicillins; Phenothiazines; Stelazine; Sulfamethoxazole-trimethoprim; Tolectin [tolmetin sodium]; and Tramadol   Review of Systems Review of Systems  Respiratory: Positive for shortness of breath.   Neurological: Positive for dizziness.  All other systems reviewed and are negative.    Physical Exam Updated Vital Signs BP (!) 153/71 (BP Location: Right Arm)   Pulse (!) 145   Temp 98.1 F (36.7 C) (Oral)   Resp 20   Ht 5' 3.5" (1.613 m)   Wt 71.7 kg (158 lb)   SpO2 99%   BMI 27.55 kg/m   Physical Exam  Constitutional: She is oriented to person, place, and time. She appears well-developed and well-nourished. No distress.  HENT:  Head: Normocephalic and atraumatic.  Mouth/Throat: Oropharynx is clear and moist.  Eyes: Conjunctivae and EOM are normal. Pupils are equal, round, and reactive to light.  Neck: Normal range of motion. Neck supple.  Cardiovascular: Intact distal pulses. An irregularly irregular rhythm present. Tachycardia present.  No murmur heard. Pulmonary/Chest: Effort normal and breath sounds normal. No respiratory distress. She has no wheezes. She has no rales.  Abdominal: Soft. She exhibits no  distension. There is no tenderness. There is no rebound and no guarding.  Musculoskeletal: Normal range of motion. She exhibits no edema or tenderness.  Neurological: She is alert and oriented to person, place, and time.  Skin: Skin is warm and dry. No rash noted. No erythema.  Psychiatric: She has a normal mood and affect. Her behavior is normal.  Nursing note and vitals reviewed.    ED Treatments / Results  Labs (all labs ordered are listed, but only abnormal results are displayed) Labs Reviewed  COMPREHENSIVE METABOLIC PANEL - Abnormal; Notable for the following components:      Result Value   Glucose, Bld 183 (*)    Creatinine, Ser 1.01 (*)    GFR calc non Af Amer 49 (*)    GFR calc Af Amer 57 (*)    All other components within normal limits  CBC WITH DIFFERENTIAL/PLATELET  I-STAT TROPONIN, ED    EKG  EKG Interpretation  Date/Time:  Monday April 23 2017 17:46:14 EDT Ventricular Rate:  132 PR Interval:    QRS Duration: 90 QT Interval:  324 QTC Calculation: 480 R Axis:   -62 Text Interpretation:  recurrent  Atrial fibrillation with rapid ventricular response Left axis deviation Possible Anterior infarct , age undetermined Confirmed by Blanchie Dessert ([id number]) on 04/23/2017 6:07:17 PM       Radiology Dg Chest Port 1 View  Result Date: 04/23/2017 CLINICAL DATA:  Shortness of Breath EXAM: PORTABLE CHEST 1 VIEW COMPARISON:  11/29/2016 FINDINGS: Cardiac shadow is stable. Aortic calcifications are again seen. The lungs are well aerated bilaterally. No focal infiltrate is seen. Minimal platelike atelectasis is noted in the left base. IMPRESSION: Minimal left basilar atelectasis. Electronically Signed   By: Inez Catalina M.D.   On: 04/23/2017 18:16    Procedures Procedures (including critical care time)  Medications Ordered in ED Medications  diltiazem (CARDIZEM) 1 mg/mL load via infusion 10 mg (not administered)    And  diltiazem (CARDIZEM) 100 mg in dextrose 5% 166mL (1  mg/mL) infusion (not administered)     Initial Impression / Assessment and Plan / ED Course  I have reviewed the triage vital signs and the nursing notes.  Pertinent labs & imaging results that were available during my care of the patient were reviewed by me and considered in my medical decision making (see chart for details).     Patient being sent from [facility] with evidence of A. fib RVR.  Patient has a prior history of paroxysmal atrial fibrillation but has not had any episodes for quite some time.  She does take Cartia XL but does not take anticoagulation.  Patient has been at the doctor intermittently in the last month for recent diagnosis of diabetes and prior appointments her heart rate was within normal limits.  Patient is complaining of feeling discomfort in her epigastric area as well as some heart racing and shortness of breath.  Patient's heart rate here is 145.  EKG confirms A. fib with RVR. Patient does not show significant symptoms of CHF at this time.  CBC, CMP, BNP, troponin, chest x-ray pending.  Patient started on Cardizem IV bolus and drip for rate control.  7:41 PM Patient's labs are reassuring.  X-ray within normal limits.  Currently on Cardizem drip of 5 with heart rates now improved between the 80s and 90s.  Will admit for further care.  Per daughter pt is not on anticoagulation due to falls in the past.  Takes ASA daily.  CRITICAL CARE Performed by: Bralynn Donado Total critical care time: 30 minutes Critical care time was exclusive of separately billable procedures and treating other patients. Critical care was necessary to treat or prevent imminent or life-threatening deterioration.  Critical care was time spent personally by me on the following activities: development of treatment plan with patient and/or surrogate as well as nursing, discussions with consultants, evaluation of patient's response to treatment, examination of patient, obtaining history  from patient or surrogate, ordering and performing treatments and interventions, ordering and review of laboratory studies, ordering and review of radiographic studies, pulse oximetry and re-evaluation of patient's condition.   Final Clinical Impressions(s) / ED Diagnoses   Final diagnoses:  Atrial fibrillation with RVR [facility])    ED Discharge Orders    None       Blanchie Dessert, MD 04/23/17 1943

## 2017-04-23 NOTE — ED Notes (Signed)
Collected gold top and sent to main lab for tsh blood test.

## 2017-04-23 NOTE — ED Triage Notes (Signed)
Pt here from [facility]'s office for dizziness, racing heart and sob x several weeks, but that has increased over the last 3 days.  HR 130's to 140's.  CBG 130, O2 97% RA, rr 20, bp 128/92. Pt ao x 3 per [facility].

## 2017-04-24 ENCOUNTER — Other Ambulatory Visit (HOSPITAL_COMMUNITY): Payer: Medicare Other

## 2017-04-24 ENCOUNTER — Other Ambulatory Visit: Payer: Self-pay

## 2017-04-24 ENCOUNTER — Observation Stay (HOSPITAL_COMMUNITY): Payer: Medicare Other

## 2017-04-24 ENCOUNTER — Encounter (HOSPITAL_COMMUNITY): Payer: Self-pay | Admitting: General Practice

## 2017-04-24 DIAGNOSIS — F0390 Unspecified dementia without behavioral disturbance: Secondary | ICD-10-CM

## 2017-04-24 DIAGNOSIS — E119 Type 2 diabetes mellitus without complications: Secondary | ICD-10-CM

## 2017-04-24 DIAGNOSIS — Z8679 Personal history of other diseases of the circulatory system: Secondary | ICD-10-CM

## 2017-04-24 DIAGNOSIS — I4891 Unspecified atrial fibrillation: Secondary | ICD-10-CM | POA: Diagnosis not present

## 2017-04-24 DIAGNOSIS — I471 Supraventricular tachycardia: Secondary | ICD-10-CM

## 2017-04-24 DIAGNOSIS — R109 Unspecified abdominal pain: Secondary | ICD-10-CM | POA: Diagnosis not present

## 2017-04-24 DIAGNOSIS — I480 Paroxysmal atrial fibrillation: Secondary | ICD-10-CM

## 2017-04-24 DIAGNOSIS — R0602 Shortness of breath: Secondary | ICD-10-CM | POA: Diagnosis not present

## 2017-04-24 DIAGNOSIS — R140 Abdominal distension (gaseous): Secondary | ICD-10-CM

## 2017-04-24 DIAGNOSIS — R42 Dizziness and giddiness: Secondary | ICD-10-CM | POA: Diagnosis not present

## 2017-04-24 DIAGNOSIS — E860 Dehydration: Secondary | ICD-10-CM | POA: Diagnosis not present

## 2017-04-24 LAB — BASIC METABOLIC PANEL
Anion gap: 11 (ref 5–15)
BUN: 15 mg/dL (ref 6–20)
CHLORIDE: 104 mmol/L (ref 101–111)
CO2: 23 mmol/L (ref 22–32)
Calcium: 9 mg/dL (ref 8.9–10.3)
Creatinine, Ser: 0.83 mg/dL (ref 0.44–1.00)
GFR calc Af Amer: >60 mL/min (ref >60)
GFR calc non Af Amer: >60 mL/min (ref >60)
GLUCOSE: 147 mg/dL — AB (ref 65–99)
POTASSIUM: 4 mmol/L (ref 3.5–5.1)
Sodium: 138 mmol/L (ref 135–145)

## 2017-04-24 LAB — CBC
HEMATOCRIT: 40.4 % (ref 36.0–46.0)
HEMOGLOBIN: 13.2 g/dL (ref 12.0–15.0)
MCH: 30.2 pg (ref 26.0–34.0)
MCHC: 32.7 g/dL (ref 30.0–36.0)
MCV: 92.4 fL (ref 78.0–100.0)
PLATELETS: 205 10*3/uL (ref 150–400)
RBC: 4.37 MIL/uL (ref 3.87–5.11)
RDW: 13.8 % (ref 11.5–15.5)
WBC: 7.2 10*3/uL (ref 4.0–10.5)

## 2017-04-24 LAB — GLUCOSE, CAPILLARY
Glucose-Capillary: 207 mg/dL — ABNORMAL HIGH (ref 65–99)
Glucose-Capillary: 131 mg/dL — ABNORMAL HIGH (ref 65–99)
Glucose-Capillary: 177 mg/dL — ABNORMAL HIGH (ref 65–99)

## 2017-04-24 LAB — CBG MONITORING, ED: GLUCOSE-CAPILLARY: 131 mg/dL — AB (ref 65–99)

## 2017-04-24 LAB — MAGNESIUM: MAGNESIUM: 2 mg/dL (ref 1.7–2.4)

## 2017-04-24 LAB — TROPONIN I
Troponin I: <0.03 ng/mL (ref <0.03)
Troponin I: <0.03 ng/mL (ref <0.03)

## 2017-04-24 LAB — TSH: TSH: 0.92 u[IU]/mL (ref 0.350–4.500)

## 2017-04-24 LAB — MRSA PCR SCREENING: MRSA by PCR: NEGATIVE

## 2017-04-24 MED ORDER — METOPROLOL TARTRATE 5 MG/5ML IV SOLN
5.0000 mg | Freq: Once | INTRAVENOUS | Status: AC
  Administered 2017-04-24: 5 mg via INTRAVENOUS

## 2017-04-24 MED ORDER — IOPAMIDOL (ISOVUE-300) INJECTION 61%
INTRAVENOUS | Status: AC
  Administered 2017-04-24: 100 mL
  Filled 2017-04-24: qty 100

## 2017-04-24 MED ORDER — IOPAMIDOL (ISOVUE-300) INJECTION 61%
INTRAVENOUS | Status: AC
  Filled 2017-04-24: qty 30

## 2017-04-24 NOTE — Consult Note (Addendum)
Cardiology Consultation:   Patient ID: Ariana [name]; [id number]; 01-24-1930   Admit date: 04/23/2017 Date of Consult: 04/24/2017  Primary Care Provider: Gayland Curry, DO Primary Cardiologist: Dr. Debara Pickett  Patient Profile:   Ariana [name] is a 84 y.o. female with a hx of HTN, paroxysmal Afib not on anticoagulation secondary to frequent falls, MVP, CVA with possible cardioembolic etiology, advancing dementia, Sjorgren's syndrome and DM type II who is being seen today for the evaluation of atrial fibrillation with rapid ventricular response at the request of Dr. Horris Latino.  History of Present Illness:   Ariana [name] with a history stated above who was seen at [facility] for a routine PCP office visit on 04/22/2017 and found to be in atrial fibrillation with RVR. She reports feeling intermittently more short of breath at rest and with exertion over the last several weeks with an associated recent fall sustained approximately 3 weeks ago with no reported injury per chart review. History is unfortunately difficult to obtain secondary to advancing dementia. There is currently no family in the room for assistance. Most of her current complaints are centered around abdominal fullness and mild abdominal discomfort.  She does however, deny chest pain, orthopnea, LE swelling, PND palpitations, syncope, nausea/vomiting and diaphoresis.  Upon admission to the emergency department, her heart rate was noted to be up as high as 145 bpm.  A chest x-ray was completed which showed minimal left basilar atelectasis and essentially unremarkable. She was placed on a Cardizem gtt, which has now been discontinued secondary to regulated heart rate (sustaining in the 60s and 70s). Troponin levels were drawn which have been negative x2.  An EKG was performed which showed atrial fibrillation with rapid ventricular response with a heart rate at 137bpm. There are no ischemic ST-T wave changes.  Given her ongoing  abdominal symptoms, she is scheduled for a CT abdomen and pelvis.  She was admitted per hospitalist service.  Of note, she is a patient of Dr. Debara Pickett, last seen in the office for routine follow up on 02/26/2017.  At that time, she was found to be in NSR with first-degree AV block and incomplete right bundle branch block, along with nonspecific T-wave changes.  Per chart review, it appears that she has struggled for some time with advancing dementia, frequent falls, fluctuating weights, and medication compliance secondary to advancing dementia and frequent GI complaints. During our interview, she stated that she continues to live at home by herself with frequent contacts by her son-in-law who helps with her medications.  She also states that she has daily weekday aid assistance from 9 AM to 5 PM.   Cardiology was consulted to assist with A fib RVR  Past Medical History:  Diagnosis Date  . Abnormality of gait   . Adenomatous polyp of colon 2002   36mm  . Allergic rhinitis   . Anxiety   . Anxiety and depression   . Chronic back pain   . Dementia without behavioral disturbance   . Depression   . Diverticulosis of colon   . Dry eye syndrome   . Dysphagia   . Dysthymic disorder   . Fibromyalgia   . GERD (gastroesophageal reflux disease)   . H/O hiatal hernia   . History of adverse drug reaction   . History of cerebrovascular disease 09/24/2014  . History of recurrent UTIs   . Hypertension, benign   . Irritable bowel syndrome   . Low back pain syndrome   .  Memory loss   . Mitral valve prolapse   . Paroxysmal A-fib ([place])   . Peripheral neuropathy    "both feet and legs"  . Physical deconditioning   . Sjogren's syndrome ([place])   . Therapeutic opioid-induced constipation (OIC)   . Thyroid nodule   . Urinary incontinence     Past Surgical History:  Procedure Laterality Date  . ABDOMINAL HYSTERECTOMY  1967  . APPENDECTOMY    . CARDIAC CATHETERIZATION  02/17/2003   normal L main, LAD free  of disease, Cfx free of disease, RCA free of disease (Dr. Loni Muse. [place])  . CATARACT EXTRACTION, BILATERAL    . CHOLECYSTECTOMY  2000  . COLONOSCOPY W/ BIOPSIES     multiple  . DENTAL SURGERY     multiple tooth extractions  . ESOPHAGOGASTRODUODENOSCOPY (EGD) WITH ESOPHAGEAL DILATION N/A 08/23/2012   Procedure: ESOPHAGOGASTRODUODENOSCOPY (EGD) WITH ESOPHAGEAL DILATION;  Surgeon: Milus Banister, MD;  Location: [facility] ENDOSCOPY;  Service: Endoscopy;  Laterality: N/A;  . NASAL SEPTUM SURGERY  1980  . NM MYOCAR PERF WALL MOTION  2003   persantine - normal static and dynamic study w/apical thinning and presvered LV function, no ischemia  . SINUS EXPLORATION     ossifiying fibroma  . TEMPOROMANDIBULAR JOINT SURGERY  1986   Dr. Terence Lux  . TRANSTHORACIC ECHOCARDIOGRAM  2001   mild LVH, normal LV     Prior to Admission medications   Medication Sig Start Date End Date Taking? Authorizing Provider  acetaminophen (TYLENOL) 325 MG tablet Take 650 mg by mouth daily as needed for headache (pain).   Yes [provider]  ALPRAZolam (XANAX) 0.25 MG tablet TAKE 1 TABLET BY MOUTH TWICE DAILY AS NEEDED Patient taking differently: TAKE 1 TABLET (0.25 MG)  BY MOUTH TWICE DAILY - 2PM AND 9PM 04/16/17  Yes Reed, Tiffany L, DO  antiseptic oral rinse (BIOTENE) LIQD 15 mLs by Mouth Rinse route 5 (five) times daily as needed for dry mouth.   Yes [provider]  aspirin 325 MG tablet Take 1 tablet (325 mg total) by mouth daily. Patient taking differently: Take 325 mg by mouth daily with supper.  07/28/14  Yes Reyne Dumas, MD  carboxymethylcellulose (REFRESH TEARS) 0.5 % SOLN Place 1 drop into both eyes 5 (five) times daily as needed (dry eyes).    Yes [provider]  CARTIA XT 180 MG 24 hr capsule TAKE ONE CAPSULE BY MOUTH ONCE DAILY Patient taking differently: TAKE ONE CAPSULE (180 MG) BY MOUTH ONCE DAILY 01/15/17  Yes Hilty, Nadean Corwin, MD  DULoxetine (CYMBALTA) 30 MG capsule TAKE 1 CAPSULE BY  MOUTH ONCE DAILY FOR  BACK  PAIN  AND  DEPRESSION Patient taking differently: TAKE 1 CAPSULE (30 MG) BY MOUTH EVERY MORNING FOR  BACK  PAIN  AND  DEPRESSION 02/02/17  Yes Reed, Tiffany L, DO  DULoxetine (CYMBALTA) 60 MG capsule Take 1 capsule (60 mg total) by mouth daily. Patient taking differently: Take 60 mg by mouth daily with supper.  03/29/17  Yes Reed, Tiffany L, DO  furosemide (LASIX) 40 MG tablet TAKE 1 TABLET BY MOUTH ONCE DAILY Patient taking differently: TAKE 1 TABLET (40 MG) BY MOUTH ONCE DAILY 02/09/17  Yes Reed, Tiffany L, DO  HYDROcodone-acetaminophen (NORCO/VICODIN) 5-325 MG tablet Take 1 tablet by mouth every 6 (six) hours as needed for moderate pain. Patient taking differently: Take 1 tablet by mouth See admin instructions. Take 1 tablet by mouth twice daily - 9am and 9pm - may also take a  3rd tablet during the day as needed for pain 04/23/17  Yes Reed, Tiffany L, DO  hydroxychloroquine (PLAQUENIL) 200 MG tablet Take 200mg  po qd Patient taking differently: Take 200 mg by mouth daily.  03/08/17  Yes Deveshwar, Abel Presto, MD  KLOR-CON M20 20 MEQ tablet TAKE 1 TABLET BY MOUTH ONCE DAILY Patient taking differently: TAKE 1 TABLET (20 MEQ) BY MOUTH ONCE DAILY 03/30/17  Yes Reed, Tiffany L, DO  lansoprazole (PREVACID) 30 MG capsule TAKE 1 CAPSULE BY MOUTH ONCE DAILY BEFORE BREAKFAST Patient taking differently: TAKE 1 CAPSULE (30 MG) BY MOUTH DAILY 03/12/17  Yes Reed, Tiffany L, DO  LINZESS 290 MCG CAPS capsule TAKE 1 CAPSULE BY MOUTH ONCE DAILY BEFORE BREAKFAST Patient taking differently: TAKE 1 CAPSULE (290 MCG) BY MOUTH DAILY 04/13/17  Yes Gatha Mayer, MD  LYRICA 75 MG capsule TAKE 1 CAPSULE BY MOUTH TWICE DAILY AS NEEDED FOR PAIN Patient taking differently: TAKE 1 CAPSULE (75 MG) BY MOUTH TWICE DAILY  -9A AND 5P 02/09/17  Yes Reed, Tiffany L, DO  memantine (NAMENDA) 10 MG tablet TAKE ONE TABLET BY MOUTH TWICE DAILY Patient taking differently: TAKE ONE TABLET (10 MG) BY MOUTH TWICE DAILY 9a  and 5p 05/30/16  Yes Reed, Tiffany L, DO  mirabegron ER (MYRBETRIQ) 25 MG TB24 tablet Take 1 tablet (25 mg total) by mouth daily. 03/29/17  Yes Reed, Tiffany L, DO  pilocarpine (SALAGEN) 5 MG tablet Take 1 tablet (5 mg total) by mouth 2 (two) times daily. Patient taking differently: Take 5 mg by mouth 2 (two) times daily. 9a and 5p 11/08/16  Yes Deveshwar, Abel Presto, MD  polyethylene glycol (MIRALAX / GLYCOLAX) packet Take 17 g by mouth daily as needed (constipation). Mix in 8 oz liquid and drink   Yes [provider]  senna (SENOKOT) 8.6 MG TABS tablet Take 1 tablet (8.6 mg total) by mouth daily. 03/29/17  Yes Reed, Tiffany L, DO  sodium chloride (OCEAN) 0.65 % SOLN nasal spray Place 1 spray into the nose as needed for congestion. Patient taking differently: Place 1 spray into both nostrils 5 (five) times daily as needed for congestion.  08/23/12  Yes Hongalgi, Lenis Dickinson, MD  magic mouthwash w/lidocaine SOLN Take 5 mLs by mouth 4 (four) times daily.  09/06/15  [provider]    Inpatient Medications: Scheduled Meds: . aspirin  325 mg Oral Q supper  . diltiazem  180 mg Oral Daily  . DULoxetine  30 mg Oral Daily  . DULoxetine  60 mg Oral Q supper  . enoxaparin (LOVENOX) injection  40 mg Subcutaneous Q24H  . furosemide  40 mg Oral Daily  . HYDROcodone-acetaminophen  1 tablet Oral BID  . hydroxychloroquine  200 mg Oral Daily  . insulin aspart  0-15 Units Subcutaneous TID WC  . insulin aspart  0-5 Units Subcutaneous QHS  . linaclotide  290 mcg Oral QAC breakfast  . memantine  10 mg Oral BID  . mirabegron ER  25 mg Oral Daily  . pantoprazole  40 mg Oral Daily  . pilocarpine  5 mg Oral BID  . pregabalin  75 mg Oral BID  . senna  1 tablet Oral Daily   Continuous Infusions:  PRN Meds: acetaminophen **OR** acetaminophen, ALPRAZolam, antiseptic oral rinse, ipratropium, levalbuterol, polyethylene glycol, polyvinyl alcohol, sodium chloride  Allergies:    Allergies  Allergen  Reactions  . Banana Nausea And Vomiting  . Codeine Nausea Only    unless given with Phenergan  . Klonopin [Clonazepam] Other (  See Comments)    Causes hallucination   . Meperidine Hcl Nausea Only    unless given with Phenergan  . Norflex [Orphenadrine Citrate] Nausea Only    Unless given with Phenergan  . Oxycodone-Acetaminophen Nausea Only    unless given with phenergan  . Propoxyphene Hcl Nausea Only    unless given with phenergan  . Zoloft [Sertraline Hcl] Other (See Comments)    Caused lethargy  . Doxycycline Other (See Comments)    Unknown reaction  . Naproxen Other (See Comments)    Unknown reaction  . Penicillins Other (See Comments)    Unknown reaction Has patient had a PCN reaction causing immediate rash, facial/tongue/throat swelling, SOB or lightheadedness with hypotension: Unknown Has patient had a PCN reaction causing severe rash involving mucus membranes or skin necrosis: Unknown Has patient had a PCN reaction that required hospitalization: pt was in the hospital at time of reaction Has patient had a PCN reaction occurring within the last 10 years: Unknown If all of the above answers are "NO", then may proceed with Cephalos  . Phenothiazines Other (See Comments)    Unknown reaction  . Stelazine Other (See Comments)    Unknown reaction  . Sulfamethoxazole-Trimethoprim Other (See Comments)    Unknown reaction  . Tolectin [Tolmetin Sodium] Other (See Comments)    Unknown reaction  . Tramadol Other (See Comments)    Unknown reaction    Social History:   Social History   Socioeconomic History  . Marital status: Widowed    Spouse name: Not on file  . Number of children: 2  . Years of education: 69  . Highest education level: Not on file  Social Needs  . Financial resource strain: Not on file  . Food insecurity - worry: Not on file  . Food insecurity - inability: Not on file  . Transportation needs - medical: Not on file  . Transportation needs -  non-medical: Not on file  Occupational History  . Occupation: Retired  Tobacco Use  . Smoking status: Former Research scientist (life sciences)  . Smokeless tobacco: Never Used  . Tobacco comment: Quit at age 9   Substance and Sexual Activity  . Alcohol use: No  . Drug use: No  . Sexual activity: No  Other Topics Concern  . 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    Patient is Widowed.    Patient has 2 children.    Patient is retired.    Former smoker   Alcohol none   Exercise Walk, exercise chair 4 days a week   POA    Walks with cane      Patient drinks about 1-2 cups of hot tea daily.   Patient is right handed.                Family History:   Family History  Problem Relation Age of Onset  . Heart disease Father        heart attack  . Pneumonia Father   . Heart attack Mother   . Hypertension Mother   . Colon cancer Sister   . Kidney disease Daughter   . Asthma Daughter   . Arthritis Daughter 88       osteo,  . Heart disease Son 33       stage 3 CHF(Diastolic /Systolic)  . Throat cancer Brother   . Hypertension Maternal Grandmother    Family Status:  Family Status  Relation Name Status  . Father  Deceased at age 79  . Mother  Deceased at age 34  . Brother Marden Noble Deceased at age 84       encephalitis  . Sister Luellen Pucker Deceased at age 59  . Daughter Elliet Goodnow  . Son Sewell Deceased  . Sister Hollace Kinnier  . Brother Laverna Peace Deceased at age 43  . MGM  (Not Specified)    ROS:  Please see the history of present illness.  All other ROS reviewed and negative.     Physical Exam/Data:   Vitals:   04/24/17 0600 04/24/17 0621 04/24/17 0845 04/24/17 1100  BP: 132/61     Pulse: 69 75    Resp: 13 14    Temp:   97.7 F (36.5 C) (!) 97.5 F (36.4 C)  TempSrc:   Oral Oral  SpO2: 95% 98%  96%  Weight:   158 lb 1.1 oz (71.7 kg)   Height:   5\' 3"  (1.6 m)     Intake/Output Summary (Last 24 hours) at 04/24/2017 1225 Last data  filed at 04/24/2017 0816 Gross per 24 hour  Intake -  Output 250 ml  Net -250 ml   Filed Weights   04/23/17 1756 04/24/17 0845  Weight: 158 lb (71.7 kg) 158 lb 1.1 oz (71.7 kg)   Body mass index is 28 kg/m.   General: Well developed, well nourished, NAD Skin: Warm, dry, intact  Head: Normocephalic, atraumatic, clear, moist mucus membranes. Neck: Negative for carotid bruits. No JVD Lungs:Clear to ausculation bilaterally. No wheezes, rales, or rhonchi. Breathing is unlabored. Cardiovascular: Irregular with S1 S2. No murmurs, rubs or  Gallops Abdomen: Firm, tender, and distended. No obvious abdominal masses. MSK: Strength and tone appear normal for age. 5/5 in all extremities Extremities: No edema. No clubbing or cyanosis. DP/PT pulses 2+ bilaterally Neuro: Alert and oriented to person and place. No focal deficits. No facial asymmetry. MAE spontaneously. Psych: Advancing dementia with confusion   EKG:  The EKG was personally reviewed and demonstrates: 04/23/17 Atrial fibrillation HR 132 Telemetry:  Telemetry was personally reviewed and demonstrates: 04/24/17 AF HR 86-92  Relevant CV Studies:  ECHO: 07/26/17 Study Conclusions  - Left ventricle: The cavity size was normal. Systolic function was   normal. The estimated ejection fraction was in the range of 55%   to 60%. Images were inadequate for LV wall motion assessment.   Features are consistent with a pseudonormal left ventricular   filling pattern, with concomitant abnormal relaxation and   increased filling pressure (grade 2 diastolic dysfunction). - Mitral valve: Calcified annulus. There was mild regurgitation. - Left atrium: The atrium was mildly dilated. - Tricuspid valve: There was trivial regurgitation.  CATH: NA  Laboratory Data:  Chemistry Recent Labs  Lab 04/23/17 1758 04/24/17 0251  NA 140 138  K 4.2 4.0  CL 104 104  CO2 24 23  GLUCOSE 183* 147*  BUN 20 15  CREATININE 1.01* 0.83  CALCIUM 9.4 9.0    GFRNONAA 49* >60  GFRAA 57* >60  ANIONGAP 12 11    Total Protein  Date Value Ref Range Status  04/23/2017 6.7 6.5 - 8.1 g/dL Final  09/04/2014 6.8 6.0 - 8.5 g/dL Final   Albumin  Date Value Ref Range Status  04/23/2017 3.7 3.5 - 5.0 g/dL Final  09/04/2014 4.1 3.5 - 4.7 g/dL Final   AST  Date Value Ref Range Status  04/23/2017 25 15 - 41 U/L Final   ALT  Date  Value Ref Range Status  04/23/2017 24 14 - 54 U/L Final   Alkaline Phosphatase  Date Value Ref Range Status  04/23/2017 81 38 - 126 U/L Final   Total Bilirubin  Date Value Ref Range Status  04/23/2017 0.7 0.3 - 1.2 mg/dL Final   Bilirubin Total  Date Value Ref Range Status  09/04/2014 0.4 0.0 - 1.2 mg/dL Final   Hematology Recent Labs  Lab 04/23/17 1758 04/24/17 0251  WBC 7.3 7.2  RBC 4.40 4.37  HGB 13.2 13.2  HCT 41.0 40.4  MCV 93.2 92.4  MCH 30.0 30.2  MCHC 32.2 32.7  RDW 13.7 13.8  PLT 218 205   Cardiac Enzymes Recent Labs  Lab 04/23/17 2027 04/24/17 0251 04/24/17 0814  TROPONINI <0.03 <0.03 <0.03    Recent Labs  Lab 04/23/17 1813  TROPIPOC 0.00    BNPNo results for input(s): BNP, PROBNP in the last 168 hours.  DDimer No results for input(s): DDIMER in the last 168 hours. TSH:  Lab Results  Component Value Date   TSH 0.920 04/23/2017   Lipids: Lab Results  Component Value Date   CHOL 189 12/13/2015   HDL 53 12/13/2015   LDLCALC 113 12/13/2015   LDLDIRECT 65.7 07/08/2010   TRIG 116 12/13/2015   CHOLHDL 3.6 12/13/2015   HgbA1c: Lab Results  Component Value Date   HGBA1C 9.1 (H) 03/22/2017    Radiology/Studies:  Dg Chest Port 1 View  Result Date: 04/23/2017 CLINICAL DATA:  Shortness of Breath EXAM: PORTABLE CHEST 1 VIEW COMPARISON:  11/29/2016 FINDINGS: Cardiac shadow is stable. Aortic calcifications are again seen. The lungs are well aerated bilaterally. No focal infiltrate is seen. Minimal platelike atelectasis is noted in the left base. IMPRESSION: Minimal left basilar  atelectasis. Electronically Signed   By: Inez Catalina M.D.   On: 04/23/2017 18:16    Assessment and Plan:   1. Atrial fibrillation with rapid ventricular response with known history of PAF: -Echocardiogram to be completed today 04/24/2017.  Last echo on 07/27/2014 normal LVEF at 55-60% with grade 2 diastolic dysfunction -Diltiazem gtt off>> currently on diltiazem PO 180 mg QD with heart rates maintaining in the 60-70 range -Not on anticoagulation secondary to frequent falls compliance>>> ASA 325 mg -CHA2DS2VASc = 6  2.  DM type II: -New diagnosis, followed by PCP -Per primary  3.  History of CVA secondary to cardioembolic event: -Does not appear to have residual on exam -ASA 325 mg, patient is not currently on anticoagulation candidate secondary to multiple falls and compliance  4.  Dementia: -Followed by her PCP who feels that this is worsening -Has had multiple falls and therefore is not currently candidate for anticoagulation -Will likely need social work follow-up to help with discharge assistance per primary -[place]  5.  Chronic diastolic heart failure: -Per echocardiogram on 07/27/2014 normal EF 55-60% and grade 2 diastolic dysfunction -Echocardiogram results pending to evaluate for new decrease in LVEF -Lasix 40 mg PO QD -Weight,158lb>>> note patient was 163lb at her last office visit 02/26/2017 -I&O, net negative 250 ml since admission  -Patient does not appear to be fluid volume overloaded on exam  6.  HTN: -Stable, 132/61, 117/63, 110/82 -MD office note states labile hypertension, with allowance for higher BP within normal -Seems to be improved   For questions or updates, please contact [facility] Please consult [email or web address] for contact info under Cardiology/STEMI.   Lyndel Safe NP-C [facility] Pager: [phone] 04/24/2017 12:25 PM    I have seen and  examined the patient along with Kathyrn Drown NP-C.  I have reviewed the chart, notes and new  data.  I agree with NP's note.  Key new complaints: feeling better, almost back to baseline Key examination changes: irregular rhythm, otherwise normal CV exam Key new findings / data: telemetry now shows atrial tachycardia around 160 bpm with 2nd deg AV block, MT1 with 4:3 and 3: conduction, occasionally 2:1 AV block, narrow QRS.  PLAN: She is back on a dose of diltiazem equivalent to her home meds.  The most likely explanation for the events is that she did not receive/take her usual Cartia, allowing RVR with a preexisting arrhythmia. She denies this, saying that her meds are carefully laid out daily by her daughter. As discussed, she is a poor candidate for anticoagulation.  No further evaluation or treatment appears indicated. Avoid higher doses of diltiazem, due to the risk of progression to higher grade AV block. If she needs more rate control meds, this may not be possible without pacemaker backup. At this juncture, pacemaker does not appear to be necessary.  Sanda Klein, MD, [place] [phone] 04/24/2017, 4:06 PM

## 2017-04-24 NOTE — Care Management Note (Signed)
Case Management Note  Patient Details  Name: Ariana [name] MRN: [id number] Date of Birth: Jun 25, 1929  Subjective/Objective:        Pt admitted with A fib with RVR           Action/Plan:   PTA from home.  Pt informed CM that she has care giver 8-5pm M-F from [employer].  Pt states that her son-in-law provides care on the weekend as needed.  Pt uses both walker and cane when home and is transported by aide to appts during the week.     Expected Discharge Date:                  Expected Discharge Plan:  Home/Self Care  In-House Referral:     Discharge planning Services  CM Consult  Post Acute Care Choice:    Choice offered to:     DME Arranged:    DME Agency:     HH Arranged:    HH Agency:     Status of Service:     If discussed at [employer] of Stay Meetings, dates discussed:    Additional Comments:  Maryclare Labrador, RN 04/24/2017, 3:25 PM

## 2017-04-24 NOTE — Progress Notes (Signed)
PROGRESS NOTE  Ariana [name] GYK:[id number] DOB: December 07, 1929 DOA: 04/23/2017 PCP: Gayland Curry, DO  HPI/Recap of past 24 hours: HPI from Dr Jenean Lindau is a 84 y.o. female with medical history significant of HTN, PAF not on AC due to frequent falls, MVP, CVA, dementia, Sjogren's syndrome, and DM type II presents from [facility] care after being found to be in A. fib with RVR by her PCP during her routine visit.  Patient reports feeling more short of breath intermittently at rest and with exertion over the last few weeks. Associated symptoms include reports of fall 3 weeks ago, fatigue, abdominal discomfort and distension which is chronic. Pt follows Dr. Debara Pickett of cardiology. In the ED, pt was noted to be in A. fib with RVR with a heart rate up to 145.  Patient was placed on Cardizem drip. Overnight, pt HR was controlled and ggt was stopped.  Today, pt reported feeling ok, had multiple chronic complaints including worsening abd distension, with some mild tenderness which has been ongoing for years as per pt. C/O pain in BLE (hx of neuropathy) and b/l knee pain (kx of arthritis). Denies chest pain, SOB, fever/chills, N/V/D/C.  Assessment/Plan: Principal Problem:   Atrial fibrillation with RVR (HCC) Active Problems:   ANXIETY DEPRESSION   Mild dementia   History of cerebrovascular disease   High risk medication use   A-fib (HCC)   Diabetes mellitus type 2 in nonobese (HCC)  P. Afib with RVR Rate controlled s/p cardizem ggt CHADVASc ~ 6, not on AC due to frequent falls TSH, K+, Mg all WNL ECHO pending Continue home diltiazem, with ASA 325 mg Cardiology consulted Followed by Dr. Debara Pickett of cardiology  Chronic abd pain with distension Ongoing, but pt reports worsening LFTs WNL Had a CT abdomen in 04/2015: Increase in size of intermediate attenuating lesion within the left kidney Repeat CT abd/pelvis w/contrast   DM type 2 Recently diagnosed, not seeing any home  meds A1c noted to be 9.1 on 03/22/17 Hypoglycemic protocols, SSI, adjust accordingly  Sjogren's syndrome Continue pilocarpine and hydroxychloroquine  GERD: Continue PPI  Chronic pain/fibromyalgia/neuropathy Continue Lyrica, hydrocodone prn pain  Anxiety/depression Continue Cymbalta, and Xanax prn  History of CVA Continue aspirin  Dementia Continue Namenda    Code Status: Full  Family Communication: None at bedside  Disposition Plan: Back to NH by 04/25/17    Consultants:  Cardiology  Procedures:  None  Antimicrobials:  None  DVT prophylaxis:  Lovenox   Objective: Vitals:   04/24/17 0500 04/24/17 0600 04/24/17 0621 04/24/17 0845  BP: 117/63 132/61    Pulse: 67 69 75   Resp: 14 13 14    Temp:    97.7 F (36.5 C)  TempSrc:    Oral  SpO2: 94% 95% 98%   Weight:    71.7 kg (158 lb 1.1 oz)  Height:    5\' 3"  (1.6 m)    Intake/Output Summary (Last 24 hours) at 04/24/2017 1048 Last data filed at 04/24/2017 0816 Gross per 24 hour  Intake -  Output 250 ml  Net -250 ml   Filed Weights   04/23/17 1756 04/24/17 0845  Weight: 71.7 kg (158 lb) 71.7 kg (158 lb 1.1 oz)    Exam:   General:  NAD  Cardiovascular: S1, S2 irregular  Respiratory: CTA  Abdomen: Firm on the R side, distended, mildly tender, BS+  Musculoskeletal: No pedal edema  Skin: Normal  Psychiatry: Normal mood   Data Reviewed: CBC:  Recent Labs  Lab 04/23/17 1758 04/24/17 0251  WBC 7.3 7.2  NEUTROABS 3.7  --   HGB 13.2 13.2  HCT 41.0 40.4  MCV 93.2 92.4  PLT 218 998   Basic Metabolic Panel: Recent Labs  Lab 04/23/17 1758 04/23/17 2027 04/24/17 0251 04/24/17 0814  NA 140  --  138  --   K 4.2  --  4.0  --   CL 104  --  104  --   CO2 24  --  23  --   GLUCOSE 183*  --  147*  --   BUN 20  --  15  --   CREATININE 1.01*  --  0.83  --   CALCIUM 9.4  --  9.0  --   MG  --  2.0  --  2.0   GFR: Estimated Creatinine Clearance: 47 mL/min (by C-G formula based on SCr  of 0.83 mg/dL). Liver Function Tests: Recent Labs  Lab 04/23/17 1758  AST 25  ALT 24  ALKPHOS 81  BILITOT 0.7  PROT 6.7  ALBUMIN 3.7   No results for input(s): LIPASE, AMYLASE in the last 168 hours. No results for input(s): AMMONIA in the last 168 hours. Coagulation Profile: No results for input(s): INR, PROTIME in the last 168 hours. Cardiac Enzymes: Recent Labs  Lab 04/23/17 2027 04/24/17 0251 04/24/17 0814  TROPONINI <0.03 <0.03 <0.03   BNP (last 3 results) No results for input(s): PROBNP in the last 8760 hours. HbA1C: No results for input(s): HGBA1C in the last 72 hours. CBG: Recent Labs  Lab 04/23/17 2332 04/24/17 0813  GLUCAP 139* 131*   Lipid Profile: No results for input(s): CHOL, HDL, LDLCALC, TRIG, CHOLHDL, LDLDIRECT in the last 72 hours. Thyroid Function Tests: Recent Labs    04/23/17 0001  TSH 0.920   Anemia Panel: No results for input(s): VITAMINB12, FOLATE, FERRITIN, TIBC, IRON, RETICCTPCT in the last 72 hours. Urine analysis:    Component Value Date/Time   COLORURINE YELLOW 04/04/2016 1536   APPEARANCEUR CLEAR 04/04/2016 1536   APPEARANCEUR Clear 07/08/2015 1241   LABSPEC 1.020 10/06/2016 1451   PHURINE 5.0 10/06/2016 1451   GLUCOSEU NEGATIVE 10/06/2016 1451   GLUCOSEU NEGATIVE 07/14/2011 1312   HGBUR NEGATIVE 10/06/2016 1451   BILIRUBINUR neg 02/15/2017 1640   BILIRUBINUR Negative 07/08/2015 1241   KETONESUR NEGATIVE 10/06/2016 1451   PROTEINUR neg 02/15/2017 1640   PROTEINUR NEGATIVE 10/06/2016 1451   UROBILINOGEN 0.2 02/15/2017 1640   UROBILINOGEN 0.2 10/06/2016 1451   NITRITE neg 02/15/2017 1640   NITRITE NEGATIVE 10/06/2016 1451   LEUKOCYTESUR Trace (A) 02/15/2017 1640   LEUKOCYTESUR Negative 07/08/2015 1241   Sepsis Labs: @LABRCNTIP (procalcitonin:4,lacticidven:4)  )No results found for this or any previous visit (from the past 240 hour(s)).    Studies: Dg Chest Port 1 View  Result Date: 04/23/2017 CLINICAL DATA:   Shortness of Breath EXAM: PORTABLE CHEST 1 VIEW COMPARISON:  11/29/2016 FINDINGS: Cardiac shadow is stable. Aortic calcifications are again seen. The lungs are well aerated bilaterally. No focal infiltrate is seen. Minimal platelike atelectasis is noted in the left base. IMPRESSION: Minimal left basilar atelectasis. Electronically Signed   By: Inez Catalina M.D.   On: 04/23/2017 18:16    Scheduled Meds: . aspirin  325 mg Oral Q supper  . diltiazem  180 mg Oral Daily  . DULoxetine  30 mg Oral Daily  . DULoxetine  60 mg Oral Q supper  . enoxaparin (LOVENOX) injection  40 mg Subcutaneous Q24H  .  furosemide  40 mg Oral Daily  . HYDROcodone-acetaminophen  1 tablet Oral BID  . hydroxychloroquine  200 mg Oral Daily  . insulin aspart  0-15 Units Subcutaneous TID WC  . insulin aspart  0-5 Units Subcutaneous QHS  . linaclotide  290 mcg Oral QAC breakfast  . memantine  10 mg Oral BID  . mirabegron ER  25 mg Oral Daily  . pantoprazole  40 mg Oral Daily  . pilocarpine  5 mg Oral BID  . potassium chloride SA  20 mEq Oral Daily  . pregabalin  75 mg Oral BID  . senna  1 tablet Oral Daily    Continuous Infusions:   LOS: 0 days     Alma Friendly, MD [facility]   If 7PM-7AM, please contact night-coverage [email or web address] Password [place] 04/24/2017, 10:48 AM

## 2017-04-24 NOTE — Care Management Obs Status (Signed)
[facility] NOTIFICATION   Patient Details  Name: Ariana [name] MRN: [id number] Date of Birth: [birth year]   Medicare Observation Status Notification Given:  Yes    Carles Collet, RN 04/24/2017, 1:12 PM

## 2017-04-24 NOTE — ED Notes (Signed)
Dr. Tamala Julian aware pt has been in NSR with HR 70's-80's since yesterday evening. Order to stop Cardizem, change bed request from SDU to [facility]

## 2017-04-24 NOTE — ED Notes (Signed)
Attempted IV start x 1 

## 2017-04-24 NOTE — ED Notes (Signed)
Attempted report times 2  

## 2017-04-25 ENCOUNTER — Observation Stay (HOSPITAL_BASED_OUTPATIENT_CLINIC_OR_DEPARTMENT_OTHER): Payer: Medicare Other

## 2017-04-25 DIAGNOSIS — I4891 Unspecified atrial fibrillation: Secondary | ICD-10-CM

## 2017-04-25 DIAGNOSIS — Z8679 Personal history of other diseases of the circulatory system: Secondary | ICD-10-CM | POA: Diagnosis not present

## 2017-04-25 DIAGNOSIS — I471 Supraventricular tachycardia: Secondary | ICD-10-CM | POA: Diagnosis not present

## 2017-04-25 DIAGNOSIS — E119 Type 2 diabetes mellitus without complications: Secondary | ICD-10-CM | POA: Diagnosis not present

## 2017-04-25 LAB — CBC WITH DIFFERENTIAL/PLATELET
Basophils Absolute: 0 10*3/uL (ref 0.0–0.1)
Basophils Relative: 0 %
EOS ABS: 0.2 10*3/uL (ref 0.0–0.7)
Eosinophils Relative: 2 %
HCT: 42.2 % (ref 36.0–46.0)
HEMOGLOBIN: 13.9 g/dL (ref 12.0–15.0)
LYMPHS ABS: 2.3 10*3/uL (ref 0.7–4.0)
Lymphocytes Relative: 35 %
MCH: 30.4 pg (ref 26.0–34.0)
MCHC: 32.9 g/dL (ref 30.0–36.0)
MCV: 92.3 fL (ref 78.0–100.0)
MONOS PCT: 8 %
Monocytes Absolute: 0.5 10*3/uL (ref 0.1–1.0)
NEUTROS PCT: 55 %
Neutro Abs: 3.6 10*3/uL (ref 1.7–7.7)
Platelets: 220 10*3/uL (ref 150–400)
RBC: 4.57 MIL/uL (ref 3.87–5.11)
RDW: 13.9 % (ref 11.5–15.5)
WBC: 6.6 10*3/uL (ref 4.0–10.5)

## 2017-04-25 LAB — GLUCOSE, CAPILLARY
GLUCOSE-CAPILLARY: 168 mg/dL — AB (ref 65–99)
Glucose-Capillary: 196 mg/dL — ABNORMAL HIGH (ref 65–99)
Glucose-Capillary: 141 mg/dL — ABNORMAL HIGH (ref 65–99)

## 2017-04-25 LAB — BASIC METABOLIC PANEL
Anion gap: 9 (ref 5–15)
BUN: 16 mg/dL (ref 6–20)
CHLORIDE: 105 mmol/L (ref 101–111)
CO2: 24 mmol/L (ref 22–32)
CREATININE: 0.84 mg/dL (ref 0.44–1.00)
Calcium: 8.9 mg/dL (ref 8.9–10.3)
GFR calc Af Amer: >60 mL/min (ref >60)
GFR calc non Af Amer: >60 mL/min (ref >60)
Glucose, Bld: 160 mg/dL — ABNORMAL HIGH (ref 65–99)
Potassium: 3.9 mmol/L (ref 3.5–5.1)
SODIUM: 138 mmol/L (ref 135–145)

## 2017-04-25 LAB — ECHOCARDIOGRAM COMPLETE
Height: 63 in
Weight: 2529.12 oz

## 2017-04-25 MED ORDER — ALPRAZOLAM 0.25 MG PO TABS: 0.2500 mg | ORAL_TABLET | Freq: Two times a day (BID) | ORAL | 0 refills | Status: DC | PRN

## 2017-04-25 NOTE — Clinical Social Work Placement (Signed)
   CLINICAL SOCIAL WORK PLACEMENT  NOTE  Date:  04/25/2017  Patient Details  Name: Ariana [name] MRN: [id number] Date of Birth: 12/22/1929  Clinical Social Work is seeking post-discharge placement for this patient at the [place] level of care (*CSW will initial, date and re-position this form in  chart as items are completed):  Yes   Patient/family provided with [place] Work Department's list of facilities offering this level of care within the geographic area requested by the patient (or if unable, by the patient's family).  Yes   Patient/family informed of their freedom to choose among providers that offer the needed level of care, that participate in Medicare, Medicaid or managed care program needed by the patient, have an available bed and are willing to accept the patient.  Yes   Patient/family informed of [facility]'s ownership interest in [facility] and [facility], as well as of the fact that they are under no obligation to receive care at these facilities.  PASRR submitted to EDS on 04/25/17     PASRR number received on       Existing PASRR number confirmed on 04/25/17     FL2 transmitted to all facilities in geographic area requested by pt/family on 04/25/17     FL2 transmitted to all facilities within larger geographic area on       Patient informed that his/her managed care company has contracts with or will negotiate with certain facilities, including the following:            Patient/family informed of bed offers received.  Patient chooses bed at       Physician recommends and patient chooses bed at      Patient to be transferred to   on  .  Patient to be transferred to facility by       Patient family notified on   of transfer.  Name of family member notified:        PHYSICIAN       Additional Comment:    _______________________________________________ Candie Chroman, LCSW 04/25/2017, 12:18 PM

## 2017-04-25 NOTE — Clinical Social Work Note (Signed)
Clinical Social Work Assessment  Patient Details  Name: Ariana [name] MRN: [id number] Date of Birth: 1929/10/21  Date of referral:  04/25/17               Reason for consult:  Discharge Planning, Facility Placement                Permission sought to share information with:  Facility Sport and exercise psychologist, Family Supports Permission granted to share information::     Name::     Ariana [name]  Agency::  SNF's  Relationship::  Daughter  Contact Information:  [phone]  Housing/Transportation Living arrangements for the past 2 months:  Single Family Home Source of Information:  Patient, Medical Team, Adult Children Patient Interpreter Needed:  None Criminal Activity/Legal Involvement Pertinent to Current Situation/Hospitalization:  No - Comment as needed Significant Relationships:  Adult Children Lives with:  Self Do you feel safe going back to the place where you live?  Yes Need for family participation in patient care:  Yes (Comment)  Care giving concerns:  PT recommending SNF once medically stable for discharge.   Social Worker assessment / plan:  CSW met with patient. No supports at bedside. CSW introduced role and explained that PT recommendations would be discussed. Patient unsure if she wants SNF or not. She wants to do what her daughter wants her to do. CSW called patient's daughter while in the room. Due to the fact she cannot tour facilities, she is unsure whether she prefers home health vs. SNF. CSW emailed her a SNF list to review. Patient provided with one as well. If patient returns home with home health, company preference is [facility] and they are agreeable to a home health social worker in case she needs to be placed from home. CSW sent out SNF referral. Awaiting bed offers. No further concerns. CSW encouraged patient and her daughter to contact CSW as needed. CSW will continue to follow patient and her daughter for support and facilitate discharge to SNF, if  agreeable, once medically stable.  Employment status:  Retired Nurse, adult PT Recommendations:  [place] / Referral to community resources:  [place]  Patient/Family's Response to care:  Patient and her daughter are not sure if they want SNF or home health. Patient's daughter supportive and involved in patient's care. Patient and her daughter appreciated social work intervention.  Patient/Family's Understanding of and Emotional Response to Diagnosis, Current Treatment, and Prognosis:  Patient and her daughter have a good understanding of the reason for admission and her need for continued therapy at discharge. Patient and her daughter appear happy with hospital care.  Emotional Assessment Appearance:  Appears stated age Attitude/Demeanor/Rapport:  Engaged, Gracious Affect (typically observed):  Accepting, Appropriate, Calm, Pleasant Orientation:  Oriented to Self, Oriented to Place, Oriented to  Time, Oriented to Situation Alcohol / Substance use:  Never Used Psych involvement (Current and /or in the community):  No (Comment)  Discharge Needs  Concerns to be addressed:  Care Coordination Readmission within the last 30 days:  No Current discharge risk:  Dependent with Mobility, Lives alone Barriers to Discharge:  Continued Medical Work up, [place], LCSW 04/25/2017, 12:12 PM

## 2017-04-25 NOTE — Evaluation (Signed)
Physical Therapy Evaluation Patient Details Name: Ariana [name] MRN: [id number] DOB: 04-04-1929 Today's Date: 04/25/2017   History of Present Illness  Pt is an 84 y.o. female admitted from home on 04/23/17 with worsening SOB, dizziness and abdominal discomfort; worked up for a-fib with RVR. PMH includes multiple falls, CVA, HTN, a-fib (not on anticoagulation due to frequent falls), fibromyalgia, advancing dementia, DM II with bilat foot peripheral neuropathy.    Clinical Impression  Pt presents with an overall decrease in functional mobility secondary to above. PTA, pt lives alone, amb household distances with RW, and endorses multiple falls; has home aide assist M-F form 9a-5p. Today, pt able to amb short distance with RW and min guard for balance. Pt demonstrates decreased light touch and proprioception in bilateral feet; this, along with her decreased memory, decreased safety awareness, and generalized weakness significantly increase her fall risk. Feel pt would benefit from SNF-level therapies to maximize functional mobility and independence; with potential long-term care after this. Do not feel pt is safe to return home without 24/7 support. Will follow acutely to address established goals.    Follow Up Recommendations SNF;Supervision/Assistance - 24 hour    Equipment Recommendations  None recommended by PT    Recommendations for Other Services       Precautions / Restrictions Precautions Precautions: Fall Restrictions Weight Bearing Restrictions: No      Mobility  Bed Mobility Overal bed mobility: Needs Assistance Bed Mobility: Supine to Sit     Supine to sit: Supervision;HOB elevated        Transfers Overall transfer level: Needs assistance Equipment used: Rolling walker (2 wheeled) Transfers: Sit to/from Stand Sit to Stand: Min guard         General transfer comment: Min guard for balance standing from bed and sitting on toilet; poor eccentric control into  sitting on toilet, reliant on single UE support on rail  Ambulation/Gait Ambulation/Gait assistance: Min guard Ambulation Distance (Feet): 10 Feet Assistive device: Rolling walker (2 wheeled) Gait Pattern/deviations: Step-to pattern;Trunk flexed Gait velocity: Decreased Gait velocity interpretation: <1.8 ft/sec, indicative of risk for recurrent falls General Gait Details: Amb to bathroom with RW and min guard for balance. Pt reports bilat foot numbness, but able to feel some slight pressure to area. Further mobility limited by pt needing significant increased time to have bowel movement  Stairs            Wheelchair Mobility    Modified Rankin (Stroke Patients Only)       Balance Overall balance assessment: Needs assistance   Sitting balance-Leahy Scale: Good Sitting balance - Comments: Able to don socks sitting EOB with increased time and effort     Standing balance-Leahy Scale: Poor Standing balance comment: Reliant on UE support                             Pertinent Vitals/Pain Pain Assessment: Faces Faces Pain Scale: Hurts a little bit Pain Location: Abdomen Pain Descriptors / Indicators: Cramping Pain Intervention(s): Monitored during session    Home Living Family/patient expects to be discharged to:: Private residence Living Arrangements: Alone Available Help at Discharge: Family;Personal care attendant;Available PRN/intermittently Type of Home: House Home Access: Level entry     Home Layout: One level Home Equipment: Walker - 4 wheels;Cane - single point;Cane - quad;Grab bars - toilet;Shower seat Additional Comments: Home aide M-F 9-5; son-in-law checks on pt over weekends    Prior Function Level of Independence:  Needs assistance   Gait / Transfers Assistance Needed: Amb household distances with rollator. Aide drives pt to appoitnments. Does not leave house much. Endorses multiple falls  ADL's / Homemaking Assistance Needed: Has home aide  assist M-F 9a-5p with meals, housework, bathing, and other household tasks        Hand Dominance        Extremity/Trunk Assessment   Upper Extremity Assessment Upper Extremity Assessment: Generalized weakness    Lower Extremity Assessment Lower Extremity Assessment: Generalized weakness;RLE deficits/detail;LLE deficits/detail RLE Sensation: decreased light touch;history of peripheral neuropathy LLE Sensation: history of peripheral neuropathy;decreased light touch    Cervical / Trunk Assessment Cervical / Trunk Assessment: Kyphotic  Communication   Communication: HOH  Cognition Arousal/Alertness: Awake/alert Behavior During Therapy: WFL for tasks assessed/performed Overall Cognitive Status: History of cognitive impairments - at baseline Area of Impairment: Attention;Memory;Following commands;Safety/judgement;Awareness;Problem solving                   Current Attention Level: Sustained Memory: Decreased short-term memory Following Commands: Follows one step commands with increased time;Follows multi-step commands inconsistently Safety/Judgement: Decreased awareness of safety;Decreased awareness of deficits Awareness: Emergent Problem Solving: Slow processing General Comments: Pt repeating herself multiple times throughout session. Had to be redirected to task and current conversation topic multiple times      General Comments      Exercises     Assessment/Plan    PT Assessment Patient needs continued PT services  PT Problem List Decreased strength;Decreased activity tolerance;Decreased balance;Decreased mobility;Decreased cognition;Decreased knowledge of use of DME;Decreased safety awareness       PT Treatment Interventions DME instruction;Gait training;Stair training;Functional mobility training;Therapeutic activities;Therapeutic exercise;Balance training;Patient/family education    PT Goals (Current goals can be found in the Care Plan section)  Acute  Rehab PT Goals Patient Stated Goal: Return home PT Goal Formulation: With patient Time For Goal Achievement: 05/09/17 Potential to Achieve Goals: Fair    Frequency Min 2X/week   Barriers to discharge Decreased caregiver support      Co-evaluation               AM-PAC PT "6 Clicks" Daily Activity  Outcome Measure Difficulty turning over in bed (including adjusting bedclothes, sheets and blankets)?: A Little Difficulty moving from lying on back to sitting on the side of the bed? : A Little Difficulty sitting down on and standing up from a chair with arms (e.g., wheelchair, bedside commode, etc,.)?: A Little Help needed moving to and from a bed to chair (including a wheelchair)?: A Little Help needed walking in hospital room?: A Little Help needed climbing 3-5 steps with a railing? : A Lot 6 Click Score: 17    End of Session Equipment Utilized During Treatment: Gait belt Activity Tolerance: Patient limited by fatigue Patient left: with nursing/sitter in room(using bathroom with RN in room; chair set up with alarm) Nurse Communication: Mobility status PT Visit Diagnosis: Other abnormalities of gait and mobility (R26.89);Repeated falls (R29.6)    Time: 1017-5102 PT Time Calculation (min) (ACUTE ONLY): 26 min   Charges:   PT Evaluation $PT Eval Moderate Complexity: 1 Mod PT Treatments $Therapeutic Activity: 8-22 mins   PT G Codes:       Mabeline Caras, PT, DPT Acute Rehab Services  Pager: [place] 04/25/2017, 9:05 AM

## 2017-04-25 NOTE — Progress Notes (Signed)
Progress Note  Patient Name: Ariana [name] Date of Encounter: 04/25/2017  Primary Cardiologist: No primary care provider on file.   Subjective   No CV complaints. Her abdomen is still an issue, nonspecific complaints. CT abdomen non-diagnostic.  Inpatient Medications    Scheduled Meds: . aspirin  325 mg Oral Q supper  . diltiazem  180 mg Oral Daily  . DULoxetine  30 mg Oral Daily  . DULoxetine  60 mg Oral Q supper  . enoxaparin (LOVENOX) injection  40 mg Subcutaneous Q24H  . furosemide  40 mg Oral Daily  . HYDROcodone-acetaminophen  1 tablet Oral BID  . hydroxychloroquine  200 mg Oral Daily  . insulin aspart  0-15 Units Subcutaneous TID WC  . insulin aspart  0-5 Units Subcutaneous QHS  . linaclotide  290 mcg Oral QAC breakfast  . memantine  10 mg Oral BID  . mirabegron ER  25 mg Oral Daily  . pantoprazole  40 mg Oral Daily  . pilocarpine  5 mg Oral BID  . pregabalin  75 mg Oral BID  . senna  1 tablet Oral Daily   Continuous Infusions:  PRN Meds: acetaminophen **OR** acetaminophen, ALPRAZolam, antiseptic oral rinse, ipratropium, levalbuterol, polyethylene glycol, polyvinyl alcohol, sodium chloride   Vital Signs    Vitals:   04/24/17 2009 04/24/17 2341 04/25/17 0406 04/25/17 0700  BP: 136/63 123/90 104/81 116/86  Pulse:      Resp: 15 17 15 18   Temp: 98.4 F (36.9 C) 98.2 F (36.8 C) (!) 97.5 F (36.4 C) (!) 97.4 F (36.3 C)  TempSrc: Oral Oral Oral Oral  SpO2:      Weight:      Height:        Intake/Output Summary (Last 24 hours) at 04/25/2017 0857 Last data filed at 04/24/2017 1620 Gross per 24 hour  Intake 1240 ml  Output 700 ml  Net 540 ml   Filed Weights   04/23/17 1756 04/24/17 0845  Weight: 158 lb (71.7 kg) 158 lb 1.1 oz (71.7 kg)    Telemetry    Atrial flutter/tachycardia with variable AV block, rate controled - Personally Reviewed  ECG    Slow atrial flutter/atrial tachycardia around 150 bpm with variable AV block, LAFB - Personally  Reviewed  Physical Exam  Looks very comfortable both supine and sitting up in chair GEN: No acute distress.   Neck: No JVD Cardiac: irregular, no murmurs, rubs, or gallops.  Respiratory: Clear to auscultation bilaterally. GI: Soft, nontender, non-distended  MS: No edema; No deformity. Neuro:  Nonfocal  Psych: Normal affect   Labs    Chemistry Recent Labs  Lab 04/23/17 1758 04/24/17 0251 04/25/17 0636  NA 140 138 138  K 4.2 4.0 3.9  CL 104 104 105  CO2 24 23 24   GLUCOSE 183* 147* 160*  BUN 20 15 16   CREATININE 1.01* 0.83 0.84  CALCIUM 9.4 9.0 8.9  PROT 6.7  --   --   ALBUMIN 3.7  --   --   AST 25  --   --   ALT 24  --   --   ALKPHOS 81  --   --   BILITOT 0.7  --   --   GFRNONAA 49* >60 >60  GFRAA 57* >60 >60  ANIONGAP 12 11 9      Hematology Recent Labs  Lab 04/23/17 1758 04/24/17 0251 04/25/17 0636  WBC 7.3 7.2 6.6  RBC 4.40 4.37 4.57  HGB 13.2 13.2 13.9  HCT  41.0 40.4 42.2  MCV 93.2 92.4 92.3  MCH 30.0 30.2 30.4  MCHC 32.2 32.7 32.9  RDW 13.7 13.8 13.9  PLT 218 205 220    Cardiac Enzymes Recent Labs  Lab 04/23/17 2027 04/24/17 0251 04/24/17 0814  TROPONINI <0.03 <0.03 <0.03    Recent Labs  Lab 04/23/17 1813  TROPIPOC 0.00     BNPNo results for input(s): BNP, PROBNP in the last 168 hours.   DDimer No results for input(s): DDIMER in the last 168 hours.   Radiology    Ct Abdomen Pelvis W Contrast  Result Date: 04/24/2017 CLINICAL DATA:  84 year old female with upper abdominal pain extending down right side with bloating since yesterday. Prior cholecystectomy, hysterectomy and appendectomy. Initial encounter. EXAM: CT ABDOMEN AND PELVIS WITH CONTRAST TECHNIQUE: Multidetector CT imaging of the abdomen and pelvis was performed using the standard protocol following bolus administration of intravenous contrast. CONTRAST:  150mL ISOVUE-300 IOPAMIDOL (ISOVUE-300) INJECTION 61% COMPARISON:  04/18/2015 CT. FINDINGS: Lower chest: Scarring/minimal  atelectasis lung bases. Hepatobiliary: Fatty enlarged liver spanning over 19.1 cm. No worrisome hepatic lesion. Post cholecystectomy. Common bile duct measures 8.6 mm consistent with patient's age and post cholecystectomy state. No calcified common bile duct stone. Pancreas: No pancreatic mass or inflammation. Spleen: No splenic mass or enlargement. Adrenals/Urinary Tract: No obstructing stone or hydronephrosis. No worrisome renal or adrenal lesion. Noncontrast filled contracted urinary bladder without gross abnormality. Stomach/Bowel: Sigmoid diverticulosis without evidence of diverticulitis. Areas of narrowing of the colon which may be related to peristalsis although limiting evaluation for detection of a small mass. Anterior aspect of transverse colon extends into periumbilical hernia but does not cause obstruction. Small hiatal hernia.  No gastric or duodenal abnormality noted. Vascular/Lymphatic: Superior aspect of the inferior vena cava is narrowed including narrowing through the liver. No web like stenosis such as seen with Budd-Chiari. Mild atherosclerotic changes abdominal aorta without aneurysm. No large arterial vessel occlusion. No adenopathy. Reproductive: Post hysterectomy.  No adnexal mass noted. Other: Lax abdominal musculature. Left inguinal canal fat containing hernia. No free intraperitoneal air. Musculoskeletal: Degenerative changes L5-S1.  Hemangioma L3. IMPRESSION: Fatty enlarged liver spanning over 19.1 cm. Post cholecystectomy. Sigmoid diverticulosis. Anterior aspect of transverse colon extends into periumbilical hernia but does not cause obstruction. Small hiatal hernia. Superior aspect of the inferior vena cava is narrowed including narrowing through the liver. No web like stenosis such as seen with Budd-Chiari. Post hysterectomy. Degenerative changes L5-S1. Electronically Signed   By: Genia Del M.D.   On: 04/24/2017 18:01   Dg Chest Port 1 View  Result Date: 04/23/2017 CLINICAL  DATA:  Shortness of Breath EXAM: PORTABLE CHEST 1 VIEW COMPARISON:  11/29/2016 FINDINGS: Cardiac shadow is stable. Aortic calcifications are again seen. The lungs are well aerated bilaterally. No focal infiltrate is seen. Minimal platelike atelectasis is noted in the left base. IMPRESSION: Minimal left basilar atelectasis. Electronically Signed   By: Inez Catalina M.D.   On: 04/23/2017 18:16    Cardiac Studies   Echo pending  Patient Profile     84 y.o. 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 Atrial flutter/tachycardia, history of atrial fibrillation: -Asymptomatic, rate controlled, not a candidate for anticoagulation, despite history of embolic CVA. Continue current meds for rate control 2. Dementia: -  Followed by her PCP who feels that this is worsening -Has had multiple falls and therefore is not currently candidate for anticoagulation -Will likely need social work follow-up to help with discharge assistance per primary 3.  Chronic diastolic heart failure: -Per echocardiogram on 07/27/2014 normal EF 55-60%  -Echocardiogram pending on current admission -appears euvolemic clinically 4.  HTN: -consistently in desirable range   For questions or updates, please contact [place] Please consult [email or web address] for contact info under Cardiology/STEMI.      Signed, Sanda Klein, MD  04/25/2017, 8:57 AM

## 2017-04-25 NOTE — NC FL2 (Signed)
[place] LEVEL OF CARE SCREENING TOOL     IDENTIFICATION  Patient Name: Ariana [name] Birthdate: 1929-03-02 Sex: female Admission Date (Current Location): 04/23/2017  [facility] and [place] Number:  Herbalist and Address:  [facility] [place], [place] [address] [place], [place] [address]      Provider Number: [id number]  Attending Physician Name and Address:  Velvet Bathe, MD  Relative Name and Phone Number:       Current Level of Care: Hospital Recommended Level of Care: [place] Prior Approval Number:    Date Approved/Denied:   PASRR Number: [id number]  Discharge Plan: SNF    Current Diagnoses: Patient Active Problem List   Diagnosis Date Noted  . A-fib ([place]) 04/23/2017  . Diabetes mellitus type 2 in nonobese ([place]) 04/23/2017  . Sjogren's disease ([place]) 04/03/2016  . Primary osteoarthritis of both hands 04/03/2016  . High risk medication use 04/03/2016  . Senile dementia, with behavioral disturbance 08/16/2015  . Swelling 08/16/2015  . Diastolic dysfunction 56/21/3086  . Hypertonicity, bladder 07/08/2015  . Arterial hypotension   . Bradycardia 06/29/2015  . Chronic lower back pain 06/29/2015  . PAF (paroxysmal atrial fibrillation) ([place]) 04/18/2015  . Dehydration 04/18/2015  . Dementia without behavioral disturbance 04/18/2015  . TIA (transient ischemic attack) 12/17/2014  . Chest pain 12/16/2014  . Acute encephalopathy 12/04/2014  . Fall   . AKI (acute kidney injury) ([place]) 12/03/2014  . History of cerebrovascular disease 09/24/2014  . Falls frequently 07/28/2014  . Infarction of parietal lobe   . Mild dementia   . CVA (cerebral infarction) 07/26/2014  . Atrial fibrillation with RVR ([place]) 07/03/2014  . Constipation 04/15/2014  . Mixed stress and urge urinary incontinence 11/03/2013  . Therapeutic opioid induced constipation 11/03/2013  . Hemorrhoid 11/03/2013  . Low back pain associated with a spinal  disorder other than radiculopathy or spinal stenosis 11/03/2013  . Protein-calorie malnutrition, severe ([place]) 07/22/2013  . UTI (lower urinary tract infection) 07/20/2013  . Prolonged QT interval 07/20/2013  . Osteopenia 07/17/2013  . Palpitations 06/30/2013  . Hereditary and idiopathic peripheral neuropathy 05/22/2013  . Abnormality of gait 12/05/2012  . Headache(784.0) 09/05/2012  . Multinodular thyroid 01/15/2012  . Neck pain 01/15/2012  . Hypercholesterolemia 07/08/2010  . Tear film insufficiency 07/06/2009  . [place] SYNDROME 07/06/2009  . Urge urinary incontinence 01/06/2009  . COLONIC POLYPS, ADENOMATOUS, HX OF 04/15/2008  . MITRAL VALVE PROLAPSE 11/05/2007  . ANXIETY DEPRESSION 07/02/2007  . Mononeuritis 07/02/2007  . HYPERTENSION, BENIGN 07/02/2007  . GERD 07/02/2007  . Irritable bowel syndrome 07/02/2007  . Fibromyalgia 07/02/2007    Orientation RESPIRATION BLADDER Height & Weight     Self, Time, Situation, Place  Normal Continent, External catheter Weight: 158 lb 1.1 oz (71.7 kg) Height:  5\' 3"  (160 cm)  BEHAVIORAL SYMPTOMS/MOOD NEUROLOGICAL BOWEL NUTRITION STATUS  (None) (Mild Dementia) Continent Diet(Heart healthy/carb modified)  AMBULATORY STATUS COMMUNICATION OF NEEDS Skin   Limited Assist Verbally Normal                       Personal Care Assistance Level of Assistance  Bathing, Feeding, Dressing Bathing Assistance: Limited assistance Feeding assistance: Limited assistance Dressing Assistance: Limited assistance     Functional Limitations Info  Sight, Hearing, Speech Sight Info: Adequate Hearing Info: Adequate Speech Info: Adequate    SPECIAL CARE FACTORS FREQUENCY  PT (By licensed PT), OT (By licensed OT)     PT Frequency: 5  x week OT Frequency: 5 x week            Contractures Contractures Info: Not present    Additional Factors Info  Code Status, Allergies, Psychotropic Code Status Info: Full Allergies Info: Banana, Codeine,  Klonopin (Clonazepam), Meperidine Hcl, Norflex (Orphenadrine Citrate), Oxycodone-acetaminophen, Propoxyphene Hcl, Zoloft (Sertraline Hcl), Doxycycline, Naproxen, Penicillins, Phenothiazines, Stelazine, Sulfamethoxazole-trimethoprim, Tolectin (Tolmetin Sodium), Tramadol Psychotropic Info: Anxiety, Depression: Cymbalta DR 30 mg PO daily, Cymbalta DR 60 mg PO QPM with dinner, Xanax 0.25 mg PO BID prn,          Current Medications (04/25/2017):  This is the current hospital active medication list Current Facility-Administered Medications  Medication Dose Route Frequency Provider Last Rate Last Dose  . acetaminophen (TYLENOL) tablet 650 mg  650 mg Oral Q6H PRN Fuller Plan A, MD       Or  . acetaminophen (TYLENOL) suppository 650 mg  650 mg Rectal Q6H PRN Fuller Plan A, MD      . ALPRAZolam Duanne Moron) tablet 0.25 mg  0.25 mg Oral BID PRN Fuller Plan A, MD   0.25 mg at 04/24/17 2357  . antiseptic oral rinse (BIOTENE) solution 15 mL  15 mL Mouth Rinse 5 X Daily PRN Fuller Plan A, MD      . aspirin tablet 325 mg  325 mg Oral Q supper Fuller Plan A, MD   325 mg at 04/24/17 1722  . diltiazem (CARDIZEM CD) 24 hr capsule 180 mg  180 mg Oral Daily Smith, Rondell A, MD   180 mg at 04/24/17 1041  . DULoxetine (CYMBALTA) DR capsule 30 mg  30 mg Oral Daily Fuller Plan A, MD   30 mg at 04/24/17 0934  . DULoxetine (CYMBALTA) DR capsule 60 mg  60 mg Oral Q supper Fuller Plan A, MD   60 mg at 04/24/17 1722  . enoxaparin (LOVENOX) injection 40 mg  40 mg Subcutaneous Q24H Smith, Rondell A, MD   40 mg at 04/24/17 2357  . furosemide (LASIX) tablet 40 mg  40 mg Oral Daily Smith, Rondell A, MD   40 mg at 04/24/17 1041  . HYDROcodone-acetaminophen (NORCO/VICODIN) 5-325 MG per tablet 1 tablet  1 tablet Oral BID Fuller Plan A, MD   1 tablet at 04/25/17 0847  . hydroxychloroquine (PLAQUENIL) tablet 200 mg  200 mg Oral Daily Fuller Plan A, MD   200 mg at 04/24/17 0935  . insulin aspart (novoLOG)  injection 0-15 Units  0-15 Units Subcutaneous TID WC Norval Morton, MD   3 Units at 04/25/17 0847  . insulin aspart (novoLOG) injection 0-5 Units  0-5 Units Subcutaneous QHS Smith, Rondell A, MD      . ipratropium (ATROVENT) nebulizer solution 0.5 mg  0.5 mg Nebulization Q6H PRN Smith, Rondell A, MD      . levalbuterol (XOPENEX) nebulizer solution 0.63 mg  0.63 mg Nebulization Q6H PRN Fuller Plan A, MD      . linaclotide (LINZESS) capsule 290 mcg  290 mcg Oral QAC breakfast Fuller Plan A, MD   290 mcg at 04/25/17 0849  . memantine (NAMENDA) tablet 10 mg  10 mg Oral BID Fuller Plan A, MD   10 mg at 04/24/17 2120  . mirabegron ER (MYRBETRIQ) tablet 25 mg  25 mg Oral Daily Fuller Plan A, MD   25 mg at 04/24/17 0934  . pantoprazole (PROTONIX) EC tablet 40 mg  40 mg Oral Daily Fuller Plan A, MD   40 mg at 04/24/17 0934  .  pilocarpine (SALAGEN) tablet 5 mg  5 mg Oral BID Fuller Plan A, MD   5 mg at 04/24/17 2120  . polyethylene glycol (MIRALAX / GLYCOLAX) packet 17 g  17 g Oral Daily PRN Smith, Rondell A, MD      . polyvinyl alcohol (LIQUIFILM TEARS) 1.4 % ophthalmic solution 1 drop  1 drop Both Eyes PRN Smith, Rondell A, MD      . pregabalin (LYRICA) capsule 75 mg  75 mg Oral BID Fuller Plan A, MD   75 mg at 04/24/17 2120  . senna (SENOKOT) tablet 8.6 mg  1 tablet Oral Daily Smith, Rondell A, MD   8.6 mg at 04/24/17 0935  . sodium chloride (OCEAN) 0.65 % nasal spray 1 spray  1 spray Each Nare 5 X Daily PRN Norval Morton, MD         Discharge Medications: Please see discharge summary for a list of discharge medications.  Relevant Imaging Results:  Relevant Lab Results:   Additional Information SS#: [id number]  Candie Chroman, LCSW

## 2017-04-25 NOTE — Progress Notes (Signed)
Pt's family yet to arrive to take her home; handed this info off in report.   [place]  Ariana Glock, RN

## 2017-04-25 NOTE — Care Management Note (Addendum)
Case Management Note  Patient Details  Name: Ariana [name] MRN: [id number] Date of Birth: 1929-07-02  Subjective/Objective:        Pt admitted with A fib with RVR           Action/Plan:   PTA from home.  Pt informed CM that she has care giver 8-5pm M-F from [employer].  Pt states that her son-in-law provides care on the weekend as needed.  Pt uses both walker and cane when home and is transported by aide to appts during the week.     Expected Discharge Date:  04/25/17               Expected Discharge Plan:  Skilled Nursing Facility  In-House Referral:  Clinical Social Work  Discharge planning Services  CM Consult  Post Acute Care Choice:    Choice offered to:  Patient, Adult Children  DME Arranged:    DME Agency:     HH Arranged:  RN, PT, OT, Social Work [employer] Agency:  [employer] (now [facility])  Status of Service:  Completed, signed off  If discussed at [employer], dates discussed:    Additional Comments: 04/25/2017  Update:  CM contacted daughter per pts request regarding [facility] as Pt/daughter refused options given for SNF.  Daughter plans to to take pt home with [facility] and resumption of aid via Engineer, civil (consulting).  Daughter instructed CM to obtain [facility] through [facility] - agency contacted and referral accepted.  CM informed daughter that [facility] will not provide 24/7 supervision and reiterated the recommendation for 24 hour supervision - daughter informed CM that she will contact [place] and pay for additional hours to ensure pt has recommended supervision upon discharge. Attending made aware of discharge home.  CM requested [place] orders/face to face from attending    SNF recommended - CM discussed recommendation with pt and pt confirmed she will not have 24 hour supervision at home, pt will like to move forward with SNF placement. Insurance auth initiated.  Physician Advisor has approved 5 day LOG to allow pt to be able to  discharge to SNF today - CSW informed Maryclare Labrador, RN 04/25/2017, 3:03 PM

## 2017-04-25 NOTE — Discharge Summary (Signed)
Physician Discharge Summary  Ariana [name] ACZ:[id number] DOB: [birth year] DOA: 04/23/2017  PCP: Gayland Curry, DO  Admit date: 04/23/2017 Discharge date: 04/25/2017  Time spent: > 35 minutes  Recommendations for Outpatient Follow-up:  1.    Discharge Diagnoses:  Principal Problem:   Atrial fibrillation with RVR (HCC) Active Problems:   ANXIETY DEPRESSION   Mild dementia   History of cerebrovascular disease   High risk medication use   A-fib (HCC)   Diabetes mellitus type 2 in nonobese [facility])   Discharge Condition: stable  Diet recommendation: Heart healthy  Filed Weights   04/23/17 1756 04/24/17 0845  Weight: 71.7 kg (158 lb) 71.7 kg (158 lb 1.1 oz)    History of present illness:  84 y.o.female with medical history significant ofHTN, PAF not on AC due to frequent falls,MVP,CVA, dementia, Sjogren's syndrome,and DM type II presents from [facility] care after being found to be in A. fib with RVR by her PCP during her routine visit    Hospital Course:  Per cardiology notes:  1.Atrial flutter/tachycardia, history of atrial fibrillation: -Asymptomatic, rate controlled, not a candidate for anticoagulation, despite history of embolic CVA. Continue current meds for rate control 2.Dementia: -Followed by her PCP who feels that this is worsening -Has had multiple falls and therefore is not currently candidate for anticoagulation -Willlikelyneed social work follow-up to help with discharge assistance per primary 3. Chronic diastolic heart failure: -Per echocardiogram on 07/27/2014 normal EF 55-60%  -Echocardiogram pending on current admission -appears euvolemic clinically 4. HTN: -consistently in desirable range  I will continue prior to admission medication regimen and currently we are trying to get patient into SNF. There is a chance however she may transition to home. I will discontinue opioids as patient is taking benzodiazepines. Patient has Lyrica and  acetaminophen for pain control and this could be escalated on her controlled environment as an outpatient with close monitoring from primary care physician.  Procedures:  None  Consultations:  Cardiology  Discharge Exam: Vitals:   04/25/17 0700 04/25/17 1112  BP: 116/86 (!) 118/99  Pulse:    Resp: 18   Temp: (!) 97.4 F (36.3 C) 98.3 F (36.8 C)  SpO2:  97%    General: Pt in nad, alert and awake Cardiovascular: s1 and s2 present Respiratory: no increased wob, no wheezes  Discharge Instructions   Discharge Instructions    Call MD for:  severe uncontrolled pain   Complete by:  As directed    Diet - low sodium heart healthy   Complete by:  As directed    Discharge instructions   Complete by:  As directed    Please decide whether or not to continue opiods at SNF or if patient transitions home at home. As she is on xanax I am concerned about polypharmacy on discharge which may cause confusion or may predispose to a fall. As such will hold opiod on d/c. Patient has lyrica and acetaminophen   Increase activity slowly   Complete by:  As directed      Allergies as of 04/25/2017      Reactions   Banana Nausea And Vomiting   Codeine Nausea Only   unless given with Phenergan   Klonopin [clonazepam] Other (See Comments)   Causes hallucination   Meperidine Hcl Nausea Only   unless given with Phenergan   Norflex [orphenadrine Citrate] Nausea Only   Unless given with Phenergan   Oxycodone-acetaminophen Nausea Only   unless given with phenergan   Propoxyphene Hcl  Nausea Only   unless given with phenergan   Zoloft [sertraline Hcl] Other (See Comments)   Caused lethargy   Doxycycline Other (See Comments)   Unknown reaction   Naproxen Other (See Comments)   Unknown reaction   Penicillins Other (See Comments)   Unknown reaction Has patient had a PCN reaction causing immediate rash, facial/tongue/throat swelling, SOB or lightheadedness with hypotension: Unknown Has patient  had a PCN reaction causing severe rash involving mucus membranes or skin necrosis: Unknown Has patient had a PCN reaction that required hospitalization: pt was in the hospital at time of reaction Has patient had a PCN reaction occurring within the last 10 years: Unknown If all of the above answers are "NO", then may proceed with Cephalos   Phenothiazines Other (See Comments)   Unknown reaction   Stelazine Other (See Comments)   Unknown reaction   Sulfamethoxazole-trimethoprim Other (See Comments)   Unknown reaction   Tolectin [tolmetin Sodium] Other (See Comments)   Unknown reaction   Tramadol Other (See Comments)   Unknown reaction      Medication List    STOP taking these medications   HYDROcodone-acetaminophen 5-325 MG tablet Commonly known as:  NORCO/VICODIN     TAKE these medications   ALPRAZolam 0.25 MG tablet Commonly known as:  XANAX Take 1 tablet (0.25 mg total) by mouth 2 (two) times daily as needed. What changed:    how much to take  how to take this  when to take this  reasons to take this   antiseptic oral rinse Liqd 15 mLs by Mouth Rinse route 5 (five) times daily as needed for dry mouth.   aspirin 325 MG tablet Take 1 tablet (325 mg total) by mouth daily. What changed:  when to take this   CARTIA XT 180 MG 24 hr capsule Generic drug:  diltiazem TAKE ONE CAPSULE BY MOUTH ONCE DAILY What changed:    how much to take  how to take this  when to take this   DULoxetine 30 MG capsule Commonly known as:  CYMBALTA TAKE 1 CAPSULE BY MOUTH ONCE DAILY FOR  BACK  PAIN  AND  DEPRESSION What changed:  See the new instructions.   DULoxetine 60 MG capsule Commonly known as:  CYMBALTA Take 1 capsule (60 mg total) by mouth daily. What changed:  when to take this   furosemide 40 MG tablet Commonly known as:  LASIX TAKE 1 TABLET BY MOUTH ONCE DAILY What changed:    how much to take  how to take this  when to take this   hydroxychloroquine 200 MG  tablet Commonly known as:  PLAQUENIL Take 200mg  po qd What changed:    how much to take  how to take this  when to take this  additional instructions   KLOR-CON M20 20 MEQ tablet Generic drug:  potassium chloride SA TAKE 1 TABLET BY MOUTH ONCE DAILY What changed:    how much to take  how to take this  when to take this   lansoprazole 30 MG capsule Commonly known as:  PREVACID TAKE 1 CAPSULE BY MOUTH ONCE DAILY BEFORE BREAKFAST What changed:  See the new instructions.   LINZESS 290 MCG Caps capsule Generic drug:  linaclotide TAKE 1 CAPSULE BY MOUTH ONCE DAILY BEFORE BREAKFAST What changed:  See the new instructions.   LYRICA 75 MG capsule Generic drug:  pregabalin TAKE 1 CAPSULE BY MOUTH TWICE DAILY AS NEEDED FOR PAIN What changed:  See the new instructions.  memantine 10 MG tablet Commonly known as:  NAMENDA TAKE ONE TABLET BY MOUTH TWICE DAILY What changed:    how much to take  how to take this  when to take this   mirabegron ER 25 MG Tb24 tablet Commonly known as:  MYRBETRIQ Take 1 tablet (25 mg total) by mouth daily.   pilocarpine 5 MG tablet Commonly known as:  SALAGEN Take 1 tablet (5 mg total) by mouth 2 (two) times daily. What changed:  additional instructions   polyethylene glycol packet Commonly known as:  MIRALAX / GLYCOLAX Take 17 g by mouth daily as needed (constipation). Mix in 8 oz liquid and drink   REFRESH TEARS 0.5 % Soln Generic drug:  carboxymethylcellulose Place 1 drop into both eyes 5 (five) times daily as needed (dry eyes).   senna 8.6 MG Tabs tablet Commonly known as:  SENOKOT Take 1 tablet (8.6 mg total) by mouth daily.   sodium chloride 0.65 % Soln nasal spray Commonly known as:  OCEAN Place 1 spray into the nose as needed for congestion. What changed:    how to take this  when to take this   TYLENOL 325 MG tablet Generic drug:  acetaminophen Take 650 mg by mouth daily as needed for headache (pain).       Allergies  Allergen Reactions  . Banana Nausea And Vomiting  . Codeine Nausea Only    unless given with Phenergan  . Klonopin [Clonazepam] Other (See Comments)    Causes hallucination   . Meperidine Hcl Nausea Only    unless given with Phenergan  . Norflex [Orphenadrine Citrate] Nausea Only    Unless given with Phenergan  . Oxycodone-Acetaminophen Nausea Only    unless given with phenergan  . Propoxyphene Hcl Nausea Only    unless given with phenergan  . Zoloft [Sertraline Hcl] Other (See Comments)    Caused lethargy  . Doxycycline Other (See Comments)    Unknown reaction  . Naproxen Other (See Comments)    Unknown reaction  . Penicillins Other (See Comments)    Unknown reaction Has patient had a PCN reaction causing immediate rash, facial/tongue/throat swelling, SOB or lightheadedness with hypotension: Unknown Has patient had a PCN reaction causing severe rash involving mucus membranes or skin necrosis: Unknown Has patient had a PCN reaction that required hospitalization: pt was in the hospital at time of reaction Has patient had a PCN reaction occurring within the last 10 years: Unknown If all of the above answers are "NO", then may proceed with Cephalos  . Phenothiazines Other (See Comments)    Unknown reaction  . Stelazine Other (See Comments)    Unknown reaction  . Sulfamethoxazole-Trimethoprim Other (See Comments)    Unknown reaction  . Tolectin [Tolmetin Sodium] Other (See Comments)    Unknown reaction  . Tramadol Other (See Comments)    Unknown reaction      The results of significant diagnostics from this hospitalization (including imaging, microbiology, ancillary and laboratory) are listed below for reference.    Significant Diagnostic Studies: Ct Abdomen Pelvis W Contrast  Result Date: 04/24/2017 CLINICAL DATA:  84 year old female with upper abdominal pain extending down right side with bloating since yesterday. Prior cholecystectomy, hysterectomy and  appendectomy. Initial encounter. EXAM: CT ABDOMEN AND PELVIS WITH CONTRAST TECHNIQUE: Multidetector CT imaging of the abdomen and pelvis was performed using the standard protocol following bolus administration of intravenous contrast. CONTRAST:  160mL ISOVUE-300 IOPAMIDOL (ISOVUE-300) INJECTION 61% COMPARISON:  04/18/2015 CT. FINDINGS: Lower chest: Scarring/minimal atelectasis  lung bases. Hepatobiliary: Fatty enlarged liver spanning over 19.1 cm. No worrisome hepatic lesion. Post cholecystectomy. Common bile duct measures 8.6 mm consistent with patient's age and post cholecystectomy state. No calcified common bile duct stone. Pancreas: No pancreatic mass or inflammation. Spleen: No splenic mass or enlargement. Adrenals/Urinary Tract: No obstructing stone or hydronephrosis. No worrisome renal or adrenal lesion. Noncontrast filled contracted urinary bladder without gross abnormality. Stomach/Bowel: Sigmoid diverticulosis without evidence of diverticulitis. Areas of narrowing of the colon which may be related to peristalsis although limiting evaluation for detection of a small mass. Anterior aspect of transverse colon extends into periumbilical hernia but does not cause obstruction. Small hiatal hernia.  No gastric or duodenal abnormality noted. Vascular/Lymphatic: Superior aspect of the inferior vena cava is narrowed including narrowing through the liver. No web like stenosis such as seen with Budd-Chiari. Mild atherosclerotic changes abdominal aorta without aneurysm. No large arterial vessel occlusion. No adenopathy. Reproductive: Post hysterectomy.  No adnexal mass noted. Other: Lax abdominal musculature. Left inguinal canal fat containing hernia. No free intraperitoneal air. Musculoskeletal: Degenerative changes L5-S1.  Hemangioma L3. IMPRESSION: Fatty enlarged liver spanning over 19.1 cm. Post cholecystectomy. Sigmoid diverticulosis. Anterior aspect of transverse colon extends into periumbilical hernia but does  not cause obstruction. Small hiatal hernia. Superior aspect of the inferior vena cava is narrowed including narrowing through the liver. No web like stenosis such as seen with Budd-Chiari. Post hysterectomy. Degenerative changes L5-S1. Electronically Signed   By: Genia Del M.D.   On: 04/24/2017 18:01   Dg Chest Port 1 View  Result Date: 04/23/2017 CLINICAL DATA:  Shortness of Breath EXAM: PORTABLE CHEST 1 VIEW COMPARISON:  11/29/2016 FINDINGS: Cardiac shadow is stable. Aortic calcifications are again seen. The lungs are well aerated bilaterally. No focal infiltrate is seen. Minimal platelike atelectasis is noted in the left base. IMPRESSION: Minimal left basilar atelectasis. Electronically Signed   By: Inez Catalina M.D.   On: 04/23/2017 18:16    Microbiology: Recent Results (from the past 240 hour(s))  MRSA PCR Screening     Status: None   Collection Time: 04/24/17  8:48 AM  Result Value Ref Range Status   MRSA by PCR NEGATIVE NEGATIVE Final    Comment:        The GeneXpert MRSA Assay (FDA approved for NASAL specimens only), is one component of a comprehensive MRSA colonization surveillance program. It is not intended to diagnose MRSA infection nor to guide or monitor treatment for MRSA infections. Performed at [facility], [place] [address] [place], [place] [address]      Labs: Basic Metabolic Panel: Recent Labs  Lab 04/23/17 1758 04/23/17 2027 04/24/17 0251 04/24/17 0814 04/25/17 0636  NA 140  --  138  --  138  K 4.2  --  4.0  --  3.9  CL 104  --  104  --  105  CO2 24  --  23  --  24  GLUCOSE 183*  --  147*  --  160*  BUN 20  --  15  --  16  CREATININE 1.01*  --  0.83  --  0.84  CALCIUM 9.4  --  9.0  --  8.9  MG  --  2.0  --  2.0  --    Liver Function Tests: Recent Labs  Lab 04/23/17 1758  AST 25  ALT 24  ALKPHOS 81  BILITOT 0.7  PROT 6.7  ALBUMIN 3.7   No results for input(s): LIPASE, AMYLASE in  the last 168 hours. No results for input(s): AMMONIA  in the last 168 hours. CBC: Recent Labs  Lab 04/23/17 1758 04/24/17 0251 04/25/17 0636  WBC 7.3 7.2 6.6  NEUTROABS 3.7  --  3.6  HGB 13.2 13.2 13.9  HCT 41.0 40.4 42.2  MCV 93.2 92.4 92.3  PLT 218 205 220   Cardiac Enzymes: Recent Labs  Lab 04/23/17 2027 04/24/17 0251 04/24/17 0814  TROPONINI <0.03 <0.03 <0.03   BNP: BNP (last 3 results) Recent Labs    11/29/16 2152  BNP 32.1    ProBNP (last 3 results) No results for input(s): PROBNP in the last 8760 hours.  CBG: Recent Labs  Lab 04/24/17 1207 04/24/17 1717 04/24/17 2134 04/25/17 0846 04/25/17 1122  GLUCAP 207* 131* 177* 168* 196*    Signed:  Velvet Bathe MD.  [facility] 04/25/2017, 1:39 PM

## 2017-04-25 NOTE — Progress Notes (Signed)
Family has been given time to come pick up pt; by social work note family didn't want to be called again, however, RN called to find out time frame that daughter would arrive to take pt home. Daughter said she will be another hour. Will continue to monitor.   [place]  Ayrabella Labombard, RN

## 2017-04-25 NOTE — Progress Notes (Signed)
Pt still refusing to sign off on d/c paperwork; says she will sign off once her daughter and son-in-law are here to pick her up. Will continue to monitor.   [place]  Brittony Billick, RN

## 2017-04-25 NOTE — Progress Notes (Signed)
Will d/c pt once destination or home health confirmed. Will continue to monitor.   [place]  Kyleeann Cremeans, RN

## 2017-04-25 NOTE — Progress Notes (Signed)
  Echocardiogram 2D Echocardiogram has been performed.  Riki Berninger L Androw 04/25/2017, 11:10 AM

## 2017-04-25 NOTE — Clinical Social Work Note (Addendum)
CSW received call from [facility] relaying message from medical director of RNCM/CSW dept that patient can discharge to SNF today with 5-day LOG while waiting on insurance authorization. CSW called patient's daughter to notify. She was very upset because she was under the impression patient would discharge tomorrow. Explained that patient is under observation status and not receiving any IV medications to create a barrier to discharge. Discussed LOG process and bed offers from facilities that take LOG's. Patient's daughter does not want any of these facilities and prefers for patient to return home with home health including a home health social worker to work on placement from home. RNCM notified. Patient's daughter and son-in-law will pick her up today and do not want staff frequently calling them to see when they will be here to pick her up.  Dayton Scrape, [place] [phone]  3:28 pm  See RNCM's note regarding details for discharge home with home health today.  CSW signing off.   [place] Scrape, [place]

## 2017-04-25 NOTE — Progress Notes (Signed)
Pt's IV's removed; belongings collected; educated on d/c instructions; will be wheeled out once ride arrives. Will continue to monitor.   [place]  Deborra Phegley, RN

## 2017-04-25 NOTE — Discharge Instructions (Signed)

## 2017-04-26 NOTE — Progress Notes (Signed)
Pt daughter arrived at bedside and reviewed paper work and medications. They stated they understood d/c paper work and signed paper work. Pt was dressed and wheeled via wheel chair off the unit to car by RN. All belonging went with pt.

## 2017-04-27 DIAGNOSIS — M3500 Sicca syndrome, unspecified: Secondary | ICD-10-CM | POA: Diagnosis not present

## 2017-04-27 DIAGNOSIS — E1142 Type 2 diabetes mellitus with diabetic polyneuropathy: Secondary | ICD-10-CM | POA: Diagnosis not present

## 2017-04-27 DIAGNOSIS — I4892 Unspecified atrial flutter: Secondary | ICD-10-CM | POA: Diagnosis not present

## 2017-04-27 DIAGNOSIS — I5032 Chronic diastolic (congestive) heart failure: Secondary | ICD-10-CM | POA: Diagnosis not present

## 2017-04-27 DIAGNOSIS — F0390 Unspecified dementia without behavioral disturbance: Secondary | ICD-10-CM | POA: Diagnosis not present

## 2017-04-27 DIAGNOSIS — I480 Paroxysmal atrial fibrillation: Secondary | ICD-10-CM | POA: Diagnosis not present

## 2017-04-27 DIAGNOSIS — I110 Hypertensive heart disease with heart failure: Secondary | ICD-10-CM | POA: Diagnosis not present

## 2017-04-27 DIAGNOSIS — F411 Generalized anxiety disorder: Secondary | ICD-10-CM | POA: Diagnosis not present

## 2017-04-29 ENCOUNTER — Emergency Department (HOSPITAL_COMMUNITY): Payer: Medicare Other

## 2017-04-29 ENCOUNTER — Encounter (HOSPITAL_COMMUNITY): Payer: Self-pay | Admitting: Student

## 2017-04-29 ENCOUNTER — Emergency Department (HOSPITAL_COMMUNITY)
Admission: EM | Admit: 2017-04-29 | Discharge: 2017-04-29 | Disposition: A | Discharge status: Home / Self Care | Payer: Medicare Other | Attending: Emergency Medicine | Admitting: Emergency Medicine

## 2017-04-29 ENCOUNTER — Other Ambulatory Visit: Payer: Self-pay

## 2017-04-29 DIAGNOSIS — I5030 Unspecified diastolic (congestive) heart failure: Secondary | ICD-10-CM | POA: Diagnosis not present

## 2017-04-29 DIAGNOSIS — F0390 Unspecified dementia without behavioral disturbance: Secondary | ICD-10-CM | POA: Diagnosis not present

## 2017-04-29 DIAGNOSIS — I110 Hypertensive heart disease with heart failure: Secondary | ICD-10-CM | POA: Diagnosis not present

## 2017-04-29 DIAGNOSIS — Z8673 Personal history of transient ischemic attack (TIA), and cerebral infarction without residual deficits: Secondary | ICD-10-CM | POA: Insufficient documentation

## 2017-04-29 DIAGNOSIS — R0602 Shortness of breath: Secondary | ICD-10-CM | POA: Diagnosis not present

## 2017-04-29 DIAGNOSIS — N3000 Acute cystitis without hematuria: Secondary | ICD-10-CM

## 2017-04-29 DIAGNOSIS — R4182 Altered mental status, unspecified: Secondary | ICD-10-CM | POA: Diagnosis not present

## 2017-04-29 DIAGNOSIS — R1013 Epigastric pain: Secondary | ICD-10-CM | POA: Insufficient documentation

## 2017-04-29 DIAGNOSIS — Z79899 Other long term (current) drug therapy: Secondary | ICD-10-CM | POA: Diagnosis not present

## 2017-04-29 DIAGNOSIS — Z87891 Personal history of nicotine dependence: Secondary | ICD-10-CM | POA: Diagnosis not present

## 2017-04-29 DIAGNOSIS — R079 Chest pain, unspecified: Secondary | ICD-10-CM | POA: Diagnosis not present

## 2017-04-29 DIAGNOSIS — R109 Unspecified abdominal pain: Secondary | ICD-10-CM | POA: Diagnosis not present

## 2017-04-29 DIAGNOSIS — R0789 Other chest pain: Secondary | ICD-10-CM | POA: Diagnosis not present

## 2017-04-29 DIAGNOSIS — E119 Type 2 diabetes mellitus without complications: Secondary | ICD-10-CM | POA: Diagnosis not present

## 2017-04-29 DIAGNOSIS — Z7982 Long term (current) use of aspirin: Secondary | ICD-10-CM | POA: Insufficient documentation

## 2017-04-29 LAB — BASIC METABOLIC PANEL
ANION GAP: 9 (ref 5–15)
BUN: 23 mg/dL — ABNORMAL HIGH (ref 6–20)
CALCIUM: 9.4 mg/dL (ref 8.9–10.3)
CO2: 26 mmol/L (ref 22–32)
CREATININE: 0.91 mg/dL (ref 0.44–1.00)
Chloride: 105 mmol/L (ref 101–111)
GFR, EST NON AFRICAN AMERICAN: 56 mL/min — AB (ref >60)
Glucose, Bld: 181 mg/dL — ABNORMAL HIGH (ref 65–99)
Potassium: 3.9 mmol/L (ref 3.5–5.1)
SODIUM: 140 mmol/L (ref 135–145)

## 2017-04-29 LAB — I-STAT TROPONIN, ED
TROPONIN I, POC: 0 ng/mL (ref 0.00–0.08)
Troponin i, poc: 0.01 ng/mL (ref 0.00–0.08)

## 2017-04-29 LAB — URINALYSIS, ROUTINE W REFLEX MICROSCOPIC
Bilirubin Urine: NEGATIVE
GLUCOSE, UA: NEGATIVE mg/dL
Hgb urine dipstick: NEGATIVE
Ketones, ur: NEGATIVE mg/dL
Nitrite: POSITIVE — AB
PROTEIN: NEGATIVE mg/dL
SPECIFIC GRAVITY, URINE: 1.019 (ref 1.005–1.030)
Squamous Epithelial / LPF: NONE SEEN
pH: 5 (ref 5.0–8.0)

## 2017-04-29 LAB — CBC
HCT: 39.3 % (ref 36.0–46.0)
Hemoglobin: 12.8 g/dL (ref 12.0–15.0)
MCH: 30.1 pg (ref 26.0–34.0)
MCHC: 32.6 g/dL (ref 30.0–36.0)
MCV: 92.5 fL (ref 78.0–100.0)
PLATELETS: 210 10*3/uL (ref 150–400)
RBC: 4.25 MIL/uL (ref 3.87–5.11)
RDW: 13.9 % (ref 11.5–15.5)
WBC: 6.7 10*3/uL (ref 4.0–10.5)

## 2017-04-29 LAB — HEPATIC FUNCTION PANEL
ALK PHOS: 71 U/L (ref 38–126)
ALT: 26 U/L (ref 14–54)
AST: 27 U/L (ref 15–41)
Albumin: 3.6 g/dL (ref 3.5–5.0)
Total Bilirubin: 0.5 mg/dL (ref 0.3–1.2)
Total Protein: 6.6 g/dL (ref 6.5–8.1)

## 2017-04-29 LAB — LIPASE, BLOOD: Lipase: 65 U/L — ABNORMAL HIGH (ref 11–51)

## 2017-04-29 MED ORDER — CEPHALEXIN 500 MG PO CAPS: 500.0000 mg | ORAL_CAPSULE | Freq: Two times a day (BID) | ORAL | 0 refills | Status: DC

## 2017-04-29 MED ORDER — SODIUM CHLORIDE 0.9 % IV SOLN
1.0000 g | Freq: Once | INTRAVENOUS | Status: AC
  Administered 2017-04-29: 1 g via INTRAVENOUS
  Filled 2017-04-29: qty 10

## 2017-04-29 MED ORDER — IOPAMIDOL (ISOVUE-300) INJECTION 61%
INTRAVENOUS | Status: AC
  Administered 2017-04-29: 100 mL
  Filled 2017-04-29: qty 100

## 2017-04-29 MED ORDER — GI COCKTAIL ~~LOC~~
30.0000 mL | Freq: Once | ORAL | Status: AC
  Administered 2017-04-29: 30 mL via ORAL
  Filled 2017-04-29: qty 30

## 2017-04-29 NOTE — ED Notes (Signed)
Daughter notified that pt is returning home via [place]

## 2017-04-29 NOTE — ED Provider Notes (Signed)
[facility] EMERGENCY DEPARTMENT Provider Note   CSN: [id number] Arrival date & time: 04/29/17  0348     History   Chief Complaint No chief complaint on file.   HPI Ariana [name] is a 84 y.o. female.  HPI   84 year old female with history of fibromyalgia, GERD, dementia, hypertension, hyperlipidemia, diabetes, A. fib not on anticoagulant due to frequent falls, brought here via EMS from home for evaluation of chest pain and shortness of breath.  Per triage note, patient developed chest pain that started at noon yesterday got progressively worse with shortness of breath prior to arrival.  Patient was given 324 mg of aspirin and 2 sublingual nitro tabs with improvement of chest pain.  However, patient reports to me that her pain is primarily in her abdomen.  Patient mentioned since yesterday she has had some urinary incontinence with burning on urination and lower abdominal pain.  The pain seems to spread towards her epigastrium region and radiates to her back.  Pain is waxing and waning, currently rates as 4 out of 10.  Endorsed feeling a bit constipated.  No report of fever, chills, lightheadedness, dizziness, exertional chest pain, productive cough, hemoptysis.  Patient is a poor historian  Past Medical History:  Diagnosis Date  . Abnormality of gait   . Adenomatous polyp of colon 2002   80mm  . Allergic rhinitis   . Anxiety   . Anxiety and depression   . Chronic back pain   . Dementia without behavioral disturbance   . Depression   . Diverticulosis of colon   . Dry eye syndrome   . Dysphagia   . Dysthymic disorder   . Fibromyalgia   . GERD (gastroesophageal reflux disease)   . H/O hiatal hernia   . History of adverse drug reaction   . History of cerebrovascular disease 09/24/2014  . History of recurrent UTIs   . Hypertension, benign   . Irritable bowel syndrome   . Low back pain syndrome   . Memory loss   . Mitral valve prolapse   . Paroxysmal A-fib  ([place])   . Peripheral neuropathy    "both feet and legs"  . Physical deconditioning   . Sjogren's syndrome ([place])   . Therapeutic opioid-induced constipation (OIC)   . Thyroid nodule   . Urinary incontinence     Patient Active Problem List   Diagnosis Date Noted  . A-fib ([place]) 04/23/2017  . Diabetes mellitus type 2 in nonobese ([place]) 04/23/2017  . Sjogren's disease ([place]) 04/03/2016  . Primary osteoarthritis of both hands 04/03/2016  . High risk medication use 04/03/2016  . Senile dementia, with behavioral disturbance 08/16/2015  . Swelling 08/16/2015  . Diastolic dysfunction 25/36/6440  . Hypertonicity, bladder 07/08/2015  . Arterial hypotension   . Bradycardia 06/29/2015  . Chronic lower back pain 06/29/2015  . PAF (paroxysmal atrial fibrillation) ([place]) 04/18/2015  . Dehydration 04/18/2015  . Dementia without behavioral disturbance 04/18/2015  . TIA (transient ischemic attack) 12/17/2014  . Chest pain 12/16/2014  . Acute encephalopathy 12/04/2014  . Fall   . AKI (acute kidney injury) ([place]) 12/03/2014  . History of cerebrovascular disease 09/24/2014  . Falls frequently 07/28/2014  . Infarction of parietal lobe   . Mild dementia   . CVA (cerebral infarction) 07/26/2014  . Atrial fibrillation with RVR ([place]) 07/03/2014  . Constipation 04/15/2014  . Mixed stress and urge urinary incontinence 11/03/2013  . Therapeutic opioid induced constipation 11/03/2013  . Hemorrhoid 11/03/2013  . Low  back pain associated with a spinal disorder other than radiculopathy or spinal stenosis 11/03/2013  . Protein-calorie malnutrition, severe ([place]) 07/22/2013  . UTI (lower urinary tract infection) 07/20/2013  . Prolonged QT interval 07/20/2013  . Osteopenia 07/17/2013  . Palpitations 06/30/2013  . Hereditary and idiopathic peripheral neuropathy 05/22/2013  . Abnormality of gait 12/05/2012  . Headache(784.0) 09/05/2012  . Multinodular thyroid 01/15/2012  . Neck pain 01/15/2012  .  Hypercholesterolemia 07/08/2010  . Tear film insufficiency 07/06/2009  . [place] SYNDROME 07/06/2009  . Urge urinary incontinence 01/06/2009  . COLONIC POLYPS, ADENOMATOUS, HX OF 04/15/2008  . MITRAL VALVE PROLAPSE 11/05/2007  . ANXIETY DEPRESSION 07/02/2007  . Mononeuritis 07/02/2007  . HYPERTENSION, BENIGN 07/02/2007  . GERD 07/02/2007  . Irritable bowel syndrome 07/02/2007  . Fibromyalgia 07/02/2007    Past Surgical History:  Procedure Laterality Date  . ABDOMINAL HYSTERECTOMY  1967  . APPENDECTOMY    . CARDIAC CATHETERIZATION  02/17/2003   normal L main, LAD free of disease, Cfx free of disease, RCA free of disease (Dr. Loni Muse. Little)  . CATARACT EXTRACTION, BILATERAL    . CHOLECYSTECTOMY  2000  . COLONOSCOPY W/ BIOPSIES     multiple  . DENTAL SURGERY     multiple tooth extractions  . ESOPHAGOGASTRODUODENOSCOPY (EGD) WITH ESOPHAGEAL DILATION N/A 08/23/2012   Procedure: ESOPHAGOGASTRODUODENOSCOPY (EGD) WITH ESOPHAGEAL DILATION;  Surgeon: Milus Banister, MD;  Location: [facility] ENDOSCOPY;  Service: Endoscopy;  Laterality: N/A;  . NASAL SEPTUM SURGERY  1980  . NM MYOCAR PERF WALL MOTION  2003   persantine - normal static and dynamic study w/apical thinning and presvered LV function, no ischemia  . SINUS EXPLORATION     ossifiying fibroma  . TEMPOROMANDIBULAR JOINT SURGERY  1986   Dr. Terence Lux  . TRANSTHORACIC ECHOCARDIOGRAM  2001   mild LVH, normal LV    OB History    No data available       Home Medications    Prior to Admission medications   Medication Sig Start Date End Date Taking? Authorizing Provider  acetaminophen (TYLENOL) 325 MG tablet Take 650 mg by mouth daily as needed for headache (pain).    [provider]  ALPRAZolam Duanne Moron) 0.25 MG tablet Take 1 tablet (0.25 mg total) by mouth 2 (two) times daily as needed. 04/25/17   Velvet Bathe, MD  antiseptic oral rinse (BIOTENE) LIQD 15 mLs by Mouth Rinse route 5 (five) times daily as needed for dry mouth.     [provider]  aspirin 325 MG tablet Take 1 tablet (325 mg total) by mouth daily. Patient taking differently: Take 325 mg by mouth daily with supper.  07/28/14   Reyne Dumas, MD  carboxymethylcellulose (REFRESH TEARS) 0.5 % SOLN Place 1 drop into both eyes 5 (five) times daily as needed (dry eyes).     [provider]  CARTIA XT 180 MG 24 hr capsule TAKE ONE CAPSULE BY MOUTH ONCE DAILY Patient taking differently: TAKE ONE CAPSULE (180 MG) BY MOUTH ONCE DAILY 01/15/17   Hilty, Nadean Corwin, MD  DULoxetine (CYMBALTA) 30 MG capsule TAKE 1 CAPSULE BY MOUTH ONCE DAILY FOR  BACK  PAIN  AND  DEPRESSION Patient taking differently: TAKE 1 CAPSULE (30 MG) BY MOUTH EVERY MORNING FOR  BACK  PAIN  AND  DEPRESSION 02/02/17   Reed, Tiffany L, DO  DULoxetine (CYMBALTA) 60 MG capsule Take 1 capsule (60 mg total) by mouth daily. Patient taking differently: Take 60 mg by mouth daily with supper.  03/29/17  Reed, Tiffany L, DO  furosemide (LASIX) 40 MG tablet TAKE 1 TABLET BY MOUTH ONCE DAILY Patient taking differently: TAKE 1 TABLET (40 MG) BY MOUTH ONCE DAILY 02/09/17   Reed, Tiffany L, DO  hydroxychloroquine (PLAQUENIL) 200 MG tablet Take 200mg  po qd Patient taking differently: Take 200 mg by mouth daily.  03/08/17   Deveshwar, Abel Presto, MD  KLOR-CON M20 20 MEQ tablet TAKE 1 TABLET BY MOUTH ONCE DAILY Patient taking differently: TAKE 1 TABLET (20 MEQ) BY MOUTH ONCE DAILY 03/30/17   Reed, Tiffany L, DO  lansoprazole (PREVACID) 30 MG capsule TAKE 1 CAPSULE BY MOUTH ONCE DAILY BEFORE BREAKFAST Patient taking differently: TAKE 1 CAPSULE (30 MG) BY MOUTH DAILY 03/12/17   Reed, Tiffany L, DO  LINZESS 290 MCG CAPS capsule TAKE 1 CAPSULE BY MOUTH ONCE DAILY BEFORE BREAKFAST Patient taking differently: TAKE 1 CAPSULE (290 MCG) BY MOUTH DAILY 04/13/17   Gatha Mayer, MD  LYRICA 75 MG capsule TAKE 1 CAPSULE BY MOUTH TWICE DAILY AS NEEDED FOR PAIN Patient taking differently: TAKE 1 CAPSULE (75 MG) BY MOUTH  TWICE DAILY  -9A AND 5P 02/09/17   Reed, Tiffany L, DO  memantine (NAMENDA) 10 MG tablet TAKE ONE TABLET BY MOUTH TWICE DAILY Patient taking differently: TAKE ONE TABLET (10 MG) BY MOUTH TWICE DAILY 9a and 5p 05/30/16   Reed, Tiffany L, DO  mirabegron ER (MYRBETRIQ) 25 MG TB24 tablet Take 1 tablet (25 mg total) by mouth daily. 03/29/17   Reed, Tiffany L, DO  pilocarpine (SALAGEN) 5 MG tablet Take 1 tablet (5 mg total) by mouth 2 (two) times daily. Patient taking differently: Take 5 mg by mouth 2 (two) times daily. 9a and 5p 11/08/16   Bo Merino, MD  polyethylene glycol (MIRALAX / GLYCOLAX) packet Take 17 g by mouth daily as needed (constipation). Mix in 8 oz liquid and drink    [provider]  senna (SENOKOT) 8.6 MG TABS tablet Take 1 tablet (8.6 mg total) by mouth daily. 03/29/17   Reed, Tiffany L, DO  sodium chloride (OCEAN) 0.65 % SOLN nasal spray Place 1 spray into the nose as needed for congestion. Patient taking differently: Place 1 spray into both nostrils 5 (five) times daily as needed for congestion.  08/23/12   Hongalgi, Lenis Dickinson, MD  magic mouthwash w/lidocaine SOLN Take 5 mLs by mouth 4 (four) times daily.  09/06/15  [provider]    Family History Family History  Problem Relation Age of Onset  . Heart disease Father        heart attack  . Pneumonia Father   . Heart attack Mother   . Hypertension Mother   . Colon cancer Sister   . Kidney disease Daughter   . Asthma Daughter   . Arthritis Daughter 69       osteo,  . Heart disease Son 36       stage 3 CHF(Diastolic /Systolic)  . Throat cancer Brother   . Hypertension Maternal Grandmother     Social History Social History   Tobacco Use  . Smoking status: Former Research scientist (life sciences)  . Smokeless tobacco: Never Used  . Tobacco comment: Quit at age 76   Substance Use Topics  . Alcohol use: No  . Drug use: No     Allergies   Banana; Codeine; Klonopin [clonazepam]; Meperidine hcl; Norflex [orphenadrine  citrate]; Oxycodone-acetaminophen; Propoxyphene hcl; Zoloft [sertraline hcl]; Doxycycline; Naproxen; Penicillins; Phenothiazines; Stelazine; Sulfamethoxazole-trimethoprim; Tolectin [tolmetin sodium]; and Tramadol   Review of Systems Review  of Systems  All other systems reviewed and are negative.    Physical Exam Updated Vital Signs There were no vitals taken for this visit.  Physical Exam  Constitutional: She appears well-developed and well-nourished. No distress.  HENT:  Head: Atraumatic.  Eyes: Conjunctivae are normal.  Neck: Neck supple.  Cardiovascular: Intact distal pulses and normal pulses. An irregularly irregular rhythm present. Exam reveals no gallop.  No murmur heard. Pulmonary/Chest: Effort normal and breath sounds normal. She has no decreased breath sounds.  Abdominal: She exhibits distension. There is tenderness (Tenderness to suprapubic region and epigastrium on palpation without rebound or guarding.).  Neurological: She is alert.  Skin: No rash noted.  Psychiatric: She has a normal mood and affect.  Nursing note and vitals reviewed.    ED Treatments / Results  Labs (all labs ordered are listed, but only abnormal results are displayed) Labs Reviewed  BASIC METABOLIC PANEL - Abnormal; Notable for the following components:      Result Value   Glucose, Bld 181 (*)    BUN 23 (*)    GFR calc non Af Amer 56 (*)    All other components within normal limits  URINALYSIS, ROUTINE W REFLEX MICROSCOPIC - Abnormal; Notable for the following components:   APPearance HAZY (*)    Nitrite POSITIVE (*)    Leukocytes, UA LARGE (*)    Bacteria, UA RARE (*)    All other components within normal limits  HEPATIC FUNCTION PANEL - Abnormal; Notable for the following components:   Bilirubin, Direct <0.1 (*)    All other components within normal limits  LIPASE, BLOOD - Abnormal; Notable for the following components:   Lipase 65 (*)    All other components within normal limits    CBC  I-STAT TROPONIN, ED    EKG  EKG Interpretation  Date/Time:  Sunday April 29 2017 04:01:58 EDT Ventricular Rate:  65 PR Interval:    QRS Duration: 112 QT Interval:  438 QTC Calculation: 456 R Axis:   -50 Text Interpretation:  Sinus rhythm Atrial premature complex Prolonged PR interval Left anterior fascicular block now sinus Nonspecific T wave abnormality Confirmed by Ezequiel Essex [phone]) on 04/29/2017 4:23:51 AM       Radiology Dg Chest 2 View  Result Date: 04/29/2017 CLINICAL DATA:  Chest pain and shortness of breath. EXAM: CHEST - 2 VIEW COMPARISON:  Most recent radiograph 04/23/2017 FINDINGS: The cardiomediastinal contours are normal. Mild left basilar atelectasis or scarring. Pulmonary vasculature is normal. No consolidation, pleural effusion, or pneumothorax. No acute osseous abnormalities are seen. IMPRESSION: No acute finding. Electronically Signed   By: Jeb Levering M.D.   On: 04/29/2017 04:31    Procedures Procedures (including critical care time)  Medications Ordered in ED Medications - No data to display   Initial Impression / Assessment and Plan / ED Course  I have reviewed the triage vital signs and the nursing notes.  Pertinent labs & imaging results that were available during my care of the patient were reviewed by me and considered in my medical decision making (see chart for details).     BP 130/84   Pulse 69   Temp (!) 97.4 F (36.3 C) (Rectal)   Resp 18   Ht 5\' 3"  (1.6 m)   Wt 71.7 kg (158 lb)   SpO2 100%   BMI 27.99 kg/m    Final Clinical Impressions(s) / ED Diagnoses   Final diagnoses:  None    ED Discharge Orders  None     4:29 AM Patient complaining of dysuria, abdominal pain and epigastric pain.  She mentioned chest pain but the pain seems to be more epigastric.  Does have history of atrial fibrillation and currently rate controlled.  She will need a urinalysis, and likely an abdominal and pelvic CT scan for further  evaluation.  5:21 AM Normal renal function, will obtain abd/pelvis CT. she will also need a delta troponin.  Care discussed with Dr. Wyvonnia Dusky.   6:07 AM UA with findings consistent with a urinary tract infection.  Patient will be treated with Rocephin.  She was still benefit from an abdominal and pelvis CT scan as well as delta troponin.  Patient signed out to oncoming provider.   Domenic Moras, PA-C 04/29/17 [id number]    Ezequiel Essex, MD 04/29/17 [phone]

## 2017-04-29 NOTE — ED Notes (Signed)
Pt moved to hallway to receive antibiotics-- pt c/o headache holding onto forehead-- temp 97.7 --  Pt lives alone, states that she doesn't have a ride home-- daughter doesn't drive, and she only has help M-F 9a-5p.

## 2017-04-29 NOTE — ED Provider Notes (Signed)
Pt signed out to me by B Haywood Pao. She is an 84 year old female presents with epigastric abdominal pain and dysuria (despite initial chief complaint of CP/SOB). Lives at home with help from her daughter and aides M-F. Workup so far has been reassuring. She has evidence of a UTI. Lipase is minimally elevated. At shift change CT of abdomen/pelvis is pending as well as delta trop and Rocephin.   8:41 AM Delta trop and CT are unremarkable. She received Rocephin. Will d/c with rx for Keflex. She is awaiting [facility] to pick her up.    Recardo Evangelist, PA-C 04/29/17 1453    Ezequiel Essex, MD 04/29/17 2356

## 2017-04-29 NOTE — ED Triage Notes (Signed)
Patient BIB [facility]. Patient brought from home c/o chest pain and ShoB. Chest pain started noon yesterday and got progressively worse. ShoB started prior to arrival. EMS administered 324 ASA and 2 SL nitro tabs with improvements to chest pain. Pain rated at 6/10 on 1-10 scale upon arrival in ED

## 2017-04-29 NOTE — Discharge Instructions (Addendum)
Please take Keflex twice a day for 5 days for UTI Follow up with your doctor Return if worsening

## 2017-04-30 ENCOUNTER — Other Ambulatory Visit: Payer: Self-pay | Admitting: *Deleted

## 2017-04-30 ENCOUNTER — Ambulatory Visit (INDEPENDENT_AMBULATORY_CARE_PROVIDER_SITE_OTHER): Payer: Medicare Other | Admitting: Pharmacotherapy

## 2017-04-30 ENCOUNTER — Telehealth: Payer: Self-pay | Admitting: *Deleted

## 2017-04-30 ENCOUNTER — Encounter: Payer: Self-pay | Admitting: Pharmacotherapy

## 2017-04-30 ENCOUNTER — Telehealth: Payer: Self-pay

## 2017-04-30 VITALS — BP 122/76 | HR 76 | Temp 97.6°F | Resp 16 | Ht 63.0 in | Wt 154.6 lb

## 2017-04-30 DIAGNOSIS — E119 Type 2 diabetes mellitus without complications: Secondary | ICD-10-CM | POA: Diagnosis not present

## 2017-04-30 DIAGNOSIS — I10 Essential (primary) hypertension: Secondary | ICD-10-CM

## 2017-04-30 DIAGNOSIS — E1165 Type 2 diabetes mellitus with hyperglycemia: Secondary | ICD-10-CM | POA: Diagnosis not present

## 2017-04-30 MED ORDER — GLUCOSE BLOOD VI STRP: ORAL_STRIP | 12 refills | Status: DC

## 2017-04-30 MED ORDER — ONETOUCH VERIO FLEX SYSTEM W/DEVICE KIT: 1.0000 | PACK | Freq: Every day | 2 refills | Status: DC

## 2017-04-30 MED ORDER — LINAGLIPTIN 5 MG PO TABS: 5.0000 mg | ORAL_TABLET | Freq: Every day | ORAL | 6 refills | Status: DC

## 2017-04-30 NOTE — Telephone Encounter (Signed)
Received fax from [facility] requesting Diagnosis in order to file with insurance.

## 2017-04-30 NOTE — Progress Notes (Signed)
  Subjective:    Ariana [name] is a 84 y.o.white female who presents for follow-up of Type 2 diabetes mellitus.  Arrived 40 minutes late for appointment today. New diagnosis of T2DM. A1C: 9.1% EGFR: 61m/min  She lives alone, but needs assistance.  Dr RMariea Clontshas had extensive discussions with family about need for care and SNF placement.  Big concern about taking medications.  Concern about hypoglycemia potential with medications.  Due to extensive GI issues, do not feel that metformin is a good choice. Now has a caregiver staying with her 9-5 Mon-Fri. On 04/23/17 went to ED with UTI - was given Rocephin and an RX for Cephalexin.  She is with her daughter and care-giver today. She is not checking her blood glucose meter.  No special diets.  She is trying to avoid sugar. She is eating a lot of sugar and juice. No routine exercise.  To start PT soon. Has blurry vision. Has peripheral edema Has peripheral neuropathy Nocturia each night Has dysuria - being treated for UTI Has urinary incontinence.    Review of Systems A comprehensive review of systems was negative except for: Eyes: positive for blurry vision Cardiovascular: positive for irregular heart beat and lower extremity edema Gastrointestinal: positive for change in bowel habits Genitourinary: positive for dysuria, nocturia and urinary incontinence Neurological: positive for peripheral neuropathy    Objective:    BP 122/76   Pulse 76   Temp 97.6 F (36.4 C) (Oral)   Resp 16   Ht _0  (1.6 m)   Wt 154 lb 9.6 oz (70.1 kg)   SpO2 97%   BMI 27.39 kg/m   General:  alert, cooperative and no distress  Oropharynx: normal findings: lips normal without lesions and gums healthy and abnormal findings: dentition: poor   Eyes:  negative findings: lids and lashes normal and conjunctivae and sclerae normal   Ears:  external ears normal        Lung: clear to auscultation bilaterally  Heart:  regular rate and rhythm      Extremities: edema bilateral lower extremities  Skin: dry     Neuro: mental status, speech normal, alert and oriented x3 and gait and station normal   Lab Review Glucose, Bld (mg/dL)  Date Value  04/29/2017 181 (H)  04/25/2017 160 (H)  04/24/2017 147 (H)   CO2 (mmol/L)  Date Value  04/29/2017 26  04/25/2017 24  04/24/2017 23   BUN (mg/dL)  Date Value  04/29/2017 23 (H)  04/25/2017 16  04/24/2017 15  07/08/2015 22  04/28/2015 13  04/20/2015 13   Creat (mg/dL)  Date Value  03/08/2017 0.82  11/08/2016 1.02 (H)  04/04/2016 0.76   Creatinine, Ser (mg/dL)  Date Value  04/29/2017 0.91  04/25/2017 0.84  04/24/2017 0.83       Assessment:    Diabetes Mellitus type II, under poor control. A1C above goal <7.5% BP at goal <130/80 UTI - currently taking cephalexin.   Plan:    1.  Rx changes: add Tradjenta 517mdaily.  Counseled on risk / benefit.  2.  Counseled on complications of uncontrolled diabetes. 3.  Counseled extensively on meal planning and portion control.  Provided written resources to supplement education. 4.  Counseled on importance of routine exercise.  Encouraged her to continue PT exercises on open days. 5.  Counseled on foot care. 6.  BP at goal <130/80 7.  Finish cephalexin as prescribed.

## 2017-04-30 NOTE — Telephone Encounter (Signed)
Patient daughter dropped off a Rx that patient received in the ER for Alprazolam #10. She voided the Rx and turned in to our office so it would not mess up her contract. Wanted [name] aware.

## 2017-04-30 NOTE — Telephone Encounter (Signed)
I have made the 1st attempt to contact the patient or family member in charge, in order to follow up from recently being discharged from the hospital. Patient's son in law took a message for his wife, who is the caregiver to call back

## 2017-04-30 NOTE — Patient Instructions (Signed)
Start Tradjenta 5mg  every morning.  Keep starch choice to 1 per meal.

## 2017-05-01 ENCOUNTER — Telehealth: Payer: Self-pay | Admitting: *Deleted

## 2017-05-01 NOTE — Telephone Encounter (Signed)
Per verbal from [name]:  Continue with activa shake, and stop all senna, miralax and linzess. Maintain hydratation and if doesn't go away in 3 days and spikes a fever please call the office.

## 2017-05-01 NOTE — Telephone Encounter (Signed)
Spoke with daughter and advised results.  

## 2017-05-01 NOTE — Telephone Encounter (Signed)
Rx placed in shredder

## 2017-05-01 NOTE — Telephone Encounter (Signed)
Deshia, Daughter called and stated that patient was placed on Keflex 500mg  twice daily for UTI at the hospital on 04/29/17 and released home. Patient now has watery diarrhea, so bad she can't make it to the bathroom. Has not tried anything OTC for the diarrhea. Patient is drinking Activa shake. Daughter stated they are in a bind because both of her mother's toilets are out and they are waiting on a plumber. Please Advise.

## 2017-05-02 ENCOUNTER — Telehealth: Payer: Self-pay | Admitting: *Deleted

## 2017-05-02 ENCOUNTER — Telehealth: Payer: Self-pay

## 2017-05-02 NOTE — Telephone Encounter (Signed)
Transition Care Management Follow-Up Telephone Call   Date discharged and where:[facility] on 04/29/2017  How have you been since you were released from the hospital?  Still c/o aches and pain and still having dysuria from uti. Patient started antibiotics yesterday   Any patient concerns?  Pt had bad stomach cramps and diarrhea yesterday after taking the antibiotic for uti. [name] addressed this yesterday. She is feeling better today and has not had any new accidents   Items Reviewed:   Meds: Y. Pt daughter needs help with knowing how to check blood sugars and isn't able to be there to check them throughout the day for trajenta  Allergies: Y  Dietary Changes Reviewed: Y  Functional Questionnaire:  Independent-I Dependent-D  ADLs:   Dressing- I w/ assist    Eating-I   Maintaining continence- D   Transferring- I w/ assist of Rolator or cane   Transportation-D   Meal Prep-D   Managing Meds- D  Confirmed importance and Date/Time of follow-up visits scheduled: 05/07/2017 Dr.Reed @ 3pm   Confirmed with patient if condition worsens to call PCP or go to the Emergency Dept. Patient was given office number and encouraged to call back with questions or concerns: Yes

## 2017-05-02 NOTE — Telephone Encounter (Signed)
I have made the 2nd attempt to contact the patient or family member in charge, in order to follow up from recently being discharged from the hospital. Pt son in law answered phone and stated his wife will call back around lunch time

## 2017-05-02 NOTE — Telephone Encounter (Signed)
Ariana [name] with [facility] called requesting Verbal orders for PT 2X6wks.  Verbal orders given.

## 2017-05-04 ENCOUNTER — Telehealth: Payer: Self-pay

## 2017-05-04 NOTE — Telephone Encounter (Signed)
[place] with [facility] called requesting verbal orders for Ariana [name] to have occupational therapy 2 x weekly x 6 weeks   Last OV with Dr.Reed 04/23/17   Per [facility] standing order, verbal order given. Message will be sent to patient's provider as a FYI.

## 2017-05-07 ENCOUNTER — Encounter: Payer: Self-pay | Admitting: Internal Medicine

## 2017-05-07 ENCOUNTER — Ambulatory Visit (INDEPENDENT_AMBULATORY_CARE_PROVIDER_SITE_OTHER): Payer: Medicare Other | Admitting: Internal Medicine

## 2017-05-07 VITALS — BP 128/70 | HR 70 | Temp 98.2°F | Ht 63.0 in | Wt 154.0 lb

## 2017-05-07 DIAGNOSIS — I480 Paroxysmal atrial fibrillation: Secondary | ICD-10-CM

## 2017-05-07 DIAGNOSIS — M545 Low back pain, unspecified: Secondary | ICD-10-CM

## 2017-05-07 DIAGNOSIS — G6289 Other specified polyneuropathies: Secondary | ICD-10-CM

## 2017-05-07 DIAGNOSIS — F0150 Vascular dementia without behavioral disturbance: Secondary | ICD-10-CM

## 2017-05-07 DIAGNOSIS — M3500 Sicca syndrome, unspecified: Secondary | ICD-10-CM | POA: Diagnosis not present

## 2017-05-07 DIAGNOSIS — E119 Type 2 diabetes mellitus without complications: Secondary | ICD-10-CM | POA: Diagnosis not present

## 2017-05-07 MED ORDER — PREGABALIN 100 MG PO CAPS: 100.0000 mg | ORAL_CAPSULE | Freq: Two times a day (BID) | ORAL | 3 refills | Status: DC

## 2017-05-07 NOTE — Progress Notes (Signed)
Location:  [facility] clinic Provider: Tiffany L. Mariea Clonts, D.O., C.M.D.  Code Status: Full Code  Goals of Care:  Advanced Directives 05/07/2017  Does Patient Have a Medical Advance Directive? Yes  Type of Advance Directive [place]  Does patient want to make changes to medical advance directive? No - Patient declined  Copy of Price in Chart? Yes  Would patient like information on creating a medical advance directive? -  Pre-existing out of facility DNR order (yellow form or pink MOST form) -     Chief Complaint  Patient presents with  . [facility] follow-up 04/23/17-04/25/17 (Afib), patient states she had a bad experience in the ER. Patient was told to pick a facility location and the social worker had patient choose between [facility] and patient's daughter refused. Patient c/o lip concerns. Here with Daughter   . Advance Care Planning    HPOA on file   . Medication Management    Discuss need for ASA and discuss cymbalta   . Health Maintenance    Bone density, mammogram, eye exam, MALB, and foot exam due    HPI: Patient is a 84 y.o. female seen today for hospital follow-up s/p admission from she is accompanied with daughter today in office. Both are upset about the transition of her medications from being in the hospital.  Medications: She was specifically ordered by her psychiatrist to have 30 mg in the morning and 60 mg was to be taken in the afternoon. She was changed to being on 90 mg daily all at once. They also stopped her Vicodin and she is having a hard time with this as well. She was taking this for chronic pain of back, neuropathy, arthritis, fibromyalgia, and DDD. They also changed her Lyrica to as needed.   Additionally, she is very distressed about her current care giver not giving her the proper care and cleaning her home and taking her blood pressure as the cardiologist ordered. This has been an on going  issue over the last month or so. She is fixated on the cleaning of her house. She continuously refers back to this during the conversation.  Daughter states they she is not happy with the suggestion of her mom need to go to the nursing home. Her mom has not had a fall since being admitted. She does have dementia, and requires help with medications. She also has had several falls in the past. Daughter at this time does not feel she is in need of SNF.   Past Medical History:  Diagnosis Date  . Abnormality of gait   . Adenomatous polyp of colon 2002   6m  . Allergic rhinitis   . Anxiety   . Anxiety and depression   . Chronic back pain   . Dementia without behavioral disturbance   . Depression   . Diverticulosis of colon   . Dry eye syndrome   . Dysphagia   . Dysthymic disorder   . Fibromyalgia   . GERD (gastroesophageal reflux disease)   . H/O hiatal hernia   . History of adverse drug reaction   . History of cerebrovascular disease 09/24/2014  . History of recurrent UTIs   . Hypertension, benign   . Irritable bowel syndrome   . Low back pain syndrome   . Memory loss   . Mitral valve prolapse   . Paroxysmal A-fib ([place]   . Peripheral neuropathy    "  both feet and legs"  . Physical deconditioning   . Sjogren's syndrome ([place])   . Therapeutic opioid-induced constipation (OIC)   . Thyroid nodule   . Urinary incontinence     Past Surgical History:  Procedure Laterality Date  . ABDOMINAL HYSTERECTOMY  1967  . APPENDECTOMY    . CARDIAC CATHETERIZATION  02/17/2003   normal L main, LAD free of disease, Cfx free of disease, RCA free of disease (Dr. Loni Muse. [place])  . CATARACT EXTRACTION, BILATERAL    . CHOLECYSTECTOMY  2000  . COLONOSCOPY W/ BIOPSIES     multiple  . DENTAL SURGERY     multiple tooth extractions  . ESOPHAGOGASTRODUODENOSCOPY (EGD) WITH ESOPHAGEAL DILATION N/A 08/23/2012   Procedure: ESOPHAGOGASTRODUODENOSCOPY (EGD) WITH ESOPHAGEAL DILATION;  Surgeon: Milus Banister, MD;   Location: [facility];  Service: Endoscopy;  Laterality: N/A;  . NASAL SEPTUM SURGERY  1980  . NM MYOCAR PERF WALL MOTION  2003   persantine - normal static and dynamic study w/apical thinning and presvered LV function, no ischemia  . SINUS EXPLORATION     ossifiying fibroma  . TEMPOROMANDIBULAR JOINT SURGERY  1986   Dr. Terence Lux  . TRANSTHORACIC ECHOCARDIOGRAM  2001   mild LVH, normal LV    Allergies  Allergen Reactions  . Banana Nausea And Vomiting  . Codeine Nausea Only    unless given with Phenergan  . Klonopin [Clonazepam] Other (See Comments)    Causes hallucination   . Meperidine Hcl Nausea Only    unless given with Phenergan  . Norflex [Orphenadrine Citrate] Nausea Only    Unless given with Phenergan  . Oxycodone-Acetaminophen Nausea Only    unless given with phenergan  . Propoxyphene Hcl Nausea Only    unless given with phenergan  . Zoloft [Sertraline Hcl] Other (See Comments)    Caused lethargy  . Doxycycline Other (See Comments)    Unknown reaction  . Naproxen Other (See Comments)    Unknown reaction  . Penicillins Other (See Comments)    Unknown reaction Has patient had a PCN reaction causing immediate rash, facial/tongue/throat swelling, SOB or lightheadedness with hypotension: Unknown Has patient had a PCN reaction causing severe rash involving mucus membranes or skin necrosis: Unknown Has patient had a PCN reaction that required hospitalization: pt was in the hospital at time of reaction Has patient had a PCN reaction occurring within the last 10 years: Unknown If all of the above answers are "NO", then may proceed with Cephalos  . Phenothiazines Other (See Comments)    Unknown reaction  . Stelazine Other (See Comments)    Unknown reaction  . Sulfamethoxazole-Trimethoprim Other (See Comments)    Unknown reaction  . Tolectin [Tolmetin Sodium] Other (See Comments)    Unknown reaction  . Tramadol Other (See Comments)    Unknown reaction     Outpatient Encounter Medications as of 05/07/2017  Medication Sig  . acetaminophen (TYLENOL) 325 MG tablet Take 650 mg by mouth daily as needed for headache (pain).  Marland Kitchen ALPRAZolam (XANAX) 0.25 MG tablet Take 1 tablet (0.25 mg total) by mouth 2 (two) times daily as needed.  Marland Kitchen antiseptic oral rinse (BIOTENE) LIQD 15 mLs by Mouth Rinse route 5 (five) times daily as needed for dry mouth.  Marland Kitchen aspirin 325 MG tablet Take 1 tablet (325 mg total) by mouth daily.  . Blood Glucose Monitoring Suppl ([place]) w/Device KIT 1 Device by Does not apply route daily. Dx:E11.9  . carboxymethylcellulose (REFRESH TEARS) 0.5 % SOLN  Place 1 drop into both eyes 5 (five) times daily as needed (dry eyes).   . CARTIA XT 180 MG 24 hr capsule TAKE ONE CAPSULE BY MOUTH ONCE DAILY  . DULoxetine (CYMBALTA) 30 MG capsule TAKE 1 CAPSULE BY MOUTH ONCE DAILY FOR  BACK  PAIN  AND  DEPRESSION  . DULoxetine (CYMBALTA) 60 MG capsule Take 1 capsule (60 mg total) by mouth daily.  . furosemide (LASIX) 40 MG tablet TAKE 1 TABLET BY MOUTH ONCE DAILY  . glucose blood (ONETOUCH VERIO) test strip Use as instructed twice daily (E11.65)  . hydroxychloroquine (PLAQUENIL) 200 MG tablet Take 265m po qd  . KLOR-CON M20 20 MEQ tablet TAKE 1 TABLET BY MOUTH ONCE DAILY  . lansoprazole (PREVACID) 30 MG capsule TAKE 1 CAPSULE BY MOUTH ONCE DAILY BEFORE BREAKFAST  . linagliptin (TRADJENTA) 5 MG TABS tablet Take 1 tablet (5 mg total) by mouth daily.  .Marland KitchenLINZESS 290 MCG CAPS capsule TAKE 1 CAPSULE BY MOUTH ONCE DAILY BEFORE BREAKFAST  . LYRICA 75 MG capsule TAKE 1 CAPSULE BY MOUTH TWICE DAILY AS NEEDED FOR PAIN  . memantine (NAMENDA) 10 MG tablet TAKE ONE TABLET BY MOUTH TWICE DAILY  . mirabegron ER (MYRBETRIQ) 25 MG TB24 tablet Take 1 tablet (25 mg total) by mouth daily.  . pilocarpine (SALAGEN) 5 MG tablet Take 1 tablet (5 mg total) by mouth 2 (two) times daily.  . polyethylene glycol (MIRALAX / GLYCOLAX) packet Take 17 g by mouth  daily as needed (constipation). Mix in 8 oz liquid and drink  . senna (SENOKOT) 8.6 MG TABS tablet Take 1 tablet (8.6 mg total) by mouth daily.  . sodium chloride (OCEAN) 0.65 % SOLN nasal spray Place 1 spray into the nose as needed for congestion.  . [DISCONTINUED] cephALEXin (KEFLEX) 500 MG capsule Take 1 capsule (500 mg total) by mouth 2 (two) times daily.  . [DISCONTINUED] magic mouthwash w/lidocaine SOLN Take 5 mLs by mouth 4 (four) times daily.   No facility-administered encounter medications on file as of 05/07/2017.     Review of Systems:  Review of Systems  Constitutional: Positive for malaise/fatigue. Negative for chills and fever.  Respiratory: Negative for cough and shortness of breath.   Cardiovascular: Negative for chest pain, palpitations and leg swelling.  Genitourinary: Positive for urgency.       Incontinence-leakage Recent UTI  Musculoskeletal: Positive for back pain, joint pain and myalgias. Negative for falls.  Neurological: Positive for weakness. Negative for dizziness and headaches.  Psychiatric/Behavioral: Positive for memory loss. The patient is nervous/anxious. The patient does not have insomnia.     Health Maintenance  Topic Date Due  . FOOT EXAM  07/04/1939  . OPHTHALMOLOGY EXAM  07/04/1939  . URINE MICROALBUMIN  07/04/1939  . DEXA SCAN  07/04/1996  . MAMMOGRAM  02/03/2017  . HEMOGLOBIN A1C  09/19/2017  . TETANUS/TDAP  12/09/2023  . INFLUENZA VACCINE  Completed  . PNA vac Low Risk Adult  Completed    Physical Exam: Vitals:   05/07/17 1521  BP: 128/70  Pulse: 70  Temp: 98.2 F (36.8 C)  TempSrc: Oral  SpO2: 95%  Weight: 154 lb (69.9 kg)  Height: '5\' 3"'  (1.6 m)   Body mass index is 27.28 kg/m. Physical Exam  Constitutional: She appears well-developed.  Neck: Normal range of motion.  Cardiovascular: Normal rate, regular rhythm, normal heart sounds and intact distal pulses.  Pulmonary/Chest: Effort normal and breath sounds normal.   Abdominal: Soft. Bowel sounds are normal.  Musculoskeletal:  Normal range of motion.  Neurological: She is alert.  Skin: Skin is warm and dry. Capillary refill takes less than 2 seconds.    Labs reviewed: Basic Metabolic Panel: Recent Labs    04/23/17 0001  04/23/17 2027 04/24/17 0251 04/24/17 0814 04/25/17 0636 04/29/17 0410  NA  --    < >  --  138  --  138 140  K  --    < >  --  4.0  --  3.9 3.9  CL  --    < >  --  104  --  105 105  CO2  --    < >  --  23  --  24 26  GLUCOSE  --    < >  --  147*  --  160* 181*  BUN  --    < >  --  15  --  16 23*  CREATININE  --    < >  --  0.83  --  0.84 0.91  CALCIUM  --    < >  --  9.0  --  8.9 9.4  MG  --   --  2.0  --  2.0  --   --   TSH 0.920  --   --   --   --   --   --    < > = values in this interval not displayed.   Liver Function Tests: Recent Labs    03/08/17 1431 04/23/17 1758 04/29/17 0512  AST '28 25 27  ' ALT '28 24 26  ' ALKPHOS  --  81 71  BILITOT 0.3 0.7 0.5  PROT 7.5 6.7 6.6  ALBUMIN  --  3.7 3.6   Recent Labs    04/29/17 0512  LIPASE 65*   No results for input(s): AMMONIA in the last 8760 hours. CBC: Recent Labs    03/08/17 1431 04/23/17 1758 04/24/17 0251 04/25/17 0636 04/29/17 0410  WBC 5.5 7.3 7.2 6.6 6.7  NEUTROABS 3,146 3.7  --  3.6  --   HGB 13.2 13.2 13.2 13.9 12.8  HCT 39.4 41.0 40.4 42.2 39.3  MCV 90.0 93.2 92.4 92.3 92.5  PLT 221 218 205 220 210   Lipid Panel: No results for input(s): CHOL, HDL, LDLCALC, TRIG, CHOLHDL, LDLDIRECT in the last 8760 hours. Lab Results  Component Value Date   HGBA1C 9.1 (H) 03/22/2017    Procedures since last visit: Dg Chest 2 View  Result Date: 04/29/2017 CLINICAL DATA:  Chest pain and shortness of breath. EXAM: CHEST - 2 VIEW COMPARISON:  Most recent radiograph 04/23/2017 FINDINGS: The cardiomediastinal contours are normal. Mild left basilar atelectasis or scarring. Pulmonary vasculature is normal. No consolidation, pleural effusion, or pneumothorax. No  acute osseous abnormalities are seen. IMPRESSION: No acute finding. Electronically Signed   By: Jeb Levering M.D.   On: 04/29/2017 04:31   Ct Abdomen Pelvis W Contrast  Result Date: 04/29/2017 CLINICAL DATA:  Abdominal pain and distension. EXAM: CT ABDOMEN AND PELVIS WITH CONTRAST TECHNIQUE: Multidetector CT imaging of the abdomen and pelvis was performed using the standard protocol following bolus administration of intravenous contrast. CONTRAST:  139m ISOVUE-300 IOPAMIDOL (ISOVUE-300) INJECTION 61% COMPARISON:  CT 5 days prior. FINDINGS: Lower chest: Linear bibasilar atelectasis or scarring. Hepatobiliary: Prominent liver spanning 19.3 cm with hepatic steatosis, small left lobe and caudate. No focal hepatic lesion. Clips in the gallbladder fossa postcholecystectomy. No biliary dilatation. Pancreas: Parenchymal atrophy. No ductal dilatation or inflammation. Spleen: Normal in size  without focal abnormality. Adrenals/Urinary Tract: Normal adrenal glands. No hydronephrosis or perinephric edema. Homogeneous renal enhancement with symmetric excretion on delayed phase imaging. Small left renal cysts are similar to prior exam. Urinary bladder is physiologically distended without wall thickening. Stomach/Bowel: Sigmoid colonic diverticulosis without diverticulitis. No bowel obstruction, inflammation or wall thickening. Appendix not visualized, surgically absent per history. Small hiatal hernia. Vascular/Lymphatic: No acute vascular findings. Minimal aortic atherosclerosis. No enlarged abdominal or pelvic lymph nodes. Reproductive: Status post hysterectomy. No adnexal masses. Other: Small fat containing umbilical hernia. Previous hernia involvement of transverse colon is no longer seen. Fat in the left inguinal canal. No free air, free fluid, or intra-abdominal fluid collection. Musculoskeletal: Degenerative disc disease at L5-S1. There are no acute or suspicious osseous abnormalities. IMPRESSION: 1. No acute  abnormality or change from prior exam. 2. Colonic diverticulosis without diverticulitis. 3. Additional chronic findings are unchanged. Electronically Signed   By: Jeb Levering M.D.   On: 04/29/2017 07:03    Assessment/Plan  1. PAF (paroxysmal atrial fibrillation) ([place]) Recent admission from office with A fib RVR. She is seen today and is doing well from this stand point. She is in rhythm today at visit. She is without dizziness, SOB, or other signs of uncontrolled HR. Still not on anticoagulation due to high fall risk.  2. Vascular dementia without behavioral disturbance She has a private care giver. She also is on Namenda. She is fixated on different topics throughout the conversation and not always appropriate. She is very vocal today with her distressed about the various topics.  3. SJOGREN'S SYNDROME She is followed by Rheumatology. She is on palquenil, but unsure if this is helping her as she still reports dry mouth. She is going to have eye exam soon.   4. Type 2 diabetes mellitus without complication, without long-term current use of insulin ([place]) She is a new onset based off A1c. They have the meter, will make appt with Cathey to learn how to use it. She is not a good candidate for insulin. She is working on diet control.   5. Other polyneuropathy She has on going neuropathy throughout her feet and legs. Will increase her lyrica today to see if we can get this uncontrol.  - pregabalin (LYRICA) 100 MG capsule; Take 1 capsule (100 mg total) by mouth 2 (two) times daily.  Dispense: 60 capsule; Refill: 3  6. Low back pain associated with a spinal disorder other than radiculopathy or spinal stenosis Stop Vicodin post hospitalization. Will increase her Lyrica today. And start Tylenol Arthritis once daily in the morning to help with chronic  Arthritic pain.    - pregabalin (LYRICA) 100 MG capsule; Take 1 capsule (100 mg total) by mouth 2 (two) times daily.  Dispense: 60 capsule;  Refill: 3   Labs/tests ordered: No orders of the defined types were placed in this encounter.   Next appt:  05/14/2017    Karen Kays, RN, AGPCNP, DNP Student  [facility] [phone], [place] [address] Cell Phone (Mon-Fri 8am-5pm):  [phone] On Call:  [phone] & follow prompts after 5pm & weekends Office Phone:  [phone] Office Fax:  [phone]

## 2017-05-07 NOTE — Patient Instructions (Addendum)
Start tylenol arthritis once daily (morning time)   Will adjust cymbalta back to 30/60mg    Will increase lyrica dose to avoid going back on Vicodin  Pick up blood sugar machine at [facility]   Call Dr Prudencio Burly for eye exam secondary to medications and sjogrens  Follow-up with Cathey to get trained on checking blood sugar.

## 2017-05-10 ENCOUNTER — Other Ambulatory Visit: Payer: Self-pay | Admitting: Rheumatology

## 2017-05-10 ENCOUNTER — Other Ambulatory Visit: Payer: Self-pay | Admitting: Internal Medicine

## 2017-05-10 ENCOUNTER — Telehealth: Payer: Self-pay | Admitting: Rheumatology

## 2017-05-10 NOTE — Telephone Encounter (Signed)
-----   Message from Carole Binning, LPN sent at 1/73/5670 12:42 PM EDT ----- Regarding: Please schedule follow up visit.  Please schedule patient a follow up visit. Patient due June 2019. Thanks!

## 2017-05-10 NOTE — Telephone Encounter (Signed)
Last Visit: 03/08/17 Next Visit due in January 2019. Message sent to front to schedule patient.  Okay to refill per Dr. Estanislado Pandy

## 2017-05-10 NOTE — Telephone Encounter (Signed)
I Spoke with her daughter Shanna Un who stated that her mom has been in and out of the hospital and currently has home care.  She will call back to schedule her June appointment when her mom is feeling stronger.

## 2017-05-14 ENCOUNTER — Encounter: Payer: Self-pay | Admitting: Nurse Practitioner

## 2017-05-14 ENCOUNTER — Ambulatory Visit: Payer: Medicare Other | Admitting: Pharmacotherapy

## 2017-05-14 ENCOUNTER — Ambulatory Visit (INDEPENDENT_AMBULATORY_CARE_PROVIDER_SITE_OTHER): Payer: Medicare Other | Admitting: Nurse Practitioner

## 2017-05-14 VITALS — BP 124/72 | HR 73 | Temp 98.7°F | Ht 63.0 in | Wt 154.0 lb

## 2017-05-14 DIAGNOSIS — M545 Low back pain, unspecified: Secondary | ICD-10-CM

## 2017-05-14 DIAGNOSIS — E119 Type 2 diabetes mellitus without complications: Secondary | ICD-10-CM | POA: Diagnosis not present

## 2017-05-14 DIAGNOSIS — N3281 Overactive bladder: Secondary | ICD-10-CM | POA: Diagnosis not present

## 2017-05-14 DIAGNOSIS — R300 Dysuria: Secondary | ICD-10-CM

## 2017-05-14 DIAGNOSIS — J301 Allergic rhinitis due to pollen: Secondary | ICD-10-CM

## 2017-05-14 DIAGNOSIS — Z1231 Encounter for screening mammogram for malignant neoplasm of breast: Secondary | ICD-10-CM | POA: Diagnosis not present

## 2017-05-14 LAB — POCT URINALYSIS DIPSTICK
BILIRUBIN UA: NEGATIVE
Blood, UA: NEGATIVE
GLUCOSE UA: NEGATIVE
KETONES UA: NEGATIVE
Leukocytes, UA: NEGATIVE
Nitrite, UA: NEGATIVE
PH UA: 5 (ref 5.0–8.0)
Protein, UA: NEGATIVE
Spec Grav, UA: 1.025 (ref 1.010–1.025)
UROBILINOGEN UA: NEGATIVE U/dL — AB

## 2017-05-14 LAB — HM MAMMOGRAPHY

## 2017-05-14 MED ORDER — MIRABEGRON ER 25 MG PO TB24: 25.0000 mg | ORAL_TABLET | Freq: Every day | ORAL | 1 refills | Status: DC

## 2017-05-14 MED ORDER — ONETOUCH LANCETS MISC: 11 refills | Status: DC

## 2017-05-14 NOTE — Progress Notes (Signed)
Careteam: Patient Care Team: Gayland Curry, DO as PCP - General (Geriatric Medicine) Noralee Space, MD as Consulting Physician (Pulmonary Disease) Maisie Fus, MD as Consulting Physician (Obstetrics and Gynecology) Debara Pickett Nadean Corwin, MD as Consulting Physician (Cardiology) Monna Fam, MD as Consulting Physician (Ophthalmology) Kathrynn Ducking, MD as Consulting Physician (Neurology) Franchot Gallo, MD as Consulting Physician (Urology) Lavonna Monarch, MD as Consulting Physician (Dermatology) Gatha Mayer, MD as Consulting Physician (Gastroenterology)  Advanced Directive information    Allergies  Allergen Reactions  . Banana Nausea And Vomiting  . Codeine Nausea Only    unless given with Phenergan  . Klonopin [Clonazepam] Other (See Comments)    Causes hallucination   . Meperidine Hcl Nausea Only    unless given with Phenergan  . Norflex [Orphenadrine Citrate] Nausea Only    Unless given with Phenergan  . Oxycodone-Acetaminophen Nausea Only    unless given with phenergan  . Propoxyphene Hcl Nausea Only    unless given with phenergan  . Zoloft [Sertraline Hcl] Other (See Comments)    Caused lethargy  . Doxycycline Other (See Comments)    Unknown reaction  . Naproxen Other (See Comments)    Unknown reaction  . Penicillins Other (See Comments)    Unknown reaction Has patient had a PCN reaction causing immediate rash, facial/tongue/throat swelling, SOB or lightheadedness with hypotension: Unknown Has patient had a PCN reaction causing severe rash involving mucus membranes or skin necrosis: Unknown Has patient had a PCN reaction that required hospitalization: pt was in the hospital at time of reaction Has patient had a PCN reaction occurring within the last 10 years: Unknown If all of the above answers are "NO", then may proceed with Cephalos  . Phenothiazines Other (See Comments)    Unknown reaction  . Stelazine Other (See Comments)    Unknown reaction   . Sulfamethoxazole-Trimethoprim Other (See Comments)    Unknown reaction  . Tolectin [Tolmetin Sodium] Other (See Comments)    Unknown reaction  . Tramadol Other (See Comments)    Unknown reaction    Chief Complaint  Patient presents with  . Acute Visit    Pt is being seen for DM meter teaching.     HPI: Patient is a 84 y.o. female seen in the office today for teaching on diabetic meter.   Pt reports se is having increased pain with urination- recent went to ED for acute cystitis and was placed on keflex but no culture was done. Reports symptoms improved but now they are back. Increase frequency with incontinence.  Also myrbetriq noted as been out.   Having scratchy throat with increase congestion and hoariness -  Reports dry mouth as well. Daughter reports she gets allergies this time of year, daughter would like to know if it is okay to use zyrtec.   Recent A1c 9.1, new diabetes diagnosis. Brought meter today for education on how to use this.   Tylenol dose not last long enough- recommended to take tylenol arthritis since it wasn't lasting enough but the arthritis tylenol did not help at ALL. Is given extra strength tylenol by caregivers before they leave for the day ~5 pm.   Review of Systems:  Review of Systems  Constitutional: Positive for malaise/fatigue. Negative for chills and fever.  HENT: Positive for congestion and sore throat.   Respiratory: Negative for cough and shortness of breath.   Cardiovascular: Negative for chest pain, palpitations and leg swelling.  Genitourinary: Positive for urgency.  Incontinence-leakage Recent UTI  Musculoskeletal: Positive for back pain, joint pain and myalgias. Negative for falls.  Neurological: Negative for dizziness, weakness and headaches.  Endo/Heme/Allergies: Positive for environmental allergies.  Psychiatric/Behavioral: Positive for memory loss. The patient is nervous/anxious. The patient does not have insomnia.      Past Medical History:  Diagnosis Date  . Abnormality of gait   . Adenomatous polyp of colon 2002   78m  . Allergic rhinitis   . Anxiety   . Anxiety and depression   . Chronic back pain   . Dementia without behavioral disturbance   . Depression   . Diverticulosis of colon   . Dry eye syndrome   . Dysphagia   . Dysthymic disorder   . Fibromyalgia   . GERD (gastroesophageal reflux disease)   . H/O hiatal hernia   . History of adverse drug reaction   . History of cerebrovascular disease 09/24/2014  . History of recurrent UTIs   . Hypertension, benign   . Irritable bowel syndrome   . Low back pain syndrome   . Memory loss   . Mitral valve prolapse   . Paroxysmal A-fib ([place]   . Peripheral neuropathy    "both feet and legs"  . Physical deconditioning   . [place]'s syndrome ([place]   . Therapeutic opioid-induced constipation (OIC)   . Thyroid nodule   . Urinary incontinence    Past Surgical History:  Procedure Laterality Date  . ABDOMINAL HYSTERECTOMY  1967  . APPENDECTOMY    . CARDIAC CATHETERIZATION  02/17/2003   normal L main, LAD free of disease, Cfx free of disease, RCA free of disease (Dr. ALoni Muse Little)  . CATARACT EXTRACTION, BILATERAL    . CHOLECYSTECTOMY  2000  . COLONOSCOPY W/ BIOPSIES     multiple  . DENTAL SURGERY     multiple tooth extractions  . ESOPHAGOGASTRODUODENOSCOPY (EGD) WITH ESOPHAGEAL DILATION N/A 08/23/2012   Procedure: ESOPHAGOGASTRODUODENOSCOPY (EGD) WITH ESOPHAGEAL DILATION;  Surgeon: DMilus Banister MD;  Location: [facility];  Service: Endoscopy;  Laterality: N/A;  . NASAL SEPTUM SURGERY  1980  . NM MYOCAR PERF WALL MOTION  2003   persantine - normal static and dynamic study w/apical thinning and presvered LV function, no ischemia  . SINUS EXPLORATION     ossifiying fibroma  . TEMPOROMANDIBULAR JOINT SURGERY  1986   Dr. CTerence Lux . TRANSTHORACIC ECHOCARDIOGRAM  2001   mild LVH, normal LV   Social History:   reports that she has quit  smoking. She has never used smokeless tobacco. She reports that she does not drink alcohol or use drugs.  Family History  Problem Relation Age of Onset  . Heart disease Father        heart attack  . Pneumonia Father   . Heart attack Mother   . Hypertension Mother   . Colon cancer Sister   . Kidney disease Daughter   . Asthma Daughter   . Arthritis Daughter 646      osteo,  . Heart disease Son 530      stage 3 CHF(Diastolic /Systolic)  . Throat cancer Brother   . Hypertension Maternal Grandmother     Medications: Patient's Medications  New Prescriptions   No medications on file  Previous Medications   ACETAMINOPHEN (TYLENOL) 325 MG TABLET    Take 650 mg by mouth daily as needed for headache (pain).   ALPRAZOLAM (XANAX) 0.25 MG TABLET    Take 1 tablet (0.25 mg total) by mouth  2 (two) times daily as needed.   ANTISEPTIC ORAL RINSE (BIOTENE) LIQD    15 mLs by Mouth Rinse route 5 (five) times daily as needed for dry mouth.   ASPIRIN 325 MG TABLET    Take 1 tablet (325 mg total) by mouth daily.   BLOOD GLUCOSE MONITORING SUPPL (ONETOUCH VERIO FLEX SYSTEM) W/DEVICE KIT    1 Device by Does not apply route daily. Dx:E11.9   CARBOXYMETHYLCELLULOSE (REFRESH TEARS) 0.5 % SOLN    Place 1 drop into both eyes 5 (five) times daily as needed (dry eyes).    CARTIA XT 180 MG 24 HR CAPSULE    TAKE ONE CAPSULE BY MOUTH ONCE DAILY   DULOXETINE (CYMBALTA) 30 MG CAPSULE    TAKE 1 CAPSULE BY MOUTH ONCE DAILY FOR  BACK  PAIN  AND  DEPRESSION   DULOXETINE (CYMBALTA) 60 MG CAPSULE    Take 1 capsule (60 mg total) by mouth daily.   FUROSEMIDE (LASIX) 40 MG TABLET    TAKE 1 TABLET BY MOUTH ONCE DAILY   GLUCOSE BLOOD (ONETOUCH VERIO) TEST STRIP    Use as instructed twice daily (E11.65)   HYDROXYCHLOROQUINE (PLAQUENIL) 200 MG TABLET    Take 228m po qd   KLOR-CON M20 20 MEQ TABLET    TAKE 1 TABLET BY MOUTH ONCE DAILY   LANSOPRAZOLE (PREVACID) 30 MG CAPSULE    TAKE 1 CAPSULE BY MOUTH ONCE DAILY BEFORE BREAKFAST     LINAGLIPTIN (TRADJENTA) 5 MG TABS TABLET    Take 1 tablet (5 mg total) by mouth daily.   LINZESS 290 MCG CAPS CAPSULE    TAKE 1 CAPSULE BY MOUTH ONCE DAILY BEFORE BREAKFAST   MEMANTINE (NAMENDA) 10 MG TABLET    TAKE ONE TABLET BY MOUTH TWICE DAILY   MIRABEGRON ER (MYRBETRIQ) 25 MG TB24 TABLET    Take 1 tablet (25 mg total) by mouth daily.   ONE TOUCH LANCETS MISC    by Does not apply route. Use as directed. Dx: E11.9   PILOCARPINE (SALAGEN) 5 MG TABLET    TAKE 1 TABLET BY MOUTH TWICE DAILY   POLYETHYLENE GLYCOL (MIRALAX / GLYCOLAX) PACKET    Take 17 g by mouth daily as needed (constipation). Mix in 8 oz liquid and drink   PREGABALIN (LYRICA) 100 MG CAPSULE    Take 1 capsule (100 mg total) by mouth 2 (two) times daily.   SENNA (SENOKOT) 8.6 MG TABS TABLET    Take 1 tablet (8.6 mg total) by mouth daily.   SODIUM CHLORIDE (OCEAN) 0.65 % SOLN NASAL SPRAY    Place 1 spray into the nose as needed for congestion.  Modified Medications   No medications on file  Discontinued Medications   No medications on file     Physical Exam:  Vitals:   05/14/17 1434  BP: 124/72  Pulse: 73  Temp: 98.7 F (37.1 C)  TempSrc: Oral  SpO2: 99%  Weight: 154 lb (69.9 kg)  Height: '5\' 3"'  (1.6 m)   Body mass index is 27.28 kg/m.  Physical Exam  Constitutional: She appears well-developed and well-nourished.  HENT:  Head: Normocephalic and atraumatic.  Nose: Nose normal.  Mouth/Throat: Oropharynx is clear and moist. No oropharyngeal exudate.  Neck: Normal range of motion.  Cardiovascular: Normal rate, regular rhythm and normal heart sounds.  Pulmonary/Chest: Effort normal and breath sounds normal.  Abdominal: Soft. Bowel sounds are normal. She exhibits no distension. There is tenderness (suprapubic).  Musculoskeletal: Normal range of motion. She exhibits  no edema.  Neurological: She is alert.  Skin: Skin is warm and dry. Capillary refill takes less than 2 seconds.  Psychiatric: Her speech is rapid  and/or pressured. Cognition and memory are impaired.    Labs reviewed: Basic Metabolic Panel: Recent Labs    04/23/17 0001  04/23/17 2027 04/24/17 0251 04/24/17 0814 04/25/17 0636 04/29/17 0410  NA  --    < >  --  138  --  138 140  K  --    < >  --  4.0  --  3.9 3.9  CL  --    < >  --  104  --  105 105  CO2  --    < >  --  23  --  24 26  GLUCOSE  --    < >  --  147*  --  160* 181*  BUN  --    < >  --  15  --  16 23*  CREATININE  --    < >  --  0.83  --  0.84 0.91  CALCIUM  --    < >  --  9.0  --  8.9 9.4  MG  --   --  2.0  --  2.0  --   --   TSH 0.920  --   --   --   --   --   --    < > = values in this interval not displayed.   Liver Function Tests: Recent Labs    03/08/17 1431 04/23/17 1758 04/29/17 0512  AST '28 25 27  ' ALT '28 24 26  ' ALKPHOS  --  81 71  BILITOT 0.3 0.7 0.5  PROT 7.5 6.7 6.6  ALBUMIN  --  3.7 3.6   Recent Labs    04/29/17 0512  LIPASE 65*   No results for input(s): AMMONIA in the last 8760 hours. CBC: Recent Labs    03/08/17 1431 04/23/17 1758 04/24/17 0251 04/25/17 0636 04/29/17 0410  WBC 5.5 7.3 7.2 6.6 6.7  NEUTROABS 3,146 3.7  --  3.6  --   HGB 13.2 13.2 13.2 13.9 12.8  HCT 39.4 41.0 40.4 42.2 39.3  MCV 90.0 93.2 92.4 92.3 92.5  PLT 221 218 205 220 210   Lipid Panel: No results for input(s): CHOL, HDL, LDLCALC, TRIG, CHOLHDL, LDLDIRECT in the last 8760 hours. TSH: Recent Labs    04/23/17 0001  TSH 0.920   A1C: Lab Results  Component Value Date   HGBA1C 9.1 (H) 03/22/2017     Assessment/Plan 1. Overactive bladder -increased frequency since she has been off myrbetriq, refill provided  - mirabegron ER (MYRBETRIQ) 25 MG TB24 tablet; Take 1 tablet (25 mg total) by mouth daily.  Dispense: 90 tablet; Refill: 1  2. Dysuria - POCT urinalysis dipstick- normal, encouraged proper hydration and to restart myrbetriq   3. Type 2 diabetes mellitus without complication, without long-term current use of insulin (HCC) -education  provided on meter -discussed fasting blood sugars should be around 100, low < 80, high <350. To continue tradjenta 5 mg daily  - to continue dietary modifications  -limited time as to when blood sugars will be taken, she was instructed to take twice daily but to take when able based on caregiver/family support  - ONE TOUCH LANCETS MISC; Use as directed. Dx: E11.9  Dispense: 100 each; Refill: 11  4. Seasonal allergic rhinitis due to pollen -okay to start zyrtec 10 mg daily  5. Low back pain associated with a spinal disorder other than radiculopathy or spinal stenosis Okay to use extra strength tylenol 500 mg by mouth every 8 hours as needed for pain, max of 3000 mg in 24 hours.   Next appt: 06/11/2017 Carlos American. [place], [facility] [phone]

## 2017-05-14 NOTE — Patient Instructions (Signed)
Okay to use zyrtec 10 mg daily  Tylenol 500 mg 2 tablets every 8 hours as needed for pain- max of 3000 mg in 24 hours   Blood sugar- NOT fasting should be less than 300, if blood sugars are staying 350-400 to notify Fasting blood sugars should be around 100 ideally

## 2017-05-15 ENCOUNTER — Other Ambulatory Visit: Payer: Self-pay | Admitting: *Deleted

## 2017-05-15 DIAGNOSIS — E119 Type 2 diabetes mellitus without complications: Secondary | ICD-10-CM | POA: Diagnosis not present

## 2017-05-15 MED ORDER — ONETOUCH LANCETS MISC: 11 refills | Status: DC

## 2017-05-15 NOTE — Telephone Encounter (Signed)
[facility] 

## 2017-05-21 ENCOUNTER — Other Ambulatory Visit: Payer: Self-pay | Admitting: Internal Medicine

## 2017-05-22 ENCOUNTER — Telehealth: Payer: Self-pay | Admitting: Internal Medicine

## 2017-05-22 NOTE — Telephone Encounter (Signed)
Please advise Sir, thank you. 

## 2017-05-23 NOTE — Telephone Encounter (Signed)
Spoke with Clement Sayres the daughter and she said Mom is still having problems with constipation. I informed her of what Dr Carlean Purl advised and she verbalized understanding.  They have a Linzess rx ready for pick up now that they will go and get.

## 2017-05-23 NOTE — Telephone Encounter (Signed)
It all depends upon how well bowels are moving.  If doing ok without it then no but if remains constipated then I suspect it would help.  We are treating symptoms so ultimately depends upon is she moving bowels frequently and easily enough to feel ok

## 2017-05-25 ENCOUNTER — Telehealth: Payer: Self-pay

## 2017-05-25 NOTE — Telephone Encounter (Signed)
Spoke with daughter and advised results, she will try and get some help.

## 2017-05-25 NOTE — Telephone Encounter (Signed)
Patient's daughter called to say that she is not sure that patient is able to check her blood sugars properly. She stated that patient is not changing the lancets and she is worried that patient will get an infections. She is wondering what can be done to ensure that patient is doing it properly. I informed her that patient or caregiver should make sure that lancets were changed but the office could not force patient to do it properly.

## 2017-05-25 NOTE — Telephone Encounter (Signed)
Yes, as discussed at visits, patient really does need a family member or caregiver who is permitted to check sugars.  Ideally, she should be in a SNF if this cannot be arranged in the home environment.

## 2017-05-30 ENCOUNTER — Other Ambulatory Visit: Payer: Self-pay | Admitting: Rheumatology

## 2017-05-30 ENCOUNTER — Other Ambulatory Visit: Payer: Self-pay | Admitting: Internal Medicine

## 2017-05-30 NOTE — Telephone Encounter (Signed)
Last Visit: 03/13/17 Next Visit: due June 2019.  Labs: 04/23/17 stable PLQ Eye Exam: 09/27/16 WNL  Okay to refill per Dr. Estanislado Pandy

## 2017-05-31 ENCOUNTER — Telehealth: Payer: Self-pay

## 2017-05-31 NOTE — Telephone Encounter (Signed)
Incoming fax received from [employer] to initiate a PA for [employer].  PA initiated through covermymeds  Key: [id number]  Note from Covermymeds: Your information has been submitted to [place]. [employer] will review the request and fax you a determination directly, typically within 3 business days of your submission once all necessary information is received.  Awaiting response form insurance company

## 2017-06-04 ENCOUNTER — Telehealth: Payer: Self-pay | Admitting: *Deleted

## 2017-06-04 NOTE — Telephone Encounter (Signed)
[place] with [facility] called requesting verbal order for OT 2X1wk Verbal order given.

## 2017-06-05 ENCOUNTER — Telehealth: Payer: Self-pay

## 2017-06-05 NOTE — Telephone Encounter (Signed)
Received fax from [place] for [facility] was DENIED. Placed Denial in Dr. Cyndi Lennert folder to review.

## 2017-06-05 NOTE — Telephone Encounter (Signed)
Cherice with [facility] called to request verbal orders to get an extension for therpay  2 times weekly for 2 weeks  Per [facility] standing order, verbal order given. Message will be sent to patient's provider as a FYI.

## 2017-06-11 ENCOUNTER — Encounter: Payer: Self-pay | Admitting: Internal Medicine

## 2017-06-11 ENCOUNTER — Ambulatory Visit (INDEPENDENT_AMBULATORY_CARE_PROVIDER_SITE_OTHER): Payer: Medicare Other | Admitting: Internal Medicine

## 2017-06-11 VITALS — BP 130/70 | HR 76 | Temp 97.6°F | Ht 63.0 in | Wt 146.0 lb

## 2017-06-11 DIAGNOSIS — F0150 Vascular dementia without behavioral disturbance: Secondary | ICD-10-CM | POA: Diagnosis not present

## 2017-06-11 DIAGNOSIS — L853 Xerosis cutis: Secondary | ICD-10-CM

## 2017-06-11 DIAGNOSIS — K5909 Other constipation: Secondary | ICD-10-CM

## 2017-06-11 DIAGNOSIS — E119 Type 2 diabetes mellitus without complications: Secondary | ICD-10-CM

## 2017-06-11 LAB — GLUCOSE, POCT (MANUAL RESULT ENTRY): POC Glucose: 97 mg/dl (ref 70–99)

## 2017-06-11 MED ORDER — SITAGLIPTIN PHOSPHATE 100 MG PO TABS: 100.0000 mg | ORAL_TABLET | Freq: Every day | ORAL | 3 refills | Status: DC

## 2017-06-11 NOTE — Progress Notes (Signed)
Location:  [facility] clinic Provider:  Zoria Rawlinson L. Mariea Clonts, D.O., C.M.D.  Code Status: full code Goals of Care:  Advanced Directives 06/11/2017  Does Patient Have a Medical Advance Directive? Yes  Type of Advance Directive [place]  Does patient want to make changes to medical advance directive? No - Patient declined  Copy of [place] in Chart? Yes  Would patient like information on creating a medical advance directive? -  Pre-existing out of facility DNR order (yellow form or pink MOST form) -     Chief Complaint  Patient presents with  . Medical Management of Chronic Issues    follow-up on pain, discuss blood sugar meds, rash on left arm    HPI: Patient is a 84 y.o. female seen today for medical management of chronic diseases.    CBG here today:  97.  Pt trying to put strip in wrong end of machine and using wrong end of strip.  Pt's dementia too advanced to check her own sugar.  I've suggested more than once that her daughter go over to the house to help, but she does not have her own transportation and does not go over there to stay.  Pt needs SNF placement, but family has not been supportive of working to arrange this.  Our attempts to arrange this through [facility] have not succeeded and she also has not been admitted to SNF and stayed after hospitalization.  She lives alone and has only part-time caregivers.  She has not been on medicine since tradjenta is not covered and then [place] was recommended.  Reviewing kidney function, it's actually been better recently with GFR in the upper 50s.   Rash on arm:  Has picked several spots raw above her elbow and wrist (left).  None of right arm, legs, etc.  She is sitting picking at some actinic keratoses on her.    Talks about some chronic RLQ pain and talks about the color of her stool varying.    Past Medical History:  Diagnosis Date  . Abnormality of gait   . Adenomatous polyp of colon 2002   40m  .  Allergic rhinitis   . Anxiety   . Anxiety and depression   . Chronic back pain   . Dementia without behavioral disturbance   . Depression   . Diverticulosis of colon   . Dry eye syndrome   . Dysphagia   . Dysthymic disorder   . Fibromyalgia   . GERD (gastroesophageal reflux disease)   . H/O hiatal hernia   . History of adverse drug reaction   . History of cerebrovascular disease 09/24/2014  . History of recurrent UTIs   . Hypertension, benign   . Irritable bowel syndrome   . Low back pain syndrome   . Memory loss   . Mitral valve prolapse   . Paroxysmal A-fib ([place]   . Peripheral neuropathy    "both feet and legs"  . Physical deconditioning   . Sjogren's syndrome ([place]   . Therapeutic opioid-induced constipation (OIC)   . Thyroid nodule   . Urinary incontinence     Past Surgical History:  Procedure Laterality Date  . ABDOMINAL HYSTERECTOMY  1967  . APPENDECTOMY    . CARDIAC CATHETERIZATION  02/17/2003   normal L main, LAD free of disease, Cfx free of disease, RCA free of disease (Dr. ALoni Muse Little)  . CATARACT EXTRACTION, BILATERAL    . CHOLECYSTECTOMY  2000  . COLONOSCOPY W/ BIOPSIES  multiple  . DENTAL SURGERY     multiple tooth extractions  . ESOPHAGOGASTRODUODENOSCOPY (EGD) WITH ESOPHAGEAL DILATION N/A 08/23/2012   Procedure: ESOPHAGOGASTRODUODENOSCOPY (EGD) WITH ESOPHAGEAL DILATION;  Surgeon: Milus Banister, MD;  Location: [facility];  Service: Endoscopy;  Laterality: N/A;  . NASAL SEPTUM SURGERY  1980  . NM MYOCAR PERF WALL MOTION  2003   persantine - normal static and dynamic study w/apical thinning and presvered LV function, no ischemia  . SINUS EXPLORATION     ossifiying fibroma  . TEMPOROMANDIBULAR JOINT SURGERY  1986   Dr. Terence Lux  . TRANSTHORACIC ECHOCARDIOGRAM  2001   mild LVH, normal LV    Allergies  Allergen Reactions  . Banana Nausea And Vomiting  . Codeine Nausea Only    unless given with Phenergan  . Klonopin [Clonazepam] Other (See  Comments)    Causes hallucination   . Meperidine Hcl Nausea Only    unless given with Phenergan  . Norflex [Orphenadrine Citrate] Nausea Only    Unless given with Phenergan  . Oxycodone-Acetaminophen Nausea Only    unless given with phenergan  . Propoxyphene Hcl Nausea Only    unless given with phenergan  . Zoloft [Sertraline Hcl] Other (See Comments)    Caused lethargy  . Doxycycline Other (See Comments)    Unknown reaction  . Naproxen Other (See Comments)    Unknown reaction  . Penicillins Other (See Comments)    Unknown reaction Has patient had a PCN reaction causing immediate rash, facial/tongue/throat swelling, SOB or lightheadedness with hypotension: Unknown Has patient had a PCN reaction causing severe rash involving mucus membranes or skin necrosis: Unknown Has patient had a PCN reaction that required hospitalization: pt was in the hospital at time of reaction Has patient had a PCN reaction occurring within the last 10 years: Unknown If all of the above answers are "NO", then may proceed with Cephalos  . Phenothiazines Other (See Comments)    Unknown reaction  . Stelazine Other (See Comments)    Unknown reaction  . Sulfamethoxazole-Trimethoprim Other (See Comments)    Unknown reaction  . Tolectin [Tolmetin Sodium] Other (See Comments)    Unknown reaction  . Tramadol Other (See Comments)    Unknown reaction    Outpatient Encounter Medications as of 06/11/2017  Medication Sig  . acetaminophen (TYLENOL) 325 MG tablet Take 650 mg by mouth daily as needed for headache (pain).  Marland Kitchen ALPRAZolam (XANAX) 0.25 MG tablet TAKE 1 TABLET BY MOUTH TWICE DAILY AS NEEDED  . antiseptic oral rinse (BIOTENE) LIQD 15 mLs by Mouth Rinse route 5 (five) times daily as needed for dry mouth.  Marland Kitchen aspirin 325 MG tablet Take 1 tablet (325 mg total) by mouth daily.  . carboxymethylcellulose (REFRESH TEARS) 0.5 % SOLN Place 1 drop into both eyes 5 (five) times daily as needed (dry eyes).   . CARTIA XT  180 MG 24 hr capsule TAKE ONE CAPSULE BY MOUTH ONCE DAILY  . DULoxetine (CYMBALTA) 30 MG capsule TAKE 1 CAPSULE BY MOUTH ONCE DAILY FOR  BACK  PAIN  AND  DEPRESSION  . DULoxetine (CYMBALTA) 60 MG capsule Take 1 capsule (60 mg total) by mouth daily.  . furosemide (LASIX) 40 MG tablet TAKE 1 TABLET BY MOUTH ONCE DAILY  . glucose blood (ONETOUCH VERIO) test strip Use as instructed twice daily (E11.65)  . hydroxychloroquine (PLAQUENIL) 200 MG tablet TAKE 1 TABLET BY MOUTH ONCE DAILY  . KLOR-CON M20 20 MEQ tablet TAKE 1 TABLET BY MOUTH ONCE DAILY  .  lansoprazole (PREVACID) 30 MG capsule TAKE 1 CAPSULE BY MOUTH ONCE DAILY BEFORE BREAKFAST  . linagliptin (TRADJENTA) 5 MG TABS tablet Take 1 tablet (5 mg total) by mouth daily.  Marland Kitchen LINZESS 290 MCG CAPS capsule TAKE 1 CAPSULE BY MOUTH AT BEDTIME  . memantine (NAMENDA) 10 MG tablet TAKE 1 TABLET BY MOUTH TWICE DAILY  . mirabegron ER (MYRBETRIQ) 25 MG TB24 tablet Take 1 tablet (25 mg total) by mouth daily.  . ONE TOUCH LANCETS MISC Use to test blood sugar twice daily.  Dx: E11.9  . pilocarpine (SALAGEN) 5 MG tablet TAKE 1 TABLET BY MOUTH TWICE DAILY  . polyethylene glycol (MIRALAX / GLYCOLAX) packet Take 17 g by mouth daily as needed (constipation). Mix in 8 oz liquid and drink  . pregabalin (LYRICA) 100 MG capsule Take 1 capsule (100 mg total) by mouth 2 (two) times daily.  Marland Kitchen senna (SENOKOT) 8.6 MG TABS tablet Take 1 tablet (8.6 mg total) by mouth daily.  . sodium chloride (OCEAN) 0.65 % SOLN nasal spray Place 1 spray into the nose as needed for congestion.  . [DISCONTINUED] Blood Glucose Monitoring Suppl ([place]) w/Device KIT 1 Device by Does not apply route daily. Dx:E11.9  . [DISCONTINUED] magic mouthwash w/lidocaine SOLN Take 5 mLs by mouth 4 (four) times daily.   No facility-administered encounter medications on file as of 06/11/2017.     Review of Systems:  Review of Systems  Constitutional: Negative for chills and fever.    HENT: Negative for congestion.   Eyes: Negative for blurred vision.  Respiratory: Negative for cough and shortness of breath.   Cardiovascular: Negative for chest pain, palpitations and leg swelling.  Gastrointestinal: Positive for abdominal pain and constipation. Negative for blood in stool, diarrhea and melena.  Genitourinary: Negative for dysuria.  Musculoskeletal: Positive for falls.  Skin: Positive for itching and rash.       Left arm  Neurological: Negative for dizziness and loss of consciousness.  Psychiatric/Behavioral: Positive for depression and memory loss. The patient is nervous/anxious and has insomnia.     Health Maintenance  Topic Date Due  . FOOT EXAM  07/04/1939  . OPHTHALMOLOGY EXAM  07/04/1939  . URINE MICROALBUMIN  07/04/1939  . DEXA SCAN  07/04/1996  . MAMMOGRAM  02/03/2017  . INFLUENZA VACCINE  09/13/2017  . HEMOGLOBIN A1C  09/19/2017  . TETANUS/TDAP  12/09/2023  . PNA vac Low Risk Adult  Completed    Physical Exam: Vitals:   06/11/17 1347  BP: 130/70  Pulse: 76  Temp: 97.6 F (36.4 C)  TempSrc: Oral  SpO2: 96%  Weight: 146 lb (66.2 kg)  Height: _0  (1.6 m)   Body mass index is 25.86 kg/m. Physical Exam  Constitutional: She is oriented to person, place, and time. She appears well-developed. No distress.  HENT:  Head: Normocephalic and atraumatic.  Cardiovascular: Intact distal pulses.  irreg irreg  Pulmonary/Chest: Effort normal and breath sounds normal.  Abdominal: Soft. Bowel sounds are normal.  Musculoskeletal: Normal range of motion.  Neurological: She is alert and oriented to person, place, and time.  But poor historian, retells stories of the past  Skin: Skin is warm and dry. There is pallor.    Labs reviewed: Basic Metabolic Panel: Recent Labs    04/23/17 0001  04/23/17 2027 04/24/17 0251 04/24/17 0814 04/25/17 0636 04/29/17 0410  NA  --    < >  --  138  --  138 140  K  --    < >  --  4.0  --  3.9 3.9  CL  --    < >   --  104  --  105 105  CO2  --    < >  --  23  --  24 26  GLUCOSE  --    < >  --  147*  --  160* 181*  BUN  --    < >  --  15  --  16 23*  CREATININE  --    < >  --  0.83  --  0.84 0.91  CALCIUM  --    < >  --  9.0  --  8.9 9.4  MG  --   --  2.0  --  2.0  --   --   TSH 0.920  --   --   --   --   --   --    < > = values in this interval not displayed.   Liver Function Tests: Recent Labs    03/08/17 1431 04/23/17 1758 04/29/17 0512  AST _0 ALT _1 ALKPHOS  --  81 71  BILITOT 0.3 0.7 0.5  PROT 7.5 6.7 6.6  ALBUMIN  --  3.7 3.6   Recent Labs    04/29/17 0512  LIPASE 65*   No results for input(s): AMMONIA in the last 8760 hours. CBC: Recent Labs    03/08/17 1431 04/23/17 1758 04/24/17 0251 04/25/17 0636 04/29/17 0410  WBC 5.5 7.3 7.2 6.6 6.7  NEUTROABS 3,146 3.7  --  3.6  --   HGB 13.2 13.2 13.2 13.9 12.8  HCT 39.4 41.0 40.4 42.2 39.3  MCV 90.0 93.2 92.4 92.3 92.5  PLT 221 218 205 220 210   Lipid Panel: No results for input(s): CHOL, HDL, LDLCALC, TRIG, CHOLHDL, LDLDIRECT in the last 8760 hours. Lab Results  Component Value Date   HGBA1C 9.1 (H) 03/22/2017    Assessment/Plan 1. Type 2 diabetes mellitus without complication, without long-term current use of insulin ([place]) - tradjenta was not on formulary, but Celesta Gentile is so will rx it - sitaGLIPtin (JANUVIA) 100 MG tablet; Take 1 tablet (100 mg total) by mouth daily.  Dispense: 30 tablet; Refill: 3 - POC Glucose (CBG) was 97 which is great  2. Xerosis of skin -suspect cause of "rash" on left arm--appears she's picked off some of her actinic keratoses -advised to use sarna antiitch lotion to moisturize and monitor  3. Vascular dementia without behavioral disturbance -advanced enough that she cannot check her own sugars -ideally needs family member to check for her or to live in a SNF where it can be done for her -[facility] aids cannot do this  4. Chronic constipation -cont current bowel regimen,  must hydrate, eat fiber, stool softener and use miralax when no bms after 2 days  Labs/tests ordered:   Orders Placed This Encounter  Procedures  . POC Glucose (CBG)   Next appt:  11/05/2017 med mgt, has lab appt in July so keep it  Lagina Reader L. Dameir Gentzler, D.O. [facility] [address], [place] [address] Cell Phone (Mon-Fri 8am-5pm):  [phone] On Call:  [phone] & follow prompts after 5pm & weekends Office Phone:  [phone] Office Fax:  [phone]

## 2017-06-11 NOTE — Patient Instructions (Addendum)
Fill the [place] prescription at [facility].  This should keep your sugar under control.  Use sarna cream to help the itching on your left arm.  If you develop increased redness, swelling and pain in the left arm, call me back.  You may be developing cellulitis from the open skin areas.

## 2017-06-14 ENCOUNTER — Other Ambulatory Visit: Payer: Self-pay | Admitting: Internal Medicine

## 2017-06-14 ENCOUNTER — Telehealth: Payer: Self-pay | Admitting: *Deleted

## 2017-06-14 ENCOUNTER — Encounter (HOSPITAL_COMMUNITY): Payer: Self-pay | Admitting: Family Medicine

## 2017-06-14 ENCOUNTER — Ambulatory Visit (HOSPITAL_COMMUNITY)
Admission: EM | Admit: 2017-06-14 | Discharge: 2017-06-14 | Disposition: A | Discharge status: Home / Self Care | Payer: Medicare Other | Attending: Family Medicine | Admitting: Family Medicine

## 2017-06-14 DIAGNOSIS — K2100 Gastro-esophageal reflux disease with esophagitis, without bleeding: Secondary | ICD-10-CM

## 2017-06-14 DIAGNOSIS — K625 Hemorrhage of anus and rectum: Secondary | ICD-10-CM

## 2017-06-14 NOTE — ED Provider Notes (Signed)
[place]    CSN: [id number] Arrival date & time: 06/14/17  1549     History   Chief Complaint No chief complaint on file.   HPI Ariana [name] is a 84 y.o. female. presents with rectal bleeding. Described as bright re; no pain.  Hx chronic constipation.  On multiple meds, including Linzess  HPI  Past Medical History:  Diagnosis Date  . Abnormality of gait   . Adenomatous polyp of colon 2002   54mm  . Allergic rhinitis   . Anxiety   . Anxiety and depression   . Chronic back pain   . Dementia without behavioral disturbance   . Depression   . Diverticulosis of colon   . Dry eye syndrome   . Dysphagia   . Dysthymic disorder   . Fibromyalgia   . GERD (gastroesophageal reflux disease)   . H/O hiatal hernia   . History of adverse drug reaction   . History of cerebrovascular disease 09/24/2014  . History of recurrent UTIs   . Hypertension, benign   . Irritable bowel syndrome   . Low back pain syndrome   . Memory loss   . Mitral valve prolapse   . Paroxysmal A-fib ([place])   . Peripheral neuropathy    "both feet and legs"  . Physical deconditioning   . Sjogren's syndrome ([place])   . Therapeutic opioid-induced constipation (OIC)   . Thyroid nodule   . Urinary incontinence     Patient Active Problem List   Diagnosis Date Noted  . A-fib ([place]) 04/23/2017  . Diabetes mellitus type 2 in nonobese ([place]) 04/23/2017  . Sjogren's disease ([place]) 04/03/2016  . Primary osteoarthritis of both hands 04/03/2016  . High risk medication use 04/03/2016  . Senile dementia, with behavioral disturbance 08/16/2015  . Swelling 08/16/2015  . Diastolic dysfunction 32/99/2426  . Hypertonicity, bladder 07/08/2015  . Arterial hypotension   . Bradycardia 06/29/2015  . Chronic lower back pain 06/29/2015  . PAF (paroxysmal atrial fibrillation) ([place]) 04/18/2015  . Dehydration 04/18/2015  . Dementia without behavioral disturbance 04/18/2015  . TIA (transient ischemic attack)  12/17/2014  . Chest pain 12/16/2014  . Acute encephalopathy 12/04/2014  . Fall   . AKI (acute kidney injury) ([place]) 12/03/2014  . History of cerebrovascular disease 09/24/2014  . Falls frequently 07/28/2014  . Infarction of parietal lobe ([place])   . Mild dementia   . CVA (cerebral infarction) 07/26/2014  . Atrial fibrillation with RVR ([place]) 07/03/2014  . Constipation 04/15/2014  . Mixed stress and urge urinary incontinence 11/03/2013  . Therapeutic opioid induced constipation 11/03/2013  . Hemorrhoid 11/03/2013  . Low back pain associated with a spinal disorder other than radiculopathy or spinal stenosis 11/03/2013  . Protein-calorie malnutrition, severe ([place]) 07/22/2013  . UTI (lower urinary tract infection) 07/20/2013  . Prolonged QT interval 07/20/2013  . Osteopenia 07/17/2013  . Palpitations 06/30/2013  . Hereditary and idiopathic peripheral neuropathy 05/22/2013  . Abnormality of gait 12/05/2012  . Headache(784.0) 09/05/2012  . Multinodular thyroid 01/15/2012  . Neck pain 01/15/2012  . Hypercholesterolemia 07/08/2010  . Tear film insufficiency 07/06/2009  . [place] SYNDROME 07/06/2009  . Urge urinary incontinence 01/06/2009  . COLONIC POLYPS, ADENOMATOUS, HX OF 04/15/2008  . MITRAL VALVE PROLAPSE 11/05/2007  . ANXIETY DEPRESSION 07/02/2007  . Mononeuritis 07/02/2007  . HYPERTENSION, BENIGN 07/02/2007  . GERD 07/02/2007  . Irritable bowel syndrome 07/02/2007  . Fibromyalgia 07/02/2007    Past Surgical History:  Procedure Laterality Date  . ABDOMINAL HYSTERECTOMY  1967  . APPENDECTOMY    . CARDIAC CATHETERIZATION  02/17/2003   normal L main, LAD free of disease, Cfx free of disease, RCA free of disease (Dr. Loni Muse. Little)  . CATARACT EXTRACTION, BILATERAL    . CHOLECYSTECTOMY  2000  . COLONOSCOPY W/ BIOPSIES     multiple  . DENTAL SURGERY     multiple tooth extractions  . ESOPHAGOGASTRODUODENOSCOPY (EGD) WITH ESOPHAGEAL DILATION N/A 08/23/2012   Procedure:  ESOPHAGOGASTRODUODENOSCOPY (EGD) WITH ESOPHAGEAL DILATION;  Surgeon: Milus Banister, MD;  Location: [facility];  Service: Endoscopy;  Laterality: N/A;  . NASAL SEPTUM SURGERY  1980  . NM MYOCAR PERF WALL MOTION  2003   persantine - normal static and dynamic study w/apical thinning and presvered LV function, no ischemia  . SINUS EXPLORATION     ossifiying fibroma  . TEMPOROMANDIBULAR JOINT SURGERY  1986   Dr. Terence Lux  . TRANSTHORACIC ECHOCARDIOGRAM  2001   mild LVH, normal LV    OB History   None      Home Medications    Prior to Admission medications   Medication Sig Start Date End Date Taking? Authorizing Provider  acetaminophen (TYLENOL) 325 MG tablet Take 650 mg by mouth daily as needed for headache (pain).    [provider]  ALPRAZolam Duanne Moron) 0.25 MG tablet TAKE 1 TABLET BY MOUTH TWICE DAILY AS NEEDED 05/21/17   Reed, Tiffany L, DO  antiseptic oral rinse (BIOTENE) LIQD 15 mLs by Mouth Rinse route 5 (five) times daily as needed for dry mouth.    [provider]  aspirin 325 MG tablet Take 1 tablet (325 mg total) by mouth daily. 07/28/14   Reyne Dumas, MD  carboxymethylcellulose (REFRESH TEARS) 0.5 % SOLN Place 1 drop into both eyes 5 (five) times daily as needed (dry eyes).     [provider]  CARTIA XT 180 MG 24 hr capsule TAKE ONE CAPSULE BY MOUTH ONCE DAILY 01/15/17   Hilty, Nadean Corwin, MD  DULoxetine (CYMBALTA) 30 MG capsule TAKE 1 CAPSULE BY MOUTH ONCE DAILY FOR  BACK  PAIN  AND  DEPRESSION 02/02/17   Reed, Tiffany L, DO  DULoxetine (CYMBALTA) 60 MG capsule Take 1 capsule (60 mg total) by mouth daily. 03/29/17   Reed, Tiffany L, DO  furosemide (LASIX) 40 MG tablet TAKE 1 TABLET BY MOUTH ONCE DAILY 05/10/17   Reed, Tiffany L, DO  glucose blood (ONETOUCH VERIO) test strip Use as instructed twice daily (E11.65) 04/30/17   Tivis Ringer, RPH-CPP  hydroxychloroquine (PLAQUENIL) 200 MG tablet TAKE 1 TABLET BY MOUTH ONCE DAILY 05/30/17   Bo Merino, MD  KLOR-CON M20 20 MEQ tablet TAKE 1 TABLET BY MOUTH ONCE DAILY 03/30/17   Reed, Tiffany L, DO  lansoprazole (PREVACID) 30 MG capsule TAKE 1 CAPSULE BY MOUTH ONCE DAILY BEFORE BREAKFAST 06/14/17   Reed, Tiffany L, DO  LINZESS 290 MCG CAPS capsule TAKE 1 CAPSULE BY MOUTH AT BEDTIME 05/21/17   Gatha Mayer, MD  memantine (NAMENDA) 10 MG tablet TAKE 1 TABLET BY MOUTH TWICE DAILY 05/30/17   Reed, Tiffany L, DO  mirabegron ER (MYRBETRIQ) 25 MG TB24 tablet Take 1 tablet (25 mg total) by mouth daily. 05/14/17   Lauree Chandler, NP  ONE TOUCH LANCETS MISC Use to test blood sugar twice daily.  Dx: E11.9 05/15/17   Reed, Tiffany L, DO  pilocarpine (SALAGEN) 5 MG tablet TAKE 1 TABLET BY MOUTH TWICE DAILY 05/10/17   Bo Merino, MD  polyethylene glycol (  MIRALAX / GLYCOLAX) packet Take 17 g by mouth daily as needed (constipation). Mix in 8 oz liquid and drink    [provider]  pregabalin (LYRICA) 100 MG capsule Take 1 capsule (100 mg total) by mouth 2 (two) times daily. 05/07/17   Reed, Tiffany L, DO  senna (SENOKOT) 8.6 MG TABS tablet Take 1 tablet (8.6 mg total) by mouth daily. 03/29/17   Reed, Tiffany L, DO  sitaGLIPtin (JANUVIA) 100 MG tablet Take 1 tablet (100 mg total) by mouth daily. 06/11/17   Reed, Tiffany L, DO  sodium chloride (OCEAN) 0.65 % SOLN nasal spray Place 1 spray into the nose as needed for congestion. 08/23/12   Hongalgi, Lenis Dickinson, MD  magic mouthwash w/lidocaine SOLN Take 5 mLs by mouth 4 (four) times daily.  09/06/15  [provider]    Family History Family History  Problem Relation Age of Onset  . Heart disease Father        heart attack  . Pneumonia Father   . Heart attack Mother   . Hypertension Mother   . Colon cancer Sister   . Kidney disease Daughter   . Asthma Daughter   . Arthritis Daughter 79       osteo,  . Heart disease Son 13       stage 3 CHF(Diastolic /Systolic)  . Throat cancer Brother   . Hypertension Maternal Grandmother      Social History Social History   Tobacco Use  . Smoking status: Former Research scientist (life sciences)  . Smokeless tobacco: Never Used  . Tobacco comment: Quit at age 54   Substance Use Topics  . Alcohol use: No  . Drug use: No     Allergies   Banana; Codeine; Klonopin [clonazepam]; Meperidine hcl; Norflex [orphenadrine citrate]; Oxycodone-acetaminophen; Propoxyphene hcl; Zoloft [sertraline hcl]; Doxycycline; Naproxen; Penicillins; Phenothiazines; Stelazine; Sulfamethoxazole-trimethoprim; Tolectin [tolmetin sodium]; and Tramadol   Review of Systems Review of Systems  Constitutional: Negative.   HENT: Negative.   Respiratory: Negative.   Cardiovascular: Negative.   Gastrointestinal: Positive for anal bleeding, blood in stool and constipation.  Genitourinary: Negative.      Physical Exam Triage Vital Signs ED Triage Vitals  Enc Vitals Group     BP      Pulse      Resp      Temp      Temp src      SpO2      Weight      Height      Head Circumference      Peak Flow      Pain Score      Pain Loc      Pain Edu?      Excl. in [place]?    No data found.  Updated Vital Signs There were no vitals taken for this visit.  Visual Acuity Right Eye Distance:   Left Eye Distance:   Bilateral Distance:    Right Eye Near:   Left Eye Near:    Bilateral Near:     Physical Exam  Constitutional: She appears well-developed and well-nourished.  Abdominal:  Rectal exam; no gross hemorrhoid Anoscopic exam does not demonstrate source of bleeding  Psychiatric: She has a normal mood and affect.     UC Treatments / Results  Labs (all labs ordered are listed, but only abnormal results are displayed) Labs Reviewed - No data to display  EKG None  Radiology No results found.  Procedures Procedures (including critical care time)  Medications  Ordered in UC Medications - No data to display  Initial Impression / Assessment and Plan / UC Course  I have reviewed the triage vital signs and the  nursing notes.  Pertinent labs & imaging results that were available during my care of the patient were reviewed by me and considered in my medical decision making (see chart for details).     Rectal bleeding uncertain etiol.ogy. Hx diverticular disease. No LLQ pain Continue same meds and F/U with PCP Final Clinical Impressions(s) / UC Diagnoses   Final diagnoses:  None   Discharge Instructions   None    ED Prescriptions    None     Controlled Substance Prescriptions [place] Controlled Substance Registry consulted? No   Wardell Honour, MD 06/14/17 [phone]

## 2017-06-14 NOTE — Telephone Encounter (Signed)
Patient daughter Tationa called and stated that patient was just seen on 06/11/17. Stated that patient is now having bleeding when she goes to the bathroom. Stated that she had a BM and had bright red blood in the toilet and on the seat. Wonders if this is coming from a Hemorrhoid. Started this morning.   Chrae spoke with [name] and [name] instructed for patient to go to the ER to be evaluated.   Daughter notified.

## 2017-06-14 NOTE — ED Triage Notes (Signed)
Pt here for rectal bleeding. Reports that she has had 2 BM with straining and constipation. Bright red blood. No abd pain. Some pain with BM.

## 2017-06-16 ENCOUNTER — Other Ambulatory Visit: Payer: Self-pay | Admitting: Internal Medicine

## 2017-07-17 NOTE — Progress Notes (Signed)
Office Visit Note  Patient: Ariana [name]             Date of Birth: August 27, 1929           MRN: [id number]             PCP: Gayland Curry, DO Referring: Gayland Curry, DO Visit Date: 07/31/2017 Occupation: @GUAROCC @    Subjective:  Bilateral knee pain  History of Present Illness: Ariana [name] is a 84 y.o. female with history of Sjogren's, osteoarthritis, and DDD.  She takes Plaquenil 200 mg 1 tablet by mouth daily.  She also takes pilocarpine 5 mg 1 tablet by mouth twice daily as needed.  She reports that her neuropathy in her bilateral lower extremities has been worsening.  She states she has pain especially when she is walking but she is on.  She states that she has seen her neurologist does not have any other recommendations at this time.  She states that she is having discomfort in bilateral knees especially in the popliteal region.  She says she has had physical therapy for the past 4 weeks which is been helping with her knee pain.  She states she has been trying to stay active and walking on a regular basis.  She states that she typically walks with a cane if she is walking for long distances.  She states that she has hammertoes in her bilateral feet but are not causing any discomfort at this time.  She states that she tried wearing metatarsal pads which did not provide any relief.   Activities of Daily Living:  Patient reports morning stiffness for 3-4 hours.   Patient Reports nocturnal pain.  Difficulty dressing/grooming: Denies Difficulty climbing stairs: Reports Difficulty getting out of chair: Denies Difficulty using hands for taps, buttons, cutlery, and/or writing: Denies   Review of Systems  Constitutional: Positive for fatigue.  HENT: Positive for mouth dryness. Negative for mouth sores and nose dryness.   Eyes: Positive for dryness. Negative for pain and visual disturbance.  Respiratory: Negative for cough, hemoptysis, shortness of breath and difficulty breathing.    Cardiovascular: Negative for chest pain, palpitations, hypertension and swelling in legs/feet.  Gastrointestinal: Positive for constipation. Negative for blood in stool and diarrhea.  Endocrine: Negative for increased urination.  Genitourinary: Negative for painful urination.  Musculoskeletal: Positive for arthralgias, joint pain and morning stiffness. Negative for joint swelling, myalgias, muscle weakness, muscle tenderness and myalgias.  Skin: Negative for color change, pallor, rash, hair loss, nodules/bumps, skin tightness, ulcers and sensitivity to sunlight.  Allergic/Immunologic: Negative for susceptible to infections.  Neurological: Negative for dizziness, numbness, headaches and weakness.  Hematological: Negative for swollen glands.  Psychiatric/Behavioral: Negative for depressed mood and sleep disturbance. The patient is not nervous/anxious.     PMFS History:  Patient Active Problem List   Diagnosis Date Noted  . A-fib ([place]) 04/23/2017  . Diabetes mellitus type 2 in nonobese ([place]) 04/23/2017  . Sjogren's disease ([place]) 04/03/2016  . Primary osteoarthritis of both hands 04/03/2016  . High risk medication use 04/03/2016  . Senile dementia, with behavioral disturbance 08/16/2015  . Swelling 08/16/2015  . Diastolic dysfunction 78/67/6720  . Hypertonicity, bladder 07/08/2015  . Arterial hypotension   . Bradycardia 06/29/2015  . Chronic lower back pain 06/29/2015  . PAF (paroxysmal atrial fibrillation) ([place]) 04/18/2015  . Dehydration 04/18/2015  . Dementia without behavioral disturbance 04/18/2015  . TIA (transient ischemic attack) 12/17/2014  . Chest pain 12/16/2014  . Acute encephalopathy 12/04/2014  .  Fall   . AKI (acute kidney injury) ([place]) 12/03/2014  . History of cerebrovascular disease 09/24/2014  . Falls frequently 07/28/2014  . Infarction of parietal lobe ([place])   . Mild dementia   . CVA (cerebral infarction) 07/26/2014  . Atrial fibrillation with RVR ([place])  07/03/2014  . Constipation 04/15/2014  . Mixed stress and urge urinary incontinence 11/03/2013  . Therapeutic opioid induced constipation 11/03/2013  . Hemorrhoid 11/03/2013  . Low back pain associated with a spinal disorder other than radiculopathy or spinal stenosis 11/03/2013  . Protein-calorie malnutrition, severe ([place]) 07/22/2013  . UTI (lower urinary tract infection) 07/20/2013  . Prolonged QT interval 07/20/2013  . Osteopenia 07/17/2013  . Palpitations 06/30/2013  . Hereditary and idiopathic peripheral neuropathy 05/22/2013  . Abnormality of gait 12/05/2012  . Headache(784.0) 09/05/2012  . Multinodular thyroid 01/15/2012  . Neck pain 01/15/2012  . Hypercholesterolemia 07/08/2010  . Tear film insufficiency 07/06/2009  . [place] SYNDROME 07/06/2009  . Urge urinary incontinence 01/06/2009  . COLONIC POLYPS, ADENOMATOUS, HX OF 04/15/2008  . MITRAL VALVE PROLAPSE 11/05/2007  . ANXIETY DEPRESSION 07/02/2007  . Mononeuritis 07/02/2007  . HYPERTENSION, BENIGN 07/02/2007  . GERD 07/02/2007  . Irritable bowel syndrome 07/02/2007  . Fibromyalgia 07/02/2007    Past Medical History:  Diagnosis Date  . Abnormality of gait   . Adenomatous polyp of colon 2002   2mm  . Allergic rhinitis   . Anxiety   . Anxiety and depression   . Chronic back pain   . Dementia without behavioral disturbance   . Depression   . Diverticulosis of colon   . Dry eye syndrome   . Dysphagia   . Dysthymic disorder   . Fibromyalgia   . GERD (gastroesophageal reflux disease)   . H/O hiatal hernia   . History of adverse drug reaction   . History of cerebrovascular disease 09/24/2014  . History of recurrent UTIs   . Hypertension, benign   . Irritable bowel syndrome   . Low back pain syndrome   . Memory loss   . Mitral valve prolapse   . Paroxysmal A-fib ([place])   . Peripheral neuropathy    "both feet and legs"  . Physical deconditioning   . Sjogren's syndrome ([place])   . Therapeutic opioid-induced  constipation ([facility])   . Thyroid nodule   . Urinary incontinence     Family History  Problem Relation Age of Onset  . Heart disease Father        heart attack  . Pneumonia Father   . Heart attack Mother   . Hypertension Mother   . Colon cancer Sister   . Kidney disease Daughter   . Asthma Daughter   . Arthritis Daughter 52       osteo,  . Heart disease Son 6       stage 3 CHF(Diastolic /Systolic)  . Throat cancer Brother   . Hypertension Maternal Grandmother    Past Surgical History:  Procedure Laterality Date  . ABDOMINAL HYSTERECTOMY  1967  . APPENDECTOMY    . CARDIAC CATHETERIZATION  02/17/2003   normal L main, LAD free of disease, Cfx free of disease, RCA free of disease (Dr. Loni Muse. Little)  . CATARACT EXTRACTION, BILATERAL    . CHOLECYSTECTOMY  2000  . COLONOSCOPY W/ BIOPSIES     multiple  . DENTAL SURGERY     multiple tooth extractions  . ESOPHAGOGASTRODUODENOSCOPY (EGD) WITH ESOPHAGEAL DILATION N/A 08/23/2012   Procedure: ESOPHAGOGASTRODUODENOSCOPY (EGD) WITH ESOPHAGEAL DILATION;  Surgeon: Quillian Quince  Merrily Brittle, MD;  Location: [facility];  Service: Endoscopy;  Laterality: N/A;  . NASAL SEPTUM SURGERY  1980  . NM MYOCAR PERF WALL MOTION  2003   persantine - normal static and dynamic study w/apical thinning and presvered LV function, no ischemia  . SINUS EXPLORATION     ossifiying fibroma  . TEMPOROMANDIBULAR JOINT SURGERY  1986   Dr. Terence Lux  . 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    Patient is Widowed.    Patient has 2 children.    Patient is retired.    Former smoker   Alcohol none   Exercise Walk, exercise chair 4 days a week   POA    Walks with cane      Patient drinks about 1-2 cups of hot tea daily.   Patient is right handed.                 Objective: Vital Signs: BP 116/63 (BP Location: Left Arm, Patient Position: Sitting, Cuff  Size: Normal)   Pulse 70   Resp 16   Ht 5' 3.5" (1.613 m)   Wt 152 lb (68.9 kg)   BMI 26.50 kg/m    Physical Exam  Constitutional: She is oriented to person, place, and time. She appears well-developed and well-nourished.  HENT:  Head: Normocephalic and atraumatic.  Eyes: Conjunctivae and EOM are normal.  Neck: Normal range of motion.  Cardiovascular: Normal rate, regular rhythm, normal heart sounds and intact distal pulses.  Pulmonary/Chest: Effort normal and breath sounds normal.  Abdominal: Soft. Bowel sounds are normal.  Lymphadenopathy:    She has no cervical adenopathy.  Neurological: She is alert and oriented to person, place, and time.  Skin: Skin is warm and dry. Capillary refill takes less than 2 seconds.  Psychiatric: She has a normal mood and affect. Her behavior is normal.  Nursing note and vitals reviewed.    Musculoskeletal Exam: C-spine limited range of motion.  Thoracic kyphosis noted.  She is limited range of motion of the lumbar spine.  No midline spinal tenderness.  Shoulder joints, elbow joints, wrist joints, MCPs, PIPs, DIPs good range of motion no synovitis.  She has PIP and DIP synovial thickening consistent with osteoarthritis of bilateral hands.  CMC joint synovial thickening bilaterally.  Hip joints, knee joints, ankle joints, MTPs, PIPs, DIPs good range of motion with no synovitis.  She has hammertoes in bilateral feet.  No warmth or effusion of bilateral knee joints.  She has bilateral knee crepitus.  She has Baker's cyst palpable popliteal region bilaterally.  She has tenderness of bilateral trochanteric bursa.  CDAI Exam: No CDAI exam completed.    Investigation: No additional findings. CBC Latest Ref Rng & Units 04/29/2017 04/25/2017 04/24/2017  WBC 4.0 - 10.5 K/uL 6.7 6.6 7.2  Hemoglobin 12.0 - 15.0 g/dL 12.8 13.9 13.2  Hematocrit 36.0 - 46.0 % 39.3 42.2 40.4  Platelets 150 - 400 K/uL 210 220 205   CMP Latest Ref Rng & Units 04/29/2017 04/25/2017  04/24/2017  Glucose 65 - 99 mg/dL 181(H) 160(H) 147(H)  BUN 6 - 20 mg/dL 23(H) 16 15  Creatinine 0.44 - 1.00 mg/dL 0.91 0.84 0.83  Sodium 135 - 145 mmol/L 140 138 138  Potassium 3.5 - 5.1 mmol/L 3.9 3.9 4.0  Chloride 101 - 111 mmol/L 105 105 104  CO2 22 -  32 mmol/L 26 24 23   Calcium 8.9 - 10.3 mg/dL 9.4 8.9 9.0  Total Protein 6.5 - 8.1 g/dL 6.6 - -  Total Bilirubin 0.3 - 1.2 mg/dL 0.5 - -  Alkaline Phos 38 - 126 U/L 71 - -  AST 15 - 41 U/L 27 - -  ALT 14 - 54 U/L 26 - -     Imaging: No results found.  Speciality Comments: PLQ eye exam: 09/27/2016 Normal. Dr. Prudencio Burly. Follow up in 12 months.    Procedures:  No procedures performed Allergies: Banana; Codeine; Klonopin [clonazepam]; Meperidine hcl; Norflex [orphenadrine citrate]; Oxycodone-acetaminophen; Propoxyphene hcl; Zoloft [sertraline hcl]; Doxycycline; Naproxen; Penicillins; Phenothiazines; Stelazine; Sulfamethoxazole-trimethoprim; Tolectin [tolmetin sodium]; and Tramadol   Assessment / Plan:     Visit Diagnoses: Sjogren's syndrome with other organ involvement ([place]) - ANA+, Ro-, La -She continues to have sicca symptoms.  No parotid swelling was noted.  She continues to see her dentist on a regular basis.  She is no longer wearing dentures.  She uses Biotene products on a regular basis.  She is also taking pilocarpine 5 mg twice daily as needed for sicca symptoms.  She is on Plaquenil 200 mg 1 tablet daily.  She was given a refill of Plaquenil and pilocarpine today in the office.  High risk medication use - PLQ 200 mg by mouth daily.  She was given a refill of Plaquenil today in the office.  She had lab work in March 2019.  We will repeat CBC, CMP, and UA at her next visit.  Primary osteoarthritis of both hands: She has PIP, DIP, and CMP synovial thickening bilaterally.  Joint protection and muscle strengthening were discussed.  She has been having increased joint stiffness in her bilateral hands.   Primary osteoarthritis of both  feet: She has PIP and DIP synovial thickening consistent with osteoarthritis of bilateral feet.  She has hammertoes in several toes.  She tried wearing metatarsal pads which did not provide any relief.  She is not having any joint pain in her feet at this time.  She has bilateral peripheral neuropathy which is been causing worsening pain.  Trochanteric bursitis of right hip: She has tenderness of the right trochanteric bursa.  DDD (degenerative disc disease), lumbar: No tenderness in the lumbar spine.  She is limited range of motion in the lumbar spine.  Hereditary and idiopathic peripheral neuropathy: Her peripheral neuropathy has been worsening.  She is advised to follow-up with her neurologist.  Other medical conditions are listed as follows:  Osteopenia of multiple sites  History of hypertension  History of TIA (transient ischemic attack)  History of gastroesophageal reflux (GERD)  History of hypercholesterolemia  History of insomnia  History of memory loss  History of atrial fibrillation    Orders: No orders of the defined types were placed in this encounter.  Meds ordered this encounter  Medications  . pilocarpine (SALAGEN) 5 MG tablet    Sig: Take 1 tablet (5 mg total) by mouth 2 (two) times daily.    Dispense:  180 tablet    Refill:  1  . hydroxychloroquine (PLAQUENIL) 200 MG tablet    Sig: TAKE 1 TABLET BY MOUTH ONCE DAILY    Dispense:  90 tablet    Refill:  0    Face-to-face time spent with patient was 42minutes. >50% of time was spent in counseling and coordination of care.  Follow-Up Instructions: Return in about 6 months (around 01/30/2018) for Sjogren's syndrome, Osteoarthritis.   Ofilia Neas,  PA-C  Note - This record has been created using [employer].  Chart creation errors have been sought, but may not always  have been located. Such creation errors do not reflect on  the standard of medical care.

## 2017-07-18 ENCOUNTER — Other Ambulatory Visit: Payer: Self-pay | Admitting: Internal Medicine

## 2017-07-18 NOTE — Telephone Encounter (Signed)
[place] Database verified and compliance confirmed   

## 2017-07-31 ENCOUNTER — Encounter: Payer: Self-pay | Admitting: Physician Assistant

## 2017-07-31 ENCOUNTER — Ambulatory Visit (INDEPENDENT_AMBULATORY_CARE_PROVIDER_SITE_OTHER): Payer: Medicare Other | Admitting: Physician Assistant

## 2017-07-31 VITALS — BP 116/63 | HR 70 | Resp 16 | Ht 63.5 in | Wt 152.0 lb

## 2017-07-31 DIAGNOSIS — M5136 Other intervertebral disc degeneration, lumbar region: Secondary | ICD-10-CM | POA: Diagnosis not present

## 2017-07-31 DIAGNOSIS — M19072 Primary osteoarthritis, left ankle and foot: Secondary | ICD-10-CM

## 2017-07-31 DIAGNOSIS — G609 Hereditary and idiopathic neuropathy, unspecified: Secondary | ICD-10-CM | POA: Diagnosis not present

## 2017-07-31 DIAGNOSIS — M7061 Trochanteric bursitis, right hip: Secondary | ICD-10-CM

## 2017-07-31 DIAGNOSIS — Z8719 Personal history of other diseases of the digestive system: Secondary | ICD-10-CM

## 2017-07-31 DIAGNOSIS — M3509 Sicca syndrome with other organ involvement: Secondary | ICD-10-CM

## 2017-07-31 DIAGNOSIS — M19041 Primary osteoarthritis, right hand: Secondary | ICD-10-CM

## 2017-07-31 DIAGNOSIS — Z87898 Personal history of other specified conditions: Secondary | ICD-10-CM

## 2017-07-31 DIAGNOSIS — Z79899 Other long term (current) drug therapy: Secondary | ICD-10-CM | POA: Diagnosis not present

## 2017-07-31 DIAGNOSIS — M19042 Primary osteoarthritis, left hand: Secondary | ICD-10-CM

## 2017-07-31 DIAGNOSIS — M19071 Primary osteoarthritis, right ankle and foot: Secondary | ICD-10-CM | POA: Diagnosis not present

## 2017-07-31 DIAGNOSIS — Z8639 Personal history of other endocrine, nutritional and metabolic disease: Secondary | ICD-10-CM

## 2017-07-31 DIAGNOSIS — Z8673 Personal history of transient ischemic attack (TIA), and cerebral infarction without residual deficits: Secondary | ICD-10-CM

## 2017-07-31 DIAGNOSIS — M8589 Other specified disorders of bone density and structure, multiple sites: Secondary | ICD-10-CM

## 2017-07-31 DIAGNOSIS — Z8679 Personal history of other diseases of the circulatory system: Secondary | ICD-10-CM

## 2017-07-31 MED ORDER — HYDROXYCHLOROQUINE SULFATE 200 MG PO TABS: ORAL_TABLET | ORAL | 0 refills | Status: DC

## 2017-07-31 MED ORDER — PILOCARPINE HCL 5 MG PO TABS
5.0000 mg | ORAL_TABLET | Freq: Two times a day (BID) | ORAL | 1 refills | Status: DC
Start: 2017-07-31 — End: 2018-05-09

## 2017-08-02 ENCOUNTER — Other Ambulatory Visit: Payer: Self-pay | Admitting: *Deleted

## 2017-08-02 DIAGNOSIS — E119 Type 2 diabetes mellitus without complications: Secondary | ICD-10-CM

## 2017-08-02 MED ORDER — SITAGLIPTIN PHOSPHATE 100 MG PO TABS: 100.0000 mg | ORAL_TABLET | Freq: Every day | ORAL | 3 refills | Status: DC

## 2017-08-02 NOTE — Telephone Encounter (Signed)
[facility] 

## 2017-08-06 ENCOUNTER — Other Ambulatory Visit: Payer: Self-pay | Admitting: Internal Medicine

## 2017-08-08 ENCOUNTER — Other Ambulatory Visit: Payer: Self-pay | Admitting: Internal Medicine

## 2017-08-08 DIAGNOSIS — F341 Dysthymic disorder: Secondary | ICD-10-CM

## 2017-08-08 DIAGNOSIS — M797 Fibromyalgia: Secondary | ICD-10-CM

## 2017-08-13 DIAGNOSIS — E1165 Type 2 diabetes mellitus with hyperglycemia: Secondary | ICD-10-CM | POA: Diagnosis not present

## 2017-08-14 ENCOUNTER — Other Ambulatory Visit: Payer: Self-pay | Admitting: Internal Medicine

## 2017-08-17 ENCOUNTER — Other Ambulatory Visit: Payer: Self-pay | Admitting: *Deleted

## 2017-08-17 ENCOUNTER — Other Ambulatory Visit: Payer: Self-pay | Admitting: Internal Medicine

## 2017-08-17 NOTE — Telephone Encounter (Signed)
Opened in error

## 2017-08-21 ENCOUNTER — Telehealth: Payer: Self-pay

## 2017-08-21 NOTE — Telephone Encounter (Signed)
I spoke with patient's daughter and she stated that she would try to get the insurance company to fax over a statement of what they need the letter to say.    She would also like to know if patient can check her blood sugars twice a day Monday through Friday and then not check on the weekends. Patient has someone to help her during the week but no one on the weekends. Is this ok?

## 2017-08-21 NOTE — Telephone Encounter (Signed)
This does not make a lot of sense.  Did they provide documentation that this letter from me is needed?  If so, can they fax, mail or bring it to the office so we can see it and provide the proper documentation?

## 2017-08-21 NOTE — Telephone Encounter (Signed)
Patient's daughter called to ask [name] if she could write a letter to patient's insurance company stating that patient may have insulin injections while hospitalized. The family has ran into an issue regarding the insurance company not paying for the insulin injections that patient received during a hospital stay in March because that patient is not prescribed insulin. The hospital automaticity gave patient the injection to maintain an appropriate glucose level during treatment and the insurance company stated that a letter of approval from PCP is required for them to pay for insulin injection at any time the patient is hospitalized.  The insulin injections cost the patient over $600 and she cannot afford to pay this out of pocket each time she has to go into the hospital.

## 2017-08-21 NOTE — Telephone Encounter (Signed)
I suppose we have no choice about the sugar checks if she is there alone on the weekends. That's why she needs long term care.    I will be on the lookout for the paperwork from the insurance company.

## 2017-08-22 NOTE — Telephone Encounter (Signed)
I spoke with patient's daughter and she verbalized understanding. She stated that she would try to check patient's blood sugars at least once on the weekends if she can.

## 2017-08-24 ENCOUNTER — Other Ambulatory Visit: Payer: Self-pay | Admitting: Internal Medicine

## 2017-08-27 ENCOUNTER — Telehealth: Payer: Self-pay | Admitting: *Deleted

## 2017-08-27 NOTE — Telephone Encounter (Signed)
Discussed response with Ariana [name], patient's daughter. Ariana [name] verbalized understanding. Patient is taking myrbetriq as directed.  Patient's daughter is concerned that the insurance company recommended testing for diabetic kidney disease and we have not mentioned this. I informed [name] that I will inquire about test being added to her pending labs. Dr.Reed please advise if ok to add MALB  Per Ariana [name] patient with normal urination today as of 11:00 am

## 2017-08-27 NOTE — Telephone Encounter (Signed)
Is she using the myrbetriq?  It looks like it was due for a refill at the beginning of the month.  Januvia should not cause this.  If they'd like to bring her for a BMP lab sooner, we can arrange that.

## 2017-08-27 NOTE — Telephone Encounter (Signed)
She is tested regularly for kidney disease almost every time her labs are done.  She has not had a urine microalbumin because there's nothing we can do about it.  Her blood pressure is too low to add an ace inhibitor or angiotensive receptor blocker to protect her kidneys.    I suspect she overused her pads when she was alone on the weekend due to her progressive dementia.

## 2017-08-27 NOTE — Telephone Encounter (Signed)
Patient daughter, Kassity called and stated that patient is having a problem with her kidneys. Stated that in the last 5 weeks she has went through an enormous amount of depends. Stated that this weekend, Saturday and Sunday, patient went through 14 depends. Stated that when patient stands up it just flows. No other symptoms. Daughter is wondering if it is the Januvia causing this. And also wonders about diabetic kidney disease. Patient does have Blood work scheduled for the 25th. Please Advise.

## 2017-08-28 ENCOUNTER — Encounter: Payer: Self-pay | Admitting: *Deleted

## 2017-08-28 NOTE — Telephone Encounter (Signed)
I called [facility] and requested mammogram, awaiting fax. Once fax received report will be abstracted

## 2017-08-28 NOTE — Telephone Encounter (Addendum)
Discussed additional response with [name] (patient's daughter). [name] verbalized understanding. [name] also asked about any other recommendations for her mother. I went through the health maintenance recommendations with Ariana [name] and we undated all overdue recommendations  Patient had mammogram in April 2019, I will call to request report  Patient was told by GYN that she needs BMD every 3 years, not due until 2010 (postponed)  Patient has pending DM eye exam for 10/11/17 (postponed), I informed [name] to have copy of report sent to office  DM foot exam to be performed at next appointment, noted on appointment notes

## 2017-08-30 ENCOUNTER — Other Ambulatory Visit: Payer: Self-pay | Admitting: Internal Medicine

## 2017-08-30 DIAGNOSIS — M545 Low back pain, unspecified: Secondary | ICD-10-CM

## 2017-08-30 DIAGNOSIS — G6289 Other specified polyneuropathies: Secondary | ICD-10-CM

## 2017-09-03 ENCOUNTER — Telehealth: Payer: Self-pay | Admitting: *Deleted

## 2017-09-03 NOTE — Telephone Encounter (Signed)
How many depends get used during the week when she has the help she needs?  Pt is not really safe to be changing her own depends.

## 2017-09-03 NOTE — Telephone Encounter (Signed)
Patient daughter, Eloyce called and stated that the Caregiver was there at the patient's home this morning and patient stood up and the urine just flowed out. Stated that the caregiver counted the depends again this weekend and patient went through 14 depends over the weekend. Stated that the caregiver picked them up to make sure they were used and they were heavy. Daughter is wondering what is going on with her for it to be 2 weekends in a row now. Please Advise.

## 2017-09-03 NOTE — Telephone Encounter (Signed)
Daughter stated that usually 2-3 depends during the day.  Patient has a Fish farm manager in weekly and the nurse told them that it sound like she has a bad UTI.

## 2017-09-03 NOTE — Telephone Encounter (Signed)
A bad uti would have urinary frequency all days of the week, not just each weekend when patient (with dementia) is home alone.

## 2017-09-03 NOTE — Telephone Encounter (Signed)
Spoke with daughter and advised results. Daughter scheduled appt with Dr. Linna Darner 09/04/17.

## 2017-09-04 ENCOUNTER — Encounter: Payer: Self-pay | Admitting: Internal Medicine

## 2017-09-04 ENCOUNTER — Ambulatory Visit (INDEPENDENT_AMBULATORY_CARE_PROVIDER_SITE_OTHER): Payer: Medicare Other | Admitting: Internal Medicine

## 2017-09-04 VITALS — BP 114/68 | HR 71 | Temp 98.4°F | Ht 63.0 in | Wt 150.0 lb

## 2017-09-04 DIAGNOSIS — Z8719 Personal history of other diseases of the digestive system: Secondary | ICD-10-CM | POA: Diagnosis not present

## 2017-09-04 DIAGNOSIS — E0865 Diabetes mellitus due to underlying condition with hyperglycemia: Secondary | ICD-10-CM | POA: Diagnosis not present

## 2017-09-04 DIAGNOSIS — E042 Nontoxic multinodular goiter: Secondary | ICD-10-CM

## 2017-09-04 DIAGNOSIS — IMO0002 Reserved for concepts with insufficient information to code with codable children: Secondary | ICD-10-CM | POA: Insufficient documentation

## 2017-09-04 DIAGNOSIS — R350 Frequency of micturition: Secondary | ICD-10-CM

## 2017-09-04 LAB — POCT UA - GLUCOSE/PROTEIN
Bilirubin, UA: NEGATIVE
Blood, UA: NEGATIVE
GLUCOSE UA: NEGATIVE
Ketones, POC UA: NEGATIVE mg/dL
LEUKOCYTES UA: NEGATIVE
NITRITE UA: NEGATIVE
Protein, UA: NEGATIVE
Urobilinogen, UA: 0.2 E.U./dL
pH, UA: 5 (ref 5.0–8.0)

## 2017-09-04 NOTE — Assessment & Plan Note (Signed)
CBC and differential 

## 2017-09-04 NOTE — Assessment & Plan Note (Signed)
Update A1c ?

## 2017-09-04 NOTE — Assessment & Plan Note (Signed)
Check TSH 

## 2017-09-04 NOTE — Patient Instructions (Addendum)
Urology appointment will be scheduled. Results of your labs will be provided to you. Please increase the Myrbetriq to 50 mg every day (TWO 25 mg pills daily) until seen by Urology

## 2017-09-04 NOTE — Progress Notes (Signed)
09/04/2017 This is a [name] office visit  follow up for specific acute issue of  urinary frequency  Interim medical record and care since last [facility] visit was updated with review of diagnostic studies and change in clinical status since last visit were documented.  [id number] is obtained from phone messages as well as from her caregiver and the patient. Unfortunately the patient  has a diagnosis of dementia. Her caregiver is Elmo Putt Case ([phone]) who is with her 9 am- 5 pm Mon-Fri.  The patient's daughter, Tonjua called stating that the patient is using "an abnormal number of Depends, especially over the weekend". The patient had used 14 Depends over the weekend. The patient is by herself over the weekend but has caregiver supervision during the week as noted. During the week the patient typically will use 1-2 Depends per day. The patient is on furosemide 40 mg daily and Mybetriq  25 mg daily. [name] questioned whether the patient was taking this agent, her daughter verified that she is compliant. The patient's last A1c was in February with a value of 9.1%, suggesting uncontrolled diabetes. Today the patient describes right lower quadrant discomfort and low back pain "for weeks";but she cannot elaborate further. Dip urine was negative for nitrates or leukocytes.  Review of systems: Dementia invalidated responses. Date given as "Tues July??,????" The patient became distracted about a remote fall with dental trauma which required a partial.  She also began talking about thyroid cyst which required prior surgery.  Finally she focused on rectal bleeding she had a approximately one month ago. She was seen in urgent care and diverticulosis questioned as no bleeding hemorrhoids were documented. The patient and her caregiver deny any other bleeding dyscrasias at this time.  According to caregiver glucoses at home are 115-140 with a high fasting of 170.  She denies polyphagia or polydipsia  although she does have a dry mouth related to her Sjogren's syndrome.  Constitutional: No fever, significant weight change, fatigue  Gastrointestinal: No heartburn, dysphagia, abdominal pain, nausea /vomiting,new rectal bleeding, melena, change in bowels Genitourinary: No dysuria, hematuria, pyuria Endocrine: No change in hair/skin/nails Hematologic/lymphatic: No significant bruising, lymphadenopathy, abnormal bleeding  Physical exam:  Pertinent or positive findings: edentulous state. She has an asymmetric goiter with the left lobe larger than right. Abdomen is protuberant. She has slightly decreased posterior tibial pulses.  General appearance: Adequately nourished; no acute distress, increased work of breathing is present.   Lymphatic: No lymphadenopathy about the head, neck, axilla. Eyes: No conjunctival inflammation or lid edema is present. There is no scleral icterus. Ears:  External ear exam shows no significant lesions or deformities.   Nose:  External nasal examination shows no deformity or inflammation. Nasal mucosa are pink and moist without lesions, exudates Oral exam:  Lips and gums are healthy appearing. There is no oropharyngeal erythema or exudate. Neck:  No thyromegaly, masses, tenderness noted.    Heart:  Normal rate and regular rhythm. S1 and S2 normal without gallop, murmur, click, rub .  Lungs: Chest clear to auscultation without wheezes, rhonchi, rales, rubs. Abdomen: Bowel sounds are normal. Abdomen is soft and nontender with no organomegaly, hernias, masses. GU: Deferred  Extremities:  No cyanosis, clubbing, edema  Neurologic exam : Balance, Rhomberg, finger to nose testing could not be completed due to clinical state Skin: Warm & dry w/o tenting. No significant lesions or rash.  See summary under each active problem in the Problem List with associated updated therapeutic plan Total time  32 minutes; greater than 50% of the visit spent counseling patient's care  giver and coordinating care for problems addressed at this encounter

## 2017-09-05 ENCOUNTER — Other Ambulatory Visit: Payer: Self-pay

## 2017-09-05 ENCOUNTER — Encounter: Payer: Self-pay | Admitting: Internal Medicine

## 2017-09-05 DIAGNOSIS — F0150 Vascular dementia without behavioral disturbance: Secondary | ICD-10-CM

## 2017-09-05 LAB — BASIC METABOLIC PANEL WITH GFR
BUN: 20 mg/dL (ref 7–25)
CO2: 29 mmol/L (ref 20–32)
Calcium: 9.7 mg/dL (ref 8.6–10.4)
Chloride: 100 mmol/L (ref 98–110)
Creat: 0.84 mg/dL (ref 0.60–0.88)
GFR, Est African American: 73 mL/min/{1.73_m2} (ref >=60)
GFR, Est Non African American: 63 mL/min/{1.73_m2} (ref >=60)
GLUCOSE: 80 mg/dL (ref 65–139)
Potassium: 4.3 mmol/L (ref 3.5–5.3)
Sodium: 140 mmol/L (ref 135–146)

## 2017-09-05 LAB — CBC WITH DIFFERENTIAL/PLATELET
Basophils Absolute: 29 cells/uL (ref 0–200)
Basophils Relative: 0.5 %
EOS ABS: 151 {cells}/uL (ref 15–500)
Eosinophils Relative: 2.6 %
HEMATOCRIT: 37.8 % (ref 35.0–45.0)
Hemoglobin: 12.6 g/dL (ref 11.7–15.5)
LYMPHS ABS: 1839 {cells}/uL (ref 850–3900)
MCH: 30.8 pg (ref 27.0–33.0)
MCHC: 33.3 g/dL (ref 32.0–36.0)
MCV: 92.4 fL (ref 80.0–100.0)
MPV: 11.4 fL (ref 7.5–12.5)
Monocytes Relative: 10.8 %
NEUTROS ABS: 3155 {cells}/uL (ref 1500–7800)
NEUTROS PCT: 54.4 %
Platelets: 196 10*3/uL (ref 140–400)
RBC: 4.09 10*6/uL (ref 3.80–5.10)
RDW: 12.3 % (ref 11.0–15.0)
Total Lymphocyte: 31.7 %
WBC: 5.8 10*3/uL (ref 3.8–10.8)
WBCMIX: 626 {cells}/uL (ref 200–950)

## 2017-09-05 LAB — HEMOGLOBIN A1C
Hgb A1c MFr Bld: 6.4 % of total Hgb — ABNORMAL HIGH (ref <5.7)
Mean Plasma Glucose: 137 (calc)
eAG (mmol/L): 7.6 (calc)

## 2017-09-05 LAB — TSH: TSH: 1.35 mIU/L (ref 0.40–4.50)

## 2017-09-06 ENCOUNTER — Other Ambulatory Visit: Payer: Medicare Other

## 2017-09-06 DIAGNOSIS — F0150 Vascular dementia without behavioral disturbance: Secondary | ICD-10-CM | POA: Diagnosis not present

## 2017-09-06 LAB — LIPID PANEL
Cholesterol: 183 mg/dL (ref <200)
HDL: 63 mg/dL (ref >50)
LDL Cholesterol (Calc): 99 mg/dL (calc)
Non-HDL Cholesterol (Calc): 120 mg/dL (calc) (ref <130)
Total CHOL/HDL Ratio: 2.9 (calc) (ref <5.0)
Triglycerides: 113 mg/dL (ref <150)

## 2017-09-07 ENCOUNTER — Encounter: Payer: Self-pay | Admitting: *Deleted

## 2017-09-10 ENCOUNTER — Other Ambulatory Visit: Payer: Self-pay | Admitting: Internal Medicine

## 2017-09-10 ENCOUNTER — Other Ambulatory Visit: Payer: Self-pay | Admitting: *Deleted

## 2017-09-10 DIAGNOSIS — Z6826 Body mass index (BMI) 26.0-26.9, adult: Secondary | ICD-10-CM | POA: Diagnosis not present

## 2017-09-10 DIAGNOSIS — N3281 Overactive bladder: Secondary | ICD-10-CM

## 2017-09-10 DIAGNOSIS — K2100 Gastro-esophageal reflux disease with esophagitis, without bleeding: Secondary | ICD-10-CM

## 2017-09-10 DIAGNOSIS — N760 Acute vaginitis: Secondary | ICD-10-CM | POA: Diagnosis not present

## 2017-09-10 DIAGNOSIS — Z01419 Encounter for gynecological examination (general) (routine) without abnormal findings: Secondary | ICD-10-CM | POA: Diagnosis not present

## 2017-09-10 MED ORDER — MIRABEGRON ER 50 MG PO TB24: 50.0000 mg | ORAL_TABLET | Freq: Every day | ORAL | 1 refills | Status: DC

## 2017-09-10 NOTE — Telephone Encounter (Signed)
Patient daughter, Rumor called and stated that patient needs a new Rx for Myrebetriq. Stated that Dr. Linna Darner increased it to 50mg  daily and patient is going to run out tomorrow. Last OV note reviewed. Rx sent to pharmacy.

## 2017-09-20 ENCOUNTER — Other Ambulatory Visit: Payer: Self-pay | Admitting: Internal Medicine

## 2017-09-20 ENCOUNTER — Other Ambulatory Visit: Payer: Self-pay | Admitting: *Deleted

## 2017-09-20 ENCOUNTER — Encounter: Payer: Self-pay | Admitting: Family

## 2017-09-20 ENCOUNTER — Ambulatory Visit (INDEPENDENT_AMBULATORY_CARE_PROVIDER_SITE_OTHER): Payer: Medicare Other | Admitting: Family

## 2017-09-20 ENCOUNTER — Telehealth: Payer: Self-pay | Admitting: *Deleted

## 2017-09-20 VITALS — BP 118/64 | HR 74 | Temp 97.5°F | Resp 18 | Ht 63.0 in | Wt 148.2 lb

## 2017-09-20 DIAGNOSIS — L602 Onychogryphosis: Secondary | ICD-10-CM

## 2017-09-20 DIAGNOSIS — M79674 Pain in right toe(s): Secondary | ICD-10-CM

## 2017-09-20 DIAGNOSIS — M2042 Other hammer toe(s) (acquired), left foot: Secondary | ICD-10-CM | POA: Diagnosis not present

## 2017-09-20 MED ORDER — ALPRAZOLAM 0.25 MG PO TABS: 0.2500 mg | ORAL_TABLET | Freq: Two times a day (BID) | ORAL | 0 refills | Status: DC | PRN

## 2017-09-20 NOTE — Progress Notes (Signed)
Provider: Jatziry Wechter FNP-C  Gayland Curry, DO  Patient Care Team: Gayland Curry, DO as PCP - General (Geriatric Medicine) Noralee Space, MD as Consulting Physician (Pulmonary Disease) Maisie Fus, MD as Consulting Physician (Obstetrics and Gynecology) Debara Pickett Nadean Corwin, MD as Consulting Physician (Cardiology) Monna Fam, MD as Consulting Physician (Ophthalmology) Kathrynn Ducking, MD as Consulting Physician (Neurology) Franchot Gallo, MD as Consulting Physician (Urology) Lavonna Monarch, MD as Consulting Physician (Dermatology) Gatha Mayer, MD as Consulting Physician (Gastroenterology)  Extended Emergency Contact Information Primary Emergency Contact: Era Skeen Address: [address]          [place], [place] [address] Johnnette Litter of [place] Phone: [phone] Mobile Phone: [phone] Relation: Daughter Secondary Emergency Contact: Arvin Collard [place] of [place] Mobile Phone: [phone] Relation: Other   Goals of care: Advanced Directive information Advanced Directives 09/04/2017  Does Patient Have a Medical Advance Directive? Yes  Type of Paramedic of [place];Living will  Does patient want to make changes to medical advance directive? No - Patient declined  Copy of [place] in Chart? No - copy requested  Would patient like information on creating a medical advance directive? -  Pre-existing out of facility DNR order (yellow form or pink MOST form) -     Chief Complaint  Patient presents with  . Acute Visit    ? infected right big toe;     HPI:  Pt is a 84 y.o. female seen today at [facility] office for an acute visit for evaluation of right big toe.she is escorted by her care giver.she states was in the bath tub yesterday and noticed her left toenail was partially coming off so she removed it.she complains of tenderness to toenail area.care giver states daughter saw some  yellow drainage yesterday.care giver states cleansed toe with hydrogen peroxide,applied antibiotic ointment and covered with band aid.she denies any drainage today.Also denies any fever or chills.of note she has a significant medical history of Type 2 DM with peripheral neuropathy.    Past Medical History:  Diagnosis Date  . Abnormality of gait   . Adenomatous polyp of colon 2002   71mm  . Allergic rhinitis   . Anxiety   . Anxiety and depression   . Chronic back pain   . Dementia without behavioral disturbance   . Depression   . Diverticulosis of colon   . Dry eye syndrome   . Dysphagia   . Dysthymic disorder   . Fibromyalgia   . GERD (gastroesophageal reflux disease)   . H/O hiatal hernia   . History of adverse drug reaction   . History of cerebrovascular disease 09/24/2014  . History of recurrent UTIs   . Hypertension, benign   . Irritable bowel syndrome   . Low back pain syndrome   . Memory loss   . Mitral valve prolapse   . Paroxysmal A-fib ([place])   . Peripheral neuropathy    "both feet and legs"  . Physical deconditioning   . Sjogren's syndrome ([place])   . Therapeutic opioid-induced constipation (OIC)   . Thyroid nodule   . Urinary incontinence    Past Surgical History:  Procedure Laterality Date  . ABDOMINAL HYSTERECTOMY  1967  . APPENDECTOMY    . CARDIAC CATHETERIZATION  02/17/2003   normal L main, LAD free of disease, Cfx free of disease, RCA free of disease (Dr. Loni Muse. [place])  . CATARACT EXTRACTION, BILATERAL    . CHOLECYSTECTOMY  2000  .  COLONOSCOPY W/ BIOPSIES     multiple  . DENTAL SURGERY     multiple tooth extractions  . ESOPHAGOGASTRODUODENOSCOPY (EGD) WITH ESOPHAGEAL DILATION N/A 08/23/2012   Procedure: ESOPHAGOGASTRODUODENOSCOPY (EGD) WITH ESOPHAGEAL DILATION;  Surgeon: Milus Banister, MD;  Location: [facility] ENDOSCOPY;  Service: Endoscopy;  Laterality: N/A;  . NASAL SEPTUM SURGERY  1980  . NM MYOCAR PERF WALL MOTION  2003   persantine - normal static and  dynamic study w/apical thinning and presvered LV function, no ischemia  . SINUS EXPLORATION     ossifiying fibroma  . TEMPOROMANDIBULAR JOINT SURGERY  1986   Dr. Terence Lux  . TRANSTHORACIC ECHOCARDIOGRAM  2001   mild LVH, normal LV    Allergies  Allergen Reactions  . Banana Nausea And Vomiting  . Codeine Nausea Only    unless given with Phenergan  . Klonopin [Clonazepam] Other (See Comments)    Causes hallucination   . Meperidine Hcl Nausea Only    unless given with Phenergan  . Norflex [Orphenadrine Citrate] Nausea Only    Unless given with Phenergan  . Oxycodone-Acetaminophen Nausea Only    unless given with phenergan  . Propoxyphene Hcl Nausea Only    unless given with phenergan  . Zoloft [Sertraline Hcl] Other (See Comments)    Caused lethargy  . Doxycycline Other (See Comments)    Unknown reaction  . Naproxen Other (See Comments)    Unknown reaction  . Penicillins Other (See Comments)    Unknown reaction Has patient had a PCN reaction causing immediate rash, facial/tongue/throat swelling, SOB or lightheadedness with hypotension: Unknown Has patient had a PCN reaction causing severe rash involving mucus membranes or skin necrosis: Unknown Has patient had a PCN reaction that required hospitalization: pt was in the hospital at time of reaction Has patient had a PCN reaction occurring within the last 10 years: Unknown If all of the above answers are "NO", then may proceed with Cephalos  . Phenothiazines Other (See Comments)    Unknown reaction  . Stelazine Other (See Comments)    Unknown reaction  . Sulfamethoxazole-Trimethoprim Other (See Comments)    Unknown reaction  . Tolectin [Tolmetin Sodium] Other (See Comments)    Unknown reaction  . Tramadol Other (See Comments)    Unknown reaction    Outpatient Encounter Medications as of 09/20/2017  Medication Sig  . acetaminophen (TYLENOL) 325 MG tablet Take 650 mg by mouth daily as needed for headache (pain).  Marland Kitchen  ALPRAZolam (XANAX) 0.25 MG tablet TAKE 1 TABLET BY MOUTH TWICE DAILY AS NEEDED  . antiseptic oral rinse (BIOTENE) LIQD 15 mLs by Mouth Rinse route 5 (five) times daily as needed for dry mouth.  Marland Kitchen aspirin 325 MG tablet Take 1 tablet (325 mg total) by mouth daily.  . carboxymethylcellulose (REFRESH TEARS) 0.5 % SOLN Place 1 drop into both eyes 5 (five) times daily as needed (dry eyes).   . CARTIA XT 180 MG 24 hr capsule TAKE ONE CAPSULE BY MOUTH ONCE DAILY  . DULoxetine (CYMBALTA) 30 MG capsule TAKE 1 CAPSULE BY MOUTH ONCE DAILY FOR  BACK  PAIN  AND  DEPRESSION  . DULoxetine (CYMBALTA) 60 MG capsule TAKE 1 CAPSULE BY MOUTH ONCE DAILY  . furosemide (LASIX) 40 MG tablet TAKE 1 TABLET BY MOUTH ONCE DAILY  . glucose blood (ONETOUCH VERIO) test strip Use as instructed twice daily (E11.65)  . hydroxychloroquine (PLAQUENIL) 200 MG tablet TAKE 1 TABLET BY MOUTH ONCE DAILY  . lansoprazole (PREVACID) 30 MG capsule TAKE 1  CAPSULE BY MOUTH ONCE DAILY BEFORE BREAKFAST  . LINZESS 290 MCG CAPS capsule TAKE 1 CAPSULE BY MOUTH ONCE DAILY AT BEDTIME  . LYRICA 100 MG capsule TAKE 1 CAPSULE BY MOUTH TWICE DAILY  . memantine (NAMENDA) 10 MG tablet TAKE 1 TABLET BY MOUTH TWICE DAILY  . mirabegron ER (MYRBETRIQ) 50 MG TB24 tablet Take 1 tablet (50 mg total) by mouth daily.  . ONE TOUCH LANCETS MISC Use to test blood sugar twice daily.  Dx: E11.9  . pilocarpine (SALAGEN) 5 MG tablet Take 1 tablet (5 mg total) by mouth 2 (two) times daily.  . polyethylene glycol (MIRALAX / GLYCOLAX) packet Take 17 g by mouth daily as needed (constipation). Mix in 8 oz liquid and drink  . potassium chloride SA (K-DUR,KLOR-CON) 20 MEQ tablet TAKE 1 TABLET BY MOUTH ONCE DAILY  . senna (SENOKOT) 8.6 MG TABS tablet Take 1 tablet (8.6 mg total) by mouth daily.  . sitaGLIPtin (JANUVIA) 100 MG tablet Take 1 tablet (100 mg total) by mouth daily.  . sodium chloride (OCEAN) 0.65 % SOLN nasal spray Place 1 spray into the nose as needed for  congestion.  . [DISCONTINUED] magic mouthwash w/lidocaine SOLN Take 5 mLs by mouth 4 (four) times daily.   No facility-administered encounter medications on file as of 09/20/2017.     Review of Systems  Constitutional: Negative for chills, fatigue and fever.  Respiratory: Negative for cough, chest tightness, shortness of breath and wheezing.   Gastrointestinal: Negative for abdominal distention, abdominal pain, constipation, diarrhea, nausea and vomiting.  Skin: Negative for color change, pallor and rash.       Left great toe per HPI   Neurological:       Hx of peripheral neuropathy   Psychiatric/Behavioral: Negative for agitation, confusion and sleep disturbance. The patient is not nervous/anxious.     Immunization History  Administered Date(s) Administered  . Influenza Split 12/26/2010, 01/15/2012  . Influenza Whole 12/30/2008, 11/15/2009  . Influenza, High Dose Seasonal PF 12/21/2016  . Influenza,inj,Quad PF,6+ Mos 02/04/2013, 11/03/2013, 12/05/2014, 12/13/2015  . Pneumococcal Conjugate-13 06/12/2014  . Pneumococcal Polysaccharide-23 02/15/2009, 12/05/2014  . Tdap 12/08/2013  . Zoster 01/29/2012   Pertinent  Health Maintenance Due  Topic Date Due  . FOOT EXAM  07/04/1939  . INFLUENZA VACCINE  09/13/2017  . OPHTHALMOLOGY EXAM  10/14/2017 (Originally 07/04/1939)  . DEXA SCAN  02/13/2018 (Originally 07/04/1996)  . HEMOGLOBIN A1C  03/07/2018  . MAMMOGRAM  05/15/2018  . PNA vac Low Risk Adult  Completed  . URINE MICROALBUMIN  Discontinued   Fall Risk  09/04/2017 06/11/2017 05/14/2017 04/23/2017 02/15/2017  Falls in the past year? No No No No No  Number falls in past yr: - - - - -  Injury with Fall? - - - - -  Comment - - - - -  Risk Factor Category  - - - - -  Risk for fall due to : - - - - -  Follow up - - - - -   Functional Status Survey:    Vitals:   09/20/17 1358  BP: 118/64  Pulse: 74  Resp: 18  Temp: (!) 97.5 F (36.4 C)  TempSrc: Oral  SpO2: 92%  Weight: 148 lb  3.2 oz (67.2 kg)  Height: 5\' 3"  (1.6 m)   Body mass index is 26.25 kg/m. Physical Exam  Constitutional: She appears well-developed and well-nourished.  HENT:  Head: Normocephalic.  Mouth/Throat: Oropharynx is clear and moist. No oropharyngeal exudate.  Eyes: Pupils are  equal, round, and reactive to light. Conjunctivae and EOM are normal. Right eye exhibits no discharge. Left eye exhibits no discharge. No scleral icterus.  Neck: Normal range of motion. No JVD present. No thyromegaly present.  Cardiovascular: Intact distal pulses. An irregular rhythm present. Exam reveals no gallop and no friction rub.  Pulmonary/Chest: Effort normal and breath sounds normal. No respiratory distress. She has no wheezes. She has no rales.  Abdominal: Soft. Bowel sounds are normal. She exhibits no distension and no mass. There is no tenderness. There is no rebound and no guarding.  Musculoskeletal: She exhibits no edema or tenderness.  Moves x 4 extremities.left hammer 2nd toe  Lymphadenopathy:    She has no cervical adenopathy.  Neurological: She has normal strength.  Decreased sensation to both feet  Skin: Skin is warm and dry. No rash noted. No erythema. No pallor.  1. Left great toenail off previous nail site tender to touch.no drainage noted.surrounding skin tissue without any redness or tenderness.   2.Left 2nd  hammer Toe callus site open without any signs of infections.   Psychiatric: She has a normal mood and affect. Her speech is normal and behavior is normal. Judgment and thought content normal.  Vitals reviewed.   Labs reviewed: Recent Labs    04/23/17 2027  04/24/17 0814 04/25/17 0636 04/29/17 0410 09/04/17 1554  NA  --    < >  --  138 140 140  K  --    < >  --  3.9 3.9 4.3  CL  --    < >  --  105 105 100  CO2  --    < >  --  24 26 29   GLUCOSE  --    < >  --  160* 181* 80  BUN  --    < >  --  16 23* 20  CREATININE  --    < >  --  0.84 0.91 0.84  CALCIUM  --    < >  --  8.9 9.4 9.7   MG 2.0  --  2.0  --   --   --    < > = values in this interval not displayed.   Recent Labs    03/08/17 1431 04/23/17 1758 04/29/17 0512  AST 28 25 27   ALT 28 24 26   ALKPHOS  --  81 71  BILITOT 0.3 0.7 0.5  PROT 7.5 6.7 6.6  ALBUMIN  --  3.7 3.6   Recent Labs    04/23/17 1758  04/25/17 0636 04/29/17 0410 09/04/17 1554  WBC 7.3   < > 6.6 6.7 5.8  NEUTROABS 3.7  --  3.6  --  3,155  HGB 13.2   < > 13.9 12.8 12.6  HCT 41.0   < > 42.2 39.3 37.8  MCV 93.2   < > 92.3 92.5 92.4  PLT 218   < > 220 210 196   < > = values in this interval not displayed.   Lab Results  Component Value Date   TSH 1.35 09/04/2017   Lab Results  Component Value Date   HGBA1C 6.4 (H) 09/04/2017   Lab Results  Component Value Date   CHOL 183 09/06/2017   HDL 63 09/06/2017   LDLCALC 99 09/06/2017   LDLDIRECT 65.7 07/08/2010   TRIG 113 09/06/2017   CHOLHDL 2.9 09/06/2017    Significant Diagnostic Results in last 30 days:  No results found.  Assessment/Plan 1. Overgrown toenails Right toe  nails overgrown.with her diagnosis of Type 2 DM with neuropathy will have see a podiatrist to trim overgrown toenails and evaluate onychomycosis.I've discussed with patient and care giver to avoid trimming own toenails.  2. Painful right Great toenail site  Afebrile.Toenail partially came off 09/19/2017 but removed it per patient.Previous nail bed site tender to touch but without any signs of infections.advised patient not to touch or pick on nail bed to prevent infections.Care giver to soak right foot in epsom salt,pat dry,apply triple antibiotics ointment and cover with band aid.also advised patient to avoid wearing tight fitting shoes.will need diabetic shoes but will defer to podiatrist. notify provider's office for any fever,chills or drainage from toe.   3. Left hammer Toe with callus Afebrile.small open area wound bed red without any drainage or signs of infections.surrounding skin tissue without any  tenderness.notify provider's office for any fever,chills or drainage from toe.  Family/ staff Communication: Reviewed plan of care with patient and care giver.  Labs/tests ordered:   Refer to Podiatrist for left great toe overgrown toenail/onchomycosis    Sandrea Hughs, NP

## 2017-09-20 NOTE — Telephone Encounter (Signed)
[place] Database verified and compliance confirmed   

## 2017-09-20 NOTE — Telephone Encounter (Signed)
Patient daughter, Hisae called and stated that patient's Great Big Toenail Right Foot came off yesterday. Stated that it was alittle painful, some redness and minimum bleeding with yellow oozing. Daughter called Podiatrist and they can see her Friday morning at 8am but patient cannot get up that early and that was the best they could do. Has been putting Hydrogen Peroxide and Neosporin on it. Wants your advise. Please Advise.

## 2017-09-20 NOTE — Telephone Encounter (Signed)
Patient daughter requested refill.  Phoned to pharmacy.  

## 2017-09-20 NOTE — Telephone Encounter (Signed)
Scheduled appointment for today with [place]. Patient's daughter would like for Maudie Mercury Facilities manager) to call her after appointment to review AVS, patient's daughter states the caregiver does not relay information well and her mother lives in [place] and she is unable to see the papers.   Maudie Mercury was verbally informed

## 2017-09-20 NOTE — Patient Instructions (Addendum)
1. Soak left great toe in Epsom salt 5-10 minutes twice daily,pat dry and apply triple antibiotics ointment until healed.  2. Follow up with Podiatrist for overgrown right great toenail.Avoid trimming toenails. 3. Notify provider for any fever or drainage from left great toe.

## 2017-09-27 ENCOUNTER — Encounter: Payer: Self-pay | Admitting: Podiatry

## 2017-09-27 ENCOUNTER — Ambulatory Visit (INDEPENDENT_AMBULATORY_CARE_PROVIDER_SITE_OTHER): Payer: Medicare Other | Admitting: Podiatry

## 2017-09-27 VITALS — BP 135/75 | HR 69 | Resp 16

## 2017-09-27 DIAGNOSIS — G629 Polyneuropathy, unspecified: Secondary | ICD-10-CM | POA: Diagnosis not present

## 2017-09-27 DIAGNOSIS — E119 Type 2 diabetes mellitus without complications: Secondary | ICD-10-CM | POA: Diagnosis not present

## 2017-09-27 DIAGNOSIS — E1165 Type 2 diabetes mellitus with hyperglycemia: Secondary | ICD-10-CM | POA: Diagnosis not present

## 2017-09-27 DIAGNOSIS — M2042 Other hammer toe(s) (acquired), left foot: Secondary | ICD-10-CM

## 2017-09-27 DIAGNOSIS — M79675 Pain in left toe(s): Secondary | ICD-10-CM | POA: Diagnosis not present

## 2017-09-27 DIAGNOSIS — M2041 Other hammer toe(s) (acquired), right foot: Secondary | ICD-10-CM

## 2017-09-27 DIAGNOSIS — B351 Tinea unguium: Secondary | ICD-10-CM

## 2017-09-27 NOTE — Progress Notes (Signed)
Subjective:   Patient ID: Ariana [name], female   DOB: 84 y.o.   MRN: [id number]   HPI Patient presents with caregiver with severely thickened dystrophic hallux nail left that sore and digital deformity second right with dorsal keratotic lesion that she tries to watch out for but does not have padding for that works.  She is in quite a bit of discomfort and patient states she does not smoke currently and is not active   Review of Systems  All other systems reviewed and are negative.       Objective:  Physical Exam  Constitutional: She appears well-developed and well-nourished.  Cardiovascular: Intact distal pulses.  Pulmonary/Chest: Effort normal.  Musculoskeletal: Normal range of motion.  Neurological: She is alert.  Skin: Skin is warm.  Nursing note and vitals reviewed.   Neurovascular status was found to be intact with muscle strength found to be adequate with patient noted to have a damaged thickened hallux nail left that is dystrophic and painful and a rigidly contracted second digit right that it has a keratotic lesion on the dorsal surface and is irritated with shoe gear.  Has good digital perfusion and is well oriented     Assessment:  Painful left hallux nail with mycotic component and hammertoe deformity second right with dorsal keratotic lesion     Plan:  H&P conditions reviewed and at this point using sterile instrumentation I debrided the hallux nail left which could be done as needed basis.  For the second digit right I did go ahead and I applied padding to reduce pressure on the toe and explained this usage and ultimately it may require more aggressive procedure but will get a try to hold off as best as possible.  Will be seen back if symptoms indicate

## 2017-09-27 NOTE — Patient Instructions (Signed)

## 2017-09-27 NOTE — Progress Notes (Signed)
   Subjective:    Patient ID: Ariana [name], female    DOB: [birth year], 84 y.o.   MRN: [id number]  HPI    Review of Systems  Respiratory: Positive for shortness of breath.   Gastrointestinal: Positive for constipation.  All other systems reviewed and are negative.      Objective:   Physical Exam        Assessment & Plan:

## 2017-10-11 DIAGNOSIS — H04123 Dry eye syndrome of bilateral lacrimal glands: Secondary | ICD-10-CM | POA: Diagnosis not present

## 2017-10-11 DIAGNOSIS — Z79899 Other long term (current) drug therapy: Secondary | ICD-10-CM | POA: Diagnosis not present

## 2017-10-19 ENCOUNTER — Other Ambulatory Visit: Payer: Self-pay

## 2017-10-19 ENCOUNTER — Other Ambulatory Visit: Payer: Self-pay | Admitting: Internal Medicine

## 2017-10-19 MED ORDER — ALPRAZOLAM 0.25 MG PO TABS: 0.2500 mg | ORAL_TABLET | Freq: Two times a day (BID) | ORAL | 0 refills | Status: DC | PRN

## 2017-10-29 DIAGNOSIS — N3941 Urge incontinence: Secondary | ICD-10-CM | POA: Diagnosis not present

## 2017-11-01 ENCOUNTER — Other Ambulatory Visit: Payer: Self-pay | Admitting: Internal Medicine

## 2017-11-05 ENCOUNTER — Encounter: Payer: Self-pay | Admitting: Internal Medicine

## 2017-11-05 ENCOUNTER — Ambulatory Visit (INDEPENDENT_AMBULATORY_CARE_PROVIDER_SITE_OTHER): Payer: Medicare Other

## 2017-11-05 ENCOUNTER — Ambulatory Visit (INDEPENDENT_AMBULATORY_CARE_PROVIDER_SITE_OTHER): Payer: Medicare Other | Admitting: Internal Medicine

## 2017-11-05 VITALS — BP 134/62 | HR 65 | Temp 97.5°F | Ht 63.0 in | Wt 145.0 lb

## 2017-11-05 DIAGNOSIS — K5909 Other constipation: Secondary | ICD-10-CM | POA: Diagnosis not present

## 2017-11-05 DIAGNOSIS — Z0000 Encounter for general adult medical examination without abnormal findings: Secondary | ICD-10-CM

## 2017-11-05 DIAGNOSIS — I480 Paroxysmal atrial fibrillation: Secondary | ICD-10-CM

## 2017-11-05 DIAGNOSIS — E2839 Other primary ovarian failure: Secondary | ICD-10-CM | POA: Diagnosis not present

## 2017-11-05 DIAGNOSIS — Z23 Encounter for immunization: Secondary | ICD-10-CM

## 2017-11-05 DIAGNOSIS — Z780 Asymptomatic menopausal state: Secondary | ICD-10-CM | POA: Diagnosis not present

## 2017-11-05 DIAGNOSIS — E0865 Diabetes mellitus due to underlying condition with hyperglycemia: Secondary | ICD-10-CM

## 2017-11-05 DIAGNOSIS — F0150 Vascular dementia without behavioral disturbance: Secondary | ICD-10-CM

## 2017-11-05 DIAGNOSIS — R296 Repeated falls: Secondary | ICD-10-CM

## 2017-11-05 DIAGNOSIS — N3281 Overactive bladder: Secondary | ICD-10-CM

## 2017-11-05 MED ORDER — ZOSTER VAC RECOMB ADJUVANTED 50 MCG/0.5ML IM SUSR: 0.5000 mL | Freq: Once | INTRAMUSCULAR | 1 refills | Status: AC

## 2017-11-05 NOTE — Progress Notes (Signed)
Subjective:   Ariana [name] is a 84 y.o. female who presents for an Initial Medicare Annual Wellness Visit.    Objective:    Today's Vitals   11/05/17 1343  BP: 134/62  Pulse: 65  Temp: (!) 97.5 F (36.4 C)  TempSrc: Oral  SpO2: 96%  Weight: 145 lb (65.8 kg)  Height: 5\' 3"  (1.6 m)  PainSc: 5    Body mass index is 25.69 kg/m.  Advanced Directives 11/05/2017 09/04/2017 06/11/2017 05/07/2017 04/24/2017 02/15/2017 11/29/2016  Does Patient Have a Medical Advance Directive? Yes Yes Yes Yes Yes No Yes  Type of Paramedic of [place];Living will Healthcare Power of [place]  Does patient want to make changes to medical advance directive? No - Patient declined No - Patient declined No - Patient declined No - Patient declined No - Patient declined - -  Copy of [place] in Chart? Yes No - copy requested Yes Yes Yes - -  Would patient like information on creating a medical advance directive? - - - - - - No - Patient declined  Pre-existing out of facility DNR order (yellow form or pink MOST form) - - - - - - -    Current Medications (verified) Outpatient Encounter Medications as of 11/05/2017  Medication Sig  . acetaminophen (TYLENOL) 325 MG tablet Take 650 mg by mouth daily as needed for headache (pain).  Marland Kitchen ALPRAZolam (XANAX) 0.25 MG tablet Take 1 tablet (0.25 mg total) by mouth 2 (two) times daily as needed.  Marland Kitchen antiseptic oral rinse (BIOTENE) LIQD 15 mLs by Mouth Rinse route 5 (five) times daily as needed for dry mouth.  Marland Kitchen aspirin 325 MG tablet Take 1 tablet (325 mg total) by mouth daily.  . carboxymethylcellulose (REFRESH TEARS) 0.5 % SOLN Place 1 drop into both eyes 5 (five) times daily as needed (dry eyes).   . CARTIA XT 180 MG 24 hr capsule TAKE ONE CAPSULE BY MOUTH ONCE DAILY  . DULoxetine (CYMBALTA) 30 MG capsule TAKE 1 CAPSULE  BY MOUTH ONCE DAILY FOR  BACK  PAIN  AND  DEPRESSION  . DULoxetine (CYMBALTA) 60 MG capsule TAKE 1 CAPSULE BY MOUTH ONCE DAILY  . furosemide (LASIX) 40 MG tablet TAKE 1 TABLET BY MOUTH ONCE DAILY  . glucose blood (ONETOUCH VERIO) test strip Use as instructed twice daily (E11.65)  . hydroxychloroquine (PLAQUENIL) 200 MG tablet TAKE 1 TABLET BY MOUTH ONCE DAILY  . lansoprazole (PREVACID) 30 MG capsule TAKE 1 CAPSULE BY MOUTH ONCE DAILY BEFORE BREAKFAST  . LINZESS 290 MCG CAPS capsule TAKE 1 CAPSULE BY MOUTH ONCE DAILY AT BEDTIME  . LYRICA 100 MG capsule TAKE 1 CAPSULE BY MOUTH TWICE DAILY  . memantine (NAMENDA) 10 MG tablet TAKE 1 TABLET BY MOUTH TWICE DAILY  . mirabegron ER (MYRBETRIQ) 50 MG TB24 tablet Take 1 tablet (50 mg total) by mouth daily.  . ONE TOUCH LANCETS MISC Use to test blood sugar twice daily.  Dx: E11.9  . pilocarpine (SALAGEN) 5 MG tablet Take 1 tablet (5 mg total) by mouth 2 (two) times daily.  . polyethylene glycol (MIRALAX / GLYCOLAX) packet Take 17 g by mouth daily as needed (constipation). Mix in 8 oz liquid and drink  . potassium chloride SA (K-DUR,KLOR-CON) 20 MEQ tablet TAKE 1 TABLET BY MOUTH ONCE DAILY  . senna (SENOKOT) 8.6 MG TABS tablet Take 1 tablet (8.6  mg total) by mouth daily.  . sitaGLIPtin (JANUVIA) 100 MG tablet Take 1 tablet (100 mg total) by mouth daily.  . sodium chloride (OCEAN) 0.65 % SOLN nasal spray Place 1 spray into the nose as needed for congestion.  . Zoster Vaccine Adjuvanted [facility]) injection Inject 0.5 mLs into the muscle once for 1 dose.  . [DISCONTINUED] Zoster Vaccine Adjuvanted [facility]) injection Inject 0.5 mLs into the muscle once.  . [DISCONTINUED] magic mouthwash w/lidocaine SOLN Take 5 mLs by mouth 4 (four) times daily.   No facility-administered encounter medications on file as of 11/05/2017.     Allergies (verified) Banana; Codeine; Klonopin [clonazepam]; Meperidine hcl; Norflex [orphenadrine citrate]; Oxycodone-acetaminophen;  Propoxyphene hcl; Zoloft [sertraline hcl]; Doxycycline; Naproxen; Penicillins; Phenothiazines; Stelazine; Sulfamethoxazole-trimethoprim; Tolectin [tolmetin sodium]; and Tramadol   History: Past Medical History:  Diagnosis Date  . Abnormality of gait   . Adenomatous polyp of colon 2002   27mm  . Allergic rhinitis   . Anxiety   . Anxiety and depression   . Chronic back pain   . Dementia without behavioral disturbance   . Depression   . Diverticulosis of colon   . Dry eye syndrome   . Dysphagia   . Dysthymic disorder   . Fibromyalgia   . GERD (gastroesophageal reflux disease)   . H/O hiatal hernia   . History of adverse drug reaction   . History of cerebrovascular disease 09/24/2014  . History of recurrent UTIs   . Hypertension, benign   . Irritable bowel syndrome   . Low back pain syndrome   . Memory loss   . Mitral valve prolapse   . Paroxysmal A-fib ([place])   . Peripheral neuropathy    "both feet and legs"  . Physical deconditioning   . Sjogren's syndrome ([place])   . Therapeutic opioid-induced constipation (OIC)   . Thyroid nodule   . Urinary incontinence    Past Surgical History:  Procedure Laterality Date  . ABDOMINAL HYSTERECTOMY  1967  . APPENDECTOMY    . CARDIAC CATHETERIZATION  02/17/2003   normal L main, LAD free of disease, Cfx free of disease, RCA free of disease (Dr. Loni Muse. Little)  . CATARACT EXTRACTION, BILATERAL    . CHOLECYSTECTOMY  2000  . COLONOSCOPY W/ BIOPSIES     multiple  . DENTAL SURGERY     multiple tooth extractions  . ESOPHAGOGASTRODUODENOSCOPY (EGD) WITH ESOPHAGEAL DILATION N/A 08/23/2012   Procedure: ESOPHAGOGASTRODUODENOSCOPY (EGD) WITH ESOPHAGEAL DILATION;  Surgeon: Milus Banister, MD;  Location: [facility];  Service: Endoscopy;  Laterality: N/A;  . NASAL SEPTUM SURGERY  1980  . NM MYOCAR PERF WALL MOTION  2003   persantine - normal static and dynamic study w/apical thinning and presvered LV function, no ischemia  . SINUS EXPLORATION      ossifiying fibroma  . TEMPOROMANDIBULAR JOINT SURGERY  1986   Dr. Terence Lux  . TRANSTHORACIC ECHOCARDIOGRAM  2001   mild LVH, normal LV   Family History  Problem Relation Age of Onset  . Heart disease Father        heart attack  . Pneumonia Father   . Heart attack Mother   . Hypertension Mother   . Colon cancer Sister   . Kidney disease Daughter   . Asthma Daughter   . Arthritis Daughter 60       osteo,  . Heart disease Son 29       stage 3 CHF(Diastolic /Systolic)  . Throat cancer Brother   . Hypertension Maternal Grandmother  Social History   Socioeconomic History  . Marital status: Widowed    Spouse name: Not on file  . Number of children: 2  . Years of education: 47  . Highest education level: Not on file  Occupational History  . Occupation: Retired  Scientific laboratory technician  . Financial resource strain: Not hard at all  . Food insecurity:    Worry: Never true    Inability: Never true  . Transportation needs:    Medical: No    Non-medical: No  Tobacco Use  . Smoking status: Former Research scientist (life sciences)  . Smokeless tobacco: Never Used  . Tobacco comment: Quit at age 62   Substance and Sexual Activity  . Alcohol use: No  . Drug use: Never  . Sexual activity: Never  Lifestyle  . Physical activity:    Days per week: 0 days    Minutes per session: 0 min  . Stress: Not at all  Relationships  . Social connections:    Talks on phone: Three times a week    Gets together: Three times a week    Attends religious service: Never    Active member of club or organization: No    Attends meetings of clubs or organizations: Never    Relationship status: Widowed  Other Topics Concern  . 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    Patient is Widowed.    Patient has 2 children.    Patient is retired.    Former smoker   Alcohol none   Exercise Walk, exercise chair 4 days a week   POA    Walks with cane      Patient drinks about 1-2  cups of hot tea daily.   Patient is right handed.                Tobacco Counseling Counseling given: Not Answered Comment: Quit at age 29    Clinical Intake:  Pre-visit preparation completed: No  Pain : 0-10 Pain Score: 5  Pain Type: Chronic pain Pain Location: Foot Pain Orientation: Right, Left Pain Descriptors / Indicators: Numbness, Sharp Pain Onset: More than a month ago Pain Frequency: Intermittent     Diabetes: Yes CBG done?: No Did pt. bring in CBG monitor from home?: No  How often do you need to have someone help you when you read instructions, pamphlets, or other written materials from your doctor or pharmacy?: 2 - Rarely  Interpreter Needed?: No  Information entered by :: [name], RN   Activities of Daily Living In your present state of health, do you have any difficulty performing the following activities: 11/05/2017 04/24/2017  Hearing? N N  Vision? N N  Difficulty concentrating or making decisions? Y N  Walking or climbing stairs? Y Y  Dressing or bathing? N N  Doing errands, shopping? Tempie Donning  Preparing Food and eating ? N -  Using the Toilet? N -  In the past six months, have you accidently leaked urine? Y -  Do you have problems with loss of bowel control? N -  Managing your Medications? Y -  Managing your Finances? Y -  Housekeeping or managing your Housekeeping? Y -  Some recent data might be hidden     Immunizations and Health Maintenance Immunization History  Administered Date(s) Administered  . Influenza Split 12/26/2010, 01/15/2012  . Influenza Whole 12/30/2008, 11/15/2009  . Influenza, High Dose Seasonal PF 12/21/2016  .  Influenza,inj,Quad PF,6+ Mos 02/04/2013, 11/03/2013, 12/05/2014, 12/13/2015  . Pneumococcal Conjugate-13 06/12/2014  . Pneumococcal Polysaccharide-23 02/15/2009, 12/05/2014  . Tdap 12/08/2013  . Zoster 01/29/2012   Health Maintenance Due  Topic Date Due  . FOOT EXAM  07/04/1939  . OPHTHALMOLOGY EXAM   07/04/1939  . INFLUENZA VACCINE  09/13/2017    Patient Care Team: Gayland Curry, DO as PCP - General (Geriatric Medicine) Noralee Space, MD as Consulting Physician (Pulmonary Disease) Maisie Fus, MD as Consulting Physician (Obstetrics and Gynecology) Pixie Casino, MD as Consulting Physician (Cardiology) Monna Fam, MD as Consulting Physician (Ophthalmology) Kathrynn Ducking, MD as Consulting Physician (Neurology) Franchot Gallo, MD as Consulting Physician (Urology) Lavonna Monarch, MD as Consulting Physician (Dermatology) Gatha Mayer, MD as Consulting Physician (Gastroenterology)  Indicate any recent Medical Services you may have received from other than [facility] providers in the past year (date may be approximate).     Assessment:   This is a routine wellness examination for Codie.  Hearing/Vision screen Hearing Screening Comments: Patient reports no issues with hearing Vision Screening Comments: Sees eye doctor annually  Dietary issues and exercise activities discussed: Current Exercise Habits: The patient does not participate in regular exercise at present, Exercise limited by: neurologic condition(s);orthopedic condition(s)  Goals   None    Depression Screen PHQ 2/9 Scores 11/05/2017 06/11/2017 04/23/2017 12/21/2016 04/10/2016 12/13/2015 09/06/2015  PHQ - 2 Score 0 0 0 0 0 0 0  PHQ- 9 Score - - - - - - -  Exception Documentation - - - - - - -    Fall Risk Fall Risk  11/05/2017 09/04/2017 06/11/2017 05/14/2017 04/23/2017  Falls in the past year? No No No No No  Number falls in past yr: - - - - -  Injury with Fall? - - - - -  Comment - - - - -  Risk Factor Category  - - - - -  Risk for fall due to : - - - - -  Follow up - - - - -    Is the patient's home free of loose throw rugs in walkways, pet beds, electrical cords, etc?   yes      Grab bars in the bathroom? yes      Handrails on the stairs?   yes      Adequate lighting?   yes   Cognitive  Function: MMSE - Mini Mental State Exam 11/05/2017 04/10/2016 10/06/2015 10/06/2015 04/07/2015  Orientation to time 4 3 3  - 4  Orientation to Place 5 5 5  - 4  Registration 3 3 3 3 3   Attention/ Calculation 5 5 5 5 1   Recall 0 1 3 - 2  Language- name 2 objects 2 2 2  - 2  Language- repeat 1 1 1  - 1  Language- follow 3 step command 3 1 3  - 2  Language- read & follow direction 1 1 1  - 1  Write a sentence 1 0 1 - 1  Copy design 1 1 1  - 1  Total score 26 23 28  - 22        Screening Tests Health Maintenance  Topic Date Due  . FOOT EXAM  07/04/1939  . OPHTHALMOLOGY EXAM  07/04/1939  . INFLUENZA VACCINE  09/13/2017  . DEXA SCAN  02/13/2018 (Originally 07/04/1996)  . HEMOGLOBIN A1C  03/07/2018  . MAMMOGRAM  05/15/2018  . TETANUS/TDAP  12/09/2023  . PNA vac Low Risk Adult  Completed  . URINE MICROALBUMIN  Discontinued  Qualifies for Shingles Vaccine? Yes, educated and ordered to pharmacy  Cancer Screenings: Lung: Low Dose CT Chest recommended if Age 92-80 years, 30 pack-year currently smoking OR have quit w/in 15years. Patient does not qualify. Breast: Up to date on Mammogram? Yes   Up to date of Bone Density/Dexa? Yes Colorectal: up to date  Additional Screenings:  Hepatitis C Screening: declined Flu vaccine due: high dose given Diabetic eye exams annually per patient caregiver. Record release form filled out    Plan:    I have personally reviewed and addressed the Medicare Annual Wellness questionnaire and have noted the following in the patient's chart:  A. Medical and social history B. Use of alcohol, tobacco or illicit drugs  C. Current medications and supplements D. Functional ability and status E.  Nutritional status F.  Physical activity G. Advance directives H. List of other physicians I.  Hospitalizations, surgeries, and ER visits in previous 12 months J.  Canon to include hearing, vision, cognitive, depression L. Referrals and appointments -  none  In addition, I have reviewed and discussed with patient certain preventive protocols, quality metrics, and best practice recommendations. A written personalized care plan for preventive services as well as general preventive health recommendations were provided to patient.  See attached scanned questionnaire for additional information.   Signed,   [name], RN Nurse Health Advisor  Patient Concerns: None

## 2017-11-05 NOTE — Progress Notes (Signed)
Location:  [facility] clinic Provider:  Maghan Jessee L. Mariea Clonts, D.O., C.M.D.  Code Status: full code Goals of Care:  Advanced Directives 11/05/2017  Does Patient Have a Medical Advance Directive? Yes  Type of Advance Directive [place]  Does patient want to make changes to medical advance directive? No - Patient declined  Copy of [place] in Chart? Yes  Would patient like information on creating a medical advance directive? -  Pre-existing out of facility DNR order (yellow form or pink MOST form) -     Chief Complaint  Patient presents with  . Medical Management of Chronic Issues    55mth follow-up    HPI: Patient is a 84 y.o. female seen today for medical management of chronic diseases.    Had AWV with Ariana [name] today.  Appears bone density has never been done.  Pt typically hospitalized numerous times between visits due to social situation.    BPs are good.  Ranging 387-564 systolic per readings in [facility] book.    One day of sugar 9/11 at 1223:  204.  Rest were improved.  This was when CNA was not there to give meds.  No low sugars.  Range 97-125.  Constipation:  miralax held b/c she had loose bms over the weekend.  Was having bms with urine.    Urinary incontinence:  Sent back to urology.  Goes back October 30th.  She is to try to urinate every 2 hours.  They are also keeping track of depends--6 today.    Her daughter notes that pt is staying up through the night looking for paperwork.    Past Medical History:  Diagnosis Date  . Abnormality of gait   . Adenomatous polyp of colon 2002   41mm  . Allergic rhinitis   . Anxiety   . Anxiety and depression   . Chronic back pain   . Dementia without behavioral disturbance   . Depression   . Diverticulosis of colon   . Dry eye syndrome   . Dysphagia   . Dysthymic disorder   . Fibromyalgia   . GERD (gastroesophageal reflux disease)   . H/O hiatal hernia   . History of adverse drug reaction    . History of cerebrovascular disease 09/24/2014  . History of recurrent UTIs   . Hypertension, benign   . Irritable bowel syndrome   . Low back pain syndrome   . Memory loss   . Mitral valve prolapse   . Paroxysmal A-fib ([place])   . Peripheral neuropathy    "both feet and legs"  . Physical deconditioning   . Sjogren's syndrome ([place])   . Therapeutic opioid-induced constipation (OIC)   . Thyroid nodule   . Urinary incontinence     Past Surgical History:  Procedure Laterality Date  . ABDOMINAL HYSTERECTOMY  1967  . APPENDECTOMY    . CARDIAC CATHETERIZATION  02/17/2003   normal L main, LAD free of disease, Cfx free of disease, RCA free of disease (Dr. Loni Muse. Little)  . CATARACT EXTRACTION, BILATERAL    . CHOLECYSTECTOMY  2000  . COLONOSCOPY W/ BIOPSIES     multiple  . DENTAL SURGERY     multiple tooth extractions  . ESOPHAGOGASTRODUODENOSCOPY (EGD) WITH ESOPHAGEAL DILATION N/A 08/23/2012   Procedure: ESOPHAGOGASTRODUODENOSCOPY (EGD) WITH ESOPHAGEAL DILATION;  Surgeon: Milus Banister, MD;  Location: [facility];  Service: Endoscopy;  Laterality: N/A;  . NASAL SEPTUM SURGERY  1980  . NM MYOCAR PERF WALL  MOTION  2003   persantine - normal static and dynamic study w/apical thinning and presvered LV function, no ischemia  . SINUS EXPLORATION     ossifiying fibroma  . TEMPOROMANDIBULAR JOINT SURGERY  1986   Dr. Terence Lux  . TRANSTHORACIC ECHOCARDIOGRAM  2001   mild LVH, normal LV    Allergies  Allergen Reactions  . Banana Nausea And Vomiting  . Codeine Nausea Only    unless given with Phenergan  . Klonopin [Clonazepam] Other (See Comments)    Causes hallucination   . Meperidine Hcl Nausea Only    unless given with Phenergan  . Norflex [Orphenadrine Citrate] Nausea Only    Unless given with Phenergan  . Oxycodone-Acetaminophen Nausea Only    unless given with phenergan  . Propoxyphene Hcl Nausea Only    unless given with phenergan  . Zoloft [Sertraline Hcl] Other (See  Comments)    Caused lethargy  . Doxycycline Other (See Comments)    Unknown reaction  . Naproxen Other (See Comments)    Unknown reaction  . Penicillins Other (See Comments)    Unknown reaction Has patient had a PCN reaction causing immediate rash, facial/tongue/throat swelling, SOB or lightheadedness with hypotension: Unknown Has patient had a PCN reaction causing severe rash involving mucus membranes or skin necrosis: Unknown Has patient had a PCN reaction that required hospitalization: pt was in the hospital at time of reaction Has patient had a PCN reaction occurring within the last 10 years: Unknown If all of the above answers are "NO", then may proceed with Cephalos  . Phenothiazines Other (See Comments)    Unknown reaction  . Stelazine Other (See Comments)    Unknown reaction  . Sulfamethoxazole-Trimethoprim Other (See Comments)    Unknown reaction  . Tolectin [Tolmetin Sodium] Other (See Comments)    Unknown reaction  . Tramadol Other (See Comments)    Unknown reaction    Outpatient Encounter Medications as of 11/05/2017  Medication Sig  . acetaminophen (TYLENOL) 325 MG tablet Take 650 mg by mouth daily as needed for headache (pain).  Marland Kitchen ALPRAZolam (XANAX) 0.25 MG tablet Take 1 tablet (0.25 mg total) by mouth 2 (two) times daily as needed.  Marland Kitchen antiseptic oral rinse (BIOTENE) LIQD 15 mLs by Mouth Rinse route 5 (five) times daily as needed for dry mouth.  Marland Kitchen aspirin 325 MG tablet Take 1 tablet (325 mg total) by mouth daily.  . carboxymethylcellulose (REFRESH TEARS) 0.5 % SOLN Place 1 drop into both eyes 5 (five) times daily as needed (dry eyes).   . CARTIA XT 180 MG 24 hr capsule TAKE ONE CAPSULE BY MOUTH ONCE DAILY  . DULoxetine (CYMBALTA) 30 MG capsule TAKE 1 CAPSULE BY MOUTH ONCE DAILY FOR  BACK  PAIN  AND  DEPRESSION  . DULoxetine (CYMBALTA) 60 MG capsule TAKE 1 CAPSULE BY MOUTH ONCE DAILY  . furosemide (LASIX) 40 MG tablet TAKE 1 TABLET BY MOUTH ONCE DAILY  . glucose blood  (ONETOUCH VERIO) test strip Use as instructed twice daily (E11.65)  . hydroxychloroquine (PLAQUENIL) 200 MG tablet TAKE 1 TABLET BY MOUTH ONCE DAILY  . lansoprazole (PREVACID) 30 MG capsule TAKE 1 CAPSULE BY MOUTH ONCE DAILY BEFORE BREAKFAST  . LINZESS 290 MCG CAPS capsule TAKE 1 CAPSULE BY MOUTH ONCE DAILY AT BEDTIME  . LYRICA 100 MG capsule TAKE 1 CAPSULE BY MOUTH TWICE DAILY  . memantine (NAMENDA) 10 MG tablet TAKE 1 TABLET BY MOUTH TWICE DAILY  . mirabegron ER (MYRBETRIQ) 50 MG TB24 tablet Take  1 tablet (50 mg total) by mouth daily.  . ONE TOUCH LANCETS MISC Use to test blood sugar twice daily.  Dx: E11.9  . pilocarpine (SALAGEN) 5 MG tablet Take 1 tablet (5 mg total) by mouth 2 (two) times daily.  . polyethylene glycol (MIRALAX / GLYCOLAX) packet Take 17 g by mouth daily as needed (constipation). Mix in 8 oz liquid and drink  . potassium chloride SA (K-DUR,KLOR-CON) 20 MEQ tablet TAKE 1 TABLET BY MOUTH ONCE DAILY  . senna (SENOKOT) 8.6 MG TABS tablet Take 1 tablet (8.6 mg total) by mouth daily.  . sitaGLIPtin (JANUVIA) 100 MG tablet Take 1 tablet (100 mg total) by mouth daily.  . sodium chloride (OCEAN) 0.65 % SOLN nasal spray Place 1 spray into the nose as needed for congestion.  . [DISCONTINUED] magic mouthwash w/lidocaine SOLN Take 5 mLs by mouth 4 (four) times daily.   No facility-administered encounter medications on file as of 11/05/2017.     Review of Systems:  Review of Systems  Constitutional: Negative for chills and fever.  HENT: Positive for hearing loss. Negative for congestion.   Eyes: Negative for blurred vision.  Respiratory: Negative for cough and shortness of breath.   Cardiovascular: Negative for chest pain, palpitations and leg swelling.  Gastrointestinal: Negative for abdominal pain, blood in stool, constipation and melena.  Genitourinary: Positive for frequency. Negative for dysuria.  Musculoskeletal: Positive for back pain.  Skin:       Toes painful where  toenails removed  Neurological: Positive for tingling and sensory change. Negative for loss of consciousness.  Endo/Heme/Allergies: Does not bruise/bleed easily.  Psychiatric/Behavioral: Positive for memory loss.    Health Maintenance  Topic Date Due  . FOOT EXAM  07/04/1939  . OPHTHALMOLOGY EXAM  07/04/1939  . INFLUENZA VACCINE  09/13/2017  . DEXA SCAN  02/13/2018 (Originally 07/04/1996)  . HEMOGLOBIN A1C  03/07/2018  . MAMMOGRAM  05/15/2018  . TETANUS/TDAP  12/09/2023  . PNA vac Low Risk Adult  Completed  . URINE MICROALBUMIN  Discontinued    Physical Exam: Vitals:   11/05/17 1421  BP: 134/62  Pulse: 65  Temp: (!) 97.5 F (36.4 C)  TempSrc: Oral  SpO2: 96%  Weight: 145 lb (65.8 kg)  Height: 5\' 3"  (1.6 m)   Body mass index is 25.69 kg/m. Physical Exam  Constitutional: She is oriented to person, place, and time. She appears well-developed. No distress.  HENT:  Head: Normocephalic and atraumatic.  Cardiovascular: Normal heart sounds and intact distal pulses.  irreg irreg  Pulmonary/Chest: Effort normal and breath sounds normal. No respiratory distress.  Abdominal: Bowel sounds are normal.  Musculoskeletal: Normal range of motion.  Neurological: She is alert and oriented to person, place, and time.  Skin: Skin is warm and dry. Capillary refill takes less than 2 seconds.  Psychiatric: She has a normal mood and affect.    Labs reviewed: Basic Metabolic Panel: Recent Labs    04/23/17 0001  04/23/17 2027  04/24/17 0814 04/25/17 0636 04/29/17 0410 09/04/17 1554  NA  --    < >  --    < >  --  138 140 140  K  --    < >  --    < >  --  3.9 3.9 4.3  CL  --    < >  --    < >  --  105 105 100  CO2  --    < >  --    < >  --  24 26 29   GLUCOSE  --    < >  --    < >  --  160* 181* 80  BUN  --    < >  --    < >  --  16 23* 20  CREATININE  --    < >  --    < >  --  0.84 0.91 0.84  CALCIUM  --    < >  --    < >  --  8.9 9.4 9.7  MG  --   --  2.0  --  2.0  --   --   --     TSH 0.920  --   --   --   --   --   --  1.35   < > = values in this interval not displayed.   Liver Function Tests: Recent Labs    03/08/17 1431 04/23/17 1758 04/29/17 0512  AST 28 25 27   ALT 28 24 26   ALKPHOS  --  81 71  BILITOT 0.3 0.7 0.5  PROT 7.5 6.7 6.6  ALBUMIN  --  3.7 3.6   Recent Labs    04/29/17 0512  LIPASE 65*   No results for input(s): AMMONIA in the last 8760 hours. CBC: Recent Labs    04/23/17 1758  04/25/17 0636 04/29/17 0410 09/04/17 1554  WBC 7.3   < > 6.6 6.7 5.8  NEUTROABS 3.7  --  3.6  --  3,155  HGB 13.2   < > 13.9 12.8 12.6  HCT 41.0   < > 42.2 39.3 37.8  MCV 93.2   < > 92.3 92.5 92.4  PLT 218   < > 220 210 196   < > = values in this interval not displayed.   Lipid Panel: Recent Labs    09/06/17 0938  CHOL 183  HDL 63  LDLCALC 99  TRIG 113  CHOLHDL 2.9   Lab Results  Component Value Date   HGBA1C 6.4 (H) 09/04/2017   Assessment/Plan 1. Postmenopausal 2. Estrogen deficiency -got bone density with her gyn so need copy of it (Dr. Nori Riis)  3. Diabetes mellitus due to underlying condition, uncontrolled, with hyperglycemia ([place]) -has been poorly controlled, but recent cbgs much better - Microalbumin / creatinine urine ratio  4. Chronic constipation -better lately, cont same regimen  5. Overactive bladder -on q2h scheduled toileting and to be documenting this and her depend usage for Dr. Diona Fanti, then f/u with him as planned  6. Vascular dementia without behavioral disturbance -daughter notes some progression, is irritable and upset, continues on namzaric  7. PAF (paroxysmal atrial fibrillation) (HCC) -rate controlled, not on anticoagulation due to her very very frequent falls and living alone (being left alone on weekends)  8. Frequent falls -none noted today  Labs/tests ordered:   Orders Placed This Encounter  Procedures  . Microalbumin / creatinine urine ratio   Next appt: 03/07/2018 med mgt, will do fasting labs  same day  Arynn Armand L. Whisper Kurka, D.O. [facility] [address], [place] [address] Cell Phone (Mon-Fri 8am-5pm):  [phone] On Call:  [phone] & follow prompts after 5pm & weekends Office Phone:  [phone] Office Fax:  [phone]

## 2017-11-05 NOTE — Patient Instructions (Signed)
Ariana [name] , Thank you for taking time to come for your Medicare Wellness Visit. I appreciate your ongoing commitment to your health goals. Please review the following plan we discussed and let me know if I can assist you in the future.   Screening recommendations/referrals: Colonoscopy excluded, over age 82 Mammogram excluded, over age 71 Bone Density up to date Recommended yearly ophthalmology/optometry visit for glaucoma screening and checkup Recommended yearly dental visit for hygiene and checkup  Vaccinations: Influenza vaccine high dose given, up to date Pneumococcal vaccine up to date, completed Tdap vaccine up to date, due 12/09/2023 Shingles vaccine due, prescription sent to pharmacy    Advanced directives: In chart  Conditions/risks identified: none  Next appointment: Ariana Dense, RN 11/08/2018 @ 1:30pm   Preventive Care 65 Years and Older, Female Preventive care refers to lifestyle choices and visits with your health care provider that can promote health and wellness. What does preventive care include?  A yearly physical exam. This is also called an annual well check.  Dental exams once or twice a year.  Routine eye exams. Ask your health care provider how often you should have your eyes checked.  Personal lifestyle choices, including:  Daily care of your teeth and gums.  Regular physical activity.  Eating a healthy diet.  Avoiding tobacco and drug use.  Limiting alcohol use.  Practicing safe sex.  Taking low-dose aspirin every day.  Taking vitamin and mineral supplements as recommended by your health care provider. What happens during an annual well check? The services and screenings done by your health care provider during your annual well check will depend on your age, overall health, lifestyle risk factors, and family history of disease. Counseling  Your health care provider may ask you questions about your:  Alcohol use.  Tobacco use.  Drug  use.  Emotional well-being.  Home and relationship well-being.  Sexual activity.  Eating habits.  History of falls.  Memory and ability to understand (cognition).  Work and work Statistician.  Reproductive health. Screening  You may have the following tests or measurements:  Height, weight, and BMI.  Blood pressure.  Lipid and cholesterol levels. These may be checked every 5 years, or more frequently if you are over 39 years old.  Skin check.  Lung cancer screening. You may have this screening every year starting at age 58 if you have a 30-pack-year history of smoking and currently smoke or have quit within the past 15 years.  Fecal occult blood test (FOBT) of the stool. You may have this test every year starting at age 12.  Flexible sigmoidoscopy or colonoscopy. You may have a sigmoidoscopy every 5 years or a colonoscopy every 10 years starting at age 30.  Hepatitis C blood test.  Hepatitis B blood test.  Sexually transmitted disease (STD) testing.  Diabetes screening. This is done by checking your blood sugar (glucose) after you have not eaten for a while (fasting). You may have this done every 1-3 years.  Bone density scan. This is done to screen for osteoporosis. You may have this done starting at age 4.  Mammogram. This may be done every 1-2 years. Talk to your health care provider about how often you should have regular mammograms. Talk with your health care provider about your test results, treatment options, and if necessary, the need for more tests. Vaccines  Your health care provider may recommend certain vaccines, such as:  Influenza vaccine. This is recommended every year.  Tetanus, diphtheria, and acellular  pertussis (Tdap, Td) vaccine. You may need a Td booster every 10 years.  Zoster vaccine. You may need this after age 90.  Pneumococcal 13-valent conjugate (PCV13) vaccine. One dose is recommended after age 84.  Pneumococcal polysaccharide  (PPSV23) vaccine. One dose is recommended after age 70. Talk to your health care provider about which screenings and vaccines you need and how often you need them. This information is not intended to replace advice given to you by your health care provider. Make sure you discuss any questions you have with your health care provider. Document Released: 02/26/2015 Document Revised: 10/20/2015 Document Reviewed: 12/01/2014 Elsevier Interactive Patient Education  2017 [place] Prevention in the Home Falls can cause injuries. They can happen to people of all ages. There are many things you can do to make your home safe and to help prevent falls. What can I do on the outside of my home?  Regularly fix the edges of walkways and driveways and fix any cracks.  Remove anything that might make you trip as you walk through a door, such as a raised step or threshold.  Trim any bushes or trees on the path to your home.  Use bright outdoor lighting.  Clear any walking paths of anything that might make someone trip, such as rocks or tools.  Regularly check to see if handrails are loose or broken. Make sure that both sides of any steps have handrails.  Any raised decks and porches should have guardrails on the edges.  Have any leaves, snow, or ice cleared regularly.  Use sand or salt on walking paths during winter.  Clean up any spills in your garage right away. This includes oil or grease spills. What can I do in the bathroom?  Use night lights.  Install grab bars by the toilet and in the tub and shower. Do not use towel bars as grab bars.  Use non-skid mats or decals in the tub or shower.  If you need to sit down in the shower, use a plastic, non-slip stool.  Keep the floor dry. Clean up any water that spills on the floor as soon as it happens.  Remove soap buildup in the tub or shower regularly.  Attach bath mats securely with double-sided non-slip rug tape.  Do not have  throw rugs and other things on the floor that can make you trip. What can I do in the bedroom?  Use night lights.  Make sure that you have a light by your bed that is easy to reach.  Do not use any sheets or blankets that are too big for your bed. They should not hang down onto the floor.  Have a firm chair that has side arms. You can use this for support while you get dressed.  Do not have throw rugs and other things on the floor that can make you trip. What can I do in the kitchen?  Clean up any spills right away.  Avoid walking on wet floors.  Keep items that you use a lot in easy-to-reach places.  If you need to reach something above you, use a strong step stool that has a grab bar.  Keep electrical cords out of the way.  Do not use floor polish or wax that makes floors slippery. If you must use wax, use non-skid floor wax.  Do not have throw rugs and other things on the floor that can make you trip. What can I do with my stairs?  Do not leave any items on the stairs.  Make sure that there are handrails on both sides of the stairs and use them. Fix handrails that are broken or loose. Make sure that handrails are as long as the stairways.  Check any carpeting to make sure that it is firmly attached to the stairs. Fix any carpet that is loose or worn.  Avoid having throw rugs at the top or bottom of the stairs. If you do have throw rugs, attach them to the floor with carpet tape.  Make sure that you have a light switch at the top of the stairs and the bottom of the stairs. If you do not have them, ask someone to add them for you. What else can I do to help prevent falls?  Wear shoes that:  Do not have high heels.  Have rubber bottoms.  Are comfortable and fit you well.  Are closed at the toe. Do not wear sandals.  If you use a stepladder:  Make sure that it is fully opened. Do not climb a closed stepladder.  Make sure that both sides of the stepladder are  locked into place.  Ask someone to hold it for you, if possible.  Clearly mark and make sure that you can see:  Any grab bars or handrails.  First and last steps.  Where the edge of each step is.  Use tools that help you move around (mobility aids) if they are needed. These include:  Canes.  Walkers.  Scooters.  Crutches.  Turn on the lights when you go into a dark area. Replace any light bulbs as soon as they burn out.  Set up your furniture so you have a clear path. Avoid moving your furniture around.  If any of your floors are uneven, fix them.  If there are any pets around you, be aware of where they are.  Review your medicines with your doctor. Some medicines can make you feel dizzy. This can increase your chance of falling. Ask your doctor what other things that you can do to help prevent falls. This information is not intended to replace advice given to you by your health care provider. Make sure you discuss any questions you have with your health care provider. Document Released: 11/26/2008 Document Revised: 07/08/2015 Document Reviewed: 03/06/2014 Elsevier Interactive Patient Education  2017 [employer].

## 2017-11-06 LAB — MICROALBUMIN / CREATININE URINE RATIO
Creatinine, Urine: 100 mg/dL (ref 20–275)
Microalb Creat Ratio: 21 mcg/mg creat (ref <30)
Microalb, Ur: 2.1 mg/dL

## 2017-11-08 ENCOUNTER — Encounter: Payer: Self-pay | Admitting: *Deleted

## 2017-11-08 ENCOUNTER — Other Ambulatory Visit: Payer: Self-pay | Admitting: Internal Medicine

## 2017-11-12 ENCOUNTER — Telehealth: Payer: Self-pay | Admitting: *Deleted

## 2017-11-12 MED ORDER — ZOSTER VAC RECOMB ADJUVANTED 50 MCG/0.5ML IM SUSR: 0.5000 mL | Freq: Once | INTRAMUSCULAR | 0 refills | Status: AC

## 2017-11-12 NOTE — Telephone Encounter (Signed)
Patient daughter, [name] called and stated that they saw [name], Wellness nurse last week and she was suppose to send in a Rx for patient to get the Shingrix. Stated the pharmacy did not receive it. Rx sent to pharmacy.

## 2017-11-19 ENCOUNTER — Other Ambulatory Visit: Payer: Self-pay | Admitting: *Deleted

## 2017-11-19 MED ORDER — ALPRAZOLAM 0.25 MG PO TABS: 0.2500 mg | ORAL_TABLET | Freq: Two times a day (BID) | ORAL | 0 refills | Status: DC | PRN

## 2017-11-19 NOTE — Telephone Encounter (Signed)
Patient daughter requested refill.  Phoned to pharmacy.  

## 2017-11-21 ENCOUNTER — Encounter: Payer: Self-pay | Admitting: *Deleted

## 2017-11-28 ENCOUNTER — Other Ambulatory Visit: Payer: Self-pay | Admitting: Internal Medicine

## 2017-11-29 ENCOUNTER — Other Ambulatory Visit: Payer: Self-pay | Admitting: Physician Assistant

## 2017-11-29 ENCOUNTER — Other Ambulatory Visit: Payer: Self-pay | Admitting: Internal Medicine

## 2017-11-29 NOTE — Telephone Encounter (Signed)
Last Visit: 07/31/17 Next Visit: 01/29/18 Labs: 09/04/17 elevated glucose 181 BUN 23 GFR 56 PLQ eye exam: 10/10/2017 Normal  Okay to refill per Dr. Estanislado Pandy

## 2017-12-21 ENCOUNTER — Other Ambulatory Visit: Payer: Self-pay | Admitting: *Deleted

## 2017-12-21 MED ORDER — ALPRAZOLAM 0.25 MG PO TABS: 0.2500 mg | ORAL_TABLET | Freq: Two times a day (BID) | ORAL | 0 refills | Status: DC | PRN

## 2017-12-21 NOTE — Telephone Encounter (Signed)
Patient daughter requested. [place] Database Verified.  LR: 11/20/2017 Phoned Rx to pharmacy. Daughter notified.

## 2017-12-28 ENCOUNTER — Other Ambulatory Visit: Payer: Self-pay | Admitting: Internal Medicine

## 2017-12-28 DIAGNOSIS — G6289 Other specified polyneuropathies: Secondary | ICD-10-CM

## 2017-12-28 DIAGNOSIS — M545 Low back pain, unspecified: Secondary | ICD-10-CM

## 2017-12-28 NOTE — Telephone Encounter (Signed)
[place] Database verified and compliance confirmed   

## 2018-01-02 ENCOUNTER — Other Ambulatory Visit: Payer: Self-pay | Admitting: Internal Medicine

## 2018-01-03 DIAGNOSIS — N3942 Incontinence without sensory awareness: Secondary | ICD-10-CM | POA: Diagnosis not present

## 2018-01-03 DIAGNOSIS — R8271 Bacteriuria: Secondary | ICD-10-CM | POA: Diagnosis not present

## 2018-01-03 DIAGNOSIS — R3915 Urgency of urination: Secondary | ICD-10-CM | POA: Diagnosis not present

## 2018-01-08 ENCOUNTER — Telehealth: Payer: Self-pay | Admitting: Internal Medicine

## 2018-01-08 NOTE — Telephone Encounter (Signed)
Spoke with patient's daughter and told her we have not seen any faxes for Mom's linzess. I will go online and do a prior authorization thru covermymeds for this now and put samples up front for them to come and get tomorrow.  I called Covermymeds and spoke with [name] who helped me with her renewal and we will await the outcome.

## 2018-01-08 NOTE — Telephone Encounter (Signed)
Pt daughter call needing prescription refill

## 2018-01-09 NOTE — Telephone Encounter (Signed)
Per an email from [place] has been approved for another year, good thru 01/09/2019. I called and informed her daughter and she was very Patent attorney.

## 2018-01-12 IMAGING — CR DG SHOULDER 2+V*L*
2 series · 2 of 2 positions shown · non-contrast
Comparison: None.

CLINICAL DATA: Fell today.

EXAM:
LEFT KNEE - 1-2 VIEW; RIGHT SHOULDER - 2+ VIEW; RIGHT KNEE - 1-2
VIEW; BILATERAL RIBS AND CHEST - 4+ VIEW; LEFT SHOULDER - 2+ VIEW

[AP (1 of 2)]
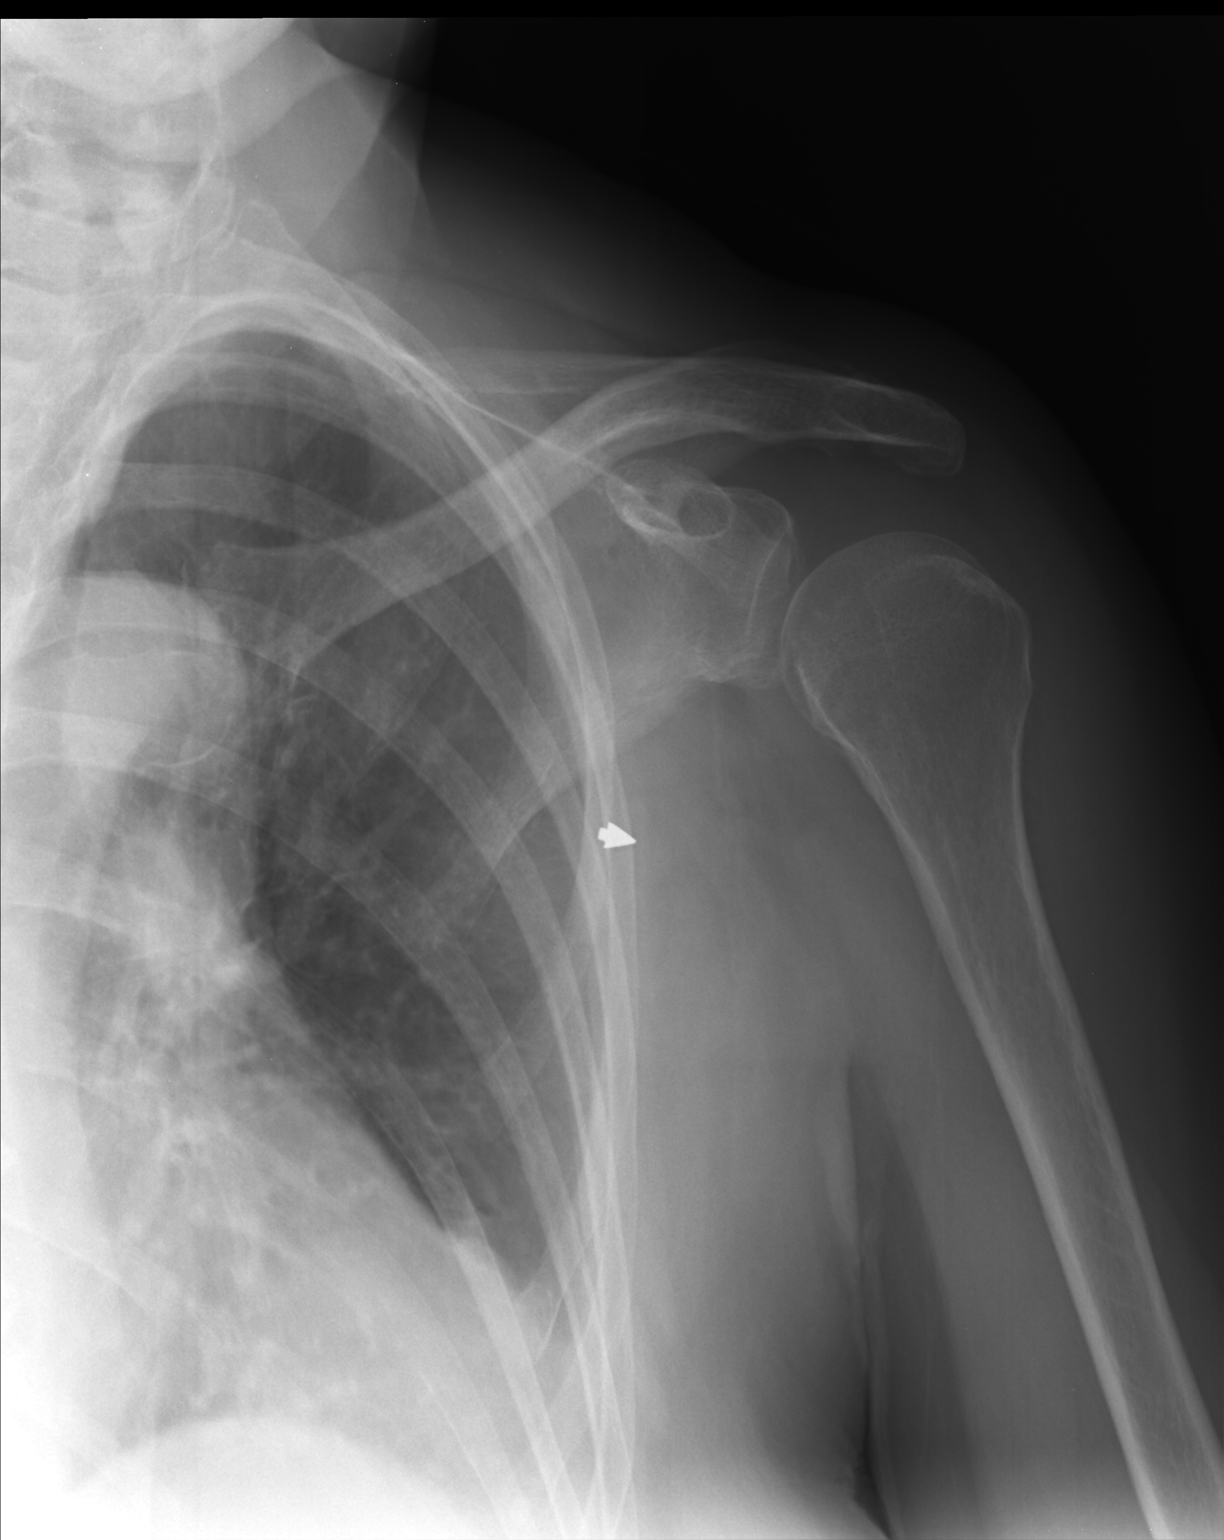

[AP (2 of 2)]
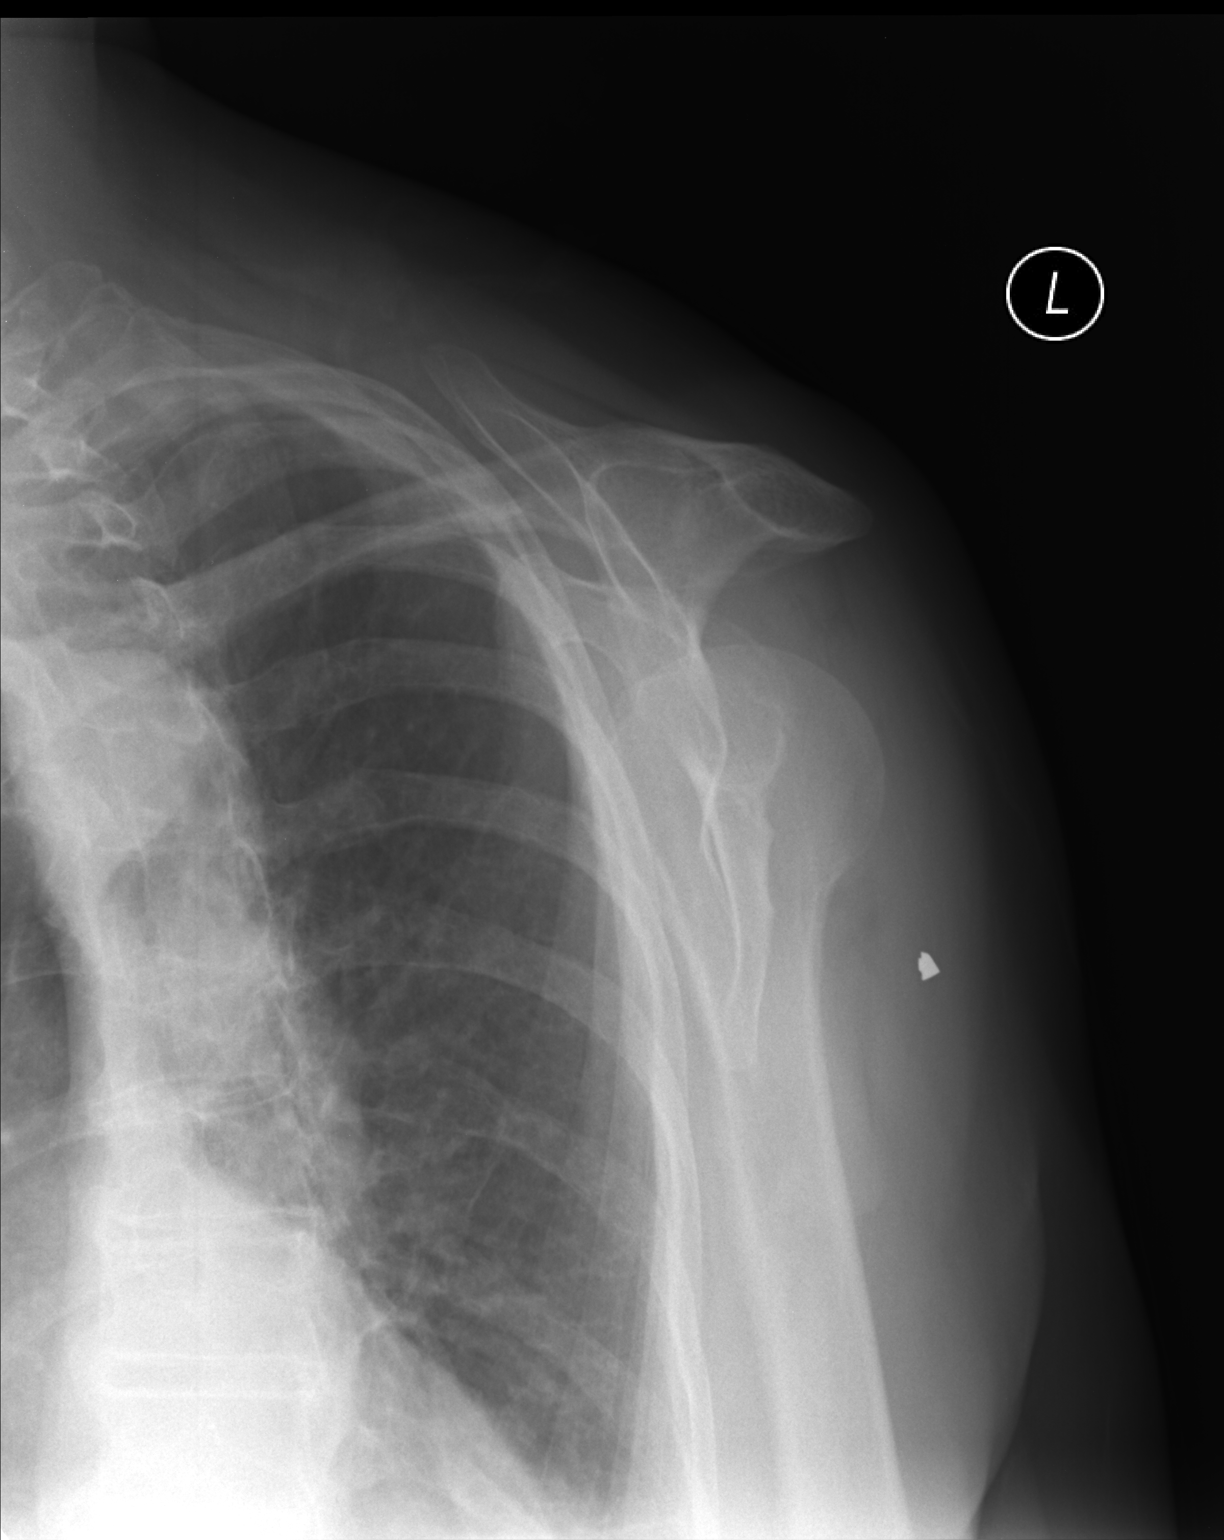

[2 of 2 positions shown; findings below may reference images not displayed]

FINDINGS: Right shoulder:

The joint spaces are maintained. No acute fracture. The visualized
right ribs are intact. The right lung is clear.

Left shoulder:

The joint spaces are maintained. No acute fracture. The visualized
left lung is clear and the visualized left ribs are intact.

Bilateral knees:

Medial compartment degenerative changes bilaterally with joint space
narrowing and early osteophytic spurring. No acute fracture or
osteochondral lesion. No joint effusion.

Chest x-ray and bilateral ribs:

The heart is enlarged but stable. There is tortuosity and
calcification of the thoracic aorta. Chronic bronchitic type lung
changes but no acute pulmonary findings. No pleural effusion. No
pneumothorax.

No definite acute rib fractures are identified. No pleural
thickening.
IMPRESSION: 1. No acute cardiopulmonary findings and no definite acute rib
fractures.
2. No acute bony abnormality involving both shoulders and both
knees.

## 2018-01-12 IMAGING — CR DG KNEE 1-2V*R*
3 series · 3 of 3 positions shown · non-contrast
Comparison: None.

CLINICAL DATA: Fell today.

EXAM:
LEFT KNEE - 1-2 VIEW; RIGHT SHOULDER - 2+ VIEW; RIGHT KNEE - 1-2
VIEW; BILATERAL RIBS AND CHEST - 4+ VIEW; LEFT SHOULDER - 2+ VIEW

[AP]
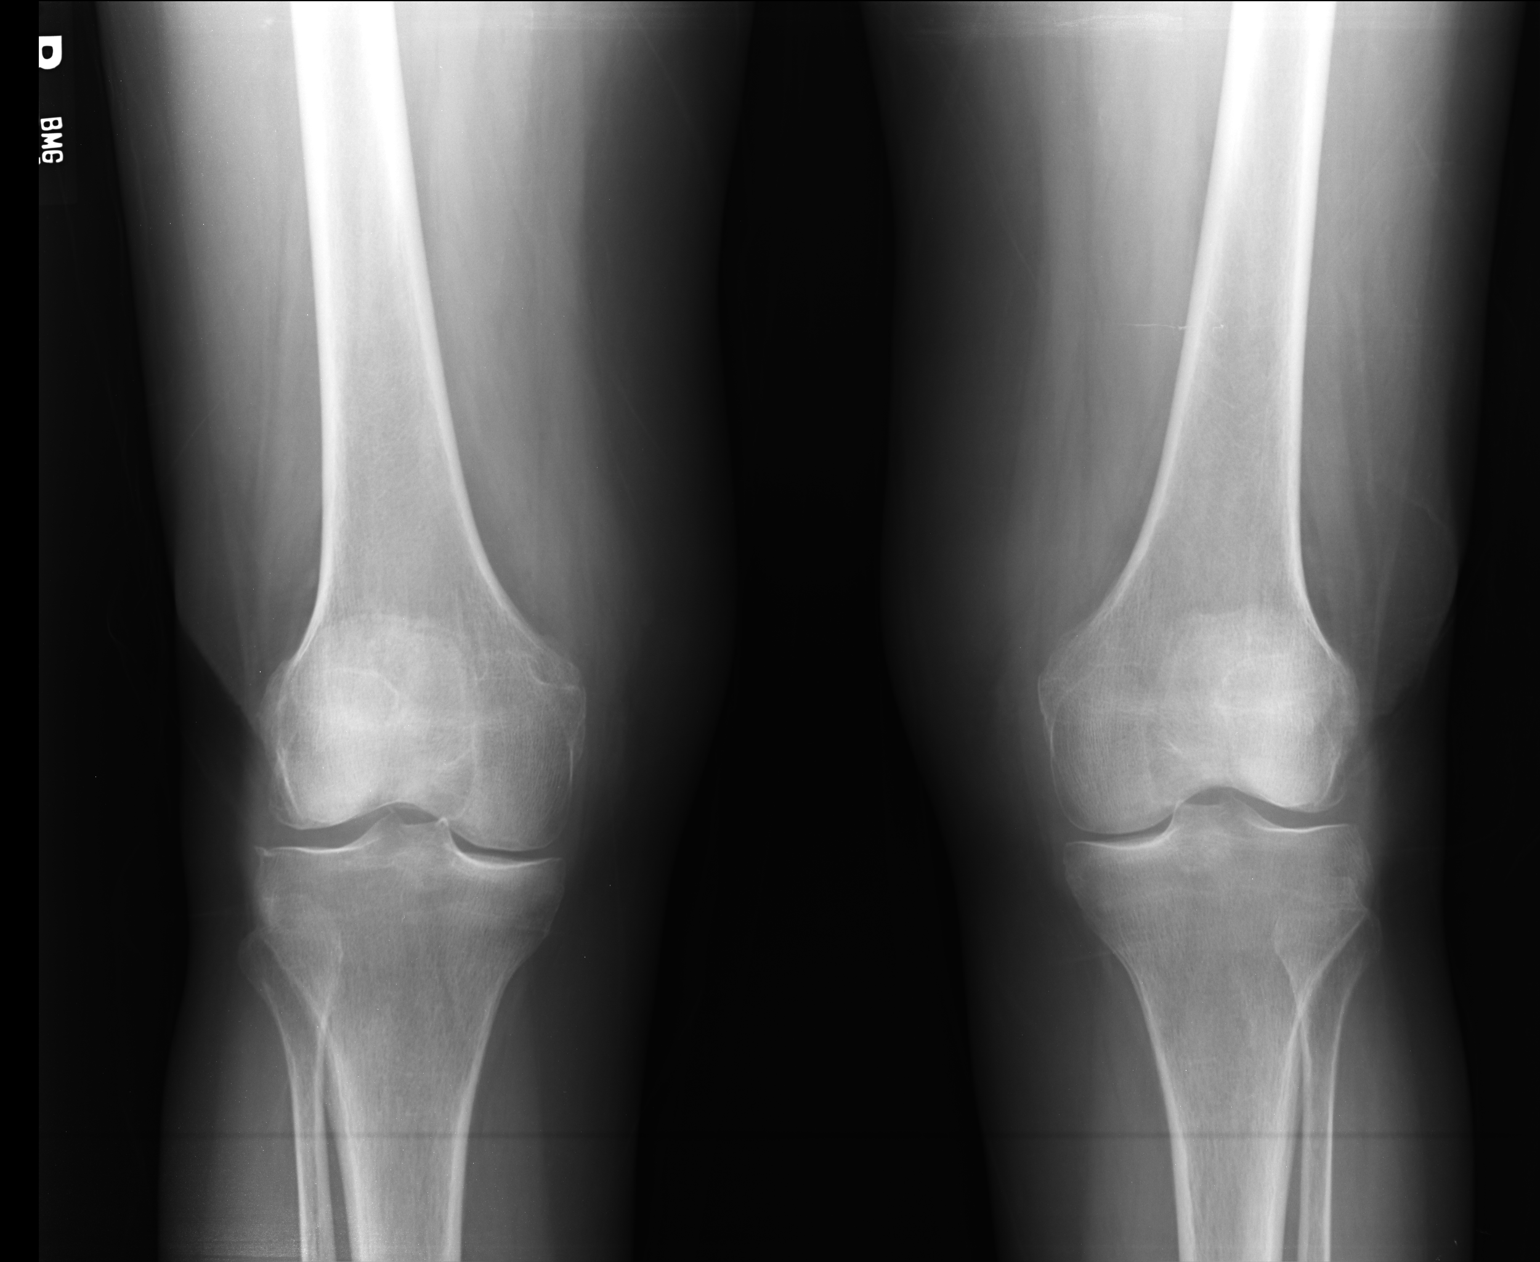

[lateral (1 of 2)]
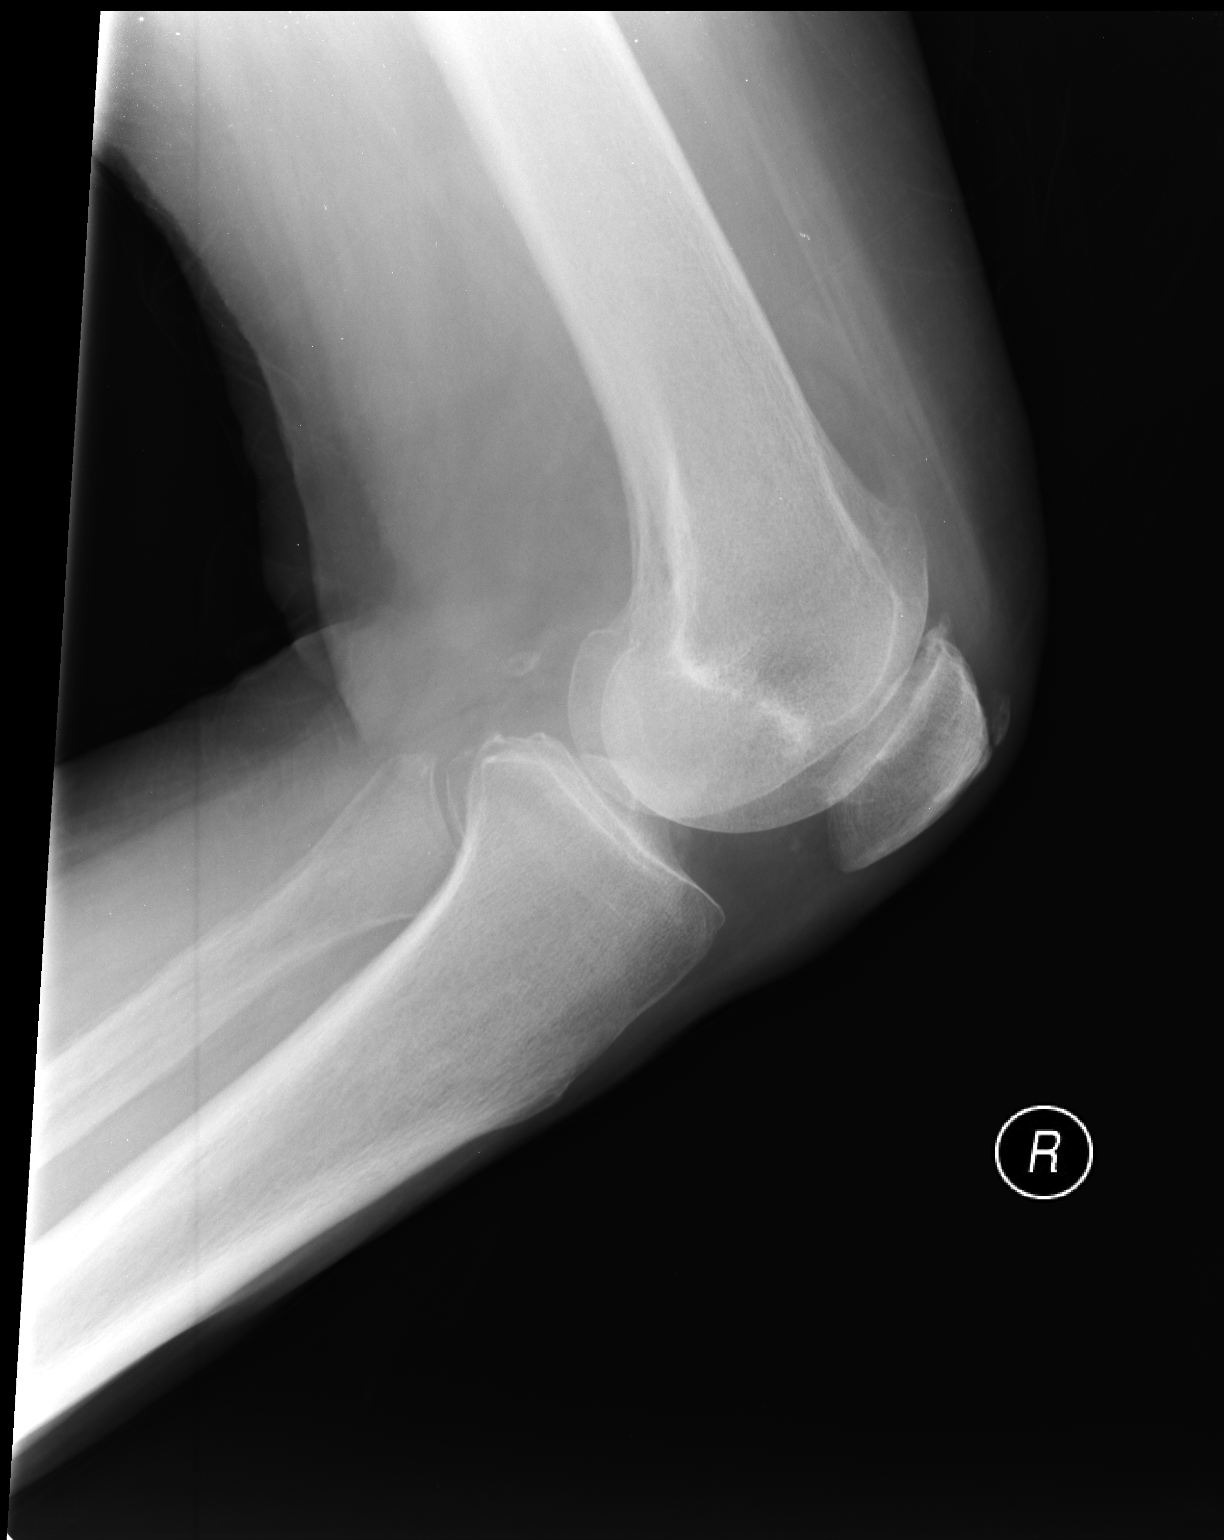

[lateral (2 of 2)]
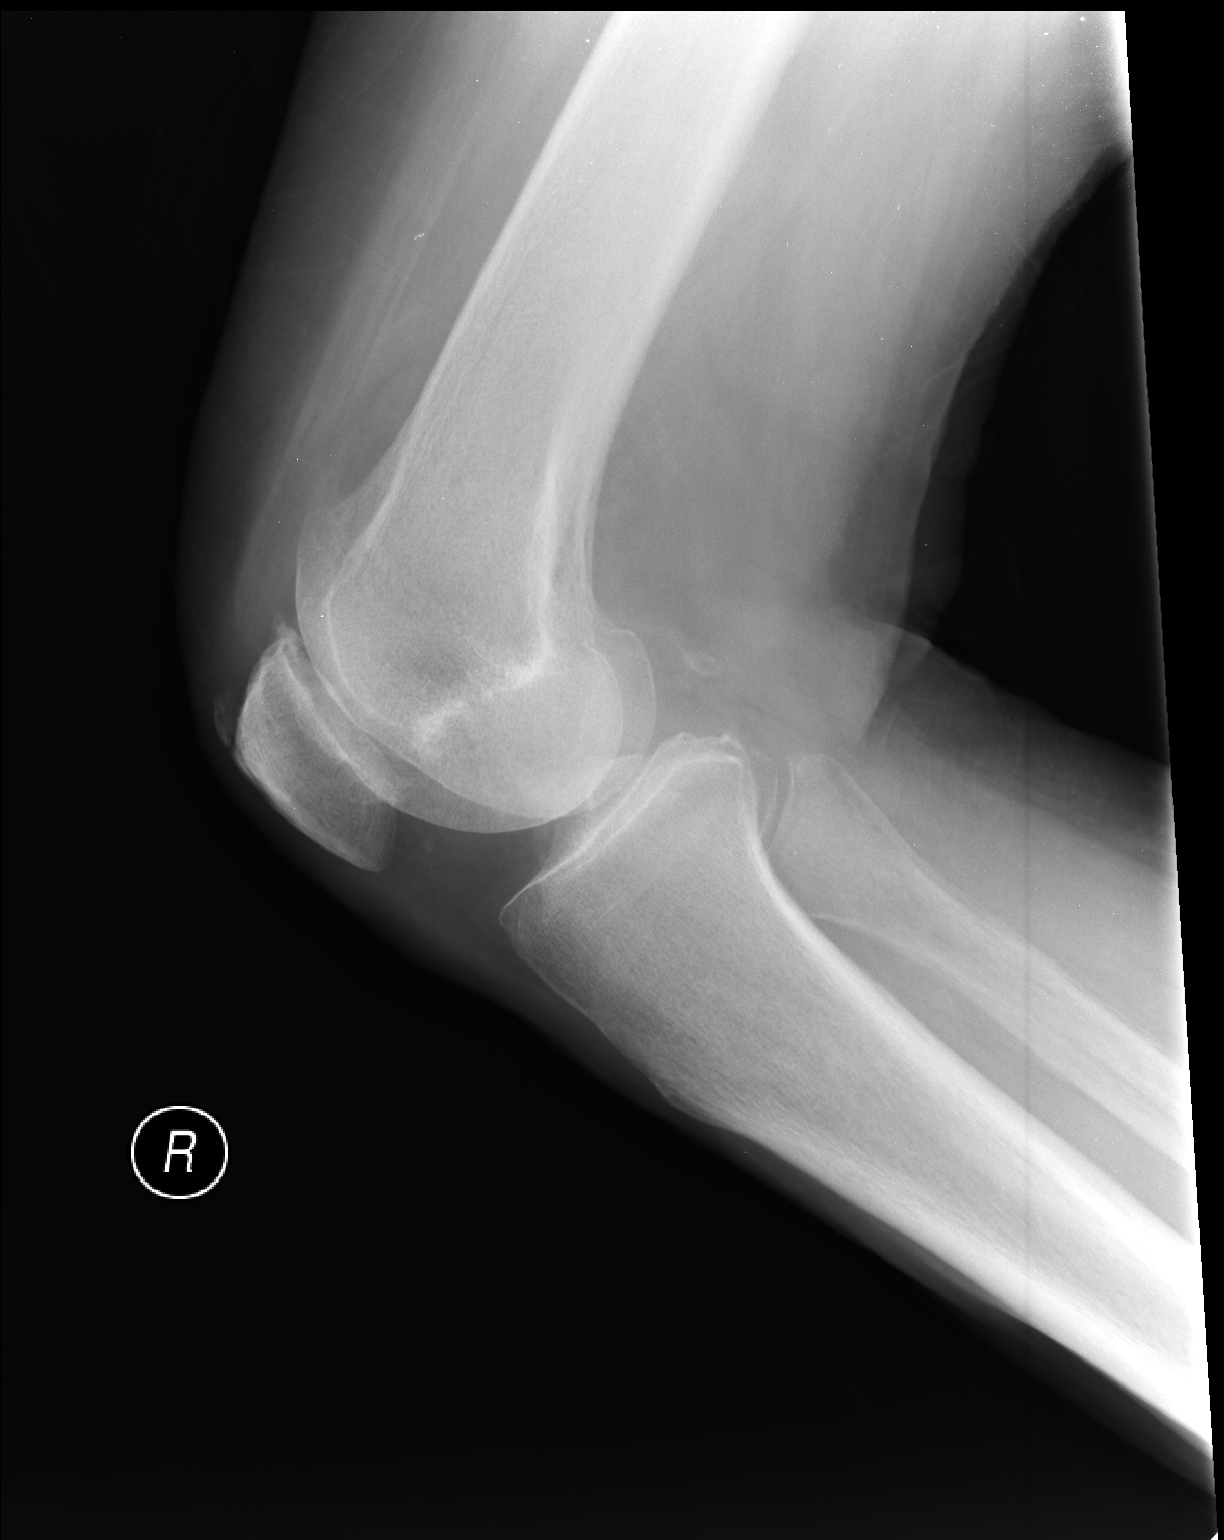

[3 of 3 positions shown; findings below may reference images not displayed]

FINDINGS: Right shoulder:

The joint spaces are maintained. No acute fracture. The visualized
right ribs are intact. The right lung is clear.

Left shoulder:

The joint spaces are maintained. No acute fracture. The visualized
left lung is clear and the visualized left ribs are intact.

Bilateral knees:

Medial compartment degenerative changes bilaterally with joint space
narrowing and early osteophytic spurring. No acute fracture or
osteochondral lesion. No joint effusion.

Chest x-ray and bilateral ribs:

The heart is enlarged but stable. There is tortuosity and
calcification of the thoracic aorta. Chronic bronchitic type lung
changes but no acute pulmonary findings. No pleural effusion. No
pneumothorax.

No definite acute rib fractures are identified. No pleural
thickening.
IMPRESSION: 1. No acute cardiopulmonary findings and no definite acute rib
fractures.
2. No acute bony abnormality involving both shoulders and both
knees.

## 2018-01-12 IMAGING — CR DG SHOULDER 2+V*R*
2 series · 2 of 2 positions shown · non-contrast
Comparison: None.

CLINICAL DATA: Fell today.

EXAM:
LEFT KNEE - 1-2 VIEW; RIGHT SHOULDER - 2+ VIEW; RIGHT KNEE - 1-2
VIEW; BILATERAL RIBS AND CHEST - 4+ VIEW; LEFT SHOULDER - 2+ VIEW

[AP (1 of 2)]
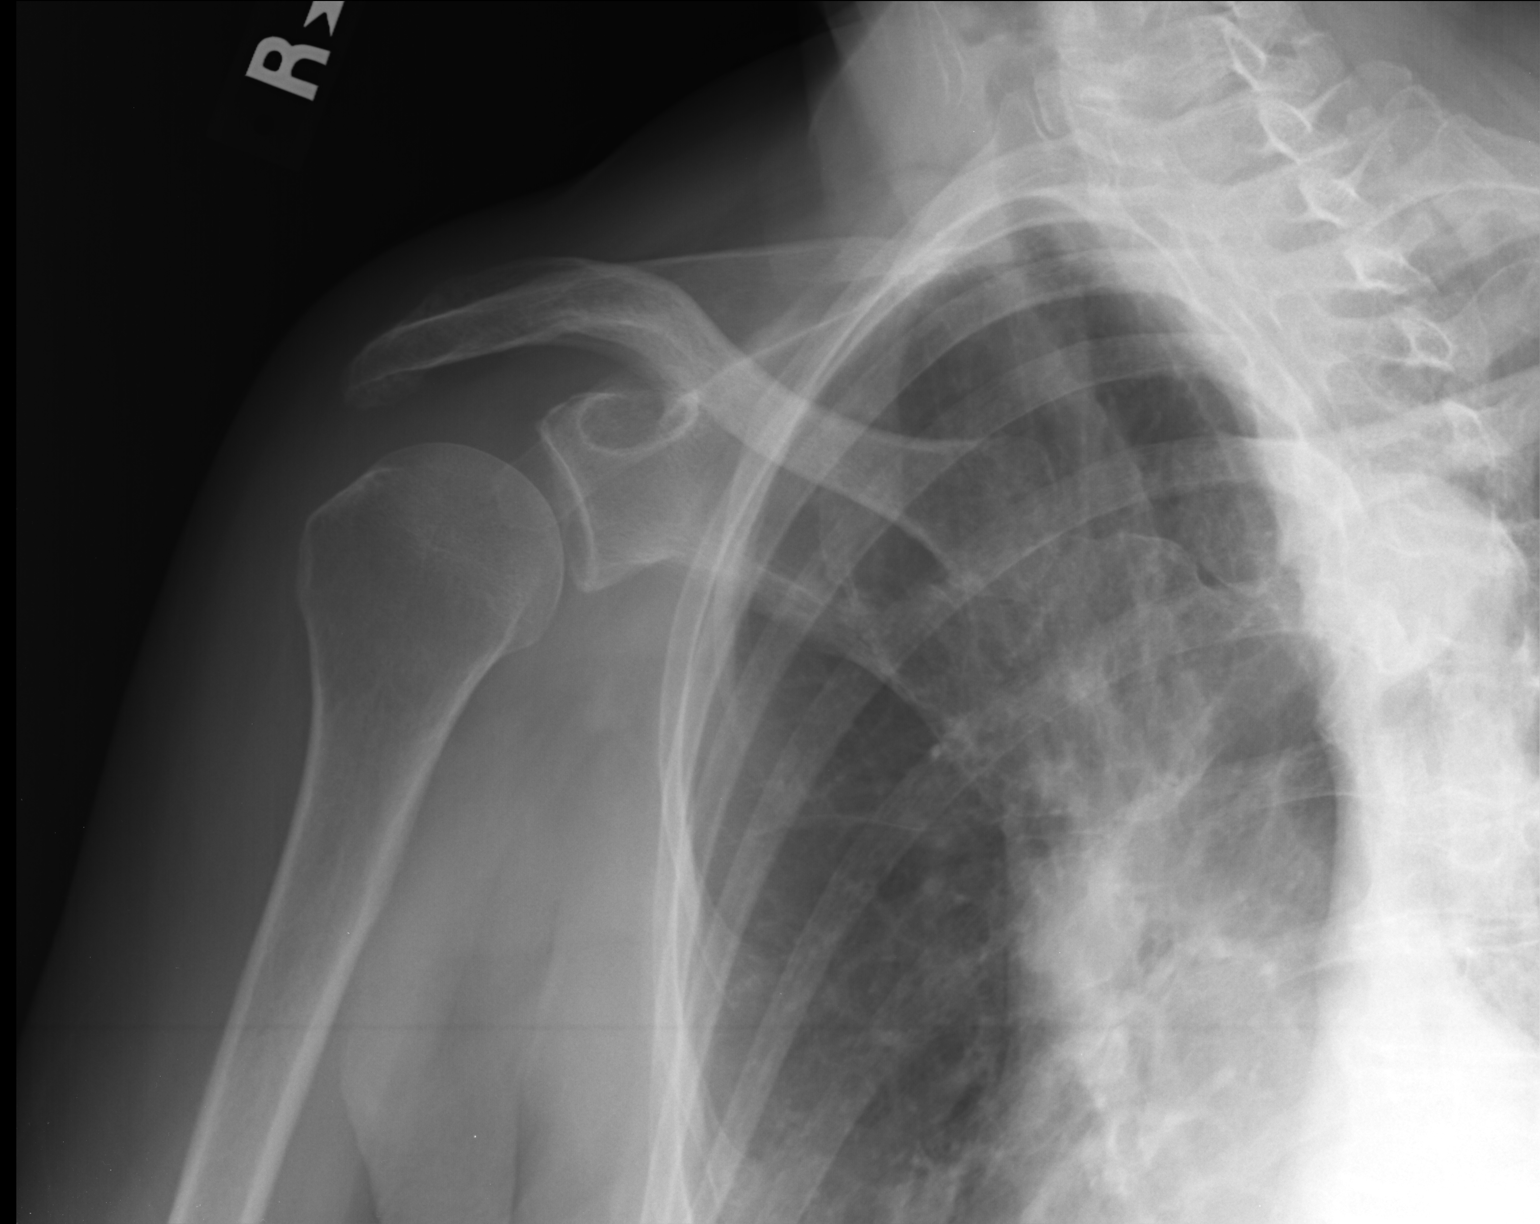

[AP (2 of 2)]
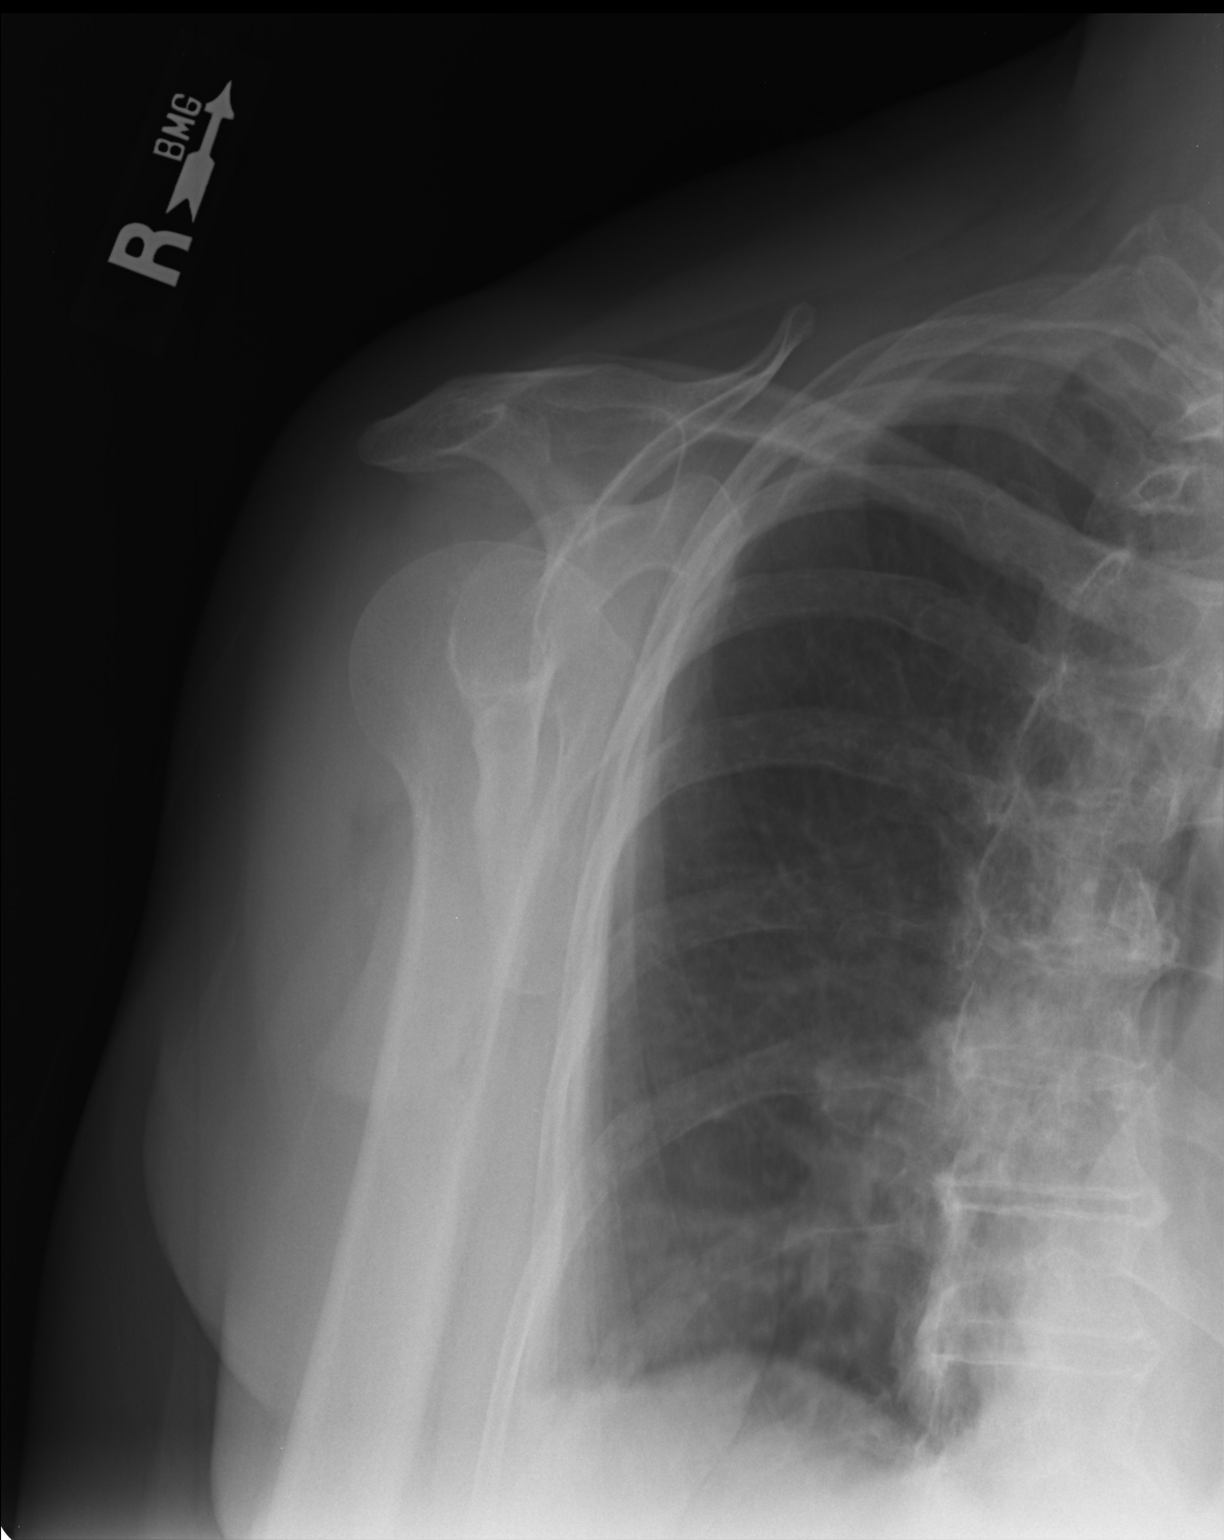

[2 of 2 positions shown; findings below may reference images not displayed]

FINDINGS: Right shoulder:

The joint spaces are maintained. No acute fracture. The visualized
right ribs are intact. The right lung is clear.

Left shoulder:

The joint spaces are maintained. No acute fracture. The visualized
left lung is clear and the visualized left ribs are intact.

Bilateral knees:

Medial compartment degenerative changes bilaterally with joint space
narrowing and early osteophytic spurring. No acute fracture or
osteochondral lesion. No joint effusion.

Chest x-ray and bilateral ribs:

The heart is enlarged but stable. There is tortuosity and
calcification of the thoracic aorta. Chronic bronchitic type lung
changes but no acute pulmonary findings. No pleural effusion. No
pneumothorax.

No definite acute rib fractures are identified. No pleural
thickening.
IMPRESSION: 1. No acute cardiopulmonary findings and no definite acute rib
fractures.
2. No acute bony abnormality involving both shoulders and both
knees.

## 2018-01-15 NOTE — Progress Notes (Signed)
Office Visit Note  Patient: Ariana [name]             Date of Birth: [birth year]           MRN: [id number]             PCP: Gayland Curry, DO Referring: Gayland Curry, DO Visit Date: 01/29/2018 Occupation: @GUAROCC @  Subjective:  Pain in multiple joints    History of Present Illness: Ariana [name] is a 84 y.o. female  with history of Sjogren's, osteoarthritis, and DDD. She is taking Plaquenil 200 mg BID. She continues to have sicca symptoms.  She takes pilocarpine as needed as well as using Biotene products on a regular basis.  She is also using refresh eyedrops twice daily.  Patient is accompanied by her caregiver who provides most of the history.  The caregiver reports that the patient fell at the end of October and has been clean complaining of lower back and right hip pain since.  They have been applying Aspercreme to her lower back.  She states the pain is been waking her up at night.  She says she is also been having increased discomfort in the posterior aspect of bilateral knee joints and her knees have been giving out on her.  She states that she fell due to her knees giving out.  She reports she is also had increased pain in bilateral hands but denies any joint swelling.   Activities of Daily Living:  Patient reports morning stiffness for 20 minutes.   Patient Reports nocturnal pain.  Difficulty dressing/grooming: Denies Difficulty climbing stairs: Reports Difficulty getting out of chair: Reports Difficulty using hands for taps, buttons, cutlery, and/or writing: Denies  Review of Systems  Constitutional: Positive for fatigue.  HENT: Positive for mouth dryness. Negative for mouth sores and nose dryness.   Eyes: Positive for dryness. Negative for pain and visual disturbance.  Respiratory: Negative for cough, hemoptysis, shortness of breath and difficulty breathing.   Cardiovascular: Negative for chest pain, palpitations, hypertension and swelling in legs/feet.    Gastrointestinal: Positive for constipation. Negative for blood in stool and diarrhea.  Endocrine: Negative for increased urination.  Genitourinary: Negative for painful urination.  Musculoskeletal: Positive for arthralgias, joint pain and morning stiffness. Negative for joint swelling, myalgias, muscle weakness, muscle tenderness and myalgias.  Skin: Negative for color change, pallor, rash, hair loss, nodules/bumps, skin tightness, ulcers and sensitivity to sunlight.  Allergic/Immunologic: Negative for susceptible to infections.  Neurological: Negative for dizziness, numbness, headaches and weakness.  Hematological: Negative for swollen glands.  Psychiatric/Behavioral: Negative for depressed mood and sleep disturbance. The patient is not nervous/anxious.     PMFS History:  Patient Active Problem List   Diagnosis Date Noted  . Diabetes mellitus due to underlying condition, uncontrolled ([place]) 09/04/2017  . History of rectal bleeding 09/04/2017  . A-fib ([place]) 04/23/2017  . Diabetes mellitus type 2 in nonobese ([place]) 04/23/2017  . Sjogren's disease ([place]) 04/03/2016  . Primary osteoarthritis of both hands 04/03/2016  . High risk medication use 04/03/2016  . Senile dementia, with behavioral disturbance ([place]) 08/16/2015  . Swelling 08/16/2015  . Diastolic dysfunction 17/61/6073  . Hypertonicity, bladder 07/08/2015  . Arterial hypotension   . Bradycardia 06/29/2015  . Chronic lower back pain 06/29/2015  . PAF (paroxysmal atrial fibrillation) ([place]) 04/18/2015  . Dehydration 04/18/2015  . Dementia without behavioral disturbance ([place]) 04/18/2015  . TIA (transient ischemic attack) 12/17/2014  . Chest pain 12/16/2014  . Acute encephalopathy 12/04/2014  .  Fall   . AKI (acute kidney injury) ([place]) 12/03/2014  . History of cerebrovascular disease 09/24/2014  . Falls frequently 07/28/2014  . Infarction of parietal lobe ([place])   . Mild dementia ([place])   . CVA (cerebral infarction) 07/26/2014  .  Atrial fibrillation with RVR ([place]) 07/03/2014  . Constipation 04/15/2014  . Mixed stress and urge urinary incontinence 11/03/2013  . Therapeutic opioid induced constipation 11/03/2013  . Hemorrhoid 11/03/2013  . Low back pain associated with a spinal disorder other than radiculopathy or spinal stenosis 11/03/2013  . Protein-calorie malnutrition, severe ([place]) 07/22/2013  . UTI (lower urinary tract infection) 07/20/2013  . Prolonged QT interval 07/20/2013  . Osteopenia 07/17/2013  . Palpitations 06/30/2013  . Hereditary and idiopathic peripheral neuropathy 05/22/2013  . Abnormality of gait 12/05/2012  . Headache(784.0) 09/05/2012  . Multinodular thyroid 01/15/2012  . Neck pain 01/15/2012  . Hypercholesterolemia 07/08/2010  . Tear film insufficiency 07/06/2009  . [place] SYNDROME 07/06/2009  . Urge urinary incontinence 01/06/2009  . COLONIC POLYPS, ADENOMATOUS, HX OF 04/15/2008  . MITRAL VALVE PROLAPSE 11/05/2007  . ANXIETY DEPRESSION 07/02/2007  . Mononeuritis 07/02/2007  . HYPERTENSION, BENIGN 07/02/2007  . GERD 07/02/2007  . Irritable bowel syndrome 07/02/2007  . Fibromyalgia 07/02/2007    Past Medical History:  Diagnosis Date  . Abnormality of gait   . Adenomatous polyp of colon 2002   74mm  . Allergic rhinitis   . Anxiety   . Anxiety and depression   . Chronic back pain   . Dementia without behavioral disturbance ([place])   . Depression   . Diverticulosis of colon   . Dry eye syndrome   . Dysphagia   . Dysthymic disorder   . Fibromyalgia   . GERD (gastroesophageal reflux disease)   . H/O hiatal hernia   . History of adverse drug reaction   . History of cerebrovascular disease 09/24/2014  . History of recurrent UTIs   . Hypertension, benign   . Irritable bowel syndrome   . Low back pain syndrome   . Memory loss   . Mitral valve prolapse   . Paroxysmal A-fib ([place])   . Peripheral neuropathy    "both feet and legs"  . Physical deconditioning   . Sjogren's  syndrome ([place])   . Therapeutic opioid-induced constipation ([facility])   . Thyroid nodule   . Urinary incontinence     Family History  Problem Relation Age of Onset  . Heart disease Father        heart attack  . Pneumonia Father   . Heart attack Mother   . Hypertension Mother   . Colon cancer Sister   . Kidney disease Daughter   . Asthma Daughter   . Arthritis Daughter 34       osteo,  . Heart disease Son 63       stage 3 CHF(Diastolic /Systolic)  . Throat cancer Brother   . Hypertension Maternal Grandmother    Past Surgical History:  Procedure Laterality Date  . ABDOMINAL HYSTERECTOMY  1967  . APPENDECTOMY    . CARDIAC CATHETERIZATION  02/17/2003   normal L main, LAD free of disease, Cfx free of disease, RCA free of disease (Dr. Loni Muse. [place])  . CATARACT EXTRACTION, BILATERAL    . CHOLECYSTECTOMY  2000  . COLONOSCOPY W/ BIOPSIES     multiple  . DENTAL SURGERY     multiple tooth extractions  . ESOPHAGOGASTRODUODENOSCOPY (EGD) WITH ESOPHAGEAL DILATION N/A 08/23/2012   Procedure: ESOPHAGOGASTRODUODENOSCOPY (EGD) WITH ESOPHAGEAL DILATION;  Surgeon: Milus Banister, MD;  Location: [facility];  Service: Endoscopy;  Laterality: N/A;  . NASAL SEPTUM SURGERY  1980  . NM MYOCAR PERF WALL MOTION  2003   persantine - normal static and dynamic study w/apical thinning and presvered LV function, no ischemia  . SINUS EXPLORATION     ossifiying fibroma  . TEMPOROMANDIBULAR JOINT SURGERY  1986   Dr. Terence Lux  . 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    Patient is Widowed.    Patient has 2 children.    Patient is retired.    Former smoker   Alcohol none   Exercise Walk, exercise chair 4 days a week   POA    Walks with cane      Patient drinks about 1-2 cups of hot tea daily.   Patient is right handed.               Immunization History  Administered Date(s)  Administered  . Influenza Split 12/26/2010, 01/15/2012  . Influenza Whole 12/30/2008, 11/15/2009  . Influenza, High Dose Seasonal PF 12/21/2016, 11/05/2017  . Influenza,inj,Quad PF,6+ Mos 02/04/2013, 11/03/2013, 12/05/2014, 12/13/2015  . Pneumococcal Conjugate-13 06/12/2014  . Pneumococcal Polysaccharide-23 02/15/2009, 12/05/2014  . Tdap 12/08/2013  . Zoster 01/29/2012    Objective: Vital Signs: BP 125/68 (BP Location: Left Arm, Patient Position: Sitting, Cuff Size: Normal)   Pulse 69   Resp 13   Ht 5' 3.5" (1.613 m)   Wt 147 lb (66.7 kg)   BMI 25.63 kg/m    Physical Exam Vitals signs and nursing note reviewed.  Constitutional:      Appearance: She is well-developed.  HENT:     Head: Normocephalic and atraumatic.  Eyes:     Conjunctiva/sclera: Conjunctivae normal.  Neck:     Musculoskeletal: Normal range of motion.  Cardiovascular:     Rate and Rhythm: Normal rate and regular rhythm.     Heart sounds: Normal heart sounds.  Pulmonary:     Effort: Pulmonary effort is normal.     Breath sounds: Normal breath sounds.  Abdominal:     General: Bowel sounds are normal.     Palpations: Abdomen is soft.  Lymphadenopathy:     Cervical: No cervical adenopathy.  Skin:    General: Skin is warm and dry.     Capillary Refill: Capillary refill takes less than 2 seconds.  Neurological:     Mental Status: She is alert and oriented to person, place, and time.  Psychiatric:        Behavior: Behavior normal.      Musculoskeletal Exam: C-spine good ROM.  Thoracic kyphosis.  Limited ROM of lumbar spine with discomfort.  Midline spinal tenderness in the lumbar region.  Shoulder joints, elbow joints, wrist joints, MCPs, PIPs, and DIPs good ROM with no synovitis.  Complete fist formation bilaterally.  Hip joints good ROM.  Right hip discomfort with ROM.  No tenderness of trochanteric bursa bilaterally.  Knee joints and ankle joints good ROM.  No warmth or effusion of knee joints.   CDAI  Exam: CDAI Score: Not documented Patient Global Assessment: Not documented; Provider Global Assessment: Not documented Swollen: Not documented; Tender: Not documented Joint Exam   Not documented   There is currently no information documented on the homunculus. Go to the Rheumatology activity and complete the homunculus joint exam.  Investigation: No additional findings.  Imaging: Xr Lumbar Spine 2-3 Views  Result Date: 01/29/2018 No significant disc space narrowing was noted.  Scoliosis was noted.  Facet joint arthropathy was noted. Patient: These findings are consistent with facet joint arthropathy.   Recent Labs: Lab Results  Component Value Date   WBC 5.8 09/04/2017   HGB 12.6 09/04/2017   PLT 196 09/04/2017   NA 140 09/04/2017   K 4.3 09/04/2017   CL 100 09/04/2017   CO2 29 09/04/2017   GLUCOSE 80 09/04/2017   BUN 20 09/04/2017   CREATININE 0.84 09/04/2017   BILITOT 0.5 04/29/2017   ALKPHOS 71 04/29/2017   AST 27 04/29/2017   ALT 26 04/29/2017   PROT 6.6 04/29/2017   ALBUMIN 3.6 04/29/2017   CALCIUM 9.7 09/04/2017   GFRAA 73 09/04/2017    Speciality Comments: PLQ eye exam: 10/10/2017 Normal. Dr. Prudencio Burly. Follow up in 12 months.  Procedures:  No procedures performed Allergies: Banana; Codeine; Klonopin [clonazepam]; Meperidine hcl; Norflex [orphenadrine citrate]; Oxycodone-acetaminophen; Propoxyphene hcl; Zoloft [sertraline hcl]; Doxycycline; Naproxen; Penicillins; Phenothiazines; Stelazine; Sulfamethoxazole-trimethoprim; Tolectin [tolmetin sodium]; and Tramadol   Assessment / Plan:     Visit Diagnoses: Sjogren's syndrome with other organ involvement ([place]) - ANA+, Ro-, La -She continues to have sicca symptoms. She continues to use pilocarpine 5 mg TID PRN.  She uses biotene products and refresh eye drops.  She has no parotid swelling on exam.  She will continue on plaquenil 200 mg BID.  She does not need a refill at this time.  We will check CBC, CMP, and UA today.-  Plan: Urinalysis, Routine w reflex microscopic  High risk medication use - PLQ 200 mg by mouth daily.eye exam: 10/10/2017. CBC and CMP were drawn today.  Plan: CBC with Differential/Platelet, COMPLETE METABOLIC PANEL WITH GFR, CBC with Differential/Platelet, COMPLETE METABOLIC PANEL WITH GFR  Primary osteoarthritis of both hands: She has mild PIP and DIP synovial thickening consistent with osteoarthritis pain in hands.  She has bilateral CMC joint synovial thickening.  She has complete fist formation bilaterally.  No synovitis noted.  Joint protection and muscle strengthening were discussed.   Primary osteoarthritis of both feet: She has discomfort in both feet due to neuropathy.   Trochanteric bursitis of right hip: She has no tenderness on exam today.  She has full range of motion of the right hip joint with some discomfort.  DDD (degenerative disc disease), lumbar: Patient fell at the end of October and has had lower back pain since.  She did not have an x-ray following the fall.  Her last x-ray of the lumbar spine was on 03/08/2017 which revealed spondylosis of the lumbar spine. L5-S1 narrowing.  Facet joint arthropathy noted.  She is limited range of motion with discomfort.  She has midline spinal tenderness in the lumbar region.  The x-ray of the lumbar spine today did not show any vertebral fracture.  The findings were consistent with facet joint arthropathy.  Osteopenia of multiple sites: DEXA on 04/05/2015. AP spine T-score -1.5.    Hereditary and idiopathic peripheral neuropathy -She was advised to follow-up with her neurologist.  Other medical conditions are listed as follows:  History of atrial fibrillation  History of hypercholesterolemia  History of TIA (transient ischemic attack)  History of hypertension  History of gastroesophageal reflux (GERD)  History of memory loss  History of insomnia   Orders: Orders Placed This Encounter  Procedures  . XR Lumbar Spine 2-3 Views    . CBC with  Differential/Platelet  . COMPLETE METABOLIC PANEL WITH GFR  . CBC with Differential/Platelet  . COMPLETE METABOLIC PANEL WITH GFR  . Urinalysis, Routine w reflex microscopic   No orders of the defined types were placed in this encounter.   Face-to-face time spent with patient was 30 minutes. Greater than 50% of time was spent in counseling and coordination of care.  Follow-Up Instructions: Return in about 1 year (around 01/30/2019) for Sjogren's, OA, DDD.   Hazel Sams PA-C   I examined and evaluated the patient with Hazel Sams PA.  Patient complains of lower back pain and knee joint discomfort.  She does have underlying osteoarthritis.  She had no synovitis or inflammation on my examination.  The x-ray of the lumbar spine revealed facet joint arthropathy which was unchanged.  There were no vertebral fractures.  The plan of care was discussed as noted above.  Bo Merino, MD  Note - This record has been created using Editor, commissioning.  Chart creation errors have been sought, but may not always  have been located. Such creation errors do not reflect on  the standard of medical care.

## 2018-01-22 ENCOUNTER — Other Ambulatory Visit: Payer: Self-pay | Admitting: *Deleted

## 2018-01-22 ENCOUNTER — Other Ambulatory Visit: Payer: Self-pay | Admitting: Internal Medicine

## 2018-01-22 MED ORDER — ALPRAZOLAM 0.25 MG PO TABS: 0.2500 mg | ORAL_TABLET | Freq: Two times a day (BID) | ORAL | 0 refills | Status: DC | PRN

## 2018-01-22 NOTE — Telephone Encounter (Signed)
Patient daughter called requesting refill [place] Verified LR: 12/21/2017 Phoned Rx to pharmacy.

## 2018-01-24 DIAGNOSIS — N3942 Incontinence without sensory awareness: Secondary | ICD-10-CM | POA: Diagnosis not present

## 2018-01-24 DIAGNOSIS — R3915 Urgency of urination: Secondary | ICD-10-CM | POA: Diagnosis not present

## 2018-01-24 DIAGNOSIS — B373 Candidiasis of vulva and vagina: Secondary | ICD-10-CM | POA: Diagnosis not present

## 2018-01-29 ENCOUNTER — Ambulatory Visit (INDEPENDENT_AMBULATORY_CARE_PROVIDER_SITE_OTHER): Payer: Self-pay

## 2018-01-29 ENCOUNTER — Encounter: Payer: Self-pay | Admitting: Rheumatology

## 2018-01-29 ENCOUNTER — Ambulatory Visit (INDEPENDENT_AMBULATORY_CARE_PROVIDER_SITE_OTHER): Payer: Medicare Other | Admitting: Rheumatology

## 2018-01-29 VITALS — BP 125/68 | HR 69 | Resp 13 | Ht 63.5 in | Wt 147.0 lb

## 2018-01-29 DIAGNOSIS — M7061 Trochanteric bursitis, right hip: Secondary | ICD-10-CM

## 2018-01-29 DIAGNOSIS — Z79899 Other long term (current) drug therapy: Secondary | ICD-10-CM

## 2018-01-29 DIAGNOSIS — M3509 Sicca syndrome with other organ involvement: Secondary | ICD-10-CM

## 2018-01-29 DIAGNOSIS — Z8673 Personal history of transient ischemic attack (TIA), and cerebral infarction without residual deficits: Secondary | ICD-10-CM

## 2018-01-29 DIAGNOSIS — Z8679 Personal history of other diseases of the circulatory system: Secondary | ICD-10-CM

## 2018-01-29 DIAGNOSIS — M19071 Primary osteoarthritis, right ankle and foot: Secondary | ICD-10-CM

## 2018-01-29 DIAGNOSIS — M5136 Other intervertebral disc degeneration, lumbar region: Secondary | ICD-10-CM

## 2018-01-29 DIAGNOSIS — M19072 Primary osteoarthritis, left ankle and foot: Secondary | ICD-10-CM

## 2018-01-29 DIAGNOSIS — Z8719 Personal history of other diseases of the digestive system: Secondary | ICD-10-CM

## 2018-01-29 DIAGNOSIS — M545 Low back pain, unspecified: Secondary | ICD-10-CM

## 2018-01-29 DIAGNOSIS — G609 Hereditary and idiopathic neuropathy, unspecified: Secondary | ICD-10-CM

## 2018-01-29 DIAGNOSIS — Z8639 Personal history of other endocrine, nutritional and metabolic disease: Secondary | ICD-10-CM

## 2018-01-29 DIAGNOSIS — M19041 Primary osteoarthritis, right hand: Secondary | ICD-10-CM

## 2018-01-29 DIAGNOSIS — M8589 Other specified disorders of bone density and structure, multiple sites: Secondary | ICD-10-CM

## 2018-01-29 DIAGNOSIS — M51369 Other intervertebral disc degeneration, lumbar region without mention of lumbar back pain or lower extremity pain: Secondary | ICD-10-CM

## 2018-01-29 DIAGNOSIS — Z87898 Personal history of other specified conditions: Secondary | ICD-10-CM

## 2018-01-29 DIAGNOSIS — G8929 Other chronic pain: Secondary | ICD-10-CM

## 2018-01-29 DIAGNOSIS — M19042 Primary osteoarthritis, left hand: Secondary | ICD-10-CM

## 2018-01-29 NOTE — Patient Instructions (Signed)
Standing Labs We placed an order today for your standing lab work.    Please come back and get your standing labs in 5 months  We have open lab Monday through Friday from 8:30-11:30 AM and 1:30-4:00 PM  at the office of Dr. Bo Merino.   You may experience shorter wait times on Monday and Friday afternoons. The office is located at [address] [place], [place], [place] [address] No appointment is necessary.   Labs are drawn by [facility].  You may receive a bill from [facility] for your lab work.  If you wish to have your labs drawn at another location, please call the office 24 hours in advance to send orders.  If you have any questions regarding directions or hours of operation,  please call [phone].   Just as a reminder please drink plenty of water prior to coming for your lab work. Thanks!

## 2018-01-30 LAB — COMPLETE METABOLIC PANEL WITH GFR
AG Ratio: 1.6 (calc) (ref 1.0–2.5)
ALT: 13 U/L (ref 6–29)
AST: 21 U/L (ref 10–35)
Albumin: 4.5 g/dL (ref 3.6–5.1)
Alkaline phosphatase (APISO): 72 U/L (ref 33–130)
BUN/Creatinine Ratio: 26 (calc) — ABNORMAL HIGH (ref 6–22)
BUN: 27 mg/dL — ABNORMAL HIGH (ref 7–25)
CO2: 28 mmol/L (ref 20–32)
CREATININE: 1.03 mg/dL — AB (ref 0.60–0.88)
Calcium: 10.1 mg/dL (ref 8.6–10.4)
Chloride: 102 mmol/L (ref 98–110)
GFR, EST AFRICAN AMERICAN: 57 mL/min/{1.73_m2} — AB (ref >=60)
GFR, Est Non African American: 49 mL/min/{1.73_m2} — ABNORMAL LOW (ref >=60)
Globulin: 2.8 g/dL (calc) (ref 1.9–3.7)
Glucose, Bld: 84 mg/dL (ref 65–99)
Potassium: 4.2 mmol/L (ref 3.5–5.3)
Sodium: 140 mmol/L (ref 135–146)
Total Bilirubin: 0.3 mg/dL (ref 0.2–1.2)
Total Protein: 7.3 g/dL (ref 6.1–8.1)

## 2018-01-30 LAB — CBC WITH DIFFERENTIAL/PLATELET
Absolute Monocytes: 737 cells/uL (ref 200–950)
Basophils Absolute: 30 cells/uL (ref 0–200)
Basophils Relative: 0.4 %
Eosinophils Absolute: 190 cells/uL (ref 15–500)
Eosinophils Relative: 2.5 %
HCT: 38.8 % (ref 35.0–45.0)
HEMOGLOBIN: 12.6 g/dL (ref 11.7–15.5)
Lymphs Abs: 2204 cells/uL (ref 850–3900)
MCH: 30.1 pg (ref 27.0–33.0)
MCHC: 32.5 g/dL (ref 32.0–36.0)
MCV: 92.8 fL (ref 80.0–100.0)
MPV: 11.3 fL (ref 7.5–12.5)
Monocytes Relative: 9.7 %
NEUTROS ABS: 4438 {cells}/uL (ref 1500–7800)
Neutrophils Relative %: 58.4 %
Platelets: 225 10*3/uL (ref 140–400)
RBC: 4.18 10*6/uL (ref 3.80–5.10)
RDW: 12.3 % (ref 11.0–15.0)
Total Lymphocyte: 29 %
WBC: 7.6 10*3/uL (ref 3.8–10.8)

## 2018-01-30 LAB — URINALYSIS, ROUTINE W REFLEX MICROSCOPIC
Bilirubin Urine: NEGATIVE
Glucose, UA: NEGATIVE
Hgb urine dipstick: NEGATIVE
Nitrite: NEGATIVE
Specific Gravity, Urine: 1.028 (ref 1.001–1.03)
pH: <=5 (ref 5.0–8.0)

## 2018-01-30 NOTE — Progress Notes (Signed)
CBC WNL. Creatinine is elevated and GFR is low. Please advise patient to avoid NSAIDs.  UA revealed findings consistent with a UTI. Please notify patient and forward lab results to PCP.

## 2018-01-31 ENCOUNTER — Ambulatory Visit: Payer: Medicare Other | Admitting: Rheumatology

## 2018-02-02 ENCOUNTER — Other Ambulatory Visit: Payer: Self-pay | Admitting: Internal Medicine

## 2018-02-02 DIAGNOSIS — F341 Dysthymic disorder: Secondary | ICD-10-CM

## 2018-02-02 DIAGNOSIS — M797 Fibromyalgia: Secondary | ICD-10-CM

## 2018-02-08 ENCOUNTER — Other Ambulatory Visit: Payer: Self-pay | Admitting: Internal Medicine

## 2018-02-08 DIAGNOSIS — E119 Type 2 diabetes mellitus without complications: Secondary | ICD-10-CM

## 2018-02-11 ENCOUNTER — Other Ambulatory Visit: Payer: Self-pay

## 2018-02-11 MED ORDER — FUROSEMIDE 40 MG PO TABS: 40.0000 mg | ORAL_TABLET | Freq: Every day | ORAL | 1 refills | Status: DC

## 2018-02-11 NOTE — Telephone Encounter (Signed)
Patient's daughter called and stated patient was out of Furosemide 40 mg tab. They had tried to call into pharmacy Friday. Requesting refill. Review medication list, next appointment, and sent refill to pharmacy. Contacted daughter and let them know medication has been sent to pharmacy.

## 2018-02-15 DIAGNOSIS — R300 Dysuria: Secondary | ICD-10-CM | POA: Diagnosis not present

## 2018-02-15 DIAGNOSIS — R3915 Urgency of urination: Secondary | ICD-10-CM | POA: Diagnosis not present

## 2018-02-21 ENCOUNTER — Other Ambulatory Visit: Payer: Self-pay | Admitting: *Deleted

## 2018-02-21 MED ORDER — ALPRAZOLAM 0.25 MG PO TABS: 0.2500 mg | ORAL_TABLET | Freq: Two times a day (BID) | ORAL | 0 refills | Status: DC | PRN

## 2018-02-21 NOTE — Telephone Encounter (Signed)
Patient daughter requested. [place] Verified LR: 01/22/18 Phoned Rx to pharmacy.

## 2018-02-28 ENCOUNTER — Ambulatory Visit: Payer: Medicare Other | Admitting: Internal Medicine

## 2018-03-01 ENCOUNTER — Other Ambulatory Visit: Payer: Self-pay | Admitting: Rheumatology

## 2018-03-01 NOTE — Telephone Encounter (Signed)
Last Visit: 01/29/18 Next visit: 01/30/19 Labs: 01/29/18 CBC WNL. Creatinine is elevated and GFR is low PLQ eye exam: 10/10/2017 Normal.   Okay to refill per Dr. Estanislado Pandy

## 2018-03-07 ENCOUNTER — Encounter: Payer: Self-pay | Admitting: Internal Medicine

## 2018-03-07 ENCOUNTER — Ambulatory Visit (INDEPENDENT_AMBULATORY_CARE_PROVIDER_SITE_OTHER): Payer: Medicare Other | Admitting: Internal Medicine

## 2018-03-07 VITALS — BP 120/68 | HR 68 | Temp 97.9°F | Ht 63.5 in | Wt 143.0 lb

## 2018-03-07 DIAGNOSIS — N3281 Overactive bladder: Secondary | ICD-10-CM

## 2018-03-07 DIAGNOSIS — E0865 Diabetes mellitus due to underlying condition with hyperglycemia: Secondary | ICD-10-CM

## 2018-03-07 DIAGNOSIS — K5909 Other constipation: Secondary | ICD-10-CM

## 2018-03-07 DIAGNOSIS — K2100 Gastro-esophageal reflux disease with esophagitis, without bleeding: Secondary | ICD-10-CM

## 2018-03-07 DIAGNOSIS — E041 Nontoxic single thyroid nodule: Secondary | ICD-10-CM | POA: Diagnosis not present

## 2018-03-07 DIAGNOSIS — F0150 Vascular dementia without behavioral disturbance: Secondary | ICD-10-CM

## 2018-03-07 DIAGNOSIS — N3000 Acute cystitis without hematuria: Secondary | ICD-10-CM | POA: Diagnosis not present

## 2018-03-07 MED ORDER — LANSOPRAZOLE 30 MG PO CPDR: 30.0000 mg | DELAYED_RELEASE_CAPSULE | Freq: Every day | ORAL | 1 refills | Status: DC

## 2018-03-07 NOTE — Patient Instructions (Addendum)
Start back on miralax.    You will hear from our referral coordinator or [facility] to set up the ultrasound of your thyroid.

## 2018-03-07 NOTE — Progress Notes (Signed)
Location:  [facility] clinic Provider:  Latrisha Coiro L. Mariea [name], D.O., C.M.D.  Code Status: DNR Goals of Care:  Advanced Directives 11/05/2017  Does Patient Have a Medical Advance Directive? Yes  Type of Advance Directive [place]  Does patient want to make changes to medical advance directive? No - Patient declined  Copy of [place] in Chart? Yes  Would patient like information on creating a medical advance directive? -  Pre-existing out of facility DNR order (yellow form or pink MOST form) -     Chief Complaint  Patient presents with  . Medical Management of Chronic Issues    51mth follow-up    HPI: Patient is a 85 y.o. female seen today for medical management of chronic diseases.    She had a UTI and was treated for an infection.  It's not completely better.  She has completed the 7 days of cipro.  Had [place] of bladder, too.  She still had to get up 5 times overnight.    She was having to have a bm right after urinating each time.  then her bowels got hard.  She ran out of miralax and only on her two laxatives.  She was having a lot of formed stools, but soft again this am.  She sometimes has 4 bms in one day.    Her cardiology appt has been rescheduled to 2/5 with Ariana [name].  Her caregiver had been out when her child was sick so pt could not get to prior appt.    Past Medical History:  Diagnosis Date  . Abnormality of gait   . Adenomatous polyp of colon 2002   25mm  . Allergic rhinitis   . Anxiety   . Anxiety and depression   . Chronic back pain   . Dementia without behavioral disturbance ([employer])   . Depression   . Diverticulosis of colon   . Dry eye syndrome   . Dysphagia   . Dysthymic disorder   . Fibromyalgia   . GERD (gastroesophageal reflux disease)   . H/O hiatal hernia   . History of adverse drug reaction   . History of cerebrovascular disease 09/24/2014  . History of recurrent UTIs   . Hypertension, benign   . Irritable bowel syndrome    . Low back pain syndrome   . Memory loss   . Mitral valve prolapse   . Paroxysmal A-fib ([place])   . Peripheral neuropathy    "both feet and legs"  . Physical deconditioning   . Sjogren's syndrome ([place])   . Therapeutic opioid-induced constipation (OIC)   . Thyroid nodule   . Urinary incontinence     Past Surgical History:  Procedure Laterality Date  . ABDOMINAL HYSTERECTOMY  1967  . APPENDECTOMY    . CARDIAC CATHETERIZATION  02/17/2003   normal L main, LAD free of disease, Cfx free of disease, RCA free of disease (Dr. Loni Muse. Little)  . CATARACT EXTRACTION, BILATERAL    . CHOLECYSTECTOMY  2000  . COLONOSCOPY W/ BIOPSIES     multiple  . DENTAL SURGERY     multiple tooth extractions  . ESOPHAGOGASTRODUODENOSCOPY (EGD) WITH ESOPHAGEAL DILATION N/A 08/23/2012   Procedure: ESOPHAGOGASTRODUODENOSCOPY (EGD) WITH ESOPHAGEAL DILATION;  Surgeon: Milus Banister, MD;  Location: [facility] END[facility]COPY;  Service: Endoscopy;  Laterality: N/A;  . NASAL SEPTUM SURGERY  1980  . NM MYOCAR PERF WALL MOTION  2003   persantine - normal static and dynamic study w/apical thinning and presvered LV  function, no ischemia  . SINUS EXPLORATION     ossifiying fibroma  . TEMPOROMANDIBULAR JOINT SURGERY  1986   Dr. Terence Lux  . TRANSTHORACIC ECHOCARDIOGRAM  2001   mild LVH, normal LV    Allergies  Allergen Reactions  . Banana Nausea And Vomiting  . Codeine Nausea Only    unless given with Phenergan  . Klonopin [Clonazepam] Other (See Comments)    Causes hallucination   . Meperidine Hcl Nausea Only    unless given with Phenergan  . Norflex [Orphenadrine Citrate] Nausea Only    Unless given with Phenergan  . Oxycodone-Acetaminophen Nausea Only    unless given with phenergan  . Propoxyphene Hcl Nausea Only    unless given with phenergan  . Zoloft [Sertraline Hcl] Other (See Comments)    Caused lethargy  . Doxycycline Other (See Comments)    Unknown reaction  . Naproxen Other (See Comments)    Unknown reaction   . Penicillins Other (See Comments)    Unknown reaction Has patient had a PCN reaction causing immediate rash, facial/tongue/throat swelling, SOB or lightheadedness with hypotension: Unknown Has patient had a PCN reaction causing severe rash involving mucus membranes or skin necrosis: Unknown Has patient had a PCN reaction that required hospitalization: pt was in the hospital at time of reaction Has patient had a PCN reaction occurring within the last 10 years: Unknown If all of the above answers are "NO", then may proceed with Cephalos  . Phenothiazines Other (See Comments)    Unknown reaction  . Stelazine Other (See Comments)    Unknown reaction  . Sulfamethoxazole-Trimethoprim Other (See Comments)    Unknown reaction  . Tolectin [Tolmetin Sodium] Other (See Comments)    Unknown reaction  . Tramadol Other (See Comments)    Unknown reaction    Outpatient Encounter Medications as of 03/07/2018  Medication Sig  . acetaminophen (TYLENOL) 325 MG tablet Take 650 mg by mouth daily as needed for headache (pain).  Marland Kitchen ALPRAZolam (XANAX) 0.25 MG tablet Take 1 tablet (0.25 mg total) by mouth 2 (two) times daily as needed.  Marland Kitchen antiseptic oral rinse (BIOTENE) LIQD 15 mLs by Mouth Rinse route 5 (five) times daily as needed for dry mouth.  Marland Kitchen aspirin 325 MG tablet Take 1 tablet (325 mg total) by mouth daily.  . carboxymethylcellulose (REFRESH TEARS) 0.5 % SOLN Place 1 drop into both eyes 5 (five) times daily as needed (dry eyes).   . CARTIA XT 180 MG 24 hr capsule TAKE 1 CAPSULE BY MOUTH ONCE DAILY  . DULoxetine (CYMBALTA) 30 MG capsule TAKE 1 CAPSULE BY MOUTH ONCE DAILY FOR BACK PAIN AND FOR DEPRESSION  . DULoxetine (CYMBALTA) 60 MG capsule TAKE 1 CAPSULE BY MOUTH ONCE DAILY  . furosemide (LASIX) 40 MG tablet Take 1 tablet (40 mg total) by mouth daily.  Marland Kitchen glucose blood (ONETOUCH VERIO) test strip Use as instructed twice daily (E11.65)  . hydroxychloroquine (PLAQUENIL) 200 MG tablet TAKE 1 TABLET BY  MOUTH ONCE DAILY  . JANUVIA 100 MG tablet TAKE 1 TABLET BY MOUTH ONCE DAILY  . lansoprazole (PREVACID) 30 MG capsule TAKE 1 CAPSULE BY MOUTH ONCE DAILY BEFORE BREAKFAST  . LINZESS 290 MCG CAPS capsule TAKE 1 CAPSULE BY MOUTH AT BEDTIME  . memantine (NAMENDA) 10 MG tablet TAKE 1 TABLET BY MOUTH TWICE DAILY  . MYRBETRIQ 50 MG TB24 tablet TAKE 1 TABLET BY MOUTH ONCE DAILY (Patient taking differently: 25 mg. )  . ONE TOUCH LANCETS MISC Use to test blood  sugar twice daily.  Dx: E11.9  . pilocarpine (SALAGEN) 5 MG tablet Take 1 tablet (5 mg total) by mouth 2 (two) times daily.  . polyethylene glycol (MIRALAX / GLYCOLAX) packet Take 17 g by mouth daily as needed (constipation). Mix in 8 oz liquid and drink  . potassium chloride SA (K-DUR,KLOR-CON) 20 MEQ tablet TAKE 1 TABLET BY MOUTH ONCE DAILY  . pregabalin (LYRICA) 100 MG capsule TAKE 1 CAPSULE BY MOUTH TWICE DAILY  . senna (SENOKOT) 8.6 MG TABS tablet Take 1 tablet (8.6 mg total) by mouth daily.  . sodium chloride (OCEAN) 0.65 % SOLN nasal spray Place 1 spray into the nose as needed for congestion.  . [DISCONTINUED] magic mouthwash w/lidocaine SOLN Take 5 mLs by mouth 4 (four) times daily.   No facility-administered encounter medications on file as of 03/07/2018.     Review of Systems:  Review of Systems  Constitutional: Negative for chills and fever.  HENT: Positive for hearing loss.   Eyes: Negative for blurred vision.  Respiratory: Negative for cough and shortness of breath.   Cardiovascular: Negative for chest pain, palpitations and leg swelling.  Gastrointestinal: Negative for abdominal pain, blood in stool, constipation, diarrhea and melena.  Genitourinary: Positive for dysuria, frequency and urgency. Negative for hematuria.       Chronic with incontinence  Musculoskeletal: Negative for falls and joint pain.  Skin: Negative for rash.  Neurological: Negative for dizziness and loss of consciousness.  Endo/Heme/Allergies: Bruises/bleeds  easily.  Psychiatric/Behavioral: Positive for memory loss. Negative for depression. The patient is not nervous/anxious and does not have insomnia.     Health Maintenance  Topic Date Due  . FOOT EXAM  07/04/1939  . OPHTHALMOLOGY EXAM  07/04/1939  . HEMOGLOBIN A1C  03/07/2018  . MAMMOGRAM  05/15/2018  . TETANUS/TDAP  12/09/2023  . INFLUENZA VACCINE  Completed  . DEXA SCAN  Completed  . PNA vac Low Risk Adult  Completed  . URINE MICROALBUMIN  Discontinued    Physical Exam: Vitals:   03/07/18 1301  BP: 120/68  Pulse: 68  Temp: 97.9 F (36.6 C)  TempSrc: Oral  SpO2: 97%  Weight: 143 lb (64.9 kg)  Height: 5' 3.5" (1.613 m)   Body mass index is 24.93 kg/m. Physical Exam Constitutional:      Appearance: Normal appearance.  HENT:     Head: Normocephalic and atraumatic.  Neck:     Musculoskeletal: Normal range of motion and neck supple.     Comments: thyromegaly Cardiovascular:     Rate and Rhythm: Normal rate and regular rhythm.     Pulses: Normal pulses.     Heart sounds: Normal heart sounds.  Pulmonary:     Effort: Pulmonary effort is normal.     Breath sounds: Normal breath sounds.  Abdominal:     General: Bowel sounds are normal.  Musculoskeletal: Normal range of motion.     Comments: Not using assistive device today  Skin:    General: Skin is warm and dry.  Neurological:     General: No focal deficit present.     Mental Status: She is alert.     Comments: Oriented to person and place, not time, repeats stories, is more alert today than her usual  Psychiatric:     Comments: In good spirits today     Labs reviewed: Basic Metabolic Panel: Recent Labs    04/23/17 0001  04/23/17 2027  04/24/17 0814  04/29/17 0410 09/04/17 1554 01/29/18 1408  NA  --    < >  --    < >  --    < >  140 140 140  K  --    < >  --    < >  --    < > 3.9 4.3 4.2  CL  --    < >  --    < >  --    < > 105 100 102  CO2  --    < >  --    < >  --    < > 26 29 28   GLUCOSE  --    < >   --    < >  --    < > 181* 80 84  BUN  --    < >  --    < >  --    < > 23* 20 27*  CREATININE  --    < >  --    < >  --    < > 0.91 0.84 1.03*  CALCIUM  --    < >  --    < >  --    < > 9.4 9.7 10.1  MG  --   --  2.0  --  2.0  --   --   --   --   TSH 0.920  --   --   --   --   --   --  1.35  --    < > = values in this interval not displayed.   Liver Function Tests: Recent Labs    04/23/17 1758 04/29/17 0512 01/29/18 1408  AST 25 27 21   ALT 24 26 13   ALKPHOS 81 71  --   BILITOT 0.7 0.5 0.3  PROT 6.7 6.6 7.3  ALBUMIN 3.7 3.6  --    Recent Labs    04/29/17 0512  LIPASE 65*   No results for input(s): AMMONIA in the last 8760 hours. CBC: Recent Labs    04/25/17 0636 04/29/17 0410 09/04/17 1554 01/29/18 1408  WBC 6.6 6.7 5.8 7.6  NEUTROABS 3.6  --  3,155 4,438  HGB 13.9 12.8 12.6 12.6  HCT 42.2 39.3 37.8 38.8  MCV 92.3 92.5 92.4 92.8  PLT 220 210 196 225   Lipid Panel: Recent Labs    09/06/17 0938  CHOL 183  HDL 63  LDLCALC 99  TRIG 113  CHOLHDL 2.9   Lab Results  Component Value Date   HGBA1C 6.4 (H) 09/04/2017    Assessment/Plan 1. Acute cystitis without hematuria -completed cipro, improved, still has usual OAB symptoms  2. Overactive bladder -cont myrbetriq  3. Chronic constipation -restart miralax due to hard stools  4. Thyroid nodule - appears nodules never followed up after pt was seeing prior PCP as she wound up hospitalized numerous times, reports some tenderness of her neck - US THYROID; Future  5. Vascular dementia without behavioral disturbance (HCC) -cont caregiver support  6. Diabetes mellitus due to underlying condition, uncontrolled, with hyperglycemia ([place]) -f/u hba1c--appears hba1c should be a lot better; sugars are in the 100s to 200s now - Hemoglobin A1c  Labs/tests ordered:   Orders Placed This Encounter  Procedures  . US THYROID    Standing Status:   Future    Standing Expiration Date:   05/06/2019    Order Specific  Question:   Reason for Exam (SYMPTOM  OR DIAGNOSIS REQUIRED)    Answer:   h/o multiple cysts and nodules    Order Specific Question:   Preferred imaging location?    Answer:   [facility]  . Hemoglobin A1c   Next appt:  4 mos med mgt  Tamu Golz L. Ahmere Hemenway, D.O. [employer] [address], [place] [address] Cell Phone (Mon-Fri 8am-5pm):  [phone] On Call:  [phone] & follow prompts after 5pm & weekends Office Phone:  [phone] Office Fax:  [phone]

## 2018-03-07 NOTE — Addendum Note (Signed)
Addended by: Despina Hidden on: 03/07/2018 02:12 PM   Modules accepted: Orders

## 2018-03-08 ENCOUNTER — Telehealth: Payer: Self-pay | Admitting: *Deleted

## 2018-03-08 LAB — HEMOGLOBIN A1C
Hgb A1c MFr Bld: 6 % of total Hgb — ABNORMAL HIGH (ref <5.7)
Mean Plasma Glucose: 126 (calc)
eAG (mmol/L): 7 (calc)

## 2018-03-08 NOTE — Telephone Encounter (Signed)
Shaquera, daughter called and stated that patient was seen yesterday and [name] ordered a Thyroid Ultrasound but they did not know the Doctors name that usually does it. It is: Dr. Jamse Arn with [facility] [phone]  She was just calling to let you know.

## 2018-03-11 ENCOUNTER — Ambulatory Visit
Admission: RE | Admit: 2018-03-11 | Discharge: 2018-03-11 | Disposition: A | Discharge status: Home / Self Care | Payer: Medicare Other | Source: Ambulatory Visit | Attending: Internal Medicine | Admitting: Internal Medicine

## 2018-03-11 DIAGNOSIS — E042 Nontoxic multinodular goiter: Secondary | ICD-10-CM | POA: Diagnosis not present

## 2018-03-11 DIAGNOSIS — E041 Nontoxic single thyroid nodule: Secondary | ICD-10-CM

## 2018-03-20 ENCOUNTER — Ambulatory Visit (INDEPENDENT_AMBULATORY_CARE_PROVIDER_SITE_OTHER): Payer: Medicare Other | Admitting: Internal Medicine

## 2018-03-20 ENCOUNTER — Encounter: Payer: Self-pay | Admitting: Internal Medicine

## 2018-03-20 VITALS — BP 102/54 | HR 77 | Ht 63.5 in | Wt 147.6 lb

## 2018-03-20 DIAGNOSIS — I480 Paroxysmal atrial fibrillation: Secondary | ICD-10-CM | POA: Diagnosis not present

## 2018-03-20 DIAGNOSIS — I10 Essential (primary) hypertension: Secondary | ICD-10-CM | POA: Diagnosis not present

## 2018-03-20 DIAGNOSIS — F0391 Unspecified dementia with behavioral disturbance: Secondary | ICD-10-CM

## 2018-03-20 DIAGNOSIS — F03918 Unspecified dementia, unspecified severity, with other behavioral disturbance: Secondary | ICD-10-CM

## 2018-03-20 MED ORDER — DILTIAZEM HCL ER COATED BEADS 180 MG PO CP24: 180.0000 mg | ORAL_CAPSULE | Freq: Every day | ORAL | 3 refills | Status: DC

## 2018-03-20 NOTE — Patient Instructions (Signed)
Medication Instructions:  Your physician recommends that you continue on your current medications as directed. Please refer to the Current Medication list given to you today.  If you need a refill on your cardiac medications before your next appointment, please call your pharmacy.   Lab work: None ordered If you have labs (blood work) drawn today and your tests are completely normal, you will receive your results only by: Marland Kitchen MyChart Message (if you have MyChart) OR . A paper copy in the mail If you have any lab test that is abnormal or we need to change your treatment, we will call you to review the results.  Testing/Procedures: None ordered  Follow-Up: At [facility], you and your health needs are our priority.  As part of our continuing mission to provide you with exceptional heart care, we have created designated Provider Care Teams.  These Care Teams include your primary Cardiologist (physician) and Advanced Practice Providers (APPs -  Physician Assistants and Nurse Practitioners) who all work together to provide you with the care you need, when you need it. You will need a follow up appointment in 12 months.  Please call our office 2 months in advance to schedule this appointment.  You may see No primary care provider on file. Dr.Hilty or one of the following Advanced Practice Providers on your designated Care Team: Almyra Deforest, [place] . Fabian Sharp, PA-C

## 2018-03-20 NOTE — Progress Notes (Signed)
OFFICE NOTE  Chief Complaint:  No complaints  Primary Care Physician: Gayland Curry, DO  HPI:  ALEXICIA CELLINI an 85 year old female with history of some confusion in the past, hypertension and Sjogren syndrome. I had added lisinopril to her regimen which has helped control her blood pressure much better. She does report good control over her blood pressure over the past year; however, has had recent worsening problems such as cough, new productive sputum, problems with urinary tract infections. Weight is up back to 126 pounds after dip to 116 pounds but this is about where she is normally with a good appetite. Blood pressure is noted to be mildly elevated today; however, she is in some discomfort and has been short of breath with productive cough. Denies any chest pain. She does have pain in her back that shoots to her right thigh when sitting or leaning down. Today she has a number of other complaints. Her main issue is palpitations which she reports she had an episode of fast heart rate for almost one month but it has somewhat resolved. She still gets some recurrent palpitations.  I saw Ms. Mesic back in the office today. She was recently seen by [name] and according to her notes his had marked progression of her dementia. She's now having significant behavioral disturbance as well as wandering. This is cause significant stress for her daughter and family. Compliance with medications is poor. There have been falls at home. Today in the office she is noted to be in new onset atrial fibrillation with rapid ventricular response. Heart rate is in the 120s. I had a long discussion with her daughter and her to the extent that she could understand about atrial fibrillation and her increased risk for stroke. Her CHADSVASC score is elevated at 4. She's also complaining of some abdominal discomfort but no significant chest pain. Blood pressure was low today at 90/70.  Unfortunately Ms. Alicea returns  today for follow-up. She was recently hospitalized after an episode of transient global amnesia. She was ultimately found to have stroke with 2 clear punctate lesions on imaging. Is felt to be cardioembolic and may be related to her atrial fibrillation. She continues to have falls and in fact the fall was her initial presentation. As we discussed at length before she is at very high risk for complications on increased anticoagulation such as warfarin or a direct oral anticoagulant. Despite her CHADSVASC score now of 6, it is felt that anticoagulation is too risky. She has had an increase in her aspirin from 81 mg to 325 mg.  Mrs. Hornacek returns today for follow-up. She continues to have significant behavioral issues related to dementia and possibly bipolar type symptoms. From a cardiac standpoint, her main complaints continue to be lower extremity swelling. She's not been fitted with compression stockings as it's been too difficult for her daughter to get her to [place]. She is requesting treatment for the edema although Mrs. Cata reports that she does have significant incontinence. She continues to live at home and get some home assistance, but that is winding down after her recent stroke. She had another fall last week and I still feel is not a good candidate for anticoagulation beyond aspirin.  08/16/2015  I saw Ms. Brizzolara back today in the office. Unfortunately she was recently admitted for UTI and was found to be bradycardic at the time. Beta blocker was discontinued although she remains on diltiazem. She was placed on low-dose losartan for  additional blood pressure control. Since then she's had some labile blood pressures including some blood pressures in the 90s and low 388 systolic which is cause her to be dizzy and feel fatigued. Mention or some degree of autonomic dysfunction.  08/17/2016  Mrs. Myszka returns today for follow-up. Recently she's had some leg swelling and some weight gain. She was  put on Lasix by her primary care provider of her weight is fairly stable and not come down much. Daughter reported that she's been using boost on the weekends at least 3 or 4 times a day in addition to whatever she is eating. It could be that this is weight gain mostly related to increase calories. Blood pressure is actually been less labile recently. She is in an ectopic atrial rhythm today.  03/03/2017  Mrs. Bautch returns today for follow-up.  Overall she has a number of complaints mostly with her bowels.  She is gained some weight and reports some indigestion.  She has had trouble with constipation.  This is been an ongoing issue for her.  She has paroxysmal AF but is in sinus rhythm today.  She denies any chest pain.  She is not anticoagulated due to fall risk.  03/20/2018  Lakeya returns today for follow-up.  She has no new complaints.  She is accompanied by family member.  She denies any worsening shortness of breath or chest pain.  She is maintaining sinus rhythm from what I can tell.  Recent labs from July 2019 showed total cholesterol 183, HDL 63, LDL 99 and triglycerides 113.  Hemoglobin A1c was 6.  She has had only one fall in the past year.  Pressure was noted to be a little low today at 102/54.  PMHx:  Past Medical History:  Diagnosis Date  . Abnormality of gait   . Adenomatous polyp of colon 2002   76mm  . Allergic rhinitis   . Anxiety   . Anxiety and depression   . Chronic back pain   . Dementia without behavioral disturbance ([place])   . Depression   . Diverticulosis of colon   . Dry eye syndrome   . Dysphagia   . Dysthymic disorder   . Fibromyalgia   . GERD (gastroesophageal reflux disease)   . H/O hiatal hernia   . History of adverse drug reaction   . History of cerebrovascular disease 09/24/2014  . History of recurrent UTIs   . Hypertension, benign   . Irritable bowel syndrome   . Low back pain syndrome   . Memory loss   . Mitral valve prolapse   . Paroxysmal A-fib  ([place])   . Peripheral neuropathy    "both feet and legs"  . Physical deconditioning   . Sjogren's syndrome ([place])   . Therapeutic opioid-induced constipation (OIC)   . Thyroid nodule   . Urinary incontinence     Past Surgical History:  Procedure Laterality Date  . ABDOMINAL HYSTERECTOMY  1967  . APPENDECTOMY    . CARDIAC CATHETERIZATION  02/17/2003   normal L main, LAD free of disease, Cfx free of disease, RCA free of disease (Dr. Loni Muse. [place])  . CATARACT EXTRACTION, BILATERAL    . CHOLECYSTECTOMY  2000  . COLONOSCOPY W/ BIOPSIES     multiple  . DENTAL SURGERY     multiple tooth extractions  . ESOPHAGOGASTRODUODENOSCOPY (EGD) WITH ESOPHAGEAL DILATION N/A 08/23/2012   Procedure: ESOPHAGOGASTRODUODENOSCOPY (EGD) WITH ESOPHAGEAL DILATION;  Surgeon: Milus Banister, MD;  Location: [facility];  Service: Endoscopy;  Laterality: N/A;  . NASAL SEPTUM SURGERY  1980  . NM MYOCAR PERF WALL MOTION  2003   persantine - normal static and dynamic study w/apical thinning and presvered LV function, no ischemia  . SINUS EXPLORATION     ossifiying fibroma  . TEMPOROMANDIBULAR JOINT SURGERY  1986   Dr. Terence Lux  . TRANSTHORACIC ECHOCARDIOGRAM  2001   mild LVH, normal LV    FAMHx:  Family History  Problem Relation Age of Onset  . Heart disease Father        heart attack  . Pneumonia Father   . Heart attack Mother   . Hypertension Mother   . Colon cancer Sister   . Kidney disease Daughter   . Asthma Daughter   . Arthritis Daughter 82       osteo,  . Heart disease Son 66       stage 3 CHF(Diastolic /Systolic)  . Throat cancer Brother   . Hypertension Maternal Grandmother     SOCHx:   reports that she has quit smoking. She has never used smokeless tobacco. She reports that she does not drink alcohol or use drugs.  ALLERGIES:  Allergies  Allergen Reactions  . Banana Nausea And Vomiting  . Codeine Nausea Only    unless given with Phenergan  . Klonopin [Clonazepam] Other (See  Comments)    Causes hallucination   . Meperidine Hcl Nausea Only    unless given with Phenergan  . Norflex [Orphenadrine Citrate] Nausea Only    Unless given with Phenergan  . Oxycodone-Acetaminophen Nausea Only    unless given with phenergan  . Propoxyphene Hcl Nausea Only    unless given with phenergan  . Zoloft [Sertraline Hcl] Other (See Comments)    Caused lethargy  . Doxycycline Other (See Comments)    Unknown reaction  . Naproxen Other (See Comments)    Unknown reaction  . Penicillins Other (See Comments)    Unknown reaction Has patient had a PCN reaction causing immediate rash, facial/tongue/throat swelling, SOB or lightheadedness with hypotension: Unknown Has patient had a PCN reaction causing severe rash involving mucus membranes or skin necrosis: Unknown Has patient had a PCN reaction that required hospitalization: pt was in the hospital at time of reaction Has patient had a PCN reaction occurring within the last 10 years: Unknown If all of the above answers are "NO", then may proceed with Cephalos  . Phenothiazines Other (See Comments)    Unknown reaction  . Stelazine Other (See Comments)    Unknown reaction  . Sulfamethoxazole-Trimethoprim Other (See Comments)    Unknown reaction  . Tolectin [Tolmetin Sodium] Other (See Comments)    Unknown reaction  . Tramadol Other (See Comments)    Unknown reaction    ROS: Pertinent items noted in HPI and remainder of comprehensive ROS otherwise negative.  HOME MEDS: Current Outpatient Medications  Medication Sig Dispense Refill  . acetaminophen (TYLENOL) 325 MG tablet Take 650 mg by mouth daily as needed for headache (pain).    Marland Kitchen ALPRAZolam (XANAX) 0.25 MG tablet Take 1 tablet (0.25 mg total) by mouth 2 (two) times daily as needed. 60 tablet 0  . antiseptic oral rinse (BIOTENE) LIQD 15 mLs by Mouth Rinse route 5 (five) times daily as needed for dry mouth.    Marland Kitchen aspirin 325 MG tablet Take 1 tablet (325 mg total) by mouth  daily. 30 tablet 2  . carboxymethylcellulose (REFRESH TEARS) 0.5 % SOLN Place 1 drop into both eyes 5 (five) times daily  as needed (dry eyes).     . CARTIA XT 180 MG 24 hr capsule TAKE 1 CAPSULE BY MOUTH ONCE DAILY 90 capsule 0  . DULoxetine (CYMBALTA) 30 MG capsule TAKE 1 CAPSULE BY MOUTH ONCE DAILY FOR BACK PAIN AND FOR DEPRESSION 90 capsule 0  . DULoxetine (CYMBALTA) 60 MG capsule TAKE 1 CAPSULE BY MOUTH ONCE DAILY 90 capsule 0  . furosemide (LASIX) 40 MG tablet Take 1 tablet (40 mg total) by mouth daily. 90 tablet 1  . glucose blood (ONETOUCH VERIO) test strip Use as instructed twice daily (E11.65) 100 each 12  . hydroxychloroquine (PLAQUENIL) 200 MG tablet TAKE 1 TABLET BY MOUTH ONCE DAILY 90 tablet 0  . JANUVIA 100 MG tablet TAKE 1 TABLET BY MOUTH ONCE DAILY 90 tablet 0  . lansoprazole (PREVACID) 30 MG capsule Take 1 capsule (30 mg total) by mouth daily at 12 noon. 90 capsule 1  . LINZESS 290 MCG CAPS capsule TAKE 1 CAPSULE BY MOUTH AT BEDTIME 30 capsule 2  . memantine (NAMENDA) 10 MG tablet TAKE 1 TABLET BY MOUTH TWICE DAILY 180 tablet 1  . MYRBETRIQ 50 MG TB24 tablet TAKE 1 TABLET BY MOUTH ONCE DAILY (Patient taking differently: 25 mg. ) 90 tablet 1  . ONE TOUCH LANCETS MISC Use to test blood sugar twice daily.  Dx: E11.9 100 each 11  . pilocarpine (SALAGEN) 5 MG tablet Take 1 tablet (5 mg total) by mouth 2 (two) times daily. 180 tablet 1  . polyethylene glycol (MIRALAX / GLYCOLAX) packet Take 17 g by mouth daily as needed (constipation). Mix in 8 oz liquid and drink    . potassium chloride SA (K-DUR,KLOR-CON) 20 MEQ tablet TAKE 1 TABLET BY MOUTH ONCE DAILY 90 tablet 1  . pregabalin (LYRICA) 100 MG capsule TAKE 1 CAPSULE BY MOUTH TWICE DAILY 60 capsule 3  . senna (SENOKOT) 8.6 MG TABS tablet Take 1 tablet (8.6 mg total) by mouth daily. 120 each 0  . sodium chloride (OCEAN) 0.65 % SOLN nasal spray Place 1 spray into the nose as needed for congestion. 30 mL 0   No current  facility-administered medications for this visit.     LABS/IMAGING: No results found for this or any previous visit (from the past 48 hour(s)). No results found.  VITALS: BP (!) 102/54   Pulse 77   Ht 5' 3.5" (1.613 m)   Wt 147 lb 9.6 oz (67 kg)   BMI 25.74 kg/m   EXAM: General appearance: alert and no distress Neck: no carotid bruit and no JVD Lungs: clear to auscultation bilaterally Heart: regular rate and rhythm, S1, S2 normal, no murmur, click, rub or gallop Abdomen: soft, non-tender; bowel sounds normal; no masses,  no organomegaly Extremities: extremities normal, atraumatic, no cyanosis or edema Pulses: 2+ and symmetric Skin: Skin color, texture, turgor normal. No rashes or lesions Neurologic: Grossly normal Psych: Pleasant  EKG: Sinus rhythm first-degree AV block at 77-personally reviewed  ASSESSMENT: 1. Paroxysmal atrial fibrillation with rapid ventricular response - CHADSVASC 6 (not on warfarin due to falls) 2. Recent stroke 3. Labile hypertension - allowing higher BP than normal 4. Chest wall pain 5. Palpitations 6. Sjogren syndrome 7. Progressive dementia with behavioral disturbance 8. Falls and medication noncompliance 9. Asymmetric left lower extremity edema 10.   PLAN: 1.   Ms. Imm denies any chest pain or worsening shortness of breath.  She has had no recent palpitations although she had new onset PAF, she is in sinus rhythm today.  She is not anticoagulated, despite a CHADSVASC score of 6.  She is a very high fall risk.  Plan to continue her current medications and see her back annually or sooner as necessary.  Pixie Casino, MD, [facility] [place] Director of the [facility]  [facility] Diplomate of the American Board of Clinical Lipidology Attending Cardiologist  Direct Dial: [phone]  Fax: [phone]  Website:  [email or web address] 03/20/2018,  3:58 PM

## 2018-03-22 ENCOUNTER — Other Ambulatory Visit: Payer: Self-pay | Admitting: *Deleted

## 2018-03-22 ENCOUNTER — Other Ambulatory Visit: Payer: Self-pay | Admitting: Internal Medicine

## 2018-03-22 MED ORDER — ALPRAZOLAM 0.25 MG PO TABS: 0.2500 mg | ORAL_TABLET | Freq: Two times a day (BID) | ORAL | 0 refills | Status: DC | PRN

## 2018-03-22 NOTE — Telephone Encounter (Signed)
Patient daughter requested. Phoned to pharmacy.

## 2018-03-23 ENCOUNTER — Encounter: Payer: Self-pay | Admitting: Internal Medicine

## 2018-03-27 ENCOUNTER — Telehealth: Payer: Self-pay | Admitting: *Deleted

## 2018-03-27 NOTE — Telephone Encounter (Signed)
Caregiver notified and agreed.  

## 2018-03-27 NOTE — Telephone Encounter (Signed)
Ariana [name], Caregiver called and wanted to know what patient could take for allergies that will not interact with her medications. Caregiver stated that the Zyrtec provides no relief. Patient has nasal congestion/stuffy. Please Advise.

## 2018-03-27 NOTE — Telephone Encounter (Signed)
She can try claritin instead of zyrtec.

## 2018-03-29 ENCOUNTER — Other Ambulatory Visit: Payer: Self-pay | Admitting: Internal Medicine

## 2018-04-04 NOTE — Addendum Note (Signed)
Addended by: Vennie Homans on: 04/04/2018 10:57 AM   Modules accepted: Orders

## 2018-04-09 ENCOUNTER — Telehealth: Payer: Self-pay | Admitting: Internal Medicine

## 2018-04-09 MED ORDER — LINACLOTIDE 290 MCG PO CAPS: ORAL_CAPSULE | ORAL | 0 refills | Status: DC

## 2018-04-09 NOTE — Telephone Encounter (Signed)
Linzess refilled and patient has up coming appointment.

## 2018-04-09 NOTE — Telephone Encounter (Signed)
Pt and caregiver on the phone pt needs refill of LINZESS 290 MCG CAPS capsule [[id number]]  Pt is sched for 05/07/2018 with dr.gessner

## 2018-04-22 ENCOUNTER — Other Ambulatory Visit: Payer: Self-pay | Admitting: *Deleted

## 2018-04-22 MED ORDER — ALPRAZOLAM 0.25 MG PO TABS: 0.2500 mg | ORAL_TABLET | Freq: Two times a day (BID) | ORAL | 0 refills | Status: DC | PRN

## 2018-04-22 NOTE — Telephone Encounter (Signed)
Patient daughter requested refill. [name] Verified  LR: 03/22/2018

## 2018-05-02 ENCOUNTER — Telehealth: Payer: Self-pay | Admitting: Internal Medicine

## 2018-05-02 MED ORDER — LINACLOTIDE 290 MCG PO CAPS: ORAL_CAPSULE | ORAL | 4 refills | Status: DC

## 2018-05-02 NOTE — Telephone Encounter (Signed)
Patient's daughter Kamaya informed that Linzess has been refilled. They will call back when the COVID-19 has calmed down to make an appointment.

## 2018-05-03 ENCOUNTER — Other Ambulatory Visit: Payer: Self-pay | Admitting: Internal Medicine

## 2018-05-03 ENCOUNTER — Telehealth: Payer: Self-pay | Admitting: Pharmacy Technician

## 2018-05-03 DIAGNOSIS — M545 Low back pain, unspecified: Secondary | ICD-10-CM

## 2018-05-03 DIAGNOSIS — G6289 Other specified polyneuropathies: Secondary | ICD-10-CM

## 2018-05-03 DIAGNOSIS — F341 Dysthymic disorder: Secondary | ICD-10-CM

## 2018-05-03 MED ORDER — HYDROXYCHLOROQUINE SULFATE 200 MG PO TABS: 200.0000 mg | ORAL_TABLET | Freq: Every day | ORAL | 0 refills | Status: DC

## 2018-05-03 NOTE — Telephone Encounter (Signed)
Patient is on Plaquenil. Please send in prescription  Called patient, no answer or voicemail

## 2018-05-07 ENCOUNTER — Ambulatory Visit: Payer: Medicare Other | Admitting: Internal Medicine

## 2018-05-09 ENCOUNTER — Other Ambulatory Visit: Payer: Self-pay | Admitting: Physician Assistant

## 2018-05-09 ENCOUNTER — Other Ambulatory Visit: Payer: Self-pay | Admitting: Internal Medicine

## 2018-05-09 DIAGNOSIS — M797 Fibromyalgia: Secondary | ICD-10-CM

## 2018-05-09 NOTE — Telephone Encounter (Signed)
Last Visit: 01/29/2018 Next Visit: 01/30/2019  Okay to refill per Dr. Deveshwar. 

## 2018-05-09 NOTE — Telephone Encounter (Signed)
OK to refill

## 2018-05-09 NOTE — Telephone Encounter (Signed)
Refill request received via interface.  Completed and submitted.

## 2018-05-15 ENCOUNTER — Other Ambulatory Visit: Payer: Self-pay | Admitting: Internal Medicine

## 2018-05-15 DIAGNOSIS — E119 Type 2 diabetes mellitus without complications: Secondary | ICD-10-CM

## 2018-05-22 ENCOUNTER — Other Ambulatory Visit: Payer: Self-pay | Admitting: Internal Medicine

## 2018-05-22 ENCOUNTER — Other Ambulatory Visit: Payer: Self-pay | Admitting: *Deleted

## 2018-05-22 MED ORDER — ALPRAZOLAM 0.25 MG PO TABS: 0.2500 mg | ORAL_TABLET | Freq: Two times a day (BID) | ORAL | 0 refills | Status: DC | PRN

## 2018-05-22 NOTE — Telephone Encounter (Signed)
Patient daughter requested refill.  

## 2018-05-22 NOTE — Telephone Encounter (Signed)
Last filled 04/22/2018  [place] Database verified and compliance confirmed   

## 2018-05-23 ENCOUNTER — Telehealth: Payer: Self-pay | Admitting: *Deleted

## 2018-05-23 NOTE — Telephone Encounter (Signed)
Patient daughter, Signe called and stated that patient's blood pressure has been running low and patient has been sleeping a lot. Stated that patient does not deal well with change and in March they switched caregivers and now with the COVID-19 patient is watching about it 24/7.   3/14 113/74 Pulse 74 3/16 109/59 Pulse 66 4/6- 122/66  Pulse 59 4/8- 121/60  Pulse 63 4/9- 88/65, 88/64, 95/67 (taken 3 different times. No time noted) Pulse 113  Please Advise.

## 2018-05-23 NOTE — Telephone Encounter (Signed)
Called Devika back and spoke with her for 17 minutes.    Her mother was weak and fatigued when she sat up with the low blood pressures.  They had not taken her temperature.  Her diastolic had stayed low.  Her pulse is up today.  Both systolic and diastolic are low now.  She's sleeping a lot.  She has a new caregiver b/c Radonna Ricker was near her due date.  Caregiver also said pt did not eat dinner last night.  She slept through her dinner.  She has missed about 4 meals over the course of the month.    The last time, she did have a UTI and she was treated for a week.  Kennia says her mom often needs 10 days of antibiotics to get better.  She is going thru 2 packages of depends per week.  She's still on the myrbetriq.  She was meant to f/u 4/3 with urology but they opted not to bring her in for this.  So it is now going to be 6/1.    She is also watching the news about covid-19 nonstop.  She is worrying about this considerably.    Neuropathy is bothering her more lately, also.    The new caregiver is also concerned about things being new that normally weren't bothering patient and are chronic.  Plan: Back to scheduled toileting every 2 hours in the daytime. Hold lasix/furosemide Sat and Sunday Weigh tonight, Sat, Sun and Monday mornings BP and HR daily by caregiver We will do a phone visit with patient and caregiver b/w 9-5 on Monday.  Please schedule appt to f/u on hypotension and lethargy. We discussed avoiding hospital visits especially now.  Call back to our main number over the weekend if she continues to decline.

## 2018-05-27 ENCOUNTER — Encounter: Payer: Self-pay | Admitting: Internal Medicine

## 2018-05-27 ENCOUNTER — Ambulatory Visit (INDEPENDENT_AMBULATORY_CARE_PROVIDER_SITE_OTHER): Payer: Medicare Other | Admitting: Internal Medicine

## 2018-05-27 ENCOUNTER — Other Ambulatory Visit: Payer: Self-pay

## 2018-05-27 DIAGNOSIS — N3281 Overactive bladder: Secondary | ICD-10-CM

## 2018-05-27 DIAGNOSIS — I480 Paroxysmal atrial fibrillation: Secondary | ICD-10-CM

## 2018-05-27 DIAGNOSIS — E860 Dehydration: Secondary | ICD-10-CM | POA: Diagnosis not present

## 2018-05-27 DIAGNOSIS — F0150 Vascular dementia without behavioral disturbance: Secondary | ICD-10-CM | POA: Diagnosis not present

## 2018-05-27 NOTE — Telephone Encounter (Signed)
Spoke with daughter and got pt's cell phone number to do the telephone visit scheduled for today.

## 2018-05-27 NOTE — Progress Notes (Signed)
Patient ID: Ariana [name], female   DOB: [birth year], 85 y.o.   MRN: [id number] This service is provided via telemedicine  No vital signs collected/recorded due to the encounter was a telemedicine visit.   Location of patient (ex: home, work):  home  Patient consents to a telephone visit:  yes  Location of the provider (ex: office, home):  office  Name of any referring provider:  Dr. Hollace Kinnier, DO  Names of all persons participating in the telemedicine service and their role in the encounter:  Patient, Ariana [name], CMA, Dr. Hollace Kinnier, DO  Time spent on call:  3:19    Provider:  Tyus Kallam L. Mariea Clonts, D.O., C.M.D.  Goals of Care:  Advanced Directives 11/05/2017  Does Patient Have a Medical Advance Directive? Yes  Type of Advance Directive [place]  Does patient want to make changes to medical advance directive? No - Patient declined  Copy of [place] in Chart? Yes  Would patient like information on creating a medical advance directive? -  Pre-existing out of facility DNR order (yellow form or pink MOST form) -   Chief Complaint  Patient presents with  . telephone visit    follow-up visit   HPI: Patient is a 85 y.o. female spoken with on phone due to hypotension and tachycardia on Friday.  Her daughter had contacted [place].  I gave her instructions for the caregiver to adjust her meds over the weekend and phone visit to be done today to f/u on weights, bps and pulses.  When we called the patient, she reported being home alone.  She does not provide reliable history. Amy is there with Ariana [name].   Today, pt is awake all day.  BP this am was 116/70.  She was w/o lasix as per instructions.  Over weekend better also.   101/69 73 113/71 75  She has a big tall tupperware cup filled with water and ice--she's had two today.  It's at least 16 oz.   She is normally eating except that one meal.   Weights could not be done--there was no scale.   Kaylise has tried to order one, but nobody's got them right now.    Back, legs and feet bother her.  Keeps salonpas rubbed on her back.    She continues to watch the covid news all day.    She did not take her lasix for sat or sun.    Amy:  [phone].    Past Medical History:  Diagnosis Date  . Abnormality of gait   . Adenomatous polyp of colon 2002   9mm  . Allergic rhinitis   . Anxiety   . Anxiety and depression   . Chronic back pain   . Dementia without behavioral disturbance ([place])   . Depression   . Diverticulosis of colon   . Dry eye syndrome   . Dysphagia   . Dysthymic disorder   . Fibromyalgia   . GERD (gastroesophageal reflux disease)   . H/O hiatal hernia   . History of adverse drug reaction   . History of cerebrovascular disease 09/24/2014  . History of recurrent UTIs   . Hypertension, benign   . Irritable bowel syndrome   . Low back pain syndrome   . Memory loss   . Mitral valve prolapse   . Paroxysmal A-fib ([place])   . Peripheral neuropathy    "both feet and legs"  . Physical deconditioning   . Sjogren's syndrome ([place])   .  Therapeutic opioid-induced constipation (OIC)   . Thyroid nodule   . Urinary incontinence     Past Surgical History:  Procedure Laterality Date  . ABDOMINAL HYSTERECTOMY  1967  . APPENDECTOMY    . CARDIAC CATHETERIZATION  02/17/2003   normal L main, LAD free of disease, Cfx free of disease, RCA free of disease (Dr. Loni Muse. [facility])  . CATARACT EXTRACTION, BILATERAL    . CHOLECYSTECTOMY  2000  . COLONOSCOPY W/ BIOPSIES     multiple  . DENTAL SURGERY     multiple tooth extractions  . ESOPHAGOGASTRODUODENOSCOPY (EGD) WITH ESOPHAGEAL DILATION N/A 08/23/2012   Procedure: ESOPHAGOGASTRODUODENOSCOPY (EGD) WITH ESOPHAGEAL DILATION;  Surgeon: Milus Banister, MD;  Location: [facility];  Service: Endoscopy;  Laterality: N/A;  . NASAL SEPTUM SURGERY  1980  . NM MYOCAR PERF WALL MOTION  2003   persantine - normal static and dynamic study  w/apical thinning and presvered LV function, no ischemia  . SINUS EXPLORATION     ossifiying fibroma  . TEMPOROMANDIBULAR JOINT SURGERY  1986   Dr. Terence Lux  . TRANSTHORACIC ECHOCARDIOGRAM  2001   mild LVH, normal LV    Allergies  Allergen Reactions  . Banana Nausea And Vomiting  . Codeine Nausea Only    unless given with Phenergan  . Klonopin [Clonazepam] Other (See Comments)    Causes hallucination   . Meperidine Hcl Nausea Only    unless given with Phenergan  . Norflex [Orphenadrine Citrate] Nausea Only    Unless given with Phenergan  . Oxycodone-Acetaminophen Nausea Only    unless given with phenergan  . Propoxyphene Hcl Nausea Only    unless given with phenergan  . Zoloft [Sertraline Hcl] Other (See Comments)    Caused lethargy  . Doxycycline Other (See Comments)    Unknown reaction  . Naproxen Other (See Comments)    Unknown reaction  . Penicillins Other (See Comments)    Unknown reaction Has patient had a PCN reaction causing immediate rash, facial/tongue/throat swelling, SOB or lightheadedness with hypotension: Unknown Has patient had a PCN reaction causing severe rash involving mucus membranes or skin necrosis: Unknown Has patient had a PCN reaction that required hospitalization: pt was in the hospital at time of reaction Has patient had a PCN reaction occurring within the last 10 years: Unknown If all of the above answers are "NO", then may proceed with Cephalos  . Phenothiazines Other (See Comments)    Unknown reaction  . Stelazine Other (See Comments)    Unknown reaction  . Sulfamethoxazole-Trimethoprim Other (See Comments)    Unknown reaction  . Tolectin [Tolmetin Sodium] Other (See Comments)    Unknown reaction  . Tramadol Other (See Comments)    Unknown reaction    Outpatient Encounter Medications as of 05/27/2018  Medication Sig  . acetaminophen (TYLENOL) 325 MG tablet Take 650 mg by mouth daily as needed for headache (pain).  Marland Kitchen ALPRAZolam (XANAX)  0.25 MG tablet Take 1 tablet by mouth twice daily as needed  . antiseptic oral rinse (BIOTENE) LIQD 15 mLs by Mouth Rinse route 5 (five) times daily as needed for dry mouth.  Marland Kitchen aspirin 325 MG tablet Take 1 tablet (325 mg total) by mouth daily.  . carboxymethylcellulose (REFRESH TEARS) 0.5 % SOLN Place 1 drop into both eyes 5 (five) times daily as needed (dry eyes).   Marland Kitchen diltiazem (CARTIA XT) 180 MG 24 hr capsule Take 1 capsule (180 mg total) by mouth daily.  . DULoxetine (CYMBALTA) 30 MG capsule TAKE 1  CAPSULE BY MOUTH ONCE DAILY FOR BACK PAIN AND FOR DEPRESSION  . DULoxetine (CYMBALTA) 60 MG capsule Take 1 capsule by mouth once daily  . furosemide (LASIX) 40 MG tablet Take 1 tablet (40 mg total) by mouth daily.  Marland Kitchen glucose blood (ONETOUCH VERIO) test strip Use as instructed twice daily (E11.65)  . hydroxychloroquine (PLAQUENIL) 200 MG tablet Take 1 tablet (200 mg total) by mouth daily.  Marland Kitchen JANUVIA 100 MG tablet Take 1 tablet by mouth once daily  . lansoprazole (PREVACID) 30 MG capsule Take 1 capsule (30 mg total) by mouth daily at 12 noon.  . linaclotide (LINZESS) 290 MCG CAPS capsule TAKE 1 CAPSULE BY MOUTH EVERY DAY AT BEDTIME  . memantine (NAMENDA) 10 MG tablet Take 1 tablet by mouth twice daily  . MYRBETRIQ 50 MG TB24 tablet TAKE 1 TABLET BY MOUTH ONCE DAILY  . ONE TOUCH LANCETS MISC Use to test blood sugar twice daily.  Dx: E11.9  . pilocarpine (SALAGEN) 5 MG tablet Take 1 tablet by mouth twice daily  . polyethylene glycol (MIRALAX / GLYCOLAX) packet Take 17 g by mouth daily as needed (constipation). Mix in 8 oz liquid and drink  . potassium chloride SA (K-DUR,KLOR-CON) 20 MEQ tablet Take 1 tablet by mouth once daily  . pregabalin (LYRICA) 100 MG capsule Take 1 capsule by mouth twice daily  . senna (SENOKOT) 8.6 MG TABS tablet Take 1 tablet (8.6 mg total) by mouth daily.  . sodium chloride (OCEAN) 0.65 % SOLN nasal spray Place 1 spray into the nose as needed for congestion.   No  facility-administered encounter medications on file as of 05/27/2018.     Review of Systems:  Review of Systems  Constitutional: Positive for malaise/fatigue. Negative for chills and fever.  HENT: Positive for hearing loss.   Eyes: Negative for blurred vision.  Respiratory: Negative for cough and shortness of breath.   Cardiovascular: Negative for chest pain, palpitations and leg swelling.  Gastrointestinal: Positive for constipation. Negative for abdominal pain, blood in stool, diarrhea and melena.  Genitourinary: Negative for dysuria.  Musculoskeletal: Positive for back pain. Negative for falls.  Skin: Negative for itching and rash.  Neurological: Positive for tingling and sensory change.  Endo/Heme/Allergies: Bruises/bleeds easily.  Psychiatric/Behavioral: Positive for depression and memory loss. The patient is not nervous/anxious and does not have insomnia.     Health Maintenance  Topic Date Due  . FOOT EXAM  07/04/1939  . OPHTHALMOLOGY EXAM  07/04/1939  . MAMMOGRAM  05/15/2018  . HEMOGLOBIN A1C  09/05/2018  . INFLUENZA VACCINE  09/14/2018  . TETANUS/TDAP  12/09/2023  . DEXA SCAN  Completed  . PNA vac Low Risk Adult  Completed  . URINE MICROALBUMIN  Discontinued    Physical Exam: Could not be performed as visit non face-to-face via phone   Labs reviewed: Basic Metabolic Panel: Recent Labs    09/04/17 1554 01/29/18 1408  NA 140 140  K 4.3 4.2  CL 100 102  CO2 29 28  GLUCOSE 80 84  BUN 20 27*  CREATININE 0.84 1.03*  CALCIUM 9.7 10.1  TSH 1.35  --    Liver Function Tests: Recent Labs    01/29/18 1408  AST 21  ALT 13  BILITOT 0.3  PROT 7.3   No results for input(s): LIPASE, AMYLASE in the last 8760 hours. No results for input(s): AMMONIA in the last 8760 hours. CBC: Recent Labs    09/04/17 1554 01/29/18 1408  WBC 5.8 7.6  NEUTROABS 3,155 4,438  HGB 12.6 12.6  HCT 37.8 38.8  MCV 92.4 92.8  PLT 196 225   Lipid Panel: Recent Labs     09/06/17 0938  CHOL 183  HDL 63  LDLCALC 99  TRIG 113  CHOLHDL 2.9   Lab Results  Component Value Date   HGBA1C 6.0 (H) 03/07/2018    Procedures since last visit: No results found.  Assessment/Plan 1. Vascular dementia without behavioral disturbance ([place]) -has caregiver during the week 9-5--new one named Amy who has more experience working with patients with dementia--phone number in hpi  2. Overactive bladder -cont myrbetriq 50mg  daily and return to q2h toileting  3. Dehydration -push fluids -restart lasix to avoid volume overload, but monitor for hypotension, tachycardia and lethargy that she had last week--is better now -daughter trying to get a scale for patient  4. PAF (paroxysmal atrial fibrillation) ([facility]) -rate controlled again now that rehydrated, cont ccb  Labs/tests ordered:  No new Next appt:  07/11/2018 Non face-to-face time spent on televisit:  23 minutes  Morad Tal L. Shiri Hodapp, D.O. [facility] [address], [place] [address] Cell Phone (Mon-Fri 8am-5pm):  [phone] On Call:  [phone] & follow prompts after 5pm & weekends Office Phone:  [phone] Office Fax:  [phone]

## 2018-05-29 ENCOUNTER — Telehealth: Payer: Self-pay | Admitting: *Deleted

## 2018-05-29 NOTE — Telephone Encounter (Signed)
Patient daughter, Halyn called and stated that patient is having diarrhea. Started at 4am this morning and been going throughout the day. The caregiver stated that patient does not have a fever. Patient is having Right sided pain. Has not tried anything OTC for the diarrhea. Wants to know what she can take. Please Advise.

## 2018-05-29 NOTE — Telephone Encounter (Signed)
I wonder if she ate something that caused her diarrhea?   I'd suggest pushing fluids--drinking more than just her two big plastic cups of water--needs 3-4 instead. Monitor vital signs. Ok to give imodium 2 capsules now.  Don't keep repeating because she has a history of constipation.

## 2018-05-29 NOTE — Telephone Encounter (Signed)
Patient daughter notified and agreed.  

## 2018-06-01 ENCOUNTER — Other Ambulatory Visit: Payer: Self-pay | Admitting: Pharmacotherapy

## 2018-06-04 ENCOUNTER — Other Ambulatory Visit: Payer: Self-pay | Admitting: Internal Medicine

## 2018-06-04 DIAGNOSIS — M545 Low back pain, unspecified: Secondary | ICD-10-CM

## 2018-06-04 DIAGNOSIS — G6289 Other specified polyneuropathies: Secondary | ICD-10-CM

## 2018-06-05 ENCOUNTER — Other Ambulatory Visit: Payer: Self-pay | Admitting: Internal Medicine

## 2018-06-05 DIAGNOSIS — M545 Low back pain, unspecified: Secondary | ICD-10-CM

## 2018-06-05 DIAGNOSIS — G6289 Other specified polyneuropathies: Secondary | ICD-10-CM

## 2018-06-13 ENCOUNTER — Telehealth: Payer: Self-pay | Admitting: *Deleted

## 2018-06-13 NOTE — Telephone Encounter (Signed)
How has her sugar been running?  How about her blood pressures and pulse?

## 2018-06-13 NOTE — Telephone Encounter (Signed)
Please contact the caregiver whose phone number is in the chart to get more info since she is with the patient.  Pt is to drink 64oz of water per day.  She is to be on a toileting schedule.  If she is constipated (confirm with caregiver), warm prune juice or prunes can be tried.  She is already on linzess, stool softeners and miralax.

## 2018-06-13 NOTE — Telephone Encounter (Signed)
Spoke with daughter, caregiver and patient. Pt and caregiver states pt has drank 3 large cups of water and a glass of milk and tea.  Daughter thinks the sleeping is a sign of a UTI "like back in 2012" per daughter. Daughter ran out of stool softeners and ordered some thru [facility] because her local store was sold out and she didn't want to run all over town searching for stool softeners. They will try the warm prune juice, please advise on the sleeping.

## 2018-06-13 NOTE — Telephone Encounter (Signed)
Shanen, daughter called and stated that the caregiver has noticed patient sleeping all the time. Stated that the caregiver will wake her up to take medications and she will take them and go back to sleep. Concerned that she is not getting much to drink. Went to the bathroom yesterday twice. Daughter is concerned that patient will get another bad UTI.  Patient is also complaining of constipation today. Please Advise.

## 2018-06-14 NOTE — Telephone Encounter (Signed)
Tried calling phone just rang, will try again later.

## 2018-06-21 ENCOUNTER — Other Ambulatory Visit: Payer: Self-pay | Admitting: *Deleted

## 2018-06-21 MED ORDER — ALPRAZOLAM 0.25 MG PO TABS: 0.2500 mg | ORAL_TABLET | Freq: Two times a day (BID) | ORAL | 0 refills | Status: DC | PRN

## 2018-06-21 NOTE — Telephone Encounter (Signed)
Daughter requested refill.  

## 2018-07-03 ENCOUNTER — Other Ambulatory Visit: Payer: Self-pay | Admitting: Internal Medicine

## 2018-07-03 DIAGNOSIS — G6289 Other specified polyneuropathies: Secondary | ICD-10-CM

## 2018-07-03 DIAGNOSIS — M545 Low back pain, unspecified: Secondary | ICD-10-CM

## 2018-07-04 NOTE — Telephone Encounter (Signed)
Verified patient's pharmacy, verified medication in [place] [employer] last filled on 06/05/2018 for 60 cap and no refills. Last appointment 05/27/2018 and next appointment is 07/11/2018

## 2018-07-11 ENCOUNTER — Ambulatory Visit (INDEPENDENT_AMBULATORY_CARE_PROVIDER_SITE_OTHER): Payer: Medicare Other | Admitting: Internal Medicine

## 2018-07-11 ENCOUNTER — Other Ambulatory Visit: Payer: Self-pay

## 2018-07-11 ENCOUNTER — Encounter: Payer: Self-pay | Admitting: Internal Medicine

## 2018-07-11 ENCOUNTER — Ambulatory Visit: Payer: Medicare Other | Admitting: Internal Medicine

## 2018-07-11 ENCOUNTER — Other Ambulatory Visit: Payer: Self-pay | Admitting: Internal Medicine

## 2018-07-11 DIAGNOSIS — N3281 Overactive bladder: Secondary | ICD-10-CM | POA: Diagnosis not present

## 2018-07-11 DIAGNOSIS — I480 Paroxysmal atrial fibrillation: Secondary | ICD-10-CM

## 2018-07-11 DIAGNOSIS — K5909 Other constipation: Secondary | ICD-10-CM

## 2018-07-11 DIAGNOSIS — F0150 Vascular dementia without behavioral disturbance: Secondary | ICD-10-CM | POA: Diagnosis not present

## 2018-07-11 DIAGNOSIS — R6884 Jaw pain: Secondary | ICD-10-CM

## 2018-07-11 DIAGNOSIS — G629 Polyneuropathy, unspecified: Secondary | ICD-10-CM

## 2018-07-11 DIAGNOSIS — E0865 Diabetes mellitus due to underlying condition with hyperglycemia: Secondary | ICD-10-CM

## 2018-07-11 NOTE — Patient Instructions (Addendum)
Capsaicin cream for feet due to neuropathy.    Follow-up with ENT doctor about the jaw pain and difficulty chewing.    I recommend a tub bench during bathing to prevent falls.  I will include with this after visit summary, a prescription that can be given to a medial equipment company like [employer] or [facility].

## 2018-07-11 NOTE — Progress Notes (Signed)
Patient ID: Ariana [name], female   DOB: [birth year], 85 y.o.   MRN: [id number] This service is provided via telemedicine  No vital signs collected/recorded due to the encounter was a telemedicine visit.   Location of patient (ex: home, work):  HOME  Patient consents to a telephone visit:  YES  Location of the provider (ex: office, home):  OFFICE  Name of any referring provider:  Lacresha Fusilier, DO  Names of all persons participating in the telemedicine service and their role in the encounter:  PATIENT, [name], [place], [place], DO  Time spent on call:  5:46    Provider:  Charity Tessier L. Mariea Clonts, D.O., C.M.D.  Code Status: DNR Goals of Care:  Advanced Directives 11/05/2017  Does Patient Have a Medical Advance Directive? Yes  Type of Advance Directive [place]  Does patient want to make changes to medical advance directive? No - Patient declined  Copy of [place] in Chart? Yes  Would patient like information on creating a medical advance directive? -  Pre-existing out of facility DNR order (yellow form or pink MOST form) -     Chief Complaint  Patient presents with  . Medical Management of Chronic Issues    4MTH FOLLOW-UP    HPI: Patient is a 85 y.o. female seen today for medical management of chronic diseases.    She's c/o her dentures not fitting right.  This is a chronic complaint.  She asked if she's on the same medication as President Trump (she is on plaquenil for her sjogren's disease).    She is sleeping a lot compared to usual.    Here pain in her feet or legs is bad.  They stay numb or hurt her.  Her back hurts all of the time.  Her face hurts when she's eating.  She used a Psychologist, sport and exercise and heating pad yesterday on there.  She cannot eat grilled chicken which is hard for her.  Her caregiver, Amy, put rotisserie chicken in the fridge for her instead.  The right side of her nose is stopped up also.  She does have a deviated  septum and a prior cyst up there.   Sugar was 177 today which is the highest ever.  Otherwise it's good.    BPs are running good.  125/66.    No scale at home so not clear if weight up.  She was sob, but no edema.  Did eat some eggs and cereal, then grilled cheese, diced peaches.  She does eat good when she's able to chew it.  Having chicken with mashed potatoes, beans for dinner.  She finishes her dinner, too.    Pt is not using a tub bench or chair which is recommended.  She's high risk of falling during transfers.  She's actually going all the way into the tub.    Past Medical History:  Diagnosis Date  . Abnormality of gait   . Adenomatous polyp of colon 2002   50mm  . Allergic rhinitis   . Anxiety   . Anxiety and depression   . Chronic back pain   . Dementia without behavioral disturbance ([place])   . Depression   . Diverticulosis of colon   . Dry eye syndrome   . Dysphagia   . Dysthymic disorder   . Fibromyalgia   . GERD (gastroesophageal reflux disease)   . H/O hiatal hernia   . History of adverse drug reaction   . History of cerebrovascular  disease 09/24/2014  . History of recurrent UTIs   . Hypertension, benign   . Irritable bowel syndrome   . Low back pain syndrome   . Memory loss   . Mitral valve prolapse   . Paroxysmal A-fib ([place])   . Peripheral neuropathy    "both feet and legs"  . Physical deconditioning   . Sjogren's syndrome ([place])   . Therapeutic opioid-induced constipation (OIC)   . Thyroid nodule   . Urinary incontinence     Past Surgical History:  Procedure Laterality Date  . ABDOMINAL HYSTERECTOMY  1967  . APPENDECTOMY    . CARDIAC CATHETERIZATION  02/17/2003   normal L main, LAD free of disease, Cfx free of disease, RCA free of disease (Dr. Loni Muse. Little)  . CATARACT EXTRACTION, BILATERAL    . CHOLECYSTECTOMY  2000  . COLONOSCOPY W/ BIOPSIES     multiple  . DENTAL SURGERY     multiple tooth extractions  . ESOPHAGOGASTRODUODENOSCOPY (EGD) WITH  ESOPHAGEAL DILATION N/A 08/23/2012   Procedure: ESOPHAGOGASTRODUODENOSCOPY (EGD) WITH ESOPHAGEAL DILATION;  Surgeon: Milus Banister, MD;  Location: [facility];  Service: Endoscopy;  Laterality: N/A;  . NASAL SEPTUM SURGERY  1980  . NM MYOCAR PERF WALL MOTION  2003   persantine - normal static and dynamic study w/apical thinning and presvered LV function, no ischemia  . SINUS EXPLORATION     ossifiying fibroma  . TEMPOROMANDIBULAR JOINT SURGERY  1986   Dr. Terence Lux  . TRANSTHORACIC ECHOCARDIOGRAM  2001   mild LVH, normal LV    Allergies  Allergen Reactions  . Banana Nausea And Vomiting  . Codeine Nausea Only    unless given with Phenergan  . Klonopin [Clonazepam] Other (See Comments)    Causes hallucination   . Meperidine Hcl Nausea Only    unless given with Phenergan  . Norflex [Orphenadrine Citrate] Nausea Only    Unless given with Phenergan  . Oxycodone-Acetaminophen Nausea Only    unless given with phenergan  . Propoxyphene Hcl Nausea Only    unless given with phenergan  . Zoloft [Sertraline Hcl] Other (See Comments)    Caused lethargy  . Doxycycline Other (See Comments)    Unknown reaction  . Naproxen Other (See Comments)    Unknown reaction  . Penicillins Other (See Comments)    Unknown reaction Has patient had a PCN reaction causing immediate rash, facial/tongue/throat swelling, SOB or lightheadedness with hypotension: Unknown Has patient had a PCN reaction causing severe rash involving mucus membranes or skin necrosis: Unknown Has patient had a PCN reaction that required hospitalization: pt was in the hospital at time of reaction Has patient had a PCN reaction occurring within the last 10 years: Unknown If all of the above answers are "NO", then may proceed with Cephalos  . Phenothiazines Other (See Comments)    Unknown reaction  . Stelazine Other (See Comments)    Unknown reaction  . Sulfamethoxazole-Trimethoprim Other (See Comments)    Unknown reaction  .  Tolectin [Tolmetin Sodium] Other (See Comments)    Unknown reaction  . Tramadol Other (See Comments)    Unknown reaction    Outpatient Encounter Medications as of 07/11/2018  Medication Sig  . acetaminophen (TYLENOL) 325 MG tablet Take 650 mg by mouth daily as needed for headache (pain).  Marland Kitchen ALPRAZolam (XANAX) 0.25 MG tablet Take 1 tablet (0.25 mg total) by mouth 2 (two) times daily as needed.  Marland Kitchen antiseptic oral rinse (BIOTENE) LIQD 15 mLs by Mouth Rinse route 5 (five)  times daily as needed for dry mouth.  Marland Kitchen aspirin 325 MG tablet Take 1 tablet (325 mg total) by mouth daily.  . carboxymethylcellulose (REFRESH TEARS) 0.5 % SOLN Place 1 drop into both eyes 5 (five) times daily as needed (dry eyes).   Marland Kitchen diltiazem (CARTIA XT) 180 MG 24 hr capsule Take 1 capsule (180 mg total) by mouth daily.  . DULoxetine (CYMBALTA) 30 MG capsule TAKE 1 CAPSULE BY MOUTH ONCE DAILY FOR BACK PAIN AND FOR DEPRESSION  . DULoxetine (CYMBALTA) 60 MG capsule Take 1 capsule by mouth once daily  . furosemide (LASIX) 40 MG tablet Take 1 tablet (40 mg total) by mouth daily.  . hydroxychloroquine (PLAQUENIL) 200 MG tablet Take 1 tablet (200 mg total) by mouth daily.  Marland Kitchen JANUVIA 100 MG tablet Take 1 tablet by mouth once daily  . lansoprazole (PREVACID) 30 MG capsule Take 1 capsule (30 mg total) by mouth daily at 12 noon.  . linaclotide (LINZESS) 290 MCG CAPS capsule TAKE 1 CAPSULE BY MOUTH EVERY DAY AT BEDTIME  . memantine (NAMENDA) 10 MG tablet Take 1 tablet by mouth twice daily  . MYRBETRIQ 50 MG TB24 tablet TAKE 1 TABLET BY MOUTH ONCE DAILY  . ONE TOUCH LANCETS MISC Use to test blood sugar twice daily.  Dx: E11.9  . ONETOUCH VERIO test strip USE ONE STRIP TO CHECK GLUCOSE TWICE DAILY AS DIRECTED  . pilocarpine (SALAGEN) 5 MG tablet Take 1 tablet by mouth twice daily  . polyethylene glycol (MIRALAX / GLYCOLAX) packet Take 17 g by mouth daily as needed (constipation). Mix in 8 oz liquid and drink  . potassium chloride SA  (K-DUR,KLOR-CON) 20 MEQ tablet Take 1 tablet by mouth once daily  . pregabalin (LYRICA) 100 MG capsule Take 1 capsule by mouth twice daily  . senna (SENOKOT) 8.6 MG TABS tablet Take 1 tablet (8.6 mg total) by mouth daily.  . sodium chloride (OCEAN) 0.65 % SOLN nasal spray Place 1 spray into the nose as needed for congestion.   No facility-administered encounter medications on file as of 07/11/2018.     Review of Systems:  Review of Systems  Constitutional: Positive for malaise/fatigue. Negative for chills and fever.  HENT: Positive for hearing loss. Negative for congestion.        Dentures ill-fitting  Eyes: Negative for blurred vision.  Respiratory: Negative for cough and shortness of breath.   Cardiovascular: Positive for leg swelling. Negative for chest pain.  Gastrointestinal: Positive for constipation. Negative for abdominal pain, blood in stool, diarrhea and melena.  Genitourinary: Positive for frequency.       Some incontinence; more of the frequency at night than daytime  Musculoskeletal: Positive for back pain and joint pain. Negative for falls.  Skin: Negative for itching and rash.  Neurological: Negative for loss of consciousness and headaches.  Endo/Heme/Allergies: Bruises/bleeds easily.  Psychiatric/Behavioral: Positive for memory loss. Negative for depression. The patient is not nervous/anxious and does not have insomnia.    Health Maintenance  Topic Date Due  . FOOT EXAM  07/04/1939  . OPHTHALMOLOGY EXAM  07/04/1939  . MAMMOGRAM  05/15/2018  . HEMOGLOBIN A1C  09/05/2018  . INFLUENZA VACCINE  09/14/2018  . TETANUS/TDAP  12/09/2023  . DEXA SCAN  Completed  . PNA vac Low Risk Adult  Completed  . URINE MICROALBUMIN  Discontinued    Physical Exam: Could not be performed as visit non face-to-face via phone  Labs reviewed: Basic Metabolic Panel: Recent Labs    09/04/17 1554  01/29/18 1408  NA 140 140  K 4.3 4.2  CL 100 102  CO2 29 28  GLUCOSE 80 84  BUN 20  27*  CREATININE 0.84 1.03*  CALCIUM 9.7 10.1  TSH 1.35  --    Liver Function Tests: Recent Labs    01/29/18 1408  AST 21  ALT 13  BILITOT 0.3  PROT 7.3   No results for input(s): LIPASE, AMYLASE in the last 8760 hours. No results for input(s): AMMONIA in the last 8760 hours. CBC: Recent Labs    09/04/17 1554 01/29/18 1408  WBC 5.8 7.6  NEUTROABS 3,155 4,438  HGB 12.6 12.6  HCT 37.8 38.8  MCV 92.4 92.8  PLT 196 225   Lipid Panel: Recent Labs    09/06/17 0938  CHOL 183  HDL 63  LDLCALC 99  TRIG 113  CHOLHDL 2.9   Lab Results  Component Value Date   HGBA1C 6.0 (H) 03/07/2018    Procedures since last visit: No results found.  Assessment/Plan 1. Jaw pain -she has not c/o this to me before but apparently it's been a concern at least since her last fall at home and Amy, her caregiver, is well aware of it; it's interfering with patient's ability to chew properly so they're making her diet softer foods -needs ENT or oral surgery eval--Amy was relaying this to pt's daughter so appt can be made (pt previously did see one and did have a TMJ diagnosis by her own report)  2. Vascular dementia without behavioral disturbance (HCC) -ongoing, repeats same negatively twisted stories over and over again - For home use only DME Other see comment -still needs ALF but this never gets pursued when recommended   3. Overactive bladder -ongoing, helped some with myrbetriq, also tends to have colonization when urine is tested so avoiding new tests w/o NEW symptoms  4. Diabetes mellitus due to underlying condition, uncontrolled, with hyperglycemia ([place]) -much improve with close monitoring at home (had been with hba1c of 9) Lab Results  Component Value Date   HGBA1C 6.0 (H) 03/07/2018  -new caregiver is making her healthy foods - For home use only DME Other see comment  5. PAF (paroxysmal atrial fibrillation) (HCC) - ongoing, rate controlled typically--had an episode of rapid  afib and required extra dose of medication and diuresis - unfortunately, no scale in the home and daughter could not find one for pt so we don't know what her weight is like now--she remains very fatigued - For home use only DME Other see comment  6. Chronic constipation -has been a problem forever for her and she c/o bloating -cont bowel regimen as is, fiber and hydration, work on mobility at home, also - For home use only DME Other see comment  7. Peripheral polyneuropathy -ongoing, cont current regimen with cymbalta, lyrica and add capsaicin otc cream for her feet--caregiver educated on proper usage - For home use only DME Other see comment--tub bench ordered as pt not safe in and out of tub and does not have one  Labs/tests ordered:  Tub bench, f/u ENT Next appt:  11/08/2018 Non face-to-face time spent on televisit:  35 minutes  Adraine Biffle L. Avis Tirone, D.O. [facility] [address], [place] [address] Cell Phone (Mon-Fri 8am-5pm):  [phone] On Call:  [phone] & follow prompts after 5pm & weekends Office Phone:  [phone] Office Fax:  [phone]

## 2018-07-15 DIAGNOSIS — N3942 Incontinence without sensory awareness: Secondary | ICD-10-CM | POA: Diagnosis not present

## 2018-07-15 DIAGNOSIS — R3915 Urgency of urination: Secondary | ICD-10-CM | POA: Diagnosis not present

## 2018-07-17 DIAGNOSIS — E049 Nontoxic goiter, unspecified: Secondary | ICD-10-CM | POA: Diagnosis not present

## 2018-07-17 DIAGNOSIS — K219 Gastro-esophageal reflux disease without esophagitis: Secondary | ICD-10-CM | POA: Diagnosis not present

## 2018-07-17 DIAGNOSIS — J342 Deviated nasal septum: Secondary | ICD-10-CM | POA: Diagnosis not present

## 2018-07-22 ENCOUNTER — Other Ambulatory Visit: Payer: Self-pay | Admitting: *Deleted

## 2018-07-22 ENCOUNTER — Other Ambulatory Visit: Payer: Self-pay | Admitting: Internal Medicine

## 2018-07-22 MED ORDER — ALPRAZOLAM 0.25 MG PO TABS: 0.2500 mg | ORAL_TABLET | Freq: Two times a day (BID) | ORAL | 0 refills | Status: DC | PRN

## 2018-07-22 NOTE — Telephone Encounter (Signed)
Daughter requested refill  [place] Verified LR: 06/21/2018

## 2018-08-01 ENCOUNTER — Other Ambulatory Visit: Payer: Self-pay | Admitting: Internal Medicine

## 2018-08-01 ENCOUNTER — Other Ambulatory Visit: Payer: Self-pay | Admitting: Rheumatology

## 2018-08-01 DIAGNOSIS — M797 Fibromyalgia: Secondary | ICD-10-CM

## 2018-08-01 DIAGNOSIS — M545 Low back pain, unspecified: Secondary | ICD-10-CM

## 2018-08-01 DIAGNOSIS — G6289 Other specified polyneuropathies: Secondary | ICD-10-CM

## 2018-08-01 DIAGNOSIS — F341 Dysthymic disorder: Secondary | ICD-10-CM

## 2018-08-01 NOTE — Telephone Encounter (Signed)
Does pt take both doses of Cymbalta?  Database checked and verified

## 2018-08-01 NOTE — Telephone Encounter (Signed)
Last Visit: 01/29/2018 Next Visit: 01/30/2019  Okay to refill per Dr. Estanislado Pandy.

## 2018-08-01 NOTE — Telephone Encounter (Signed)
She does.

## 2018-08-06 DIAGNOSIS — N3941 Urge incontinence: Secondary | ICD-10-CM | POA: Diagnosis not present

## 2018-08-06 DIAGNOSIS — R351 Nocturia: Secondary | ICD-10-CM | POA: Diagnosis not present

## 2018-08-19 ENCOUNTER — Other Ambulatory Visit: Payer: Self-pay

## 2018-08-19 ENCOUNTER — Other Ambulatory Visit: Payer: Self-pay | Admitting: *Deleted

## 2018-08-19 DIAGNOSIS — L820 Inflamed seborrheic keratosis: Secondary | ICD-10-CM | POA: Diagnosis not present

## 2018-08-19 DIAGNOSIS — L570 Actinic keratosis: Secondary | ICD-10-CM | POA: Diagnosis not present

## 2018-08-19 DIAGNOSIS — D485 Neoplasm of uncertain behavior of skin: Secondary | ICD-10-CM | POA: Diagnosis not present

## 2018-08-19 NOTE — Telephone Encounter (Signed)
Patient daughter requested refill Pended Rx and sent to [name] for approval due to [place].

## 2018-08-20 DIAGNOSIS — R3915 Urgency of urination: Secondary | ICD-10-CM | POA: Diagnosis not present

## 2018-08-20 DIAGNOSIS — N3941 Urge incontinence: Secondary | ICD-10-CM | POA: Diagnosis not present

## 2018-08-20 MED ORDER — ALPRAZOLAM 0.25 MG PO TABS: 0.2500 mg | ORAL_TABLET | Freq: Two times a day (BID) | ORAL | 0 refills | Status: DC | PRN

## 2018-08-22 ENCOUNTER — Other Ambulatory Visit: Payer: Self-pay | Admitting: Internal Medicine

## 2018-08-27 DIAGNOSIS — R3915 Urgency of urination: Secondary | ICD-10-CM | POA: Diagnosis not present

## 2018-08-27 DIAGNOSIS — N3941 Urge incontinence: Secondary | ICD-10-CM | POA: Diagnosis not present

## 2018-08-29 ENCOUNTER — Other Ambulatory Visit: Payer: Self-pay | Admitting: Internal Medicine

## 2018-08-29 ENCOUNTER — Other Ambulatory Visit: Payer: Self-pay | Admitting: Rheumatology

## 2018-08-29 DIAGNOSIS — G6289 Other specified polyneuropathies: Secondary | ICD-10-CM

## 2018-08-29 DIAGNOSIS — M545 Low back pain, unspecified: Secondary | ICD-10-CM

## 2018-08-30 NOTE — Telephone Encounter (Signed)
Last Visit: 01/29/2018 Next Visit: 01/30/2019 Labs: 01/29/2018 CBC WNL. Creatinine is elevated and GFR is low Eye exam: 10/10/2017  Attempted to contact patient and left message on machine to advise patient she is due to update labs.   Okay to refill 30 day supply, per Dr. Estanislado Pandy.

## 2018-08-30 NOTE — Telephone Encounter (Signed)
Last filled 08/01/2018

## 2018-09-03 DIAGNOSIS — N3941 Urge incontinence: Secondary | ICD-10-CM | POA: Diagnosis not present

## 2018-09-03 DIAGNOSIS — R300 Dysuria: Secondary | ICD-10-CM | POA: Diagnosis not present

## 2018-09-05 ENCOUNTER — Other Ambulatory Visit: Payer: Self-pay | Admitting: Rheumatology

## 2018-09-05 ENCOUNTER — Other Ambulatory Visit: Payer: Self-pay | Admitting: Internal Medicine

## 2018-09-05 DIAGNOSIS — M545 Low back pain, unspecified: Secondary | ICD-10-CM

## 2018-09-05 DIAGNOSIS — K2100 Gastro-esophageal reflux disease with esophagitis, without bleeding: Secondary | ICD-10-CM

## 2018-09-05 DIAGNOSIS — G6289 Other specified polyneuropathies: Secondary | ICD-10-CM

## 2018-09-11 ENCOUNTER — Ambulatory Visit (INDEPENDENT_AMBULATORY_CARE_PROVIDER_SITE_OTHER): Payer: Medicare Other | Admitting: Family

## 2018-09-11 ENCOUNTER — Encounter: Payer: Self-pay | Admitting: Family

## 2018-09-11 ENCOUNTER — Other Ambulatory Visit: Payer: Self-pay

## 2018-09-11 VITALS — BP 122/80 | HR 73 | Temp 97.7°F | Ht 63.5 in | Wt 147.6 lb

## 2018-09-11 DIAGNOSIS — G629 Polyneuropathy, unspecified: Secondary | ICD-10-CM

## 2018-09-11 DIAGNOSIS — R1032 Left lower quadrant pain: Secondary | ICD-10-CM | POA: Diagnosis not present

## 2018-09-11 DIAGNOSIS — R1031 Right lower quadrant pain: Secondary | ICD-10-CM | POA: Diagnosis not present

## 2018-09-11 MED ORDER — ZIKS ARTHRITIS PAIN RELIEF 0.025-1-12 % EX CREA: 1.0000 "application " | TOPICAL_CREAM | Freq: Two times a day (BID) | CUTANEOUS | 0 refills | Status: DC

## 2018-09-11 NOTE — Progress Notes (Addendum)
Provider: Jorryn Casagrande FNP-C  Gayland Curry, DO  Patient Care Team: Gayland Curry, DO as PCP - General (Geriatric Medicine) Noralee Space, MD as Consulting Physician (Pulmonary Disease) Maisie Fus, MD as Consulting Physician (Obstetrics and Gynecology) Debara Pickett Nadean Corwin, MD as Consulting Physician (Cardiology) Monna Fam, MD as Consulting Physician (Ophthalmology) Kathrynn Ducking, MD as Consulting Physician (Neurology) Franchot Gallo, MD as Consulting Physician (Urology) Lavonna Monarch, MD as Consulting Physician (Dermatology) Gatha Mayer, MD as Consulting Physician (Gastroenterology)  Extended Emergency Contact Information Primary Emergency Contact: Era Skeen Address: [address]          [place], [place] [address] Johnnette Litter of [place] Phone: [phone] Mobile Phone: [phone] Relation: Daughter Secondary Emergency Contact: Arvin Collard [place] of [place] Mobile Phone: [phone] Relation: Other  Code Status: Full Code  Goals of care: Advanced Directive information Advanced Directives 11/05/2017  Does Patient Have a Medical Advance Directive? Yes  Type of Advance Directive [place]  Does patient want to make changes to medical advance directive? No - Patient declined  Copy of [place] in Chart? Yes  Would patient like information on creating a medical advance directive? -  Pre-existing out of facility DNR order (yellow form or pink MOST form) -     Chief Complaint  Patient presents with  . Acute Visit     Patient c.o of place on back right where underwear line is that is hurting constantly and leg and feet pain due to neuopathy and when patient gets up in morning feet are purple , are right side of abdomen is swollen and hurting for 4 month or more     HPI:  Pt is a 85 y.o. female seen today for an acute visit for evaluation of pain on the feet.she is here  escorted by care giver who states patient's feet are usually purple in the morning when she first gets up.she has a significant medical history of peripheral neuropathy.she takes Cymbalta and Lyrica.Per MD visit 07/11/18 patient was supposed to start Capsaicin cream.patient and patient's daughter states cream was not send to pharmacy.Also states Bench was supposed to be ordered on the same visit for patient to sit on in the shower.[facility] has refaxed order to DME for shower bench. She also complains of right abdominal pain " feels like it swollen and bloated" x 4 months or more.she denies any nausea,vomiting or diarrhea.she takes several meds for constipation.she has had a bowel movement today.     Past Medical History:  Diagnosis Date  . Abnormality of gait   . Adenomatous polyp of colon 2002   58mm  . Allergic rhinitis   . Anxiety   . Anxiety and depression   . Chronic back pain   . Dementia without behavioral disturbance ([place])   . Depression   . Diverticulosis of colon   . Dry eye syndrome   . Dysphagia   . Dysthymic disorder   . Fibromyalgia   . GERD (gastroesophageal reflux disease)   . H/O hiatal hernia   . History of adverse drug reaction   . History of cerebrovascular disease 09/24/2014  . History of recurrent UTIs   . Hypertension, benign   . Irritable bowel syndrome   . Low back pain syndrome   . Memory loss   . Mitral valve prolapse   . Paroxysmal A-fib ([place])   . Peripheral neuropathy    "both feet and legs"  . Physical deconditioning   .  Sjogren's syndrome ([place])   . Therapeutic opioid-induced constipation (OIC)   . Thyroid nodule   . Urinary incontinence    Past Surgical History:  Procedure Laterality Date  . ABDOMINAL HYSTERECTOMY  1967  . APPENDECTOMY    . CARDIAC CATHETERIZATION  02/17/2003   normal L main, LAD free of disease, Cfx free of disease, RCA free of disease (Dr. Loni Muse. [facility])  . CATARACT EXTRACTION, BILATERAL    . CHOLECYSTECTOMY  2000  . COLONOSCOPY W/  BIOPSIES     multiple  . DENTAL SURGERY     multiple tooth extractions  . ESOPHAGOGASTRODUODENOSCOPY (EGD) WITH ESOPHAGEAL DILATION N/A 08/23/2012   Procedure: ESOPHAGOGASTRODUODENOSCOPY (EGD) WITH ESOPHAGEAL DILATION;  Surgeon: Milus Banister, MD;  Location: [facility];  Service: Endoscopy;  Laterality: N/A;  . NASAL SEPTUM SURGERY  1980  . NM MYOCAR PERF WALL MOTION  2003   persantine - normal static and dynamic study w/apical thinning and presvered LV function, no ischemia  . SINUS EXPLORATION     ossifiying fibroma  . TEMPOROMANDIBULAR JOINT SURGERY  1986   Dr. Terence Lux  . TRANSTHORACIC ECHOCARDIOGRAM  2001   mild LVH, normal LV    Allergies  Allergen Reactions  . Banana Nausea And Vomiting  . Codeine Nausea Only    unless given with Phenergan  . Klonopin [Clonazepam] Other (See Comments)    Causes hallucination   . Meperidine Hcl Nausea Only    unless given with Phenergan  . Norflex [Orphenadrine Citrate] Nausea Only    Unless given with Phenergan  . Oxycodone-Acetaminophen Nausea Only    unless given with phenergan  . Propoxyphene Hcl Nausea Only    unless given with phenergan  . Zoloft [Sertraline Hcl] Other (See Comments)    Caused lethargy  . Doxycycline Other (See Comments)    Unknown reaction  . Naproxen Other (See Comments)    Unknown reaction  . Penicillins Other (See Comments)    Unknown reaction Has patient had a PCN reaction causing immediate rash, facial/tongue/throat swelling, SOB or lightheadedness with hypotension: Unknown Has patient had a PCN reaction causing severe rash involving mucus membranes or skin necrosis: Unknown Has patient had a PCN reaction that required hospitalization: pt was in the hospital at time of reaction Has patient had a PCN reaction occurring within the last 10 years: Unknown If all of the above answers are "NO", then may proceed with Cephalos  . Phenothiazines Other (See Comments)    Unknown reaction  . Stelazine Other  (See Comments)    Unknown reaction  . Sulfamethoxazole-Trimethoprim Other (See Comments)    Unknown reaction  . Tolectin [Tolmetin Sodium] Other (See Comments)    Unknown reaction  . Tramadol Other (See Comments)    Unknown reaction    Outpatient Encounter Medications as of 09/11/2018  Medication Sig  . acetaminophen (TYLENOL) 325 MG tablet Take 650 mg by mouth daily as needed for headache (pain).  Marland Kitchen ALPRAZolam (XANAX) 0.25 MG tablet Take 1 tablet (0.25 mg total) by mouth 2 (two) times daily as needed.  Marland Kitchen antiseptic oral rinse (BIOTENE) LIQD 15 mLs by Mouth Rinse route 5 (five) times daily as needed for dry mouth.  Marland Kitchen aspirin 325 MG tablet Take 1 tablet (325 mg total) by mouth daily.  . carboxymethylcellulose (REFRESH TEARS) 0.5 % SOLN Place 1 drop into both eyes 5 (five) times daily as needed (dry eyes).   Marland Kitchen diltiazem (CARTIA XT) 180 MG 24 hr capsule Take 1 capsule (180 mg total) by mouth  daily.  . DULoxetine (CYMBALTA) 30 MG capsule TAKE 1 CAPSULE BY MOUTH ONCE DAILY FOR BACK PAIN AND FOR DEPRESSION  . DULoxetine (CYMBALTA) 60 MG capsule Take 1 capsule by mouth once daily  . furosemide (LASIX) 40 MG tablet Take 1 tablet (40 mg total) by mouth daily.  . hydroxychloroquine (PLAQUENIL) 200 MG tablet Take 1 tablet by mouth once daily  . JANUVIA 100 MG tablet Take 1 tablet by mouth once daily  . lansoprazole (PREVACID) 30 MG capsule TAKE 1 CAPSULE BY MOUTH ONCE DAILY AT  12  NOON  . linaclotide (LINZESS) 290 MCG CAPS capsule TAKE 1 CAPSULE BY MOUTH EVERY DAY AT BEDTIME  . memantine (NAMENDA) 10 MG tablet Take 1 tablet by mouth twice daily  . MYRBETRIQ 50 MG TB24 tablet Take 1 tablet by mouth once daily  . nitrofurantoin, macrocrystal-monohydrate, (MACROBID) 100 MG capsule Take 100 mg by mouth 2 (two) times daily.  . ONE TOUCH LANCETS MISC Use to test blood sugar twice daily.  Dx: E11.9  . ONETOUCH VERIO test strip USE ONE STRIP TO CHECK GLUCOSE TWICE DAILY AS DIRECTED  . pilocarpine  (SALAGEN) 5 MG tablet Take 1 tablet by mouth twice daily  . polyethylene glycol (MIRALAX / GLYCOLAX) packet Take 17 g by mouth daily as needed (constipation). Mix in 8 oz liquid and drink  . potassium chloride SA (K-DUR) 20 MEQ tablet Take 1 tablet by mouth once daily  . pregabalin (LYRICA) 100 MG capsule Take 1 capsule by mouth twice daily  . senna (SENOKOT) 8.6 MG TABS tablet Take 1 tablet (8.6 mg total) by mouth daily.  . sodium chloride (OCEAN) 0.65 % SOLN nasal spray Place 1 spray into the nose as needed for congestion.   No facility-administered encounter medications on file as of 09/11/2018.     Review of Systems  Constitutional: Negative for appetite change, chills, fatigue and fever.  HENT: Negative for congestion, rhinorrhea, sinus pressure, sinus pain, sneezing and sore throat.   Eyes: Negative for discharge, redness and itching.  Respiratory: Negative for cough, chest tightness, shortness of breath and wheezing.   Cardiovascular: Negative for chest pain, palpitations and leg swelling.  Gastrointestinal: Positive for abdominal pain. Negative for constipation, diarrhea, nausea and vomiting.  Genitourinary: Negative for difficulty urinating, dyspareunia, dysuria, flank pain, frequency and urgency.  Musculoskeletal: Positive for arthralgias and gait problem.  Skin: Negative for color change, pallor, rash and wound.  Neurological: Positive for numbness. Negative for dizziness, weakness, light-headedness and headaches.       Bilateral lower extremities numbness and tingling   Psychiatric/Behavioral: Negative for agitation and sleep disturbance. The patient is not nervous/anxious.     Immunization History  Administered Date(s) Administered  . Influenza Split 12/26/2010, 01/15/2012  . Influenza Whole 12/30/2008, 11/15/2009  . Influenza, High Dose Seasonal PF 12/21/2016, 11/05/2017  . Influenza,inj,Quad PF,6+ Mos 02/04/2013, 11/03/2013, 12/05/2014, 12/13/2015  . Pneumococcal  Conjugate-13 06/12/2014  . Pneumococcal Polysaccharide-23 02/15/2009, 12/05/2014  . Tdap 12/08/2013  . Zoster 01/29/2012   Pertinent  Health Maintenance Due  Topic Date Due  . FOOT EXAM  07/04/1939  . OPHTHALMOLOGY EXAM  07/04/1939  . MAMMOGRAM  05/15/2018  . HEMOGLOBIN A1C  09/05/2018  . INFLUENZA VACCINE  09/14/2018  . DEXA SCAN  Completed  . PNA vac Low Risk Adult  Completed  . URINE MICROALBUMIN  Discontinued   Fall Risk  09/11/2018 07/11/2018 05/27/2018 03/07/2018 11/05/2017  Falls in the past year? 0 1 1 0 No  Number falls in past  yr: 0 0 1 0 -  Injury with Fall? 0 0 1 0 -  Comment - - - - -  Risk Factor Category  - - - - -  Risk for fall due to : - - - - -  Follow up - - - - -    Vitals:   09/11/18 1334  BP: 122/80  Pulse: 73  Temp: 97.7 F (36.5 C)  TempSrc: Oral  SpO2: 97%  Weight: 147 lb 9.6 oz (67 kg)  Height: 5' 3.5" (1.613 m)   Body mass index is 25.74 kg/m. Physical Exam Constitutional:      General: She is not in acute distress.    Appearance: She is overweight. She is not ill-appearing.  HENT:     Head: Normocephalic.     Right Ear: Tympanic membrane, ear canal and external ear normal. There is no impacted cerumen.     Left Ear: Tympanic membrane, ear canal and external ear normal. There is no impacted cerumen.     Nose: Nose normal. No congestion or rhinorrhea.     Mouth/Throat:     Mouth: Mucous membranes are moist.     Pharynx: Oropharynx is clear. No oropharyngeal exudate or posterior oropharyngeal erythema.  Eyes:     General: No scleral icterus.       Right eye: No discharge.        Left eye: No discharge.     Extraocular Movements: Extraocular movements intact.     Conjunctiva/sclera: Conjunctivae normal.     Pupils: Pupils are equal, round, and reactive to light.  Neck:     Musculoskeletal: Normal range of motion. No neck rigidity or muscular tenderness.  Cardiovascular:     Rate and Rhythm: Normal rate and regular rhythm.     Pulses:  Normal pulses.     Heart sounds: Normal heart sounds. No murmur. No friction rub. No gallop.   Pulmonary:     Effort: Pulmonary effort is normal. No respiratory distress.     Breath sounds: Normal breath sounds. No wheezing, rhonchi or rales.  Chest:     Chest wall: No tenderness.  Abdominal:     General: Bowel sounds are normal. There is no distension.     Palpations: Abdomen is soft. There is no mass.     Tenderness: There is abdominal tenderness in the right lower quadrant and left lower quadrant. There is no right CVA tenderness, left CVA tenderness, guarding or rebound. Positive signs include Murphy's sign.     Comments: Guarding abdomen on palpation   Musculoskeletal:        General: No swelling or tenderness.     Right lower leg: No edema.     Left lower leg: No edema.     Comments: Unsteady gait   Lymphadenopathy:     Cervical: No cervical adenopathy.  Skin:    General: Skin is warm and dry.     Coloration: Skin is not pale.     Findings: No bruising, erythema or rash.  Neurological:     Mental Status: She is alert and oriented to person, place, and time.     Cranial Nerves: No cranial nerve deficit.     Sensory: Sensory deficit present.     Motor: No weakness.     Gait: Gait abnormal.     Comments: Bilateral feet decreased sensation with monofilament test.   Psychiatric:        Mood and Affect: Mood normal.  Behavior: Behavior normal.        Thought Content: Thought content normal.        Judgment: Judgment normal.    Labs reviewed: Recent Labs    01/29/18 1408  NA 140  K 4.2  CL 102  CO2 28  GLUCOSE 84  BUN 27*  CREATININE 1.03*  CALCIUM 10.1   Recent Labs    01/29/18 1408  AST 21  ALT 13  BILITOT 0.3  PROT 7.3   Recent Labs    01/29/18 1408  WBC 7.6  NEUTROABS 4,438  HGB 12.6  HCT 38.8  MCV 92.8  PLT 225   Lab Results  Component Value Date   TSH 1.35 09/04/2017   Lab Results  Component Value Date   HGBA1C 6.0 (H) 03/07/2018    Lab Results  Component Value Date   CHOL 183 09/06/2017   HDL 63 09/06/2017   LDLCALC 99 09/06/2017   LDLDIRECT 65.7 07/08/2010   TRIG 113 09/06/2017   CHOLHDL 2.9 09/06/2017    Significant Diagnostic Results in last 30 days:  No results found.  Assessment/Plan 1. Peripheral polyneuropathy Ongoing symptoms.continue on Cymbalta 30 mg capsule daily and Lyrica 100 mg capsule daily.Add Capsaicin cream apply to feet twice daily for pain.  2. Right lower quadrant abdominal pain Guarding abdomen during exam. - US Abdomen Complete; Future  3. Left lower quadrant abdominal pain Guarding Abdomen during exam.Tender to palpation.  - US Abdomen Complete; Future  Family/ staff Communication: Reviewed plan of care with patient and care giver.   Labs/tests ordered:  - US Abdomen Complete; Future  Time spent with patient 25 minutes >50% time spent counseling; reviewing medical record; tests; labs; and developing future plan of care  Addendum: 09/17/2018 Unable to complete US per X-ray Tech CT scan recommended. CT abdomen W/Wo contrast ordered.  Sandrea Hughs, NP

## 2018-09-11 NOTE — Patient Instructions (Signed)
Apply Capsaicin topically to both feet twice daily for pain.

## 2018-09-12 DIAGNOSIS — Z1231 Encounter for screening mammogram for malignant neoplasm of breast: Secondary | ICD-10-CM | POA: Diagnosis not present

## 2018-09-12 LAB — HM MAMMOGRAPHY

## 2018-09-13 ENCOUNTER — Telehealth: Payer: Self-pay

## 2018-09-13 NOTE — Telephone Encounter (Signed)
[name] with [facility] called to question what Ariana [name] is looking for on U/S of Abdomen. Ultrasounds do not examine the appendix. If Ariana [name] would like the appendix examined she will need to cancel order for U/S and order a CT of Abdomen/Pelvis.  If Ariana [name] would like to proceed with the Ultrasound a new order will need to be placed with a different diagnosis.   Please review and advise

## 2018-09-13 NOTE — Telephone Encounter (Signed)
D/c U/S and order CT scan of abdomen.

## 2018-09-13 NOTE — Telephone Encounter (Signed)
I called Dewayne Hatch and informed her that order was placed for U/S complete. Dewayne Hatch reiterated that an U/S will not capture the appendix and if Webb Silversmith would like to r/o abnormalities with the appendix she will need to order a CT and not a U/S.

## 2018-09-17 ENCOUNTER — Encounter: Payer: Self-pay | Admitting: *Deleted

## 2018-09-17 DIAGNOSIS — R3915 Urgency of urination: Secondary | ICD-10-CM | POA: Diagnosis not present

## 2018-09-17 DIAGNOSIS — N3941 Urge incontinence: Secondary | ICD-10-CM | POA: Diagnosis not present

## 2018-09-17 NOTE — Telephone Encounter (Signed)
CT scan Abdomen with or without contrast ordered.

## 2018-09-17 NOTE — Telephone Encounter (Signed)
Message is still open, it appears appointment for U/S was canceled. Dinah please place order if you would like for patient to get CT

## 2018-09-17 NOTE — Addendum Note (Signed)
Addended byMarlowe Sax C on: 09/17/2018 09:57 AM   Modules accepted: Orders

## 2018-09-18 DIAGNOSIS — Z6826 Body mass index (BMI) 26.0-26.9, adult: Secondary | ICD-10-CM | POA: Diagnosis not present

## 2018-09-18 DIAGNOSIS — Z01419 Encounter for gynecological examination (general) (routine) without abnormal findings: Secondary | ICD-10-CM | POA: Diagnosis not present

## 2018-09-18 DIAGNOSIS — M8588 Other specified disorders of bone density and structure, other site: Secondary | ICD-10-CM | POA: Diagnosis not present

## 2018-09-18 DIAGNOSIS — N958 Other specified menopausal and perimenopausal disorders: Secondary | ICD-10-CM | POA: Diagnosis not present

## 2018-09-20 ENCOUNTER — Other Ambulatory Visit: Payer: Self-pay | Admitting: Internal Medicine

## 2018-09-20 ENCOUNTER — Other Ambulatory Visit: Payer: Self-pay | Admitting: *Deleted

## 2018-09-20 ENCOUNTER — Other Ambulatory Visit: Payer: Medicare Other

## 2018-09-20 MED ORDER — ALPRAZOLAM 0.25 MG PO TABS: 0.2500 mg | ORAL_TABLET | Freq: Two times a day (BID) | ORAL | 0 refills | Status: DC | PRN

## 2018-09-20 NOTE — Telephone Encounter (Signed)
Patient daughter requested refill.  

## 2018-09-26 ENCOUNTER — Other Ambulatory Visit: Payer: Self-pay | Admitting: Rheumatology

## 2018-09-26 ENCOUNTER — Other Ambulatory Visit: Payer: Self-pay | Admitting: Internal Medicine

## 2018-09-26 DIAGNOSIS — M545 Low back pain, unspecified: Secondary | ICD-10-CM

## 2018-09-26 DIAGNOSIS — M3509 Sicca syndrome with other organ involvement: Secondary | ICD-10-CM

## 2018-09-26 DIAGNOSIS — G6289 Other specified polyneuropathies: Secondary | ICD-10-CM

## 2018-09-26 NOTE — Telephone Encounter (Signed)
Last filled 08/30/2018 

## 2018-09-26 NOTE — Telephone Encounter (Signed)
Last Visit: 01/29/18 Next Visit: 01/30/19 Labs: 01/29/2018 CBC WNL. Creatinine is elevated and GFR is low Eye exam: 10/10/2017  Spoke with patient's daughter. She states her mother has not gone anywhere the last few weeks due to not feeling well due to recurrent UTI's. Unable to update labs at this time.   Okay to refill Plaquenil?

## 2018-09-26 NOTE — Telephone Encounter (Signed)
Patient should hold Plaquenil if she has UTI.  Once her urinary tract infection is cleared then we can give her Plaquenil.

## 2018-09-27 NOTE — Telephone Encounter (Signed)
Attempted to contact the patient and unable to leave a message. Memory full for voicemail.

## 2018-09-30 NOTE — Telephone Encounter (Signed)
Patient's daughter advised patient should hold Plaquenil if she has UTI.  Once her urinary tract infection is cleared then we can give her Plaquenil.

## 2018-10-08 DIAGNOSIS — R3915 Urgency of urination: Secondary | ICD-10-CM | POA: Diagnosis not present

## 2018-10-08 DIAGNOSIS — R300 Dysuria: Secondary | ICD-10-CM | POA: Diagnosis not present

## 2018-10-08 DIAGNOSIS — N3941 Urge incontinence: Secondary | ICD-10-CM | POA: Diagnosis not present

## 2018-10-12 ENCOUNTER — Other Ambulatory Visit: Payer: Self-pay | Admitting: Internal Medicine

## 2018-10-16 NOTE — Addendum Note (Signed)
Addended byMarlowe Sax C on: 10/16/2018 03:34 PM   Modules accepted: Orders

## 2018-10-17 ENCOUNTER — Other Ambulatory Visit: Payer: Medicare Other

## 2018-10-18 ENCOUNTER — Other Ambulatory Visit: Payer: Self-pay | Admitting: *Deleted

## 2018-10-18 MED ORDER — ALPRAZOLAM 0.25 MG PO TABS: 0.2500 mg | ORAL_TABLET | Freq: Two times a day (BID) | ORAL | 0 refills | Status: DC | PRN

## 2018-10-18 NOTE — Telephone Encounter (Signed)
Patient daughter requested [place] Verified LR: 09/20/2018 Pended Rx and sent to [name] for approval due to [place].

## 2018-10-29 ENCOUNTER — Ambulatory Visit
Admission: RE | Admit: 2018-10-29 | Discharge: 2018-10-29 | Disposition: A | Discharge status: Home / Self Care | Payer: Medicare Other | Source: Ambulatory Visit | Attending: Family | Admitting: Family

## 2018-10-29 DIAGNOSIS — R1032 Left lower quadrant pain: Secondary | ICD-10-CM

## 2018-10-29 DIAGNOSIS — R1031 Right lower quadrant pain: Secondary | ICD-10-CM

## 2018-10-29 DIAGNOSIS — K5730 Diverticulosis of large intestine without perforation or abscess without bleeding: Secondary | ICD-10-CM | POA: Diagnosis not present

## 2018-10-29 MED ORDER — IOPAMIDOL (ISOVUE-300) INJECTION 61%
100.0000 mL | Freq: Once | INTRAVENOUS | Status: AC | PRN
  Administered 2018-10-29: 100 mL via INTRAVENOUS

## 2018-10-31 ENCOUNTER — Other Ambulatory Visit: Payer: Self-pay | Admitting: Rheumatology

## 2018-10-31 ENCOUNTER — Other Ambulatory Visit: Payer: Self-pay | Admitting: Internal Medicine

## 2018-10-31 DIAGNOSIS — M545 Low back pain, unspecified: Secondary | ICD-10-CM

## 2018-10-31 DIAGNOSIS — F341 Dysthymic disorder: Secondary | ICD-10-CM

## 2018-10-31 DIAGNOSIS — M797 Fibromyalgia: Secondary | ICD-10-CM

## 2018-10-31 DIAGNOSIS — G6289 Other specified polyneuropathies: Secondary | ICD-10-CM

## 2018-10-31 NOTE — Telephone Encounter (Signed)
Last Visit: 01/29/2018 Next Visit: 01/30/2019  Okay to refill per Dr. Deveshwar. 

## 2018-11-01 ENCOUNTER — Other Ambulatory Visit: Payer: Self-pay

## 2018-11-01 ENCOUNTER — Telehealth: Payer: Self-pay | Admitting: Internal Medicine

## 2018-11-01 ENCOUNTER — Ambulatory Visit: Payer: Medicare Other | Admitting: Family

## 2018-11-01 NOTE — Telephone Encounter (Signed)
I called and spoke with patients daughter about the medication Cymbalta which is on patients medication list that  she is taking a 30 mg and a 60 mg tablet the daughter said she has been taking this medication like this for 8 years since it is the only way that her mothers insurance will pay for the medication  Prescription sent to provider for approval

## 2018-11-01 NOTE — Telephone Encounter (Signed)
pts daughter(Kaiden)  called to cancel mom(Ariana [name]) appt today 11/01/18. She wanted to e

## 2018-11-05 DIAGNOSIS — R300 Dysuria: Secondary | ICD-10-CM | POA: Diagnosis not present

## 2018-11-05 DIAGNOSIS — N3941 Urge incontinence: Secondary | ICD-10-CM | POA: Diagnosis not present

## 2018-11-08 ENCOUNTER — Other Ambulatory Visit: Payer: Self-pay

## 2018-11-08 ENCOUNTER — Encounter: Payer: Self-pay | Admitting: Family

## 2018-11-08 ENCOUNTER — Other Ambulatory Visit: Payer: Self-pay | Admitting: Internal Medicine

## 2018-11-08 ENCOUNTER — Ambulatory Visit: Payer: Self-pay

## 2018-11-08 ENCOUNTER — Ambulatory Visit (INDEPENDENT_AMBULATORY_CARE_PROVIDER_SITE_OTHER): Payer: Medicare Other | Admitting: Family

## 2018-11-08 VITALS — BP 116/72 | HR 67 | Temp 97.7°F | Ht 63.5 in | Wt 149.6 lb

## 2018-11-08 DIAGNOSIS — Z0000 Encounter for general adult medical examination without abnormal findings: Secondary | ICD-10-CM

## 2018-11-08 DIAGNOSIS — Z23 Encounter for immunization: Secondary | ICD-10-CM

## 2018-11-08 DIAGNOSIS — E119 Type 2 diabetes mellitus without complications: Secondary | ICD-10-CM

## 2018-11-08 MED ORDER — SITAGLIPTIN PHOSPHATE 100 MG PO TABS: 100.0000 mg | ORAL_TABLET | Freq: Every day | ORAL | 1 refills | Status: DC

## 2018-11-08 MED ORDER — FUROSEMIDE 40 MG PO TABS: 40.0000 mg | ORAL_TABLET | Freq: Every day | ORAL | 1 refills | Status: DC

## 2018-11-08 NOTE — Patient Instructions (Signed)
Ariana [name] , Thank you for taking time to come for your Medicare Wellness Visit. I appreciate your ongoing commitment to your health goals. Please review the following plan we discussed and let me know if I can assist you in the future.   Screening recommendations/referrals: Colonoscopy : Age out  Mammogram: Up to date  Bone Density: Up to date  Recommended yearly ophthalmology/optometry visit for glaucoma screening and checkup Recommended yearly dental visit for hygiene and checkup  Vaccinations: Influenza vaccine:  Pneumococcal vaccine : Up to date  Tdap vaccine : Due 12/08/2020  Shingles vaccine: Get shingrix vaccine at the pharmacy     Advanced directives: Yes   Conditions/risks identified: Advance age female> 40 yrs,Hypertension,Type DM   Next appointment: 1 Yr    Preventive Care 68 Years and Older, Female Preventive care refers to lifestyle choices and visits with your health care provider that can promote health and wellness. What does preventive care include?  A yearly physical exam. This is also called an annual well check.  Dental exams once or twice a year.  Routine eye exams. Ask your health care provider how often you should have your eyes checked.  Personal lifestyle choices, including:  Daily care of your teeth and gums.  Regular physical activity.  Eating a healthy diet.  Avoiding tobacco and drug use.  Limiting alcohol use.  Practicing safe sex.  Taking low-dose aspirin every day.  Taking vitamin and mineral supplements as recommended by your health care provider. What happens during an annual well check? The services and screenings done by your health care provider during your annual well check will depend on your age, overall health, lifestyle risk factors, and family history of disease. Counseling  Your health care provider may ask you questions about your:  Alcohol use.  Tobacco use.  Drug use.  Emotional well-being.  Home and  relationship well-being.  Sexual activity.  Eating habits.  History of falls.  Memory and ability to understand (cognition).  Work and work Statistician.  Reproductive health. Screening  You may have the following tests or measurements:  Height, weight, and BMI.  Blood pressure.  Lipid and cholesterol levels. These may be checked every 5 years, or more frequently if you are over 9 years old.  Skin check.  Lung cancer screening. You may have this screening every year starting at age 54 if you have a 30-pack-year history of smoking and currently smoke or have quit within the past 15 years.  Fecal occult blood test (FOBT) of the stool. You may have this test every year starting at age 74.  Flexible sigmoidoscopy or colonoscopy. You may have a sigmoidoscopy every 5 years or a colonoscopy every 10 years starting at age 65.  Hepatitis C blood test.  Hepatitis B blood test.  Sexually transmitted disease (STD) testing.  Diabetes screening. This is done by checking your blood sugar (glucose) after you have not eaten for a while (fasting). You may have this done every 1-3 years.  Bone density scan. This is done to screen for osteoporosis. You may have this done starting at age 35.  Mammogram. This may be done every 1-2 years. Talk to your health care provider about how often you should have regular mammograms. Talk with your health care provider about your test results, treatment options, and if necessary, the need for more tests. Vaccines  Your health care provider may recommend certain vaccines, such as:  Influenza vaccine. This is recommended every year.  Tetanus, diphtheria, and  acellular pertussis (Tdap, Td) vaccine. You may need a Td booster every 10 years.  Zoster vaccine. You may need this after age 55.  Pneumococcal 13-valent conjugate (PCV13) vaccine. One dose is recommended after age 62.  Pneumococcal polysaccharide (PPSV23) vaccine. One dose is recommended after  age 33. Talk to your health care provider about which screenings and vaccines you need and how often you need them. This information is not intended to replace advice given to you by your health care provider. Make sure you discuss any questions you have with your health care provider. Document Released: 02/26/2015 Document Revised: 10/20/2015 Document Reviewed: 12/01/2014 Elsevier Interactive Patient Education  2017 [place] Prevention in the Home Falls can cause injuries. They can happen to people of all ages. There are many things you can do to make your home safe and to help prevent falls. What can I do on the outside of my home?  Regularly fix the edges of walkways and driveways and fix any cracks.  Remove anything that might make you trip as you walk through a door, such as a raised step or threshold.  Trim any bushes or trees on the path to your home.  Use bright outdoor lighting.  Clear any walking paths of anything that might make someone trip, such as rocks or tools.  Regularly check to see if handrails are loose or broken. Make sure that both sides of any steps have handrails.  Any raised decks and porches should have guardrails on the edges.  Have any leaves, snow, or ice cleared regularly.  Use sand or salt on walking paths during winter.  Clean up any spills in your garage right away. This includes oil or grease spills. What can I do in the bathroom?  Use night lights.  Install grab bars by the toilet and in the tub and shower. Do not use towel bars as grab bars.  Use non-skid mats or decals in the tub or shower.  If you need to sit down in the shower, use a plastic, non-slip stool.  Keep the floor dry. Clean up any water that spills on the floor as soon as it happens.  Remove soap buildup in the tub or shower regularly.  Attach bath mats securely with double-sided non-slip rug tape.  Do not have throw rugs and other things on the floor that can  make you trip. What can I do in the bedroom?  Use night lights.  Make sure that you have a light by your bed that is easy to reach.  Do not use any sheets or blankets that are too big for your bed. They should not hang down onto the floor.  Have a firm chair that has side arms. You can use this for support while you get dressed.  Do not have throw rugs and other things on the floor that can make you trip. What can I do in the kitchen?  Clean up any spills right away.  Avoid walking on wet floors.  Keep items that you use a lot in easy-to-reach places.  If you need to reach something above you, use a strong step stool that has a grab bar.  Keep electrical cords out of the way.  Do not use floor polish or wax that makes floors slippery. If you must use wax, use non-skid floor wax.  Do not have throw rugs and other things on the floor that can make you trip. What can I do with my stairs?  Do not leave any items on the stairs.  Make sure that there are handrails on both sides of the stairs and use them. Fix handrails that are broken or loose. Make sure that handrails are as long as the stairways.  Check any carpeting to make sure that it is firmly attached to the stairs. Fix any carpet that is loose or worn.  Avoid having throw rugs at the top or bottom of the stairs. If you do have throw rugs, attach them to the floor with carpet tape.  Make sure that you have a light switch at the top of the stairs and the bottom of the stairs. If you do not have them, ask someone to add them for you. What else can I do to help prevent falls?  Wear shoes that:  Do not have high heels.  Have rubber bottoms.  Are comfortable and fit you well.  Are closed at the toe. Do not wear sandals.  If you use a stepladder:  Make sure that it is fully opened. Do not climb a closed stepladder.  Make sure that both sides of the stepladder are locked into place.  Ask someone to hold it for you,  if possible.  Clearly mark and make sure that you can see:  Any grab bars or handrails.  First and last steps.  Where the edge of each step is.  Use tools that help you move around (mobility aids) if they are needed. These include:  Canes.  Walkers.  Scooters.  Crutches.  Turn on the lights when you go into a dark area. Replace any light bulbs as soon as they burn out.  Set up your furniture so you have a clear path. Avoid moving your furniture around.  If any of your floors are uneven, fix them.  If there are any pets around you, be aware of where they are.  Review your medicines with your doctor. Some medicines can make you feel dizzy. This can increase your chance of falling. Ask your doctor what other things that you can do to help prevent falls. This information is not intended to replace advice given to you by your health care provider. Make sure you discuss any questions you have with your health care provider. Document Released: 11/26/2008 Document Revised: 07/08/2015 Document Reviewed: 03/06/2014 Elsevier Interactive Patient Education  2017 [employer].

## 2018-11-08 NOTE — Progress Notes (Addendum)
Subjective:   Ariana [name] is a 85 y.o. female who presents for Medicare Annual (Subsequent) preventive examination.  Review of Systems:  Cardiac Risk Factors include: advanced age (>29men, >49 women);diabetes mellitus;hypertension     Objective:     Vitals: BP 116/72    Pulse 67    Temp 97.7 F (36.5 C) (Temporal)    Ht 5' 3.5" (1.613 m)    Wt 149 lb 9.6 oz (67.9 kg)    SpO2 97%    BMI 26.08 kg/m   Body mass index is 26.08 kg/m.  Advanced Directives 11/08/2018 11/05/2017 09/04/2017 06/11/2017 05/07/2017 04/24/2017 02/15/2017  Does Patient Have a Medical Advance Directive? No Yes Yes Yes Yes Yes No  Type of Advance Directive - Healthcare Power of [place];Living will Healthcare Power of [place] -  Does patient want to make changes to medical advance directive? - No - Patient declined No - Patient declined No - Patient declined No - Patient declined No - Patient declined -  Copy of [place] in Chart? - Yes No - copy requested Yes Yes Yes -  Would patient like information on creating a medical advance directive? No - Patient declined - - - - - -  Pre-existing out of facility DNR order (yellow form or pink MOST form) - - - - - - -    Tobacco Social History   Tobacco Use  Smoking Status Former Smoker  Smokeless Tobacco Never Used  Tobacco Comment   Quit at age 53      Counseling given: Not Answered Comment: Quit at age 20    Clinical Intake:  Pre-visit preparation completed: No  Pain : 0-10 Pain Type: Neuropathic pain Pain Location: Foot Pain Orientation: (bilateral) Pain Radiating Towards: No Pain Descriptors / Indicators: Burning, Constant Pain Onset: (several years) Pain Frequency: Constant Pain Relieving Factors: None Effect of Pain on Daily Activities: walking  Pain Relieving Factors: None  BMI - recorded: 26.08  How often do you need to have someone help  you when you read instructions, pamphlets, or other written materials from your doctor or pharmacy?: 1 - Never What is the last grade level you completed in school?: 12 grade  Interpreter Needed?: No  Information entered by :: Arnita Koons FNP-C  Past Medical History:  Diagnosis Date   Abnormality of gait    Adenomatous polyp of colon 2002   59mm   Allergic rhinitis    Anxiety    Anxiety and depression    Chronic back pain    Dementia without behavioral disturbance ([place])    Depression    Diverticulosis of colon    Dry eye syndrome    Dysphagia    Dysthymic disorder    Fibromyalgia    GERD (gastroesophageal reflux disease)    H/O hiatal hernia    History of adverse drug reaction    History of cerebrovascular disease 09/24/2014   History of recurrent UTIs    Hypertension, benign    Irritable bowel syndrome    Low back pain syndrome    Memory loss    Mitral valve prolapse    Paroxysmal A-fib (HCC)    Peripheral neuropathy    "both feet and legs"   Physical deconditioning    Sjogren's syndrome (HCC)    Therapeutic opioid-induced constipation ([facility])    Thyroid nodule    Urinary incontinence    Past Surgical History:  Procedure Laterality  Date   ABDOMINAL HYSTERECTOMY  1967   APPENDECTOMY     CARDIAC CATHETERIZATION  02/17/2003   normal L main, LAD free of disease, Cfx free of disease, RCA free of disease (Dr. Loni Muse. Little)   CATARACT EXTRACTION, BILATERAL     CHOLECYSTECTOMY  2000   COLONOSCOPY W/ BIOPSIES     multiple   DENTAL SURGERY     multiple tooth extractions   ESOPHAGOGASTRODUODENOSCOPY (EGD) WITH ESOPHAGEAL DILATION N/A 08/23/2012   Procedure: ESOPHAGOGASTRODUODENOSCOPY (EGD) WITH ESOPHAGEAL DILATION;  Surgeon: Milus Banister, MD;  Location: [facility];  Service: Endoscopy;  Laterality: N/A;   NASAL SEPTUM SURGERY  1980   NM MYOCAR PERF WALL MOTION  2003   persantine - normal static and dynamic study w/apical thinning  and presvered LV function, no ischemia   SINUS EXPLORATION     ossifiying fibroma   TEMPOROMANDIBULAR JOINT SURGERY  1986   Dr. Terence Lux   TRANSTHORACIC ECHOCARDIOGRAM  2001   mild LVH, normal LV   Family History  Problem Relation Age of Onset   Heart disease Father        heart attack   Pneumonia Father    Heart attack Mother    Hypertension Mother    Colon cancer Sister    Kidney disease Daughter    Asthma Daughter    Arthritis Daughter 24       osteo,   Heart disease Son 24       stage 3 CHF(Diastolic /Systolic)   Throat cancer Brother    Hypertension Maternal Grandmother    Social History   Socioeconomic History   Marital status: Widowed    Spouse name: Not on file   Number of children: 2   Years of education: 67   Highest education level: Not on file  Occupational History   Occupation: Retired  Scientist, product/process development strain: Not hard at [employer] insecurity    Worry: Never true    Inability: Never true   Transportation needs    Medical: No    Non-medical: No  Tobacco Use   Smoking status: Former Smoker   Smokeless tobacco: Never Used   Tobacco comment: Quit at age 48   Substance and Sexual Activity   Alcohol use: No   Drug use: Never   Sexual activity: Never  Lifestyle   Physical activity    Days per week: 0 days    Minutes per session: 0 min   Stress: Not at all  Relationships   Social connections    Talks on phone: Three times a week    Gets together: Three times a week    Attends religious service: Never    Active member of club or organization: No    Attends meetings of clubs or organizations: Never    Relationship status: Widowed  Other Topics Concern   Not on file  Social History Narrative   Patient lives at home 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    Patient is Widowed.    Patient has 2 children.    Patient is retired.    Former smoker   Alcohol none   Exercise Walk, exercise chair 4 days a  week   POA    Walks with cane      Patient drinks about 1-2 cups of hot tea daily.   Patient is right handed.                Outpatient Encounter Medications  as of 11/08/2018  Medication Sig   acetaminophen (TYLENOL) 325 MG tablet Take 650 mg by mouth daily as needed for headache (pain).   ALPRAZolam (XANAX) 0.25 MG tablet Take 1 tablet (0.25 mg total) by mouth 2 (two) times daily as needed.   antiseptic oral rinse (BIOTENE) LIQD 15 mLs by Mouth Rinse route 5 (five) times daily as needed for dry mouth.   aspirin 325 MG tablet Take 1 tablet (325 mg total) by mouth daily.   Capsaicin-Menthol-Methyl Sal (CAPSAICIN-METHYL SAL-MENTHOL) 0.025-1-12 % CREA Apply 1 application topically 2 (two) times daily.   carboxymethylcellulose (REFRESH TEARS) 0.5 % SOLN Place 1 drop into both eyes 5 (five) times daily as needed (dry eyes).    CRANBERRY PO Take 2 tablets by mouth daily.   diltiazem (CARTIA XT) 180 MG 24 hr capsule Take 1 capsule (180 mg total) by mouth daily.   DULoxetine (CYMBALTA) 30 MG capsule TAKE 1 CAPSULE BY MOUTH ONCE DAILY FOR BACK PAIN AND FOR DEPRESSION   DULoxetine (CYMBALTA) 60 MG capsule Take 1 capsule by mouth once daily   furosemide (LASIX) 40 MG tablet Take 1 tablet (40 mg total) by mouth daily.   hydroxychloroquine (PLAQUENIL) 200 MG tablet Take 1 tablet by mouth once daily   lansoprazole (PREVACID) 30 MG capsule TAKE 1 CAPSULE BY MOUTH ONCE DAILY AT  12  NOON   LINZESS 290 MCG CAPS capsule TAKE 1 CAPSULE BY MOUTH EVERY DAY AT BEDTIME   memantine (NAMENDA) 10 MG tablet Take 1 tablet by mouth twice daily   MYRBETRIQ 50 MG TB24 tablet Take 1 tablet by mouth once daily   ONE TOUCH LANCETS MISC Use to test blood sugar twice daily.  Dx: E11.9   ONETOUCH VERIO test strip USE ONE STRIP TO CHECK GLUCOSE TWICE DAILY AS DIRECTED   pilocarpine (SALAGEN) 5 MG tablet Take 1 tablet by mouth twice daily   polyethylene glycol (MIRALAX / GLYCOLAX) packet Take 17 g by  mouth daily as needed (constipation). Mix in 8 oz liquid and drink   potassium chloride SA (K-DUR) 20 MEQ tablet Take 1 tablet by mouth once daily   pregabalin (LYRICA) 100 MG capsule Take 1 capsule by mouth twice daily   senna (SENOKOT) 8.6 MG TABS tablet Take 1 tablet (8.6 mg total) by mouth daily.   sitaGLIPtin (JANUVIA) 100 MG tablet Take 1 tablet (100 mg total) by mouth daily.   sodium chloride (OCEAN) 0.65 % SOLN nasal spray Place 1 spray into the nose as needed for congestion.   [DISCONTINUED] furosemide (LASIX) 40 MG tablet Take 1 tablet by mouth once daily   [DISCONTINUED] JANUVIA 100 MG tablet Take 1 tablet by mouth once daily   No facility-administered encounter medications on file as of 11/08/2018.     Activities of Daily Living In your present state of health, do you have any difficulty performing the following activities: 11/08/2018  Hearing? N  Vision? N  Difficulty concentrating or making decisions? Y  Comment gets easily side tract.  Walking or climbing stairs? Y  Comment uses cane  Dressing or bathing? N  Doing errands, shopping? Y  Comment assistance with driving  Preparing Food and eating ? N  Using the Toilet? N  Do you have problems with loss of bowel control? N  Managing your Medications? N  Managing your Finances? Y  Comment daughter Pharmacist, hospital? Y  Comment needs assistance  Some recent data might be hidden    Patient  Care Team: Gayland Curry, DO as PCP - General (Geriatric Medicine) Noralee Space, MD as Consulting Physician (Pulmonary Disease) Maisie Fus, MD as Consulting Physician (Obstetrics and Gynecology) Debara Pickett Nadean Corwin, MD as Consulting Physician (Cardiology) Monna Fam, MD as Consulting Physician (Ophthalmology) Kathrynn Ducking, MD as Consulting Physician (Neurology) Franchot Gallo, MD as Consulting Physician (Urology) Lavonna Monarch, MD as Consulting Physician  (Dermatology) Gatha Mayer, MD as Consulting Physician (Gastroenterology)    Assessment:   This is a routine wellness examination for Jazelynn.  Exercise Activities and Dietary recommendations Current Exercise Habits: Home exercise routine, Type of exercise: strength training/weights, Time (Minutes): 10, Frequency (Times/Week): 2, Weekly Exercise (Minutes/Week): 20, Intensity: Mild, Exercise limited by: Other - see comments(peripheral Neuropathy)  Goals   None     Fall Risk Fall Risk  11/08/2018 09/11/2018 07/11/2018 05/27/2018 03/07/2018  Falls in the past year? 1 0 1 1 0  Number falls in past yr: 1 0 0 1 0  Injury with Fall? 0 0 0 1 0  Comment - - - - -  Risk Factor Category  - - - - -  Risk for fall due to : History of fall(s);Impaired balance/gait - - - -  Follow up - - - - -   Is the patient's home free of loose throw rugs in walkways, pet beds, electrical cords, etc?   no      Grab bars in the bathroom? yes      Handrails on the stairs?   no steps       Adequate lighting?   yes  Depression Screen PHQ 2/9 Scores 11/08/2018 07/11/2018 05/27/2018 03/07/2018  PHQ - 2 Score 0 0 0 0  PHQ- 9 Score - - - -  Exception Documentation - - - -     Cognitive Function MMSE - Mini Mental State Exam 11/08/2018 11/05/2017 04/10/2016 10/06/2015 10/06/2015  Orientation to time 4 4 3 3  -  Orientation to Place 4 5 5 5  -  Registration 3 3 3 3 3   Attention/ Calculation 5 5 5 5 5   Recall 0 0 1 3 -  Language- name 2 objects 2 2 2 2  -  Language- repeat 1 1 1 1  -  Language- follow 3 step command 3 3 1 3  -  Language- read & follow direction 1 1 1 1  -  Write a sentence 1 1 0 1 -  Copy design 1 1 1 1  -  Total score 25 26 23 28  -        Immunization History  Administered Date(s) Administered   Influenza Split 12/26/2010, 01/15/2012   Influenza Whole 12/30/2008, 11/15/2009   Influenza, High Dose Seasonal PF 12/21/2016, 11/05/2017   Influenza,inj,Quad PF,6+ Mos 02/04/2013, 11/03/2013,  12/05/2014, 12/13/2015   Pneumococcal Conjugate-13 06/12/2014   Pneumococcal Polysaccharide-23 02/15/2009, 12/05/2014   Tdap 12/08/2013   Zoster 01/29/2012    Qualifies for Shingles Vaccine? Ordered this visit.   Screening Tests Health Maintenance  Topic Date Due   FOOT EXAM  07/04/1939   OPHTHALMOLOGY EXAM  07/04/1939   HEMOGLOBIN A1C  09/05/2018   INFLUENZA VACCINE  09/14/2018   MAMMOGRAM  09/12/2019   TETANUS/TDAP  12/09/2023   DEXA SCAN  Completed   PNA vac Low Risk Adult  Completed   URINE MICROALBUMIN  Discontinued    Cancer Screenings: Lung: Low Dose CT Chest recommended if Age 29-80 years, 30 pack-year currently smoking OR have quit w/in 15years. Patient does not qualify. Breast:  Up to  date on Mammogram? Yes   Up to date of Bone Density/Dexa? Yes Colorectal: Age out   Additional Screenings:  Hepatitis C Screening: Low Risk     Plan:   - Opthalmology for annual eye exam  - Podiatry for annual foot exam  - Influenza vaccine administered.   I have personally reviewed and noted the following in the patients chart:    Medical and social history  Use of alcohol, tobacco or illicit drugs   Current medications and supplements  Functional ability and status  Nutritional status  Physical activity  Advanced directives  List of other physicians  Hospitalizations, surgeries, and ER visits in previous 12 months  Vitals  Screenings to include cognitive, depression, and falls  Referrals and appointments  In addition, I have reviewed and discussed with patient certain preventive protocols, quality metrics, and best practice recommendations. A written personalized care plan for preventive services as well as general preventive health recommendations were provided to patient.    Sandrea Hughs, NP  11/08/2018

## 2018-11-19 ENCOUNTER — Other Ambulatory Visit: Payer: Self-pay | Admitting: *Deleted

## 2018-11-19 ENCOUNTER — Other Ambulatory Visit: Payer: Self-pay | Admitting: Internal Medicine

## 2018-11-19 MED ORDER — ALPRAZOLAM 0.25 MG PO TABS: 0.2500 mg | ORAL_TABLET | Freq: Two times a day (BID) | ORAL | 0 refills | Status: DC | PRN

## 2018-11-19 NOTE — Telephone Encounter (Signed)
[place], daughter requested refill [place] Verified LR: 10/18/18 Pended Rx and sent to [name] for approval.

## 2018-11-25 ENCOUNTER — Encounter: Payer: Self-pay | Admitting: Family

## 2018-11-25 ENCOUNTER — Other Ambulatory Visit: Payer: Self-pay

## 2018-11-25 ENCOUNTER — Ambulatory Visit (INDEPENDENT_AMBULATORY_CARE_PROVIDER_SITE_OTHER): Payer: Medicare Other | Admitting: Family

## 2018-11-25 DIAGNOSIS — R519 Headache, unspecified: Secondary | ICD-10-CM | POA: Diagnosis not present

## 2018-11-25 DIAGNOSIS — K582 Mixed irritable bowel syndrome: Secondary | ICD-10-CM | POA: Diagnosis not present

## 2018-11-25 DIAGNOSIS — G629 Polyneuropathy, unspecified: Secondary | ICD-10-CM

## 2018-11-25 MED ORDER — TOPIRAMATE 25 MG PO TABS: 25.0000 mg | ORAL_TABLET | Freq: Every day | ORAL | 0 refills | Status: DC

## 2018-11-25 NOTE — Progress Notes (Signed)
This service is provided via telemedicine  No vital signs collected/recorded due to the encounter was a telemedicine visit.   Location of patient (ex: home, work):  Home  Patient consents to a telephone visit:  Yes  Location of the provider (ex: office, home):  Office  Name of any referring provider:  Hollace Kinnier, DO  Names of all persons participating in the telemedicine service and their role in the encounter: Bonney Leitz, Gully; [name], NP, patient, Amy, cargiver.  Time spent on call:  14:02 minutes CMA time only   Location:      Place of Service:    Provider: Maytal Mijangos FNP-C  Gayland Curry, DO  Patient Care Team: Gayland Curry, DO as PCP - General (Geriatric Medicine) Noralee Space, MD as Consulting Physician (Pulmonary Disease) Maisie Fus, MD as Consulting Physician (Obstetrics and Gynecology) Debara Pickett Nadean Corwin, MD as Consulting Physician (Cardiology) Monna Fam, MD as Consulting Physician (Ophthalmology) Kathrynn Ducking, MD as Consulting Physician (Neurology) Franchot Gallo, MD as Consulting Physician (Urology) Lavonna Monarch, MD as Consulting Physician (Dermatology) Gatha Mayer, MD as Consulting Physician (Gastroenterology)  Extended Emergency Contact Information Primary Emergency Contact: Era Skeen Address: [address]          [place], [place] [address] Johnnette Litter of [place] Phone: [phone] Mobile Phone: [phone] Relation: Daughter Secondary Emergency Contact: Arvin Collard [place] of [place] Mobile Phone: [phone] Relation: Other  Code Status:  Goals of care: Advanced Directive information Advanced Directives 11/25/2018  Does Patient Have a Medical Advance Directive? (No Data)  Type of Advance Directive -  Does patient want to make changes to medical advance directive? -  Copy of [place] in Chart? -  Would patient like information on creating a medical  advance directive? No - Patient declined  Pre-existing out of facility DNR order (yellow form or pink MOST form) -     Chief Complaint  Patient presents with  . Acute Visit    Televisit for complaints of headache on the right, caregiver states BP has been low; complains of neuropathy making it difficult to talk.   . Quality Metric Gaps    A1c    HPI:  Pt is a 85 y.o. female seen today at [facility] office  for an acute visit for evaluation of headache on the right side.she has had headache x 1 week.she describes headache feels like being " Hit with a baseball bart".she wakes up with Headaches in the morning.she denies any blurry vision but states has had  changes in her vision.she had an Ophthalmology appointment that rescheduled by her daughter to a later date.  On going neuropathy issues on her fingers and feet which she takes Lyrica.she is unable to check blood sugars.Has care giver.  Also states has ongoing issues with constipation and sometimes diarrhea.she has seen GI in the past and would like referral for evaluation.she is on linzess 290 mcg capsule daily.       Past Medical History:  Diagnosis Date  . Abnormality of gait   . Adenomatous polyp of colon 2002   41mm  . Allergic rhinitis   . Anxiety   . Anxiety and depression   . Chronic back pain   . Dementia without behavioral disturbance ([place])   . Depression   . Diverticulosis of colon   . Dry eye syndrome   . Dysphagia   . Dysthymic disorder   . Fibromyalgia   . GERD (gastroesophageal reflux  disease)   . H/O hiatal hernia   . History of adverse drug reaction   . History of cerebrovascular disease 09/24/2014  . History of recurrent UTIs   . Hypertension, benign   . Irritable bowel syndrome   . Low back pain syndrome   . Memory loss   . Mitral valve prolapse   . Paroxysmal A-fib ([place])   . Peripheral neuropathy    "both feet and legs"  . Physical deconditioning   . Sjogren's syndrome ([place])   . Therapeutic opioid-induced  constipation ([facility])   . Thyroid nodule   . Urinary incontinence    Past Surgical History:  Procedure Laterality Date  . ABDOMINAL HYSTERECTOMY  1967  . APPENDECTOMY    . CARDIAC CATHETERIZATION  02/17/2003   normal L main, LAD free of disease, Cfx free of disease, RCA free of disease (Dr. Loni Muse. [place])  . CATARACT EXTRACTION, BILATERAL    . CHOLECYSTECTOMY  2000  . COLONOSCOPY W/ BIOPSIES     multiple  . DENTAL SURGERY     multiple tooth extractions  . ESOPHAGOGASTRODUODENOSCOPY (EGD) WITH ESOPHAGEAL DILATION N/A 08/23/2012   Procedure: ESOPHAGOGASTRODUODENOSCOPY (EGD) WITH ESOPHAGEAL DILATION;  Surgeon: Milus Banister, MD;  Location: [facility];  Service: Endoscopy;  Laterality: N/A;  . NASAL SEPTUM SURGERY  1980  . NM MYOCAR PERF WALL MOTION  2003   persantine - normal static and dynamic study w/apical thinning and presvered LV function, no ischemia  . SINUS EXPLORATION     ossifiying fibroma  . TEMPOROMANDIBULAR JOINT SURGERY  1986   Dr. Terence Lux  . TRANSTHORACIC ECHOCARDIOGRAM  2001   mild LVH, normal LV    Allergies  Allergen Reactions  . Banana Nausea And Vomiting  . Codeine Nausea Only    unless given with Phenergan  . Klonopin [Clonazepam] Other (See Comments)    Causes hallucination   . Meperidine Hcl Nausea Only    unless given with Phenergan  . Norflex [Orphenadrine Citrate] Nausea Only    Unless given with Phenergan  . Oxycodone-Acetaminophen Nausea Only    unless given with phenergan  . Propoxyphene Hcl Nausea Only    unless given with phenergan  . Zoloft [Sertraline Hcl] Other (See Comments)    Caused lethargy  . Doxycycline Other (See Comments)    Unknown reaction  . Naproxen Other (See Comments)    Unknown reaction  . Penicillins Other (See Comments)    Unknown reaction Has patient had a PCN reaction causing immediate rash, facial/tongue/throat swelling, SOB or lightheadedness with hypotension: Unknown Has patient had a PCN reaction causing severe  rash involving mucus membranes or skin necrosis: Unknown Has patient had a PCN reaction that required hospitalization: pt was in the hospital at time of reaction Has patient had a PCN reaction occurring within the last 10 years: Unknown If all of the above answers are "NO", then may proceed with Cephalos  . Phenothiazines Other (See Comments)    Unknown reaction  . Stelazine Other (See Comments)    Unknown reaction  . Sulfamethoxazole-Trimethoprim Other (See Comments)    Unknown reaction  . Tolectin [Tolmetin Sodium] Other (See Comments)    Unknown reaction  . Tramadol Other (See Comments)    Unknown reaction    Outpatient Encounter Medications as of 11/25/2018  Medication Sig  . acetaminophen (TYLENOL) 325 MG tablet Take 650 mg by mouth daily as needed for headache (pain).  Marland Kitchen ALPRAZolam (XANAX) 0.25 MG tablet Take 1 tablet (0.25 mg total) by mouth 2 (  two) times daily as needed.  Marland Kitchen antiseptic oral rinse (BIOTENE) LIQD 15 mLs by Mouth Rinse route 5 (five) times daily as needed for dry mouth.  Marland Kitchen aspirin 325 MG tablet Take 1 tablet (325 mg total) by mouth daily.  . Capsaicin-Menthol-Methyl Sal (CAPSAICIN-METHYL SAL-MENTHOL) 0.025-1-12 % CREA Apply 1 application topically 2 (two) times daily.  . carboxymethylcellulose (REFRESH TEARS) 0.5 % SOLN Place 1 drop into both eyes 5 (five) times daily as needed (dry eyes).   . CRANBERRY PO Take 2 tablets by mouth daily.  Marland Kitchen diltiazem (CARTIA XT) 180 MG 24 hr capsule Take 1 capsule (180 mg total) by mouth daily.  . DULoxetine (CYMBALTA) 30 MG capsule TAKE 1 CAPSULE BY MOUTH ONCE DAILY FOR BACK PAIN AND FOR DEPRESSION  . DULoxetine (CYMBALTA) 60 MG capsule Take 1 capsule by mouth once daily  . furosemide (LASIX) 40 MG tablet Take 1 tablet (40 mg total) by mouth daily.  . hydroxychloroquine (PLAQUENIL) 200 MG tablet Take 1 tablet by mouth once daily  . lansoprazole (PREVACID) 30 MG capsule TAKE 1 CAPSULE BY MOUTH ONCE DAILY AT  12  NOON  . LINZESS 290  MCG CAPS capsule TAKE 1 CAPSULE BY MOUTH EVERY DAY AT BEDTIME  . memantine (NAMENDA) 10 MG tablet Take 1 tablet by mouth twice daily  . MYRBETRIQ 50 MG TB24 tablet Take 1 tablet by mouth once daily  . ONE TOUCH LANCETS MISC Use to test blood sugar twice daily.  Dx: E11.9  . ONETOUCH VERIO test strip USE ONE STRIP TO CHECK GLUCOSE TWICE DAILY AS DIRECTED  . pilocarpine (SALAGEN) 5 MG tablet Take 1 tablet by mouth twice daily  . polyethylene glycol (MIRALAX / GLYCOLAX) packet Take 17 g by mouth daily as needed (constipation). Mix in 8 oz liquid and drink  . potassium chloride SA (K-DUR) 20 MEQ tablet Take 1 tablet by mouth once daily  . pregabalin (LYRICA) 100 MG capsule Take 1 capsule by mouth twice daily  . senna (SENOKOT) 8.6 MG TABS tablet Take 1 tablet (8.6 mg total) by mouth daily.  . sitaGLIPtin (JANUVIA) 100 MG tablet Take 1 tablet (100 mg total) by mouth daily.  . sodium chloride (OCEAN) 0.65 % SOLN nasal spray Place 1 spray into the nose as needed for congestion.   No facility-administered encounter medications on file as of 11/25/2018.     Review of Systems  Constitutional: Negative for chills, fatigue and fever.  HENT: Positive for congestion. Negative for rhinorrhea, sinus pressure, sinus pain, sneezing and sore throat.   Eyes: Positive for visual disturbance. Negative for pain, discharge, redness and itching.  Respiratory: Negative for cough, chest tightness, shortness of breath and wheezing.   Cardiovascular: Negative for chest pain, palpitations and leg swelling.  Gastrointestinal: Negative for abdominal distention, abdominal pain, nausea and vomiting.       IBS   Neurological: Positive for headaches. Negative for dizziness.       Chronic peripheral neuropathy   Psychiatric/Behavioral: Positive for confusion. Negative for sleep disturbance. The patient is not nervous/anxious.     Immunization History  Administered Date(s) Administered  . Fluad Quad(high Dose 65+)  11/08/2018  . Influenza Split 12/26/2010, 01/15/2012  . Influenza Whole 12/30/2008, 11/15/2009  . Influenza, High Dose Seasonal PF 12/21/2016, 11/05/2017  . Influenza,inj,Quad PF,6+ Mos 02/04/2013, 11/03/2013, 12/05/2014, 12/13/2015  . Pneumococcal Conjugate-13 06/12/2014  . Pneumococcal Polysaccharide-23 02/15/2009, 12/05/2014  . Tdap 12/08/2013  . Zoster 01/29/2012   Pertinent  Health Maintenance Due  Topic Date Due  .  FOOT EXAM  07/04/1939  . OPHTHALMOLOGY EXAM  07/04/1939  . HEMOGLOBIN A1C  09/05/2018  . MAMMOGRAM  09/12/2019  . INFLUENZA VACCINE  Completed  . DEXA SCAN  Completed  . PNA vac Low Risk Adult  Completed  . URINE MICROALBUMIN  Discontinued   Fall Risk  11/08/2018 09/11/2018 07/11/2018 05/27/2018 03/07/2018  Falls in the past year? 1 0 1 1 0  Number falls in past yr: 1 0 0 1 0  Injury with Fall? 0 0 0 1 0  Comment - - - - -  Risk Factor Category  - - - - -  Risk for fall due to : History of fall(s);Impaired balance/gait - - - -  Follow up - - - - -   There were no vitals filed for this visit. There is no height or weight on file to calculate BMI. Physical Exam  Unable to complete on Telephone visit.   Labs reviewed: Recent Labs    01/29/18 1408  NA 140  K 4.2  CL 102  CO2 28  GLUCOSE 84  BUN 27*  CREATININE 1.03*  CALCIUM 10.1   Recent Labs    01/29/18 1408  AST 21  ALT 13  BILITOT 0.3  PROT 7.3   Recent Labs    01/29/18 1408  WBC 7.6  NEUTROABS 4,438  HGB 12.6  HCT 38.8  MCV 92.8  PLT 225   Lab Results  Component Value Date   TSH 1.35 09/04/2017   Lab Results  Component Value Date   HGBA1C 6.0 (H) 03/07/2018   Lab Results  Component Value Date   CHOL 183 09/06/2017   HDL 63 09/06/2017   LDLCALC 99 09/06/2017   LDLDIRECT 65.7 07/08/2010   TRIG 113 09/06/2017   CHOLHDL 2.9 09/06/2017    Significant Diagnostic Results in last 30 days:  Ct Abdomen Pelvis W Contrast  Result Date: 10/29/2018 CLINICAL DATA:  Bilateral lower  quadrant abdominal pain EXAM: CT ABDOMEN AND PELVIS WITH CONTRAST TECHNIQUE: Multidetector CT imaging of the abdomen and pelvis was performed using the standard protocol following bolus administration of intravenous contrast. CONTRAST:  141mL ISOVUE-300 IOPAMIDOL (ISOVUE-300) INJECTION 61% COMPARISON:  CT abdomen pelvis 04/29/2017 FINDINGS: Lower chest: Bibasilar areas of scarring and atelectasis. Borderline cardiomegaly with right atrial enlargement. No pericardial effusion. Mild right diaphragmatic eventration. Hepatobiliary: No focal liver abnormality is seen. No gallstones, gallbladder wall thickening, or biliary dilatation. There is a new 2.6 cm hypoattenuating lesion in the tip of the liver, incompletely characterized on the single-phase CT exam. Patient is post cholecystectomy. Increased prominence biliary tree. Possibly related to a combination of reservoir effect and senescent change. No calcified intraductal gallstones. Pancreas: Diffuse pancreatic atrophy. No concerning pancreatic lesions or ductal dilatation. Spleen: Normal in size without focal abnormality. Adrenals/Urinary Tract: Normal adrenal glands. Stable simple appearing renal kidneys are otherwise unremarkable, without renal calculi, suspicious lesion, or hydronephrosis. Mild circumferential bladder wall thickening with small amount of intraluminal gas anti dependently. Stomach/Bowel: Small hiatal hernia. Stomach and duodenal sweep are otherwise unremarkable enteric contrast medium passes to the distal small bowel. No small bowel dilatation or wall thickening. No evidence of obstruction. The appendix is surgically absent. Scattered colonic diverticula without focal pericolonic inflammation to suggest diverticulitis. No colonic dilatation or wall thickening. Vascular/Lymphatic: Atherosclerotic plaque within the normal caliber aorta and branch vessels. No suspicious or enlarged lymph nodes in the included lymphatic chains. Reproductive: Uterus is  surgically absent. No concerning adnexal lesions. Other: No abdominopelvic free fluid or  free gas. No bowel containing hernias. Fat containing umbilical hernia. Musculoskeletal: Multilevel degenerative changes are present in the imaged portions of the spine. Features are maximal at L5-S1, similar to comparison. Vertebral body hemangioma at L3, unchanged from prior. No acute or suspicious osseous lesions. IMPRESSION: 1. Mild circumferential bladder wall thickening with small amount of intraluminal gas. Correlate for cystitis. 2. New 2.6 cm hypoattenuating lesion in the tip of the liver, incompletely characterized on the single-phase CT exam. Recommend further evaluation with nonemergent liver protocol MRI. 3. Colonic diverticulosis without acute diverticulitis. 4. Small hiatal hernia. 5. Small right diaphragmatic eventration. 6. Borderline cardiomegaly and right atrial enlargement. 7. Aortic Atherosclerosis (ICD10-I70.0). Electronically Signed   By: Lovena Le M.D.   On: 10/29/2018 21:03    Assessment/Plan 1. Acute nonintractable headache, unspecified headache type Waking up with headaches x 1 week.No Nausea,vomiting or dizziness.? Vision changes has up coming appointment with Ophthalmology. - topiramate (TOPAMAX) 25 MG tablet; Take 1 tablet (25 mg total) by mouth daily.  Dispense: 30 tablet; Refill: 0  2. Irritable bowel syndrome with both constipation and diarrhea Reports worsening symptoms.continue on Linzess 290 mcg capsule daily.Refer to GI per patient's request.   3. Peripheral polyneuropathy Continue on pregabalin 100 mg capsule twice daily.   Family/ staff Communication: Reviewed plan of care with patient and care giver.   Labs/tests ordered: None   Spent 29 minutes of non-face to face with patient    Sandrea Hughs, NP

## 2018-11-28 ENCOUNTER — Other Ambulatory Visit: Payer: Self-pay | Admitting: Internal Medicine

## 2018-11-28 DIAGNOSIS — G6289 Other specified polyneuropathies: Secondary | ICD-10-CM

## 2018-11-28 DIAGNOSIS — M545 Low back pain, unspecified: Secondary | ICD-10-CM

## 2018-11-28 NOTE — Telephone Encounter (Signed)
Verified medication in [place] [facility] database last refill was 11/01/2018 for a 30 day supply. Last appointment was 11/25/2018 and next appointment is scheduled for 11/10/2018. Routing to provider for approval.

## 2018-11-29 ENCOUNTER — Telehealth: Payer: Self-pay

## 2018-11-29 NOTE — Telephone Encounter (Signed)
RMA Peter Congo from [facility] called to request information on patient before her appointment I asked if she had access to Epic she said yes nothing futher

## 2018-12-04 ENCOUNTER — Encounter: Payer: Self-pay | Admitting: Nurse Practitioner

## 2018-12-04 ENCOUNTER — Ambulatory Visit (INDEPENDENT_AMBULATORY_CARE_PROVIDER_SITE_OTHER)
Admission: RE | Admit: 2018-12-04 | Discharge: 2018-12-04 | Disposition: A | Discharge status: Home / Self Care | Payer: Medicare Other | Source: Ambulatory Visit | Attending: Nurse Practitioner | Admitting: Nurse Practitioner

## 2018-12-04 ENCOUNTER — Ambulatory Visit (INDEPENDENT_AMBULATORY_CARE_PROVIDER_SITE_OTHER): Payer: Medicare Other | Admitting: Nurse Practitioner

## 2018-12-04 ENCOUNTER — Other Ambulatory Visit (INDEPENDENT_AMBULATORY_CARE_PROVIDER_SITE_OTHER): Payer: Medicare Other

## 2018-12-04 ENCOUNTER — Other Ambulatory Visit: Payer: Self-pay

## 2018-12-04 VITALS — BP 120/70 | HR 69 | Temp 98.4°F | Ht 61.0 in | Wt 151.0 lb

## 2018-12-04 DIAGNOSIS — K625 Hemorrhage of anus and rectum: Secondary | ICD-10-CM | POA: Diagnosis not present

## 2018-12-04 DIAGNOSIS — R140 Abdominal distension (gaseous): Secondary | ICD-10-CM

## 2018-12-04 DIAGNOSIS — K5900 Constipation, unspecified: Secondary | ICD-10-CM

## 2018-12-04 DIAGNOSIS — R109 Unspecified abdominal pain: Secondary | ICD-10-CM | POA: Diagnosis not present

## 2018-12-04 LAB — COMPREHENSIVE METABOLIC PANEL
ALT: 16 U/L (ref 0–35)
AST: 17 U/L (ref 0–37)
Albumin: 4.4 g/dL (ref 3.5–5.2)
Alkaline Phosphatase: 77 U/L (ref 39–117)
BUN: 23 mg/dL (ref 6–23)
CO2: 30 mEq/L (ref 19–32)
Calcium: 10.1 mg/dL (ref 8.4–10.5)
Chloride: 100 mEq/L (ref 96–112)
Creatinine, Ser: 0.86 mg/dL (ref 0.40–1.20)
GFR: 62.36 mL/min (ref >60.00)
Glucose, Bld: 107 mg/dL — ABNORMAL HIGH (ref 70–99)
Potassium: 3.9 mEq/L (ref 3.5–5.1)
Sodium: 137 mEq/L (ref 135–145)
Total Bilirubin: 0.4 mg/dL (ref 0.2–1.2)
Total Protein: 7.6 g/dL (ref 6.0–8.3)

## 2018-12-04 LAB — CBC
HCT: 39.2 % (ref 36.0–46.0)
Hemoglobin: 13.3 g/dL (ref 12.0–15.0)
MCHC: 33.9 g/dL (ref 30.0–36.0)
MCV: 93.2 fl (ref 78.0–100.0)
Platelets: 226 10*3/uL (ref 150.0–400.0)
RBC: 4.2 Mil/uL (ref 3.87–5.11)
RDW: 12.9 % (ref 11.5–15.5)
WBC: 4.8 10*3/uL (ref 4.0–10.5)

## 2018-12-04 MED ORDER — HYDROCORTISONE (PERIANAL) 2.5 % EX CREA: 1.0000 "application " | TOPICAL_CREAM | Freq: Every day | CUTANEOUS | 0 refills | Status: DC

## 2018-12-04 NOTE — Patient Instructions (Addendum)
If you are age 83 or older, your body mass index should be between 23-30. Your Body mass index is 28.53 kg/m. If this is out of the aforementioned range listed, please consider follow up with your Primary Care Provider.  If you are age 32 or younger, your body mass index should be between 19-25. Your Body mass index is 28.53 kg/m. If this is out of the aformentioned range listed, please consider follow up with your Primary Care Provider.   Your provider has requested that you go to the basement level for lab work AND x-ray before leaving today. Press "B" on the elevator. The lab is located at the first door on the left as you exit the elevator.  We have sent the following medications to your pharmacy for you to pick up at your convenience: Anusol cream  DECREASE Miralax to 1/2 capful as needed.  Follow up with Dr. Carlean Purl on 12/26/18 at 3:10 pm.  Thank you for choosing me and [facility].   Tye Savoy, NP

## 2018-12-05 NOTE — Progress Notes (Signed)
Chief Complaint:  Constipation / rectal bleeding     IMPRESSION and PLAN:    1. 85 yo female with chronic constipation and chronic hemorrhoidal bleeding.  -Continue daily Linzess and senna. Uses Miralax as needed which is about 3 x / week but it causes loose stools. Suggested to caregiver that she give only 1/2 capful of miralax at a time.  -Anusol cream as directed. Caregiver will help administer during the day as I don't think patient can manage applying it herself at bedtime -Return visit in ~ 3 weeks  2. Lower abdominal distention, probably gas / maybe stool in left colon.No bowel findings of CT AP in September.  - KUB  3.  Physical deconditioning. Also, mentation seems a bit slow though this is my first time meeting her, Caregiver pointed this out as well. Complains of legs starting to "give away" which is apparently a new issue. Stays by herself at night, she is at risk for falls. Caregiver to talk with daughter about getting appointment with PCP for further evaluation. I will send copy of my note to [name] -Given these changes in mental / physical status I will obtain some basic labs  3. New hypoattenuting liver lesion measuring 2.6 cm on CT scan.  -Consider MRI but at this point I don't think she is physically able to undergo workup  HPI:     Patient is an 85 yo female with pmh significant for dementia, fibromyalgia, HTN, CVA, AFIB, Sjogren's,  IBS / constipation, and chronic intermittent hemorrhoidal bleeding. Here with caregiver for evaluation of constipation and recurrent hemorrhoidal bleeding.  For constipation she tried Amitiza in the past. Patient nor caregiver unsure why Amitiza was stopped. I was sble to get daughter on telephone and she thinks cost was the issue.  Currently taking L inzess and senna but still asks caregiver for Miralax several times a week for complaints of small hard balls of stool. Miralax causes patient to then have loose stools. She still has  rectal bleeding with BMs from time to time. Caregiver says patient has frequently complained of abdominal bloating for several months.  Caregiver concerned about recent mental status changes in patient. She is slower to respond to questions than normal. Patient's caregiver stays with her during the day but she is alone at night and caregiver concerned.    CT AP w/ contrast 10/29/18 for lower abdominal pain  IMPRESSION: 1. Mild circumferential bladder wall thickening with small amount of intraluminal gas. Correlate for cystitis. 2. New 2.6 cm hypoattenuating lesion in the tip of the liver, incompletely characterized on the single-phase CT exam. Recommend further evaluation with nonemergent liver protocol MRI. 3. Colonic diverticulosis without acute diverticulitis. 4. Small hiatal hernia. 5. Small right diaphragmatic eventration. 6. Borderline cardiomegaly and right atrial enlargement. 7. Aortic Atherosclerosis (ICD10-I70.0).  Review of systems:     No chest pain, no SOB, no fevers, no urinary sx   Past Medical History:  Diagnosis Date  . Abnormality of gait   . Adenomatous polyp of colon 2002   94mm  . Allergic rhinitis   . Anxiety   . Anxiety and depression   . Chronic back pain   . Dementia without behavioral disturbance ([place])   . Depression   . Diverticulosis of colon   . Dry eye syndrome   . Dysphagia   . Dysthymic disorder   . Fibromyalgia   . GERD (gastroesophageal reflux disease)   . H/O hiatal hernia   .  History of adverse drug reaction   . History of cerebrovascular disease 09/24/2014  . History of recurrent UTIs   . Hypertension, benign   . Irritable bowel syndrome   . Low back pain syndrome   . Memory loss   . Mitral valve prolapse   . Paroxysmal A-fib ([place])   . Peripheral neuropathy    "both feet and legs"  . Physical deconditioning   . Sjogren's syndrome ([place])   . Therapeutic opioid-induced constipation (OIC)   . Thyroid nodule   . Urinary incontinence      Patient's surgical history, family medical history, social history, medications and allergies were all reviewed in Epic   Serum creatinine: 0.86 mg/dL 12/04/18 1245 Estimated creatinine clearance: 40.8 mL/min  Current Outpatient Medications  Medication Sig Dispense Refill  . acetaminophen (TYLENOL) 325 MG tablet Take 650 mg by mouth daily as needed for headache (pain).    Marland Kitchen ALPRAZolam (XANAX) 0.25 MG tablet Take 1 tablet (0.25 mg total) by mouth 2 (two) times daily as needed. 60 tablet 0  . antiseptic oral rinse (BIOTENE) LIQD 15 mLs by Mouth Rinse route 5 (five) times daily as needed for dry mouth.    Marland Kitchen aspirin 325 MG tablet Take 1 tablet (325 mg total) by mouth daily. 30 tablet 2  . Capsaicin-Menthol-Methyl Sal (CAPSAICIN-METHYL SAL-MENTHOL) 0.025-1-12 % CREA Apply 1 application topically 2 (two) times daily. 56.6 g 0  . carboxymethylcellulose (REFRESH TEARS) 0.5 % SOLN Place 1 drop into both eyes 5 (five) times daily as needed (dry eyes).     . CRANBERRY PO Take 2 tablets by mouth daily.    Marland Kitchen diltiazem (CARTIA XT) 180 MG 24 hr capsule Take 1 capsule (180 mg total) by mouth daily. 90 capsule 3  . DULoxetine (CYMBALTA) 30 MG capsule TAKE 1 CAPSULE BY MOUTH ONCE DAILY FOR BACK PAIN AND FOR DEPRESSION 90 capsule 0  . DULoxetine (CYMBALTA) 60 MG capsule Take 1 capsule by mouth once daily 90 capsule 0  . furosemide (LASIX) 40 MG tablet Take 1 tablet (40 mg total) by mouth daily. 90 tablet 1  . hydroxychloroquine (PLAQUENIL) 200 MG tablet Take 1 tablet by mouth once daily 30 tablet 0  . lansoprazole (PREVACID) 30 MG capsule TAKE 1 CAPSULE BY MOUTH ONCE DAILY AT  12  NOON 90 capsule 0  . LINZESS 290 MCG CAPS capsule TAKE 1 CAPSULE BY MOUTH EVERY DAY AT BEDTIME 30 capsule 0  . memantine (NAMENDA) 10 MG tablet Take 1 tablet by mouth twice daily 180 tablet 0  . MYRBETRIQ 50 MG TB24 tablet Take 1 tablet by mouth once daily 90 tablet 0  . ONE TOUCH LANCETS MISC Use to test blood sugar twice daily.   Dx: E11.9 100 each 11  . ONETOUCH VERIO test strip USE ONE STRIP TO CHECK GLUCOSE TWICE DAILY AS DIRECTED 100 each 0  . pilocarpine (SALAGEN) 5 MG tablet Take 1 tablet by mouth twice daily 180 tablet 0  . polyethylene glycol (MIRALAX / GLYCOLAX) packet Take 17 g by mouth daily as needed (constipation). Mix in 8 oz liquid and drink    . potassium chloride SA (K-DUR) 20 MEQ tablet Take 1 tablet by mouth once daily 90 tablet 3  . pregabalin (LYRICA) 100 MG capsule Take 1 capsule by mouth twice daily 60 capsule 0  . senna (SENOKOT) 8.6 MG TABS tablet Take 1 tablet (8.6 mg total) by mouth daily. 120 each 0  . sitaGLIPtin (JANUVIA) 100 MG tablet Take 1 tablet (  100 mg total) by mouth daily. 90 tablet 1  . sodium chloride (OCEAN) 0.65 % SOLN nasal spray Place 1 spray into the nose as needed for congestion. 30 mL 0  . topiramate (TOPAMAX) 25 MG tablet Take 1 tablet (25 mg total) by mouth daily. 30 tablet 0  . hydrocortisone (ANUSOL-HC) 2.5 % rectal cream Place 1 application rectally daily. Use for 10 days 30 g 0   No current facility-administered medications for this visit.     Physical Exam:     BP 120/70   Pulse 69   Temp 98.4 F (36.9 C)   Ht 5\' 1"  (1.549 m)   Wt 151 lb (68.5 kg)   BMI 28.53 kg/m   GENERAL:  Pleasant elderly female in NAD PSYCH: : Cooperative, normal affect EENT:  conjunctiva pink, mucous membranes moist, neck supple without masses CARDIAC:  RRR,  no peripheral edema PULM: Normal respiratory effort, lungs CTA bilaterally, no wheezing ABDOMEN:  Soft, nontender,  Lower abdomen moderately distended with tympany except in LLQ quadrant ( more flat to percussion). Nondistended, soft, nontender. No obvious masses, no hepatomegaly,  normal bowel sounds Rectal: Lax sphincter, inflamed external hemorrhoids SKIN:  turgor, no lesions seen Musculoskeletal:  Normal muscle tone, normal strength NEURO: Alert and oriented x 3, no focal neurologic deficits   Tye Savoy , NP  12/05/2018, 2:01 PM   Cc: Hollace Kinnier, DO

## 2018-12-06 ENCOUNTER — Other Ambulatory Visit: Payer: Self-pay | Admitting: Internal Medicine

## 2018-12-06 ENCOUNTER — Ambulatory Visit (INDEPENDENT_AMBULATORY_CARE_PROVIDER_SITE_OTHER): Payer: Medicare Other | Admitting: Family

## 2018-12-06 ENCOUNTER — Other Ambulatory Visit: Payer: Self-pay

## 2018-12-06 ENCOUNTER — Encounter: Payer: Self-pay | Admitting: Nurse Practitioner

## 2018-12-06 ENCOUNTER — Encounter: Payer: Self-pay | Admitting: Family

## 2018-12-06 VITALS — BP 126/57 | HR 68

## 2018-12-06 DIAGNOSIS — R399 Unspecified symptoms and signs involving the genitourinary system: Secondary | ICD-10-CM | POA: Diagnosis not present

## 2018-12-06 DIAGNOSIS — R4182 Altered mental status, unspecified: Secondary | ICD-10-CM

## 2018-12-06 DIAGNOSIS — K2100 Gastro-esophageal reflux disease with esophagitis, without bleeding: Secondary | ICD-10-CM

## 2018-12-06 NOTE — Progress Notes (Signed)
This service is provided via telemedicine  No vital signs collected/recorded due to the encounter was a telemedicine visit.   Location of patient (ex: home, work):  Home   Patient consents to a telephone visit:  Yes  Location of the provider (ex: office, home):  Office   Name of any referring provider:  [place] of all persons participating in the telemedicine service and their role in the encounter:  [name], NP, Ruthell Rummage CMA, and Danford Bad and daughterJanet, and Amy caregiver   Time spent on call: Ruthell Rummage CMA spent 20 minutes on phone with patient.     [facility]  Provider: [name], NP   Code Status: FULL Goals of Care:  Advanced Directives 11/25/2018  Does Patient Have a Medical Advance Directive? (No Data)  Type of Advance Directive -  Does patient want to make changes to medical advance directive? -  Copy of [place] in Chart? -  Would patient like information on creating a medical advance directive? No - Patient declined  Pre-existing out of facility DNR order (yellow form or pink MOST form) -     No chief complaint on file.   HPI: Patient is a 85 y.o. female seen today for an acute visit for symptoms of urinary tract infection x 3 days.she states having issues with burning sensation with urination,urine frequency and urgency.Also states has been feeling tired all the time and has headache.she denies any fever or chills.describes appetite as good.Patient's care giver and daughter states patient has been disoriented and confused since Wednesday.Patient's caregiver states patient has been sleeping more than usual.she was unable to answer question on her recent GI visit.  Patient daughters thinks patient may have uti. States has had similar symptoms in the past and was admitted to the Hospital. She states follows up with Urology due to frequent UTI's.No note for view.  She complains of on going pain on her leg  due to Neuropathy. Labs done by GI provider CBC and CMP was unremarkable.    Past Medical History:  Diagnosis Date  . Abnormality of gait   . Adenomatous polyp of colon 2002   84mm  . Allergic rhinitis   . Anxiety   . Anxiety and depression   . Chronic back pain   . Dementia without behavioral disturbance ([place])   . Depression   . Diverticulosis of colon   . Dry eye syndrome   . Dysphagia   . Dysthymic disorder   . Fibromyalgia   . GERD (gastroesophageal reflux disease)   . H/O hiatal hernia   . History of adverse drug reaction   . History of cerebrovascular disease 09/24/2014  . History of recurrent UTIs   . Hypertension, benign   . Irritable bowel syndrome   . Low back pain syndrome   . Memory loss   . Mitral valve prolapse   . Paroxysmal A-fib ([place])   . Peripheral neuropathy    "both feet and legs"  . Physical deconditioning   . Sjogren's syndrome ([place])   . Therapeutic opioid-induced constipation (OIC)   . Thyroid nodule   . Urinary incontinence     Past Surgical History:  Procedure Laterality Date  . ABDOMINAL HYSTERECTOMY  1967  . APPENDECTOMY    . CARDIAC CATHETERIZATION  02/17/2003   normal L main, LAD free of disease, Cfx free of disease, RCA free of disease (Dr. Loni Muse. [facility])  . CATARACT EXTRACTION, BILATERAL    .  CHOLECYSTECTOMY  2000  . COLONOSCOPY W/ BIOPSIES     multiple  . DENTAL SURGERY     multiple tooth extractions  . ESOPHAGOGASTRODUODENOSCOPY (EGD) WITH ESOPHAGEAL DILATION N/A 08/23/2012   Procedure: ESOPHAGOGASTRODUODENOSCOPY (EGD) WITH ESOPHAGEAL DILATION;  Surgeon: Milus Banister, MD;  Location: [facility];  Service: Endoscopy;  Laterality: N/A;  . NASAL SEPTUM SURGERY  1980  . NM MYOCAR PERF WALL MOTION  2003   persantine - normal static and dynamic study w/apical thinning and presvered LV function, no ischemia  . SINUS EXPLORATION     ossifiying fibroma  . TEMPOROMANDIBULAR JOINT SURGERY  1986   Dr. Terence Lux  . TRANSTHORACIC  ECHOCARDIOGRAM  2001   mild LVH, normal LV    Allergies  Allergen Reactions  . Banana Nausea And Vomiting  . Codeine Nausea Only    unless given with Phenergan  . Klonopin [Clonazepam] Other (See Comments)    Causes hallucination   . Meperidine Hcl Nausea Only    unless given with Phenergan  . Norflex [Orphenadrine Citrate] Nausea Only    Unless given with Phenergan  . Oxycodone-Acetaminophen Nausea Only    unless given with phenergan  . Propoxyphene Hcl Nausea Only    unless given with phenergan  . Zoloft [Sertraline Hcl] Other (See Comments)    Caused lethargy  . Doxycycline Other (See Comments)    Unknown reaction  . Naproxen Other (See Comments)    Unknown reaction  . Penicillins Other (See Comments)    Unknown reaction Has patient had a PCN reaction causing immediate rash, facial/tongue/throat swelling, SOB or lightheadedness with hypotension: Unknown Has patient had a PCN reaction causing severe rash involving mucus membranes or skin necrosis: Unknown Has patient had a PCN reaction that required hospitalization: pt was in the hospital at time of reaction Has patient had a PCN reaction occurring within the last 10 years: Unknown If all of the above answers are "NO", then may proceed with Cephalos  . Phenothiazines Other (See Comments)    Unknown reaction  . Stelazine Other (See Comments)    Unknown reaction  . Sulfamethoxazole-Trimethoprim Other (See Comments)    Unknown reaction  . Tolectin [Tolmetin Sodium] Other (See Comments)    Unknown reaction  . Tramadol Other (See Comments)    Unknown reaction    Outpatient Encounter Medications as of 12/06/2018  Medication Sig  . acetaminophen (TYLENOL) 325 MG tablet Take 650 mg by mouth daily as needed for headache (pain).  Marland Kitchen ALPRAZolam (XANAX) 0.25 MG tablet Take 1 tablet (0.25 mg total) by mouth 2 (two) times daily as needed.  Marland Kitchen antiseptic oral rinse (BIOTENE) LIQD 15 mLs by Mouth Rinse route 5 (five) times daily as  needed for dry mouth.  Marland Kitchen aspirin 325 MG tablet Take 1 tablet (325 mg total) by mouth daily.  . Capsaicin-Menthol-Methyl Sal (CAPSAICIN-METHYL SAL-MENTHOL) 0.025-1-12 % CREA Apply 1 application topically 2 (two) times daily.  . carboxymethylcellulose (REFRESH TEARS) 0.5 % SOLN Place 1 drop into both eyes 5 (five) times daily as needed (dry eyes).   . CRANBERRY PO Take 2 tablets by mouth daily.  Marland Kitchen diltiazem (CARTIA XT) 180 MG 24 hr capsule Take 1 capsule (180 mg total) by mouth daily.  . DULoxetine (CYMBALTA) 30 MG capsule TAKE 1 CAPSULE BY MOUTH ONCE DAILY FOR BACK PAIN AND FOR DEPRESSION  . DULoxetine (CYMBALTA) 60 MG capsule Take 1 capsule by mouth once daily  . furosemide (LASIX) 40 MG tablet Take 1 tablet (40 mg total) by  mouth daily.  . hydrocortisone (ANUSOL-HC) 2.5 % rectal cream Place 1 application rectally daily. Use for 10 days  . hydroxychloroquine (PLAQUENIL) 200 MG tablet Take 1 tablet by mouth once daily  . lansoprazole (PREVACID) 30 MG capsule TAKE 1 CAPSULE BY MOUTH ONCE DAILY AT NOON  . LINZESS 290 MCG CAPS capsule TAKE 1 CAPSULE BY MOUTH EVERY DAY AT BEDTIME  . memantine (NAMENDA) 10 MG tablet Take 1 tablet by mouth twice daily  . MYRBETRIQ 50 MG TB24 tablet Take 1 tablet by mouth once daily  . ONE TOUCH LANCETS MISC Use to test blood sugar twice daily.  Dx: E11.9  . ONETOUCH VERIO test strip USE ONE STRIP TO CHECK GLUCOSE TWICE DAILY AS DIRECTED  . pilocarpine (SALAGEN) 5 MG tablet Take 1 tablet by mouth twice daily  . polyethylene glycol (MIRALAX / GLYCOLAX) packet Take 17 g by mouth daily as needed (constipation). Mix in 8 oz liquid and drink  . potassium chloride SA (K-DUR) 20 MEQ tablet Take 1 tablet by mouth once daily  . pregabalin (LYRICA) 100 MG capsule Take 1 capsule by mouth twice daily  . senna (SENOKOT) 8.6 MG TABS tablet Take 1 tablet (8.6 mg total) by mouth daily.  . sitaGLIPtin (JANUVIA) 100 MG tablet Take 1 tablet (100 mg total) by mouth daily.  . sodium  chloride (OCEAN) 0.65 % SOLN nasal spray Place 1 spray into the nose as needed for congestion.  . topiramate (TOPAMAX) 25 MG tablet Take 1 tablet (25 mg total) by mouth daily.   No facility-administered encounter medications on file as of 12/06/2018.     Review of Systems:  Review of Systems  Constitutional: Positive for fatigue. Negative for appetite change, chills and fever.  HENT: Negative for congestion, rhinorrhea, sinus pressure, sinus pain, sneezing and sore throat.   Respiratory: Negative for cough, chest tightness, shortness of breath and wheezing.   Cardiovascular: Negative for chest pain, palpitations and leg swelling.  Gastrointestinal: Negative for abdominal distention, constipation, diarrhea, nausea and vomiting.  Genitourinary: Positive for frequency and urgency. Negative for difficulty urinating.       Burning with urination   Musculoskeletal: Positive for arthralgias, back pain and gait problem.       Chronic Back pain   Skin: Negative for color change, pallor and rash.  Neurological: Positive for headaches. Negative for light-headedness.  Psychiatric/Behavioral: Positive for confusion. Negative for agitation and sleep disturbance.    Health Maintenance  Topic Date Due  . FOOT EXAM  07/04/1939  . OPHTHALMOLOGY EXAM  07/04/1939  . HEMOGLOBIN A1C  09/05/2018  . MAMMOGRAM  09/12/2019  . TETANUS/TDAP  12/09/2023  . INFLUENZA VACCINE  Completed  . DEXA SCAN  Completed  . PNA vac Low Risk Adult  Completed  . URINE MICROALBUMIN  Discontinued    Physical Exam: There were no vitals filed for this visit. There is no height or weight on file to calculate BMI. Physical Exam  Unable to complete on telephone visit   Labs reviewed: Basic Metabolic Panel: Recent Labs    01/29/18 1408 12/04/18 1245  NA 140 137  K 4.2 3.9  CL 102 100  CO2 28 30  GLUCOSE 84 107*  BUN 27* 23  CREATININE 1.03* 0.86  CALCIUM 10.1 10.1   Liver Function Tests: Recent Labs     01/29/18 1408 12/04/18 1245  AST 21 17  ALT 13 16  ALKPHOS  --  77  BILITOT 0.3 0.4  PROT 7.3 7.6  ALBUMIN  --  4.4   No results for input(s): LIPASE, AMYLASE in the last 8760 hours. No results for input(s): AMMONIA in the last 8760 hours. CBC: Recent Labs    01/29/18 1408 12/04/18 1245  WBC 7.6 4.8  NEUTROABS 4,438  --   HGB 12.6 13.3  HCT 38.8 39.2  MCV 92.8 93.2  PLT 225 226.0   Lipid Panel: No results for input(s): CHOL, HDL, LDLCALC, TRIG, CHOLHDL, LDLDIRECT in the last 8760 hours. Lab Results  Component Value Date   HGBA1C 6.0 (H) 03/07/2018    Procedures since last visit: Dg Abd 1 View  Result Date: 12/04/2018 CLINICAL DATA:  Abdominal pain, constipation EXAM: ABDOMEN - 1 VIEW COMPARISON:  CT 10/29/2018.  Plain film 10/10/2012 FINDINGS: Prior cholecystectomy. Nonobstructive bowel gas pattern. Moderate stool burden throughout the colon. No organomegaly, suspicious calcification or free air. IMPRESSION: No acute findings.  Moderate stool burden. Electronically Signed   By: Rolm Baptise M.D.   On: 12/04/2018 16:38    Assessment/Plan 1. Altered mental status, unspecified altered mental status type Afebrile.symptoms have worsen since 12/04/2018.Recommended evaluation in the ED.   2. Symptoms of urinary tract infection Afebrile.discussed with patient to provider urine specimen for U/A and C/S but care giver and daughter states unable to bring patient to the office today.Recommended further evaluation in the ED for Altered mental status.Daughter would like to wait and see how patient does if symptoms worsen she will take her to ED if not will provide urine specimen of 12/09/2018.she states will also consider appointment with her Urologist.    Labs/tests ordered:None  Spent 16 minutes of non-face to face with patient

## 2018-12-13 ENCOUNTER — Encounter (HOSPITAL_BASED_OUTPATIENT_CLINIC_OR_DEPARTMENT_OTHER): Payer: Self-pay

## 2018-12-13 ENCOUNTER — Emergency Department (HOSPITAL_BASED_OUTPATIENT_CLINIC_OR_DEPARTMENT_OTHER)
Admission: EM | Admit: 2018-12-13 | Discharge: 2018-12-13 | Disposition: A | Discharge status: Home / Self Care | Payer: Medicare Other | Attending: Emergency Medicine | Admitting: Emergency Medicine

## 2018-12-13 ENCOUNTER — Emergency Department (HOSPITAL_BASED_OUTPATIENT_CLINIC_OR_DEPARTMENT_OTHER): Payer: Medicare Other

## 2018-12-13 ENCOUNTER — Other Ambulatory Visit: Payer: Self-pay

## 2018-12-13 DIAGNOSIS — Y9389 Activity, other specified: Secondary | ICD-10-CM | POA: Insufficient documentation

## 2018-12-13 DIAGNOSIS — F0390 Unspecified dementia without behavioral disturbance: Secondary | ICD-10-CM | POA: Diagnosis not present

## 2018-12-13 DIAGNOSIS — Z79899 Other long term (current) drug therapy: Secondary | ICD-10-CM | POA: Diagnosis not present

## 2018-12-13 DIAGNOSIS — Z87891 Personal history of nicotine dependence: Secondary | ICD-10-CM | POA: Diagnosis not present

## 2018-12-13 DIAGNOSIS — S300XXA Contusion of lower back and pelvis, initial encounter: Secondary | ICD-10-CM

## 2018-12-13 DIAGNOSIS — Y998 Other external cause status: Secondary | ICD-10-CM | POA: Diagnosis not present

## 2018-12-13 DIAGNOSIS — E1140 Type 2 diabetes mellitus with diabetic neuropathy, unspecified: Secondary | ICD-10-CM | POA: Insufficient documentation

## 2018-12-13 DIAGNOSIS — S0083XA Contusion of other part of head, initial encounter: Secondary | ICD-10-CM | POA: Diagnosis not present

## 2018-12-13 DIAGNOSIS — R102 Pelvic and perineal pain: Secondary | ICD-10-CM | POA: Diagnosis not present

## 2018-12-13 DIAGNOSIS — R2681 Unsteadiness on feet: Secondary | ICD-10-CM | POA: Diagnosis not present

## 2018-12-13 DIAGNOSIS — Z8673 Personal history of transient ischemic attack (TIA), and cerebral infarction without residual deficits: Secondary | ICD-10-CM | POA: Insufficient documentation

## 2018-12-13 DIAGNOSIS — Z7982 Long term (current) use of aspirin: Secondary | ICD-10-CM | POA: Diagnosis not present

## 2018-12-13 DIAGNOSIS — Y92019 Unspecified place in single-family (private) house as the place of occurrence of the external cause: Secondary | ICD-10-CM | POA: Insufficient documentation

## 2018-12-13 DIAGNOSIS — S3993XA Unspecified injury of pelvis, initial encounter: Secondary | ICD-10-CM | POA: Diagnosis not present

## 2018-12-13 DIAGNOSIS — S0990XA Unspecified injury of head, initial encounter: Secondary | ICD-10-CM | POA: Diagnosis not present

## 2018-12-13 DIAGNOSIS — W19XXXA Unspecified fall, initial encounter: Secondary | ICD-10-CM | POA: Diagnosis not present

## 2018-12-13 DIAGNOSIS — M25512 Pain in left shoulder: Secondary | ICD-10-CM | POA: Diagnosis not present

## 2018-12-13 DIAGNOSIS — S299XXA Unspecified injury of thorax, initial encounter: Secondary | ICD-10-CM | POA: Diagnosis not present

## 2018-12-13 DIAGNOSIS — S40012A Contusion of left shoulder, initial encounter: Secondary | ICD-10-CM | POA: Diagnosis not present

## 2018-12-13 DIAGNOSIS — R220 Localized swelling, mass and lump, head: Secondary | ICD-10-CM | POA: Diagnosis not present

## 2018-12-13 DIAGNOSIS — S0993XA Unspecified injury of face, initial encounter: Secondary | ICD-10-CM | POA: Diagnosis not present

## 2018-12-13 LAB — BASIC METABOLIC PANEL
Anion gap: 8 (ref 5–15)
BUN: 27 mg/dL — ABNORMAL HIGH (ref 8–23)
CO2: 27 mmol/L (ref 22–32)
Calcium: 9.6 mg/dL (ref 8.9–10.3)
Chloride: 105 mmol/L (ref 98–111)
Creatinine, Ser: 0.72 mg/dL (ref 0.44–1.00)
GFR calc Af Amer: >60 mL/min (ref >60)
GFR calc non Af Amer: >60 mL/min (ref >60)
Glucose, Bld: 120 mg/dL — ABNORMAL HIGH (ref 70–99)
Potassium: 3.8 mmol/L (ref 3.5–5.1)
Sodium: 140 mmol/L (ref 135–145)

## 2018-12-13 LAB — CBC WITH DIFFERENTIAL/PLATELET
Abs Immature Granulocytes: 0.03 10*3/uL (ref 0.00–0.07)
Basophils Absolute: 0 10*3/uL (ref 0.0–0.1)
Basophils Relative: 1 %
Eosinophils Absolute: 0.2 10*3/uL (ref 0.0–0.5)
Eosinophils Relative: 3 %
HCT: 40.9 % (ref 36.0–46.0)
Hemoglobin: 13.4 g/dL (ref 12.0–15.0)
Immature Granulocytes: 0 %
Lymphocytes Relative: 20 %
Lymphs Abs: 1.6 10*3/uL (ref 0.7–4.0)
MCH: 31.5 pg (ref 26.0–34.0)
MCHC: 32.8 g/dL (ref 30.0–36.0)
MCV: 96.2 fL (ref 80.0–100.0)
Monocytes Absolute: 0.7 10*3/uL (ref 0.1–1.0)
Monocytes Relative: 9 %
Neutro Abs: 5.4 10*3/uL (ref 1.7–7.7)
Neutrophils Relative %: 67 %
Platelets: 211 10*3/uL (ref 150–400)
RBC: 4.25 MIL/uL (ref 3.87–5.11)
RDW: 13.1 % (ref 11.5–15.5)
WBC: 8 10*3/uL (ref 4.0–10.5)
nRBC: 0 % (ref 0.0–0.2)

## 2018-12-13 LAB — URINALYSIS, ROUTINE W REFLEX MICROSCOPIC
Bilirubin Urine: NEGATIVE
Glucose, UA: NEGATIVE mg/dL
Hgb urine dipstick: NEGATIVE
Ketones, ur: NEGATIVE mg/dL
Nitrite: POSITIVE — AB
Protein, ur: NEGATIVE mg/dL
Specific Gravity, Urine: <1.005 — ABNORMAL LOW (ref 1.005–1.030)
pH: 6 (ref 5.0–8.0)

## 2018-12-13 LAB — URINALYSIS, MICROSCOPIC (REFLEX): RBC / HPF: NONE SEEN RBC/hpf (ref 0–5)

## 2018-12-13 LAB — CK: Total CK: 59 U/L (ref 38–234)

## 2018-12-13 MED ORDER — CEPHALEXIN 500 MG PO CAPS: 500.0000 mg | ORAL_CAPSULE | Freq: Three times a day (TID) | ORAL | 0 refills | Status: DC

## 2018-12-13 MED ORDER — CEPHALEXIN 250 MG PO CAPS
250.0000 mg | ORAL_CAPSULE | Freq: Once | ORAL | Status: AC
  Administered 2018-12-13: 250 mg via ORAL
  Filled 2018-12-13: qty 1

## 2018-12-13 NOTE — TOC Initial Note (Signed)
Transition of Care [facility]) - Initial/Assessment Note    Patient Details  Name: Ariana RANDOL MRN: [id number] Date of Birth: Jan 10, 1930  Transition of Care [facility]) CM/SW Contact:    Erenest Rasher, RN Phone Number: [phone] 12/13/2018, 4:47 PM  Clinical Narrative:                 Spoke to pt's dtr, Ariana [name]. Offered choice for [facility], pt had [facility]. She has RW, Rollator and cane. Contacted [place], Ronalee Belts with new referral. She has a private duty caregiver that comes from 9-5 pm that they pay out of pocket.    Expected Discharge Plan: [place] Barriers to Discharge: No Barriers Identified   Patient Goals and CMS Choice Patient states their goals for this hospitalization and ongoing recovery are:: long term for ALF, (Memory Care) CMS Medicare.gov Compare Post Acute Care list provided to:: Patient Represenative (must comment) Choice offered to / list presented to : Adult Children  Expected Discharge Plan and Services Expected Discharge Plan: [place]   Discharge Planning Services: CM Consult Post Acute Care Choice: [place] arrangements for the past 2 months: Single Family Home                           HH Arranged: RN, PT, OT, Social Work [employer] Agency: [facility] [employer] [facility] [employer] Date [place]: 12/13/18 Time [place]: [place]    Prior Living Arrangements/Services Living arrangements for the past 2 months: [place] with:: Self Patient language and need for interpreter reviewed:: Yes Do you feel safe going back to the place where you live?: Yes      Need for Family Participation in Patient Care: Yes (Comment) Care giver support system in place?: Yes (comment) Current home services: DME, Homehealth aide(rolling walker, rollator, cane) Criminal Activity/Legal Involvement Pertinent to Current Situation/Hospitalization: No - Comment as needed  Activities of  Daily Living      Permission Sought/Granted Permission sought to share information with : Case Manager, PCP, Family Supports Permission granted to share information with : Yes, Verbal Permission Granted  Share Information with NAME: Ariana [name]  Permission granted to share info w AGENCY: [place] granted to share info w Relationship: daughter  Permission granted to share info w Contact Information: [phone]  Emotional Assessment       Orientation: : Oriented to Self Alcohol / Substance Use: Tobacco Use Psych Involvement: No (comment)  Admission diagnosis:  FALL/  LEFT EYE INJURY, SHOULDER AND BUTTOCK PAIN Patient Active Problem List   Diagnosis Date Noted  . Diabetes mellitus due to underlying condition, uncontrolled ([place]) 09/04/2017  . History of rectal bleeding 09/04/2017  . A-fib ([place]) 04/23/2017  . Diabetes mellitus type 2 in nonobese ([place]) 04/23/2017  . Sjogren's disease ([place]) 04/03/2016  . Primary osteoarthritis of both hands 04/03/2016  . High risk medication use 04/03/2016  . Senile dementia, with behavioral disturbance ([place]) 08/16/2015  . Swelling 08/16/2015  . Diastolic dysfunction 0000000  . Hypertonicity, bladder 07/08/2015  . Arterial hypotension   . Bradycardia 06/29/2015  . Chronic lower back pain 06/29/2015  . PAF (paroxysmal atrial fibrillation) ([place]) 04/18/2015  . Dehydration 04/18/2015  . Dementia without behavioral disturbance ([place]) 04/18/2015  . TIA (transient ischemic attack) 12/17/2014  . Chest pain 12/16/2014  . Acute encephalopathy 12/04/2014  . Fall   . AKI (  acute kidney injury) ([place]) 12/03/2014  . History of cerebrovascular disease 09/24/2014  . Falls frequently 07/28/2014  . Infarction of parietal lobe ([place])   . Mild dementia ([place])   . CVA (cerebral infarction) 07/26/2014  . Atrial fibrillation with RVR ([place]) 07/03/2014  . Constipation 04/15/2014  . Mixed stress and urge urinary incontinence 11/03/2013  .  Therapeutic opioid induced constipation 11/03/2013  . Hemorrhoid 11/03/2013  . Low back pain associated with a spinal disorder other than radiculopathy or spinal stenosis 11/03/2013  . Protein-calorie malnutrition, severe ([place]) 07/22/2013  . UTI (lower urinary tract infection) 07/20/2013  . Prolonged QT interval 07/20/2013  . Osteopenia 07/17/2013  . Palpitations 06/30/2013  . Hereditary and idiopathic peripheral neuropathy 05/22/2013  . Abnormality of gait 12/05/2012  . Headache(784.0) 09/05/2012  . Multinodular thyroid 01/15/2012  . Neck pain 01/15/2012  . Hypercholesterolemia 07/08/2010  . Tear film insufficiency 07/06/2009  . [place] SYNDROME 07/06/2009  . Urge urinary incontinence 01/06/2009  . COLONIC POLYPS, ADENOMATOUS, HX OF 04/15/2008  . MITRAL VALVE PROLAPSE 11/05/2007  . ANXIETY DEPRESSION 07/02/2007  . Mononeuritis 07/02/2007  . HYPERTENSION, BENIGN 07/02/2007  . GERD 07/02/2007  . Irritable bowel syndrome 07/02/2007  . Fibromyalgia 07/02/2007   PCP:  Gayland Curry, DO Pharmacy:   [facility], [place] [address] Phone: [phone] Fax: [phone]     Social Determinants of Health (SDOH) Interventions    Readmission Risk Interventions No flowsheet data found.

## 2018-12-13 NOTE — ED Triage Notes (Signed)
Pt was found by caregiver who is with her M-F 9-5. Caregiver reports this is the patients 3rd fall this week reporting that all have been at night. Patient states that she remembers going to the bathroom and "feeling funny" states that when she got up she thinks she fell. Pt has large bruised area to left eye and reports being sore on left side. Caregiver reports giving 1000 mg of Tylenol PTA at 9 am.

## 2018-12-13 NOTE — Discharge Instructions (Addendum)
You were evaluated in the emergency department for injuries from a fall.  You had a CAT scan of your head face along with x-rays of your chest shoulder and pelvis that did not show any acute serious findings.  Your urinalysis showed a possible urine infection and it was sent for culture.  We are sending you home with a prescription for Keflex to take until finished.  We are also consulting with case management to see if we can get you some services at home.  Please contact your primary care doctor for close follow-up and return to the emergency department if any worsening symptoms.

## 2018-12-13 NOTE — ED Notes (Signed)
Pt attempted to void on bedpan, unable to produce urine. This RN bladder scanned which showed 137 mL.

## 2018-12-13 NOTE — ED Notes (Signed)
Patient transported to CT 

## 2018-12-13 NOTE — ED Provider Notes (Signed)
[facility] EMERGENCY DEPARTMENT Provider Note   CSN: [id number] Arrival date & time: 12/13/18  1013     History   Chief Complaint Chief Complaint  Patient presents with  . Fall    HPI Ariana [name] is a 85 y.o. female.  She lives alone and has a caregiver with her Monday through Friday 9 AM to 5 PM.  She was found by her caregiver this morning after a probable fall.  The patient herself is not sure why she fell.  She is complaining of some pain in her legs.  Caregiver reports that she has neuropathy.  She also states that this is the third fall this week.  Level 5 caveat secondary to dementia.  Patient denies any illness symptoms.     The history is provided by the patient and a caregiver.  Fall This is a recurrent problem. Episode onset: unknown. The problem occurs every several days. The problem has not changed since onset.Pertinent negatives include no chest pain, no abdominal pain, no headaches and no shortness of breath. Nothing aggravates the symptoms. Nothing relieves the symptoms. She has tried acetaminophen for the symptoms. The treatment provided mild relief.    Past Medical History:  Diagnosis Date  . Abnormality of gait   . Adenomatous polyp of colon 2002   73mm  . Allergic rhinitis   . Anxiety   . Anxiety and depression   . Chronic back pain   . Dementia without behavioral disturbance ([place])   . Depression   . Diverticulosis of colon   . Dry eye syndrome   . Dysphagia   . Dysthymic disorder   . Fibromyalgia   . GERD (gastroesophageal reflux disease)   . H/O hiatal hernia   . History of adverse drug reaction   . History of cerebrovascular disease 09/24/2014  . History of recurrent UTIs   . Hypertension, benign   . Irritable bowel syndrome   . Low back pain syndrome   . Memory loss   . Mitral valve prolapse   . Paroxysmal A-fib ([place])   . Peripheral neuropathy    "both feet and legs"  . Physical deconditioning   . Sjogren's syndrome ([place])    . Therapeutic opioid-induced constipation ([place])   . Thyroid nodule   . Urinary incontinence     Patient Active Problem List   Diagnosis Date Noted  . Diabetes mellitus due to underlying condition, uncontrolled ([place]) 09/04/2017  . History of rectal bleeding 09/04/2017  . A-fib ([place]) 04/23/2017  . Diabetes mellitus type 2 in nonobese ([place]) 04/23/2017  . Sjogren's disease ([place]) 04/03/2016  . Primary osteoarthritis of both hands 04/03/2016  . High risk medication use 04/03/2016  . Senile dementia, with behavioral disturbance ([place]) 08/16/2015  . Swelling 08/16/2015  . Diastolic dysfunction 0000000  . Hypertonicity, bladder 07/08/2015  . Arterial hypotension   . Bradycardia 06/29/2015  . Chronic lower back pain 06/29/2015  . PAF (paroxysmal atrial fibrillation) ([place]) 04/18/2015  . Dehydration 04/18/2015  . Dementia without behavioral disturbance ([place]) 04/18/2015  . TIA (transient ischemic attack) 12/17/2014  . Chest pain 12/16/2014  . Acute encephalopathy 12/04/2014  . Fall   . AKI (acute kidney injury) ([place]) 12/03/2014  . History of cerebrovascular disease 09/24/2014  . Falls frequently 07/28/2014  . Infarction of parietal lobe ([place])   . Mild dementia ([place])   . CVA (cerebral infarction) 07/26/2014  . Atrial fibrillation with RVR ([place]) 07/03/2014  . Constipation 04/15/2014  . Mixed stress and urge  urinary incontinence 11/03/2013  . Therapeutic opioid induced constipation 11/03/2013  . Hemorrhoid 11/03/2013  . Low back pain associated with a spinal disorder other than radiculopathy or spinal stenosis 11/03/2013  . Protein-calorie malnutrition, severe ([place]) 07/22/2013  . UTI (lower urinary tract infection) 07/20/2013  . Prolonged QT interval 07/20/2013  . Osteopenia 07/17/2013  . Palpitations 06/30/2013  . Hereditary and idiopathic peripheral neuropathy 05/22/2013  . Abnormality of gait 12/05/2012  . Headache(784.0) 09/05/2012  . Multinodular thyroid 01/15/2012  . Neck  pain 01/15/2012  . Hypercholesterolemia 07/08/2010  . Tear film insufficiency 07/06/2009  . [place] SYNDROME 07/06/2009  . Urge urinary incontinence 01/06/2009  . COLONIC POLYPS, ADENOMATOUS, HX OF 04/15/2008  . MITRAL VALVE PROLAPSE 11/05/2007  . ANXIETY DEPRESSION 07/02/2007  . Mononeuritis 07/02/2007  . HYPERTENSION, BENIGN 07/02/2007  . GERD 07/02/2007  . Irritable bowel syndrome 07/02/2007  . Fibromyalgia 07/02/2007    Past Surgical History:  Procedure Laterality Date  . ABDOMINAL HYSTERECTOMY  1967  . APPENDECTOMY    . CARDIAC CATHETERIZATION  02/17/2003   normal L main, LAD free of disease, Cfx free of disease, RCA free of disease (Dr. Loni Muse. [place])  . CATARACT EXTRACTION, BILATERAL    . CHOLECYSTECTOMY  2000  . COLONOSCOPY W/ BIOPSIES     multiple  . DENTAL SURGERY     multiple tooth extractions  . ESOPHAGOGASTRODUODENOSCOPY (EGD) WITH ESOPHAGEAL DILATION N/A 08/23/2012   Procedure: ESOPHAGOGASTRODUODENOSCOPY (EGD) WITH ESOPHAGEAL DILATION;  Surgeon: Milus Banister, MD;  Location: [facility];  Service: Endoscopy;  Laterality: N/A;  . NASAL SEPTUM SURGERY  1980  . NM MYOCAR PERF WALL MOTION  2003   persantine - normal static and dynamic study w/apical thinning and presvered LV function, no ischemia  . SINUS EXPLORATION     ossifiying fibroma  . TEMPOROMANDIBULAR JOINT SURGERY  1986   Dr. Terence Lux  . TRANSTHORACIC ECHOCARDIOGRAM  2001   mild LVH, normal LV     OB History   No obstetric history on file.      Home Medications    Prior to Admission medications   Medication Sig Start Date End Date Taking? Authorizing Provider  acetaminophen (TYLENOL) 500 MG tablet Take 1,000 mg by mouth 2 (two) times daily.    [provider]  ALPRAZolam Duanne Moron) 0.25 MG tablet Take 1 tablet (0.25 mg total) by mouth 2 (two) times daily as needed. 11/19/18   Reed, Tiffany L, DO  antiseptic oral rinse (BIOTENE) LIQD 15 mLs by Mouth Rinse route 5 (five) times daily as needed  for dry mouth.    [provider]  aspirin 325 MG tablet Take 1 tablet (325 mg total) by mouth daily. 07/28/14   Reyne Dumas, MD  Capsaicin-Menthol-Methyl Sal (CAPSAICIN-METHYL SAL-MENTHOL) 0.025-1-12 % CREA Apply 1 application topically 2 (two) times daily. 09/11/18   Ngetich, Dinah C, NP  carboxymethylcellulose (REFRESH TEARS) 0.5 % SOLN Place 1 drop into both eyes 5 (five) times daily as needed (dry eyes).     [provider]  CRANBERRY PO Take 2 tablets by mouth daily.    [provider]  diltiazem (CARTIA XT) 180 MG 24 hr capsule Take 1 capsule (180 mg total) by mouth daily. 03/20/18   Hilty, Nadean Corwin, MD  DULoxetine (CYMBALTA) 30 MG capsule TAKE 1 CAPSULE BY MOUTH ONCE DAILY FOR BACK PAIN AND FOR DEPRESSION 11/01/18   Mariea Clonts, Tiffany L, DO  DULoxetine (CYMBALTA) 60 MG capsule Take 1 capsule by mouth once daily 11/01/18   Hollace Kinnier  L, DO  furosemide (LASIX) 40 MG tablet Take 1 tablet (40 mg total) by mouth daily. 11/08/18   Ngetich, Dinah C, NP  hydrocortisone (ANUSOL-HC) 2.5 % rectal cream Place 1 application rectally daily. Use for 10 days 12/04/18   Willia Craze, NP  hydroxychloroquine (PLAQUENIL) 200 MG tablet Take 1 tablet by mouth once daily Patient not taking: Reported on 12/06/2018 08/30/18   Bo Merino, MD  lansoprazole (PREVACID) 30 MG capsule TAKE 1 CAPSULE BY MOUTH ONCE DAILY AT NOON 12/06/18   Reed, Tiffany L, DO  LINZESS 290 MCG CAPS capsule TAKE 1 CAPSULE BY MOUTH EVERY DAY AT BEDTIME 12/06/18   Gatha Mayer, MD  memantine [facility]) 10 MG tablet Take 1 tablet by mouth twice daily 11/19/18   Mariea Clonts, Tiffany L, DO  MYRBETRIQ 50 MG TB24 tablet Take 1 tablet by mouth once daily 10/14/18   Reed, Tiffany L, DO  ONE TOUCH LANCETS MISC Use to test blood sugar twice daily.  Dx: E11.9 05/15/17   Reed, Tiffany L, DO  ONETOUCH VERIO test strip USE ONE STRIP TO CHECK GLUCOSE TWICE DAILY AS DIRECTED 06/03/18   Reed, Tiffany L, DO  pilocarpine (SALAGEN) 5 MG  tablet Take 1 tablet by mouth twice daily 10/31/18   Bo Merino, MD  polyethylene glycol (MIRALAX / GLYCOLAX) packet Take by mouth daily as needed (constipation). Mix 1/2 capful of miralax in 8 oz liquid and drink    [provider]  potassium chloride SA (K-DUR) 20 MEQ tablet Take 1 tablet by mouth once daily 08/01/18   Mariea Clonts, Tiffany L, DO  pregabalin (LYRICA) 100 MG capsule Take 1 capsule by mouth twice daily 11/28/18   Reed, Tiffany L, DO  sitaGLIPtin (JANUVIA) 100 MG tablet Take 1 tablet (100 mg total) by mouth daily. 11/08/18   Ngetich, Dinah C, NP  sodium chloride (OCEAN) 0.65 % SOLN nasal spray Place 1 spray into the nose as needed for congestion. 08/23/12   Hongalgi, Lenis Dickinson, MD  topiramate (TOPAMAX) 25 MG tablet Take 1 tablet (25 mg total) by mouth daily. 11/25/18   Ngetich, Nelda Bucks, NP    Family History Family History  Problem Relation Age of Onset  . Heart disease Father        heart attack  . Pneumonia Father   . Heart attack Mother   . Hypertension Mother   . Colon cancer Sister   . Kidney disease Daughter   . Asthma Daughter   . Arthritis Daughter 41       osteo,  . Heart disease Son 71       stage 3 CHF(Diastolic /Systolic)  . Throat cancer Brother   . Hypertension Maternal Grandmother     Social History Social History   Tobacco Use  . Smoking status: Former Research scientist (life sciences)  . Smokeless tobacco: Never Used  . Tobacco comment: Quit at age 24   Substance Use Topics  . Alcohol use: No  . Drug use: Never     Allergies   Banana, Codeine, Klonopin [clonazepam], Meperidine hcl, Norflex [orphenadrine citrate], Oxycodone-acetaminophen, Propoxyphene hcl, Zoloft [sertraline hcl], Doxycycline, Naproxen, Penicillins, Phenothiazines, Stelazine, Sulfamethoxazole-trimethoprim, Tolectin [tolmetin sodium], and Tramadol   Review of Systems Review of Systems  Unable to perform ROS: Dementia  Respiratory: Negative for shortness of breath.   Cardiovascular: Negative for  chest pain.  Gastrointestinal: Negative for abdominal pain.  Neurological: Negative for headaches.     Physical Exam Updated Vital Signs BP 117/61 (BP Location: Right Arm)   Pulse  62   Temp 98.7 F (37.1 C) (Oral)   Resp 18   Ht 5\' 3"  (1.6 m)   Wt 68.5 kg   SpO2 97%   BMI 26.75 kg/m   Physical Exam Vitals signs and nursing note reviewed.  Constitutional:      General: She is not in acute distress.    Appearance: She is well-developed.  HENT:     Head: Normocephalic.     Comments: She has some dried blood and a large area of bruising around her left eye socket.  Her eye itself appears uninjured and anterior chamber clear.  Pupils are round and reactive.  No crepitus. Eyes:     Conjunctiva/sclera: Conjunctivae normal.     Pupils: Pupils are equal, round, and reactive to light.  Neck:     Musculoskeletal: Normal range of motion and neck supple. No muscular tenderness.  Cardiovascular:     Rate and Rhythm: Normal rate and regular rhythm.     Heart sounds: No murmur.  Pulmonary:     Effort: Pulmonary effort is normal. No respiratory distress.     Breath sounds: Normal breath sounds.  Abdominal:     Palpations: Abdomen is soft.     Tenderness: There is no abdominal tenderness.  Musculoskeletal: Normal range of motion.        General: No deformity.     Comments: She is approximately 3 cm area of ecchymosis at the top of her left buttock over her sacrum.  Also has a large area of bruising and tenderness anterior left shoulder.  Other extremities full range of motion without any particular tenderness.  Skin:    General: Skin is warm and dry.     Capillary Refill: Capillary refill takes less than 2 seconds.  Neurological:     General: No focal deficit present.     Mental Status: She is alert. Mental status is at baseline.     Gait: Gait abnormal (She is rather slow and unsteady with ambulation.).      ED Treatments / Results  Labs (all labs ordered are listed, but only  abnormal results are displayed) Labs Reviewed  BASIC METABOLIC PANEL - Abnormal; Notable for the following components:      Result Value   Glucose, Bld 120 (*)    BUN 27 (*)    All other components within normal limits  URINALYSIS, ROUTINE W REFLEX MICROSCOPIC - Abnormal; Notable for the following components:   APPearance HAZY (*)    Specific Gravity, Urine <1.005 (*)    Nitrite POSITIVE (*)    Leukocytes,Ua SMALL (*)    All other components within normal limits  URINALYSIS, MICROSCOPIC (REFLEX) - Abnormal; Notable for the following components:   Bacteria, UA MANY (*)    All other components within normal limits  URINE CULTURE  CBC WITH DIFFERENTIAL/PLATELET  CK    EKG None  Radiology No results found.  Procedures Procedures (including critical care time)  Medications Ordered in ED Medications  cephALEXin (KEFLEX) capsule 250 mg (250 mg Oral Given 12/13/18 1345)     Initial Impression / Assessment and Plan / ED Course  I have reviewed the triage vital signs and the nursing notes.  Pertinent labs & imaging results that were available during my care of the patient were reviewed by me and considered in my medical decision making (see chart for details).  Clinical Course as of Dec 12 1399  Fri Dec 13, 2018  1046 Elderly female with frequent falls here  for evaluation after another fall.  Concern for possible intracranial bleed along with facial fractures.  Shoulder dislocation or fracture.  Getting CAT scan plain films labs urinalysis.   [MB]  [id number] I viewed the patient's CAT scans of her head and face and my impression is that she has signs of a stroke possibly old but no obvious bleed.  I do not see any obvious facial fractures.  Awaiting radiology readings.   [MB]  [id number] Patient's readings of her images are not crossing into epic.  I was able to review them and the head was read as old infarcts no acute findings.  She also has signs of some prior right maxillary sinus  surgery but no acute fractures.  Chest x-ray left shoulder x-ray and pelvis x-ray were negative for signs of fracture or pneumothorax.   [MB]  [id number] Lab work so far unremarkable.  Waiting on urinalysis.  I placed a consult in to get case management involved with may be getting some home services as I do not think she is probably doing very well alone.    [MB]    Clinical Course User Index [MB] Hayden Rasmussen, MD        Final Clinical Impressions(s) / ED Diagnoses   Final diagnoses:  Fall, initial encounter  Contusion of face, initial encounter  Contusion of left shoulder, initial encounter  Contusion of sacrum, initial encounter  Unsteady gait    ED Discharge Orders         Ordered    cephALEXin (KEFLEX) 500 MG capsule  3 times daily     12/13/18 1355           Hayden Rasmussen, MD 12/13/18 1402

## 2018-12-15 LAB — URINE CULTURE: Culture: >=100000 — AB

## 2018-12-16 ENCOUNTER — Other Ambulatory Visit: Payer: Self-pay

## 2018-12-16 ENCOUNTER — Encounter: Payer: Self-pay | Admitting: Family

## 2018-12-16 ENCOUNTER — Ambulatory Visit (INDEPENDENT_AMBULATORY_CARE_PROVIDER_SITE_OTHER): Payer: Medicare Other | Admitting: Family

## 2018-12-16 ENCOUNTER — Telehealth: Payer: Self-pay | Admitting: Emergency Medicine

## 2018-12-16 VITALS — BP 124/74 | HR 71 | Temp 97.5°F | Ht 63.0 in | Wt 151.2 lb

## 2018-12-16 DIAGNOSIS — N3000 Acute cystitis without hematuria: Secondary | ICD-10-CM | POA: Diagnosis not present

## 2018-12-16 DIAGNOSIS — R2681 Unsteadiness on feet: Secondary | ICD-10-CM

## 2018-12-16 DIAGNOSIS — S0083XD Contusion of other part of head, subsequent encounter: Secondary | ICD-10-CM

## 2018-12-16 DIAGNOSIS — W19XXXD Unspecified fall, subsequent encounter: Secondary | ICD-10-CM

## 2018-12-16 NOTE — Telephone Encounter (Signed)
Post ED Visit - Positive Culture Follow-up  Culture report reviewed by antimicrobial stewardship pharmacist: [place] Team []  Elenor Quinones, Pharm.D. [x]  Heide Guile, Pharm.D., BCPS AQ-ID []  Parks Neptune, Pharm.D., BCPS []  Alycia Rossetti, Pharm.D., BCPS []  [place], [place].D., BCPS, AAHIVP []  Legrand Como, Pharm.D., BCPS, AAHIVP []  Salome Arnt, PharmD, BCPS []  Johnnette Gourd, PharmD, BCPS []  Hughes Better, PharmD, BCPS []  Leeroy Cha, PharmD []  Laqueta Linden, PharmD, BCPS []  Albertina Parr, PharmD  [place] Team []  Leodis Sias, PharmD []  Lindell Spar, PharmD []  Royetta Asal, PharmD []  Graylin Shiver, Rph []  Rema Fendt) Glennon Mac, PharmD []  Arlyn Dunning, PharmD []  Netta Cedars, PharmD []  Dia Sitter, PharmD []  Leone Haven, PharmD []  Gretta Arab, PharmD []  Theodis Shove, PharmD []  Peggyann Juba, PharmD []  Reuel Boom, PharmD   Positive urine culture Treated with cephalexin, organism sensitive to the same and no further patient follow-up is required at this time.  Hazle Nordmann 12/16/2018, 1:14 PM

## 2018-12-16 NOTE — Progress Notes (Signed)
Provider: Dinah Ngetich FNP-C  Gayland Curry, DO  Patient Care Team: Gayland Curry, DO as PCP - General (Geriatric Medicine) Noralee Space, MD as Consulting Physician (Pulmonary Disease) Maisie Fus, MD as Consulting Physician (Obstetrics and Gynecology) Debara Pickett Nadean Corwin, MD as Consulting Physician (Cardiology) Monna Fam, MD as Consulting Physician (Ophthalmology) Kathrynn Ducking, MD as Consulting Physician (Neurology) Franchot Gallo, MD as Consulting Physician (Urology) Lavonna Monarch, MD as Consulting Physician (Dermatology) Gatha Mayer, MD as Consulting Physician (Gastroenterology)  Extended Emergency Contact Information Primary Emergency Contact: Era Skeen Address: [address]          [place], [place] [address] Johnnette Litter of [place] Phone: [phone] Mobile Phone: [phone] Relation: Daughter Secondary Emergency Contact: Arvin Collard [place] of [place] Mobile Phone: [phone] Relation: Other  Code Status: Full Code  Goals of care: Advanced Directive information Advanced Directives 12/13/2018  Does Patient Have a Medical Advance Directive? No  Type of Advance Directive -  Does patient want to make changes to medical advance directive? -  Copy of [place] in Chart? -  Would patient like information on creating a medical advance directive? No - Patient declined  Pre-existing out of facility DNR order (yellow form or pink MOST form) -     Chief Complaint  Patient presents with  . Follow-up    Patient fell between 5 pm thursday evening and friday patient daughter states that was 3rd fall in a week  Patient bruised in left side of face, left arm and shoulder , bruised on bottom. O'Brien told patient to do follow up with provider.     HPI:  Pt is a 85 y.o. female seen today for an acute visit for follow up Ed visit for fall.she is here with her care giver.Patient states  fell between 5 pm thursday evening and Friday.she crawled to her recliner.Care giver states this was her 3rd fall in a week.  She sustained bruises on her left side of the face, left arm,shoulder and small   bruise on her bottom.She was taken to the ED via EMS.CXR and Pelvis X-ray were negative for fracture.CT scan of the head showed old infarcts no acute findings.Also showed right maxillary sinus surgery but no fractures.Her lab work  was unremarkable.Whitney told patient to do follow up with provider.Her urine analysis had hazy appearance,many bacteria and positive for nitrites.Urine culture results showed > 100,000 colonies of E.Coli she was discharged home on Keflex 500 mg tablet three times daily.   Past Medical History:  Diagnosis Date  . Abnormality of gait   . Adenomatous polyp of colon 2002   44mm  . Allergic rhinitis   . Anxiety   . Anxiety and depression   . Chronic back pain   . Dementia without behavioral disturbance ([place])   . Depression   . Diverticulosis of colon   . Dry eye syndrome   . Dysphagia   . Dysthymic disorder   . Fibromyalgia   . GERD (gastroesophageal reflux disease)   . H/O hiatal hernia   . History of adverse drug reaction   . History of cerebrovascular disease 09/24/2014  . History of recurrent UTIs   . Hypertension, benign   . Irritable bowel syndrome   . Low back pain syndrome   . Memory loss   . Mitral valve prolapse   . Paroxysmal A-fib ([place])   . Peripheral neuropathy    "both feet and  legs"  . Physical deconditioning   . Sjogren's syndrome ([place])   . Therapeutic opioid-induced constipation (OIC)   . Thyroid nodule   . Urinary incontinence    Past Surgical History:  Procedure Laterality Date  . ABDOMINAL HYSTERECTOMY  1967  . APPENDECTOMY    . CARDIAC CATHETERIZATION  02/17/2003   normal L main, LAD free of disease, Cfx free of disease, RCA free of disease (Dr. Loni Muse. [facility])  . CATARACT EXTRACTION, BILATERAL    . CHOLECYSTECTOMY   2000  . COLONOSCOPY W/ BIOPSIES     multiple  . DENTAL SURGERY     multiple tooth extractions  . ESOPHAGOGASTRODUODENOSCOPY (EGD) WITH ESOPHAGEAL DILATION N/A 08/23/2012   Procedure: ESOPHAGOGASTRODUODENOSCOPY (EGD) WITH ESOPHAGEAL DILATION;  Surgeon: Milus Banister, MD;  Location: [facility];  Service: Endoscopy;  Laterality: N/A;  . NASAL SEPTUM SURGERY  1980  . NM MYOCAR PERF WALL MOTION  2003   persantine - normal static and dynamic study w/apical thinning and presvered LV function, no ischemia  . SINUS EXPLORATION     ossifiying fibroma  . TEMPOROMANDIBULAR JOINT SURGERY  1986   Dr. Terence Lux  . TRANSTHORACIC ECHOCARDIOGRAM  2001   mild LVH, normal LV    Allergies  Allergen Reactions  . Banana Nausea And Vomiting  . Codeine Nausea Only    unless given with Phenergan  . Klonopin [Clonazepam] Other (See Comments)    Causes hallucination   . Meperidine Hcl Nausea Only    unless given with Phenergan  . Norflex [Orphenadrine Citrate] Nausea Only    Unless given with Phenergan  . Oxycodone-Acetaminophen Nausea Only    unless given with phenergan  . Propoxyphene Hcl Nausea Only    unless given with phenergan  . Zoloft [Sertraline Hcl] Other (See Comments)    Caused lethargy  . Doxycycline Other (See Comments)    Unknown reaction  . Naproxen Other (See Comments)    Unknown reaction  . Penicillins Other (See Comments)    Unknown reaction Has patient had a PCN reaction causing immediate rash, facial/tongue/throat swelling, SOB or lightheadedness with hypotension: Unknown Has patient had a PCN reaction causing severe rash involving mucus membranes or skin necrosis: Unknown Has patient had a PCN reaction that required hospitalization: pt was in the hospital at time of reaction Has patient had a PCN reaction occurring within the last 10 years: Unknown If all of the above answers are "NO", then may proceed with Cephalos  . Phenothiazines Other (See Comments)    Unknown  reaction  . Stelazine Other (See Comments)    Unknown reaction  . Sulfamethoxazole-Trimethoprim Other (See Comments)    Unknown reaction  . Tolectin [Tolmetin Sodium] Other (See Comments)    Unknown reaction  . Tramadol Other (See Comments)    Unknown reaction    Outpatient Encounter Medications as of 12/16/2018  Medication Sig  . acetaminophen (TYLENOL) 500 MG tablet Take 1,000 mg by mouth 2 (two) times daily.  Marland Kitchen ALPRAZolam (XANAX) 0.25 MG tablet Take 1 tablet (0.25 mg total) by mouth 2 (two) times daily as needed.  Marland Kitchen antiseptic oral rinse (BIOTENE) LIQD 15 mLs by Mouth Rinse route 5 (five) times daily as needed for dry mouth.  Marland Kitchen aspirin 325 MG tablet Take 1 tablet (325 mg total) by mouth daily.  . Capsaicin-Menthol-Methyl Sal (CAPSAICIN-METHYL SAL-MENTHOL) 0.025-1-12 % CREA Apply 1 application topically 2 (two) times daily.  . carboxymethylcellulose (REFRESH TEARS) 0.5 % SOLN Place 1 drop into both eyes 5 (five) times daily  as needed (dry eyes).   . cephALEXin (KEFLEX) 500 MG capsule Take 1 capsule (500 mg total) by mouth 3 (three) times daily.  Marland Kitchen CRANBERRY PO Take 2 tablets by mouth daily.  Marland Kitchen diltiazem (CARTIA XT) 180 MG 24 hr capsule Take 1 capsule (180 mg total) by mouth daily.  . DULoxetine (CYMBALTA) 30 MG capsule TAKE 1 CAPSULE BY MOUTH ONCE DAILY FOR BACK PAIN AND FOR DEPRESSION  . DULoxetine (CYMBALTA) 60 MG capsule Take 1 capsule by mouth once daily  . furosemide (LASIX) 40 MG tablet Take 1 tablet (40 mg total) by mouth daily.  . hydrocortisone (ANUSOL-HC) 2.5 % rectal cream Place 1 application rectally daily. Use for 10 days  . lansoprazole (PREVACID) 30 MG capsule TAKE 1 CAPSULE BY MOUTH ONCE DAILY AT NOON  . LINZESS 290 MCG CAPS capsule TAKE 1 CAPSULE BY MOUTH EVERY DAY AT BEDTIME  . memantine (NAMENDA) 10 MG tablet Take 1 tablet by mouth twice daily  . MYRBETRIQ 50 MG TB24 tablet Take 1 tablet by mouth once daily  . ONE TOUCH LANCETS MISC Use to test blood sugar twice  daily.  Dx: E11.9  . ONETOUCH VERIO test strip USE ONE STRIP TO CHECK GLUCOSE TWICE DAILY AS DIRECTED  . pilocarpine (SALAGEN) 5 MG tablet Take 1 tablet by mouth twice daily  . polyethylene glycol (MIRALAX / GLYCOLAX) packet Take by mouth daily as needed (constipation). Mix 1/2 capful of miralax in 8 oz liquid and drink  . potassium chloride SA (K-DUR) 20 MEQ tablet Take 1 tablet by mouth once daily  . pregabalin (LYRICA) 100 MG capsule Take 1 capsule by mouth twice daily  . sitaGLIPtin (JANUVIA) 100 MG tablet Take 1 tablet (100 mg total) by mouth daily.  . sodium chloride (OCEAN) 0.65 % SOLN nasal spray Place 1 spray into the nose as needed for congestion.  . topiramate (TOPAMAX) 25 MG tablet Take 1 tablet (25 mg total) by mouth daily.  . [DISCONTINUED] hydroxychloroquine (PLAQUENIL) 200 MG tablet Take 1 tablet by mouth once daily (Patient not taking: Reported on 12/06/2018)   No facility-administered encounter medications on file as of 12/16/2018.     Review of Systems  Unable to perform ROS: Dementia (additional information provided by care giver )  Constitutional: Negative for appetite change, chills, fatigue and fever.  HENT: Negative for congestion, rhinorrhea, sinus pressure, sinus pain, sneezing and sore throat.   Eyes: Positive for redness. Negative for discharge, itching and visual disturbance.  Respiratory: Negative for cough, chest tightness, shortness of breath and wheezing.   Cardiovascular: Negative for chest pain, palpitations and leg swelling.  Gastrointestinal: Negative for abdominal distention, abdominal pain, constipation, diarrhea, nausea and vomiting.  Genitourinary: Negative for difficulty urinating, dysuria, flank pain, frequency and urgency.  Musculoskeletal: Positive for arthralgias and gait problem.  Skin: Negative for pallor and rash.       Bruises to left face,left shoulder and left lower back   Neurological: Negative for dizziness, weakness, light-headedness and  headaches.  Psychiatric/Behavioral: Negative for agitation and sleep disturbance. The patient is not nervous/anxious.     Immunization History  Administered Date(s) Administered  . Fluad Quad(high Dose 65+) 11/08/2018  . Influenza Split 12/26/2010, 01/15/2012  . Influenza Whole 12/30/2008, 11/15/2009  . Influenza, High Dose Seasonal PF 12/21/2016, 11/05/2017  . Influenza,inj,Quad PF,6+ Mos 02/04/2013, 11/03/2013, 12/05/2014, 12/13/2015  . Pneumococcal Conjugate-13 06/12/2014  . Pneumococcal Polysaccharide-23 02/15/2009, 12/05/2014  . Tdap 12/08/2013  . Zoster 01/29/2012   Pertinent  Health Maintenance Due  Topic Date Due  . FOOT EXAM  07/04/1939  . OPHTHALMOLOGY EXAM  07/04/1939  . HEMOGLOBIN A1C  09/05/2018  . MAMMOGRAM  09/12/2019  . INFLUENZA VACCINE  Completed  . DEXA SCAN  Completed  . PNA vac Low Risk Adult  Completed  . URINE MICROALBUMIN  Discontinued   Fall Risk  12/16/2018 12/06/2018 11/08/2018 09/11/2018 07/11/2018  Falls in the past year? 1 0 1 0 1  Number falls in past yr: 1 1 1  0 0  Injury with Fall? 1 0 0 0 0  Comment - - - - -  Risk Factor Category  - - - - -  Risk for fall due to : - - History of fall(s);Impaired balance/gait - -  Follow up - - - - -    Vitals:   12/16/18 1523  BP: 124/74  Pulse: 71  Temp: (!) 97.5 F (36.4 C)  TempSrc: Temporal  SpO2: 96%  Weight: 151 lb 3.2 oz (68.6 kg)  Height: 5\' 3"  (1.6 m)   Body mass index is 26.78 kg/m. Physical Exam Vitals signs reviewed.  Constitutional:      General: She is not in acute distress.    Appearance: She is overweight. She is not ill-appearing.  HENT:     Head: Normocephalic.     Right Ear: Tympanic membrane, ear canal and external ear normal. There is no impacted cerumen.     Left Ear: Tympanic membrane, ear canal and external ear normal. There is no impacted cerumen.     Nose: Nose normal. No congestion or rhinorrhea.     Mouth/Throat:     Mouth: Mucous membranes are moist.     Pharynx:  Oropharynx is clear. No oropharyngeal exudate or posterior oropharyngeal erythema.  Eyes:     General: No scleral icterus.       Right eye: No discharge.        Left eye: No discharge.     Extraocular Movements: Extraocular movements intact.     Pupils: Pupils are equal, round, and reactive to light.     Comments: Left eye conjunctiva slight redness noted   Neck:     Musculoskeletal: Normal range of motion. No neck rigidity or muscular tenderness.     Vascular: No carotid bruit.  Cardiovascular:     Rate and Rhythm: Normal rate and regular rhythm.     Pulses: Normal pulses.     Heart sounds: Normal heart sounds. No murmur. No friction rub. No gallop.   Pulmonary:     Effort: Pulmonary effort is normal. No respiratory distress.     Breath sounds: Normal breath sounds. No wheezing, rhonchi or rales.  Chest:     Chest wall: No tenderness.  Abdominal:     General: Bowel sounds are normal. There is no distension.     Palpations: Abdomen is soft. There is no mass.     Tenderness: There is no abdominal tenderness. There is no right CVA tenderness, left CVA tenderness, guarding or rebound.  Lymphadenopathy:     Cervical: No cervical adenopathy.  Skin:    General: Skin is warm and dry.     Coloration: Skin is not pale.     Findings: No erythema or rash.     Comments: Left face,left anterior upper arm and left lower back/gluteal area yellow-purplish none tender to touch.    Neurological:     Mental Status: She is alert.  Psychiatric:        Mood and Affect: Mood  normal.        Speech: Speech normal.        Behavior: Behavior normal.        Thought Content: Thought content normal.        Cognition and Memory: Memory is impaired.        Judgment: Judgment normal.     Labs reviewed: Recent Labs    01/29/18 1408 12/04/18 1245 12/13/18 1048  NA 140 137 140  K 4.2 3.9 3.8  CL 102 100 105  CO2 28 30 27   GLUCOSE 84 107* 120*  BUN 27* 23 27*  CREATININE 1.03* 0.86 0.72  CALCIUM  10.1 10.1 9.6   Recent Labs    01/29/18 1408 12/04/18 1245  AST 21 17  ALT 13 16  ALKPHOS  --  77  BILITOT 0.3 0.4  PROT 7.3 7.6  ALBUMIN  --  4.4   Recent Labs    01/29/18 1408 12/04/18 1245 12/13/18 1048  WBC 7.6 4.8 8.0  NEUTROABS 4,438  --  5.4  HGB 12.6 13.3 13.4  HCT 38.8 39.2 40.9  MCV 92.8 93.2 96.2  PLT 225 226.0 211   Lab Results  Component Value Date   TSH 1.35 09/04/2017   Lab Results  Component Value Date   HGBA1C 6.0 (H) 03/07/2018   Lab Results  Component Value Date   CHOL 183 09/06/2017   HDL 63 09/06/2017   LDLCALC 99 09/06/2017   LDLDIRECT 65.7 07/08/2010   TRIG 113 09/06/2017   CHOLHDL 2.9 09/06/2017    Significant Diagnostic Results in last 30 days:  Dg Chest 1 View  Result Date: 12/13/2018 CLINICAL DATA:  Fall onto left side EXAM: CHEST  1 VIEW COMPARISON:  04/29/2017 FINDINGS: The heart size and mediastinal contours are stable. No focal airspace consolidation, pleural effusion, or pneumothorax. No acute osseous abnormality identified. IMPRESSION: No acute cardiopulmonary findings. Electronically Signed   By: Davina Poke M.D.   On: 12/13/2018 11:49   Dg Pelvis 1-2 Views  Result Date: 12/13/2018 CLINICAL DATA:  Fall onto left side, pain EXAM: PELVIS - 1-2 VIEW COMPARISON:  12/04/2014 FINDINGS: There is no evidence of pelvic fracture or diastasis. No pelvic bone lesions are seen. Joint space narrowing of the bilateral hips. IMPRESSION: No acute osseous abnormality of the pelvis. Electronically Signed   By: Davina Poke M.D.   On: 12/13/2018 11:51   Dg Abd 1 View  Result Date: 12/04/2018 CLINICAL DATA:  Abdominal pain, constipation EXAM: ABDOMEN - 1 VIEW COMPARISON:  CT 10/29/2018.  Plain film 10/10/2012 FINDINGS: Prior cholecystectomy. Nonobstructive bowel gas pattern. Moderate stool burden throughout the colon. No organomegaly, suspicious calcification or free air. IMPRESSION: No acute findings.  Moderate stool burden.  Electronically Signed   By: Rolm Baptise M.D.   On: 12/04/2018 16:38   Ct Head Wo Contrast  Result Date: 12/13/2018 CLINICAL DATA:  Fall, facial trauma EXAM: CT HEAD WITHOUT CONTRAST CT MAXILLOFACIAL WITHOUT CONTRAST TECHNIQUE: Multidetector CT imaging of the head and maxillofacial structures were performed using the standard protocol without intravenous contrast. Multiplanar CT image reconstructions of the maxillofacial structures were also generated. COMPARISON:  CT head 04/19/2015 FINDINGS: CT HEAD FINDINGS Brain: Old right frontal and parietal infarcts, stable. There is atrophy and chronic small vessel disease changes. No acute intracranial abnormality. Specifically, no hemorrhage, hydrocephalus, mass lesion, acute infarction, or significant intracranial injury. Vascular: No hyperdense vessel or unexpected calcification. Skull: No acute calvarial abnormality. Other: None CT MAXILLOFACIAL FINDINGS Osseous: No facial fracture. Orbits:  Soft tissue swelling over the left orbit laterally. Globes are intact. No orbital fracture. Sinuses: Postoperative changes in the right paranasal sinuses. No air-fluid levels. Soft tissues: Soft tissue swelling over the lateral left orbit and face. IMPRESSION: Old right MCA infarcts, stable. Atrophy, chronic microvascular disease. No acute intracranial abnormality. No evidence of facial or orbital fracture. Electronically Signed   By: Rolm Baptise M.D.   On: 12/13/2018 11:40   Dg Shoulder Left  Result Date: 12/13/2018 CLINICAL DATA:  Fall onto left side, pain EXAM: LEFT SHOULDER - 2+ VIEW COMPARISON:  03/01/2015 FINDINGS: No acute fracture or dislocation. Mild degenerative changes of the glenohumeral and acromioclavicular joints. No focal soft tissue abnormality. IMPRESSION: No acute fracture or dislocation, left shoulder. Electronically Signed   By: Davina Poke M.D.   On: 12/13/2018 11:50   Ct Maxillofacial Wo Cm  Result Date: 12/13/2018 CLINICAL DATA:  Fall,  facial trauma EXAM: CT HEAD WITHOUT CONTRAST CT MAXILLOFACIAL WITHOUT CONTRAST TECHNIQUE: Multidetector CT imaging of the head and maxillofacial structures were performed using the standard protocol without intravenous contrast. Multiplanar CT image reconstructions of the maxillofacial structures were also generated. COMPARISON:  CT head 04/19/2015 FINDINGS: CT HEAD FINDINGS Brain: Old right frontal and parietal infarcts, stable. There is atrophy and chronic small vessel disease changes. No acute intracranial abnormality. Specifically, no hemorrhage, hydrocephalus, mass lesion, acute infarction, or significant intracranial injury. Vascular: No hyperdense vessel or unexpected calcification. Skull: No acute calvarial abnormality. Other: None CT MAXILLOFACIAL FINDINGS Osseous: No facial fracture. Orbits: Soft tissue swelling over the left orbit laterally. Globes are intact. No orbital fracture. Sinuses: Postoperative changes in the right paranasal sinuses. No air-fluid levels. Soft tissues: Soft tissue swelling over the lateral left orbit and face. IMPRESSION: Old right MCA infarcts, stable. Atrophy, chronic microvascular disease. No acute intracranial abnormality. No evidence of facial or orbital fracture. Electronically Signed   By: Rolm Baptise M.D.   On: 12/13/2018 11:40    Assessment/Plan 1. Unsteady gait Worsening balance.Has has multiple falls.ambulates with walker.will have her work with Physical therapy for ROM,exercise,gait stability and muscle strengthening.  - Ambulatory referral to [facility]  2. Fall, subsequent encounter Status post ED due to fall in the bathroom.No head injury or loss of consciousness reported.Patient lives alone at home and has care giver during the day but none at night.Will Nurse assistance 24 hours for assistance with her Activity of Daily Living.Higher level of care in [place] will be beneficial for patient but patient decline transfer to assisted Living  for now.  - Ambulatory referral to [facility] for CNA to assist with ADL's and safety.   3. Bruise of face, subsequent encounter Progressive healing of bruise son the face,left anterior upper arm,left lower back/gluteal areas.No tenderness or signs of infection.   4. Urinary tract infection  Afebrile.U/a positive for nitrite and culture indicated > 100,000 colonies of [employer].de Keflex 500 mg capsule three times daily as directed in the ED.encouraged to increase fluid intake.   Family/ staff Communication: Reviewed plan of care with patient and Care giver.  Labs/tests ordered: None   Dinah C Ngetich, NP

## 2018-12-20 ENCOUNTER — Encounter: Payer: Self-pay | Admitting: Family

## 2018-12-20 DIAGNOSIS — R519 Headache, unspecified: Secondary | ICD-10-CM

## 2018-12-20 NOTE — Telephone Encounter (Signed)
Opened in error  This encounter was created in error - please disregard.  This encounter was created in error - please disregard.

## 2018-12-20 NOTE — Telephone Encounter (Signed)
Patient daughter requested refill [place] Verified LR: 11/19/2018 Pended Rx and sent to [facility] for approval ([name] out of office)

## 2018-12-24 ENCOUNTER — Telehealth: Payer: Self-pay | Admitting: *Deleted

## 2018-12-24 ENCOUNTER — Other Ambulatory Visit: Payer: Self-pay | Admitting: Internal Medicine

## 2018-12-24 NOTE — Telephone Encounter (Signed)
Patient request refill for alprazolam 0.25 mg tablet   Verified correct pharmacy.  Verified in [place] database last refill was received 11/19/2018    Last visit was 12/16/2018  Next appointment is 11/10/19

## 2018-12-24 NOTE — Telephone Encounter (Signed)
12/24/2018 1:28 pm TOC CM received call from dtr, [name], [phone], dtr states [facility] came out on Friday, and stated they could not do anything for pt and dtr states she has not heard back from RN. CM contacted [facility] liaison, Ronalee Belts to follow up. Explained to dtr that CM will give her a call back. [place], [place] ED TOC CM [phone]

## 2018-12-24 NOTE — Telephone Encounter (Signed)
Let's get her a routine visit in January with me, please.  Apparently that one in 2021 must be a wellness visit.  She's only had acute visits since April of this year.

## 2018-12-25 ENCOUNTER — Telehealth: Payer: Self-pay

## 2018-12-25 NOTE — Telephone Encounter (Signed)
Noted.  I was out of the office last week.

## 2018-12-25 NOTE — Telephone Encounter (Signed)
Called patient's daughter back and discussed provider's response. Daughter verbalized understanding and states she tried to get medication filled last Friday but was never filled. Did not get medication filled until yesterday. I did apologize to her and let her know we were sorry it was not filled sooner and that I would let provider know of information. She states she never intended for the patient to miss taking medication. Let her know to please call us of any other questions or concerns she may have. She denied further questions.

## 2018-12-25 NOTE — Telephone Encounter (Signed)
Thanks.   Please call her daughter back and let her know:  Likely she was withdrawing from the alprazolam that she has taken regularly for years.  That could have caused delirium with hallucinations.  She should not run out.

## 2018-12-25 NOTE — Telephone Encounter (Signed)
Spoke with patient's daughter today and informed her medication had been approved but we needed to schedule a follow up appointment in January for patient. She was in agreement and scheduled follow up appointment.   Daughter went on to say she is concerned with her mom since fall. She states her mom has had more confusion and has been talking to people who are no longer here. States she has been also telling care givers she is seeing people who are deceased. Daughter states patient has not had alprazolam in a few days now and wondered if this may have anything to do with it. Offered her an appointment to bring patient in to see provider. She refused to make an appointment at this time. States she and caregiver are keeping eye on situation and if she feels it gets worse she will call us.

## 2018-12-25 NOTE — Telephone Encounter (Signed)
Appointment scheduled for patient in January

## 2018-12-26 ENCOUNTER — Ambulatory Visit: Payer: Medicare Other | Admitting: Internal Medicine

## 2019-01-01 ENCOUNTER — Other Ambulatory Visit: Payer: Self-pay | Admitting: Internal Medicine

## 2019-01-01 DIAGNOSIS — M545 Low back pain, unspecified: Secondary | ICD-10-CM

## 2019-01-01 DIAGNOSIS — G6289 Other specified polyneuropathies: Secondary | ICD-10-CM

## 2019-01-02 ENCOUNTER — Other Ambulatory Visit: Payer: Self-pay | Admitting: *Deleted

## 2019-01-02 ENCOUNTER — Other Ambulatory Visit: Payer: Self-pay | Admitting: Internal Medicine

## 2019-01-02 DIAGNOSIS — M545 Low back pain, unspecified: Secondary | ICD-10-CM

## 2019-01-02 DIAGNOSIS — G6289 Other specified polyneuropathies: Secondary | ICD-10-CM

## 2019-01-02 NOTE — Telephone Encounter (Signed)
Daughter has requested that Lyrica be called to the pharmacy for patient. Phoned to pharmacy.

## 2019-01-03 ENCOUNTER — Other Ambulatory Visit: Payer: Self-pay | Admitting: Internal Medicine

## 2019-01-03 NOTE — Telephone Encounter (Signed)
Linzess refilled , at last visit was told to continue.

## 2019-01-13 ENCOUNTER — Other Ambulatory Visit: Payer: Self-pay | Admitting: Internal Medicine

## 2019-01-22 ENCOUNTER — Other Ambulatory Visit: Payer: Self-pay

## 2019-01-22 MED ORDER — ALPRAZOLAM 0.25 MG PO TABS: 0.2500 mg | ORAL_TABLET | Freq: Two times a day (BID) | ORAL | 0 refills | Status: DC | PRN

## 2019-01-22 NOTE — Telephone Encounter (Signed)
Caregiver would not be an authorized individual to sign the contract, unfortunately.  It would have to be her daughter.  This will need to be completed at next in-person appt.

## 2019-01-22 NOTE — Telephone Encounter (Signed)
Verified correct pharmacy and last refill was 12/24/2018. Spoke with Daugher concerning controlled medication contract needing to be updated. Daughter states she is unable to get out of house due to health issue and her husband right now is also limited on places he can go. She would like to know if it would be possible for patient's care giver to sign contract on the day she comes for visit. She states mom has dementia and would not understand contract.   Routing to provider for approval or refill and to advise.

## 2019-01-23 NOTE — Telephone Encounter (Signed)
Tried to call daughter to inform of provider's response. No answer, Unable to leave message will try again at later time.

## 2019-01-23 NOTE — Telephone Encounter (Signed)
Spoke with daughter and she states due to her spine issues she is unable to come into office with her mom. She also states her daughter is due to have baby and is under isolation after baby is born some time near end of December. She did state her husband could possibly come in to sign contract but would not be able to stay for appointment.   Please advise.

## 2019-01-23 NOTE — Telephone Encounter (Signed)
Called and spoke with daughter. Discussed provider's response. Daughter was appreciative and scheduled telephone visit with [name]. States she is not able to be present with patient during telephone visit. She states if this is not possible please let them know at her mom's office visit on 02/19/2018.

## 2019-01-24 NOTE — Telephone Encounter (Signed)
It will need to be a 3 way call with caregiver/patient and daughter in order to fulfill rules for insurance coverage of the visit.  Please note that on the appt.

## 2019-01-27 ENCOUNTER — Telehealth: Payer: Self-pay | Admitting: Rheumatology

## 2019-01-27 NOTE — Telephone Encounter (Signed)
Advised patient's daughter to contact patient's PCP about the pain with walking. Patient verbalized understanding.

## 2019-01-27 NOTE — Telephone Encounter (Signed)
Patient's daughter called stating her mom has been having a lot of pain with walking.  Patient declined a virtual appointment and rescheduled for 03/18/18, but is also asking if there is something Dr. Estanislado Pandy would recommend for her pain.

## 2019-01-28 ENCOUNTER — Encounter

## 2019-01-28 ENCOUNTER — Ambulatory Visit: Payer: Medicare Other | Admitting: Podiatry

## 2019-01-28 ENCOUNTER — Encounter: Payer: Self-pay | Admitting: Podiatry

## 2019-01-28 ENCOUNTER — Other Ambulatory Visit: Payer: Self-pay

## 2019-01-28 DIAGNOSIS — E1142 Type 2 diabetes mellitus with diabetic polyneuropathy: Secondary | ICD-10-CM

## 2019-01-28 DIAGNOSIS — M2042 Other hammer toe(s) (acquired), left foot: Secondary | ICD-10-CM

## 2019-01-28 DIAGNOSIS — M79675 Pain in left toe(s): Secondary | ICD-10-CM

## 2019-01-28 DIAGNOSIS — M2041 Other hammer toe(s) (acquired), right foot: Secondary | ICD-10-CM

## 2019-01-28 DIAGNOSIS — E1140 Type 2 diabetes mellitus with diabetic neuropathy, unspecified: Secondary | ICD-10-CM | POA: Insufficient documentation

## 2019-01-28 DIAGNOSIS — B351 Tinea unguium: Secondary | ICD-10-CM

## 2019-01-28 NOTE — Progress Notes (Signed)
This patient presents to the office with chief complaint of long thick nails and diabetic feet.  This patient  says there  is  no pain and discomfort in her  feet.  This patient says there are long thick painful nails.  These nails are painful walking and wearing shoes.  Patient has no history of infection or drainage from both feet.  Patient is unable to  self treat his own nails . Patient does relate having her big toenail left foot removed previously. This patient presents  to the office today for treatment of the  long nails and a foot evaluation due to history of  diabetes.  General Appearance  Alert, conversant and in no acute stress.  Vascular  Dorsalis pedis and posterior tibial  pulses are palpable  bilaterally.  Capillary return is within normal limits  bilaterally. Temperature is within normal limits  bilaterally.  Neurologic  Senn-Weinstein monofilament wire test absent   bilaterally. Muscle power within normal limits bilaterally.  Nails Thick disfigured discolored nails with subungual debris  from hallux to fifth toes bilaterally except left hallux.. No evidence of bacterial infection or drainage bilaterally.  Orthopedic  No limitations of motion of motion feet .  No crepitus or effusions noted.  Hammer toes 2  B/L.  HAV 1st MPJ  B/L.  Skin  normotropic skin with no porokeratosis noted bilaterally.  No signs of infections or ulcers noted.     Onychomycosis  Diabetes with neuropathy.  IE  Debride nails x 9.  A diabetic foot exam was performed and there is no evidence of any vascular  pathology. Neuropathy both feet.   RTC 3 months.   Gardiner Barefoot DPM

## 2019-01-30 ENCOUNTER — Ambulatory Visit: Payer: Self-pay | Admitting: Rheumatology

## 2019-02-03 ENCOUNTER — Other Ambulatory Visit: Payer: Self-pay | Admitting: *Deleted

## 2019-02-03 ENCOUNTER — Telehealth: Payer: Self-pay | Admitting: Rheumatology

## 2019-02-03 DIAGNOSIS — M797 Fibromyalgia: Secondary | ICD-10-CM

## 2019-02-03 DIAGNOSIS — G6289 Other specified polyneuropathies: Secondary | ICD-10-CM

## 2019-02-03 DIAGNOSIS — F341 Dysthymic disorder: Secondary | ICD-10-CM

## 2019-02-03 DIAGNOSIS — M545 Low back pain, unspecified: Secondary | ICD-10-CM

## 2019-02-03 MED ORDER — FUROSEMIDE 40 MG PO TABS: 40.0000 mg | ORAL_TABLET | Freq: Every day | ORAL | 0 refills | Status: DC

## 2019-02-03 MED ORDER — PILOCARPINE HCL 5 MG PO TABS: 5.0000 mg | ORAL_TABLET | Freq: Two times a day (BID) | ORAL | 0 refills | Status: DC

## 2019-02-03 MED ORDER — DULOXETINE HCL 60 MG PO CPEP: 60.0000 mg | ORAL_CAPSULE | Freq: Every day | ORAL | 0 refills | Status: DC

## 2019-02-03 MED ORDER — DULOXETINE HCL 30 MG PO CPEP: ORAL_CAPSULE | ORAL | 0 refills | Status: DC

## 2019-02-03 MED ORDER — PREGABALIN 100 MG PO CAPS: 100.0000 mg | ORAL_CAPSULE | Freq: Two times a day (BID) | ORAL | 0 refills | Status: DC

## 2019-02-03 NOTE — Telephone Encounter (Signed)
Patient's daughter Terena called requesting prescription refill of Pilocarpine to be sent to [place] at [facility].

## 2019-02-03 NOTE — Telephone Encounter (Signed)
Last Visit: 01/29/2018 Next Visit:03/19/2019  Okay to refill per Dr. Estanislado Pandy.

## 2019-02-03 NOTE — Telephone Encounter (Signed)
Patient daughter, Erleen requested refills.  Pended and sent to [name] for approval due to [place].

## 2019-02-10 ENCOUNTER — Other Ambulatory Visit: Payer: Self-pay

## 2019-02-10 MED ORDER — LINACLOTIDE 290 MCG PO CAPS: ORAL_CAPSULE | ORAL | 4 refills | Status: DC

## 2019-02-10 NOTE — Telephone Encounter (Signed)
Linzess refilled as pharmacy requested. 

## 2019-02-13 ENCOUNTER — Other Ambulatory Visit: Payer: Self-pay

## 2019-02-13 MED ORDER — LINACLOTIDE 290 MCG PO CAPS: ORAL_CAPSULE | ORAL | 4 refills | Status: DC

## 2019-02-13 NOTE — Telephone Encounter (Signed)
Linzess refilled as pharmacy requested. 

## 2019-02-20 ENCOUNTER — Encounter: Payer: Self-pay | Admitting: Internal Medicine

## 2019-02-20 ENCOUNTER — Ambulatory Visit (INDEPENDENT_AMBULATORY_CARE_PROVIDER_SITE_OTHER): Payer: Medicare Other | Admitting: Internal Medicine

## 2019-02-20 ENCOUNTER — Other Ambulatory Visit: Payer: Self-pay

## 2019-02-20 VITALS — BP 120/66 | HR 83 | Temp 97.3°F | Ht 63.0 in | Wt 153.0 lb

## 2019-02-20 DIAGNOSIS — B370 Candidal stomatitis: Secondary | ICD-10-CM

## 2019-02-20 DIAGNOSIS — E0865 Diabetes mellitus due to underlying condition with hyperglycemia: Secondary | ICD-10-CM | POA: Diagnosis not present

## 2019-02-20 DIAGNOSIS — N3281 Overactive bladder: Secondary | ICD-10-CM

## 2019-02-20 DIAGNOSIS — Z9189 Other specified personal risk factors, not elsewhere classified: Secondary | ICD-10-CM

## 2019-02-20 DIAGNOSIS — F0150 Vascular dementia without behavioral disturbance: Secondary | ICD-10-CM

## 2019-02-20 DIAGNOSIS — J3489 Other specified disorders of nose and nasal sinuses: Secondary | ICD-10-CM | POA: Diagnosis not present

## 2019-02-20 MED ORDER — NYSTATIN 100000 UNIT/ML MT SUSP: 5.0000 mL | Freq: Four times a day (QID) | OROMUCOSAL | 0 refills | Status: DC

## 2019-02-20 MED ORDER — MUPIROCIN 2 % EX OINT: 1.0000 "application " | TOPICAL_OINTMENT | Freq: Two times a day (BID) | CUTANEOUS | 0 refills | Status: DC

## 2019-02-20 NOTE — Progress Notes (Signed)
Location:  [facility] clinic  Provider: Dr. Hollace Kinnier  Goals of Care:  Advanced Directives 02/20/2019  Does Patient Have a Medical Advance Directive? Yes  Type of Advance Directive [place]  Does patient want to make changes to medical advance directive? No - Patient declined  Copy of [place] in Chart? Yes - validated most recent copy scanned in chart (See row information)  Would patient like information on creating a medical advance directive? No - Patient declined  Pre-existing out of facility DNR order (yellow form or pink MOST form) -     Chief Complaint  Patient presents with  . Medication Management    Follow up on controlled medication and  to sign  pain  contract. Eduarda Scrivens/ caregiver is with her .     HPI: Patient is a 86 y.o. female seen today for medical management of chronic diseases.    Caregiver, Enslie Sahota present for visit.   She has fallen multiple times since Christmas. Uses a four-point cane or standard to ambulate.   Eats at least 2 meals a day. Caregiver states she drinks 6 glasses of water daily. She also has 1 glucerna daily. Drinking cranberry juice daily and taking cranberry supplement to prevent recurrent UTI.   Still having chronic back pain. She takes 1000 mg tylenol BID. Does not think tylenol helps pain. Also does the salonopas patches and uses heating pads.   Anxiety occurs daily. She will take xanax everyday at night. Her daughter administers xanax to her.   Still experiencing right sided abdominal pain. Had imaging done a few months ago. Specialist suspected constipation. She is taking miralax daily to help and having normal bowel movements. Has a history of multiple abdominal surgeries.   Caregiver states memory issues have not changed. No behavioral outbursts or hallucinations.    She complains her mouth is very painful with sores. At times she has trouble swallowing. Her lips are also dry. She uses biotene swishes and  applies carmex to her lips. Her  Mouth is very bothersome and she would like to try something to help relieve the sores.   Her right nare has a bump. She has had it for a couple weeks. At times it is painful.   She has questions about who her HPOA is. States she had a Psychologist, clinical papers in the past but cannot locate them. She cannot remember if she designated her daughter as HPOA medically or financially.         Past Medical History:  Diagnosis Date  . Abnormality of gait   . Adenomatous polyp of colon 2002   64mm  . Allergic rhinitis   . Anxiety   . Anxiety and depression   . Chronic back pain   . Dementia without behavioral disturbance ([place])   . Depression   . Diverticulosis of colon   . Dry eye syndrome   . Dysphagia   . Dysthymic disorder   . Fibromyalgia   . GERD (gastroesophageal reflux disease)   . H/O hiatal hernia   . History of adverse drug reaction   . History of cerebrovascular disease 09/24/2014  . History of recurrent UTIs   . Hypertension, benign   . Irritable bowel syndrome   . Low back pain syndrome   . Memory loss   . Mitral valve prolapse   . Paroxysmal A-fib ([place])   . Peripheral neuropathy    "both feet and legs"  . Physical deconditioning   .  Sjogren's syndrome ([place])   . Therapeutic opioid-induced constipation (OIC)   . Thyroid nodule   . Urinary incontinence     Past Surgical History:  Procedure Laterality Date  . ABDOMINAL HYSTERECTOMY  1967  . APPENDECTOMY    . CARDIAC CATHETERIZATION  02/17/2003   normal L main, LAD free of disease, Cfx free of disease, RCA free of disease (Dr. Loni Muse. [place])  . CATARACT EXTRACTION, BILATERAL    . CHOLECYSTECTOMY  2000  . COLONOSCOPY W/ BIOPSIES     multiple  . DENTAL SURGERY     multiple tooth extractions  . ESOPHAGOGASTRODUODENOSCOPY (EGD) WITH ESOPHAGEAL DILATION N/A 08/23/2012   Procedure: ESOPHAGOGASTRODUODENOSCOPY (EGD) WITH ESOPHAGEAL DILATION;  Surgeon: Milus Banister, MD;  Location: [facility]  ENDOSCOPY;  Service: Endoscopy;  Laterality: N/A;  . NASAL SEPTUM SURGERY  1980  . NM MYOCAR PERF WALL MOTION  2003   persantine - normal static and dynamic study w/apical thinning and presvered LV function, no ischemia  . SINUS EXPLORATION     ossifiying fibroma  . TEMPOROMANDIBULAR JOINT SURGERY  1986   Dr. Terence Lux  . TRANSTHORACIC ECHOCARDIOGRAM  2001   mild LVH, normal LV    Allergies  Allergen Reactions  . Banana Nausea And Vomiting  . Codeine Nausea Only    unless given with Phenergan  . Klonopin [Clonazepam] Other (See Comments)    Causes hallucination   . Meperidine Hcl Nausea Only    unless given with Phenergan  . Norflex [Orphenadrine Citrate] Nausea Only    Unless given with Phenergan  . Oxycodone-Acetaminophen Nausea Only    unless given with phenergan  . Propoxyphene Hcl Nausea Only    unless given with phenergan  . Zoloft [Sertraline Hcl] Other (See Comments)    Caused lethargy  . Doxycycline Other (See Comments)    Unknown reaction  . Naproxen Other (See Comments)    Unknown reaction  . Penicillins Other (See Comments)    Unknown reaction Has patient had a PCN reaction causing immediate rash, facial/tongue/throat swelling, SOB or lightheadedness with hypotension: Unknown Has patient had a PCN reaction causing severe rash involving mucus membranes or skin necrosis: Unknown Has patient had a PCN reaction that required hospitalization: pt was in the hospital at time of reaction Has patient had a PCN reaction occurring within the last 10 years: Unknown If all of the above answers are "NO", then may proceed with Cephalos  . Phenothiazines Other (See Comments)    Unknown reaction  . Stelazine Other (See Comments)    Unknown reaction  . Sulfamethoxazole-Trimethoprim Other (See Comments)    Unknown reaction  . Tolectin [Tolmetin Sodium] Other (See Comments)    Unknown reaction  . Tramadol Other (See Comments)    Unknown reaction    Outpatient Encounter  Medications as of 02/20/2019  Medication Sig  . acetaminophen (TYLENOL) 500 MG tablet Take 1,000 mg by mouth 2 (two) times daily.  Marland Kitchen ALPRAZolam (XANAX) 0.25 MG tablet Take 1 tablet (0.25 mg total) by mouth 2 (two) times daily as needed.  Marland Kitchen antiseptic oral rinse (BIOTENE) LIQD 15 mLs by Mouth Rinse route 5 (five) times daily as needed for dry mouth.  Marland Kitchen aspirin 325 MG tablet Take 1 tablet (325 mg total) by mouth daily.  . Capsaicin-Menthol-Methyl Sal (CAPSAICIN-METHYL SAL-MENTHOL) 0.025-1-12 % CREA Apply 1 application topically 2 (two) times daily.  . carboxymethylcellulose (REFRESH TEARS) 0.5 % SOLN Place 1 drop into both eyes 5 (five) times daily as needed (dry eyes).   Marland Kitchen  cephALEXin (KEFLEX) 500 MG capsule Take 1 capsule (500 mg total) by mouth 3 (three) times daily.  Marland Kitchen CRANBERRY PO Take 2 tablets by mouth daily.  Marland Kitchen diltiazem (CARTIA XT) 180 MG 24 hr capsule Take 1 capsule (180 mg total) by mouth daily.  . DULoxetine (CYMBALTA) 30 MG capsule Take one capsule by mouth once daily for back pain and for depression.  . DULoxetine (CYMBALTA) 60 MG capsule Take 1 capsule (60 mg total) by mouth daily.  . furosemide (LASIX) 40 MG tablet Take 1 tablet (40 mg total) by mouth daily.  . hydrocortisone (ANUSOL-HC) 2.5 % rectal cream Place 1 application rectally daily. Use for 10 days  . lansoprazole (PREVACID) 30 MG capsule TAKE 1 CAPSULE BY MOUTH ONCE DAILY AT NOON  . linaclotide (LINZESS) 290 MCG CAPS capsule TAKE 1 CAPSULE BY MOUTH EVERY DAY AT BEDTIME  . memantine (NAMENDA) 10 MG tablet Take 1 tablet by mouth twice daily  . MYRBETRIQ 50 MG TB24 tablet Take 1 tablet by mouth once daily  . ONE TOUCH LANCETS MISC Use to test blood sugar twice daily.  Dx: E11.9  . ONETOUCH VERIO test strip USE ONE STRIP TO CHECK GLUCOSE TWICE DAILY AS DIRECTED  . pilocarpine (SALAGEN) 5 MG tablet Take 1 tablet (5 mg total) by mouth 2 (two) times daily.  . polyethylene glycol (MIRALAX / GLYCOLAX) packet Take by mouth daily as  needed (constipation). Mix 1/2 capful of miralax in 8 oz liquid and drink  . potassium chloride SA (K-DUR) 20 MEQ tablet Take 1 tablet by mouth once daily  . pregabalin (LYRICA) 100 MG capsule Take 1 capsule (100 mg total) by mouth 2 (two) times daily.  . sitaGLIPtin (JANUVIA) 100 MG tablet Take 1 tablet (100 mg total) by mouth daily.  . sodium chloride (OCEAN) 0.65 % SOLN nasal spray Place 1 spray into the nose as needed for congestion.  . topiramate (TOPAMAX) 25 MG tablet Take 1 tablet (25 mg total) by mouth daily.   No facility-administered encounter medications on file as of 02/20/2019.    Review of Systems:  Review of Systems  Constitutional: Negative for activity change, appetite change and fatigue.  HENT: Positive for hearing loss and trouble swallowing.        Dry mouth and lips. Mouth sores.   Eyes: Negative for photophobia and visual disturbance.  Respiratory: Negative for cough, shortness of breath and wheezing.   Cardiovascular: Negative for chest pain and palpitations.  Gastrointestinal: Positive for abdominal pain, constipation and diarrhea. Negative for nausea.       RLQ pain  Endocrine: Negative for polydipsia, polyphagia and polyuria.  Genitourinary: Positive for frequency. Negative for dysuria, hematuria and vaginal bleeding.  Musculoskeletal: Positive for arthralgias, back pain and myalgias.  Skin:       Right brow and cheek bruising and swelling post fall  Neurological: Positive for tremors and weakness. Negative for dizziness and headaches.  Psychiatric/Behavioral: Negative for dysphoric mood and sleep disturbance. The patient is not nervous/anxious.     Health Maintenance  Topic Date Due  . OPHTHALMOLOGY EXAM  07/04/1939  . HEMOGLOBIN A1C  09/05/2018  . MAMMOGRAM  09/12/2019  . FOOT EXAM  01/28/2020  . TETANUS/TDAP  12/09/2023  . INFLUENZA VACCINE  Completed  . DEXA SCAN  Completed  . PNA vac Low Risk Adult  Completed  . URINE MICROALBUMIN  Discontinued     Physical Exam: Vitals:   02/20/19 1454  BP: 118/74  Pulse: 83  Temp: (!) 97.3  F (36.3 C)  TempSrc: Temporal  SpO2: 93%  Weight: 153 lb (69.4 kg)  Height: 5\' 3"  (1.6 m)   Body mass index is 27.1 kg/m. Physical Exam Vitals reviewed.  Constitutional:      General: She is not in acute distress.    Appearance: Normal appearance. She is normal weight.  HENT:     Nose:     Right Turbinates: Enlarged and swollen.     Mouth/Throat:     Mouth: Mucous membranes are dry. Oral lesions present.  Cardiovascular:     Rate and Rhythm: Normal rate. Rhythm irregular.     Pulses: Normal pulses.     Heart sounds: Normal heart sounds. No murmur.  Pulmonary:     Effort: Pulmonary effort is normal. No respiratory distress.     Breath sounds: Normal breath sounds. No wheezing.  Abdominal:     General: Bowel sounds are normal.     Tenderness: There is abdominal tenderness in the right lower quadrant.    Musculoskeletal:     Right lower leg: No edema.     Left lower leg: No edema.  Skin:    General: Skin is warm and dry.     Capillary Refill: Capillary refill takes less than 2 seconds.  Neurological:     Mental Status: She is alert. She is disoriented.     Coordination: Coordination abnormal.     Gait: Gait abnormal.  Psychiatric:        Mood and Affect: Mood normal.        Behavior: Behavior normal.        Cognition and Memory: Memory is impaired.     Labs reviewed: Basic Metabolic Panel: Recent Labs    12/04/18 1245 12/13/18 1048  NA 137 140  K 3.9 3.8  CL 100 105  CO2 30 27  GLUCOSE 107* 120*  BUN 23 27*  CREATININE 0.86 0.72  CALCIUM 10.1 9.6   Liver Function Tests: Recent Labs    12/04/18 1245  AST 17  ALT 16  ALKPHOS 77  BILITOT 0.4  PROT 7.6  ALBUMIN 4.4   No results for input(s): LIPASE, AMYLASE in the last 8760 hours. No results for input(s): AMMONIA in the last 8760 hours. CBC: Recent Labs    12/04/18 1245 12/13/18 1048  WBC 4.8 8.0   NEUTROABS  --  5.4  HGB 13.3 13.4  HCT 39.2 40.9  MCV 93.2 96.2  PLT 226.0 211   Lipid Panel: No results for input(s): CHOL, HDL, LDLCALC, TRIG, CHOLHDL, LDLDIRECT in the last 8760 hours. Lab Results  Component Value Date   HGBA1C 6.0 (H) 03/07/2018    Procedures since last visit: No results found.  Assessment/Plan 1. Oral thrush - patient with main complaint of mouth soreness - small white patches on gumline present - start Nystatin 500,000 units oral 4 times daily, until soreness in mouth resolves - continue to use biotene rinses  2. Nose pain - patient complains of small bump with pain in right nostril - start mupirocin 2%- apply to right nare 2 times daily until bump resolves  3. At risk for polypharmacy - have made multiple attempts to reduce medications without success - daughter objects to medication reduction - have educated patient and caregiver to use controlled medications only when needed - urine drug screen - today  4. Diabetes mellitus due to underlying condition, uncontrolled, with hyperglycemia ([place]) - dietary intake adequate at this time - no recent episodes of hypoglycemia - hemoglobin  A1C- future - complete metabolic panel with GFR-future  5. Overactive bladder - stable at this time - continue Myrbetriq regimen  6. Vascular dementia without behavioral disturbance ([place]) - ongoing - she will repeat stories and often scramble up the timeline - ALF has been recommended in the past , but family does not pursue - continue using four-point cane when ambulating      Labs/tests ordered:  Urine drug screen-today, CBC with differential/platelets, hemoglobin A1C, complete metabolic panel with GFR- future Next appt:  4 month follow up

## 2019-02-24 ENCOUNTER — Other Ambulatory Visit: Payer: Self-pay | Admitting: *Deleted

## 2019-02-24 ENCOUNTER — Ambulatory Visit: Payer: Self-pay | Admitting: Internal Medicine

## 2019-02-24 MED ORDER — ALPRAZOLAM 0.25 MG PO TABS: 0.2500 mg | ORAL_TABLET | Freq: Two times a day (BID) | ORAL | 0 refills | Status: DC | PRN

## 2019-02-24 MED ORDER — MEMANTINE HCL 10 MG PO TABS: 10.0000 mg | ORAL_TABLET | Freq: Two times a day (BID) | ORAL | 1 refills | Status: DC

## 2019-02-24 NOTE — Telephone Encounter (Signed)
Controlled substance contracts are required for all patients on these medications.  That had been discussed prior to the visit and no one else was along that could sign the form.  I was not aware that the medication required a refill.  I do see that it's due based on the database so we'll refill it.

## 2019-02-24 NOTE — Telephone Encounter (Signed)
Daughter calling in asking for refill that was asked for at pt's visit on 02/19/2018. Daughter also upset that a phone visit was cancelled that was scheduled by Melissa on 01/23/2019. I advised to daughter that pt has to be included in the telephone call, and after her visit on 02/19/2018, she didn't need a visit so soon. Daughter wanted to discuss why we had her mother sign a control substance contract??

## 2019-02-25 NOTE — Telephone Encounter (Signed)
.  left message to have patient return my call.  

## 2019-03-03 LAB — PAIN MGMT, ALCOHOL MET. W/CONF, U: Alcohol Metabolites: NEGATIVE ng/mL (ref <500)

## 2019-03-03 LAB — TEST AUTHORIZATION 2

## 2019-03-03 LAB — PAIN MGMT PROF 3 W/O CONF, U

## 2019-03-03 LAB — TEST AUTHORIZATION

## 2019-03-04 ENCOUNTER — Other Ambulatory Visit: Payer: Self-pay

## 2019-03-04 ENCOUNTER — Emergency Department (HOSPITAL_BASED_OUTPATIENT_CLINIC_OR_DEPARTMENT_OTHER): Payer: Medicare Other

## 2019-03-04 ENCOUNTER — Other Ambulatory Visit: Payer: Self-pay | Admitting: Internal Medicine

## 2019-03-04 ENCOUNTER — Encounter: Payer: Self-pay | Admitting: Family

## 2019-03-04 ENCOUNTER — Encounter (HOSPITAL_BASED_OUTPATIENT_CLINIC_OR_DEPARTMENT_OTHER): Payer: Self-pay

## 2019-03-04 ENCOUNTER — Emergency Department (HOSPITAL_BASED_OUTPATIENT_CLINIC_OR_DEPARTMENT_OTHER)
Admission: EM | Admit: 2019-03-04 | Discharge: 2019-03-04 | Disposition: A | Discharge status: Home / Self Care | Payer: Medicare Other | Attending: Emergency Medicine | Admitting: Emergency Medicine

## 2019-03-04 ENCOUNTER — Ambulatory Visit (INDEPENDENT_AMBULATORY_CARE_PROVIDER_SITE_OTHER): Payer: Medicare Other | Admitting: Family

## 2019-03-04 DIAGNOSIS — Z87891 Personal history of nicotine dependence: Secondary | ICD-10-CM | POA: Diagnosis not present

## 2019-03-04 DIAGNOSIS — R109 Unspecified abdominal pain: Secondary | ICD-10-CM | POA: Diagnosis not present

## 2019-03-04 DIAGNOSIS — Z7982 Long term (current) use of aspirin: Secondary | ICD-10-CM | POA: Diagnosis not present

## 2019-03-04 DIAGNOSIS — Z79899 Other long term (current) drug therapy: Secondary | ICD-10-CM | POA: Insufficient documentation

## 2019-03-04 DIAGNOSIS — M545 Low back pain, unspecified: Secondary | ICD-10-CM

## 2019-03-04 DIAGNOSIS — Z8673 Personal history of transient ischemic attack (TIA), and cerebral infarction without residual deficits: Secondary | ICD-10-CM | POA: Insufficient documentation

## 2019-03-04 DIAGNOSIS — K7689 Other specified diseases of liver: Secondary | ICD-10-CM | POA: Insufficient documentation

## 2019-03-04 DIAGNOSIS — Z7984 Long term (current) use of oral hypoglycemic drugs: Secondary | ICD-10-CM | POA: Insufficient documentation

## 2019-03-04 DIAGNOSIS — R140 Abdominal distension (gaseous): Secondary | ICD-10-CM

## 2019-03-04 DIAGNOSIS — R160 Hepatomegaly, not elsewhere classified: Secondary | ICD-10-CM | POA: Diagnosis not present

## 2019-03-04 DIAGNOSIS — G6289 Other specified polyneuropathies: Secondary | ICD-10-CM

## 2019-03-04 DIAGNOSIS — E1140 Type 2 diabetes mellitus with diabetic neuropathy, unspecified: Secondary | ICD-10-CM | POA: Insufficient documentation

## 2019-03-04 DIAGNOSIS — R197 Diarrhea, unspecified: Secondary | ICD-10-CM | POA: Insufficient documentation

## 2019-03-04 DIAGNOSIS — I10 Essential (primary) hypertension: Secondary | ICD-10-CM | POA: Insufficient documentation

## 2019-03-04 DIAGNOSIS — R1030 Lower abdominal pain, unspecified: Secondary | ICD-10-CM | POA: Diagnosis not present

## 2019-03-04 DIAGNOSIS — R110 Nausea: Secondary | ICD-10-CM | POA: Insufficient documentation

## 2019-03-04 DIAGNOSIS — N390 Urinary tract infection, site not specified: Secondary | ICD-10-CM | POA: Insufficient documentation

## 2019-03-04 DIAGNOSIS — R1011 Right upper quadrant pain: Secondary | ICD-10-CM

## 2019-03-04 DIAGNOSIS — F0390 Unspecified dementia without behavioral disturbance: Secondary | ICD-10-CM | POA: Diagnosis not present

## 2019-03-04 LAB — CBC WITH DIFFERENTIAL/PLATELET
Abs Immature Granulocytes: 0.02 10*3/uL (ref 0.00–0.07)
Basophils Absolute: 0 10*3/uL (ref 0.0–0.1)
Basophils Relative: 0 %
Eosinophils Absolute: 0.2 10*3/uL (ref 0.0–0.5)
Eosinophils Relative: 3 %
HCT: 42.2 % (ref 36.0–46.0)
Hemoglobin: 13.7 g/dL (ref 12.0–15.0)
Immature Granulocytes: 0 %
Lymphocytes Relative: 31 %
Lymphs Abs: 2.2 10*3/uL (ref 0.7–4.0)
MCH: 31.2 pg (ref 26.0–34.0)
MCHC: 32.5 g/dL (ref 30.0–36.0)
MCV: 96.1 fL (ref 80.0–100.0)
Monocytes Absolute: 0.7 10*3/uL (ref 0.1–1.0)
Monocytes Relative: 9 %
Neutro Abs: 4 10*3/uL (ref 1.7–7.7)
Neutrophils Relative %: 57 %
Platelets: 207 10*3/uL (ref 150–400)
RBC: 4.39 MIL/uL (ref 3.87–5.11)
RDW: 12.9 % (ref 11.5–15.5)
WBC: 7 10*3/uL (ref 4.0–10.5)
nRBC: 0 % (ref 0.0–0.2)

## 2019-03-04 LAB — COMPREHENSIVE METABOLIC PANEL
ALT: 22 U/L (ref 0–44)
AST: 24 U/L (ref 15–41)
Albumin: 4.5 g/dL (ref 3.5–5.0)
Alkaline Phosphatase: 89 U/L (ref 38–126)
Anion gap: 10 (ref 5–15)
BUN: 25 mg/dL — ABNORMAL HIGH (ref 8–23)
CO2: 26 mmol/L (ref 22–32)
Calcium: 9.9 mg/dL (ref 8.9–10.3)
Chloride: 102 mmol/L (ref 98–111)
Creatinine, Ser: 0.78 mg/dL (ref 0.44–1.00)
GFR calc Af Amer: >60 mL/min (ref >60)
GFR calc non Af Amer: >60 mL/min (ref >60)
Glucose, Bld: 118 mg/dL — ABNORMAL HIGH (ref 70–99)
Potassium: 4.1 mmol/L (ref 3.5–5.1)
Sodium: 138 mmol/L (ref 135–145)
Total Bilirubin: 0.7 mg/dL (ref 0.3–1.2)
Total Protein: 8.1 g/dL (ref 6.5–8.1)

## 2019-03-04 LAB — LIPASE, BLOOD: Lipase: 32 U/L (ref 11–51)

## 2019-03-04 LAB — URINALYSIS, ROUTINE W REFLEX MICROSCOPIC
Bilirubin Urine: NEGATIVE
Glucose, UA: NEGATIVE mg/dL
Hgb urine dipstick: NEGATIVE
Ketones, ur: NEGATIVE mg/dL
Nitrite: POSITIVE — AB
Protein, ur: NEGATIVE mg/dL
Specific Gravity, Urine: 1.02 (ref 1.005–1.030)
pH: 5.5 (ref 5.0–8.0)

## 2019-03-04 LAB — URINALYSIS, MICROSCOPIC (REFLEX)

## 2019-03-04 MED ORDER — CEPHALEXIN 500 MG PO CAPS: 500.0000 mg | ORAL_CAPSULE | Freq: Three times a day (TID) | ORAL | 0 refills | Status: DC

## 2019-03-04 MED ORDER — IOHEXOL 300 MG/ML  SOLN
100.0000 mL | Freq: Once | INTRAMUSCULAR | Status: AC | PRN
  Administered 2019-03-04: 100 mL via INTRAVENOUS

## 2019-03-04 NOTE — Progress Notes (Signed)
Patient ID: Ariana [name], female   DOB: 1929/08/09, 86 y.o.   MRN: [id number] This service is provided via telemedicine  No vital signs collected/recorded due to the encounter was a telemedicine visit.   Location of patient (ex: home, work):  HOME  Patient consents to a telephone visit:  YES  Location of the provider (ex: office, home):  OFFICE  Name of any referring provider:  TIFFANY REED, DO  Names of all persons participating in the telemedicine service and their role in the encounter:  PATIENT, [name], [name], [name], NP  Time spent on call:  5:46   Provider: Jolena Kittle FNP-C  Gayland Curry, DO  Patient Care Team: Gayland Curry, DO as PCP - General (Geriatric Medicine) Noralee Space, MD as Consulting Physician (Pulmonary Disease) Maisie Fus, MD as Consulting Physician (Obstetrics and Gynecology) Pixie Casino, MD as Consulting Physician (Cardiology) Monna Fam, MD as Consulting Physician (Ophthalmology) Kathrynn Ducking, MD as Consulting Physician (Neurology) Franchot Gallo, MD as Consulting Physician (Urology) Lavonna Monarch, MD as Consulting Physician (Dermatology) Gatha Mayer, MD as Consulting Physician (Gastroenterology)  Extended Emergency Contact Information Primary Emergency Contact: Era Skeen Address: [address]          [place], [place] [address] Johnnette Litter of [place] Phone: [phone] Mobile Phone: [phone] Relation: Daughter Secondary Emergency Contact: Arvin Collard [place] Phone: [phone] Relation: Other  Code Status:  Full Code  Goals of care: Advanced Directive information Advanced Directives 02/20/2019  Does Patient Have a Medical Advance Directive? Yes  Type of Advance Directive [place]  Does patient want to make changes to medical advance directive? No - Patient declined  Copy of [place] in Chart? Yes  - validated most recent copy scanned in chart (See row information)  Would patient like information on creating a medical advance directive? No - Patient declined  Pre-existing out of facility DNR order (yellow form or pink MOST form) -     Chief Complaint  Patient presents with  . Acute Visit    ABDOMINAL PAIN, BLOATING, DIARRHEA     HPI:  Pt is a 86 y.o. female seen today for an acute visit for evaluation of abdominal pain and bloating.she states two days ago had issues with constipation but since then she has had some round hard stool.she had another bowel movement earlier today which she describes as loose with flesh looking stuff.she states her abdomen feels bloated.she is more concerned on her right upper quadrant abdomen that is swollen compared to the left side " feels like it's turned".Care Giver present during the visit states the swelling on the right abdomen is prominent which she describes as like " Putting hand over the knee". She denies any fever,chills,nausea or vomiting.she has miralax 17 Gm daily as needed and Linaclotide 290 mcg capsule daily.she states drinks adequate water though not clear how much.she describes appetite as good.    Past Medical History:  Diagnosis Date  . Abnormality of gait   . Adenomatous polyp of colon 2002   32mm  . Allergic rhinitis   . Anxiety   . Anxiety and depression   . Chronic back pain   . Dementia without behavioral disturbance ([place])   . Depression   . Diverticulosis of colon   . Dry eye syndrome   . Dysphagia   . Dysthymic disorder   . Fibromyalgia   . GERD (gastroesophageal reflux disease)   .  H/O hiatal hernia   . History of adverse drug reaction   . History of cerebrovascular disease 09/24/2014  . History of recurrent UTIs   . Hypertension, benign   . Irritable bowel syndrome   . Low back pain syndrome   . Memory loss   . Mitral valve prolapse   . Paroxysmal A-fib ([place])   . Peripheral neuropathy    "both feet and legs"   . Physical deconditioning   . Sjogren's syndrome ([place])   . Therapeutic opioid-induced constipation (OIC)   . Thyroid nodule   . Urinary incontinence    Past Surgical History:  Procedure Laterality Date  . ABDOMINAL HYSTERECTOMY  1967  . APPENDECTOMY    . CARDIAC CATHETERIZATION  02/17/2003   normal L main, LAD free of disease, Cfx free of disease, RCA free of disease (Dr. Loni Muse. [facility])  . CATARACT EXTRACTION, BILATERAL    . CHOLECYSTECTOMY  2000  . COLONOSCOPY W/ BIOPSIES     multiple  . DENTAL SURGERY     multiple tooth extractions  . ESOPHAGOGASTRODUODENOSCOPY (EGD) WITH ESOPHAGEAL DILATION N/A 08/23/2012   Procedure: ESOPHAGOGASTRODUODENOSCOPY (EGD) WITH ESOPHAGEAL DILATION;  Surgeon: Milus Banister, MD;  Location: [facility];  Service: Endoscopy;  Laterality: N/A;  . NASAL SEPTUM SURGERY  1980  . NM MYOCAR PERF WALL MOTION  2003   persantine - normal static and dynamic study w/apical thinning and presvered LV function, no ischemia  . SINUS EXPLORATION     ossifiying fibroma  . TEMPOROMANDIBULAR JOINT SURGERY  1986   Dr. Terence Lux  . TRANSTHORACIC ECHOCARDIOGRAM  2001   mild LVH, normal LV    Allergies  Allergen Reactions  . Banana Nausea And Vomiting  . Codeine Nausea Only    unless given with Phenergan  . Klonopin [Clonazepam] Other (See Comments)    Causes hallucination   . Meperidine Hcl Nausea Only    unless given with Phenergan  . Norflex [Orphenadrine Citrate] Nausea Only    Unless given with Phenergan  . Oxycodone-Acetaminophen Nausea Only    unless given with phenergan  . Propoxyphene Hcl Nausea Only    unless given with phenergan  . Zoloft [Sertraline Hcl] Other (See Comments)    Caused lethargy  . Doxycycline Other (See Comments)    Unknown reaction  . Naproxen Other (See Comments)    Unknown reaction  . Penicillins Other (See Comments)    Unknown reaction Has patient had a PCN reaction causing immediate rash, facial/tongue/throat swelling, SOB or  lightheadedness with hypotension: Unknown Has patient had a PCN reaction causing severe rash involving mucus membranes or skin necrosis: Unknown Has patient had a PCN reaction that required hospitalization: pt was in the hospital at time of reaction Has patient had a PCN reaction occurring within the last 10 years: Unknown If all of the above answers are "NO", then may proceed with Cephalos  . Phenothiazines Other (See Comments)    Unknown reaction  . Stelazine Other (See Comments)    Unknown reaction  . Sulfamethoxazole-Trimethoprim Other (See Comments)    Unknown reaction  . Tolectin [Tolmetin Sodium] Other (See Comments)    Unknown reaction  . Tramadol Other (See Comments)    Unknown reaction    Outpatient Encounter Medications as of 03/04/2019  Medication Sig  . acetaminophen (TYLENOL) 500 MG tablet Take 1,000 mg by mouth 2 (two) times daily.  Marland Kitchen ALPRAZolam (XANAX) 0.25 MG tablet Take 1 tablet (0.25 mg total) by mouth 2 (two) times daily as needed.  Marland Kitchen  antiseptic oral rinse (BIOTENE) LIQD 15 mLs by Mouth Rinse route 5 (five) times daily as needed for dry mouth.  Marland Kitchen aspirin 325 MG tablet Take 1 tablet (325 mg total) by mouth daily.  . Capsaicin-Menthol-Methyl Sal (CAPSAICIN-METHYL SAL-MENTHOL) 0.025-1-12 % CREA Apply 1 application topically 2 (two) times daily.  . carboxymethylcellulose (REFRESH TEARS) 0.5 % SOLN Place 1 drop into both eyes 5 (five) times daily as needed (dry eyes).   . CRANBERRY PO Take 2 tablets by mouth daily.  Marland Kitchen diltiazem (CARTIA XT) 180 MG 24 hr capsule Take 1 capsule (180 mg total) by mouth daily.  . DULoxetine (CYMBALTA) 30 MG capsule Take one capsule by mouth once daily for back pain and for depression.  . DULoxetine (CYMBALTA) 60 MG capsule Take 1 capsule (60 mg total) by mouth daily.  . furosemide (LASIX) 40 MG tablet Take 1 tablet (40 mg total) by mouth daily.  . hydrocortisone (ANUSOL-HC) 2.5 % rectal cream Place 1 application rectally daily. Use for 10 days   . lansoprazole (PREVACID) 30 MG capsule TAKE 1 CAPSULE BY MOUTH ONCE DAILY AT NOON  . linaclotide (LINZESS) 290 MCG CAPS capsule TAKE 1 CAPSULE BY MOUTH EVERY DAY AT BEDTIME  . memantine (NAMENDA) 10 MG tablet Take 1 tablet (10 mg total) by mouth 2 (two) times daily.  . mupirocin ointment (BACTROBAN) 2 % Place 1 application into the nose 2 (two) times daily. Right nostril until soreness resolves  . MYRBETRIQ 50 MG TB24 tablet Take 1 tablet by mouth once daily  . nystatin (MYCOSTATIN) 100000 UNIT/ML suspension Take 5 mLs (500,000 Units total) by mouth 4 (four) times daily. Until soreness of mouth resolves  . ONE TOUCH LANCETS MISC Use to test blood sugar twice daily.  Dx: E11.9  . ONETOUCH VERIO test strip USE ONE STRIP TO CHECK GLUCOSE TWICE DAILY AS DIRECTED  . pilocarpine (SALAGEN) 5 MG tablet Take 1 tablet (5 mg total) by mouth 2 (two) times daily.  . polyethylene glycol (MIRALAX / GLYCOLAX) packet Take by mouth daily as needed (constipation). Mix 1/2 capful of miralax in 8 oz liquid and drink  . potassium chloride SA (K-DUR) 20 MEQ tablet Take 1 tablet by mouth once daily  . pregabalin (LYRICA) 100 MG capsule Take 1 capsule (100 mg total) by mouth 2 (two) times daily.  . sitaGLIPtin (JANUVIA) 100 MG tablet Take 1 tablet (100 mg total) by mouth daily.  Marland Kitchen topiramate (TOPAMAX) 25 MG tablet Take 1 tablet (25 mg total) by mouth daily.  . [DISCONTINUED] cephALEXin (KEFLEX) 500 MG capsule Take 1 capsule (500 mg total) by mouth 3 (three) times daily.  . [DISCONTINUED] sodium chloride (OCEAN) 0.65 % SOLN nasal spray Place 1 spray into the nose as needed for congestion.   No facility-administered encounter medications on file as of 03/04/2019.    Review of Systems  Constitutional: Negative for appetite change, chills, fatigue and fever.  Eyes: Negative for discharge, redness and itching.  Respiratory: Negative for cough, chest tightness, shortness of breath and wheezing.   Cardiovascular: Negative  for chest pain, palpitations and leg swelling.  Gastrointestinal: Positive for abdominal distention, abdominal pain and constipation. Negative for blood in stool, diarrhea, nausea and vomiting.       Right side abdominal pain and bloating.   Skin: Negative for color change, pallor and rash.  Neurological: Negative for dizziness, light-headedness and headaches.  Psychiatric/Behavioral: Negative for agitation. The patient is not nervous/anxious.     Immunization History  Administered Date(s) Administered  .  Fluad Quad(high Dose 65+) 11/08/2018  . Influenza Split 12/26/2010, 01/15/2012  . Influenza Whole 12/30/2008, 11/15/2009  . Influenza, High Dose Seasonal PF 12/21/2016, 11/05/2017  . Influenza,inj,Quad PF,6+ Mos 02/04/2013, 11/03/2013, 12/05/2014, 12/13/2015  . Pneumococcal Conjugate-13 06/12/2014  . Pneumococcal Polysaccharide-23 02/15/2009, 12/05/2014  . Tdap 12/08/2013  . Zoster 01/29/2012   Pertinent  Health Maintenance Due  Topic Date Due  . OPHTHALMOLOGY EXAM  07/04/1939  . HEMOGLOBIN A1C  09/05/2018  . MAMMOGRAM  09/12/2019  . FOOT EXAM  01/28/2020  . INFLUENZA VACCINE  Completed  . DEXA SCAN  Completed  . PNA vac Low Risk Adult  Completed  . URINE MICROALBUMIN  Discontinued   Fall Risk  03/04/2019 02/20/2019 12/16/2018 12/06/2018 11/08/2018  Falls in the past year? 0 1 1 0 1  Number falls in past yr: 0 1 1 1 1   Injury with Fall? 0 1 1 0 0  Comment - Left side of face was injured - - -  Risk Factor Category  - - - - -  Risk for fall due to : - - - - History of fall(s);Impaired balance/gait  Follow up - - - - -    There were no vitals filed for this visit. There is no height or weight on file to calculate BMI. Physical Exam  Unable to complete on telephone visit.   Labs reviewed: Recent Labs    12/04/18 1245 12/13/18 1048  NA 137 140  K 3.9 3.8  CL 100 105  CO2 30 27  GLUCOSE 107* 120*  BUN 23 27*  CREATININE 0.86 0.72  CALCIUM 10.1 9.6   Recent Labs     12/04/18 1245  AST 17  ALT 16  ALKPHOS 77  BILITOT 0.4  PROT 7.6  ALBUMIN 4.4   Recent Labs    12/04/18 1245 12/13/18 1048  WBC 4.8 8.0  NEUTROABS  --  5.4  HGB 13.3 13.4  HCT 39.2 40.9  MCV 93.2 96.2  PLT 226.0 211   Lab Results  Component Value Date   TSH 1.35 09/04/2017   Lab Results  Component Value Date   HGBA1C 6.0 (H) 03/07/2018   Lab Results  Component Value Date   CHOL 183 09/06/2017   HDL 63 09/06/2017   LDLCALC 99 09/06/2017   LDLDIRECT 65.7 07/08/2010   TRIG 113 09/06/2017   CHOLHDL 2.9 09/06/2017    Significant Diagnostic Results in last 30 days:  No results found.  Assessment/Plan 1. Right upper quadrant abdominal pain Reports no fever.reports pain on RUQ with distension.Had constipation x 2 days ago but bowels moved yesterday and today.suspect possible constipation.Advised to get further evaluation in the ED as soon as possible.spoke with patient's care giver states will take patient via private care but aware to call 9-1-1 if symptoms worsen.    2. Abdominal distension Care giver reports RUQ distended compared to LUQ.Described as able to cover with the hand like covering a knee cap.Advised to get evaluated in the ED.  Family/ staff Communication: Reviewed plan of care with patient and care giver will take patient to ED for further evaluation of right upper abdominal pain and distention.  Labs/tests ordered: None   Next Appointment: as needed   Spent 12 minutes of non-face to face with patient   Sandrea Hughs, NP

## 2019-03-04 NOTE — ED Provider Notes (Signed)
[facility] EMERGENCY DEPARTMENT Provider Note   CSN: [id number] Arrival date & time: 03/04/19  1317     History Chief Complaint  Patient presents with   Diarrhea    Ariana [name] is a 86 y.o. female.  HPI Patient presents with abdominal pain.  Sent in by his PCP.  Has had diarrhea on and off for the last 4 days.  States pain is in the right abdomen.  States it felt as if something twisted.  Had had constipation for then followed by diarrhea.  Had been had MiraLAX and Linzess.  Patient's daughter states she has been dealing with abdominal pain for months.  Has been gassy at times.  States she presses on her abdomen and she will belch.  Somewhat decreased oral intake.  Reviewing records had a liver lesion a few months ago but and able to get MRI approved by insurance.  No fevers or chills.    Past Medical History:  Diagnosis Date   Abnormality of gait    Adenomatous polyp of colon 2002   18mm   Allergic rhinitis    Anxiety    Anxiety and depression    Chronic back pain    Dementia without behavioral disturbance ([place])    Depression    Diverticulosis of colon    Dry eye syndrome    Dysphagia    Dysthymic disorder    Fibromyalgia    GERD (gastroesophageal reflux disease)    H/O hiatal hernia    History of adverse drug reaction    History of cerebrovascular disease 09/24/2014   History of recurrent UTIs    Hypertension, benign    Irritable bowel syndrome    Low back pain syndrome    Memory loss    Mitral valve prolapse    Paroxysmal A-fib (HCC)    Peripheral neuropathy    "both feet and legs"   Physical deconditioning    Sjogren's syndrome (HCC)    Therapeutic opioid-induced constipation ([facility])    Thyroid nodule    Urinary incontinence     Patient Active Problem List   Diagnosis Date Noted   Diabetic neuropathy ([place]) 01/28/2019   Diabetes mellitus due to underlying condition, uncontrolled ([place]) 09/04/2017   History of  rectal bleeding 09/04/2017   A-fib ([place]) 04/23/2017   Diabetes mellitus type 2 in nonobese ([place]) 04/23/2017   Sjogren's disease ([place]) 04/03/2016   Primary osteoarthritis of both hands 04/03/2016   High risk medication use 04/03/2016   Senile dementia, with behavioral disturbance ([name]) 08/16/2015   Swelling 99991111   Diastolic dysfunction 0000000   Hypertonicity, bladder 07/08/2015   Arterial hypotension    Bradycardia 06/29/2015   Chronic lower back pain 06/29/2015   PAF (paroxysmal atrial fibrillation) ([place]) 04/18/2015   Dehydration 04/18/2015   Dementia without behavioral disturbance ([place]) 04/18/2015   TIA (transient ischemic attack) 12/17/2014   Chest pain 12/16/2014   Acute encephalopathy 12/04/2014   Fall    AKI (acute kidney injury) ([place]) 12/03/2014   History of cerebrovascular disease 09/24/2014   Falls frequently 07/28/2014   Infarction of parietal lobe (HCC)    Mild dementia ([place])    CVA (cerebral infarction) 07/26/2014   Atrial fibrillation with RVR ([place]) 07/03/2014   Constipation 04/15/2014   Mixed stress and urge urinary incontinence 11/03/2013   Therapeutic opioid induced constipation 11/03/2013   Hemorrhoid 11/03/2013   Low back pain associated with a spinal disorder other than radiculopathy or spinal stenosis 11/03/2013   Protein-calorie malnutrition, severe ([place])  07/22/2013   UTI (lower urinary tract infection) 07/20/2013   Prolonged QT interval 07/20/2013   Osteopenia 07/17/2013   Palpitations 06/30/2013   Hereditary and idiopathic peripheral neuropathy 05/22/2013   Abnormality of gait 12/05/2012   Headache(784.0) 09/05/2012   Multinodular thyroid 01/15/2012   Neck pain 01/15/2012   Hypercholesterolemia 07/08/2010   Tear film insufficiency 07/06/2009   SJOGREN'S SYNDROME 07/06/2009   Urge urinary incontinence 01/06/2009   COLONIC POLYPS, ADENOMATOUS, HX OF 04/15/2008   MITRAL VALVE PROLAPSE 11/05/2007    ANXIETY DEPRESSION 07/02/2007   Mononeuritis 07/02/2007   HYPERTENSION, BENIGN 07/02/2007   GERD 07/02/2007   Irritable bowel syndrome 07/02/2007   Fibromyalgia 07/02/2007    Past Surgical History:  Procedure Laterality Date   ABDOMINAL [place]  02/17/2003   normal L main, LAD free of disease, Cfx free of disease, RCA free of disease (Dr. Loni Muse. Little)   CATARACT EXTRACTION, BILATERAL     CHOLECYSTECTOMY  2000   COLONOSCOPY W/ BIOPSIES     multiple   DENTAL SURGERY     multiple tooth extractions   ESOPHAGOGASTRODUODENOSCOPY (EGD) WITH ESOPHAGEAL DILATION N/A 08/23/2012   Procedure: ESOPHAGOGASTRODUODENOSCOPY (EGD) WITH ESOPHAGEAL DILATION;  Surgeon: Milus Banister, MD;  Location: [facility] END[facility];  Service: Endoscopy;  Laterality: N/A;   NASAL SEPTUM SURGERY  1980   NM MYOCAR PERF WALL MOTION  2003   persantine - normal static and dynamic study w/apical thinning and presvered LV function, no ischemia   SINUS EXPLORATION     ossifiying fibroma   TEMPOROMANDIBULAR JOINT SURGERY  1986   Dr. Terence Lux   TRANSTHORACIC ECHOCARDIOGRAM  2001   mild LVH, normal LV     OB History   No obstetric history on file.     Family History  Problem Relation Age of Onset   Heart disease Father        heart attack   Pneumonia Father    Heart attack Mother    Hypertension Mother    Colon cancer Sister    Kidney disease Daughter    Asthma Daughter    Arthritis Daughter 19       osteo,   Heart disease Son 61       stage 3 CHF(Diastolic /Systolic)   Throat cancer Brother    Hypertension Maternal Grandmother     Social History   Tobacco Use   Smoking status: Former Smoker   Smokeless tobacco: Never Used   Tobacco comment: Quit at age 49   Substance Use Topics   Alcohol use: No   Drug use: Never    Home Medications Prior to Admission medications   Medication Sig Start Date End Date Taking?  Authorizing Provider  acetaminophen (TYLENOL) 500 MG tablet Take 1,000 mg by mouth 2 (two) times daily.    [provider]  ALPRAZolam Duanne Moron) 0.25 MG tablet Take 1 tablet (0.25 mg total) by mouth 2 (two) times daily as needed. 02/24/19   Reed, Tiffany L, DO  antiseptic oral rinse (BIOTENE) LIQD 15 mLs by Mouth Rinse route 5 (five) times daily as needed for dry mouth.    [provider]  aspirin 325 MG tablet Take 1 tablet (325 mg total) by mouth daily. 07/28/14   Reyne Dumas, MD  Capsaicin-Menthol-Methyl Sal (CAPSAICIN-METHYL SAL-MENTHOL) 0.025-1-12 % CREA Apply 1 application topically 2 (two) times daily. 09/11/18   Ngetich, Dinah C, NP  carboxymethylcellulose (REFRESH TEARS) 0.5 % SOLN Place 1 drop into  both eyes 5 (five) times daily as needed (dry eyes).     [provider]  cephALEXin (KEFLEX) 500 MG capsule Take 1 capsule (500 mg total) by mouth 3 (three) times daily. 03/04/19   Davonna Belling, MD  CRANBERRY PO Take 2 tablets by mouth daily.    [provider]  diltiazem (CARTIA XT) 180 MG 24 hr capsule Take 1 capsule (180 mg total) by mouth daily. 03/20/18   Hilty, Nadean Corwin, MD  DULoxetine (CYMBALTA) 30 MG capsule Take one capsule by mouth once daily for back pain and for depression. 02/03/19   Reed, Tiffany L, DO  DULoxetine (CYMBALTA) 60 MG capsule Take 1 capsule (60 mg total) by mouth daily. 02/03/19   Reed, Tiffany L, DO  furosemide (LASIX) 40 MG tablet Take 1 tablet (40 mg total) by mouth daily. 02/03/19   Reed, Tiffany L, DO  hydrocortisone (ANUSOL-HC) 2.5 % rectal cream Place 1 application rectally daily. Use for 10 days 12/04/18   Willia Craze, NP  lansoprazole (PREVACID) 30 MG capsule TAKE 1 CAPSULE BY MOUTH ONCE DAILY AT NOON 12/06/18   Reed, Tiffany L, DO  linaclotide (LINZESS) 290 MCG CAPS capsule TAKE 1 CAPSULE BY MOUTH EVERY DAY AT BEDTIME 02/13/19   Gatha Mayer, MD  memantine (NAMENDA) 10 MG tablet Take 1 tablet (10 mg total) by mouth  2 (two) times daily. 02/24/19   Reed, Tiffany L, DO  mupirocin ointment (BACTROBAN) 2 % Place 1 application into the nose 2 (two) times daily. Right nostril until soreness resolves 02/20/19   Reed, Tiffany L, DO  MYRBETRIQ 50 MG TB24 tablet Take 1 tablet by mouth once daily 01/13/19   Reed, Tiffany L, DO  nystatin (MYCOSTATIN) 100000 UNIT/ML suspension Take 5 mLs (500,000 Units total) by mouth 4 (four) times daily. Until soreness of mouth resolves 02/20/19   Reed, Tiffany L, DO  ONE TOUCH LANCETS MISC Use to test blood sugar twice daily.  Dx: E11.9 05/15/17   Reed, Tiffany L, DO  ONETOUCH VERIO test strip USE ONE STRIP TO CHECK GLUCOSE TWICE DAILY AS DIRECTED 06/03/18   Reed, Tiffany L, DO  pilocarpine (SALAGEN) 5 MG tablet Take 1 tablet (5 mg total) by mouth 2 (two) times daily. 02/03/19   Bo Merino, MD  polyethylene glycol (MIRALAX / GLYCOLAX) packet Take by mouth daily as needed (constipation). Mix 1/2 capful of miralax in 8 oz liquid and drink    [provider]  potassium chloride SA (K-DUR) 20 MEQ tablet Take 1 tablet by mouth once daily 08/01/18   Reed, Tiffany L, DO  pregabalin (LYRICA) 100 MG capsule Take 1 capsule by mouth twice daily 03/04/19   Reed, Tiffany L, DO  sitaGLIPtin (JANUVIA) 100 MG tablet Take 1 tablet (100 mg total) by mouth daily. 11/08/18   Ngetich, Dinah C, NP  topiramate (TOPAMAX) 25 MG tablet Take 1 tablet (25 mg total) by mouth daily. 11/25/18   Ngetich, Dinah C, NP    Allergies    Banana, Codeine, Klonopin [clonazepam], Meperidine hcl, Norflex [orphenadrine citrate], Oxycodone-acetaminophen, Propoxyphene hcl, Zoloft [sertraline hcl], Doxycycline, Naproxen, Penicillins, Phenothiazines, Stelazine, Sulfamethoxazole-trimethoprim, Tolectin [tolmetin sodium], and Tramadol  Review of Systems   Review of Systems  Constitutional: Positive for appetite change.  HENT: Negative for congestion.   Respiratory: Negative for shortness of breath.   Cardiovascular: Negative  for chest pain.  Gastrointestinal: Positive for abdominal pain, constipation, diarrhea and nausea.  Genitourinary: Negative for flank pain.  Musculoskeletal: Negative for back pain.  Skin: Negative for rash.  Neurological: Negative for weakness.  Psychiatric/Behavioral: Negative for confusion.    Physical Exam Updated Vital Signs BP 115/69 (BP Location: Right Arm)    Pulse 72    Temp 98.2 F (36.8 C) (Oral)    Resp 19    Ht 5\' 3"  (1.6 m)    Wt 69.4 kg    SpO2 97%    BMI 27.10 kg/m   Physical Exam Vitals and nursing note reviewed.  HENT:     Head: Atraumatic.  Eyes:     Extraocular Movements: Extraocular movements intact.  Cardiovascular:     Rate and Rhythm: Normal rate.  Pulmonary:     Breath sounds: No wheezing or rhonchi.  Abdominal:     Tenderness: There is abdominal tenderness.     Comments: Mild right-sided abdominal tenderness.  Some fullness.  No rebound or guarding.  No hernia palpated.  Has midline scar from previous "exploration".  Musculoskeletal:        General: No tenderness.  Skin:    General: Skin is warm.     Capillary Refill: Capillary refill takes less than 2 seconds.     Coloration: Skin is not jaundiced.  Neurological:     Mental Status: She is alert. Mental status is at baseline.     ED Results / Procedures / Treatments   Labs (all labs ordered are listed, but only abnormal results are displayed) Labs Reviewed  COMPREHENSIVE METABOLIC PANEL - Abnormal; Notable for the following components:      Result Value   Glucose, Bld 118 (*)    BUN 25 (*)    All other components within normal limits  URINALYSIS, ROUTINE W REFLEX MICROSCOPIC - Abnormal; Notable for the following components:   APPearance CLOUDY (*)    Nitrite POSITIVE (*)    Leukocytes,Ua SMALL (*)    All other components within normal limits  URINALYSIS, MICROSCOPIC (REFLEX) - Abnormal; Notable for the following components:   Bacteria, UA MANY (*)    All other components within normal  limits  URINE CULTURE  CBC WITH DIFFERENTIAL/PLATELET  LIPASE, BLOOD    EKG None  Radiology CT ABDOMEN PELVIS W CONTRAST  Result Date: 03/04/2019 CLINICAL DATA:  Mid abdominal pain and swelling. Sjogren's syndrome. Bowel obstruction suspected. EXAM: CT ABDOMEN AND PELVIS WITH CONTRAST TECHNIQUE: Multidetector CT imaging of the abdomen and pelvis was performed using the standard protocol following bolus administration of intravenous contrast. CONTRAST:  17mL OMNIPAQUE IOHEXOL 300 MG/ML  SOLN COMPARISON:  10/29/2018 FINDINGS: Lower chest: Clear lung bases. Normal heart size without pericardial or pleural effusion. Hepatobiliary: Inferior right hepatic lobe heterogeneously enhancing 3.2 cm lesion on 49/2 may have enlarged from 2.7 cm on the prior. Hepatomegaly versus a Riedel's lobe at 20.5 cm craniocaudal. Cholecystectomy with upper normal intrahepatic and extrahepatic ductal size. Common duct measures on the order of 9 mm on 29/2 and is not significantly changed. Pancreas: Pancreatic atrophy, most significant the body and head. Spleen: Normal in size, without focal abnormality. Adrenals/Urinary Tract: Normal adrenal glands. Normal right kidney. Left renal too small to characterize lesions. Previously described bladder wall thickening has resolved. There is air within the nondependent bladder including on 71/2. Stomach/Bowel: Proximal gastric underdistention. Scattered colonic diverticula. Normal terminal ileum. Normal small bowel. Vascular/Lymphatic: Aortic atherosclerosis. No abdominopelvic adenopathy. Reproductive: Hysterectomy.  No adnexal mass. Other: No significant free fluid. Mild pelvic floor laxity. Tiny fat containing left inguinal hernia. Musculoskeletal: Osteopenia. Degenerate disc disease at the lumbosacral junction. IMPRESSION: 1.  No acute process in the abdomen or pelvis. 2. Right hepatic lobe lesion, possibly enlarged compared to 10/29/2018. When the patient is clinically stable and able  to follow directions and hold their breath (preferably as an outpatient) further evaluation with dedicated abdominal MRI should be considered. 3. Gas within the urinary bladder could be iatrogenic. Correlate with instrumentation. Electronically Signed   By: Abigail Miyamoto M.D.   On: 03/04/2019 17:02    Procedures Procedures (including critical care time)  Medications Ordered in ED Medications  iohexol (OMNIPAQUE) 300 MG/ML solution 100 mL (100 mLs Intravenous Contrast Given 03/04/19 1637)    ED Course  I have reviewed the triage vital signs and the nursing notes.  Pertinent labs & imaging results that were available during my care of the patient were reviewed by me and considered in my medical decision making (see chart for details).    MDM Rules/Calculators/A&P                      Patient with abdominal pain.  Apparently is been going on for months.  Also diarrhea at times and constipation at times.  Some bloating.  CT scan done and overall reassuring although liver mass has enlarged.  Will need outpatient MRI.  PCP has been trying to get this done but it sounds like there are insurance issues.  May have UTI.  Reportedly has had some burning with urination.  Reviewed records and had UTI couple months ago.  Subsequent E. coli treated with Keflex that time.  Will treat Keflex again.  Will discharge home.  Culture sent to follow-up PCP or urology Final Clinical Impression(s) / ED Diagnoses Final diagnoses:  Urinary tract infection without hematuria, site unspecified  Lower abdominal pain  Liver mass    Rx / DC Orders ED Discharge Orders         Ordered    cephALEXin (KEFLEX) 500 MG capsule  3 times daily     03/04/19 1818           Davonna Belling, MD 03/04/19 [facility]elle [facility]

## 2019-03-04 NOTE — Discharge Instructions (Signed)
Follow-up with [name] for further evaluation of the liver mass.  Also she can follow the urine culture, as can your urologist.

## 2019-03-04 NOTE — ED Notes (Signed)
ED Provider at bedside. 

## 2019-03-04 NOTE — Patient Instructions (Addendum)
Please go to ED as soon as possible for further evaluation of right upper abdominal pain and distention.Call 9-1-1 if symptoms worsen.

## 2019-03-04 NOTE — ED Notes (Signed)
Lab called to add on urine culture.  ?

## 2019-03-04 NOTE — ED Triage Notes (Signed)
Pt arrives with caregiver, Amy POV, sent by PCP for diarrhea that has been "off and on" since Friday. Caregiver also reports that the pt has increased bloating.

## 2019-03-05 ENCOUNTER — Other Ambulatory Visit: Payer: Self-pay | Admitting: Internal Medicine

## 2019-03-05 DIAGNOSIS — R140 Abdominal distension (gaseous): Secondary | ICD-10-CM

## 2019-03-05 DIAGNOSIS — R1011 Right upper quadrant pain: Secondary | ICD-10-CM

## 2019-03-05 DIAGNOSIS — K769 Liver disease, unspecified: Secondary | ICD-10-CM

## 2019-03-06 ENCOUNTER — Other Ambulatory Visit: Payer: Self-pay

## 2019-03-06 ENCOUNTER — Ambulatory Visit (INDEPENDENT_AMBULATORY_CARE_PROVIDER_SITE_OTHER): Payer: Medicare Other | Admitting: Internal Medicine

## 2019-03-06 ENCOUNTER — Encounter: Payer: Self-pay | Admitting: Internal Medicine

## 2019-03-06 VITALS — BP 118/72 | HR 87 | Temp 97.5°F | Ht 63.0 in | Wt 153.0 lb

## 2019-03-06 DIAGNOSIS — K582 Mixed irritable bowel syndrome: Secondary | ICD-10-CM | POA: Diagnosis not present

## 2019-03-06 DIAGNOSIS — F0150 Vascular dementia without behavioral disturbance: Secondary | ICD-10-CM

## 2019-03-06 DIAGNOSIS — R160 Hepatomegaly, not elsewhere classified: Secondary | ICD-10-CM | POA: Diagnosis not present

## 2019-03-06 DIAGNOSIS — N390 Urinary tract infection, site not specified: Secondary | ICD-10-CM

## 2019-03-06 DIAGNOSIS — B9620 Unspecified Escherichia coli [E. coli] as the cause of diseases classified elsewhere: Secondary | ICD-10-CM

## 2019-03-06 LAB — URINE CULTURE: Culture: >=100000 — AB

## 2019-03-06 NOTE — Progress Notes (Signed)
Location:   [facility]   Place of Service:   [facility] Provider: Dr. Hollace Kinnier   Goals of Care:  Advanced Directives 03/06/2019  Does Patient Have a Medical Advance Directive? Yes  Type of Advance Directive [place]  Does patient want to make changes to medical advance directive? No - Patient declined  Copy of [place] in Chart? -  Would patient like information on creating a medical advance directive? -  Pre-existing out of facility DNR order (yellow form or pink MOST form) -     Chief Complaint  Patient presents with  . Acute Visit     Follow up from ED 03/04/19 for UTI. back pain     HPI: Patient is a 86 y.o. female seen today for follow up for emergency visit on 03/04/2019.   Caregiver, Smith Potenza present for visit.   03/04/2019 she was having abdominal pain and diarrhea for 4 days,urinary frequency and confusion. NP with [facility] advised them to seek care from the ED. She went to [facility] ED. While there, she underwent lab workup and CT of abdomen. Labs were unremarkable except for possible E.coli in U/A, culture pending. CT of abdomen showed possible liver mass or lesion. Follow up MRI of abdomen recommended. Caregiver states appointment with [facility] already made for 03/31/2019. She was discharged home the same day.    Since her visit to the ED, her urinary frequency and confusion has decreased. She is taking Keflex three times a day. Caregiver is also encouraging oral fluids 6-8 glasses of water a day. . She is also taking cranberry pills and juice. States her urine is light yellow with a odor. Denies any fevers. No diarrhea since ED visit. Abdominal pain has subsided, but she states she feels "bloated."  Past Medical History:  Diagnosis Date  . Abnormality of gait   . Adenomatous polyp of colon 2002   71mm  . Allergic rhinitis   . Anxiety   . Anxiety and depression   . Chronic back pain   . Dementia without behavioral  disturbance ([place])   . Depression   . Diverticulosis of colon   . Dry eye syndrome   . Dysphagia   . Dysthymic disorder   . Fibromyalgia   . GERD (gastroesophageal reflux disease)   . H/O hiatal hernia   . History of adverse drug reaction   . History of cerebrovascular disease 09/24/2014  . History of recurrent UTIs   . Hypertension, benign   . Irritable bowel syndrome   . Low back pain syndrome   . Memory loss   . Mitral valve prolapse   . Paroxysmal A-fib ([place])   . Peripheral neuropathy    "both feet and legs"  . Physical deconditioning   . Sjogren's syndrome ([place])   . Therapeutic opioid-induced constipation (OIC)   . Thyroid nodule   . Urinary incontinence     Past Surgical History:  Procedure Laterality Date  . ABDOMINAL HYSTERECTOMY  1967  . APPENDECTOMY    . CARDIAC CATHETERIZATION  02/17/2003   normal L main, LAD free of disease, Cfx free of disease, RCA free of disease (Dr. Loni Muse. [facility])  . CATARACT EXTRACTION, BILATERAL    . CHOLECYSTECTOMY  2000  . COLONOSCOPY W/ BIOPSIES     multiple  . DENTAL SURGERY     multiple tooth extractions  . ESOPHAGOGASTRODUODENOSCOPY (EGD) WITH ESOPHAGEAL DILATION N/A 08/23/2012   Procedure: ESOPHAGOGASTRODUODENOSCOPY (EGD) WITH ESOPHAGEAL DILATION;  Surgeon: Melene Plan  Ardis Hughs, MD;  Location: [facility];  Service: Endoscopy;  Laterality: N/A;  . NASAL SEPTUM SURGERY  1980  . NM MYOCAR PERF WALL MOTION  2003   persantine - normal static and dynamic study w/apical thinning and presvered LV function, no ischemia  . SINUS EXPLORATION     ossifiying fibroma  . TEMPOROMANDIBULAR JOINT SURGERY  1986   Dr. Terence Lux  . TRANSTHORACIC ECHOCARDIOGRAM  2001   mild LVH, normal LV    Allergies  Allergen Reactions  . Banana Nausea And Vomiting  . Codeine Nausea Only    unless given with Phenergan  . Klonopin [Clonazepam] Other (See Comments)    Causes hallucination   . Meperidine Hcl Nausea Only    unless given with Phenergan  . Norflex  [Orphenadrine Citrate] Nausea Only    Unless given with Phenergan  . Oxycodone-Acetaminophen Nausea Only    unless given with phenergan  . Propoxyphene Hcl Nausea Only    unless given with phenergan  . Zoloft [Sertraline Hcl] Other (See Comments)    Caused lethargy  . Doxycycline Other (See Comments)    Unknown reaction  . Naproxen Other (See Comments)    Unknown reaction  . Penicillins Other (See Comments)    Unknown reaction Has patient had a PCN reaction causing immediate rash, facial/tongue/throat swelling, SOB or lightheadedness with hypotension: Unknown Has patient had a PCN reaction causing severe rash involving mucus membranes or skin necrosis: Unknown Has patient had a PCN reaction that required hospitalization: pt was in the hospital at time of reaction Has patient had a PCN reaction occurring within the last 10 years: Unknown If all of the above answers are "NO", then may proceed with Cephalos  . Phenothiazines Other (See Comments)    Unknown reaction  . Stelazine Other (See Comments)    Unknown reaction  . Sulfamethoxazole-Trimethoprim Other (See Comments)    Unknown reaction  . Tolectin [Tolmetin Sodium] Other (See Comments)    Unknown reaction  . Tramadol Other (See Comments)    Unknown reaction    Outpatient Encounter Medications as of 03/06/2019  Medication Sig  . acetaminophen (TYLENOL) 500 MG tablet Take 1,000 mg by mouth 2 (two) times daily.  [place] Kitchen ALPRAZolam (XANAX) 0.25 MG tablet Take 1 tablet (0.25 mg total) by mouth 2 (two) times daily as needed.  [place] Kitchen antiseptic oral rinse (BIOTENE) LIQD 15 mLs by Mouth Rinse route 5 (five) times daily as needed for dry mouth.  [place] Kitchen aspirin 325 MG tablet Take 1 tablet (325 mg total) by mouth daily.  . Capsaicin-Menthol-Methyl Sal (CAPSAICIN-METHYL SAL-MENTHOL) 0.025-1-12 % CREA Apply 1 application topically 2 (two) times daily.  . carboxymethylcellulose (REFRESH TEARS) 0.5 % SOLN Place 1 drop into both eyes 5 (five) times daily as  needed (dry eyes).   . cephALEXin (KEFLEX) 500 MG capsule Take 1 capsule (500 mg total) by mouth 3 (three) times daily.  [place] Kitchen CRANBERRY PO Take 2 tablets by mouth daily.  [place] Kitchen diltiazem (CARTIA XT) 180 MG 24 hr capsule Take 1 capsule (180 mg total) by mouth daily.  . DULoxetine (CYMBALTA) 30 MG capsule Take one capsule by mouth once daily for back pain and for depression.  . DULoxetine (CYMBALTA) 60 MG capsule Take 1 capsule (60 mg total) by mouth daily.  . furosemide (LASIX) 40 MG tablet Take 1 tablet (40 mg total) by mouth daily.  . hydrocortisone (ANUSOL-HC) 2.5 % rectal cream Place 1 application rectally daily. Use for 10 days  . lansoprazole (PREVACID) 30 MG capsule  TAKE 1 CAPSULE BY MOUTH ONCE DAILY AT NOON  . linaclotide (LINZESS) 290 MCG CAPS capsule TAKE 1 CAPSULE BY MOUTH EVERY DAY AT BEDTIME  . memantine (NAMENDA) 10 MG tablet Take 1 tablet (10 mg total) by mouth 2 (two) times daily.  . mupirocin ointment (BACTROBAN) 2 % Place 1 application into the nose 2 (two) times daily. Right nostril until soreness resolves  . MYRBETRIQ 50 MG TB24 tablet Take 1 tablet by mouth once daily  . nystatin (MYCOSTATIN) 100000 UNIT/ML suspension Take 5 mLs (500,000 Units total) by mouth 4 (four) times daily. Until soreness of mouth resolves  . ONE TOUCH LANCETS MISC Use to test blood sugar twice daily.  Dx: E11.9  . ONETOUCH VERIO test strip USE ONE STRIP TO CHECK GLUCOSE TWICE DAILY AS DIRECTED  . pilocarpine (SALAGEN) 5 MG tablet Take 1 tablet (5 mg total) by mouth 2 (two) times daily.  . polyethylene glycol (MIRALAX / GLYCOLAX) packet Take by mouth daily as needed (constipation). Mix 1/2 capful of miralax in 8 oz liquid and drink  . potassium chloride SA (K-DUR) 20 MEQ tablet Take 1 tablet by mouth once daily  . pregabalin (LYRICA) 100 MG capsule Take 1 capsule by mouth twice daily  . sitaGLIPtin (JANUVIA) 100 MG tablet Take 1 tablet (100 mg total) by mouth daily.  . [DISCONTINUED] topiramate (TOPAMAX) 25  MG tablet Take 1 tablet (25 mg total) by mouth daily.   No facility-administered encounter medications on file as of 03/06/2019.    Review of Systems:  Review of Systems  Constitutional: Negative for activity change, appetite change and fever.  Respiratory: Negative for cough and shortness of breath.   Cardiovascular: Negative for chest pain.  Gastrointestinal: Negative for abdominal pain, constipation, diarrhea and nausea.  Genitourinary: Positive for dysuria and frequency. Negative for difficulty urinating and hematuria.  Psychiatric/Behavioral: Positive for confusion. Negative for dysphoric mood and sleep disturbance. The patient is not nervous/anxious.   All other systems reviewed and are negative.   Health Maintenance  Topic Date Due  . OPHTHALMOLOGY EXAM  07/04/1939  . HEMOGLOBIN A1C  09/05/2018  . MAMMOGRAM  09/12/2019  . FOOT EXAM  01/28/2020  . TETANUS/TDAP  12/09/2023  . INFLUENZA VACCINE  Completed  . DEXA SCAN  Completed  . PNA vac Low Risk Adult  Completed  . URINE MICROALBUMIN  Discontinued    Physical Exam: Vitals:   03/06/19 1434  BP: 118/72  Pulse: 87  Temp: (!) 97.5 F (36.4 C)  TempSrc: Temporal  SpO2: 97%  Weight: 153 lb (69.4 kg)  Height: 5\' 3"  (1.6 m)   Body mass index is 27.1 kg/m. Physical Exam Vitals reviewed.  Constitutional:      General: She is not in acute distress.    Appearance: Normal appearance. She is normal weight.  Cardiovascular:     Rate and Rhythm: Normal rate and regular rhythm.     Pulses: Normal pulses.     Heart sounds: Normal heart sounds. No murmur.  Pulmonary:     Effort: Pulmonary effort is normal. No respiratory distress.     Breath sounds: Normal breath sounds. No wheezing.  Abdominal:     General: Bowel sounds are normal. There is no distension.     Palpations: Abdomen is soft.     Tenderness: There is no abdominal tenderness.     Comments: Distension and tenderness over RUL and RLL. No sign of hernia.     Musculoskeletal:     Right lower leg:  No edema.     Left lower leg: No edema.  Skin:    General: Skin is warm and dry.     Capillary Refill: Capillary refill takes less than 2 seconds.  Neurological:     General: No focal deficit present.     Mental Status: She is alert and oriented to person, place, and time. Mental status is at baseline.  Psychiatric:        Attention and Perception: Attention normal.        Mood and Affect: Mood normal.        Behavior: Behavior normal.        Thought Content: Thought content normal.        Cognition and Memory: Memory is impaired.        Judgment: Judgment normal.     Labs reviewed: Basic Metabolic Panel: Recent Labs    12/04/18 1245 12/13/18 1048 03/04/19 1505  NA 137 140 138  K 3.9 3.8 4.1  CL 100 105 102  CO2 30 27 26   GLUCOSE 107* 120* 118*  BUN 23 27* 25*  CREATININE 0.86 0.72 0.78  CALCIUM 10.1 9.6 9.9   Liver Function Tests: Recent Labs    12/04/18 1245 03/04/19 1505  AST 17 24  ALT 16 22  ALKPHOS 77 89  BILITOT 0.4 0.7  PROT 7.6 8.1  ALBUMIN 4.4 4.5   Recent Labs    03/04/19 1505  LIPASE 32   No results for input(s): AMMONIA in the last 8760 hours. CBC: Recent Labs    12/04/18 1245 12/13/18 1048 03/04/19 1505  WBC 4.8 8.0 7.0  NEUTROABS  --  5.4 4.0  HGB 13.3 13.4 13.7  HCT 39.2 40.9 42.2  MCV 93.2 96.2 96.1  PLT 226.0 211 207   Lipid Panel: No results for input(s): CHOL, HDL, LDLCALC, TRIG, CHOLHDL, LDLDIRECT in the last 8760 hours. Lab Results  Component Value Date   HGBA1C 6.0 (H) 03/07/2018    Procedures since last visit: CT ABDOMEN PELVIS W CONTRAST  Result Date: 03/04/2019 CLINICAL DATA:  Mid abdominal pain and swelling. Sjogren's syndrome. Bowel obstruction suspected. EXAM: CT ABDOMEN AND PELVIS WITH CONTRAST TECHNIQUE: Multidetector CT imaging of the abdomen and pelvis was performed using the standard protocol following bolus administration of intravenous contrast. CONTRAST:  176mL  OMNIPAQUE IOHEXOL 300 MG/ML  SOLN COMPARISON:  10/29/2018 FINDINGS: Lower chest: Clear lung bases. Normal heart size without pericardial or pleural effusion. Hepatobiliary: Inferior right hepatic lobe heterogeneously enhancing 3.2 cm lesion on 49/2 may have enlarged from 2.7 cm on the prior. Hepatomegaly versus a Riedel's lobe at 20.5 cm craniocaudal. Cholecystectomy with upper normal intrahepatic and extrahepatic ductal size. Common duct measures on the order of 9 mm on 29/2 and is not significantly changed. Pancreas: Pancreatic atrophy, most significant the body and head. Spleen: Normal in size, without focal abnormality. Adrenals/Urinary Tract: Normal adrenal glands. Normal right kidney. Left renal too small to characterize lesions. Previously described bladder wall thickening has resolved. There is air within the nondependent bladder including on 71/2. Stomach/Bowel: Proximal gastric underdistention. Scattered colonic diverticula. Normal terminal ileum. Normal small bowel. Vascular/Lymphatic: Aortic atherosclerosis. No abdominopelvic adenopathy. Reproductive: Hysterectomy.  No adnexal mass. Other: No significant free fluid. Mild pelvic floor laxity. Tiny fat containing left inguinal hernia. Musculoskeletal: Osteopenia. Degenerate disc disease at the lumbosacral junction. IMPRESSION: 1.  No acute process in the abdomen or pelvis. 2. Right hepatic lobe lesion, possibly enlarged compared to 10/29/2018. When the patient is clinically stable and able  to follow directions and hold their breath (preferably as an outpatient) further evaluation with dedicated abdominal MRI should be considered. 3. Gas within the urinary bladder could be iatrogenic. Correlate with instrumentation. Electronically Signed   By: Abigail Miyamoto M.D.   On: 03/04/2019 17:02    Assessment/Plan 1. E. coli UTI - patient reports improved urinary frequency and lighter urine - urine culture still pending - continue Keflex antibiotic until  complete - encourage hydration, 6-8 glasses of water daily - continue cranberry juice and supplement daily  2. Liver mass - right lobe hepatic lesion detected with possible hepatomegaly, patient with ongoing abdominal pain and distension, MRI of abdomen recommended - complete MRI of abdomen as scheduled in February  3. Vascular dementia without behavioral disturbance ([place]) - ongoing - will still scramble thoughts and stories  - continue to use cane when ambulating    Labs/tests ordered:none Next appt:  1 month follow up

## 2019-03-07 ENCOUNTER — Telehealth: Payer: Self-pay | Admitting: *Deleted

## 2019-03-07 ENCOUNTER — Other Ambulatory Visit: Payer: Self-pay | Admitting: *Deleted

## 2019-03-07 DIAGNOSIS — K2100 Gastro-esophageal reflux disease with esophagitis, without bleeding: Secondary | ICD-10-CM

## 2019-03-07 MED ORDER — LANSOPRAZOLE 30 MG PO CPDR: DELAYED_RELEASE_CAPSULE | ORAL | 1 refills | Status: DC

## 2019-03-07 NOTE — Telephone Encounter (Signed)
Patient daughter, Minh requested refill.  Faxed to pharmacy.

## 2019-03-07 NOTE — Telephone Encounter (Signed)
Post ED Visit - Positive Culture Follow-up  Culture report reviewed by antimicrobial stewardship pharmacist: [place] Team []  [facility], Pharm.D. []  Heide Guile, Pharm.D., BCPS AQ-ID []  Parks Neptune, Pharm.D., BCPS []  Alycia Rossetti, Pharm.D., BCPS []  [employer], Pharm.D., BCPS, AAHIVP []  Legrand Como, Pharm.D., BCPS, AAHIVP [x]  Salome Arnt, PharmD, BCPS []  Johnnette Gourd, PharmD, BCPS []  Hughes Better, PharmD, BCPS []  Leeroy Cha, PharmD []  Laqueta Linden, PharmD, BCPS []  Albertina Parr, PharmD  [place] Team []  Leodis Sias, PharmD []  Lindell Spar, PharmD []  Royetta Asal, PharmD []  Graylin Shiver, Rph []  Rema Fendt) Glennon Mac, PharmD []  Arlyn Dunning, PharmD []  Netta Cedars, PharmD []  Dia Sitter, PharmD []  Leone Haven, PharmD []  Gretta Arab, PharmD []  Theodis Shove, PharmD []  Peggyann Juba, PharmD []  Reuel Boom, PharmD   Positive urine culture Treated with Cephalexin, organism sensitive to the same and no further patient follow-up is required at this time.  Harlon Flor [facility] 03/07/2019, 10:48 AM

## 2019-03-14 NOTE — Progress Notes (Signed)
Office Visit Note  Patient: Ariana [name]             Date of Birth: 1929/05/29           MRN: [id number]             PCP: Gayland Curry, DO Referring: Gayland Curry, DO Visit Date: 03/19/2019 Occupation: @GUAROCC @  Subjective:  Sicca symptoms   History of Present Illness: Ariana [name] is a 86 y.o. female with history of Sjogren's, osteoarthritis, and DDD.  She continues to have chronic sicca symptoms.  She uses Biotene mouth rinse and refresh eyedrops as needed for symptomatic relief.  She takes pilocarpine 5 mg 1 tablet twice daily. She continues to have chronic pain in both hands and both knee joints.  She has stiffness in her knee joints after sitting for prolonged period of time.  She uses a cane to assist with ambulation.  She states she has had 2 recent falls while at home.    Activities of Daily Living:  Patient reports joint stiffness all day  Patient Reports nocturnal pain.  Difficulty dressing/grooming: Reports Difficulty climbing stairs: Reports Difficulty getting out of chair: Reports Difficulty using hands for taps, buttons, cutlery, and/or writing: Reports  Review of Systems  Constitutional: Positive for fatigue.  HENT: Positive for mouth dryness and nose dryness. Negative for mouth sores.   Eyes: Positive for dryness. Negative for pain and visual disturbance.  Respiratory: Negative for cough, hemoptysis, shortness of breath and difficulty breathing.   Cardiovascular: Negative for chest pain, palpitations, hypertension and swelling in legs/feet.  Gastrointestinal: Positive for constipation. Negative for blood in stool and diarrhea.  Endocrine: Negative for heat intolerance and increased urination.  Musculoskeletal: Positive for arthralgias, joint pain and morning stiffness. Negative for joint swelling, myalgias, muscle weakness, muscle tenderness and myalgias.  Skin: Negative for color change, pallor, rash, hair loss, nodules/bumps, skin tightness, ulcers and  sensitivity to sunlight.  Allergic/Immunologic: Negative for susceptible to infections.  Neurological: Negative for dizziness and numbness.  Hematological: Positive for bruising/bleeding tendency. Negative for swollen glands.  Psychiatric/Behavioral: Negative for depressed mood and sleep disturbance. The patient is not nervous/anxious.     PMFS History:  Patient Active Problem List   Diagnosis Date Noted  . Diabetic neuropathy ([place]) 01/28/2019  . Diabetes mellitus due to underlying condition, uncontrolled ([place]) 09/04/2017  . History of rectal bleeding 09/04/2017  . A-fib ([place]) 04/23/2017  . Diabetes mellitus type 2 in nonobese ([place]) 04/23/2017  . Sjogren's disease ([place]) 04/03/2016  . Primary osteoarthritis of both hands 04/03/2016  . High risk medication use 04/03/2016  . Senile dementia, with behavioral disturbance ([place]) 08/16/2015  . Swelling 08/16/2015  . Diastolic dysfunction 0000000  . Hypertonicity, bladder 07/08/2015  . Arterial hypotension   . Bradycardia 06/29/2015  . Chronic lower back pain 06/29/2015  . PAF (paroxysmal atrial fibrillation) ([place]) 04/18/2015  . Dehydration 04/18/2015  . Dementia without behavioral disturbance ([place]) 04/18/2015  . TIA (transient ischemic attack) 12/17/2014  . Chest pain 12/16/2014  . Acute encephalopathy 12/04/2014  . Fall   . AKI (acute kidney injury) ([place]) 12/03/2014  . History of cerebrovascular disease 09/24/2014  . Falls frequently 07/28/2014  . Infarction of parietal lobe ([place])   . Mild dementia ([place])   . CVA (cerebral infarction) 07/26/2014  . Atrial fibrillation with RVR ([place]) 07/03/2014  . Constipation 04/15/2014  . Mixed stress and urge urinary incontinence 11/03/2013  . Therapeutic opioid induced constipation 11/03/2013  . Hemorrhoid 11/03/2013  .  Low back pain associated with a spinal disorder other than radiculopathy or spinal stenosis 11/03/2013  . Protein-calorie malnutrition, severe ([place]) 07/22/2013  . UTI (lower  urinary tract infection) 07/20/2013  . Prolonged QT interval 07/20/2013  . Osteopenia 07/17/2013  . Palpitations 06/30/2013  . Hereditary and idiopathic peripheral neuropathy 05/22/2013  . Abnormality of gait 12/05/2012  . Headache(784.0) 09/05/2012  . Multinodular thyroid 01/15/2012  . Neck pain 01/15/2012  . Hypercholesterolemia 07/08/2010  . Tear film insufficiency 07/06/2009  . [place] SYNDROME 07/06/2009  . Urge urinary incontinence 01/06/2009  . COLONIC POLYPS, ADENOMATOUS, HX OF 04/15/2008  . MITRAL VALVE PROLAPSE 11/05/2007  . ANXIETY DEPRESSION 07/02/2007  . Mononeuritis 07/02/2007  . HYPERTENSION, BENIGN 07/02/2007  . GERD 07/02/2007  . Irritable bowel syndrome 07/02/2007  . Fibromyalgia 07/02/2007    Past Medical History:  Diagnosis Date  . Abnormality of gait   . Adenomatous polyp of colon 2002   36mm  . Allergic rhinitis   . Anxiety   . Anxiety and depression   . Chronic back pain   . Dementia without behavioral disturbance ([place])   . Depression   . Diabetes ([place])   . Diverticulosis of colon   . Dry eye syndrome   . Dysphagia   . Dysthymic disorder   . Fibromyalgia   . GERD (gastroesophageal reflux disease)   . H/O hiatal hernia   . History of adverse drug reaction   . History of cerebrovascular disease 09/24/2014  . History of recurrent UTIs   . Hypertension, benign   . Irritable bowel syndrome   . Low back pain syndrome   . Memory loss   . Mitral valve prolapse   . Paroxysmal A-fib ([place])   . Peripheral neuropathy    "both feet and legs"  . Physical deconditioning   . Sjogren's syndrome ([place])   . Therapeutic opioid-induced constipation ([place])   . Thyroid nodule   . Urinary incontinence     Family History  Problem Relation Age of Onset  . Heart disease Father        heart attack  . Pneumonia Father   . Heart attack Mother   . Hypertension Mother   . Colon cancer Sister   . Kidney disease Daughter   . Asthma Daughter   . Arthritis Daughter  29       osteo,  . Heart disease Son 39       stage 3 CHF(Diastolic /Systolic)  . Throat cancer Brother   . Hypertension Maternal Grandmother    Past Surgical History:  Procedure Laterality Date  . ABDOMINAL HYSTERECTOMY  1967  . APPENDECTOMY    . CARDIAC CATHETERIZATION  02/17/2003   normal L main, LAD free of disease, Cfx free of disease, RCA free of disease (Dr. Loni Muse. [place])  . CATARACT EXTRACTION, BILATERAL    . CHOLECYSTECTOMY  2000  . COLONOSCOPY W/ BIOPSIES     multiple  . DENTAL SURGERY     multiple tooth extractions  . ESOPHAGOGASTRODUODENOSCOPY (EGD) WITH ESOPHAGEAL DILATION N/A 08/23/2012   Procedure: ESOPHAGOGASTRODUODENOSCOPY (EGD) WITH ESOPHAGEAL DILATION;  Surgeon: Milus Banister, MD;  Location: [facility];  Service: Endoscopy;  Laterality: N/A;  . NASAL SEPTUM SURGERY  1980  . NM MYOCAR PERF WALL MOTION  2003   persantine - normal static and dynamic study w/apical thinning and presvered LV function, no ischemia  . SINUS EXPLORATION     ossifiying fibroma  . TEMPOROMANDIBULAR JOINT SURGERY  1986   Dr. Terence Lux  .  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    Patient is Widowed.    Patient has 2 children.    Patient is retired.    Former smoker   Alcohol none   Exercise Walk, exercise chair 4 days a week   POA    Walks with cane      Patient drinks about 1-2 cups of hot tea daily.   Patient is right handed.               Immunization History  Administered Date(s) Administered  . Fluad Quad(high Dose 65+) 11/08/2018  . Influenza Split 12/26/2010, 01/15/2012  . Influenza Whole 12/30/2008, 11/15/2009  . Influenza, High Dose Seasonal PF 12/21/2016, 11/05/2017  . Influenza,inj,Quad PF,6+ Mos 02/04/2013, 11/03/2013, 12/05/2014, 12/13/2015  . Pneumococcal Conjugate-13 06/12/2014  . Pneumococcal Polysaccharide-23 02/15/2009, 12/05/2014  .  Tdap 12/08/2013  . Zoster 01/29/2012     Objective: Vital Signs: BP 125/72 (BP Location: Left Arm, Patient Position: Sitting, Cuff Size: Normal)   Pulse 82   Resp 15   Ht 5\' 3"  (1.6 m)   Wt 152 lb (68.9 kg)   BMI 26.93 kg/m    Physical Exam Vitals and nursing note reviewed.  Constitutional:      Appearance: She is well-developed.  HENT:     Head: Normocephalic and atraumatic.  Eyes:     Conjunctiva/sclera: Conjunctivae normal.  Cardiovascular:     Rate and Rhythm: Normal rate and regular rhythm.     Heart sounds: Normal heart sounds.  Pulmonary:     Effort: Pulmonary effort is normal.     Breath sounds: Normal breath sounds.  Abdominal:     General: Bowel sounds are normal.     Palpations: Abdomen is soft.  Musculoskeletal:     Cervical back: Normal range of motion.  Lymphadenopathy:     Cervical: No cervical adenopathy.  Skin:    General: Skin is warm and dry.     Capillary Refill: Capillary refill takes less than 2 seconds.  Neurological:     Mental Status: She is alert and oriented to person, place, and time.  Psychiatric:        Behavior: Behavior normal.      Musculoskeletal Exam: C-spine good range of motion.  Thoracic kyphosis noted.  Limited range of motion lumbar spine.  Shoulder joints have good range of motion.  Elbow joints have good range of motion with no tenderness or inflammation.  Wrist joints have good range of motion with no discomfort.  She has synovial thickening of bilateral CMC joints.  No tenderness or synovitis of MCP or PIP joints.  She has mild PIP synovial thickening.  Hip joints have good range of motion.  No tenderness over trochanter bursa bilaterally.  She is slightly limited extension of both knee joints.  No warmth or effusion of knee joints noted.  Ankle joints have good range of motion with no tenderness or inflammation.  CDAI Exam: CDAI Score: -- Patient Global: --; Provider Global: -- Swollen: --; Tender: -- Joint Exam 03/19/2019    No joint exam has been documented for this visit   There is currently no information documented on the homunculus. Go to the Rheumatology activity and complete the homunculus joint exam.  Investigation: No additional findings.  Imaging: CT ABDOMEN PELVIS W CONTRAST  Result Date: 03/04/2019 CLINICAL DATA:  Mid abdominal pain and swelling. Sjogren's syndrome. Bowel obstruction suspected. EXAM: CT ABDOMEN AND PELVIS WITH CONTRAST TECHNIQUE: Multidetector CT imaging of the abdomen and pelvis was performed using the standard protocol following bolus administration of intravenous contrast. CONTRAST:  162mL OMNIPAQUE IOHEXOL 300 MG/ML  SOLN COMPARISON:  10/29/2018 FINDINGS: Lower chest: Clear lung bases. Normal heart size without pericardial or pleural effusion. Hepatobiliary: Inferior right hepatic lobe heterogeneously enhancing 3.2 cm lesion on 49/2 may have enlarged from 2.7 cm on the prior. Hepatomegaly versus a Riedel's lobe at 20.5 cm craniocaudal. Cholecystectomy with upper normal intrahepatic and extrahepatic ductal size. Common duct measures on the order of 9 mm on 29/2 and is not significantly changed. Pancreas: Pancreatic atrophy, most significant the body and head. Spleen: Normal in size, without focal abnormality. Adrenals/Urinary Tract: Normal adrenal glands. Normal right kidney. Left renal too small to characterize lesions. Previously described bladder wall thickening has resolved. There is air within the nondependent bladder including on 71/2. Stomach/Bowel: Proximal gastric underdistention. Scattered colonic diverticula. Normal terminal ileum. Normal small bowel. Vascular/Lymphatic: Aortic atherosclerosis. No abdominopelvic adenopathy. Reproductive: Hysterectomy.  No adnexal mass. Other: No significant free fluid. Mild pelvic floor laxity. Tiny fat containing left inguinal hernia. Musculoskeletal: Osteopenia. Degenerate disc disease at the lumbosacral junction. IMPRESSION: 1.  No acute  process in the abdomen or pelvis. 2. Right hepatic lobe lesion, possibly enlarged compared to 10/29/2018. When the patient is clinically stable and able to follow directions and hold their breath (preferably as an outpatient) further evaluation with dedicated abdominal MRI should be considered. 3. Gas within the urinary bladder could be iatrogenic. Correlate with instrumentation. Electronically Signed   By: Abigail Miyamoto M.D.   On: 03/04/2019 17:02    Recent Labs: Lab Results  Component Value Date   WBC 7.0 03/04/2019   HGB 13.7 03/04/2019   PLT 207 03/04/2019   NA 138 03/04/2019   K 4.1 03/04/2019   CL 102 03/04/2019   CO2 26 03/04/2019   GLUCOSE 118 (H) 03/04/2019   BUN 25 (H) 03/04/2019   CREATININE 0.78 03/04/2019   BILITOT 0.7 03/04/2019   ALKPHOS 89 03/04/2019   AST 24 03/04/2019   ALT 22 03/04/2019   PROT 8.1 03/04/2019   ALBUMIN 4.5 03/04/2019   CALCIUM 9.9 03/04/2019   GFRAA >60 03/04/2019    Speciality Comments: PLQ eye exam: 10/10/2017 Normal. Dr. Prudencio Burly. Follow up in 12 months.  Procedures:  No procedures performed Allergies: Banana, Codeine, Klonopin [clonazepam], Meperidine hcl, Norflex [orphenadrine citrate], Oxycodone-acetaminophen, Propoxyphene hcl, Zoloft [sertraline hcl], Doxycycline, Naproxen, Penicillins, Phenothiazines, Stelazine, Sulfamethoxazole-trimethoprim, Tolectin [tolmetin sodium], and Tramadol        Assessment / Plan:     Visit Diagnoses: Sjogren's syndrome with other organ involvement ([place]) - ANA+, Ro-, La -She has chronic sicca symptoms. She continues to take pilocarpine 5 mg BID for symptomatic relief.  She does not need any refills of pilocarpine at this time.  She also uses biotene mouth rinse and refresh eyedrops as needed for symptomatic relief.  She has no synovitis on exam.  She has chronic pain in both hands, both knee joints, and both feet.  She was advised to notify us if she develops any new or worsening symptoms.  She will follow-up in  the office in 1 year.  High risk medication use: She is no longer taking Plaquenil.  Primary osteoarthritis of both hands: She has tenderness and synovial thickening of bilateral CMC joints.  No synovitis noted.  She has complete fist formation bilaterally.  Joint protection and muscle strengthening were discussed.  Primary osteoarthritis of both feet: Chronic pain.  No inflammation at this time.  She wears proper fitting shoes. She uses a cane or a walker to assist with ambulation.   Trochanteric bursitis of right hip: No tenderness to palpation on exam.   DDD (degenerative disc disease), lumbar: Chronic pain.  She has limited ROM with discomfort.  She has midline spinal tenderness in the lumbar region.  Other medical conditions are listed as follows:  Osteopenia of multiple sites  Hereditary and idiopathic peripheral neuropathy  History of atrial fibrillation  History of hypercholesterolemia  History of TIA (transient ischemic attack)  History of hypertension  History of memory loss  History of gastroesophageal reflux (GERD)  History of insomnia  History of diabetes mellitus  Orders: No orders of the defined types were placed in this encounter.  No orders of the defined types were placed in this encounter.     Follow-Up Instructions: Return in about 1 year (around 03/18/2020) for Sjogren's syndrome, Osteoarthritis.   Hazel Sams, PA-C  I examined and evaluated the patient with Hazel Sams PA.  Patient had no synovitis on my examination today.  She continues to have sicca symptoms.  Over-the-counter products were discussed.  Her main concern today was abdominal bloating for which she has been having work-up.  The plan of care was discussed as noted above.  Bo Merino, MD  Note - This record has been created using Editor, commissioning.  Chart creation errors have been sought, but may not always  have been located. Such creation errors do not reflect on  the  standard of medical care.

## 2019-03-19 ENCOUNTER — Encounter: Payer: Self-pay | Admitting: Rheumatology

## 2019-03-19 ENCOUNTER — Ambulatory Visit: Payer: Medicare Other | Admitting: Rheumatology

## 2019-03-19 ENCOUNTER — Other Ambulatory Visit: Payer: Self-pay

## 2019-03-19 VITALS — BP 125/72 | HR 82 | Resp 15 | Ht 63.0 in | Wt 152.0 lb

## 2019-03-19 DIAGNOSIS — M19042 Primary osteoarthritis, left hand: Secondary | ICD-10-CM

## 2019-03-19 DIAGNOSIS — M19071 Primary osteoarthritis, right ankle and foot: Secondary | ICD-10-CM

## 2019-03-19 DIAGNOSIS — Z8639 Personal history of other endocrine, nutritional and metabolic disease: Secondary | ICD-10-CM

## 2019-03-19 DIAGNOSIS — M19041 Primary osteoarthritis, right hand: Secondary | ICD-10-CM

## 2019-03-19 DIAGNOSIS — Z8673 Personal history of transient ischemic attack (TIA), and cerebral infarction without residual deficits: Secondary | ICD-10-CM

## 2019-03-19 DIAGNOSIS — Z79899 Other long term (current) drug therapy: Secondary | ICD-10-CM

## 2019-03-19 DIAGNOSIS — M3509 Sicca syndrome with other organ involvement: Secondary | ICD-10-CM | POA: Diagnosis not present

## 2019-03-19 DIAGNOSIS — Z87898 Personal history of other specified conditions: Secondary | ICD-10-CM

## 2019-03-19 DIAGNOSIS — M5136 Other intervertebral disc degeneration, lumbar region: Secondary | ICD-10-CM

## 2019-03-19 DIAGNOSIS — Z8719 Personal history of other diseases of the digestive system: Secondary | ICD-10-CM

## 2019-03-19 DIAGNOSIS — G609 Hereditary and idiopathic neuropathy, unspecified: Secondary | ICD-10-CM

## 2019-03-19 DIAGNOSIS — M8589 Other specified disorders of bone density and structure, multiple sites: Secondary | ICD-10-CM

## 2019-03-19 DIAGNOSIS — M7061 Trochanteric bursitis, right hip: Secondary | ICD-10-CM

## 2019-03-19 DIAGNOSIS — M19072 Primary osteoarthritis, left ankle and foot: Secondary | ICD-10-CM

## 2019-03-19 DIAGNOSIS — Z8679 Personal history of other diseases of the circulatory system: Secondary | ICD-10-CM

## 2019-03-26 ENCOUNTER — Other Ambulatory Visit: Payer: Self-pay | Admitting: *Deleted

## 2019-03-26 MED ORDER — ALPRAZOLAM 0.25 MG PO TABS: 0.2500 mg | ORAL_TABLET | Freq: Two times a day (BID) | ORAL | 0 refills | Status: DC | PRN

## 2019-03-26 NOTE — Telephone Encounter (Signed)
Patient requested refill NCCSRS Database Verified LR: 02/25/2019 Narcotic Contract updated.  Pended Rx and sent to [name] for approval.

## 2019-03-27 DIAGNOSIS — Z961 Presence of intraocular lens: Secondary | ICD-10-CM | POA: Diagnosis not present

## 2019-03-27 DIAGNOSIS — E119 Type 2 diabetes mellitus without complications: Secondary | ICD-10-CM | POA: Diagnosis not present

## 2019-03-27 DIAGNOSIS — H04123 Dry eye syndrome of bilateral lacrimal glands: Secondary | ICD-10-CM | POA: Diagnosis not present

## 2019-03-27 DIAGNOSIS — Z79899 Other long term (current) drug therapy: Secondary | ICD-10-CM | POA: Diagnosis not present

## 2019-03-29 ENCOUNTER — Other Ambulatory Visit: Payer: Medicare Other

## 2019-03-31 ENCOUNTER — Other Ambulatory Visit: Payer: Self-pay

## 2019-03-31 ENCOUNTER — Ambulatory Visit
Admission: RE | Admit: 2019-03-31 | Discharge: 2019-03-31 | Disposition: A | Discharge status: Home / Self Care | Payer: Medicare Other | Source: Ambulatory Visit | Attending: Internal Medicine | Admitting: Internal Medicine

## 2019-03-31 DIAGNOSIS — N281 Cyst of kidney, acquired: Secondary | ICD-10-CM | POA: Diagnosis not present

## 2019-03-31 DIAGNOSIS — R1011 Right upper quadrant pain: Secondary | ICD-10-CM

## 2019-03-31 DIAGNOSIS — K7689 Other specified diseases of liver: Secondary | ICD-10-CM | POA: Diagnosis not present

## 2019-03-31 DIAGNOSIS — K769 Liver disease, unspecified: Secondary | ICD-10-CM

## 2019-03-31 DIAGNOSIS — R140 Abdominal distension (gaseous): Secondary | ICD-10-CM

## 2019-03-31 MED ORDER — GADOBENATE DIMEGLUMINE 529 MG/ML IV SOLN
14.0000 mL | Freq: Once | INTRAVENOUS | Status: AC | PRN
  Administered 2019-03-31: 14 mL via INTRAVENOUS

## 2019-03-31 NOTE — Progress Notes (Signed)
Please inform her daughter (not patient due to her dementia).  Please read this to her:  MRI shows the mass in the liver.  Unfortunately, it still does not distinguish if it's benign or cancerous.   She is not going to tolerate invasive testing to look into this more.  Each test she has makes her very anxious.  She does not have signs that make me concerned about cancer, and she would not tolerate having a part of her liver removed, having chemo or radiation if those were indicated, so I recommend observing and treating her symptoms if she develops them in that local area (right upper quadrant not lower abdomen where she gets constipated and gassy).

## 2019-04-04 ENCOUNTER — Other Ambulatory Visit: Payer: Self-pay | Admitting: *Deleted

## 2019-04-04 ENCOUNTER — Other Ambulatory Visit: Payer: Self-pay | Admitting: Internal Medicine

## 2019-04-04 DIAGNOSIS — M545 Low back pain, unspecified: Secondary | ICD-10-CM

## 2019-04-04 DIAGNOSIS — G6289 Other specified polyneuropathies: Secondary | ICD-10-CM

## 2019-04-04 MED ORDER — PREGABALIN 100 MG PO CAPS: 100.0000 mg | ORAL_CAPSULE | Freq: Two times a day (BID) | ORAL | 0 refills | Status: DC

## 2019-04-04 NOTE — Telephone Encounter (Signed)
Patients daughter aware message received and awaiting approval from Dr.Reed.

## 2019-04-04 NOTE — Telephone Encounter (Signed)
Patient daughter, Afrin requested refill NCCSRS Database Verified LR: 03/04/19 Pended Rx and sent to [name] for approval.

## 2019-04-07 ENCOUNTER — Encounter: Payer: Self-pay | Admitting: Internal Medicine

## 2019-04-07 ENCOUNTER — Other Ambulatory Visit: Payer: Self-pay

## 2019-04-07 ENCOUNTER — Telehealth (INDEPENDENT_AMBULATORY_CARE_PROVIDER_SITE_OTHER): Payer: Medicare Other | Admitting: Internal Medicine

## 2019-04-07 DIAGNOSIS — G629 Polyneuropathy, unspecified: Secondary | ICD-10-CM

## 2019-04-07 DIAGNOSIS — F0150 Vascular dementia without behavioral disturbance: Secondary | ICD-10-CM | POA: Diagnosis not present

## 2019-04-07 DIAGNOSIS — R160 Hepatomegaly, not elsewhere classified: Secondary | ICD-10-CM

## 2019-04-07 DIAGNOSIS — K582 Mixed irritable bowel syndrome: Secondary | ICD-10-CM | POA: Diagnosis not present

## 2019-04-07 NOTE — Progress Notes (Signed)
This service is provided via telemedicine--video visit.  No vital signs collected/recorded due to the encounter was a telemedicine visit.   Location of [name] (ex: home, work): home  [name] consents to a telephone visit:  Yes  Location of the provider (ex: office, home):  [place]  Name of any referring provider: Dr. Hollace Kinnier DO Names of all persons participating in the telemedicine service and their role in the encounter: [name] DO, Ariana [name] CMA, Ariana [name] caregiver, [name]  Time spent on call: 7 min with medical assistant   Virtual Visit via Video Note  I connected with Ariana [name] on 04/07/19 at  2:15 PM EST by a video enabled telemedicine application and verified that I am speaking with the correct person using two identifiers.  Location: [name]: Home with caregiver Ariana [name] Provider: [facility] office   I discussed the limitations of evaluation and management by telemedicine and the availability of in person appointments. The [name] expressed understanding and agreed to proceed.  History of Present Illness: 86 yo female with bipolar, vascular dementia, urinary incontinence, frailty and frequent falls seen virtually to review results of her MRI of the liver.     Findings were as follows:   1. Mass within inferior right hepatic lobe is identified. The imaging characteristics are nonspecific and may represent either a benign or malignant neoplasm. In a [name] who has known cirrhosis or hepatitis B hepatoma is a potential diagnostic consideration. In the absence of a known primary malignancy or risk factors for liver tumor consider further evaluation with tissue sampling. 2. Left kidney hemorrhagic cyst. 3. Cholecystectomy. 4. Increase caliber of the common bile duct status post cholecystectomy. No choledocholithiasis or obstructing mass noted. 5. Small, too small to reliably characterize left kidney lesions.  Liver labs are normal.  She is c/o pain in her feet when she's walking  and even when she is not.    She's sleeping a lot.  Ariana [name] has to wake her for breakfast, for lunch and for her 5pm meds and to tell her what's there for dinner.  Last night, her BM was very hard and didn't want to come out.  She's been taking medications plus prune juice.  Getting miralax in tea each am.  Also has stool softener and linzess.  Says she looks pregnant from bloating.    Observations/Objective: She looks well.  Alert and talking.  Has her stories all mixed up as usual.  She continues to talk about bloating and hard stools.  Discussed hydration and moving around.  Does drink a full glass of water with each meal 9/2/5.  Also gets some b/w.  Discussed continuing the prune juice regularly.    BP 127/70 today.     Assessment and Plan: 1. Liver mass -reviewed results with pt and her caregiver today -I've reached out to GI about her case b/c I don't think she's a good candidate for any type of surgery to evaluate this with her dementia, frailty and bipolar--she's so high risk for cognitive and functional decline in the perioperative period and she does not have adequate full-time support at home -her daytime caregiver during the week is wonderful (but she has no one at night or weekends and daughter lives elsewhere and cannot drive)  2. Vascular dementia without behavioral disturbance ([place]) -progressing gradually MMSE - Mini Mental State Exam 11/08/2018 11/05/2017 04/10/2016  Orientation to time 4 4 3   Orientation to Place 4 5 5   Registration 3 3 3   Attention/ Calculation 5 5 5  Recall 0 0 1  Language- name 2 objects 2 2 2   Language- repeat 1 1 1   Language- follow 3 step command 3 3 1   Language- read & follow direction 1 1 1   Write a sentence 1 1 0  Copy design 1 1 1   Total score 25 26 23   -repeats stories again and again -ideally would benefit from at least AL level of care but family has never followed through with these recommendations -cont namenda  3. Irritable bowel  syndrome with both constipation and diarrhea -cont prune juice, miralax, stool softener, linzess and water intake, plus trying to walk around with walker -bothered more by constipation the past several years and bloating -not helped by her xanax, pilocarpine,   4. Peripheral polyneuropathy -continues on lyrica for this and not a good candidate for more with her fall risk and confusion  Follow Up Instructions: I discussed the assessment and treatment plan with the [name]. The [name] was provided an opportunity to ask questions and all were answered. The [name] agreed with the plan and demonstrated an understanding of the instructions.   The [name] was advised to call back or seek an in-person evaluation if the symptoms worsen or if the condition fails to improve as anticipated.  I provided 30 minutes of non-face-to-face time during this encounter.  Lani Havlik L. Larrie Fraizer, D.O. [facility] [address], [place] [address] Cell Phone (Mon-Fri 8am-5pm):  [phone] On Call:  [phone] & follow prompts after 5pm & weekends Office Phone:  [phone] Office Fax:  [phone]

## 2019-04-17 ENCOUNTER — Encounter: Payer: Self-pay | Admitting: Internal Medicine

## 2019-04-17 ENCOUNTER — Other Ambulatory Visit: Payer: Self-pay

## 2019-04-17 ENCOUNTER — Encounter (INDEPENDENT_AMBULATORY_CARE_PROVIDER_SITE_OTHER): Payer: Self-pay

## 2019-04-17 ENCOUNTER — Ambulatory Visit (INDEPENDENT_AMBULATORY_CARE_PROVIDER_SITE_OTHER): Payer: Medicare Other | Admitting: Internal Medicine

## 2019-04-17 VITALS — BP 122/73 | HR 77 | Temp 98.2°F | Ht 63.0 in | Wt 153.8 lb

## 2019-04-17 DIAGNOSIS — F03918 Unspecified dementia, unspecified severity, with other behavioral disturbance: Secondary | ICD-10-CM

## 2019-04-17 DIAGNOSIS — I480 Paroxysmal atrial fibrillation: Secondary | ICD-10-CM | POA: Diagnosis not present

## 2019-04-17 DIAGNOSIS — F0391 Unspecified dementia with behavioral disturbance: Secondary | ICD-10-CM

## 2019-04-17 DIAGNOSIS — Z9181 History of falling: Secondary | ICD-10-CM | POA: Diagnosis not present

## 2019-04-17 NOTE — Progress Notes (Signed)
OFFICE NOTE  Chief Complaint:  No complaints  Primary Care Physician: Gayland Curry, DO  HPI:  Ariana [name] an 86 year old female with history of some confusion in the past, hypertension and Sjogren syndrome. I had added lisinopril to her regimen which has helped control her blood pressure much better. She does report good control over her blood pressure over the past year; however, has had recent worsening problems such as cough, new productive sputum, problems with urinary tract infections. Weight is up back to 126 pounds after dip to 116 pounds but this is about where she is normally with a good appetite. Blood pressure is noted to be mildly elevated today; however, she is in some discomfort and has been short of breath with productive cough. Denies any chest pain. She does have pain in her back that shoots to her right thigh when sitting or leaning down. Today she has a number of other complaints. Her main issue is palpitations which she reports she had an episode of fast heart rate for almost one month but it has somewhat resolved. She still gets some recurrent palpitations.  I saw Ariana [name] back in the office today. She was recently seen by [name] and according to her notes his had marked progression of her dementia. She's now having significant behavioral disturbance as well as wandering. This is cause significant stress for her daughter and family. Compliance with medications is poor. There have been falls at home. Today in the office she is noted to be in new onset atrial fibrillation with rapid ventricular response. Heart rate is in the 120s. I had a long discussion with her daughter and her to the extent that she could understand about atrial fibrillation and her increased risk for stroke. Her CHADSVASC score is elevated at 4. She's also complaining of some abdominal discomfort but no significant chest pain. Blood pressure was low today at 90/70.  Unfortunately [name] returns  today for follow-up. She was recently hospitalized after an episode of transient global amnesia. She was ultimately found to have stroke with 2 clear punctate lesions on imaging. Is felt to be cardioembolic and may be related to her atrial fibrillation. She continues to have falls and in fact the fall was her initial presentation. As we discussed at length before she is at very high risk for complications on increased anticoagulation such as warfarin or a direct oral anticoagulant. Despite her CHADSVASC score now of 6, it is felt that anticoagulation is too risky. She has had an increase in her aspirin from 81 mg to 325 mg.  Ariana [name] returns today for follow-up. She continues to have significant behavioral issues related to dementia and possibly bipolar type symptoms. From a cardiac standpoint, her main complaints continue to be lower extremity swelling. She's not been fitted with compression stockings as it's been too difficult for her daughter to get her to [place]. She is requesting treatment for the edema although Ariana [name] reports that she does have significant incontinence. She continues to live at home and get some home assistance, but that is winding down after her recent stroke. She had another fall last week and I still feel is not a good candidate for anticoagulation beyond aspirin.  08/16/2015  I saw Ariana [name] back today in the office. Unfortunately she was recently admitted for UTI and was found to be bradycardic at the time. Beta blocker was discontinued although she remains on diltiazem. She was placed on low-dose losartan for  additional blood pressure control. Since then she's had some labile blood pressures including some blood pressures in the 90s and low [id number] systolic which is cause her to be dizzy and feel fatigued. Mention or some degree of autonomic dysfunction.  08/17/2016  Ariana [name] returns today for follow-up. Recently she's had some leg swelling and some weight gain. She was  put on Lasix by her primary care provider of her weight is fairly stable and not come down much. Daughter reported that she's been using boost on the weekends at least 3 or 4 times a day in addition to whatever she is eating. It could be that this is weight gain mostly related to increase calories. Blood pressure is actually been less labile recently. She is in an ectopic atrial rhythm today.  03/03/2017  Ariana [name] returns today for follow-up.  Overall she has a number of complaints mostly with her bowels.  She is gained some weight and reports some indigestion.  She has had trouble with constipation.  This is been an ongoing issue for her.  She has paroxysmal AF but is in sinus rhythm today.  She denies any chest pain.  She is not anticoagulated due to fall risk.  03/20/2018  Ariana [name] returns today for follow-up.  She has no new complaints.  She is accompanied by family member.  She denies any worsening shortness of breath or chest pain.  She is maintaining sinus rhythm from what I can tell.  Recent labs from July 2019 showed total cholesterol 183, HDL 63, LDL 99 and triglycerides 113.  Hemoglobin A1c was 6.  She has had only one fall in the past year.  Pressure was noted to be a little low today at 102/54.  04/17/2019  Ariana [name] is seen today in follow-up.  She is here with her granddaughter.  Unfortunately she continues to struggle with memory loss and was somewhat tangential today.  From her family members explanations though she seems to be physically doing reasonably well.  Blood pressures excellent today.  She denies any symptoms or worsening shortness of breath.  There is no evidence for any worsening heart failure and she remains on Lasix.  EKG today shows sinus rhythm with first-degree AV block.  She does have paroxysmal A. fib and is not anticoagulated.  She has been having recent falls which are significant and those continue.  PMHx:  Past Medical History:  Diagnosis Date  . Abnormality of gait     . Adenomatous polyp of colon 2002   34mm  . Allergic rhinitis   . Anxiety   . Anxiety and depression   . Chronic back pain   . Dementia without behavioral disturbance ([place])   . Depression   . Diabetes ([place])   . Diverticulosis of colon   . Dry eye syndrome   . Dysphagia   . Dysthymic disorder   . Fibromyalgia   . GERD (gastroesophageal reflux disease)   . H/O hiatal hernia   . History of adverse drug reaction   . History of cerebrovascular disease 09/24/2014  . History of recurrent UTIs   . Hypertension, benign   . Irritable bowel syndrome   . Low back pain syndrome   . Memory loss   . Mitral valve prolapse   . Paroxysmal A-fib ([place])   . Peripheral neuropathy    "both feet and legs"  . Physical deconditioning   . Sjogren's syndrome ([place])   . Therapeutic opioid-induced constipation (OIC)   . Thyroid nodule   .  Urinary incontinence     Past Surgical History:  Procedure Laterality Date  . ABDOMINAL HYSTERECTOMY  1967  . APPENDECTOMY    . CARDIAC CATHETERIZATION  02/17/2003   normal L main, LAD free of disease, Cfx free of disease, RCA free of disease (Dr. Loni Muse. [place])  . CATARACT EXTRACTION, BILATERAL    . CHOLECYSTECTOMY  2000  . COLONOSCOPY W/ BIOPSIES     multiple  . DENTAL SURGERY     multiple tooth extractions  . ESOPHAGOGASTRODUODENOSCOPY (EGD) WITH ESOPHAGEAL DILATION N/A 08/23/2012   Procedure: ESOPHAGOGASTRODUODENOSCOPY (EGD) WITH ESOPHAGEAL DILATION;  Surgeon: Milus Banister, MD;  Location: [facility];  Service: Endoscopy;  Laterality: N/A;  . NASAL SEPTUM SURGERY  1980  . NM MYOCAR PERF WALL MOTION  2003   persantine - normal static and dynamic study w/apical thinning and presvered LV function, no ischemia  . SINUS EXPLORATION     ossifiying fibroma  . TEMPOROMANDIBULAR JOINT SURGERY  1986   Dr. Terence Lux  . TRANSTHORACIC ECHOCARDIOGRAM  2001   mild LVH, normal LV    FAMHx:  Family History  Problem Relation Age of Onset  . Heart disease Father         heart attack  . Pneumonia Father   . Heart attack Mother   . Hypertension Mother   . Colon cancer Sister   . Kidney disease Daughter   . Asthma Daughter   . Arthritis Daughter 59       osteo,  . Heart disease Son 70       stage 3 CHF(Diastolic /Systolic)  . Throat cancer Brother   . Hypertension Maternal Grandmother     SOCHx:   reports that she has quit smoking. She has never used smokeless tobacco. She reports that she does not drink alcohol or use drugs.  ALLERGIES:  Allergies  Allergen Reactions  . Banana Nausea And Vomiting  . Codeine Nausea Only    unless given with Phenergan  . Klonopin [Clonazepam] Other (See Comments)    Causes hallucination   . Meperidine Hcl Nausea Only    unless given with Phenergan  . Norflex [Orphenadrine Citrate] Nausea Only    Unless given with Phenergan  . Oxycodone-Acetaminophen Nausea Only    unless given with phenergan  . Propoxyphene Hcl Nausea Only    unless given with phenergan  . Zoloft [Sertraline Hcl] Other (See Comments)    Caused lethargy  . Doxycycline Other (See Comments)    Unknown reaction  . Naproxen Other (See Comments)    Unknown reaction  . Penicillins Other (See Comments)    Unknown reaction Has patient had a PCN reaction causing immediate rash, facial/tongue/throat swelling, SOB or lightheadedness with hypotension: Unknown Has patient had a PCN reaction causing severe rash involving mucus membranes or skin necrosis: Unknown Has patient had a PCN reaction that required hospitalization: pt was in the hospital at time of reaction Has patient had a PCN reaction occurring within the last 10 years: Unknown If all of the above answers are "NO", then may proceed with Cephalos  . Phenothiazines Other (See Comments)    Unknown reaction  . Stelazine Other (See Comments)    Unknown reaction  . Sulfamethoxazole-Trimethoprim Other (See Comments)    Unknown reaction  . Tolectin [Tolmetin Sodium] Other (See Comments)     Unknown reaction  . Tramadol Other (See Comments)    Unknown reaction    ROS: Pertinent items noted in HPI and remainder of comprehensive ROS otherwise negative.  HOME MEDS: Current Outpatient Medications  Medication Sig Dispense Refill  . acetaminophen (TYLENOL) 500 MG tablet Take 1,000 mg by mouth 2 (two) times daily.    Marland Kitchen ALPRAZolam (XANAX) 0.25 MG tablet Take 1 tablet (0.25 mg total) by mouth 2 (two) times daily as needed. 60 tablet 0  . antiseptic oral rinse (BIOTENE) LIQD 15 mLs by Mouth Rinse route 5 (five) times daily as needed for dry mouth.    Marland Kitchen aspirin 325 MG tablet Take 1 tablet (325 mg total) by mouth daily. 30 tablet 2  . Capsaicin-Menthol-Methyl Sal (CAPSAICIN-METHYL SAL-MENTHOL) 0.025-1-12 % CREA Apply 1 application topically 2 (two) times daily. 56.6 g 0  . carboxymethylcellulose (REFRESH TEARS) 0.5 % SOLN Place 1 drop into both eyes 5 (five) times daily as needed (dry eyes).     . cephALEXin (KEFLEX) 500 MG capsule Take 1 capsule (500 mg total) by mouth 3 (three) times daily. 21 capsule 0  . CRANBERRY PO Take 2 tablets by mouth daily.    Marland Kitchen diltiazem (CARDIZEM CD) 180 MG 24 hr capsule Take 1 capsule (180 mg total) by mouth daily. **needs ov** 60 capsule 0  . DULoxetine (CYMBALTA) 30 MG capsule Take one capsule by mouth once daily for back pain and for depression. 90 capsule 0  . DULoxetine (CYMBALTA) 60 MG capsule Take 1 capsule (60 mg total) by mouth daily. 90 capsule 0  . furosemide (LASIX) 40 MG tablet Take 1 tablet (40 mg total) by mouth daily. 90 tablet 0  . hydrocortisone (ANUSOL-HC) 2.5 % rectal cream Place 1 application rectally daily. Use for 10 days 30 g 0  . lansoprazole (PREVACID) 30 MG capsule Take one capsule by mouth once daily at noon 90 capsule 1  . linaclotide (LINZESS) 290 MCG CAPS capsule TAKE 1 CAPSULE BY MOUTH EVERY DAY AT BEDTIME 30 capsule 4  . memantine (NAMENDA) 10 MG tablet Take 1 tablet (10 mg total) by mouth 2 (two) times daily. 180 tablet 1  .  mupirocin ointment (BACTROBAN) 2 % Place 1 application into the nose 2 (two) times daily. Right nostril until soreness resolves 22 g 0  . MYRBETRIQ 50 MG TB24 tablet Take 1 tablet by mouth once daily 90 tablet 1  . NIACIN PO Take by mouth daily.    Marland Kitchen nystatin (MYCOSTATIN) 100000 UNIT/ML suspension Take 5 mLs (500,000 Units total) by mouth 4 (four) times daily. Until soreness of mouth resolves 473 mL 0  . ONE TOUCH LANCETS MISC Use to test blood sugar twice daily.  Dx: E11.9 100 each 11  . ONETOUCH VERIO test strip USE ONE STRIP TO CHECK GLUCOSE TWICE DAILY AS DIRECTED 100 each 0  . pilocarpine (SALAGEN) 5 MG tablet Take 1 tablet (5 mg total) by mouth 2 (two) times daily. 180 tablet 0  . polyethylene glycol (MIRALAX / GLYCOLAX) packet Take by mouth daily as needed (constipation). Mix 1/2 capful of miralax in 8 oz liquid and drink    . potassium chloride SA (K-DUR) 20 MEQ tablet Take 1 tablet by mouth once daily 90 tablet 3  . pregabalin (LYRICA) 100 MG capsule Take 1 capsule (100 mg total) by mouth 2 (two) times daily. 60 capsule 0  . sitaGLIPtin (JANUVIA) 100 MG tablet Take 1 tablet (100 mg total) by mouth daily. 90 tablet 1   No current facility-administered medications for this visit.    LABS/IMAGING: No results found for this or any previous visit (from the past 48 hour(s)). No results found.  VITALS: BP  122/73   Pulse 77   Temp 98.2 F (36.8 C)   Ht 5\' 3"  (1.6 m)   Wt 153 lb 12.8 oz (69.8 kg)   SpO2 91%   BMI 27.24 kg/m   EXAM: General appearance: alert and no distress Neck: no carotid bruit and no JVD Lungs: clear to auscultation bilaterally Heart: regular rate and rhythm, S1, S2 normal, no murmur, click, rub or gallop Abdomen: soft, non-tender; bowel sounds normal; no masses,  no organomegaly Extremities: extremities normal, atraumatic, no cyanosis or edema Pulses: 2+ and symmetric Skin: Skin color, texture, turgor normal. No rashes or lesions Neurologic: Grossly  normal Psych: Pleasant  EKG: Sinus rhythm first-degree AV block at 89, nonspecific T wave changes-personally reviewed  ASSESSMENT: 1. Paroxysmal atrial fibrillation with rapid ventricular response - CHADSVASC 6 (not on warfarin due to falls) 2. Recent stroke 3. Labile hypertension - allowing higher BP than normal 4. Chest wall pain 5. Palpitations 6. Sjogren syndrome 7. Progressive dementia with behavioral disturbance 8. Falls and medication noncompliance 9. Asymmetric left lower extremity edema  PLAN: 1.   Ariana [name] is in sinus rhythm today but has PAF and stroke.  Unfortunately she has not anticoagulated due to medication noncompliance and continued falls.  She has had worsening progressive dementia which is significant at this point.  We will plan to continue her current medications.  Follow-up with me annually or sooner as necessary.  Pixie Casino, MD, [facility], [place] Director of the Advanced Lipid Disorders &  Cardiovascular Risk Reduction Clinic Diplomate of the American Board of Clinical Lipidology Attending Cardiologist  Direct Dial: [phone]  Fax: [phone]  Website:  [email or web address] 04/17/2019, 3:33 PM

## 2019-04-17 NOTE — Patient Instructions (Signed)
Medication Instructions:  Your physician recommends that you continue on your current medications as directed. Please refer to the Current Medication list given to you today.  *If you need a refill on your cardiac medications before your next appointment, please call your pharmacy*   Follow-Up: At [facility], you and your health needs are our priority.  As part of our continuing mission to provide you with exceptional heart care, we have created designated Provider Care Teams.  These Care Teams include your primary Cardiologist (physician) and Advanced Practice Providers (APPs -  Physician Assistants and Nurse Practitioners) who all work together to provide you with the care you need, when you need it.  We recommend signing up for the patient portal called "MyChart".  Sign up information is provided on this After Visit Summary.  MyChart is used to connect with patients for Virtual Visits (Telemedicine).  Patients are able to view lab/test results, encounter notes, upcoming appointments, etc.  Non-urgent messages can be sent to your provider as well.   To learn more about what you can do with MyChart, go to [email or web address].    Your next appointment:   12 month(s)  The format for your next appointment:   In Person  Provider:   You may see Dr. Hilty or one of the following Advanced Practice Providers on your designated Care Team:    Hao Meng, PA-C  Angela Duke, PA-C or   Krista Kroeger, PA-C    Other Instructions   

## 2019-04-18 ENCOUNTER — Encounter: Payer: Self-pay | Admitting: Internal Medicine

## 2019-04-22 ENCOUNTER — Telehealth (INDEPENDENT_AMBULATORY_CARE_PROVIDER_SITE_OTHER): Payer: Medicare Other | Admitting: Internal Medicine

## 2019-04-22 DIAGNOSIS — G629 Polyneuropathy, unspecified: Secondary | ICD-10-CM

## 2019-04-22 DIAGNOSIS — Z609 Problem related to social environment, unspecified: Secondary | ICD-10-CM

## 2019-04-22 DIAGNOSIS — K582 Mixed irritable bowel syndrome: Secondary | ICD-10-CM

## 2019-04-22 DIAGNOSIS — R160 Hepatomegaly, not elsewhere classified: Secondary | ICD-10-CM

## 2019-04-22 DIAGNOSIS — F0150 Vascular dementia without behavioral disturbance: Secondary | ICD-10-CM | POA: Diagnosis not present

## 2019-04-22 DIAGNOSIS — Z7189 Other specified counseling: Secondary | ICD-10-CM

## 2019-04-22 NOTE — Telephone Encounter (Signed)
Reviewed results with patient's daughter, Javayah:  Explained that there is a mass in the liver that may be benign, could be malignant and we are not certain without a tissue sample; however, she is not a particularly high risk individual for cancer--has not had weight loss or actual pain in the right upper quadrant area where the mass is (pain has been in lower abdomen and associated with constipation or in her back where she has chronic DDD).  We reviewed that Dr. Carlean Purl and I have both reviewed her chart, the imaging and the reports and we agree that she is not a good candidate for a biopsy unless it was helping to prognosticate or we would put her through intensive treatment for the liver mass.  I do not think she is in good enough health physically or mentally to tolerate a biopsy or treatment.  Pt's daughter, Tnya, expressed understanding of this and the fact that I don't believe that her mother's pain is related to the "mass".    Turns out, I called her right after a Education officer, museum from [employer] had just gotten through calling her and asking her questions about her mother's care.  It's unclear who initiated this call (a home health, a caregiver, the ED, our office, etc).  I did review again with [name] that we have recommended that her mother move to an assisted living level of care.  Joleah says that she has looked into this and ran into numerous barriers:  1) mother makes too much money for AL  2) mother makes too little money for SNF  3)  Mother is high risk for covid and she's worried about that at this current time--would prefer to wait until there were more people vaccinated 4) she could not find many of the necessary documents if she were to sell her mother's home and apply for medicaid for her 5) her mother gets angry and upset when the idea is brought up.  We discussed that she is unsafe in her home alone on the weekends.  Adelheid says her husband is over there 4 times each  weekend, a friend of the patient's in her 77s comes and checks on her plus she has her weekday caregivers.  She went on to explain that her husband lost his job and they have been paying for her mother's care, medical bills, etc.    Finally, I asked if we could try to connect her again with resources from the local ACO, [facility], if she'd be willing to get the process started to get her mother in a safe place, and she agreed to talk with someone.  She also said she'd answer the phone or call back if I had them to call her.    30 mins were spent on this ACP discussion.    Mahaley Schwering L. Keilah Lemire, D.O. [facility] [address], [place] [address] Cell Phone (Mon-Fri 8am-5pm):  [phone] On Call:  [phone] & follow prompts after 5pm & weekends Office Phone:  [phone] Office Fax:  [phone]

## 2019-05-01 ENCOUNTER — Other Ambulatory Visit: Payer: Self-pay

## 2019-05-01 ENCOUNTER — Other Ambulatory Visit: Payer: Self-pay | Admitting: Rheumatology

## 2019-05-01 DIAGNOSIS — F341 Dysthymic disorder: Secondary | ICD-10-CM

## 2019-05-01 DIAGNOSIS — M545 Low back pain, unspecified: Secondary | ICD-10-CM

## 2019-05-01 DIAGNOSIS — G6289 Other specified polyneuropathies: Secondary | ICD-10-CM

## 2019-05-01 DIAGNOSIS — M797 Fibromyalgia: Secondary | ICD-10-CM

## 2019-05-01 MED ORDER — ALPRAZOLAM 0.25 MG PO TABS: 0.2500 mg | ORAL_TABLET | Freq: Two times a day (BID) | ORAL | 0 refills | Status: DC | PRN

## 2019-05-01 MED ORDER — PREGABALIN 100 MG PO CAPS: 100.0000 mg | ORAL_CAPSULE | Freq: Two times a day (BID) | ORAL | 0 refills | Status: DC

## 2019-05-01 MED ORDER — POTASSIUM CHLORIDE CRYS ER 20 MEQ PO TBCR: 20.0000 meq | EXTENDED_RELEASE_TABLET | Freq: Every day | ORAL | 1 refills | Status: DC

## 2019-05-01 MED ORDER — DULOXETINE HCL 30 MG PO CPEP: ORAL_CAPSULE | ORAL | 1 refills | Status: DC

## 2019-05-01 MED ORDER — DULOXETINE HCL 60 MG PO CPEP: 60.0000 mg | ORAL_CAPSULE | Freq: Every day | ORAL | 1 refills | Status: DC

## 2019-05-01 NOTE — Telephone Encounter (Signed)
Last Visit: 03/19/19 Next visit: 03/18/20  Okay to refill per Dr. Deveshwar 

## 2019-05-01 NOTE — Telephone Encounter (Signed)
Incoming call received from patients daughter requesting refills on several medications. Ariana [name] is aware that she should contact the pharmacy yet she has had so many issues with pharmacy that she an Rodena Piety have developed a system where she calls here instead. Medications requested:  1.) Alprazolam, last filled 03/26/19 and non opioid treatment agreement on file. Dr.Reed will have to approve (controlled substance)  2.) Lyrica, last filled 04/04/2019  3.) Cymbalta x 2 different doses  4.) Potassium  All medications approved with the exception of controlled medications.

## 2019-05-06 ENCOUNTER — Other Ambulatory Visit: Payer: Self-pay | Admitting: *Deleted

## 2019-05-06 NOTE — Patient Outreach (Signed)
[facility]) Care Management  05/06/2019  Ariana [name] [birth year] [id number]    Telephone Assessment-Unsuccessful  RN attempted outreach call however unsuccessful. Rn able to leave a HIPAA approved voice message requesting a call back. Will further engage at that time.  Plan: Will send outreach letter and rescheduled another outreach call over the next week.  Raina Mina, RN Care Management Coordinator [place] Office [phone]

## 2019-05-12 ENCOUNTER — Other Ambulatory Visit: Payer: Self-pay | Admitting: *Deleted

## 2019-05-12 ENCOUNTER — Telehealth: Payer: Self-pay | Admitting: *Deleted

## 2019-05-12 NOTE — Telephone Encounter (Signed)
Sounds like an EKG is appropriate.  Pain down her leg is from her chronic back pain, I'd imagine.  She takes lyrica for it.

## 2019-05-12 NOTE — Patient Outreach (Signed)
[facility]) Care Management  05/12/2019  Ariana [name] 25-Jul-1929 [id number]    Telephone Assessment-Incoming Call from Daughter [name])  RN received a call on Friday 3/26 from pt's daughter Arlethia Boch who confirmed call received from this RN case manager last week. Caregiver indicated she was working on a project and would Film/video editor back sometime today. RN awaiting on this call back and will further discuss pt's needs at that time.   Note caregiver also left her contact number via cell [phone].  Plan: Will await a call back today from pt's caregiver daughter Janelda Doland. Will further intervene at that time.  Raina Mina, RN Care Management Coordinator [place] Office [phone]

## 2019-05-12 NOTE — Telephone Encounter (Signed)
Patient daughter, Tamesia called and stated that patient has been complaining about Heart Palpitations and Pain down Left side of leg for about a month. Stated that she has seen the heart Dr. 3 weeks ago. Patient is requesting to see Provider.  Daughter stated that she did not want to take patient to the hospital. Stated that [name] had told her in the past that is was not always necessary to do so.   I scheduled patient an appointment tomorrow with Dinah to be see. Informed daughter to call 911 if any other symptoms occur or if symptoms worsen. She agreed.

## 2019-05-13 ENCOUNTER — Ambulatory Visit: Payer: Self-pay | Admitting: Family

## 2019-05-14 ENCOUNTER — Other Ambulatory Visit: Payer: Self-pay | Admitting: *Deleted

## 2019-05-14 NOTE — Patient Outreach (Signed)
[facility]) Care Management  05/14/2019  DYLANNE HUSA Jan 09, 1930 [id number]    Telephone Assessment-Discipline closure (RN)  RN spoke with pt's daughter [place]) who inquired about placement facilities and progress for a pt with Dementia. RN educated caregiver on memory units and the progress for placement with choosing the facility, interviewing with the admission coordinator, completing an [place] 2 form for facility transition and communicate with the pt's provider during this process. Other discussion around adult day however pt lives alone but has aide services during the day. None available during the night however daughter states her spouse checks on the pt every evening. States she is pretty upset with providers telling her the pt should not be left alone. RN explained the risk for injuries if a dementia pt is left alone however that depends on the stage of her disease but the risk remains if pt has an injury that she is unaware of until the next day if not able to call or report this incident. Offered needed resources for a social work referral with [facility] (receptive). Further inquired on pt's medical issues as far as her HF (daughter states this is managed with no reported issues).  Last fall reported in Nov with no additional incidents. RN offered to enroll pt into prevention measures for ongoing safety both inside and outside the home however daughter indicates pt never leaves the home and is in a safe place. Currently pt complain of a lot of small issues such a eating, taking her medications with the involved aide during the day hours.  Daughter states she her health is compromised pt will need to be placed.   Based upon the caregiver's request will make a referral via [facility] social worker for memory unit placement. RN has encouraged caregiver to obtain a Electronics engineer from a local grocery store with a list of all [place] residential facility for choice. Encourage  caregiver to call make an appointment or inquire on the criteria prior to placement. Verbalized an understanding and remain open to the [facility] social worker resources for this pt. No other inquires or request at this time as caregiver very appreciative and grateful for the information provided.   Plan: Will close this discipline from the case with ongoing involvement from the social worker for [facility] services. Will also alert the provider of pt's disposition with [facility] services. Caregiver aware of how to reach this RN case manager with any concerns or inquires.  Raina Mina, RN Care Management Coordinator [place] Office [phone]

## 2019-05-19 ENCOUNTER — Other Ambulatory Visit: Payer: Self-pay

## 2019-05-19 MED ORDER — FUROSEMIDE 40 MG PO TABS: 40.0000 mg | ORAL_TABLET | Freq: Every day | ORAL | 0 refills | Status: DC

## 2019-05-19 NOTE — Telephone Encounter (Signed)
Patient daughter "[name]" called and states that mother "Ariana [name]" needs a refill on medication "Furosemide" Patient was last seen 04/22/2019 and has upcoming appointment  11/10/2019.

## 2019-05-20 ENCOUNTER — Emergency Department (HOSPITAL_BASED_OUTPATIENT_CLINIC_OR_DEPARTMENT_OTHER): Payer: Medicare Other

## 2019-05-20 ENCOUNTER — Encounter (HOSPITAL_BASED_OUTPATIENT_CLINIC_OR_DEPARTMENT_OTHER): Payer: Self-pay | Admitting: *Deleted

## 2019-05-20 ENCOUNTER — Emergency Department (HOSPITAL_BASED_OUTPATIENT_CLINIC_OR_DEPARTMENT_OTHER)
Admission: EM | Admit: 2019-05-20 | Discharge: 2019-05-20 | Disposition: A | Discharge status: Home / Self Care | Payer: Medicare Other | Attending: Emergency Medicine | Admitting: Emergency Medicine

## 2019-05-20 ENCOUNTER — Other Ambulatory Visit: Payer: Self-pay

## 2019-05-20 DIAGNOSIS — Z888 Allergy status to other drugs, medicaments and biological substances status: Secondary | ICD-10-CM | POA: Insufficient documentation

## 2019-05-20 DIAGNOSIS — Z79899 Other long term (current) drug therapy: Secondary | ICD-10-CM | POA: Insufficient documentation

## 2019-05-20 DIAGNOSIS — E119 Type 2 diabetes mellitus without complications: Secondary | ICD-10-CM | POA: Insufficient documentation

## 2019-05-20 DIAGNOSIS — I10 Essential (primary) hypertension: Secondary | ICD-10-CM | POA: Diagnosis not present

## 2019-05-20 DIAGNOSIS — N3000 Acute cystitis without hematuria: Secondary | ICD-10-CM | POA: Diagnosis not present

## 2019-05-20 DIAGNOSIS — M79604 Pain in right leg: Secondary | ICD-10-CM | POA: Diagnosis present

## 2019-05-20 DIAGNOSIS — Z8673 Personal history of transient ischemic attack (TIA), and cerebral infarction without residual deficits: Secondary | ICD-10-CM | POA: Diagnosis not present

## 2019-05-20 DIAGNOSIS — Z7984 Long term (current) use of oral hypoglycemic drugs: Secondary | ICD-10-CM | POA: Diagnosis not present

## 2019-05-20 DIAGNOSIS — R52 Pain, unspecified: Secondary | ICD-10-CM | POA: Diagnosis not present

## 2019-05-20 DIAGNOSIS — Z880 Allergy status to penicillin: Secondary | ICD-10-CM | POA: Diagnosis not present

## 2019-05-20 DIAGNOSIS — Z7982 Long term (current) use of aspirin: Secondary | ICD-10-CM | POA: Diagnosis not present

## 2019-05-20 DIAGNOSIS — I480 Paroxysmal atrial fibrillation: Secondary | ICD-10-CM | POA: Insufficient documentation

## 2019-05-20 DIAGNOSIS — R1084 Generalized abdominal pain: Secondary | ICD-10-CM | POA: Diagnosis not present

## 2019-05-20 DIAGNOSIS — K5730 Diverticulosis of large intestine without perforation or abscess without bleeding: Secondary | ICD-10-CM | POA: Diagnosis not present

## 2019-05-20 DIAGNOSIS — R202 Paresthesia of skin: Secondary | ICD-10-CM | POA: Diagnosis not present

## 2019-05-20 DIAGNOSIS — M79605 Pain in left leg: Secondary | ICD-10-CM | POA: Insufficient documentation

## 2019-05-20 DIAGNOSIS — Z87891 Personal history of nicotine dependence: Secondary | ICD-10-CM | POA: Diagnosis not present

## 2019-05-20 LAB — CBC WITH DIFFERENTIAL/PLATELET
Abs Immature Granulocytes: 0.03 10*3/uL (ref 0.00–0.07)
Basophils Absolute: 0 10*3/uL (ref 0.0–0.1)
Basophils Relative: 1 %
Eosinophils Absolute: 0.4 10*3/uL (ref 0.0–0.5)
Eosinophils Relative: 6 %
HCT: 40.9 % (ref 36.0–46.0)
Hemoglobin: 13.2 g/dL (ref 12.0–15.0)
Immature Granulocytes: 1 %
Lymphocytes Relative: 29 %
Lymphs Abs: 1.8 10*3/uL (ref 0.7–4.0)
MCH: 31.2 pg (ref 26.0–34.0)
MCHC: 32.3 g/dL (ref 30.0–36.0)
MCV: 96.7 fL (ref 80.0–100.0)
Monocytes Absolute: 0.6 10*3/uL (ref 0.1–1.0)
Monocytes Relative: 10 %
Neutro Abs: 3.3 10*3/uL (ref 1.7–7.7)
Neutrophils Relative %: 53 %
Platelets: 199 10*3/uL (ref 150–400)
RBC: 4.23 MIL/uL (ref 3.87–5.11)
RDW: 13.2 % (ref 11.5–15.5)
WBC: 6.1 10*3/uL (ref 4.0–10.5)
nRBC: 0 % (ref 0.0–0.2)

## 2019-05-20 LAB — LIPASE, BLOOD: Lipase: 40 U/L (ref 11–51)

## 2019-05-20 LAB — COMPREHENSIVE METABOLIC PANEL
ALT: 26 U/L (ref 0–44)
AST: 21 U/L (ref 15–41)
Albumin: 4.1 g/dL (ref 3.5–5.0)
Alkaline Phosphatase: 90 U/L (ref 38–126)
Anion gap: 10 (ref 5–15)
BUN: 17 mg/dL (ref 8–23)
CO2: 26 mmol/L (ref 22–32)
Calcium: 9.5 mg/dL (ref 8.9–10.3)
Chloride: 103 mmol/L (ref 98–111)
Creatinine, Ser: 0.55 mg/dL (ref 0.44–1.00)
GFR calc Af Amer: >60 mL/min (ref >60)
GFR calc non Af Amer: >60 mL/min (ref >60)
Glucose, Bld: 110 mg/dL — ABNORMAL HIGH (ref 70–99)
Potassium: 4.3 mmol/L (ref 3.5–5.1)
Sodium: 139 mmol/L (ref 135–145)
Total Bilirubin: 0.4 mg/dL (ref 0.3–1.2)
Total Protein: 7.4 g/dL (ref 6.5–8.1)

## 2019-05-20 LAB — URINALYSIS, ROUTINE W REFLEX MICROSCOPIC
Bilirubin Urine: NEGATIVE
Glucose, UA: NEGATIVE mg/dL
Hgb urine dipstick: NEGATIVE
Ketones, ur: NEGATIVE mg/dL
Nitrite: POSITIVE — AB
Protein, ur: NEGATIVE mg/dL
Specific Gravity, Urine: 1.01 (ref 1.005–1.030)
pH: 5.5 (ref 5.0–8.0)

## 2019-05-20 LAB — URINALYSIS, MICROSCOPIC (REFLEX)

## 2019-05-20 MED ORDER — SODIUM CHLORIDE 0.9 % IV BOLUS
500.0000 mL | Freq: Once | INTRAVENOUS | Status: AC
  Administered 2019-05-20: 500 mL via INTRAVENOUS

## 2019-05-20 MED ORDER — CEPHALEXIN 500 MG PO CAPS: 500.0000 mg | ORAL_CAPSULE | Freq: Three times a day (TID) | ORAL | 0 refills | Status: AC

## 2019-05-20 MED ORDER — SODIUM CHLORIDE 0.9 % IV SOLN
1.0000 g | Freq: Once | INTRAVENOUS | Status: AC
  Administered 2019-05-20: 1 g via INTRAVENOUS
  Filled 2019-05-20: qty 10

## 2019-05-20 MED ORDER — ACETAMINOPHEN 325 MG PO TABS
650.0000 mg | ORAL_TABLET | Freq: Once | ORAL | Status: AC
  Administered 2019-05-20: 650 mg via ORAL
  Filled 2019-05-20: qty 2

## 2019-05-20 MED ORDER — SODIUM CHLORIDE 0.9 % IV SOLN
INTRAVENOUS | Status: DC | PRN
  Administered 2019-05-20: 250 mL via INTRAVENOUS

## 2019-05-20 MED ORDER — IOHEXOL 300 MG/ML  SOLN
80.0000 mL | Freq: Once | INTRAMUSCULAR | Status: AC | PRN
  Administered 2019-05-20: 80 mL via INTRAVENOUS

## 2019-05-20 NOTE — ED Notes (Signed)
Pt was incontinent for large amount of urine  Pt cleaned and bed changed  Tolerated well

## 2019-05-20 NOTE — Discharge Instructions (Signed)
Please take all of your antibiotics until finished!   Take your antibiotics with food.  Common side effects of antibiotics include nausea, vomiting, abdominal discomfort, and diarrhea. You may help offset some of this with probiotics which you can buy or get in yogurt. Do not eat  or take the probiotics until 2 hours after your antibiotic.    Follow-up with primary care provider for reevaluation of symptoms.  Return to the emergency department if any concerning signs or symptoms develop such as fevers, persistent vomiting, severe uncontrolled pain.

## 2019-05-20 NOTE — ED Provider Notes (Signed)
[facility] EMERGENCY DEPARTMENT Provider Note   CSN: [id number] Arrival date & time: 05/20/19  1438     History Chief Complaint  Patient presents with  . Foot Pain  . Abdominal Pain   Level 5 caveat due to dementia  Ariana [name] is a 86 y.o. female with history of chronic back pain, dementia, diabetes, fibromyalgia, IBS, hypertension, paroxysmal A. fib, physical deconditioning, Sjogren's syndrome, opiate-induced constipation presents for evaluation of acute onset, constant pain to the bilateral lower extremities.  She tells me that she was asleep on a couch and when she awoke she began to notice burning in both feet.  She has a history of peripheral neuropathy.  She tells me she feels numbness and tingling radiating from the low back down the anterior aspect of the right lower extremity.  She denies any recent trauma. Per chart review, the patient takes Lyrica for her known peripheral neuropathy.  I spoke with patient's adoptive goddaughter who is now at the bedside.  She reports that for the last couple of days she has noted the patient's abdomen to be quite protuberant.  She states the patient has a history of "nasty UTIs that require admission to the hospital".  She has noted decreased oral intake.  She states that the patient has a home health aide that stays with her Monday through Thursday and then is at home alone the rest of the week/weekend.  She is unsure when the patient last had a bowel movement.  She is unsure if the patient has had any emesis.  The patient denies any urinary symptoms but she does note that her abdomen feels bloated.  The history is provided by the patient and medical records. The history is limited by the condition of the patient.       Past Medical History:  Diagnosis Date  . Abnormality of gait   . Adenomatous polyp of colon 2002   67mm  . Allergic rhinitis   . Anxiety   . Anxiety and depression   . Chronic back pain   . Dementia without  behavioral disturbance ([place])   . Depression   . Diabetes ([place])   . Diverticulosis of colon   . Dry eye syndrome   . Dysphagia   . Dysthymic disorder   . Fibromyalgia   . GERD (gastroesophageal reflux disease)   . H/O hiatal hernia   . History of adverse drug reaction   . History of cerebrovascular disease 09/24/2014  . History of recurrent UTIs   . Hypertension, benign   . Irritable bowel syndrome   . Low back pain syndrome   . Memory loss   . Mitral valve prolapse   . Paroxysmal A-fib ([place])   . Peripheral neuropathy    "both feet and legs"  . Physical deconditioning   . Sjogren's syndrome ([place])   . Therapeutic opioid-induced constipation (OIC)   . Thyroid nodule   . Urinary incontinence     Patient Active Problem List   Diagnosis Date Noted  . Diabetic neuropathy ([place]) 01/28/2019  . Diabetes mellitus due to underlying condition, uncontrolled ([place]) 09/04/2017  . History of rectal bleeding 09/04/2017  . A-fib ([place]) 04/23/2017  . Diabetes mellitus type 2 in nonobese ([place]) 04/23/2017  . Sjogren's disease ([place]) 04/03/2016  . Primary osteoarthritis of both hands 04/03/2016  . High risk medication use 04/03/2016  . Senile dementia, with behavioral disturbance ([place]) 08/16/2015  . Swelling 08/16/2015  . Diastolic dysfunction 0000000  . Hypertonicity, bladder  07/08/2015  . Arterial hypotension   . Bradycardia 06/29/2015  . Chronic lower back pain 06/29/2015  . PAF (paroxysmal atrial fibrillation) ([place]) 04/18/2015  . Dehydration 04/18/2015  . Dementia without behavioral disturbance ([place]) 04/18/2015  . TIA (transient ischemic attack) 12/17/2014  . Chest pain 12/16/2014  . Acute encephalopathy 12/04/2014  . Fall   . AKI (acute kidney injury) ([place]) 12/03/2014  . History of cerebrovascular disease 09/24/2014  . Falls frequently 07/28/2014  . Infarction of parietal lobe ([place])   . Mild dementia ([place])   . CVA (cerebral infarction) 07/26/2014  . Atrial fibrillation with RVR  ([place]) 07/03/2014  . Constipation 04/15/2014  . Mixed stress and urge urinary incontinence 11/03/2013  . Therapeutic opioid induced constipation 11/03/2013  . Hemorrhoid 11/03/2013  . Low back pain associated with a spinal disorder other than radiculopathy or spinal stenosis 11/03/2013  . Protein-calorie malnutrition, severe ([place]) 07/22/2013  . UTI (lower urinary tract infection) 07/20/2013  . Prolonged QT interval 07/20/2013  . Osteopenia 07/17/2013  . Palpitations 06/30/2013  . Hereditary and idiopathic peripheral neuropathy 05/22/2013  . Abnormality of gait 12/05/2012  . Headache(784.0) 09/05/2012  . Multinodular thyroid 01/15/2012  . Neck pain 01/15/2012  . Hypercholesterolemia 07/08/2010  . Tear film insufficiency 07/06/2009  . [place] SYNDROME 07/06/2009  . Urge urinary incontinence 01/06/2009  . COLONIC POLYPS, ADENOMATOUS, HX OF 04/15/2008  . MITRAL VALVE PROLAPSE 11/05/2007  . ANXIETY DEPRESSION 07/02/2007  . Mononeuritis 07/02/2007  . HYPERTENSION, BENIGN 07/02/2007  . GERD 07/02/2007  . Irritable bowel syndrome 07/02/2007  . Fibromyalgia 07/02/2007    Past Surgical History:  Procedure Laterality Date  . ABDOMINAL HYSTERECTOMY  1967  . APPENDECTOMY    . CARDIAC CATHETERIZATION  02/17/2003   normal L main, LAD free of disease, Cfx free of disease, RCA free of disease (Dr. Loni Muse. Little)  . CATARACT EXTRACTION, BILATERAL    . CHOLECYSTECTOMY  2000  . COLONOSCOPY W/ BIOPSIES     multiple  . DENTAL SURGERY     multiple tooth extractions  . ESOPHAGOGASTRODUODENOSCOPY (EGD) WITH ESOPHAGEAL DILATION N/A 08/23/2012   Procedure: ESOPHAGOGASTRODUODENOSCOPY (EGD) WITH ESOPHAGEAL DILATION;  Surgeon: Milus Banister, MD;  Location: [facility];  Service: Endoscopy;  Laterality: N/A;  . NASAL SEPTUM SURGERY  1980  . NM MYOCAR PERF WALL MOTION  2003   persantine - normal static and dynamic study w/apical thinning and presvered LV function, no ischemia  . SINUS EXPLORATION      ossifiying fibroma  . TEMPOROMANDIBULAR JOINT SURGERY  1986   Dr. Terence Lux  . TRANSTHORACIC ECHOCARDIOGRAM  2001   mild LVH, normal LV     OB History   No obstetric history on file.     Family History  Problem Relation Age of Onset  . Heart disease Father        heart attack  . Pneumonia Father   . Heart attack Mother   . Hypertension Mother   . Colon cancer Sister   . Kidney disease Daughter   . Asthma Daughter   . Arthritis Daughter 19       osteo,  . Heart disease Son 50       stage 3 CHF(Diastolic /Systolic)  . Throat cancer Brother   . Hypertension Maternal Grandmother     Social History   Tobacco Use  . Smoking status: Former Research scientist (life sciences)  . Smokeless tobacco: Never Used  . Tobacco comment: Quit at age 12   Substance Use Topics  . Alcohol use: No  .  Drug use: Never    Home Medications Prior to Admission medications   Medication Sig Start Date End Date Taking? Authorizing Provider  acetaminophen (TYLENOL) 500 MG tablet Take 1,000 mg by mouth 2 (two) times daily.    [provider]  ALPRAZolam Duanne Moron) 0.25 MG tablet Take 1 tablet (0.25 mg total) by mouth 2 (two) times daily as needed. 05/01/19   Reed, Tiffany L, DO  antiseptic oral rinse (BIOTENE) LIQD 15 mLs by Mouth Rinse route 5 (five) times daily as needed for dry mouth.    [provider]  aspirin 325 MG tablet Take 1 tablet (325 mg total) by mouth daily. 07/28/14   Reyne Dumas, MD  Capsaicin-Menthol-Methyl Sal (CAPSAICIN-METHYL SAL-MENTHOL) 0.025-1-12 % CREA Apply 1 application topically 2 (two) times daily. 09/11/18   Ngetich, Dinah C, NP  carboxymethylcellulose (REFRESH TEARS) 0.5 % SOLN Place 1 drop into both eyes 5 (five) times daily as needed (dry eyes).     [provider]  cephALEXin (KEFLEX) 500 MG capsule Take 1 capsule (500 mg total) by mouth 3 (three) times daily for 7 days. 05/20/19 05/27/19  Marielouise Amey A, PA-C  CRANBERRY PO Take 2 tablets by mouth daily.    [provider]  diltiazem (CARDIZEM CD) 180 MG 24 hr capsule Take 1 capsule (180 mg total) by mouth daily. **needs ov** 04/04/19   Hilty, Nadean Corwin, MD  DULoxetine (CYMBALTA) 30 MG capsule Take one capsule by mouth once daily for back pain and for depression. 05/01/19   Reed, Tiffany L, DO  DULoxetine (CYMBALTA) 60 MG capsule Take 1 capsule (60 mg total) by mouth daily. 05/01/19   Reed, Tiffany L, DO  furosemide (LASIX) 40 MG tablet Take 1 tablet (40 mg total) by mouth daily. 05/19/19   Reed, Tiffany L, DO  hydrocortisone (ANUSOL-HC) 2.5 % rectal cream Place 1 application rectally daily. Use for 10 days 12/04/18   Willia Craze, NP  lansoprazole (PREVACID) 30 MG capsule Take one capsule by mouth once daily at noon 03/07/19   Reed, Tiffany L, DO  linaclotide (LINZESS) 290 MCG CAPS capsule TAKE 1 CAPSULE BY MOUTH EVERY DAY AT BEDTIME 02/13/19   Gatha Mayer, MD  memantine (NAMENDA) 10 MG tablet Take 1 tablet (10 mg total) by mouth 2 (two) times daily. 02/24/19   Reed, Tiffany L, DO  mupirocin ointment (BACTROBAN) 2 % Place 1 application into the nose 2 (two) times daily. Right nostril until soreness resolves 02/20/19   Reed, Tiffany L, DO  MYRBETRIQ 50 MG TB24 tablet Take 1 tablet by mouth once daily 01/13/19   Reed, Tiffany L, DO  NIACIN PO Take by mouth daily.    [provider]  nystatin (MYCOSTATIN) 100000 UNIT/ML suspension Take 5 mLs (500,000 Units total) by mouth 4 (four) times daily. Until soreness of mouth resolves 02/20/19   Reed, Tiffany L, DO  ONE TOUCH LANCETS MISC Use to test blood sugar twice daily.  Dx: E11.9 05/15/17   Reed, Tiffany L, DO  ONETOUCH VERIO test strip USE ONE STRIP TO CHECK GLUCOSE TWICE DAILY AS DIRECTED 06/03/18   Reed, Tiffany L, DO  pilocarpine (SALAGEN) 5 MG tablet Take 1 tablet by mouth twice daily 05/01/19   Bo Merino, MD  polyethylene glycol (MIRALAX / GLYCOLAX) packet Take by mouth daily as needed (constipation). Mix 1/2 capful of miralax in 8 oz liquid  and drink    [provider]  potassium chloride SA (KLOR-CON) 20 MEQ tablet Take 1 tablet (  20 mEq total) by mouth daily. 05/01/19   Reed, Tiffany L, DO  pregabalin (LYRICA) 100 MG capsule Take 1 capsule (100 mg total) by mouth 2 (two) times daily. 05/01/19   Reed, Tiffany L, DO  sitaGLIPtin (JANUVIA) 100 MG tablet Take 1 tablet (100 mg total) by mouth daily. 11/08/18   Ngetich, Dinah C, NP    Allergies    Banana, Codeine, Klonopin [clonazepam], Meperidine hcl, Norflex [orphenadrine citrate], Oxycodone-acetaminophen, Propoxyphene hcl, Zoloft [sertraline hcl], Doxycycline, Naproxen, Penicillins, Phenothiazines, Stelazine, Sulfamethoxazole-trimethoprim, Tolectin [tolmetin sodium], and Tramadol  Review of Systems   Review of Systems  Unable to perform ROS: Dementia    Physical Exam Updated Vital Signs BP (!) 145/60   Pulse 62   Temp 98 F (36.7 C) (Oral)   Resp 18   Ht 5\' 3"  (1.6 m)   Wt 69.8 kg   SpO2 97%   BMI 27.26 kg/m   Physical Exam Vitals and nursing note reviewed.  Constitutional:      General: She is not in acute distress.    Appearance: She is well-developed.  HENT:     Head: Normocephalic and atraumatic.  Eyes:     General:        Right eye: No discharge.        Left eye: No discharge.     Extraocular Movements: Extraocular movements intact.     Conjunctiva/sclera: Conjunctivae normal.     Pupils: Pupils are equal, round, and reactive to light.  Neck:     Vascular: No JVD.     Trachea: No tracheal deviation.  Cardiovascular:     Rate and Rhythm: Normal rate.     Pulses: Normal pulses.     Comments: 2+ radial and DP/PT pulses bilaterally, Homans sign absent bilaterally, no lower extremity edema, no palpable cords, compartments are soft  Pulmonary:     Effort: Pulmonary effort is normal.  Abdominal:     General: Abdomen is protuberant. A surgical scar is present. Bowel sounds are increased. There is no distension.     Palpations: Abdomen is soft.      Tenderness: There is generalized abdominal tenderness. There is no right CVA tenderness, left CVA tenderness, guarding or rebound.  Musculoskeletal:     Cervical back: Normal range of motion and neck supple. No tenderness.     Comments: Midline lumbar spine tenderness at around the levels of L4-S1 with left paralumbar muscle tenderness.  No deformity, crepitus, or step-off.  Skin:    General: Skin is warm and dry.     Findings: No erythema.  Neurological:     Mental Status: She is alert.     Comments: She is confused, oriented to person and city.  She knows that [place] holiday recently passed but does not know who the President of the [place] is. No facial droop noted.  Sensation intact to light touch of upper and lower extremities.  Moves all extremities spontaneously without difficulty.  5/5 strength of BLE major muscle groups.  Psychiatric:        Behavior: Behavior normal.     ED Results / Procedures / Treatments   Labs (all labs ordered are listed, but only abnormal results are displayed) Labs Reviewed  COMPREHENSIVE METABOLIC PANEL - Abnormal; Notable for the following components:      Result Value   Glucose, Bld 110 (*)    All other components within normal limits  URINALYSIS, ROUTINE W REFLEX MICROSCOPIC - Abnormal; Notable for the following components:  APPearance CLOUDY (*)    Nitrite POSITIVE (*)    Leukocytes,Ua MODERATE (*)    All other components within normal limits  URINALYSIS, MICROSCOPIC (REFLEX) - Abnormal; Notable for the following components:   Bacteria, UA MANY (*)    All other components within normal limits  URINE CULTURE  CBC WITH DIFFERENTIAL/PLATELET  LIPASE, BLOOD    EKG None  Radiology CT ABDOMEN PELVIS W CONTRAST  Result Date: 05/20/2019 CLINICAL DATA:  Abdominal pain EXAM: CT ABDOMEN AND PELVIS WITH CONTRAST TECHNIQUE: Multidetector CT imaging of the abdomen and pelvis was performed using the standard protocol following bolus  administration of intravenous contrast. CONTRAST:  39mL OMNIPAQUE IOHEXOL 300 MG/ML  SOLN COMPARISON:  03/04/2019 FINDINGS: Lower chest: No acute abnormality. Hepatobiliary: Enhancing lesion noted in the inferior right hepatic lobe. This measures 4.4 cm compared to 3.2 cm previously. Mildly prominent common bile duct is stable since prior study, likely related to post cholecystectomy state. Pancreas: No focal abnormality or ductal dilatation. Spleen: No focal abnormality.  Normal size. Adrenals/Urinary Tract: Small scattered cysts in the left kidney. No suspicious renal or adrenal lesion. No hydronephrosis. Urinary bladder unremarkable. Stomach/Bowel: Sigmoid diverticulosis. No active diverticulitis. Stomach and small bowel grossly unremarkable, decompressed. No evidence of bowel obstruction. Vascular/Lymphatic: No evidence of aneurysm or adenopathy. Reproductive: Prior hysterectomy.  No adnexal masses. Other: No free fluid or free air. Musculoskeletal: No acute bony abnormality. IMPRESSION: Enlarging enhancing lesion in the inferior tip of the right hepatic lobe, now 4.4 cm. When the patient is clinically stable and able to follow directions and hold their breath (preferably as an outpatient) further evaluation with dedicated abdominal MRI should be considered. Left colonic diverticulosis.  No active diverticulitis. Prior cholecystectomy and hysterectomy. No acute findings in the abdomen or pelvis. Electronically Signed   By: Rolm Baptise M.D.   On: 05/20/2019 17:50    Procedures Procedures (including critical care time)  Medications Ordered in ED Medications  sodium chloride 0.9 % bolus 500 mL (0 mLs Intravenous Stopped 05/20/19 1726)  iohexol (OMNIPAQUE) 300 MG/ML solution 80 mL (80 mLs Intravenous Contrast Given 05/20/19 1736)  acetaminophen (TYLENOL) tablet 650 mg (650 mg Oral Given 05/20/19 1904)  cefTRIAXone (ROCEPHIN) 1 g in sodium chloride 0.9 % 100 mL IVPB (0 g Intravenous Stopped 05/20/19 2003)     ED Course  I have reviewed the triage vital signs and the nursing notes.  Pertinent labs & imaging results that were available during my care of the patient were reviewed by me and considered in my medical decision making (see chart for details).    MDM Rules/Calculators/A&P                      Patient presents for evaluation of abdominal pain and abdominal bloating.  She was initially not accompanied and complained of her chronic back pain and peripheral neuropathy on my initial assessment.  From the standpoint she is neurovascularly intact with no red flag signs concerning for cauda equina or spinal abscess.  She was able to void in the ED and is not retaining urine.  She is afebrile, vital signs are stable.  She is nontoxic in appearance.  Her adopted granddaughter came to the bedside and expressed concern for UTI as the patient is prone to these and noticed her abdomen was more bloated today.  She has had no known vomiting or diarrhea or fevers.    Reviewed and interpreted by myself shows no leukocytosis, no anemia, no metabolic derangements,  no renal insufficiency her UA is LFTs and lipase are within normal limits.  Her UA is concerning for UTI.  We will culture this and give her a dose of IV Rocephin in the ED.  She has tolerated Rocephin and cephalosporins in general times previously.   CT scan was obtained which shows no evidence of acute surgical abdominal pathology.  It does show an enlarging enhancing lesion in the inferior tip of the right hepatic lobe now measuring 4.4 cm.  Upon chart review it appears this lesion has been present since at least January of this year when it was noted on CT scan.  Her PCP followed up with outpatient MRI of the abdomen.  Per the patient's granddaughter it was discussed that this lesion is not amenable to biopsy.  I do not feel that it is currently clinically significant as her LFTs are within normal limits and she has no focal right upper quadrant  pain.  I suspect that her symptoms are due to her UTI though really her only symptoms are suprapubic discomfort and abdominal bloating.  Granddaughter does tell me that she has been complaining of some dysuria upon reassessment.   Granddaughter expresses concerns that the patient has recurrent UTIs that are difficult to treat however her last urine culture in our system obtained on 03/04/2019 grew pansensitive E. coli only.  Serial abdominal examinations remain benign and she is tolerating p.o. fluids in the ED.  Granddaughter expressed some frustrations as she feels that she would most likely benefit from admission to the hospital for IV antibiotics.  Given the patient is tolerating p.o., has no renal insufficiency, constitutional symptoms, no evidence of pyelonephritis, or metabolic derangements she does not meet criteria for admission to the hospital which I explained to the granddaughter.  She will follow closely with the PCP and urologist for reevaluation.  Discussed strict ED return precautions.  Patient's granddaughter verbalized understanding of and agreement with plan and patient is stable for discharge at this time.  Patient was seen and evaluated by Dr. Laverta Baltimore who agrees with assessment and plan at this time.   Final Clinical Impression(s) / ED Diagnoses Final diagnoses:  Acute cystitis without hematuria    Rx / DC Orders ED Discharge Orders         Ordered    cephALEXin (KEFLEX) 500 MG capsule  3 times daily     05/20/19 1926           Renita Papa, PA-C 05/21/19 1433    Margette Fast, MD 05/22/19 [phone]

## 2019-05-20 NOTE — ED Notes (Signed)
Bladder volume 290ML

## 2019-05-20 NOTE — ED Triage Notes (Signed)
Pain in both of her feet. Hx of neuropathy. To ED via EMS.

## 2019-05-21 ENCOUNTER — Telehealth: Payer: Self-pay | Admitting: *Deleted

## 2019-05-21 NOTE — Telephone Encounter (Signed)
Patient daughter, Ariana [name] called and stated that patient is in need of an [place]2 form. Stated that Social Services has called her and stated that patients needs FL2 form. Stated that Allegations have been made against her regarding leaving the patient alone at night time. Daughter stated that she is now looking into AL Facilities for placement.  Please Advise.

## 2019-05-21 NOTE — Telephone Encounter (Signed)
FL2 form can certainly be completed.  I have recommended AL for a long time.

## 2019-05-22 NOTE — Telephone Encounter (Signed)
FL2 Form placed in Dr. Cyndi Lennert folder to complete and sign. Daughter is aware and wants to be called once ready for pick up [phone].

## 2019-05-23 LAB — URINE CULTURE: Culture: >=100000 — AB

## 2019-05-23 NOTE — Telephone Encounter (Signed)
I called Ena Frickey (daughter) to let her know that the [facility] 2 forms are ready for pick up.

## 2019-05-24 ENCOUNTER — Telehealth: Payer: Self-pay | Admitting: Emergency Medicine

## 2019-05-24 NOTE — Telephone Encounter (Signed)
Post ED Visit - Positive Culture Follow-up  Culture report reviewed by antimicrobial stewardship pharmacist: [place] Team []  Elenor Quinones, Pharm.D. []  Heide Guile, Pharm.D., BCPS AQ-ID []  Parks Neptune, Pharm.D., BCPS []  Alycia Rossetti, Pharm.D., BCPS []  [place], [place].D., BCPS, AAHIVP []  Legrand Como, Pharm.D., BCPS, AAHIVP []  Salome Arnt, PharmD, BCPS []  Johnnette Gourd, PharmD, BCPS []  Hughes Better, PharmD, BCPS [x]  Duanne Limerick, PharmD []  Laqueta Linden, PharmD, BCPS []  Albertina Parr, PharmD  [place] Team []  Leodis Sias, PharmD []  Lindell Spar, PharmD []  Royetta Asal, PharmD []  Graylin Shiver, Rph []  Rema Fendt) Glennon Mac, PharmD []  Arlyn Dunning, PharmD []  Netta Cedars, PharmD []  Dia Sitter, PharmD []  Leone [place], PharmD []  Gretta Arab, PharmD []  Theodis Shove, PharmD []  Peggyann Juba, PharmD []  Reuel Boom, PharmD   Positive urine culture Treated with Cephalexin, organism sensitive to the same and no further patient follow-up is required at this time.  [name] Melba Araki 05/24/2019, 4:52 PM

## 2019-05-26 ENCOUNTER — Telehealth: Payer: Self-pay | Admitting: *Deleted

## 2019-05-26 NOTE — Telephone Encounter (Signed)
She has overactive bladder longstanding.  She does get recurrent UTIs when she does not hydrate adequately (especially over weekends when alone).  Please ask the caregiver, what are the symptoms she is still having?

## 2019-05-26 NOTE — Telephone Encounter (Signed)
Caregiver calling stating that pt is still having burning with urination, per caregiver she finished the keflex yesterday. I advised that pt should be drinking plenty of water but caregiver states pt always have a UTI. Please advise

## 2019-05-26 NOTE — Telephone Encounter (Signed)
Per caregiver pt is having burning with urination.

## 2019-05-27 ENCOUNTER — Ambulatory Visit: Payer: Medicare Other | Admitting: Podiatry

## 2019-05-27 NOTE — Telephone Encounter (Signed)
Phone just rang, no answering machine and no answer

## 2019-05-27 NOTE — Telephone Encounter (Signed)
Caregiver needs to push fluids.

## 2019-05-28 NOTE — Telephone Encounter (Signed)
Spoke with patient and advised results, pt will continue to drink plenty of fluids

## 2019-05-30 ENCOUNTER — Other Ambulatory Visit: Payer: Self-pay | Admitting: *Deleted

## 2019-05-30 MED ORDER — ALPRAZOLAM 0.25 MG PO TABS: 0.2500 mg | ORAL_TABLET | Freq: Two times a day (BID) | ORAL | 0 refills | Status: DC | PRN

## 2019-05-30 NOTE — Telephone Encounter (Signed)
Patient daughter requested refill [place] Verified LR: 05/01/2019 Pended Rx and sent to [name] for approval.

## 2019-06-02 ENCOUNTER — Other Ambulatory Visit: Payer: Self-pay | Admitting: Internal Medicine

## 2019-06-02 DIAGNOSIS — G6289 Other specified polyneuropathies: Secondary | ICD-10-CM

## 2019-06-02 DIAGNOSIS — M545 Low back pain, unspecified: Secondary | ICD-10-CM

## 2019-06-02 MED ORDER — PREGABALIN 100 MG PO CAPS: 100.0000 mg | ORAL_CAPSULE | Freq: Two times a day (BID) | ORAL | 0 refills | Status: DC

## 2019-06-02 NOTE — Telephone Encounter (Signed)
Approved by [name]. rx sent to pharmacy by e-script

## 2019-06-02 NOTE — Addendum Note (Signed)
Addended by: Despina Hidden on: 06/02/2019 05:03 PM   Modules accepted: Orders

## 2019-06-02 NOTE — Addendum Note (Signed)
Addended by: Tanna Savoy on: 06/02/2019 04:50 PM   Modules accepted: Orders

## 2019-06-02 NOTE — Telephone Encounter (Signed)
rx sent to pharmacy by e-script. Sent to Dr. Reed for approval   

## 2019-06-05 ENCOUNTER — Other Ambulatory Visit: Payer: Self-pay | Admitting: Internal Medicine

## 2019-06-09 ENCOUNTER — Telehealth: Payer: Self-pay | Admitting: Internal Medicine

## 2019-06-09 MED ORDER — DILTIAZEM HCL ER COATED BEADS 180 MG PO CP24: ORAL_CAPSULE | ORAL | 1 refills | Status: DC

## 2019-06-09 NOTE — Telephone Encounter (Signed)
Patient's daughter states that when the pharmacy refilled her mother's Diltiazem medication they told her that she needed an appointment. The daughter just wants to make sure there was no miscommunication because she thought her mother did not need to f/u but once every year. She saw Dr. Debara Pickett on 04/17/19.

## 2019-06-09 NOTE — Telephone Encounter (Signed)
Spoke with daughter and informed that per last OV on 3/4, Dr. Debara Pickett recommended 1 year f/u. Nurse sent in new refill for diltiazem for 1 year supply.

## 2019-06-11 ENCOUNTER — Ambulatory Visit: Payer: Medicare Other | Admitting: Internal Medicine

## 2019-06-12 ENCOUNTER — Encounter: Payer: Self-pay | Admitting: Internal Medicine

## 2019-06-12 ENCOUNTER — Ambulatory Visit (INDEPENDENT_AMBULATORY_CARE_PROVIDER_SITE_OTHER): Payer: Medicare Other | Admitting: Internal Medicine

## 2019-06-12 ENCOUNTER — Other Ambulatory Visit: Payer: Self-pay

## 2019-06-12 VITALS — BP 130/78 | HR 77 | Temp 97.5°F | Ht 63.0 in | Wt 154.2 lb

## 2019-06-12 DIAGNOSIS — J3489 Other specified disorders of nose and nasal sinuses: Secondary | ICD-10-CM | POA: Diagnosis not present

## 2019-06-12 DIAGNOSIS — M3501 Sicca syndrome with keratoconjunctivitis: Secondary | ICD-10-CM

## 2019-06-12 DIAGNOSIS — K582 Mixed irritable bowel syndrome: Secondary | ICD-10-CM | POA: Diagnosis not present

## 2019-06-12 DIAGNOSIS — R160 Hepatomegaly, not elsewhere classified: Secondary | ICD-10-CM

## 2019-06-12 DIAGNOSIS — F0150 Vascular dementia without behavioral disturbance: Secondary | ICD-10-CM

## 2019-06-12 DIAGNOSIS — R410 Disorientation, unspecified: Secondary | ICD-10-CM

## 2019-06-12 MED ORDER — DOCUSATE SODIUM 100 MG PO CAPS: 200.0000 mg | ORAL_CAPSULE | Freq: Every day | ORAL | 11 refills | Status: DC

## 2019-06-12 NOTE — Patient Instructions (Signed)
Bring glucometer by and we'll check and make sure it works.

## 2019-06-12 NOTE — Progress Notes (Signed)
Location:  [facility] clinic Provider: Jerritt Cardoza L. Mariea Clonts, D.O., C.M.D.  Goals of Care:  Advanced Directives 06/12/2019  Does Patient Have a Medical Advance Directive? Yes  Type of Advance Directive [place]  Does patient want to make changes to medical advance directive? No - Patient declined  Copy of [place] in Chart? Yes - validated most recent copy scanned in chart (See row information)  Would patient like information on creating a medical advance directive? -  Pre-existing out of facility DNR order (yellow form or pink MOST form) -     Chief Complaint  Patient presents with  . Acute Visit    Bowel movement and possible UTI    HPI: Patient is a 86 y.o. female seen today for an acute visit for  Says she is having little pellets of stool when she urinates.    She had moderna 3/4 and 4/1.    Still has a sore area in the right lateral nostril.  She cannot   Glucometer is not working right.  Her fingers really have to be milked to get her blood sugar.  CBGs have never been more than 120.    4/6, she had rocephin in the ED for "UTI" and then she was given keflex for it.  It gave her terrible diarrhea.  She's not wiping front to back.    After 5pm, it's not clear if she hydrates, but she has at least 64 oz of water that her caregiver has her drink.    She's had another fall and bruising on her left side/leg.    Past Medical History:  Diagnosis Date  . Abnormality of gait   . Adenomatous polyp of colon 2002   75mm  . Allergic rhinitis   . Anxiety   . Anxiety and depression   . Chronic back pain   . Dementia without behavioral disturbance ([place])   . Depression   . Diabetes ([place])   . Diverticulosis of colon   . Dry eye syndrome   . Dysphagia   . Dysthymic disorder   . Fibromyalgia   . GERD (gastroesophageal reflux disease)   . H/O hiatal hernia   . History of adverse drug reaction   . History of cerebrovascular disease 09/24/2014  .  History of recurrent UTIs   . Hypertension, benign   . Irritable bowel syndrome   . Low back pain syndrome   . Memory loss   . Mitral valve prolapse   . Paroxysmal A-fib ([place])   . Peripheral neuropathy    "both feet and legs"  . Physical deconditioning   . Sjogren's syndrome ([place])   . Therapeutic opioid-induced constipation (OIC)   . Thyroid nodule   . Urinary incontinence     Past Surgical History:  Procedure Laterality Date  . ABDOMINAL HYSTERECTOMY  1967  . APPENDECTOMY    . CARDIAC CATHETERIZATION  02/17/2003   normal L main, LAD free of disease, Cfx free of disease, RCA free of disease (Dr. Loni Muse. [place])  . CATARACT EXTRACTION, BILATERAL    . CHOLECYSTECTOMY  2000  . COLONOSCOPY W/ BIOPSIES     multiple  . DENTAL SURGERY     multiple tooth extractions  . ESOPHAGOGASTRODUODENOSCOPY (EGD) WITH ESOPHAGEAL DILATION N/A 08/23/2012   Procedure: ESOPHAGOGASTRODUODENOSCOPY (EGD) WITH ESOPHAGEAL DILATION;  Surgeon: Milus Banister, MD;  Location: [facility] ENDOSCOPY;  Service: Endoscopy;  Laterality: N/A;  . NASAL SEPTUM SURGERY  1980  . NM MYOCAR PERF WALL MOTION  2003   persantine - normal static and dynamic study w/apical thinning and presvered LV function, no ischemia  . SINUS EXPLORATION     ossifiying fibroma  . TEMPOROMANDIBULAR JOINT SURGERY  1986   Dr. Terence Lux  . TRANSTHORACIC ECHOCARDIOGRAM  2001   mild LVH, normal LV    Allergies  Allergen Reactions  . Banana Nausea And Vomiting  . Codeine Nausea Only    unless given with Phenergan  . Klonopin [Clonazepam] Other (See Comments)    Causes hallucination   . Meperidine Hcl Nausea Only    unless given with Phenergan  . Norflex [Orphenadrine Citrate] Nausea Only    Unless given with Phenergan  . Oxycodone-Acetaminophen Nausea Only    unless given with phenergan  . Propoxyphene Hcl Nausea Only    unless given with phenergan  . Zoloft [Sertraline Hcl] Other (See Comments)    Caused lethargy  . Doxycycline Other (See  Comments)    Unknown reaction  . Naproxen Other (See Comments)    Unknown reaction  . Penicillins Other (See Comments)    Unknown reaction Has patient had a PCN reaction causing immediate rash, facial/tongue/throat swelling, SOB or lightheadedness with hypotension: Unknown Has patient had a PCN reaction causing severe rash involving mucus membranes or skin necrosis: Unknown Has patient had a PCN reaction that required hospitalization: pt was in the hospital at time of reaction Has patient had a PCN reaction occurring within the last 10 years: Unknown If all of the above answers are "NO", then may proceed with Cephalos  . Phenothiazines Other (See Comments)    Unknown reaction  . Stelazine Other (See Comments)    Unknown reaction  . Sulfamethoxazole-Trimethoprim Other (See Comments)    Unknown reaction  . Tolectin [Tolmetin Sodium] Other (See Comments)    Unknown reaction  . Tramadol Other (See Comments)    Unknown reaction    Outpatient Encounter Medications as of 06/12/2019  Medication Sig  . acetaminophen (TYLENOL) 500 MG tablet Take 1,000 mg by mouth 2 (two) times daily.  Marland Kitchen ALPRAZolam (XANAX) 0.25 MG tablet Take 1 tablet (0.25 mg total) by mouth 2 (two) times daily as needed.  Marland Kitchen antiseptic oral rinse (BIOTENE) LIQD 15 mLs by Mouth Rinse route 5 (five) times daily as needed for dry mouth.  Marland Kitchen aspirin 325 MG tablet Take 1 tablet (325 mg total) by mouth daily.  . Capsaicin-Menthol-Methyl Sal (CAPSAICIN-METHYL SAL-MENTHOL) 0.025-1-12 % CREA Apply 1 application topically 2 (two) times daily.  . carboxymethylcellulose (REFRESH TEARS) 0.5 % SOLN Place 1 drop into both eyes 5 (five) times daily as needed (dry eyes).   . CRANBERRY PO Take 2 tablets by mouth daily.  Marland Kitchen diltiazem (CARDIZEM CD) 180 MG 24 hr capsule TAKE 1 CAPSULE BY MOUTH ONCE DAILY  . DULoxetine (CYMBALTA) 30 MG capsule Take one capsule by mouth once daily for back pain and for depression.  . DULoxetine (CYMBALTA) 60 MG  capsule Take 1 capsule (60 mg total) by mouth daily.  . furosemide (LASIX) 40 MG tablet Take 1 tablet (40 mg total) by mouth daily.  . hydrocortisone (ANUSOL-HC) 2.5 % rectal cream Place 1 application rectally daily. Use for 10 days  . lansoprazole (PREVACID) 30 MG capsule Take one capsule by mouth once daily at noon  . linaclotide (LINZESS) 290 MCG CAPS capsule TAKE 1 CAPSULE BY MOUTH EVERY DAY AT BEDTIME  . memantine (NAMENDA) 10 MG tablet Take 1 tablet (10 mg total) by mouth 2 (two) times daily.  . mupirocin  ointment (BACTROBAN) 2 % Place 1 application into the nose 2 (two) times daily. Right nostril until soreness resolves  . MYRBETRIQ 50 MG TB24 tablet Take 1 tablet by mouth once daily  . NIACIN PO Take by mouth daily.  Marland Kitchen nystatin (MYCOSTATIN) 100000 UNIT/ML suspension Take 5 mLs (500,000 Units total) by mouth 4 (four) times daily. Until soreness of mouth resolves  . ONE TOUCH LANCETS MISC Use to test blood sugar twice daily.  Dx: E11.9  . ONETOUCH VERIO test strip USE ONE STRIP TO CHECK GLUCOSE TWICE DAILY AS DIRECTED  . pilocarpine (SALAGEN) 5 MG tablet Take 1 tablet by mouth twice daily  . polyethylene glycol (MIRALAX / GLYCOLAX) packet Take by mouth daily as needed (constipation). Mix 1/2 capful of miralax in 8 oz liquid and drink  . potassium chloride SA (KLOR-CON) 20 MEQ tablet Take 1 tablet (20 mEq total) by mouth daily.  . pregabalin (LYRICA) 100 MG capsule Take 1 capsule (100 mg total) by mouth 2 (two) times daily.  . sitaGLIPtin (JANUVIA) 100 MG tablet Take 1 tablet (100 mg total) by mouth daily.  Marland Kitchen docusate sodium (COLACE) 100 MG capsule Take 2 capsules (200 mg total) by mouth daily.   No facility-administered encounter medications on file as of 06/12/2019.    Review of Systems:  Review of Systems  Constitutional: Positive for malaise/fatigue. Negative for chills and fever.  HENT: Positive for hearing loss. Negative for congestion and sore throat.        Pain in gums; cannot  wear dentures  Eyes: Negative for blurred vision.  Respiratory: Negative for cough and shortness of breath.   Cardiovascular: Negative for chest pain, palpitations and leg swelling.  Gastrointestinal: Positive for constipation. Negative for abdominal pain, blood in stool, diarrhea, heartburn, melena, nausea and vomiting.  Genitourinary:       Difficulty urinating--has bm at same time  Musculoskeletal: Positive for back pain, falls and joint pain.  Skin: Negative for itching and rash.  Neurological: Positive for weakness. Negative for dizziness and loss of consciousness.       Ambulates with walker  Endo/Heme/Allergies: Bruises/bleeds easily.  Psychiatric/Behavioral: Positive for depression and memory loss. The patient is nervous/anxious. The patient does not have insomnia.     Health Maintenance  Topic Date Due  . OPHTHALMOLOGY EXAM  Never done  . HEMOGLOBIN A1C  09/05/2018  . MAMMOGRAM  09/12/2019  . INFLUENZA VACCINE  09/14/2019  . FOOT EXAM  01/28/2020  . TETANUS/TDAP  12/09/2023  . DEXA SCAN  Completed  . COVID-19 Vaccine  Completed  . PNA vac Low Risk Adult  Completed  . URINE MICROALBUMIN  Discontinued    Physical Exam: Vitals:   06/12/19 1431  BP: 130/78  Pulse: 77  Temp: (!) 97.5 F (36.4 C)  TempSrc: Temporal  SpO2: 94%  Weight: 154 lb 3.2 oz (69.9 kg)  Height: 5\' 3"  (1.6 m)   Body mass index is 27.32 kg/m. Physical Exam Vitals reviewed.  Constitutional:      General: She is not in acute distress.    Appearance: Normal appearance. She is normal weight. She is not ill-appearing or toxic-appearing.  HENT:     Head: Normocephalic and atraumatic.  Cardiovascular:     Rate and Rhythm: Rhythm irregular.     Heart sounds: No murmur.  Pulmonary:     Effort: Pulmonary effort is normal.     Breath sounds: Normal breath sounds. No wheezing, rhonchi or rales.  Abdominal:     General:  Bowel sounds are normal. There is distension.     Palpations: Abdomen is soft.  There is no mass.     Tenderness: There is no abdominal tenderness. There is no guarding or rebound.  Musculoskeletal:        General: Normal range of motion.     Right lower leg: No edema.     Left lower leg: No edema.  Skin:    General: Skin is warm and dry.     Comments: Ecchymoses on left hip area from fall  Neurological:     General: No focal deficit present.     Mental Status: She is alert.     Gait: Gait abnormal.     Comments: Walks with walker; more confused than last visit--unable to give coherent story--keeps losing train of though worse than baseline  Psychiatric:     Comments: Irritable and not being nice to her caregiver     Labs reviewed: Basic Metabolic Panel: Recent Labs    12/13/18 1048 03/04/19 1505 05/20/19 1646  NA 140 138 139  K 3.8 4.1 4.3  CL 105 102 103  CO2 27 26 26   GLUCOSE 120* 118* 110*  BUN 27* 25* 17  CREATININE 0.72 0.78 0.55  CALCIUM 9.6 9.9 9.5   Liver Function Tests: Recent Labs    12/04/18 1245 03/04/19 1505 05/20/19 1646  AST 17 24 21   ALT 16 22 26   ALKPHOS 77 89 90  BILITOT 0.4 0.7 0.4  PROT 7.6 8.1 7.4  ALBUMIN 4.4 4.5 4.1   Recent Labs    03/04/19 1505 05/20/19 1646  LIPASE 32 40   No results for input(s): AMMONIA in the last 8760 hours. CBC: Recent Labs    12/13/18 1048 03/04/19 1505 05/20/19 1646  WBC 8.0 7.0 6.1  NEUTROABS 5.4 4.0 3.3  HGB 13.4 13.7 13.2  HCT 40.9 42.2 40.9  MCV 96.2 96.1 96.7  PLT 211 207 199   Lipid Panel: No results for input(s): CHOL, HDL, LDLCALC, TRIG, CHOLHDL, LDLDIRECT in the last 8760 hours. Lab Results  Component Value Date   HGBA1C 6.0 (H) 03/07/2018    Procedures since last visit: CT ABDOMEN PELVIS W CONTRAST  Result Date: 05/20/2019 CLINICAL DATA:  Abdominal pain EXAM: CT ABDOMEN AND PELVIS WITH CONTRAST TECHNIQUE: Multidetector CT imaging of the abdomen and pelvis was performed using the standard protocol following bolus administration of intravenous contrast.  CONTRAST:  106mL OMNIPAQUE IOHEXOL 300 MG/ML  SOLN COMPARISON:  03/04/2019 FINDINGS: Lower chest: No acute abnormality. Hepatobiliary: Enhancing lesion noted in the inferior right hepatic lobe. This measures 4.4 cm compared to 3.2 cm previously. Mildly prominent common bile duct is stable since prior study, likely related to post cholecystectomy state. Pancreas: No focal abnormality or ductal dilatation. Spleen: No focal abnormality.  Normal size. Adrenals/Urinary Tract: Small scattered cysts in the left kidney. No suspicious renal or adrenal lesion. No hydronephrosis. Urinary bladder unremarkable. Stomach/Bowel: Sigmoid diverticulosis. No active diverticulitis. Stomach and small bowel grossly unremarkable, decompressed. No evidence of bowel obstruction. Vascular/Lymphatic: No evidence of aneurysm or adenopathy. Reproductive: Prior hysterectomy.  No adnexal masses. Other: No free fluid or free air. Musculoskeletal: No acute bony abnormality. IMPRESSION: Enlarging enhancing lesion in the inferior tip of the right hepatic lobe, now 4.4 cm. When the patient is clinically stable and able to follow directions and hold their breath (preferably as an outpatient) further evaluation with dedicated abdominal MRI should be considered. Left colonic diverticulosis.  No active diverticulitis. Prior cholecystectomy and hysterectomy. No  acute findings in the abdomen or pelvis. Electronically Signed   By: Rolm Baptise M.D.   On: 05/20/2019 17:50    Assessment/Plan 1. Delirium - mentation not at baseline--challenging to tell if due to her bipolar vs her delirium or simply progression of dementia - she is not allowing her caregiver to help her in the bathroom and therefor not wiping herself properly front to back and had some diarrhea after last abx for "UTI" in ED 3 wks ago which did grow E coli probably more due to poor hygiene with colonization than true infection - Urinalysis, Routine w reflex microscopic - Urine  Culture  2. Vascular dementia without behavioral disturbance ([place]) -continues to require 24x7 care, but not receiving in home environment--just has daytime care M-F and alone on weekends except stops from son in law I'm told who brings her pills and some food  3. Irritable bowel syndrome with both constipation and diarrhea - ongoing--having more constipation at this time which seems to be affecting her ability to urinate normally and she describes pellet stools--not allowing caregiver to help her so she does not know for sure what's going on either  - docusate sodium (COLACE) 100 MG capsule; Take 2 capsules (200 mg total) by mouth daily.  Dispense: 60 capsule; Refill: 11  4. Nose pain -still sore in right nostril--reordered bactroban (really any otc antibiotic ointment would be fine)--I don't see the inflamed follicle/pimple there that was there mos ago when she first had this complaint and ENT did not see anything per caregiver  5. Sjogren's syndrome with keratoconjunctivitis sicca (HCC) -remains main cause of gum pain and stickiness she reports, followed by rheum -cont biotene, hydration  6. Liver mass -discussed with her daughter as patient does not recall minute to minute accurately with extensive phone call when results of imaging returned and symptoms are in lower abdomen and seem unrelated to this incidental finding  -she is not a candidate for biopsy and treatment interventions with her dementia with need for assistance with toileting, meals, unsteady gait and need for at least AL placement with some additional caregiver assistance -reportedly Caelie is working on placement again with some assistance after [facility] had been called   Labs/tests ordered:   Lab Orders     Urine Culture     MICROSCOPIC MESSAGE     Urinalysis, Routine w reflex microscopic  Next appt:  11/14/2019  Denni France L. Grady Mohabir, D.O. [facility] [address],  [place] [address] Cell Phone (Mon-Fri 8am-5pm):  [phone] On Call:  [phone] & follow prompts after 5pm & weekends Office Phone:  [phone] Office Fax:  [phone]

## 2019-06-13 MED ORDER — MUPIROCIN 2 % EX OINT: 1.0000 "application " | TOPICAL_OINTMENT | Freq: Two times a day (BID) | CUTANEOUS | 0 refills | Status: DC

## 2019-06-14 LAB — URINALYSIS, ROUTINE W REFLEX MICROSCOPIC
Bilirubin Urine: NEGATIVE
Glucose, UA: NEGATIVE
Hgb urine dipstick: NEGATIVE
Hyaline Cast: NONE SEEN /LPF
Ketones, ur: NEGATIVE
Nitrite: NEGATIVE
Protein, ur: NEGATIVE
RBC / HPF: NONE SEEN /HPF (ref 0–2)
Specific Gravity, Urine: 1.009 (ref 1.001–1.03)
Squamous Epithelial / HPF: NONE SEEN /HPF (ref <=5)
pH: <=5 (ref 5.0–8.0)

## 2019-06-14 LAB — URINE CULTURE
MICRO NUMBER:: 10422625
SPECIMEN QUALITY:: ADEQUATE

## 2019-06-16 ENCOUNTER — Other Ambulatory Visit: Payer: Self-pay

## 2019-06-16 MED ORDER — SACCHAROMYCES BOULARDII 250 MG PO CAPS: 250.0000 mg | ORAL_CAPSULE | Freq: Two times a day (BID) | ORAL | 0 refills | Status: DC

## 2019-06-16 MED ORDER — CIPROFLOXACIN HCL 500 MG PO TABS: 500.0000 mg | ORAL_TABLET | Freq: Two times a day (BID) | ORAL | 0 refills | Status: DC

## 2019-06-16 NOTE — Telephone Encounter (Signed)
PER: Ngetich, Dinah C, NP  [facility] Clinical Pool  Final urine culture indicates > 100,000 colonies of E.Coli start on cipro 500 mg tablet one by mouth twice daily x 7 days along with Florastor 250 mg capsule one by mouth twice daily x 10 days.     Medications pend and sent to provider Ngetich, Dinah, NP to associate medications with Diagnosis. Please Advise.

## 2019-06-17 ENCOUNTER — Other Ambulatory Visit: Payer: Self-pay

## 2019-06-17 ENCOUNTER — Ambulatory Visit: Payer: Medicare Other | Admitting: *Deleted

## 2019-06-17 DIAGNOSIS — E0865 Diabetes mellitus due to underlying condition with hyperglycemia: Secondary | ICD-10-CM

## 2019-06-17 NOTE — Progress Notes (Signed)
Patient was having trouble with her lancet device. Instructed patient and caregiver on how to use it.  Patient and Caregiver understood and agreed.

## 2019-06-30 ENCOUNTER — Other Ambulatory Visit: Payer: Self-pay | Admitting: Internal Medicine

## 2019-06-30 NOTE — Telephone Encounter (Signed)
Last filled 05/30/19, #90.  Last OV 06/12/19.

## 2019-07-11 ENCOUNTER — Ambulatory Visit (HOSPITAL_COMMUNITY)
Admission: EM | Admit: 2019-07-11 | Discharge: 2019-07-11 | Disposition: A | Discharge status: Home / Self Care | Payer: Medicare Other | Attending: Family Medicine | Admitting: Family Medicine

## 2019-07-11 ENCOUNTER — Other Ambulatory Visit: Payer: Self-pay | Admitting: Internal Medicine

## 2019-07-11 ENCOUNTER — Ambulatory Visit (INDEPENDENT_AMBULATORY_CARE_PROVIDER_SITE_OTHER): Payer: Medicare Other

## 2019-07-11 ENCOUNTER — Encounter (HOSPITAL_COMMUNITY): Payer: Self-pay

## 2019-07-11 ENCOUNTER — Other Ambulatory Visit: Payer: Self-pay

## 2019-07-11 DIAGNOSIS — M25512 Pain in left shoulder: Secondary | ICD-10-CM | POA: Diagnosis not present

## 2019-07-11 DIAGNOSIS — Y92009 Unspecified place in unspecified non-institutional (private) residence as the place of occurrence of the external cause: Secondary | ICD-10-CM | POA: Diagnosis not present

## 2019-07-11 DIAGNOSIS — S63501A Unspecified sprain of right wrist, initial encounter: Secondary | ICD-10-CM | POA: Diagnosis not present

## 2019-07-11 DIAGNOSIS — M25531 Pain in right wrist: Secondary | ICD-10-CM | POA: Diagnosis not present

## 2019-07-11 DIAGNOSIS — W19XXXA Unspecified fall, initial encounter: Secondary | ICD-10-CM

## 2019-07-11 DIAGNOSIS — S4992XA Unspecified injury of left shoulder and upper arm, initial encounter: Secondary | ICD-10-CM | POA: Diagnosis not present

## 2019-07-11 NOTE — ED Provider Notes (Signed)
[place]    CSN: [id number] Arrival date & time: 07/11/19  1338      History   Chief Complaint Chief Complaint  Patient presents with  . Fall  . Wrist Pain    HPI Ariana [name] is a 86 y.o. female.   HPI   Pleasant 86 year old woman who lives alone.  She does have a caregiver.  She states that she fell in her home on Sunday, 5 days ago.  She fell forward.  Mother outstretched hands.  Has injury to her right wrist and her left shoulder. She was seen by son-in-law who is a former EMT.  He felt that she "checked out" and did not need to get medical care. She has a new caregiver that started with her today.  Woman came and met Ariana [name], and as she was getting to know her noticed that her arms had limited movement.  She recommended that patient come in and be evaluated for injuries related to her fall. Patient states that she did not faint or have any dizziness that caused her fall, she simply slipped on wet pine needles.  She did not hit her head or lose consciousness.  She denies any other injury from the fall. She is under the care of geriatric medicine and is compliant with her medications.  Immunizations up-to-date  Past Medical History:  Diagnosis Date  . Abnormality of gait   . Adenomatous polyp of colon 2002   53m  . Allergic rhinitis   . Anxiety   . Anxiety and depression   . Chronic back pain   . Dementia without behavioral disturbance ([place]   . Depression   . Diabetes ([place]   . Diverticulosis of colon   . Dry eye syndrome   . Dysphagia   . Dysthymic disorder   . Fibromyalgia   . GERD (gastroesophageal reflux disease)   . H/O hiatal hernia   . History of adverse drug reaction   . History of cerebrovascular disease 09/24/2014  . History of recurrent UTIs   . Hypertension, benign   . Irritable bowel syndrome   . Low back pain syndrome   . Memory loss   . Mitral valve prolapse   . Paroxysmal A-fib (HN[place]   . Peripheral neuropathy    "both  feet and legs"  . Physical deconditioning   . Sjogren's syndrome ([place]   . Therapeutic opioid-induced constipation (OIC)   . Thyroid nodule   . Urinary incontinence     Patient Active Problem List   Diagnosis Date Noted  . Diabetic neuropathy ([place] 01/28/2019  . Diabetes mellitus due to underlying condition, uncontrolled ([place] 09/04/2017  . History of rectal bleeding 09/04/2017  . A-fib ([place] 04/23/2017  . Diabetes mellitus type 2 in nonobese ([place] 04/23/2017  . Sjogren's disease ([place] 04/03/2016  . Primary osteoarthritis of both hands 04/03/2016  . High risk medication use 04/03/2016  . Senile dementia, with behavioral disturbance (HS[place] Mateo 08/16/2015  . Swelling 08/16/2015  . Diastolic dysfunction 069/67/8938 . Hypertonicity, bladder 07/08/2015  . Arterial hypotension   . Bradycardia 06/29/2015  . Chronic lower back pain 06/29/2015  . PAF (paroxysmal atrial fibrillation) ([place] 04/18/2015  . Dehydration 04/18/2015  . Dementia without behavioral disturbance (HF[place] 04/18/2015  . TIA (transient ischemic attack) 12/17/2014  . Chest pain 12/16/2014  . Acute encephalopathy 12/04/2014  . Fall   . AKI (acute kidney injury) (HSalin[place] 12/03/2014  . History of cerebrovascular disease 09/24/2014  . Falls frequently 07/28/2014  .  Infarction of parietal lobe ([place])   . Mild dementia ([place])   . CVA (cerebral infarction) 07/26/2014  . Atrial fibrillation with RVR ([place]) 07/03/2014  . Constipation 04/15/2014  . Mixed stress and urge urinary incontinence 11/03/2013  . Therapeutic opioid induced constipation 11/03/2013  . Hemorrhoid 11/03/2013  . Low back pain associated with a spinal disorder other than radiculopathy or spinal stenosis 11/03/2013  . Protein-calorie malnutrition, severe ([place]) 07/22/2013  . UTI (lower urinary tract infection) 07/20/2013  . Prolonged QT interval 07/20/2013  . Osteopenia 07/17/2013  . Palpitations 06/30/2013  . Hereditary and idiopathic peripheral neuropathy  05/22/2013  . Abnormality of gait 12/05/2012  . Headache(784.0) 09/05/2012  . Multinodular thyroid 01/15/2012  . Neck pain 01/15/2012  . Hypercholesterolemia 07/08/2010  . Tear film insufficiency 07/06/2009  . [place] SYNDROME 07/06/2009  . Urge urinary incontinence 01/06/2009  . COLONIC POLYPS, ADENOMATOUS, HX OF 04/15/2008  . MITRAL VALVE PROLAPSE 11/05/2007  . ANXIETY DEPRESSION 07/02/2007  . Mononeuritis 07/02/2007  . HYPERTENSION, BENIGN 07/02/2007  . GERD 07/02/2007  . Irritable bowel syndrome 07/02/2007  . Fibromyalgia 07/02/2007    Past Surgical History:  Procedure Laterality Date  . ABDOMINAL HYSTERECTOMY  1967  . APPENDECTOMY    . CARDIAC CATHETERIZATION  02/17/2003   normal L main, LAD free of disease, Cfx free of disease, RCA free of disease (Dr. Loni Muse. [place])  . CATARACT EXTRACTION, BILATERAL    . CHOLECYSTECTOMY  2000  . COLONOSCOPY W/ BIOPSIES     multiple  . DENTAL SURGERY     multiple tooth extractions  . ESOPHAGOGASTRODUODENOSCOPY (EGD) WITH ESOPHAGEAL DILATION N/A 08/23/2012   Procedure: ESOPHAGOGASTRODUODENOSCOPY (EGD) WITH ESOPHAGEAL DILATION;  Surgeon: Milus Banister, MD;  Location: [facility];  Service: Endoscopy;  Laterality: N/A;  . NASAL SEPTUM SURGERY  1980  . NM MYOCAR PERF WALL MOTION  2003   persantine - normal static and dynamic study w/apical thinning and presvered LV function, no ischemia  . SINUS EXPLORATION     ossifiying fibroma  . TEMPOROMANDIBULAR JOINT SURGERY  1986   Dr. Terence Lux  . TRANSTHORACIC ECHOCARDIOGRAM  2001   mild LVH, normal LV    OB History   No obstetric history on file.      Home Medications    Prior to Admission medications   Medication Sig Start Date End Date Taking? Authorizing Provider  aspirin 325 MG tablet Take 1 tablet (325 mg total) by mouth daily. 07/28/14  Yes Reyne Dumas, MD  diltiazem (CARDIZEM CD) 180 MG 24 hr capsule TAKE 1 CAPSULE BY MOUTH ONCE DAILY 06/09/19  Yes Hilty, Nadean Corwin, MD   DULoxetine (CYMBALTA) 60 MG capsule Take 1 capsule (60 mg total) by mouth daily. 05/01/19  Yes Reed, Tiffany L, DO  furosemide (LASIX) 40 MG tablet Take 1 tablet (40 mg total) by mouth daily. 05/19/19  Yes Reed, Tiffany L, DO  lansoprazole (PREVACID) 30 MG capsule Take one capsule by mouth once daily at noon 03/07/19  Yes Reed, Tiffany L, DO  MYRBETRIQ 50 MG TB24 tablet Take 1 tablet by mouth once daily 07/11/19  Yes Reed, Tiffany L, DO  acetaminophen (TYLENOL) 500 MG tablet Take 1,000 mg by mouth 2 (two) times daily.    [provider]  ALPRAZolam Duanne Moron) 0.25 MG tablet Take 1 tablet by mouth twice daily as needed 06/30/19   Mariea Clonts, Tiffany L, DO  antiseptic oral rinse (BIOTENE) LIQD 15 mLs by Mouth Rinse route 5 (five) times daily as needed for dry mouth.  [provider]  Capsaicin-Menthol-Methyl Sal (CAPSAICIN-METHYL SAL-MENTHOL) 0.025-1-12 % CREA Apply 1 application topically 2 (two) times daily. 09/11/18   Ngetich, Dinah C, NP  carboxymethylcellulose (REFRESH TEARS) 0.5 % SOLN Place 1 drop into both eyes 5 (five) times daily as needed (dry eyes).     [provider]  ciprofloxacin (CIPRO) 500 MG tablet Take 1 tablet (500 mg total) by mouth 2 (two) times daily. 06/16/19   Ngetich, Dinah C, NP  CRANBERRY PO Take 2 tablets by mouth daily.    [provider]  docusate sodium (COLACE) 100 MG capsule Take 2 capsules (200 mg total) by mouth daily. 06/12/19   Reed, Tiffany L, DO  DULoxetine (CYMBALTA) 30 MG capsule Take one capsule by mouth once daily for back pain and for depression. 05/01/19   Reed, Tiffany L, DO  hydrocortisone (ANUSOL-HC) 2.5 % rectal cream Place 1 application rectally daily. Use for 10 days 12/04/18   Willia Craze, NP  linaclotide [facility]) 290 MCG CAPS capsule TAKE 1 CAPSULE BY MOUTH EVERY DAY AT BEDTIME 02/13/19   Gatha Mayer, MD  memantine (NAMENDA) 10 MG tablet Take 1 tablet (10 mg total) by mouth 2 (two) times daily. 02/24/19   Reed, Tiffany  L, DO  mupirocin ointment (BACTROBAN) 2 % Place 1 application into the nose 2 (two) times daily. Right nostril until soreness resolves 06/13/19   Reed, Tiffany L, DO  NIACIN PO Take by mouth daily.    [provider]  nystatin (MYCOSTATIN) 100000 UNIT/ML suspension Take 5 mLs (500,000 Units total) by mouth 4 (four) times daily. Until soreness of mouth resolves 02/20/19   Reed, Tiffany L, DO  ONE TOUCH LANCETS MISC Use to test blood sugar twice daily.  Dx: E11.9 05/15/17   Reed, Tiffany L, DO  ONETOUCH VERIO test strip USE ONE STRIP TO CHECK GLUCOSE TWICE DAILY AS DIRECTED 06/03/18   Reed, Tiffany L, DO  pilocarpine (SALAGEN) 5 MG tablet Take 1 tablet by mouth twice daily 05/01/19   Bo Merino, MD  polyethylene glycol (MIRALAX / GLYCOLAX) packet Take by mouth daily as needed (constipation). Mix 1/2 capful of miralax in 8 oz liquid and drink    [provider]  potassium chloride SA (KLOR-CON) 20 MEQ tablet Take 1 tablet (20 mEq total) by mouth daily. 05/01/19   Reed, Tiffany L, DO  pregabalin (LYRICA) 100 MG capsule Take 1 capsule (100 mg total) by mouth 2 (two) times daily. 06/02/19   Reed, Tiffany L, DO  saccharomyces boulardii (FLORASTOR) 250 MG capsule Take 1 capsule (250 mg total) by mouth 2 (two) times daily. 06/16/19   Ngetich, Dinah C, NP  sitaGLIPtin (JANUVIA) 100 MG tablet Take 1 tablet (100 mg total) by mouth daily. 11/08/18   Ngetich, Nelda Bucks, NP    Family History Family History  Problem Relation Age of Onset  . Heart disease Father        heart attack  . Pneumonia Father   . Heart attack Mother   . Hypertension Mother   . Colon cancer Sister   . Kidney disease Daughter   . Asthma Daughter   . Arthritis Daughter 80       osteo,  . Heart disease Son 63       stage 3 CHF(Diastolic /Systolic)  . Throat cancer Brother   . Hypertension Maternal Grandmother     Social History Social History   Tobacco Use  . Smoking status: Former Research scientist (life sciences)  . Smokeless tobacco:  Never  Used  . Tobacco comment: Quit at age 16   Substance Use Topics  . Alcohol use: No  . Drug use: Never     Allergies   Banana, Codeine, Klonopin [clonazepam], Meperidine hcl, Norflex [orphenadrine citrate], Oxycodone-acetaminophen, Propoxyphene hcl, Zoloft [sertraline hcl], Doxycycline, Naproxen, Penicillins, Phenothiazines, Stelazine, Sulfamethoxazole-trimethoprim, Tolectin [tolmetin sodium], and Tramadol   Review of Systems Review of Systems  Musculoskeletal: Positive for arthralgias.     Physical Exam Triage Vital Signs ED Triage Vitals  Enc Vitals Group     BP 07/11/19 1424 121/74     Pulse Rate 07/11/19 1424 71     Resp 07/11/19 1424 20     Temp 07/11/19 1424 98 F (36.7 C)     Temp Source 07/11/19 1424 Oral     SpO2 07/11/19 1424 95 %     Weight --      Height --      Head Circumference --      Peak Flow --      Pain Score 07/11/19 1422 8     Pain Loc --      Pain Edu? --      Excl. in [place]? --    No data found.  Updated Vital Signs BP 121/74 (BP Location: Left Arm)   Pulse 71   Temp 98 F (36.7 C) (Oral)   Resp 20   SpO2 95%       Physical Exam Constitutional:      General: She is not in acute distress.    Appearance: Normal appearance. She is well-developed and normal weight.  HENT:     Head: Normocephalic and atraumatic.     Mouth/Throat:     Comments: mask Eyes:     Conjunctiva/sclera: Conjunctivae normal.     Pupils: Pupils are equal, round, and reactive to light.  Cardiovascular:     Rate and Rhythm: Normal rate and regular rhythm.  Pulmonary:     Effort: Pulmonary effort is normal. No respiratory distress.  Musculoskeletal:        General: Normal range of motion.     Cervical back: Normal range of motion.     Comments: There is tenderness over the distal radius on the right, and at the base of the thumb.  Mild soft tissue swelling.  No discoloration.  Normal movement of the fingers with good grip.  There is limited motion of the left  shoulder.  She can abduct only 80 degrees and with the elbow tucked close to the body cannot externally rotate.  Distal neurovascular is intact  Skin:    General: Skin is warm and dry.  Neurological:     Mental Status: She is alert.      UC Treatments / Results  Labs (all labs ordered are listed, but only abnormal results are displayed) Labs Reviewed - No data to display  EKG   Radiology DG Wrist Complete Right  Result Date: 07/11/2019 CLINICAL DATA:  86 year old female with fall and right wrist pain. EXAM: RIGHT WRIST - COMPLETE 3+ VIEW COMPARISON:  None. FINDINGS: There is no acute fracture or dislocation. The bones are osteopenic. There are degenerative changes of the first-third MCP joints as well as arthritic changes of the base of the thumb. Mild soft tissue swelling of the wrist. No radiopaque foreign object or soft tissue gas. IMPRESSION: 1. No acute fracture or dislocation. 2. Osteopenia with degenerative changes. Electronically Signed   By: Anner Crete M.D.   On: 07/11/2019 15:43   DG  Shoulder Left  Result Date: 07/11/2019 CLINICAL DATA:  Pain status post fall EXAM: LEFT SHOULDER - 2+ VIEW COMPARISON:  12/13/2018 FINDINGS: There is no evidence of fracture or dislocation. There is no evidence of arthropathy or other focal bone abnormality. Soft tissues are unremarkable. IMPRESSION: Negative. Electronically Signed   By: Constance Holster M.D.   On: 07/11/2019 15:40    Procedures Procedures (including critical care time)  Medications Ordered in UC Medications - No data to display  Initial Impression / Assessment and Plan / UC Course  I have reviewed the triage vital signs and the nursing notes.  Pertinent labs & imaging results that were available during my care of the patient were reviewed by me and considered in my medical decision making (see chart for details).     To my reading the distal radius has some mild irregularity and callus formation that could  indicate a nondisplaced fracture.  Okay to put her in a brace and have asked her to wear this for the next 4 to 6 weeks.  May remove when wrist is pain-free.  Shoulder range of motion exercises demonstrated. Final Clinical Impressions(s) / UC Diagnoses   Final diagnoses:  Sprain of right wrist, initial encounter  Acute pain of left shoulder  Fall in home, initial encounter     Discharge Instructions     Brace is for comfort May discontinue when pain allows Activity as tolerated Tylenol if needed for pain    ED Prescriptions    None     PDMP not reviewed this encounter.   Raylene Everts, MD 07/11/19 [phone]

## 2019-07-11 NOTE — Discharge Instructions (Signed)
Brace is for comfort May discontinue when pain allows Activity as tolerated Tylenol if needed for pain

## 2019-07-11 NOTE — ED Triage Notes (Signed)
Pt c/o fall on Sunday when she slipped on wet pine needles in her yard. Denies syncope or LOC or head injury. Pt c/o right wrist, forearm, hand and left shoulder.  Right thumb/hand/lateral wrist area with swelling.

## 2019-07-23 ENCOUNTER — Other Ambulatory Visit: Payer: Self-pay

## 2019-07-23 ENCOUNTER — Ambulatory Visit (INDEPENDENT_AMBULATORY_CARE_PROVIDER_SITE_OTHER): Payer: Medicare Other | Admitting: Family

## 2019-07-23 ENCOUNTER — Encounter: Payer: Self-pay | Admitting: Family

## 2019-07-23 VITALS — BP 132/78 | HR 76 | Temp 96.6°F | Resp 16 | Ht 63.0 in | Wt 153.8 lb

## 2019-07-23 DIAGNOSIS — M25512 Pain in left shoulder: Secondary | ICD-10-CM | POA: Diagnosis not present

## 2019-07-23 DIAGNOSIS — R399 Unspecified symptoms and signs involving the genitourinary system: Secondary | ICD-10-CM

## 2019-07-23 DIAGNOSIS — M25531 Pain in right wrist: Secondary | ICD-10-CM

## 2019-07-23 LAB — POCT URINALYSIS DIPSTICK
Bilirubin, UA: NEGATIVE
Blood, UA: NEGATIVE
Glucose, UA: NEGATIVE
Ketones, UA: NEGATIVE
Leukocytes, UA: NEGATIVE
Nitrite, UA: NEGATIVE
Protein, UA: NEGATIVE
Spec Grav, UA: 1.02 (ref 1.010–1.025)
Urobilinogen, UA: NEGATIVE E.U./dL — AB
pH, UA: 5 (ref 5.0–8.0)

## 2019-07-23 NOTE — Progress Notes (Signed)
Provider: Kerrie Latour FNP-C  Gayland Curry, DO  Patient Care Team: Gayland Curry, DO as PCP - General (Geriatric Medicine) Noralee Space, MD as Consulting Physician (Pulmonary Disease) Maisie Fus, MD as Consulting Physician (Obstetrics and Gynecology) Debara Pickett Nadean Corwin, MD as Consulting Physician (Cardiology) Monna Fam, MD as Consulting Physician (Ophthalmology) Kathrynn Ducking, MD as Consulting Physician (Neurology) Franchot Gallo, MD as Consulting Physician (Urology) Lavonna Monarch, MD as Consulting Physician (Dermatology) Gatha Mayer, MD as Consulting Physician (Gastroenterology) Estill Dooms as Social Worker  Extended Emergency Contact Information Primary Emergency Contact: Era Skeen Address: [address]          [place], [place] [address] Johnnette Litter of [place] Phone: [phone] Mobile Phone: [phone] Relation: Daughter Secondary Emergency Contact: Arvin Collard [place] of [place] Mobile Phone: [phone] Relation: Other  Code Status:  Full Code  Goals of care: Advanced Directive information Advanced Directives 07/23/2019  Does Patient Have a Medical Advance Directive? Yes  Type of Advance Directive [place]  Does patient want to make changes to medical advance directive? No - Patient declined  Copy of [place] in Chart? No - copy requested  Would patient like information on creating a medical advance directive? -  Pre-existing out of facility DNR order (yellow form or pink MOST form) -     Chief Complaint  Patient presents with  . Acute Visit    [place] 3 weeks ago and complains of Right Wrist/Left Shoulder Pain  . Referral    Requesting referral to Orthopedic.    HPI:  Pt is a 86 y.o. female seen today for an acute visit for evaluation of right Wrist and Left Shoulder pain post fall 3 weeks ago.she states fell forward watering flowers for her  neighbor who had surgery.she states slipped on wet pine needles.she was seen in the ED 07/11/2019.X-ray done for left shoulder was negative for fracture or dislocation.right wrist X-ray showed no acute fracture or dislocation .Osteopenia with degenerative changes noted.she complains of right wrist Pain and left shoulder pain request referral to orthopedic for evaluation.states left shoulder pain worst with lifting hand. She denies any swelling,redness,weakness of arm or numbness or tingling.  Also complains of lower abdominal pain,dyuria,urgency and frequency.states drinks enough water but has tendency for UTI.Care giver present during the visit states patient's daughter has noticed that she seems to be sleeping a lot during the day.This usually happens when she has a UTI.she denies any fever,chills,increase confusion or agitation.   No recent fall since three weeks ago.   Past Medical History:  Diagnosis Date  . Abnormality of gait   . Adenomatous polyp of colon 2002   27mm  . Allergic rhinitis   . Anxiety   . Anxiety and depression   . Chronic back pain   . Dementia without behavioral disturbance ([place])   . Depression   . Diabetes ([place])   . Diverticulosis of colon   . Dry eye syndrome   . Dysphagia   . Dysthymic disorder   . Fibromyalgia   . GERD (gastroesophageal reflux disease)   . H/O hiatal hernia   . History of adverse drug reaction   . History of cerebrovascular disease 09/24/2014  . History of recurrent UTIs   . Hypertension, benign   . Irritable bowel syndrome   . Low back pain syndrome   . Memory loss   . Mitral valve prolapse   . Paroxysmal A-fib ([place])   .  Peripheral neuropathy    "both feet and legs"  . Physical deconditioning   . Sjogren's syndrome ([place])   . Therapeutic opioid-induced constipation (OIC)   . Thyroid nodule   . Urinary incontinence    Past Surgical History:  Procedure Laterality Date  . ABDOMINAL HYSTERECTOMY  1967  . APPENDECTOMY    . CARDIAC  CATHETERIZATION  02/17/2003   normal L main, LAD free of disease, Cfx free of disease, RCA free of disease (Dr. Loni Muse. [place])  . CATARACT EXTRACTION, BILATERAL    . CHOLECYSTECTOMY  2000  . COLONOSCOPY W/ BIOPSIES     multiple  . DENTAL SURGERY     multiple tooth extractions  . ESOPHAGOGASTRODUODENOSCOPY (EGD) WITH ESOPHAGEAL DILATION N/A 08/23/2012   Procedure: ESOPHAGOGASTRODUODENOSCOPY (EGD) WITH ESOPHAGEAL DILATION;  Surgeon: Milus Banister, MD;  Location: [facility];  Service: Endoscopy;  Laterality: N/A;  . NASAL SEPTUM SURGERY  1980  . NM MYOCAR PERF WALL MOTION  2003   persantine - normal static and dynamic study w/apical thinning and presvered LV function, no ischemia  . SINUS EXPLORATION     ossifiying fibroma  . TEMPOROMANDIBULAR JOINT SURGERY  1986   Dr. Terence Lux  . TRANSTHORACIC ECHOCARDIOGRAM  2001   mild LVH, normal LV    Allergies  Allergen Reactions  . Banana Nausea And Vomiting  . Codeine Nausea Only    unless given with Phenergan  . Klonopin [Clonazepam] Other (See Comments)    Causes hallucination   . Meperidine Hcl Nausea Only    unless given with Phenergan  . Norflex [Orphenadrine Citrate] Nausea Only    Unless given with Phenergan  . Oxycodone-Acetaminophen Nausea Only    unless given with phenergan  . Propoxyphene Hcl Nausea Only    unless given with phenergan  . Zoloft [Sertraline Hcl] Other (See Comments)    Caused lethargy  . Doxycycline Other (See Comments)    Unknown reaction  . Naproxen Other (See Comments)    Unknown reaction  . Penicillins Other (See Comments)    Unknown reaction Has patient had a PCN reaction causing immediate rash, facial/tongue/throat swelling, SOB or lightheadedness with hypotension: Unknown Has patient had a PCN reaction causing severe rash involving mucus membranes or skin necrosis: Unknown Has patient had a PCN reaction that required hospitalization: pt was in the hospital at time of reaction Has patient had a PCN  reaction occurring within the last 10 years: Unknown If all of the above answers are "NO", then may proceed with Cephalos  . Phenothiazines Other (See Comments)    Unknown reaction  . Stelazine Other (See Comments)    Unknown reaction  . Sulfamethoxazole-Trimethoprim Other (See Comments)    Unknown reaction  . Tolectin [Tolmetin Sodium] Other (See Comments)    Unknown reaction  . Tramadol Other (See Comments)    Unknown reaction    Outpatient Encounter Medications as of 07/23/2019  Medication Sig  . acetaminophen (TYLENOL) 500 MG tablet Take 1,000 mg by mouth 2 (two) times daily.  Marland Kitchen ALPRAZolam (XANAX) 0.25 MG tablet Take 1 tablet by mouth twice daily as needed  . antiseptic oral rinse (BIOTENE) LIQD 15 mLs by Mouth Rinse route 5 (five) times daily as needed for dry mouth.  Marland Kitchen aspirin 325 MG tablet Take 1 tablet (325 mg total) by mouth daily.  . Capsaicin-Menthol-Methyl Sal (CAPSAICIN-METHYL SAL-MENTHOL) 0.025-1-12 % CREA Apply 1 application topically 2 (two) times daily.  . carboxymethylcellulose (REFRESH TEARS) 0.5 % SOLN Place 1 drop into both eyes 5 (  five) times daily as needed (dry eyes).   . ciprofloxacin (CIPRO) 500 MG tablet Take 1 tablet (500 mg total) by mouth 2 (two) times daily.  Marland Kitchen CRANBERRY PO Take 2 tablets by mouth daily.  Marland Kitchen diltiazem (CARDIZEM CD) 180 MG 24 hr capsule TAKE 1 CAPSULE BY MOUTH ONCE DAILY  . docusate sodium (COLACE) 100 MG capsule Take 2 capsules (200 mg total) by mouth daily.  . DULoxetine (CYMBALTA) 30 MG capsule Take one capsule by mouth once daily for back pain and for depression.  . DULoxetine (CYMBALTA) 60 MG capsule Take 1 capsule (60 mg total) by mouth daily.  . furosemide (LASIX) 40 MG tablet Take 1 tablet (40 mg total) by mouth daily.  . hydrocortisone (ANUSOL-HC) 2.5 % rectal cream Place 1 application rectally daily. Use for 10 days  . lansoprazole (PREVACID) 30 MG capsule Take one capsule by mouth once daily at noon  . linaclotide (LINZESS) 290 MCG  CAPS capsule TAKE 1 CAPSULE BY MOUTH EVERY DAY AT BEDTIME  . memantine (NAMENDA) 10 MG tablet Take 1 tablet (10 mg total) by mouth 2 (two) times daily.  . mupirocin ointment (BACTROBAN) 2 % Place 1 application into the nose 2 (two) times daily. Right nostril until soreness resolves  . MYRBETRIQ 50 MG TB24 tablet Take 1 tablet by mouth once daily  . NIACIN PO Take by mouth daily.  Marland Kitchen nystatin (MYCOSTATIN) 100000 UNIT/ML suspension Take 5 mLs (500,000 Units total) by mouth 4 (four) times daily. Until soreness of mouth resolves  . ONE TOUCH LANCETS MISC Use to test blood sugar twice daily.  Dx: E11.9  . ONETOUCH VERIO test strip USE ONE STRIP TO CHECK GLUCOSE TWICE DAILY AS DIRECTED  . pilocarpine (SALAGEN) 5 MG tablet Take 1 tablet by mouth twice daily  . polyethylene glycol (MIRALAX / GLYCOLAX) packet Take by mouth daily as needed (constipation). Mix 1/2 capful of miralax in 8 oz liquid and drink  . potassium chloride SA (KLOR-CON) 20 MEQ tablet Take 1 tablet (20 mEq total) by mouth daily.  . pregabalin (LYRICA) 100 MG capsule Take 1 capsule (100 mg total) by mouth 2 (two) times daily.  Marland Kitchen saccharomyces boulardii (FLORASTOR) 250 MG capsule Take 1 capsule (250 mg total) by mouth 2 (two) times daily.  . sitaGLIPtin (JANUVIA) 100 MG tablet Take 1 tablet (100 mg total) by mouth daily.   No facility-administered encounter medications on file as of 07/23/2019.    Review of Systems  Constitutional: Negative for appetite change, chills, fatigue and fever.  Respiratory: Negative for cough, chest tightness, shortness of breath and wheezing.   Cardiovascular: Negative for chest pain, palpitations and leg swelling.  Gastrointestinal: Negative for abdominal distention, constipation, diarrhea, nausea and vomiting.       Lower abdominal pain   Genitourinary: Positive for difficulty urinating, dysuria, frequency and urgency. Negative for flank pain and hematuria.  Musculoskeletal: Positive for arthralgias and  gait problem. Negative for joint swelling and myalgias.  Skin: Negative for color change, pallor and rash.  Neurological: Negative for dizziness, light-headedness and headaches.    Immunization History  Administered Date(s) Administered  . Fluad Quad(high Dose 65+) 11/08/2018  . Influenza Split 12/26/2010, 01/15/2012  . Influenza Whole 12/30/2008, 11/15/2009  . Influenza, High Dose Seasonal PF 12/21/2016, 11/05/2017  . Influenza,inj,Quad PF,6+ Mos 02/04/2013, 11/03/2013, 12/05/2014, 12/13/2015  . Moderna SARS-COVID-2 Vaccination 04/17/2019, 05/15/2019  . Pneumococcal Conjugate-13 06/12/2014  . Pneumococcal Polysaccharide-23 02/15/2009, 12/05/2014  . Tdap 12/08/2013  . Zoster 01/29/2012   Pertinent  Health Maintenance Due  Topic Date Due  . OPHTHALMOLOGY EXAM  Never done  . HEMOGLOBIN A1C  09/05/2018  . MAMMOGRAM  09/12/2019  . INFLUENZA VACCINE  09/14/2019  . FOOT EXAM  01/28/2020  . DEXA SCAN  Completed  . PNA vac Low Risk Adult  Completed  . URINE MICROALBUMIN  Discontinued   Fall Risk  07/23/2019 06/12/2019 06/12/2019 04/07/2019 03/06/2019  Falls in the past year? 1 1 1  0 0  Number falls in past yr: 0 1 1 0 -  Injury with Fall? 1 1 1  0 0  Comment - - - - -  Risk Factor Category  - - - - -  Risk for fall due to : - - - - -  Follow up - - - - -    Vitals:   07/23/19 1328  BP: 132/78  Pulse: 76  Resp: 16  Temp: (!) 96.6 F (35.9 C)  SpO2: 93%  Weight: 153 lb 12.8 oz (69.8 kg)  Height: 5\' 3"  (1.6 m)   Body mass index is 27.24 kg/m. Physical Exam Vitals reviewed.  Constitutional:      General: She is not in acute distress.    Appearance: She is overweight. She is not ill-appearing.  Eyes:     General: No scleral icterus.       Right eye: No discharge.        Left eye: No discharge.     Conjunctiva/sclera: Conjunctivae normal.     Pupils: Pupils are equal, round, and reactive to light.  Neck:     Vascular: No carotid bruit.  Cardiovascular:     Rate and  Rhythm: Normal rate and regular rhythm.     Pulses: Normal pulses.     Heart sounds: Normal heart sounds. No murmur. No friction rub. No gallop.   Pulmonary:     Effort: Pulmonary effort is normal. No respiratory distress.     Breath sounds: Normal breath sounds. No wheezing, rhonchi or rales.  Chest:     Chest wall: No tenderness.  Abdominal:     General: Bowel sounds are normal. There is no distension.     Palpations: Abdomen is soft. There is no mass.     Tenderness: There is abdominal tenderness in the suprapubic area. There is no right CVA tenderness, left CVA tenderness, guarding or rebound.  Musculoskeletal:     Right shoulder: Normal.     Left shoulder: Tenderness present. No swelling, deformity, effusion, bony tenderness or crepitus. Decreased range of motion. Normal strength. Normal pulse.     Right wrist: No swelling, deformity, effusion, tenderness or crepitus. Normal range of motion. Normal pulse.     Left wrist: Normal.     Cervical back: Normal range of motion. No rigidity or tenderness.     Right lower leg: No edema.     Left lower leg: No edema.  Lymphadenopathy:     Cervical: No cervical adenopathy.  Skin:    General: Skin is warm.     Coloration: Skin is not pale.     Findings: No erythema or rash.  Neurological:     Mental Status: She is oriented to person, place, and time.     Cranial Nerves: No cranial nerve deficit.     Sensory: No sensory deficit.     Motor: No weakness.     Gait: Gait abnormal.  Psychiatric:        Mood and Affect: Mood normal.  Behavior: Behavior normal.        Thought Content: Thought content normal.        Judgment: Judgment normal.    Labs reviewed: Recent Labs    12/13/18 1048 03/04/19 1505 05/20/19 1646  NA 140 138 139  K 3.8 4.1 4.3  CL 105 102 103  CO2 27 26 26   GLUCOSE 120* 118* 110*  BUN 27* 25* 17  CREATININE 0.72 0.78 0.55  CALCIUM 9.6 9.9 9.5   Recent Labs    12/04/18 1245 03/04/19 1505  05/20/19 1646  AST 17 24 21   ALT 16 22 26   ALKPHOS 77 89 90  BILITOT 0.4 0.7 0.4  PROT 7.6 8.1 7.4  ALBUMIN 4.4 4.5 4.1   Recent Labs    12/13/18 1048 03/04/19 1505 05/20/19 1646  WBC 8.0 7.0 6.1  NEUTROABS 5.4 4.0 3.3  HGB 13.4 13.7 13.2  HCT 40.9 42.2 40.9  MCV 96.2 96.1 96.7  PLT 211 207 199   Lab Results  Component Value Date   TSH 1.35 09/04/2017   Lab Results  Component Value Date   HGBA1C 6.0 (H) 03/07/2018   Lab Results  Component Value Date   CHOL 183 09/06/2017   HDL 63 09/06/2017   LDLCALC 99 09/06/2017   LDLDIRECT 65.7 07/08/2010   TRIG 113 09/06/2017   CHOLHDL 2.9 09/06/2017    Significant Diagnostic Results in last 30 days:  DG Wrist Complete Right  Result Date: 07/11/2019 CLINICAL DATA:  86 year old female with fall and right wrist pain. EXAM: RIGHT WRIST - COMPLETE 3+ VIEW COMPARISON:  None. FINDINGS: There is no acute fracture or dislocation. The bones are osteopenic. There are degenerative changes of the first-third MCP joints as well as arthritic changes of the base of the thumb. Mild soft tissue swelling of the wrist. No radiopaque foreign object or soft tissue gas. IMPRESSION: 1. No acute fracture or dislocation. 2. Osteopenia with degenerative changes. Electronically Signed   By: Anner Crete M.D.   On: 07/11/2019 15:43   DG Shoulder Left  Result Date: 07/11/2019 CLINICAL DATA:  Pain status post fall EXAM: LEFT SHOULDER - 2+ VIEW COMPARISON:  12/13/2018 FINDINGS: There is no evidence of fracture or dislocation. There is no evidence of arthropathy or other focal bone abnormality. Soft tissues are unremarkable. IMPRESSION: Negative. Electronically Signed   By: Constance Holster M.D.   On: 07/11/2019 15:40    Assessment/Plan 1. Symptoms of urinary tract infection Afebrile.Suprapubic tenderness on palpation. - POC Urinalysis Dipstick yellow color clear ,negative for nitrites,protein,blood and leukocytes.Results discussed with patient and care  giver. Verbalized understanding. - Encouraged to increase water intake and notify provider's office if symptoms worsen or not resolved.     2. Right wrist pain Reports pain on wrist area,no redness,swelling or tenderness noted.sensation,pulse and strength intact. - recent X-ray in the ED reviewed was negative for acute abnormalities.showed Osteopenia. - Ambulatory referral to Orthopedic Surgery  3. Left shoulder pain, unspecified chronicity Limited ROM with abduction of arm .AC joint tenderness with palpation. Recent X-ray in the ED was negative for fracture,dislocation or arthritis  - advised to continue with Extra strength tylenol  - Ambulatory referral to Orthopedic Surgery for evaluation.  Family/ staff Communication: Reviewed plan of care with patient and care giver   Labs/tests ordered: None   Next Appointment: 4 months for medical management of chronic issues with Dr.Reed.  Sandrea Hughs, NP

## 2019-07-28 ENCOUNTER — Ambulatory Visit: Payer: Medicare Other | Admitting: Orthopedic Surgery

## 2019-07-28 ENCOUNTER — Other Ambulatory Visit: Payer: Self-pay

## 2019-07-28 ENCOUNTER — Encounter: Payer: Self-pay | Admitting: Physician Assistant

## 2019-07-28 VITALS — Ht 63.0 in | Wt 153.8 lb

## 2019-07-28 DIAGNOSIS — M7542 Impingement syndrome of left shoulder: Secondary | ICD-10-CM | POA: Diagnosis not present

## 2019-07-28 MED ORDER — METHYLPREDNISOLONE ACETATE 40 MG/ML IJ SUSP
40.0000 mg | INTRAMUSCULAR | Status: AC | PRN
  Administered 2019-07-28: 40 mg via INTRA_ARTICULAR

## 2019-07-28 MED ORDER — LIDOCAINE HCL 1 % IJ SOLN
5.0000 mL | INTRAMUSCULAR | Status: AC | PRN
  Administered 2019-07-28: 5 mL

## 2019-07-28 NOTE — Progress Notes (Signed)
Office Visit Note   Patient: Ariana [name]           Date of Birth: 06-30-1929           MRN: [id number] Visit Date: 07/28/2019              Requested by: Sandrea Hughs, NP [address] [place],  [place] [address] PCP: Gayland Curry, DO  Chief Complaint  Patient presents with  . Left Shoulder - Pain, New Patient (Initial Visit)  . Right Wrist - Pain      HPI: Patient is a 86 year old woman who presents complaining of left shoulder pain and right thumb pain.  She states she did go to the emergency department and was given a thumb spica splint she states that this was painful to wear.  Patient complains of pain with elevation and internal rotation of the left shoulder.  She also complains of pain over the veins of the right forearm.  Assessment & Plan: Visit Diagnoses:  1. Impingement syndrome of left shoulder     Plan: Patient's left shoulder was injected.  She will follow-up as needed for repeat injection.  Discussed that for the carpometacarpal arthritis of the right hand she could try the thumb spica splint again recommended trial of Voltaren gel.  Discussed the surgical treatment and feel like patient should resolve her symptoms with the Voltaren gel.  Follow-Up Instructions: Return if symptoms worsen or fail to improve.   Ortho Exam  Patient is alert, oriented, no adenopathy, well-dressed, normal affect, normal respiratory effort. On examination patient has abduction flexion left shoulder to 70 degrees she has pain with Neer and Hawkins impingement test no adhesive capsulitis.  Examination of the right thumb she has a negative Finkelstein's test scaphoid scapholunate and TFCC are minimally tender to palpation  Patient has pain to palpation right thumb carpometacarpal joint.  This reproduces her symptoms.  Imaging: No results found. No images are attached to the encounter.  Labs: Lab Results  Component Value Date   HGBA1C 6.0 (H) 03/07/2018   HGBA1C 6.4 (H)  09/04/2017   HGBA1C 9.1 (H) 03/22/2017   ESRSEDRATE 5 08/23/2012   ESRSEDRATE 16 07/18/2011   ESRSEDRATE 11 07/08/2010   REPTSTATUS 05/23/2019 FINAL 05/20/2019   CULT >=100,000 COLONIES/mL ESCHERICHIA COLI (A) 05/20/2019   LABORGA ESCHERICHIA COLI (A) 05/20/2019     Lab Results  Component Value Date   ALBUMIN 4.1 05/20/2019   ALBUMIN 4.5 03/04/2019   ALBUMIN 4.4 12/04/2018    Lab Results  Component Value Date   MG 2.0 04/24/2017   MG 2.0 04/23/2017   MG 2.0 06/29/2015   Lab Results  Component Value Date   VD25OH 52 05/16/2012    No results found for: PREALBUMIN CBC EXTENDED Latest Ref Rng & Units 05/20/2019 03/04/2019 12/13/2018  WBC 4.0 - 10.5 K/uL 6.1 7.0 8.0  RBC 3.87 - 5.11 MIL/uL 4.23 4.39 4.25  HGB 12.0 - 15.0 g/dL 13.2 13.7 13.4  HCT 36 - 46 % 40.9 42.2 40.9  PLT 150 - 400 K/uL 199 207 211  NEUTROABS 1.7 - 7.7 K/uL 3.3 4.0 5.4  LYMPHSABS 0.7 - 4.0 K/uL 1.8 2.2 1.6     Body mass index is 27.24 kg/m.  Orders:  No orders of the defined types were placed in this encounter.  No orders of the defined types were placed in this encounter.    Procedures: Large Joint Inj: L subacromial bursa on 07/28/2019 3:53 PM Indications: diagnostic evaluation  and pain Details: 22 G 1.5 in needle, posterior approach  Arthrogram: No  Medications: 5 mL lidocaine 1 %; 40 mg methylPREDNISolone acetate 40 MG/ML Outcome: tolerated well, no immediate complications Procedure, treatment alternatives, risks and benefits explained, specific risks discussed. Consent was given by the patient. Immediately prior to procedure a time out was called to verify the correct patient, procedure, equipment, support staff and site/side marked as required. Patient was prepped and draped in the usual sterile fashion.      Clinical Data: No additional findings.  ROS:  All other systems negative, except as noted in the HPI. Review of Systems  Objective: Vital Signs: Ht 5\' 3"  (1.6 m)   Wt 153  lb 12.8 oz (69.8 kg)   BMI 27.24 kg/m   Specialty Comments:  No specialty comments available.  PMFS History: Patient Active Problem List   Diagnosis Date Noted  . Diabetic neuropathy ([place]) 01/28/2019  . Diabetes mellitus due to underlying condition, uncontrolled ([place]) 09/04/2017  . History of rectal bleeding 09/04/2017  . A-fib ([place]) 04/23/2017  . Diabetes mellitus type 2 in nonobese ([place]) 04/23/2017  . Sjogren's disease ([place]) 04/03/2016  . Primary osteoarthritis of both hands 04/03/2016  . High risk medication use 04/03/2016  . Senile dementia, with behavioral disturbance ([place]) 08/16/2015  . Swelling 08/16/2015  . Diastolic dysfunction 54/27/0623  . Hypertonicity, bladder 07/08/2015  . Arterial hypotension   . Bradycardia 06/29/2015  . Chronic lower back pain 06/29/2015  . PAF (paroxysmal atrial fibrillation) ([place]) 04/18/2015  . Dehydration 04/18/2015  . Dementia without behavioral disturbance ([place]) 04/18/2015  . TIA (transient ischemic attack) 12/17/2014  . Chest pain 12/16/2014  . Acute encephalopathy 12/04/2014  . Fall   . AKI (acute kidney injury) ([place]) 12/03/2014  . History of cerebrovascular disease 09/24/2014  . Falls frequently 07/28/2014  . Infarction of parietal lobe ([place])   . Mild dementia ([place])   . CVA (cerebral infarction) 07/26/2014  . Atrial fibrillation with RVR ([place]) 07/03/2014  . Constipation 04/15/2014  . Mixed stress and urge urinary incontinence 11/03/2013  . Therapeutic opioid induced constipation 11/03/2013  . Hemorrhoid 11/03/2013  . Low back pain associated with a spinal disorder other than radiculopathy or spinal stenosis 11/03/2013  . Protein-calorie malnutrition, severe ([place]) 07/22/2013  . UTI (lower urinary tract infection) 07/20/2013  . Prolonged QT interval 07/20/2013  . Osteopenia 07/17/2013  . Palpitations 06/30/2013  . Hereditary and idiopathic peripheral neuropathy 05/22/2013  . Abnormality of gait 12/05/2012  . Headache(784.0)  09/05/2012  . Multinodular thyroid 01/15/2012  . Neck pain 01/15/2012  . Hypercholesterolemia 07/08/2010  . Tear film insufficiency 07/06/2009  . [place] SYNDROME 07/06/2009  . Urge urinary incontinence 01/06/2009  . COLONIC POLYPS, ADENOMATOUS, HX OF 04/15/2008  . MITRAL VALVE PROLAPSE 11/05/2007  . ANXIETY DEPRESSION 07/02/2007  . Mononeuritis 07/02/2007  . HYPERTENSION, BENIGN 07/02/2007  . GERD 07/02/2007  . Irritable bowel syndrome 07/02/2007  . Fibromyalgia 07/02/2007   Past Medical History:  Diagnosis Date  . Abnormality of gait   . Adenomatous polyp of colon 2002   90mm  . Allergic rhinitis   . Anxiety   . Anxiety and depression   . Chronic back pain   . Dementia without behavioral disturbance ([place])   . Depression   . Diabetes ([place])   . Diverticulosis of colon   . Dry eye syndrome   . Dysphagia   . Dysthymic disorder   . Fibromyalgia   . GERD (gastroesophageal reflux disease)   . H/O hiatal  hernia   . History of adverse drug reaction   . History of cerebrovascular disease 09/24/2014  . History of recurrent UTIs   . Hypertension, benign   . Irritable bowel syndrome   . Low back pain syndrome   . Memory loss   . Mitral valve prolapse   . Paroxysmal A-fib ([place])   . Peripheral neuropathy    "both feet and legs"  . Physical deconditioning   . Sjogren's syndrome ([place])   . Therapeutic opioid-induced constipation (OIC)   . Thyroid nodule   . Urinary incontinence     Family History  Problem Relation Age of Onset  . Heart disease Father        heart attack  . Pneumonia Father   . Heart attack Mother   . Hypertension Mother   . Colon cancer Sister   . Kidney disease Daughter   . Asthma Daughter   . Arthritis Daughter 59       osteo,  . Heart disease Son 67       stage 3 CHF(Diastolic /Systolic)  . Throat cancer Brother   . Hypertension Maternal Grandmother     Past Surgical History:  Procedure Laterality Date  . ABDOMINAL HYSTERECTOMY  1967  .  APPENDECTOMY    . CARDIAC CATHETERIZATION  02/17/2003   normal L main, LAD free of disease, Cfx free of disease, RCA free of disease (Dr. Loni Muse. [place])  . CATARACT EXTRACTION, BILATERAL    . CHOLECYSTECTOMY  2000  . COLONOSCOPY W/ BIOPSIES     multiple  . DENTAL SURGERY     multiple tooth extractions  . ESOPHAGOGASTRODUODENOSCOPY (EGD) WITH ESOPHAGEAL DILATION N/A 08/23/2012   Procedure: ESOPHAGOGASTRODUODENOSCOPY (EGD) WITH ESOPHAGEAL DILATION;  Surgeon: Milus Banister, MD;  Location: [facility];  Service: Endoscopy;  Laterality: N/A;  . NASAL SEPTUM SURGERY  1980  . NM MYOCAR PERF WALL MOTION  2003   persantine - normal static and dynamic study w/apical thinning and presvered LV function, no ischemia  . SINUS EXPLORATION     ossifiying fibroma  . TEMPOROMANDIBULAR JOINT SURGERY  1986   Dr. Terence Lux  . TRANSTHORACIC ECHOCARDIOGRAM  2001   mild LVH, normal LV   Social History   Occupational History  . Occupation: Retired  Tobacco Use  . Smoking status: Former Research scientist (life sciences)  . Smokeless tobacco: Never Used  . Tobacco comment: Quit at age 85   Vaping Use  . Vaping Use: Never used  Substance and Sexual Activity  . Alcohol use: No  . Drug use: Never  . Sexual activity: Never

## 2019-07-31 ENCOUNTER — Other Ambulatory Visit: Payer: Self-pay | Admitting: Rheumatology

## 2019-07-31 ENCOUNTER — Other Ambulatory Visit: Payer: Self-pay | Admitting: Internal Medicine

## 2019-07-31 DIAGNOSIS — M545 Low back pain, unspecified: Secondary | ICD-10-CM

## 2019-07-31 DIAGNOSIS — G6289 Other specified polyneuropathies: Secondary | ICD-10-CM

## 2019-07-31 NOTE — Telephone Encounter (Signed)
Last Visit: 03/19/19 Next visit: 03/18/20  Okay to refill per Dr. Deveshwar 

## 2019-08-08 ENCOUNTER — Other Ambulatory Visit: Payer: Self-pay | Admitting: Family

## 2019-08-08 DIAGNOSIS — E119 Type 2 diabetes mellitus without complications: Secondary | ICD-10-CM

## 2019-08-14 ENCOUNTER — Other Ambulatory Visit: Payer: Self-pay | Admitting: Internal Medicine

## 2019-08-14 NOTE — Telephone Encounter (Signed)
rx sent to pharmacy by e-script  

## 2019-08-28 ENCOUNTER — Ambulatory Visit (INDEPENDENT_AMBULATORY_CARE_PROVIDER_SITE_OTHER): Payer: Medicare Other | Admitting: Internal Medicine

## 2019-08-28 ENCOUNTER — Other Ambulatory Visit: Payer: Self-pay

## 2019-08-28 ENCOUNTER — Encounter: Payer: Self-pay | Admitting: Internal Medicine

## 2019-08-28 VITALS — BP 122/72 | HR 77 | Temp 97.7°F | Ht 65.0 in | Wt 152.0 lb

## 2019-08-28 DIAGNOSIS — F0150 Vascular dementia without behavioral disturbance: Secondary | ICD-10-CM

## 2019-08-28 DIAGNOSIS — R140 Abdominal distension (gaseous): Secondary | ICD-10-CM

## 2019-08-28 DIAGNOSIS — E0865 Diabetes mellitus due to underlying condition with hyperglycemia: Secondary | ICD-10-CM | POA: Diagnosis not present

## 2019-08-28 DIAGNOSIS — R300 Dysuria: Secondary | ICD-10-CM

## 2019-08-28 NOTE — Progress Notes (Signed)
Location:  [facility] clinic Provider: Dea Bitting L. Mariea Clonts, D.O., C.M.D.  Goals of Care:  Advanced Directives 08/28/2019  Does Patient Have a Medical Advance Directive? Yes  Type of Advance Directive [place]  Does patient want to make changes to medical advance directive? No - Patient declined  Copy of [place] in Chart? Yes - validated most recent copy scanned in chart (See row information)  Would patient like information on creating a medical advance directive? -  Pre-existing out of facility DNR order (yellow form or pink MOST form) -     Chief Complaint  Patient presents with  . Acute Visit    Possible UTI  symptoms x 4 days     HPI: Patient is a 86 y.o. female seen today for an acute visit for "UTI symptoms for 4 days".  She's c/o her abdominal bloating.  She's having dysuria.  Urine was real dark yellow.  No blood seen.  She did have some blood on wipes in the trash (has bm sometimes at same time she urinates--will see blood with stool).  No change in feeling cold from baseline.  Some periods of urinary frequency--caregiver notes she's had to get up more than normal.  Mood has not been different than usual.   New caregiver is with her 9-5 weekdays.    Past Medical History:  Diagnosis Date  . Abnormality of gait   . Adenomatous polyp of colon 2002   28mm  . Allergic rhinitis   . Anxiety   . Anxiety and depression   . Chronic back pain   . Dementia without behavioral disturbance ([place])   . Depression   . Diabetes ([place])   . Diverticulosis of colon   . Dry eye syndrome   . Dysphagia   . Dysthymic disorder   . Fibromyalgia   . GERD (gastroesophageal reflux disease)   . H/O hiatal hernia   . History of adverse drug reaction   . History of cerebrovascular disease 09/24/2014  . History of recurrent UTIs   . Hypertension, benign   . Irritable bowel syndrome   . Low back pain syndrome   . Memory loss   . Mitral valve prolapse   . Paroxysmal  A-fib ([place])   . Peripheral neuropathy    "both feet and legs"  . Physical deconditioning   . Sjogren's syndrome ([place])   . Therapeutic opioid-induced constipation (OIC)   . Thyroid nodule   . Urinary incontinence     Past Surgical History:  Procedure Laterality Date  . ABDOMINAL HYSTERECTOMY  1967  . APPENDECTOMY    . CARDIAC CATHETERIZATION  02/17/2003   normal L main, LAD free of disease, Cfx free of disease, RCA free of disease (Dr. Loni Muse. Little)  . CATARACT EXTRACTION, BILATERAL    . CHOLECYSTECTOMY  2000  . COLONOSCOPY W/ BIOPSIES     multiple  . DENTAL SURGERY     multiple tooth extractions  . ESOPHAGOGASTRODUODENOSCOPY (EGD) WITH ESOPHAGEAL DILATION N/A 08/23/2012   Procedure: ESOPHAGOGASTRODUODENOSCOPY (EGD) WITH ESOPHAGEAL DILATION;  Surgeon: Milus Banister, MD;  Location: [facility];  Service: Endoscopy;  Laterality: N/A;  . NASAL SEPTUM SURGERY  1980  . NM MYOCAR PERF WALL MOTION  2003   persantine - normal static and dynamic study w/apical thinning and presvered LV function, no ischemia  . SINUS EXPLORATION     ossifiying fibroma  . TEMPOROMANDIBULAR JOINT SURGERY  1986   Dr. Terence Lux  . TRANSTHORACIC ECHOCARDIOGRAM  2001  mild LVH, normal LV    Allergies  Allergen Reactions  . Banana Nausea And Vomiting  . Codeine Nausea Only    unless given with Phenergan  . Klonopin [Clonazepam] Other (See Comments)    Causes hallucination   . Meperidine Hcl Nausea Only    unless given with Phenergan  . Norflex [Orphenadrine Citrate] Nausea Only    Unless given with Phenergan  . Oxycodone-Acetaminophen Nausea Only    unless given with phenergan  . Propoxyphene Hcl Nausea Only    unless given with phenergan  . Zoloft [Sertraline Hcl] Other (See Comments)    Caused lethargy  . Doxycycline Other (See Comments)    Unknown reaction  . Naproxen Other (See Comments)    Unknown reaction  . Penicillins Other (See Comments)    Unknown reaction Has patient had a PCN  reaction causing immediate rash, facial/tongue/throat swelling, SOB or lightheadedness with hypotension: Unknown Has patient had a PCN reaction causing severe rash involving mucus membranes or skin necrosis: Unknown Has patient had a PCN reaction that required hospitalization: pt was in the hospital at time of reaction Has patient had a PCN reaction occurring within the last 10 years: Unknown If all of the above answers are "NO", then may proceed with Cephalos  . Phenothiazines Other (See Comments)    Unknown reaction  . Stelazine Other (See Comments)    Unknown reaction  . Sulfamethoxazole-Trimethoprim Other (See Comments)    Unknown reaction  . Tolectin [Tolmetin Sodium] Other (See Comments)    Unknown reaction  . Tramadol Other (See Comments)    Unknown reaction    Outpatient Encounter Medications as of 08/28/2019  Medication Sig  . acetaminophen (TYLENOL) 500 MG tablet Take 1,000 mg by mouth 2 (two) times daily.  Marland Kitchen ALPRAZolam (XANAX) 0.25 MG tablet Take 1 tablet by mouth twice daily as needed  . antiseptic oral rinse (BIOTENE) LIQD 15 mLs by Mouth Rinse route 5 (five) times daily as needed for dry mouth.  Marland Kitchen aspirin 325 MG tablet Take 1 tablet (325 mg total) by mouth daily.  . Capsaicin-Menthol-Methyl Sal (CAPSAICIN-METHYL SAL-MENTHOL) 0.025-1-12 % CREA Apply 1 application topically 2 (two) times daily.  . carboxymethylcellulose (REFRESH TEARS) 0.5 % SOLN Place 1 drop into both eyes 5 (five) times daily as needed (dry eyes).   . ciprofloxacin (CIPRO) 500 MG tablet Take 1 tablet (500 mg total) by mouth 2 (two) times daily.  Marland Kitchen CRANBERRY PO Take 2 tablets by mouth daily.  Marland Kitchen diltiazem (CARDIZEM CD) 180 MG 24 hr capsule TAKE 1 CAPSULE BY MOUTH ONCE DAILY  . docusate sodium (COLACE) 100 MG capsule Take 2 capsules (200 mg total) by mouth daily.  . DULoxetine (CYMBALTA) 30 MG capsule Take one capsule by mouth once daily for back pain and for depression.  . DULoxetine (CYMBALTA) 60 MG capsule  Take 1 capsule (60 mg total) by mouth daily.  . furosemide (LASIX) 40 MG tablet Take 1 tablet by mouth once daily  . hydrocortisone (ANUSOL-HC) 2.5 % rectal cream Place 1 application rectally daily. Use for 10 days  . JANUVIA 100 MG tablet Take 1 tablet by mouth once daily  . lansoprazole (PREVACID) 30 MG capsule Take one capsule by mouth once daily at noon  . linaclotide (LINZESS) 290 MCG CAPS capsule TAKE 1 CAPSULE BY MOUTH EVERY DAY AT BEDTIME  . memantine (NAMENDA) 10 MG tablet Take 1 tablet (10 mg total) by mouth 2 (two) times daily.  . mupirocin ointment (BACTROBAN) 2 % Place  1 application into the nose 2 (two) times daily. Right nostril until soreness resolves  . MYRBETRIQ 50 MG TB24 tablet Take 1 tablet by mouth once daily  . NIACIN PO Take by mouth daily.  Marland Kitchen nystatin (MYCOSTATIN) 100000 UNIT/ML suspension Take 5 mLs (500,000 Units total) by mouth 4 (four) times daily. Until soreness of mouth resolves  . ONE TOUCH LANCETS MISC Use to test blood sugar twice daily.  Dx: E11.9  . ONETOUCH VERIO test strip USE ONE STRIP TO CHECK GLUCOSE TWICE DAILY AS DIRECTED  . pilocarpine (SALAGEN) 5 MG tablet Take 1 tablet by mouth twice daily  . polyethylene glycol (MIRALAX / GLYCOLAX) packet Take by mouth daily as needed (constipation). Mix 1/2 capful of miralax in 8 oz liquid and drink  . potassium chloride SA (KLOR-CON) 20 MEQ tablet Take 1 tablet (20 mEq total) by mouth daily.  . pregabalin (LYRICA) 100 MG capsule Take 1 capsule by mouth twice daily  . saccharomyces boulardii (FLORASTOR) 250 MG capsule Take 1 capsule (250 mg total) by mouth 2 (two) times daily.   No facility-administered encounter medications on file as of 08/28/2019.    Review of Systems:  Review of Systems  Constitutional: Negative for chills, fever and malaise/fatigue.  HENT: Negative for congestion and sore throat.   Eyes: Negative for blurred vision.  Respiratory: Negative for shortness of breath.   Cardiovascular:  Negative for chest pain, palpitations and leg swelling.  Gastrointestinal: Positive for abdominal pain. Negative for blood in stool, constipation and melena.       Chronic distended appearance  Genitourinary: Positive for dysuria and frequency. Negative for flank pain, hematuria and urgency.  Musculoskeletal: Positive for back pain. Negative for falls.  Skin: Negative for itching and rash.  Neurological: Negative for dizziness and loss of consciousness.  Psychiatric/Behavioral: Positive for depression and memory loss. The patient is not nervous/anxious and does not have insomnia.     Health Maintenance  Topic Date Due  . OPHTHALMOLOGY EXAM  Never done  . HEMOGLOBIN A1C  09/05/2018  . MAMMOGRAM  09/12/2019  . INFLUENZA VACCINE  09/14/2019  . FOOT EXAM  01/28/2020  . TETANUS/TDAP  12/09/2023  . DEXA SCAN  Completed  . COVID-19 Vaccine  Completed  . PNA vac Low Risk Adult  Completed  . URINE MICROALBUMIN  Discontinued    Physical Exam: Vitals:   08/28/19 1530  BP: 122/72  Pulse: 77  Temp: 97.7 F (36.5 C)  TempSrc: Temporal  SpO2: 94%  Weight: 152 lb (68.9 kg)  Height: 5\' 5"  (1.651 m)   Body mass index is 25.29 kg/m. Physical Exam Vitals reviewed.  Constitutional:      Appearance: Normal appearance.  Cardiovascular:     Rate and Rhythm: Rhythm irregular.     Heart sounds: No murmur heard.   Pulmonary:     Effort: Pulmonary effort is normal.     Breath sounds: Normal breath sounds. No wheezing, rhonchi or rales.  Abdominal:     General: Bowel sounds are normal. There is distension.     Palpations: Abdomen is soft.     Tenderness: There is no abdominal tenderness.  Genitourinary:    Comments: No suprapubic tenderness or CVA tenderness Musculoskeletal:        General: Normal range of motion.     Right lower leg: No edema.     Left lower leg: No edema.  Neurological:     General: No focal deficit present.     Mental Status: She  is alert. Mental status is at  baseline.  Psychiatric:        Mood and Affect: Mood normal.     Labs reviewed: Basic Metabolic Panel: Recent Labs    12/13/18 1048 03/04/19 1505 05/20/19 1646  NA 140 138 139  K 3.8 4.1 4.3  CL 105 102 103  CO2 27 26 26   GLUCOSE 120* 118* 110*  BUN 27* 25* 17  CREATININE 0.72 0.78 0.55  CALCIUM 9.6 9.9 9.5   Liver Function Tests: Recent Labs    12/04/18 1245 03/04/19 1505 05/20/19 1646  AST 17 24 21   ALT 16 22 26   ALKPHOS 77 89 90  BILITOT 0.4 0.7 0.4  PROT 7.6 8.1 7.4  ALBUMIN 4.4 4.5 4.1   Recent Labs    03/04/19 1505 05/20/19 1646  LIPASE 32 40   No results for input(s): AMMONIA in the last 8760 hours. CBC: Recent Labs    12/13/18 1048 03/04/19 1505 05/20/19 1646  WBC 8.0 7.0 6.1  NEUTROABS 5.4 4.0 3.3  HGB 13.4 13.7 13.2  HCT 40.9 42.2 40.9  MCV 96.2 96.1 96.7  PLT 211 207 199   Lipid Panel: No results for input(s): CHOL, HDL, LDLCALC, TRIG, CHOLHDL, LDLDIRECT in the last 8760 hours. Lab Results  Component Value Date   HGBA1C 6.0 (H) 03/07/2018    Procedures since last visit: No results found.  Assessment/Plan 1. Dysuria - may be due to strong urine--discussed with caregiver who will push fluids - Urine Culture - Urinalysis, Routine w reflex microscopic  2. Vascular dementia without behavioral disturbance ([place]) -mental status does not seem different as typically occurs with her if she has a UTI so doubt it  3. Abdominal distention -not really a new thing -has IBS with constipation which contributes -reviewed h/o area on liver which seems irrelevant and decision not to work it up at her advanced age - Urine Culture - Urinalysis, Routine w reflex microscopic  4.  DIabetes:  Control improved more recently -caregiver was struggling with ideas for soft diabetic diet due to pt being edentulous  Labs/tests ordered:   Lab Orders     Urine Culture     Urinalysis, Routine w reflex microscopic  Next appt:  11/24/2019  Danyele Smejkal L.  Rondo Spittler, D.O. [facility] [address], [place] [address] Cell Phone (Mon-Fri 8am-5pm):  [phone] On Call:  [phone] & follow prompts after 5pm & weekends Office Phone:  [phone] Office Fax:  [phone]

## 2019-08-28 NOTE — Patient Instructions (Signed)
We will call when the urine culture comes back.

## 2019-08-29 ENCOUNTER — Other Ambulatory Visit: Payer: Self-pay | Admitting: Internal Medicine

## 2019-08-29 NOTE — Progress Notes (Signed)
Initial UA is positive.  Await final culture because her symptoms were not convincing for UTI this time and her cognitive status appeared to be at baseline.

## 2019-08-30 LAB — URINALYSIS, ROUTINE W REFLEX MICROSCOPIC
Bilirubin Urine: NEGATIVE
Glucose, UA: NEGATIVE
Hgb urine dipstick: NEGATIVE
Hyaline Cast: NONE SEEN /LPF
Ketones, ur: NEGATIVE
Nitrite: POSITIVE — AB
Protein, ur: NEGATIVE
Specific Gravity, Urine: 1.015 (ref 1.001–1.03)
Squamous Epithelial / HPF: NONE SEEN /HPF (ref <=5)
WBC, UA: >=60 /HPF — AB (ref 0–5)
pH: 6.5 (ref 5.0–8.0)

## 2019-08-30 LAB — URINE CULTURE
MICRO NUMBER:: 10713701
SPECIMEN QUALITY:: ADEQUATE

## 2019-08-31 MED ORDER — CEPHALEXIN 500 MG PO CAPS: 500.0000 mg | ORAL_CAPSULE | Freq: Two times a day (BID) | ORAL | 0 refills | Status: DC

## 2019-08-31 NOTE — Progress Notes (Signed)
Please notify daughter:   Urine culture did grow out E coli which is bacteria that is found in the stool.  This is likely due to some incontinence and pt doing her own personal hygiene (perhaps not wiping front to back or cleaning herself well enough with balance and mobility issues).  She also does not have any assistance on weekends.  I will send in keflex 500mg  po bid for 7 days to treat this.  She should eat yogurt daily while taking the keflex.  As always, encourage 6-8 8oz glasses of water per day.

## 2019-08-31 NOTE — Addendum Note (Signed)
Addended by: Gayland Curry on: 08/31/2019 09:48 AM   Modules accepted: Orders

## 2019-09-01 ENCOUNTER — Other Ambulatory Visit: Payer: Self-pay | Admitting: Internal Medicine

## 2019-09-01 ENCOUNTER — Other Ambulatory Visit: Payer: Self-pay

## 2019-09-01 DIAGNOSIS — M545 Low back pain, unspecified: Secondary | ICD-10-CM

## 2019-09-01 DIAGNOSIS — G6289 Other specified polyneuropathies: Secondary | ICD-10-CM

## 2019-09-01 MED ORDER — PREGABALIN 100 MG PO CAPS: 100.0000 mg | ORAL_CAPSULE | Freq: Two times a day (BID) | ORAL | 0 refills | Status: DC

## 2019-09-01 NOTE — Telephone Encounter (Signed)
Sent to Dr. Reed for approval  

## 2019-09-03 ENCOUNTER — Other Ambulatory Visit: Payer: Self-pay | Admitting: *Deleted

## 2019-09-03 DIAGNOSIS — M545 Low back pain, unspecified: Secondary | ICD-10-CM

## 2019-09-03 DIAGNOSIS — G6289 Other specified polyneuropathies: Secondary | ICD-10-CM

## 2019-09-03 MED ORDER — PREGABALIN 100 MG PO CAPS: 100.0000 mg | ORAL_CAPSULE | Freq: Two times a day (BID) | ORAL | 3 refills | Status: DC

## 2019-09-03 NOTE — Telephone Encounter (Signed)
Patient daughter called requesting refill.  Pended Rx and sent to [name] for approval.

## 2019-09-25 ENCOUNTER — Encounter: Payer: Self-pay | Admitting: Orthopedic Surgery

## 2019-09-25 ENCOUNTER — Ambulatory Visit: Payer: Self-pay

## 2019-09-25 ENCOUNTER — Ambulatory Visit (INDEPENDENT_AMBULATORY_CARE_PROVIDER_SITE_OTHER): Payer: Medicare Other | Admitting: Physician Assistant

## 2019-09-25 DIAGNOSIS — M545 Low back pain, unspecified: Secondary | ICD-10-CM

## 2019-09-25 DIAGNOSIS — M7542 Impingement syndrome of left shoulder: Secondary | ICD-10-CM

## 2019-09-25 MED ORDER — METHYLPREDNISOLONE ACETATE 40 MG/ML IJ SUSP
40.0000 mg | INTRAMUSCULAR | Status: AC | PRN
  Administered 2019-09-25: 40 mg via INTRA_ARTICULAR

## 2019-09-25 MED ORDER — LIDOCAINE HCL 1 % IJ SOLN
5.0000 mL | INTRAMUSCULAR | Status: AC | PRN
  Administered 2019-09-25: 5 mL

## 2019-09-25 NOTE — Progress Notes (Signed)
Office Visit Note   Patient: Ariana [name]           Date of Birth: 1929-02-24           MRN: [id number] Visit Date: 09/25/2019              Requested by: Gayland Curry, DO [place],  [place] [address] PCP: Gayland Curry, DO  Chief Complaint  Patient presents with   Left Shoulder - Pain   Lower Back - Pain      HPI: This is a pleasant 86 year old woman who is accompanied by a family member.  She presents for 2 reasons 1 is left shoulder pain.  She had x-ray a couple months ago.  She describes the pain is increasing and wonders if she could get an injection.  She also has a history of lower back pain.  This radiates down her left lower extremity.  She denies any weakness.  Assessment & Plan: Visit Diagnoses:  1. Low back pain, unspecified back pain laterality, unspecified chronicity, unspecified whether sciatica present     Plan: A discussion was had with her daughter by phone.  Her daughter is worried about her mom's instability and therefore I will order physical therapy.  She also would like her mom to get an injection into the left shoulder today.  We briefly discussed a short course of steroids but her daughter would prefer physical therapy first.  She will follow-up in 1 month.  If she had continued back pain we would order an MRI with referral for epidural steroid injection.  Her daughter thinks she may have had these in the past many years ago and did not find them particularly helpful.  Follow-Up Instructions: No follow-ups on file.   Ortho Exam  Patient is alert, oriented, no adenopathy, well-dressed, normal affect, normal respiratory effort. She has good range of motion with extension forward elevation of her left shoulder she can go behind her back.  Resistive exercises are somewhat painful for her.  Focused examination of her lumbar spine.  She has no obvious deformity she go she traces her pain down her left buttock down her leg.  She has a positive  straight leg raise.  She has good strength with dorsiflexion and plantarflexion of her foot.  She has no significant pain with forward flexion and extension.  Imaging: No results found. No images are attached to the encounter.  Labs: Lab Results  Component Value Date   HGBA1C 6.0 (H) 03/07/2018   HGBA1C 6.4 (H) 09/04/2017   HGBA1C 9.1 (H) 03/22/2017   ESRSEDRATE 5 08/23/2012   ESRSEDRATE 16 07/18/2011   ESRSEDRATE 11 07/08/2010   REPTSTATUS 05/23/2019 FINAL 05/20/2019   CULT >=100,000 COLONIES/mL ESCHERICHIA COLI (A) 05/20/2019   LABORGA ESCHERICHIA COLI (A) 05/20/2019     Lab Results  Component Value Date   ALBUMIN 4.1 05/20/2019   ALBUMIN 4.5 03/04/2019   ALBUMIN 4.4 12/04/2018    Lab Results  Component Value Date   MG 2.0 04/24/2017   MG 2.0 04/23/2017   MG 2.0 06/29/2015   Lab Results  Component Value Date   VD25OH 52 05/16/2012    No results found for: PREALBUMIN CBC EXTENDED Latest Ref Rng & Units 05/20/2019 03/04/2019 12/13/2018  WBC 4.0 - 10.5 K/uL 6.1 7.0 8.0  RBC 3.87 - 5.11 MIL/uL 4.23 4.39 4.25  HGB 12.0 - 15.0 g/dL 13.2 13.7 13.4  HCT 36 - 46 % 40.9 42.2 40.9  PLT  150 - 400 K/uL 199 207 211  NEUTROABS 1.7 - 7.7 K/uL 3.3 4.0 5.4  LYMPHSABS 0.7 - 4.0 K/uL 1.8 2.2 1.6     There is no height or weight on file to calculate BMI.  Orders:  Orders Placed This Encounter  Procedures   XR Lumbar Spine 2-3 Views   No orders of the defined types were placed in this encounter.    Procedures: Large Joint Inj: L subacromial bursa on 09/25/2019 5:16 PM Indications: diagnostic evaluation and pain Details: 22 G 1.5 in needle, posterior approach  Arthrogram: No  Medications: 5 mL lidocaine 1 %; 40 mg methylPREDNISolone acetate 40 MG/ML Outcome: tolerated well, no immediate complications Procedure, treatment alternatives, risks and benefits explained, specific risks discussed. Consent was given by the patient.      Clinical Data: No additional  findings.  ROS:  All other systems negative, except as noted in the HPI. Review of Systems  Objective: Vital Signs: There were no vitals taken for this visit.  Specialty Comments:  No specialty comments available.  PMFS History: Patient Active Problem List   Diagnosis Date Noted   Diabetic neuropathy ([place]) 01/28/2019   Diabetes mellitus due to underlying condition, uncontrolled ([place]) 09/04/2017   History of rectal bleeding 09/04/2017   A-fib ([place]) 04/23/2017   Diabetes mellitus type 2 in nonobese ([place]) 04/23/2017   Sjogren's disease ([place]) 04/03/2016   Primary osteoarthritis of both hands 04/03/2016   High risk medication use 04/03/2016   Senile dementia, with behavioral disturbance ([place]) 08/16/2015   Swelling 37/90/2409   Diastolic dysfunction 73/53/2992   Hypertonicity, bladder 07/08/2015   Arterial hypotension    Bradycardia 06/29/2015   Chronic lower back pain 06/29/2015   PAF (paroxysmal atrial fibrillation) ([place]) 04/18/2015   Dehydration 04/18/2015   Dementia without behavioral disturbance ([place]) 04/18/2015   TIA (transient ischemic attack) 12/17/2014   Chest pain 12/16/2014   Acute encephalopathy 12/04/2014   Fall    AKI (acute kidney injury) ([place]) 12/03/2014   History of cerebrovascular disease 09/24/2014   Falls frequently 07/28/2014   Infarction of parietal lobe (HCC)    Mild dementia ([place])    CVA (cerebral infarction) 07/26/2014   Atrial fibrillation with RVR ([place]) 07/03/2014   Constipation 04/15/2014   Mixed stress and urge urinary incontinence 11/03/2013   Therapeutic opioid induced constipation 11/03/2013   Hemorrhoid 11/03/2013   Low back pain associated with a spinal disorder other than radiculopathy or spinal stenosis 11/03/2013   Protein-calorie malnutrition, severe ([place]) 07/22/2013   UTI (lower urinary tract infection) 07/20/2013   Prolonged QT interval 07/20/2013   Osteopenia 07/17/2013   Palpitations  06/30/2013   Hereditary and idiopathic peripheral neuropathy 05/22/2013   Abnormality of gait 12/05/2012   Headache(784.0) 09/05/2012   Multinodular thyroid 01/15/2012   Neck pain 01/15/2012   Hypercholesterolemia 07/08/2010   Tear film insufficiency 07/06/2009   SJOGREN'S SYNDROME 07/06/2009   Urge urinary incontinence 01/06/2009   COLONIC POLYPS, ADENOMATOUS, HX OF 04/15/2008   MITRAL VALVE PROLAPSE 11/05/2007   ANXIETY DEPRESSION 07/02/2007   Mononeuritis 07/02/2007   HYPERTENSION, BENIGN 07/02/2007   GERD 07/02/2007   Irritable bowel syndrome 07/02/2007   Fibromyalgia 07/02/2007   Past Medical History:  Diagnosis Date   Abnormality of gait    Adenomatous polyp of colon 2002   49mm   Allergic rhinitis    Anxiety    Anxiety and depression    Chronic back pain    Dementia without behavioral disturbance ([place])    Depression  Diabetes ([place])    Diverticulosis of colon    Dry eye syndrome    Dysphagia    Dysthymic disorder    Fibromyalgia    GERD (gastroesophageal reflux disease)    H/O hiatal hernia    History of adverse drug reaction    History of cerebrovascular disease 09/24/2014   History of recurrent UTIs    Hypertension, benign    Irritable bowel syndrome    Low back pain syndrome    Memory loss    Mitral valve prolapse    Paroxysmal A-fib (HCC)    Peripheral neuropathy    "both feet and legs"   Physical deconditioning    Sjogren's syndrome (HCC)    Therapeutic opioid-induced constipation (OIC)    Thyroid nodule    Urinary incontinence     Family History  Problem Relation Age of Onset   Heart disease Father        heart attack   Pneumonia Father    Heart attack Mother    Hypertension Mother    Colon cancer Sister    Kidney disease Daughter    Asthma Daughter    Arthritis Daughter 57       osteo,   Heart disease Son 42       stage 3 CHF(Diastolic /Systolic)   Throat cancer Brother     Hypertension Maternal Grandmother     Past Surgical History:  Procedure Laterality Date   ABDOMINAL HYSTERECTOMY  1967   APPENDECTOMY     CARDIAC CATHETERIZATION  02/17/2003   normal L main, LAD free of disease, Cfx free of disease, RCA free of disease (Dr. Loni Muse. [place])   CATARACT EXTRACTION, BILATERAL     CHOLECYSTECTOMY  2000   COLONOSCOPY W/ BIOPSIES     multiple   DENTAL SURGERY     multiple tooth extractions   ESOPHAGOGASTRODUODENOSCOPY (EGD) WITH ESOPHAGEAL DILATION N/A 08/23/2012   Procedure: ESOPHAGOGASTRODUODENOSCOPY (EGD) WITH ESOPHAGEAL DILATION;  Surgeon: Milus Banister, MD;  Location: [facility];  Service: Endoscopy;  Laterality: N/A;   NASAL SEPTUM SURGERY  1980   NM MYOCAR PERF WALL MOTION  2003   persantine - normal static and dynamic study w/apical thinning and presvered LV function, no ischemia   SINUS EXPLORATION     ossifiying fibroma   TEMPOROMANDIBULAR JOINT SURGERY  1986   Dr. Terence Lux   TRANSTHORACIC ECHOCARDIOGRAM  2001   mild LVH, normal LV   Social History   Occupational History   Occupation: Retired  Tobacco Use   Smoking status: Former Smoker   Smokeless tobacco: Never Used   Tobacco comment: Quit at age 67   Vaping Use   Vaping Use: Never used  Substance and Sexual Activity   Alcohol use: No   Drug use: Never   Sexual activity: Never

## 2019-10-01 ENCOUNTER — Other Ambulatory Visit: Payer: Self-pay | Admitting: Internal Medicine

## 2019-10-02 ENCOUNTER — Encounter: Payer: Self-pay | Admitting: Internal Medicine

## 2019-10-02 ENCOUNTER — Other Ambulatory Visit: Payer: Self-pay

## 2019-10-02 ENCOUNTER — Ambulatory Visit (INDEPENDENT_AMBULATORY_CARE_PROVIDER_SITE_OTHER): Payer: Medicare Other | Admitting: Internal Medicine

## 2019-10-02 VITALS — BP 122/78 | HR 72 | Temp 98.1°F | Ht 63.0 in | Wt 153.1 lb

## 2019-10-02 DIAGNOSIS — R140 Abdominal distension (gaseous): Secondary | ICD-10-CM | POA: Diagnosis not present

## 2019-10-02 DIAGNOSIS — E0865 Diabetes mellitus due to underlying condition with hyperglycemia: Secondary | ICD-10-CM

## 2019-10-02 DIAGNOSIS — K582 Mixed irritable bowel syndrome: Secondary | ICD-10-CM

## 2019-10-02 DIAGNOSIS — K08109 Complete loss of teeth, unspecified cause, unspecified class: Secondary | ICD-10-CM

## 2019-10-02 DIAGNOSIS — M3501 Sicca syndrome with keratoconjunctivitis: Secondary | ICD-10-CM

## 2019-10-02 DIAGNOSIS — R350 Frequency of micturition: Secondary | ICD-10-CM

## 2019-10-02 DIAGNOSIS — F0150 Vascular dementia without behavioral disturbance: Secondary | ICD-10-CM

## 2019-10-02 NOTE — Progress Notes (Signed)
Location:  [facility] clinic Provider: Argie Applegate L. Mariea Clonts, D.O., C.M.D.  Code Status: DNR Goals of Care:  Advanced Directives 10/02/2019  Does Patient Have a Medical Advance Directive? Yes  Type of Advance Directive [place]  Does patient want to make changes to medical advance directive? No - Patient declined  Copy of [place] in Chart? -  Would patient like information on creating a medical advance directive? -  Pre-existing out of facility DNR order (yellow form or pink MOST form) -     Chief Complaint  Patient presents with  . Acute Visit    Possible UTI     HPI: Patient is a 86 y.o. female seen today for an acute visit for possible UTI.   C/o pee being dark, more incontinence (leaks out when stands), and sometimes dysuria.    Had left shoulder injection.  She's now to go to PT for her balance and pain.  She went to rheumatology.  C/o meat in her mouth bothering her.  She has sjogren's syndrome.  She uses biotene mouthwash.    Discussed increasing water more.  Getting about 32 oz water.    Has had IBS with mostly constipation.    Continues to be alone in her home at night and on weekends and falls during that time.    Past Medical History:  Diagnosis Date  . Abnormality of gait   . Adenomatous polyp of colon 2002   45mm  . Allergic rhinitis   . Anxiety   . Anxiety and depression   . Chronic back pain   . Dementia without behavioral disturbance ([place])   . Depression   . Diabetes ([place])   . Diverticulosis of colon   . Dry eye syndrome   . Dysphagia   . Dysthymic disorder   . Fibromyalgia   . GERD (gastroesophageal reflux disease)   . H/O hiatal hernia   . History of adverse drug reaction   . History of cerebrovascular disease 09/24/2014  . History of recurrent UTIs   . Hypertension, benign   . Irritable bowel syndrome   . Low back pain syndrome   . Memory loss   . Mitral valve prolapse   . Paroxysmal A-fib ([place])   .  Peripheral neuropathy    "both feet and legs"  . Physical deconditioning   . Sjogren's syndrome ([place])   . Therapeutic opioid-induced constipation (OIC)   . Thyroid nodule   . Urinary incontinence     Past Surgical History:  Procedure Laterality Date  . ABDOMINAL HYSTERECTOMY  1967  . APPENDECTOMY    . CARDIAC CATHETERIZATION  02/17/2003   normal L main, LAD free of disease, Cfx free of disease, RCA free of disease (Dr. Loni Muse. Little)  . CATARACT EXTRACTION, BILATERAL    . CHOLECYSTECTOMY  2000  . COLONOSCOPY W/ BIOPSIES     multiple  . DENTAL SURGERY     multiple tooth extractions  . ESOPHAGOGASTRODUODENOSCOPY (EGD) WITH ESOPHAGEAL DILATION N/A 08/23/2012   Procedure: ESOPHAGOGASTRODUODENOSCOPY (EGD) WITH ESOPHAGEAL DILATION;  Surgeon: Milus Banister, MD;  Location: [facility];  Service: Endoscopy;  Laterality: N/A;  . NASAL SEPTUM SURGERY  1980  . NM MYOCAR PERF WALL MOTION  2003   persantine - normal static and dynamic study w/apical thinning and presvered LV function, no ischemia  . SINUS EXPLORATION     ossifiying fibroma  . TEMPOROMANDIBULAR JOINT SURGERY  1986   Dr. Terence Lux  . TRANSTHORACIC ECHOCARDIOGRAM  2001  mild LVH, normal LV    Allergies  Allergen Reactions  . Banana Nausea And Vomiting  . Codeine Nausea Only    unless given with Phenergan  . Klonopin [Clonazepam] Other (See Comments)    Causes hallucination   . Meperidine Hcl Nausea Only    unless given with Phenergan  . Norflex [Orphenadrine Citrate] Nausea Only    Unless given with Phenergan  . Oxycodone-Acetaminophen Nausea Only    unless given with phenergan  . Propoxyphene Hcl Nausea Only    unless given with phenergan  . Zoloft [Sertraline Hcl] Other (See Comments)    Caused lethargy  . Doxycycline Other (See Comments)    Unknown reaction  . Naproxen Other (See Comments)    Unknown reaction  . Penicillins Other (See Comments)    Unknown reaction Has patient had a PCN reaction causing  immediate rash, facial/tongue/throat swelling, SOB or lightheadedness with hypotension: Unknown Has patient had a PCN reaction causing severe rash involving mucus membranes or skin necrosis: Unknown Has patient had a PCN reaction that required hospitalization: pt was in the hospital at time of reaction Has patient had a PCN reaction occurring within the last 10 years: Unknown If all of the above answers are "NO", then may proceed with Cephalos  . Phenothiazines Other (See Comments)    Unknown reaction  . Stelazine Other (See Comments)    Unknown reaction  . Sulfamethoxazole-Trimethoprim Other (See Comments)    Unknown reaction  . Tolectin [Tolmetin Sodium] Other (See Comments)    Unknown reaction  . Tramadol Other (See Comments)    Unknown reaction    Outpatient Encounter Medications as of 10/02/2019  Medication Sig  . acetaminophen (TYLENOL) 500 MG tablet Take 1,000 mg by mouth 2 (two) times daily.  Marland Kitchen ALPRAZolam (XANAX) 0.25 MG tablet Take 1 tablet by mouth twice daily as needed  . antiseptic oral rinse (BIOTENE) LIQD 15 mLs by Mouth Rinse route 5 (five) times daily as needed for dry mouth.  Marland Kitchen aspirin 325 MG tablet Take 1 tablet (325 mg total) by mouth daily.  . Capsaicin-Menthol-Methyl Sal (CAPSAICIN-METHYL SAL-MENTHOL) 0.025-1-12 % CREA Apply 1 application topically 2 (two) times daily.  . carboxymethylcellulose (REFRESH TEARS) 0.5 % SOLN Place 1 drop into both eyes 5 (five) times daily as needed (dry eyes).   . ciprofloxacin (CIPRO) 500 MG tablet Take 1 tablet (500 mg total) by mouth 2 (two) times daily.  Marland Kitchen CRANBERRY PO Take 2 tablets by mouth daily.  Marland Kitchen diltiazem (CARDIZEM CD) 180 MG 24 hr capsule TAKE 1 CAPSULE BY MOUTH ONCE DAILY  . docusate sodium (COLACE) 100 MG capsule Take 2 capsules (200 mg total) by mouth daily.  . DULoxetine (CYMBALTA) 30 MG capsule Take one capsule by mouth once daily for back pain and for depression.  . DULoxetine (CYMBALTA) 60 MG capsule Take 1 capsule  (60 mg total) by mouth daily.  . furosemide (LASIX) 40 MG tablet Take 1 tablet by mouth once daily  . hydrocortisone (ANUSOL-HC) 2.5 % rectal cream Place 1 application rectally daily. Use for 10 days  . JANUVIA 100 MG tablet Take 1 tablet by mouth once daily  . lansoprazole (PREVACID) 30 MG capsule Take one capsule by mouth once daily at noon  . linaclotide (LINZESS) 290 MCG CAPS capsule TAKE 1 CAPSULE BY MOUTH EVERY DAY AT BEDTIME  . memantine (NAMENDA) 10 MG tablet Take 1 tablet by mouth twice daily  . mupirocin ointment (BACTROBAN) 2 % Place 1 application into the nose  2 (two) times daily. Right nostril until soreness resolves  . MYRBETRIQ 50 MG TB24 tablet Take 1 tablet by mouth once daily  . NIACIN PO Take by mouth daily.  Marland Kitchen nystatin (MYCOSTATIN) 100000 UNIT/ML suspension Take 5 mLs (500,000 Units total) by mouth 4 (four) times daily. Until soreness of mouth resolves  . ONE TOUCH LANCETS MISC Use to test blood sugar twice daily.  Dx: E11.9  . ONETOUCH VERIO test strip USE ONE STRIP TO CHECK GLUCOSE TWICE DAILY AS DIRECTED  . pilocarpine (SALAGEN) 5 MG tablet Take 1 tablet by mouth twice daily  . polyethylene glycol (MIRALAX / GLYCOLAX) packet Take by mouth daily as needed (constipation). Mix 1/2 capful of miralax in 8 oz liquid and drink  . potassium chloride SA (KLOR-CON) 20 MEQ tablet Take 1 tablet (20 mEq total) by mouth daily.  . pregabalin (LYRICA) 100 MG capsule Take 1 capsule (100 mg total) by mouth 2 (two) times daily.  Marland Kitchen saccharomyces boulardii (FLORASTOR) 250 MG capsule Take 1 capsule (250 mg total) by mouth 2 (two) times daily.  . [DISCONTINUED] cephALEXin (KEFLEX) 500 MG capsule Take 1 capsule (500 mg total) by mouth 2 (two) times daily.   No facility-administered encounter medications on file as of 10/02/2019.    Review of Systems:  Review of Systems  Constitutional: Positive for malaise/fatigue. Negative for chills and fever.  Eyes: Negative for blurred vision.       Dry  eyes  Respiratory: Negative for cough and shortness of breath.   Cardiovascular: Negative for chest pain, palpitations and leg swelling.  Gastrointestinal: Positive for constipation. Negative for abdominal pain, blood in stool, diarrhea and melena.       Chronic bloating  Genitourinary: Positive for dysuria.       Incontinence reportedly worse (hard to know when she's a poor historian and uses the bathroom alone)  Musculoskeletal: Positive for back pain. Negative for falls.  Neurological: Negative for dizziness and loss of consciousness.  Psychiatric/Behavioral: Positive for depression and memory loss.       Bipolar disorder, dementia    Health Maintenance  Topic Date Due  . OPHTHALMOLOGY EXAM  Never done  . HEMOGLOBIN A1C  09/05/2018  . MAMMOGRAM  09/12/2019  . INFLUENZA VACCINE  09/14/2019  . FOOT EXAM  01/28/2020  . TETANUS/TDAP  12/09/2023  . DEXA SCAN  Completed  . COVID-19 Vaccine  Completed  . PNA vac Low Risk Adult  Completed  . URINE MICROALBUMIN  Discontinued    Physical Exam: Vitals:   10/02/19 1335  BP: 122/78  Pulse: 72  Temp: 98.1 F (36.7 C)  TempSrc: Temporal  SpO2: 97%  Weight: 153 lb 1.6 oz (69.4 kg)  Height: 5\' 3"  (1.6 m)   Body mass index is 27.12 kg/m. Physical Exam Vitals reviewed.  Constitutional:      General: She is not in acute distress.    Appearance: Normal appearance. She is not toxic-appearing.  HENT:     Head: Normocephalic and atraumatic.  Eyes:     Conjunctiva/sclera: Conjunctivae normal.     Pupils: Pupils are equal, round, and reactive to light.  Cardiovascular:     Rate and Rhythm: Normal rate and regular rhythm.     Pulses: Normal pulses.     Heart sounds: Normal heart sounds.  Pulmonary:     Effort: Pulmonary effort is normal.     Breath sounds: Normal breath sounds. No wheezing, rhonchi or rales.  Abdominal:     General: Bowel sounds  are normal. There is distension.     Palpations: Abdomen is soft.     Tenderness:  There is no abdominal tenderness. There is no guarding or rebound.  Musculoskeletal:        General: Normal range of motion.     Cervical back: Neck supple.     Right lower leg: No edema.     Left lower leg: No edema.  Skin:    General: Skin is warm and dry.     Coloration: Skin is pale.     Comments: Ecchymoses that caregiver has pictures of on pt's  thigh and arm have healed  Neurological:     General: No focal deficit present.     Mental Status: She is alert.     Motor: No weakness.     Gait: Gait abnormal.     Comments: Uses rollator walker  Psychiatric:     Comments: Irritable and rolling her eyes about caregiver, telling stories from long ago like usual     Labs reviewed: Basic Metabolic Panel: Recent Labs    12/13/18 1048 03/04/19 1505 05/20/19 1646  NA 140 138 139  K 3.8 4.1 4.3  CL 105 102 103  CO2 27 26 26   GLUCOSE 120* 118* 110*  BUN 27* 25* 17  CREATININE 0.72 0.78 0.55  CALCIUM 9.6 9.9 9.5   Liver Function Tests: Recent Labs    12/04/18 1245 03/04/19 1505 05/20/19 1646  AST 17 24 21   ALT 16 22 26   ALKPHOS 77 89 90  BILITOT 0.4 0.7 0.4  PROT 7.6 8.1 7.4  ALBUMIN 4.4 4.5 4.1   Recent Labs    03/04/19 1505 05/20/19 1646  LIPASE 32 40   No results for input(s): AMMONIA in the last 8760 hours. CBC: Recent Labs    12/13/18 1048 03/04/19 1505 05/20/19 1646  WBC 8.0 7.0 6.1  NEUTROABS 5.4 4.0 3.3  HGB 13.4 13.7 13.2  HCT 40.9 42.2 40.9  MCV 96.2 96.1 96.7  PLT 211 207 199   Lipid Panel: No results for input(s): CHOL, HDL, LDLCALC, TRIG, CHOLHDL, LDLDIRECT in the last 8760 hours. Lab Results  Component Value Date   HGBA1C 6.0 (H) 03/07/2018    Procedures since last visit: XR Lumbar Spine 2-3 Views  Result Date: 09/25/2019 X-rays of her lumbar spine due to demonstrate degenerative changes at L5-S1 with spurring.  Overall well-maintained alignment.   Assessment/Plan 1. Urinary frequency - not really clear if this is actually worse  or dysuria is any different b/c she uses the restroom herself--recommended she get help, but pt declines assistance by her caregiver and family still does not have anyone there on weekends when resident frequently falls - Urine Culture - Urinalysis, Routine w reflex microscopic  2. Abdominal distention -chronic, has IBS with constipation  3. Vascular dementia without behavioral disturbance ([place]) -needs 24x7 supervision and more help with toileting, bathing to prevent falls and infections  4. Diabetes mellitus due to underlying condition, controlled, with hyperglycemia ([place]) - was previously uncontrolled, now controlled Lab Results  Component Value Date   HGBA1C 6.0 (H) 03/07/2018  -caregiver is having challenges feeding her low carb foods that are also ok with her absence of teeth -recommended she find soft foods even if not diabetic friendly give pt's advanced age--more important that she eats something  - Amb ref to Medical Nutrition Therapy-MNT  5. Edentulous - has longstanding problems from dentures and w/o teeth - Amb ref to Medical Nutrition Therapy-MNT  6. Irritable  bowel syndrome with both constipation and diarrhea - more constipation -cont bowel regimen, hydration, staying active - abdominal distention has been evaluated numerous times w/o findings related to her symptoms - Amb ref to Medical Nutriti- on Therapy-MNT  7. Sjogren's syndrome with keratoconjunctivitis sicca ([place]) -encouraged hydration, biotene - Amb ref to Medical Nutrition Therapy-MNT  Labs/tests ordered:   Lab Orders     Urine Culture     MICROSCOPIC MESSAGE     Urinalysis, Routine w reflex microscopic  Next appt:  11/24/2019  Aarian Griffie L. Sativa Gelles, D.O. [facility] [address], [place] [address] Cell Phone (Mon-Fri 8am-5pm):  [phone] On Call:  [phone] & follow prompts after 5pm & weekends Office Phone:  [phone] Office Fax:   [phone]

## 2019-10-03 ENCOUNTER — Ambulatory Visit: Payer: Medicare Other | Admitting: Physical Therapy

## 2019-10-03 NOTE — Progress Notes (Signed)
Waiting for urine culture.  Urine sample is not grossly infected.

## 2019-10-05 LAB — URINE CULTURE
MICRO NUMBER:: 10848629
SPECIMEN QUALITY:: ADEQUATE

## 2019-10-05 LAB — URINALYSIS, ROUTINE W REFLEX MICROSCOPIC
Bacteria, UA: NONE SEEN /HPF
Bilirubin Urine: NEGATIVE
Glucose, UA: NEGATIVE
Hgb urine dipstick: NEGATIVE
Hyaline Cast: NONE SEEN /LPF
Ketones, ur: NEGATIVE
Nitrite: NEGATIVE
Protein, ur: NEGATIVE
Specific Gravity, Urine: 1.022 (ref 1.001–1.03)
pH: <=5 (ref 5.0–8.0)

## 2019-10-06 ENCOUNTER — Other Ambulatory Visit: Payer: Self-pay | Admitting: Internal Medicine

## 2019-10-06 DIAGNOSIS — N3000 Acute cystitis without hematuria: Secondary | ICD-10-CM

## 2019-10-06 MED ORDER — CEFACLOR 500 MG PO CAPS: 500.0000 mg | ORAL_CAPSULE | Freq: Three times a day (TID) | ORAL | 0 refills | Status: DC

## 2019-10-06 NOTE — Progress Notes (Signed)
I sent a Rx for cefaclor 500mg  three times daily for one week to [facility] for UTI.  If her behaviors do not change, let me know.

## 2019-10-15 DIAGNOSIS — Z6827 Body mass index (BMI) 27.0-27.9, adult: Secondary | ICD-10-CM | POA: Diagnosis not present

## 2019-10-15 DIAGNOSIS — Z01419 Encounter for gynecological examination (general) (routine) without abnormal findings: Secondary | ICD-10-CM | POA: Diagnosis not present

## 2019-10-15 DIAGNOSIS — Z139 Encounter for screening, unspecified: Secondary | ICD-10-CM | POA: Diagnosis not present

## 2019-10-22 ENCOUNTER — Ambulatory Visit: Payer: Medicare Other | Admitting: Physical Therapy

## 2019-10-28 ENCOUNTER — Ambulatory Visit (INDEPENDENT_AMBULATORY_CARE_PROVIDER_SITE_OTHER): Payer: Medicare Other | Admitting: Physician Assistant

## 2019-10-28 ENCOUNTER — Encounter: Payer: Self-pay | Admitting: Orthopedic Surgery

## 2019-10-28 DIAGNOSIS — M545 Low back pain: Secondary | ICD-10-CM | POA: Diagnosis not present

## 2019-10-28 DIAGNOSIS — M5440 Lumbago with sciatica, unspecified side: Secondary | ICD-10-CM

## 2019-10-28 NOTE — Progress Notes (Signed)
Office Visit Note   Patient: Ariana [name]           Date of Birth: May 26, 1929           MRN: [id number] Visit Date: 10/28/2019              Requested by: Gayland Curry, DO [place],  [place] [address] PCP: Gayland Curry, DO  Chief Complaint  Patient presents with  . Follow-up      HPI: This is a pleasant woman who I saw about a month ago complaining of left shoulder pain and left lower back pain radiating down her leg.  At that time I spoke with her daughter and we agreed on a course of physical therapy.  It is unclear to me but it seems as though she started this but because she could not make one of her visits this was canceled.  She still continues to have shoulder pain and is wondering if she can have 1 more injection.  Last injection just helped for a few days.  Also wanting to know what the next step is on her lower back  Assessment & Plan: Visit Diagnoses:  1. Acute left-sided low back pain with sciatica, sciatica laterality unspecified     Plan: I have talked with her caregiver that is here with her today.  We will go forward with an MRI of her back followed by follow-up with Dr. Ernestina Patches for possible ESI.  Her daughter did tell me she has had these before and done okay.  I will give her another shoulder injection today.  I will also reorder physical therapy for her shoulder and her balance  Follow-Up Instructions: No follow-ups on file.   Ortho Exam  Patient is alert, oriented, no adenopathy, well-dressed, normal affect, normal respiratory effort. Left shoulder she has tenderness with forward elevation and internal rotation behind her back.  No swelling no acute changes since last visit.  Her lower back she has pain in the left buttock that radiates down the left leg and knee.  She has good plantar flexion and dorsiflexion strength mild straight leg raise  Imaging: No results found. No images are attached to the encounter.  Labs: Lab Results  Component  Value Date   HGBA1C 6.0 (H) 03/07/2018   HGBA1C 6.4 (H) 09/04/2017   HGBA1C 9.1 (H) 03/22/2017   ESRSEDRATE 5 08/23/2012   ESRSEDRATE 16 07/18/2011   ESRSEDRATE 11 07/08/2010   REPTSTATUS 05/23/2019 FINAL 05/20/2019   CULT >=100,000 COLONIES/mL ESCHERICHIA COLI (A) 05/20/2019   LABORGA ESCHERICHIA COLI (A) 05/20/2019     Lab Results  Component Value Date   ALBUMIN 4.1 05/20/2019   ALBUMIN 4.5 03/04/2019   ALBUMIN 4.4 12/04/2018    Lab Results  Component Value Date   MG 2.0 04/24/2017   MG 2.0 04/23/2017   MG 2.0 06/29/2015   Lab Results  Component Value Date   VD25OH 52 05/16/2012    No results found for: PREALBUMIN CBC EXTENDED Latest Ref Rng & Units 05/20/2019 03/04/2019 12/13/2018  WBC 4.0 - 10.5 K/uL 6.1 7.0 8.0  RBC 3.87 - 5.11 MIL/uL 4.23 4.39 4.25  HGB 12.0 - 15.0 g/dL 13.2 13.7 13.4  HCT 36 - 46 % 40.9 42.2 40.9  PLT 150 - 400 K/uL 199 207 211  NEUTROABS 1.7 - 7.7 K/uL 3.3 4.0 5.4  LYMPHSABS 0.7 - 4.0 K/uL 1.8 2.2 1.6     There is no height or weight on file  to calculate BMI.  Orders:  Orders Placed This Encounter  Procedures  . MR Lumbar Spine w/o contrast  . Ambulatory referral to Physical Therapy  . Ambulatory referral to Physical Medicine Rehab   No orders of the defined types were placed in this encounter.    Procedures: Large Joint Inj: L subacromial bursa on 10/28/2019 3:39 PM Indications: diagnostic evaluation and pain Details: 22 G 1.5 in needle, posterior approach  Arthrogram: No  Medications: 5 mL lidocaine 1 %; 40 mg methylPREDNISolone acetate 40 MG/ML Outcome: tolerated well, no immediate complications Procedure, treatment alternatives, risks and benefits explained, specific risks discussed. Consent was given by the patient.      Clinical Data: No additional findings.  ROS:  All other systems negative, except as noted in the HPI. Review of Systems  Objective: Vital Signs: There were no vitals taken for this  visit.  Specialty Comments:  No specialty comments available.  PMFS History: Patient Active Problem List   Diagnosis Date Noted  . Diabetic neuropathy ([place]) 01/28/2019  . Diabetes mellitus due to underlying condition, uncontrolled ([place]) 09/04/2017  . History of rectal bleeding 09/04/2017  . A-fib ([place]) 04/23/2017  . Diabetes mellitus type 2 in nonobese ([place]) 04/23/2017  . Sjogren's disease ([place]) 04/03/2016  . Primary osteoarthritis of both hands 04/03/2016  . High risk medication use 04/03/2016  . Senile dementia, with behavioral disturbance ([place]) 08/16/2015  . Swelling 08/16/2015  . Diastolic dysfunction 25/00/3704  . Hypertonicity, bladder 07/08/2015  . Arterial hypotension   . Bradycardia 06/29/2015  . Chronic lower back pain 06/29/2015  . PAF (paroxysmal atrial fibrillation) ([place]) 04/18/2015  . Dehydration 04/18/2015  . Dementia without behavioral disturbance ([place]) 04/18/2015  . TIA (transient ischemic attack) 12/17/2014  . Chest pain 12/16/2014  . Acute encephalopathy 12/04/2014  . Fall   . AKI (acute kidney injury) ([place]) 12/03/2014  . History of cerebrovascular disease 09/24/2014  . Falls frequently 07/28/2014  . Infarction of parietal lobe ([place])   . Mild dementia ([place])   . CVA (cerebral infarction) 07/26/2014  . Atrial fibrillation with RVR ([place]) 07/03/2014  . Constipation 04/15/2014  . Mixed stress and urge urinary incontinence 11/03/2013  . Therapeutic opioid induced constipation 11/03/2013  . Hemorrhoid 11/03/2013  . Low back pain associated with a spinal disorder other than radiculopathy or spinal stenosis 11/03/2013  . Protein-calorie malnutrition, severe ([place]) 07/22/2013  . UTI (lower urinary tract infection) 07/20/2013  . Prolonged QT interval 07/20/2013  . Osteopenia 07/17/2013  . Palpitations 06/30/2013  . Hereditary and idiopathic peripheral neuropathy 05/22/2013  . Abnormality of gait 12/05/2012  . Headache(784.0) 09/05/2012  . Multinodular thyroid  01/15/2012  . Neck pain 01/15/2012  . Hypercholesterolemia 07/08/2010  . Tear film insufficiency 07/06/2009  . [place] SYNDROME 07/06/2009  . Urge urinary incontinence 01/06/2009  . COLONIC POLYPS, ADENOMATOUS, HX OF 04/15/2008  . MITRAL VALVE PROLAPSE 11/05/2007  . ANXIETY DEPRESSION 07/02/2007  . Mononeuritis 07/02/2007  . HYPERTENSION, BENIGN 07/02/2007  . GERD 07/02/2007  . Irritable bowel syndrome 07/02/2007  . Fibromyalgia 07/02/2007   Past Medical History:  Diagnosis Date  . Abnormality of gait   . Adenomatous polyp of colon 2002   26mm  . Allergic rhinitis   . Anxiety   . Anxiety and depression   . Chronic back pain   . Dementia without behavioral disturbance ([place])   . Depression   . Diabetes ([place])   . Diverticulosis of colon   . Dry eye syndrome   . Dysphagia   .  Dysthymic disorder   . Fibromyalgia   . GERD (gastroesophageal reflux disease)   . H/O hiatal hernia   . History of adverse drug reaction   . History of cerebrovascular disease 09/24/2014  . History of recurrent UTIs   . Hypertension, benign   . Irritable bowel syndrome   . Low back pain syndrome   . Memory loss   . Mitral valve prolapse   . Paroxysmal A-fib ([place])   . Peripheral neuropathy    "both feet and legs"  . Physical deconditioning   . Sjogren's syndrome ([place])   . Therapeutic opioid-induced constipation ([facility])   . Thyroid nodule   . Urinary incontinence     Family History  Problem Relation Age of Onset  . Heart disease Father        heart attack  . Pneumonia Father   . Heart attack Mother   . Hypertension Mother   . Colon cancer Sister   . Kidney disease Daughter   . Asthma Daughter   . Arthritis Daughter 65       osteo,  . Heart disease Son 68       stage 3 CHF(Diastolic /Systolic)  . Throat cancer Brother   . Hypertension Maternal Grandmother     Past Surgical History:  Procedure Laterality Date  . ABDOMINAL HYSTERECTOMY  1967  . APPENDECTOMY    . CARDIAC  CATHETERIZATION  02/17/2003   normal L main, LAD free of disease, Cfx free of disease, RCA free of disease (Dr. Loni Muse. [place])  . CATARACT EXTRACTION, BILATERAL    . CHOLECYSTECTOMY  2000  . COLONOSCOPY W/ BIOPSIES     multiple  . DENTAL SURGERY     multiple tooth extractions  . ESOPHAGOGASTRODUODENOSCOPY (EGD) WITH ESOPHAGEAL DILATION N/A 08/23/2012   Procedure: ESOPHAGOGASTRODUODENOSCOPY (EGD) WITH ESOPHAGEAL DILATION;  Surgeon: Milus Banister, MD;  Location: [facility];  Service: Endoscopy;  Laterality: N/A;  . NASAL SEPTUM SURGERY  1980  . NM MYOCAR PERF WALL MOTION  2003   persantine - normal static and dynamic study w/apical thinning and presvered LV function, no ischemia  . SINUS EXPLORATION     ossifiying fibroma  . TEMPOROMANDIBULAR JOINT SURGERY  1986   Dr. Terence Lux  . TRANSTHORACIC ECHOCARDIOGRAM  2001   mild LVH, normal LV   Social History   Occupational History  . Occupation: Retired  Tobacco Use  . Smoking status: Former Research scientist (life sciences)  . Smokeless tobacco: Never Used  . Tobacco comment: Quit at age 83   Vaping Use  . Vaping Use: Never used  Substance and Sexual Activity  . Alcohol use: No  . Drug use: Never  . Sexual activity: Never

## 2019-10-29 ENCOUNTER — Other Ambulatory Visit: Payer: Self-pay | Admitting: Internal Medicine

## 2019-10-29 DIAGNOSIS — F341 Dysthymic disorder: Secondary | ICD-10-CM

## 2019-10-29 MED ORDER — ONETOUCH VERIO VI STRP: ORAL_STRIP | 3 refills | Status: DC

## 2019-10-29 MED ORDER — LANCETS MICRO THIN 33G MISC: 3 refills | Status: DC

## 2019-10-29 NOTE — Telephone Encounter (Signed)
Pharmacy and daughter requested refill Pended Rx and sent to [facility] for approval due to [place].

## 2019-10-31 MED ORDER — METHYLPREDNISOLONE ACETATE 40 MG/ML IJ SUSP
40.0000 mg | INTRAMUSCULAR | Status: AC | PRN
  Administered 2019-10-28: 40 mg via INTRA_ARTICULAR

## 2019-10-31 MED ORDER — LIDOCAINE HCL 1 % IJ SOLN
5.0000 mL | INTRAMUSCULAR | Status: AC | PRN
  Administered 2019-10-28: 5 mL

## 2019-11-03 ENCOUNTER — Other Ambulatory Visit: Payer: Self-pay | Admitting: Family

## 2019-11-03 ENCOUNTER — Other Ambulatory Visit: Payer: Self-pay | Admitting: Internal Medicine

## 2019-11-03 ENCOUNTER — Other Ambulatory Visit: Payer: Self-pay | Admitting: Rheumatology

## 2019-11-03 DIAGNOSIS — E119 Type 2 diabetes mellitus without complications: Secondary | ICD-10-CM

## 2019-11-03 DIAGNOSIS — M797 Fibromyalgia: Secondary | ICD-10-CM

## 2019-11-03 NOTE — Telephone Encounter (Signed)
Last Visit: 03/19/19 Next visit: 03/18/20  Okay to refill per Dr. Deveshwar 

## 2019-11-10 ENCOUNTER — Encounter: Payer: Medicare Other | Admitting: Family

## 2019-11-13 ENCOUNTER — Telehealth: Payer: Self-pay

## 2019-11-13 ENCOUNTER — Encounter: Payer: Self-pay | Admitting: Nurse Practitioner

## 2019-11-13 ENCOUNTER — Ambulatory Visit (INDEPENDENT_AMBULATORY_CARE_PROVIDER_SITE_OTHER): Payer: Medicare Other | Admitting: Nurse Practitioner

## 2019-11-13 ENCOUNTER — Other Ambulatory Visit: Payer: Self-pay

## 2019-11-13 DIAGNOSIS — Z0000 Encounter for general adult medical examination without abnormal findings: Secondary | ICD-10-CM

## 2019-11-13 NOTE — Progress Notes (Signed)
Subjective:   Ariana [name] is a 86 y.o. female who presents for Medicare Annual (Subsequent) preventive examination.  Review of Systems     Cardiac Risk Factors include: sedentary lifestyle;advanced age (>73men, >20 women);diabetes mellitus;dyslipidemia;hypertension     Objective:    There were no vitals filed for this visit. There is no height or weight on file to calculate BMI.  Advanced Directives 11/13/2019 10/02/2019 08/28/2019 07/23/2019 06/12/2019 05/20/2019 04/07/2019  Does Patient Have a Medical Advance Directive? Yes Yes Yes Yes Yes No Yes  Type of [place] administrator Power of [place] of [place] of [place]  Does patient want to make changes to medical advance directive? No - Patient declined No - Patient declined No - Patient declined No - Patient declined No - Patient declined - -  Copy of [place] in Chart? Yes - validated most recent copy scanned in chart (See row information) - Yes - validated most recent copy scanned in chart (See row information) No - copy requested Yes - validated most recent copy scanned in chart (See row information) - -  Would patient like information on creating a medical advance directive? - - - - - - -  Pre-existing out of facility DNR order (yellow form or pink MOST form) - - - - - - -    Current Medications (verified) Outpatient Encounter Medications as of 11/13/2019  Medication Sig   acetaminophen (TYLENOL) 500 MG tablet Take 1,000 mg by mouth 2 (two) times daily.   ALPRAZolam (XANAX) 0.25 MG tablet Take 1 tablet by mouth twice daily as needed   antiseptic oral rinse (BIOTENE) LIQD 15 mLs by Mouth Rinse route 5 (five) times daily as needed for dry mouth.   aspirin 325 MG tablet Take 1 tablet (325 mg total) by mouth daily.   Capsaicin-Menthol-Methyl Sal (CAPSAICIN-METHYL SAL-MENTHOL)  0.025-1-12 % CREA Apply 1 application topically 2 (two) times daily.   carboxymethylcellulose (REFRESH TEARS) 0.5 % SOLN Place 1 drop into both eyes 5 (five) times daily as needed (dry eyes).    CRANBERRY PO Take 2 tablets by mouth daily.   diltiazem (CARDIZEM CD) 180 MG 24 hr capsule TAKE 1 CAPSULE BY MOUTH ONCE DAILY   docusate sodium (COLACE) 100 MG capsule Take 2 capsules (200 mg total) by mouth daily.   DULoxetine (CYMBALTA) 30 MG capsule TAKE 1 CAPSULE BY MOUTH ONCE DAILY FOR BACK PAIN AND FOR DEPRESSION   DULoxetine (CYMBALTA) 60 MG capsule Take 1 capsule by mouth once daily   furosemide (LASIX) 40 MG tablet Take 1 tablet by mouth once daily   glucose blood (ONETOUCH VERIO) test strip Use to test blood sugar daily. Dx:E08.65   hydrocortisone (ANUSOL-HC) 2.5 % rectal cream Place 1 application rectally daily. Use for 10 days   JANUVIA 100 MG tablet Take 1 tablet by mouth once daily   Lancets Micro Thin 33G MISC Use to test blood sugar daily. Dx: E08.65   lansoprazole (PREVACID) 30 MG capsule Take one capsule by mouth once daily at noon   linaclotide (LINZESS) 290 MCG CAPS capsule TAKE 1 CAPSULE BY MOUTH EVERY DAY AT BEDTIME   memantine (NAMENDA) 10 MG tablet Take 1 tablet by mouth twice daily   MYRBETRIQ 50 MG TB24 tablet Take 1 tablet by mouth once daily   NIACIN PO Take by mouth daily.   pilocarpine (SALAGEN) 5 MG tablet Take 1 tablet  by mouth twice daily   polyethylene glycol (MIRALAX / GLYCOLAX) packet Take by mouth daily as needed (constipation). Mix 1/2 capful of miralax in 8 oz liquid and drink   potassium chloride SA (KLOR-CON) 20 MEQ tablet Take 1 tablet by mouth once daily   pregabalin (LYRICA) 100 MG capsule Take 1 capsule (100 mg total) by mouth 2 (two) times daily.   saccharomyces boulardii (FLORASTOR) 250 MG capsule Take 1 capsule (250 mg total) by mouth 2 (two) times daily.   cefaclor (CECLOR) 500 MG capsule Take 1 capsule (500 mg total) by mouth 3  (three) times daily.   [DISCONTINUED] ciprofloxacin (CIPRO) 500 MG tablet Take 1 tablet (500 mg total) by mouth 2 (two) times daily.   [DISCONTINUED] mupirocin ointment (BACTROBAN) 2 % Place 1 application into the nose 2 (two) times daily. Right nostril until soreness resolves   [DISCONTINUED] nystatin (MYCOSTATIN) 100000 UNIT/ML suspension Take 5 mLs (500,000 Units total) by mouth 4 (four) times daily. Until soreness of mouth resolves   No facility-administered encounter medications on file as of 11/13/2019.    Allergies (verified) Banana, Codeine, Klonopin [clonazepam], Meperidine hcl, Norflex [orphenadrine citrate], Oxycodone-acetaminophen, Propoxyphene hcl, Zoloft [sertraline hcl], Doxycycline, Naproxen, Penicillins, Phenothiazines, Stelazine, Sulfamethoxazole-trimethoprim, Tolectin [tolmetin sodium], and Tramadol   History: Past Medical History:  Diagnosis Date   Abnormality of gait    Adenomatous polyp of colon 2002   32mm   Allergic rhinitis    Anxiety    Anxiety and depression    Chronic back pain    Dementia without behavioral disturbance (HCC)    Depression    Diabetes ([place])    Diverticulosis of colon    Dry eye syndrome    Dysphagia    Dysthymic disorder    Fall    Fibromyalgia    GERD (gastroesophageal reflux disease)    H/O hiatal hernia    History of adverse drug reaction    History of cerebrovascular disease 09/24/2014   History of recurrent UTIs    Hypertension, benign    Irritable bowel syndrome    Low back pain syndrome    Memory loss    Mitral valve prolapse    Paroxysmal A-fib (HCC)    Peripheral neuropathy    "both feet and legs"   Physical deconditioning    Sjogren's syndrome (HCC)    Therapeutic opioid-induced constipation (OIC)    Thyroid nodule    Urinary incontinence    Past Surgical History:  Procedure Laterality Date   ABDOMINAL HYSTERECTOMY  1967   APPENDECTOMY     CARDIAC CATHETERIZATION  02/17/2003    normal L main, LAD free of disease, Cfx free of disease, RCA free of disease (Dr. Loni Muse. Little)   CATARACT EXTRACTION, BILATERAL     CHOLECYSTECTOMY  2000   COLONOSCOPY W/ BIOPSIES     multiple   DENTAL SURGERY     multiple tooth extractions   ESOPHAGOGASTRODUODENOSCOPY (EGD) WITH ESOPHAGEAL DILATION N/A 08/23/2012   Procedure: ESOPHAGOGASTRODUODENOSCOPY (EGD) WITH ESOPHAGEAL DILATION;  Surgeon: Milus Banister, MD;  Location: [facility];  Service: Endoscopy;  Laterality: N/A;   NASAL SEPTUM SURGERY  1980   NM MYOCAR PERF WALL MOTION  2003   persantine - normal static and dynamic study w/apical thinning and presvered LV function, no ischemia   SINUS EXPLORATION     ossifiying fibroma   TEMPOROMANDIBULAR JOINT SURGERY  1986   Dr. Terence Lux   TRANSTHORACIC ECHOCARDIOGRAM  2001   mild LVH, normal LV   Family History  Problem Relation Age of Onset   Heart disease Father        heart attack   Pneumonia Father    Heart attack Mother    Hypertension Mother    Colon cancer Sister    Kidney disease Daughter    Asthma Daughter    Arthritis Daughter 66       osteo,   Heart disease Son 17       stage 3 CHF(Diastolic /Systolic)   Throat cancer Brother    Hypertension Maternal Grandmother    Social History   Socioeconomic History   Marital status: Widowed    Spouse name: Not on file   Number of children: 2   Years of education: 11   Highest education level: Not on file  Occupational History   Occupation: Retired  Tobacco Use   Smoking status: Former Smoker   Smokeless tobacco: Never Used   Tobacco comment: Quit at age 40   Vaping Use   Vaping Use: Never used  Substance and Sexual Activity   Alcohol use: No   Drug use: Never   Sexual activity: Never  Other Topics Concern   Not on file  Social History Narrative   Patient lives at home 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    Patient is Widowed.    Patient has 2 children.    Patient is retired.     Former smoker   Alcohol none   Exercise Walk, exercise chair 4 days a week   POA    Walks with cane      Patient drinks about 1-2 cups of hot tea daily.   Patient is right handed.               Social Determinants of Health   Financial Resource Strain:    Difficulty of Paying Living Expenses: Not on file  Food Insecurity:    Worried About Charity fundraiser in the Last Year: Not on file   [employer] of Food in the Last Year: Not on file  Transportation Needs:    Lack of Transportation (Medical): Not on file   Lack of Transportation (Non-Medical): Not on file  Physical Activity:    Days of Exercise per Week: Not on file   Minutes of Exercise per Session: Not on file  Stress:    Feeling of Stress : Not on file  Social Connections:    Frequency of Communication with Friends and Family: Not on file   Frequency of Social Gatherings with Friends and Family: Not on file   Attends Religious Services: Not on file   Active Member of Clubs or Organizations: Not on file   Attends Archivist Meetings: Not on file   Marital Status: Not on file    Tobacco Counseling Counseling given: Not Answered Comment: Quit at age 53    Clinical Intake:  Pre-visit preparation completed: Yes  Pain : 0-10 Pain Type: Neuropathic pain Pain Location: Foot Pain Orientation: Lower Pain Descriptors / Indicators: Numbness Pain Onset: More than a month ago Pain Frequency: Constant     BMI - recorded: 27 Nutritional Status: BMI 25 -29 Overweight Nutritional Risks: Unintentional weight loss, Nausea/ vomitting/ diarrhea Diabetes: Yes  How often do you need to have someone help you when you read instructions, pamphlets, or other written materials from your doctor or pharmacy?: 3 - Sometimes  Diabetic?yes          Activities of Daily Living In your present state  of health, do you have any difficulty performing the following activities: 11/13/2019  Hearing? N  Vision?  Y  Difficulty concentrating or making decisions? Y  Comment trouble remembering  Walking or climbing stairs? Y  Comment neuropathy  Dressing or bathing? Y  Comment has a personal caregiver that helps her  Doing errands, shopping? Y  Preparing Food and eating ? Y  Comment caregiver helps her  Using the Toilet? N  In the past six months, have you accidently leaked urine? Y  Do you have problems with loss of bowel control? N  Managing your Medications? Y  Comment daughter helps her  Managing your Finances? Y  Comment daughter does Art gallery manager? Y  Some recent data might be hidden    Patient Care Team: Gayland Curry, DO as PCP - General (Geriatric Medicine) Noralee Space, MD as Consulting Physician (Pulmonary Disease) Maisie Fus, MD as Consulting Physician (Obstetrics and Gynecology) Debara Pickett Nadean Corwin, MD as Consulting Physician (Cardiology) Monna Fam, MD as Consulting Physician (Ophthalmology) Kathrynn Ducking, MD as Consulting Physician (Neurology) Franchot Gallo, MD as Consulting Physician (Urology) Lavonna Monarch, MD as Consulting Physician (Dermatology) Gatha Mayer, MD as Consulting Physician (Gastroenterology) Standley Brooking, LCSW as Social Worker  Indicate any recent [place] you may have received from other than [facility] providers in the past year (date may be approximate).     Assessment:   This is a routine wellness examination for Flynn.  Hearing/Vision screen  Hearing Screening   125Hz  250Hz  500Hz  1000Hz  2000Hz  3000Hz  4000Hz  6000Hz  8000Hz   Right ear:           Left ear:           Comments: Patient states she has no problems with hearing.  Vision Screening Comments: Patient states that she does have problems with her vision. Patient saw eye doctor about 2 to 3 months ago for eye exam.  Dietary issues and exercise activities discussed: Current Exercise Habits: The patient does not participate in  regular exercise at present  Goals   None    Depression Screen [facility] 2/9 Scores 11/13/2019 10/02/2019 08/28/2019 06/12/2019 03/04/2019 02/20/2019 11/08/2018  PHQ - 2 Score 0 0 0 0 0 0 0  PHQ- 9 Score - - - - - - -  Exception Documentation - - - - - - -    Fall Risk Fall Risk  11/13/2019 10/02/2019 08/28/2019 07/23/2019 06/12/2019  Falls in the past year? 1 1 0 1 1  Number falls in past yr: 1 0 0 0 1  Injury with Fall? 1 1 0 1 1  Comment - hurt arm and leg - - -  Risk Factor Category  - - - - -  Risk for fall due to : - - - - -  Follow up - - - - -    Any stairs in or around the home? No  If so, are there any without handrails? No  Home free of loose throw rugs in walkways, pet beds, electrical cords, etc? Yes  Adequate lighting in your home to reduce risk of falls? Yes   ASSISTIVE DEVICES UTILIZED TO PREVENT FALLS:  Life alert? No  Use of a cane, walker or w/c? Yes  Grab bars in the bathroom? Yes  Shower chair or bench in shower? Yes  Elevated toilet seat or a handicapped toilet? Yes   TIMED UP AND GO:  Was the test performed? No .  Cognitive Function: MMSE - Mini Mental State Exam 11/08/2018 11/05/2017 04/10/2016 10/06/2015 10/06/2015  Orientation to time 4 4 3 3  -  Orientation to Place 4 5 5 5  -  Registration 3 3 3 3 3   Attention/ Calculation 5 5 5 5 5   Recall 0 0 1 3 -  Language- name 2 objects 2 2 2 2  -  Language- repeat 1 1 1 1  -  Language- follow 3 step command 3 3 1 3  -  Language- read & follow direction 1 1 1 1  -  Write a sentence 1 1 0 1 -  Copy design 1 1 1 1  -  Total score 25 26 23 28  -     6CIT Screen 11/13/2019  What Year? 4 points  What month? 3 points  What time? 0 points  Count back from 20 0 points  Months in reverse 4 points  Repeat phrase 10 points  Total Score 21    Immunizations Immunization History  Administered Date(s) Administered   Fluad Quad(high Dose 65+) 11/08/2018   Influenza Split 12/26/2010, 01/15/2012   Influenza Whole 12/30/2008,  11/15/2009   Influenza, High Dose Seasonal PF 12/21/2016, 11/05/2017   Influenza,inj,Quad PF,6+ Mos 02/04/2013, 11/03/2013, 12/05/2014, 12/13/2015   Moderna SARS-COVID-2 Vaccination 04/17/2019, 05/15/2019   Pneumococcal Conjugate-13 06/12/2014   Pneumococcal Polysaccharide-23 02/15/2009, 12/05/2014   Tdap 12/08/2013   Zoster 01/29/2012    TDAP status: Up to date FLU VACCINE DUE- to get at pharmacy or [place] Pneumococcal vaccine status: Up to date Covid-19 vaccine status: Completed vaccines  Qualifies for Shingles Vaccine? Yes   Zostavax completed Yes   Shingrix Completed?: No.    Education has been provided regarding the importance of this vaccine. Patient has been advised to call insurance company to determine out of pocket expense if they have not yet received this vaccine. Advised may also receive vaccine at local pharmacy or Health Dept. Verbalized acceptance and understanding.  Screening Tests Health Maintenance  Topic Date Due   OPHTHALMOLOGY EXAM  Never done   HEMOGLOBIN A1C  09/05/2018   MAMMOGRAM  09/12/2019   INFLUENZA VACCINE  09/14/2019   FOOT EXAM  01/28/2020   TETANUS/TDAP  12/09/2023   DEXA SCAN  Completed   COVID-19 Vaccine  Completed   PNA vac Low Risk Adult  Completed   URINE MICROALBUMIN  Discontinued    Health Maintenance  Health Maintenance Due  Topic Date Due   OPHTHALMOLOGY EXAM  Never done   HEMOGLOBIN A1C  09/05/2018   MAMMOGRAM  09/12/2019   INFLUENZA VACCINE  09/14/2019    Colorectal cancer screening: No longer required.  Mammogram status: No longer required.  Bone Density status: Ordered today. Pt provided with contact info and advised to call to schedule appt.  Lung Cancer Screening: (Low Dose CT Chest recommended if Age 52-80 years, 30 pack-year currently smoking OR have quit w/in 15years.) does not qualify.   Lung Cancer Screening Referral: na  Additional Screening:  Hepatitis C Screening: does not qualify;  Completed aged out  Vision Screening: Recommended annual ophthalmology exams for early detection of glaucoma and other disorders of the eye. Is the patient up to date with their annual eye exam?  Yes  Who is the provider or what is the name of the office in which the patient attends annual eye exams? Dr Herbert Deaner  If pt is not established with a provider, would they like to be referred to a provider to establish care? No .   Dental Screening: Recommended annual  dental exams for proper oral hygiene  Community Resource Referral / Chronic Care Management: CRR required this visit?  No   CCM required this visit?  No      Plan:     I have personally reviewed and noted the following in the patients chart:    Medical and social history  Use of alcohol, tobacco or illicit drugs   Current medications and supplements  Functional ability and status  Nutritional status  Physical activity  Advanced directives  List of other physicians  Hospitalizations, surgeries, and ER visits in previous 12 months  Vitals  Screenings to include cognitive, depression, and falls  Referrals and appointments  In addition, I have reviewed and discussed with patient certain preventive protocols, quality metrics, and best practice recommendations. A written personalized care plan for preventive services as well as general preventive health recommendations were provided to patient.     Lauree Chandler, NP   11/13/2019    Virtual Visit via Telephone Note  I connected with@ on 11/13/19 at  2:15 PM EDT by telephone and verified that I am speaking with the correct person using two identifiers.  Location: Patient: home Provider: [facility]   I discussed the limitations, risks, security and privacy concerns of performing an evaluation and management service by telephone and the availability of in person appointments. I also discussed with the patient that there may be a patient responsible  charge related to this service. The patient expressed understanding and agreed to proceed.   I discussed the assessment and treatment plan with the patient. The patient was provided an opportunity to ask questions and all were answered. The patient agreed with the plan and demonstrated an understanding of the instructions.   The patient was advised to call back or seek an in-person evaluation if the symptoms worsen or if the condition fails to improve as anticipated.  I provided 16 minutes of non-face-to-face time during this encounter.  Carlos American. Harle Battiest Avs printed and mailed

## 2019-11-13 NOTE — Progress Notes (Signed)
This service is provided via telemedicine  No vital signs collected/recorded due to the encounter was a telemedicine visit.   Location of patient (ex: home, work):  Home  Patient consents to a telephone visit:  Yes, see encounter dated 11/13/2019  Location of the provider (ex: office, home):  [place]  Name of any referring provider:  Hollace Kinnier, DO  Names of all persons participating in the telemedicine service and their role in the encounter:  Sherrie Mustache, Nurse Practitioner, Carroll Kinds, CMA, and patient.   Time spent on call:  32 minutes with medical assistant.

## 2019-11-13 NOTE — Patient Instructions (Signed)
Ariana [name] , Thank you for taking time to come for your Medicare Wellness Visit. I appreciate your ongoing commitment to your health goals. Please review the following plan we discussed and let me know if I can assist you in the future.   Screening recommendations/referrals: Colonoscopy aged out Mammogram up to date Bone Density recommended to update, please have them send your results to our office- [phone] Recommended yearly ophthalmology/optometry visit for glaucoma screening and checkup Recommended yearly dental visit for hygiene and checkup  Vaccinations: Influenza vaccine DUE at this time.  Pneumococcal vaccine up to date Tdap vaccine up to date Shingles vaccine RECOMMENDED to get shingrix at local pharmacy    Advanced directives: up to date  Conditions/risks identified: fall risk, progressive memory loss, nutritional risk factor  Next appointment: 1 year.    Preventive Care 41 Years and Older, Female Preventive care refers to lifestyle choices and visits with your health care provider that can promote health and wellness. What does preventive care include?  A yearly physical exam. This is also called an annual well check.  Dental exams once or twice a year.  Routine eye exams. Ask your health care provider how often you should have your eyes checked.  Personal lifestyle choices, including:  Daily care of your teeth and gums.  Regular physical activity.  Eating a healthy diet.  Avoiding tobacco and drug use.  Limiting alcohol use.  Practicing safe sex.  Taking low-dose aspirin every day.  Taking vitamin and mineral supplements as recommended by your health care provider. What happens during an annual well check? The services and screenings done by your health care provider during your annual well check will depend on your age, overall health, lifestyle risk factors, and family history of disease. Counseling  Your health care provider may ask you  questions about your:  Alcohol use.  Tobacco use.  Drug use.  Emotional well-being.  Home and relationship well-being.  Sexual activity.  Eating habits.  History of falls.  Memory and ability to understand (cognition).  Work and work Statistician.  Reproductive health. Screening  You may have the following tests or measurements:  Height, weight, and BMI.  Blood pressure.  Lipid and cholesterol levels. These may be checked every 5 years, or more frequently if you are over 17 years old.  Skin check.  Lung cancer screening. You may have this screening every year starting at age 65 if you have a 30-pack-year history of smoking and currently smoke or have quit within the past 15 years.  Fecal occult blood test (FOBT) of the stool. You may have this test every year starting at age 62.  Flexible sigmoidoscopy or colonoscopy. You may have a sigmoidoscopy every 5 years or a colonoscopy every 10 years starting at age 80.  Hepatitis C blood test.  Hepatitis B blood test.  Sexually transmitted disease (STD) testing.  Diabetes screening. This is done by checking your blood sugar (glucose) after you have not eaten for a while (fasting). You may have this done every 1-3 years.  Bone density scan. This is done to screen for osteoporosis. You may have this done starting at age 32.  Mammogram. This may be done every 1-2 years. Talk to your health care provider about how often you should have regular mammograms. Talk with your health care provider about your test results, treatment options, and if necessary, the need for more tests. Vaccines  Your health care provider may recommend certain vaccines, such as:  Influenza vaccine.  This is recommended every year.  Tetanus, diphtheria, and acellular pertussis (Tdap, Td) vaccine. You may need a Td booster every 10 years.  Zoster vaccine. You may need this after age 50.  Pneumococcal 13-valent conjugate (PCV13) vaccine. One dose is  recommended after age 79.  Pneumococcal polysaccharide (PPSV23) vaccine. One dose is recommended after age 75. Talk to your health care provider about which screenings and vaccines you need and how often you need them. This information is not intended to replace advice given to you by your health care provider. Make sure you discuss any questions you have with your health care provider. Document Released: 02/26/2015 Document Revised: 10/20/2015 Document Reviewed: 12/01/2014 Elsevier Interactive Patient Education  2017 [employer] Prevention in the Home Falls can cause injuries. They can happen to people of all ages. There are many things you can do to make your home safe and to help prevent falls. What can I do on the outside of my home?  Regularly fix the edges of walkways and driveways and fix any cracks.  Remove anything that might make you trip as you walk through a door, such as a raised step or threshold.  Trim any bushes or trees on the path to your home.  Use bright outdoor lighting.  Clear any walking paths of anything that might make someone trip, such as rocks or tools.  Regularly check to see if handrails are loose or broken. Make sure that both sides of any steps have handrails.  Any raised decks and porches should have guardrails on the edges.  Have any leaves, snow, or ice cleared regularly.  Use sand or salt on walking paths during winter.  Clean up any spills in your garage right away. This includes oil or grease spills. What can I do in the bathroom?  Use night lights.  Install grab bars by the toilet and in the tub and shower. Do not use towel bars as grab bars.  Use non-skid mats or decals in the tub or shower.  If you need to sit down in the shower, use a plastic, non-slip stool.  Keep the floor dry. Clean up any water that spills on the floor as soon as it happens.  Remove soap buildup in the tub or shower regularly.  Attach bath mats  securely with double-sided non-slip rug tape.  Do not have throw rugs and other things on the floor that can make you trip. What can I do in the bedroom?  Use night lights.  Make sure that you have a light by your bed that is easy to reach.  Do not use any sheets or blankets that are too big for your bed. They should not hang down onto the floor.  Have a firm chair that has side arms. You can use this for support while you get dressed.  Do not have throw rugs and other things on the floor that can make you trip. What can I do in the kitchen?  Clean up any spills right away.  Avoid walking on wet floors.  Keep items that you use a lot in easy-to-reach places.  If you need to reach something above you, use a strong step stool that has a grab bar.  Keep electrical cords out of the way.  Do not use floor polish or wax that makes floors slippery. If you must use wax, use non-skid floor wax.  Do not have throw rugs and other things on the floor that can make  you trip. What can I do with my stairs?  Do not leave any items on the stairs.  Make sure that there are handrails on both sides of the stairs and use them. Fix handrails that are broken or loose. Make sure that handrails are as long as the stairways.  Check any carpeting to make sure that it is firmly attached to the stairs. Fix any carpet that is loose or worn.  Avoid having throw rugs at the top or bottom of the stairs. If you do have throw rugs, attach them to the floor with carpet tape.  Make sure that you have a light switch at the top of the stairs and the bottom of the stairs. If you do not have them, ask someone to add them for you. What else can I do to help prevent falls?  Wear shoes that:  Do not have high heels.  Have rubber bottoms.  Are comfortable and fit you well.  Are closed at the toe. Do not wear sandals.  If you use a stepladder:  Make sure that it is fully opened. Do not climb a closed  stepladder.  Make sure that both sides of the stepladder are locked into place.  Ask someone to hold it for you, if possible.  Clearly mark and make sure that you can see:  Any grab bars or handrails.  First and last steps.  Where the edge of each step is.  Use tools that help you move around (mobility aids) if they are needed. These include:  Canes.  Walkers.  Scooters.  Crutches.  Turn on the lights when you go into a dark area. Replace any light bulbs as soon as they burn out.  Set up your furniture so you have a clear path. Avoid moving your furniture around.  If any of your floors are uneven, fix them.  If there are any pets around you, be aware of where they are.  Review your medicines with your doctor. Some medicines can make you feel dizzy. This can increase your chance of falling. Ask your doctor what other things that you can do to help prevent falls. This information is not intended to replace advice given to you by your health care provider. Make sure you discuss any questions you have with your health care provider. Document Released: 11/26/2008 Document Revised: 07/08/2015 Document Reviewed: 03/06/2014 Elsevier Interactive Patient Education  2017 [employer].

## 2019-11-13 NOTE — Telephone Encounter (Signed)
Ms. shalece, staffa are scheduled for a virtual visit with your provider today.    Just as we do with appointments in the office, we must obtain your consent to participate.  Your consent will be active for this visit and any virtual visit you may have with one of our providers in the next 365 days.    If you have a MyChart account, I can also send a copy of this consent to you electronically.  All virtual visits are billed to your insurance company just like a traditional visit in the office.  As this is a virtual visit, video technology does not allow for your provider to perform a traditional examination.  This may limit your provider's ability to fully assess your condition.  If your provider identifies any concerns that need to be evaluated in person or the need to arrange testing such as labs, EKG, etc, we will make arrangements to do so.    Although advances in technology are sophisticated, we cannot ensure that it will always work on either your end or our end.  If the connection with a video visit is poor, we may have to switch to a telephone visit.  With either a video or telephone visit, we are not always able to ensure that we have a secure connection.   I need to obtain your verbal consent now.   Are you willing to proceed with your visit today?   VICTORIYA POL has provided verbal consent on 11/13/2019 for a virtual visit (video or telephone).   Carroll Kinds, CMA 11/13/2019  2:29 PM

## 2019-11-14 ENCOUNTER — Other Ambulatory Visit: Payer: Self-pay | Admitting: Internal Medicine

## 2019-11-14 ENCOUNTER — Encounter: Payer: Medicare Other | Admitting: Family

## 2019-11-17 ENCOUNTER — Ambulatory Visit (INDEPENDENT_AMBULATORY_CARE_PROVIDER_SITE_OTHER): Payer: Medicare Other | Admitting: Orthopedic Surgery

## 2019-11-17 ENCOUNTER — Encounter: Payer: Self-pay | Admitting: Orthopedic Surgery

## 2019-11-17 VITALS — Ht 63.0 in | Wt 153.0 lb

## 2019-11-17 DIAGNOSIS — M5440 Lumbago with sciatica, unspecified side: Secondary | ICD-10-CM

## 2019-11-17 NOTE — Progress Notes (Signed)
Office Visit Note   Patient: Ariana [name]           Date of Birth: 1929-08-27           MRN: [id number] Visit Date: 11/17/2019              Requested by: Gayland Curry, DO [place],  [place] [id number] PCP: Gayland Curry, DO  Chief Complaint  Patient presents with  . Lower Back - Follow-up  . Left Shoulder - Follow-up      HPI: This is a pleasant 86 year old woman who follows up for her low back pain and left shoulder.  She did have a injection into the shoulder last visit.  She says it helps minimally.  Lower back pain is the bigger issue now.  She is scheduled for an MRI of her back with follow-up with Dr. Ernestina Patches.  She is also scheduled for PT but they have been unsuccessful with arranging this through her daughter Assessment & Plan: Visit Diagnoses: No diagnosis found.  Plan: She will follow up as needed for the shoulder with [place] continue physical therapy she should obtain her MRI and follow-up with Dr. Ernestina Patches  Follow-Up Instructions: No follow-ups on file.   Ortho Exam  Patient is alert, oriented, no adenopathy, well-dressed, normal affect, normal respiratory effort. Focused examination still some pain with resisted abduction internal rotation behind the back back symptoms have remained stable with dorsiflexion plantarflexion she has buttock pain that radiates down the left lower extremity no muscle atrophy is noted  Imaging: No results found. No images are attached to the encounter.  Labs: Lab Results  Component Value Date   HGBA1C 6.0 (H) 03/07/2018   HGBA1C 6.4 (H) 09/04/2017   HGBA1C 9.1 (H) 03/22/2017   ESRSEDRATE 5 08/23/2012   ESRSEDRATE 16 07/18/2011   ESRSEDRATE 11 07/08/2010   REPTSTATUS 05/23/2019 FINAL 05/20/2019   CULT >=100,000 COLONIES/mL ESCHERICHIA COLI (A) 05/20/2019   LABORGA ESCHERICHIA COLI (A) 05/20/2019     Lab Results  Component Value Date   ALBUMIN 4.1 05/20/2019   ALBUMIN 4.5 03/04/2019   ALBUMIN 4.4 12/04/2018     Lab Results  Component Value Date   MG 2.0 04/24/2017   MG 2.0 04/23/2017   MG 2.0 06/29/2015   Lab Results  Component Value Date   VD25OH 52 05/16/2012    No results found for: PREALBUMIN CBC EXTENDED Latest Ref Rng & Units 05/20/2019 03/04/2019 12/13/2018  WBC 4.0 - 10.5 K/uL 6.1 7.0 8.0  RBC 3.87 - 5.11 MIL/uL 4.23 4.39 4.25  HGB 12.0 - 15.0 g/dL 13.2 13.7 13.4  HCT 36 - 46 % 40.9 42.2 40.9  PLT 150 - 400 K/uL 199 207 211  NEUTROABS 1.7 - 7.7 K/uL 3.3 4.0 5.4  LYMPHSABS 0.7 - 4.0 K/uL 1.8 2.2 1.6     Body mass index is 27.1 kg/m.  Orders:  No orders of the defined types were placed in this encounter.  No orders of the defined types were placed in this encounter.    Procedures: No procedures performed  Clinical Data: No additional findings.  ROS:  All other systems negative, except as noted in the HPI. Review of Systems  Objective: Vital Signs: Ht 5\' 3"  (1.6 m)   Wt 153 lb (69.4 kg)   BMI 27.10 kg/m   Specialty Comments:  No specialty comments available.  PMFS History: Patient Active Problem List   Diagnosis Date Noted  . Diabetic neuropathy ([place]) 01/28/2019  .  Diabetes mellitus due to underlying condition, uncontrolled ([place]) 09/04/2017  . History of rectal bleeding 09/04/2017  . A-fib ([place]) 04/23/2017  . Diabetes mellitus type 2 in nonobese ([place]) 04/23/2017  . Sjogren's disease ([place]) 04/03/2016  . Primary osteoarthritis of both hands 04/03/2016  . High risk medication use 04/03/2016  . Senile dementia, with behavioral disturbance ([place]) 08/16/2015  . Swelling 08/16/2015  . Diastolic dysfunction 83/66/2947  . Hypertonicity, bladder 07/08/2015  . Arterial hypotension   . Bradycardia 06/29/2015  . Chronic lower back pain 06/29/2015  . PAF (paroxysmal atrial fibrillation) ([place]) 04/18/2015  . Dehydration 04/18/2015  . Dementia without behavioral disturbance ([place]) 04/18/2015  . TIA (transient ischemic attack) 12/17/2014  . Chest pain 12/16/2014   . Acute encephalopathy 12/04/2014  . Fall   . AKI (acute kidney injury) ([place]) 12/03/2014  . History of cerebrovascular disease 09/24/2014  . Falls frequently 07/28/2014  . Infarction of parietal lobe ([place])   . Mild dementia ([place])   . CVA (cerebral infarction) 07/26/2014  . Atrial fibrillation with RVR ([place]) 07/03/2014  . Constipation 04/15/2014  . Mixed stress and urge urinary incontinence 11/03/2013  . Therapeutic opioid induced constipation 11/03/2013  . Hemorrhoid 11/03/2013  . Low back pain associated with a spinal disorder other than radiculopathy or spinal stenosis 11/03/2013  . Protein-calorie malnutrition, severe ([place]) 07/22/2013  . UTI (lower urinary tract infection) 07/20/2013  . Prolonged QT interval 07/20/2013  . Osteopenia 07/17/2013  . Palpitations 06/30/2013  . Hereditary and idiopathic peripheral neuropathy 05/22/2013  . Abnormality of gait 12/05/2012  . Headache(784.0) 09/05/2012  . Multinodular thyroid 01/15/2012  . Neck pain 01/15/2012  . Hypercholesterolemia 07/08/2010  . Tear film insufficiency 07/06/2009  . [place] SYNDROME 07/06/2009  . Urge urinary incontinence 01/06/2009  . COLONIC POLYPS, ADENOMATOUS, HX OF 04/15/2008  . MITRAL VALVE PROLAPSE 11/05/2007  . ANXIETY DEPRESSION 07/02/2007  . Mononeuritis 07/02/2007  . HYPERTENSION, BENIGN 07/02/2007  . GERD 07/02/2007  . Irritable bowel syndrome 07/02/2007  . Fibromyalgia 07/02/2007   Past Medical History:  Diagnosis Date  . Abnormality of gait   . Adenomatous polyp of colon 2002   48mm  . Allergic rhinitis   . Anxiety   . Anxiety and depression   . Chronic back pain   . Dementia without behavioral disturbance ([place])   . Depression   . Diabetes ([place])   . Diverticulosis of colon   . Dry eye syndrome   . Dysphagia   . Dysthymic disorder   . Fall   . Fibromyalgia   . GERD (gastroesophageal reflux disease)   . H/O hiatal hernia   . History of adverse drug reaction   . History of  cerebrovascular disease 09/24/2014  . History of recurrent UTIs   . Hypertension, benign   . Irritable bowel syndrome   . Low back pain syndrome   . Memory loss   . Mitral valve prolapse   . Paroxysmal A-fib ([place])   . Peripheral neuropathy    "both feet and legs"  . Physical deconditioning   . Sjogren's syndrome ([place])   . Therapeutic opioid-induced constipation ([facility])   . Thyroid nodule   . Urinary incontinence     Family History  Problem Relation Age of Onset  . Heart disease Father        heart attack  . Pneumonia Father   . Heart attack Mother   . Hypertension Mother   . Colon cancer Sister   . Kidney disease Daughter   . Asthma Daughter   .  Arthritis Daughter 66       osteo,  . Heart disease Son 74       stage 3 CHF(Diastolic /Systolic)  . Throat cancer Brother   . Hypertension Maternal Grandmother     Past Surgical History:  Procedure Laterality Date  . ABDOMINAL HYSTERECTOMY  1967  . APPENDECTOMY    . CARDIAC CATHETERIZATION  02/17/2003   normal L main, LAD free of disease, Cfx free of disease, RCA free of disease (Dr. Loni Muse. [place])  . CATARACT EXTRACTION, BILATERAL    . CHOLECYSTECTOMY  2000  . COLONOSCOPY W/ BIOPSIES     multiple  . DENTAL SURGERY     multiple tooth extractions  . ESOPHAGOGASTRODUODENOSCOPY (EGD) WITH ESOPHAGEAL DILATION N/A 08/23/2012   Procedure: ESOPHAGOGASTRODUODENOSCOPY (EGD) WITH ESOPHAGEAL DILATION;  Surgeon: Milus Banister, MD;  Location: [facility];  Service: Endoscopy;  Laterality: N/A;  . NASAL SEPTUM SURGERY  1980  . NM MYOCAR PERF WALL MOTION  2003   persantine - normal static and dynamic study w/apical thinning and presvered LV function, no ischemia  . SINUS EXPLORATION     ossifiying fibroma  . TEMPOROMANDIBULAR JOINT SURGERY  1986   Dr. Terence Lux  . TRANSTHORACIC ECHOCARDIOGRAM  2001   mild LVH, normal LV   Social History   Occupational History  . Occupation: Retired  Tobacco Use  . Smoking status: Former Research scientist (life sciences)  .  Smokeless tobacco: Never Used  . Tobacco comment: Quit at age 50   Vaping Use  . Vaping Use: Never used  Substance and Sexual Activity  . Alcohol use: No  . Drug use: Never  . Sexual activity: Never

## 2019-11-18 ENCOUNTER — Other Ambulatory Visit: Payer: Medicare Other

## 2019-11-24 ENCOUNTER — Encounter: Payer: Self-pay | Admitting: Internal Medicine

## 2019-11-24 ENCOUNTER — Ambulatory Visit (INDEPENDENT_AMBULATORY_CARE_PROVIDER_SITE_OTHER): Payer: Medicare Other | Admitting: Internal Medicine

## 2019-11-24 ENCOUNTER — Other Ambulatory Visit: Payer: Self-pay

## 2019-11-24 VITALS — BP 118/62 | HR 80 | Temp 97.7°F | Wt 152.8 lb

## 2019-11-24 DIAGNOSIS — F0150 Vascular dementia without behavioral disturbance: Secondary | ICD-10-CM

## 2019-11-24 DIAGNOSIS — Z23 Encounter for immunization: Secondary | ICD-10-CM | POA: Diagnosis not present

## 2019-11-24 DIAGNOSIS — E1140 Type 2 diabetes mellitus with diabetic neuropathy, unspecified: Secondary | ICD-10-CM

## 2019-11-24 DIAGNOSIS — E0865 Diabetes mellitus due to underlying condition with hyperglycemia: Secondary | ICD-10-CM

## 2019-11-24 DIAGNOSIS — M3501 Sicca syndrome with keratoconjunctivitis: Secondary | ICD-10-CM | POA: Diagnosis not present

## 2019-11-24 DIAGNOSIS — K582 Mixed irritable bowel syndrome: Secondary | ICD-10-CM | POA: Diagnosis not present

## 2019-11-24 DIAGNOSIS — M545 Low back pain, unspecified: Secondary | ICD-10-CM

## 2019-11-24 NOTE — Progress Notes (Signed)
Location:  [facility] clinic Provider:  Solara Goodchild L. Mariea Clonts, D.O., C.M.D.  Goals of Care:  Advanced Directives 11/24/2019  Does Patient Have a Medical Advance Directive? Yes  Type of Advance Directive [place]  Does patient want to make changes to medical advance directive? No - Guardian declined  Copy of [place] in Chart? -  Would patient like information on creating a medical advance directive? -  Pre-existing out of facility DNR order (yellow form or pink MOST form) -     Chief Complaint  Patient presents with  . Medical Management of Chronic Issues    4 month follow up   . Health Maintenance    eye exam, influenza, A1C  and mammogram     HPI: Patient is a 86 y.o. female seen today for medical management of chronic diseases.  Here with a new caregiver again, [name].    Sugars doing well each morning.  [name] adjusted her diet.  130s CBGs.  143 after rice in the evenings.  She is eating what she's fixed.  It's challenging with her dentition.  Need to get hba1c but phlebotomist left early today.  She's getting water three meals per day.  No recent falls. She brought her cane today.  She saw Dr. Jess Barters PA who gave her an injection in her left shoulder last month which hurt after she'd fallen trying to visit her neighbor who had borrowed her plant hanger.    She is to be continuing with PT and to get an MRI 10/15 of her back and f/u with Dr. Ernestina Patches.    Got her flu shot.  Past Medical History:  Diagnosis Date  . Abnormality of gait   . Adenomatous polyp of colon 2002   19mm  . Allergic rhinitis   . Anxiety   . Anxiety and depression   . Chronic back pain   . Dementia without behavioral disturbance ([place])   . Depression   . Diabetes ([place])   . Diverticulosis of colon   . Dry eye syndrome   . Dysphagia   . Dysthymic disorder   . Fall   . Fibromyalgia   . GERD (gastroesophageal reflux disease)   . H/O hiatal hernia   . History of  adverse drug reaction   . History of cerebrovascular disease 09/24/2014  . History of recurrent UTIs   . Hypertension, benign   . Irritable bowel syndrome   . Low back pain syndrome   . Memory loss   . Mitral valve prolapse   . Paroxysmal A-fib ([place])   . Peripheral neuropathy    "both feet and legs"  . Physical deconditioning   . Sjogren's syndrome ([place])   . Therapeutic opioid-induced constipation (OIC)   . Thyroid nodule   . Urinary incontinence     Past Surgical History:  Procedure Laterality Date  . ABDOMINAL HYSTERECTOMY  1967  . APPENDECTOMY    . CARDIAC CATHETERIZATION  02/17/2003   normal L main, LAD free of disease, Cfx free of disease, RCA free of disease (Dr. Loni Muse. [place])  . CATARACT EXTRACTION, BILATERAL    . CHOLECYSTECTOMY  2000  . COLONOSCOPY W/ BIOPSIES     multiple  . DENTAL SURGERY     multiple tooth extractions  . ESOPHAGOGASTRODUODENOSCOPY (EGD) WITH ESOPHAGEAL DILATION N/A 08/23/2012   Procedure: ESOPHAGOGASTRODUODENOSCOPY (EGD) WITH ESOPHAGEAL DILATION;  Surgeon: Milus Banister, MD;  Location: [facility] ENDOSCOPY;  Service: Endoscopy;  Laterality: N/A;  . NASAL SEPTUM SURGERY  [place] WALL MOTION  2003   persantine - normal static and dynamic study w/apical thinning and presvered LV function, no ischemia  . SINUS EXPLORATION     ossifiying fibroma  . TEMPOROMANDIBULAR JOINT SURGERY  1986   Dr. Terence Lux  . TRANSTHORACIC ECHOCARDIOGRAM  2001   mild LVH, normal LV    Allergies  Allergen Reactions  . Banana Nausea And Vomiting  . Codeine Nausea Only    unless given with Phenergan  . Klonopin [Clonazepam] Other (See Comments)    Causes hallucination   . Meperidine Hcl Nausea Only    unless given with Phenergan  . Norflex [Orphenadrine Citrate] Nausea Only    Unless given with Phenergan  . Oxycodone-Acetaminophen Nausea Only    unless given with phenergan  . Propoxyphene Hcl Nausea Only    unless given with phenergan  . Zoloft [Sertraline  Hcl] Other (See Comments)    Caused lethargy  . Doxycycline Other (See Comments)    Unknown reaction  . Naproxen Other (See Comments)    Unknown reaction  . Penicillins Other (See Comments)    Unknown reaction Has patient had a PCN reaction causing immediate rash, facial/tongue/throat swelling, SOB or lightheadedness with hypotension: Unknown Has patient had a PCN reaction causing severe rash involving mucus membranes or skin necrosis: Unknown Has patient had a PCN reaction that required hospitalization: pt was in the hospital at time of reaction Has patient had a PCN reaction occurring within the last 10 years: Unknown If all of the above answers are "NO", then may proceed with Cephalos  . Phenothiazines Other (See Comments)    Unknown reaction  . Stelazine Other (See Comments)    Unknown reaction  . Sulfamethoxazole-Trimethoprim Other (See Comments)    Unknown reaction  . Tolectin [Tolmetin Sodium] Other (See Comments)    Unknown reaction  . Tramadol Other (See Comments)    Unknown reaction    Outpatient Encounter Medications as of 11/24/2019  Medication Sig  . acetaminophen (TYLENOL) 500 MG tablet Take 1,000 mg by mouth 2 (two) times daily.  Marland Kitchen ALPRAZolam (XANAX) 0.25 MG tablet Take 1 tablet by mouth twice daily as needed  . antiseptic oral rinse (BIOTENE) LIQD 15 mLs by Mouth Rinse route 5 (five) times daily as needed for dry mouth.  Marland Kitchen aspirin 325 MG tablet Take 1 tablet (325 mg total) by mouth daily.  . Capsaicin-Menthol-Methyl Sal (CAPSAICIN-METHYL SAL-MENTHOL) 0.025-1-12 % CREA Apply 1 application topically 2 (two) times daily.  . carboxymethylcellulose (REFRESH TEARS) 0.5 % SOLN Place 1 drop into both eyes 5 (five) times daily as needed (dry eyes).   . cefaclor (CECLOR) 500 MG capsule Take 1 capsule (500 mg total) by mouth 3 (three) times daily.  Marland Kitchen CRANBERRY PO Take 2 tablets by mouth daily.  Marland Kitchen diltiazem (CARDIZEM CD) 180 MG 24 hr capsule TAKE 1 CAPSULE BY MOUTH ONCE DAILY    . docusate sodium (COLACE) 100 MG capsule Take 2 capsules (200 mg total) by mouth daily.  . DULoxetine (CYMBALTA) 30 MG capsule TAKE 1 CAPSULE BY MOUTH ONCE DAILY FOR BACK PAIN AND FOR DEPRESSION  . DULoxetine (CYMBALTA) 60 MG capsule Take 1 capsule by mouth once daily  . furosemide (LASIX) 40 MG tablet Take 1 tablet by mouth once daily  . glucose blood (ONETOUCH VERIO) test strip Use to test blood sugar daily. Dx:E08.65  . hydrocortisone (ANUSOL-HC) 2.5 % rectal cream Place 1 application rectally daily. Use for 10 days  . JANUVIA  100 MG tablet Take 1 tablet by mouth once daily  . Lancets Micro Thin 33G MISC Use to test blood sugar daily. Dx: E08.65  . lansoprazole (PREVACID) 30 MG capsule Take one capsule by mouth once daily at noon  . linaclotide (LINZESS) 290 MCG CAPS capsule TAKE 1 CAPSULE BY MOUTH EVERY DAY AT BEDTIME  . memantine (NAMENDA) 10 MG tablet Take 1 tablet by mouth twice daily  . MYRBETRIQ 50 MG TB24 tablet Take 1 tablet by mouth once daily  . NIACIN PO Take by mouth daily.  . pilocarpine (SALAGEN) 5 MG tablet Take 1 tablet by mouth twice daily  . polyethylene glycol (MIRALAX / GLYCOLAX) packet Take by mouth daily as needed (constipation). Mix 1/2 capful of miralax in 8 oz liquid and drink  . potassium chloride SA (KLOR-CON) 20 MEQ tablet Take 1 tablet by mouth once daily  . pregabalin (LYRICA) 100 MG capsule Take 1 capsule (100 mg total) by mouth 2 (two) times daily.  Marland Kitchen saccharomyces boulardii (FLORASTOR) 250 MG capsule Take 1 capsule (250 mg total) by mouth 2 (two) times daily.   No facility-administered encounter medications on file as of 11/24/2019.    Review of Systems:  Review of Systems  Constitutional: Positive for malaise/fatigue. Negative for chills and fever.  HENT: Positive for hearing loss. Negative for congestion and sore throat.   Eyes: Negative for blurred vision.  Respiratory: Negative for cough and shortness of breath.   Cardiovascular: Negative for  chest pain, palpitations and leg swelling.  Gastrointestinal: Positive for constipation. Negative for abdominal pain, blood in stool, diarrhea and melena.  Genitourinary: Negative for dysuria.       Urge and functional incontinence  Musculoskeletal: Positive for back pain and joint pain. Negative for falls.  Skin: Negative for itching and rash.  Neurological: Positive for weakness. Negative for dizziness and loss of consciousness.  Psychiatric/Behavioral: Positive for depression and memory loss. Negative for suicidal ideas. The patient is nervous/anxious. The patient does not have insomnia.     Health Maintenance  Topic Date Due  . OPHTHALMOLOGY EXAM  Never done  . HEMOGLOBIN A1C  09/05/2018  . MAMMOGRAM  09/12/2019  . FOOT EXAM  01/28/2020  . TETANUS/TDAP  12/09/2023  . INFLUENZA VACCINE  Completed  . DEXA SCAN  Completed  . COVID-19 Vaccine  Completed  . PNA vac Low Risk Adult  Completed  . URINE MICROALBUMIN  Discontinued    Physical Exam: Vitals:   11/24/19 1422  BP: 118/62  Pulse: 80  Temp: 97.7 F (36.5 C)  TempSrc: Temporal  SpO2: 97%  Weight: 152 lb 12.8 oz (69.3 kg)   Body mass index is 27.07 kg/m. Physical Exam Vitals reviewed.  Constitutional:      Appearance: Normal appearance.  HENT:     Head: Normocephalic and atraumatic.  Cardiovascular:     Rate and Rhythm: Rhythm irregular.     Heart sounds: No murmur heard.   Pulmonary:     Effort: Pulmonary effort is normal.     Breath sounds: Normal breath sounds. No wheezing, rhonchi or rales.  Abdominal:     General: Bowel sounds are normal. There is distension.     Palpations: Abdomen is soft. There is no mass.     Tenderness: There is abdominal tenderness. There is no guarding or rebound.     Comments: No palpable hepatomegaly or mass in RUQ  Musculoskeletal:        General: Normal range of motion.  Right lower leg: No edema.     Left lower leg: No edema.  Skin:    General: Skin is warm and dry.       Coloration: Skin is pale.  Neurological:     General: No focal deficit present.     Mental Status: She is alert. Mental status is at baseline.     Motor: Weakness present.     Gait: Gait abnormal.     Comments: Using cane today  Psychiatric:        Mood and Affect: Mood normal.     Comments: Was talkative, in fairly good spirits, seems to like Cynthia at this point     Labs reviewed: Basic Metabolic Panel: Recent Labs    12/13/18 1048 03/04/19 1505 05/20/19 1646  NA 140 138 139  K 3.8 4.1 4.3  CL 105 102 103  CO2 27 26 26   GLUCOSE 120* 118* 110*  BUN 27* 25* 17  CREATININE 0.72 0.78 0.55  CALCIUM 9.6 9.9 9.5   Liver Function Tests: Recent Labs    12/04/18 1245 03/04/19 1505 05/20/19 1646  AST 17 24 21   ALT 16 22 26   ALKPHOS 77 89 90  BILITOT 0.4 0.7 0.4  PROT 7.6 8.1 7.4  ALBUMIN 4.4 4.5 4.1   Recent Labs    03/04/19 1505 05/20/19 1646  LIPASE 32 40   No results for input(s): AMMONIA in the last 8760 hours. CBC: Recent Labs    12/13/18 1048 03/04/19 1505 05/20/19 1646  WBC 8.0 7.0 6.1  NEUTROABS 5.4 4.0 3.3  HGB 13.4 13.7 13.2  HCT 40.9 42.2 40.9  MCV 96.2 96.1 96.7  PLT 211 207 199   Lipid Panel: No results for input(s): CHOL, HDL, LDLCALC, TRIG, CHOLHDL, LDLDIRECT in the last 8760 hours. Lab Results  Component Value Date   HGBA1C 6.0 (H) 03/07/2018     Assessment/Plan 1. Need for influenza vaccination - Flu Vaccine QUAD High Dose(Fluad)  2. Vascular dementia without behavioral disturbance ([place]) -remains her biggest issue along with bipolar disorder -she ideally should have 24 hr care and this has been recommended on numerous occasions to her daughter but it has never come to fruition -she has caregiver assistance during the week during the day only  3. Irritable bowel syndrome with both constipation and diarrhea -discussed managing the constipation part which seems to be a bigger issue for her the past few years -fiber,  hydration, mobility and regular bowel regimen as ordered  4. Sjogren's syndrome with keratoconjunctivitis sicca ([place]) -reminded about biotene and lozenges  5. Diabetes mellitus type 2, controlled, with neuropathy ([place]) -no recent hba1c and phlebotomist was gone for the day so I could not do labs without sending her off site  Lab Results  Component Value Date   HGBA1C 6.0 (H) 03/07/2018    6. Low back pain associated with a spinal disorder other than radiculopathy or spinal stenosis -doing better lately, has had therapy again and back to use of cane instead of walker -cont home exercises -seems she has better caregiver support now  Labs/tests ordered:  Hba1c, bmp  Next appt:  4 mos med mgt with hba1c, bmp same day   Taniela Feltus L. Tila Millirons, D.O. [facility] [address], [place] [address] Cell Phone (Mon-Fri 8am-5pm):  [phone] On Call:  [phone] & follow prompts after 5pm & weekends Office Phone:  [phone] Office Fax:  [phone]

## 2019-11-28 ENCOUNTER — Ambulatory Visit
Admission: RE | Admit: 2019-11-28 | Discharge: 2019-11-28 | Disposition: A | Discharge status: Home / Self Care | Payer: Medicare Other | Source: Ambulatory Visit | Attending: Physician Assistant | Admitting: Physician Assistant

## 2019-11-28 ENCOUNTER — Other Ambulatory Visit: Payer: Self-pay

## 2019-11-28 DIAGNOSIS — M5440 Lumbago with sciatica, unspecified side: Secondary | ICD-10-CM

## 2019-11-28 DIAGNOSIS — M545 Low back pain, unspecified: Secondary | ICD-10-CM | POA: Diagnosis not present

## 2019-12-02 ENCOUNTER — Other Ambulatory Visit: Payer: Self-pay | Admitting: Internal Medicine

## 2019-12-03 ENCOUNTER — Ambulatory Visit: Payer: Medicare Other | Admitting: Nutrition

## 2019-12-09 ENCOUNTER — Encounter: Payer: Medicare Other | Attending: Internal Medicine | Admitting: Dietician

## 2019-12-09 ENCOUNTER — Other Ambulatory Visit: Payer: Self-pay

## 2019-12-09 ENCOUNTER — Encounter: Payer: Self-pay | Admitting: Dietician

## 2019-12-09 DIAGNOSIS — E119 Type 2 diabetes mellitus without complications: Secondary | ICD-10-CM | POA: Diagnosis not present

## 2019-12-09 NOTE — Progress Notes (Signed)
  Medical Nutrition Therapy:  Appt start time: 3235 end time:  5732.   Assessment:  Primary concerns today:  Patient is here today with her caregiver [name] who is with her 8 hours per day 5 days per week.   [name] is a new caregiver and has been with her for the past 6-8 weeks. Per MD she needs 24 hour care.  Son-in-law brings meals on weekends. She was referred for Type 2 diabetes (2019), edentulous, IBS (mostly constipation), sojourns  syndrome with keratoconjuctivitis sicca, vascular dementia, sciatic back pain Nutrition Focussed Physical exam was WNL. 158 lbs at MD recently. Most recent A1C 6% 03/07/2018 Blood sugar is checked most days and is frequently WNL (113-144 fasting and 116-175 post meal.  Patient lives alone.  [name] does the cooking and shopping.  [name] is mindful to serve foods that are helpful for Ms. Albin's diabetes.  Patient  is retired from [employer]. She walks with a cane.  Walks around the town home when weather is nice.   Preferred Learning Style:   No preference indicated   Learning Readiness:   Ready  MEDICATIONS: see list to include Januvia, probiotic, Linzess, Niacin, occasional Miralax   DIETARY INTAKE: Caregiver cooks with no added salt and little fat. Usual eating pattern includes 3 meals and 1 snacks per day.  Occasional Glucerna when she is coming to a doctor's appointment.  24-hr recall:  B (10 AM): eggs, cinnamon chex cereal, Lactaid whole milk, hot tea  Snk ( AM):  None  L (2 PM): PB and jelly sandwich or grilled cheese sandwich on oat bread Snk ( PM): occasional Glucerna D ( PM): meat (beef, chicken, or pork), 2 canned and rinsed vegetables, occasional brown rice Snk ( PM): sugar free jello Beverages: water, plain hot tea, Glucerna, Lactaid whole milk  Usual physical activity: small amount walking with a cane daily   Estimated energy needs: 1600-1800 calories 180-200 g carbohydrates 60-70 g protein 50 g fat  Progress  Towards Goal(s):  In progress.   Nutritional Diagnosis:  NB-1.1 Food and nutrition-related knowledge deficit As related to balance of carbohydrate, protein, and fat.  As evidenced by diet hx and caregiver report.    Intervention:  Nutrition education related to diabetes provided to patient and caregiver.  Discussed goal blood sugar readings fasting and post meal.  Discussed a balanced plate with adequate protein and carbohydrates as well as less processed choices.  Basic meal planning discussed.  Plan: Consider having the hot meal mid day rather than in the evening. Aim for 3 Carb Choices per meal (45 grams) +/- 1 either way  Aim for 0-1 Carbs per snack if hungry  Include protein in moderation with your meals and snacks Consider reading food labels for Total Carbohydrate of foods Consider  increasing your activity level by walking for 15 minutes daily as tolerated Continue checking BG at alternate times per day  Continue taking medication as directed by MD  Teaching Method Utilized:  Visual Auditory  Handouts given during visit include:  Label reading  A1C chart  My plate  Meal plan card  Barriers to learning/adherence to lifestyle change: dementia, pain  Demonstrated degree of understanding via:  Teach Back   Monitoring/Evaluation:  Dietary intake, exercise, label reading, and body weight prn.

## 2019-12-09 NOTE — Patient Instructions (Signed)
Consider having the hot meal mid day rather than in the evening.  Aim for 3 Carb Choices per meal (45 grams) +/- 1 either way  Aim for 0-1 Carbs per snack if hungry  Include protein in moderation with your meals and snacks Consider reading food labels for Total Carbohydrate of foods Consider  increasing your activity level by walking for 15 minutes daily as tolerated Continue checking BG at alternate times per day  Continue taking medication as directed by MD

## 2019-12-16 ENCOUNTER — Ambulatory Visit (INDEPENDENT_AMBULATORY_CARE_PROVIDER_SITE_OTHER): Payer: Medicare Other

## 2019-12-16 ENCOUNTER — Encounter (HOSPITAL_COMMUNITY): Payer: Self-pay

## 2019-12-16 ENCOUNTER — Ambulatory Visit (HOSPITAL_COMMUNITY)
Admission: EM | Admit: 2019-12-16 | Discharge: 2019-12-16 | Disposition: A | Discharge status: Home / Self Care | Payer: Medicare Other | Attending: Family Medicine | Admitting: Family Medicine

## 2019-12-16 ENCOUNTER — Other Ambulatory Visit: Payer: Self-pay

## 2019-12-16 ENCOUNTER — Other Ambulatory Visit: Payer: Self-pay | Admitting: Internal Medicine

## 2019-12-16 DIAGNOSIS — S2231XA Fracture of one rib, right side, initial encounter for closed fracture: Secondary | ICD-10-CM | POA: Diagnosis not present

## 2019-12-16 DIAGNOSIS — R0781 Pleurodynia: Secondary | ICD-10-CM | POA: Diagnosis not present

## 2019-12-16 DIAGNOSIS — J9811 Atelectasis: Secondary | ICD-10-CM | POA: Diagnosis not present

## 2019-12-16 DIAGNOSIS — M549 Dorsalgia, unspecified: Secondary | ICD-10-CM

## 2019-12-16 DIAGNOSIS — W19XXXA Unspecified fall, initial encounter: Secondary | ICD-10-CM | POA: Diagnosis not present

## 2019-12-16 DIAGNOSIS — M545 Low back pain, unspecified: Secondary | ICD-10-CM | POA: Diagnosis not present

## 2019-12-16 DIAGNOSIS — K2100 Gastro-esophageal reflux disease with esophagitis, without bleeding: Secondary | ICD-10-CM

## 2019-12-16 DIAGNOSIS — I700 Atherosclerosis of aorta: Secondary | ICD-10-CM | POA: Diagnosis not present

## 2019-12-16 NOTE — Discharge Instructions (Signed)
Continue taking Tylenol as needed for pain.

## 2019-12-16 NOTE — ED Triage Notes (Signed)
Pt states she had her eyebrows colored/dyed on Friday and area became itchy. Sunday itching worsened and had swelling and rash Sunday morning to eyebrow/eye area.   Denies n/v, abdominal pain, SOB, difficulty swallowing, or other complaint.  Mild edema noted to bilateral eyes.  Airway patent, able to speak in full sentences w/o SOB.

## 2019-12-16 NOTE — ED Triage Notes (Signed)
Pt c/o fall while walking with quad cane over the weekend. Caregiver reports fall occurred over the weekend. Pt has h/o dementia and can recall events of fall, but difficulty remembering day. Pt states that her cane got caught on flooring and she fell against the doorjamb and struck her right side. C/o pain to right side of ribcage, and under right breast that increases with inspiration and movement. C/o pain to right thoracic back area, but has h/o back pain as well. C/o SOB while in [facility], caregiver states pt denied SOB PTA.  Mildly coarse sounds to bilateral lungs, air exchange auscultated through all fields with slightly diminished sounds right side.  Denies LOC or head trauma during fall, or pain to other areas.

## 2019-12-16 NOTE — ED Provider Notes (Signed)
[place]   [id number] 12/16/19 Arrival Time: 1237  ASSESSMENT & PLAN:  1. Closed fracture of one rib of right side, initial encounter     I have personally viewed the imaging studies ordered this visit. Apparent 8th rib fracture; subtle. No pneumothorax.  Incentive spirometer provided by RN.   Discharge Instructions     Continue taking Tylenol as needed for pain.    See AVS for d/c information.  Reviewed expectations re: course of current medical issues. Questions answered. Outlined signs and symptoms indicating need for more acute intervention. Patient verbalized understanding. After Visit Summary given.   SUBJECTIVE:  History from: patient and nurse caregiver. Ariana [name] is a 86 y.o. female who reports a fall against a door a few days ago; with persistent R-sided rib pain. No SOB. Tylenol with some help. No head injury. Baseline mental status per caregiver. No back or abdominal pain reported.    Social History   Tobacco Use  Smoking Status Former Smoker  Smokeless Tobacco Never Used  Tobacco Comment   Quit at age 55    Social History   Substance and Sexual Activity  Alcohol Use No     OBJECTIVE:  Vitals:   12/16/19 1243 12/16/19 1245 12/16/19 1308 12/16/19 1312  BP:  (!) 142/94  125/72  Pulse:  70  77  Resp:  18  (!) 24  Temp: 97.9 F (36.6 C)   98.1 F (36.7 C)  TempSrc: Oral   Oral  SpO2:  100% 95% 95%    General appearance: alert, oriented, no acute distress ; in wheelchair Eyes: PERRLA; EOMI; conjunctivae normal HENT: normocephalic; atraumatic Neck: supple with FROM Lungs: without labored respirations; speaks full sentences without difficulty; CTAB Chest Wall: with tenderness to palpation R lower ribs; no bruising Abdomen: soft, non-tender Extremities: without edema Skin: warm and dry Neuro: normal gait Psychological: alert and cooperative; normal mood and affect     Imaging: DG Ribs Unilateral W/Chest  Right  Result Date: 12/16/2019 CLINICAL DATA:  Lower anterior rib and back pain since falling 4 days ago. EXAM: RIGHT RIBS AND CHEST - 3+ VIEW COMPARISON:  Chest radiographs 12/13/2018. FINDINGS: Mild patient rotation to the left on the frontal chest radiograph. Allowing for this, the heart size and mediastinal contours are stable. There is aortic atherosclerosis. Mild atelectasis is present at both lung bases. There is no pneumothorax or significant pleural effusion. There is a nondisplaced acute fracture of the right 8th rib anteriorly, best seen on the oblique view. No other definite acute osseous findings. Mild degenerative changes are present within the spine. IMPRESSION: 1. Nondisplaced acute fracture of the right 8th rib anteriorly. 2. Mild bibasilar atelectasis. No pneumothorax or significant pleural effusion. Electronically Signed   By: Richardean Sale M.D.   On: 12/16/2019 14:45     Allergies  Allergen Reactions  . Banana Nausea And Vomiting  . Codeine Nausea Only    unless given with Phenergan  . Klonopin [Clonazepam] Other (See Comments)    Causes hallucination   . Meperidine Hcl Nausea Only    unless given with Phenergan  . Norflex [Orphenadrine Citrate] Nausea Only    Unless given with Phenergan  . Oxycodone-Acetaminophen Nausea Only    unless given with phenergan  . Propoxyphene Hcl Nausea Only    unless given with phenergan  . Zoloft [Sertraline Hcl] Other (See Comments)    Caused lethargy  . Doxycycline Other (See Comments)    Unknown reaction  . Naproxen Other (  See Comments)    Unknown reaction  . Penicillins Other (See Comments)    Unknown reaction Has patient had a PCN reaction causing immediate rash, facial/tongue/throat swelling, SOB or lightheadedness with hypotension: Unknown Has patient had a PCN reaction causing severe rash involving mucus membranes or skin necrosis: Unknown Has patient had a PCN reaction that required hospitalization: pt was in the hospital  at time of reaction Has patient had a PCN reaction occurring within the last 10 years: Unknown If all of the above answers are "NO", then may proceed with Cephalos  . Phenothiazines Other (See Comments)    Unknown reaction  . Stelazine Other (See Comments)    Unknown reaction  . Sulfamethoxazole-Trimethoprim Other (See Comments)    Unknown reaction  . Tolectin [Tolmetin Sodium] Other (See Comments)    Unknown reaction  . Tramadol Other (See Comments)    Unknown reaction    Past Medical History:  Diagnosis Date  . Abnormality of gait   . Adenomatous polyp of colon 2002   74mm  . Allergic rhinitis   . Anxiety   . Anxiety and depression   . Chronic back pain   . Dementia without behavioral disturbance ([place])   . Depression   . Diabetes ([place])   . Diverticulosis of colon   . Dry eye syndrome   . Dysphagia   . Dysthymic disorder   . Fall   . Fibromyalgia   . GERD (gastroesophageal reflux disease)   . H/O hiatal hernia   . History of adverse drug reaction   . History of cerebrovascular disease 09/24/2014  . History of recurrent UTIs   . Hypertension, benign   . Irritable bowel syndrome   . Low back pain syndrome   . Memory loss   . Mitral valve prolapse   . Paroxysmal A-fib ([place])   . Peripheral neuropathy    "both feet and legs"  . Physical deconditioning   . Sjogren's syndrome ([place])   . Therapeutic opioid-induced constipation (OIC)   . Thyroid nodule   . Urinary incontinence    Social History   Socioeconomic History  . Marital status: Widowed    Spouse name: Not on file  . Number of children: 2  . Years of education: 55  . Highest education level: Not on file  Occupational History  . Occupation: Retired  Tobacco Use  . Smoking status: Former Research scientist (life sciences)  . Smokeless tobacco: Never Used  . Tobacco comment: Quit at age 42   Vaping Use  . Vaping Use: Never used  Substance and Sexual Activity  . Alcohol use: No  . Drug use: Never  . Sexual activity: Never  Other  Topics Concern  . 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    Patient is Widowed.    Patient has 2 children.    Patient is retired.    Former smoker   Alcohol none   Exercise Walk, exercise chair 4 days a week   POA    Walks with cane      Patient drinks about 1-2 cups of hot tea daily.   Patient is right handed.               Social Determinants of Health   Financial Resource Strain:   . Difficulty of Paying Living Expenses: Not on file  Food Insecurity:   . Worried About Charity fundraiser in the Last Year: Not  on file  . Ran Out of Food in the Last Year: Not on file  Transportation Needs:   . Lack of Transportation (Medical): Not on file  . Lack of Transportation (Non-Medical): Not on file  Physical Activity:   . Days of Exercise per Week: Not on file  . Minutes of Exercise per Session: Not on file  Stress:   . Feeling of Stress : Not on file  Social Connections:   . Frequency of Communication with Friends and Family: Not on file  . Frequency of Social Gatherings with Friends and Family: Not on file  . Attends Religious Services: Not on file  . Active Member of Clubs or Organizations: Not on file  . Attends Archivist Meetings: Not on file  . Marital Status: Not on file  Intimate Partner Violence:   . Fear of Current or Ex-Partner: Not on file  . Emotionally Abused: Not on file  . Physically Abused: Not on file  . Sexually Abused: Not on file   Family History  Problem Relation Age of Onset  . Heart disease Father        heart attack  . Pneumonia Father   . Heart attack Mother   . Hypertension Mother   . Colon cancer Sister   . Kidney disease Daughter   . Asthma Daughter   . Arthritis Daughter 42       osteo,  . Heart disease Son 33       stage 3 CHF(Diastolic /Systolic)  . Throat cancer Brother   . Hypertension Maternal Grandmother    Past Surgical History:  Procedure Laterality Date  .  ABDOMINAL HYSTERECTOMY  1967  . APPENDECTOMY    . CARDIAC CATHETERIZATION  02/17/2003   normal L main, LAD free of disease, Cfx free of disease, RCA free of disease (Dr. Loni Muse. [place])  . CATARACT EXTRACTION, BILATERAL    . CHOLECYSTECTOMY  2000  . COLONOSCOPY W/ BIOPSIES     multiple  . DENTAL SURGERY     multiple tooth extractions  . ESOPHAGOGASTRODUODENOSCOPY (EGD) WITH ESOPHAGEAL DILATION N/A 08/23/2012   Procedure: ESOPHAGOGASTRODUODENOSCOPY (EGD) WITH ESOPHAGEAL DILATION;  Surgeon: Milus Banister, MD;  Location: [facility];  Service: Endoscopy;  Laterality: N/A;  . NASAL SEPTUM SURGERY  1980  . NM MYOCAR PERF WALL MOTION  2003   persantine - normal static and dynamic study w/apical thinning and presvered LV function, no ischemia  . SINUS EXPLORATION     ossifiying fibroma  . TEMPOROMANDIBULAR JOINT SURGERY  1986   Dr. Terence Lux  . TRANSTHORACIC ECHOCARDIOGRAM  2001   mild LVH, normal LV     Vanessa Kick, MD 12/16/19 1556

## 2019-12-29 ENCOUNTER — Encounter: Payer: Self-pay | Admitting: Nurse Practitioner

## 2019-12-29 ENCOUNTER — Ambulatory Visit (INDEPENDENT_AMBULATORY_CARE_PROVIDER_SITE_OTHER): Payer: Medicare Other | Admitting: Nurse Practitioner

## 2019-12-29 ENCOUNTER — Ambulatory Visit: Payer: Medicare Other | Admitting: Nurse Practitioner

## 2019-12-29 ENCOUNTER — Other Ambulatory Visit: Payer: Self-pay

## 2019-12-29 VITALS — BP 122/70 | HR 87 | Temp 96.8°F | Ht 63.0 in | Wt 155.0 lb

## 2019-12-29 DIAGNOSIS — F0150 Vascular dementia without behavioral disturbance: Secondary | ICD-10-CM

## 2019-12-29 DIAGNOSIS — S2231XD Fracture of one rib, right side, subsequent encounter for fracture with routine healing: Secondary | ICD-10-CM

## 2019-12-29 DIAGNOSIS — R300 Dysuria: Secondary | ICD-10-CM

## 2019-12-29 LAB — POCT URINALYSIS DIPSTICK
Bilirubin, UA: NEGATIVE
Blood, UA: NEGATIVE
Glucose, UA: NEGATIVE
Ketones, UA: NEGATIVE
Nitrite, UA: NEGATIVE
Protein, UA: NEGATIVE
Spec Grav, UA: 1.015 (ref 1.010–1.025)
Urobilinogen, UA: 0.2 E.U./dL
pH, UA: 5 (ref 5.0–8.0)

## 2019-12-29 NOTE — Progress Notes (Signed)
Careteam: Patient Care Team: Gayland Curry, DO as PCP - General (Geriatric Medicine) Noralee Space, MD as Consulting Physician (Pulmonary Disease) Maisie Fus, MD as Consulting Physician (Obstetrics and Gynecology) Debara Pickett Nadean Corwin, MD as Consulting Physician (Cardiology) Monna Fam, MD as Consulting Physician (Ophthalmology) Kathrynn Ducking, MD as Consulting Physician (Neurology) Franchot Gallo, MD as Consulting Physician (Urology) Lavonna Monarch, MD as Consulting Physician (Dermatology) Gatha Mayer, MD as Consulting Physician (Gastroenterology) Standley Brooking, LCSW as Social Worker  PLACE OF SERVICE:  [place]  Advanced Directive information    Allergies  Allergen Reactions  . Banana Nausea And Vomiting  . Codeine Nausea Only    unless given with Phenergan  . Klonopin [Clonazepam] Other (See Comments)    Causes hallucination   . Meperidine Hcl Nausea Only    unless given with Phenergan  . Norflex [Orphenadrine Citrate] Nausea Only    Unless given with Phenergan  . Oxycodone-Acetaminophen Nausea Only    unless given with phenergan  . Propoxyphene Hcl Nausea Only    unless given with phenergan  . Zoloft [Sertraline Hcl] Other (See Comments)    Caused lethargy  . Doxycycline Other (See Comments)    Unknown reaction  . Naproxen Other (See Comments)    Unknown reaction  . Penicillins Other (See Comments)    Unknown reaction Has patient had a PCN reaction causing immediate rash, facial/tongue/throat swelling, SOB or lightheadedness with hypotension: Unknown Has patient had a PCN reaction causing severe rash involving mucus membranes or skin necrosis: Unknown Has patient had a PCN reaction that required hospitalization: pt was in the hospital at time of reaction Has patient had a PCN reaction occurring within the last 10 years: Unknown If all of the above answers are "NO", then may proceed with Cephalos  . Phenothiazines Other (See Comments)      Unknown reaction  . Stelazine Other (See Comments)    Unknown reaction  . Sulfamethoxazole-Trimethoprim Other (See Comments)    Unknown reaction  . Tolectin [Tolmetin Sodium] Other (See Comments)    Unknown reaction  . Tramadol Other (See Comments)    Unknown reaction    Chief Complaint  Patient presents with  . Acute Visit    Ongoing pain on the right side (rib area) due to fall. Patient was seen at urgent care on 12/16/2019. High fall risk. Patient not sure about medications and stted I may need to call daughter.      HPI: Patient is a 86 y.o. female for acute concerns. She does not wish for her caregiver to come back with her today.   Reports she has tightness and pain in her lower abdomen.  Denies constipation. Reports normal BM yesterday.  Reports some pain with urination. Reports more frequency than usual.  No fever noted.  No nausea or vomiting.  She had a fall and also reports ongoing pain around bra. Taking tylenol 1000 mg by mouth twice daily.  She is high fall risk, very unsteady.  Denies difficulty breathing or shortness of breath. Denies cough or congestion   Daughter reports she has been acting out and usually has a UTI.    Review of Systems: poor historian.  Review of Systems  Constitutional: Negative for chills, diaphoresis and fever.  Respiratory: Negative for cough and shortness of breath.   Cardiovascular: Negative for chest pain.  Musculoskeletal: Positive for falls.  Neurological: Positive for weakness.  Psychiatric/Behavioral: Positive for memory loss.    Past Medical History:  Diagnosis Date  . Abnormality of gait   . Adenomatous polyp of colon 2002   18mm  . Allergic rhinitis   . Anxiety   . Anxiety and depression   . Chronic back pain   . Dementia without behavioral disturbance ([place])   . Depression   . Diabetes ([place])   . Diverticulosis of colon   . Dry eye syndrome   . Dysphagia   . Dysthymic disorder   . Fall   . Fibromyalgia    . GERD (gastroesophageal reflux disease)   . H/O hiatal hernia   . History of adverse drug reaction   . History of cerebrovascular disease 09/24/2014  . History of recurrent UTIs   . Hypertension, benign   . Irritable bowel syndrome   . Low back pain syndrome   . Memory loss   . Mitral valve prolapse   . Paroxysmal A-fib ([place])   . Peripheral neuropathy    "both feet and legs"  . Physical deconditioning   . Sjogren's syndrome ([place])   . Therapeutic opioid-induced constipation (OIC)   . Thyroid nodule   . Urinary incontinence    Past Surgical History:  Procedure Laterality Date  . ABDOMINAL HYSTERECTOMY  1967  . APPENDECTOMY    . CARDIAC CATHETERIZATION  02/17/2003   normal L main, LAD free of disease, Cfx free of disease, RCA free of disease (Dr. Loni Muse. [place])  . CATARACT EXTRACTION, BILATERAL    . CHOLECYSTECTOMY  2000  . COLONOSCOPY W/ BIOPSIES     multiple  . DENTAL SURGERY     multiple tooth extractions  . ESOPHAGOGASTRODUODENOSCOPY (EGD) WITH ESOPHAGEAL DILATION N/A 08/23/2012   Procedure: ESOPHAGOGASTRODUODENOSCOPY (EGD) WITH ESOPHAGEAL DILATION;  Surgeon: Milus Banister, MD;  Location: [facility];  Service: Endoscopy;  Laterality: N/A;  . NASAL SEPTUM SURGERY  1980  . NM MYOCAR PERF WALL MOTION  2003   persantine - normal static and dynamic study w/apical thinning and presvered LV function, no ischemia  . SINUS EXPLORATION     ossifiying fibroma  . TEMPOROMANDIBULAR JOINT SURGERY  1986   Dr. Terence Lux  . TRANSTHORACIC ECHOCARDIOGRAM  2001   mild LVH, normal LV   Social History:   reports that she has quit smoking. She has never used smokeless tobacco. She reports that she does not drink alcohol and does not use drugs.  Family History  Problem Relation Age of Onset  . Heart disease Father        heart attack  . Pneumonia Father   . Heart attack Mother   . Hypertension Mother   . Colon cancer Sister   . Kidney disease Daughter   . Asthma Daughter   . Arthritis  Daughter 72       osteo,  . Heart disease Son 28       stage 3 CHF(Diastolic /Systolic)  . Throat cancer Brother   . Hypertension Maternal Grandmother     Medications: Patient's Medications  New Prescriptions   No medications on file  Previous Medications   ACETAMINOPHEN (TYLENOL) 500 MG TABLET    Take 1,000 mg by mouth 2 (two) times daily.   ALPRAZOLAM (XANAX) 0.25 MG TABLET    Take 1 tablet by mouth twice daily as needed   ANTISEPTIC ORAL RINSE (BIOTENE) LIQD    15 mLs by Mouth Rinse route 5 (five) times daily as needed for dry mouth.   ASPIRIN 325 MG TABLET    Take 1 tablet (325 mg total) by mouth daily.  CAPSAICIN-MENTHOL-METHYL SAL (CAPSAICIN-METHYL SAL-MENTHOL) 0.025-1-12 % CREA    Apply 1 application topically 2 (two) times daily.   CARBOXYMETHYLCELLULOSE (REFRESH TEARS) 0.5 % SOLN    Place 1 drop into both eyes 5 (five) times daily as needed (dry eyes).    CRANBERRY PO    Take 2 tablets by mouth daily.   DILTIAZEM (CARDIZEM CD) 180 MG 24 HR CAPSULE    TAKE 1 CAPSULE BY MOUTH ONCE DAILY   DULOXETINE (CYMBALTA) 30 MG CAPSULE    TAKE 1 CAPSULE BY MOUTH ONCE DAILY FOR BACK PAIN AND FOR DEPRESSION   DULOXETINE (CYMBALTA) 60 MG CAPSULE    Take 1 capsule by mouth once daily   FUROSEMIDE (LASIX) 40 MG TABLET    Take 1 tablet by mouth once daily   GLUCOSE BLOOD (ONETOUCH VERIO) TEST STRIP    Use to test blood sugar daily. Dx:E08.65   HYDROCORTISONE (ANUSOL-HC) 2.5 % RECTAL CREAM    Place 1 application rectally daily. Use for 10 days   JANUVIA 100 MG TABLET    Take 1 tablet by mouth once daily   LANCETS MICRO THIN 33G MISC    Use to test blood sugar daily. Dx: E08.65   LANSOPRAZOLE (PREVACID) 30 MG CAPSULE    TAKE 1 CAPSULE BY MOUTH ONCE DAILY AT NOON   LINACLOTIDE (LINZESS) 290 MCG CAPS CAPSULE    TAKE 1 CAPSULE BY MOUTH EVERY DAY AT BEDTIME   MEMANTINE (NAMENDA) 10 MG TABLET    Take 1 tablet by mouth twice daily   MYRBETRIQ 50 MG TB24 TABLET    Take 1 tablet by mouth once daily    NIACIN PO    Take by mouth daily.   PILOCARPINE (SALAGEN) 5 MG TABLET    Take 1 tablet by mouth twice daily   POLYETHYLENE GLYCOL (MIRALAX / GLYCOLAX) PACKET    Take by mouth daily as needed (constipation). Mix 1/2 capful of miralax in 8 oz liquid and drink   POTASSIUM CHLORIDE SA (KLOR-CON) 20 MEQ TABLET    Take 1 tablet by mouth once daily   PREGABALIN (LYRICA) 100 MG CAPSULE    Take 1 capsule (100 mg total) by mouth 2 (two) times daily.   SACCHAROMYCES BOULARDII (FLORASTOR) 250 MG CAPSULE    Take 1 capsule (250 mg total) by mouth 2 (two) times daily.  Modified Medications   No medications on file  Discontinued Medications   No medications on file    Physical Exam:  Vitals:   12/29/19 1606  BP: 122/70  Pulse: 87  Temp: (!) 96.8 F (36 C)  TempSrc: Temporal  SpO2: 96%  Weight: 155 lb (70.3 kg)  Height: 5\' 3"  (1.6 m)   Body mass index is 27.46 kg/m. Wt Readings from Last 3 Encounters:  12/29/19 155 lb (70.3 kg)  11/24/19 152 lb 12.8 oz (69.3 kg)  11/17/19 153 lb (69.4 kg)    Physical Exam Constitutional:      General: She is not in acute distress.    Appearance: She is well-developed. She is not diaphoretic.  HENT:     Head: Normocephalic and atraumatic.     Mouth/Throat:     Pharynx: No oropharyngeal exudate.  Eyes:     Conjunctiva/sclera: Conjunctivae normal.     Pupils: Pupils are equal, round, and reactive to light.  Cardiovascular:     Rate and Rhythm: Normal rate and regular rhythm.     Heart sounds: Normal heart sounds.  Pulmonary:     Effort: Pulmonary  effort is normal.     Breath sounds: Normal breath sounds.  Abdominal:     General: Bowel sounds are normal. There is no distension.     Palpations: Abdomen is soft. There is no mass.     Tenderness: There is abdominal tenderness (suprapubic tenderness).  Musculoskeletal:        General: No tenderness.     Cervical back: Normal range of motion and neck supple.       Back:     Comments: Tenderness along  bra line to right  Skin:    General: Skin is warm and dry.  Neurological:     Mental Status: She is alert. Mental status is at baseline.     Labs reviewed: Basic Metabolic Panel: Recent Labs    03/04/19 1505 05/20/19 1646  NA 138 139  K 4.1 4.3  CL 102 103  CO2 26 26  GLUCOSE 118* 110*  BUN 25* 17  CREATININE 0.78 0.55  CALCIUM 9.9 9.5   Liver Function Tests: Recent Labs    03/04/19 1505 05/20/19 1646  AST 24 21  ALT 22 26  ALKPHOS 89 90  BILITOT 0.7 0.4  PROT 8.1 7.4  ALBUMIN 4.5 4.1   Recent Labs    03/04/19 1505 05/20/19 1646  LIPASE 32 40   No results for input(s): AMMONIA in the last 8760 hours. CBC: Recent Labs    03/04/19 1505 05/20/19 1646  WBC 7.0 6.1  NEUTROABS 4.0 3.3  HGB 13.7 13.2  HCT 42.2 40.9  MCV 96.1 96.7  PLT 207 199   Lipid Panel: No results for input(s): CHOL, HDL, LDLCALC, TRIG, CHOLHDL, LDLDIRECT in the last 8760 hours. TSH: No results for input(s): TSH in the last 8760 hours. A1C: Lab Results  Component Value Date   HGBA1C 6.0 (H) 03/07/2018     Assessment/Plan 1. Dysuria - POC Urinalysis Dipstick- abnormal will send off for culture -encouraged to increase hydration -proper pericare encouraged - Urine Culture  2. Closed fracture of one rib of right side with routine healing, subsequent encounter Ongoing pain after fall with fracture to rib.  Continue to use tylenol 500 mg 2 tablets but can increase to every 8 hours as needed for pain -encouraged deep breathing.   3. Vascular dementia without behavioral disturbance ([place]) -ongoing, she is back in exam room alone, daughter called to verify medication. Continue supportive care.    Carlos American. [place], [facility] [phone]

## 2019-12-29 NOTE — Patient Instructions (Addendum)
Can use tylenol 500 mg 2 tablets every 8 hours for pain  Do not wear bra or tight clothing due to pain in ribs.   To increase hydration

## 2019-12-31 LAB — URINE CULTURE
MICRO NUMBER:: 11208164
SPECIMEN QUALITY:: ADEQUATE

## 2020-01-01 ENCOUNTER — Other Ambulatory Visit: Payer: Self-pay | Admitting: Internal Medicine

## 2020-01-01 ENCOUNTER — Other Ambulatory Visit: Payer: Self-pay

## 2020-01-01 DIAGNOSIS — M545 Low back pain, unspecified: Secondary | ICD-10-CM

## 2020-01-01 DIAGNOSIS — G6289 Other specified polyneuropathies: Secondary | ICD-10-CM

## 2020-01-01 MED ORDER — CEPHALEXIN 500 MG PO CAPS: 500.0000 mg | ORAL_CAPSULE | Freq: Three times a day (TID) | ORAL | 0 refills | Status: DC

## 2020-01-01 NOTE — Progress Notes (Signed)
Medication sent to pharmacy. Thank you!

## 2020-01-01 NOTE — Progress Notes (Signed)
Medication pend and sent to provider Sherrie Mustache, NP due to allergy warning. Please Advise.

## 2020-01-01 NOTE — Telephone Encounter (Signed)
Sent to Dr. Reed for approval  

## 2020-01-02 ENCOUNTER — Telehealth: Payer: Self-pay | Admitting: Physical Medicine and Rehabilitation

## 2020-01-02 NOTE — Telephone Encounter (Signed)
Patient's daughter requested MRI review with Bevely Palmer prior to [facility]. [facility] appointment cancelled and OV scheduled.

## 2020-01-02 NOTE — Telephone Encounter (Signed)
Patient's daughter called. Would like to know if this appointment could be [facility]. She did not get the results from the MRI. She started taking medication for UTI yesterday. Would like someone to call her. [phone]

## 2020-01-05 ENCOUNTER — Ambulatory Visit: Payer: Medicare Other | Admitting: Physical Medicine and Rehabilitation

## 2020-01-10 ENCOUNTER — Other Ambulatory Visit: Payer: Self-pay | Admitting: Internal Medicine

## 2020-01-12 ENCOUNTER — Encounter: Payer: Self-pay | Admitting: Physician Assistant

## 2020-01-12 ENCOUNTER — Ambulatory Visit (INDEPENDENT_AMBULATORY_CARE_PROVIDER_SITE_OTHER): Payer: Medicare Other | Admitting: Physician Assistant

## 2020-01-12 VITALS — Ht 63.0 in | Wt 155.0 lb

## 2020-01-12 DIAGNOSIS — M5440 Lumbago with sciatica, unspecified side: Secondary | ICD-10-CM

## 2020-01-12 NOTE — Progress Notes (Signed)
Office Visit Note   Patient: Ariana [name]           Date of Birth: [birth year]           MRN: [id number] Visit Date: 01/12/2020              Requested by: Gayland Curry, DO [place],  [place] [address] PCP: Gayland Curry, DO  Chief Complaint  Patient presents with  . Lower Back - Follow-up    MRI review       HPI: Patient is here to follow-up on her lumbar radiculopathy.  She did have an MRI and was to follow-up with Dr. Ernestina Patches for possible epidural steroid injections.  She is very nervous about doing it and her daughter thought she would benefit from reviewing the results with [place] first.  She took continues to pain radiating down her left lower extremity  Assessment & Plan: Visit Diagnoses: No diagnosis found.  Plan: MRI is reviewed with her and her daughter.  Significant findings of stenosis in disc changes at L4-5.  I told her that I would recommend trying an epidural steroid injection.  It is certainly not without risks but she should follow-up with Dr. Ernestina Patches as she has limited other options.  Follow-Up Instructions: No follow-ups on file.   Ortho Exam  Patient is alert, oriented, no adenopathy, well-dressed, normal affect, normal respiratory effort. Focused exam patient demonstrates no hip pain no pain with movement of the hip she does have radicular pain going down her left back and buttock into her leg.  She has good strength with dorsiflexion and plantarflexion  Imaging: No results found. No images are attached to the encounter.  Labs: Lab Results  Component Value Date   HGBA1C 6.0 (H) 03/07/2018   HGBA1C 6.4 (H) 09/04/2017   HGBA1C 9.1 (H) 03/22/2017   ESRSEDRATE 5 08/23/2012   ESRSEDRATE 16 07/18/2011   ESRSEDRATE 11 07/08/2010   REPTSTATUS 05/23/2019 FINAL 05/20/2019   CULT >=100,000 COLONIES/mL ESCHERICHIA COLI (A) 05/20/2019   LABORGA ESCHERICHIA COLI (A) 05/20/2019     Lab Results  Component Value Date   ALBUMIN 4.1 05/20/2019    ALBUMIN 4.5 03/04/2019   ALBUMIN 4.4 12/04/2018    Lab Results  Component Value Date   MG 2.0 04/24/2017   MG 2.0 04/23/2017   MG 2.0 06/29/2015   Lab Results  Component Value Date   VD25OH 52 05/16/2012    No results found for: PREALBUMIN CBC EXTENDED Latest Ref Rng & Units 05/20/2019 03/04/2019 12/13/2018  WBC 4.0 - 10.5 K/uL 6.1 7.0 8.0  RBC 3.87 - 5.11 MIL/uL 4.23 4.39 4.25  HGB 12.0 - 15.0 g/dL 13.2 13.7 13.4  HCT 36 - 46 % 40.9 42.2 40.9  PLT 150 - 400 K/uL 199 207 211  NEUTROABS 1.7 - 7.7 K/uL 3.3 4.0 5.4  LYMPHSABS 0.7 - 4.0 K/uL 1.8 2.2 1.6     Body mass index is 27.46 kg/m.  Orders:  No orders of the defined types were placed in this encounter.  No orders of the defined types were placed in this encounter.    Procedures: No procedures performed  Clinical Data: No additional findings.  ROS:  All other systems negative, except as noted in the HPI. Review of Systems  Objective: Vital Signs: Ht 5\' 3"  (1.6 m)   Wt 155 lb (70.3 kg)   BMI 27.46 kg/m   Specialty Comments:  No specialty comments available.  PMFS History: Patient Active  Problem List   Diagnosis Date Noted  . Diabetic neuropathy ([place]) 01/28/2019  . Diabetes mellitus due to underlying condition, uncontrolled ([place]) 09/04/2017  . History of rectal bleeding 09/04/2017  . A-fib ([place]) 04/23/2017  . Diabetes mellitus type 2 in nonobese ([place]) 04/23/2017  . Sjogren's disease ([place]) 04/03/2016  . Primary osteoarthritis of both hands 04/03/2016  . High risk medication use 04/03/2016  . Senile dementia, with behavioral disturbance ([place]) 08/16/2015  . Swelling 08/16/2015  . Diastolic dysfunction 49/70/2637  . Hypertonicity, bladder 07/08/2015  . Arterial hypotension   . Bradycardia 06/29/2015  . Chronic lower back pain 06/29/2015  . PAF (paroxysmal atrial fibrillation) ([place]) 04/18/2015  . Dehydration 04/18/2015  . Dementia without behavioral disturbance ([place]) 04/18/2015  . TIA (transient  ischemic attack) 12/17/2014  . Chest pain 12/16/2014  . Acute encephalopathy 12/04/2014  . Fall   . AKI (acute kidney injury) ([place]) 12/03/2014  . History of cerebrovascular disease 09/24/2014  . Falls frequently 07/28/2014  . Infarction of parietal lobe ([place])   . Mild dementia ([place])   . CVA (cerebral infarction) 07/26/2014  . Atrial fibrillation with RVR ([place]) 07/03/2014  . Constipation 04/15/2014  . Mixed stress and urge urinary incontinence 11/03/2013  . Therapeutic opioid induced constipation 11/03/2013  . Hemorrhoid 11/03/2013  . Low back pain associated with a spinal disorder other than radiculopathy or spinal stenosis 11/03/2013  . Protein-calorie malnutrition, severe ([place]) 07/22/2013  . UTI (lower urinary tract infection) 07/20/2013  . Prolonged QT interval 07/20/2013  . Osteopenia 07/17/2013  . Palpitations 06/30/2013  . Hereditary and idiopathic peripheral neuropathy 05/22/2013  . Abnormality of gait 12/05/2012  . Headache(784.0) 09/05/2012  . Multinodular thyroid 01/15/2012  . Neck pain 01/15/2012  . Hypercholesterolemia 07/08/2010  . Tear film insufficiency 07/06/2009  . Lewis SYNDROME 07/06/2009  . Urge urinary incontinence 01/06/2009  . COLONIC POLYPS, ADENOMATOUS, HX OF 04/15/2008  . MITRAL VALVE PROLAPSE 11/05/2007  . ANXIETY DEPRESSION 07/02/2007  . Mononeuritis 07/02/2007  . HYPERTENSION, BENIGN 07/02/2007  . GERD 07/02/2007  . Irritable bowel syndrome 07/02/2007  . Fibromyalgia 07/02/2007   Past Medical History:  Diagnosis Date  . Abnormality of gait   . Adenomatous polyp of colon 2002   9mm  . Allergic rhinitis   . Anxiety   . Anxiety and depression   . Chronic back pain   . Dementia without behavioral disturbance ([place])   . Depression   . Diabetes ([place])   . Diverticulosis of colon   . Dry eye syndrome   . Dysphagia   . Dysthymic disorder   . Fall   . Fibromyalgia   . GERD (gastroesophageal reflux disease)   . H/O hiatal hernia   .  History of adverse drug reaction   . History of cerebrovascular disease 09/24/2014  . History of recurrent UTIs   . Hypertension, benign   . Irritable bowel syndrome   . Low back pain syndrome   . Memory loss   . Mitral valve prolapse   . Paroxysmal A-fib ([place])   . Peripheral neuropathy    "both feet and legs"  . Physical deconditioning   . Sjogren's syndrome ([place])   . Therapeutic opioid-induced constipation ([facility])   . Thyroid nodule   . Urinary incontinence     Family History  Problem Relation Age of Onset  . Heart disease Father        heart attack  . Pneumonia Father   . Heart attack Mother   . Hypertension Mother   .  Colon cancer Sister   . Kidney disease Daughter   . Asthma Daughter   . Arthritis Daughter 67       osteo,  . Heart disease Son 1       stage 3 CHF(Diastolic /Systolic)  . Throat cancer Brother   . Hypertension Maternal Grandmother     Past Surgical History:  Procedure Laterality Date  . ABDOMINAL HYSTERECTOMY  1967  . APPENDECTOMY    . CARDIAC CATHETERIZATION  02/17/2003   normal L main, LAD free of disease, Cfx free of disease, RCA free of disease (Dr. Loni Muse. Little)  . CATARACT EXTRACTION, BILATERAL    . CHOLECYSTECTOMY  2000  . COLONOSCOPY W/ BIOPSIES     multiple  . DENTAL SURGERY     multiple tooth extractions  . ESOPHAGOGASTRODUODENOSCOPY (EGD) WITH ESOPHAGEAL DILATION N/A 08/23/2012   Procedure: ESOPHAGOGASTRODUODENOSCOPY (EGD) WITH ESOPHAGEAL DILATION;  Surgeon: Milus Banister, MD;  Location: [facility];  Service: Endoscopy;  Laterality: N/A;  . NASAL SEPTUM SURGERY  1980  . NM MYOCAR PERF WALL MOTION  2003   persantine - normal static and dynamic study w/apical thinning and presvered LV function, no ischemia  . SINUS EXPLORATION     ossifiying fibroma  . TEMPOROMANDIBULAR JOINT SURGERY  1986   Dr. Terence Lux  . TRANSTHORACIC ECHOCARDIOGRAM  2001   mild LVH, normal LV   Social History   Occupational History  . Occupation: Retired    Tobacco Use  . Smoking status: Former Research scientist (life sciences)  . Smokeless tobacco: Never Used  . Tobacco comment: Quit at age 14   Vaping Use  . Vaping Use: Never used  Substance and Sexual Activity  . Alcohol use: No  . Drug use: Never  . Sexual activity: Never

## 2020-01-13 ENCOUNTER — Telehealth: Payer: Self-pay | Admitting: Physical Medicine and Rehabilitation

## 2020-01-13 NOTE — Telephone Encounter (Signed)
-----   Message from Lendon Collar, RT sent at 01/12/2020  3:23 PM EST ----- Patient needs to be rescheduled for an injection with Dr.Newton, per Bevely Palmer Persons. Please contact daughter on file for scheduling.

## 2020-01-13 NOTE — Telephone Encounter (Signed)
Left message #1 to reschedule appointment.

## 2020-01-15 NOTE — Telephone Encounter (Signed)
Called patient's daughter again at number listed in [place]. No answer and no voicemail option.

## 2020-01-16 NOTE — Telephone Encounter (Signed)
Rescheduled

## 2020-01-29 ENCOUNTER — Other Ambulatory Visit: Payer: Self-pay | Admitting: Internal Medicine

## 2020-01-29 ENCOUNTER — Other Ambulatory Visit: Payer: Self-pay | Admitting: Family

## 2020-01-29 DIAGNOSIS — F341 Dysthymic disorder: Secondary | ICD-10-CM

## 2020-01-29 DIAGNOSIS — M545 Low back pain, unspecified: Secondary | ICD-10-CM

## 2020-01-29 DIAGNOSIS — M797 Fibromyalgia: Secondary | ICD-10-CM

## 2020-01-29 DIAGNOSIS — G6289 Other specified polyneuropathies: Secondary | ICD-10-CM

## 2020-01-29 NOTE — Telephone Encounter (Signed)
I called patient to confirm current dose of mediation. Patient states I need

## 2020-02-02 ENCOUNTER — Other Ambulatory Visit: Payer: Self-pay | Admitting: Rheumatology

## 2020-02-02 ENCOUNTER — Other Ambulatory Visit: Payer: Self-pay | Admitting: Internal Medicine

## 2020-02-02 DIAGNOSIS — E119 Type 2 diabetes mellitus without complications: Secondary | ICD-10-CM

## 2020-02-02 NOTE — Telephone Encounter (Signed)
Last Visit: 03/19/19 Next visit: 03/18/20  Okay to refill per Dr. Estanislado Pandy

## 2020-02-10 ENCOUNTER — Other Ambulatory Visit: Payer: Self-pay | Admitting: Internal Medicine

## 2020-02-17 ENCOUNTER — Ambulatory Visit (INDEPENDENT_AMBULATORY_CARE_PROVIDER_SITE_OTHER): Payer: Medicare Other | Admitting: Physical Medicine and Rehabilitation

## 2020-02-17 ENCOUNTER — Ambulatory Visit: Payer: Self-pay

## 2020-02-17 ENCOUNTER — Encounter: Payer: Self-pay | Admitting: Physical Medicine and Rehabilitation

## 2020-02-17 VITALS — BP 123/65 | HR 72

## 2020-02-17 DIAGNOSIS — M5416 Radiculopathy, lumbar region: Secondary | ICD-10-CM

## 2020-02-17 MED ORDER — METHYLPREDNISOLONE ACETATE 80 MG/ML IJ SUSP
80.0000 mg | Freq: Once | INTRAMUSCULAR | Status: AC
  Administered 2020-02-17: 80 mg

## 2020-02-17 NOTE — Progress Notes (Signed)
Pt state lower back pain that travels down her left leg. Pt state when walking off her left leg to her right she feels shooting pain from the foot to her back. Pt state she takes pain meds and use heating pads to help ease the pain.  Numeric Pain Rating Scale and Functional Assessment Average Pain 10   In the last MONTH (on 0-10 scale) has pain interfered with the following?  1. General activity like being  able to carry out your everyday physical activities such as walking, climbing stairs, carrying groceries, or moving a chair?  Rating(10)   +Driver, -BT, -Dye Allergies.

## 2020-02-17 NOTE — Progress Notes (Signed)
Ariana [name] - 87 y.o. female MRN [id number]  Date of birth: 1929/08/14  Office Visit Note: Visit Date: 02/17/2020 PCP: Gayland Curry, DO Referred by: Gayland Curry, DO  Subjective: Chief Complaint  Patient presents with  . Lower Back - Pain  . Left Foot - Pain  . Left Leg - Pain   HPI:  Ariana [name] is a 87 y.o. female who comes in today at the request of [place], PA-C for planned Left L4-L5 Lumbar epidural steroid injection with fluoroscopic guidance.  The patient has failed conservative care including home exercise, medications, time and activity modification.  This injection will be diagnostic and hopefully therapeutic.  Please see requesting physician notes for further details and justification.  MRI reviewed with images and spine model.  MRI reviewed in the note below.    ROS Otherwise per HPI.  Assessment & Plan: Visit Diagnoses:    ICD-10-CM   1. Lumbar radiculopathy  M54.16 XR C-ARM NO REPORT    Epidural Steroid injection    methylPREDNISolone acetate (DEPO-MEDROL) injection 80 mg    Plan: No additional findings.   Meds & Orders:  Meds ordered this encounter  Medications  . methylPREDNISolone acetate (DEPO-MEDROL) injection 80 mg    Orders Placed This Encounter  Procedures  . XR C-ARM NO REPORT  . Epidural Steroid injection    Follow-up: Return for visit to requesting physician as needed.   Procedures: No procedures performed  Lumbosacral Transforaminal Epidural Steroid Injection - Sub-Pedicular Approach with Fluoroscopic Guidance  Patient: Ariana [name]      Date of Birth: 1929-05-29 MRN: [id number] PCP: Gayland Curry, DO      Visit Date: 02/17/2020   Universal Protocol:    Date/Time: 02/17/2020  Consent Given By: the patient  Position: PRONE  Additional Comments: Vital signs were monitored before and after the procedure. Patient was prepped and draped in the usual sterile fashion. The correct patient, procedure, and site  was verified.   Injection Procedure Details:   Procedure diagnoses: Lumbar radiculopathy [M54.16]    Meds Administered:  Meds ordered this encounter  Medications  . methylPREDNISolone acetate (DEPO-MEDROL) injection 80 mg    Laterality: Left  Location/Site:  L4-L5  Needle:5.0 in., 22 ga.  Short bevel or Quincke spinal needle  Needle Placement: Transforaminal  Findings:    -Comments: Excellent flow of contrast along the nerve, nerve root and into the epidural space.  Procedure Details: After squaring off the end-plates to get a true AP view, the C-arm was positioned so that an oblique view of the foramen as noted above was visualized. The target area is just inferior to the "nose of the scotty dog" or sub pedicular. The soft tissues overlying this structure were infiltrated with 2-3 ml. of 1% Lidocaine without Epinephrine.  The spinal needle was inserted toward the target using a "trajectory" view along the fluoroscope beam.  Under AP and lateral visualization, the needle was advanced so it did not puncture dura and was located close the 6 O'Clock position of the pedical in AP tracterory. Biplanar projections were used to confirm position. Aspiration was confirmed to be negative for CSF and/or blood. A 1-2 ml. volume of Isovue-250 was injected and flow of contrast was noted at each level. Radiographs were obtained for documentation purposes.   After attaining the desired flow of contrast documented above, a 0.5 to 1.0 ml test dose of 0.25% Marcaine was injected into each respective transforaminal space.  The patient  was observed for 90 seconds post injection.  After no sensory deficits were reported, and normal lower extremity motor function was noted,   the above injectate was administered so that equal amounts of the injectate were placed at each foramen (level) into the transforaminal epidural space.   Additional Comments:  The patient tolerated the procedure well Dressing: 2 x  2 sterile gauze and Band-Aid    Post-procedure details: Patient was observed during the procedure. Post-procedure instructions were reviewed.  Patient left the clinic in stable condition.      Clinical History: MRI LUMBAR SPINE WITHOUT CONTRAST  TECHNIQUE: Multiplanar, multisequence MR imaging of the lumbar spine was performed. No intravenous contrast was administered.  COMPARISON:  09/25/2019 lumbar spine radiographs and prior.  FINDINGS: Segmentation:  Standard.  Alignment:  Straightening of lordosis.  Vertebrae: L5-S1 Modic type 2 endplate degenerative changes. Multilevel Schmorl's node formation. L1, L3 and S1 hemangiomata.  Conus medullaris and cauda equina: Conus extends to the L2 level. Conus and cauda equina appear normal.  Disc levels: Multilevel desiccation and disc space loss most prominent at the L5-S1 level.  L1-2: Minimal disc bulge and bilateral facet hypertrophy. Patent spinal canal and neural foramen.  L2-3: Mild disc bulge, ligamentum flavum and bilateral facet hypertrophy. Small left foraminal protrusion. Prominent dorsal epidural fat. Mild spinal canal and left neural foraminal narrowing. Patent right neural foramen.  L3-4: Disc bulge with superimposed right foraminal protrusion, ligamentum flavum and bilateral facet hypertrophy. Prominent dorsal epidural fat. Mild spinal canal and bilateral neural foraminal narrowing.  L4-5: Disc bulge with superimposed central protrusion abutting the left greater than right descending L5 nerve roots. Small left foraminal protrusion grazing the exiting left L4 nerve root. Ligamentum flavum and bilateral facet hypertrophy. Prominent dorsal epidural fat. Moderate to severe spinal canal and bilateral neural foraminal narrowing.  L5-S1: Disc bulge with superimposed central protrusion and bilateral facet hypertrophy. Patent spinal canal. Mild bilateral neural foraminal narrowing.  Paraspinal and  other soft tissues: Bilateral renal cyst. Paraspinal soft tissues within normal limits.  IMPRESSION: 1. Lumbar spondylosis as outlined and most notably as follows. 2. At L4-5, there is a disc bulge with superimposed central protrusion abutting the left greater than right descending L5 nerve roots. Small left foraminal protrusion grazing the exiting left L4 nerve root. Moderate to severe spinal canal and bilateral neural foraminal narrowing at this level. 3. Mild spinal canal and neural foraminal narrowing at L2-L3 and L3-L4. 4. Mild bilateral L5-S1 neural foraminal narrowing.   Electronically Signed   By: Stana Bunting M.D.   On: 11/28/2019 13:53     Objective:  VS:  HT:    WT:   BMI:     BP:123/65  HR:72bpm  TEMP: ( )  RESP:  Physical Exam   Imaging: No results found.

## 2020-02-17 NOTE — Procedures (Signed)
Lumbosacral Transforaminal Epidural Steroid Injection - Sub-Pedicular Approach with Fluoroscopic Guidance  Patient: Ariana [name]      Date of Birth: 19-Jun-1929 MRN: [id number] PCP: Kermit Balo, DO      Visit Date: 02/17/2020   Universal Protocol:    Date/Time: 02/17/2020  Consent Given By: the patient  Position: PRONE  Additional Comments: Vital signs were monitored before and after the procedure. Patient was prepped and draped in the usual sterile fashion. The correct patient, procedure, and site was verified.   Injection Procedure Details:   Procedure diagnoses: Lumbar radiculopathy [M54.16]    Meds Administered:  Meds ordered this encounter  Medications  . methylPREDNISolone acetate (DEPO-MEDROL) injection 80 mg    Laterality: Left  Location/Site:  L4-L5  Needle:5.0 in., 22 ga.  Short bevel or Quincke spinal needle  Needle Placement: Transforaminal  Findings:    -Comments: Excellent flow of contrast along the nerve, nerve root and into the epidural space.  Procedure Details: After squaring off the end-plates to get a true AP view, the C-arm was positioned so that an oblique view of the foramen as noted above was visualized. The target area is just inferior to the "nose of the scotty dog" or sub pedicular. The soft tissues overlying this structure were infiltrated with 2-3 ml. of 1% Lidocaine without Epinephrine.  The spinal needle was inserted toward the target using a "trajectory" view along the fluoroscope beam.  Under AP and lateral visualization, the needle was advanced so it did not puncture dura and was located close the 6 O'Clock position of the pedical in AP tracterory. Biplanar projections were used to confirm position. Aspiration was confirmed to be negative for CSF and/or blood. A 1-2 ml. volume of Isovue-250 was injected and flow of contrast was noted at each level. Radiographs were obtained for documentation purposes.   After attaining the desired  flow of contrast documented above, a 0.5 to 1.0 ml test dose of 0.25% Marcaine was injected into each respective transforaminal space.  The patient was observed for 90 seconds post injection.  After no sensory deficits were reported, and normal lower extremity motor function was noted,   the above injectate was administered so that equal amounts of the injectate were placed at each foramen (level) into the transforaminal epidural space.   Additional Comments:  The patient tolerated the procedure well Dressing: 2 x 2 sterile gauze and Band-Aid    Post-procedure details: Patient was observed during the procedure. Post-procedure instructions were reviewed.  Patient left the clinic in stable condition.

## 2020-02-26 ENCOUNTER — Other Ambulatory Visit: Payer: Self-pay | Admitting: Internal Medicine

## 2020-02-26 DIAGNOSIS — M545 Low back pain, unspecified: Secondary | ICD-10-CM

## 2020-02-26 DIAGNOSIS — G6289 Other specified polyneuropathies: Secondary | ICD-10-CM

## 2020-02-27 ENCOUNTER — Telehealth: Payer: Self-pay | Admitting: *Deleted

## 2020-02-27 NOTE — Telephone Encounter (Signed)
Patient daughter notified and agreed.  

## 2020-02-27 NOTE — Telephone Encounter (Signed)
These readings are not at concerning levels.  If she gets blood sugars over 250 on a regular basis or blood pressures up over 160 top number, I'd be more concerned.

## 2020-02-27 NOTE — Telephone Encounter (Signed)
Mauri, daughter called and stated that patient had an Injection done last week at the Orthopaedic's office, Dr. Ernestina Patches, and ever since patient's blood sugar has been running high. Stated that is was 195 yesterday afternoon. Blood pressure was also up to 143/93. Stated that the blood sugar and blood pressures are usually on the lower end.  Daughter is just concerned and wanted to know your advise.  Stated patient is scheduled for another shot at the end of the month and has concerns getting it due to the increase in blood sugars.   Please Advise.

## 2020-03-04 NOTE — Progress Notes (Deleted)
Office Visit Note  Patient: Ariana [name]             Date of Birth: 06/18/1929           MRN: [id number]             PCP: Gayland Curry, DO Referring: Gayland Curry, DO Visit Date: 03/18/2020 Occupation: @GUAROCC @  Subjective:  No chief complaint on file.   History of Present Illness: Ariana [name] is a 87 y.o. female ***   Activities of Daily Living:  Patient reports morning stiffness for *** {minute/hour:19697}.   Patient {ACTIONS;DENIES/REPORTS:21021675::"Denies"} nocturnal pain.  Difficulty dressing/grooming: {ACTIONS;DENIES/REPORTS:21021675::"Denies"} Difficulty climbing stairs: {ACTIONS;DENIES/REPORTS:21021675::"Denies"} Difficulty getting out of chair: {ACTIONS;DENIES/REPORTS:21021675::"Denies"} Difficulty using hands for taps, buttons, cutlery, and/or writing: {ACTIONS;DENIES/REPORTS:21021675::"Denies"}  No Rheumatology ROS completed.   PMFS History:  Patient Active Problem List   Diagnosis Date Noted  . Diabetic neuropathy ([place]) 01/28/2019  . Diabetes mellitus due to underlying condition, uncontrolled ([place]) 09/04/2017  . History of rectal bleeding 09/04/2017  . A-fib ([place]) 04/23/2017  . Diabetes mellitus type 2 in nonobese ([place]) 04/23/2017  . Sjogren's disease ([place]) 04/03/2016  . Primary osteoarthritis of both hands 04/03/2016  . High risk medication use 04/03/2016  . Senile dementia, with behavioral disturbance ([place]) 08/16/2015  . Swelling 08/16/2015  . Diastolic dysfunction 95/28/4132  . Hypertonicity, bladder 07/08/2015  . Arterial hypotension   . Bradycardia 06/29/2015  . Chronic lower back pain 06/29/2015  . PAF (paroxysmal atrial fibrillation) ([place]) 04/18/2015  . Dehydration 04/18/2015  . Dementia without behavioral disturbance ([place]) 04/18/2015  . TIA (transient ischemic attack) 12/17/2014  . Chest pain 12/16/2014  . Acute encephalopathy 12/04/2014  . Fall   . AKI (acute kidney injury) ([place]) 12/03/2014  . History of cerebrovascular disease  09/24/2014  . Falls frequently 07/28/2014  . Infarction of parietal lobe ([place])   . Mild dementia ([place])   . CVA (cerebral infarction) 07/26/2014  . Atrial fibrillation with RVR ([place]) 07/03/2014  . Constipation 04/15/2014  . Mixed stress and urge urinary incontinence 11/03/2013  . Therapeutic opioid induced constipation 11/03/2013  . Hemorrhoid 11/03/2013  . Low back pain associated with a spinal disorder other than radiculopathy or spinal stenosis 11/03/2013  . Protein-calorie malnutrition, severe ([place]) 07/22/2013  . UTI (lower urinary tract infection) 07/20/2013  . Prolonged QT interval 07/20/2013  . Osteopenia 07/17/2013  . Palpitations 06/30/2013  . Hereditary and idiopathic peripheral neuropathy 05/22/2013  . Abnormality of gait 12/05/2012  . Headache(784.0) 09/05/2012  . Multinodular thyroid 01/15/2012  . Neck pain 01/15/2012  . Hypercholesterolemia 07/08/2010  . Tear film insufficiency 07/06/2009  . [place] SYNDROME 07/06/2009  . Urge urinary incontinence 01/06/2009  . COLONIC POLYPS, ADENOMATOUS, HX OF 04/15/2008  . MITRAL VALVE PROLAPSE 11/05/2007  . ANXIETY DEPRESSION 07/02/2007  . Mononeuritis 07/02/2007  . HYPERTENSION, BENIGN 07/02/2007  . GERD 07/02/2007  . Irritable bowel syndrome 07/02/2007  . Fibromyalgia 07/02/2007    Past Medical History:  Diagnosis Date  . Abnormality of gait   . Adenomatous polyp of colon 2002   25mm  . Allergic rhinitis   . Anxiety   . Anxiety and depression   . Chronic back pain   . Dementia without behavioral disturbance ([place])   . Depression   . Diabetes ([place])   . Diverticulosis of colon   . Dry eye syndrome   . Dysphagia   . Dysthymic disorder   . Fall   . Fibromyalgia   . GERD (gastroesophageal reflux disease)   .  H/O hiatal hernia   . History of adverse drug reaction   . History of cerebrovascular disease 09/24/2014  . History of recurrent UTIs   . Hypertension, benign   . Irritable bowel syndrome   . Low back pain  syndrome   . Memory loss   . Mitral valve prolapse   . Paroxysmal A-fib ([place])   . Peripheral neuropathy    "both feet and legs"  . Physical deconditioning   . Sjogren's syndrome ([place])   . Therapeutic opioid-induced constipation ([facility])   . Thyroid nodule   . Urinary incontinence     Family History  Problem Relation Age of Onset  . Heart disease Father        heart attack  . Pneumonia Father   . Heart attack Mother   . Hypertension Mother   . Colon cancer Sister   . Kidney disease Daughter   . Asthma Daughter   . Arthritis Daughter 30       osteo,  . Heart disease Son 57       stage 3 CHF(Diastolic /Systolic)  . Throat cancer Brother   . Hypertension Maternal Grandmother    Past Surgical History:  Procedure Laterality Date  . ABDOMINAL HYSTERECTOMY  1967  . APPENDECTOMY    . CARDIAC CATHETERIZATION  02/17/2003   normal L main, LAD free of disease, Cfx free of disease, RCA free of disease (Dr. Loni Muse. [place])  . CATARACT EXTRACTION, BILATERAL    . CHOLECYSTECTOMY  2000  . COLONOSCOPY W/ BIOPSIES     multiple  . DENTAL SURGERY     multiple tooth extractions  . ESOPHAGOGASTRODUODENOSCOPY (EGD) WITH ESOPHAGEAL DILATION N/A 08/23/2012   Procedure: ESOPHAGOGASTRODUODENOSCOPY (EGD) WITH ESOPHAGEAL DILATION;  Surgeon: Milus Banister, MD;  Location: [facility];  Service: Endoscopy;  Laterality: N/A;  . NASAL SEPTUM SURGERY  1980  . NM MYOCAR PERF WALL MOTION  2003   persantine - normal static and dynamic study w/apical thinning and presvered LV function, no ischemia  . SINUS EXPLORATION     ossifiying fibroma  . TEMPOROMANDIBULAR JOINT SURGERY  1986   Dr. Terence Lux  . 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    Patient is Widowed.    Patient has 2 children.    Patient is retired.    Former smoker   Alcohol none   Exercise Walk, exercise chair 4 days a week    POA    Walks with cane      Patient drinks about 1-2 cups of hot tea daily.   Patient is right handed.               Immunization History  Administered Date(s) Administered  . Fluad Quad(high Dose 65+) 11/08/2018, 11/24/2019  . Influenza Split 12/26/2010, 01/15/2012  . Influenza Whole 12/30/2008, 11/15/2009  . Influenza, High Dose Seasonal PF 12/21/2016, 11/05/2017  . Influenza,inj,Quad PF,6+ Mos 02/04/2013, 11/03/2013, 12/05/2014, 12/13/2015  . Moderna Sars-Covid-2 Vaccination 04/17/2019, 05/15/2019  . Pneumococcal Conjugate-13 06/12/2014  . Pneumococcal Polysaccharide-23 02/15/2009, 12/05/2014  . Tdap 12/08/2013  . Zoster 01/29/2012     Objective: Vital Signs: There were no vitals taken for this visit.   Physical Exam   Musculoskeletal Exam: ***  CDAI Exam: CDAI Score: -- Patient Global: --; Provider Global: -- Swollen: --; Tender: -- Joint Exam 03/18/2020   No joint exam has  been documented for this visit   There is currently no information documented on the homunculus. Go to the Rheumatology activity and complete the homunculus joint exam.  Investigation: No additional findings.  Imaging: Epidural Steroid injection  Result Date: 02/17/2020 Magnus Sinning, MD     02/17/2020  3:15 PM Lumbosacral Transforaminal Epidural Steroid Injection - Sub-Pedicular Approach with Fluoroscopic Guidance Patient: Ariana [name]     Date of Birth: 1929/11/10 MRN: [id number] PCP: Gayland Curry, DO     Visit Date: 02/17/2020  Universal Protocol:   Date/Time: 02/17/2020 Consent Given By: the patient Position: PRONE Additional Comments: Vital signs were monitored before and after the procedure. Patient was prepped and draped in the usual sterile fashion. The correct patient, procedure, and site was verified. Injection Procedure Details: Procedure diagnoses: Lumbar radiculopathy [M54.16]  Meds Administered: Meds ordered this encounter Medications . methylPREDNISolone acetate (DEPO-MEDROL)  injection 80 mg Laterality: Left Location/Site: L4-L5 Needle:5.0 in., 22 ga.  Short bevel or Quincke spinal needle Needle Placement: Transforaminal Findings:   -Comments: Excellent flow of contrast along the nerve, nerve root and into the epidural space. Procedure Details: After squaring off the end-plates to get a true AP view, the C-arm was positioned so that an oblique view of the foramen as noted above was visualized. The target area is just inferior to the "nose of the scotty dog" or sub pedicular. The soft tissues overlying this structure were infiltrated with 2-3 ml. of 1% Lidocaine without Epinephrine. The spinal needle was inserted toward the target using a "trajectory" view along the fluoroscope beam.  Under AP and lateral visualization, the needle was advanced so it did not puncture dura and was located close the 6 O'Clock position of the pedical in AP tracterory. Biplanar projections were used to confirm position. Aspiration was confirmed to be negative for CSF and/or blood. A 1-2 ml. volume of Isovue-250 was injected and flow of contrast was noted at each level. Radiographs were obtained for documentation purposes. After attaining the desired flow of contrast documented above, a 0.5 to 1.0 ml test dose of 0.25% Marcaine was injected into each respective transforaminal space.  The patient was observed for 90 seconds post injection.  After no sensory deficits were reported, and normal lower extremity motor function was noted,   the above injectate was administered so that equal amounts of the injectate were placed at each foramen (level) into the transforaminal epidural space. Additional Comments: The patient tolerated the procedure well Dressing: 2 x 2 sterile gauze and Band-Aid  Post-procedure details: Patient was observed during the procedure. Post-procedure instructions were reviewed. Patient left the clinic in stable condition.   XR C-ARM NO REPORT  Result Date: 02/17/2020 Please see Notes tab for  imaging impression.   Recent Labs: Lab Results  Component Value Date   WBC 6.1 05/20/2019   HGB 13.2 05/20/2019   PLT 199 05/20/2019   NA 139 05/20/2019   K 4.3 05/20/2019   CL 103 05/20/2019   CO2 26 05/20/2019   GLUCOSE 110 (H) 05/20/2019   BUN 17 05/20/2019   CREATININE 0.55 05/20/2019   BILITOT 0.4 05/20/2019   ALKPHOS 90 05/20/2019   AST 21 05/20/2019   ALT 26 05/20/2019   PROT 7.4 05/20/2019   ALBUMIN 4.1 05/20/2019   CALCIUM 9.5 05/20/2019   GFRAA >60 05/20/2019    Speciality Comments: PLQ eye exam: 10/10/2017 Normal. Dr. Prudencio Burly. Follow up in 12 months.  Procedures:  No procedures performed Allergies: Banana, Codeine, Klonopin [clonazepam], Meperidine  hcl, Norflex [orphenadrine citrate], Oxycodone-acetaminophen, Propoxyphene hcl, Zoloft [sertraline hcl], Doxycycline, Naproxen, Penicillins, Phenothiazines, Stelazine, Sulfamethoxazole-trimethoprim, Tolectin [tolmetin sodium], and Tramadol   Assessment / Plan:     Visit Diagnoses: No diagnosis found.  Orders: No orders of the defined types were placed in this encounter.  No orders of the defined types were placed in this encounter.   Face-to-face time spent with patient was *** minutes. Greater than 50% of time was spent in counseling and coordination of care.  Follow-Up Instructions: No follow-ups on file.   Earnestine Mealing, CMA  Note - This record has been created using Editor, commissioning.  Chart creation errors have been sought, but may not always  have been located. Such creation errors do not reflect on  the standard of medical care.

## 2020-03-10 ENCOUNTER — Other Ambulatory Visit: Payer: Self-pay | Admitting: Internal Medicine

## 2020-03-10 DIAGNOSIS — K2100 Gastro-esophageal reflux disease with esophagitis, without bleeding: Secondary | ICD-10-CM

## 2020-03-15 ENCOUNTER — Other Ambulatory Visit: Payer: Self-pay | Admitting: *Deleted

## 2020-03-15 ENCOUNTER — Ambulatory Visit: Payer: Medicare Other | Admitting: Family

## 2020-03-15 DIAGNOSIS — K2100 Gastro-esophageal reflux disease with esophagitis, without bleeding: Secondary | ICD-10-CM

## 2020-03-15 MED ORDER — LANSOPRAZOLE 30 MG PO CPDR: DELAYED_RELEASE_CAPSULE | ORAL | 1 refills | Status: DC

## 2020-03-15 NOTE — Telephone Encounter (Signed)
Patient daughter requested refill.  

## 2020-03-16 ENCOUNTER — Ambulatory Visit (INDEPENDENT_AMBULATORY_CARE_PROVIDER_SITE_OTHER): Payer: Medicare Other | Admitting: Family

## 2020-03-16 ENCOUNTER — Other Ambulatory Visit: Payer: Self-pay

## 2020-03-16 ENCOUNTER — Encounter: Payer: Self-pay | Admitting: Family

## 2020-03-16 VITALS — BP 100/50 | HR 72 | Temp 97.7°F | Resp 16 | Ht 63.0 in | Wt 150.2 lb

## 2020-03-16 DIAGNOSIS — R399 Unspecified symptoms and signs involving the genitourinary system: Secondary | ICD-10-CM | POA: Diagnosis not present

## 2020-03-16 LAB — POCT URINALYSIS DIPSTICK
Glucose, UA: NEGATIVE
Nitrite, UA: NEGATIVE
Protein, UA: POSITIVE — AB
Spec Grav, UA: >=1.03 — AB (ref 1.010–1.025)
Urobilinogen, UA: NEGATIVE E.U./dL — AB
pH, UA: 5 (ref 5.0–8.0)

## 2020-03-16 NOTE — Progress Notes (Signed)
Provider: Coulton Schlink FNP-C  Ariana Curry, DO  Patient Care Team: Ariana Curry, DO as PCP - General (Geriatric Medicine) Ariana Space, MD as Consulting Physician (Pulmonary Disease) Ariana Fus, MD as Consulting Physician (Obstetrics and Gynecology) Ariana Pickett Nadean Corwin, MD as Consulting Physician (Cardiology) Ariana Fam, MD as Consulting Physician (Ophthalmology) Ariana Ducking, MD as Consulting Physician (Neurology) Ariana Gallo, MD as Consulting Physician (Urology) Ariana Monarch, MD as Consulting Physician (Dermatology) Ariana Mayer, MD as Consulting Physician (Gastroenterology) Estill Dooms as Social Worker  Extended Emergency Contact Information Primary Emergency Contact: Ariana [name] Address: [address]          [place], [place] [address] Ariana [name] Phone: [phone] Relation: Daughter Secondary Emergency Contact: Ariana [name] [place] of [place] Mobile Phone: [phone] Relation: Other  Code Status:  Full Code  Goals of care: Advanced Directive information Advanced Directives 03/16/2020  Does Patient Have a Medical Advance Directive? Yes  Type of Advance Directive Ariana [name]  Does patient want to make changes to medical advance directive? No - Patient declined  Copy of [place] in Chart? Yes - validated most recent copy scanned in chart (See row information)  Would patient like information on creating a medical advance directive? -  Pre-existing out of facility DNR order (yellow form or pink MOST form) -     Chief Complaint  Patient presents with  . Acute Visit    Possible UTI    HPI:  Pt is a 87 y.o. female seen today for an acute visit for evaluation of urine frequency.States sometimes uses the bathroom but by the time she returns to the living she has to go back to the bathroom. Has had burning and itching with urination  for several  weeks.Has had lower abdominal pain and feels tight.lower back pain. Has chronic nausea but no vomiting.No fever or chills.    Past Medical History:  Diagnosis Date  . Abnormality of gait   . Adenomatous polyp of colon 2002   53mm  . Allergic rhinitis   . Anxiety   . Anxiety and depression   . Chronic back pain   . Dementia without behavioral disturbance ([place])   . Depression   . Diabetes ([place])   . Diverticulosis of colon   . Dry eye syndrome   . Dysphagia   . Dysthymic disorder   . Fall   . Fibromyalgia   . GERD (gastroesophageal reflux disease)   . H/O hiatal hernia   . History of adverse drug reaction   . History of cerebrovascular disease 09/24/2014  . History of recurrent UTIs   . Hypertension, benign   . Irritable bowel syndrome   . Low back pain syndrome   . Memory loss   . Mitral valve prolapse   . Paroxysmal A-fib ([place])   . Peripheral neuropathy    "both feet and legs"  . Physical deconditioning   . Sjogren's syndrome ([place])   . Therapeutic opioid-induced constipation (OIC)   . Thyroid nodule   . Urinary incontinence    Past Surgical History:  Procedure Laterality Date  . ABDOMINAL HYSTERECTOMY  1967  . APPENDECTOMY    . CARDIAC CATHETERIZATION  02/17/2003   normal L main, LAD free of disease, Cfx free of disease, RCA free of disease (Dr. Loni Muse. Little)  . CATARACT EXTRACTION, BILATERAL    . CHOLECYSTECTOMY  2000  . COLONOSCOPY W/ BIOPSIES  multiple  . DENTAL SURGERY     multiple tooth extractions  . ESOPHAGOGASTRODUODENOSCOPY (EGD) WITH ESOPHAGEAL DILATION N/A 08/23/2012   Procedure: ESOPHAGOGASTRODUODENOSCOPY (EGD) WITH ESOPHAGEAL DILATION;  Surgeon: Milus Banister, MD;  Location: [facility];  Service: Endoscopy;  Laterality: N/A;  . NASAL SEPTUM SURGERY  1980  . NM MYOCAR PERF WALL MOTION  2003   persantine - normal static and dynamic study w/apical thinning and presvered LV function, no ischemia  . SINUS EXPLORATION     ossifiying fibroma  .  TEMPOROMANDIBULAR JOINT SURGERY  1986   Dr. Terence Lux  . TRANSTHORACIC ECHOCARDIOGRAM  2001   mild LVH, normal LV    Allergies  Allergen Reactions  . Banana Nausea And Vomiting  . Codeine Nausea Only    unless given with Phenergan  . Klonopin [Clonazepam] Other (See Comments)    Causes hallucination   . Meperidine Hcl Nausea Only    unless given with Phenergan  . Norflex [Orphenadrine Citrate] Nausea Only    Unless given with Phenergan  . Oxycodone-Acetaminophen Nausea Only    unless given with phenergan  . Propoxyphene Hcl Nausea Only    unless given with phenergan  . Zoloft [Sertraline Hcl] Other (See Comments)    Caused lethargy  . Doxycycline Other (See Comments)    Unknown reaction  . Naproxen Other (See Comments)    Unknown reaction  . Penicillins Other (See Comments)    Unknown reaction Has patient had a PCN reaction causing immediate rash, facial/tongue/throat swelling, SOB or lightheadedness with hypotension: Unknown Has patient had a PCN reaction causing severe rash involving mucus membranes or skin necrosis: Unknown Has patient had a PCN reaction that required hospitalization: pt was in the hospital at time of reaction Has patient had a PCN reaction occurring within the last 10 years: Unknown If all of the above answers are "NO", then may proceed with Cephalos  . Phenothiazines Other (See Comments)    Unknown reaction  . Stelazine Other (See Comments)    Unknown reaction  . Sulfamethoxazole-Trimethoprim Other (See Comments)    Unknown reaction  . Tolectin [Tolmetin Sodium] Other (See Comments)    Unknown reaction  . Tramadol Other (See Comments)    Unknown reaction    Outpatient Encounter Medications as of 03/16/2020  Medication Sig  . acetaminophen (TYLENOL) 500 MG tablet Take 1,000 mg by mouth 2 (two) times daily.  Marland Kitchen ALPRAZolam (XANAX) 0.25 MG tablet Take 1 tablet by mouth twice daily as needed  . antiseptic oral rinse (BIOTENE) LIQD 15 mLs by Mouth Rinse  route 5 (five) times daily as needed for dry mouth.  Marland Kitchen aspirin 325 MG tablet Take 1 tablet (325 mg total) by mouth daily.  . Capsaicin-Menthol-Methyl Sal (CAPSAICIN-METHYL SAL-MENTHOL) 0.025-1-12 % CREA Apply 1 application topically 2 (two) times daily.  . carboxymethylcellulose (REFRESH PLUS) 0.5 % SOLN Place 1 drop into both eyes 5 (five) times daily as needed (dry eyes).  . cephALEXin (KEFLEX) 500 MG capsule Take 1 capsule (500 mg total) by mouth in the morning, at noon, and at bedtime.  Marland Kitchen CRANBERRY PO Take 2 tablets by mouth daily.  Marland Kitchen diltiazem (CARDIZEM CD) 180 MG 24 hr capsule TAKE 1 CAPSULE BY MOUTH ONCE DAILY  . DULoxetine (CYMBALTA) 30 MG capsule Take 1 capsule by mouth daly for back pain and depression in combination with 60 mg tablet  . DULoxetine (CYMBALTA) 60 MG capsule Take 1 capsule by mouth once daily  . furosemide (LASIX) 40 MG tablet Take 1  tablet by mouth once daily  . glucose blood (ONETOUCH VERIO) test strip Use to test blood sugar daily. Dx:E08.65  . JANUVIA 100 MG tablet Take 1 tablet by mouth once daily  . Lancets Micro Thin 33G MISC Use to test blood sugar daily. Dx: E08.65  . lansoprazole (PREVACID) 30 MG capsule TAKE 1 CAPSULE BY MOUTH ONCE DAILY AT NOON  . linaclotide (LINZESS) 290 MCG CAPS capsule TAKE 1 CAPSULE BY MOUTH EVERY DAY AT BEDTIME  . loratadine (CLARITIN) 10 MG tablet Take 10 mg by mouth daily.  . memantine (NAMENDA) 10 MG tablet Take 1 tablet by mouth twice daily  . MYRBETRIQ 50 MG TB24 tablet Take 1 tablet by mouth once daily  . pilocarpine (SALAGEN) 5 MG tablet Take 1 tablet by mouth twice daily  . polyethylene glycol (MIRALAX / GLYCOLAX) packet Take by mouth daily as needed (constipation). Mix 1/2 capful of miralax in 8 oz liquid and drink  . potassium chloride SA (KLOR-CON) 20 MEQ tablet Take 1 tablet by mouth once daily  . pregabalin (LYRICA) 100 MG capsule Take 1 capsule by mouth twice daily   No facility-administered encounter medications on file  as of 03/16/2020.    Review of Systems  Constitutional: Negative for appetite change, chills, fatigue and fever.  Respiratory: Negative for cough, chest tightness, shortness of breath and wheezing.   Cardiovascular: Negative for chest pain, palpitations and leg swelling.  Gastrointestinal: Positive for abdominal pain. Negative for abdominal distention, constipation, diarrhea, nausea and vomiting.  Genitourinary: Positive for dysuria, frequency and urgency. Negative for difficulty urinating and flank pain.  Musculoskeletal: Positive for back pain and gait problem.  Psychiatric/Behavioral: Negative for agitation, behavioral problems and sleep disturbance. The patient is not nervous/anxious.     Immunization History  Administered Date(s) Administered  . Fluad Quad(high Dose 65+) 11/08/2018, 11/24/2019  . Influenza Split 12/26/2010, 01/15/2012  . Influenza Whole 12/30/2008, 11/15/2009  . Influenza, High Dose Seasonal PF 12/21/2016, 11/05/2017  . Influenza,inj,Quad PF,6+ Mos 02/04/2013, 11/03/2013, 12/05/2014, 12/13/2015  . Moderna Sars-Covid-2 Vaccination 04/17/2019, 05/15/2019  . Pneumococcal Conjugate-13 06/12/2014  . Pneumococcal Polysaccharide-23 02/15/2009, 12/05/2014  . Tdap 12/08/2013  . Zoster 01/29/2012   Pertinent  Health Maintenance Due  Topic Date Due  . OPHTHALMOLOGY EXAM  Never done  . HEMOGLOBIN A1C  09/05/2018  . FOOT EXAM  01/28/2020  . INFLUENZA VACCINE  Completed  . DEXA SCAN  Completed  . PNA vac Low Risk Adult  Completed  . URINE MICROALBUMIN  Discontinued   Fall Risk  03/16/2020 12/29/2019 12/09/2019 12/09/2019 11/24/2019  Falls in the past year? 1 1 - 1 0  Number falls in past yr: 1 1 - 0 0  Injury with Fall? 0 1 1 1  0  Comment - rib fracture sprined hand, shoulder hurt sprained ankle -  Risk Factor Category  - - - - -  Risk for fall due to : - History of fall(s) - - -  Follow up - - - - -   Functional Status Survey:    Vitals:   03/16/20 1453  BP: (!)  100/50  Pulse: 72  Resp: 16  Temp: 97.7 F (36.5 C)  SpO2: 97%  Weight: 150 lb 3.2 oz (68.1 kg)  Height: 5\' 3"  (1.6 m)   Body mass index is 26.61 kg/m. Physical Exam Vitals reviewed.  Constitutional:      General: She is not in acute distress.    Appearance: She is overweight. She is not ill-appearing.  Eyes:  General: No scleral icterus.       Right eye: No discharge.        Left eye: No discharge.     Conjunctiva/sclera: Conjunctivae normal.     Pupils: Pupils are equal, round, and reactive to light.  Cardiovascular:     Rate and Rhythm: Normal rate and regular rhythm.     Pulses: Normal pulses.     Heart sounds: Normal heart sounds. No murmur heard. No friction rub. No gallop.   Pulmonary:     Effort: Pulmonary effort is normal. No respiratory distress.     Breath sounds: Normal breath sounds. No wheezing, rhonchi or rales.  Abdominal:     General: Bowel sounds are normal. There is no distension.     Palpations: Abdomen is soft. There is no mass.     Tenderness: There is no abdominal tenderness. There is no right CVA tenderness, left CVA tenderness, guarding or rebound.  Musculoskeletal:        General: No swelling or tenderness.     Right lower leg: No edema.     Left lower leg: No edema.     Comments: Unsteady gait walks with a cane   Neurological:     Mental Status: She is alert. Mental status is at baseline.     Cranial Nerves: No cranial nerve deficit.     Motor: No weakness.     Gait: Gait abnormal.  Psychiatric:        Mood and Affect: Mood normal.        Behavior: Behavior normal.        Thought Content: Thought content normal.        Judgment: Judgment normal.    Labs reviewed: Recent Labs    05/20/19 1646  NA 139  K 4.3  CL 103  CO2 26  GLUCOSE 110*  BUN 17  CREATININE 0.55  CALCIUM 9.5   Recent Labs    05/20/19 1646  AST 21  ALT 26  ALKPHOS 90  BILITOT 0.4  PROT 7.4  ALBUMIN 4.1   Recent Labs    05/20/19 1646  WBC 6.1   NEUTROABS 3.3  HGB 13.2  HCT 40.9  MCV 96.7  PLT 199   Lab Results  Component Value Date   TSH 1.35 09/04/2017   Lab Results  Component Value Date   HGBA1C 6.0 (H) 03/07/2018   Lab Results  Component Value Date   CHOL 183 09/06/2017   HDL 63 09/06/2017   LDLCALC 99 09/06/2017   LDLDIRECT 65.7 07/08/2010   TRIG 113 09/06/2017   CHOLHDL 2.9 09/06/2017    Significant Diagnostic Results in last 30 days:  Epidural Steroid injection  Result Date: 02/17/2020 Magnus Sinning, MD     02/17/2020  3:15 PM Lumbosacral Transforaminal Epidural Steroid Injection - Sub-Pedicular Approach with Fluoroscopic Guidance Patient: KARIL ARAQUE     Date of Birth: [birth year] MRN: [id number] PCP: Ariana Curry, DO     Visit Date: 02/17/2020  Universal Protocol:   Date/Time: 02/17/2020 Consent Given By: the patient Position: PRONE Additional Comments: Vital signs were monitored before and after the procedure. Patient was prepped and draped in the usual sterile fashion. The correct patient, procedure, and site was verified. Injection Procedure Details: Procedure diagnoses: Lumbar radiculopathy [M54.16]  Meds Administered: Meds ordered this encounter Medications . methylPREDNISolone acetate (DEPO-MEDROL) injection 80 mg Laterality: Left Location/Site: L4-L5 Needle:5.0 in., 22 ga.  Short bevel or Quincke spinal needle Needle Placement: Transforaminal Findings:   -  Comments: Excellent flow of contrast along the nerve, nerve root and into the epidural [name]. Procedure Details: After squaring off the end-plates to get a true AP view, the C-arm was positioned so that an oblique view of the foramen as noted above was visualized. The target area is just inferior to the "nose of the scotty dog" or sub pedicular. The soft tissues overlying this structure were infiltrated with 2-3 ml. of 1% Lidocaine without Epinephrine. The spinal needle was inserted toward the target using a "trajectory" view along the fluoroscope beam.   Under AP and lateral visualization, the needle was advanced so it did not puncture dura and was located close the 6 O'Clock position of the pedical in AP tracterory. Biplanar projections were used to confirm position. Aspiration was confirmed to be negative for CSF and/or blood. A 1-2 ml. volume of Isovue-250 was injected and flow of contrast was noted at each level. Radiographs were obtained for documentation purposes. After attaining the desired flow of contrast documented above, a 0.5 to 1.0 ml test dose of 0.25% Marcaine was injected into each respective transforaminal [name].  The patient was observed for 90 seconds post injection.  After no sensory deficits were reported, and normal lower extremity motor function was noted,   the above injectate was administered so that equal amounts of the injectate were placed at each foramen (level) into the transforaminal epidural [name]. Additional Comments: The patient tolerated the procedure well Dressing: 2 x 2 sterile gauze and Band-Aid  Post-procedure details: Patient was observed during the procedure. Post-procedure instructions were reviewed. Patient left the clinic in stable condition.   XR C-ARM NO REPORT  Result Date: 02/17/2020 Please see Notes tab for imaging impression.   Assessment/Plan  Symptoms of urinary tract infection Reports increased urine frequency,dysuria and urgency and lower abdominal pain.  - POC Urinalysis Dipstick indicates dark yellow cloudy urine,moderate ketones,moderate blood,positive protein and large leukocytes but negative for nitrites. Results discussed with patient.will send urine for culture and sensitivity then will call with results in 3 days. - Encouraged to Increase water intake to 6-8 glasses daily - Advised to Notify provider's office if symptoms worsen or running any fever or chills  - continue with cranberry tablets daily  - continue on Keflex 500 mg Capsule three times daily  - Urine Culture  Family/ staff  Communication: Reviewed plan of care with patient verbalized understanding   Labs/tests ordered: Urine Culture    Next Appointment: As needed if symptoms worsen or fail to improve   Sandrea Hughs, NP

## 2020-03-16 NOTE — Patient Instructions (Signed)
- Increase water intake to 6-8 glasses daily  - Urine specimen send to lab for culture will call you in 3 days with results  - Notify provider's office if symptoms worsen or running any fever or chills  - continue with cranberry tablets daily    Urinary Tract Infection, Adult A urinary tract infection (UTI) is an infection of any part of the urinary tract. The urinary tract includes:  The kidneys.  The ureters.  The bladder.  The urethra. These organs make, store, and get rid of pee (urine) in the body. What are the causes? This infection is caused by germs (bacteria) in your genital area. These germs grow and cause swelling (inflammation) of your urinary tract. What increases the risk? The following factors may make you more likely to develop this condition:  Using a small, thin tube (catheter) to drain pee.  Not being able to control when you pee or poop (incontinence).  Being female. If you are female, these things can increase the risk: ? Using these methods to prevent pregnancy:  A medicine that kills sperm (spermicide).  A device that blocks sperm (diaphragm). ? Having low levels of a female hormone (estrogen). ? Being pregnant. You are more likely to develop this condition if:  You have genes that add to your risk.  You are sexually active.  You take antibiotic medicines.  You have trouble peeing because of: ? A prostate that is bigger than normal, if you are female. ? A blockage in the part of your body that drains pee from the bladder. ? A kidney stone. ? A nerve condition that affects your bladder. ? Not getting enough to drink. ? Not peeing often enough.  You have other conditions, such as: ? Diabetes. ? A weak disease-fighting system (immune system). ? Sickle cell disease. ? Gout. ? Injury of the spine. What are the signs or symptoms? Symptoms of this condition include:  Needing to pee right away.  Peeing small amounts often.  Pain or burning  when peeing.  Blood in the pee.  Pee that smells bad or not like normal.  Trouble peeing.  Pee that is cloudy.  Fluid coming from the vagina, if you are female.  Pain in the belly or lower back. Other symptoms include:  Vomiting.  Not feeling hungry.  Feeling mixed up (confused). This may be the first symptom in older adults.  Being tired and grouchy (irritable).  A fever.  Watery poop (diarrhea). How is this treated?  Taking antibiotic medicine.  Taking other medicines.  Drinking enough water. In some cases, you may need to see a specialist. Follow these instructions at home: Medicines  Take over-the-counter and prescription medicines only as told by your doctor.  If you were prescribed an antibiotic medicine, take it as told by your doctor. Do not stop taking it even if you start to feel better. General instructions  Make sure you: ? Pee until your bladder is empty. ? Do not hold pee for a long time. ? Empty your bladder after sex. ? Wipe from front to back after peeing or pooping if you are a female. Use each tissue one time when you wipe.  Drink enough fluid to keep your pee pale yellow.  Keep all follow-up visits.   Contact a doctor if:  You do not get better after 1-2 days.  Your symptoms go away and then come back. Get help right away if:  You have very bad back pain.  You have  very bad pain in your lower belly.  You have a fever.  You have chills.  You feeling like you will vomit or you vomit. Summary  A urinary tract infection (UTI) is an infection of any part of the urinary tract.  This condition is caused by germs in your genital area.  There are many risk factors for a UTI.  Treatment includes antibiotic medicines.  Drink enough fluid to keep your pee pale yellow. This information is not intended to replace advice given to you by your health care provider. Make sure you discuss any questions you have with your health care  provider. Document Revised: 09/12/2019 Document Reviewed: 09/12/2019 Elsevier Patient Education  [place].

## 2020-03-18 ENCOUNTER — Ambulatory Visit: Payer: Medicare Other | Admitting: Rheumatology

## 2020-03-18 DIAGNOSIS — Z8679 Personal history of other diseases of the circulatory system: Secondary | ICD-10-CM

## 2020-03-18 DIAGNOSIS — M19071 Primary osteoarthritis, right ankle and foot: Secondary | ICD-10-CM

## 2020-03-18 DIAGNOSIS — Z87898 Personal history of other specified conditions: Secondary | ICD-10-CM

## 2020-03-18 DIAGNOSIS — Z8639 Personal history of other endocrine, nutritional and metabolic disease: Secondary | ICD-10-CM

## 2020-03-18 DIAGNOSIS — M3509 Sicca syndrome with other organ involvement: Secondary | ICD-10-CM

## 2020-03-18 DIAGNOSIS — M19042 Primary osteoarthritis, left hand: Secondary | ICD-10-CM

## 2020-03-18 DIAGNOSIS — M5136 Other intervertebral disc degeneration, lumbar region: Secondary | ICD-10-CM

## 2020-03-18 DIAGNOSIS — Z8673 Personal history of transient ischemic attack (TIA), and cerebral infarction without residual deficits: Secondary | ICD-10-CM

## 2020-03-18 DIAGNOSIS — M8589 Other specified disorders of bone density and structure, multiple sites: Secondary | ICD-10-CM

## 2020-03-18 DIAGNOSIS — Z8719 Personal history of other diseases of the digestive system: Secondary | ICD-10-CM

## 2020-03-18 DIAGNOSIS — M7061 Trochanteric bursitis, right hip: Secondary | ICD-10-CM

## 2020-03-18 DIAGNOSIS — G609 Hereditary and idiopathic neuropathy, unspecified: Secondary | ICD-10-CM

## 2020-03-18 DIAGNOSIS — Z79899 Other long term (current) drug therapy: Secondary | ICD-10-CM

## 2020-03-18 LAB — URINE CULTURE
MICRO NUMBER:: 11482674
SPECIMEN QUALITY:: ADEQUATE

## 2020-03-19 ENCOUNTER — Other Ambulatory Visit: Payer: Self-pay

## 2020-03-19 DIAGNOSIS — N3001 Acute cystitis with hematuria: Secondary | ICD-10-CM

## 2020-03-19 MED ORDER — CIPROFLOXACIN HCL 500 MG PO TABS
500.0000 mg | ORAL_TABLET | Freq: Two times a day (BID) | ORAL | 0 refills | Status: DC
Start: 2020-03-19 — End: 2020-04-01

## 2020-03-19 NOTE — Progress Notes (Signed)
Medication "Cipro 500 mg" has warnings. Medication pend and sent to PCP Gayland Curry, DO for approval. Please Advise.

## 2020-03-19 NOTE — Progress Notes (Deleted)
Office Visit Note  Patient: Ariana [name]             Date of Birth: 1930-01-01           MRN: [id number]             PCP: Gayland Curry, DO Referring: Gayland Curry, DO Visit Date: 03/23/2020 Occupation: @GUAROCC @  Subjective:  No chief complaint on file.   History of Present Illness: Ariana [name] is a 87 y.o. female ***   Activities of Daily Living:  Patient reports morning stiffness for *** {minute/hour:19697}.   Patient {ACTIONS;DENIES/REPORTS:21021675::"Denies"} nocturnal pain.  Difficulty dressing/grooming: {ACTIONS;DENIES/REPORTS:21021675::"Denies"} Difficulty climbing stairs: {ACTIONS;DENIES/REPORTS:21021675::"Denies"} Difficulty getting out of chair: {ACTIONS;DENIES/REPORTS:21021675::"Denies"} Difficulty using hands for taps, buttons, cutlery, and/or writing: {ACTIONS;DENIES/REPORTS:21021675::"Denies"}  No Rheumatology ROS completed.   PMFS History:  Patient Active Problem List   Diagnosis Date Noted  . Diabetic neuropathy ([place]) 01/28/2019  . Diabetes mellitus due to underlying condition, uncontrolled ([place]) 09/04/2017  . History of rectal bleeding 09/04/2017  . A-fib ([place]) 04/23/2017  . Diabetes mellitus type 2 in nonobese ([place]) 04/23/2017  . Sjogren's disease ([place]) 04/03/2016  . Primary osteoarthritis of both hands 04/03/2016  . High risk medication use 04/03/2016  . Senile dementia, with behavioral disturbance ([place]) 08/16/2015  . Swelling 08/16/2015  . Diastolic dysfunction 78/29/5621  . Hypertonicity, bladder 07/08/2015  . Arterial hypotension   . Bradycardia 06/29/2015  . Chronic lower back pain 06/29/2015  . PAF (paroxysmal atrial fibrillation) ([place]) 04/18/2015  . Dehydration 04/18/2015  . Dementia without behavioral disturbance ([place]) 04/18/2015  . TIA (transient ischemic attack) 12/17/2014  . Chest pain 12/16/2014  . Acute encephalopathy 12/04/2014  . Fall   . AKI (acute kidney injury) ([place]) 12/03/2014  . History of cerebrovascular disease  09/24/2014  . Falls frequently 07/28/2014  . Infarction of parietal lobe ([place])   . Mild dementia ([place])   . CVA (cerebral infarction) 07/26/2014  . Atrial fibrillation with RVR ([place]) 07/03/2014  . Constipation 04/15/2014  . Mixed stress and urge urinary incontinence 11/03/2013  . Therapeutic opioid induced constipation 11/03/2013  . Hemorrhoid 11/03/2013  . Low back pain associated with a spinal disorder other than radiculopathy or spinal stenosis 11/03/2013  . Protein-calorie malnutrition, severe ([place]) 07/22/2013  . UTI (lower urinary tract infection) 07/20/2013  . Prolonged QT interval 07/20/2013  . Osteopenia 07/17/2013  . Palpitations 06/30/2013  . Hereditary and idiopathic peripheral neuropathy 05/22/2013  . Abnormality of gait 12/05/2012  . Headache(784.0) 09/05/2012  . Multinodular thyroid 01/15/2012  . Neck pain 01/15/2012  . Hypercholesterolemia 07/08/2010  . Tear film insufficiency 07/06/2009  . [place] SYNDROME 07/06/2009  . Urge urinary incontinence 01/06/2009  . COLONIC POLYPS, ADENOMATOUS, HX OF 04/15/2008  . MITRAL VALVE PROLAPSE 11/05/2007  . ANXIETY DEPRESSION 07/02/2007  . Mononeuritis 07/02/2007  . HYPERTENSION, BENIGN 07/02/2007  . GERD 07/02/2007  . Irritable bowel syndrome 07/02/2007  . Fibromyalgia 07/02/2007    Past Medical History:  Diagnosis Date  . Abnormality of gait   . Adenomatous polyp of colon 2002   56mm  . Allergic rhinitis   . Anxiety   . Anxiety and depression   . Chronic back pain   . Dementia without behavioral disturbance ([place])   . Depression   . Diabetes ([place])   . Diverticulosis of colon   . Dry eye syndrome   . Dysphagia   . Dysthymic disorder   . Fall   . Fibromyalgia   . GERD (gastroesophageal reflux disease)   .  H/O hiatal hernia   . History of adverse drug reaction   . History of cerebrovascular disease 09/24/2014  . History of recurrent UTIs   . Hypertension, benign   . Irritable bowel syndrome   . Low back pain  syndrome   . Memory loss   . Mitral valve prolapse   . Paroxysmal A-fib ([place])   . Peripheral neuropathy    "both feet and legs"  . Physical deconditioning   . Sjogren's syndrome ([place])   . Therapeutic opioid-induced constipation (OIC)   . Thyroid nodule   . Urinary incontinence     Family History  Problem Relation Age of Onset  . Heart disease Father        heart attack  . Pneumonia Father   . Heart attack Mother   . Hypertension Mother   . Colon cancer Sister   . Kidney disease Daughter   . Asthma Daughter   . Arthritis Daughter 2       osteo,  . Heart disease Son 61       stage 3 CHF(Diastolic /Systolic)  . Throat cancer Brother   . Hypertension Maternal Grandmother    Past Surgical History:  Procedure Laterality Date  . ABDOMINAL HYSTERECTOMY  1967  . APPENDECTOMY    . CARDIAC CATHETERIZATION  02/17/2003   normal L main, LAD free of disease, Cfx free of disease, RCA free of disease (Dr. Loni Muse. [place])  . CATARACT EXTRACTION, BILATERAL    . CHOLECYSTECTOMY  2000  . COLONOSCOPY W/ BIOPSIES     multiple  . DENTAL SURGERY     multiple tooth extractions  . ESOPHAGOGASTRODUODENOSCOPY (EGD) WITH ESOPHAGEAL DILATION N/A 08/23/2012   Procedure: ESOPHAGOGASTRODUODENOSCOPY (EGD) WITH ESOPHAGEAL DILATION;  Surgeon: Milus Banister, MD;  Location: [facility];  Service: Endoscopy;  Laterality: N/A;  . NASAL SEPTUM SURGERY  1980  . NM MYOCAR PERF WALL MOTION  2003   persantine - normal static and dynamic study w/apical thinning and presvered LV function, no ischemia  . SINUS EXPLORATION     ossifiying fibroma  . TEMPOROMANDIBULAR JOINT SURGERY  1986   Dr. Terence Lux  . 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    Patient is Widowed.    Patient has 2 children.    Patient is retired.    Former smoker   Alcohol none   Exercise Walk, exercise chair 4 days a week    POA    Walks with cane      Patient drinks about 1-2 cups of hot tea daily.   Patient is right handed.               Immunization History  Administered Date(s) Administered  . Fluad Quad(high Dose 65+) 11/08/2018, 11/24/2019  . Influenza Split 12/26/2010, 01/15/2012  . Influenza Whole 12/30/2008, 11/15/2009  . Influenza, High Dose Seasonal PF 12/21/2016, 11/05/2017  . Influenza,inj,Quad PF,6+ Mos 02/04/2013, 11/03/2013, 12/05/2014, 12/13/2015  . Moderna Sars-Covid-2 Vaccination 04/17/2019, 05/15/2019  . Pneumococcal Conjugate-13 06/12/2014  . Pneumococcal Polysaccharide-23 02/15/2009, 12/05/2014  . Tdap 12/08/2013  . Zoster 01/29/2012     Objective: Vital Signs: There were no vitals taken for this visit.   Physical Exam   Musculoskeletal Exam: ***  CDAI Exam: CDAI Score: -- Patient Global: --; Provider Global: -- Swollen: --; Tender: -- Joint Exam 03/23/2020   No joint exam has  been documented for this visit   There is currently no information documented on the homunculus. Go to the Rheumatology activity and complete the homunculus joint exam.  Investigation: No additional findings.  Imaging: No results found.  Recent Labs: Lab Results  Component Value Date   WBC 6.1 05/20/2019   HGB 13.2 05/20/2019   PLT 199 05/20/2019   NA 139 05/20/2019   K 4.3 05/20/2019   CL 103 05/20/2019   CO2 26 05/20/2019   GLUCOSE 110 (H) 05/20/2019   BUN 17 05/20/2019   CREATININE 0.55 05/20/2019   BILITOT 0.4 05/20/2019   ALKPHOS 90 05/20/2019   AST 21 05/20/2019   ALT 26 05/20/2019   PROT 7.4 05/20/2019   ALBUMIN 4.1 05/20/2019   CALCIUM 9.5 05/20/2019   GFRAA >60 05/20/2019    Speciality Comments: PLQ eye exam: 10/10/2017 Normal. Dr. Prudencio Burly. Follow up in 12 months.  Procedures:  No procedures performed Allergies: Banana, Codeine, Klonopin [clonazepam], Meperidine hcl, Norflex [orphenadrine citrate], Oxycodone-acetaminophen, Propoxyphene hcl, Zoloft [sertraline  hcl], Doxycycline, Naproxen, Penicillins, Phenothiazines, Stelazine, Sulfamethoxazole-trimethoprim, Tolectin [tolmetin sodium], and Tramadol   Assessment / Plan:     Visit Diagnoses: No diagnosis found.  Orders: No orders of the defined types were placed in this encounter.  No orders of the defined types were placed in this encounter.   Face-to-face time spent with patient was *** minutes. Greater than 50% of time was spent in counseling and coordination of care.  Follow-Up Instructions: No follow-ups on file.   Earnestine Mealing, CMA  Note - This record has been created using Editor, commissioning.  Chart creation errors have been sought, but may not always  have been located. Such creation errors do not reflect on  the standard of medical care.

## 2020-03-23 ENCOUNTER — Ambulatory Visit: Payer: Medicare Other | Admitting: Rheumatology

## 2020-03-23 DIAGNOSIS — Z79899 Other long term (current) drug therapy: Secondary | ICD-10-CM

## 2020-03-23 DIAGNOSIS — Z87898 Personal history of other specified conditions: Secondary | ICD-10-CM

## 2020-03-23 DIAGNOSIS — Z8719 Personal history of other diseases of the digestive system: Secondary | ICD-10-CM

## 2020-03-23 DIAGNOSIS — M3509 Sicca syndrome with other organ involvement: Secondary | ICD-10-CM

## 2020-03-23 DIAGNOSIS — Z8679 Personal history of other diseases of the circulatory system: Secondary | ICD-10-CM

## 2020-03-23 DIAGNOSIS — M19042 Primary osteoarthritis, left hand: Secondary | ICD-10-CM

## 2020-03-23 DIAGNOSIS — M5136 Other intervertebral disc degeneration, lumbar region: Secondary | ICD-10-CM

## 2020-03-23 DIAGNOSIS — M19071 Primary osteoarthritis, right ankle and foot: Secondary | ICD-10-CM

## 2020-03-23 DIAGNOSIS — M8589 Other specified disorders of bone density and structure, multiple sites: Secondary | ICD-10-CM

## 2020-03-23 DIAGNOSIS — M7061 Trochanteric bursitis, right hip: Secondary | ICD-10-CM

## 2020-03-23 DIAGNOSIS — G609 Hereditary and idiopathic neuropathy, unspecified: Secondary | ICD-10-CM

## 2020-03-23 DIAGNOSIS — Z8639 Personal history of other endocrine, nutritional and metabolic disease: Secondary | ICD-10-CM

## 2020-03-23 DIAGNOSIS — Z8673 Personal history of transient ischemic attack (TIA), and cerebral infarction without residual deficits: Secondary | ICD-10-CM

## 2020-03-25 ENCOUNTER — Other Ambulatory Visit: Payer: Self-pay | Admitting: Internal Medicine

## 2020-04-01 ENCOUNTER — Other Ambulatory Visit: Payer: Self-pay

## 2020-04-01 ENCOUNTER — Other Ambulatory Visit: Payer: Self-pay | Admitting: Internal Medicine

## 2020-04-01 ENCOUNTER — Encounter: Payer: Self-pay | Admitting: Internal Medicine

## 2020-04-01 ENCOUNTER — Ambulatory Visit (INDEPENDENT_AMBULATORY_CARE_PROVIDER_SITE_OTHER): Payer: Medicare Other | Admitting: Internal Medicine

## 2020-04-01 VITALS — BP 122/64 | HR 78 | Temp 97.5°F | Ht 63.0 in | Wt 149.0 lb

## 2020-04-01 DIAGNOSIS — E1140 Type 2 diabetes mellitus with diabetic neuropathy, unspecified: Secondary | ICD-10-CM | POA: Diagnosis not present

## 2020-04-01 DIAGNOSIS — E119 Type 2 diabetes mellitus without complications: Secondary | ICD-10-CM

## 2020-04-01 DIAGNOSIS — F0150 Vascular dementia without behavioral disturbance: Secondary | ICD-10-CM

## 2020-04-01 DIAGNOSIS — K644 Residual hemorrhoidal skin tags: Secondary | ICD-10-CM | POA: Diagnosis not present

## 2020-04-01 DIAGNOSIS — K582 Mixed irritable bowel syndrome: Secondary | ICD-10-CM | POA: Diagnosis not present

## 2020-04-01 DIAGNOSIS — G6289 Other specified polyneuropathies: Secondary | ICD-10-CM

## 2020-04-01 DIAGNOSIS — M48062 Spinal stenosis, lumbar region with neurogenic claudication: Secondary | ICD-10-CM | POA: Diagnosis not present

## 2020-04-01 DIAGNOSIS — M545 Low back pain, unspecified: Secondary | ICD-10-CM

## 2020-04-01 MED ORDER — SENNOSIDES-DOCUSATE SODIUM 8.6-50 MG PO TABS: 1.0000 | ORAL_TABLET | Freq: Every day | ORAL | 3 refills | Status: DC

## 2020-04-01 NOTE — Patient Instructions (Signed)
Restart senna-s one tablet daily for hard stools. Use preparation H also daily for at least a week due to hemorrhoids being inflamed and bleeding.

## 2020-04-01 NOTE — Progress Notes (Signed)
Location:  [facility] clinic Provider:  Aundria Bitterman L. Mariea Clonts, D.O., C.M.D.  Code Status: DNR Goals of Care:  Advanced Directives 04/01/2020  Does Patient Have a Medical Advance Directive? Yes  Type of Advance Directive [place]  Does patient want to make changes to medical advance directive? No - Patient declined  Copy of [place] in Chart? Yes - validated most recent copy scanned in chart (See row information)  Would patient like information on creating a medical advance directive? -  Pre-existing out of facility DNR order (yellow form or pink MOST form) -     Chief Complaint  Patient presents with  . Medical Management of Chronic Issues    3 month follow up. Discuss seeing blood when having a bowel movements.   . Health Maintenance    Discuss foot exam, and eye exam (04/05/2020)    HPI: Patient is a 87 y.o. female seen today for medical management of chronic diseases.  She is accompanied by her caregiver.  Back injection helped her for a week at least.  She now c/o pain down the back of her legs and in her feet.    Has hemorrhoids that are bleeding and is wiping aggressively.  Stools remain hard often with little pellets, but does sometimes have normal bms per caregiver.  She is taking miralax about qod.    Past Medical History:  Diagnosis Date  . Abnormality of gait   . Adenomatous polyp of colon 2002   48mm  . Allergic rhinitis   . Anxiety   . Anxiety and depression   . Chronic back pain   . Dementia without behavioral disturbance ([place])   . Depression   . Diabetes ([place])   . Diverticulosis of colon   . Dry eye syndrome   . Dysphagia   . Dysthymic disorder   . Fall   . Fibromyalgia   . GERD (gastroesophageal reflux disease)   . H/O hiatal hernia   . History of adverse drug reaction   . History of cerebrovascular disease 09/24/2014  . History of recurrent UTIs   . Hypertension, benign   . Irritable bowel syndrome   . Low back pain  syndrome   . Memory loss   . Mitral valve prolapse   . Paroxysmal A-fib ([place])   . Peripheral neuropathy    "both feet and legs"  . Physical deconditioning   . Sjogren's syndrome ([place])   . Therapeutic opioid-induced constipation (OIC)   . Thyroid nodule   . Urinary incontinence     Past Surgical History:  Procedure Laterality Date  . ABDOMINAL HYSTERECTOMY  1967  . APPENDECTOMY    . CARDIAC CATHETERIZATION  02/17/2003   normal L main, LAD free of disease, Cfx free of disease, RCA free of disease (Dr. Loni Muse. [place])  . CATARACT EXTRACTION, BILATERAL    . CHOLECYSTECTOMY  2000  . COLONOSCOPY W/ BIOPSIES     multiple  . DENTAL SURGERY     multiple tooth extractions  . ESOPHAGOGASTRODUODENOSCOPY (EGD) WITH ESOPHAGEAL DILATION N/A 08/23/2012   Procedure: ESOPHAGOGASTRODUODENOSCOPY (EGD) WITH ESOPHAGEAL DILATION;  Surgeon: Milus Banister, MD;  Location: [facility];  Service: Endoscopy;  Laterality: N/A;  . NASAL SEPTUM SURGERY  1980  . NM MYOCAR PERF WALL MOTION  2003   persantine - normal static and dynamic study w/apical thinning and presvered LV function, no ischemia  . SINUS EXPLORATION     ossifiying fibroma  . [place]  Dr. Terence Lux  . TRANSTHORACIC ECHOCARDIOGRAM  2001   mild LVH, normal LV    Allergies  Allergen Reactions  . Banana Nausea And Vomiting  . Codeine Nausea Only    unless given with Phenergan  . Klonopin [Clonazepam] Other (See Comments)    Causes hallucination   . Meperidine Hcl Nausea Only    unless given with Phenergan  . Norflex [Orphenadrine Citrate] Nausea Only    Unless given with Phenergan  . Oxycodone-Acetaminophen Nausea Only    unless given with phenergan  . Propoxyphene Hcl Nausea Only    unless given with phenergan  . Zoloft [Sertraline Hcl] Other (See Comments)    Caused lethargy  . Doxycycline Other (See Comments)    Unknown reaction  . Naproxen Other (See Comments)    Unknown reaction  . Penicillins  Other (See Comments)    Unknown reaction Has patient had a PCN reaction causing immediate rash, facial/tongue/throat swelling, SOB or lightheadedness with hypotension: Unknown Has patient had a PCN reaction causing severe rash involving mucus membranes or skin necrosis: Unknown Has patient had a PCN reaction that required hospitalization: pt was in the hospital at time of reaction Has patient had a PCN reaction occurring within the last 10 years: Unknown If all of the above answers are "NO", then may proceed with Cephalos  . Phenothiazines Other (See Comments)    Unknown reaction  . Stelazine Other (See Comments)    Unknown reaction  . Sulfamethoxazole-Trimethoprim Other (See Comments)    Unknown reaction  . Tolectin [Tolmetin Sodium] Other (See Comments)    Unknown reaction  . Tramadol Other (See Comments)    Unknown reaction    Outpatient Encounter Medications as of 04/01/2020  Medication Sig  . CRANBERRY PO Take 2 tablets by mouth daily.  Marland Kitchen diltiazem (CARDIZEM CD) 180 MG 24 hr capsule TAKE 1 CAPSULE BY MOUTH ONCE DAILY  . DULoxetine (CYMBALTA) 30 MG capsule Take 1 capsule by mouth daly for back pain and depression in combination with 60 mg tablet  . DULoxetine (CYMBALTA) 60 MG capsule Take 1 capsule by mouth once daily  . furosemide (LASIX) 40 MG tablet Take 1 tablet by mouth once daily  . glucose blood (ONETOUCH VERIO) test strip Use to test blood sugar daily. Dx:E08.65  . JANUVIA 100 MG tablet Take 1 tablet by mouth once daily  . Lancets Micro Thin 33G MISC Use to test blood sugar daily. Dx: E08.65  . lansoprazole (PREVACID) 30 MG capsule TAKE 1 CAPSULE BY MOUTH ONCE DAILY AT NOON  . LINZESS 290 MCG CAPS capsule TAKE 1 CAPSULE BY MOUTH EVERY DAY AT BEDTIME  . loratadine (CLARITIN) 10 MG tablet Take 10 mg by mouth daily.  . memantine (NAMENDA) 10 MG tablet Take 1 tablet by mouth twice daily  . MYRBETRIQ 50 MG TB24 tablet Take 1 tablet by mouth once daily  . pilocarpine (SALAGEN)  5 MG tablet Take 1 tablet by mouth twice daily  . polyethylene glycol (MIRALAX / GLYCOLAX) packet Take by mouth daily as needed (constipation). Mix 1/2 capful of miralax in 8 oz liquid and drink  . potassium chloride SA (KLOR-CON) 20 MEQ tablet Take 1 tablet by mouth once daily  . pregabalin (LYRICA) 100 MG capsule Take 1 capsule by mouth twice daily  . acetaminophen (TYLENOL) 500 MG tablet Take 1,000 mg by mouth 2 (two) times daily.  Marland Kitchen ALPRAZolam (XANAX) 0.25 MG tablet Take 1 tablet by mouth twice daily as needed  . antiseptic oral rinse (  BIOTENE) LIQD 15 mLs by Mouth Rinse route 5 (five) times daily as needed for dry mouth.  Marland Kitchen aspirin 325 MG tablet Take 1 tablet (325 mg total) by mouth daily.  . Capsaicin-Menthol-Methyl Sal (CAPSAICIN-METHYL SAL-MENTHOL) 0.025-1-12 % CREA Apply 1 application topically 2 (two) times daily.  . carboxymethylcellulose (REFRESH PLUS) 0.5 % SOLN Place 1 drop into both eyes 5 (five) times daily as needed (dry eyes).  . [DISCONTINUED] cephALEXin (KEFLEX) 500 MG capsule Take 1 capsule (500 mg total) by mouth in the morning, at noon, and at bedtime.  . [DISCONTINUED] ciprofloxacin (CIPRO) 500 MG tablet Take 1 tablet (500 mg total) by mouth 2 (two) times daily.   No facility-administered encounter medications on file as of 04/01/2020.    Review of Systems:  Review of Systems  Constitutional: Negative for chills and fever.  HENT: Positive for hearing loss. Negative for congestion and sore throat.   Eyes: Negative for blurred vision.  Respiratory: Negative for cough and shortness of breath.   Cardiovascular: Negative for chest pain, palpitations and leg swelling.  Gastrointestinal: Positive for abdominal pain, blood in stool and constipation. Negative for diarrhea and melena.       Liver mass for which workup not being done due to other illnesses  Genitourinary: Negative for dysuria.       OAB  Musculoskeletal: Positive for back pain and falls.  Neurological:  Negative for dizziness and loss of consciousness.  Endo/Heme/Allergies: Bruises/bleeds easily.  Psychiatric/Behavioral: Positive for depression and memory loss. The patient does not have insomnia.     Health Maintenance  Topic Date Due  . OPHTHALMOLOGY EXAM  Never done  . HEMOGLOBIN A1C  09/05/2018  . FOOT EXAM  01/28/2020  . TETANUS/TDAP  12/09/2023  . INFLUENZA VACCINE  Completed  . DEXA SCAN  Completed  . COVID-19 Vaccine  Completed  . PNA vac Low Risk Adult  Completed  . URINE MICROALBUMIN  Discontinued    Physical Exam: Vitals:   04/01/20 1535  BP: 122/64  Pulse: 78  Temp: (!) 97.5 F (36.4 C)  SpO2: 96%  Weight: 149 lb (67.6 kg)  Height: 5\' 3"  (1.6 m)   Body mass index is 26.39 kg/m. Physical Exam Vitals reviewed.  Constitutional:      General: She is not in acute distress.    Appearance: Normal appearance. She is not toxic-appearing.  HENT:     Head: Normocephalic and atraumatic.  Eyes:     Conjunctiva/sclera: Conjunctivae normal.     Pupils: Pupils are equal, round, and reactive to light.  Cardiovascular:     Rate and Rhythm: Normal rate and regular rhythm.     Pulses: Normal pulses.     Heart sounds: Normal heart sounds.  Pulmonary:     Effort: Pulmonary effort is normal.     Breath sounds: Normal breath sounds. No wheezing, rhonchi or rales.  Abdominal:     General: Bowel sounds are normal. There is no distension.     Palpations: Abdomen is soft.     Tenderness: There is no abdominal tenderness. There is no guarding or rebound.  Musculoskeletal:        General: Normal range of motion.     Cervical back: Neck supple.     Right lower leg: No edema.     Left lower leg: No edema.  Lymphadenopathy:     Cervical: No cervical adenopathy.  Skin:    General: Skin is warm and dry.  Neurological:     General: No  focal deficit present.     Mental Status: She is alert.     Gait: Gait abnormal.     Comments: Oriented to person and place; uses cane   Psychiatric:        Mood and Affect: Mood normal.     Labs reviewed: Basic Metabolic Panel: Recent Labs    05/20/19 1646  NA 139  K 4.3  CL 103  CO2 26  GLUCOSE 110*  BUN 17  CREATININE 0.55  CALCIUM 9.5   Liver Function Tests: Recent Labs    05/20/19 1646  AST 21  ALT 26  ALKPHOS 90  BILITOT 0.4  PROT 7.4  ALBUMIN 4.1   Recent Labs    05/20/19 1646  LIPASE 40   No results for input(s): AMMONIA in the last 8760 hours. CBC: Recent Labs    05/20/19 1646  WBC 6.1  NEUTROABS 3.3  HGB 13.2  HCT 40.9  MCV 96.7  PLT 199   Lipid Panel: No results for input(s): CHOL, HDL, LDLCALC, TRIG, CHOLHDL, LDLDIRECT in the last 8760 hours. Lab Results  Component Value Date   HGBA1C 6.0 (H) 03/07/2018    Procedures since last visit: No results found.  Assessment/Plan 1. Irritable bowel syndrome with both constipation and diarrhea - cont miralax qod - senna-docusate (SENNA S) 8.6-50 MG tablet; Take 1 tablet by mouth daily.  Dispense: 30 tablet; Refill: 3  2. Bleeding external hemorrhoids -use daily preparation H until improved--should help itching that's causing her to wipe them aggressively   3. Vascular dementia without behavioral disturbance ([place]) -ongoing, her daughter has been told multiple times that she should have 24x7 care, but patient is still left alone on weekends and that's when she tends to fall  4. Spinal stenosis of lumbar region with neurogenic claudication -cont use of cane  -had injection with some improvement -f/u with Dr. Ernestina Patches as needed  5. Controlled type 2 diabetes mellitus with diabetic neuropathy, without long-term current use of insulin ([place]) - f/u labs -cont current diabetes mgt - BASIC METABOLIC PANEL WITH GFR - Hemoglobin A1c - CBC with Differential/Platelet  Labs/tests ordered:   Lab Orders  No laboratory test(s) ordered today    Next appt:  4 mos med mgt  Clarann Helvey L. Noga Fogg, D.O. [facility] [address], [place] [address] Cell Phone (Mon-Fri 8am-5pm):  [phone] On Call:  [phone] & follow prompts after 5pm & weekends Office Phone:  [phone] Office Fax:  [phone]

## 2020-04-02 LAB — CBC WITH DIFFERENTIAL/PLATELET
Absolute Monocytes: 774 cells/uL (ref 200–950)
Basophils Absolute: 53 cells/uL (ref 0–200)
Basophils Relative: 0.6 %
Eosinophils Absolute: 123 cells/uL (ref 15–500)
Eosinophils Relative: 1.4 %
HCT: 39.9 % (ref 35.0–45.0)
Hemoglobin: 13.3 g/dL (ref 11.7–15.5)
Lymphs Abs: 1971 cells/uL (ref 850–3900)
MCH: 31.2 pg (ref 27.0–33.0)
MCHC: 33.3 g/dL (ref 32.0–36.0)
MCV: 93.7 fL (ref 80.0–100.0)
MPV: 10.9 fL (ref 7.5–12.5)
Monocytes Relative: 8.8 %
Neutro Abs: 5878 cells/uL (ref 1500–7800)
Neutrophils Relative %: 66.8 %
Platelets: 242 10*3/uL (ref 140–400)
RBC: 4.26 10*6/uL (ref 3.80–5.10)
RDW: 12.4 % (ref 11.0–15.0)
Total Lymphocyte: 22.4 %
WBC: 8.8 10*3/uL (ref 3.8–10.8)

## 2020-04-02 LAB — BASIC METABOLIC PANEL WITH GFR
BUN/Creatinine Ratio: 27 (calc) — ABNORMAL HIGH (ref 6–22)
BUN: 25 mg/dL (ref 7–25)
CO2: 32 mmol/L (ref 20–32)
Calcium: 10.7 mg/dL — ABNORMAL HIGH (ref 8.6–10.4)
Chloride: 102 mmol/L (ref 98–110)
Creat: 0.92 mg/dL — ABNORMAL HIGH (ref 0.60–0.88)
GFR, Est African American: 64 mL/min/{1.73_m2} (ref >=60)
GFR, Est Non African American: 56 mL/min/{1.73_m2} — ABNORMAL LOW (ref >=60)
Glucose, Bld: 104 mg/dL — ABNORMAL HIGH (ref 65–99)
Potassium: 5.5 mmol/L — ABNORMAL HIGH (ref 3.5–5.3)
Sodium: 143 mmol/L (ref 135–146)

## 2020-04-02 LAB — HEMOGLOBIN A1C
Hgb A1c MFr Bld: 6.8 % of total Hgb — ABNORMAL HIGH (ref <5.7)
Mean Plasma Glucose: 148 mg/dL
eAG (mmol/L): 8.2 mmol/L

## 2020-04-02 NOTE — Progress Notes (Signed)
Please notify her daughter: Blood counts are normal--no anemia. Kidney function has declined a little bit and two of her electrolytes are a little high--I recommend she drink more water to help with this and to keep her bowels softer. Sugar average trended up from before but is still satisfactory for her age and other medical conditions

## 2020-04-02 NOTE — Telephone Encounter (Signed)
Treatment agreement never returned when sent in past.  Pt with dementia comes to visits with caregiver not family.

## 2020-04-02 NOTE — Telephone Encounter (Signed)
Alprazolam last filled on 10/01/2019, #60 with 5 refills  No treatment agreement on file, patient was seen yesterday by Hollace Kinnier L, DO.  No pending appointment scheduled

## 2020-04-05 ENCOUNTER — Encounter: Payer: Self-pay | Admitting: Internal Medicine

## 2020-04-12 ENCOUNTER — Other Ambulatory Visit: Payer: Self-pay | Admitting: Internal Medicine

## 2020-04-14 ENCOUNTER — Ambulatory Visit: Payer: Medicare Other | Admitting: Nurse Practitioner

## 2020-04-16 ENCOUNTER — Ambulatory Visit: Payer: Self-pay | Admitting: Family

## 2020-04-16 ENCOUNTER — Other Ambulatory Visit: Payer: Self-pay

## 2020-04-16 ENCOUNTER — Ambulatory Visit (HOSPITAL_COMMUNITY)
Admission: EM | Admit: 2020-04-16 | Discharge: 2020-04-16 | Disposition: A | Discharge status: Home / Self Care | Payer: Medicare Other | Attending: Internal Medicine | Admitting: Internal Medicine

## 2020-04-16 ENCOUNTER — Encounter (HOSPITAL_COMMUNITY): Payer: Self-pay

## 2020-04-16 ENCOUNTER — Ambulatory Visit (INDEPENDENT_AMBULATORY_CARE_PROVIDER_SITE_OTHER): Payer: Medicare Other

## 2020-04-16 DIAGNOSIS — M25572 Pain in left ankle and joints of left foot: Secondary | ICD-10-CM | POA: Diagnosis not present

## 2020-04-16 DIAGNOSIS — M7989 Other specified soft tissue disorders: Secondary | ICD-10-CM | POA: Diagnosis not present

## 2020-04-16 DIAGNOSIS — M25552 Pain in left hip: Secondary | ICD-10-CM | POA: Diagnosis not present

## 2020-04-16 DIAGNOSIS — W19XXXA Unspecified fall, initial encounter: Secondary | ICD-10-CM | POA: Diagnosis not present

## 2020-04-16 DIAGNOSIS — S8265XA Nondisplaced fracture of lateral malleolus of left fibula, initial encounter for closed fracture: Secondary | ICD-10-CM

## 2020-04-16 NOTE — ED Provider Notes (Signed)
[place]    CSN: [id number] Arrival date & time: 04/16/20  1139      History   Chief Complaint Chief Complaint  Patient presents with  . Fall  . Ankle Injury    HPI Ariana [name] is a 87 y.o. female comes to urgent care with left ankle pain which started last night.  Patient fell in the bathtub and developed pain in the left ankle.  Pain is sharp, aggravated by palpation and bear weight.  It is not relieved with Tylenol use.  Patient has bruising around the left ankle.  Is associated with swelling.  Patient denies hitting her head or losing consciousness.  She also complains of left hip pain with the same characteristics as the left ankle pain.  Patient has a history of falls.   HPI  Past Medical History:  Diagnosis Date  . Abnormality of gait   . Adenomatous polyp of colon 2002   45mm  . Allergic rhinitis   . Anxiety   . Anxiety and depression   . Chronic back pain   . Dementia without behavioral disturbance ([place])   . Depression   . Diabetes ([place])   . Diverticulosis of colon   . Dry eye syndrome   . Dysphagia   . Dysthymic disorder   . Fall   . Fibromyalgia   . GERD (gastroesophageal reflux disease)   . H/O hiatal hernia   . History of adverse drug reaction   . History of cerebrovascular disease 09/24/2014  . History of recurrent UTIs   . Hypertension, benign   . Irritable bowel syndrome   . Low back pain syndrome   . Memory loss   . Mitral valve prolapse   . Paroxysmal A-fib ([place])   . Peripheral neuropathy    "both feet and legs"  . Physical deconditioning   . Sjogren's syndrome ([place])   . Therapeutic opioid-induced constipation (OIC)   . Thyroid nodule   . Urinary incontinence     Patient Active Problem List   Diagnosis Date Noted  . Diabetic neuropathy ([place]) 01/28/2019  . Diabetes mellitus due to underlying condition, uncontrolled ([place]) 09/04/2017  . History of rectal bleeding 09/04/2017  . A-fib ([place]) 04/23/2017  . Diabetes mellitus  type 2 in nonobese ([place]) 04/23/2017  . Sjogren's disease ([place]) 04/03/2016  . Primary osteoarthritis of both hands 04/03/2016  . High risk medication use 04/03/2016  . Senile dementia, with behavioral disturbance ([place]) 08/16/2015  . Swelling 08/16/2015  . Diastolic dysfunction 34/19/6222  . Hypertonicity, bladder 07/08/2015  . Arterial hypotension   . Bradycardia 06/29/2015  . Chronic lower back pain 06/29/2015  . PAF (paroxysmal atrial fibrillation) ([place]) 04/18/2015  . Dehydration 04/18/2015  . Dementia without behavioral disturbance ([place]) 04/18/2015  . TIA (transient ischemic attack) 12/17/2014  . Chest pain 12/16/2014  . Acute encephalopathy 12/04/2014  . Fall   . AKI (acute kidney injury) ([place]) 12/03/2014  . History of cerebrovascular disease 09/24/2014  . Falls frequently 07/28/2014  . Infarction of parietal lobe ([place])   . Mild dementia ([place])   . CVA (cerebral infarction) 07/26/2014  . Atrial fibrillation with RVR ([place]) 07/03/2014  . Constipation 04/15/2014  . Mixed stress and urge urinary incontinence 11/03/2013  . Therapeutic opioid induced constipation 11/03/2013  . Hemorrhoid 11/03/2013  . Low back pain associated with a spinal disorder other than radiculopathy or spinal stenosis 11/03/2013  . Protein-calorie malnutrition, severe ([place]) 07/22/2013  . UTI (lower urinary tract infection) 07/20/2013  .  Prolonged QT interval 07/20/2013  . Osteopenia 07/17/2013  . Palpitations 06/30/2013  . Hereditary and idiopathic peripheral neuropathy 05/22/2013  . Abnormality of gait 12/05/2012  . Headache(784.0) 09/05/2012  . Multinodular thyroid 01/15/2012  . Neck pain 01/15/2012  . Hypercholesterolemia 07/08/2010  . Tear film insufficiency 07/06/2009  . [place] SYNDROME 07/06/2009  . Urge urinary incontinence 01/06/2009  . COLONIC POLYPS, ADENOMATOUS, HX OF 04/15/2008  . MITRAL VALVE PROLAPSE 11/05/2007  . ANXIETY DEPRESSION 07/02/2007  . Mononeuritis 07/02/2007  .  HYPERTENSION, BENIGN 07/02/2007  . GERD 07/02/2007  . Irritable bowel syndrome 07/02/2007  . Fibromyalgia 07/02/2007    Past Surgical History:  Procedure Laterality Date  . ABDOMINAL HYSTERECTOMY  1967  . APPENDECTOMY    . CARDIAC CATHETERIZATION  02/17/2003   normal L main, LAD free of disease, Cfx free of disease, RCA free of disease (Dr. Loni Muse. [facility])  . CATARACT EXTRACTION, BILATERAL    . CHOLECYSTECTOMY  2000  . COLONOSCOPY W/ BIOPSIES     multiple  . DENTAL SURGERY     multiple tooth extractions  . ESOPHAGOGASTRODUODENOSCOPY (EGD) WITH ESOPHAGEAL DILATION N/A 08/23/2012   Procedure: ESOPHAGOGASTRODUODENOSCOPY (EGD) WITH ESOPHAGEAL DILATION;  Surgeon: Milus Banister, MD;  Location: [facility];  Service: Endoscopy;  Laterality: N/A;  . NASAL SEPTUM SURGERY  1980  . NM MYOCAR PERF WALL MOTION  2003   persantine - normal static and dynamic study w/apical thinning and presvered LV function, no ischemia  . SINUS EXPLORATION     ossifiying fibroma  . TEMPOROMANDIBULAR JOINT SURGERY  1986   Dr. Terence Lux  . TRANSTHORACIC ECHOCARDIOGRAM  2001   mild LVH, normal LV    OB History   No obstetric history on file.      Home Medications    Prior to Admission medications   Medication Sig Start Date End Date Taking? Authorizing Provider  acetaminophen (TYLENOL) 500 MG tablet Take 1,000 mg by mouth 2 (two) times daily.    [provider]  ALPRAZolam Duanne Moron) 0.25 MG tablet Take 1 tablet by mouth twice daily as needed 04/02/20   Mariea Clonts, Tiffany L, DO  antiseptic oral rinse (BIOTENE) LIQD 15 mLs by Mouth Rinse route 5 (five) times daily as needed for dry mouth.    [provider]  aspirin 325 MG tablet Take 1 tablet (325 mg total) by mouth daily. 07/28/14   Reyne Dumas, MD  Capsaicin-Menthol-Methyl Sal (CAPSAICIN-METHYL SAL-MENTHOL) 0.025-1-12 % CREA Apply 1 application topically 2 (two) times daily. 09/11/18   Ngetich, Dinah C, NP  carboxymethylcellulose (REFRESH PLUS) 0.5  % SOLN Place 1 drop into both eyes 5 (five) times daily as needed (dry eyes).    [provider]  CRANBERRY PO Take 2 tablets by mouth daily.    [provider]  diltiazem (CARDIZEM CD) 180 MG 24 hr capsule TAKE 1 CAPSULE BY MOUTH ONCE DAILY 06/09/19   Hilty, Nadean Corwin, MD  DULoxetine (CYMBALTA) 30 MG capsule Take 1 capsule by mouth daly for back pain and depression in combination with 60 mg tablet 01/29/20   Reed, Tiffany L, DO  DULoxetine (CYMBALTA) 60 MG capsule Take 1 capsule by mouth once daily 01/29/20   Reed, Tiffany L, DO  furosemide (LASIX) 40 MG tablet Take 1 tablet by mouth once daily 02/10/20   Reed, Tiffany L, DO  glucose blood (ONETOUCH VERIO) test strip Use to test blood sugar daily. Dx:E08.65 10/29/19   Reed, Tiffany L, DO  JANUVIA 100 MG tablet Take 1 tablet by mouth  once daily 04/02/20   Reed, Tiffany L, DO  Lancets Micro Thin 33G MISC Use to test blood sugar daily. Dx: E08.65 10/29/19   Reed, Tiffany L, DO  lansoprazole (PREVACID) 30 MG capsule TAKE 1 CAPSULE BY MOUTH ONCE DAILY AT NOON 03/15/20   Reed, Tiffany L, DO  LINZESS 290 MCG CAPS capsule TAKE 1 CAPSULE BY MOUTH EVERY DAY AT BEDTIME 03/25/20   Gatha Mayer, MD  loratadine (CLARITIN) 10 MG tablet Take 10 mg by mouth daily.    [provider]  memantine (NAMENDA) 10 MG tablet Take 1 tablet by mouth twice daily 02/26/20   Mariea Clonts, Tiffany L, DO  MYRBETRIQ 50 MG TB24 tablet Take 1 tablet by mouth once daily 04/12/20   Mariea Clonts, Tiffany L, DO  pilocarpine (SALAGEN) 5 MG tablet Take 1 tablet by mouth twice daily 02/02/20   Bo Merino, MD  polyethylene glycol (MIRALAX / GLYCOLAX) packet Take by mouth daily as needed (constipation). Mix 1/2 capful of miralax in 8 oz liquid and drink    [provider]  potassium chloride SA (KLOR-CON) 20 MEQ tablet Take 1 tablet by mouth once daily 01/29/20   Reed, Tiffany L, DO  pregabalin (LYRICA) 100 MG capsule Take 1 capsule by mouth twice daily 04/02/20   Reed,  Tiffany L, DO  senna-docusate (SENNA S) 8.6-50 MG tablet Take 1 tablet by mouth daily. 04/01/20   Gayland Curry, DO    Family History Family History  Problem Relation Age of Onset  . Heart disease Father        heart attack  . Pneumonia Father   . Heart attack Mother   . Hypertension Mother   . Colon cancer Sister   . Kidney disease Daughter   . Asthma Daughter   . Arthritis Daughter 17       osteo,  . Heart disease Son 10       stage 3 CHF(Diastolic /Systolic)  . Throat cancer Brother   . Hypertension Maternal Grandmother     Social History Social History   Tobacco Use  . Smoking status: Former Research scientist (life sciences)  . Smokeless tobacco: Never Used  . Tobacco comment: Quit at age 20   Vaping Use  . Vaping Use: Never used  Substance Use Topics  . Alcohol use: No  . Drug use: Never     Allergies   Banana, Codeine, Klonopin [clonazepam], Meperidine hcl, Norflex [orphenadrine citrate], Oxycodone-acetaminophen, Propoxyphene hcl, Zoloft [sertraline hcl], Doxycycline, Naproxen, Penicillins, Phenothiazines, Stelazine, Sulfamethoxazole-trimethoprim, Tolectin [tolmetin sodium], and Tramadol   Review of Systems Review of Systems  Gastrointestinal: Negative.   Genitourinary: Negative.   Musculoskeletal: Positive for arthralgias. Negative for myalgias.  Skin: Negative.   Neurological: Negative.      Physical Exam Triage Vital Signs ED Triage Vitals  Enc Vitals Group     BP 04/16/20 1159 (!) 116/52     Pulse Rate 04/16/20 1159 80     Resp 04/16/20 1159 19     Temp 04/16/20 1159 (!) 97.5 F (36.4 C)     Temp Source 04/16/20 1159 Oral     SpO2 04/16/20 1159 96 %     Weight --      Height --      Head Circumference --      Peak Flow --      Pain Score 04/16/20 1156 10     Pain Loc --      Pain Edu? --      Excl. in [place]? --  No data found.  Updated Vital Signs BP (!) 116/52 (BP Location: Right Arm)   Pulse 80   Temp (!) 97.5 F (36.4 C) (Oral)   Resp 19   SpO2 96%    Visual Acuity Right Eye Distance:   Left Eye Distance:   Bilateral Distance:    Right Eye Near:   Left Eye Near:    Bilateral Near:     Physical Exam Vitals and nursing note reviewed.  Constitutional:      General: She is not in acute distress.    Appearance: She is not ill-appearing.  Cardiovascular:     Rate and Rhythm: Normal rate and regular rhythm.     Pulses: Normal pulses.     Heart sounds: Normal heart sounds.  Pulmonary:     Effort: Pulmonary effort is normal.     Breath sounds: Normal breath sounds.  Musculoskeletal:     Comments: Left ankle swelling with bruising.  Painful range of motion exercises.  Tenderness on palpation over the distal lateral malleolus of the left ankle.  Neurological:     Mental Status: She is alert.      UC Treatments / Results  Labs (all labs ordered are listed, but only abnormal results are displayed) Labs Reviewed - No data to display  EKG   Radiology DG Ankle Complete Left  Result Date: 04/16/2020 CLINICAL DATA:  Left ankle pain after a fall, initial encounter. EXAM: LEFT ANKLE COMPLETE - 3+ VIEW COMPARISON:  None. FINDINGS: Nondisplaced distal fibular fracture with overlying soft tissue swelling. Ankle mortise is intact. No additional evidence of an acute fracture. There is a joint effusion in association. IMPRESSION: Nondisplaced distal fibular fracture with a joint effusion and overlying soft tissue swelling. Electronically Signed   By: Lorin Picket M.D.   On: 04/16/2020 13:04   DG Hip Unilat With Pelvis 2-3 Views Left  Result Date: 04/16/2020 CLINICAL DATA:  Fall with left hip pain, initial encounter. EXAM: DG HIP (WITH OR WITHOUT PELVIS) 2-3V LEFT COMPARISON:  None. FINDINGS: No fracture or dislocation. Mild superior joint space narrowing in both hips. IMPRESSION: 1. No fracture or dislocation. 2. Mild superior joint space narrowing in the hips bilaterally. Electronically Signed   By: Lorin Picket M.D.   On: 04/16/2020  13:05    Procedures Procedures (including critical care time)  Medications Ordered in UC Medications - No data to display  Initial Impression / Assessment and Plan / UC Course  I have reviewed the triage vital signs and the nursing notes.  Pertinent labs & imaging results that were available during my care of the patient were reviewed by me and considered in my medical decision making (see chart for details).     1.  Closed nondisplaced fracture of the lateral malleolus of the left fibula: X-ray of the left ankle is significant for closed nondisplaced fracture of the distal lateral malleolus of the left fibula Posterior splint Orthopedic surgery follow-up Continue Tylenol use Nonweightbearing Patient is advised to use the walker or cane. Return precautions given. Final Clinical Impressions(s) / UC Diagnoses   Final diagnoses:  Fall  Closed nondisplaced fracture of lateral malleolus of left fibula, initial encounter     Discharge Instructions     Take medications as directed Nonweightbearing Please use a cane in getting around Follow-up with the orthopedic surgeon in the office.   ED Prescriptions    None     PDMP not reviewed this encounter.   Chase Picket, MD 04/16/20  1647  

## 2020-04-16 NOTE — Progress Notes (Signed)
Orthopedic Tech Progress Note Patient Details:  Ariana [name] 1929-08-20 [id number]  Ortho Devices Type of Ortho Device: Short leg splint Ortho Device/Splint Location: LLE Ortho Device/Splint Interventions: Ordered,Adjustment,Application   Post Interventions Patient Tolerated: Well Instructions Provided: Care of device   Janit Pagan 04/16/2020, 1:39 PM

## 2020-04-16 NOTE — Discharge Instructions (Addendum)
Take medications as directed Nonweightbearing Please use a cane in getting around Follow-up with the orthopedic surgeon in the office.

## 2020-04-16 NOTE — ED Triage Notes (Signed)
Pt reports she fell in the bathroom and injured the right ankle and right foot. Pt reports pain and swelling in the right ankle.

## 2020-04-20 ENCOUNTER — Ambulatory Visit (INDEPENDENT_AMBULATORY_CARE_PROVIDER_SITE_OTHER): Payer: Medicare Other | Admitting: Physician Assistant

## 2020-04-20 ENCOUNTER — Encounter: Payer: Self-pay | Admitting: Orthopedic Surgery

## 2020-04-20 DIAGNOSIS — M25572 Pain in left ankle and joints of left foot: Secondary | ICD-10-CM | POA: Diagnosis not present

## 2020-04-20 MED ORDER — HYDROCODONE-ACETAMINOPHEN 5-325 MG PO TABS: 1.0000 | ORAL_TABLET | Freq: Three times a day (TID) | ORAL | 0 refills | Status: DC | PRN

## 2020-04-20 NOTE — Progress Notes (Signed)
Office Visit Note   Patient: Ariana [name]           Date of Birth: 02-19-1929           MRN: [id number] Visit Date: 04/20/2020              Requested by: Gayland Curry, DO [place],  [place] [address] PCP: Gayland Curry, DO  Chief Complaint  Patient presents with  . Left Ankle - Pain    S/p fall 04/16/20      HPI: This is a pleasant 87 year old woman who is 4 days status post falling.  She was evaluated in urgent care and was found to have sustained a left ankle fracture.  She was placed in a short leg splint.  She is accompanied by her aide who works with her full-time.  She does say that she is putting full weight on it in the splint despite having a wheelchair and walker at home.  Patient is complaining of pain and was wondering if something stronger than Tylenol could be prescribed Assessment & Plan: Visit Diagnoses: No diagnosis found.  Plan: Left Weber a minimally displaced distal fibula fracture.  I discussed with her and her aide I would recommend going into a compression sock which her aide says she has at home.  We will place her in a cam walker boot she does not have to wear this at night.  She needs to be elevating her leg to help decrease the swelling.  Try to refrain from weightbearing as much as possible to help with swelling.  We will give her a small prescription for hydrocodone 1 every 8 hours as needed for pain.  We will follow-up in 2 weeks for repeat x-rays  Follow-Up Instructions: No follow-ups on file.   Ortho Exam  Patient is alert, oriented, no adenopathy, well-dressed, normal affect, normal respiratory effort. Left lower extremity she does have a palpable dorsalis pedis pulse.  She has moderate soft tissue swelling in the ankle and leg.  Compartments are soft nontender she has some brawny skin changes no cellulitis or open wounds.  She has some bruising in her toes.  She is focally tender over the distal fibula.  X-rays reviewed from the  emergency room demonstrate minimally displaced Weber a distal fibula fracture overall well-maintained alignment  Imaging: No results found. No images are attached to the encounter.  Labs: Lab Results  Component Value Date   HGBA1C 6.8 (H) 04/01/2020   HGBA1C 6.0 (H) 03/07/2018   HGBA1C 6.4 (H) 09/04/2017   ESRSEDRATE 5 08/23/2012   ESRSEDRATE 16 07/18/2011   ESRSEDRATE 11 07/08/2010   REPTSTATUS 05/23/2019 FINAL 05/20/2019   CULT >=100,000 COLONIES/mL ESCHERICHIA COLI (A) 05/20/2019   LABORGA ESCHERICHIA COLI (A) 05/20/2019     Lab Results  Component Value Date   ALBUMIN 4.1 05/20/2019   ALBUMIN 4.5 03/04/2019   ALBUMIN 4.4 12/04/2018    Lab Results  Component Value Date   MG 2.0 04/24/2017   MG 2.0 04/23/2017   MG 2.0 06/29/2015   Lab Results  Component Value Date   VD25OH 52 05/16/2012    No results found for: PREALBUMIN CBC EXTENDED Latest Ref Rng & Units 04/01/2020 05/20/2019 03/04/2019  WBC 3.8 - 10.8 Thousand/uL 8.8 6.1 7.0  RBC 3.80 - 5.10 Million/uL 4.26 4.23 4.39  HGB 11.7 - 15.5 g/dL 13.3 13.2 13.7  HCT 35.0 - 45.0 % 39.9 40.9 42.2  PLT 140 - 400 Thousand/uL 242  199 207  NEUTROABS 1,500 - 7,800 cells/uL 5,878 3.3 4.0  LYMPHSABS 850 - 3,900 cells/uL 1,971 1.8 2.2     There is no height or weight on file to calculate BMI.  Orders:  No orders of the defined types were placed in this encounter.  Meds ordered this encounter  Medications  . HYDROcodone-acetaminophen (NORCO/VICODIN) 5-325 MG tablet    Sig: Take 1 tablet by mouth every 8 (eight) hours as needed for moderate pain.    Dispense:  20 tablet    Refill:  0     Procedures: No procedures performed  Clinical Data: No additional findings.  ROS:  All other systems negative, except as noted in the HPI. Review of Systems  Objective: Vital Signs: There were no vitals taken for this visit.  Specialty Comments:  No specialty comments available.  PMFS History: Patient Active Problem List    Diagnosis Date Noted  . Diabetic neuropathy ([place]) 01/28/2019  . Diabetes mellitus due to underlying condition, uncontrolled ([place]) 09/04/2017  . History of rectal bleeding 09/04/2017  . A-fib ([place]) 04/23/2017  . Diabetes mellitus type 2 in nonobese ([place]) 04/23/2017  . Sjogren's disease ([place]) 04/03/2016  . Primary osteoarthritis of both hands 04/03/2016  . High risk medication use 04/03/2016  . Senile dementia, with behavioral disturbance ([place]) 08/16/2015  . Swelling 08/16/2015  . Diastolic dysfunction 33/00/7622  . Hypertonicity, bladder 07/08/2015  . Arterial hypotension   . Bradycardia 06/29/2015  . Chronic lower back pain 06/29/2015  . PAF (paroxysmal atrial fibrillation) ([place]) 04/18/2015  . Dehydration 04/18/2015  . Dementia without behavioral disturbance ([place]) 04/18/2015  . TIA (transient ischemic attack) 12/17/2014  . Chest pain 12/16/2014  . Acute encephalopathy 12/04/2014  . Fall   . AKI (acute kidney injury) ([place]) 12/03/2014  . History of cerebrovascular disease 09/24/2014  . Falls frequently 07/28/2014  . Infarction of parietal lobe ([place])   . Mild dementia ([place])   . CVA (cerebral infarction) 07/26/2014  . Atrial fibrillation with RVR ([place]) 07/03/2014  . Constipation 04/15/2014  . Mixed stress and urge urinary incontinence 11/03/2013  . Therapeutic opioid induced constipation 11/03/2013  . Hemorrhoid 11/03/2013  . Low back pain associated with a spinal disorder other than radiculopathy or spinal stenosis 11/03/2013  . Protein-calorie malnutrition, severe ([place]) 07/22/2013  . UTI (lower urinary tract infection) 07/20/2013  . Prolonged QT interval 07/20/2013  . Osteopenia 07/17/2013  . Palpitations 06/30/2013  . Hereditary and idiopathic peripheral neuropathy 05/22/2013  . Abnormality of gait 12/05/2012  . Headache(784.0) 09/05/2012  . Multinodular thyroid 01/15/2012  . Neck pain 01/15/2012  . Hypercholesterolemia 07/08/2010  . Tear film insufficiency 07/06/2009   . [place] SYNDROME 07/06/2009  . Urge urinary incontinence 01/06/2009  . COLONIC POLYPS, ADENOMATOUS, HX OF 04/15/2008  . MITRAL VALVE PROLAPSE 11/05/2007  . ANXIETY DEPRESSION 07/02/2007  . Mononeuritis 07/02/2007  . HYPERTENSION, BENIGN 07/02/2007  . GERD 07/02/2007  . Irritable bowel syndrome 07/02/2007  . Fibromyalgia 07/02/2007   Past Medical History:  Diagnosis Date  . Abnormality of gait   . Adenomatous polyp of colon 2002   8mm  . Allergic rhinitis   . Anxiety   . Anxiety and depression   . Chronic back pain   . Dementia without behavioral disturbance ([place])   . Depression   . Diabetes ([place])   . Diverticulosis of colon   . Dry eye syndrome   . Dysphagia   . Dysthymic disorder   . Fall   . Fibromyalgia   .  GERD (gastroesophageal reflux disease)   . H/O hiatal hernia   . History of adverse drug reaction   . History of cerebrovascular disease 09/24/2014  . History of recurrent UTIs   . Hypertension, benign   . Irritable bowel syndrome   . Low back pain syndrome   . Memory loss   . Mitral valve prolapse   . Paroxysmal A-fib ([place])   . Peripheral neuropathy    "both feet and legs"  . Physical deconditioning   . Sjogren's syndrome ([place])   . Therapeutic opioid-induced constipation (OIC)   . Thyroid nodule   . Urinary incontinence     Family History  Problem Relation Age of Onset  . Heart disease Father        heart attack  . Pneumonia Father   . Heart attack Mother   . Hypertension Mother   . Colon cancer Sister   . Kidney disease Daughter   . Asthma Daughter   . Arthritis Daughter 35       osteo,  . Heart disease Son 53       stage 3 CHF(Diastolic /Systolic)  . Throat cancer Brother   . Hypertension Maternal Grandmother     Past Surgical History:  Procedure Laterality Date  . ABDOMINAL HYSTERECTOMY  1967  . APPENDECTOMY    . CARDIAC CATHETERIZATION  02/17/2003   normal L main, LAD free of disease, Cfx free of disease, RCA free of disease (Dr. Loni Muse.  [facility])  . CATARACT EXTRACTION, BILATERAL    . CHOLECYSTECTOMY  2000  . COLONOSCOPY W/ BIOPSIES     multiple  . DENTAL SURGERY     multiple tooth extractions  . ESOPHAGOGASTRODUODENOSCOPY (EGD) WITH ESOPHAGEAL DILATION N/A 08/23/2012   Procedure: ESOPHAGOGASTRODUODENOSCOPY (EGD) WITH ESOPHAGEAL DILATION;  Surgeon: Milus Banister, MD;  Location: [facility];  Service: Endoscopy;  Laterality: N/A;  . NASAL SEPTUM SURGERY  1980  . NM MYOCAR PERF WALL MOTION  2003   persantine - normal static and dynamic study w/apical thinning and presvered LV function, no ischemia  . SINUS EXPLORATION     ossifiying fibroma  . TEMPOROMANDIBULAR JOINT SURGERY  1986   Dr. Terence Lux  . TRANSTHORACIC ECHOCARDIOGRAM  2001   mild LVH, normal LV   Social History   Occupational History  . Occupation: Retired  Tobacco Use  . Smoking status: Former Research scientist (life sciences)  . Smokeless tobacco: Never Used  . Tobacco comment: Quit at age 41   Vaping Use  . Vaping Use: Never used  Substance and Sexual Activity  . Alcohol use: No  . Drug use: Never  . Sexual activity: Never

## 2020-04-21 ENCOUNTER — Ambulatory Visit: Payer: Medicare Other | Admitting: Cardiology

## 2020-04-23 ENCOUNTER — Telehealth: Payer: Self-pay | Admitting: *Deleted

## 2020-04-23 ENCOUNTER — Other Ambulatory Visit: Payer: Self-pay

## 2020-04-23 ENCOUNTER — Observation Stay (HOSPITAL_BASED_OUTPATIENT_CLINIC_OR_DEPARTMENT_OTHER): Payer: Medicare Other

## 2020-04-23 ENCOUNTER — Encounter (HOSPITAL_COMMUNITY): Payer: Self-pay | Admitting: Emergency Medicine

## 2020-04-23 ENCOUNTER — Emergency Department (HOSPITAL_COMMUNITY): Payer: Medicare Other

## 2020-04-23 ENCOUNTER — Observation Stay (HOSPITAL_COMMUNITY)
Admission: EM | Admit: 2020-04-23 | Discharge: 2020-04-27 | Disposition: A | Discharge status: Home / Self Care | Payer: Medicare Other | Attending: Internal Medicine | Admitting: Internal Medicine

## 2020-04-23 DIAGNOSIS — M7989 Other specified soft tissue disorders: Secondary | ICD-10-CM | POA: Diagnosis not present

## 2020-04-23 DIAGNOSIS — Z87891 Personal history of nicotine dependence: Secondary | ICD-10-CM | POA: Diagnosis not present

## 2020-04-23 DIAGNOSIS — Z79899 Other long term (current) drug therapy: Secondary | ICD-10-CM | POA: Insufficient documentation

## 2020-04-23 DIAGNOSIS — Z20822 Contact with and (suspected) exposure to covid-19: Secondary | ICD-10-CM | POA: Insufficient documentation

## 2020-04-23 DIAGNOSIS — E119 Type 2 diabetes mellitus without complications: Secondary | ICD-10-CM | POA: Insufficient documentation

## 2020-04-23 DIAGNOSIS — Z7984 Long term (current) use of oral hypoglycemic drugs: Secondary | ICD-10-CM | POA: Insufficient documentation

## 2020-04-23 DIAGNOSIS — Z8673 Personal history of transient ischemic attack (TIA), and cerebral infarction without residual deficits: Secondary | ICD-10-CM | POA: Diagnosis not present

## 2020-04-23 DIAGNOSIS — F0390 Unspecified dementia without behavioral disturbance: Secondary | ICD-10-CM | POA: Diagnosis present

## 2020-04-23 DIAGNOSIS — I609 Nontraumatic subarachnoid hemorrhage, unspecified: Secondary | ICD-10-CM | POA: Diagnosis not present

## 2020-04-23 DIAGNOSIS — I4891 Unspecified atrial fibrillation: Secondary | ICD-10-CM | POA: Diagnosis present

## 2020-04-23 DIAGNOSIS — Z7982 Long term (current) use of aspirin: Secondary | ICD-10-CM | POA: Diagnosis not present

## 2020-04-23 DIAGNOSIS — S0083XA Contusion of other part of head, initial encounter: Secondary | ICD-10-CM | POA: Diagnosis not present

## 2020-04-23 DIAGNOSIS — I10 Essential (primary) hypertension: Secondary | ICD-10-CM | POA: Insufficient documentation

## 2020-04-23 DIAGNOSIS — F0150 Vascular dementia without behavioral disturbance: Secondary | ICD-10-CM | POA: Diagnosis not present

## 2020-04-23 DIAGNOSIS — E0865 Diabetes mellitus due to underlying condition with hyperglycemia: Secondary | ICD-10-CM | POA: Diagnosis present

## 2020-04-23 DIAGNOSIS — Y92002 Bathroom of unspecified non-institutional (private) residence single-family (private) house as the place of occurrence of the external cause: Secondary | ICD-10-CM | POA: Diagnosis not present

## 2020-04-23 DIAGNOSIS — H5711 Ocular pain, right eye: Secondary | ICD-10-CM | POA: Insufficient documentation

## 2020-04-23 DIAGNOSIS — R0902 Hypoxemia: Secondary | ICD-10-CM | POA: Diagnosis not present

## 2020-04-23 DIAGNOSIS — I480 Paroxysmal atrial fibrillation: Secondary | ICD-10-CM

## 2020-04-23 DIAGNOSIS — M25551 Pain in right hip: Secondary | ICD-10-CM | POA: Diagnosis not present

## 2020-04-23 DIAGNOSIS — W19XXXA Unspecified fall, initial encounter: Secondary | ICD-10-CM | POA: Insufficient documentation

## 2020-04-23 DIAGNOSIS — S066X0A Traumatic subarachnoid hemorrhage without loss of consciousness, initial encounter: Secondary | ICD-10-CM | POA: Insufficient documentation

## 2020-04-23 DIAGNOSIS — S4982XA Other specified injuries of left shoulder and upper arm, initial encounter: Secondary | ICD-10-CM | POA: Diagnosis not present

## 2020-04-23 DIAGNOSIS — M25552 Pain in left hip: Secondary | ICD-10-CM | POA: Insufficient documentation

## 2020-04-23 DIAGNOSIS — Z043 Encounter for examination and observation following other accident: Secondary | ICD-10-CM | POA: Diagnosis not present

## 2020-04-23 DIAGNOSIS — S8262XA Displaced fracture of lateral malleolus of left fibula, initial encounter for closed fracture: Principal | ICD-10-CM | POA: Insufficient documentation

## 2020-04-23 DIAGNOSIS — M25512 Pain in left shoulder: Secondary | ICD-10-CM | POA: Diagnosis not present

## 2020-04-23 DIAGNOSIS — IMO0002 Reserved for concepts with insufficient information to code with codable children: Secondary | ICD-10-CM | POA: Diagnosis present

## 2020-04-23 DIAGNOSIS — S99912A Unspecified injury of left ankle, initial encounter: Secondary | ICD-10-CM | POA: Diagnosis present

## 2020-04-23 DIAGNOSIS — R609 Edema, unspecified: Secondary | ICD-10-CM | POA: Diagnosis not present

## 2020-04-23 DIAGNOSIS — M25572 Pain in left ankle and joints of left foot: Secondary | ICD-10-CM | POA: Diagnosis not present

## 2020-04-23 LAB — BASIC METABOLIC PANEL
Anion gap: 11 (ref 5–15)
BUN: 18 mg/dL (ref 8–23)
CO2: 27 mmol/L (ref 22–32)
Calcium: 9.8 mg/dL (ref 8.9–10.3)
Chloride: 101 mmol/L (ref 98–111)
Creatinine, Ser: 0.56 mg/dL (ref 0.44–1.00)
GFR, Estimated: >60 mL/min (ref >60)
Glucose, Bld: 137 mg/dL — ABNORMAL HIGH (ref 70–99)
Potassium: 4 mmol/L (ref 3.5–5.1)
Sodium: 139 mmol/L (ref 135–145)

## 2020-04-23 LAB — CBC
HCT: 37.4 % (ref 36.0–46.0)
Hemoglobin: 12.2 g/dL (ref 12.0–15.0)
MCH: 31.4 pg (ref 26.0–34.0)
MCHC: 32.6 g/dL (ref 30.0–36.0)
MCV: 96.4 fL (ref 80.0–100.0)
Platelets: 218 10*3/uL (ref 150–400)
RBC: 3.88 MIL/uL (ref 3.87–5.11)
RDW: 13.5 % (ref 11.5–15.5)
WBC: 6.5 10*3/uL (ref 4.0–10.5)
nRBC: 0 % (ref 0.0–0.2)

## 2020-04-23 LAB — RESP PANEL BY RT-PCR (FLU A&B, COVID) ARPGX2
Influenza A by PCR: NEGATIVE
Influenza B by PCR: NEGATIVE
SARS Coronavirus 2 by RT PCR: NEGATIVE

## 2020-04-23 MED ORDER — ACETAMINOPHEN 325 MG PO TABS
650.0000 mg | ORAL_TABLET | Freq: Four times a day (QID) | ORAL | Status: DC | PRN
  Administered 2020-04-23 – 2020-04-27 (×3): 650 mg via ORAL
  Filled 2020-04-23 (×4): qty 2

## 2020-04-23 MED ORDER — DULOXETINE HCL 30 MG PO CPEP
90.0000 mg | ORAL_CAPSULE | Freq: Every day | ORAL | Status: DC
  Administered 2020-04-23 – 2020-04-27 (×5): 90 mg via ORAL
  Filled 2020-04-23 (×5): qty 3

## 2020-04-23 MED ORDER — DULOXETINE HCL 30 MG PO CPEP: 60.0000 mg | ORAL_CAPSULE | Freq: Every day | ORAL | Status: DC

## 2020-04-23 MED ORDER — KETOROLAC TROMETHAMINE 15 MG/ML IJ SOLN
7.5000 mg | Freq: Once | INTRAMUSCULAR | Status: AC
  Administered 2020-04-23: 7.5 mg via INTRAVENOUS
  Filled 2020-04-23: qty 1

## 2020-04-23 MED ORDER — MEMANTINE HCL 10 MG PO TABS
10.0000 mg | ORAL_TABLET | Freq: Two times a day (BID) | ORAL | Status: DC
  Administered 2020-04-23 – 2020-04-27 (×9): 10 mg via ORAL
  Filled 2020-04-23 (×4): qty 1
  Filled 2020-04-23: qty 2
  Filled 2020-04-23 (×5): qty 1

## 2020-04-23 MED ORDER — LINACLOTIDE 145 MCG PO CAPS
290.0000 ug | ORAL_CAPSULE | Freq: Every day | ORAL | Status: DC
  Administered 2020-04-23 – 2020-04-27 (×5): 290 ug via ORAL
  Filled 2020-04-23 (×5): qty 2

## 2020-04-23 MED ORDER — ACETAMINOPHEN 650 MG RE SUPP: 650.0000 mg | Freq: Four times a day (QID) | RECTAL | Status: DC | PRN

## 2020-04-23 MED ORDER — MIRABEGRON ER 25 MG PO TB24
50.0000 mg | ORAL_TABLET | Freq: Every day | ORAL | Status: DC
  Administered 2020-04-23 – 2020-04-27 (×5): 50 mg via ORAL
  Filled 2020-04-23 (×5): qty 2

## 2020-04-23 MED ORDER — POLYETHYLENE GLYCOL 3350 17 G PO PACK: 17.0000 g | PACK | Freq: Every day | ORAL | Status: DC | PRN

## 2020-04-23 MED ORDER — PANTOPRAZOLE SODIUM 20 MG PO TBEC
20.0000 mg | DELAYED_RELEASE_TABLET | Freq: Every day | ORAL | Status: DC
  Administered 2020-04-23 – 2020-04-27 (×5): 20 mg via ORAL
  Filled 2020-04-23 (×5): qty 1

## 2020-04-23 MED ORDER — PREGABALIN 50 MG PO CAPS
100.0000 mg | ORAL_CAPSULE | Freq: Two times a day (BID) | ORAL | Status: DC
  Administered 2020-04-23 – 2020-04-27 (×9): 100 mg via ORAL
  Filled 2020-04-23 (×9): qty 2

## 2020-04-23 MED ORDER — SODIUM CHLORIDE 0.9% FLUSH
3.0000 mL | Freq: Two times a day (BID) | INTRAVENOUS | Status: DC
  Administered 2020-04-23 – 2020-04-26 (×7): 3 mL via INTRAVENOUS

## 2020-04-23 MED ORDER — LINAGLIPTIN 5 MG PO TABS
5.0000 mg | ORAL_TABLET | Freq: Every day | ORAL | Status: DC
  Administered 2020-04-23 – 2020-04-27 (×5): 5 mg via ORAL
  Filled 2020-04-23 (×5): qty 1

## 2020-04-23 NOTE — ED Notes (Signed)
ED TO INPATIENT HANDOFF REPORT  Name/Age/Gender Ariana [name] 87 y.o. female  Code Status    Code Status Orders  (From admission, onward)         Start     Ordered   04/23/20 1416  Full code  Continuous        04/23/20 1415        Code Status History    Date Active Date Inactive Code Status Order ID Comments User Context   04/23/2017 2004 04/26/2017 0203 Full Code [id number]  Norval Morton, MD ED   06/29/2015 2020 07/02/2015 0159 Full Code [id number]  Reubin Milan, MD Inpatient   04/18/2015 2111 04/21/2015 0131 Full Code [id number]  Orson Eva, MD Inpatient   12/16/2014 1700 12/18/2014 1852 Full Code [id number]  Waldemar Dickens, MD ED   12/04/2014 0119 12/08/2014 1931 Full Code [id number]  Rise Patience, MD Inpatient   07/26/2014 2136 07/28/2014 2242 Full Code [id number]  Hosie Poisson, MD Inpatient   07/20/2013 2150 07/22/2013 2217 Full Code [id number]  Hosie Poisson, MD Inpatient   05/06/2013 1822 05/07/2013 0342 Full Code [id number]  Suann Larry ED   08/21/2012 0445 08/24/2012 0022 Full Code [id number]  Rise Patience, MD Inpatient   07/18/2011 2208 07/25/2011 1747 Full Code [id number]  Rise Patience, MD Inpatient   Advance Care Planning Activity      Home/SNF/Other Home  Chief Complaint Fall [W19.XXXA]  Level of Care/Admitting Diagnosis ED Disposition    ED Disposition Condition Comment   Admit  Hospital Area: [facility] [[id number]]  Level of Care: Telemetry [5]  Admit to tele based on following criteria: Complex arrhythmia (Bradycardia/Tachycardia)  Covid Evaluation: Asymptomatic Screening Protocol (No Symptoms)  Diagnosis: Fall [[id number]]  Admitting Physician: Harold Hedge [[id number]]  Attending Physician: Harold Hedge [[id number]]       Medical History Past Medical History:  Diagnosis Date  . Abnormality of gait   . Adenomatous polyp of colon 2002   84mm  . Allergic rhinitis   . Anxiety   . Anxiety and depression   . Chronic  back pain   . Dementia without behavioral disturbance ([place])   . Depression   . Diabetes ([place])   . Diverticulosis of colon   . Dry eye syndrome   . Dysphagia   . Dysthymic disorder   . Fall   . Fibromyalgia   . GERD (gastroesophageal reflux disease)   . H/O hiatal hernia   . History of adverse drug reaction   . History of cerebrovascular disease 09/24/2014  . History of recurrent UTIs   . Hypertension, benign   . Irritable bowel syndrome   . Low back pain syndrome   . Memory loss   . Mitral valve prolapse   . Paroxysmal A-fib ([place])   . Peripheral neuropathy    "both feet and legs"  . Physical deconditioning   . Sjogren's syndrome ([place])   . Therapeutic opioid-induced constipation (OIC)   . Thyroid nodule   . Urinary incontinence     Allergies Allergies  Allergen Reactions  . Banana Nausea And Vomiting  . Codeine Nausea Only    unless given with Phenergan  . Klonopin [Clonazepam] Other (See Comments)    Causes hallucination   . Meperidine Hcl Nausea Only    unless given with Phenergan  . Norflex [Orphenadrine Citrate] Nausea Only    Unless given with Phenergan  . Oxycodone-Acetaminophen Nausea Only    unless given with phenergan  .  Propoxyphene Hcl Nausea Only    unless given with phenergan  . Zoloft [Sertraline Hcl] Other (See Comments)    Caused lethargy  . Doxycycline Other (See Comments)    Unknown reaction  . Naproxen Other (See Comments)    Unknown reaction  . Penicillins Other (See Comments)    Unknown reaction Has patient had a PCN reaction causing immediate rash, facial/tongue/throat swelling, SOB or lightheadedness with hypotension: Unknown Has patient had a PCN reaction causing severe rash involving mucus membranes or skin necrosis: Unknown Has patient had a PCN reaction that required hospitalization: pt was in the hospital at time of reaction Has patient had a PCN reaction occurring within the last 10 years: Unknown If all of the above answers are  "NO", then may proceed with Cephalos  . Phenothiazines Other (See Comments)    Unknown reaction  . Stelazine Other (See Comments)    Unknown reaction  . Sulfamethoxazole-Trimethoprim Other (See Comments)    Unknown reaction  . Tolectin [Tolmetin Sodium] Other (See Comments)    Unknown reaction  . Tramadol Other (See Comments)    Unknown reaction    IV Location/Drains/Wounds Patient Lines/Drains/Airways Status    Active Line/Drains/Airways    None          Labs/Imaging Results for orders placed or performed during the hospital encounter of 04/23/20 (from the past 48 hour(s))  CBC     Status: None   Collection Time: 04/23/20 10:51 AM  Result Value Ref Range   WBC 6.5 4.0 - 10.5 K/uL   RBC 3.88 3.87 - 5.11 MIL/uL   Hemoglobin 12.2 12.0 - 15.0 g/dL   HCT 37.4 36.0 - 46.0 %   MCV 96.4 80.0 - 100.0 fL   MCH 31.4 26.0 - 34.0 pg   MCHC 32.6 30.0 - 36.0 g/dL   RDW 13.5 11.5 - 15.5 %   Platelets 218 150 - 400 K/uL   nRBC 0.0 0.0 - 0.2 %    Comment: Performed at [facility], [place] [address] [place], [place] [address]  Basic metabolic panel     Status: Abnormal   Collection Time: 04/23/20 10:51 AM  Result Value Ref Range   Sodium 139 135 - 145 mmol/L   Potassium 4.0 3.5 - 5.1 mmol/L   Chloride 101 98 - 111 mmol/L   CO2 27 22 - 32 mmol/L   Glucose, Bld 137 (H) 70 - 99 mg/dL    Comment: Glucose reference range applies only to samples taken after fasting for at least 8 hours.   BUN 18 8 - 23 mg/dL   Creatinine, Ser 0.56 0.44 - 1.00 mg/dL   Calcium 9.8 8.9 - 10.3 mg/dL   GFR, Estimated >60 >60 mL/min    Comment: (NOTE) Calculated using the CKD-EPI Creatinine Equation (2021)    Anion gap 11 5 - 15    Comment: Performed at [facility], [place] [address], [place], [place] [address]   *Note: Due to a large number of results and/or encounters for the requested time period, some results have not been displayed. A complete set of results can be  found in Results Review.   DG Ankle Complete Left  Result Date: 04/23/2020 CLINICAL DATA:  Left ankle pain after fall. EXAM: LEFT ANKLE COMPLETE - 3+ VIEW COMPARISON:  Left ankle x-rays dated April 16, 2020. FINDINGS: Acute nondisplaced fracture of the distal fibula again noted. No new fracture. No dislocation. The ankle mortise is symmetric. The talar dome is intact.  Joint spaces are preserved. Osteopenia. Decreasing soft tissue swelling over the lateral malleolus. IMPRESSION: 1. Unchanged acute nondisplaced distal fibular fracture. No new fracture. Electronically Signed   By: Titus Dubin M.D.   On: 04/23/2020 11:56   CT Head Wo Contrast  Result Date: 04/23/2020 CLINICAL DATA:  Un witnessed fall. EXAM: CT HEAD WITHOUT CONTRAST CT MAXILLOFACIAL WITHOUT CONTRAST CT CERVICAL SPINE WITHOUT CONTRAST TECHNIQUE: Multidetector CT imaging of the head, cervical spine, and maxillofacial structures were performed using the standard protocol without intravenous contrast. Multiplanar CT image reconstructions of the cervical spine and maxillofacial structures were also generated. COMPARISON:  CT cervical spine from 12/03/2014. CT head December 13, 2018. FINDINGS: CT HEAD FINDINGS Brain: Small volume of acute subarachnoid hemorrhage along the right frontal convexity (for example see series 4, image 22). Some hemorrhage layering within the anterior aspect of the right sylvian fissure and overlying the anterior right temporal convexity (series 6, image 12; series 4, image 14). No definite acute intraparenchymal hemorrhage. 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 Similar additional patchy white matter hypodensities, most likely related to chronic microvascular ischemic disease. No evidence of acute large vascular territory infarct. No hydrocephalus. No evidence of intraventricular hemorrhage. No mass lesion or abnormal mass effect. Vascular: Calcific atherosclerosis. No hyperdense  vessel identified. Skull: No acute fracture. Other: No mastoid effusions. CT MAXILLOFACIAL FINDINGS Osseous: No fracture or mandibular dislocation. No destructive process. Chronic right posterior zygomatic cerclage wire. Orbits: No retro bulbar hematoma. Globes are symmetric and within normal limits. No proptosis. Sinuses: Chronic hypoplastic right maxillary sinus. The sinuses are largely clear. No air-fluid levels. Soft tissues: Right periorbital/malar soft tissue contusion. CT CERVICAL SPINE FINDINGS Alignment: Similar alignment. Mild levocurvature. No substantial sagittal subluxation. Skull base and vertebrae: Vertebral body heights are maintained. No evidence of acute fracture. Similar degenerative endplate Schmorl's node involving the superior C4 endplate. Soft tissues and spinal canal: No prevertebral fluid or swelling. No visible canal hematoma. Disc levels: Mild to moderate multilevel degenerative disc disease with disc height loss and endplate spurring. No evidence of significant bony canal stenosis. Upper chest: Biapical pleuroparenchymal scarring. Otherwise, visualized lung apices are clear. IMPRESSION: CT head: 1. Small volume of acute right frontotemporal subarachnoid hemorrhage, as detailed above. No mass effect. 2. Remote right subinsular and right frontal infarcts and chronic microvascular ischemic disease. CT maxillofacial 1. No acute fracture. 2. Right periorbital/malar contusion. CT cervical spine: 1. No evidence of acute fracture or traumatic malalignment. 2. Similar large left thyroid lesion, which was biopsied on Jun 18, 2013. Findings discussed with Dr. Tomi Bamberger via telephone at 12:40 PM. Electronically Signed   By: Margaretha Sheffield MD   On: 04/23/2020 12:43   CT Cervical Spine Wo Contrast  Result Date: 04/23/2020 CLINICAL DATA:  Un witnessed fall. EXAM: CT HEAD WITHOUT CONTRAST CT MAXILLOFACIAL WITHOUT CONTRAST CT CERVICAL SPINE WITHOUT CONTRAST TECHNIQUE: Multidetector CT imaging of the  head, cervical spine, and maxillofacial structures were performed using the standard protocol without intravenous contrast. Multiplanar CT image reconstructions of the cervical spine and maxillofacial structures were also generated. COMPARISON:  CT cervical spine from 12/03/2014. CT head December 13, 2018. FINDINGS: CT HEAD FINDINGS Brain: Small volume of acute subarachnoid hemorrhage along the right frontal convexity (for example see series 4, image 22). Some hemorrhage layering within the anterior aspect of the right sylvian fissure and overlying the anterior right temporal convexity (series 6, image 12; series 4, image 14). No definite acute intraparenchymal hemorrhage. Similar prior right subinsular and right  parietal infarcts with encephalomalacia. Similar additional patchy white matter hypodensities, most likely related to chronic microvascular ischemic disease. No evidence of acute large vascular territory infarct. No hydrocephalus. No evidence of intraventricular hemorrhage. No mass lesion or abnormal mass effect. Vascular: Calcific atherosclerosis. No hyperdense vessel identified. Skull: No acute fracture. Other: No mastoid effusions. CT MAXILLOFACIAL FINDINGS Osseous: No fracture or mandibular dislocation. No destructive process. Chronic right posterior zygomatic cerclage wire. Orbits: No retro bulbar hematoma. Globes are symmetric and within normal limits. No proptosis. Sinuses: Chronic hypoplastic right maxillary sinus. The sinuses are largely clear. No air-fluid levels. Soft tissues: Right periorbital/malar soft tissue contusion. CT CERVICAL SPINE FINDINGS Alignment: Similar alignment. Mild levocurvature. No substantial sagittal subluxation. Skull base and vertebrae: Vertebral body heights are maintained. No evidence of acute fracture. Similar degenerative endplate Schmorl's node involving the superior C4 endplate. Soft tissues and spinal canal: No prevertebral fluid or swelling. No visible canal  hematoma. Disc levels: Mild to moderate multilevel degenerative disc disease with disc height loss and endplate spurring. No evidence of significant bony canal stenosis. Upper chest: Biapical pleuroparenchymal scarring. Otherwise, visualized lung apices are clear. IMPRESSION: CT head: 1. Small volume of acute right frontotemporal subarachnoid hemorrhage, as detailed above. No mass effect. 2. Remote right subinsular and right frontal infarcts and chronic microvascular ischemic disease. CT maxillofacial 1. No acute fracture. 2. Right periorbital/malar contusion. CT cervical spine: 1. No evidence of acute fracture or traumatic malalignment. 2. Similar large left thyroid lesion, which was biopsied on Jun 18, 2013. Findings discussed with Dr. Tomi Bamberger via telephone at 12:40 PM. Electronically Signed   By: Margaretha Sheffield MD   On: 04/23/2020 12:43   DG Chest Portable 1 View  Result Date: 04/23/2020 CLINICAL DATA:  Fall. EXAM: PORTABLE CHEST 1 VIEW COMPARISON:  Chest x-ray dated December 16, 2019. FINDINGS: The heart is at the upper limits of normal in size. Normal pulmonary vascularity. No focal consolidation, pleural effusion, or pneumothorax. No acute osseous abnormality. IMPRESSION: No active disease. Electronically Signed   By: Titus Dubin M.D.   On: 04/23/2020 12:00   DG Shoulder Left  Result Date: 04/23/2020 CLINICAL DATA:  Left shoulder pain after fall. EXAM: LEFT SHOULDER - 2+ VIEW COMPARISON:  Left shoulder x-rays dated Jul 11, 2019. FINDINGS: No acute fracture or dislocation. Unchanged mild acromioclavicular osteoarthritis. Soft tissues are unremarkable. IMPRESSION: 1. No acute osseous abnormality. Electronically Signed   By: Titus Dubin M.D.   On: 04/23/2020 11:57   DG Hip Unilat W or Wo Pelvis 2-3 Views Left  Result Date: 04/23/2020 CLINICAL DATA:  Left hip pain after fall. EXAM: DG HIP (WITH OR WITHOUT PELVIS) 2-3V LEFT COMPARISON:  Left hip x-rays dated April 16, 2020. FINDINGS: No acute  fracture or dislocation. Unchanged mild bilateral hip osteoarthritis. Osteopenia. Soft tissues are unremarkable. IMPRESSION: 1. No acute osseous abnormality. Electronically Signed   By: Titus Dubin M.D.   On: 04/23/2020 11:58   VAS [place] LOWER EXTREMITY VENOUS (DVT)  Result Date: 04/23/2020  Lower Venous DVT Study Indications: Swelling.  Risk Factors: Trauma. Limitations: Body habitus and poor ultrasound/tissue interface. Comparison Study: No prior studies. Performing Technologist: Oliver Hum RVT  Examination Guidelines: A complete evaluation includes B-mode imaging, spectral Doppler, color Doppler, and power Doppler as needed of all accessible portions of each vessel. Bilateral testing is considered an integral part of a complete examination. Limited examinations for reoccurring indications may be performed as noted. The reflux portion of the exam is performed with the patient in reverse  Trendelenburg.  +-----+---------------+---------+-----------+----------+--------------+ RIGHTCompressibilityPhasicitySpontaneityPropertiesThrombus Aging +-----+---------------+---------+-----------+----------+--------------+ CFV  Full           Yes      Yes                                 +-----+---------------+---------+-----------+----------+--------------+   +---------+---------------+---------+-----------+----------+--------------+ LEFT     CompressibilityPhasicitySpontaneityPropertiesThrombus Aging +---------+---------------+---------+-----------+----------+--------------+ CFV      Full           Yes      Yes                                 +---------+---------------+---------+-----------+----------+--------------+ SFJ      Full                                                        +---------+---------------+---------+-----------+----------+--------------+ FV Prox  Full                                                         +---------+---------------+---------+-----------+----------+--------------+ FV Mid   Full                                                        +---------+---------------+---------+-----------+----------+--------------+ FV DistalFull                                                        +---------+---------------+---------+-----------+----------+--------------+ PFV      Full                                                        +---------+---------------+---------+-----------+----------+--------------+ POP      Full           Yes      Yes                                 +---------+---------------+---------+-----------+----------+--------------+ PTV      Full                                                        +---------+---------------+---------+-----------+----------+--------------+ PERO     Full                                                        +---------+---------------+---------+-----------+----------+--------------+  Summary: RIGHT: - No evidence of common femoral vein obstruction.  LEFT: - There is no evidence of deep vein thrombosis in the lower extremity. However, portions of this examination were limited- see technologist comments above.  - No cystic structure found in the popliteal fossa.  *See table(s) above for measurements and observations.    Preliminary    CT Maxillofacial Wo Contrast  Result Date: 04/23/2020 CLINICAL DATA:  Un witnessed fall. EXAM: CT HEAD WITHOUT CONTRAST CT MAXILLOFACIAL WITHOUT CONTRAST CT CERVICAL SPINE WITHOUT CONTRAST TECHNIQUE: Multidetector CT imaging of the head, cervical spine, and maxillofacial structures were performed using the standard protocol without intravenous contrast. Multiplanar CT image reconstructions of the cervical spine and maxillofacial structures were also generated. COMPARISON:  CT cervical spine from 12/03/2014. CT head December 13, 2018. FINDINGS: CT HEAD FINDINGS Brain: Small volume of acute  subarachnoid hemorrhage along the right frontal convexity (for example see series 4, image 22). Some hemorrhage layering within the anterior aspect of the right sylvian fissure and overlying the anterior right temporal convexity (series 6, image 12; series 4, image 14). No definite acute intraparenchymal hemorrhage. 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 Similar additional patchy white matter hypodensities, most likely related to chronic microvascular ischemic disease. No evidence of acute large vascular territory infarct. No hydrocephalus. No evidence of intraventricular hemorrhage. No mass lesion or abnormal mass effect. Vascular: Calcific atherosclerosis. No hyperdense vessel identified. Skull: No acute fracture. Other: No mastoid effusions. CT MAXILLOFACIAL FINDINGS Osseous: No fracture or mandibular dislocation. No destructive process. Chronic right posterior zygomatic cerclage wire. Orbits: No retro bulbar hematoma. Globes are symmetric and within normal limits. No proptosis. Sinuses: Chronic hypoplastic right maxillary sinus. The sinuses are largely clear. No air-fluid levels. Soft tissues: Right periorbital/malar soft tissue contusion. CT CERVICAL SPINE FINDINGS Alignment: Similar alignment. Mild levocurvature. No substantial sagittal subluxation. Skull base and vertebrae: Vertebral body heights are maintained. No evidence of acute fracture. Similar degenerative endplate Schmorl's node involving the superior C4 endplate. Soft tissues and spinal canal: No prevertebral fluid or swelling. No visible canal hematoma. Disc levels: Mild to moderate multilevel degenerative disc disease with disc height loss and endplate spurring. No evidence of significant bony canal stenosis. Upper chest: Biapical pleuroparenchymal scarring. Otherwise, visualized lung apices are clear. IMPRESSION: CT head: 1. Small volume of acute right frontotemporal subarachnoid hemorrhage, as detailed  above. No mass effect. 2. Remote right subinsular and right frontal infarcts and chronic microvascular ischemic disease. CT maxillofacial 1. No acute fracture. 2. Right periorbital/malar contusion. CT cervical spine: 1. No evidence of acute fracture or traumatic malalignment. 2. Similar large left thyroid lesion, which was biopsied on Jun 18, 2013. Findings discussed with Dr. Tomi Bamberger via telephone at 12:40 PM. Electronically Signed   By: Margaretha Sheffield MD   On: 04/23/2020 12:43    Pending Labs Unresulted Labs (From admission, onward)          Start     Ordered   04/24/20 2694  Basic metabolic panel  Daily,   R      04/23/20 1415   04/24/20 0500  CBC  Daily,   R      04/23/20 1415   04/23/20 1340  Resp Panel by RT-PCR (Flu A&B, Covid) Nasopharyngeal Swab  (Tier 2 - Symptomatic/asymptomatic with Precautions )  Once,   STAT       Question Answer Comment  Is this test for diagnosis or screening Screening   Symptomatic for COVID-19 as defined by CDC No  Hospitalized for COVID-19 No   Admitted to ICU for COVID-19 No   Previously tested for COVID-19 No   Resident in a congregate (group) care setting No   Employed in healthcare setting No   Pregnant No   Has patient completed COVID vaccination(s) (2 doses of Pfizer/Moderna 1 dose of [employer]) Unknown      04/23/20 1341          Vitals/Pain Today's Vitals   04/23/20 1315 04/23/20 1330 04/23/20 1345 04/23/20 1400  BP: 133/64 136/86 (!) 142/73 129/85  Pulse: (!) 52 (!) 56 60 (!) 52  Resp: 14 12 (!) 21 13  Temp:      TempSrc:      SpO2: 96% 96% 94% 96%  Weight:      Height:      PainSc:        Isolation Precautions Airborne and Contact precautions  Medications Medications  DULoxetine (CYMBALTA) DR capsule 30 mg (has no administration in time range)  DULoxetine (CYMBALTA) DR capsule 60 mg (has no administration in time range)  memantine (NAMENDA) tablet 10 mg (has no administration in time range)  linagliptin  (TRADJENTA) tablet 5 mg (has no administration in time range)  pantoprazole (PROTONIX) EC tablet 20 mg (has no administration in time range)  linaclotide (LINZESS) capsule 290 mcg (has no administration in time range)  polyethylene glycol (MIRALAX / GLYCOLAX) packet 17 g (has no administration in time range)  mirabegron ER (MYRBETRIQ) tablet 50 mg (has no administration in time range)  pregabalin (LYRICA) capsule 100 mg (has no administration in time range)  sodium chloride flush (NS) 0.9 % injection 3 mL (has no administration in time range)  acetaminophen (TYLENOL) tablet 650 mg (has no administration in time range)    Or  acetaminophen (TYLENOL) suppository 650 mg (has no administration in time range)    Mobility walks

## 2020-04-23 NOTE — Telephone Encounter (Signed)
Daughter called back and notified.

## 2020-04-23 NOTE — ED Triage Notes (Addendum)
Patient had an unwitnessed fall this morning, states she was trying to go the bathroom and fell. Found face-down on the floor by her morning CNA. R eye is bruised and swollen, L shoulder deformity, L ankle swelling (previous recent break) and L hip pain.

## 2020-04-23 NOTE — Progress Notes (Signed)
   04/23/20 1624  Assess: MEWS Score  Temp 98.1 F (36.7 C)  BP 99/81  Pulse Rate 65  SpO2 97 %  O2 Device Room Air  Assess: MEWS Score  MEWS Temp 0  MEWS Systolic 1  MEWS Pulse 0  MEWS RR 1  MEWS LOC 0  MEWS Score 2  MEWS Score Color Yellow  Assess: if the MEWS score is Yellow or Red  Were vital signs taken at a resting state? Yes  Focused Assessment No change from prior assessment  Early Detection of Sepsis Score *See Row Information* Low  MEWS guidelines implemented *See Row Information* Yes  Treat  Pain Scale 0-10  Pain Score 8  Pain Type Acute pain  Pain Location Ankle  2nd Pain Site  Pain Score 8  Pain Location Face  Pain Type Acute pain  Take Vital Signs  Increase Vital Sign Frequency  Yellow: Q 2hr X 2 then Q 4hr X 2, if remains yellow, continue Q 4hrs  Escalate  MEWS: Escalate Yellow: discuss with charge nurse/RN and consider discussing with provider and RRT  Notify: Charge Nurse/RN  Name of Charge Nurse/RN Notified Adela Ports, RN  Date Charge Nurse/RN Notified 04/23/20  Time Charge Nurse/RN Notified [id number]  Document  Progress note created (see row info) Yes

## 2020-04-23 NOTE — ED Provider Notes (Signed)
[facility] DE[facility] Provider Note   CSN: [id number] Arrival date & time: 04/23/20  1016     History Chief Complaint  Patient presents with  . Fall  . Eye Pain  . Shoulder Injury  . Hip Pain  . Ankle Pain    Ariana [name] is a 87 y.o. female.  HPI   Patient presented to the ED for evaluation after fall.  States she was at home when she is trying to get up to go to the bathroom and she fell.  She does not know exactly why she fell but did not pass out and did not get weak and fall.  She may have stumbled but she is also having pain in her left ankle that has caused some issues with her ambulation.  Patient fell forward hitting her face.  She did not lose consciousness but is having significant pain and swelling around her right eye.  She does have new pain in her shoulder as well as left ankle that she had before and her left hip.  Patient denies any fevers or chills.  No cough no shortness of breath.  Past Medical History:  Diagnosis Date  . Abnormality of gait   . Adenomatous polyp of colon 2002   13mm  . Allergic rhinitis   . Anxiety   . Anxiety and depression   . Chronic back pain   . Dementia without behavioral disturbance ([place])   . Depression   . Diabetes ([place])   . Diverticulosis of colon   . Dry eye syndrome   . Dysphagia   . Dysthymic disorder   . Fall   . Fibromyalgia   . GERD (gastroesophageal reflux disease)   . H/O hiatal hernia   . History of adverse drug reaction   . History of cerebrovascular disease 09/24/2014  . History of recurrent UTIs   . Hypertension, benign   . Irritable bowel syndrome   . Low back pain syndrome   . Memory loss   . Mitral valve prolapse   . Paroxysmal A-fib ([place])   . Peripheral neuropathy    "both feet and legs"  . Physical deconditioning   . Sjogren's syndrome ([place])   . Therapeutic opioid-induced constipation (OIC)   . Thyroid nodule   . Urinary incontinence     Patient Active Problem  List   Diagnosis Date Noted  . Diabetic neuropathy ([place]) 01/28/2019  . Diabetes mellitus due to underlying condition, uncontrolled ([place]) 09/04/2017  . History of rectal bleeding 09/04/2017  . A-fib ([place]) 04/23/2017  . Diabetes mellitus type 2 in nonobese ([place]) 04/23/2017  . Sjogren's disease ([place]) 04/03/2016  . Primary osteoarthritis of both hands 04/03/2016  . High risk medication use 04/03/2016  . Senile dementia, with behavioral disturbance ([place]) 08/16/2015  . Swelling 08/16/2015  . Diastolic dysfunction 27/04/5007  . Hypertonicity, bladder 07/08/2015  . Arterial hypotension   . Bradycardia 06/29/2015  . Chronic lower back pain 06/29/2015  . PAF (paroxysmal atrial fibrillation) ([place]) 04/18/2015  . Dehydration 04/18/2015  . Dementia without behavioral disturbance ([place]) 04/18/2015  . TIA (transient ischemic attack) 12/17/2014  . Chest pain 12/16/2014  . Acute encephalopathy 12/04/2014  . Fall   . AKI (acute kidney injury) ([place]) 12/03/2014  . History of cerebrovascular disease 09/24/2014  . Falls frequently 07/28/2014  . Infarction of parietal lobe ([place])   . Mild dementia ([place])   . CVA (cerebral infarction) 07/26/2014  . Atrial fibrillation with RVR ([place]) 07/03/2014  .  Constipation 04/15/2014  . Mixed stress and urge urinary incontinence 11/03/2013  . Therapeutic opioid induced constipation 11/03/2013  . Hemorrhoid 11/03/2013  . Low back pain associated with a spinal disorder other than radiculopathy or spinal stenosis 11/03/2013  . Protein-calorie malnutrition, severe ([place]) 07/22/2013  . UTI (lower urinary tract infection) 07/20/2013  . Prolonged QT interval 07/20/2013  . Osteopenia 07/17/2013  . Palpitations 06/30/2013  . Hereditary and idiopathic peripheral neuropathy 05/22/2013  . Abnormality of gait 12/05/2012  . Headache(784.0) 09/05/2012  . Multinodular thyroid 01/15/2012  . Neck pain 01/15/2012  . Hypercholesterolemia 07/08/2010  . Tear film insufficiency  07/06/2009  . [place] SYNDROME 07/06/2009  . Urge urinary incontinence 01/06/2009  . COLONIC POLYPS, ADENOMATOUS, HX OF 04/15/2008  . MITRAL VALVE PROLAPSE 11/05/2007  . ANXIETY DEPRESSION 07/02/2007  . Mononeuritis 07/02/2007  . HYPERTENSION, BENIGN 07/02/2007  . GERD 07/02/2007  . Irritable bowel syndrome 07/02/2007  . Fibromyalgia 07/02/2007    Past Surgical History:  Procedure Laterality Date  . ABDOMINAL HYSTERECTOMY  1967  . APPENDECTOMY    . CARDIAC CATHETERIZATION  02/17/2003   normal L main, LAD free of disease, Cfx free of disease, RCA free of disease (Dr. Loni Muse. [place])  . CATARACT EXTRACTION, BILATERAL    . CHOLECYSTECTOMY  2000  . COLONOSCOPY W/ BIOPSIES     multiple  . DENTAL SURGERY     multiple tooth extractions  . ESOPHAGOGASTRODUODENOSCOPY (EGD) WITH ESOPHAGEAL DILATION N/A 08/23/2012   Procedure: ESOPHAGOGASTRODUODENOSCOPY (EGD) WITH ESOPHAGEAL DILATION;  Surgeon: Milus Banister, MD;  Location: [facility] END[facility]COPY;  Service: Endoscopy;  Laterality: N/A;  . NASAL SEPTUM SURGERY  1980  . NM MYOCAR PERF WALL MOTION  2003   persantine - normal static and dynamic study w/apical thinning and presvered LV function, no ischemia  . SINUS EXPLORATION     ossifiying fibroma  . TEMPOROMANDIBULAR JOINT SURGERY  1986   Dr. Terence Lux  . TRANSTHORACIC ECHOCARDIOGRAM  2001   mild LVH, normal LV     OB History   No obstetric history on file.     Family History  Problem Relation Age of Onset  . Heart disease Father        heart attack  . Pneumonia Father   . Heart attack Mother   . Hypertension Mother   . Colon cancer Sister   . Kidney disease Daughter   . Asthma Daughter   . Arthritis Daughter 15       osteo,  . Heart disease Son 70       stage 3 CHF(Diastolic /Systolic)  . Throat cancer Brother   . Hypertension Maternal Grandmother     Social History   Tobacco Use  . Smoking status: Former Research scientist (life sciences)  . Smokeless tobacco: Never Used  . Tobacco comment: Quit at age  53   Vaping Use  . Vaping Use: Never used  Substance Use Topics  . Alcohol use: No  . Drug use: Never    Home Medications Prior to Admission medications   Medication Sig Start Date End Date Taking? Authorizing Provider  acetaminophen (TYLENOL) 500 MG tablet Take 1,000 mg by mouth 2 (two) times daily.    [provider]  ALPRAZolam Duanne Moron) 0.25 MG tablet Take 1 tablet by mouth twice daily as needed 04/02/20   Mariea Clonts, Tiffany L, DO  antiseptic oral rinse (BIOTENE) LIQD 15 mLs by Mouth Rinse route 5 (five) times daily as needed for dry mouth.    [provider]  aspirin 325 MG tablet  Take 1 tablet (325 mg total) by mouth daily. 07/28/14   Reyne Dumas, MD  Capsaicin-Menthol-Methyl Sal (CAPSAICIN-METHYL SAL-MENTHOL) 0.025-1-12 % CREA Apply 1 application topically 2 (two) times daily. 09/11/18   Ngetich, Dinah C, NP  carboxymethylcellulose (REFRESH PLUS) 0.5 % SOLN Place 1 drop into both eyes 5 (five) times daily as needed (dry eyes).    [provider]  CRANBERRY PO Take 2 tablets by mouth daily.    [provider]  diltiazem (CARDIZEM CD) 180 MG 24 hr capsule TAKE 1 CAPSULE BY MOUTH ONCE DAILY 06/09/19   Hilty, Nadean Corwin, MD  DULoxetine (CYMBALTA) 30 MG capsule Take 1 capsule by mouth daly for back pain and depression in combination with 60 mg tablet 01/29/20   Reed, Tiffany L, DO  DULoxetine (CYMBALTA) 60 MG capsule Take 1 capsule by mouth once daily 01/29/20   Reed, Tiffany L, DO  furosemide (LASIX) 40 MG tablet Take 1 tablet by mouth once daily 02/10/20   Reed, Tiffany L, DO  glucose blood (ONETOUCH VERIO) test strip Use to test blood sugar daily. Dx:E08.65 10/29/19   Reed, Tiffany L, DO  HYDROcodone-acetaminophen (NORCO/VICODIN) 5-325 MG tablet Take 1 tablet by mouth every 8 (eight) hours as needed for moderate pain. 04/20/20   Persons, Bevely Palmer, [place]  JANUVIA 100 MG tablet Take 1 tablet by mouth once daily 04/02/20   Reed, Tiffany L, DO  Lancets Micro Thin 33G  MISC Use to test blood sugar daily. Dx: E08.65 10/29/19   Reed, Tiffany L, DO  lansoprazole (PREVACID) 30 MG capsule TAKE 1 CAPSULE BY MOUTH ONCE DAILY AT NOON 03/15/20   Reed, Tiffany L, DO  LINZESS 290 MCG CAPS capsule TAKE 1 CAPSULE BY MOUTH EVERY DAY AT BEDTIME 03/25/20   Gatha Mayer, MD  loratadine (CLARITIN) 10 MG tablet Take 10 mg by mouth daily.    [provider]  memantine (NAMENDA) 10 MG tablet Take 1 tablet by mouth twice daily 02/26/20   Mariea Clonts, Tiffany L, DO  MYRBETRIQ 50 MG TB24 tablet Take 1 tablet by mouth once daily 04/12/20   Mariea Clonts, Tiffany L, DO  pilocarpine (SALAGEN) 5 MG tablet Take 1 tablet by mouth twice daily 02/02/20   Bo Merino, MD  polyethylene glycol (MIRALAX / GLYCOLAX) packet Take by mouth daily as needed (constipation). Mix 1/2 capful of miralax in 8 oz liquid and drink    [provider]  potassium chloride SA (KLOR-CON) 20 MEQ tablet Take 1 tablet by mouth once daily 01/29/20   Reed, Tiffany L, DO  pregabalin (LYRICA) 100 MG capsule Take 1 capsule by mouth twice daily 04/02/20   Reed, Tiffany L, DO  senna-docusate (SENNA S) 8.6-50 MG tablet Take 1 tablet by mouth daily. 04/01/20   Reed, Tiffany L, DO    Allergies    Banana, Codeine, Klonopin [clonazepam], Meperidine hcl, Norflex [orphenadrine citrate], Oxycodone-acetaminophen, Propoxyphene hcl, Zoloft [sertraline hcl], Doxycycline, Naproxen, Penicillins, Phenothiazines, Stelazine, Sulfamethoxazole-trimethoprim, Tolectin [tolmetin sodium], and Tramadol  Review of Systems   Review of Systems  All other systems reviewed and are negative.   Physical Exam Updated Vital Signs BP 136/86   Pulse (!) 56   Temp 97.6 F (36.4 C) (Oral)   Resp 12   Ht 1.6 m (5\' 3" )   Wt 68 kg   SpO2 96%   BMI 26.56 kg/m   Physical Exam Vitals and nursing note reviewed.  Constitutional:      Appearance: She is well-developed. She is ill-appearing.  HENT:  Head: Normocephalic.     Comments: Edema and  bruising around the right eye making it difficult for the patient to open her eyelids, tenderness palpation periorbital region    Right Ear: External ear normal.     Left Ear: External ear normal.  Eyes:     General: No scleral icterus.       Right eye: No discharge.        Left eye: No discharge.     Extraocular Movements: Extraocular movements intact.     Conjunctiva/sclera: Conjunctivae normal.     Pupils: Pupils are equal, round, and reactive to light.     Comments: Subconjunctival hemorrhage on the right eye  Neck:     Trachea: No tracheal deviation.  Cardiovascular:     Rate and Rhythm: Normal rate and regular rhythm.  Pulmonary:     Effort: Pulmonary effort is normal. No respiratory distress.     Breath sounds: Normal breath sounds. No stridor. No wheezing or rales.  Abdominal:     General: Bowel sounds are normal. There is no distension.     Palpations: Abdomen is soft.     Tenderness: There is no abdominal tenderness. There is no guarding or rebound.  Musculoskeletal:        General: Tenderness present.     Right shoulder: No swelling, tenderness or bony tenderness.     Left shoulder: Tenderness present. No swelling or bony tenderness.     Right wrist: No swelling, tenderness or bony tenderness.     Left wrist: No swelling, tenderness or bony tenderness.     Cervical back: Neck supple. No swelling, tenderness or bony tenderness.     Thoracic back: No swelling, tenderness or bony tenderness.     Lumbar back: No swelling, tenderness or bony tenderness.     Right hip: No tenderness or bony tenderness. Normal range of motion.     Left hip: Tenderness and bony tenderness present. Normal range of motion.     Right ankle: No swelling. No tenderness.     Left ankle: No swelling. Tenderness present.     Comments: Tenderness to palpation lateral aspect left hip, patient is able to range her hip without discomfort, tenderness palpation left ankle, old bruising noted around the toes  on the left foot  Skin:    General: Skin is warm and dry.     Findings: No rash.  Neurological:     Mental Status: She is alert.     Cranial Nerves: No cranial nerve deficit (no facial droop, extraocular movements intact, no slurred speech).     Sensory: No sensory deficit.     Motor: No abnormal muscle tone or seizure activity.     Coordination: Coordination normal.     ED Results / Procedures / Treatments   Labs (all labs ordered are listed, but only abnormal results are displayed) Labs Reviewed  BASIC METABOLIC PANEL - Abnormal; Notable for the following components:      Result Value   Glucose, Bld 137 (*)    All other components within normal limits  RESP PANEL BY RT-PCR (FLU A&B, COVID) [id number]  CBC    EKG EKG Interpretation  Date/Time:  Friday April 23 2020 10:34:08 EST Ventricular Rate:  57 PR Interval:    QRS Duration: 102 QT Interval:  447 QTC Calculation: 436 R Axis:   -37 Text Interpretation: Sinus rhythm Short PR interval Left axis deviation Borderline T abnormalities, anterior leads No significant change since last tracing Confirmed  by Dorie Rank [id number]) on 04/23/2020 11:04:03 AM   Radiology DG Ankle Complete Left  Result Date: 04/23/2020 CLINICAL DATA:  Left ankle pain after fall. EXAM: LEFT ANKLE COMPLETE - 3+ VIEW COMPARISON:  Left ankle x-rays dated April 16, 2020. FINDINGS: Acute nondisplaced fracture of the distal fibula again noted. No new fracture. No dislocation. The ankle mortise is symmetric. The talar dome is intact. Joint spaces are preserved. Osteopenia. Decreasing soft tissue swelling over the lateral malleolus. IMPRESSION: 1. Unchanged acute nondisplaced distal fibular fracture. No new fracture. Electronically Signed   By: Titus Dubin M.D.   On: 04/23/2020 11:56   CT Head Wo Contrast  Result Date: 04/23/2020 CLINICAL DATA:  Un witnessed fall. EXAM: CT HEAD WITHOUT CONTRAST CT MAXILLOFACIAL WITHOUT CONTRAST CT CERVICAL SPINE WITHOUT CONTRAST  TECHNIQUE: Multidetector CT imaging of the head, cervical spine, and maxillofacial structures were performed using the standard protocol without intravenous contrast. Multiplanar CT image reconstructions of the cervical spine and maxillofacial structures were also generated. COMPARISON:  CT cervical spine from 12/03/2014. CT head December 13, 2018. FINDINGS: CT HEAD FINDINGS Brain: Small volume of acute subarachnoid hemorrhage along the right frontal convexity (for example see series 4, image 22). Some hemorrhage layering within the anterior aspect of the right sylvian fissure and overlying the anterior right temporal convexity (series 6, image 12; series 4, image 14). No definite acute intraparenchymal hemorrhage. 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 Similar additional patchy white matter hypodensities, most likely related to chronic microvascular ischemic disease. No evidence of acute large vascular territory infarct. No hydrocephalus. No evidence of intraventricular hemorrhage. No mass lesion or abnormal mass effect. Vascular: Calcific atherosclerosis. No hyperdense vessel identified. Skull: No acute fracture. Other: No mastoid effusions. CT MAXILLOFACIAL FINDINGS Osseous: No fracture or mandibular dislocation. No destructive process. Chronic right posterior zygomatic cerclage wire. Orbits: No retro bulbar hematoma. Globes are symmetric and within normal limits. No proptosis. Sinuses: Chronic hypoplastic right maxillary sinus. The sinuses are largely clear. No air-fluid levels. Soft tissues: Right periorbital/malar soft tissue contusion. CT CERVICAL SPINE FINDINGS Alignment: Similar alignment. Mild levocurvature. No substantial sagittal subluxation. Skull base and vertebrae: Vertebral body heights are maintained. No evidence of acute fracture. Similar degenerative endplate Schmorl's node involving the superior C4 endplate. Soft tissues and spinal canal: No prevertebral  fluid or swelling. No visible canal hematoma. Disc levels: Mild to moderate multilevel degenerative disc disease with disc height loss and endplate spurring. No evidence of significant bony canal stenosis. Upper chest: Biapical pleuroparenchymal scarring. Otherwise, visualized lung apices are clear. IMPRESSION: CT head: 1. Small volume of acute right frontotemporal subarachnoid hemorrhage, as detailed above. No mass effect. 2. Remote right subinsular and right frontal infarcts and chronic microvascular ischemic disease. CT maxillofacial 1. No acute fracture. 2. Right periorbital/malar contusion. CT cervical spine: 1. No evidence of acute fracture or traumatic malalignment. 2. Similar large left thyroid lesion, which was biopsied on Jun 18, 2013. Findings discussed with Dr. Tomi Bamberger via telephone at 12:40 PM. Electronically Signed   By: Margaretha Sheffield MD   On: 04/23/2020 12:43   CT Cervical Spine Wo Contrast  Result Date: 04/23/2020 CLINICAL DATA:  Un witnessed fall. EXAM: CT HEAD WITHOUT CONTRAST CT MAXILLOFACIAL WITHOUT CONTRAST CT CERVICAL SPINE WITHOUT CONTRAST TECHNIQUE: Multidetector CT imaging of the head, cervical spine, and maxillofacial structures were performed using the standard protocol without intravenous contrast. Multiplanar CT image reconstructions of the cervical spine and maxillofacial structures were also generated. COMPARISON:  CT cervical spine  from 12/03/2014. CT head December 13, 2018. FINDINGS: CT HEAD FINDINGS Brain: Small volume of acute subarachnoid hemorrhage along the right frontal convexity (for example see series 4, image 22). Some hemorrhage layering within the anterior aspect of the right sylvian fissure and overlying the anterior right temporal convexity (series 6, image 12; series 4, image 14). No definite acute intraparenchymal hemorrhage. 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 Similar additional patchy white matter hypodensities, most  likely related to chronic microvascular ischemic disease. No evidence of acute large vascular territory infarct. No hydrocephalus. No evidence of intraventricular hemorrhage. No mass lesion or abnormal mass effect. Vascular: Calcific atherosclerosis. No hyperdense vessel identified. Skull: No acute fracture. Other: No mastoid effusions. CT MAXILLOFACIAL FINDINGS Osseous: No fracture or mandibular dislocation. No destructive process. Chronic right posterior zygomatic cerclage wire. Orbits: No retro bulbar hematoma. Globes are symmetric and within normal limits. No proptosis. Sinuses: Chronic hypoplastic right maxillary sinus. The sinuses are largely clear. No air-fluid levels. Soft tissues: Right periorbital/malar soft tissue contusion. CT CERVICAL SPINE FINDINGS Alignment: Similar alignment. Mild levocurvature. No substantial sagittal subluxation. Skull base and vertebrae: Vertebral body heights are maintained. No evidence of acute fracture. Similar degenerative endplate Schmorl's node involving the superior C4 endplate. Soft tissues and spinal canal: No prevertebral fluid or swelling. No visible canal hematoma. Disc levels: Mild to moderate multilevel degenerative disc disease with disc height loss and endplate spurring. No evidence of significant bony canal stenosis. Upper chest: Biapical pleuroparenchymal scarring. Otherwise, visualized lung apices are clear. IMPRESSION: CT head: 1. Small volume of acute right frontotemporal subarachnoid hemorrhage, as detailed above. No mass effect. 2. Remote right subinsular and right frontal infarcts and chronic microvascular ischemic disease. CT maxillofacial 1. No acute fracture. 2. Right periorbital/malar contusion. CT cervical spine: 1. No evidence of acute fracture or traumatic malalignment. 2. Similar large left thyroid lesion, which was biopsied on Jun 18, 2013. Findings discussed with Dr. Tomi Bamberger via telephone at 12:40 PM. Electronically Signed   By: Margaretha Sheffield MD    On: 04/23/2020 12:43   DG Chest Portable 1 View  Result Date: 04/23/2020 CLINICAL DATA:  Fall. EXAM: PORTABLE CHEST 1 VIEW COMPARISON:  Chest x-ray dated December 16, 2019. FINDINGS: The heart is at the upper limits of normal in size. Normal pulmonary vascularity. No focal consolidation, pleural effusion, or pneumothorax. No acute osseous abnormality. IMPRESSION: No active disease. Electronically Signed   By: Titus Dubin M.D.   On: 04/23/2020 12:00   DG Shoulder Left  Result Date: 04/23/2020 CLINICAL DATA:  Left shoulder pain after fall. EXAM: LEFT SHOULDER - 2+ VIEW COMPARISON:  Left shoulder x-rays dated Jul 11, 2019. FINDINGS: No acute fracture or dislocation. Unchanged mild acromioclavicular osteoarthritis. Soft tissues are unremarkable. IMPRESSION: 1. No acute osseous abnormality. Electronically Signed   By: Titus Dubin M.D.   On: 04/23/2020 11:57   DG Hip Unilat W or Wo Pelvis 2-3 Views Left  Result Date: 04/23/2020 CLINICAL DATA:  Left hip pain after fall. EXAM: DG HIP (WITH OR WITHOUT PELVIS) 2-3V LEFT COMPARISON:  Left hip x-rays dated April 16, 2020. FINDINGS: No acute fracture or dislocation. Unchanged mild bilateral hip osteoarthritis. Osteopenia. Soft tissues are unremarkable. IMPRESSION: 1. No acute osseous abnormality. Electronically Signed   By: Titus Dubin M.D.   On: 04/23/2020 11:58   CT Maxillofacial Wo Contrast  Result Date: 04/23/2020 CLINICAL DATA:  Un witnessed fall. EXAM: CT HEAD WITHOUT CONTRAST CT MAXILLOFACIAL WITHOUT CONTRAST CT CERVICAL SPINE WITHOUT CONTRAST TECHNIQUE:  Multidetector CT imaging of the head, cervical spine, and maxillofacial structures were performed using the standard protocol without intravenous contrast. Multiplanar CT image reconstructions of the cervical spine and maxillofacial structures were also generated. COMPARISON:  CT cervical spine from 12/03/2014. CT head December 13, 2018. FINDINGS: CT HEAD FINDINGS Brain: Small volume of acute  subarachnoid hemorrhage along the right frontal convexity (for example see series 4, image 22). Some hemorrhage layering within the anterior aspect of the right sylvian fissure and overlying the anterior right temporal convexity (series 6, image 12; series 4, image 14). No definite acute intraparenchymal hemorrhage. 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 Similar additional patchy white matter hypodensities, most likely related to chronic microvascular ischemic disease. No evidence of acute large vascular territory infarct. No hydrocephalus. No evidence of intraventricular hemorrhage. No mass lesion or abnormal mass effect. Vascular: Calcific atherosclerosis. No hyperdense vessel identified. Skull: No acute fracture. Other: No mastoid effusions. CT MAXILLOFACIAL FINDINGS Osseous: No fracture or mandibular dislocation. No destructive process. Chronic right posterior zygomatic cerclage wire. Orbits: No retro bulbar hematoma. Globes are symmetric and within normal limits. No proptosis. Sinuses: Chronic hypoplastic right maxillary sinus. The sinuses are largely clear. No air-fluid levels. Soft tissues: Right periorbital/malar soft tissue contusion. CT CERVICAL SPINE FINDINGS Alignment: Similar alignment. Mild levocurvature. No substantial sagittal subluxation. Skull base and vertebrae: Vertebral body heights are maintained. No evidence of acute fracture. Similar degenerative endplate Schmorl's node involving the superior C4 endplate. Soft tissues and spinal canal: No prevertebral fluid or swelling. No visible canal hematoma. Disc levels: Mild to moderate multilevel degenerative disc disease with disc height loss and endplate spurring. No evidence of significant bony canal stenosis. Upper chest: Biapical pleuroparenchymal scarring. Otherwise, visualized lung apices are clear. IMPRESSION: CT head: 1. Small volume of acute right frontotemporal subarachnoid hemorrhage, as detailed  above. No mass effect. 2. Remote right subinsular and right frontal infarcts and chronic microvascular ischemic disease. CT maxillofacial 1. No acute fracture. 2. Right periorbital/malar contusion. CT cervical spine: 1. No evidence of acute fracture or traumatic malalignment. 2. Similar large left thyroid lesion, which was biopsied on Jun 18, 2013. Findings discussed with Dr. Tomi Bamberger via telephone at 12:40 PM. Electronically Signed   By: Margaretha Sheffield MD   On: 04/23/2020 12:43    Procedures Procedures   Medications Ordered in ED Medications - No data to display  ED Course  I have reviewed the triage vital signs and the nursing notes.  Pertinent labs & imaging results that were available during my care of the patient were reviewed by me and considered in my medical decision making (see chart for details).  Clinical Course as of 04/23/20 1344  Fri Apr 23, 2020  1240 Small hemorrhage noted on CT [JK]  1305 Plain x-rays without acute fracture. Facial and C-spine CT without acute injuries [JK]  1326 Discussed with Dr Saintclair Halsted.  Reviewed the films.  No treatment necessary.  No need for repeat imaging [JK]    Clinical Course User Index [JK] Dorie Rank, MD   MDM Rules/Calculators/A&P                          Patient presented to the ED for evaluation after mechanical fall at home.  Patient did recently injure her ankle.  She has had some difficulty walking with that.  Patient has significant facial contusion noted.  CT scans and x-rays were performed.  No acute fractures were noted however the  patient does have a small subarachnoid hemorrhage.  Discussed the case with Dr. Saintclair Halsted, neurosurgery, no treatment is indicated for that hemorrhage and he does not feel that repeat imaging is necessary.  Patient lives at home with a caregiver.  She is having some difficulty ambulating with her recent injuries.  Think it be reasonable to bring her into the hospital for monitoring, possible pt, ot evaluation  assistance.  Final Clinical Impression(s) / ED Diagnoses Final diagnoses:  Subarachnoid hemorrhage ([place])  Contusion of face, initial encounter      Dorie Rank, MD 04/23/20 1344

## 2020-04-23 NOTE — H&P (Signed)
History and Physical        Hospital Admission Note Date: 04/23/2020  Patient name: Ariana [name] Medical record number: [id number] Date of birth: 1929-08-26 Age: 87 y.o. Gender: female  PCP: Gayland Curry, DO  Patient coming from: home Lives with: alone but with caregiver during the day At baseline, ambulates: walker  Chief Complaint    Chief Complaint  Patient presents with  . Fall  . Eye Pain  . Shoulder Injury  . Hip Pain  . Ankle Pain      HPI:   This is an 87 year old female with a past medical history of dementia, recurrent falls, depression, diabetes, paroxysmal atrial fibrillation, Sjogren's syndrome, hypertension, recent left ankle fracture and placed in a short leg splint who presented to the ED with recurrent fall at home.  She was recently seen by orthopedic surgery on 3/8 regarding her left minimally displaced distal fibula fracture and was placed in a cam walker boot advised to refrain from weightbearing as much as possible and was given a prescription for hydrocodone as needed for pain and advised to follow-up in 2 weeks.  Today she tried to get up and go to the bathroom and fell and hit her face and has pain in her shoulder, left hip and left ankle. Did not have LOC. Unwitnessed fall.    ED Course: Afebrile, hemodynamically stable, on room air. Notable Labs: Glucose 137, otherwise labs unremarkable. Notable Imaging: CT head-small volume acute right frontotemporal subarachnoid hemorrhage and remote right subinsular and right frontal infarcts with chronic microvascular ischemic disease.  Otherwise, XR of her left ankle, shoulder, hip, chest unremarkable. ED provider discussed with neurosurgery who did not recommend any further treatment or evaluation of her SAH.    Vitals:   04/23/20 1345 04/23/20 1400  BP: (!) 142/73 129/85  Pulse: 60 (!) 52  Resp: (!) 21  13  Temp:    SpO2: 94% 96%     Review of Systems:  Review of Systems  All other systems reviewed and are negative.   Medical/Social/Family History   Past Medical History: Past Medical History:  Diagnosis Date  . Abnormality of gait   . Adenomatous polyp of colon 2002   71mm  . Allergic rhinitis   . Anxiety   . Anxiety and depression   . Chronic back pain   . Dementia without behavioral disturbance ([place])   . Depression   . Diabetes ([place])   . Diverticulosis of colon   . Dry eye syndrome   . Dysphagia   . Dysthymic disorder   . Fall   . Fibromyalgia   . GERD (gastroesophageal reflux disease)   . H/O hiatal hernia   . History of adverse drug reaction   . History of cerebrovascular disease 09/24/2014  . History of recurrent UTIs   . Hypertension, benign   . Irritable bowel syndrome   . Low back pain syndrome   . Memory loss   . Mitral valve prolapse   . Paroxysmal A-fib ([place])   . Peripheral neuropathy    "both feet and legs"  . Physical deconditioning   . Sjogren's syndrome ([place])   . Therapeutic opioid-induced constipation (OIC)   . Thyroid nodule   .  Urinary incontinence     Past Surgical History:  Procedure Laterality Date  . ABDOMINAL HYSTERECTOMY  1967  . APPENDECTOMY    . CARDIAC CATHETERIZATION  02/17/2003   normal L main, LAD free of disease, Cfx free of disease, RCA free of disease (Dr. Loni Muse. [place])  . CATARACT EXTRACTION, BILATERAL    . CHOLECYSTECTOMY  2000  . COLONOSCOPY W/ BIOPSIES     multiple  . DENTAL SURGERY     multiple tooth extractions  . ESOPHAGOGASTRODUODENOSCOPY (EGD) WITH ESOPHAGEAL DILATION N/A 08/23/2012   Procedure: ESOPHAGOGASTRODUODENOSCOPY (EGD) WITH ESOPHAGEAL DILATION;  Surgeon: Milus Banister, MD;  Location: [facility];  Service: Endoscopy;  Laterality: N/A;  . NASAL SEPTUM SURGERY  1980  . NM MYOCAR PERF WALL MOTION  2003   persantine - normal static and dynamic study w/apical thinning and presvered LV function, no ischemia   . SINUS EXPLORATION     ossifiying fibroma  . TEMPOROMANDIBULAR JOINT SURGERY  1986   Dr. Terence Lux  . TRANSTHORACIC ECHOCARDIOGRAM  2001   mild LVH, normal LV    Medications: Prior to Admission medications   Medication Sig Start Date End Date Taking? Authorizing Provider  acetaminophen (TYLENOL) 500 MG tablet Take 1,000 mg by mouth 2 (two) times daily.    [provider]  ALPRAZolam Duanne Moron) 0.25 MG tablet Take 1 tablet by mouth twice daily as needed 04/02/20   Mariea Clonts, Tiffany L, DO  antiseptic oral rinse (BIOTENE) LIQD 15 mLs by Mouth Rinse route 5 (five) times daily as needed for dry mouth.    [provider]  aspirin 325 MG tablet Take 1 tablet (325 mg total) by mouth daily. 07/28/14   Reyne Dumas, MD  Capsaicin-Menthol-Methyl Sal (CAPSAICIN-METHYL SAL-MENTHOL) 0.025-1-12 % CREA Apply 1 application topically 2 (two) times daily. 09/11/18   Ngetich, Dinah C, NP  carboxymethylcellulose (REFRESH PLUS) 0.5 % SOLN Place 1 drop into both eyes 5 (five) times daily as needed (dry eyes).    [provider]  CRANBERRY PO Take 2 tablets by mouth daily.    [provider]  diltiazem (CARDIZEM CD) 180 MG 24 hr capsule TAKE 1 CAPSULE BY MOUTH ONCE DAILY 06/09/19   Hilty, Nadean Corwin, MD  DULoxetine (CYMBALTA) 30 MG capsule Take 1 capsule by mouth daly for back pain and depression in combination with 60 mg tablet 01/29/20   Reed, Tiffany L, DO  DULoxetine (CYMBALTA) 60 MG capsule Take 1 capsule by mouth once daily 01/29/20   Reed, Tiffany L, DO  furosemide (LASIX) 40 MG tablet Take 1 tablet by mouth once daily 02/10/20   Reed, Tiffany L, DO  glucose blood (ONETOUCH VERIO) test strip Use to test blood sugar daily. Dx:E08.65 10/29/19   Reed, Tiffany L, DO  HYDROcodone-acetaminophen (NORCO/VICODIN) 5-325 MG tablet Take 1 tablet by mouth every 8 (eight) hours as needed for moderate pain. 04/20/20   Persons, Bevely Palmer, [place]  JANUVIA 100 MG tablet Take 1 tablet by mouth once daily  04/02/20   Reed, Tiffany L, DO  Lancets Micro Thin 33G MISC Use to test blood sugar daily. Dx: E08.65 10/29/19   Reed, Tiffany L, DO  lansoprazole (PREVACID) 30 MG capsule TAKE 1 CAPSULE BY MOUTH ONCE DAILY AT NOON 03/15/20   Reed, Tiffany L, DO  LINZESS 290 MCG CAPS capsule TAKE 1 CAPSULE BY MOUTH EVERY DAY AT BEDTIME 03/25/20   Gatha Mayer, MD  loratadine (CLARITIN) 10 MG tablet Take 10 mg by mouth daily.    [provider]  memantine (NAMENDA) 10 MG tablet Take 1 tablet by mouth twice daily 02/26/20   Mariea Clonts, Tiffany L, DO  MYRBETRIQ 50 MG TB24 tablet Take 1 tablet by mouth once daily 04/12/20   Mariea Clonts, Tiffany L, DO  pilocarpine (SALAGEN) 5 MG tablet Take 1 tablet by mouth twice daily 02/02/20   Bo Merino, MD  polyethylene glycol (MIRALAX / GLYCOLAX) packet Take by mouth daily as needed (constipation). Mix 1/2 capful of miralax in 8 oz liquid and drink    [provider]  potassium chloride SA (KLOR-CON) 20 MEQ tablet Take 1 tablet by mouth once daily 01/29/20   Reed, Tiffany L, DO  pregabalin (LYRICA) 100 MG capsule Take 1 capsule by mouth twice daily 04/02/20   Reed, Tiffany L, DO  senna-docusate (SENNA S) 8.6-50 MG tablet Take 1 tablet by mouth daily. 04/01/20   Reed, Tiffany L, DO    Allergies:   Allergies  Allergen Reactions  . Banana Nausea And Vomiting  . Codeine Nausea Only    unless given with Phenergan  . Klonopin [Clonazepam] Other (See Comments)    Causes hallucination   . Meperidine Hcl Nausea Only    unless given with Phenergan  . Norflex [Orphenadrine Citrate] Nausea Only    Unless given with Phenergan  . Oxycodone-Acetaminophen Nausea Only    unless given with phenergan  . Propoxyphene Hcl Nausea Only    unless given with phenergan  . Zoloft [Sertraline Hcl] Other (See Comments)    Caused lethargy  . Doxycycline Other (See Comments)    Unknown reaction  . Naproxen Other (See Comments)    Unknown reaction  . Penicillins Other (See Comments)     Unknown reaction Has patient had a PCN reaction causing immediate rash, facial/tongue/throat swelling, SOB or lightheadedness with hypotension: Unknown Has patient had a PCN reaction causing severe rash involving mucus membranes or skin necrosis: Unknown Has patient had a PCN reaction that required hospitalization: pt was in the hospital at time of reaction Has patient had a PCN reaction occurring within the last 10 years: Unknown If all of the above answers are "NO", then may proceed with Cephalos  . Phenothiazines Other (See Comments)    Unknown reaction  . Stelazine Other (See Comments)    Unknown reaction  . Sulfamethoxazole-Trimethoprim Other (See Comments)    Unknown reaction  . Tolectin [Tolmetin Sodium] Other (See Comments)    Unknown reaction  . Tramadol Other (See Comments)    Unknown reaction    Social History:  reports that she has quit smoking. She has never used smokeless tobacco. She reports that she does not drink alcohol and does not use drugs.  Family History: Family History  Problem Relation Age of Onset  . Heart disease Father        heart attack  . Pneumonia Father   . Heart attack Mother   . Hypertension Mother   . Colon cancer Sister   . Kidney disease Daughter   . Asthma Daughter   . Arthritis Daughter 62       osteo,  . Heart disease Son 20       stage 3 CHF(Diastolic /Systolic)  . Throat cancer Brother   . Hypertension Maternal Grandmother      Objective   Physical Exam: Blood pressure 129/85, pulse (!) 52, temperature 97.6 F (36.4 C), temperature source Oral, resp. rate 13, height 5\' 3"  (1.6 m), weight 68 kg, SpO2 96 %.  Physical Exam Vitals and nursing note  reviewed. Exam conducted with a chaperone present.  Constitutional:      General: She is not in acute distress. HENT:     Head:   Cardiovascular:     Rate and Rhythm: Normal rate and regular rhythm.  Pulmonary:     Effort: Pulmonary effort is normal. No respiratory distress.   Abdominal:     General: Abdomen is flat. There is no distension.  Musculoskeletal:     Comments: LLE edema, mild erythema   Neurological:     Mental Status: She is alert. Mental status is at baseline.  Psychiatric:        Mood and Affect: Mood normal.        Behavior: Behavior normal.     LABS on Admission: I have personally reviewed all the labs and imaging below    Basic Metabolic Panel: Recent Labs  Lab 04/23/20 1051  NA 139  K 4.0  CL 101  CO2 27  GLUCOSE 137*  BUN 18  CREATININE 0.56  CALCIUM 9.8   Liver Function Tests: No results for input(s): AST, ALT, ALKPHOS, BILITOT, PROT, ALBUMIN in the last 168 hours. No results for input(s): LIPASE, AMYLASE in the last 168 hours. No results for input(s): AMMONIA in the last 168 hours. CBC: Recent Labs  Lab 04/23/20 1051  WBC 6.5  HGB 12.2  HCT 37.4  MCV 96.4  PLT 218   Cardiac Enzymes: No results for input(s): CKTOTAL, CKMB, CKMBINDEX, TROPONINI in the last 168 hours. BNP: Invalid input(s): POCBNP CBG: No results for input(s): GLUCAP in the last 168 hours.  Radiological Exams on Admission:  DG Ankle Complete Left  Result Date: 04/23/2020 CLINICAL DATA:  Left ankle pain after fall. EXAM: LEFT ANKLE COMPLETE - 3+ VIEW COMPARISON:  Left ankle x-rays dated April 16, 2020. FINDINGS: Acute nondisplaced fracture of the distal fibula again noted. No new fracture. No dislocation. The ankle mortise is symmetric. The talar dome is intact. Joint spaces are preserved. Osteopenia. Decreasing soft tissue swelling over the lateral malleolus. IMPRESSION: 1. Unchanged acute nondisplaced distal fibular fracture. No new fracture. Electronically Signed   By: Titus Dubin M.D.   On: 04/23/2020 11:56   CT Head Wo Contrast  Result Date: 04/23/2020 CLINICAL DATA:  Un witnessed fall. EXAM: CT HEAD WITHOUT CONTRAST CT MAXILLOFACIAL WITHOUT CONTRAST CT CERVICAL SPINE WITHOUT CONTRAST TECHNIQUE: Multidetector CT imaging of the head,  cervical spine, and maxillofacial structures were performed using the standard protocol without intravenous contrast. Multiplanar CT image reconstructions of the cervical spine and maxillofacial structures were also generated. COMPARISON:  CT cervical spine from 12/03/2014. CT head December 13, 2018. FINDINGS: CT HEAD FINDINGS Brain: Small volume of acute subarachnoid hemorrhage along the right frontal convexity (for example see series 4, image 22). Some hemorrhage layering within the anterior aspect of the right sylvian fissure and overlying the anterior right temporal convexity (series 6, image 12; series 4, image 14). No definite acute intraparenchymal hemorrhage. 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 Similar additional patchy white matter hypodensities, most likely related to chronic microvascular ischemic disease. No evidence of acute large vascular territory infarct. No hydrocephalus. No evidence of intraventricular hemorrhage. No mass lesion or abnormal mass effect. Vascular: Calcific atherosclerosis. No hyperdense vessel identified. Skull: No acute fracture. Other: No mastoid effusions. CT MAXILLOFACIAL FINDINGS Osseous: No fracture or mandibular dislocation. No destructive process. Chronic right posterior zygomatic cerclage wire. Orbits: No retro bulbar hematoma. Globes are symmetric and within normal limits. No proptosis. Sinuses: Chronic  hypoplastic right maxillary sinus. The sinuses are largely clear. No air-fluid levels. Soft tissues: Right periorbital/malar soft tissue contusion. CT CERVICAL SPINE FINDINGS Alignment: Similar alignment. Mild levocurvature. No substantial sagittal subluxation. Skull base and vertebrae: Vertebral body heights are maintained. No evidence of acute fracture. Similar degenerative endplate Schmorl's node involving the superior C4 endplate. Soft tissues and spinal canal: No prevertebral fluid or swelling. No visible canal hematoma.  Disc levels: Mild to moderate multilevel degenerative disc disease with disc height loss and endplate spurring. No evidence of significant bony canal stenosis. Upper chest: Biapical pleuroparenchymal scarring. Otherwise, visualized lung apices are clear. IMPRESSION: CT head: 1. Small volume of acute right frontotemporal subarachnoid hemorrhage, as detailed above. No mass effect. 2. Remote right subinsular and right frontal infarcts and chronic microvascular ischemic disease. CT maxillofacial 1. No acute fracture. 2. Right periorbital/malar contusion. CT cervical spine: 1. No evidence of acute fracture or traumatic malalignment. 2. Similar large left thyroid lesion, which was biopsied on Jun 18, 2013. Findings discussed with Dr. Tomi Bamberger via telephone at 12:40 PM. Electronically Signed   By: Margaretha Sheffield MD   On: 04/23/2020 12:43   CT Cervical Spine Wo Contrast  Result Date: 04/23/2020 CLINICAL DATA:  Un witnessed fall. EXAM: CT HEAD WITHOUT CONTRAST CT MAXILLOFACIAL WITHOUT CONTRAST CT CERVICAL SPINE WITHOUT CONTRAST TECHNIQUE: Multidetector CT imaging of the head, cervical spine, and maxillofacial structures were performed using the standard protocol without intravenous contrast. Multiplanar CT image reconstructions of the cervical spine and maxillofacial structures were also generated. COMPARISON:  CT cervical spine from 12/03/2014. CT head December 13, 2018. FINDINGS: CT HEAD FINDINGS Brain: Small volume of acute subarachnoid hemorrhage along the right frontal convexity (for example see series 4, image 22). Some hemorrhage layering within the anterior aspect of the right sylvian fissure and overlying the anterior right temporal convexity (series 6, image 12; series 4, image 14). No definite acute intraparenchymal hemorrhage. 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 Similar additional patchy white matter hypodensities, most likely related to chronic microvascular  ischemic disease. No evidence of acute large vascular territory infarct. No hydrocephalus. No evidence of intraventricular hemorrhage. No mass lesion or abnormal mass effect. Vascular: Calcific atherosclerosis. No hyperdense vessel identified. Skull: No acute fracture. Other: No mastoid effusions. CT MAXILLOFACIAL FINDINGS Osseous: No fracture or mandibular dislocation. No destructive process. Chronic right posterior zygomatic cerclage wire. Orbits: No retro bulbar hematoma. Globes are symmetric and within normal limits. No proptosis. Sinuses: Chronic hypoplastic right maxillary sinus. The sinuses are largely clear. No air-fluid levels. Soft tissues: Right periorbital/malar soft tissue contusion. CT CERVICAL SPINE FINDINGS Alignment: Similar alignment. Mild levocurvature. No substantial sagittal subluxation. Skull base and vertebrae: Vertebral body heights are maintained. No evidence of acute fracture. Similar degenerative endplate Schmorl's node involving the superior C4 endplate. Soft tissues and spinal canal: No prevertebral fluid or swelling. No visible canal hematoma. Disc levels: Mild to moderate multilevel degenerative disc disease with disc height loss and endplate spurring. No evidence of significant bony canal stenosis. Upper chest: Biapical pleuroparenchymal scarring. Otherwise, visualized lung apices are clear. IMPRESSION: CT head: 1. Small volume of acute right frontotemporal subarachnoid hemorrhage, as detailed above. No mass effect. 2. Remote right subinsular and right frontal infarcts and chronic microvascular ischemic disease. CT maxillofacial 1. No acute fracture. 2. Right periorbital/malar contusion. CT cervical spine: 1. No evidence of acute fracture or traumatic malalignment. 2. Similar large left thyroid lesion, which was biopsied on Jun 18, 2013. Findings discussed with Dr. Tomi Bamberger via  telephone at 12:40 PM. Electronically Signed   By: Margaretha Sheffield MD   On: 04/23/2020 12:43   DG Chest  Portable 1 View  Result Date: 04/23/2020 CLINICAL DATA:  Fall. EXAM: PORTABLE CHEST 1 VIEW COMPARISON:  Chest x-ray dated December 16, 2019. FINDINGS: The heart is at the upper limits of normal in size. Normal pulmonary vascularity. No focal consolidation, pleural effusion, or pneumothorax. No acute osseous abnormality. IMPRESSION: No active disease. Electronically Signed   By: Titus Dubin M.D.   On: 04/23/2020 12:00   DG Shoulder Left  Result Date: 04/23/2020 CLINICAL DATA:  Left shoulder pain after fall. EXAM: LEFT SHOULDER - 2+ VIEW COMPARISON:  Left shoulder x-rays dated Jul 11, 2019. FINDINGS: No acute fracture or dislocation. Unchanged mild acromioclavicular osteoarthritis. Soft tissues are unremarkable. IMPRESSION: 1. No acute osseous abnormality. Electronically Signed   By: Titus Dubin M.D.   On: 04/23/2020 11:57   DG Hip Unilat W or Wo Pelvis 2-3 Views Left  Result Date: 04/23/2020 CLINICAL DATA:  Left hip pain after fall. EXAM: DG HIP (WITH OR WITHOUT PELVIS) 2-3V LEFT COMPARISON:  Left hip x-rays dated April 16, 2020. FINDINGS: No acute fracture or dislocation. Unchanged mild bilateral hip osteoarthritis. Osteopenia. Soft tissues are unremarkable. IMPRESSION: 1. No acute osseous abnormality. Electronically Signed   By: Titus Dubin M.D.   On: 04/23/2020 11:58   CT Maxillofacial Wo Contrast  Result Date: 04/23/2020 CLINICAL DATA:  Un witnessed fall. EXAM: CT HEAD WITHOUT CONTRAST CT MAXILLOFACIAL WITHOUT CONTRAST CT CERVICAL SPINE WITHOUT CONTRAST TECHNIQUE: Multidetector CT imaging of the head, cervical spine, and maxillofacial structures were performed using the standard protocol without intravenous contrast. Multiplanar CT image reconstructions of the cervical spine and maxillofacial structures were also generated. COMPARISON:  CT cervical spine from 12/03/2014. CT head December 13, 2018. FINDINGS: CT HEAD FINDINGS Brain: Small volume of acute subarachnoid hemorrhage along the  right frontal convexity (for example see series 4, image 22). Some hemorrhage layering within the anterior aspect of the right sylvian fissure and overlying the anterior right temporal convexity (series 6, image 12; series 4, image 14). No definite acute intraparenchymal hemorrhage. 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 Similar additional patchy white matter hypodensities, most likely related to chronic microvascular ischemic disease. No evidence of acute large vascular territory infarct. No hydrocephalus. No evidence of intraventricular hemorrhage. No mass lesion or abnormal mass effect. Vascular: Calcific atherosclerosis. No hyperdense vessel identified. Skull: No acute fracture. Other: No mastoid effusions. CT MAXILLOFACIAL FINDINGS Osseous: No fracture or mandibular dislocation. No destructive process. Chronic right posterior zygomatic cerclage wire. Orbits: No retro bulbar hematoma. Globes are symmetric and within normal limits. No proptosis. Sinuses: Chronic hypoplastic right maxillary sinus. The sinuses are largely clear. No air-fluid levels. Soft tissues: Right periorbital/malar soft tissue contusion. CT CERVICAL SPINE FINDINGS Alignment: Similar alignment. Mild levocurvature. No substantial sagittal subluxation. Skull base and vertebrae: Vertebral body heights are maintained. No evidence of acute fracture. Similar degenerative endplate Schmorl's node involving the superior C4 endplate. Soft tissues and spinal canal: No prevertebral fluid or swelling. No visible canal hematoma. Disc levels: Mild to moderate multilevel degenerative disc disease with disc height loss and endplate spurring. No evidence of significant bony canal stenosis. Upper chest: Biapical pleuroparenchymal scarring. Otherwise, visualized lung apices are clear. IMPRESSION: CT head: 1. Small volume of acute right frontotemporal subarachnoid hemorrhage, as detailed above. No mass effect. 2. Remote  right subinsular and right frontal infarcts and chronic microvascular ischemic disease. CT  maxillofacial 1. No acute fracture. 2. Right periorbital/malar contusion. CT cervical spine: 1. No evidence of acute fracture or traumatic malalignment. 2. Similar large left thyroid lesion, which was biopsied on Jun 18, 2013. Findings discussed with Dr. Tomi Bamberger via telephone at 12:40 PM. Electronically Signed   By: Margaretha Sheffield MD   On: 04/23/2020 12:43      EKG: normal sinus rhythm, nonspecific ST and T waves changes   A & P   Principal Problem:   Fall Active Problems:   Dementia without behavioral disturbance ([place])   A-fib ([place])   Diabetes mellitus due to underlying condition, uncontrolled ([place])   Subarachnoid hemorrhage ([place])   1. Recurrent fall a. I suspect the patient stood onto her left ankle (which is fractured) when getting out of bed and her leg gave out due to the pain. No syncope b. Possibly a component of polypharmacy: xanax, new hydrocodone prescription, cymbalta, etc.  c. Hold Xanax and hydrocodone d. She will likely need SNF -> PT eval  2. SAH likely secondary to fall a. ED provider discussed with on call neurosurgeon who did not recommend any further treatment or workup b. Hold aspirin  3. LLE edema in the setting of recent left ankle fracture a. LLE [place] to rule out DVT  4. Minimally displaced left distal fibula fracture a. Occurred last week b. Continue cam walker boot per outpatient ortho recs. She does not need to wear at night. Elevate leg to decrease swelling and refrain from weight bearing as much as possible.  5. Paroxysmal atrial fibrillation a. Currently in sinus b. Holding aspirin as above c. Holding cardizem as her HR is borderline bradycardic  6. IBS a. Continue home linzess and as needed miralax  7. Dementia a. Continue namenda  8. Diabetes a. Changing home Januvia to [place] due to formulary      DVT prophylaxis: SCDs   Code Status: Full  Code  Diet: heart healthy Family Communication: Admission, patients condition and plan of care including tests being ordered have been discussed with the patient who indicates understanding and agrees with the plan and Code Status. Patient's caregiver, daughter was updated  Disposition Plan: The appropriate patient status for this patient is OBSERVATION. Observation status is judged to be reasonable and necessary in order to provide the required intensity of service to ensure the patient's safety. The patient's presenting symptoms, physical exam findings, and initial radiographic and laboratory data in the context of their medical condition is felt to place them at decreased risk for further clinical deterioration. Furthermore, it is anticipated that the patient will be medically stable for discharge from the hospital within 2 midnights of admission. The following factors support the patient status of observation.   " The patient's presenting symptoms include fall. " The physical exam findings include right eye ecchymosis. 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     Consultants  . none  Procedures  . none  Time Spent on Admission: 55 minutes    Harold Hedge, DO [facility]  04/23/2020, 2:45 PM

## 2020-04-23 NOTE — ED Notes (Signed)
Family at bedside. 

## 2020-04-23 NOTE — ED Notes (Signed)
Patient attached to external female catheter Bed alarm on

## 2020-04-23 NOTE — Progress Notes (Signed)
Left lower extremity venous duplex has been completed. Preliminary results can be found in CV Proc through chart review.   04/23/20 2:47 PM Ariana [name] RVT

## 2020-04-23 NOTE — Telephone Encounter (Signed)
Patient daughter, Leann, called and left message on clinical intake and stated that patient is at the hospital and daughter wanted to know if patient has DNR on file. I do not see one scanned into chart and patient's Code is listed Full Code.   Tried calling daughter back. LMOM to return call.

## 2020-04-24 DIAGNOSIS — W19XXXA Unspecified fall, initial encounter: Secondary | ICD-10-CM

## 2020-04-24 DIAGNOSIS — I609 Nontraumatic subarachnoid hemorrhage, unspecified: Secondary | ICD-10-CM | POA: Diagnosis not present

## 2020-04-24 LAB — CBC
HCT: 40 % (ref 36.0–46.0)
Hemoglobin: 13.1 g/dL (ref 12.0–15.0)
MCH: 31 pg (ref 26.0–34.0)
MCHC: 32.8 g/dL (ref 30.0–36.0)
MCV: 94.8 fL (ref 80.0–100.0)
Platelets: 241 10*3/uL (ref 150–400)
RBC: 4.22 MIL/uL (ref 3.87–5.11)
RDW: 13.4 % (ref 11.5–15.5)
WBC: 7.5 10*3/uL (ref 4.0–10.5)
nRBC: 0 % (ref 0.0–0.2)

## 2020-04-24 LAB — BASIC METABOLIC PANEL
Anion gap: 11 (ref 5–15)
BUN: 21 mg/dL (ref 8–23)
CO2: 25 mmol/L (ref 22–32)
Calcium: 9.3 mg/dL (ref 8.9–10.3)
Chloride: 98 mmol/L (ref 98–111)
Creatinine, Ser: 0.85 mg/dL (ref 0.44–1.00)
GFR, Estimated: >60 mL/min (ref >60)
Glucose, Bld: 147 mg/dL — ABNORMAL HIGH (ref 70–99)
Potassium: 4 mmol/L (ref 3.5–5.1)
Sodium: 134 mmol/L — ABNORMAL LOW (ref 135–145)

## 2020-04-24 NOTE — Progress Notes (Signed)
PT Note  Patient Details Name: Ariana [name] MRN: [id number] DOB: 1929/07/07       Chart reviewed and noted pt is to have CAM boot on LLE when out of bed. Caregiver is supposed to bringing it here today, will check back once she gets here to assess pt edge of bed and possibly out of bed.   Urgent care notes that she needs to minimize Weight bearing as much as possible to decrease swelling. Pt may not be able to pivot on 1 LE so may utilize minimial weight bearing for transfer when I assess her mobility.    [name] 04/24/2020, 10:09 AM  Gatha Mayer, PT, MPT Acute Rehabilitation Services Office: [phone] Pager: [phone] 04/24/2020

## 2020-04-24 NOTE — Progress Notes (Signed)
PROGRESS NOTE    Ariana [name]  NOM:[id number] DOB: 21-Oct-1929 DOA: 04/23/2020 PCP: Gayland Curry, DO   Chief Complaint  Patient presents with  . Fall  . Eye Pain  . Shoulder Injury  . Hip Pain  . Ankle Pain  Brief Narrative: 87 year old female with history of dementia and recurrent falls depression diabetes, paroxysmal A. fib and Sjogren's syndrome hypertension recent left ankle fracture admitted to [facility] with recurrent fall at home.  Recently seen by orthopedic on 3 8 for her left minimally displaced distal fibula fracture and was placed on Cam walker boot and advised to refrain from weightbearing as much as possible and was taking hydrocodone for pain control and was to follow-up in 2 weeks. 3/11-she tried to get up and go to the bathroom and fell and hit her face and has pain in her shoulder, left hip and left ankle. Did not have LOC. Unwitnessed fall.   ED Course: Afebrile, hemodynamically stable, on room air. Notable Labs: Glucose 137, otherwise labs unremarkable. Notable Imaging: CT head-small volume acute right frontotemporal subarachnoid hemorrhage and remote right subinsular and right frontal infarcts with chronic microvascular ischemic disease.  Otherwise, XR of her left ankle, shoulder, hip, chest unremarkable. ED provider discussed with neurosurgery who did not recommend any further treatment or evaluation of her [facility] Patient was admitted with PT consultation pain control.  Subjective: Seen and examined this morning.  She is alert awake oriented to self place appears to stable with baseline dementia Denies any new complaint  Assessment & Plan:  Recurrent fall Right facial bruise Recent left ankle fracture/minimally displaced left distal fibula fracture-followed by orthopedic as outpatient on CAM Walker boot Suspect polypharmacy, comorbidities or areas contributing to her fall.  Suspect patient stood onto her left ankle when getting out of bed and her gave out due to  the pain.  No actual syncope. PT OT evaluation minimize polypharmacy.  Avoid narcotic.  She is on Lyrica.  Subarachnoid hemorrhage likely secondary to fall.  Neurosurgery was consulted by ED provider who did not recommend any further treatment or work-up or follow-up.  Will avoid aspirin Lovenox or heparin. CT head read as-" Small volume of acute right frontotemporal subarachnoid hemorrhage, as detailed above. No mass effect.Remote right subinsular and right frontal infarcts and chronic microvascular ischemic disease"  PAF in sinus rhythm holding aspirin holding Cardizem with heart rate was borderline  Dementia at baseline continue Namenda  Type 2 diabetes mellitus continue home Januvia>Trandjenta  IBS continue home linzess and prn laxatives  Diet Order            Diet Heart Room service appropriate? Yes; Fluid consistency: Thin  Diet effective now                Patient's Body mass index is 26.56 kg/m.  DVT prophylaxis: Place and maintain sequential compression device Start: 04/23/20 1446 SCDs Start: 04/23/20 1416 Code Status:   Code Status: Full Code  Family Communication: plan of care discussed with patient  nursing staffs Called her daughter and son in law no- no answer.  Status is: Observation Remains hospitalized for ongoing PT OT evaluation anticipating skilled nursing facility placement.  Dispo: The patient is from: Home              Anticipated d/c is to: TBD              Patient currently is not medically stable to d/c.   Difficult to place patient No  Unresulted  Labs (From admission, onward)          Start     Ordered   04/24/20 8315  Basic metabolic panel  Daily,   R      04/23/20 1415   04/24/20 0500  CBC  Daily,   R      04/23/20 1415          Medications reviewed:  Scheduled Meds: . DULoxetine  90 mg Oral Daily  . linaclotide  290 mcg Oral QHS  . linagliptin  5 mg Oral Daily  . memantine  10 mg Oral BID  . mirabegron ER  50 mg Oral Daily  .  pantoprazole  20 mg Oral Daily  . pregabalin  100 mg Oral BID  . sodium chloride flush  3 mL Intravenous Q12H   Continuous Infusions:  Consultants:see note  Procedures:see note  Antimicrobials: Anti-infectives (From admission, onward)   None    Culture/Microbiology Other culture-see note Objective: Vitals: Today's Vitals   04/23/20 2149 04/24/20 0535 04/24/20 1000 04/24/20 1100  BP: 125/60 132/81    Pulse: 69 76    Resp: 16 16    Temp: 97.7 F (36.5 C) 97.7 F (36.5 C)    TempSrc: Oral Oral    SpO2: 94% 90%    Weight:      Height:      PainSc:   0-No pain 0-No pain    Intake/Output Summary (Last 24 hours) at 04/24/2020 1332 Last data filed at 04/24/2020 0537 Gross per 24 hour  Intake 240 ml  Output 1000 ml  Net -760 ml   Filed Weights   04/23/20 1027  Weight: 68 kg   Weight change:   Intake/Output from previous day: 03/11 0701 - 03/12 0700 In: 240 [P.O.:240] Out: 1000 [Urine:1000] Intake/Output this shift: No intake/output data recorded. Filed Weights   04/23/20 1027  Weight: 68 kg   Examination: General exam: AAOx2,NAD, weak appearing. HEENT:Oral mucosa moist, Ear/Nose WNL grossly,dentition normal. Respiratory system: bilaterally diminished,no use of accessory muscle, non tender. Cardiovascular system: S1 & S2 +, regular, No JVD. Gastrointestinal system: Abdomen soft, NT,ND, BS+. Nervous System:Alert, awake, moving extremities and grossly nonfocal Extremities: No edema, distal peripheral pulses palpable.  Skin: No rashes,no icterus.  Right facial bruise MSK: Normal muscle bulk,tone, power  Data Reviewed: I have personally reviewed following labs and imaging studies CBC: Recent Labs  Lab 04/23/20 1051 04/24/20 0547  WBC 6.5 7.5  HGB 12.2 13.1  HCT 37.4 40.0  MCV 96.4 94.8  PLT 218 176   Basic Metabolic Panel: Recent Labs  Lab 04/23/20 1051 04/24/20 0547  NA 139 134*  K 4.0 4.0  CL 101 98  CO2 27 25  GLUCOSE 137* 147*  BUN 18 21   CREATININE 0.56 0.85  CALCIUM 9.8 9.3   GFR: Estimated Creatinine Clearance: 42.3 mL/min (by C-G formula based on SCr of 0.85 mg/dL). Liver Function Tests: No results for input(s): AST, ALT, ALKPHOS, BILITOT, PROT, ALBUMIN in the last 168 hours. No results for input(s): LIPASE, AMYLASE in the last 168 hours. No results for input(s): AMMONIA in the last 168 hours. Coagulation Profile: No results for input(s): INR, PROTIME in the last 168 hours. Cardiac Enzymes: No results for input(s): CKTOTAL, CKMB, CKMBINDEX, TROPONINI in the last 168 hours. BNP (last 3 results) No results for input(s): PROBNP in the last 8760 hours. HbA1C: No results for input(s): HGBA1C in the last 72 hours. CBG: No results for input(s): GLUCAP in the last 168 hours. Lipid  Profile: No results for input(s): CHOL, HDL, LDLCALC, TRIG, CHOLHDL, LDLDIRECT in the last 72 hours. Thyroid Function Tests: No results for input(s): TSH, T4TOTAL, FREET4, T3FREE, THYROIDAB in the last 72 hours. Anemia Panel: No results for input(s): VITAMINB12, FOLATE, FERRITIN, TIBC, IRON, RETICCTPCT in the last 72 hours. Sepsis Labs: No results for input(s): PROCALCITON, LATICACIDVEN in the last 168 hours.  Recent Results (from the past 240 hour(s))  Resp Panel by RT-PCR (Flu A&B, Covid) Nasopharyngeal Swab     Status: None   Collection Time: 04/23/20  2:10 PM   Specimen: Nasopharyngeal Swab; Nasopharyngeal(NP) swabs in vial transport medium  Result Value Ref Range Status   SARS Coronavirus 2 by RT PCR NEGATIVE NEGATIVE Final    Comment: (NOTE) SARS-CoV-2 target nucleic acids are NOT DETECTED.  The SARS-CoV-2 RNA is generally detectable in upper respiratory specimens during the acute phase of infection. The lowest concentration of SARS-CoV-2 viral copies this assay can detect is 138 copies/mL. A negative result does not preclude SARS-Cov-2 infection and should not be used as the sole basis for treatment or other patient management  decisions. A negative result may occur with  improper specimen collection/handling, submission of specimen other than nasopharyngeal swab, presence of viral mutation(s) within the areas targeted by this assay, and inadequate number of viral copies(<138 copies/mL). A negative result must be combined with clinical observations, patient history, and epidemiological information. The expected result is Negative.  Fact Sheet for Patients:  EntrepreneurPulse.com.au  Fact Sheet for Healthcare Providers:  IncredibleEmployment.be  This test is no t yet approved or cleared by the [place] FDA and  has been authorized for detection and/or diagnosis of SARS-CoV-2 by FDA under an Emergency Use Authorization (EUA). This EUA will remain  in effect (meaning this test can be used) for the duration of the COVID-19 declaration under Section 564(b)(1) of the Act, 21 U.S.C.section 360bbb-3(b)(1), unless the authorization is terminated  or revoked sooner.       Influenza A by PCR NEGATIVE NEGATIVE Final   Influenza B by PCR NEGATIVE NEGATIVE Final    Comment: (NOTE) The Xpert Xpress SARS-CoV-2/FLU/RSV plus assay is intended as an aid in the diagnosis of influenza from Nasopharyngeal swab specimens and should not be used as a sole basis for treatment. Nasal washings and aspirates are unacceptable for Xpert Xpress SARS-CoV-2/FLU/RSV testing.  Fact Sheet for Patients: EntrepreneurPulse.com.au  Fact Sheet for Healthcare Providers: IncredibleEmployment.be  This test is not yet approved or cleared by the [place] FDA and has been authorized for detection and/or diagnosis of SARS-CoV-2 by FDA under an Emergency Use Authorization (EUA). This EUA will remain in effect (meaning this test can be used) for the duration of the COVID-19 declaration under Section 564(b)(1) of the Act, 21 U.S.C. section 360bbb-3(b)(1), unless the  authorization is terminated or revoked.  Performed at [facility], [place] [address], [place], [place] [address]      Radiology Studies: DG Ankle Complete Left  Result Date: 04/23/2020 CLINICAL DATA:  Left ankle pain after fall. EXAM: LEFT ANKLE COMPLETE - 3+ VIEW COMPARISON:  Left ankle x-rays dated April 16, 2020. FINDINGS: Acute nondisplaced fracture of the distal fibula again noted. No new fracture. No dislocation. The ankle mortise is symmetric. The talar dome is intact. Joint spaces are preserved. Osteopenia. Decreasing soft tissue swelling over the lateral malleolus. IMPRESSION: 1. Unchanged acute nondisplaced distal fibular fracture. No new fracture. Electronically Signed   By: Titus Dubin M.D.   On: 04/23/2020 11:56  CT Head Wo Contrast  Result Date: 04/23/2020 CLINICAL DATA:  Un witnessed fall. EXAM: CT HEAD WITHOUT CONTRAST CT MAXILLOFACIAL WITHOUT CONTRAST CT CERVICAL SPINE WITHOUT CONTRAST TECHNIQUE: Multidetector CT imaging of the head, cervical spine, and maxillofacial structures were performed using the standard protocol without intravenous contrast. Multiplanar CT image reconstructions of the cervical spine and maxillofacial structures were also generated. COMPARISON:  CT cervical spine from 12/03/2014. CT head December 13, 2018. FINDINGS: CT HEAD FINDINGS Brain: Small volume of acute subarachnoid hemorrhage along the right frontal convexity (for example see series 4, image 22). Some hemorrhage layering within the anterior aspect of the right sylvian fissure and overlying the anterior right temporal convexity (series 6, image 12; series 4, image 14). No definite acute intraparenchymal hemorrhage. 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 Similar additional patchy white matter hypodensities, most likely related to chronic microvascular ischemic disease. No evidence of acute large vascular territory infarct. No hydrocephalus.  No evidence of intraventricular hemorrhage. No mass lesion or abnormal mass effect. Vascular: Calcific atherosclerosis. No hyperdense vessel identified. Skull: No acute fracture. Other: No mastoid effusions. CT MAXILLOFACIAL FINDINGS Osseous: No fracture or mandibular dislocation. No destructive process. Chronic right posterior zygomatic cerclage wire. Orbits: No retro bulbar hematoma. Globes are symmetric and within normal limits. No proptosis. Sinuses: Chronic hypoplastic right maxillary sinus. The sinuses are largely clear. No air-fluid levels. Soft tissues: Right periorbital/malar soft tissue contusion. CT CERVICAL SPINE FINDINGS Alignment: Similar alignment. Mild levocurvature. No substantial sagittal subluxation. Skull base and vertebrae: Vertebral body heights are maintained. No evidence of acute fracture. Similar degenerative endplate Schmorl's node involving the superior C4 endplate. Soft tissues and spinal canal: No prevertebral fluid or swelling. No visible canal hematoma. Disc levels: Mild to moderate multilevel degenerative disc disease with disc height loss and endplate spurring. No evidence of significant bony canal stenosis. Upper chest: Biapical pleuroparenchymal scarring. Otherwise, visualized lung apices are clear. IMPRESSION: CT head: 1. Small volume of acute right frontotemporal subarachnoid hemorrhage, as detailed above. No mass effect. 2. Remote right subinsular and right frontal infarcts and chronic microvascular ischemic disease. CT maxillofacial 1. No acute fracture. 2. Right periorbital/malar contusion. CT cervical spine: 1. No evidence of acute fracture or traumatic malalignment. 2. Similar large left thyroid lesion, which was biopsied on Jun 18, 2013. Findings discussed with Dr. Tomi Bamberger via telephone at 12:40 PM. Electronically Signed   By: Margaretha Sheffield MD   On: 04/23/2020 12:43   CT Cervical Spine Wo Contrast  Result Date: 04/23/2020 CLINICAL DATA:  Un witnessed fall. EXAM: CT  HEAD WITHOUT CONTRAST CT MAXILLOFACIAL WITHOUT CONTRAST CT CERVICAL SPINE WITHOUT CONTRAST TECHNIQUE: Multidetector CT imaging of the head, cervical spine, and maxillofacial structures were performed using the standard protocol without intravenous contrast. Multiplanar CT image reconstructions of the cervical spine and maxillofacial structures were also generated. COMPARISON:  CT cervical spine from 12/03/2014. CT head December 13, 2018. FINDINGS: CT HEAD FINDINGS Brain: Small volume of acute subarachnoid hemorrhage along the right frontal convexity (for example see series 4, image 22). Some hemorrhage layering within the anterior aspect of the right sylvian fissure and overlying the anterior right temporal convexity (series 6, image 12; series 4, image 14). No definite acute intraparenchymal hemorrhage. 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 Similar additional patchy white matter hypodensities, most likely related to chronic microvascular ischemic disease. No evidence of acute large vascular territory infarct. No hydrocephalus. No evidence of intraventricular hemorrhage. No mass lesion or abnormal mass effect.  Vascular: Calcific atherosclerosis. No hyperdense vessel identified. Skull: No acute fracture. Other: No mastoid effusions. CT MAXILLOFACIAL FINDINGS Osseous: No fracture or mandibular dislocation. No destructive process. Chronic right posterior zygomatic cerclage wire. Orbits: No retro bulbar hematoma. Globes are symmetric and within normal limits. No proptosis. Sinuses: Chronic hypoplastic right maxillary sinus. The sinuses are largely clear. No air-fluid levels. Soft tissues: Right periorbital/malar soft tissue contusion. CT CERVICAL SPINE FINDINGS Alignment: Similar alignment. Mild levocurvature. No substantial sagittal subluxation. Skull base and vertebrae: Vertebral body heights are maintained. No evidence of acute fracture. Similar degenerative endplate  Schmorl's node involving the superior C4 endplate. Soft tissues and spinal canal: No prevertebral fluid or swelling. No visible canal hematoma. Disc levels: Mild to moderate multilevel degenerative disc disease with disc height loss and endplate spurring. No evidence of significant bony canal stenosis. Upper chest: Biapical pleuroparenchymal scarring. Otherwise, visualized lung apices are clear. IMPRESSION: CT head: 1. Small volume of acute right frontotemporal subarachnoid hemorrhage, as detailed above. No mass effect. 2. Remote right subinsular and right frontal infarcts and chronic microvascular ischemic disease. CT maxillofacial 1. No acute fracture. 2. Right periorbital/malar contusion. CT cervical spine: 1. No evidence of acute fracture or traumatic malalignment. 2. Similar large left thyroid lesion, which was biopsied on Jun 18, 2013. Findings discussed with Dr. Tomi Bamberger via telephone at 12:40 PM. Electronically Signed   By: Margaretha Sheffield MD   On: 04/23/2020 12:43   DG Chest Portable 1 View  Result Date: 04/23/2020 CLINICAL DATA:  Fall. EXAM: PORTABLE CHEST 1 VIEW COMPARISON:  Chest x-ray dated December 16, 2019. FINDINGS: The heart is at the upper limits of normal in size. Normal pulmonary vascularity. No focal consolidation, pleural effusion, or pneumothorax. No acute osseous abnormality. IMPRESSION: No active disease. Electronically Signed   By: Titus Dubin M.D.   On: 04/23/2020 12:00   DG Shoulder Left  Result Date: 04/23/2020 CLINICAL DATA:  Left shoulder pain after fall. EXAM: LEFT SHOULDER - 2+ VIEW COMPARISON:  Left shoulder x-rays dated Jul 11, 2019. FINDINGS: No acute fracture or dislocation. Unchanged mild acromioclavicular osteoarthritis. Soft tissues are unremarkable. IMPRESSION: 1. No acute osseous abnormality. Electronically Signed   By: Titus Dubin M.D.   On: 04/23/2020 11:57   DG Hip Unilat W or Wo Pelvis 2-3 Views Left  Result Date: 04/23/2020 CLINICAL DATA:  Left hip pain  after fall. EXAM: DG HIP (WITH OR WITHOUT PELVIS) 2-3V LEFT COMPARISON:  Left hip x-rays dated April 16, 2020. FINDINGS: No acute fracture or dislocation. Unchanged mild bilateral hip osteoarthritis. Osteopenia. Soft tissues are unremarkable. IMPRESSION: 1. No acute osseous abnormality. Electronically Signed   By: Titus Dubin M.D.   On: 04/23/2020 11:58   VAS [place] LOWER EXTREMITY VENOUS (DVT)  Result Date: 04/23/2020  Lower Venous DVT Study Indications: Swelling.  Risk Factors: Trauma. Limitations: Body habitus and poor ultrasound/tissue interface. Comparison Study: No prior studies. Performing Technologist: Oliver Hum RVT  Examination Guidelines: A complete evaluation includes B-mode imaging, spectral Doppler, color Doppler, and power Doppler as needed of all accessible portions of each vessel. Bilateral testing is considered an integral part of a complete examination. Limited examinations for reoccurring indications may be performed as noted. The reflux portion of the exam is performed with the patient in reverse Trendelenburg.  +-----+---------------+---------+-----------+----------+--------------+ RIGHTCompressibilityPhasicitySpontaneityPropertiesThrombus Aging +-----+---------------+---------+-----------+----------+--------------+ CFV  Full           Yes      Yes                                 +-----+---------------+---------+-----------+----------+--------------+   +---------+---------------+---------+-----------+----------+--------------+  LEFT     CompressibilityPhasicitySpontaneityPropertiesThrombus Aging +---------+---------------+---------+-----------+----------+--------------+ CFV      Full           Yes      Yes                                 +---------+---------------+---------+-----------+----------+--------------+ SFJ      Full                                                         +---------+---------------+---------+-----------+----------+--------------+ FV Prox  Full                                                        +---------+---------------+---------+-----------+----------+--------------+ FV Mid   Full                                                        +---------+---------------+---------+-----------+----------+--------------+ FV DistalFull                                                        +---------+---------------+---------+-----------+----------+--------------+ PFV      Full                                                        +---------+---------------+---------+-----------+----------+--------------+ POP      Full           Yes      Yes                                 +---------+---------------+---------+-----------+----------+--------------+ PTV      Full                                                        +---------+---------------+---------+-----------+----------+--------------+ PERO     Full                                                        +---------+---------------+---------+-----------+----------+--------------+     Summary: RIGHT: - No evidence of common femoral vein obstruction.  LEFT: - There is no evidence of deep vein thrombosis in the lower extremity. However, portions of this examination were limited- see technologist comments above.  - No cystic structure found in  the popliteal fossa.  *See table(s) above for measurements and observations.    Preliminary    CT Maxillofacial Wo Contrast  Result Date: 04/23/2020 CLINICAL DATA:  Un witnessed fall. EXAM: CT HEAD WITHOUT CONTRAST CT MAXILLOFACIAL WITHOUT CONTRAST CT CERVICAL SPINE WITHOUT CONTRAST TECHNIQUE: Multidetector CT imaging of the head, cervical spine, and maxillofacial structures were performed using the standard protocol without intravenous contrast. Multiplanar CT image reconstructions of the cervical spine and maxillofacial structures  were also generated. COMPARISON:  CT cervical spine from 12/03/2014. CT head December 13, 2018. FINDINGS: CT HEAD FINDINGS Brain: Small volume of acute subarachnoid hemorrhage along the right frontal convexity (for example see series 4, image 22). Some hemorrhage layering within the anterior aspect of the right sylvian fissure and overlying the anterior right temporal convexity (series 6, image 12; series 4, image 14). No definite acute intraparenchymal hemorrhage. 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 Similar additional patchy white matter hypodensities, most likely related to chronic microvascular ischemic disease. No evidence of acute large vascular territory infarct. No hydrocephalus. No evidence of intraventricular hemorrhage. No mass lesion or abnormal mass effect. Vascular: Calcific atherosclerosis. No hyperdense vessel identified. Skull: No acute fracture. Other: No mastoid effusions. CT MAXILLOFACIAL FINDINGS Osseous: No fracture or mandibular dislocation. No destructive process. Chronic right posterior zygomatic cerclage wire. Orbits: No retro bulbar hematoma. Globes are symmetric and within normal limits. No proptosis. Sinuses: Chronic hypoplastic right maxillary sinus. The sinuses are largely clear. No air-fluid levels. Soft tissues: Right periorbital/malar soft tissue contusion. CT CERVICAL SPINE FINDINGS Alignment: Similar alignment. Mild levocurvature. No substantial sagittal subluxation. Skull base and vertebrae: Vertebral body heights are maintained. No evidence of acute fracture. Similar degenerative endplate Schmorl's node involving the superior C4 endplate. Soft tissues and spinal canal: No prevertebral fluid or swelling. No visible canal hematoma. Disc levels: Mild to moderate multilevel degenerative disc disease with disc height loss and endplate spurring. No evidence of significant bony canal stenosis. Upper chest: Biapical pleuroparenchymal scarring.  Otherwise, visualized lung apices are clear. IMPRESSION: CT head: 1. Small volume of acute right frontotemporal subarachnoid hemorrhage, as detailed above. No mass effect. 2. Remote right subinsular and right frontal infarcts and chronic microvascular ischemic disease. CT maxillofacial 1. No acute fracture. 2. Right periorbital/malar contusion. CT cervical spine: 1. No evidence of acute fracture or traumatic malalignment. 2. Similar large left thyroid lesion, which was biopsied on Jun 18, 2013. Findings discussed with Dr. Tomi Bamberger via telephone at 12:40 PM. Electronically Signed   By: Margaretha Sheffield MD   On: 04/23/2020 12:43     LOS: 0 days   Antonieta Pert, MD [facility]  04/24/2020, 1:32 PM

## 2020-04-24 NOTE — Progress Notes (Signed)
Orthopedic Tech Progress Note Patient Details:  Ariana [name] 1929-07-25 [id number] Patient stated she has cam walker boot at home. Patient ID: Ariana [name], female   DOB: May 21, 1929, 87 y.o.   MRN: [id number]   Braulio Bosch 04/24/2020, 10:46 AM

## 2020-04-24 NOTE — Progress Notes (Signed)
PT Cancellation Note  Patient Details Name: Ariana [name] MRN: [id number] DOB: [birth year]   Cancelled Treatment:     Stopped by room again, pt more alert and oriented this afternoon. However the CAM boot has not been delivered by caregiver/ family yet, therefor we cannot assess mobility or out of bed at this time without he CAM boot.   We will check back tomorrow.    [name] 04/24/2020, 3:33 PM

## 2020-04-25 DIAGNOSIS — W19XXXA Unspecified fall, initial encounter: Secondary | ICD-10-CM | POA: Diagnosis not present

## 2020-04-25 DIAGNOSIS — I609 Nontraumatic subarachnoid hemorrhage, unspecified: Secondary | ICD-10-CM | POA: Diagnosis not present

## 2020-04-25 LAB — BASIC METABOLIC PANEL
Anion gap: 9 (ref 5–15)
BUN: 12 mg/dL (ref 8–23)
CO2: 25 mmol/L (ref 22–32)
Calcium: 9.6 mg/dL (ref 8.9–10.3)
Chloride: 102 mmol/L (ref 98–111)
Creatinine, Ser: 0.63 mg/dL (ref 0.44–1.00)
GFR, Estimated: >60 mL/min (ref >60)
Glucose, Bld: 161 mg/dL — ABNORMAL HIGH (ref 70–99)
Potassium: 3.8 mmol/L (ref 3.5–5.1)
Sodium: 136 mmol/L (ref 135–145)

## 2020-04-25 LAB — CBC
HCT: 41 % (ref 36.0–46.0)
Hemoglobin: 13.5 g/dL (ref 12.0–15.0)
MCH: 31 pg (ref 26.0–34.0)
MCHC: 32.9 g/dL (ref 30.0–36.0)
MCV: 94 fL (ref 80.0–100.0)
Platelets: 246 10*3/uL (ref 150–400)
RBC: 4.36 MIL/uL (ref 3.87–5.11)
RDW: 13.2 % (ref 11.5–15.5)
WBC: 7 10*3/uL (ref 4.0–10.5)
nRBC: 0 % (ref 0.0–0.2)

## 2020-04-25 LAB — GLUCOSE, CAPILLARY
Glucose-Capillary: 183 mg/dL — ABNORMAL HIGH (ref 70–99)
Glucose-Capillary: 145 mg/dL — ABNORMAL HIGH (ref 70–99)
Glucose-Capillary: 149 mg/dL — ABNORMAL HIGH (ref 70–99)

## 2020-04-25 MED ORDER — INSULIN ASPART 100 UNIT/ML ~~LOC~~ SOLN
0.0000 [IU] | Freq: Three times a day (TID) | SUBCUTANEOUS | Status: DC
  Administered 2020-04-25: 2 [IU] via SUBCUTANEOUS
  Administered 2020-04-25: 1 [IU] via SUBCUTANEOUS
  Administered 2020-04-26: 3 [IU] via SUBCUTANEOUS
  Administered 2020-04-26: 1 [IU] via SUBCUTANEOUS
  Administered 2020-04-26: 2 [IU] via SUBCUTANEOUS
  Administered 2020-04-27 (×2): 1 [IU] via SUBCUTANEOUS
  Administered 2020-04-27: 2 [IU] via SUBCUTANEOUS

## 2020-04-25 NOTE — Progress Notes (Signed)
PROGRESS NOTE    Ariana [name]  TSV:[id number] DOB: 01-27-1930 DOA: 04/23/2020 PCP: Gayland Curry, DO   Chief Complaint  Patient presents with  . Fall  . Eye Pain  . Shoulder Injury  . Hip Pain  . Ankle Pain  Brief Narrative: 87 year old female with history of dementia and recurrent falls depression diabetes, paroxysmal A. fib and Sjogren's syndrome hypertension recent left ankle fracture admitted to [facility] with recurrent fall at home.  Recently seen by orthopedic on 3 8 for her left minimally displaced distal fibula fracture and was placed on Cam walker boot and advised to refrain from weightbearing as much as possible and was taking hydrocodone for pain control and was to follow-up in 2 weeks. 3/11-she tried to get up and go to the bathroom and fell and hit her face and has pain in her shoulder, left hip and left ankle. Did not have LOC. Unwitnessed fall.   ED Course: Afebrile, hemodynamically stable, on room air. Notable Labs: Glucose 137, otherwise labs unremarkable. Notable Imaging: CT head-small volume acute right frontotemporal subarachnoid hemorrhage and remote right subinsular and right frontal infarcts with chronic microvascular ischemic disease.  Otherwise, XR of her left ankle, shoulder, hip, chest unremarkable. ED provider discussed with neurosurgery who did not recommend any further treatment or evaluation of her [facility] Patient was admitted with PT consultation and for pain control.  Subjective: Seen and examined this morning Alert awake oriented to self current place or date of birth Remains afebrile. Routine labs are normal. Denies headache moving all extremities, C/O some LEFT hip pain  Assessment & Plan:  Recurrent fall Right facial bruise Recent left ankle fracture/minimally displaced left distal fibula fracture-followed by orthopedic as outpatient on CAM Walker boot Suspect polypharmacy, comorbidities old age contributing to deconditioning and fall. Suspect  patient stood onto her left ankle when getting out of bed and her gave out due to the pain.  No actual syncope.  Left hip x-ray no fracture.  She has had x-ray left ankle left shoulder chest x-ray and CT head C-spine and maxillofacial area. Awaiting for cam boot walker and further PT OT evaluation likely need placement.  Avoid narcotics sedating medication.  She is on Lyrica chronically.    Subarachnoid hemorrhage likely secondary to fall.  Neurosurgery was consulted by ED provider who did not recommend any further treatment or work-up or follow-up.  Will avoid aspirin Lovenox or heparin. CT head read as-" Small volume of acute right frontotemporal subarachnoid hemorrhage, as detailed above. No mass effect.Remote right subinsular and right frontal infarcts and chronic microvascular ischemic disease"  PAF in sinus rhythm holding aspirin due to [facility].  Holding Cardizem as HR is stable Dementia at baseline continue Namenda Type 2 diabetes mellitus continue home Januvia>Trandjenta.  Check CBG  IBS continue home linzess and prn laxatives  Diet Order            Diet Heart Room service appropriate? Yes; Fluid consistency: Thin  Diet effective now                Patient's Body mass index is 26.56 kg/m.  DVT prophylaxis: Place and maintain sequential compression device Start: 04/23/20 1446 SCDs Start: 04/23/20 1416 Code Status:   Code Status: Full Code  Family Communication: plan of care discussed with patient  Discussed with nursing staff.  Trying to reach family to obtain CAM Walker.I was unable to reach son-in-law and daughter on the phone yesterday.  Status is: Observation Remains hospitalized for ongoing  PT OT evaluation anticipating skilled nursing facility placement.  Dispo: The patient is from: Home              Anticipated d/c is to: TBD              Patient currently is not medically stable to d/c.   Difficult to place patient No  Unresulted Labs (From admission, onward)          None      Medications reviewed:  Scheduled Meds: . DULoxetine  90 mg Oral Daily  . linaclotide  290 mcg Oral QHS  . linagliptin  5 mg Oral Daily  . memantine  10 mg Oral BID  . mirabegron ER  50 mg Oral Daily  . pantoprazole  20 mg Oral Daily  . pregabalin  100 mg Oral BID  . sodium chloride flush  3 mL Intravenous Q12H   Continuous Infusions:  Consultants:see note  Procedures:see note  Antimicrobials: Anti-infectives (From admission, onward)   None    Culture/Microbiology Other culture-see note Objective: Vitals: Today's Vitals   04/24/20 1422 04/24/20 1951 04/24/20 2308 04/25/20 0648  BP: (!) 134/99  133/85 127/77  Pulse: 96  82 81  Resp: 17  18 18   Temp:   (!) 97.4 F (36.3 C) 97.7 F (36.5 C)  TempSrc:   Oral Oral  SpO2: 95%  92% 94%  Weight:      Height:      PainSc:  0-No pain      Intake/Output Summary (Last 24 hours) at 04/25/2020 0824 Last data filed at 04/25/2020 0500 Gross per 24 hour  Intake 120 ml  Output 2000 ml  Net -1880 ml   Filed Weights   04/23/20 1027  Weight: 68 kg   Weight change:   Intake/Output from previous day: 03/12 0701 - 03/13 0700 In: 120 [P.O.:120] Out: 2000 [Urine:2000] Intake/Output this shift: No intake/output data recorded. Filed Weights   04/23/20 1027  Weight: 68 kg   Examination: General exam: AAOx2, elderly,NAD, weak appearing. HEENT:Oral mucosa moist, Ear/Nose WNL grossly, dentition normal. Respiratory system: bilaterally diminishedd,no wheezing or crackles,no use of accessory muscle Cardiovascular system: S1 & S2 +, No JVD,. Gastrointestinal system: Abdomen soft, NT,ND, BS+ Nervous System:Alert, awake, moving extremities and grossly nonfocal Extremities: No edema, distal peripheral pulses palpable.  Skin: Bruise on the face no rashes,no icterus. MSK: Normal muscle bulk,tone, power.  Data Reviewed: I have personally reviewed following labs and imaging studies CBC: Recent Labs  Lab 04/23/20 1051  04/24/20 0547 04/25/20 0628  WBC 6.5 7.5 7.0  HGB 12.2 13.1 13.5  HCT 37.4 40.0 41.0  MCV 96.4 94.8 94.0  PLT 218 241 397   Basic Metabolic Panel: Recent Labs  Lab 04/23/20 1051 04/24/20 0547 04/25/20 0628  NA 139 134* 136  K 4.0 4.0 3.8  CL 101 98 102  CO2 27 25 25   GLUCOSE 137* 147* 161*  BUN 18 21 12   CREATININE 0.56 0.85 0.63  CALCIUM 9.8 9.3 9.6   GFR: Estimated Creatinine Clearance: 45 mL/min (by C-G formula based on SCr of 0.63 mg/dL). Liver Function Tests: No results for input(s): AST, ALT, ALKPHOS, BILITOT, PROT, ALBUMIN in the last 168 hours. No results for input(s): LIPASE, AMYLASE in the last 168 hours. No results for input(s): AMMONIA in the last 168 hours. Coagulation Profile: No results for input(s): INR, PROTIME in the last 168 hours. Cardiac Enzymes: No results for input(s): CKTOTAL, CKMB, CKMBINDEX, TROPONINI in the last 168 hours. BNP (last  3 results) No results for input(s): PROBNP in the last 8760 hours. HbA1C: No results for input(s): HGBA1C in the last 72 hours. CBG: No results for input(s): GLUCAP in the last 168 hours. Lipid Profile: No results for input(s): CHOL, HDL, LDLCALC, TRIG, CHOLHDL, LDLDIRECT in the last 72 hours. Thyroid Function Tests: No results for input(s): TSH, T4TOTAL, FREET4, T3FREE, THYROIDAB in the last 72 hours. Anemia Panel: No results for input(s): VITAMINB12, FOLATE, FERRITIN, TIBC, IRON, RETICCTPCT in the last 72 hours. Sepsis Labs: No results for input(s): PROCALCITON, LATICACIDVEN in the last 168 hours.  Recent Results (from the past 240 hour(s))  Resp Panel by RT-PCR (Flu A&B, Covid) Nasopharyngeal Swab     Status: None   Collection Time: 04/23/20  2:10 PM   Specimen: Nasopharyngeal Swab; Nasopharyngeal(NP) swabs in vial transport medium  Result Value Ref Range Status   SARS Coronavirus 2 by RT PCR NEGATIVE NEGATIVE Final    Comment: (NOTE) SARS-CoV-2 target nucleic acids are NOT DETECTED.  The SARS-CoV-2  RNA is generally detectable in upper respiratory specimens during the acute phase of infection. The lowest concentration of SARS-CoV-2 viral copies this assay can detect is 138 copies/mL. A negative result does not preclude SARS-Cov-2 infection and should not be used as the sole basis for treatment or other patient management decisions. A negative result may occur with  improper specimen collection/handling, submission of specimen other than nasopharyngeal swab, presence of viral mutation(s) within the areas targeted by this assay, and inadequate number of viral copies(<138 copies/mL). A negative result must be combined with clinical observations, patient history, and epidemiological information. The expected result is Negative.  Fact Sheet for Patients:  EntrepreneurPulse.com.au  Fact Sheet for Healthcare Providers:  IncredibleEmployment.be  This test is no t yet approved or cleared by the [place] FDA and  has been authorized for detection and/or diagnosis of SARS-CoV-2 by FDA under an Emergency Use Authorization (EUA). This EUA will remain  in effect (meaning this test can be used) for the duration of the COVID-19 declaration under Section 564(b)(1) of the Act, 21 U.S.C.section 360bbb-3(b)(1), unless the authorization is terminated  or revoked sooner.       Influenza A by PCR NEGATIVE NEGATIVE Final   Influenza B by PCR NEGATIVE NEGATIVE Final    Comment: (NOTE) The Xpert Xpress SARS-CoV-2/FLU/RSV plus assay is intended as an aid in the diagnosis of influenza from Nasopharyngeal swab specimens and should not be used as a sole basis for treatment. Nasal washings and aspirates are unacceptable for Xpert Xpress SARS-CoV-2/FLU/RSV testing.  Fact Sheet for Patients: EntrepreneurPulse.com.au  Fact Sheet for Healthcare Providers: IncredibleEmployment.be  This test is not yet approved or cleared by the  [place] FDA and has been authorized for detection and/or diagnosis of SARS-CoV-2 by FDA under an Emergency Use Authorization (EUA). This EUA will remain in effect (meaning this test can be used) for the duration of the COVID-19 declaration under Section 564(b)(1) of the Act, 21 U.S.C. section 360bbb-3(b)(1), unless the authorization is terminated or revoked.  Performed at [facility], [place] [address], [place], [place] [address]      Radiology Studies: DG Ankle Complete Left  Result Date: 04/23/2020 CLINICAL DATA:  Left ankle pain after fall. EXAM: LEFT ANKLE COMPLETE - 3+ VIEW COMPARISON:  Left ankle x-rays dated April 16, 2020. FINDINGS: Acute nondisplaced fracture of the distal fibula again noted. No new fracture. No dislocation. The ankle mortise is symmetric. The talar dome is intact. Joint spaces are preserved.  Osteopenia. Decreasing soft tissue swelling over the lateral malleolus. IMPRESSION: 1. Unchanged acute nondisplaced distal fibular fracture. No new fracture. Electronically Signed   By: Titus Dubin M.D.   On: 04/23/2020 11:56   CT Head Wo Contrast  Result Date: 04/23/2020 CLINICAL DATA:  Un witnessed fall. EXAM: CT HEAD WITHOUT CONTRAST CT MAXILLOFACIAL WITHOUT CONTRAST CT CERVICAL SPINE WITHOUT CONTRAST TECHNIQUE: Multidetector CT imaging of the head, cervical spine, and maxillofacial structures were performed using the standard protocol without intravenous contrast. Multiplanar CT image reconstructions of the cervical spine and maxillofacial structures were also generated. COMPARISON:  CT cervical spine from 12/03/2014. CT head December 13, 2018. FINDINGS: CT HEAD FINDINGS Brain: Small volume of acute subarachnoid hemorrhage along the right frontal convexity (for example see series 4, image 22). Some hemorrhage layering within the anterior aspect of the right sylvian fissure and overlying the anterior right temporal convexity (series 6, image 12; series 4,  image 14). No definite acute intraparenchymal hemorrhage. 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 Similar additional patchy white matter hypodensities, most likely related to chronic microvascular ischemic disease. No evidence of acute large vascular territory infarct. No hydrocephalus. No evidence of intraventricular hemorrhage. No mass lesion or abnormal mass effect. Vascular: Calcific atherosclerosis. No hyperdense vessel identified. Skull: No acute fracture. Other: No mastoid effusions. CT MAXILLOFACIAL FINDINGS Osseous: No fracture or mandibular dislocation. No destructive process. Chronic right posterior zygomatic cerclage wire. Orbits: No retro bulbar hematoma. Globes are symmetric and within normal limits. No proptosis. Sinuses: Chronic hypoplastic right maxillary sinus. The sinuses are largely clear. No air-fluid levels. Soft tissues: Right periorbital/malar soft tissue contusion. CT CERVICAL SPINE FINDINGS Alignment: Similar alignment. Mild levocurvature. No substantial sagittal subluxation. Skull base and vertebrae: Vertebral body heights are maintained. No evidence of acute fracture. Similar degenerative endplate Schmorl's node involving the superior C4 endplate. Soft tissues and spinal canal: No prevertebral fluid or swelling. No visible canal hematoma. Disc levels: Mild to moderate multilevel degenerative disc disease with disc height loss and endplate spurring. No evidence of significant bony canal stenosis. Upper chest: Biapical pleuroparenchymal scarring. Otherwise, visualized lung apices are clear. IMPRESSION: CT head: 1. Small volume of acute right frontotemporal subarachnoid hemorrhage, as detailed above. No mass effect. 2. Remote right subinsular and right frontal infarcts and chronic microvascular ischemic disease. CT maxillofacial 1. No acute fracture. 2. Right periorbital/malar contusion. CT cervical spine: 1. No evidence of acute fracture or  traumatic malalignment. 2. Similar large left thyroid lesion, which was biopsied on Jun 18, 2013. Findings discussed with Dr. Tomi Bamberger via telephone at 12:40 PM. Electronically Signed   By: Margaretha Sheffield MD   On: 04/23/2020 12:43   CT Cervical Spine Wo Contrast  Result Date: 04/23/2020 CLINICAL DATA:  Un witnessed fall. EXAM: CT HEAD WITHOUT CONTRAST CT MAXILLOFACIAL WITHOUT CONTRAST CT CERVICAL SPINE WITHOUT CONTRAST TECHNIQUE: Multidetector CT imaging of the head, cervical spine, and maxillofacial structures were performed using the standard protocol without intravenous contrast. Multiplanar CT image reconstructions of the cervical spine and maxillofacial structures were also generated. COMPARISON:  CT cervical spine from 12/03/2014. CT head December 13, 2018. FINDINGS: CT HEAD FINDINGS Brain: Small volume of acute subarachnoid hemorrhage along the right frontal convexity (for example see series 4, image 22). Some hemorrhage layering within the anterior aspect of the right sylvian fissure and overlying the anterior right temporal convexity (series 6, image 12; series 4, image 14). No definite acute intraparenchymal hemorrhage. 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  Similar additional patchy white matter hypodensities, most likely related to chronic microvascular ischemic disease. No evidence of acute large vascular territory infarct. No hydrocephalus. No evidence of intraventricular hemorrhage. No mass lesion or abnormal mass effect. Vascular: Calcific atherosclerosis. No hyperdense vessel identified. Skull: No acute fracture. Other: No mastoid effusions. CT MAXILLOFACIAL FINDINGS Osseous: No fracture or mandibular dislocation. No destructive process. Chronic right posterior zygomatic cerclage wire. Orbits: No retro bulbar hematoma. Globes are symmetric and within normal limits. No proptosis. Sinuses: Chronic hypoplastic right maxillary sinus. The sinuses are largely  clear. No air-fluid levels. Soft tissues: Right periorbital/malar soft tissue contusion. CT CERVICAL SPINE FINDINGS Alignment: Similar alignment. Mild levocurvature. No substantial sagittal subluxation. Skull base and vertebrae: Vertebral body heights are maintained. No evidence of acute fracture. Similar degenerative endplate Schmorl's node involving the superior C4 endplate. Soft tissues and spinal canal: No prevertebral fluid or swelling. No visible canal hematoma. Disc levels: Mild to moderate multilevel degenerative disc disease with disc height loss and endplate spurring. No evidence of significant bony canal stenosis. Upper chest: Biapical pleuroparenchymal scarring. Otherwise, visualized lung apices are clear. IMPRESSION: CT head: 1. Small volume of acute right frontotemporal subarachnoid hemorrhage, as detailed above. No mass effect. 2. Remote right subinsular and right frontal infarcts and chronic microvascular ischemic disease. CT maxillofacial 1. No acute fracture. 2. Right periorbital/malar contusion. CT cervical spine: 1. No evidence of acute fracture or traumatic malalignment. 2. Similar large left thyroid lesion, which was biopsied on Jun 18, 2013. Findings discussed with Dr. Tomi Bamberger via telephone at 12:40 PM. Electronically Signed   By: Margaretha Sheffield MD   On: 04/23/2020 12:43   DG Chest Portable 1 View  Result Date: 04/23/2020 CLINICAL DATA:  Fall. EXAM: PORTABLE CHEST 1 VIEW COMPARISON:  Chest x-ray dated December 16, 2019. FINDINGS: The heart is at the upper limits of normal in size. Normal pulmonary vascularity. No focal consolidation, pleural effusion, or pneumothorax. No acute osseous abnormality. IMPRESSION: No active disease. Electronically Signed   By: Titus Dubin M.D.   On: 04/23/2020 12:00   DG Shoulder Left  Result Date: 04/23/2020 CLINICAL DATA:  Left shoulder pain after fall. EXAM: LEFT SHOULDER - 2+ VIEW COMPARISON:  Left shoulder x-rays dated Jul 11, 2019. FINDINGS: No  acute fracture or dislocation. Unchanged mild acromioclavicular osteoarthritis. Soft tissues are unremarkable. IMPRESSION: 1. No acute osseous abnormality. Electronically Signed   By: Titus Dubin M.D.   On: 04/23/2020 11:57   DG Hip Unilat W or Wo Pelvis 2-3 Views Left  Result Date: 04/23/2020 CLINICAL DATA:  Left hip pain after fall. EXAM: DG HIP (WITH OR WITHOUT PELVIS) 2-3V LEFT COMPARISON:  Left hip x-rays dated April 16, 2020. FINDINGS: No acute fracture or dislocation. Unchanged mild bilateral hip osteoarthritis. Osteopenia. Soft tissues are unremarkable. IMPRESSION: 1. No acute osseous abnormality. Electronically Signed   By: Titus Dubin M.D.   On: 04/23/2020 11:58   VAS [place] LOWER EXTREMITY VENOUS (DVT)  Result Date: 04/24/2020  Lower Venous DVT Study Indications: Swelling.  Limitations: Body habitus and poor ultrasound/tissue interface. Comparison Study: No prior studies. Performing Technologist: Oliver Hum RVT  Examination Guidelines: A complete evaluation includes B-mode imaging, spectral Doppler, color Doppler, and power Doppler as needed of all accessible portions of each vessel. Bilateral testing is considered an integral part of a complete examination. Limited examinations for reoccurring indications may be performed as noted. The reflux portion of the exam is performed with the patient in reverse Trendelenburg.  +-----+---------------+---------+-----------+----------+--------------+ RIGHTCompressibilityPhasicitySpontaneityPropertiesThrombus Aging +-----+---------------+---------+-----------+----------+--------------+ CFV  Full           Yes      Yes                                 +-----+---------------+---------+-----------+----------+--------------+   +---------+---------------+---------+-----------+----------+--------------+ LEFT     CompressibilityPhasicitySpontaneityPropertiesThrombus Aging  +---------+---------------+---------+-----------+----------+--------------+ CFV      Full           Yes      Yes                                 +---------+---------------+---------+-----------+----------+--------------+ SFJ      Full                                                        +---------+---------------+---------+-----------+----------+--------------+ FV Prox  Full                                                        +---------+---------------+---------+-----------+----------+--------------+ FV Mid   Full                                                        +---------+---------------+---------+-----------+----------+--------------+ FV DistalFull                                                        +---------+---------------+---------+-----------+----------+--------------+ PFV      Full                                                        +---------+---------------+---------+-----------+----------+--------------+ POP      Full           Yes      Yes                                 +---------+---------------+---------+-----------+----------+--------------+ PTV      Full                                                        +---------+---------------+---------+-----------+----------+--------------+ PERO     Full                                                        +---------+---------------+---------+-----------+----------+--------------+  Summary: RIGHT: - No evidence of common femoral vein obstruction.  LEFT: - There is no evidence of deep vein thrombosis in the lower extremity. However, portions of this examination were limited- see technologist comments above.  - No cystic structure found in the popliteal fossa.  *See table(s) above for measurements and observations. Electronically signed by Harold Barban MD on 04/24/2020 at 4:18:57 PM.    Final    CT Maxillofacial Wo Contrast  Result Date: 04/23/2020 CLINICAL DATA:  Un  witnessed fall. EXAM: CT HEAD WITHOUT CONTRAST CT MAXILLOFACIAL WITHOUT CONTRAST CT CERVICAL SPINE WITHOUT CONTRAST TECHNIQUE: Multidetector CT imaging of the head, cervical spine, and maxillofacial structures were performed using the standard protocol without intravenous contrast. Multiplanar CT image reconstructions of the cervical spine and maxillofacial structures were also generated. COMPARISON:  CT cervical spine from 12/03/2014. CT head December 13, 2018. FINDINGS: CT HEAD FINDINGS Brain: Small volume of acute subarachnoid hemorrhage along the right frontal convexity (for example see series 4, image 22). Some hemorrhage layering within the anterior aspect of the right sylvian fissure and overlying the anterior right temporal convexity (series 6, image 12; series 4, image 14). No definite acute intraparenchymal hemorrhage. 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 Similar additional patchy white matter hypodensities, most likely related to chronic microvascular ischemic disease. No evidence of acute large vascular territory infarct. No hydrocephalus. No evidence of intraventricular hemorrhage. No mass lesion or abnormal mass effect. Vascular: Calcific atherosclerosis. No hyperdense vessel identified. Skull: No acute fracture. Other: No mastoid effusions. CT MAXILLOFACIAL FINDINGS Osseous: No fracture or mandibular dislocation. No destructive process. Chronic right posterior zygomatic cerclage wire. Orbits: No retro bulbar hematoma. Globes are symmetric and within normal limits. No proptosis. Sinuses: Chronic hypoplastic right maxillary sinus. The sinuses are largely clear. No air-fluid levels. Soft tissues: Right periorbital/malar soft tissue contusion. CT CERVICAL SPINE FINDINGS Alignment: Similar alignment. Mild levocurvature. No substantial sagittal subluxation. Skull base and vertebrae: Vertebral body heights are maintained. No evidence of acute fracture. Similar  degenerative endplate Schmorl's node involving the superior C4 endplate. Soft tissues and spinal canal: No prevertebral fluid or swelling. No visible canal hematoma. Disc levels: Mild to moderate multilevel degenerative disc disease with disc height loss and endplate spurring. No evidence of significant bony canal stenosis. Upper chest: Biapical pleuroparenchymal scarring. Otherwise, visualized lung apices are clear. IMPRESSION: CT head: 1. Small volume of acute right frontotemporal subarachnoid hemorrhage, as detailed above. No mass effect. 2. Remote right subinsular and right frontal infarcts and chronic microvascular ischemic disease. CT maxillofacial 1. No acute fracture. 2. Right periorbital/malar contusion. CT cervical spine: 1. No evidence of acute fracture or traumatic malalignment. 2. Similar large left thyroid lesion, which was biopsied on Jun 18, 2013. Findings discussed with Dr. Tomi Bamberger via telephone at 12:40 PM. Electronically Signed   By: Margaretha [place] MD   On: 04/23/2020 12:43     LOS: 0 days   Antonieta Pert, MD [facility]  04/25/2020, 8:24 AM

## 2020-04-25 NOTE — Evaluation (Addendum)
Physical Therapy Evaluation Patient Details Name: Ariana [name] MRN: [id number] DOB: 1929/02/28 Today's Date: 04/25/2020   History of Present Illness  Pt admitted from home s/p fall with facial contusions and small subarachnoid hemorrhage.  Multiple X-rays but no new fxs noted.  However, pt with recent (04/20/20) minimally displaced L tib/fib fx and orders to "refrain from [facility] as much as possible" with CAM boot in place.  CAM boot is at home despite multiple calls from nursing and physician for family to bring it in. Pt with hx of Sjogrens, DM, peripheral neuropathy, Fibromyalgia, dementia, CVA and a-fib  Clinical Impression  Pt admitted as above and presenting with functional mobility limitations 2* generalized weakness, ongoing pain on R side, balance deficits, and limited WB allowed on L LE with cam boot in place.  Pt reports she does not have 24/7 available and is currently at very high risk of further falls.  Pt would benefit from follow up rehab at SNF level to maximize IND and safety prior to dc home. This date, pt appreciative of mobilizing to EOB sitting and allowed to sit x 20 minutes with supervision to insure min WB on L foot.  Standing not attempted as Cam boot has not arrived from home.  Follow Up Recommendations SNF    Equipment Recommendations  None recommended by PT    Recommendations for Other Services       Precautions / Restrictions Precautions Precautions: Fall Required Braces or Orthoses: Other Brace Other Brace: Cam boot for L ankle with OOB activity Restrictions Weight Bearing Restrictions: Yes Other Position/Activity Restrictions: min WB L ankle with CAM boot in place "refrain from WB as much as possible"      Mobility  Bed Mobility Overal bed mobility: Needs Assistance Bed Mobility: Supine to Sit;Sit to Supine     Supine to sit: Mod assist Sit to supine: Min assist   General bed mobility comments: Increased time with cues for sequence and limited use of L  LE; physical assist to bring trunk to upright and complete rotation to EOB sitting using bed pad    Transfers                 General transfer comment: NT - awaiting arrival of CAM boot before attempting`  Ambulation/Gait                Stairs            Wheelchair Mobility    Modified Rankin (Stroke Patients Only)       Balance Overall balance assessment: Needs assistance Sitting-balance support: No upper extremity supported Sitting balance-Leahy Scale: Good     Standing balance support:  (NT)                                 Pertinent Vitals/Pain Pain Assessment: Faces Faces Pain Scale: Hurts little more Pain Location: L LE from hip to ankle "I think its neuropathy" Pain Descriptors / Indicators: Aching;Sore Pain Intervention(s): Limited activity within patient's tolerance;Monitored during session    [place] expects to be discharged to:: Private residence Living Arrangements: Alone Available Help at Discharge: Family;Personal care attendant;Available PRN/intermittently Type of Home: House Home Access: Level entry     Home Layout: One level Home Equipment: Walker - 2 wheels;Walker - 4 wheels Additional Comments: Home health aide M-F 9-5 with Son in [place] checking on pt on the weekends "he brings my medication over"  Prior Function Level of Independence: Needs assistance   Gait / Transfers Assistance Needed: RW  ADL's / Homemaking Assistance Needed: has [place] aides        Hand Dominance   Dominant Hand: Right    Extremity/Trunk Assessment   Upper Extremity Assessment Upper Extremity Assessment: Generalized weakness    Lower Extremity Assessment Lower Extremity Assessment: Generalized weakness;LLE deficits/detail LLE Deficits / Details: recent minimally displaced tib/fib fx with min WB    Cervical / Trunk Assessment Cervical / Trunk Assessment: Kyphotic  Communication      Cognition  Arousal/Alertness: Awake/alert Behavior During Therapy: WFL for tasks assessed/performed Overall Cognitive Status: History of cognitive impairments - at baseline                                 General Comments: Very pleasant but meandering thought processes      General Comments      Exercises     Assessment/Plan    PT Assessment Patient needs continued PT services  PT Problem List Decreased strength;Decreased activity tolerance;Decreased balance;Decreased mobility;Decreased cognition;Decreased knowledge of use of DME;Pain;Decreased safety awareness       PT Treatment Interventions DME instruction;Gait training;Functional mobility training;Therapeutic activities;Therapeutic exercise;Patient/family education;Balance training    PT Goals (Current goals can be found in the Care Plan section)  Acute Rehab PT Goals Patient Stated Goal: Walk PT Goal Formulation: With patient Time For Goal Achievement: 05/09/20 Potential to Achieve Goals: Fair    Frequency Min 3X/week   Barriers to discharge Decreased caregiver support Does not have 24/7    Co-evaluation               AM-PAC PT "6 Clicks" Mobility  Outcome Measure Help needed turning from your back to your side while in a flat bed without using bedrails?: A Little Help needed moving from lying on your back to sitting on the side of a flat bed without using bedrails?: A Lot Help needed moving to and from a bed to a chair (including a wheelchair)?: A Lot Help needed standing up from a chair using your arms (e.g., wheelchair or bedside chair)?: A Lot Help needed to walk in hospital room?: A Lot Help needed climbing 3-5 steps with a railing? : Total 6 Click Score: 12    End of Session   Activity Tolerance: Patient tolerated treatment well Patient left: in bed;with call bell/phone within reach;with nursing/sitter in room Nurse Communication: Mobility status PT Visit Diagnosis: Muscle weakness  (generalized) (M62.81);Difficulty in walking, not elsewhere classified (R26.2);History of falling (Z91.81)    Time: 9892-1194 PT Time Calculation (min) (ACUTE ONLY): 38 min   Charges:   PT Evaluation $PT Eval Low Complexity: [place] [facility] Pager [phone] Office [phone]   Cerys Winget 04/25/2020, 4:35 PM

## 2020-04-26 DIAGNOSIS — W19XXXA Unspecified fall, initial encounter: Secondary | ICD-10-CM | POA: Diagnosis not present

## 2020-04-26 DIAGNOSIS — I609 Nontraumatic subarachnoid hemorrhage, unspecified: Secondary | ICD-10-CM | POA: Diagnosis not present

## 2020-04-26 LAB — GLUCOSE, CAPILLARY
Glucose-Capillary: 156 mg/dL — ABNORMAL HIGH (ref 70–99)
Glucose-Capillary: 207 mg/dL — ABNORMAL HIGH (ref 70–99)
Glucose-Capillary: 144 mg/dL — ABNORMAL HIGH (ref 70–99)
Glucose-Capillary: 124 mg/dL — ABNORMAL HIGH (ref 70–99)

## 2020-04-26 LAB — URINALYSIS, ROUTINE W REFLEX MICROSCOPIC
Bilirubin Urine: NEGATIVE
Glucose, UA: NEGATIVE mg/dL
Hgb urine dipstick: NEGATIVE
Ketones, ur: NEGATIVE mg/dL
Nitrite: NEGATIVE
Protein, ur: NEGATIVE mg/dL
Specific Gravity, Urine: 1.008 (ref 1.005–1.030)
pH: 6 (ref 5.0–8.0)

## 2020-04-26 NOTE — Care Management Obs Status (Signed)
[place] NOTIFICATION   Patient Details  Name: MARIANGEL RINGLEY MRN: [id number] Date of Birth: 11-Sep-1929   Medicare Observation Status Notification Given:  Yes    Lynnell Catalan, RN 04/26/2020, 3:38 PM

## 2020-04-26 NOTE — Progress Notes (Signed)
Orthopedic Tech Progress Note Patient Details:  Ariana [name] 07-10-1929 [id number]  Patient ID: [name], female   DOB: 1929/10/21, 87 y.o.   MRN: [id number]   [name] 04/26/2020, 7:58 PMPatient has Cam boot on.

## 2020-04-26 NOTE — Progress Notes (Signed)
PROGRESS NOTE    Ariana [name]  DZH:[id number] DOB: [birth year] DOA: 04/23/2020 PCP: Gayland Curry, DO   Chief Complaint  Patient presents with  . Fall  . Eye Pain  . Shoulder Injury  . Hip Pain  . Ankle Pain  Brief Narrative: 87 year old female with history of dementia and recurrent falls depression diabetes, paroxysmal A. fib and Sjogren's syndrome hypertension recent left ankle fracture admitted to [facility] with recurrent fall at home.  Recently seen by orthopedic on 3 8 for her left minimally displaced distal fibula fracture and was placed on Cam walker boot and advised to refrain from weightbearing as much as possible and was taking hydrocodone for pain control and was to follow-up in 2 weeks. 3/11-she tried to get up and go to the bathroom and fell and hit her face and has pain in her shoulder, left hip and left ankle. Did not have LOC. Unwitnessed fall.   ED Course: Afebrile, hemodynamically stable, on room air. Notable Labs: Glucose 137, otherwise labs unremarkable. Notable Imaging: CT head-small volume acute right frontotemporal subarachnoid hemorrhage and remote right subinsular and right frontal infarcts with chronic microvascular ischemic disease.  Otherwise, XR of her left ankle, shoulder, hip, chest unremarkable. ED provider discussed with neurosurgery who did not recommend any further treatment or evaluation of her [facility] Patient was admitted with PT consultation and for pain control. PT has seen the patient and has advised a skilled nursing facility.  Subjective: Seen this morning she is alert awake oriented to self, place.  Denies any headache numbness tingling or any focal weakness.    Assessment & Plan:  Recurrent fall Right facial bruise Recent left ankle fracture/minimally displaced left distal fibula fracture-followed by orthopedic as outpatient on CAM Walker boot Suspect polypharmacy, comorbidities old age contributing to deconditioning and fall. Suspect patient  stood onto her left ankle when getting out of bed and her gave out due to the pain.  No actual syncope.  Left hip x-ray no fracture.  She has had x-ray left ankle left shoulder chest x-ray and CT head C-spine and maxillofacial area. Continue to mobilize with PT OT, has recommended skilled nursing facility, TOC notified.  Minimize narcotics sedative medication. Daughter reports she gets very confused when she has UTI- check UA.    Subarachnoid hemorrhage likely secondary to fall.  Neurosurgery was consulted by ED provider who did not recommend any further treatment or work-up or follow-up.  Will avoid aspirin Lovenox or heparin. CT head read as-" Small volume of acute right frontotemporal subarachnoid hemorrhage, as detailed above. No mass effect.Remote right subinsular and right frontal infarcts and chronic microvascular ischemic disease" appears neurologically intact and nonfocal.  PAF in sinus rhythm holding aspirin due to [facility].  Cardizem remains on hold, heart rate is stable.   Dementia at baseline continue Namenda.  Continue delirium precaution fall precaution supportive care Type 2 diabetes mellitus continue home Januvia>Trandjenta.  Well controlled in 140-180 IBS continue home linzess and prn laxatives GOC- DNR  Diet Order            Diet Heart Room service appropriate? Yes; Fluid consistency: Thin  Diet effective now                Patient's Body mass index is 26.56 kg/m.  DVT prophylaxis: Place and maintain sequential compression device Start: 04/23/20 1446 SCDs Start: 04/23/20 1416 Code Status:   Code Status: Full Code  Family Communication: plan of care discussed with patient  Discussed with nursing  staff. Was unable to reach son-in-law and daughter on the phone 3/12 Able to reach daughter and discussed/udpated this morning.  Status is: Admitted as observation Remains hospitalized for ongoing PT OT evaluation anticipating skilled nursing facility placement.  Dispo: The  patient is from:Home              Anticipated d/c is to: TBD              Patient currently is medically stable for discharge   Difficult to place patient No  Unresulted Labs (From admission, onward)         None      Medications reviewed:  Scheduled Meds: . DULoxetine  90 mg Oral Daily  . insulin aspart  0-9 Units Subcutaneous TID WC  . linaclotide  290 mcg Oral QHS  . linagliptin  5 mg Oral Daily  . memantine  10 mg Oral BID  . mirabegron ER  50 mg Oral Daily  . pantoprazole  20 mg Oral Daily  . pregabalin  100 mg Oral BID  . sodium chloride flush  3 mL Intravenous Q12H   Continuous Infusions:  Consultants:see note  Procedures:see note  Antimicrobials: Anti-infectives (From admission, onward)   None    Culture/Microbiology Other culture-see note Objective: Vitals: Today's Vitals   04/25/20 2040 04/25/20 2210 04/26/20 0431 04/26/20 0800  BP: (!) 148/76  (!) 149/73   Pulse: 90  77   Resp: 15     Temp: 98.1 F (36.7 C)  97.6 F (36.4 C)   TempSrc: Oral  Oral   SpO2: 96%  94%   Weight:      Height:      PainSc:  0-No pain  0-No pain    Intake/Output Summary (Last 24 hours) at 04/26/2020 1106 Last data filed at 04/26/2020 0842 Gross per 24 hour  Intake 460 ml  Output 550 ml  Net -90 ml   Filed Weights   04/23/20 1027  Weight: 68 kg   Weight change:   Intake/Output from previous day: 03/13 0701 - 03/14 0700 In: 480 [P.O.:480] Out: 550 [Urine:550] Intake/Output this shift: Total I/O In: 100 [P.O.:100] Out: -  Filed Weights   04/23/20 1027  Weight: 68 kg   Examination: General exam: AAO to self, current place,weak appearing. HEENT:Oral mucosa moist, Ear/Nose WNL grossly,dentition normal. Respiratory system: bilaterally diminishedd,no wheezing or crackles,no use of accessory muscle Cardiovascular system: S1 & S2 +, No JVD,. Gastrointestinal system: Abdomen soft, NT,ND, BS+ Nervous System:Alert, awake, moving extremities and grossly  nonfocal Extremities: No edema, distal peripheral pulses palpable.  Skin: No rashes,no icterus.  Right facial bruise present. MSK: Normal muscle bulk,tone, power.  Data Reviewed: I have personally reviewed following labs and imaging studies CBC: Recent Labs  Lab 04/23/20 1051 04/24/20 0547 04/25/20 0628  WBC 6.5 7.5 7.0  HGB 12.2 13.1 13.5  HCT 37.4 40.0 41.0  MCV 96.4 94.8 94.0  PLT 218 241 277   Basic Metabolic Panel: Recent Labs  Lab 04/23/20 1051 04/24/20 0547 04/25/20 0628  NA 139 134* 136  K 4.0 4.0 3.8  CL 101 98 102  CO2 27 25 25   GLUCOSE 137* 147* 161*  BUN 18 21 12   CREATININE 0.56 0.85 0.63  CALCIUM 9.8 9.3 9.6   GFR: Estimated Creatinine Clearance: 45 mL/min (by C-G formula based on SCr of 0.63 mg/dL). Liver Function Tests: No results for input(s): AST, ALT, ALKPHOS, BILITOT, PROT, ALBUMIN in the last 168 hours. No results for input(s): LIPASE, AMYLASE  in the last 168 hours. No results for input(s): AMMONIA in the last 168 hours. Coagulation Profile: No results for input(s): INR, PROTIME in the last 168 hours. Cardiac Enzymes: No results for input(s): CKTOTAL, CKMB, CKMBINDEX, TROPONINI in the last 168 hours. BNP (last 3 results) No results for input(s): PROBNP in the last 8760 hours. HbA1C: No results for input(s): HGBA1C in the last 72 hours. CBG: Recent Labs  Lab 04/25/20 1225 04/25/20 1650 04/25/20 2105 04/26/20 0729  GLUCAP 183* 145* 149* 156*   Lipid Profile: No results for input(s): CHOL, HDL, LDLCALC, TRIG, CHOLHDL, LDLDIRECT in the last 72 hours. Thyroid Function Tests: No results for input(s): TSH, T4TOTAL, FREET4, T3FREE, THYROIDAB in the last 72 hours. Anemia Panel: No results for input(s): VITAMINB12, FOLATE, FERRITIN, TIBC, IRON, RETICCTPCT in the last 72 hours. Sepsis Labs: No results for input(s): PROCALCITON, LATICACIDVEN in the last 168 hours.  Recent Results (from the past 240 hour(s))  Resp Panel by RT-PCR (Flu A&B,  Covid) Nasopharyngeal Swab     Status: None   Collection Time: 04/23/20  2:10 PM   Specimen: Nasopharyngeal Swab; Nasopharyngeal(NP) swabs in vial transport medium  Result Value Ref Range Status   SARS Coronavirus 2 by RT PCR NEGATIVE NEGATIVE Final    Comment: (NOTE) SARS-CoV-2 target nucleic acids are NOT DETECTED.  The SARS-CoV-2 RNA is generally detectable in upper respiratory specimens during the acute phase of infection. The lowest concentration of SARS-CoV-2 viral copies this assay can detect is 138 copies/mL. A negative result does not preclude SARS-Cov-2 infection and should not be used as the sole basis for treatment or other patient management decisions. A negative result may occur with  improper specimen collection/handling, submission of specimen other than nasopharyngeal swab, presence of viral mutation(s) within the areas targeted by this assay, and inadequate number of viral copies(<138 copies/mL). A negative result must be combined with clinical observations, patient history, and epidemiological information. The expected result is Negative.  Fact Sheet for Patients:  EntrepreneurPulse.com.au  Fact Sheet for Healthcare Providers:  IncredibleEmployment.be  This test is no t yet approved or cleared by the [place] FDA and  has been authorized for detection and/or diagnosis of SARS-CoV-2 by FDA under an Emergency Use Authorization (EUA). This EUA will remain  in effect (meaning this test can be used) for the duration of the COVID-19 declaration under Section 564(b)(1) of the Act, 21 U.S.C.section 360bbb-3(b)(1), unless the authorization is terminated  or revoked sooner.       Influenza A by PCR NEGATIVE NEGATIVE Final   Influenza B by PCR NEGATIVE NEGATIVE Final    Comment: (NOTE) The Xpert Xpress SARS-CoV-2/FLU/RSV plus assay is intended as an aid in the diagnosis of influenza from Nasopharyngeal swab specimens and should  not be used as a sole basis for treatment. Nasal washings and aspirates are unacceptable for Xpert Xpress SARS-CoV-2/FLU/RSV testing.  Fact Sheet for Patients: EntrepreneurPulse.com.au  Fact Sheet for Healthcare Providers: IncredibleEmployment.be  This test is not yet approved or cleared by the [place] FDA and has been authorized for detection and/or diagnosis of SARS-CoV-2 by FDA under an Emergency Use Authorization (EUA). This EUA will remain in effect (meaning this test can be used) for the duration of the COVID-19 declaration under Section 564(b)(1) of the Act, 21 U.S.C. section 360bbb-3(b)(1), unless the authorization is terminated or revoked.  Performed at [facility], [place] [address], [place], [place] [address]    Radiology Studies: No results found.   LOS: 0 days  Antonieta Pert, MD [facility]  04/26/2020, 11:06 AM

## 2020-04-26 NOTE — TOC Initial Note (Addendum)
Transition of Care [facility]) - Initial/Assessment Note    Patient Details  Name: Ariana [name] MRN: [id number] Date of Birth: 02-Sep-1929  [facility] CM/SW Contact:    Lynnell Catalan, RN Phone Number: 04/26/2020, 2:44 PM  Clinical Narrative:                 Spoke with pt at bedside for dc planning. Pt appears confused so daughter Alexus contacted via phone. SNF recommendation gone over with daughter. She agrees to allow FL2 to be faxed out to area facilities. Will start insurance auth once PT has seen pt again now that family has brought her CAM walker. TOC will continue to follow.  AddendumJosem Kaufmann for SNF started with [facility] Ref # [id number].   Expected Discharge Plan: Skilled Nursing Facility Barriers to Discharge: Family Issues (Family delaying bring CAM walker to hospital)   Patient Goals and CMS Choice Patient states their goals for this hospitalization and ongoing recovery are:: Pt confused      Expected Discharge Plan and Services Expected Discharge Plan: [place]   Discharge Planning Services: CM Consult   Living arrangements for the past 2 months: Single Family Home                                      Prior Living Arrangements/Services Living arrangements for the past 2 months: Single Family Home Lives with:: Self Patient language and need for interpreter reviewed:: Yes        Need for Family Participation in Patient Care: Yes (Comment) Care giver support system in place?: Yes (comment) Current home services: Homehealth aide (Has private duty aide 9am-5pm daily) Criminal Activity/Legal Involvement Pertinent to Current Situation/Hospitalization: No - Comment as needed  Activities of Daily Living Home Assistive Devices/Equipment: Other (Comment),Eyeglasses,Walker (specify type),Cane (specify quad or straight),Wheelchair,Shower chair with back (left cam walker) ADL Screening (condition at time of admission) Patient's cognitive  ability adequate to safely complete daily activities?: No Is the patient deaf or have difficulty hearing?: No Does the patient have difficulty seeing, even when wearing glasses/contacts?: No Does the patient have difficulty concentrating, remembering, or making decisions?: Yes Patient able to express need for assistance with ADLs?: Yes Does the patient have difficulty dressing or bathing?: Yes Independently performs ADLs?: No Communication: Independent Dressing (OT): Needs assistance Is this a change from baseline?: Pre-admission baseline Grooming: Independent Feeding: Independent Bathing: Needs assistance Is this a change from baseline?: Pre-admission baseline Toileting: Needs assistance Is this a change from baseline?: Pre-admission baseline In/Out Bed: Needs assistance Is this a change from baseline?: Pre-admission baseline Walks in Home: Needs assistance Is this a change from baseline?: Pre-admission baseline Does the patient have difficulty walking or climbing stairs?: Yes Weakness of Legs: Left Weakness of Arms/Hands: None  Permission Sought/Granted Permission sought to share information with : Facility Art therapist granted to share information with : Yes, Verbal Permission Granted              Emotional Assessment Appearance:: Appears stated age Attitude/Demeanor/Rapport: Reactive Affect (typically observed): Frustrated Orientation: : Oriented to Self Alcohol / Substance Use: Not Applicable    Admission diagnosis:  Subarachnoid hemorrhage ([place]) [I60.9] Fall [W19.XXXA] Contusion of face, initial encounter [B01.75ZW] Patient Active Problem List   Diagnosis Date Noted  . Subarachnoid hemorrhage ([place]) 04/23/2020  . Diabetic neuropathy ([place]) 01/28/2019  . Diabetes mellitus due to underlying condition, uncontrolled (  [place]) 09/04/2017  . History of rectal bleeding 09/04/2017  . A-fib ([place]) 04/23/2017  . Diabetes mellitus type 2 in nonobese ([place])  04/23/2017  . Sjogren's disease ([place]) 04/03/2016  . Primary osteoarthritis of both hands 04/03/2016  . High risk medication use 04/03/2016  . Senile dementia, with behavioral disturbance ([place]) 08/16/2015  . Swelling 08/16/2015  . Diastolic dysfunction 93/57/0177  . Hypertonicity, bladder 07/08/2015  . Arterial hypotension   . Bradycardia 06/29/2015  . Chronic lower back pain 06/29/2015  . PAF (paroxysmal atrial fibrillation) ([place]) 04/18/2015  . Dehydration 04/18/2015  . Dementia without behavioral disturbance ([place]) 04/18/2015  . TIA (transient ischemic attack) 12/17/2014  . Chest pain 12/16/2014  . Acute encephalopathy 12/04/2014  . Fall   . AKI (acute kidney injury) ([place]) 12/03/2014  . History of cerebrovascular disease 09/24/2014  . Falls frequently 07/28/2014  . Infarction of parietal lobe ([place])   . Mild dementia ([place])   . CVA (cerebral infarction) 07/26/2014  . Atrial fibrillation with RVR ([place]) 07/03/2014  . Constipation 04/15/2014  . Mixed stress and urge urinary incontinence 11/03/2013  . Therapeutic opioid induced constipation 11/03/2013  . Hemorrhoid 11/03/2013  . Low back pain associated with a spinal disorder other than radiculopathy or spinal stenosis 11/03/2013  . Protein-calorie malnutrition, severe ([place]) 07/22/2013  . UTI (lower urinary tract infection) 07/20/2013  . Prolonged QT interval 07/20/2013  . Osteopenia 07/17/2013  . Palpitations 06/30/2013  . Hereditary and idiopathic peripheral neuropathy 05/22/2013  . Abnormality of gait 12/05/2012  . Headache(784.0) 09/05/2012  . Multinodular thyroid 01/15/2012  . Neck pain 01/15/2012  . Hypercholesterolemia 07/08/2010  . Tear film insufficiency 07/06/2009  . [place] SYNDROME 07/06/2009  . Urge urinary incontinence 01/06/2009  . COLONIC POLYPS, ADENOMATOUS, HX OF 04/15/2008  . MITRAL VALVE PROLAPSE 11/05/2007  . ANXIETY DEPRESSION 07/02/2007  . Mononeuritis 07/02/2007  . HYPERTENSION, BENIGN 07/02/2007   . GERD 07/02/2007  . Irritable bowel syndrome 07/02/2007  . Fibromyalgia 07/02/2007   PCP:  Gayland Curry, DO Pharmacy:   [facility], [place] [address] Phone: [phone] Fax: [phone]     Social Determinants of Health (SDOH) Interventions    Readmission Risk Interventions No flowsheet data found.

## 2020-04-26 NOTE — NC FL2 (Signed)
[place] LEVEL OF CARE SCREENING TOOL     IDENTIFICATION  Patient Name: Ariana [name] Birthdate: 1929/12/26 Sex: female Admission Date (Current Location): 04/23/2020  [facility] and [place] Number:  Herbalist and Address:  [facility],  [place] [address], [place]      Provider Number: [id number]  Attending Physician Name and Address:  Antonieta Pert, MD  Relative Name and Phone Number:       Current Level of Care: Hospital Recommended Level of Care: [place] Prior Approval Number:    Date Approved/Denied:   PASRR Number: [id number]  Discharge Plan: SNF    Current Diagnoses: Patient Active Problem List   Diagnosis Date Noted  . Subarachnoid hemorrhage ([place]) 04/23/2020  . Diabetic neuropathy ([place]) 01/28/2019  . Diabetes mellitus due to underlying condition, uncontrolled ([place]) 09/04/2017  . History of rectal bleeding 09/04/2017  . A-fib ([place]) 04/23/2017  . Diabetes mellitus type 2 in nonobese ([place]) 04/23/2017  . Sjogren's disease ([place]) 04/03/2016  . Primary osteoarthritis of both hands 04/03/2016  . High risk medication use 04/03/2016  . Senile dementia, with behavioral disturbance ([place]) 08/16/2015  . Swelling 08/16/2015  . Diastolic dysfunction 48/18/5631  . Hypertonicity, bladder 07/08/2015  . Arterial hypotension   . Bradycardia 06/29/2015  . Chronic lower back pain 06/29/2015  . PAF (paroxysmal atrial fibrillation) ([place]) 04/18/2015  . Dehydration 04/18/2015  . Dementia without behavioral disturbance ([place]) 04/18/2015  . TIA (transient ischemic attack) 12/17/2014  . Chest pain 12/16/2014  . Acute encephalopathy 12/04/2014  . Fall   . AKI (acute kidney injury) ([place]) 12/03/2014  . History of cerebrovascular disease 09/24/2014  . Falls frequently 07/28/2014  . Infarction of parietal lobe ([place])   . Mild dementia ([place])   . CVA (cerebral infarction) 07/26/2014  . Atrial fibrillation with RVR ([place])  07/03/2014  . Constipation 04/15/2014  . Mixed stress and urge urinary incontinence 11/03/2013  . Therapeutic opioid induced constipation 11/03/2013  . Hemorrhoid 11/03/2013  . Low back pain associated with a spinal disorder other than radiculopathy or spinal stenosis 11/03/2013  . Protein-calorie malnutrition, severe ([place]) 07/22/2013  . UTI (lower urinary tract infection) 07/20/2013  . Prolonged QT interval 07/20/2013  . Osteopenia 07/17/2013  . Palpitations 06/30/2013  . Hereditary and idiopathic peripheral neuropathy 05/22/2013  . Abnormality of gait 12/05/2012  . Headache(784.0) 09/05/2012  . Multinodular thyroid 01/15/2012  . Neck pain 01/15/2012  . Hypercholesterolemia 07/08/2010  . Tear film insufficiency 07/06/2009  . [place] SYNDROME 07/06/2009  . Urge urinary incontinence 01/06/2009  . COLONIC POLYPS, ADENOMATOUS, HX OF 04/15/2008  . MITRAL VALVE PROLAPSE 11/05/2007  . ANXIETY DEPRESSION 07/02/2007  . Mononeuritis 07/02/2007  . HYPERTENSION, BENIGN 07/02/2007  . GERD 07/02/2007  . Irritable bowel syndrome 07/02/2007  . Fibromyalgia 07/02/2007    Orientation RESPIRATION BLADDER Height & Weight     Self  Normal Incontinent Weight: 68 kg Height:  5\' 3"  (160 cm)  BEHAVIORAL SYMPTOMS/MOOD NEUROLOGICAL BOWEL NUTRITION STATUS      Incontinent Diet (Heart Healthy)  AMBULATORY STATUS COMMUNICATION OF NEEDS Skin   Extensive Assist Verbally Bruising                       Personal Care Assistance Level of Assistance  Bathing,Dressing Bathing Assistance: Limited assistance   Dressing Assistance: Limited assistance     Functional Limitations Info  Sight Sight Info: Impaired (Swelling to right eye from fall)  SPECIAL CARE FACTORS FREQUENCY  PT (By licensed PT),OT (By licensed OT)     PT Frequency: 5 x weekly OT Frequency: 5 x weekly            Contractures Contractures Info: Not present    Additional Factors Info  Code Status,Allergies Code  Status Info: Full Allergies Info: Banana, Codeine, Klonopin (Clonazepam), Meperidine Hcl, Norflex (Orphenadrine Citrate), Oxycodone-acetaminophen, Propoxyphene Hcl, Zoloft (Sertraline Hcl), Doxycycline, Naproxen, Penicillins, Phenothiazines, Stelazine, Sulfamethoxazole-trimethoprim, Tolectin (Tolmetin Sodium), Tramadol           Current Medications (04/26/2020):  This is the current hospital active medication list Current Facility-Administered Medications  Medication Dose Route Frequency Provider Last Rate Last Admin  . acetaminophen (TYLENOL) tablet 650 mg  650 mg Oral Q6H PRN Harold Hedge, MD   650 mg at 04/24/20 0135   Or  . acetaminophen (TYLENOL) suppository 650 mg  650 mg Rectal Q6H PRN Harold Hedge, MD      . DULoxetine (CYMBALTA) DR capsule 90 mg  90 mg Oral Daily Harold Hedge, MD   90 mg at 04/26/20 0947  . insulin aspart (novoLOG) injection 0-9 Units  0-9 Units Subcutaneous TID WC Antonieta Pert, MD   3 Units at 04/26/20 1211  . linaclotide (LINZESS) capsule 290 mcg  290 mcg Oral QHS Harold Hedge, MD   290 mcg at 04/25/20 2146  . linagliptin (TRADJENTA) tablet 5 mg  5 mg Oral Daily Harold Hedge, MD   5 mg at 04/26/20 0947  . memantine (NAMENDA) tablet 10 mg  10 mg Oral BID Harold Hedge, MD   10 mg at 04/26/20 0947  . mirabegron ER (MYRBETRIQ) tablet 50 mg  50 mg Oral Daily Harold Hedge, MD   50 mg at 04/26/20 0946  . pantoprazole (PROTONIX) EC tablet 20 mg  20 mg Oral Daily Harold Hedge, MD   20 mg at 04/26/20 0947  . polyethylene glycol (MIRALAX / GLYCOLAX) packet 17 g  17 g Oral Daily PRN Harold Hedge, MD      . pregabalin (LYRICA) capsule 100 mg  100 mg Oral BID Harold Hedge, MD   100 mg at 04/26/20 0947  . sodium chloride flush (NS) 0.9 % injection 3 mL  3 mL Intravenous Q12H Harold Hedge, MD   3 mL at 04/26/20 9485     Discharge Medications: Please see discharge summary for a list of discharge medications.  Relevant Imaging Results:  Relevant Lab  Results:   Additional Information SS#: [id number]  Jaylynn Mcaleer, Marjie Skiff, RN

## 2020-04-26 NOTE — Progress Notes (Signed)
Physical Therapy Treatment Patient Details Name: Ariana [name] MRN: [id number] DOB: 01-17-1930 Today's Date: 04/26/2020    History of Present Illness Pt admitted from home s/p fall with facial contusions and small subarachnoid hemorrhage.  Multiple X-rays but no new fxs noted.  However, pt with recent (04/20/20) minimally displaced L tib/fib fx and orders to "refrain from [facility] as much as possible" with CAM boot in place.  CAM boot is at home despite multiple calls from nursing and physician for family to bring it in. Pt with hx of Sjogrens, DM, peripheral neuropathy, Fibromyalgia, dementia, CVA and a-fib    PT Comments    CAM boot was delivered from home. Pt believes she has been wearing it about 3 weeks.  Assisted pt OOB to [facility] then to recliner was difficult. General bed mobility comments: Increased time with cues for sequence and limited use of L LE; physical assist to bring trunk to upright and complete rotation to EOB sitting using bed pad mild c/o dizziness General transfer comment: applied CAM Boot L LE and assisted from elevated bed to [facility] 1/4 pivot with 100% VC's to avoid WBing L LE required increased assist to complete 1/4 turn and pt non compliant with WBing.  From [facility] assisted with standing long enough to switch recliner from behind.General Gait Details: transfers only Pt will need ST Rehab at SNF prior to returning home due to poor transfer ability and weakness.    Follow Up Recommendations  SNF     Equipment Recommendations  None recommended by PT    Recommendations for Other Services       Precautions / Restrictions Precautions Precautions: Fall Precaution Comments: Hx dementia Required Braces or Orthoses: Other Brace Other Brace: Cam boot for L ankle with OOB activity Restrictions Weight Bearing Restrictions: Yes Other Position/Activity Restrictions: min WB L ankle with CAM boot in place "refrain from WB as much as possible"    Mobility  Bed Mobility Overal bed  mobility: Needs Assistance Bed Mobility: Supine to Sit     Supine to sit: Mod assist     General bed mobility comments: Increased time with cues for sequence and limited use of L LE; physical assist to bring trunk to upright and complete rotation to EOB sitting using bed pad mild c/o dizziness    Transfers Overall transfer level: Needs assistance Equipment used: Rolling walker (2 wheeled) Transfers: Sit to/from [facility] Sit to Stand: Min assist;Mod assist Stand pivot transfers: Mod assist;Max assist       General transfer comment: applied CAM Boot L LE and assisted from elevated bed to [facility] 1/4 pivot with 100% VC's to avoid WBing L LE required increased assist to complete 1/4 turn and pt non compliant with WBing.  From [facility] assisted with standing long enough to switch recliner from behind.  Ambulation/Gait             General Gait Details: transfers only   Stairs             Wheelchair Mobility    Modified Rankin (Stroke Patients Only)       Balance                                            Cognition Arousal/Alertness: Awake/alert Behavior During Therapy: WFL for tasks assessed/performed Overall Cognitive Status: History of cognitive impairments - at baseline  General Comments: AxO x 1 pleasant lady but required repeat instruction NOT to stand on Left LE      Exercises      General Comments        Pertinent Vitals/Pain Pain Assessment: Faces Faces Pain Scale: Hurts little more Pain Location: L hip Pain Descriptors / Indicators: Aching;Nagging Pain Intervention(s): Monitored during session;Repositioned    Home Living                      Prior Function            PT Goals (current goals can now be found in the care plan section) Progress towards PT goals: Progressing toward goals    Frequency    Min 3X/week      PT Plan Current plan  remains appropriate    Co-evaluation              AM-PAC PT "6 Clicks" Mobility   Outcome Measure  Help needed turning from your back to your side while in a flat bed without using bedrails?: A Little Help needed moving from lying on your back to sitting on the side of a flat bed without using bedrails?: A Lot Help needed moving to and from a bed to a chair (including a wheelchair)?: A Lot Help needed standing up from a chair using your arms (e.g., wheelchair or bedside chair)?: A Lot Help needed to walk in hospital room?: Total Help needed climbing 3-5 steps with a railing? : Total 6 Click Score: 11    End of Session Equipment Utilized During Treatment: Gait belt Activity Tolerance: Patient tolerated treatment well Patient left: in chair;with call bell/phone within reach;with bed alarm set Nurse Communication: Mobility status PT Visit Diagnosis: Muscle weakness (generalized) (M62.81);Difficulty in walking, not elsewhere classified (R26.2);History of falling (Z91.81)     Time:  - 14:05  -  14:30       Charges:     2 ta                     Rica Koyanagi  PTA Acute  Rehabilitation Services Pager      [phone] Office      [phone]

## 2020-04-27 DIAGNOSIS — R41841 Cognitive communication deficit: Secondary | ICD-10-CM | POA: Diagnosis not present

## 2020-04-27 DIAGNOSIS — S8262XA Displaced fracture of lateral malleolus of left fibula, initial encounter for closed fracture: Secondary | ICD-10-CM | POA: Diagnosis not present

## 2020-04-27 DIAGNOSIS — H5711 Ocular pain, right eye: Secondary | ICD-10-CM | POA: Diagnosis not present

## 2020-04-27 DIAGNOSIS — Z87891 Personal history of nicotine dependence: Secondary | ICD-10-CM | POA: Diagnosis not present

## 2020-04-27 DIAGNOSIS — S0083XA Contusion of other part of head, initial encounter: Secondary | ICD-10-CM | POA: Diagnosis not present

## 2020-04-27 DIAGNOSIS — Z8673 Personal history of transient ischemic attack (TIA), and cerebral infarction without residual deficits: Secondary | ICD-10-CM | POA: Diagnosis not present

## 2020-04-27 DIAGNOSIS — E0865 Diabetes mellitus due to underlying condition with hyperglycemia: Secondary | ICD-10-CM | POA: Diagnosis not present

## 2020-04-27 DIAGNOSIS — R296 Repeated falls: Secondary | ICD-10-CM | POA: Diagnosis not present

## 2020-04-27 DIAGNOSIS — I481 Persistent atrial fibrillation: Secondary | ICD-10-CM | POA: Diagnosis not present

## 2020-04-27 DIAGNOSIS — M25512 Pain in left shoulder: Secondary | ICD-10-CM | POA: Diagnosis not present

## 2020-04-27 DIAGNOSIS — E1140 Type 2 diabetes mellitus with diabetic neuropathy, unspecified: Secondary | ICD-10-CM | POA: Diagnosis not present

## 2020-04-27 DIAGNOSIS — S0083XD Contusion of other part of head, subsequent encounter: Secondary | ICD-10-CM | POA: Diagnosis not present

## 2020-04-27 DIAGNOSIS — F0150 Vascular dementia without behavioral disturbance: Secondary | ICD-10-CM | POA: Diagnosis not present

## 2020-04-27 DIAGNOSIS — M25572 Pain in left ankle and joints of left foot: Secondary | ICD-10-CM | POA: Diagnosis not present

## 2020-04-27 DIAGNOSIS — S8265XD Nondisplaced fracture of lateral malleolus of left fibula, subsequent encounter for closed fracture with routine healing: Secondary | ICD-10-CM | POA: Diagnosis not present

## 2020-04-27 DIAGNOSIS — I959 Hypotension, unspecified: Secondary | ICD-10-CM | POA: Diagnosis not present

## 2020-04-27 DIAGNOSIS — E119 Type 2 diabetes mellitus without complications: Secondary | ICD-10-CM | POA: Diagnosis not present

## 2020-04-27 DIAGNOSIS — I10 Essential (primary) hypertension: Secondary | ICD-10-CM | POA: Diagnosis not present

## 2020-04-27 DIAGNOSIS — S82832D Other fracture of upper and lower end of left fibula, subsequent encounter for closed fracture with routine healing: Secondary | ICD-10-CM | POA: Diagnosis not present

## 2020-04-27 DIAGNOSIS — M3500 Sicca syndrome, unspecified: Secondary | ICD-10-CM | POA: Diagnosis not present

## 2020-04-27 DIAGNOSIS — S066X0A Traumatic subarachnoid hemorrhage without loss of consciousness, initial encounter: Secondary | ICD-10-CM | POA: Diagnosis not present

## 2020-04-27 DIAGNOSIS — I69828 Other speech and language deficits following other cerebrovascular disease: Secondary | ICD-10-CM | POA: Diagnosis not present

## 2020-04-27 DIAGNOSIS — M6281 Muscle weakness (generalized): Secondary | ICD-10-CM | POA: Diagnosis not present

## 2020-04-27 DIAGNOSIS — Z7982 Long term (current) use of aspirin: Secondary | ICD-10-CM | POA: Diagnosis not present

## 2020-04-27 DIAGNOSIS — I480 Paroxysmal atrial fibrillation: Secondary | ICD-10-CM | POA: Diagnosis not present

## 2020-04-27 DIAGNOSIS — E7800 Pure hypercholesterolemia, unspecified: Secondary | ICD-10-CM | POA: Diagnosis not present

## 2020-04-27 DIAGNOSIS — R2681 Unsteadiness on feet: Secondary | ICD-10-CM | POA: Diagnosis not present

## 2020-04-27 DIAGNOSIS — Z20822 Contact with and (suspected) exposure to covid-19: Secondary | ICD-10-CM | POA: Diagnosis not present

## 2020-04-27 DIAGNOSIS — W19XXXA Unspecified fall, initial encounter: Secondary | ICD-10-CM | POA: Diagnosis not present

## 2020-04-27 DIAGNOSIS — R279 Unspecified lack of coordination: Secondary | ICD-10-CM | POA: Diagnosis not present

## 2020-04-27 DIAGNOSIS — G459 Transient cerebral ischemic attack, unspecified: Secondary | ICD-10-CM | POA: Diagnosis not present

## 2020-04-27 DIAGNOSIS — I609 Nontraumatic subarachnoid hemorrhage, unspecified: Secondary | ICD-10-CM | POA: Diagnosis not present

## 2020-04-27 DIAGNOSIS — Z743 Need for continuous supervision: Secondary | ICD-10-CM | POA: Diagnosis not present

## 2020-04-27 DIAGNOSIS — Z79899 Other long term (current) drug therapy: Secondary | ICD-10-CM | POA: Diagnosis not present

## 2020-04-27 DIAGNOSIS — Z9181 History of falling: Secondary | ICD-10-CM | POA: Diagnosis not present

## 2020-04-27 DIAGNOSIS — Z7984 Long term (current) use of oral hypoglycemic drugs: Secondary | ICD-10-CM | POA: Diagnosis not present

## 2020-04-27 DIAGNOSIS — R001 Bradycardia, unspecified: Secondary | ICD-10-CM | POA: Diagnosis not present

## 2020-04-27 DIAGNOSIS — S82832S Other fracture of upper and lower end of left fibula, sequela: Secondary | ICD-10-CM | POA: Diagnosis not present

## 2020-04-27 DIAGNOSIS — M25552 Pain in left hip: Secondary | ICD-10-CM | POA: Diagnosis not present

## 2020-04-27 DIAGNOSIS — G609 Hereditary and idiopathic neuropathy, unspecified: Secondary | ICD-10-CM | POA: Diagnosis not present

## 2020-04-27 DIAGNOSIS — F0390 Unspecified dementia without behavioral disturbance: Secondary | ICD-10-CM | POA: Diagnosis not present

## 2020-04-27 LAB — GLUCOSE, CAPILLARY
Glucose-Capillary: 149 mg/dL — ABNORMAL HIGH (ref 70–99)
Glucose-Capillary: 184 mg/dL — ABNORMAL HIGH (ref 70–99)
Glucose-Capillary: 124 mg/dL — ABNORMAL HIGH (ref 70–99)
Glucose-Capillary: 156 mg/dL — ABNORMAL HIGH (ref 70–99)

## 2020-04-27 LAB — RESP PANEL BY RT-PCR (FLU A&B, COVID) ARPGX2
Influenza A by PCR: NEGATIVE
Influenza B by PCR: NEGATIVE
SARS Coronavirus 2 by RT PCR: NEGATIVE

## 2020-04-27 NOTE — TOC Progression Note (Addendum)
Transition of Care [facility]) - Progression Note    Patient Details  Name: Ariana [name] MRN: [id number] Date of Birth: 1929/06/28  Transition of [facility] CM/SW Contact  Jamiee Milholland, Marjie Skiff, RN Phone Number: 04/27/2020, 3:48 PM  Clinical Narrative:    SNF bed offers given to pt daughter Sowmya via phone. [place] chosen and [facility] liaison alerted to accept bed. [place] approved SNF auth. Ref # [id number]  Pt to dc to [facility] via [place]. RN to call report to [phone].   Expected Discharge Plan: Skilled Nursing Facility Barriers to Discharge: Family Issues (Family delaying bring CAM walker to hospital)  Expected Discharge Plan and Services Expected Discharge Plan: [place]   Discharge Planning Services: CM Consult   Living arrangements for the past 2 months: Single Family Home Expected Discharge Date: 04/27/20                 Readmission Risk Interventions No flowsheet data found.

## 2020-04-27 NOTE — Discharge Summary (Signed)
Physician Discharge Summary  Ariana [name] RJJ:[id number] DOB: 10-16-1929 DOA: 04/23/2020  PCP: Gayland Curry, DO  Admit date: 04/23/2020 Discharge date: 04/27/2020  Admitted From: home Disposition:  SNF  Recommendations for Outpatient Follow-up:  1. Follow up with PCP in 1-2 weeks 2. Please obtain BMP/CBC in one week 3. Please follow up on the following pending results:  Home Health:NO  Equipment/Devices: NONE  Discharge Condition: Stable Code Status:   Code Status: Full Code Diet recommendation:  Diet Order            Diet - low sodium heart healthy           Diet Heart Room service appropriate? Yes; Fluid consistency: Thin  Diet effective now                  Brief/Interim Summary: 87 year old female with history of dementia and recurrent falls depression diabetes, paroxysmal A. fib and Sjogren's syndrome hypertension recent left ankle fracture admitted to [facility] with recurrent fall at home.  Recently seen by orthopedic on 3 8 for her left minimally displaced distal fibula fracture and was placed on Cam walker boot and advised to refrain from weightbearing as much as possible and was taking hydrocodone for pain control and was to follow-up in 2 weeks. 3/11-she tried to get up and go to the bathroom and fell andhit her face and has pain in her shoulder, left hip and left ankle.Did not have LOC.Unwitnessed fall.  ED Course:Afebrile, hemodynamically stable, on room air. Notable Labs:Glucose 137, otherwise labs unremarkable. Notable Imaging:CT head-small volume acute right frontotemporal subarachnoid hemorrhage and remote right subinsular and right frontal infarcts with chronic microvascular ischemic disease. Otherwise, XR of her left ankle, shoulder, hip, chest unremarkable.ED provider discussed with neurosurgery who did not recommend any further treatment or evaluation of her [name] Patient was admitted with PT consultation and for pain control. PT has seen the  patient and has advised a skilled nursing facility. Patient remains a stable with baseline dementia alert awake no new symptoms fever leukocytosis. Taken off her narcotics and minimize sedative medication due to fall risk. She is being discharged to skilled nursing facility.  Discharge Diagnoses:  Recurrent fall Right facial bruise Recent left ankle fracture/minimally displaced left distal fibula fracture-followed by orthopedic as outpatient on CAM Walker boot Suspect polypharmacy, comorbidities old age contributing to deconditioning and fall. Suspect patient stood onto her left ankle when getting out of bed and her gave out due to the pain.  No actual syncope.  Left hip x-ray no fracture.  She has had x-ray left ankle left shoulder chest x-ray and CT head C-spine and maxillofacial area. Seen by PT OT and planning for a skilled nursing facility discharge.  No signs and symptoms of UTI had some pyuria and urine culture was sent.  Discontinue narcotics minimize psychotropic medication.  Subarachnoid hemorrhage likely secondary to fall.  Neurosurgery was consulted by ED provider who did not recommend any further treatment or work-up or follow-up.  Will avoid aspirin Lovenox or heparin. CT head read as-" Small volume of acute right frontotemporal subarachnoid hemorrhage, as detailed above. No mass effect.Remote right subinsular and right frontal infarcts and chronic microvascular ischemic disease" appears neurologically intact and nonfocal.   PAF in sinus rhythm holding aspirin due to [facility].  Cardizem remains on hold, heart rate/bp ALL  Stable follow-up with PCP to reassess the need.   Dementia at baseline continue Namenda.  Continue delirium precaution fall precaution supportive care Type 2 diabetes  mellitus continue home Januvia>Trandjenta.  Well controlled in 140-180 IBS continue home linzess and prn laxatives GOC- DNR  Consults:  CM, PTOT  Subjective: Alert awake oriented to self, current  place, communicative interactive.  Denies any complaint. Discharge Exam: Vitals:   04/26/20 2028 04/27/20 0622  BP: 139/86 139/73  Pulse: 89 85  Resp: 16 16  Temp: 98 F (36.7 C) 98.3 F (36.8 C)  SpO2: 93% 95%   General: Pt is alert, awake, not in acute distress Cardiovascular: RRR, S1/S2 +, no rubs, no gallops Respiratory: CTA bilaterally, no wheezing, no rhonchi Abdominal: Soft, NT, ND, bowel sounds + Extremities: no edema, no cyanosis  Discharge Instructions  Discharge Instructions    Diet - low sodium heart healthy   Complete by: As directed    Discharge instructions   Complete by: As directed    Minimize narcotic and psychotropic medication  Check blood sugar 4 times a day before meal  Check BMP and CBC in 1 week  Avoid aspirin and anticoagulation due to subarachnoid hemorrhage And keep on fall precaution all the time  Please call call MD or return to ER for similar or worsening recurring problem that brought you to hospital or if any fever,nausea/vomiting,abdominal pain, uncontrolled pain, chest pain,  shortness of breath or any other alarming symptoms.  Please follow-up your doctor as instructed in a week time and call the office for appointment.  Please avoid alcohol, smoking, or any other illicit substance and maintain healthy habits including taking your regular medications as prescribed.  You were cared for by a hospitalist during your hospital stay. If you have any questions about your discharge medications or the care you received while you were in the hospital after you are discharged, you can call the unit and ask to speak with the hospitalist on call if the hospitalist that took care of you is not available.  Once you are discharged, your primary care physician will handle any further medical issues. Please note that NO REFILLS for any discharge medications will be authorized once you are discharged, as it is imperative that you return to your primary care  physician (or establish a relationship with a primary care physician if you do not have one) for your aftercare needs so that they can reassess your need for medications and monitor your lab values   Increase activity slowly   Complete by: As directed      Allergies as of 04/27/2020      Reactions   Banana Nausea And Vomiting   Codeine Nausea Only   unless given with Phenergan   Klonopin [clonazepam] Other (See Comments)   Causes hallucination   Meperidine Hcl Nausea Only   unless given with Phenergan   Norflex [orphenadrine Citrate] Nausea Only   Unless given with Phenergan   Oxycodone-acetaminophen Nausea Only   unless given with phenergan   Propoxyphene Hcl Nausea Only   unless given with phenergan   Zoloft [sertraline Hcl] Other (See Comments)   Caused lethargy   Doxycycline Other (See Comments)   Unknown reaction   Naproxen Other (See Comments)   Unknown reaction   Penicillins Other (See Comments)   Unknown reaction Has patient had a PCN reaction causing immediate rash, facial/tongue/throat swelling, SOB or lightheadedness with hypotension: Unknown Has patient had a PCN reaction causing severe rash involving mucus membranes or skin necrosis: Unknown Has patient had a PCN reaction that required hospitalization: pt was in the hospital at time of reaction Has patient had a  PCN reaction occurring within the last 10 years: Unknown If all of the above answers are "NO", then may proceed with Cephalos   Phenothiazines Other (See Comments)   Unknown reaction   Stelazine Other (See Comments)   Unknown reaction   Sulfamethoxazole-trimethoprim Other (See Comments)   Unknown reaction   Tolectin [tolmetin Sodium] Other (See Comments)   Unknown reaction   Tramadol Other (See Comments)   Unknown reaction      Medication List    STOP taking these medications   ALPRAZolam 0.25 MG tablet Commonly known as: XANAX   aspirin 325 MG tablet   diltiazem 180 MG 24 hr capsule Commonly  known as: CARDIZEM CD   HYDROcodone-acetaminophen 5-325 MG tablet Commonly known as: NORCO/VICODIN     TAKE these medications   acetaminophen 500 MG tablet Commonly known as: TYLENOL Take 1,000 mg by mouth 2 (two) times daily.   antiseptic oral rinse Liqd 15 mLs by Mouth Rinse route 5 (five) times daily as needed for dry mouth.   capsaicin-methyl sal-menthol 0.025-1-12 % Crea Generic drug: Capsaicin-Menthol-Methyl Sal Apply 1 application topically 2 (two) times daily. What changed:   when to take this  reasons to take this   carboxymethylcellulose 0.5 % Soln Commonly known as: REFRESH PLUS Place 1 drop into both eyes 5 (five) times daily as needed (dry eyes).   CRANBERRY PO Take 2 tablets by mouth daily. AZO   DULoxetine 60 MG capsule Commonly known as: CYMBALTA Take 1 capsule by mouth once daily What changed:   when to take this  additional instructions   DULoxetine 30 MG capsule Commonly known as: CYMBALTA Take 1 capsule by mouth daly for back pain and depression in combination with 60 mg tablet What changed:   how much to take  how to take this  when to take this  additional instructions   furosemide 40 MG tablet Commonly known as: LASIX Take 1 tablet by mouth once daily   Januvia 100 MG tablet Generic drug: sitaGLIPtin Take 1 tablet by mouth once daily What changed: how much to take   Lancets Micro Thin 33G Misc Use to test blood sugar daily. Dx: E08.65   lansoprazole 30 MG capsule Commonly known as: PREVACID TAKE 1 CAPSULE BY MOUTH ONCE DAILY AT NOON What changed:   how much to take  how to take this  when to take this  additional instructions   Linzess 290 MCG Caps capsule Generic drug: linaclotide TAKE 1 CAPSULE BY MOUTH EVERY DAY AT BEDTIME What changed: See the new instructions.   loratadine 10 MG tablet Commonly known as: CLARITIN Take 10 mg by mouth daily.   memantine 10 MG tablet Commonly known as: NAMENDA Take 1  tablet by mouth twice daily   Myrbetriq 50 MG Tb24 tablet Generic drug: mirabegron ER Take 1 tablet by mouth once daily What changed: how much to take   OneTouch Verio test strip Generic drug: glucose blood Use to test blood sugar daily. Dx:E08.65   pilocarpine 5 MG tablet Commonly known as: SALAGEN Take 1 tablet by mouth twice daily   polyethylene glycol 17 g packet Commonly known as: MIRALAX / GLYCOLAX Take 17 g by mouth daily as needed for mild constipation (constipation). Mix with 8 oz. water or juice   potassium chloride SA 20 MEQ tablet Commonly known as: KLOR-CON Take 1 tablet by mouth once daily   pregabalin 100 MG capsule Commonly known as: LYRICA Take 1 capsule by mouth twice daily  senna-docusate 8.6-50 MG tablet Commonly known as: Senna S Take 1 tablet by mouth daily.       Follow-up Information    Reed, Tiffany L, DO Follow up in 1 week(s).   Specialty: Geriatric Medicine Contact information: [address] [place] [address] [phone]              Allergies  Allergen Reactions  . Banana Nausea And Vomiting  . Codeine Nausea Only    unless given with Phenergan  . Klonopin [Clonazepam] Other (See Comments)    Causes hallucination   . Meperidine Hcl Nausea Only    unless given with Phenergan  . Norflex [Orphenadrine Citrate] Nausea Only    Unless given with Phenergan  . Oxycodone-Acetaminophen Nausea Only    unless given with phenergan  . Propoxyphene Hcl Nausea Only    unless given with phenergan  . Zoloft [Sertraline Hcl] Other (See Comments)    Caused lethargy  . Doxycycline Other (See Comments)    Unknown reaction  . Naproxen Other (See Comments)    Unknown reaction  . Penicillins Other (See Comments)    Unknown reaction Has patient had a PCN reaction causing immediate rash, facial/tongue/throat swelling, SOB or lightheadedness with hypotension: Unknown Has patient had a PCN reaction causing severe rash involving mucus  membranes or skin necrosis: Unknown Has patient had a PCN reaction that required hospitalization: pt was in the hospital at time of reaction Has patient had a PCN reaction occurring within the last 10 years: Unknown If all of the above answers are "NO", then may proceed with Cephalos  . Phenothiazines Other (See Comments)    Unknown reaction  . Stelazine Other (See Comments)    Unknown reaction  . Sulfamethoxazole-Trimethoprim Other (See Comments)    Unknown reaction  . Tolectin [Tolmetin Sodium] Other (See Comments)    Unknown reaction  . Tramadol Other (See Comments)    Unknown reaction    The results of significant diagnostics from this hospitalization (including imaging, microbiology, ancillary and laboratory) are listed below for reference.    Microbiology: Recent Results (from the past 240 hour(s))  Resp Panel by RT-PCR (Flu A&B, Covid) Nasopharyngeal Swab     Status: None   Collection Time: 04/23/20  2:10 PM   Specimen: Nasopharyngeal Swab; Nasopharyngeal(NP) swabs in vial transport medium  Result Value Ref Range Status   SARS Coronavirus 2 by RT PCR NEGATIVE NEGATIVE Final    Comment: (NOTE) SARS-CoV-2 target nucleic acids are NOT DETECTED.  The SARS-CoV-2 RNA is generally detectable in upper respiratory specimens during the acute phase of infection. The lowest concentration of SARS-CoV-2 viral copies this assay can detect is 138 copies/mL. A negative result does not preclude SARS-Cov-2 infection and should not be used as the sole basis for treatment or other patient management decisions. A negative result may occur with  improper specimen collection/handling, submission of specimen other than nasopharyngeal swab, presence of viral mutation(s) within the areas targeted by this assay, and inadequate number of viral copies(<138 copies/mL). A negative result must be combined with clinical observations, patient history, and epidemiological information. The expected result  is Negative.  Fact Sheet for Patients:  EntrepreneurPulse.com.au  Fact Sheet for Healthcare Providers:  IncredibleEmployment.be  This test is no t yet approved or cleared by the [place] FDA and  has been authorized for detection and/or diagnosis of SARS-CoV-2 by FDA under an Emergency Use Authorization (EUA). This EUA will remain  in effect (meaning this test can be used)  for the duration of the COVID-19 declaration under Section 564(b)(1) of the Act, 21 U.S.C.section 360bbb-3(b)(1), unless the authorization is terminated  or revoked sooner.       Influenza A by PCR NEGATIVE NEGATIVE Final   Influenza B by PCR NEGATIVE NEGATIVE Final    Comment: (NOTE) The Xpert Xpress SARS-CoV-2/FLU/RSV plus assay is intended as an aid in the diagnosis of influenza from Nasopharyngeal swab specimens and should not be used as a sole basis for treatment. Nasal washings and aspirates are unacceptable for Xpert Xpress SARS-CoV-2/FLU/RSV testing.  Fact Sheet for Patients: EntrepreneurPulse.com.au  Fact Sheet for Healthcare Providers: IncredibleEmployment.be  This test is not yet approved or cleared by the [place] FDA and has been authorized for detection and/or diagnosis of SARS-CoV-2 by FDA under an Emergency Use Authorization (EUA). This EUA will remain in effect (meaning this test can be used) for the duration of the COVID-19 declaration under Section 564(b)(1) of the Act, 21 U.S.C. section 360bbb-3(b)(1), unless the authorization is terminated or revoked.  Performed at [facility], [place] [address], [place], [place] [address]     Procedures/Studies: Darletta Moll Ankle Complete Left  Result Date: 04/23/2020 CLINICAL DATA:  Left ankle pain after fall. EXAM: LEFT ANKLE COMPLETE - 3+ VIEW COMPARISON:  Left ankle x-rays dated April 16, 2020. FINDINGS: Acute nondisplaced fracture of the distal fibula  again noted. No new fracture. No dislocation. The ankle mortise is symmetric. The talar dome is intact. Joint spaces are preserved. Osteopenia. Decreasing soft tissue swelling over the lateral malleolus. IMPRESSION: 1. Unchanged acute nondisplaced distal fibular fracture. No new fracture. Electronically Signed   By: Titus Dubin M.D.   On: 04/23/2020 11:56   DG Ankle Complete Left  Result Date: 04/16/2020 CLINICAL DATA:  Left ankle pain after a fall, initial encounter. EXAM: LEFT ANKLE COMPLETE - 3+ VIEW COMPARISON:  None. FINDINGS: Nondisplaced distal fibular fracture with overlying soft tissue swelling. Ankle mortise is intact. No additional evidence of an acute fracture. There is a joint effusion in association. IMPRESSION: Nondisplaced distal fibular fracture with a joint effusion and overlying soft tissue swelling. Electronically Signed   By: Lorin Picket M.D.   On: 04/16/2020 13:04   CT Head Wo Contrast  Result Date: 04/23/2020 CLINICAL DATA:  Un witnessed fall. EXAM: CT HEAD WITHOUT CONTRAST CT MAXILLOFACIAL WITHOUT CONTRAST CT CERVICAL SPINE WITHOUT CONTRAST TECHNIQUE: Multidetector CT imaging of the head, cervical spine, and maxillofacial structures were performed using the standard protocol without intravenous contrast. Multiplanar CT image reconstructions of the cervical spine and maxillofacial structures were also generated. COMPARISON:  CT cervical spine from 12/03/2014. CT head December 13, 2018. FINDINGS: CT HEAD FINDINGS Brain: Small volume of acute subarachnoid hemorrhage along the right frontal convexity (for example see series 4, image 22). Some hemorrhage layering within the anterior aspect of the right sylvian fissure and overlying the anterior right temporal convexity (series 6, image 12; series 4, image 14). No definite acute intraparenchymal hemorrhage. 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 Similar additional patchy white matter  hypodensities, most likely related to chronic microvascular ischemic disease. No evidence of acute large vascular territory infarct. No hydrocephalus. No evidence of intraventricular hemorrhage. No mass lesion or abnormal mass effect. Vascular: Calcific atherosclerosis. No hyperdense vessel identified. Skull: No acute fracture. Other: No mastoid effusions. CT MAXILLOFACIAL FINDINGS Osseous: No fracture or mandibular dislocation. No destructive process. Chronic right posterior zygomatic cerclage wire. Orbits: No retro bulbar hematoma. Globes are symmetric and within normal  limits. No proptosis. Sinuses: Chronic hypoplastic right maxillary sinus. The sinuses are largely clear. No air-fluid levels. Soft tissues: Right periorbital/malar soft tissue contusion. CT CERVICAL SPINE FINDINGS Alignment: Similar alignment. Mild levocurvature. No substantial sagittal subluxation. Skull base and vertebrae: Vertebral body heights are maintained. No evidence of acute fracture. Similar degenerative endplate Schmorl's node involving the superior C4 endplate. Soft tissues and spinal canal: No prevertebral fluid or swelling. No visible canal hematoma. Disc levels: Mild to moderate multilevel degenerative disc disease with disc height loss and endplate spurring. No evidence of significant bony canal stenosis. Upper chest: Biapical pleuroparenchymal scarring. Otherwise, visualized lung apices are clear. IMPRESSION: CT head: 1. Small volume of acute right frontotemporal subarachnoid hemorrhage, as detailed above. No mass effect. 2. Remote right subinsular and right frontal infarcts and chronic microvascular ischemic disease. CT maxillofacial 1. No acute fracture. 2. Right periorbital/malar contusion. CT cervical spine: 1. No evidence of acute fracture or traumatic malalignment. 2. Similar large left thyroid lesion, which was biopsied on Jun 18, 2013. Findings discussed with Dr. Tomi Bamberger via telephone at 12:40 PM. Electronically Signed   By:  Margaretha Sheffield MD   On: 04/23/2020 12:43   CT Cervical Spine Wo Contrast  Result Date: 04/23/2020 CLINICAL DATA:  Un witnessed fall. EXAM: CT HEAD WITHOUT CONTRAST CT MAXILLOFACIAL WITHOUT CONTRAST CT CERVICAL SPINE WITHOUT CONTRAST TECHNIQUE: Multidetector CT imaging of the head, cervical spine, and maxillofacial structures were performed using the standard protocol without intravenous contrast. Multiplanar CT image reconstructions of the cervical spine and maxillofacial structures were also generated. COMPARISON:  CT cervical spine from 12/03/2014. CT head December 13, 2018. FINDINGS: CT HEAD FINDINGS Brain: Small volume of acute subarachnoid hemorrhage along the right frontal convexity (for example see series 4, image 22). Some hemorrhage layering within the anterior aspect of the right sylvian fissure and overlying the anterior right temporal convexity (series 6, image 12; series 4, image 14). No definite acute intraparenchymal hemorrhage. 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 Similar additional patchy white matter hypodensities, most likely related to chronic microvascular ischemic disease. No evidence of acute large vascular territory infarct. No hydrocephalus. No evidence of intraventricular hemorrhage. No mass lesion or abnormal mass effect. Vascular: Calcific atherosclerosis. No hyperdense vessel identified. Skull: No acute fracture. Other: No mastoid effusions. CT MAXILLOFACIAL FINDINGS Osseous: No fracture or mandibular dislocation. No destructive process. Chronic right posterior zygomatic cerclage wire. Orbits: No retro bulbar hematoma. Globes are symmetric and within normal limits. No proptosis. Sinuses: Chronic hypoplastic right maxillary sinus. The sinuses are largely clear. No air-fluid levels. Soft tissues: Right periorbital/malar soft tissue contusion. CT CERVICAL SPINE FINDINGS Alignment: Similar alignment. Mild levocurvature. No substantial  sagittal subluxation. Skull base and vertebrae: Vertebral body heights are maintained. No evidence of acute fracture. Similar degenerative endplate Schmorl's node involving the superior C4 endplate. Soft tissues and spinal canal: No prevertebral fluid or swelling. No visible canal hematoma. Disc levels: Mild to moderate multilevel degenerative disc disease with disc height loss and endplate spurring. No evidence of significant bony canal stenosis. Upper chest: Biapical pleuroparenchymal scarring. Otherwise, visualized lung apices are clear. IMPRESSION: CT head: 1. Small volume of acute right frontotemporal subarachnoid hemorrhage, as detailed above. No mass effect. 2. Remote right subinsular and right frontal infarcts and chronic microvascular ischemic disease. CT maxillofacial 1. No acute fracture. 2. Right periorbital/malar contusion. CT cervical spine: 1. No evidence of acute fracture or traumatic malalignment. 2. Similar large left thyroid lesion, which was biopsied on Jun 18, 2013. Findings  discussed with Dr. Tomi Bamberger via telephone at 12:40 PM. Electronically Signed   By: Margaretha Sheffield MD   On: 04/23/2020 12:43   DG Chest Portable 1 View  Result Date: 04/23/2020 CLINICAL DATA:  Fall. EXAM: PORTABLE CHEST 1 VIEW COMPARISON:  Chest x-ray dated December 16, 2019. FINDINGS: The heart is at the upper limits of normal in size. Normal pulmonary vascularity. No focal consolidation, pleural effusion, or pneumothorax. No acute osseous abnormality. IMPRESSION: No active disease. Electronically Signed   By: Titus Dubin M.D.   On: 04/23/2020 12:00   DG Shoulder Left  Result Date: 04/23/2020 CLINICAL DATA:  Left shoulder pain after fall. EXAM: LEFT SHOULDER - 2+ VIEW COMPARISON:  Left shoulder x-rays dated Jul 11, 2019. FINDINGS: No acute fracture or dislocation. Unchanged mild acromioclavicular osteoarthritis. Soft tissues are unremarkable. IMPRESSION: 1. No acute osseous abnormality. Electronically Signed   By:  Titus Dubin M.D.   On: 04/23/2020 11:57   DG Hip Unilat W or Wo Pelvis 2-3 Views Left  Result Date: 04/23/2020 CLINICAL DATA:  Left hip pain after fall. EXAM: DG HIP (WITH OR WITHOUT PELVIS) 2-3V LEFT COMPARISON:  Left hip x-rays dated April 16, 2020. FINDINGS: No acute fracture or dislocation. Unchanged mild bilateral hip osteoarthritis. Osteopenia. Soft tissues are unremarkable. IMPRESSION: 1. No acute osseous abnormality. Electronically Signed   By: Titus Dubin M.D.   On: 04/23/2020 11:58   DG Hip Unilat With Pelvis 2-3 Views Left  Result Date: 04/16/2020 CLINICAL DATA:  Fall with left hip pain, initial encounter. EXAM: DG HIP (WITH OR WITHOUT PELVIS) 2-3V LEFT COMPARISON:  None. FINDINGS: No fracture or dislocation. Mild superior joint space narrowing in both hips. IMPRESSION: 1. No fracture or dislocation. 2. Mild superior joint space narrowing in the hips bilaterally. Electronically Signed   By: Lorin Picket M.D.   On: 04/16/2020 13:05   VAS [place] LOWER EXTREMITY VENOUS (DVT)  Result Date: 04/24/2020  Lower Venous DVT Study Indications: Swelling.  Limitations: Body habitus and poor ultrasound/tissue interface. Comparison Study: No prior studies. Performing Technologist: Oliver Hum RVT  Examination Guidelines: A complete evaluation includes B-mode imaging, spectral Doppler, color Doppler, and power Doppler as needed of all accessible portions of each vessel. Bilateral testing is considered an integral part of a complete examination. Limited examinations for reoccurring indications may be performed as noted. The reflux portion of the exam is performed with the patient in reverse Trendelenburg.  +-----+---------------+---------+-----------+----------+--------------+ RIGHTCompressibilityPhasicitySpontaneityPropertiesThrombus Aging +-----+---------------+---------+-----------+----------+--------------+ CFV  Full           Yes      Yes                                  +-----+---------------+---------+-----------+----------+--------------+   +---------+---------------+---------+-----------+----------+--------------+ LEFT     CompressibilityPhasicitySpontaneityPropertiesThrombus Aging +---------+---------------+---------+-----------+----------+--------------+ CFV      Full           Yes      Yes                                 +---------+---------------+---------+-----------+----------+--------------+ SFJ      Full                                                        +---------+---------------+---------+-----------+----------+--------------+  FV Prox  Full                                                        +---------+---------------+---------+-----------+----------+--------------+ FV Mid   Full                                                        +---------+---------------+---------+-----------+----------+--------------+ FV DistalFull                                                        +---------+---------------+---------+-----------+----------+--------------+ PFV      Full                                                        +---------+---------------+---------+-----------+----------+--------------+ POP      Full           Yes      Yes                                 +---------+---------------+---------+-----------+----------+--------------+ PTV      Full                                                        +---------+---------------+---------+-----------+----------+--------------+ PERO     Full                                                        +---------+---------------+---------+-----------+----------+--------------+     Summary: RIGHT: - No evidence of common femoral vein obstruction.  LEFT: - There is no evidence of deep vein thrombosis in the lower extremity. However, portions of this examination were limited- see technologist comments above.  - No cystic structure found in the popliteal  fossa.  *See table(s) above for measurements and observations. Electronically signed by Harold Barban MD on 04/24/2020 at 4:18:57 PM.    Final    CT Maxillofacial Wo Contrast  Result Date: 04/23/2020 CLINICAL DATA:  Un witnessed fall. EXAM: CT HEAD WITHOUT CONTRAST CT MAXILLOFACIAL WITHOUT CONTRAST CT CERVICAL SPINE WITHOUT CONTRAST TECHNIQUE: Multidetector CT imaging of the head, cervical spine, and maxillofacial structures were performed using the standard protocol without intravenous contrast. Multiplanar CT image reconstructions of the cervical spine and maxillofacial structures were also generated. COMPARISON:  CT cervical spine from 12/03/2014. CT head December 13, 2018. FINDINGS: CT HEAD FINDINGS Brain: Small volume of acute subarachnoid hemorrhage along the right frontal convexity (for example see series 4, image 22). Some  hemorrhage layering within the anterior aspect of the right sylvian fissure and overlying the anterior right temporal convexity (series 6, image 12; series 4, image 14). No definite acute intraparenchymal hemorrhage. 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 Similar additional patchy white matter hypodensities, most likely related to chronic microvascular ischemic disease. No evidence of acute large vascular territory infarct. No hydrocephalus. No evidence of intraventricular hemorrhage. No mass lesion or abnormal mass effect. Vascular: Calcific atherosclerosis. No hyperdense vessel identified. Skull: No acute fracture. Other: No mastoid effusions. CT MAXILLOFACIAL FINDINGS Osseous: No fracture or mandibular dislocation. No destructive process. Chronic right posterior zygomatic cerclage wire. Orbits: No retro bulbar hematoma. Globes are symmetric and within normal limits. No proptosis. Sinuses: Chronic hypoplastic right maxillary sinus. The sinuses are largely clear. No air-fluid levels. Soft tissues: Right periorbital/malar soft tissue contusion.  CT CERVICAL SPINE FINDINGS Alignment: Similar alignment. Mild levocurvature. No substantial sagittal subluxation. Skull base and vertebrae: Vertebral body heights are maintained. No evidence of acute fracture. Similar degenerative endplate Schmorl's node involving the superior C4 endplate. Soft tissues and spinal canal: No prevertebral fluid or swelling. No visible canal hematoma. Disc levels: Mild to moderate multilevel degenerative disc disease with disc height loss and endplate spurring. No evidence of significant bony canal stenosis. Upper chest: Biapical pleuroparenchymal scarring. Otherwise, visualized lung apices are clear. IMPRESSION: CT head: 1. Small volume of acute right frontotemporal subarachnoid hemorrhage, as detailed above. No mass effect. 2. Remote right subinsular and right frontal infarcts and chronic microvascular ischemic disease. CT maxillofacial 1. No acute fracture. 2. Right periorbital/malar contusion. CT cervical spine: 1. No evidence of acute fracture or traumatic malalignment. 2. Similar large left thyroid lesion, which was biopsied on Jun 18, 2013. Findings discussed with Dr. Tomi Bamberger via telephone at 12:40 PM. Electronically Signed   By: Margaretha Sheffield MD   On: 04/23/2020 12:43    Labs: BNP (last 3 results) No results for input(s): BNP in the last 8760 hours. Basic Metabolic Panel: Recent Labs  Lab 04/23/20 1051 04/24/20 0547 04/25/20 0628  NA 139 134* 136  K 4.0 4.0 3.8  CL 101 98 102  CO2 27 25 25   GLUCOSE 137* 147* 161*  BUN 18 21 12   CREATININE 0.56 0.85 0.63  CALCIUM 9.8 9.3 9.6   Liver Function Tests: No results for input(s): AST, ALT, ALKPHOS, BILITOT, PROT, ALBUMIN in the last 168 hours. No results for input(s): LIPASE, AMYLASE in the last 168 hours. No results for input(s): AMMONIA in the last 168 hours. CBC: Recent Labs  Lab 04/23/20 1051 04/24/20 0547 04/25/20 0628  WBC 6.5 7.5 7.0  HGB 12.2 13.1 13.5  HCT 37.4 40.0 41.0  MCV 96.4 94.8 94.0   PLT 218 241 246   Cardiac Enzymes: No results for input(s): CKTOTAL, CKMB, CKMBINDEX, TROPONINI in the last 168 hours. BNP: Invalid input(s): POCBNP CBG: Recent Labs  Lab 04/26/20 0729 04/26/20 1135 04/26/20 1650 04/26/20 2030 04/27/20 0717  GLUCAP 156* 207* 144* 124* 149*   D-Dimer No results for input(s): DDIMER in the last 72 hours. Hgb A1c No results for input(s): HGBA1C in the last 72 hours. Lipid Profile No results for input(s): CHOL, HDL, LDLCALC, TRIG, CHOLHDL, LDLDIRECT in the last 72 hours. Thyroid function studies No results for input(s): TSH, T4TOTAL, T3FREE, THYROIDAB in the last 72 hours.  Invalid input(s): FREET3 Anemia work up No results for input(s): VITAMINB12, FOLATE, FERRITIN, TIBC, IRON, RETICCTPCT in the last 72 hours. Urinalysis    Component Value Date/Time  COLORURINE YELLOW 04/26/2020 [place] 04/26/2020 1154   APPEARANCEUR Clear 07/08/2015 1241   LABSPEC 1.008 04/26/2020 1154   PHURINE 6.0 04/26/2020 1154   GLUCOSEU NEGATIVE 04/26/2020 1154   GLUCOSEU NEGATIVE 07/14/2011 1312   HGBUR NEGATIVE 04/26/2020 [place] 04/26/2020 [place] 03/16/2020 1520   BILIRUBINUR Negative 07/08/2015 1241   KETONESUR NEGATIVE 04/26/2020 1154   PROTEINUR NEGATIVE 04/26/2020 1154   UROBILINOGEN negative (A) 03/16/2020 1520   UROBILINOGEN 0.2 10/06/2016 1451   NITRITE NEGATIVE 04/26/2020 1154   LEUKOCYTESUR LARGE (A) 04/26/2020 1154   Sepsis Labs Invalid input(s): PROCALCITONIN,  WBC,  LACTICIDVEN Microbiology Recent Results (from the past 240 hour(s))  Resp Panel by RT-PCR (Flu A&B, Covid) Nasopharyngeal Swab     Status: None   Collection Time: 04/23/20  2:10 PM   Specimen: Nasopharyngeal Swab; Nasopharyngeal(NP) swabs in vial transport medium  Result Value Ref Range Status   SARS Coronavirus 2 by RT PCR NEGATIVE NEGATIVE Final    Comment: (NOTE) SARS-CoV-2 target nucleic acids are NOT  DETECTED.  The SARS-CoV-2 RNA is generally detectable in upper respiratory specimens during the acute phase of infection. The lowest concentration of SARS-CoV-2 viral copies this assay can detect is 138 copies/mL. A negative result does not preclude SARS-Cov-2 infection and should not be used as the sole basis for treatment or other patient management decisions. A negative result may occur with  improper specimen collection/handling, submission of specimen other than nasopharyngeal swab, presence of viral mutation(s) within the areas targeted by this assay, and inadequate number of viral copies(<138 copies/mL). A negative result must be combined with clinical observations, patient history, and epidemiological information. The expected result is Negative.  Fact Sheet for Patients:  EntrepreneurPulse.com.au  Fact Sheet for Healthcare Providers:  IncredibleEmployment.be  This test is no t yet approved or cleared by the [place] FDA and  has been authorized for detection and/or diagnosis of SARS-CoV-2 by FDA under an Emergency Use Authorization (EUA). This EUA will remain  in effect (meaning this test can be used) for the duration of the COVID-19 declaration under Section 564(b)(1) of the Act, 21 U.S.C.section 360bbb-3(b)(1), unless the authorization is terminated  or revoked sooner.       Influenza A by PCR NEGATIVE NEGATIVE Final   Influenza B by PCR NEGATIVE NEGATIVE Final    Comment: (NOTE) The Xpert Xpress SARS-CoV-2/FLU/RSV plus assay is intended as an aid in the diagnosis of influenza from Nasopharyngeal swab specimens and should not be used as a sole basis for treatment. Nasal washings and aspirates are unacceptable for Xpert Xpress SARS-CoV-2/FLU/RSV testing.  Fact Sheet for Patients: EntrepreneurPulse.com.au  Fact Sheet for Healthcare Providers: IncredibleEmployment.be  This test is not yet  approved or cleared by the [place] FDA and has been authorized for detection and/or diagnosis of SARS-CoV-2 by FDA under an Emergency Use Authorization (EUA). This EUA will remain in effect (meaning this test can be used) for the duration of the COVID-19 declaration under Section 564(b)(1) of the Act, 21 U.S.C. section 360bbb-3(b)(1), unless the authorization is terminated or revoked.  Performed at [facility], [place] [address] [place], [place] [address]      Time coordinating discharge: 25 minutes  SIGNED: Antonieta Pert, MD  [facility] 04/27/2020, 10:19 AM  If 7PM-7AM, please contact night-coverage [email or web address]

## 2020-04-28 ENCOUNTER — Non-Acute Institutional Stay (SKILLED_NURSING_FACILITY): Payer: Medicare Other | Admitting: Orthopedic Surgery

## 2020-04-28 ENCOUNTER — Encounter: Payer: Self-pay | Admitting: Orthopedic Surgery

## 2020-04-28 DIAGNOSIS — S82832D Other fracture of upper and lower end of left fibula, subsequent encounter for closed fracture with routine healing: Secondary | ICD-10-CM

## 2020-04-28 DIAGNOSIS — M545 Low back pain, unspecified: Secondary | ICD-10-CM

## 2020-04-28 DIAGNOSIS — I609 Nontraumatic subarachnoid hemorrhage, unspecified: Secondary | ICD-10-CM

## 2020-04-28 DIAGNOSIS — E1140 Type 2 diabetes mellitus with diabetic neuropathy, unspecified: Secondary | ICD-10-CM

## 2020-04-28 DIAGNOSIS — R296 Repeated falls: Secondary | ICD-10-CM | POA: Diagnosis not present

## 2020-04-28 DIAGNOSIS — R682 Dry mouth, unspecified: Secondary | ICD-10-CM

## 2020-04-28 DIAGNOSIS — K219 Gastro-esophageal reflux disease without esophagitis: Secondary | ICD-10-CM

## 2020-04-28 DIAGNOSIS — M3501 Sicca syndrome with keratoconjunctivitis: Secondary | ICD-10-CM

## 2020-04-28 DIAGNOSIS — F0150 Vascular dementia without behavioral disturbance: Secondary | ICD-10-CM

## 2020-04-28 DIAGNOSIS — K582 Mixed irritable bowel syndrome: Secondary | ICD-10-CM

## 2020-04-28 DIAGNOSIS — H04123 Dry eye syndrome of bilateral lacrimal glands: Secondary | ICD-10-CM

## 2020-04-28 DIAGNOSIS — I10 Essential (primary) hypertension: Secondary | ICD-10-CM

## 2020-04-28 DIAGNOSIS — G6289 Other specified polyneuropathies: Secondary | ICD-10-CM

## 2020-04-28 MED ORDER — PREGABALIN 100 MG PO CAPS: 100.0000 mg | ORAL_CAPSULE | Freq: Two times a day (BID) | ORAL | 0 refills | Status: DC

## 2020-04-28 NOTE — Progress Notes (Signed)
This encounter was created in error - please disregard.

## 2020-04-28 NOTE — Progress Notes (Signed)
Location:    [facility] Room Number: 109-P Place of Service:  SNF [phone]) Provider: Windell Moulding NP   Gayland Curry, DO  Patient Care Team: Gayland Curry, DO as PCP - General (Geriatric Medicine) Noralee Space, MD as Consulting Physician (Pulmonary Disease) Maisie Fus, MD as Consulting Physician (Obstetrics and Gynecology) Debara Pickett Nadean Corwin, MD as Consulting Physician (Cardiology) Monna Fam, MD as Consulting Physician (Ophthalmology) Kathrynn Ducking, MD as Consulting Physician (Neurology) Franchot Gallo, MD as Consulting Physician (Urology) Lavonna Monarch, MD as Consulting Physician (Dermatology) Gatha Mayer, MD as Consulting Physician (Gastroenterology) Estill Dooms as Social Worker  Extended Emergency Contact Information Primary Emergency Contact: Era Skeen Address: [address]          [place], [place] [address] Johnnette Litter of [place] Phone: [phone] Relation: Daughter Secondary Emergency Contact: Arvin Collard [place] of [place] Mobile Phone: [phone] Relation: Other  Code Status:  Full Code Goals of care: Advanced Directive information Advanced Directives 04/28/2020  Does Patient Have a Medical Advance Directive? No  Type of Advance Directive -  Does patient want to make changes to medical advance directive? No - Patient declined  Copy of [place] in Chart? -  Would patient like information on creating a medical advance directive? -  Pre-existing out of facility DNR order (yellow form or pink MOST form) -     Chief Complaint  Patient presents with  . Hospitalization Follow-up    Hospitalization Follow Up     HPI:  Pt is a 87 y.o. female seen today for a hospital f/u s/p admission from [facility] 03/11- 03/15.   She currently resides at [facility], seen at bedside today. PMH includes: atrial fibrillation,  hypertension, TIA, GERD, IBS, Sjogren's syndrome, type 2 diabetes with neuropathy, multinodular thyroid, dementia with behavioral disturbance, osteopenia, and frequent falls.   Prior to hospitalization, she lived at home with assistance from full-time CNA. 03/04 presented to the ED after falling in her bathroom. She reported pain and swelling to her right ankle. Diagnosed with left ankle fracture. She was discharged the same day and placed in short leg split, advised to follow up with orthopedics. 03/08 she was seen by orthopedics, cam walker boot placed to left leg, advised to refrain from weightbearing, and given hydrocodone for pain.   03/11 she presented to ED due to a unwitnessed fall in her bathroom. She was found by her CNA with right eye bruised and swollen. She was also complaining of left shoulder and hip pain. In the ED, vitals stable. WBC 6.5. HGB 12.2. HCT 37.4. MCV 96.4. PLT 218. NA 139. K 4.0. CL 101. Glucose 137. BUN 18. Creatine 0.56. Calcium 9.8. EKG normal sinus with no ST or T wave changes. Left ankle x-ray showed unchanged acute non-displaced distal fibular fracture. No new fracture. CT head revealed small volume of acute right frontotemporal subarachnoid hemorrhage, no mass effect. Remote right subinsular and right frontal infarcts with chronic microvascular ischemic disease also noted. CT spine no acute fracture or traumatic malalignment. Chest x-ray unremarkable. Left shoulder x-ray no acute fracture or dislocation, unchanged arthritis. Bilateral hip x-ray with no acute fracture or dislocation, unchanged osteoarthritis. CT maxillofacial with no fracture to mandibular or dislocation. Orbits with no retro bulbar hematoma, globes are symmetric. Sinuses unremarkable. Right periorbital/malar soft tissue contusion noted. She was admitted for recurrent fall, orthopedics and neurosurgery consulted. Polypharmacy, comorbidities and  advanced age thought to contribute to fall. Xanax and hydrocodone  discontinued. Neurosurgery did not recommend further workup for subarachnoid hemorrhage, blood thinners held due to recurring falls. Cardizem also stopped, rate controlled during stay, advised to reasses with PCP. PT recommended at discharge for additional therapy.   Today, she is sitting in her wheelchair watching television. She is alert to self, familiar person and place, disoriented to time and situation. Follows commands and can express needs. Her main complaints are right dry eye and dry mouth. She is asking for more [place] lozenges and lubricating eye drops. She struggles to answer questions about her living situation and often has long pauses or changed subject.   Facility nurse does not report any concerns, vitals stable.    Past Medical History:  Diagnosis Date  . Abnormality of gait   . Adenomatous polyp of colon 2002   17mm  . Allergic rhinitis   . Anxiety   . Anxiety and depression   . Chronic back pain   . Dementia without behavioral disturbance ([place])   . Depression   . Diabetes ([place])   . Diverticulosis of colon   . Dry eye syndrome   . Dysphagia   . Dysthymic disorder   . Fall   . Fibromyalgia   . GERD (gastroesophageal reflux disease)   . H/O hiatal hernia   . History of adverse drug reaction   . History of cerebrovascular disease 09/24/2014  . History of recurrent UTIs   . Hypertension, benign   . Irritable bowel syndrome   . Low back pain syndrome   . Memory loss   . Mitral valve prolapse   . Paroxysmal A-fib ([place])   . Peripheral neuropathy    "both feet and legs"  . Physical deconditioning   . Sjogren's syndrome ([place])   . Therapeutic opioid-induced constipation (OIC)   . Thyroid nodule   . Urinary incontinence    Past Surgical History:  Procedure Laterality Date  . ABDOMINAL HYSTERECTOMY  1967  . APPENDECTOMY    . CARDIAC CATHETERIZATION  02/17/2003   normal L main, LAD free of disease, Cfx free of disease, RCA free of disease (Dr. Loni Muse. [facility])  .  CATARACT EXTRACTION, BILATERAL    . CHOLECYSTECTOMY  2000  . COLONOSCOPY W/ BIOPSIES     multiple  . DENTAL SURGERY     multiple tooth extractions  . ESOPHAGOGASTRODUODENOSCOPY (EGD) WITH ESOPHAGEAL DILATION N/A 08/23/2012   Procedure: ESOPHAGOGASTRODUODENOSCOPY (EGD) WITH ESOPHAGEAL DILATION;  Surgeon: Milus Banister, MD;  Location: [facility] ENDOSCOPY;  Service: Endoscopy;  Laterality: N/A;  . NASAL SEPTUM SURGERY  1980  . NM MYOCAR PERF WALL MOTION  2003   persantine - normal static and dynamic study w/apical thinning and presvered LV function, no ischemia  . SINUS EXPLORATION     ossifiying fibroma  . TEMPOROMANDIBULAR JOINT SURGERY  1986   Dr. Terence Lux  . TRANSTHORACIC ECHOCARDIOGRAM  2001   mild LVH, normal LV    Allergies  Allergen Reactions  . Banana Nausea And Vomiting  . Codeine Nausea Only    unless given with Phenergan  . Klonopin [Clonazepam] Other (See Comments)    Causes hallucination   . Meperidine Hcl Nausea Only    unless given with Phenergan  . Norflex [Orphenadrine Citrate] Nausea Only    Unless given with Phenergan  . Oxycodone-Acetaminophen Nausea Only    unless given with phenergan  . Propoxyphene Hcl Nausea Only    unless given with phenergan  . Zoloft [  Sertraline Hcl] Other (See Comments)    Caused lethargy  . Doxycycline Other (See Comments)    Unknown reaction  . Naproxen Other (See Comments)    Unknown reaction  . Penicillins Other (See Comments)    Unknown reaction Has patient had a PCN reaction causing immediate rash, facial/tongue/throat swelling, SOB or lightheadedness with hypotension: Unknown Has patient had a PCN reaction causing severe rash involving mucus membranes or skin necrosis: Unknown Has patient had a PCN reaction that required hospitalization: pt was in the hospital at time of reaction Has patient had a PCN reaction occurring within the last 10 years: Unknown If all of the above answers are "NO", then may proceed with Cephalos  .  Phenothiazines Other (See Comments)    Unknown reaction  . Stelazine Other (See Comments)    Unknown reaction  . Sulfamethoxazole-Trimethoprim Other (See Comments)    Unknown reaction  . Tolectin [Tolmetin Sodium] Other (See Comments)    Unknown reaction  . Tramadol Other (See Comments)    Unknown reaction    Allergies as of 04/28/2020      Reactions   Banana Nausea And Vomiting   Codeine Nausea Only   unless given with Phenergan   Klonopin [clonazepam] Other (See Comments)   Causes hallucination   Meperidine Hcl Nausea Only   unless given with Phenergan   Norflex [orphenadrine Citrate] Nausea Only   Unless given with Phenergan   Oxycodone-acetaminophen Nausea Only   unless given with phenergan   Propoxyphene Hcl Nausea Only   unless given with phenergan   Zoloft [sertraline Hcl] Other (See Comments)   Caused lethargy   Doxycycline Other (See Comments)   Unknown reaction   Naproxen Other (See Comments)   Unknown reaction   Penicillins Other (See Comments)   Unknown reaction Has patient had a PCN reaction causing immediate rash, facial/tongue/throat swelling, SOB or lightheadedness with hypotension: Unknown Has patient had a PCN reaction causing severe rash involving mucus membranes or skin necrosis: Unknown Has patient had a PCN reaction that required hospitalization: pt was in the hospital at time of reaction Has patient had a PCN reaction occurring within the last 10 years: Unknown If all of the above answers are "NO", then may proceed with Cephalos   Phenothiazines Other (See Comments)   Unknown reaction   Stelazine Other (See Comments)   Unknown reaction   Sulfamethoxazole-trimethoprim Other (See Comments)   Unknown reaction   Tolectin [tolmetin Sodium] Other (See Comments)   Unknown reaction   Tramadol Other (See Comments)   Unknown reaction      Medication List       Accurate as of April 28, 2020 10:48 AM. If you have any questions, ask your nurse or  doctor.        acetaminophen 500 MG tablet Commonly known as: TYLENOL Take 1,000 mg by mouth 2 (two) times daily.   antiseptic oral rinse Liqd 15 mLs by Mouth Rinse route 5 (five) times daily as needed for dry mouth.   capsaicin-methyl sal-menthol 0.025-1-12 % Crea Generic drug: Capsaicin-Menthol-Methyl Sal Apply 1 application topically 2 (two) times daily.   carboxymethylcellulose 0.5 % Soln Commonly known as: REFRESH PLUS Place 1 drop into both eyes 5 (five) times daily as needed (dry eyes).   CRANBERRY PO Take 2 tablets by mouth daily. AZO   DULoxetine 60 MG capsule Commonly known as: CYMBALTA Take 1 capsule by mouth once daily   DULoxetine 30 MG capsule Commonly known as: CYMBALTA Take 1 capsule  by mouth daly for back pain and depression in combination with 60 mg tablet   furosemide 40 MG tablet Commonly known as: LASIX Take 1 tablet by mouth once daily   Januvia 100 MG tablet Generic drug: sitaGLIPtin Take 1 tablet by mouth once daily   Lancets Micro Thin 33G Misc Use to test blood sugar daily. Dx: E08.65   lansoprazole 30 MG capsule Commonly known as: PREVACID TAKE 1 CAPSULE BY MOUTH ONCE DAILY AT NOON   Linzess 290 MCG Caps capsule Generic drug: linaclotide TAKE 1 CAPSULE BY MOUTH EVERY DAY AT BEDTIME   loratadine 10 MG tablet Commonly known as: CLARITIN Take 10 mg by mouth daily.   memantine 10 MG tablet Commonly known as: NAMENDA Take 1 tablet by mouth twice daily   Myrbetriq 50 MG Tb24 tablet Generic drug: mirabegron ER Take 1 tablet by mouth once daily   OneTouch Verio test strip Generic drug: glucose blood Use to test blood sugar daily. Dx:E08.65   pilocarpine 5 MG tablet Commonly known as: SALAGEN Take 1 tablet by mouth twice daily   polyethylene glycol 17 g packet Commonly known as: MIRALAX / GLYCOLAX Take 17 g by mouth daily as needed for mild constipation (constipation). Mix with 8 oz. water or juice   potassium chloride SA 20  MEQ tablet Commonly known as: KLOR-CON Take 1 tablet by mouth once daily   pregabalin 100 MG capsule Commonly known as: LYRICA Take 1 capsule by mouth twice daily   senna-docusate 8.6-50 MG tablet Commonly known as: Senna S Take 1 tablet by mouth daily.       Review of Systems  Unable to perform ROS: Dementia    Immunization History  Administered Date(s) Administered  . Fluad Quad(high Dose 65+) 11/08/2018, 11/24/2019  . Influenza Split 12/26/2010, 01/15/2012  . Influenza Whole 12/30/2008, 11/15/2009  . Influenza, High Dose Seasonal PF 12/21/2016, 11/05/2017  . Influenza,inj,Quad PF,6+ Mos 02/04/2013, 11/03/2013, 12/05/2014, 12/13/2015  . Moderna Sars-Covid-2 Vaccination 04/17/2019, 05/15/2019, 12/15/2019  . Pneumococcal Conjugate-13 06/12/2014  . Pneumococcal Polysaccharide-23 02/15/2009, 12/05/2014  . Tdap 12/08/2013  . Zoster 01/29/2012   Pertinent  Health Maintenance Due  Topic Date Due  . OPHTHALMOLOGY EXAM  Never done  . FOOT EXAM  01/28/2020  . HEMOGLOBIN A1C  09/29/2020  . INFLUENZA VACCINE  Completed  . DEXA SCAN  Completed  . PNA vac Low Risk Adult  Completed  . URINE MICROALBUMIN  Discontinued   Fall Risk  04/01/2020 03/16/2020 12/29/2019 12/09/2019 12/09/2019  Falls in the past year? 1 1 1  - 1  Number falls in past yr: 1 1 1  - 0  Injury with Fall? 0 0 1 1 1   Comment - - rib fracture sprined hand, shoulder hurt sprained ankle  Risk Factor Category  - - - - -  Risk for fall due to : - - History of fall(s) - -  Follow up - - - - -   Functional Status Survey:    Vitals:   04/28/20 1035  BP: (!) 121/50  Pulse: 91  Resp: 18  Temp: 97.6 F (36.4 C)  SpO2: 94%  Weight: 149 lb 14.6 oz (68 kg)  Height: 5\' 3"  (1.6 m)   Body mass index is 26.56 kg/m. Physical Exam Vitals reviewed.  HENT:     Head: Atraumatic.      Nose: Nose normal.     Mouth/Throat:     Comments: Missing teeth Eyes:     General:  Right eye: Discharge present.         Left eye: No discharge.  Cardiovascular:     Rate and Rhythm: Normal rate. Rhythm irregular.     Pulses: Normal pulses.     Heart sounds: Normal heart sounds. No murmur heard.   Pulmonary:     Effort: Pulmonary effort is normal. No respiratory distress.     Breath sounds: Normal breath sounds. No wheezing.  Abdominal:     General: Abdomen is flat. Bowel sounds are normal. There is no distension.     Palpations: Abdomen is soft.     Tenderness: There is no abdominal tenderness.  Musculoskeletal:     Cervical back: Normal range of motion.     Right lower leg: No edema.     Left lower leg: Edema present.     Comments: Non-pitting. Cam walker boot in place.   Lymphadenopathy:     Cervical: No cervical adenopathy.  Skin:    General: Skin is warm and dry.     Capillary Refill: Capillary refill takes less than 2 seconds.     Findings: Bruising present.     Comments: Dry skin on all extremities  Neurological:     General: No focal deficit present.     Mental Status: She is alert. Mental status is at baseline.     Cranial Nerves: No cranial nerve deficit.     Sensory: No sensory deficit.     Motor: Weakness present.     Gait: Gait abnormal.  Psychiatric:        Mood and Affect: Mood normal.        Behavior: Behavior normal.        Cognition and Memory: Memory is impaired.     Labs reviewed: Recent Labs    04/23/20 1051 04/24/20 0547 04/25/20 0628  NA 139 134* 136  K 4.0 4.0 3.8  CL 101 98 102  CO2 27 25 25   GLUCOSE 137* 147* 161*  BUN 18 21 12   CREATININE 0.56 0.85 0.63  CALCIUM 9.8 9.3 9.6   Recent Labs    05/20/19 1646  AST 21  ALT 26  ALKPHOS 90  BILITOT 0.4  PROT 7.4  ALBUMIN 4.1   Recent Labs    05/20/19 1646 04/01/20 1614 04/23/20 1051 04/24/20 0547 04/25/20 0628  WBC 6.1 8.8 6.5 7.5 7.0  NEUTROABS 3.3 5,878  --   --   --   HGB 13.2 13.3 12.2 13.1 13.5  HCT 40.9 39.9 37.4 40.0 41.0  MCV 96.7 93.7 96.4 94.8 94.0  PLT 199 242 218 241 246   Lab  Results  Component Value Date   TSH 1.35 09/04/2017   Lab Results  Component Value Date   HGBA1C 6.8 (H) 04/01/2020   Lab Results  Component Value Date   CHOL 183 09/06/2017   HDL 63 09/06/2017   LDLCALC 99 09/06/2017   LDLDIRECT 65.7 07/08/2010   TRIG 113 09/06/2017   CHOLHDL 2.9 09/06/2017    Significant Diagnostic Results in last 30 days:  DG Ankle Complete Left  Result Date: 04/23/2020 CLINICAL DATA:  Left ankle pain after fall. EXAM: LEFT ANKLE COMPLETE - 3+ VIEW COMPARISON:  Left ankle x-rays dated April 16, 2020. FINDINGS: Acute nondisplaced fracture of the distal fibula again noted. No new fracture. No dislocation. The ankle mortise is symmetric. The talar dome is intact. Joint spaces are preserved. Osteopenia. Decreasing soft tissue swelling over the lateral malleolus. IMPRESSION: 1. Unchanged acute nondisplaced distal fibular  fracture. No new fracture. Electronically Signed   By: Titus Dubin M.D.   On: 04/23/2020 11:56   CT Head Wo Contrast  Result Date: 04/23/2020 CLINICAL DATA:  Un witnessed fall. EXAM: CT HEAD WITHOUT CONTRAST CT MAXILLOFACIAL WITHOUT CONTRAST CT CERVICAL SPINE WITHOUT CONTRAST TECHNIQUE: Multidetector CT imaging of the head, cervical spine, and maxillofacial structures were performed using the standard protocol without intravenous contrast. Multiplanar CT image reconstructions of the cervical spine and maxillofacial structures were also generated. COMPARISON:  CT cervical spine from 12/03/2014. CT head December 13, 2018. FINDINGS: CT HEAD FINDINGS Brain: Small volume of acute subarachnoid hemorrhage along the right frontal convexity (for example see series 4, image 22). Some hemorrhage layering within the anterior aspect of the right sylvian fissure and overlying the anterior right temporal convexity (series 6, image 12; series 4, image 14). No definite acute intraparenchymal hemorrhage. Similar prior right subinsular and right parietal infarcts with  encephalomalacia. Similar additional patchy white matter hypodensities, most likely related to chronic microvascular ischemic disease. No evidence of acute large vascular territory infarct. No hydrocephalus. No evidence of intraventricular hemorrhage. No mass lesion or abnormal mass effect. Vascular: Calcific atherosclerosis. No hyperdense vessel identified. Skull: No acute fracture. Other: No mastoid effusions. CT MAXILLOFACIAL FINDINGS Osseous: No fracture or mandibular dislocation. No destructive process. Chronic right posterior zygomatic cerclage wire. Orbits: No retro bulbar hematoma. Globes are symmetric and within normal limits. No proptosis. Sinuses: Chronic hypoplastic right maxillary sinus. The sinuses are largely clear. No air-fluid levels. Soft tissues: Right periorbital/malar soft tissue contusion. CT CERVICAL SPINE FINDINGS Alignment: Similar alignment. Mild levocurvature. No substantial sagittal subluxation. Skull base and vertebrae: Vertebral body heights are maintained. No evidence of acute fracture. Similar degenerative endplate Schmorl's node involving the superior C4 endplate. Soft tissues and spinal canal: No prevertebral fluid or swelling. No visible canal hematoma. Disc levels: Mild to moderate multilevel degenerative disc disease with disc height loss and endplate spurring. No evidence of significant bony canal stenosis. Upper chest: Biapical pleuroparenchymal scarring. Otherwise, visualized lung apices are clear. IMPRESSION: CT head: 1. Small volume of acute right frontotemporal subarachnoid hemorrhage, as detailed above. No mass effect. 2. Remote right subinsular and right frontal infarcts and chronic microvascular ischemic disease. CT maxillofacial 1. No acute fracture. 2. Right periorbital/malar contusion. CT cervical spine: 1. No evidence of acute fracture or traumatic malalignment. 2. Similar large left thyroid lesion, which was biopsied on Jun 18, 2013. Findings discussed with Dr. Tomi Bamberger  via telephone at 12:40 PM. Electronically Signed   By: Margaretha Sheffield MD   On: 04/23/2020 12:43   CT Cervical Spine Wo Contrast  Result Date: 04/23/2020 CLINICAL DATA:  Un witnessed fall. EXAM: CT HEAD WITHOUT CONTRAST CT MAXILLOFACIAL WITHOUT CONTRAST CT CERVICAL SPINE WITHOUT CONTRAST TECHNIQUE: Multidetector CT imaging of the head, cervical spine, and maxillofacial structures were performed using the standard protocol without intravenous contrast. Multiplanar CT image reconstructions of the cervical spine and maxillofacial structures were also generated. COMPARISON:  CT cervical spine from 12/03/2014. CT head December 13, 2018. FINDINGS: CT HEAD FINDINGS Brain: Small volume of acute subarachnoid hemorrhage along the right frontal convexity (for example see series 4, image 22). Some hemorrhage layering within the anterior aspect of the right sylvian fissure and overlying the anterior right temporal convexity (series 6, image 12; series 4, image 14). No definite acute intraparenchymal hemorrhage. 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 Similar additional patchy white matter hypodensities, most likely related to chronic microvascular ischemic disease. No evidence  of acute large vascular territory infarct. No hydrocephalus. No evidence of intraventricular hemorrhage. No mass lesion or abnormal mass effect. Vascular: Calcific atherosclerosis. No hyperdense vessel identified. Skull: No acute fracture. Other: No mastoid effusions. CT MAXILLOFACIAL FINDINGS Osseous: No fracture or mandibular dislocation. No destructive process. Chronic right posterior zygomatic cerclage wire. Orbits: No retro bulbar hematoma. Globes are symmetric and within normal limits. No proptosis. Sinuses: Chronic hypoplastic right maxillary sinus. The sinuses are largely clear. No air-fluid levels. Soft tissues: Right periorbital/malar soft tissue contusion. CT CERVICAL SPINE FINDINGS Alignment:  Similar alignment. Mild levocurvature. No substantial sagittal subluxation. Skull base and vertebrae: Vertebral body heights are maintained. No evidence of acute fracture. Similar degenerative endplate Schmorl's node involving the superior C4 endplate. Soft tissues and spinal canal: No prevertebral fluid or swelling. No visible canal hematoma. Disc levels: Mild to moderate multilevel degenerative disc disease with disc height loss and endplate spurring. No evidence of significant bony canal stenosis. Upper chest: Biapical pleuroparenchymal scarring. Otherwise, visualized lung apices are clear. IMPRESSION: CT head: 1. Small volume of acute right frontotemporal subarachnoid hemorrhage, as detailed above. No mass effect. 2. Remote right subinsular and right frontal infarcts and chronic microvascular ischemic disease. CT maxillofacial 1. No acute fracture. 2. Right periorbital/malar contusion. CT cervical spine: 1. No evidence of acute fracture or traumatic malalignment. 2. Similar large left thyroid lesion, which was biopsied on Jun 18, 2013. Findings discussed with Dr. Tomi Bamberger via telephone at 12:40 PM. Electronically Signed   By: Margaretha Sheffield MD   On: 04/23/2020 12:43   DG Chest Portable 1 View  Result Date: 04/23/2020 CLINICAL DATA:  Fall. EXAM: PORTABLE CHEST 1 VIEW COMPARISON:  Chest x-ray dated December 16, 2019. FINDINGS: The heart is at the upper limits of normal in size. Normal pulmonary vascularity. No focal consolidation, pleural effusion, or pneumothorax. No acute osseous abnormality. IMPRESSION: No active disease. Electronically Signed   By: Titus Dubin M.D.   On: 04/23/2020 12:00   DG Shoulder Left  Result Date: 04/23/2020 CLINICAL DATA:  Left shoulder pain after fall. EXAM: LEFT SHOULDER - 2+ VIEW COMPARISON:  Left shoulder x-rays dated Jul 11, 2019. FINDINGS: No acute fracture or dislocation. Unchanged mild acromioclavicular osteoarthritis. Soft tissues are unremarkable. IMPRESSION: 1. No  acute osseous abnormality. Electronically Signed   By: Titus Dubin M.D.   On: 04/23/2020 11:57   DG Hip Unilat W or Wo Pelvis 2-3 Views Left  Result Date: 04/23/2020 CLINICAL DATA:  Left hip pain after fall. EXAM: DG HIP (WITH OR WITHOUT PELVIS) 2-3V LEFT COMPARISON:  Left hip x-rays dated April 16, 2020. FINDINGS: No acute fracture or dislocation. Unchanged mild bilateral hip osteoarthritis. Osteopenia. Soft tissues are unremarkable. IMPRESSION: 1. No acute osseous abnormality. Electronically Signed   By: Titus Dubin M.D.   On: 04/23/2020 11:58   VAS [place] LOWER EXTREMITY VENOUS (DVT)  Result Date: 04/24/2020  Lower Venous DVT Study Indications: Swelling.  Limitations: Body habitus and poor ultrasound/tissue interface. Comparison Study: No prior studies. Performing Technologist: Oliver Hum RVT  Examination Guidelines: A complete evaluation includes B-mode imaging, spectral Doppler, color Doppler, and power Doppler as needed of all accessible portions of each vessel. Bilateral testing is considered an integral part of a complete examination. Limited examinations for reoccurring indications may be performed as noted. The reflux portion of the exam is performed with the patient in reverse Trendelenburg.  +-----+---------------+---------+-----------+----------+--------------+ RIGHTCompressibilityPhasicitySpontaneityPropertiesThrombus Aging +-----+---------------+---------+-----------+----------+--------------+ CFV  Full           Yes  Yes                                 +-----+---------------+---------+-----------+----------+--------------+   +---------+---------------+---------+-----------+----------+--------------+ LEFT     CompressibilityPhasicitySpontaneityPropertiesThrombus Aging +---------+---------------+---------+-----------+----------+--------------+ CFV      Full           Yes      Yes                                  +---------+---------------+---------+-----------+----------+--------------+ SFJ      Full                                                        +---------+---------------+---------+-----------+----------+--------------+ FV Prox  Full                                                        +---------+---------------+---------+-----------+----------+--------------+ FV Mid   Full                                                        +---------+---------------+---------+-----------+----------+--------------+ FV DistalFull                                                        +---------+---------------+---------+-----------+----------+--------------+ PFV      Full                                                        +---------+---------------+---------+-----------+----------+--------------+ POP      Full           Yes      Yes                                 +---------+---------------+---------+-----------+----------+--------------+ PTV      Full                                                        +---------+---------------+---------+-----------+----------+--------------+ PERO     Full                                                        +---------+---------------+---------+-----------+----------+--------------+     Summary: RIGHT: - No evidence of  common femoral vein obstruction.  LEFT: - There is no evidence of deep vein thrombosis in the lower extremity. However, portions of this examination were limited- see technologist comments above.  - No cystic structure found in the popliteal fossa.  *See table(s) above for measurements and observations. Electronically signed by Harold Barban MD on 04/24/2020 at 4:18:57 PM.    Final    CT Maxillofacial Wo Contrast  Result Date: 04/23/2020 CLINICAL DATA:  Un witnessed fall. EXAM: CT HEAD WITHOUT CONTRAST CT MAXILLOFACIAL WITHOUT CONTRAST CT CERVICAL SPINE WITHOUT CONTRAST TECHNIQUE: Multidetector CT imaging of  the head, cervical spine, and maxillofacial structures were performed using the standard protocol without intravenous contrast. Multiplanar CT image reconstructions of the cervical spine and maxillofacial structures were also generated. COMPARISON:  CT cervical spine from 12/03/2014. CT head December 13, 2018. FINDINGS: CT HEAD FINDINGS Brain: Small volume of acute subarachnoid hemorrhage along the right frontal convexity (for example see series 4, image 22). Some hemorrhage layering within the anterior aspect of the right sylvian fissure and overlying the anterior right temporal convexity (series 6, image 12; series 4, image 14). No definite acute intraparenchymal hemorrhage. 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 Similar additional patchy white matter hypodensities, most likely related to chronic microvascular ischemic disease. No evidence of acute large vascular territory infarct. No hydrocephalus. No evidence of intraventricular hemorrhage. No mass lesion or abnormal mass effect. Vascular: Calcific atherosclerosis. No hyperdense vessel identified. Skull: No acute fracture. Other: No mastoid effusions. CT MAXILLOFACIAL FINDINGS Osseous: No fracture or mandibular dislocation. No destructive process. Chronic right posterior zygomatic cerclage wire. Orbits: No retro bulbar hematoma. Globes are symmetric and within normal limits. No proptosis. Sinuses: Chronic hypoplastic right maxillary sinus. The sinuses are largely clear. No air-fluid levels. Soft tissues: Right periorbital/malar soft tissue contusion. CT CERVICAL SPINE FINDINGS Alignment: Similar alignment. Mild levocurvature. No substantial sagittal subluxation. Skull base and vertebrae: Vertebral body heights are maintained. No evidence of acute fracture. Similar degenerative endplate Schmorl's node involving the superior C4 endplate. Soft tissues and spinal canal: No prevertebral fluid or swelling. No visible canal  hematoma. Disc levels: Mild to moderate multilevel degenerative disc disease with disc height loss and endplate spurring. No evidence of significant bony canal stenosis. Upper chest: Biapical pleuroparenchymal scarring. Otherwise, visualized lung apices are clear. IMPRESSION: CT head: 1. Small volume of acute right frontotemporal subarachnoid hemorrhage, as detailed above. No mass effect. 2. Remote right subinsular and right frontal infarcts and chronic microvascular ischemic disease. CT maxillofacial 1. No acute fracture. 2. Right periorbital/malar contusion. CT cervical spine: 1. No evidence of acute fracture or traumatic malalignment. 2. Similar large left thyroid lesion, which was biopsied on Jun 18, 2013. Findings discussed with Dr. Tomi Bamberger via telephone at 12:40 PM. Electronically Signed   By: Margaretha Sheffield MD   On: 04/23/2020 12:43    Assessment/Plan 1. Other closed fracture of distal end of left fibula with routine healing, subsequent encounter - followed by orthopedics - suspected cause polypharmacy, comorbidities and advanced age - refrain from narcotic and psychotropic medications - cont cam walker boot - cont NWBAT - cont tylenol, lyrica and cymbalta prn for pain  2. Subarachnoid hemorrhage (HCC) - secondary to fall - aspirin discontinued  3. Recurrent falls - she remains high risk for fall with fracture - cont PT/OT  4. Vascular dementia without behavioral disturbance (HCC) - no behavioral outbursts - cont namenda  5. Sjogren's syndrome with keratoconjunctivitis sicca ([place]) - she complains of dry eyes and  mouth today - will change refresh eye drops and biotene to QID  6. Controlled type 2 diabetes mellitus with diabetic neuropathy, without long-term current use of insulin (HCC) - sugars controlled with januvia   7. Irritable bowel syndrome with both constipation and diarrhea - controlled with combination of linzess, senna and miralax  8. HYPERTENSION, BENIGN - bp at  goal, cont lasix - cbc/diff in 1 week - bmp in 1 week  9. Gastroesophageal reflux disease without esophagitis - stable with prevacid  10. Dry eyes - see above  11. Dry mouth - see above    Family/ staff Communication: Plan discussed with patient and facility nurse  Labs/tests ordered:  Cbc/diff, bmp in 1 week

## 2020-04-29 ENCOUNTER — Ambulatory Visit: Payer: Medicare Other | Admitting: Internal Medicine

## 2020-04-29 LAB — URINE CULTURE: Culture: >=100000 — AB

## 2020-05-04 ENCOUNTER — Non-Acute Institutional Stay (SKILLED_NURSING_FACILITY): Payer: Medicare Other | Admitting: Internal Medicine

## 2020-05-04 ENCOUNTER — Ambulatory Visit: Payer: Self-pay

## 2020-05-04 ENCOUNTER — Encounter: Payer: Self-pay | Admitting: Internal Medicine

## 2020-05-04 ENCOUNTER — Ambulatory Visit (INDEPENDENT_AMBULATORY_CARE_PROVIDER_SITE_OTHER): Payer: Medicare Other | Admitting: Physician Assistant

## 2020-05-04 ENCOUNTER — Encounter: Payer: Self-pay | Admitting: Orthopedic Surgery

## 2020-05-04 DIAGNOSIS — M545 Low back pain, unspecified: Secondary | ICD-10-CM

## 2020-05-04 DIAGNOSIS — S82832S Other fracture of upper and lower end of left fibula, sequela: Secondary | ICD-10-CM

## 2020-05-04 DIAGNOSIS — M25572 Pain in left ankle and joints of left foot: Secondary | ICD-10-CM

## 2020-05-04 DIAGNOSIS — F0150 Vascular dementia without behavioral disturbance: Secondary | ICD-10-CM

## 2020-05-04 DIAGNOSIS — R296 Repeated falls: Secondary | ICD-10-CM | POA: Diagnosis not present

## 2020-05-04 DIAGNOSIS — I609 Nontraumatic subarachnoid hemorrhage, unspecified: Secondary | ICD-10-CM

## 2020-05-04 DIAGNOSIS — E1140 Type 2 diabetes mellitus with diabetic neuropathy, unspecified: Secondary | ICD-10-CM

## 2020-05-04 DIAGNOSIS — F3175 Bipolar disorder, in partial remission, most recent episode depressed: Secondary | ICD-10-CM

## 2020-05-04 DIAGNOSIS — M3501 Sicca syndrome with keratoconjunctivitis: Secondary | ICD-10-CM

## 2020-05-04 DIAGNOSIS — I480 Paroxysmal atrial fibrillation: Secondary | ICD-10-CM

## 2020-05-04 DIAGNOSIS — Z8679 Personal history of other diseases of the circulatory system: Secondary | ICD-10-CM

## 2020-05-04 DIAGNOSIS — G8929 Other chronic pain: Secondary | ICD-10-CM

## 2020-05-04 DIAGNOSIS — N3281 Overactive bladder: Secondary | ICD-10-CM

## 2020-05-04 DIAGNOSIS — K582 Mixed irritable bowel syndrome: Secondary | ICD-10-CM

## 2020-05-04 NOTE — Progress Notes (Signed)
Office Visit Note   Patient: Ariana [name]           Date of Birth: 85-Nov-1931           MRN: [id number] Visit Date: 05/04/2020              Requested by: Gayland Curry, DO [place],  [place] [address] PCP: Gayland Curry, DO  Chief Complaint  Patient presents with  . Left Ankle - Follow-up    S/p fall 04/16/20       HPI: Patient is 3 weeks status post left distal fibula fracture minimally displaced.  She has been at a rehab facility.  She has no complaints and is accompanied by her caregiver that normally cares for her at home  Assessment & Plan: Visit Diagnoses:  1. Pain in left ankle and joints of left foot     Plan: May begin weightbearing as tolerated and can transition him out of the boot in about a week.  We will work with therapy on strengthening gait training and fall prevention may needed ankle brace as an intermediary after coming out of the boot but certainly could transition to a well supported sneaker  Follow-Up Instructions: No follow-ups on file.   Ortho Exam  Patient is alert, oriented, no adenopathy, well-dressed, normal affect, normal respiratory effort. Left ankle no swelling no erythema palpable pulses.  She is nontender to palpation over the fracture site ankle range of motion is past neutral by 10 degrees and painless.  Compartments are soft and compressible  Imaging: No results found. No images are attached to the encounter.  Labs: Lab Results  Component Value Date   HGBA1C 6.8 (H) 04/01/2020   HGBA1C 6.0 (H) 03/07/2018   HGBA1C 6.4 (H) 09/04/2017   ESRSEDRATE 5 08/23/2012   ESRSEDRATE 16 07/18/2011   ESRSEDRATE 11 07/08/2010   REPTSTATUS 04/29/2020 FINAL 04/26/2020   CULT >=100,000 COLONIES/mL ESCHERICHIA COLI (A) 04/26/2020   LABORGA ESCHERICHIA COLI (A) 04/26/2020     Lab Results  Component Value Date   ALBUMIN 4.1 05/20/2019   ALBUMIN 4.5 03/04/2019   ALBUMIN 4.4 12/04/2018    Lab Results  Component Value Date    MG 2.0 04/24/2017   MG 2.0 04/23/2017   MG 2.0 06/29/2015   Lab Results  Component Value Date   VD25OH 52 05/16/2012    No results found for: PREALBUMIN CBC EXTENDED Latest Ref Rng & Units 04/25/2020 04/24/2020 04/23/2020  WBC 4.0 - 10.5 K/uL 7.0 7.5 6.5  RBC 3.87 - 5.11 MIL/uL 4.36 4.22 3.88  HGB 12.0 - 15.0 g/dL 13.5 13.1 12.2  HCT 36.0 - 46.0 % 41.0 40.0 37.4  PLT 150 - 400 K/uL 246 241 218  NEUTROABS 1,500 - 7,800 cells/uL - - -  LYMPHSABS 850 - 3,900 cells/uL - - -     There is no height or weight on file to calculate BMI.  Orders:  Orders Placed This Encounter  Procedures  . XR Ankle Complete Left   No orders of the defined types were placed in this encounter.    Procedures: No procedures performed  Clinical Data: No additional findings.  ROS:  All other systems negative, except as noted in the HPI. Review of Systems  Objective: Vital Signs: There were no vitals taken for this visit.  Specialty Comments:  No specialty comments available.  PMFS History: Patient Active Problem List   Diagnosis Date Noted  . Subarachnoid hemorrhage ([place]) 04/23/2020  .  Diabetic neuropathy ([place]) 01/28/2019  . Diabetes mellitus due to underlying condition, uncontrolled ([place]) 09/04/2017  . History of rectal bleeding 09/04/2017  . A-fib ([place]) 04/23/2017  . Diabetes mellitus type 2 in nonobese ([place]) 04/23/2017  . Sjogren's disease ([place]) 04/03/2016  . Primary osteoarthritis of both hands 04/03/2016  . High risk medication use 04/03/2016  . Senile dementia, with behavioral disturbance ([place]) 08/16/2015  . Swelling 08/16/2015  . Diastolic dysfunction 44/96/7591  . Hypertonicity, bladder 07/08/2015  . Arterial hypotension   . Bradycardia 06/29/2015  . Chronic lower back pain 06/29/2015  . PAF (paroxysmal atrial fibrillation) ([place]) 04/18/2015  . Dehydration 04/18/2015  . Dementia without behavioral disturbance ([place]) 04/18/2015  . TIA (transient ischemic attack) 12/17/2014  .  Chest pain 12/16/2014  . Acute encephalopathy 12/04/2014  . Fall   . AKI (acute kidney injury) ([place]) 12/03/2014  . History of cerebrovascular disease 09/24/2014  . Falls frequently 07/28/2014  . Infarction of parietal lobe ([place])   . Mild dementia ([place])   . CVA (cerebral infarction) 07/26/2014  . Atrial fibrillation with RVR ([place]) 07/03/2014  . Constipation 04/15/2014  . Mixed stress and urge urinary incontinence 11/03/2013  . Therapeutic opioid induced constipation 11/03/2013  . Hemorrhoid 11/03/2013  . Low back pain associated with a spinal disorder other than radiculopathy or spinal stenosis 11/03/2013  . Protein-calorie malnutrition, severe ([place]) 07/22/2013  . UTI (lower urinary tract infection) 07/20/2013  . Prolonged QT interval 07/20/2013  . Osteopenia 07/17/2013  . Palpitations 06/30/2013  . Hereditary and idiopathic peripheral neuropathy 05/22/2013  . Abnormality of gait 12/05/2012  . Headache(784.0) 09/05/2012  . Multinodular thyroid 01/15/2012  . Neck pain 01/15/2012  . Hypercholesterolemia 07/08/2010  . Tear film insufficiency 07/06/2009  . [place] SYNDROME 07/06/2009  . Urge urinary incontinence 01/06/2009  . COLONIC POLYPS, ADENOMATOUS, HX OF 04/15/2008  . MITRAL VALVE PROLAPSE 11/05/2007  . ANXIETY DEPRESSION 07/02/2007  . Mononeuritis 07/02/2007  . HYPERTENSION, BENIGN 07/02/2007  . GERD 07/02/2007  . Irritable bowel syndrome 07/02/2007  . Fibromyalgia 07/02/2007   Past Medical History:  Diagnosis Date  . Abnormality of gait   . Adenomatous polyp of colon 2002   31mm  . Allergic rhinitis   . Anxiety   . Anxiety and depression   . Chronic back pain   . Dementia without behavioral disturbance ([place])   . Depression   . Diabetes ([place])   . Diverticulosis of colon   . Dry eye syndrome   . Dysphagia   . Dysthymic disorder   . Fall   . Fibromyalgia   . GERD (gastroesophageal reflux disease)   . H/O hiatal hernia   . History of adverse drug reaction    . History of cerebrovascular disease 09/24/2014  . History of recurrent UTIs   . Hypertension, benign   . Irritable bowel syndrome   . Low back pain syndrome   . Memory loss   . Mitral valve prolapse   . Paroxysmal A-fib ([place])   . Peripheral neuropathy    "both feet and legs"  . Physical deconditioning   . Sjogren's syndrome ([place])   . Therapeutic opioid-induced constipation ([facility])   . Thyroid nodule   . Urinary incontinence     Family History  Problem Relation Age of Onset  . Heart disease Father        heart attack  . Pneumonia Father   . Heart attack Mother   . Hypertension Mother   . Colon cancer Sister   . Kidney disease  Daughter   . Asthma Daughter   . Arthritis Daughter 11       osteo,  . Heart disease Son 78       stage 3 CHF(Diastolic /Systolic)  . Throat cancer Brother   . Hypertension Maternal Grandmother     Past Surgical History:  Procedure Laterality Date  . ABDOMINAL HYSTERECTOMY  1967  . APPENDECTOMY    . CARDIAC CATHETERIZATION  02/17/2003   normal L main, LAD free of disease, Cfx free of disease, RCA free of disease (Dr. Loni Muse. [place])  . CATARACT EXTRACTION, BILATERAL    . CHOLECYSTECTOMY  2000  . COLONOSCOPY W/ BIOPSIES     multiple  . DENTAL SURGERY     multiple tooth extractions  . ESOPHAGOGASTRODUODENOSCOPY (EGD) WITH ESOPHAGEAL DILATION N/A 08/23/2012   Procedure: ESOPHAGOGASTRODUODENOSCOPY (EGD) WITH ESOPHAGEAL DILATION;  Surgeon: Milus Banister, MD;  Location: [facility];  Service: Endoscopy;  Laterality: N/A;  . NASAL SEPTUM SURGERY  1980  . NM MYOCAR PERF WALL MOTION  2003   persantine - normal static and dynamic study w/apical thinning and presvered LV function, no ischemia  . SINUS EXPLORATION     ossifiying fibroma  . TEMPOROMANDIBULAR JOINT SURGERY  1986   Dr. Terence Lux  . TRANSTHORACIC ECHOCARDIOGRAM  2001   mild LVH, normal LV   Social History   Occupational History  . Occupation: Retired  Tobacco Use  . Smoking status: Former  Research scientist (life sciences)  . Smokeless tobacco: Never Used  . Tobacco comment: Quit at age 50   Vaping Use  . Vaping Use: Never used  Substance and Sexual Activity  . Alcohol use: No  . Drug use: Never  . Sexual activity: Never

## 2020-05-04 NOTE — Progress Notes (Signed)
Provider:  Rexene Edison. Mariea Clonts, D.O., C.M.D. Location:  [facility] Room Number: 109/P Place of Service:  SNF (31)  PCP: Gayland Curry, DO Patient Care Team: Gayland Curry, DO as PCP - General (Geriatric Medicine) Noralee Space, MD as Consulting Physician (Pulmonary Disease) Maisie Fus, MD as Consulting Physician (Obstetrics and Gynecology) Debara Pickett Nadean Corwin, MD as Consulting Physician (Cardiology) Monna Fam, MD as Consulting Physician (Ophthalmology) Kathrynn Ducking, MD as Consulting Physician (Neurology) Franchot Gallo, MD as Consulting Physician (Urology) Lavonna Monarch, MD as Consulting Physician (Dermatology) Gatha Mayer, MD as Consulting Physician (Gastroenterology) Standley Brooking, LCSW as Social Worker  Extended Emergency Contact Information Primary Emergency Contact: Era Skeen Address: [address]          [place], [place] [address] Johnnette Litter of [place] Phone: [phone] Relation: Daughter Secondary Emergency Contact: Arvin Collard [place] Mobile Phone: [phone] Relation: Other  Code Status: FULL Goals of Care: Advanced Directive information Advanced Directives 05/04/2020  Does Patient Have a Medical Advance Directive? No  Type of Advance Directive -  Does patient want to make changes to medical advance directive? No - Patient declined  Copy of [place] in Chart? -  Would patient like information on creating a medical advance directive? -  Pre-existing out of facility DNR order (yellow form or pink MOST form) -   Chief Complaint  Patient presents with  . New Admit To SNF    New Admission to [facility]     HPI: Patient is a 87 y.o. female with vascular dementia, afib, bipolar depression, sjogren's, IBS with diarrhea and constipation, some chronic low back pain and anxiety seen today for admission to [facility].  She sees me long-term as an outpatient and has long required facility placement that her daughter has never arranged despite extensive involvement by hospital and [facility] social work, an APS call by a caregiver and numerous urgings by myself and hospital physicians.  She continues to have caregivers only during the week and be left alone on weekends.    She is status post an urgent care visit on March 4 with a fall and ankle injury resulting in a closed nondisplaced fracture of the lateral malleolus of her left fibula for which she was placed in a posterior splint with orthopedic surgery follow-up and was nonweightbearing but advised to use a walker or cane.  Patient has dementia.  She then had orthopedics follow-up on March 8.  Of course she was putting full weight on her leg.  Continuing to have pain and was wanting something stronger than Tylenol.  She was put into a compression sock in a cam walker boot to be worn during the day and was told to elevate the leg to decrease swelling.  She was given hydrocodone 1 tablet every 8 hours as needed for pain and was to follow-up in 2 weeks for repeat x-rays.  She then presented to the ED on 3/11 after sustaining had another fall.  She had tried to get up to go to the bathroom and fell and hit her face.  She was having pain in her shoulder, left hip and left ankle.  She reportedly did not lose consciousness and her fall was unwitnessed.    In the emergency department notable labs included a glucose of 137 CT scan revealed small volume acute right frontotemporal subarachnoid hemorrhage and remote right  insular and right frontal infarcts with chronic microvascular ischemic disease.  Her x-ray of her left ankle shoulder, hip and chest were unremarkable.  Neurosurgery did not recommend any further evaluation.  She was admitted with PT consultatian and for pain control.  Skilled nursing facility was recommended.  She was weaned off the opioids.  She had some pyuria  on her urine culture but no signs and symptoms of urinary tract infection.  DVT prophylaxis with aspirin, Lovenox or heparin was avoided due to subarachnoid hemorrhage.  She was not in atrial fibrillation at this time so her aspirin was held due to the bleeding.  Cardizem was also held.  Her Xanax was also discontinued.  We will see how this goes with her bipolar disorder.  I have not been able to wean this for years while she has been my patient.  Diabetes was under good control with Januvia and Tradjenta on CBGs running 1 40-1 80 at the hospital.  She is for follow-up CBC and BMP in a week.  When seen, she was doing quite well.  She admits to pain in her distal thigh/knee when up in her boot and c/o the heaviness it causes.  Bowels are moving.  She is quite alert and her caregiver from home is with her.  Her biggest complaint was mouth pain and she is wanting the magic mouthwash I'd given her in the past for this as the biotene alone is not helping her.    Past Medical History:  Diagnosis Date  . Abnormality of gait   . Adenomatous polyp of colon 2002   10mm  . Allergic rhinitis   . Anxiety   . Anxiety and depression   . Chronic back pain   . Dementia without behavioral disturbance ([place])   . Depression   . Diabetes ([place])   . Diverticulosis of colon   . Dry eye syndrome   . Dysphagia   . Dysthymic disorder   . Fall   . Fibromyalgia   . GERD (gastroesophageal reflux disease)   . H/O hiatal hernia   . History of adverse drug reaction   . History of cerebrovascular disease 09/24/2014  . History of recurrent UTIs   . Hypertension, benign   . Irritable bowel syndrome   . Low back pain syndrome   . Memory loss   . Mitral valve prolapse   . Paroxysmal A-fib ([place])   . Peripheral neuropathy    "both feet and legs"  . Physical deconditioning   . Sjogren's syndrome ([place])   . Therapeutic opioid-induced constipation (OIC)   . Thyroid nodule   . Urinary incontinence    Past Surgical  History:  Procedure Laterality Date  . ABDOMINAL HYSTERECTOMY  1967  . APPENDECTOMY    . CARDIAC CATHETERIZATION  02/17/2003   normal L main, LAD free of disease, Cfx free of disease, RCA free of disease (Dr. Loni Muse. [place])  . CATARACT EXTRACTION, BILATERAL    . CHOLECYSTECTOMY  2000  . COLONOSCOPY W/ BIOPSIES     multiple  . DENTAL SURGERY     multiple tooth extractions  . ESOPHAGOGASTRODUODENOSCOPY (EGD) WITH ESOPHAGEAL DILATION N/A 08/23/2012   Procedure: ESOPHAGOGASTRODUODENOSCOPY (EGD) WITH ESOPHAGEAL DILATION;  Surgeon: Milus Banister, MD;  Location: [facility] ENDOSCOPY;  Service: Endoscopy;  Laterality: N/A;  . NASAL SEPTUM SURGERY  1980  . NM MYOCAR PERF WALL MOTION  2003   persantine - normal static and dynamic study w/apical thinning and presvered LV function, no ischemia  .  SINUS EXPLORATION     ossifiying fibroma  . TEMPOROMANDIBULAR JOINT SURGERY  1986   Dr. Terence Lux  . TRANSTHORACIC ECHOCARDIOGRAM  2001   mild LVH, normal LV    Social History   Socioeconomic History  . Marital status: Widowed    Spouse name: Not on file  . Number of children: 2  . Years of education: 40  . Highest education level: Not on file  Occupational History  . Occupation: Retired  Tobacco Use  . Smoking status: Former Research scientist (life sciences)  . Smokeless tobacco: Never Used  . Tobacco comment: Quit at age 7   Vaping Use  . Vaping Use: Never used  Substance and Sexual Activity  . Alcohol use: No  . Drug use: Never  . Sexual activity: Never  Other Topics Concern  . 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    Patient is Widowed.    Patient has 2 children.    Patient is retired.    Former smoker   Alcohol none   Exercise Walk, exercise chair 4 days a week   POA    Walks with cane      Patient drinks about 1-2 cups of hot tea daily.   Patient is right handed.               Social Determinants of Health   Financial Resource Strain: Not on file   Food Insecurity: Not on file  Transportation Needs: Not on file  Physical Activity: Not on file  Stress: Not on file  Social Connections: Not on file    reports that she has quit smoking. She has never used smokeless tobacco. She reports that she does not drink alcohol and does not use drugs.  Functional Status Survey:    Family History  Problem Relation Age of Onset  . Heart disease Father        heart attack  . Pneumonia Father   . Heart attack Mother   . Hypertension Mother   . Colon cancer Sister   . Kidney disease Daughter   . Asthma Daughter   . Arthritis Daughter 59       osteo,  . Heart disease Son 33       stage 3 CHF(Diastolic /Systolic)  . Throat cancer Brother   . Hypertension Maternal Grandmother     Health Maintenance  Topic Date Due  . OPHTHALMOLOGY EXAM  Never done  . FOOT EXAM  01/28/2020  . COVID-19 Vaccine (4 - Booster for Moderna series) 06/13/2020  . HEMOGLOBIN A1C  09/29/2020  . TETANUS/TDAP  12/09/2023  . INFLUENZA VACCINE  Completed  . DEXA SCAN  Completed  . PNA vac Low Risk Adult  Completed  . HPV VACCINES  Aged Out  . URINE MICROALBUMIN  Discontinued    Allergies  Allergen Reactions  . Banana Nausea And Vomiting  . Codeine Nausea Only    unless given with Phenergan  . Klonopin [Clonazepam] Other (See Comments)    Causes hallucination   . Meperidine Hcl Nausea Only    unless given with Phenergan  . Norflex [Orphenadrine Citrate] Nausea Only    Unless given with Phenergan  . Oxycodone-Acetaminophen Nausea Only    unless given with phenergan  . Propoxyphene Hcl Nausea Only    unless given with phenergan  . Zoloft [Sertraline Hcl] Other (See Comments)    Caused lethargy  . Doxycycline Other (See Comments)  Unknown reaction  . Naproxen Other (See Comments)    Unknown reaction  . Penicillins Other (See Comments)    Unknown reaction Has patient had a PCN reaction causing immediate rash, facial/tongue/throat swelling, SOB or  lightheadedness with hypotension: Unknown Has patient had a PCN reaction causing severe rash involving mucus membranes or skin necrosis: Unknown Has patient had a PCN reaction that required hospitalization: pt was in the hospital at time of reaction Has patient had a PCN reaction occurring within the last 10 years: Unknown If all of the above answers are "NO", then may proceed with Cephalos  . Phenothiazines Other (See Comments)    Unknown reaction  . Stelazine Other (See Comments)    Unknown reaction  . Sulfamethoxazole-Trimethoprim Other (See Comments)    Unknown reaction  . Tolectin [Tolmetin Sodium] Other (See Comments)    Unknown reaction  . Tramadol Other (See Comments)    Unknown reaction    Outpatient Encounter Medications as of 05/04/2020  Medication Sig  . acetaminophen (TYLENOL) 500 MG tablet Take 1,000 mg by mouth 2 (two) times daily.  Marland Kitchen antiseptic oral rinse (BIOTENE) LIQD 15 mLs by Mouth Rinse route 5 (five) times daily as needed for dry mouth.  Marland Kitchen antiseptic oral rinse (BIOTENE) LIQD 15 mLs by Mouth Rinse route in the morning, at noon, in the evening, and at bedtime.  . Capsaicin-Menthol-Methyl Sal (CAPSAICIN-METHYL SAL-MENTHOL) 0.025-1-12 % CREA Apply 1 application topically 2 (two) times daily.  . carboxymethylcellulose (REFRESH PLUS) 0.5 % SOLN Place 1 drop into both eyes in the morning, at noon, in the evening, and at bedtime.  . cephALEXin (KEFLEX) 500 MG capsule Take 500 mg by mouth 2 (two) times daily. For 7 days for E-coli UTI  . CRANBERRY PO Take 2 tablets by mouth daily. AZO  . DULoxetine (CYMBALTA) 30 MG capsule Take 1 capsule by mouth daly for back pain and depression in combination with 60 mg tablet  . DULoxetine (CYMBALTA) 60 MG capsule Take 1 capsule by mouth once daily  . furosemide (LASIX) 40 MG tablet Take 1 tablet by mouth once daily  . JANUVIA 100 MG tablet Take 1 tablet by mouth once daily  . lansoprazole (PREVACID) 30 MG capsule TAKE 1 CAPSULE BY MOUTH  ONCE DAILY AT NOON  . LINZESS 290 MCG CAPS capsule TAKE 1 CAPSULE BY MOUTH EVERY DAY AT BEDTIME  . loratadine (CLARITIN) 10 MG tablet Take 10 mg by mouth daily.  . memantine (NAMENDA) 10 MG tablet Take 1 tablet by mouth twice daily  . MYRBETRIQ 50 MG TB24 tablet Take 1 tablet by mouth once daily  . pilocarpine (SALAGEN) 5 MG tablet Take 1 tablet by mouth twice daily  . polyethylene glycol (MIRALAX / GLYCOLAX) packet Take 17 g by mouth daily as needed for mild constipation (constipation). Mix with 8 oz. water or juice  . potassium chloride SA (KLOR-CON) 20 MEQ tablet Take 1 tablet by mouth once daily  . pregabalin (LYRICA) 100 MG capsule Take 1 capsule (100 mg total) by mouth 2 (two) times daily.  Marland Kitchen senna-docusate (SENNA S) 8.6-50 MG tablet Take 1 tablet by mouth daily.  Marland Kitchen glucose blood (ONETOUCH VERIO) test strip Use to test blood sugar daily. Dx:E08.65 (Patient not taking: No sig reported)  . Lancets Micro Thin 33G MISC Use to test blood sugar daily. Dx: E08.65 (Patient not taking: No sig reported)   No facility-administered encounter medications on file as of 05/04/2020.    Review of Systems  Constitutional: Negative for  chills, fever and weight loss.  HENT: Negative for congestion and sore throat.        Chronic dry mouth  Eyes: Negative for blurred vision.  Respiratory: Negative for cough and shortness of breath.   Cardiovascular: Negative for chest pain, palpitations and leg swelling.  Gastrointestinal: Positive for constipation and diarrhea. Negative for abdominal pain, blood in stool and melena.       IBS  Genitourinary: Negative for dysuria.  Musculoskeletal: Positive for falls and joint pain.  Skin: Negative for rash.  Neurological: Positive for weakness. Negative for dizziness and loss of consciousness.  Endo/Heme/Allergies: Bruises/bleeds easily.  Psychiatric/Behavioral: Positive for memory loss. Negative for depression. The patient is not nervous/anxious and does not have  insomnia.        Calm and content today    Vitals:   05/04/20 0815  BP: 125/60  Pulse: 88  Resp: 18  Temp: 98.1 F (36.7 C)  SpO2: 94%  Weight: 145 lb 3.2 oz (65.9 kg)  Height: 5\' 3"  (1.6 m)   Body mass index is 25.72 kg/m. Physical Exam Vitals reviewed.  Constitutional:      General: She is not in acute distress.    Appearance: Normal appearance. She is not toxic-appearing.  HENT:     Head:     Comments: Large ecchymoses of right side of face/temple    Right Ear: External ear normal.     Left Ear: External ear normal.     Nose: Nose normal.     Mouth/Throat:     Pharynx: Oropharynx is clear.  Eyes:     Extraocular Movements: Extraocular movements intact.     Conjunctiva/sclera: Conjunctivae normal.     Pupils: Pupils are equal, round, and reactive to light.  Cardiovascular:     Rate and Rhythm: Rhythm irregular.     Heart sounds: No murmur heard.   Pulmonary:     Effort: Pulmonary effort is normal.     Breath sounds: Normal breath sounds. No rales.  Abdominal:     General: Bowel sounds are normal. There is no distension.     Palpations: Abdomen is soft. There is no mass.     Tenderness: There is no abdominal tenderness. There is no guarding or rebound.  Musculoskeletal:        General: Normal range of motion.     Cervical back: Neck supple.     Right lower leg: No edema.     Left lower leg: Edema present.  Lymphadenopathy:     Cervical: No cervical adenopathy.  Skin:    General: Skin is warm and dry.     Capillary Refill: Capillary refill takes less than 2 seconds.     Findings: Bruising present.  Neurological:     General: No focal deficit present.     Mental Status: She is alert.     Sensory: Sensory deficit present.     Motor: Weakness present.     Gait: Gait abnormal.     Comments: Oriented to person, place, partially to time  Psychiatric:        Mood and Affect: Mood normal.     Comments: In good spirits and seems happy to see a familiar face      Labs reviewed: Basic Metabolic Panel: Recent Labs    04/23/20 1051 04/24/20 0547 04/25/20 0628  NA 139 134* 136  K 4.0 4.0 3.8  CL 101 98 102  CO2 27 25 25   GLUCOSE 137* 147* 161*  BUN 18  21 12  CREATININE 0.56 0.85 0.63  CALCIUM 9.8 9.3 9.6   Liver Function Tests: Recent Labs    05/20/19 1646  AST 21  ALT 26  ALKPHOS 90  BILITOT 0.4  PROT 7.4  ALBUMIN 4.1   Recent Labs    05/20/19 1646  LIPASE 40   No results for input(s): AMMONIA in the last 8760 hours. CBC: Recent Labs    05/20/19 1646 04/01/20 1614 04/23/20 1051 04/24/20 0547 04/25/20 0628  WBC 6.1 8.8 6.5 7.5 7.0  NEUTROABS 3.3 5,878  --   --   --   HGB 13.2 13.3 12.2 13.1 13.5  HCT 40.9 39.9 37.4 40.0 41.0  MCV 96.7 93.7 96.4 94.8 94.0  PLT 199 242 218 241 246   Cardiac Enzymes: No results for input(s): CKTOTAL, CKMB, CKMBINDEX, TROPONINI in the last 8760 hours. BNP: Invalid input(s): POCBNP Lab Results  Component Value Date   HGBA1C 6.8 (H) 04/01/2020   Lab Results  Component Value Date   TSH 1.35 09/04/2017   Lab Results  Component Value Date   ZOXWRUEA54 098 04/19/2015   No results found for: FOLATE Lab Results  Component Value Date   IRON 123 07/14/2011    Imaging and Procedures obtained prior to SNF admission: DG Ankle Complete Left  Result Date: 04/23/2020 CLINICAL DATA:  Left ankle pain after fall. EXAM: LEFT ANKLE COMPLETE - 3+ VIEW COMPARISON:  Left ankle x-rays dated April 16, 2020. FINDINGS: Acute nondisplaced fracture of the distal fibula again noted. No new fracture. No dislocation. The ankle mortise is symmetric. The talar dome is intact. Joint spaces are preserved. Osteopenia. Decreasing soft tissue swelling over the lateral malleolus. IMPRESSION: 1. Unchanged acute nondisplaced distal fibular fracture. No new fracture. Electronically Signed   By: Titus Dubin M.D.   On: 04/23/2020 11:56   CT Head Wo Contrast  Result Date: 04/23/2020 CLINICAL DATA:  Un  witnessed fall. EXAM: CT HEAD WITHOUT CONTRAST CT MAXILLOFACIAL WITHOUT CONTRAST CT CERVICAL SPINE WITHOUT CONTRAST TECHNIQUE: Multidetector CT imaging of the head, cervical spine, and maxillofacial structures were performed using the standard protocol without intravenous contrast. Multiplanar CT image reconstructions of the cervical spine and maxillofacial structures were also generated. COMPARISON:  CT cervical spine from 12/03/2014. CT head December 13, 2018. FINDINGS: CT HEAD FINDINGS Brain: Small volume of acute subarachnoid hemorrhage along the right frontal convexity (for example see series 4, image 22). Some hemorrhage layering within the anterior aspect of the right sylvian fissure and overlying the anterior right temporal convexity (series 6, image 12; series 4, image 14). No definite acute intraparenchymal hemorrhage. 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 Similar additional patchy white matter hypodensities, most likely related to chronic microvascular ischemic disease. No evidence of acute large vascular territory infarct. No hydrocephalus. No evidence of intraventricular hemorrhage. No mass lesion or abnormal mass effect. Vascular: Calcific atherosclerosis. No hyperdense vessel identified. Skull: No acute fracture. Other: No mastoid effusions. CT MAXILLOFACIAL FINDINGS Osseous: No fracture or mandibular dislocation. No destructive process. Chronic right posterior zygomatic cerclage wire. Orbits: No retro bulbar hematoma. Globes are symmetric and within normal limits. No proptosis. Sinuses: Chronic hypoplastic right maxillary sinus. The sinuses are largely clear. No air-fluid levels. Soft tissues: Right periorbital/malar soft tissue contusion. CT CERVICAL SPINE FINDINGS Alignment: Similar alignment. Mild levocurvature. No substantial sagittal subluxation. Skull base and vertebrae: Vertebral body heights are maintained. No evidence of acute fracture. Similar  degenerative endplate Schmorl's node involving the superior C4 endplate. Soft tissues  and spinal canal: No prevertebral fluid or swelling. No visible canal hematoma. Disc levels: Mild to moderate multilevel degenerative disc disease with disc height loss and endplate spurring. No evidence of significant bony canal stenosis. Upper chest: Biapical pleuroparenchymal scarring. Otherwise, visualized lung apices are clear. IMPRESSION: CT head: 1. Small volume of acute right frontotemporal subarachnoid hemorrhage, as detailed above. No mass effect. 2. Remote right subinsular and right frontal infarcts and chronic microvascular ischemic disease. CT maxillofacial 1. No acute fracture. 2. Right periorbital/malar contusion. CT cervical spine: 1. No evidence of acute fracture or traumatic malalignment. 2. Similar large left thyroid lesion, which was biopsied on Jun 18, 2013. Findings discussed with Dr. Tomi Bamberger via telephone at 12:40 PM. Electronically Signed   By: Margaretha Sheffield MD   On: 04/23/2020 12:43   CT Cervical Spine Wo Contrast  Result Date: 04/23/2020 CLINICAL DATA:  Un witnessed fall. EXAM: CT HEAD WITHOUT CONTRAST CT MAXILLOFACIAL WITHOUT CONTRAST CT CERVICAL SPINE WITHOUT CONTRAST TECHNIQUE: Multidetector CT imaging of the head, cervical spine, and maxillofacial structures were performed using the standard protocol without intravenous contrast. Multiplanar CT image reconstructions of the cervical spine and maxillofacial structures were also generated. COMPARISON:  CT cervical spine from 12/03/2014. CT head December 13, 2018. FINDINGS: CT HEAD FINDINGS Brain: Small volume of acute subarachnoid hemorrhage along the right frontal convexity (for example see series 4, image 22). Some hemorrhage layering within the anterior aspect of the right sylvian fissure and overlying the anterior right temporal convexity (series 6, image 12; series 4, image 14). No definite acute intraparenchymal hemorrhage. Similar prior right  subinsular and right parietal infarcts with encephalomalacia. Similar additional patchy white matter hypodensities, most likely related to chronic microvascular ischemic disease. No evidence of acute large vascular territory infarct. No hydrocephalus. No evidence of intraventricular hemorrhage. No mass lesion or abnormal mass effect. Vascular: Calcific atherosclerosis. No hyperdense vessel identified. Skull: No acute fracture. Other: No mastoid effusions. CT MAXILLOFACIAL FINDINGS Osseous: No fracture or mandibular dislocation. No destructive process. Chronic right posterior zygomatic cerclage wire. Orbits: No retro bulbar hematoma. Globes are symmetric and within normal limits. No proptosis. Sinuses: Chronic hypoplastic right maxillary sinus. The sinuses are largely clear. No air-fluid levels. Soft tissues: Right periorbital/malar soft tissue contusion. CT CERVICAL SPINE FINDINGS Alignment: Similar alignment. Mild levocurvature. No substantial sagittal subluxation. Skull base and vertebrae: Vertebral body heights are maintained. No evidence of acute fracture. Similar degenerative endplate Schmorl's node involving the superior C4 endplate. Soft tissues and spinal canal: No prevertebral fluid or swelling. No visible canal hematoma. Disc levels: Mild to moderate multilevel degenerative disc disease with disc height loss and endplate spurring. No evidence of significant bony canal stenosis. Upper chest: Biapical pleuroparenchymal scarring. Otherwise, visualized lung apices are clear. IMPRESSION: CT head: 1. Small volume of acute right frontotemporal subarachnoid hemorrhage, as detailed above. No mass effect. 2. Remote right subinsular and right frontal infarcts and chronic microvascular ischemic disease. CT maxillofacial 1. No acute fracture. 2. Right periorbital/malar contusion. CT cervical spine: 1. No evidence of acute fracture or traumatic malalignment. 2. Similar large left thyroid lesion, which was biopsied on  Jun 18, 2013. Findings discussed with Dr. Tomi Bamberger via telephone at 12:40 PM. Electronically Signed   By: Margaretha Sheffield MD   On: 04/23/2020 12:43   DG Chest Portable 1 View  Result Date: 04/23/2020 CLINICAL DATA:  Fall. EXAM: PORTABLE CHEST 1 VIEW COMPARISON:  Chest x-ray dated December 16, 2019. FINDINGS: The heart is at the upper limits of normal in size.  Normal pulmonary vascularity. No focal consolidation, pleural effusion, or pneumothorax. No acute osseous abnormality. IMPRESSION: No active disease. Electronically Signed   By: Titus Dubin M.D.   On: 04/23/2020 12:00   DG Shoulder Left  Result Date: 04/23/2020 CLINICAL DATA:  Left shoulder pain after fall. EXAM: LEFT SHOULDER - 2+ VIEW COMPARISON:  Left shoulder x-rays dated Jul 11, 2019. FINDINGS: No acute fracture or dislocation. Unchanged mild acromioclavicular osteoarthritis. Soft tissues are unremarkable. IMPRESSION: 1. No acute osseous abnormality. Electronically Signed   By: Titus Dubin M.D.   On: 04/23/2020 11:57   DG Hip Unilat W or Wo Pelvis 2-3 Views Left  Result Date: 04/23/2020 CLINICAL DATA:  Left hip pain after fall. EXAM: DG HIP (WITH OR WITHOUT PELVIS) 2-3V LEFT COMPARISON:  Left hip x-rays dated April 16, 2020. FINDINGS: No acute fracture or dislocation. Unchanged mild bilateral hip osteoarthritis. Osteopenia. Soft tissues are unremarkable. IMPRESSION: 1. No acute osseous abnormality. Electronically Signed   By: Titus Dubin M.D.   On: 04/23/2020 11:58   VAS [place] LOWER EXTREMITY VENOUS (DVT)  Result Date: 04/24/2020  Lower Venous DVT Study Indications: Swelling.  Limitations: Body habitus and poor ultrasound/tissue interface. Comparison Study: No prior studies. Performing Technologist: Oliver Hum RVT  Examination Guidelines: A complete evaluation includes B-mode imaging, spectral Doppler, color Doppler, and power Doppler as needed of all accessible portions of each vessel. Bilateral testing is considered an integral  part of a complete examination. Limited examinations for reoccurring indications may be performed as noted. The reflux portion of the exam is performed with the patient in reverse Trendelenburg.  +-----+---------------+---------+-----------+----------+--------------+ RIGHTCompressibilityPhasicitySpontaneityPropertiesThrombus Aging +-----+---------------+---------+-----------+----------+--------------+ CFV  Full           Yes      Yes                                 +-----+---------------+---------+-----------+----------+--------------+   +---------+---------------+---------+-----------+----------+--------------+ LEFT     CompressibilityPhasicitySpontaneityPropertiesThrombus Aging +---------+---------------+---------+-----------+----------+--------------+ CFV      Full           Yes      Yes                                 +---------+---------------+---------+-----------+----------+--------------+ SFJ      Full                                                        +---------+---------------+---------+-----------+----------+--------------+ FV Prox  Full                                                        +---------+---------------+---------+-----------+----------+--------------+ FV Mid   Full                                                        +---------+---------------+---------+-----------+----------+--------------+ FV DistalFull                                                        +---------+---------------+---------+-----------+----------+--------------+  PFV      Full                                                        +---------+---------------+---------+-----------+----------+--------------+ POP      Full           Yes      Yes                                 +---------+---------------+---------+-----------+----------+--------------+ PTV      Full                                                         +---------+---------------+---------+-----------+----------+--------------+ PERO     Full                                                        +---------+---------------+---------+-----------+----------+--------------+     Summary: RIGHT: - No evidence of common femoral vein obstruction.  LEFT: - There is no evidence of deep vein thrombosis in the lower extremity. However, portions of this examination were limited- see technologist comments above.  - No cystic structure found in the popliteal fossa.  *See table(s) above for measurements and observations. Electronically signed by Harold Barban MD on 04/24/2020 at 4:18:57 PM.    Final    CT Maxillofacial Wo Contrast  Result Date: 04/23/2020 CLINICAL DATA:  Un witnessed fall. EXAM: CT HEAD WITHOUT CONTRAST CT MAXILLOFACIAL WITHOUT CONTRAST CT CERVICAL SPINE WITHOUT CONTRAST TECHNIQUE: Multidetector CT imaging of the head, cervical spine, and maxillofacial structures were performed using the standard protocol without intravenous contrast. Multiplanar CT image reconstructions of the cervical spine and maxillofacial structures were also generated. COMPARISON:  CT cervical spine from 12/03/2014. CT head December 13, 2018. FINDINGS: CT HEAD FINDINGS Brain: Small volume of acute subarachnoid hemorrhage along the right frontal convexity (for example see series 4, image 22). Some hemorrhage layering within the anterior aspect of the right sylvian fissure and overlying the anterior right temporal convexity (series 6, image 12; series 4, image 14). No definite acute intraparenchymal hemorrhage. 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 Similar additional patchy white matter hypodensities, most likely related to chronic microvascular ischemic disease. No evidence of acute large vascular territory infarct. No hydrocephalus. No evidence of intraventricular hemorrhage. No mass lesion or abnormal mass effect. Vascular: Calcific  atherosclerosis. No hyperdense vessel identified. Skull: No acute fracture. Other: No mastoid effusions. CT MAXILLOFACIAL FINDINGS Osseous: No fracture or mandibular dislocation. No destructive process. Chronic right posterior zygomatic cerclage wire. Orbits: No retro bulbar hematoma. Globes are symmetric and within normal limits. No proptosis. Sinuses: Chronic hypoplastic right maxillary sinus. The sinuses are largely clear. No air-fluid levels. Soft tissues: Right periorbital/malar soft tissue contusion. CT CERVICAL SPINE FINDINGS Alignment: Similar alignment. Mild levocurvature. No substantial sagittal subluxation. Skull base and vertebrae: Vertebral body heights are maintained. No evidence of acute fracture. Similar degenerative endplate Schmorl's node involving  the superior C4 endplate. Soft tissues and spinal canal: No prevertebral fluid or swelling. No visible canal hematoma. Disc levels: Mild to moderate multilevel degenerative disc disease with disc height loss and endplate spurring. No evidence of significant bony canal stenosis. Upper chest: Biapical pleuroparenchymal scarring. Otherwise, visualized lung apices are clear. IMPRESSION: CT head: 1. Small volume of acute right frontotemporal subarachnoid hemorrhage, as detailed above. No mass effect. 2. Remote right subinsular and right frontal infarcts and chronic microvascular ischemic disease. CT maxillofacial 1. No acute fracture. 2. Right periorbital/malar contusion. CT cervical spine: 1. No evidence of acute fracture or traumatic malalignment. 2. Similar large left thyroid lesion, which was biopsied on Jun 18, 2013. Findings discussed with Dr. Tomi Bamberger via telephone at 12:40 PM. Electronically Signed   By: Margaretha Sheffield MD   On: 04/23/2020 12:43    Assessment/Plan 1. Subarachnoid hemorrhage (HCC) -was small and does not seem to have cause any neurologic changes  2. Closed avulsion fracture of distal end of left fibula, sequela -cont boot and f/u  with ortho as planned--Dr. Sharol Given, PA Persons -pain appears controlled with current regimen, try to minimize opioids  3. Recurrent falls -at home due to having time alone there, lacks insight and judgment related to her dementia, also weak and unsteady/deconditioned  4. Vascular dementia without behavioral disturbance ([place]) -should not have time alone at home even nights and weekends -cont namenda  5. Bipolar affective disorder, depressed in partial remission ([place]) -cont cymbalta therapy high dose which has been effective (has not tolerated less of this though it's been tried more than once outpatient)  6. Sjogren's syndrome with keratoconjunctivitis sicca (HCC) -cont biotene, long-term pilocarpine per rheum -add a course of magic mouthwash for pain  7. Controlled type 2 diabetes mellitus with diabetic neuropathy, without long-term current use of insulin ([place]) -cont januvia, fingersticks are being done and supplies should not be removed from list in case she discharged b/c then no one will no what kind she needs for home  8. Irritable bowel syndrome with both constipation and diarrhea -cont linzess, prn miralax for constipation which has bene the bigger issue recently for her  9. PAF (paroxysmal atrial fibrillation) (HCC) -not on rate control or anticoagulation--falls regularly  10. Chronic midline low back pain, unspecified whether sciatica present -has historically taken norco for periods for this, cont lyrica and cymbalta  11.  Overactive bladder -cont myrbetriq therapy  Family/ staff Communication: d/w snf nurse and caregiver present in room  Labs/tests ordered:  Cbc, bmp in a week  Robbie Rideaux L. Binta Statzer, D.O. [facility] [address], [place] [address] Cell Phone (Mon-Fri 8am-5pm):  [phone] On Call:  [phone] & follow prompts after 5pm & weekends Office Phone:  [phone] Office Fax:  [phone]

## 2020-05-12 ENCOUNTER — Encounter: Payer: Self-pay | Admitting: Orthopedic Surgery

## 2020-05-12 ENCOUNTER — Non-Acute Institutional Stay (SKILLED_NURSING_FACILITY): Payer: Medicare Other | Admitting: Orthopedic Surgery

## 2020-05-12 DIAGNOSIS — E1140 Type 2 diabetes mellitus with diabetic neuropathy, unspecified: Secondary | ICD-10-CM

## 2020-05-12 DIAGNOSIS — I480 Paroxysmal atrial fibrillation: Secondary | ICD-10-CM

## 2020-05-12 DIAGNOSIS — J302 Other seasonal allergic rhinitis: Secondary | ICD-10-CM

## 2020-05-12 DIAGNOSIS — K219 Gastro-esophageal reflux disease without esophagitis: Secondary | ICD-10-CM

## 2020-05-12 DIAGNOSIS — K582 Mixed irritable bowel syndrome: Secondary | ICD-10-CM

## 2020-05-12 DIAGNOSIS — K2100 Gastro-esophageal reflux disease with esophagitis, without bleeding: Secondary | ICD-10-CM

## 2020-05-12 DIAGNOSIS — M3501 Sicca syndrome with keratoconjunctivitis: Secondary | ICD-10-CM

## 2020-05-12 DIAGNOSIS — R296 Repeated falls: Secondary | ICD-10-CM | POA: Diagnosis not present

## 2020-05-12 DIAGNOSIS — S82832S Other fracture of upper and lower end of left fibula, sequela: Secondary | ICD-10-CM | POA: Diagnosis not present

## 2020-05-12 DIAGNOSIS — I10 Essential (primary) hypertension: Secondary | ICD-10-CM

## 2020-05-12 DIAGNOSIS — F3175 Bipolar disorder, in partial remission, most recent episode depressed: Secondary | ICD-10-CM

## 2020-05-12 DIAGNOSIS — G8929 Other chronic pain: Secondary | ICD-10-CM

## 2020-05-12 DIAGNOSIS — F0150 Vascular dementia without behavioral disturbance: Secondary | ICD-10-CM

## 2020-05-12 DIAGNOSIS — E119 Type 2 diabetes mellitus without complications: Secondary | ICD-10-CM

## 2020-05-12 DIAGNOSIS — M545 Low back pain, unspecified: Secondary | ICD-10-CM

## 2020-05-12 DIAGNOSIS — I609 Nontraumatic subarachnoid hemorrhage, unspecified: Secondary | ICD-10-CM | POA: Diagnosis not present

## 2020-05-12 DIAGNOSIS — G6289 Other specified polyneuropathies: Secondary | ICD-10-CM

## 2020-05-12 DIAGNOSIS — N3281 Overactive bladder: Secondary | ICD-10-CM

## 2020-05-12 MED ORDER — ONETOUCH VERIO VI STRP: ORAL_STRIP | 3 refills | Status: DC

## 2020-05-12 MED ORDER — PREGABALIN 100 MG PO CAPS: 100.0000 mg | ORAL_CAPSULE | Freq: Two times a day (BID) | ORAL | 0 refills | Status: DC

## 2020-05-12 MED ORDER — DULOXETINE HCL 30 MG PO CPEP: 30.0000 mg | ORAL_CAPSULE | Freq: Every day | ORAL | 0 refills | Status: DC

## 2020-05-12 MED ORDER — FUROSEMIDE 40 MG PO TABS: 40.0000 mg | ORAL_TABLET | Freq: Every day | ORAL | 0 refills | Status: DC

## 2020-05-12 MED ORDER — POTASSIUM CHLORIDE CRYS ER 20 MEQ PO TBCR
20.0000 meq | EXTENDED_RELEASE_TABLET | Freq: Every day | ORAL | 0 refills | Status: DC
Start: 2020-05-12 — End: 2020-05-21

## 2020-05-12 MED ORDER — LORATADINE 10 MG PO TABS
10.0000 mg | ORAL_TABLET | Freq: Every day | ORAL | 0 refills | Status: DC
Start: 2020-05-12 — End: 2023-04-17

## 2020-05-12 MED ORDER — MEMANTINE HCL 10 MG PO TABS: 10.0000 mg | ORAL_TABLET | Freq: Two times a day (BID) | ORAL | 0 refills | Status: DC

## 2020-05-12 MED ORDER — MIRABEGRON ER 50 MG PO TB24: 50.0000 mg | ORAL_TABLET | Freq: Every day | ORAL | 0 refills | Status: DC

## 2020-05-12 MED ORDER — SITAGLIPTIN PHOSPHATE 100 MG PO TABS: 100.0000 mg | ORAL_TABLET | Freq: Every day | ORAL | 0 refills | Status: DC

## 2020-05-12 MED ORDER — DULOXETINE HCL 60 MG PO CPEP: 60.0000 mg | ORAL_CAPSULE | Freq: Every day | ORAL | 0 refills | Status: DC

## 2020-05-12 MED ORDER — SENNOSIDES-DOCUSATE SODIUM 8.6-50 MG PO TABS: 1.0000 | ORAL_TABLET | Freq: Every day | ORAL | 0 refills | Status: DC

## 2020-05-12 MED ORDER — LANCETS MICRO THIN 33G MISC: 3 refills | Status: DC

## 2020-05-12 MED ORDER — PILOCARPINE HCL 5 MG PO TABS: 5.0000 mg | ORAL_TABLET | Freq: Two times a day (BID) | ORAL | 0 refills | Status: DC

## 2020-05-12 MED ORDER — LANSOPRAZOLE 30 MG PO CPDR: DELAYED_RELEASE_CAPSULE | ORAL | 0 refills | Status: DC

## 2020-05-12 MED ORDER — LINACLOTIDE 290 MCG PO CAPS
ORAL_CAPSULE | ORAL | 0 refills | Status: DC
Start: 2020-05-12 — End: 2020-06-21

## 2020-05-12 NOTE — Progress Notes (Signed)
Location:    [facility] Room Number: 109/D Place of Service:  SNF [phone])  Provider: Windell Moulding NP  PCP: Gayland Curry, DO Patient Care Team: Gayland Curry, DO as PCP - General (Geriatric Medicine) Noralee Space, MD as Consulting Physician (Pulmonary Disease) Maisie Fus, MD as Consulting Physician (Obstetrics and Gynecology) Debara Pickett Nadean Corwin, MD as Consulting Physician (Cardiology) Monna Fam, MD as Consulting Physician (Ophthalmology) Kathrynn Ducking, MD as Consulting Physician (Neurology) Franchot Gallo, MD as Consulting Physician (Urology) Lavonna Monarch, MD as Consulting Physician (Dermatology) Gatha Mayer, MD as Consulting Physician (Gastroenterology) Estill Dooms as Social Worker Yvonna Alanis, NP as Nurse Practitioner (Adult Health Nurse Practitioner)  Extended Emergency Contact Information Primary Emergency Contact: Era Skeen Address: [address]          [place], [place] [address] Johnnette Litter of [place] Phone: [phone] Relation: Daughter Secondary Emergency Contact: Arvin Collard [place] of [place] Mobile Phone: [phone] Relation: Other  Code Status: Full Code Goals of care:  Advanced Directive information Advanced Directives 05/12/2020  Does Patient Have a Medical Advance Directive? No  Type of Advance Directive -  Does patient want to make changes to medical advance directive? No - Patient declined  Copy of [place] in Chart? -  Would patient like information on creating a medical advance directive? -  Pre-existing out of facility DNR order (yellow form or pink MOST form) -     Allergies  Allergen Reactions  . Banana Nausea And Vomiting  . Codeine Nausea Only    unless given with Phenergan  . Klonopin [Clonazepam] Other (See Comments)    Causes hallucination   . Meperidine Hcl Nausea Only    unless given with Phenergan  . Norflex  [Orphenadrine Citrate] Nausea Only    Unless given with Phenergan  . Oxycodone-Acetaminophen Nausea Only    unless given with phenergan  . Propoxyphene Hcl Nausea Only    unless given with phenergan  . Zoloft [Sertraline Hcl] Other (See Comments)    Caused lethargy  . Doxycycline Other (See Comments)    Unknown reaction  . Naproxen Other (See Comments)    Unknown reaction  . Penicillins Other (See Comments)    Unknown reaction Has patient had a PCN reaction causing immediate rash, facial/tongue/throat swelling, SOB or lightheadedness with hypotension: Unknown Has patient had a PCN reaction causing severe rash involving mucus membranes or skin necrosis: Unknown Has patient had a PCN reaction that required hospitalization: pt was in the hospital at time of reaction Has patient had a PCN reaction occurring within the last 10 years: Unknown If all of the above answers are "NO", then may proceed with Cephalos  . Phenothiazines Other (See Comments)    Unknown reaction  . Stelazine Other (See Comments)    Unknown reaction  . Sulfamethoxazole-Trimethoprim Other (See Comments)    Unknown reaction  . Tolectin [Tolmetin Sodium] Other (See Comments)    Unknown reaction  . Tramadol Other (See Comments)    Unknown reaction    Chief Complaint  Patient presents with  . Discharge Note    Discharge Visit     HPI:  87 y.o. female seen today for discharge evaluation.   She currently resides at [facility], seen at bedside today. PMH includes: atrial fibrillation, hypertension, TIA, GERD, IBS, Sjogren's syndrome, type 2 diabetes with neuropathy, multinodular thyroid, dementia with behavioral disturbance, osteopenia, and frequent  falls.   She was hospitalized at [facility]/11-03/15 for acute non-displaced distal fibular fracture and frontotemporal subarachnoid hemorrhage due to fall at home. Polypharmacy and comorbidities suspected as contributing factors  for fall. Xanax and hydrocodone discontinued. Blood thinners held due to recurrent falls. Cardizem also discontinued.   Today, she is sitting in her bed, home caregiver present during encounter. She is alert to self, place and person. Follows commands and can express needs. Ambulating about 50 ft with walker. Touching assistance with ADLs. Bm this morning. Appetite fair. Bruising to her right cheek and eye almost healed. She was seen by orthopedics 03/22, advised to begin WBAT and may transition out of boot to supportive sneaker in one week. Recommend 1 month follow up. Pain to right leg tolerable with tylenol 1000 mg bid. She denies chest pain, sob or calf pain.   Recently given Keflex x 7 days for UTI. Urinary symptoms have resolved.   Blood sugars averaging 100-200's. No hypoglycemic events.   No recent falls or injuries.   Facility nurse does not report any concerns, vitals stable.   She plan to discharge home 05/14/20. Caregiver will assist with transportation. I have advised her to schedule f/u with new provider at [facility] in 1-2 weeks.   Past Medical History:  Diagnosis Date  . Abnormality of gait   . Adenomatous polyp of colon 2002   31mm  . Allergic rhinitis   . Anxiety   . Anxiety and depression   . Chronic back pain   . Dementia without behavioral disturbance ([place])   . Depression   . Diabetes ([place])   . Diverticulosis of colon   . Dry eye syndrome   . Dysphagia   . Dysthymic disorder   . Fall   . Fibromyalgia   . GERD (gastroesophageal reflux disease)   . H/O hiatal hernia   . History of adverse drug reaction   . History of cerebrovascular disease 09/24/2014  . History of recurrent UTIs   . Hypertension, benign   . Irritable bowel syndrome   . Low back pain syndrome   . Memory loss   . Mitral valve prolapse   . Paroxysmal A-fib ([place])   . Peripheral neuropathy    "both feet and legs"  . Physical deconditioning   . Sjogren's syndrome ([place])   . Therapeutic  opioid-induced constipation ([facility])   . Thyroid nodule   . Urinary incontinence     Past Surgical History:  Procedure Laterality Date  . ABDOMINAL HYSTERECTOMY  1967  . APPENDECTOMY    . CARDIAC CATHETERIZATION  02/17/2003   normal L main, LAD free of disease, Cfx free of disease, RCA free of disease (Dr. Loni Muse. [place])  . CATARACT EXTRACTION, BILATERAL    . CHOLECYSTECTOMY  2000  . COLONOSCOPY W/ BIOPSIES     multiple  . DENTAL SURGERY     multiple tooth extractions  . ESOPHAGOGASTRODUODENOSCOPY (EGD) WITH ESOPHAGEAL DILATION N/A 08/23/2012   Procedure: ESOPHAGOGASTRODUODENOSCOPY (EGD) WITH ESOPHAGEAL DILATION;  Surgeon: Milus Banister, MD;  Location: [facility];  Service: Endoscopy;  Laterality: N/A;  . NASAL SEPTUM SURGERY  1980  . NM MYOCAR PERF WALL MOTION  2003   persantine - normal static and dynamic study w/apical thinning and presvered LV function, no ischemia  . SINUS EXPLORATION     ossifiying fibroma  . TEMPOROMANDIBULAR JOINT SURGERY  1986   Dr. Terence Lux  . TRANSTHORACIC ECHOCARDIOGRAM  2001   mild LVH, normal LV  reports that she has quit smoking. She has never used smokeless tobacco. She reports that she does not drink alcohol and does not use drugs. Social History   Socioeconomic History  . Marital status: Widowed    Spouse name: Not on file  . Number of children: 2  . Years of education: 65  . Highest education level: Not on file  Occupational History  . Occupation: Retired  Tobacco Use  . Smoking status: Former Research scientist (life sciences)  . Smokeless tobacco: Never Used  . Tobacco comment: Quit at age 72   Vaping Use  . Vaping Use: Never used  Substance and Sexual Activity  . Alcohol use: No  . Drug use: Never  . Sexual activity: Never  Other Topics Concern  . 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    Patient is Widowed.    Patient has 2 children.    Patient is retired.    Former smoker   Alcohol none    Exercise Walk, exercise chair 4 days a week   POA    Walks with cane      Patient drinks about 1-2 cups of hot tea daily.   Patient is right handed.               Social Determinants of Health   Financial Resource Strain: Not on file  Food Insecurity: Not on file  Transportation Needs: Not on file  Physical Activity: Not on file  Stress: Not on file  Social Connections: Not on file  Intimate Partner Violence: Not on file   Functional Status Survey:    Allergies  Allergen Reactions  . Banana Nausea And Vomiting  . Codeine Nausea Only    unless given with Phenergan  . Klonopin [Clonazepam] Other (See Comments)    Causes hallucination   . Meperidine Hcl Nausea Only    unless given with Phenergan  . Norflex [Orphenadrine Citrate] Nausea Only    Unless given with Phenergan  . Oxycodone-Acetaminophen Nausea Only    unless given with phenergan  . Propoxyphene Hcl Nausea Only    unless given with phenergan  . Zoloft [Sertraline Hcl] Other (See Comments)    Caused lethargy  . Doxycycline Other (See Comments)    Unknown reaction  . Naproxen Other (See Comments)    Unknown reaction  . Penicillins Other (See Comments)    Unknown reaction Has patient had a PCN reaction causing immediate rash, facial/tongue/throat swelling, SOB or lightheadedness with hypotension: Unknown Has patient had a PCN reaction causing severe rash involving mucus membranes or skin necrosis: Unknown Has patient had a PCN reaction that required hospitalization: pt was in the hospital at time of reaction Has patient had a PCN reaction occurring within the last 10 years: Unknown If all of the above answers are "NO", then may proceed with Cephalos  . Phenothiazines Other (See Comments)    Unknown reaction  . Stelazine Other (See Comments)    Unknown reaction  . Sulfamethoxazole-Trimethoprim Other (See Comments)    Unknown reaction  . Tolectin [Tolmetin Sodium] Other (See Comments)    Unknown reaction   . Tramadol Other (See Comments)    Unknown reaction    Pertinent  Health Maintenance Due  Topic Date Due  . OPHTHALMOLOGY EXAM  Never done  . FOOT EXAM  01/28/2020  . HEMOGLOBIN A1C  09/29/2020  . INFLUENZA VACCINE  Completed  . DEXA SCAN  Completed  .  PNA vac Low Risk Adult  Completed  . URINE MICROALBUMIN  Discontinued    Medications: Allergies as of 05/12/2020      Reactions   Banana Nausea And Vomiting   Codeine Nausea Only   unless given with Phenergan   Klonopin [clonazepam] Other (See Comments)   Causes hallucination   Meperidine Hcl Nausea Only   unless given with Phenergan   Norflex [orphenadrine Citrate] Nausea Only   Unless given with Phenergan   Oxycodone-acetaminophen Nausea Only   unless given with phenergan   Propoxyphene Hcl Nausea Only   unless given with phenergan   Zoloft [sertraline Hcl] Other (See Comments)   Caused lethargy   Doxycycline Other (See Comments)   Unknown reaction   Naproxen Other (See Comments)   Unknown reaction   Penicillins Other (See Comments)   Unknown reaction Has patient had a PCN reaction causing immediate rash, facial/tongue/throat swelling, SOB or lightheadedness with hypotension: Unknown Has patient had a PCN reaction causing severe rash involving mucus membranes or skin necrosis: Unknown Has patient had a PCN reaction that required hospitalization: pt was in the hospital at time of reaction Has patient had a PCN reaction occurring within the last 10 years: Unknown If all of the above answers are "NO", then may proceed with Cephalos   Phenothiazines Other (See Comments)   Unknown reaction   Stelazine Other (See Comments)   Unknown reaction   Sulfamethoxazole-trimethoprim Other (See Comments)   Unknown reaction   Tolectin [tolmetin Sodium] Other (See Comments)   Unknown reaction   Tramadol Other (See Comments)   Unknown reaction      Medication List       Accurate as of May 12, 2020  9:31 AM. If you have any  questions, ask your nurse or doctor.        STOP taking these medications   cephALEXin 500 MG capsule Commonly known as: KEFLEX Stopped by: Yvonna Alanis, NP     TAKE these medications   acetaminophen 500 MG tablet Commonly known as: TYLENOL Take 1,000 mg by mouth 2 (two) times daily.   antiseptic oral rinse Liqd 15 mLs by Mouth Rinse route 5 (five) times daily as needed for dry mouth.   antiseptic oral rinse Liqd 15 mLs by Mouth Rinse route in the morning, at noon, in the evening, and at bedtime.   capsaicin-methyl sal-menthol 0.025-1-12 % Crea Generic drug: Capsaicin-Menthol-Methyl Sal Apply 1 application topically 2 (two) times daily.   carboxymethylcellulose 0.5 % Soln Commonly known as: REFRESH PLUS Place 1 drop into both eyes in the morning, at noon, in the evening, and at bedtime.   CRANBERRY PO Take 2 tablets by mouth daily. AZO   DULoxetine 30 MG capsule Commonly known as: CYMBALTA Take 30 mg by mouth daily. Take along with 60 mg to = 90 mg for back pain and depression What changed: Another medication with the same name was removed. Continue taking this medication, and follow the directions you see here. Changed by: Yvonna Alanis, NP   DULoxetine 60 MG capsule Commonly known as: CYMBALTA Take 60 mg by mouth daily. Take along with 30 mg to = 90 mg for back pain and Depression What changed: Another medication with the same name was removed. Continue taking this medication, and follow the directions you see here. Changed by: Yvonna Alanis, NP   FIRST-DUKES MOUTHWASH MT Use as directed in the mouth or throat. 1 tsp gargle and swallow QID x 7 days for oral  pain   furosemide 40 MG tablet Commonly known as: LASIX Take 1 tablet by mouth once daily   Januvia 100 MG tablet Generic drug: sitaGLIPtin Take 1 tablet by mouth once daily   Lancets Micro Thin 33G Misc Use to test blood sugar daily. Dx: E08.65   lansoprazole 30 MG capsule Commonly known as: PREVACID TAKE  1 CAPSULE BY MOUTH ONCE DAILY AT NOON   Linzess 290 MCG Caps capsule Generic drug: linaclotide TAKE 1 CAPSULE BY MOUTH EVERY DAY AT BEDTIME   loratadine 10 MG tablet Commonly known as: CLARITIN Take 10 mg by mouth daily.   memantine 10 MG tablet Commonly known as: NAMENDA Take 1 tablet by mouth twice daily   Myrbetriq 50 MG Tb24 tablet Generic drug: mirabegron ER Take 1 tablet by mouth once daily   OneTouch Verio test strip Generic drug: glucose blood Use to test blood sugar daily. Dx:E08.65   pilocarpine 5 MG tablet Commonly known as: SALAGEN Take 1 tablet by mouth twice daily   polyethylene glycol 17 g packet Commonly known as: MIRALAX / GLYCOLAX Take 17 g by mouth daily as needed for mild constipation (constipation). Mix with 8 oz. water or juice   potassium chloride SA 20 MEQ tablet Commonly known as: KLOR-CON Take 1 tablet by mouth once daily   pregabalin 100 MG capsule Commonly known as: LYRICA Take 1 capsule (100 mg total) by mouth 2 (two) times daily.   senna-docusate 8.6-50 MG tablet Commonly known as: Senna S Take 1 tablet by mouth daily.       Review of Systems  Constitutional: Negative for activity change, appetite change, fatigue and fever.  HENT: Positive for hearing loss. Negative for congestion, dental problem, facial swelling and trouble swallowing.   Eyes: Negative for visual disturbance.  Respiratory: Negative for cough, shortness of breath and wheezing.   Cardiovascular: Negative for chest pain and leg swelling.  Gastrointestinal: Negative for abdominal distention, abdominal pain, constipation, diarrhea and nausea.  Genitourinary: Negative for dysuria, frequency and hematuria.  Musculoskeletal: Positive for arthralgias and myalgias.       Right knee pain  Skin:       Facial bruising  Neurological: Positive for weakness. Negative for dizziness and headaches.  Hematological: Bruises/bleeds easily.  Psychiatric/Behavioral: Positive for  confusion. Negative for dysphoric mood. The patient is not nervous/anxious.     Vitals:   05/12/20 0859  BP: 135/76  Pulse: 83  Resp: 18  Temp: 97.7 F (36.5 C)  SpO2: 94%  Weight: 131 lb 3.2 oz (59.5 kg)  Height: 5\' 3"  (1.6 m)   Body mass index is 23.24 kg/m. Physical Exam Vitals reviewed.  Constitutional:      General: She is not in acute distress. HENT:     Head: Normocephalic.     Right Ear: There is no impacted cerumen.     Left Ear: There is no impacted cerumen.     Nose: Nose normal.     Mouth/Throat:     Mouth: Mucous membranes are moist.     Pharynx: No posterior oropharyngeal erythema.  Eyes:     General:        Right eye: No discharge.        Left eye: No discharge.  Cardiovascular:     Rate and Rhythm: Normal rate. Rhythm irregular.     Pulses: Normal pulses.     Heart sounds: Normal heart sounds. No murmur heard.   Pulmonary:     Effort: Pulmonary effort is normal.  No respiratory distress.     Breath sounds: Normal breath sounds. No wheezing.  Abdominal:     General: Bowel sounds are normal. There is no distension.     Palpations: Abdomen is soft.     Tenderness: There is no abdominal tenderness.  Musculoskeletal:     Cervical back: Normal range of motion.     Right lower leg: No edema.     Left lower leg: No edema.     Comments: Bilateral Dorsalflexion 5/5, FROM. Pedal pulses 1+.   Lymphadenopathy:     Cervical: No cervical adenopathy.  Skin:    General: Skin is warm and dry.     Capillary Refill: Capillary refill takes less than 2 seconds.     Findings: Bruising present.     Comments: Right cheek with mild yellow bruising.   Neurological:     General: No focal deficit present.     Mental Status: She is alert. Mental status is at baseline.     Motor: Weakness present.     Gait: Gait abnormal.     Comments: walker  Psychiatric:        Mood and Affect: Mood normal.        Behavior: Behavior normal.        Cognition and Memory: Memory is  impaired.     Labs reviewed: Basic Metabolic Panel: Recent Labs    04/23/20 1051 04/24/20 0547 04/25/20 0628  NA 139 134* 136  K 4.0 4.0 3.8  CL 101 98 102  CO2 27 25 25   GLUCOSE 137* 147* 161*  BUN 18 21 12   CREATININE 0.56 0.85 0.63  CALCIUM 9.8 9.3 9.6   Liver Function Tests: Recent Labs    05/20/19 1646  AST 21  ALT 26  ALKPHOS 90  BILITOT 0.4  PROT 7.4  ALBUMIN 4.1   Recent Labs    05/20/19 1646  LIPASE 40   No results for input(s): AMMONIA in the last 8760 hours. CBC: Recent Labs    05/20/19 1646 04/01/20 1614 04/23/20 1051 04/24/20 0547 04/25/20 0628  WBC 6.1 8.8 6.5 7.5 7.0  NEUTROABS 3.3 5,878  --   --   --   HGB 13.2 13.3 12.2 13.1 13.5  HCT 40.9 39.9 37.4 40.0 41.0  MCV 96.7 93.7 96.4 94.8 94.0  PLT 199 242 218 241 246   Cardiac Enzymes: No results for input(s): CKTOTAL, CKMB, CKMBINDEX, TROPONINI in the last 8760 hours. BNP: Invalid input(s): POCBNP CBG: Recent Labs    04/27/20 1150 04/27/20 1700 04/27/20 2041  GLUCAP 184* 124* 156*    Procedures and Imaging Studies During Stay: DG Ankle Complete Left  Result Date: 04/23/2020 CLINICAL DATA:  Left ankle pain after fall. EXAM: LEFT ANKLE COMPLETE - 3+ VIEW COMPARISON:  Left ankle x-rays dated April 16, 2020. FINDINGS: Acute nondisplaced fracture of the distal fibula again noted. No new fracture. No dislocation. The ankle mortise is symmetric. The talar dome is intact. Joint spaces are preserved. Osteopenia. Decreasing soft tissue swelling over the lateral malleolus. IMPRESSION: 1. Unchanged acute nondisplaced distal fibular fracture. No new fracture. Electronically Signed   By: Titus Dubin M.D.   On: 04/23/2020 11:56   DG Ankle Complete Left  Result Date: 04/16/2020 CLINICAL DATA:  Left ankle pain after a fall, initial encounter. EXAM: LEFT ANKLE COMPLETE - 3+ VIEW COMPARISON:  None. FINDINGS: Nondisplaced distal fibular fracture with overlying soft tissue swelling. Ankle mortise is  intact. No additional evidence of an acute fracture. There  is a joint effusion in association. IMPRESSION: Nondisplaced distal fibular fracture with a joint effusion and overlying soft tissue swelling. Electronically Signed   By: Lorin Picket M.D.   On: 04/16/2020 13:04   CT Head Wo Contrast  Result Date: 04/23/2020 CLINICAL DATA:  Un witnessed fall. EXAM: CT HEAD WITHOUT CONTRAST CT MAXILLOFACIAL WITHOUT CONTRAST CT CERVICAL SPINE WITHOUT CONTRAST TECHNIQUE: Multidetector CT imaging of the head, cervical spine, and maxillofacial structures were performed using the standard protocol without intravenous contrast. Multiplanar CT image reconstructions of the cervical spine and maxillofacial structures were also generated. COMPARISON:  CT cervical spine from 12/03/2014. CT head December 13, 2018. FINDINGS: CT HEAD FINDINGS Brain: Small volume of acute subarachnoid hemorrhage along the right frontal convexity (for example see series 4, image 22). Some hemorrhage layering within the anterior aspect of the right sylvian fissure and overlying the anterior right temporal convexity (series 6, image 12; series 4, image 14). No definite acute intraparenchymal hemorrhage. 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 Similar additional patchy white matter hypodensities, most likely related to chronic microvascular ischemic disease. No evidence of acute large vascular territory infarct. No hydrocephalus. No evidence of intraventricular hemorrhage. No mass lesion or abnormal mass effect. Vascular: Calcific atherosclerosis. No hyperdense vessel identified. Skull: No acute fracture. Other: No mastoid effusions. CT MAXILLOFACIAL FINDINGS Osseous: No fracture or mandibular dislocation. No destructive process. Chronic right posterior zygomatic cerclage wire. Orbits: No retro bulbar hematoma. Globes are symmetric and within normal limits. No proptosis. Sinuses: Chronic hypoplastic right  maxillary sinus. The sinuses are largely clear. No air-fluid levels. Soft tissues: Right periorbital/malar soft tissue contusion. CT CERVICAL SPINE FINDINGS Alignment: Similar alignment. Mild levocurvature. No substantial sagittal subluxation. Skull base and vertebrae: Vertebral body heights are maintained. No evidence of acute fracture. Similar degenerative endplate Schmorl's node involving the superior C4 endplate. Soft tissues and spinal canal: No prevertebral fluid or swelling. No visible canal hematoma. Disc levels: Mild to moderate multilevel degenerative disc disease with disc height loss and endplate spurring. No evidence of significant bony canal stenosis. Upper chest: Biapical pleuroparenchymal scarring. Otherwise, visualized lung apices are clear. IMPRESSION: CT head: 1. Small volume of acute right frontotemporal subarachnoid hemorrhage, as detailed above. No mass effect. 2. Remote right subinsular and right frontal infarcts and chronic microvascular ischemic disease. CT maxillofacial 1. No acute fracture. 2. Right periorbital/malar contusion. CT cervical spine: 1. No evidence of acute fracture or traumatic malalignment. 2. Similar large left thyroid lesion, which was biopsied on Jun 18, 2013. Findings discussed with Dr. Tomi Bamberger via telephone at 12:40 PM. Electronically Signed   By: Margaretha Sheffield MD   On: 04/23/2020 12:43   CT Cervical Spine Wo Contrast  Result Date: 04/23/2020 CLINICAL DATA:  Un witnessed fall. EXAM: CT HEAD WITHOUT CONTRAST CT MAXILLOFACIAL WITHOUT CONTRAST CT CERVICAL SPINE WITHOUT CONTRAST TECHNIQUE: Multidetector CT imaging of the head, cervical spine, and maxillofacial structures were performed using the standard protocol without intravenous contrast. Multiplanar CT image reconstructions of the cervical spine and maxillofacial structures were also generated. COMPARISON:  CT cervical spine from 12/03/2014. CT head December 13, 2018. FINDINGS: CT HEAD FINDINGS Brain: Small volume  of acute subarachnoid hemorrhage along the right frontal convexity (for example see series 4, image 22). Some hemorrhage layering within the anterior aspect of the right sylvian fissure and overlying the anterior right temporal convexity (series 6, image 12; series 4, image 14). No definite acute intraparenchymal hemorrhage. 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  Similar additional patchy white matter hypodensities, most likely related to chronic microvascular ischemic disease. No evidence of acute large vascular territory infarct. No hydrocephalus. No evidence of intraventricular hemorrhage. No mass lesion or abnormal mass effect. Vascular: Calcific atherosclerosis. No hyperdense vessel identified. Skull: No acute fracture. Other: No mastoid effusions. CT MAXILLOFACIAL FINDINGS Osseous: No fracture or mandibular dislocation. No destructive process. Chronic right posterior zygomatic cerclage wire. Orbits: No retro bulbar hematoma. Globes are symmetric and within normal limits. No proptosis. Sinuses: Chronic hypoplastic right maxillary sinus. The sinuses are largely clear. No air-fluid levels. Soft tissues: Right periorbital/malar soft tissue contusion. CT CERVICAL SPINE FINDINGS Alignment: Similar alignment. Mild levocurvature. No substantial sagittal subluxation. Skull base and vertebrae: Vertebral body heights are maintained. No evidence of acute fracture. Similar degenerative endplate Schmorl's node involving the superior C4 endplate. Soft tissues and spinal canal: No prevertebral fluid or swelling. No visible canal hematoma. Disc levels: Mild to moderate multilevel degenerative disc disease with disc height loss and endplate spurring. No evidence of significant bony canal stenosis. Upper chest: Biapical pleuroparenchymal scarring. Otherwise, visualized lung apices are clear. IMPRESSION: CT head: 1. Small volume of acute right frontotemporal subarachnoid hemorrhage, as  detailed above. No mass effect. 2. Remote right subinsular and right frontal infarcts and chronic microvascular ischemic disease. CT maxillofacial 1. No acute fracture. 2. Right periorbital/malar contusion. CT cervical spine: 1. No evidence of acute fracture or traumatic malalignment. 2. Similar large left thyroid lesion, which was biopsied on Jun 18, 2013. Findings discussed with Dr. Tomi Bamberger via telephone at 12:40 PM. Electronically Signed   By: Margaretha Sheffield MD   On: 04/23/2020 12:43   DG Chest Portable 1 View  Result Date: 04/23/2020 CLINICAL DATA:  Fall. EXAM: PORTABLE CHEST 1 VIEW COMPARISON:  Chest x-ray dated December 16, 2019. FINDINGS: The heart is at the upper limits of normal in size. Normal pulmonary vascularity. No focal consolidation, pleural effusion, or pneumothorax. No acute osseous abnormality. IMPRESSION: No active disease. Electronically Signed   By: Titus Dubin M.D.   On: 04/23/2020 12:00   DG Shoulder Left  Result Date: 04/23/2020 CLINICAL DATA:  Left shoulder pain after fall. EXAM: LEFT SHOULDER - 2+ VIEW COMPARISON:  Left shoulder x-rays dated Jul 11, 2019. FINDINGS: No acute fracture or dislocation. Unchanged mild acromioclavicular osteoarthritis. Soft tissues are unremarkable. IMPRESSION: 1. No acute osseous abnormality. Electronically Signed   By: Titus Dubin M.D.   On: 04/23/2020 11:57   DG Hip Unilat W or Wo Pelvis 2-3 Views Left  Result Date: 04/23/2020 CLINICAL DATA:  Left hip pain after fall. EXAM: DG HIP (WITH OR WITHOUT PELVIS) 2-3V LEFT COMPARISON:  Left hip x-rays dated April 16, 2020. FINDINGS: No acute fracture or dislocation. Unchanged mild bilateral hip osteoarthritis. Osteopenia. Soft tissues are unremarkable. IMPRESSION: 1. No acute osseous abnormality. Electronically Signed   By: Titus Dubin M.D.   On: 04/23/2020 11:58   DG Hip Unilat With Pelvis 2-3 Views Left  Result Date: 04/16/2020 CLINICAL DATA:  Fall with left hip pain, initial encounter.  EXAM: DG HIP (WITH OR WITHOUT PELVIS) 2-3V LEFT COMPARISON:  None. FINDINGS: No fracture or dislocation. Mild superior joint space narrowing in both hips. IMPRESSION: 1. No fracture or dislocation. 2. Mild superior joint space narrowing in the hips bilaterally. Electronically Signed   By: Lorin Picket M.D.   On: 04/16/2020 13:05   VAS [place] LOWER EXTREMITY VENOUS (DVT)  Result Date: 04/24/2020  Lower Venous DVT Study Indications: Swelling.  Limitations: Body habitus and  poor ultrasound/tissue interface. Comparison Study: No prior studies. Performing Technologist: Oliver Hum RVT  Examination Guidelines: A complete evaluation includes B-mode imaging, spectral Doppler, color Doppler, and power Doppler as needed of all accessible portions of each vessel. Bilateral testing is considered an integral part of a complete examination. Limited examinations for reoccurring indications may be performed as noted. The reflux portion of the exam is performed with the patient in reverse Trendelenburg.  +-----+---------------+---------+-----------+----------+--------------+ RIGHTCompressibilityPhasicitySpontaneityPropertiesThrombus Aging +-----+---------------+---------+-----------+----------+--------------+ CFV  Full           Yes      Yes                                 +-----+---------------+---------+-----------+----------+--------------+   +---------+---------------+---------+-----------+----------+--------------+ LEFT     CompressibilityPhasicitySpontaneityPropertiesThrombus Aging +---------+---------------+---------+-----------+----------+--------------+ CFV      Full           Yes      Yes                                 +---------+---------------+---------+-----------+----------+--------------+ SFJ      Full                                                        +---------+---------------+---------+-----------+----------+--------------+ FV Prox  Full                                                         +---------+---------------+---------+-----------+----------+--------------+ FV Mid   Full                                                        +---------+---------------+---------+-----------+----------+--------------+ FV DistalFull                                                        +---------+---------------+---------+-----------+----------+--------------+ PFV      Full                                                        +---------+---------------+---------+-----------+----------+--------------+ POP      Full           Yes      Yes                                 +---------+---------------+---------+-----------+----------+--------------+ PTV      Full                                                        +---------+---------------+---------+-----------+----------+--------------+  PERO     Full                                                        +---------+---------------+---------+-----------+----------+--------------+     Summary: RIGHT: - No evidence of common femoral vein obstruction.  LEFT: - There is no evidence of deep vein thrombosis in the lower extremity. However, portions of this examination were limited- see technologist comments above.  - No cystic structure found in the popliteal fossa.  *See table(s) above for measurements and observations. Electronically signed by Harold Barban MD on 04/24/2020 at 4:18:57 PM.    Final    CT Maxillofacial Wo Contrast  Result Date: 04/23/2020 CLINICAL DATA:  Un witnessed fall. EXAM: CT HEAD WITHOUT CONTRAST CT MAXILLOFACIAL WITHOUT CONTRAST CT CERVICAL SPINE WITHOUT CONTRAST TECHNIQUE: Multidetector CT imaging of the head, cervical spine, and maxillofacial structures were performed using the standard protocol without intravenous contrast. Multiplanar CT image reconstructions of the cervical spine and maxillofacial structures were also generated. COMPARISON:  CT cervical spine from  12/03/2014. CT head December 13, 2018. FINDINGS: CT HEAD FINDINGS Brain: Small volume of acute subarachnoid hemorrhage along the right frontal convexity (for example see series 4, image 22). Some hemorrhage layering within the anterior aspect of the right sylvian fissure and overlying the anterior right temporal convexity (series 6, image 12; series 4, image 14). No definite acute intraparenchymal hemorrhage. 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 Similar additional patchy white matter hypodensities, most likely related to chronic microvascular ischemic disease. No evidence of acute large vascular territory infarct. No hydrocephalus. No evidence of intraventricular hemorrhage. No mass lesion or abnormal mass effect. Vascular: Calcific atherosclerosis. No hyperdense vessel identified. Skull: No acute fracture. Other: No mastoid effusions. CT MAXILLOFACIAL FINDINGS Osseous: No fracture or mandibular dislocation. No destructive process. Chronic right posterior zygomatic cerclage wire. Orbits: No retro bulbar hematoma. Globes are symmetric and within normal limits. No proptosis. Sinuses: Chronic hypoplastic right maxillary sinus. The sinuses are largely clear. No air-fluid levels. Soft tissues: Right periorbital/malar soft tissue contusion. CT CERVICAL SPINE FINDINGS Alignment: Similar alignment. Mild levocurvature. No substantial sagittal subluxation. Skull base and vertebrae: Vertebral body heights are maintained. No evidence of acute fracture. Similar degenerative endplate Schmorl's node involving the superior C4 endplate. Soft tissues and spinal canal: No prevertebral fluid or swelling. No visible canal hematoma. Disc levels: Mild to moderate multilevel degenerative disc disease with disc height loss and endplate spurring. No evidence of significant bony canal stenosis. Upper chest: Biapical pleuroparenchymal scarring. Otherwise, visualized lung apices are clear. IMPRESSION:  CT head: 1. Small volume of acute right frontotemporal subarachnoid hemorrhage, as detailed above. No mass effect. 2. Remote right subinsular and right frontal infarcts and chronic microvascular ischemic disease. CT maxillofacial 1. No acute fracture. 2. Right periorbital/malar contusion. CT cervical spine: 1. No evidence of acute fracture or traumatic malalignment. 2. Similar large left thyroid lesion, which was biopsied on Jun 18, 2013. Findings discussed with Dr. Tomi Bamberger via telephone at 12:40 PM. Electronically Signed   By: Margaretha Sheffield MD   On: 04/23/2020 12:43    Assessment/Plan:   1. Subarachnoid hemorrhage (HCC) - resolved, mild yellow bruising over right cheek  2. Closed avulsion fracture of distal end of left fibula, sequela - followed by ortho- f.u in 1 month - pain tolerable  with tylenol 1000 mg po bid - allowed to start WBAT - may ambulate with supportive sneaker - home health PT/OT  3. Recurrent falls - suspected due to polypharmacy and comorbidities - xanax and hydrocodone discontinued  4. Vascular dementia without behavioral disturbance ([place]) - continues to need 24 hour care - cont namenda  5. Bipolar affective disorder, depressed in partial remission ([place]) - stable with high dose cymbalta- past weaning attempts unsuccessful  6. Sjogren's syndrome with keratoconjunctivitis sicca (HCC) - stable with biotnen, pilocarpine and magic mouthwash  7. Controlled type 2 diabetes mellitus with diabetic neuropathy, without long-term current use of insulin (HCC) - no hypoglycemic events, sugars 100-200's - cont januvia - cont home blood sugar checks - recommend diet limiting carbs and sugars  8. Irritable bowel syndrome with both constipation and diarrhea - BM today, abdomen soft - stable with linzess and prn miralax  9. PAF (paroxysmal atrial fibrillation) (HCC) - cardizem discontinued in hospital - rate controlled without medications - not on anticoagulant due to  falls  10. Chronic midline low back pain, unspecified whether sciatica present - hydrocodone discontinues in hospital - cont lyrica and cymbalta  11. Gastroesophageal reflux disease without esophagitis - stable with prevacid - cont to avoid food triggers  12. Overactive bladder - stable with Myrbetriq  13. Other polyneuropathy - stable with lyrcia and cymbalta    Patient is being discharged with the following home health services: PT/OT  Patient is being discharged with the following durable medical equipment:None    Patient has been advised to f/u with their PCP in 1-2 weeks to bring them up to date on their rehab stay.  Social services at facility was responsible for arranging this appointment.  Pt was provided with a 30 day supply of prescriptions for medications and refills must be obtained from their PCP.  For controlled substances, a more limited supply may be provided adequate until PCP appointment only.  Future labs/tests needed:  Cbc/diff, bmp

## 2020-05-17 DIAGNOSIS — F341 Dysthymic disorder: Secondary | ICD-10-CM | POA: Diagnosis not present

## 2020-05-17 DIAGNOSIS — E1140 Type 2 diabetes mellitus with diabetic neuropathy, unspecified: Secondary | ICD-10-CM | POA: Diagnosis not present

## 2020-05-17 DIAGNOSIS — M797 Fibromyalgia: Secondary | ICD-10-CM | POA: Diagnosis not present

## 2020-05-17 DIAGNOSIS — F419 Anxiety disorder, unspecified: Secondary | ICD-10-CM | POA: Diagnosis not present

## 2020-05-17 DIAGNOSIS — M19042 Primary osteoarthritis, left hand: Secondary | ICD-10-CM | POA: Diagnosis not present

## 2020-05-17 DIAGNOSIS — M5030 Other cervical disc degeneration, unspecified cervical region: Secondary | ICD-10-CM | POA: Diagnosis not present

## 2020-05-17 DIAGNOSIS — I480 Paroxysmal atrial fibrillation: Secondary | ICD-10-CM | POA: Diagnosis not present

## 2020-05-17 DIAGNOSIS — K219 Gastro-esophageal reflux disease without esophagitis: Secondary | ICD-10-CM | POA: Diagnosis not present

## 2020-05-17 DIAGNOSIS — I10 Essential (primary) hypertension: Secondary | ICD-10-CM | POA: Diagnosis not present

## 2020-05-17 DIAGNOSIS — M3501 Sicca syndrome with keratoconjunctivitis: Secondary | ICD-10-CM | POA: Diagnosis not present

## 2020-05-17 DIAGNOSIS — F0391 Unspecified dementia with behavioral disturbance: Secondary | ICD-10-CM | POA: Diagnosis not present

## 2020-05-17 DIAGNOSIS — S8265XD Nondisplaced fracture of lateral malleolus of left fibula, subsequent encounter for closed fracture with routine healing: Secondary | ICD-10-CM | POA: Diagnosis not present

## 2020-05-17 DIAGNOSIS — F3175 Bipolar disorder, in partial remission, most recent episode depressed: Secondary | ICD-10-CM | POA: Diagnosis not present

## 2020-05-17 DIAGNOSIS — M19041 Primary osteoarthritis, right hand: Secondary | ICD-10-CM | POA: Diagnosis not present

## 2020-05-17 DIAGNOSIS — M160 Bilateral primary osteoarthritis of hip: Secondary | ICD-10-CM | POA: Diagnosis not present

## 2020-05-17 DIAGNOSIS — I058 Other rheumatic mitral valve diseases: Secondary | ICD-10-CM | POA: Diagnosis not present

## 2020-05-20 ENCOUNTER — Ambulatory Visit (INDEPENDENT_AMBULATORY_CARE_PROVIDER_SITE_OTHER): Payer: Medicare Other | Admitting: Family

## 2020-05-20 ENCOUNTER — Other Ambulatory Visit: Payer: Self-pay | Admitting: Rheumatology

## 2020-05-20 ENCOUNTER — Other Ambulatory Visit: Payer: Self-pay

## 2020-05-20 ENCOUNTER — Other Ambulatory Visit: Payer: Self-pay | Admitting: Internal Medicine

## 2020-05-20 ENCOUNTER — Encounter: Payer: Self-pay | Admitting: Family

## 2020-05-20 VITALS — BP 124/80 | HR 75 | Temp 96.2°F | Ht 63.0 in | Wt 145.0 lb

## 2020-05-20 DIAGNOSIS — I609 Nontraumatic subarachnoid hemorrhage, unspecified: Secondary | ICD-10-CM | POA: Diagnosis not present

## 2020-05-20 DIAGNOSIS — K582 Mixed irritable bowel syndrome: Secondary | ICD-10-CM

## 2020-05-20 DIAGNOSIS — R300 Dysuria: Secondary | ICD-10-CM

## 2020-05-20 DIAGNOSIS — I10 Essential (primary) hypertension: Secondary | ICD-10-CM

## 2020-05-20 DIAGNOSIS — M25552 Pain in left hip: Secondary | ICD-10-CM

## 2020-05-20 DIAGNOSIS — R296 Repeated falls: Secondary | ICD-10-CM

## 2020-05-20 DIAGNOSIS — S82832S Other fracture of upper and lower end of left fibula, sequela: Secondary | ICD-10-CM

## 2020-05-20 DIAGNOSIS — M25562 Pain in left knee: Secondary | ICD-10-CM

## 2020-05-20 DIAGNOSIS — M3501 Sicca syndrome with keratoconjunctivitis: Secondary | ICD-10-CM

## 2020-05-20 DIAGNOSIS — G6289 Other specified polyneuropathies: Secondary | ICD-10-CM

## 2020-05-20 LAB — POCT URINALYSIS DIPSTICK
Bilirubin, UA: NEGATIVE
Blood, UA: NEGATIVE
Glucose, UA: NEGATIVE
Ketones, UA: NEGATIVE
Nitrite, UA: NEGATIVE
Protein, UA: NEGATIVE
Spec Grav, UA: 1.025 (ref 1.010–1.025)
Urobilinogen, UA: 0.2 E.U./dL
pH, UA: 6 (ref 5.0–8.0)

## 2020-05-20 NOTE — Patient Instructions (Addendum)
-   please get left hip and left knee X-ray at [facility] at [address] then will call you with results  - Urine specimen send for culture will call you with results   - continue with Physical Therapy

## 2020-05-20 NOTE — Progress Notes (Signed)
Provider: [name] FNP-C   , Nelda Bucks, NP  Patient Care Team: , Nelda Bucks, NP as PCP - General (Family Medicine) Noralee Space, MD as Consulting Physician (Pulmonary Disease) Maisie Fus, MD as Consulting Physician (Obstetrics and Gynecology) Debara Pickett Nadean Corwin, MD as Consulting Physician (Cardiology) Monna Fam, MD as Consulting Physician (Ophthalmology) Kathrynn Ducking, MD as Consulting Physician (Neurology) Franchot Gallo, MD as Consulting Physician (Urology) Lavonna Monarch, MD as Consulting Physician (Dermatology) Gatha Mayer, MD as Consulting Physician (Gastroenterology) Estill Dooms as Social Worker Yvonna Alanis, NP as Nurse Practitioner (Adult Health Nurse Practitioner)  Extended Emergency Contact Information Primary Emergency Contact: Era Skeen Address: [address]          [place], [place] [address] Johnnette Litter of [place] Phone: [phone] Relation: Daughter Secondary Emergency Contact: Arvin Collard [place] of [place] Mobile Phone: [phone] Relation: Other  Code Status:  Full Code  Goals of care: Advanced Directive information Advanced Directives 05/12/2020  Does Patient Have a Medical Advance Directive? No  Type of Advance Directive -  Does patient want to make changes to medical advance directive? No - Patient declined  Copy of [place] in Chart? -  Would patient like information on creating a medical advance directive? -  Pre-existing out of facility DNR order (yellow form or pink MOST form) -     Chief Complaint  Patient presents with  . Acute Visit    Burning when urinating  and odor x 2 weeks. Patient drinks small amount of water daily. Follow-up from facility discharge. Here with caregiver.     HPI:  Pt is a 87 y.o. female seen today for follow up skilled Nursing discharge post hospital admission at [facility] from 04/23/2020 - 04/27/2020  for acute nondisplaced distal fibular fracture left leg  and frontotemporal subarachnoid hemorrhage due to fall at home.Polypharmacy and comorbidity suspected as contributing factors for fall.Her Xanax and Hydrocodone was discontinued. Her Cardizem was also discontinued and blood thinners was held. Has follow up appointment with Orthopedic 06/19/2020 WBAT on left leg pain controlled on Tylenol 1000 mg twice daily. While in rehab she was treated with 7 days course of Keflex for UTI.symptoms resloved.she was discharged from [facility] 05/14/2020.   She is here with care giver who states patient has had multiple fall episodes since discharged home.Has fallen x 4 times today.Care giver states possible UTI.complains of pain with urination but no urgency,frequency,abdominal pain or back pain.Has not had a fever but feels hot.  States while in rehab she was able to walk to the bathroom using her walk but unable to at home due to left knee and left hip pain.she is able to bear some weight on left leg but states pain gets worse.left ankle pain has improved.   CBG readings at home ranging in the 80's - 140's.denies any signs of hypoglycemia.Appetite has been good .   Past Medical History:  Diagnosis Date  . Abnormality of gait   . Adenomatous polyp of colon 2002   61m  . Allergic rhinitis   . Anxiety   . Anxiety and depression   . Chronic back pain   . Dementia without behavioral disturbance ([place]   . Depression   . Diabetes ([place]   . Diverticulosis of colon   . Dry eye syndrome   . Dysphagia   . Dysthymic disorder   . Fall   . Fibromyalgia   . GERD (gastroesophageal reflux disease)   .  H/O hiatal hernia   . History of adverse drug reaction   . History of cerebrovascular disease 09/24/2014  . History of recurrent UTIs   . Hypertension, benign   . Irritable bowel syndrome   . Low back pain syndrome   . Memory loss   . Mitral valve prolapse   . Paroxysmal A-fib ([place])   . Peripheral neuropathy     "both feet and legs"  . Physical deconditioning   . Sjogren's syndrome ([place])   . Therapeutic opioid-induced constipation (OIC)   . Thyroid nodule   . Urinary incontinence    Past Surgical History:  Procedure Laterality Date  . ABDOMINAL HYSTERECTOMY  1967  . APPENDECTOMY    . CARDIAC CATHETERIZATION  02/17/2003   normal L main, LAD free of disease, Cfx free of disease, RCA free of disease (Dr. Loni Muse. Little)  . CATARACT EXTRACTION, BILATERAL    . CHOLECYSTECTOMY  2000  . COLONOSCOPY W/ BIOPSIES     multiple  . DENTAL SURGERY     multiple tooth extractions  . ESOPHAGOGASTRODUODENOSCOPY (EGD) WITH ESOPHAGEAL DILATION N/A 08/23/2012   Procedure: ESOPHAGOGASTRODUODENOSCOPY (EGD) WITH ESOPHAGEAL DILATION;  Surgeon: Milus Banister, MD;  Location: [facility] ENDOSCOPY;  Service: Endoscopy;  Laterality: N/A;  . NASAL SEPTUM SURGERY  1980  . NM MYOCAR PERF WALL MOTION  2003   persantine - normal static and dynamic study w/apical thinning and presvered LV function, no ischemia  . SINUS EXPLORATION     ossifiying fibroma  . TEMPOROMANDIBULAR JOINT SURGERY  1986   Dr. Terence Lux  . TRANSTHORACIC ECHOCARDIOGRAM  2001   mild LVH, normal LV    Allergies  Allergen Reactions  . Banana Nausea And Vomiting  . Codeine Nausea Only    unless given with Phenergan  . Klonopin [Clonazepam] Other (See Comments)    Causes hallucination   . Meperidine Hcl Nausea Only    unless given with Phenergan  . Norflex [Orphenadrine Citrate] Nausea Only    Unless given with Phenergan  . Oxycodone-Acetaminophen Nausea Only    unless given with phenergan  . Propoxyphene Hcl Nausea Only    unless given with phenergan  . Zoloft [Sertraline Hcl] Other (See Comments)    Caused lethargy  . Doxycycline Other (See Comments)    Unknown reaction  . Naproxen Other (See Comments)    Unknown reaction  . Penicillins Other (See Comments)    Unknown reaction Has patient had a PCN reaction causing immediate rash,  facial/tongue/throat swelling, SOB or lightheadedness with hypotension: Unknown Has patient had a PCN reaction causing severe rash involving mucus membranes or skin necrosis: Unknown Has patient had a PCN reaction that required hospitalization: pt was in the hospital at time of reaction Has patient had a PCN reaction occurring within the last 10 years: Unknown If all of the above answers are "NO", then may proceed with Cephalos  . Phenothiazines Other (See Comments)    Unknown reaction  . Stelazine Other (See Comments)    Unknown reaction  . Sulfamethoxazole-Trimethoprim Other (See Comments)    Unknown reaction  . Tolectin [Tolmetin Sodium] Other (See Comments)    Unknown reaction  . Tramadol Other (See Comments)    Unknown reaction    Allergies as of 05/20/2020      Reactions   Banana Nausea And Vomiting   Codeine Nausea Only   unless given with Phenergan   Klonopin [clonazepam] Other (See Comments)   Causes hallucination   Meperidine Hcl Nausea Only   unless  given with Phenergan   Norflex [orphenadrine Citrate] Nausea Only   Unless given with Phenergan   Oxycodone-acetaminophen Nausea Only   unless given with phenergan   Propoxyphene Hcl Nausea Only   unless given with phenergan   Zoloft [sertraline Hcl] Other (See Comments)   Caused lethargy   Doxycycline Other (See Comments)   Unknown reaction   Naproxen Other (See Comments)   Unknown reaction   Penicillins Other (See Comments)   Unknown reaction Has patient had a PCN reaction causing immediate rash, facial/tongue/throat swelling, SOB or lightheadedness with hypotension: Unknown Has patient had a PCN reaction causing severe rash involving mucus membranes or skin necrosis: Unknown Has patient had a PCN reaction that required hospitalization: pt was in the hospital at time of reaction Has patient had a PCN reaction occurring within the last 10 years: Unknown If all of the above answers are "NO", then may proceed with  Cephalos   Phenothiazines Other (See Comments)   Unknown reaction   Stelazine Other (See Comments)   Unknown reaction   Sulfamethoxazole-trimethoprim Other (See Comments)   Unknown reaction   Tolectin [tolmetin Sodium] Other (See Comments)   Unknown reaction   Tramadol Other (See Comments)   Unknown reaction      Medication List       Accurate as of May 20, 2020  4:19 PM. If you have any questions, ask your nurse or doctor.        STOP taking these medications   FIRST-DUKES MOUTHWASH MT Stopped by: Sandrea Hughs, NP     TAKE these medications   acetaminophen 500 MG tablet Commonly known as: TYLENOL Take 1,000 mg by mouth 2 (two) times daily.   antiseptic oral rinse Liqd 15 mLs by Mouth Rinse route as needed.   capsaicin-methyl sal-menthol 0.025-1-12 % Crea Generic drug: Capsaicin-Menthol-Methyl Sal Apply 1 application topically 2 (two) times daily.   carboxymethylcellulose 0.5 % Soln Commonly known as: REFRESH PLUS Place 1 drop into both eyes in the morning, at noon, in the evening, and at bedtime.   CRANBERRY PO Take 2 tablets by mouth daily. AZO   DULoxetine 30 MG capsule Commonly known as: CYMBALTA Take 1 capsule (30 mg total) by mouth daily. Take along with 60 mg to = 90 mg for back pain and depression   DULoxetine 60 MG capsule Commonly known as: CYMBALTA Take 1 capsule (60 mg total) by mouth daily. Take along with 30 mg to = 90 mg for back pain and Depression   furosemide 40 MG tablet Commonly known as: LASIX Take 1 tablet (40 mg total) by mouth daily.   Lancets Micro Thin 33G Misc Use to test blood sugar daily. Dx: E08.65   lansoprazole 30 MG capsule Commonly known as: PREVACID TAKE 1 CAPSULE BY MOUTH ONCE DAILY AT NOON   linaclotide 290 MCG Caps capsule Commonly known as: Linzess TAKE 1 CAPSULE BY MOUTH EVERY DAY AT BEDTIME   loratadine 10 MG tablet Commonly known as: CLARITIN Take 1 tablet (10 mg total) by mouth daily.   memantine 10  MG tablet Commonly known as: NAMENDA Take 1 tablet (10 mg total) by mouth 2 (two) times daily.   mirabegron ER 50 MG Tb24 tablet Commonly known as: Myrbetriq Take 1 tablet (50 mg total) by mouth daily.   OneTouch Verio test strip Generic drug: glucose blood Use to test blood sugar daily. Dx:E08.65   pilocarpine 5 MG tablet Commonly known as: SALAGEN Take 1 tablet (5 mg total) by mouth  2 (two) times daily.   polyethylene glycol 17 g packet Commonly known as: MIRALAX / GLYCOLAX Take 17 g by mouth daily as needed for mild constipation (constipation). Mix with 8 oz. water or juice   potassium chloride SA 20 MEQ tablet Commonly known as: KLOR-CON Take 1 tablet (20 mEq total) by mouth daily.   pregabalin 100 MG capsule Commonly known as: LYRICA Take 1 capsule (100 mg total) by mouth 2 (two) times daily.   senna-docusate 8.6-50 MG tablet Commonly known as: Senna S Take 1 tablet by mouth daily.   sitaGLIPtin 100 MG tablet Commonly known as: Januvia Take 1 tablet (100 mg total) by mouth daily.       Review of Systems  Constitutional: Negative for appetite change, chills, fatigue and fever.  Eyes: Negative for pain, discharge, redness and itching.  Respiratory: Negative for cough, chest tightness, shortness of breath and wheezing.   Cardiovascular: Negative for chest pain, palpitations and leg swelling.  Gastrointestinal: Negative for abdominal distention, abdominal pain, constipation, diarrhea, nausea and vomiting.  Endocrine: Negative for cold intolerance, heat intolerance, polydipsia, polyphagia and polyuria.  Genitourinary: Positive for dysuria and frequency. Negative for difficulty urinating, flank pain and urgency.  Musculoskeletal: Positive for arthralgias and gait problem. Negative for myalgias.  Skin: Negative for color change, pallor and rash.  Neurological: Negative for dizziness, speech difficulty, weakness, light-headedness, numbness and headaches.  Hematological:  Does not bruise/bleed easily.  Psychiatric/Behavioral: Negative for agitation, behavioral problems and sleep disturbance. The patient is not nervous/anxious.     Immunization History  Administered Date(s) Administered  . Fluad Quad(high Dose 65+) 11/08/2018, 11/24/2019  . Influenza Split 12/26/2010, 01/15/2012  . Influenza Whole 12/30/2008, 11/15/2009  . Influenza, High Dose Seasonal PF 12/21/2016, 11/05/2017  . Influenza,inj,Quad PF,6+ Mos 02/04/2013, 11/03/2013, 12/05/2014, 12/13/2015  . Moderna Sars-Covid-2 Vaccination 04/17/2019, 05/15/2019, 12/15/2019  . Pneumococcal Conjugate-13 06/12/2014  . Pneumococcal Polysaccharide-23 02/15/2009, 12/05/2014  . Tdap 12/08/2013  . Zoster 01/29/2012   Pertinent  Health Maintenance Due  Topic Date Due  . OPHTHALMOLOGY EXAM  Never done  . FOOT EXAM  01/28/2020  . INFLUENZA VACCINE  09/13/2020  . HEMOGLOBIN A1C  09/29/2020  . DEXA SCAN  Completed  . PNA vac Low Risk Adult  Completed  . URINE MICROALBUMIN  Discontinued   Fall Risk  04/01/2020 03/16/2020 12/29/2019 12/09/2019 12/09/2019  Falls in the past year? _0 - 1  Number falls in past yr: _1 - 0  Injury with Fall? 0 0 _2 Comment - - rib fracture sprined hand, shoulder hurt sprained ankle  Risk Factor Category  - - - - -  Risk for fall due to : - - History of fall(s) - -  Follow up - - - - -   Functional Status Survey:    Vitals:   05/20/20 1511  BP: 124/80  Pulse: 75  Temp: (!) 96.2 F (35.7 C)  TempSrc: Temporal  SpO2: 98%  Weight: 145 lb (65.8 kg)  Height: 5' 3" (1.6 m)   Body mass index is 25.69 kg/m. Physical Exam Vitals reviewed.  Constitutional:      General: She is not in acute distress.    Appearance: She is overweight. She is not ill-appearing.  HENT:     Head: Normocephalic.     Right Ear: Tympanic membrane, ear canal and external ear normal. There is no impacted cerumen.     Left Ear: Tympanic membrane, ear canal and external ear normal. There  is  no impacted cerumen.     Nose: Nose normal. No congestion or rhinorrhea.     Mouth/Throat:     Mouth: Mucous membranes are moist.     Pharynx: Oropharynx is clear. No oropharyngeal exudate or posterior oropharyngeal erythema.  Eyes:     General: No scleral icterus.       Right eye: No discharge.        Left eye: No discharge.     Extraocular Movements: Extraocular movements intact.     Conjunctiva/sclera: Conjunctivae normal.     Pupils: Pupils are equal, round, and reactive to light.  Neck:     Vascular: No carotid bruit.  Cardiovascular:     Rate and Rhythm: Normal rate and regular rhythm.     Pulses: Normal pulses.     Heart sounds: Normal heart sounds. No murmur heard. No friction rub. No gallop.   Pulmonary:     Effort: Pulmonary effort is normal. No respiratory distress.     Breath sounds: Normal breath sounds. No wheezing, rhonchi or rales.  Chest:     Chest wall: No tenderness.  Abdominal:     General: Bowel sounds are normal. There is no distension.     Palpations: Abdomen is soft. There is no mass.     Tenderness: There is abdominal tenderness in the suprapubic area. There is no right CVA tenderness, left CVA tenderness, guarding or rebound.  Musculoskeletal:        General: No swelling.     Cervical back: Normal range of motion. No rigidity or tenderness.     Right hip: Normal.     Left hip: Tenderness present. No crepitus. Normal range of motion. Normal strength.     Right knee: Normal.     Left knee: No swelling, effusion, erythema or crepitus. Normal range of motion. No tenderness.     Right lower leg: No edema.     Comments: Unsteady gait  Trace edema on left leg   Lymphadenopathy:     Cervical: No cervical adenopathy.  Skin:    General: Skin is warm and dry.     Coloration: Skin is not pale.     Findings: No bruising, erythema or rash.  Neurological:     Mental Status: She is alert. Mental status is at baseline.     Cranial Nerves: No cranial nerve  deficit.     Sensory: No sensory deficit.     Motor: No weakness.     Coordination: Coordination normal.     Gait: Gait abnormal.  Psychiatric:        Mood and Affect: Mood normal.        Speech: Speech normal.        Behavior: Behavior normal.        Thought Content: Thought content normal.     Labs reviewed: Recent Labs    04/23/20 1051 04/24/20 0547 04/25/20 0628  NA 139 134* 136  K 4.0 4.0 3.8  CL 101 98 102  CO2 _0 GLUCOSE 137* 147* 161*  BUN _1 CREATININE 0.56 0.85 0.63  CALCIUM 9.8 9.3 9.6   No results for input(s): AST, ALT, ALKPHOS, BILITOT, PROT, ALBUMIN in the last 8760 hours. Recent Labs    04/01/20 1614 04/23/20 1051 04/24/20 0547 04/25/20 0628  WBC 8.8 6.5 7.5 7.0  NEUTROABS 5,878  --   --   --   HGB 13.3 12.2 13.1 13.5  HCT 39.9 37.4 40.0 41.0  MCV 93.7 96.4 94.8 94.0  PLT 242 218 241 246   Lab Results  Component Value Date   TSH 1.35 09/04/2017   Lab Results  Component Value Date   HGBA1C 6.8 (H) 04/01/2020   Lab Results  Component Value Date   CHOL 183 09/06/2017   HDL 63 09/06/2017   LDLCALC 99 09/06/2017   LDLDIRECT 65.7 07/08/2010   TRIG 113 09/06/2017   CHOLHDL 2.9 09/06/2017    Significant Diagnostic Results in last 30 days:  DG Ankle Complete Left  Result Date: 04/23/2020 CLINICAL DATA:  Left ankle pain after fall. EXAM: LEFT ANKLE COMPLETE - 3+ VIEW COMPARISON:  Left ankle x-rays dated April 16, 2020. FINDINGS: Acute nondisplaced fracture of the distal fibula again noted. No new fracture. No dislocation. The ankle mortise is symmetric. The talar dome is intact. Joint spaces are preserved. Osteopenia. Decreasing soft tissue swelling over the lateral malleolus. IMPRESSION: 1. Unchanged acute nondisplaced distal fibular fracture. No new fracture. Electronically Signed   By: Titus Dubin M.D.   On: 04/23/2020 11:56   CT Head Wo Contrast  Result Date: 04/23/2020 CLINICAL DATA:  Un witnessed fall. EXAM: CT HEAD  WITHOUT CONTRAST CT MAXILLOFACIAL WITHOUT CONTRAST CT CERVICAL SPINE WITHOUT CONTRAST TECHNIQUE: Multidetector CT imaging of the head, cervical spine, and maxillofacial structures were performed using the standard protocol without intravenous contrast. Multiplanar CT image reconstructions of the cervical spine and maxillofacial structures were also generated. COMPARISON:  CT cervical spine from 12/03/2014. CT head December 13, 2018. FINDINGS: CT HEAD FINDINGS Brain: Small volume of acute subarachnoid hemorrhage along the right frontal convexity (for example see series 4, image 22). Some hemorrhage layering within the anterior aspect of the right sylvian fissure and overlying the anterior right temporal convexity (series 6, image 12; series 4, image 14). No definite acute intraparenchymal hemorrhage. 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 Similar additional patchy white matter hypodensities, most likely related to chronic microvascular ischemic disease. No evidence of acute large vascular territory infarct. No hydrocephalus. No evidence of intraventricular hemorrhage. No mass lesion or abnormal mass effect. Vascular: Calcific atherosclerosis. No hyperdense vessel identified. Skull: No acute fracture. Other: No mastoid effusions. CT MAXILLOFACIAL FINDINGS Osseous: No fracture or mandibular dislocation. No destructive process. Chronic right posterior zygomatic cerclage wire. Orbits: No retro bulbar hematoma. Globes are symmetric and within normal limits. No proptosis. Sinuses: Chronic hypoplastic right maxillary sinus. The sinuses are largely clear. No air-fluid levels. Soft tissues: Right periorbital/malar soft tissue contusion. CT CERVICAL SPINE FINDINGS Alignment: Similar alignment. Mild levocurvature. No substantial sagittal subluxation. Skull base and vertebrae: Vertebral body heights are maintained. No evidence of acute fracture. Similar degenerative endplate Schmorl's  node involving the superior C4 endplate. Soft tissues and spinal canal: No prevertebral fluid or swelling. No visible canal hematoma. Disc levels: Mild to moderate multilevel degenerative disc disease with disc height loss and endplate spurring. No evidence of significant bony canal stenosis. Upper chest: Biapical pleuroparenchymal scarring. Otherwise, visualized lung apices are clear. IMPRESSION: CT head: 1. Small volume of acute right frontotemporal subarachnoid hemorrhage, as detailed above. No mass effect. 2. Remote right subinsular and right frontal infarcts and chronic microvascular ischemic disease. CT maxillofacial 1. No acute fracture. 2. Right periorbital/malar contusion. CT cervical spine: 1. No evidence of acute fracture or traumatic malalignment. 2. Similar large left thyroid lesion, which was biopsied on Jun 18, 2013. Findings discussed with Dr. Tomi Bamberger via telephone at 12:40 PM. Electronically Signed   By: Margaretha Sheffield MD  On: 04/23/2020 12:43   CT Cervical Spine Wo Contrast  Result Date: 04/23/2020 CLINICAL DATA:  Un witnessed fall. EXAM: CT HEAD WITHOUT CONTRAST CT MAXILLOFACIAL WITHOUT CONTRAST CT CERVICAL SPINE WITHOUT CONTRAST TECHNIQUE: Multidetector CT imaging of the head, cervical spine, and maxillofacial structures were performed using the standard protocol without intravenous contrast. Multiplanar CT image reconstructions of the cervical spine and maxillofacial structures were also generated. COMPARISON:  CT cervical spine from 12/03/2014. CT head December 13, 2018. FINDINGS: CT HEAD FINDINGS Brain: Small volume of acute subarachnoid hemorrhage along the right frontal convexity (for example see series 4, image 22). Some hemorrhage layering within the anterior aspect of the right sylvian fissure and overlying the anterior right temporal convexity (series 6, image 12; series 4, image 14). No definite acute intraparenchymal hemorrhage. Similar prior right subinsular and right parietal  infarcts with encephalomalacia. Similar additional patchy white matter hypodensities, most likely related to chronic microvascular ischemic disease. No evidence of acute large vascular territory infarct. No hydrocephalus. No evidence of intraventricular hemorrhage. No mass lesion or abnormal mass effect. Vascular: Calcific atherosclerosis. No hyperdense vessel identified. Skull: No acute fracture. Other: No mastoid effusions. CT MAXILLOFACIAL FINDINGS Osseous: No fracture or mandibular dislocation. No destructive process. Chronic right posterior zygomatic cerclage wire. Orbits: No retro bulbar hematoma. Globes are symmetric and within normal limits. No proptosis. Sinuses: Chronic hypoplastic right maxillary sinus. The sinuses are largely clear. No air-fluid levels. Soft tissues: Right periorbital/malar soft tissue contusion. CT CERVICAL SPINE FINDINGS Alignment: Similar alignment. Mild levocurvature. No substantial sagittal subluxation. Skull base and vertebrae: Vertebral body heights are maintained. No evidence of acute fracture. Similar degenerative endplate Schmorl's node involving the superior C4 endplate. Soft tissues and spinal canal: No prevertebral fluid or swelling. No visible canal hematoma. Disc levels: Mild to moderate multilevel degenerative disc disease with disc height loss and endplate spurring. No evidence of significant bony canal stenosis. Upper chest: Biapical pleuroparenchymal scarring. Otherwise, visualized lung apices are clear. IMPRESSION: CT head: 1. Small volume of acute right frontotemporal subarachnoid hemorrhage, as detailed above. No mass effect. 2. Remote right subinsular and right frontal infarcts and chronic microvascular ischemic disease. CT maxillofacial 1. No acute fracture. 2. Right periorbital/malar contusion. CT cervical spine: 1. No evidence of acute fracture or traumatic malalignment. 2. Similar large left thyroid lesion, which was biopsied on Jun 18, 2013. Findings discussed  with Dr. Tomi Bamberger via telephone at 12:40 PM. Electronically Signed   By: Margaretha Sheffield MD   On: 04/23/2020 12:43   DG Chest Portable 1 View  Result Date: 04/23/2020 CLINICAL DATA:  Fall. EXAM: PORTABLE CHEST 1 VIEW COMPARISON:  Chest x-ray dated December 16, 2019. FINDINGS: The heart is at the upper limits of normal in size. Normal pulmonary vascularity. No focal consolidation, pleural effusion, or pneumothorax. No acute osseous abnormality. IMPRESSION: No active disease. Electronically Signed   By: Titus Dubin M.D.   On: 04/23/2020 12:00   DG Shoulder Left  Result Date: 04/23/2020 CLINICAL DATA:  Left shoulder pain after fall. EXAM: LEFT SHOULDER - 2+ VIEW COMPARISON:  Left shoulder x-rays dated Jul 11, 2019. FINDINGS: No acute fracture or dislocation. Unchanged mild acromioclavicular osteoarthritis. Soft tissues are unremarkable. IMPRESSION: 1. No acute osseous abnormality. Electronically Signed   By: Titus Dubin M.D.   On: 04/23/2020 11:57   DG Hip Unilat W or Wo Pelvis 2-3 Views Left  Result Date: 04/23/2020 CLINICAL DATA:  Left hip pain after fall. EXAM: DG HIP (WITH OR WITHOUT PELVIS) 2-3V LEFT COMPARISON:  Left hip x-rays dated April 16, 2020. FINDINGS: No acute fracture or dislocation. Unchanged mild bilateral hip osteoarthritis. Osteopenia. Soft tissues are unremarkable. IMPRESSION: 1. No acute osseous abnormality. Electronically Signed   By: Titus Dubin M.D.   On: 04/23/2020 11:58   VAS [place] LOWER EXTREMITY VENOUS (DVT)  Result Date: 04/24/2020  Lower Venous DVT Study Indications: Swelling.  Limitations: Body habitus and poor ultrasound/tissue interface. Comparison Study: No prior studies. Performing Technologist: Oliver Hum RVT  Examination Guidelines: A complete evaluation includes B-mode imaging, spectral Doppler, color Doppler, and power Doppler as needed of all accessible portions of each vessel. Bilateral testing is considered an integral part of a complete examination.  Limited examinations for reoccurring indications may be performed as noted. The reflux portion of the exam is performed with the patient in reverse Trendelenburg.  +-----+---------------+---------+-----------+----------+--------------+ RIGHTCompressibilityPhasicitySpontaneityPropertiesThrombus Aging +-----+---------------+---------+-----------+----------+--------------+ CFV  Full           Yes      Yes                                 +-----+---------------+---------+-----------+----------+--------------+   +---------+---------------+---------+-----------+----------+--------------+ LEFT     CompressibilityPhasicitySpontaneityPropertiesThrombus Aging +---------+---------------+---------+-----------+----------+--------------+ CFV      Full           Yes      Yes                                 +---------+---------------+---------+-----------+----------+--------------+ SFJ      Full                                                        +---------+---------------+---------+-----------+----------+--------------+ FV Prox  Full                                                        +---------+---------------+---------+-----------+----------+--------------+ FV Mid   Full                                                        +---------+---------------+---------+-----------+----------+--------------+ FV DistalFull                                                        +---------+---------------+---------+-----------+----------+--------------+ PFV      Full                                                        +---------+---------------+---------+-----------+----------+--------------+ POP      Full           Yes      Yes                                 +---------+---------------+---------+-----------+----------+--------------+  PTV      Full                                                         +---------+---------------+---------+-----------+----------+--------------+ PERO     Full                                                        +---------+---------------+---------+-----------+----------+--------------+     Summary: RIGHT: - No evidence of common femoral vein obstruction.  LEFT: - There is no evidence of deep vein thrombosis in the lower extremity. However, portions of this examination were limited- see technologist comments above.  - No cystic structure found in the popliteal fossa.  *See table(s) above for measurements and observations. Electronically signed by Harold Barban MD on 04/24/2020 at 4:18:57 PM.    Final    CT Maxillofacial Wo Contrast  Result Date: 04/23/2020 CLINICAL DATA:  Un witnessed fall. EXAM: CT HEAD WITHOUT CONTRAST CT MAXILLOFACIAL WITHOUT CONTRAST CT CERVICAL SPINE WITHOUT CONTRAST TECHNIQUE: Multidetector CT imaging of the head, cervical spine, and maxillofacial structures were performed using the standard protocol without intravenous contrast. Multiplanar CT image reconstructions of the cervical spine and maxillofacial structures were also generated. COMPARISON:  CT cervical spine from 12/03/2014. CT head December 13, 2018. FINDINGS: CT HEAD FINDINGS Brain: Small volume of acute subarachnoid hemorrhage along the right frontal convexity (for example see series 4, image 22). Some hemorrhage layering within the anterior aspect of the right sylvian fissure and overlying the anterior right temporal convexity (series 6, image 12; series 4, image 14). No definite acute intraparenchymal hemorrhage. 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 Similar additional patchy white matter hypodensities, most likely related to chronic microvascular ischemic disease. No evidence of acute large vascular territory infarct. No hydrocephalus. No evidence of intraventricular hemorrhage. No mass lesion or abnormal mass effect. Vascular: Calcific  atherosclerosis. No hyperdense vessel identified. Skull: No acute fracture. Other: No mastoid effusions. CT MAXILLOFACIAL FINDINGS Osseous: No fracture or mandibular dislocation. No destructive process. Chronic right posterior zygomatic cerclage wire. Orbits: No retro bulbar hematoma. Globes are symmetric and within normal limits. No proptosis. Sinuses: Chronic hypoplastic right maxillary sinus. The sinuses are largely clear. No air-fluid levels. Soft tissues: Right periorbital/malar soft tissue contusion. CT CERVICAL SPINE FINDINGS Alignment: Similar alignment. Mild levocurvature. No substantial sagittal subluxation. Skull base and vertebrae: Vertebral body heights are maintained. No evidence of acute fracture. Similar degenerative endplate Schmorl's node involving the superior C4 endplate. Soft tissues and spinal canal: No prevertebral fluid or swelling. No visible canal hematoma. Disc levels: Mild to moderate multilevel degenerative disc disease with disc height loss and endplate spurring. No evidence of significant bony canal stenosis. Upper chest: Biapical pleuroparenchymal scarring. Otherwise, visualized lung apices are clear. IMPRESSION: CT head: 1. Small volume of acute right frontotemporal subarachnoid hemorrhage, as detailed above. No mass effect. 2. Remote right subinsular and right frontal infarcts and chronic microvascular ischemic disease. CT maxillofacial 1. No acute fracture. 2. Right periorbital/malar contusion. CT cervical spine: 1. No evidence of acute fracture or traumatic malalignment. 2. Similar large left thyroid lesion, which was biopsied on Jun 18, 2013. Findings discussed  with Dr. Tomi Bamberger via telephone at 12:40 PM. Electronically Signed   By: Margaretha Sheffield MD   On: 04/23/2020 12:43    Assessment/Plan 1. Dysuria Afebrile.suprapubic slight tenderness on exam noted. - POC Urinalysis Dipstick indicates dark yellow urine with moderate 2+ Leukocytes. - Encouraged to increase water intake  to 6-8 glasses daily  - will send urine for culture made aware will call in 3 days with results but notify provider if running any fever,chills or worsening symptoms.  - Culture, Urine  2. Subarachnoid hemorrhage (HCC) Bruise on cheek resolved.  - CBC with Differential/Platelet  3. Closed avulsion fracture of distal end of left fibula, sequela WBAT  - continue with Physical therapy  - CBC with Differential/Platelet  4. Recurrent falls Has had multiple falls since discharge from SNF  Urine specimen send for culture today. - CBC with Differential/Platelet - BMP with eGFR([facility])  5. Left hip pain New onset of pain since discharged from SNF will obtain imaging due to multiple fall episode  - continue on Tylenol  - DG Hip Unilat W OR W/O Pelvis 2-3 Views Left; Future  6. Acute pain of left knee Will obtain imaging due to multiple falls  - continue on Tylenol  - DG Knee Complete 4 Views Left; Future  Family/ staff Communication: Reviewed plan of care with patient and Care giver verbalized understanding.   Labs/tests ordered:  - CBC with Differential/Platelet - BMP with eGFR([facility])  - DG Knee Complete 4 Views Left; Future - DG Hip Unilat W OR W/O Pelvis 2-3 Views Left; Future - Culture, Urine   Next Appointment : 6 months for medical management of chronic issues.Fasting Labs prior to visit.    Sandrea Hughs, NP

## 2020-05-21 ENCOUNTER — Ambulatory Visit
Admission: RE | Admit: 2020-05-21 | Discharge: 2020-05-21 | Disposition: A | Discharge status: Home / Self Care | Payer: Medicare Other | Source: Ambulatory Visit | Attending: Family | Admitting: Family

## 2020-05-21 DIAGNOSIS — M25552 Pain in left hip: Secondary | ICD-10-CM

## 2020-05-21 DIAGNOSIS — M25562 Pain in left knee: Secondary | ICD-10-CM

## 2020-05-21 LAB — BASIC METABOLIC PANEL WITH GFR
BUN/Creatinine Ratio: 17 (calc) (ref 6–22)
BUN: 16 mg/dL (ref 7–25)
CO2: 23 mmol/L (ref 20–32)
Calcium: 9.7 mg/dL (ref 8.6–10.4)
Chloride: 103 mmol/L (ref 98–110)
Creat: 0.94 mg/dL — ABNORMAL HIGH (ref 0.60–0.88)
GFR, Est African American: 63 mL/min/{1.73_m2} (ref >=60)
GFR, Est Non African American: 54 mL/min/{1.73_m2} — ABNORMAL LOW (ref >=60)
Glucose, Bld: 93 mg/dL (ref 65–99)
Potassium: 4.3 mmol/L (ref 3.5–5.3)
Sodium: 138 mmol/L (ref 135–146)

## 2020-05-21 LAB — CBC WITH DIFFERENTIAL/PLATELET
Absolute Monocytes: 632 cells/uL (ref 200–950)
Basophils Absolute: 28 cells/uL (ref 0–200)
Basophils Relative: 0.4 %
Eosinophils Absolute: 227 cells/uL (ref 15–500)
Eosinophils Relative: 3.2 %
HCT: 39.2 % (ref 35.0–45.0)
Hemoglobin: 12.8 g/dL (ref 11.7–15.5)
Lymphs Abs: 1725 cells/uL (ref 850–3900)
MCH: 30.8 pg (ref 27.0–33.0)
MCHC: 32.7 g/dL (ref 32.0–36.0)
MCV: 94.5 fL (ref 80.0–100.0)
MPV: 11.6 fL (ref 7.5–12.5)
Monocytes Relative: 8.9 %
Neutro Abs: 4487 cells/uL (ref 1500–7800)
Neutrophils Relative %: 63.2 %
Platelets: 206 10*3/uL (ref 140–400)
RBC: 4.15 10*6/uL (ref 3.80–5.10)
RDW: 12 % (ref 11.0–15.0)
Total Lymphocyte: 24.3 %
WBC: 7.1 10*3/uL (ref 3.8–10.8)

## 2020-05-22 LAB — URINE CULTURE
MICRO NUMBER:: 11743487
SPECIMEN QUALITY:: ADEQUATE

## 2020-05-26 ENCOUNTER — Telehealth: Payer: Self-pay

## 2020-05-26 MED ORDER — NITROFURANTOIN MONOHYD MACRO 100 MG PO CAPS: 100.0000 mg | ORAL_CAPSULE | Freq: Two times a day (BID) | ORAL | 0 refills | Status: AC

## 2020-05-26 NOTE — Telephone Encounter (Signed)
-----   Message from Sandrea Hughs, NP sent at 05/25/2020  5:00 PM EDT ----- Labs are normal except urine specimen showed > 100,000 colonies of E.Coli bacteria.Start on Nitrofurantoin 100 mg capsule one by mouth twice daily x 7 days   Take along with a Probiotic florastor 250 mg capsule one by mouth twice daily x 10 days to prevent antibiotics associated diarrhea.

## 2020-05-26 NOTE — Telephone Encounter (Signed)
Discussed results with patients caregiver Pricilla Larsson verbalized understanding of results  Per [name] patient is already on a probiotic daily  RX for Nitrofurantoin sent to pharmacy on file (confirmed with caregiver)

## 2020-06-01 ENCOUNTER — Encounter: Payer: Self-pay | Admitting: Orthopedic Surgery

## 2020-06-01 ENCOUNTER — Ambulatory Visit (INDEPENDENT_AMBULATORY_CARE_PROVIDER_SITE_OTHER): Payer: Medicare Other | Admitting: Physician Assistant

## 2020-06-01 DIAGNOSIS — M25572 Pain in left ankle and joints of left foot: Secondary | ICD-10-CM

## 2020-06-01 NOTE — Progress Notes (Signed)
Office Visit Note   Patient: Ariana [name]           Date of Birth: Sep 01, 1929           MRN: [id number] Visit Date: 06/01/2020              Requested by: Gayland Curry, DO [place],  [place] [address] PCP: Sandrea Hughs, NP  Chief Complaint  Patient presents with  . Left Ankle - Follow-up    Distal fib fx s/p fall 04/16/20      HPI: Patient is a 87 year old woman who is now 7 weeks status post left distal fibula fracture treated conservatively.  She was in a nursing facility but is now at home with her caregiver who reports that she is actually doing quite well using a walker or cane which is her baseline The patient does complain of some radicular symptoms in her buttock and her knee per her caregiver these have been evaluated and she does have arthritis in her spine Assessment & Plan: Visit Diagnoses: No diagnosis found.  Plan: May follow-up as needed.  Follow-Up Instructions: No follow-ups on file.   Ortho Exam  Patient is alert, oriented, no adenopathy, well-dressed, normal affect, normal respiratory effort. No soft tissue swelling in her left ankle she has mild tenderness to deep palpation over the distal fibula but good ankle range of motion with some slight just stiffness with dorsiflexion compartments are soft and nontender good plantar flexion strength.  No cellulitis  Imaging: No results found. No images are attached to the encounter.  Labs: Lab Results  Component Value Date   HGBA1C 6.8 (H) 04/01/2020   HGBA1C 6.0 (H) 03/07/2018   HGBA1C 6.4 (H) 09/04/2017   ESRSEDRATE 5 08/23/2012   ESRSEDRATE 16 07/18/2011   ESRSEDRATE 11 07/08/2010   REPTSTATUS 04/29/2020 FINAL 04/26/2020   CULT >=100,000 COLONIES/mL ESCHERICHIA COLI (A) 04/26/2020   LABORGA ESCHERICHIA COLI (A) 04/26/2020     Lab Results  Component Value Date   ALBUMIN 4.1 05/20/2019   ALBUMIN 4.5 03/04/2019   ALBUMIN 4.4 12/04/2018    Lab Results  Component Value Date   MG  2.0 04/24/2017   MG 2.0 04/23/2017   MG 2.0 06/29/2015   Lab Results  Component Value Date   VD25OH 52 05/16/2012    No results found for: PREALBUMIN CBC EXTENDED Latest Ref Rng & Units 05/20/2020 04/25/2020 04/24/2020  WBC 3.8 - 10.8 Thousand/uL 7.1 7.0 7.5  RBC 3.80 - 5.10 Million/uL 4.15 4.36 4.22  HGB 11.7 - 15.5 g/dL 12.8 13.5 13.1  HCT 35.0 - 45.0 % 39.2 41.0 40.0  PLT 140 - 400 Thousand/uL 206 246 241  NEUTROABS 1,500 - 7,800 cells/uL 4,487 - -  LYMPHSABS 850 - 3,900 cells/uL 1,725 - -     There is no height or weight on file to calculate BMI.  Orders:  No orders of the defined types were placed in this encounter.  No orders of the defined types were placed in this encounter.    Procedures: No procedures performed  Clinical Data: No additional findings.  ROS:  All other systems negative, except as noted in the HPI. Review of Systems  Objective: Vital Signs: There were no vitals taken for this visit.  Specialty Comments:  No specialty comments available.  PMFS History: Patient Active Problem List   Diagnosis Date Noted  . Subarachnoid hemorrhage ([place]) 04/23/2020  . Diabetic neuropathy ([place]) 01/28/2019  . Diabetes mellitus due to  underlying condition, uncontrolled ([place]) 09/04/2017  . History of rectal bleeding 09/04/2017  . A-fib ([place]) 04/23/2017  . Diabetes mellitus type 2 in nonobese ([place]) 04/23/2017  . Sjogren's disease ([place]) 04/03/2016  . Primary osteoarthritis of both hands 04/03/2016  . High risk medication use 04/03/2016  . Senile dementia, with behavioral disturbance ([place]) 08/16/2015  . Swelling 08/16/2015  . Diastolic dysfunction 74/94/4967  . Hypertonicity, bladder 07/08/2015  . Arterial hypotension   . Bradycardia 06/29/2015  . Chronic lower back pain 06/29/2015  . PAF (paroxysmal atrial fibrillation) ([place]) 04/18/2015  . Dehydration 04/18/2015  . Dementia without behavioral disturbance ([place]) 04/18/2015  . TIA (transient ischemic attack)  12/17/2014  . Chest pain 12/16/2014  . Acute encephalopathy 12/04/2014  . Fall   . AKI (acute kidney injury) ([place]) 12/03/2014  . History of cerebrovascular disease 09/24/2014  . Falls frequently 07/28/2014  . Infarction of parietal lobe ([place])   . Mild dementia ([place])   . CVA (cerebral infarction) 07/26/2014  . Atrial fibrillation with RVR ([place]) 07/03/2014  . Constipation 04/15/2014  . Mixed stress and urge urinary incontinence 11/03/2013  . Therapeutic opioid induced constipation 11/03/2013  . Hemorrhoid 11/03/2013  . Low back pain associated with a spinal disorder other than radiculopathy or spinal stenosis 11/03/2013  . Protein-calorie malnutrition, severe ([place]) 07/22/2013  . UTI (lower urinary tract infection) 07/20/2013  . Prolonged QT interval 07/20/2013  . Osteopenia 07/17/2013  . Palpitations 06/30/2013  . Hereditary and idiopathic peripheral neuropathy 05/22/2013  . Abnormality of gait 12/05/2012  . Headache(784.0) 09/05/2012  . Multinodular thyroid 01/15/2012  . Neck pain 01/15/2012  . Hypercholesterolemia 07/08/2010  . Tear film insufficiency 07/06/2009  . [place] SYNDROME 07/06/2009  . Urge urinary incontinence 01/06/2009  . COLONIC POLYPS, ADENOMATOUS, HX OF 04/15/2008  . MITRAL VALVE PROLAPSE 11/05/2007  . ANXIETY DEPRESSION 07/02/2007  . Mononeuritis 07/02/2007  . HYPERTENSION, BENIGN 07/02/2007  . GERD 07/02/2007  . Irritable bowel syndrome 07/02/2007  . Fibromyalgia 07/02/2007   Past Medical History:  Diagnosis Date  . Abnormality of gait   . Adenomatous polyp of colon 2002   86mm  . Allergic rhinitis   . Anxiety   . Anxiety and depression   . Chronic back pain   . Dementia without behavioral disturbance ([place])   . Depression   . Diabetes ([place])   . Diverticulosis of colon   . Dry eye syndrome   . Dysphagia   . Dysthymic disorder   . Fall   . Fibromyalgia   . GERD (gastroesophageal reflux disease)   . H/O hiatal hernia   . History of adverse  drug reaction   . History of cerebrovascular disease 09/24/2014  . History of recurrent UTIs   . Hypertension, benign   . Irritable bowel syndrome   . Low back pain syndrome   . Memory loss   . Mitral valve prolapse   . Paroxysmal A-fib ([place])   . Peripheral neuropathy    "both feet and legs"  . Physical deconditioning   . Sjogren's syndrome ([place])   . Therapeutic opioid-induced constipation ([facility])   . Thyroid nodule   . Urinary incontinence     Family History  Problem Relation Age of Onset  . Heart disease Father        heart attack  . Pneumonia Father   . Heart attack Mother   . Hypertension Mother   . Colon cancer Sister   . Kidney disease Daughter   . Asthma Daughter   . Arthritis  Daughter 66       osteo,  . Heart disease Son 66       stage 3 CHF(Diastolic /Systolic)  . Throat cancer Brother   . Hypertension Maternal Grandmother     Past Surgical History:  Procedure Laterality Date  . ABDOMINAL HYSTERECTOMY  1967  . APPENDECTOMY    . CARDIAC CATHETERIZATION  02/17/2003   normal L main, LAD free of disease, Cfx free of disease, RCA free of disease (Dr. Loni Muse. Little)  . CATARACT EXTRACTION, BILATERAL    . CHOLECYSTECTOMY  2000  . COLONOSCOPY W/ BIOPSIES     multiple  . DENTAL SURGERY     multiple tooth extractions  . ESOPHAGOGASTRODUODENOSCOPY (EGD) WITH ESOPHAGEAL DILATION N/A 08/23/2012   Procedure: ESOPHAGOGASTRODUODENOSCOPY (EGD) WITH ESOPHAGEAL DILATION;  Surgeon: Milus Banister, MD;  Location: [facility];  Service: Endoscopy;  Laterality: N/A;  . NASAL SEPTUM SURGERY  1980  . NM MYOCAR PERF WALL MOTION  2003   persantine - normal static and dynamic study w/apical thinning and presvered LV function, no ischemia  . SINUS EXPLORATION     ossifiying fibroma  . TEMPOROMANDIBULAR JOINT SURGERY  1986   Dr. Terence Lux  . TRANSTHORACIC ECHOCARDIOGRAM  2001   mild LVH, normal LV   Social History   Occupational History  . Occupation: Retired  Tobacco Use  . Smoking  status: Former Research scientist (life sciences)  . Smokeless tobacco: Never Used  . Tobacco comment: Quit at age 72   Vaping Use  . Vaping Use: Never used  Substance and Sexual Activity  . Alcohol use: No  . Drug use: Never  . Sexual activity: Never

## 2020-06-08 DIAGNOSIS — B373 Candidiasis of vulva and vagina: Secondary | ICD-10-CM | POA: Diagnosis not present

## 2020-06-08 DIAGNOSIS — N390 Urinary tract infection, site not specified: Secondary | ICD-10-CM | POA: Diagnosis not present

## 2020-06-16 ENCOUNTER — Ambulatory Visit: Payer: Medicare Other | Admitting: Internal Medicine

## 2020-06-16 DIAGNOSIS — M19042 Primary osteoarthritis, left hand: Secondary | ICD-10-CM | POA: Diagnosis not present

## 2020-06-16 DIAGNOSIS — F341 Dysthymic disorder: Secondary | ICD-10-CM | POA: Diagnosis not present

## 2020-06-16 DIAGNOSIS — I480 Paroxysmal atrial fibrillation: Secondary | ICD-10-CM | POA: Diagnosis not present

## 2020-06-16 DIAGNOSIS — S8265XD Nondisplaced fracture of lateral malleolus of left fibula, subsequent encounter for closed fracture with routine healing: Secondary | ICD-10-CM | POA: Diagnosis not present

## 2020-06-16 DIAGNOSIS — M3501 Sicca syndrome with keratoconjunctivitis: Secondary | ICD-10-CM | POA: Diagnosis not present

## 2020-06-16 DIAGNOSIS — F3175 Bipolar disorder, in partial remission, most recent episode depressed: Secondary | ICD-10-CM | POA: Diagnosis not present

## 2020-06-16 DIAGNOSIS — M797 Fibromyalgia: Secondary | ICD-10-CM | POA: Diagnosis not present

## 2020-06-16 DIAGNOSIS — I058 Other rheumatic mitral valve diseases: Secondary | ICD-10-CM | POA: Diagnosis not present

## 2020-06-16 DIAGNOSIS — I10 Essential (primary) hypertension: Secondary | ICD-10-CM | POA: Diagnosis not present

## 2020-06-16 DIAGNOSIS — M160 Bilateral primary osteoarthritis of hip: Secondary | ICD-10-CM | POA: Diagnosis not present

## 2020-06-16 DIAGNOSIS — F419 Anxiety disorder, unspecified: Secondary | ICD-10-CM | POA: Diagnosis not present

## 2020-06-16 DIAGNOSIS — E1140 Type 2 diabetes mellitus with diabetic neuropathy, unspecified: Secondary | ICD-10-CM | POA: Diagnosis not present

## 2020-06-16 DIAGNOSIS — F0391 Unspecified dementia with behavioral disturbance: Secondary | ICD-10-CM | POA: Diagnosis not present

## 2020-06-16 DIAGNOSIS — M5030 Other cervical disc degeneration, unspecified cervical region: Secondary | ICD-10-CM | POA: Diagnosis not present

## 2020-06-16 DIAGNOSIS — K219 Gastro-esophageal reflux disease without esophagitis: Secondary | ICD-10-CM | POA: Diagnosis not present

## 2020-06-16 DIAGNOSIS — M19041 Primary osteoarthritis, right hand: Secondary | ICD-10-CM | POA: Diagnosis not present

## 2020-06-18 ENCOUNTER — Other Ambulatory Visit: Payer: Self-pay | Admitting: Internal Medicine

## 2020-06-18 ENCOUNTER — Other Ambulatory Visit: Payer: Self-pay | Admitting: Orthopedic Surgery

## 2020-06-18 DIAGNOSIS — G6289 Other specified polyneuropathies: Secondary | ICD-10-CM

## 2020-06-18 DIAGNOSIS — M545 Low back pain, unspecified: Secondary | ICD-10-CM

## 2020-06-18 DIAGNOSIS — I10 Essential (primary) hypertension: Secondary | ICD-10-CM

## 2020-06-18 DIAGNOSIS — M3501 Sicca syndrome with keratoconjunctivitis: Secondary | ICD-10-CM

## 2020-06-18 DIAGNOSIS — K582 Mixed irritable bowel syndrome: Secondary | ICD-10-CM

## 2020-06-21 ENCOUNTER — Other Ambulatory Visit: Payer: Self-pay | Admitting: *Deleted

## 2020-06-21 DIAGNOSIS — G6289 Other specified polyneuropathies: Secondary | ICD-10-CM

## 2020-06-21 DIAGNOSIS — K582 Mixed irritable bowel syndrome: Secondary | ICD-10-CM

## 2020-06-21 MED ORDER — MEMANTINE HCL 10 MG PO TABS: 10.0000 mg | ORAL_TABLET | Freq: Two times a day (BID) | ORAL | 0 refills | Status: DC

## 2020-06-21 MED ORDER — LINACLOTIDE 290 MCG PO CAPS: ORAL_CAPSULE | ORAL | 1 refills | Status: DC

## 2020-06-21 NOTE — Telephone Encounter (Signed)
Received fax from [facility]

## 2020-06-21 NOTE — Addendum Note (Signed)
Addended by: Rafael Bihari A on: 06/21/2020 11:04 AM   Modules accepted: Orders

## 2020-06-23 ENCOUNTER — Telehealth: Payer: Self-pay | Admitting: Internal Medicine

## 2020-06-23 ENCOUNTER — Other Ambulatory Visit: Payer: Self-pay

## 2020-06-23 MED ORDER — DILTIAZEM HCL ER COATED BEADS 180 MG PO CP24: 180.0000 mg | ORAL_CAPSULE | Freq: Every day | ORAL | 1 refills | Status: DC

## 2020-06-23 NOTE — Telephone Encounter (Signed)
Can you please advise? I do not see Diltiazem on the list- but last note when she seen you was on the list. Patient does have follow up appointment scheduled, okay to call in?   Thanks!

## 2020-06-23 NOTE — Telephone Encounter (Signed)
I sent in what was previously on med list, and had patient daughter verify bottle at home that she was taking. Diltiazem 180 mg 24 hour tablet.   Thank you!

## 2020-06-23 NOTE — Telephone Encounter (Signed)
New Message:       Do explanation given. Ptaughter ca1led and said pt had been denied her Diltiazem and no explanation was given. She says the pt have an appt and her appt had been cancelled.

## 2020-06-23 NOTE — Telephone Encounter (Signed)
Yes .. ok to restart until she sees me.  Dr Lemmie Evens

## 2020-06-30 ENCOUNTER — Ambulatory Visit: Payer: Medicare Other | Admitting: Adult Health

## 2020-07-02 ENCOUNTER — Other Ambulatory Visit: Payer: Self-pay

## 2020-07-02 ENCOUNTER — Ambulatory Visit: Payer: Medicare Other | Admitting: Podiatry

## 2020-07-02 DIAGNOSIS — S91115A Laceration without foreign body of left lesser toe(s) without damage to nail, initial encounter: Secondary | ICD-10-CM | POA: Diagnosis not present

## 2020-07-02 DIAGNOSIS — B351 Tinea unguium: Secondary | ICD-10-CM | POA: Diagnosis not present

## 2020-07-02 DIAGNOSIS — M79609 Pain in unspecified limb: Secondary | ICD-10-CM

## 2020-07-02 NOTE — Progress Notes (Signed)
  Subjective:  Patient ID: Ariana [name], female    DOB: [birth year],  MRN: [id number]  Chief Complaint  Patient presents with  . Nail Problem    Bleeding nails    87 y.o. female presents with the above complaint. History confirmed with patient.  States she was cutting her nails and asked on the cost one of the toes to bleed.  Happened 2 days ago but is still not stopped bleeding.  States that it recurs every time she takes a step  Objective:  Physical Exam: warm, good capillary refill, no trophic changes or ulcerative lesions, normal DP and PT pulses and normal sensory exam.  Nails x10 elongation thickening pain to palpation small laceration superficial to the left third toe No images are attached to the encounter.  Assessment:   1. Pain due to onychomycosis of nail   2. Laceration of lesser toe of left foot without foreign body present or damage to nail, initial encounter      Plan:  Patient was evaluated and treated and all questions answered.  Onychomycosis, laceration of toe -Nails debrided secondary to pain. -Laceration was cauterized with silver nitrate to stop further bleeding. -Patient follow-up if issues persist   Procedure: Nail Debridement Type of Debridement: manual, sharp debridement. Instrumentation: Nail nipper, rotary burr. Number of Nails: 10     No follow-ups on file.

## 2020-07-05 ENCOUNTER — Other Ambulatory Visit: Payer: Self-pay | Admitting: Family

## 2020-07-05 ENCOUNTER — Other Ambulatory Visit: Payer: Self-pay | Admitting: Orthopedic Surgery

## 2020-07-05 DIAGNOSIS — I10 Essential (primary) hypertension: Secondary | ICD-10-CM

## 2020-07-05 DIAGNOSIS — G6289 Other specified polyneuropathies: Secondary | ICD-10-CM

## 2020-07-09 ENCOUNTER — Ambulatory Visit: Payer: Medicare Other | Admitting: Family

## 2020-07-09 ENCOUNTER — Encounter: Payer: Self-pay | Admitting: Adult Health

## 2020-07-09 ENCOUNTER — Ambulatory Visit (INDEPENDENT_AMBULATORY_CARE_PROVIDER_SITE_OTHER): Payer: Medicare Other | Admitting: Adult Health

## 2020-07-09 ENCOUNTER — Other Ambulatory Visit: Payer: Self-pay

## 2020-07-09 VITALS — BP 130/80 | HR 85 | Temp 97.8°F | Resp 16 | Ht 63.0 in | Wt 145.4 lb

## 2020-07-09 DIAGNOSIS — R300 Dysuria: Secondary | ICD-10-CM

## 2020-07-09 LAB — POCT URINALYSIS DIPSTICK
Glucose, UA: NEGATIVE
Nitrite, UA: NEGATIVE
Protein, UA: POSITIVE — AB
Spec Grav, UA: 1.02 (ref 1.010–1.025)
Urobilinogen, UA: NEGATIVE E.U./dL — AB
pH, UA: 5 (ref 5.0–8.0)

## 2020-07-09 MED ORDER — SACCHAROMYCES BOULARDII 250 MG PO CAPS: 250.0000 mg | ORAL_CAPSULE | Freq: Two times a day (BID) | ORAL | 0 refills | Status: AC

## 2020-07-09 MED ORDER — NITROFURANTOIN MONOHYD MACRO 100 MG PO CAPS: 100.0000 mg | ORAL_CAPSULE | Freq: Two times a day (BID) | ORAL | 0 refills | Status: AC

## 2020-07-09 NOTE — Patient Instructions (Signed)
Urinary Tract Infection, Adult A urinary tract infection (UTI) is an infection of any part of the urinary tract. The urinary tract includes:  The kidneys.  The ureters.  The bladder.  The urethra. These organs make, store, and get rid of pee (urine) in the body. What are the causes? This infection is caused by germs (bacteria) in your genital area. These germs grow and cause swelling (inflammation) of your urinary tract. What increases the risk? The following factors may make you more likely to develop this condition:  Using a small, thin tube (catheter) to drain pee.  Not being able to control when you pee or poop (incontinence).  Being female. If you are female, these things can increase the risk: ? Using these methods to prevent pregnancy:  A medicine that kills sperm (spermicide).  A device that blocks sperm (diaphragm). ? Having low levels of a female hormone (estrogen). ? Being pregnant. You are more likely to develop this condition if:  You have genes that add to your risk.  You are sexually active.  You take antibiotic medicines.  You have trouble peeing because of: ? A prostate that is bigger than normal, if you are female. ? A blockage in the part of your body that drains pee from the bladder. ? A kidney stone. ? A nerve condition that affects your bladder. ? Not getting enough to drink. ? Not peeing often enough.  You have other conditions, such as: ? Diabetes. ? A weak disease-fighting system (immune system). ? Sickle cell disease. ? Gout. ? Injury of the spine. What are the signs or symptoms? Symptoms of this condition include:  Needing to pee right away.  Peeing small amounts often.  Pain or burning when peeing.  Blood in the pee.  Pee that smells bad or not like normal.  Trouble peeing.  Pee that is cloudy.  Fluid coming from the vagina, if you are female.  Pain in the belly or lower back. Other symptoms include:  Vomiting.  Not  feeling hungry.  Feeling mixed up (confused). This may be the first symptom in older adults.  Being tired and grouchy (irritable).  A fever.  Watery poop (diarrhea). How is this treated?  Taking antibiotic medicine.  Taking other medicines.  Drinking enough water. In some cases, you may need to see a specialist. Follow these instructions at home: Medicines  Take over-the-counter and prescription medicines only as told by your doctor.  If you were prescribed an antibiotic medicine, take it as told by your doctor. Do not stop taking it even if you start to feel better. General instructions  Make sure you: ? Pee until your bladder is empty. ? Do not hold pee for a long time. ? Empty your bladder after sex. ? Wipe from front to back after peeing or pooping if you are a female. Use each tissue one time when you wipe.  Drink enough fluid to keep your pee pale yellow.  Keep all follow-up visits.   Contact a doctor if:  You do not get better after 1-2 days.  Your symptoms go away and then come back. Get help right away if:  You have very bad back pain.  You have very bad pain in your lower belly.  You have a fever.  You have chills.  You feeling like you will vomit or you vomit. Summary  A urinary tract infection (UTI) is an infection of any part of the urinary tract.  This condition is caused by   germs in your genital area.  There are many risk factors for a UTI.  Treatment includes antibiotic medicines.  Drink enough fluid to keep your pee pale yellow. This information is not intended to replace advice given to you by your health care provider. Make sure you discuss any questions you have with your health care provider. Document Revised: 09/12/2019 Document Reviewed: 09/12/2019 Elsevier Patient Education  2021 [employer]  

## 2020-07-09 NOTE — Progress Notes (Signed)
[facility] clinic  Provider:  Durenda Age  DNP  Code Status:   Full Code  Goals of Care:  Advanced Directives 07/09/2020  Does Patient Have a Medical Advance Directive? Yes  Type of Advance Directive [place]  Does patient want to make changes to medical advance directive? No - Patient declined  Copy of [place] in Chart? -  Would patient like information on creating a medical advance directive? -  Pre-existing out of facility DNR order (yellow form or pink MOST form) -     Chief Complaint  Patient presents with  . Acute Visit    Complains of possible UTI    HPI: Patient is a 87 y.o. female seen today for an acute visit for possible UTI. She came with a caregiver but does not want her caregiver in the exam room. Caregiver stated that resident was treated for UTI 2 weeks ago with Nitrofurantoin X 10 days. A week ago, she was noted to be sleepy and cranky. Caregiver reported that she wipes herself from front to back whenever she goes to the bathroom. No reported fever nor hematuria.   Past Medical History:  Diagnosis Date  . Abnormality of gait   . Adenomatous polyp of colon 2002   32mm  . Allergic rhinitis   . Anxiety   . Anxiety and depression   . Chronic back pain   . Dementia without behavioral disturbance ([place])   . Depression   . Diabetes ([place])   . Diverticulosis of colon   . Dry eye syndrome   . Dysphagia   . Dysthymic disorder   . Fall   . Fibromyalgia   . GERD (gastroesophageal reflux disease)   . H/O hiatal hernia   . History of adverse drug reaction   . History of cerebrovascular disease 09/24/2014  . History of recurrent UTIs   . Hypertension, benign   . Irritable bowel syndrome   . Low back pain syndrome   . Memory loss   . Mitral valve prolapse   . Paroxysmal A-fib ([place])   . Peripheral neuropathy    "both feet and legs"  . Physical deconditioning   . Sjogren's syndrome ([place])   . Therapeutic opioid-induced  constipation ([facility])   . Thyroid nodule   . Urinary incontinence     Past Surgical History:  Procedure Laterality Date  . ABDOMINAL HYSTERECTOMY  1967  . APPENDECTOMY    . CARDIAC CATHETERIZATION  02/17/2003   normal L main, LAD free of disease, Cfx free of disease, RCA free of disease (Dr. Loni Muse. [place])  . CATARACT EXTRACTION, BILATERAL    . CHOLECYSTECTOMY  2000  . COLONOSCOPY W/ BIOPSIES     multiple  . DENTAL SURGERY     multiple tooth extractions  . ESOPHAGOGASTRODUODENOSCOPY (EGD) WITH ESOPHAGEAL DILATION N/A 08/23/2012   Procedure: ESOPHAGOGASTRODUODENOSCOPY (EGD) WITH ESOPHAGEAL DILATION;  Surgeon: Milus Banister, MD;  Location: [facility];  Service: Endoscopy;  Laterality: N/A;  . NASAL SEPTUM SURGERY  1980  . NM MYOCAR PERF WALL MOTION  2003   persantine - normal static and dynamic study w/apical thinning and presvered LV function, no ischemia  . SINUS EXPLORATION     ossifiying fibroma  . TEMPOROMANDIBULAR JOINT SURGERY  1986   Dr. Terence Lux  . TRANSTHORACIC ECHOCARDIOGRAM  2001   mild LVH, normal LV    Allergies  Allergen Reactions  . Banana Nausea And Vomiting  . Codeine Nausea Only    unless given  with Phenergan  . Klonopin [Clonazepam] Other (See Comments)    Causes hallucination   . Meperidine Hcl Nausea Only    unless given with Phenergan  . Norflex [Orphenadrine Citrate] Nausea Only    Unless given with Phenergan  . Oxycodone-Acetaminophen Nausea Only    unless given with phenergan  . Propoxyphene Hcl Nausea Only    unless given with phenergan  . Zoloft [Sertraline Hcl] Other (See Comments)    Caused lethargy  . Doxycycline Other (See Comments)    Unknown reaction  . Naproxen Other (See Comments)    Unknown reaction  . Penicillins Other (See Comments)    Unknown reaction Has patient had a PCN reaction causing immediate rash, facial/tongue/throat swelling, SOB or lightheadedness with hypotension: Unknown Has patient had a PCN reaction causing severe  rash involving mucus membranes or skin necrosis: Unknown Has patient had a PCN reaction that required hospitalization: pt was in the hospital at time of reaction Has patient had a PCN reaction occurring within the last 10 years: Unknown If all of the above answers are "NO", then may proceed with Cephalos  . Phenothiazines Other (See Comments)    Unknown reaction  . Stelazine Other (See Comments)    Unknown reaction  . Sulfamethoxazole-Trimethoprim Other (See Comments)    Unknown reaction  . Tolectin [Tolmetin Sodium] Other (See Comments)    Unknown reaction  . Tramadol Other (See Comments)    Unknown reaction    Outpatient Encounter Medications as of 07/09/2020  Medication Sig  . acetaminophen (TYLENOL) 500 MG tablet Take 1,000 mg by mouth 2 (two) times daily.  Marland Kitchen antiseptic oral rinse (BIOTENE) LIQD 15 mLs by Mouth Rinse route as needed.  . Capsaicin-Menthol-Methyl Sal (CAPSAICIN-METHYL SAL-MENTHOL) 0.025-1-12 % CREA Apply 1 application topically 2 (two) times daily.  . carboxymethylcellulose (REFRESH PLUS) 0.5 % SOLN Place 1 drop into both eyes in the morning, at noon, in the evening, and at bedtime.  Marland Kitchen CRANBERRY PO Take 2 tablets by mouth daily. AZO  . diltiazem (CARDIZEM CD) 180 MG 24 hr capsule Take 1 capsule (180 mg total) by mouth daily.  . DULoxetine (CYMBALTA) 30 MG capsule Take 1 capsule (30 mg total) by mouth daily. Take along with 60 mg to = 90 mg for back pain and depression  . DULoxetine (CYMBALTA) 60 MG capsule TAKE 1 CAPSULE BY MOUTH ONCE DAILY. TAKE ALONG WITH 30MG  TO = 90 MG FOR BACK PAIN AND DEPRESSION  . furosemide (LASIX) 40 MG tablet Take 1 tablet by mouth once daily  . glucose blood (ONETOUCH VERIO) test strip Use to test blood sugar daily. Dx:E08.65  . Lancets Micro Thin 33G MISC Use to test blood sugar daily. Dx: E08.65  . lansoprazole (PREVACID) 30 MG capsule TAKE 1 CAPSULE BY MOUTH ONCE DAILY AT NOON  . linaclotide (LINZESS) 290 MCG CAPS capsule TAKE 1 CAPSULE  BY MOUTH EVERY DAY AT BEDTIME  . LINZESS 290 MCG CAPS capsule TAKE 1 CAPSULE BY MOUTH EVERY DAY AT BEDTIME  . loratadine (CLARITIN) 10 MG tablet Take 1 tablet (10 mg total) by mouth daily.  . memantine (NAMENDA) 10 MG tablet Take 1 tablet (10 mg total) by mouth 2 (two) times daily.  . mirabegron ER (MYRBETRIQ) 50 MG TB24 tablet Take 1 tablet (50 mg total) by mouth daily.  . pilocarpine (SALAGEN) 5 MG tablet Take 1 tablet by mouth twice daily  . polyethylene glycol (MIRALAX / GLYCOLAX) packet Take 17 g by mouth daily as needed for mild constipation (  constipation). Mix with 8 oz. water or juice  . potassium chloride SA (KLOR-CON) 20 MEQ tablet Take 1 tablet by mouth once daily  . pregabalin (LYRICA) 100 MG capsule Take 1 capsule by mouth twice daily  . senna-docusate (SENNA S) 8.6-50 MG tablet Take 1 tablet by mouth daily.  . sitaGLIPtin (JANUVIA) 100 MG tablet Take 1 tablet (100 mg total) by mouth daily.   No facility-administered encounter medications on file as of 07/09/2020.    Review of Systems:  Review of Systems  Constitutional: Negative for appetite change and fever.  HENT: Negative for congestion.   Eyes: Negative for discharge and itching.  Respiratory: Negative for cough and wheezing.   Cardiovascular: Negative for leg swelling.  Gastrointestinal: Negative for abdominal pain.  Genitourinary: Positive for difficulty urinating and dysuria.  Musculoskeletal: Positive for back pain.  Neurological: Positive for headaches. Negative for dizziness.  Psychiatric/Behavioral: Positive for confusion.    Health Maintenance  Topic Date Due  . OPHTHALMOLOGY EXAM  Never done  . Zoster Vaccines- Shingrix (1 of 2) Never done  . FOOT EXAM  01/28/2020  . COVID-19 Vaccine (4 - Booster for Moderna series) 03/16/2020  . INFLUENZA VACCINE  09/13/2020  . HEMOGLOBIN A1C  09/29/2020  . TETANUS/TDAP  12/09/2023  . DEXA SCAN  Completed  . PNA vac Low Risk Adult  Completed  . HPV VACCINES  Aged Out   . URINE MICROALBUMIN  Discontinued    Physical Exam: Vitals:   07/09/20 1437  Height: 5\' 3"  (1.6 m)   Body mass index is 25.69 kg/m. Physical Exam HENT:     Head: Normocephalic and atraumatic.  Eyes:     Conjunctiva/sclera: Conjunctivae normal.  Cardiovascular:     Rate and Rhythm: Normal rate and regular rhythm.  Pulmonary:     Effort: Pulmonary effort is normal.     Breath sounds: Normal breath sounds.  Abdominal:     General: Bowel sounds are normal.  Musculoskeletal:        General: Normal range of motion.     Cervical back: Normal range of motion and neck supple.     Right lower leg: No edema.     Left lower leg: No edema.  Skin:    General: Skin is warm and dry.  Neurological:     Mental Status: She is alert. She is disoriented.     Comments: Thinks it is September  Psychiatric:        Behavior: Behavior normal.     Labs reviewed: Basic Metabolic Panel: Recent Labs    04/24/20 0547 04/25/20 0628 05/20/20 1550  NA 134* 136 138  K 4.0 3.8 4.3  CL 98 102 103  CO2 25 25 23   GLUCOSE 147* 161* 93  BUN 21 12 16   CREATININE 0.85 0.63 0.94*  CALCIUM 9.3 9.6 9.7   CBC: Recent Labs    04/01/20 1614 04/23/20 1051 04/24/20 0547 04/25/20 0628 05/20/20 1550  WBC 8.8   < > 7.5 7.0 7.1  NEUTROABS 5,878  --   --   --  4,487  HGB 13.3   < > 13.1 13.5 12.8  HCT 39.9   < > 40.0 41.0 39.2  MCV 93.7   < > 94.8 94.0 94.5  PLT 242   < > 241 246 206   < > = values in this interval not displayed.    Lab Results  Component Value Date   HGBA1C 6.8 (H) 04/01/2020      Assessment/Plan  1. Dysuria  - POC Urinalysis Dipstick showed  Protein + and leukocytes 3+ - nitrofurantoin, macrocrystal-monohydrate, (MACROBID) 100 MG capsule; Take 1 capsule (100 mg total) by mouth 2 (two) times daily for 7 days.  Dispense: 14 capsule; Refill: 0 - saccharomyces boulardii (FLORASTOR) 250 MG capsule; Take 1 capsule (250 mg total) by mouth 2 (two) times daily for 10 days.   Dispense: 20 capsule; Refill: 0 -  Encouraged to drink more water - discussed importance of wiping from front to back  Labs/tests ordered:  Urine dipstick and urine culture   Next appt:  As needed

## 2020-07-11 LAB — URINE CULTURE
MICRO NUMBER:: 11947501
SPECIMEN QUALITY:: ADEQUATE

## 2020-07-11 NOTE — Progress Notes (Signed)
Urine culture bacteria isolate is E. Coli which is sensitive to Nitrofurantoin which was prescribed during the office visit.

## 2020-07-19 ENCOUNTER — Other Ambulatory Visit: Payer: Self-pay

## 2020-07-19 DIAGNOSIS — M545 Low back pain, unspecified: Secondary | ICD-10-CM

## 2020-07-19 DIAGNOSIS — G6289 Other specified polyneuropathies: Secondary | ICD-10-CM

## 2020-07-19 DIAGNOSIS — M3501 Sicca syndrome with keratoconjunctivitis: Secondary | ICD-10-CM

## 2020-07-19 DIAGNOSIS — K582 Mixed irritable bowel syndrome: Secondary | ICD-10-CM

## 2020-07-19 MED ORDER — SENNOSIDES 8.6 MG PO TABS: 1.0000 | ORAL_TABLET | Freq: Every day | ORAL | 0 refills | Status: DC

## 2020-07-19 MED ORDER — PREGABALIN 100 MG PO CAPS: 100.0000 mg | ORAL_CAPSULE | Freq: Two times a day (BID) | ORAL | 0 refills | Status: DC

## 2020-07-19 MED ORDER — LINACLOTIDE 290 MCG PO CAPS
ORAL_CAPSULE | ORAL | 1 refills | Status: DC
Start: 2020-07-19 — End: 2020-12-30

## 2020-07-19 MED ORDER — PILOCARPINE HCL 5 MG PO TABS: 5.0000 mg | ORAL_TABLET | Freq: Two times a day (BID) | ORAL | 0 refills | Status: DC

## 2020-07-19 MED ORDER — ALPRAZOLAM 0.25 MG PO TABS: 0.2500 mg | ORAL_TABLET | Freq: Two times a day (BID) | ORAL | 0 refills | Status: DC | PRN

## 2020-07-19 NOTE — Telephone Encounter (Signed)
Patient's daughter Peeler) called in to request a 90 day supply request for the following medications:Xanax 0.25mg  (aware many not get 90 days), Pilocarpine 5 mg, EQ Senna, Linzess 290 mg, Pregabalin 100 mg, Potassium chloride. She reports that patient was only giving 30 days and insurance states that 90 days should be requested for patient medications.  Message routed to Windell Moulding, NP for approval.  Please advise.

## 2020-07-19 NOTE — Telephone Encounter (Signed)
Patient's daughter Gengler) per DRP was advised and made aware that controlled substances could not be refilled as 90 days.

## 2020-07-19 NOTE — Telephone Encounter (Signed)
She may have 90 day supple on non-controlled substances. I cannot prescribe xanax and Lyrica fr 90 day supply.

## 2020-07-21 ENCOUNTER — Other Ambulatory Visit: Payer: Self-pay | Admitting: Family

## 2020-07-21 DIAGNOSIS — I10 Essential (primary) hypertension: Secondary | ICD-10-CM

## 2020-07-30 DIAGNOSIS — R300 Dysuria: Secondary | ICD-10-CM | POA: Diagnosis not present

## 2020-07-30 DIAGNOSIS — R8271 Bacteriuria: Secondary | ICD-10-CM | POA: Diagnosis not present

## 2020-08-09 ENCOUNTER — Other Ambulatory Visit: Payer: Self-pay

## 2020-08-09 ENCOUNTER — Encounter: Payer: Self-pay | Admitting: Physician Assistant

## 2020-08-09 ENCOUNTER — Ambulatory Visit (INDEPENDENT_AMBULATORY_CARE_PROVIDER_SITE_OTHER): Payer: Medicare Other | Admitting: Physician Assistant

## 2020-08-09 VITALS — BP 130/68 | HR 86 | Wt 148.0 lb

## 2020-08-09 DIAGNOSIS — E119 Type 2 diabetes mellitus without complications: Secondary | ICD-10-CM

## 2020-08-09 DIAGNOSIS — I10 Essential (primary) hypertension: Secondary | ICD-10-CM

## 2020-08-09 DIAGNOSIS — Z8673 Personal history of transient ischemic attack (TIA), and cerebral infarction without residual deficits: Secondary | ICD-10-CM

## 2020-08-09 DIAGNOSIS — F03C0 Unspecified dementia, severe, without behavioral disturbance, psychotic disturbance, mood disturbance, and anxiety: Secondary | ICD-10-CM

## 2020-08-09 DIAGNOSIS — F0390 Unspecified dementia without behavioral disturbance: Secondary | ICD-10-CM

## 2020-08-09 DIAGNOSIS — I480 Paroxysmal atrial fibrillation: Secondary | ICD-10-CM | POA: Diagnosis not present

## 2020-08-09 MED ORDER — DILTIAZEM HCL ER COATED BEADS 180 MG PO CP24: 180.0000 mg | ORAL_CAPSULE | Freq: Every day | ORAL | 3 refills | Status: DC

## 2020-08-09 NOTE — Progress Notes (Signed)
Cardiology Office Note:    Date:  08/11/2020   ID:  Ariana [name], DOB 01-24-1930, MRN [id number]  PCP:  Sandrea Hughs, NP   [facility] Providers Cardiologist:  Pixie Casino, MD     Referring MD: Gayland Curry, DO   Chief Complaint  Patient presents with   Follow-up    Seen for Dr. Debara Pickett    History of Present Illness:    Ariana [name] is a 87 y.o. female with a hx of hypertension, Sjogren's syndrome, fibromyalgia, DM2, paroxysmal atrial fibrillation, dementia, CVA and history of mitral valve prolapse.  Cardiac catheterization in January 2005 showed normal coronary arteries.  In 2017, patient was admitted for UTI and found to have bradycardia, beta-blocker was discontinued and she remained on Cardizem.  Last echocardiogram obtained on 04/25/2017 showed EF 60 to 65%, moderate LVH, normal left atrial size.  Patient was last seen by Dr. Debara Pickett in March 2021, at which time, she was struggling with memory issues however was doing well from cardiac perspective.  She was not anticoagulated given poor compliance and a significant fall risk.  She was seen in the ED on 04/16/2020 after falling in the bathtub and had a left ankle fracture.  Unfortunately she had a recurrent fall and was brought back to the hospital on 04/23/2020, CT of the head showed a small volume of acute right frontotemporal subarachnoid hemorrhage and remote right subinsular in the right frontal infarct with chronic microvascular ischemic disease.  Neurosurgery did not recommend any further treatment for her subarachnoid hemorrhage.  She was eventually discharged to skilled nursing facility.  Patient presents today for follow-up.  Her thought is still quite tangential.  She is accompanied by her caretaker.  It is her daughter who orders her medication. Her primary concern is back pain which is sounds more like spinal issue.  Her second concern is pain in the left leg at rest, she says this has been going on for several months  maybe longer, previous venous Doppler obtained in March 2022 was negative for DVT.  She denies any chest pain or shortness of breath.  Blood pressure and heart rate are very well controlled on the current therapy.  Physical exam revealed regular heart rate consistent with sinus rhythm.  Last EKG obtained in March also showed normal sinus rhythm as well.  At this time I recommend continue to follow-up on yearly basis.  Past Medical History:  Diagnosis Date   Abnormality of gait    Adenomatous polyp of colon 2002   52mm   Allergic rhinitis    Anxiety    Anxiety and depression    Chronic back pain    Dementia without behavioral disturbance (HCC)    Depression    Diabetes ([place])    Diverticulosis of colon    Dry eye syndrome    Dysphagia    Dysthymic disorder    Fall    Fibromyalgia    GERD (gastroesophageal reflux disease)    H/O hiatal hernia    History of adverse drug reaction    History of cerebrovascular disease 09/24/2014   History of recurrent UTIs    Hypertension, benign    Irritable bowel syndrome    Low back pain syndrome    Memory loss    Mitral valve prolapse    Paroxysmal A-fib (HCC)    Peripheral neuropathy    "both feet and legs"   Physical deconditioning    Sjogren's syndrome ([place])    Therapeutic  opioid-induced constipation (OIC)    Thyroid nodule    Urinary incontinence     Past Surgical History:  Procedure Laterality Date   ABDOMINAL HYSTERECTOMY  1967   APPENDECTOMY     CARDIAC CATHETERIZATION  02/17/2003   normal L main, LAD free of disease, Cfx free of disease, RCA free of disease (Dr. Loni Muse. Little)   CATARACT EXTRACTION, BILATERAL     CHOLECYSTECTOMY  2000   COLONOSCOPY W/ BIOPSIES     multiple   DENTAL SURGERY     multiple tooth extractions   ESOPHAGOGASTRODUODENOSCOPY (EGD) WITH ESOPHAGEAL DILATION N/A 08/23/2012   Procedure: ESOPHAGOGASTRODUODENOSCOPY (EGD) WITH ESOPHAGEAL DILATION;  Surgeon: Milus Banister, MD;  Location: [facility];  Service:  Endoscopy;  Laterality: N/A;   NASAL SEPTUM SURGERY  1980   NM MYOCAR PERF WALL MOTION  2003   persantine - normal static and dynamic study w/apical thinning and presvered LV function, no ischemia   SINUS EXPLORATION     ossifiying fibroma   TEMPOROMANDIBULAR JOINT SURGERY  1986   Dr. Terence Lux   TRANSTHORACIC ECHOCARDIOGRAM  2001   mild LVH, normal LV    Current Medications: Current Meds  Medication Sig   acetaminophen (TYLENOL) 500 MG tablet Take 1,000 mg by mouth 2 (two) times daily.   ALPRAZolam (XANAX) 0.25 MG tablet Take 1 tablet (0.25 mg total) by mouth 2 (two) times daily as needed for anxiety.   antiseptic oral rinse (BIOTENE) LIQD 15 mLs by Mouth Rinse route as needed.   Capsaicin-Menthol-Methyl Sal (CAPSAICIN-METHYL SAL-MENTHOL) 0.025-1-12 % CREA Apply 1 application topically 2 (two) times daily.   carboxymethylcellulose (REFRESH PLUS) 0.5 % SOLN Place 1 drop into both eyes in the morning, at noon, in the evening, and at bedtime.   CRANBERRY PO Take 2 tablets by mouth daily. AZO   DULoxetine (CYMBALTA) 30 MG capsule Take 1 capsule (30 mg total) by mouth daily. Take along with 60 mg to = 90 mg for back pain and depression   DULoxetine (CYMBALTA) 60 MG capsule TAKE 1 CAPSULE BY MOUTH ONCE DAILY. TAKE ALONG WITH 30MG  TO = 90 MG FOR BACK PAIN AND DEPRESSION   furosemide (LASIX) 40 MG tablet Take 1 tablet by mouth once daily   glucose blood (ONETOUCH VERIO) test strip Use to test blood sugar daily. Dx:E08.65   Lancets Micro Thin 33G MISC Use to test blood sugar daily. Dx: E08.65   lansoprazole (PREVACID) 30 MG capsule TAKE 1 CAPSULE BY MOUTH ONCE DAILY AT NOON   linaclotide (LINZESS) 290 MCG CAPS capsule TAKE 1 CAPSULE BY MOUTH EVERY DAY AT BEDTIME   LINZESS 290 MCG CAPS capsule TAKE 1 CAPSULE BY MOUTH EVERY DAY AT BEDTIME   loratadine (CLARITIN) 10 MG tablet Take 1 tablet (10 mg total) by mouth daily.   memantine (NAMENDA) 10 MG tablet Take 1 tablet (10 mg total) by mouth 2 (two)  times daily.   mirabegron ER (MYRBETRIQ) 50 MG TB24 tablet Take 1 tablet (50 mg total) by mouth daily.   pilocarpine (SALAGEN) 5 MG tablet Take 1 tablet (5 mg total) by mouth 2 (two) times daily.   polyethylene glycol (MIRALAX / GLYCOLAX) packet Take 17 g by mouth daily as needed for mild constipation (constipation). Mix with 8 oz. water or juice   potassium chloride SA (KLOR-CON) 20 MEQ tablet Take 1 tablet by mouth once daily   pregabalin (LYRICA) 100 MG capsule Take 1 capsule (100 mg total) by mouth 2 (two) times daily.   senna (SENOKOT)  8.6 MG tablet Take 1 tablet (8.6 mg total) by mouth daily.   senna-docusate (SENNA S) 8.6-50 MG tablet Take 1 tablet by mouth daily.   sitaGLIPtin (JANUVIA) 100 MG tablet Take 1 tablet (100 mg total) by mouth daily.   [DISCONTINUED] diltiazem (CARDIZEM CD) 180 MG 24 hr capsule Take 1 capsule (180 mg total) by mouth daily.     Allergies:   Banana, Codeine, Klonopin [clonazepam], Meperidine hcl, Norflex [orphenadrine citrate], Oxycodone-acetaminophen, Propoxyphene hcl, Zoloft [sertraline hcl], Doxycycline, Naproxen, Penicillins, Phenothiazines, Stelazine, Sulfamethoxazole-trimethoprim, Tolectin [tolmetin sodium], and Tramadol   Social History   Socioeconomic History   Marital status: Widowed    Spouse name: Not on file   Number of children: 2   Years of education: 75   Highest education level: Not on file  Occupational History   Occupation: Retired  Tobacco Use   Smoking status: Former    Pack years: 0.00   Smokeless tobacco: Never   Tobacco comments:    Quit at age 47   Vaping Use   Vaping Use: Never used  Substance and Sexual Activity   Alcohol use: No   Drug use: Never   Sexual activity: Never  Other Topics Concern   Not on file  Social History Narrative   Patient lives at home 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    Patient is Widowed.    Patient has 2 children.    Patient is retired.    Former smoker   Alcohol none   Exercise Walk,  exercise chair 4 days a week   POA    Walks with cane      Patient drinks about 1-2 cups of hot tea daily.   Patient is right handed.               Social Determinants of Health   Financial Resource Strain: Not on file  Food Insecurity: Not on file  Transportation Needs: Not on file  Physical Activity: Not on file  Stress: Not on file  Social Connections: Not on file     Family History: The patient's family history includes Arthritis (age of onset: 65) in her daughter; Asthma in her daughter; Colon cancer in her sister; Heart attack in her mother; Heart disease in her father; Heart disease (age of onset: 9) in her son; Hypertension in her maternal grandmother and mother; Kidney disease in her daughter; Pneumonia in her father; Throat cancer in her brother.  ROS:   Please see the history of present illness.     All other systems reviewed and are negative.  EKGs/Labs/Other Studies Reviewed:    The following studies were reviewed today:  Echo 04/25/2017 LV EF: 60% -   65%   -------------------------------------------------------------------  Indications:      Atrial fibrillation - 427.31.   -------------------------------------------------------------------  History:   PMH:   Chest pain.  Atrial fibrillation.  Stroke.  Risk  factors:  Hypertension.   -------------------------------------------------------------------  Study Conclusions   - Left ventricle: The cavity size was normal. Wall thickness was    increased in a pattern of moderate LVH. Systolic function was    normal. The estimated ejection fraction was in the range of 60%    to 65%. Wall motion was normal; there were no regional wall    motion abnormalities. The study is not technically sufficient to    allow evaluation of LV diastolic function.  - Left atrium: The atrium was normal in size.  - Inferior vena cava: The vessel  was normal in size. The    respirophasic diameter changes were in the normal range  (>= 50%),    consistent with normal central venous pressure.   Impressions:   - LVEF 60-65%, moderate LVH, normal wall motion, normal LA size,    normal IVC.   EKG:  EKG is not ordered today.    Recent Labs: 05/20/2020: BUN 16; Creat 0.94; Hemoglobin 12.8; Platelets 206; Potassium 4.3; Sodium 138  Recent Lipid Panel    Component Value Date/Time   CHOL 183 09/06/2017 0938   TRIG 113 09/06/2017 0938   HDL 63 09/06/2017 0938   CHOLHDL 2.9 09/06/2017 0938   VLDL 23 12/13/2015 1120   LDLCALC 99 09/06/2017 0938   LDLDIRECT 65.7 07/08/2010 1335     Risk Assessment/Calculations:    CHA2DS2-VASc Score = 7  This indicates a 11.2% annual risk of stroke. The patient's score is based upon: CHF History: No HTN History: Yes Diabetes History: Yes Stroke History: Yes Vascular Disease History: No Age Score: 2 Gender Score: 1         Physical Exam:    VS:  BP 130/68   Pulse 86   Wt 148 lb (67.1 kg)   SpO2 98%   BMI 26.22 kg/m     Wt Readings from Last 3 Encounters:  08/09/20 148 lb (67.1 kg)  07/09/20 145 lb 6.4 oz (66 kg)  05/20/20 145 lb (65.8 kg)     GEN:  Well nourished, well developed in no acute distress HEENT: Normal NECK: No JVD; No carotid bruits LYMPHATICS: No lymphadenopathy CARDIAC: RRR, no murmurs, rubs, gallops RESPIRATORY:  Clear to auscultation without rales, wheezing or rhonchi  ABDOMEN: Soft, non-tender, non-distended MUSCULOSKELETAL:  No edema; No deformity  SKIN: Warm and dry NEUROLOGIC:  Alert and oriented x 3 PSYCHIATRIC:  Normal affect   ASSESSMENT:    1. PAF (paroxysmal atrial fibrillation) ([place])   2. Primary hypertension   3. Controlled type 2 diabetes mellitus without complication, without long-term current use of insulin ([place])   4. H/O: CVA (cerebrovascular accident)   5. Severe dementia ([place])    PLAN:    In order of problems listed above:  PAF: Continue diltiazem.  Maintaining sinus rhythm.  Not on anticoagulation due to fall risk  and poor compliance.  Hypertension: Blood pressure is stable  DM2: Managed by primary care provider  History of CVA: No recurrence  Severe dementia: Poor compliance with medication by herself.  She has a caretaker and her daughter manage her medication.        Medication Adjustments/Labs and Tests Ordered: Current medicines are reviewed at length with the patient today.  Concerns regarding medicines are outlined above.  No orders of the defined types were placed in this encounter.  Meds ordered this encounter  Medications   diltiazem (CARDIZEM CD) 180 MG 24 hr capsule    Sig: Take 1 capsule (180 mg total) by mouth daily.    Dispense:  90 capsule    Refill:  3    Patient Instructions  Medication Instructions:  Your physician recommends that you continue on your current medications as directed. Please refer to the Current Medication list given to you today.  *If you need a refill on your cardiac medications before your next appointment, please call your pharmacy*  Lab Work: NONE ordered at this time of appointment   If you have labs (blood work) drawn today and your tests are completely normal, you will receive your results only by: MyChart  Message (if you have MyChart) OR A paper copy in the mail If you have any lab test that is abnormal or we need to change your treatment, we will call you to review the results.  Testing/Procedures: NONE ordered at this time of appointment   Follow-Up: At [facility], you and your health needs are our priority.  As part of our continuing mission to provide you with exceptional heart care, we have created designated Provider Care Teams.  These Care Teams include your primary Cardiologist (physician) and Advanced Practice Providers (APPs -  Physician Assistants and Nurse Practitioners) who all work together to provide you with the care you need, when you need it.  We recommend signing up for the patient portal called "MyChart".  Sign up  information is provided on this After Visit Summary.  MyChart is used to connect with patients for Virtual Visits (Telemedicine).  Patients are able to view lab/test results, encounter notes, upcoming appointments, etc.  Non-urgent messages can be sent to your provider as well.   To learn more about what you can do with MyChart, go to [email or web address].    Your next appointment:   12 month(s)  The format for your next appointment:   In Person  Provider:   K. Mali Hilty, MD  Other Instructions    Signed, Almyra Deforest, [place]  08/11/2020 10:54 PM    [place]

## 2020-08-09 NOTE — Patient Instructions (Signed)
Medication Instructions:  Your physician recommends that you continue on your current medications as directed. Please refer to the Current Medication list given to you today.  *If you need a refill on your cardiac medications before your next appointment, please call your pharmacy*  Lab Work: NONE ordered at this time of appointment   If you have labs (blood work) drawn today and your tests are completely normal, you will receive your results only by: MyChart Message (if you have MyChart) OR A paper copy in the mail If you have any lab test that is abnormal or we need to change your treatment, we will call you to review the results.  Testing/Procedures: NONE ordered at this time of appointment   Follow-Up: At [facility], you and your health needs are our priority.  As part of our continuing mission to provide you with exceptional heart care, we have created designated Provider Care Teams.  These Care Teams include your primary Cardiologist (physician) and Advanced Practice Providers (APPs -  Physician Assistants and Nurse Practitioners) who all work together to provide you with the care you need, when you need it.  We recommend signing up for the patient portal called "MyChart".  Sign up information is provided on this After Visit Summary.  MyChart is used to connect with patients for Virtual Visits (Telemedicine).  Patients are able to view lab/test results, encounter notes, upcoming appointments, etc.  Non-urgent messages can be sent to your provider as well.   To learn more about what you can do with MyChart, go to [email or web address].    Your next appointment:   12 month(s)  The format for your next appointment:   In Person  Provider:   K. Chad Hilty, MD  Other Instructions   

## 2020-08-11 ENCOUNTER — Encounter: Payer: Self-pay | Admitting: Physician Assistant

## 2020-08-31 ENCOUNTER — Ambulatory Visit: Payer: Self-pay | Admitting: Family

## 2020-08-31 ENCOUNTER — Telehealth: Payer: Self-pay | Admitting: *Deleted

## 2020-08-31 NOTE — Telephone Encounter (Signed)
Wally, daughter, called and stated that last night husband checked with mail and all the Rx letters has to do with what the Hospital done. Sending the papers for Dr. To clarify.  Stated that they are confused about medications and not sure what is going on. Bringing papers received to appointment.

## 2020-08-31 NOTE — Telephone Encounter (Signed)
Will evaluate paper work during visit

## 2020-09-02 DIAGNOSIS — Z1231 Encounter for screening mammogram for malignant neoplasm of breast: Secondary | ICD-10-CM | POA: Diagnosis not present

## 2020-09-02 LAB — HM MAMMOGRAPHY

## 2020-09-06 ENCOUNTER — Encounter: Payer: Self-pay | Admitting: *Deleted

## 2020-09-16 ENCOUNTER — Other Ambulatory Visit: Payer: Self-pay | Admitting: Family

## 2020-09-16 DIAGNOSIS — G6289 Other specified polyneuropathies: Secondary | ICD-10-CM

## 2020-10-01 ENCOUNTER — Other Ambulatory Visit: Payer: Self-pay | Admitting: Family

## 2020-10-01 DIAGNOSIS — I10 Essential (primary) hypertension: Secondary | ICD-10-CM

## 2020-10-08 ENCOUNTER — Other Ambulatory Visit: Payer: Self-pay | Admitting: *Deleted

## 2020-10-08 DIAGNOSIS — G6289 Other specified polyneuropathies: Secondary | ICD-10-CM

## 2020-10-08 MED ORDER — DULOXETINE HCL 30 MG PO CPEP: 30.0000 mg | ORAL_CAPSULE | Freq: Every day | ORAL | 0 refills | Status: DC

## 2020-10-08 NOTE — Telephone Encounter (Signed)
Pharmacy requested refill.  Pended Rx and sent to [facility] for approval due to HIGH ALERT Warning.  

## 2020-10-14 ENCOUNTER — Telehealth: Payer: Self-pay

## 2020-10-14 NOTE — Telephone Encounter (Signed)
[name] (patients caregiver) called asking to rearrange patients pending routine visit appointment, scheduled for 10/20/2020.  I informed [name] that I would be delighted to assist her, yet I do not have the authorization on file to speak with her.  [name] passed the phone to Ms.Trilby Drummer and she gave a verbal authorization for me to speak with [name]. I encouraged Ms.Brower to fill out the necessary paperwork the next time she is in office to make interactions such as this more seamless and she verbalized understanding.  Call was resumed with [name] and we rescheduled patients pending appointment and also scheduled her for an AWV in which she is due for 11/12/2020 or after.

## 2020-10-20 ENCOUNTER — Other Ambulatory Visit: Payer: Self-pay | Admitting: *Deleted

## 2020-10-20 ENCOUNTER — Ambulatory Visit: Payer: Medicare Other | Admitting: Family

## 2020-10-20 DIAGNOSIS — G6289 Other specified polyneuropathies: Secondary | ICD-10-CM

## 2020-10-20 DIAGNOSIS — M545 Low back pain, unspecified: Secondary | ICD-10-CM

## 2020-10-20 MED ORDER — PREGABALIN 100 MG PO CAPS: 100.0000 mg | ORAL_CAPSULE | Freq: Two times a day (BID) | ORAL | 1 refills | Status: DC

## 2020-10-20 NOTE — Telephone Encounter (Signed)
Pharmacy requested refill.  ?Pended Rx and sent to [facility] for approval.  ?

## 2020-10-22 ENCOUNTER — Other Ambulatory Visit: Payer: Self-pay | Admitting: Orthopedic Surgery

## 2020-10-22 DIAGNOSIS — M3501 Sicca syndrome with keratoconjunctivitis: Secondary | ICD-10-CM

## 2020-10-27 ENCOUNTER — Other Ambulatory Visit: Payer: Self-pay | Admitting: *Deleted

## 2020-10-27 MED ORDER — ALPRAZOLAM 0.25 MG PO TABS: 0.2500 mg | ORAL_TABLET | Freq: Two times a day (BID) | ORAL | 0 refills | Status: DC | PRN

## 2020-10-27 NOTE — Telephone Encounter (Signed)
[facility] requested refill.  Epic LR: 07/19/2020 No contract on File, added note to upcoming appointment.  Pended Rx and sent to [facility] for approval.

## 2020-11-03 ENCOUNTER — Ambulatory Visit: Payer: Medicare Other | Admitting: Physician Assistant

## 2020-11-08 ENCOUNTER — Other Ambulatory Visit: Payer: Self-pay | Admitting: *Deleted

## 2020-11-08 DIAGNOSIS — K2100 Gastro-esophageal reflux disease with esophagitis, without bleeding: Secondary | ICD-10-CM

## 2020-11-08 DIAGNOSIS — K219 Gastro-esophageal reflux disease without esophagitis: Secondary | ICD-10-CM

## 2020-11-08 MED ORDER — LANSOPRAZOLE 30 MG PO CPDR
DELAYED_RELEASE_CAPSULE | ORAL | 1 refills | Status: DC
Start: 2020-11-08 — End: 2021-05-10

## 2020-11-08 NOTE — Telephone Encounter (Signed)
[facility] 

## 2020-11-15 ENCOUNTER — Encounter: Payer: Medicare Other | Admitting: Family

## 2020-11-15 ENCOUNTER — Other Ambulatory Visit: Payer: Self-pay

## 2020-11-18 ENCOUNTER — Encounter: Payer: Self-pay | Admitting: Family

## 2020-11-18 ENCOUNTER — Other Ambulatory Visit: Payer: Self-pay

## 2020-11-18 ENCOUNTER — Ambulatory Visit (INDEPENDENT_AMBULATORY_CARE_PROVIDER_SITE_OTHER): Payer: Medicare Other | Admitting: Family

## 2020-11-18 ENCOUNTER — Other Ambulatory Visit: Payer: Self-pay | Admitting: Orthopedic Surgery

## 2020-11-18 DIAGNOSIS — Z0000 Encounter for general adult medical examination without abnormal findings: Secondary | ICD-10-CM | POA: Diagnosis not present

## 2020-11-18 DIAGNOSIS — N3281 Overactive bladder: Secondary | ICD-10-CM

## 2020-11-18 NOTE — Patient Instructions (Signed)
Ariana [name] , Thank you for taking time to come for your Medicare Wellness Visit. I appreciate your ongoing commitment to your health goals. Please review the following plan we discussed and let me know if I can assist you in the future.   Screening recommendations/referrals: Colonoscopy N/A Mammogram N/A  Bone Density up to date  Recommended yearly ophthalmology/optometry visit for glaucoma screening and checkup Recommended yearly dental visit for hygiene and checkup  Vaccinations: Influenza vaccine : Due  Pneumococcal vaccine  Tdap vaccine :Up to date  Shingles vaccine : Due     Advanced directives:No   Conditions/risks identified: Type 2 DM,Advance age female > 61 yrs,Hx smoking,sedentary,Hypertension   Next appointment: 1 year    Preventive Care 38 Years and Older, Female Preventive care refers to lifestyle choices and visits with your health care provider that can promote health and wellness. What does preventive care include? A yearly physical exam. This is also called an annual well check. Dental exams once or twice a year. Routine eye exams. Ask your health care provider how often you should have your eyes checked. Personal lifestyle choices, including: Daily care of your teeth and gums. Regular physical activity. Eating a healthy diet. Avoiding tobacco and drug use. Limiting alcohol use. Practicing safe sex. Taking low-dose aspirin every day. Taking vitamin and mineral supplements as recommended by your health care provider. What happens during an annual well check? The services and screenings done by your health care provider during your annual well check will depend on your age, overall health, lifestyle risk factors, and family history of disease. Counseling  Your health care provider may ask you questions about your: Alcohol use. Tobacco use. Drug use. Emotional well-being. Home and relationship well-being. Sexual activity. Eating habits. History of  falls. Memory and ability to understand (cognition). Work and work Statistician. Reproductive health. Screening  You may have the following tests or measurements: Height, weight, and BMI. Blood pressure. Lipid and cholesterol levels. These may be checked every 5 years, or more frequently if you are over 49 years old. Skin check. Lung cancer screening. You may have this screening every year starting at age 67 if you have a 30-pack-year history of smoking and currently smoke or have quit within the past 15 years. Fecal occult blood test (FOBT) of the stool. You may have this test every year starting at age 97. Flexible sigmoidoscopy or colonoscopy. You may have a sigmoidoscopy every 5 years or a colonoscopy every 10 years starting at age 11. Hepatitis C blood test. Hepatitis B blood test. Sexually transmitted disease (STD) testing. Diabetes screening. This is done by checking your blood sugar (glucose) after you have not eaten for a while (fasting). You may have this done every 1-3 years. Bone density scan. This is done to screen for osteoporosis. You may have this done starting at age 46. Mammogram. This may be done every 1-2 years. Talk to your health care provider about how often you should have regular mammograms. Talk with your health care provider about your test results, treatment options, and if necessary, the need for more tests. Vaccines  Your health care provider may recommend certain vaccines, such as: Influenza vaccine. This is recommended every year. Tetanus, diphtheria, and acellular pertussis (Tdap, Td) vaccine. You may need a Td booster every 10 years. Zoster vaccine. You may need this after age 84. Pneumococcal 13-valent conjugate (PCV13) vaccine. One dose is recommended after age 22. Pneumococcal polysaccharide (PPSV23) vaccine. One dose is recommended after age 34. Talk  to your health care provider about which screenings and vaccines you need and how often you need  them. This information is not intended to replace advice given to you by your health care provider. Make sure you discuss any questions you have with your health care provider. Document Released: 02/26/2015 Document Revised: 10/20/2015 Document Reviewed: 12/01/2014 Elsevier Interactive Patient Education  2017 [place] Prevention in the Home Falls can cause injuries. They can happen to people of all ages. There are many things you can do to make your home safe and to help prevent falls. What can I do on the outside of my home? Regularly fix the edges of walkways and driveways and fix any cracks. Remove anything that might make you trip as you walk through a door, such as a raised step or threshold. Trim any bushes or trees on the path to your home. Use bright outdoor lighting. Clear any walking paths of anything that might make someone trip, such as rocks or tools. Regularly check to see if handrails are loose or broken. Make sure that both sides of any steps have handrails. Any raised decks and porches should have guardrails on the edges. Have any leaves, snow, or ice cleared regularly. Use sand or salt on walking paths during winter. Clean up any spills in your garage right away. This includes oil or grease spills. What can I do in the bathroom? Use night lights. Install grab bars by the toilet and in the tub and shower. Do not use towel bars as grab bars. Use non-skid mats or decals in the tub or shower. If you need to sit down in the shower, use a plastic, non-slip stool. Keep the floor dry. Clean up any water that spills on the floor as soon as it happens. Remove soap buildup in the tub or shower regularly. Attach bath mats securely with double-sided non-slip rug tape. Do not have throw rugs and other things on the floor that can make you trip. What can I do in the bedroom? Use night lights. Make sure that you have a light by your bed that is easy to reach. Do not use  any sheets or blankets that are too big for your bed. They should not hang down onto the floor. Have a firm chair that has side arms. You can use this for support while you get dressed. Do not have throw rugs and other things on the floor that can make you trip. What can I do in the kitchen? Clean up any spills right away. Avoid walking on wet floors. Keep items that you use a lot in easy-to-reach places. If you need to reach something above you, use a strong step stool that has a grab bar. Keep electrical cords out of the way. Do not use floor polish or wax that makes floors slippery. If you must use wax, use non-skid floor wax. Do not have throw rugs and other things on the floor that can make you trip. What can I do with my stairs? Do not leave any items on the stairs. Make sure that there are handrails on both sides of the stairs and use them. Fix handrails that are broken or loose. Make sure that handrails are as long as the stairways. Check any carpeting to make sure that it is firmly attached to the stairs. Fix any carpet that is loose or worn. Avoid having throw rugs at the top or bottom of the stairs. If you do have throw rugs,  attach them to the floor with carpet tape. Make sure that you have a light switch at the top of the stairs and the bottom of the stairs. If you do not have them, ask someone to add them for you. What else can I do to help prevent falls? Wear shoes that: Do not have high heels. Have rubber bottoms. Are comfortable and fit you well. Are closed at the toe. Do not wear sandals. If you use a stepladder: Make sure that it is fully opened. Do not climb a closed stepladder. Make sure that both sides of the stepladder are locked into place. Ask someone to hold it for you, if possible. Clearly mark and make sure that you can see: Any grab bars or handrails. First and last steps. Where the edge of each step is. Use tools that help you move around (mobility aids)  if they are needed. These include: Canes. Walkers. Scooters. Crutches. Turn on the lights when you go into a dark area. Replace any light bulbs as soon as they burn out. Set up your furniture so you have a clear path. Avoid moving your furniture around. If any of your floors are uneven, fix them. If there are any pets around you, be aware of where they are. Review your medicines with your doctor. Some medicines can make you feel dizzy. This can increase your chance of falling. Ask your doctor what other things that you can do to help prevent falls. This information is not intended to replace advice given to you by your health care provider. Make sure you discuss any questions you have with your health care provider. Document Released: 11/26/2008 Document Revised: 07/08/2015 Document Reviewed: 03/06/2014 Elsevier Interactive Patient Education  2017 [employer].

## 2020-11-18 NOTE — Progress Notes (Signed)
No  This service is provided via telemedicine  No vital signs collected/recorded due to the encounter was a telemedicine visit.   Location of patient (ex: home, work):  Home.  Patient consents to a telephone visit:  Yes  Location of the provider (ex: office, home):  [employer].  Name of any referring provider:  Miko Sirico, Nelda Bucks, NP   Names of all persons participating in the telemedicine service and their role in the encounter:  Patient, Caregiver [name], Heriberto Antigua, [place], Zayon Trulson, Webb Silversmith, NP.    Time spent on call: 8 minutes spent on the phone with Medical Assistant.     Subjective:   Ariana [name] is a 87 y.o. female who presents for Medicare Annual (Subsequent) preventive examination.  Review of Systems     Cardiac Risk Factors include: diabetes mellitus;advanced age (>51men, >17 women);smoking/ tobacco exposure;sedentary lifestyle;hypertension     Objective:    Today's Vitals   11/18/20 1545  PainSc: 8    There is no height or weight on file to calculate BMI.  Advanced Directives 11/18/2020 07/09/2020 05/12/2020 05/04/2020 04/28/2020 04/23/2020 04/23/2020  Does Patient Have a Medical Advance Directive? No Yes No No No No No  Type of Advance Directive - [place]  Does patient want to make changes to medical advance directive? - No - Patient declined No - Patient declined No - Patient declined No - Patient declined No - Patient declined -  Copy of [place] in Chart? - - - - - No - copy requested -  Would patient like information on creating a medical advance directive? No - Patient declined - - - - No - Patient declined -  Pre-existing out of facility DNR order (yellow form or pink MOST form) - - - - - - -    Current Medications (verified) Outpatient Encounter Medications as of 11/18/2020  Medication Sig   acetaminophen (TYLENOL) 500 MG tablet Take 1,000 mg by mouth 2 (two) times daily.   ALPRAZolam  (XANAX) 0.25 MG tablet Take 1 tablet (0.25 mg total) by mouth 2 (two) times daily as needed for anxiety.   antiseptic oral rinse (BIOTENE) LIQD 15 mLs by Mouth Rinse route as needed.   Capsaicin-Menthol-Methyl Sal (CAPSAICIN-METHYL SAL-MENTHOL) 0.025-1-12 % CREA Apply 1 application topically 2 (two) times daily.   carboxymethylcellulose (REFRESH PLUS) 0.5 % SOLN Place 1 drop into both eyes in the morning, at noon, in the evening, and at bedtime.   CRANBERRY PO Take 2 tablets by mouth daily. AZO   diltiazem (CARDIZEM CD) 180 MG 24 hr capsule Take 1 capsule (180 mg total) by mouth daily.   DULoxetine (CYMBALTA) 30 MG capsule Take 1 capsule (30 mg total) by mouth daily. Take along with 60 mg to = 90 mg for back pain and depression   DULoxetine (CYMBALTA) 60 MG capsule TAKE 1 CAPSULE BY MOUTH ONCE DAILY. TAKE ALONG WITH 30MG  TO = 90 MG FOR BACK PAIN AND DEPRESSION   furosemide (LASIX) 40 MG tablet Take 1 tablet by mouth once daily   glucose blood (ONETOUCH VERIO) test strip Use to test blood sugar daily. Dx:E08.65   Lancets Micro Thin 33G MISC Use to test blood sugar daily. Dx: E08.65   lansoprazole (PREVACID) 30 MG capsule TAKE 1 CAPSULE BY MOUTH ONCE DAILY AT NOON   linaclotide (LINZESS) 290 MCG CAPS capsule TAKE 1 CAPSULE BY MOUTH EVERY DAY AT BEDTIME   LINZESS 290 MCG CAPS  capsule TAKE 1 CAPSULE BY MOUTH EVERY DAY AT BEDTIME   loratadine (CLARITIN) 10 MG tablet Take 1 tablet (10 mg total) by mouth daily.   memantine (NAMENDA) 10 MG tablet Take 1 tablet by mouth twice daily   mirabegron ER (MYRBETRIQ) 50 MG TB24 tablet Take 1 tablet (50 mg total) by mouth daily.   pilocarpine (SALAGEN) 5 MG tablet Take 1 tablet by mouth twice daily   polyethylene glycol (MIRALAX / GLYCOLAX) packet Take 17 g by mouth daily as needed for mild constipation (constipation). Mix with 8 oz. water or juice   potassium chloride SA (KLOR-CON) 20 MEQ tablet Take 1 tablet by mouth once daily   pregabalin (LYRICA) 100 MG  capsule Take 1 capsule (100 mg total) by mouth 2 (two) times daily.   senna (SENOKOT) 8.6 MG tablet Take 1 tablet (8.6 mg total) by mouth daily.   senna-docusate (SENNA S) 8.6-50 MG tablet Take 1 tablet by mouth daily.   sitaGLIPtin (JANUVIA) 100 MG tablet Take 1 tablet (100 mg total) by mouth daily.   No facility-administered encounter medications on file as of 11/18/2020.    Allergies (verified) Banana, Codeine, Klonopin [clonazepam], Meperidine hcl, Norflex [orphenadrine citrate], Oxycodone-acetaminophen, Propoxyphene hcl, Zoloft [sertraline hcl], Doxycycline, Naproxen, Penicillins, Phenothiazines, Stelazine, Sulfamethoxazole-trimethoprim, Tolectin [tolmetin sodium], and Tramadol   History: Past Medical History:  Diagnosis Date   Abnormality of gait    Adenomatous polyp of colon 2002   43mm   Allergic rhinitis    Anxiety    Anxiety and depression    Chronic back pain    Dementia without behavioral disturbance (HCC)    Depression    Diabetes ([place])    Diverticulosis of colon    Dry eye syndrome    Dysphagia    Dysthymic disorder    Fall    Fibromyalgia    GERD (gastroesophageal reflux disease)    H/O hiatal hernia    History of adverse drug reaction    History of cerebrovascular disease 09/24/2014   History of recurrent UTIs    Hypertension, benign    Irritable bowel syndrome    Low back pain syndrome    Memory loss    Mitral valve prolapse    Paroxysmal A-fib (HCC)    Peripheral neuropathy    "both feet and legs"   Physical deconditioning    Sjogren's syndrome (HCC)    Therapeutic opioid-induced constipation (OIC)    Thyroid nodule    Urinary incontinence    Past Surgical History:  Procedure Laterality Date   ABDOMINAL HYSTERECTOMY  1967   APPENDECTOMY     CARDIAC CATHETERIZATION  02/17/2003   normal L main, LAD free of disease, Cfx free of disease, RCA free of disease (Dr. Loni Muse. Little)   CATARACT EXTRACTION, BILATERAL     CHOLECYSTECTOMY  2000   COLONOSCOPY W/  BIOPSIES     multiple   DENTAL SURGERY     multiple tooth extractions   ESOPHAGOGASTRODUODENOSCOPY (EGD) WITH ESOPHAGEAL DILATION N/A 08/23/2012   Procedure: ESOPHAGOGASTRODUODENOSCOPY (EGD) WITH ESOPHAGEAL DILATION;  Surgeon: Milus Banister, MD;  Location: [facility];  Service: Endoscopy;  Laterality: N/A;   NASAL SEPTUM SURGERY  1980   NM MYOCAR PERF WALL MOTION  2003   persantine - normal static and dynamic study w/apical thinning and presvered LV function, no ischemia   SINUS EXPLORATION     ossifiying fibroma   TEMPOROMANDIBULAR JOINT SURGERY  1986   Dr. Terence Lux   TRANSTHORACIC ECHOCARDIOGRAM  2001   mild  LVH, normal LV   Family History  Problem Relation Age of Onset   Heart disease Father        heart attack   Pneumonia Father    Heart attack Mother    Hypertension Mother    Colon cancer Sister    Kidney disease Daughter    Asthma Daughter    Arthritis Daughter 13       osteo,   Heart disease Son 76       stage 3 CHF(Diastolic /Systolic)   Throat cancer Brother    Hypertension Maternal Grandmother    Social History   Socioeconomic History   Marital status: Widowed    Spouse name: Not on file   Number of children: 2   Years of education: 88   Highest education level: Not on file  Occupational History   Occupation: Retired  Tobacco Use   Smoking status: Former   Smokeless tobacco: Never   Tobacco comments:    Quit at age 64   Vaping Use   Vaping Use: Never used  Substance and Sexual Activity   Alcohol use: No   Drug use: Never   Sexual activity: Never  Other Topics Concern   Not on file  Social History Narrative   Patient lives at home 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    Patient is Widowed.    Patient has 2 children.    Patient is retired.    Former smoker   Alcohol none   Exercise Walk, exercise chair 4 days a week   POA    Walks with cane      Patient drinks about 1-2 cups of hot tea daily.   Patient is right handed.                Social Determinants of Health   Financial Resource Strain: Not on file  Food Insecurity: Not on file  Transportation Needs: Not on file  Physical Activity: Not on file  Stress: Not on file  Social Connections: Not on file    Tobacco Counseling Counseling given: Not Answered Tobacco comments: Quit at age 50    Clinical Intake:  Pre-visit preparation completed: No  Pain : 0-10 Pain Score: 8  Pain Type: Chronic pain Pain Location: Back Pain Orientation: Lower Pain Radiating Towards: none Pain Descriptors / Indicators: Aching Pain Onset: Other (comment) (chronic) Pain Frequency: Intermittent Pain Relieving Factors: Tylenol Effect of Pain on Daily Activities: No.worst with constipation  Pain Relieving Factors: Tylenol  BMI - recorded: 25.76 Nutritional Status: BMI 25 -29 Overweight Nutritional Risks: None Diabetes: Yes CBG done?: Yes (100's -200's) CBG resulted in Enter/ Edit results?: No Did pt. bring in CBG monitor from home?: No  How often do you need to have someone help you when you read instructions, pamphlets, or other written materials from your doctor or pharmacy?: 1 - Never What is the last grade level you completed in school?: 12 Grade  Diabetic? yes   Interpreter Needed?: No  Information entered by :: [place] Kenden Brandt,FNP-C   Activities of Daily Living In your present state of health, do you have any difficulty performing the following activities: 11/18/2020 04/23/2020  Hearing? Y N  Vision? N N  Difficulty concentrating or making decisions? Tempie Donning  Comment remembering -  Walking or climbing stairs? Y Y  Dressing or bathing? N Y  Doing errands, shopping? N Y  Comment Daughter assist -  Conservation officer, nature and eating ? N -  Using the Toilet?  Y -  Comment constipation -  In the past six months, have you accidently leaked urine? Y -  Comment OAB -  Do you have problems with loss of bowel control? N -  Managing your Medications? N -  Comment daughter  assist -  Managing your Finances? N -  Comment Daughter assist -  Housekeeping or managing your Housekeeping? Y -  Comment Has an assitant -  Some recent data might be hidden    Patient Care Team: Jazmynn Pho, Nelda Bucks, NP as PCP - General (Family Medicine) Debara Pickett Nadean Corwin, MD as PCP - Cardiology (Cardiology) Noralee Space, MD as Consulting Physician (Pulmonary Disease) Maisie Fus, MD as Consulting Physician (Obstetrics and Gynecology) Pixie Casino, MD as Consulting Physician (Cardiology) Monna Fam, MD as Consulting Physician (Ophthalmology) Kathrynn Ducking, MD as Consulting Physician (Neurology) Franchot Gallo, MD as Consulting Physician (Urology) Lavonna Monarch, MD as Consulting Physician (Dermatology) Gatha Mayer, MD as Consulting Physician (Gastroenterology) Standley Brooking, LCSW as Social Worker Yvonna Alanis, NP as Nurse Practitioner (Adult Health Nurse Practitioner)  Indicate any recent [place] you may have received from other than [facility] providers in the past year (date may be approximate).     Assessment:   This is a routine wellness examination for Ariana [name].  Hearing/Vision screen Hearing Screening - Comments:: No Hearing Concerns.  Vision Screening - Comments:: Some Vision Concerns. Patient is suppose to wear prescription glasses. Patient last eye exam was 2 month ago and patient has another eye exam in November 2022.  Dietary issues and exercise activities discussed: Current Exercise Habits: The patient does not participate in regular exercise at present, Exercise limited by: None identified   Goals Addressed   None    Depression Screen PHQ 2/9 Scores 11/18/2020 04/01/2020 12/09/2019 11/13/2019 10/02/2019 08/28/2019 06/12/2019  PHQ - 2 Score 0 - 0 0 0 0 0  PHQ- 9 Score - - - - - - -  Exception Documentation - Other- indicate reason in comment box - - - - -    Fall Risk Fall Risk  11/18/2020 07/09/2020 04/01/2020 03/16/2020 12/29/2019  Falls in  the past year? 0 0 1 1 1   Number falls in past yr: 0 0 1 1 1   Injury with Fall? 0 0 0 0 1  Comment - - - - rib fracture  Risk Factor Category  - - - - -  Risk for fall due to : No Fall Risks - - - History of fall(s)  Follow up Falls evaluation completed - - - -    FALL RISK PREVENTION PERTAINING TO THE HOME:  Any stairs in or around the home? No  If so, are there any without handrails? No  Home free of loose throw rugs in walkways, pet beds, electrical cords, etc? No  Adequate lighting in your home to reduce risk of falls? Yes   ASSISTIVE DEVICES UTILIZED TO PREVENT FALLS:  Life alert? No  Use of a cane, walker or w/c? Yes  Grab bars in the bathroom? No  Shower chair or bench in shower? Yes  Elevated toilet seat or a handicapped toilet? No   TIMED UP AND GO:  Was the test performed? No .  Length of time to ambulate 10 feet: N/A sec.   Gait slow and steady with assistive device  Cognitive Function: MMSE - Mini Mental State Exam 11/08/2018 11/05/2017 04/10/2016 10/06/2015 10/06/2015  Orientation to time 4 4 3 3  -  Orientation to Place 4  5 5 5  -  Registration 3 3 3 3 3   Attention/ Calculation 5 5 5 5 5   Recall 0 0 1 3 -  Language- name 2 objects 2 2 2 2  -  Language- repeat 1 1 1 1  -  Language- follow 3 step command 3 3 1 3  -  Language- read & follow direction 1 1 1 1  -  Write a sentence 1 1 0 1 -  Copy design 1 1 1 1  -  Total score 25 26 23 28  -     6CIT Screen 11/18/2020 11/13/2019  What Year? 4 points 4 points  What month? 3 points 3 points  What time? - 0 points  Count back from 20 - 0 points  Months in reverse - 4 points  Repeat phrase - 10 points  Total Score - 21    Immunizations Immunization History  Administered Date(s) Administered   Fluad Quad(high Dose 65+) 11/08/2018, 11/24/2019   Influenza Split 12/26/2010, 01/15/2012   Influenza Whole 12/30/2008, 11/15/2009   Influenza, High Dose Seasonal PF 12/21/2016, 11/05/2017   Influenza,inj,Quad PF,6+ Mos  02/04/2013, 11/03/2013, 12/05/2014, 12/13/2015   Moderna Sars-Covid-2 Vaccination 04/17/2019, 05/15/2019, 12/15/2019   Pneumococcal Conjugate-13 06/12/2014   Pneumococcal Polysaccharide-23 02/15/2009, 12/05/2014   Tdap 12/08/2013   Zoster, Live 01/29/2012    TDAP status: Up to date  Flu Vaccine status: Due, Education has been provided regarding the importance of this vaccine. Advised may receive this vaccine at local pharmacy or Health Dept. Aware to provide a copy of the vaccination record if obtained from local pharmacy or Health Dept. Verbalized acceptance and understanding.  Pneumococcal vaccine status: Due, Education has been provided regarding the importance of this vaccine. Advised may receive this vaccine at local pharmacy or Health Dept. Aware to provide a copy of the vaccination record if obtained from local pharmacy or Health Dept. Verbalized acceptance and understanding.  Covid-19 vaccine status: Information provided on how to obtain vaccines.   Qualifies for Shingles Vaccine? Yes   Zostavax completed Yes   Shingrix Completed?: No.    Education has been provided regarding the importance of this vaccine. Patient has been advised to call insurance company to determine out of pocket expense if they have not yet received this vaccine. Advised may also receive vaccine at local pharmacy or Health Dept. Verbalized acceptance and understanding.  Screening Tests Health Maintenance  Topic Date Due   OPHTHALMOLOGY EXAM  Never done   Zoster Vaccines- Shingrix (1 of 2) Never done   FOOT EXAM  01/28/2020   COVID-19 Vaccine (4 - Booster for Moderna series) 03/08/2020   INFLUENZA VACCINE  09/13/2020   HEMOGLOBIN A1C  09/29/2020   TETANUS/TDAP  12/09/2023   DEXA SCAN  Completed   HPV VACCINES  Aged Out   URINE MICROALBUMIN  Discontinued    Health Maintenance  Health Maintenance Due  Topic Date Due   OPHTHALMOLOGY EXAM  Never done   Zoster Vaccines- Shingrix (1 of 2) Never done    FOOT EXAM  01/28/2020   COVID-19 Vaccine (4 - Booster for Moderna series) 03/08/2020   INFLUENZA VACCINE  09/13/2020   HEMOGLOBIN A1C  09/29/2020    Colorectal cancer screening: No longer required.   Mammogram status: No longer required due to advance age .  Bone Density status: Completed 04/05/2015. Results reflect: Bone density results: OSTEOPENIA. Repeat every 5 years.  Lung Cancer Screening: (Low Dose CT Chest recommended if Age 57-80 years, 30 pack-year currently smoking OR have quit w/in 15years.)  does not qualify.   Lung Cancer Screening Referral: No   Additional Screening:  Hepatitis C Screening: does not qualify; Completed No   Vision Screening: Recommended annual ophthalmology exams for early detection of glaucoma and other disorders of the eye. Is the patient up to date with their annual eye exam?  Yes  Who is the provider or what is the name of the office in which the patient attends annual eye exams? Does not recall  If pt is not established with a provider, would they like to be referred to a provider to establish care? No .   Dental Screening: Recommended annual dental exams for proper oral hygiene  Community Resource Referral / Chronic Care Management: CRR required this visit?  No   CCM required this visit?  No      Plan:     I have personally reviewed and noted the following in the patient's chart:   Medical and social history Use of alcohol, tobacco or illicit drugs  Current medications and supplements including opioid prescriptions.  Functional ability and status Nutritional status Physical activity Advanced directives List of other physicians Hospitalizations, surgeries, and ER visits in previous 12 months Vitals Screenings to include cognitive, depression, and falls Referrals and appointments  In addition, I have reviewed and discussed with patient certain preventive protocols, quality metrics, and best practice recommendations. A written  personalized care plan for preventive services as well as general preventive health recommendations were provided to patient.     Sandrea Hughs, NP   11/18/2020   Nurse Notes: Advised to get shingrix vaccine and COVID-19  at her Pharmacy.she will get her Influenza vaccine on her upcoming appointment 11/29/2020.

## 2020-11-19 ENCOUNTER — Telehealth: Payer: Self-pay

## 2020-11-19 NOTE — Telephone Encounter (Addendum)
Patient's daughter ed stating she believes the patient may hav an UTI- Symptoms: burning with foul ordor. Unable to make it in today to available spot. She prefers to speak with someone regarding billing before changing/making any apnts. Transferred to L. Sabra Heck, telephone encounter to L. Miller.

## 2020-11-22 ENCOUNTER — Other Ambulatory Visit: Payer: Self-pay

## 2020-11-22 ENCOUNTER — Ambulatory Visit (INDEPENDENT_AMBULATORY_CARE_PROVIDER_SITE_OTHER): Payer: Medicare Other | Admitting: Family

## 2020-11-22 ENCOUNTER — Encounter: Payer: Self-pay | Admitting: Family

## 2020-11-22 VITALS — BP 116/62 | HR 74 | Temp 98.2°F | Resp 16 | Ht 63.0 in | Wt 151.8 lb

## 2020-11-22 DIAGNOSIS — R300 Dysuria: Secondary | ICD-10-CM | POA: Diagnosis not present

## 2020-11-22 DIAGNOSIS — R350 Frequency of micturition: Secondary | ICD-10-CM | POA: Diagnosis not present

## 2020-11-22 LAB — POCT URINALYSIS DIPSTICK
Bilirubin, UA: NEGATIVE
Glucose, UA: NEGATIVE
Ketones, UA: POSITIVE
Nitrite, UA: POSITIVE
Protein, UA: NEGATIVE
Spec Grav, UA: 1.025 (ref 1.010–1.025)
Urobilinogen, UA: NEGATIVE E.U./dL — AB
pH, UA: 5 (ref 5.0–8.0)

## 2020-11-22 MED ORDER — SACCHAROMYCES BOULARDII 250 MG PO CAPS: 250.0000 mg | ORAL_CAPSULE | Freq: Two times a day (BID) | ORAL | 0 refills | Status: AC

## 2020-11-22 MED ORDER — NITROFURANTOIN MONOHYD MACRO 100 MG PO CAPS
100.0000 mg | ORAL_CAPSULE | Freq: Two times a day (BID) | ORAL | 0 refills | Status: AC
Start: 2020-11-22 — End: 2020-11-29

## 2020-11-22 NOTE — Patient Instructions (Signed)
-   increase water intake urine was cloudy today   - Take Nitrofurantoin 100 mg capsule one by mouth twice daily for 7 days.will call you with urine cultures in case we need to change the antibiotics.

## 2020-11-22 NOTE — Progress Notes (Signed)
Provider: [name] FNP-C  Terrace Fontanilla, Nelda Bucks, NP  Patient Care Team: Avyaan Summer, Nelda Bucks, NP as PCP - General (Family Medicine) Pixie Casino, MD as PCP - Cardiology (Cardiology) Noralee Space, MD as Consulting Physician (Pulmonary Disease) Maisie Fus, MD as Consulting Physician (Obstetrics and Gynecology) Debara Pickett Nadean Corwin, MD as Consulting Physician (Cardiology) Monna Fam, MD as Consulting Physician (Ophthalmology) Kathrynn Ducking, MD as Consulting Physician (Neurology) Franchot Gallo, MD as Consulting Physician (Urology) Lavonna Monarch, MD as Consulting Physician (Dermatology) Gatha Mayer, MD as Consulting Physician (Gastroenterology) Estill Dooms as Social Worker Yvonna Alanis, NP as Nurse Practitioner (Adult Health Nurse Practitioner)  Extended Emergency Contact Information Primary Emergency Contact: Era Skeen Address: [address]          [place], [place] [address] Johnnette Litter of [place] Phone: [phone] Relation: Daughter Secondary Emergency Contact: Arvin Collard [place] of [place] Mobile Phone: [phone] Relation: Other  Code Status:  Full Code  Goals of care: Advanced Directive information Advanced Directives 11/22/2020  Does Patient Have a Medical Advance Directive? No  Type of Advance Directive -  Does patient want to make changes to medical advance directive? -  Copy of [place] in Chart? -  Would patient like information on creating a medical advance directive? No - Patient declined  Pre-existing out of facility DNR order (yellow form or pink MOST form) -     Chief Complaint  Patient presents with   Acute Visit    Complains of possible UTI    HPI:  Pt is a 87 y.o. female seen today for an acute visit for evaluation of dysuria and urinary frequency x several days.complains of lower abdominal pain.states appetite is good. denies any  fever,chills,nausea,vomiting,abdominal pain,flank pain or urgency.    Past Medical History:  Diagnosis Date   Abnormality of gait    Adenomatous polyp of colon 2002   30mm   Allergic rhinitis    Anxiety    Anxiety and depression    Chronic back pain    Dementia without behavioral disturbance (HCC)    Depression    Diabetes ([place])    Diverticulosis of colon    Dry eye syndrome    Dysphagia    Dysthymic disorder    Fall    Fibromyalgia    GERD (gastroesophageal reflux disease)    H/O hiatal hernia    History of adverse drug reaction    History of cerebrovascular disease 09/24/2014   History of recurrent UTIs    Hypertension, benign    Irritable bowel syndrome    Low back pain syndrome    Memory loss    Mitral valve prolapse    Paroxysmal A-fib (HCC)    Peripheral neuropathy    "both feet and legs"   Physical deconditioning    Sjogren's syndrome (HCC)    Therapeutic opioid-induced constipation (OIC)    Thyroid nodule    Urinary incontinence    Past Surgical History:  Procedure Laterality Date   ABDOMINAL HYSTERECTOMY  1967   APPENDECTOMY     CARDIAC CATHETERIZATION  02/17/2003   normal L main, LAD free of disease, Cfx free of disease, RCA free of disease (Dr. Loni Muse. [place])   CATARACT EXTRACTION, BILATERAL     CHOLECYSTECTOMY  2000   COLONOSCOPY W/ BIOPSIES     multiple   DENTAL SURGERY     multiple tooth extractions   ESOPHAGOGASTRODUODENOSCOPY (EGD) WITH ESOPHAGEAL DILATION N/A 08/23/2012  Procedure: ESOPHAGOGASTRODUODENOSCOPY (EGD) WITH ESOPHAGEAL DILATION;  Surgeon: Milus Banister, MD;  Location: [facility];  Service: Endoscopy;  Laterality: N/A;   NASAL SEPTUM SURGERY  1980   NM MYOCAR PERF WALL MOTION  2003   persantine - normal static and dynamic study w/apical thinning and presvered LV function, no ischemia   SINUS EXPLORATION     ossifiying fibroma   TEMPOROMANDIBULAR JOINT SURGERY  1986   Dr. Terence Lux   TRANSTHORACIC ECHOCARDIOGRAM  2001   mild LVH,  normal LV    Allergies  Allergen Reactions   Banana Nausea And Vomiting   Codeine Nausea Only    unless given with Phenergan   Klonopin [Clonazepam] Other (See Comments)    Causes hallucination    Meperidine Hcl Nausea Only    unless given with Phenergan   Norflex [Orphenadrine Citrate] Nausea Only    Unless given with Phenergan   Oxycodone-Acetaminophen Nausea Only    unless given with phenergan   Propoxyphene Hcl Nausea Only    unless given with phenergan   Zoloft [Sertraline Hcl] Other (See Comments)    Caused lethargy   Doxycycline Other (See Comments)    Unknown reaction   Naproxen Other (See Comments)    Unknown reaction   Penicillins Other (See Comments)    Unknown reaction Has patient had a PCN reaction causing immediate rash, facial/tongue/throat swelling, SOB or lightheadedness with hypotension: Unknown Has patient had a PCN reaction causing severe rash involving mucus membranes or skin necrosis: Unknown Has patient had a PCN reaction that required hospitalization: pt was in the hospital at time of reaction Has patient had a PCN reaction occurring within the last 10 years: Unknown If all of the above answers are "NO", then may proceed with Cephalos   Phenothiazines Other (See Comments)    Unknown reaction   Stelazine Other (See Comments)    Unknown reaction   Sulfamethoxazole-Trimethoprim Other (See Comments)    Unknown reaction   Tolectin [Tolmetin Sodium] Other (See Comments)    Unknown reaction   Tramadol Other (See Comments)    Unknown reaction    Outpatient Encounter Medications as of 11/22/2020  Medication Sig   acetaminophen (TYLENOL) 500 MG tablet Take 1,000 mg by mouth 2 (two) times daily.   ALPRAZolam (XANAX) 0.25 MG tablet Take 1 tablet (0.25 mg total) by mouth 2 (two) times daily as needed for anxiety.   antiseptic oral rinse (BIOTENE) LIQD 15 mLs by Mouth Rinse route as needed.   Capsaicin-Menthol-Methyl Sal (CAPSAICIN-METHYL SAL-MENTHOL)  0.025-1-12 % CREA Apply 1 application topically 2 (two) times daily.   carboxymethylcellulose (REFRESH PLUS) 0.5 % SOLN Place 1 drop into both eyes in the morning, at noon, in the evening, and at bedtime.   CRANBERRY PO Take 2 tablets by mouth daily. AZO   diltiazem (CARDIZEM CD) 180 MG 24 hr capsule Take 1 capsule (180 mg total) by mouth daily.   DULoxetine (CYMBALTA) 30 MG capsule Take 1 capsule (30 mg total) by mouth daily. Take along with 60 mg to = 90 mg for back pain and depression   DULoxetine (CYMBALTA) 60 MG capsule TAKE 1 CAPSULE BY MOUTH ONCE DAILY. TAKE ALONG WITH 30MG  TO = 90 MG FOR BACK PAIN AND DEPRESSION   furosemide (LASIX) 40 MG tablet Take 1 tablet by mouth once daily   glucose blood (ONETOUCH VERIO) test strip Use to test blood sugar daily. Dx:E08.65   Lancets Micro Thin 33G MISC Use to test blood sugar daily. Dx: E36.62  lansoprazole (PREVACID) 30 MG capsule TAKE 1 CAPSULE BY MOUTH ONCE DAILY AT NOON   linaclotide (LINZESS) 290 MCG CAPS capsule TAKE 1 CAPSULE BY MOUTH EVERY DAY AT BEDTIME   LINZESS 290 MCG CAPS capsule TAKE 1 CAPSULE BY MOUTH EVERY DAY AT BEDTIME   loratadine (CLARITIN) 10 MG tablet Take 1 tablet (10 mg total) by mouth daily.   memantine (NAMENDA) 10 MG tablet Take 1 tablet by mouth twice daily   mirabegron ER (MYRBETRIQ) 50 MG TB24 tablet Take 1 tablet by mouth once daily   pilocarpine (SALAGEN) 5 MG tablet Take 1 tablet by mouth twice daily   polyethylene glycol (MIRALAX / GLYCOLAX) packet Take 17 g by mouth daily as needed for mild constipation (constipation). Mix with 8 oz. water or juice   potassium chloride SA (KLOR-CON) 20 MEQ tablet Take 1 tablet by mouth once daily   pregabalin (LYRICA) 100 MG capsule Take 1 capsule (100 mg total) by mouth 2 (two) times daily.   senna (SENOKOT) 8.6 MG tablet Take 1 tablet (8.6 mg total) by mouth daily.   senna-docusate (SENNA S) 8.6-50 MG tablet Take 1 tablet by mouth daily.   sitaGLIPtin (JANUVIA) 100 MG tablet  Take 1 tablet (100 mg total) by mouth daily.   No facility-administered encounter medications on file as of 11/22/2020.    Review of Systems  Constitutional:  Negative for appetite change, chills, fatigue, fever and unexpected weight change.  Respiratory:  Negative for cough, chest tightness, shortness of breath and wheezing.   Cardiovascular:  Negative for chest pain, palpitations and leg swelling.  Gastrointestinal:  Negative for abdominal distention, constipation, diarrhea, nausea and vomiting.       Lower abdominal pain   Genitourinary:  Positive for difficulty urinating, dysuria and frequency. Negative for flank pain and urgency.  Skin:  Negative for color change, pallor and rash.  Neurological:  Negative for dizziness, weakness, light-headedness, numbness and headaches.  Psychiatric/Behavioral:  Negative for agitation, behavioral problems, confusion, hallucinations and sleep disturbance. The patient is not nervous/anxious.    Immunization History  Administered Date(s) Administered   Fluad Quad(high Dose 65+) 11/08/2018, 11/24/2019   Influenza Split 12/26/2010, 01/15/2012   Influenza Whole 12/30/2008, 11/15/2009   Influenza, High Dose Seasonal PF 12/21/2016, 11/05/2017   Influenza,inj,Quad PF,6+ Mos 02/04/2013, 11/03/2013, 12/05/2014, 12/13/2015   Moderna Sars-Covid-2 Vaccination 04/17/2019, 05/15/2019, 12/15/2019   Pneumococcal Conjugate-13 06/12/2014   Pneumococcal Polysaccharide-23 02/15/2009, 12/05/2014   Tdap 12/08/2013   Zoster, Live 01/29/2012   Pertinent  Health Maintenance Due  Topic Date Due   OPHTHALMOLOGY EXAM  Never done   FOOT EXAM  01/28/2020   INFLUENZA VACCINE  09/13/2020   HEMOGLOBIN A1C  09/29/2020   DEXA SCAN  Completed   URINE MICROALBUMIN  Discontinued   Fall Risk  11/22/2020 11/18/2020 07/09/2020 04/01/2020 03/16/2020  Falls in the past year? 0 0 0 1 1  Number falls in past yr: 0 0 0 1 1  Injury with Fall? 0 0 0 0 0  Comment - - - - -  Risk Factor  Category  - - - - -  Risk for fall due to : No Fall Risks No Fall Risks - - -  Follow up Falls evaluation completed Falls evaluation completed - - -   Functional Status Survey:    Vitals:   11/22/20 1515  BP: 116/62  Pulse: 74  Resp: 16  Temp: 98.2 F (36.8 C)  SpO2: 96%  Weight: 151 lb 12.8 oz (68.9 kg)  Height:  5\' 3"  (1.6 m)   Body mass index is 26.89 kg/m. Physical Exam Vitals reviewed.  Constitutional:      General: She is not in acute distress.    Appearance: Normal appearance. She is normal weight. She is not ill-appearing or diaphoretic.  Eyes:     General: No scleral icterus.       Right eye: No discharge.        Left eye: No discharge.     Conjunctiva/sclera: Conjunctivae normal.     Pupils: Pupils are equal, round, and reactive to light.  Cardiovascular:     Rate and Rhythm: Normal rate and regular rhythm.     Pulses: Normal pulses.     Heart sounds: Normal heart sounds. No murmur heard.   No friction rub. No gallop.  Pulmonary:     Effort: Pulmonary effort is normal. No respiratory distress.     Breath sounds: Normal breath sounds. No wheezing, rhonchi or rales.  Chest:     Chest wall: No tenderness.  Abdominal:     General: Bowel sounds are normal. There is no distension.     Palpations: Abdomen is soft. There is no mass.     Tenderness: There is no right CVA tenderness, left CVA tenderness, guarding or rebound.     Comments: Suprapubic tenderness   Musculoskeletal:        General: No swelling or tenderness.     Right lower leg: No edema.     Left lower leg: No edema.     Comments: Unsteady gait   Skin:    General: Skin is warm and dry.     Coloration: Skin is not pale.     Findings: No bruising, erythema, lesion or rash.  Neurological:     Mental Status: She is alert. Mental status is at baseline.     Motor: No weakness.     Gait: Gait abnormal.  Psychiatric:        Mood and Affect: Mood normal.        Speech: Speech normal.        Behavior:  Behavior normal.        Thought Content: Thought content normal.        Judgment: Judgment normal.    Labs reviewed: Recent Labs    04/24/20 0547 04/25/20 0628 05/20/20 1550  NA 134* 136 138  K 4.0 3.8 4.3  CL 98 102 103  CO2 25 25 23   GLUCOSE 147* 161* 93  BUN 21 12 16   CREATININE 0.85 0.63 0.94*  CALCIUM 9.3 9.6 9.7   No results for input(s): AST, ALT, ALKPHOS, BILITOT, PROT, ALBUMIN in the last 8760 hours. Recent Labs    04/01/20 1614 04/23/20 1051 04/24/20 0547 04/25/20 0628 05/20/20 1550  WBC 8.8   < > 7.5 7.0 7.1  NEUTROABS 5,878  --   --   --  4,487  HGB 13.3   < > 13.1 13.5 12.8  HCT 39.9   < > 40.0 41.0 39.2  MCV 93.7   < > 94.8 94.0 94.5  PLT 242   < > 241 246 206   < > = values in this interval not displayed.   Lab Results  Component Value Date   TSH 1.35 09/04/2017   Lab Results  Component Value Date   HGBA1C 6.8 (H) 04/01/2020   Lab Results  Component Value Date   CHOL 183 09/06/2017   HDL 63 09/06/2017   LDLCALC 99 09/06/2017   LDLDIRECT  65.7 07/08/2010   TRIG 113 09/06/2017   CHOLHDL 2.9 09/06/2017    Significant Diagnostic Results in last 30 days:  No results found.  Assessment/Plan  1. Dysuria Afebrile. - POC Urinalysis Dipstick indicates urine cloudy,positive for Ketones,trace blood,Nitrites and large 3 + white blood cell.  Will start on Nitrofurantoin as below.discussed side effects.advised to take with probiotics.made aware might need to switch antibiotics if cultures indicate different antibiotics. - Urine Culture - nitrofurantoin, macrocrystal-monohydrate, (MACROBID) 100 MG capsule; Take 1 capsule (100 mg total) by mouth 2 (two) times daily for 7 days.  Dispense: 14 capsule; Refill: 0 - saccharomyces boulardii (FLORASTOR) 250 MG capsule; Take 1 capsule (250 mg total) by mouth 2 (two) times daily for 10 days.  Dispense: 20 capsule; Refill: 0   2. Urinary frequency Afebrile - POC Urinalysis Dipstick as above.will start on  Nitrofurantoin as below - Urine Culture - nitrofurantoin, macrocrystal-monohydrate, (MACROBID) 100 MG capsule; Take 1 capsule (100 mg total) by mouth 2 (two) times daily for 7 days.  Dispense: 14 capsule; Refill: 0 - saccharomyces boulardii (FLORASTOR) 250 MG capsule; Take 1 capsule (250 mg total) by mouth 2 (two) times daily for 10 days.  Dispense: 20 capsule; Refill: 0   Family/ staff Communication: Reviewed plan of care with patient verbalized understanding  Labs/tests ordered:  - POC Urinalysis Dipstick  - Urine Culture  Next Appointment: As needed if symptoms worsen or fail to improve    Sandrea Hughs, NP

## 2020-11-24 LAB — URINE CULTURE
MICRO NUMBER:: 12484738
SPECIMEN QUALITY:: ADEQUATE

## 2020-11-25 ENCOUNTER — Other Ambulatory Visit: Payer: Self-pay | Admitting: Family

## 2020-11-25 NOTE — Telephone Encounter (Signed)
Patient has request refill on medication "Alprazolam". Patient last contract was 02/26/2019. Patient last refill on medication was 10/27/2020. Patient has upcoming appointment with PCP Ngetich, Nelda Bucks, NP on 11/29/2020. Already has note in appointment stating to update contract. Medication pend and sent to PCP Ngetich, Nelda Bucks, NP . Please Advise.

## 2020-11-29 ENCOUNTER — Ambulatory Visit: Payer: Medicare Other | Admitting: Family

## 2020-12-03 ENCOUNTER — Ambulatory Visit: Payer: Medicare Other | Admitting: Physician Assistant

## 2020-12-09 ENCOUNTER — Other Ambulatory Visit: Payer: Self-pay

## 2020-12-09 ENCOUNTER — Encounter: Payer: Self-pay | Admitting: Family

## 2020-12-09 ENCOUNTER — Other Ambulatory Visit: Payer: Self-pay | Admitting: Family

## 2020-12-09 ENCOUNTER — Ambulatory Visit (INDEPENDENT_AMBULATORY_CARE_PROVIDER_SITE_OTHER): Payer: Medicare Other | Admitting: Family

## 2020-12-09 VITALS — BP 136/82 | HR 112 | Temp 97.5°F | Resp 16 | Ht 63.0 in | Wt 150.6 lb

## 2020-12-09 DIAGNOSIS — E1140 Type 2 diabetes mellitus with diabetic neuropathy, unspecified: Secondary | ICD-10-CM

## 2020-12-09 DIAGNOSIS — I10 Essential (primary) hypertension: Secondary | ICD-10-CM

## 2020-12-09 NOTE — Progress Notes (Signed)
Provider: [name] FNP-C  Calhoun Reichardt, Nelda Bucks, NP  Patient Care Team: Jerami Tammen, Nelda Bucks, NP as PCP - General (Family Medicine) Pixie Casino, MD as PCP - Cardiology (Cardiology) Noralee Space, MD as Consulting Physician (Pulmonary Disease) Maisie Fus, MD as Consulting Physician (Obstetrics and Gynecology) Debara Pickett Nadean Corwin, MD as Consulting Physician (Cardiology) Monna Fam, MD as Consulting Physician (Ophthalmology) Kathrynn Ducking, MD as Consulting Physician (Neurology) Franchot Gallo, MD as Consulting Physician (Urology) Lavonna Monarch, MD as Consulting Physician (Dermatology) Gatha Mayer, MD as Consulting Physician (Gastroenterology) Estill Dooms as Social Worker Yvonna Alanis, NP as Nurse Practitioner (Adult Health Nurse Practitioner)  Extended Emergency Contact Information Primary Emergency Contact: Era Skeen Address: [address]          [place], [place] [address] Johnnette Litter of [place] Phone: [phone] Relation: Daughter Secondary Emergency Contact: Arvin Collard [place] of [place] Mobile Phone: [phone] Relation: Other  Code Status: Full Code  Goals of care: Advanced Directive information Advanced Directives 12/09/2020  Does Patient Have a Medical Advance Directive? No  Type of Advance Directive -  Does patient want to make changes to medical advance directive? -  Copy of [place] in Chart? -  Would patient like information on creating a medical advance directive? No - Patient declined  Pre-existing out of facility DNR order (yellow form or pink MOST form) -     Chief Complaint  Patient presents with   Medical Management of Chronic Issues    Hemoglobin A1C check/ Update contract.    HPI:  Pt is a 87 y.o. female seen today to check lab work.Has a medical history of controlled type 2 diabetes,Hypertension among others. She denies any signs of  hypo/hyperglycemia. Also need to update her Narcotic use contract.Still requires alprazolam.Still sad concerning her husband who died from a fall from a ladder at work.  Verbalized concerns about her care giver during the visit spend most time complaining about her current care giver states does not assist her with A DL's except meals.states has been dressing self and bathing.Kept repeating similar concerns over and over during the visit.I've discussed with her to address concerns with care giver's agency and also her daughter who is the POA. She declined for care giver to come to the exam room stayed in the Lobby until at the end of the visit when she came and walked with patient to the car.  Denies any concerns of abuse just does not get along well with not helping her.    Past Medical History:  Diagnosis Date   Abnormality of gait    Adenomatous polyp of colon 2002   40m   Allergic rhinitis    Anxiety    Anxiety and depression    Chronic back pain    Dementia without behavioral disturbance (HCC)    Depression    Diabetes ([place]    Diverticulosis of colon    Dry eye syndrome    Dysphagia    Dysthymic disorder    Fall    Fibromyalgia    GERD (gastroesophageal reflux disease)    H/O hiatal hernia    History of adverse drug reaction    History of cerebrovascular disease 09/24/2014   History of recurrent UTIs    Hypertension, benign    Irritable bowel syndrome    Low back pain syndrome    Memory loss    Mitral valve prolapse    Paroxysmal A-fib ([place]  Peripheral neuropathy    "both feet and legs"   Physical deconditioning    Sjogren's syndrome (HCC)    Therapeutic opioid-induced constipation (OIC)    Thyroid nodule    Urinary incontinence    Past Surgical History:  Procedure Laterality Date   ABDOMINAL HYSTERECTOMY  1967   APPENDECTOMY     CARDIAC CATHETERIZATION  02/17/2003   normal L main, LAD free of disease, Cfx free of disease, RCA free of disease (Dr. Loni Muse. Little)    CATARACT EXTRACTION, BILATERAL     CHOLECYSTECTOMY  2000   COLONOSCOPY W/ BIOPSIES     multiple   DENTAL SURGERY     multiple tooth extractions   ESOPHAGOGASTRODUODENOSCOPY (EGD) WITH ESOPHAGEAL DILATION N/A 08/23/2012   Procedure: ESOPHAGOGASTRODUODENOSCOPY (EGD) WITH ESOPHAGEAL DILATION;  Surgeon: Milus Banister, MD;  Location: [facility];  Service: Endoscopy;  Laterality: N/A;   NASAL SEPTUM SURGERY  1980   NM MYOCAR PERF WALL MOTION  2003   persantine - normal static and dynamic study w/apical thinning and presvered LV function, no ischemia   SINUS EXPLORATION     ossifiying fibroma   TEMPOROMANDIBULAR JOINT SURGERY  1986   Dr. Terence Lux   TRANSTHORACIC ECHOCARDIOGRAM  2001   mild LVH, normal LV    Allergies  Allergen Reactions   Banana Nausea And Vomiting   Codeine Nausea Only    unless given with Phenergan   Klonopin [Clonazepam] Other (See Comments)    Causes hallucination    Meperidine Hcl Nausea Only    unless given with Phenergan   Norflex [Orphenadrine Citrate] Nausea Only    Unless given with Phenergan   Oxycodone-Acetaminophen Nausea Only    unless given with phenergan   Propoxyphene Hcl Nausea Only    unless given with phenergan   Zoloft [Sertraline Hcl] Other (See Comments)    Caused lethargy   Doxycycline Other (See Comments)    Unknown reaction   Naproxen Other (See Comments)    Unknown reaction   Penicillins Other (See Comments)    Unknown reaction Has patient had a PCN reaction causing immediate rash, facial/tongue/throat swelling, SOB or lightheadedness with hypotension: Unknown Has patient had a PCN reaction causing severe rash involving mucus membranes or skin necrosis: Unknown Has patient had a PCN reaction that required hospitalization: pt was in the hospital at time of reaction Has patient had a PCN reaction occurring within the last 10 years: Unknown If all of the above answers are "NO", then may proceed with Cephalos   Phenothiazines Other  (See Comments)    Unknown reaction   Stelazine Other (See Comments)    Unknown reaction   Sulfamethoxazole-Trimethoprim Other (See Comments)    Unknown reaction   Tolectin [Tolmetin Sodium] Other (See Comments)    Unknown reaction   Tramadol Other (See Comments)    Unknown reaction    Outpatient Encounter Medications as of 12/09/2020  Medication Sig   acetaminophen (TYLENOL) 500 MG tablet Take 1,000 mg by mouth 2 (two) times daily.   ALPRAZolam (XANAX) 0.25 MG tablet Take 1 tablet by mouth twice daily as needed for anxiety   antiseptic oral rinse (BIOTENE) LIQD 15 mLs by Mouth Rinse route as needed.   Capsaicin-Menthol-Methyl Sal (CAPSAICIN-METHYL SAL-MENTHOL) 0.025-1-12 % CREA Apply 1 application topically 2 (two) times daily.   carboxymethylcellulose (REFRESH PLUS) 0.5 % SOLN Place 1 drop into both eyes in the morning, at noon, in the evening, and at bedtime.   CRANBERRY PO Take 2 tablets by mouth daily.  AZO   diltiazem (CARDIZEM CD) 180 MG 24 hr capsule Take 1 capsule (180 mg total) by mouth daily.   DULoxetine (CYMBALTA) 30 MG capsule Take 1 capsule (30 mg total) by mouth daily. Take along with 60 mg to = 90 mg for back pain and depression   DULoxetine (CYMBALTA) 60 MG capsule TAKE 1 CAPSULE BY MOUTH ONCE DAILY. TAKE ALONG WITH 30MG TO = 90 MG FOR BACK PAIN AND DEPRESSION   furosemide (LASIX) 40 MG tablet Take 1 tablet by mouth once daily   glucose blood (ONETOUCH VERIO) test strip Use to test blood sugar daily. Dx:E08.65   Lancets Micro Thin 33G MISC Use to test blood sugar daily. Dx: E08.65   lansoprazole (PREVACID) 30 MG capsule TAKE 1 CAPSULE BY MOUTH ONCE DAILY AT NOON   linaclotide (LINZESS) 290 MCG CAPS capsule TAKE 1 CAPSULE BY MOUTH EVERY DAY AT BEDTIME   LINZESS 290 MCG CAPS capsule TAKE 1 CAPSULE BY MOUTH EVERY DAY AT BEDTIME   loratadine (CLARITIN) 10 MG tablet Take 1 tablet (10 mg total) by mouth daily.   memantine (NAMENDA) 10 MG tablet Take 1 tablet by mouth twice  daily   mirabegron ER (MYRBETRIQ) 50 MG TB24 tablet Take 1 tablet by mouth once daily   pilocarpine (SALAGEN) 5 MG tablet Take 1 tablet by mouth twice daily   polyethylene glycol (MIRALAX / GLYCOLAX) packet Take 17 g by mouth daily as needed for mild constipation (constipation). Mix with 8 oz. water or juice   potassium chloride SA (KLOR-CON) 20 MEQ tablet Take 1 tablet by mouth once daily   pregabalin (LYRICA) 100 MG capsule Take 1 capsule (100 mg total) by mouth 2 (two) times daily.   senna (SENOKOT) 8.6 MG tablet Take 1 tablet (8.6 mg total) by mouth daily.   senna-docusate (SENNA S) 8.6-50 MG tablet Take 1 tablet by mouth daily.   sitaGLIPtin (JANUVIA) 100 MG tablet Take 1 tablet (100 mg total) by mouth daily.   No facility-administered encounter medications on file as of 12/09/2020.    Review of Systems  Constitutional:  Negative for appetite change, chills, fatigue, fever and unexpected weight change.  HENT:  Negative for congestion, dental problem, ear discharge, ear pain, facial swelling, hearing loss, nosebleeds, postnasal drip, rhinorrhea, sinus pressure, sinus pain, sneezing, sore throat, tinnitus and trouble swallowing.   Eyes:  Negative for pain, discharge, redness, itching and visual disturbance.  Respiratory:  Negative for cough, chest tightness, shortness of breath and wheezing.   Cardiovascular:  Negative for chest pain, palpitations and leg swelling.  Gastrointestinal:  Negative for abdominal distention, abdominal pain, blood in stool, constipation, diarrhea, nausea and vomiting.  Endocrine: Negative for cold intolerance, heat intolerance, polydipsia, polyphagia and polyuria.  Genitourinary:  Negative for difficulty urinating, dysuria, flank pain and urgency.  Musculoskeletal:  Positive for arthralgias, back pain and gait problem. Negative for joint swelling, myalgias, neck pain and neck stiffness.  Skin:  Negative for color change, pallor, rash and wound.  Neurological:   Negative for dizziness, syncope, speech difficulty, weakness, light-headedness, numbness and headaches.  Hematological:  Does not bruise/bleed easily.  Psychiatric/Behavioral:  Negative for agitation, behavioral problems, confusion, hallucinations, self-injury, sleep disturbance and suicidal ideas. The patient is nervous/anxious.    Immunization History  Administered Date(s) Administered   Fluad Quad(high Dose 65+) 11/08/2018, 11/24/2019   Influenza Split 12/26/2010, 01/15/2012   Influenza Whole 12/30/2008, 11/15/2009   Influenza, High Dose Seasonal PF 12/21/2016, 11/05/2017   Influenza,inj,Quad PF,6+ Mos 02/04/2013, 11/03/2013, 12/05/2014,   12/13/2015   Moderna Sars-Covid-2 Vaccination 04/17/2019, 05/15/2019, 12/15/2019   Pneumococcal Conjugate-13 06/12/2014   Pneumococcal Polysaccharide-23 02/15/2009, 12/05/2014   Tdap 12/08/2013   Zoster, Live 01/29/2012   Pertinent  Health Maintenance Due  Topic Date Due   OPHTHALMOLOGY EXAM  Never done   FOOT EXAM  01/28/2020   INFLUENZA VACCINE  09/13/2020   HEMOGLOBIN A1C  09/29/2020   DEXA SCAN  Completed   URINE MICROALBUMIN  Discontinued   Fall Risk 04/27/2020 07/09/2020 11/18/2020 11/22/2020 12/09/2020  Falls in the past year? - 0 0 0 0  Was there an injury with Fall? - 0 0 0 0  Was there an injury with Fall? - - - - -  Fall Risk Category Calculator - 0 0 0 0  Fall Risk Category - Low Low Low Low  Patient Fall Risk Level High fall risk Low fall risk Low fall risk Low fall risk Low fall risk  Patient at Risk for Falls Due to - - No Fall Risks No Fall Risks No Fall Risks  Fall risk Follow up - - Falls evaluation completed Falls evaluation completed Falls evaluation completed   Functional Status Survey:    Vitals:   12/09/20 1427  BP: 136/82  Pulse: (!) 112  Resp: 16  Temp: (!) 97.5 F (36.4 C)  SpO2: 97%  Weight: 150 lb 9.6 oz (68.3 kg)  Height: 5' 3" (1.6 m)   Body mass index is 26.68 kg/m. Physical Exam Vitals reviewed.   Constitutional:      General: She is not in acute distress.    Appearance: Normal appearance. She is normal weight. She is not ill-appearing or diaphoretic.  HENT:     Head: Normocephalic.     Nose: Nose normal. No congestion or rhinorrhea.     Mouth/Throat:     Mouth: Mucous membranes are moist.     Pharynx: Oropharynx is clear. No oropharyngeal exudate or posterior oropharyngeal erythema.  Eyes:     General: No scleral icterus.       Right eye: No discharge.        Left eye: No discharge.     Extraocular Movements: Extraocular movements intact.     Conjunctiva/sclera: Conjunctivae normal.     Pupils: Pupils are equal, round, and reactive to light.  Neck:     Vascular: No carotid bruit.     Comments: Unsteady gait  Cardiovascular:     Rate and Rhythm: Normal rate and regular rhythm.     Pulses: Normal pulses.     Heart sounds: Normal heart sounds. No murmur heard.   No friction rub. No gallop.  Pulmonary:     Effort: Pulmonary effort is normal. No respiratory distress.     Breath sounds: Normal breath sounds. No wheezing, rhonchi or rales.  Chest:     Chest wall: No tenderness.  Abdominal:     General: Bowel sounds are normal. There is no distension.     Palpations: Abdomen is soft. There is no mass.     Tenderness: There is no abdominal tenderness. There is no right CVA tenderness, left CVA tenderness, guarding or rebound.  Musculoskeletal:        General: No swelling or tenderness. Normal range of motion.     Cervical back: No rigidity or tenderness.     Right lower leg: No edema.     Left lower leg: No edema.  Lymphadenopathy:     Cervical: No cervical adenopathy.  Skin:    General: Skin  is warm and dry.     Coloration: Skin is not pale.     Findings: No bruising, erythema, lesion or rash.  Neurological:     Mental Status: She is alert and oriented to person, place, and time.     Cranial Nerves: No cranial nerve deficit.     Sensory: No sensory deficit.      Motor: No weakness.     Coordination: Coordination normal.     Gait: Gait abnormal.  Psychiatric:        Mood and Affect: Mood normal.        Speech: Speech normal.        Behavior: Behavior normal.        Thought Content: Thought content normal.        Cognition and Memory: Memory is impaired.        Judgment: Judgment normal.    Labs reviewed: Recent Labs    04/24/20 0547 04/25/20 0628 05/20/20 1550  NA 134* 136 138  K 4.0 3.8 4.3  CL 98 102 103  CO2 _0 GLUCOSE 147* 161* 93  BUN _1 CREATININE 0.85 0.63 0.94*  CALCIUM 9.3 9.6 9.7   No results for input(s): AST, ALT, ALKPHOS, BILITOT, PROT, ALBUMIN in the last 8760 hours. Recent Labs    04/01/20 1614 04/23/20 1051 04/24/20 0547 04/25/20 0628 05/20/20 1550  WBC 8.8   < > 7.5 7.0 7.1  NEUTROABS 5,878  --   --   --  4,487  HGB 13.3   < > 13.1 13.5 12.8  HCT 39.9   < > 40.0 41.0 39.2  MCV 93.7   < > 94.8 94.0 94.5  PLT 242   < > 241 246 206   < > = values in this interval not displayed.   Lab Results  Component Value Date   TSH 1.35 09/04/2017   Lab Results  Component Value Date   HGBA1C 6.8 (H) 04/01/2020   Lab Results  Component Value Date   CHOL 183 09/06/2017   HDL 63 09/06/2017   LDLCALC 99 09/06/2017   LDLDIRECT 65.7 07/08/2010   TRIG 113 09/06/2017   CHOLHDL 2.9 09/06/2017    Significant Diagnostic Results in last 30 days:  No results found.  Assessment/Plan 1. Controlled type 2 diabetes mellitus with diabetic neuropathy, without long-term current use of insulin (HCC) Lab Results  Component Value Date   HGBA1C 6.8 (H) 04/01/2020  - continue on sitaGliptin  Continue to monitor CBG On Lyrica for Neuropathy  - Hemoglobin A1c  2. Essential hypertension B/p well controlled  Continue on diltiazem ,furosemide  - CBC with Differential/Platelet - BMP with eGFR([facility])  Family/ staff Communication: Reviewed plan of care with patient verbalized understanding   Labs/tests ordered:   - CBC with Differential/Platelet - BMP with eGFR([facility]) - Hemoglobin A1c  Next Appointment: Has appointment 06/15/2021  Sandrea Hughs, NP

## 2020-12-10 ENCOUNTER — Telehealth: Payer: Self-pay | Admitting: *Deleted

## 2020-12-10 LAB — CBC WITH DIFFERENTIAL/PLATELET
Absolute Monocytes: 537 cells/uL (ref 200–950)
Basophils Absolute: 30 cells/uL (ref 0–200)
Basophils Relative: 0.5 %
Eosinophils Absolute: 153 cells/uL (ref 15–500)
Eosinophils Relative: 2.6 %
HCT: 40.4 % (ref 35.0–45.0)
Hemoglobin: 13.3 g/dL (ref 11.7–15.5)
Lymphs Abs: 1977 cells/uL (ref 850–3900)
MCH: 30.9 pg (ref 27.0–33.0)
MCHC: 32.9 g/dL (ref 32.0–36.0)
MCV: 93.7 fL (ref 80.0–100.0)
MPV: 11.7 fL (ref 7.5–12.5)
Monocytes Relative: 9.1 %
Neutro Abs: 3204 cells/uL (ref 1500–7800)
Neutrophils Relative %: 54.3 %
Platelets: 220 10*3/uL (ref 140–400)
RBC: 4.31 10*6/uL (ref 3.80–5.10)
RDW: 12 % (ref 11.0–15.0)
Total Lymphocyte: 33.5 %
WBC: 5.9 10*3/uL (ref 3.8–10.8)

## 2020-12-10 LAB — HEMOGLOBIN A1C
Hgb A1c MFr Bld: 6.4 % of total Hgb — ABNORMAL HIGH (ref <5.7)
Mean Plasma Glucose: 137 mg/dL
eAG (mmol/L): 7.6 mmol/L

## 2020-12-10 LAB — BASIC METABOLIC PANEL WITH GFR
BUN: 20 mg/dL (ref 7–25)
CO2: 30 mmol/L (ref 20–32)
Calcium: 10.2 mg/dL (ref 8.6–10.4)
Chloride: 102 mmol/L (ref 98–110)
Creat: 0.76 mg/dL (ref 0.60–0.95)
Glucose, Bld: 107 mg/dL (ref 65–139)
Potassium: 4.1 mmol/L (ref 3.5–5.3)
Sodium: 140 mmol/L (ref 135–146)
eGFR: 75 mL/min/{1.73_m2} (ref >=60)

## 2020-12-10 NOTE — Telephone Encounter (Signed)
Patient daughter, Rosaisela called and stated that patient came in yesterday for an appointment.   Daughter is wanting to now why a Flu Shot was not given. Stated that no one never said anything about patient getting one. Patient always gets the Flu Vaccine.   Daughter is wanting to know why this was not mentioned and done.   Please Advise.

## 2020-12-13 NOTE — Telephone Encounter (Signed)
Patient daughter Notified but didn't agree with the answer. Stated that this time of the year this should have been offered at visit.

## 2020-12-13 NOTE — Telephone Encounter (Signed)
Ariana [name], [name], NP  You 3 days ago   Just addressed concerned issues yesterday.[name] get Influenza vaccine at the pharmacy or office.

## 2020-12-14 ENCOUNTER — Other Ambulatory Visit: Payer: Self-pay

## 2020-12-14 DIAGNOSIS — E1140 Type 2 diabetes mellitus with diabetic neuropathy, unspecified: Secondary | ICD-10-CM

## 2020-12-14 DIAGNOSIS — I10 Essential (primary) hypertension: Secondary | ICD-10-CM

## 2020-12-17 ENCOUNTER — Other Ambulatory Visit: Payer: Self-pay | Admitting: Family

## 2020-12-17 DIAGNOSIS — M545 Low back pain, unspecified: Secondary | ICD-10-CM

## 2020-12-17 DIAGNOSIS — G6289 Other specified polyneuropathies: Secondary | ICD-10-CM

## 2020-12-20 MED ORDER — PREGABALIN 100 MG PO CAPS: 100.0000 mg | ORAL_CAPSULE | Freq: Two times a day (BID) | ORAL | 1 refills | Status: DC

## 2020-12-20 NOTE — Telephone Encounter (Signed)
Lyrica is controlled, will route to Ngetich, Dinah C, NP to approve. RX last refilled 07/19/2020 # 60 with no refills.  Treatment agreement on file from 12/09/20

## 2020-12-20 NOTE — Addendum Note (Signed)
Addended by: Logan Bores on: 12/20/2020 01:11 PM   Modules accepted: Orders

## 2020-12-23 ENCOUNTER — Other Ambulatory Visit: Payer: Self-pay | Admitting: *Deleted

## 2020-12-23 DIAGNOSIS — E119 Type 2 diabetes mellitus without complications: Secondary | ICD-10-CM

## 2020-12-23 MED ORDER — SITAGLIPTIN PHOSPHATE 100 MG PO TABS: 100.0000 mg | ORAL_TABLET | Freq: Every day | ORAL | 1 refills | Status: DC

## 2020-12-23 NOTE — Telephone Encounter (Signed)
Pharmacy requested refill.  Pended Rx and sent to [facility] for approval due to HIGH ALERT Warning.  

## 2020-12-29 ENCOUNTER — Other Ambulatory Visit: Payer: Self-pay | Admitting: Orthopedic Surgery

## 2020-12-29 DIAGNOSIS — K582 Mixed irritable bowel syndrome: Secondary | ICD-10-CM

## 2021-01-10 ENCOUNTER — Ambulatory Visit: Payer: Medicare Other | Admitting: Physician Assistant

## 2021-01-15 ENCOUNTER — Other Ambulatory Visit: Payer: Self-pay | Admitting: Family

## 2021-01-15 DIAGNOSIS — G6289 Other specified polyneuropathies: Secondary | ICD-10-CM

## 2021-01-17 ENCOUNTER — Other Ambulatory Visit: Payer: Self-pay | Admitting: *Deleted

## 2021-01-17 DIAGNOSIS — G6289 Other specified polyneuropathies: Secondary | ICD-10-CM

## 2021-01-17 MED ORDER — DULOXETINE HCL 30 MG PO CPEP: 30.0000 mg | ORAL_CAPSULE | Freq: Every day | ORAL | 1 refills | Status: DC

## 2021-01-17 NOTE — Telephone Encounter (Signed)
Pharmacy requested refill.  Pended Rx and sent to Jessica for approval due to Dinah out of office.  

## 2021-01-19 ENCOUNTER — Other Ambulatory Visit: Payer: Self-pay | Admitting: *Deleted

## 2021-01-19 DIAGNOSIS — E119 Type 2 diabetes mellitus without complications: Secondary | ICD-10-CM

## 2021-01-19 MED ORDER — LANCETS MICRO THIN 33G MISC: 3 refills | Status: DC

## 2021-01-19 NOTE — Telephone Encounter (Signed)
Pharmacy requested refill

## 2021-02-14 ENCOUNTER — Encounter (HOSPITAL_BASED_OUTPATIENT_CLINIC_OR_DEPARTMENT_OTHER): Payer: Self-pay | Admitting: Emergency Medicine

## 2021-02-14 ENCOUNTER — Emergency Department (HOSPITAL_BASED_OUTPATIENT_CLINIC_OR_DEPARTMENT_OTHER)
Admission: EM | Admit: 2021-02-14 | Discharge: 2021-02-14 | Disposition: A | Discharge status: Home / Self Care | Payer: Medicare Other | Attending: Emergency Medicine | Admitting: Emergency Medicine

## 2021-02-14 ENCOUNTER — Other Ambulatory Visit: Payer: Self-pay

## 2021-02-14 ENCOUNTER — Emergency Department (HOSPITAL_BASED_OUTPATIENT_CLINIC_OR_DEPARTMENT_OTHER): Payer: Medicare Other

## 2021-02-14 DIAGNOSIS — J029 Acute pharyngitis, unspecified: Secondary | ICD-10-CM | POA: Insufficient documentation

## 2021-02-14 DIAGNOSIS — R109 Unspecified abdominal pain: Secondary | ICD-10-CM | POA: Insufficient documentation

## 2021-02-14 DIAGNOSIS — R160 Hepatomegaly, not elsewhere classified: Secondary | ICD-10-CM | POA: Diagnosis not present

## 2021-02-14 DIAGNOSIS — Z20822 Contact with and (suspected) exposure to covid-19: Secondary | ICD-10-CM | POA: Diagnosis not present

## 2021-02-14 DIAGNOSIS — R1084 Generalized abdominal pain: Secondary | ICD-10-CM | POA: Diagnosis not present

## 2021-02-14 DIAGNOSIS — R531 Weakness: Secondary | ICD-10-CM | POA: Diagnosis not present

## 2021-02-14 DIAGNOSIS — N390 Urinary tract infection, site not specified: Secondary | ICD-10-CM

## 2021-02-14 DIAGNOSIS — R300 Dysuria: Secondary | ICD-10-CM | POA: Diagnosis not present

## 2021-02-14 DIAGNOSIS — R404 Transient alteration of awareness: Secondary | ICD-10-CM | POA: Diagnosis not present

## 2021-02-14 DIAGNOSIS — R059 Cough, unspecified: Secondary | ICD-10-CM | POA: Diagnosis not present

## 2021-02-14 LAB — CBC
HCT: 37.7 % (ref 36.0–46.0)
Hemoglobin: 12.8 g/dL (ref 12.0–15.0)
MCH: 31.5 pg (ref 26.0–34.0)
MCHC: 34 g/dL (ref 30.0–36.0)
MCV: 92.9 fL (ref 80.0–100.0)
Platelets: 190 10*3/uL (ref 150–400)
RBC: 4.06 MIL/uL (ref 3.87–5.11)
RDW: 13.2 % (ref 11.5–15.5)
WBC: 5.4 10*3/uL (ref 4.0–10.5)
nRBC: 0 % (ref 0.0–0.2)

## 2021-02-14 LAB — URINALYSIS, ROUTINE W REFLEX MICROSCOPIC
Bilirubin Urine: NEGATIVE
Glucose, UA: NEGATIVE mg/dL
Hgb urine dipstick: NEGATIVE
Ketones, ur: NEGATIVE mg/dL
Nitrite: POSITIVE — AB
Protein, ur: NEGATIVE mg/dL
Specific Gravity, Urine: 1.015 (ref 1.005–1.030)
pH: 7 (ref 5.0–8.0)

## 2021-02-14 LAB — COMPREHENSIVE METABOLIC PANEL
ALT: 37 U/L (ref 0–44)
AST: 26 U/L (ref 15–41)
Albumin: 4 g/dL (ref 3.5–5.0)
Alkaline Phosphatase: 104 U/L (ref 38–126)
Anion gap: 9 (ref 5–15)
BUN: 21 mg/dL (ref 8–23)
CO2: 26 mmol/L (ref 22–32)
Calcium: 9.6 mg/dL (ref 8.9–10.3)
Chloride: 102 mmol/L (ref 98–111)
Creatinine, Ser: 0.75 mg/dL (ref 0.44–1.00)
GFR, Estimated: >60 mL/min (ref >60)
Glucose, Bld: 124 mg/dL — ABNORMAL HIGH (ref 70–99)
Potassium: 4.2 mmol/L (ref 3.5–5.1)
Sodium: 137 mmol/L (ref 135–145)
Total Bilirubin: 0.6 mg/dL (ref 0.3–1.2)
Total Protein: 7.7 g/dL (ref 6.5–8.1)

## 2021-02-14 LAB — URINALYSIS, MICROSCOPIC (REFLEX): RBC / HPF: NONE SEEN RBC/hpf (ref 0–5)

## 2021-02-14 LAB — RESP PANEL BY RT-PCR (FLU A&B, COVID) ARPGX2
Influenza A by PCR: NEGATIVE
Influenza B by PCR: NEGATIVE
SARS Coronavirus 2 by RT PCR: NEGATIVE

## 2021-02-14 MED ORDER — SODIUM CHLORIDE 0.9 % IV BOLUS
1000.0000 mL | Freq: Once | INTRAVENOUS | Status: AC
  Administered 2021-02-14: 1000 mL via INTRAVENOUS

## 2021-02-14 MED ORDER — SODIUM CHLORIDE 0.9 % IV BOLUS
1000.0000 mL | Freq: Once | INTRAVENOUS | Status: AC
Start: 2021-02-14 — End: 2021-02-14
  Administered 2021-02-14: 1000 mL via INTRAVENOUS

## 2021-02-14 MED ORDER — ACETAMINOPHEN 325 MG PO TABS
650.0000 mg | ORAL_TABLET | Freq: Once | ORAL | Status: AC
  Administered 2021-02-14: 650 mg via ORAL
  Filled 2021-02-14: qty 2

## 2021-02-14 MED ORDER — SODIUM CHLORIDE 0.9 % IV SOLN
1.0000 g | Freq: Once | INTRAVENOUS | Status: AC
  Administered 2021-02-14: 1 g via INTRAVENOUS
  Filled 2021-02-14: qty 10

## 2021-02-14 MED ORDER — CEPHALEXIN 500 MG PO CAPS: 500.0000 mg | ORAL_CAPSULE | Freq: Three times a day (TID) | ORAL | 0 refills | Status: DC

## 2021-02-14 MED ORDER — IOHEXOL 300 MG/ML  SOLN
100.0000 mL | Freq: Once | INTRAMUSCULAR | Status: AC | PRN
  Administered 2021-02-14: 100 mL via INTRAVENOUS

## 2021-02-14 NOTE — ED Provider Notes (Signed)
Blood pressure (!) 148/68, pulse 62, temperature 97.9 F (36.6 C), temperature source Oral, resp. rate 17, SpO2 98 %.  Assuming care from Dr. Ashok Cordia.  In short, Ariana [name] is a 88 y.o. female with a chief complaint of Dysuria .  Refer to the original H&P for additional details.  The current plan of care is to follow up on CT and reassess.  04:43 PM  Patient is urinating here without difficulty.  She has soaked diaper and is able to sit on the bedside commode for additional urination.  Do not feel the Foley catheter is indicated.  Plan for treatment of the urinary tract infection.  Patient has known hepatic mass with no complicating features here on CT. plan for outpatient follow-up of this and urinary tract infection treatment.    Margette Fast, MD 02/14/21 [phone]

## 2021-02-14 NOTE — ED Notes (Signed)
Pts caregiver brought to the room. Pt does not want caregiver in room, using hands to motion for her to leave

## 2021-02-14 NOTE — ED Notes (Signed)
Patient transported to CT 

## 2021-02-14 NOTE — ED Provider Notes (Addendum)
[place] EMERGENCY DEPARTMENT Provider Note   CSN: [id number] Arrival date & time: 02/14/21  1127     History  Chief Complaint  Patient presents with   Dysuria    Ariana [name] is a 88 y.o. female.  Patient c/o generally feeling weak in past few days, with relatively poor po intake and dysuria - symptoms acute onset, moderate, constant, persistent. No fevers. No nausea/vomiting. No abd pain. No back/flank pain. Occasional non prod cough and mildly sore throat, 'feels dry' - no trouble breathing or swallowing. No fever or chills. No specific known ill contacts. No chest pain or discomfort. No fall, trauma, syncope, or injury.   The history is provided by the patient, a caregiver, medical records and the EMS personnel.  Dysuria Associated symptoms: no abdominal pain, no fever, no flank pain and no vomiting       Home Medications Prior to Admission medications   Medication Sig Start Date End Date Taking? Authorizing Provider  acetaminophen (TYLENOL) 500 MG tablet Take 1,000 mg by mouth 2 (two) times daily.    [provider]  ALPRAZolam Duanne Moron) 0.25 MG tablet Take 1 tablet by mouth twice daily as needed for anxiety 11/25/20   Ngetich, Dinah C, NP  antiseptic oral rinse (BIOTENE) LIQD 15 mLs by Mouth Rinse route as needed.    [provider]  Capsaicin-Menthol-Methyl Sal (CAPSAICIN-METHYL SAL-MENTHOL) 0.025-1-12 % CREA Apply 1 application topically 2 (two) times daily. 09/11/18   Ngetich, Dinah C, NP  carboxymethylcellulose (REFRESH PLUS) 0.5 % SOLN Place 1 drop into both eyes in the morning, at noon, in the evening, and at bedtime.    [provider]  CRANBERRY PO Take 2 tablets by mouth daily. AZO    [provider]  diltiazem (CARDIZEM CD) 180 MG 24 hr capsule Take 1 capsule (180 mg total) by mouth daily. 08/09/20   Hilty, Nadean Corwin, MD  DULoxetine (CYMBALTA) 30 MG capsule Take 1 capsule (30 mg total) by mouth daily. Take along with 60  mg to = 90 mg for back pain and depression 01/17/21   Lauree Chandler, NP  DULoxetine (CYMBALTA) 60 MG capsule Take 1 capsule by mouth once daily 12/20/20   Ngetich, Dinah C, NP  furosemide (LASIX) 40 MG tablet Take 1 tablet by mouth once daily 10/01/20   Ngetich, Dinah C, NP  glucose blood (ONETOUCH VERIO) test strip Use to test blood sugar daily. Dx:E08.65 05/12/20   Fargo, Amy E, NP  Lancets Micro Thin 33G MISC Use to test blood sugar daily. Dx: E08.65 01/19/21   Ngetich, Dinah C, NP  lansoprazole (PREVACID) 30 MG capsule TAKE 1 CAPSULE BY MOUTH ONCE DAILY AT NOON 11/08/20   Ngetich, Dinah C, NP  LINZESS 290 MCG CAPS capsule TAKE 1 CAPSULE BY MOUTH EVERY DAY AT BEDTIME 06/21/20   Ngetich, Dinah C, NP  LINZESS 290 MCG CAPS capsule TAKE 1 CAPSULE BY MOUTH EVERY DAY AT BEDTIME 12/30/20   Ngetich, Dinah C, NP  loratadine (CLARITIN) 10 MG tablet Take 1 tablet (10 mg total) by mouth daily. 05/12/20   Yvonna Alanis, NP  memantine (NAMENDA) 10 MG tablet Take 1 tablet by mouth twice daily 12/20/20   Ngetich, Dinah C, NP  mirabegron ER (MYRBETRIQ) 50 MG TB24 tablet Take 1 tablet by mouth once daily 11/18/20   Ngetich, Dinah C, NP  pilocarpine (SALAGEN) 5 MG tablet Take 1 tablet by mouth twice daily 10/22/20   Ngetich, Dinah C, NP  polyethylene glycol (  MIRALAX / GLYCOLAX) packet Take 17 g by mouth daily as needed for mild constipation (constipation). Mix with 8 oz. water or juice    [provider]  potassium chloride SA (KLOR-CON) 20 MEQ tablet Take 1 tablet by mouth once daily 07/22/20   Ngetich, Dinah C, NP  pregabalin (LYRICA) 100 MG capsule Take 1 capsule (100 mg total) by mouth 2 (two) times daily. 12/20/20   Ngetich, Dinah C, NP  senna (SENOKOT) 8.6 MG tablet Take 1 tablet (8.6 mg total) by mouth daily. 07/19/20   Fargo, Amy E, NP  senna-docusate (SENNA S) 8.6-50 MG tablet Take 1 tablet by mouth daily. 05/12/20   Fargo, Amy E, NP  sitaGLIPtin (JANUVIA) 100 MG tablet Take 1 tablet (100 mg total) by mouth  daily. 12/23/20   Ngetich, Dinah C, NP      Allergies    Banana, Codeine, Klonopin [clonazepam], Meperidine hcl, Norflex [orphenadrine citrate], Oxycodone-acetaminophen, Propoxyphene hcl, Zoloft [sertraline hcl], Doxycycline, Naproxen, Penicillins, Phenothiazines, Stelazine, Sulfamethoxazole-trimethoprim, Tolectin [tolmetin sodium], and Tramadol    Review of Systems   Review of Systems  Constitutional:  Negative for chills and fever.  HENT:  Negative for trouble swallowing.   Eyes:  Negative for redness.  Respiratory:  Negative for shortness of breath.   Cardiovascular:  Negative for chest pain.  Gastrointestinal:  Negative for abdominal pain, diarrhea and vomiting.  Genitourinary:  Positive for dysuria. Negative for flank pain.  Musculoskeletal:  Negative for back pain.  Skin:  Negative for rash.  Neurological:  Negative for headaches.  Hematological:  Does not bruise/bleed easily.  Psychiatric/Behavioral:  Negative for agitation.    Physical Exam Updated Vital Signs BP (!) 146/70 (BP Location: Left Arm)    Pulse 73    Temp 97.9 F (36.6 C) (Oral)    Resp 17    SpO2 96%  Physical Exam Vitals and nursing note reviewed.  Constitutional:      Appearance: Normal appearance. She is well-developed.  HENT:     Head: Atraumatic.     Nose: Nose normal.     Mouth/Throat:     Comments: Dry mm.  Eyes:     General: No scleral icterus.    Conjunctiva/sclera: Conjunctivae normal.     Pupils: Pupils are equal, round, and reactive to light.  Neck:     Trachea: No tracheal deviation.     Comments: No stiffness or rigidity.  Cardiovascular:     Rate and Rhythm: Normal rate and regular rhythm.     Pulses: Normal pulses.     Heart sounds: Normal heart sounds. No murmur heard.   No friction rub. No gallop.  Pulmonary:     Effort: Pulmonary effort is normal. No respiratory distress.     Breath sounds: Normal breath sounds.  Abdominal:     General: Bowel sounds are normal. There is  distension.     Palpations: Abdomen is soft. There is no mass.     Tenderness: There is abdominal tenderness. There is no guarding or rebound.     Comments: Mild distension. Lower abd tenderness.   Genitourinary:    Comments: No cva tenderness.  Musculoskeletal:        General: No swelling or tenderness.     Cervical back: Normal range of motion and neck supple. No rigidity or tenderness. No muscular tenderness.  Skin:    General: Skin is warm and dry.     Findings: No rash.  Neurological:     Mental Status: She is alert.  Comments: Alert, speech normal. Motor/sens grossly intact bil.   Psychiatric:        Mood and Affect: Mood normal.    ED Results / Procedures / Treatments   Labs (all labs ordered are listed, but only abnormal results are displayed) Results for orders placed or performed during the hospital encounter of 02/14/21  Resp Panel by RT-PCR (Flu A&B, Covid) Nasopharyngeal Swab   Specimen: Nasopharyngeal Swab; Nasopharyngeal(NP) swabs in vial transport medium  Result Value Ref Range   SARS Coronavirus 2 by RT PCR NEGATIVE NEGATIVE   Influenza A by PCR NEGATIVE NEGATIVE   Influenza B by PCR NEGATIVE NEGATIVE  Urine Culture   Specimen: Urine, Catheterized  Result Value Ref Range   Specimen Description      URINE, CATHETERIZED Performed at [facility], [address] [place], [place] [address]    Special Requests      NONE Performed at [facility], [address] [place], [place] [address]    Culture >=100,000 COLONIES/mL ESCHERICHIA COLI (A)    Report Status 02/16/2021 FINAL    Organism ID, Bacteria ESCHERICHIA COLI (A)       Susceptibility   Escherichia coli - MIC*    AMPICILLIN <=2 SENSITIVE Sensitive     CEFAZOLIN <=4 SENSITIVE Sensitive     CEFEPIME <=0.12 SENSITIVE Sensitive     CEFTRIAXONE <=0.25 SENSITIVE Sensitive     CIPROFLOXACIN 0.5 INTERMEDIATE Intermediate     GENTAMICIN <=1 SENSITIVE Sensitive     IMIPENEM <=0.25  SENSITIVE Sensitive     NITROFURANTOIN <=16 SENSITIVE Sensitive     TRIMETH/SULFA <=20 SENSITIVE Sensitive     AMPICILLIN/SULBACTAM <=2 SENSITIVE Sensitive     PIP/TAZO <=4 SENSITIVE Sensitive     * >=100,000 COLONIES/mL ESCHERICHIA COLI  CBC  Result Value Ref Range   WBC 5.4 4.0 - 10.5 K/uL   RBC 4.06 3.87 - 5.11 MIL/uL   Hemoglobin 12.8 12.0 - 15.0 g/dL   HCT 37.7 36.0 - 46.0 %   MCV 92.9 80.0 - 100.0 fL   MCH 31.5 26.0 - 34.0 pg   MCHC 34.0 30.0 - 36.0 g/dL   RDW 13.2 11.5 - 15.5 %   Platelets 190 150 - 400 K/uL   nRBC 0.0 0.0 - 0.2 %  Comprehensive metabolic panel  Result Value Ref Range   Sodium 137 135 - 145 mmol/L   Potassium 4.2 3.5 - 5.1 mmol/L   Chloride 102 98 - 111 mmol/L   CO2 26 22 - 32 mmol/L   Glucose, Bld 124 (H) 70 - 99 mg/dL   BUN 21 8 - 23 mg/dL   Creatinine, Ser 0.75 0.44 - 1.00 mg/dL   Calcium 9.6 8.9 - 10.3 mg/dL   Total Protein 7.7 6.5 - 8.1 g/dL   Albumin 4.0 3.5 - 5.0 g/dL   AST 26 15 - 41 U/L   ALT 37 0 - 44 U/L   Alkaline Phosphatase 104 38 - 126 U/L   Total Bilirubin 0.6 0.3 - 1.2 mg/dL   GFR, Estimated >60 >60 mL/min   Anion gap 9 5 - 15  Urinalysis, Routine w reflex microscopic Urine, Catheterized  Result Value Ref Range   Color, Urine YELLOW YELLOW   APPearance CLEAR CLEAR   Specific Gravity, Urine 1.015 1.005 - 1.030   pH 7.0 5.0 - 8.0   Glucose, UA NEGATIVE NEGATIVE mg/dL   Hgb urine dipstick NEGATIVE NEGATIVE   Bilirubin Urine NEGATIVE NEGATIVE   Ketones,  ur NEGATIVE NEGATIVE mg/dL   Protein, ur NEGATIVE NEGATIVE mg/dL   Nitrite POSITIVE (A) NEGATIVE   Leukocytes,Ua SMALL (A) NEGATIVE  Urinalysis, Microscopic (reflex)  Result Value Ref Range   RBC / HPF NONE SEEN 0 - 5 RBC/hpf   WBC, UA 11-20 0 - 5 WBC/hpf   Bacteria, UA MANY (A) NONE SEEN   Squamous Epithelial / LPF 0-5 0 - 5   *Note: Due to a large number of results and/or encounters for the requested time period, some results have not been displayed. A complete set of  results can be found in Results Review.    EKG None  Radiology CT Abdomen Pelvis W Contrast  Result Date: 02/14/2021 CLINICAL DATA:  Abdominal pain, acute, nonlocalized.  Dysuria. EXAM: CT ABDOMEN AND PELVIS WITH CONTRAST TECHNIQUE: Multidetector CT imaging of the abdomen and pelvis was performed using the standard protocol following bolus administration of intravenous contrast. CONTRAST:  148mL OMNIPAQUE IOHEXOL 300 MG/ML  SOLN COMPARISON:  CT 05/20/2019 and abdominal MRI 03/31/2019 FINDINGS: Lower chest: Probable tiny calcific granuloma in the lingula. No pleural effusions. Hepatobiliary: Again noted is a large hepatic mass in the inferior right hepatic lobe. Lesion measures 9.8 x 7.4 x 8.1 cm and previously measured 4.5 x 5.2 x 5.5 cm. Lesion remains heterogeneous with internal low-density nodular areas. No blood or stranding around the right hepatic mass. Mild left intrahepatic biliary dilatation. Cholecystectomy. Again noted is a prominent common bile duct. The distal common bile duct measures 0.8 cm and stable. Pancreas: Unremarkable. No pancreatic ductal dilatation or surrounding inflammatory changes. Spleen: Normal in size without focal abnormality. Adrenals/Urinary Tract: Normal adrenal glands. Again noted is a slightly dense structure in the left kidney lower pole that measures 0.8 cm and stable. This appeared to represent a cyst on the previous MRI. Additional small low-density structures in left kidney that are too small to definitively characterize. No suspicious right renal lesions. Mild dilatation of both ureters, right side greater than left. Urinary bladder is distended and contains a small amount of gas. Stomach/Bowel: Multiple diverticula involving the sigmoid colon without inflammation. No evidence for small bowel distension. Normal appearance of the stomach. Vascular/Lymphatic: No significant vascular findings are present. No enlarged abdominal or pelvic lymph nodes. Reproductive: Status  post hysterectomy. No adnexal masses. Other: Small left inguinal hernia containing fat. Negative for ascites. Negative for free air. Small umbilical hernia containing fat. Musculoskeletal: Disc space narrowing at L5-S1. Evidence for hemangioma involving the L3 vertebral body. No acute bone abnormality. IMPRESSION: 1. Significant enlargement of the right inferior hepatic mass since 05/20/2019. Mass measures up to 9.8 cm. There is no evidence for stranding or hemorrhage around the mass. No additional hepatic lesions identified. 2. Urinary bladder is very distended and suggestive for urinary retention. Mild dilatation of the ureters likely secondary to the bladder distension. Small focus of gas within the urinary bladder could be related to recent catheterization and recommend clinical correlation. 3. Stable mild dilatation of the common bile duct. Minimal dilatation of the left intrahepatic bile ducts. These findings are likely secondary to cholecystectomy. 4. Colonic diverticulosis without acute bowel inflammation. 5. Left renal cysts. Electronically Signed   By: Markus Daft M.D.   On: 02/14/2021 16:15    Procedures Procedures    Medications Ordered in ED Medications  sodium chloride 0.9 % bolus 1,000 mL (has no administration in time range)    ED Course/ Medical Decision Making/ A&P  Medical Decision Making  Iv ns bolus. Stat labs.   Reviewed nursing notes and prior charts for additional history. External reports reviewed. Additional hx provided by ems and caregiver.   Labs reviewed/interpreted by me - covid and flu neg. Wbc normal. UA w possible uti. Culture sent. Iv rocephin given.   Parenteral meds given, consideration for hospital admission due to weakness, uti, abd pain - imaging pending.   Pt c/o lower abd pain, +tenderness on exam. Will get ct.   Recheck pt, vitals stable. No vomiting or diarrhea. Await ct.   Signed out to Dr Laverta Baltimore to check ct, recheck pt,  and disposition appropriately.  CT reviewed/interpreted by me - hepatic mass, urinary retention.            Final Clinical Impression(s) / ED Diagnoses Final diagnoses:  None    Rx / DC Orders ED Discharge Orders     None         Lajean Saver, MD 02/16/21 1235

## 2021-02-14 NOTE — ED Triage Notes (Signed)
Per EMS:  Dysuria x 3-4 days.  No known fever.  Pt has some chronic abdominal pain due to abdominal mass, pt not a candidate for surgery.  Pt has dementia and is baseline for her.

## 2021-02-14 NOTE — ED Notes (Signed)
Pt was argumentative and confused.Pt did not want to be discharged Persuaded to get in vehicle with caregiver.

## 2021-02-14 NOTE — ED Provider Notes (Incomplete Revision)
[place] EMERGENCY DEPARTMENT Provider Note   CSN: [id number] Arrival date & time: 02/14/21  1127     History  Chief Complaint  Patient presents with   Dysuria    Ariana [name] is a 88 y.o. female.  Patient c/o generally feeling weak in past few days, with relatively poor po intake and dysuria - symptoms acute onset, moderate, constant, persistent. No fevers. No nausea/vomiting. No abd pain. No back/flank pain. Occasional non prod cough and mildly sore throat, 'feels dry' - no trouble breathing or swallowing. No fever or chills. No specific known ill contacts. No chest pain or discomfort. No fall, trauma, syncope, or injury.   The history is provided by the patient, a caregiver, medical records and the EMS personnel.  Dysuria Associated symptoms: no abdominal pain, no fever, no flank pain and no vomiting       Home Medications Prior to Admission medications   Medication Sig Start Date End Date Taking? Authorizing Provider  acetaminophen (TYLENOL) 500 MG tablet Take 1,000 mg by mouth 2 (two) times daily.    [provider]  ALPRAZolam Duanne Moron) 0.25 MG tablet Take 1 tablet by mouth twice daily as needed for anxiety 11/25/20   Ngetich, Dinah C, NP  antiseptic oral rinse (BIOTENE) LIQD 15 mLs by Mouth Rinse route as needed.    [provider]  Capsaicin-Menthol-Methyl Sal (CAPSAICIN-METHYL SAL-MENTHOL) 0.025-1-12 % CREA Apply 1 application topically 2 (two) times daily. 09/11/18   Ngetich, Dinah C, NP  carboxymethylcellulose (REFRESH PLUS) 0.5 % SOLN Place 1 drop into both eyes in the morning, at noon, in the evening, and at bedtime.    [provider]  CRANBERRY PO Take 2 tablets by mouth daily. AZO    [provider]  diltiazem (CARDIZEM CD) 180 MG 24 hr capsule Take 1 capsule (180 mg total) by mouth daily. 08/09/20   Hilty, Nadean Corwin, MD  DULoxetine (CYMBALTA) 30 MG capsule Take 1 capsule (30 mg total) by mouth daily. Take along with 60  mg to = 90 mg for back pain and depression 01/17/21   Lauree Chandler, NP  DULoxetine (CYMBALTA) 60 MG capsule Take 1 capsule by mouth once daily 12/20/20   Ngetich, Dinah C, NP  furosemide (LASIX) 40 MG tablet Take 1 tablet by mouth once daily 10/01/20   Ngetich, Dinah C, NP  glucose blood (ONETOUCH VERIO) test strip Use to test blood sugar daily. Dx:E08.65 05/12/20   Fargo, Amy E, NP  Lancets Micro Thin 33G MISC Use to test blood sugar daily. Dx: E08.65 01/19/21   Ngetich, Dinah C, NP  lansoprazole (PREVACID) 30 MG capsule TAKE 1 CAPSULE BY MOUTH ONCE DAILY AT NOON 11/08/20   Ngetich, Dinah C, NP  LINZESS 290 MCG CAPS capsule TAKE 1 CAPSULE BY MOUTH EVERY DAY AT BEDTIME 06/21/20   Ngetich, Dinah C, NP  LINZESS 290 MCG CAPS capsule TAKE 1 CAPSULE BY MOUTH EVERY DAY AT BEDTIME 12/30/20   Ngetich, Dinah C, NP  loratadine (CLARITIN) 10 MG tablet Take 1 tablet (10 mg total) by mouth daily. 05/12/20   Yvonna Alanis, NP  memantine (NAMENDA) 10 MG tablet Take 1 tablet by mouth twice daily 12/20/20   Ngetich, Dinah C, NP  mirabegron ER (MYRBETRIQ) 50 MG TB24 tablet Take 1 tablet by mouth once daily 11/18/20   Ngetich, Dinah C, NP  pilocarpine (SALAGEN) 5 MG tablet Take 1 tablet by mouth twice daily 10/22/20   Ngetich, Dinah C, NP  polyethylene glycol (  MIRALAX / GLYCOLAX) packet Take 17 g by mouth daily as needed for mild constipation (constipation). Mix with 8 oz. water or juice    [provider]  potassium chloride SA (KLOR-CON) 20 MEQ tablet Take 1 tablet by mouth once daily 07/22/20   Ngetich, Dinah C, NP  pregabalin (LYRICA) 100 MG capsule Take 1 capsule (100 mg total) by mouth 2 (two) times daily. 12/20/20   Ngetich, Dinah C, NP  senna (SENOKOT) 8.6 MG tablet Take 1 tablet (8.6 mg total) by mouth daily. 07/19/20   Fargo, Amy E, NP  senna-docusate (SENNA S) 8.6-50 MG tablet Take 1 tablet by mouth daily. 05/12/20   Fargo, Amy E, NP  sitaGLIPtin (JANUVIA) 100 MG tablet Take 1 tablet (100 mg total) by mouth  daily. 12/23/20   Ngetich, Dinah C, NP      Allergies    Banana, Codeine, Klonopin [clonazepam], Meperidine hcl, Norflex [orphenadrine citrate], Oxycodone-acetaminophen, Propoxyphene hcl, Zoloft [sertraline hcl], Doxycycline, Naproxen, Penicillins, Phenothiazines, Stelazine, Sulfamethoxazole-trimethoprim, Tolectin [tolmetin sodium], and Tramadol    Review of Systems   Review of Systems  Constitutional:  Negative for chills and fever.  HENT:  Negative for trouble swallowing.   Eyes:  Negative for redness.  Respiratory:  Negative for shortness of breath.   Cardiovascular:  Negative for chest pain.  Gastrointestinal:  Negative for abdominal pain, diarrhea and vomiting.  Genitourinary:  Positive for dysuria. Negative for flank pain.  Musculoskeletal:  Negative for back pain.  Skin:  Negative for rash.  Neurological:  Negative for headaches.  Hematological:  Does not bruise/bleed easily.  Psychiatric/Behavioral:  Negative for agitation.    Physical Exam Updated Vital Signs BP (!) 146/70 (BP Location: Left Arm)    Pulse 73    Temp 97.9 F (36.6 C) (Oral)    Resp 17    SpO2 96%  Physical Exam Vitals and nursing note reviewed.  Constitutional:      Appearance: Normal appearance. She is well-developed.  HENT:     Head: Atraumatic.     Nose: Nose normal.     Mouth/Throat:     Comments: Dry mm.  Eyes:     General: No scleral icterus.    Conjunctiva/sclera: Conjunctivae normal.     Pupils: Pupils are equal, round, and reactive to light.  Neck:     Trachea: No tracheal deviation.     Comments: No stiffness or rigidity.  Cardiovascular:     Rate and Rhythm: Normal rate and regular rhythm.     Pulses: Normal pulses.     Heart sounds: Normal heart sounds. No murmur heard.   No friction rub. No gallop.  Pulmonary:     Effort: Pulmonary effort is normal. No respiratory distress.     Breath sounds: Normal breath sounds.  Abdominal:     General: Bowel sounds are normal. There is  distension.     Palpations: Abdomen is soft. There is no mass.     Tenderness: There is abdominal tenderness. There is no guarding or rebound.     Comments: Mild distension. Lower abd tenderness.   Genitourinary:    Comments: No cva tenderness.  Musculoskeletal:        General: No swelling or tenderness.     Cervical back: Normal range of motion and neck supple. No rigidity or tenderness. No muscular tenderness.  Skin:    General: Skin is warm and dry.     Findings: No rash.  Neurological:     Mental Status: She is alert.  Comments: Alert, speech normal. Motor/sens grossly intact bil.   Psychiatric:        Mood and Affect: Mood normal.    ED Results / Procedures / Treatments   Labs (all labs ordered are listed, but only abnormal results are displayed) Results for orders placed or performed during the hospital encounter of 02/14/21  Resp Panel by RT-PCR (Flu A&B, Covid) Nasopharyngeal Swab   Specimen: Nasopharyngeal Swab; Nasopharyngeal(NP) swabs in vial transport medium  Result Value Ref Range   SARS Coronavirus 2 by RT PCR NEGATIVE NEGATIVE   Influenza A by PCR NEGATIVE NEGATIVE   Influenza B by PCR NEGATIVE NEGATIVE  CBC  Result Value Ref Range   WBC 5.4 4.0 - 10.5 K/uL   RBC 4.06 3.87 - 5.11 MIL/uL   Hemoglobin 12.8 12.0 - 15.0 g/dL   HCT 37.7 36.0 - 46.0 %   MCV 92.9 80.0 - 100.0 fL   MCH 31.5 26.0 - 34.0 pg   MCHC 34.0 30.0 - 36.0 g/dL   RDW 13.2 11.5 - 15.5 %   Platelets 190 150 - 400 K/uL   nRBC 0.0 0.0 - 0.2 %  Comprehensive metabolic panel  Result Value Ref Range   Sodium 137 135 - 145 mmol/L   Potassium 4.2 3.5 - 5.1 mmol/L   Chloride 102 98 - 111 mmol/L   CO2 26 22 - 32 mmol/L   Glucose, Bld 124 (H) 70 - 99 mg/dL   BUN 21 8 - 23 mg/dL   Creatinine, Ser 0.75 0.44 - 1.00 mg/dL   Calcium 9.6 8.9 - 10.3 mg/dL   Total Protein 7.7 6.5 - 8.1 g/dL   Albumin 4.0 3.5 - 5.0 g/dL   AST 26 15 - 41 U/L   ALT 37 0 - 44 U/L   Alkaline Phosphatase 104 38 - 126 U/L    Total Bilirubin 0.6 0.3 - 1.2 mg/dL   GFR, Estimated >60 >60 mL/min   Anion gap 9 5 - 15  Urinalysis, Routine w reflex microscopic Urine, Catheterized  Result Value Ref Range   Color, Urine YELLOW YELLOW   APPearance CLEAR CLEAR   Specific Gravity, Urine 1.015 1.005 - 1.030   pH 7.0 5.0 - 8.0   Glucose, UA NEGATIVE NEGATIVE mg/dL   Hgb urine dipstick NEGATIVE NEGATIVE   Bilirubin Urine NEGATIVE NEGATIVE   Ketones, ur NEGATIVE NEGATIVE mg/dL   Protein, ur NEGATIVE NEGATIVE mg/dL   Nitrite POSITIVE (A) NEGATIVE   Leukocytes,Ua SMALL (A) NEGATIVE  Urinalysis, Microscopic (reflex)  Result Value Ref Range   RBC / HPF NONE SEEN 0 - 5 RBC/hpf   WBC, UA 11-20 0 - 5 WBC/hpf   Bacteria, UA MANY (A) NONE SEEN   Squamous Epithelial / LPF 0-5 0 - 5   *Note: Due to a large number of results and/or encounters for the requested time period, some results have not been displayed. A complete set of results can be found in Results Review.    EKG None  Radiology No results found.  Procedures Procedures    Medications Ordered in ED Medications  sodium chloride 0.9 % bolus 1,000 mL (has no administration in time range)    ED Course/ Medical Decision Making/ A&P                           Medical Decision Making  Iv ns bolus. Stat labs.   Reviewed nursing notes and prior charts for additional history. External reports reviewed.  Additional hx provided by ems and caregiver.   Labs reviewed/interpreted by me - covid and flu neg. Wbc normal. UA w possible uti. Culture sent. Iv rocephin given.   Pt c/o lower abd pain, +tenderness on exam. Will get ct.   Recheck pt, vitals stable. No vomiting or diarrhea. Await ct.               Final Clinical Impression(s) / ED Diagnoses Final diagnoses:  None    Rx / DC Orders ED Discharge Orders     None

## 2021-02-14 NOTE — Discharge Instructions (Addendum)
It was our pleasure to provide your ER care today - we hope that you feel better.  Drink plenty of fluids/stay well hydrated.   Your lab tests show a possible urine infection - take antibiotic as prescribed.   Follow up with primary care doctor in one week if symptoms fail to improve/resolve. You do have a mass on the liver. Please discuss with your primary care doctor.   Return to ER if worse, new symptoms, high fevers, new, worsening or severe pain, persistent vomiting, trouble breathing, or other concern.

## 2021-02-14 NOTE — ED Notes (Signed)
Caregiver updated

## 2021-02-16 LAB — URINE CULTURE: Culture: >=100000 — AB

## 2021-02-17 ENCOUNTER — Telehealth (HOSPITAL_BASED_OUTPATIENT_CLINIC_OR_DEPARTMENT_OTHER): Payer: Self-pay | Admitting: *Deleted

## 2021-02-17 NOTE — Telephone Encounter (Signed)
Post ED Visit - Positive Culture Follow-up  Culture report reviewed by antimicrobial stewardship pharmacist: [place] Team []  Elenor Quinones, Pharm.D. [x]  Heide Guile, Pharm.D., BCPS AQ-ID []  Parks Neptune, Pharm.D., BCPS []  Alycia Rossetti, Pharm.D., BCPS []  [place], Pharm.D., BCPS, AAHIVP []  Legrand Como, Pharm.D., BCPS, AAHIVP []  Salome Arnt, PharmD, BCPS []  Johnnette Gourd, PharmD, BCPS []  Hughes Better, PharmD, BCPS []  Leeroy Cha, PharmD []  Laqueta Linden, PharmD, BCPS []  Albertina Parr, PharmD  [place] Team []  Leodis Sias, PharmD []  Lindell Spar, PharmD []  Royetta Asal, PharmD []  Graylin Shiver, Rph []  Rema Fendt) Glennon Mac, PharmD []  Arlyn Dunning, PharmD []  Netta Cedars, PharmD []  Dia Sitter, PharmD []  Leone Haven, PharmD []  Gretta Arab, PharmD []  Theodis Shove, PharmD []  Peggyann Juba, PharmD []  Reuel Boom, PharmD   Positive urine culture Treated with Cephalexin, organism sensitive to the same and no further patient follow-up is required at this time.  Rosie Fate 02/17/2021, 11:28 AM

## 2021-02-18 ENCOUNTER — Other Ambulatory Visit: Payer: Self-pay | Admitting: Family

## 2021-02-18 DIAGNOSIS — G6289 Other specified polyneuropathies: Secondary | ICD-10-CM

## 2021-02-18 DIAGNOSIS — M545 Low back pain, unspecified: Secondary | ICD-10-CM

## 2021-02-18 NOTE — Telephone Encounter (Signed)
Patient has request refill on medication "Lyrica". Patient medication pend and sent to PCP Ngetich, Nelda Bucks, NP for approval.

## 2021-04-01 ENCOUNTER — Other Ambulatory Visit: Payer: Self-pay | Admitting: Family

## 2021-04-01 DIAGNOSIS — I10 Essential (primary) hypertension: Secondary | ICD-10-CM

## 2021-04-21 ENCOUNTER — Other Ambulatory Visit: Payer: Self-pay | Admitting: Family

## 2021-04-21 DIAGNOSIS — K582 Mixed irritable bowel syndrome: Secondary | ICD-10-CM

## 2021-05-10 ENCOUNTER — Other Ambulatory Visit: Payer: Self-pay | Admitting: Family

## 2021-05-10 DIAGNOSIS — K2100 Gastro-esophageal reflux disease with esophagitis, without bleeding: Secondary | ICD-10-CM

## 2021-05-10 DIAGNOSIS — K219 Gastro-esophageal reflux disease without esophagitis: Secondary | ICD-10-CM

## 2021-05-20 ENCOUNTER — Ambulatory Visit
Admission: EM | Admit: 2021-05-20 | Discharge: 2021-05-20 | Disposition: A | Discharge status: Home / Self Care | Payer: Medicare Other | Attending: Emergency Medicine | Admitting: Emergency Medicine

## 2021-05-20 DIAGNOSIS — N390 Urinary tract infection, site not specified: Secondary | ICD-10-CM

## 2021-05-20 DIAGNOSIS — R350 Frequency of micturition: Secondary | ICD-10-CM | POA: Insufficient documentation

## 2021-05-20 LAB — POCT URINALYSIS DIP (MANUAL ENTRY)
Bilirubin, UA: NEGATIVE
Blood, UA: NEGATIVE
Glucose, UA: NEGATIVE mg/dL
Ketones, POC UA: NEGATIVE mg/dL
Nitrite, UA: POSITIVE — AB
Protein Ur, POC: NEGATIVE mg/dL
Spec Grav, UA: 1.01 (ref 1.010–1.025)
Urobilinogen, UA: 0.2 E.U./dL
pH, UA: 6.5 (ref 5.0–8.0)

## 2021-05-20 MED ORDER — SULFAMETHOXAZOLE-TRIMETHOPRIM 800-160 MG PO TABS: 1.0000 | ORAL_TABLET | Freq: Two times a day (BID) | ORAL | 0 refills | Status: AC

## 2021-05-20 NOTE — ED Triage Notes (Signed)
Pt c/o increased sleeping, frequent urination that began 2 weeks ago. ?

## 2021-05-20 NOTE — ED Provider Notes (Addendum)
[facility]    CSN: [id number] Arrival date & time: 05/20/21  1315    HISTORY   Chief Complaint  Patient presents with   Urinary Frequency   HPI Ariana [name] is a 88 y.o. female. Patient presents to urgent care today with her caregiver who complains of patient having increased frequency of urination that began 2 weeks ago.  Caregiver photo states that patient is also felt more fatigued and has been sleeping more lately.  Patient was seen in the emergency room in January of this year for acute urinary tract infection, she was treated with Keflex, urine culture revealed E. coli.  Patient also has a history of senile dementia, patient is unable to provide me any meaningful history today.  EMR reviewed, patient has reported history of prolonged QT in 2015, most recent EKG performed in March 2023 revealed a normal QT.  The history is provided by the patient.  Past Medical History:  Diagnosis Date   Abnormality of gait    Adenomatous polyp of colon 2002   7mm   Allergic rhinitis    Anxiety    Anxiety and depression    Chronic back pain    Dementia without behavioral disturbance (HCC)    Depression    Diabetes (HCC)    Diverticulosis of colon    Dry eye syndrome    Dysphagia    Dysthymic disorder    Fall    Fibromyalgia    GERD (gastroesophageal reflux disease)    H/O hiatal hernia    History of adverse drug reaction    History of cerebrovascular disease 09/24/2014   History of recurrent UTIs    Hypertension, benign    Irritable bowel syndrome    Low back pain syndrome    Memory loss    Mitral valve prolapse    Paroxysmal A-fib (HCC)    Peripheral neuropathy    "both feet and legs"   Physical deconditioning    Sjogren's syndrome (HCC)    Therapeutic opioid-induced constipation (OIC)    Thyroid nodule    Urinary incontinence    Patient Active Problem List   Diagnosis Date Noted   Subarachnoid hemorrhage (HCC) 04/23/2020   Diabetic neuropathy (HCC)  01/28/2019   Diabetes mellitus due to underlying condition, uncontrolled 09/04/2017   History of rectal bleeding 09/04/2017   A-fib (HCC) 04/23/2017   Diabetes mellitus type 2 in nonobese (HCC) 04/23/2017   Sjogren's disease (HCC) 04/03/2016   Primary osteoarthritis of both hands 04/03/2016   High risk medication use 04/03/2016   Senile dementia, with behavioral disturbance (HCC) 08/16/2015   Swelling 08/16/2015   Diastolic dysfunction 07/08/2015   Hypertonicity, bladder 07/08/2015   Arterial hypotension    Bradycardia 06/29/2015   Chronic lower back pain 06/29/2015   PAF (paroxysmal atrial fibrillation) (HCC) 04/18/2015   Dehydration 04/18/2015   Dementia without behavioral disturbance (HCC) 04/18/2015   TIA (transient ischemic attack) 12/17/2014   Chest pain 12/16/2014   Acute encephalopathy 12/04/2014   Fall    AKI (acute kidney injury) (HCC) 12/03/2014   History of cerebrovascular disease 09/24/2014   Falls frequently 07/28/2014   Infarction of parietal lobe (HCC)    Mild dementia (HCC)    CVA (cerebral infarction) 07/26/2014   Atrial fibrillation with RVR (HCC) 07/03/2014   Constipation 04/15/2014   Mixed stress and urge urinary incontinence 11/03/2013   Therapeutic opioid induced constipation 11/03/2013   Hemorrhoid 11/03/2013   Low back pain associated with a spinal disorder other than  radiculopathy or spinal stenosis 11/03/2013   Protein-calorie malnutrition, severe (HCC) 07/22/2013   UTI (lower urinary tract infection) 07/20/2013   Prolonged QT interval 07/20/2013   Osteopenia 07/17/2013   Palpitations 06/30/2013   Hereditary and idiopathic peripheral neuropathy 05/22/2013   Abnormality of gait 12/05/2012   Headache(784.0) 09/05/2012   Multinodular thyroid 01/15/2012   Neck pain 01/15/2012   Hypercholesterolemia 07/08/2010   Tear film insufficiency 07/06/2009   SJOGREN'S SYNDROME 07/06/2009   Urge urinary incontinence 01/06/2009   COLONIC POLYPS, ADENOMATOUS,  HX OF 04/15/2008   MITRAL VALVE PROLAPSE 11/05/2007   ANXIETY DEPRESSION 07/02/2007   Mononeuritis 07/02/2007   HYPERTENSION, BENIGN 07/02/2007   GERD 07/02/2007   Irritable bowel syndrome 07/02/2007   Fibromyalgia 07/02/2007   Past Surgical History:  Procedure Laterality Date   ABDOMINAL HYSTERECTOMY  1967   APPENDECTOMY     CARDIAC CATHETERIZATION  02/17/2003   normal L main, LAD free of disease, Cfx free of disease, RCA free of disease (Dr. Mervyn Skeeters. [place])   CATARACT EXTRACTION, BILATERAL     CHOLECYSTECTOMY  2000   COLONOSCOPY W/ BIOPSIES     multiple   DENTAL SURGERY     multiple tooth extractions   ESOPHAGOGASTRODUODENOSCOPY (EGD) WITH ESOPHAGEAL DILATION N/A 08/23/2012   Procedure: ESOPHAGOGASTRODUODENOSCOPY (EGD) WITH ESOPHAGEAL DILATION;  Surgeon: Rachael Fee, MD;  Location: [facility];  Service: Endoscopy;  Laterality: N/A;   NASAL SEPTUM SURGERY  1980   NM MYOCAR PERF WALL MOTION  2003   persantine - normal static and dynamic study w/apical thinning and presvered LV function, no ischemia   SINUS EXPLORATION     ossifiying fibroma   TEMPOROMANDIBULAR JOINT SURGERY  1986   Dr. Warren Danes   TRANSTHORACIC ECHOCARDIOGRAM  2001   mild LVH, normal LV   OB History   No obstetric history on file.    Home Medications    Prior to Admission medications   Medication Sig Start Date End Date Taking? Authorizing Provider  acetaminophen (TYLENOL) 500 MG tablet Take 1,000 mg by mouth 2 (two) times daily.    [provider]  ALPRAZolam Prudy Feeler) 0.25 MG tablet Take 1 tablet by mouth twice daily as needed for anxiety 11/25/20   Ngetich, Dinah C, NP  antiseptic oral rinse (BIOTENE) LIQD 15 mLs by Mouth Rinse route as needed.    [provider]  Capsaicin-Menthol-Methyl Sal (CAPSAICIN-METHYL SAL-MENTHOL) 0.025-1-12 % CREA Apply 1 application topically 2 (two) times daily. 09/11/18   Ngetich, Dinah C, NP  carboxymethylcellulose (REFRESH PLUS) 0.5 % SOLN Place 1 drop into  both eyes in the morning, at noon, in the evening, and at bedtime.    [provider]  cephALEXin (KEFLEX) 500 MG capsule Take 1 capsule (500 mg total) by mouth 3 (three) times daily. 02/14/21   Cathren Laine, MD  CRANBERRY PO Take 2 tablets by mouth daily. AZO    [provider]  diltiazem (CARDIZEM CD) 180 MG 24 hr capsule Take 1 capsule (180 mg total) by mouth daily. 08/09/20   Hilty, Lisette Abu, MD  DULoxetine (CYMBALTA) 30 MG capsule Take 1 capsule (30 mg total) by mouth daily. Take along with 60 mg to = 90 mg for back pain and depression 01/17/21   Sharon Seller, NP  DULoxetine (CYMBALTA) 60 MG capsule Take 1 capsule by mouth once daily 12/20/20   Ngetich, Dinah C, NP  furosemide (LASIX) 40 MG tablet Take 1 tablet by mouth once daily 04/01/21   Ngetich, Dinah C, NP  glucose  blood (ONETOUCH VERIO) test strip Use to test blood sugar daily. Dx:E08.65 05/12/20   [place], Amy E, NP  Lancets Micro Thin 33G MISC Use to test blood sugar daily. Dx: E08.65 01/19/21   Ngetich, Dinah C, NP  lansoprazole (PREVACID) 30 MG capsule TAKE 1 CAPSULE BY MOUTH ONCE DAILY AT NOON 05/10/21   Ngetich, Dinah C, NP  linaclotide (LINZESS) 290 MCG CAPS capsule TAKE 1 CAPSULE BY MOUTH EVERY DAY AT BEDTIME 04/21/21   Ngetich, Dinah C, NP  LINZESS 290 MCG CAPS capsule TAKE 1 CAPSULE BY MOUTH EVERY DAY AT BEDTIME 06/21/20   Ngetich, Dinah C, NP  loratadine (CLARITIN) 10 MG tablet Take 1 tablet (10 mg total) by mouth daily. 05/12/20   Octavia Heir, NP  memantine (NAMENDA) 10 MG tablet Take 1 tablet by mouth twice daily 12/20/20   Ngetich, Dinah C, NP  mirabegron ER (MYRBETRIQ) 50 MG TB24 tablet Take 1 tablet by mouth once daily 11/18/20   Ngetich, Dinah C, NP  pilocarpine (SALAGEN) 5 MG tablet Take 1 tablet by mouth twice daily 10/22/20   Ngetich, Dinah C, NP  polyethylene glycol (MIRALAX / GLYCOLAX) packet Take 17 g by mouth daily as needed for mild constipation (constipation). Mix with 8 oz. water or juice    [provider]  potassium chloride SA (KLOR-CON) 20 MEQ tablet Take 1 tablet by mouth once daily 07/22/20   Ngetich, Dinah C, NP  pregabalin (LYRICA) 100 MG capsule Take 1 capsule by mouth twice daily 02/18/21   Ngetich, Dinah C, NP  senna (SENOKOT) 8.6 MG tablet Take 1 tablet (8.6 mg total) by mouth daily. 07/19/20   [place], Amy E, NP  senna-docusate (SENNA S) 8.6-50 MG tablet Take 1 tablet by mouth daily. 05/12/20   F[place], Amy E, NP  sitaGLIPtin (JANUVIA) 100 MG tablet Take 1 tablet (100 mg total) by mouth daily. 12/23/20   Ngetich, Donalee Citrin, NP   Family History Family History  Problem Relation Age of Onset   Heart disease Father        heart attack   Pneumonia Father    Heart attack Mother    Hypertension Mother    Colon cancer Sister    Kidney disease Daughter    Asthma Daughter    Arthritis Daughter 59       osteo,   Heart disease Son 60       stage 3 CHF(Diastolic /Systolic)   Throat cancer Brother    Hypertension Maternal Grandmother    Social History Social History   Tobacco Use   Smoking status: Former   Smokeless tobacco: Never   Tobacco comments:    Quit at age 33   Vaping Use   Vaping Use: Never used  Substance Use Topics   Alcohol use: No   Drug use: Never   Allergies   Banana, Codeine, Klonopin [clonazepam], Meperidine hcl, Norflex [orphenadrine citrate], Oxycodone-acetaminophen, Propoxyphene hcl, Zoloft [sertraline hcl], Doxycycline, Naproxen, Penicillins, Phenothiazines, Stelazine, Sulfamethoxazole-trimethoprim, Tolectin [tolmetin sodium], and Tramadol  Review of Systems Review of Systems Pertinent findings noted in history of present illness.   Physical Exam Triage Vital Signs ED Triage Vitals  Enc Vitals Group     BP 12/10/20 0827 (!) 147/82     Pulse Rate 12/10/20 0827 72     Resp 12/10/20 0827 18     Temp 12/10/20 0827 98.3 F (36.8 C)     Temp Source 12/10/20 0827 Oral     SpO2 12/10/20 0827 98 %  Weight --      Height --      Head  Circumference --      Peak Flow --      Pain Score 12/10/20 0826 5     Pain Loc --      Pain Edu? --      Excl. in GC? --   No data found.  Updated Vital Signs BP (!) 148/78 (BP Location: Left Arm)   Pulse 80   Temp 97.8 F (36.6 C) (Oral)   Resp 18   SpO2 93%   Physical Exam Vitals and nursing note reviewed.  Constitutional:      General: She is awake. She is not in acute distress.    Appearance: Normal appearance. She is well-developed and well-groomed. She is not ill-appearing.  HENT:     Head: Normocephalic and atraumatic.  Eyes:     General: Lids are normal.        Right eye: No discharge.        Left eye: No discharge.     Extraocular Movements: Extraocular movements intact.     Conjunctiva/sclera: Conjunctivae normal.     Right eye: Right conjunctiva is not injected.     Left eye: Left conjunctiva is not injected.  Neck:     Trachea: Trachea and phonation normal.  Cardiovascular:     Rate and Rhythm: Normal rate and regular rhythm.     Pulses: Normal pulses.     Heart sounds: Normal heart sounds. No murmur heard.   No friction rub. No gallop.  Pulmonary:     Effort: Pulmonary effort is normal. No accessory muscle usage, prolonged expiration or respiratory distress.     Breath sounds: Normal breath sounds. No stridor, decreased air movement or transmitted upper airway sounds. No decreased breath sounds, wheezing, rhonchi or rales.  Chest:     Chest wall: No tenderness.  Abdominal:     General: Abdomen is flat. Bowel sounds are normal. There is no distension.     Palpations: Abdomen is soft. There is no mass.     Tenderness: There is no abdominal tenderness. There is no right CVA tenderness, left CVA tenderness, guarding or rebound.     Hernia: No hernia is present.  Musculoskeletal:        General: Normal range of motion.     Cervical back: Normal range of motion and neck supple. Normal range of motion.  Lymphadenopathy:     Cervical: No cervical adenopathy.   Skin:    General: Skin is warm and dry.     Findings: No erythema or rash.  Neurological:     General: No focal deficit present.     Mental Status: She is alert. She is disoriented and confused.  Psychiatric:        Attention and Perception: She is attentive.        Mood and Affect: Mood normal.        Speech: Speech normal.        Behavior: Behavior normal. Behavior is cooperative.        Thought Content: Thought content normal.        Cognition and Memory: Cognition is impaired. Memory is impaired. She exhibits impaired recent memory and impaired remote memory.    Visual Acuity Right Eye Distance:   Left Eye Distance:   Bilateral Distance:    Right Eye Near:   Left Eye Near:    Bilateral Near:     UC Couse /  Diagnostics / Procedures:    EKG  Radiology No results found.  Procedures Procedures (including critical care time)  UC Diagnoses / Final Clinical Impressions(s)   I have reviewed the triage vital signs and the nursing notes.  Pertinent labs & imaging results that were available during my care of the patient were reviewed by me and considered in my medical decision making (see chart for details).    Final diagnoses:  Increased frequency of urination  Acute urinary tract infection     Urine dip today was positive for nitrites.  Urine culture will be performed per our protocol.   Caregiver was advised to begin antibiotics now due to findings on urine dip. Caregiver advised that they will be contacted with results of the urine culture and that treatment will be provided as indicated based on results. Return precautions advised.  ED Prescriptions     Medication Sig Dispense Auth. Provider   sulfamethoxazole-trimethoprim (BACTRIM DS) 800-160 MG tablet Take 1 tablet by mouth 2 (two) times daily for 5 days. 10 tablet Theadora Rama Scales, PA-C      PDMP not reviewed this encounter.  Pending results:  Labs Reviewed  POCT URINALYSIS DIP (MANUAL ENTRY) -  Abnormal; Notable for the following components:      Result Value   Clarity, UA cloudy (*)    Nitrite, UA Positive (*)    Leukocytes, UA Small (1+) (*)    All other components within normal limits  URINE CULTURE    Medications Ordered in UC: Medications - No data to display  Disposition Upon Discharge:  Condition: stable for discharge home  Patient presented with concern for an acute illness with associated systemic symptoms and significant discomfort requiring urgent management. In my opinion, this is a condition that a prudent lay person (someone who possesses an average knowledge of health and medicine) may potentially expect to result in complications if not addressed urgently such as respiratory distress, impairment of bodily function or dysfunction of bodily organs.   As such, the patient has been evaluated and assessed, work-up was performed and treatment was provided in alignment with urgent care protocols and evidence based medicine.  Patient/parent/caregiver has been advised that the patient may require follow up for further testing and/or treatment if the symptoms continue in spite of treatment, as clinically indicated and appropriate.  Routine symptom specific, illness specific and/or disease specific instructions were discussed with the patient and/or caregiver at length.  Prevention strategies for avoiding STD exposure were also discussed.  The patient will follow up with their current PCP if and as advised. If the patient does not currently have a PCP we will assist them in obtaining one.   The patient may need specialty follow up if the symptoms continue, in spite of conservative treatment and management, for further workup, evaluation, consultation and treatment as clinically indicated and appropriate.  Patient/parent/caregiver verbalized understanding and agreement of plan as discussed.  All questions were addressed during visit.  Please see discharge instructions below for  further details of plan.  Discharge Instructions:   Discharge Instructions      The urinalysis that we performed in the clinic today was abnormal.  Urine culture will be performed per our protocol.  The result of the urine culture will be available in the next 3 to 5 days and will be posted to your MyChart account.  If there is an abnormal finding, you will be contacted by phone and advised of further treatment recommendations, if any.  You were advised to begin antibiotics today because you are having active symptoms of a urinary tract infection.  It is very important that you take all doses exactly as prescribed.  Incomplete antibiotic therapy can cause worsening urinary tract infection that can become aggressive, reach the level of your kidneys causing kidney infection and possible hospitalization.   Thank you for visiting urgent care today.  I appreciate the opportunity to participate in your care.      This office note has been dictated using Teaching laboratory technician.  Unfortunately, and despite my best efforts, this method of dictation can sometimes lead to occasional typographical or grammatical errors.  I apologize in advance if this occurs.      Theadora Rama Scales, PA-C 05/20/21 1419    Theadora Rama Scales, PA-C 05/20/21 1420

## 2021-05-20 NOTE — Discharge Instructions (Addendum)
The urinalysis that we performed in the clinic today was abnormal.  Urine culture will be performed per our protocol.  The result of the urine culture will be available in the next 3 to 5 days and will be posted to your MyChart account.  If there is an abnormal finding, you will be contacted by phone and advised of further treatment recommendations, if any. ?  ?You were advised to begin antibiotics today because you are having active symptoms of a urinary tract infection.  It is very important that you take all doses exactly as prescribed.  Incomplete antibiotic therapy can cause worsening urinary tract infection that can become aggressive, reach the level of your kidneys causing kidney infection and possible hospitalization. ?  ?Thank you for visiting urgent care today.  I appreciate the opportunity to participate in your care.  ? ?

## 2021-05-22 LAB — URINE CULTURE: Culture: >=100000 — AB

## 2021-05-24 ENCOUNTER — Other Ambulatory Visit: Payer: Self-pay | Admitting: Family

## 2021-05-24 DIAGNOSIS — N3281 Overactive bladder: Secondary | ICD-10-CM

## 2021-05-24 MED ORDER — CEPHALEXIN 500 MG PO CAPS: 500.0000 mg | ORAL_CAPSULE | Freq: Two times a day (BID) | ORAL | 0 refills | Status: AC

## 2021-05-24 NOTE — Telephone Encounter (Signed)
Confirmed on medication list:  ?Myrbetriq 50 1/d & Alprazolam 0.'25mg'$  1 BID prn last filled 10.13.22 ? ?10.27.22 last OV, next OV 5.5.23 ?

## 2021-06-15 ENCOUNTER — Other Ambulatory Visit: Payer: Medicare Other

## 2021-06-16 ENCOUNTER — Other Ambulatory Visit: Payer: Medicare Other

## 2021-06-16 DIAGNOSIS — I10 Essential (primary) hypertension: Secondary | ICD-10-CM | POA: Diagnosis not present

## 2021-06-16 DIAGNOSIS — E1140 Type 2 diabetes mellitus with diabetic neuropathy, unspecified: Secondary | ICD-10-CM | POA: Diagnosis not present

## 2021-06-17 ENCOUNTER — Encounter: Payer: Self-pay | Admitting: Family

## 2021-06-17 ENCOUNTER — Ambulatory Visit (INDEPENDENT_AMBULATORY_CARE_PROVIDER_SITE_OTHER): Payer: Medicare Other | Admitting: Family

## 2021-06-17 VITALS — BP 90/70 | HR 64 | Temp 97.6°F | Resp 16 | Ht 63.0 in

## 2021-06-17 DIAGNOSIS — R350 Frequency of micturition: Secondary | ICD-10-CM

## 2021-06-17 DIAGNOSIS — G63 Polyneuropathy in diseases classified elsewhere: Secondary | ICD-10-CM

## 2021-06-17 DIAGNOSIS — R300 Dysuria: Secondary | ICD-10-CM | POA: Diagnosis not present

## 2021-06-17 DIAGNOSIS — I10 Essential (primary) hypertension: Secondary | ICD-10-CM

## 2021-06-17 DIAGNOSIS — R109 Unspecified abdominal pain: Secondary | ICD-10-CM | POA: Diagnosis not present

## 2021-06-17 DIAGNOSIS — M3501 Sicca syndrome with keratoconjunctivitis: Secondary | ICD-10-CM

## 2021-06-17 DIAGNOSIS — I5032 Chronic diastolic (congestive) heart failure: Secondary | ICD-10-CM

## 2021-06-17 DIAGNOSIS — E785 Hyperlipidemia, unspecified: Secondary | ICD-10-CM

## 2021-06-17 DIAGNOSIS — K582 Mixed irritable bowel syndrome: Secondary | ICD-10-CM

## 2021-06-17 DIAGNOSIS — F0150 Vascular dementia without behavioral disturbance: Secondary | ICD-10-CM

## 2021-06-17 DIAGNOSIS — M549 Dorsalgia, unspecified: Secondary | ICD-10-CM | POA: Diagnosis not present

## 2021-06-17 DIAGNOSIS — K2100 Gastro-esophageal reflux disease with esophagitis, without bleeding: Secondary | ICD-10-CM

## 2021-06-17 DIAGNOSIS — E43 Unspecified severe protein-calorie malnutrition: Secondary | ICD-10-CM

## 2021-06-17 DIAGNOSIS — E1140 Type 2 diabetes mellitus with diabetic neuropathy, unspecified: Secondary | ICD-10-CM

## 2021-06-17 DIAGNOSIS — I480 Paroxysmal atrial fibrillation: Secondary | ICD-10-CM

## 2021-06-17 LAB — CBC WITH DIFFERENTIAL/PLATELET
Absolute Monocytes: 501 cells/uL (ref 200–950)
Basophils Absolute: 20 cells/uL (ref 0–200)
Basophils Relative: 0.3 %
Eosinophils Absolute: 189 cells/uL (ref 15–500)
Eosinophils Relative: 2.9 %
HCT: 38.3 % (ref 35.0–45.0)
Hemoglobin: 12.8 g/dL (ref 11.7–15.5)
Lymphs Abs: 1404 cells/uL (ref 850–3900)
MCH: 31.9 pg (ref 27.0–33.0)
MCHC: 33.4 g/dL (ref 32.0–36.0)
MCV: 95.5 fL (ref 80.0–100.0)
MPV: 11.7 fL (ref 7.5–12.5)
Monocytes Relative: 7.7 %
Neutro Abs: 4388 cells/uL (ref 1500–7800)
Neutrophils Relative %: 67.5 %
Platelets: 200 10*3/uL (ref 140–400)
RBC: 4.01 10*6/uL (ref 3.80–5.10)
RDW: 12.1 % (ref 11.0–15.0)
Total Lymphocyte: 21.6 %
WBC: 6.5 10*3/uL (ref 3.8–10.8)

## 2021-06-17 LAB — POCT URINALYSIS DIPSTICK
Bilirubin, UA: NEGATIVE
Glucose, UA: NEGATIVE
Ketones, UA: POSITIVE
Nitrite, UA: POSITIVE
Protein, UA: NEGATIVE
Spec Grav, UA: 1.025 (ref 1.010–1.025)
Urobilinogen, UA: NEGATIVE E.U./dL — AB
pH, UA: 5 (ref 5.0–8.0)

## 2021-06-17 LAB — LIPID PANEL
Cholesterol: 173 mg/dL (ref <200)
HDL: 48 mg/dL — ABNORMAL LOW (ref >=50)
LDL Cholesterol (Calc): 103 mg/dL (calc) — ABNORMAL HIGH
Non-HDL Cholesterol (Calc): 125 mg/dL (calc) (ref <130)
Total CHOL/HDL Ratio: 3.6 (calc) (ref <5.0)
Triglycerides: 121 mg/dL (ref <150)

## 2021-06-17 LAB — HEMOGLOBIN A1C
Hgb A1c MFr Bld: 6.1 % of total Hgb — ABNORMAL HIGH (ref <5.7)
Mean Plasma Glucose: 128 mg/dL
eAG (mmol/L): 7.1 mmol/L

## 2021-06-17 LAB — BASIC METABOLIC PANEL WITH GFR
BUN: 23 mg/dL (ref 7–25)
CO2: 30 mmol/L (ref 20–32)
Calcium: 10.3 mg/dL (ref 8.6–10.4)
Chloride: 99 mmol/L (ref 98–110)
Creat: 0.88 mg/dL (ref 0.60–0.95)
Glucose, Bld: 130 mg/dL (ref 65–139)
Potassium: 4.5 mmol/L (ref 3.5–5.3)
Sodium: 138 mmol/L (ref 135–146)
eGFR: 63 mL/min/{1.73_m2} (ref >=60)

## 2021-06-17 MED ORDER — NYSTATIN 100000 UNIT/GM EX CREA: 1.0000 "application " | TOPICAL_CREAM | Freq: Two times a day (BID) | CUTANEOUS | 0 refills | Status: DC

## 2021-06-17 NOTE — Progress Notes (Signed)
This ? ?Provider: [name] FNP-C  ? ?Ariana [name], Ariana Bucks, NP ? ?Patient Care Team: ?Trenita Hulme, Ariana Bucks, NP as PCP - General (Family Medicine) ?Pixie Casino, MD as PCP - Cardiology (Cardiology) ?Noralee Space, MD as Consulting Physician (Pulmonary Disease) ?Maisie Fus, MD as Consulting Physician (Obstetrics and Gynecology) ?Pixie Casino, MD as Consulting Physician (Cardiology) ?Monna Fam, MD as Consulting Physician (Ophthalmology) ?Kathrynn Ducking, MD (Inactive) as Consulting Physician (Neurology) ?Franchot Gallo, MD as Consulting Physician (Urology) ?Lavonna Monarch, MD as Consulting Physician (Dermatology) ?Gatha Mayer, MD as Consulting Physician (Gastroenterology) ?Estill Dooms as Social Worker ?Yvonna Alanis, NP as Nurse Practitioner (Adult Health Nurse Practitioner) ? ?Extended Emergency Contact Information ?Primary Emergency Contact: Ariana [name],[name] ?Address: [address] ?         [place], [place] [address] [place] ?Mobile Phone: [phone] ?Relation: Ariana [name] ?Secondary Emergency Contact: Ariana [name],Danny ? [place] ?Mobile Phone: [phone] ?Relation: Other ? ?Code Status:  Full Code  ?Goals of care: Advanced Directive information ? ?  06/17/2021  ?  2:27 PM  ?Advanced Directives  ?Does Patient Have a Medical Advance Directive? Yes  ?Type of Paramedic of [place];Living will  ?Does patient want to make changes to medical advance directive? No - Patient declined  ?Copy of [name] in Chart? No - copy requested  ? ? ? ?Chief Complaint  ?Patient presents with  ? Medical Management of Chronic Issues  ?  6 month follow up.  ? Lab work  ?  Review recent labs   ? Health Maintenance  ?  Discuss the need for Eye exam, and Foot exam.  ? Immunizations  ?  Discuss the need for Shingrix vaccine, and Covid booster.  ? Concerns  ?  Caretaker Ariana [name] & Ariana [name] states that patient is having symptoms  of UTI. Symptoms include dysuria, urinary frequency, back pain, stomach pain.  ? ? ?HPI:  ?Pt is a 88 y.o. female seen today for 6 months follow-up for medical management of chronic diseases.  She has some medical history of hypertension, controlled type 2 diabetes with neuropathy, A-fib, fibromyalgia, anxiety and depression, GERD, chronic low back pain, irritable bowel syndrome, peripheral neuropathy, urine incontinence, history of C VA, vascular dementia without any behavioral disturbance among other conditions ? ?Recent lab results reviewed and discussed with patient and caregiver.  Labs unremarkable except LDL cholesterol 103, hemoglobin A1c stable 6.1 previous 6.4 ? ?Due for annual eye exam but care giver states she declined follow up with Ophthalmology. ?Due for annual foot exam.Follow ups with [facility]  ? ?Caregiver Ariana [name] and Ariana [name] states that patient is having symptoms of urinary tract infection complains of dysuria urinary frequency, abdominal pain and lower back pain.  Of note she was seen in the ED 05/20/2021 and 02/14/2021 for acute urinary tract infection. ? ?No fall episode or weight changes. ? ?Health maintenance: ?Due for Shingrix and COVID booster vaccine ? ? ?Past Medical History:  ?Diagnosis Date  ? Abnormality of gait   ? Adenomatous polyp of colon 2002  ? 19m  ? Allergic rhinitis   ? Anxiety   ? Anxiety and depression   ? Chronic back pain   ? Dementia without behavioral disturbance (HDeclo   ? Depression   ? Diabetes ([place]   ? Diverticulosis of colon   ? Dry eye syndrome   ? Dysphagia   ? Dysthymic disorder   ? Fall   ? Fibromyalgia   ?  GERD (gastroesophageal reflux disease)   ? H/O hiatal hernia   ? History of adverse drug reaction   ? History of cerebrovascular disease 09/24/2014  ? History of recurrent UTIs   ? Hypertension, benign   ? Irritable bowel syndrome   ? Low back pain syndrome   ? Memory loss   ? Mitral valve prolapse   ? Paroxysmal A-fib ([place])   ? Peripheral neuropathy   ? "both feet and  legs"  ? Physical deconditioning   ? Sjogren's syndrome ([place])   ? Therapeutic opioid-induced constipation (OIC)   ? Thyroid nodule   ? Urinary incontinence   ? ?Past Surgical History:  ?Procedure Laterality Date  ? ABDOMINAL HYSTERECTOMY  1967  ? APPENDECTOMY    ? CARDIAC CATHETERIZATION  02/17/2003  ? normal L main, LAD free of disease, Cfx free of disease, RCA free of disease (Dr. Rockne Menghini)  ? CATARACT EXTRACTION, BILATERAL    ? CHOLECYSTECTOMY  2000  ? COLONOSCOPY W/ BIOPSIES    ? multiple  ? DENTAL SURGERY    ? multiple tooth extractions  ? ESOPHAGOGASTRODUODENOSCOPY (EGD) WITH ESOPHAGEAL DILATION N/A 08/23/2012  ? Procedure: ESOPHAGOGASTRODUODENOSCOPY (EGD) WITH ESOPHAGEAL DILATION;  Surgeon: Milus Banister, MD;  Location: [facility];  Service: Endoscopy;  Laterality: N/A;  ? NASAL SEPTUM SURGERY  1980  ? NM MYOCAR PERF WALL MOTION  2003  ? persantine - normal static and dynamic study w/apical thinning and presvered LV function, no ischemia  ? SINUS EXPLORATION    ? ossifiying fibroma  ? [place]  ? Dr. Terence Lux  ? TRANSTHORACIC ECHOCARDIOGRAM  2001  ? mild LVH, normal LV  ? ? ?Allergies  ?Allergen Reactions  ? Banana Nausea And Vomiting  ? Codeine Nausea Only  ?  unless given with Phenergan  ? Klonopin [Clonazepam] Other (See Comments)  ?  Causes hallucination ?  ? Meperidine Hcl Nausea Only  ?  unless given with Phenergan  ? Norflex [Orphenadrine Citrate] Nausea Only  ?  Unless given with Phenergan  ? Oxycodone-Acetaminophen Nausea Only  ?  unless given with phenergan  ? Propoxyphene Hcl Nausea Only  ?  unless given with phenergan  ? Zoloft [Sertraline Hcl] Other (See Comments)  ?  Caused lethargy  ? Doxycycline Other (See Comments)  ?  Unknown reaction  ? Naproxen Other (See Comments)  ?  Unknown reaction  ? Penicillins Other (See Comments)  ?  Unknown reaction ?Has patient had a PCN reaction causing immediate rash, facial/tongue/throat swelling, SOB or lightheadedness with  hypotension: Unknown ?Has patient had a PCN reaction causing severe rash involving mucus membranes or skin necrosis: Unknown ?Has patient had a PCN reaction that required hospitalization: pt was in the hospital at time of reaction ?Has patient had a PCN reaction occurring within the last 10 years: Unknown ?If all of the above answers are "NO", then may proceed with Cephalos  ? Phenothiazines Other (See Comments)  ?  Unknown reaction  ? Stelazine Other (See Comments)  ?  Unknown reaction  ? Sulfamethoxazole-Trimethoprim Other (See Comments)  ?  Unknown reaction  ? Tolectin [Tolmetin Sodium] Other (See Comments)  ?  Unknown reaction  ? Tramadol Other (See Comments)  ?  Unknown reaction  ? ? ?Allergies as of 06/17/2021   ? ?   Reactions  ? Banana Nausea And Vomiting  ? Codeine Nausea Only  ? unless given with Phenergan  ? Klonopin [clonazepam] Other (See Comments)  ? Causes hallucination  ?  Meperidine Hcl Nausea Only  ? unless given with Phenergan  ? Norflex [orphenadrine Citrate] Nausea Only  ? Unless given with Phenergan  ? Oxycodone-acetaminophen Nausea Only  ? unless given with phenergan  ? Propoxyphene Hcl Nausea Only  ? unless given with phenergan  ? Zoloft [sertraline Hcl] Other (See Comments)  ? Caused lethargy  ? Doxycycline Other (See Comments)  ? Unknown reaction  ? Naproxen Other (See Comments)  ? Unknown reaction  ? Penicillins Other (See Comments)  ? Unknown reaction ?Has patient had a PCN reaction causing immediate rash, facial/tongue/throat swelling, SOB or lightheadedness with hypotension: Unknown ?Has patient had a PCN reaction causing severe rash involving mucus membranes or skin necrosis: Unknown ?Has patient had a PCN reaction that required hospitalization: pt was in the hospital at time of reaction ?Has patient had a PCN reaction occurring within the last 10 years: Unknown ?If all of the above answers are "NO", then may proceed with Cephalos  ? Phenothiazines Other (See Comments)  ? Unknown reaction   ? Stelazine Other (See Comments)  ? Unknown reaction  ? Sulfamethoxazole-trimethoprim Other (See Comments)  ? Unknown reaction  ? Tolectin [tolmetin Sodium] Other (See Comments)  ? Unknown reaction

## 2021-06-19 DIAGNOSIS — I5032 Chronic diastolic (congestive) heart failure: Secondary | ICD-10-CM | POA: Insufficient documentation

## 2021-06-19 DIAGNOSIS — G63 Polyneuropathy in diseases classified elsewhere: Secondary | ICD-10-CM | POA: Insufficient documentation

## 2021-06-19 LAB — URINE CULTURE
MICRO NUMBER:: 13359460
SPECIMEN QUALITY:: ADEQUATE

## 2021-06-20 ENCOUNTER — Other Ambulatory Visit: Payer: Self-pay

## 2021-06-20 DIAGNOSIS — N3091 Cystitis, unspecified with hematuria: Secondary | ICD-10-CM

## 2021-06-20 MED ORDER — NITROFURANTOIN MONOHYD MACRO 100 MG PO CAPS: 100.0000 mg | ORAL_CAPSULE | Freq: Two times a day (BID) | ORAL | 0 refills | Status: AC

## 2021-06-20 MED ORDER — SACCHAROMYCES BOULARDII 250 MG PO CAPS: 250.0000 mg | ORAL_CAPSULE | Freq: Two times a day (BID) | ORAL | 0 refills | Status: DC

## 2021-06-24 ENCOUNTER — Other Ambulatory Visit: Payer: Self-pay | Admitting: Family

## 2021-06-24 DIAGNOSIS — E119 Type 2 diabetes mellitus without complications: Secondary | ICD-10-CM

## 2021-06-24 DIAGNOSIS — G6289 Other specified polyneuropathies: Secondary | ICD-10-CM

## 2021-06-24 MED ORDER — DULOXETINE HCL 30 MG PO CPEP: 30.0000 mg | ORAL_CAPSULE | Freq: Every day | ORAL | 1 refills | Status: DC

## 2021-06-24 NOTE — Telephone Encounter (Signed)
Sidney requested refill.  ?Pended Rx and sent to [facility] for approval due to [place].  ?

## 2021-06-24 NOTE — Telephone Encounter (Signed)
Patient has request refill on medication Alprazolam 0.'25mg'$  last refill 05/24/2021, Januvia 12/23/2020, Memantine '10mg'$  last refilled 12/20/2020. Patient has Non Opioid Contract on file dated 12/17/2020. Patient has upcoming appointment 12/20/2021. Update Contract added to patient appointment notes. Medications pend and sent to PCP Ngetich, Nelda Bucks, NP for approval.  ?

## 2021-06-25 ENCOUNTER — Other Ambulatory Visit: Payer: Self-pay | Admitting: Orthopedic Surgery

## 2021-06-25 DIAGNOSIS — E119 Type 2 diabetes mellitus without complications: Secondary | ICD-10-CM

## 2021-07-18 ENCOUNTER — Other Ambulatory Visit: Payer: Self-pay | Admitting: Family

## 2021-07-18 DIAGNOSIS — G6289 Other specified polyneuropathies: Secondary | ICD-10-CM

## 2021-07-18 DIAGNOSIS — I10 Essential (primary) hypertension: Secondary | ICD-10-CM

## 2021-07-25 ENCOUNTER — Other Ambulatory Visit: Payer: Self-pay | Admitting: Family

## 2021-07-25 DIAGNOSIS — G6289 Other specified polyneuropathies: Secondary | ICD-10-CM

## 2021-07-25 NOTE — Telephone Encounter (Signed)
Rx last refilled on 06/24/2021 (Xanax), treatment agreement on file from 12/08/20

## 2021-07-29 DIAGNOSIS — R300 Dysuria: Secondary | ICD-10-CM | POA: Diagnosis not present

## 2021-07-29 DIAGNOSIS — N3000 Acute cystitis without hematuria: Secondary | ICD-10-CM | POA: Diagnosis not present

## 2021-07-29 DIAGNOSIS — R8271 Bacteriuria: Secondary | ICD-10-CM | POA: Diagnosis not present

## 2021-08-04 ENCOUNTER — Ambulatory Visit: Payer: Medicare Other | Admitting: Podiatry

## 2021-08-11 ENCOUNTER — Other Ambulatory Visit: Payer: Self-pay | Admitting: Family

## 2021-08-11 ENCOUNTER — Other Ambulatory Visit: Payer: Self-pay | Admitting: Internal Medicine

## 2021-08-11 DIAGNOSIS — K219 Gastro-esophageal reflux disease without esophagitis: Secondary | ICD-10-CM

## 2021-08-11 DIAGNOSIS — K2100 Gastro-esophageal reflux disease with esophagitis, without bleeding: Secondary | ICD-10-CM

## 2021-08-18 ENCOUNTER — Ambulatory Visit: Payer: Medicare Other | Admitting: Podiatry

## 2021-08-19 ENCOUNTER — Other Ambulatory Visit: Payer: Self-pay | Admitting: Family

## 2021-08-19 DIAGNOSIS — G6289 Other specified polyneuropathies: Secondary | ICD-10-CM

## 2021-08-19 DIAGNOSIS — M545 Low back pain, unspecified: Secondary | ICD-10-CM

## 2021-08-19 NOTE — Telephone Encounter (Signed)
Patient has request refill on medication Lyrica. Patient last refill dated 02/18/2021. Patient has Non Opioid Contract from 12/17/2020. Patient has upcoming office visit 12/30/2021. Update Contract added to patient appointment note. Medication pend and sent to PCP Ngetich, Nelda Bucks, NP . Please Advise.

## 2021-08-29 ENCOUNTER — Other Ambulatory Visit: Payer: Self-pay | Admitting: Family

## 2021-08-29 DIAGNOSIS — N3281 Overactive bladder: Secondary | ICD-10-CM

## 2021-08-30 ENCOUNTER — Telehealth: Payer: Medicare Other

## 2021-08-30 DIAGNOSIS — B370 Candidal stomatitis: Secondary | ICD-10-CM

## 2021-08-30 MED ORDER — NYSTATIN 100000 UNIT/ML MT SUSP: 5.0000 mL | Freq: Four times a day (QID) | OROMUCOSAL | 0 refills | Status: DC

## 2021-08-30 NOTE — Telephone Encounter (Signed)
Called regarding a medication refill for an antifungal mouthwash for patient for her thrush witch is severe right now. Was not sure if if was the antiseptic oral mouthrinse already on her medication list. So I did not order anything yet. Please let me know how you want to proceed. So I can make the patient aware

## 2021-08-30 NOTE — Telephone Encounter (Signed)
Nystatin suspension swish script send to pharmacy.

## 2021-08-30 NOTE — Addendum Note (Signed)
Addended byMarlowe Sax C on: 08/30/2021 12:52 PM   Modules accepted: Orders

## 2021-09-27 ENCOUNTER — Other Ambulatory Visit: Payer: Self-pay | Admitting: Family

## 2021-09-27 DIAGNOSIS — I10 Essential (primary) hypertension: Secondary | ICD-10-CM

## 2021-09-27 DIAGNOSIS — E119 Type 2 diabetes mellitus without complications: Secondary | ICD-10-CM

## 2021-09-30 ENCOUNTER — Ambulatory Visit (HOSPITAL_BASED_OUTPATIENT_CLINIC_OR_DEPARTMENT_OTHER): Payer: Medicare Other | Admitting: Internal Medicine

## 2021-10-20 ENCOUNTER — Other Ambulatory Visit: Payer: Self-pay | Admitting: Family

## 2021-10-20 DIAGNOSIS — M3501 Sicca syndrome with keratoconjunctivitis: Secondary | ICD-10-CM

## 2021-10-26 ENCOUNTER — Ambulatory Visit: Payer: Medicare Other | Admitting: Podiatry

## 2021-10-31 ENCOUNTER — Other Ambulatory Visit: Payer: Self-pay

## 2021-10-31 DIAGNOSIS — B370 Candidal stomatitis: Secondary | ICD-10-CM

## 2021-10-31 MED ORDER — NYSTATIN 100000 UNIT/ML MT SUSP: 5.0000 mL | Freq: Four times a day (QID) | OROMUCOSAL | 0 refills | Status: DC

## 2021-11-10 ENCOUNTER — Other Ambulatory Visit: Payer: Self-pay | Admitting: Internal Medicine

## 2021-11-21 ENCOUNTER — Encounter: Payer: Self-pay | Admitting: Family

## 2021-11-21 ENCOUNTER — Encounter: Payer: Medicare Other | Admitting: Family

## 2021-11-28 ENCOUNTER — Telehealth: Payer: Medicare Other | Admitting: Adult Health

## 2021-12-04 NOTE — Progress Notes (Signed)
This encounter was created in error - please disregard.

## 2021-12-09 ENCOUNTER — Encounter (HOSPITAL_BASED_OUTPATIENT_CLINIC_OR_DEPARTMENT_OTHER): Payer: Self-pay | Admitting: *Deleted

## 2021-12-09 ENCOUNTER — Emergency Department (HOSPITAL_BASED_OUTPATIENT_CLINIC_OR_DEPARTMENT_OTHER)
Admission: EM | Admit: 2021-12-09 | Discharge: 2021-12-09 | Disposition: A | Discharge status: Home / Self Care | Payer: Medicare Other | Attending: Emergency Medicine | Admitting: Emergency Medicine

## 2021-12-09 ENCOUNTER — Other Ambulatory Visit: Payer: Self-pay

## 2021-12-09 DIAGNOSIS — F0390 Unspecified dementia without behavioral disturbance: Secondary | ICD-10-CM | POA: Insufficient documentation

## 2021-12-09 DIAGNOSIS — L03119 Cellulitis of unspecified part of limb: Secondary | ICD-10-CM

## 2021-12-09 DIAGNOSIS — R21 Rash and other nonspecific skin eruption: Secondary | ICD-10-CM | POA: Insufficient documentation

## 2021-12-09 DIAGNOSIS — N3000 Acute cystitis without hematuria: Secondary | ICD-10-CM | POA: Diagnosis not present

## 2021-12-09 DIAGNOSIS — Z79899 Other long term (current) drug therapy: Secondary | ICD-10-CM | POA: Insufficient documentation

## 2021-12-09 DIAGNOSIS — I10 Essential (primary) hypertension: Secondary | ICD-10-CM | POA: Insufficient documentation

## 2021-12-09 DIAGNOSIS — L03116 Cellulitis of left lower limb: Secondary | ICD-10-CM | POA: Diagnosis not present

## 2021-12-09 DIAGNOSIS — L03115 Cellulitis of right lower limb: Secondary | ICD-10-CM | POA: Insufficient documentation

## 2021-12-09 DIAGNOSIS — R350 Frequency of micturition: Secondary | ICD-10-CM | POA: Diagnosis present

## 2021-12-09 DIAGNOSIS — E119 Type 2 diabetes mellitus without complications: Secondary | ICD-10-CM | POA: Insufficient documentation

## 2021-12-09 LAB — URINALYSIS, ROUTINE W REFLEX MICROSCOPIC
Bilirubin Urine: NEGATIVE
Glucose, UA: NEGATIVE mg/dL
Hgb urine dipstick: NEGATIVE
Ketones, ur: NEGATIVE mg/dL
Nitrite: POSITIVE — AB
Protein, ur: NEGATIVE mg/dL
Specific Gravity, Urine: 1.015 (ref 1.005–1.030)
pH: 6 (ref 5.0–8.0)

## 2021-12-09 LAB — URINALYSIS, MICROSCOPIC (REFLEX)

## 2021-12-09 MED ORDER — CEPHALEXIN 500 MG PO CAPS: 500.0000 mg | ORAL_CAPSULE | Freq: Four times a day (QID) | ORAL | 0 refills | Status: AC

## 2021-12-09 MED ORDER — HYDROCORTISONE 1 % EX CREA: TOPICAL_CREAM | CUTANEOUS | 0 refills | Status: DC

## 2021-12-09 MED ORDER — CEPHALEXIN 250 MG PO CAPS
500.0000 mg | ORAL_CAPSULE | Freq: Once | ORAL | Status: AC
  Administered 2021-12-09: 500 mg via ORAL
  Filled 2021-12-09: qty 2

## 2021-12-09 NOTE — ED Notes (Signed)
Areas noted to her skin scattered.  Itching and with open skin and scabs.  They are noted on the arms and the legs.

## 2021-12-09 NOTE — ED Notes (Signed)
Patient has an area on the left lower leg that has eschar/yellowing and redness above and below the wounds.

## 2021-12-09 NOTE — ED Triage Notes (Signed)
Patient has been picking at her skin and scratching areas for the past 2 weeks.  Patient reports she believes there is a bug with wings that are coming out of the wound. Patient with no reported trauma.  There have been no recent medication changes.  Patient admits to having some burning when she voids.  No reported fevers.  Patient is followed by a dermatologist as well. She is alert and oriented x 4

## 2021-12-09 NOTE — Discharge Instructions (Signed)
You were seen in the emergency department for your rash and urinary frequency.  Your urine did show that you have a UTI.  It is unclear what is causing your rash at this time but does appear that you have a mild skin infection around a few of your lesions due to the itching.  You should take your antibiotics as prescribed to treat your UTI and your skin infection.  You can take your Claritin to help with the itching as well as use a topical steroid cream on the lesions that are not open.  You should follow-up with your primary doctor in the next few days to have your wounds rechecked.  You should return to the emergency department if you are having fevers, repetitive vomiting, streaking redness up your legs, worsening abdominal or back pain or if you have any other new or concerning symptoms.

## 2021-12-09 NOTE — ED Provider Notes (Signed)
[facility] EMERGENCY DEPARTMENT Provider Note   CSN: [id number] Arrival date & time: 12/09/21  1459     History  Chief Complaint  Patient presents with   Skin Problem    Ariana [name] is a 88 y.o. female.  Patient is a 88 year old female with a past medical history of vascular dementia, frequent UTIs, hypertension, A-fib, diabetes presenting to the emergency department with rash as well as urinary frequency.  Patient states over the last 2 weeks she has had an increasing itchy rash to her bilateral legs and a few lesions on her arms.  She states that she is seeing "a bug with wings flying out of the wounds".  She reports that the wound starts small and get bigger with time.  She states that they are very itchy.  She is here with her daughter who denies any fevers or chills, nausea or vomiting.  The patient also reports increased urinary frequency and increased nocturia.  She denies any dysuria.  She denies any abdominal pain.  Patient lives alone and has caretakers and no report of bedbugs have been seen.  No one they know of has a similar rash.  The history is provided by the patient and a relative. History limited by: Dementia.       Home Medications Prior to Admission medications   Medication Sig Start Date End Date Taking? Authorizing Provider  cephALEXin (KEFLEX) 500 MG capsule Take 1 capsule (500 mg total) by mouth 4 (four) times daily for 7 days. 12/09/21 12/16/21 Yes Kingsley, Jenner, DO  glucose blood (ONETOUCH VERIO) test strip USE 1 STRIP TO CHECK GLUCOSE ONCE DAILY 06/27/21   Ngetich, Dinah C, NP  hydrocortisone cream 1 % Apply to affected area 2 times daily 12/09/21  Yes Maylon Peppers, [place] K, DO  acetaminophen (TYLENOL) 500 MG tablet Take 1,000 mg by mouth 2 (two) times daily.    [provider]  ALPRAZolam Duanne Moron) 0.25 MG tablet Take 1 tablet by mouth twice daily as needed for anxiety 07/25/21   Ngetich, Dinah C, NP  antiseptic oral rinse (BIOTENE)  LIQD 15 mLs by Mouth Rinse route as needed.    [provider]  Capsaicin-Menthol-Methyl Sal (CAPSAICIN-METHYL SAL-MENTHOL) 0.025-1-12 % CREA Apply 1 application topically 2 (two) times daily. 09/11/18   Ngetich, Dinah C, NP  carboxymethylcellulose (REFRESH PLUS) 0.5 % SOLN Place 1 drop into both eyes in the morning, at noon, in the evening, and at bedtime.    [provider]  CRANBERRY PO Take 2 tablets by mouth daily. AZO    [provider]  diltiazem (CARDIZEM CD) 180 MG 24 hr capsule Take 1 capsule (180 mg total) by mouth daily. SCHEDULE OFFICE VISIT FOR FUTURE REFILLS. 11/10/21   Hilty, Nadean Corwin, MD  DULoxetine (CYMBALTA) 30 MG capsule Take 1 capsule (30 mg total) by mouth daily. Take along with 60 mg to = 90 mg for back pain and depression 06/24/21   Ngetich, Dinah C, NP  DULoxetine (CYMBALTA) 60 MG capsule TAKE 1 CAPSULE BY MOUTH ONCE DAILY (TAKE  WITH  30  MG  CAPSULE)  FOR  BACK  PAIN  AND  DEPRESSION 07/25/21   Ngetich, Dinah C, NP  furosemide (LASIX) 40 MG tablet Take 1 tablet by mouth once daily 09/27/21   Ngetich, Dinah C, NP  Lancets Micro Thin 33G MISC Use to test blood sugar daily. Dx: E08.65 01/19/21   Ngetich, Dinah C, NP  lansoprazole (PREVACID) 30 MG capsule TAKE 1 CAPSULE  BY MOUTH ONCE DAILY AT NOON 08/11/21   Ngetich, Dinah C, NP  linaclotide (LINZESS) 290 MCG CAPS capsule TAKE 1 CAPSULE BY MOUTH EVERY DAY AT BEDTIME 04/21/21   Ngetich, Dinah C, NP  LINZESS 290 MCG CAPS capsule TAKE 1 CAPSULE BY MOUTH EVERY DAY AT BEDTIME 06/21/20   Ngetich, Dinah C, NP  loratadine (CLARITIN) 10 MG tablet Take 1 tablet (10 mg total) by mouth daily. 05/12/20   Yvonna Alanis, NP  memantine (NAMENDA) 10 MG tablet Take 1 tablet by mouth twice daily 07/18/21   Ngetich, Dinah C, NP  mirabegron ER (MYRBETRIQ) 50 MG TB24 tablet Take 1 tablet by mouth once daily 08/29/21   Ngetich, Dinah C, NP  nystatin (MYCOSTATIN) 100000 UNIT/ML suspension Use as directed 5 mLs (500,000 Units total) in the  mouth or throat 4 (four) times daily. 10/31/21   Ngetich, Dinah C, NP  nystatin cream (MYCOSTATIN) Apply 1 application. topically 2 (two) times daily. Peri-area 06/17/21   Ngetich, Dinah C, NP  pilocarpine (SALAGEN) 5 MG tablet Take 1 tablet by mouth twice daily 10/20/21   Ngetich, Dinah C, NP  polyethylene glycol (MIRALAX / GLYCOLAX) packet Take 17 g by mouth daily as needed for mild constipation (constipation). Mix with 8 oz. water or juice    [provider]  potassium chloride SA (KLOR-CON M) 20 MEQ tablet TAKE 1  BY MOUTH ONCE DAILY 07/18/21   Ngetich, Dinah C, NP  pregabalin (LYRICA) 100 MG capsule Take 1 capsule by mouth twice daily 08/19/21   Ngetich, Dinah C, NP  saccharomyces boulardii (FLORASTOR) 250 MG capsule Take 1 capsule (250 mg total) by mouth 2 (two) times daily. 06/20/21   Ngetich, Dinah C, NP  senna (SENOKOT) 8.6 MG tablet Take 1 tablet (8.6 mg total) by mouth daily. 07/19/20   Fargo, Amy E, NP  senna-docusate (SENNA S) 8.6-50 MG tablet Take 1 tablet by mouth daily. 05/12/20   Yvonna Alanis, NP  sitaGLIPtin (JANUVIA) 100 MG tablet Take 1 tablet by mouth once daily 09/27/21   Ngetich, Dinah C, NP      Allergies    Banana, Codeine, Klonopin [clonazepam], Meperidine hcl, Norflex [orphenadrine citrate], Oxycodone-acetaminophen, Propoxyphene hcl, Zoloft [sertraline hcl], Doxycycline, Naproxen, Penicillins, Phenothiazines, Stelazine, Sulfamethoxazole-trimethoprim, Tolectin [tolmetin sodium], and Tramadol    Review of Systems   Review of Systems  Physical Exam Updated Vital Signs BP 136/62   Pulse 80   Temp 98 F (36.7 C)   Resp 18   SpO2 96%  Physical Exam Vitals and nursing note reviewed.  Constitutional:      General: She is not in acute distress.    Appearance: Normal appearance.  HENT:     Head: Normocephalic and atraumatic.     Nose: Nose normal.     Mouth/Throat:     Mouth: Mucous membranes are moist.     Pharynx: Oropharynx is clear.  Eyes:     Extraocular  Movements: Extraocular movements intact.     Conjunctiva/sclera: Conjunctivae normal.  Cardiovascular:     Rate and Rhythm: Normal rate.  Pulmonary:     Effort: Pulmonary effort is normal.  Abdominal:     General: Abdomen is flat.     Palpations: Abdomen is soft.     Tenderness: There is no abdominal tenderness. There is no right CVA tenderness or left CVA tenderness.  Musculoskeletal:        General: Normal range of motion.     Cervical back: Normal range of motion and  neck supple.     Right lower leg: No edema.     Left lower leg: No edema.  Skin:    General: Skin is warm and dry.     Comments: Multiple approximately 1 cm excoriated round superficial ulcerations on bilateral lower extremities and few on bilateral upper extremities, multiple lesions with surrounding erythema and warmth, no drainage, no streaking redness  Neurological:     Mental Status: She is alert. Mental status is at baseline.  Psychiatric:        Mood and Affect: Mood normal.        Behavior: Behavior normal.     ED Results / Procedures / Treatments   Labs (all labs ordered are listed, but only abnormal results are displayed) Labs Reviewed  URINALYSIS, ROUTINE W REFLEX MICROSCOPIC - Abnormal; Notable for the following components:      Result Value   Nitrite POSITIVE (*)    Leukocytes,Ua TRACE (*)    All other components within normal limits  URINALYSIS, MICROSCOPIC (REFLEX) - Abnormal; Notable for the following components:   Bacteria, UA MANY (*)    All other components within normal limits    EKG None  Radiology No results found.  Procedures Procedures    Medications Ordered in ED Medications  cephALEXin (KEFLEX) capsule 500 mg (500 mg Oral Given 12/09/21 1925)    ED Course/ Medical Decision Making/ A&P                           Medical Decision Making This patient presents to the ED with chief complaint(s) of urinary frequency and rash with pertinent past medical history of vascular  dementia, frequent UTIs, A-fib, hypertension, diabetes which further complicates the presenting complaint. The complaint involves an extensive differential diagnosis and also carries with it a high risk of complications and morbidity.    The differential diagnosis includes UTI, pyelonephritis unlikely as no CVA tenderness, she is not febrile or tachycardic making sepsis unlikely, she has no ketones in her urine making DKA unlikely, considering cellulitis for her rash, she has no palpable fluctuance making abscess unlikely, possible contact dermatitis or bug bites  Additional history obtained: Additional history obtained from family Records reviewed previous admission documents  ED Course and Reassessment: She was initially evaluated in triage and had urine studies performed that did show nitrite positive UTI.  She has tolerated Keflex in the past and will be treated with Keflex.  She does have multiple superficial ulcer type rash with surrounding erythema and warmth concerning for superimposed infection.  Her rash is itchy concerning for possible initial contact dermatitis or bug bite type of rash.  She was recommended a hydrocortisone cream for the nonopen wounds and to continue to take her Claritin.  She was recommended primary care follow-up for wound recheck.  Independent labs interpretation:  The following labs were independently interpreted: UA positive for UTI  Independent visualization of imaging: N/A  Consultation: - Consulted or discussed management/test interpretation w/ external professional: N/A  Consideration for admission or further workup: Patient has no emergent condition requiring admission or further work up at this time and is stable for discharge with primary care follow up Social Determinants of health: N/A    Amount and/or Complexity of Data Reviewed Labs: ordered.  Risk Prescription drug management.          Final Clinical Impression(s) / ED  Diagnoses Final diagnoses:  Rash  Cellulitis of lower extremity, unspecified laterality  Acute cystitis without hematuria    Rx / DC Orders ED Discharge Orders          Ordered    cephALEXin (KEFLEX) 500 MG capsule  4 times daily        12/09/21 1928    hydrocortisone cream 1 %        12/09/21 1928              Kemper Durie, DO 12/09/21 1929

## 2021-12-13 ENCOUNTER — Telehealth: Payer: Self-pay | Admitting: Internal Medicine

## 2021-12-13 MED ORDER — DILTIAZEM HCL ER COATED BEADS 180 MG PO CP24: 180.0000 mg | ORAL_CAPSULE | Freq: Every day | ORAL | 0 refills | Status: DC

## 2021-12-13 NOTE — Telephone Encounter (Signed)
*  STAT* If patient is at the pharmacy, call can be transferred to refill team.   1. Which medications need to be refilled? (please list name of each medication and dose if known) diltiazem (CARDIZEM CD) 180 MG 24 hr capsule  2. Which pharmacy/location (including street and city if local pharmacy) is medication to be sent to? [place], [place]  3. Do they need a 30 day or 90 day supply?  90 day supply  Patient's daughter states the patient has been completely out of medication for the past 2 days. Patient is scheduled for 1/26 with Dr. Debara Pickett.

## 2021-12-13 NOTE — Telephone Encounter (Signed)
Pt c/o swelling: STAT is pt has developed SOB within 24 hours  How much weight have you gained and in what time span? No weight gain, per daughter  If swelling, where is the swelling located?  Right ankle  Are you currently taking a fluid pill?  Furosemide once daily at 2:00 PM  Are you currently SOB?    Do you have a log of your daily weights (if so, list)?  No log available  Have you gained 3 pounds in a day or 5 pounds in a week?  No   Have you traveled recently?  No, and does not elevate her legs throughout the day

## 2021-12-13 NOTE — Telephone Encounter (Signed)
Ariana [name] with daughter who reported patient was in the ED on 10/27 due to breaks in the skin. She was positive for UTI and cellulitis and placed on Keflex. Recommended filing patient's nails so she doesn't "dig into her skin. Daughter stated patient will not let anyone file her nails. Recommended getting hand mitts.  Daughter is concerned about swelling in the patient's rt ankle joint. Skin color is equal bilaterally. Denies pain. Recommended patient elevate legs when sitting to see if swelling resolves. Recommended patient see her PCP.

## 2021-12-19 ENCOUNTER — Telehealth: Payer: Self-pay

## 2021-12-19 NOTE — Telephone Encounter (Signed)
Recommend calamine anti-itch lotion apply as needed.

## 2021-12-19 NOTE — Telephone Encounter (Signed)
Patient daughter Almond Lint called stating that she cancelled appointment with our office for tomorrow because patient is refusing to come. She sates that the patient only wants to go to the ER. She says that patient is complaining of bugs biting her. Daughter says that the scabs were found ot to be bug bite, but instead just scabd from where her mother gets to itching and she scratches until she bleeds and pull the scabs off. She really doesn't know what to do. They are without an aide right now. She would like to know what can she do.  Message routed to [name], NP

## 2021-12-20 ENCOUNTER — Ambulatory Visit: Payer: Medicare Other | Admitting: Family

## 2021-12-20 NOTE — Telephone Encounter (Signed)
Spoke with patient's daughter and she verbalized her understanding.

## 2021-12-22 ENCOUNTER — Ambulatory Visit: Payer: Medicare Other | Admitting: Internal Medicine

## 2022-01-13 ENCOUNTER — Other Ambulatory Visit: Payer: Self-pay | Admitting: Internal Medicine

## 2022-01-17 ENCOUNTER — Encounter: Payer: Medicare Other | Admitting: Family

## 2022-01-17 NOTE — Progress Notes (Signed)
  This encounter was created in error - please disregard. No show 

## 2022-01-19 ENCOUNTER — Encounter: Payer: Medicare Other | Admitting: Family

## 2022-01-19 ENCOUNTER — Other Ambulatory Visit: Payer: Self-pay | Admitting: Family

## 2022-01-19 DIAGNOSIS — I10 Essential (primary) hypertension: Secondary | ICD-10-CM

## 2022-01-19 DIAGNOSIS — M3501 Sicca syndrome with keratoconjunctivitis: Secondary | ICD-10-CM

## 2022-01-19 DIAGNOSIS — G6289 Other specified polyneuropathies: Secondary | ICD-10-CM

## 2022-01-19 NOTE — Progress Notes (Signed)
  This encounter was created in error - please disregard. No show 

## 2022-01-27 ENCOUNTER — Other Ambulatory Visit: Payer: Self-pay | Admitting: Family

## 2022-01-27 DIAGNOSIS — G6289 Other specified polyneuropathies: Secondary | ICD-10-CM

## 2022-01-27 NOTE — Telephone Encounter (Signed)
Message forwarded to administrative staff to schedule patient for appointment. "6 month follow up" for patient .

## 2022-01-27 NOTE — Telephone Encounter (Signed)
Patient has request refill on medications Xanax 0.'25mg'$ , and Cymbalta '60mg'$ . Patient medications last refilled 07/25/2021. Patient has Non Opioid Contract on file dated 12/17/2020. Patient has No upcoming appointments. Medications have Allergy Contraindications. Medication pend and sent to PCP Ngetich, Nelda Bucks, NP for approval.

## 2022-01-27 NOTE — Telephone Encounter (Signed)
Please schedule follow up appointment and to sign Xanax use Contract prior to next medication refill. Xanax will not be refilled next month if no appointment/contract.

## 2022-02-03 ENCOUNTER — Encounter: Payer: Self-pay | Admitting: Family

## 2022-02-03 ENCOUNTER — Ambulatory Visit (INDEPENDENT_AMBULATORY_CARE_PROVIDER_SITE_OTHER): Payer: Medicare Other | Admitting: Family

## 2022-02-03 VITALS — BP 130/84 | HR 74 | Temp 97.5°F | Resp 16 | Ht 63.0 in | Wt 138.6 lb

## 2022-02-03 DIAGNOSIS — K2100 Gastro-esophageal reflux disease with esophagitis, without bleeding: Secondary | ICD-10-CM

## 2022-02-03 DIAGNOSIS — E785 Hyperlipidemia, unspecified: Secondary | ICD-10-CM | POA: Diagnosis not present

## 2022-02-03 DIAGNOSIS — E1140 Type 2 diabetes mellitus with diabetic neuropathy, unspecified: Secondary | ICD-10-CM

## 2022-02-03 DIAGNOSIS — Z23 Encounter for immunization: Secondary | ICD-10-CM | POA: Diagnosis not present

## 2022-02-03 DIAGNOSIS — I10 Essential (primary) hypertension: Secondary | ICD-10-CM

## 2022-02-03 DIAGNOSIS — I480 Paroxysmal atrial fibrillation: Secondary | ICD-10-CM

## 2022-02-03 NOTE — Progress Notes (Signed)
Provider: [name] FNP-C   Sadhana Frater, Nelda Bucks, NP  Patient Care Team: Shakoya Gilmore, Nelda Bucks, NP as PCP - General (Family Medicine) Pixie Casino, MD as PCP - Cardiology (Cardiology) Noralee Space, MD as Consulting Physician (Pulmonary Disease) Maisie Fus, MD (Inactive) as Consulting Physician (Obstetrics and Gynecology) Pixie Casino, MD as Consulting Physician (Cardiology) Monna Fam, MD as Consulting Physician (Ophthalmology) Kathrynn Ducking, MD (Inactive) as Consulting Physician (Neurology) Franchot Gallo, MD as Consulting Physician (Urology) Lavonna Monarch, MD (Inactive) as Consulting Physician (Dermatology) Gatha Mayer, MD as Consulting Physician (Gastroenterology) Estill Dooms as Social Worker Yvonna Alanis, NP as Nurse Practitioner (Adult Health Nurse Practitioner)  Extended Emergency Contact Information Primary Emergency Contact: Era Skeen Address: [address]          [place], [place] [address] Johnnette Litter of [place] Phone: [phone] Relation: Daughter Secondary Emergency Contact: Arvin Collard [place] of [place] Mobile Phone: [phone] Relation: Other  Code Status:  Full Code  Goals of care: Advanced Directive information    02/03/2022    1:44 PM  Advanced Directives  Does Patient Have a Medical Advance Directive? Yes  Type of Paramedic of [place];Living will  Does patient want to make changes to medical advance directive? No - Patient declined  Copy of [place] in Chart? No - copy requested     Chief Complaint  Patient presents with   Medical Management of Chronic Issues    6 month follow up.    Health Maintenance    Discuss the need for AWV, Eye exam, Foot exam, and Hemoglobin A1C.   Immunizations    Discuss the need for Shingrix vaccine, Influenza vaccine, and Covid Booster.    Concerns     HIGH FALL RISK    HPI:  Pt is a 88  y.o. female seen today for 6 months for medical management of chronic diseases.Has some medical history of essential hypertension, type 2 diabetes with diabetic neuropathy, A-fib, hyperlipidemia, GERD, dementia without behavioral disturbances, anxiety and depression, fibromyalgia among other conditions. She denies any issues this visit. She is here with a new caregiver. No home CBG for review.  Due for annual eye and foot exam.  Also due for annual fasting blood work. Agrees to get her influenza vaccine today.  Made aware to get her Shingrix and COVID booster vaccine at the pharmacy. Has upcoming appointment with cardiology on January 26th 2024.   Past Medical History:  Diagnosis Date   Abnormality of gait    Adenomatous polyp of colon 2002   25m   Allergic rhinitis    Anxiety    Anxiety and depression    Chronic back pain    Dementia without behavioral disturbance (HCC)    Depression    Diabetes ([place]    Diverticulosis of colon    Dry eye syndrome    Dysphagia    Dysthymic disorder    Fall    Fibromyalgia    GERD (gastroesophageal reflux disease)    H/O hiatal hernia    History of adverse drug reaction    History of cerebrovascular disease 09/24/2014   History of recurrent UTIs    Hypertension, benign    Irritable bowel syndrome    Low back pain syndrome    Memory loss    Mitral valve prolapse    Paroxysmal A-fib (HCC)    Peripheral neuropathy    "both feet and legs"  Physical deconditioning    Sjogren's syndrome (HCC)    Therapeutic opioid-induced constipation (OIC)    Thyroid nodule    Urinary incontinence    Past Surgical History:  Procedure Laterality Date   ABDOMINAL HYSTERECTOMY  1967   APPENDECTOMY     CARDIAC CATHETERIZATION  02/17/2003   normal L main, LAD free of disease, Cfx free of disease, RCA free of disease (Dr. Loni Muse. Little)   CATARACT EXTRACTION, BILATERAL     CHOLECYSTECTOMY  2000   COLONOSCOPY W/ BIOPSIES     multiple   DENTAL SURGERY      multiple tooth extractions   ESOPHAGOGASTRODUODENOSCOPY (EGD) WITH ESOPHAGEAL DILATION N/A 08/23/2012   Procedure: ESOPHAGOGASTRODUODENOSCOPY (EGD) WITH ESOPHAGEAL DILATION;  Surgeon: Milus Banister, MD;  Location: [facility];  Service: Endoscopy;  Laterality: N/A;   NASAL SEPTUM SURGERY  1980   NM MYOCAR PERF WALL MOTION  2003   persantine - normal static and dynamic study w/apical thinning and presvered LV function, no ischemia   SINUS EXPLORATION     ossifiying fibroma   TEMPOROMANDIBULAR JOINT SURGERY  1986   Dr. Terence Lux   TRANSTHORACIC ECHOCARDIOGRAM  2001   mild LVH, normal LV    Allergies  Allergen Reactions   Banana Nausea And Vomiting   Codeine Nausea Only    unless given with Phenergan   Klonopin [Clonazepam] Other (See Comments)    Causes hallucination    Meperidine Hcl Nausea Only    unless given with Phenergan   Norflex [Orphenadrine Citrate] Nausea Only    Unless given with Phenergan   Oxycodone-Acetaminophen Nausea Only    unless given with phenergan   Propoxyphene Hcl Nausea Only    unless given with phenergan   Zoloft [Sertraline Hcl] Other (See Comments)    Caused lethargy   Doxycycline Other (See Comments)    Unknown reaction   Naproxen Other (See Comments)    Unknown reaction   Penicillins Other (See Comments)    Unknown reaction Has patient had a PCN reaction causing immediate rash, facial/tongue/throat swelling, SOB or lightheadedness with hypotension: Unknown Has patient had a PCN reaction causing severe rash involving mucus membranes or skin necrosis: Unknown Has patient had a PCN reaction that required hospitalization: pt was in the hospital at time of reaction Has patient had a PCN reaction occurring within the last 10 years: Unknown If all of the above answers are "NO", then may proceed with Cephalos   Phenothiazines Other (See Comments)    Unknown reaction   Stelazine Other (See Comments)    Unknown reaction    Sulfamethoxazole-Trimethoprim Other (See Comments)    Unknown reaction   Tolectin [Tolmetin Sodium] Other (See Comments)    Unknown reaction   Tramadol Other (See Comments)    Unknown reaction    Allergies as of 02/03/2022       Reactions   Banana Nausea And Vomiting   Codeine Nausea Only   unless given with Phenergan   Klonopin [clonazepam] Other (See Comments)   Causes hallucination   Meperidine Hcl Nausea Only   unless given with Phenergan   Norflex [orphenadrine Citrate] Nausea Only   Unless given with Phenergan   Oxycodone-acetaminophen Nausea Only   unless given with phenergan   Propoxyphene Hcl Nausea Only   unless given with phenergan   Zoloft [sertraline Hcl] Other (See Comments)   Caused lethargy   Doxycycline Other (See Comments)   Unknown reaction   Naproxen Other (See Comments)   Unknown reaction  Penicillins Other (See Comments)   Unknown reaction Has patient had a PCN reaction causing immediate rash, facial/tongue/throat swelling, SOB or lightheadedness with hypotension: Unknown Has patient had a PCN reaction causing severe rash involving mucus membranes or skin necrosis: Unknown Has patient had a PCN reaction that required hospitalization: pt was in the hospital at time of reaction Has patient had a PCN reaction occurring within the last 10 years: Unknown If all of the above answers are "NO", then may proceed with Cephalos   Phenothiazines Other (See Comments)   Unknown reaction   Stelazine Other (See Comments)   Unknown reaction   Sulfamethoxazole-trimethoprim Other (See Comments)   Unknown reaction   Tolectin [tolmetin Sodium] Other (See Comments)   Unknown reaction   Tramadol Other (See Comments)   Unknown reaction        Medication List        Accurate as of February 03, 2022  1:51 PM. If you have any questions, ask your nurse or doctor.          acetaminophen 500 MG tablet Commonly known as: TYLENOL Take 1,000 mg by mouth 2 (two)  times daily.   ALPRAZolam 0.25 MG tablet Commonly known as: XANAX Take 1 tablet by mouth twice daily as needed for anxiety   antiseptic oral rinse Liqd 15 mLs by Mouth Rinse route as needed.   capsaicin-methyl sal-menthol 0.025-1-12 % Crea Generic drug: Capsaicin-Menthol-Methyl Sal Apply 1 application topically 2 (two) times daily.   carboxymethylcellulose 0.5 % Soln Commonly known as: REFRESH PLUS Place 1 drop into both eyes in the morning, at noon, in the evening, and at bedtime.   CRANBERRY PO Take 2 tablets by mouth daily. AZO   diltiazem 180 MG 24 hr capsule Commonly known as: CARDIZEM CD Take 1 capsule by mouth once daily   DULoxetine 30 MG capsule Commonly known as: CYMBALTA TAKE 1 CAPSULE BY MOUTH ONCE DAILY. TAKE ALONG WITH 60MG TO= 90ME FOR BACK PAIN AND DEPRESSION   DULoxetine 60 MG capsule Commonly known as: CYMBALTA TAKE 1 CAPSULE BY MOUTH ONCE DAILY (TAKE WITH 30MG CAPSULE) FOR BACK PAIN AND DEPRESSION   furosemide 40 MG tablet Commonly known as: LASIX Take 1 tablet by mouth once daily   hydrocortisone cream 1 % Apply to affected area 2 times daily   Januvia 100 MG tablet Generic drug: sitaGLIPtin Take 1 tablet by mouth once daily   Lancets Micro Thin 33G Misc Use to test blood sugar daily. Dx: E08.65   lansoprazole 30 MG capsule Commonly known as: PREVACID TAKE 1 CAPSULE BY MOUTH ONCE DAILY AT NOON   Linzess 290 MCG Caps capsule Generic drug: linaclotide TAKE 1 CAPSULE BY MOUTH EVERY DAY AT BEDTIME   Linzess 290 MCG Caps capsule Generic drug: linaclotide TAKE 1 CAPSULE BY MOUTH EVERY DAY AT BEDTIME   loratadine 10 MG tablet Commonly known as: CLARITIN Take 1 tablet (10 mg total) by mouth daily.   memantine 10 MG tablet Commonly known as: NAMENDA Take 1 tablet by mouth twice daily   Myrbetriq 50 MG Tb24 tablet Generic drug: mirabegron ER Take 1 tablet by mouth once daily   nystatin 100000 UNIT/ML suspension Commonly known as:  MYCOSTATIN Use as directed 5 mLs (500,000 Units total) in the mouth or throat 4 (four) times daily.   nystatin cream Commonly known as: MYCOSTATIN Apply 1 application. topically 2 (two) times daily. Peri-area   OneTouch Verio test strip Generic drug: glucose blood USE 1 STRIP TO CHECK GLUCOSE ONCE  DAILY   pilocarpine 5 MG tablet Commonly known as: SALAGEN Take 1 tablet by mouth twice daily   polyethylene glycol 17 g packet Commonly known as: MIRALAX / GLYCOLAX Take 17 g by mouth daily as needed for mild constipation (constipation). Mix with 8 oz. water or juice   potassium chloride SA 20 MEQ tablet Commonly known as: KLOR-CON M Take 1 tablet by mouth once daily   pregabalin 100 MG capsule Commonly known as: LYRICA Take 1 capsule by mouth twice daily   saccharomyces boulardii 250 MG capsule Commonly known as: Florastor Take 1 capsule (250 mg total) by mouth 2 (two) times daily.   senna 8.6 MG tablet Commonly known as: SENOKOT Take 1 tablet (8.6 mg total) by mouth daily.   senna-docusate 8.6-50 MG tablet Commonly known as: Senna S Take 1 tablet by mouth daily.        Review of Systems  Constitutional:  Negative for appetite change, chills, fatigue, fever and unexpected weight change.  HENT:  Negative for congestion, dental problem, ear discharge, ear pain, facial swelling, hearing loss, nosebleeds, postnasal drip, rhinorrhea, sinus pressure, sinus pain, sneezing, sore throat, tinnitus and trouble swallowing.   Eyes:  Negative for pain, discharge, redness, itching and visual disturbance.  Respiratory:  Negative for cough, chest tightness, shortness of breath and wheezing.   Cardiovascular:  Negative for chest pain, palpitations and leg swelling.  Gastrointestinal:  Negative for abdominal distention, abdominal pain, blood in stool, constipation, diarrhea, nausea and vomiting.  Endocrine: Negative for cold intolerance, heat intolerance, polydipsia, polyphagia and  polyuria.  Genitourinary:  Negative for difficulty urinating, dysuria, flank pain, frequency and urgency.  Musculoskeletal:  Positive for arthralgias and gait problem. Negative for back pain, joint swelling, myalgias, neck pain and neck stiffness.  Skin:  Negative for color change, pallor, rash and wound.  Neurological:  Negative for dizziness, syncope, speech difficulty, weakness, light-headedness, numbness and headaches.  Hematological:  Does not bruise/bleed easily.  Psychiatric/Behavioral:  Negative for agitation, behavioral problems, confusion, hallucinations, self-injury, sleep disturbance and suicidal ideas. The patient is not nervous/anxious.        Memory loss     Immunization History  Administered Date(s) Administered   Fluad Quad(high Dose 65+) 11/08/2018, 11/24/2019, 02/03/2022   Influenza Split 12/26/2010, 01/15/2012   Influenza Whole 12/30/2008, 11/15/2009   Influenza, High Dose Seasonal PF 12/21/2016, 11/05/2017   Influenza,inj,Quad PF,6+ Mos 02/04/2013, 11/03/2013, 12/05/2014, 12/13/2015   Moderna Sars-Covid-2 Vaccination 04/17/2019, 05/15/2019, 12/15/2019   Pneumococcal Conjugate-13 06/12/2014   Pneumococcal Polysaccharide-23 02/15/2009, 12/05/2014   Tdap 12/08/2013   Zoster, Live 01/29/2012   Pertinent  Health Maintenance Due  Topic Date Due   OPHTHALMOLOGY EXAM  Never done   FOOT EXAM  01/28/2020   HEMOGLOBIN A1C  12/17/2021   INFLUENZA VACCINE  Completed   DEXA SCAN  Completed      02/14/2021   11:33 AM 05/20/2021    1:38 PM 06/17/2021    2:27 PM 12/09/2021    3:34 PM 02/03/2022    1:44 PM  Fall Risk  Falls in the past year?   0  1  Was there an injury with Fall?   0  1  Fall Risk Category Calculator   0  3  Fall Risk Category   Low  High  Patient Fall Risk Level Low fall risk Low fall risk Low fall risk Moderate fall risk High fall risk  Patient at Risk for Falls Due to   No Fall Risks  History of fall(s);Impaired  balance/gait;Impaired mobility  Fall risk  Follow up   Falls evaluation completed  Falls evaluation completed;Education provided;Falls prevention discussed   Functional Status Survey:    Vitals:   02/03/22 1342  BP: 130/84  Pulse: 74  Resp: 16  Temp: (!) 97.5 F (36.4 C)  SpO2: 94%  Weight: 138 lb 9.6 oz (62.9 kg)  Height: _0  (1.6 m)   Body mass index is 24.55 kg/m. Physical Exam Vitals reviewed.  Constitutional:      General: She is not in acute distress.    Appearance: Normal appearance. She is normal weight. She is not ill-appearing or diaphoretic.  HENT:     Head: Normocephalic.     Right Ear: Tympanic membrane, ear canal and external ear normal. There is no impacted cerumen.     Left Ear: Tympanic membrane, ear canal and external ear normal. There is no impacted cerumen.     Nose: Nose normal. No congestion or rhinorrhea.     Mouth/Throat:     Mouth: Mucous membranes are moist.     Pharynx: Oropharynx is clear. No oropharyngeal exudate or posterior oropharyngeal erythema.  Eyes:     General: No scleral icterus.       Right eye: No discharge.        Left eye: No discharge.     Extraocular Movements: Extraocular movements intact.     Conjunctiva/sclera: Conjunctivae normal.     Pupils: Pupils are equal, round, and reactive to light.  Neck:     Vascular: No carotid bruit.  Cardiovascular:     Rate and Rhythm: Normal rate and regular rhythm.     Pulses: Normal pulses.     Heart sounds: Normal heart sounds. No murmur heard.    No friction rub. No gallop.  Pulmonary:     Effort: Pulmonary effort is normal. No respiratory distress.     Breath sounds: Normal breath sounds. No wheezing, rhonchi or rales.  Chest:     Chest wall: No tenderness.  Abdominal:     General: Bowel sounds are normal. There is no distension.     Palpations: Abdomen is soft. There is no mass.     Tenderness: There is no abdominal tenderness. There is no right CVA tenderness, left CVA tenderness, guarding or rebound.  Musculoskeletal:         General: No swelling or tenderness. Normal range of motion.     Cervical back: Normal range of motion. No rigidity or tenderness.     Right lower leg: No edema.     Left lower leg: No edema.  Lymphadenopathy:     Cervical: No cervical adenopathy.  Skin:    General: Skin is warm and dry.     Coloration: Skin is not pale.     Findings: No bruising, erythema, lesion or rash.  Neurological:     Mental Status: She is alert and oriented to person, place, and time.     Cranial Nerves: No cranial nerve deficit.     Sensory: No sensory deficit.     Motor: No weakness.     Coordination: Coordination normal.     Gait: Gait abnormal.  Psychiatric:        Mood and Affect: Mood normal.        Speech: Speech normal.        Behavior: Behavior normal.        Thought Content: Thought content normal.        Cognition and Memory: Memory is impaired.  Judgment: Judgment normal.    Labs reviewed: Recent Labs    02/14/21 1240 06/16/21 1054  NA 137 138  K 4.2 4.5  CL 102 99  CO2 26 30  GLUCOSE 124* 130  BUN 21 23  CREATININE 0.75 0.88  CALCIUM 9.6 10.3   Recent Labs    02/14/21 1240  AST 26  ALT 37  ALKPHOS 104  BILITOT 0.6  PROT 7.7  ALBUMIN 4.0   Recent Labs    02/14/21 1240 06/16/21 1054  WBC 5.4 6.5  NEUTROABS  --  4,388  HGB 12.8 12.8  HCT 37.7 38.3  MCV 92.9 95.5  PLT 190 200   Lab Results  Component Value Date   TSH 1.35 09/04/2017   Lab Results  Component Value Date   HGBA1C 6.1 (H) 06/16/2021   Lab Results  Component Value Date   CHOL 173 06/16/2021   HDL 48 (L) 06/16/2021   LDLCALC 103 (H) 06/16/2021   LDLDIRECT 65.7 07/08/2010   TRIG 121 06/16/2021   CHOLHDL 3.6 06/16/2021    Significant Diagnostic Results in last 30 days:  No results found.  Assessment/Plan 1. Need for influenza vaccination Afebrile  Flut shot administered by CMA no acute reaction reported.  - Flu Vaccine QUAD High Dose(Fluad)  2. Essential hypertension Blood  pressure well-controlled -Continue on diltiazem and furosemide - TSH - BMP with eGFR([facility]) - CBC with Differential/Platelet  3. Controlled type 2 diabetes mellitus with diabetic neuropathy, without long-term current use of insulin (HCC) Lab Results  Component Value Date   HGBA1C 6.1 (H) 06/16/2021   No home CBG for review -Continue on Sitagliptin -Advised to follow-up with ophthalmology and podiatry as scheduled for annual eye and foot exam. - Hemoglobin A1c - Lipid panel  4. PAF (paroxysmal atrial fibrillation) (HCC) Heart rate well-controlled -Continue on diltiazem Not on anticoagulants due to her advanced age and high risk for falls 5. Hyperlipidemia LDL goal <100 Previous LDL not at goal 103  - continue on dietary modification  - Lipid panel  6. Gastroesophageal reflux disease with esophagitis without hemorrhage Symptoms controlled. H/H stable.No tarry or black stool  - advised to avoid eating meals late in the evening and to avoid aggravating foods and spices. - continue on Prevacid  - CBC with Differential/Platelet  Family/ staff Communication: Reviewed plan of care with patient and care giver verbalized understanding   Labs/tests ordered:  - CBC with Differential/Platelet - CMP with eGFR([facility]) - TSH - Hgb A1C - Lipid panel Next Appointment : Return in about 6 months (around 08/05/2022) for medical mangement of chronic issues.Sandrea Hughs, NP

## 2022-02-04 LAB — CBC WITH DIFFERENTIAL/PLATELET
Absolute Monocytes: 522 cells/uL (ref 200–950)
Basophils Absolute: 17 cells/uL (ref 0–200)
Basophils Relative: 0.3 %
Eosinophils Absolute: 209 cells/uL (ref 15–500)
Eosinophils Relative: 3.6 %
HCT: 38.3 % (ref 35.0–45.0)
Hemoglobin: 12.8 g/dL (ref 11.7–15.5)
Lymphs Abs: 1433 cells/uL (ref 850–3900)
MCH: 31.5 pg (ref 27.0–33.0)
MCHC: 33.4 g/dL (ref 32.0–36.0)
MCV: 94.3 fL (ref 80.0–100.0)
MPV: 11.8 fL (ref 7.5–12.5)
Monocytes Relative: 9 %
Neutro Abs: 3619 cells/uL (ref 1500–7800)
Neutrophils Relative %: 62.4 %
Platelets: 189 10*3/uL (ref 140–400)
RBC: 4.06 10*6/uL (ref 3.80–5.10)
RDW: 11.7 % (ref 11.0–15.0)
Total Lymphocyte: 24.7 %
WBC: 5.8 10*3/uL (ref 3.8–10.8)

## 2022-02-04 LAB — HEMOGLOBIN A1C
Hgb A1c MFr Bld: 6.2 % of total Hgb — ABNORMAL HIGH (ref <5.7)
Mean Plasma Glucose: 131 mg/dL
eAG (mmol/L): 7.3 mmol/L

## 2022-02-04 LAB — LIPID PANEL
Cholesterol: 160 mg/dL (ref <200)
HDL: 48 mg/dL — ABNORMAL LOW (ref >=50)
LDL Cholesterol (Calc): 82 mg/dL (calc)
Non-HDL Cholesterol (Calc): 112 mg/dL (calc) (ref <130)
Total CHOL/HDL Ratio: 3.3 (calc) (ref <5.0)
Triglycerides: 201 mg/dL — ABNORMAL HIGH (ref <150)

## 2022-02-04 LAB — BASIC METABOLIC PANEL WITH GFR
BUN: 18 mg/dL (ref 7–25)
CO2: 31 mmol/L (ref 20–32)
Calcium: 10 mg/dL (ref 8.6–10.4)
Chloride: 98 mmol/L (ref 98–110)
Creat: 0.75 mg/dL (ref 0.60–0.95)
Glucose, Bld: 86 mg/dL (ref 65–99)
Potassium: 4.3 mmol/L (ref 3.5–5.3)
Sodium: 138 mmol/L (ref 135–146)
eGFR: 76 mL/min/{1.73_m2} (ref >=60)

## 2022-02-04 LAB — TSH: TSH: 2.04 mIU/L (ref 0.40–4.50)

## 2022-02-10 ENCOUNTER — Other Ambulatory Visit: Payer: Self-pay | Admitting: Family

## 2022-02-10 DIAGNOSIS — K219 Gastro-esophageal reflux disease without esophagitis: Secondary | ICD-10-CM

## 2022-02-10 DIAGNOSIS — K2100 Gastro-esophageal reflux disease with esophagitis, without bleeding: Secondary | ICD-10-CM

## 2022-02-22 ENCOUNTER — Other Ambulatory Visit: Payer: Self-pay | Admitting: Family

## 2022-02-22 DIAGNOSIS — N3281 Overactive bladder: Secondary | ICD-10-CM

## 2022-02-23 ENCOUNTER — Other Ambulatory Visit: Payer: Self-pay | Admitting: Family

## 2022-02-23 DIAGNOSIS — G6289 Other specified polyneuropathies: Secondary | ICD-10-CM

## 2022-02-23 DIAGNOSIS — M545 Low back pain, unspecified: Secondary | ICD-10-CM

## 2022-02-23 NOTE — Telephone Encounter (Signed)
Lyrical script send to pharmacy.

## 2022-02-23 NOTE — Telephone Encounter (Signed)
Patient has request refill on medication Lyrica '100mg'$ . Patient medication last refilled 08/19/2021. Patient has Non Opioid Contract on file dated 12/17/2020. Patient has upcoming appointment 08/18/2022. Update Contract added to patient appointment note. Medication pend and sent to PCP Ngetich, Nelda Bucks, NP for approval.

## 2022-02-28 ENCOUNTER — Other Ambulatory Visit: Payer: Self-pay | Admitting: Family

## 2022-03-10 ENCOUNTER — Ambulatory Visit: Payer: Medicare Other | Admitting: Internal Medicine

## 2022-03-17 ENCOUNTER — Other Ambulatory Visit: Payer: Self-pay | Admitting: Family

## 2022-03-17 DIAGNOSIS — E119 Type 2 diabetes mellitus without complications: Secondary | ICD-10-CM

## 2022-03-17 DIAGNOSIS — I10 Essential (primary) hypertension: Secondary | ICD-10-CM

## 2022-03-29 ENCOUNTER — Other Ambulatory Visit: Payer: Self-pay | Admitting: Family

## 2022-03-30 NOTE — Telephone Encounter (Signed)
Patient is requesting a refill of the following medications: Requested Prescriptions   Pending Prescriptions Disp Refills   ALPRAZolam (XANAX) 0.25 MG tablet [Pharmacy Med Name: ALPRAZolam 0.25 MG Oral Tablet] 60 tablet 0    Sig: Take 1 tablet by mouth twice daily as needed for anxiety    Date of last refill: 03/01/2022  Refill amount: 60  Treatment agreement date: Outdated, notation already made on pending appointment for 08/18/2022

## 2022-04-10 ENCOUNTER — Other Ambulatory Visit: Payer: Self-pay | Admitting: Family

## 2022-04-10 DIAGNOSIS — G6289 Other specified polyneuropathies: Secondary | ICD-10-CM

## 2022-04-10 NOTE — Telephone Encounter (Signed)
Patient medication Cymbalta has High Risk Warnings. Medication pend and sent to PCP Ngetich, Nelda Bucks, NP for approval.

## 2022-04-14 ENCOUNTER — Other Ambulatory Visit: Payer: Self-pay | Admitting: Family

## 2022-04-14 ENCOUNTER — Ambulatory Visit: Payer: Medicare Other | Admitting: Podiatry

## 2022-04-14 DIAGNOSIS — G6289 Other specified polyneuropathies: Secondary | ICD-10-CM

## 2022-04-20 ENCOUNTER — Encounter: Payer: Self-pay | Admitting: Podiatry

## 2022-04-20 ENCOUNTER — Ambulatory Visit (INDEPENDENT_AMBULATORY_CARE_PROVIDER_SITE_OTHER): Payer: Medicare Other | Admitting: Podiatry

## 2022-04-20 ENCOUNTER — Other Ambulatory Visit: Payer: Self-pay | Admitting: Family

## 2022-04-20 DIAGNOSIS — M79609 Pain in unspecified limb: Secondary | ICD-10-CM

## 2022-04-20 DIAGNOSIS — E1142 Type 2 diabetes mellitus with diabetic polyneuropathy: Secondary | ICD-10-CM | POA: Diagnosis not present

## 2022-04-20 DIAGNOSIS — B351 Tinea unguium: Secondary | ICD-10-CM

## 2022-04-20 DIAGNOSIS — I10 Essential (primary) hypertension: Secondary | ICD-10-CM

## 2022-04-20 DIAGNOSIS — M3501 Sicca syndrome with keratoconjunctivitis: Secondary | ICD-10-CM

## 2022-04-20 NOTE — Progress Notes (Signed)
  Subjective:  Patient ID: Ariana [name], female    DOB: [birth year],   MRN: [id number]  No chief complaint on file.   89 y.o. female presents for concern of thickened elongated and painful nails that are difficult to trim. Requesting to have them trimmed today. Relates burning and tingling in their feet. Patient is diabetic and last A1c was  Lab Results  Component Value Date   HGBA1C 6.2 (H) 02/03/2022   .   PCP:  Ngetich, Nelda Bucks, NP    . Denies any other pedal complaints. Denies n/v/f/c.   Past Medical History:  Diagnosis Date   Abnormality of gait    Adenomatous polyp of colon 2002   45m   Allergic rhinitis    Anxiety    Anxiety and depression    Chronic back pain    Dementia without behavioral disturbance (HCC)    Depression    Diabetes ([place]    Diverticulosis of colon    Dry eye syndrome    Dysphagia    Dysthymic disorder    Fall    Fibromyalgia    GERD (gastroesophageal reflux disease)    H/O hiatal hernia    History of adverse drug reaction    History of cerebrovascular disease 09/24/2014   History of recurrent UTIs    Hypertension, benign    Irritable bowel syndrome    Low back pain syndrome    Memory loss    Mitral valve prolapse    Paroxysmal A-fib (HCC)    Peripheral neuropathy    "both feet and legs"   Physical deconditioning    Sjogren's syndrome (HCC)    Therapeutic opioid-induced constipation (OIC)    Thyroid nodule    Urinary incontinence     Objective:  Physical Exam: Vascular: DP/PT pulses 2/4 bilateral. CFT <3 seconds. Absent hair growth on digits. Edema noted to bilateral lower extremities. Xerosis noted bilaterally.  Skin. No lacerations or abrasions bilateral feet. Nails 1-5 bilateral  are thickened discolored and elongated with subungual debris.  Musculoskeletal: MMT 5/5 bilateral lower extremities in DF, PF, Inversion and Eversion. Deceased ROM in DF of ankle joint.  Neurological: Sensation intact to light touch. Protective sensation  diminished bilateral.    Assessment:   1. Pain due to onychomycosis of nail   2. Diabetic polyneuropathy associated with type 2 diabetes mellitus ([place]      Plan:  Patient was evaluated and treated and all questions answered. -Discussed and educated patient on diabetic foot care, especially with  regards to the vascular, neurological and musculoskeletal systems.  -Stressed the importance of good glycemic control and the detriment of not  controlling glucose levels in relation to the foot. -Discussed supportive shoes at all times and checking feet regularly.  -Mechanically debrided all nails 1-5 bilateral using sterile nail nipper and filed with dremel without incident  -Answered all patient questions -Patient to return  in 3 months for at risk foot care -Patient advised to call the office if any problems or questions arise in the meantime.   RLorenda Peck DPM

## 2022-05-01 ENCOUNTER — Other Ambulatory Visit: Payer: Self-pay | Admitting: Family

## 2022-05-01 ENCOUNTER — Ambulatory Visit: Payer: Medicare Other | Admitting: Internal Medicine

## 2022-05-02 NOTE — Telephone Encounter (Signed)
Pharmacy requested refill.  Epic LR: 03/31/2022 Contract Date: 12/09/2020 (Note added to upcoming appointment to update)  Pended Rx and sent to [facility] for approval.

## 2022-05-05 ENCOUNTER — Other Ambulatory Visit: Payer: Self-pay | Admitting: Family

## 2022-05-05 DIAGNOSIS — K582 Mixed irritable bowel syndrome: Secondary | ICD-10-CM

## 2022-05-05 NOTE — Telephone Encounter (Signed)
Pharmacy requested refill °Pended Rx and sent to [facility] for approval due to HIGH ALERT Warning. (Dinah out of office) °

## 2022-05-10 ENCOUNTER — Other Ambulatory Visit: Payer: Self-pay | Admitting: Family

## 2022-05-10 DIAGNOSIS — G6289 Other specified polyneuropathies: Secondary | ICD-10-CM

## 2022-05-10 NOTE — Telephone Encounter (Signed)
Pharmacy requested refill.  Pended Rx and sent to [facility] for approval due to HIGH ALERT Warning.  

## 2022-05-11 ENCOUNTER — Other Ambulatory Visit: Payer: Self-pay | Admitting: Family

## 2022-05-11 DIAGNOSIS — K219 Gastro-esophageal reflux disease without esophagitis: Secondary | ICD-10-CM

## 2022-05-11 DIAGNOSIS — K2100 Gastro-esophageal reflux disease with esophagitis, without bleeding: Secondary | ICD-10-CM

## 2022-05-20 ENCOUNTER — Other Ambulatory Visit: Payer: Self-pay | Admitting: Family

## 2022-05-20 DIAGNOSIS — N3281 Overactive bladder: Secondary | ICD-10-CM

## 2022-05-26 ENCOUNTER — Telehealth: Payer: Self-pay | Admitting: *Deleted

## 2022-05-26 NOTE — Telephone Encounter (Signed)
Daughter stated that if they don't take her to Urgent Care this weekend she will call Monday Morning and schedule an appointment.

## 2022-05-26 NOTE — Telephone Encounter (Signed)
[place], daughter called and stated that she thinks patient has another UTI. Going through a lot of Depends and Very Agitated and calling all hours of the night and threatening to kill husband and stated that he had killed her son. Daughter stated that patient is talking out of her head.   I offered appointment but daughter stated that they could not bring her in today because she cannot drive and husband is not home.   Stated that it would be too much taking her to the Urgent care due to the Dementia and Infection. Requesting Antibiotic to be called in.   Please Advise. (Pended and sent to Shanda Bumps due to [place] out of office.

## 2022-05-26 NOTE — Telephone Encounter (Signed)
She needs evaluation prior to any treatment being called in. She can go to urgent care over the weekend if symptoms worsen or make appt for next week for evaluation

## 2022-06-07 ENCOUNTER — Other Ambulatory Visit: Payer: Self-pay | Admitting: Family

## 2022-06-08 NOTE — Telephone Encounter (Signed)
Alprazolam refilled.  

## 2022-06-15 ENCOUNTER — Other Ambulatory Visit: Payer: Self-pay

## 2022-06-15 ENCOUNTER — Encounter (HOSPITAL_COMMUNITY): Payer: Self-pay

## 2022-06-15 ENCOUNTER — Emergency Department (HOSPITAL_COMMUNITY): Payer: Medicare Other

## 2022-06-15 ENCOUNTER — Observation Stay (HOSPITAL_COMMUNITY): Payer: Medicare Other

## 2022-06-15 ENCOUNTER — Inpatient Hospital Stay (HOSPITAL_COMMUNITY)
Admission: EM | Admit: 2022-06-15 | Discharge: 2022-06-19 | DRG: 436 | Disposition: A | Discharge status: Skilled Nursing Facility | Payer: Medicare Other | Attending: Internal Medicine | Admitting: Internal Medicine

## 2022-06-15 DIAGNOSIS — K219 Gastro-esophageal reflux disease without esophagitis: Secondary | ICD-10-CM | POA: Diagnosis present

## 2022-06-15 DIAGNOSIS — M3500 Sicca syndrome, unspecified: Secondary | ICD-10-CM | POA: Diagnosis not present

## 2022-06-15 DIAGNOSIS — I959 Hypotension, unspecified: Secondary | ICD-10-CM | POA: Diagnosis not present

## 2022-06-15 DIAGNOSIS — R2681 Unsteadiness on feet: Secondary | ICD-10-CM | POA: Diagnosis not present

## 2022-06-15 DIAGNOSIS — Z881 Allergy status to other antibiotic agents status: Secondary | ICD-10-CM | POA: Diagnosis not present

## 2022-06-15 DIAGNOSIS — K581 Irritable bowel syndrome with constipation: Secondary | ICD-10-CM | POA: Diagnosis not present

## 2022-06-15 DIAGNOSIS — E1140 Type 2 diabetes mellitus with diabetic neuropathy, unspecified: Secondary | ICD-10-CM | POA: Diagnosis not present

## 2022-06-15 DIAGNOSIS — M545 Low back pain, unspecified: Secondary | ICD-10-CM

## 2022-06-15 DIAGNOSIS — Z882 Allergy status to sulfonamides status: Secondary | ICD-10-CM

## 2022-06-15 DIAGNOSIS — M6281 Muscle weakness (generalized): Secondary | ICD-10-CM | POA: Diagnosis not present

## 2022-06-15 DIAGNOSIS — R1084 Generalized abdominal pain: Secondary | ICD-10-CM | POA: Diagnosis not present

## 2022-06-15 DIAGNOSIS — E7800 Pure hypercholesterolemia, unspecified: Secondary | ICD-10-CM | POA: Diagnosis not present

## 2022-06-15 DIAGNOSIS — M797 Fibromyalgia: Secondary | ICD-10-CM | POA: Diagnosis not present

## 2022-06-15 DIAGNOSIS — Z86010 Personal history of colonic polyps: Secondary | ICD-10-CM | POA: Diagnosis not present

## 2022-06-15 DIAGNOSIS — Z9181 History of falling: Secondary | ICD-10-CM | POA: Diagnosis not present

## 2022-06-15 DIAGNOSIS — R001 Bradycardia, unspecified: Secondary | ICD-10-CM | POA: Diagnosis not present

## 2022-06-15 DIAGNOSIS — Z880 Allergy status to penicillin: Secondary | ICD-10-CM | POA: Diagnosis not present

## 2022-06-15 DIAGNOSIS — I5032 Chronic diastolic (congestive) heart failure: Secondary | ICD-10-CM | POA: Diagnosis not present

## 2022-06-15 DIAGNOSIS — R2689 Other abnormalities of gait and mobility: Secondary | ICD-10-CM | POA: Diagnosis not present

## 2022-06-15 DIAGNOSIS — Z515 Encounter for palliative care: Secondary | ICD-10-CM | POA: Diagnosis not present

## 2022-06-15 DIAGNOSIS — Z66 Do not resuscitate: Secondary | ICD-10-CM | POA: Diagnosis not present

## 2022-06-15 DIAGNOSIS — K7460 Unspecified cirrhosis of liver: Secondary | ICD-10-CM | POA: Diagnosis not present

## 2022-06-15 DIAGNOSIS — F32A Depression, unspecified: Secondary | ICD-10-CM | POA: Diagnosis not present

## 2022-06-15 DIAGNOSIS — F03911 Unspecified dementia, unspecified severity, with agitation: Secondary | ICD-10-CM | POA: Diagnosis present

## 2022-06-15 DIAGNOSIS — Z79899 Other long term (current) drug therapy: Secondary | ICD-10-CM | POA: Diagnosis not present

## 2022-06-15 DIAGNOSIS — E1165 Type 2 diabetes mellitus with hyperglycemia: Secondary | ICD-10-CM | POA: Diagnosis present

## 2022-06-15 DIAGNOSIS — Z7984 Long term (current) use of oral hypoglycemic drugs: Secondary | ICD-10-CM

## 2022-06-15 DIAGNOSIS — N281 Cyst of kidney, acquired: Secondary | ICD-10-CM | POA: Diagnosis not present

## 2022-06-15 DIAGNOSIS — Z9049 Acquired absence of other specified parts of digestive tract: Secondary | ICD-10-CM | POA: Diagnosis not present

## 2022-06-15 DIAGNOSIS — Z87440 Personal history of urinary (tract) infections: Secondary | ICD-10-CM

## 2022-06-15 DIAGNOSIS — R410 Disorientation, unspecified: Secondary | ICD-10-CM | POA: Diagnosis not present

## 2022-06-15 DIAGNOSIS — I4891 Unspecified atrial fibrillation: Secondary | ICD-10-CM | POA: Diagnosis not present

## 2022-06-15 DIAGNOSIS — Z781 Physical restraint status: Secondary | ICD-10-CM

## 2022-06-15 DIAGNOSIS — Z886 Allergy status to analgesic agent status: Secondary | ICD-10-CM | POA: Diagnosis not present

## 2022-06-15 DIAGNOSIS — K7469 Other cirrhosis of liver: Secondary | ICD-10-CM | POA: Diagnosis not present

## 2022-06-15 DIAGNOSIS — Z800 Family history of malignant neoplasm of digestive organs: Secondary | ICD-10-CM

## 2022-06-15 DIAGNOSIS — Z87891 Personal history of nicotine dependence: Secondary | ICD-10-CM

## 2022-06-15 DIAGNOSIS — Z7982 Long term (current) use of aspirin: Secondary | ICD-10-CM

## 2022-06-15 DIAGNOSIS — F0394 Unspecified dementia, unspecified severity, with anxiety: Secondary | ICD-10-CM | POA: Diagnosis not present

## 2022-06-15 DIAGNOSIS — I480 Paroxysmal atrial fibrillation: Secondary | ICD-10-CM | POA: Diagnosis present

## 2022-06-15 DIAGNOSIS — Z7189 Other specified counseling: Secondary | ICD-10-CM | POA: Diagnosis not present

## 2022-06-15 DIAGNOSIS — G609 Hereditary and idiopathic neuropathy, unspecified: Secondary | ICD-10-CM | POA: Diagnosis not present

## 2022-06-15 DIAGNOSIS — Z885 Allergy status to narcotic agent status: Secondary | ICD-10-CM | POA: Diagnosis not present

## 2022-06-15 DIAGNOSIS — C220 Liver cell carcinoma: Principal | ICD-10-CM | POA: Diagnosis present

## 2022-06-15 DIAGNOSIS — Z808 Family history of malignant neoplasm of other organs or systems: Secondary | ICD-10-CM

## 2022-06-15 DIAGNOSIS — K649 Unspecified hemorrhoids: Secondary | ICD-10-CM | POA: Diagnosis not present

## 2022-06-15 DIAGNOSIS — Z7401 Bed confinement status: Secondary | ICD-10-CM | POA: Diagnosis not present

## 2022-06-15 DIAGNOSIS — G6289 Other specified polyneuropathies: Secondary | ICD-10-CM

## 2022-06-15 DIAGNOSIS — Z8249 Family history of ischemic heart disease and other diseases of the circulatory system: Secondary | ICD-10-CM

## 2022-06-15 DIAGNOSIS — S8265XD Nondisplaced fracture of lateral malleolus of left fibula, subsequent encounter for closed fracture with routine healing: Secondary | ICD-10-CM | POA: Diagnosis not present

## 2022-06-15 DIAGNOSIS — E0865 Diabetes mellitus due to underlying condition with hyperglycemia: Secondary | ICD-10-CM | POA: Diagnosis not present

## 2022-06-15 DIAGNOSIS — R160 Hepatomegaly, not elsewhere classified: Secondary | ICD-10-CM | POA: Diagnosis not present

## 2022-06-15 DIAGNOSIS — F0390 Unspecified dementia without behavioral disturbance: Secondary | ICD-10-CM | POA: Diagnosis not present

## 2022-06-15 DIAGNOSIS — F0393 Unspecified dementia, unspecified severity, with mood disturbance: Secondary | ICD-10-CM | POA: Diagnosis not present

## 2022-06-15 DIAGNOSIS — D490 Neoplasm of unspecified behavior of digestive system: Secondary | ICD-10-CM | POA: Diagnosis not present

## 2022-06-15 DIAGNOSIS — Z825 Family history of asthma and other chronic lower respiratory diseases: Secondary | ICD-10-CM

## 2022-06-15 DIAGNOSIS — R1031 Right lower quadrant pain: Secondary | ICD-10-CM | POA: Diagnosis not present

## 2022-06-15 DIAGNOSIS — R41841 Cognitive communication deficit: Secondary | ICD-10-CM | POA: Diagnosis not present

## 2022-06-15 DIAGNOSIS — R0689 Other abnormalities of breathing: Secondary | ICD-10-CM | POA: Diagnosis not present

## 2022-06-15 DIAGNOSIS — R109 Unspecified abdominal pain: Secondary | ICD-10-CM | POA: Diagnosis not present

## 2022-06-15 DIAGNOSIS — Z888 Allergy status to other drugs, medicaments and biological substances status: Secondary | ICD-10-CM

## 2022-06-15 DIAGNOSIS — Z90710 Acquired absence of both cervix and uterus: Secondary | ICD-10-CM | POA: Diagnosis not present

## 2022-06-15 DIAGNOSIS — N3091 Cystitis, unspecified with hematuria: Secondary | ICD-10-CM

## 2022-06-15 LAB — COMPREHENSIVE METABOLIC PANEL
ALT: 52 U/L — ABNORMAL HIGH (ref 0–44)
AST: 41 U/L (ref 15–41)
Albumin: 3.9 g/dL (ref 3.5–5.0)
Alkaline Phosphatase: 126 U/L (ref 38–126)
Anion gap: 8 (ref 5–15)
BUN: 19 mg/dL (ref 8–23)
CO2: 25 mmol/L (ref 22–32)
Calcium: 9.8 mg/dL (ref 8.9–10.3)
Chloride: 100 mmol/L (ref 98–111)
Creatinine, Ser: 0.79 mg/dL (ref 0.44–1.00)
GFR, Estimated: >60 mL/min (ref >60)
Glucose, Bld: 120 mg/dL — ABNORMAL HIGH (ref 70–99)
Potassium: 3.9 mmol/L (ref 3.5–5.1)
Sodium: 133 mmol/L — ABNORMAL LOW (ref 135–145)
Total Bilirubin: 0.7 mg/dL (ref 0.3–1.2)
Total Protein: 7.4 g/dL (ref 6.5–8.1)

## 2022-06-15 LAB — URINALYSIS, ROUTINE W REFLEX MICROSCOPIC
Bacteria, UA: NONE SEEN
Bilirubin Urine: NEGATIVE
Glucose, UA: NEGATIVE mg/dL
Hgb urine dipstick: NEGATIVE
Ketones, ur: NEGATIVE mg/dL
Nitrite: NEGATIVE
Protein, ur: NEGATIVE mg/dL
Specific Gravity, Urine: 1.016 (ref 1.005–1.030)
pH: 7 (ref 5.0–8.0)

## 2022-06-15 LAB — CBC
HCT: 37.1 % (ref 36.0–46.0)
Hemoglobin: 12 g/dL (ref 12.0–15.0)
MCH: 30.8 pg (ref 26.0–34.0)
MCHC: 32.3 g/dL (ref 30.0–36.0)
MCV: 95.4 fL (ref 80.0–100.0)
Platelets: 216 10*3/uL (ref 150–400)
RBC: 3.89 MIL/uL (ref 3.87–5.11)
RDW: 13.1 % (ref 11.5–15.5)
WBC: 5.5 10*3/uL (ref 4.0–10.5)
nRBC: 0 % (ref 0.0–0.2)

## 2022-06-15 LAB — HEPATITIS PANEL, ACUTE
HCV Ab: NONREACTIVE
Hep A IgM: NONREACTIVE
Hep B C IgM: NONREACTIVE
Hepatitis B Surface Ag: NONREACTIVE

## 2022-06-15 LAB — LIPASE, BLOOD: Lipase: 47 U/L (ref 11–51)

## 2022-06-15 MED ORDER — SENNOSIDES-DOCUSATE SODIUM 8.6-50 MG PO TABS
1.0000 | ORAL_TABLET | Freq: Two times a day (BID) | ORAL | Status: DC
  Administered 2022-06-16 – 2022-06-19 (×8): 1 via ORAL
  Filled 2022-06-15 (×8): qty 1

## 2022-06-15 MED ORDER — SODIUM CHLORIDE 0.9 % IV SOLN: INTRAVENOUS | Status: DC

## 2022-06-15 MED ORDER — MELATONIN 5 MG PO TABS: 5.0000 mg | ORAL_TABLET | Freq: Every evening | ORAL | Status: DC | PRN

## 2022-06-15 MED ORDER — LINACLOTIDE 145 MCG PO CAPS
145.0000 ug | ORAL_CAPSULE | Freq: Every day | ORAL | Status: DC
  Administered 2022-06-16 – 2022-06-19 (×4): 145 ug via ORAL
  Filled 2022-06-15 (×4): qty 1

## 2022-06-15 MED ORDER — GADOBUTROL 1 MMOL/ML IV SOLN
6.0000 mL | Freq: Once | INTRAVENOUS | Status: AC | PRN
  Administered 2022-06-15: 6 mL via INTRAVENOUS

## 2022-06-15 MED ORDER — POLYETHYLENE GLYCOL 3350 17 G PO PACK
17.0000 g | PACK | Freq: Every day | ORAL | Status: DC
  Administered 2022-06-16 – 2022-06-19 (×5): 17 g via ORAL
  Filled 2022-06-15 (×5): qty 1

## 2022-06-15 MED ORDER — OXYCODONE HCL 5 MG PO TABS
5.0000 mg | ORAL_TABLET | Freq: Four times a day (QID) | ORAL | Status: DC | PRN
  Administered 2022-06-16 – 2022-06-17 (×2): 5 mg via ORAL
  Filled 2022-06-15 (×2): qty 1

## 2022-06-15 MED ORDER — PROCHLORPERAZINE EDISYLATE 10 MG/2ML IJ SOLN: 5.0000 mg | Freq: Four times a day (QID) | INTRAMUSCULAR | Status: DC | PRN

## 2022-06-15 MED ORDER — FENTANYL CITRATE PF 50 MCG/ML IJ SOSY
25.0000 ug | PREFILLED_SYRINGE | Freq: Once | INTRAMUSCULAR | Status: AC
  Administered 2022-06-15: 25 ug via INTRAVENOUS
  Filled 2022-06-15: qty 1

## 2022-06-15 MED ORDER — HYDROMORPHONE HCL 1 MG/ML IJ SOLN: 0.5000 mg | INTRAMUSCULAR | Status: DC | PRN

## 2022-06-15 MED ORDER — DULOXETINE HCL 30 MG PO CPEP: 30.0000 mg | ORAL_CAPSULE | Freq: Every day | ORAL | Status: DC

## 2022-06-15 MED ORDER — PILOCARPINE HCL 5 MG PO TABS
5.0000 mg | ORAL_TABLET | Freq: Two times a day (BID) | ORAL | Status: DC
  Administered 2022-06-16 – 2022-06-19 (×8): 5 mg via ORAL
  Filled 2022-06-15 (×9): qty 1

## 2022-06-15 MED ORDER — ENOXAPARIN SODIUM 40 MG/0.4ML IJ SOSY
40.0000 mg | PREFILLED_SYRINGE | INTRAMUSCULAR | Status: DC
  Administered 2022-06-16 – 2022-06-18 (×4): 40 mg via SUBCUTANEOUS
  Filled 2022-06-15 (×4): qty 0.4

## 2022-06-15 MED ORDER — IOHEXOL 350 MG/ML SOLN
65.0000 mL | Freq: Once | INTRAVENOUS | Status: AC | PRN
  Administered 2022-06-15: 65 mL via INTRAVENOUS

## 2022-06-15 MED ORDER — ACETAMINOPHEN 325 MG PO TABS
650.0000 mg | ORAL_TABLET | Freq: Four times a day (QID) | ORAL | Status: DC | PRN
  Administered 2022-06-16 (×2): 650 mg via ORAL
  Filled 2022-06-15 (×2): qty 2

## 2022-06-15 MED ORDER — MEMANTINE HCL 10 MG PO TABS
10.0000 mg | ORAL_TABLET | Freq: Two times a day (BID) | ORAL | Status: DC
  Administered 2022-06-16 – 2022-06-19 (×8): 10 mg via ORAL
  Filled 2022-06-15 (×9): qty 1

## 2022-06-15 NOTE — ED Triage Notes (Signed)
Complains of right lower quad pain x 2 weeks.  Caregiver reports constipation but at times it comes out as "chunky liquid"

## 2022-06-15 NOTE — H&P (Addendum)
History and Physical  Ariana [name] ZOX:[id number] DOB: [birth year] DOA: 06/15/2022  Referring physician: Dr. Warden Fillers, EDP  PCP: Ariana Bookman, NP  Outpatient Specialists: Cardiology, urology, GI. Patient coming from: Home  Chief Complaint: RLQ abdominal pain x 2 weeks   HPI: Ariana [name] is a 89 y.o. female with medical history significant for dementia, Sjgren's syndrome, paroxysmal A-fib not on oral anticoagulation, chronic anxiety/depression, GERD, prediabetes, chronic constipation on home Linzess, previously diagnosed liver mass in 2021, who presented to [facility] ED with complaints of right lower quadrant abdominal pain for the past 2 weeks.  Associated with constipation.  No reported subjective fevers.  The patient lives at home alone and has a care taker on weekdays.  Her daughter Ariana [name] is her next of kin.  Upon ER presentation, the patient received IV opioid-based analgesics for her abdominal pain, but she remained symptomatic.  CT abdomen and pelvis with contrast revealed the following findings:  1. Enlargement of rounded shape mass within the inferior tip of the right lobe of the liver causing increased capsular distension and now measuring roughly 9.9 x 9.4 x 9.2 cm. The mass demonstrates heterogeneous enhancement with areas of focal internal necrosis. No evidence by CT of spontaneous rupture or bleed of the mass. The increased capsular distension of the liver is likely the cause of right lower abdominal pain. Based on appearance, this mass most likely represents a hepatocellular carcinoma. 2. Stable diverticulosis of the sigmoid colon without evidence of diverticulitis. 3. Stable small left inguinal hernia containing fat. 4. Osteopenia and degenerative disc disease at L4-5 and L5-S1.  EDP discussed the case with medical oncology and general surgery, they were consulted to assist with the management.  MRI of the liver and AFP are pending.  EDP requested admission  for symptom management.  The patient was admitted by [facility], hospitalist service, to telemetry surgical unit as inpatient status.  ED Course: Tmax 99.  BP 122/71, pulse 62, respiratory 23, saturation 99% on room air.  Lab studies remarkable for serum sodium 133, glucose 120.  ALT 52.  CBC essentially unremarkable.  Review of Systems: Review of systems as noted in the HPI. All other systems reviewed and are negative.   Past Medical History:  Diagnosis Date   Abnormality of gait    Adenomatous polyp of colon 2002   7mm   Allergic rhinitis    Anxiety    Anxiety and depression    Chronic back pain    Dementia without behavioral disturbance (HCC)    Depression    Diabetes (HCC)    Diverticulosis of colon    Dry eye syndrome    Dysphagia    Dysthymic disorder    Fall    Fibromyalgia    GERD (gastroesophageal reflux disease)    H/O hiatal hernia    History of adverse drug reaction    History of cerebrovascular disease 09/24/2014   History of recurrent UTIs    Hypertension, benign    Irritable bowel syndrome    Low back pain syndrome    Memory loss    Mitral valve prolapse    Paroxysmal A-fib (HCC)    Peripheral neuropathy    "both feet and legs"   Physical deconditioning    Sjogren's syndrome (HCC)    Therapeutic opioid-induced constipation (OIC)    Thyroid nodule    Urinary incontinence    Past Surgical History:  Procedure Laterality Date   ABDOMINAL HYSTERECTOMY  1967   APPENDECTOMY  CARDIAC CATHETERIZATION  02/17/2003   normal L main, LAD free of disease, Cfx free of disease, RCA free of disease (Dr. Mervyn Skeeters. [place])   CATARACT EXTRACTION, BILATERAL     CHOLECYSTECTOMY  2000   COLONOSCOPY W/ BIOPSIES     multiple   DENTAL SURGERY     multiple tooth extractions   ESOPHAGOGASTRODUODENOSCOPY (EGD) WITH ESOPHAGEAL DILATION N/A 08/23/2012   Procedure: ESOPHAGOGASTRODUODENOSCOPY (EGD) WITH ESOPHAGEAL DILATION;  Surgeon: Rachael Fee, MD;  Location: [facility];  Service:  Endoscopy;  Laterality: N/A;   NASAL SEPTUM SURGERY  1980   NM MYOCAR PERF WALL MOTION  2003   persantine - normal static and dynamic study w/apical thinning and presvered LV function, no ischemia   SINUS EXPLORATION     ossifiying fibroma   TEMPOROMANDIBULAR JOINT SURGERY  1986   Dr. Warren Danes   TRANSTHORACIC ECHOCARDIOGRAM  2001   mild LVH, normal LV    Social History:  reports that she has quit smoking. She has never used smokeless tobacco. She reports that she does not drink alcohol and does not use drugs.   Allergies  Allergen Reactions   Banana Nausea And Vomiting   Codeine Nausea Only    unless given with Phenergan   Klonopin [Clonazepam] Other (See Comments)    Causes hallucination    Meperidine Hcl Nausea Only    unless given with Phenergan   Norflex [Orphenadrine Citrate] Nausea Only    Unless given with Phenergan   Oxycodone-Acetaminophen Nausea Only    unless given with phenergan   Propoxyphene Hcl Nausea Only    unless given with phenergan   Zoloft [Sertraline Hcl] Other (See Comments)    Caused lethargy   Doxycycline Other (See Comments)    Unknown reaction   Naproxen Other (See Comments)    Unknown reaction   Penicillins Other (See Comments)    Unknown reaction Has patient had a PCN reaction causing immediate rash, facial/tongue/throat swelling, SOB or lightheadedness with hypotension: Unknown Has patient had a PCN reaction causing severe rash involving mucus membranes or skin necrosis: Unknown Has patient had a PCN reaction that required hospitalization: pt was in the hospital at time of reaction Has patient had a PCN reaction occurring within the last 10 years: Unknown If all of the above answers are "NO", then may proceed with Cephalos   Phenothiazines Other (See Comments)    Unknown reaction   Stelazine Other (See Comments)    Unknown reaction   Sulfamethoxazole-Trimethoprim Other (See Comments)    Unknown reaction   Tolectin [Tolmetin Sodium]  Other (See Comments)    Unknown reaction   Tramadol Other (See Comments)    Unknown reaction    Family History  Problem Relation Age of Onset   Heart disease Father        heart attack   Pneumonia Father    Heart attack Mother    Hypertension Mother    Colon cancer Sister    Kidney disease Daughter    Asthma Daughter    Arthritis Daughter 28       osteo,   Heart disease Son 59       stage 3 CHF(Diastolic /Systolic)   Throat cancer Brother    Hypertension Maternal Grandmother       Prior to Admission medications   Medication Sig Start Date End Date Taking? Authorizing Provider  acetaminophen (TYLENOL) 500 MG tablet Take 1,000 mg by mouth 2 (two) times daily.    [provider]  ALPRAZolam Prudy Feeler)  0.25 MG tablet Take 1 tablet by mouth twice daily as needed for anxiety 06/08/22   Ngetich, Dinah C, NP  antiseptic oral rinse (BIOTENE) LIQD 15 mLs by Mouth Rinse route as needed.    [provider]  Capsaicin-Menthol-Methyl Sal (CAPSAICIN-METHYL SAL-MENTHOL) 0.025-1-12 % CREA Apply 1 application topically 2 (two) times daily. 09/11/18   Ngetich, Dinah C, NP  carboxymethylcellulose (REFRESH PLUS) 0.5 % SOLN Place 1 drop into both eyes in the morning, at noon, in the evening, and at bedtime.    [provider]  CRANBERRY PO Take 2 tablets by mouth daily. AZO    [provider]  diltiazem (CARDIZEM CD) 180 MG 24 hr capsule Take 1 capsule by mouth once daily 01/13/22   Hilty, Lisette Abu, MD  DULoxetine (CYMBALTA) 30 MG capsule TAKE 1 CAPSULE BY MOUTH ONCE DAILYTAKE ALONG WITH 60MG  TO =90MG  FOR BACK PAIN AND DEPRESSION 04/10/22   Ngetich, Dinah C, NP  DULoxetine (CYMBALTA) 60 MG capsule TAKE 1 CAPSULE BY MOUTH ONCE DAILY (TAKE WITH 30 MG CAPSULE) FOR BACK PAIN AND DEPRESSION 05/10/22   Ngetich, Dinah C, NP  furosemide (LASIX) 40 MG tablet Take 1 tablet by mouth once daily 03/17/22   Ngetich, Dinah C, NP  glucose blood (ONETOUCH VERIO) test strip USE 1 STRIP TO  CHECK GLUCOSE ONCE DAILY 06/27/21   Ngetich, Dinah C, NP  hydrocortisone cream 1 % Apply to affected area 2 times daily 12/09/21   Elayne Snare K, DO  Lancets Micro Thin 33G MISC Use to test blood sugar daily. Dx: E08.65 01/19/21   Ngetich, Dinah C, NP  lansoprazole (PREVACID) 30 MG capsule TAKE 1 CAPSULE BY MOUTH ONCE DAILY AT NOON 05/15/22   Ngetich, Dinah C, NP  LINZESS 290 MCG CAPS capsule TAKE 1 CAPSULE BY MOUTH EVERY DAY AT BEDTIME 05/05/22   Sharon Seller, NP  loratadine (CLARITIN) 10 MG tablet Take 1 tablet (10 mg total) by mouth daily. 05/12/20   Octavia Heir, NP  memantine (NAMENDA) 10 MG tablet Take 1 tablet by mouth twice daily 04/14/22   Ngetich, Dinah C, NP  MYRBETRIQ 50 MG TB24 tablet Take 1 tablet by mouth once daily 05/22/22   Ngetich, Dinah C, NP  nystatin (MYCOSTATIN) 100000 UNIT/ML suspension Use as directed 5 mLs (500,000 Units total) in the mouth or throat 4 (four) times daily. 10/31/21   Ngetich, Dinah C, NP  nystatin cream (MYCOSTATIN) Apply 1 application. topically 2 (two) times daily. Peri-area 06/17/21   Ngetich, Dinah C, NP  pilocarpine (SALAGEN) 5 MG tablet Take 1 tablet by mouth twice daily 04/20/22   Ngetich, Dinah C, NP  polyethylene glycol (MIRALAX / GLYCOLAX) packet Take 17 g by mouth daily as needed for mild constipation (constipation). Mix with 8 oz. water or juice    [provider]  potassium chloride SA (KLOR-CON M) 20 MEQ tablet TAKE 1  BY MOUTH ONCE DAILY 04/20/22   Ngetich, Dinah C, NP  pregabalin (LYRICA) 100 MG capsule Take 1 capsule by mouth twice daily 02/23/22   Ngetich, Dinah C, NP  saccharomyces boulardii (FLORASTOR) 250 MG capsule Take 1 capsule (250 mg total) by mouth 2 (two) times daily. 06/20/21   Ngetich, Dinah C, NP  senna (SENOKOT) 8.6 MG tablet Take 1 tablet (8.6 mg total) by mouth daily. 07/19/20   Fargo, Amy E, NP  senna-docusate (SENNA S) 8.6-50 MG tablet Take 1 tablet by mouth daily. 05/12/20   Fargo, Amy E, NP  sitaGLIPtin (JANUVIA) 100 MG  tablet Take 1 tablet by mouth once daily 03/17/22   Ngetich, Dinah C, NP    Physical Exam: BP 122/71   Pulse 62   Temp (!) 97.4 F (36.3 C) (Oral)   Resp (!) 23   Ht 5\' 3"  (1.6 m)   Wt 62.6 kg   SpO2 99%   BMI 24.45 kg/m   General: 89 y.o. year-old female well developed well nourished in no acute distress.  Alert and interactive. Cardiovascular: Regular rate and rhythm with no rubs or gallops.  No thyromegaly or JVD noted.  No lower extremity edema. 2/4 pulses in all 4 extremities. Respiratory: Clear to auscultation with no wheezes or rales. Good inspiratory effort. Abdomen: Soft mildly distended and tender right lower quadrant abdomen, with normal bowel sounds x4 quadrants. Muskuloskeletal: No cyanosis, clubbing or edema noted bilaterally Neuro: CN II-XII intact, strength, sensation, reflexes Skin: No ulcerative lesions noted or rashes Psychiatry: Judgement and insight appear altered with baseline dementia. Mood is appropriate for condition and setting          Labs on Admission:  Basic Metabolic Panel: Recent Labs  Lab 06/15/22 1314  NA 133*  K 3.9  CL 100  CO2 25  GLUCOSE 120*  BUN 19  CREATININE 0.79  CALCIUM 9.8   Liver Function Tests: Recent Labs  Lab 06/15/22 1314  AST 41  ALT 52*  ALKPHOS 126  BILITOT 0.7  PROT 7.4  ALBUMIN 3.9   Recent Labs  Lab 06/15/22 1314  LIPASE 47   No results for input(s): "AMMONIA" in the last 168 hours. CBC: Recent Labs  Lab 06/15/22 1314  WBC 5.5  HGB 12.0  HCT 37.1  MCV 95.4  PLT 216   Cardiac Enzymes: No results for input(s): "CKTOTAL", "CKMB", "CKMBINDEX", "TROPONINI" in the last 168 hours.  BNP (last 3 results) No results for input(s): "BNP" in the last 8760 hours.  ProBNP (last 3 results) No results for input(s): "PROBNP" in the last 8760 hours.  CBG: No results for input(s): "GLUCAP" in the last 168 hours.  Radiological Exams on Admission: CT ABDOMEN PELVIS W CONTRAST  Result Date:  06/15/2022 CLINICAL DATA:  Right lower quadrant abdominal pain. Known right inferior hepatic mass. EXAM: CT ABDOMEN AND PELVIS WITH CONTRAST TECHNIQUE: Multidetector CT imaging of the abdomen and pelvis was performed using the standard protocol following bolus administration of intravenous contrast. RADIATION DOSE REDUCTION: This exam was performed according to the departmental dose-optimization program which includes automated exposure control, adjustment of the mA and/or kV according to patient size and/or use of iterative reconstruction technique. CONTRAST:  65mL OMNIPAQUE IOHEXOL 350 MG/ML SOLN COMPARISON:  02/14/2021 FINDINGS: Lower chest: No acute abnormality. Hepatobiliary: Enlargement rounded shape mass within the inferior tip of the right lobe of the liver causing increased capsular distension and now measuring roughly 9.9 x 9.4 x 9.2 cm. The mass demonstrates heterogeneous enhancement with areas of focal internal necrosis. Due to the mass, the inferior extent of the right lobe of the liver extends into the upper right pelvis. No evidence by CT of spontaneous rupture or bleed of the mass. The rest of the liver is unremarkable without evidence of biliary dilatation. The gallbladder has been removed. Pancreas: Stable atrophic pancreas. Spleen: Normal in size without focal abnormality. Adrenals/Urinary Tract: Adrenal glands are unremarkable. No hydronephrosis. No calculi or solid renal masses. Bladder is unremarkable. Stomach/Bowel: Bowel shows no evidence of obstruction, ileus, inflammation or lesion. The appendix is absent. No free intraperitoneal air. Stable diverticulosis of the  sigmoid colon without evidence of diverticulitis. Vascular/Lymphatic: No significant vascular findings are present. No enlarged abdominal or pelvic lymph nodes. Reproductive: Status post hysterectomy. No adnexal masses. Other: Stable small left inguinal hernia containing fat. No abscess or ascites. Musculoskeletal: Stable  degenerative disc disease at L4-5 and L5-S1. Osteopenia without lumbar or lower thoracic compression fractures. IMPRESSION: 1. Enlargement of rounded shape mass within the inferior tip of the right lobe of the liver causing increased capsular distension and now measuring roughly 9.9 x 9.4 x 9.2 cm. The mass demonstrates heterogeneous enhancement with areas of focal internal necrosis. No evidence by CT of spontaneous rupture or bleed of the mass. The increased capsular distension of the liver is likely the cause of right lower abdominal pain. Based on appearance, this mass most likely represents a hepatocellular carcinoma. 2. Stable diverticulosis of the sigmoid colon without evidence of diverticulitis. 3. Stable small left inguinal hernia containing fat. 4. Osteopenia and degenerative disc disease at L4-5 and L5-S1. Electronically Signed   By: Irish Lack M.D.   On: 06/15/2022 17:22    EKG: I independently viewed the EKG done and my findings are as followed: None available at the time of this visit.  Assessment/Plan Present on Admission:  Liver mass  Principal Problem:   Liver mass  Large liver mass seen on CT scan with associated right lower quadrant pain and constipation CT scan compared to images done on 02/14/2021. General surgery and medical oncology consulted by EDP to assist with the management Symptom control As needed analgesics As needed antiemetics Bowel regimen in place  Concern for hepatocellular carcinoma Follow MRI liver, hepatitis panel, and AFP Patient's daughter requested updates.  Chronic constipation in the setting of large liver mass Irritable bowel syndrome-constipation Resume home Linzess Bowel regimen in place.  Paroxysmal atrial fibrillation, not on oral anticoagulation Rate is currently controlled. Closely monitor on telemetry.  Sjogren's disease Resume home regimen Aspiration precautions  Chronic anxiety/depression Dementia Resume home regimen Fall  precautions, aspiration precautions, delirium precautions.  GERD Resume home PPI.  HFpEF 60-65% from 2D echo done in 2019 Euvolemic on exam Closely monitor volume status while on IV fluid Hold off home p.o. Lasix Gentle IV fluid hydration after IV contrast, NS at 50 cc/h x 2 days  History of recurrent UTI UA is pending.  If positive treat as indicated.  Prediabetes Last hemoglobin A1c 6.2 on 02/03/2022. Heart healthy, carb modified diet Insulin sliding scale for hyperglycemia.     DVT prophylaxis: Subcu Lovenox daily  Code Status: DNR stated by the patient's daughter who is next of kin.  Family Communication: Updated patient daughter via phone.  Disposition Plan: Admitted to telemetry surgical unit at [facility].  Consults called: General surgery, medical oncology.  Admission status: Inpatient status.   Status is: Inpatient The patient requires at least 2 midnights for further evaluation and treatment of present condition.   Darlin Drop MD [facility] Pager [phone]  If 7PM-7AM, please contact night-coverage [email or web address] Password [email or web address]  06/15/2022, 8:11 PM

## 2022-06-15 NOTE — ED Provider Notes (Signed)
[place] EMERGENCY DEPARTMENT AT [facility] Provider Note  Medical Decision Making   HPI: Ariana [name] is a 89 y.o. female with history perinent for HDL, HTN, A-fib not on dementia, vascular dementia per daughter, prolonged QT syndrome, prior CVA, Sjogren's disease, T2DM who presents complaining of right lower quadrant pain. Patient arrived via POV reportedly accompanied by caregiver.  History provided by patient and daughter over the phone.  No interpreter required for this encounter.  Overall poor historian, complains of bloating and pain to right lower quadrant, unable to quantify how long symptoms have been going on, at times patient says weeks or months.  Patient reports that she has a "liver mass" and she is concerned that her pain could be related to that.  Per chart review patient initially presented accompanied by a caregiver who reported patient was also having constipation, caregiver not present at the bedside during my evaluation.  I personally called the patient's daughter, Arvie, at phone number listed in chart.  Neko states that the patient was initially diagnosed with a liver mass in 2021, and it was found to be enlarging in 2023, however reports that patient has not had follow-up for this, reports that patient has been seen for numerous conditions of the past several years, however different physicians have declined to perform intervention due to the patient's age and comorbidities, unclear what precise comorbidities are of primary concern.  Reports that patient has dementia, however has declined to move into nursing home, does have a caregiver who is present from 9-5 on weekdays, and that family helps out on weekends.  Reports that patient has had progressive abdominal pain and bloating for several weeks.  ROS: As per HPI. Please see MAR for complete past medical history, surgical history, and social history.   Physical exam is pertinent for distended abdomen with  tenderness to palpation in right lower quadrant.   The differential includes but is not limited to, appendicitis, bowel obstruction, pancreatitis, UTI.  Additional history obtained from: Daughter over the phone External records from outside source obtained and reviewed including: Personally reviewed patient's prior records, initially found to have liver lesion in 2021 that was 4.5 x 5.2 x 5.5 cm.  By CT scan performed in January 2023 size of liver mass had progressed to 9.8 x 7.4 x 8.1 cm   ED provider interpretation of ECG: Not indicated  ED provider interpretation of radiology/imaging: Personally reviewed CT of abdomen pelvis, I do appreciate large mass at the inferior pole of the liver.  I do not appreciate intra-abdominal free air, significant intra-abdominal free fluid, air-fluid levels, or bowel wall enhancement.  Labs ordered were interpreted by myself as well as my attending and were incorporated into the medical decision making process for this patient.  ED provider interpretation of labs: CMP without AKI or emergent electrolyte derangement, CBC without leukocytosis, anemia, thrombocytopenia, lipase WNL.  Interventions: Fentanyl  See the EMR for full details regarding lab and imaging results.  Labs and imaging ordered while patient was in triage, patient does have significant distention and tenderness to palpation in right lower quadrant, patient as well as daughter over the phone expressed concern that this could be related to a liver mass, patient does have a remarkably large liver mass that is in the anatomic area of her right lower quadrant.  Patient requires fentanyl for pain control while in the ED.  Overall, labs and imaging do not reveal other acute intra-abdominal processes.  Given report of change in  bowel movements as well as p.o. intake as well as severe pain secondary to this lesion, do feel that patient warrants admission.  Given patient's frailty, dementia, fact that patient  lives alone at home with limited in person assist, feel that patient warrants admission for further workup, pain control, feel that given patient's constipation, comorbidities, age that discharge with p.o.'s for pain control could worsen patient's overall clinical picture.  Therefore medicine consulted for admission, I personally spoke with Dr. Margo Aye with the hospitalist service.  She requested oncology and surgery consultations, I spoke with Dr. Truett Perna and Dr. Freida Busman in oncology and surgery respectively, Dr. Truett Perna requested AFP and MRI, and stated that given patient requires additional workup no emergent ED consultation is required at this time, patient would be more appropriate for inpatient consultation once further workup is done.  Dr. Freida Busman states that patient will be evaluated in the a.m., however does not feel that patient has need for emergent surgical intervention.  These recommendations were indicated to Dr. Margo Aye, Dr. Margo Aye admitted the patient to her service.   Consults: Hospitalists, oncology, surgery  Disposition: ADMIT: I believe the patient requires admission for further care and management. The patient was admitted to hospitalists. Please see inpatient provider note for additional treatment plan details.   The plan for this patient was discussed with Dr. Jeraldine Loots, who voiced agreement and who oversaw evaluation and treatment of this patient.  Clinical Impression:  1. Liver mass    Admit  Therapies: These medications and interventions were provided for the patient while in the ED. Medications  enoxaparin (LOVENOX) injection 40 mg (has no administration in time range)  acetaminophen (TYLENOL) tablet 650 mg (has no administration in time range)  oxyCODONE (Oxy IR/ROXICODONE) immediate release tablet 5 mg (has no administration in time range)  HYDROmorphone (DILAUDID) injection 0.5 mg (has no administration in time range)  prochlorperazine (COMPAZINE) injection 5 mg (has no  administration in time range)  melatonin tablet 5 mg (has no administration in time range)  polyethylene glycol (MIRALAX / GLYCOLAX) packet 17 g (has no administration in time range)  senna-docusate (Senokot-S) tablet 1 tablet (has no administration in time range)  0.9 %  sodium chloride infusion (has no administration in time range)  linaclotide (LINZESS) capsule 145 mcg (has no administration in time range)  memantine (NAMENDA) tablet 10 mg (has no administration in time range)  pilocarpine (SALAGEN) tablet 5 mg (has no administration in time range)  DULoxetine (CYMBALTA) DR capsule 30 mg (has no administration in time range)  iohexol (OMNIPAQUE) 350 MG/ML injection 65 mL (65 mLs Intravenous Contrast Given 06/15/22 1706)  fentaNYL (SUBLIMAZE) injection 25 mcg (25 mcg Intravenous Given 06/15/22 1901)    MDM generated using voice dictation software and may contain dictation errors.  Please contact me for any clarification or with any questions.  Clinical Complexity A medically appropriate history, review of systems, and physical exam was performed.  Collateral history obtained from: Family, chart review I personally reviewed the labs, EKG, imaging as discussed above. Patient's presentation is most consistent with acute presentation with potential threat to life or bodily function Treatment: Hospitalization Medications: Parenteral controlled substances Discussed patient's care with providers from the following different specialties: Specialists, surgery, oncology  Physical Exam   ED Triage Vitals  Enc Vitals Group     BP 06/15/22 1302 125/65     Pulse Rate 06/15/22 1302 73     Resp 06/15/22 1302 18     Temp 06/15/22 1302 99 F (37.2  C)     Temp src --      SpO2 06/15/22 1302 95 %     Weight 06/15/22 1312 138 lb (62.6 kg)     Height 06/15/22 1312 5\' 3"  (1.6 m)     Head Circumference --      Peak Flow --      Pain Score 06/15/22 1312 10     Pain Loc --      Pain Edu? --      Excl.  in GC? --      Physical Exam Vitals and nursing note reviewed.  Constitutional:      General: She is not in acute distress.    Comments: Frail and elderly appearing  HENT:     Head: Normocephalic and atraumatic.  Eyes:     Conjunctiva/sclera: Conjunctivae normal.  Cardiovascular:     Rate and Rhythm: Normal rate and regular rhythm.     Heart sounds: No murmur heard. Pulmonary:     Effort: Pulmonary effort is normal. No respiratory distress.     Breath sounds: Normal breath sounds.  Abdominal:     General: Abdomen is protuberant.     Palpations: Abdomen is soft. There is mass (RLQ).     Tenderness: There is abdominal tenderness in the right lower quadrant.  Musculoskeletal:        General: No swelling.     Cervical back: Neck supple.  Skin:    General: Skin is warm and dry.     Capillary Refill: Capillary refill takes less than 2 seconds.  Neurological:     Mental Status: She is alert.  Psychiatric:        Mood and Affect: Mood normal.       Procedure Note  Procedures  CT ABDOMEN PELVIS W CONTRAST  Final Result    MR LIVER W WO CONTRAST    (Results Pending)    L. Erlene Quan, MD Emergency Medicine, PGY-2   Curley Spice, MD 06/15/22 2952    Gerhard Munch, MD 06/16/22 2246

## 2022-06-15 NOTE — ED Provider Triage Note (Signed)
Emergency Medicine Provider Triage Evaluation Note  Ariana [name] , a 89 y.o. female  was evaluated in triage.  Pt complains of abdominal pain.  Patient reports to the emergency department with caregiver concerns abdominal pain present for the last several weeks/months.  Caregiver reports that she was seen at [facility] emergency department a few months ago and was told that she has a right liver mass.  She has not received follow-up for this.  She reported that she is having some difficulty with bowel movements as she is typically regular but is now having a few days in between typical bowel movements.  She still reports able to pass gas.  Caregiver does state that there has been some blood with bowel movements the patient does have a history of hemorrhoids.  Review of Systems  Positive: As above Negative: As above  Physical Exam  BP 125/65 (BP Location: Right Arm)   Pulse 73   Temp 99 F (37.2 C)   Resp 18   Ht 5\' 3"  (1.6 m)   Wt 62.6 kg   SpO2 95%   BMI 24.45 kg/m  Gen:   Awake, no distress   Resp:  Normal effort  MSK:   Moves extremities without difficulty  Other:  Some abdominal distention with multiple areas of firmness.  No guarding.  Medical Decision Making  Medically screening exam initiated at 1:37 PM.  Appropriate orders placed.  BONNYE HALLE was informed that the remainder of the evaluation will be completed by another provider, this initial triage assessment does not replace that evaluation, and the importance of remaining in the ED until their evaluation is complete.    Smitty Knudsen, PA-C 06/15/22 1340

## 2022-06-15 NOTE — ED Notes (Signed)
ED TO INPATIENT HANDOFF REPORT  ED Nurse Name and Phone #: [phone] Delante Karapetyan  S Name/Age/Gender Ariana [name] 89 y.o. female Room/Bed: 016C/016C  Code Status   Code Status: DNR  Home/SNF/Other Home Patient oriented to: self, place, and situation Is this baseline? Yes   Triage Complete: Triage complete  Chief Complaint Liver mass [R16.0]  Triage Note Complains of right lower quad pain x 2 weeks.  Caregiver reports constipation but at times it comes out as "chunky liquid"    Allergies Allergies  Allergen Reactions   Banana Nausea And Vomiting   Codeine Nausea Only    unless given with Phenergan   Klonopin [Clonazepam] Other (See Comments)    Causes hallucination    Meperidine Hcl Nausea Only    unless given with Phenergan   Norflex [Orphenadrine Citrate] Nausea Only    Unless given with Phenergan   Oxycodone-Acetaminophen Nausea Only    unless given with phenergan   Propoxyphene Hcl Nausea Only    unless given with phenergan   Zoloft [Sertraline Hcl] Other (See Comments)    Caused lethargy   Doxycycline Other (See Comments)    Unknown reaction   Naproxen Other (See Comments)    Unknown reaction   Penicillins Other (See Comments)    Unknown reaction Has patient had a PCN reaction causing immediate rash, facial/tongue/throat swelling, SOB or lightheadedness with hypotension: Unknown Has patient had a PCN reaction causing severe rash involving mucus membranes or skin necrosis: Unknown Has patient had a PCN reaction that required hospitalization: pt was in the hospital at time of reaction Has patient had a PCN reaction occurring within the last 10 years: Unknown If all of the above answers are "NO", then may proceed with Cephalos   Phenothiazines Other (See Comments)    Unknown reaction   Stelazine Other (See Comments)    Unknown reaction   Sulfamethoxazole-Trimethoprim Other (See Comments)    Unknown reaction   Tolectin [Tolmetin Sodium] Other (See Comments)     Unknown reaction   Tramadol Other (See Comments)    Unknown reaction    Level of Care/Admitting Diagnosis ED Disposition     ED Disposition  Admit   Condition  --   Comment  Hospital Area: [facility] [[id number]]  Level of Care: Telemetry Surgical [[id number]]  May admit patient to [facility] or [facility] if equivalent level of care is available:: Yes  Covid Evaluation: Asymptomatic - no recent exposure (last 10 days) testing not required  Diagnosis: Liver mass [295621]  Admitting Physician: Darlin Drop [[id number]]  Attending Physician: Darlin Drop [[id number]]  Certification:: I certify this patient will need inpatient services for at least 2 midnights  Estimated Length of Stay: 2          B Medical/Surgery History Past Medical History:  Diagnosis Date   Abnormality of gait    Adenomatous polyp of colon 2002   7mm   Allergic rhinitis    Anxiety    Anxiety and depression    Chronic back pain    Dementia without behavioral disturbance (HCC)    Depression    Diabetes (HCC)    Diverticulosis of colon    Dry eye syndrome    Dysphagia    Dysthymic disorder    Fall    Fibromyalgia    GERD (gastroesophageal reflux disease)    H/O hiatal hernia    History of adverse drug reaction    History of cerebrovascular disease 09/24/2014   History  of recurrent UTIs    Hypertension, benign    Irritable bowel syndrome    Low back pain syndrome    Memory loss    Mitral valve prolapse    Paroxysmal A-fib (HCC)    Peripheral neuropathy    "both feet and legs"   Physical deconditioning    Sjogren's syndrome (HCC)    Therapeutic opioid-induced constipation (OIC)    Thyroid nodule    Urinary incontinence    Past Surgical History:  Procedure Laterality Date   ABDOMINAL HYSTERECTOMY  1967   APPENDECTOMY     CARDIAC CATHETERIZATION  02/17/2003   normal L main, LAD free of disease, Cfx free of disease, RCA free of disease (Dr. Mervyn Skeeters. [place])   CATARACT EXTRACTION,  BILATERAL     CHOLECYSTECTOMY  2000   COLONOSCOPY W/ BIOPSIES     multiple   DENTAL SURGERY     multiple tooth extractions   ESOPHAGOGASTRODUODENOSCOPY (EGD) WITH ESOPHAGEAL DILATION N/A 08/23/2012   Procedure: ESOPHAGOGASTRODUODENOSCOPY (EGD) WITH ESOPHAGEAL DILATION;  Surgeon: Rachael Fee, MD;  Location: [facility];  Service: Endoscopy;  Laterality: N/A;   NASAL SEPTUM SURGERY  1980   NM MYOCAR PERF WALL MOTION  2003   persantine - normal static and dynamic study w/apical thinning and presvered LV function, no ischemia   SINUS EXPLORATION     ossifiying fibroma   TEMPOROMANDIBULAR JOINT SURGERY  1986   Dr. Warren Danes   TRANSTHORACIC ECHOCARDIOGRAM  2001   mild LVH, normal LV     A IV Location/Drains/Wounds Patient Lines/Drains/Airways Status     Active Line/Drains/Airways     Name Placement date Placement time Site Days   Peripheral IV 06/15/22 22 G 1" Right;Medial Antecubital 06/15/22  1650  Antecubital  less than 1   External Urinary Catheter 04/24/20  1803  --  782            Intake/Output Last 24 hours No intake or output data in the 24 hours ending 06/15/22 2033  Labs/Imaging Results for orders placed or performed during the hospital encounter of 06/15/22 (from the past 48 hour(s))  Lipase, blood     Status: None   Collection Time: 06/15/22  1:14 PM  Result Value Ref Range   Lipase 47 11 - 51 U/L    Comment: Performed at [facility], [address] [place], [place] [address]  Comprehensive metabolic panel     Status: Abnormal   Collection Time: 06/15/22  1:14 PM  Result Value Ref Range   Sodium 133 (L) 135 - 145 mmol/L   Potassium 3.9 3.5 - 5.1 mmol/L   Chloride 100 98 - 111 mmol/L   CO2 25 22 - 32 mmol/L   Glucose, Bld 120 (H) 70 - 99 mg/dL    Comment: Glucose reference range applies only to samples taken after fasting for at least 8 hours.   BUN 19 8 - 23 mg/dL   Creatinine, Ser 1.30 0.44 - 1.00 mg/dL   Calcium 9.8 8.9 - 86.5 mg/dL   Total  Protein 7.4 6.5 - 8.1 g/dL   Albumin 3.9 3.5 - 5.0 g/dL   AST 41 15 - 41 U/L   ALT 52 (H) 0 - 44 U/L   Alkaline Phosphatase 126 38 - 126 U/L   Total Bilirubin 0.7 0.3 - 1.2 mg/dL   GFR, Estimated >78 >46 mL/min    Comment: (NOTE) Calculated using the CKD-EPI Creatinine Equation (2021)    Anion gap 8 5 - 15  Comment: Performed at [facility], [address], [place], [place] [address]  CBC     Status: None   Collection Time: 06/15/22  1:14 PM  Result Value Ref Range   WBC 5.5 4.0 - 10.5 K/uL   RBC 3.89 3.87 - 5.11 MIL/uL   Hemoglobin 12.0 12.0 - 15.0 g/dL   HCT 60.4 54.0 - 98.1 %   MCV 95.4 80.0 - 100.0 fL   MCH 30.8 26.0 - 34.0 pg   MCHC 32.3 30.0 - 36.0 g/dL   RDW 19.1 47.8 - 29.5 %   Platelets 216 150 - 400 K/uL   nRBC 0.0 0.0 - 0.2 %    Comment: Performed at [facility], [address] [place], [place] [address]   *Note: Due to a large number of results and/or encounters for the requested time period, some results have not been displayed. A complete set of results can be found in Results Review.   CT ABDOMEN PELVIS W CONTRAST  Result Date: 06/15/2022 CLINICAL DATA:  Right lower quadrant abdominal pain. Known right inferior hepatic mass. EXAM: CT ABDOMEN AND PELVIS WITH CONTRAST TECHNIQUE: Multidetector CT imaging of the abdomen and pelvis was performed using the standard protocol following bolus administration of intravenous contrast. RADIATION DOSE REDUCTION: This exam was performed according to the departmental dose-optimization program which includes automated exposure control, adjustment of the mA and/or kV according to patient size and/or use of iterative reconstruction technique. CONTRAST:  65mL OMNIPAQUE IOHEXOL 350 MG/ML SOLN COMPARISON:  02/14/2021 FINDINGS: Lower chest: No acute abnormality. Hepatobiliary: Enlargement rounded shape mass within the inferior tip of the right lobe of the liver causing increased capsular distension and now measuring roughly 9.9  x 9.4 x 9.2 cm. The mass demonstrates heterogeneous enhancement with areas of focal internal necrosis. Due to the mass, the inferior extent of the right lobe of the liver extends into the upper right pelvis. No evidence by CT of spontaneous rupture or bleed of the mass. The rest of the liver is unremarkable without evidence of biliary dilatation. The gallbladder has been removed. Pancreas: Stable atrophic pancreas. Spleen: Normal in size without focal abnormality. Adrenals/Urinary Tract: Adrenal glands are unremarkable. No hydronephrosis. No calculi or solid renal masses. Bladder is unremarkable. Stomach/Bowel: Bowel shows no evidence of obstruction, ileus, inflammation or lesion. The appendix is absent. No free intraperitoneal air. Stable diverticulosis of the sigmoid colon without evidence of diverticulitis. Vascular/Lymphatic: No significant vascular findings are present. No enlarged abdominal or pelvic lymph nodes. Reproductive: Status post hysterectomy. No adnexal masses. Other: Stable small left inguinal hernia containing fat. No abscess or ascites. Musculoskeletal: Stable degenerative disc disease at L4-5 and L5-S1. Osteopenia without lumbar or lower thoracic compression fractures. IMPRESSION: 1. Enlargement of rounded shape mass within the inferior tip of the right lobe of the liver causing increased capsular distension and now measuring roughly 9.9 x 9.4 x 9.2 cm. The mass demonstrates heterogeneous enhancement with areas of focal internal necrosis. No evidence by CT of spontaneous rupture or bleed of the mass. The increased capsular distension of the liver is likely the cause of right lower abdominal pain. Based on appearance, this mass most likely represents a hepatocellular carcinoma. 2. Stable diverticulosis of the sigmoid colon without evidence of diverticulitis. 3. Stable small left inguinal hernia containing fat. 4. Osteopenia and degenerative disc disease at L4-5 and L5-S1. Electronically Signed    By: Irish Lack M.D.   On: 06/15/2022 17:22    Pending Labs [employer] (  From admission, onward)     Start     Ordered   06/22/22 0500  Creatinine, serum  (enoxaparin (LOVENOX)    CrCl >/= 30 ml/min)  Weekly,   R     Comments: while on enoxaparin therapy    06/15/22 2006   06/15/22 2027  AFP tumor marker  Add-on,   AD        06/15/22 2026   06/15/22 2005  CBC  (enoxaparin (LOVENOX)    CrCl >/= 30 ml/min)  Once,   R       Comments: Baseline for enoxaparin therapy IF NOT ALREADY DRAWN.  Notify MD if PLT < 100 K.    06/15/22 2006   06/15/22 2005  Creatinine, serum  (enoxaparin (LOVENOX)    CrCl >/= 30 ml/min)  Once,   R       Comments: Baseline for enoxaparin therapy IF NOT ALREADY DRAWN.    06/15/22 2006   06/15/22 1314  Urinalysis, Routine w reflex microscopic -Urine, Clean Catch  Once,   URGENT       Question:  Specimen Source  Answer:  Urine, Clean Catch   06/15/22 1314            Vitals/Pain Today's Vitals   06/15/22 1312 06/15/22 1715 06/15/22 1729 06/15/22 1815  BP:  (!) 132/98 138/68 122/71  Pulse:  66 64 62  Resp:  18 16 (!) 23  Temp:   (!) 97.4 F (36.3 C)   TempSrc:   Oral   SpO2:  100% 97% 99%  Weight: 62.6 kg     Height: 5\' 3"  (1.6 m)     PainSc: 10-Worst pain ever       Isolation Precautions No active isolations  Medications Medications  enoxaparin (LOVENOX) injection 40 mg (has no administration in time range)  acetaminophen (TYLENOL) tablet 650 mg (has no administration in time range)  oxyCODONE (Oxy IR/ROXICODONE) immediate release tablet 5 mg (has no administration in time range)  HYDROmorphone (DILAUDID) injection 0.5 mg (has no administration in time range)  prochlorperazine (COMPAZINE) injection 5 mg (has no administration in time range)  melatonin tablet 5 mg (has no administration in time range)  polyethylene glycol (MIRALAX / GLYCOLAX) packet 17 g (has no administration in time range)  senna-docusate (Senokot-S) tablet 1 tablet  (has no administration in time range)  0.9 %  sodium chloride infusion (has no administration in time range)  iohexol (OMNIPAQUE) 350 MG/ML injection 65 mL (65 mLs Intravenous Contrast Given 06/15/22 1706)  fentaNYL (SUBLIMAZE) injection 25 mcg (25 mcg Intravenous Given 06/15/22 1901)    Mobility      Focused Assessments    R Recommendations: See Admitting Provider Note  Report given to:   Additional Notes:  pt is alert to baseline. Family at the bedside

## 2022-06-16 ENCOUNTER — Observation Stay (HOSPITAL_COMMUNITY): Payer: Medicare Other

## 2022-06-16 DIAGNOSIS — I4891 Unspecified atrial fibrillation: Secondary | ICD-10-CM | POA: Diagnosis not present

## 2022-06-16 DIAGNOSIS — F418 Other specified anxiety disorders: Secondary | ICD-10-CM

## 2022-06-16 DIAGNOSIS — I480 Paroxysmal atrial fibrillation: Secondary | ICD-10-CM | POA: Diagnosis not present

## 2022-06-16 DIAGNOSIS — C220 Liver cell carcinoma: Secondary | ICD-10-CM | POA: Diagnosis not present

## 2022-06-16 DIAGNOSIS — Z906 Acquired absence of other parts of urinary tract: Secondary | ICD-10-CM

## 2022-06-16 DIAGNOSIS — E119 Type 2 diabetes mellitus without complications: Secondary | ICD-10-CM

## 2022-06-16 DIAGNOSIS — Z90710 Acquired absence of both cervix and uterus: Secondary | ICD-10-CM

## 2022-06-16 DIAGNOSIS — R160 Hepatomegaly, not elsewhere classified: Secondary | ICD-10-CM | POA: Diagnosis not present

## 2022-06-16 DIAGNOSIS — F0390 Unspecified dementia without behavioral disturbance: Secondary | ICD-10-CM | POA: Diagnosis not present

## 2022-06-16 DIAGNOSIS — M3500 Sicca syndrome, unspecified: Secondary | ICD-10-CM

## 2022-06-16 LAB — COMPREHENSIVE METABOLIC PANEL
ALT: 50 U/L — ABNORMAL HIGH (ref 0–44)
AST: 38 U/L (ref 15–41)
Albumin: 3.5 g/dL (ref 3.5–5.0)
Alkaline Phosphatase: 120 U/L (ref 38–126)
Anion gap: 7 (ref 5–15)
BUN: 15 mg/dL (ref 8–23)
CO2: 27 mmol/L (ref 22–32)
Calcium: 9.5 mg/dL (ref 8.9–10.3)
Chloride: 103 mmol/L (ref 98–111)
Creatinine, Ser: 0.7 mg/dL (ref 0.44–1.00)
GFR, Estimated: >60 mL/min (ref >60)
Glucose, Bld: 94 mg/dL (ref 70–99)
Potassium: 3.5 mmol/L (ref 3.5–5.1)
Sodium: 137 mmol/L (ref 135–145)
Total Bilirubin: 0.5 mg/dL (ref 0.3–1.2)
Total Protein: 6.5 g/dL (ref 6.5–8.1)

## 2022-06-16 LAB — ECHOCARDIOGRAM COMPLETE
Height: 63 in
S' Lateral: 2.6 cm
Weight: 2208 oz

## 2022-06-16 LAB — MAGNESIUM: Magnesium: 2.3 mg/dL (ref 1.7–2.4)

## 2022-06-16 LAB — CBC
HCT: 35.5 % — ABNORMAL LOW (ref 36.0–46.0)
Hemoglobin: 11.7 g/dL — ABNORMAL LOW (ref 12.0–15.0)
MCH: 31 pg (ref 26.0–34.0)
MCHC: 33 g/dL (ref 30.0–36.0)
MCV: 94.2 fL (ref 80.0–100.0)
Platelets: 193 10*3/uL (ref 150–400)
RBC: 3.77 MIL/uL — ABNORMAL LOW (ref 3.87–5.11)
RDW: 12.9 % (ref 11.5–15.5)
WBC: 6.1 10*3/uL (ref 4.0–10.5)
nRBC: 0 % (ref 0.0–0.2)

## 2022-06-16 LAB — PHOSPHORUS: Phosphorus: 3.5 mg/dL (ref 2.5–4.6)

## 2022-06-16 LAB — TSH: TSH: 2.112 u[IU]/mL (ref 0.350–4.500)

## 2022-06-16 MED ORDER — BISACODYL 5 MG PO TBEC: 5.0000 mg | DELAYED_RELEASE_TABLET | Freq: Every day | ORAL | Status: DC | PRN

## 2022-06-16 MED ORDER — DULOXETINE HCL 30 MG PO CPEP
30.0000 mg | ORAL_CAPSULE | Freq: Every day | ORAL | Status: DC
  Administered 2022-06-16 – 2022-06-19 (×4): 30 mg via ORAL
  Filled 2022-06-16 (×4): qty 1

## 2022-06-16 MED ORDER — SACCHAROMYCES BOULARDII 250 MG PO CAPS
250.0000 mg | ORAL_CAPSULE | Freq: Every day | ORAL | Status: DC
  Administered 2022-06-16 – 2022-06-19 (×4): 250 mg via ORAL
  Filled 2022-06-16 (×5): qty 1

## 2022-06-16 MED ORDER — PREGABALIN 100 MG PO CAPS
100.0000 mg | ORAL_CAPSULE | Freq: Two times a day (BID) | ORAL | Status: DC
  Administered 2022-06-16 – 2022-06-19 (×7): 100 mg via ORAL
  Filled 2022-06-16 (×7): qty 1

## 2022-06-16 MED ORDER — ALPRAZOLAM 0.5 MG PO TABS: 0.2500 mg | ORAL_TABLET | Freq: Two times a day (BID) | ORAL | Status: DC | PRN

## 2022-06-16 MED ORDER — HYDROMORPHONE HCL 1 MG/ML IJ SOLN: 0.5000 mg | Freq: Three times a day (TID) | INTRAMUSCULAR | Status: DC | PRN

## 2022-06-16 MED ORDER — OLANZAPINE 10 MG IM SOLR
2.5000 mg | Freq: Once | INTRAMUSCULAR | Status: AC
  Administered 2022-06-16: 2.5 mg via INTRAMUSCULAR
  Filled 2022-06-16: qty 10

## 2022-06-16 MED ORDER — METOPROLOL TARTRATE 5 MG/5ML IV SOLN: 2.5000 mg | INTRAVENOUS | Status: DC | PRN

## 2022-06-16 MED ORDER — OYSTER SHELL CALCIUM/D3 500-5 MG-MCG PO TABS
1.0000 | ORAL_TABLET | Freq: Every day | ORAL | Status: DC
  Administered 2022-06-16 – 2022-06-19 (×4): 1 via ORAL
  Filled 2022-06-16 (×4): qty 1

## 2022-06-16 MED ORDER — ASPIRIN 325 MG PO TABS
325.0000 mg | ORAL_TABLET | Freq: Every evening | ORAL | Status: DC
  Administered 2022-06-16 – 2022-06-18 (×3): 325 mg via ORAL
  Filled 2022-06-16 (×3): qty 1

## 2022-06-16 MED ORDER — DULOXETINE HCL 60 MG PO CPEP
60.0000 mg | ORAL_CAPSULE | Freq: Every evening | ORAL | Status: DC
  Administered 2022-06-16 – 2022-06-18 (×3): 60 mg via ORAL
  Filled 2022-06-16 (×3): qty 1

## 2022-06-16 MED ORDER — POTASSIUM CHLORIDE CRYS ER 20 MEQ PO TBCR
20.0000 meq | EXTENDED_RELEASE_TABLET | Freq: Every day | ORAL | Status: DC
  Administered 2022-06-16 – 2022-06-19 (×4): 20 meq via ORAL
  Filled 2022-06-16 (×4): qty 1

## 2022-06-16 MED ORDER — MIRABEGRON ER 25 MG PO TB24
50.0000 mg | ORAL_TABLET | Freq: Every day | ORAL | Status: DC
  Administered 2022-06-16 – 2022-06-19 (×4): 50 mg via ORAL
  Filled 2022-06-16 (×4): qty 2

## 2022-06-16 MED ORDER — ACETAMINOPHEN 500 MG PO TABS
1000.0000 mg | ORAL_TABLET | Freq: Two times a day (BID) | ORAL | Status: DC
  Administered 2022-06-16 – 2022-06-19 (×7): 1000 mg via ORAL
  Filled 2022-06-16 (×7): qty 2

## 2022-06-16 MED ORDER — DILTIAZEM HCL ER COATED BEADS 180 MG PO CP24
180.0000 mg | ORAL_CAPSULE | Freq: Every day | ORAL | Status: DC
  Administered 2022-06-16 – 2022-06-19 (×4): 180 mg via ORAL
  Filled 2022-06-16 (×4): qty 1

## 2022-06-16 MED ORDER — CARBOXYMETHYLCELLULOSE SODIUM 0.5 % OP SOLN: 1.0000 [drp] | Freq: Four times a day (QID) | OPHTHALMIC | Status: DC

## 2022-06-16 MED ORDER — POLYVINYL ALCOHOL 1.4 % OP SOLN
1.0000 [drp] | Freq: Four times a day (QID) | OPHTHALMIC | Status: DC
  Administered 2022-06-16 – 2022-06-19 (×12): 1 [drp] via OPHTHALMIC
  Filled 2022-06-16 (×2): qty 15

## 2022-06-16 MED ORDER — FUROSEMIDE 40 MG PO TABS
40.0000 mg | ORAL_TABLET | Freq: Every day | ORAL | Status: DC
  Administered 2022-06-16 – 2022-06-19 (×4): 40 mg via ORAL
  Filled 2022-06-16 (×4): qty 1

## 2022-06-16 MED ORDER — LORATADINE 10 MG PO TABS
10.0000 mg | ORAL_TABLET | Freq: Every day | ORAL | Status: DC
  Administered 2022-06-16 – 2022-06-19 (×4): 10 mg via ORAL
  Filled 2022-06-16 (×4): qty 1

## 2022-06-16 MED ORDER — PANTOPRAZOLE SODIUM 40 MG PO TBEC
40.0000 mg | DELAYED_RELEASE_TABLET | Freq: Every day | ORAL | Status: DC
  Administered 2022-06-16 – 2022-06-19 (×4): 40 mg via ORAL
  Filled 2022-06-16 (×4): qty 1

## 2022-06-16 NOTE — Progress Notes (Signed)
  Echocardiogram 2D Echocardiogram has been performed.  Delcie Roch 06/16/2022, 2:05 PM

## 2022-06-16 NOTE — Evaluation (Signed)
Physical Therapy Evaluation Patient Details Name: Ariana [name] MRN: [id number] DOB: [birth year] Today's Date: 06/16/2022  History of Present Illness  Patient is 89 y.o. female lives alone but has caregivers on weekdays from 9 AM to 5 PM, has a walker and a cane which she does not use. PMH of dementia, Sjogren's syndrome, PAF not on anticoagulation, anxiety/depression, GERD, prediabetes, constipation, known liver mass since 2019, recurrent UTIs, presented to the ED on 06/15/2022 with complaints of worsening abdominal pain.  CT abdomen showed findings consistent with liver mass/HCC.  General surgery and oncology were consulted, they plan to review her case with OP follow up per family request.   Clinical Impression  Ariana [name] is 89 y.o. female admitted with above HPI and diagnosis. Patient is currently limited by functional impairments below (see PT problem list). Patient lives alone and reports use of QC for mobility at baseline; pt is unreliable historian and no family present to confirm baseline or assist available. Currently pt requires Min assist for bed mobility, transfers, and short bout of gait. Patient will benefit from continued skilled PT interventions to address impairments and progress independence with mobility. Acute PT will follow and progress as able.        Recommendations for follow up therapy are one component of a multi-disciplinary discharge planning process, led by the attending physician.  Recommendations may be updated based on patient status, additional functional criteria and insurance authorization.  Follow Up Recommendations Can patient physically be transported by private vehicle: Yes     Assistance Recommended at Discharge Frequent or constant Supervision/Assistance  Patient can return home with the following  A lot of help with walking and/or transfers;A lot of help with bathing/dressing/bathroom;Assistance with cooking/housework;Direct supervision/assist for  medications management;Assist for transportation;Help with stairs or ramp for entrance    Equipment Recommendations  (TBD)  Recommendations for Other Services       Functional Status Assessment Patient has had a recent decline in their functional status and demonstrates the ability to make significant improvements in function in a reasonable and predictable amount of time.     Precautions / Restrictions Precautions Precautions: Fall Restrictions Weight Bearing Restrictions: No      Mobility  Bed Mobility Overal bed mobility: Needs Assistance Bed Mobility: Supine to Sit     Supine to sit: Min assist, HOB elevated     General bed mobility comments: cues to guide supine>sit pattern and use of bed rail, min assist to pivot and fully raise trunk upright at EOB.    Transfers Overall transfer level: Needs assistance Equipment used: 1 person hand held assist Transfers: Sit to/from Stand, Bed to chair/wheelchair/BSC Sit to Stand: Min assist   Step pivot transfers: Min assist       General transfer comment: min assist to power up from EOB, pt using bil UE's to initiate. HHA provided for steadying in standing. HHA to guide balance with small steps bed>BSC.    Ambulation/Gait Ambulation/Gait assistance: Min assist Gait Distance (Feet): 6 Feet Assistive device: 1 person hand held assist Gait Pattern/deviations: Step-through pattern, Decreased step length - right, Decreased step length - left, Decreased stride length, Shuffle, Trunk flexed Gait velocity: decr     General Gait Details: HHA to steady gait, pt taking shortened low steps to move BSC>recliner.  Stairs            Wheelchair Mobility    Modified Rankin (Stroke Patients Only)       Balance Overall balance assessment: Needs assistance  Sitting-balance support: Feet supported Sitting balance-Leahy Scale: Good Sitting balance - Comments: pt able to weight shift on BSC to have BM   Standing balance  support: Single extremity supported, Reliant on assistive device for balance, During functional activity Standing balance-Leahy Scale: Poor Standing balance comment: pt reaching for support with mobility, reliant on therapist                             Pertinent Vitals/Pain Pain Assessment Pain Assessment: No/denies pain    Home Living Family/patient expects to be discharged to:: Private residence Living Arrangements: Alone Available Help at Discharge: Family (unsure how much assist is available) Type of Home: House                  Prior Function Prior Level of Function : Patient poor historian/Family not available             Mobility Comments: pt reports using QC for mobility at baseline.       Hand Dominance   Dominant Hand: Right    Extremity/Trunk Assessment   Upper Extremity Assessment Upper Extremity Assessment: Defer to OT evaluation    Lower Extremity Assessment Lower Extremity Assessment: Generalized weakness    Cervical / Trunk Assessment Cervical / Trunk Assessment: Kyphotic  Communication   Communication: HOH  Cognition Arousal/Alertness: Awake/alert Behavior During Therapy: WFL for tasks assessed/performed Overall Cognitive Status: History of cognitive impairments - at baseline                                 General Comments: no family present to confirm mobility level or home set up.        General Comments General comments (skin integrity, edema, etc.): bed saturated in urine at start. pt required assist for hygiene, new sacral patch placed for skin protection.    Exercises     Assessment/Plan    PT Assessment Patient needs continued PT services  PT Problem List Decreased strength;Decreased activity tolerance;Decreased balance;Decreased mobility;Decreased coordination;Decreased cognition;Decreased knowledge of use of DME;Decreased safety awareness;Pain;Decreased skin integrity       PT Treatment  Interventions DME instruction;Gait training;Stair training;Functional mobility training;Therapeutic activities;Therapeutic exercise;Balance training;Neuromuscular re-education;Cognitive remediation;Patient/family education    PT Goals (Current goals can be found in the Care Plan section)  Acute Rehab PT Goals Patient Stated Goal: to regain strength and figure out abdominal pain PT Goal Formulation: With patient Time For Goal Achievement: 06/30/22 Potential to Achieve Goals: Fair    Frequency Min 3X/week     Co-evaluation               AM-PAC PT "6 Clicks" Mobility  Outcome Measure Help needed turning from your back to your side while in a flat bed without using bedrails?: A Little Help needed moving from lying on your back to sitting on the side of a flat bed without using bedrails?: A Little Help needed moving to and from a bed to a chair (including a wheelchair)?: A Little Help needed standing up from a chair using your arms (e.g., wheelchair or bedside chair)?: A Little Help needed to walk in hospital room?: A Little Help needed climbing 3-5 steps with a railing? : Total 6 Click Score: 16    End of Session Equipment Utilized During Treatment: Gait belt Activity Tolerance: Patient tolerated treatment well Patient left: in chair;with call bell/phone within reach;with chair alarm set (lunch  set up in front of pt) Nurse Communication: Mobility status PT Visit Diagnosis: Unsteadiness on feet (R26.81);Muscle weakness (generalized) (M62.81);Difficulty in walking, not elsewhere classified (R26.2)    Time: 0454-0981 PT Time Calculation (min) (ACUTE ONLY): 38 min   Charges:   PT Evaluation $PT Eval Moderate Complexity: 1 Mod PT Treatments $Therapeutic Activity: 23-37 mins        Wynn Maudlin, DPT Acute Rehabilitation Services Office [phone]  06/16/22 4:21 PM

## 2022-06-16 NOTE — Progress Notes (Signed)
    Patient Name: JUNIPER GUITREAU           DOB: [birth year]  MRN: [id number]      Admission Date: 06/15/2022  Attending Provider: Elease Etienne, MD  Primary Diagnosis: Liver mass   Level of care: Telemetry Surgical    CROSS COVER NOTE   Date of Service   06/16/2022   KARMEN KLEIN, 89 y.o. female, was admitted on 06/15/2022 for Liver mass.    HPI/Events of Note   Notified by nursing staff that patient with dementia is agitated, restless, and confused.  Patient also becoming combative and fighting nursing staff.  RN reports that patient has removed medical equipment and has also attempted to get out of bed multiple times.  High fall risk.  Unfortunately, she is not following commands despite multiple attempts at redirection by staff, therefore not a candidate for TeleSitter. Safety sitter has been recommended.   Patient is currently refusing all oral medication and has removed IV. Will trial IM Zyprexa for agitation.  Due to patient's continued agitation and combativeness, she is placing herself and staff at risk.  For the safety of the patient, bilateral wrist restraint has been placed. Restraint to be discontinued as soon as patient is able to safely have them removed.    2200-restraints DC'd, patient behavior improved.  Continue Recruitment consultant use.  Interventions/ Plan   IM Zyprexa Safety sitter        Anthoney Harada, DNP, [facility]

## 2022-06-16 NOTE — Consult Note (Signed)
New Hematology/Oncology Consult   Requesting XB:JYNWG Hongalgi        Reason for Consult: Liver mass  HPI: Ariana [name] presented to the emergency room yesterday with abdominal pain.  She has a known liver mass for several years.  Review of the medical record reveals a right liver mass dating at least 04/04/2018.  Ms. Ariana [name] has a history of dementia and is a poor historian.  Her family is not present this morning.  She complains of pain in the right lower abdomen and a dry mouth.  A CT of the abdomen and pelvis 06/15/2022 revealed enlargement of the right liver mass, now measuring 9.9 x 9.4 x 9.2 cm with heterogenous enhancement and areas of focal internal necrosis.  The mass is felt to most likely represent a hepatocellular carcinoma.   Past Medical History:  Diagnosis Date   Abnormality of gait    Adenomatous polyp of colon 2002   7mm   Allergic rhinitis    Anxiety    Anxiety and depression    Chronic back pain    Dementia without behavioral disturbance (HCC)    Depression    Diabetes (HCC)    Diverticulosis of colon    Dry eye syndrome    Dysphagia    Dysthymic disorder    Fall    Fibromyalgia    GERD (gastroesophageal reflux disease)    H/O hiatal hernia    History of adverse drug reaction    History of cerebrovascular disease 09/24/2014   History of recurrent UTIs    Hypertension, benign    Irritable bowel syndrome    Low back pain syndrome    Memory loss    Mitral valve prolapse    Paroxysmal A-fib (HCC)    Peripheral neuropathy    "both feet and legs"   Physical deconditioning    Sjogren's syndrome (HCC)    Therapeutic opioid-induced constipation (OIC)    Thyroid nodule    Urinary incontinence   :   Past Surgical History:  Procedure Laterality Date   ABDOMINAL HYSTERECTOMY  1967   APPENDECTOMY     CARDIAC CATHETERIZATION  02/17/2003   normal L main, LAD free of disease, Cfx free of disease, RCA free of disease (Dr. Mervyn Skeeters. [place])   CATARACT EXTRACTION,  BILATERAL     CHOLECYSTECTOMY  2000   COLONOSCOPY W/ BIOPSIES     multiple   DENTAL SURGERY     multiple tooth extractions   ESOPHAGOGASTRODUODENOSCOPY (EGD) WITH ESOPHAGEAL DILATION N/A 08/23/2012   Procedure: ESOPHAGOGASTRODUODENOSCOPY (EGD) WITH ESOPHAGEAL DILATION;  Surgeon: Rachael Fee, MD;  Location: [facility];  Service: Endoscopy;  Laterality: N/A;   NASAL SEPTUM SURGERY  1980   NM MYOCAR PERF WALL MOTION  2003   persantine - normal static and dynamic study w/apical thinning and presvered LV function, no ischemia   SINUS EXPLORATION     ossifiying fibroma   TEMPOROMANDIBULAR JOINT SURGERY  1986   Dr. Warren Danes   TRANSTHORACIC ECHOCARDIOGRAM  2001   mild LVH, normal LV  :   Current Facility-Administered Medications:    0.9 %  sodium chloride infusion, , Intravenous, Continuous, Darlin Drop, DO, Last Rate: 50 mL/hr at 06/16/22 0049, New Bag at 06/16/22 0049   acetaminophen (TYLENOL) tablet 650 mg, 650 mg, Oral, Q6H PRN, Darlin Drop, DO, 650 mg at 06/16/22 0044   bisacodyl (DULCOLAX) EC tablet 5 mg, 5 mg, Oral, Daily PRN, Hongalgi, Anand D, MD   DULoxetine (CYMBALTA) DR capsule 30 mg,  30 mg, Oral, Daily, Hall, Carole N, DO   enoxaparin (LOVENOX) injection 40 mg, 40 mg, Subcutaneous, Q24H, Hall, Carole N, DO, 40 mg at 06/16/22 0044   HYDROmorphone (DILAUDID) injection 0.5 mg, 0.5 mg, Intravenous, Q4H PRN, Hall, Carole N, DO   linaclotide (LINZESS) capsule 145 mcg, 145 mcg, Oral, QAC breakfast, Hall, Carole N, DO   melatonin tablet 5 mg, 5 mg, Oral, QHS PRN, Hall, Carole N, DO   memantine (NAMENDA) tablet 10 mg, 10 mg, Oral, BID, Hall, Carole N, DO, 10 mg at 06/16/22 4098   oxyCODONE (Oxy IR/ROXICODONE) immediate release tablet 5 mg, 5 mg, Oral, Q6H PRN, Hall, Carole N, DO   pilocarpine (SALAGEN) tablet 5 mg, 5 mg, Oral, BID, Hall, Carole N, DO, 5 mg at 06/16/22 0050   polyethylene glycol (MIRALAX / GLYCOLAX) packet 17 g, 17 g, Oral, Daily, Hall, Carole N, DO, 17 g at  06/16/22 0044   prochlorperazine (COMPAZINE) injection 5 mg, 5 mg, Intravenous, Q6H PRN, Hall, Carole N, DO   senna-docusate (Senokot-S) tablet 1 tablet, 1 tablet, Oral, BID, Dow Adolph N, DO, 1 tablet at 06/16/22 0046:   DULoxetine  30 mg Oral Daily   enoxaparin (LOVENOX) injection  40 mg Subcutaneous Q24H   linaclotide  145 mcg Oral QAC breakfast   memantine  10 mg Oral BID   pilocarpine  5 mg Oral BID   polyethylene glycol  17 g Oral Daily   senna-docusate  1 tablet Oral BID  :   Allergies  Allergen Reactions   Banana Nausea And Vomiting   Codeine Nausea Only    unless given with Phenergan   Klonopin [Clonazepam] Other (See Comments)    Causes hallucination    Meperidine Hcl Nausea Only    unless given with Phenergan   Norflex [Orphenadrine Citrate] Nausea Only    Unless given with Phenergan   Oxycodone-Acetaminophen Nausea Only    unless given with phenergan   Propoxyphene Hcl Nausea Only    unless given with phenergan   Zoloft [Sertraline Hcl] Other (See Comments)    Caused lethargy   Doxycycline Other (See Comments)    Unknown reaction   Naproxen Other (See Comments)    Unknown reaction   Penicillins Other (See Comments)    Unknown reaction Has patient had a PCN reaction causing immediate rash, facial/tongue/throat swelling, SOB or lightheadedness with hypotension: Unknown Has patient had a PCN reaction causing severe rash involving mucus membranes or skin necrosis: Unknown Has patient had a PCN reaction that required hospitalization: pt was in the hospital at time of reaction Has patient had a PCN reaction occurring within the last 10 years: Unknown If all of the above answers are "NO", then may proceed with Cephalos   Phenothiazines Other (See Comments)    Unknown reaction   Stelazine Other (See Comments)    Unknown reaction   Sulfamethoxazole-Trimethoprim Other (See Comments)    Unknown reaction   Tolectin [Tolmetin Sodium] Other (See Comments)    Unknown  reaction   Tramadol Other (See Comments)    Unknown reaction  :   SOCIAL HISTORY: She reports living alone.  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  Remote history of tobacco use.  No alcohol use.  Review of Systems:  Positives include: Right abdominal pain, pain at the right flank, dry mouth  A complete ROS was otherwise negative.   Physical Exam:  Blood pressure 134/72, pulse (!) 57, temperature 97.7 F (36.5 C), temperature source Oral, resp. rate Marland Kitchen)  23, height 5\' 3"  (1.6 m), weight 138 lb (62.6 kg), SpO2 99 %.  HEENT: Edentulous, no thrush, 1 mm ulcer at the left upper lip Lungs: Clear bilaterally, no respiratory distress Cardiac: Regular rate and rhythm Abdomen: No splenomegaly.  No apparent ascites, soft.  Firm mass in the right mid lateral abdomen  Vascular: No leg edema Lymph nodes: No cervical, supraclavicular, axillary, or inguinal nodes Neurologic: Alert, oriented to place.  Not oriented to year or month.  Follows commands.  The motor exam appears intact in the upper and lower extremities bilaterally Skin: Multiple ulcerated lesions over the extremities Musculoskeletal: Mild tenderness at the lower back Breast: Bilateral breast without mass  LABS:   Recent Labs    06/15/22 1314 06/16/22 0429  WBC 5.5 6.1  HGB 12.0 11.7*  HCT 37.1 35.5*  PLT 216 193     Recent Labs    06/15/22 1314 06/16/22 0429  NA 133* 137  K 3.9 3.5  CL 100 103  CO2 25 27  GLUCOSE 120* 94  BUN 19 15  CREATININE 0.79 0.70  CALCIUM 9.8 9.5      RADIOLOGY:  CT ABDOMEN PELVIS W CONTRAST  Result Date: 06/15/2022 CLINICAL DATA:  Right lower quadrant abdominal pain. Known right inferior hepatic mass. EXAM: CT ABDOMEN AND PELVIS WITH CONTRAST TECHNIQUE: Multidetector CT imaging of the abdomen and pelvis was performed using the standard protocol following bolus administration of intravenous contrast. RADIATION DOSE REDUCTION: This exam was performed according to the  departmental dose-optimization program which includes automated exposure control, adjustment of the mA and/or kV according to patient size and/or use of iterative reconstruction technique. CONTRAST:  65mL OMNIPAQUE IOHEXOL 350 MG/ML SOLN COMPARISON:  02/14/2021 FINDINGS: Lower chest: No acute abnormality. Hepatobiliary: Enlargement rounded shape mass within the inferior tip of the right lobe of the liver causing increased capsular distension and now measuring roughly 9.9 x 9.4 x 9.2 cm. The mass demonstrates heterogeneous enhancement with areas of focal internal necrosis. Due to the mass, the inferior extent of the right lobe of the liver extends into the upper right pelvis. No evidence by CT of spontaneous rupture or bleed of the mass. The rest of the liver is unremarkable without evidence of biliary dilatation. The gallbladder has been removed. Pancreas: Stable atrophic pancreas. Spleen: Normal in size without focal abnormality. Adrenals/Urinary Tract: Adrenal glands are unremarkable. No hydronephrosis. No calculi or solid renal masses. Bladder is unremarkable. Stomach/Bowel: Bowel shows no evidence of obstruction, ileus, inflammation or lesion. The appendix is absent. No free intraperitoneal air. Stable diverticulosis of the sigmoid colon without evidence of diverticulitis. Vascular/Lymphatic: No significant vascular findings are present. No enlarged abdominal or pelvic lymph nodes. Reproductive: Status post hysterectomy. No adnexal masses. Other: Stable small left inguinal hernia containing fat. No abscess or ascites. Musculoskeletal: Stable degenerative disc disease at L4-5 and L5-S1. Osteopenia without lumbar or lower thoracic compression fractures. IMPRESSION: 1. Enlargement of rounded shape mass within the inferior tip of the right lobe of the liver causing increased capsular distension and now measuring roughly 9.9 x 9.4 x 9.2 cm. The mass demonstrates heterogeneous enhancement with areas of focal internal  necrosis. No evidence by CT of spontaneous rupture or bleed of the mass. The increased capsular distension of the liver is likely the cause of right lower abdominal pain. Based on appearance, this mass most likely represents a hepatocellular carcinoma. 2. Stable diverticulosis of the sigmoid colon without evidence of diverticulitis. 3. Stable small left inguinal hernia containing fat. 4. Osteopenia  and degenerative disc disease at L4-5 and L5-S1. Electronically Signed   By: Irish Lack M.D.   On: 06/15/2022 17:22    Assessment and Plan:   Right liver mass CT abdomen/pelvis 06/15/2022-enlargement of rounded mass in the inferior tip of the right liver, now measuring 9.9 x 9.4 x 9.2 cm, most consistent with a hepatocellular carcinoma Right liver mass present on multiple CT examinations dating to September 2020  2.  Pain secondary to #1 3.  Dementia 4.  History of atrial fibrillation 5.  Sjogren syndrome 6.  Recurrent urinary tract infection 7.  Anxiety/depression 8.  Diabetes 9.  Hysterectomy 10.  Cystectomy  Ms. Reuss is admitted with right abdominal pain secondary to an enlarging right liver mass.  The mass has been present for several years and has enlarged.  The mass most likely represents a hepatocellular carcinoma, though she does not have known risk factors for cirrhosis.  The differential diagnosis includes cholangiocarcinoma or metastatic disease from another primary tumor site, but these are less likely.  She underwent a diagnostic MRI of the abdomen last night and the result is pending.  Ms. Wigand was evaluated under Freida Busman earlier today.  The mass appears resectable, but Ms. Panda does not appear to be a surgical candidate. If the patient and family desire treatment I recommend consultation with interventional radiology to consider embolization.  I am available to discuss the case with her family if desired  Recommendations: Follow-up MRI liver and AFP level to establish  diagnosis of hepatocellular carcinoma Consult interventional radiology to consider embolization if palliative treatment is desired Case will be presented to GI tumor conference on 06/21/2022 Trial of oxycodone as needed for pain Please call Oncology as needed.  I will check on her 06/19/2022 if she remains in the hospital   Thornton Papas, MD 06/16/2022, 6:16 AM

## 2022-06-16 NOTE — Progress Notes (Signed)
PROGRESS NOTE   Ariana [name]  ZOX:[id number]    DOB: [birth year]    DOA: 06/15/2022  PCP: Caesar Bookman, NP   I have briefly reviewed patients previous medical records in [facility].  Chief Complaint  Patient presents with   Abdominal Pain    Brief Narrative:  89 year old female, lives alone but has caregivers on weekdays from 9 AM to 5 PM, has a walker and a cane which she does not use, PMH of dementia, Sjogren's syndrome, PAF not on anticoagulation, chronic anxiety/depression, GERD, prediabetes, chronic constipation, known liver mass since 2019, recurrent UTIs, presented to the ED on 06/15/2022 with complaints of worsening abdominal pain.  CT abdomen showed findings consistent with liver mass/HCC.  General surgery and oncology were consulted, they plan to review her case at multidisciplinary tumor board next week.  Family wishes to follow-up with them as outpatient after that meeting.   Assessment & Plan:  Principal Problem:   Liver mass   Right liver mass, most likely hepatocellular carcinoma: The mass apparently has been present for several years and has enlarged.  No prior MRI or biopsy.  Although no history of cirrhosis, current imaging shows cirrhosis.  CT abdomen: 9.9 x 9.4 x 9.2 cm rounded shaped mass within the inferior tip of the right lobe of the liver with heterogeneous enhancement with areas of focal internal necrosis, based on appearance, mass most likely represents hepatocellular carcinoma.  MRI of liver: Cirrhotic liver with large enlarging aggressive appearing neoplasm in the inferior aspect of the right lobe of the liver which has imaging characteristics categorized as LR-5 definitive hepatocellular carcinoma.  AFP, CA 19-9 pending.  Acute hepatitis panel negative.  General surgery and medical oncology input appreciated.  She is added to the multidisciplinary tumor board discussion for 06/21/2022.  As per general surgery consultation, family likely would not want very  invasive interventions and surgery may explore less invasive options such as percutaneous ablation or Y90.  As per medical oncology consultation, the mass appears resectable, but patient does not appear to be a surgical candidate and if the patient and family desire treatment, they recommend consultation with IR to consider embolization.  Multimodality pain control with least amount of opioids as needed given history of dementia and advanced age.  Bowel regimen.  Discussed extensively with patient's daughter/healthcare power of attorney on 5/2, prefers to wait until multidisciplinary tumor board discussion on 5/8 prior to proceeding with treatment.  Will consult palliative care team but if unable to see prior to DC, this can be pursued at the cancer center, for GOC discussions.  PAF with RVR: Not on anticoagulation PTA.  TTE in 2019 showed LVEF of 60-65%.  Repeat 2D echo.  Continue telemetry.  Resume home dose of Cardizem CD 180 mg daily.  As needed IV metoprolol.  If does not control with above, may need to adjust meds further.  Patient also on aspirin 325 Mg daily, continue  Chronic constipation in the setting of large liver mass Irritable bowel syndrome-constipation Continue home Linzess.  Also on MiraLAX and Senokot-S.  CT abdomen did not comment on stool burden.   Sjogren's disease Resume home regimen/pilocarpine 5 Mg twice daily Aspiration precautions   Chronic anxiety/depression Dementia Continue Namenda, Cymbalta 60 mg in the evening and 30 mg in the morning, Xanax as needed.  Also on Lyrica Fall precautions, aspiration precautions, delirium precautions.   GERD Continue PPI.   HFpEF 60-65% from 2D echo done in 2019 Euvolemic on exam Continue  prior home dose of Lasix and potassium supplements.   History of recurrent UTI UA not suggestive of UTI.  Continue home dose of Myrbetriq.   Prediabetes Last hemoglobin A1c 6.2 on 02/03/2022. Heart healthy, carb modified diet Insulin  sliding scale for hyperglycemia. Januvia on hold.    Body mass index is 24.45 kg/m.   ACP Documents: None present. DVT prophylaxis: enoxaparin (LOVENOX) injection 40 mg Start: 06/15/22 2015     Code Status: DNR:  Family Communication:  Disposition:  Status is: Observation The patient remains OBS appropriate and will d/c before 2 midnights.     Consultants:     Procedures:     Antimicrobials:      Subjective:  Not a great historian.  States that she has been having RUQ abdominal pain for the past several weeks.  No nausea or vomiting.  Unclear as to when she had a last BM.  Rest of history as noted above.  Objective:   Vitals:   06/15/22 1815 06/15/22 2206 06/16/22 0343 06/16/22 0737  BP: 122/71 (!) 152/66 134/72 (!) 145/70  Pulse: 62 (!) 57 (!) 57 72  Resp: (!) 23   16  Temp:  (!) 97.5 F (36.4 C) 97.7 F (36.5 C) 97.9 F (36.6 C)  TempSrc:  Oral Oral Oral  SpO2: 99% 99% 99% 97%  Weight:      Height:        General exam: Elderly female, moderately built, frail and thinly nourished, lying comfortably propped up in bed without distress.  Oral mucosa moist. Respiratory system: Clear to auscultation. Respiratory effort normal. Cardiovascular system: S1 & S2 heard, irregularly irregular and tachycardic. No JVD, murmurs, rubs, gallops or clicks. No pedal edema.  Telemetry personally reviewed: Was in sinus rhythm overnight and at approximately 7:40 AM on 5/3, went into A-fib with mild RVR with ventricular rates ranging from 110-130s. Gastrointestinal system: Abdomen is nondistended, soft.  Nondiscrete right upper quadrant mildly tender mass, globular, nonpulsatile.  No other masses or organomegaly appreciated.  Normal bowel sounds heard. Central nervous system: Alert and oriented x 2. No focal neurological deficits. Extremities: Symmetric 5 x 5 power. Skin: No rashes, lesions or ulcers Psychiatry: Judgement and insight impaired. Mood & affect appropriate.     Data  Reviewed:   I have personally reviewed following labs and imaging studies   CBC: Recent Labs  Lab 06/15/22 1314 06/16/22 0429  WBC 5.5 6.1  HGB 12.0 11.7*  HCT 37.1 35.5*  MCV 95.4 94.2  PLT 216 193    Basic Metabolic Panel: Recent Labs  Lab 06/15/22 1314 06/16/22 0429  NA 133* 137  K 3.9 3.5  CL 100 103  CO2 25 27  GLUCOSE 120* 94  BUN 19 15  CREATININE 0.79 0.70  CALCIUM 9.8 9.5  MG  --  2.3  PHOS  --  3.5    Liver Function Tests: Recent Labs  Lab 06/15/22 1314 06/16/22 0429  AST 41 38  ALT 52* 50*  ALKPHOS 126 120  BILITOT 0.7 0.5  PROT 7.4 6.5  ALBUMIN 3.9 3.5    CBG: No results for input(s): "GLUCAP" in the last 168 hours.  Microbiology Studies:  No results found for this or any previous visit (from the past 240 hour(s)).  Radiology Studies:  MR LIVER W WO CONTRAST  Result Date: 06/16/2022 CLINICAL DATA:  89 year old female with suspected hepatocellular carcinoma. EXAM: MRI ABDOMEN WITHOUT AND WITH CONTRAST TECHNIQUE: Multiplanar multisequence MR imaging of the abdomen was performed both  before and after the administration of intravenous contrast. CONTRAST:  6mL GADAVIST GADOBUTROL 1 MMOL/ML IV SOLN COMPARISON:  Abdominal MRI 03/31/2019. CT of the abdomen and pelvis 06/15/2022. FINDINGS: Lower chest: Unremarkable. Hepatobiliary: Liver has a slightly shrunken appearance and nodular contour, suggesting underlying cirrhosis. Again noted is a large mass in the inferior aspect of the right lobe of the liver involving portions of segments 5 and 6 (axial image 118 of series 902 and coronal image 64 of series 10) estimated to measure 9.6 x 9.6 x 9.4 cm. This lesion is heterogeneous in signal intensity on T1 and T2 weighted images, and demonstrates heterogeneous enhancement on post gadolinium images with some areas that are hypervascular and other areas that appear hypovascular during arterial phase imaging. There does appear to be a persistent area of peripheral  enhancement on delayed post gadolinium imaging. Small internal areas of T1 hyperintensity are noted on precontrast imaging (axial image 133 of series 900), indicative of some internal areas of hemorrhage. No other new hepatic lesions are noted. No intrahepatic biliary ductal dilatation. Common bile duct is mildly dilated measuring 9 mm in the porta hepatis, which is within normal limits given the patient's age. No filling defects in the common bile duct to suggest choledocholithiasis. Status post cholecystectomy. Pancreas: No pancreatic mass. No pancreatic ductal dilatation. No pancreatic or peripancreatic fluid collections or inflammatory changes. Spleen:  Unremarkable. Adrenals/Urinary Tract: Right kidney and bilateral adrenal glands are normal in appearance. Multiple small lesions are noted throughout the left kidney ranging from T1 hypointense and T2 hyperintense with no enhancement, to T1 hyperintense and T2 hypointense with no enhancement, compatible with a combination of Bosniak class 1 and Bosniak class 2 cysts (benign requiring no future imaging follow-up). No hydroureteronephrosis in the visualized portions of the abdomen. Stomach/Bowel: Visualized portions are unremarkable. Vascular/Lymphatic: No aneurysm identified in the visualized abdominal vasculature. No lymphadenopathy noted in the abdomen. Other: No significant volume of ascites noted in the visualized portions of the peritoneal cavity. Musculoskeletal: No aggressive appearing osseous lesions are noted in the visualized portions of the skeleton. Cavernous hemangioma in the L3 vertebral body incidentally noted. IMPRESSION: 1. Cirrhotic liver with large enlarging aggressive appearing neoplasm in the inferior aspect of the right lobe of the liver which has imaging characteristics (size, enhancing capsule and threshold growth) categorized as LR-5 definitive hepatocellular carcinoma. Correlation with pending AFP levels is recommended. 2. Additional  incidental findings, as above. Electronically Signed   By: Trudie Reed M.D.   On: 06/16/2022 08:55   CT ABDOMEN PELVIS W CONTRAST  Result Date: 06/15/2022 CLINICAL DATA:  Right lower quadrant abdominal pain. Known right inferior hepatic mass. EXAM: CT ABDOMEN AND PELVIS WITH CONTRAST TECHNIQUE: Multidetector CT imaging of the abdomen and pelvis was performed using the standard protocol following bolus administration of intravenous contrast. RADIATION DOSE REDUCTION: This exam was performed according to the departmental dose-optimization program which includes automated exposure control, adjustment of the mA and/or kV according to patient size and/or use of iterative reconstruction technique. CONTRAST:  65mL OMNIPAQUE IOHEXOL 350 MG/ML SOLN COMPARISON:  02/14/2021 FINDINGS: Lower chest: No acute abnormality. Hepatobiliary: Enlargement rounded shape mass within the inferior tip of the right lobe of the liver causing increased capsular distension and now measuring roughly 9.9 x 9.4 x 9.2 cm. The mass demonstrates heterogeneous enhancement with areas of focal internal necrosis. Due to the mass, the inferior extent of the right lobe of the liver extends into the upper right pelvis. No evidence by CT of spontaneous  rupture or bleed of the mass. The rest of the liver is unremarkable without evidence of biliary dilatation. The gallbladder has been removed. Pancreas: Stable atrophic pancreas. Spleen: Normal in size without focal abnormality. Adrenals/Urinary Tract: Adrenal glands are unremarkable. No hydronephrosis. No calculi or solid renal masses. Bladder is unremarkable. Stomach/Bowel: Bowel shows no evidence of obstruction, ileus, inflammation or lesion. The appendix is absent. No free intraperitoneal air. Stable diverticulosis of the sigmoid colon without evidence of diverticulitis. Vascular/Lymphatic: No significant vascular findings are present. No enlarged abdominal or pelvic lymph nodes. Reproductive: Status  post hysterectomy. No adnexal masses. Other: Stable small left inguinal hernia containing fat. No abscess or ascites. Musculoskeletal: Stable degenerative disc disease at L4-5 and L5-S1. Osteopenia without lumbar or lower thoracic compression fractures. IMPRESSION: 1. Enlargement of rounded shape mass within the inferior tip of the right lobe of the liver causing increased capsular distension and now measuring roughly 9.9 x 9.4 x 9.2 cm. The mass demonstrates heterogeneous enhancement with areas of focal internal necrosis. No evidence by CT of spontaneous rupture or bleed of the mass. The increased capsular distension of the liver is likely the cause of right lower abdominal pain. Based on appearance, this mass most likely represents a hepatocellular carcinoma. 2. Stable diverticulosis of the sigmoid colon without evidence of diverticulitis. 3. Stable small left inguinal hernia containing fat. 4. Osteopenia and degenerative disc disease at L4-5 and L5-S1. Electronically Signed   By: Irish Lack M.D.   On: 06/15/2022 17:22    Scheduled Meds:    acetaminophen  1,000 mg Oral BID   aspirin  325 mg Oral QPM   calcium-vitamin D  1 tablet Oral Daily   diltiazem  180 mg Oral Daily   DULoxetine  60 mg Oral QPM   enoxaparin (LOVENOX) injection  40 mg Subcutaneous Q24H   furosemide  40 mg Oral Daily   linaclotide  145 mcg Oral QAC breakfast   loratadine  10 mg Oral Daily   memantine  10 mg Oral BID   mirabegron ER  50 mg Oral Daily   pantoprazole  40 mg Oral Daily   pilocarpine  5 mg Oral BID   polyethylene glycol  17 g Oral Daily   polyvinyl alcohol  1 drop Both Eyes QID   potassium chloride SA  20 mEq Oral Daily   pregabalin  100 mg Oral BID   saccharomyces boulardii  250 mg Oral Daily   senna-docusate  1 tablet Oral BID    Continuous Infusions:    sodium chloride 50 mL/hr at 06/16/22 0049     LOS: 1 day     Marcellus Scott, MD,  FACP, [facility], [facility], [facility], [facility]   [facility]  & Physician Advisor [place]     To contact the attending provider between 7A-7P or the covering provider during after hours 7P-7A, please log into the web site [email or web address] and access using universal  password for that web site. If you do not have the password, please call the hospital operator.  06/16/2022, 12:18 PM

## 2022-06-16 NOTE — Consult Note (Signed)
[name] 1929-09-17  [id number].    Requesting MD: Curley Spice Chief Complaint/Reason for Consult: liver mass  HPI:  Ariana [name] is a 89 yo female with a history of dementia, Sjogren's syndrome, A-fib and a known liver mass who presented to the ED with worsening abdominal pain.  She is here with her granddaughter, who reports that the patient lives at home but has a caregiver present during weekdays.  The patient's daughter is her healthcare power of attorney.  The patient has had a documented liver mass since 2019, which has been enlarging since that time.  She has had several CT scans, but has not yet had an MRI or tissue biopsy.  She is now having worsening right lower quadrant abdominal pain.  The patient's granddaughter reports that she also has frequent UTIs and has been having urinary symptoms.  She is being admitted to the hospitalist service and UA is pending.  Surgery was consulted for evaluation of her liver mass.  The patient has previously had a hysterectomy and cholecystectomy.  She has no known underlying liver disease.  Her granddaughter reports she does not drink any alcohol and has never been a heavy drinker.  Her LFTs are normal.  ROS: Review of Systems  Constitutional:  Negative for chills and fever.  Gastrointestinal:  Positive for abdominal pain.    Family History  Problem Relation Age of Onset   Heart disease Father        heart attack   Pneumonia Father    Heart attack Mother    Hypertension Mother    Colon cancer Sister    Kidney disease Daughter    Asthma Daughter    Arthritis Daughter 52       osteo,   Heart disease Son 76       stage 3 CHF(Diastolic /Systolic)   Throat cancer Brother    Hypertension Maternal Grandmother     Past Medical History:  Diagnosis Date   Abnormality of gait    Adenomatous polyp of colon 2002   7mm   Allergic rhinitis    Anxiety    Anxiety and depression    Chronic back pain    Dementia without behavioral  disturbance (HCC)    Depression    Diabetes (HCC)    Diverticulosis of colon    Dry eye syndrome    Dysphagia    Dysthymic disorder    Fall    Fibromyalgia    GERD (gastroesophageal reflux disease)    H/O hiatal hernia    History of adverse drug reaction    History of cerebrovascular disease 09/24/2014   History of recurrent UTIs    Hypertension, benign    Irritable bowel syndrome    Low back pain syndrome    Memory loss    Mitral valve prolapse    Paroxysmal A-fib (HCC)    Peripheral neuropathy    "both feet and legs"   Physical deconditioning    Sjogren's syndrome (HCC)    Therapeutic opioid-induced constipation (OIC)    Thyroid nodule    Urinary incontinence     Past Surgical History:  Procedure Laterality Date   ABDOMINAL HYSTERECTOMY  1967   APPENDECTOMY     CARDIAC CATHETERIZATION  02/17/2003   normal L main, LAD free of disease, Cfx free of disease, RCA free of disease (Dr. Mervyn Skeeters. [place])   CATARACT EXTRACTION, BILATERAL     CHOLECYSTECTOMY  2000   COLONOSCOPY W/ BIOPSIES     multiple  DENTAL SURGERY     multiple tooth extractions   ESOPHAGOGASTRODUODENOSCOPY (EGD) WITH ESOPHAGEAL DILATION N/A 08/23/2012   Procedure: ESOPHAGOGASTRODUODENOSCOPY (EGD) WITH ESOPHAGEAL DILATION;  Surgeon: Rachael Fee, MD;  Location: [facility];  Service: Endoscopy;  Laterality: N/A;   NASAL SEPTUM SURGERY  1980   NM MYOCAR PERF WALL MOTION  2003   persantine - normal static and dynamic study w/apical thinning and presvered LV function, no ischemia   SINUS EXPLORATION     ossifiying fibroma   TEMPOROMANDIBULAR JOINT SURGERY  1986   Dr. Warren Danes   TRANSTHORACIC ECHOCARDIOGRAM  2001   mild LVH, normal LV    Social History:  reports that she has quit smoking. She has never used smokeless tobacco. She reports that she does not drink alcohol and does not use drugs.  Allergies:  Allergies  Allergen Reactions   Banana Nausea And Vomiting   Codeine Nausea Only    unless given  with Phenergan   Klonopin [Clonazepam] Other (See Comments)    Causes hallucination    Meperidine Hcl Nausea Only    unless given with Phenergan   Norflex [Orphenadrine Citrate] Nausea Only    Unless given with Phenergan   Oxycodone-Acetaminophen Nausea Only    unless given with phenergan   Propoxyphene Hcl Nausea Only    unless given with phenergan   Zoloft [Sertraline Hcl] Other (See Comments)    Caused lethargy   Doxycycline Other (See Comments)    Unknown reaction   Naproxen Other (See Comments)    Unknown reaction   Penicillins Other (See Comments)    Unknown reaction Has patient had a PCN reaction causing immediate rash, facial/tongue/throat swelling, SOB or lightheadedness with hypotension: Unknown Has patient had a PCN reaction causing severe rash involving mucus membranes or skin necrosis: Unknown Has patient had a PCN reaction that required hospitalization: pt was in the hospital at time of reaction Has patient had a PCN reaction occurring within the last 10 years: Unknown If all of the above answers are "NO", then may proceed with Cephalos   Phenothiazines Other (See Comments)    Unknown reaction   Stelazine Other (See Comments)    Unknown reaction   Sulfamethoxazole-Trimethoprim Other (See Comments)    Unknown reaction   Tolectin [Tolmetin Sodium] Other (See Comments)    Unknown reaction   Tramadol Other (See Comments)    Unknown reaction       Physical Exam: Blood pressure (!) 152/66, pulse (!) 57, temperature (!) 97.5 F (36.4 C), temperature source Oral, resp. rate (!) 23, height 5\' 3"  (1.6 m), weight 62.6 kg, SpO2 99 %. General: resting comfortably, appears stated age, no apparent distress Neurological: alert, mildly confused but answers most questions appropriately HEENT: normocephalic, atraumatic, no scleral icterus CV: regular rate and rhythm, extremities warm and well-perfused Respiratory: normal work of breathing on room air Abdomen: soft,  nondistended, minimally tender to palpation.  Well-healed lower midline scar and right paramedian scar. Extremities: warm and well-perfused, no deformities, moving all extremities spontaneously Skin: warm and dry, no jaundice, no rashes or lesions   Results for orders placed or performed during the hospital encounter of 06/15/22 (from the past 48 hour(s))  Lipase, blood     Status: None   Collection Time: 06/15/22  1:14 PM  Result Value Ref Range   Lipase 47 11 - 51 U/L    Comment: Performed at [facility], [address] [place], [place] [address]  Comprehensive metabolic panel  Status: Abnormal   Collection Time: 06/15/22  1:14 PM  Result Value Ref Range   Sodium 133 (L) 135 - 145 mmol/L   Potassium 3.9 3.5 - 5.1 mmol/L   Chloride 100 98 - 111 mmol/L   CO2 25 22 - 32 mmol/L   Glucose, Bld 120 (H) 70 - 99 mg/dL    Comment: Glucose reference range applies only to samples taken after fasting for at least 8 hours.   BUN 19 8 - 23 mg/dL   Creatinine, Ser 1.61 0.44 - 1.00 mg/dL   Calcium 9.8 8.9 - 09.6 mg/dL   Total Protein 7.4 6.5 - 8.1 g/dL   Albumin 3.9 3.5 - 5.0 g/dL   AST 41 15 - 41 U/L   ALT 52 (H) 0 - 44 U/L   Alkaline Phosphatase 126 38 - 126 U/L   Total Bilirubin 0.7 0.3 - 1.2 mg/dL   GFR, Estimated >04 >54 mL/min    Comment: (NOTE) Calculated using the CKD-EPI Creatinine Equation (2021)    Anion gap 8 5 - 15    Comment: Performed at [facility], [address], [place], [place] [address]  CBC     Status: None   Collection Time: 06/15/22  1:14 PM  Result Value Ref Range   WBC 5.5 4.0 - 10.5 K/uL   RBC 3.89 3.87 - 5.11 MIL/uL   Hemoglobin 12.0 12.0 - 15.0 g/dL   HCT 91.4 78.2 - 95.6 %   MCV 95.4 80.0 - 100.0 fL   MCH 30.8 26.0 - 34.0 pg   MCHC 32.3 30.0 - 36.0 g/dL   RDW 21.3 08.6 - 57.8 %   Platelets 216 150 - 400 K/uL   nRBC 0.0 0.0 - 0.2 %    Comment: Performed at [facility], [address] [place], [place] [address]  Urinalysis,  Routine w reflex microscopic -Urine, Clean Catch     Status: Abnormal   Collection Time: 06/15/22  8:53 PM  Result Value Ref Range   Color, Urine YELLOW YELLOW   APPearance HAZY (A) CLEAR   Specific Gravity, Urine 1.016 1.005 - 1.030   pH 7.0 5.0 - 8.0   Glucose, UA NEGATIVE NEGATIVE mg/dL   Hgb urine dipstick NEGATIVE NEGATIVE   Bilirubin Urine NEGATIVE NEGATIVE   Ketones, ur NEGATIVE NEGATIVE mg/dL   Protein, ur NEGATIVE NEGATIVE mg/dL   Nitrite NEGATIVE NEGATIVE   Leukocytes,Ua MODERATE (A) NEGATIVE   RBC / HPF 0-5 0 - 5 RBC/hpf   WBC, UA 6-10 0 - 5 WBC/hpf   Bacteria, UA NONE SEEN NONE SEEN   Squamous Epithelial / HPF 0-5 0 - 5 /HPF    Comment: Performed at [facility], [address] [place], [place] [address]  Hepatitis panel, acute     Status: None   Collection Time: 06/15/22 10:16 PM  Result Value Ref Range   Hepatitis B Surface Ag NON REACTIVE NON REACTIVE   HCV Ab NON REACTIVE NON REACTIVE    Comment: (NOTE) Nonreactive HCV antibody screen is consistent with no HCV infections,  unless recent infection is suspected or other evidence exists to indicate HCV infection.     Hep A IgM NON REACTIVE NON REACTIVE   Hep B C IgM NON REACTIVE NON REACTIVE    Comment: Performed at [facility], [address] [place], [place] [address]   *Note: Due to a large number of results and/or encounters for the requested time period, some results  have not been displayed. A complete set of results can be found in Results Review.   CT ABDOMEN PELVIS W CONTRAST  Result Date: 06/15/2022 CLINICAL DATA:  Right lower quadrant abdominal pain. Known right inferior hepatic mass. EXAM: CT ABDOMEN AND PELVIS WITH CONTRAST TECHNIQUE: Multidetector CT imaging of the abdomen and pelvis was performed using the standard protocol following bolus administration of intravenous contrast. RADIATION DOSE REDUCTION: This exam was performed according to the departmental dose-optimization program which  includes automated exposure control, adjustment of the mA and/or kV according to patient size and/or use of iterative reconstruction technique. CONTRAST:  65mL OMNIPAQUE IOHEXOL 350 MG/ML SOLN COMPARISON:  02/14/2021 FINDINGS: Lower chest: No acute abnormality. Hepatobiliary: Enlargement rounded shape mass within the inferior tip of the right lobe of the liver causing increased capsular distension and now measuring roughly 9.9 x 9.4 x 9.2 cm. The mass demonstrates heterogeneous enhancement with areas of focal internal necrosis. Due to the mass, the inferior extent of the right lobe of the liver extends into the upper right pelvis. No evidence by CT of spontaneous rupture or bleed of the mass. The rest of the liver is unremarkable without evidence of biliary dilatation. The gallbladder has been removed. Pancreas: Stable atrophic pancreas. Spleen: Normal in size without focal abnormality. Adrenals/Urinary Tract: Adrenal glands are unremarkable. No hydronephrosis. No calculi or solid renal masses. Bladder is unremarkable. Stomach/Bowel: Bowel shows no evidence of obstruction, ileus, inflammation or lesion. The appendix is absent. No free intraperitoneal air. Stable diverticulosis of the sigmoid colon without evidence of diverticulitis. Vascular/Lymphatic: No significant vascular findings are present. No enlarged abdominal or pelvic lymph nodes. Reproductive: Status post hysterectomy. No adnexal masses. Other: Stable small left inguinal hernia containing fat. No abscess or ascites. Musculoskeletal: Stable degenerative disc disease at L4-5 and L5-S1. Osteopenia without lumbar or lower thoracic compression fractures. IMPRESSION: 1. Enlargement of rounded shape mass within the inferior tip of the right lobe of the liver causing increased capsular distension and now measuring roughly 9.9 x 9.4 x 9.2 cm. The mass demonstrates heterogeneous enhancement with areas of focal internal necrosis. No evidence by CT of spontaneous  rupture or bleed of the mass. The increased capsular distension of the liver is likely the cause of right lower abdominal pain. Based on appearance, this mass most likely represents a hepatocellular carcinoma. 2. Stable diverticulosis of the sigmoid colon without evidence of diverticulitis. 3. Stable small left inguinal hernia containing fat. 4. Osteopenia and degenerative disc disease at L4-5 and L5-S1. Electronically Signed   By: Irish Lack M.D.   On: 06/15/2022 17:22      Assessment/Plan This is a 89 yo female presenting with abdominal pain and an enlarging liver mass. I personally reviewed her labs, imaging, and notes. She has a liver mass extending from the inferior edge of the right lobe (segment 6), which is now 9.9cm in size. It was previously a similar size in January of 2023. It was first noted in September 2020, at which time it was 2.6 cm.  It was not present on imaging in 2019.  Imaging features on CT are most consistent with an [facility], although the patient has no known underlying liver disease or signs of cirrhosis on imaging.  The chronicity is more consistent with HCC than a cholangiocarcinoma.  I had a discussion with the patient's granddaughter who was present at bedside.  The patient is of advanced age and relatively frail.  This tumor is technically resectable and would not require a major hepatectomy,  but I am concerned the invasive nature and risks of a liver resection would be very debilitating for this patient.  If the patient and her family wish to pursue treatment, I would recommend exploring less invasive options such as percutaneous ablation or Y90.  The patient's granddaughter agrees that they would likely not want very invasive interventions.  We will first need to confirm a diagnosis to help guide treatment decisions.  A liver MRI is pending as are tumor markers (AFP and CA 19-9).  It would also be reasonable to consult palliative care to assist with goals of care discussions,  however patient's granddaughter does not yet seem very receptive to this.  I will review her case at multidisciplinary tumor board next week.  The remainder of her workup may be completed as an outpatient.   Sophronia Simas, MD [facility] General, Hepatobiliary and Pancreatic Surgery 06/16/22 12:01 AM

## 2022-06-17 DIAGNOSIS — I480 Paroxysmal atrial fibrillation: Secondary | ICD-10-CM | POA: Diagnosis not present

## 2022-06-17 DIAGNOSIS — R160 Hepatomegaly, not elsewhere classified: Secondary | ICD-10-CM | POA: Diagnosis not present

## 2022-06-17 DIAGNOSIS — R410 Disorientation, unspecified: Secondary | ICD-10-CM | POA: Diagnosis not present

## 2022-06-17 DIAGNOSIS — C220 Liver cell carcinoma: Secondary | ICD-10-CM | POA: Diagnosis not present

## 2022-06-17 LAB — AFP TUMOR MARKER: AFP, Serum, Tumor Marker: 3.4 ng/mL (ref 0.0–8.7)

## 2022-06-17 LAB — CANCER ANTIGEN 19-9: CA 19-9: 7 U/mL (ref 0–35)

## 2022-06-17 MED ORDER — QUETIAPINE FUMARATE 50 MG PO TABS: 25.0000 mg | ORAL_TABLET | Freq: Every day | ORAL | Status: DC | PRN

## 2022-06-17 NOTE — Plan of Care (Signed)

## 2022-06-17 NOTE — TOC Initial Note (Addendum)
Transition of Care [facility]) - Initial/Assessment Note    Patient Details  Name: Ariana [name] MRN: [id number] Date of Birth: 10-18-1929  Transition of Care [facility]) CM/SW Contact:    Mearl Latin, LCSW Phone Number: 06/17/2022, 9:27 AM  Clinical Narrative:                 9:27am-CSW received consult for possible SNF placement at time of discharge. CSW spoke with patient's daughter, Florine. She expressed understanding of PT recommendation and is agreeable to SNF placement at time of discharge as she is physically unable to help the patient and her caregivers are only there M-F. She reports preference for [facility] where patient has been before. CSW discussed insurance authorization process and will provide Medicare SNF ratings list. CSW will send out referrals for review and provide bed offers as available.   CS[facility] with [facility] but unfortunately they do not have beds available. Will continue search and confirm hospital status as currently in observation.  12:27: CSW made patient's daughter aware of inpatient status change and made her aware that [facility] does not have a bed. CSW will provide bed offers as available.   Skilled Nursing Rehab Facilities-   [email or web address]   Ratings out of 5 stars (5 the highest)   Name Address  Phone # Quality Care Staffing Health Inspection Overall  [facility] [address] [phone] 2 1 5 4   [facility] [address] [place] [phone] 4 1 3 2   [facility] [address], [place]in[place] Otto [phone] 2 1 1 1   [facility] [address] [place] [phone] 4 1 3 2   [facility]  [address], [place] [phone] 3 2 5 5   [facility] [address], [facility] [phone] 2 1 2 1   [facility] [address], [place] [phone] 4 1 2 1   [facility] [address] [place] [phone] 2 4 3 3   [address]  ([place]) [address], [place] [phone] 3 2 2 2   [facility] [address] [place], [place] [phone] 1 2 1 1   [facility] [address], [place] [phone] 3 1 1 1   [facility] [address] Liliane Shi [phone] 4 3 4 4   [facility] [address], [place] [phone] 3 4 3 3           [facility] [address], [place] [phone]      [facility], [place] [address], [place] [phone] 1 1 2 1   [facility] [address], [place] [phone] 2 2 4 4   [facility] [address] [phone] 2 1 4 3   [facility] [address] [place] [phone] 3 3 3 3           [address] (no [facility]) [address], [place] [phone] 4 4 5 5   [facility] (No [employer]) [address] [place], [place] [phone] 2 2 4 4   [facility] [address] [place] [phone] 2 1 2 1   [facility] (No [facility]) [address], [place] [phone] 5 5 5 5   [facility] [address] [facility] [phone] 2 3 5 5   [facility] [address] [place] [phone] 2 1 1 1   [place] [address] Liliane Shi [phone] 5 2 5 5   [facility]  [address], [place] [phone] 2 2 2 2   [place]  [address], [place] [phone] 4 2 1 1   [facility] [address] [place], [place] [phone] 2 2 3 3           [facility] [address] [place] [phone] 1 1 1 1   [facility] [address], Evlyn Clines  [phone] 2 3 3 3   [facility] (No Humana) [address] [place] [phone] 2 1 3 2    [facility]) [address], [place]al[place] Levan [phone] 1 1 1 1   [facility] [address] [place] Levan [phone] [facility] [place] [address] [place], [place] [phone] 2 1 1 1           [facility] [address] [place], [place]  [phone] 4 4 5 5   [facility])  [address], [place] [phone] 2 1 2 1   [facility]) [address] [place] [phone]  1 4 3   [facility] [address], [place] [phone] 3 5 4 5   [address] [place], [place] [phone] 3 2 2 2   [facility]) [address] [place] [phone] 2 1 3 2      Expected Discharge Plan: Skilled Nursing Facility Barriers to Discharge: Insurance Authorization, Continued Medical Work up, SNF Pending bed offer   Patient Goals and CMS Choice Patient states their goals for this hospitalization and ongoing recovery are:: Rehab CMS Medicare.gov Compare Post Acute Care list provided to:: Patient Represenative (must comment) Choice offered to / list presented to : Adult Children [place] ownership interest in [facility].provided to:: Adult Children    Expected Discharge Plan and Services In-house Referral: Clinical Social Work   Post Acute Care Choice: Skilled Nursing Facility Living arrangements for the past 2 months: Single Family Home                                      Prior Living Arrangements/Services Living arrangements for the past 2 months: Single Family Home Lives with:: Self Patient language and need for interpreter reviewed:: Yes Do you feel safe going back to the place where you live?: Yes      Need for Family Participation in Patient Care: Yes (Comment) Care giver support system in place?: Yes (comment) Current home services: Homehealth aide, DME (Walker, cane) Criminal Activity/Legal Involvement Pertinent to Current Situation/Hospitalization: No - Comment as needed  Activities of Daily Living Home Assistive Devices/Equipment: Cane (specify quad or straight), Walker (specify type) ADL Screening (condition at time of admission) Patient's cognitive ability adequate to safely complete daily activities?: No Is the  patient deaf or have difficulty hearing?: No Does the patient have difficulty seeing, even when wearing glasses/contacts?: No Does the patient have difficulty concentrating, remembering, or making decisions?: Yes Patient able to express need for assistance with ADLs?: Yes Does the patient have difficulty dressing or bathing?: Yes Independently performs ADLs?: Yes (appropriate for developmental age) Does the patient have difficulty walking or climbing stairs?: Yes Weakness of Legs: None Weakness of Arms/Hands: None  Permission Sought/Granted Permission sought to share information with : Facility Medical sales representative, Family Supports Permission granted to share information with : Yes, Verbal Permission Granted  Share Information with NAME: Malani  Permission granted to share info w AGENCY: SNFs  Permission granted to share info w Relationship: Daughter  Permission granted to share info w Contact Information: [phone]  Emotional Assessment Appearance:: Appears stated age Attitude/Demeanor/Rapport: Unable to Assess Affect (typically observed): Unable to Assess Orientation: : Oriented to Self   Psych Involvement: No (comment)  Admission diagnosis:  Liver mass [R16.0] Patient Active Problem List   Diagnosis Date Noted   Liver mass 06/15/2022   Polyneuropathy in other diseases classified elsewhere (HCC) 06/19/2021   Chronic diastolic heart failure (HCC) 06/19/2021   Subarachnoid hemorrhage (HCC) 04/23/2020   Diabetic neuropathy (HCC) 01/28/2019   Diabetes mellitus due to underlying condition, uncontrolled 09/04/2017   History of rectal bleeding 09/04/2017   A-fib (HCC) 04/23/2017   Diabetes mellitus type 2 in nonobese (HCC) 04/23/2017   Sjogren's disease (HCC) 04/03/2016   Primary osteoarthritis of both hands 04/03/2016   High risk medication use 04/03/2016   Senile dementia, with behavioral disturbance (HCC) 08/16/2015   Swelling 08/16/2015   Diastolic dysfunction 07/08/2015    Hypertonicity, bladder 07/08/2015   Arterial hypotension    Bradycardia 06/29/2015   Chronic lower back pain 06/29/2015   PAF (paroxysmal atrial fibrillation) (HCC) 04/18/2015   Dehydration 04/18/2015   Dementia without behavioral disturbance (HCC) 04/18/2015   TIA (transient ischemic attack) 12/17/2014   Chest pain 12/16/2014   Acute encephalopathy 12/04/2014   Fall    AKI (acute kidney injury) (HCC) 12/03/2014   History of cerebrovascular disease 09/24/2014   Falls frequently 07/28/2014   Infarction of parietal lobe (HCC)    Mild dementia (HCC)    CVA (cerebral infarction) 07/26/2014   Atrial fibrillation with RVR (HCC) 07/03/2014   Constipation 04/15/2014   Mixed stress and urge urinary incontinence 11/03/2013   Therapeutic opioid induced constipation 11/03/2013   Hemorrhoid 11/03/2013   Low back pain associated with a spinal disorder other than radiculopathy or spinal stenosis 11/03/2013   Protein-calorie malnutrition, severe (HCC) 07/22/2013   UTI (lower urinary tract infection) 07/20/2013   Prolonged QT interval 07/20/2013   Osteopenia 07/17/2013   Palpitations 06/30/2013   Hereditary and idiopathic peripheral neuropathy 05/22/2013   Abnormality of gait 12/05/2012   Headache(784.0) 09/05/2012   Multinodular thyroid 01/15/2012   Neck pain 01/15/2012   Hypercholesterolemia 07/08/2010   Tear film insufficiency 07/06/2009   SJOGREN'S SYNDROME 07/06/2009   Urge urinary incontinence 01/06/2009   COLONIC POLYPS, ADENOMATOUS, HX OF 04/15/2008   MITRAL VALVE PROLAPSE 11/05/2007   ANXIETY DEPRESSION 07/02/2007   Mononeuritis 07/02/2007   HYPERTENSION, BENIGN 07/02/2007   GERD 07/02/2007   Irritable bowel syndrome 07/02/2007   Fibromyalgia 07/02/2007   PCP:  Caesar Bookman, NP Pharmacy:   [facility] [address], [place] - [address] [place] [address] Phone: [phone] Fax: [phone]     Social  Determinants of Health (SDOH) Social History: SDOH Screenings   Food Insecurity: No Food Insecurity (06/16/2022)  Housing: Medium Risk (06/16/2022)  Transportation Needs: No Transportation Needs (06/16/2022)  Utilities: Not At Risk (06/16/2022)  Alcohol Screen: Low Risk  (03/07/2018)  Depression (PHQ2-9): Low Risk  (11/18/2020)  Financial Resource Strain: Low Risk  (11/05/2017)  Physical Activity: Inactive (11/05/2017)  Social Connections: Moderately Isolated (11/05/2017)  Stress: No Stress Concern Present (11/05/2017)  Tobacco Use: Medium Risk (06/15/2022)   SDOH Interventions:     Readmission Risk Interventions     No data to display

## 2022-06-17 NOTE — [place] FL2 (Signed)
[place] MEDICAID FL2 LEVEL OF CARE FORM     IDENTIFICATION  Patient Name: Ariana [name] Birthdate: Apr 25, 1929 Sex: female Admission Date (Current Location): 06/15/2022  [facility] and [place] Number:  Producer, television/film/video and Address:  [facility], [address] [place], [place] [address]      Provider Number: [id number]  Attending Physician Name and Address:  Elease Etienne, MD  Relative Name and Phone Number:       Current Level of Care: Hospital Recommended Level of Care: Skilled Nursing Facility Prior Approval Number:    Date Approved/Denied:   PASRR Number: [id number]  Discharge Plan: SNF    Current Diagnoses: Patient Active Problem List   Diagnosis Date Noted   Liver mass 06/15/2022   Polyneuropathy in other diseases classified elsewhere (HCC) 06/19/2021   Chronic diastolic heart failure (HCC) 06/19/2021   Subarachnoid hemorrhage (HCC) 04/23/2020   Diabetic neuropathy (HCC) 01/28/2019   Diabetes mellitus due to underlying condition, uncontrolled 09/04/2017   History of rectal bleeding 09/04/2017   A-fib (HCC) 04/23/2017   Diabetes mellitus type 2 in nonobese (HCC) 04/23/2017   Sjogren's disease (HCC) 04/03/2016   Primary osteoarthritis of both hands 04/03/2016   High risk medication use 04/03/2016   Senile dementia, with behavioral disturbance (HCC) 08/16/2015   Swelling 08/16/2015   Diastolic dysfunction 07/08/2015   Hypertonicity, bladder 07/08/2015   Arterial hypotension    Bradycardia 06/29/2015   Chronic lower back pain 06/29/2015   PAF (paroxysmal atrial fibrillation) (HCC) 04/18/2015   Dehydration 04/18/2015   Dementia without behavioral disturbance (HCC) 04/18/2015   TIA (transient ischemic attack) 12/17/2014   Chest pain 12/16/2014   Acute encephalopathy 12/04/2014   Fall    AKI (acute kidney injury) (HCC) 12/03/2014   History of cerebrovascular disease 09/24/2014   Falls frequently 07/28/2014   Infarction of  parietal lobe (HCC)    Mild dementia (HCC)    CVA (cerebral infarction) 07/26/2014   Atrial fibrillation with RVR (HCC) 07/03/2014   Constipation 04/15/2014   Mixed stress and urge urinary incontinence 11/03/2013   Therapeutic opioid induced constipation 11/03/2013   Hemorrhoid 11/03/2013   Low back pain associated with a spinal disorder other than radiculopathy or spinal stenosis 11/03/2013   Protein-calorie malnutrition, severe (HCC) 07/22/2013   UTI (lower urinary tract infection) 07/20/2013   Prolonged QT interval 07/20/2013   Osteopenia 07/17/2013   Palpitations 06/30/2013   Hereditary and idiopathic peripheral neuropathy 05/22/2013   Abnormality of gait 12/05/2012   Headache(784.0) 09/05/2012   Multinodular thyroid 01/15/2012   Neck pain 01/15/2012   Hypercholesterolemia 07/08/2010   Tear film insufficiency 07/06/2009   SJOGREN'S SYNDROME 07/06/2009   Urge urinary incontinence 01/06/2009   COLONIC POLYPS, ADENOMATOUS, HX OF 04/15/2008   MITRAL VALVE PROLAPSE 11/05/2007   ANXIETY DEPRESSION 07/02/2007   Mononeuritis 07/02/2007   HYPERTENSION, BENIGN 07/02/2007   GERD 07/02/2007   Irritable bowel syndrome 07/02/2007   Fibromyalgia 07/02/2007    Orientation RESPIRATION BLADDER Height & Weight     Self  Normal Continent Weight: 138 lb (62.6 kg) Height:  5\' 3"  (160 cm)  BEHAVIORAL SYMPTOMS/MOOD NEUROLOGICAL BOWEL NUTRITION STATUS      Continent Diet (See DC Summary)  AMBULATORY STATUS COMMUNICATION OF NEEDS Skin   Limited Assist Verbally Normal                       Personal Care Assistance Level of Assistance  Bathing, Feeding, Dressing Bathing Assistance: Limited assistance  Feeding assistance: Limited assistance Dressing Assistance: Limited assistance     Functional Limitations Info             SPECIAL CARE FACTORS FREQUENCY  PT (By licensed PT), OT (By licensed OT)     PT Frequency: 5x/week OT Frequency: 5x/week            Contractures  Contractures Info: Not present    Additional Factors Info  Code Status, Allergies, Psychotropic Code Status Info: DNR Allergies Info: Banana, Codeine, Klonopin (Clonazepam), Meperidine Hcl, Norflex (Orphenadrine Citrate), Oxycodone-acetaminophen, Propoxyphene Hcl, Zoloft (Sertraline Hcl), Doxycycline, Naproxen, Penicillins, Phenothiazines, Stelazine, Sulfamethoxazole-trimethoprim, Tolectin (Tolmetin Sodium), Tramadol Psychotropic Info: Cymbalta         Current Medications (06/17/2022):  This is the current hospital active medication list Current Facility-Administered Medications  Medication Dose Route Frequency Provider Last Rate Last Admin   acetaminophen (TYLENOL) tablet 1,000 mg  1,000 mg Oral BID Marcellus Scott D, MD   1,000 mg at 06/16/22 2100   acetaminophen (TYLENOL) tablet 650 mg  650 mg Oral Q6H PRN Dow Adolph N, DO   650 mg at 06/16/22 1357   ALPRAZolam (XANAX) tablet 0.25 mg  0.25 mg Oral BID PRN Elease Etienne, MD       aspirin tablet 325 mg  325 mg Oral QPM Hongalgi, Maximino Greenland, MD   325 mg at 06/16/22 1700   bisacodyl (DULCOLAX) EC tablet 5 mg  5 mg Oral Daily PRN Hongalgi, Maximino Greenland, MD       calcium-vitamin D (OSCAL WITH D) 500-5 MG-MCG per tablet 1 tablet  1 tablet Oral Daily Elease Etienne, MD   1 tablet at 06/16/22 1048   diltiazem (CARDIZEM CD) 24 hr capsule 180 mg  180 mg Oral Daily Marcellus Scott D, MD   180 mg at 06/16/22 1048   DULoxetine (CYMBALTA) DR capsule 30 mg  30 mg Oral Daily Marcellus Scott D, MD   30 mg at 06/16/22 1359   DULoxetine (CYMBALTA) DR capsule 60 mg  60 mg Oral QPM Hongalgi, Maximino Greenland, MD   60 mg at 06/16/22 1700   enoxaparin (LOVENOX) injection 40 mg  40 mg Subcutaneous Q24H Hall, Carole N, DO   40 mg at 06/16/22 1957   furosemide (LASIX) tablet 40 mg  40 mg Oral Daily Marcellus Scott D, MD   40 mg at 06/16/22 1046   HYDROmorphone (DILAUDID) injection 0.5 mg  0.5 mg Intravenous Q8H PRN Hongalgi, Maximino Greenland, MD       linaclotide (LINZESS) capsule  145 mcg  145 mcg Oral QAC breakfast Darlin Drop, DO   145 mcg at 06/17/22 0801   loratadine (CLARITIN) tablet 10 mg  10 mg Oral Daily Marcellus Scott D, MD   10 mg at 06/16/22 1045   melatonin tablet 5 mg  5 mg Oral QHS PRN Dow Adolph N, DO       memantine (NAMENDA) tablet 10 mg  10 mg Oral BID Dow Adolph N, DO   10 mg at 06/16/22 2101   metoprolol tartrate (LOPRESSOR) injection 2.5 mg  2.5 mg Intravenous Q15 min PRN Hongalgi, Maximino Greenland, MD       mirabegron ER (MYRBETRIQ) tablet 50 mg  50 mg Oral Daily Marcellus Scott D, MD   50 mg at 06/16/22 1045   oxyCODONE (Oxy IR/ROXICODONE) immediate release tablet 5 mg  5 mg Oral Q6H PRN Dow Adolph N, DO   5 mg at 06/17/22 0428   pantoprazole (PROTONIX) EC tablet 40 mg  40 mg Oral Daily Marcellus Scott D, MD   40 mg at 06/16/22 1045   pilocarpine (SALAGEN) tablet 5 mg  5 mg Oral BID Dow Adolph N, DO   5 mg at 06/16/22 2101   polyethylene glycol (MIRALAX / GLYCOLAX) packet 17 g  17 g Oral Daily Dow Adolph N, DO   17 g at 06/16/22 1043   polyvinyl alcohol (LIQUIFILM TEARS) 1.4 % ophthalmic solution 1 drop  1 drop Both Eyes QID Hongalgi, Anand D, MD   1 drop at 06/16/22 2101   potassium chloride SA (KLOR-CON M) CR tablet 20 mEq  20 mEq Oral Daily Marcellus Scott D, MD   20 mEq at 06/16/22 1047   pregabalin (LYRICA) capsule 100 mg  100 mg Oral BID Marcellus Scott D, MD   100 mg at 06/16/22 2101   prochlorperazine (COMPAZINE) injection 5 mg  5 mg Intravenous Q6H PRN Darlin Drop, DO       saccharomyces boulardii (FLORASTOR) capsule 250 mg  250 mg Oral Daily Marcellus Scott D, MD   250 mg at 06/16/22 1046   senna-docusate (Senokot-S) tablet 1 tablet  1 tablet Oral BID Dow Adolph N, DO   1 tablet at 06/16/22 2101     Discharge Medications: Please see discharge summary for a list of discharge medications.  Relevant Imaging Results:  Relevant Lab Results:   Additional Information SS#: [id number]  Mearl Latin, LCSW

## 2022-06-17 NOTE — Care Management Obs Status (Signed)
MEDICARE OBSERVATION STATUS NOTIFICATION   Patient Details  Name: Ariana [name] MRN: [id number] Date of Birth: [birth year]   Medicare Observation Status Notification Given:  Yes    Mearl Latin, LCSW 06/17/2022, 9:24 AM

## 2022-06-17 NOTE — Progress Notes (Signed)
PROGRESS NOTE   Ariana [name]  WUJ:[id number]    DOB: October 16, 1929    DOA: 06/15/2022  PCP: Caesar Bookman, NP   I have briefly reviewed patients previous medical records in [facility].  Chief Complaint  Patient presents with   Abdominal Pain    Brief Narrative:  89 year old female, lives alone but has caregivers on weekdays from 9 AM to 5 PM, has a walker and a cane which she does not use, PMH of dementia, Sjogren's syndrome, PAF not on anticoagulation, chronic anxiety/depression, GERD, prediabetes, chronic constipation, known liver mass since 2019, recurrent UTIs, presented to the ED on 06/15/2022 with complaints of worsening abdominal pain.  CT abdomen showed findings consistent with liver mass/HCC.  General surgery and oncology were consulted, they plan to review her case at multidisciplinary tumor board next week.  Family wishes to follow-up with them as outpatient after that meeting.  Hospital course complicated by multifactorial delirium.  Therapies have evaluated and family wish to pursue SNF for STR.  TOC on board.   Assessment & Plan:  Principal Problem:   Liver mass   Right liver mass, most likely hepatocellular carcinoma: The mass apparently has been present for several years and has enlarged.  No prior MRI or biopsy.  Although no history of cirrhosis, current imaging shows cirrhosis.  CT abdomen: 9.9 x 9.4 x 9.2 cm rounded shaped mass within the inferior tip of the right lobe of the liver with heterogeneous enhancement with areas of focal internal necrosis, based on appearance, mass most likely represents hepatocellular carcinoma.  MRI of liver: Cirrhotic liver with large enlarging aggressive appearing neoplasm in the inferior aspect of the right lobe of the liver which has imaging characteristics categorized as LR-5 definitive hepatocellular carcinoma.  AFP (wnl, 3.4), CA 19-9 pending.  Acute hepatitis panel negative.  General surgery and medical oncology input appreciated.   She is added to the multidisciplinary tumor board discussion for 06/21/2022.  As per general surgery consultation, family likely would not want very invasive interventions and surgery may explore less invasive options such as percutaneous ablation or Y90.  As per medical oncology consultation, the mass appears resectable, but patient does not appear to be a surgical candidate and if the patient and family desire treatment, they recommend consultation with IR to consider embolization.  Multimodality pain control with least amount of opioids as needed given history of dementia and advanced age.  Bowel regimen.  Discussed extensively with patient's daughter/healthcare power of attorney on 5/2, prefers to wait until multidisciplinary tumor board discussion on 5/8 prior to proceeding with treatment.  Will consult palliative care team but if unable to see prior to DC, this can be pursued at the cancer center, for GOC discussions.  Pain mostly controlled on infrequent doses of oral pain meds.  PAF with RVR: Not on anticoagulation PTA.  TTE in 2019 showed LVEF of 60-65%.  Repeat 2D echo 5/3: LVEF 60-65%.  Continue home dose of Cardizem CD 180 mg daily.  As needed IV metoprolol.  Patient also on aspirin 325 Mg daily, continue.  Left brief episode of A-fib with mild RVR, reverted to sinus rhythm and patient pulled off telemetry leads.  Discontinue telemetry.  TSH 2.11  Chronic constipation in the setting of large liver mass Irritable bowel syndrome-constipation Continue home Linzess.  Also on MiraLAX and Senokot-S.  CT abdomen did not comment on stool burden.   Sjogren's disease Continue home regimen/pilocarpine 5 Mg twice daily Aspiration precautions   Chronic  anxiety/depression Dementia/delirium Continue Namenda, Cymbalta 60 mg in the evening and 30 mg in the morning, Xanax as needed.  Also on Lyrica Fall precautions, aspiration precautions, delirium precautions. Overnight 5/3 with agitated delirium,  multifactorial related to dementia, very advanced age, hospitalization, pain and pain management and sleep issues.  Did receive a dose of IM Zyprexa on 5/3 night, brief bilateral soft wrist restraints and safety sitter.  No longer agitated this morning but cycle likely to repeat overnight.  Delirium precautions.  As needed Seroquel.  EKG 5/3 with QTc of 467 ms.   GERD Continue PPI.   HFpEF 60-65% from 2D echo done in 2019 Euvolemic on exam Continue prior home dose of Lasix and potassium supplements.   History of recurrent UTI UA not suggestive of UTI.  Continue home dose of Myrbetriq.   Prediabetes Last hemoglobin A1c 6.2 on 02/03/2022. Heart healthy, carb modified diet Insulin sliding scale for hyperglycemia. Januvia on hold.    Body mass index is 24.45 kg/m.   ACP Documents: None present. DVT prophylaxis: enoxaparin (LOVENOX) injection 40 mg Start: 06/15/22 2015     Code Status: DNR:  Family Communication: I had discussed in detail with patient's daughter yesterday and updated care.  Again spoke with her this morning. Disposition:  Inpatient appropriate.  Care has crossed 2 midnights.  Meets intensity due to agitated delirium last night requiring intervention as noted above.     Consultants:     Procedures:     Antimicrobials:      Subjective:  Overnight events noted and as documented above.  Slightly sleepy but easily arousable, oriented only to self.  Denies complaints.  Limited historian due to AMS.  Objective:   Vitals:   06/16/22 1532 06/16/22 1922 06/17/22 0630 06/17/22 1023  BP: 128/77 135/75 (!) 107/51 (!) 125/55  Pulse: 79 (!) 41 (!) 51 61  Resp: 16 16 16 18   Temp: (!) 97.4 F (36.3 C) 98 F (36.7 C) 97.6 F (36.4 C)   TempSrc: Oral  Oral   SpO2: 96% 99% 94% 96%  Weight:      Height:        General exam: Elderly female, moderately built, frail and thinly nourished, lying comfortably propped up in bed without distress.  Oral mucosa  moist. Respiratory system: Clear to auscultation. Respiratory effort normal. Cardiovascular system: S1 & S2 heard, RRR.  No JVD or murmurs.  Telemetry personally reviewed: A-fib with RVR converted to sinus rhythm on 5/3 at approximately 2 PM and remained in sinus rhythm until about 6 PM when she took off the telemetry leads.  Discontinued telemetry order. Gastrointestinal system: Abdomen is nondistended, soft.  Nondiscrete right upper quadrant mildly tender mass, globular, nonpulsatile.  No other masses or organomegaly appreciated.  Normal bowel sounds heard. Central nervous system: Alert and oriented x 1. No focal neurological deficits. Extremities: Symmetric 5 x 5 power. Skin: No rashes, lesions or ulcers Psychiatry: Judgement and insight impaired. Mood & affect appropriate.     Data Reviewed:   I have personally reviewed following labs and imaging studies   CBC: Recent Labs  Lab 06/15/22 1314 06/16/22 0429  WBC 5.5 6.1  HGB 12.0 11.7*  HCT 37.1 35.5*  MCV 95.4 94.2  PLT 216 193    Basic Metabolic Panel: Recent Labs  Lab 06/15/22 1314 06/16/22 0429  NA 133* 137  K 3.9 3.5  CL 100 103  CO2 25 27  GLUCOSE 120* 94  BUN 19 15  CREATININE 0.79 0.70  CALCIUM 9.8 9.5  MG  --  2.3  PHOS  --  3.5    Liver Function Tests: Recent Labs  Lab 06/15/22 1314 06/16/22 0429  AST 41 38  ALT 52* 50*  ALKPHOS 126 120  BILITOT 0.7 0.5  PROT 7.4 6.5  ALBUMIN 3.9 3.5    CBG: No results for input(s): "GLUCAP" in the last 168 hours.  Microbiology Studies:  No results found for this or any previous visit (from the past 240 hour(s)).  Radiology Studies:  ECHOCARDIOGRAM COMPLETE  Result Date: 06/16/2022    ECHOCARDIOGRAM REPORT   Patient Name:   NINO KIRSCHNER Date of Exam: 06/16/2022 Medical Rec #:  [id number]      Height:       63.0 in Accession #:    [id number]     Weight:       138.0 lb Date of Birth:  1929/06/19      BSA:          1.652 m Patient Age:    89 years       BP:            145/70 mmHg Patient Gender: F              HR:           108 bpm. Exam Location:  Inpatient Procedure: 2D Echo Indications:    atrial fibrillation  History:        Patient has prior history of Echocardiogram examinations, most                 recent 04/25/2017. Arrythmias:Atrial Fibrillation; Risk                 Factors:Dyslipidemia.  Sonographer:    Delcie Roch RDCS Referring Phys: [id number] Reinhold Rickey D Olney Monier IMPRESSIONS  1. Left ventricular ejection fraction, by estimation, is 60 to 65%. The left ventricle has normal function. The left ventricle has no regional wall motion abnormalities. There is moderate concentric left ventricular hypertrophy. Left ventricular diastolic function could not be evaluated.  2. Right ventricular systolic function is normal. The right ventricular size is normal. Tricuspid regurgitation signal is inadequate for assessing PA pressure.  3. The mitral valve is normal in structure. Trivial mitral valve regurgitation. No evidence of mitral stenosis.  4. The aortic valve is tricuspid. Aortic valve regurgitation is not visualized. No aortic stenosis is present.  5. The inferior vena cava is normal in size with greater than 50% respiratory variability, suggesting right atrial pressure of 3 mmHg. FINDINGS  Left Ventricle: Left ventricular ejection fraction, by estimation, is 60 to 65%. The left ventricle has normal function. The left ventricle has no regional wall motion abnormalities. The left ventricular internal cavity size was normal in size. There is  moderate concentric left ventricular hypertrophy. Left ventricular diastolic function could not be evaluated due to atrial fibrillation. Left ventricular diastolic function could not be evaluated. Right Ventricle: The right ventricular size is normal. Right ventricular systolic function is normal. Tricuspid regurgitation signal is inadequate for assessing PA pressure. The tricuspid regurgitant velocity is 2.24 m/s, and with an assumed  right atrial  pressure of 3 mmHg, the estimated right ventricular systolic pressure is 23.1 mmHg. Left Atrium: Left atrial size was normal in size. Right Atrium: Right atrial size was normal in size. Pericardium: Trivial pericardial effusion is present. Mitral Valve: The mitral valve is normal in structure. Mild mitral annular calcification. Trivial mitral valve regurgitation. No evidence of mitral valve  stenosis. Tricuspid Valve: The tricuspid valve is normal in structure. Tricuspid valve regurgitation is mild . No evidence of tricuspid stenosis. Aortic Valve: The aortic valve is tricuspid. Aortic valve regurgitation is not visualized. No aortic stenosis is present. Pulmonic Valve: The pulmonic valve was normal in structure. Pulmonic valve regurgitation is not visualized. No evidence of pulmonic stenosis. Aorta: The aortic root is normal in size and structure. Venous: The inferior vena cava is normal in size with greater than 50% respiratory variability, suggesting right atrial pressure of 3 mmHg. IAS/Shunts: No atrial level shunt detected by color flow Doppler.  LEFT VENTRICLE PLAX 2D LVIDd:         3.90 cm   Diastology LVIDs:         2.60 cm   LV e' medial: 7.18 cm/s LV PW:         1.00 cm LV IVS:        1.00 cm LVOT diam:     2.10 cm LV SV:         30 LV SV Index:   18 LVOT Area:     3.46 cm  RIGHT VENTRICLE             IVC RV Basal diam:  2.30 cm     IVC diam: 1.40 cm RV S prime:     12.30 cm/s TAPSE (M-mode): 1.7 cm LEFT ATRIUM             Index        RIGHT ATRIUM           Index LA diam:        3.70 cm 2.24 cm/m   RA Area:     10.40 cm LA Vol (A2C):   48.7 ml 29.49 ml/m  RA Volume:   23.10 ml  13.99 ml/m LA Vol (A4C):   39.4 ml 23.86 ml/m LA Biplane Vol: 43.9 ml 26.58 ml/m  AORTIC VALVE LVOT Vmax:   63.60 cm/s LVOT Vmean:  39.900 cm/s LVOT VTI:    0.087 m  AORTA Ao Root diam: 3.20 cm Ao Asc diam:  3.10 cm TRICUSPID VALVE TR Peak grad:   20.1 mmHg TR Vmax:        224.00 cm/s  SHUNTS Systemic VTI:  0.09  m Systemic Diam: 2.10 cm Olga Millers MD Electronically signed by Olga Millers MD Signature Date/Time: 06/16/2022/2:10:47 PM    Final    MR LIVER W WO CONTRAST  Result Date: 06/16/2022 CLINICAL DATA:  89 year old female with suspected hepatocellular carcinoma. EXAM: MRI ABDOMEN WITHOUT AND WITH CONTRAST TECHNIQUE: Multiplanar multisequence MR imaging of the abdomen was performed both before and after the administration of intravenous contrast. CONTRAST:  6mL GADAVIST GADOBUTROL 1 MMOL/ML IV SOLN COMPARISON:  Abdominal MRI 03/31/2019. CT of the abdomen and pelvis 06/15/2022. FINDINGS: Lower chest: Unremarkable. Hepatobiliary: Liver has a slightly shrunken appearance and nodular contour, suggesting underlying cirrhosis. Again noted is a large mass in the inferior aspect of the right lobe of the liver involving portions of segments 5 and 6 (axial image 118 of series 902 and coronal image 64 of series 10) estimated to measure 9.6 x 9.6 x 9.4 cm. This lesion is heterogeneous in signal intensity on T1 and T2 weighted images, and demonstrates heterogeneous enhancement on post gadolinium images with some areas that are hypervascular and other areas that appear hypovascular during arterial phase imaging. There does appear to be a persistent area of peripheral enhancement on delayed post gadolinium imaging. Small internal areas  of T1 hyperintensity are noted on precontrast imaging (axial image 133 of series 900), indicative of some internal areas of hemorrhage. No other new hepatic lesions are noted. No intrahepatic biliary ductal dilatation. Common bile duct is mildly dilated measuring 9 mm in the porta hepatis, which is within normal limits given the patient's age. No filling defects in the common bile duct to suggest choledocholithiasis. Status post cholecystectomy. Pancreas: No pancreatic mass. No pancreatic ductal dilatation. No pancreatic or peripancreatic fluid collections or inflammatory changes. Spleen:   Unremarkable. Adrenals/Urinary Tract: Right kidney and bilateral adrenal glands are normal in appearance. Multiple small lesions are noted throughout the left kidney ranging from T1 hypointense and T2 hyperintense with no enhancement, to T1 hyperintense and T2 hypointense with no enhancement, compatible with a combination of Bosniak class 1 and Bosniak class 2 cysts (benign requiring no future imaging follow-up). No hydroureteronephrosis in the visualized portions of the abdomen. Stomach/Bowel: Visualized portions are unremarkable. Vascular/Lymphatic: No aneurysm identified in the visualized abdominal vasculature. No lymphadenopathy noted in the abdomen. Other: No significant volume of ascites noted in the visualized portions of the peritoneal cavity. Musculoskeletal: No aggressive appearing osseous lesions are noted in the visualized portions of the skeleton. Cavernous hemangioma in the L3 vertebral body incidentally noted. IMPRESSION: 1. Cirrhotic liver with large enlarging aggressive appearing neoplasm in the inferior aspect of the right lobe of the liver which has imaging characteristics (size, enhancing capsule and threshold growth) categorized as LR-5 definitive hepatocellular carcinoma. Correlation with pending AFP levels is recommended. 2. Additional incidental findings, as above. Electronically Signed   By: Trudie Reed M.D.   On: 06/16/2022 08:55   CT ABDOMEN PELVIS W CONTRAST  Result Date: 06/15/2022 CLINICAL DATA:  Right lower quadrant abdominal pain. Known right inferior hepatic mass. EXAM: CT ABDOMEN AND PELVIS WITH CONTRAST TECHNIQUE: Multidetector CT imaging of the abdomen and pelvis was performed using the standard protocol following bolus administration of intravenous contrast. RADIATION DOSE REDUCTION: This exam was performed according to the departmental dose-optimization program which includes automated exposure control, adjustment of the mA and/or kV according to patient size and/or use  of iterative reconstruction technique. CONTRAST:  65mL OMNIPAQUE IOHEXOL 350 MG/ML SOLN COMPARISON:  02/14/2021 FINDINGS: Lower chest: No acute abnormality. Hepatobiliary: Enlargement rounded shape mass within the inferior tip of the right lobe of the liver causing increased capsular distension and now measuring roughly 9.9 x 9.4 x 9.2 cm. The mass demonstrates heterogeneous enhancement with areas of focal internal necrosis. Due to the mass, the inferior extent of the right lobe of the liver extends into the upper right pelvis. No evidence by CT of spontaneous rupture or bleed of the mass. The rest of the liver is unremarkable without evidence of biliary dilatation. The gallbladder has been removed. Pancreas: Stable atrophic pancreas. Spleen: Normal in size without focal abnormality. Adrenals/Urinary Tract: Adrenal glands are unremarkable. No hydronephrosis. No calculi or solid renal masses. Bladder is unremarkable. Stomach/Bowel: Bowel shows no evidence of obstruction, ileus, inflammation or lesion. The appendix is absent. No free intraperitoneal air. Stable diverticulosis of the sigmoid colon without evidence of diverticulitis. Vascular/Lymphatic: No significant vascular findings are present. No enlarged abdominal or pelvic lymph nodes. Reproductive: Status post hysterectomy. No adnexal masses. Other: Stable small left inguinal hernia containing fat. No abscess or ascites. Musculoskeletal: Stable degenerative disc disease at L4-5 and L5-S1. Osteopenia without lumbar or lower thoracic compression fractures. IMPRESSION: 1. Enlargement of rounded shape mass within the inferior tip of the right lobe of the liver  causing increased capsular distension and now measuring roughly 9.9 x 9.4 x 9.2 cm. The mass demonstrates heterogeneous enhancement with areas of focal internal necrosis. No evidence by CT of spontaneous rupture or bleed of the mass. The increased capsular distension of the liver is likely the cause of right  lower abdominal pain. Based on appearance, this mass most likely represents a hepatocellular carcinoma. 2. Stable diverticulosis of the sigmoid colon without evidence of diverticulitis. 3. Stable small left inguinal hernia containing fat. 4. Osteopenia and degenerative disc disease at L4-5 and L5-S1. Electronically Signed   By: Irish Lack M.D.   On: 06/15/2022 17:22    Scheduled Meds:    acetaminophen  1,000 mg Oral BID   aspirin  325 mg Oral QPM   calcium-vitamin D  1 tablet Oral Daily   diltiazem  180 mg Oral Daily   DULoxetine  30 mg Oral Daily   DULoxetine  60 mg Oral QPM   enoxaparin (LOVENOX) injection  40 mg Subcutaneous Q24H   furosemide  40 mg Oral Daily   linaclotide  145 mcg Oral QAC breakfast   loratadine  10 mg Oral Daily   memantine  10 mg Oral BID   mirabegron ER  50 mg Oral Daily   pantoprazole  40 mg Oral Daily   pilocarpine  5 mg Oral BID   polyethylene glycol  17 g Oral Daily   polyvinyl alcohol  1 drop Both Eyes QID   potassium chloride SA  20 mEq Oral Daily   pregabalin  100 mg Oral BID   saccharomyces boulardii  250 mg Oral Daily   senna-docusate  1 tablet Oral BID    Continuous Infusions:       LOS: 1 day     Marcellus Scott, MD,  FACP, FHM, SFHM, [facility], [facility]   [facility] [place]     To contact the attending provider between 7A-7P or the covering provider during after hours 7P-7A, please log into the web site [email or web address] and access using universal [place] password for that web site. If you do not have the password, please call the hospital operator.  06/17/2022, 10:47 AM

## 2022-06-17 NOTE — Evaluation (Signed)
Occupational Therapy Evaluation Patient Details Name: Ariana [name] MRN: [id number] DOB: 1929/09/19 Today's Date: 06/17/2022   History of Present Illness Patient is 89 y.o. female admitted 06/15/22 with complaints of worsening abdominal pain. CT abdomen showed findings consistent with liver mass/HCC. PMH of dementia, Sjogren's syndrome, PAF not on anticoagulation, anxiety/depression, GERD, prediabetes, constipation, known liver mass since 2019, recurrent UTIs   Clinical Impression   Pt typically supervision for ADL and mobility with DME. Today she is min A for transfers and mobility with RW. Encouraged use, max A for LB dressing and peri care in standing. Min A to set up for UB ADL at this time. Pt demonstrating deficits in balance, activity tolerance, safety awareness. Will benefit from skilled OT in the acute setting as well as afterwards at the [facility] level - not only for patient but to work with caregiver in bathing specifically in home setting (may need shower chair -If she will be open to showering. Daughter says she only wants to take a bath and they struggle to get her out of the tub). Next session focus on standing activity OOB and encourage RW safety.      Recommendations for follow up therapy are one component of a multi-disciplinary discharge planning process, led by the attending physician.  Recommendations may be updated based on patient status, additional functional criteria and insurance authorization.   Assistance Recommended at Discharge Frequent or constant Supervision/Assistance  Patient can return home with the following A little help with walking and/or transfers;A lot of help with bathing/dressing/bathroom;Help with stairs or ramp for entrance;Assist for transportation    Functional Status Assessment  Patient has had a recent decline in their functional status and demonstrates the ability to make significant improvements in function in a reasonable and predictable amount of  time.  Equipment Recommendations  None recommended by OT (Pt has appropriate DME)    Recommendations for Other Services PT consult     Precautions / Restrictions Precautions Precautions: Fall Precaution Comments: frequent falls at home Restrictions Weight Bearing Restrictions: No      Mobility Bed Mobility Overal bed mobility: Needs Assistance Bed Mobility: Supine to Sit     Supine to sit: Supervision     General bed mobility comments: no phyiscal assist needed    Transfers Overall transfer level: Needs assistance Equipment used: 1 person hand held assist, Rolling walker (2 wheels) Transfers: Sit to/from Stand, Bed to chair/wheelchair/BSC Sit to Stand: Min assist     Step pivot transfers: Min assist     General transfer comment: min assist to power up from EOB, pt using bil UE's to initiate. HHA provided for steadying in standing. HHA to guide balance with small steps bed>BSC.      Balance Overall balance assessment: Needs assistance Sitting-balance support: Feet supported Sitting balance-Leahy Scale: Good     Standing balance support: Bilateral upper extremity supported, Single extremity supported, During functional activity, Reliant on assistive device for balance Standing balance-Leahy Scale: Poor Standing balance comment: pt reaching for support with mobility, reliant on therapis, encouraged use of RW for mobility - pt verbalized understanding and that it was more steady                           ADL either performed or assessed with clinical judgement   ADL Overall ADL's : Needs assistance/impaired Eating/Feeding: Independent   Grooming: Wash/dry hands;Min guard;Standing Grooming Details (indicate cue type and reason): sink level Upper Body Bathing: Minimal  assistance Upper Body Bathing Details (indicate cue type and reason): sponge bathes at baseline. Daughter reports that she would like to get in bath, but caregivers struggle to get her  out, they also struggle with hair washing Lower Body Bathing: Minimal assistance;Sitting/lateral leans;With caregiver independent assisting   Upper Body Dressing : Set up   Lower Body Dressing: Maximal assistance;Sitting/lateral leans Lower Body Dressing Details (indicate cue type and reason): donning shoes and mesh underwear Toilet Transfer: Minimal assistance;Rolling walker (2 wheels) Toilet Transfer Details (indicate cue type and reason): encouraged and educated on use of RW Toileting- Architect and Hygiene: Maximal assistance Toileting - Clothing Manipulation Details (indicate cue type and reason): Pt turns around and bends over for OT to perform front and rear peri care     Functional mobility during ADLs: Minimal assistance;Rolling walker (2 wheels) General ADL Comments: Pt REALLY enjoyed 50's music     Vision Baseline Vision/History: 0 No visual deficits Ability to See in Adequate Light: 0 Adequate Patient Visual Report: No change from baseline Additional Comments: hx of cataract sx, loves to read     Perception     Praxis      Pertinent Vitals/Pain Pain Assessment Pain Assessment: No/denies pain     Hand Dominance Right   Extremity/Trunk Assessment Upper Extremity Assessment Upper Extremity Assessment: Generalized weakness   Lower Extremity Assessment Lower Extremity Assessment: Defer to PT evaluation   Cervical / Trunk Assessment Cervical / Trunk Assessment: Kyphotic   Communication Communication Communication: HOH   Cognition Arousal/Alertness: Awake/alert Behavior During Therapy: WFL for tasks assessed/performed Overall Cognitive Status: History of cognitive impairments - at baseline                                       General Comments  spoke with daughter Ashana prior to session to confirm home set up and PLOF. reports that stove is off, pt is now struggling with operating microwave and they suspect that she sneaks into the  garage at night to go through boxes (they are attempting to de-clutter her home). Pt really enjoyed music during session, dancing her way down the hallway and tapping her toes in the chair    Exercises     Shoulder Instructions      Home Living Family/patient expects to be discharged to:: Private residence Living Arrangements: Alone Available Help at Discharge: Family;Available 24 hours/day Type of Home: House Home Access: Level entry     Home Layout: One level     Bathroom Shower/Tub: Chief Strategy Officer: Standard Bathroom Accessibility: Yes How Accessible: Accessible via walker Home Equipment: Rolling Walker (2 wheels);Rollator (4 wheels);Cane - quad;Toilet riser   Additional Comments: HHAaide 9-5, son-in-law bring food and check in on weekends, daughter suspects that she is up all night and goes into the garage      Prior Functioning/Environment Prior Level of Function : Needs assist (confirmed with daughter)             Mobility Comments: has DME and does not use per daughter ADLs Comments: family and aide manages meds, sponge bathes at baseline (would get into bath...but can't get out)        OT Problem List: Impaired balance (sitting and/or standing);Decreased safety awareness;Decreased knowledge of use of DME or AE      OT Treatment/Interventions: Self-care/ADL training;DME and/or AE instruction;Therapeutic activities;Cognitive remediation/compensation;Patient/family education;Balance training    OT  Goals(Current goals can be found in the care plan section) Acute Rehab OT Goals Patient Stated Goal: get back to house OT Goal Formulation: With patient Time For Goal Achievement: 07/01/22 Potential to Achieve Goals: Good  OT Frequency: Min 2X/week    Co-evaluation              AM-PAC OT "6 Clicks" Daily Activity     Outcome Measure Help from another person eating meals?: None Help from another person taking care of personal grooming?: A  Little Help from another person toileting, which includes using toliet, bedpan, or urinal?: A Lot Help from another person bathing (including washing, rinsing, drying)?: A Lot Help from another person to put on and taking off regular upper body clothing?: A Little Help from another person to put on and taking off regular lower body clothing?: A Lot 6 Click Score: 16   End of Session Equipment Utilized During Treatment: Gait belt;Rolling walker (2 wheels) Nurse Communication: Mobility status;Precautions  Activity Tolerance: Patient tolerated treatment well Patient left: in chair;with call bell/phone within reach;with chair alarm set;Other (comment) (music playing)  OT Visit Diagnosis: Repeated falls (R29.6);Other symptoms and signs involving cognitive function                Time: 1015-1047 OT Time Calculation (min): 32 min Charges:  OT General Charges $OT Visit: 1 Visit OT Evaluation $OT Eval Moderate Complexity: 1 Mod OT Treatments $Self Care/Home Management : 8-22 mins  Nyoka Cowden OTR/L Acute Rehabilitation Services Office: [phone]  [facility] 06/17/2022, 11:22 AM

## 2022-06-18 DIAGNOSIS — R410 Disorientation, unspecified: Secondary | ICD-10-CM | POA: Diagnosis not present

## 2022-06-18 DIAGNOSIS — Z7189 Other specified counseling: Secondary | ICD-10-CM

## 2022-06-18 DIAGNOSIS — C220 Liver cell carcinoma: Secondary | ICD-10-CM | POA: Diagnosis not present

## 2022-06-18 DIAGNOSIS — I480 Paroxysmal atrial fibrillation: Secondary | ICD-10-CM | POA: Diagnosis not present

## 2022-06-18 DIAGNOSIS — R160 Hepatomegaly, not elsewhere classified: Secondary | ICD-10-CM | POA: Diagnosis not present

## 2022-06-18 DIAGNOSIS — Z515 Encounter for palliative care: Secondary | ICD-10-CM

## 2022-06-18 NOTE — Progress Notes (Signed)
PROGRESS NOTE   Ariana [name]  ZOX:[id number]    DOB: 12/24/1929    DOA: 06/15/2022  PCP: Caesar Bookman, NP   I have briefly reviewed patients previous medical records in [facility].  Chief Complaint  Patient presents with   Abdominal Pain    Brief Narrative:  89 year old female, lives alone but has caregivers on weekdays from 9 AM to 5 PM, has a walker and a cane which she does not use, PMH of dementia, Sjogren's syndrome, PAF not on anticoagulation, chronic anxiety/depression, GERD, prediabetes, chronic constipation, known liver mass since 2019, recurrent UTIs, presented to the ED on 06/15/2022 with complaints of worsening abdominal pain.  CT abdomen showed findings consistent with liver mass/HCC.  General surgery and oncology were consulted, they plan to review her case at multidisciplinary tumor board next week.  Family wishes to follow-up with them as outpatient after that meeting.  Hospital course complicated by multifactorial delirium.  Therapies have evaluated and family wish to pursue SNF for STR.  TOC on board.   Assessment & Plan:  Principal Problem:   Liver mass   Right liver mass, most likely hepatocellular carcinoma: The mass apparently has been present for several years and has enlarged.  No prior MRI or biopsy.  Although no history of cirrhosis, current imaging shows cirrhosis.  CT abdomen: 9.9 x 9.4 x 9.2 cm rounded shaped mass within the inferior tip of the right lobe of the liver with heterogeneous enhancement with areas of focal internal necrosis, based on appearance, mass most likely represents hepatocellular carcinoma.  MRI of liver: Cirrhotic liver with large enlarging aggressive appearing neoplasm in the inferior aspect of the right lobe of the liver which has imaging characteristics categorized as LR-5 definitive hepatocellular carcinoma.  AFP (wnl, 3.4), CA 19-9 pending.  Acute hepatitis panel negative.  General surgery and medical oncology input appreciated.   She is added to the multidisciplinary tumor board discussion for 06/21/2022.  As per general surgery consultation, family likely would not want very invasive interventions and surgery may explore less invasive options such as percutaneous ablation or Y90.  As per medical oncology consultation, the mass appears resectable, but patient does not appear to be a surgical candidate and if the patient and family desire treatment, they recommend consultation with IR to consider embolization.  Multimodality pain control with least amount of opioids as needed given history of dementia and advanced age.  Bowel regimen.  Discussed extensively with patient's daughter/healthcare power of attorney on 5/2, prefers to wait until multidisciplinary tumor board discussion on 5/8 prior to proceeding with treatment.  Will consult palliative care team but if unable to see prior to DC, this can be pursued at the cancer center, for GOC discussions.  Pain mostly controlled on infrequent doses of oral pain meds (has received a single dose of oxycodone daily for the last 2 days).  PAF with RVR: Not on anticoagulation PTA.  TTE in 2019 showed LVEF of 60-65%.  Repeat 2D echo 5/3: LVEF 60-65%.  Continue home dose of Cardizem CD 180 mg daily.  As needed IV metoprolol.  Patient also on aspirin 325 Mg daily, continue.  Left brief episode of A-fib with mild RVR, reverted to sinus rhythm and patient pulled off telemetry leads.  Discontinue telemetry.  TSH 2.11  Chronic constipation in the setting of large liver mass Irritable bowel syndrome-constipation Continue home Linzess.  Also on MiraLAX and Senokot-S.  CT abdomen did not comment on stool burden.  Trial of  Dulcolax today.   Sjogren's disease Continue home regimen/pilocarpine 5 Mg twice daily Aspiration precautions   Chronic anxiety/depression Dementia/delirium Continue Namenda, Cymbalta 60 mg in the evening and 30 mg in the morning, Xanax as needed.  Also on Lyrica Fall precautions,  aspiration precautions, delirium precautions. Overnight 5/3 with agitated delirium, multifactorial related to dementia, very advanced age, hospitalization, pain and pain management and sleep issues.  Did receive a dose of IM Zyprexa on 5/3 night, brief bilateral soft wrist restraints and safety sitter. Delirium precautions.  As needed Seroquel.  EKG 5/3 with QTc of 467 ms.  Improved.  Did not have to use Seroquel last night.  No agitation per discussion with night RN.  Discontinued Recruitment consultant, do not see needed at this time and moreover she has to be off sitter for 24 hours for admission to SNF.   GERD Continue PPI.   HFpEF 60-65% from 2D echo done in 2019 Euvolemic on exam Continue prior home dose of Lasix and potassium supplements.   History of recurrent UTI UA not suggestive of UTI.  Continue home dose of Myrbetriq.   Prediabetes Last hemoglobin A1c 6.2 on 02/03/2022. Heart healthy, carb modified diet Insulin sliding scale for hyperglycemia. Januvia on hold.    Body mass index is 24.45 kg/m.   ACP Documents: None present. DVT prophylaxis: enoxaparin (LOVENOX) injection 40 mg Start: 06/15/22 2015     Code Status: DNR:  Family Communication: None at bedside today. Disposition:  Possible discharge to SNF 5/6.     Consultants:     Procedures:     Antimicrobials:      Subjective:  Awake and alert.  Sitting up in bed, open and drank milk from the carton by herself.  Fixing to eat breakfast by herself.  Alert and oriented to person, place and is aware that she is in the hospital due to abdominal pain.  Per discussion with night RN, had a safety sitter last night but no agitation and did not have to give Seroquel.  Objective:   Vitals:   06/16/22 1922 06/17/22 0630 06/17/22 1023 06/18/22 0626  BP: 135/75 (!) 107/51 (!) 125/55 129/72  Pulse: (!) 41 (!) 51 61 63  Resp: 16 16 18 18   Temp: 98 F (36.7 C) 97.6 F (36.4 C)  98.3 F (36.8 C)  TempSrc:  Oral  Oral   SpO2: 99% 94% 96% 97%  Weight:      Height:        General exam: Elderly female, moderately built, frail and thinly nourished, sitting up comfortably in bed.  Oral mucosa moist. Respiratory system: Clear to auscultation.  No increased work of breathing. Cardiovascular system: S1 and S2 heard, RRR.  No JVD, murmurs or pedal edema.  Off telemetry. Gastrointestinal system: Abdomen is nondistended, soft.  Nondiscrete right upper quadrant mildly tender mass, globular, nonpulsatile.  No other masses or organomegaly appreciated.  Normal bowel sounds heard. Central nervous system: Alert and oriented x 2. No focal neurological deficits.  Much more pleasant, coherent, answers questions appropriately. Extremities: Symmetric 5 x 5 power. Skin: No rashes, lesions or ulcers Psychiatry: Judgement and insight impaired. Mood & affect appropriate.     Data Reviewed:   I have personally reviewed following labs and imaging studies   CBC: Recent Labs  Lab 06/15/22 1314 06/16/22 0429  WBC 5.5 6.1  HGB 12.0 11.7*  HCT 37.1 35.5*  MCV 95.4 94.2  PLT 216 193    Basic Metabolic Panel: Recent Labs  Lab  06/15/22 1314 06/16/22 0429  NA 133* 137  K 3.9 3.5  CL 100 103  CO2 25 27  GLUCOSE 120* 94  BUN 19 15  CREATININE 0.79 0.70  CALCIUM 9.8 9.5  MG  --  2.3  PHOS  --  3.5    Liver Function Tests: Recent Labs  Lab 06/15/22 1314 06/16/22 0429  AST 41 38  ALT 52* 50*  ALKPHOS 126 120  BILITOT 0.7 0.5  PROT 7.4 6.5  ALBUMIN 3.9 3.5    CBG: No results for input(s): "GLUCAP" in the last 168 hours.  Microbiology Studies:  No results found for this or any previous visit (from the past 240 hour(s)).  Radiology Studies:  ECHOCARDIOGRAM COMPLETE  Result Date: 06/16/2022    ECHOCARDIOGRAM REPORT   Patient Name:   Ariana [name] Date of Exam: 06/16/2022 Medical Rec #:  [id number]      Height:       63.0 in Accession #:    [id number]     Weight:       138.0 lb Date of Birth:  06-Mar-1929       BSA:          1.652 m Patient Age:    89 years       BP:           145/70 mmHg Patient Gender: F              HR:           108 bpm. Exam Location:  Inpatient Procedure: 2D Echo Indications:    atrial fibrillation  History:        Patient has prior history of Echocardiogram examinations, most                 recent 04/25/2017. Arrythmias:Atrial Fibrillation; Risk                 Factors:Dyslipidemia.  Sonographer:    Delcie Roch RDCS Referring Phys: [id number] Lorilee Cafarella D Talin Feister IMPRESSIONS  1. Left ventricular ejection fraction, by estimation, is 60 to 65%. The left ventricle has normal function. The left ventricle has no regional wall motion abnormalities. There is moderate concentric left ventricular hypertrophy. Left ventricular diastolic function could not be evaluated.  2. Right ventricular systolic function is normal. The right ventricular size is normal. Tricuspid regurgitation signal is inadequate for assessing PA pressure.  3. The mitral valve is normal in structure. Trivial mitral valve regurgitation. No evidence of mitral stenosis.  4. The aortic valve is tricuspid. Aortic valve regurgitation is not visualized. No aortic stenosis is present.  5. The inferior vena cava is normal in size with greater than 50% respiratory variability, suggesting right atrial pressure of 3 mmHg. FINDINGS  Left Ventricle: Left ventricular ejection fraction, by estimation, is 60 to 65%. The left ventricle has normal function. The left ventricle has no regional wall motion abnormalities. The left ventricular internal cavity size was normal in size. There is  moderate concentric left ventricular hypertrophy. Left ventricular diastolic function could not be evaluated due to atrial fibrillation. Left ventricular diastolic function could not be evaluated. Right Ventricle: The right ventricular size is normal. Right ventricular systolic function is normal. Tricuspid regurgitation signal is inadequate for assessing PA pressure. The  tricuspid regurgitant velocity is 2.24 m/s, and with an assumed right atrial  pressure of 3 mmHg, the estimated right ventricular systolic pressure is 23.1 mmHg. Left Atrium: Left atrial size was normal in size. Right Atrium: Right atrial  size was normal in size. Pericardium: Trivial pericardial effusion is present. Mitral Valve: The mitral valve is normal in structure. Mild mitral annular calcification. Trivial mitral valve regurgitation. No evidence of mitral valve stenosis. Tricuspid Valve: The tricuspid valve is normal in structure. Tricuspid valve regurgitation is mild . No evidence of tricuspid stenosis. Aortic Valve: The aortic valve is tricuspid. Aortic valve regurgitation is not visualized. No aortic stenosis is present. Pulmonic Valve: The pulmonic valve was normal in structure. Pulmonic valve regurgitation is not visualized. No evidence of pulmonic stenosis. Aorta: The aortic root is normal in size and structure. Venous: The inferior vena cava is normal in size with greater than 50% respiratory variability, suggesting right atrial pressure of 3 mmHg. IAS/Shunts: No atrial level shunt detected by color flow Doppler.  LEFT VENTRICLE PLAX 2D LVIDd:         3.90 cm   Diastology LVIDs:         2.60 cm   LV e' medial: 7.18 cm/s LV PW:         1.00 cm LV IVS:        1.00 cm LVOT diam:     2.10 cm LV SV:         30 LV SV Index:   18 LVOT Area:     3.46 cm  RIGHT VENTRICLE             IVC RV Basal diam:  2.30 cm     IVC diam: 1.40 cm RV S prime:     12.30 cm/s TAPSE (M-mode): 1.7 cm LEFT ATRIUM             Index        RIGHT ATRIUM           Index LA diam:        3.70 cm 2.24 cm/m   RA Area:     10.40 cm LA Vol (A2C):   48.7 ml 29.49 ml/m  RA Volume:   23.10 ml  13.99 ml/m LA Vol (A4C):   39.4 ml 23.86 ml/m LA Biplane Vol: 43.9 ml 26.58 ml/m  AORTIC VALVE LVOT Vmax:   63.60 cm/s LVOT Vmean:  39.900 cm/s LVOT VTI:    0.087 m  AORTA Ao Root diam: 3.20 cm Ao Asc diam:  3.10 cm TRICUSPID VALVE TR Peak grad:    20.1 mmHg TR Vmax:        224.00 cm/s  SHUNTS Systemic VTI:  0.09 m Systemic Diam: 2.10 cm Olga Millers MD Electronically signed by Olga Millers MD Signature Date/Time: 06/16/2022/2:10:47 PM    Final     Scheduled Meds:    acetaminophen  1,000 mg Oral BID   aspirin  325 mg Oral QPM   calcium-vitamin D  1 tablet Oral Daily   diltiazem  180 mg Oral Daily   DULoxetine  30 mg Oral Daily   DULoxetine  60 mg Oral QPM   enoxaparin (LOVENOX) injection  40 mg Subcutaneous Q24H   furosemide  40 mg Oral Daily   linaclotide  145 mcg Oral QAC breakfast   loratadine  10 mg Oral Daily   memantine  10 mg Oral BID   mirabegron ER  50 mg Oral Daily   pantoprazole  40 mg Oral Daily   pilocarpine  5 mg Oral BID   polyethylene glycol  17 g Oral Daily   polyvinyl alcohol  1 drop Both Eyes QID   potassium chloride SA  20 mEq Oral Daily  pregabalin  100 mg Oral BID   saccharomyces boulardii  250 mg Oral Daily   senna-docusate  1 tablet Oral BID    Continuous Infusions:       LOS: 2 days     Marcellus Scott, MD,  FACP, [facility], [facility], [facility], [facility]   [facility] [place]     To contact the attending provider between 7A-7P or the covering provider during after hours 7P-7A, please log into the web site [email or web address] and access using universal [place] password for that web site. If you do not have the password, please call the hospital operator.  06/18/2022, 10:22 AM

## 2022-06-18 NOTE — Plan of Care (Signed)

## 2022-06-18 NOTE — Consult Note (Signed)
Palliative Medicine Inpatient Consult Note  Consulting Provider: Elease Etienne, MD   Reason for consult:   Palliative Care Consult Services Palliative Medicine Consult  Reason for Consult? HCC new diagnosis. GOC   06/18/2022  HPI:  Per intake H&P --> 89 year old female with a PMH of dementia, Sjogren's syndrome, PAF not on anticoagulation, chronic anxiety/depression, GERD, prediabetes, chronic constipation, known liver mass since 07/10/2017, recurrent UTIs, presented to the ED on 06/15/2022 with complaints of worsening abdominal pain.  CT abdomen showed findings consistent with liver mass/HCC.  General surgery and oncology were consulted, they plan to review her case at multidisciplinary tumor board next week.  Family wishes to follow-up with them as outpatient after that meeting.  Hospital course complicated by multifactorial delirium.  Therapies have evaluated and family wish to pursue SNF for STR.  TOC on board.   Palliative care has been asked to get involved for further goals of   Clinical Assessment/Goals of Care:  *Please note that this is a verbal dictation therefore any spelling or grammatical errors are due to the "Dragon Medical One" system interpretation.  I have reviewed medical records including EPIC notes, labs and imaging, received report from bedside RN, assessed the patient who is lying in bed reviewing her breakfast order.    I called patients daughter, Ariana [name]. to further discuss diagnosis prognosis, GOC, EOL wishes, disposition and options.   I introduced Palliative Medicine as specialized medical care for people living with serious illness. It focuses on providing relief from the symptoms and stress of a serious illness. The goal is to improve quality of life for both the patient and the family.  Medical History Review and Understanding:  A review of Ariana [name]'s past medical history inclusive of dementia, paroxysmal A-fib, anxiety, depression, dementia, constipation,  recurrent UTIs, and Sjogren's syndrome.  Social History:  Ariana [name] is from [place], [place].  She is a widow as her husband passed away in 11-Jul-2002.  She had 2 children a son and a daughter though her son died in 2010/07/11 of sudden cardiac arrest.  She has 1 granddaughter.  She formally worked for [employer].  She is a woman of faith and practices within the [facility] denomination.  Functional and Nutritional State:  Preceding hospitalization Johni lived alone, but has caregivers on weekdays from 9 AM to 5 PM.  She has a walker which she tends to not use.  Both patient's daughter and son-in-law help assist with care on the weekends.  Marykate was eating fairly well preceding admission.  Palliative Symptoms:  Delirium - has been recurrent since hospitalization.  Advance Directives:  A detailed discussion was had today regarding advanced directives.  Patients daughter thinks her mother has completed these documents though is unable to retrieve them.   Code Status:  Concepts specific to code status, artifical feeding and hydration, continued IV antibiotics and rehospitalization was had.  The difference between a aggressive medical intervention path  and a palliative comfort care path for this patient at this time was had.   Patient is an established DNAR/DNI.   Discussion:  We reviewed in detail Ariana [name]'s condition in the setting of her liver mass and suspected hepatocellular carcinoma.  Patient's daughter [name] shares that in the past her mother has been told that she is not a good surgical candidate mostly due to her age.  She had an unfortunate occurrence with an orthopedic surgeon who made the comment "I would not touch you with a 10 foot pole".  And since  then there has been some suspicion that the patient is not getting the proper medical care due to ageism.  We reviewed the plan for Ariana [name]'s case to be brought to the tumor boards to better identify what treatment options would look like.   Patient's daughter would like very much to hear what the potential treatment plans would be before making a determination.  We did discuss that all interventions [place] surgery, chemotherapy, or radiation do carry with them potential risks.  We did review that the patient who is older and has multiple chronic comorbid conditions tends to be at higher risk than someone who is younger and in a more healthy state.  I was open and honest in sharing that if the oncology and surgical teams feel Ariana [name] is too high risk for additional interventions then the option of hospice is one to consider. I described hospice as a service for patients who have a life expectancy of 6 months or less. The goal of hospice is the preservation of dignity and quality at the end phases of life. Under hospice care, the focus changes from curative to symptom relief.  Ariana [name] shares understanding of hospice and at this time does not know which direction of care she would prefer to go.  We had an exceptionally long conversation in the setting of how difficult the last 13 years have been since [employer] has been her mother's primary caregiver.  She shares that her mother never particularly liked her and always played favoritism with her brother.  She vocalizes that since her brother's death her mother has been even more nasty towards her causing great strain and [employer]. emotional state.  I offered empathetic support through therapeutic listening.  The plan at this time will be for outpatient palliative follow-up when patient goes to rehab for strengthening.  Patient's daughter wants her to be sent to a better rehab than the 1 she has been accepted at and is hopeful that on Monday there can be better clarity regarding this.  Discussed the importance of continued conversation with family and their  medical providers regarding overall plan of care and treatment options, ensuring decisions are within the context of the patients  values and GOCs.  Decision Maker: Ariana, [name] (Daughter): [phone] (Mobile)   SUMMARY OF RECOMMENDATIONS   DNAR/DNI   Patients daughter would like to hear from Oncology after tumor board regarding options for possible treatment  Hospice was discussed if treatment options are limited or too trial-some  OP Palliative care referral  Ongoing PMT support  Code Status/Advance Care Planning: DNAR/DNI   Symptom Management:  Delirium: - Lights and TV off, minimize interruptions at night -Blinds open and lights on during day -Glasses/hearing aid with patient -Get up during the day -Encourage a familiar face to remain present throughout the day -Frequent reorientation  Generalized Deconditioning: - PT/OT   Palliative Prophylaxis:  Aspiration, Bowel Regimen, Delirium Protocol, Frequent Pain Assessment, Oral Care, Palliative Wound Care, and Turn Reposition  Additional Recommendations (Limitations, Scope, Preferences): Continue present care  Psycho-social/Spiritual:  Desire for further Chaplaincy support: Not at this time Additional Recommendations: Education on hepatocellular cancer   Prognosis: Limited overall would be hospice appropriate at this time if no addition treatments are pursued  Discharge Planning: Discharge once medically optimized  Vitals:   06/17/22 1023 06/18/22 0626  BP: (!) 125/55 129/72  Pulse: 61 63  Resp: 18 18  Temp:  98.3 F (36.8 C)  SpO2: 96% 97%    Intake/Output  Summary (Last 24 hours) at 06/18/2022 0932 Last data filed at 06/18/2022 0645 Gross per 24 hour  Intake --  Output 1300 ml  Net -1300 ml   Last Weight  Most recent update: 06/15/2022  1:12 PM    Weight  62.6 kg (138 lb)            Gen:  Elderly F in NAD HEENT: moist mucous membranes CV: Regular rate and rhythm  PULM: On RA, breathing is even and nonlabored ABD: soft/nontender  EXT: No edema  Neuro: Oriented to self only  PPS: 40-50%   This conversation/these  recommendations were discussed with patient primary care team, Dr. Waymon Amato  Total Time: 95 Billing based on MDM: High  Problems Addressed: One acute or chronic illness or injury that poses a threat to life or bodily function  Amount and/or Complexity of Data: Category 3:Discussion of management or test interpretation with external physician/other qualified health care professional/appropriate source (not separately reported)  Risks: Decision not to resuscitate or to de-escalate care because of poor prognosis ______________________________________________________ Lamarr Lulas [place] Palliative Medicine Team Team Cell Phone: [phone] Please utilize secure chat with additional questions, if there is no response within 30 minutes please call the above phone number  Palliative Medicine Team providers are available by phone from 7am to 7pm daily and can be reached through the team cell phone.  Should this patient require assistance outside of these hours, please call the patient's attending physician.

## 2022-06-19 DIAGNOSIS — I480 Paroxysmal atrial fibrillation: Secondary | ICD-10-CM | POA: Diagnosis not present

## 2022-06-19 DIAGNOSIS — R160 Hepatomegaly, not elsewhere classified: Secondary | ICD-10-CM | POA: Diagnosis not present

## 2022-06-19 DIAGNOSIS — G609 Hereditary and idiopathic neuropathy, unspecified: Secondary | ICD-10-CM | POA: Diagnosis not present

## 2022-06-19 DIAGNOSIS — M3500 Sicca syndrome, unspecified: Secondary | ICD-10-CM | POA: Diagnosis not present

## 2022-06-19 DIAGNOSIS — Z9181 History of falling: Secondary | ICD-10-CM | POA: Diagnosis not present

## 2022-06-19 DIAGNOSIS — R1084 Generalized abdominal pain: Secondary | ICD-10-CM | POA: Diagnosis not present

## 2022-06-19 DIAGNOSIS — R41841 Cognitive communication deficit: Secondary | ICD-10-CM | POA: Diagnosis not present

## 2022-06-19 DIAGNOSIS — R0689 Other abnormalities of breathing: Secondary | ICD-10-CM | POA: Diagnosis not present

## 2022-06-19 DIAGNOSIS — D490 Neoplasm of unspecified behavior of digestive system: Secondary | ICD-10-CM | POA: Diagnosis not present

## 2022-06-19 DIAGNOSIS — E7800 Pure hypercholesterolemia, unspecified: Secondary | ICD-10-CM | POA: Diagnosis not present

## 2022-06-19 DIAGNOSIS — K7469 Other cirrhosis of liver: Secondary | ICD-10-CM | POA: Diagnosis not present

## 2022-06-19 DIAGNOSIS — S8265XD Nondisplaced fracture of lateral malleolus of left fibula, subsequent encounter for closed fracture with routine healing: Secondary | ICD-10-CM | POA: Diagnosis not present

## 2022-06-19 DIAGNOSIS — Z515 Encounter for palliative care: Secondary | ICD-10-CM | POA: Diagnosis not present

## 2022-06-19 DIAGNOSIS — I959 Hypotension, unspecified: Secondary | ICD-10-CM | POA: Diagnosis not present

## 2022-06-19 DIAGNOSIS — I482 Chronic atrial fibrillation, unspecified: Secondary | ICD-10-CM | POA: Diagnosis not present

## 2022-06-19 DIAGNOSIS — R001 Bradycardia, unspecified: Secondary | ICD-10-CM | POA: Diagnosis not present

## 2022-06-19 DIAGNOSIS — I10 Essential (primary) hypertension: Secondary | ICD-10-CM | POA: Diagnosis not present

## 2022-06-19 DIAGNOSIS — I4891 Unspecified atrial fibrillation: Secondary | ICD-10-CM | POA: Diagnosis not present

## 2022-06-19 DIAGNOSIS — M545 Low back pain, unspecified: Secondary | ICD-10-CM | POA: Diagnosis not present

## 2022-06-19 DIAGNOSIS — R2689 Other abnormalities of gait and mobility: Secondary | ICD-10-CM | POA: Diagnosis not present

## 2022-06-19 DIAGNOSIS — Z7401 Bed confinement status: Secondary | ICD-10-CM | POA: Diagnosis not present

## 2022-06-19 DIAGNOSIS — C228 Malignant neoplasm of liver, primary, unspecified as to type: Secondary | ICD-10-CM | POA: Diagnosis not present

## 2022-06-19 DIAGNOSIS — E0865 Diabetes mellitus due to underlying condition with hyperglycemia: Secondary | ICD-10-CM | POA: Diagnosis not present

## 2022-06-19 DIAGNOSIS — R2681 Unsteadiness on feet: Secondary | ICD-10-CM | POA: Diagnosis not present

## 2022-06-19 DIAGNOSIS — M6281 Muscle weakness (generalized): Secondary | ICD-10-CM | POA: Diagnosis not present

## 2022-06-19 DIAGNOSIS — E1140 Type 2 diabetes mellitus with diabetic neuropathy, unspecified: Secondary | ICD-10-CM | POA: Diagnosis not present

## 2022-06-19 DIAGNOSIS — E871 Hypo-osmolality and hyponatremia: Secondary | ICD-10-CM | POA: Diagnosis not present

## 2022-06-19 MED ORDER — PREGABALIN 100 MG PO CAPS
100.0000 mg | ORAL_CAPSULE | Freq: Two times a day (BID) | ORAL | 0 refills | Status: DC
Start: 2022-06-19 — End: 2022-09-15

## 2022-06-19 MED ORDER — BISACODYL 10 MG RE SUPP
10.0000 mg | Freq: Once | RECTAL | Status: AC
  Administered 2022-06-19: 10 mg via RECTAL
  Filled 2022-06-19: qty 1

## 2022-06-19 MED ORDER — DULOXETINE HCL 30 MG PO CPEP
30.0000 mg | ORAL_CAPSULE | Freq: Every day | ORAL | Status: DC
Start: 2022-06-19 — End: 2022-10-03

## 2022-06-19 MED ORDER — ALPRAZOLAM 0.25 MG PO TABS: 0.2500 mg | ORAL_TABLET | Freq: Two times a day (BID) | ORAL | 0 refills | Status: DC | PRN

## 2022-06-19 MED ORDER — OXYCODONE HCL 5 MG PO TABS: 5.0000 mg | ORAL_TABLET | Freq: Two times a day (BID) | ORAL | 0 refills | Status: DC | PRN

## 2022-06-19 MED ORDER — DULOXETINE HCL 60 MG PO CPEP
60.0000 mg | ORAL_CAPSULE | Freq: Every day | ORAL | Status: DC
Start: 2022-06-19 — End: 2022-10-03

## 2022-06-19 MED ORDER — SENNOSIDES-DOCUSATE SODIUM 8.6-50 MG PO TABS: 1.0000 | ORAL_TABLET | Freq: Two times a day (BID) | ORAL | Status: DC

## 2022-06-19 MED ORDER — BISACODYL 5 MG PO TBEC: 5.0000 mg | DELAYED_RELEASE_TABLET | Freq: Every day | ORAL | Status: DC | PRN

## 2022-06-19 MED ORDER — SACCHAROMYCES BOULARDII 250 MG PO CAPS
250.0000 mg | ORAL_CAPSULE | Freq: Every day | ORAL | Status: DC
Start: 2022-06-19 — End: 2022-07-11

## 2022-06-19 MED ORDER — POLYETHYLENE GLYCOL 3350 17 G PO PACK: 17.0000 g | PACK | Freq: Every day | ORAL | Status: DC

## 2022-06-19 NOTE — Progress Notes (Signed)
   Progress Note     Subjective: Having some abdominal pain but still tolerating PO intake without n/v   Objective: Vital signs in last 24 hours: Temp:  [97.7 F (36.5 C)-98.2 F (36.8 C)] 97.7 F (36.5 C) (05/06 0637) Pulse Rate:  [63-70] 70 (05/06 0637) Resp:  [17-18] 18 (05/05 1936) BP: (99-134)/(61-88) 109/88 (05/06 0637) SpO2:  [97 %-99 %] 98 % (05/06 0637)    Intake/Output from previous day: No intake/output data recorded. Intake/Output this shift: No intake/output data recorded.  PE: General: pleasant, WD, female who is sitting up in chair in NAD Lungs: Respiratory effort nonlabored Abd: soft, mild TTP without rebound or guarding MSK: all 4 extremities are symmetrical with no cyanosis, clubbing, or edema. Skin: warm and dry  Lab Results:  No results for input(s): "WBC", "HGB", "HCT", "PLT" in the last 72 hours. BMET No results for input(s): "NA", "K", "CL", "CO2", "GLUCOSE", "BUN", "CREATININE", "CALCIUM" in the last 72 hours. PT/INR No results for input(s): "LABPROT", "INR" in the last 72 hours. CMP     Component Value Date/Time   NA 137 06/16/2022 0429   NA 139 07/08/2015 1241   K 3.5 06/16/2022 0429   CL 103 06/16/2022 0429   CO2 27 06/16/2022 0429   GLUCOSE 94 06/16/2022 0429   BUN 15 06/16/2022 0429   BUN 22 07/08/2015 1241   CREATININE 0.70 06/16/2022 0429   CREATININE 0.75 02/03/2022 1419   CALCIUM 9.5 06/16/2022 0429   PROT 6.5 06/16/2022 0429   PROT 6.8 09/04/2014 1226   ALBUMIN 3.5 06/16/2022 0429   ALBUMIN 4.1 09/04/2014 1226   AST 38 06/16/2022 0429   ALT 50 (H) 06/16/2022 0429   ALKPHOS 120 06/16/2022 0429   BILITOT 0.5 06/16/2022 0429   BILITOT 0.4 09/04/2014 1226   GFRNONAA >60 06/16/2022 0429   GFRNONAA 54 (L) 05/20/2020 1550   GFRAA 63 05/20/2020 1550   Lipase     Component Value Date/Time   LIPASE 47 06/15/2022 1314       Studies/Results: No results found.  Anti-infectives: Anti-infectives (From admission, onward)     None        Assessment/Plan Liver mass, concern for hepatocellular carcinoma  CA19-9 WNL, AFP WNL Hepatitis panel negative MRI with cirrhotic liver and mass in right lobe concerning for hepatocellular carcinoma  Hepatobillary surgeon Dr. Freida Busman has assessed patient and will discuss at tumor board this week. I will arrange outpatient follow up.  I reviewed Consultant palliative care, oncology notes, hospitalist notes, last 24 h vitals and pain scores, last 48 h intake and output, last 24 h labs and trends, and last 24 h imaging results.    LOS: 4 days   Eric Form, [facility] 06/19/2022, 10:36 AM Please see Amion for pager number during day hours 7:00am-4:30pm

## 2022-06-19 NOTE — Progress Notes (Signed)
Palliative Medicine Inpatient Follow Up Note HPI: 89 year old female with a PMH of dementia, Sjogren's syndrome, PAF not on anticoagulation, chronic anxiety/depression, GERD, prediabetes, chronic constipation, known liver mass since 2019, recurrent UTIs, presented to the ED on 06/15/2022 with complaints of worsening abdominal pain.  CT abdomen showed findings consistent with liver mass/HCC.  General surgery and oncology were consulted, they plan to review her case at multidisciplinary tumor board next week.  Family wishes to follow-up with them as outpatient after that meeting.  Hospital course complicated by multifactorial delirium.  Therapies have evaluated and family wish to pursue SNF for STR.  TOC on board.    Palliative care has been asked to get involved for further goals of care conversations.   Today's Discussion 06/19/2022  *Please note that this is a verbal dictation therefore any spelling or grammatical errors are due to the "Dragon Medical One" system interpretation.  Chart reviewed inclusive of vital signs, progress notes, laboratory results, and diagnostic images.   I met with Ariana [name] at bedside this morning. She was alert and oriented. She is resting comfortably in NAD. She shares that she has had some intermittent abdominal pain. She says, "a man spoke to me" about her "mass". She is aware that this may be cancer. I shared the plan for further discussions at the tumor board. She is intermittently confused during conversation. I stated that I did speak to her daughter about this as well and she will ensure there is proper follow up.   Called patients daughter, Ariana [name]. And updated her.   Questions and concerns addressed/Palliative Support Provided.   Objective Assessment: Vital Signs Vitals:   06/18/22 1936 06/19/22 0637  BP: 134/61 109/88  Pulse: 63 70  Resp: 18   Temp: 97.9 F (36.6 C) 97.7 F (36.5 C)  SpO2: 99% 98%    Intake/Output Summary (Last 24 hours) at 06/19/2022  1230 Last data filed at 06/19/2022 1038 Gross per 24 hour  Intake 400 ml  Output --  Net 400 ml   Last Weight  Most recent update: 06/15/2022  1:12 PM    Weight  62.6 kg (138 lb)            Gen:  Elderly F in NAD HEENT: moist mucous membranes CV: Regular rate and rhythm  PULM: On RA, breathing is even and nonlabored ABD: soft/nontender  EXT: No edema  Neuro: Oriented to self only  SUMMARY OF RECOMMENDATIONS   DNAR/DNI    Patients daughter would like to hear from Oncology after tumor board regarding options for possible treatment   Hospice was discussed if treatment options are limited or too trial-some   OP Palliative care referral   Ongoing PMT support   Code Status/Advance Care Planning: DNAR/DNI   Symptom Management:  Delirium: - Lights and TV off, minimize interruptions at night -Blinds open and lights on during day -Glasses/hearing aid with patient -Get up during the day -Encourage a familiar face to remain present throughout the day -Frequent reorientation   Generalized Deconditioning: - PT/OT   Billing based on MDM: Low ______________________________________________________________________________________ Lamarr Lulas [place] Palliative Medicine Team Team Cell Phone: [phone] Please utilize secure chat with additional questions, if there is no response within 30 minutes please call the above phone number  Palliative Medicine Team providers are available by phone from 7am to 7pm daily and can be reached through the team cell phone.  Should this patient require assistance outside of these hours, please call the patient's attending physician.

## 2022-06-19 NOTE — Progress Notes (Signed)
Physical Therapy Treatment Patient Details Name: Ariana [name] MRN: [id number] DOB: 1929-08-13 Today's Date: 06/19/2022   History of Present Illness Patient is 89 y.o. female admitted 06/15/22 with complaints of worsening abdominal pain. CT abdomen showed findings consistent with liver mass/HCC. PMH of dementia, Sjogren's syndrome, PAF not on anticoagulation, anxiety/depression, GERD, prediabetes, constipation, known liver mass since 2019, recurrent UTIs    PT Comments    Pt received in bed, soiled in urine. Assisted OOB to [facility] for BM then to recliner. Total assist pericare. Min assist bed mobility, min assist sit to stand, and min assist amb 5' with RW. Pt in recliner with feet elevated at end of session, set up for breakfast.    Recommendations for follow up therapy are one component of a multi-disciplinary discharge planning process, led by the attending physician.  Recommendations may be updated based on patient status, additional functional criteria and insurance authorization.  Follow Up Recommendations  Can patient physically be transported by private vehicle: Yes    Assistance Recommended at Discharge Frequent or constant Supervision/Assistance  Patient can return home with the following A lot of help with walking and/or transfers;A lot of help with bathing/dressing/bathroom;Assistance with cooking/housework;Direct supervision/assist for medications management;Assist for transportation;Help with stairs or ramp for entrance   Equipment Recommendations  Other (comment) (TBD)    Recommendations for Other Services       Precautions / Restrictions Precautions Precautions: Fall Precaution Comments: frequent falls at home     Mobility  Bed Mobility Overal bed mobility: Needs Assistance Bed Mobility: Supine to Sit     Supine to sit: Min assist, HOB elevated     General bed mobility comments: increased time, use of bed pad to scoot to EOB    Transfers Overall transfer  level: Needs assistance Equipment used: 1 person hand held assist, Rolling walker (2 wheels) Transfers: Sit to/from Stand, Bed to chair/wheelchair/BSC Sit to Stand: Min assist   Step pivot transfers: Min assist       General transfer comment: pivot transfer bed to [facility]. RW for pivot transfer BSC to recliner with improved stability and fluidity of movement.    Ambulation/Gait Ambulation/Gait assistance: Min assist Gait Distance (Feet): 5 Feet Assistive device: Rolling walker (2 wheels) Gait Pattern/deviations: Trunk flexed, Step-through pattern, Decreased stride length Gait velocity: decreased         Stairs             Wheelchair Mobility    Modified Rankin (Stroke Patients Only)       Balance Overall balance assessment: Needs assistance Sitting-balance support: Feet supported, No upper extremity supported Sitting balance-Leahy Scale: Good     Standing balance support: Bilateral upper extremity supported, Single extremity supported, During functional activity, Reliant on assistive device for balance Standing balance-Leahy Scale: Poor                              Cognition Arousal/Alertness: Awake/alert Behavior During Therapy: WFL for tasks assessed/performed Overall Cognitive Status: History of cognitive impairments - at baseline                                 General Comments: Oriented to self only. Tangential speech. Perseverating on events going on at [employer].        Exercises      General Comments        Pertinent  Vitals/Pain Pain Assessment Pain Assessment: No/denies pain    Home Living                          Prior Function            PT Goals (current goals can now be found in the care plan section) Acute Rehab PT Goals Patient Stated Goal: not stated Progress towards PT goals: Progressing toward goals    Frequency    Min 3X/week      PT Plan Current plan remains  appropriate    Co-evaluation              AM-PAC PT "6 Clicks" Mobility   Outcome Measure  Help needed turning from your back to your side while in a flat bed without using bedrails?: A Little Help needed moving from lying on your back to sitting on the side of a flat bed without using bedrails?: A Little Help needed moving to and from a bed to a chair (including a wheelchair)?: A Little Help needed standing up from a chair using your arms (e.g., wheelchair or bedside chair)?: A Little Help needed to walk in hospital room?: A Little Help needed climbing 3-5 steps with a railing? : Total 6 Click Score: 16    End of Session Equipment Utilized During Treatment: Gait belt Activity Tolerance: Patient tolerated treatment well Patient left: in chair;with call bell/phone within reach;with chair alarm set Nurse Communication: Mobility status PT Visit Diagnosis: Unsteadiness on feet (R26.81);Muscle weakness (generalized) (M62.81);Difficulty in walking, not elsewhere classified (R26.2)     Time: 1610-9604 PT Time Calculation (min) (ACUTE ONLY): 25 min  Charges:  $Gait Training: 8-22 mins $Therapeutic Activity: 8-22 mins                     Ferd Glassing., PT  Office # [phone]    Ilda Foil 06/19/2022, 8:51 AM

## 2022-06-19 NOTE — Discharge Summary (Signed)
Physician Discharge Summary  Ariana [name] QMV:[id number] DOB: 07-01-1929  PCP: Caesar Bookman, NP  Admitted from: Home Discharged to: SNF  Admit date: 06/15/2022 Discharge date: 06/19/2022  Recommendations for Outpatient Follow-up:    Contact information for follow-up providers     MD at SNF. Schedule an appointment as soon as possible for a visit.   Why: To be seen in 3 to 4 days with repeat labs (CBC & CMP).  Kindly ensure that patient is followed up at the cancer center for final determination regarding management of her liver mass/suspected hepatocellular carcinoma.  Also recommend palliative care follow-up at SNF.        Ngetich, Donalee Citrin, NP. Schedule an appointment as soon as possible for a visit.   Specialty: Family Medicine Why: To be seen upon discharge from SNF. Contact information: [address] [place] [address] [phone]         Ladene Artist, MD. Schedule an appointment as soon as possible for a visit.   Specialty: Oncology Why: MDs office should also coordinate office follow-up appointment. Contact information: [address] [place] [address] [phone]         Fritzi Mandes, MD. Schedule an appointment as soon as possible for a visit.   Specialty: General Surgery Why: MDs office should also coordinate office follow-up appointment. Contact information: [address] [place] [address] [phone]              Contact information for after-discharge care     Destination     [facility] Preferred SNF .   Service: Skilled Nursing Contact information: [address] [place] [address] [phone]                      Home Health: None    Equipment/Devices: None    Discharge Condition: Improved and stable.   Code Status: DNR Diet recommendation:  Discharge Diet Orders (From admission, onward)     Start     Ordered   06/19/22 0000  Diet - low  sodium heart healthy        06/19/22 1146             Discharge Diagnoses:  Principal Problem:   Liver mass   Brief Summary: 89 year old female, lives alone but has caregivers on weekdays from 9 AM to 5 PM, has a walker and a cane which she does not use, PMH of dementia, Sjogren's syndrome, PAF not on anticoagulation, chronic anxiety/depression, GERD, prediabetes, chronic constipation, known liver mass since 2019, recurrent UTIs, presented to the ED on 06/15/2022 with complaints of worsening abdominal pain.  CT abdomen showed findings consistent with liver mass/HCC.  General surgery and oncology were consulted, they plan to review her case at multidisciplinary tumor board on 5/8.  Family wishes to follow-up with them as outpatient after that meeting.  Hospital course complicated by multifactorial delirium, resolved.  Therapies have evaluated and family wish to pursue SNF for STR.  TOC on board.     Assessment & Plan:   Right liver mass, most likely hepatocellular carcinoma: The mass apparently has been present for several years and has enlarged.  No prior MRI or biopsy.  Although no history of cirrhosis, current imaging shows cirrhosis.  CT abdomen: 9.9 x 9.4 x 9.2 cm rounded shaped mass within the inferior tip of the right lobe of the liver with heterogeneous enhancement with areas of focal internal  necrosis, based on appearance, mass most likely represents hepatocellular carcinoma.  MRI of liver: Cirrhotic liver with large enlarging aggressive appearing neoplasm in the inferior aspect of the right lobe of the liver which has imaging characteristics categorized as LR-5 definitive hepatocellular carcinoma.  AFP (wnl, 3.4), CA 19-9 pending.  Acute hepatitis panel negative.  General surgery and medical oncology input appreciated.  She is added to the multidisciplinary tumor board discussion for 06/21/2022.  As per general surgery consultation, family likely would not want very invasive interventions  and surgery may explore less invasive options such as percutaneous ablation or Y90.  As per medical oncology consultation, the mass appears resectable, but patient does not appear to be a surgical candidate and if the patient and family desire treatment, they recommend consultation with IR to consider embolization.  Multimodality pain control with least amount of opioids as needed given history of dementia and advanced age.  Bowel regimen.  Discussed extensively with patient's daughter/healthcare power of attorney on 5/2, prefers to wait until multidisciplinary tumor board discussion on 5/8 prior to proceeding with treatment.  Palliative care medicine consulted, DNR/DNI established.  As per their input, patient's daughter would like to hear from oncology after tumor board regarding options for possible treatment.  Outpatient palliative care referral.  Pain mostly controlled on infrequent doses of oral pain meds (has received 1/day dose of oxycodone on 2 days)   PAF with RVR: Not on anticoagulation PTA.  TTE in 2019 showed LVEF of 60-65%.  Repeat 2D echo 5/3: LVEF 60-65%.  Continue home dose of Cardizem CD 180 mg daily.  As needed IV metoprolol.  Patient also on aspirin 325 Mg daily, continue.  Left brief episode of A-fib with mild RVR, reverted to sinus rhythm and patient pulled off telemetry leads.  Discontinue telemetry.  TSH 2.11   Chronic constipation in the setting of large liver mass Irritable bowel syndrome-constipation Continue home Linzess.  Also on MiraLAX and Senokot-S.  CT abdomen did not comment on stool burden.  Given Dulcolax suppository this morning and had a BM on day of discharge.   Sjogren's disease Continue home regimen/pilocarpine 5 Mg twice daily Aspiration precautions   Chronic anxiety/depression Dementia/delirium Continue Namenda, Cymbalta 60 mg in the evening and 30 mg in the morning, Xanax as needed.  Also on Lyrica Fall precautions, aspiration precautions, delirium  precautions. Overnight 5/3 with agitated delirium, multifactorial related to dementia, very advanced age, hospitalization, pain and pain management and sleep issues.  Did receive a dose of IM Zyprexa on 5/3 night, brief bilateral soft wrist restraints and safety sitter. Delirium precautions.  As needed Seroquel.  EKG 5/3 with QTc of 467 ms.  Resolved.  All the Seroquel as needed was ordered, she has not required this at all.  Apart from that 1 night of agitated delirium, she has been mostly pleasant and appropriate.  Has not required safety sitter in more than 24 hours.   GERD Continue PPI.   HFpEF 60-65% from 2D echo done in 2019 Euvolemic on exam Continue prior home dose of Lasix and potassium supplements.   History of recurrent UTI UA not suggestive of UTI.  Continue home dose of Myrbetriq.   Prediabetes Last hemoglobin A1c 6.2 on 02/03/2022. Heart healthy, carb modified diet Insulin sliding scale for hyperglycemia. Januvia resumed at time of discharge.    Body mass index is 24.45 kg/m.    Consultants:   General surgery Medical oncology Palliative care medicine   Procedures:   None  Discharge Instructions  Discharge Instructions     (HEART FAILURE PATIENTS) Call MD:  Anytime you have any of the following symptoms: 1) 3 pound weight gain in 24 hours or 5 pounds in 1 week 2) shortness of breath, with or without a dry hacking cough 3) swelling in the hands, feet or stomach 4) if you have to sleep on extra pillows at night in order to breathe.   Complete by: As directed    Call MD for:  difficulty breathing, headache or visual disturbances   Complete by: As directed    Call MD for:  extreme fatigue   Complete by: As directed    Call MD for:  persistant dizziness or light-headedness   Complete by: As directed    Call MD for:  persistant nausea and vomiting   Complete by: As directed    Call MD for:  severe uncontrolled pain   Complete by: As directed    Call MD for:   temperature >100.4   Complete by: As directed    Diet - low sodium heart healthy   Complete by: As directed    Increase activity slowly   Complete by: As directed         Medication List     STOP taking these medications    hydrocortisone cream 1 %       TAKE these medications    acetaminophen 500 MG tablet Commonly known as: TYLENOL Take 1,000 mg by mouth 2 (two) times daily.   ALPRAZolam 0.25 MG tablet Commonly known as: XANAX Take 1 tablet (0.25 mg total) by mouth 2 (two) times daily as needed for anxiety. What changed:  reasons to take this additional instructions   antiseptic oral rinse Liqd 15 mLs by Mouth Rinse route as needed.   aspirin 325 MG tablet Take 325 mg by mouth daily. Takes at 5pm.   bisacodyl 5 MG EC tablet Commonly known as: DULCOLAX Take 1 tablet (5 mg total) by mouth daily as needed for moderate constipation.   Calcium 600+D 600-20 MG-MCG Tabs Generic drug: Calcium Carb-Cholecalciferol Take 600 mcg by mouth daily.   carboxymethylcellulose 0.5 % Soln Commonly known as: REFRESH PLUS Place 1 drop into both eyes in the morning, at noon, in the evening, and at bedtime.   CRANBERRY PO Take 2 tablets by mouth daily. AZO   diltiazem 180 MG 24 hr capsule Commonly known as: CARDIZEM CD Take 1 capsule by mouth once daily   DULoxetine 30 MG capsule Commonly known as: CYMBALTA Take 1 capsule (30 mg total) by mouth daily. Takes at 9am What changed: See the new instructions.   DULoxetine 60 MG capsule Commonly known as: CYMBALTA Take 1 capsule (60 mg total) by mouth daily. Takes at 5pm. What changed: See the new instructions.   furosemide 40 MG tablet Commonly known as: LASIX Take 1 tablet by mouth once daily   Januvia 100 MG tablet Generic drug: sitaGLIPtin Take 1 tablet by mouth once daily   Lancets Micro Thin 33G Misc Use to test blood sugar daily. Dx: E08.65   lansoprazole 30 MG capsule Commonly known as: PREVACID TAKE 1  CAPSULE BY MOUTH ONCE DAILY AT NOON What changed: See the new instructions.   Linzess 290 MCG Caps capsule Generic drug: linaclotide TAKE 1 CAPSULE BY MOUTH EVERY DAY AT BEDTIME What changed: See the new instructions.   loratadine 10 MG tablet Commonly known as: CLARITIN Take 1 tablet (10 mg total) by mouth daily.   memantine  10 MG tablet Commonly known as: NAMENDA Take 1 tablet by mouth twice daily   Myrbetriq 50 MG Tb24 tablet Generic drug: mirabegron ER Take 1 tablet by mouth once daily   OneTouch Verio test strip Generic drug: glucose blood USE 1 STRIP TO CHECK GLUCOSE ONCE DAILY   oxyCODONE 5 MG immediate release tablet Commonly known as: Oxy IR/ROXICODONE Take 1 tablet (5 mg total) by mouth every 12 (twelve) hours as needed for moderate pain, breakthrough pain or severe pain.   pilocarpine 5 MG tablet Commonly known as: SALAGEN Take 1 tablet by mouth twice daily   polyethylene glycol 17 g packet Commonly known as: MIRALAX / GLYCOLAX Take 17 g by mouth daily. Mix with 8 oz. water or juice What changed:  when to take this reasons to take this   potassium chloride SA 20 MEQ tablet Commonly known as: KLOR-CON M TAKE 1  BY MOUTH ONCE DAILY What changed: See the new instructions.   pregabalin 100 MG capsule Commonly known as: LYRICA Take 1 capsule (100 mg total) by mouth 2 (two) times daily.   saccharomyces boulardii 250 MG capsule Commonly known as: Florastor Take 1 capsule (250 mg total) by mouth daily.   senna-docusate 8.6-50 MG tablet Commonly known as: Senokot-S Take 1 tablet by mouth 2 (two) times daily.       Allergies  Allergen Reactions   Banana Nausea And Vomiting   Codeine Nausea Only    unless given with Phenergan   Klonopin [Clonazepam] Other (See Comments)    Causes hallucination    Meperidine Hcl Nausea Only    unless given with Phenergan   Norflex [Orphenadrine Citrate] Nausea Only    Unless given with Phenergan    Oxycodone-Acetaminophen Nausea Only    unless given with phenergan   Propoxyphene Hcl Nausea Only    unless given with phenergan   Zoloft [Sertraline Hcl] Other (See Comments)    Caused lethargy   Doxycycline Other (See Comments)    Unknown reaction   Naproxen Other (See Comments)    Unknown reaction   Penicillins Other (See Comments)    Unknown reaction Has patient had a PCN reaction causing immediate rash, facial/tongue/throat swelling, SOB or lightheadedness with hypotension: Unknown Has patient had a PCN reaction causing severe rash involving mucus membranes or skin necrosis: Unknown Has patient had a PCN reaction that required hospitalization: pt was in the hospital at time of reaction Has patient had a PCN reaction occurring within the last 10 years: Unknown If all of the above answers are "NO", then may proceed with Cephalos   Phenothiazines Other (See Comments)    Unknown reaction   Stelazine Other (See Comments)    Unknown reaction   Sulfamethoxazole-Trimethoprim Other (See Comments)    Unknown reaction   Tolectin [Tolmetin Sodium] Other (See Comments)    Unknown reaction   Tramadol Other (See Comments)    Unknown reaction      Procedures/Studies: ECHOCARDIOGRAM COMPLETE  Result Date: 06/16/2022    ECHOCARDIOGRAM REPORT   Patient Name:   CAMIE KNABLE Date of Exam: 06/16/2022 Medical Rec #:  [id number]      Height:       63.0 in Accession #:    [id number]     Weight:       138.0 lb Date of Birth:  December 06, 1929      BSA:          1.652 m Patient Age:    89 years  BP:           145/70 mmHg Patient Gender: F              HR:           108 bpm. Exam Location:  Inpatient Procedure: 2D Echo Indications:    atrial fibrillation  History:        Patient has prior history of Echocardiogram examinations, most                 recent 04/25/2017. Arrythmias:Atrial Fibrillation; Risk                 Factors:Dyslipidemia.  Sonographer:    Delcie Roch RDCS Referring Phys: [id number] Charlee Squibb D  Tesia Lybrand IMPRESSIONS  1. Left ventricular ejection fraction, by estimation, is 60 to 65%. The left ventricle has normal function. The left ventricle has no regional wall motion abnormalities. There is moderate concentric left ventricular hypertrophy. Left ventricular diastolic function could not be evaluated.  2. Right ventricular systolic function is normal. The right ventricular size is normal. Tricuspid regurgitation signal is inadequate for assessing PA pressure.  3. The mitral valve is normal in structure. Trivial mitral valve regurgitation. No evidence of mitral stenosis.  4. The aortic valve is tricuspid. Aortic valve regurgitation is not visualized. No aortic stenosis is present.  5. The inferior vena cava is normal in size with greater than 50% respiratory variability, suggesting right atrial pressure of 3 mmHg. FINDINGS  Left Ventricle: Left ventricular ejection fraction, by estimation, is 60 to 65%. The left ventricle has normal function. The left ventricle has no regional wall motion abnormalities. The left ventricular internal cavity size was normal in size. There is  moderate concentric left ventricular hypertrophy. Left ventricular diastolic function could not be evaluated due to atrial fibrillation. Left ventricular diastolic function could not be evaluated. Right Ventricle: The right ventricular size is normal. Right ventricular systolic function is normal. Tricuspid regurgitation signal is inadequate for assessing PA pressure. The tricuspid regurgitant velocity is 2.24 m/s, and with an assumed right atrial  pressure of 3 mmHg, the estimated right ventricular systolic pressure is 23.1 mmHg. Left Atrium: Left atrial size was normal in size. Right Atrium: Right atrial size was normal in size. Pericardium: Trivial pericardial effusion is present. Mitral Valve: The mitral valve is normal in structure. Mild mitral annular calcification. Trivial mitral valve regurgitation. No evidence of mitral valve  stenosis. Tricuspid Valve: The tricuspid valve is normal in structure. Tricuspid valve regurgitation is mild . No evidence of tricuspid stenosis. Aortic Valve: The aortic valve is tricuspid. Aortic valve regurgitation is not visualized. No aortic stenosis is present. Pulmonic Valve: The pulmonic valve was normal in structure. Pulmonic valve regurgitation is not visualized. No evidence of pulmonic stenosis. Aorta: The aortic root is normal in size and structure. Venous: The inferior vena cava is normal in size with greater than 50% respiratory variability, suggesting right atrial pressure of 3 mmHg. IAS/Shunts: No atrial level shunt detected by color flow Doppler.  LEFT VENTRICLE PLAX 2D LVIDd:         3.90 cm   Diastology LVIDs:         2.60 cm   LV e' medial: 7.18 cm/s LV PW:         1.00 cm LV IVS:        1.00 cm LVOT diam:     2.10 cm LV SV:         30 LV SV Index:   18  LVOT Area:     3.46 cm  RIGHT VENTRICLE             IVC RV Basal diam:  2.30 cm     IVC diam: 1.40 cm RV S prime:     12.30 cm/s TAPSE (M-mode): 1.7 cm LEFT ATRIUM             Index        RIGHT ATRIUM           Index LA diam:        3.70 cm 2.24 cm/m   RA Area:     10.40 cm LA Vol (A2C):   48.7 ml 29.49 ml/m  RA Volume:   23.10 ml  13.99 ml/m LA Vol (A4C):   39.4 ml 23.86 ml/m LA Biplane Vol: 43.9 ml 26.58 ml/m  AORTIC VALVE LVOT Vmax:   63.60 cm/s LVOT Vmean:  39.900 cm/s LVOT VTI:    0.087 m  AORTA Ao Root diam: 3.20 cm Ao Asc diam:  3.10 cm TRICUSPID VALVE TR Peak grad:   20.1 mmHg TR Vmax:        224.00 cm/s  SHUNTS Systemic VTI:  0.09 m Systemic Diam: 2.10 cm Olga Millers MD Electronically signed by Olga Millers MD Signature Date/Time: 06/16/2022/2:10:47 PM    Final    MR LIVER W WO CONTRAST  Result Date: 06/16/2022 CLINICAL DATA:  89 year old female with suspected hepatocellular carcinoma. EXAM: MRI ABDOMEN WITHOUT AND WITH CONTRAST TECHNIQUE: Multiplanar multisequence MR imaging of the abdomen was performed both before and  after the administration of intravenous contrast. CONTRAST:  6mL GADAVIST GADOBUTROL 1 MMOL/ML IV SOLN COMPARISON:  Abdominal MRI 03/31/2019. CT of the abdomen and pelvis 06/15/2022. FINDINGS: Lower chest: Unremarkable. Hepatobiliary: Liver has a slightly shrunken appearance and nodular contour, suggesting underlying cirrhosis. Again noted is a large mass in the inferior aspect of the right lobe of the liver involving portions of segments 5 and 6 (axial image 118 of series 902 and coronal image 64 of series 10) estimated to measure 9.6 x 9.6 x 9.4 cm. This lesion is heterogeneous in signal intensity on T1 and T2 weighted images, and demonstrates heterogeneous enhancement on post gadolinium images with some areas that are hypervascular and other areas that appear hypovascular during arterial phase imaging. There does appear to be a persistent area of peripheral enhancement on delayed post gadolinium imaging. Small internal areas of T1 hyperintensity are noted on precontrast imaging (axial image 133 of series 900), indicative of some internal areas of hemorrhage. No other new hepatic lesions are noted. No intrahepatic biliary ductal dilatation. Common bile duct is mildly dilated measuring 9 mm in the porta hepatis, which is within normal limits given the patient's age. No filling defects in the common bile duct to suggest choledocholithiasis. Status post cholecystectomy. Pancreas: No pancreatic mass. No pancreatic ductal dilatation. No pancreatic or peripancreatic fluid collections or inflammatory changes. Spleen:  Unremarkable. Adrenals/Urinary Tract: Right kidney and bilateral adrenal glands are normal in appearance. Multiple small lesions are noted throughout the left kidney ranging from T1 hypointense and T2 hyperintense with no enhancement, to T1 hyperintense and T2 hypointense with no enhancement, compatible with a combination of Bosniak class 1 and Bosniak class 2 cysts (benign requiring no future imaging  follow-up). No hydroureteronephrosis in the visualized portions of the abdomen. Stomach/Bowel: Visualized portions are unremarkable. Vascular/Lymphatic: No aneurysm identified in the visualized abdominal vasculature. No lymphadenopathy noted in the abdomen. Other: No significant volume of ascites noted in  the visualized portions of the peritoneal cavity. Musculoskeletal: No aggressive appearing osseous lesions are noted in the visualized portions of the skeleton. Cavernous hemangioma in the L3 vertebral body incidentally noted. IMPRESSION: 1. Cirrhotic liver with large enlarging aggressive appearing neoplasm in the inferior aspect of the right lobe of the liver which has imaging characteristics (size, enhancing capsule and threshold growth) categorized as LR-5 definitive hepatocellular carcinoma. Correlation with pending AFP levels is recommended. 2. Additional incidental findings, as above. Electronically Signed   By: Trudie Reed M.D.   On: 06/16/2022 08:55   CT ABDOMEN PELVIS W CONTRAST  Result Date: 06/15/2022 CLINICAL DATA:  Right lower quadrant abdominal pain. Known right inferior hepatic mass. EXAM: CT ABDOMEN AND PELVIS WITH CONTRAST TECHNIQUE: Multidetector CT imaging of the abdomen and pelvis was performed using the standard protocol following bolus administration of intravenous contrast. RADIATION DOSE REDUCTION: This exam was performed according to the departmental dose-optimization program which includes automated exposure control, adjustment of the mA and/or kV according to patient size and/or use of iterative reconstruction technique. CONTRAST:  65mL OMNIPAQUE IOHEXOL 350 MG/ML SOLN COMPARISON:  02/14/2021 FINDINGS: Lower chest: No acute abnormality. Hepatobiliary: Enlargement rounded shape mass within the inferior tip of the right lobe of the liver causing increased capsular distension and now measuring roughly 9.9 x 9.4 x 9.2 cm. The mass demonstrates heterogeneous enhancement with areas of  focal internal necrosis. Due to the mass, the inferior extent of the right lobe of the liver extends into the upper right pelvis. No evidence by CT of spontaneous rupture or bleed of the mass. The rest of the liver is unremarkable without evidence of biliary dilatation. The gallbladder has been removed. Pancreas: Stable atrophic pancreas. Spleen: Normal in size without focal abnormality. Adrenals/Urinary Tract: Adrenal glands are unremarkable. No hydronephrosis. No calculi or solid renal masses. Bladder is unremarkable. Stomach/Bowel: Bowel shows no evidence of obstruction, ileus, inflammation or lesion. The appendix is absent. No free intraperitoneal air. Stable diverticulosis of the sigmoid colon without evidence of diverticulitis. Vascular/Lymphatic: No significant vascular findings are present. No enlarged abdominal or pelvic lymph nodes. Reproductive: Status post hysterectomy. No adnexal masses. Other: Stable small left inguinal hernia containing fat. No abscess or ascites. Musculoskeletal: Stable degenerative disc disease at L4-5 and L5-S1. Osteopenia without lumbar or lower thoracic compression fractures. IMPRESSION: 1. Enlargement of rounded shape mass within the inferior tip of the right lobe of the liver causing increased capsular distension and now measuring roughly 9.9 x 9.4 x 9.2 cm. The mass demonstrates heterogeneous enhancement with areas of focal internal necrosis. No evidence by CT of spontaneous rupture or bleed of the mass. The increased capsular distension of the liver is likely the cause of right lower abdominal pain. Based on appearance, this mass most likely represents a hepatocellular carcinoma. 2. Stable diverticulosis of the sigmoid colon without evidence of diverticulitis. 3. Stable small left inguinal hernia containing fat. 4. Osteopenia and degenerative disc disease at L4-5 and L5-S1. Electronically Signed   By: Irish Lack M.D.   On: 06/15/2022 17:22      Subjective: Denies,  pains.  Pleasantly confused.  States that the previous doctor "mashed on my liver" with some soreness.  Discharge Exam:  Vitals:   06/18/22 0626 06/18/22 1552 06/18/22 1936 06/19/22 0637  BP: 129/72 99/78 134/61 109/88  Pulse: 63 66 63 70  Resp: 18 17 18    Temp: 98.3 F (36.8 C) 98.2 F (36.8 C) 97.9 F (36.6 C) 97.7 F (36.5 C)  TempSrc: Oral  Oral Oral Oral  SpO2: 97% 97% 99% 98%  Weight:      Height:        General exam: Elderly female, moderately built, frail and thinly nourished, sitting up comfortably in bed eating breakfast by herself.  Did not appear in any distress.  Oral mucosa moist. Respiratory system: Clear to auscultation.  No increased work of breathing. Cardiovascular system: S1 and S2 heard, RRR.  No JVD, murmurs or pedal edema.  Off telemetry. Gastrointestinal system: Abdomen is nondistended, soft.  Nondiscrete right upper quadrant mildly tender mass, globular, nonpulsatile.  No other masses or organomegaly appreciated.  Normal bowel sounds heard. Central nervous system: Alert and oriented x 2. No focal neurological deficits.  Much more pleasant, coherent, answers questions appropriately. Extremities: Symmetric 5 x 5 power. Skin: No rashes, lesions or ulcers Psychiatry: Judgement and insight impaired. Mood & affect appropriate.     The results of significant diagnostics from this hospitalization (including imaging, microbiology, ancillary and laboratory) are listed below for reference.     Microbiology: No results found for this or any previous visit (from the past 240 hour(s)).   Labs: CBC: Recent Labs  Lab 06/15/22 1314 06/16/22 0429  WBC 5.5 6.1  HGB 12.0 11.7*  HCT 37.1 35.5*  MCV 95.4 94.2  PLT 216 193    Basic Metabolic Panel: Recent Labs  Lab 06/15/22 1314 06/16/22 0429  NA 133* 137  K 3.9 3.5  CL 100 103  CO2 25 27  GLUCOSE 120* 94  BUN 19 15  CREATININE 0.79 0.70  CALCIUM 9.8 9.5  MG  --  2.3  PHOS  --  3.5    Liver Function  Tests: Recent Labs  Lab 06/15/22 1314 06/16/22 0429  AST 41 38  ALT 52* 50*  ALKPHOS 126 120  BILITOT 0.7 0.5  PROT 7.4 6.5  ALBUMIN 3.9 3.5    Urinalysis    Component Value Date/Time   COLORURINE YELLOW 06/15/2022 2053   APPEARANCEUR HAZY (A) 06/15/2022 2053   APPEARANCEUR Clear 07/08/2015 1241   LABSPEC 1.016 06/15/2022 2053   PHURINE 7.0 06/15/2022 2053   GLUCOSEU NEGATIVE 06/15/2022 2053   GLUCOSEU NEGATIVE 07/14/2011 1312   HGBUR NEGATIVE 06/15/2022 2053   BILIRUBINUR NEGATIVE 06/15/2022 2053   BILIRUBINUR Negative 06/17/2021 1445   BILIRUBINUR Negative 07/08/2015 1241   KETONESUR NEGATIVE 06/15/2022 2053   PROTEINUR NEGATIVE 06/15/2022 2053   UROBILINOGEN negative (A) 06/17/2021 1445   UROBILINOGEN 0.2 10/06/2016 1451   NITRITE NEGATIVE 06/15/2022 2053   LEUKOCYTESUR MODERATE (A) 06/15/2022 2053      Time coordinating discharge: 25 minutes  SIGNED:  Marcellus Scott, MD,  FACP, FHM, [facility], [facility], [facility]   [facility] Advisor [place]     To contact the attending provider between 7A-7P or the covering provider during after hours 7P-7A, please log into the web site [email or web address] and access using universal [place] password for that web site. If you do not have the password, please call the hospital operator.

## 2022-06-19 NOTE — Discharge Instructions (Signed)

## 2022-06-19 NOTE — Progress Notes (Signed)
IP PROGRESS NOTE  Subjective:   Ariana [name] was alert when I saw her at approximately 6:30 AM this morning.  She appeared comfortable. No specific complaint.  She acknowledged persistent right abdominal discomfort. Objective: Vital signs in last 24 hours: Blood pressure 109/88, pulse 70, temperature 97.7 F (36.5 C), temperature source Oral, resp. rate 18, height 5\' 3"  (1.6 m), weight 138 lb (62.6 kg), SpO2 98 %.   Physical Exam:  Abdomen: Lateral right upper abdominal mass, the abdomen is soft, nontender     Medications: I have reviewed the patient's current medications.  Assessment/Plan:   Right liver mass CT abdomen/pelvis 06/15/2022-enlargement of rounded mass in the inferior tip of the right liver, now measuring 9.9 x 9.4 x 9.2 cm, most consistent with a hepatocellular carcinoma Right liver mass present on multiple CT examinations dating to September 2020 MRI liver 06/15/2022-cirrhotic liver, enlarging inferior right liver mass,LR-5 06/15/2022-AFP 3.4, CA 19-9 7   2.  Pain secondary to #1 3.  Dementia 4.  History of atrial fibrillation 5.  Sjogren syndrome 6.  Recurrent urinary tract infection 7.  Anxiety/depression 8.  Diabetes 9.  Hysterectomy 10.  Cystectomy   [name] has a right liver mass.  The clinical presentation and imaging features are most consistent with hepatocellular carcinoma.  She appears to have mild discomfort associated with the right abdominal mass.  [name] has dementia in addition to other comorbid conditions.  She does not appear to be a candidate for resection of the mass or systemic therapy.  Treatment options will include comfort care versus palliative hepatic directed therapy.  Her case will be presented at the GI tumor conference on 06/21/2022.  We will arrange for outpatient follow-up at the Cancer center versus consultation with interventional radiology depending on the conference discussion.    LOS: 4 days   Thornton Papas, MD    06/19/2022, 1:52 PM

## 2022-06-19 NOTE — Care Management Important Message (Signed)
Important Message  Patient Details  Name: Ariana [name] MRN: [id number] Date of Birth: 1929-05-27   Medicare Important Message Given:  Yes     Sherilyn Banker 06/19/2022, 1:36 PM

## 2022-06-19 NOTE — TOC Transition Note (Signed)
Transition of Care [facility]) - CM/SW Discharge Note   Patient Details  Name: Ariana [name] MRN: [id number] Date of Birth: [birth year]  Transition of Care [facility]) CM/SW Contact:  Carley Hammed, LCSW Phone Number: 06/19/2022, 11:42 AM   Clinical Narrative:     Pt to be transported to [facility] via [place]. Nurse to call report to [phone].  Final next level of care: Skilled Nursing Facility Barriers to Discharge: Barriers Resolved   Patient Goals and CMS Choice CMS Medicare.gov Compare Post Acute Care list provided to:: Patient Represenative (must comment) Choice offered to / list presented to : Adult Children  Discharge Placement                Patient chooses bed at: [facility] Patient to be transferred to facility by: [facility] Name of family member notified: [name] Patient and family notified of of transfer: 06/19/22  Discharge Plan and Services Additional resources added to the After Visit Summary for   In-house Referral: Clinical Social Work   Post Acute Care Choice: Skilled Nursing Facility                               Social Determinants of Health (SDOH) Interventions SDOH Screenings   Food Insecurity: No Food Insecurity (06/16/2022)  Housing: Medium Risk (06/16/2022)  Transportation Needs: No Transportation Needs (06/16/2022)  Utilities: Not At Risk (06/16/2022)  Alcohol Screen: Low Risk  (03/07/2018)  Depression (PHQ2-9): Low Risk  (11/18/2020)  Financial Resource Strain: Low Risk  (11/05/2017)  Physical Activity: Inactive (11/05/2017)  Social Connections: Moderately Isolated (11/05/2017)  Stress: No Stress Concern Present (11/05/2017)  Tobacco Use: Medium Risk (06/15/2022)     Readmission Risk Interventions     No data to display

## 2022-06-20 DIAGNOSIS — R160 Hepatomegaly, not elsewhere classified: Secondary | ICD-10-CM | POA: Diagnosis not present

## 2022-06-20 DIAGNOSIS — E7800 Pure hypercholesterolemia, unspecified: Secondary | ICD-10-CM | POA: Diagnosis not present

## 2022-06-20 DIAGNOSIS — M545 Low back pain, unspecified: Secondary | ICD-10-CM | POA: Diagnosis not present

## 2022-06-20 DIAGNOSIS — M3500 Sicca syndrome, unspecified: Secondary | ICD-10-CM | POA: Diagnosis not present

## 2022-06-21 ENCOUNTER — Other Ambulatory Visit: Payer: Self-pay

## 2022-06-21 NOTE — Progress Notes (Signed)
The proposed treatment discussed in conference is for discussion purpose only and is not a binding recommendation.  The patients have not been physically examined, or presented with their treatment options.  Therefore, final treatment plans cannot be decided.  

## 2022-06-22 DIAGNOSIS — K7469 Other cirrhosis of liver: Secondary | ICD-10-CM | POA: Diagnosis not present

## 2022-06-22 DIAGNOSIS — R2689 Other abnormalities of gait and mobility: Secondary | ICD-10-CM | POA: Diagnosis not present

## 2022-06-22 DIAGNOSIS — R2681 Unsteadiness on feet: Secondary | ICD-10-CM | POA: Diagnosis not present

## 2022-06-22 DIAGNOSIS — R41841 Cognitive communication deficit: Secondary | ICD-10-CM | POA: Diagnosis not present

## 2022-06-23 DIAGNOSIS — M6281 Muscle weakness (generalized): Secondary | ICD-10-CM | POA: Diagnosis not present

## 2022-06-26 DIAGNOSIS — M6281 Muscle weakness (generalized): Secondary | ICD-10-CM | POA: Diagnosis not present

## 2022-06-26 DIAGNOSIS — I10 Essential (primary) hypertension: Secondary | ICD-10-CM | POA: Diagnosis not present

## 2022-06-26 DIAGNOSIS — R2689 Other abnormalities of gait and mobility: Secondary | ICD-10-CM | POA: Diagnosis not present

## 2022-06-26 DIAGNOSIS — I482 Chronic atrial fibrillation, unspecified: Secondary | ICD-10-CM | POA: Diagnosis not present

## 2022-06-26 DIAGNOSIS — R41841 Cognitive communication deficit: Secondary | ICD-10-CM | POA: Diagnosis not present

## 2022-06-26 DIAGNOSIS — C228 Malignant neoplasm of liver, primary, unspecified as to type: Secondary | ICD-10-CM | POA: Diagnosis not present

## 2022-06-26 DIAGNOSIS — R2681 Unsteadiness on feet: Secondary | ICD-10-CM | POA: Diagnosis not present

## 2022-06-26 DIAGNOSIS — K7469 Other cirrhosis of liver: Secondary | ICD-10-CM | POA: Diagnosis not present

## 2022-06-28 DIAGNOSIS — E871 Hypo-osmolality and hyponatremia: Secondary | ICD-10-CM | POA: Diagnosis not present

## 2022-06-28 DIAGNOSIS — I10 Essential (primary) hypertension: Secondary | ICD-10-CM | POA: Diagnosis not present

## 2022-06-29 DIAGNOSIS — R2689 Other abnormalities of gait and mobility: Secondary | ICD-10-CM | POA: Diagnosis not present

## 2022-06-29 DIAGNOSIS — R2681 Unsteadiness on feet: Secondary | ICD-10-CM | POA: Diagnosis not present

## 2022-06-29 DIAGNOSIS — R41841 Cognitive communication deficit: Secondary | ICD-10-CM | POA: Diagnosis not present

## 2022-06-29 DIAGNOSIS — K7469 Other cirrhosis of liver: Secondary | ICD-10-CM | POA: Diagnosis not present

## 2022-06-30 ENCOUNTER — Non-Acute Institutional Stay: Payer: Self-pay | Admitting: Hospice

## 2022-06-30 DIAGNOSIS — R1031 Right lower quadrant pain: Secondary | ICD-10-CM

## 2022-06-30 DIAGNOSIS — Z515 Encounter for palliative care: Secondary | ICD-10-CM

## 2022-06-30 DIAGNOSIS — F0390 Unspecified dementia without behavioral disturbance: Secondary | ICD-10-CM

## 2022-06-30 DIAGNOSIS — R531 Weakness: Secondary | ICD-10-CM

## 2022-06-30 DIAGNOSIS — F339 Major depressive disorder, recurrent, unspecified: Secondary | ICD-10-CM

## 2022-06-30 DIAGNOSIS — K5901 Slow transit constipation: Secondary | ICD-10-CM

## 2022-06-30 NOTE — Progress Notes (Signed)
Therapist, nutritional Palliative Care Consult Note Telephone: [phone]  Fax: [phone]  PATIENT NAME: Ariana [name] [address] [place] [address] [phone] (home)  DOB: 1930/01/06 MRN: [id number]  PRIMARY CARE PROVIDER:    Caesar Bookman, NP,  [address] [place] [address] [phone]  REFERRING PROVIDER:   Dr Jaci Lazier  RESPONSIBLE PARTY:   Gita Kudo Information     Name Relation Home Work Mobile   [name],Dell Clint Lipps Daughter   [phone]   Georgina Peer   [phone]        I met face to face with patient in the facility. Visit to build trust and highlight Palliative Medicine as specialized medical care for people living with serious illness, aimed at facilitating better quality of life through symptoms relief, assisting with advance care planning and complex medical decision making. NP called Erskine Squibb and updated her on visit.  She provided additional history since patient with cognitive deficits.  Palliative care team will continue to support patient, patient's family, and medical team.  ASSESSMENT AND / RECOMMENDATIONS:    CODE STATUS: Do Not Resuscitate.   Goals of Care: Goals include to maximize quality of life and symptom management. Fayrene Fearing said that family is interested in hospice service in the future.  Symptom Management/Plan: Weakness: Generalized, worsened with recent hospitalization 5/2-06/19/22 for abdominal pain related to liver mass most likely hepatocellular carcinoma, per Epic chart review. PT/OT for strengthening and mobility. Fall precautions  Liver Mass: Most likely hepatocellular carcinoma.  Follow-up with oncology to determine treatment/management of liver mass.  Monitor for abdominal pain/discomfort and treat as ordered. Erskine Squibb reports f/u visit is scheduled in June '24.  Depression. Continue Duloxetine as ordered.   Encourage socialization and participation in facility  activities. Dementia Constipation: Managed with Miralax and Senokot-S  Dementia: Continue Namenda as ordered.  Provide cueing and assistance with ADLs as needed.  Fall/safety precautions. Routine CBC CMP.   Follow up: Palliative care will continue to follow for complex medical decision making, advance care planning, and clarification of goals. Return 6 weeks or prn. Encouraged to call provider sooner with any concerns.   Family /Caregiver/Community Supports:   HOSPICE ELIGIBILITY/DIAGNOSIS: TBD  Chief Complaint: Initial Palliative care visit  HISTORY OF PRESENT ILLNESS:  Ariana [name] is a 89 y.o. year old female  with multiple morbidities requiring close monitoring/management, and with high risk of complications and  mortality:  dementia, Sjogren's syndrome, PAF not on anticoagulation, chronic anxiety/depression, GERD, prediabetes, chronic constipation, known liver mass since 2019. Recent hospitalization 5/2-06/19/22 for abdominal pain related to liver mass most likely hepatocellular carcinoma, per Epic chart review.  History obtained from review of EMR, discussion with primary team, caregiver, family and/or Ms. Alinda Money.  Review and summarization of Epic records shows history from other than patient. Rest of 10 point ROS asked and negative. Independent interpretation of tests and reviewed as needed, available labs, patient records, imaging, studies and related documents from the EMR.  PHYSICAL EXAM: GEN: in no acute distress  Cardiac: S1 S2, no LE edema feet/ankles  Respiratory:  clear to auscultation bilaterally, GI: soft, nontender,  + BS   MS: Moving all extremities, no edema to BLE   Skin: warm and dry, no rash to visible skin Neuro: Generalized weakness, nonfocal Psych: non-anxious affect   PAST MEDICAL HISTORY:  Active Ambulatory Problems    Diagnosis Date Noted   ANXIETY DEPRESSION 07/02/2007   Mononeuritis 07/02/2007   Tear film insufficiency 07/06/2009  HYPERTENSION,  BENIGN 07/02/2007   MITRAL VALVE PROLAPSE 11/05/2007   GERD 07/02/2007   Irritable bowel syndrome 07/02/2007   SJOGREN'S SYNDROME 07/06/2009   Fibromyalgia 07/02/2007   Urge urinary incontinence 01/06/2009   COLONIC POLYPS, ADENOMATOUS, HX OF 04/15/2008   Hypercholesterolemia 07/08/2010   Multinodular thyroid 01/15/2012   Neck pain 01/15/2012   Headache(784.0) 09/05/2012   Abnormality of gait 12/05/2012   Hereditary and idiopathic peripheral neuropathy 05/22/2013   Palpitations 06/30/2013   Osteopenia 07/17/2013   UTI (lower urinary tract infection) 07/20/2013   Prolonged QT interval 07/20/2013   Protein-calorie malnutrition, severe (HCC) 07/22/2013   Mixed stress and urge urinary incontinence 11/03/2013   Therapeutic opioid induced constipation 11/03/2013   Hemorrhoid 11/03/2013   Low back pain associated with a spinal disorder other than radiculopathy or spinal stenosis 11/03/2013   Constipation 04/15/2014   Atrial fibrillation with RVR (HCC) 07/03/2014   CVA (cerebral infarction) 07/26/2014   Infarction of parietal lobe (HCC)    Mild dementia (HCC)    Falls frequently 07/28/2014   History of cerebrovascular disease 09/24/2014   AKI (acute kidney injury) (HCC) 12/03/2014   Acute encephalopathy 12/04/2014   Fall    Chest pain 12/16/2014   TIA (transient ischemic attack) 12/17/2014   PAF (paroxysmal atrial fibrillation) (HCC) 04/18/2015   Dehydration 04/18/2015   Dementia without behavioral disturbance (HCC) 04/18/2015   Bradycardia 06/29/2015   Chronic lower back pain 06/29/2015   Arterial hypotension    Diastolic dysfunction 07/08/2015   Hypertonicity, bladder 07/08/2015   Senile dementia, with behavioral disturbance (HCC) 08/16/2015   Swelling 08/16/2015   Sjogren's disease (HCC) 04/03/2016   Primary osteoarthritis of both hands 04/03/2016   High risk medication use 04/03/2016   A-fib (HCC) 04/23/2017   Diabetes mellitus type 2 in nonobese (HCC) 04/23/2017    Diabetes mellitus due to underlying condition, uncontrolled 09/04/2017   History of rectal bleeding 09/04/2017   Diabetic neuropathy (HCC) 01/28/2019   Subarachnoid hemorrhage (HCC) 04/23/2020   Polyneuropathy in other diseases classified elsewhere (HCC) 06/19/2021   Chronic diastolic heart failure (HCC) 06/19/2021   Liver mass 06/15/2022   Resolved Ambulatory Problems    Diagnosis Date Noted   LOW BACK PAIN SYNDROME 07/02/2007   Memory loss 11/05/2007   ADVERSE DRUG REACTION 11/05/2007   Urinary retention 07/18/2011   UTI (lower urinary tract infection) 07/18/2011   Generalized anxiety disorder 07/20/2011   URI, acute 06/27/2012   Dysphagia 08/21/2012   Unspecified gastritis and gastroduodenitis without mention of hemorrhage 08/23/2012   Hypokalemia 07/20/2013   CN (constipation) 05/15/2014   Fall    Difficulty speaking 12/16/2014   Paroxysmal A-fib (HCC) 12/16/2014   Left-sided weakness 12/16/2014   Atrial fibrillation, unspecified 08/16/2015   Past Medical History:  Diagnosis Date   Adenomatous polyp of colon 2002   Allergic rhinitis    Anxiety    Anxiety and depression    Chronic back pain    Depression    Diabetes (HCC)    Diverticulosis of colon    Dry eye syndrome    GERD (gastroesophageal reflux disease)    H/O hiatal hernia    History of adverse drug reaction    History of recurrent UTIs    Hypertension, benign    Low back pain syndrome    Mitral valve prolapse    Peripheral neuropathy    Physical deconditioning    Sjogren's syndrome (HCC)    Therapeutic opioid-induced constipation (OIC)    Thyroid nodule    Urinary incontinence  SOCIAL HX:  Social History   Tobacco Use   Smoking status: Former   Smokeless tobacco: Never   Tobacco comments:    Quit at age 41   Substance Use Topics   Alcohol use: No     FAMILY HX:  Family History  Problem Relation Age of Onset   Heart disease Father        heart attack   Pneumonia Father    Heart  attack Mother    Hypertension Mother    Colon cancer Sister    Kidney disease Daughter    Asthma Daughter    Arthritis Daughter 91       osteo,   Heart disease Son 44       stage 3 CHF(Diastolic /Systolic)   Throat cancer Brother    Hypertension Maternal Grandmother       ALLERGIES:  Allergies  Allergen Reactions   Banana Nausea And Vomiting   Codeine Nausea Only    unless given with Phenergan   Klonopin [Clonazepam] Other (See Comments)    Causes hallucination    Meperidine Hcl Nausea Only    unless given with Phenergan   Norflex [Orphenadrine Citrate] Nausea Only    Unless given with Phenergan   Oxycodone-Acetaminophen Nausea Only    unless given with phenergan   Propoxyphene Hcl Nausea Only    unless given with phenergan   Zoloft [Sertraline Hcl] Other (See Comments)    Caused lethargy   Doxycycline Other (See Comments)    Unknown reaction   Naproxen Other (See Comments)    Unknown reaction   Penicillins Other (See Comments)    Unknown reaction Has patient had a PCN reaction causing immediate rash, facial/tongue/throat swelling, SOB or lightheadedness with hypotension: Unknown Has patient had a PCN reaction causing severe rash involving mucus membranes or skin necrosis: Unknown Has patient had a PCN reaction that required hospitalization: pt was in the hospital at time of reaction Has patient had a PCN reaction occurring within the last 10 years: Unknown If all of the above answers are "NO", then may proceed with Cephalos   Phenothiazines Other (See Comments)    Unknown reaction   Stelazine Other (See Comments)    Unknown reaction   Sulfamethoxazole-Trimethoprim Other (See Comments)    Unknown reaction   Tolectin [Tolmetin Sodium] Other (See Comments)    Unknown reaction   Tramadol Other (See Comments)    Unknown reaction      PERTINENT MEDICATIONS:  Outpatient Encounter Medications as of 06/30/2022  Medication Sig   acetaminophen (TYLENOL) 500 MG tablet  Take 1,000 mg by mouth 2 (two) times daily.   ALPRAZolam (XANAX) 0.25 MG tablet Take 1 tablet (0.25 mg total) by mouth 2 (two) times daily as needed for anxiety.   antiseptic oral rinse (BIOTENE) LIQD 15 mLs by Mouth Rinse route as needed.   aspirin 325 MG tablet Take 325 mg by mouth daily. Takes at 5pm.   bisacodyl (DULCOLAX) 5 MG EC tablet Take 1 tablet (5 mg total) by mouth daily as needed for moderate constipation.   Calcium Carb-Cholecalciferol (CALCIUM 600+D) 600-20 MG-MCG TABS Take 600 mcg by mouth daily.   carboxymethylcellulose (REFRESH PLUS) 0.5 % SOLN Place 1 drop into both eyes in the morning, at noon, in the evening, and at bedtime.   CRANBERRY PO Take 2 tablets by mouth daily. AZO   diltiazem (CARDIZEM CD) 180 MG 24 hr capsule Take 1 capsule by mouth once daily   DULoxetine (CYMBALTA) 30 MG  capsule Take 1 capsule (30 mg total) by mouth daily. Takes at 9am   DULoxetine (CYMBALTA) 60 MG capsule Take 1 capsule (60 mg total) by mouth daily. Takes at 5pm.   furosemide (LASIX) 40 MG tablet Take 1 tablet by mouth once daily   glucose blood (ONETOUCH VERIO) test strip USE 1 STRIP TO CHECK GLUCOSE ONCE DAILY   Lancets Micro Thin 33G MISC Use to test blood sugar daily. Dx: E08.65   lansoprazole (PREVACID) 30 MG capsule TAKE 1 CAPSULE BY MOUTH ONCE DAILY AT NOON (Patient taking differently: Take 30 mg by mouth daily. TAKE 1 CAPSULE BY MOUTH ONCE DAILY AT NOON)   LINZESS 290 MCG CAPS capsule TAKE 1 CAPSULE BY MOUTH EVERY DAY AT BEDTIME (Patient taking differently: Take 290 mcg by mouth at bedtime.)   loratadine (CLARITIN) 10 MG tablet Take 1 tablet (10 mg total) by mouth daily.   memantine (NAMENDA) 10 MG tablet Take 1 tablet by mouth twice daily   MYRBETRIQ 50 MG TB24 tablet Take 1 tablet by mouth once daily   oxyCODONE (OXY IR/ROXICODONE) 5 MG immediate release tablet Take 1 tablet (5 mg total) by mouth every 12 (twelve) hours as needed for moderate pain, breakthrough pain or severe pain.    pilocarpine (SALAGEN) 5 MG tablet Take 1 tablet by mouth twice daily   polyethylene glycol (MIRALAX / GLYCOLAX) 17 g packet Take 17 g by mouth daily. Mix with 8 oz. water or juice   potassium chloride SA (KLOR-CON M) 20 MEQ tablet TAKE 1  BY MOUTH ONCE DAILY (Patient taking differently: Take 20 mEq by mouth daily.)   pregabalin (LYRICA) 100 MG capsule Take 1 capsule (100 mg total) by mouth 2 (two) times daily.   saccharomyces boulardii (FLORASTOR) 250 MG capsule Take 1 capsule (250 mg total) by mouth daily.   senna-docusate (SENOKOT-S) 8.6-50 MG tablet Take 1 tablet by mouth 2 (two) times daily.   sitaGLIPtin (JANUVIA) 100 MG tablet Take 1 tablet by mouth once daily   No facility-administered encounter medications on file as of 06/30/2022.     Thank you for the opportunity to participate in the care of Ms. Alinda Money.  The palliative care team will continue to follow. Please call our office at [phone] if we can be of additional assistance.   Note: Portions of this note were generated with Scientist, clinical (histocompatibility and immunogenetics). Dictation errors may occur despite best attempts at proofreading.  Rosaura Carpenter, NP

## 2022-07-03 DIAGNOSIS — K7469 Other cirrhosis of liver: Secondary | ICD-10-CM | POA: Diagnosis not present

## 2022-07-03 DIAGNOSIS — C228 Malignant neoplasm of liver, primary, unspecified as to type: Secondary | ICD-10-CM | POA: Diagnosis not present

## 2022-07-03 DIAGNOSIS — E871 Hypo-osmolality and hyponatremia: Secondary | ICD-10-CM | POA: Diagnosis not present

## 2022-07-03 DIAGNOSIS — R2689 Other abnormalities of gait and mobility: Secondary | ICD-10-CM | POA: Diagnosis not present

## 2022-07-03 DIAGNOSIS — I10 Essential (primary) hypertension: Secondary | ICD-10-CM | POA: Diagnosis not present

## 2022-07-03 DIAGNOSIS — R2681 Unsteadiness on feet: Secondary | ICD-10-CM | POA: Diagnosis not present

## 2022-07-03 DIAGNOSIS — I482 Chronic atrial fibrillation, unspecified: Secondary | ICD-10-CM | POA: Diagnosis not present

## 2022-07-03 DIAGNOSIS — F01511 Vascular dementia, unspecified severity, with agitation: Secondary | ICD-10-CM | POA: Diagnosis not present

## 2022-07-03 DIAGNOSIS — M6281 Muscle weakness (generalized): Secondary | ICD-10-CM | POA: Diagnosis not present

## 2022-07-07 ENCOUNTER — Encounter: Payer: Self-pay | Admitting: Family

## 2022-07-07 ENCOUNTER — Other Ambulatory Visit: Payer: Self-pay | Admitting: Family

## 2022-07-07 ENCOUNTER — Ambulatory Visit (INDEPENDENT_AMBULATORY_CARE_PROVIDER_SITE_OTHER): Payer: Medicare Other | Admitting: Family

## 2022-07-07 ENCOUNTER — Telehealth: Payer: Self-pay

## 2022-07-07 VITALS — BP 138/80 | HR 67 | Temp 97.9°F | Resp 16 | Ht 63.0 in | Wt 131.0 lb

## 2022-07-07 DIAGNOSIS — R220 Localized swelling, mass and lump, head: Secondary | ICD-10-CM

## 2022-07-07 DIAGNOSIS — R1010 Upper abdominal pain, unspecified: Secondary | ICD-10-CM

## 2022-07-07 DIAGNOSIS — R0981 Nasal congestion: Secondary | ICD-10-CM

## 2022-07-07 DIAGNOSIS — E1140 Type 2 diabetes mellitus with diabetic neuropathy, unspecified: Secondary | ICD-10-CM | POA: Diagnosis not present

## 2022-07-07 DIAGNOSIS — K2100 Gastro-esophageal reflux disease with esophagitis, without bleeding: Secondary | ICD-10-CM

## 2022-07-07 DIAGNOSIS — R350 Frequency of micturition: Secondary | ICD-10-CM

## 2022-07-07 DIAGNOSIS — E785 Hyperlipidemia, unspecified: Secondary | ICD-10-CM | POA: Diagnosis not present

## 2022-07-07 DIAGNOSIS — I480 Paroxysmal atrial fibrillation: Secondary | ICD-10-CM | POA: Diagnosis not present

## 2022-07-07 DIAGNOSIS — I10 Essential (primary) hypertension: Secondary | ICD-10-CM | POA: Diagnosis not present

## 2022-07-07 DIAGNOSIS — I5032 Chronic diastolic (congestive) heart failure: Secondary | ICD-10-CM

## 2022-07-07 LAB — POCT URINALYSIS DIPSTICK
Bilirubin, UA: NEGATIVE
Blood, UA: NEGATIVE
Glucose, UA: NEGATIVE
Ketones, UA: NEGATIVE
Nitrite, UA: POSITIVE
Protein, UA: POSITIVE — AB
Spec Grav, UA: 1.01 (ref 1.010–1.025)
Urobilinogen, UA: 0.2 E.U./dL
pH, UA: 7.5 (ref 5.0–8.0)

## 2022-07-07 LAB — CBC WITH DIFFERENTIAL/PLATELET
Hemoglobin: 11.8 g/dL (ref 11.7–15.5)
Total Lymphocyte: 29.5 %
WBC: 4.9 10*3/uL (ref 3.8–10.8)

## 2022-07-07 MED ORDER — OXYCODONE HCL 5 MG PO TABS
5.0000 mg | ORAL_TABLET | Freq: Two times a day (BID) | ORAL | 0 refills | Status: DC | PRN
Start: 2022-07-07 — End: 2022-08-15

## 2022-07-07 MED ORDER — OXYCODONE HCL 5 MG PO TABS
5.0000 mg | ORAL_TABLET | Freq: Two times a day (BID) | ORAL | 0 refills | Status: DC | PRN
Start: 2022-07-07 — End: 2022-07-07

## 2022-07-07 NOTE — Telephone Encounter (Signed)
Ariana [name], Home care nurse, with [facility] home health called to inform our office that there has been a delay in the start of care due to patient's caregiver wanting to delay start of care until next week.  Message sent to Richarda Blade, NP [facility])

## 2022-07-07 NOTE — Telephone Encounter (Signed)
Noted  

## 2022-07-07 NOTE — Progress Notes (Unsigned)
Provider: Richarda Blade FNP-C  Demetrious Rainford, Donalee Citrin, NP  Patient Care Team: Lanasia Porras, Donalee Citrin, NP as PCP - General (Family Medicine) Chrystie Nose, MD as PCP - Cardiology (Cardiology) [name], MD as Consulting Physician (Pulmonary Disease) Freddy Finner, MD (Inactive) as Consulting Physician (Obstetrics and Gynecology) Chrystie Nose, MD as Consulting Physician (Cardiology) Mateo Flow, MD as Consulting Physician (Ophthalmology) York Spaniel, MD (Inactive) as Consulting Physician (Neurology) Marcine Matar, MD as Consulting Physician (Urology) Janalyn Harder, MD (Inactive) as Consulting Physician (Dermatology) Iva Boop, MD as Consulting Physician (Gastroenterology) Serita Grit as Social Worker Octavia Heir, NP as Nurse Practitioner (Adult Health Nurse Practitioner)  Extended Emergency Contact Information Primary Emergency Contact: Molinda Bailiff Address: [address]          [place], [place] [address] [place] Phone: [phone] Relation: Daughter Secondary Emergency Contact: Candi Leash [place] of [place] Mobile Phone: [phone] Relation: Other  Code Status:  Full Code  Goals of care: Advanced Directive information    07/07/2022    1:06 PM  Advanced Directives  Does Patient Have a Medical Advance Directive? No  Would patient like information on creating a medical advance directive? No - Patient declined     Chief Complaint  Patient presents with   Acute Visit    Follow up from rehab. Patient states she did not receive medication    HPI:  Pt is a 89 y.o. female seen today for an acute visit for follow up post Rehabilitation from [facility] facility discharge on 07/03/2022.she was admitted in SNF for rehab post hospitalization at [facility] on 06/15/2022  - 06/19/2022 for liver mass.  States continues to have pain on right abdomen request oxycodone 5 mg  tablet refilled states was not send home with oxycodone prescription from [facility]. PDMP reviewed no refill noted.Advised to sign narcotic use contract this visit.  She complains of right nare small swelling x 1 month.states feels like a node.No bleed but feels congested.    Past Medical History:  Diagnosis Date   Abnormality of gait    Adenomatous polyp of colon 2002   7mm   Allergic rhinitis    Anxiety    Anxiety and depression    Chronic back pain    Dementia without behavioral disturbance (HCC)    Depression    Diabetes (HCC)    Diverticulosis of colon    Dry eye syndrome    Dysphagia    Dysthymic disorder    Fall    Fibromyalgia    GERD (gastroesophageal reflux disease)    H/O hiatal hernia    History of adverse drug reaction    History of cerebrovascular disease 09/24/2014   History of recurrent UTIs    Hypertension, benign    Irritable bowel syndrome    Low back pain syndrome    Memory loss    Mitral valve prolapse    Paroxysmal A-fib (HCC)    Peripheral neuropathy    "both feet and legs"   Physical deconditioning    Sjogren's syndrome (HCC)    Therapeutic opioid-induced constipation (OIC)    Thyroid nodule    Urinary incontinence    Past Surgical History:  Procedure Laterality Date   ABDOMINAL HYSTERECTOMY  1967   APPENDECTOMY     CARDIAC CATHETERIZATION  02/17/2003   normal L main, LAD free of disease, Cfx free of disease, RCA free of disease (Dr.  A. Little)   CATARACT EXTRACTION, BILATERAL     CHOLECYSTECTOMY  2000   COLONOSCOPY W/ BIOPSIES     multiple   DENTAL SURGERY     multiple tooth extractions   ESOPHAGOGASTRODUODENOSCOPY (EGD) WITH ESOPHAGEAL DILATION N/A 08/23/2012   Procedure: ESOPHAGOGASTRODUODENOSCOPY (EGD) WITH ESOPHAGEAL DILATION;  Surgeon: Rachael Fee, MD;  Location: [facility];  Service: Endoscopy;  Laterality: N/A;   NASAL SEPTUM SURGERY  1980   NM MYOCAR PERF WALL MOTION  2003   persantine - normal static and dynamic study  w/apical thinning and presvered LV function, no ischemia   SINUS EXPLORATION     ossifiying fibroma   TEMPOROMANDIBULAR JOINT SURGERY  1986   Dr. Warren Danes   TRANSTHORACIC ECHOCARDIOGRAM  2001   mild LVH, normal LV    Allergies  Allergen Reactions   Banana Nausea And Vomiting   Codeine Nausea Only    unless given with Phenergan   Klonopin [Clonazepam] Other (See Comments)    Causes hallucination    Meperidine Hcl Nausea Only    unless given with Phenergan   Norflex [Orphenadrine Citrate] Nausea Only    Unless given with Phenergan   Oxycodone-Acetaminophen Nausea Only    unless given with phenergan   Propoxyphene Hcl Nausea Only    unless given with phenergan   Zoloft [Sertraline Hcl] Other (See Comments)    Caused lethargy   Doxycycline Other (See Comments)    Unknown reaction   Naproxen Other (See Comments)    Unknown reaction   Penicillins Other (See Comments)    Unknown reaction Has patient had a PCN reaction causing immediate rash, facial/tongue/throat swelling, SOB or lightheadedness with hypotension: Unknown Has patient had a PCN reaction causing severe rash involving mucus membranes or skin necrosis: Unknown Has patient had a PCN reaction that required hospitalization: pt was in the hospital at time of reaction Has patient had a PCN reaction occurring within the last 10 years: Unknown If all of the above answers are "NO", then may proceed with Cephalos   Phenothiazines Other (See Comments)    Unknown reaction   Stelazine Other (See Comments)    Unknown reaction   Sulfamethoxazole-Trimethoprim Other (See Comments)    Unknown reaction   Tolectin [Tolmetin Sodium] Other (See Comments)    Unknown reaction   Tramadol Other (See Comments)    Unknown reaction    Outpatient Encounter Medications as of 07/07/2022  Medication Sig   acetaminophen (TYLENOL) 500 MG tablet Take 1,000 mg by mouth 2 (two) times daily.   ALPRAZolam (XANAX) 0.25 MG tablet Take 1 tablet (0.25  mg total) by mouth 2 (two) times daily as needed for anxiety.   antiseptic oral rinse (BIOTENE) LIQD 15 mLs by Mouth Rinse route as needed.   aspirin 325 MG tablet Take 325 mg by mouth daily. Takes at 5pm.   bisacodyl (DULCOLAX) 5 MG EC tablet Take 1 tablet (5 mg total) by mouth daily as needed for moderate constipation.   Calcium Carb-Cholecalciferol (CALCIUM 600+D) 600-20 MG-MCG TABS Take 600 mcg by mouth daily.   carboxymethylcellulose (REFRESH PLUS) 0.5 % SOLN Place 1 drop into both eyes in the morning, at noon, in the evening, and at bedtime.   CRANBERRY PO Take 2 tablets by mouth daily. AZO   diltiazem (CARDIZEM CD) 180 MG 24 hr capsule Take 1 capsule by mouth once daily   DULoxetine (CYMBALTA) 30 MG capsule Take 1 capsule (30 mg total) by mouth daily. Takes at 9am   DULoxetine (CYMBALTA)  60 MG capsule Take 1 capsule (60 mg total) by mouth daily. Takes at 5pm.   furosemide (LASIX) 40 MG tablet Take 1 tablet by mouth once daily   glucose blood (ONETOUCH VERIO) test strip USE 1 STRIP TO CHECK GLUCOSE ONCE DAILY   Lancets Micro Thin 33G MISC Use to test blood sugar daily. Dx: E08.65   lansoprazole (PREVACID) 30 MG capsule TAKE 1 CAPSULE BY MOUTH ONCE DAILY AT NOON (Patient taking differently: Take 30 mg by mouth daily. TAKE 1 CAPSULE BY MOUTH ONCE DAILY AT NOON)   LINZESS 290 MCG CAPS capsule TAKE 1 CAPSULE BY MOUTH EVERY DAY AT BEDTIME (Patient taking differently: Take 290 mcg by mouth at bedtime.)   loratadine (CLARITIN) 10 MG tablet Take 1 tablet (10 mg total) by mouth daily.   memantine (NAMENDA) 10 MG tablet Take 1 tablet by mouth twice daily   MYRBETRIQ 50 MG TB24 tablet Take 1 tablet by mouth once daily   oxyCODONE (OXY IR/ROXICODONE) 5 MG immediate release tablet Take 1 tablet (5 mg total) by mouth every 12 (twelve) hours as needed for moderate pain, breakthrough pain or severe pain.   pilocarpine (SALAGEN) 5 MG tablet Take 1 tablet by mouth twice daily   polyethylene glycol (MIRALAX /  GLYCOLAX) 17 g packet Take 17 g by mouth daily. Mix with 8 oz. water or juice   potassium chloride SA (KLOR-CON M) 20 MEQ tablet TAKE 1  BY MOUTH ONCE DAILY (Patient taking differently: Take 20 mEq by mouth daily.)   pregabalin (LYRICA) 100 MG capsule Take 1 capsule (100 mg total) by mouth 2 (two) times daily.   saccharomyces boulardii (FLORASTOR) 250 MG capsule Take 1 capsule (250 mg total) by mouth daily.   senna-docusate (SENOKOT-S) 8.6-50 MG tablet Take 1 tablet by mouth 2 (two) times daily.   sitaGLIPtin (JANUVIA) 100 MG tablet Take 1 tablet by mouth once daily   No facility-administered encounter medications on file as of 07/07/2022.    Review of Systems  Constitutional:  Negative for appetite change, chills, fatigue, fever and unexpected weight change.  HENT:  Positive for congestion. Negative for dental problem, ear discharge, ear pain, hearing loss, nosebleeds, postnasal drip, rhinorrhea, sinus pressure, sinus pain, sneezing, sore throat, tinnitus and trouble swallowing.        Reports small swelling in the nose x 1 month   Eyes:  Negative for pain, discharge, redness, itching and visual disturbance.  Respiratory:  Negative for cough, chest tightness, shortness of breath and wheezing.   Cardiovascular:  Negative for chest pain, palpitations and leg swelling.  Gastrointestinal:  Positive for abdominal pain. Negative for abdominal distention, blood in stool, constipation, diarrhea, nausea and vomiting.       Right abdomen   Endocrine: Negative for cold intolerance, heat intolerance, polydipsia, polyphagia and polyuria.  Genitourinary:  Negative for difficulty urinating, dysuria, flank pain, frequency and urgency.  Musculoskeletal:  Positive for gait problem. Negative for arthralgias, back pain, joint swelling, myalgias, neck pain and neck stiffness.  Skin:  Negative for color change, pallor, rash and wound.  Neurological:  Negative for dizziness, syncope, speech difficulty, weakness,  light-headedness, numbness and headaches.  Hematological:  Does not bruise/bleed easily.  Psychiatric/Behavioral:  Negative for agitation, behavioral problems, confusion, hallucinations and sleep disturbance. The patient is not nervous/anxious.     Immunization History  Administered Date(s) Administered   Fluad Quad(high Dose 65+) 11/08/2018, 11/24/2019, 02/03/2022   Influenza Split 12/26/2010, 01/15/2012   Influenza Whole 12/30/2008, 11/15/2009   Influenza,  High Dose Seasonal PF 12/21/2016, 11/05/2017   Influenza,inj,Quad PF,6+ Mos 02/04/2013, 11/03/2013, 12/05/2014, 12/13/2015   Moderna Sars-Covid-2 Vaccination 04/17/2019, 05/15/2019, 12/15/2019   Pneumococcal Conjugate-13 06/12/2014   Pneumococcal Polysaccharide-23 02/15/2009, 12/05/2014   Tdap 12/08/2013   Zoster, Live 01/29/2012   Pertinent  Health Maintenance Due  Topic Date Due   OPHTHALMOLOGY EXAM  Never done   FOOT EXAM  01/28/2020   HEMOGLOBIN A1C  08/05/2022   INFLUENZA VACCINE  09/14/2022   DEXA SCAN  Completed      05/20/2021    1:38 PM 06/17/2021    2:27 PM 12/09/2021    3:34 PM 02/03/2022    1:44 PM 07/07/2022    1:06 PM  Fall Risk  Falls in the past year?  0  1 1  Was there an injury with Fall?  0  1 1  Fall Risk Category Calculator  0  3 3  Fall Risk Category (Retired)  Low  High   (RETIRED) Patient Fall Risk Level Low fall risk Low fall risk Moderate fall risk High fall risk   Patient at Risk for Falls Due to  No Fall Risks  History of fall(s);Impaired balance/gait;Impaired mobility History of fall(s);Impaired balance/gait  Fall risk Follow up  Falls evaluation completed  Falls evaluation completed;Education provided;Falls prevention discussed Falls evaluation completed   Functional Status Survey:    Vitals:   07/07/22 1257  BP: 138/80  Pulse: 67  Resp: 16  Temp: 97.9 F (36.6 C)  TempSrc: Temporal  SpO2: 96%  Weight: 131 lb (59.4 kg)  Height: 5\' 3"  (1.6 m)   Body mass index is 23.21  kg/m. Physical Exam Vitals reviewed.  Constitutional:      General: She is not in acute distress.    Appearance: Normal appearance. She is normal weight. She is not ill-appearing or diaphoretic.  HENT:     Head: Normocephalic.     Right Ear: Tympanic membrane, ear canal and external ear normal. There is no impacted cerumen.     Left Ear: Tympanic membrane, ear canal and external ear normal. There is no impacted cerumen.     Nose: Nose normal. No congestion or rhinorrhea.     Comments: Swelling in the nose not visualized.    Mouth/Throat:     Mouth: Mucous membranes are moist.     Pharynx: Oropharynx is clear. No oropharyngeal exudate or posterior oropharyngeal erythema.  Eyes:     General: No scleral icterus.       Right eye: No discharge.        Left eye: No discharge.     Extraocular Movements: Extraocular movements intact.     Conjunctiva/sclera: Conjunctivae normal.     Pupils: Pupils are equal, round, and reactive to light.  Neck:     Vascular: No carotid bruit.  Cardiovascular:     Rate and Rhythm: Normal rate and regular rhythm.     Pulses: Normal pulses.     Heart sounds: Normal heart sounds. No murmur heard.    No friction rub. No gallop.  Pulmonary:     Effort: Pulmonary effort is normal. No respiratory distress.     Breath sounds: Normal breath sounds. No wheezing, rhonchi or rales.  Chest:     Chest wall: No tenderness.  Abdominal:     General: Bowel sounds are normal. There is distension.     Palpations: Abdomen is soft. There is no mass.     Tenderness: There is no abdominal tenderness. There is no right  CVA tenderness, left CVA tenderness, guarding or rebound.     Comments: Right abdomen   Musculoskeletal:        General: No swelling or tenderness. Normal range of motion.     Cervical back: Normal range of motion. No rigidity or tenderness.     Right lower leg: No edema.     Left lower leg: No edema.  Lymphadenopathy:     Cervical: No cervical adenopathy.   Skin:    General: Skin is warm and dry.     Coloration: Skin is not pale.     Findings: No bruising, erythema, lesion or rash.  Neurological:     Mental Status: She is alert and oriented to person, place, and time.     Cranial Nerves: No cranial nerve deficit.     Sensory: No sensory deficit.     Motor: No weakness.     Coordination: Coordination normal.     Gait: Gait abnormal.  Psychiatric:        Mood and Affect: Mood normal.        Speech: Speech normal.        Behavior: Behavior normal.        Thought Content: Thought content normal.        Judgment: Judgment normal.     Labs reviewed: Recent Labs    02/03/22 1419 06/15/22 1314 06/16/22 0429  NA 138 133* 137  K 4.3 3.9 3.5  CL 98 100 103  CO2 31 25 27   GLUCOSE 86 120* 94  BUN 18 19 15   CREATININE 0.75 0.79 0.70  CALCIUM 10.0 9.8 9.5  MG  --   --  2.3  PHOS  --   --  3.5   Recent Labs    06/15/22 1314 06/16/22 0429  AST 41 38  ALT 52* 50*  ALKPHOS 126 120  BILITOT 0.7 0.5  PROT 7.4 6.5  ALBUMIN 3.9 3.5   Recent Labs    02/03/22 1419 06/15/22 1314 06/16/22 0429  WBC 5.8 5.5 6.1  NEUTROABS 3,619  --   --   HGB 12.8 12.0 11.7*  HCT 38.3 37.1 35.5*  MCV 94.3 95.4 94.2  PLT 189 216 193   Lab Results  Component Value Date   TSH 2.112 06/16/2022   Lab Results  Component Value Date   HGBA1C 6.2 (H) 02/03/2022   Lab Results  Component Value Date   CHOL 160 02/03/2022   HDL 48 (L) 02/03/2022   LDLCALC 82 02/03/2022   LDLDIRECT 65.7 07/08/2010   TRIG 201 (H) 02/03/2022   CHOLHDL 3.3 02/03/2022    Significant Diagnostic Results in last 30 days:  ECHOCARDIOGRAM COMPLETE  Result Date: 06/16/2022    ECHOCARDIOGRAM REPORT   Patient Name:   DANYETTA RIZZUTI Date of Exam: 06/16/2022 Medical Rec #:  [id number]      Height:       63.0 in Accession #:    [id number]     Weight:       138.0 lb Date of Birth:  [birth year]      BSA:          1.652 m Patient Age:    89 years       BP:           145/70 mmHg  Patient Gender: F              HR:           108 bpm. Exam Location:  Inpatient  Procedure: 2D Echo Indications:    atrial fibrillation  History:        Patient has prior history of Echocardiogram examinations, most                 recent 04/25/2017. Arrythmias:Atrial Fibrillation; Risk                 Factors:Dyslipidemia.  Sonographer:    Delcie Roch RDCS Referring Phys: [id number] ANAND D HONGALGI IMPRESSIONS  1. Left ventricular ejection fraction, by estimation, is 60 to 65%. The left ventricle has normal function. The left ventricle has no regional wall motion abnormalities. There is moderate concentric left ventricular hypertrophy. Left ventricular diastolic function could not be evaluated.  2. Right ventricular systolic function is normal. The right ventricular size is normal. Tricuspid regurgitation signal is inadequate for assessing PA pressure.  3. The mitral valve is normal in structure. Trivial mitral valve regurgitation. No evidence of mitral stenosis.  4. The aortic valve is tricuspid. Aortic valve regurgitation is not visualized. No aortic stenosis is present.  5. The inferior vena cava is normal in size with greater than 50% respiratory variability, suggesting right atrial pressure of 3 mmHg. FINDINGS  Left Ventricle: Left ventricular ejection fraction, by estimation, is 60 to 65%. The left ventricle has normal function. The left ventricle has no regional wall motion abnormalities. The left ventricular internal cavity size was normal in size. There is  moderate concentric left ventricular hypertrophy. Left ventricular diastolic function could not be evaluated due to atrial fibrillation. Left ventricular diastolic function could not be evaluated. Right Ventricle: The right ventricular size is normal. Right ventricular systolic function is normal. Tricuspid regurgitation signal is inadequate for assessing PA pressure. The tricuspid regurgitant velocity is 2.24 m/s, and with an assumed right atrial   pressure of 3 mmHg, the estimated right ventricular systolic pressure is 23.1 mmHg. Left Atrium: Left atrial size was normal in size. Right Atrium: Right atrial size was normal in size. Pericardium: Trivial pericardial effusion is present. Mitral Valve: The mitral valve is normal in structure. Mild mitral annular calcification. Trivial mitral valve regurgitation. No evidence of mitral valve stenosis. Tricuspid Valve: The tricuspid valve is normal in structure. Tricuspid valve regurgitation is mild . No evidence of tricuspid stenosis. Aortic Valve: The aortic valve is tricuspid. Aortic valve regurgitation is not visualized. No aortic stenosis is present. Pulmonic Valve: The pulmonic valve was normal in structure. Pulmonic valve regurgitation is not visualized. No evidence of pulmonic stenosis. Aorta: The aortic root is normal in size and structure. Venous: The inferior vena cava is normal in size with greater than 50% respiratory variability, suggesting right atrial pressure of 3 mmHg. IAS/Shunts: No atrial level shunt detected by color flow Doppler.  LEFT VENTRICLE PLAX 2D LVIDd:         3.90 cm   Diastology LVIDs:         2.60 cm   LV e' medial: 7.18 cm/s LV PW:         1.00 cm LV IVS:        1.00 cm LVOT diam:     2.10 cm LV SV:         30 LV SV Index:   18 LVOT Area:     3.46 cm  RIGHT VENTRICLE             IVC RV Basal diam:  2.30 cm     IVC diam: 1.40 cm RV S prime:     12.30 cm/s  TAPSE (M-mode): 1.7 cm LEFT ATRIUM             Index        RIGHT ATRIUM           Index LA diam:        3.70 cm 2.24 cm/m   RA Area:     10.40 cm LA Vol (A2C):   48.7 ml 29.49 ml/m  RA Volume:   23.10 ml  13.99 ml/m LA Vol (A4C):   39.4 ml 23.86 ml/m LA Biplane Vol: 43.9 ml 26.58 ml/m  AORTIC VALVE LVOT Vmax:   63.60 cm/s LVOT Vmean:  39.900 cm/s LVOT VTI:    0.087 m  AORTA Ao Root diam: 3.20 cm Ao Asc diam:  3.10 cm TRICUSPID VALVE TR Peak grad:   20.1 mmHg TR Vmax:        224.00 cm/s  SHUNTS Systemic VTI:  0.09 m Systemic  Diam: 2.10 cm Olga Millers MD Electronically signed by Olga Millers MD Signature Date/Time: 06/16/2022/2:10:47 PM    Final    MR LIVER W WO CONTRAST  Result Date: 06/16/2022 CLINICAL DATA:  89 year old female with suspected hepatocellular carcinoma. EXAM: MRI ABDOMEN WITHOUT AND WITH CONTRAST TECHNIQUE: Multiplanar multisequence MR imaging of the abdomen was performed both before and after the administration of intravenous contrast. CONTRAST:  6mL GADAVIST GADOBUTROL 1 MMOL/ML IV SOLN COMPARISON:  Abdominal MRI 03/31/2019. CT of the abdomen and pelvis 06/15/2022. FINDINGS: Lower chest: Unremarkable. Hepatobiliary: Liver has a slightly shrunken appearance and nodular contour, suggesting underlying cirrhosis. Again noted is a large mass in the inferior aspect of the right lobe of the liver involving portions of segments 5 and 6 (axial image 118 of series 902 and coronal image 64 of series 10) estimated to measure 9.6 x 9.6 x 9.4 cm. This lesion is heterogeneous in signal intensity on T1 and T2 weighted images, and demonstrates heterogeneous enhancement on post gadolinium images with some areas that are hypervascular and other areas that appear hypovascular during arterial phase imaging. There does appear to be a persistent area of peripheral enhancement on delayed post gadolinium imaging. Small internal areas of T1 hyperintensity are noted on precontrast imaging (axial image 133 of series 900), indicative of some internal areas of hemorrhage. No other new hepatic lesions are noted. No intrahepatic biliary ductal dilatation. Common bile duct is mildly dilated measuring 9 mm in the porta hepatis, which is within normal limits given the patient's age. No filling defects in the common bile duct to suggest choledocholithiasis. Status post cholecystectomy. Pancreas: No pancreatic mass. No pancreatic ductal dilatation. No pancreatic or peripancreatic fluid collections or inflammatory changes. Spleen:  Unremarkable.  Adrenals/Urinary Tract: Right kidney and bilateral adrenal glands are normal in appearance. Multiple small lesions are noted throughout the left kidney ranging from T1 hypointense and T2 hyperintense with no enhancement, to T1 hyperintense and T2 hypointense with no enhancement, compatible with a combination of Bosniak class 1 and Bosniak class 2 cysts (benign requiring no future imaging follow-up). No hydroureteronephrosis in the visualized portions of the abdomen. Stomach/Bowel: Visualized portions are unremarkable. Vascular/Lymphatic: No aneurysm identified in the visualized abdominal vasculature. No lymphadenopathy noted in the abdomen. Other: No significant volume of ascites noted in the visualized portions of the peritoneal cavity. Musculoskeletal: No aggressive appearing osseous lesions are noted in the visualized portions of the skeleton. Cavernous hemangioma in the L3 vertebral body incidentally noted. IMPRESSION: 1. Cirrhotic liver with large enlarging aggressive appearing neoplasm in the inferior aspect of the  right lobe of the liver which has imaging characteristics (size, enhancing capsule and threshold growth) categorized as LR-5 definitive hepatocellular carcinoma. Correlation with pending AFP levels is recommended. 2. Additional incidental findings, as above. Electronically Signed   By: Trudie Reed M.D.   On: 06/16/2022 08:55   CT ABDOMEN PELVIS W CONTRAST  Result Date: 06/15/2022 CLINICAL DATA:  Right lower quadrant abdominal pain. Known right inferior hepatic mass. EXAM: CT ABDOMEN AND PELVIS WITH CONTRAST TECHNIQUE: Multidetector CT imaging of the abdomen and pelvis was performed using the standard protocol following bolus administration of intravenous contrast. RADIATION DOSE REDUCTION: This exam was performed according to the departmental dose-optimization program which includes automated exposure control, adjustment of the mA and/or kV according to patient size and/or use of iterative  reconstruction technique. CONTRAST:  65mL OMNIPAQUE IOHEXOL 350 MG/ML SOLN COMPARISON:  02/14/2021 FINDINGS: Lower chest: No acute abnormality. Hepatobiliary: Enlargement rounded shape mass within the inferior tip of the right lobe of the liver causing increased capsular distension and now measuring roughly 9.9 x 9.4 x 9.2 cm. The mass demonstrates heterogeneous enhancement with areas of focal internal necrosis. Due to the mass, the inferior extent of the right lobe of the liver extends into the upper right pelvis. No evidence by CT of spontaneous rupture or bleed of the mass. The rest of the liver is unremarkable without evidence of biliary dilatation. The gallbladder has been removed. Pancreas: Stable atrophic pancreas. Spleen: Normal in size without focal abnormality. Adrenals/Urinary Tract: Adrenal glands are unremarkable. No hydronephrosis. No calculi or solid renal masses. Bladder is unremarkable. Stomach/Bowel: Bowel shows no evidence of obstruction, ileus, inflammation or lesion. The appendix is absent. No free intraperitoneal air. Stable diverticulosis of the sigmoid colon without evidence of diverticulitis. Vascular/Lymphatic: No significant vascular findings are present. No enlarged abdominal or pelvic lymph nodes. Reproductive: Status post hysterectomy. No adnexal masses. Other: Stable small left inguinal hernia containing fat. No abscess or ascites. Musculoskeletal: Stable degenerative disc disease at L4-5 and L5-S1. Osteopenia without lumbar or lower thoracic compression fractures. IMPRESSION: 1. Enlargement of rounded shape mass within the inferior tip of the right lobe of the liver causing increased capsular distension and now measuring roughly 9.9 x 9.4 x 9.2 cm. The mass demonstrates heterogeneous enhancement with areas of focal internal necrosis. No evidence by CT of spontaneous rupture or bleed of the mass. The increased capsular distension of the liver is likely the cause of right lower  abdominal pain. Based on appearance, this mass most likely represents a hepatocellular carcinoma. 2. Stable diverticulosis of the sigmoid colon without evidence of diverticulitis. 3. Stable small left inguinal hernia containing fat. 4. Osteopenia and degenerative disc disease at L4-5 and L5-S1. Electronically Signed   By: Irish Lack M.D.   On: 06/15/2022 17:22    Assessment/Plan 1. Essential hypertension Blood pressure stable -Continue on diltiazem and furosemide -On aspirin for cardiovascular event prevention - Basic metabolic panel - CBC with Differential/Platelet  2. Controlled type 2 diabetes mellitus with diabetic neuropathy, without long-term current use of insulin (HCC) Lab Results  Component Value Date   HGBA1C 6.2 (H) 02/03/2022  -No home CBG for review -Continue on Januvia -Continue dietary modification and exercise as tolerated - CBC with Differential/Platelet  3. PAF (paroxysmal atrial fibrillation) (HCC) Rate controlled -Continue on diltiazem and aspirin - Basic metabolic panel  4. Hyperlipidemia LDL goal <100 LDL at goal Continue dietary modification  5. Gastroesophageal reflux disease with esophagitis without hemorrhage Symptoms controlled. H/H stable.No tarry or black stool  -  advised to avoid eating meals late in the evening and to avoid aggravating foods and spices. - continue to monitor   6. Chronic diastolic heart failure (HCC) Symptoms of fluid overload -Continue to monitor weight -Continue on furosemide  7. Urine frequency Afebrile - Urine Culture - POCT urinalysis dipstick indicates yellow cloudy urine, positive for proteins, nitrites and moderate leukocytes.Results discussed with patient and caregiver will await final urine culture  8. Pain of upper abdomen Will refill oxycodone patient discharged home without analgesic from [facility]. - PDMP reviewed no recent refills noted. - Narcotic use contract signed this visit - side effects  reviewed  - oxyCODONE (OXY IR/ROXICODONE) 5 MG immediate release tablet; Take 1 tablet (5 mg total) by mouth every 12 (twelve) hours as needed for moderate pain, breakthrough pain or severe pain.  Dispense: 60 tablet; Refill: 0  9. Nasal congestion Reports nasal congestion  Advised to use Flonase daily   10. Swelling of nose Swelling in the nose not visualized. Will refer to ENT for further evaluation    Family/ staff Communication: Reviewed plan of care with patient and caregiver verbalized understanding  Labs/tests ordered:  - CBC with Differential/Platelet - CMP with eGFR([facility])l -- POC Urinalysis Dipstick  - Urine Culture  Next Appointment: Return if symptoms worsen or fail to improve.   Caesar Bookman, NP

## 2022-07-07 NOTE — Telephone Encounter (Signed)
Duplicated Rx. Pended and sent to Dinah to refuse.

## 2022-07-08 LAB — CBC WITH DIFFERENTIAL/PLATELET
HCT: 35.9 % (ref 35.0–45.0)
MCH: 30.5 pg (ref 27.0–33.0)
MCHC: 32.9 g/dL (ref 32.0–36.0)
Platelets: 206 10*3/uL (ref 140–400)

## 2022-07-08 LAB — BASIC METABOLIC PANEL
BUN: 22 mg/dL (ref 7–25)
CO2: 32 mmol/L (ref 20–32)

## 2022-07-09 LAB — URINE CULTURE
MICRO NUMBER:: 15001847
SPECIMEN QUALITY:: ADEQUATE

## 2022-07-09 LAB — CBC WITH DIFFERENTIAL/PLATELET
Absolute Monocytes: 549 cells/uL (ref 200–950)
Basophils Absolute: 20 cells/uL (ref 0–200)
Basophils Relative: 0.4 %
Eosinophils Absolute: 191 cells/uL (ref 15–500)
Eosinophils Relative: 3.9 %
Lymphs Abs: 1446 cells/uL (ref 850–3900)
MCV: 92.8 fL (ref 80.0–100.0)
MPV: 10.8 fL (ref 7.5–12.5)
Monocytes Relative: 11.2 %
Neutro Abs: 2695 cells/uL (ref 1500–7800)
Neutrophils Relative %: 55 %
RBC: 3.87 10*6/uL (ref 3.80–5.10)
RDW: 12.2 % (ref 11.0–15.0)

## 2022-07-09 LAB — BASIC METABOLIC PANEL
Calcium: 10.1 mg/dL (ref 8.6–10.4)
Chloride: 97 mmol/L — ABNORMAL LOW (ref 98–110)
Creat: 0.7 mg/dL (ref 0.60–0.95)
Glucose, Bld: 98 mg/dL (ref 65–99)
Potassium: 4.2 mmol/L (ref 3.5–5.3)
Sodium: 135 mmol/L (ref 135–146)

## 2022-07-11 ENCOUNTER — Other Ambulatory Visit: Payer: Self-pay

## 2022-07-11 DIAGNOSIS — R350 Frequency of micturition: Secondary | ICD-10-CM

## 2022-07-11 DIAGNOSIS — N390 Urinary tract infection, site not specified: Secondary | ICD-10-CM

## 2022-07-11 MED ORDER — CIPROFLOXACIN HCL 500 MG PO TABS
500.0000 mg | ORAL_TABLET | Freq: Two times a day (BID) | ORAL | 0 refills | Status: AC
Start: 2022-07-11 — End: 2022-07-18

## 2022-07-11 MED ORDER — SACCHAROMYCES BOULARDII 250 MG PO CAPS
250.0000 mg | ORAL_CAPSULE | Freq: Two times a day (BID) | ORAL | 0 refills | Status: AC
Start: 2022-07-11 — End: 2022-07-21

## 2022-07-11 NOTE — Telephone Encounter (Signed)
Oxycodone duplicate prescription declined.

## 2022-07-11 NOTE — Telephone Encounter (Signed)
Error

## 2022-07-20 ENCOUNTER — Ambulatory Visit: Payer: Medicare Other | Admitting: Podiatry

## 2022-07-20 ENCOUNTER — Inpatient Hospital Stay: Payer: Medicare Other | Admitting: Nurse Practitioner

## 2022-07-21 ENCOUNTER — Telehealth: Payer: Self-pay

## 2022-07-21 NOTE — Telephone Encounter (Signed)
Patient's daughter will need to verify with [employer] program whether they provide special diet for patients who are diabetic.

## 2022-07-21 NOTE — Telephone Encounter (Signed)
Patient's daughter called today because she has received some information about [employer] that is a program that help with health goals through the power of healthy food. The program includes healthy home delivered meals and groceries. Patient daughter wanted to ask that with mother begin a prediabetic would this benefit her to help with her diet.  Message routed to Ngetich, Donalee Citrin, NP

## 2022-07-23 ENCOUNTER — Other Ambulatory Visit: Payer: Self-pay | Admitting: Family

## 2022-07-24 ENCOUNTER — Telehealth: Payer: Self-pay

## 2022-07-24 DIAGNOSIS — R160 Hepatomegaly, not elsewhere classified: Secondary | ICD-10-CM

## 2022-07-24 NOTE — Telephone Encounter (Signed)
Patient's daughter called stating that she was told by [facility] that they will no longer be able to provide care for patient due to her diagnosis of cancer. She was told that patient needs palliative care. Patient needs order for palliative care. Please advise.  Message sent to Richarda Blade, NP

## 2022-07-24 NOTE — Telephone Encounter (Signed)
Medication ;last filled 06/19/22, but for only 6 tablets. Treatment agreement not up to date. Last signed 12/09/2020.  Medication pended and sent to Richarda Blade, NP to update prescription and send to pharmacy if appropriate.

## 2022-07-24 NOTE — Telephone Encounter (Signed)
Palliative referral ordered as requested.

## 2022-07-25 NOTE — Telephone Encounter (Signed)
Left message on voicemail for patient to return call when available   

## 2022-07-26 ENCOUNTER — Telehealth: Payer: Self-pay | Admitting: Oncology

## 2022-07-26 ENCOUNTER — Inpatient Hospital Stay: Payer: Medicare Other | Admitting: Nurse Practitioner

## 2022-07-26 NOTE — Telephone Encounter (Signed)
Stoney Bang called to cancel appointment for today.

## 2022-07-28 ENCOUNTER — Telehealth: Payer: Self-pay

## 2022-07-28 NOTE — Telephone Encounter (Signed)
(  3:00 pm) PC SW left a message for patient's daughter Asma requesting a call back in regards to the palliative care referral

## 2022-08-03 ENCOUNTER — Emergency Department (HOSPITAL_COMMUNITY)
Admission: EM | Admit: 2022-08-03 | Discharge: 2022-08-03 | Disposition: A | Discharge status: Home / Self Care | Payer: Medicare Other | Attending: Emergency Medicine | Admitting: Emergency Medicine

## 2022-08-03 ENCOUNTER — Telehealth: Payer: Self-pay

## 2022-08-03 DIAGNOSIS — Z8505 Personal history of malignant neoplasm of liver: Secondary | ICD-10-CM | POA: Insufficient documentation

## 2022-08-03 DIAGNOSIS — D490 Neoplasm of unspecified behavior of digestive system: Secondary | ICD-10-CM

## 2022-08-03 DIAGNOSIS — C229 Malignant neoplasm of liver, not specified as primary or secondary: Secondary | ICD-10-CM | POA: Diagnosis not present

## 2022-08-03 DIAGNOSIS — F0390 Unspecified dementia without behavioral disturbance: Secondary | ICD-10-CM | POA: Diagnosis not present

## 2022-08-03 DIAGNOSIS — R109 Unspecified abdominal pain: Secondary | ICD-10-CM | POA: Diagnosis not present

## 2022-08-03 DIAGNOSIS — Z79899 Other long term (current) drug therapy: Secondary | ICD-10-CM | POA: Diagnosis not present

## 2022-08-03 DIAGNOSIS — Z7982 Long term (current) use of aspirin: Secondary | ICD-10-CM | POA: Diagnosis not present

## 2022-08-03 DIAGNOSIS — I10 Essential (primary) hypertension: Secondary | ICD-10-CM | POA: Insufficient documentation

## 2022-08-03 DIAGNOSIS — R1900 Intra-abdominal and pelvic swelling, mass and lump, unspecified site: Secondary | ICD-10-CM | POA: Diagnosis not present

## 2022-08-03 DIAGNOSIS — R609 Edema, unspecified: Secondary | ICD-10-CM | POA: Diagnosis not present

## 2022-08-03 DIAGNOSIS — C220 Liver cell carcinoma: Secondary | ICD-10-CM | POA: Diagnosis not present

## 2022-08-03 DIAGNOSIS — R140 Abdominal distension (gaseous): Secondary | ICD-10-CM | POA: Diagnosis not present

## 2022-08-03 LAB — COMPREHENSIVE METABOLIC PANEL
ALT: 61 U/L — ABNORMAL HIGH (ref 0–44)
AST: 47 U/L — ABNORMAL HIGH (ref 15–41)
Albumin: 3.8 g/dL (ref 3.5–5.0)
Alkaline Phosphatase: 125 U/L (ref 38–126)
Anion gap: 12 (ref 5–15)
BUN: 16 mg/dL (ref 8–23)
CO2: 26 mmol/L (ref 22–32)
Calcium: 9.8 mg/dL (ref 8.9–10.3)
Chloride: 99 mmol/L (ref 98–111)
Creatinine, Ser: 0.72 mg/dL (ref 0.44–1.00)
GFR, Estimated: >60 mL/min (ref >60)
Glucose, Bld: 106 mg/dL — ABNORMAL HIGH (ref 70–99)
Potassium: 4 mmol/L (ref 3.5–5.1)
Sodium: 137 mmol/L (ref 135–145)
Total Bilirubin: 0.8 mg/dL (ref 0.3–1.2)
Total Protein: 7.1 g/dL (ref 6.5–8.1)

## 2022-08-03 LAB — CBC WITH DIFFERENTIAL/PLATELET
Abs Immature Granulocytes: 0.02 10*3/uL (ref 0.00–0.07)
Basophils Absolute: 0 10*3/uL (ref 0.0–0.1)
Basophils Relative: 0 %
Eosinophils Absolute: 0.2 10*3/uL (ref 0.0–0.5)
Eosinophils Relative: 4 %
HCT: 37 % (ref 36.0–46.0)
Hemoglobin: 12 g/dL (ref 12.0–15.0)
Immature Granulocytes: 0 %
Lymphocytes Relative: 30 %
Lymphs Abs: 1.7 10*3/uL (ref 0.7–4.0)
MCH: 30.5 pg (ref 26.0–34.0)
MCHC: 32.4 g/dL (ref 30.0–36.0)
MCV: 94.1 fL (ref 80.0–100.0)
Monocytes Absolute: 0.7 10*3/uL (ref 0.1–1.0)
Monocytes Relative: 12 %
Neutro Abs: 3 10*3/uL (ref 1.7–7.7)
Neutrophils Relative %: 54 %
Platelets: 202 10*3/uL (ref 150–400)
RBC: 3.93 MIL/uL (ref 3.87–5.11)
RDW: 13 % (ref 11.5–15.5)
WBC: 5.6 10*3/uL (ref 4.0–10.5)
nRBC: 0 % (ref 0.0–0.2)

## 2022-08-03 LAB — LIPASE, BLOOD: Lipase: 36 U/L (ref 11–51)

## 2022-08-03 LAB — AMMONIA: Ammonia: <10 umol/L (ref 9–35)

## 2022-08-03 NOTE — Telephone Encounter (Signed)
Wynona Canes called on behalf of patient stating patient is shaking x 30 minutes and her stomach is significantly enlarged. I offered appointment today at 3:40 and suggested that she go ahead and bring patient over, despite her being placed in a 3:40 appt time and Wynona Canes refused as she is unable to assist the patient with ambulating.  I suggested that Meta call EMS, Wynona Canes agreed and mentioned that she feels patient needs to be evaluated in the emergency room. Wynona Canes also explained that she gave patient a hershey kisses in case her symptoms are related to a low blood sugar in which she does not have the ability to check.   Carilyn Goodpasture is out of office and I will forward message to covering provider, Kenard Gower, NP

## 2022-08-03 NOTE — ED Triage Notes (Addendum)
PT BIB [facility] from [facility].  Per EMS pt sent in for increasing abdominal swelling throughout the day.  Per EMS, Abdomen looked like a basketball and had gone down some on arrival to ED.  Per EMS pt has hx of cirrhosis of liver.  No obvious ETOH use.  PT endorses RLQ pain and states that "they say I have a mass on my liver.   Per EMS, caregiver states PT has dementia and is at baseline but is answering slower than normal.  Pt is alert to self.   131/65 93% RA, 98% 2L, RR 18, HR 67, CBG 103

## 2022-08-03 NOTE — ED Provider Notes (Addendum)
[place] EMERGENCY DEPARTMENT AT [facility] Provider Note   CSN: [id number] Arrival date & time: 08/03/22  1441     History  No chief complaint on file.   Ariana [name] is a 89 y.o. female history of hepatocellular cancer, cirrhosis, hypertension, A-fib with RVR, dementia, multinodular thyroid, Sjogren's, CVA, AKI, SAH presented for distended abdomen.  Patient was seen on 06/15/2022 and diagnosed with a large liver mass and since then patient has had continuing distention of her abdomen.  Patient does live at SNF with caregiver and was brought in today due to caregiver feeling that her abdomen was more distended than normal.  Caregiver initially thought that patient sugar was low as she was shaking for approximately 30 minutes and gave patient her she kisses however is unsure if this helped.  Patient is pleasantly confused and at baseline and so the rest of patient's history is obtained from patient's daughter.  Patient's daughter stated patient has not had any fevers or hematemesis recently or any other illnesses.  Patient is currently DNR and daughter states that if patient is unable to make her own decisions to proceed with abdominal pain labs and to wait on the CT/other advanced imaging as patient recently had CT and MRI done last month and patient's daughter does not believe these to be beneficial at this time.  ROS cannot be obtained from patient due to dementia  Home Medications Prior to Admission medications   Medication Sig Start Date End Date Taking? Authorizing Provider  acetaminophen (TYLENOL) 500 MG tablet Take 1,000 mg by mouth 2 (two) times daily.    [provider]  ALPRAZolam Prudy Feeler) 0.25 MG tablet Take 1 tablet by mouth twice daily as needed for anxiety 07/24/22   Ngetich, Dinah C, NP  antiseptic oral rinse (BIOTENE) LIQD 15 mLs by Mouth Rinse route as needed.    [provider]  aspirin 325 MG tablet Take 325 mg by mouth daily. Takes at [facility].     [provider]  bisacodyl (DULCOLAX) 5 MG EC tablet Take 1 tablet (5 mg total) by mouth daily as needed for moderate constipation. 06/19/22   Hongalgi, Maximino Greenland, MD  Calcium Carb-Cholecalciferol (CALCIUM 600+D) 600-20 MG-MCG TABS Take 600 mcg by mouth daily.    [provider]  carboxymethylcellulose (REFRESH PLUS) 0.5 % SOLN Place 1 drop into both eyes in the morning, at noon, in the evening, and at bedtime.    [provider]  CRANBERRY PO Take 2 tablets by mouth daily. AZO    [provider]  diltiazem (CARDIZEM CD) 180 MG 24 hr capsule Take 1 capsule by mouth once daily 01/13/22   Hilty, Lisette Abu, MD  DULoxetine (CYMBALTA) 30 MG capsule Take 1 capsule (30 mg total) by mouth daily. Takes at [facility] 06/19/22   Hongalgi, Maximino Greenland, MD  DULoxetine (CYMBALTA) 60 MG capsule Take 1 capsule (60 mg total) by mouth daily. Takes at 5pm. 06/19/22   Elease Etienne, MD  furosemide (LASIX) 40 MG tablet Take 1 tablet by mouth once daily 03/17/22   Ngetich, Dinah C, NP  glucose blood (ONETOUCH VERIO) test strip USE 1 STRIP TO CHECK GLUCOSE ONCE DAILY 06/27/21   Ngetich, Dinah C, NP  Lancets Micro Thin 33G MISC Use to test blood sugar daily. Dx: E08.65 01/19/21   Ngetich, Dinah C, NP  lansoprazole (PREVACID) 30 MG capsule TAKE 1 CAPSULE BY MOUTH ONCE DAILY AT NOON Patient taking differently: Take 30 mg by mouth daily.  TAKE 1 CAPSULE BY MOUTH ONCE DAILY AT NOON 05/15/22   Ngetich, Dinah C, NP  LINZESS 290 MCG CAPS capsule TAKE 1 CAPSULE BY MOUTH EVERY DAY AT BEDTIME Patient taking differently: Take 290 mcg by mouth at bedtime. 05/05/22   Sharon Seller, NP  loratadine (CLARITIN) 10 MG tablet Take 1 tablet (10 mg total) by mouth daily. 05/12/20   Octavia Heir, NP  memantine (NAMENDA) 10 MG tablet Take 1 tablet by mouth twice daily 04/14/22   Ngetich, Dinah C, NP  MYRBETRIQ 50 MG TB24 tablet Take 1 tablet by mouth once daily 05/22/22   Ngetich, Dinah C, NP  oxyCODONE (OXY IR/ROXICODONE) 5 MG  immediate release tablet Take 1 tablet (5 mg total) by mouth every 12 (twelve) hours as needed for moderate pain, breakthrough pain or severe pain. 07/07/22   Ngetich, Dinah C, NP  pilocarpine (SALAGEN) 5 MG tablet Take 1 tablet by mouth twice daily 04/20/22   Ngetich, Dinah C, NP  polyethylene glycol (MIRALAX / GLYCOLAX) 17 g packet Take 17 g by mouth daily. Mix with 8 oz. water or juice 06/19/22   Hongalgi, Theadora Rama D, MD  potassium chloride SA (KLOR-CON M) 20 MEQ tablet TAKE 1  BY MOUTH ONCE DAILY Patient taking differently: Take 20 mEq by mouth daily. 04/20/22   Ngetich, Dinah C, NP  pregabalin (LYRICA) 100 MG capsule Take 1 capsule (100 mg total) by mouth 2 (two) times daily. 06/19/22   Hongalgi, Maximino Greenland, MD  senna-docusate (SENOKOT-S) 8.6-50 MG tablet Take 1 tablet by mouth 2 (two) times daily. 06/19/22   Hongalgi, Maximino Greenland, MD  sitaGLIPtin (JANUVIA) 100 MG tablet Take 1 tablet by mouth once daily 03/17/22   Ngetich, Dinah C, NP      Allergies    Banana, Codeine, Klonopin [clonazepam], Meperidine hcl, Norflex [orphenadrine citrate], Oxycodone-acetaminophen, Propoxyphene hcl, Zoloft [sertraline hcl], Doxycycline, Naproxen, Penicillins, Phenothiazines, Stelazine, Sulfamethoxazole-trimethoprim, Tolectin [tolmetin sodium], and Tramadol    Review of Systems   Review of Systems ROS cannot be obtained from patient due to dementia Physical Exam Updated Vital Signs BP 137/62   Pulse 63   Temp 98.8 F (37.1 C) (Oral)   Resp 12   SpO2 100%  Physical Exam Constitutional:      General: She is not in acute distress.    Appearance: She is not ill-appearing, toxic-appearing or diaphoretic.     Comments: Pleasantly confused but alert and oriented to those around her  Cardiovascular:     Rate and Rhythm: Normal rate and regular rhythm.     Pulses: Normal pulses.     Heart sounds: Normal heart sounds.  Pulmonary:     Effort: Pulmonary effort is normal. No respiratory distress.     Breath sounds: Normal breath  sounds.  Abdominal:     General: There is distension (Mild).     Palpations: Abdomen is soft. There is mass (Right upper quadrant).     Tenderness: There is no abdominal tenderness. There is no guarding or rebound.     Comments: Mildly distended abdomen with no peritoneal signs or tenderness  Musculoskeletal:        General: Normal range of motion.  Skin:    General: Skin is warm and dry.     Capillary Refill: Capillary refill takes less than 2 seconds.     Comments: No overlying skin color changes     ED Results / Procedures / Treatments   Labs (all labs ordered are listed, but only abnormal results  are displayed) Labs Reviewed  COMPREHENSIVE METABOLIC PANEL - Abnormal; Notable for the following components:      Result Value   Glucose, Bld 106 (*)    AST 47 (*)    ALT 61 (*)    All other components within normal limits  CBC WITH DIFFERENTIAL/PLATELET  LIPASE, BLOOD  AMMONIA    EKG None  Radiology No results found.  Procedures Procedures    Medications Ordered in ED Medications - No data to display  ED Course/ Medical Decision Making/ A&P                             Medical Decision Making Amount and/or Complexity of Data Reviewed Labs: ordered.   Cumberland Callas 89 y.o. presented today for distended abdomen. Working DDx that I considered at this time includes, but not limited to, progression of mass, ascites, SBP, intra-abdominal hemorrhage, AAA.  R/o DDx: progression of mass, ascites, SBP, intra-abdominal hemorrhage, AAA: These are considered less likely due to history of present illness and physical exam findings  Review of prior external notes: 06/19/2022 discharge summary  Unique Tests and My Interpretation:  CBC: Unremarkable CMP: Unremarkable Lipase: Negative Ammonia: Negative Abdominal ultrasound: No acute findings outside of previously seen hepatic tumor that is roughly the same size as seen previously  Discussion with Independent Historian:   Daughter  Discussion of Management of Tests: None  Risk: Low: based on diagnostic testing/clinical impression and treatment plan  Risk Stratification Score: None  Staffed with Theresia Lo, DO  Plan: On exam patient was in no acute distress and stable vitals.  Patient was pleasantly confused throughout the encounter but did state that her right-sided abdomen was hurting.  History was provided by patient's daughter via phone call after confirming daughter's identity.  Daughter is unsure of why patient was brought in and as her power of attorney states that if her mom's presentation is reassuring and that only blood labs may be drawn and CT and MRI can be withheld at this time as patient recently had those done and daughter does not think this will change patient's course.  I spoke to the attending and we ultrasounded patient's abdomen did not note any ascites but did see the 9 cm tumor on her liver which is similar size as seen previously.  Since patient appears at baseline the attending I agree to a CT of the head would not be needed at this time as we do not feel it would change management.  Labs will be drawn and if within patient's baseline patient may be discharged.  Patient's labs are reassuring.  I spoke to patient's daughter again and patient daughter was in agreement that patient can be discharged with outpatient follow-up.  Patient will be discharged and encouraged to follow-up with her primary care provider.  Patient was given return precautions. Patient stable for discharge at this time.  Patient verbalized understanding of plan.   Final Clinical Impression(s) / ED Diagnoses Final diagnoses:  Abdominal distension  Hepatic tumor    Rx / DC Orders ED Discharge Orders     None         Netta Corrigan, PA-C 08/03/22 1656    Elayne Snare K, DO 08/04/22 [phone]

## 2022-08-03 NOTE — Discharge Instructions (Signed)
Please follow-up with your primary care regarding recent symptoms and ER visit.  Today your labs and ultrasound were all reassuring.  The mass on your liver is roughly 9 cm which appears to be the same size as seen previously.  If symptoms are change or worsen please return to ER.

## 2022-08-03 NOTE — Telephone Encounter (Signed)
Noted. I hope she feels better soon.

## 2022-08-03 NOTE — ED Notes (Signed)
Report given to  at [facility]

## 2022-08-03 NOTE — ED Notes (Signed)
Ptar called pt to [facility], NO ETA

## 2022-08-03 NOTE — ED Notes (Signed)
Spoke with daughter Rikka Paraiso patient will be discharged home. Patient does not stay in [facility] but home. Grand daughter Irving Burton will pick patient up.

## 2022-08-03 NOTE — ED Notes (Signed)
Pts family called and notified of discharged.Per the daughter, Tambi Odaniel. Her granddaughter Irving Burton will be picking her up.

## 2022-08-03 NOTE — ED Provider Notes (Signed)
  Physical Exam  BP 137/62   Pulse 63   Temp 98.8 F (37.1 C) (Oral)   Resp 12   SpO2 100%   Physical Exam  Procedures  Ultrasound ED FAST  Date/Time: 08/03/2022 3:46 PM  Performed by: Rexford Maus, DO Authorized by: Rexford Maus, DO  Procedure details:     Indications comment:  Abdominal distension, hx of cirrhosis/HCC    Assess for:  Intra-abdominal fluid    Technique:  Abdominal    Images: archived      Abdominal findings:    L kidney:  Visualized   R kidney:  Visualized   Liver:  Visualized    Hepatorenal space visualized: identified     Splenorenal space: identified     Splenorenal free fluid: not identified     Hepatorenal space free fluid: not identified     ED Course / MDM    Medical Decision Making Amount and/or Complexity of Data Reviewed Labs: ordered.          Elayne Snare K, DO 08/03/22 1547

## 2022-08-04 ENCOUNTER — Telehealth: Payer: Self-pay

## 2022-08-04 DIAGNOSIS — Z515 Encounter for palliative care: Secondary | ICD-10-CM

## 2022-08-04 NOTE — Telephone Encounter (Signed)
(  4:27 pm)PC SW completed a follow-up call to patient's daughter-Alveena Lea, regarding palliative care referral. Patient has had some decline/changes and they are weighing all options for care. SW provided education regadrJanet advised that she wanted to put referral on hold for now. SW reinforced contact information and advised her to call if/when patient is ready for services.  Patient moved to inactive.

## 2022-08-07 ENCOUNTER — Telehealth: Payer: Self-pay

## 2022-08-07 NOTE — Transitions of Care (Post Inpatient/ED Visit) (Signed)
08/07/2022  Name: Ariana [name] MRN: [id number] DOB: 12-14-1929  Today's TOC FU Call Status: Today's TOC FU Call Status:: Successful TOC FU Call Competed TOC FU Call Complete Date: 08/07/22 (Call completed with daughter/POA-Ariana [name].)    Red on EMMI-ED Discharge Alert Date & Reason:08/05/22 "Scheduled follow-up appt? No" "Have d/[name] instructions? No"  Daughter reports that pt's caregiver was the one who took pt to ED. Daughter has back problems and physically unable to go see pt often-her spouse assists with caring for her mother. She reports she was given a call while pt in ED and updated on the findings and plans.  Transition Care Management Follow-up Telephone Call Date of Discharge: 08/03/22 Discharge Facility: [facility]) Type of Discharge: Emergency Department Reason for ED Visit: Other: ("abd distention") How have you been since you were released from the hospital?: Same (Dtr states pt doing fairly well-had some diarrhea over weekend-thinks its related to prebiotic she is taking-has being hold it-sxs resolved.) Any questions or concerns?: No  Items Reviewed: Did you receive and understand the discharge instructions provided?: Yes Medications obtained,verified, and reconciled?: Partial Review Completed Reason for Partial Mediation Review: daughter not in the home with pt and meds Any new allergies since your discharge?: No Dietary orders reviewed?: Yes Do you have support at home?: Yes Name of Support/Comfort Primary Source: pt has paid caregiver in the home several hrs/day  Medications Reviewed Today: Medications Reviewed Today     Reviewed by Ariana Minerva, RN (Registered Nurse) on 08/07/22 at 1310  Med List Status: <None>   Medication Order Taking? Sig Documenting Provider Last Dose Status Informant  acetaminophen (TYLENOL) 500 MG tablet [id number] No Take 1,000 mg by mouth 2 (two) times daily. [provider] Taking Active Child  ALPRAZolam  (XANAX) 0.25 MG tablet [id number]  Take 1 tablet by mouth twice daily as needed for anxiety Ngetich, [name], NP  Active   antiseptic oral rinse (BIOTENE) LIQD [id number] No 15 mLs by Mouth Rinse route as needed. [provider] Taking Active Child  aspirin 325 MG tablet [id number] No Take 325 mg by mouth daily. Takes at 5pm. [provider] Taking Active Child  bisacodyl (DULCOLAX) 5 MG EC tablet [id number] No Take 1 tablet (5 mg total) by mouth daily as needed for moderate constipation. Ariana Etienne, MD Taking Active   Calcium Carb-Cholecalciferol (CALCIUM 600+D) 600-20 MG-MCG TABS [id number] No Take 600 mcg by mouth daily. [provider] Taking Active Child  carboxymethylcellulose (REFRESH PLUS) 0.5 % SOLN [id number] No Place 1 drop into both eyes in the morning, at noon, in the evening, and at bedtime. [provider] Taking Active Child  CRANBERRY PO [id number] No Take 2 tablets by mouth daily. AZO [provider] Taking Active Child  diltiazem (CARDIZEM CD) 180 MG 24 hr capsule [id number] No Take 1 capsule by mouth once daily Ariana [name], Ariana Abu, MD Taking Active Child  DULoxetine (CYMBALTA) 30 MG capsule [id number] No Take 1 capsule (30 mg total) by mouth daily. Takes at [facility], Ariana Greenland, MD Taking Active   DULoxetine (CYMBALTA) 60 MG capsule [id number] No Take 1 capsule (60 mg total) by mouth daily. Takes at 5pm. Ariana Etienne, MD Taking Active   furosemide (LASIX) 40 MG tablet [id number] No Take 1 tablet by mouth once daily Ngetich, [name], NP Taking Active Child  glucose blood (ONETOUCH VERIO) test strip [id number] No USE 1 STRIP TO CHECK GLUCOSE ONCE DAILY Ngetich, [name], NP Taking Active Child  Lancets Micro Thin 33G MISC [id number] No Use to test blood sugar daily. Dx: E08.65 Ngetich, [name], NP Taking Active Child  lansoprazole (PREVACID) 30 MG capsule [id number] No TAKE 1 CAPSULE BY MOUTH ONCE DAILY AT NOON  Patient taking differently: Take  30 mg by mouth daily. TAKE 1 CAPSULE BY MOUTH ONCE DAILY AT NOON   Ngetich, [name], NP Taking Active Child  LINZESS 290 MCG CAPS capsule [id number] No TAKE 1 CAPSULE BY MOUTH EVERY DAY AT BEDTIME  Patient taking differently: Take 290 mcg by mouth at bedtime.   Ariana Seller, NP Taking Active Child  loratadine (CLARITIN) 10 MG tablet [id number] No Take 1 tablet (10 mg total) by mouth daily. Ariana Heir, NP Taking Active Child  memantine (NAMENDA) 10 MG tablet [id number] No Take 1 tablet by mouth twice daily Ngetich, [name], NP Taking Active Child  MYRBETRIQ 50 MG TB24 tablet [id number] No Take 1 tablet by mouth once daily Ngetich, [name], NP Taking Active Child  oxyCODONE (OXY IR/ROXICODONE) 5 MG immediate release tablet [id number]  Take 1 tablet (5 mg total) by mouth every 12 (twelve) hours as needed for moderate pain, breakthrough pain or severe pain. Ngetich, [name], NP  Active   pilocarpine (SALAGEN) 5 MG tablet [id number] No Take 1 tablet by mouth twice daily Ngetich, [name], NP Taking Active Child  polyethylene glycol (MIRALAX / GLYCOLAX) 17 g packet [id number] No Take 17 g by mouth daily. Mix with 8 oz. water or juice Ariana [name], Ariana Greenland, MD Taking Active   potassium chloride SA (KLOR-CON M) 20 MEQ tablet [id number] No TAKE 1  BY MOUTH ONCE DAILY  Patient taking differently: Take 20 mEq by mouth daily.   Ngetich, Ariana Citrin, NP Taking Active Child  pregabalin (LYRICA) 100 MG capsule [id number] No Take 1 capsule (100 mg total) by mouth 2 (two) times daily. Ariana Etienne, MD Taking Active   senna-docusate (SENOKOT-S) 8.6-50 MG tablet [id number] No Take 1 tablet by mouth 2 (two) times daily. Ariana Etienne, MD Taking Active   sitaGLIPtin (JANUVIA) 100 MG tablet [id number] No Take 1 tablet by mouth once daily Ngetich, Ariana Citrin, NP Taking Active Child  Med List Note Ariana [name], Ariana [name] 04/23/17 2038): Ariana [name] (daughter) [phone] manages pt's medications            Home  Care and Equipment/Supplies: Were Home Health Services Ordered?: NA Any new equipment or medical supplies ordered?: NA  Functional Questionnaire: Do you need assistance with bathing/showering or dressing?: Yes Do you need assistance with meal preparation?: Yes Do you need assistance with eating?: No Do you have difficulty maintaining continence: Yes Do you have difficulty managing or taking your medications?: Yes  Follow up appointments reviewed: PCP Follow-up appointment confirmed?: Yes Date of PCP follow-up appointment?: 08/18/22 Follow-up Provider: [facility] Follow-up appointment confirmed?: Yes Date of Specialist follow-up appointment?: 08/18/22 Do you need transportation to your follow-up appointment?: No (caregiver or family takes pt to appts) Do you understand care options if your condition(s) worsen?: Yes-patient verbalized understanding  SDOH Interventions Today    Flowsheet Row Most Recent Value  SDOH Interventions   Food Insecurity Interventions Intervention Not Indicated  Transportation Interventions Intervention Not Indicated      TOC Interventions Today    Flowsheet Row Most Recent Value  TOC Interventions   TOC Interventions Discussed/Reviewed TOC Interventions Discussed      Interventions Today    Flowsheet Row Most Recent Value  General  Interventions   General Interventions Discussed/Reviewed General Interventions Discussed, Doctor Visits  Doctor Visits Discussed/Reviewed PCP, Doctor Visits Discussed, Specialist  PCP/Specialist Visits Compliance with follow-up visit  Education Interventions   Education Provided Provided Education  Provided Verbal Education On Medication, When to see the doctor  Nutrition Interventions   Nutrition Discussed/Reviewed Nutrition Discussed  Pharmacy Interventions   Pharmacy Dicussed/Reviewed Pharmacy Topics Discussed, Medications and their functions  Safety Interventions   Safety  Discussed/Reviewed Safety Discussed  Advanced Directive Interventions   Advanced Directives Discussed/Reviewed Advanced Directives Discussed  [daughter is still considering palliative care-has agency number to call- discussed with her the benefits of services and additonal support/education]       Alessandra Grout [facility] Care Management Care Management Community Coordinator Direct Phone: [phone] Toll Free: [phone] Fax: [phone]

## 2022-08-15 ENCOUNTER — Other Ambulatory Visit: Payer: Self-pay

## 2022-08-15 DIAGNOSIS — R1010 Upper abdominal pain, unspecified: Secondary | ICD-10-CM

## 2022-08-15 MED ORDER — OXYCODONE HCL 5 MG PO TABS
5.0000 mg | ORAL_TABLET | Freq: Two times a day (BID) | ORAL | 0 refills | Status: DC | PRN
Start: 2022-08-15 — End: 2022-08-29

## 2022-08-15 NOTE — Telephone Encounter (Signed)
Patient needs refill on Oxycodone. Medication last refilled 07/07/22. Up to date contract on file  Medication pended and sent to Abbey Chatters, NP

## 2022-08-18 ENCOUNTER — Ambulatory Visit: Payer: Medicare Other | Admitting: Family

## 2022-08-23 ENCOUNTER — Other Ambulatory Visit: Payer: Self-pay | Admitting: Family

## 2022-08-23 DIAGNOSIS — K219 Gastro-esophageal reflux disease without esophagitis: Secondary | ICD-10-CM

## 2022-08-23 DIAGNOSIS — K2100 Gastro-esophageal reflux disease with esophagitis, without bleeding: Secondary | ICD-10-CM

## 2022-08-23 NOTE — Telephone Encounter (Signed)
Patient is requesting a refill of the following medications: Requested Prescriptions   Pending Prescriptions Disp Refills   lansoprazole (PREVACID) 30 MG capsule [Pharmacy Med Name: Lansoprazole 30 MG Oral Capsule Delayed Release] 90 capsule 0    Sig: TAKE 1 CAPSULE BY MOUTH ONCE DAILY AT NOON   ALPRAZolam (XANAX) 0.25 MG tablet [Pharmacy Med Name: ALPRAZolam 0.25 MG Oral Tablet] 60 tablet 0    Sig: Take 1 tablet by mouth twice daily as needed for anxiety    Date of last refill: 07/24/2022  Refill amount: 0  Treatment agreement date: 12/09/2020

## 2022-08-24 ENCOUNTER — Ambulatory Visit: Payer: Medicare Other | Admitting: Podiatry

## 2022-08-29 ENCOUNTER — Ambulatory Visit (INDEPENDENT_AMBULATORY_CARE_PROVIDER_SITE_OTHER): Payer: Medicare Other | Admitting: Family

## 2022-08-29 ENCOUNTER — Encounter: Payer: Self-pay | Admitting: Family

## 2022-08-29 VITALS — BP 138/74 | HR 70 | Temp 97.1°F | Ht 63.25 in | Wt 136.0 lb

## 2022-08-29 DIAGNOSIS — F03918 Unspecified dementia, unspecified severity, with other behavioral disturbance: Secondary | ICD-10-CM | POA: Diagnosis not present

## 2022-08-29 DIAGNOSIS — L03116 Cellulitis of left lower limb: Secondary | ICD-10-CM | POA: Diagnosis not present

## 2022-08-29 DIAGNOSIS — R1010 Upper abdominal pain, unspecified: Secondary | ICD-10-CM | POA: Diagnosis not present

## 2022-08-29 DIAGNOSIS — R160 Hepatomegaly, not elsewhere classified: Secondary | ICD-10-CM | POA: Diagnosis not present

## 2022-08-29 MED ORDER — CLINDAMYCIN HCL 150 MG PO CAPS
150.0000 mg | ORAL_CAPSULE | Freq: Three times a day (TID) | ORAL | 0 refills | Status: AC
Start: 2022-08-29 — End: 2022-09-05

## 2022-08-29 MED ORDER — OXYCODONE HCL 5 MG PO TABS
5.0000 mg | ORAL_TABLET | Freq: Two times a day (BID) | ORAL | 0 refills | Status: DC | PRN
Start: 2022-08-29 — End: 2022-11-20

## 2022-08-29 NOTE — Progress Notes (Signed)
Provider: Richarda Blade FNP-C  Shahana Capes, Donalee Citrin, NP  Patient Care Team: Anab Vivar, Donalee Citrin, NP as PCP - General (Family Medicine) Chrystie Nose, MD as PCP - Cardiology (Cardiology) [name], MD as Consulting Physician (Pulmonary Disease) Freddy Finner, MD (Inactive) as Consulting Physician (Obstetrics and Gynecology) Chrystie Nose, MD as Consulting Physician (Cardiology) Mateo Flow, MD as Consulting Physician (Ophthalmology) York Spaniel, MD (Inactive) as Consulting Physician (Neurology) Marcine Matar, MD as Consulting Physician (Urology) Janalyn Harder, MD (Inactive) as Consulting Physician (Dermatology) Iva Boop, MD as Consulting Physician (Gastroenterology) Serita Grit as Social Worker Octavia Heir, NP as Nurse Practitioner (Adult Health Nurse Practitioner)  Extended Emergency Contact Information Primary Emergency Contact: Molinda Bailiff Address: [address]          [place], [place] [address] [place] Phone: [phone] Relation: Daughter Secondary Emergency Contact: Candi Leash [place] of [place] Mobile Phone: [phone] Relation: Other  Code Status: Full Code  Goals of care: Advanced Directive information    08/29/2022    2:16 PM  Advanced Directives  Does Patient Have a Medical Advance Directive? No  Would patient like information on creating a medical advance directive? No - Patient declined     Chief Complaint  Patient presents with   Acute Visit    Discuss pain medication and left leg concerns (swelling, warm to touch, and discolored areas). Patient also c/o hemorrhoid pain. High fall risk. Here with caregiver, Wynona Canes. Medication are unknown with the exception of oxycodone as they are managed by patients daughter whom is not present at appointment today.     HPI:  Pt is a 89 y.o. female seen today for an acute visit to Discuss pain medication and left leg  swelling, warm to touch, and red. She is here with caregiver, Wynona Canes who provides additional HPI information.states had small spot that she might have hit on the bed. She denies any fever or chills.   Medication are unknown with the exception of oxycodone as they are managed by patients daughter whom is not present at appointment today. She request oxycodone refill for abdominal pain due to worsening liver mass.states abdomen is hard.she was on seen in ED 08/03/2022 had CT/MRI done on previous visit 06/14/2012.she was seen by palliative care but patient states was not discharge on palliative care.Care giver will discuss with patient 's daughter to up date on Palliative care then will order if not already ordered.    Past Medical History:  Diagnosis Date   Abnormality of gait    Adenomatous polyp of colon 2002   7mm   Allergic rhinitis    Anxiety    Anxiety and depression    Chronic back pain    Dementia without behavioral disturbance (HCC)    Depression    Diabetes (HCC)    Diverticulosis of colon    Dry eye syndrome    Dysphagia    Dysthymic disorder    Fall    Fibromyalgia    GERD (gastroesophageal reflux disease)    H/O hiatal hernia    History of adverse drug reaction    History of cerebrovascular disease 09/24/2014   History of recurrent UTIs    Hypertension, benign    Irritable bowel syndrome    Low back pain syndrome    Memory loss    Mitral valve prolapse    Paroxysmal A-fib (HCC)    Peripheral neuropathy    "both feet and legs"  Physical deconditioning    Sjogren's syndrome (HCC)    Therapeutic opioid-induced constipation (OIC)    Thyroid nodule    Urinary incontinence    Past Surgical History:  Procedure Laterality Date   ABDOMINAL HYSTERECTOMY  1967   APPENDECTOMY     CARDIAC CATHETERIZATION  02/17/2003   normal L main, LAD free of disease, Cfx free of disease, RCA free of disease (Dr. Mervyn Skeeters. [place])   CATARACT EXTRACTION, BILATERAL     CHOLECYSTECTOMY  2000    COLONOSCOPY W/ BIOPSIES     multiple   DENTAL SURGERY     multiple tooth extractions   ESOPHAGOGASTRODUODENOSCOPY (EGD) WITH ESOPHAGEAL DILATION N/A 08/23/2012   Procedure: ESOPHAGOGASTRODUODENOSCOPY (EGD) WITH ESOPHAGEAL DILATION;  Surgeon: Rachael Fee, MD;  Location: [facility];  Service: Endoscopy;  Laterality: N/A;   NASAL SEPTUM SURGERY  1980   NM MYOCAR PERF WALL MOTION  2003   persantine - normal static and dynamic study w/apical thinning and presvered LV function, no ischemia   SINUS EXPLORATION     ossifiying fibroma   TEMPOROMANDIBULAR JOINT SURGERY  1986   Dr. Warren Danes   TRANSTHORACIC ECHOCARDIOGRAM  2001   mild LVH, normal LV    Allergies  Allergen Reactions   Banana Nausea And Vomiting   Codeine Nausea Only    unless given with Phenergan   Klonopin [Clonazepam] Other (See Comments)    Causes hallucination    Meperidine Hcl Nausea Only    unless given with Phenergan   Norflex [Orphenadrine Citrate] Nausea Only    Unless given with Phenergan   Oxycodone-Acetaminophen Nausea Only    unless given with phenergan   Propoxyphene Hcl Nausea Only    unless given with phenergan   Zoloft [Sertraline Hcl] Other (See Comments)    Caused lethargy   Doxycycline Other (See Comments)    Unknown reaction   Naproxen Other (See Comments)    Unknown reaction   Penicillins Other (See Comments)    Unknown reaction Has patient had a PCN reaction causing immediate rash, facial/tongue/throat swelling, SOB or lightheadedness with hypotension: Unknown Has patient had a PCN reaction causing severe rash involving mucus membranes or skin necrosis: Unknown Has patient had a PCN reaction that required hospitalization: pt was in the hospital at time of reaction Has patient had a PCN reaction occurring within the last 10 years: Unknown If all of the above answers are "NO", then may proceed with Cephalos   Phenothiazines Other (See Comments)    Unknown reaction   Stelazine Other (See  Comments)    Unknown reaction   Sulfamethoxazole-Trimethoprim Other (See Comments)    Unknown reaction   Tolectin [Tolmetin Sodium] Other (See Comments)    Unknown reaction   Tramadol Other (See Comments)    Unknown reaction    Outpatient Encounter Medications as of 08/29/2022  Medication Sig   oxyCODONE (OXY IR/ROXICODONE) 5 MG immediate release tablet Take 1 tablet (5 mg total) by mouth every 12 (twelve) hours as needed for moderate pain, breakthrough pain or severe pain.   acetaminophen (TYLENOL) 500 MG tablet Take 1,000 mg by mouth 2 (two) times daily.   ALPRAZolam (XANAX) 0.25 MG tablet Take 1 tablet by mouth twice daily as needed for anxiety   antiseptic oral rinse (BIOTENE) LIQD 15 mLs by Mouth Rinse route as needed.   aspirin 325 MG tablet Take 325 mg by mouth daily. Takes at 5pm.   bisacodyl (DULCOLAX) 5 MG EC tablet Take 1 tablet (5 mg total) by mouth  daily as needed for moderate constipation.   Calcium Carb-Cholecalciferol (CALCIUM 600+D) 600-20 MG-MCG TABS Take 600 mcg by mouth daily.   carboxymethylcellulose (REFRESH PLUS) 0.5 % SOLN Place 1 drop into both eyes in the morning, at noon, in the evening, and at bedtime.   CRANBERRY PO Take 2 tablets by mouth daily. AZO   diltiazem (CARDIZEM CD) 180 MG 24 hr capsule Take 1 capsule by mouth once daily   DULoxetine (CYMBALTA) 30 MG capsule Take 1 capsule (30 mg total) by mouth daily. Takes at 9am   DULoxetine (CYMBALTA) 60 MG capsule Take 1 capsule (60 mg total) by mouth daily. Takes at 5pm.   furosemide (LASIX) 40 MG tablet Take 1 tablet by mouth once daily   glucose blood (ONETOUCH VERIO) test strip USE 1 STRIP TO CHECK GLUCOSE ONCE DAILY   Lancets Micro Thin 33G MISC Use to test blood sugar daily. Dx: E08.65   lansoprazole (PREVACID) 30 MG capsule TAKE 1 CAPSULE BY MOUTH ONCE DAILY AT NOON   LINZESS 290 MCG CAPS capsule TAKE 1 CAPSULE BY MOUTH EVERY DAY AT BEDTIME (Patient taking differently: Take 290 mcg by mouth at bedtime.)    loratadine (CLARITIN) 10 MG tablet Take 1 tablet (10 mg total) by mouth daily.   memantine (NAMENDA) 10 MG tablet Take 1 tablet by mouth twice daily   MYRBETRIQ 50 MG TB24 tablet Take 1 tablet by mouth once daily   pilocarpine (SALAGEN) 5 MG tablet Take 1 tablet by mouth twice daily   polyethylene glycol (MIRALAX / GLYCOLAX) 17 g packet Take 17 g by mouth daily. Mix with 8 oz. water or juice   potassium chloride SA (KLOR-CON M) 20 MEQ tablet TAKE 1  BY MOUTH ONCE DAILY (Patient taking differently: Take 20 mEq by mouth daily.)   pregabalin (LYRICA) 100 MG capsule Take 1 capsule (100 mg total) by mouth 2 (two) times daily.   senna-docusate (SENOKOT-S) 8.6-50 MG tablet Take 1 tablet by mouth 2 (two) times daily.   sitaGLIPtin (JANUVIA) 100 MG tablet Take 1 tablet by mouth once daily   No facility-administered encounter medications on file as of 08/29/2022.    Review of Systems  Constitutional:  Negative for appetite change, chills, fatigue, fever and unexpected weight change.  HENT:  Negative for congestion, dental problem, ear discharge, ear pain, facial swelling, hearing loss, nosebleeds, postnasal drip, rhinorrhea, sinus pressure, sinus pain, sneezing, sore throat, tinnitus and trouble swallowing.   Eyes:  Negative for pain, discharge, redness, itching and visual disturbance.  Respiratory:  Negative for cough, chest tightness, shortness of breath and wheezing.   Cardiovascular:  Negative for chest pain, palpitations and leg swelling.  Gastrointestinal:  Positive for abdominal distention and abdominal pain. Negative for blood in stool, constipation, diarrhea, nausea and vomiting.  Genitourinary:  Negative for difficulty urinating, dysuria, flank pain, frequency and urgency.  Musculoskeletal:  Positive for arthralgias and gait problem. Negative for back pain, joint swelling, myalgias, neck pain and neck stiffness.  Skin:  Positive for wound. Negative for pallor and rash.       Left shin area  small open wound   Neurological:  Negative for dizziness, syncope, speech difficulty, weakness, light-headedness, numbness and headaches.  Hematological:  Does not bruise/bleed easily.  Psychiatric/Behavioral:  Negative for agitation, behavioral problems, confusion, hallucinations, self-injury, sleep disturbance and suicidal ideas. The patient is not nervous/anxious.     Immunization History  Administered Date(s) Administered   Fluad Quad(high Dose 65+) 11/08/2018, 11/24/2019, 02/03/2022   Influenza Split  12/26/2010, 01/15/2012   Influenza Whole 12/30/2008, 11/15/2009   Influenza, High Dose Seasonal PF 12/21/2016, 11/05/2017   Influenza,inj,Quad PF,6+ Mos 02/04/2013, 11/03/2013, 12/05/2014, 12/13/2015   Moderna Sars-Covid-2 Vaccination 04/17/2019, 05/15/2019, 12/15/2019   Pneumococcal Conjugate-13 06/12/2014   Pneumococcal Polysaccharide-23 02/15/2009, 12/05/2014   Tdap 12/08/2013   Zoster, Live 01/29/2012   Pertinent  Health Maintenance Due  Topic Date Due   OPHTHALMOLOGY EXAM  Never done   FOOT EXAM  01/28/2020   HEMOGLOBIN A1C  08/05/2022   INFLUENZA VACCINE  09/14/2022   DEXA SCAN  Completed      06/17/2021    2:27 PM 12/09/2021    3:34 PM 02/03/2022    1:44 PM 07/07/2022    1:06 PM 08/29/2022    2:09 PM  Fall Risk  Falls in the past year? 0  1 1 1   Was there an injury with Fall? 0  1 1 1   Fall Risk Category Calculator 0  3 3 3   Fall Risk Category (Retired) Low  High    (RETIRED) Patient Fall Risk Level Low fall risk Moderate fall risk High fall risk    Patient at Risk for Falls Due to No Fall Risks  History of fall(s);Impaired balance/gait;Impaired mobility History of fall(s);Impaired balance/gait History of fall(s);Impaired balance/gait;Impaired mobility  Fall risk Follow up Falls evaluation completed  Falls evaluation completed;Education provided;Falls prevention discussed Falls evaluation completed Falls evaluation completed   Functional Status Survey:    Vitals:    08/29/22 1410  BP: 138/74  Pulse: 70  Temp: (!) 97.1 F (36.2 C)  TempSrc: Temporal  SpO2: 96%  Weight: 136 lb (61.7 kg)  Height: 5' 3.25" (1.607 m)   Body mass index is 23.9 kg/m. Physical Exam Vitals reviewed.  Constitutional:      General: She is not in acute distress.    Appearance: Normal appearance. She is normal weight. She is not ill-appearing or diaphoretic.  HENT:     Head: Normocephalic.     Right Ear: Tympanic membrane, ear canal and external ear normal. There is no impacted cerumen.     Left Ear: Tympanic membrane, ear canal and external ear normal. There is no impacted cerumen.     Nose: Nose normal. No congestion or rhinorrhea.     Mouth/Throat:     Mouth: Mucous membranes are moist.     Pharynx: Oropharynx is clear. No oropharyngeal exudate or posterior oropharyngeal erythema.  Eyes:     General: No scleral icterus.       Right eye: No discharge.        Left eye: No discharge.     Extraocular Movements: Extraocular movements intact.     Conjunctiva/sclera: Conjunctivae normal.     Pupils: Pupils are equal, round, and reactive to light.  Neck:     Vascular: No carotid bruit.  Cardiovascular:     Rate and Rhythm: Normal rate and regular rhythm.     Pulses: Normal pulses.     Heart sounds: Normal heart sounds. No murmur heard.    No friction rub. No gallop.  Pulmonary:     Effort: Pulmonary effort is normal. No respiratory distress.     Breath sounds: Normal breath sounds. No wheezing, rhonchi or rales.  Chest:     Chest wall: No tenderness.  Abdominal:     General: There is distension.     Palpations: Abdomen is rigid. There is no mass.     Tenderness: There is no abdominal tenderness. There is no right CVA  tenderness, left CVA tenderness, guarding or rebound.  Musculoskeletal:        General: No swelling or tenderness. Normal range of motion.     Cervical back: Normal range of motion. No rigidity or tenderness.     Right lower leg: No edema.     Left  lower leg: No edema.  Lymphadenopathy:     Cervical: No cervical adenopathy.  Skin:    General: Skin is warm and dry.     Coloration: Skin is not pale.     Findings: No bruising, lesion or rash.     Comments: Left shin area less than dime size,wound bed red with > 2 cm diffuse surrounding erythema and tender to touch.   Neurological:     Mental Status: She is alert. Mental status is at baseline.     Cranial Nerves: No cranial nerve deficit.     Sensory: No sensory deficit.     Motor: No weakness.     Coordination: Coordination normal.     Gait: Gait abnormal.  Psychiatric:        Mood and Affect: Mood normal.        Speech: Speech normal.        Behavior: Behavior normal.        Cognition and Memory: She exhibits impaired recent memory.    Labs reviewed: Recent Labs    06/16/22 0429 07/07/22 1427 08/03/22 1454  NA 137 135 137  K 3.5 4.2 4.0  CL 103 97* 99  CO2 27 32 26  GLUCOSE 94 98 106*  BUN 15 22 16   CREATININE 0.70 0.70 0.72  CALCIUM 9.5 10.1 9.8  MG 2.3  --   --   PHOS 3.5  --   --    Recent Labs    06/15/22 1314 06/16/22 0429 08/03/22 1454  AST 41 38 47*  ALT 52* 50* 61*  ALKPHOS 126 120 125  BILITOT 0.7 0.5 0.8  PROT 7.4 6.5 7.1  ALBUMIN 3.9 3.5 3.8   Recent Labs    02/03/22 1419 06/15/22 1314 06/16/22 0429 07/07/22 1427 08/03/22 1454  WBC 5.8   < > 6.1 4.9 5.6  NEUTROABS 3,619  --   --  2,695 3.0  HGB 12.8   < > 11.7* 11.8 12.0  HCT 38.3   < > 35.5* 35.9 37.0  MCV 94.3   < > 94.2 92.8 94.1  PLT 189   < > 193 206 202   < > = values in this interval not displayed.   Lab Results  Component Value Date   TSH 2.112 06/16/2022   Lab Results  Component Value Date   HGBA1C 6.2 (H) 02/03/2022   Lab Results  Component Value Date   CHOL 160 02/03/2022   HDL 48 (L) 02/03/2022   LDLCALC 82 02/03/2022   LDLDIRECT 65.7 07/08/2010   TRIG 201 (H) 02/03/2022   CHOLHDL 3.3 02/03/2022    Significant Diagnostic Results in last 30 days:  No  results found.  Assessment/Plan  1. Pain of upper abdomen Abdomen distended,firm to palpation but non tender. - PDMP reviewed  - refill oxycodone  - oxyCODONE (OXY IR/ROXICODONE) 5 MG immediate release tablet; Take 1 tablet (5 mg total) by mouth every 12 (twelve) hours as needed for moderate pain, breakthrough pain or severe pain.  Dispense: 60 tablet; Refill: 0  2. Cellulitis of left lower extremity Left shin area less than dime size,wound bed red with > 2 cm diffuse surrounding erythema and  tender to touch.  - start on clindamycin  - clindamycin (CLEOCIN) 150 MG capsule; Take 1 capsule (150 mg total) by mouth 3 (three) times daily for 7 days.  Dispense: 21 capsule; Refill: 0  3. Liver mass Had CT/MRI 06/2022  Referred to oncologist but on chart review appointment canceled by POA  She was seen by palliative care during hospitalization but unclear if she was discharged on palliative/hospice care.Care giver will verify with daughter then up date provider if referral required.  - continue with pain management.   Family/ staff Communication: Reviewed plan of care with patient and care giver verbalized understanding   Labs/tests ordered: None   Next Appointment: Return if symptoms worsen or fail to improve.   Caesar Bookman, NP

## 2022-09-03 NOTE — Assessment & Plan Note (Signed)
Continue with supportive care.

## 2022-09-15 ENCOUNTER — Other Ambulatory Visit: Payer: Self-pay | Admitting: Family

## 2022-09-15 DIAGNOSIS — G6289 Other specified polyneuropathies: Secondary | ICD-10-CM

## 2022-09-15 DIAGNOSIS — M545 Low back pain, unspecified: Secondary | ICD-10-CM

## 2022-09-15 NOTE — Telephone Encounter (Signed)
Patient is requesting a refill of the following medications: Requested Prescriptions   Pending Prescriptions Disp Refills   pregabalin (LYRICA) 100 MG capsule [Pharmacy Med Name: Pregabalin 100 MG Oral Capsule] 60 capsule 5    Sig: Take 1 capsule by mouth twice daily    Date of last refill: 06/19/22  Refill amount: 10 pills   Treatment agreement date: Not on file for Lyrica and no pending appointment  Message sent to administrative pool to contact patient for a routine visit

## 2022-09-28 ENCOUNTER — Other Ambulatory Visit: Payer: Self-pay | Admitting: Family

## 2022-09-28 NOTE — Telephone Encounter (Signed)
Patient has request refill on medication Xanax. Medication last refilled 08/23/2022. Patient has Non Opioid Contract on file dated 12/17/2020. No upcoming appointments. Medication pend and sent to PCP Ngetich, Donalee Citrin, NP for approval.

## 2022-09-29 ENCOUNTER — Telehealth: Payer: Self-pay

## 2022-09-29 NOTE — Telephone Encounter (Signed)
Will need evaluation either video or office visit.

## 2022-09-29 NOTE — Telephone Encounter (Signed)
Called and spoke with [place]. She will check with daughter about appointment and call back Monday to schedule.

## 2022-09-29 NOTE — Telephone Encounter (Signed)
Patient';s caregiver, Wynona Canes called stating that spots have reappeared on patient's legs. They are red and seem to be inflamed. She states that when patient was taking tha= antibiotic legs were fine. She would like to know if patient needs to come in or if another round of antibiotic can be sent to pharmacy.

## 2022-10-03 ENCOUNTER — Encounter: Payer: Self-pay | Admitting: Family

## 2022-10-03 ENCOUNTER — Encounter: Payer: Medicare Other | Admitting: Family

## 2022-10-03 ENCOUNTER — Other Ambulatory Visit: Payer: Self-pay

## 2022-10-03 DIAGNOSIS — G6289 Other specified polyneuropathies: Secondary | ICD-10-CM

## 2022-10-03 NOTE — Telephone Encounter (Signed)
Patient daughter Iness Fazzone called and states that both medications Cymbalta need refills. She also had a question about Loratadine. She states that medication can cause issues with liver and since patient has liver cancer should this medication be continued? Message routed to PCP Ngetich, Donalee Citrin, NP

## 2022-10-03 NOTE — Telephone Encounter (Signed)
May discontinue loratadine or use as needed.  Verify Cymbalta dosage 30 mg or 60 mg capsule ?

## 2022-10-03 NOTE — Progress Notes (Signed)
This encounter was created in error - please disregard. Error unable to log on video. Appt rescheduled

## 2022-10-05 MED ORDER — DULOXETINE HCL 30 MG PO CPEP: 30.0000 mg | ORAL_CAPSULE | Freq: Every day | ORAL | 1 refills | Status: DC

## 2022-10-05 MED ORDER — DULOXETINE HCL 60 MG PO CPEP: 60.0000 mg | ORAL_CAPSULE | Freq: Every day | ORAL | 1 refills | Status: DC

## 2022-10-05 NOTE — Telephone Encounter (Signed)
Please refer to previous message. Daughter states she takes both.

## 2022-10-06 ENCOUNTER — Telehealth (INDEPENDENT_AMBULATORY_CARE_PROVIDER_SITE_OTHER): Payer: Medicare Other | Admitting: Family

## 2022-10-06 DIAGNOSIS — L03116 Cellulitis of left lower limb: Secondary | ICD-10-CM | POA: Diagnosis not present

## 2022-10-06 MED ORDER — CLINDAMYCIN HCL 150 MG PO CAPS
150.0000 mg | ORAL_CAPSULE | Freq: Three times a day (TID) | ORAL | 0 refills | Status: AC
Start: 2022-10-06 — End: 2022-10-13

## 2022-10-06 MED ORDER — SACCHAROMYCES BOULARDII 250 MG PO CAPS: 250.0000 mg | ORAL_CAPSULE | Freq: Two times a day (BID) | ORAL | 0 refills | Status: AC

## 2022-10-06 NOTE — Progress Notes (Signed)
This service is provided via telemedicine  No vital signs collected/recorded due to the encounter was a telemedicine visit.   Location of patient (ex: home, work):  Home   Patient consents to a telephone visit:  Yes, 11/13/2019  Location of the provider (ex: office, home):  [facility]  Name of any referring provider: Jeffry Vogelsang, Donalee Citrin, NP   Names of all persons participating in the telemedicine service and their role in the encounter: Bethany B/CMA,Caliann Leckrone C, NP , and patient  Time spent on call:  11 minutes      Provider: Tripp Goins FNP-C  Clary Boulais, Donalee Citrin, NP  Patient Care Team: Nevah Dalal, Donalee Citrin, NP as PCP - General (Family Medicine) Chrystie Nose, MD as PCP - Cardiology (Cardiology) [name], MD as Consulting Physician (Pulmonary Disease) Freddy Finner, MD (Inactive) as Consulting Physician (Obstetrics and Gynecology) Chrystie Nose, MD as Consulting Physician (Cardiology) Mateo Flow, MD as Consulting Physician (Ophthalmology) York Spaniel, MD (Inactive) as Consulting Physician (Neurology) Marcine Matar, MD as Consulting Physician (Urology) Janalyn Harder, MD (Inactive) as Consulting Physician (Dermatology) Iva Boop, MD as Consulting Physician (Gastroenterology) Arlyss Repress, LCSW as Social Worker Octavia Heir, NP as Nurse Practitioner (Adult Health Nurse Practitioner)  Extended Emergency Contact Information Primary Emergency Contact: Molinda Bailiff Address: [address]          [place], [place] [address] [place] of [place] Phone: [phone] Relation: Daughter Secondary Emergency Contact: Candi Leash [place] of [place] Mobile Phone: [phone] Relation: Other  Code Status:  Full Code  Goals of care: Advanced Directive information    08/29/2022    2:16 PM  Advanced Directives  Does Patient Have a Medical Advance Directive? No  Would patient like  information on creating a medical advance directive? No - Patient declined     Chief Complaint  Patient presents with   Follow-up    f/u issue with legs    HPI:  Pt is a 89 y.o. female seen today for an acute visit for evaluation of redness on the legs.Care giver states no drainage, fever or chills.she was previous treated on 08/29/2022 for cellulitis with improvement.  Past Medical History:  Diagnosis Date   Abnormality of gait    Adenomatous polyp of colon 2002   7mm   Allergic rhinitis    Anxiety    Anxiety and depression    Chronic back pain    Dementia without behavioral disturbance (HCC)    Depression    Diabetes (HCC)    Diverticulosis of colon    Dry eye syndrome    Dysphagia    Dysthymic disorder    Fall    Fibromyalgia    GERD (gastroesophageal reflux disease)    H/O hiatal hernia    History of adverse drug reaction    History of cerebrovascular disease 09/24/2014   History of recurrent UTIs    Hypertension, benign    Irritable bowel syndrome    Low back pain syndrome    Memory loss    Mitral valve prolapse    Paroxysmal A-fib (HCC)    Peripheral neuropathy    "both feet and legs"   Physical deconditioning    Sjogren's syndrome (HCC)    Therapeutic opioid-induced constipation (OIC)    Thyroid nodule    Urinary incontinence    Past Surgical History:  Procedure Laterality Date   ABDOMINAL HYSTERECTOMY  1967   APPENDECTOMY  CARDIAC CATHETERIZATION  02/17/2003   normal L main, LAD free of disease, Cfx free of disease, RCA free of disease (Dr. Mervyn Skeeters. [place])   CATARACT EXTRACTION, BILATERAL     CHOLECYSTECTOMY  2000   COLONOSCOPY W/ BIOPSIES     multiple   DENTAL SURGERY     multiple tooth extractions   ESOPHAGOGASTRODUODENOSCOPY (EGD) WITH ESOPHAGEAL DILATION N/A 08/23/2012   Procedure: ESOPHAGOGASTRODUODENOSCOPY (EGD) WITH ESOPHAGEAL DILATION;  Surgeon: Rachael Fee, MD;  Location: [facility];  Service: Endoscopy;  Laterality: N/A;   NASAL SEPTUM  SURGERY  1980   NM MYOCAR PERF WALL MOTION  2003   persantine - normal static and dynamic study w/apical thinning and presvered LV function, no ischemia   SINUS EXPLORATION     ossifiying fibroma   TEMPOROMANDIBULAR JOINT SURGERY  1986   Dr. Warren Danes   TRANSTHORACIC ECHOCARDIOGRAM  2001   mild LVH, normal LV    Allergies  Allergen Reactions   Banana Nausea And Vomiting   Codeine Nausea Only    unless given with Phenergan   Klonopin [Clonazepam] Other (See Comments)    Causes hallucination    Meperidine Hcl Nausea Only    unless given with Phenergan   Norflex [Orphenadrine Citrate] Nausea Only    Unless given with Phenergan   Oxycodone-Acetaminophen Nausea Only    unless given with phenergan   Propoxyphene Hcl Nausea Only    unless given with phenergan   Zoloft [Sertraline Hcl] Other (See Comments)    Caused lethargy   Doxycycline Other (See Comments)    Unknown reaction   Naproxen Other (See Comments)    Unknown reaction   Penicillins Other (See Comments)    Unknown reaction Has patient had a PCN reaction causing immediate rash, facial/tongue/throat swelling, SOB or lightheadedness with hypotension: Unknown Has patient had a PCN reaction causing severe rash involving mucus membranes or skin necrosis: Unknown Has patient had a PCN reaction that required hospitalization: pt was in the hospital at time of reaction Has patient had a PCN reaction occurring within the last 10 years: Unknown If all of the above answers are "NO", then may proceed with Cephalos   Phenothiazines Other (See Comments)    Unknown reaction   Stelazine Other (See Comments)    Unknown reaction   Sulfamethoxazole-Trimethoprim Other (See Comments)    Unknown reaction   Tolectin [Tolmetin Sodium] Other (See Comments)    Unknown reaction   Tramadol Other (See Comments)    Unknown reaction    Outpatient Encounter Medications as of 10/06/2022  Medication Sig   acetaminophen (TYLENOL) 500 MG tablet  Take 1,000 mg by mouth 2 (two) times daily.   ALPRAZolam (XANAX) 0.25 MG tablet Take 1 tablet by mouth twice daily as needed for anxiety   antiseptic oral rinse (BIOTENE) LIQD 15 mLs by Mouth Rinse route as needed.   aspirin 325 MG tablet Take 325 mg by mouth daily. Takes at 5pm.   bisacodyl (DULCOLAX) 5 MG EC tablet Take 1 tablet (5 mg total) by mouth daily as needed for moderate constipation.   Calcium Carb-Cholecalciferol (CALCIUM 600+D) 600-20 MG-MCG TABS Take 600 mcg by mouth daily.   carboxymethylcellulose (REFRESH PLUS) 0.5 % SOLN Place 1 drop into both eyes in the morning, at noon, in the evening, and at bedtime.   CRANBERRY PO Take 2 tablets by mouth daily. AZO   diltiazem (CARDIZEM CD) 180 MG 24 hr capsule Take 1 capsule by mouth once daily   DULoxetine (CYMBALTA) 30 MG  capsule Take 1 capsule (30 mg total) by mouth daily. Takes at 9am   DULoxetine (CYMBALTA) 60 MG capsule Take 1 capsule (60 mg total) by mouth daily. Takes at 5pm.   furosemide (LASIX) 40 MG tablet Take 1 tablet by mouth once daily   glucose blood (ONETOUCH VERIO) test strip USE 1 STRIP TO CHECK GLUCOSE ONCE DAILY   Lancets Micro Thin 33G MISC Use to test blood sugar daily. Dx: E08.65   lansoprazole (PREVACID) 30 MG capsule TAKE 1 CAPSULE BY MOUTH ONCE DAILY AT NOON   LINZESS 290 MCG CAPS capsule TAKE 1 CAPSULE BY MOUTH EVERY DAY AT BEDTIME (Patient taking differently: Take 290 mcg by mouth at bedtime.)   loratadine (CLARITIN) 10 MG tablet Take 1 tablet (10 mg total) by mouth daily.   memantine (NAMENDA) 10 MG tablet Take 1 tablet by mouth twice daily   MYRBETRIQ 50 MG TB24 tablet Take 1 tablet by mouth once daily   oxyCODONE (OXY IR/ROXICODONE) 5 MG immediate release tablet Take 1 tablet (5 mg total) by mouth every 12 (twelve) hours as needed for moderate pain, breakthrough pain or severe pain.   pilocarpine (SALAGEN) 5 MG tablet Take 1 tablet by mouth twice daily   polyethylene glycol (MIRALAX / GLYCOLAX) 17 g packet  Take 17 g by mouth daily. Mix with 8 oz. water or juice   potassium chloride SA (KLOR-CON M) 20 MEQ tablet TAKE 1  BY MOUTH ONCE DAILY (Patient taking differently: Take 20 mEq by mouth daily.)   pregabalin (LYRICA) 100 MG capsule Take 1 capsule by mouth twice daily   senna-docusate (SENOKOT-S) 8.6-50 MG tablet Take 1 tablet by mouth 2 (two) times daily.   sitaGLIPtin (JANUVIA) 100 MG tablet Take 1 tablet by mouth once daily   No facility-administered encounter medications on file as of 10/06/2022.    Review of Systems  Constitutional:  Negative for appetite change, chills, fatigue, fever and unexpected weight change.  Eyes:  Negative for pain, discharge, redness, itching and visual disturbance.  Musculoskeletal:  Positive for gait problem. Negative for arthralgias, back pain, joint swelling, myalgias, neck pain and neck stiffness.  Skin:  Negative for color change, pallor and rash.       Left leg redness   Neurological:  Negative for dizziness, weakness, light-headedness and headaches.  Psychiatric/Behavioral:  Negative for agitation, behavioral problems, confusion, hallucinations and sleep disturbance. The patient is not nervous/anxious.     Immunization History  Administered Date(s) Administered   Fluad Quad(high Dose 65+) 11/08/2018, 11/24/2019, 02/03/2022   Influenza Split 12/26/2010, 01/15/2012   Influenza Whole 12/30/2008, 11/15/2009   Influenza, High Dose Seasonal PF 12/21/2016, 11/05/2017   Influenza,inj,Quad PF,6+ Mos 02/04/2013, 11/03/2013, 12/05/2014, 12/13/2015   Moderna Sars-Covid-2 Vaccination 04/17/2019, 05/15/2019, 12/15/2019   Pneumococcal Conjugate-13 06/12/2014   Pneumococcal Polysaccharide-23 02/15/2009, 12/05/2014   Tdap 12/08/2013   Zoster, Live 01/29/2012   Pertinent  Health Maintenance Due  Topic Date Due   OPHTHALMOLOGY EXAM  Never done   FOOT EXAM  01/28/2020   HEMOGLOBIN A1C  08/05/2022   INFLUENZA VACCINE  09/14/2022   DEXA SCAN  Completed       02/03/2022    1:44 PM 07/07/2022    1:06 PM 08/29/2022    2:09 PM 10/03/2022    1:17 PM 10/06/2022   10:33 AM  Fall Risk  Falls in the past year? 1 1 1  0 0  Was there an injury with Fall? 1 1 1  0 0  Fall Risk Category Calculator 3  3 3 0 0  Fall Risk Category (Retired) High      (RETIRED) Patient Fall Risk Level High fall risk      Patient at Risk for Falls Due to History of fall(s);Impaired balance/gait;Impaired mobility History of fall(s);Impaired balance/gait History of fall(s);Impaired balance/gait;Impaired mobility History of fall(s);Impaired balance/gait;Impaired mobility History of fall(s);Impaired balance/gait;Impaired mobility  Fall risk Follow up Falls evaluation completed;Education provided;Falls prevention discussed Falls evaluation completed Falls evaluation completed Falls evaluation completed Falls evaluation completed   Functional Status Survey:    There were no vitals filed for this visit. There is no height or weight on file to calculate BMI. Physical Exam Constitutional:      General: She is not in acute distress.    Appearance: She is not ill-appearing.  Pulmonary:     Effort: Pulmonary effort is normal. No respiratory distress.  Skin:    Findings: Erythema present.     Comments: Left leg shin area erythema  Neurological:     Mental Status: She is alert. Mental status is at baseline.     Gait: Gait abnormal.  Psychiatric:        Mood and Affect: Mood normal.        Behavior: Behavior normal.     Labs reviewed: Recent Labs    06/16/22 0429 07/07/22 1427 08/03/22 1454  NA 137 135 137  K 3.5 4.2 4.0  CL 103 97* 99  CO2 27 32 26  GLUCOSE 94 98 106*  BUN 15 22 16   CREATININE 0.70 0.70 0.72  CALCIUM 9.5 10.1 9.8  MG 2.3  --   --   PHOS 3.5  --   --    Recent Labs    06/15/22 1314 06/16/22 0429 08/03/22 1454  AST 41 38 47*  ALT 52* 50* 61*  ALKPHOS 126 120 125  BILITOT 0.7 0.5 0.8  PROT 7.4 6.5 7.1  ALBUMIN 3.9 3.5 3.8   Recent Labs     02/03/22 1419 06/15/22 1314 06/16/22 0429 07/07/22 1427 08/03/22 1454  WBC 5.8   < > 6.1 4.9 5.6  NEUTROABS 3,619  --   --  2,695 3.0  HGB 12.8   < > 11.7* 11.8 12.0  HCT 38.3   < > 35.5* 35.9 37.0  MCV 94.3   < > 94.2 92.8 94.1  PLT 189   < > 193 206 202   < > = values in this interval not displayed.   Lab Results  Component Value Date   TSH 2.112 06/16/2022   Lab Results  Component Value Date   HGBA1C 6.2 (H) 02/03/2022   Lab Results  Component Value Date   CHOL 160 02/03/2022   HDL 48 (L) 02/03/2022   LDLCALC 82 02/03/2022   LDLDIRECT 65.7 07/08/2010   TRIG 201 (H) 02/03/2022   CHOLHDL 3.3 02/03/2022    Significant Diagnostic Results in last 30 days:  No results found.  Assessment/Plan  Cellulitis of left lower extremity No fever reported  Left leg shin area erythema - start on clindamycin as below - Notify provider if symptoms worsen or fail to improve  - clindamycin (CLEOCIN) 150 MG capsule; Take 1 capsule (150 mg total) by mouth 3 (three) times daily for 7 days.  Dispense: 21 capsule; Refill: 0 - saccharomyces boulardii (FLORASTOR) 250 MG capsule; Take 1 capsule (250 mg total) by mouth 2 (two) times daily for 10 days.  Dispense: 20 capsule; Refill: 0  Family/ staff Communication: Reviewed plan of care with patient verbalized  understanding   Labs/tests ordered: None   Next Appointment: Return if symptoms worsen or fail to improve.  I connected with  [place] Callas on 10/06/2022 by a video enabled telemedicine application and verified that I am speaking with the correct person using two identifiers.   I discussed the limitations of evaluation and management by telemedicine. The patient expressed understanding and agreed to proceed.  Spent 12 minutes of Face to face with patient  >50% time spent counseling; reviewing medical record; labs; and developing future plan of care.   Caesar Bookman, NP

## 2022-10-10 NOTE — Telephone Encounter (Signed)
Medication refilled by PCP Ngetich, Nelda Bucks, NP

## 2022-10-23 ENCOUNTER — Other Ambulatory Visit: Payer: Self-pay | Admitting: *Deleted

## 2022-10-23 DIAGNOSIS — N3281 Overactive bladder: Secondary | ICD-10-CM

## 2022-10-23 MED ORDER — MIRABEGRON ER 50 MG PO TB24
50.0000 mg | ORAL_TABLET | Freq: Every day | ORAL | 1 refills | Status: DC
Start: 2022-10-23 — End: 2023-01-19

## 2022-10-23 NOTE — Telephone Encounter (Signed)
Patient daughter called requesting refill.  Rx sent to pharmacy as requested.

## 2022-10-24 ENCOUNTER — Other Ambulatory Visit: Payer: Self-pay | Admitting: Family

## 2022-10-24 DIAGNOSIS — E119 Type 2 diabetes mellitus without complications: Secondary | ICD-10-CM

## 2022-10-26 ENCOUNTER — Telehealth: Payer: Medicare Other | Admitting: Family

## 2022-10-30 ENCOUNTER — Other Ambulatory Visit: Payer: Self-pay | Admitting: Family

## 2022-10-30 NOTE — Telephone Encounter (Signed)
Patient has request refill on medication Xanax 0.25mg . Patient medication last refilled 09/28/2022. Patient has Contract on file dated 07/13/2022. Medication pend and sent to Abbey Chatters, NP due to PCP Ngetich, Donalee Citrin, NP being out of office.

## 2022-11-07 ENCOUNTER — Other Ambulatory Visit: Payer: Self-pay | Admitting: Family

## 2022-11-07 DIAGNOSIS — I10 Essential (primary) hypertension: Secondary | ICD-10-CM

## 2022-11-11 ENCOUNTER — Other Ambulatory Visit: Payer: Self-pay | Admitting: Family

## 2022-11-11 DIAGNOSIS — G6289 Other specified polyneuropathies: Secondary | ICD-10-CM

## 2022-11-13 ENCOUNTER — Other Ambulatory Visit: Payer: Self-pay | Admitting: *Deleted

## 2022-11-13 DIAGNOSIS — G6289 Other specified polyneuropathies: Secondary | ICD-10-CM

## 2022-11-13 MED ORDER — DULOXETINE HCL 30 MG PO CPEP: 30.0000 mg | ORAL_CAPSULE | Freq: Every day | ORAL | 1 refills | Status: DC

## 2022-11-13 MED ORDER — DULOXETINE HCL 60 MG PO CPEP: 60.0000 mg | ORAL_CAPSULE | Freq: Every day | ORAL | 1 refills | Status: DC

## 2022-11-13 NOTE — Telephone Encounter (Signed)
Patient daughter requested refills.  Pended Rx's and sent to [facility] for approval due to HIGH ALERT Warning.

## 2022-11-13 NOTE — Telephone Encounter (Signed)
High risk or very high risk warning populated when attempting to refill medication. RX request sent to PCP for review and approval if warranted.

## 2022-11-17 ENCOUNTER — Telehealth: Payer: Self-pay

## 2022-11-17 DIAGNOSIS — R1010 Upper abdominal pain, unspecified: Secondary | ICD-10-CM

## 2022-11-17 NOTE — Telephone Encounter (Signed)
Patient daughter Naydelin called and had some questions about her mother. She stated that she needs more Oxycodone. I called back and left voicemail.

## 2022-11-20 ENCOUNTER — Other Ambulatory Visit: Payer: Self-pay

## 2022-11-20 ENCOUNTER — Other Ambulatory Visit: Payer: Self-pay | Admitting: Family

## 2022-11-20 DIAGNOSIS — I10 Essential (primary) hypertension: Secondary | ICD-10-CM

## 2022-11-20 DIAGNOSIS — E119 Type 2 diabetes mellitus without complications: Secondary | ICD-10-CM

## 2022-11-20 DIAGNOSIS — G6289 Other specified polyneuropathies: Secondary | ICD-10-CM

## 2022-11-20 MED ORDER — MEMANTINE HCL 10 MG PO TABS
10.0000 mg | ORAL_TABLET | Freq: Two times a day (BID) | ORAL | 1 refills | Status: DC
Start: 2022-11-20 — End: 2023-04-17

## 2022-11-20 MED ORDER — ALPRAZOLAM 0.25 MG PO TABS: 0.2500 mg | ORAL_TABLET | Freq: Two times a day (BID) | ORAL | 3 refills | Status: DC | PRN

## 2022-11-20 MED ORDER — OXYCODONE HCL 5 MG PO TABS
5.0000 mg | ORAL_TABLET | Freq: Two times a day (BID) | ORAL | 0 refills | Status: DC | PRN
Start: 2022-11-20 — End: 2023-04-17

## 2022-11-20 MED ORDER — FUROSEMIDE 40 MG PO TABS
40.0000 mg | ORAL_TABLET | Freq: Every day | ORAL | 1 refills | Status: DC
Start: 2022-11-20 — End: 2023-04-17

## 2022-11-20 MED ORDER — SITAGLIPTIN PHOSPHATE 100 MG PO TABS
100.0000 mg | ORAL_TABLET | Freq: Every day | ORAL | 1 refills | Status: DC
Start: 2022-11-20 — End: 2023-04-17

## 2022-11-20 NOTE — Telephone Encounter (Signed)
Patient daughter Laconte) called back and stated that [facility] with [facility] stated that she needs too reach out to Dinah Ngetich,NP about taking Oxycodone 5 mg and she was advised that patient can take every 12 hours as needed per Dinah Ngetich,NP directions in patients chart. Aerilynn also requested a refill for the Oxycodone 5 mg.

## 2022-11-20 NOTE — Telephone Encounter (Signed)
Patient's daughter Abeyta) called requesting a refill on Xanax, Lasix, Namenda, Januvia.

## 2022-11-20 NOTE — Telephone Encounter (Signed)
Script send to pharmacy

## 2022-11-21 NOTE — Telephone Encounter (Signed)
Daughter was advised and verbalized understanding.

## 2022-11-24 ENCOUNTER — Other Ambulatory Visit: Payer: Self-pay | Admitting: Family

## 2022-11-24 DIAGNOSIS — K219 Gastro-esophageal reflux disease without esophagitis: Secondary | ICD-10-CM

## 2022-11-24 DIAGNOSIS — K2100 Gastro-esophageal reflux disease with esophagitis, without bleeding: Secondary | ICD-10-CM

## 2022-11-30 ENCOUNTER — Telehealth: Payer: Self-pay

## 2022-11-30 NOTE — Telephone Encounter (Signed)
[facility] representative called requesting verbal orders to refer patient to hospice and confirm Ngetich, Dinah C, NP as the attending with the Hospice doctor providing comfort measures   Per [facility] standing order, verbal order given. Message will be sent to patient's provider as a FYI.

## 2022-11-30 NOTE — Telephone Encounter (Signed)
Noted  

## 2022-12-02 ENCOUNTER — Other Ambulatory Visit: Payer: Self-pay | Admitting: Nurse Practitioner

## 2022-12-02 DIAGNOSIS — K582 Mixed irritable bowel syndrome: Secondary | ICD-10-CM

## 2022-12-12 ENCOUNTER — Ambulatory Visit: Payer: Medicare Other | Admitting: Internal Medicine

## 2023-01-17 ENCOUNTER — Telehealth: Payer: Self-pay

## 2023-01-17 NOTE — Telephone Encounter (Signed)
Noted  

## 2023-01-17 NOTE — Telephone Encounter (Signed)
Holly with [facility] called stating patient with cough and questioned if Dinah would send in a rx or if the hospice doctor should address. I informed Jeanice Lim that patient would need a video visit in order for Dinah to determine a course of treatment, otherwise the hospice provider should address. Jeanice Lim opted to have the hospice doctor further advise.

## 2023-01-18 ENCOUNTER — Ambulatory Visit: Payer: Medicare Other | Admitting: Family

## 2023-01-19 ENCOUNTER — Other Ambulatory Visit: Payer: Self-pay | Admitting: Family

## 2023-01-19 DIAGNOSIS — N3281 Overactive bladder: Secondary | ICD-10-CM

## 2023-04-16 ENCOUNTER — Emergency Department (HOSPITAL_COMMUNITY)

## 2023-04-16 ENCOUNTER — Inpatient Hospital Stay (HOSPITAL_COMMUNITY)
Admission: EM | Admit: 2023-04-16 | Discharge: 2023-04-17 | DRG: 951 | Disposition: A | Discharge status: Hospice | Attending: Internal Medicine | Admitting: Internal Medicine

## 2023-04-16 ENCOUNTER — Other Ambulatory Visit: Payer: Self-pay

## 2023-04-16 ENCOUNTER — Encounter (HOSPITAL_COMMUNITY): Payer: Self-pay

## 2023-04-16 DIAGNOSIS — I5032 Chronic diastolic (congestive) heart failure: Secondary | ICD-10-CM | POA: Diagnosis not present

## 2023-04-16 DIAGNOSIS — I110 Hypertensive heart disease with heart failure: Secondary | ICD-10-CM | POA: Diagnosis present

## 2023-04-16 DIAGNOSIS — C228 Malignant neoplasm of liver, primary, unspecified as to type: Secondary | ICD-10-CM | POA: Diagnosis not present

## 2023-04-16 DIAGNOSIS — R9431 Abnormal electrocardiogram [ECG] [EKG]: Secondary | ICD-10-CM | POA: Diagnosis present

## 2023-04-16 DIAGNOSIS — B952 Enterococcus as the cause of diseases classified elsewhere: Secondary | ICD-10-CM | POA: Diagnosis present

## 2023-04-16 DIAGNOSIS — K7460 Unspecified cirrhosis of liver: Secondary | ICD-10-CM | POA: Diagnosis not present

## 2023-04-16 DIAGNOSIS — E7800 Pure hypercholesterolemia, unspecified: Secondary | ICD-10-CM | POA: Diagnosis present

## 2023-04-16 DIAGNOSIS — I480 Paroxysmal atrial fibrillation: Secondary | ICD-10-CM | POA: Diagnosis present

## 2023-04-16 DIAGNOSIS — M545 Low back pain, unspecified: Secondary | ICD-10-CM | POA: Diagnosis present

## 2023-04-16 DIAGNOSIS — K589 Irritable bowel syndrome without diarrhea: Secondary | ICD-10-CM | POA: Diagnosis present

## 2023-04-16 DIAGNOSIS — F0394 Unspecified dementia, unspecified severity, with anxiety: Secondary | ICD-10-CM | POA: Diagnosis not present

## 2023-04-16 DIAGNOSIS — K5730 Diverticulosis of large intestine without perforation or abscess without bleeding: Secondary | ICD-10-CM | POA: Diagnosis not present

## 2023-04-16 DIAGNOSIS — N179 Acute kidney failure, unspecified: Secondary | ICD-10-CM | POA: Diagnosis present

## 2023-04-16 DIAGNOSIS — Z880 Allergy status to penicillin: Secondary | ICD-10-CM

## 2023-04-16 DIAGNOSIS — N3946 Mixed incontinence: Secondary | ICD-10-CM | POA: Diagnosis present

## 2023-04-16 DIAGNOSIS — G8929 Other chronic pain: Secondary | ICD-10-CM | POA: Diagnosis present

## 2023-04-16 DIAGNOSIS — I10 Essential (primary) hypertension: Secondary | ICD-10-CM | POA: Diagnosis present

## 2023-04-16 DIAGNOSIS — Z0389 Encounter for observation for other suspected diseases and conditions ruled out: Secondary | ICD-10-CM | POA: Diagnosis not present

## 2023-04-16 DIAGNOSIS — C786 Secondary malignant neoplasm of retroperitoneum and peritoneum: Secondary | ICD-10-CM | POA: Diagnosis not present

## 2023-04-16 DIAGNOSIS — Z9049 Acquired absence of other specified parts of digestive tract: Secondary | ICD-10-CM

## 2023-04-16 DIAGNOSIS — Z888 Allergy status to other drugs, medicaments and biological substances status: Secondary | ICD-10-CM

## 2023-04-16 DIAGNOSIS — M3500 Sicca syndrome, unspecified: Secondary | ICD-10-CM | POA: Diagnosis not present

## 2023-04-16 DIAGNOSIS — I959 Hypotension, unspecified: Secondary | ICD-10-CM | POA: Diagnosis not present

## 2023-04-16 DIAGNOSIS — F0393 Unspecified dementia, unspecified severity, with mood disturbance: Secondary | ICD-10-CM | POA: Diagnosis not present

## 2023-04-16 DIAGNOSIS — N390 Urinary tract infection, site not specified: Secondary | ICD-10-CM

## 2023-04-16 DIAGNOSIS — Z515 Encounter for palliative care: Secondary | ICD-10-CM | POA: Diagnosis not present

## 2023-04-16 DIAGNOSIS — R9082 White matter disease, unspecified: Secondary | ICD-10-CM | POA: Diagnosis not present

## 2023-04-16 DIAGNOSIS — Z7984 Long term (current) use of oral hypoglycemic drugs: Secondary | ICD-10-CM

## 2023-04-16 DIAGNOSIS — I4891 Unspecified atrial fibrillation: Secondary | ICD-10-CM | POA: Diagnosis present

## 2023-04-16 DIAGNOSIS — Z87891 Personal history of nicotine dependence: Secondary | ICD-10-CM

## 2023-04-16 DIAGNOSIS — R6520 Severe sepsis without septic shock: Secondary | ICD-10-CM

## 2023-04-16 DIAGNOSIS — K219 Gastro-esophageal reflux disease without esophagitis: Secondary | ICD-10-CM | POA: Diagnosis present

## 2023-04-16 DIAGNOSIS — Z79899 Other long term (current) drug therapy: Secondary | ICD-10-CM

## 2023-04-16 DIAGNOSIS — F32A Depression, unspecified: Secondary | ICD-10-CM | POA: Diagnosis not present

## 2023-04-16 DIAGNOSIS — F0390 Unspecified dementia without behavioral disturbance: Secondary | ICD-10-CM | POA: Diagnosis present

## 2023-04-16 DIAGNOSIS — R55 Syncope and collapse: Secondary | ICD-10-CM | POA: Diagnosis not present

## 2023-04-16 DIAGNOSIS — A419 Sepsis, unspecified organism: Secondary | ICD-10-CM | POA: Diagnosis present

## 2023-04-16 DIAGNOSIS — Z1152 Encounter for screening for COVID-19: Secondary | ICD-10-CM

## 2023-04-16 DIAGNOSIS — R001 Bradycardia, unspecified: Secondary | ICD-10-CM | POA: Diagnosis not present

## 2023-04-16 DIAGNOSIS — K8689 Other specified diseases of pancreas: Secondary | ICD-10-CM | POA: Diagnosis not present

## 2023-04-16 DIAGNOSIS — I700 Atherosclerosis of aorta: Secondary | ICD-10-CM | POA: Diagnosis not present

## 2023-04-16 DIAGNOSIS — Z8673 Personal history of transient ischemic attack (TIA), and cerebral infarction without residual deficits: Secondary | ICD-10-CM

## 2023-04-16 DIAGNOSIS — E872 Acidosis, unspecified: Secondary | ICD-10-CM | POA: Diagnosis present

## 2023-04-16 DIAGNOSIS — R188 Other ascites: Secondary | ICD-10-CM | POA: Diagnosis not present

## 2023-04-16 DIAGNOSIS — Z8249 Family history of ischemic heart disease and other diseases of the circulatory system: Secondary | ICD-10-CM

## 2023-04-16 DIAGNOSIS — Z91018 Allergy to other foods: Secondary | ICD-10-CM

## 2023-04-16 DIAGNOSIS — Z7982 Long term (current) use of aspirin: Secondary | ICD-10-CM

## 2023-04-16 DIAGNOSIS — R519 Headache, unspecified: Secondary | ICD-10-CM | POA: Diagnosis not present

## 2023-04-16 DIAGNOSIS — E1140 Type 2 diabetes mellitus with diabetic neuropathy, unspecified: Secondary | ICD-10-CM | POA: Diagnosis not present

## 2023-04-16 DIAGNOSIS — Z90710 Acquired absence of both cervix and uterus: Secondary | ICD-10-CM

## 2023-04-16 DIAGNOSIS — Z808 Family history of malignant neoplasm of other organs or systems: Secondary | ICD-10-CM

## 2023-04-16 DIAGNOSIS — Z882 Allergy status to sulfonamides status: Secondary | ICD-10-CM

## 2023-04-16 DIAGNOSIS — Z66 Do not resuscitate: Secondary | ICD-10-CM | POA: Diagnosis not present

## 2023-04-16 DIAGNOSIS — Z825 Family history of asthma and other chronic lower respiratory diseases: Secondary | ICD-10-CM

## 2023-04-16 DIAGNOSIS — E119 Type 2 diabetes mellitus without complications: Secondary | ICD-10-CM

## 2023-04-16 DIAGNOSIS — Z800 Family history of malignant neoplasm of digestive organs: Secondary | ICD-10-CM

## 2023-04-16 DIAGNOSIS — R160 Hepatomegaly, not elsewhere classified: Secondary | ICD-10-CM | POA: Diagnosis present

## 2023-04-16 DIAGNOSIS — Z881 Allergy status to other antibiotic agents status: Secondary | ICD-10-CM

## 2023-04-16 DIAGNOSIS — R4182 Altered mental status, unspecified: Secondary | ICD-10-CM | POA: Diagnosis not present

## 2023-04-16 DIAGNOSIS — E1142 Type 2 diabetes mellitus with diabetic polyneuropathy: Secondary | ICD-10-CM

## 2023-04-16 DIAGNOSIS — C784 Secondary malignant neoplasm of small intestine: Secondary | ICD-10-CM | POA: Diagnosis present

## 2023-04-16 DIAGNOSIS — M797 Fibromyalgia: Secondary | ICD-10-CM | POA: Diagnosis present

## 2023-04-16 DIAGNOSIS — F341 Dysthymic disorder: Secondary | ICD-10-CM | POA: Diagnosis present

## 2023-04-16 DIAGNOSIS — Z885 Allergy status to narcotic agent status: Secondary | ICD-10-CM

## 2023-04-16 DIAGNOSIS — Z883 Allergy status to other anti-infective agents status: Secondary | ICD-10-CM

## 2023-04-16 HISTORY — DX: Other cerebral infarction: I63.89

## 2023-04-16 HISTORY — DX: Hypotension, unspecified: I95.9

## 2023-04-16 LAB — COMPREHENSIVE METABOLIC PANEL
ALT: 466 U/L — ABNORMAL HIGH (ref 0–44)
AST: 378 U/L — ABNORMAL HIGH (ref 15–41)
Albumin: 3.4 g/dL — ABNORMAL LOW (ref 3.5–5.0)
Alkaline Phosphatase: 147 U/L — ABNORMAL HIGH (ref 38–126)
Anion gap: 18 — ABNORMAL HIGH (ref 5–15)
BUN: 39 mg/dL — ABNORMAL HIGH (ref 8–23)
CO2: 21 mmol/L — ABNORMAL LOW (ref 22–32)
Calcium: 9.3 mg/dL (ref 8.9–10.3)
Chloride: 98 mmol/L (ref 98–111)
Creatinine, Ser: 2.25 mg/dL — ABNORMAL HIGH (ref 0.44–1.00)
GFR, Estimated: 20 mL/min — ABNORMAL LOW (ref >60)
Glucose, Bld: 177 mg/dL — ABNORMAL HIGH (ref 70–99)
Potassium: 4.1 mmol/L (ref 3.5–5.1)
Sodium: 137 mmol/L (ref 135–145)
Total Bilirubin: 0.6 mg/dL (ref 0.0–1.2)
Total Protein: 6.6 g/dL (ref 6.5–8.1)

## 2023-04-16 LAB — PROTIME-INR
INR: 1.2 (ref 0.8–1.2)
Prothrombin Time: 15.1 s (ref 11.4–15.2)

## 2023-04-16 LAB — URINALYSIS, W/ REFLEX TO CULTURE (INFECTION SUSPECTED)
Bilirubin Urine: NEGATIVE
Glucose, UA: NEGATIVE mg/dL
Hgb urine dipstick: NEGATIVE
Ketones, ur: NEGATIVE mg/dL
Nitrite: NEGATIVE
Protein, ur: 30 mg/dL — AB
Specific Gravity, Urine: 1.017 (ref 1.005–1.030)
WBC, UA: >50 WBC/hpf (ref 0–5)
pH: 5 (ref 5.0–8.0)

## 2023-04-16 LAB — RESP PANEL BY RT-PCR (RSV, FLU A&B, COVID)  RVPGX2
Influenza A by PCR: NEGATIVE
Influenza B by PCR: NEGATIVE
Resp Syncytial Virus by PCR: NEGATIVE
SARS Coronavirus 2 by RT PCR: NEGATIVE

## 2023-04-16 LAB — CBC
HCT: 29.7 % — ABNORMAL LOW (ref 36.0–46.0)
Hemoglobin: 9.4 g/dL — ABNORMAL LOW (ref 12.0–15.0)
MCH: 29.2 pg (ref 26.0–34.0)
MCHC: 31.6 g/dL (ref 30.0–36.0)
MCV: 92.2 fL (ref 80.0–100.0)
Platelets: 202 10*3/uL (ref 150–400)
RBC: 3.22 MIL/uL — ABNORMAL LOW (ref 3.87–5.11)
RDW: 14.7 % (ref 11.5–15.5)
WBC: 13.4 10*3/uL — ABNORMAL HIGH (ref 4.0–10.5)
nRBC: 0 % (ref 0.0–0.2)

## 2023-04-16 LAB — I-STAT CG4 LACTIC ACID, ED
Lactic Acid, Venous: 5.4 mmol/L (ref 0.5–1.9)
Lactic Acid, Venous: 4.2 mmol/L (ref 0.5–1.9)

## 2023-04-16 LAB — AMMONIA: Ammonia: 14 umol/L (ref 9–35)

## 2023-04-16 LAB — APTT: aPTT: 26 s (ref 24–36)

## 2023-04-16 LAB — LIPASE, BLOOD: Lipase: 26 U/L (ref 11–51)

## 2023-04-16 MED ORDER — ONDANSETRON 4 MG PO TBDP: 4.0000 mg | ORAL_TABLET | Freq: Four times a day (QID) | ORAL | Status: DC | PRN

## 2023-04-16 MED ORDER — BIOTENE DRY MOUTH MT LIQD: 15.0000 mL | OROMUCOSAL | Status: DC | PRN

## 2023-04-16 MED ORDER — LACTATED RINGERS IV BOLUS (SEPSIS)
1000.0000 mL | Freq: Once | INTRAVENOUS | Status: AC
  Administered 2023-04-16: 1000 mL via INTRAVENOUS

## 2023-04-16 MED ORDER — ACETAMINOPHEN 650 MG RE SUPP: 650.0000 mg | Freq: Four times a day (QID) | RECTAL | Status: DC | PRN

## 2023-04-16 MED ORDER — POLYVINYL ALCOHOL 1.4 % OP SOLN: 1.0000 [drp] | Freq: Three times a day (TID) | OPHTHALMIC | Status: DC | PRN

## 2023-04-16 MED ORDER — HYDROMORPHONE HCL 1 MG/ML IJ SOLN
0.5000 mg | INTRAMUSCULAR | Status: DC | PRN
Start: 2023-04-16 — End: 2023-04-17
  Administered 2023-04-16 – 2023-04-17 (×3): 1 mg via INTRAVENOUS
  Filled 2023-04-16 (×3): qty 1

## 2023-04-16 MED ORDER — VANCOMYCIN HCL IN DEXTROSE 1-5 GM/200ML-% IV SOLN
1000.0000 mg | Freq: Once | INTRAVENOUS | Status: AC
  Administered 2023-04-16: 1000 mg via INTRAVENOUS
  Filled 2023-04-16: qty 200

## 2023-04-16 MED ORDER — METRONIDAZOLE 500 MG/100ML IV SOLN
500.0000 mg | Freq: Once | INTRAVENOUS | Status: AC
  Administered 2023-04-16: 500 mg via INTRAVENOUS
  Filled 2023-04-16: qty 100

## 2023-04-16 MED ORDER — HALOPERIDOL LACTATE 2 MG/ML PO CONC: 0.5000 mg | ORAL | Status: DC | PRN

## 2023-04-16 MED ORDER — HALOPERIDOL LACTATE 5 MG/ML IJ SOLN
0.5000 mg | INTRAMUSCULAR | Status: DC | PRN
  Administered 2023-04-16: 0.5 mg via INTRAVENOUS
  Filled 2023-04-16: qty 1

## 2023-04-16 MED ORDER — SODIUM CHLORIDE 0.9 % IV SOLN
2.0000 g | Freq: Once | INTRAVENOUS | Status: AC
  Administered 2023-04-16: 2 g via INTRAVENOUS
  Filled 2023-04-16: qty 10

## 2023-04-16 MED ORDER — MAGIC MOUTHWASH: 15.0000 mL | Freq: Four times a day (QID) | ORAL | Status: DC | PRN

## 2023-04-16 MED ORDER — HALOPERIDOL 1 MG PO TABS: 0.5000 mg | ORAL_TABLET | ORAL | Status: DC | PRN

## 2023-04-16 MED ORDER — ACETAMINOPHEN 325 MG PO TABS: 650.0000 mg | ORAL_TABLET | Freq: Four times a day (QID) | ORAL | Status: DC | PRN

## 2023-04-16 MED ORDER — BISACODYL 10 MG RE SUPP: 10.0000 mg | Freq: Every day | RECTAL | Status: DC | PRN

## 2023-04-16 MED ORDER — LACTATED RINGERS IV SOLN: INTRAVENOUS | Status: AC

## 2023-04-16 MED ORDER — SODIUM CHLORIDE 0.9% FLUSH
3.0000 mL | Freq: Two times a day (BID) | INTRAVENOUS | Status: DC
  Administered 2023-04-16 – 2023-04-17 (×2): 3 mL via INTRAVENOUS

## 2023-04-16 MED ORDER — SENNA 8.6 MG PO TABS: 1.0000 | ORAL_TABLET | Freq: Every evening | ORAL | Status: DC | PRN

## 2023-04-16 MED ORDER — ONDANSETRON HCL 4 MG/2ML IJ SOLN: 4.0000 mg | Freq: Four times a day (QID) | INTRAMUSCULAR | Status: DC | PRN

## 2023-04-16 NOTE — ED Notes (Signed)
 Granddaughter wants an update once she has moved. Please contact her if anything changes with her care as well.

## 2023-04-16 NOTE — Sepsis Progress Note (Signed)
 Code sepsis protocol being monitored by eLink.

## 2023-04-16 NOTE — ED Notes (Signed)
 Patient transported to CT

## 2023-04-16 NOTE — Progress Notes (Signed)
 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 Hospice MSW called patients home to schedule visit today and was informed that when paid caregiver arrived, she found patient on the floor. When she attempted to get patient up, patient went limp. EMS was activated and patient was taken to the hospital. Family requested ED workup and then decided upon comfort care. Patient was admitted to [facility] 3.3.2025 with a diagnosis of AKI and Sepsis. Per Dr. Anne Fu with [facility], this is a related hospital admission.  Followed patient throughout day for ED evaluation. When decision was made for admission for comfort/end of life care, contacted daughter Chelbie to discuss transfer to [facility]. Alece was in agreement with transfer and then requested ED case manager to ask me to call her back. Upon return phone call, Parthenia requested I call, goddaughter Irving Burton, as she had questions she would like addressed. Phone call to Emily, [phone], to answer questions and explain about hospice inpatient unit. Irving Burton agreed to transfer to [facility]. Received an additional phone call from Irving Burton inquiring if there was a palliative floor in the hospital. Explained there was no longer a palliative unit but the unit patient was assigned to was where patients are often admitted for EOL care. Irving Burton shared she was not comfortable with patient transferring to [facility] due to location of facility and inquired if another place may be available. Shared location of Hospice Home. Irving Burton consulted family and called back requesting patient remain in hospital for end of life care. Hospital team notified and discharge halted.  V/S: 95.6 Rectal, 133/43, 71, 22, no O2 sats recorded  I/O: not recorded  Abnormal Labs: CO2: 21 (L) Glucose: 177 (H) BUN: 39 (H) Creatinine: 2.25 (H) Anion gap: 18  (H) Alkaline Phosphatase: 147 (H) Albumin: 3.4 (L) AST: 378 (H) ALT: 466 (H) GFR, Estimated: 20 (L) WBC: 13.4 (H) RBC: 3.22 (L) Hemoglobin: 9.4 (L) HCT: 29.7 (L) Lactic Acid: 5.4  Appearance: CLOUDY ! Bilirubin Urine: NEGATIVE Color, Urine: AMBER ! Leukocytes,Ua: LARGE ! Protein: 30 ! Bacteria, UA: MANY ! Hyaline Casts, UA: PRESENT Mucus: PRESENT RBC / HPF: 0-5 Squamous Epithelial / HPF: 0-5 WBC, UA: >50   Diagnostics: PORTABLE CHEST 1 VIEW  IMPRESSION: Underinflation.  Chronic changes.  CT ABDOMEN AND PELVIS WITHOUT CONTRAST  IMPRESSION: 1. Interval increase in size of the large, heterogeneous mass in the inferior aspect of the right lobe of the liver, consistent with the patient's known hepatocellular carcinoma. 2. Interval extracapsular spread of tumor inferiorly in the paracolic gutter and anteriorly along the peritoneal surface anteriorly on the right. 3. Possible additional peritoneal carcinomatosis along the lateral aspect of the spleen, involving the splenic flexure and proximal descending colon. 4. Possible peritoneal carcinomatosis or small primary bladder neoplasm involving the anterior bladder wall. 5. Moderate amount of free peritoneal fluid, primarily in the pelvis. This is medium in density, measuring 30 Hounsfield units, and is suspicious for hemorrhagic or infected fluid. 6. Small segment of collapsed small bowel in the right lateral lower abdomen adjacent to the extracapsular spread of tumor. This portion of the small bowel is surrounded by a fluid and gas-filled cavity measuring 5.0 x 2.4 cm, bordered by peritoneal tumor. This is suspicious for a contained perforation. 7. Mildly dilated, fluid-filled esophagus, likely due to gastroesophageal reflux or achalasia. 8. Stable cardiomegaly. 9. Sigmoid colon diverticulosis.  CT HEAD WITHOUT CONTRAST  IMPRESSION: 1.  No acute intracranial pathology. 2. Unchanged encephalomalacia of the right MCA  territory. 3. Small-vessel white matter disease.  IV/PRN Meds: aztreonam (AZACTAM) 2 g in sodium chloride 0.9 % 100 mL IVPB X 1,lactated ringers bolus 1,000 mL X 2, LR @ 50 cc/hr continuous, metroNIDAZOLE (FLAGYL) IVPB 500 mg X 1, vancomycin (VANCOCIN) IVPB 1000 mg/200 mL X 1, Haldol 0.5 mg IV X 1, Dilaudid 1 mg IV X 2  Problem List as per Lyn Hollingshead Bouie's note 3.3.2025: Assessment/Plan Active Problems:   ANXIETY DEPRESSION   HYPERTENSION, BENIGN   GERD   Irritable bowel syndrome   Fibromyalgia   Hypercholesterolemia   Prolonged QT interval   Mixed stress and urge urinary incontinence   History of CVA (cerebrovascular accident)   PAF (paroxysmal atrial fibrillation) (HCC)   Dementia without behavioral disturbance (HCC)   Bradycardia   Chronic lower back pain   Sjogren's disease (HCC)   A-fib (HCC)   Diabetes mellitus type 2 in nonobese [facility])   Diabetic neuropathy (HCC)   Chronic diastolic heart failure (HCC)   Liver mass   Sepsis (HCC)   AKI > Creatinine elevated to 2.25 from baseline 0.8.  This is in the setting of decreased p.o. intake and abdominal pain. > Received 2 L of IV fluids in the ED and started on rate of fluids before decision to make patient comfort care. - Will continue with fluids for comfort - No further labs   Sepsis > Presentation suspicious for sepsis given hypothermia of 93.3.  Lactic acid 5.4, 4.2.  Leukocytosis to 13.3. > Unknown etiology with unrevealing chest x-ray.  CT head and CT abdomen pelvis ordered in the ED and are pending.  Urinalysis and blood cultures are pending. > Did receive vancomycin, aztreonam, Flagyl in the ED.  Decision made to make patient comfort care. - Hold further antibiotics - Monitor clinical response to interventions thus far - Continue with IV fluids - Discontinue active rewarming   Liver mass > Known liver mass suspected malignancy not pursuing workup.  Following with palliative and hospice outpatient.  Made comfort  care in the ED just prior to admission.  LFTs elevated in ED. -Comfort care measures   Hypertension A-fib Bradycardia Chronic diastolic CHF (last echo in 2024 with EF 60-60%, indeterminate Solik function, normal RV function) - Holding diltiazem and Lasix   Diabetes - Can check if needed   Dementia Anxiety Depression - Holding Namenda, duloxetine, Xanax - As needed Haldol for agitation - As needed Ativan for anxiety   Sjogren's - Continue eyedrops   Chronic pain - As needed Dilaudid   Hyperlipidemia GERD LPR QT prolongation Incontinence IBS Chronic pain - Holding home meds, comfort care measures  Discharge Planning: as above, now hospital death anticipated.  Family Contact: as above  IDT: Updated  Goals of Care: Clear, comfort care, anticipate hospital death  Please call with any hospice related questions or concerns.  Thank you, Haynes Bast, BSN, [facility] Liaison [phone]

## 2023-04-16 NOTE — Care Management (Signed)
 Return message from RN Liaison with [facility], family is agreeable to move forward with transport to  [facility]. Updated ED RN to call report [phone] and [facility] called for transport. No further ED RNCM needs identified.

## 2023-04-16 NOTE — Care Management (Signed)
 Received another message from liaison at [facility] stating that the patient's family has decided against transferring the patient to [facility].  The family expressed concerns about the surrounding neighborhood surrounding the facility. Dr. Alinda Money and ED nursing staff has been updated. Patient will remain at [facility] for End life Care.  Spoke with ED registration concerning the change of  payor source to [facility].

## 2023-04-16 NOTE — ED Notes (Signed)
 Help with a in and out cath on patient. Patient is resting with call bell in reach

## 2023-04-16 NOTE — ED Provider Notes (Signed)
 [place] [facility]PARTMENT AT [facility] Provider Note   CSN: [id number] Arrival date & time: 04/16/23  1042     History  Chief Complaint  Patient presents with   Loss of Consciousness    BRITTIN BELNAP is a 89 y.o. female.  HPI 89 yo female with ho ca and dementia presents to ED. Daughter here from [place] liver ca and dementia 89 yo female with caregivers 7 days a week.  Last ok at 8 pm loc.  Daughter spoke with her over the weekend and she was not hungry.  She still did not want to eat.   Complained of pain in abdomen and no appetite over weekend Discussed with daughter Norina.  Patient has history of metastatic liver cancer and dementia.  She has deteriorated over the past several weeks.  This week and she did not want to eat or drink.  She has been on hospice but has been having ongoing aggressive care.  The daughter is the primary decision maker.  She currently is thinking about end-of-life care and comfort care.  I discussed with her that we will initially evaluate her mother and work towards a diagnosis.  She will think about the comfort care if she is in the interim and we will speak after workup completed    Home Medications Prior to Admission medications   Medication Sig Start Date End Date Taking? Authorizing Provider  acetaminophen (TYLENOL) 500 MG tablet Take 1,000 mg by mouth 2 (two) times daily.    [provider]  ALPRAZolam Prudy Feeler) 0.25 MG tablet Take 1 tablet (0.25 mg total) by mouth 2 (two) times daily as needed. for anxiety 11/20/22   Ngetich, Dinah C, NP  antiseptic oral rinse (BIOTENE) LIQD 15 mLs by Mouth Rinse route as needed.    [provider]  aspirin 325 MG tablet Take 325 mg by mouth daily. Takes at [employer].    [provider]  bisacodyl (DULCOLAX) 5 MG EC tablet Take 1 tablet (5 mg total) by mouth daily as needed for moderate constipation. 06/19/22   Hongalgi, Maximino Greenland, MD  Calcium Carb-Cholecalciferol (CALCIUM 600+D) 600-20  MG-MCG TABS Take 600 mcg by mouth daily.    [provider]  carboxymethylcellulose (REFRESH PLUS) 0.5 % SOLN Place 1 drop into both eyes in the morning, at noon, in the evening, and at bedtime.    [provider]  CRANBERRY PO Take 2 tablets by mouth daily. AZO    [provider]  diltiazem (CARDIZEM CD) 180 MG 24 hr capsule Take 1 capsule by mouth once daily 01/13/22   Hilty, Lisette Abu, MD  DULoxetine (CYMBALTA) 30 MG capsule Take 1 capsule (30 mg total) by mouth daily. Takes at [facility] 11/13/22   Ngetich, Dinah C, NP  DULoxetine (CYMBALTA) 60 MG capsule Take 1 capsule (60 mg total) by mouth daily. Takes at 5pm. 11/13/22   Ngetich, Dinah C, NP  furosemide (LASIX) 40 MG tablet Take 1 tablet (40 mg total) by mouth daily. 11/20/22   Ngetich, Dinah C, NP  glucose blood (ONETOUCH VERIO) test strip USE 1 STRIP TO CHECK GLUCOSE ONCE DAILY 06/27/21   Ngetich, Dinah C, NP  Lancets Micro Thin 33G MISC Use to test blood sugar daily. Dx: E08.65 01/19/21   Ngetich, Dinah C, NP  lansoprazole (PREVACID) 30 MG capsule TAKE 1 CAPSULE BY MOUTH ONCE DAILY AT NOON 11/24/22   Ngetich, Dinah C, NP  LINZESS 290 MCG CAPS capsule TAKE 1 CAPSULE BY MOUTH  EVERY DAY AT BEDTIME 12/04/22   Ngetich, Dinah C, NP  loratadine (CLARITIN) 10 MG tablet Take 1 tablet (10 mg total) by mouth daily. 05/12/20   Fargo, Amy E, NP  memantine (NAMENDA) 10 MG tablet Take 1 tablet (10 mg total) by mouth 2 (two) times daily. 11/20/22   Ngetich, Donalee Citrin, NP  MYRBETRIQ 50 MG TB24 tablet Take 1 tablet by mouth once daily 01/19/23   Ngetich, Dinah C, NP  oxyCODONE (OXY IR/ROXICODONE) 5 MG immediate release tablet Take 1 tablet (5 mg total) by mouth every 12 (twelve) hours as needed for moderate pain, breakthrough pain or severe pain. 11/20/22   Ngetich, Dinah C, NP  pilocarpine (SALAGEN) 5 MG tablet Take 1 tablet by mouth twice daily 04/20/22   Ngetich, Dinah C, NP  polyethylene glycol (MIRALAX / GLYCOLAX) 17 g packet Take 17 g by mouth  daily. Mix with 8 oz. water or juice 06/19/22   Hongalgi, Maximino Greenland, MD  potassium chloride SA (KLOR-CON M) 20 MEQ tablet Take 1 tablet by mouth once daily 11/07/22   Ngetich, Dinah C, NP  pregabalin (LYRICA) 100 MG capsule Take 1 capsule by mouth twice daily 09/15/22   Ngetich, Dinah C, NP  senna-docusate (SENOKOT-S) 8.6-50 MG tablet Take 1 tablet by mouth 2 (two) times daily. 06/19/22   Hongalgi, Maximino Greenland, MD  sitaGLIPtin (JANUVIA) 100 MG tablet Take 1 tablet (100 mg total) by mouth daily. 11/20/22   Ngetich, Dinah C, NP      Allergies    Banana, Codeine, Klonopin [clonazepam], Meperidine hcl, Norflex [orphenadrine citrate], Oxycodone-acetaminophen, Propoxyphene hcl, Zoloft [sertraline hcl], Doxycycline, Naproxen, Penicillins, Phenothiazines, Stelazine, Sulfamethoxazole-trimethoprim, Tolectin [tolmetin sodium], and Tramadol    Review of Systems   Review of Systems  Physical Exam Updated Vital Signs BP (!) 115/53   Pulse 60   Temp (!) 95.6 F (35.3 C) (Rectal)   Resp (!) 21   Ht 1.607 m (5' 3.25")   Wt 60 kg   SpO2 97%   BMI 23.25 kg/m  Physical Exam  ED Results / Procedures / Treatments   Labs (all labs ordered are listed, but only abnormal results are displayed) Labs Reviewed  CBC - Abnormal; Notable for the following components:      Result Value   WBC 13.4 (*)    RBC 3.22 (*)    Hemoglobin 9.4 (*)    HCT 29.7 (*)    All other components within normal limits  COMPREHENSIVE METABOLIC PANEL - Abnormal; Notable for the following components:   CO2 21 (*)    Glucose, Bld 177 (*)    BUN 39 (*)    Creatinine, Ser 2.25 (*)    Albumin 3.4 (*)    AST 378 (*)    ALT 466 (*)    Alkaline Phosphatase 147 (*)    GFR, Estimated 20 (*)    Anion gap 18 (*)    All other components within normal limits  I-STAT CG4 LACTIC ACID, ED - Abnormal; Notable for the following components:   Lactic Acid, Venous 5.4 (*)    All other components within normal limits  I-STAT CG4 LACTIC ACID, ED -  Abnormal; Notable for the following components:   Lactic Acid, Venous 4.2 (*)    All other components within normal limits  RESP PANEL BY RT-PCR (RSV, FLU A&B, COVID)  RVPGX2  CULTURE, BLOOD (ROUTINE X 2)  CULTURE, BLOOD (ROUTINE X 2)  LIPASE, BLOOD  PROTIME-INR  APTT  AMMONIA  URINALYSIS, W/ REFLEX  TO CULTURE (INFECTION SUSPECTED)  I-STAT CG4 LACTIC ACID, ED  I-STAT CG4 LACTIC ACID, ED    EKG EKG Interpretation Date/Time:  Monday April 16 2023 11:59:26 EST Ventricular Rate:  57 PR Interval:    QRS Duration:  104 QT Interval:  489 QTC Calculation: 485 R Axis:   -25  Text Interpretation: Atrial fibrillation Borderline left axis deviation Anteroseptal infarct, age indeterminate Confirmed by Margarita Grizzle [phone]) on 04/16/2023 1:38:02 PM  Radiology DG Chest Port 1 View Result Date: 04/16/2023 CLINICAL DATA:  Questionable sepsis.  Found down EXAM: PORTABLE CHEST 1 VIEW COMPARISON:  X-Jameyah Fennewald 04/23/2020 FINDINGS: Overlapping cardiac leads. Underinflation. No consolidation, pneumothorax or effusion. No edema. Normal cardiopericardial silhouette. Calcified aorta. IMPRESSION: Underinflation.  Chronic changes. Electronically Signed   By: Karen Kays M.D.   On: 04/16/2023 12:37    Procedures .Critical Care  Performed by: Margarita Grizzle, MD Authorized by: Margarita Grizzle, MD   Critical care provider statement:    Critical care time (minutes):  75   Critical care was necessary to treat or prevent imminent or life-threatening deterioration of the following conditions:  Respiratory failure, cardiac failure and circulatory failure   Critical care was time spent personally by me on the following activities:  Development of treatment plan with patient or surrogate, discussions with consultants, evaluation of patient's response to treatment, examination of patient, ordering and review of laboratory studies, ordering and review of radiographic studies, ordering and performing treatments and  interventions, pulse oximetry, re-evaluation of patient's condition and review of old charts     Medications Ordered in ED Medications  lactated ringers infusion ( Intravenous New Bag/Given 04/16/23 1404)  aztreonam (AZACTAM) 2 g in sodium chloride 0.9 % 100 mL IVPB (0 g Intravenous Stopped 04/16/23 1254)  metroNIDAZOLE (FLAGYL) IVPB 500 mg (0 mg Intravenous Stopped 04/16/23 1402)  vancomycin (VANCOCIN) IVPB 1000 mg/200 mL premix (0 mg Intravenous Stopped 04/16/23 1402)  lactated ringers bolus 1,000 mL (0 mLs Intravenous Stopped 04/16/23 1254)    And  lactated ringers bolus 1,000 mL (0 mLs Intravenous Stopped 04/16/23 1402)    ED Course/ Medical Decision Making/ A&P Clinical Course as of 04/16/23 1429  Mon Apr 16, 2023  1331 Creatinine increased to 2.25 from baseline of 0.7 LFTs elevated consistent with known cancer [DR]  1331 CBC reviewed interpreted significant for leukocytosis of 13,400 Hemoglobin 9.4 [DR]  1332 Hemoglobin decreased from first prior and white blood cell count elevated [DR]  1332 INR within normal limits Respiratory panel negative Ammonia normal [DR]    Clinical Course User Index [DR] Margarita Grizzle, MD                                 Medical Decision Making Amount and/or Complexity of Data Reviewed Labs: ordered. Radiology: ordered.  Risk Prescription drug management.  89 year old female who presents today with abdominal pain, decreased appetite, dementia, metastatic liver disease.  She is reported to be on hospice care.  She had several episodes of unresponsiveness today.  She had decreased responsiveness and was reported to be difficult to arouse.  Heart rate was 60 with blood pressure of 100/64 on EMS initial arrival. Patient's evaluation here is indicative of sepsis.  Patient has distended abdomen with known liver cancer. She has leukocytosis and elevated lactic acids with hypothermia.  She has a new kidney injury with creatinine increased to 2.25 and LFTs are  elevated significantly from baseline. I discussed all of  these findings with her daughter. Discussed with daughter Markesha and wishes comfort care at this time.  We had extensive discussion based on risks and benefits.  She states that she does not wish her mother to go to a nursing facility.  She does not wish her mother to currently have aggressive care.  I explained the severity of her situation.  She is in agreement with not proceeding with aggressive care.  She would like her to have comfort care only.  I have discussed that this may Care discussed with Dr. Alinda Money who will see for admission       Final Clinical Impression(s) / ED Diagnoses Final diagnoses:  Syncope and collapse  Sepsis, due to unspecified organism, unspecified whether acute organ dysfunction present [facility])    Rx / DC Orders ED Discharge Orders     None         Margarita Grizzle, MD 04/16/23 1430

## 2023-04-16 NOTE — ED Provider Notes (Signed)
 I was contacted by TOC/SW.  Instead of being admitted and going upstairs the patient has gotten a bed at hospice and will be discharged straight to their care.  Patient stable at time of discharge.   Ariana Logan, DO 04/16/23 1642

## 2023-04-16 NOTE — ED Notes (Signed)
 Pt rectal temp 93.3. Bair hugger placed on pt.

## 2023-04-16 NOTE — ED Notes (Signed)
 Granddaughter wants an update if anything changes with her care. She states that she does not want her to be alone during her last moments. Her number is [phone].

## 2023-04-16 NOTE — ED Notes (Signed)
 X-ray at [facility]

## 2023-04-16 NOTE — H&P (Signed)
 History and Physical   Ariana [name]:[id number] DOB: 08/12/1929 DOA: 04/16/2023  PCP: Caesar Bookman, NP   Patient coming from: Home  Chief Complaint: Found down  HPI: Ariana [name] is a 89 y.o. female with medical history significant of hypertension, hyperlipidemia, GERD, LPR, diabetes, neuropathy, stroke, A-fib, bradycardia, diastolic CHF, QT prolongation, dementia, anxiety, depression, Sjogren's disease, subarachnoid hematoma, mixed incontinence, IBS, chronic pain, fibromyalgia, liver mass presenting after syncopal event at home.  Patient has extensive history as above and has dementia as well as a liver mass which she is not pursuing workup of.  She has been on palliative/hospice outpatient but had been pursuing aggressive treatment otherwise.  Per reports, EMS arrived and patient had been found down but she was able to be helped to the bathroom.  Then she had another episode of loss of consciousness and she was difficult to arouse.  She was alert and oriented to person and place only.  She received 250 cc and route.  In the ED concern for sepsis as below and ongoing discussions with family by EDP about goals of care.  Initially family chose to keep patient DNR/DNI but pursue initial workup.  After further discussion of the workup decision was made to make patient comfort care and to pursue no further aggressive treatments.  ED Course: Vital signs in the ED notable for temperature 93.3, blood pressure in the 100s systolic, heart rate in the 50s to 60s.  Lab workup included CMP with bicarb 0.1, BUN 39, creatinine 2.25, glucose 177, AST 378, ALT 466, alk phos 147.  CBC with leukocytosis of 13.4.,  Hemoglobin 9.4 from baseline of 12.  Lipase normal.  Respiratory panel for flu COVID RSV negative.  Ammonia normal.  Urinalysis, blood culture pending.  CT head and CT abdomen pelvis pending.  Chest x-ray with chronic changes.  Patient received vancomycin, Flagyl, aztreonam in the ED.  Also  received 2 L IV fluids and was started on a rate of IV fluids of 150 cc an hour.  Ultimately patient was made comfort care as above.  Review of Systems: As per HPI otherwise all other systems reviewed and are negative.  Past Medical History:  Diagnosis Date   Abnormality of gait    Acute encephalopathy 12/04/2014   Adenomatous polyp of colon 2002   7mm   Allergic rhinitis    Anxiety    Anxiety and depression    Arterial hypotension    Atrial fibrillation with RVR (HCC) 07/03/2014   Chronic back pain    Dementia without behavioral disturbance (HCC)    Depression    Diabetes (HCC)    Diverticulosis of colon    Dry eye syndrome    Dysphagia    Dysthymic disorder    Fall    Fibromyalgia    GERD (gastroesophageal reflux disease)    H/O hiatal hernia    History of adverse drug reaction    History of cerebrovascular disease 09/24/2014   History of recurrent UTIs    Hypertension, benign    Infarction of parietal lobe (HCC)    Irritable bowel syndrome    Low back pain syndrome    Memory loss    Mitral valve prolapse    Paroxysmal A-fib (HCC)    Peripheral neuropathy    "both feet and legs"   Physical deconditioning    Sjogren's syndrome (HCC)    Therapeutic opioid-induced constipation (OIC)    Thyroid nodule    TIA (transient ischemic attack) 12/17/2014  Urinary incontinence     Past Surgical History:  Procedure Laterality Date   ABDOMINAL HYSTERECTOMY  1967   APPENDECTOMY     CARDIAC CATHETERIZATION  02/17/2003   normal L main, LAD free of disease, Cfx free of disease, RCA free of disease (Dr. Mervyn Skeeters. [place])   CATARACT EXTRACTION, BILATERAL     CHOLECYSTECTOMY  2000   COLONOSCOPY W/ BIOPSIES     multiple   DENTAL SURGERY     multiple tooth extractions   ESOPHAGOGASTRODUODENOSCOPY (EGD) WITH ESOPHAGEAL DILATION N/A 08/23/2012   Procedure: ESOPHAGOGASTRODUODENOSCOPY (EGD) WITH ESOPHAGEAL DILATION;  Surgeon: Rachael Fee, MD;  Location: [facility];  Service: Endoscopy;   Laterality: N/A;   NASAL SEPTUM SURGERY  1980   NM MYOCAR PERF WALL MOTION  2003   persantine - normal static and dynamic study w/apical thinning and presvered LV function, no ischemia   SINUS EXPLORATION     ossifiying fibroma   TEMPOROMANDIBULAR JOINT SURGERY  1986   Dr. Warren Danes   TRANSTHORACIC ECHOCARDIOGRAM  2001   mild LVH, normal LV    Social History  reports that she has quit smoking. She has never used smokeless tobacco. She reports that she does not drink alcohol and does not use drugs.  Allergies  Allergen Reactions   Banana Nausea And Vomiting   Codeine Nausea Only    unless given with Phenergan   Klonopin [Clonazepam] Other (See Comments)    Causes hallucination    Meperidine Hcl Nausea Only    unless given with Phenergan   Norflex [Orphenadrine Citrate] Nausea Only    Unless given with Phenergan   Oxycodone-Acetaminophen Nausea Only    unless given with phenergan   Propoxyphene Hcl Nausea Only    unless given with phenergan   Zoloft [Sertraline Hcl] Other (See Comments)    Caused lethargy   Doxycycline Other (See Comments)    Unknown reaction   Naproxen Other (See Comments)    Unknown reaction   Penicillins Other (See Comments)    Unknown reaction Has patient had a PCN reaction causing immediate rash, facial/tongue/throat swelling, SOB or lightheadedness with hypotension: Unknown Has patient had a PCN reaction causing severe rash involving mucus membranes or skin necrosis: Unknown Has patient had a PCN reaction that required hospitalization: pt was in the hospital at time of reaction Has patient had a PCN reaction occurring within the last 10 years: Unknown If all of the above answers are "NO", then may proceed with Cephalos   Phenothiazines Other (See Comments)    Unknown reaction   Stelazine Other (See Comments)    Unknown reaction   Sulfamethoxazole-Trimethoprim Other (See Comments)    Unknown reaction   Tolectin [Tolmetin Sodium] Other (See  Comments)    Unknown reaction   Tramadol Other (See Comments)    Unknown reaction    Family History  Problem Relation Age of Onset   Heart disease Father        heart attack   Pneumonia Father    Heart attack Mother    Hypertension Mother    Colon cancer Sister    Kidney disease Daughter    Asthma Daughter    Arthritis Daughter 49       osteo,   Heart disease Son 62       stage 3 CHF(Diastolic /Systolic)   Throat cancer Brother    Hypertension Maternal Grandmother   Reviewed on admission  Prior to Admission medications   Medication Sig Start Date End Date Taking? Authorizing  Provider  acetaminophen (TYLENOL) 500 MG tablet Take 1,000 mg by mouth 2 (two) times daily.    [provider]  ALPRAZolam Prudy Feeler) 0.25 MG tablet Take 1 tablet (0.25 mg total) by mouth 2 (two) times daily as needed. for anxiety 11/20/22   Ngetich, Dinah C, NP  antiseptic oral rinse (BIOTENE) LIQD 15 mLs by Mouth Rinse route as needed.    [provider]  aspirin 325 MG tablet Take 325 mg by mouth daily. Takes at [employer].    [provider]  bisacodyl (DULCOLAX) 5 MG EC tablet Take 1 tablet (5 mg total) by mouth daily as needed for moderate constipation. 06/19/22   Hongalgi, Maximino Greenland, MD  Calcium Carb-Cholecalciferol (CALCIUM 600+D) 600-20 MG-MCG TABS Take 600 mcg by mouth daily.    [provider]  carboxymethylcellulose (REFRESH PLUS) 0.5 % SOLN Place 1 drop into both eyes in the morning, at noon, in the evening, and at bedtime.    [provider]  CRANBERRY PO Take 2 tablets by mouth daily. AZO    [provider]  diltiazem (CARDIZEM CD) 180 MG 24 hr capsule Take 1 capsule by mouth once daily 01/13/22   Hilty, Lisette Abu, MD  DULoxetine (CYMBALTA) 30 MG capsule Take 1 capsule (30 mg total) by mouth daily. Takes at [name] [place] 11/13/22   Ngetich, Dinah C, NP  DULoxetine (CYMBALTA) 60 MG capsule Take 1 capsule (60 mg total) by mouth daily. Takes at 5pm. 11/13/22   Ngetich,  Dinah C, NP  furosemide (LASIX) 40 MG tablet Take 1 tablet (40 mg total) by mouth daily. 11/20/22   Ngetich, Dinah C, NP  glucose blood (ONETOUCH VERIO) test strip USE 1 STRIP TO CHECK GLUCOSE ONCE DAILY 06/27/21   Ngetich, Dinah C, NP  Lancets Micro Thin 33G MISC Use to test blood sugar daily. Dx: E08.65 01/19/21   Ngetich, Dinah C, NP  lansoprazole (PREVACID) 30 MG capsule TAKE 1 CAPSULE BY MOUTH ONCE DAILY AT NOON 11/24/22   Ngetich, Dinah C, NP  LINZESS 290 MCG CAPS capsule TAKE 1 CAPSULE BY MOUTH EVERY DAY AT BEDTIME 12/04/22   Ngetich, Dinah C, NP  loratadine (CLARITIN) 10 MG tablet Take 1 tablet (10 mg total) by mouth daily. 05/12/20   Fargo, Amy E, NP  memantine (NAMENDA) 10 MG tablet Take 1 tablet (10 mg total) by mouth 2 (two) times daily. 11/20/22   Ngetich, Donalee Citrin, NP  MYRBETRIQ 50 MG TB24 tablet Take 1 tablet by mouth once daily 01/19/23   Ngetich, Dinah C, NP  oxyCODONE (OXY IR/ROXICODONE) 5 MG immediate release tablet Take 1 tablet (5 mg total) by mouth every 12 (twelve) hours as needed for moderate pain, breakthrough pain or severe pain. 11/20/22   Ngetich, Dinah C, NP  pilocarpine (SALAGEN) 5 MG tablet Take 1 tablet by mouth twice daily 04/20/22   Ngetich, Dinah C, NP  polyethylene glycol (MIRALAX / GLYCOLAX) 17 g packet Take 17 g by mouth daily. Mix with 8 oz. water or juice 06/19/22   Hongalgi, Maximino Greenland, MD  potassium chloride SA (KLOR-CON M) 20 MEQ tablet Take 1 tablet by mouth once daily 11/07/22   Ngetich, Dinah C, NP  pregabalin (LYRICA) 100 MG capsule Take 1 capsule by mouth twice daily 09/15/22   Ngetich, Dinah C, NP  senna-docusate (SENOKOT-S) 8.6-50 MG tablet Take 1 tablet by mouth 2 (two) times daily. 06/19/22   Hongalgi, Maximino Greenland, MD  sitaGLIPtin (JANUVIA) 100 MG tablet Take 1 tablet (100 mg total) by  mouth daily. 11/20/22   Ngetich, Donalee Citrin, NP    Physical Exam: Vitals:   04/16/23 1245 04/16/23 1256 04/16/23 1315 04/16/23 1405  BP: (!) 115/52  (!) 115/53   Pulse: 63  60   Resp: 13   (!) 21   Temp:    (!) 95.6 F (35.3 C)  TempSrc:    Rectal  SpO2: 100%  97%   Weight:  60 kg    Height:  5' 3.25" (1.607 m)      Physical Exam Constitutional:      General: She is not in acute distress.    Appearance: She is ill-appearing.  HENT:     Head: Normocephalic and atraumatic.     Mouth/Throat:     Mouth: Mucous membranes are moist.     Pharynx: Oropharynx is clear.  Eyes:     Extraocular Movements: Extraocular movements intact.     Pupils: Pupils are equal, round, and reactive to light.  Cardiovascular:     Rate and Rhythm: Normal rate and regular rhythm.     Pulses: Normal pulses.     Heart sounds: Normal heart sounds.  Pulmonary:     Effort: Pulmonary effort is normal. No respiratory distress.     Breath sounds: Normal breath sounds.  Abdominal:     General: Bowel sounds are normal. There is no distension.     Palpations: Abdomen is soft.     Tenderness: There is no abdominal tenderness.  Musculoskeletal:        General: No swelling or deformity.  Skin:    General: Skin is warm and dry.     Coloration: Skin is pale.  Neurological:     General: No focal deficit present.     Comments: Oriented to person    Labs on Admission: I have personally reviewed following labs and imaging studies  CBC: Recent Labs  Lab 04/16/23 1105  WBC 13.4*  HGB 9.4*  HCT 29.7*  MCV 92.2  PLT 202    Basic Metabolic Panel: Recent Labs  Lab 04/16/23 1105  NA 137  K 4.1  CL 98  CO2 21*  GLUCOSE 177*  BUN 39*  CREATININE 2.25*  CALCIUM 9.3    GFR: Estimated Creatinine Clearance: 13.6 mL/min (A) (by C-G formula based on SCr of 2.25 mg/dL (H)).  Liver Function Tests: Recent Labs  Lab 04/16/23 1105  AST 378*  ALT 466*  ALKPHOS 147*  BILITOT 0.6  PROT 6.6  ALBUMIN 3.4*    Urine analysis:    Component Value Date/Time   COLORURINE AMBER (A) 04/16/2023 1403   APPEARANCEUR CLOUDY (A) 04/16/2023 1403   APPEARANCEUR Clear 07/08/2015 1241   LABSPEC 1.017  04/16/2023 1403   PHURINE 5.0 04/16/2023 1403   GLUCOSEU NEGATIVE 04/16/2023 1403   GLUCOSEU NEGATIVE 07/14/2011 1312   HGBUR NEGATIVE 04/16/2023 1403   BILIRUBINUR NEGATIVE 04/16/2023 1403   BILIRUBINUR Negative 07/07/2022 1419   BILIRUBINUR Negative 07/08/2015 1241   KETONESUR NEGATIVE 04/16/2023 1403   PROTEINUR 30 (A) 04/16/2023 1403   UROBILINOGEN 0.2 07/07/2022 1419   UROBILINOGEN 0.2 10/06/2016 1451   NITRITE NEGATIVE 04/16/2023 1403   LEUKOCYTESUR LARGE (A) 04/16/2023 1403    Radiological Exams on Admission: DG Chest Port 1 View Result Date: 04/16/2023 CLINICAL DATA:  Questionable sepsis.  Found down EXAM: PORTABLE CHEST 1 VIEW COMPARISON:  X-ray 04/23/2020 FINDINGS: Overlapping cardiac leads. Underinflation. No consolidation, pneumothorax or effusion. No edema. Normal cardiopericardial silhouette. Calcified aorta. IMPRESSION: Underinflation.  Chronic changes. Electronically Signed  By: Karen Kays M.D.   On: 04/16/2023 12:37   EKG: Independently reviewed.  Atrial fibrillation with SVR versus bradycardia with PACs.  Low voltage difficult to interpret.  At 57 bpm.  Assessment/Plan Active Problems:   ANXIETY DEPRESSION   HYPERTENSION, BENIGN   GERD   Irritable bowel syndrome   Fibromyalgia   Hypercholesterolemia   Prolonged QT interval   Mixed stress and urge urinary incontinence   History of CVA (cerebrovascular accident)   PAF (paroxysmal atrial fibrillation) (HCC)   Dementia without behavioral disturbance (HCC)   Bradycardia   Chronic lower back pain   Sjogren's disease (HCC)   A-fib (HCC)   Diabetes mellitus type 2 in nonobese [facility])   Diabetic neuropathy (HCC)   Chronic diastolic heart failure (HCC)   Liver mass   Sepsis (HCC)   AKI > Creatinine elevated to 2.25 from baseline 0.8.  This is in the setting of decreased p.o. intake and abdominal pain. > Received 2 L of IV fluids in the ED and started on rate of fluids before decision to make patient comfort  care. - Will continue with fluids for comfort - No further labs  Sepsis > Presentation suspicious for sepsis given hypothermia of 93.3.  Lactic acid 5.4, 4.2.  Leukocytosis to 13.3. > Unknown etiology with unrevealing chest x-ray.  CT head and CT abdomen pelvis ordered in the ED and are pending.  Urinalysis and blood cultures are pending. > Did receive vancomycin, aztreonam, Flagyl in the ED.  Decision made to make patient comfort care. - Hold further antibiotics - Monitor clinical response to interventions thus far - Continue with IV fluids - Discontinue active rewarming  Liver mass > Known liver mass suspected malignancy not pursuing workup.  Following with palliative and hospice outpatient.  Made comfort care in the ED just prior to admission.  LFTs elevated in ED. -Comfort care measures  Hypertension A-fib Bradycardia Chronic diastolic CHF (last echo in 2024 with EF 60-60%, indeterminate Solik function, normal RV function) - Holding diltiazem and Lasix  Diabetes - Can check if needed  Dementia Anxiety Depression - Holding Namenda, duloxetine, Xanax - As needed Haldol for agitation - As needed Ativan for anxiety  Sjogren's - Continue eyedrops  Chronic pain - As needed Dilaudid  Hyperlipidemia GERD LPR QT prolongation Incontinence IBS Chronic pain - Holding home meds, comfort care measures  DVT prophylaxis: None Code Status:   DNR/DNI, comfort care Family Communication:  Daughter updated by phone Disposition Plan:   Patient is from:  Home  Anticipated DC to:  In hospital death versus home hospice versus residential hospice  Anticipated DC date:  1 to 5 days  Anticipated DC barriers: None  Consults called:  Palliative medicine Admission status:  Observation, palliative  Severity of Illness: The appropriate patient status for this patient is OBSERVATION. Observation status is judged to be reasonable and necessary in order to provide the required intensity  of service to ensure the patient's safety. The patient's presenting symptoms, physical exam findings, and initial radiographic and laboratory data in the context of their medical condition is felt to place them at decreased risk for further clinical deterioration. Furthermore, it is anticipated that the patient will be medically stable for discharge from the hospital within 2 midnights of admission.    Synetta Fail MD [facility]  How to contact the [facility] Attending or Consulting provider 7A - 7P or covering provider during after hours 7P -7A, for this patient?   Check the care  team in [facility] and look for a) attending/consulting [facility] provider listed and b) the [facility] team listed Log into [email or web address] and use [place]'s universal password to access. If you do not have the password, please contact the hospital operator. Locate the [facility] provider you are looking for under [facility] and page to a number that you can be directly reached. If you still have difficulty reaching the provider, please page the [facility] (Director on Call) for the Hospitalists listed on amion for assistance.  04/16/2023, 2:55 PM

## 2023-04-16 NOTE — Care Management (Signed)
 ED RN Case Manager contacted daughter Shaneice Barsanti [phone] with updated plans to facilitate transport to [facility], she requested to speak with [facility] liaison once again. Sent secure chat message. Awaiting update from [facility] before setting up transportation.

## 2023-04-16 NOTE — Care Management (Signed)
 EDRN Case Manager received call from [facility] with [facility] concerning family requesting that patient be transferred to [facility] for End of Life Care. Patient is active with [facility],.  Updated EDP and Isabelle Course ED RN with transitional care plan for transport to [facility].

## 2023-04-16 NOTE — ED Triage Notes (Signed)
 Pt BIB [facility] from home after being found on ground with LOC by home caregiver. Pt is on hospice care and has hx of alzheimer's and cancer. Per EMS she was awake on arrival and they helped her to restroom where she had another episode of LOC and was difficult to arouse. Per EMS pt is AO to person and place only. NS given by EMS. VSS with HR 60 and manual BP 100/64 per EMS.

## 2023-04-17 DIAGNOSIS — N390 Urinary tract infection, site not specified: Secondary | ICD-10-CM

## 2023-04-17 DIAGNOSIS — E7800 Pure hypercholesterolemia, unspecified: Secondary | ICD-10-CM | POA: Diagnosis present

## 2023-04-17 DIAGNOSIS — Z66 Do not resuscitate: Secondary | ICD-10-CM | POA: Diagnosis present

## 2023-04-17 DIAGNOSIS — R55 Syncope and collapse: Secondary | ICD-10-CM | POA: Diagnosis present

## 2023-04-17 DIAGNOSIS — Z1152 Encounter for screening for COVID-19: Secondary | ICD-10-CM | POA: Diagnosis not present

## 2023-04-17 DIAGNOSIS — E1140 Type 2 diabetes mellitus with diabetic neuropathy, unspecified: Secondary | ICD-10-CM | POA: Diagnosis present

## 2023-04-17 DIAGNOSIS — F32A Depression, unspecified: Secondary | ICD-10-CM | POA: Diagnosis present

## 2023-04-17 DIAGNOSIS — Z515 Encounter for palliative care: Secondary | ICD-10-CM | POA: Diagnosis not present

## 2023-04-17 DIAGNOSIS — E872 Acidosis, unspecified: Secondary | ICD-10-CM | POA: Diagnosis present

## 2023-04-17 DIAGNOSIS — I110 Hypertensive heart disease with heart failure: Secondary | ICD-10-CM | POA: Diagnosis present

## 2023-04-17 DIAGNOSIS — M3500 Sicca syndrome, unspecified: Secondary | ICD-10-CM | POA: Diagnosis present

## 2023-04-17 DIAGNOSIS — C784 Secondary malignant neoplasm of small intestine: Secondary | ICD-10-CM | POA: Diagnosis present

## 2023-04-17 DIAGNOSIS — A419 Sepsis, unspecified organism: Secondary | ICD-10-CM

## 2023-04-17 DIAGNOSIS — N179 Acute kidney failure, unspecified: Secondary | ICD-10-CM | POA: Diagnosis present

## 2023-04-17 DIAGNOSIS — K219 Gastro-esophageal reflux disease without esophagitis: Secondary | ICD-10-CM | POA: Diagnosis present

## 2023-04-17 DIAGNOSIS — I5032 Chronic diastolic (congestive) heart failure: Secondary | ICD-10-CM | POA: Diagnosis present

## 2023-04-17 DIAGNOSIS — F0393 Unspecified dementia, unspecified severity, with mood disturbance: Secondary | ICD-10-CM | POA: Diagnosis present

## 2023-04-17 DIAGNOSIS — M797 Fibromyalgia: Secondary | ICD-10-CM | POA: Diagnosis present

## 2023-04-17 DIAGNOSIS — B952 Enterococcus as the cause of diseases classified elsewhere: Secondary | ICD-10-CM | POA: Diagnosis present

## 2023-04-17 DIAGNOSIS — F0394 Unspecified dementia, unspecified severity, with anxiety: Secondary | ICD-10-CM | POA: Diagnosis present

## 2023-04-17 DIAGNOSIS — C228 Malignant neoplasm of liver, primary, unspecified as to type: Secondary | ICD-10-CM | POA: Diagnosis present

## 2023-04-17 DIAGNOSIS — C786 Secondary malignant neoplasm of retroperitoneum and peritoneum: Secondary | ICD-10-CM | POA: Diagnosis present

## 2023-04-17 DIAGNOSIS — Z7984 Long term (current) use of oral hypoglycemic drugs: Secondary | ICD-10-CM | POA: Diagnosis not present

## 2023-04-17 DIAGNOSIS — I480 Paroxysmal atrial fibrillation: Secondary | ICD-10-CM | POA: Diagnosis present

## 2023-04-17 DIAGNOSIS — G8929 Other chronic pain: Secondary | ICD-10-CM | POA: Diagnosis present

## 2023-04-17 NOTE — Discharge Summary (Signed)
 Physician Discharge Summary   Patient: Ariana [name] MRN: [id number] DOB: November 26, 1929  Admit date:     04/16/2023  Discharge date: 04/17/23  Discharge Physician: MDALA-GAUSI, Gwenette Greet   PCP: Caesar Bookman, NP   Recommendations at discharge:   Inpatient hospice care  Discharge Diagnoses: Principal Problem:   Sepsis [facility]) Active Problems:   ANXIETY DEPRESSION   HYPERTENSION, BENIGN   GERD   Irritable bowel syndrome   Fibromyalgia   Hypercholesterolemia   Prolonged QT interval   Mixed stress and urge urinary incontinence   History of CVA (cerebrovascular accident)   PAF (paroxysmal atrial fibrillation) (HCC)   Dementia without behavioral disturbance (HCC)   Bradycardia   Chronic lower back pain   Sjogren's disease (HCC)   A-fib (HCC)   Diabetes mellitus type 2 in nonobese (HCC)   Diabetic neuropathy (HCC)   Chronic diastolic heart failure (HCC)   Liver mass  Resolved Problems:   * No resolved hospital problems. *  Hospital Course: 89 year old woman with PMH of hypertension, hyperlipidemia, GERD, LPR, diabetes, neuropathy, stroke, A-fib, bradycardia, diastolic CHF, QT prolongation, dementia, anxiety, depression, Sjogren's disease, subarachnoid hematoma, mixed incontinence, IBS, chronic pain, fibromyalgia, liver mass who presented  after a syncopal event at home.   Patient was brought to the ED by EMS.  She received IV fluids en route to the hospital.  Found to be hypothermic on arrival.  Started on broad-spectrum antibiotics for sepsis.  Family subsequently decided to make the patient comfort care.  Antibiotics were discontinued.  The hospital course is in problem-based format below:    Assessment and Plan:  Syncope on presentation Likely due to hypotension in the setting of sepsis. Patient was treated with IV fluids en route to hospital.  Sepsis, present on admission Evidenced by transient hypotension and lactic acidosis in the setting of infection. Patient  was initially started on antibiotics, but these were discontinued when she was transition to comfort care status.  Urinary tract infection Urine cultures grew Enterococcus faecium. Patient initially treated with aztreonam, metronidazole, vancomycin on presentation. Antibiotics discontinued with transition to comfort care status.  Metastatic cancer Patient with known liver mass suspicious for malignancy and not pursuing workup as outpatient. CT abdomen was done on presentation showed a large liver mass as well as metastatic spread to the peritoneum, bladder, small bowel. Family reported patient has been in pain for a while. Patient was started on as needed IV Dilaudid while admitted.  Pain management will be continued at hospice.  End-of-life care Given the patient's diagnosis of metastatic cancer and poor prognosis, family decided to transition her to comfort care. Hospice was consulted and the patient has been accepted to [facility].  Discharging to [facility] this afternoon.  Chronic conditions: Hypertension, atrial fibrillation, dementia, anxiety, depression, Sjogren's, GERD, hyperlipidemia, IBS, chronic pain All home medications have been discontinued and patient will be managed per hospitalist protocol.  Consultants: n/a Procedures performed: n/a  Disposition: Hospice care Diet recommendation:  Discharge Diet Orders (From admission, onward)     Start     Ordered   04/17/23 0000  Diet general        04/17/23 1429           Regular diet for comfort . DISCHARGE MEDICATION: Allergies as of 04/17/2023       Reactions   Banana Nausea And Vomiting   Codeine Nausea Only   unless given with Phenergan   Klonopin [clonazepam] Other (See Comments)   Causes hallucination  Meperidine Hcl Nausea Only   unless given with Phenergan   Norflex [orphenadrine Citrate] Nausea Only   Unless given with Phenergan   Oxycodone-acetaminophen Nausea Only   unless given with phenergan    Propoxyphene Hcl Nausea Only   unless given with phenergan   Zoloft [sertraline Hcl] Other (See Comments)   Caused lethargy   Doxycycline Other (See Comments)   Unknown reaction   Naproxen Other (See Comments)   Unknown reaction   Penicillins Other (See Comments)   Unknown reaction Has patient had a PCN reaction causing immediate rash, facial/tongue/throat swelling, SOB or lightheadedness with hypotension: Unknown Has patient had a PCN reaction causing severe rash involving mucus membranes or skin necrosis: Unknown Has patient had a PCN reaction that required hospitalization: pt was in the hospital at time of reaction Has patient had a PCN reaction occurring within the last 10 years: Unknown If all of the above answers are "NO", then may proceed with Cephalos   Phenothiazines Other (See Comments)   Unknown reaction   Stelazine Other (See Comments)   Unknown reaction   Sulfamethoxazole-trimethoprim Other (See Comments)   Unknown reaction   Tolectin [tolmetin Sodium] Other (See Comments)   Unknown reaction   Tramadol Other (See Comments)   Unknown reaction        Medication List     STOP taking these medications    acetaminophen 500 MG tablet Commonly known as: TYLENOL   ALPRAZolam 0.25 MG tablet Commonly known as: XANAX   antiseptic oral rinse Liqd   aspirin 325 MG tablet   bisacodyl 5 MG EC tablet Commonly known as: DULCOLAX   Calcium 600+D 600-20 MG-MCG Tabs Generic drug: Calcium Carb-Cholecalciferol   carboxymethylcellulose 0.5 % Soln Commonly known as: REFRESH PLUS   CRANBERRY PO   diltiazem 180 MG 24 hr capsule Commonly known as: CARDIZEM CD   DULoxetine 30 MG capsule Commonly known as: CYMBALTA   DULoxetine 60 MG capsule Commonly known as: CYMBALTA   furosemide 40 MG tablet Commonly known as: LASIX   Lancets Micro Thin 33G Misc   lansoprazole 30 MG capsule Commonly known as: PREVACID   Linzess 290 MCG Caps capsule Generic drug:  linaclotide   loratadine 10 MG tablet Commonly known as: CLARITIN   memantine 10 MG tablet Commonly known as: NAMENDA   Myrbetriq 50 MG Tb24 tablet Generic drug: mirabegron ER   OneTouch Verio test strip Generic drug: glucose blood   oxyCODONE 5 MG immediate release tablet Commonly known as: Oxy IR/ROXICODONE   pilocarpine 5 MG tablet Commonly known as: SALAGEN   polyethylene glycol 17 g packet Commonly known as: MIRALAX / GLYCOLAX   potassium chloride SA 20 MEQ tablet Commonly known as: KLOR-CON M   pregabalin 100 MG capsule Commonly known as: LYRICA   senna-docusate 8.6-50 MG tablet Commonly known as: Senokot-S   sitaGLIPtin 100 MG tablet Commonly known as: Januvia        Discharge Exam: Filed Weights   04/16/23 1256  Weight: 60 kg   Physical Exam on Day of Discharge   General: Awake, confused Oral cavity: moist mucous membranes  Neck: supple  Chest: clear to auscultation. No crackles, no wheezes  CVS: S1,S2 RRR. No murmurs  Abd: Firm Extr: Pedal edema    Condition at discharge: serious  The results of significant diagnostics from this hospitalization (including imaging, microbiology, ancillary and laboratory) are listed below for reference.   Imaging Studies: CT ABDOMEN PELVIS WO CONTRAST Result Date: 04/16/2023 CLINICAL DATA:  Acute,  non localized abdominal pain. Hepatocellular carcinoma associated with cirrhosis of the liver. EXAM: CT ABDOMEN AND PELVIS WITHOUT CONTRAST TECHNIQUE: Multidetector CT imaging of the abdomen and pelvis was performed following the standard protocol without IV contrast. RADIATION DOSE REDUCTION: This exam was performed according to the departmental dose-optimization program which includes automated exposure control, adjustment of the mA and/or kV according to patient size and/or use of iterative reconstruction technique. COMPARISON:  06/15/2022 FINDINGS: Lower chest: Stable enlarged heart. Mild bilateral dependent atelectasis.  Hepatobiliary: Cholecystectomy clips. Previously noted changes of cirrhosis of the liver. Interval increase in size of the large, heterogeneous mass in the inferior aspect of the right lobe of the liver this previously measured 9.9 x 9.4 cm in the axial plane and currently measures 13.0 x 12.7 cm on image number 50/3. This has a progressive partially surrounding rind of medium density tumor which has extended beyond the liver capsule inferiorly in the paracolic gutter and anteriorly along the peritoneal surface anteriorly on the right. Pancreas: Marked diffuse pancreatic atrophy. Spleen: Normal in size without focal abnormality. Adrenals/Urinary Tract: Unremarkable adrenal glands, kidneys and ureters. Interval small enhancing mass in the anterior bladder wall on images 74 and 75/3. Stomach/Bowel: Small segment of collapsed bowel in the right lateral lower abdomen adjacent to the extracapsular spread of tumor. This portion of the small bowel is surrounded by a fluid and gas-filled cavity measuring 5.0 x 2.4 cm, bordered by peritoneal tumor on image number 61/3. There is also a suggestion of peritoneal tumor partially encasing the splenic flexure and proximal descending colon, adherent to the spleen. Mildly dilated, fluid-filled esophagus. Unremarkable stomach. Surgically absent appendix. Multiple sigmoid colon diverticula. Vascular/Lymphatic: No significant vascular findings are present. No enlarged abdominal or pelvic lymph nodes. Reproductive: Status post hysterectomy. No adnexal masses. Other: Moderate amount of free peritoneal fluid, primarily in the pelvis. This is medium in density, measuring 30 Hounsfield units. Musculoskeletal: Lumbar and lower thoracic spine degenerative changes. IMPRESSION: 1. Interval increase in size of the large, heterogeneous mass in the inferior aspect of the right lobe of the liver, consistent with the patient's known hepatocellular carcinoma. 2. Interval extracapsular spread of tumor  inferiorly in the paracolic gutter and anteriorly along the peritoneal surface anteriorly on the right. 3. Possible additional peritoneal carcinomatosis along the lateral aspect of the spleen, involving the splenic flexure and proximal descending colon. 4. Possible peritoneal carcinomatosis or small primary bladder neoplasm involving the anterior bladder wall. 5. Moderate amount of free peritoneal fluid, primarily in the pelvis. This is medium in density, measuring 30 Hounsfield units, and is suspicious for hemorrhagic or infected fluid. 6. Small segment of collapsed small bowel in the right lateral lower abdomen adjacent to the extracapsular spread of tumor. This portion of the small bowel is surrounded by a fluid and gas-filled cavity measuring 5.0 x 2.4 cm, bordered by peritoneal tumor. This is suspicious for a contained perforation. 7. Mildly dilated, fluid-filled esophagus, likely due to gastroesophageal reflux or achalasia. 8. Stable cardiomegaly. 9. Sigmoid colon diverticulosis. Electronically Signed   By: Beckie Salts M.D.   On: 04/16/2023 17:03   CT Head Wo Contrast Result Date: 04/16/2023 CLINICAL DATA:  Headache, found down, fall EXAM: CT HEAD WITHOUT CONTRAST TECHNIQUE: Contiguous axial images were obtained from the base of the skull through the vertex without intravenous contrast. RADIATION DOSE REDUCTION: This exam was performed according to the departmental dose-optimization program which includes automated exposure control, adjustment of the mA and/or kV according to patient size and/or use of  iterative reconstruction technique. COMPARISON:  04/23/2020 FINDINGS: Brain: No evidence of acute infarction, hemorrhage, hydrocephalus, extra-axial collection or mass lesion/mass effect. Unchanged encephalomalacia of the right MCA territory. Periventricular and deep white matter hypodensity. Vascular: No hyperdense vessel or unexpected calcification. Skull: Normal. Negative for fracture or focal lesion.  Sinuses/Orbits: No acute finding. Other: None. IMPRESSION: 1. No acute intracranial pathology. 2. Unchanged encephalomalacia of the right MCA territory. 3. Small-vessel white matter disease. Electronically Signed   By: Jearld Lesch M.D.   On: 04/16/2023 16:38   DG Chest Port 1 View Result Date: 04/16/2023 CLINICAL DATA:  Questionable sepsis.  Found down EXAM: PORTABLE CHEST 1 VIEW COMPARISON:  X-ray 04/23/2020 FINDINGS: Overlapping cardiac leads. Underinflation. No consolidation, pneumothorax or effusion. No edema. Normal cardiopericardial silhouette. Calcified aorta. IMPRESSION: Underinflation.  Chronic changes. Electronically Signed   By: Karen Kays M.D.   On: 04/16/2023 12:37    Microbiology: Results for orders placed or performed during the hospital encounter of 04/16/23  Blood culture (routine x 2)     Status: None (Preliminary result)   Collection Time: 04/16/23 11:40 AM   Specimen: BLOOD  Result Value Ref Range Status   Specimen Description BLOOD RIGHT ANTECUBITAL  Final   Special Requests   Final    BOTTLES DRAWN AEROBIC AND ANAEROBIC Blood Culture adequate volume   Culture   Final    NO GROWTH < 24 HOURS Performed at [facility], [address], [place], [place] [address]    Report Status PENDING  Incomplete  Resp panel by RT-PCR (RSV, Flu A&B, Covid) Anterior Nasal Swab     Status: None   Collection Time: 04/16/23 12:00 PM   Specimen: Anterior Nasal Swab  Result Value Ref Range Status   SARS Coronavirus 2 by RT PCR NEGATIVE NEGATIVE Final   Influenza A by PCR NEGATIVE NEGATIVE Final   Influenza B by PCR NEGATIVE NEGATIVE Final    Comment: (NOTE) The Xpert Xpress SARS-CoV-2/FLU/RSV plus assay is intended as an aid in the diagnosis of influenza from Nasopharyngeal swab specimens and should not be used as a sole basis for treatment. Nasal washings and aspirates are unacceptable for Xpert Xpress SARS-CoV-2/FLU/RSV testing.  Fact Sheet for  Patients: BloggerCourse.com  Fact Sheet for Healthcare Providers: SeriousBroker.it  This test is not yet approved or cleared by the [place] FDA and has been authorized for detection and/or diagnosis of SARS-CoV-2 by FDA under an Emergency Use Authorization (EUA). This EUA will remain in effect (meaning this test can be used) for the duration of the COVID-19 declaration under Section 564(b)(1) of the Act, 21 U.S.C. section 360bbb-3(b)(1), unless the authorization is terminated or revoked.     Resp Syncytial Virus by PCR NEGATIVE NEGATIVE Final    Comment: (NOTE) Fact Sheet for Patients: BloggerCourse.com  Fact Sheet for Healthcare Providers: SeriousBroker.it  This test is not yet approved or cleared by the [place] FDA and has been authorized for detection and/or diagnosis of SARS-CoV-2 by FDA under an Emergency Use Authorization (EUA). This EUA will remain in effect (meaning this test can be used) for the duration of the COVID-19 declaration under Section 564(b)(1) of the Act, 21 U.S.C. section 360bbb-3(b)(1), unless the authorization is terminated or revoked.  Performed at [facility], [address] [place], [place] [address]   Blood culture (routine x 2)     Status: None (Preliminary result)   Collection Time: 04/16/23  1:03 PM   Specimen: BLOOD RIGHT ARM  Result Value  Ref Range Status   Specimen Description BLOOD RIGHT ARM  Final   Special Requests   Final    BOTTLES DRAWN AEROBIC AND ANAEROBIC Blood Culture results may not be optimal due to an inadequate volume of blood received in culture bottles   Culture   Final    NO GROWTH < 24 HOURS Performed at [facility], [address], [place], [place] [address]    Report Status PENDING  Incomplete  Urine Culture     Status: Abnormal (Preliminary result)   Collection Time: 04/16/23  2:03 PM    Specimen: Urine, Random  Result Value Ref Range Status   Specimen Description URINE, RANDOM  Final   Special Requests NONE Reflexed from [id number]  Final   Culture (A)  Final    >=100,000 COLONIES/mL ENTEROCOCCUS FAECIUM SUSCEPTIBILITIES TO FOLLOW Performed at [facility], [address], [place], [place] [address]    Report Status PENDING  Incomplete   *Note: Due to a large number of results and/or encounters for the requested time period, some results have not been displayed. A complete set of results can be found in Results Review.    Labs: CBC: Recent Labs  Lab 04/16/23 1105  WBC 13.4*  HGB 9.4*  HCT 29.7*  MCV 92.2  PLT 202   Basic Metabolic Panel: Recent Labs  Lab 04/16/23 1105  NA 137  K 4.1  CL 98  CO2 21*  GLUCOSE 177*  BUN 39*  CREATININE 2.25*  CALCIUM 9.3   Liver Function Tests: Recent Labs  Lab 04/16/23 1105  AST 378*  ALT 466*  ALKPHOS 147*  BILITOT 0.6  PROT 6.6  ALBUMIN 3.4*   CBG: No results for input(s): "GLUCAP" in the last 168 hours.  Discharge time spent: greater than 30 minutes.  Signed: MDALA-GAUSI, Gwenette Greet, MD [facility] 04/17/2023

## 2023-04-17 NOTE — Plan of Care (Signed)
  Problem: Role Relationship: Goal: Family's ability to cope with current situation will improve Outcome: Progressing   Problem: Pain Management: Goal: Satisfaction with pain management regimen will improve Outcome: Progressing   Problem: Coping: Goal: Level of anxiety will decrease Outcome: Progressing   Problem: Safety: Goal: Ability to remain free from injury will improve Outcome: Progressing

## 2023-04-17 NOTE — Progress Notes (Signed)
 Unable to complete admission documentation dt AMS

## 2023-04-17 NOTE — Plan of Care (Signed)
     Referral previously received for [name] for goals of care discussion. Chart reviewed and updates received from hospice liaison Haynes Bast.   Chart reviewed and patient is established with outpatient hospice through [facility]. The [facility] liaison has been to see the patient and spooken with family. They have elected transfer to inpatient hospice level of care at [facility]. Anticipate d/c to their care later today.   We will sign off at this time. Please contact us and enter a new consult order for any new palliative care needs.  Thank you for your referral and allowing [facility] to assist in Ariana [name]'s care.   Wynne Dust, NP Palliative Medicine Team Phone: [phone]  NO CHARGE

## 2023-04-17 NOTE — Progress Notes (Addendum)
 [name] Liaison Note  Multiple phone calls today with patients daughter Elwanda and Irving Burton to discuss comfort care and possible transfer to [facility].   Irving Burton toured [employer] this morning and is comfortable with moving forward as Marvette had requested yesterday.  Bedside RN please call report to [phone].  Please send completed and signed DNR with patient at discharge.  Addendum: EMS has been called @ 254 pm.  Please call with any questions or concerns.  Thank you, Haynes Bast, BSN, [facility] [phone]

## 2023-04-18 LAB — URINE CULTURE: Culture: >=100000 — AB

## 2023-04-21 LAB — CULTURE, BLOOD (ROUTINE X 2)
Culture: NO GROWTH
Culture: NO GROWTH
Special Requests: ADEQUATE

## 2023-05-15 DEATH — deceased
